# Patient Record
Sex: Female | Born: 1975 | Race: Black or African American | Hispanic: No | Marital: Single | Smoking: Former smoker
Health system: Southern US, Community
[De-identification: ages and names within clinical notes are randomized; demographics above are authoritative.]

## PROBLEM LIST (undated history)

## (undated) DIAGNOSIS — I10 Essential (primary) hypertension: Secondary | ICD-10-CM

## (undated) DIAGNOSIS — C189 Malignant neoplasm of colon, unspecified: Secondary | ICD-10-CM

## (undated) DIAGNOSIS — N200 Calculus of kidney: Secondary | ICD-10-CM

## (undated) DIAGNOSIS — C801 Malignant (primary) neoplasm, unspecified: Secondary | ICD-10-CM

## (undated) HISTORY — PX: URETERAL STENT PLACEMENT: SHX822

---

## 2017-10-06 DIAGNOSIS — I10 Essential (primary) hypertension: Secondary | ICD-10-CM

## 2017-10-06 HISTORY — DX: Essential (primary) hypertension: I10

## 2018-01-07 ENCOUNTER — Encounter (HOSPITAL_COMMUNITY): Payer: Self-pay | Admitting: Emergency Medicine

## 2018-01-07 ENCOUNTER — Other Ambulatory Visit: Payer: Self-pay

## 2018-01-07 ENCOUNTER — Emergency Department (HOSPITAL_COMMUNITY): Payer: Medicaid Other

## 2018-01-07 ENCOUNTER — Inpatient Hospital Stay (HOSPITAL_COMMUNITY)
Admission: EM | Admit: 2018-01-07 | Discharge: 2018-01-10 | DRG: 872 | Disposition: A | Discharge status: Home / Self Care | Payer: Medicaid Other | Attending: Internal Medicine | Admitting: Internal Medicine

## 2018-01-07 ENCOUNTER — Inpatient Hospital Stay (HOSPITAL_COMMUNITY): Payer: Medicaid Other

## 2018-01-07 DIAGNOSIS — K5900 Constipation, unspecified: Secondary | ICD-10-CM | POA: Diagnosis present

## 2018-01-07 DIAGNOSIS — R739 Hyperglycemia, unspecified: Secondary | ICD-10-CM | POA: Diagnosis present

## 2018-01-07 DIAGNOSIS — Z833 Family history of diabetes mellitus: Secondary | ICD-10-CM | POA: Diagnosis not present

## 2018-01-07 DIAGNOSIS — Z1619 Resistance to other specified beta lactam antibiotics: Secondary | ICD-10-CM | POA: Diagnosis present

## 2018-01-07 DIAGNOSIS — I10 Essential (primary) hypertension: Secondary | ICD-10-CM | POA: Diagnosis present

## 2018-01-07 DIAGNOSIS — E876 Hypokalemia: Secondary | ICD-10-CM | POA: Diagnosis present

## 2018-01-07 DIAGNOSIS — N200 Calculus of kidney: Secondary | ICD-10-CM | POA: Diagnosis present

## 2018-01-07 DIAGNOSIS — N390 Urinary tract infection, site not specified: Secondary | ICD-10-CM

## 2018-01-07 DIAGNOSIS — N10 Acute tubulo-interstitial nephritis: Secondary | ICD-10-CM | POA: Diagnosis present

## 2018-01-07 DIAGNOSIS — B964 Proteus (mirabilis) (morganii) as the cause of diseases classified elsewhere: Secondary | ICD-10-CM | POA: Diagnosis present

## 2018-01-07 DIAGNOSIS — E869 Volume depletion, unspecified: Secondary | ICD-10-CM | POA: Diagnosis present

## 2018-01-07 DIAGNOSIS — R9431 Abnormal electrocardiogram [ECG] [EKG]: Secondary | ICD-10-CM

## 2018-01-07 DIAGNOSIS — F17210 Nicotine dependence, cigarettes, uncomplicated: Secondary | ICD-10-CM | POA: Diagnosis present

## 2018-01-07 DIAGNOSIS — A419 Sepsis, unspecified organism: Principal | ICD-10-CM | POA: Diagnosis present

## 2018-01-07 DIAGNOSIS — N12 Tubulo-interstitial nephritis, not specified as acute or chronic: Secondary | ICD-10-CM

## 2018-01-07 DIAGNOSIS — R000 Tachycardia, unspecified: Secondary | ICD-10-CM | POA: Diagnosis present

## 2018-01-07 DIAGNOSIS — R1084 Generalized abdominal pain: Secondary | ICD-10-CM | POA: Diagnosis present

## 2018-01-07 DIAGNOSIS — Z79899 Other long term (current) drug therapy: Secondary | ICD-10-CM

## 2018-01-07 DIAGNOSIS — Z720 Tobacco use: Secondary | ICD-10-CM | POA: Diagnosis present

## 2018-01-07 HISTORY — DX: Essential (primary) hypertension: I10

## 2018-01-07 HISTORY — DX: Abnormal electrocardiogram (ECG) (EKG): R94.31

## 2018-01-07 LAB — PROTIME-INR
INR: 1.2
Prothrombin Time: 15.1 seconds (ref 11.4–15.2)

## 2018-01-07 LAB — CBC WITH DIFFERENTIAL/PLATELET
Abs Immature Granulocytes: 0.17 10*3/uL — ABNORMAL HIGH (ref 0.00–0.07)
Basophils Absolute: 0 10*3/uL (ref 0.0–0.1)
Basophils Relative: 0 %
EOS ABS: 0.1 10*3/uL (ref 0.0–0.5)
Eosinophils Relative: 0 %
HCT: 41.2 % (ref 36.0–46.0)
Hemoglobin: 13.6 g/dL (ref 12.0–15.0)
Immature Granulocytes: 1 %
Lymphocytes Relative: 7 %
Lymphs Abs: 1.6 10*3/uL (ref 0.7–4.0)
MCH: 30.7 pg (ref 26.0–34.0)
MCHC: 33 g/dL (ref 30.0–36.0)
MCV: 93 fL (ref 80.0–100.0)
Monocytes Absolute: 1.3 10*3/uL — ABNORMAL HIGH (ref 0.1–1.0)
Monocytes Relative: 6 %
Neutro Abs: 19.1 10*3/uL — ABNORMAL HIGH (ref 1.7–7.7)
Neutrophils Relative %: 86 %
Platelets: 278 10*3/uL (ref 150–400)
RBC: 4.43 MIL/uL (ref 3.87–5.11)
RDW: 12.8 % (ref 11.5–15.5)
WBC: 22.2 10*3/uL — ABNORMAL HIGH (ref 4.0–10.5)
nRBC: 0 % (ref 0.0–0.2)

## 2018-01-07 LAB — URINALYSIS, ROUTINE W REFLEX MICROSCOPIC
Bilirubin Urine: NEGATIVE
GLUCOSE, UA: NEGATIVE mg/dL
Ketones, ur: 5 mg/dL — AB
NITRITE: NEGATIVE
Protein, ur: >=300 mg/dL — AB
RBC / HPF: >50 RBC/hpf — ABNORMAL HIGH (ref 0–5)
Specific Gravity, Urine: 1.02 (ref 1.005–1.030)
WBC, UA: >50 WBC/hpf — ABNORMAL HIGH (ref 0–5)
pH: 6 (ref 5.0–8.0)

## 2018-01-07 LAB — COMPREHENSIVE METABOLIC PANEL
ALK PHOS: 81 U/L (ref 38–126)
ALT: 14 U/L (ref 0–44)
AST: 17 U/L (ref 15–41)
Albumin: 4.2 g/dL (ref 3.5–5.0)
Anion gap: 14 (ref 5–15)
BUN: 11 mg/dL (ref 6–20)
CALCIUM: 9.5 mg/dL (ref 8.9–10.3)
CO2: 25 mmol/L (ref 22–32)
CREATININE: 0.72 mg/dL (ref 0.44–1.00)
Chloride: 97 mmol/L — ABNORMAL LOW (ref 98–111)
GFR calc Af Amer: >60 mL/min (ref >60)
GFR calc non Af Amer: >60 mL/min (ref >60)
Glucose, Bld: 146 mg/dL — ABNORMAL HIGH (ref 70–99)
Potassium: 2.9 mmol/L — ABNORMAL LOW (ref 3.5–5.1)
Sodium: 136 mmol/L (ref 135–145)
Total Bilirubin: 0.9 mg/dL (ref 0.3–1.2)
Total Protein: 8.1 g/dL (ref 6.5–8.1)

## 2018-01-07 LAB — I-STAT BETA HCG BLOOD, ED (MC, WL, AP ONLY): I-stat hCG, quantitative: <5 m[IU]/mL (ref <5)

## 2018-01-07 LAB — MAGNESIUM: Magnesium: 2 mg/dL (ref 1.7–2.4)

## 2018-01-07 LAB — I-STAT CG4 LACTIC ACID, ED: LACTIC ACID, VENOUS: 0.93 mmol/L (ref 0.5–1.9)

## 2018-01-07 LAB — APTT: aPTT: 32 seconds (ref 24–36)

## 2018-01-07 LAB — MRSA PCR SCREENING: MRSA by PCR: NEGATIVE

## 2018-01-07 LAB — ECHOCARDIOGRAM COMPLETE

## 2018-01-07 LAB — LIPASE, BLOOD: Lipase: 34 U/L (ref 11–51)

## 2018-01-07 LAB — PHOSPHORUS: Phosphorus: 3.3 mg/dL (ref 2.5–4.6)

## 2018-01-07 LAB — POC OCCULT BLOOD, ED: Fecal Occult Bld: NEGATIVE

## 2018-01-07 MED ORDER — ACETAMINOPHEN 650 MG RE SUPP
650.0000 mg | Freq: Four times a day (QID) | RECTAL | Status: DC | PRN
  Administered 2018-01-08: 650 mg via RECTAL
  Filled 2018-01-07: qty 1

## 2018-01-07 MED ORDER — KETOROLAC TROMETHAMINE 30 MG/ML IJ SOLN
30.0000 mg | Freq: Once | INTRAMUSCULAR | Status: AC
  Administered 2018-01-07: 30 mg via INTRAVENOUS
  Filled 2018-01-07: qty 1

## 2018-01-07 MED ORDER — IBUPROFEN 200 MG PO TABS
600.0000 mg | ORAL_TABLET | Freq: Once | ORAL | Status: AC
  Administered 2018-01-07: 600 mg via ORAL
  Filled 2018-01-07: qty 3

## 2018-01-07 MED ORDER — HYDRALAZINE HCL 20 MG/ML IJ SOLN
10.0000 mg | INTRAMUSCULAR | Status: DC | PRN
  Administered 2018-01-07 (×2): 10 mg via INTRAVENOUS
  Filled 2018-01-07 (×2): qty 1

## 2018-01-07 MED ORDER — MORPHINE SULFATE (PF) 4 MG/ML IV SOLN
4.0000 mg | Freq: Once | INTRAVENOUS | Status: AC
  Administered 2018-01-07: 4 mg via INTRAVENOUS
  Filled 2018-01-07: qty 1

## 2018-01-07 MED ORDER — POTASSIUM CHLORIDE IN NACL 40-0.9 MEQ/L-% IV SOLN
INTRAVENOUS | Status: AC
  Administered 2018-01-07: 125 mL/h via INTRAVENOUS
  Filled 2018-01-07 (×2): qty 1000

## 2018-01-07 MED ORDER — HEPARIN SODIUM (PORCINE) 5000 UNIT/ML IJ SOLN
5000.0000 [IU] | Freq: Three times a day (TID) | INTRAMUSCULAR | Status: DC
  Administered 2018-01-07 – 2018-01-10 (×8): 5000 [IU] via SUBCUTANEOUS
  Filled 2018-01-07 (×8): qty 1

## 2018-01-07 MED ORDER — SODIUM CHLORIDE 0.9 % IV BOLUS
500.0000 mL | Freq: Once | INTRAVENOUS | Status: AC
  Administered 2018-01-07: 500 mL via INTRAVENOUS

## 2018-01-07 MED ORDER — SODIUM CHLORIDE 0.9 % IV BOLUS
2000.0000 mL | Freq: Once | INTRAVENOUS | Status: AC
  Administered 2018-01-07: 2000 mL via INTRAVENOUS

## 2018-01-07 MED ORDER — SODIUM CHLORIDE 0.9 % IV SOLN
1.0000 g | Freq: Once | INTRAVENOUS | Status: AC
  Administered 2018-01-07: 1 g via INTRAVENOUS
  Filled 2018-01-07: qty 10

## 2018-01-07 MED ORDER — SODIUM CHLORIDE (PF) 0.9 % IJ SOLN
INTRAMUSCULAR | Status: AC
  Filled 2018-01-07: qty 50

## 2018-01-07 MED ORDER — PROMETHAZINE HCL 25 MG/ML IJ SOLN
12.5000 mg | INTRAMUSCULAR | Status: DC | PRN
  Administered 2018-01-07: 25 mg via INTRAVENOUS
  Administered 2018-01-07 – 2018-01-10 (×4): 12.5 mg via INTRAVENOUS
  Filled 2018-01-07 (×6): qty 1

## 2018-01-07 MED ORDER — METOCLOPRAMIDE HCL 5 MG/ML IJ SOLN
5.0000 mg | Freq: Once | INTRAMUSCULAR | Status: AC
  Administered 2018-01-07: 5 mg via INTRAVENOUS
  Filled 2018-01-07: qty 2

## 2018-01-07 MED ORDER — HYDRALAZINE HCL 20 MG/ML IJ SOLN
10.0000 mg | Freq: Once | INTRAMUSCULAR | Status: AC
  Administered 2018-01-07: 10 mg via INTRAVENOUS
  Filled 2018-01-07: qty 1

## 2018-01-07 MED ORDER — SODIUM CHLORIDE 0.9 % IV SOLN
2.0000 g | INTRAVENOUS | Status: DC
  Administered 2018-01-08 – 2018-01-09 (×2): 2 g via INTRAVENOUS
  Filled 2018-01-07 (×2): qty 2

## 2018-01-07 MED ORDER — MORPHINE SULFATE (PF) 4 MG/ML IV SOLN
4.0000 mg | INTRAVENOUS | Status: DC | PRN
  Administered 2018-01-08: 1 mg via INTRAVENOUS
  Administered 2018-01-08: 4 mg via INTRAVENOUS
  Filled 2018-01-07 (×3): qty 1

## 2018-01-07 MED ORDER — IOPAMIDOL (ISOVUE-300) INJECTION 61%
INTRAVENOUS | Status: AC
  Filled 2018-01-07: qty 100

## 2018-01-07 MED ORDER — SODIUM CHLORIDE 0.9 % IV SOLN
1.0000 g | Freq: Once | INTRAVENOUS | Status: AC
  Administered 2018-01-07: 1 g via INTRAVENOUS
  Filled 2018-01-07: qty 1

## 2018-01-07 MED ORDER — SODIUM CHLORIDE 0.9 % IV BOLUS
1000.0000 mL | Freq: Once | INTRAVENOUS | Status: AC
  Administered 2018-01-07: 1000 mL via INTRAVENOUS

## 2018-01-07 MED ORDER — FAMOTIDINE IN NACL 20-0.9 MG/50ML-% IV SOLN
20.0000 mg | Freq: Once | INTRAVENOUS | Status: AC
  Administered 2018-01-07: 20 mg via INTRAVENOUS
  Filled 2018-01-07: qty 50

## 2018-01-07 MED ORDER — ONDANSETRON HCL 4 MG/2ML IJ SOLN
4.0000 mg | Freq: Once | INTRAMUSCULAR | Status: AC
  Administered 2018-01-07: 4 mg via INTRAVENOUS
  Filled 2018-01-07: qty 2

## 2018-01-07 MED ORDER — POTASSIUM CHLORIDE 10 MEQ/100ML IV SOLN
10.0000 meq | INTRAVENOUS | Status: AC
  Administered 2018-01-07 (×4): 10 meq via INTRAVENOUS
  Filled 2018-01-07 (×4): qty 100

## 2018-01-07 MED ORDER — ACETAMINOPHEN 325 MG PO TABS
650.0000 mg | ORAL_TABLET | Freq: Four times a day (QID) | ORAL | Status: DC | PRN
  Administered 2018-01-07 – 2018-01-09 (×5): 650 mg via ORAL
  Filled 2018-01-07 (×5): qty 2

## 2018-01-07 MED ORDER — METOPROLOL TARTRATE 5 MG/5ML IV SOLN
2.5000 mg | Freq: Once | INTRAVENOUS | Status: AC
  Administered 2018-01-07: 2.5 mg via INTRAVENOUS
  Filled 2018-01-07: qty 5

## 2018-01-07 MED ORDER — IOPAMIDOL (ISOVUE-300) INJECTION 61%
100.0000 mL | Freq: Once | INTRAVENOUS | Status: AC | PRN
  Administered 2018-01-07: 100 mL via INTRAVENOUS

## 2018-01-07 MED ORDER — PROMETHAZINE HCL 25 MG/ML IJ SOLN
12.5000 mg | Freq: Once | INTRAMUSCULAR | Status: AC
  Administered 2018-01-07: 12.5 mg via INTRAVENOUS
  Filled 2018-01-07: qty 1

## 2018-01-07 MED ORDER — MAGNESIUM SULFATE 2 GM/50ML IV SOLN
2.0000 g | Freq: Once | INTRAVENOUS | Status: AC
  Administered 2018-01-07: 2 g via INTRAVENOUS
  Filled 2018-01-07: qty 50

## 2018-01-07 MED ORDER — AMLODIPINE BESYLATE 10 MG PO TABS
10.0000 mg | ORAL_TABLET | Freq: Every day | ORAL | Status: DC
  Administered 2018-01-07 – 2018-01-10 (×4): 10 mg via ORAL
  Filled 2018-01-07 (×4): qty 1

## 2018-01-07 NOTE — ED Notes (Signed)
Patient ambulatory to restroom with minimal assist.

## 2018-01-07 NOTE — Progress Notes (Signed)
Pt spiked a fever of 102.4 with HR in the 140s and feeling very nauseas. Tylenol not due until 0030. On call mid-level was paged and put in orders for a 500cc NS bolus, 600mg  of ibuprofen, and 2.5mg  of metoprolol. All orders carried out and ice packs applied. Will continue to monitor.

## 2018-01-07 NOTE — H&P (Signed)
History and Physical    April [name] PPJ:[id number] DOB: 1975-07-10 DOA: 01/07/2018  PCP: Patient, No Pcp Per   Patient coming from: Home.  I have personally briefly reviewed patient's old medical records in [place]  Chief Complaint: Abdominal pain and vomiting.  HPI: April [name] is a 42 y.o. female with medical history significant of hypertension, tobacco use who is coming to the emergency department with complaints of abdominal and flank pain since Saturday, associated with constipation, which was followed by loose stools with bloody mucus contents.  She has been having dysuria and frequency.  She has also have fever and over 20 episodes of vomiting in the past 3 days.  Vomit content is just clear liquid.  She she is tired, fatigue and malaise.  She has not being able to sleep much and has not been able to eat in the past 3 days.  She has coronal headache.  She denies dyspnea, chest pain, but complains of palpitations, dizziness and diaphoresis.  No PND, orthopnea or recent pitting edema of the lower extremities.  She denies polyuria, polydipsia, polyphagia or blurred vision.  ED Course: Initial vital signs temperature 100.4 F,, later he was 103.2 F, initial pulse 136, respirations 20, blood pressure 171/127 mmHg and O2 sat 98% on room air.  The patient received at least 2000 mL NS bolus, morphine 4 mg IVP, ceftriaxone 1 g IVPB, Zofran 4 mg IVP x1, metoclopramide 5 mg IVP x1 and I added hydralazine 10 mg IVP x1 and Toradol 30 mg IVP x1.  Urinalysis showed cloudy urine, large hemoglobinuria, large proteinuria, large leukocyte esterase, more than 50 RBC and more than 50 WBC with many bacteria on microscopic examination.  Her CBC showed a White count was 22.2, hemoglobin 13.6 g/dL and platelets 278.  I-STAT hCG was negative.  Fecal occult blood was negative.  Lipase was normal.  CMP shows a potassium of 2.9 and chloride 97 mmol/L.  Glucose is 146 mg/dL, all other values are  within normal limits.  Imaging: CT abdomen pelvis showed large calculus in the right renal pelvis, likely intermittent obstructing with the patient erect.  There is bilateral nephrolithiasis.  There is no evidence of ureteral calculus.  Signs of fall: Pyelonephritis on right kidney.  There is no drainable abscess identified.  There is mild sigmoid colon diverticulosis and wall thickening without surrounding inflammation.  Please see images and full radiology report for for further detail.  Review of Systems: As per HPI otherwise 10 point review of systems negative.   Past Medical History:  Diagnosis Date  . Hypertension     Past Surgical History:  Procedure Laterality Date  . CESAREAN SECTION     x2  . URETERAL STENT PLACEMENT       reports that she has been smoking. She has been smoking about 0.50 packs per day. She has never used smokeless tobacco. She reports current alcohol use. She reports previous drug use.  No Known Allergies  Family History  Problem Relation Age of Onset  . Hypertension Mother   . Diabetes Mellitus II Father   . Stroke Father   . Ulcers Sister    Prior to Admission medications   Medication Sig Start Date End Date Taking? Authorizing Provider  acetaminophen (TYLENOL) 325 MG tablet Take 650 mg by mouth every 6 (six) hours as needed for mild pain or headache.   Yes [provider]  amLODipine (NORVASC) 10 MG tablet Take 10 mg by mouth daily.   Yes  [provider]  dimenhyDRINATE (DRAMAMINE) 50 MG tablet Take 50 mg by mouth every 8 (eight) hours as needed for nausea.   Yes [provider]  etonogestrel (NEXPLANON) 68 MG IMPL implant 1 each by Subdermal route once.   Yes [provider]  Simethicone (GAS RELIEF PO) Take 1 capsule by mouth as needed (gas relief).   Yes [provider]    Physical Exam: Vitals:   01/07/18 0939 01/07/18 1002 01/07/18 1030 01/07/18 1100  BP: (!) 191/120 (!) 175/107 (!) 177/114 (!)  174/118  Pulse: (!) 122 (!) 122 (!) 118 (!) 131  Resp: 20 (!) 25 (!) 26 (!) 28  Temp: (!) 103.2 F (39.6 C)     TempSrc: Oral     SpO2: 97% 98% 97% 95%    Constitutional: Febrile, looks acutely ill. Eyes: PERRL, lids and conjunctivae injected. ENMT: Mucous membranes are dry.  Posterior pharynx clear of any exudate or lesions. Neck: normal, supple, no masses, no thyromegaly Respiratory: clear to auscultation bilaterally, no wheezing, no crackles. Normal respiratory effort. No accessory muscle use.  Cardiovascular: Tachycardic at 120 bpm, no murmurs / rubs / gallops. No extremity edema. 2+ pedal pulses. No carotid bruits.  Abdomen: Soft, positive diffuse tenderness, positive RUQ tenderness, no guarding or rebound, no masses palpated. No hepatosplenomegaly. Bowel sounds positive.  Musculoskeletal: no clubbing / cyanosis.  Good ROM, no contractures. Normal muscle tone.  Skin: no rashes, lesions, ulcers on limited dermatological examination. Neurologic: CN 2-12 grossly intact. Sensation intact, DTR normal. Strength 5/5 in all 4.  Psychiatric: Normal judgment and insight. Alert and oriented x 3. Normal mood.   Labs on Admission: I have personally reviewed following labs and imaging studies  CBC: Recent Labs  Lab 01/07/18 0724  WBC 22.2*  NEUTROABS 19.1*  HGB 13.6  HCT 41.2  MCV 93.0  PLT 644   Basic Metabolic Panel: Recent Labs  Lab 01/07/18 0724  NA 136  K 2.9*  CL 97*  CO2 25  GLUCOSE 146*  BUN 11  CREATININE 0.72  CALCIUM 9.5  MG 2.0  PHOS 3.3   GFR: CrCl cannot be calculated (Unknown ideal weight.). Liver Function Tests: Recent Labs  Lab 01/07/18 0724  AST 17  ALT 14  ALKPHOS 81  BILITOT 0.9  PROT 8.1  ALBUMIN 4.2   Recent Labs  Lab 01/07/18 0724  LIPASE 34   No results for input(s): AMMONIA in the last 168 hours. Coagulation Profile: No results for input(s): INR, PROTIME in the last 168 hours. Cardiac Enzymes: No results for input(s): CKTOTAL,  CKMB, CKMBINDEX, TROPONINI in the last 168 hours. BNP (last 3 results) No results for input(s): PROBNP in the last 8760 hours. HbA1C: No results for input(s): HGBA1C in the last 72 hours. CBG: No results for input(s): GLUCAP in the last 168 hours. Lipid Profile: No results for input(s): CHOL, HDL, LDLCALC, TRIG, CHOLHDL, LDLDIRECT in the last 72 hours. Thyroid Function Tests: No results for input(s): TSH, T4TOTAL, FREET4, T3FREE, THYROIDAB in the last 72 hours. Anemia Panel: No results for input(s): VITAMINB12, FOLATE, FERRITIN, TIBC, IRON, RETICCTPCT in the last 72 hours. Urine analysis:    Component Value Date/Time   COLORURINE YELLOW 01/07/2018 0730   APPEARANCEUR CLOUDY (A) 01/07/2018 0730   LABSPEC 1.020 01/07/2018 0730   PHURINE 6.0 01/07/2018 0730   GLUCOSEU NEGATIVE 01/07/2018 0730   HGBUR LARGE (A) 01/07/2018 0730   BILIRUBINUR NEGATIVE 01/07/2018 0730   KETONESUR 5 (A) 01/07/2018 0730   PROTEINUR >=300 (A)  01/07/2018 0730   NITRITE NEGATIVE 01/07/2018 0730   LEUKOCYTESUR LARGE (A) 01/07/2018 0730    Radiological Exams on Admission: Ct Abdomen Pelvis W Contrast  Result Date: 01/07/2018 CLINICAL DATA:  Right flank pain and fever for 5 days. Nausea and vomiting. EXAM: CT ABDOMEN AND PELVIS WITH CONTRAST TECHNIQUE: Multidetector CT imaging of the abdomen and pelvis was performed using the standard protocol following bolus administration of intravenous contrast. CONTRAST:  130mL ISOVUE-300 IOPAMIDOL (ISOVUE-300) INJECTION 61% COMPARISON:  None. FINDINGS: Lower chest: Clear lung bases. No significant pleural or pericardial effusion. Hepatobiliary: The liver is normal in density without suspicious focal abnormality. No evidence of gallstones, gallbladder wall thickening or biliary dilatation. Pancreas: Unremarkable. No pancreatic ductal dilatation or surrounding inflammatory changes. Spleen: Normal in size without focal abnormality. Adrenals/Urinary Tract: Both adrenal glands  appear normal. There is bilateral nephrolithiasis. Largest calculus on the left is in the upper pole, measuring 11 mm on image 19/2. On the right, there are interpolar calculi measuring 10 mm on image 24/2 and 7 mm on image 31/2. There is a large calculus in the right renal pelvis, measuring up to 20 mm on coronal image 42/4. There is no significant hydronephrosis or delay in contrast excretion, although there is asymmetric perinephric soft tissue stranding on the right and heterogeneous low density in the lower pole of the right kidney, most likely reflecting focal pyelonephritis. There are underlying small renal cysts bilaterally. No evidence of ureteral or bladder calculus. The bladder appears normal. Stomach/Bowel: No enteric contrast was administered. The stomach is decompressed. The small bowel, appendix and proximal colon appear normal. There are mild diverticular changes and mild wall thickening in the sigmoid colon, but no surrounding inflammation. Vascular/Lymphatic: There are no enlarged abdominal or pelvic lymph nodes. Mild aortoiliac atherosclerosis. Reproductive: There are multiple prominent parametrial vessels with asymmetric dilatation of the left ovarian vein. The uterus and ovaries otherwise appear normal. No evidence of adnexal mass. Other: Small umbilical hernia containing only fat.  No ascites. Musculoskeletal: No acute osseous findings. There is a mild convex left scoliosis. 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 IMPRESSION: 1. Large calculus in the right renal pelvis, likely intermittently obstructive with the patient erect. Bilateral nephrolithiasis. No evidence of ureteral calculus. 2. Heterogeneous low density in the lower pole of the right kidney and adjacent perinephric soft tissue stranding, likely focal pyelonephritis. No drainable abscess identified at this time. 3. Mild sigmoid colon diverticulosis  and wall thickening without surrounding inflammation. 4. 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 Electronically Signed   By: Richardean Sale M.D.   On: 01/07/2018 09:54    EKG: Independently reviewed.  Vent. rate 120 BPM PR interval * ms QRS duration 99 ms QT/QTc 343/485 ms P-R-T axes 80 80 12 Sinus tachycardia LAE, consider biatrial enlargement Left ventricular hypertrophy  Assessment/Plan Principal Problem:   Sepsis secondary to UTI ([place]) Admit to stepdown/inpatient. Continue IV fluids. Antipyretic PRN. Antiemetics as needed. Analgesics as needed. Continue ceftriaxone 1 g IVPB every 24 hours. Follow-up blood culture and sensitivity. Follow-up urine culture and sensitivity. Urology to see for urolithiasis.  Active Problems:   Hypertension Resume amlodipine 10 mg p.o. daily. Hydralazine as needed. Monitor blood pressure.    Sinus tachycardia Secondary to fever and volume depletion. Continue IV fluids and antipyretics PRN. Continue cardio monitoring. Check echo.    Hypokalemia Replacing. Follow-up potassium level.    Abnormal EKG Check echocardiogram.    Hyperglycemia Check  hemoglobin A1c in a.m.    DVT prophylaxis: Lovenox SQ. Code Status: Full code. Family Communication: Disposition Plan: Admit for IV fluids, IV antibiotics urology consultation. Consults called: Urology (Dr. Nicolette Bang). Admission status: Inpatient/stepdown.   Reubin [place] MD [facility]  If 7PM-7AM, please contact night-coverage [email or web address] Password [facility]  01/07/2018, 11:24 AM   This document was prepared using Dragon voice recognition software and may contain some unintended transcription errors.

## 2018-01-07 NOTE — Progress Notes (Signed)
  Echocardiogram 2D Echocardiogram has been performed.  Darlina Sicilian M 01/07/2018, 2:02 PM

## 2018-01-07 NOTE — ED Notes (Addendum)
First set of cultures collected before antibiotic started from left AC. 5cc in blue and 5cc in red. No orders at this time. Md made aware.

## 2018-01-07 NOTE — ED Notes (Signed)
Report given to Katelyn, RN.

## 2018-01-07 NOTE — ED Notes (Signed)
Patient called out c/o of nausea that has not subsided with medication. Md made aware.

## 2018-01-07 NOTE — ED Notes (Signed)
ED TO INPATIENT HANDOFF REPORT  Name/Age/Gender April [name] 42 y.o. female  Code Status   Home/SNF/Other Home  Chief Complaint ABDOMINAL PAIN;EMESIS  Level of Care/Admitting Diagnosis ED Disposition    ED Disposition Condition [facility] Area: [place] [[id number]]  Level of Care: Stepdown [14]  Admit to SDU based on following criteria: Hemodynamic compromise or significant risk of instability:  Patient requiring short term acute titration and management of vasoactive drips, and invasive monitoring (i.e., CVP and Arterial line).  Diagnosis: Sepsis secondary to UTI [facility]) [[id number]]  Admitting Physician: Reubin [place] [[id number]]  Attending Physician: Reubin [place] [[id number]]  Estimated length of stay: past midnight tomorrow  Certification:: I certify this patient will need inpatient services for at least 2 midnights  PT Class (Do Not Modify): Inpatient [101]  PT Acc Code (Do Not Modify): Private [1]       Medical History Past Medical History:  Diagnosis Date  . Hypertension     Allergies No Known Allergies  IV Location/Drains/Wounds Patient Lines/Drains/Airways Status   Active Line/Drains/Airways    Name:   Placement date:   Placement time:   Site:   Days:   Peripheral IV 01/07/18 Left Antecubital   01/07/18    0730    Antecubital   less than 1          Labs/Imaging Results for orders placed or performed during the hospital encounter of 01/07/18 (from the past 48 hour(s))  Comprehensive metabolic panel     Status: Abnormal   Collection Time: 01/07/18  7:24 AM  Result Value Ref Range   Sodium 136 135 - 145 mmol/L   Potassium 2.9 (L) 3.5 - 5.1 mmol/L   Chloride 97 (L) 98 - 111 mmol/L   CO2 25 22 - 32 mmol/L   Glucose, Bld 146 (H) 70 - 99 mg/dL   BUN 11 6 - 20 mg/dL   Creatinine, Ser 0.72 0.44 - 1.00 mg/dL   Calcium 9.5 8.9 - 10.3 mg/dL   Total Protein 8.1 6.5 - 8.1 g/dL   Albumin 4.2 3.5 - 5.0 g/dL   AST 17  15 - 41 U/L   ALT 14 0 - 44 U/L   Alkaline Phosphatase 81 38 - 126 U/L   Total Bilirubin 0.9 0.3 - 1.2 mg/dL   GFR calc non Af Amer >60 >60 mL/min   GFR calc Af Amer >60 >60 mL/min   Anion gap 14 5 - 15    Comment: Performed at [facility], [place] [address], [place], [place] [address]  CBC with Differential/Platelet     Status: Abnormal   Collection Time: 01/07/18  7:24 AM  Result Value Ref Range   WBC 22.2 (H) 4.0 - 10.5 K/uL   RBC 4.43 3.87 - 5.11 MIL/uL   Hemoglobin 13.6 12.0 - 15.0 g/dL   HCT 41.2 36.0 - 46.0 %   MCV 93.0 80.0 - 100.0 fL   MCH 30.7 26.0 - 34.0 pg   MCHC 33.0 30.0 - 36.0 g/dL   RDW 12.8 11.5 - 15.5 %   Platelets 278 150 - 400 K/uL   nRBC 0.0 0.0 - 0.2 %   Neutrophils Relative % 86 %   Neutro Abs 19.1 (H) 1.7 - 7.7 K/uL   Lymphocytes Relative 7 %   Lymphs Abs 1.6 0.7 - 4.0 K/uL   Monocytes Relative 6 %   Monocytes Absolute 1.3 (H) 0.1 - 1.0 K/uL   Eosinophils Relative 0 %  Eosinophils Absolute 0.1 0.0 - 0.5 K/uL   Basophils Relative 0 %   Basophils Absolute 0.0 0.0 - 0.1 K/uL   Immature Granulocytes 1 %   Abs Immature Granulocytes 0.17 (H) 0.00 - 0.07 K/uL    Comment: Performed at [facility], [place] [address] [place], [place] [address]  Lipase, blood     Status: None   Collection Time: 01/07/18  7:24 AM  Result Value Ref Range   Lipase 34 11 - 51 U/L    Comment: Performed at [facility], [place] [address] [place], [place] [address]  Magnesium     Status: None   Collection Time: 01/07/18  7:24 AM  Result Value Ref Range   Magnesium 2.0 1.7 - 2.4 mg/dL    Comment: Performed at [facility], [place] [address] [place], [place] [address]  Phosphorus     Status: None   Collection Time: 01/07/18  7:24 AM  Result Value Ref Range   Phosphorus 3.3 2.5 - 4.6 mg/dL    Comment: Performed at [facility], [place] [address], [place], [place] [address]  Urinalysis, Routine w  reflex microscopic     Status: Abnormal   Collection Time: 01/07/18  7:30 AM  Result Value Ref Range   Color, Urine YELLOW YELLOW   APPearance CLOUDY (A) CLEAR   Specific Gravity, Urine 1.020 1.005 - 1.030   pH 6.0 5.0 - 8.0   Glucose, UA NEGATIVE NEGATIVE mg/dL   Hgb urine dipstick LARGE (A) NEGATIVE   Bilirubin Urine NEGATIVE NEGATIVE   Ketones, ur 5 (A) NEGATIVE mg/dL   Protein, ur >=300 (A) NEGATIVE mg/dL   Nitrite NEGATIVE NEGATIVE   Leukocytes, UA LARGE (A) NEGATIVE   RBC / HPF >50 (H) 0 - 5 RBC/hpf   WBC, UA >50 (H) 0 - 5 WBC/hpf   Bacteria, UA MANY (A) NONE SEEN   Squamous Epithelial / LPF 0-5 0 - 5   Mucus PRESENT     Comment: Performed at [facility], [place] [address], [place], [place] [address]  I-Stat Beta hCG blood, ED ([facility], [facility], [facility] only)     Status: None   Collection Time: 01/07/18  7:35 AM  Result Value Ref Range   I-stat hCG, quantitative <5.0 <5 mIU/mL   Comment 3            Comment:   GEST. AGE      CONC.  (mIU/mL)   <=1 WEEK        5 - 50     2 WEEKS       50 - 500     3 WEEKS       100 - 10,000     4 WEEKS     1,000 - 30,000        FEMALE AND NON-PREGNANT FEMALE:     LESS THAN 5 mIU/mL   POC occult blood, ED Provider will collect     Status: None   Collection Time: 01/07/18  7:36 AM  Result Value Ref Range   Fecal Occult Bld NEGATIVE NEGATIVE   Ct Abdomen Pelvis W Contrast  Result Date: 01/07/2018 CLINICAL DATA:  Right flank pain and fever for 5 days. Nausea and vomiting. EXAM: CT ABDOMEN AND PELVIS WITH CONTRAST TECHNIQUE: Multidetector CT imaging of the abdomen and pelvis was performed using the standard protocol following bolus administration of intravenous contrast. CONTRAST:  19mL ISOVUE-300 IOPAMIDOL (ISOVUE-300) INJECTION 61% COMPARISON:  None. FINDINGS:  Lower chest: Clear lung bases. No significant pleural or pericardial effusion. Hepatobiliary: The liver is normal in density without suspicious focal abnormality. No evidence of  gallstones, gallbladder wall thickening or biliary dilatation. Pancreas: Unremarkable. No pancreatic ductal dilatation or surrounding inflammatory changes. Spleen: Normal in size without focal abnormality. Adrenals/Urinary Tract: Both adrenal glands appear normal. There is bilateral nephrolithiasis. Largest calculus on the left is in the upper pole, measuring 11 mm on image 19/2. On the right, there are interpolar calculi measuring 10 mm on image 24/2 and 7 mm on image 31/2. There is a large calculus in the right renal pelvis, measuring up to 20 mm on coronal image 42/4. There is no significant hydronephrosis or delay in contrast excretion, although there is asymmetric perinephric soft tissue stranding on the right and heterogeneous low density in the lower pole of the right kidney, most likely reflecting focal pyelonephritis. There are underlying small renal cysts bilaterally. No evidence of ureteral or bladder calculus. The bladder appears normal. Stomach/Bowel: No enteric contrast was administered. The stomach is decompressed. The small bowel, appendix and proximal colon appear normal. There are mild diverticular changes and mild wall thickening in the sigmoid colon, but no surrounding inflammation. Vascular/Lymphatic: There are no enlarged abdominal or pelvic lymph nodes. Mild aortoiliac atherosclerosis. Reproductive: There are multiple prominent parametrial vessels with asymmetric dilatation of the left ovarian vein. The uterus and ovaries otherwise appear normal. No evidence of adnexal mass. Other: Small umbilical hernia containing only fat.  No ascites. Musculoskeletal: No acute osseous findings. There is a mild convex left scoliosis. 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 IMPRESSION: 1. Large calculus in the right renal pelvis, likely intermittently obstructive with the patient erect. Bilateral nephrolithiasis. No  evidence of ureteral calculus. 2. Heterogeneous low density in the lower pole of the right kidney and adjacent perinephric soft tissue stranding, likely focal pyelonephritis. No drainable abscess identified at this time. 3. Mild sigmoid colon diverticulosis and wall thickening without surrounding inflammation. 4. 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 Electronically Signed   By: Richardean Sale M.D.   On: 01/07/2018 09:54   EKG Interpretation  Date/Time:  Thursday January 07 2018 07:04:23 EST Ventricular Rate:  120 PR Interval:    QRS Duration: 99 QT Interval:  343 QTC Calculation: 485 R Axis:   80 Text Interpretation:  Sinus tachycardia LAE, consider biatrial enlargement Left ventricular hypertrophy No old tracing to compare Confirmed by Pen Mar, Inocente Salles [phone]) on 01/07/2018 7:34:33 AM   Pending Labs Unresulted Labs (From admission, onward)    Start     Ordered   01/07/18 0943  Urine Culture  ONCE - STAT,   STAT    Question:  Patient immune status  Answer:  Normal   01/07/18 0942          Vitals/Pain Today's Vitals   01/07/18 0939 01/07/18 1002 01/07/18 1030 01/07/18 1100  BP: (!) 191/120 (!) 175/107 (!) 177/114 (!) 174/118  Pulse: (!) 122 (!) 122 (!) 118 (!) 131  Resp: 20 (!) 25 (!) 26 (!) 28  Temp: (!) 103.2 F (39.6 C)     TempSrc: Oral     SpO2: 97% 98% 97% 95%  PainSc:        Isolation Precautions No active isolations  Medications Medications  iopamidol (ISOVUE-300) 61 % injection (has no administration in time range)  sodium chloride (PF) 0.9 % injection (has no administration in time range)  0.9 %  NaCl with KCl 40 mEq / L  infusion (has no administration in time range)  potassium chloride 10 mEq in 100 mL IVPB (has no administration in time range)  ketorolac (TORADOL) 30 MG/ML injection 30 mg (has no administration in time range)  promethazine (PHENERGAN) injection 12.5 mg (has no administration in time range)  morphine 4 MG/ML injection  4 mg (has no administration in time range)  famotidine (PEPCID) IVPB 20 mg premix (has no administration in time range)  hydrALAZINE (APRESOLINE) injection 10 mg (has no administration in time range)  sodium chloride 0.9 % bolus 2,000 mL (0 mLs Intravenous Stopped 01/07/18 1013)  morphine 4 MG/ML injection 4 mg (4 mg Intravenous Given 01/07/18 0735)  metoCLOPramide (REGLAN) injection 5 mg (5 mg Intravenous Given 01/07/18 0734)  metoCLOPramide (REGLAN) injection 5 mg (5 mg Intravenous Given 01/07/18 0829)  iopamidol (ISOVUE-300) 61 % injection 100 mL (100 mLs Intravenous Contrast Given 01/07/18 0917)  cefTRIAXone (ROCEPHIN) 1 g in sodium chloride 0.9 % 100 mL IVPB (0 g Intravenous Stopped 01/07/18 1120)  ondansetron (ZOFRAN) injection 4 mg (4 mg Intravenous Given 01/07/18 1020)    Mobility walks with person assist

## 2018-01-07 NOTE — ED Triage Notes (Addendum)
Pt reports abd pain onset 4 days ago with n/v starting 2 days ago pt reports last BM Friday past. Pt c/o freq urination

## 2018-01-07 NOTE — ED Provider Notes (Addendum)
[facility] DEPT Provider Note   CSN: [id number] Arrival date & time: 01/07/18  8546     History   Chief Complaint Chief Complaint  Patient presents with  . Abdominal Pain    HPI April [name] is a 42 y.o. female.  Complains of diffuse abdominal pain and bilateral flank pain onset 5 days ago.  Pain is constant made worse with changing positions not improved by anything she is treated herself with simethicone, Dramamine and "IBOGARD", without relief.  Associated symptoms include multiple liquid bowel movements which she describes as blood mixed with mucus and multiple episodes of vomiting her last bowel movement was 1 or 2 days ago.  She last vomited this morning.  Vomiting clear material.  She denies cough or shortness of breath.  No other associated symptoms.  She reports she has not eaten in the past 3 days  HPI  Past Medical History:  Diagnosis Date  . Hypertension     There are no active problems to display for this patient.   History reviewed. No pertinent surgical history.  Past surgical history cesarean section   OB History   No obstetric history on file.      Home Medications    Prior to Admission medications   Not on File    Family History History reviewed. No pertinent family history.  Social History Social History   Tobacco Use  . Smoking status: Current Every Day Smoker    Packs/day: 0.50  . Smokeless tobacco: Never Used  Substance Use Topics  . Alcohol use: Yes  . Drug use: Not Currently   Also marijuana use no other drug use  Allergies   Patient has no known allergies.   Review of Systems Review of Systems  Constitutional: Positive for appetite change.  HENT: Negative.   Respiratory: Negative.   Cardiovascular: Negative.   Gastrointestinal: Positive for abdominal pain, blood in stool, nausea and vomiting.  Genitourinary: Positive for flank pain.       Amenorrheic for 4 years  Skin: Negative.     Neurological: Negative.   Psychiatric/Behavioral: Negative.   All other systems reviewed and are negative.    Physical Exam Updated Vital Signs BP (!) 171/127 (BP Location: Left Arm)   Pulse (!) 136   Temp (!) 100.4 F (38 C)   Resp 20   LMP 01/07/2006   SpO2 98%   Physical Exam Vitals signs and nursing note reviewed. Exam conducted with a chaperone present.  Constitutional:      Appearance: She is well-developed. She is not toxic-appearing.  HENT:     Head: Normocephalic and atraumatic.     Comments: Mucous membranes dry Eyes:     Conjunctiva/sclera: Conjunctivae normal.     Pupils: Pupils are equal, round, and reactive to light.  Neck:     Musculoskeletal: Neck supple.     Thyroid: No thyromegaly.     Trachea: No tracheal deviation.  Cardiovascular:     Rate and Rhythm: Regular rhythm.     Heart sounds: No murmur.     Comments: Tachycardic Pulmonary:     Effort: Pulmonary effort is normal.     Breath sounds: Normal breath sounds.  Abdominal:     General: Bowel sounds are normal. There is no distension.     Palpations: Abdomen is soft.     Tenderness: There is abdominal tenderness. There is no guarding.     Comments: Diffuse tenderness  Genitourinary:    Comments: Bilateral flank tenderness  right side greater than left.  Rectum normal tone brown stool no gross blood Musculoskeletal: Normal range of motion.        General: No tenderness.  Skin:    General: Skin is warm and dry.     Findings: No rash.  Neurological:     Mental Status: She is alert.     Coordination: Coordination normal.  Psychiatric:        Mood and Affect: Mood normal.      ED Treatments / Results  Labs (all labs ordered are listed, but only abnormal results are displayed) Labs Reviewed  URINALYSIS, ROUTINE W REFLEX MICROSCOPIC  COMPREHENSIVE METABOLIC PANEL  CBC WITH DIFFERENTIAL/PLATELET  LIPASE, BLOOD  I-STAT BETA HCG BLOOD, ED ([facility], [facility], [facility] ONLY)  POC OCCULT BLOOD, ED     EKG EKG Interpretation  Date/Time:  Thursday January 07 2018 07:04:23 EST Ventricular Rate:  120 PR Interval:    QRS Duration: 99 QT Interval:  343 QTC Calculation: 485 R Axis:   80 Text Interpretation:  Sinus tachycardia LAE, consider biatrial enlargement Left ventricular hypertrophy No old tracing to compare Confirmed by Vaughn, Inocente Salles [phone]) on 01/07/2018 7:34:33 AM   Radiology No results found.  Procedures Procedures (including critical care time)  Medications Ordered in ED Medications  sodium chloride 0.9 % bolus 2,000 mL (has no administration in time range)  morphine 4 MG/ML injection 4 mg (has no administration in time range)  metoCLOPramide (REGLAN) injection 5 mg (has no administration in time range)   8:50 AM pain and nausea improved after treatment with intravenous opioids, antiemetics and IV normal saline bolus.  10:25 AM pain is controlled.  She is requesting additional medicine for nausea.  Intravenous Zofran ordered Results for orders placed or performed during the hospital encounter of 01/07/18  Urinalysis, Routine w reflex microscopic  Result Value Ref Range   Color, Urine YELLOW YELLOW   APPearance CLOUDY (A) CLEAR   Specific Gravity, Urine 1.020 1.005 - 1.030   pH 6.0 5.0 - 8.0   Glucose, UA NEGATIVE NEGATIVE mg/dL   Hgb urine dipstick LARGE (A) NEGATIVE   Bilirubin Urine NEGATIVE NEGATIVE   Ketones, ur 5 (A) NEGATIVE mg/dL   Protein, ur >=300 (A) NEGATIVE mg/dL   Nitrite NEGATIVE NEGATIVE   Leukocytes, UA LARGE (A) NEGATIVE   RBC / HPF >50 (H) 0 - 5 RBC/hpf   WBC, UA >50 (H) 0 - 5 WBC/hpf   Bacteria, UA MANY (A) NONE SEEN   Squamous Epithelial / LPF 0-5 0 - 5   Mucus PRESENT   Comprehensive metabolic panel  Result Value Ref Range   Sodium 136 135 - 145 mmol/L   Potassium 2.9 (L) 3.5 - 5.1 mmol/L   Chloride 97 (L) 98 - 111 mmol/L   CO2 25 22 - 32 mmol/L   Glucose, Bld 146 (H) 70 - 99 mg/dL   BUN 11 6 - 20 mg/dL   Creatinine, Ser 0.72  0.44 - 1.00 mg/dL   Calcium 9.5 8.9 - 10.3 mg/dL   Total Protein 8.1 6.5 - 8.1 g/dL   Albumin 4.2 3.5 - 5.0 g/dL   AST 17 15 - 41 U/L   ALT 14 0 - 44 U/L   Alkaline Phosphatase 81 38 - 126 U/L   Total Bilirubin 0.9 0.3 - 1.2 mg/dL   GFR calc non Af Amer >60 >60 mL/min   GFR calc Af Amer >60 >60 mL/min   Anion gap 14 5 - 15  CBC with  Differential/Platelet  Result Value Ref Range   WBC 22.2 (H) 4.0 - 10.5 K/uL   RBC 4.43 3.87 - 5.11 MIL/uL   Hemoglobin 13.6 12.0 - 15.0 g/dL   HCT 41.2 36.0 - 46.0 %   MCV 93.0 80.0 - 100.0 fL   MCH 30.7 26.0 - 34.0 pg   MCHC 33.0 30.0 - 36.0 g/dL   RDW 12.8 11.5 - 15.5 %   Platelets 278 150 - 400 K/uL   nRBC 0.0 0.0 - 0.2 %   Neutrophils Relative % 86 %   Neutro Abs 19.1 (H) 1.7 - 7.7 K/uL   Lymphocytes Relative 7 %   Lymphs Abs 1.6 0.7 - 4.0 K/uL   Monocytes Relative 6 %   Monocytes Absolute 1.3 (H) 0.1 - 1.0 K/uL   Eosinophils Relative 0 %   Eosinophils Absolute 0.1 0.0 - 0.5 K/uL   Basophils Relative 0 %   Basophils Absolute 0.0 0.0 - 0.1 K/uL   Immature Granulocytes 1 %   Abs Immature Granulocytes 0.17 (H) 0.00 - 0.07 K/uL  Lipase, blood  Result Value Ref Range   Lipase 34 11 - 51 U/L  Magnesium  Result Value Ref Range   Magnesium 2.0 1.7 - 2.4 mg/dL  Phosphorus  Result Value Ref Range   Phosphorus 3.3 2.5 - 4.6 mg/dL  I-Stat Beta hCG blood, ED ([facility], [facility], [facility] only)  Result Value Ref Range   I-stat hCG, quantitative <5.0 <5 mIU/mL   Comment 3          POC occult blood, ED Provider will collect  Result Value Ref Range   Fecal Occult Bld NEGATIVE NEGATIVE   Ct Abdomen Pelvis W Contrast  Result Date: 01/07/2018 CLINICAL DATA:  Right flank pain and fever for 5 days. Nausea and vomiting. EXAM: CT ABDOMEN AND PELVIS WITH CONTRAST TECHNIQUE: Multidetector CT imaging of the abdomen and pelvis was performed using the standard protocol following bolus administration of intravenous contrast. CONTRAST:  151mL ISOVUE-300 IOPAMIDOL (ISOVUE-300)  INJECTION 61% COMPARISON:  None. FINDINGS: Lower chest: Clear lung bases. No significant pleural or pericardial effusion. Hepatobiliary: The liver is normal in density without suspicious focal abnormality. No evidence of gallstones, gallbladder wall thickening or biliary dilatation. Pancreas: Unremarkable. No pancreatic ductal dilatation or surrounding inflammatory changes. Spleen: Normal in size without focal abnormality. Adrenals/Urinary Tract: Both adrenal glands appear normal. There is bilateral nephrolithiasis. Largest calculus on the left is in the upper pole, measuring 11 mm on image 19/2. On the right, there are interpolar calculi measuring 10 mm on image 24/2 and 7 mm on image 31/2. There is a large calculus in the right renal pelvis, measuring up to 20 mm on coronal image 42/4. There is no significant hydronephrosis or delay in contrast excretion, although there is asymmetric perinephric soft tissue stranding on the right and heterogeneous low density in the lower pole of the right kidney, most likely reflecting focal pyelonephritis. There are underlying small renal cysts bilaterally. No evidence of ureteral or bladder calculus. The bladder appears normal. Stomach/Bowel: No enteric contrast was administered. The stomach is decompressed. The small bowel, appendix and proximal colon appear normal. There are mild diverticular changes and mild wall thickening in the sigmoid colon, but no surrounding inflammation. Vascular/Lymphatic: There are no enlarged abdominal or pelvic lymph nodes. Mild aortoiliac atherosclerosis. Reproductive: There are multiple prominent parametrial vessels with asymmetric dilatation of the left ovarian vein. The uterus and ovaries otherwise appear normal. No evidence of adnexal mass. Other: Small umbilical hernia containing  only fat.  No ascites. Musculoskeletal: No acute osseous findings. There is a mild convex left scoliosis. 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 IMPRESSION: 1. Large calculus in the right renal pelvis, likely intermittently obstructive with the patient erect. Bilateral nephrolithiasis. No evidence of ureteral calculus. 2. Heterogeneous low density in the lower pole of the right kidney and adjacent perinephric soft tissue stranding, likely focal pyelonephritis. No drainable abscess identified at this time. 3. Mild sigmoid colon diverticulosis and wall thickening without surrounding inflammation. 4. 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 Electronically Signed   By: Richardean Sale M.D.   On: 01/07/2018 09:54   Initial Impression / Assessment and Plan / ED Course  I have reviewed the triage vital signs and the nursing notes.  Pertinent labs & imaging results that were available during my care of the patient were reviewed by me and considered in my medical decision making (see chart for details).    Lab work is consistent with urinary tract infection and microscopic hematuria, as well as hypokalemia. I consulted Dr. Alyson Ingles from urology service who will see patient in hospital.  I also consulted Dr. Olevia Bowens from hospitalist service who will arrange for admission.   Final Clinical Impressions(s) / ED Diagnoses  Diagnosis #1 acute pyelonephritis with sepsis #2 hypokalemia #3 kidney stone #4 elevated blood pressure Final diagnoses:  None  CRITICAL CARE Performed by: Orlie Dakin Total critical care time: 30 minutes Critical care time was exclusive of separately billable procedures and treating other patients. Critical care was necessary to treat or prevent imminent or life-threatening deterioration. Critical care was time spent personally by me on the following activities: development of treatment plan with patient and/or surrogate as well as nursing, discussions with consultants, evaluation of patient's response to treatment, examination of  patient, obtaining history from patient or surrogate, ordering and performing treatments and interventions, ordering and review of laboratory studies, ordering and review of radiographic studies, pulse oximetry and re-evaluation of patient's condition.  ED Discharge Orders    None       Orlie Dakin, MD 01/07/18 1114    Orlie Dakin, MD 01/07/18 1116

## 2018-01-07 NOTE — ED Notes (Signed)
EKG given to EDP,Glick,MD., for review. 

## 2018-01-07 NOTE — ED Notes (Signed)
Patient transported to CT 

## 2018-01-08 LAB — CBC WITH DIFFERENTIAL/PLATELET
Abs Immature Granulocytes: 0.07 10*3/uL (ref 0.00–0.07)
Basophils Absolute: 0 10*3/uL (ref 0.0–0.1)
Basophils Relative: 0 %
Eosinophils Absolute: 0 10*3/uL (ref 0.0–0.5)
Eosinophils Relative: 0 %
HCT: 39.5 % (ref 36.0–46.0)
Hemoglobin: 12.6 g/dL (ref 12.0–15.0)
Immature Granulocytes: 0 %
Lymphocytes Relative: 10 %
Lymphs Abs: 1.6 10*3/uL (ref 0.7–4.0)
MCH: 30.4 pg (ref 26.0–34.0)
MCHC: 31.9 g/dL (ref 30.0–36.0)
MCV: 95.2 fL (ref 80.0–100.0)
Monocytes Absolute: 1.2 10*3/uL — ABNORMAL HIGH (ref 0.1–1.0)
Monocytes Relative: 7 %
Neutro Abs: 13.3 10*3/uL — ABNORMAL HIGH (ref 1.7–7.7)
Neutrophils Relative %: 83 %
Platelets: 218 10*3/uL (ref 150–400)
RBC: 4.15 MIL/uL (ref 3.87–5.11)
RDW: 13.1 % (ref 11.5–15.5)
WBC: 16.1 10*3/uL — ABNORMAL HIGH (ref 4.0–10.5)
nRBC: 0 % (ref 0.0–0.2)

## 2018-01-08 LAB — COMPREHENSIVE METABOLIC PANEL
ALT: 13 U/L (ref 0–44)
AST: 16 U/L (ref 15–41)
Albumin: 3.2 g/dL — ABNORMAL LOW (ref 3.5–5.0)
Alkaline Phosphatase: 67 U/L (ref 38–126)
Anion gap: 12 (ref 5–15)
BUN: 9 mg/dL (ref 6–20)
CO2: 25 mmol/L (ref 22–32)
Calcium: 8.7 mg/dL — ABNORMAL LOW (ref 8.9–10.3)
Chloride: 102 mmol/L (ref 98–111)
Creatinine, Ser: 0.62 mg/dL (ref 0.44–1.00)
GFR calc non Af Amer: >60 mL/min (ref >60)
Glucose, Bld: 105 mg/dL — ABNORMAL HIGH (ref 70–99)
Potassium: 3.2 mmol/L — ABNORMAL LOW (ref 3.5–5.1)
SODIUM: 139 mmol/L (ref 135–145)
Total Bilirubin: 0.4 mg/dL (ref 0.3–1.2)
Total Protein: 7.2 g/dL (ref 6.5–8.1)

## 2018-01-08 LAB — HIV ANTIBODY (ROUTINE TESTING W REFLEX): HIV Screen 4th Generation wRfx: NONREACTIVE

## 2018-01-08 MED ORDER — KETOROLAC TROMETHAMINE 15 MG/ML IJ SOLN
15.0000 mg | Freq: Once | INTRAMUSCULAR | Status: AC
  Administered 2018-01-08: 15 mg via INTRAVENOUS
  Filled 2018-01-08: qty 1

## 2018-01-08 MED ORDER — POTASSIUM CHLORIDE CRYS ER 20 MEQ PO TBCR
40.0000 meq | EXTENDED_RELEASE_TABLET | Freq: Once | ORAL | Status: AC
  Administered 2018-01-08: 40 meq via ORAL
  Filled 2018-01-08: qty 2

## 2018-01-08 MED ORDER — HYDROCODONE-ACETAMINOPHEN 5-325 MG PO TABS
1.0000 | ORAL_TABLET | Freq: Four times a day (QID) | ORAL | Status: DC | PRN
  Administered 2018-01-08 – 2018-01-10 (×6): 1 via ORAL
  Filled 2018-01-08 (×6): qty 1

## 2018-01-08 MED ORDER — SODIUM CHLORIDE 0.9 % IV SOLN
INTRAVENOUS | Status: DC | PRN
  Administered 2018-01-08: 1000 mL via INTRAVENOUS

## 2018-01-08 NOTE — Progress Notes (Signed)
[facility] Progress Note  Patient: April [name] RCV:[id number]   PCP: Patient, No Pcp Per DOB: [birth year]   DOA: 01/07/2018   DOS: 01/08/2018   Date of Service: the patient was seen and examined on 01/08/2018  Brief hospital course: Pt. with PMH of renal stone; admitted on 01/07/2018, presented with complaint of abdominal pain, was found to have pyonephritis and renal stone. Currently further plan is continue IV antibiotics.  Subjective: Feeling better, pain is still better.  Nausea resolved.  No vomiting.  Passing gas.  Still febrile.  Assessment and Plan:  Sepsis secondary to UTI ([place]) Admit to stepdown/inpatient. Continue IV fluids. Antipyretic PRN. Antiemetics as needed. Analgesics as needed. Continue ceftriaxone 1 g IVPB every 24 hours. Follow-up blood culture and sensitivity. Follow-up urine culture and sensitivity. Urology to see for urolithiasis.  Active Problems:   Hypertension Resume amlodipine 10 mg p.o. daily. Hydralazine as needed. Monitor blood pressure.    Sinus tachycardia Secondary to fever and volume depletion. Continue IV fluids and antipyretics PRN.    Hypokalemia Replacing. Follow-up potassium level.    Abnormal EKG EKG shows possible left ventricular hypertrophy. Echocardiogram is already performed.  We will follow-up on the results.  Diet: regular diet DVT Prophylaxis: subcutaneous Heparin  Advance goals of care discussion: home  Family Communication: no family was present at bedside, at the time of interview.   Disposition:  Discharge to home.  Consultants: urology Procedures: none  Scheduled Meds: . amLODipine  10 mg Oral Daily  . heparin  5,000 Units Subcutaneous Q8H   Continuous Infusions: . sodium chloride 1,000 mL (01/08/18 0041)  . cefTRIAXone (ROCEPHIN)  IV Stopped (01/08/18 1028)   PRN Meds: sodium chloride, acetaminophen **OR** acetaminophen, hydrALAZINE, HYDROcodone-acetaminophen, morphine injection,  promethazine Antibiotics: Anti-infectives (From admission, onward)   Start     Dose/Rate Route Frequency Ordered Stop   01/08/18 1000  cefTRIAXone (ROCEPHIN) 2 g in sodium chloride 0.9 % 100 mL IVPB     2 g 200 mL/hr over 30 Minutes Intravenous Every 24 hours 01/07/18 1206     01/07/18 1300  cefTRIAXone (ROCEPHIN) 1 g in sodium chloride 0.9 % 100 mL IVPB     1 g 200 mL/hr over 30 Minutes Intravenous  Once 01/07/18 1206 01/07/18 1456   01/07/18 0945  cefTRIAXone (ROCEPHIN) 1 g in sodium chloride 0.9 % 100 mL IVPB     1 g 200 mL/hr over 30 Minutes Intravenous  Once 01/07/18 0942 01/07/18 1120       Objective: Physical Exam: Vitals:   01/08/18 1332 01/08/18 1600 01/08/18 1655 01/08/18 1700  BP: (!) 140/95 (!) 141/97  (!) 147/102  Pulse: (!) 117 (!) 124  (!) 123  Resp: 20 (!) 25  20  Temp:   (!) 100.7 F (38.2 C)   TempSrc:   Oral   SpO2: 98% 98%  94%  Weight:      Height:        Intake/Output Summary (Last 24 hours) at 01/08/2018 1750 Last data filed at 01/08/2018 1656 Gross per 24 hour  Intake 2667.14 ml  Output 2075 ml  Net 592.14 ml   Filed Weights   01/08/18 0700  Weight: 65.6 kg   General: Alert, Awake and Oriented to Time, Place and Person. Appear in mild distress, affect appropriate Eyes: PERRL, Conjunctiva normal ENT: Oral Mucosa clear moist. Neck: no JVD, no Abnormal Mass Or lumps Cardiovascular: S1 and S2 Present, no Murmur, Peripheral Pulses Present Respiratory: normal respiratory effort, Bilateral Air entry equal and  Decreased, no use of accessory muscle, Clear to Auscultation, no Crackles, no wheezes Abdomen: Bowel Sound present, Soft and no tenderness, no hernia Skin: no redness, no Rash, no induration Extremities: no Pedal edema, no calf tenderness Neurologic: Grossly no focal neuro deficit. Bilaterally Equal motor strength  Data Reviewed: CBC: Recent Labs  Lab 01/07/18 0724 01/08/18 0258  WBC 22.2* 16.1*  NEUTROABS 19.1* 13.3*  HGB 13.6 12.6    HCT 41.2 39.5  MCV 93.0 95.2  PLT 278 756   Basic Metabolic Panel: Recent Labs  Lab 01/07/18 0724 01/08/18 0258  NA 136 139  K 2.9* 3.2*  CL 97* 102  CO2 25 25  GLUCOSE 146* 105*  BUN 11 9  CREATININE 0.72 0.62  CALCIUM 9.5 8.7*  MG 2.0  --   PHOS 3.3  --     Liver Function Tests: Recent Labs  Lab 01/07/18 0724 01/08/18 0258  AST 17 16  ALT 14 13  ALKPHOS 81 67  BILITOT 0.9 0.4  PROT 8.1 7.2  ALBUMIN 4.2 3.2*   Recent Labs  Lab 01/07/18 0724  LIPASE 34   No results for input(s): AMMONIA in the last 168 hours. Coagulation Profile: Recent Labs  Lab 01/07/18 1254  INR 1.20   Cardiac Enzymes: No results for input(s): CKTOTAL, CKMB, CKMBINDEX, TROPONINI in the last 168 hours. BNP (last 3 results) No results for input(s): PROBNP in the last 8760 hours. CBG: No results for input(s): GLUCAP in the last 168 hours. Studies: No results found.   Time spent: 35 minutes  Author: Berle Mull, MD [facility] Pager: [phone] 01/08/2018 5:50 PM  Between 7PM-7AM, please contact night-coverage at [email or web address], password [facility]

## 2018-01-08 NOTE — Consult Note (Signed)
Urology Consult  Referring physician: Dr. Olevia Bowens Reason for referral: bilateral renal calculi, pyelonephritis  Chief Complaint: left flank pain  History of Present Illness: Ms April [name] is a 42yo with a hx of nephrolithiasis who presented to the ER yesterday with bilateral flank pain and fevers of 103 which have been present for 2 days. Her last stone event was in 2001.  She currently has sharp, intermittent, mild to moderate nonradiaitng left flank pain and occasional right flank pain. She has associated nausea but no vomiting. NO other associated signs/symptoms. No exacerbating/allevaiting events. UA is concerning for infection. Urine culture pending Ct showed bilateral nonobstructing renal calculi and right pyelonephritis.  Past Medical History:  Diagnosis Date  . Hypertension    Past Surgical History:  Procedure Laterality Date  . CESAREAN SECTION     x2  . URETERAL STENT PLACEMENT      Medications: I have reviewed the patient's current medications. Allergies: No Known Allergies  Family History  Problem Relation Age of Onset  . Hypertension Mother   . Diabetes Mellitus II Father   . Stroke Father   . Ulcers Sister    Social History:  reports that she has been smoking. She has been smoking about 0.50 packs per day. She has never used smokeless tobacco. She reports current alcohol use. She reports previous drug use.  Review of Systems  Constitutional: Positive for chills and fever.  Gastrointestinal: Positive for nausea.  Genitourinary: Positive for flank pain.  All other systems reviewed and are negative.   Physical Exam:  Vital signs in last 24 hours: Temp:  [98.6 F (37 C)-103.2 F (39.6 C)] 98.6 F (37 C) (12/27 0336) Pulse Rate:  [87-145] 92 (12/27 0600) Resp:  [16-38] 20 (12/27 0600) BP: (122-191)/(80-129) 136/96 (12/27 0600) SpO2:  [95 %-99 %] 98 % (12/27 0600) Weight:  [65.6 kg] 65.6 kg (12/27 0700) Physical Exam  Constitutional: She is oriented to person,  place, and time. She appears well-developed and well-nourished.  HENT:  Head: Normocephalic and atraumatic.  Eyes: Pupils are equal, round, and reactive to light. EOM are normal.  Neck: Normal range of motion. No thyromegaly present.  Cardiovascular: Normal rate and regular rhythm.  Respiratory: Effort normal. No respiratory distress.  GI: Soft. She exhibits no distension.  Musculoskeletal: Normal range of motion.        General: No edema.  Neurological: She is alert and oriented to person, place, and time.  Skin: Skin is warm and dry.  Psychiatric: She has a normal mood and affect. Her behavior is normal. Judgment and thought content normal.    Laboratory Data:  Results for orders placed or performed during the hospital encounter of 01/07/18 (from the past 72 hour(s))  Comprehensive metabolic panel     Status: Abnormal   Collection Time: 01/07/18  7:24 AM  Result Value Ref Range   Sodium 136 135 - 145 mmol/L   Potassium 2.9 (L) 3.5 - 5.1 mmol/L   Chloride 97 (L) 98 - 111 mmol/L   CO2 25 22 - 32 mmol/L   Glucose, Bld 146 (H) 70 - 99 mg/dL   BUN 11 6 - 20 mg/dL   Creatinine, Ser 0.72 0.44 - 1.00 mg/dL   Calcium 9.5 8.9 - 10.3 mg/dL   Total Protein 8.1 6.5 - 8.1 g/dL   Albumin 4.2 3.5 - 5.0 g/dL   AST 17 15 - 41 U/L   ALT 14 0 - 44 U/L   Alkaline Phosphatase 81 38 - 126 U/L  Total Bilirubin 0.9 0.3 - 1.2 mg/dL   GFR calc non Af Amer >60 >60 mL/min   GFR calc Af Amer >60 >60 mL/min   Anion gap 14 5 - 15    Comment: Performed at [facility], [place] [address] [place], [place] [address]  CBC with Differential/Platelet     Status: Abnormal   Collection Time: 01/07/18  7:24 AM  Result Value Ref Range   WBC 22.2 (H) 4.0 - 10.5 K/uL   RBC 4.43 3.87 - 5.11 MIL/uL   Hemoglobin 13.6 12.0 - 15.0 g/dL   HCT 41.2 36.0 - 46.0 %   MCV 93.0 80.0 - 100.0 fL   MCH 30.7 26.0 - 34.0 pg   MCHC 33.0 30.0 - 36.0 g/dL   RDW 12.8 11.5 - 15.5 %   Platelets 278 150 - 400 K/uL    nRBC 0.0 0.0 - 0.2 %   Neutrophils Relative % 86 %   Neutro Abs 19.1 (H) 1.7 - 7.7 K/uL   Lymphocytes Relative 7 %   Lymphs Abs 1.6 0.7 - 4.0 K/uL   Monocytes Relative 6 %   Monocytes Absolute 1.3 (H) 0.1 - 1.0 K/uL   Eosinophils Relative 0 %   Eosinophils Absolute 0.1 0.0 - 0.5 K/uL   Basophils Relative 0 %   Basophils Absolute 0.0 0.0 - 0.1 K/uL   Immature Granulocytes 1 %   Abs Immature Granulocytes 0.17 (H) 0.00 - 0.07 K/uL    Comment: Performed at [facility], [place] [address], [place], [place] [address]  Lipase, blood     Status: None   Collection Time: 01/07/18  7:24 AM  Result Value Ref Range   Lipase 34 11 - 51 U/L    Comment: Performed at [facility], [place] [address] [place], [place] [address]  Magnesium     Status: None   Collection Time: 01/07/18  7:24 AM  Result Value Ref Range   Magnesium 2.0 1.7 - 2.4 mg/dL    Comment: Performed at [facility], [place] [address] [place], [place] [address]  Phosphorus     Status: None   Collection Time: 01/07/18  7:24 AM  Result Value Ref Range   Phosphorus 3.3 2.5 - 4.6 mg/dL    Comment: Performed at [facility], [place] [address] [place], [place] [address]  Urinalysis, Routine w reflex microscopic     Status: Abnormal   Collection Time: 01/07/18  7:30 AM  Result Value Ref Range   Color, Urine YELLOW YELLOW   APPearance CLOUDY (A) CLEAR   Specific Gravity, Urine 1.020 1.005 - 1.030   pH 6.0 5.0 - 8.0   Glucose, UA NEGATIVE NEGATIVE mg/dL   Hgb urine dipstick LARGE (A) NEGATIVE   Bilirubin Urine NEGATIVE NEGATIVE   Ketones, ur 5 (A) NEGATIVE mg/dL   Protein, ur >=300 (A) NEGATIVE mg/dL   Nitrite NEGATIVE NEGATIVE   Leukocytes, UA LARGE (A) NEGATIVE   RBC / HPF >50 (H) 0 - 5 RBC/hpf   WBC, UA >50 (H) 0 - 5 WBC/hpf   Bacteria, UA MANY (A) NONE SEEN   Squamous Epithelial / LPF 0-5 0 - 5   Mucus PRESENT     Comment: Performed at [facility], [place] [address], [place], [place] [address]  I-Stat Beta hCG blood, ED [facility], [facility], AP only)     Status: None   Collection Time: 01/07/18  7:35 AM  Result Value Ref Range  I-stat hCG, quantitative <5.0 <5 mIU/mL   Comment 3            Comment:   GEST. AGE      CONC.  (mIU/mL)   <=1 WEEK        5 - 50     2 WEEKS       50 - 500     3 WEEKS       100 - 10,000     4 WEEKS     1,000 - 30,000        FEMALE AND NON-PREGNANT FEMALE:     LESS THAN 5 mIU/mL   POC occult blood, ED Provider will collect     Status: None   Collection Time: 01/07/18  7:36 AM  Result Value Ref Range   Fecal Occult Bld NEGATIVE NEGATIVE  I-Stat CG4 Lactic Acid, ED     Status: None   Collection Time: 01/07/18 11:43 AM  Result Value Ref Range   Lactic Acid, Venous 0.93 0.5 - 1.9 mmol/L  Protime-INR     Status: None   Collection Time: 01/07/18 12:54 PM  Result Value Ref Range   Prothrombin Time 15.1 11.4 - 15.2 seconds   INR 1.20     Comment: Performed at [facility], [place] [address], [place], [place] [address]  APTT     Status: None   Collection Time: 01/07/18 12:54 PM  Result Value Ref Range   aPTT 32 24 - 36 seconds    Comment: Performed at [facility], [place] [address] [place], [place] [address]  MRSA PCR Screening     Status: None   Collection Time: 01/07/18  1:40 PM  Result Value Ref Range   MRSA by PCR NEGATIVE NEGATIVE    Comment:        The GeneXpert MRSA Assay (FDA approved for NASAL specimens only), is one component of a comprehensive MRSA colonization surveillance program. It is not intended to diagnose MRSA infection nor to guide or monitor treatment for MRSA infections. Performed at [facility], [place] [address] [place], [place] [address]   CBC WITH DIFFERENTIAL     Status: Abnormal   Collection Time: 01/08/18  2:58 AM  Result Value Ref Range   WBC 16.1 (H) 4.0 - 10.5 K/uL   RBC 4.15 3.87 - 5.11 MIL/uL   Hemoglobin 12.6  12.0 - 15.0 g/dL   HCT 39.5 36.0 - 46.0 %   MCV 95.2 80.0 - 100.0 fL   MCH 30.4 26.0 - 34.0 pg   MCHC 31.9 30.0 - 36.0 g/dL   RDW 13.1 11.5 - 15.5 %   Platelets 218 150 - 400 K/uL   nRBC 0.0 0.0 - 0.2 %   Neutrophils Relative % 83 %   Neutro Abs 13.3 (H) 1.7 - 7.7 K/uL   Lymphocytes Relative 10 %   Lymphs Abs 1.6 0.7 - 4.0 K/uL   Monocytes Relative 7 %   Monocytes Absolute 1.2 (H) 0.1 - 1.0 K/uL   Eosinophils Relative 0 %   Eosinophils Absolute 0.0 0.0 - 0.5 K/uL   Basophils Relative 0 %   Basophils Absolute 0.0 0.0 - 0.1 K/uL   Immature Granulocytes 0 %   Abs Immature Granulocytes 0.07 0.00 - 0.07 K/uL    Comment: Performed at [facility], [place] [address] [place], [place] [address]  Comprehensive metabolic panel     Status: Abnormal   Collection Time: 01/08/18  2:58  AM  Result Value Ref Range   Sodium 139 135 - 145 mmol/L   Potassium 3.2 (L) 3.5 - 5.1 mmol/L   Chloride 102 98 - 111 mmol/L   CO2 25 22 - 32 mmol/L   Glucose, Bld 105 (H) 70 - 99 mg/dL   BUN 9 6 - 20 mg/dL   Creatinine, Ser 0.62 0.44 - 1.00 mg/dL   Calcium 8.7 (L) 8.9 - 10.3 mg/dL   Total Protein 7.2 6.5 - 8.1 g/dL   Albumin 3.2 (L) 3.5 - 5.0 g/dL   AST 16 15 - 41 U/L   ALT 13 0 - 44 U/L   Alkaline Phosphatase 67 38 - 126 U/L   Total Bilirubin 0.4 0.3 - 1.2 mg/dL   GFR calc non Af Amer >60 >60 mL/min   GFR calc Af Amer >60 >60 mL/min   Anion gap 12 5 - 15    Comment: Performed at [facility], [place] [address], [place], [place] [address]   Recent Results (from the past 240 hour(s))  MRSA PCR Screening     Status: None   Collection Time: 01/07/18  1:40 PM  Result Value Ref Range Status   MRSA by PCR NEGATIVE NEGATIVE Final    Comment:        The GeneXpert MRSA Assay (FDA approved for NASAL specimens only), is one component of a comprehensive MRSA colonization surveillance program. It is not intended to diagnose MRSA infection nor to guide or monitor treatment  for MRSA infections. Performed at [facility], [place] [address], [place], [place] [address]    Creatinine: Recent Labs    01/07/18 0724 01/08/18 0258  CREATININE 0.72 0.62   Baseline Creatinine: 0.6  Impression/Assessment:  42yo with bilateral nonobstructing renal calculi and pyelonephritis  Plan:  1. Pyelonephritis: Please continue broad spectrum antibiotics pending urine culture. She will need 14 days of culture specific antibiotics prior to any stone intervention 2. Bilateral nephrolithiasis: I dicussed the management of the calculi with the patient and given her large stone burden she would likely need a staged ureteroscopy. We will have her scheduled for her surgery as an outpatient  Nicolette Bang 01/08/2018, 8:01 AM

## 2018-01-09 DIAGNOSIS — D72829 Elevated white blood cell count, unspecified: Secondary | ICD-10-CM

## 2018-01-09 DIAGNOSIS — E876 Hypokalemia: Secondary | ICD-10-CM

## 2018-01-09 DIAGNOSIS — I10 Essential (primary) hypertension: Secondary | ICD-10-CM

## 2018-01-09 DIAGNOSIS — N10 Acute tubulo-interstitial nephritis: Secondary | ICD-10-CM

## 2018-01-09 LAB — CBC WITH DIFFERENTIAL/PLATELET
Abs Immature Granulocytes: 0.03 10*3/uL (ref 0.00–0.07)
BASOS PCT: 0 %
Basophils Absolute: 0 10*3/uL (ref 0.0–0.1)
Eosinophils Absolute: 0 10*3/uL (ref 0.0–0.5)
Eosinophils Relative: 0 %
HCT: 37.5 % (ref 36.0–46.0)
Hemoglobin: 12 g/dL (ref 12.0–15.0)
Immature Granulocytes: 0 %
Lymphocytes Relative: 23 %
Lymphs Abs: 2.5 10*3/uL (ref 0.7–4.0)
MCH: 30.2 pg (ref 26.0–34.0)
MCHC: 32 g/dL (ref 30.0–36.0)
MCV: 94.5 fL (ref 80.0–100.0)
MONO ABS: 1.2 10*3/uL — AB (ref 0.1–1.0)
MONOS PCT: 11 %
Neutro Abs: 7.3 10*3/uL (ref 1.7–7.7)
Neutrophils Relative %: 66 %
Platelets: 226 10*3/uL (ref 150–400)
RBC: 3.97 MIL/uL (ref 3.87–5.11)
RDW: 13 % (ref 11.5–15.5)
WBC: 11 10*3/uL — ABNORMAL HIGH (ref 4.0–10.5)
nRBC: 0 % (ref 0.0–0.2)

## 2018-01-09 LAB — URINE CULTURE
Culture: >=100000 — AB
SPECIAL REQUESTS: NORMAL

## 2018-01-09 MED ORDER — SODIUM CHLORIDE 0.9 % IV SOLN
3.0000 g | Freq: Four times a day (QID) | INTRAVENOUS | Status: DC
  Administered 2018-01-09 – 2018-01-10 (×4): 3 g via INTRAVENOUS
  Filled 2018-01-09 (×5): qty 3

## 2018-01-09 NOTE — Progress Notes (Signed)
Patient ID: April [name], female   DOB: January 15, 1975, 42 y.o.   MRN: [id number]  PROGRESS NOTE    Kailyn Vanderslice  AJG:[id number] DOB: [birth year] DOA: 01/07/2018 PCP: Patient, No Pcp Per   Brief Narrative:  42 year old female with history of renal stone was admitted on 01/07/2018 with abdominal pain and was found to have pyelonephritis and renal stone.  She was started on IV antibiotics.  Urology was consulted.   Assessment & Plan:   Principal Problem:   Sepsis secondary to UTI [facility]) Active Problems:   Hypertension   Sinus tachycardia   Hypokalemia   Abnormal EKG   Hyperglycemia   Tobacco use   Sepsis secondary to pyelonephritis -Still having fevers.  Antibiotic plan as below.  Hemodynamically improving.  Off IV fluids.  Acute pyelonephritis with bilateral nephrolithiasis -Urine culture is growing Proteus.  Sensitivities pending.  Urology consult appreciated: Recommend 14 days of antibiotics and outpatient follow-up in the office for further management of renal stones  -Continue Rocephin till we have the sensitivities back.  Leukocytosis -Improving.  Monitor  Hypokalemia -No labs available today.  Repeat a.m. labs  Hypertension -Blood pressure on the higher side.  Continue amlodipine.  Hydralazine as needed  DVT prophylaxis: Subcutaneous heparin Code Status: Full Family Communication: None at bedside Disposition Plan: Home tomorrow if fever free today  Consultants: Urology  Procedures: None  Antimicrobials: Rocephin from 01/07/2018 onwards   Subjective: Patient seen and examined at bedside.  She had fevers yesterday.  Complains of intermittent headache.  Currently not nauseous.  Complaining of intermittent lower abdominal pain but improving.  Objective: Vitals:   01/09/18 0430 01/09/18 0500 01/09/18 0823 01/09/18 0916  BP:  (!) 154/103  (!) 153/112  Pulse:      Resp:  (!) 22    Temp: 98 F (36.7 C)  99.4 F (37.4 C)   TempSrc: Axillary  Oral     SpO2:      Weight:      Height:        Intake/Output Summary (Last 24 hours) at 01/09/2018 3559 Last data filed at 01/09/2018 0400 Gross per 24 hour  Intake 720 ml  Output -  Net 720 ml   Filed Weights   01/08/18 0700  Weight: 65.6 kg    Examination:  General exam: Appears calm and comfortable  Respiratory system: Bilateral decreased breath sounds at bases Cardiovascular system: S1 & S2 heard, Rate controlled Gastrointestinal system: Abdomen is nondistended, soft and mildly tender in the left lower quadrant and suprapubic regions. Normal bowel sounds heard. Extremities: No cyanosis, clubbing, edema   Data Reviewed: I have personally reviewed following labs and imaging studies  CBC: Recent Labs  Lab 01/07/18 0724 01/08/18 0258 01/09/18 0311  WBC 22.2* 16.1* 11.0*  NEUTROABS 19.1* 13.3* 7.3  HGB 13.6 12.6 12.0  HCT 41.2 39.5 37.5  MCV 93.0 95.2 94.5  PLT 278 218 741   Basic Metabolic Panel: Recent Labs  Lab 01/07/18 0724 01/08/18 0258  NA 136 139  K 2.9* 3.2*  CL 97* 102  CO2 25 25  GLUCOSE 146* 105*  BUN 11 9  CREATININE 0.72 0.62  CALCIUM 9.5 8.7*  MG 2.0  --   PHOS 3.3  --    GFR: Estimated Creatinine Clearance: 77.5 mL/min (by C-G formula based on SCr of 0.62 mg/dL). Liver Function Tests: Recent Labs  Lab 01/07/18 0724 01/08/18 0258  AST 17 16  ALT 14 13  ALKPHOS 81 67  BILITOT 0.9 0.4  PROT  8.1 7.2  ALBUMIN 4.2 3.2*   Recent Labs  Lab 01/07/18 0724  LIPASE 34   No results for input(s): AMMONIA in the last 168 hours. Coagulation Profile: Recent Labs  Lab 01/07/18 1254  INR 1.20   Cardiac Enzymes: No results for input(s): CKTOTAL, CKMB, CKMBINDEX, TROPONINI in the last 168 hours. BNP (last 3 results) No results for input(s): PROBNP in the last 8760 hours. HbA1C: No results for input(s): HGBA1C in the last 72 hours. CBG: No results for input(s): GLUCAP in the last 168 hours. Lipid Profile: No results for input(s): CHOL, HDL,  LDLCALC, TRIG, CHOLHDL, LDLDIRECT in the last 72 hours. Thyroid Function Tests: No results for input(s): TSH, T4TOTAL, FREET4, T3FREE, THYROIDAB in the last 72 hours. Anemia Panel: No results for input(s): VITAMINB12, FOLATE, FERRITIN, TIBC, IRON, RETICCTPCT in the last 72 hours. Sepsis Labs: Recent Labs  Lab 01/07/18 1143  LATICACIDVEN 0.93    Recent Results (from the past 240 hour(s))  Urine Culture     Status: Abnormal (Preliminary result)   Collection Time: 01/07/18  7:30 AM  Result Value Ref Range Status   Specimen Description   Final    URINE, CLEAN CATCH Performed at [facility], [place] [address], [place], [place] [address]    Special Requests   Final    Normal Performed at [facility], [place] [address] [place], [place] [address]    Culture >=100,000 COLONIES/mL PROTEUS MIRABILIS (A)  Final   Report Status PENDING  Incomplete  MRSA PCR Screening     Status: None   Collection Time: 01/07/18  1:40 PM  Result Value Ref Range Status   MRSA by PCR NEGATIVE NEGATIVE Final    Comment:        The GeneXpert MRSA Assay (FDA approved for NASAL specimens only), is one component of a comprehensive MRSA colonization surveillance program. It is not intended to diagnose MRSA infection nor to guide or monitor treatment for MRSA infections. Performed at [facility], [place] [address], [place], [place] [address]          Radiology Studies: No results found.      Scheduled Meds: . amLODipine  10 mg Oral Daily  . heparin  5,000 Units Subcutaneous Q8H   Continuous Infusions: . sodium chloride 10 mL/hr at 01/08/18 2000  . cefTRIAXone (ROCEPHIN)  IV 2 g (01/09/18 0916)     LOS: 2 days        Aline August, MD [facility] Pager [phone]  If 7PM-7AM, please contact night-coverage [email or web address] Password [facility] 01/09/2018, 9:53 AM

## 2018-01-10 LAB — BASIC METABOLIC PANEL WITH GFR
Anion gap: 10 (ref 5–15)
BUN: 8 mg/dL (ref 6–20)
CO2: 29 mmol/L (ref 22–32)
Calcium: 8.5 mg/dL — ABNORMAL LOW (ref 8.9–10.3)
Chloride: 98 mmol/L (ref 98–111)
Creatinine, Ser: 0.49 mg/dL (ref 0.44–1.00)
GFR calc Af Amer: >60 mL/min
GFR calc non Af Amer: >60 mL/min
Glucose, Bld: 97 mg/dL (ref 70–99)
Potassium: 2.6 mmol/L — CL (ref 3.5–5.1)
Sodium: 137 mmol/L (ref 135–145)

## 2018-01-10 LAB — MAGNESIUM: Magnesium: 2.2 mg/dL (ref 1.7–2.4)

## 2018-01-10 LAB — CBC WITH DIFFERENTIAL/PLATELET
Abs Immature Granulocytes: 0.02 K/uL (ref 0.00–0.07)
Basophils Absolute: 0 K/uL (ref 0.0–0.1)
Basophils Relative: 0 %
Eosinophils Absolute: 0 K/uL (ref 0.0–0.5)
Eosinophils Relative: 1 %
HCT: 35.6 % — ABNORMAL LOW (ref 36.0–46.0)
Hemoglobin: 11.7 g/dL — ABNORMAL LOW (ref 12.0–15.0)
Immature Granulocytes: 0 %
Lymphocytes Relative: 24 %
Lymphs Abs: 1.9 K/uL (ref 0.7–4.0)
MCH: 31 pg (ref 26.0–34.0)
MCHC: 32.9 g/dL (ref 30.0–36.0)
MCV: 94.4 fL (ref 80.0–100.0)
Monocytes Absolute: 1 K/uL (ref 0.1–1.0)
Monocytes Relative: 14 %
Neutro Abs: 4.6 K/uL (ref 1.7–7.7)
Neutrophils Relative %: 61 %
Platelets: 258 K/uL (ref 150–400)
RBC: 3.77 MIL/uL — ABNORMAL LOW (ref 3.87–5.11)
RDW: 12.8 % (ref 11.5–15.5)
WBC: 7.6 K/uL (ref 4.0–10.5)
nRBC: 0 % (ref 0.0–0.2)

## 2018-01-10 MED ORDER — AMOXICILLIN-POT CLAVULANATE 875-125 MG PO TABS: 1.0000 | ORAL_TABLET | Freq: Two times a day (BID) | ORAL | 0 refills | Status: AC

## 2018-01-10 MED ORDER — POTASSIUM CHLORIDE CRYS ER 20 MEQ PO TBCR
40.0000 meq | EXTENDED_RELEASE_TABLET | ORAL | Status: AC
  Administered 2018-01-10 (×2): 40 meq via ORAL
  Filled 2018-01-10 (×2): qty 2

## 2018-01-10 MED ORDER — ONDANSETRON HCL 4 MG PO TABS: 4.0000 mg | ORAL_TABLET | Freq: Four times a day (QID) | ORAL | 0 refills | Status: AC | PRN

## 2018-01-10 MED ORDER — TRAMADOL HCL 50 MG PO TABS: 50.0000 mg | ORAL_TABLET | Freq: Four times a day (QID) | ORAL | 0 refills | Status: AC | PRN

## 2018-01-10 MED ORDER — FLUCONAZOLE 150 MG PO TABS: 150.0000 mg | ORAL_TABLET | Freq: Once | ORAL | 0 refills | Status: DC | PRN

## 2018-01-10 NOTE — Progress Notes (Signed)
Patient discharged to home with family, discharge instructions reviewed with patient who verbalized understanding. 

## 2018-01-10 NOTE — Discharge Summary (Signed)
Physician Discharge Summary  April [name] IWL:[id number] DOB: Jun 30, 1975 DOA: 01/07/2018  PCP: Patient, No Pcp Per  Admit date: 01/07/2018 Discharge date: 01/10/2018  Admitted From: Home Disposition:  Home  Recommendations for Outpatient Follow-up:  1. Follow up with PCP in 1week 2. Follow-up with urology as an outpatient 3. Follow-up in the ED if symptoms worsen or new appear   Home Health: No Equipment/Devices: None  Discharge Condition: Stable CODE STATUS: Full Diet recommendation: Heart Healthy   Brief/Interim Summary: 42 year old female with history of renal stone was admitted on 01/07/2018 with abdominal pain and was found to have pyelonephritis and renal stone.  She was started on IV antibiotics.  Urology was consulted.  She was initially started on Rocephin.  Urine culture grew Proteus resistant to Rocephin, antibiotics was switched to Unasyn.  She feels better and is not spiking temperature anymore.  Urology recommends 2 weeks antibiotic course and outpatient follow-up with urology.  Discharge home on oral Augmentin.  Discharge Diagnoses:  Principal Problem:   Sepsis secondary to UTI [facility]) Active Problems:   Hypertension   Sinus tachycardia   Hypokalemia   Abnormal EKG   Hyperglycemia   Tobacco use  Sepsis secondary to pyelonephritis -Sepsis has resolved.  Hemodynamically stable.  Antibiotic plan as below.  Treated with IV fluids  Acute pyelonephritis with bilateral nephrolithiasis -Urine culture is growing Proteus.  Patient was initially started on Rocephin.  Proteus was resistant to Rocephin.  She was switched to IV Unasyn.  Currently not spiking any temperatures. Urology consult appreciated: Recommend 14 days of antibiotics and outpatient follow-up in the office for further management of renal stones  -Discharge home on oral Augmentin to finish a 2-week course of total therapy.  Leukocytosis -Resolved  Hypokalemia -Replace prior to discharge.   Outpatient follow-up  Hypertension -Blood pressure on the higher side.  Continue amlodipine.  Outpatient follow-up.  Discharge Instructions  Discharge Instructions    Call MD for:  difficulty breathing, headache or visual disturbances   Complete by:  As directed    Call MD for:  extreme fatigue   Complete by:  As directed    Call MD for:  hives   Complete by:  As directed    Call MD for:  persistant dizziness or light-headedness   Complete by:  As directed    Call MD for:  persistant nausea and vomiting   Complete by:  As directed    Call MD for:  severe uncontrolled pain   Complete by:  As directed    Call MD for:  temperature >100.4   Complete by:  As directed    Diet - low sodium heart healthy   Complete by:  As directed    Increase activity slowly   Complete by:  As directed      Allergies as of 01/10/2018   No Known Allergies     Medication List    TAKE these medications   acetaminophen 325 MG tablet Commonly known as:  TYLENOL Take 650 mg by mouth every 6 (six) hours as needed for mild pain or headache.   amLODipine 10 MG tablet Commonly known as:  NORVASC Take 10 mg by mouth daily.   amoxicillin-clavulanate 875-125 MG tablet Commonly known as:  AUGMENTIN Take 1 tablet by mouth 2 (two) times daily for 12 days.   dimenhyDRINATE 50 MG tablet Commonly known as:  DRAMAMINE Take 50 mg by mouth every 8 (eight) hours as needed for nausea.   etonogestrel 68 MG Impl implant  Commonly known as:  NEXPLANON 1 each by Subdermal route once.   fluconazole 150 MG tablet Commonly known as:  DIFLUCAN Take 1 tablet (150 mg total) by mouth once as needed for up to 1 dose (for yeast infection).   GAS RELIEF PO Take 1 capsule by mouth as needed (gas relief).   ondansetron 4 MG tablet Commonly known as:  ZOFRAN Take 1 tablet (4 mg total) by mouth every 6 (six) hours as needed for nausea or vomiting.   traMADol 50 MG tablet Commonly known as:  ULTRAM Take 1 tablet  (50 mg total) by mouth every 6 (six) hours as needed for moderate pain.      Follow-up Information    PCP. Schedule an appointment as soon as possible for a visit in 1 week(s).        McKenzie, Candee Furbish, MD. Schedule an appointment as soon as possible for a visit in 1 week(s).   Specialty:  Urology Contact information: [place] [address] [phone]          No Known Allergies  Consultations:  Urology   Procedures/Studies: Ct Abdomen Pelvis W Contrast  Result Date: 01/07/2018 CLINICAL DATA:  Right flank pain and fever for 5 days. Nausea and vomiting. EXAM: CT ABDOMEN AND PELVIS WITH CONTRAST TECHNIQUE: Multidetector CT imaging of the abdomen and pelvis was performed using the standard protocol following bolus administration of intravenous contrast. CONTRAST:  122mL ISOVUE-300 IOPAMIDOL (ISOVUE-300) INJECTION 61% COMPARISON:  None. FINDINGS: Lower chest: Clear lung bases. No significant pleural or pericardial effusion. Hepatobiliary: The liver is normal in density without suspicious focal abnormality. No evidence of gallstones, gallbladder wall thickening or biliary dilatation. Pancreas: Unremarkable. No pancreatic ductal dilatation or surrounding inflammatory changes. Spleen: Normal in size without focal abnormality. Adrenals/Urinary Tract: Both adrenal glands appear normal. There is bilateral nephrolithiasis. Largest calculus on the left is in the upper pole, measuring 11 mm on image 19/2. On the right, there are interpolar calculi measuring 10 mm on image 24/2 and 7 mm on image 31/2. There is a large calculus in the right renal pelvis, measuring up to 20 mm on coronal image 42/4. There is no significant hydronephrosis or delay in contrast excretion, although there is asymmetric perinephric soft tissue stranding on the right and heterogeneous low density in the lower pole of the right kidney, most likely reflecting focal pyelonephritis. There are underlying small  renal cysts bilaterally. No evidence of ureteral or bladder calculus. The bladder appears normal. Stomach/Bowel: No enteric contrast was administered. The stomach is decompressed. The small bowel, appendix and proximal colon appear normal. There are mild diverticular changes and mild wall thickening in the sigmoid colon, but no surrounding inflammation. Vascular/Lymphatic: There are no enlarged abdominal or pelvic lymph nodes. Mild aortoiliac atherosclerosis. Reproductive: There are multiple prominent parametrial vessels with asymmetric dilatation of the left ovarian vein. The uterus and ovaries otherwise appear normal. No evidence of adnexal mass. Other: Small umbilical hernia containing only fat.  No ascites. Musculoskeletal: No acute osseous findings. There is a mild convex left scoliosis. 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 IMPRESSION: 1. Large calculus in the right renal pelvis, likely intermittently obstructive with the patient erect. Bilateral nephrolithiasis. No evidence of ureteral calculus. 2. Heterogeneous low density in the lower pole of the right kidney and adjacent perinephric soft tissue stranding, likely focal pyelonephritis. No drainable abscess identified at this time. 3. Mild sigmoid colon diverticulosis and  wall thickening without surrounding inflammation. 4. 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 Electronically Signed   By: Richardean Sale M.D.   On: 01/07/2018 09:54      Subjective: Patient seen and examined at bedside.  She feels better.  Still has some intermittent abdominal pain but her vomiting has improved.  She is tolerating diet.  Wants to go home.  No fevers.  Discharge Exam: Vitals:   01/09/18 2149 01/10/18 0409  BP: (!) 140/102 (!) 140/99  Pulse: 90 (!) 105  Resp: 12 12  Temp: 98.5 F (36.9 C) 99.7 F (37.6 C)  SpO2: 100% 100%   Vitals:   01/09/18 1152  01/09/18 1338 01/09/18 2149 01/10/18 0409  BP: (!) 145/104 (!) 135/100 (!) 140/102 (!) 140/99  Pulse: 90 99 90 (!) 105  Resp: 18 15 12 12   Temp: 98.6 F (37 C) 99.5 F (37.5 C) 98.5 F (36.9 C) 99.7 F (37.6 C)  TempSrc: Oral Oral Oral Oral  SpO2: 100% 100% 100% 100%  Weight:      Height:        General: Pt is alert, awake, not in acute distress Cardiovascular: rate controlled, S1/S2 + Respiratory: bilateral decreased breath sounds at bases Abdominal: Soft, slightly tender in the lower quadrant, ND, bowel sounds + Extremities: no edema, no cyanosis    The results of significant diagnostics from this hospitalization (including imaging, microbiology, ancillary and laboratory) are listed below for reference.     Microbiology: Recent Results (from the past 240 hour(s))  Urine Culture     Status: Abnormal   Collection Time: 01/07/18  7:30 AM  Result Value Ref Range Status   Specimen Description   Final    URINE, CLEAN CATCH Performed at [facility], [place] [address], [place], [place] [address]    Special Requests   Final    Normal Performed at [facility], [place] [address], [place], [place] [address]    Culture >=100,000 COLONIES/mL PROTEUS MIRABILIS (A)  Final   Report Status 01/09/2018 FINAL  Final   Organism ID, Bacteria PROTEUS MIRABILIS (A)  Final      Susceptibility   Proteus mirabilis - MIC*    AMPICILLIN RESISTANT Resistant     CEFAZOLIN RESISTANT Resistant     CEFTRIAXONE RESISTANT Resistant     CIPROFLOXACIN <=0.25 SENSITIVE Sensitive     GENTAMICIN <=1 SENSITIVE Sensitive     IMIPENEM 4 SENSITIVE Sensitive     NITROFURANTOIN 128 RESISTANT Resistant     TRIMETH/SULFA <=20 SENSITIVE Sensitive     AMPICILLIN/SULBACTAM <=2 SENSITIVE Sensitive     PIP/TAZO <=4 SENSITIVE Sensitive     * >=100,000 COLONIES/mL PROTEUS MIRABILIS  MRSA PCR Screening     Status: None   Collection Time: 01/07/18  1:40 PM  Result Value Ref Range  Status   MRSA by PCR NEGATIVE NEGATIVE Final    Comment:        The GeneXpert MRSA Assay (FDA approved for NASAL specimens only), is one component of a comprehensive MRSA colonization surveillance program. It is not intended to diagnose MRSA infection nor to guide or monitor treatment for MRSA infections. Performed at [facility], [place] [address], [place], [place] [address]      Labs: BNP (last 3 results) No results for input(s): BNP in the last 8760 hours. Basic Metabolic Panel: Recent Labs  Lab 01/07/18 0724 01/08/18 0258 01/10/18 0609  NA 136 139 137  K 2.9* 3.2* 2.6*  CL 97* 102 98  CO2 25 25 29   GLUCOSE 146* 105* 97  BUN 11 9 8   CREATININE 0.72 0.62 0.49  CALCIUM 9.5 8.7* 8.5*  MG 2.0  --  2.2  PHOS 3.3  --   --    Liver Function Tests: Recent Labs  Lab 01/07/18 0724 01/08/18 0258  AST 17 16  ALT 14 13  ALKPHOS 81 67  BILITOT 0.9 0.4  PROT 8.1 7.2  ALBUMIN 4.2 3.2*   Recent Labs  Lab 01/07/18 0724  LIPASE 34   No results for input(s): AMMONIA in the last 168 hours. CBC: Recent Labs  Lab 01/07/18 0724 01/08/18 0258 01/09/18 0311 01/10/18 0609  WBC 22.2* 16.1* 11.0* 7.6  NEUTROABS 19.1* 13.3* 7.3 4.6  HGB 13.6 12.6 12.0 11.7*  HCT 41.2 39.5 37.5 35.6*  MCV 93.0 95.2 94.5 94.4  PLT 278 218 226 258   Cardiac Enzymes: No results for input(s): CKTOTAL, CKMB, CKMBINDEX, TROPONINI in the last 168 hours. BNP: Invalid input(s): POCBNP CBG: No results for input(s): GLUCAP in the last 168 hours. D-Dimer No results for input(s): DDIMER in the last 72 hours. Hgb A1c No results for input(s): HGBA1C in the last 72 hours. Lipid Profile No results for input(s): CHOL, HDL, LDLCALC, TRIG, CHOLHDL, LDLDIRECT in the last 72 hours. Thyroid function studies No results for input(s): TSH, T4TOTAL, T3FREE, THYROIDAB in the last 72 hours.  Invalid input(s): FREET3 Anemia work up No results for input(s): VITAMINB12, FOLATE, FERRITIN,  TIBC, IRON, RETICCTPCT in the last 72 hours. Urinalysis    Component Value Date/Time   COLORURINE YELLOW 01/07/2018 0730   APPEARANCEUR CLOUDY (A) 01/07/2018 0730   LABSPEC 1.020 01/07/2018 0730   PHURINE 6.0 01/07/2018 0730   GLUCOSEU NEGATIVE 01/07/2018 0730   HGBUR LARGE (A) 01/07/2018 0730   BILIRUBINUR NEGATIVE 01/07/2018 0730   KETONESUR 5 (A) 01/07/2018 0730   PROTEINUR >=300 (A) 01/07/2018 0730   NITRITE NEGATIVE 01/07/2018 0730   LEUKOCYTESUR LARGE (A) 01/07/2018 0730   Sepsis Labs Invalid input(s): PROCALCITONIN,  WBC,  LACTICIDVEN Microbiology Recent Results (from the past 240 hour(s))  Urine Culture     Status: Abnormal   Collection Time: 01/07/18  7:30 AM  Result Value Ref Range Status   Specimen Description   Final    URINE, CLEAN CATCH Performed at [facility], [place] [address] [place], [place] [address]    Special Requests   Final    Normal Performed at [facility], [place] [address], [place], [place] [address]    Culture >=100,000 COLONIES/mL PROTEUS MIRABILIS (A)  Final   Report Status 01/09/2018 FINAL  Final   Organism ID, Bacteria PROTEUS MIRABILIS (A)  Final      Susceptibility   Proteus mirabilis - MIC*    AMPICILLIN RESISTANT Resistant     CEFAZOLIN RESISTANT Resistant     CEFTRIAXONE RESISTANT Resistant     CIPROFLOXACIN <=0.25 SENSITIVE Sensitive     GENTAMICIN <=1 SENSITIVE Sensitive     IMIPENEM 4 SENSITIVE Sensitive     NITROFURANTOIN 128 RESISTANT Resistant     TRIMETH/SULFA <=20 SENSITIVE Sensitive     AMPICILLIN/SULBACTAM <=2 SENSITIVE Sensitive     PIP/TAZO <=4 SENSITIVE Sensitive     * >=100,000 COLONIES/mL PROTEUS MIRABILIS  MRSA PCR Screening     Status: None   Collection Time: 01/07/18  1:40 PM  Result Value Ref Range Status   MRSA by PCR NEGATIVE NEGATIVE Final    Comment:  The GeneXpert MRSA Assay (FDA approved for NASAL specimens only), is one component of a comprehensive MRSA  colonization surveillance program. It is not intended to diagnose MRSA infection nor to guide or monitor treatment for MRSA infections. Performed at [facility], [place] [address], [place], [place] [address]      Time coordinating discharge: 35 minutes  SIGNED:   Aline August, MD  [facility] 01/10/2018, 10:46 AM Pager: [phone]  If 7PM-7AM, please contact night-coverage [email or web address] Password [email or web address]

## 2018-01-22 ENCOUNTER — Other Ambulatory Visit: Payer: Self-pay | Admitting: Urology

## 2018-02-04 ENCOUNTER — Other Ambulatory Visit: Payer: Self-pay

## 2018-02-04 ENCOUNTER — Encounter (HOSPITAL_BASED_OUTPATIENT_CLINIC_OR_DEPARTMENT_OTHER): Payer: Self-pay | Admitting: *Deleted

## 2018-02-04 NOTE — Progress Notes (Signed)
Spoke with patient via telephone for pre op interview. Patient to take Norvasc AM of surgery. Patient may have clear liquids until 0630 AM. Patient verbalized understanding of clear liquids and NO milk products. Arrival time 1030. Will need UPT AM of surgery.

## 2018-02-09 NOTE — Patient Instructions (Signed)
April [name]  03-21-1975     Your procedure is scheduled on:  02-15-2018   Report to [facility] Main  Entrance,  Report to admitting at  12:45 PM    Call this number if you have problems the morning of surgery [phone]       Remember: Do not eat food After Midnight.   May have clear liquids until midnight until 8:45 AM day of surgery.   Nothing by mouth after 8:45 AM day of surgery including water,candy,gum, mints.   BRUSH YOUR TEETH MORNING OF SURGERY AND RINSE YOUR MOUTH OUT        Take these medicines the morning of surgery with A SIP OF WATER:  Amlodipine,  Tramadol if needed,  Zofran if needed                                   You may not have any metal on your body including hair pins and                piercings  Do not wear jewelry, make-up, lotions, powders or perfumes, deodorant              Do not wear nail polish.  Do not shave  48 hours prior to surgery.                 Do not bring valuables to the hospital. [place].  Contacts, dentures or bridgework may not be worn into surgery.  Leave suitcase in the car. After surgery it may be brought to your room.     Patients discharged the day of surgery will not be allowed to drive home. IF YOU ARE HAVING SURGERY AND GOING HOME THE SAME DAY, YOU MUST HAVE AN ADULT TO DRIVE YOU HOME AND BE WITH YOU FOR 24 HOURS. YOU MAY GO HOME BY TAXI OR UBER OR ORTHERWISE, BUT AN ADULT MUST ACCOMPANY YOU HOME AND STAY WITH YOU FOR 24 HOURS.    Name and phone number of your driver:     _____________________________________________________________________    CLEAR LIQUID DIET   Foods Allowed                                                                     Foods Excluded  Coffee and tea, regular and decaf                             liquids that you cannot  Plain Jell-O in any flavor                                              see through such as: Fruit ices (not with fruit pulp)  milk, soups, orange juice  Iced Popsicles                                    All solid food Carbonated beverages, regular and diet                                    Cranberry, grape and apple juices Sports drinks like Gatorade Lightly seasoned clear broth or consume(fat free) Sugar, honey syrup  Sample Menu Breakfast                                Lunch                                     Supper Cranberry juice                    Beef broth                            Chicken broth Jell-O                                     Grape juice                           Apple juice Coffee or tea                        Jell-O                                      Popsicle                                                Coffee or tea                        Coffee or tea  _____________________________________________________________________            [facility] - Preparing for Surgery Before surgery, you can play an important role.  Because skin is not sterile, your skin needs to be as free of germs as possible.  You can reduce the number of germs on your skin by washing with CHG (chlorahexidine gluconate) soap before surgery.  CHG is an antiseptic cleaner which kills germs and bonds with the skin to continue killing germs even after washing. Please DO NOT use if you have an allergy to CHG or antibacterial soaps.  If your skin becomes reddened/irritated stop using the CHG and inform your nurse when you arrive at Short Stay. Do not shave (including legs and underarms) for at least 48 hours prior to the first CHG shower.  You may shave your face/neck. Please follow these instructions carefully:  1.  Shower with CHG Soap the night before surgery and the  morning  of Surgery.  2.  If you choose to wash your hair, wash your hair first as usual with your  normal  shampoo.  3.  After you shampoo, rinse your hair and body  thoroughly to remove the  shampoo.                            4.  Use CHG as you would any other liquid soap.  You can apply chg directly  to the skin and wash                       Gently with a scrungie or clean washcloth.  5.  Apply the CHG Soap to your body ONLY FROM THE NECK DOWN.   Do not use on face/ open                           Wound or open sores. Avoid contact with eyes, ears mouth and genitals (private parts).                       Wash face,  Genitals (private parts) with your normal soap.             6.  Wash thoroughly, paying special attention to the area where your surgery  will be performed.  7.  Thoroughly rinse your body with warm water from the neck down.  8.  DO NOT shower/wash with your normal soap after using and rinsing off  the CHG Soap.             9.  Pat yourself dry with a clean towel.            10.  Wear clean pajamas.            11.  Place clean sheets on your bed the night of your first shower and do not  sleep with pets. Day of Surgery : Do not apply any lotions/deodorants the morning of surgery.  Please wear clean clothes to the hospital/surgery center.  FAILURE TO FOLLOW THESE INSTRUCTIONS MAY RESULT IN THE CANCELLATION OF YOUR SURGERY PATIENT SIGNATURE_________________________________  NURSE SIGNATURE__________________________________  ________________________________________________________________________

## 2018-02-12 ENCOUNTER — Encounter (HOSPITAL_COMMUNITY): Payer: Self-pay

## 2018-02-12 ENCOUNTER — Encounter (HOSPITAL_COMMUNITY): Payer: Self-pay | Admitting: Anesthesiology

## 2018-02-12 ENCOUNTER — Encounter (HOSPITAL_COMMUNITY): Payer: Self-pay | Admitting: Physician Assistant

## 2018-02-12 ENCOUNTER — Inpatient Hospital Stay (HOSPITAL_COMMUNITY)
Admission: RE | Admit: 2018-02-12 | Discharge: 2018-02-12 | Disposition: A | Discharge status: Home / Self Care | Payer: Medicaid Other | Source: Ambulatory Visit

## 2018-02-12 HISTORY — DX: Calculus of kidney: N20.0

## 2018-02-12 NOTE — Progress Notes (Signed)
Pt was no show today for PAT apppointment.  Have been unable to reach pt by phone.  LVM for pt to arrive at [facility] @ 12:45 PM.  To be npo after mn with exception clear liquids diet (water, tea, black coffee with no cream/milk products, carbonated beverages, clear broth).  After 845: AM nothing by mouth including candy, gum, mints, only sip of water with amlodipine and if needed may take tramadol/ zofran.  Advised pt she is to have arranged responsible driver and someone be with her the next 24 hours after surgery due to anesthesia and pain medications.  Short Stay phone number given.

## 2018-02-12 NOTE — Progress Notes (Signed)
EKG dated 01-07-2018 in epic.  ECHO dated 01-07-2018 in epic.

## 2018-02-15 ENCOUNTER — Ambulatory Visit (HOSPITAL_COMMUNITY): Admission: RE | Admit: 2018-02-15 | Payer: Medicaid Other | Source: Home / Self Care | Admitting: Urology

## 2018-02-15 ENCOUNTER — Telehealth (HOSPITAL_COMMUNITY): Payer: Self-pay | Admitting: *Deleted

## 2018-02-15 SURGERY — CYSTOURETEROSCOPY, WITH RETROGRADE PYELOGRAM AND STENT INSERTION
Anesthesia: General | Laterality: Bilateral

## 2018-03-12 ENCOUNTER — Encounter (HOSPITAL_COMMUNITY)
Admission: RE | Admit: 2018-03-12 | Discharge: 2018-03-12 | Disposition: A | Discharge status: Home / Self Care | Payer: Medicaid Other | Source: Ambulatory Visit | Attending: Urology | Admitting: Urology

## 2018-03-15 ENCOUNTER — Other Ambulatory Visit: Payer: Self-pay

## 2018-03-15 ENCOUNTER — Emergency Department (HOSPITAL_COMMUNITY): Payer: Medicaid Other

## 2018-03-15 ENCOUNTER — Encounter (HOSPITAL_COMMUNITY): Payer: Self-pay | Admitting: Emergency Medicine

## 2018-03-15 ENCOUNTER — Encounter (HOSPITAL_COMMUNITY): Payer: Self-pay

## 2018-03-15 ENCOUNTER — Encounter (HOSPITAL_COMMUNITY)
Admission: RE | Admit: 2018-03-15 | Discharge: 2018-03-15 | Disposition: A | Discharge status: Home / Self Care | Payer: Medicaid Other | Source: Ambulatory Visit | Attending: Urology | Admitting: Urology

## 2018-03-15 ENCOUNTER — Emergency Department (HOSPITAL_COMMUNITY)
Admission: EM | Admit: 2018-03-15 | Discharge: 2018-03-15 | Disposition: A | Discharge status: Home / Self Care | Payer: Medicaid Other | Attending: Emergency Medicine | Admitting: Emergency Medicine

## 2018-03-15 DIAGNOSIS — F17210 Nicotine dependence, cigarettes, uncomplicated: Secondary | ICD-10-CM | POA: Insufficient documentation

## 2018-03-15 DIAGNOSIS — Z79899 Other long term (current) drug therapy: Secondary | ICD-10-CM | POA: Diagnosis not present

## 2018-03-15 DIAGNOSIS — N23 Unspecified renal colic: Secondary | ICD-10-CM

## 2018-03-15 DIAGNOSIS — N200 Calculus of kidney: Secondary | ICD-10-CM | POA: Insufficient documentation

## 2018-03-15 DIAGNOSIS — I10 Essential (primary) hypertension: Secondary | ICD-10-CM | POA: Insufficient documentation

## 2018-03-15 DIAGNOSIS — R1084 Generalized abdominal pain: Secondary | ICD-10-CM | POA: Diagnosis present

## 2018-03-15 LAB — COMPREHENSIVE METABOLIC PANEL
ALT: 25 U/L (ref 0–44)
AST: 22 U/L (ref 15–41)
Albumin: 4.9 g/dL (ref 3.5–5.0)
Alkaline Phosphatase: 101 U/L (ref 38–126)
Anion gap: 9 (ref 5–15)
BUN: 12 mg/dL (ref 6–20)
CO2: 26 mmol/L (ref 22–32)
Calcium: 9.7 mg/dL (ref 8.9–10.3)
Chloride: 102 mmol/L (ref 98–111)
Creatinine, Ser: 0.6 mg/dL (ref 0.44–1.00)
GFR calc Af Amer: >60 mL/min (ref >60)
GFR calc non Af Amer: >60 mL/min (ref >60)
Glucose, Bld: 117 mg/dL — ABNORMAL HIGH (ref 70–99)
POTASSIUM: 4 mmol/L (ref 3.5–5.1)
Sodium: 137 mmol/L (ref 135–145)
Total Bilirubin: 0.4 mg/dL (ref 0.3–1.2)
Total Protein: 9.2 g/dL — ABNORMAL HIGH (ref 6.5–8.1)

## 2018-03-15 LAB — CBC
HCT: 42.6 % (ref 36.0–46.0)
HEMOGLOBIN: 13.5 g/dL (ref 12.0–15.0)
MCH: 30.3 pg (ref 26.0–34.0)
MCHC: 31.7 g/dL (ref 30.0–36.0)
MCV: 95.5 fL (ref 80.0–100.0)
Platelets: 436 10*3/uL — ABNORMAL HIGH (ref 150–400)
RBC: 4.46 MIL/uL (ref 3.87–5.11)
RDW: 15 % (ref 11.5–15.5)
WBC: 9.6 10*3/uL (ref 4.0–10.5)
nRBC: 0 % (ref 0.0–0.2)

## 2018-03-15 LAB — I-STAT BETA HCG BLOOD, ED (MC, WL, AP ONLY)

## 2018-03-15 LAB — URINALYSIS, ROUTINE W REFLEX MICROSCOPIC
Bilirubin Urine: NEGATIVE
Glucose, UA: NEGATIVE mg/dL
Ketones, ur: 20 mg/dL — AB
Leukocytes,Ua: NEGATIVE
Nitrite: NEGATIVE
PROTEIN: NEGATIVE mg/dL
Specific Gravity, Urine: 1.011 (ref 1.005–1.030)
pH: 5 (ref 5.0–8.0)

## 2018-03-15 LAB — LIPASE, BLOOD: Lipase: 39 U/L (ref 11–51)

## 2018-03-15 MED ORDER — ONDANSETRON 4 MG PO TBDP
4.0000 mg | ORAL_TABLET | Freq: Once | ORAL | Status: AC | PRN
  Administered 2018-03-15: 4 mg via ORAL
  Filled 2018-03-15: qty 1

## 2018-03-15 MED ORDER — HYDROMORPHONE HCL 1 MG/ML IJ SOLN
1.0000 mg | Freq: Once | INTRAMUSCULAR | Status: AC
  Administered 2018-03-15: 1 mg via INTRAVENOUS
  Filled 2018-03-15: qty 1

## 2018-03-15 MED ORDER — SODIUM CHLORIDE 0.9 % IV BOLUS
1000.0000 mL | Freq: Once | INTRAVENOUS | Status: AC
  Administered 2018-03-15: 1000 mL via INTRAVENOUS

## 2018-03-15 MED ORDER — KETOROLAC TROMETHAMINE 30 MG/ML IJ SOLN
30.0000 mg | Freq: Once | INTRAMUSCULAR | Status: AC
  Administered 2018-03-15: 30 mg via INTRAVENOUS
  Filled 2018-03-15: qty 1

## 2018-03-15 MED ORDER — OXYCODONE-ACETAMINOPHEN 5-325 MG PO TABS
2.0000 | ORAL_TABLET | Freq: Once | ORAL | Status: AC
  Administered 2018-03-15: 2 via ORAL
  Filled 2018-03-15: qty 2

## 2018-03-15 MED ORDER — OXYCODONE-ACETAMINOPHEN 5-325 MG PO TABS: 1.0000 | ORAL_TABLET | Freq: Four times a day (QID) | ORAL | 0 refills | Status: DC | PRN

## 2018-03-15 MED ORDER — KETOROLAC TROMETHAMINE 10 MG PO TABS: 10.0000 mg | ORAL_TABLET | Freq: Four times a day (QID) | ORAL | 0 refills | Status: DC | PRN

## 2018-03-15 NOTE — ED Triage Notes (Signed)
Pt was on her way to pre op for surgery for kidney stones on Wed but was having severe abd and flank pains with n/v that couldn't make it.

## 2018-03-15 NOTE — ED Notes (Signed)
Pt encouraged to provide urine.  

## 2018-03-15 NOTE — Pre-Procedure Instructions (Signed)
Sent message to April [name] that she was a no show for her PAT again today.

## 2018-03-15 NOTE — Discharge Instructions (Signed)
Take toradol instead of motrin for pain   Take percocet for severe pain   Follow up with Dr. Alyson Ingles on Wednesday for your scheduled surgery. Don't eat or drink anything on Tuesday night   Return to ER if you have worse abdominal pain, flank pain, fever.

## 2018-03-15 NOTE — ED Provider Notes (Signed)
[facility] DEPT Provider Note   CSN: [id number] Arrival date & time: 03/15/18  1043    History   Chief Complaint Chief Complaint  Patient presents with  . Abdominal Pain  . Emesis    HPI April [name] is a 43 y.o. female history of bilateral renal calculi, recurrent pyelonephritis here presenting with persistent abdominal pain.  Patient was admitted to the hospital multiple times for recurrent pyelonephritis as well as multiple kidney stones for pain control and IV abx.  She was admitted recently for pyelonephritis about a month ago at [facility] in [place] and is still taking ciprofloxacin.  Patient states that her pain never went away and she has bilateral flank pain associated with some vomiting.  Patient denies any dysuria or obvious hematuria.  Patient is scheduled for cystoscopy with stents in 2 days and was on her way to preop testing and had worsening pain so came here for evaluation. Patient states that she has seen Dr. Alyson Ingles from urology.      The history is provided by the patient.    Past Medical History:  Diagnosis Date  . Hypertension   . Renal calculi    bilateral    Patient Active Problem List   Diagnosis Date Noted  . Sepsis secondary to UTI ([place]) 01/07/2018  . Hypertension 01/07/2018  . Sinus tachycardia 01/07/2018  . Hypokalemia 01/07/2018  . Abnormal EKG 01/07/2018  . Hyperglycemia 01/07/2018  . Tobacco use 01/07/2018    Past Surgical History:  Procedure Laterality Date  . CESAREAN SECTION     x2  . URETERAL STENT PLACEMENT       OB History   No obstetric history on file.      Home Medications    Prior to Admission medications   Medication Sig Start Date End Date Taking? Authorizing Provider  Acetaminophen 500 MG coapsule Take 1,000 mg by mouth every 6 (six) hours as needed for moderate pain or headache.     [provider]  amLODipine (NORVASC) 10 MG tablet Take 10 mg by mouth daily.     [provider]  ciprofloxacin (CIPRO) 500 MG tablet Take 500 mg by mouth 2 (two) times daily.    [provider]  etonogestrel (NEXPLANON) 68 MG IMPL implant 68 mg by Subdermal route once.     [provider]  fluconazole (DIFLUCAN) 150 MG tablet Take 1 tablet (150 mg total) by mouth once as needed for up to 1 dose (for yeast infection). Patient not taking: Reported on 03/09/2018 01/10/18   April August, MD  lisinopril (PRINIVIL,ZESTRIL) 20 MG tablet Take 20 mg by mouth daily.    [provider]  metoprolol tartrate (LOPRESSOR) 100 MG tablet Take 100 mg by mouth 2 (two) times daily.    [provider]  ondansetron (ZOFRAN) 4 MG tablet Take 1 tablet (4 mg total) by mouth every 6 (six) hours as needed for nausea or vomiting. Patient not taking: Reported on 03/09/2018 01/10/18 01/10/19  April August, MD  traMADol (ULTRAM) 50 MG tablet Take 1 tablet (50 mg total) by mouth every 6 (six) hours as needed for moderate pain. Patient not taking: Reported on 03/09/2018 01/10/18 01/10/19  April August, MD    Family History Family History  Problem Relation Age of Onset  . Hypertension Mother   . Diabetes Mellitus II Father   . Stroke Father   . Ulcers Sister     Social History Social History   Tobacco Use  .  Smoking status: Current Every Day Smoker    Packs/day: 0.50  . Smokeless tobacco: Never Used  Substance Use Topics  . Alcohol use: Yes  . Drug use: Not Currently     Allergies   Patient has no known allergies.   Review of Systems Review of Systems  Gastrointestinal: Positive for abdominal pain and vomiting.  All other systems reviewed and are negative.    Physical Exam Updated Vital Signs BP (!) 186/108   Pulse 84   Temp 97.9 F (36.6 C) (Oral)   Resp 15   SpO2 100%   Physical Exam Vitals signs and nursing note reviewed.  Constitutional:      Comments: Uncomfortable   HENT:     Head: Normocephalic.     Mouth/Throat:       Pharynx: Oropharynx is clear.  Eyes:     Extraocular Movements: Extraocular movements intact.  Cardiovascular:     Rate and Rhythm: Normal rate and regular rhythm.  Pulmonary:     Effort: Pulmonary effort is normal.     Breath sounds: Normal breath sounds.  Abdominal:     General: Abdomen is flat. Bowel sounds are normal.     Comments: Mild bilateral CVAT   Skin:    General: Skin is warm.     Capillary Refill: Capillary refill takes less than 2 seconds.  Neurological:     General: No focal deficit present.     Mental Status: She is oriented to person, place, and time.  Psychiatric:        Mood and Affect: Mood normal.        Behavior: Behavior normal.      ED Treatments / Results  Labs (all labs ordered are listed, but only abnormal results are displayed) Labs Reviewed  COMPREHENSIVE METABOLIC PANEL - Abnormal; Notable for the following components:      Result Value   Glucose, Bld 117 (*)    Total Protein 9.2 (*)    All other components within normal limits  CBC - Abnormal; Notable for the following components:   Platelets 436 (*)    All other components within normal limits  LIPASE, BLOOD  URINALYSIS, ROUTINE W REFLEX MICROSCOPIC  I-STAT BETA HCG BLOOD, ED ([facility], [facility], [facility] ONLY)    EKG None  Radiology No results found.  Procedures Procedures (including critical care time)  Medications Ordered in ED Medications  sodium chloride 0.9 % bolus 1,000 mL (has no administration in time range)  ketorolac (TORADOL) 30 MG/ML injection 30 mg (has no administration in time range)  HYDROmorphone (DILAUDID) injection 1 mg (has no administration in time range)  ondansetron (ZOFRAN-ODT) disintegrating tablet 4 mg (4 mg Oral Given 03/15/18 1054)     Initial Impression / Assessment and Plan / ED Course  I have reviewed the triage vital signs and the nursing notes.  Pertinent labs & imaging results that were available during my care of the patient were reviewed by me and  considered in my medical decision making (see chart for details).       [name] is a 43 y.o. female here with flank pain. Has bilateral renal stones with recurrent pyelonephritis. Will get labs, UA. Will give pain meds and repeat renal US. Will consult urology.   5:21 PM Cr stable. UA showed no obvious UTI and she is already on cipro. US showed bilateral stones with no hydro. Pain improved after pain meds. I talked to Dr. Gloriann [name] from urology. He reviewed patient's records. He recommend  pain control with pain meds, toradol, add on urine culture. Since patient has stent placement scheduled in 2 days, he felt that patient doesn't need emergent surgery. Stable for discharge.    Final Clinical Impressions(s) / ED Diagnoses   Final diagnoses:  None    ED Discharge Orders    None       Drenda Freeze, MD 03/15/18 1723

## 2018-03-15 NOTE — ED Notes (Signed)
Bed: JK82 Expected date:  Expected time:  Means of arrival:  Comments: HOLD

## 2018-03-16 ENCOUNTER — Other Ambulatory Visit: Payer: Self-pay | Admitting: Urology

## 2018-03-16 ENCOUNTER — Encounter (HOSPITAL_COMMUNITY): Payer: Self-pay | Admitting: Anesthesiology

## 2018-03-16 NOTE — Patient Instructions (Signed)
Attempted to call patient about time of arrival for surgery tomorrow, 03/17/2018. No answer. I spoke with son yesterday and he stated she was in the ED at [facility] with abdominal pain again. He told me he would call me today and let me know if she was going to proceed with surgery and according to ED notes, she was discharged and was planning on having her surgery. I left message that patient is to be here at 1100 in the morning and to be NPO after midnight except for her amlodipine and her metoprolol with a sip of water and to have someone to drive her home. I left our number for her to call for any questions.

## 2018-03-16 NOTE — Pre-Procedure Instructions (Signed)
Attempted to call patient about time of arrival for surgery tomorrow, 03/17/2018. No answer. I spoke with son yesterday and he stated she was in the ED at [facility] with abdominal pain again. He told me he would call me today and let me know if she was going to proceed with surgery and according to ED notes, she was discharged and was planning on having her surgery. I left message that patient is to be here at 1100 in the morning and to be NPO after midnight except for her amlodipine and her metoprolol with a sip of water and to have someone to drive her home. I left our number for her to call for any questions.

## 2018-03-16 NOTE — OR Nursing (Signed)
Called  office in regards to this patient having a surgery procedure at 1230,  Informed that we still need the consent order to proceed with surgery.

## 2018-03-17 ENCOUNTER — Ambulatory Visit (HOSPITAL_COMMUNITY): Admission: RE | Admit: 2018-03-17 | Payer: Medicaid Other | Source: Home / Self Care | Admitting: Urology

## 2018-03-17 ENCOUNTER — Encounter (HOSPITAL_COMMUNITY): Admission: RE | Payer: Self-pay | Source: Home / Self Care

## 2018-03-17 LAB — URINE CULTURE: Culture: NO GROWTH

## 2018-03-17 SURGERY — CYSTOURETEROSCOPY, WITH RETROGRADE PYELOGRAM AND STENT INSERTION
Anesthesia: General | Laterality: Bilateral

## 2018-03-17 MED ORDER — DIATRIZOATE MEGLUMINE 30 % UR SOLN
URETHRAL | Status: AC
  Filled 2018-03-17: qty 100

## 2018-03-17 MED ORDER — PROPOFOL 10 MG/ML IV BOLUS
INTRAVENOUS | Status: AC
  Filled 2018-03-17: qty 40

## 2018-03-17 NOTE — Progress Notes (Signed)
Pt called at 1115 and stated that she would be here in 5 minutes. Still no show at 1158. Dr Alyson Ingles notified. He stated if pt did not show by 1215 then the procedure was canceled.

## 2019-03-14 DIAGNOSIS — K5900 Constipation, unspecified: Secondary | ICD-10-CM | POA: Insufficient documentation

## 2019-03-14 DIAGNOSIS — K5909 Other constipation: Secondary | ICD-10-CM | POA: Insufficient documentation

## 2019-04-08 DIAGNOSIS — C19 Malignant neoplasm of rectosigmoid junction: Secondary | ICD-10-CM | POA: Insufficient documentation

## 2019-04-08 DIAGNOSIS — C20 Malignant neoplasm of rectum: Secondary | ICD-10-CM | POA: Insufficient documentation

## 2019-04-09 DIAGNOSIS — N9489 Other specified conditions associated with female genital organs and menstrual cycle: Secondary | ICD-10-CM | POA: Insufficient documentation

## 2019-06-07 DIAGNOSIS — Z79899 Other long term (current) drug therapy: Secondary | ICD-10-CM | POA: Insufficient documentation

## 2019-06-07 DIAGNOSIS — M25659 Stiffness of unspecified hip, not elsewhere classified: Secondary | ICD-10-CM | POA: Insufficient documentation

## 2019-07-14 ENCOUNTER — Other Ambulatory Visit: Payer: Self-pay

## 2019-07-14 ENCOUNTER — Encounter: Payer: Medicaid Other | Admitting: Student

## 2019-07-14 ENCOUNTER — Ambulatory Visit
Admission: RE | Admit: 2019-07-14 | Discharge: 2019-07-14 | Disposition: A | Discharge status: Home / Self Care | Payer: Self-pay | Source: Ambulatory Visit | Attending: Oncology | Admitting: Oncology

## 2019-07-14 DIAGNOSIS — C20 Malignant neoplasm of rectum: Secondary | ICD-10-CM

## 2019-07-14 NOTE — Progress Notes (Signed)
Spoke with patient and introduced myself, explained my role as nurse navigator and that we had received a referral from Dr. Fanny Skates at [facility] stating that she wished to receive her treatments here at [facility].  She agreed with this.  I informed her that I have her scheduled to see Dr. Benay Spice at 8:00 on 07/21/2019 and that she would need to arrive by 7:45 for registration.  She was given our location and I explained that [place] parking is available.  She was also given my direct phone number should she have any questions prior to her visit.  She verbalized an understanding and had no further questions at this time.

## 2019-07-14 NOTE — Assessment & Plan Note (Deleted)
Pt w/ hx of essential hypertension on lisinopril, amlodipine and metoprolol. Recent elevation of BP to systolic in 838F-840R and diastolic in upper 75O during chemotherapy infusion sessions. Advised by PCP to increase amlodipine from 5 mg to 10 mg on 6/23.

## 2019-07-14 NOTE — Progress Notes (Signed)
out

## 2019-07-14 NOTE — Progress Notes (Signed)
Spoke with [facility] in [place] and faxed over a request to have her images from CT scans in March and May pushed to [employer].

## 2019-07-20 ENCOUNTER — Encounter: Payer: Self-pay | Admitting: *Deleted

## 2019-07-21 ENCOUNTER — Telehealth: Payer: Self-pay | Admitting: Oncology

## 2019-07-21 ENCOUNTER — Other Ambulatory Visit: Payer: Self-pay

## 2019-07-21 ENCOUNTER — Telehealth: Payer: Self-pay | Admitting: *Deleted

## 2019-07-21 ENCOUNTER — Telehealth: Payer: Self-pay | Admitting: General Practice

## 2019-07-21 ENCOUNTER — Inpatient Hospital Stay: Payer: Medicaid Other | Attending: Oncology | Admitting: Oncology

## 2019-07-21 VITALS — BP 150/122 | HR 77 | Temp 97.5°F | Resp 18 | Ht 67.5 in | Wt 129.9 lb

## 2019-07-21 DIAGNOSIS — Z5111 Encounter for antineoplastic chemotherapy: Secondary | ICD-10-CM | POA: Insufficient documentation

## 2019-07-21 DIAGNOSIS — Z7189 Other specified counseling: Secondary | ICD-10-CM | POA: Insufficient documentation

## 2019-07-21 DIAGNOSIS — I10 Essential (primary) hypertension: Secondary | ICD-10-CM | POA: Diagnosis not present

## 2019-07-21 DIAGNOSIS — C187 Malignant neoplasm of sigmoid colon: Secondary | ICD-10-CM | POA: Insufficient documentation

## 2019-07-21 DIAGNOSIS — Z9079 Acquired absence of other genital organ(s): Secondary | ICD-10-CM

## 2019-07-21 DIAGNOSIS — Z90722 Acquired absence of ovaries, bilateral: Secondary | ICD-10-CM

## 2019-07-21 DIAGNOSIS — C786 Secondary malignant neoplasm of retroperitoneum and peritoneum: Secondary | ICD-10-CM

## 2019-07-21 DIAGNOSIS — C20 Malignant neoplasm of rectum: Secondary | ICD-10-CM

## 2019-07-21 DIAGNOSIS — N200 Calculus of kidney: Secondary | ICD-10-CM

## 2019-07-21 DIAGNOSIS — Z79899 Other long term (current) drug therapy: Secondary | ICD-10-CM

## 2019-07-21 DIAGNOSIS — C7962 Secondary malignant neoplasm of left ovary: Secondary | ICD-10-CM | POA: Diagnosis not present

## 2019-07-21 MED ORDER — HYDRALAZINE HCL 10 MG PO TABS: 10.0000 mg | ORAL_TABLET | Freq: Three times a day (TID) | ORAL | 1 refills | Status: DC

## 2019-07-21 NOTE — Telephone Encounter (Signed)
[facility] CSW Progress Notes  Request received to call patient and offer resources/support.  Called, no answer, left generic VM w my contact information as VM was not personally identified.   Edwyna Shell, LCSW Clinical Social Worker Phone:  [phone] Cell:  [phone]

## 2019-07-21 NOTE — Progress Notes (Signed)
START OFF PATHWAY REGIMEN - Colorectal   [id number]:FOLFIRI + Bevacizumab (Leucovorin IV D1 + Fluorouracil IV D1/CIV D1,2 + Irinotecan IV D1 + Bevacizumab IV D1) q14 Days:   A cycle is every 14 days:     Bevacizumab-xxxx      Irinotecan      Leucovorin      Fluorouracil      Fluorouracil   **Always confirm dose/schedule in your pharmacy ordering system**  Patient Characteristics: Distant Metastases, Nonsurgical Candidate, KRAS/NRAS Wild-Type (BRAF V600 Wild-Type/Unknown), Standard Cytotoxic Therapy, First Line Standard Cytotoxic Therapy, Left-sided Tumor, PS = 0,1 Tumor Location: Colon Therapeutic Status: Distant Metastases Microsatellite/Mismatch Repair Status: MSS/pMMR BRAF Mutation Status: Wild-Type (no mutation) KRAS/NRAS Mutation Status: Wild-Type (no mutation) Preferred Therapy Approach: Standard Cytotoxic Therapy Standard Cytotoxic Line of Therapy: First Line Standard Cytotoxic Therapy ECOG Performance Status: 0 Primary Tumor Location: Left-sided Tumor Intent of Therapy: Non-Curative / Palliative Intent, Discussed with Patient

## 2019-07-21 NOTE — Progress Notes (Signed)
April [name] New Patient Consult   Requesting MD: April [name]   April [name] 44 y.o.  10/12/1975    Reason for Consult: Colorectal cancer   HPI: April [name] reports developing constipation in December 2019.  She reports being diagnosed with kidney stones.  She was admitted in February 2020 with nausea/vomiting and fever.  A CT abdomen/pelvis revealed urinary tract stones bilaterally, but no obstructive change was seen in either kidney.  There was cortical thinning of the right kidney and possible loss of corticomedullary differentiation which could reflect pyelonephritis.  She presented to the emergency room with abdominal pain on 04/06/2018.  A noncontrast CT revealed no acute intra-abdominal process, bilateral nephrolithiasis with no evidence of obstruction.  Mild diverticulosis without diverticulitis.  In May 2020 she was again seen in the emergency room with abdominal pain and constipation.  A CT revealed mild wall thickening of the rectosigmoid colon and dilated parametrial/gonadal vessels.  She was referred for GI evaluation.  She presented to emergency room in July 2020 with abdominal pain and rectal bleeding.  A CT on 07/22/2018 revealed rectosigmoid colitis, bilateral nephrolithiasis and persistent dilation of the periuterine veins.  She was seen by gastroenterology in August 2020 and a colonoscopy was planned, but was postponed secondary to hypertension.  On 03/24/2019 she underwent a colonoscopy.  This revealed 3 carpet-like polyps in the rectum.  The polyps were removed.  A completely obstructing mass was found in the distal sigmoid colon.  The mass could not be traversed.  A biopsy revealed moderately differentiated adenocarcinoma with intact mismatch repair protein expression.  The rectal polyps returned as hyperplastic.  The CEA was elevated at 56.1 on 03/24/2019.  A repeat CT on 03/29/2019 revealed circumferential wall thickening of a long segment of  sigmoid colon to the level of the rectosigmoid junction.  Interval development of a complex mass at the left ovary.  She underwent a robotic assisted low anterior resection, mesenteric lymphadenectomy, and robotic bilateral salpingo-oophorectomy on 04/08/2019.  The operative findings included peritoneal implants on the right diaphragm and throughout the pelvis.  The pathology revealed metastatic adenocarcinoma of the peritoneum, serosa of the urinary bladder, serosa overlying the right ureterosacral area, pelvic peritoneum, left ovary, and right diaphragm.  The right ovary and omentum were negative for malignancy.  The rectosigmoid colon contained adenocarcinoma, grade 2, tumor arose in the distal sigmoid colon with extension to the proximal rectum.  Tumor involved the serosal surface.  Lymphovascular perineural invasion are present.  Tumor deposits are present.  Resection margins are negative.  7/22 lymph nodes were involved with metastatic carcinoma. A CT of the chest on 04/18/2019 was negative.  The CEA returned at 9.7 on 04/27/2019.  Restaging CTs on 05/19/2019 revealed no evidence of metastatic disease with findings suspicious for colitis of the transverse and ascending colon and a small amount of ascites in the cul-de-sac.  She began FOLFIRI chemotherapy 05/24/2019.  Bevacizumab was added on 06/07/2019.  She completed cycle 4 of FOLFIRI/bevacizumab on 07/05/2019.  She was evaluated by Dr. Fanny [name] on 07/11/2019.  The plan is to schedule restaging CTs and continue FOLFIRI/Avastin versus changing to FOLFOX/Panitumumab.  April [name] has relocated to [facility] and would like to continue oncology care closer to home.  She feels well at present.  She reports having nausea and vomiting following the first cycle of FOLFIRI, but not following subsequent cycles.   Past Medical History:  Diagnosis Date  . Hypertension   . Renal calculi    bilateral    .  G4, P3, 1 set of twins  Past Surgical History:    Procedure Laterality Date  . CESAREAN SECTION     x2  . URETERAL STENT PLACEMENT      .  Birth control implant  Medications: Reviewed  Allergies: No Known Allergies  Family history: No family history of cancer  Social History:   She lives with her twin daughters in [place].  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  She smokes cigarettes.  She does not use alcohol.  No transfusion history.  No risk factor for HIV or hepatitis.  ROS:   Positives include: Night sweats/hot flashes following the oophorectomy, occasional diarrhea, occasional mild discomfort in the right lower abdomen  A complete ROS was otherwise negative.  Physical Exam:  Blood pressure (!) 159/125, pulse 77, temperature (!) 97.5 F (36.4 C), temperature source Temporal, resp. rate 18, height 5' 7.5" (1.715 m), weight 129 lb 14.4 oz (58.9 kg), SpO2 100 %.  HEENT: No thrush or ulcers Lungs: Clear bilaterally Cardiac: Regular rate and rhythm Abdomen: No hepatosplenomegaly, no mass, nontender  Vascular: No leg edema Lymph nodes: No cervical, supraclavicular, axillary, or inguinal nodes Neurologic: Alert and oriented, the motor exam appears intact in the upper and lower extremities bilaterally Skin: Hyperpigmentation of the hands and nailbeds Musculoskeletal: No spine tenderness   LAB:  CBC  Lab Results  Component Value Date   WBC 9.6 03/15/2018   HGB 13.5 03/15/2018   HCT 42.6 03/15/2018   MCV 95.5 03/15/2018   PLT 436 (H) 03/15/2018   NEUTROABS 4.6 01/10/2018        CMP  Lab Results  Component Value Date   NA 137 03/15/2018   K 4.0 03/15/2018   CL 102 03/15/2018   CO2 26 03/15/2018   GLUCOSE 117 (H) 03/15/2018   BUN 12 03/15/2018   CREATININE 0.60 03/15/2018   CALCIUM 9.7 03/15/2018   PROT 9.2 (H) 03/15/2018   ALBUMIN 4.9 03/15/2018   AST 22 03/15/2018   ALT 25 03/15/2018   ALKPHOS 101 03/15/2018   BILITOT 0.4 03/15/2018   GFRNONAA >60 03/15/2018   GFRAA >60 03/15/2018       Imaging: As per HPI   Assessment/Plan:   1. Sigmoid colon cancer, stage IV (pT4a,pN2b,M1c)  Colonoscopy 03/24/2019-3 rectal polyps-hyperplastic polyps, distal colon biopsy-at least intramucosal adenocarcinoma, completely obstructing mass in the distal sigmoid colon, could not be traversed, intact mismatch repair protein expression  03/24/2019-CEA 56.1  03/29/2019 CT abdomen/pelvis-circumferential thickening involving the entire mid and distal sigmoid colon to the level of the rectosigmoid junction, solid/cystic mass in the left ovary, bilateral nephrolithiasis  Robotic assisted low anterior resection, mesenteric lymphadenectomy, bilateral salpingo-oophorectomy 04/08/2019  Pathology ([facility] review of outside pathology) metastatic adenocarcinoma involving the peritoneum overlying the round ligament, serosa of the urinary bladder, serosa of the right ureter a sacral area, pelvic peritoneum, and left ovary.  Omental biopsy with focal mucin pools with no carcinoma cells identified, right hemidiaphragm biopsy involved by metastatic adenocarcinoma, invasive adenocarcinoma the sigmoid colon, moderately differentiated, T4a, perineural and vascular invasion present, 7/22 lymph nodes, multiple tumor deposits, resection margins negative  Negative for PD-L1, low probability of MSI-high, HER-2 negative, negative for BRAF, NRAS and KRAS alterations  CTs 05/19/2019-no evidence of metastatic disease, findings suspicious for colitis of the transverse and ascending colon, small amount of ascites in the cul-de-sac, bilateral renal calculi  Cycle 1 FOLFIRI 05/24/2019, bevacizumab added with cycle 2  Cycle 4 FOLFIRI/bevacizumab 07/05/2019  2. Hypertension 3.  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 Kidney stones   Disposition:   Ms. Filla was diagnosed with metastatic colon cancer in March of this year.  She underwent a low anterior resection and bilateral salpingo-oophorectomy.  Metastatic disease was confirmed to involve  the left ovary and peritoneum.  A restaging CT following surgery revealed no measurable disease.  She has completed 4 cycles of FOLFIRI, 3 with bevacizumab.  Ms. Harkless appears asymptomatic from the metastatic colon cancer.  She understands no therapy will be curative.  We discussed treatment options.  The plan is to complete 2 more cycles of FOLFIRI prior to a restaging CT evaluation.  We will then decide on continuing the current treatment, maintenance therapy, or changing to a different systemic therapy regimen.  We will obtain results from molecular testing and the chemotherapy records from [place].  I will present her case at the GI tumor conference.  Ms. Klahr had severe hypertension in the office today.  She has a history of hypertension, likely complicated by bevacizumab.  Ms. Pick reports lip swelling with lisinopril.  I consulted with the nephrology service.  She continue the current antihypertensive regimen and we will add hydralazine.  I encouraged her to obtain a primary care physician for ongoing management of hypertension.  Bevacizumab will be held from the systemic therapy regimen if she has hypertension when she returns for treatment on 07/27/2019.  A chemotherapy plan was entered today.  Betsy Coder, MD  07/21/2019, 8:46 AM

## 2019-07-21 NOTE — Progress Notes (Signed)
Met with patient at her initial medical oncology appointment with Dr. Benay Spice today.  I had previously spoken with her on the phone but I explained my role as nurse navigator and she was given my card with my direct contact information.    I have requested [facility], Therapist, sports and Oncology flow sheets from [facility] in [place] via faxed request to [phone] be faxed to me for Dr. Gearldine Shown review.

## 2019-07-21 NOTE — Telephone Encounter (Signed)
Scheduled per 7/8 los. Unable to reach pt. Left voicemail with appt times and dates.

## 2019-07-21 NOTE — Telephone Encounter (Addendum)
Per Dr. Benay Spice: Begin hydralazine 10 mg tid. Needs to obtain a PCP asap. Needs BP re-check on Monday. Left VM that additional BP med for hydralazine 10 mg tid was sent to her pharmacy and she needs to pick this up asap and get started. Continue to work on getting a PCP in the area asap as well. Requested she come to office for RN to check her BP as well anytime Monday. Requested she return call to confirm she received message.

## 2019-07-22 ENCOUNTER — Encounter: Payer: Self-pay | Admitting: General Practice

## 2019-07-22 ENCOUNTER — Telehealth: Payer: Self-pay

## 2019-07-22 NOTE — Progress Notes (Addendum)
[facility] Initial Psychosocial Assessment Clinical Social Work  Clinical Social Work contacted by phone to assess psychosocial, emotional, mental health, and spiritual needs of the patient.   Barriers to care/review of distress screen:  - Transportation:  Do you anticipate any problems getting to appointments?  Do you have someone who can help run errands for you if you need it?  Needs help w transportation to/from appointments.   - Help at home:  What is your living situation (alone, family, other)?  If you are physically unable to care for yourself, who would you call on to help you?  Lives w 46 year old twin daughters, also takes care of 93 year old grandchild.   - Support system:  What does your support system look like?  Who would you call on if you needed some kind of practical help?  What if you needed someone to talk to for emotional support?  Has 46 year old twins, "I don't think they are fully aware of what is going on with me and it is hard to talk w them about it."  Has mother and sister in [place] who are supportive.  Childrens father is deceased.   - Finances:  Are you concerned about finances.  Considering returning to work?  If not, applying for disability?  Receives disability due to cancer diagnosis.    What is your understanding of where you are with your cancer? Its cause?  Your treatment plan and what happens next?  Diagnosed w Stage IV colon cancer in March 2021 in [place].  Had been unable to have bowel movement since 2019, brought to attention of MDs but diagnosis of cancer was not established until 2021.  Felt MDs did not listen to her concerns, wishes cancer could have been caught earlier but "it is in [place]."  Began chemotherapy in [place], moved to [place] in June 2021 in order to be closer to family, is establishing care at [facility].  Feels supported and listened to thus far at [facility].    What are your worries for the future as you begin treatment for cancer?   Worried about the impact on her children, "how are they going to take it."  Major concern is mortality and leaving her children without a parent.  Has 46 year old twins, their father is also deceased.   What are your hopes and priorities during your treatment? What is important to you? What are your goals for your care?  Managing care and support of children in addition to managing her own treatment.    CSW Summary:  Patient and family psychosocial functioning including strengths, limitations, and coping skills:  46 year old single mother, newly diagnosed w Stage IV colon cancer, will begin chemotherapy at [facility].  Understandably worried about impact of her illness on her daughters who have also lost their father.  Some support from family, but will need help w various psychosocial issues.  Receives SSI and Medicaid.  Moved to [facility] June 2021, does not know how her funds will cover current living expenses as she has not gotten bills yet.  Feels alone and somewhat overwhelmed, becomes tearful easily.  CSW and patient discussed common feeling and emotions when being diagnosed with cancer, and the importance of support during treatment. CSW informed patient of the support team and support services at [facility]. CSW provided contact information and encouraged patient to call with any questions or concerns.  Identifications of barriers to care:  Single mother, teenage twins.  Lack of transportation  Availability of community resources: referrals made to [employer] referral, [place], [place] for counseling to support her as she supports her children, Living w Cancer support group and [place] resources, [facility] Social Worker follow up needed: Yes, will schedule time to meet w her re parenting support and other needs  Edwyna Shell, [place] Worker Phone:  [phone] Cell:  [phone]

## 2019-07-22 NOTE — Telephone Encounter (Signed)
   April [name] DOB: 24-Jul-1975 MRN: [id number]   RIDER WAIVER AND RELEASE OF LIABILITY  For purposes of improving physical access to our facilities, [facility] is pleased to partner with third parties to provide [place] patients or other authorized individuals the option of convenient, on-demand ground transportation services (the [place]") through use of the technology service that enables users to request on-demand ground transportation from independent third-party providers.  By opting to use and accept these [employer], I, the undersigned, hereby agree on behalf of myself, and on behalf of any minor child using the [employer] for whom I am the parent or legal guardian, as follows:  1. Government social research officer provided to me are provided by independent third-party transportation providers who are not [employer] or employees and who are unaffiliated with [employer]. 2. [facility] is neither a transportation carrier nor a common or public carrier. 3. [facility] has no control over the quality or safety of the transportation that occurs as a result of the [employer]. 4. [facility] cannot guarantee that any third-party transportation provider will complete any arranged transportation service. 5. [facility] makes no representation, warranty, or guarantee regarding the reliability, timeliness, quality, safety, suitability, or availability of any of the Transport Services or that they will be error free. 6. I fully understand that traveling by vehicle involves risks and dangers of serious bodily injury, including permanent disability, paralysis, and death. I agree, on behalf of myself and on behalf of any minor child using the Transport Services for whom I am the parent or legal guardian, that the entire risk arising out of my use of the [employer] remains solely with me, to the maximum extent permitted under applicable law. 7. The [employer] are provided "as is" and "as available." [facility] disclaims all representations and warranties, express, implied or statutory, not expressly set out in these terms, including the implied warranties of merchantability and fitness for a particular purpose. 8. I hereby waive and release [facility], its agents, employees, officers, directors, representatives, insurers, attorneys, assigns, successors, subsidiaries, and affiliates from any and all past, present, or future claims, demands, liabilities, actions, causes of action, or suits of any kind directly or indirectly arising from acceptance and use of the [employer]. 9. I further waive and release [place] and its affiliates from all present and future liability and responsibility for any injury or death to persons or damages to property caused by or related to the use of the [employer]. 10. I have read this Waiver and Release of Liability, and I understand the terms used in it and their legal significance. This Waiver is freely and voluntarily given with the understanding that my right (as well as the right of any minor child for whom I am the parent or legal guardian using the [employer]) to legal recourse against  in connection with the [employer] is knowingly surrendered in return for use of these services.   I attest that I read the consent document to April [name], gave Ms. Teska the opportunity to ask questions and answered the questions asked (if any). I affirm that April [name] then provided consent for she's participation in this program.     Drucie Ip

## 2019-07-22 NOTE — Progress Notes (Signed)
[facility] CSW Progress Notes  Email from [employer], she has been enrolled in their service and can schedule rides as needed by calling [phone].  Edwyna Shell, LCSW Clinical Social Worker Phone:  [phone]

## 2019-07-24 ENCOUNTER — Other Ambulatory Visit: Payer: Self-pay | Admitting: Oncology

## 2019-07-26 ENCOUNTER — Encounter: Payer: Self-pay | Admitting: *Deleted

## 2019-07-26 NOTE — Progress Notes (Signed)
Pharmacist Chemotherapy Monitoring - Initial Assessment    Anticipated start date: 07/27/19 (Continuation of therapy at outside facility, last FOLFIRI/bevacizumab 07/05/19 per [facility])  Regimen:  . Are orders appropriate based on the patient's diagnosis, regimen, and cycle? Yes . Does the plan date match the patient's scheduled date? Yes . Is the sequencing of drugs appropriate? Yes . Are the premedications appropriate for the patient's regimen? Yes . Prior Authorization for treatment is: Approved o If applicable, is the correct biosimilar selected based on the patient's insurance? yes  Organ Function and Labs: Marland Kitchen Are dose adjustments needed based on the patient's renal function, hepatic function, or hematologic function? No . Are appropriate labs ordered prior to the start of patient's treatment? Yes . Other organ system assessment, if indicated: bevacizumab: baseline BP . The following baseline labs, if indicated, have been ordered: bevacizumab: urine protein  Dose Assessment: . Are the drug doses appropriate? Yes . Are the following correct: o Drug concentrations Yes o IV fluid compatible with drug Yes o Administration routes Yes o Timing of therapy Yes . If applicable, does the patient have documented access for treatment and/or plans for port-a-cath placement? yes . If applicable, have lifetime cumulative doses been properly documented and assessed? not applicable Lifetime Dose Tracking  No doses have been documented on this patient for the following tracked chemicals: Doxorubicin, Epirubicin, Idarubicin, Daunorubicin, Mitoxantrone, Bleomycin, Oxaliplatin, Carboplatin, Liposomal Doxorubicin  o   Toxicity Monitoring/Prevention: . The patient has the following take home antiemetics prescribed: Ondansetron and Prochlorperazine . The patient has the following take home medications prescribed: N/A . Medication allergies and previous infusion related reactions, if applicable,  have been reviewed and addressed. Yes . The patient's current medication list has been assessed for drug-drug interactions with their chemotherapy regimen. no significant drug-drug interactions were identified on review.  Order Review: . Are the treatment plan orders signed? Yes . Is the patient scheduled to see a provider prior to their treatment? Yes   Spoke w/ Dr. Benay Spice, patient required fosaprepitant (along with aloxi and dexamethasone) for history of [place] w/ FOLFIRI at outside facility. She also received atropine. She will receive both here with transfer of care.   I verify that I have reviewed each item in the above checklist and answered each question accordingly.  [facility] 07/26/2019 10:35 AM

## 2019-07-26 NOTE — Progress Notes (Signed)
[place] Work  Holiday representative contacted patient at home after expressed interest in GI support group.  CSW provided patient with information on GI support group, and at patients request added patient to the GI support group email list.  CSW and patient discussed additional resources and support services at [facility].  Patient also requested to be added to the monthly [facility] Support Calendar  mailing list.  Patient stated she was contacted by [employer], and plans to call them back today to discuss support for her twin daughters.  Patient expressed interest in wig resources.  CSW emailed patient wig resources list.  CSW provided contact information and encoaurged patient to call with needs or concerns.     Johnnye Lana, MSW, LCSW, OSW-C Clinical Social Worker [facility] [phone]

## 2019-07-27 ENCOUNTER — Other Ambulatory Visit: Payer: Self-pay

## 2019-07-27 ENCOUNTER — Telehealth: Payer: Self-pay | Admitting: General Practice

## 2019-07-27 ENCOUNTER — Other Ambulatory Visit: Payer: Self-pay | Admitting: Oncology

## 2019-07-27 ENCOUNTER — Inpatient Hospital Stay: Payer: Medicaid Other

## 2019-07-27 ENCOUNTER — Encounter: Payer: Self-pay | Admitting: Oncology

## 2019-07-27 VITALS — BP 152/118 | HR 78 | Temp 98.4°F | Resp 17

## 2019-07-27 DIAGNOSIS — C20 Malignant neoplasm of rectum: Secondary | ICD-10-CM

## 2019-07-27 DIAGNOSIS — Z95828 Presence of other vascular implants and grafts: Secondary | ICD-10-CM

## 2019-07-27 DIAGNOSIS — C19 Malignant neoplasm of rectosigmoid junction: Secondary | ICD-10-CM

## 2019-07-27 DIAGNOSIS — Z5111 Encounter for antineoplastic chemotherapy: Secondary | ICD-10-CM | POA: Diagnosis present

## 2019-07-27 DIAGNOSIS — C187 Malignant neoplasm of sigmoid colon: Secondary | ICD-10-CM | POA: Diagnosis present

## 2019-07-27 LAB — CBC WITH DIFFERENTIAL (CANCER CENTER ONLY)
Abs Immature Granulocytes: 0 10*3/uL (ref 0.00–0.07)
Basophils Absolute: 0.1 10*3/uL (ref 0.0–0.1)
Basophils Relative: 1 %
Eosinophils Absolute: 0.3 10*3/uL (ref 0.0–0.5)
Eosinophils Relative: 5 %
HCT: 38.4 % (ref 36.0–46.0)
Hemoglobin: 12.9 g/dL (ref 12.0–15.0)
Immature Granulocytes: 0 %
Lymphocytes Relative: 52 %
Lymphs Abs: 3 10*3/uL (ref 0.7–4.0)
MCH: 31.5 pg (ref 26.0–34.0)
MCHC: 33.6 g/dL (ref 30.0–36.0)
MCV: 93.7 fL (ref 80.0–100.0)
Monocytes Absolute: 0.6 10*3/uL (ref 0.1–1.0)
Monocytes Relative: 10 %
Neutro Abs: 1.9 10*3/uL (ref 1.7–7.7)
Neutrophils Relative %: 32 %
Platelet Count: 200 10*3/uL (ref 150–400)
RBC: 4.1 MIL/uL (ref 3.87–5.11)
RDW: 14.1 % (ref 11.5–15.5)
WBC Count: 5.8 10*3/uL (ref 4.0–10.5)
nRBC: 0 % (ref 0.0–0.2)

## 2019-07-27 LAB — CMP (CANCER CENTER ONLY)
ALT: 8 U/L (ref 0–44)
AST: 13 U/L — ABNORMAL LOW (ref 15–41)
Albumin: 4 g/dL (ref 3.5–5.0)
Alkaline Phosphatase: 123 U/L (ref 38–126)
Anion gap: 11 (ref 5–15)
BUN: 15 mg/dL (ref 6–20)
CO2: 28 mmol/L (ref 22–32)
Calcium: 9.8 mg/dL (ref 8.9–10.3)
Chloride: 102 mmol/L (ref 98–111)
Creatinine: 0.82 mg/dL (ref 0.44–1.00)
GFR, Est AFR Am: >60 mL/min (ref >60)
GFR, Estimated: >60 mL/min (ref >60)
Glucose, Bld: 88 mg/dL (ref 70–99)
Potassium: 3.4 mmol/L — ABNORMAL LOW (ref 3.5–5.1)
Sodium: 141 mmol/L (ref 135–145)
Total Bilirubin: <0.2 mg/dL — ABNORMAL LOW (ref 0.3–1.2)
Total Protein: 7.8 g/dL (ref 6.5–8.1)

## 2019-07-27 LAB — CEA (IN HOUSE-CHCC): CEA (CHCC-In House): 3.26 ng/mL (ref 0.00–5.00)

## 2019-07-27 MED ORDER — SODIUM CHLORIDE 0.9 % IV SOLN
150.0000 mg | Freq: Once | INTRAVENOUS | Status: AC
  Administered 2019-07-27: 150 mg via INTRAVENOUS
  Filled 2019-07-27: qty 150

## 2019-07-27 MED ORDER — SODIUM CHLORIDE 0.9 % IV SOLN
400.0000 mg/m2 | Freq: Once | INTRAVENOUS | Status: AC
  Administered 2019-07-27: 668 mg via INTRAVENOUS
  Filled 2019-07-27: qty 33.4

## 2019-07-27 MED ORDER — SODIUM CHLORIDE 0.9 % IV SOLN
280.0000 mg | Freq: Once | INTRAVENOUS | Status: AC
  Administered 2019-07-27: 280 mg via INTRAVENOUS
  Filled 2019-07-27: qty 14

## 2019-07-27 MED ORDER — ATROPINE SULFATE 1 MG/ML IJ SOLN
0.5000 mg | Freq: Once | INTRAMUSCULAR | Status: AC
  Administered 2019-07-27: 0.5 mg via INTRAVENOUS

## 2019-07-27 MED ORDER — FLUOROURACIL CHEMO INJECTION 2.5 GM/50ML
400.0000 mg/m2 | Freq: Once | INTRAVENOUS | Status: AC
  Administered 2019-07-27: 650 mg via INTRAVENOUS
  Filled 2019-07-27: qty 13

## 2019-07-27 MED ORDER — PALONOSETRON HCL INJECTION 0.25 MG/5ML
0.2500 mg | Freq: Once | INTRAVENOUS | Status: AC
  Administered 2019-07-27: 0.25 mg via INTRAVENOUS

## 2019-07-27 MED ORDER — PALONOSETRON HCL INJECTION 0.25 MG/5ML
INTRAVENOUS | Status: AC
  Filled 2019-07-27: qty 5

## 2019-07-27 MED ORDER — SODIUM CHLORIDE 0.9% FLUSH
10.0000 mL | INTRAVENOUS | Status: DC | PRN
  Administered 2019-07-27: 10 mL
  Filled 2019-07-27: qty 10

## 2019-07-27 MED ORDER — SODIUM CHLORIDE 0.9 % IV SOLN
Freq: Once | INTRAVENOUS | Status: AC
  Filled 2019-07-27: qty 250

## 2019-07-27 MED ORDER — SODIUM CHLORIDE 0.9 % IV SOLN
2400.0000 mg/m2 | INTRAVENOUS | Status: DC
  Administered 2019-07-27: 4000 mg via INTRAVENOUS
  Filled 2019-07-27: qty 80

## 2019-07-27 MED ORDER — SODIUM CHLORIDE 0.9 % IV SOLN
10.0000 mg | Freq: Once | INTRAVENOUS | Status: AC
  Administered 2019-07-27: 10 mg via INTRAVENOUS
  Filled 2019-07-27: qty 10

## 2019-07-27 NOTE — Patient Instructions (Signed)
[facility] Discharge Instructions for Patients Receiving Chemotherapy  Today you received the following chemotherapy agents: Irinotecan, leucovorin, 5FU  To help prevent nausea and vomiting after your treatment, we encourage you to take your nausea medication as directed.   If you develop nausea and vomiting that is not controlled by your nausea medication, call the clinic.   BELOW ARE SYMPTOMS THAT SHOULD BE REPORTED IMMEDIATELY:  *FEVER GREATER THAN 100.5 F  *CHILLS WITH OR WITHOUT FEVER  NAUSEA AND VOMITING THAT IS NOT CONTROLLED WITH YOUR NAUSEA MEDICATION  *UNUSUAL SHORTNESS OF BREATH  *UNUSUAL BRUISING OR BLEEDING  TENDERNESS IN MOUTH AND THROAT WITH OR WITHOUT PRESENCE OF ULCERS  *URINARY PROBLEMS  *BOWEL PROBLEMS  UNUSUAL RASH Items with * indicate a potential emergency and should be followed up as soon as possible.  Feel free to call the clinic should you have any questions or concerns. The clinic phone number is [phone].  Please show the CHEMO ALERT CARD at check-in to the Emergency Department and triage nurse.   

## 2019-07-27 NOTE — Progress Notes (Signed)
Due to elevated BP Dr. Benay Spice is going to remove the bevacizumab off the treatment plan. Patient has not picked up or started the Hydralazine that Dr. Benay Spice ordered on 7/8 and he is aware of that. Patient has also not established with a PCP at this point. Will reach out to Alphonzo Grieve per Dr. Benay Spice request for assist with finding a PCP. Patient is aware os the above information and to pick up the hydralazine today. Katie spoke with patient in the infusion room to assist with establishing a PCP.

## 2019-07-27 NOTE — Telephone Encounter (Signed)
Contacted patient to verify telephone visit for pre reg °

## 2019-07-27 NOTE — Progress Notes (Signed)
Met with patient at registration to introduce myself as Financial Resource Specialist and to offer available resources.  Discussed one-time $1000 [employer] grant and qualifications to assist with personal expenses while going through treatment.  Gave her my card if interested in applying and for any additional financial questions or concerns. 

## 2019-07-27 NOTE — Patient Instructions (Signed)

## 2019-07-28 ENCOUNTER — Encounter: Payer: Self-pay | Admitting: Oncology

## 2019-07-28 ENCOUNTER — Inpatient Hospital Stay: Payer: Medicaid Other | Admitting: General Practice

## 2019-07-28 DIAGNOSIS — C19 Malignant neoplasm of rectosigmoid junction: Secondary | ICD-10-CM

## 2019-07-28 NOTE — Progress Notes (Signed)
[facility] Progress Notes  Call to patient to check in.  Overall, she is doing well.  She has connected w [facility] and found information helpful as she supports her 46 yo twin daughters re her cancer diagnosis.  She is working on qualifying for the [employer].  She has no current financial concerns.  Her children and granddaughter are sources of encouragement for her. She has connected w CSW [place] re GI support group.   No further concerns at this time.    Edwyna Shell, LCSW Clinical Social Worker Phone:  [phone]

## 2019-07-28 NOTE — Progress Notes (Signed)
Returned call to patient from voicemail left regarding applying for grant.  Based on verbal income, patient does qualify. Advised what is needed. Patient states she can bring tomorrow. She will meet with Lenise in my absence. Discussed grant and how expenses are covered.  She has my card for any additional financial questions or concerns.

## 2019-07-29 ENCOUNTER — Encounter: Payer: Self-pay | Admitting: Oncology

## 2019-07-29 ENCOUNTER — Inpatient Hospital Stay: Payer: Medicaid Other

## 2019-07-29 ENCOUNTER — Other Ambulatory Visit: Payer: Self-pay

## 2019-07-29 DIAGNOSIS — C19 Malignant neoplasm of rectosigmoid junction: Secondary | ICD-10-CM

## 2019-07-29 DIAGNOSIS — Z5111 Encounter for antineoplastic chemotherapy: Secondary | ICD-10-CM | POA: Diagnosis not present

## 2019-07-29 MED ORDER — SODIUM CHLORIDE 0.9% FLUSH
10.0000 mL | INTRAVENOUS | Status: DC | PRN
  Administered 2019-07-29: 10 mL
  Filled 2019-07-29: qty 10

## 2019-07-29 MED ORDER — HEPARIN SOD (PORK) LOCK FLUSH 100 UNIT/ML IV SOLN
500.0000 [IU] | Freq: Once | INTRAVENOUS | Status: AC | PRN
  Administered 2019-07-29: 500 [IU]
  Filled 2019-07-29: qty 5

## 2019-07-29 NOTE — Progress Notes (Signed)
Pt is approved for the $1000 [employer] grant.  

## 2019-07-29 NOTE — Patient Instructions (Signed)

## 2019-08-02 ENCOUNTER — Ambulatory Visit: Payer: Medicaid Other | Admitting: Family

## 2019-08-07 ENCOUNTER — Other Ambulatory Visit: Payer: Self-pay | Admitting: Oncology

## 2019-08-10 ENCOUNTER — Inpatient Hospital Stay: Payer: Medicaid Other

## 2019-08-10 ENCOUNTER — Inpatient Hospital Stay (HOSPITAL_BASED_OUTPATIENT_CLINIC_OR_DEPARTMENT_OTHER): Payer: Medicaid Other | Admitting: Oncology

## 2019-08-10 ENCOUNTER — Other Ambulatory Visit: Payer: Self-pay | Admitting: *Deleted

## 2019-08-10 ENCOUNTER — Other Ambulatory Visit: Payer: Self-pay

## 2019-08-10 VITALS — BP 140/88 | HR 88 | Temp 98.1°F | Resp 17 | Ht 67.5 in | Wt 133.0 lb

## 2019-08-10 DIAGNOSIS — Z5111 Encounter for antineoplastic chemotherapy: Secondary | ICD-10-CM | POA: Diagnosis not present

## 2019-08-10 DIAGNOSIS — C19 Malignant neoplasm of rectosigmoid junction: Secondary | ICD-10-CM

## 2019-08-10 DIAGNOSIS — C20 Malignant neoplasm of rectum: Secondary | ICD-10-CM

## 2019-08-10 DIAGNOSIS — Z95828 Presence of other vascular implants and grafts: Secondary | ICD-10-CM

## 2019-08-10 LAB — CMP (CANCER CENTER ONLY)
ALT: 11 U/L (ref 0–44)
AST: 17 U/L (ref 15–41)
Albumin: 3.8 g/dL (ref 3.5–5.0)
Alkaline Phosphatase: 106 U/L (ref 38–126)
Anion gap: 8 (ref 5–15)
BUN: 19 mg/dL (ref 6–20)
CO2: 28 mmol/L (ref 22–32)
Calcium: 9.5 mg/dL (ref 8.9–10.3)
Chloride: 100 mmol/L (ref 98–111)
Creatinine: 0.77 mg/dL (ref 0.44–1.00)
GFR, Est AFR Am: >60 mL/min (ref >60)
GFR, Estimated: >60 mL/min (ref >60)
Glucose, Bld: 84 mg/dL (ref 70–99)
Potassium: 4 mmol/L (ref 3.5–5.1)
Sodium: 136 mmol/L (ref 135–145)
Total Bilirubin: <0.2 mg/dL — ABNORMAL LOW (ref 0.3–1.2)
Total Protein: 7 g/dL (ref 6.5–8.1)

## 2019-08-10 LAB — CBC WITH DIFFERENTIAL (CANCER CENTER ONLY)
Abs Immature Granulocytes: 0.01 10*3/uL (ref 0.00–0.07)
Basophils Absolute: 0 10*3/uL (ref 0.0–0.1)
Basophils Relative: 1 %
Eosinophils Absolute: 0.3 10*3/uL (ref 0.0–0.5)
Eosinophils Relative: 5 %
HCT: 35.2 % — ABNORMAL LOW (ref 36.0–46.0)
Hemoglobin: 12 g/dL (ref 12.0–15.0)
Immature Granulocytes: 0 %
Lymphocytes Relative: 42 %
Lymphs Abs: 2.8 10*3/uL (ref 0.7–4.0)
MCH: 32.7 pg (ref 26.0–34.0)
MCHC: 34.1 g/dL (ref 30.0–36.0)
MCV: 95.9 fL (ref 80.0–100.0)
Monocytes Absolute: 0.6 10*3/uL (ref 0.1–1.0)
Monocytes Relative: 8 %
Neutro Abs: 2.9 10*3/uL (ref 1.7–7.7)
Neutrophils Relative %: 44 %
Platelet Count: 224 10*3/uL (ref 150–400)
RBC: 3.67 MIL/uL — ABNORMAL LOW (ref 3.87–5.11)
RDW: 14.4 % (ref 11.5–15.5)
WBC Count: 6.6 10*3/uL (ref 4.0–10.5)
nRBC: 0 % (ref 0.0–0.2)

## 2019-08-10 LAB — TOTAL PROTEIN, URINE DIPSTICK: Protein, ur: NEGATIVE mg/dL

## 2019-08-10 MED ORDER — SODIUM CHLORIDE 0.9 % IV SOLN
2400.0000 mg/m2 | INTRAVENOUS | Status: DC
  Administered 2019-08-10: 4000 mg via INTRAVENOUS
  Filled 2019-08-10: qty 80

## 2019-08-10 MED ORDER — SODIUM CHLORIDE 0.9 % IV SOLN
180.0000 mg/m2 | Freq: Once | INTRAVENOUS | Status: AC
  Administered 2019-08-10: 300 mg via INTRAVENOUS
  Filled 2019-08-10: qty 15

## 2019-08-10 MED ORDER — SODIUM CHLORIDE 0.9 % IV SOLN
10.0000 mg | Freq: Once | INTRAVENOUS | Status: AC
  Administered 2019-08-10: 10 mg via INTRAVENOUS
  Filled 2019-08-10: qty 10

## 2019-08-10 MED ORDER — SODIUM CHLORIDE 0.9 % IV SOLN
400.0000 mg/m2 | Freq: Once | INTRAVENOUS | Status: AC
  Administered 2019-08-10: 668 mg via INTRAVENOUS
  Filled 2019-08-10: qty 33.4

## 2019-08-10 MED ORDER — SODIUM CHLORIDE 0.9 % IV SOLN
Freq: Once | INTRAVENOUS | Status: AC
  Filled 2019-08-10: qty 250

## 2019-08-10 MED ORDER — ATROPINE SULFATE 1 MG/ML IJ SOLN
INTRAMUSCULAR | Status: AC
  Filled 2019-08-10: qty 1

## 2019-08-10 MED ORDER — PALONOSETRON HCL INJECTION 0.25 MG/5ML
0.2500 mg | Freq: Once | INTRAVENOUS | Status: AC
  Administered 2019-08-10: 0.25 mg via INTRAVENOUS

## 2019-08-10 MED ORDER — FLUOROURACIL CHEMO INJECTION 2.5 GM/50ML
400.0000 mg/m2 | Freq: Once | INTRAVENOUS | Status: AC
  Administered 2019-08-10: 650 mg via INTRAVENOUS
  Filled 2019-08-10: qty 13

## 2019-08-10 MED ORDER — METOPROLOL TARTRATE 25 MG PO TABS: 25.0000 mg | ORAL_TABLET | Freq: Two times a day (BID) | ORAL | 2 refills | Status: DC

## 2019-08-10 MED ORDER — ATROPINE SULFATE 1 MG/ML IJ SOLN
0.5000 mg | Freq: Once | INTRAMUSCULAR | Status: AC
  Administered 2019-08-10: 0.5 mg via INTRAVENOUS

## 2019-08-10 MED ORDER — SODIUM CHLORIDE 0.9% FLUSH
10.0000 mL | INTRAVENOUS | Status: DC | PRN
  Administered 2019-08-10: 10 mL via INTRAVENOUS
  Filled 2019-08-10: qty 10

## 2019-08-10 MED ORDER — PALONOSETRON HCL INJECTION 0.25 MG/5ML
INTRAVENOUS | Status: AC
  Filled 2019-08-10: qty 5

## 2019-08-10 MED ORDER — SODIUM CHLORIDE 0.9 % IV SOLN
150.0000 mg | Freq: Once | INTRAVENOUS | Status: AC
  Administered 2019-08-10: 150 mg via INTRAVENOUS
  Filled 2019-08-10: qty 150

## 2019-08-10 NOTE — Patient Instructions (Signed)

## 2019-08-10 NOTE — Progress Notes (Signed)
[facility] OFFICE PROGRESS NOTE   Diagnosis: Colon cancer  INTERVAL HISTORY:   April [name] completed a cycle of FOLFIRI on 07/27/2019.  She reports nausea for a few days beginning on day 3.  Nausea improved with Compazine.  She had one episode of cramping abdominal pain on day 3.  She developed tenderness at the Port-A-Cath site beginning 1 week ago.  The skin is "dark ".  No fever.  No neck tenderness. April [name] has noted hyperpigmentation of the hands and fingernails. She reports her blood pressure has been improved since starting hydralazine.  She is out of metoprolol.  Objective:  Vital signs in last 24 hours:  Blood pressure (!) 160/116, pulse 82, temperature 98.1 F (36.7 C), temperature source Temporal, resp. rate 17, height 5' 7.5" (1.715 m), weight 133 lb (60.3 kg), SpO2 100 %.    HEENT: No thrush or ulcers Resp: Lungs clear bilaterally Cardio: Regular rate and rhythm GI: No hepatosplenomegaly, nontender, no mass Vascular: No leg edema  Skin: Mild hyperpigmentation of the hands and nailbeds  Portacath/PICC-tender at the upper aspect of the port and near the subcutaneous hub.  No apparent erythema.  No tenderness along the subcutaneous tract.  No swelling in the right neck.  The Port-A-Cath is currently accessed with a dressing in place making complete examination difficult.  Lab Results:  Lab Results  Component Value Date   WBC 6.6 08/10/2019   HGB 12.0 08/10/2019   HCT 35.2 (L) 08/10/2019   MCV 95.9 08/10/2019   PLT 224 08/10/2019   NEUTROABS 2.9 08/10/2019    CMP  Lab Results  Component Value Date   NA 136 08/10/2019   K 4.0 08/10/2019   CL 100 08/10/2019   CO2 28 08/10/2019   GLUCOSE 84 08/10/2019   BUN 19 08/10/2019   CREATININE 0.77 08/10/2019   CALCIUM 9.5 08/10/2019   PROT 7.0 08/10/2019   ALBUMIN 3.8 08/10/2019   AST 17 08/10/2019   ALT 11 08/10/2019   ALKPHOS 106 08/10/2019   BILITOT <0.2 (L) 08/10/2019   GFRNONAA  >60 08/10/2019   GFRAA >60 08/10/2019    Lab Results  Component Value Date   CEA1 3.26 07/27/2019    Medications: I have reviewed the patient's current medications.   Assessment/Plan: 1. Sigmoid colon cancer, stage IV (pT4a,pN2b,M1c)  Colonoscopy 03/24/2019-3 rectal polyps-hyperplastic polyps, distal colon biopsy-at least intramucosal adenocarcinoma, completely obstructing mass in the distal sigmoid colon, could not be traversed, intact mismatch repair protein expression  03/24/2019-CEA 56.1  03/29/2019 CT abdomen/pelvis-circumferential thickening involving the entire mid and distal sigmoid colon to the level of the rectosigmoid junction, solid/cystic mass in the left ovary, bilateral nephrolithiasis  Robotic assisted low anterior resection, mesenteric lymphadenectomy, bilateral salpingo-oophorectomy 04/08/2019  Pathology ([facility] review of outside pathology) metastatic adenocarcinoma involving the peritoneum overlying the round ligament, serosa of the urinary bladder, serosa of the right ureter a sacral area, pelvic peritoneum, and left ovary.  Omental biopsy with focal mucin pools with no carcinoma cells identified, right hemidiaphragm biopsy involved by metastatic adenocarcinoma, invasive adenocarcinoma the sigmoid colon, moderately differentiated, T4a, perineural and vascular invasion present, 7/22 lymph nodes, multiple tumor deposits, resection margins negative  Negative for PD-L1, low probability of MSI-high, HER-2 negative, negative for BRAF, NRAS and KRAS alterations  CTs 05/19/2019-no evidence of metastatic disease, findings suspicious for colitis of the transverse and ascending colon, small amount of ascites in the cul-de-sac, bilateral renal calculi  Cycle 1 FOLFIRI 05/24/2019, bevacizumab added with cycle 2  Cycle 4 FOLFIRI/bevacizumab 07/05/2019  Cycle 5 FOLFIRI 07/27/2019, bevacizumab held secondary to hypertension  2. 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 Kidney  stones    Disposition: April [name] has completed 5 cycles of FOLFIRI.  She appears to be tolerating the chemotherapy well.  She will complete cycle 6 today.  She will undergo a restaging CT evaluation after this cycle.  April [name] has persistent severe hypertension.  We will refill the prescription for metoprolol.  We will repeat her blood pressure while she is here today.  I recommend she follow-up with a primary provider for management of hypertension.  Bevacizumab will remain on hold.  She has developed tenderness at the Port-A-Cath.  There is no apparent infection or thrombosis on exam today.  I will reexamine the Port-A-Cath when she is here for the pump disconnect on 08/12/2019.  I encouraged her to obtain the COVID-19 vaccine.  Betsy Coder, MD  08/10/2019  9:55 AM

## 2019-08-10 NOTE — Progress Notes (Signed)
patient stated her port on lateral side was tinder, it did look a little bruised. Patient denies any fever, chills at this time Made MD nurse aware.

## 2019-08-10 NOTE — Patient Instructions (Signed)
[facility] Discharge Instructions for Patients Receiving Chemotherapy  Today you received the following chemotherapy agents: irinotecan/leucovorin/fluorouracil.  To help prevent nausea and vomiting after your treatment, we encourage you to take your nausea medication as directed.   If you develop nausea and vomiting that is not controlled by your nausea medication, call the clinic.   BELOW ARE SYMPTOMS THAT SHOULD BE REPORTED IMMEDIATELY:  *FEVER GREATER THAN 100.5 F  *CHILLS WITH OR WITHOUT FEVER  NAUSEA AND VOMITING THAT IS NOT CONTROLLED WITH YOUR NAUSEA MEDICATION  *UNUSUAL SHORTNESS OF BREATH  *UNUSUAL BRUISING OR BLEEDING  TENDERNESS IN MOUTH AND THROAT WITH OR WITHOUT PRESENCE OF ULCERS  *URINARY PROBLEMS  *BOWEL PROBLEMS  UNUSUAL RASH Items with * indicate a potential emergency and should be followed up as soon as possible.  Feel free to call the clinic should you have any questions or concerns. The clinic phone number is [phone].  Please show the CHEMO ALERT CARD at check-in to the Emergency Department and triage nurse.  

## 2019-08-12 ENCOUNTER — Telehealth: Payer: Self-pay | Admitting: Oncology

## 2019-08-12 ENCOUNTER — Inpatient Hospital Stay: Payer: Medicaid Other

## 2019-08-12 ENCOUNTER — Other Ambulatory Visit: Payer: Self-pay

## 2019-08-12 VITALS — BP 109/83 | HR 73 | Temp 99.1°F | Resp 18

## 2019-08-12 DIAGNOSIS — Z5111 Encounter for antineoplastic chemotherapy: Secondary | ICD-10-CM | POA: Diagnosis not present

## 2019-08-12 DIAGNOSIS — C19 Malignant neoplasm of rectosigmoid junction: Secondary | ICD-10-CM

## 2019-08-12 MED ORDER — HEPARIN SOD (PORK) LOCK FLUSH 100 UNIT/ML IV SOLN
500.0000 [IU] | Freq: Once | INTRAVENOUS | Status: AC | PRN
  Administered 2019-08-12: 500 [IU]
  Filled 2019-08-12: qty 5

## 2019-08-12 MED ORDER — SODIUM CHLORIDE 0.9% FLUSH
10.0000 mL | INTRAVENOUS | Status: DC | PRN
  Administered 2019-08-12: 10 mL
  Filled 2019-08-12: qty 10

## 2019-08-12 NOTE — Patient Instructions (Signed)

## 2019-08-12 NOTE — Telephone Encounter (Signed)
Scheduled appts per 7/28 los. Pt confirmed appt dates and times.

## 2019-08-28 ENCOUNTER — Other Ambulatory Visit: Payer: Self-pay | Admitting: Oncology

## 2019-08-29 ENCOUNTER — Inpatient Hospital Stay: Payer: Medicaid Other

## 2019-08-29 ENCOUNTER — Telehealth: Payer: Self-pay | Admitting: Oncology

## 2019-08-29 NOTE — Telephone Encounter (Signed)
R/s appt per 8/16 sch msg - left message for patient with appt date and time  Unable to reach pt .

## 2019-08-30 ENCOUNTER — Inpatient Hospital Stay: Payer: Medicaid Other

## 2019-08-30 ENCOUNTER — Inpatient Hospital Stay: Payer: Medicaid Other | Attending: Oncology

## 2019-08-30 ENCOUNTER — Encounter (HOSPITAL_COMMUNITY): Payer: Self-pay

## 2019-08-30 ENCOUNTER — Other Ambulatory Visit: Payer: Self-pay

## 2019-08-30 ENCOUNTER — Ambulatory Visit (HOSPITAL_COMMUNITY)
Admission: RE | Admit: 2019-08-30 | Discharge: 2019-08-30 | Disposition: A | Discharge status: Home / Self Care | Payer: Medicaid Other | Source: Ambulatory Visit | Attending: Oncology | Admitting: Oncology

## 2019-08-30 DIAGNOSIS — C187 Malignant neoplasm of sigmoid colon: Secondary | ICD-10-CM | POA: Diagnosis present

## 2019-08-30 DIAGNOSIS — T80219A Unspecified infection due to central venous catheter, initial encounter: Secondary | ICD-10-CM | POA: Insufficient documentation

## 2019-08-30 DIAGNOSIS — I10 Essential (primary) hypertension: Secondary | ICD-10-CM | POA: Insufficient documentation

## 2019-08-30 DIAGNOSIS — Y848 Other medical procedures as the cause of abnormal reaction of the patient, or of later complication, without mention of misadventure at the time of the procedure: Secondary | ICD-10-CM | POA: Insufficient documentation

## 2019-08-30 DIAGNOSIS — E876 Hypokalemia: Secondary | ICD-10-CM | POA: Insufficient documentation

## 2019-08-30 DIAGNOSIS — C19 Malignant neoplasm of rectosigmoid junction: Secondary | ICD-10-CM

## 2019-08-30 DIAGNOSIS — B999 Unspecified infectious disease: Secondary | ICD-10-CM | POA: Diagnosis not present

## 2019-08-30 DIAGNOSIS — R110 Nausea: Secondary | ICD-10-CM | POA: Insufficient documentation

## 2019-08-30 DIAGNOSIS — R911 Solitary pulmonary nodule: Secondary | ICD-10-CM | POA: Insufficient documentation

## 2019-08-30 DIAGNOSIS — N200 Calculus of kidney: Secondary | ICD-10-CM | POA: Diagnosis not present

## 2019-08-30 LAB — CBC WITH DIFFERENTIAL (CANCER CENTER ONLY)
Abs Immature Granulocytes: 0.02 10*3/uL (ref 0.00–0.07)
Basophils Absolute: 0.1 10*3/uL (ref 0.0–0.1)
Basophils Relative: 1 %
Eosinophils Absolute: 0.3 10*3/uL (ref 0.0–0.5)
Eosinophils Relative: 5 %
HCT: 34.5 % — ABNORMAL LOW (ref 36.0–46.0)
Hemoglobin: 11.4 g/dL — ABNORMAL LOW (ref 12.0–15.0)
Immature Granulocytes: 0 %
Lymphocytes Relative: 57 %
Lymphs Abs: 3.3 10*3/uL (ref 0.7–4.0)
MCH: 31.7 pg (ref 26.0–34.0)
MCHC: 33 g/dL (ref 30.0–36.0)
MCV: 95.8 fL (ref 80.0–100.0)
Monocytes Absolute: 0.5 10*3/uL (ref 0.1–1.0)
Monocytes Relative: 9 %
Neutro Abs: 1.6 10*3/uL — ABNORMAL LOW (ref 1.7–7.7)
Neutrophils Relative %: 28 %
Platelet Count: 192 10*3/uL (ref 150–400)
RBC: 3.6 MIL/uL — ABNORMAL LOW (ref 3.87–5.11)
RDW: 14.6 % (ref 11.5–15.5)
WBC Count: 5.8 10*3/uL (ref 4.0–10.5)
nRBC: 0 % (ref 0.0–0.2)

## 2019-08-30 LAB — CMP (CANCER CENTER ONLY)
ALT: 11 U/L (ref 0–44)
AST: 14 U/L — ABNORMAL LOW (ref 15–41)
Albumin: 3.6 g/dL (ref 3.5–5.0)
Alkaline Phosphatase: 92 U/L (ref 38–126)
Anion gap: 9 (ref 5–15)
BUN: 9 mg/dL (ref 6–20)
CO2: 27 mmol/L (ref 22–32)
Calcium: 10 mg/dL (ref 8.9–10.3)
Chloride: 104 mmol/L (ref 98–111)
Creatinine: 0.84 mg/dL (ref 0.44–1.00)
GFR, Est AFR Am: >60 mL/min (ref >60)
GFR, Estimated: >60 mL/min (ref >60)
Glucose, Bld: 104 mg/dL — ABNORMAL HIGH (ref 70–99)
Potassium: 3.3 mmol/L — ABNORMAL LOW (ref 3.5–5.1)
Sodium: 140 mmol/L (ref 135–145)
Total Bilirubin: 0.3 mg/dL (ref 0.3–1.2)
Total Protein: 6.9 g/dL (ref 6.5–8.1)

## 2019-08-30 MED ORDER — SODIUM CHLORIDE (PF) 0.9 % IJ SOLN
INTRAMUSCULAR | Status: AC
  Filled 2019-08-30: qty 50

## 2019-08-30 MED ORDER — IOHEXOL 300 MG/ML  SOLN
100.0000 mL | Freq: Once | INTRAMUSCULAR | Status: AC | PRN
  Administered 2019-08-30: 100 mL via INTRAVENOUS

## 2019-08-30 MED ORDER — HEPARIN SOD (PORK) LOCK FLUSH 100 UNIT/ML IV SOLN
INTRAVENOUS | Status: AC
  Filled 2019-08-30: qty 5

## 2019-08-30 MED FILL — Fosaprepitant Dimeglumine For IV Infusion 150 MG (Base Eq): INTRAVENOUS | Qty: 5 | Status: AC

## 2019-08-30 MED FILL — Dexamethasone Sodium Phosphate Inj 100 MG/10ML: INTRAMUSCULAR | Qty: 1 | Status: AC

## 2019-08-31 ENCOUNTER — Inpatient Hospital Stay (HOSPITAL_BASED_OUTPATIENT_CLINIC_OR_DEPARTMENT_OTHER): Payer: Medicaid Other

## 2019-08-31 ENCOUNTER — Inpatient Hospital Stay: Payer: Medicaid Other

## 2019-08-31 ENCOUNTER — Telehealth: Payer: Self-pay | Admitting: Oncology

## 2019-08-31 ENCOUNTER — Inpatient Hospital Stay (HOSPITAL_BASED_OUTPATIENT_CLINIC_OR_DEPARTMENT_OTHER): Payer: Medicaid Other | Admitting: Oncology

## 2019-08-31 ENCOUNTER — Other Ambulatory Visit: Payer: Self-pay | Admitting: *Deleted

## 2019-08-31 ENCOUNTER — Other Ambulatory Visit: Payer: Self-pay

## 2019-08-31 VITALS — BP 122/90 | HR 99 | Temp 97.8°F | Resp 20 | Wt 132.8 lb

## 2019-08-31 DIAGNOSIS — Z23 Encounter for immunization: Secondary | ICD-10-CM | POA: Diagnosis present

## 2019-08-31 DIAGNOSIS — C20 Malignant neoplasm of rectum: Secondary | ICD-10-CM

## 2019-08-31 DIAGNOSIS — C187 Malignant neoplasm of sigmoid colon: Secondary | ICD-10-CM | POA: Diagnosis not present

## 2019-08-31 MED ORDER — POTASSIUM CHLORIDE CRYS ER 10 MEQ PO TBCR: 10.0000 meq | EXTENDED_RELEASE_TABLET | Freq: Every day | ORAL | 1 refills | Status: DC

## 2019-08-31 MED ORDER — DEXAMETHASONE 4 MG PO TABS
4.0000 mg | ORAL_TABLET | Freq: Two times a day (BID) | ORAL | 1 refills | Status: DC
Start: 2019-08-31 — End: 2019-12-22

## 2019-08-31 NOTE — Progress Notes (Signed)
Patient observed for 15 minutes post vaccine without adverse effects. Escorted to scheduler to have 2nd vaccine appointment scheduled.

## 2019-08-31 NOTE — Progress Notes (Signed)
Patient reports she only takes her BP meds every other day, due to feeling fatigue with taking daily.

## 2019-08-31 NOTE — Telephone Encounter (Signed)
Scheduled appointments per 8/18 los. Patient is aware of all appointment dates and times.

## 2019-08-31 NOTE — Progress Notes (Signed)
[place] OFFICE PROGRESS NOTE   Diagnosis: Colon cancer  INTERVAL HISTORY:   Ms. Stump completed another cycle of FOLFIRI on 08/10/2019.  She reports nausea lasting 3 to 4 days beginning on the evening of day 1.  Compazine helps the nausea.  She reports her blood pressure has been better at home.  She is taking the antihypertensive medications.  Objective:  Vital signs in last 24 hours:  Blood pressure (!) 141/109, pulse 99, temperature 97.8 F (36.6 C), temperature source Tympanic, resp. rate 20, weight 132 lb 12.8 oz (60.2 kg), SpO2 100 %.    HEENT: No thrush or ulcers Lymphatics: No cervical, supraclavicular, axillary, or inguinal nodes Resp: Distant breath sounds, no respiratory distress Cardio: Regular rate and rhythm GI: No hepatosplenomegaly, nontender, no mass Vascular: No leg edema    Portacath/PICC-without erythema, mild bruising overlying the port  Lab Results:  Lab Results  Component Value Date   WBC 5.8 08/30/2019   HGB 11.4 (L) 08/30/2019   HCT 34.5 (L) 08/30/2019   MCV 95.8 08/30/2019   PLT 192 08/30/2019   NEUTROABS 1.6 (L) 08/30/2019    CMP  Lab Results  Component Value Date   NA 140 08/30/2019   K 3.3 (L) 08/30/2019   CL 104 08/30/2019   CO2 27 08/30/2019   GLUCOSE 104 (H) 08/30/2019   BUN 9 08/30/2019   CREATININE 0.84 08/30/2019   CALCIUM 10.0 08/30/2019   PROT 6.9 08/30/2019   ALBUMIN 3.6 08/30/2019   AST 14 (L) 08/30/2019   ALT 11 08/30/2019   ALKPHOS 92 08/30/2019   BILITOT 0.3 08/30/2019   GFRNONAA >60 08/30/2019   GFRAA >60 08/30/2019    Lab Results  Component Value Date   CEA1 3.26 07/27/2019    Imaging:  CT CHEST W CONTRAST  Result Date: 08/31/2019 CLINICAL DATA:  Colorectal carcinoma staging. EXAM: CT CHEST, ABDOMEN, AND PELVIS WITH CONTRAST TECHNIQUE: Multidetector CT imaging of the chest, abdomen and pelvis was performed following the standard protocol during bolus administration of intravenous  contrast. CONTRAST:  139m OMNIPAQUE IOHEXOL 300 MG/ML  SOLN COMPARISON:  CT chest 04/18/2019 and CT AP 03/29/2018 FINDINGS: CT CHEST FINDINGS Cardiovascular: The heart size appears within normal limits. No pericardial effusion. Mediastinum/Nodes: No enlarged mediastinal, hilar, or axillary lymph nodes. Thyroid gland, trachea, and esophagus demonstrate no significant findings. Lungs/Pleura: Mild paraseptal emphysema with biapical bulla. No pleural effusion, airspace consolidation, or atelectasis. No suspicious pulmonary nodule or mass identified. 2.2 mm nodule in the posterior right upper lobe is stable, image 43/6. Small sub solid nodule within the posterior right lower lobe measures 6 mm and appears new from previous exam Musculoskeletal: No chest wall mass or suspicious bone lesions identified. CT ABDOMEN PELVIS FINDINGS Hepatobiliary: There are several a day this is small low-attenuation foci within the liver are stable from previous exam. The largest is in the dome of left hepatic lobe measuring 5 mm. No suspicious liver lesions identified at this time. Gallbladder is unremarkable. No biliary dilatation. Pancreas: Unremarkable. No pancreatic ductal dilatation or surrounding inflammatory changes. Spleen: Normal in size without focal abnormality. Adrenals/Urinary Tract: Normal adrenal glands. Large stone within upper pole of the left kidney measures 9 mm, image 61/2. Three large stones are noted within the inferior pole collecting system of the right kidney. The largest extends into the renal pelvis with a maximum dimension of 2.2 cm, image 75/4. Simple appearing cyst within the medial cortex of the left kidney measures 1.3 cm. The urinary bladder is  unremarkable. Stomach/Bowel: Stomach the small bowel loops have a normal course and caliber without evidence for bowel obstruction. Postoperative changes from distal colonic resection and reanastomosis noted in the posterior pelvis. No specific findings identified to  suggest recurrent tumor along the resection margins. Vascular/Lymphatic: No aneurysm identified. No retroperitoneal or mesenteric adenopathy identified. No enlarged iliac or inguinal lymph nodes. In small perirectal lymph node is noted in the posterior right pelvis measuring 5 mm Reproductive: The uterus and adnexal structures are unremarkable. Other: No ascites or focal fluid collections identified. No signs of peritoneal disease. Musculoskeletal: There are bilateral pars defects identified at L5. 1 cm of L5 on S1 anterolisthesis noted, image 90/5. No acute or suspicious osseous findings. IMPRESSION: 1. Status post distal colonic resection and reanastomosis. No specific findings identified to suggest recurrent tumor along the resection margins. 2. No findings identified to suggest solid organ metastasis or nodal metastasis within the chest, abdomen or pelvis. 3. Bilateral nephrolithiasis. 4. Small sub solid nodule within the posterior right lower lobe measures 6 mm and appears new from previous exam. Favor postinflammatory etiology. Attention on follow-up imaging is advised. 5. 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 6. Emphysema. Emphysema (ICD10-J43.9). Electronically Signed   By: Kerby Moors M.D.   On: 08/31/2019 09:38   CT ABDOMEN PELVIS W CONTRAST  Result Date: 08/31/2019 CLINICAL DATA:  Colorectal carcinoma staging. EXAM: CT CHEST, ABDOMEN, AND PELVIS WITH CONTRAST TECHNIQUE: Multidetector CT imaging of the chest, abdomen and pelvis was performed following the standard protocol during bolus administration of intravenous contrast. CONTRAST:  122m OMNIPAQUE IOHEXOL 300 MG/ML  SOLN COMPARISON:  CT chest 04/18/2019 and CT AP 03/29/2018 FINDINGS: CT CHEST FINDINGS Cardiovascular: The heart size appears within normal limits. No pericardial effusion. Mediastinum/Nodes: No enlarged mediastinal, hilar, or axillary lymph nodes. Thyroid gland, trachea, and esophagus demonstrate no  significant findings. Lungs/Pleura: Mild paraseptal emphysema with biapical bulla. No pleural effusion, airspace consolidation, or atelectasis. No suspicious pulmonary nodule or mass identified. 2.2 mm nodule in the posterior right upper lobe is stable, image 43/6. Small sub solid nodule within the posterior right lower lobe measures 6 mm and appears new from previous exam Musculoskeletal: No chest wall mass or suspicious bone lesions identified. CT ABDOMEN PELVIS FINDINGS Hepatobiliary: There are several a day this is small low-attenuation foci within the liver are stable from previous exam. The largest is in the dome of left hepatic lobe measuring 5 mm. No suspicious liver lesions identified at this time. Gallbladder is unremarkable. No biliary dilatation. Pancreas: Unremarkable. No pancreatic ductal dilatation or surrounding inflammatory changes. Spleen: Normal in size without focal abnormality. Adrenals/Urinary Tract: Normal adrenal glands. Large stone within upper pole of the left kidney measures 9 mm, image 61/2. Three large stones are noted within the inferior pole collecting system of the right kidney. The largest extends into the renal pelvis with a maximum dimension of 2.2 cm, image 75/4. Simple appearing cyst within the medial cortex of the left kidney measures 1.3 cm. The urinary bladder is unremarkable. Stomach/Bowel: Stomach the small bowel loops have a normal course and caliber without evidence for bowel obstruction. Postoperative changes from distal colonic resection and reanastomosis noted in the posterior pelvis. No specific findings identified to suggest recurrent tumor along the resection margins. Vascular/Lymphatic: No aneurysm identified. No retroperitoneal or mesenteric adenopathy identified. No enlarged iliac or inguinal lymph nodes. In small perirectal lymph node is noted in the posterior right pelvis measuring 5 mm Reproductive: The uterus  and adnexal structures are unremarkable. Other: No  ascites or focal fluid collections identified. No signs of peritoneal disease. Musculoskeletal: There are bilateral pars defects identified at L5. 1 cm of L5 on S1 anterolisthesis noted, image 90/5. No acute or suspicious osseous findings. IMPRESSION: 1. Status post distal colonic resection and reanastomosis. No specific findings identified to suggest recurrent tumor along the resection margins. 2. No findings identified to suggest solid organ metastasis or nodal metastasis within the chest, abdomen or pelvis. 3. Bilateral nephrolithiasis. 4. Small sub solid nodule within the posterior right lower lobe measures 6 mm and appears new from previous exam. Favor postinflammatory etiology. Attention on follow-up imaging is advised. 5. 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 6. Emphysema. Emphysema (ICD10-J43.9). Electronically Signed   By: Kerby Moors M.D.   On: 08/31/2019 09:38    Medications: I have reviewed the patient's current medications.   Assessment/Plan: 1. Sigmoid colon cancer, stage IV (pT4a,pN2b,M1c)  Colonoscopy 03/24/2019-3 rectal polyps-hyperplastic polyps, distal colon biopsy-at least intramucosal adenocarcinoma, completely obstructing mass in the distal sigmoid colon, could not be traversed, intact mismatch repair protein expression  03/24/2019-CEA 56.1  03/29/2019 CT abdomen/pelvis-circumferential thickening involving the entire mid and distal sigmoid colon to the level of the rectosigmoid junction, solid/cystic mass in the left ovary, bilateral nephrolithiasis  Robotic assisted low anterior resection, mesenteric lymphadenectomy, bilateral salpingo-oophorectomy 04/08/2019  Pathology ([facility] review of outside pathology) metastatic adenocarcinoma involving the peritoneum overlying the round ligament, serosa of the urinary bladder, serosa of the right ureter a sacral area, pelvic peritoneum, and left ovary.  Omental biopsy with focal mucin pools with no carcinoma  cells identified, right hemidiaphragm biopsy involved by metastatic adenocarcinoma, invasive adenocarcinoma the sigmoid colon, moderately differentiated, T4a, perineural and vascular invasion present, 7/22 lymph nodes, multiple tumor deposits, resection margins negative  Negative for PD-L1, low probability of MSI-high, HER-2 negative, negative for BRAF, NRAS and KRAS alterations  CTs 05/19/2019-no evidence of metastatic disease, findings suspicious for colitis of the transverse and ascending colon, small amount of ascites in the cul-de-sac, bilateral renal calculi  Cycle 1 FOLFIRI 05/24/2019, bevacizumab added with cycle 2  Cycle 4 FOLFIRI/bevacizumab 07/05/2019  Cycle 5 FOLFIRI 07/27/2019, bevacizumab held secondary to hypertension  Cycle 6 FOLFIRI 08/10/1999, bevacizumab held secondary to hypertension  CTs 08/30/2019-no evidence of recurrent disease, new subsolid right lower lobe nodule felt to be inflammatory, emphysema  2. 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 Kidney stones     Disposition: April [name] appears stable.  She has completed 6 cycles of FOLFIRI.  Avastin has been on hold for the past 2 cycles of chemotherapy secondary to severe hypertension.  The blood pressure remains borderline elevated.  Avastin will remain on hold.  The potassium was low yesterday.  She will begin a potassium supplement.  The restaging CT showed no evidence of disease progression.  I discussed treatment options with Ms. Collar.  We discussed continuing FOLFIRI, a treatment break, and switching to a maintenance regimen.  I favor continuing FOLFIRI.  She is in agreement.  We will consider adding Avastin back to the treatment regimen if the blood pressure remains improved.  She agrees to the COVID-19 vaccine.  She will receive the first vaccine today.  She would like to schedule the next cycle of FOLFIRI for 09/07/2019.  She will return for an office visit and chemotherapy on 09/21/2019.  Home Decadron  prophylaxis will be added beginning with the current cycle of chemotherapy.  I reviewed the  CT images.    Betsy Coder, MD  08/31/2019  11:46 AM

## 2019-09-07 ENCOUNTER — Other Ambulatory Visit: Payer: Medicaid Other

## 2019-09-07 MED FILL — Fosaprepitant Dimeglumine For IV Infusion 150 MG (Base Eq): INTRAVENOUS | Qty: 5 | Status: AC

## 2019-09-07 MED FILL — Dexamethasone Sodium Phosphate Inj 100 MG/10ML: INTRAMUSCULAR | Qty: 1 | Status: AC

## 2019-09-08 ENCOUNTER — Telehealth: Payer: Self-pay | Admitting: Oncology

## 2019-09-08 ENCOUNTER — Inpatient Hospital Stay: Payer: Medicaid Other

## 2019-09-08 ENCOUNTER — Other Ambulatory Visit: Payer: Self-pay | Admitting: *Deleted

## 2019-09-08 MED ORDER — LIDOCAINE-PRILOCAINE 2.5-2.5 % EX CREA: TOPICAL_CREAM | CUTANEOUS | 2 refills | Status: DC

## 2019-09-08 NOTE — Progress Notes (Signed)
Patient called infusion to cancel her appointment for treatment today due to losing her EMLA cream. New script escribed at 0850. Patient notified she can go to pharmacy to pick up cream/apply and then make her appointments today. She still declines to come in and wishes to reschedule. Scheduling message sent.

## 2019-09-08 NOTE — Telephone Encounter (Signed)
Scheduled appt per 8/26 sch msg - pt is aware of new appt on 8/30

## 2019-09-09 ENCOUNTER — Ambulatory Visit: Payer: Medicaid Other | Admitting: Family

## 2019-09-09 ENCOUNTER — Encounter: Payer: Self-pay | Admitting: Family

## 2019-09-09 ENCOUNTER — Other Ambulatory Visit: Payer: Self-pay

## 2019-09-09 VITALS — BP 124/84 | HR 102 | Temp 98.9°F | Ht 67.0 in | Wt 136.0 lb

## 2019-09-09 DIAGNOSIS — C19 Malignant neoplasm of rectosigmoid junction: Secondary | ICD-10-CM | POA: Diagnosis not present

## 2019-09-09 DIAGNOSIS — I10 Essential (primary) hypertension: Secondary | ICD-10-CM | POA: Diagnosis not present

## 2019-09-09 MED ORDER — AMLODIPINE BESYLATE 10 MG PO TABS: 10.0000 mg | ORAL_TABLET | Freq: Every day | ORAL | 1 refills | Status: DC

## 2019-09-09 NOTE — Progress Notes (Signed)
April [name] is a 44 y.o. female with the following history as recorded in [employer]:  Patient Active Problem List   Diagnosis Date Noted  . Goals of care, counseling/discussion 07/21/2019  . Malignant neoplasm of rectosigmoid (colon) ([place]) 04/08/2019  . Sepsis secondary to UTI ([place]) 01/07/2018  . Hypertension 01/07/2018  . Sinus tachycardia 01/07/2018  . Hypokalemia 01/07/2018  . Abnormal EKG 01/07/2018  . Hyperglycemia 01/07/2018  . Tobacco use 01/07/2018    Current Outpatient Medications  Medication Sig Dispense Refill  . acetaminophen (TYLENOL) 325 MG tablet Take 650 mg by mouth every 6 (six) hours as needed for moderate pain or headache.    Marland Kitchen amLODipine (NORVASC) 10 MG tablet Take 1 tablet (10 mg total) by mouth daily. 90 tablet 1  . dexamethasone (DECADRON) 4 MG tablet Take 1 tablet (4 mg total) by mouth 2 (two) times daily with a meal. Start on day 2 of treatment and take for 3 days with each cycle. 12 tablet 1  . hydrALAZINE (APRESOLINE) 10 MG tablet Take 1 tablet (10 mg total) by mouth 3 (three) times daily. 90 tablet 1  . hydrochlorothiazide (HYDRODIURIL) 25 MG tablet Take 25 mg by mouth daily.    Marland Kitchen lidocaine-prilocaine (EMLA) cream Apply topically as directed. 30 g 2  . LINZESS 290 MCG CAPS capsule Take 290 mcg by mouth daily as needed.    . metoprolol tartrate (LOPRESSOR) 25 MG tablet Take 1 tablet (25 mg total) by mouth 2 (two) times daily. 60 tablet 2  . PARoxetine (PAXIL) 10 MG tablet Take 10 mg by mouth daily.    . potassium chloride (KLOR-CON) 10 MEQ tablet Take 1 tablet (10 mEq total) by mouth daily. 30 tablet 1  . famotidine (PEPCID) 40 MG tablet Take 40 mg by mouth daily as needed. (Patient not taking: Reported on 08/10/2019)    . ibuprofen (ADVIL,MOTRIN) 800 MG tablet Take 800 mg by mouth daily as needed for moderate pain. (Patient not taking: Reported on 08/31/2019)    . ondansetron (ZOFRAN) 4 MG tablet 1 tablet every 8 (eight) hours as needed (Patient not  taking: Reported on 08/10/2019)    . ondansetron (ZOFRAN-ODT) 4 MG disintegrating tablet Take 4 mg by mouth every 8 (eight) hours as needed. (Patient not taking: Reported on 08/10/2019)    . oxyCODONE-acetaminophen (PERCOCET) 5-325 MG tablet Take 1 tablet by mouth every 6 (six) hours as needed. (Patient not taking: Reported on 08/31/2019) 10 tablet 0  . prochlorperazine (COMPAZINE) 10 MG tablet Take 10 mg by mouth every 6 (six) hours as needed. (Patient not taking: Reported on 08/31/2019)     No current facility-administered medications for this visit.    Allergies: Lisinopril  Past Medical History:  Diagnosis Date  . Hypertension   . Renal calculi    bilateral    Past Surgical History:  Procedure Laterality Date  . CESAREAN SECTION     x2  . URETERAL STENT PLACEMENT      Family History  Problem Relation Age of Onset  . Hypertension Mother   . Diabetes Mellitus II Father   . Stroke Father   . Ulcers Sister     Social History   Tobacco Use  . Smoking status: Current Every Day Smoker    Packs/day: 0.50  . Smokeless tobacco: Never Used  . Tobacco comment: In the process of quitting/ has been able to cut back  Substance Use Topics  . Alcohol use: Yes    Subjective:  Patient presents today as  a new patient; has Stage 4 rectal cancer; unfortunately she has metastatic disease- chemo is not able to be curative; was having problems with her blood pressure due to side effects of one of her chemo drugs; as her regimen has changed, her blood pressure has normalized;   History of hypertension- currently on Amlodipine 10 mg qam , Hydralazine 10 mg bid, HCTZ 25 mg qam,  Metoprolol 25 qam;   Notes that mentally she feels like she is in a good place; her family is a good support system and loves being close to her young grandchildren;  Knows she needs to quit smoking and is actively trying to cut back;     Objective:  Vitals:   09/09/19 1047  BP: 124/84  Pulse: (!) 102  Temp: 98.9 F  (37.2 C)  SpO2: 99%  Weight: 136 lb (61.7 kg)  Height: 5\' 7"  (1.702 m)    General: Well developed, well nourished, in no acute distress  Skin : Warm and dry.  Head: Normocephalic and atraumatic  Lungs: Respirations unlabored; clear to auscultation bilaterally without wheeze, rales, rhonchi  CVS exam: normal rate and regular rhythm.  Neurologic: Alert and oriented; speech intact; face symmetrical; moves all extremities well; CNII-XII intact without focal deficit   Assessment:  1. Essential hypertension   2. Malignant neoplasm of rectosigmoid (colon) ([place])     Plan:  1. Stable; continue same medications; follow-up in 6 months, sooner if her blood pressure becomes uncontrolled with chemo changes; 2. Continue with oncology as already scheduled; stressed need to quit smoking and she agrees.   Return in about 6 months (around 03/11/2020).  No orders of the defined types were placed in this encounter.   Requested Prescriptions   Signed Prescriptions Disp Refills  . amLODipine (NORVASC) 10 MG tablet 90 tablet 1    Sig: Take 1 tablet (10 mg total) by mouth daily.

## 2019-09-10 NOTE — Progress Notes (Signed)
April [name] presented to [facility] on a Saturday concerning her PAC incision, which was "tender to the touch". Upon assessment, the incision line has separated, approximately a pea size, in the middle of the incision site. The site was cleaned by this RN and dressed with a clean dry gauze. She was sent home with gauze and tape and instructed to keep site clean and dry. She is scheduled for an MD visit on Monday 09/13/19. Pt. was well appearing without complain of fever.

## 2019-09-12 ENCOUNTER — Inpatient Hospital Stay: Payer: Medicaid Other

## 2019-09-12 ENCOUNTER — Other Ambulatory Visit: Payer: Self-pay

## 2019-09-12 ENCOUNTER — Other Ambulatory Visit: Payer: Self-pay | Admitting: Radiology

## 2019-09-12 ENCOUNTER — Telehealth: Payer: Self-pay

## 2019-09-12 ENCOUNTER — Inpatient Hospital Stay (HOSPITAL_BASED_OUTPATIENT_CLINIC_OR_DEPARTMENT_OTHER): Payer: Medicaid Other | Admitting: Oncology

## 2019-09-12 ENCOUNTER — Other Ambulatory Visit (HOSPITAL_COMMUNITY): Payer: Self-pay | Admitting: Oncology

## 2019-09-12 VITALS — BP 141/103 | HR 76 | Temp 98.7°F | Resp 17

## 2019-09-12 DIAGNOSIS — Z95828 Presence of other vascular implants and grafts: Secondary | ICD-10-CM

## 2019-09-12 DIAGNOSIS — C20 Malignant neoplasm of rectum: Secondary | ICD-10-CM

## 2019-09-12 DIAGNOSIS — C187 Malignant neoplasm of sigmoid colon: Secondary | ICD-10-CM | POA: Diagnosis not present

## 2019-09-12 LAB — CBC WITH DIFFERENTIAL (CANCER CENTER ONLY)
Abs Immature Granulocytes: 0.01 10*3/uL (ref 0.00–0.07)
Basophils Absolute: 0.1 10*3/uL (ref 0.0–0.1)
Basophils Relative: 1 %
Eosinophils Absolute: 0.8 10*3/uL — ABNORMAL HIGH (ref 0.0–0.5)
Eosinophils Relative: 12 %
HCT: 38.8 % (ref 36.0–46.0)
Hemoglobin: 13.1 g/dL (ref 12.0–15.0)
Immature Granulocytes: 0 %
Lymphocytes Relative: 40 %
Lymphs Abs: 2.7 10*3/uL (ref 0.7–4.0)
MCH: 32.2 pg (ref 26.0–34.0)
MCHC: 33.8 g/dL (ref 30.0–36.0)
MCV: 95.3 fL (ref 80.0–100.0)
Monocytes Absolute: 0.6 10*3/uL (ref 0.1–1.0)
Monocytes Relative: 9 %
Neutro Abs: 2.6 10*3/uL (ref 1.7–7.7)
Neutrophils Relative %: 38 %
Platelet Count: 251 10*3/uL (ref 150–400)
RBC: 4.07 MIL/uL (ref 3.87–5.11)
RDW: 14.3 % (ref 11.5–15.5)
WBC Count: 6.7 10*3/uL (ref 4.0–10.5)
nRBC: 0 % (ref 0.0–0.2)

## 2019-09-12 LAB — CMP (CANCER CENTER ONLY)
ALT: 8 U/L (ref 0–44)
AST: 13 U/L — ABNORMAL LOW (ref 15–41)
Albumin: 4 g/dL (ref 3.5–5.0)
Alkaline Phosphatase: 108 U/L (ref 38–126)
Anion gap: 10 (ref 5–15)
BUN: 10 mg/dL (ref 6–20)
CO2: 30 mmol/L (ref 22–32)
Calcium: 10.7 mg/dL — ABNORMAL HIGH (ref 8.9–10.3)
Chloride: 99 mmol/L (ref 98–111)
Creatinine: 0.77 mg/dL (ref 0.44–1.00)
GFR, Est AFR Am: >60 mL/min (ref >60)
GFR, Estimated: >60 mL/min (ref >60)
Glucose, Bld: 88 mg/dL (ref 70–99)
Potassium: 3.5 mmol/L (ref 3.5–5.1)
Sodium: 139 mmol/L (ref 135–145)
Total Bilirubin: 0.3 mg/dL (ref 0.3–1.2)
Total Protein: 7.6 g/dL (ref 6.5–8.1)

## 2019-09-12 LAB — MAGNESIUM: Magnesium: 1.8 mg/dL (ref 1.7–2.4)

## 2019-09-12 MED ORDER — AMOXICILLIN-POT CLAVULANATE 500-125 MG PO TABS: 1.0000 | ORAL_TABLET | Freq: Two times a day (BID) | ORAL | 0 refills | Status: DC

## 2019-09-12 MED ORDER — SODIUM CHLORIDE 0.9% FLUSH
10.0000 mL | INTRAVENOUS | Status: DC | PRN
  Administered 2019-09-12: 10 mL via INTRAVENOUS
  Filled 2019-09-12: qty 10

## 2019-09-12 NOTE — Progress Notes (Signed)
Pt does not want to use port for labs due to possible infection. Pt seen Saturday for hole at incision site. Patient sent back to lab to get blood drawn from arm as requested by patient

## 2019-09-12 NOTE — Progress Notes (Signed)
[place] OFFICE PROGRESS NOTE   Diagnosis: Colon cancer  INTERVAL HISTORY:   April [name] returns for scheduled FOLFIRI chemotherapy. She feels well. She has developed tenderness at the Port-A-Cath site. The port incision now has an opening with drainage. No fever.  Objective:  Vital signs in last 24 hours:  There were no vitals taken for this visit.     Portacath/PICC-mild tenderness surrounding the Port-A-Cath. No erythema. 3-4 mm opening at the Port-A-Cath incision with purulent drainage. No fluctuance.  Lab Results:  Lab Results  Component Value Date   WBC 6.7 09/12/2019   HGB 13.1 09/12/2019   HCT 38.8 09/12/2019   MCV 95.3 09/12/2019   PLT 251 09/12/2019   NEUTROABS 2.6 09/12/2019    CMP  Lab Results  Component Value Date   NA 139 09/12/2019   K 3.5 09/12/2019   CL 99 09/12/2019   CO2 30 09/12/2019   GLUCOSE 88 09/12/2019   BUN 10 09/12/2019   CREATININE 0.77 09/12/2019   CALCIUM 10.7 (H) 09/12/2019   PROT 7.6 09/12/2019   ALBUMIN 4.0 09/12/2019   AST 13 (L) 09/12/2019   ALT 8 09/12/2019   ALKPHOS 108 09/12/2019   BILITOT 0.3 09/12/2019   GFRNONAA >60 09/12/2019   GFRAA >60 09/12/2019    Lab Results  Component Value Date   CEA1 3.26 07/27/2019    Lab Results  Component Value Date   INR 1.20 01/07/2018    Imaging:  No results found.  Medications: I have reviewed the patient's current medications.   Assessment/Plan: 1. Sigmoid colon cancer, stage IV (pT4a,pN2b,M1c)  Colonoscopy 03/24/2019-3 rectal polyps-hyperplastic polyps, distal colon biopsy-at least intramucosal adenocarcinoma, completely obstructing mass in the distal sigmoid colon, could not be traversed, intact mismatch repair protein expression  03/24/2019-CEA 56.1  03/29/2019 CT abdomen/pelvis-circumferential thickening involving the entire mid and distal sigmoid colon to the level of the rectosigmoid junction, solid/cystic mass in the left ovary, bilateral  nephrolithiasis  Robotic assisted low anterior resection, mesenteric lymphadenectomy, bilateral salpingo-oophorectomy 04/08/2019  Pathology ([facility] review of outside pathology) metastatic adenocarcinoma involving the peritoneum overlying the round ligament, serosa of the urinary bladder, serosa of the right ureter a sacral area, pelvic peritoneum, and left ovary.  Omental biopsy with focal mucin pools with no carcinoma cells identified, right hemidiaphragm biopsy involved by metastatic adenocarcinoma, invasive adenocarcinoma the sigmoid colon, moderately differentiated, T4a, perineural and vascular invasion present, 7/22 lymph nodes, multiple tumor deposits, resection margins negative  Negative for PD-L1, low probability of MSI-high, HER-2 negative, negative for BRAF, NRAS and KRAS alterations  CTs 05/19/2019-no evidence of metastatic disease, findings suspicious for colitis of the transverse and ascending colon, small amount of ascites in the cul-de-sac, bilateral renal calculi  Cycle 1 FOLFIRI 05/24/2019, bevacizumab added with cycle 2  Cycle 4 FOLFIRI/bevacizumab 07/05/2019  Cycle 5 FOLFIRI 07/27/2019, bevacizumab held secondary to hypertension  Cycle 6 FOLFIRI 08/10/1999, bevacizumab held secondary to hypertension  CTs 08/30/2019-no evidence of recurrent disease, new subsolid right lower lobe nodule felt to be inflammatory, emphysema  2. 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 Kidney stones 5. Infected Port-A-Cath 09/12/2019-placed on Augmentin, referred for Port-A-Cath removal       Disposition: April [name] is here today for a scheduled cycle of FOLFIRI. The Port-A-Cath incision has opened with a purulent drainage. Chemotherapy will plate placed on hold. I contacted interventional radiology. They will remove the Port-A-Cath within the next 1-2 days. We cultured the Port-A-Cath drainage and she will begin Augmentin. She knows to call  for a fever or new symptoms.  April [name] will be referred  for Port-A-Cath removal and PICC placement. She will be scheduled for the next cycle of chemotherapy in 1-2 weeks.     Betsy Coder, MD  09/12/2019  3:43 PM

## 2019-09-12 NOTE — Progress Notes (Signed)
No treatment today per Dr. Sherrill.   

## 2019-09-12 NOTE — Telephone Encounter (Signed)
Spoke with pt about getting PICC placed for her next treatment on 09/26/19 since she will no longer have her Port-a-cath. Pt verbalizes understanding and agrees with plan of care.

## 2019-09-12 NOTE — Progress Notes (Signed)
Wound culture taken to lab from the swab from Right port a cath taken by MD Sherrill.

## 2019-09-13 ENCOUNTER — Telehealth: Payer: Self-pay | Admitting: Oncology

## 2019-09-13 NOTE — Telephone Encounter (Signed)
No new appointments scheduled per 8/30 los.

## 2019-09-14 ENCOUNTER — Encounter (HOSPITAL_COMMUNITY): Payer: Self-pay

## 2019-09-14 ENCOUNTER — Other Ambulatory Visit: Payer: Self-pay | Admitting: Oncology

## 2019-09-14 ENCOUNTER — Ambulatory Visit (HOSPITAL_COMMUNITY)
Admission: RE | Admit: 2019-09-14 | Discharge: 2019-09-14 | Disposition: A | Discharge status: Home / Self Care | Payer: Medicaid Other | Source: Ambulatory Visit | Attending: Oncology | Admitting: Oncology

## 2019-09-14 ENCOUNTER — Other Ambulatory Visit: Payer: Self-pay

## 2019-09-14 DIAGNOSIS — Z79899 Other long term (current) drug therapy: Secondary | ICD-10-CM | POA: Insufficient documentation

## 2019-09-14 DIAGNOSIS — C20 Malignant neoplasm of rectum: Secondary | ICD-10-CM

## 2019-09-14 DIAGNOSIS — I10 Essential (primary) hypertension: Secondary | ICD-10-CM | POA: Diagnosis not present

## 2019-09-14 DIAGNOSIS — F17210 Nicotine dependence, cigarettes, uncomplicated: Secondary | ICD-10-CM | POA: Diagnosis not present

## 2019-09-14 DIAGNOSIS — Z95828 Presence of other vascular implants and grafts: Secondary | ICD-10-CM

## 2019-09-14 HISTORY — PX: IR REMOVAL TUN ACCESS W/ PORT W/O FL MOD SED: IMG2290

## 2019-09-14 LAB — CBC WITH DIFFERENTIAL/PLATELET
Abs Immature Granulocytes: 0.01 10*3/uL (ref 0.00–0.07)
Basophils Absolute: 0.1 10*3/uL (ref 0.0–0.1)
Basophils Relative: 1 %
Eosinophils Absolute: 1.1 10*3/uL — ABNORMAL HIGH (ref 0.0–0.5)
Eosinophils Relative: 16 %
HCT: 41.4 % (ref 36.0–46.0)
Hemoglobin: 13.7 g/dL (ref 12.0–15.0)
Immature Granulocytes: 0 %
Lymphocytes Relative: 40 %
Lymphs Abs: 2.9 10*3/uL (ref 0.7–4.0)
MCH: 32.5 pg (ref 26.0–34.0)
MCHC: 33.1 g/dL (ref 30.0–36.0)
MCV: 98.1 fL (ref 80.0–100.0)
Monocytes Absolute: 0.5 10*3/uL (ref 0.1–1.0)
Monocytes Relative: 7 %
Neutro Abs: 2.6 10*3/uL (ref 1.7–7.7)
Neutrophils Relative %: 36 %
Platelets: 255 10*3/uL (ref 150–400)
RBC: 4.22 MIL/uL (ref 3.87–5.11)
RDW: 14.9 % (ref 11.5–15.5)
WBC: 7.2 10*3/uL (ref 4.0–10.5)
nRBC: 0 % (ref 0.0–0.2)

## 2019-09-14 LAB — PROTIME-INR
INR: 0.9 (ref 0.8–1.2)
Prothrombin Time: 11.6 seconds (ref 11.4–15.2)

## 2019-09-14 MED ORDER — MIDAZOLAM HCL 2 MG/2ML IJ SOLN
INTRAMUSCULAR | Status: AC
  Filled 2019-09-14: qty 2

## 2019-09-14 MED ORDER — LIDOCAINE-EPINEPHRINE 1 %-1:100000 IJ SOLN
INTRAMUSCULAR | Status: AC
  Filled 2019-09-14: qty 1

## 2019-09-14 MED ORDER — LIDOCAINE-EPINEPHRINE 1 %-1:100000 IJ SOLN
INTRAMUSCULAR | Status: AC | PRN
  Administered 2019-09-14: 5 mL via INTRADERMAL

## 2019-09-14 MED ORDER — FENTANYL CITRATE (PF) 100 MCG/2ML IJ SOLN
INTRAMUSCULAR | Status: AC | PRN
  Administered 2019-09-14 (×3): 50 ug via INTRAVENOUS

## 2019-09-14 MED ORDER — FENTANYL CITRATE (PF) 100 MCG/2ML IJ SOLN
INTRAMUSCULAR | Status: AC
  Filled 2019-09-14: qty 2

## 2019-09-14 MED ORDER — SODIUM CHLORIDE 0.9 % IV SOLN: INTRAVENOUS | Status: DC

## 2019-09-14 MED ORDER — CEFAZOLIN SODIUM-DEXTROSE 2-4 GM/100ML-% IV SOLN: 2.0000 g | INTRAVENOUS | Status: AC

## 2019-09-14 MED ORDER — HEPARIN SOD (PORK) LOCK FLUSH 100 UNIT/ML IV SOLN
INTRAVENOUS | Status: AC
  Filled 2019-09-14: qty 5

## 2019-09-14 MED ORDER — MIDAZOLAM HCL 2 MG/2ML IJ SOLN
INTRAMUSCULAR | Status: AC | PRN
  Administered 2019-09-14 (×4): 1 mg via INTRAVENOUS

## 2019-09-14 MED ORDER — CEFAZOLIN SODIUM-DEXTROSE 2-4 GM/100ML-% IV SOLN
INTRAVENOUS | Status: AC
  Administered 2019-09-14: 2 g via INTRAVENOUS
  Filled 2019-09-14: qty 100

## 2019-09-14 NOTE — Discharge Instructions (Signed)
Please call Interventional Radiology clinic [phone] with any questions or concerns.  Please call Dr. Gearldine Shown office tomorrow 09-15-19 to follow up with PICC line care.  Please follow up with Interventional Radiology on 09-16-19.  Please arrive no later than 10:00 for your scheduled appointment at 10:30.    Implanted Port Removal, Care After This sheet gives you information about how to care for yourself after your procedure. Your health care provider may also give you more specific instructions. If you have problems or questions, contact your health care provider. What can I expect after the procedure? After the procedure, it is common to have:  Soreness or pain near your incision.  Some swelling or bruising near your incision. Follow these instructions at home: Medicines  Take over-the-counter and prescription medicines only as told by your health care provider.  If you were prescribed an antibiotic medicine, take it as told by your health care provider. Do not stop taking the antibiotic even if you start to feel better. Bathing  Do not take baths, swim, or use a hot tub until your health care provider approves. Ask your health care provider if you can take showers. You may only be allowed to take sponge baths. Incision care   Follow instructions from your health care provider about how to take care of your incision. Make sure you: ? Wash your hands with soap and water before you change your bandage (dressing). If soap and water are not available, use hand sanitizer. ? Change your dressing as told by your health care provider. Do not change your dressing.  It will be changed on Friday at Interventional Radiology appointment. ? Keep your dressing dry. ? Leave stitches (sutures), skin glue, or adhesive strips in place. These skin closures may need to stay in place for 2 weeks or longer. If adhesive strip edges start to loosen and curl up, you may trim the loose edges. Do not remove  adhesive strips completely unless your health care provider tells you to do that.  Check your incision area every day for signs of infection. Check for: ? More redness, swelling, or pain. ? More fluid or blood. ? Warmth. ? Pus or a bad smell. Driving   Do not drive for 24 hours if you were given a medicine to help you relax (sedative) during your procedure.  If you did not receive a sedative, ask your health care provider when it is safe to drive. Activity  Return to your normal activities as told by your health care provider. Ask your health care provider what activities are safe for you.  Do not lift anything that is heavier than 10 lb (4.5 kg), or the limit that you are told, until your health care provider says that it is safe.  Do not do activities that involve lifting your arms over your head. General instructions  Do not use any products that contain nicotine or tobacco, such as cigarettes and e-cigarettes. These can delay healing. If you need help quitting, ask your health care provider.  Keep all follow-up visits as told by your health care provider. This is important. Contact a health care provider if:  You have more redness, swelling, or pain around your incision.  You have more fluid or blood coming from your incision.  Your incision feels warm to the touch.  You have pus or a bad smell coming from your incision.  You have pain that is not relieved by your pain medicine. Get help right away if you  have:  A fever or chills.  Chest pain.  Difficulty breathing. Summary  After the procedure, it is common to have pain, soreness, swelling, or bruising near your incision.  If you were prescribed an antibiotic medicine, take it as told by your health care provider. Do not stop taking the antibiotic even if you start to feel better.  Do not drive for 24 hours if you were given a sedative during your procedure.  Return to your normal activities as told by your  health care provider. Ask your health care provider what activities are safe for you. This information is not intended to replace advice given to you by your health care provider. Make sure you discuss any questions you have with your health care provider. Document Revised: 02/12/2017 Document Reviewed: 02/12/2017 Elsevier Patient Education  2020 [place] Insertion, Care After  This sheet gives you information about how to care for yourself after your procedure. Your health care provider may also give you more specific instructions. If you have problems or questions, contact your health care provider. What can I expect after the procedure? After your procedure, it is common to have mild discomfort at the insertion site. Follow these instructions at home: Activity  Rest as told by your health care provider. Ask your health care provider when you can return to your normal activities.  If your PICC is near or at the bend of your elbow, avoid activity with repeated motion at the elbow.  Do not lift anything that is heavier than 10 lb (4.5 kg), or the limit that you are told, until your health care provider says that it is safe.  Avoid using a crutch with the arm on the same side as your PICC. You may need to use a walker.  Avoid any activity that requires great effort as told by your health care provider. Bathing  Do not take baths, swim, or use a hot tub until your health care provider approves. Ask your health care provider if you can take showers. You may only be allowed to take sponge baths for bathing.  Keep the bandage (dressing) dry until your health care provider says it can be removed. General instructions  Do not drive for 24 hours if you were given a medicine to help you relax (sedative).  Take over-the-counter and prescription medicines only as told by your health care provider.  Keep all follow-up visits as told by your health care provider. This is  important. Contact a health care provider if:  You have a fever or chills.  You have more redness, swelling, or pain around the insertion site.  You have fluid or blood coming from the insertion site.  Your insertion site feels warm to the touch.  You have pus or a bad smell coming from the insertion site.  Your vein feels hard and raised like a "cord" under the skin around the insertion site. Get help right away if:  You have swelling in the arm in which the PICC was inserted.  You have numbness or tingling in your fingers, hand, or arm.  You have a red streak going up your arm from where the PICC was inserted.  Your PICC cannot be flushed, is hard to flush, or leaks around the insertion site when flushed.  You have pain in your arm, ear, face, or teeth.  Your PICC is accidentally pulled all the way out. If this happens, cover the insertion site with a bandage or gauze  dressing. Do not throw the PICC away. Your health care provider will need to check it.  Your PICC was tugged or pulled and has partially come out. Do not push the PICC back in.  You hear a "flushing" sound when the PICC is flushed.  You feel your heart racing or skipping beats.  There is a hole or tear in the PICC.  You have pain in your chest.  You have shortness of breath or difficulty breathing. Summary  After your procedure, it is common to have mild discomfort at the insertion site.  Do not lift anything that is heavier than 10 lb (4.5 kg), or the limit that you are told, until your health care provider says that it is safe.  Flush the PICC as told by your health care provider. Let your health care provider know right away if the PICC is hard to flush or does not flush. Do not use force to flush the PICC.  Check your insertion site every day for signs of infection. This information is not intended to replace advice given to you by your health care provider. Make sure you discuss any questions you  have with your health care provider. Document Revised: 12/12/2016 Document Reviewed: 02/25/2016 Elsevier Patient Education  [place].   Moderate Conscious Sedation, Adult, Care After These instructions provide you with information about caring for yourself after your procedure. Your health care provider may also give you more specific instructions. Your treatment has been planned according to current medical practices, but problems sometimes occur. Call your health care provider if you have any problems or questions after your procedure. What can I expect after the procedure? After your procedure, it is common:  To feel sleepy for several hours.  To feel clumsy and have poor balance for several hours.  To have poor judgment for several hours.  To vomit if you eat too soon. Follow these instructions at home: For at least 24 hours after the procedure:   Do not: ? Participate in activities where you could fall or become injured. ? Drive. ? Use heavy machinery. ? Drink alcohol. ? Take sleeping pills or medicines that cause drowsiness. ? Make important decisions or sign legal documents. ? Take care of children on your own.  Rest. Eating and drinking  Follow the diet recommended by your health care provider.  If you vomit: ? Drink water, juice, or soup when you can drink without vomiting. ? Make sure you have little or no nausea before eating solid foods. General instructions  Have a responsible adult stay with you until you are awake and alert.  Take over-the-counter and prescription medicines only as told by your health care provider.  If you smoke, do not smoke without supervision.  Keep all follow-up visits as told by your health care provider. This is important. Contact a health care provider if:  You keep feeling nauseous or you keep vomiting.  You feel light-headed.  You develop a rash.  You have a fever. Get help right away if:  You have trouble  breathing. This information is not intended to replace advice given to you by your health care provider. Make sure you discuss any questions you have with your health care provider. Document Revised: 12/12/2016 Document Reviewed: 04/21/2015 Elsevier Patient Education  2020 [employer].

## 2019-09-14 NOTE — Procedures (Signed)
Pre Procedural Dx: Infected port-a-catheter; Poor venous access Post Procedural Dx: Same  Successful removal of anterior chest wall port-a-cath - port reservoir site packed with Iodoform guaze. Successful image guided placement of right brachial vein approach PICC with tip terminating within the superior CA junction.  EBL: Minimal No immediate post procedural complications.   PICC is ready for immediate use.   Ronny Bacon, MD Pager #: [phone]

## 2019-09-14 NOTE — Progress Notes (Signed)
Dr. Pascal Lux notified blood pressure 130's/100's.  No orders given

## 2019-09-14 NOTE — Consult Note (Addendum)
Chief Complaint: Patient was seen in consultation today for port a cath removal and PICC placement  Referring Physician(s): Sherrill,Gary B  Supervising Physician: Sandi Mariscal  Patient Status: [facility] - Out-pt  History of Present Illness: April [name] is a 44 y.o. female smoker with PMH stage IV colon cancer diagnosed in March of this year, s/p surgery and ongoing chemotherapy. She had rt chest wall port a cath placed in [place] around April of this year and now presents with some wound breakdown and recent drainage from site. She is on Augmentin. She presents today for port removal and PICC placement.   Past Medical History:  Diagnosis Date  . Hypertension   . Renal calculi    bilateral    Past Surgical History:  Procedure Laterality Date  . CESAREAN SECTION     x2  . URETERAL STENT PLACEMENT      Allergies: Lisinopril  Medications: Prior to Admission medications   Medication Sig Start Date End Date Taking? Authorizing Provider  amLODipine (NORVASC) 10 MG tablet Take 1 tablet (10 mg total) by mouth daily. 09/09/19  Yes Marrian Salvage, FNP  amoxicillin-clavulanate (AUGMENTIN) 500-125 MG tablet Take 1 tablet (500 mg total) by mouth 2 (two) times daily. 09/12/19  Yes Ladell Pier, MD  hydrALAZINE (APRESOLINE) 10 MG tablet Take 1 tablet (10 mg total) by mouth 3 (three) times daily. 07/21/19  Yes Ladell Pier, MD  hydrochlorothiazide (HYDRODIURIL) 25 MG tablet Take 25 mg by mouth daily. 07/12/19  Yes [provider]  LINZESS 290 MCG CAPS capsule Take 290 mcg by mouth daily as needed. 09/07/19  Yes [provider]  metoprolol tartrate (LOPRESSOR) 25 MG tablet Take 1 tablet (25 mg total) by mouth 2 (two) times daily. 08/10/19  Yes Ladell Pier, MD  oxyCODONE-acetaminophen (PERCOCET) 5-325 MG tablet Take 1 tablet by mouth every 6 (six) hours as needed. 03/15/18  Yes Drenda Freeze, MD  potassium chloride (KLOR-CON) 10 MEQ tablet Take 1  tablet (10 mEq total) by mouth daily. 08/31/19  Yes Ladell Pier, MD  acetaminophen (TYLENOL) 325 MG tablet Take 650 mg by mouth every 6 (six) hours as needed for moderate pain or headache.    [provider]  dexamethasone (DECADRON) 4 MG tablet Take 1 tablet (4 mg total) by mouth 2 (two) times daily with a meal. Start on day 2 of treatment and take for 3 days with each cycle. 08/31/19   Ladell Pier, MD  famotidine (PEPCID) 40 MG tablet Take 40 mg by mouth daily as needed. Patient not taking: Reported on 08/10/2019 06/09/19   [provider]  ibuprofen (ADVIL,MOTRIN) 800 MG tablet Take 800 mg by mouth daily as needed for moderate pain. Patient not taking: Reported on 08/31/2019    [provider]  lidocaine-prilocaine (EMLA) cream Apply topically as directed. 09/08/19   Ladell Pier, MD  ondansetron (ZOFRAN) 4 MG tablet 1 tablet every 8 (eight) hours as needed Patient not taking: Reported on 08/10/2019 05/02/19   [provider]  ondansetron (ZOFRAN-ODT) 4 MG disintegrating tablet Take 4 mg by mouth every 8 (eight) hours as needed. Patient not taking: Reported on 08/10/2019 05/27/19   [provider]  PARoxetine (PAXIL) 10 MG tablet Take 10 mg by mouth daily. 05/02/19   [provider]  prochlorperazine (COMPAZINE) 10 MG tablet Take 10 mg by mouth every 6 (six) hours as needed. Patient not taking: Reported on 08/31/2019 07/06/19   [provider]  Family History  Problem Relation Age of Onset  . Hypertension Mother   . Diabetes Mellitus II Father   . Stroke Father   . Ulcers Sister     Social History   Socioeconomic History  . Marital status: Single    Spouse name: Not on file  . Number of children: Not on file  . Years of education: Not on file  . Highest education level: Not on file  Occupational History  . Not on file  Tobacco Use  . Smoking status: Current Every Day Smoker    Packs/day: 0.50  . Smokeless  tobacco: Never Used  . Tobacco comment: In the process of quitting/ has been able to cut back  Vaping Use  . Vaping Use: Never used  Substance and Sexual Activity  . Alcohol use: Yes  . Drug use: Not Currently  . Sexual activity: Not Currently  Other Topics Concern  . Not on file  Social History Narrative  . Not on file   Social Determinants of Health   Financial Resource Strain:   . Difficulty of Paying Living Expenses: Not on file  Food Insecurity: Food Insecurity Present  . Worried About Charity fundraiser in the Last Year: Sometimes true  . Ran Out of Food in the Last Year: Sometimes true  Transportation Needs: Unmet Transportation Needs  . Lack of Transportation (Medical): Yes  . Lack of Transportation (Non-Medical): Yes  Physical Activity: Sufficiently Active  . Days of Exercise per Week: 5 days  . Minutes of Exercise per Session: 30 min  Stress: Stress Concern Present  . Feeling of Stress : Very much  Social Connections: Moderately Isolated  . Frequency of Communication with Friends and Family: Three times a week  . Frequency of Social Gatherings with Friends and Family: Three times a week  . Attends Religious Services: More than 4 times per year  . Active Member of Clubs or Organizations: No  . Attends Archivist Meetings: Never  . Marital Status: Never married      Review of Systems denies fever,HA,CP,dyspnea, cough, back pain,N/V or bleeding  Vital Signs: BP (!) 144/112   Pulse 89   Temp 98.3 F (36.8 C) (Oral)   Resp 16   SpO2 100%   Physical Exam awake/alert; chest- CTA bilat; rt chest wall port site with non approximation of incision site but no overt drainage or worsening erythema; abd- soft,+BS, mild diffuse tenderness; no LE edema  Imaging: CT CHEST W CONTRAST  Result Date: 08/31/2019 CLINICAL DATA:  Colorectal carcinoma staging. EXAM: CT CHEST, ABDOMEN, AND PELVIS WITH CONTRAST TECHNIQUE: Multidetector CT imaging of the chest, abdomen  and pelvis was performed following the standard protocol during bolus administration of intravenous contrast. CONTRAST:  154mL OMNIPAQUE IOHEXOL 300 MG/ML  SOLN COMPARISON:  CT chest 04/18/2019 and CT AP 03/29/2018 FINDINGS: CT CHEST FINDINGS Cardiovascular: The heart size appears within normal limits. No pericardial effusion. Mediastinum/Nodes: No enlarged mediastinal, hilar, or axillary lymph nodes. Thyroid gland, trachea, and esophagus demonstrate no significant findings. Lungs/Pleura: Mild paraseptal emphysema with biapical bulla. No pleural effusion, airspace consolidation, or atelectasis. No suspicious pulmonary nodule or mass identified. 2.2 mm nodule in the posterior right upper lobe is stable, image 43/6. Small sub solid nodule within the posterior right lower lobe measures 6 mm and appears new from previous exam Musculoskeletal: No chest wall mass or suspicious bone lesions identified. CT ABDOMEN PELVIS FINDINGS Hepatobiliary: There are several a day this is small low-attenuation foci within the  liver are stable from previous exam. The largest is in the dome of left hepatic lobe measuring 5 mm. No suspicious liver lesions identified at this time. Gallbladder is unremarkable. No biliary dilatation. Pancreas: Unremarkable. No pancreatic ductal dilatation or surrounding inflammatory changes. Spleen: Normal in size without focal abnormality. Adrenals/Urinary Tract: Normal adrenal glands. Large stone within upper pole of the left kidney measures 9 mm, image 61/2. Three large stones are noted within the inferior pole collecting system of the right kidney. The largest extends into the renal pelvis with a maximum dimension of 2.2 cm, image 75/4. Simple appearing cyst within the medial cortex of the left kidney measures 1.3 cm. The urinary bladder is unremarkable. Stomach/Bowel: Stomach the small bowel loops have a normal course and caliber without evidence for bowel obstruction. Postoperative changes from distal  colonic resection and reanastomosis noted in the posterior pelvis. No specific findings identified to suggest recurrent tumor along the resection margins. Vascular/Lymphatic: No aneurysm identified. No retroperitoneal or mesenteric adenopathy identified. No enlarged iliac or inguinal lymph nodes. In small perirectal lymph node is noted in the posterior right pelvis measuring 5 mm Reproductive: The uterus and adnexal structures are unremarkable. Other: No ascites or focal fluid collections identified. No signs of peritoneal disease. Musculoskeletal: There are bilateral pars defects identified at L5. 1 cm of L5 on S1 anterolisthesis noted, image 90/5. No acute or suspicious osseous findings. IMPRESSION: 1. Status post distal colonic resection and reanastomosis. No specific findings identified to suggest recurrent tumor along the resection margins. 2. No findings identified to suggest solid organ metastasis or nodal metastasis within the chest, abdomen or pelvis. 3. Bilateral nephrolithiasis. 4. Small sub solid nodule within the posterior right lower lobe measures 6 mm and appears new from previous exam. Favor postinflammatory etiology. Attention on follow-up imaging is advised. 5. 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 6. Emphysema. Emphysema (ICD10-J43.9). Electronically Signed   By: Kerby Moors M.D.   On: 08/31/2019 09:38   CT ABDOMEN PELVIS W CONTRAST  Result Date: 08/31/2019 CLINICAL DATA:  Colorectal carcinoma staging. EXAM: CT CHEST, ABDOMEN, AND PELVIS WITH CONTRAST TECHNIQUE: Multidetector CT imaging of the chest, abdomen and pelvis was performed following the standard protocol during bolus administration of intravenous contrast. CONTRAST:  110mL OMNIPAQUE IOHEXOL 300 MG/ML  SOLN COMPARISON:  CT chest 04/18/2019 and CT AP 03/29/2018 FINDINGS: CT CHEST FINDINGS Cardiovascular: The heart size appears within normal limits. No pericardial effusion. Mediastinum/Nodes: No enlarged  mediastinal, hilar, or axillary lymph nodes. Thyroid gland, trachea, and esophagus demonstrate no significant findings. Lungs/Pleura: Mild paraseptal emphysema with biapical bulla. No pleural effusion, airspace consolidation, or atelectasis. No suspicious pulmonary nodule or mass identified. 2.2 mm nodule in the posterior right upper lobe is stable, image 43/6. Small sub solid nodule within the posterior right lower lobe measures 6 mm and appears new from previous exam Musculoskeletal: No chest wall mass or suspicious bone lesions identified. CT ABDOMEN PELVIS FINDINGS Hepatobiliary: There are several a day this is small low-attenuation foci within the liver are stable from previous exam. The largest is in the dome of left hepatic lobe measuring 5 mm. No suspicious liver lesions identified at this time. Gallbladder is unremarkable. No biliary dilatation. Pancreas: Unremarkable. No pancreatic ductal dilatation or surrounding inflammatory changes. Spleen: Normal in size without focal abnormality. Adrenals/Urinary Tract: Normal adrenal glands. Large stone within upper pole of the left kidney measures 9 mm, image 61/2. Three large stones are noted within the inferior pole  collecting system of the right kidney. The largest extends into the renal pelvis with a maximum dimension of 2.2 cm, image 75/4. Simple appearing cyst within the medial cortex of the left kidney measures 1.3 cm. The urinary bladder is unremarkable. Stomach/Bowel: Stomach the small bowel loops have a normal course and caliber without evidence for bowel obstruction. Postoperative changes from distal colonic resection and reanastomosis noted in the posterior pelvis. No specific findings identified to suggest recurrent tumor along the resection margins. Vascular/Lymphatic: No aneurysm identified. No retroperitoneal or mesenteric adenopathy identified. No enlarged iliac or inguinal lymph nodes. In small perirectal lymph node is noted in the posterior right  pelvis measuring 5 mm Reproductive: The uterus and adnexal structures are unremarkable. Other: No ascites or focal fluid collections identified. No signs of peritoneal disease. Musculoskeletal: There are bilateral pars defects identified at L5. 1 cm of L5 on S1 anterolisthesis noted, image 90/5. No acute or suspicious osseous findings. IMPRESSION: 1. Status post distal colonic resection and reanastomosis. No specific findings identified to suggest recurrent tumor along the resection margins. 2. No findings identified to suggest solid organ metastasis or nodal metastasis within the chest, abdomen or pelvis. 3. Bilateral nephrolithiasis. 4. Small sub solid nodule within the posterior right lower lobe measures 6 mm and appears new from previous exam. Favor postinflammatory etiology. Attention on follow-up imaging is advised. 5. 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 6. Emphysema. Emphysema (ICD10-J43.9). Electronically Signed   By: Kerby Moors M.D.   On: 08/31/2019 09:38    Labs:  CBC: Recent Labs    08/10/19 0842 08/30/19 1409 09/12/19 1239 09/14/19 1035  WBC 6.6 5.8 6.7 7.2  HGB 12.0 11.4* 13.1 13.7  HCT 35.2* 34.5* 38.8 41.4  PLT 224 192 251 255    COAGS: Recent Labs    09/14/19 1035  INR 0.9    BMP: Recent Labs    07/27/19 1056 08/10/19 0842 08/30/19 1409 09/12/19 1239  NA 141 136 140 139  K 3.4* 4.0 3.3* 3.5  CL 102 100 104 99  CO2 28 28 27 30   GLUCOSE 88 84 104* 88  BUN 15 19 9 10   CALCIUM 9.8 9.5 10.0 10.7*  CREATININE 0.82 0.77 0.84 0.77  GFRNONAA >60 >60 >60 >60  GFRAA >60 >60 >60 >60    LIVER FUNCTION TESTS: Recent Labs    07/27/19 1056 08/10/19 0842 08/30/19 1409 09/12/19 1239  BILITOT <0.2* <0.2* 0.3 0.3  AST 13* 17 14* 13*  ALT 8 11 11 8   ALKPHOS 123 106 92 108  PROT 7.8 7.0 6.9 7.6  ALBUMIN 4.0 3.8 3.6 4.0    TUMOR MARKERS: No results for input(s): AFPTM, CEA, CA199, CHROMGRNA in the last 8760 hours.  Assessment  and Plan:  44 y.o. female smoker with PMH stage IV colon cancer diagnosed in March of this year, s/p surgery and ongoing chemotherapy. She had rt chest wall port a cath placed in [place] around April of this year and now presents with some wound breakdown and recent drainage from site. She is on Augmentin.Culture from region is pending.  She presents today for port removal and PICC placement. Risks and benefits of image guided port-a-catheter removal/PICC placement was discussed with the patient including, but not limited to bleeding, infection,  or  injury to adjacent structures.   All of the patient's questions were answered, patient is agreeable to proceed. Consent signed and in chart.     Thank you for this  interesting consult.  I greatly enjoyed meeting April [name] and look forward to participating in their care.  A copy of this report was sent to the requesting provider on this date.  Electronically Signed: D. Rowe Robert, PA-C 09/14/2019, 11:36 AM   I spent a total of 20 minutes  in face to face in clinical consultation, greater than 50% of which was counseling/coordinating care for port a cath removal and PICC placement

## 2019-09-15 ENCOUNTER — Other Ambulatory Visit (HOSPITAL_COMMUNITY): Payer: Medicaid Other

## 2019-09-15 ENCOUNTER — Telehealth: Payer: Self-pay

## 2019-09-15 LAB — AEROBIC CULTURE W GRAM STAIN (SUPERFICIAL SPECIMEN): Gram Stain: NONE SEEN

## 2019-09-15 NOTE — Telephone Encounter (Signed)
Patient called about a dressing change appt on her port she had removed earlier in week. Informed her she had an appt with IR tomorrow to see a provider for follow up and dressing change. She was ok with that. No concerns, she did change overlying gauze due to blood on gauze but packing is in place.

## 2019-09-16 ENCOUNTER — Other Ambulatory Visit: Payer: Self-pay

## 2019-09-16 ENCOUNTER — Ambulatory Visit (HOSPITAL_COMMUNITY)
Admission: RE | Admit: 2019-09-16 | Discharge: 2019-09-16 | Disposition: A | Discharge status: Home / Self Care | Payer: Medicaid Other | Source: Ambulatory Visit | Attending: Oncology | Admitting: Oncology

## 2019-09-16 ENCOUNTER — Other Ambulatory Visit (HOSPITAL_COMMUNITY): Payer: Self-pay | Admitting: Radiology

## 2019-09-16 ENCOUNTER — Telehealth: Payer: Self-pay | Admitting: *Deleted

## 2019-09-16 DIAGNOSIS — C20 Malignant neoplasm of rectum: Secondary | ICD-10-CM

## 2019-09-16 HISTORY — PX: IR RADIOLOGIST EVAL & MGMT: IMG5224

## 2019-09-16 MED ORDER — SULFAMETHOXAZOLE-TRIMETHOPRIM 800-160 MG PO TABS: 1.0000 | ORAL_TABLET | Freq: Two times a day (BID) | ORAL | 0 refills | Status: DC

## 2019-09-16 NOTE — Telephone Encounter (Signed)
MD noted port site culture shows rare staph aureus. After consultation with pharmacy needs to d/c augmentin and start Bactrim DS #1 po bid. Patient notified and will start this evening. Has return visit w/IR on 09/20/19 for dressing change.

## 2019-09-16 NOTE — Progress Notes (Signed)
Patient ID: April [name], female   DOB: Jul 23, 1975, 44 y.o.   MRN: [id number] Patient underwent removal of iodoform gauze from right chest wall port pocket site today followed by placement of new wet-to-dry gauze dressing. No reported fevers/chills.  She will return to IR department on 9/7 for new dressing change.  Previous culture results from site revealed rare staph aureus.  Dr. Gearldine Shown office notified of culture results-patient on Augmentin.

## 2019-09-17 LAB — AEROBIC CULTURE W GRAM STAIN (SUPERFICIAL SPECIMEN): Gram Stain: NONE SEEN

## 2019-09-19 ENCOUNTER — Other Ambulatory Visit: Payer: Self-pay | Admitting: Oncology

## 2019-09-20 ENCOUNTER — Ambulatory Visit (HOSPITAL_COMMUNITY)
Admission: RE | Admit: 2019-09-20 | Discharge: 2019-09-20 | Disposition: A | Discharge status: Home / Self Care | Payer: Medicaid Other | Source: Ambulatory Visit | Attending: Radiology | Admitting: Radiology

## 2019-09-20 ENCOUNTER — Other Ambulatory Visit: Payer: Self-pay

## 2019-09-20 ENCOUNTER — Other Ambulatory Visit (HOSPITAL_COMMUNITY): Payer: Self-pay | Admitting: Radiology

## 2019-09-20 DIAGNOSIS — C20 Malignant neoplasm of rectum: Secondary | ICD-10-CM | POA: Diagnosis present

## 2019-09-20 HISTORY — PX: IR RADIOLOGIST EVAL & MGMT: IMG5224

## 2019-09-21 ENCOUNTER — Ambulatory Visit: Payer: Medicaid Other

## 2019-09-21 ENCOUNTER — Ambulatory Visit: Payer: Medicaid Other | Admitting: Oncology

## 2019-09-21 ENCOUNTER — Other Ambulatory Visit: Payer: Medicaid Other

## 2019-09-23 ENCOUNTER — Other Ambulatory Visit: Payer: Self-pay

## 2019-09-23 ENCOUNTER — Inpatient Hospital Stay: Payer: Medicaid Other | Attending: Oncology

## 2019-09-23 ENCOUNTER — Ambulatory Visit (HOSPITAL_COMMUNITY)
Admission: RE | Admit: 2019-09-23 | Discharge: 2019-09-23 | Disposition: A | Discharge status: Home / Self Care | Payer: Medicaid Other | Source: Ambulatory Visit | Attending: Radiology | Admitting: Radiology

## 2019-09-23 ENCOUNTER — Other Ambulatory Visit (HOSPITAL_COMMUNITY): Payer: Self-pay | Admitting: Radiology

## 2019-09-23 DIAGNOSIS — C187 Malignant neoplasm of sigmoid colon: Secondary | ICD-10-CM | POA: Insufficient documentation

## 2019-09-23 DIAGNOSIS — Z23 Encounter for immunization: Secondary | ICD-10-CM | POA: Insufficient documentation

## 2019-09-23 DIAGNOSIS — Z5111 Encounter for antineoplastic chemotherapy: Secondary | ICD-10-CM | POA: Insufficient documentation

## 2019-09-23 DIAGNOSIS — C20 Malignant neoplasm of rectum: Secondary | ICD-10-CM

## 2019-09-23 HISTORY — PX: IR RADIOLOGIST EVAL & MGMT: IMG5224

## 2019-09-23 NOTE — Progress Notes (Signed)
Patient ID: April [name], female   DOB: 1975/05/12, 44 y.o.   MRN: [id number] Patient presented to IR department today for right chest port pocket site dressing change.  IntraSite gel was utilized for previous dressing change on 9/7.  Patient reports no fevers, chills.  On exam port pocket is clean and dry.  No obvious purulent drainage , bleeding or erythema noted.  IntraSite gel reapplied to site and gauze dressing applied to area afterwards.  Patient will return on 9/13 for new dressing change.

## 2019-09-23 NOTE — Progress Notes (Signed)
.  covidobs

## 2019-09-23 NOTE — Progress Notes (Signed)
° °  Covid-19 Vaccination Clinic  Name:  April [name]    MRN: [id number] DOB: 08-08-1975  09/23/2019  [name] was observed post Covid-19 immunization for 15 minutes. without incident. She was provided with Vaccine Information Sheet and instruction to access the V-Safe system.   [name] was instructed to call 911 with any severe reactions post vaccine:  Difficulty breathing   Swelling of face and throat   A fast heartbeat   A bad rash all over body   Dizziness and weakness   Immunizations Administered    Name Date Dose VIS Date Route   Pfizer COVID-19 Vaccine 09/23/2019 12:21 PM 0.3 mL 03/09/2018 Intramuscular   Manufacturer: [place]   Lot: [id number]   [place]: [id number]

## 2019-09-25 ENCOUNTER — Other Ambulatory Visit: Payer: Self-pay | Admitting: Oncology

## 2019-09-26 ENCOUNTER — Inpatient Hospital Stay (HOSPITAL_BASED_OUTPATIENT_CLINIC_OR_DEPARTMENT_OTHER): Payer: Medicaid Other | Admitting: Nurse Practitioner

## 2019-09-26 ENCOUNTER — Inpatient Hospital Stay: Payer: Medicaid Other

## 2019-09-26 ENCOUNTER — Other Ambulatory Visit: Payer: Self-pay | Admitting: Oncology

## 2019-09-26 ENCOUNTER — Ambulatory Visit (HOSPITAL_COMMUNITY): Admission: RE | Admit: 2019-09-26 | Payer: Medicaid Other | Source: Ambulatory Visit

## 2019-09-26 ENCOUNTER — Encounter: Payer: Self-pay | Admitting: Nurse Practitioner

## 2019-09-26 ENCOUNTER — Other Ambulatory Visit: Payer: Self-pay

## 2019-09-26 ENCOUNTER — Telehealth: Payer: Self-pay | Admitting: Nurse Practitioner

## 2019-09-26 VITALS — BP 138/72

## 2019-09-26 VITALS — BP 138/72 | HR 74 | Temp 98.4°F | Resp 20 | Ht 67.0 in | Wt 140.6 lb

## 2019-09-26 DIAGNOSIS — C20 Malignant neoplasm of rectum: Secondary | ICD-10-CM

## 2019-09-26 DIAGNOSIS — C19 Malignant neoplasm of rectosigmoid junction: Secondary | ICD-10-CM

## 2019-09-26 DIAGNOSIS — C187 Malignant neoplasm of sigmoid colon: Secondary | ICD-10-CM | POA: Diagnosis present

## 2019-09-26 DIAGNOSIS — Z5111 Encounter for antineoplastic chemotherapy: Secondary | ICD-10-CM | POA: Diagnosis present

## 2019-09-26 DIAGNOSIS — Z23 Encounter for immunization: Secondary | ICD-10-CM | POA: Diagnosis not present

## 2019-09-26 DIAGNOSIS — Z95828 Presence of other vascular implants and grafts: Secondary | ICD-10-CM

## 2019-09-26 LAB — CMP (CANCER CENTER ONLY)
ALT: 11 U/L (ref 0–44)
AST: 16 U/L (ref 15–41)
Albumin: 3.8 g/dL (ref 3.5–5.0)
Alkaline Phosphatase: 114 U/L (ref 38–126)
Anion gap: 6 (ref 5–15)
BUN: 14 mg/dL (ref 6–20)
CO2: 26 mmol/L (ref 22–32)
Calcium: 9.2 mg/dL (ref 8.9–10.3)
Chloride: 107 mmol/L (ref 98–111)
Creatinine: 0.68 mg/dL (ref 0.44–1.00)
GFR, Est AFR Am: >60 mL/min (ref >60)
GFR, Estimated: >60 mL/min (ref >60)
Glucose, Bld: 83 mg/dL (ref 70–99)
Potassium: 4.2 mmol/L (ref 3.5–5.1)
Sodium: 139 mmol/L (ref 135–145)
Total Bilirubin: <0.2 mg/dL — ABNORMAL LOW (ref 0.3–1.2)
Total Protein: 7.3 g/dL (ref 6.5–8.1)

## 2019-09-26 LAB — CBC WITH DIFFERENTIAL (CANCER CENTER ONLY)
Abs Immature Granulocytes: 0.01 10*3/uL (ref 0.00–0.07)
Basophils Absolute: 0.1 10*3/uL (ref 0.0–0.1)
Basophils Relative: 1 %
Eosinophils Absolute: 2.2 10*3/uL — ABNORMAL HIGH (ref 0.0–0.5)
Eosinophils Relative: 30 %
HCT: 37.2 % (ref 36.0–46.0)
Hemoglobin: 12.3 g/dL (ref 12.0–15.0)
Immature Granulocytes: 0 %
Lymphocytes Relative: 37 %
Lymphs Abs: 2.7 10*3/uL (ref 0.7–4.0)
MCH: 32.1 pg (ref 26.0–34.0)
MCHC: 33.1 g/dL (ref 30.0–36.0)
MCV: 97.1 fL (ref 80.0–100.0)
Monocytes Absolute: 0.8 10*3/uL (ref 0.1–1.0)
Monocytes Relative: 11 %
Neutro Abs: 1.5 10*3/uL — ABNORMAL LOW (ref 1.7–7.7)
Neutrophils Relative %: 21 %
Platelet Count: 231 10*3/uL (ref 150–400)
RBC: 3.83 MIL/uL — ABNORMAL LOW (ref 3.87–5.11)
RDW: 13.6 % (ref 11.5–15.5)
WBC Count: 7.2 10*3/uL (ref 4.0–10.5)
nRBC: 0 % (ref 0.0–0.2)

## 2019-09-26 MED ORDER — SODIUM CHLORIDE 0.9 % IV SOLN
180.0000 mg/m2 | Freq: Once | INTRAVENOUS | Status: AC
  Administered 2019-09-26: 300 mg via INTRAVENOUS
  Filled 2019-09-26: qty 15

## 2019-09-26 MED ORDER — SODIUM CHLORIDE 0.9% FLUSH
10.0000 mL | INTRAVENOUS | Status: DC | PRN
  Administered 2019-09-26: 10 mL via INTRAVENOUS
  Filled 2019-09-26: qty 10

## 2019-09-26 MED ORDER — HYDROCHLOROTHIAZIDE 25 MG PO TABS: 25.0000 mg | ORAL_TABLET | Freq: Every day | ORAL | 0 refills | Status: DC

## 2019-09-26 MED ORDER — SODIUM CHLORIDE 0.9 % IV SOLN
150.0000 mg | Freq: Once | INTRAVENOUS | Status: AC
  Administered 2019-09-26: 150 mg via INTRAVENOUS
  Filled 2019-09-26: qty 150

## 2019-09-26 MED ORDER — FLUOROURACIL CHEMO INJECTION 2.5 GM/50ML
400.0000 mg/m2 | Freq: Once | INTRAVENOUS | Status: AC
  Administered 2019-09-26: 650 mg via INTRAVENOUS
  Filled 2019-09-26: qty 13

## 2019-09-26 MED ORDER — SODIUM CHLORIDE 0.9% FLUSH
10.0000 mL | INTRAVENOUS | Status: DC | PRN
  Filled 2019-09-26: qty 10

## 2019-09-26 MED ORDER — ATROPINE SULFATE 1 MG/ML IJ SOLN
INTRAMUSCULAR | Status: AC
  Filled 2019-09-26: qty 1

## 2019-09-26 MED ORDER — SODIUM CHLORIDE 0.9 % IV SOLN
Freq: Once | INTRAVENOUS | Status: AC
  Filled 2019-09-26: qty 250

## 2019-09-26 MED ORDER — ATROPINE SULFATE 1 MG/ML IJ SOLN
0.5000 mg | Freq: Once | INTRAMUSCULAR | Status: AC | PRN
  Administered 2019-09-26: 0.5 mg via INTRAVENOUS

## 2019-09-26 MED ORDER — HEPARIN SOD (PORK) LOCK FLUSH 100 UNIT/ML IV SOLN
250.0000 [IU] | Freq: Once | INTRAVENOUS | Status: DC | PRN
  Filled 2019-09-26: qty 5

## 2019-09-26 MED ORDER — SODIUM CHLORIDE 0.9 % IV SOLN
400.0000 mg/m2 | Freq: Once | INTRAVENOUS | Status: AC
  Administered 2019-09-26: 668 mg via INTRAVENOUS
  Filled 2019-09-26: qty 33.4

## 2019-09-26 MED ORDER — PALONOSETRON HCL INJECTION 0.25 MG/5ML
0.2500 mg | Freq: Once | INTRAVENOUS | Status: AC
  Administered 2019-09-26: 0.25 mg via INTRAVENOUS

## 2019-09-26 MED ORDER — SODIUM CHLORIDE 0.9 % IV SOLN
2400.0000 mg/m2 | INTRAVENOUS | Status: DC
  Administered 2019-09-26: 4000 mg via INTRAVENOUS
  Filled 2019-09-26: qty 80

## 2019-09-26 MED ORDER — SODIUM CHLORIDE 0.9 % IV SOLN
10.0000 mg | Freq: Once | INTRAVENOUS | Status: AC
  Administered 2019-09-26: 10 mg via INTRAVENOUS
  Filled 2019-09-26: qty 10

## 2019-09-26 MED ORDER — PALONOSETRON HCL INJECTION 0.25 MG/5ML
INTRAVENOUS | Status: AC
  Filled 2019-09-26: qty 5

## 2019-09-26 NOTE — Telephone Encounter (Signed)
Scheduled appointment per 9/13 los. Sent message to Infusion nurse Shelton Silvas to print out updated calendar for patient since patient was still in treatment at center.

## 2019-09-26 NOTE — Progress Notes (Addendum)
[facility] OFFICE PROGRESS NOTE   Diagnosis: Colon cancer  INTERVAL HISTORY:   April [name] returns as scheduled.  She last had chemotherapy 08/10/2019.  Chemotherapy was held 09/12/2019 due to Port-A-Cath infection.  The Port-A-Cath was removed on 09/14/2019 and a PICC line was placed.  Culture showed Staph aureus.  She completed a course of Septra.  She continues to have pain at the Port-A-Cath site.  No nausea or vomiting.  No recent diarrhea.  She does tend to have diarrhea for about 1 day after each chemotherapy.  No mouth sores.  Objective:  Vital signs in last 24 hours:  Blood pressure 138/72, pulse 74, temperature 98.4 F (36.9 C), temperature source Oral, resp. rate 20, height '5\' 7"'  (1.702 m), weight 140 lb 9.6 oz (63.8 kg), SpO2 100 %.    HEENT: White coating over tongue.  No buccal thrush. Resp: Lungs clear bilaterally. Cardio: Regular rate and rhythm. GI: Abdomen soft and nontender.  No hepatomegaly. Vascular: No leg edema. Previous Port-A-Cath site with an approximate 1/2 to 1 cm area of opening.  Brown drainage on gauze.  Right upper extremity PICC without erythema.  Lab Results:  Lab Results  Component Value Date   WBC 7.2 09/26/2019   HGB 12.3 09/26/2019   HCT 37.2 09/26/2019   MCV 97.1 09/26/2019   PLT 231 09/26/2019   NEUTROABS 1.5 (L) 09/26/2019    Imaging:  No results found.  Medications: I have reviewed the patient's current medications.  Assessment/Plan: 1. Sigmoid colon cancer, stage IV (pT4a,pN2b,M1c) ? Colonoscopy 03/24/2019-3 rectal polyps-hyperplastic polyps, distal colon biopsy-at least intramucosal adenocarcinoma, completely obstructing mass in the distal sigmoid colon, could not be traversed, intact mismatch repair protein expression ? 03/24/2019-CEA 56.1 ? 03/29/2019 CT abdomen/pelvis-circumferential thickening involving the entire mid and distal sigmoid colon to the level of the rectosigmoid junction, solid/cystic mass in the  left ovary, bilateral nephrolithiasis ? Robotic assisted low anterior resection, mesenteric lymphadenectomy, bilateral salpingo-oophorectomy 04/08/2019 ? Pathology ([facility] review of outside pathology) metastatic adenocarcinoma involving the peritoneum overlying the round ligament, serosa of the urinary bladder, serosa of the right ureter a sacral area, pelvic peritoneum, and left ovary.  Omental biopsy with focal mucin pools with no carcinoma cells identified, right hemidiaphragm biopsy involved by metastatic adenocarcinoma, invasive adenocarcinoma the sigmoid colon, moderately differentiated, T4a, perineural and vascular invasion present, 7/22 lymph nodes, multiple tumor deposits, resection margins negative ? Negative for PD-L1, low probability of MSI-high, HER-2 negative, negative for BRAF, NRAS and KRAS alterations ? CTs 05/19/2019-no evidence of metastatic disease, findings suspicious for colitis of the transverse and ascending colon, small amount of ascites in the cul-de-sac, bilateral renal calculi ? Cycle 1 FOLFIRI 05/24/2019, bevacizumab added with cycle 2 ? Cycle 4 FOLFIRI/bevacizumab 07/05/2019 ? Cycle 5 FOLFIRI 07/27/2019, bevacizumab held secondary to hypertension ? Cycle 6 FOLFIRI 08/10/1999, bevacizumab held secondary to hypertension ? CTs 08/30/2019-no evidence of recurrent disease, new subsolid right lower lobe nodule felt to be inflammatory, emphysema ? Cycle 7 FOLFIRI 09/26/2019, bevacizumab remains on hold secondary to hypertension  2. 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 Kidney stones 5. Infected Port-A-Cath 09/12/2019-placed on Augmentin, referred for Port-A-Cath removal; Port-A-Cath removed 09/14/2019; PICC line placed 09/14/2019; culture staph aureus.  Course of Septra completed.    Disposition: April [name] appears stable.  She has completed 6 cycles of FOLFIRI.  Plan to proceed with cycle 7 today as scheduled.  Subsequent treatments will be given on a 3-week rather than 2-week  schedule.  We reviewed the  CBC from today.  Counts adequate to proceed with treatment.  She has mild neutropenia.  She understands to contact the office with fever, chills, other signs of infection.  She will return for a repeat CBC in 1 week.  The previous Port-A-Cath site has not healed.  She will follow up with interventional radiology as scheduled later today.  She will return for lab, follow-up, FOLFIRI in 3 weeks.  She will contact the office in the interim as outlined above or with any other problems.  Patient seen with Dr. Benay Spice.    Ned Card ANP/GNP-BC   09/26/2019  11:33 AM This was a shared visit with Ned Card.  April [name] was interviewed and examined.  She underwent removal of an infected Port-A-Cath 09/14/2019.  The wound appears to be healing.  The plan is to resume FOLFIRI chemotherapy today.  She would like to keep the PICC in place.  The neutrophil count is mildly decreased today.  She will call for a fever.  We will check a CBC next week.  Julieanne Manson, MD

## 2019-09-26 NOTE — Progress Notes (Signed)
No documentation of last dressing change. PICC dressing was changed today and message sent to provider's nurse for flush appointment and dressing change.

## 2019-09-26 NOTE — Patient Instructions (Signed)
PICC Home Care Guide  A peripherally inserted central catheter (PICC) is a form of IV access that allows medicines and IV fluids to be quickly distributed throughout the body. The PICC is a long, thin, flexible tube (catheter) that is inserted into a vein in the upper arm. The catheter ends in a large vein in the chest (superior vena cava, or SVC). After the PICC is inserted, a chest X-ray may be done to make sure that it is in the correct place. A PICC may be placed for different reasons, such as:  To give medicines and liquid nutrition.  To give IV fluids and blood products.  If there is trouble placing a peripheral intravenous (PIV) catheter. If taken care of properly, a PICC can remain in place for several months. Having a PICC can also allow a person to go home from the hospital sooner. Medicine and PICC care can be managed at home by a family member, caregiver, or home health care team. What are the risks? Generally, having a PICC is safe. However, problems may occur, including:  A blood clot (thrombus) forming in or at the tip of the PICC.  A blood clot forming in a vein (deep vein thrombosis) or traveling to the lung (pulmonary embolism).  Inflammation of the vein (phlebitis) in which the PICC is placed.  Infection. Central line associated blood stream infection (CLABSI) is a serious infection that often requires hospitalization.  PICC movement (malposition). The PICC tip may move from its original position due to excessive physical activity, forceful coughing, sneezing, or vomiting.  A break or cut in the PICC. It is important not to use scissors near the PICC.  Nerve or tendon irritation or injury during PICC insertion. How to take care of your PICC Preventing problems  You and any caregivers should wash your hands often with soap. Wash hands: ? Before touching the PICC line or the infusion device. ? Before changing a bandage (dressing).  Flush the PICC as told by your  health care provider. Let your health care provider know right away if the PICC is hard to flush or does not flush. Do not use force to flush the PICC.  Do not use a syringe that is less than 10 mL to flush the PICC.  Avoid blood pressure checks on the arm in which the PICC is placed.  Never pull or tug on the PICC.  Do not take the PICC out yourself. Only a trained clinical professional should remove the PICC.  Use clean and sterile supplies only. Keep the supplies in a dry place. Do not reuse needles, syringes, or any other supplies. Doing that can lead to infection.  Keep pets and children away from your PICC line.  Check the PICC insertion site every day for signs of infection. Check for: ? Leakage. ? Redness, swelling, or pain. ? Fluid or blood. ? Warmth. ? Pus or a bad smell. PICC dressing care  Keep your PICC bandage (dressing) clean and dry to prevent infection.  Do not take baths, swim, or use a hot tub until your health care provider approves. Ask your health care provider if you can take showers. You may only be allowed to take sponge baths for bathing. When you are allowed to shower: ? Ask your health care provider to teach you how to wrap the PICC line. ? Cover the PICC line with clear plastic wrap and tape to keep it dry while showering.  Follow instructions from your health care provider   about how to take care of your insertion site and dressing. Make sure you: ? Wash your hands with soap and water before you change your bandage (dressing). If soap and water are not available, use hand sanitizer. ? Change your dressing as told by your health care provider. ? Leave stitches (sutures), skin glue, or adhesive strips in place. These skin closures may need to stay in place for 2 weeks or longer. If adhesive strip edges start to loosen and curl up, you may trim the loose edges. Do not remove adhesive strips completely unless your health care provider tells you to do  that.  Change your PICC dressing if it becomes loose or wet. General instructions   Carry your PICC identification card or wear a medical alert bracelet at all times.  Keep the tube clamped at all times, unless it is being used.  Carry a smooth-edge clamp with you at all times to place on the tube if it breaks.  Do not use scissors or sharp objects near the tube.  You may bend your arm and move it freely. If your PICC is near or at the bend of your elbow, avoid activity with repeated motion at the elbow.  Avoid lifting heavy objects as told by your health care provider.  Keep all follow-up visits as told by your health care provider. This is important. Disposal of supplies  Throw away any syringes in a disposal container that is meant for sharp items (sharps container). You can buy a sharps container from a pharmacy, or you can make one by using an empty hard plastic bottle with a cover.  Place any used dressings or infusion bags into a plastic bag. Throw that bag in the trash. Contact a health care provider if:  You have pain in your arm, ear, face, or teeth.  You have a fever or chills.  You have redness, swelling, or pain around the insertion site.  You have fluid or blood coming from the insertion site.  Your insertion site feels warm to the touch.  You have pus or a bad smell coming from the insertion site.  Your skin feels hard and raised around the insertion site. Get help right away if:  Your PICC is accidentally pulled all the way out. If this happens, cover the insertion site with a bandage or gauze dressing. Do not throw the PICC away. Your health care provider will need to check it.  Your PICC was tugged or pulled and has partially come out. Do not  push the PICC back in.  You cannot flush the PICC, it is hard to flush, or the PICC leaks around the insertion site when it is flushed.  You hear a "flushing" sound when the PICC is flushed.  You feel your  heart racing or skipping beats.  There is a hole or tear in the PICC.  You have swelling in the arm in which the PICC was inserted.  You have a red streak going up your arm from where the PICC was inserted. Summary  A peripherally inserted central catheter (PICC) is a long, thin, flexible tube (catheter) that is inserted into a vein in the upper arm.  The PICC is inserted using a sterile technique by a specially trained nurse or physician. Only a trained clinical professional should remove it.  Keep your PICC identification card with you at all times.  Avoid blood pressure checks on the arm in which the PICC is placed.  If cared for   properly, a PICC can remain in place for several months. Having a PICC can also allow a person to go home from the hospital sooner. This information is not intended to replace advice given to you by your health care provider. Make sure you discuss any questions you have with your health care provider. Document Revised: 12/12/2016 Document Reviewed: 02/02/2016 Elsevier Patient Education  2020 [employer]  

## 2019-09-26 NOTE — Patient Instructions (Signed)
[facility] Discharge Instructions for Patients Receiving Chemotherapy  Today you received the following chemotherapy agents: irinotecan/leucovorin/fluorouracil.  To help prevent nausea and vomiting after your treatment, we encourage you to take your nausea medication as directed.   If you develop nausea and vomiting that is not controlled by your nausea medication, call the clinic.   BELOW ARE SYMPTOMS THAT SHOULD BE REPORTED IMMEDIATELY:  *FEVER GREATER THAN 100.5 F  *CHILLS WITH OR WITHOUT FEVER  NAUSEA AND VOMITING THAT IS NOT CONTROLLED WITH YOUR NAUSEA MEDICATION  *UNUSUAL SHORTNESS OF BREATH  *UNUSUAL BRUISING OR BLEEDING  TENDERNESS IN MOUTH AND THROAT WITH OR WITHOUT PRESENCE OF ULCERS  *URINARY PROBLEMS  *BOWEL PROBLEMS  UNUSUAL RASH Items with * indicate a potential emergency and should be followed up as soon as possible.  Feel free to call the clinic should you have any questions or concerns. The clinic phone number is [phone].  Please show the CHEMO ALERT CARD at check-in to the Emergency Department and triage nurse.  

## 2019-09-28 ENCOUNTER — Other Ambulatory Visit: Payer: Self-pay

## 2019-09-28 ENCOUNTER — Inpatient Hospital Stay: Payer: Medicaid Other

## 2019-09-28 VITALS — BP 134/94 | HR 79 | Temp 98.5°F | Resp 20

## 2019-09-28 DIAGNOSIS — C19 Malignant neoplasm of rectosigmoid junction: Secondary | ICD-10-CM

## 2019-09-28 DIAGNOSIS — Z5111 Encounter for antineoplastic chemotherapy: Secondary | ICD-10-CM | POA: Diagnosis not present

## 2019-09-28 MED ORDER — SODIUM CHLORIDE 0.9% FLUSH
3.0000 mL | INTRAVENOUS | Status: DC | PRN
  Administered 2019-09-28: 3 mL
  Filled 2019-09-28: qty 10

## 2019-09-28 MED ORDER — HEPARIN SOD (PORK) LOCK FLUSH 100 UNIT/ML IV SOLN
250.0000 [IU] | Freq: Once | INTRAVENOUS | Status: AC | PRN
  Administered 2019-09-28: 250 [IU]
  Filled 2019-09-28: qty 5

## 2019-09-30 ENCOUNTER — Other Ambulatory Visit (HOSPITAL_COMMUNITY): Payer: Self-pay | Admitting: Radiology

## 2019-09-30 ENCOUNTER — Ambulatory Visit (HOSPITAL_COMMUNITY)
Admission: RE | Admit: 2019-09-30 | Discharge: 2019-09-30 | Disposition: A | Discharge status: Home / Self Care | Payer: Medicaid Other | Source: Ambulatory Visit | Attending: Radiology | Admitting: Radiology

## 2019-09-30 ENCOUNTER — Inpatient Hospital Stay: Payer: Medicaid Other

## 2019-09-30 ENCOUNTER — Other Ambulatory Visit: Payer: Self-pay

## 2019-09-30 DIAGNOSIS — Z95828 Presence of other vascular implants and grafts: Secondary | ICD-10-CM

## 2019-09-30 DIAGNOSIS — C20 Malignant neoplasm of rectum: Secondary | ICD-10-CM | POA: Diagnosis not present

## 2019-09-30 DIAGNOSIS — Z5111 Encounter for antineoplastic chemotherapy: Secondary | ICD-10-CM | POA: Diagnosis not present

## 2019-09-30 HISTORY — PX: IR RADIOLOGIST EVAL & MGMT: IMG5224

## 2019-09-30 MED ORDER — HEPARIN SOD (PORK) LOCK FLUSH 100 UNIT/ML IV SOLN
500.0000 [IU] | Freq: Once | INTRAVENOUS | Status: AC
  Administered 2019-09-30: 250 [IU] via INTRAVENOUS
  Filled 2019-09-30: qty 5

## 2019-09-30 MED ORDER — SODIUM CHLORIDE 0.9% FLUSH
10.0000 mL | INTRAVENOUS | Status: DC | PRN
  Administered 2019-09-30: 10 mL via INTRAVENOUS
  Filled 2019-09-30: qty 10

## 2019-09-30 NOTE — Patient Instructions (Signed)
PICC Home Care Guide  A peripherally inserted central catheter (PICC) is a form of IV access that allows medicines and IV fluids to be quickly distributed throughout the body. The PICC is a long, thin, flexible tube (catheter) that is inserted into a vein in the upper arm. The catheter ends in a large vein in the chest (superior vena cava, or SVC). After the PICC is inserted, a chest X-ray may be done to make sure that it is in the correct place. A PICC may be placed for different reasons, such as:  To give medicines and liquid nutrition.  To give IV fluids and blood products.  If there is trouble placing a peripheral intravenous (PIV) catheter. If taken care of properly, a PICC can remain in place for several months. Having a PICC can also allow a person to go home from the hospital sooner. Medicine and PICC care can be managed at home by a family member, caregiver, or home health care team. What are the risks? Generally, having a PICC is safe. However, problems may occur, including:  A blood clot (thrombus) forming in or at the tip of the PICC.  A blood clot forming in a vein (deep vein thrombosis) or traveling to the lung (pulmonary embolism).  Inflammation of the vein (phlebitis) in which the PICC is placed.  Infection. Central line associated blood stream infection (CLABSI) is a serious infection that often requires hospitalization.  PICC movement (malposition). The PICC tip may move from its original position due to excessive physical activity, forceful coughing, sneezing, or vomiting.  A break or cut in the PICC. It is important not to use scissors near the PICC.  Nerve or tendon irritation or injury during PICC insertion. How to take care of your PICC Preventing problems  You and any caregivers should wash your hands often with soap. Wash hands: ? Before touching the PICC line or the infusion device. ? Before changing a bandage (dressing).  Flush the PICC as told by your  health care provider. Let your health care provider know right away if the PICC is hard to flush or does not flush. Do not use force to flush the PICC.  Do not use a syringe that is less than 10 mL to flush the PICC.  Avoid blood pressure checks on the arm in which the PICC is placed.  Never pull or tug on the PICC.  Do not take the PICC out yourself. Only a trained clinical professional should remove the PICC.  Use clean and sterile supplies only. Keep the supplies in a dry place. Do not reuse needles, syringes, or any other supplies. Doing that can lead to infection.  Keep pets and children away from your PICC line.  Check the PICC insertion site every day for signs of infection. Check for: ? Leakage. ? Redness, swelling, or pain. ? Fluid or blood. ? Warmth. ? Pus or a bad smell. PICC dressing care  Keep your PICC bandage (dressing) clean and dry to prevent infection.  Do not take baths, swim, or use a hot tub until your health care provider approves. Ask your health care provider if you can take showers. You may only be allowed to take sponge baths for bathing. When you are allowed to shower: ? Ask your health care provider to teach you how to wrap the PICC line. ? Cover the PICC line with clear plastic wrap and tape to keep it dry while showering.  Follow instructions from your health care provider   about how to take care of your insertion site and dressing. Make sure you: ? Wash your hands with soap and water before you change your bandage (dressing). If soap and water are not available, use hand sanitizer. ? Change your dressing as told by your health care provider. ? Leave stitches (sutures), skin glue, or adhesive strips in place. These skin closures may need to stay in place for 2 weeks or longer. If adhesive strip edges start to loosen and curl up, you may trim the loose edges. Do not remove adhesive strips completely unless your health care provider tells you to do  that.  Change your PICC dressing if it becomes loose or wet. General instructions   Carry your PICC identification card or wear a medical alert bracelet at all times.  Keep the tube clamped at all times, unless it is being used.  Carry a smooth-edge clamp with you at all times to place on the tube if it breaks.  Do not use scissors or sharp objects near the tube.  You may bend your arm and move it freely. If your PICC is near or at the bend of your elbow, avoid activity with repeated motion at the elbow.  Avoid lifting heavy objects as told by your health care provider.  Keep all follow-up visits as told by your health care provider. This is important. Disposal of supplies  Throw away any syringes in a disposal container that is meant for sharp items (sharps container). You can buy a sharps container from a pharmacy, or you can make one by using an empty hard plastic bottle with a cover.  Place any used dressings or infusion bags into a plastic bag. Throw that bag in the trash. Contact a health care provider if:  You have pain in your arm, ear, face, or teeth.  You have a fever or chills.  You have redness, swelling, or pain around the insertion site.  You have fluid or blood coming from the insertion site.  Your insertion site feels warm to the touch.  You have pus or a bad smell coming from the insertion site.  Your skin feels hard and raised around the insertion site. Get help right away if:  Your PICC is accidentally pulled all the way out. If this happens, cover the insertion site with a bandage or gauze dressing. Do not throw the PICC away. Your health care provider will need to check it.  Your PICC was tugged or pulled and has partially come out. Do not  push the PICC back in.  You cannot flush the PICC, it is hard to flush, or the PICC leaks around the insertion site when it is flushed.  You hear a "flushing" sound when the PICC is flushed.  You feel your  heart racing or skipping beats.  There is a hole or tear in the PICC.  You have swelling in the arm in which the PICC was inserted.  You have a red streak going up your arm from where the PICC was inserted. Summary  A peripherally inserted central catheter (PICC) is a long, thin, flexible tube (catheter) that is inserted into a vein in the upper arm.  The PICC is inserted using a sterile technique by a specially trained nurse or physician. Only a trained clinical professional should remove it.  Keep your PICC identification card with you at all times.  Avoid blood pressure checks on the arm in which the PICC is placed.  If cared for   properly, a PICC can remain in place for several months. Having a PICC can also allow a person to go home from the hospital sooner. This information is not intended to replace advice given to you by your health care provider. Make sure you discuss any questions you have with your health care provider. Document Revised: 12/12/2016 Document Reviewed: 02/02/2016 Elsevier Patient Education  2020 [employer]  

## 2019-10-03 ENCOUNTER — Inpatient Hospital Stay: Payer: Medicaid Other

## 2019-10-03 ENCOUNTER — Other Ambulatory Visit: Payer: Self-pay

## 2019-10-03 DIAGNOSIS — C20 Malignant neoplasm of rectum: Secondary | ICD-10-CM

## 2019-10-03 DIAGNOSIS — C19 Malignant neoplasm of rectosigmoid junction: Secondary | ICD-10-CM

## 2019-10-03 DIAGNOSIS — Z5111 Encounter for antineoplastic chemotherapy: Secondary | ICD-10-CM | POA: Diagnosis not present

## 2019-10-03 LAB — CBC WITH DIFFERENTIAL (CANCER CENTER ONLY)
Abs Immature Granulocytes: 0.02 10*3/uL (ref 0.00–0.07)
Basophils Absolute: 0 10*3/uL (ref 0.0–0.1)
Basophils Relative: 0 %
Eosinophils Absolute: 0.6 10*3/uL — ABNORMAL HIGH (ref 0.0–0.5)
Eosinophils Relative: 7 %
HCT: 36.3 % (ref 36.0–46.0)
Hemoglobin: 12.1 g/dL (ref 12.0–15.0)
Immature Granulocytes: 0 %
Lymphocytes Relative: 61 %
Lymphs Abs: 5.6 10*3/uL — ABNORMAL HIGH (ref 0.7–4.0)
MCH: 31.1 pg (ref 26.0–34.0)
MCHC: 33.3 g/dL (ref 30.0–36.0)
MCV: 93.3 fL (ref 80.0–100.0)
Monocytes Absolute: 0.4 10*3/uL (ref 0.1–1.0)
Monocytes Relative: 4 %
Neutro Abs: 2.6 10*3/uL (ref 1.7–7.7)
Neutrophils Relative %: 28 %
Platelet Count: 210 10*3/uL (ref 150–400)
RBC: 3.89 MIL/uL (ref 3.87–5.11)
RDW: 13.2 % (ref 11.5–15.5)
WBC Count: 9.2 10*3/uL (ref 4.0–10.5)
nRBC: 0 % (ref 0.0–0.2)

## 2019-10-03 MED ORDER — SODIUM CHLORIDE 0.9% FLUSH
10.0000 mL | Freq: Once | INTRAVENOUS | Status: AC
  Administered 2019-10-03: 10 mL
  Filled 2019-10-03: qty 10

## 2019-10-07 ENCOUNTER — Telehealth: Payer: Self-pay | Admitting: *Deleted

## 2019-10-07 ENCOUNTER — Inpatient Hospital Stay: Payer: Medicaid Other

## 2019-10-07 ENCOUNTER — Ambulatory Visit (HOSPITAL_COMMUNITY): Payer: Medicaid Other

## 2019-10-07 ENCOUNTER — Other Ambulatory Visit: Payer: Self-pay

## 2019-10-07 DIAGNOSIS — Z5111 Encounter for antineoplastic chemotherapy: Secondary | ICD-10-CM | POA: Diagnosis not present

## 2019-10-07 DIAGNOSIS — C19 Malignant neoplasm of rectosigmoid junction: Secondary | ICD-10-CM

## 2019-10-07 MED ORDER — SODIUM CHLORIDE (PF) 0.9 % IJ SOLN: 10.0000 mL | INTRAMUSCULAR | 1 refills | Status: DC

## 2019-10-07 MED ORDER — SODIUM CHLORIDE 0.9% FLUSH
10.0000 mL | Freq: Once | INTRAVENOUS | Status: AC
  Administered 2019-10-07: 10 mL
  Filled 2019-10-07: qty 10

## 2019-10-07 MED ORDER — HEPARIN SOD (PORK) LOCK FLUSH 100 UNIT/ML IV SOLN
250.0000 [IU] | INTRAVENOUS | 1 refills | Status: DC
Start: 2019-10-07 — End: 2019-11-21

## 2019-10-07 NOTE — Patient Instructions (Signed)
Tunneled Central Venous Catheter Flushing Guide  It is important to flush your tunneled central venous catheter each time you use it, both before and after you use it. Flushing your catheter will help prevent it from clogging. What are the risks? Risks may include:  Infection.  Air getting into the catheter and bloodstream. Supplies needed:  A clean pair of gloves.  A disinfecting wipe. Use an alcohol wipe, chlorhexidine wipe, or iodine wipe as told by your health care provider.  A 10 mL syringe that has been prefilled with saline solution.  An empty 10 mL syringe, if a substance called heparin was injected into your catheter. How to flush your catheter When you flush your catheter, make sure you follow any specific instructions from your health care provider or the manufacturer. These are general guidelines. Flushing your catheter before use If there is heparin in your catheter: 1. Wash your hands with soap and water. 2. Put on gloves. 3. Scrub the injection cap for a minimum of 15 seconds with a disinfecting wipe. 4. Unclamp the catheter. 5. Attach the empty syringe to the injection cap. 6. Pull the syringe plunger back and withdraw 10 mL of blood. 7. Place the syringe into an appropriate waste container. 8. Scrub the injection cap for 15 seconds with a disinfecting wipe. 9. Attach the prefilled syringe to the injection cap. 10. Flush the catheter by pushing the plunger forward until all the liquid from the syringe is in the catheter. 11. Remove the syringe from the injection cap. 12. Clamp the catheter. If there is no heparin in your catheter: 1. Wash your hands with soap and water. 2. Put on gloves. 3. Scrub the injection cap for 15 seconds with a disinfecting wipe. 4. Unclamp the catheter. 5. Attach the prefilled syringe to the injection cap. 6. Flush the catheter by pushing the plunger forward until 5 mL of the liquid from the syringe is in the catheter. 7. Pull back on  the syringe until you see blood in the catheter. 8. If you have been asked to collect any blood, follow your health care provider's instructions. Otherwise, flush the catheter with the rest of the solution from the syringe. 9. Remove the syringe from the injection cap. 10. Clamp the catheter.  Flushing your catheter after use 1. Wash your hands with soap and water. 2. Put on gloves. 3. Scrub the injection cap for 15 seconds with a disinfecting wipe. 4. Unclamp the catheter. 5. Attach the prefilled syringe to the injection cap. 6. Flush the catheter by pushing the plunger forward until all of the liquid from the syringe is in the catheter. 7. Remove the syringe from the injection cap. 8. Clamp the catheter. Problems and solutions  If blood cannot be completely cleared from the injection cap, you may need to have the injection cap replaced.  If the catheter is difficult to flush, use the pulsing method. The pulsing method involves pushing only a few milliliters of solution into the catheter at a time and pausing between pushes.  If you do not see blood in the catheter when you pull back on the syringe, change your body position, such as by raising your arms above your head. Take a deep breath and cough. Then, pull back on the syringe. If you still do not see blood, flush the catheter with a small amount of solution. Then, change positions again and take a breath or cough. Pull back on the syringe again. If you still do not see   blood, finish flushing the catheter and contact your health care provider. Do not use your catheter until your health care provider says it is okay. General tips  Have someone help you flush your catheter, if possible.  Do not force fluid through your catheter.  Do not use a syringe that is larger or smaller than 10 mL. Using a smaller syringe can make the catheter burst.  Do not use your catheter without flushing it first if it has heparin in it. Contact a health  care provider if:  You cannot see any blood in the catheter when you flush it before using it.  Your catheter is difficult to flush. Get help right away if:  You cannot flush the catheter.  The catheter leaks when you flush it or when there is fluid in it.  There are cracks or breaks in the catheter. Summary  It is important to flush your tunneled central venous catheter each time you use it, both before and after you use it.  Scrub the injection cap for 15 seconds with a disinfecting wipe before and after you flush it.  When you flush your catheter, make sure you follow any specific instructions from your health care provider or the manufacturer.  Get help right away if you cannot flush the catheter. This information is not intended to replace advice given to you by your health care provider. Make sure you discuss any questions you have with your health care provider. Document Revised: 09/24/2018 Document Reviewed: 03/17/2018 Elsevier Patient Education  2020 Elsevier Inc.  

## 2019-10-07 NOTE — Telephone Encounter (Signed)
Needs appointments for PICC flush 3/week and asking if home health RN could do this? Informed her that home care agencies will not come just for PICC flush and agencies are not accepting non-urgent referrals at this time due to staffing. Inquired if she has someone at home that could be taught how to flush PICC and we can teach them here. Will send script for saline and heparin to her pharmacy to determine cost after insurance. High priority scheduling message sent.

## 2019-10-09 ENCOUNTER — Other Ambulatory Visit: Payer: Self-pay | Admitting: Oncology

## 2019-10-11 NOTE — Progress Notes (Signed)
Pharmacist Chemotherapy Monitoring - Follow Up Assessment    I verify that I have reviewed each item in the below checklist:  . Regimen for the patient is scheduled for the appropriate day and plan matches scheduled date. Marland Kitchen Appropriate non-routine labs are ordered dependent on drug ordered. . If applicable, additional medications reviewed and ordered per protocol based on lifetime cumulative doses and/or treatment regimen.   Plan for follow-up and/or issues identified: Yes . I-vent associated with next due treatment: Yes . MD and/or nursing notified: Yes   Kennith Center, Pharm.D., CPP 10/11/2019@5 :11 PM

## 2019-10-12 ENCOUNTER — Inpatient Hospital Stay: Payer: Medicaid Other

## 2019-10-12 ENCOUNTER — Other Ambulatory Visit: Payer: Self-pay

## 2019-10-12 VITALS — BP 138/106 | HR 92 | Temp 98.7°F | Resp 16

## 2019-10-12 DIAGNOSIS — C19 Malignant neoplasm of rectosigmoid junction: Secondary | ICD-10-CM

## 2019-10-12 DIAGNOSIS — Z5111 Encounter for antineoplastic chemotherapy: Secondary | ICD-10-CM | POA: Diagnosis not present

## 2019-10-12 MED ORDER — SODIUM CHLORIDE 0.9% FLUSH
10.0000 mL | Freq: Once | INTRAVENOUS | Status: AC
  Administered 2019-10-12: 10 mL
  Filled 2019-10-12: qty 10

## 2019-10-12 NOTE — Patient Instructions (Signed)
PICC Home Care Guide  A peripherally inserted central catheter (PICC) is a form of IV access that allows medicines and IV fluids to be quickly distributed throughout the body. The PICC is a long, thin, flexible tube (catheter) that is inserted into a vein in the upper arm. The catheter ends in a large vein in the chest (superior vena cava, or SVC). After the PICC is inserted, a chest X-ray may be done to make sure that it is in the correct place. A PICC may be placed for different reasons, such as:  To give medicines and liquid nutrition.  To give IV fluids and blood products.  If there is trouble placing a peripheral intravenous (PIV) catheter. If taken care of properly, a PICC can remain in place for several months. Having a PICC can also allow a person to go home from the hospital sooner. Medicine and PICC care can be managed at home by a family member, caregiver, or home health care team. What are the risks? Generally, having a PICC is safe. However, problems may occur, including:  A blood clot (thrombus) forming in or at the tip of the PICC.  A blood clot forming in a vein (deep vein thrombosis) or traveling to the lung (pulmonary embolism).  Inflammation of the vein (phlebitis) in which the PICC is placed.  Infection. Central line associated blood stream infection (CLABSI) is a serious infection that often requires hospitalization.  PICC movement (malposition). The PICC tip may move from its original position due to excessive physical activity, forceful coughing, sneezing, or vomiting.  A break or cut in the PICC. It is important not to use scissors near the PICC.  Nerve or tendon irritation or injury during PICC insertion. How to take care of your PICC Preventing problems  You and any caregivers should wash your hands often with soap. Wash hands: ? Before touching the PICC line or the infusion device. ? Before changing a bandage (dressing).  Flush the PICC as told by your  health care provider. Let your health care provider know right away if the PICC is hard to flush or does not flush. Do not use force to flush the PICC.  Do not use a syringe that is less than 10 mL to flush the PICC.  Avoid blood pressure checks on the arm in which the PICC is placed.  Never pull or tug on the PICC.  Do not take the PICC out yourself. Only a trained clinical professional should remove the PICC.  Use clean and sterile supplies only. Keep the supplies in a dry place. Do not reuse needles, syringes, or any other supplies. Doing that can lead to infection.  Keep pets and children away from your PICC line.  Check the PICC insertion site every day for signs of infection. Check for: ? Leakage. ? Redness, swelling, or pain. ? Fluid or blood. ? Warmth. ? Pus or a bad smell. PICC dressing care  Keep your PICC bandage (dressing) clean and dry to prevent infection.  Do not take baths, swim, or use a hot tub until your health care provider approves. Ask your health care provider if you can take showers. You may only be allowed to take sponge baths for bathing. When you are allowed to shower: ? Ask your health care provider to teach you how to wrap the PICC line. ? Cover the PICC line with clear plastic wrap and tape to keep it dry while showering.  Follow instructions from your health care provider   about how to take care of your insertion site and dressing. Make sure you: ? Wash your hands with soap and water before you change your bandage (dressing). If soap and water are not available, use hand sanitizer. ? Change your dressing as told by your health care provider. ? Leave stitches (sutures), skin glue, or adhesive strips in place. These skin closures may need to stay in place for 2 weeks or longer. If adhesive strip edges start to loosen and curl up, you may trim the loose edges. Do not remove adhesive strips completely unless your health care provider tells you to do  that.  Change your PICC dressing if it becomes loose or wet. General instructions   Carry your PICC identification card or wear a medical alert bracelet at all times.  Keep the tube clamped at all times, unless it is being used.  Carry a smooth-edge clamp with you at all times to place on the tube if it breaks.  Do not use scissors or sharp objects near the tube.  You may bend your arm and move it freely. If your PICC is near or at the bend of your elbow, avoid activity with repeated motion at the elbow.  Avoid lifting heavy objects as told by your health care provider.  Keep all follow-up visits as told by your health care provider. This is important. Disposal of supplies  Throw away any syringes in a disposal container that is meant for sharp items (sharps container). You can buy a sharps container from a pharmacy, or you can make one by using an empty hard plastic bottle with a cover.  Place any used dressings or infusion bags into a plastic bag. Throw that bag in the trash. Contact a health care provider if:  You have pain in your arm, ear, face, or teeth.  You have a fever or chills.  You have redness, swelling, or pain around the insertion site.  You have fluid or blood coming from the insertion site.  Your insertion site feels warm to the touch.  You have pus or a bad smell coming from the insertion site.  Your skin feels hard and raised around the insertion site. Get help right away if:  Your PICC is accidentally pulled all the way out. If this happens, cover the insertion site with a bandage or gauze dressing. Do not throw the PICC away. Your health care provider will need to check it.  Your PICC was tugged or pulled and has partially come out. Do not  push the PICC back in.  You cannot flush the PICC, it is hard to flush, or the PICC leaks around the insertion site when it is flushed.  You hear a "flushing" sound when the PICC is flushed.  You feel your  heart racing or skipping beats.  There is a hole or tear in the PICC.  You have swelling in the arm in which the PICC was inserted.  You have a red streak going up your arm from where the PICC was inserted. Summary  A peripherally inserted central catheter (PICC) is a long, thin, flexible tube (catheter) that is inserted into a vein in the upper arm.  The PICC is inserted using a sterile technique by a specially trained nurse or physician. Only a trained clinical professional should remove it.  Keep your PICC identification card with you at all times.  Avoid blood pressure checks on the arm in which the PICC is placed.  If cared for   properly, a PICC can remain in place for several months. Having a PICC can also allow a person to go home from the hospital sooner. This information is not intended to replace advice given to you by your health care provider. Make sure you discuss any questions you have with your health care provider. Document Revised: 12/12/2016 Document Reviewed: 02/02/2016 Elsevier Patient Education  2020 [employer]  

## 2019-10-14 ENCOUNTER — Inpatient Hospital Stay: Payer: Medicaid Other | Attending: Oncology

## 2019-10-14 DIAGNOSIS — T451X5A Adverse effect of antineoplastic and immunosuppressive drugs, initial encounter: Secondary | ICD-10-CM | POA: Insufficient documentation

## 2019-10-14 DIAGNOSIS — D701 Agranulocytosis secondary to cancer chemotherapy: Secondary | ICD-10-CM | POA: Insufficient documentation

## 2019-10-14 DIAGNOSIS — C786 Secondary malignant neoplasm of retroperitoneum and peritoneum: Secondary | ICD-10-CM | POA: Insufficient documentation

## 2019-10-14 DIAGNOSIS — Z5111 Encounter for antineoplastic chemotherapy: Secondary | ICD-10-CM | POA: Insufficient documentation

## 2019-10-14 DIAGNOSIS — C7962 Secondary malignant neoplasm of left ovary: Secondary | ICD-10-CM | POA: Insufficient documentation

## 2019-10-14 DIAGNOSIS — C7989 Secondary malignant neoplasm of other specified sites: Secondary | ICD-10-CM | POA: Insufficient documentation

## 2019-10-14 DIAGNOSIS — Z5189 Encounter for other specified aftercare: Secondary | ICD-10-CM | POA: Insufficient documentation

## 2019-10-14 DIAGNOSIS — C187 Malignant neoplasm of sigmoid colon: Secondary | ICD-10-CM | POA: Insufficient documentation

## 2019-10-16 ENCOUNTER — Other Ambulatory Visit: Payer: Self-pay | Admitting: Oncology

## 2019-10-17 ENCOUNTER — Inpatient Hospital Stay: Payer: Medicaid Other

## 2019-10-17 ENCOUNTER — Other Ambulatory Visit: Payer: Self-pay

## 2019-10-17 ENCOUNTER — Inpatient Hospital Stay (HOSPITAL_BASED_OUTPATIENT_CLINIC_OR_DEPARTMENT_OTHER): Payer: Medicaid Other | Admitting: Oncology

## 2019-10-17 VITALS — BP 145/103 | HR 81 | Temp 97.6°F | Resp 17 | Ht 67.0 in | Wt 148.8 lb

## 2019-10-17 DIAGNOSIS — D701 Agranulocytosis secondary to cancer chemotherapy: Secondary | ICD-10-CM | POA: Diagnosis not present

## 2019-10-17 DIAGNOSIS — C19 Malignant neoplasm of rectosigmoid junction: Secondary | ICD-10-CM | POA: Diagnosis not present

## 2019-10-17 DIAGNOSIS — Z5111 Encounter for antineoplastic chemotherapy: Secondary | ICD-10-CM | POA: Diagnosis present

## 2019-10-17 DIAGNOSIS — C7989 Secondary malignant neoplasm of other specified sites: Secondary | ICD-10-CM | POA: Diagnosis not present

## 2019-10-17 DIAGNOSIS — C20 Malignant neoplasm of rectum: Secondary | ICD-10-CM

## 2019-10-17 DIAGNOSIS — C7962 Secondary malignant neoplasm of left ovary: Secondary | ICD-10-CM | POA: Diagnosis not present

## 2019-10-17 DIAGNOSIS — C187 Malignant neoplasm of sigmoid colon: Secondary | ICD-10-CM | POA: Diagnosis present

## 2019-10-17 DIAGNOSIS — C786 Secondary malignant neoplasm of retroperitoneum and peritoneum: Secondary | ICD-10-CM | POA: Diagnosis not present

## 2019-10-17 DIAGNOSIS — T451X5A Adverse effect of antineoplastic and immunosuppressive drugs, initial encounter: Secondary | ICD-10-CM | POA: Diagnosis not present

## 2019-10-17 DIAGNOSIS — Z5189 Encounter for other specified aftercare: Secondary | ICD-10-CM | POA: Diagnosis not present

## 2019-10-17 LAB — CBC WITH DIFFERENTIAL (CANCER CENTER ONLY)
Abs Immature Granulocytes: 0 10*3/uL (ref 0.00–0.07)
Basophils Absolute: 0.1 10*3/uL (ref 0.0–0.1)
Basophils Relative: 1 %
Eosinophils Absolute: 0.6 10*3/uL — ABNORMAL HIGH (ref 0.0–0.5)
Eosinophils Relative: 11 %
HCT: 34.5 % — ABNORMAL LOW (ref 36.0–46.0)
Hemoglobin: 11.3 g/dL — ABNORMAL LOW (ref 12.0–15.0)
Immature Granulocytes: 0 %
Lymphocytes Relative: 52 %
Lymphs Abs: 3 10*3/uL (ref 0.7–4.0)
MCH: 31.4 pg (ref 26.0–34.0)
MCHC: 32.8 g/dL (ref 30.0–36.0)
MCV: 95.8 fL (ref 80.0–100.0)
Monocytes Absolute: 0.6 10*3/uL (ref 0.1–1.0)
Monocytes Relative: 11 %
Neutro Abs: 1.4 10*3/uL — ABNORMAL LOW (ref 1.7–7.7)
Neutrophils Relative %: 25 %
Platelet Count: 222 10*3/uL (ref 150–400)
RBC: 3.6 MIL/uL — ABNORMAL LOW (ref 3.87–5.11)
RDW: 14.6 % (ref 11.5–15.5)
WBC Count: 5.7 10*3/uL (ref 4.0–10.5)
nRBC: 0 % (ref 0.0–0.2)

## 2019-10-17 LAB — CMP (CANCER CENTER ONLY)
ALT: 28 U/L (ref 0–44)
AST: 24 U/L (ref 15–41)
Albumin: 3.5 g/dL (ref 3.5–5.0)
Alkaline Phosphatase: 96 U/L (ref 38–126)
Anion gap: 5 (ref 5–15)
BUN: 16 mg/dL (ref 6–20)
CO2: 27 mmol/L (ref 22–32)
Calcium: 9.1 mg/dL (ref 8.9–10.3)
Chloride: 108 mmol/L (ref 98–111)
Creatinine: 0.71 mg/dL (ref 0.44–1.00)
GFR, Est AFR Am: >60 mL/min (ref >60)
GFR, Estimated: >60 mL/min (ref >60)
Glucose, Bld: 81 mg/dL (ref 70–99)
Potassium: 3.9 mmol/L (ref 3.5–5.1)
Sodium: 140 mmol/L (ref 135–145)
Total Bilirubin: <0.2 mg/dL — ABNORMAL LOW (ref 0.3–1.2)
Total Protein: 6.8 g/dL (ref 6.5–8.1)

## 2019-10-17 LAB — CEA (IN HOUSE-CHCC): CEA (CHCC-In House): 2.24 ng/mL (ref 0.00–5.00)

## 2019-10-17 MED ORDER — SODIUM CHLORIDE 0.9% FLUSH
10.0000 mL | INTRAVENOUS | Status: DC | PRN
  Filled 2019-10-17: qty 10

## 2019-10-17 MED ORDER — PALONOSETRON HCL INJECTION 0.25 MG/5ML
0.2500 mg | Freq: Once | INTRAVENOUS | Status: AC
  Administered 2019-10-17: 0.25 mg via INTRAVENOUS

## 2019-10-17 MED ORDER — FLUOROURACIL CHEMO INJECTION 2.5 GM/50ML
400.0000 mg/m2 | Freq: Once | INTRAVENOUS | Status: AC
  Administered 2019-10-17: 650 mg via INTRAVENOUS
  Filled 2019-10-17: qty 13

## 2019-10-17 MED ORDER — PALONOSETRON HCL INJECTION 0.25 MG/5ML
INTRAVENOUS | Status: AC
  Filled 2019-10-17: qty 5

## 2019-10-17 MED ORDER — SODIUM CHLORIDE 0.9 % IV SOLN
Freq: Once | INTRAVENOUS | Status: AC
  Filled 2019-10-17: qty 250

## 2019-10-17 MED ORDER — SODIUM CHLORIDE 0.9 % IV SOLN
2400.0000 mg/m2 | INTRAVENOUS | Status: DC
  Administered 2019-10-17: 4000 mg via INTRAVENOUS
  Filled 2019-10-17: qty 80

## 2019-10-17 MED ORDER — SODIUM CHLORIDE 0.9 % IV SOLN
400.0000 mg/m2 | Freq: Once | INTRAVENOUS | Status: AC
  Administered 2019-10-17: 668 mg via INTRAVENOUS
  Filled 2019-10-17: qty 33.4

## 2019-10-17 MED ORDER — SODIUM CHLORIDE 0.9 % IV SOLN
10.0000 mg | Freq: Once | INTRAVENOUS | Status: AC
  Administered 2019-10-17: 10 mg via INTRAVENOUS
  Filled 2019-10-17: qty 10

## 2019-10-17 MED ORDER — HEPARIN SOD (PORK) LOCK FLUSH 100 UNIT/ML IV SOLN
500.0000 [IU] | Freq: Once | INTRAVENOUS | Status: DC | PRN
  Filled 2019-10-17: qty 5

## 2019-10-17 MED ORDER — ATROPINE SULFATE 1 MG/ML IJ SOLN
INTRAMUSCULAR | Status: AC
  Filled 2019-10-17: qty 1

## 2019-10-17 MED ORDER — SODIUM CHLORIDE 0.9 % IV SOLN
150.0000 mg | Freq: Once | INTRAVENOUS | Status: AC
  Administered 2019-10-17: 150 mg via INTRAVENOUS
  Filled 2019-10-17: qty 150

## 2019-10-17 MED ORDER — ATROPINE SULFATE 1 MG/ML IJ SOLN
0.5000 mg | Freq: Once | INTRAMUSCULAR | Status: AC | PRN
  Administered 2019-10-17: 0.5 mg via INTRAVENOUS

## 2019-10-17 MED ORDER — SODIUM CHLORIDE 0.9 % IV SOLN
180.0000 mg/m2 | Freq: Once | INTRAVENOUS | Status: AC
  Administered 2019-10-17: 300 mg via INTRAVENOUS
  Filled 2019-10-17: qty 15

## 2019-10-17 NOTE — Progress Notes (Signed)
[facility] OFFICE PROGRESS NOTE   Diagnosis: Colon cancer  INTERVAL HISTORY:   April [name] returns as scheduled. She completed another cycle of FOLFIRI on 09/26/2019. No nausea/vomiting following chemotherapy. The right chest Port-A-Cath site feels better. She has not taken blood pressure medication today.  Objective:  Vital signs in last 24 hours:  Blood pressure (!) 145/103, pulse 81, temperature 97.6 F (36.4 C), temperature source Tympanic, resp. rate 17, height '5\' 7"'  (1.702 m), weight 148 lb 12.8 oz (67.5 kg), SpO2 100 %.    HEENT: No thrush or ulcers Resp: Lungs clear bilaterally Cardio: Regular rate and rhythm GI: No hepatomegaly, no mass, nontender Vascular: No leg edema Skin: Right upper chest Port-A-Cath site has healed    Portacath/PICC-without erythema  Lab Results:  Lab Results  Component Value Date   WBC 5.7 10/17/2019   HGB 11.3 (L) 10/17/2019   HCT 34.5 (L) 10/17/2019   MCV 95.8 10/17/2019   PLT 222 10/17/2019   NEUTROABS 1.4 (L) 10/17/2019    CMP  Lab Results  Component Value Date   NA 139 09/26/2019   K 4.2 09/26/2019   CL 107 09/26/2019   CO2 26 09/26/2019   GLUCOSE 83 09/26/2019   BUN 14 09/26/2019   CREATININE 0.68 09/26/2019   CALCIUM 9.2 09/26/2019   PROT 7.3 09/26/2019   ALBUMIN 3.8 09/26/2019   AST 16 09/26/2019   ALT 11 09/26/2019   ALKPHOS 114 09/26/2019   BILITOT <0.2 (L) 09/26/2019   GFRNONAA >60 09/26/2019   GFRAA >60 09/26/2019    Lab Results  Component Value Date   CEA1 3.26 07/27/2019     Medications: I have reviewed the patient's current medications.   Assessment/Plan: 1. Sigmoid colon cancer, stage IV (pT4a,pN2b,M1c) ? Colonoscopy 03/24/2019-3 rectal polyps-hyperplastic polyps, distal colon biopsy-at least intramucosal adenocarcinoma, completely obstructing mass in the distal sigmoid colon, could not be traversed, intact mismatch repair protein expression ? 03/24/2019-CEA 56.1 ? 03/29/2019 CT  abdomen/pelvis-circumferential thickening involving the entire mid and distal sigmoid colon to the level of the rectosigmoid junction, solid/cystic mass in the left ovary, bilateral nephrolithiasis ? Robotic assisted low anterior resection, mesenteric lymphadenectomy, bilateral salpingo-oophorectomy 04/08/2019 ? Pathology ([facility] review of outside pathology) metastatic adenocarcinoma involving the peritoneum overlying the round ligament, serosa of the urinary bladder, serosa of the right ureter a sacral area, pelvic peritoneum, and left ovary.  Omental biopsy with focal mucin pools with no carcinoma cells identified, right hemidiaphragm biopsy involved by metastatic adenocarcinoma, invasive adenocarcinoma the sigmoid colon, moderately differentiated, T4a, perineural and vascular invasion present, 7/22 lymph nodes, multiple tumor deposits, resection margins negative ? Negative for PD-L1, low probability of MSI-high, HER-2 negative, negative for BRAF, NRAS and KRAS alterations ? CTs 05/19/2019-no evidence of metastatic disease, findings suspicious for colitis of the transverse and ascending colon, small amount of ascites in the cul-de-sac, bilateral renal calculi ? Cycle 1 FOLFIRI 05/24/2019, bevacizumab added with cycle 2 ? Cycle 4 FOLFIRI/bevacizumab 07/05/2019 ? Cycle 5 FOLFIRI 07/27/2019, bevacizumab held secondary to hypertension ? Cycle 6 FOLFIRI 08/10/1999, bevacizumab held secondary to hypertension ? CTs 08/30/2019-no evidence of recurrent disease, new subsolid right lower lobe nodule felt to be inflammatory, emphysema ? Cycle 7 FOLFIRI 09/26/2019, bevacizumab remains on hold secondary to hypertension ? Cycle 8 FOLFIRI 10/17/2019, bevacizumab held secondary to hypertension, Udenyca added for neutropenia  2. 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 Kidney stones 5. Infected Port-A-Cath 09/12/2019-placed on Augmentin, referred for Port-A-Cath removal; Port-A-Cath removed 09/14/2019; PICC line placed 09/14/2019; culture  staph aureus.  Course of Septra completed. 6. Neutropenia secondary to chemotherapy-Udenyca added with cycle 8 FOLFIRI     Disposition: Ms. Sicard appears stable. She has persistent hypertension. I recommended she follow-up with her primary provider for management of hypertension. Bevacizumab will remain on hold.  She is tolerating the FOLFIRI well. She has mild neutropenia today. We discussed the risk of proceeding with chemotherapy. She agrees to proceed with G-CSF support. We reviewed potential toxicities associated with G-CSF including the chance of a rash, splenic rupture, and bone pain.  Ms. Moffat will return for an office visit and chemotherapy in 3 weeks.  Betsy Coder, MD  10/17/2019  10:30 AM

## 2019-10-17 NOTE — Progress Notes (Signed)
Per Dr. Benay Spice: OK to tx today w/ANC 1.4--will receive Udenyca on day 3. Holding Avastin due to hypertension.

## 2019-10-17 NOTE — Patient Instructions (Signed)
[facility] Discharge Instructions for Patients Receiving Chemotherapy  Today you received the following chemotherapy agents: irinotecan, leucovorin, and fluorouracil.  To help prevent nausea and vomiting after your treatment, we encourage you to take your nausea medication as directed.   If you develop nausea and vomiting that is not controlled by your nausea medication, call the clinic.   BELOW ARE SYMPTOMS THAT SHOULD BE REPORTED IMMEDIATELY:  *FEVER GREATER THAN 100.5 F  *CHILLS WITH OR WITHOUT FEVER  NAUSEA AND VOMITING THAT IS NOT CONTROLLED WITH YOUR NAUSEA MEDICATION  *UNUSUAL SHORTNESS OF BREATH  *UNUSUAL BRUISING OR BLEEDING  TENDERNESS IN MOUTH AND THROAT WITH OR WITHOUT PRESENCE OF ULCERS  *URINARY PROBLEMS  *BOWEL PROBLEMS  UNUSUAL RASH Items with * indicate a potential emergency and should be followed up as soon as possible.  Feel free to call the clinic should you have any questions or concerns. The clinic phone number is [phone].  Please show the CHEMO ALERT CARD at check-in to the Emergency Department and triage nurse.   

## 2019-10-18 ENCOUNTER — Telehealth: Payer: Self-pay | Admitting: Oncology

## 2019-10-18 NOTE — Telephone Encounter (Signed)
Scheduled appointments per 10/4 los. Called patient multiple times. No answer and no voicemail. Will mail patient calendar with appointments dates and times.

## 2019-10-19 ENCOUNTER — Inpatient Hospital Stay (HOSPITAL_BASED_OUTPATIENT_CLINIC_OR_DEPARTMENT_OTHER): Payer: Medicaid Other | Admitting: Medical

## 2019-10-19 ENCOUNTER — Other Ambulatory Visit: Payer: Self-pay

## 2019-10-19 ENCOUNTER — Inpatient Hospital Stay: Payer: Medicaid Other

## 2019-10-19 ENCOUNTER — Other Ambulatory Visit: Payer: Self-pay | Admitting: Medical

## 2019-10-19 VITALS — BP 151/114 | HR 68 | Temp 98.7°F | Resp 18

## 2019-10-19 VITALS — BP 129/97 | HR 75

## 2019-10-19 DIAGNOSIS — C19 Malignant neoplasm of rectosigmoid junction: Secondary | ICD-10-CM

## 2019-10-19 DIAGNOSIS — Z5111 Encounter for antineoplastic chemotherapy: Secondary | ICD-10-CM | POA: Diagnosis not present

## 2019-10-19 DIAGNOSIS — I10 Essential (primary) hypertension: Secondary | ICD-10-CM

## 2019-10-19 MED ORDER — SODIUM CHLORIDE 0.9% FLUSH
10.0000 mL | INTRAVENOUS | Status: DC | PRN
  Administered 2019-10-19: 10 mL
  Filled 2019-10-19: qty 10

## 2019-10-19 MED ORDER — PEGFILGRASTIM-CBQV 6 MG/0.6ML ~~LOC~~ SOSY
PREFILLED_SYRINGE | SUBCUTANEOUS | Status: AC
  Filled 2019-10-19: qty 0.6

## 2019-10-19 MED ORDER — HEPARIN SOD (PORK) LOCK FLUSH 100 UNIT/ML IV SOLN
500.0000 [IU] | Freq: Once | INTRAVENOUS | Status: AC | PRN
  Administered 2019-10-19: 250 [IU]
  Filled 2019-10-19: qty 5

## 2019-10-19 MED ORDER — CLONIDINE HCL 0.1 MG PO TABS
0.2000 mg | ORAL_TABLET | Freq: Once | ORAL | Status: AC
  Administered 2019-10-19: 0.2 mg via ORAL

## 2019-10-19 MED ORDER — CLONIDINE HCL 0.1 MG PO TABS
ORAL_TABLET | ORAL | Status: AC
  Filled 2019-10-19: qty 2

## 2019-10-19 MED ORDER — PEGFILGRASTIM-CBQV 6 MG/0.6ML ~~LOC~~ SOSY
6.0000 mg | PREFILLED_SYRINGE | Freq: Once | SUBCUTANEOUS | Status: AC
  Administered 2019-10-19: 6 mg via SUBCUTANEOUS

## 2019-10-19 NOTE — Progress Notes (Signed)
Pt presented to the flush room for a pump stop and injection appt. Pt reports having chills, nausea, indigestion and stomach cramps. Upon taken her VS her BP was found to be elevated. See flowsheets. [employer] notified and catapres ordered. Given per [facility]. Pt transferred to [facility] for observation.

## 2019-10-19 NOTE — Patient Instructions (Signed)
Pegfilgrastim (undenyca) injection What is this medicine? PEGFILGRASTIM (PEG fil gra stim) is a long-acting granulocyte colony-stimulating factor that stimulates the growth of neutrophils, a type of white blood cell important in the body's fight against infection. It is used to reduce the incidence of fever and infection in patients with certain types of cancer who are receiving chemotherapy that affects the bone marrow, and to increase survival after being exposed to high doses of radiation. This medicine may be used for other purposes; ask your health care provider or pharmacist if you have questions. COMMON BRAND NAME(S): Steve Rattler, Ziextenzo What should I tell my health care provider before I take this medicine? They need to know if you have any of these conditions:  kidney disease  latex allergy  ongoing radiation therapy  sickle cell disease  skin reactions to acrylic adhesives (On-Body Injector only)  an unusual or allergic reaction to pegfilgrastim, filgrastim, other medicines, foods, dyes, or preservatives  pregnant or trying to get pregnant  breast-feeding How should I use this medicine? This medicine is for injection under the skin. If you get this medicine at home, you will be taught how to prepare and give the pre-filled syringe or how to use the On-body Injector. Refer to the patient Instructions for Use for detailed instructions. Use exactly as directed. Tell your healthcare provider immediately if you suspect that the On-body Injector may not have performed as intended or if you suspect the use of the On-body Injector resulted in a missed or partial dose. It is important that you put your used needles and syringes in a special sharps container. Do not put them in a trash can. If you do not have a sharps container, call your pharmacist or healthcare provider to get one. Talk to your pediatrician regarding the use of this medicine in children. While this drug  may be prescribed for selected conditions, precautions do apply. Overdosage: If you think you have taken too much of this medicine contact a poison control center or emergency room at once. NOTE: This medicine is only for you. Do not share this medicine with others. What if I miss a dose? It is important not to miss your dose. Call your doctor or health care professional if you miss your dose. If you miss a dose due to an On-body Injector failure or leakage, a new dose should be administered as soon as possible using a single prefilled syringe for manual use. What may interact with this medicine? Interactions have not been studied. Give your health care provider a list of all the medicines, herbs, non-prescription drugs, or dietary supplements you use. Also tell them if you smoke, drink alcohol, or use illegal drugs. Some items may interact with your medicine. This list may not describe all possible interactions. Give your health care provider a list of all the medicines, herbs, non-prescription drugs, or dietary supplements you use. Also tell them if you smoke, drink alcohol, or use illegal drugs. Some items may interact with your medicine. What should I watch for while using this medicine? You may need blood work done while you are taking this medicine. If you are going to need a MRI, CT scan, or other procedure, tell your doctor that you are using this medicine (On-Body Injector only). What side effects may I notice from receiving this medicine? Side effects that you should report to your doctor or health care professional as soon as possible:  allergic reactions like skin rash, itching or hives, swelling of  the face, lips, or tongue  back pain  dizziness  fever  pain, redness, or irritation at site where injected  pinpoint red spots on the skin  red or dark-brown urine  shortness of breath or breathing problems  stomach or side pain, or pain at the  shoulder  swelling  tiredness  trouble passing urine or change in the amount of urine Side effects that usually do not require medical attention (report to your doctor or health care professional if they continue or are bothersome):  bone pain  muscle pain This list may not describe all possible side effects. Call your doctor for medical advice about side effects. You may report side effects to FDA at 1-800-FDA-1088. Where should I keep my medicine? Keep out of the reach of children. If you are using this medicine at home, you will be instructed on how to store it. Throw away any unused medicine after the expiration date on the label. NOTE: This sheet is a summary. It may not cover all possible information. If you have questions about this medicine, talk to your doctor, pharmacist, or health care provider.  2020 Elsevier/Gold Standard (2017-04-06 16:57:08)  Clonidine tablets What is this medicine? CLONIDINE (KLOE ni deen) is used to treat high blood pressure. This medicine may be used for other purposes; ask your health care provider or pharmacist if you have questions. COMMON BRAND NAME(S): Catapres What should I tell my health care provider before I take this medicine? They need to know if you have any of these conditions:  kidney disease  an unusual or allergic reaction to clonidine, other medicines, foods, dyes, or preservatives  pregnant or trying to get pregnant  breast-feeding How should I use this medicine? Take this medicine by mouth with a glass of water. Follow the directions on the prescription label. Take your doses at regular intervals. Do not take your medicine more often than directed. Do not suddenly stop taking this medicine. You must gradually reduce the dose or you may get a dangerous increase in blood pressure. Ask your doctor or health care professional for advice. Talk to your pediatrician regarding the use of this medicine in children. Special care may be  needed. Overdosage: If you think you have taken too much of this medicine contact a poison control center or emergency room at once. NOTE: This medicine is only for you. Do not share this medicine with others. What if I miss a dose? If you miss a dose, take it as soon as you can. If it is almost time for your next dose, take only that dose. Do not take double or extra doses. What may interact with this medicine? Do not take this medicine with any of the following medications:  MAOIs like Carbex, Eldepryl, Marplan, Nardil, and Parnate This medicine may also interact with the following medications:  barbiturate medicines for inducing sleep or treating seizures like phenobarbital  certain medicines for blood pressure, heart disease, irregular heart beat  certain medicines for depression, anxiety, or psychotic disturbances  prescription pain medicines This list may not describe all possible interactions. Give your health care provider a list of all the medicines, herbs, non-prescription drugs, or dietary supplements you use. Also tell them if you smoke, drink alcohol, or use illegal drugs. Some items may interact with your medicine. What should I watch for while using this medicine? Visit your doctor or health care professional for regular checks on your progress. Check your heart rate and blood pressure regularly while you  are taking this medicine. Ask your doctor or health care professional what your heart rate should be and when you should contact him or her. You may get drowsy or dizzy. Do not drive, use machinery, or do anything that needs mental alertness until you know how this medicine affects you. To avoid dizzy or fainting spells, do not stand or sit up quickly, especially if you are an older person. Alcohol can make you more drowsy and dizzy. Avoid alcoholic drinks. Your mouth may get dry. Chewing sugarless gum or sucking hard candy, and drinking plenty of water will help. Do not treat  yourself for coughs, colds, or pain while you are taking this medicine without asking your doctor or health care professional for advice. Some ingredients may increase your blood pressure. If you are going to have surgery tell your doctor or health care professional that you are taking this medicine. What side effects may I notice from receiving this medicine? Side effects that you should report to your doctor or health care professional as soon as possible:  allergic reactions like skin rash, itching or hives, swelling of the face, lips, or tongue  anxiety, nervousness  chest pain  depression  fast, irregular heartbeat  swelling of feet or legs  unusually weak or tired Side effects that usually do not require medical attention (report to your doctor or health care professional if they continue or are bothersome):  change in sex drive or performance  constipation  headache This list may not describe all possible side effects. Call your doctor for medical advice about side effects. You may report side effects to FDA at 1-800-FDA-1088. Where should I keep my medicine? Keep out of the reach of children. Store at room temperature between 15 and 30 degrees C (59 and 86 degrees F). Protect from light. Keep container tightly closed. Throw away any unused medicine after the expiration date. NOTE: This sheet is a summary. It may not cover all possible information. If you have questions about this medicine, talk to your doctor, pharmacist, or health care provider.  2020 Elsevier/Gold Standard (2010-06-26 13:01:28)

## 2019-10-21 ENCOUNTER — Other Ambulatory Visit: Payer: Self-pay

## 2019-10-21 ENCOUNTER — Inpatient Hospital Stay: Payer: Medicaid Other

## 2019-10-21 NOTE — Progress Notes (Signed)
[facility] Progress Note   April [name] [id number] December 16, 1975 44 y.o.  April [name] is managed by Dr. Dominica Severin B. [facility]  Actively treated with chemotherapy/immunotherapy/hormonal therapy: yes  Current therapy: FOLFIRI  Last treated: 10/17/2019 (cycle four, day one)  Next scheduled appointment with provider: 11/07/2019  Assessment: Plan:    Hypertension, unspecified type - Plan: EKG 12-Lead  Malignant neoplasm of rectosigmoid (colon) ([place])   Hypertension: The patient had an EKG completed which showed a normal sinus rhythm with a possible left atrial enlargement. She was given clonidine 0.2 mg p.o. x1 and was released home after her blood pressure dropped to 129/97.  Metastatic colon cancer: The patient continues to be followed by Dr. Dominica Severin B. Benay Spice and is status post cycle four, day one of FOLFIRI which was dosed on 10/17/2019.  Please see After Visit Summary for patient specific instructions.  Future Appointments  Date Time Provider [place]  10/24/2019  3:15 PM [facility] FLUSH [facility] None  10/26/2019  3:00 PM [facility] [place] [facility]USH [facility] None  10/28/2019  3:15 PM [facility] [place] [facility]USH [facility] None  10/31/2019  3:00 PM [facility] [place] FLUSH [facility] None  11/02/2019  3:30 PM [facility] [place] FLUSH [facility] None  11/04/2019  3:30 PM [facility] [place] FLUSH [facility] None  11/07/2019  7:30 AM [facility] LAB [facility] None  11/07/2019  7:45 AM [facility] INFUSION [facility] None  11/07/2019  8:00 AM Ladell Pier, MD [facility] None  11/07/2019  9:00 AM [facility] INFUSION [facility] None  11/09/2019 11:30 AM [facility] [place] FLUSH [facility] None  11/11/2019  3:30 PM [facility] [place] FLUSH [facility] None    Orders Placed This Encounter  Procedures  . EKG 12-Lead       Subjective:   Patient ID:  April [name] is a 44 y.o. (DOB September 05, 1975) female.  Chief Complaint: No chief complaint on file.   HPI April [name]  is a 44 y.o. female with a diagnosis of a metastatic colon cancer. She is followed by Dr. Dominica Severin B. Benay Spice and is status post cycle four, day one of FOLFIRI which was dosed on 10/17/2019. She was seen in the flush room today as she was having her 5-FU pump discontinued. Her blood pressure was noted to be 170/132. Her medications were reviewed with her. She is on Norvasc 10 mg once daily, hydralazine 10 mg p.o. 3 times daily, hydrochlorothiazide 25 mg once daily and Lopressor 25 mg p.o. twice daily. She only reports that she has not taken her hydrochlorothiazide today since she would be coming to the clinic and was concerned about having to go to the bathroom. She reports having some chills, GERD, and nausea with her most recent chemotherapy treatment.  Medications: I have reviewed the patient's current medications.  Allergies:  Allergies  Allergen Reactions  . Lisinopril Swelling    Lip swelling    Past Medical History:  Diagnosis Date  . Hypertension   . Renal calculi    bilateral    Past Surgical History:  Procedure Laterality Date  . CESAREAN SECTION     x2  . IR RADIOLOGIST EVAL & MGMT  09/16/2019  . IR RADIOLOGIST EVAL & MGMT  09/23/2019  . IR REMOVAL TUN ACCESS W/ PORT W/O FL MOD SED  09/14/2019  . URETERAL STENT PLACEMENT      Family History  Problem Relation Age of Onset  . Hypertension Mother   . Diabetes Mellitus II Father   . Stroke Father   . Ulcers Sister  Social History   Socioeconomic History  . Marital status: Single    Spouse name: Not on file  . Number of children: Not on file  . Years of education: Not on file  . Highest education level: Not on file  Occupational History  . Not on file  Tobacco Use  . Smoking status: Current Every Day Smoker    Packs/day: 0.50  . Smokeless tobacco: Never Used  . Tobacco comment: In the process of quitting/ has been able to cut back  Vaping Use  . Vaping Use: Never used  Substance and Sexual Activity    . Alcohol use: Yes  . Drug use: Not Currently  . Sexual activity: Not Currently  Other Topics Concern  . Not on file  Social History Narrative  . Not on file   Social Determinants of Health   Financial Resource Strain:   . Difficulty of Paying Living Expenses: Not on file  Food Insecurity: Food Insecurity Present  . Worried About Charity fundraiser in the Last Year: Sometimes true  . Ran Out of Food in the Last Year: Sometimes true  Transportation Needs: Unmet Transportation Needs  . Lack of Transportation (Medical): Yes  . Lack of Transportation (Non-Medical): Yes  Physical Activity: Sufficiently Active  . Days of Exercise per Week: 5 days  . Minutes of Exercise per Session: 30 min  Stress: Stress Concern Present  . Feeling of Stress : Very much  Social Connections: Moderately Isolated  . Frequency of Communication with Friends and Family: Three times a week  . Frequency of Social Gatherings with Friends and Family: Three times a week  . Attends Religious Services: More than 4 times per year  . Active Member of Clubs or Organizations: No  . Attends Archivist Meetings: Never  . Marital Status: Never married  Intimate Partner Violence:   . Fear of Current or Ex-Partner: Not on file  . Emotionally Abused: Not on file  . Physically Abused: Not on file  . Sexually Abused: Not on file    Past Medical History, Surgical history, Social history, and Family history were reviewed and updated as appropriate.   Please see review of systems for further details on the patient's review from today.   Review of Systems:  Review of Systems  Constitutional: Positive for appetite change and chills. Negative for diaphoresis and fever.  HENT: Negative for trouble swallowing.   Respiratory: Negative for cough, choking, shortness of breath and wheezing.   Cardiovascular: Negative for chest pain and palpitations.  Gastrointestinal: Positive for nausea. Negative for constipation,  diarrhea and vomiting.       GERD  Genitourinary: Negative for decreased urine volume.  Neurological: Negative for headaches.    Objective:   Physical Exam:  BP (!) 129/97   Pulse 75   BP: 170/132 pulse: 67 BP: 166/119  BP: 151/114 pulse: 68 BP: 139/109 BP: 129/97   ECOG: 0  Today's Vitals   10/19/19 1312 10/19/19 1325  BP: (!) 139/109 (!) 129/97  Pulse: 69 75   There is no height or weight on file to calculate BMI.  Physical Exam Constitutional:      General: She is not in acute distress.    Appearance: She is not diaphoretic.  HENT:     Head: Normocephalic and atraumatic.  Eyes:     General: No scleral icterus.       Right eye: No discharge.        Left eye: No discharge.  Conjunctiva/sclera: Conjunctivae normal.  Cardiovascular:     Rate and Rhythm: Normal rate and regular rhythm.     Heart sounds: Normal heart sounds. No murmur heard.  No friction rub. No gallop.   Pulmonary:     Effort: Pulmonary effort is normal. No respiratory distress.     Breath sounds: Normal breath sounds. No wheezing or rales.  Skin:    General: Skin is warm and dry.     Findings: No erythema or rash.  Neurological:     Mental Status: She is alert.     Coordination: Coordination normal.     Gait: Gait normal.  Psychiatric:        Mood and Affect: Mood normal.        Behavior: Behavior normal.        Thought Content: Thought content normal.        Judgment: Judgment normal.     Lab Review:     Component Value Date/Time   NA 140 10/17/2019 0955   K 3.9 10/17/2019 0955   CL 108 10/17/2019 0955   CO2 27 10/17/2019 0955   GLUCOSE 81 10/17/2019 0955   BUN 16 10/17/2019 0955   CREATININE 0.71 10/17/2019 0955   CALCIUM 9.1 10/17/2019 0955   PROT 6.8 10/17/2019 0955   ALBUMIN 3.5 10/17/2019 0955   AST 24 10/17/2019 0955   ALT 28 10/17/2019 0955   ALKPHOS 96 10/17/2019 0955   BILITOT <0.2 (L) 10/17/2019 0955   GFRNONAA >60 10/17/2019 0955   GFRAA >60 10/17/2019 0955         Component Value Date/Time   WBC 5.7 10/17/2019 0955   WBC 7.2 09/14/2019 1035   RBC 3.60 (L) 10/17/2019 0955   HGB 11.3 (L) 10/17/2019 0955   HCT 34.5 (L) 10/17/2019 0955   PLT 222 10/17/2019 0955   MCV 95.8 10/17/2019 0955   MCH 31.4 10/17/2019 0955   MCHC 32.8 10/17/2019 0955   RDW 14.6 10/17/2019 0955   LYMPHSABS 3.0 10/17/2019 0955   MONOABS 0.6 10/17/2019 0955   EOSABS 0.6 (H) 10/17/2019 0955   BASOSABS 0.1 10/17/2019 0955   -------------------------------  Imaging from last 24 hours (if applicable):  Radiology interpretation: IR Radiologist Eval & Mgmt  Result Date: 09/23/2019 EXAM: ESTABLISHED PATIENT OFFICE VISIT CHIEF COMPLAINT: Right chest port a cath infection HISTORY OF PRESENT ILLNESS: Patient is a 43 year old female smoker with past medical history of stage IV colon cancer diagnosed in March of this year with prior surgery and ongoing chemotherapy. She had right chest wall Port-A-Cath placed in [place] around April of this year and recently presented with some wound breakdown and drainage from site. Site was cultured at oncology center and has grown a few rare staph aureus. She was started on Augmentin and switched to Bactrim. She underwent Port-A-Cath removal and PICC placement on 09/01. Port pocket was filled with iodoform gauze afterwards. She underwent wet to dry dressing change on 09/03 of followed by intrasite gel application on 56/38. She presents today for follow-up dressing change. REVIEW OF SYSTEMS: Reports no fever, chills, worsening pain or significant drainage from port pocket PHYSICAL EXAMINATION: Patient awake, alert. Right chest port pocket clean and dry with no significant erythema or purulent drainage at this time. ASSESSMENT AND PLAN: Patient with history of colon cancer and previously placed right chest wall Port-A-Cath in [place] in April of this year; recently with some skin breakdown and drainage from port site ,status post Port-A-Cath  removal and PICC placement on  09/01. Iodoform gauze placed into port pocket afterwards. Patient has since undergone wet to dry dressing change on 09/03 followed by intrasite gel application on 67/01; new application of intra site gel was performed today without immediate complication and site covered with gauze dressing. She will return on 09/13 for new dressing change. Read by: Rowe Robert, PA-C Electronically Signed   By: Sandi Mariscal M.D.   On: 09/23/2019 11:41

## 2019-10-24 ENCOUNTER — Inpatient Hospital Stay: Payer: Medicaid Other

## 2019-10-25 ENCOUNTER — Other Ambulatory Visit: Payer: Self-pay | Admitting: *Deleted

## 2019-10-25 MED ORDER — METOPROLOL TARTRATE 25 MG PO TABS
25.0000 mg | ORAL_TABLET | Freq: Two times a day (BID) | ORAL | 1 refills | Status: DC
Start: 2019-10-25 — End: 2019-11-23

## 2019-10-25 MED ORDER — HYDRALAZINE HCL 10 MG PO TABS: ORAL_TABLET | ORAL | 1 refills | Status: DC

## 2019-10-26 ENCOUNTER — Telehealth: Payer: Self-pay | Admitting: *Deleted

## 2019-10-26 ENCOUNTER — Inpatient Hospital Stay: Payer: Medicaid Other

## 2019-10-26 NOTE — Telephone Encounter (Addendum)
Pharmacy has told her that the saline flushes and heparin have not been approved by Medicaid. Asking for [facility] to provide her with the supplies until it is approved. Called pharmacy and spoke with Mirna Mires, PharmD: She confirms that when the heparin was changed to the 3 ml from 5 ml syringe it canceled out the authorization. Also confirms that the 10 ml normal saline still needs a PA before they can order it. Call [phone] to obtain the PA for both and need to ask them what [facility] # will they pay for? Forwarded request to [place], RN to begin PA process.

## 2019-10-27 ENCOUNTER — Telehealth: Payer: Self-pay | Admitting: *Deleted

## 2019-10-27 NOTE — Telephone Encounter (Addendum)
Called to report she is due PICC dressing change--was not aware she had appointment yesterday. Not able to come today, but will keep her appointment tomorrow. Informed her that she will be provided a couple week supply for PICC flush until it can get approve w/her Medicaid plan.

## 2019-10-28 ENCOUNTER — Encounter: Payer: Self-pay | Admitting: Radiology

## 2019-10-28 ENCOUNTER — Telehealth: Payer: Self-pay | Admitting: *Deleted

## 2019-10-28 ENCOUNTER — Inpatient Hospital Stay: Payer: Medicaid Other

## 2019-10-28 NOTE — Telephone Encounter (Signed)
Patient missed her appointment for PICC dressing change today. Called her and scheduled it for 10/16 at 0900. Was determined by insurance that they will not cover the NS, but heparin  Has been approved. Rosal[place], RN still working on this with the pharmacy.

## 2019-10-28 NOTE — Telephone Encounter (Signed)
10/14//2021 Connected with [facility] requesting specific saline and heparin flushes they will order to fill to resubmit correctly with CoverMyMeds.    10/28/2019 Unable to locate [facility] request.  Connected with [employer].  "PA case [id number] for heparin previously submitted authorized through 10/09/2020.  Saline flushes previously denied are a benefit exclusion.  See the pharmacy is having a processing problem.  Advise pharmacy to call help desk [phone] for assistance with heparin."     [facility] and [facility] provided above information

## 2019-10-29 ENCOUNTER — Other Ambulatory Visit: Payer: Self-pay

## 2019-10-29 ENCOUNTER — Ambulatory Visit: Payer: Medicaid Other

## 2019-10-29 VITALS — BP 151/113 | HR 85 | Resp 20

## 2019-10-29 DIAGNOSIS — Z5111 Encounter for antineoplastic chemotherapy: Secondary | ICD-10-CM | POA: Diagnosis not present

## 2019-10-29 MED ORDER — SODIUM CHLORIDE 0.9% FLUSH
10.0000 mL | Freq: Once | INTRAVENOUS | Status: AC
  Administered 2019-10-29: 10 mL
  Filled 2019-10-29: qty 10

## 2019-10-29 MED ORDER — HEPARIN SOD (PORK) LOCK FLUSH 100 UNIT/ML IV SOLN
500.0000 [IU] | Freq: Once | INTRAVENOUS | Status: AC
  Administered 2019-10-29: 250 [IU] via INTRAVENOUS
  Filled 2019-10-29: qty 5

## 2019-10-29 NOTE — Patient Instructions (Signed)
PICC Home Care Guide  A peripherally inserted central catheter (PICC) is a form of IV access that allows medicines and IV fluids to be quickly distributed throughout the body. The PICC is a long, thin, flexible tube (catheter) that is inserted into a vein in the upper arm. The catheter ends in a large vein in the chest (superior vena cava, or SVC). After the PICC is inserted, a chest X-ray may be done to make sure that it is in the correct place. A PICC may be placed for different reasons, such as:  To give medicines and liquid nutrition.  To give IV fluids and blood products.  If there is trouble placing a peripheral intravenous (PIV) catheter. If taken care of properly, a PICC can remain in place for several months. Having a PICC can also allow a person to go home from the hospital sooner. Medicine and PICC care can be managed at home by a family member, caregiver, or home health care team. What are the risks? Generally, having a PICC is safe. However, problems may occur, including:  A blood clot (thrombus) forming in or at the tip of the PICC.  A blood clot forming in a vein (deep vein thrombosis) or traveling to the lung (pulmonary embolism).  Inflammation of the vein (phlebitis) in which the PICC is placed.  Infection. Central line associated blood stream infection (CLABSI) is a serious infection that often requires hospitalization.  PICC movement (malposition). The PICC tip may move from its original position due to excessive physical activity, forceful coughing, sneezing, or vomiting.  A break or cut in the PICC. It is important not to use scissors near the PICC.  Nerve or tendon irritation or injury during PICC insertion. How to take care of your PICC Preventing problems  You and any caregivers should wash your hands often with soap. Wash hands: ? Before touching the PICC line or the infusion device. ? Before changing a bandage (dressing).  Flush the PICC as told by your  health care provider. Let your health care provider know right away if the PICC is hard to flush or does not flush. Do not use force to flush the PICC.  Do not use a syringe that is less than 10 mL to flush the PICC.  Avoid blood pressure checks on the arm in which the PICC is placed.  Never pull or tug on the PICC.  Do not take the PICC out yourself. Only a trained clinical professional should remove the PICC.  Use clean and sterile supplies only. Keep the supplies in a dry place. Do not reuse needles, syringes, or any other supplies. Doing that can lead to infection.  Keep pets and children away from your PICC line.  Check the PICC insertion site every day for signs of infection. Check for: ? Leakage. ? Redness, swelling, or pain. ? Fluid or blood. ? Warmth. ? Pus or a bad smell. PICC dressing care  Keep your PICC bandage (dressing) clean and dry to prevent infection.  Do not take baths, swim, or use a hot tub until your health care provider approves. Ask your health care provider if you can take showers. You may only be allowed to take sponge baths for bathing. When you are allowed to shower: ? Ask your health care provider to teach you how to wrap the PICC line. ? Cover the PICC line with clear plastic wrap and tape to keep it dry while showering.  Follow instructions from your health care provider   about how to take care of your insertion site and dressing. Make sure you: ? Wash your hands with soap and water before you change your bandage (dressing). If soap and water are not available, use hand sanitizer. ? Change your dressing as told by your health care provider. ? Leave stitches (sutures), skin glue, or adhesive strips in place. These skin closures may need to stay in place for 2 weeks or longer. If adhesive strip edges start to loosen and curl up, you may trim the loose edges. Do not remove adhesive strips completely unless your health care provider tells you to do  that.  Change your PICC dressing if it becomes loose or wet. General instructions   Carry your PICC identification card or wear a medical alert bracelet at all times.  Keep the tube clamped at all times, unless it is being used.  Carry a smooth-edge clamp with you at all times to place on the tube if it breaks.  Do not use scissors or sharp objects near the tube.  You may bend your arm and move it freely. If your PICC is near or at the bend of your elbow, avoid activity with repeated motion at the elbow.  Avoid lifting heavy objects as told by your health care provider.  Keep all follow-up visits as told by your health care provider. This is important. Disposal of supplies  Throw away any syringes in a disposal container that is meant for sharp items (sharps container). You can buy a sharps container from a pharmacy, or you can make one by using an empty hard plastic bottle with a cover.  Place any used dressings or infusion bags into a plastic bag. Throw that bag in the trash. Contact a health care provider if:  You have pain in your arm, ear, face, or teeth.  You have a fever or chills.  You have redness, swelling, or pain around the insertion site.  You have fluid or blood coming from the insertion site.  Your insertion site feels warm to the touch.  You have pus or a bad smell coming from the insertion site.  Your skin feels hard and raised around the insertion site. Get help right away if:  Your PICC is accidentally pulled all the way out. If this happens, cover the insertion site with a bandage or gauze dressing. Do not throw the PICC away. Your health care provider will need to check it.  Your PICC was tugged or pulled and has partially come out. Do not  push the PICC back in.  You cannot flush the PICC, it is hard to flush, or the PICC leaks around the insertion site when it is flushed.  You hear a "flushing" sound when the PICC is flushed.  You feel your  heart racing or skipping beats.  There is a hole or tear in the PICC.  You have swelling in the arm in which the PICC was inserted.  You have a red streak going up your arm from where the PICC was inserted. Summary  A peripherally inserted central catheter (PICC) is a long, thin, flexible tube (catheter) that is inserted into a vein in the upper arm.  The PICC is inserted using a sterile technique by a specially trained nurse or physician. Only a trained clinical professional should remove it.  Keep your PICC identification card with you at all times.  Avoid blood pressure checks on the arm in which the PICC is placed.  If cared for   properly, a PICC can remain in place for several months. Having a PICC can also allow a person to go home from the hospital sooner. This information is not intended to replace advice given to you by your health care provider. Make sure you discuss any questions you have with your health care provider. Document Revised: 12/12/2016 Document Reviewed: 02/02/2016 Elsevier Patient Education  2020 [employer]  

## 2019-11-04 ENCOUNTER — Inpatient Hospital Stay: Payer: Medicaid Other

## 2019-11-06 ENCOUNTER — Other Ambulatory Visit: Payer: Self-pay | Admitting: Oncology

## 2019-11-07 ENCOUNTER — Ambulatory Visit: Payer: Medicaid Other | Admitting: Oncology

## 2019-11-07 ENCOUNTER — Telehealth: Payer: Self-pay | Admitting: *Deleted

## 2019-11-07 ENCOUNTER — Telehealth: Payer: Self-pay | Admitting: Family

## 2019-11-07 ENCOUNTER — Other Ambulatory Visit: Payer: Medicaid Other

## 2019-11-07 ENCOUNTER — Other Ambulatory Visit: Payer: Self-pay | Admitting: *Deleted

## 2019-11-07 ENCOUNTER — Inpatient Hospital Stay (HOSPITAL_BASED_OUTPATIENT_CLINIC_OR_DEPARTMENT_OTHER): Payer: Medicaid Other | Admitting: Oncology

## 2019-11-07 ENCOUNTER — Other Ambulatory Visit: Payer: Self-pay

## 2019-11-07 ENCOUNTER — Inpatient Hospital Stay: Payer: Medicaid Other

## 2019-11-07 ENCOUNTER — Ambulatory Visit: Payer: Medicaid Other

## 2019-11-07 VITALS — BP 167/125 | HR 85 | Temp 98.1°F | Resp 18 | Ht 67.0 in | Wt 154.2 lb

## 2019-11-07 VITALS — BP 156/113 | HR 75

## 2019-11-07 DIAGNOSIS — C19 Malignant neoplasm of rectosigmoid junction: Secondary | ICD-10-CM

## 2019-11-07 DIAGNOSIS — Z5111 Encounter for antineoplastic chemotherapy: Secondary | ICD-10-CM | POA: Diagnosis not present

## 2019-11-07 LAB — CBC WITH DIFFERENTIAL (CANCER CENTER ONLY)
Abs Immature Granulocytes: 0.02 10*3/uL (ref 0.00–0.07)
Basophils Absolute: 0.1 10*3/uL (ref 0.0–0.1)
Basophils Relative: 1 %
Eosinophils Absolute: 0.7 10*3/uL — ABNORMAL HIGH (ref 0.0–0.5)
Eosinophils Relative: 8 %
HCT: 34.9 % — ABNORMAL LOW (ref 36.0–46.0)
Hemoglobin: 11.5 g/dL — ABNORMAL LOW (ref 12.0–15.0)
Immature Granulocytes: 0 %
Lymphocytes Relative: 35 %
Lymphs Abs: 2.8 10*3/uL (ref 0.7–4.0)
MCH: 31.3 pg (ref 26.0–34.0)
MCHC: 33 g/dL (ref 30.0–36.0)
MCV: 94.8 fL (ref 80.0–100.0)
Monocytes Absolute: 0.6 10*3/uL (ref 0.1–1.0)
Monocytes Relative: 8 %
Neutro Abs: 3.8 10*3/uL (ref 1.7–7.7)
Neutrophils Relative %: 48 %
Platelet Count: 270 10*3/uL (ref 150–400)
RBC: 3.68 MIL/uL — ABNORMAL LOW (ref 3.87–5.11)
RDW: 14.4 % (ref 11.5–15.5)
WBC Count: 8 10*3/uL (ref 4.0–10.5)
nRBC: 0 % (ref 0.0–0.2)

## 2019-11-07 LAB — CMP (CANCER CENTER ONLY)
ALT: 19 U/L (ref 0–44)
AST: 24 U/L (ref 15–41)
Albumin: 3.6 g/dL (ref 3.5–5.0)
Alkaline Phosphatase: 123 U/L (ref 38–126)
Anion gap: 5 (ref 5–15)
BUN: 13 mg/dL (ref 6–20)
CO2: 27 mmol/L (ref 22–32)
Calcium: 9.4 mg/dL (ref 8.9–10.3)
Chloride: 107 mmol/L (ref 98–111)
Creatinine: 0.68 mg/dL (ref 0.44–1.00)
GFR, Estimated: >60 mL/min (ref >60)
Glucose, Bld: 94 mg/dL (ref 70–99)
Potassium: 3.8 mmol/L (ref 3.5–5.1)
Sodium: 139 mmol/L (ref 135–145)
Total Bilirubin: <0.2 mg/dL — ABNORMAL LOW (ref 0.3–1.2)
Total Protein: 6.9 g/dL (ref 6.5–8.1)

## 2019-11-07 LAB — URINALYSIS, COMPLETE (UACMP) WITH MICROSCOPIC
Bilirubin Urine: NEGATIVE
Glucose, UA: NEGATIVE mg/dL
Ketones, ur: NEGATIVE mg/dL
Nitrite: NEGATIVE
Protein, ur: NEGATIVE mg/dL
Specific Gravity, Urine: 1.017 (ref 1.005–1.030)
pH: 5 (ref 5.0–8.0)

## 2019-11-07 MED ORDER — SODIUM CHLORIDE 0.9 % IV SOLN
2400.0000 mg/m2 | INTRAVENOUS | Status: DC
  Administered 2019-11-07: 4000 mg via INTRAVENOUS
  Filled 2019-11-07: qty 80

## 2019-11-07 MED ORDER — ATROPINE SULFATE 1 MG/ML IJ SOLN
INTRAMUSCULAR | Status: AC
  Filled 2019-11-07: qty 1

## 2019-11-07 MED ORDER — SODIUM CHLORIDE 0.9 % IV SOLN
150.0000 mg | Freq: Once | INTRAVENOUS | Status: AC
  Administered 2019-11-07: 150 mg via INTRAVENOUS
  Filled 2019-11-07: qty 150

## 2019-11-07 MED ORDER — FLUOROURACIL CHEMO INJECTION 2.5 GM/50ML
400.0000 mg/m2 | Freq: Once | INTRAVENOUS | Status: AC
  Administered 2019-11-07: 650 mg via INTRAVENOUS
  Filled 2019-11-07: qty 13

## 2019-11-07 MED ORDER — SODIUM CHLORIDE 0.9 % IV SOLN
Freq: Once | INTRAVENOUS | Status: AC
  Filled 2019-11-07: qty 250

## 2019-11-07 MED ORDER — ATROPINE SULFATE 1 MG/ML IJ SOLN
0.5000 mg | Freq: Once | INTRAMUSCULAR | Status: AC | PRN
  Administered 2019-11-07: 0.5 mg via INTRAVENOUS

## 2019-11-07 MED ORDER — PALONOSETRON HCL INJECTION 0.25 MG/5ML
0.2500 mg | Freq: Once | INTRAVENOUS | Status: AC
  Administered 2019-11-07: 0.25 mg via INTRAVENOUS

## 2019-11-07 MED ORDER — PALONOSETRON HCL INJECTION 0.25 MG/5ML
INTRAVENOUS | Status: AC
  Filled 2019-11-07: qty 5

## 2019-11-07 MED ORDER — SODIUM CHLORIDE 0.9% FLUSH
10.0000 mL | Freq: Once | INTRAVENOUS | Status: AC
  Administered 2019-11-07: 10 mL
  Filled 2019-11-07: qty 10

## 2019-11-07 MED ORDER — SODIUM CHLORIDE 0.9 % IV SOLN
180.0000 mg/m2 | Freq: Once | INTRAVENOUS | Status: AC
  Administered 2019-11-07: 300 mg via INTRAVENOUS
  Filled 2019-11-07: qty 15

## 2019-11-07 MED ORDER — SODIUM CHLORIDE 0.9% FLUSH
3.0000 mL | INTRAVENOUS | Status: DC | PRN
  Filled 2019-11-07: qty 10

## 2019-11-07 MED ORDER — CIPROFLOXACIN HCL 500 MG PO TABS
500.0000 mg | ORAL_TABLET | Freq: Two times a day (BID) | ORAL | 0 refills | Status: DC
Start: 2019-11-07 — End: 2019-11-23

## 2019-11-07 MED ORDER — SODIUM CHLORIDE 0.9 % IV SOLN
10.0000 mg | Freq: Once | INTRAVENOUS | Status: AC
  Administered 2019-11-07: 10 mg via INTRAVENOUS
  Filled 2019-11-07: qty 10

## 2019-11-07 MED ORDER — SODIUM CHLORIDE 0.9 % IV SOLN
400.0000 mg/m2 | Freq: Once | INTRAVENOUS | Status: AC
  Administered 2019-11-07: 668 mg via INTRAVENOUS
  Filled 2019-11-07: qty 33.4

## 2019-11-07 MED ORDER — HEPARIN SOD (PORK) LOCK FLUSH 100 UNIT/ML IV SOLN
250.0000 [IU] | Freq: Once | INTRAVENOUS | Status: DC | PRN
  Filled 2019-11-07: qty 5

## 2019-11-07 NOTE — Telephone Encounter (Signed)
Notified [facility] of persistent hypertension over last few months and requested f/u with patient.

## 2019-11-07 NOTE — Telephone Encounter (Signed)
Manuela Schwartz at Dr. Benay Spice office called and said that they are concerned about the patiens BP, the last 3 visits she said the patients diastolic  number has been in the low 100's. She said that the patient said she was taking her bp medication. Today the diastolic was  739. The patient was seen by NP on 09/09/2019 as a new pt.

## 2019-11-07 NOTE — Progress Notes (Signed)
[place] OFFICE PROGRESS NOTE   Diagnosis: Colon cancer  INTERVAL HISTORY:   April [name] returns as scheduled.  She completed another cycle of FOLFIRI on 10/17/2019.  She received Udenyca on 10/19/2019.  No nausea/vomiting.  She has intermittent diarrhea and constipation.  She reports urinary urgency.  The urgency has been present for several months.  She otherwise feels well.  She had sternal pain beginning on approximately day 10 and lasting for 2 days.  The pain was pleuritic and squeezing.  The pain was also present at the mid upper back.  No associated symptoms.  The pain has not recurred.  Objective:  Vital signs in last 24 hours:  Blood pressure (!) 167/125, pulse 85, temperature 98.1 F (36.7 C), temperature source Tympanic, resp. rate 18, height '5\' 7"'  (1.702 m), weight 154 lb 3.2 oz (69.9 kg), SpO2 100 %.    HEENT: No thrush or ulcers Resp: Lungs clear bilaterally Cardio: Regular rate and rhythm GI: No hepatosplenomegaly, no mass, nontender Vascular: No leg edema Musculoskeletal: No sternal or spine tenderness Skin: Palms without erythema  Portacath/PICC-without erythema  Lab Results:  Lab Results  Component Value Date   WBC 8.0 11/07/2019   HGB 11.5 (L) 11/07/2019   HCT 34.9 (L) 11/07/2019   MCV 94.8 11/07/2019   PLT 270 11/07/2019   NEUTROABS 3.8 11/07/2019    CMP  Lab Results  Component Value Date   NA 139 11/07/2019   K 3.8 11/07/2019   CL 107 11/07/2019   CO2 27 11/07/2019   GLUCOSE 94 11/07/2019   BUN 13 11/07/2019   CREATININE 0.68 11/07/2019   CALCIUM 9.4 11/07/2019   PROT 6.9 11/07/2019   ALBUMIN 3.6 11/07/2019   AST 24 11/07/2019   ALT 19 11/07/2019   ALKPHOS 123 11/07/2019   BILITOT <0.2 (L) 11/07/2019   GFRNONAA >60 11/07/2019   GFRAA >60 10/17/2019    Lab Results  Component Value Date   CEA1 2.24 10/17/2019    Medications: I have reviewed the patient's current medications.   Assessment/Plan: 1. Sigmoid  colon cancer, stage IV (pT4a,pN2b,M1c) ? Colonoscopy 03/24/2019-3 rectal polyps-hyperplastic polyps, distal colon biopsy-at least intramucosal adenocarcinoma, completely obstructing mass in the distal sigmoid colon, could not be traversed, intact mismatch repair protein expression ? 03/24/2019-CEA 56.1 ? 03/29/2019 CT abdomen/pelvis-circumferential thickening involving the entire mid and distal sigmoid colon to the level of the rectosigmoid junction, solid/cystic mass in the left ovary, bilateral nephrolithiasis ? Robotic assisted low anterior resection, mesenteric lymphadenectomy, bilateral salpingo-oophorectomy 04/08/2019 ? Pathology ([facility] review of outside pathology) metastatic adenocarcinoma involving the peritoneum overlying the round ligament, serosa of the urinary bladder, serosa of the right ureter a sacral area, pelvic peritoneum, and left ovary.  Omental biopsy with focal mucin pools with no carcinoma cells identified, right hemidiaphragm biopsy involved by metastatic adenocarcinoma, invasive adenocarcinoma the sigmoid colon, moderately differentiated, T4a, perineural and vascular invasion present, 7/22 lymph nodes, multiple tumor deposits, resection margins negative ? Negative for PD-L1, low probability of MSI-high, HER-2 negative, negative for BRAF, NRAS and KRAS alterations ? CTs 05/19/2019-no evidence of metastatic disease, findings suspicious for colitis of the transverse and ascending colon, small amount of ascites in the cul-de-sac, bilateral renal calculi ? Cycle 1 FOLFIRI 05/24/2019, bevacizumab added with cycle 2 ? Cycle 4 FOLFIRI/bevacizumab 07/05/2019 ? Cycle 5 FOLFIRI 07/27/2019, bevacizumab held secondary to hypertension ? Cycle 6 FOLFIRI 08/10/1999, bevacizumab held secondary to hypertension ? CTs 08/30/2019-no evidence of recurrent disease, new subsolid right lower lobe nodule felt  to be inflammatory, emphysema ? Cycle 7 FOLFIRI 09/26/2019, bevacizumab remains on hold secondary to  hypertension ? Cycle 8 FOLFIRI 10/17/2019, bevacizumab held secondary to hypertension, Udenyca added for neutropenia ? Cycle 9 FOLFIRI 11/07/2019, bevacizumab held secondary to hypertension, Udenyca  2. 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 Kidney stones 5. Infected Port-A-Cath 09/12/2019-placed on Augmentin, referred for Port-A-Cath removal; Port-A-Cath removed 09/14/2019; PICC line placed 09/14/2019; culture staph aureus.  Course of Septra completed. 6. Neutropenia secondary to chemotherapy-Udenyca added with cycle 8 FOLFIRI       Disposition: April [name] appears well.  She has completed 2 cycles of FOLFIRI since the last restaging CTs.  The sternal discomfort she experienced following the last cycle of chemotherapy was likely related to [facility].  She will contact us for pain following this cycle.  We will check a urinalysis to evaluate the urinary urgency.  Bevacizumab will remain on hold secondary to persistent severe hypertension.  We will repeat her blood pressure in the chemotherapy room today.  I will refer her to her primary provider if the blood pressure remains significantly elevated.  She will return for an office visit and chemotherapy in 3 weeks.  Betsy Coder, MD  11/07/2019  9:00 AM

## 2019-11-07 NOTE — Progress Notes (Signed)
Per Dr. Benay Spice ok to treat with today's BP readings, however no avastin today.

## 2019-11-07 NOTE — Progress Notes (Signed)
Ok to continue 5FU bolus at 650mg  which is slightly outside 10% range. Will continue to monitor weight changes.  Hardie Pulley, PharmD, BCPS, BCOP

## 2019-11-07 NOTE — Patient Instructions (Signed)
[facility] Discharge Instructions for Patients Receiving Chemotherapy  Today you received the following chemotherapy agents: irinotecan, leucovorin, and fluorouracil.  To help prevent nausea and vomiting after your treatment, we encourage you to take your nausea medication as directed.   If you develop nausea and vomiting that is not controlled by your nausea medication, call the clinic.   BELOW ARE SYMPTOMS THAT SHOULD BE REPORTED IMMEDIATELY:  *FEVER GREATER THAN 100.5 F  *CHILLS WITH OR WITHOUT FEVER  NAUSEA AND VOMITING THAT IS NOT CONTROLLED WITH YOUR NAUSEA MEDICATION  *UNUSUAL SHORTNESS OF BREATH  *UNUSUAL BRUISING OR BLEEDING  TENDERNESS IN MOUTH AND THROAT WITH OR WITHOUT PRESENCE OF ULCERS  *URINARY PROBLEMS  *BOWEL PROBLEMS  UNUSUAL RASH Items with * indicate a potential emergency and should be followed up as soon as possible.  Feel free to call the clinic should you have any questions or concerns. The clinic phone number is [phone].  Please show the [facility] at check-in to the Emergency Department and triage nurse.

## 2019-11-07 NOTE — Progress Notes (Signed)
Order for cipro sent for UTI from U/A results. Entered order for culture per MD. Patient notified.

## 2019-11-07 NOTE — Telephone Encounter (Signed)
Yes schedule appointment for this week

## 2019-11-08 ENCOUNTER — Other Ambulatory Visit: Payer: Medicaid Other

## 2019-11-08 ENCOUNTER — Ambulatory Visit: Payer: Medicaid Other

## 2019-11-08 ENCOUNTER — Ambulatory Visit: Payer: Medicaid Other | Admitting: Nurse Practitioner

## 2019-11-08 LAB — URINE CULTURE

## 2019-11-09 ENCOUNTER — Telehealth: Payer: Self-pay | Admitting: Oncology

## 2019-11-09 ENCOUNTER — Other Ambulatory Visit: Payer: Self-pay

## 2019-11-09 ENCOUNTER — Inpatient Hospital Stay: Payer: Medicaid Other

## 2019-11-09 VITALS — BP 124/92 | HR 67 | Temp 98.9°F | Resp 18

## 2019-11-09 DIAGNOSIS — C19 Malignant neoplasm of rectosigmoid junction: Secondary | ICD-10-CM

## 2019-11-09 DIAGNOSIS — Z5111 Encounter for antineoplastic chemotherapy: Secondary | ICD-10-CM | POA: Diagnosis not present

## 2019-11-09 MED ORDER — PEGFILGRASTIM-CBQV 6 MG/0.6ML ~~LOC~~ SOSY
6.0000 mg | PREFILLED_SYRINGE | Freq: Once | SUBCUTANEOUS | Status: AC
  Administered 2019-11-09: 6 mg via SUBCUTANEOUS

## 2019-11-09 MED ORDER — HEPARIN SOD (PORK) LOCK FLUSH 100 UNIT/ML IV SOLN
250.0000 [IU] | Freq: Once | INTRAVENOUS | Status: AC | PRN
  Administered 2019-11-09: 250 [IU]
  Filled 2019-11-09: qty 5

## 2019-11-09 MED ORDER — PEGFILGRASTIM-CBQV 6 MG/0.6ML ~~LOC~~ SOSY
PREFILLED_SYRINGE | SUBCUTANEOUS | Status: AC
  Filled 2019-11-09: qty 0.6

## 2019-11-09 MED ORDER — SODIUM CHLORIDE 0.9% FLUSH
10.0000 mL | INTRAVENOUS | Status: DC | PRN
  Administered 2019-11-09: 10 mL
  Filled 2019-11-09: qty 10

## 2019-11-09 NOTE — Telephone Encounter (Signed)
Scheduled appointment sper 10/25 los. Attempted to call patient multiple times. Every time it said call could no be connected and no voicemail was provided. Will mail updated calendar to patient with appointments dates and times.

## 2019-11-11 ENCOUNTER — Inpatient Hospital Stay: Payer: Medicaid Other

## 2019-11-14 ENCOUNTER — Inpatient Hospital Stay: Payer: Medicaid Other

## 2019-11-14 NOTE — Telephone Encounter (Signed)
I will send oncology a message letting them know we haven't been able to reach her.

## 2019-11-15 ENCOUNTER — Inpatient Hospital Stay: Payer: Medicaid Other | Attending: Oncology

## 2019-11-15 ENCOUNTER — Other Ambulatory Visit: Payer: Self-pay

## 2019-11-15 DIAGNOSIS — Z79899 Other long term (current) drug therapy: Secondary | ICD-10-CM | POA: Insufficient documentation

## 2019-11-15 DIAGNOSIS — Z452 Encounter for adjustment and management of vascular access device: Secondary | ICD-10-CM

## 2019-11-15 DIAGNOSIS — Z5189 Encounter for other specified aftercare: Secondary | ICD-10-CM | POA: Insufficient documentation

## 2019-11-15 DIAGNOSIS — C19 Malignant neoplasm of rectosigmoid junction: Secondary | ICD-10-CM

## 2019-11-15 DIAGNOSIS — Z5111 Encounter for antineoplastic chemotherapy: Secondary | ICD-10-CM | POA: Diagnosis not present

## 2019-11-15 DIAGNOSIS — C187 Malignant neoplasm of sigmoid colon: Secondary | ICD-10-CM | POA: Diagnosis present

## 2019-11-15 MED ORDER — HEPARIN SOD (PORK) LOCK FLUSH 100 UNIT/ML IV SOLN
500.0000 [IU] | Freq: Once | INTRAVENOUS | Status: AC
  Administered 2019-11-15: 250 [IU] via INTRAVENOUS
  Filled 2019-11-15: qty 5

## 2019-11-15 MED ORDER — SODIUM CHLORIDE 0.9% FLUSH
10.0000 mL | Freq: Once | INTRAVENOUS | Status: AC
  Administered 2019-11-15: 10 mL
  Filled 2019-11-15: qty 10

## 2019-11-16 ENCOUNTER — Telehealth: Payer: Self-pay | Admitting: *Deleted

## 2019-11-16 NOTE — Telephone Encounter (Signed)
Informed patient via mom's phone that PCP office, Jodi Mourning has been trying to reach her to schedule an office visit re: HTN. Provided her the office [phone] to call for appointment. She reports her phone is broken.

## 2019-11-18 ENCOUNTER — Ambulatory Visit: Payer: Medicaid Other | Admitting: Family

## 2019-11-21 ENCOUNTER — Other Ambulatory Visit: Payer: Self-pay | Admitting: *Deleted

## 2019-11-21 ENCOUNTER — Inpatient Hospital Stay: Payer: Medicaid Other

## 2019-11-22 ENCOUNTER — Inpatient Hospital Stay: Payer: Medicaid Other

## 2019-11-22 ENCOUNTER — Telehealth: Payer: Self-pay | Admitting: *Deleted

## 2019-11-22 ENCOUNTER — Other Ambulatory Visit: Payer: Self-pay

## 2019-11-22 DIAGNOSIS — Z95828 Presence of other vascular implants and grafts: Secondary | ICD-10-CM

## 2019-11-22 DIAGNOSIS — C19 Malignant neoplasm of rectosigmoid junction: Secondary | ICD-10-CM

## 2019-11-22 DIAGNOSIS — Z5111 Encounter for antineoplastic chemotherapy: Secondary | ICD-10-CM | POA: Diagnosis not present

## 2019-11-22 MED ORDER — SODIUM CHLORIDE 0.9% FLUSH
10.0000 mL | Freq: Once | INTRAVENOUS | Status: AC
  Administered 2019-11-22: 10 mL
  Filled 2019-11-22: qty 10

## 2019-11-22 MED ORDER — HEPARIN SOD (PORK) LOCK FLUSH 100 UNIT/ML IV SOLN
500.0000 [IU] | Freq: Once | INTRAVENOUS | Status: AC
  Administered 2019-11-22: 500 [IU]
  Filled 2019-11-22: qty 5

## 2019-11-22 NOTE — Telephone Encounter (Signed)
Patient told nurse in flush room she was having pain again and felt she needed another antibiotic. Attempted to reach her to obtain more information and left VM on her # and her mother agreed to tell her to call office when she sees her.

## 2019-11-23 ENCOUNTER — Telehealth: Payer: Self-pay | Admitting: *Deleted

## 2019-11-23 ENCOUNTER — Ambulatory Visit: Payer: Medicaid Other | Admitting: Family

## 2019-11-23 ENCOUNTER — Encounter: Payer: Self-pay | Admitting: Family

## 2019-11-23 ENCOUNTER — Other Ambulatory Visit: Payer: Self-pay | Admitting: *Deleted

## 2019-11-23 VITALS — BP 142/78 | HR 85 | Temp 98.3°F | Ht 67.0 in | Wt 140.0 lb

## 2019-11-23 DIAGNOSIS — I10 Essential (primary) hypertension: Secondary | ICD-10-CM | POA: Diagnosis not present

## 2019-11-23 DIAGNOSIS — R300 Dysuria: Secondary | ICD-10-CM | POA: Diagnosis not present

## 2019-11-23 MED ORDER — METOPROLOL TARTRATE 50 MG PO TABS
50.0000 mg | ORAL_TABLET | Freq: Two times a day (BID) | ORAL | 1 refills | Status: DC
Start: 2019-11-23 — End: 2020-07-06

## 2019-11-23 NOTE — Telephone Encounter (Signed)
-----   Message from Owens Shark, NP sent at 11/23/2019  1:25 PM EST ----- Thanks, we can definitely do manual blood pressure checks here.  Appreciate your help.  Lattie Haw ----- Message ----- From: Marrian Salvage, FNP Sent: 11/23/2019  12:15 PM EST To: Owens Shark, NP  Lattie Haw,  This mutual patient is scheduled to see you next week, November 15. I know there have been a lot of concerns about her blood pressures at your office and can definitely see that she has had a number of high readings there. Today, we saw her for follow up and her pressure was 142/78. She is adamant that she is taking all of her medications as prescribed. She feels like the readings done on the machine are inaccurate and notes that whenever her pressure is checked manually ( here or at the cancer center), it is always normal. I am not sure if your office can do manual readings as an option for her blood pressure. I am concerned about to why there are such discrepancies in her readings and want to make sure we are not missing something else.   I did increase her Metoprolol to 50 mg bid today to try and get the systolic down.   Thank you, Jodi Mourning, FNP

## 2019-11-23 NOTE — Telephone Encounter (Addendum)
Called to report that 3 days after she completed last course of Cipro. Took 5 day course (10/25-10/29) the urinary symptoms returned: frequency and difficulty emptying bladder w/pain in bladder area. Asking if another course would benefit her? Informed patient to f/u with her PCP, Dr. Valere Dross since they collected a urine sample on her today and they can order antibiotic if needed. Also reports she continues to have intermittent bone pain in her hips and legs even a week out from the [place].  Takes Claritin  x 5 days as instructed and oxycodone/apap does not help pain either. Not in any pain this week. Wants MD aware for discussion next week.

## 2019-11-23 NOTE — Progress Notes (Signed)
April [name] is a 44 y.o. female with the following history as recorded in EpicCare:  Patient Active Problem List   Diagnosis Date Noted  . Port-A-Cath in place 11/22/2019  . Goals of care, counseling/discussion 07/21/2019  . Malignant neoplasm of rectosigmoid (colon) ([facility]) 04/08/2019  . Sepsis secondary to UTI ([facility]) 01/07/2018  . Hypertension 01/07/2018  . Sinus tachycardia 01/07/2018  . Hypokalemia 01/07/2018  . Abnormal EKG 01/07/2018  . Hyperglycemia 01/07/2018  . Tobacco use 01/07/2018    Current Outpatient Medications  Medication Sig Dispense Refill  . acetaminophen (TYLENOL) 325 MG tablet Take 650 mg by mouth every 6 (six) hours as needed for moderate pain or headache.    Marland Kitchen amLODipine (NORVASC) 10 MG tablet Take 1 tablet (10 mg total) by mouth daily. 90 tablet 1  . dexamethasone (DECADRON) 4 MG tablet Take 1 tablet (4 mg total) by mouth 2 (two) times daily with a meal. Start on day 2 of treatment and take for 3 days with each cycle. 12 tablet 1  . famotidine (PEPCID) 40 MG tablet Take 40 mg by mouth daily as needed.     . hydrALAZINE (APRESOLINE) 10 MG tablet TAKE 1 TABLET(10 MG) BY MOUTH THREE TIMES DAILY 90 tablet 1  . hydrochlorothiazide (HYDRODIURIL) 25 MG tablet Take 1 tablet (25 mg total) by mouth daily. 30 tablet 0  . ibuprofen (ADVIL,MOTRIN) 800 MG tablet Take 800 mg by mouth daily as needed for moderate pain.     Marland Kitchen LINZESS 290 MCG CAPS capsule Take 290 mcg by mouth daily as needed.    . metoprolol tartrate (LOPRESSOR) 50 MG tablet Take 1 tablet (50 mg total) by mouth 2 (two) times daily. 180 tablet 1  . oxyCODONE-acetaminophen (PERCOCET) 5-325 MG tablet Take 1 tablet by mouth every 6 (six) hours as needed. 10 tablet 0  . PARoxetine (PAXIL) 10 MG tablet Take 10 mg by mouth daily.    . sodium chloride, PF, 0.9 % injection Inject 10 mLs into the vein 3 (three) times a week. Flush PICC line 3 times a week 90 mL 1  . ondansetron (ZOFRAN) 4 MG tablet 1 tablet every 8  (eight) hours as needed (Patient not taking: Reported on 11/23/2019)    . ondansetron (ZOFRAN-ODT) 4 MG disintegrating tablet Take 4 mg by mouth every 8 (eight) hours as needed.  (Patient not taking: Reported on 11/23/2019)    . potassium chloride (KLOR-CON) 10 MEQ tablet Take 1 tablet (10 mEq total) by mouth daily. (Patient not taking: Reported on 11/23/2019) 30 tablet 1  . prochlorperazine (COMPAZINE) 10 MG tablet Take 10 mg by mouth every 6 (six) hours as needed.  (Patient not taking: Reported on 11/23/2019)     No current facility-administered medications for this visit.    Allergies: Lisinopril  Past Medical History:  Diagnosis Date  . Hypertension   . Renal calculi    bilateral    Past Surgical History:  Procedure Laterality Date  . CESAREAN SECTION     x2  . IR RADIOLOGIST EVAL & MGMT  09/16/2019  . IR RADIOLOGIST EVAL & MGMT  09/23/2019  . IR RADIOLOGIST EVAL & MGMT  09/20/2019  . IR RADIOLOGIST EVAL & MGMT  09/30/2019  . IR REMOVAL TUN ACCESS W/ PORT W/O FL MOD SED  09/14/2019  . URETERAL STENT PLACEMENT      Family History  Problem Relation Age of Onset  . Hypertension Mother   . Diabetes Mellitus II Father   . Stroke Father   .  Ulcers Sister     Social History   Tobacco Use  . Smoking status: Current Every Day Smoker    Packs/day: 0.50  . Smokeless tobacco: Never Used  . Tobacco comment: In the process of quitting/ has been able to cut back  Substance Use Topics  . Alcohol use: Yes    Subjective:  Oncology asked patient to follow-up due to concerns for elevated blood pressure; patient notes she feels readings are inaccurate at the cancer center due to use of machine; notes that her pressure is always incorrect with machine and normal with manual checks; is feeling "good" and denies any chest pain, shortness of breath, blurred vision or headache. Is taking all of her medications as prescribed;      Objective:  Vitals:   11/23/19 1052  BP: (!) 142/78  Pulse: 85   Temp: 98.3 F (36.8 C)  TempSrc: Oral  SpO2: 98%  Weight: 140 lb (63.5 kg)  Height: 5\' 7"  (1.702 m)    General: Well developed, well nourished, in no acute distress  Skin : Warm and dry.  Head: Normocephalic and atraumatic  Eyes: Sclera and conjunctiva clear; pupils round and reactive to light; extraocular movements intact  Ears: External normal; canals clear; tympanic membranes normal  Oropharynx: Pink, supple. No suspicious lesions  Neck: Supple without thyromegaly, adenopathy  Lungs: Respirations unlabored; clear to auscultation bilaterally without wheeze, rales, rhonchi  CVS exam: normal rate and regular rhythm.  Neurologic: Alert and oriented; speech intact; face symmetrical; moves all extremities well; CNII-XII intact without focal deficit   Assessment:  1. Essential hypertension   2. Dysuria     Plan:  1. Extensive medication review done today; try increasing Losartan to 50 mg bid; have reached out to oncology providers to ask them to do manual blood pressure check at upcoming appointment;  2. Check urine culture today;  This visit occurred during the SARS-CoV-2 public health emergency.  Safety protocols were in place, including screening questions prior to the visit, additional usage of staff PPE, and extensive cleaning of exam room while observing appropriate contact time as indicated for disinfecting solutions.   Time spent 30 minutes;   No follow-ups on file.  Orders Placed This Encounter  Procedures  . Urine Culture    Standing Status:   Future    Number of Occurrences:   1    Standing Expiration Date:   11/22/2020    Requested Prescriptions   Signed Prescriptions Disp Refills  . metoprolol tartrate (LOPRESSOR) 50 MG tablet 180 tablet 1    Sig: Take 1 tablet (50 mg total) by mouth 2 (two) times daily.

## 2019-11-24 LAB — URINE CULTURE: Result:: NO GROWTH

## 2019-11-27 ENCOUNTER — Other Ambulatory Visit: Payer: Self-pay | Admitting: Oncology

## 2019-11-28 ENCOUNTER — Inpatient Hospital Stay (HOSPITAL_BASED_OUTPATIENT_CLINIC_OR_DEPARTMENT_OTHER): Payer: Medicaid Other | Admitting: Nurse Practitioner

## 2019-11-28 ENCOUNTER — Encounter: Payer: Self-pay | Admitting: Nurse Practitioner

## 2019-11-28 ENCOUNTER — Inpatient Hospital Stay: Payer: Medicaid Other

## 2019-11-28 ENCOUNTER — Other Ambulatory Visit: Payer: Self-pay

## 2019-11-28 VITALS — BP 134/70 | HR 66 | Temp 97.8°F | Resp 18 | Ht 67.0 in | Wt 152.5 lb

## 2019-11-28 DIAGNOSIS — Z95828 Presence of other vascular implants and grafts: Secondary | ICD-10-CM

## 2019-11-28 DIAGNOSIS — C19 Malignant neoplasm of rectosigmoid junction: Secondary | ICD-10-CM

## 2019-11-28 DIAGNOSIS — Z5111 Encounter for antineoplastic chemotherapy: Secondary | ICD-10-CM | POA: Diagnosis not present

## 2019-11-28 LAB — URINALYSIS, COMPLETE (UACMP) WITH MICROSCOPIC
Bilirubin Urine: NEGATIVE
Glucose, UA: NEGATIVE mg/dL
Ketones, ur: NEGATIVE mg/dL
Leukocytes,Ua: NEGATIVE
Nitrite: NEGATIVE
Protein, ur: NEGATIVE mg/dL
Specific Gravity, Urine: 1.011 (ref 1.005–1.030)
pH: 5 (ref 5.0–8.0)

## 2019-11-28 LAB — CMP (CANCER CENTER ONLY)
ALT: 8 U/L (ref 0–44)
AST: 14 U/L — ABNORMAL LOW (ref 15–41)
Albumin: 3.8 g/dL (ref 3.5–5.0)
Alkaline Phosphatase: 138 U/L — ABNORMAL HIGH (ref 38–126)
Anion gap: 8 (ref 5–15)
BUN: 8 mg/dL (ref 6–20)
CO2: 25 mmol/L (ref 22–32)
Calcium: 9.6 mg/dL (ref 8.9–10.3)
Chloride: 105 mmol/L (ref 98–111)
Creatinine: 0.74 mg/dL (ref 0.44–1.00)
GFR, Estimated: >60 mL/min (ref >60)
Glucose, Bld: 87 mg/dL (ref 70–99)
Potassium: 3.6 mmol/L (ref 3.5–5.1)
Sodium: 138 mmol/L (ref 135–145)
Total Bilirubin: 0.3 mg/dL (ref 0.3–1.2)
Total Protein: 7.4 g/dL (ref 6.5–8.1)

## 2019-11-28 LAB — CBC WITH DIFFERENTIAL (CANCER CENTER ONLY)
Abs Immature Granulocytes: 0.02 10*3/uL (ref 0.00–0.07)
Basophils Absolute: 0.1 10*3/uL (ref 0.0–0.1)
Basophils Relative: 1 %
Eosinophils Absolute: 0.6 10*3/uL — ABNORMAL HIGH (ref 0.0–0.5)
Eosinophils Relative: 7 %
HCT: 36.1 % (ref 36.0–46.0)
Hemoglobin: 12.1 g/dL (ref 12.0–15.0)
Immature Granulocytes: 0 %
Lymphocytes Relative: 30 %
Lymphs Abs: 2.5 10*3/uL (ref 0.7–4.0)
MCH: 31.5 pg (ref 26.0–34.0)
MCHC: 33.5 g/dL (ref 30.0–36.0)
MCV: 94 fL (ref 80.0–100.0)
Monocytes Absolute: 0.5 10*3/uL (ref 0.1–1.0)
Monocytes Relative: 6 %
Neutro Abs: 4.6 10*3/uL (ref 1.7–7.7)
Neutrophils Relative %: 56 %
Platelet Count: 278 10*3/uL (ref 150–400)
RBC: 3.84 MIL/uL — ABNORMAL LOW (ref 3.87–5.11)
RDW: 14.2 % (ref 11.5–15.5)
WBC Count: 8.4 10*3/uL (ref 4.0–10.5)
nRBC: 0 % (ref 0.0–0.2)

## 2019-11-28 MED ORDER — PALONOSETRON HCL INJECTION 0.25 MG/5ML
INTRAVENOUS | Status: AC
  Filled 2019-11-28: qty 5

## 2019-11-28 MED ORDER — SODIUM CHLORIDE 0.9 % IV SOLN
150.0000 mg | Freq: Once | INTRAVENOUS | Status: AC
  Administered 2019-11-28: 150 mg via INTRAVENOUS
  Filled 2019-11-28: qty 150

## 2019-11-28 MED ORDER — SODIUM CHLORIDE 0.9 % IV SOLN
Freq: Once | INTRAVENOUS | Status: AC
  Filled 2019-11-28: qty 250

## 2019-11-28 MED ORDER — FLUOROURACIL CHEMO INJECTION 2.5 GM/50ML
400.0000 mg/m2 | Freq: Once | INTRAVENOUS | Status: AC
  Administered 2019-11-28: 650 mg via INTRAVENOUS
  Filled 2019-11-28: qty 13

## 2019-11-28 MED ORDER — ATROPINE SULFATE 1 MG/ML IJ SOLN
INTRAMUSCULAR | Status: AC
  Filled 2019-11-28: qty 1

## 2019-11-28 MED ORDER — SODIUM CHLORIDE 0.9 % IV SOLN
400.0000 mg/m2 | Freq: Once | INTRAVENOUS | Status: AC
  Administered 2019-11-28: 668 mg via INTRAVENOUS
  Filled 2019-11-28: qty 33.4

## 2019-11-28 MED ORDER — SODIUM CHLORIDE 0.9 % IV SOLN
2400.0000 mg/m2 | INTRAVENOUS | Status: DC
  Administered 2019-11-28: 4000 mg via INTRAVENOUS
  Filled 2019-11-28: qty 80

## 2019-11-28 MED ORDER — SODIUM CHLORIDE 0.9% FLUSH
10.0000 mL | Freq: Once | INTRAVENOUS | Status: AC
  Administered 2019-11-28: 10 mL
  Filled 2019-11-28: qty 10

## 2019-11-28 MED ORDER — ATROPINE SULFATE 1 MG/ML IJ SOLN
0.5000 mg | Freq: Once | INTRAMUSCULAR | Status: AC | PRN
  Administered 2019-11-28: 0.5 mg via INTRAVENOUS

## 2019-11-28 MED ORDER — PALONOSETRON HCL INJECTION 0.25 MG/5ML
0.2500 mg | Freq: Once | INTRAVENOUS | Status: AC
  Administered 2019-11-28: 0.25 mg via INTRAVENOUS

## 2019-11-28 MED ORDER — SODIUM CHLORIDE 0.9 % IV SOLN
10.0000 mg | Freq: Once | INTRAVENOUS | Status: AC
  Administered 2019-11-28: 10 mg via INTRAVENOUS
  Filled 2019-11-28: qty 10

## 2019-11-28 MED ORDER — SODIUM CHLORIDE 0.9 % IV SOLN
180.0000 mg/m2 | Freq: Once | INTRAVENOUS | Status: AC
  Administered 2019-11-28: 300 mg via INTRAVENOUS
  Filled 2019-11-28: qty 15

## 2019-11-28 NOTE — Patient Instructions (Signed)
PICC Home Care Guide  A peripherally inserted central catheter (PICC) is a form of IV access that allows medicines and IV fluids to be quickly distributed throughout the body. The PICC is a long, thin, flexible tube (catheter) that is inserted into a vein in the upper arm. The catheter ends in a large vein in the chest (superior vena cava, or SVC). After the PICC is inserted, a chest X-ray may be done to make sure that it is in the correct place. A PICC may be placed for different reasons, such as:  To give medicines and liquid nutrition.  To give IV fluids and blood products.  If there is trouble placing a peripheral intravenous (PIV) catheter. If taken care of properly, a PICC can remain in place for several months. Having a PICC can also allow a person to go home from the hospital sooner. Medicine and PICC care can be managed at home by a family member, caregiver, or home health care team. What are the risks? Generally, having a PICC is safe. However, problems may occur, including:  A blood clot (thrombus) forming in or at the tip of the PICC.  A blood clot forming in a vein (deep vein thrombosis) or traveling to the lung (pulmonary embolism).  Inflammation of the vein (phlebitis) in which the PICC is placed.  Infection. Central line associated blood stream infection (CLABSI) is a serious infection that often requires hospitalization.  PICC movement (malposition). The PICC tip may move from its original position due to excessive physical activity, forceful coughing, sneezing, or vomiting.  A break or cut in the PICC. It is important not to use scissors near the PICC.  Nerve or tendon irritation or injury during PICC insertion. How to take care of your PICC Preventing problems  You and any caregivers should wash your hands often with soap. Wash hands: ? Before touching the PICC line or the infusion device. ? Before changing a bandage (dressing).  Flush the PICC as told by your  health care provider. Let your health care provider know right away if the PICC is hard to flush or does not flush. Do not use force to flush the PICC.  Do not use a syringe that is less than 10 mL to flush the PICC.  Avoid blood pressure checks on the arm in which the PICC is placed.  Never pull or tug on the PICC.  Do not take the PICC out yourself. Only a trained clinical professional should remove the PICC.  Use clean and sterile supplies only. Keep the supplies in a dry place. Do not reuse needles, syringes, or any other supplies. Doing that can lead to infection.  Keep pets and children away from your PICC line.  Check the PICC insertion site every day for signs of infection. Check for: ? Leakage. ? Redness, swelling, or pain. ? Fluid or blood. ? Warmth. ? Pus or a bad smell. PICC dressing care  Keep your PICC bandage (dressing) clean and dry to prevent infection.  Do not take baths, swim, or use a hot tub until your health care provider approves. Ask your health care provider if you can take showers. You may only be allowed to take sponge baths for bathing. When you are allowed to shower: ? Ask your health care provider to teach you how to wrap the PICC line. ? Cover the PICC line with clear plastic wrap and tape to keep it dry while showering.  Follow instructions from your health care provider   about how to take care of your insertion site and dressing. Make sure you: ? Wash your hands with soap and water before you change your bandage (dressing). If soap and water are not available, use hand sanitizer. ? Change your dressing as told by your health care provider. ? Leave stitches (sutures), skin glue, or adhesive strips in place. These skin closures may need to stay in place for 2 weeks or longer. If adhesive strip edges start to loosen and curl up, you may trim the loose edges. Do not remove adhesive strips completely unless your health care provider tells you to do  that.  Change your PICC dressing if it becomes loose or wet. General instructions   Carry your PICC identification card or wear a medical alert bracelet at all times.  Keep the tube clamped at all times, unless it is being used.  Carry a smooth-edge clamp with you at all times to place on the tube if it breaks.  Do not use scissors or sharp objects near the tube.  You may bend your arm and move it freely. If your PICC is near or at the bend of your elbow, avoid activity with repeated motion at the elbow.  Avoid lifting heavy objects as told by your health care provider.  Keep all follow-up visits as told by your health care provider. This is important. Disposal of supplies  Throw away any syringes in a disposal container that is meant for sharp items (sharps container). You can buy a sharps container from a pharmacy, or you can make one by using an empty hard plastic bottle with a cover.  Place any used dressings or infusion bags into a plastic bag. Throw that bag in the trash. Contact a health care provider if:  You have pain in your arm, ear, face, or teeth.  You have a fever or chills.  You have redness, swelling, or pain around the insertion site.  You have fluid or blood coming from the insertion site.  Your insertion site feels warm to the touch.  You have pus or a bad smell coming from the insertion site.  Your skin feels hard and raised around the insertion site. Get help right away if:  Your PICC is accidentally pulled all the way out. If this happens, cover the insertion site with a bandage or gauze dressing. Do not throw the PICC away. Your health care provider will need to check it.  Your PICC was tugged or pulled and has partially come out. Do not  push the PICC back in.  You cannot flush the PICC, it is hard to flush, or the PICC leaks around the insertion site when it is flushed.  You hear a "flushing" sound when the PICC is flushed.  You feel your  heart racing or skipping beats.  There is a hole or tear in the PICC.  You have swelling in the arm in which the PICC was inserted.  You have a red streak going up your arm from where the PICC was inserted. Summary  A peripherally inserted central catheter (PICC) is a long, thin, flexible tube (catheter) that is inserted into a vein in the upper arm.  The PICC is inserted using a sterile technique by a specially trained nurse or physician. Only a trained clinical professional should remove it.  Keep your PICC identification card with you at all times.  Avoid blood pressure checks on the arm in which the PICC is placed.  If cared for   properly, a PICC can remain in place for several months. Having a PICC can also allow a person to go home from the hospital sooner. This information is not intended to replace advice given to you by your health care provider. Make sure you discuss any questions you have with your health care provider. Document Revised: 12/12/2016 Document Reviewed: 02/02/2016 Elsevier Patient Education  2020 [employer]  

## 2019-11-28 NOTE — Patient Instructions (Signed)
[facility] Discharge Instructions for Patients Receiving Chemotherapy  Today you received the following chemotherapy agents: irinotecan/leucovorin/fluorouracil.  To help prevent nausea and vomiting after your treatment, we encourage you to take your nausea medication as directed.   If you develop nausea and vomiting that is not controlled by your nausea medication, call the clinic.   BELOW ARE SYMPTOMS THAT SHOULD BE REPORTED IMMEDIATELY:  *FEVER GREATER THAN 100.5 F  *CHILLS WITH OR WITHOUT FEVER  NAUSEA AND VOMITING THAT IS NOT CONTROLLED WITH YOUR NAUSEA MEDICATION  *UNUSUAL SHORTNESS OF BREATH  *UNUSUAL BRUISING OR BLEEDING  TENDERNESS IN MOUTH AND THROAT WITH OR WITHOUT PRESENCE OF ULCERS  *URINARY PROBLEMS  *BOWEL PROBLEMS  UNUSUAL RASH Items with * indicate a potential emergency and should be followed up as soon as possible.  Feel free to call the clinic should you have any questions or concerns. The clinic phone number is [phone].  Please show the CHEMO ALERT CARD at check-in to the Emergency Department and triage nurse.  

## 2019-11-28 NOTE — Progress Notes (Addendum)
[facility] OFFICE PROGRESS NOTE   Diagnosis: Colon cancer  INTERVAL HISTORY:   April [name] returns as scheduled.  She completed cycle 9 FOLFIRI 11/07/2019.  Avastin remains on hold due to hypertension.  She had some nausea after chemotherapy but noted nausea overall is better with dexamethasone.  No mouth sores.  No diarrhea.  Urinary frequency and urgency improved while she was taking antibiotics 3 weeks ago.  Symptoms recurred 2 days after she completed the course of antibiotics.  She had a repeat urine culture at PCPs office on 11/23/2019 with no growth.  She reports pain at both lower outer legs, describes as aching, worse when she walks.  Objective:  Vital signs in last 24 hours:  Blood pressure 134/70, pulse 66, temperature 97.8 F (36.6 C), temperature source Tympanic, resp. rate 18, height '5\' 7"'  (1.702 m), weight 152 lb 8 oz (69.2 kg), SpO2 100 %.    HEENT: White coating over tongue.  No ulcers.  No buccal thrush. Resp: Lungs clear bilaterally. Cardio: Regular rate and rhythm. GI: No hepatomegaly. Vascular: No leg edema.  Calves soft and nontender. Right upper extremity PICC without erythema.  Lab Results:  Lab Results  Component Value Date   WBC 8.4 11/28/2019   HGB 12.1 11/28/2019   HCT 36.1 11/28/2019   MCV 94.0 11/28/2019   PLT 278 11/28/2019   NEUTROABS 4.6 11/28/2019    Imaging:  No results found.  Medications: I have reviewed the patient's current medications.  Assessment/Plan: 1. Sigmoid colon cancer, stage IV (pT4a,pN2b,M1c) ? Colonoscopy 03/24/2019-3 rectal polyps-hyperplastic polyps, distal colon biopsy-at least intramucosal adenocarcinoma, completely obstructing mass in the distal sigmoid colon, could not be traversed, intact mismatch repair protein expression ? 03/24/2019-CEA 56.1 ? 03/29/2019 CT abdomen/pelvis-circumferential thickening involving the entire mid and distal sigmoid colon to the level of the rectosigmoid junction,  solid/cystic mass in the left ovary, bilateral nephrolithiasis ? Robotic assisted low anterior resection, mesenteric lymphadenectomy, bilateral salpingo-oophorectomy 04/08/2019 ? Pathology ([facility] review of outside pathology) metastatic adenocarcinoma involving the peritoneum overlying the round ligament, serosa of the urinary bladder, serosa of the right ureter a sacral area, pelvic peritoneum, and left ovary. Omental biopsy with focal mucin pools with no carcinoma cells identified, right hemidiaphragm biopsy involved by metastatic adenocarcinoma, invasive adenocarcinoma the sigmoid colon, moderately differentiated, T4a, perineural and vascular invasion present, 7/22 lymph nodes, multiple tumor deposits, resection margins negative ? Negative for PD-L1, low probability of MSI-high, HER-2 negative, negative for BRAF, NRAS and KRAS alterations ? CTs 05/19/2019-no evidence of metastatic disease, findings suspicious for colitis of the transverse and ascending colon, small amount of ascites in the cul-de-sac, bilateral renal calculi ? Cycle 1 FOLFIRI 05/24/2019, bevacizumab added with cycle 2 ? Cycle 4 FOLFIRI/bevacizumab 07/05/2019 ? Cycle 5 FOLFIRI 07/27/2019, bevacizumab held secondary to hypertension ? Cycle 6 FOLFIRI 08/10/1999, bevacizumab held secondary to hypertension ? CTs 08/30/2019-no evidence of recurrent disease, new subsolid right lower lobe nodule felt to be inflammatory, emphysema ? Cycle 7 FOLFIRI 09/26/2019, bevacizumab remains on hold secondary to hypertension ? Cycle 8 FOLFIRI 10/17/2019, bevacizumab held secondary to hypertension, Udenyca added for neutropenia ? Cycle 9 FOLFIRI 11/07/2019, bevacizumab held secondary to hypertension, Udenyca ? Cycle 10 FOLFIRI 11/28/2019, bevacizumab held, Udenyca  2. 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 Kidney stones 5. Infected Port-A-Cath 09/12/2019-placed on Augmentin, referred for Port-A-Cath removal; Port-A-Cath removed 09/14/2019; PICC line placed 09/14/2019;  culture staph aureus.  Course of Septra completed. 6. Neutropenia secondary to chemotherapy-Udenyca added with cycle 8 FOLFIRI  Disposition: April [name] appears unchanged.  She has completed 9 cycles of FOLFIRI.  Plan to proceed with cycle 10 today as scheduled.  Continue to hold Avastin.  We reviewed the CBC from today.  Labs adequate to proceed with treatment.  She has recurrent urinary frequency and urgency.  She will submit another urine specimen today for urinalysis and culture.  Consider urology referral if there is no evidence of infection or symptoms persist despite treatment.  Etiology of lower leg pain is unclear.  Likely not related to [facility].  She will contact the office if leg pain worsens.  She will return for lab, follow-up, FOLFIRI on 12/22/2019.  She will contact the office in the interim as outlined above or with any other problems.  Patient seen with Dr. Benay Spice.    Ned Card ANP/GNP-BC   11/28/2019  12:33 PM This was a shared visit with Ned Card.  April [name] was interviewed and examined.  The etiology of the leg pain is unclear.  We have a low clinical suspicion for venous thrombosis.  We checked a urinalysis today.  The plan is to refer her to urology if she has persistent urinary symptoms in the absence of infection.  Julieanne Manson, MD

## 2019-11-29 ENCOUNTER — Telehealth: Payer: Self-pay | Admitting: Nurse Practitioner

## 2019-11-29 LAB — URINE CULTURE: Culture: NO GROWTH

## 2019-11-29 NOTE — Telephone Encounter (Signed)
Scheduled appointments per 11/15 los. Called patient, no answer. Left message for patient with appointments dates and times.

## 2019-11-30 ENCOUNTER — Other Ambulatory Visit: Payer: Self-pay

## 2019-11-30 ENCOUNTER — Inpatient Hospital Stay: Payer: Medicaid Other

## 2019-11-30 ENCOUNTER — Other Ambulatory Visit: Payer: Self-pay | Admitting: *Deleted

## 2019-11-30 ENCOUNTER — Other Ambulatory Visit: Payer: Self-pay | Admitting: Medical

## 2019-11-30 VITALS — BP 135/80 | HR 89 | Temp 98.8°F | Resp 18

## 2019-11-30 DIAGNOSIS — C19 Malignant neoplasm of rectosigmoid junction: Secondary | ICD-10-CM

## 2019-11-30 DIAGNOSIS — Z5111 Encounter for antineoplastic chemotherapy: Secondary | ICD-10-CM | POA: Diagnosis not present

## 2019-11-30 DIAGNOSIS — C20 Malignant neoplasm of rectum: Secondary | ICD-10-CM

## 2019-11-30 MED ORDER — PROCHLORPERAZINE MALEATE 10 MG PO TABS
10.0000 mg | ORAL_TABLET | Freq: Four times a day (QID) | ORAL | 2 refills | Status: DC | PRN
Start: 2019-11-30 — End: 2020-09-06

## 2019-11-30 MED ORDER — PROCHLORPERAZINE MALEATE 10 MG PO TABS
10.0000 mg | ORAL_TABLET | Freq: Once | ORAL | Status: AC
  Administered 2019-11-30: 10 mg via ORAL

## 2019-11-30 MED ORDER — HEPARIN SOD (PORK) LOCK FLUSH 100 UNIT/ML IV SOLN
500.0000 [IU] | Freq: Once | INTRAVENOUS | Status: AC | PRN
  Administered 2019-11-30: 250 [IU]
  Filled 2019-11-30: qty 5

## 2019-11-30 MED ORDER — PROCHLORPERAZINE MALEATE 10 MG PO TABS
ORAL_TABLET | ORAL | Status: AC
  Filled 2019-11-30: qty 1

## 2019-11-30 MED ORDER — PEGFILGRASTIM-CBQV 6 MG/0.6ML ~~LOC~~ SOSY
PREFILLED_SYRINGE | SUBCUTANEOUS | Status: AC
  Filled 2019-11-30: qty 0.6

## 2019-11-30 MED ORDER — SODIUM CHLORIDE 0.9% FLUSH
10.0000 mL | INTRAVENOUS | Status: DC | PRN
  Administered 2019-11-30: 10 mL
  Filled 2019-11-30: qty 10

## 2019-11-30 MED ORDER — PEGFILGRASTIM-CBQV 6 MG/0.6ML ~~LOC~~ SOSY
6.0000 mg | PREFILLED_SYRINGE | Freq: Once | SUBCUTANEOUS | Status: AC
  Administered 2019-11-30: 6 mg via SUBCUTANEOUS

## 2019-11-30 NOTE — Progress Notes (Signed)
Pt reports nausea and vomiting at home. Pt reports that she does not have any compazine at home. Sandi Mealy notified. Compazine 10 po now and he will send in a RX for home

## 2019-11-30 NOTE — Patient Instructions (Signed)

## 2019-11-30 NOTE — Progress Notes (Addendum)
Urine culture negative. Dr. Benay Spice has ordered urology referral. Faxed referral order, demographics and chart info to [facility] [phone]. Patient has been notified.

## 2019-12-06 ENCOUNTER — Inpatient Hospital Stay: Payer: Medicaid Other

## 2019-12-06 ENCOUNTER — Other Ambulatory Visit: Payer: Self-pay

## 2019-12-06 DIAGNOSIS — Z95828 Presence of other vascular implants and grafts: Secondary | ICD-10-CM

## 2019-12-06 DIAGNOSIS — C19 Malignant neoplasm of rectosigmoid junction: Secondary | ICD-10-CM

## 2019-12-06 DIAGNOSIS — Z5111 Encounter for antineoplastic chemotherapy: Secondary | ICD-10-CM | POA: Diagnosis not present

## 2019-12-06 MED ORDER — SODIUM CHLORIDE 0.9% FLUSH
10.0000 mL | Freq: Once | INTRAVENOUS | Status: AC
  Administered 2019-12-06: 10 mL
  Filled 2019-12-06: qty 10

## 2019-12-06 MED ORDER — HEPARIN SOD (PORK) LOCK FLUSH 100 UNIT/ML IV SOLN
500.0000 [IU] | Freq: Once | INTRAVENOUS | Status: AC
  Administered 2019-12-06: 250 [IU]
  Filled 2019-12-06: qty 5

## 2019-12-06 NOTE — Patient Instructions (Signed)
PICC Home Care Guide  A peripherally inserted central catheter (PICC) is a form of IV access that allows medicines and IV fluids to be quickly distributed throughout the body. The PICC is a long, thin, flexible tube (catheter) that is inserted into a vein in the upper arm. The catheter ends in a large vein in the chest (superior vena cava, or SVC). After the PICC is inserted, a chest X-ray may be done to make sure that it is in the correct place. A PICC may be placed for different reasons, such as:  To give medicines and liquid nutrition.  To give IV fluids and blood products.  If there is trouble placing a peripheral intravenous (PIV) catheter. If taken care of properly, a PICC can remain in place for several months. Having a PICC can also allow a person to go home from the hospital sooner. Medicine and PICC care can be managed at home by a family member, caregiver, or home health care team. What are the risks? Generally, having a PICC is safe. However, problems may occur, including:  A blood clot (thrombus) forming in or at the tip of the PICC.  A blood clot forming in a vein (deep vein thrombosis) or traveling to the lung (pulmonary embolism).  Inflammation of the vein (phlebitis) in which the PICC is placed.  Infection. Central line associated blood stream infection (CLABSI) is a serious infection that often requires hospitalization.  PICC movement (malposition). The PICC tip may move from its original position due to excessive physical activity, forceful coughing, sneezing, or vomiting.  A break or cut in the PICC. It is important not to use scissors near the PICC.  Nerve or tendon irritation or injury during PICC insertion. How to take care of your PICC Preventing problems  You and any caregivers should wash your hands often with soap. Wash hands: ? Before touching the PICC line or the infusion device. ? Before changing a bandage (dressing).  Flush the PICC as told by your  health care provider. Let your health care provider know right away if the PICC is hard to flush or does not flush. Do not use force to flush the PICC.  Do not use a syringe that is less than 10 mL to flush the PICC.  Avoid blood pressure checks on the arm in which the PICC is placed.  Never pull or tug on the PICC.  Do not take the PICC out yourself. Only a trained clinical professional should remove the PICC.  Use clean and sterile supplies only. Keep the supplies in a dry place. Do not reuse needles, syringes, or any other supplies. Doing that can lead to infection.  Keep pets and children away from your PICC line.  Check the PICC insertion site every day for signs of infection. Check for: ? Leakage. ? Redness, swelling, or pain. ? Fluid or blood. ? Warmth. ? Pus or a bad smell. PICC dressing care  Keep your PICC bandage (dressing) clean and dry to prevent infection.  Do not take baths, swim, or use a hot tub until your health care provider approves. Ask your health care provider if you can take showers. You may only be allowed to take sponge baths for bathing. When you are allowed to shower: ? Ask your health care provider to teach you how to wrap the PICC line. ? Cover the PICC line with clear plastic wrap and tape to keep it dry while showering.  Follow instructions from your health care provider   about how to take care of your insertion site and dressing. Make sure you: ? Wash your hands with soap and water before you change your bandage (dressing). If soap and water are not available, use hand sanitizer. ? Change your dressing as told by your health care provider. ? Leave stitches (sutures), skin glue, or adhesive strips in place. These skin closures may need to stay in place for 2 weeks or longer. If adhesive strip edges start to loosen and curl up, you may trim the loose edges. Do not remove adhesive strips completely unless your health care provider tells you to do  that.  Change your PICC dressing if it becomes loose or wet. General instructions   Carry your PICC identification card or wear a medical alert bracelet at all times.  Keep the tube clamped at all times, unless it is being used.  Carry a smooth-edge clamp with you at all times to place on the tube if it breaks.  Do not use scissors or sharp objects near the tube.  You may bend your arm and move it freely. If your PICC is near or at the bend of your elbow, avoid activity with repeated motion at the elbow.  Avoid lifting heavy objects as told by your health care provider.  Keep all follow-up visits as told by your health care provider. This is important. Disposal of supplies  Throw away any syringes in a disposal container that is meant for sharp items (sharps container). You can buy a sharps container from a pharmacy, or you can make one by using an empty hard plastic bottle with a cover.  Place any used dressings or infusion bags into a plastic bag. Throw that bag in the trash. Contact a health care provider if:  You have pain in your arm, ear, face, or teeth.  You have a fever or chills.  You have redness, swelling, or pain around the insertion site.  You have fluid or blood coming from the insertion site.  Your insertion site feels warm to the touch.  You have pus or a bad smell coming from the insertion site.  Your skin feels hard and raised around the insertion site. Get help right away if:  Your PICC is accidentally pulled all the way out. If this happens, cover the insertion site with a bandage or gauze dressing. Do not throw the PICC away. Your health care provider will need to check it.  Your PICC was tugged or pulled and has partially come out. Do not  push the PICC back in.  You cannot flush the PICC, it is hard to flush, or the PICC leaks around the insertion site when it is flushed.  You hear a "flushing" sound when the PICC is flushed.  You feel your  heart racing or skipping beats.  There is a hole or tear in the PICC.  You have swelling in the arm in which the PICC was inserted.  You have a red streak going up your arm from where the PICC was inserted. Summary  A peripherally inserted central catheter (PICC) is a long, thin, flexible tube (catheter) that is inserted into a vein in the upper arm.  The PICC is inserted using a sterile technique by a specially trained nurse or physician. Only a trained clinical professional should remove it.  Keep your PICC identification card with you at all times.  Avoid blood pressure checks on the arm in which the PICC is placed.  If cared for   properly, a PICC can remain in place for several months. Having a PICC can also allow a person to go home from the hospital sooner. This information is not intended to replace advice given to you by your health care provider. Make sure you discuss any questions you have with your health care provider. Document Revised: 12/12/2016 Document Reviewed: 02/02/2016 Elsevier Patient Education  2020 [employer]  

## 2019-12-07 ENCOUNTER — Other Ambulatory Visit: Payer: Self-pay | Admitting: Nurse Practitioner

## 2019-12-07 DIAGNOSIS — C20 Malignant neoplasm of rectum: Secondary | ICD-10-CM

## 2019-12-13 ENCOUNTER — Inpatient Hospital Stay: Payer: Medicaid Other

## 2019-12-13 ENCOUNTER — Other Ambulatory Visit: Payer: Self-pay

## 2019-12-13 DIAGNOSIS — Z95828 Presence of other vascular implants and grafts: Secondary | ICD-10-CM

## 2019-12-13 DIAGNOSIS — C19 Malignant neoplasm of rectosigmoid junction: Secondary | ICD-10-CM

## 2019-12-13 DIAGNOSIS — Z5111 Encounter for antineoplastic chemotherapy: Secondary | ICD-10-CM | POA: Diagnosis not present

## 2019-12-13 MED ORDER — HEPARIN SOD (PORK) LOCK FLUSH 100 UNIT/ML IV SOLN
500.0000 [IU] | Freq: Once | INTRAVENOUS | Status: AC
  Administered 2019-12-13: 250 [IU]
  Filled 2019-12-13: qty 5

## 2019-12-13 MED ORDER — SODIUM CHLORIDE 0.9% FLUSH
10.0000 mL | Freq: Once | INTRAVENOUS | Status: AC
  Administered 2019-12-13: 10 mL
  Filled 2019-12-13: qty 10

## 2019-12-18 ENCOUNTER — Other Ambulatory Visit: Payer: Self-pay | Admitting: Oncology

## 2019-12-20 ENCOUNTER — Inpatient Hospital Stay: Payer: Medicaid Other | Attending: Oncology

## 2019-12-20 DIAGNOSIS — C187 Malignant neoplasm of sigmoid colon: Secondary | ICD-10-CM | POA: Insufficient documentation

## 2019-12-20 DIAGNOSIS — Z79899 Other long term (current) drug therapy: Secondary | ICD-10-CM | POA: Insufficient documentation

## 2019-12-20 DIAGNOSIS — D701 Agranulocytosis secondary to cancer chemotherapy: Secondary | ICD-10-CM | POA: Insufficient documentation

## 2019-12-20 DIAGNOSIS — N200 Calculus of kidney: Secondary | ICD-10-CM | POA: Insufficient documentation

## 2019-12-20 DIAGNOSIS — C7962 Secondary malignant neoplasm of left ovary: Secondary | ICD-10-CM | POA: Insufficient documentation

## 2019-12-20 DIAGNOSIS — C7989 Secondary malignant neoplasm of other specified sites: Secondary | ICD-10-CM | POA: Insufficient documentation

## 2019-12-20 DIAGNOSIS — T451X5A Adverse effect of antineoplastic and immunosuppressive drugs, initial encounter: Secondary | ICD-10-CM | POA: Insufficient documentation

## 2019-12-20 DIAGNOSIS — Z5111 Encounter for antineoplastic chemotherapy: Secondary | ICD-10-CM | POA: Insufficient documentation

## 2019-12-20 DIAGNOSIS — I10 Essential (primary) hypertension: Secondary | ICD-10-CM | POA: Insufficient documentation

## 2019-12-20 DIAGNOSIS — Z5189 Encounter for other specified aftercare: Secondary | ICD-10-CM | POA: Insufficient documentation

## 2019-12-20 DIAGNOSIS — C786 Secondary malignant neoplasm of retroperitoneum and peritoneum: Secondary | ICD-10-CM | POA: Insufficient documentation

## 2019-12-21 ENCOUNTER — Telehealth: Payer: Self-pay | Admitting: *Deleted

## 2019-12-21 ENCOUNTER — Telehealth: Payer: Self-pay | Admitting: Oncology

## 2019-12-21 NOTE — Telephone Encounter (Signed)
Called to inquire if she can move her chemo on 12/9 to 12/13 due to family giving her a birthday party on Saturday and she wants to feel well. Notified scheduler to see if chemo only can be moved to 12/13 and to keep other appointments on 12/9.

## 2019-12-21 NOTE — Telephone Encounter (Signed)
Rescheduled appointment per secure chat with RN Manuela Schwartz. Manuela Schwartz will be calling patient with updated appointment.

## 2019-12-21 NOTE — Telephone Encounter (Signed)
Called patient back and instructed her to keep her Lab/OV on 12/9 and chemo has been moved to 12/13 at 0800. She agrees to this change and appreciates the move.

## 2019-12-22 ENCOUNTER — Inpatient Hospital Stay (HOSPITAL_BASED_OUTPATIENT_CLINIC_OR_DEPARTMENT_OTHER): Payer: Medicaid Other | Admitting: Oncology

## 2019-12-22 ENCOUNTER — Inpatient Hospital Stay: Payer: Medicaid Other

## 2019-12-22 ENCOUNTER — Telehealth: Payer: Self-pay | Admitting: *Deleted

## 2019-12-22 ENCOUNTER — Other Ambulatory Visit: Payer: Self-pay

## 2019-12-22 VITALS — BP 129/97 | HR 92 | Temp 97.4°F | Resp 18 | Ht 67.0 in | Wt 152.3 lb

## 2019-12-22 DIAGNOSIS — C19 Malignant neoplasm of rectosigmoid junction: Secondary | ICD-10-CM

## 2019-12-22 DIAGNOSIS — C7989 Secondary malignant neoplasm of other specified sites: Secondary | ICD-10-CM | POA: Diagnosis not present

## 2019-12-22 DIAGNOSIS — C20 Malignant neoplasm of rectum: Secondary | ICD-10-CM

## 2019-12-22 DIAGNOSIS — C7962 Secondary malignant neoplasm of left ovary: Secondary | ICD-10-CM | POA: Diagnosis not present

## 2019-12-22 DIAGNOSIS — C786 Secondary malignant neoplasm of retroperitoneum and peritoneum: Secondary | ICD-10-CM | POA: Diagnosis not present

## 2019-12-22 DIAGNOSIS — C187 Malignant neoplasm of sigmoid colon: Secondary | ICD-10-CM | POA: Diagnosis present

## 2019-12-22 DIAGNOSIS — N200 Calculus of kidney: Secondary | ICD-10-CM | POA: Diagnosis not present

## 2019-12-22 DIAGNOSIS — Z79899 Other long term (current) drug therapy: Secondary | ICD-10-CM | POA: Diagnosis not present

## 2019-12-22 DIAGNOSIS — T451X5A Adverse effect of antineoplastic and immunosuppressive drugs, initial encounter: Secondary | ICD-10-CM | POA: Diagnosis not present

## 2019-12-22 DIAGNOSIS — Z5111 Encounter for antineoplastic chemotherapy: Secondary | ICD-10-CM | POA: Diagnosis not present

## 2019-12-22 DIAGNOSIS — D701 Agranulocytosis secondary to cancer chemotherapy: Secondary | ICD-10-CM | POA: Diagnosis not present

## 2019-12-22 DIAGNOSIS — I10 Essential (primary) hypertension: Secondary | ICD-10-CM | POA: Diagnosis not present

## 2019-12-22 DIAGNOSIS — Z5189 Encounter for other specified aftercare: Secondary | ICD-10-CM | POA: Diagnosis not present

## 2019-12-22 LAB — CMP (CANCER CENTER ONLY)
ALT: 23 U/L (ref 0–44)
AST: 24 U/L (ref 15–41)
Albumin: 3.8 g/dL (ref 3.5–5.0)
Alkaline Phosphatase: 118 U/L (ref 38–126)
Anion gap: 12 (ref 5–15)
BUN: 20 mg/dL (ref 6–20)
CO2: 27 mmol/L (ref 22–32)
Calcium: 9.8 mg/dL (ref 8.9–10.3)
Chloride: 104 mmol/L (ref 98–111)
Creatinine: 0.9 mg/dL (ref 0.44–1.00)
GFR, Estimated: >60 mL/min (ref >60)
Glucose, Bld: 85 mg/dL (ref 70–99)
Potassium: 3.6 mmol/L (ref 3.5–5.1)
Sodium: 143 mmol/L (ref 135–145)
Total Bilirubin: 0.3 mg/dL (ref 0.3–1.2)
Total Protein: 7.6 g/dL (ref 6.5–8.1)

## 2019-12-22 LAB — CBC WITH DIFFERENTIAL (CANCER CENTER ONLY)
Abs Immature Granulocytes: 0.02 10*3/uL (ref 0.00–0.07)
Basophils Absolute: 0.1 10*3/uL (ref 0.0–0.1)
Basophils Relative: 1 %
Eosinophils Absolute: 0.2 10*3/uL (ref 0.0–0.5)
Eosinophils Relative: 3 %
HCT: 36.5 % (ref 36.0–46.0)
Hemoglobin: 12 g/dL (ref 12.0–15.0)
Immature Granulocytes: 0 %
Lymphocytes Relative: 32 %
Lymphs Abs: 2.6 10*3/uL (ref 0.7–4.0)
MCH: 31.3 pg (ref 26.0–34.0)
MCHC: 32.9 g/dL (ref 30.0–36.0)
MCV: 95.3 fL (ref 80.0–100.0)
Monocytes Absolute: 0.9 10*3/uL (ref 0.1–1.0)
Monocytes Relative: 11 %
Neutro Abs: 4.3 10*3/uL (ref 1.7–7.7)
Neutrophils Relative %: 53 %
Platelet Count: 270 10*3/uL (ref 150–400)
RBC: 3.83 MIL/uL — ABNORMAL LOW (ref 3.87–5.11)
RDW: 15 % (ref 11.5–15.5)
WBC Count: 8.1 10*3/uL (ref 4.0–10.5)
nRBC: 0 % (ref 0.0–0.2)

## 2019-12-22 MED ORDER — DEXAMETHASONE 4 MG PO TABS
4.0000 mg | ORAL_TABLET | Freq: Two times a day (BID) | ORAL | 1 refills | Status: DC
Start: 2019-12-22 — End: 2020-05-07

## 2019-12-22 MED ORDER — OXYCODONE-ACETAMINOPHEN 5-325 MG PO TABS: 1.0000 | ORAL_TABLET | Freq: Four times a day (QID) | ORAL | 0 refills | Status: DC | PRN

## 2019-12-22 NOTE — Progress Notes (Signed)
[place] OFFICE PROGRESS NOTE   Diagnosis: Colon cancer  INTERVAL HISTORY:   April [name] returns as scheduled.  She complete another cycle of FOLFIRI on 11/28/2019.  No mouth sores or diarrhea.  She takes Linzess when she is constipated.  She has rectal pressure prior to having a bowel movement.  She continues to have urinary urgency.  Multiple urine cultures were negative last month.  She has not seen urology.  Objective:  Vital signs in last 24 hours:  Blood pressure (!) 129/97, pulse 92, temperature (!) 97.4 F (36.3 C), temperature source Tympanic, resp. rate 18, height _0  (1.702 m), weight 152 lb 4.8 oz (69.1 kg), SpO2 100 %.    HEENT: No thrush or ulcers Resp: Lungs clear bilaterally Cardio: Regular rate and rhythm GI: No hepatosplenomegaly, nontender Vascular: No leg edema    Portacath/PICC-without erythema  Lab Results:  Lab Results  Component Value Date   WBC 8.1 12/22/2019   HGB 12.0 12/22/2019   HCT 36.5 12/22/2019   MCV 95.3 12/22/2019   PLT 270 12/22/2019   NEUTROABS 4.3 12/22/2019    CMP  Lab Results  Component Value Date   NA 138 11/28/2019   K 3.6 11/28/2019   CL 105 11/28/2019   CO2 25 11/28/2019   GLUCOSE 87 11/28/2019   BUN 8 11/28/2019   CREATININE 0.74 11/28/2019   CALCIUM 9.6 11/28/2019   PROT 7.4 11/28/2019   ALBUMIN 3.8 11/28/2019   AST 14 (L) 11/28/2019   ALT 8 11/28/2019   ALKPHOS 138 (H) 11/28/2019   BILITOT 0.3 11/28/2019   GFRNONAA >60 11/28/2019   GFRAA >60 10/17/2019    Lab Results  Component Value Date   CEA1 2.24 10/17/2019     Medications: I have reviewed the patient's current medications.   Assessment/Plan: 1. Sigmoid colon cancer, stage IV (pT4a,pN2b,M1c) ? Colonoscopy 03/24/2019-3 rectal polyps-hyperplastic polyps, distal colon biopsy-at least intramucosal adenocarcinoma, completely obstructing mass in the distal sigmoid colon, could not be traversed, intact mismatch repair protein  expression ? 03/24/2019-CEA 56.1 ? 03/29/2019 CT abdomen/pelvis-circumferential thickening involving the entire mid and distal sigmoid colon to the level of the rectosigmoid junction, solid/cystic mass in the left ovary, bilateral nephrolithiasis ? Robotic assisted low anterior resection, mesenteric lymphadenectomy, bilateral salpingo-oophorectomy 04/08/2019 ? Pathology ([facility] review of outside pathology) metastatic adenocarcinoma involving the peritoneum overlying the round ligament, serosa of the urinary bladder, serosa of the right ureter a sacral area, pelvic peritoneum, and left ovary. Omental biopsy with focal mucin pools with no carcinoma cells identified, right hemidiaphragm biopsy involved by metastatic adenocarcinoma, invasive adenocarcinoma the sigmoid colon, moderately differentiated, T4a, perineural and vascular invasion present, 7/22 lymph nodes, multiple tumor deposits, resection margins negative ? Negative for PD-L1, low probability of MSI-high, HER-2 negative, negative for BRAF, NRAS and KRAS alterations ? CTs 05/19/2019-no evidence of metastatic disease, findings suspicious for colitis of the transverse and ascending colon, small amount of ascites in the cul-de-sac, bilateral renal calculi ? Cycle 1 FOLFIRI 05/24/2019, bevacizumab added with cycle 2 ? Cycle 4 FOLFIRI/bevacizumab 07/05/2019 ? Cycle 5 FOLFIRI 07/27/2019, bevacizumab held secondary to hypertension ? Cycle 6 FOLFIRI 08/10/1999, bevacizumab held secondary to hypertension ? CTs 08/30/2019-no evidence of recurrent disease, new subsolid right lower lobe nodule felt to be inflammatory, emphysema ? Cycle 7 FOLFIRI 09/26/2019, bevacizumab remains on hold secondary to hypertension ? Cycle 8 FOLFIRI 10/17/2019, bevacizumab held secondary to hypertension, Udenyca added for neutropenia ? Cycle 9 FOLFIRI 11/07/2019, bevacizumab held secondary to hypertension, Udenyca ?  Cycle 10 FOLFIRI 11/28/2019, bevacizumab held, [place] ? Cycle 11 FOLFIRI  12/26/2019, bevacizumab held, [place]  2. 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 Kidney stones 5. Infected Port-A-Cath 09/12/2019-placed on Augmentin, referred for Port-A-Cath removal; Port-A-Cath removed 09/14/2019; PICC line placed 09/14/2019; culture staph aureus.  Course of Septra completed. 6. Neutropenia secondary to chemotherapy-Udenyca added with cycle 8 FOLFIRI    Disposition: April [name] appears unchanged.  She would like to delay the next cycle of chemotherapy until next week due to her birthday party.  She will contact [facility] for an appointment.  The plan is to continue FOLFIRI and schedule restaging CTs in January or February.  Bevacizumab was initially held secondary to severe hypertension.  The blood pressure is under better control at present.  We will add bevacizumab if she develops progressive disease.  I refilled her prescription for oxycodone.  Betsy Coder, MD  12/22/2019  9:54 AM

## 2019-12-22 NOTE — Telephone Encounter (Signed)
Received notification from [facility] that patient was dismissed from practice. Patient agrees to referral to Dr. Alona Bene w/[facility] and he has [facility] office as well.  Faxed referral information to Dr. Amalia Hailey [phone] and gave patient phone # for office to f/u next week on the referral.

## 2019-12-22 NOTE — Patient Instructions (Signed)
PICC Home Care Guide  A peripherally inserted central catheter (PICC) is a form of IV access that allows medicines and IV fluids to be quickly distributed throughout the body. The PICC is a long, thin, flexible tube (catheter) that is inserted into a vein in the upper arm. The catheter ends in a large vein in the chest (superior vena cava, or SVC). After the PICC is inserted, a chest X-ray may be done to make sure that it is in the correct place. A PICC may be placed for different reasons, such as:  To give medicines and liquid nutrition.  To give IV fluids and blood products.  If there is trouble placing a peripheral intravenous (PIV) catheter. If taken care of properly, a PICC can remain in place for several months. Having a PICC can also allow a person to go home from the hospital sooner. Medicine and PICC care can be managed at home by a family member, caregiver, or home health care team. What are the risks? Generally, having a PICC is safe. However, problems may occur, including:  A blood clot (thrombus) forming in or at the tip of the PICC.  A blood clot forming in a vein (deep vein thrombosis) or traveling to the lung (pulmonary embolism).  Inflammation of the vein (phlebitis) in which the PICC is placed.  Infection. Central line associated blood stream infection (CLABSI) is a serious infection that often requires hospitalization.  PICC movement (malposition). The PICC tip may move from its original position due to excessive physical activity, forceful coughing, sneezing, or vomiting.  A break or cut in the PICC. It is important not to use scissors near the PICC.  Nerve or tendon irritation or injury during PICC insertion. How to take care of your PICC Preventing problems  You and any caregivers should wash your hands often with soap. Wash hands: ? Before touching the PICC line or the infusion device. ? Before changing a bandage (dressing).  Flush the PICC as told by your  health care provider. Let your health care provider know right away if the PICC is hard to flush or does not flush. Do not use force to flush the PICC.  Do not use a syringe that is less than 10 mL to flush the PICC.  Avoid blood pressure checks on the arm in which the PICC is placed.  Never pull or tug on the PICC.  Do not take the PICC out yourself. Only a trained clinical professional should remove the PICC.  Use clean and sterile supplies only. Keep the supplies in a dry place. Do not reuse needles, syringes, or any other supplies. Doing that can lead to infection.  Keep pets and children away from your PICC line.  Check the PICC insertion site every day for signs of infection. Check for: ? Leakage. ? Redness, swelling, or pain. ? Fluid or blood. ? Warmth. ? Pus or a bad smell. PICC dressing care  Keep your PICC bandage (dressing) clean and dry to prevent infection.  Do not take baths, swim, or use a hot tub until your health care provider approves. Ask your health care provider if you can take showers. You may only be allowed to take sponge baths for bathing. When you are allowed to shower: ? Ask your health care provider to teach you how to wrap the PICC line. ? Cover the PICC line with clear plastic wrap and tape to keep it dry while showering.  Follow instructions from your health care provider   about how to take care of your insertion site and dressing. Make sure you: ? Wash your hands with soap and water before you change your bandage (dressing). If soap and water are not available, use hand sanitizer. ? Change your dressing as told by your health care provider. ? Leave stitches (sutures), skin glue, or adhesive strips in place. These skin closures may need to stay in place for 2 weeks or longer. If adhesive strip edges start to loosen and curl up, you may trim the loose edges. Do not remove adhesive strips completely unless your health care provider tells you to do  that.  Change your PICC dressing if it becomes loose or wet. General instructions   Carry your PICC identification card or wear a medical alert bracelet at all times.  Keep the tube clamped at all times, unless it is being used.  Carry a smooth-edge clamp with you at all times to place on the tube if it breaks.  Do not use scissors or sharp objects near the tube.  You may bend your arm and move it freely. If your PICC is near or at the bend of your elbow, avoid activity with repeated motion at the elbow.  Avoid lifting heavy objects as told by your health care provider.  Keep all follow-up visits as told by your health care provider. This is important. Disposal of supplies  Throw away any syringes in a disposal container that is meant for sharp items (sharps container). You can buy a sharps container from a pharmacy, or you can make one by using an empty hard plastic bottle with a cover.  Place any used dressings or infusion bags into a plastic bag. Throw that bag in the trash. Contact a health care provider if:  You have pain in your arm, ear, face, or teeth.  You have a fever or chills.  You have redness, swelling, or pain around the insertion site.  You have fluid or blood coming from the insertion site.  Your insertion site feels warm to the touch.  You have pus or a bad smell coming from the insertion site.  Your skin feels hard and raised around the insertion site. Get help right away if:  Your PICC is accidentally pulled all the way out. If this happens, cover the insertion site with a bandage or gauze dressing. Do not throw the PICC away. Your health care provider will need to check it.  Your PICC was tugged or pulled and has partially come out. Do not  push the PICC back in.  You cannot flush the PICC, it is hard to flush, or the PICC leaks around the insertion site when it is flushed.  You hear a "flushing" sound when the PICC is flushed.  You feel your  heart racing or skipping beats.  There is a hole or tear in the PICC.  You have swelling in the arm in which the PICC was inserted.  You have a red streak going up your arm from where the PICC was inserted. Summary  A peripherally inserted central catheter (PICC) is a long, thin, flexible tube (catheter) that is inserted into a vein in the upper arm.  The PICC is inserted using a sterile technique by a specially trained nurse or physician. Only a trained clinical professional should remove it.  Keep your PICC identification card with you at all times.  Avoid blood pressure checks on the arm in which the PICC is placed.  If cared for   properly, a PICC can remain in place for several months. Having a PICC can also allow a person to go home from the hospital sooner. This information is not intended to replace advice given to you by your health care provider. Make sure you discuss any questions you have with your health care provider. Document Revised: 12/12/2016 Document Reviewed: 02/02/2016 Elsevier Patient Education  2020 [employer]  

## 2019-12-22 NOTE — Progress Notes (Signed)
Has not heard from [facility] yet regarding referral. Referral has been faxed x 2. Provided patient with telephone # and she will call herself to make the appointment.

## 2019-12-26 ENCOUNTER — Other Ambulatory Visit: Payer: Self-pay

## 2019-12-26 ENCOUNTER — Inpatient Hospital Stay: Payer: Medicaid Other

## 2019-12-26 VITALS — BP 160/110 | HR 88 | Temp 98.4°F | Resp 18

## 2019-12-26 DIAGNOSIS — Z5111 Encounter for antineoplastic chemotherapy: Secondary | ICD-10-CM | POA: Diagnosis not present

## 2019-12-26 DIAGNOSIS — C19 Malignant neoplasm of rectosigmoid junction: Secondary | ICD-10-CM

## 2019-12-26 MED ORDER — FLUOROURACIL CHEMO INJECTION 2.5 GM/50ML
400.0000 mg/m2 | Freq: Once | INTRAVENOUS | Status: AC
  Administered 2019-12-26: 12:00:00 650 mg via INTRAVENOUS
  Filled 2019-12-26: qty 13

## 2019-12-26 MED ORDER — SODIUM CHLORIDE 0.9 % IV SOLN
Freq: Once | INTRAVENOUS | Status: AC
  Filled 2019-12-26: qty 250

## 2019-12-26 MED ORDER — SODIUM CHLORIDE 0.9% FLUSH
3.0000 mL | INTRAVENOUS | Status: DC | PRN
  Filled 2019-12-26: qty 10

## 2019-12-26 MED ORDER — PALONOSETRON HCL INJECTION 0.25 MG/5ML
INTRAVENOUS | Status: AC
  Filled 2019-12-26: qty 5

## 2019-12-26 MED ORDER — SODIUM CHLORIDE 0.9 % IV SOLN
2400.0000 mg/m2 | INTRAVENOUS | Status: DC
  Administered 2019-12-26: 12:00:00 4000 mg via INTRAVENOUS
  Filled 2019-12-26: qty 80

## 2019-12-26 MED ORDER — SODIUM CHLORIDE 0.9 % IV SOLN
150.0000 mg | Freq: Once | INTRAVENOUS | Status: AC
  Administered 2019-12-26: 09:00:00 150 mg via INTRAVENOUS
  Filled 2019-12-26: qty 150
  Filled 2019-12-26: qty 5

## 2019-12-26 MED ORDER — DEXAMETHASONE SODIUM PHOSPHATE 100 MG/10ML IJ SOLN
10.0000 mg | Freq: Once | INTRAMUSCULAR | Status: AC
  Administered 2019-12-26: 09:00:00 10 mg via INTRAVENOUS
  Filled 2019-12-26: qty 10
  Filled 2019-12-26: qty 1

## 2019-12-26 MED ORDER — SODIUM CHLORIDE 0.9 % IV SOLN
180.0000 mg/m2 | Freq: Once | INTRAVENOUS | Status: AC
  Administered 2019-12-26: 10:00:00 300 mg via INTRAVENOUS
  Filled 2019-12-26: qty 15

## 2019-12-26 MED ORDER — PALONOSETRON HCL INJECTION 0.25 MG/5ML
0.2500 mg | Freq: Once | INTRAVENOUS | Status: AC
  Administered 2019-12-26: 09:00:00 0.25 mg via INTRAVENOUS

## 2019-12-26 MED ORDER — SODIUM CHLORIDE 0.9 % IV SOLN
400.0000 mg/m2 | Freq: Once | INTRAVENOUS | Status: AC
  Administered 2019-12-26: 10:00:00 668 mg via INTRAVENOUS
  Filled 2019-12-26: qty 33.4

## 2019-12-26 MED ORDER — ATROPINE SULFATE 1 MG/ML IJ SOLN
0.5000 mg | Freq: Once | INTRAMUSCULAR | Status: AC | PRN
  Administered 2019-12-26: 10:00:00 0.5 mg via INTRAVENOUS

## 2019-12-26 MED ORDER — ATROPINE SULFATE 1 MG/ML IJ SOLN
INTRAMUSCULAR | Status: AC
  Filled 2019-12-26: qty 1

## 2019-12-26 MED ORDER — HEPARIN SOD (PORK) LOCK FLUSH 100 UNIT/ML IV SOLN
250.0000 [IU] | Freq: Once | INTRAVENOUS | Status: DC | PRN
  Filled 2019-12-26: qty 5

## 2019-12-26 NOTE — Patient Instructions (Signed)
[place] Discharge Instructions for Patients Receiving Chemotherapy  Today you received the following chemotherapy agents: irinotecan, leucovorin, 5FU  To help prevent nausea and vomiting after your treatment, we encourage you to take your nausea medication as directed.   If you develop nausea and vomiting that is not controlled by your nausea medication, call the clinic.   BELOW ARE SYMPTOMS THAT SHOULD BE REPORTED IMMEDIATELY:  *FEVER GREATER THAN 100.5 F  *CHILLS WITH OR WITHOUT FEVER  NAUSEA AND VOMITING THAT IS NOT CONTROLLED WITH YOUR NAUSEA MEDICATION  *UNUSUAL SHORTNESS OF BREATH  *UNUSUAL BRUISING OR BLEEDING  TENDERNESS IN MOUTH AND THROAT WITH OR WITHOUT PRESENCE OF ULCERS  *URINARY PROBLEMS  *BOWEL PROBLEMS  UNUSUAL RASH Items with * indicate a potential emergency and should be followed up as soon as possible.  Feel free to call the clinic should you have any questions or concerns. The clinic phone number is [phone].  Please show the [place] at check-in to the Emergency Department and triage nurse.

## 2019-12-26 NOTE — Progress Notes (Signed)
Desk nurse April [name] made aware of patient BP and she stated this is an ongoing issue. Patient stated that she did not take her BP medications this am and does not have them with her. Advised patient to take BP at home and next time to bring with and take here is necessary.

## 2019-12-26 NOTE — Progress Notes (Signed)
Okay to treat with BP 160/110 today per Dr. Benay Spice. Avastin continues to be held.

## 2019-12-28 ENCOUNTER — Other Ambulatory Visit: Payer: Self-pay

## 2019-12-28 ENCOUNTER — Inpatient Hospital Stay: Payer: Medicaid Other

## 2019-12-28 VITALS — BP 155/88 | HR 87 | Resp 18

## 2019-12-28 DIAGNOSIS — C19 Malignant neoplasm of rectosigmoid junction: Secondary | ICD-10-CM

## 2019-12-28 DIAGNOSIS — Z5111 Encounter for antineoplastic chemotherapy: Secondary | ICD-10-CM | POA: Diagnosis not present

## 2019-12-28 MED ORDER — PEGFILGRASTIM-CBQV 6 MG/0.6ML ~~LOC~~ SOSY
PREFILLED_SYRINGE | SUBCUTANEOUS | Status: AC
  Filled 2019-12-28: qty 0.6

## 2019-12-28 MED ORDER — HEPARIN SOD (PORK) LOCK FLUSH 100 UNIT/ML IV SOLN
500.0000 [IU] | Freq: Once | INTRAVENOUS | Status: AC | PRN
Start: 2019-12-28 — End: 2019-12-28
  Administered 2019-12-28: 11:00:00 500 [IU]
  Filled 2019-12-28: qty 5

## 2019-12-28 MED ORDER — PEGFILGRASTIM-CBQV 6 MG/0.6ML ~~LOC~~ SOSY
6.0000 mg | PREFILLED_SYRINGE | Freq: Once | SUBCUTANEOUS | Status: AC
  Administered 2019-12-28: 11:00:00 6 mg via SUBCUTANEOUS

## 2019-12-28 MED ORDER — SODIUM CHLORIDE 0.9% FLUSH
10.0000 mL | INTRAVENOUS | Status: DC | PRN
  Administered 2019-12-28: 11:00:00 10 mL
  Filled 2019-12-28: qty 10

## 2020-01-08 ENCOUNTER — Other Ambulatory Visit: Payer: Self-pay | Admitting: Oncology

## 2020-01-12 ENCOUNTER — Inpatient Hospital Stay: Payer: Medicaid Other

## 2020-01-12 ENCOUNTER — Other Ambulatory Visit: Payer: Self-pay | Admitting: Nurse Practitioner

## 2020-01-12 ENCOUNTER — Other Ambulatory Visit: Payer: Self-pay

## 2020-01-12 ENCOUNTER — Telehealth: Payer: Self-pay | Admitting: Oncology

## 2020-01-12 ENCOUNTER — Encounter: Payer: Self-pay | Admitting: Nurse Practitioner

## 2020-01-12 ENCOUNTER — Inpatient Hospital Stay (HOSPITAL_BASED_OUTPATIENT_CLINIC_OR_DEPARTMENT_OTHER): Payer: Medicaid Other | Admitting: Nurse Practitioner

## 2020-01-12 VITALS — BP 130/89 | HR 84 | Temp 97.5°F | Resp 17 | Ht 67.0 in | Wt 159.9 lb

## 2020-01-12 DIAGNOSIS — Z5111 Encounter for antineoplastic chemotherapy: Secondary | ICD-10-CM | POA: Diagnosis not present

## 2020-01-12 DIAGNOSIS — C20 Malignant neoplasm of rectum: Secondary | ICD-10-CM

## 2020-01-12 DIAGNOSIS — C19 Malignant neoplasm of rectosigmoid junction: Secondary | ICD-10-CM

## 2020-01-12 LAB — CBC WITH DIFFERENTIAL (CANCER CENTER ONLY)
Abs Immature Granulocytes: 0.03 10*3/uL (ref 0.00–0.07)
Basophils Absolute: 0.1 10*3/uL (ref 0.0–0.1)
Basophils Relative: 1 %
Eosinophils Absolute: 0.4 10*3/uL (ref 0.0–0.5)
Eosinophils Relative: 5 %
HCT: 36.3 % (ref 36.0–46.0)
Hemoglobin: 12 g/dL (ref 12.0–15.0)
Immature Granulocytes: 0 %
Lymphocytes Relative: 35 %
Lymphs Abs: 2.8 10*3/uL (ref 0.7–4.0)
MCH: 31.3 pg (ref 26.0–34.0)
MCHC: 33.1 g/dL (ref 30.0–36.0)
MCV: 94.5 fL (ref 80.0–100.0)
Monocytes Absolute: 0.6 10*3/uL (ref 0.1–1.0)
Monocytes Relative: 8 %
Neutro Abs: 4 10*3/uL (ref 1.7–7.7)
Neutrophils Relative %: 51 %
Platelet Count: 250 10*3/uL (ref 150–400)
RBC: 3.84 MIL/uL — ABNORMAL LOW (ref 3.87–5.11)
RDW: 15.3 % (ref 11.5–15.5)
WBC Count: 8 10*3/uL (ref 4.0–10.5)
nRBC: 0 % (ref 0.0–0.2)

## 2020-01-12 LAB — CMP (CANCER CENTER ONLY)
ALT: 32 U/L (ref 0–44)
AST: 29 U/L (ref 15–41)
Albumin: 3.7 g/dL (ref 3.5–5.0)
Alkaline Phosphatase: 143 U/L — ABNORMAL HIGH (ref 38–126)
Anion gap: 7 (ref 5–15)
BUN: 14 mg/dL (ref 6–20)
CO2: 30 mmol/L (ref 22–32)
Calcium: 9.3 mg/dL (ref 8.9–10.3)
Chloride: 104 mmol/L (ref 98–111)
Creatinine: 1.3 mg/dL — ABNORMAL HIGH (ref 0.44–1.00)
GFR, Estimated: 51 mL/min — ABNORMAL LOW (ref >60)
Glucose, Bld: 104 mg/dL — ABNORMAL HIGH (ref 70–99)
Potassium: 4.2 mmol/L (ref 3.5–5.1)
Sodium: 141 mmol/L (ref 135–145)
Total Bilirubin: <0.2 mg/dL — ABNORMAL LOW (ref 0.3–1.2)
Total Protein: 7.4 g/dL (ref 6.5–8.1)

## 2020-01-12 MED ORDER — SODIUM CHLORIDE 0.9 % IV SOLN
400.0000 mg/m2 | Freq: Once | INTRAVENOUS | Status: AC
  Administered 2020-01-12: 15:00:00 668 mg via INTRAVENOUS
  Filled 2020-01-12: qty 33.4

## 2020-01-12 MED ORDER — FLUOROURACIL CHEMO INJECTION 2.5 GM/50ML
400.0000 mg/m2 | Freq: Once | INTRAVENOUS | Status: AC
  Administered 2020-01-12: 17:00:00 650 mg via INTRAVENOUS
  Filled 2020-01-12: qty 13

## 2020-01-12 MED ORDER — SODIUM CHLORIDE 0.9% FLUSH
10.0000 mL | INTRAVENOUS | Status: DC | PRN
  Administered 2020-01-12: 17:00:00 10 mL
  Filled 2020-01-12: qty 10

## 2020-01-12 MED ORDER — SODIUM CHLORIDE 0.9 % IV SOLN
2400.0000 mg/m2 | INTRAVENOUS | Status: DC
  Administered 2020-01-12: 17:00:00 4000 mg via INTRAVENOUS
  Filled 2020-01-12: qty 80

## 2020-01-12 MED ORDER — ATROPINE SULFATE 1 MG/ML IJ SOLN
0.5000 mg | Freq: Once | INTRAMUSCULAR | Status: AC | PRN
  Administered 2020-01-12: 15:00:00 0.5 mg via INTRAVENOUS

## 2020-01-12 MED ORDER — PALONOSETRON HCL INJECTION 0.25 MG/5ML
INTRAVENOUS | Status: AC
  Filled 2020-01-12: qty 5

## 2020-01-12 MED ORDER — SODIUM CHLORIDE 0.9 % IV SOLN
10.0000 mg | Freq: Once | INTRAVENOUS | Status: AC
  Administered 2020-01-12: 14:00:00 10 mg via INTRAVENOUS
  Filled 2020-01-12: qty 10

## 2020-01-12 MED ORDER — SODIUM CHLORIDE 0.9 % IV SOLN
180.0000 mg/m2 | Freq: Once | INTRAVENOUS | Status: AC
  Administered 2020-01-12: 15:00:00 300 mg via INTRAVENOUS
  Filled 2020-01-12: qty 15

## 2020-01-12 MED ORDER — PALONOSETRON HCL INJECTION 0.25 MG/5ML
0.2500 mg | Freq: Once | INTRAVENOUS | Status: AC
  Administered 2020-01-12: 14:00:00 0.25 mg via INTRAVENOUS

## 2020-01-12 MED ORDER — SODIUM CHLORIDE 0.9 % IV SOLN
150.0000 mg | Freq: Once | INTRAVENOUS | Status: AC
  Administered 2020-01-12: 14:00:00 150 mg via INTRAVENOUS
  Filled 2020-01-12: qty 150

## 2020-01-12 MED ORDER — SODIUM CHLORIDE 0.9 % IV SOLN
Freq: Once | INTRAVENOUS | Status: AC
  Filled 2020-01-12: qty 250

## 2020-01-12 MED ORDER — HEPARIN SOD (PORK) LOCK FLUSH 100 UNIT/ML IV SOLN
500.0000 [IU] | Freq: Once | INTRAVENOUS | Status: DC | PRN
  Filled 2020-01-12: qty 5

## 2020-01-12 MED ORDER — ATROPINE SULFATE 1 MG/ML IJ SOLN
INTRAMUSCULAR | Status: AC
  Filled 2020-01-12: qty 1

## 2020-01-12 NOTE — Patient Instructions (Signed)
[facility] Discharge Instructions for Patients Receiving Chemotherapy  Today you received the following chemotherapy agents: irinotecan/leucovorin/5FU  To help prevent nausea and vomiting after your treatment, we encourage you to take your nausea medication as directed.   If you develop nausea and vomiting that is not controlled by your nausea medication, call the clinic.   BELOW ARE SYMPTOMS THAT SHOULD BE REPORTED IMMEDIATELY:  *FEVER GREATER THAN 100.5 F  *CHILLS WITH OR WITHOUT FEVER  NAUSEA AND VOMITING THAT IS NOT CONTROLLED WITH YOUR NAUSEA MEDICATION  *UNUSUAL SHORTNESS OF BREATH  *UNUSUAL BRUISING OR BLEEDING  TENDERNESS IN MOUTH AND THROAT WITH OR WITHOUT PRESENCE OF ULCERS  *URINARY PROBLEMS  *BOWEL PROBLEMS  UNUSUAL RASH Items with * indicate a potential emergency and should be followed up as soon as possible.  Feel free to call the clinic should you have any questions or concerns. The clinic phone number is [phone].  Please show the CHEMO ALERT CARD at check-in to the Emergency Department and triage nurse.

## 2020-01-12 NOTE — Progress Notes (Signed)
Maintain 5FU push at 650 mg for today despite weight gain. All other doses within 10%.  Per Sherrian Divers, PharmD

## 2020-01-12 NOTE — Progress Notes (Signed)
[place] OFFICE PROGRESS NOTE   Diagnosis: Colon cancer  INTERVAL HISTORY:   April [name] returns as scheduled.  She completed cycle 11 FOLFIRI 12/26/2019.  She denies nausea/vomiting.  No mouth sores.  No diarrhea.  Some constipation.  She notices darkening of the skin on her hands.  She continues to have urinary frequency/urgency.  Objective:  Vital signs in last 24 hours:  Blood pressure 130/89, pulse 84, temperature (!) 97.5 F (36.4 C), temperature source Tympanic, resp. rate 17, height 5' 7" (1.702 m), weight 159 lb 14.4 oz (72.5 kg), SpO2 100 %.    HEENT: No thrush or ulcers. Resp: Lungs clear bilaterally. Cardio: Regular rate and rhythm. GI: Abdomen soft and nontender.  No hepatomegaly.  No mass. Vascular: No leg edema. Skin: Hands with hyperpigmentation. Right upper extremity PICC without erythema.   Lab Results:  Lab Results  Component Value Date   WBC 8.0 01/12/2020   HGB 12.0 01/12/2020   HCT 36.3 01/12/2020   MCV 94.5 01/12/2020   PLT 250 01/12/2020   NEUTROABS 4.0 01/12/2020    Imaging:  No results found.  Medications: I have reviewed the patient's current medications.  Assessment/Plan: 1. Sigmoid colon cancer, stage IV (pT4a,pN2b,M1c) ? Colonoscopy 03/24/2019-3 rectal polyps-hyperplastic polyps, distal colon biopsy-at least intramucosal adenocarcinoma, completely obstructing mass in the distal sigmoid colon, could not be traversed, intact mismatch repair protein expression ? 03/24/2019-CEA 56.1 ? 03/29/2019 CT abdomen/pelvis-circumferential thickening involving the entire mid and distal sigmoid colon to the level of the rectosigmoid junction, solid/cystic mass in the left ovary, bilateral nephrolithiasis ? Robotic assisted low anterior resection, mesenteric lymphadenectomy, bilateral salpingo-oophorectomy 04/08/2019 ? Pathology ([facility] review of outside pathology) metastatic adenocarcinoma involving the peritoneum overlying the round  ligament, serosa of the urinary bladder, serosa of the right ureter a sacral area, pelvic peritoneum, and left ovary. Omental biopsy with focal mucin pools with no carcinoma cells identified, right hemidiaphragm biopsy involved by metastatic adenocarcinoma, invasive adenocarcinoma the sigmoid colon, moderately differentiated, T4a, perineural and vascular invasion present, 7/22 lymph nodes, multiple tumor deposits, resection margins negative ? Negative for PD-L1, low probability of MSI-high, HER-2 negative, negative for BRAF, NRAS and KRAS alterations ? CTs 05/19/2019-no evidence of metastatic disease, findings suspicious for colitis of the transverse and ascending colon, small amount of ascites in the cul-de-sac, bilateral renal calculi ? Cycle 1 FOLFIRI 05/24/2019, bevacizumab added with cycle 2 ? Cycle 4 FOLFIRI/bevacizumab 07/05/2019 ? Cycle 5 FOLFIRI 07/27/2019, bevacizumab held secondary to hypertension ? Cycle 6 FOLFIRI 08/10/1999, bevacizumab held secondary to hypertension ? CTs 08/30/2019-no evidence of recurrent disease, new subsolid right lower lobe nodule felt to be inflammatory, emphysema ? Cycle 7 FOLFIRI 09/26/2019, bevacizumab remains on hold secondary to hypertension ? Cycle 8 FOLFIRI 10/17/2019, bevacizumab held secondary to hypertension, Udenyca added for neutropenia ? Cycle 9 FOLFIRI 11/07/2019, bevacizumab held secondary to hypertension, Udenyca ? Cycle 10 FOLFIRI 11/28/2019, bevacizumab held, [place] ? Cycle 11 FOLFIRI 12/26/2019, bevacizumab held, Uden[place]ca  2. 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 Kidney stones 5. Infected Port-A-Cath 09/12/2019-placed on Augmentin, referred for Port-A-Cath removal; Port-A-Cath removed 09/14/2019; PICC line placed 09/14/2019; culture staph aureus. Course of Septra completed. 6. Neutropenia secondary to chemotherapy-Udenyca added with cycle 8 FOLFIRI   Disposition: April [name] appears stable.  She has completed 10 cycles of FOLFIRI.  Plan to proceed with  cycle 11 today as scheduled.  Restaging CTs prior to her next visit.  We reviewed the CBC from today.  Counts adequate to proceed with treatment.  She will return for lab, follow-up, FOLFIRI in 3 weeks.  She will contact the office in the interim with any problems.    Ned Card ANP/GNP-BC   01/12/2020  12:51 PM

## 2020-01-12 NOTE — Telephone Encounter (Signed)
Left message with rescheduled lab/flush appointment per 12/30 schedule message. Gave option to call back to reschedule if needed.

## 2020-01-14 ENCOUNTER — Other Ambulatory Visit: Payer: Self-pay

## 2020-01-14 ENCOUNTER — Inpatient Hospital Stay: Payer: Medicaid Other | Attending: Oncology

## 2020-01-14 VITALS — BP 138/84 | HR 85 | Temp 97.7°F | Resp 19

## 2020-01-14 DIAGNOSIS — N200 Calculus of kidney: Secondary | ICD-10-CM | POA: Diagnosis not present

## 2020-01-14 DIAGNOSIS — R5383 Other fatigue: Secondary | ICD-10-CM | POA: Insufficient documentation

## 2020-01-14 DIAGNOSIS — Z9079 Acquired absence of other genital organ(s): Secondary | ICD-10-CM | POA: Diagnosis not present

## 2020-01-14 DIAGNOSIS — Z90722 Acquired absence of ovaries, bilateral: Secondary | ICD-10-CM | POA: Insufficient documentation

## 2020-01-14 DIAGNOSIS — C19 Malignant neoplasm of rectosigmoid junction: Secondary | ICD-10-CM

## 2020-01-14 DIAGNOSIS — C187 Malignant neoplasm of sigmoid colon: Secondary | ICD-10-CM | POA: Insufficient documentation

## 2020-01-14 DIAGNOSIS — Z5189 Encounter for other specified aftercare: Secondary | ICD-10-CM | POA: Insufficient documentation

## 2020-01-14 DIAGNOSIS — Z452 Encounter for adjustment and management of vascular access device: Secondary | ICD-10-CM | POA: Diagnosis not present

## 2020-01-14 DIAGNOSIS — I10 Essential (primary) hypertension: Secondary | ICD-10-CM | POA: Insufficient documentation

## 2020-01-14 MED ORDER — HEPARIN SOD (PORK) LOCK FLUSH 100 UNIT/ML IV SOLN
500.0000 [IU] | Freq: Once | INTRAVENOUS | Status: AC | PRN
  Administered 2020-01-14: 500 [IU]
  Filled 2020-01-14: qty 5

## 2020-01-14 MED ORDER — PEGFILGRASTIM-CBQV 6 MG/0.6ML ~~LOC~~ SOSY
6.0000 mg | PREFILLED_SYRINGE | Freq: Once | SUBCUTANEOUS | Status: AC
  Administered 2020-01-14: 6 mg via SUBCUTANEOUS

## 2020-01-14 MED ORDER — SODIUM CHLORIDE 0.9% FLUSH
10.0000 mL | INTRAVENOUS | Status: DC | PRN
  Administered 2020-01-14: 10 mL
  Filled 2020-01-14: qty 10

## 2020-01-14 NOTE — Patient Instructions (Signed)

## 2020-01-25 ENCOUNTER — Inpatient Hospital Stay: Payer: Medicaid Other

## 2020-01-25 ENCOUNTER — Other Ambulatory Visit: Payer: Self-pay | Admitting: Nurse Practitioner

## 2020-01-25 ENCOUNTER — Other Ambulatory Visit: Payer: Self-pay

## 2020-01-25 ENCOUNTER — Ambulatory Visit (HOSPITAL_COMMUNITY)
Admission: RE | Admit: 2020-01-25 | Discharge: 2020-01-25 | Disposition: A | Discharge status: Home / Self Care | Payer: Medicaid Other | Source: Ambulatory Visit | Attending: Nurse Practitioner | Admitting: Nurse Practitioner

## 2020-01-25 ENCOUNTER — Encounter: Payer: Self-pay | Admitting: Oncology

## 2020-01-25 DIAGNOSIS — C19 Malignant neoplasm of rectosigmoid junction: Secondary | ICD-10-CM | POA: Insufficient documentation

## 2020-01-25 DIAGNOSIS — Z95828 Presence of other vascular implants and grafts: Secondary | ICD-10-CM

## 2020-01-25 DIAGNOSIS — Z452 Encounter for adjustment and management of vascular access device: Secondary | ICD-10-CM | POA: Diagnosis not present

## 2020-01-25 LAB — CBC WITH DIFFERENTIAL (CANCER CENTER ONLY)
Abs Immature Granulocytes: 0.05 10*3/uL (ref 0.00–0.07)
Basophils Absolute: 0.1 10*3/uL (ref 0.0–0.1)
Basophils Relative: 1 %
Eosinophils Absolute: 0.3 10*3/uL (ref 0.0–0.5)
Eosinophils Relative: 3 %
HCT: 38.1 % (ref 36.0–46.0)
Hemoglobin: 12.1 g/dL (ref 12.0–15.0)
Immature Granulocytes: 1 %
Lymphocytes Relative: 34 %
Lymphs Abs: 3.1 10*3/uL (ref 0.7–4.0)
MCH: 30.6 pg (ref 26.0–34.0)
MCHC: 31.8 g/dL (ref 30.0–36.0)
MCV: 96.2 fL (ref 80.0–100.0)
Monocytes Absolute: 0.7 10*3/uL (ref 0.1–1.0)
Monocytes Relative: 8 %
Neutro Abs: 5 10*3/uL (ref 1.7–7.7)
Neutrophils Relative %: 53 %
Platelet Count: 330 10*3/uL (ref 150–400)
RBC: 3.96 MIL/uL (ref 3.87–5.11)
RDW: 14.5 % (ref 11.5–15.5)
WBC Count: 9.1 10*3/uL (ref 4.0–10.5)
nRBC: 0 % (ref 0.0–0.2)

## 2020-01-25 LAB — CMP (CANCER CENTER ONLY)
ALT: 9 U/L (ref 0–44)
AST: 14 U/L — ABNORMAL LOW (ref 15–41)
Albumin: 3.8 g/dL (ref 3.5–5.0)
Alkaline Phosphatase: 153 U/L — ABNORMAL HIGH (ref 38–126)
Anion gap: 7 (ref 5–15)
BUN: 7 mg/dL (ref 6–20)
CO2: 28 mmol/L (ref 22–32)
Calcium: 9.6 mg/dL (ref 8.9–10.3)
Chloride: 105 mmol/L (ref 98–111)
Creatinine: 0.74 mg/dL (ref 0.44–1.00)
GFR, Estimated: >60 mL/min (ref >60)
Glucose, Bld: 90 mg/dL (ref 70–99)
Potassium: 4.1 mmol/L (ref 3.5–5.1)
Sodium: 140 mmol/L (ref 135–145)
Total Bilirubin: 0.3 mg/dL (ref 0.3–1.2)
Total Protein: 7.1 g/dL (ref 6.5–8.1)

## 2020-01-25 LAB — CEA (IN HOUSE-CHCC): CEA (CHCC-In House): 3.23 ng/mL (ref 0.00–5.00)

## 2020-01-25 MED ORDER — ALTEPLASE 2 MG IJ SOLR
INTRAMUSCULAR | Status: AC
  Filled 2020-01-25: qty 2

## 2020-01-25 MED ORDER — SODIUM CHLORIDE 0.9% FLUSH
10.0000 mL | Freq: Once | INTRAVENOUS | Status: AC
  Administered 2020-01-25: 10 mL
  Filled 2020-01-25: qty 10

## 2020-01-25 MED ORDER — HEPARIN SOD (PORK) LOCK FLUSH 100 UNIT/ML IV SOLN
500.0000 [IU] | Freq: Once | INTRAVENOUS | Status: AC
  Administered 2020-01-25: 250 [IU]
  Filled 2020-01-25: qty 5

## 2020-01-25 MED ORDER — HEPARIN SOD (PORK) LOCK FLUSH 100 UNIT/ML IV SOLN
500.0000 [IU] | Freq: Once | INTRAVENOUS | Status: DC
  Filled 2020-01-25: qty 5

## 2020-01-25 MED ORDER — ALTEPLASE 2 MG IJ SOLR
2.0000 mg | Freq: Once | INTRAMUSCULAR | Status: AC | PRN
  Administered 2020-01-25: 2 mg
  Filled 2020-01-25: qty 2

## 2020-01-25 NOTE — Addendum Note (Signed)
Addended by: Teodoro Spray on: 01/25/2020 04:14 PM   Modules accepted: Orders

## 2020-01-25 NOTE — Progress Notes (Signed)
Patient inquired about assistance with food. Advised patient of food bank downstairs. Went down and got a bag for her from Lauren(SW).  Gave patient number to [place] and sent message to them regarding [employer]. She was appreciative.  She has my contact name and number for any additional financial questions or concerns.

## 2020-01-25 NOTE — Progress Notes (Signed)
Unable to get blood return from picc. cathflo administered by Denny Peon RN. Patient sent back to lab to get blood drawn from arm. Patient also instructed to come back to infusion after CT to get cathflo and blood return tested. Also let patient know that at CT appointment picc cannot be used.  Patient verbalized understanding.

## 2020-01-26 ENCOUNTER — Encounter: Payer: Self-pay | Admitting: General Practice

## 2020-01-26 NOTE — Progress Notes (Signed)
[place] CSW Progress Notes  Call to patient are request of Financial Advocate, patient reports she has had a decrease in her Food Stamps due to an increase in her SSDI and her son moving out of the home.  She is concerned about affording food for herself and her twin daughters.  She has used up her [employer].  She can be enrolled in [place] at her next [facility] visit.  CSW also referred her to food pantries via the [place].  She was also advised to all her managed Medicaid carrier as they may have a food voucher program as well as other benefits that would be of interest to her.    Edwyna Shell, LCSW Clinical Social Worker Phone:  [phone] Cell:  [phone]

## 2020-01-29 ENCOUNTER — Other Ambulatory Visit: Payer: Self-pay | Admitting: Oncology

## 2020-01-31 ENCOUNTER — Other Ambulatory Visit: Payer: Medicaid Other

## 2020-02-01 MED FILL — Dexamethasone Sodium Phosphate Inj 100 MG/10ML: INTRAMUSCULAR | Qty: 1 | Status: AC

## 2020-02-01 MED FILL — Fosaprepitant Dimeglumine For IV Infusion 150 MG (Base Eq): INTRAVENOUS | Qty: 5 | Status: AC

## 2020-02-02 ENCOUNTER — Other Ambulatory Visit: Payer: Self-pay

## 2020-02-02 ENCOUNTER — Inpatient Hospital Stay (HOSPITAL_BASED_OUTPATIENT_CLINIC_OR_DEPARTMENT_OTHER): Payer: Medicaid Other | Admitting: Oncology

## 2020-02-02 ENCOUNTER — Encounter: Payer: Self-pay | Admitting: General Practice

## 2020-02-02 ENCOUNTER — Other Ambulatory Visit: Payer: Medicaid Other

## 2020-02-02 ENCOUNTER — Inpatient Hospital Stay: Payer: Medicaid Other | Admitting: General Practice

## 2020-02-02 ENCOUNTER — Inpatient Hospital Stay: Payer: Medicaid Other

## 2020-02-02 VITALS — BP 135/99 | HR 83 | Temp 98.9°F | Resp 18 | Ht 67.0 in | Wt 157.2 lb

## 2020-02-02 DIAGNOSIS — C19 Malignant neoplasm of rectosigmoid junction: Secondary | ICD-10-CM | POA: Diagnosis not present

## 2020-02-02 DIAGNOSIS — Z452 Encounter for adjustment and management of vascular access device: Secondary | ICD-10-CM | POA: Diagnosis not present

## 2020-02-02 MED ORDER — CAPECITABINE 500 MG PO TABS: ORAL_TABLET | ORAL | 0 refills | Status: DC

## 2020-02-02 NOTE — Progress Notes (Signed)
[place] OFFICE PROGRESS NOTE   Diagnosis: Colon cancer  INTERVAL HISTORY:   April [name] returns as scheduled.  She completed another treatment with FOLFIRI on 01/12/2020.  She feels well at present.  She reports feeling tired following chemotherapy.  She would like to discontinue the FOLFIRI. She reports her blood sugar has been normal at home. Objective:  Vital signs in last 24 hours:  Blood pressure (!) 138/103, pulse 83, temperature 98.9 F (37.2 C), temperature source Tympanic, resp. rate 18, height '5\' 7"'  (1.702 m), weight 157 lb 3.2 oz (71.3 kg), SpO2 100 %.    HEENT: No thrush or ulcer, mild hyperpigmentation at the inner lips Lymphatic: No cervical, supraclavicular, axillary, or inguinal nodes Resp: Lungs clear bilaterally Cardio: Regular rate and rhythm GI: No hepatosplenomegaly, nontender, no mass Vascular: No leg edema  Skin: Mild hyperpigmentation of the hands  Portacath/PICC-without erythema  Lab Results:  Lab Results  Component Value Date   WBC 9.1 01/25/2020   HGB 12.1 01/25/2020   HCT 38.1 01/25/2020   MCV 96.2 01/25/2020   PLT 330 01/25/2020   NEUTROABS 5.0 01/25/2020    CMP  Lab Results  Component Value Date   NA 140 01/25/2020   K 4.1 01/25/2020   CL 105 01/25/2020   CO2 28 01/25/2020   GLUCOSE 90 01/25/2020   BUN 7 01/25/2020   CREATININE 0.74 01/25/2020   CALCIUM 9.6 01/25/2020   PROT 7.1 01/25/2020   ALBUMIN 3.8 01/25/2020   AST 14 (L) 01/25/2020   ALT 9 01/25/2020   ALKPHOS 153 (H) 01/25/2020   BILITOT 0.3 01/25/2020   GFRNONAA >60 01/25/2020   GFRAA >60 10/17/2019    Lab Results  Component Value Date   CEA1 3.23 01/25/2020     Medications: I have reviewed the patient's current medications.   Assessment/Plan: 1. Sigmoid colon cancer, stage IV (pT4a,pN2b,M1c) ? Colonoscopy 03/24/2019-3 rectal polyps-hyperplastic polyps, distal colon biopsy-at least intramucosal adenocarcinoma, completely obstructing  mass in the distal sigmoid colon, could not be traversed, intact mismatch repair protein expression ? 03/24/2019-CEA 56.1 ? 03/29/2019 CT abdomen/pelvis-circumferential thickening involving the entire mid and distal sigmoid colon to the level of the rectosigmoid junction, solid/cystic mass in the left ovary, bilateral nephrolithiasis ? Robotic assisted low anterior resection, mesenteric lymphadenectomy, bilateral salpingo-oophorectomy 04/08/2019 ? Pathology ([facility] review of outside pathology) metastatic adenocarcinoma involving the peritoneum overlying the round ligament, serosa of the urinary bladder, serosa of the right ureter a sacral area, pelvic peritoneum, and left ovary. Omental biopsy with focal mucin pools with no carcinoma cells identified, right hemidiaphragm biopsy involved by metastatic adenocarcinoma, invasive adenocarcinoma the sigmoid colon, moderately differentiated, T4a, perineural and vascular invasion present, 7/22 lymph nodes, multiple tumor deposits, resection margins negative ? Negative for PD-L1, low probability of MSI-high, HER-2 negative, negative for BRAF, NRAS and KRAS alterations ? CTs 05/19/2019-no evidence of metastatic disease, findings suspicious for colitis of the transverse and ascending colon, small amount of ascites in the cul-de-sac, bilateral renal calculi ? Cycle 1 FOLFIRI 05/24/2019, bevacizumab added with cycle 2 ? Cycle 4 FOLFIRI/bevacizumab 07/05/2019 ? Cycle 5 FOLFIRI 07/27/2019, bevacizumab held secondary to hypertension ? Cycle 6 FOLFIRI 08/10/1999, bevacizumab held secondary to hypertension ? CTs 08/30/2019-no evidence of recurrent disease, new subsolid right lower lobe nodule felt to be inflammatory, emphysema ? Cycle 7 FOLFIRI 09/26/2019, bevacizumab remains on hold secondary to hypertension ? Cycle 8 FOLFIRI 10/17/2019, bevacizumab held secondary to hypertension, Udenyca added for neutropenia ? Cycle 9 FOLFIRI 11/07/2019, bevacizumab held secondary  to  hypertension, Udenyca ? Cycle 10 FOLFIRI 11/28/2019, bevacizumab held, [place] ? Cycle 11 FOLFIRI 12/26/2019, bevacizumab held, [place] ? CTs 01/25/2020-no evidence of recurrent disease, resolution of right lower lobe nodule ? Maintenance Xeloda beginning 02/06/2020  2. 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 Kidney stones 5. Infected Port-A-Cath 09/12/2019-placed on Augmentin, referred for Port-A-Cath removal; Port-A-Cath removed 09/14/2019; PICC line placed 09/14/2019; culture staph aureus. Course of Septra completed. 6. Neutropenia secondary to chemotherapy-Udenyca added with cycle 8 FOLFIRI     Disposition: Ms. Moffa has a history of metastatic colon cancer.  She has been treated with FOLFIRI based chemotherapy since June 2021.  There is no clinical or radiologic evidence of disease progression.  We discussed treatment options including continuation of FOLFIRI, observation, or maintenance capecitabine.  She understands no therapy will be curative.  The goal of maintenance therapy is to prolong disease-free survival.  She prefers maintenance capecitabine.  We reviewed potential toxicities associated with capecitabine including the chance for nausea, mucositis, hematologic toxicity, rash, sun sensitivity, hyperpigmentation, diarrhea, and hand/foot syndrome.  She agrees to proceed.  The plan is to begin daily capecitabine on 02/06/2020.  We we will consider adding Avastin if her hypertension is consistently under better control.   She will return for an office visit in approximately 3 weeks.  We remove the PICC today. Betsy Coder, MD  02/02/2020  11:23 AM

## 2020-02-02 NOTE — Progress Notes (Signed)
[place] CSW Progress Notes  Per request from Red [employer], Estate manager/land agent, patient screened for [employer].  Per records, patient has already received maximum distribution from this fund and is not eligible for any more distributions.  She can receive one Box of [employer] and a food bag from [employer].    Edwyna Shell, LCSW Clinical Social Worker Phone:  [phone]

## 2020-02-02 NOTE — Patient Instructions (Signed)
PICC Removal, Adult  A peripherally inserted central catheter (PICC) is a form of IV access that allows medicines and IV fluids to be quickly distributed throughout the body. The PICC is a thin, flexible tube (catheter) that is inserted into a vein in your arm or leg. The PICC is guided through your vein until the tip sits in a large vein just outside your heart (superior vena cava or SVC). A PICC may be removed if it is no longer needed, if it causes infection, or if it is not working properly. This is usually a painless procedure. Only a trained health care provider should do this procedure. Do not try to remove a PICC line yourself. Tell a health care provider about:  Any allergies you have.  All medicines you are taking, including vitamins, herbs, eye drops, creams, and over-the-counter medicines.  Any problems you or family members have had with anesthetic medicines.  Any blood disorders you have.  Any surgeries you have had.  Any medical conditions you have.  Whether you are pregnant or may be pregnant. What are the risks? Generally, this is a safe procedure. However, problems may occur, including:  Bleeding.  Infection.  Vein blockage due to an air bubble (air embolism). What happens before the procedure?  A conversation with your health care team. You need an order from your health care provider to have the PICC removed.  Follow instructions from your health care provider about eating or drinking restrictions.  Ask your health care provider about: ? Changing or stopping your regular medicines. This is especially important if you are taking diabetes medicines or blood thinners. ? Taking over-the-counter medicines, vitamins, herbs, and supplements. ? Taking medicines such as aspirin and ibuprofen. These medicines can thin your blood. Do not take these medicines unless your health care provider tells you to take them.  Plan to have a responsible adult care for you for at  least 24 hours after you leave the hospital or clinic. This is important. What happens during the procedure?  To reduce your risk of infection: ? Your health care team will wash or sanitize their hands. ? Your skin will be washed with soap. ? Hair may be removed from the surgical area.  You will lie down flat. Your head may be positioned slightly lower than your heart. You may be instructed to keep as still as possible during the procedure.  The bandage (dressing) over the PICC will be removed.  The place where the PICC tube exits the body (exit site) will be cleaned with a germ-killing (antiseptic) solution.  A germ-free (sterile) gauze pad will be held gently over the exit site.  The health care provider will pull out the PICC tube slowly and steadily. You may be asked to hold your breath or exhale while this is done.  After the PICC tube is removed, the health care provider will gently press on the exit site for about five minutes.  Antibiotic or petroleum-based ointment will be applied to the exit site.  The exit site will be covered with an airtight (occlusive) sterile dressing, or another type of dressing.  The PICC will be inspected to make sure that the entire tube has been removed. Part of the PICC may be tested for bacteria. The procedure may vary among health care providers and hospitals. What happens after the procedure?  You may need to stay lying down.  You will be monitored to make sure that: ? There is no fluid draining from  the exit site. ? There are no signs of an air embolism. Summary  A PICC may be removed if it is no longer needed, if it causes infection, or if it is not working properly.  Only a trained health care provider should do this procedure. Do not try to remove a PICC line yourself.  Generally, this is a safe procedure. However, problems may occur, including bleeding, infection, or air embolism.  Plan to have someone with you for 24 hours after  the procedure.  After the procedure, you will be monitored to make sure that there is no fluid draining from the site and that there are no signs of an air embolism. This information is not intended to replace advice given to you by your health care provider. Make sure you discuss any questions you have with your health care provider. Document Revised: 05/12/2019 Document Reviewed: 05/12/2019 Elsevier Patient Education  [place] Removal, Adult, Care After This sheet gives you information about how to care for yourself after your procedure. Your health care provider may also give you more specific instructions. If you have problems or questions, contact your health care provider. What can I expect after the procedure? After your procedure, it is common to have:  Tenderness or soreness.  Redness, swelling, or a scab where the PICC was removed (exit site). Follow these instructions at home: For the first 24 hours after the procedure  Keep the bandage (dressing) on the exit site clean and dry. Do not remove the dressing until your health care provider tells you to do so.  Check your arm often for signs and symptoms of an infection. Check for: ? A red streak that spreads away from the dressing. ? Blood or fluid that you can see on the dressing. ? More redness or swelling.  Do not lift anything heavy or do activities that require great effort until your health care provider says it is okay. You should avoid: ? Lifting weights. ? Yard work. ? Any physical activity with repetitive arm movement.  Watch closely for any signs of an air bubble in the vein (air embolism). This is a rare but serious complication. If you have signs of air embolism, call 911 immediately and lie down on your left side to keep the air from moving into the lungs. Signs of an air embolism include: ? Difficulty breathing. ? Chest pain. ? Coughing or wheezing. ? Skin that is pale, blue, cold, or  clammy. ? Rapid pulse. ? Rapid breathing. ? Fainting. After 24 Hours have passed:  Remove your dressing as told by your health care provider. Make sure you wash your hands with soap and water before and after you change the dressing. If soap and water are not available, use hand sanitizer.  Return to your normal activities as told by your health care provider.  A small scab may develop over the exit site. Do not pick at the scab.  When bathing or showering, gently wash the exit site with soap and water. Pat it dry.  Watch for signs of infection, such as: ? Fever or chills. ? Swollen glands under the arm. ? More redness, swelling, or soreness in the arm. ? Blood, fluid, or pus coming from the exit site. ? Warmth or a bad smell at the exit site. ? A red streak spreading away from the exit site.   General instructions  Take over-the-counter and prescription medicines only as told by your health care provider. Do  not take any new medicines without checking with your health care provider first.  If you were prescribed an antibiotic medicine, apply or take it as told by your health care provider. Do not stop using the antibiotic even if your condition improves.  Keep all follow-up visits as told by your health care provider. This is important. Contact a health care provider if:  You have a fever or chills.  You have soreness, redness, or swelling on your exit site, and it gets worse.  You have swollen glands under your arm.  You have any of the following symptoms at your exit site: ? Blood, fluid, or pus. ? Unusual warmth. ? A bad smell. ? A red streak spreading away from the exit site. Get help right away if:  You have numbness or tingling in your fingers, hand, or arm.  Your arm looks blue and feels cold.  You have signs of an air embolism, such as: ? Difficulty breathing. ? Chest pain. ? Coughing or wheezing. ? Skin that is pale, blue, cold, or clammy. ? Rapid  pulse. ? Rapid breathing. ? Fainting. These symptoms may represent a serious problem that is an emergency. Do not wait to see if the symptoms will go away. Get medical help right away. Call your local emergency services (911 in the [place] Do not drive yourself to the hospital. Summary  After your procedure, it is common to have tenderness or soreness, redness, swelling, or a scab at the exit site.  Keep the dressing over the exit site clean and dry. Do not remove the dressing until your health care provider tells you to do so.  Do not lift anything heavy or do activities that require great effort until your health care provider says it is okay.  Watch closely for any signs of an air embolism. If you have signs of air embolism, call 911 immediately and lie down on your left side. This information is not intended to replace advice given to you by your health care provider. Make sure you discuss any questions you have with your health care provider. Document Revised: 05/11/2019 Document Reviewed: 05/11/2019 Elsevier Patient Education  2021 [employer].

## 2020-02-02 NOTE — Progress Notes (Signed)
Patient presented to infusion for PICC d/c. Discontinued per procedure with no difficulties. Patient tolerated procedure well. Occlusive vaseline dressing applied to site, and patient was observed for 30 mins. At time of discharge, site clean/dry/intact. Reviewed discharge instructions with patient and she verbalized understanding.

## 2020-02-03 ENCOUNTER — Other Ambulatory Visit: Payer: Self-pay | Admitting: Oncology

## 2020-02-03 ENCOUNTER — Telehealth: Payer: Self-pay | Admitting: Pharmacist

## 2020-02-03 ENCOUNTER — Telehealth: Payer: Self-pay

## 2020-02-03 DIAGNOSIS — C19 Malignant neoplasm of rectosigmoid junction: Secondary | ICD-10-CM

## 2020-02-03 MED ORDER — CAPECITABINE 500 MG PO TABS
ORAL_TABLET | ORAL | 0 refills | Status: DC
Start: 2020-02-06 — End: 2020-03-01

## 2020-02-03 MED FILL — CAPECITABINE 500 MG TABLET: 500 | 30 days supply | Qty: 120 | Fill #0

## 2020-02-03 NOTE — Telephone Encounter (Signed)
Oral Oncology Patient Advocate Encounter  After completing a benefits investigation, prior authorization for Xeloda is not required at this time through [facility].  Patient's copay is $3.     [place] Patient [place] Phone [phone] Fax [phone] 02/03/2020 8:26 AM

## 2020-02-03 NOTE — Telephone Encounter (Signed)
Oral Chemotherapy Pharmacist Encounter  I spoke with patient for overview of: Xeloda (capecitabine) for the maintenance treatment of metastatic colon cancer, planned duration until disease progression or unacceptable drug toxicity.  Counseled patient on administration, dosing, side effects, monitoring, drug-food interactions, safe handling, storage, and disposal.  Patient will take Xeloda 500mg  tablets, 2 tablets (1000mg ) by mouth in AM and 2 tabs (1000mg ) by mouth in PM, within 30 minutes of finishing meals. Patient will take continuously.   Xeloda start date: 02/06/20  Adverse effects include but are not limited to: fatigue, decreased blood counts, GI upset, diarrhea, mouth sores, and hand-foot syndrome.  Patient will obtain anti diarrheal and alert the office of 4 or more loose stools above baseline.  Reviewed with patient importance of keeping a medication schedule and plan for any missed doses. No barriers to medication adherence identified.  Medication reconciliation performed and medication/allergy list updated.  Copayment for 1st fill of Xeloda is $3. Patient will pick this up from the [facility] on 02/03/20.  Patient informed the pharmacy will reach out 5-7 days prior to needing next fill of Xeloda to coordinate continued medication acquisition to prevent break in therapy.  All questions answered.  April [name] voiced understanding and appreciation.   Medication education handout placed in mail for patient. Patient knows to call the office with questions or concerns. Oral Chemotherapy Clinic phone number provided to patient.   April [name], PharmD, BCPS Hematology/Oncology Clinical Pharmacist [facility] [phone] 02/03/2020 12:54 PM

## 2020-02-03 NOTE — Telephone Encounter (Signed)
Oral Oncology Pharmacist Encounter  Received new prescription for Xeloda (capecitabine) for the maintenance treatment of metastatic colon cancer, planned duration until disease progression or unacceptable drug toxicity.  Prescription dose and frequency assessed for appropriateness. Appropriate for therapy initiation.   CMP and CBC w/ Diff from 01/25/20 assessed, noted ALK phos slightly elevated at 153 U/L, all other labs WNL. OK for treatment initiation.  Current medication list in Epic reviewed, no relevant/significant DDIs with Xeloda identified.  Evaluated chart and no patient barriers to medication adherence noted.   Prescription has been e-scribed to the [facility] for benefits analysis and approval.  Oral Oncology Clinic will continue to follow for insurance authorization, copayment issues, initial counseling and start date.  Leron Croak, PharmD, BCPS Hematology/Oncology Clinical Pharmacist [facility] [phone] 02/03/2020 9:45 AM

## 2020-02-07 ENCOUNTER — Telehealth: Payer: Self-pay | Admitting: *Deleted

## 2020-02-07 ENCOUNTER — Other Ambulatory Visit: Payer: Self-pay | Admitting: Family

## 2020-02-07 DIAGNOSIS — C20 Malignant neoplasm of rectum: Secondary | ICD-10-CM

## 2020-02-07 NOTE — Telephone Encounter (Signed)
Patient left VM requesting refill on HCTZ. Last filled on 11/07/19. Noted she is following her PCP for hypertension now.Called pharmacy and requested this refill request to be forwarded to her PCP office Jodi Mourning, FNP). Pharmacist agreed.

## 2020-02-08 ENCOUNTER — Telehealth: Payer: Self-pay

## 2020-02-08 NOTE — Telephone Encounter (Signed)
Error

## 2020-02-08 NOTE — Telephone Encounter (Signed)
Follow up with pt to see if she had been contacted by Dr. Herbie Baltimore Evans/urology. Pt stated she had but  needed to call them back to schedule an appointment.

## 2020-02-29 ENCOUNTER — Telehealth: Payer: Self-pay | Admitting: *Deleted

## 2020-02-29 NOTE — Telephone Encounter (Signed)
Patient left VM asking when her next appointment is scheduled? Per LOS from 1/20, it should be tomorrow. Alerted scheduler.

## 2020-03-01 ENCOUNTER — Other Ambulatory Visit: Payer: Self-pay

## 2020-03-01 ENCOUNTER — Telehealth: Payer: Self-pay

## 2020-03-01 ENCOUNTER — Inpatient Hospital Stay (HOSPITAL_BASED_OUTPATIENT_CLINIC_OR_DEPARTMENT_OTHER): Payer: Medicaid Other | Admitting: Oncology

## 2020-03-01 ENCOUNTER — Other Ambulatory Visit: Payer: Self-pay | Admitting: *Deleted

## 2020-03-01 ENCOUNTER — Inpatient Hospital Stay: Payer: Medicaid Other | Attending: Oncology

## 2020-03-01 VITALS — BP 134/90 | HR 106 | Temp 98.7°F | Resp 18 | Ht 67.0 in | Wt 158.9 lb

## 2020-03-01 DIAGNOSIS — C786 Secondary malignant neoplasm of retroperitoneum and peritoneum: Secondary | ICD-10-CM | POA: Diagnosis not present

## 2020-03-01 DIAGNOSIS — Z79899 Other long term (current) drug therapy: Secondary | ICD-10-CM | POA: Diagnosis not present

## 2020-03-01 DIAGNOSIS — L859 Epidermal thickening, unspecified: Secondary | ICD-10-CM | POA: Insufficient documentation

## 2020-03-01 DIAGNOSIS — I10 Essential (primary) hypertension: Secondary | ICD-10-CM | POA: Insufficient documentation

## 2020-03-01 DIAGNOSIS — C19 Malignant neoplasm of rectosigmoid junction: Secondary | ICD-10-CM

## 2020-03-01 DIAGNOSIS — C187 Malignant neoplasm of sigmoid colon: Secondary | ICD-10-CM | POA: Diagnosis not present

## 2020-03-01 LAB — CMP (CANCER CENTER ONLY)
ALT: 11 U/L (ref 0–44)
AST: 18 U/L (ref 15–41)
Albumin: 4.1 g/dL (ref 3.5–5.0)
Alkaline Phosphatase: 121 U/L (ref 38–126)
Anion gap: 10 (ref 5–15)
BUN: 10 mg/dL (ref 6–20)
CO2: 24 mmol/L (ref 22–32)
Calcium: 9.6 mg/dL (ref 8.9–10.3)
Chloride: 107 mmol/L (ref 98–111)
Creatinine: 0.8 mg/dL (ref 0.44–1.00)
GFR, Estimated: >60 mL/min (ref >60)
Glucose, Bld: 119 mg/dL — ABNORMAL HIGH (ref 70–99)
Potassium: 3.1 mmol/L — ABNORMAL LOW (ref 3.5–5.1)
Sodium: 141 mmol/L (ref 135–145)
Total Bilirubin: 0.3 mg/dL (ref 0.3–1.2)
Total Protein: 7.8 g/dL (ref 6.5–8.1)

## 2020-03-01 LAB — CBC WITH DIFFERENTIAL (CANCER CENTER ONLY)
Abs Immature Granulocytes: 0 10*3/uL (ref 0.00–0.07)
Basophils Absolute: 0.1 10*3/uL (ref 0.0–0.1)
Basophils Relative: 1 %
Eosinophils Absolute: 1.3 10*3/uL — ABNORMAL HIGH (ref 0.0–0.5)
Eosinophils Relative: 22 %
HCT: 42.6 % (ref 36.0–46.0)
Hemoglobin: 14.4 g/dL (ref 12.0–15.0)
Immature Granulocytes: 0 %
Lymphocytes Relative: 34 %
Lymphs Abs: 2 10*3/uL (ref 0.7–4.0)
MCH: 31.7 pg (ref 26.0–34.0)
MCHC: 33.8 g/dL (ref 30.0–36.0)
MCV: 93.8 fL (ref 80.0–100.0)
Monocytes Absolute: 0.4 10*3/uL (ref 0.1–1.0)
Monocytes Relative: 7 %
Neutro Abs: 2.1 10*3/uL (ref 1.7–7.7)
Neutrophils Relative %: 36 %
Platelet Count: 232 10*3/uL (ref 150–400)
RBC: 4.54 MIL/uL (ref 3.87–5.11)
RDW: 13.7 % (ref 11.5–15.5)
WBC Count: 5.9 10*3/uL (ref 4.0–10.5)
nRBC: 0 % (ref 0.0–0.2)

## 2020-03-01 LAB — CEA (IN HOUSE-CHCC): CEA (CHCC-In House): 2.99 ng/mL (ref 0.00–5.00)

## 2020-03-01 MED ORDER — CAPECITABINE 500 MG PO TABS: ORAL_TABLET | ORAL | 0 refills | Status: DC

## 2020-03-01 MED ORDER — POTASSIUM CHLORIDE CRYS ER 10 MEQ PO TBCR: 10.0000 meq | EXTENDED_RELEASE_TABLET | Freq: Every day | ORAL | 3 refills | Status: DC

## 2020-03-01 NOTE — Telephone Encounter (Signed)
Spoke with pt mother made her aware of results and new medication orders

## 2020-03-01 NOTE — Progress Notes (Signed)
[place] OFFICE PROGRESS NOTE   Diagnosis: Colon cancer  INTERVAL HISTORY:   Ms. Carranza returns as scheduled.  She continues Xeloda daily.  No mouth sores or diarrhea.  She has dryness at the soles.  No pain.  No other complaint.  Objective:  Vital signs in last 24 hours:  Blood pressure 134/90, pulse (!) 106, temperature 98.7 F (37.1 C), temperature source Tympanic, resp. rate 18, height _0  (1.702 m), weight 158 lb 14.4 oz (72.1 kg), SpO2 100 %.    HEENT: No thrush or ulcers Resp: Lungs clear bilaterally Cardio: Regular rate and rhythm GI: No hepatosplenomegaly Vascular: No leg edema  Skin: Palms without erythema, dryness and callus formation at the heels    Lab Results:  Lab Results  Component Value Date   WBC 5.9 03/01/2020   HGB 14.4 03/01/2020   HCT 42.6 03/01/2020   MCV 93.8 03/01/2020   PLT 232 03/01/2020   NEUTROABS 2.1 03/01/2020    CMP  Lab Results  Component Value Date   NA 140 01/25/2020   K 4.1 01/25/2020   CL 105 01/25/2020   CO2 28 01/25/2020   GLUCOSE 90 01/25/2020   BUN 7 01/25/2020   CREATININE 0.74 01/25/2020   CALCIUM 9.6 01/25/2020   PROT 7.1 01/25/2020   ALBUMIN 3.8 01/25/2020   AST 14 (L) 01/25/2020   ALT 9 01/25/2020   ALKPHOS 153 (H) 01/25/2020   BILITOT 0.3 01/25/2020   GFRNONAA >60 01/25/2020   GFRAA >60 10/17/2019    Lab Results  Component Value Date   CEA1 3.23 01/25/2020     Medications: I have reviewed the patient's current medications.   Assessment/Plan: 1. Sigmoid colon cancer, stage IV (pT4a,pN2b,M1c) ? Colonoscopy 03/24/2019-3 rectal polyps-hyperplastic polyps, distal colon biopsy-at least intramucosal adenocarcinoma, completely obstructing mass in the distal sigmoid colon, could not be traversed, intact mismatch repair protein expression ? 03/24/2019-CEA 56.1 ? 03/29/2019 CT abdomen/pelvis-circumferential thickening involving the entire mid and distal sigmoid colon to the level of the  rectosigmoid junction, solid/cystic mass in the left ovary, bilateral nephrolithiasis ? Robotic assisted low anterior resection, mesenteric lymphadenectomy, bilateral salpingo-oophorectomy 04/08/2019 ? Pathology ([facility] review of outside pathology) metastatic adenocarcinoma involving the peritoneum overlying the round ligament, serosa of the urinary bladder, serosa of the right ureter a sacral area, pelvic peritoneum, and left ovary. Omental biopsy with focal mucin pools with no carcinoma cells identified, right hemidiaphragm biopsy involved by metastatic adenocarcinoma, invasive adenocarcinoma the sigmoid colon, moderately differentiated, T4a, perineural and vascular invasion present, 7/22 lymph nodes, multiple tumor deposits, resection margins negative ? Negative for PD-L1, low probability of MSI-high, HER-2 negative, negative for BRAF, NRAS and KRAS alterations ? CTs 05/19/2019-no evidence of metastatic disease, findings suspicious for colitis of the transverse and ascending colon, small amount of ascites in the cul-de-sac, bilateral renal calculi ? Cycle 1 FOLFIRI 05/24/2019, bevacizumab added with cycle 2 ? Cycle 4 FOLFIRI/bevacizumab 07/05/2019 ? Cycle 5 FOLFIRI 07/27/2019, bevacizumab held secondary to hypertension ? Cycle 6 FOLFIRI 08/10/1999, bevacizumab held secondary to hypertension ? CTs 08/30/2019-no evidence of recurrent disease, new subsolid right lower lobe nodule felt to be inflammatory, emphysema ? Cycle 7 FOLFIRI 09/26/2019, bevacizumab remains on hold secondary to hypertension ? Cycle 8 FOLFIRI 10/17/2019, bevacizumab held secondary to hypertension, Udenyca added for neutropenia ? Cycle 9 FOLFIRI 11/07/2019, bevacizumab held secondary to hypertension, Udenyca ? Cycle 10 FOLFIRI 11/28/2019, bevacizumab held, [place] ? Cycle 11 FOLFIRI 12/26/2019, bevacizumab held, [place] ? CTs 01/25/2020-no evidence of recurrent disease, resolution  of right lower lobe nodule ? Maintenance Xeloda beginning  02/06/2020  2. 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 Kidney stones 5. Infected Port-A-Cath 09/12/2019-placed on Augmentin, referred for Port-A-Cath removal; Port-A-Cath removed 09/14/2019; PICC line placed 09/14/2019; culture staph aureus. Course of Septra completed. 6. Neutropenia secondary to chemotherapy-Udenyca added with cycle 8 FOLFIRI    Disposition: Ms. Char appears well.  She is tolerating the Xeloda without significant toxicity.  She has mild skin thickening and dryness at the soles.  She will call for pain.  She will continue daily Xeloda.  She will return for an office and lab visit in 1 month.  Her blood pressure is under better control at present.  She continues antihypertensive medications.  Betsy Coder, MD  03/01/2020  11:16 AM

## 2020-03-01 NOTE — Progress Notes (Signed)
Called patient's mother and requested she let Tarah know that her K+ is low and she needs to resume it. New script sent to [facility].

## 2020-03-01 NOTE — Telephone Encounter (Signed)
-----   Message from Ladell Pier, MD sent at 03/01/2020  1:24 PM EST ----- Please call patient, potassium is low, likely secondary to HCTZ, increase potassium to 20 mEq daily if she has been taking 10 mEq

## 2020-03-02 ENCOUNTER — Telehealth: Payer: Self-pay | Admitting: Oncology

## 2020-03-02 NOTE — Telephone Encounter (Signed)
Scheduled appointments per 2/17 los. Attempted to call patient multiple times, no answer and no voicemail available. Mailed updated calendar to patient.

## 2020-03-14 MED FILL — CAPECITABINE 500 MG TABLET: 500 | 30 days supply | Qty: 120 | Fill #0

## 2020-03-30 ENCOUNTER — Encounter: Payer: Self-pay | Admitting: Nurse Practitioner

## 2020-03-30 ENCOUNTER — Inpatient Hospital Stay (HOSPITAL_BASED_OUTPATIENT_CLINIC_OR_DEPARTMENT_OTHER): Payer: Medicaid Other | Admitting: Nurse Practitioner

## 2020-03-30 ENCOUNTER — Telehealth: Payer: Self-pay | Admitting: Nurse Practitioner

## 2020-03-30 ENCOUNTER — Inpatient Hospital Stay: Payer: Medicaid Other | Attending: Oncology

## 2020-03-30 ENCOUNTER — Other Ambulatory Visit: Payer: Self-pay

## 2020-03-30 VITALS — BP 140/80 | HR 101 | Temp 97.8°F | Resp 18 | Ht 67.0 in | Wt 163.4 lb

## 2020-03-30 DIAGNOSIS — Z79899 Other long term (current) drug therapy: Secondary | ICD-10-CM | POA: Insufficient documentation

## 2020-03-30 DIAGNOSIS — C19 Malignant neoplasm of rectosigmoid junction: Secondary | ICD-10-CM | POA: Diagnosis not present

## 2020-03-30 DIAGNOSIS — C187 Malignant neoplasm of sigmoid colon: Secondary | ICD-10-CM | POA: Diagnosis present

## 2020-03-30 LAB — CBC WITH DIFFERENTIAL (CANCER CENTER ONLY)
Abs Immature Granulocytes: 0 10*3/uL (ref 0.00–0.07)
Basophils Absolute: 0.1 10*3/uL (ref 0.0–0.1)
Basophils Relative: 1 %
Eosinophils Absolute: 2.2 10*3/uL — ABNORMAL HIGH (ref 0.0–0.5)
Eosinophils Relative: 26 %
HCT: 42.5 % (ref 36.0–46.0)
Hemoglobin: 14.7 g/dL (ref 12.0–15.0)
Immature Granulocytes: 0 %
Lymphocytes Relative: 37 %
Lymphs Abs: 3.2 10*3/uL (ref 0.7–4.0)
MCH: 32 pg (ref 26.0–34.0)
MCHC: 34.6 g/dL (ref 30.0–36.0)
MCV: 92.4 fL (ref 80.0–100.0)
Monocytes Absolute: 0.5 10*3/uL (ref 0.1–1.0)
Monocytes Relative: 6 %
Neutro Abs: 2.5 10*3/uL (ref 1.7–7.7)
Neutrophils Relative %: 30 %
Platelet Count: 267 10*3/uL (ref 150–400)
RBC: 4.6 MIL/uL (ref 3.87–5.11)
RDW: 14 % (ref 11.5–15.5)
WBC Count: 8.5 10*3/uL (ref 4.0–10.5)
nRBC: 0 % (ref 0.0–0.2)

## 2020-03-30 LAB — CMP (CANCER CENTER ONLY)
ALT: 10 U/L (ref 0–44)
AST: 15 U/L (ref 15–41)
Albumin: 4.4 g/dL (ref 3.5–5.0)
Alkaline Phosphatase: 129 U/L — ABNORMAL HIGH (ref 38–126)
Anion gap: 11 (ref 5–15)
BUN: 17 mg/dL (ref 6–20)
CO2: 24 mmol/L (ref 22–32)
Calcium: 10.1 mg/dL (ref 8.9–10.3)
Chloride: 103 mmol/L (ref 98–111)
Creatinine: 0.91 mg/dL (ref 0.44–1.00)
GFR, Estimated: >60 mL/min (ref >60)
Glucose, Bld: 153 mg/dL — ABNORMAL HIGH (ref 70–99)
Potassium: 3.6 mmol/L (ref 3.5–5.1)
Sodium: 138 mmol/L (ref 135–145)
Total Bilirubin: 0.4 mg/dL (ref 0.3–1.2)
Total Protein: 8.1 g/dL (ref 6.5–8.1)

## 2020-03-30 NOTE — Progress Notes (Signed)
[facility] OFFICE PROGRESS NOTE   Diagnosis: Colon cancer  INTERVAL HISTORY:   April [name] returns as scheduled.  She continues daily Xeloda.  She denies nausea/vomiting.  No mouth sores.  No diarrhea.  Some constipation.  No hand or foot pain or redness.  No abdominal pain.  She has a good appetite.  Objective:  Vital signs in last 24 hours:  Blood pressure 140/80, pulse (!) 101, temperature 97.8 F (36.6 C), temperature source Tympanic, resp. rate 18, height _0  (1.702 m), weight 163 lb 6.4 oz (74.1 kg), SpO2 100 %.    HEENT: No thrush or ulcers. Resp: Lungs clear bilaterally. Cardio: Regular rate and rhythm. GI: Abdomen soft and nontender.  No hepatomegaly.  No mass. Vascular: No leg edema.  Skin: Palms and soles are dry appearing.  No erythema.   Lab Results:  Lab Results  Component Value Date   WBC 5.9 03/01/2020   HGB 14.4 03/01/2020   HCT 42.6 03/01/2020   MCV 93.8 03/01/2020   PLT 232 03/01/2020   NEUTROABS 2.1 03/01/2020    Imaging:  No results found.  Medications: I have reviewed the patient's current medications.  Assessment/Plan: 1. Sigmoid colon cancer, stage IV (pT4a,pN2b,M1c) ? Colonoscopy 03/24/2019-3 rectal polyps-hyperplastic polyps, distal colon biopsy-at least intramucosal adenocarcinoma, completely obstructing mass in the distal sigmoid colon, could not be traversed, intact mismatch repair protein expression ? 03/24/2019-CEA 56.1 ? 03/29/2019 CT abdomen/pelvis-circumferential thickening involving the entire mid and distal sigmoid colon to the level of the rectosigmoid junction, solid/cystic mass in the left ovary, bilateral nephrolithiasis ? Robotic assisted low anterior resection, mesenteric lymphadenectomy, bilateral salpingo-oophorectomy 04/08/2019 ? Pathology ([facility] review of outside pathology) metastatic adenocarcinoma involving the peritoneum overlying the round ligament, serosa of the urinary bladder, serosa of the right  ureter a sacral area, pelvic peritoneum, and left ovary. Omental biopsy with focal mucin pools with no carcinoma cells identified, right hemidiaphragm biopsy involved by metastatic adenocarcinoma, invasive adenocarcinoma the sigmoid colon, moderately differentiated, T4a, perineural and vascular invasion present, 7/22 lymph nodes, multiple tumor deposits, resection margins negative ? Negative for PD-L1, low probability of MSI-high, HER-2 negative, negative for BRAF, NRAS and KRAS alterations ? CTs 05/19/2019-no evidence of metastatic disease, findings suspicious for colitis of the transverse and ascending colon, small amount of ascites in the cul-de-sac, bilateral renal calculi ? Cycle 1 FOLFIRI 05/24/2019, bevacizumab added with cycle 2 ? Cycle 4 FOLFIRI/bevacizumab 07/05/2019 ? Cycle 5 FOLFIRI 07/27/2019, bevacizumab held secondary to hypertension ? Cycle 6 FOLFIRI 08/10/1999, bevacizumab held secondary to hypertension ? CTs 08/30/2019-no evidence of recurrent disease, new subsolid right lower lobe nodule felt to be inflammatory, emphysema ? Cycle 7 FOLFIRI 09/26/2019, bevacizumab remains on hold secondary to hypertension ? Cycle 8 FOLFIRI 10/17/2019, bevacizumab held secondary to hypertension, Udenyca added for neutropenia ? Cycle 9 FOLFIRI 11/07/2019, bevacizumab held secondary to hypertension, Udenyca ? Cycle 10 FOLFIRI 11/28/2019, bevacizumab held, [place] ? Cycle 11 FOLFIRI 12/26/2019, bevacizumab held, [place] ? CTs 01/25/2020-no evidence of recurrent disease, resolution of right lower lobe nodule ? Maintenance Xeloda beginning 02/06/2020  2. 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 Kidney stones 5. Infected Port-A-Cath 09/12/2019-placed on Augmentin, referred for Port-A-Cath removal; Port-A-Cath removed 09/14/2019; PICC line placed 09/14/2019; culture staph aureus. Course of Septra completed. 6. Neutropenia secondary to chemotherapy-Udenyca added with cycle 8 FOLFIRI    Disposition: April [name]  appears stable.  There is no clinical evidence of disease progression.  She will continue maintenance Xeloda.  Plan for restaging CTs in approximately  1 month.  CBC and chemistry panel pending.  She will return for lab and follow-up in 4 weeks, CTs a few days prior.  She will contact the office in the interim with any problems.    Ned Card ANP/GNP-BC   03/30/2020  1:25 PM

## 2020-03-30 NOTE — Telephone Encounter (Signed)
Scheduled appt per 3/18 los - pt is aware of appt date and time   

## 2020-04-06 ENCOUNTER — Other Ambulatory Visit: Payer: Self-pay | Admitting: Nurse Practitioner

## 2020-04-06 ENCOUNTER — Telehealth: Payer: Self-pay | Admitting: *Deleted

## 2020-04-06 DIAGNOSIS — C19 Malignant neoplasm of rectosigmoid junction: Secondary | ICD-10-CM

## 2020-04-06 MED ORDER — OXYCODONE-ACETAMINOPHEN 5-325 MG PO TABS: 1.0000 | ORAL_TABLET | Freq: Four times a day (QID) | ORAL | 0 refills | Status: DC | PRN

## 2020-04-06 NOTE — Telephone Encounter (Signed)
April [name] is requesting a refill of oxycodone- acetaminophen 5-325 to [facility].

## 2020-04-10 ENCOUNTER — Telehealth: Payer: Self-pay | Admitting: Oncology

## 2020-04-10 NOTE — Telephone Encounter (Signed)
Rescheduled appt per [facility] - pt is aware of new appt time on 4/15

## 2020-04-11 ENCOUNTER — Other Ambulatory Visit: Payer: Self-pay | Admitting: Oncology

## 2020-04-11 DIAGNOSIS — C19 Malignant neoplasm of rectosigmoid junction: Secondary | ICD-10-CM

## 2020-04-15 ENCOUNTER — Other Ambulatory Visit (HOSPITAL_COMMUNITY): Payer: Self-pay

## 2020-04-15 MED FILL — Capecitabine Tab 500 MG: ORAL | 30 days supply | Qty: 120 | Fill #0 | Status: AC

## 2020-04-16 ENCOUNTER — Other Ambulatory Visit (HOSPITAL_COMMUNITY): Payer: Self-pay

## 2020-04-17 ENCOUNTER — Other Ambulatory Visit (HOSPITAL_COMMUNITY): Payer: Self-pay

## 2020-04-18 ENCOUNTER — Telehealth: Payer: Self-pay | Admitting: *Deleted

## 2020-04-18 NOTE — Telephone Encounter (Signed)
Able to reach her: N/V has resolved and bowels are starting to move now. Has occasional chills and does not know if she has a fever (does not own a thermometer). Also has pain in right hip that radiates into leg, but it has improved some over past 2 days. Denies any swelling or redness in leg. Also she has inquired about status of her CT scan appointment? She will pick up her oral contrast from [facility] (closer to home). Managed care notified of need for PA. Scheduled CT for 4/13 at 3:45/4:00 pm at [facility] and NPO 4 hours prior and oral contrast at 2 pm and 3 pm. She understands and agrees.

## 2020-04-18 NOTE — Telephone Encounter (Signed)
Patient had called [facility] line last night with N/V and inability to have bowel movement. Was instructed to go to ER and she did not. Have attempted to reach her at all three numbers listed as well has her mother's number without success.

## 2020-04-25 ENCOUNTER — Other Ambulatory Visit: Payer: Self-pay

## 2020-04-25 ENCOUNTER — Ambulatory Visit (HOSPITAL_COMMUNITY)
Admission: RE | Admit: 2020-04-25 | Discharge: 2020-04-25 | Disposition: A | Discharge status: Home / Self Care | Payer: Medicaid Other | Source: Ambulatory Visit | Attending: Nurse Practitioner | Admitting: Nurse Practitioner

## 2020-04-25 DIAGNOSIS — C19 Malignant neoplasm of rectosigmoid junction: Secondary | ICD-10-CM | POA: Insufficient documentation

## 2020-04-26 ENCOUNTER — Telehealth: Payer: Self-pay | Admitting: *Deleted

## 2020-04-26 MED ORDER — IBUPROFEN 800 MG PO TABS: 800.0000 mg | ORAL_TABLET | Freq: Every day | ORAL | 0 refills | Status: DC | PRN

## 2020-04-26 NOTE — Telephone Encounter (Signed)
Per Dr. Benay Spice: Agrees to Motrin 800 mg daily prn, but prefers not to order oxycodone at this time. Will discuss at her appointment tomorrow. Patient notified.

## 2020-04-26 NOTE — Addendum Note (Signed)
Addended by: Tania Ade on: 04/26/2020 02:35 PM   Modules accepted: Orders

## 2020-04-26 NOTE — Telephone Encounter (Signed)
Patient had called [employer] asking about free bus passes. Called patient and she confirms she has no difficulty getting to her cancer center appointments--already uses transportation services. Was just asking to see if we offer these for free. She reports she continues to have days when her right leg aches from groin down. Is out of her Motrin and Oxycodone. Already uses heating pad prn. Denies any swelling, discoloration or fever. Asking if Dr. Benay Spice is willing to refill the Oxycodone, saying "it helps more than Motrin".

## 2020-04-27 ENCOUNTER — Ambulatory Visit: Payer: Medicaid Other | Admitting: Oncology

## 2020-04-27 ENCOUNTER — Inpatient Hospital Stay: Payer: Medicaid Other | Admitting: Oncology

## 2020-04-27 ENCOUNTER — Other Ambulatory Visit: Payer: Medicaid Other

## 2020-04-27 ENCOUNTER — Inpatient Hospital Stay: Payer: Medicaid Other

## 2020-05-07 ENCOUNTER — Inpatient Hospital Stay (HOSPITAL_BASED_OUTPATIENT_CLINIC_OR_DEPARTMENT_OTHER): Payer: Medicaid Other | Admitting: Oncology

## 2020-05-07 ENCOUNTER — Inpatient Hospital Stay: Payer: Medicaid Other | Attending: Oncology

## 2020-05-07 ENCOUNTER — Other Ambulatory Visit: Payer: Self-pay

## 2020-05-07 VITALS — BP 140/80 | HR 97 | Temp 97.8°F | Resp 20 | Ht 67.0 in | Wt 165.0 lb

## 2020-05-07 DIAGNOSIS — N200 Calculus of kidney: Secondary | ICD-10-CM | POA: Insufficient documentation

## 2020-05-07 DIAGNOSIS — Z9079 Acquired absence of other genital organ(s): Secondary | ICD-10-CM | POA: Diagnosis not present

## 2020-05-07 DIAGNOSIS — C19 Malignant neoplasm of rectosigmoid junction: Secondary | ICD-10-CM

## 2020-05-07 DIAGNOSIS — I10 Essential (primary) hypertension: Secondary | ICD-10-CM | POA: Diagnosis not present

## 2020-05-07 DIAGNOSIS — M25552 Pain in left hip: Secondary | ICD-10-CM | POA: Diagnosis not present

## 2020-05-07 DIAGNOSIS — C7962 Secondary malignant neoplasm of left ovary: Secondary | ICD-10-CM | POA: Insufficient documentation

## 2020-05-07 DIAGNOSIS — Z79899 Other long term (current) drug therapy: Secondary | ICD-10-CM | POA: Diagnosis not present

## 2020-05-07 DIAGNOSIS — Z90722 Acquired absence of ovaries, bilateral: Secondary | ICD-10-CM | POA: Diagnosis not present

## 2020-05-07 DIAGNOSIS — C786 Secondary malignant neoplasm of retroperitoneum and peritoneum: Secondary | ICD-10-CM | POA: Diagnosis not present

## 2020-05-07 DIAGNOSIS — K5900 Constipation, unspecified: Secondary | ICD-10-CM | POA: Diagnosis not present

## 2020-05-07 DIAGNOSIS — C187 Malignant neoplasm of sigmoid colon: Secondary | ICD-10-CM | POA: Diagnosis present

## 2020-05-07 LAB — CBC WITH DIFFERENTIAL (CANCER CENTER ONLY)
Abs Immature Granulocytes: 0.02 10*3/uL (ref 0.00–0.07)
Basophils Absolute: 0.1 10*3/uL (ref 0.0–0.1)
Basophils Relative: 1 %
Eosinophils Absolute: 1.7 10*3/uL — ABNORMAL HIGH (ref 0.0–0.5)
Eosinophils Relative: 22 %
HCT: 40 % (ref 36.0–46.0)
Hemoglobin: 13.3 g/dL (ref 12.0–15.0)
Immature Granulocytes: 0 %
Lymphocytes Relative: 33 %
Lymphs Abs: 2.5 10*3/uL (ref 0.7–4.0)
MCH: 32 pg (ref 26.0–34.0)
MCHC: 33.3 g/dL (ref 30.0–36.0)
MCV: 96.4 fL (ref 80.0–100.0)
Monocytes Absolute: 0.6 10*3/uL (ref 0.1–1.0)
Monocytes Relative: 8 %
Neutro Abs: 2.8 10*3/uL (ref 1.7–7.7)
Neutrophils Relative %: 36 %
Platelet Count: 261 10*3/uL (ref 150–400)
RBC: 4.15 MIL/uL (ref 3.87–5.11)
RDW: 14.1 % (ref 11.5–15.5)
WBC Count: 7.6 10*3/uL (ref 4.0–10.5)
nRBC: 0 % (ref 0.0–0.2)

## 2020-05-07 LAB — CMP (CANCER CENTER ONLY)
ALT: 6 U/L (ref 0–44)
AST: 12 U/L — ABNORMAL LOW (ref 15–41)
Albumin: 4.2 g/dL (ref 3.5–5.0)
Alkaline Phosphatase: 91 U/L (ref 38–126)
Anion gap: 7 (ref 5–15)
BUN: 15 mg/dL (ref 6–20)
CO2: 30 mmol/L (ref 22–32)
Calcium: 9.6 mg/dL (ref 8.9–10.3)
Chloride: 104 mmol/L (ref 98–111)
Creatinine: 0.63 mg/dL (ref 0.44–1.00)
GFR, Estimated: >60 mL/min (ref >60)
Glucose, Bld: 99 mg/dL (ref 70–99)
Potassium: 3.9 mmol/L (ref 3.5–5.1)
Sodium: 141 mmol/L (ref 135–145)
Total Bilirubin: 0.3 mg/dL (ref 0.3–1.2)
Total Protein: 7.1 g/dL (ref 6.5–8.1)

## 2020-05-07 NOTE — Progress Notes (Signed)
[place] [facility] OFFICE PROGRESS NOTE   Diagnosis: Colon cancer  INTERVAL HISTORY:   April [name] returns as scheduled.  She continues Xeloda.  No mouth sores, diarrhea, or hand/foot pain.  She feels well. She developed pain in the right greater than left groin approximately 2 weeks ago.  This was worse with weightbearing.  She also has intermittent pain at the right trapezius region.  The pain has improved.  She takes Tylenol and ibuprofen for pain.  No leg swelling.  No trauma.  She continues to have intermittent constipation.  Objective:  Vital signs in last 24 hours:  Blood pressure 140/80, pulse 97, temperature 97.8 F (36.6 C), temperature source Tympanic, resp. rate 20, height $RemoveBe'5\' 7"'soIycPuhu$  (1.702 m), weight 165 lb (74.8 kg), SpO2 100 %.    HEENT: No buccal thrush or ulcers, mild white coat over the tongue Resp: Lungs clear bilaterally Cardio: Regular rate and rhythm GI: No hepatosplenomegaly, no mass, no tenderness at the groin. Vascular: No leg edema Musculoskeletal: No pain with motion at the right or left hip. Skin: Skin thickening and dryness at the soles, palms without erythema  Portacath/PICC-without erythema  Lab Results:  Lab Results  Component Value Date   WBC 7.6 05/07/2020   HGB 13.3 05/07/2020   HCT 40.0 05/07/2020   MCV 96.4 05/07/2020   PLT 261 05/07/2020   NEUTROABS 2.8 05/07/2020    CMP  Lab Results  Component Value Date   NA 141 05/07/2020   K 3.9 05/07/2020   CL 104 05/07/2020   CO2 30 05/07/2020   GLUCOSE 99 05/07/2020   BUN 15 05/07/2020   CREATININE 0.63 05/07/2020   CALCIUM 9.6 05/07/2020   PROT 7.1 05/07/2020   ALBUMIN 4.2 05/07/2020   AST 12 (L) 05/07/2020   ALT 6 05/07/2020   ALKPHOS 91 05/07/2020   BILITOT 0.3 05/07/2020   GFRNONAA >60 05/07/2020   GFRAA >60 10/17/2019    Lab Results  Component Value Date   CEA1 2.99 03/01/2020   Medications: I have reviewed the patient's current  medications.   Assessment/Plan: 1. Sigmoid colon cancer, stage IV (pT4a,pN2b,M1c) ? Colonoscopy 03/24/2019-3 rectal polyps-hyperplastic polyps, distal colon biopsy-at least intramucosal adenocarcinoma, completely obstructing mass in the distal sigmoid colon, could not be traversed, intact mismatch repair protein expression ? 03/24/2019-CEA 56.1 ? 03/29/2019 CT abdomen/pelvis-circumferential thickening involving the entire mid and distal sigmoid colon to the level of the rectosigmoid junction, solid/cystic mass in the left ovary, bilateral nephrolithiasis ? Robotic assisted low anterior resection, mesenteric lymphadenectomy, bilateral salpingo-oophorectomy 04/08/2019 ? Pathology ([facility] review of outside pathology) metastatic adenocarcinoma involving the peritoneum overlying the round ligament, serosa of the urinary bladder, serosa of the right ureter a sacral area, pelvic peritoneum, and left ovary. Omental biopsy with focal mucin pools with no carcinoma cells identified, right hemidiaphragm biopsy involved by metastatic adenocarcinoma, invasive adenocarcinoma the sigmoid colon, moderately differentiated, T4a, perineural and vascular invasion present, 7/22 lymph nodes, multiple tumor deposits, resection margins negative ? Negative for PD-L1, low probability of MSI-high, HER-2 negative, negative for BRAF, NRAS and KRAS alterations ? CTs 05/19/2019-no evidence of metastatic disease, findings suspicious for colitis of the transverse and ascending colon, small amount of ascites in the cul-de-sac, bilateral renal calculi ? Cycle 1 FOLFIRI 05/24/2019, bevacizumab added with cycle 2 ? Cycle 4 FOLFIRI/bevacizumab 07/05/2019 ? Cycle 5 FOLFIRI 07/27/2019, bevacizumab held secondary to hypertension ? Cycle 6 FOLFIRI 08/10/1999, bevacizumab held secondary to hypertension ? CTs 08/30/2019-no evidence of recurrent disease, new subsolid right lower  lobe nodule felt to be inflammatory, emphysema ? Cycle 7 FOLFIRI 09/26/2019,  bevacizumab remains on hold secondary to hypertension ? Cycle 8 FOLFIRI 10/17/2019, bevacizumab held secondary to hypertension, Udenyca added for neutropenia ? Cycle 9 FOLFIRI 11/07/2019, bevacizumab held secondary to hypertension, Udenyca ? Cycle 10 FOLFIRI 11/28/2019, bevacizumab held, [place] ? Cycle 11 FOLFIRI 12/26/2019, bevacizumab held, [place] ? CTs 01/25/2020-no evidence of recurrent disease, resolution of right lower lobe nodule ? Maintenance Xeloda beginning 02/06/2020 ? CTs 04/25/2020- no evidence of metastatic disease, multiple bilateral renal calculi without hydronephrosis  2. 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 Kidney stones 5. Infected Port-A-Cath 09/12/2019-placed on Augmentin, referred for Port-A-Cath removal; Port-A-Cath removed 09/14/2019; PICC line placed 09/14/2019; culture staph aureus. Course of Septra completed. 6. Neutropenia secondary to chemotherapy-Udenyca added with cycle 8 FOLFIRI   Disposition: [name] is in clinical remission from colon cancer.  She continues maintenance Xeloda.  She is tolerating Xeloda well.  The restaging CTs on 04/25/2020 revealed no evidence of progressive colon cancer.  She will return for an office and lab visit in 4 weeks. The etiology of the right greater than left "hip "pain is unclear.  This is most likely related to a benign musculoskeletal condition.  She will call for recurrent severe pain.  We will check an erythrocyte sedimentation rate today.  She will continue Tylenol and ibuprofen as needed.  I recommend she follow-up with her primary provider if the pain persists.  I reviewed the 04/25/2020 CT images and there is no apparent acute abnormality at the right hip.    Betsy Coder, MD  05/07/2020  11:43 AM

## 2020-05-11 ENCOUNTER — Other Ambulatory Visit (HOSPITAL_COMMUNITY): Payer: Self-pay

## 2020-05-14 ENCOUNTER — Other Ambulatory Visit (HOSPITAL_COMMUNITY): Payer: Self-pay

## 2020-05-16 ENCOUNTER — Other Ambulatory Visit (HOSPITAL_COMMUNITY): Payer: Self-pay

## 2020-05-23 ENCOUNTER — Other Ambulatory Visit: Payer: Self-pay | Admitting: Oncology

## 2020-05-23 ENCOUNTER — Other Ambulatory Visit (HOSPITAL_COMMUNITY): Payer: Self-pay

## 2020-05-23 ENCOUNTER — Encounter: Payer: Self-pay | Admitting: Medical

## 2020-05-23 DIAGNOSIS — C19 Malignant neoplasm of rectosigmoid junction: Secondary | ICD-10-CM

## 2020-05-23 MED ORDER — CAPECITABINE 500 MG PO TABS
ORAL_TABLET | ORAL | 0 refills | Status: DC
  Filled 2020-05-23: qty 120, 30d supply, fill #0

## 2020-05-25 ENCOUNTER — Telehealth: Payer: Self-pay | Admitting: *Deleted

## 2020-05-25 NOTE — Telephone Encounter (Signed)
Per Dr.Sherrill, called pt to make aware of providers recommendation. Pt did not answer and was not able to leave message on pt phone. Dr.Sherrill wants to refer pt to orthopedics.

## 2020-05-25 NOTE — Telephone Encounter (Signed)
Patient is hurting in her breastbone to her back and left shoulder and is tired of hurting. I will relay this message to Dr. Benay Spice.

## 2020-05-28 DIAGNOSIS — R3915 Urgency of urination: Secondary | ICD-10-CM | POA: Insufficient documentation

## 2020-06-04 ENCOUNTER — Other Ambulatory Visit: Payer: Self-pay

## 2020-06-04 ENCOUNTER — Inpatient Hospital Stay: Payer: Medicaid Other

## 2020-06-04 ENCOUNTER — Inpatient Hospital Stay: Payer: Medicaid Other | Attending: Oncology | Admitting: Nurse Practitioner

## 2020-06-04 ENCOUNTER — Encounter: Payer: Self-pay | Admitting: Nurse Practitioner

## 2020-06-04 VITALS — BP 140/80 | HR 85 | Temp 98.5°F | Resp 18 | Ht 67.0 in | Wt 157.8 lb

## 2020-06-04 DIAGNOSIS — M25511 Pain in right shoulder: Secondary | ICD-10-CM | POA: Diagnosis not present

## 2020-06-04 DIAGNOSIS — I10 Essential (primary) hypertension: Secondary | ICD-10-CM | POA: Diagnosis not present

## 2020-06-04 DIAGNOSIS — N200 Calculus of kidney: Secondary | ICD-10-CM | POA: Diagnosis not present

## 2020-06-04 DIAGNOSIS — C187 Malignant neoplasm of sigmoid colon: Secondary | ICD-10-CM | POA: Diagnosis not present

## 2020-06-04 DIAGNOSIS — C19 Malignant neoplasm of rectosigmoid junction: Secondary | ICD-10-CM

## 2020-06-04 DIAGNOSIS — Z79899 Other long term (current) drug therapy: Secondary | ICD-10-CM | POA: Diagnosis not present

## 2020-06-04 LAB — SEDIMENTATION RATE: Sed Rate: 25 mm/hr — ABNORMAL HIGH (ref 0–22)

## 2020-06-04 LAB — CBC WITH DIFFERENTIAL (CANCER CENTER ONLY)
Abs Immature Granulocytes: 0.01 10*3/uL (ref 0.00–0.07)
Basophils Absolute: 0.1 10*3/uL (ref 0.0–0.1)
Basophils Relative: 1 %
Eosinophils Absolute: 1.4 10*3/uL — ABNORMAL HIGH (ref 0.0–0.5)
Eosinophils Relative: 19 %
HCT: 46.6 % — ABNORMAL HIGH (ref 36.0–46.0)
Hemoglobin: 15.6 g/dL — ABNORMAL HIGH (ref 12.0–15.0)
Immature Granulocytes: 0 %
Lymphocytes Relative: 45 %
Lymphs Abs: 3.3 10*3/uL (ref 0.7–4.0)
MCH: 31.3 pg (ref 26.0–34.0)
MCHC: 33.5 g/dL (ref 30.0–36.0)
MCV: 93.6 fL (ref 80.0–100.0)
Monocytes Absolute: 0.5 10*3/uL (ref 0.1–1.0)
Monocytes Relative: 7 %
Neutro Abs: 2.1 10*3/uL (ref 1.7–7.7)
Neutrophils Relative %: 28 %
Platelet Count: 307 10*3/uL (ref 150–400)
RBC: 4.98 MIL/uL (ref 3.87–5.11)
RDW: 13.6 % (ref 11.5–15.5)
WBC Count: 7.3 10*3/uL (ref 4.0–10.5)
nRBC: 0 % (ref 0.0–0.2)

## 2020-06-04 LAB — CMP (CANCER CENTER ONLY)
ALT: 6 U/L (ref 0–44)
AST: 13 U/L — ABNORMAL LOW (ref 15–41)
Albumin: 4.8 g/dL (ref 3.5–5.0)
Alkaline Phosphatase: 108 U/L (ref 38–126)
Anion gap: 9 (ref 5–15)
BUN: 21 mg/dL — ABNORMAL HIGH (ref 6–20)
CO2: 29 mmol/L (ref 22–32)
Calcium: 10 mg/dL (ref 8.9–10.3)
Chloride: 99 mmol/L (ref 98–111)
Creatinine: 0.92 mg/dL (ref 0.44–1.00)
GFR, Estimated: >60 mL/min (ref >60)
Glucose, Bld: 106 mg/dL — ABNORMAL HIGH (ref 70–99)
Potassium: 4 mmol/L (ref 3.5–5.1)
Sodium: 137 mmol/L (ref 135–145)
Total Bilirubin: 0.4 mg/dL (ref 0.3–1.2)
Total Protein: 8.5 g/dL — ABNORMAL HIGH (ref 6.5–8.1)

## 2020-06-04 LAB — CEA (IN HOUSE-CHCC): CEA (CHCC-In House): 7.04 ng/mL — ABNORMAL HIGH (ref 0.00–5.00)

## 2020-06-04 LAB — CEA (ACCESS): CEA (CHCC): 4.8 ng/mL (ref 0.00–5.00)

## 2020-06-04 NOTE — Progress Notes (Signed)
[place] OFFICE PROGRESS NOTE   Diagnosis: Colon cancer  INTERVAL HISTORY:   Ms. Boys returns as scheduled.  She continues Xeloda.  She denies nausea/vomiting.  No mouth sores.  No diarrhea.  No hand or foot pain or redness.  She recently began oxybutynin and has noticed less urinary urgency.  She reports recent acute onset right shoulder pain.  Prior to that she has also had episodes of bilateral wrist and knee pain as well as pain in the right groin.  The areas of pain improve with pain medication.  Objective:  Vital signs in last 24 hours:  Blood pressure 140/80, pulse 85, temperature 98.5 F (36.9 C), temperature source Oral, resp. rate 18, height 5' 7" (1.702 m), weight 157 lb 12.8 oz (71.6 kg), SpO2 98 %.    HEENT: White coating over tongue.  No buccal thrush. Resp: Lungs clear bilaterally. Cardio: Regular rate and rhythm. GI: Abdomen soft and nontender.  No hepatomegaly. Vascular: No leg edema.  Skin: Palms without erythema. Musculoskeletal: Good range of motion at the right shoulder.  Right upper arm/shoulder nontender.  Bilateral wrist without erythema/inflammation, nontender.   Lab Results:  Lab Results  Component Value Date   WBC 7.3 06/04/2020   HGB 15.6 (H) 06/04/2020   HCT 46.6 (H) 06/04/2020   MCV 93.6 06/04/2020   PLT 307 06/04/2020   NEUTROABS 2.1 06/04/2020    Imaging:  No results found.  Medications: I have reviewed the patient's current medications.  Assessment/Plan: 1. Sigmoid colon cancer, stage IV (pT4a,pN2b,M1c) ? Colonoscopy 03/24/2019-3 rectal polyps-hyperplastic polyps, distal colon biopsy-at least intramucosal adenocarcinoma, completely obstructing mass in the distal sigmoid colon, could not be traversed, intact mismatch repair protein expression ? 03/24/2019-CEA 56.1 ? 03/29/2019 CT abdomen/pelvis-circumferential thickening involving the entire mid and distal sigmoid colon to the level of the rectosigmoid junction,  solid/cystic mass in the left ovary, bilateral nephrolithiasis ? Robotic assisted low anterior resection, mesenteric lymphadenectomy, bilateral salpingo-oophorectomy 04/08/2019 ? Pathology ([facility] review of outside pathology) metastatic adenocarcinoma involving the peritoneum overlying the round ligament, serosa of the urinary bladder, serosa of the right ureter a sacral area, pelvic peritoneum, and left ovary. Omental biopsy with focal mucin pools with no carcinoma cells identified, right hemidiaphragm biopsy involved by metastatic adenocarcinoma, invasive adenocarcinoma the sigmoid colon, moderately differentiated, T4a, perineural and vascular invasion present, 7/22 lymph nodes, multiple tumor deposits, resection margins negative ? Negative for PD-L1, low probability of MSI-high, HER-2 negative, negative for BRAF, NRAS and KRAS alterations ? CTs 05/19/2019-no evidence of metastatic disease, findings suspicious for colitis of the transverse and ascending colon, small amount of ascites in the cul-de-sac, bilateral renal calculi ? Cycle 1 FOLFIRI 05/24/2019, bevacizumab added with cycle 2 ? Cycle 4 FOLFIRI/bevacizumab 07/05/2019 ? Cycle 5 FOLFIRI 07/27/2019, bevacizumab held secondary to hypertension ? Cycle 6 FOLFIRI 08/10/1999, bevacizumab held secondary to hypertension ? CTs 08/30/2019-no evidence of recurrent disease, new subsolid right lower lobe nodule felt to be inflammatory, emphysema ? Cycle 7 FOLFIRI 09/26/2019, bevacizumab remains on hold secondary to hypertension ? Cycle 8 FOLFIRI 10/17/2019, bevacizumab held secondary to hypertension, Udenyca added for neutropenia ? Cycle 9 FOLFIRI 11/07/2019, bevacizumab held secondary to hypertension, Udenyca ? Cycle 10 FOLFIRI 11/28/2019, bevacizumab held, [place] ? Cycle 11 FOLFIRI 12/26/2019, bevacizumab held, [place] ? CTs 01/25/2020-no evidence of recurrent disease, resolution of right lower lobe nodule ? Maintenance Xeloda beginning 02/06/2020 ? CTs  04/25/2020- no evidence of metastatic disease, multiple bilateral renal calculi without hydronephrosis ? Maintenance Xeloda continued  2.  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 Kidney stones 5. Infected Port-A-Cath 09/12/2019-placed on Augmentin, referred for Port-A-Cath removal; Port-A-Cath removed 09/14/2019; PICC line placed 09/14/2019; culture staph aureus. Course of Septra completed. 6. Neutropenia secondary to chemotherapy-Udenyca added with cycle 8 FOLFIRI    Disposition: Ms. Mcgeachy appears stable.  She is on maintenance Xeloda and tolerating well.  There is no clinical evidence of disease progression.  She will continue the same.  We reviewed the CBC from today.  Labs adequate to continue with Xeloda.  She is having intermittent pain involving multiple joints.  Unlikely this is related to colon cancer.  She will follow-up with her PCP.  She will return for lab and follow-up in 4 weeks.  We are available to see her sooner if needed.  Plan reviewed with Dr. Benay Spice.    Ned Card ANP/GNP-BC   06/04/2020  11:29 AM

## 2020-06-15 ENCOUNTER — Other Ambulatory Visit (HOSPITAL_COMMUNITY): Payer: Self-pay

## 2020-06-18 ENCOUNTER — Other Ambulatory Visit (HOSPITAL_COMMUNITY): Payer: Self-pay

## 2020-07-02 ENCOUNTER — Inpatient Hospital Stay: Payer: Medicaid Other | Attending: Oncology | Admitting: Oncology

## 2020-07-02 ENCOUNTER — Telehealth: Payer: Self-pay

## 2020-07-02 ENCOUNTER — Other Ambulatory Visit: Payer: Self-pay | Admitting: Oncology

## 2020-07-02 ENCOUNTER — Inpatient Hospital Stay: Payer: Medicaid Other

## 2020-07-02 ENCOUNTER — Other Ambulatory Visit: Payer: Self-pay

## 2020-07-02 ENCOUNTER — Other Ambulatory Visit (HOSPITAL_COMMUNITY): Payer: Self-pay

## 2020-07-02 VITALS — BP 134/80 | HR 73 | Temp 97.8°F | Resp 18 | Ht 67.0 in | Wt 163.0 lb

## 2020-07-02 DIAGNOSIS — M25511 Pain in right shoulder: Secondary | ICD-10-CM | POA: Diagnosis not present

## 2020-07-02 DIAGNOSIS — C19 Malignant neoplasm of rectosigmoid junction: Secondary | ICD-10-CM

## 2020-07-02 DIAGNOSIS — Z79899 Other long term (current) drug therapy: Secondary | ICD-10-CM | POA: Insufficient documentation

## 2020-07-02 DIAGNOSIS — N200 Calculus of kidney: Secondary | ICD-10-CM | POA: Insufficient documentation

## 2020-07-02 DIAGNOSIS — C187 Malignant neoplasm of sigmoid colon: Secondary | ICD-10-CM | POA: Diagnosis present

## 2020-07-02 DIAGNOSIS — R109 Unspecified abdominal pain: Secondary | ICD-10-CM | POA: Insufficient documentation

## 2020-07-02 DIAGNOSIS — D649 Anemia, unspecified: Secondary | ICD-10-CM | POA: Insufficient documentation

## 2020-07-02 DIAGNOSIS — I10 Essential (primary) hypertension: Secondary | ICD-10-CM | POA: Diagnosis not present

## 2020-07-02 LAB — CMP (CANCER CENTER ONLY)
ALT: 6 U/L (ref 0–44)
AST: 15 U/L (ref 15–41)
Albumin: 3.9 g/dL (ref 3.5–5.0)
Alkaline Phosphatase: 83 U/L (ref 38–126)
Anion gap: 10 (ref 5–15)
BUN: 14 mg/dL (ref 6–20)
CO2: 27 mmol/L (ref 22–32)
Calcium: 9.1 mg/dL (ref 8.9–10.3)
Chloride: 104 mmol/L (ref 98–111)
Creatinine: 0.74 mg/dL (ref 0.44–1.00)
GFR, Estimated: >60 mL/min (ref >60)
Glucose, Bld: 119 mg/dL — ABNORMAL HIGH (ref 70–99)
Potassium: 3.1 mmol/L — ABNORMAL LOW (ref 3.5–5.1)
Sodium: 141 mmol/L (ref 135–145)
Total Bilirubin: 0.3 mg/dL (ref 0.3–1.2)
Total Protein: 6.4 g/dL — ABNORMAL LOW (ref 6.5–8.1)

## 2020-07-02 LAB — CBC WITH DIFFERENTIAL (CANCER CENTER ONLY)
Abs Immature Granulocytes: 0.01 10*3/uL (ref 0.00–0.07)
Basophils Absolute: 0.1 10*3/uL (ref 0.0–0.1)
Basophils Relative: 1 %
Eosinophils Absolute: 1.5 10*3/uL — ABNORMAL HIGH (ref 0.0–0.5)
Eosinophils Relative: 22 %
HCT: 34.5 % — ABNORMAL LOW (ref 36.0–46.0)
Hemoglobin: 11.6 g/dL — ABNORMAL LOW (ref 12.0–15.0)
Immature Granulocytes: 0 %
Lymphocytes Relative: 43 %
Lymphs Abs: 2.9 10*3/uL (ref 0.7–4.0)
MCH: 32.2 pg (ref 26.0–34.0)
MCHC: 33.6 g/dL (ref 30.0–36.0)
MCV: 95.8 fL (ref 80.0–100.0)
Monocytes Absolute: 0.5 10*3/uL (ref 0.1–1.0)
Monocytes Relative: 8 %
Neutro Abs: 1.7 10*3/uL (ref 1.7–7.7)
Neutrophils Relative %: 26 %
Platelet Count: 260 10*3/uL (ref 150–400)
RBC: 3.6 MIL/uL — ABNORMAL LOW (ref 3.87–5.11)
RDW: 13.3 % (ref 11.5–15.5)
WBC Count: 6.7 10*3/uL (ref 4.0–10.5)
nRBC: 0 % (ref 0.0–0.2)

## 2020-07-02 LAB — CEA (ACCESS): CEA (CHCC): 5.92 ng/mL — ABNORMAL HIGH (ref 0.00–5.00)

## 2020-07-02 MED ORDER — CAPECITABINE 500 MG PO TABS
ORAL_TABLET | ORAL | 0 refills | Status: DC
  Filled 2020-07-02: qty 120, 30d supply, fill #0

## 2020-07-02 NOTE — Telephone Encounter (Signed)
-----   Message from Ladell Pier, MD sent at 07/02/2020  1:25 PM EDT ----- Please call patient, potassium is low, resume potassium chloride daily, will repeat chemistry panel when she returns next month

## 2020-07-02 NOTE — Telephone Encounter (Signed)
Called made pt aware of most recent potassium and recommendation to restart potassium pt states she understands encouraged to call for any questions concerns or changes

## 2020-07-02 NOTE — Progress Notes (Signed)
[place] OFFICE PROGRESS NOTE   Diagnosis: Colon cancer  INTERVAL HISTORY:   April [name] returns as scheduled.  She continues Xeloda.  No mouth sores, hand/foot pain, or diarrhea.  She has intermittent "cramping "in the low abdomen.  She has urinary urgency.  Ditropan has helped.  She is scheduled for a cystoscopy tomorrow.  No bleeding.  She no longer has pain in the right hip.  She has "soreness "at the right shoulder.  Objective:  Vital signs in last 24 hours:  Blood pressure 134/80, pulse 73, temperature 97.8 F (36.6 C), temperature source Oral, resp. rate 18, height $RemoveBe'5\' 7"'EjIanfVec$  (1.702 m), weight 163 lb (73.9 kg), SpO2 100 %.    HEENT: No thrush or ulcers Resp: Lungs clear bilaterally Cardio: Regular rate and rhythm GI: No hepatosplenomegaly, no mass, nontender Vascular: No leg edema  Skin: Palms and soles without erythema.  Mild hyperpigmentation of the hands and feet.  Musculoskeletal: No pain with motion at the right shoulder or hips   Lab Results:  Lab Results  Component Value Date   WBC 6.7 07/02/2020   HGB 11.6 (L) 07/02/2020   HCT 34.5 (L) 07/02/2020   MCV 95.8 07/02/2020   PLT 260 07/02/2020   NEUTROABS 1.7 07/02/2020    CMP  Lab Results  Component Value Date   NA 137 06/04/2020   K 4.0 06/04/2020   CL 99 06/04/2020   CO2 29 06/04/2020   GLUCOSE 106 (H) 06/04/2020   BUN 21 (H) 06/04/2020   CREATININE 0.92 06/04/2020   CALCIUM 10.0 06/04/2020   PROT 8.5 (H) 06/04/2020   ALBUMIN 4.8 06/04/2020   AST 13 (L) 06/04/2020   ALT 6 06/04/2020   ALKPHOS 108 06/04/2020   BILITOT 0.4 06/04/2020   GFRNONAA >60 06/04/2020   GFRAA >60 10/17/2019    Lab Results  Component Value Date   CEA1 7.04 (H) 06/04/2020     Medications: I have reviewed the patient's current medications.   Assessment/Plan: Sigmoid colon cancer, stage IV (pT4a,pN2b,M1c) Colonoscopy 03/24/2019-3 rectal polyps-hyperplastic polyps, distal colon biopsy-at least  intramucosal adenocarcinoma, completely obstructing mass in the distal sigmoid colon, could not be traversed, intact mismatch repair protein expression 03/24/2019-CEA 56.1 03/29/2019 CT abdomen/pelvis-circumferential thickening involving the entire mid and distal sigmoid colon to the level of the rectosigmoid junction, solid/cystic mass in the left ovary, bilateral nephrolithiasis Robotic assisted low anterior resection, mesenteric lymphadenectomy, bilateral salpingo-oophorectomy 04/08/2019 Pathology ([facility] review of outside pathology) metastatic adenocarcinoma involving the peritoneum overlying the round ligament, serosa of the urinary bladder, serosa of the right ureter a sacral area, pelvic peritoneum, and left ovary.  Omental biopsy with focal mucin pools with no carcinoma cells identified, right hemidiaphragm biopsy involved by metastatic adenocarcinoma, invasive adenocarcinoma the sigmoid colon, moderately differentiated, T4a, perineural and vascular invasion present, 7/22 lymph nodes, multiple tumor deposits, resection margins negative Negative for PD-L1, low probability of MSI-high, HER-2 negative, negative for BRAF, NRAS and KRAS alterations CTs 05/19/2019-no evidence of metastatic disease, findings suspicious for colitis of the transverse and ascending colon, small amount of ascites in the cul-de-sac, bilateral renal calculi Cycle 1 FOLFIRI 05/24/2019, bevacizumab added with cycle 2 Cycle 4 FOLFIRI/bevacizumab 07/05/2019 Cycle 5 FOLFIRI 07/27/2019, bevacizumab held secondary to hypertension Cycle 6 FOLFIRI 08/10/1999, bevacizumab held secondary to hypertension CTs 08/30/2019-no evidence of recurrent disease, new subsolid right lower lobe nodule felt to be inflammatory, emphysema Cycle 7 FOLFIRI 09/26/2019, bevacizumab remains on hold secondary to hypertension Cycle 8 FOLFIRI 10/17/2019, bevacizumab held secondary to hypertension,  Udenyca added for neutropenia Cycle 9 FOLFIRI 11/07/2019, bevacizumab held  secondary to hypertension, Udenyca Cycle 10 FOLFIRI 11/28/2019, bevacizumab held, Udenyca Cycle 11 FOLFIRI 12/26/2019, bevacizumab held, Udenyca CTs 01/25/2020-no evidence of recurrent disease, resolution of right lower lobe nodule Maintenance Xeloda beginning 02/06/2020 CTs 04/25/2020- no evidence of metastatic disease, multiple bilateral renal calculi without hydronephrosis Maintenance Xeloda continued   Hypertension G4 P3, twins Kidney stones Infected Port-A-Cath 09/12/2019-placed on Augmentin, referred for Port-A-Cath removal; Port-A-Cath removed 09/14/2019; PICC line placed 09/14/2019; culture staph aureus.  Course of Septra completed. Neutropenia secondary to chemotherapy-Udenyca added with cycle 8 FOLFIRI      Disposition: April [name] appears stable.  She is tolerating Xeloda well.  I recommend she follow-up with her primary provider to evaluate the right shoulder pain.  I think it is unlikely the pain is related to her history of colon cancer or Xeloda chemotherapy.  She is scheduled for cystoscopy tomorrow to evaluate urinary symptoms.  The hemoglobin is lower today, but she denies bleeding.  The anemia is likely secondary to chemotherapy.  April [name] will return for an office and lab visit in 1 month.  Betsy Coder, MD  07/02/2020  10:47 AM

## 2020-07-03 ENCOUNTER — Telehealth: Payer: Self-pay

## 2020-07-03 NOTE — Telephone Encounter (Signed)
Pt says she was unaware of the office transfer.She says her Oncologist advised that she see her PCP about her pain that she has been having. She is requesting a MyChart visit. Is this okay with her sxs? She has labs through Oncology routinely.   CB number: [phone]

## 2020-07-04 LAB — CEA (IN HOUSE-CHCC): CEA (CHCC-In House): 8.66 ng/mL — ABNORMAL HIGH (ref 0.00–5.00)

## 2020-07-04 NOTE — Progress Notes (Signed)
I, Peterson Lombard, LAT, ATC acting as a scribe for Lynne Leader, MD.  Subjective:    CC: Right shoulder pain  HPI: Pt is a 45 y/o female c/o R shoulder and neck pain x 2 weeks w/ no known MOI.   Pt locates pain to her neck, upper T-spine and R shoulder and upper arm.  Additionally she has chronic low back pain and some groin pain.  Medical history is significant for stage IV colon cancer diagnosed March 2021 treated with resection and chemotherapy.  Currently on maintenance therapy in remission.  Neck pain: yes Radiates: yes from the neck into the R shoulder, scap and upper arm UE Numbness/tingling: No UE Weakness: No Aggravates: cervical rotation; R shoulder AROM above 90 deg Treatments tried: heat; IBU;   Pertinent review of Systems: No fevers or chills  Relevant historical information: Colon cancer as noted above.  Additionally thoracolumbar scoliosis and bilateral pars defects at L5-S1   Objective:    Vitals:   07/05/20 1233  BP: 102/72  Pulse: 74  SpO2: 97%   General: Well Developed, well nourished, and in no acute distress.   MSK: C-spine normal-appearing Nontender midline. Tender palpation right cervical paraspinal musculature and trapezius and rhomboid. Decreased cervical motion. Upper extremity strength is intact.  Right shoulder normal-appearing normal shoulder motion.  Negative impingement testing.  L-spine scoliosis pattern. Nontender midline. Mild tender palpation paraspinal musculature. Decreased lumbar motion.  Lab and Radiology Results  Diagnostic Limited MSK Ultrasound of: Right shoulder Biceps tendon intact normal-appearing Subscapularis tendon normal. Supraspinatus tendon normal. No significant subacromial bursitis. Infraspinatus tendon normal. AC joint normal-appearing Impression: Normal-appearing rotator cuff right shoulder  CT scan images of right shoulder, and spine visible on CT scan chest abdomen and pelvis dated April 25, 2020  personally and independently interpreted today. No significant right shoulder degenerative changes. No severe lumbar degenerative changes. 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 Mild scoliosis.  Thoracolumbar spine   Impression and Recommendations:    Assessment and Plan: 45 y.o. female with right cervical trapezius and periscapular neck pain.  This has been ongoing for approximately 2 weeks.  Pain thought to be due to myofascial spasm and dysfunction.  She should do quite well with physical therapy.  Recommend heating pad and tizanidine.  Fundamentally she is lost quite a bit of muscle mass with her cancer treatment and for sure activity is picking up I think her scapular and cervical stabilizing muscles just are not strong enough to meet her demands.  PT should help quite a bit.  Additionally she has chronic low back pain and some hip girdle pain that is also related to fundamental core weakness and the spondylolisthesis seen on CT scan secondary to pars defects..  Again physical therapy should be quite helpful for this issue.  Recheck in 6 weeks.  CC PCP and Dr. Benay Spice oncologist  PDMP not reviewed this encounter. Orders Placed This Encounter  Procedures   [place] LIMITED JOINT SPACE STRUCTURES UP RIGHT(NO LINKED CHARGES)    Order Specific Question:   Reason for Exam (SYMPTOM  OR DIAGNOSIS REQUIRED)    Answer:   R shoulder pain    Order Specific Question:   Preferred imaging location?    Answer:   [facility]   Ambulatory referral to Physical Therapy    Referral Priority:   Routine    Referral Type:   Physical Medicine    Referral Reason:   Specialty Services Required    Requested Specialty:  Physical Therapy    Number of Visits Requested:   1   Meds ordered this encounter  Medications   tiZANidine (ZANAFLEX) 4 MG tablet    Sig: Take 0.5-1 tablets (2-4 mg total) by mouth every 8 (eight) hours as needed for muscle spasms.    Dispense:  30  tablet    Refill:  1    Discussed warning signs or symptoms. Please see discharge instructions. Patient expresses understanding.   The above documentation has been reviewed and is accurate and complete Lynne Leader, M.D.

## 2020-07-04 NOTE — Telephone Encounter (Signed)
Spoke with patient.  She did try and call [facility] but they would not be able to get her in until like August.  She has not got a call from [facility] yet.  Advised she can try and call [facility] to see if another provider can see her there.

## 2020-07-05 ENCOUNTER — Other Ambulatory Visit: Payer: Self-pay

## 2020-07-05 ENCOUNTER — Encounter: Payer: Self-pay | Admitting: Family Medicine

## 2020-07-05 ENCOUNTER — Ambulatory Visit: Payer: Self-pay

## 2020-07-05 ENCOUNTER — Ambulatory Visit (INDEPENDENT_AMBULATORY_CARE_PROVIDER_SITE_OTHER): Payer: Medicaid Other | Admitting: Family Medicine

## 2020-07-05 VITALS — BP 102/72 | HR 74 | Ht 67.0 in | Wt 163.2 lb

## 2020-07-05 DIAGNOSIS — M4306 Spondylolysis, lumbar region: Secondary | ICD-10-CM | POA: Diagnosis not present

## 2020-07-05 DIAGNOSIS — M542 Cervicalgia: Secondary | ICD-10-CM | POA: Diagnosis not present

## 2020-07-05 DIAGNOSIS — M25511 Pain in right shoulder: Secondary | ICD-10-CM | POA: Diagnosis not present

## 2020-07-05 MED ORDER — TIZANIDINE HCL 4 MG PO TABS: 2.0000 mg | ORAL_TABLET | Freq: Three times a day (TID) | ORAL | 1 refills | Status: DC | PRN

## 2020-07-05 NOTE — Patient Instructions (Signed)
Thank you for coming in today.   I've referred you to Physical Therapy.  Let us know if you don't hear from them in one week.    

## 2020-07-06 ENCOUNTER — Other Ambulatory Visit: Payer: Self-pay | Admitting: Family

## 2020-07-11 ENCOUNTER — Other Ambulatory Visit (HOSPITAL_COMMUNITY): Payer: Self-pay

## 2020-07-24 ENCOUNTER — Other Ambulatory Visit (HOSPITAL_COMMUNITY): Payer: Self-pay

## 2020-07-24 ENCOUNTER — Other Ambulatory Visit: Payer: Self-pay | Admitting: Family Medicine

## 2020-07-24 NOTE — Telephone Encounter (Signed)
Rx refill request approved per Dr. Corey's orders. 

## 2020-07-26 ENCOUNTER — Other Ambulatory Visit (HOSPITAL_COMMUNITY): Payer: Self-pay

## 2020-07-30 ENCOUNTER — Telehealth: Payer: Self-pay

## 2020-07-30 NOTE — Telephone Encounter (Signed)
Pt had called service 07/28/2020 at 2114.  C/O moderate pain 5/10 in back, side and abdomen. Increased in last 4 hours. She is constipated as well. Wanted to know if it was ok to take Ibuprofen 8800 mg.  Last Bm 1 week ago. Occassionally  takes Linzess.  Did take hydrocodone 5/325.    Called pt this am at 1210- she feels better and will follow up with Dr Benay Spice at [facility] 7/20 appt

## 2020-08-01 ENCOUNTER — Inpatient Hospital Stay (HOSPITAL_BASED_OUTPATIENT_CLINIC_OR_DEPARTMENT_OTHER): Payer: Medicaid Other | Admitting: Nurse Practitioner

## 2020-08-01 ENCOUNTER — Other Ambulatory Visit: Payer: Self-pay

## 2020-08-01 ENCOUNTER — Encounter: Payer: Self-pay | Admitting: Nurse Practitioner

## 2020-08-01 ENCOUNTER — Inpatient Hospital Stay: Payer: Medicaid Other | Attending: Oncology

## 2020-08-01 VITALS — BP 119/84 | HR 86 | Temp 98.0°F | Resp 18 | Wt 161.2 lb

## 2020-08-01 DIAGNOSIS — M7918 Myalgia, other site: Secondary | ICD-10-CM | POA: Insufficient documentation

## 2020-08-01 DIAGNOSIS — N200 Calculus of kidney: Secondary | ICD-10-CM | POA: Diagnosis not present

## 2020-08-01 DIAGNOSIS — I10 Essential (primary) hypertension: Secondary | ICD-10-CM | POA: Diagnosis not present

## 2020-08-01 DIAGNOSIS — C19 Malignant neoplasm of rectosigmoid junction: Secondary | ICD-10-CM

## 2020-08-01 DIAGNOSIS — M79621 Pain in right upper arm: Secondary | ICD-10-CM | POA: Insufficient documentation

## 2020-08-01 DIAGNOSIS — C187 Malignant neoplasm of sigmoid colon: Secondary | ICD-10-CM | POA: Insufficient documentation

## 2020-08-01 DIAGNOSIS — Z79899 Other long term (current) drug therapy: Secondary | ICD-10-CM | POA: Insufficient documentation

## 2020-08-01 LAB — CMP (CANCER CENTER ONLY)
ALT: 7 U/L (ref 0–44)
AST: 15 U/L (ref 15–41)
Albumin: 4.4 g/dL (ref 3.5–5.0)
Alkaline Phosphatase: 112 U/L (ref 38–126)
Anion gap: 8 (ref 5–15)
BUN: 16 mg/dL (ref 6–20)
CO2: 30 mmol/L (ref 22–32)
Calcium: 9.3 mg/dL (ref 8.9–10.3)
Chloride: 101 mmol/L (ref 98–111)
Creatinine: 0.84 mg/dL (ref 0.44–1.00)
GFR, Estimated: >60 mL/min (ref >60)
Glucose, Bld: 117 mg/dL — ABNORMAL HIGH (ref 70–99)
Potassium: 3.6 mmol/L (ref 3.5–5.1)
Sodium: 139 mmol/L (ref 135–145)
Total Bilirubin: 0.4 mg/dL (ref 0.3–1.2)
Total Protein: 7.6 g/dL (ref 6.5–8.1)

## 2020-08-01 LAB — CBC WITH DIFFERENTIAL (CANCER CENTER ONLY)
Abs Immature Granulocytes: 0.01 10*3/uL (ref 0.00–0.07)
Basophils Absolute: 0.1 10*3/uL (ref 0.0–0.1)
Basophils Relative: 1 %
Eosinophils Absolute: 1.3 10*3/uL — ABNORMAL HIGH (ref 0.0–0.5)
Eosinophils Relative: 20 %
HCT: 41 % (ref 36.0–46.0)
Hemoglobin: 13.7 g/dL (ref 12.0–15.0)
Immature Granulocytes: 0 %
Lymphocytes Relative: 40 %
Lymphs Abs: 2.6 10*3/uL (ref 0.7–4.0)
MCH: 32.3 pg (ref 26.0–34.0)
MCHC: 33.4 g/dL (ref 30.0–36.0)
MCV: 96.7 fL (ref 80.0–100.0)
Monocytes Absolute: 0.5 10*3/uL (ref 0.1–1.0)
Monocytes Relative: 7 %
Neutro Abs: 2.1 10*3/uL (ref 1.7–7.7)
Neutrophils Relative %: 32 %
Platelet Count: 250 10*3/uL (ref 150–400)
RBC: 4.24 MIL/uL (ref 3.87–5.11)
RDW: 13.5 % (ref 11.5–15.5)
WBC Count: 6.5 10*3/uL (ref 4.0–10.5)
nRBC: 0 % (ref 0.0–0.2)

## 2020-08-01 LAB — SEDIMENTATION RATE: Sed Rate: 20 mm/hr (ref 0–22)

## 2020-08-01 LAB — CEA (ACCESS): CEA (CHCC): 9.91 ng/mL — ABNORMAL HIGH (ref 0.00–5.00)

## 2020-08-01 NOTE — Progress Notes (Signed)
April [name] OFFICE PROGRESS NOTE   Diagnosis: Colon cancer  INTERVAL HISTORY:   April [name] returns as scheduled.  She continues Xeloda.  She denies nausea/vomiting.  No mouth sores.  No diarrhea.  No hand or foot pain or redness.  Main complaint continues to be various sites of musculoskeletal pain.  Most recently pain has been located at the right upper arm.  She has tried Zanaflex with no improvement.  The pain feels to her at times to be muscle, bone or joint related.  Sometimes hand joints are swollen.  She denies fever.  No rash.  She has a family member with rheumatoid arthritis.  Objective:  Vital signs in last 24 hours:  Blood pressure 119/84, pulse 86, temperature 98 F (36.7 C), temperature source Temporal, resp. rate 18, weight 161 lb 3.2 oz (73.1 kg), SpO2 100 %.    HEENT: No thrush or ulcers. Resp: Lungs clear bilaterally. Cardio: Regular rate and rhythm. GI: Abdomen is soft.  Tender at the right upper abdomen.  No hepatomegaly.  No mass. Vascular: No leg edema. Musculoskeletal: Hand/finger joints do not appear edematous or erythematous.  Right upper arm without mass. Neuro: Alert and oriented. Skin: No rash.  Palms without erythema.   Lab Results:  Lab Results  Component Value Date   WBC 6.5 08/01/2020   HGB 13.7 08/01/2020   HCT 41.0 08/01/2020   MCV 96.7 08/01/2020   PLT 250 08/01/2020   NEUTROABS 2.1 08/01/2020    Imaging:  No results found.  Medications: I have reviewed the patient's current medications.  Assessment/Plan: Sigmoid colon cancer, stage IV (pT4a,pN2b,M1c) Colonoscopy 03/24/2019-3 rectal polyps-hyperplastic polyps, distal colon biopsy-at least intramucosal adenocarcinoma, completely obstructing mass in the distal sigmoid colon, could not be traversed, intact mismatch repair protein expression 03/24/2019-CEA 56.1 03/29/2019 CT abdomen/pelvis-circumferential thickening involving the entire mid and distal sigmoid colon to  the level of the rectosigmoid junction, solid/cystic mass in the left ovary, bilateral nephrolithiasis Robotic assisted low anterior resection, mesenteric lymphadenectomy, bilateral salpingo-oophorectomy 04/08/2019 Pathology ([facility] review of outside pathology) metastatic adenocarcinoma involving the peritoneum overlying the round ligament, serosa of the urinary bladder, serosa of the right ureter a sacral area, pelvic peritoneum, and left ovary.  Omental biopsy with focal mucin pools with no carcinoma cells identified, right hemidiaphragm biopsy involved by metastatic adenocarcinoma, invasive adenocarcinoma the sigmoid colon, moderately differentiated, T4a, perineural and vascular invasion present, 7/22 lymph nodes, multiple tumor deposits, resection margins negative Negative for PD-L1, low probability of MSI-high, HER-2 negative, negative for BRAF, NRAS and KRAS alterations CTs 05/19/2019-no evidence of metastatic disease, findings suspicious for colitis of the transverse and ascending colon, small amount of ascites in the cul-de-sac, bilateral renal calculi Cycle 1 FOLFIRI 05/24/2019, bevacizumab added with cycle 2 Cycle 4 FOLFIRI/bevacizumab 07/05/2019 Cycle 5 FOLFIRI 07/27/2019, bevacizumab held secondary to hypertension Cycle 6 FOLFIRI 08/10/1999, bevacizumab held secondary to hypertension CTs 08/30/2019-no evidence of recurrent disease, new subsolid right lower lobe nodule felt to be inflammatory, emphysema Cycle 7 FOLFIRI 09/26/2019, bevacizumab remains on hold secondary to hypertension Cycle 8 FOLFIRI 10/17/2019, bevacizumab held secondary to hypertension, Udenyca added for neutropenia Cycle 9 FOLFIRI 11/07/2019, bevacizumab held secondary to hypertension, Udenyca Cycle 10 FOLFIRI 11/28/2019, bevacizumab held, Udenyca Cycle 11 FOLFIRI 12/26/2019, bevacizumab held, Udenyca CTs 01/25/2020-no evidence of recurrent disease, resolution of right lower lobe nodule Maintenance Xeloda beginning 02/06/2020 CTs  04/25/2020- no evidence of metastatic disease, multiple bilateral renal calculi without hydronephrosis Maintenance Xeloda continued   Hypertension G4 P3, twins Kidney stones  Infected Port-A-Cath 09/12/2019-placed on Augmentin, referred for Port-A-Cath removal; Port-A-Cath removed 09/14/2019; PICC line placed 09/14/2019; culture staph aureus.  Course of Septra completed. Neutropenia secondary to chemotherapy-Udenyca added with cycle 8 FOLFIRI    Disposition: April [name] appears unchanged.  She continues maintenance Xeloda.  She seems to be tolerating well.  We will follow-up on the CEA from today.  She continues to have pain in various locations.  She will return to the lab today for rheumatoid factor, ANA, sed rate.  If negative the plan is to obtain a bone scan.  She will return for follow-up in 3 weeks.  We are available to see her sooner if needed.  Plan reviewed with Dr. Benay Spice.  Ned Card ANP/GNP-BC   08/01/2020  10:30 AM

## 2020-08-02 ENCOUNTER — Other Ambulatory Visit: Payer: Self-pay | Admitting: *Deleted

## 2020-08-02 DIAGNOSIS — C19 Malignant neoplasm of rectosigmoid junction: Secondary | ICD-10-CM

## 2020-08-05 LAB — RHEUMATOID FACTOR: Rheumatoid fact SerPl-aCnc: <10 IU/mL (ref <14.0)

## 2020-08-08 ENCOUNTER — Other Ambulatory Visit: Payer: Self-pay | Admitting: Urology

## 2020-08-08 DIAGNOSIS — N200 Calculus of kidney: Secondary | ICD-10-CM

## 2020-08-08 DIAGNOSIS — M545 Low back pain, unspecified: Secondary | ICD-10-CM

## 2020-08-08 DIAGNOSIS — R3129 Other microscopic hematuria: Secondary | ICD-10-CM

## 2020-08-09 LAB — ANTINUCLEAR ANTIBODIES, IFA: ANA Ab, IFA: NEGATIVE

## 2020-08-10 ENCOUNTER — Ambulatory Visit
Admission: RE | Admit: 2020-08-10 | Discharge: 2020-08-10 | Disposition: A | Discharge status: Home / Self Care | Payer: Medicaid Other | Source: Ambulatory Visit | Attending: Urology | Admitting: Urology

## 2020-08-10 ENCOUNTER — Other Ambulatory Visit: Payer: Self-pay

## 2020-08-10 DIAGNOSIS — M545 Low back pain, unspecified: Secondary | ICD-10-CM

## 2020-08-10 DIAGNOSIS — N200 Calculus of kidney: Secondary | ICD-10-CM

## 2020-08-10 DIAGNOSIS — R3129 Other microscopic hematuria: Secondary | ICD-10-CM

## 2020-08-13 ENCOUNTER — Other Ambulatory Visit (HOSPITAL_COMMUNITY): Payer: Self-pay

## 2020-08-13 ENCOUNTER — Other Ambulatory Visit: Payer: Self-pay | Admitting: Oncology

## 2020-08-13 DIAGNOSIS — C19 Malignant neoplasm of rectosigmoid junction: Secondary | ICD-10-CM

## 2020-08-13 MED ORDER — CAPECITABINE 500 MG PO TABS
ORAL_TABLET | ORAL | 0 refills | Status: DC
  Filled 2020-08-13: qty 120, 30d supply, fill #0

## 2020-08-15 NOTE — Progress Notes (Deleted)
   I, Wendy Poet, LAT, ATC, am serving as scribe for Dr. Lynne Leader.  April [name] is a 45 y.o. female who presents to [place] at [facility] today for f/u of R shoulder, neck and low back pain.  She was last seen by Dr. Georgina Snell on 07/05/20 and was referred to PT at [facility] but has not completed any visits.  She was also prescribed Tizanidine.  Since her last visit, pt reports  Diagnostic testing: CT abdomen/pelvis- 08/10/20  Pertinent review of systems: ***  Relevant historical information: ***   Exam:  There were no vitals taken for this visit. General: Well Developed, well nourished, and in no acute distress.   MSK: ***    Lab and Radiology Results No results found for this or any previous visit (from the past 72 hour(s)). No results found.     Assessment and Plan: 45 y.o. female with ***   PDMP not reviewed this encounter. No orders of the defined types were placed in this encounter.  No orders of the defined types were placed in this encounter.    Discussed warning signs or symptoms. Please see discharge instructions. Patient expresses understanding.   ***

## 2020-08-16 ENCOUNTER — Other Ambulatory Visit: Payer: Self-pay | Admitting: Nurse Practitioner

## 2020-08-16 ENCOUNTER — Telehealth: Payer: Self-pay | Admitting: *Deleted

## 2020-08-16 ENCOUNTER — Ambulatory Visit: Payer: Medicaid Other | Admitting: Family Medicine

## 2020-08-16 ENCOUNTER — Other Ambulatory Visit: Payer: Self-pay | Admitting: *Deleted

## 2020-08-16 DIAGNOSIS — C19 Malignant neoplasm of rectosigmoid junction: Secondary | ICD-10-CM

## 2020-08-16 NOTE — Telephone Encounter (Signed)
-----   Message from Owens Shark, NP sent at 08/16/2020  8:04 AM EDT ----- Please let her know the rheumatologic lab evaluation returned negative.  Dr. Benay Spice recommends proceeding with a bone scan.  I will place the order today.

## 2020-08-16 NOTE — Telephone Encounter (Signed)
Patient notified that rheumatologic labs were negative and bone scan is recommended. Scheduled at [facility] fo r8/10/22 at 1145/1200, then return at 3 pm for scan. She agrees to plan. Notified managed care to complete PA

## 2020-08-20 NOTE — Progress Notes (Deleted)
   I, Wendy Poet, LAT, ATC, am serving as scribe for Dr. Lynne Leader.  April [name] is a 45 y.o. female who presents to [place] at [facility] today for f/u of R shoulder, neck and LBP.  She was last seen by Dr. Georgina Snell on 07/05/20 and was prescribed Tizanidine.  She was also referred to PT at [facility] of which she's completed no visits.  Medical history is significant for stage IV colon cancer diagnosed March 2021 treated with resection and chemotherapy.  Currently on maintenance therapy in remission.  Since her last visit, pt reports   Pertinent review of systems: ***  Relevant historical information: ***   Exam:  There were no vitals taken for this visit. General: Well Developed, well nourished, and in no acute distress.   MSK: ***    Lab and Radiology Results No results found for this or any previous visit (from the past 72 hour(s)). No results found.     Assessment and Plan: 45 y.o. female with ***   PDMP not reviewed this encounter. No orders of the defined types were placed in this encounter.  No orders of the defined types were placed in this encounter.    Discussed warning signs or symptoms. Please see discharge instructions. Patient expresses understanding.   ***

## 2020-08-21 ENCOUNTER — Other Ambulatory Visit: Payer: Self-pay | Admitting: Family Medicine

## 2020-08-21 ENCOUNTER — Ambulatory Visit: Payer: Medicaid Other | Admitting: Family Medicine

## 2020-08-22 ENCOUNTER — Encounter (HOSPITAL_COMMUNITY): Payer: Medicaid Other

## 2020-08-22 ENCOUNTER — Ambulatory Visit (HOSPITAL_COMMUNITY): Admission: RE | Admit: 2020-08-22 | Payer: Medicaid Other | Source: Ambulatory Visit

## 2020-08-23 ENCOUNTER — Other Ambulatory Visit: Payer: Self-pay

## 2020-08-23 ENCOUNTER — Inpatient Hospital Stay: Payer: Medicaid Other | Attending: Oncology | Admitting: Oncology

## 2020-08-23 ENCOUNTER — Other Ambulatory Visit: Payer: Self-pay | Admitting: Nurse Practitioner

## 2020-08-23 ENCOUNTER — Inpatient Hospital Stay: Payer: Medicaid Other

## 2020-08-23 VITALS — BP 150/90 | HR 88 | Temp 97.8°F | Resp 18 | Ht 67.0 in | Wt 168.4 lb

## 2020-08-23 DIAGNOSIS — R32 Unspecified urinary incontinence: Secondary | ICD-10-CM | POA: Diagnosis not present

## 2020-08-23 DIAGNOSIS — K669 Disorder of peritoneum, unspecified: Secondary | ICD-10-CM | POA: Diagnosis not present

## 2020-08-23 DIAGNOSIS — C187 Malignant neoplasm of sigmoid colon: Secondary | ICD-10-CM | POA: Insufficient documentation

## 2020-08-23 DIAGNOSIS — R109 Unspecified abdominal pain: Secondary | ICD-10-CM | POA: Diagnosis not present

## 2020-08-23 DIAGNOSIS — M25511 Pain in right shoulder: Secondary | ICD-10-CM | POA: Insufficient documentation

## 2020-08-23 DIAGNOSIS — N200 Calculus of kidney: Secondary | ICD-10-CM | POA: Diagnosis not present

## 2020-08-23 DIAGNOSIS — I10 Essential (primary) hypertension: Secondary | ICD-10-CM | POA: Diagnosis not present

## 2020-08-23 DIAGNOSIS — C19 Malignant neoplasm of rectosigmoid junction: Secondary | ICD-10-CM | POA: Diagnosis not present

## 2020-08-23 DIAGNOSIS — K5900 Constipation, unspecified: Secondary | ICD-10-CM | POA: Diagnosis not present

## 2020-08-23 LAB — CBC WITH DIFFERENTIAL (CANCER CENTER ONLY)
Abs Immature Granulocytes: 0.01 10*3/uL (ref 0.00–0.07)
Basophils Absolute: 0.1 10*3/uL (ref 0.0–0.1)
Basophils Relative: 1 %
Eosinophils Absolute: 1.3 10*3/uL — ABNORMAL HIGH (ref 0.0–0.5)
Eosinophils Relative: 20 %
HCT: 39.7 % (ref 36.0–46.0)
Hemoglobin: 13.3 g/dL (ref 12.0–15.0)
Immature Granulocytes: 0 %
Lymphocytes Relative: 44 %
Lymphs Abs: 2.8 10*3/uL (ref 0.7–4.0)
MCH: 32.4 pg (ref 26.0–34.0)
MCHC: 33.5 g/dL (ref 30.0–36.0)
MCV: 96.6 fL (ref 80.0–100.0)
Monocytes Absolute: 0.4 10*3/uL (ref 0.1–1.0)
Monocytes Relative: 6 %
Neutro Abs: 1.9 10*3/uL (ref 1.7–7.7)
Neutrophils Relative %: 29 %
Platelet Count: 287 10*3/uL (ref 150–400)
RBC: 4.11 MIL/uL (ref 3.87–5.11)
RDW: 13.8 % (ref 11.5–15.5)
WBC Count: 6.4 10*3/uL (ref 4.0–10.5)
nRBC: 0 % (ref 0.0–0.2)

## 2020-08-23 LAB — CMP (CANCER CENTER ONLY)
ALT: 7 U/L (ref 0–44)
AST: 15 U/L (ref 15–41)
Albumin: 4.3 g/dL (ref 3.5–5.0)
Alkaline Phosphatase: 91 U/L (ref 38–126)
Anion gap: 9 (ref 5–15)
BUN: 13 mg/dL (ref 6–20)
CO2: 28 mmol/L (ref 22–32)
Calcium: 9.7 mg/dL (ref 8.9–10.3)
Chloride: 105 mmol/L (ref 98–111)
Creatinine: 0.63 mg/dL (ref 0.44–1.00)
GFR, Estimated: >60 mL/min (ref >60)
Glucose, Bld: 87 mg/dL (ref 70–99)
Potassium: 3.9 mmol/L (ref 3.5–5.1)
Sodium: 142 mmol/L (ref 135–145)
Total Bilirubin: 0.4 mg/dL (ref 0.3–1.2)
Total Protein: 7.4 g/dL (ref 6.5–8.1)

## 2020-08-23 LAB — CEA (ACCESS): CEA (CHCC): 9.15 ng/mL — ABNORMAL HIGH (ref 0.00–5.00)

## 2020-08-23 MED ORDER — HYDROMORPHONE HCL 2 MG PO TABS: 2.0000 mg | ORAL_TABLET | Freq: Two times a day (BID) | ORAL | 0 refills | Status: DC | PRN

## 2020-08-23 MED ORDER — SORBITOL 70 % PO SOLN: 15.0000 mL | Freq: Two times a day (BID) | ORAL | 0 refills | Status: DC

## 2020-08-23 NOTE — Progress Notes (Signed)
[place] OFFICE PROGRESS NOTE   Diagnosis: Colon cancer  INTERVAL HISTORY:   [name] returns as scheduled.  She continue Xeloda.  No mouth sores, nausea, or diarrhea.  She reports constipation that is not relieved with MiraLAX.  She has rectal urgency.  She saw Dr. Amalia Hailey for evaluation of urinary incontinence and incomplete emptying.  She was placed on Ditropan.  This has helped. A CT of the abdomen and pelvis on 08/10/2020 revealed staghorn renal calculi on the right and left.  No hydronephrosis.  No evidence of metastatic disease.  She has not seen Dr. Amalia Hailey for a follow-up visit.  The pain in the right leg and hip area has resolved.  She has pain in the right upper arm.  No arm swelling.  No neck pain.  Objective:  Vital signs in last 24 hours:  Blood pressure (!) 150/90, pulse 88, temperature 97.8 F (36.6 C), temperature source Oral, resp. rate 18, height '5\' 7"'  (1.702 m), weight 168 lb 6.4 oz (76.4 kg), SpO2 100 %.    HEENT: No thrush or ulcers Resp: Lungs clear bilaterally Cardio: Regular rate and rhythm GI: No mass, no hepatosplenomegaly, mild tenderness in the mid and right upper abdomen Vascular: No leg edema  Skin: Mild hyperpigmentation of the palms and soles, dryness at the soles   Lab Results:  Lab Results  Component Value Date   WBC 6.5 08/01/2020   HGB 13.7 08/01/2020   HCT 41.0 08/01/2020   MCV 96.7 08/01/2020   PLT 250 08/01/2020   NEUTROABS 2.1 08/01/2020    CMP  Lab Results  Component Value Date   NA 139 08/01/2020   K 3.6 08/01/2020   CL 101 08/01/2020   CO2 30 08/01/2020   GLUCOSE 117 (H) 08/01/2020   BUN 16 08/01/2020   CREATININE 0.84 08/01/2020   CALCIUM 9.3 08/01/2020   PROT 7.6 08/01/2020   ALBUMIN 4.4 08/01/2020   AST 15 08/01/2020   ALT 7 08/01/2020   ALKPHOS 112 08/01/2020   BILITOT 0.4 08/01/2020   GFRNONAA >60 08/01/2020   GFRAA >60 10/17/2019    Lab Results  Component Value Date   CEA1 8.66 (H)  07/02/2020   CEA 9.91 (H) 08/01/2020    Medications: I have reviewed the patient's current medications.   Assessment/Plan: Sigmoid colon cancer, stage IV (pT4a,pN2b,M1c) Colonoscopy 03/24/2019-3 rectal polyps-hyperplastic polyps, distal colon biopsy-at least intramucosal adenocarcinoma, completely obstructing mass in the distal sigmoid colon, could not be traversed, intact mismatch repair protein expression 03/24/2019-CEA 56.1 03/29/2019 CT abdomen/pelvis-circumferential thickening involving the entire mid and distal sigmoid colon to the level of the rectosigmoid junction, solid/cystic mass in the left ovary, bilateral nephrolithiasis Robotic assisted low anterior resection, mesenteric lymphadenectomy, bilateral salpingo-oophorectomy 04/08/2019 Pathology ([facility] review of outside pathology) metastatic adenocarcinoma involving the peritoneum overlying the round ligament, serosa of the urinary bladder, serosa of the right ureter a sacral area, pelvic peritoneum, and left ovary.  Omental biopsy with focal mucin pools with no carcinoma cells identified, right hemidiaphragm biopsy involved by metastatic adenocarcinoma, invasive adenocarcinoma the sigmoid colon, moderately differentiated, T4a, perineural and vascular invasion present, 7/22 lymph nodes, multiple tumor deposits, resection margins negative Negative for PD-L1, low probability of MSI-high, HER-2 negative, negative for BRAF, NRAS and KRAS alterations CTs 05/19/2019-no evidence of metastatic disease, findings suspicious for colitis of the transverse and ascending colon, small amount of ascites in the cul-de-sac, bilateral renal calculi Cycle 1 FOLFIRI 05/24/2019, bevacizumab added with cycle 2 Cycle 4 FOLFIRI/bevacizumab 07/05/2019 Cycle  5 FOLFIRI 07/27/2019, bevacizumab held secondary to hypertension Cycle 6 FOLFIRI 08/10/1999, bevacizumab held secondary to hypertension CTs 08/30/2019-no evidence of recurrent disease, new subsolid right lower lobe nodule  felt to be inflammatory, emphysema Cycle 7 FOLFIRI 09/26/2019, bevacizumab remains on hold secondary to hypertension Cycle 8 FOLFIRI 10/17/2019, bevacizumab held secondary to hypertension, Udenyca added for neutropenia Cycle 9 FOLFIRI 11/07/2019, bevacizumab held secondary to hypertension, Udenyca Cycle 10 FOLFIRI 11/28/2019, bevacizumab held, Udenyca Cycle 11 FOLFIRI 12/26/2019, bevacizumab held, Udenyca CTs 01/25/2020-no evidence of recurrent disease, resolution of right lower lobe nodule Maintenance Xeloda beginning 02/06/2020 CTs 04/25/2020- no evidence of metastatic disease, multiple bilateral renal calculi without hydronephrosis Maintenance Xeloda continued CT abdomen/pelvis without contrast 08/10/2020-bilateral staghorn renal calculi, no evidence of metastatic disease   Hypertension G4 P3, twins Kidney stones-bilateral staghorn renal calculi on CT 08/10/2020 Infected Port-A-Cath 09/12/2019-placed on Augmentin, referred for Port-A-Cath removal; Port-A-Cath removed 09/14/2019; PICC line placed 09/14/2019; culture staph aureus.  Course of Septra completed. Neutropenia secondary to chemotherapy-Udenyca added with cycle 8 FOLFIRI      Disposition: April [name] has a history of metastatic colon cancer.  She is currently being treated with maintenance Xeloda.  She is tolerating Xeloda well.  She will continue Xeloda at the current dose.  She will return to the lab for a CBC and chemistry panel today.  She has pain in the right shoulder of unclear etiology.  I suspect this is benign musculoskeletal pain.  She has multiple other complaints including abdominal pain, constipation, and urinary incontinence.  It is possible her pain is in part related to the renal calculi.  We will refer her back to Dr. Amalia Hailey.  She will begin a trial of sorbitol for constipation.  I gave her a prescription for Dilaudid to use as needed for pain.  She reports this has worked in the past.  I reviewed the CT images and there  may be small omental nodules consistent with carcinomatosis.  We will ask for additional review of the images by radiology.  She will return for an office visit in 2 weeks.  Betsy Coder, MD  08/23/2020  10:59 AM

## 2020-08-27 ENCOUNTER — Ambulatory Visit: Payer: Medicaid Other | Attending: Family Medicine

## 2020-08-28 NOTE — Progress Notes (Deleted)
   I, Wendy Poet, LAT, ATC, am serving as scribe for Dr. Lynne Leader.  April [name] is a 45 y.o. female who presents to [place] at [facility] today for f/u of R shoulder, neck and low back pain.  She was last seen by Dr. Georgina Snell on 07/05/20 and was referred to PT at the [facility] location of which she has not completed any visits.  She was also prescribed Tizanidine.  Since her last visit, pt reports    Pertinent review of systems: ***  Relevant historical information: ***   Exam:  There were no vitals taken for this visit. General: Well Developed, well nourished, and in no acute distress.   MSK: ***    Lab and Radiology Results No results found for this or any previous visit (from the past 72 hour(s)). No results found.     Assessment and Plan: 45 y.o. female with ***   PDMP not reviewed this encounter. No orders of the defined types were placed in this encounter.  No orders of the defined types were placed in this encounter.    Discussed warning signs or symptoms. Please see discharge instructions. Patient expresses understanding.   ***

## 2020-08-29 ENCOUNTER — Ambulatory Visit: Payer: Medicaid Other | Admitting: Family Medicine

## 2020-08-30 ENCOUNTER — Telehealth: Payer: Self-pay

## 2020-08-30 NOTE — Telephone Encounter (Signed)
TC from Pt stating the sorbitol that Dr Benay Spice prescribed is not working. Pt stated she is having stomach pain. Offered some home remedies like prune juice. Pt stated she was prescribed Linzess before by her GI doctor. Per Lattie Haw T NP we don't normally prescribe that medication and to give the GI doctor a call to see if he could refill the prescription. Pt verbalized understanding.

## 2020-09-03 ENCOUNTER — Other Ambulatory Visit (HOSPITAL_COMMUNITY): Payer: Self-pay

## 2020-09-06 ENCOUNTER — Other Ambulatory Visit (HOSPITAL_COMMUNITY): Payer: Self-pay

## 2020-09-06 ENCOUNTER — Other Ambulatory Visit: Payer: Self-pay

## 2020-09-06 ENCOUNTER — Inpatient Hospital Stay: Payer: Medicaid Other | Admitting: Nurse Practitioner

## 2020-09-06 ENCOUNTER — Inpatient Hospital Stay: Payer: Medicaid Other

## 2020-09-06 ENCOUNTER — Telehealth: Payer: Self-pay | Admitting: *Deleted

## 2020-09-06 ENCOUNTER — Encounter: Payer: Self-pay | Admitting: Nurse Practitioner

## 2020-09-06 VITALS — BP 140/80 | HR 81 | Temp 97.8°F | Resp 18 | Ht 67.0 in | Wt 160.8 lb

## 2020-09-06 DIAGNOSIS — C19 Malignant neoplasm of rectosigmoid junction: Secondary | ICD-10-CM

## 2020-09-06 DIAGNOSIS — C187 Malignant neoplasm of sigmoid colon: Secondary | ICD-10-CM | POA: Diagnosis not present

## 2020-09-06 LAB — CBC WITH DIFFERENTIAL (CANCER CENTER ONLY)
Abs Immature Granulocytes: 0.01 10*3/uL (ref 0.00–0.07)
Basophils Absolute: 0.1 10*3/uL (ref 0.0–0.1)
Basophils Relative: 1 %
Eosinophils Absolute: 0.8 10*3/uL — ABNORMAL HIGH (ref 0.0–0.5)
Eosinophils Relative: 12 %
HCT: 40.1 % (ref 36.0–46.0)
Hemoglobin: 13.4 g/dL (ref 12.0–15.0)
Immature Granulocytes: 0 %
Lymphocytes Relative: 44 %
Lymphs Abs: 2.9 10*3/uL (ref 0.7–4.0)
MCH: 31.8 pg (ref 26.0–34.0)
MCHC: 33.4 g/dL (ref 30.0–36.0)
MCV: 95 fL (ref 80.0–100.0)
Monocytes Absolute: 0.5 10*3/uL (ref 0.1–1.0)
Monocytes Relative: 7 %
Neutro Abs: 2.4 10*3/uL (ref 1.7–7.7)
Neutrophils Relative %: 36 %
Platelet Count: 264 10*3/uL (ref 150–400)
RBC: 4.22 MIL/uL (ref 3.87–5.11)
RDW: 13.7 % (ref 11.5–15.5)
WBC Count: 6.6 10*3/uL (ref 4.0–10.5)
nRBC: 0 % (ref 0.0–0.2)

## 2020-09-06 LAB — CMP (CANCER CENTER ONLY)
ALT: <6 U/L (ref 0–44)
AST: 13 U/L — ABNORMAL LOW (ref 15–41)
Albumin: 4.2 g/dL (ref 3.5–5.0)
Alkaline Phosphatase: 96 U/L (ref 38–126)
Anion gap: 9 (ref 5–15)
BUN: 13 mg/dL (ref 6–20)
CO2: 31 mmol/L (ref 22–32)
Calcium: 9.6 mg/dL (ref 8.9–10.3)
Chloride: 102 mmol/L (ref 98–111)
Creatinine: 0.78 mg/dL (ref 0.44–1.00)
GFR, Estimated: >60 mL/min (ref >60)
Glucose, Bld: 99 mg/dL (ref 70–99)
Potassium: 3.4 mmol/L — ABNORMAL LOW (ref 3.5–5.1)
Sodium: 142 mmol/L (ref 135–145)
Total Bilirubin: 0.4 mg/dL (ref 0.3–1.2)
Total Protein: 7.5 g/dL (ref 6.5–8.1)

## 2020-09-06 LAB — CEA (ACCESS): CEA (CHCC): 10.28 ng/mL — ABNORMAL HIGH (ref 0.00–5.00)

## 2020-09-06 MED ORDER — DOXYCYCLINE HYCLATE 100 MG PO TABS: 100.0000 mg | ORAL_TABLET | Freq: Two times a day (BID) | ORAL | 2 refills | Status: DC

## 2020-09-06 NOTE — Telephone Encounter (Signed)
I called Dr Phoebe Perch nurse line and left a detailed messaged @ [phone] on 09/06/20 regarding patient having right flank and CT that was ordered by Dr Amalia Hailey showed kidney stones per Ned Card NP. Dr. Benay Spice felt like patient should f/u with Dr. Amalia Hailey regarding the kidney stones. I asked that the office set up and appt for patient and/or call her and assess her right flank pain.

## 2020-09-06 NOTE — Progress Notes (Signed)
[place] OFFICE PROGRESS NOTE   Diagnosis: Colon cancer  INTERVAL HISTORY:   April [name] returns as scheduled.  She continues Xeloda.  About a week ago she felt "blocked up".  She took sorbitol twice a day with no relief.  3 to 4 days ago she took a Fleet suppository and since then has been having multiple soft to liquidy bowel movements every day.  Yesterday she became "hot and sweaty", developed nausea and vomiting and had a bowel movement.  She feels better this morning.  She continues to have right-sided flank pain.  No blood with urination.  She does feel she is not emptying completely.  Objective:  Vital signs in last 24 hours:  Blood pressure 140/80, pulse 81, temperature 97.8 F (36.6 C), temperature source Oral, resp. rate 18, height '5\' 7"'  (1.702 m), weight 160 lb 12.8 oz (72.9 kg), SpO2 97 %.   Resp: Lungs clear bilaterally. Cardio: Regular rate and rhythm. GI: Abdomen is soft, tender various locations.  No hepatomegaly. Vascular: No leg edema.   Lab Results:  Lab Results  Component Value Date   WBC 6.6 09/06/2020   HGB 13.4 09/06/2020   HCT 40.1 09/06/2020   MCV 95.0 09/06/2020   PLT 264 09/06/2020   NEUTROABS 2.4 09/06/2020    Imaging:  No results found.  Medications: I have reviewed the patient's current medications.  Assessment/Plan: Sigmoid colon cancer, stage IV (pT4a,pN2b,M1c) Colonoscopy 03/24/2019-3 rectal polyps-hyperplastic polyps, distal colon biopsy-at least intramucosal adenocarcinoma, completely obstructing mass in the distal sigmoid colon, could not be traversed, intact mismatch repair protein expression 03/24/2019-CEA 56.1 03/29/2019 CT abdomen/pelvis-circumferential thickening involving the entire mid and distal sigmoid colon to the level of the rectosigmoid junction, solid/cystic mass in the left ovary, bilateral nephrolithiasis Robotic assisted low anterior resection, mesenteric lymphadenectomy, bilateral  salpingo-oophorectomy 04/08/2019 Pathology ([facility] review of outside pathology) metastatic adenocarcinoma involving the peritoneum overlying the round ligament, serosa of the urinary bladder, serosa of the right ureter a sacral area, pelvic peritoneum, and left ovary.  Omental biopsy with focal mucin pools with no carcinoma cells identified, right hemidiaphragm biopsy involved by metastatic adenocarcinoma, invasive adenocarcinoma the sigmoid colon, moderately differentiated, T4a, perineural and vascular invasion present, 7/22 lymph nodes, multiple tumor deposits, resection margins negative Negative for PD-L1, low probability of MSI-high, HER-2 negative, negative for BRAF, NRAS and KRAS alterations CTs 05/19/2019-no evidence of metastatic disease, findings suspicious for colitis of the transverse and ascending colon, small amount of ascites in the cul-de-sac, bilateral renal calculi Cycle 1 FOLFIRI 05/24/2019, bevacizumab added with cycle 2 Cycle 4 FOLFIRI/bevacizumab 07/05/2019 Cycle 5 FOLFIRI 07/27/2019, bevacizumab held secondary to hypertension Cycle 6 FOLFIRI 08/10/1999, bevacizumab held secondary to hypertension CTs 08/30/2019-no evidence of recurrent disease, new subsolid right lower lobe nodule felt to be inflammatory, emphysema Cycle 7 FOLFIRI 09/26/2019, bevacizumab remains on hold secondary to hypertension Cycle 8 FOLFIRI 10/17/2019, bevacizumab held secondary to hypertension, Udenyca added for neutropenia Cycle 9 FOLFIRI 11/07/2019, bevacizumab held secondary to hypertension, Udenyca Cycle 10 FOLFIRI 11/28/2019, bevacizumab held, Udenyca Cycle 11 FOLFIRI 12/26/2019, bevacizumab held, Udenyca CTs 01/25/2020-no evidence of recurrent disease, resolution of right lower lobe nodule Maintenance Xeloda beginning 02/06/2020 CTs 04/25/2020- no evidence of metastatic disease, multiple bilateral renal calculi without hydronephrosis Maintenance Xeloda continued CT abdomen/pelvis without contrast  08/10/2020-bilateral staghorn renal calculi, no evidence of metastatic disease; addendum 08/23/2020-small but increasing omental nodules.   Hypertension G4 P3, twins Kidney stones-bilateral staghorn renal calculi on CT 08/10/2020 Infected Port-A-Cath 09/12/2019-placed on Augmentin, referred for Port-A-Cath  removal; Port-A-Cath removed 09/14/2019; PICC line placed 09/14/2019; culture staph aureus.  Course of Septra completed. Neutropenia secondary to chemotherapy-Udenyca added with cycle 8 FOLFIRI      Disposition: April [name] appears unchanged.  Dr. Benay Spice reviewed updated CT report from 08/10/2020 with her at today's visit.  There are increasing omental nodules.  She will discontinue Xeloda.  Dr. Benay Spice recommends FOLFIRI plus Panitumumab.  She has been on FOLFIRI in the past.  We discussed potential toxicities associated with Panitumumab including rash, diarrhea, paronychia, hypomagnesia and hypokalemia.  She understands the rationale for taking doxycycline, prescription sent to her pharmacy.  She agrees with this plan.  We are referring her for PICC placement next week and anticipate cycle 1 FOLFIRI/Panitumumab in approximately 2 weeks.  We will contact Dr. Amalia Hailey office regarding the kidney stones.  She will return for lab, follow-up, cycle 1 FOLFIRI/Panitumumab in approximately 2 weeks.  We are available to see her sooner if needed.  Patient seen with Dr. Benay Spice.    Ned Card ANP/GNP-BC   09/06/2020  11:02 AM  This was a shared visit with Ned Card.  April [name] has metastatic colon cancer.  The CEA is elevated.  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  We discussed treatment options.  Xeloda will be discontinued.  She would like to continue treatment of the colon cancer.  She did not fail FOLFIRI.  The plan is to begin systemic therapy with FOLFIRI/panitumumab.  We reviewed potential toxicities associated with panitumumab. We will be sure she is  seeing Dr. Amalia Hailey to address the renal stones.   A chemotherapy plan was entered today.  I was present for greater than 50% of today's visit.  I performed medical decision making.  Julieanne Manson, MD

## 2020-09-07 ENCOUNTER — Encounter: Payer: Self-pay | Admitting: Oncology

## 2020-09-07 NOTE — Telephone Encounter (Signed)
Paige w/Dr. Amalia Hailey called to request copy of CT addendum of 08/07/20 be faxed to [phone]. Report faxed and email to [facility] radiology requesting images be pushed over to their system.

## 2020-09-07 NOTE — Progress Notes (Signed)
DISCONTINUE OFF PATHWAY REGIMEN - Colorectal   [id number]:FOLFIRI + Bevacizumab (Leucovorin IV D1 + Fluorouracil IV D1/CIV D1,2 + Irinotecan IV D1 + Bevacizumab IV D1) q14 Days:   A cycle is every 14 days:     Bevacizumab-xxxx      Irinotecan      Leucovorin      Fluorouracil      Fluorouracil   **Always confirm dose/schedule in your pharmacy ordering system**  REASON: Other Reason PRIOR TREATMENT: Off Pathway: FOLFIRI + Bevacizumab (Leucovorin IV D1 + Fluorouracil IV D1/CIV D1,2 + Irinotecan IV D1 + Bevacizumab IV D1) q14 Days TREATMENT RESPONSE: Stable Disease (SD)  START OFF PATHWAY REGIMEN - Colorectal   [id number]:FOLFIRI + Panitumumab (Leucovorin IV D1 + Fluorouracil IV D1/CIV D1,2 + Irinotecan IV D1 + Panitumumab IV D1) q14 Days:   A cycle is every 14 days:     Panitumumab      Irinotecan      Leucovorin      Fluorouracil      Fluorouracil   **Always confirm dose/schedule in your pharmacy ordering system**  Patient Characteristics: Distant Metastases, Nonsurgical Candidate, KRAS/NRAS Wild-Type (BRAF V600 Wild-Type/Unknown), Standard Cytotoxic Therapy, First Line Standard Cytotoxic Therapy, Left-sided Tumor, PS = 0,1 Tumor Location: Colon Therapeutic Status: Distant Metastases Microsatellite/Mismatch Repair Status: MSS/pMMR BRAF Mutation Status: Wild-Type (no mutation) KRAS/NRAS Mutation Status: Wild-Type (no mutation) Preferred Therapy Approach: Standard Cytotoxic Therapy Standard Cytotoxic Line of Therapy: First Line Standard Cytotoxic Therapy ECOG Performance Status: 1 Primary Tumor Location: Left-sided Tumor Intent of Therapy: Non-Curative / Palliative Intent, Discussed with Patient

## 2020-09-11 ENCOUNTER — Other Ambulatory Visit: Payer: Self-pay

## 2020-09-11 ENCOUNTER — Ambulatory Visit (HOSPITAL_COMMUNITY)
Admission: RE | Admit: 2020-09-11 | Discharge: 2020-09-11 | Disposition: A | Discharge status: Home / Self Care | Payer: Medicaid Other | Source: Ambulatory Visit | Attending: Nurse Practitioner | Admitting: Nurse Practitioner

## 2020-09-11 ENCOUNTER — Other Ambulatory Visit: Payer: Self-pay | Admitting: Nurse Practitioner

## 2020-09-11 DIAGNOSIS — C19 Malignant neoplasm of rectosigmoid junction: Secondary | ICD-10-CM

## 2020-09-11 MED ORDER — LIDOCAINE HCL 1 % IJ SOLN
INTRAMUSCULAR | Status: AC | PRN
  Administered 2020-09-11: 5 mL

## 2020-09-11 MED ORDER — LIDOCAINE HCL 1 % IJ SOLN
INTRAMUSCULAR | Status: AC
  Administered 2020-09-11: 5 mL
  Filled 2020-09-11: qty 20

## 2020-09-11 MED ORDER — HEPARIN SOD (PORK) LOCK FLUSH 100 UNIT/ML IV SOLN
INTRAVENOUS | Status: AC | PRN
  Administered 2020-09-11: 500 [IU]

## 2020-09-11 MED ORDER — HEPARIN SOD (PORK) LOCK FLUSH 100 UNIT/ML IV SOLN
INTRAVENOUS | Status: AC
  Filled 2020-09-11: qty 5

## 2020-09-11 NOTE — Procedures (Signed)
PROCEDURE SUMMARY:  Successful placement of double lumen PICC line to right brachial vein. Length 32 cm Tip at lower SVC/RA PICC capped No complications Ready for use  EBL < 5 mL   Maryori Weide H Shatoya Roets PA-C 09/11/2020, 2:49 PM

## 2020-09-14 ENCOUNTER — Inpatient Hospital Stay: Payer: Medicaid Other | Attending: Oncology

## 2020-09-14 ENCOUNTER — Other Ambulatory Visit: Payer: Self-pay

## 2020-09-14 VITALS — BP 131/101 | HR 97 | Temp 98.6°F | Resp 18

## 2020-09-14 DIAGNOSIS — C19 Malignant neoplasm of rectosigmoid junction: Secondary | ICD-10-CM

## 2020-09-14 DIAGNOSIS — C187 Malignant neoplasm of sigmoid colon: Secondary | ICD-10-CM | POA: Insufficient documentation

## 2020-09-14 DIAGNOSIS — Z95828 Presence of other vascular implants and grafts: Secondary | ICD-10-CM

## 2020-09-14 DIAGNOSIS — Z5189 Encounter for other specified aftercare: Secondary | ICD-10-CM | POA: Insufficient documentation

## 2020-09-14 DIAGNOSIS — Z5111 Encounter for antineoplastic chemotherapy: Secondary | ICD-10-CM | POA: Insufficient documentation

## 2020-09-14 DIAGNOSIS — Z5112 Encounter for antineoplastic immunotherapy: Secondary | ICD-10-CM | POA: Insufficient documentation

## 2020-09-14 DIAGNOSIS — Z79899 Other long term (current) drug therapy: Secondary | ICD-10-CM | POA: Insufficient documentation

## 2020-09-14 MED ORDER — HEPARIN SOD (PORK) LOCK FLUSH 100 UNIT/ML IV SOLN
500.0000 [IU] | Freq: Once | INTRAVENOUS | Status: AC
  Administered 2020-09-14: 500 [IU]

## 2020-09-14 MED ORDER — SODIUM CHLORIDE 0.9% FLUSH
10.0000 mL | Freq: Once | INTRAVENOUS | Status: AC
  Administered 2020-09-14: 10 mL

## 2020-09-17 ENCOUNTER — Other Ambulatory Visit: Payer: Self-pay | Admitting: Oncology

## 2020-09-18 ENCOUNTER — Other Ambulatory Visit: Payer: Self-pay | Admitting: Nurse Practitioner

## 2020-09-18 ENCOUNTER — Other Ambulatory Visit: Payer: Self-pay | Admitting: Oncology

## 2020-09-18 ENCOUNTER — Telehealth: Payer: Self-pay

## 2020-09-18 NOTE — Telephone Encounter (Signed)
TC from Pt stating she had a nephrostomy tube placed at [facility] and the nephrostomy tube is not draining. Informed Pt that she would have to call [facility] and schedule to see them about the tube.Pt confused about coming in for her treatment. Pt verbalized that she has no transportation to go to [place]. Informed Pt she should come in for her treatment and Let Dr. Benay Spice see her and see what he recommends .Pt verbalized understanding stated she will come in to see Dr Benay Spice tomorrow.

## 2020-09-19 ENCOUNTER — Inpatient Hospital Stay: Payer: Medicaid Other

## 2020-09-19 ENCOUNTER — Inpatient Hospital Stay: Payer: Medicaid Other | Admitting: Oncology

## 2020-09-19 ENCOUNTER — Inpatient Hospital Stay: Payer: Medicaid Other | Admitting: Nutrition

## 2020-09-19 ENCOUNTER — Other Ambulatory Visit: Payer: Self-pay

## 2020-09-19 VITALS — BP 134/80 | HR 80 | Temp 98.2°F | Resp 18 | Ht 67.0 in | Wt 160.8 lb

## 2020-09-19 DIAGNOSIS — Z5111 Encounter for antineoplastic chemotherapy: Secondary | ICD-10-CM | POA: Diagnosis not present

## 2020-09-19 DIAGNOSIS — C19 Malignant neoplasm of rectosigmoid junction: Secondary | ICD-10-CM

## 2020-09-19 DIAGNOSIS — N9489 Other specified conditions associated with female genital organs and menstrual cycle: Secondary | ICD-10-CM | POA: Insufficient documentation

## 2020-09-19 LAB — CBC WITH DIFFERENTIAL (CANCER CENTER ONLY)
Abs Immature Granulocytes: 0.01 10*3/uL (ref 0.00–0.07)
Basophils Absolute: 0.1 10*3/uL (ref 0.0–0.1)
Basophils Relative: 1 %
Eosinophils Absolute: 1.1 10*3/uL — ABNORMAL HIGH (ref 0.0–0.5)
Eosinophils Relative: 16 %
HCT: 36.4 % (ref 36.0–46.0)
Hemoglobin: 12.3 g/dL (ref 12.0–15.0)
Immature Granulocytes: 0 %
Lymphocytes Relative: 41 %
Lymphs Abs: 2.7 10*3/uL (ref 0.7–4.0)
MCH: 32.2 pg (ref 26.0–34.0)
MCHC: 33.8 g/dL (ref 30.0–36.0)
MCV: 95.3 fL (ref 80.0–100.0)
Monocytes Absolute: 0.6 10*3/uL (ref 0.1–1.0)
Monocytes Relative: 9 %
Neutro Abs: 2.2 10*3/uL (ref 1.7–7.7)
Neutrophils Relative %: 33 %
Platelet Count: 199 10*3/uL (ref 150–400)
RBC: 3.82 MIL/uL — ABNORMAL LOW (ref 3.87–5.11)
RDW: 13.1 % (ref 11.5–15.5)
WBC Count: 6.8 10*3/uL (ref 4.0–10.5)
nRBC: 0 % (ref 0.0–0.2)

## 2020-09-19 LAB — CMP (CANCER CENTER ONLY)
ALT: 7 U/L (ref 0–44)
AST: 16 U/L (ref 15–41)
Albumin: 4.2 g/dL (ref 3.5–5.0)
Alkaline Phosphatase: 89 U/L (ref 38–126)
Anion gap: 8 (ref 5–15)
BUN: 14 mg/dL (ref 6–20)
CO2: 29 mmol/L (ref 22–32)
Calcium: 9.3 mg/dL (ref 8.9–10.3)
Chloride: 102 mmol/L (ref 98–111)
Creatinine: 0.67 mg/dL (ref 0.44–1.00)
GFR, Estimated: >60 mL/min (ref >60)
Glucose, Bld: 111 mg/dL — ABNORMAL HIGH (ref 70–99)
Potassium: 3.5 mmol/L (ref 3.5–5.1)
Sodium: 139 mmol/L (ref 135–145)
Total Bilirubin: 0.2 mg/dL — ABNORMAL LOW (ref 0.3–1.2)
Total Protein: 7.1 g/dL (ref 6.5–8.1)

## 2020-09-19 LAB — MAGNESIUM: Magnesium: 1.9 mg/dL (ref 1.7–2.4)

## 2020-09-19 LAB — CEA (ACCESS): CEA (CHCC): 10.17 ng/mL — ABNORMAL HIGH (ref 0.00–5.00)

## 2020-09-19 MED ORDER — SODIUM CHLORIDE 0.9 % IV SOLN
2400.0000 mg/m2 | INTRAVENOUS | Status: DC
  Administered 2020-09-19: 4450 mg via INTRAVENOUS
  Filled 2020-09-19: qty 89

## 2020-09-19 MED ORDER — SODIUM CHLORIDE 0.9 % IV SOLN
6.0000 mg/kg | Freq: Once | INTRAVENOUS | Status: AC
  Administered 2020-09-19: 400 mg via INTRAVENOUS
  Filled 2020-09-19: qty 20

## 2020-09-19 MED ORDER — PALONOSETRON HCL INJECTION 0.25 MG/5ML
0.2500 mg | Freq: Once | INTRAVENOUS | Status: AC
  Administered 2020-09-19: 0.25 mg via INTRAVENOUS
  Filled 2020-09-19: qty 5

## 2020-09-19 MED ORDER — ATROPINE SULFATE 1 MG/ML IJ SOLN
0.5000 mg | Freq: Once | INTRAMUSCULAR | Status: AC
  Administered 2020-09-19: 0.5 mg via INTRAVENOUS
  Filled 2020-09-19: qty 1

## 2020-09-19 MED ORDER — SODIUM CHLORIDE 0.9 % IV SOLN: Freq: Once | INTRAVENOUS | Status: AC

## 2020-09-19 MED ORDER — SODIUM CHLORIDE 0.9 % IV SOLN
180.0000 mg/m2 | Freq: Once | INTRAVENOUS | Status: AC
  Administered 2020-09-19: 340 mg via INTRAVENOUS
  Filled 2020-09-19: qty 15

## 2020-09-19 MED ORDER — FLUOROURACIL CHEMO INJECTION 2.5 GM/50ML
400.0000 mg/m2 | Freq: Once | INTRAVENOUS | Status: AC
Start: 2020-09-19 — End: 2020-09-19
  Administered 2020-09-19: 750 mg via INTRAVENOUS
  Filled 2020-09-19: qty 15

## 2020-09-19 MED ORDER — SODIUM CHLORIDE 0.9 % IV SOLN
10.0000 mg | Freq: Once | INTRAVENOUS | Status: AC
  Administered 2020-09-19: 10 mg via INTRAVENOUS
  Filled 2020-09-19: qty 1

## 2020-09-19 MED ORDER — SODIUM CHLORIDE 0.9 % IV SOLN
400.0000 mg/m2 | Freq: Once | INTRAVENOUS | Status: AC
  Administered 2020-09-19: 744 mg via INTRAVENOUS
  Filled 2020-09-19: qty 37.2

## 2020-09-19 MED ORDER — SODIUM CHLORIDE 0.9 % IV SOLN
150.0000 mg | Freq: Once | INTRAVENOUS | Status: AC
  Administered 2020-09-19: 150 mg via INTRAVENOUS
  Filled 2020-09-19: qty 5

## 2020-09-19 NOTE — Patient Instructions (Addendum)

## 2020-09-19 NOTE — Progress Notes (Signed)
April [name]   Diagnosis: Colon cancer  INTERVAL HISTORY:   April [name] returns as scheduled.  She continues to have constipation, but she is having bowel movements.  She has intermittent abdominal pain.  She underwent placement of a right nephrostomy tube at [facility] on 09/12/2020.  She is emptying the nephrostomy bag approximately twice daily.  She empties the bag when the volume is up to approximately 25 cc.  She is scheduled for follow-up at [facility] urology tomorrow.  Objective:  Vital signs in last 24 hours:  Blood pressure 134/80, pulse 80, temperature 98.2 F (36.8 C), temperature source Oral, resp. rate 18, height 5' 7" (1.702 m), weight 160 lb 12.8 oz (72.9 kg), SpO2 100 %.      Resp: Lungs clear bilaterally Cardio: Regular rate and rhythm GI: No hepatosplenomegaly, no mass Vascular: No leg edema  Skin: Right flank nephrostomy tube with clear urine  Portacath/PICC-without erythema  Lab Results:  Lab Results  Component Value Date   WBC 6.8 09/19/2020   HGB 12.3 09/19/2020   HCT 36.4 09/19/2020   MCV 95.3 09/19/2020   PLT 199 09/19/2020   NEUTROABS 2.2 09/19/2020    CMP  Lab Results  Component Value Date   NA 139 09/19/2020   K 3.5 09/19/2020   CL 102 09/19/2020   CO2 29 09/19/2020   GLUCOSE 111 (H) 09/19/2020   BUN 14 09/19/2020   CREATININE 0.67 09/19/2020   CALCIUM 9.3 09/19/2020   PROT 7.1 09/19/2020   ALBUMIN 4.2 09/19/2020   AST 16 09/19/2020   ALT 7 09/19/2020   ALKPHOS 89 09/19/2020   BILITOT 0.2 (L) 09/19/2020   GFRNONAA >60 09/19/2020   GFRAA >60 10/17/2019    Lab Results  Component Value Date   CEA1 8.66 (H) 07/02/2020   CEA 10.28 (H) 09/06/2020     Medications: I have reviewed the patient's current medications.   Assessment/Plan: Sigmoid colon cancer, stage IV (pT4a,pN2b,M1c) Colonoscopy 03/24/2019-3 rectal polyps-hyperplastic polyps, distal colon biopsy-at least intramucosal  adenocarcinoma, completely obstructing mass in the distal sigmoid colon, could not be traversed, intact mismatch repair protein expression 03/24/2019-CEA 56.1 03/29/2019 CT abdomen/pelvis-circumferential thickening involving the entire mid and distal sigmoid colon to the level of the rectosigmoid junction, solid/cystic mass in the left ovary, bilateral nephrolithiasis Robotic assisted low anterior resection, mesenteric lymphadenectomy, bilateral salpingo-oophorectomy 04/08/2019 Pathology ([facility] review of outside pathology) metastatic adenocarcinoma involving the peritoneum overlying the round ligament, serosa of the urinary bladder, serosa of the right ureter a sacral area, pelvic peritoneum, and left ovary.  Omental biopsy with focal mucin pools with no carcinoma cells identified, right hemidiaphragm biopsy involved by metastatic adenocarcinoma, invasive adenocarcinoma the sigmoid colon, moderately differentiated, T4a, perineural and vascular invasion present, 7/22 lymph nodes, multiple tumor deposits, resection margins negative Negative for PD-L1, low probability of MSI-high, HER-2 negative, negative for BRAF, NRAS and KRAS alterations CTs 05/19/2019-no evidence of metastatic disease, findings suspicious for colitis of the transverse and ascending colon, small amount of ascites in the cul-de-sac, bilateral renal calculi Cycle 1 FOLFIRI 05/24/2019, bevacizumab added with cycle 2 Cycle 4 FOLFIRI/bevacizumab 07/05/2019 Cycle 5 FOLFIRI 07/27/2019, bevacizumab held secondary to hypertension Cycle 6 FOLFIRI 08/10/1999, bevacizumab held secondary to hypertension CTs 08/30/2019-no evidence of recurrent disease, new subsolid right lower lobe nodule felt to be inflammatory, emphysema Cycle 7 FOLFIRI 09/26/2019, bevacizumab remains on hold secondary to hypertension Cycle 8 FOLFIRI 10/17/2019, bevacizumab held secondary to hypertension, Udenyca added for neutropenia Cycle 9 FOLFIRI 11/07/2019,  bevacizumab held secondary to  hypertension, Udenyca Cycle 10 FOLFIRI 11/28/2019, bevacizumab held, Udenyca Cycle 11 FOLFIRI 12/26/2019, bevacizumab held, Udenyca CTs 01/25/2020-no evidence of recurrent disease, resolution of right lower lobe nodule Maintenance Xeloda beginning 02/06/2020 CTs 04/25/2020- no evidence of metastatic disease, multiple bilateral renal calculi without hydronephrosis Maintenance Xeloda continued CT abdomen/pelvis without contrast 08/10/2020-bilateral staghorn renal calculi, no evidence of metastatic disease; addendum 08/23/2020-small but increasing omental nodules. Cycle 1 FOLFIRI/panitumumab 09/19/2020   Hypertension G4 P3, twins Kidney stones-bilateral staghorn renal calculi on CT 08/10/2020 Infected Port-A-Cath 09/12/2019-placed on Augmentin, referred for Port-A-Cath removal; Port-A-Cath removed 09/14/2019; PICC line placed 09/14/2019; culture staph aureus.  Course of Septra completed. Neutropenia secondary to chemotherapy-Udenyca added with cycle 8 FOLFIRI Right nephrostomy tube 09/12/2020        Disposition: April [name] appears stable.  She has progressive metastatic colon cancer.  She will begin treatment with FOLFIRI/panitumumab today.  We again reviewed potential toxicities associated with this regimen.  We discussed the potential for diarrhea and a skin rash.  She agrees to proceed.  She will begin doxycycline prophylaxis. She underwent placement of a nephrostomy tube on 09/12/2020.  She will follow-up at [facility] urology tomorrow for management of the nephrostomy tube and to discuss treatment of the renal stones.  The creatinine is normal today.  April [name] will return for an office visit and chemotherapy in 2 weeks.  Gary Sherrill, MD  09/19/2020  10:05 AM    

## 2020-09-19 NOTE — Progress Notes (Signed)
Patient presents for treatment. RN assessment completed along with the following:  Labs reviewed - YES Vitals reviewed including weight - YES Oncology Treatment Attestation completed - YES Consent completed and reflects current therapy/intent - YES MD/APP progress note reviewed - YES Treatment plan/orders reviewed - YES Last treatment date reviewed and appropriate      amount of time has elapsed between treatments. YES  Patient to proceed with treatment.

## 2020-09-19 NOTE — Progress Notes (Signed)
Patient identified to be at risk for malnutrition on the MST secondary to wt loss and poor appetite.  45 year old female diagnosed with Colorectal cancer and followed by Dr. Benay Spice.  PMH includes HTN and Renal calculi.  Medications include Xeloda, linzess, and Zofran.  Labs include Glucose 111.  Height: 5'7". Weight:160.8 pounds. UBW: 163.4 pounds. BMI: 25.18.  Patient typically has constipation. She reports some intermittent abdominal pain. S/P nephrostomy tube at [facility] August 31. Patient has a poor appetite with chemotherapy. Reports dairy hurts her stomach. She drinks Boost and ensure plus at least once daily. Reports she changed her diet to eliminate red meat and increased fruits and vegetables.  Nutrition Diagnosis: Unintended wt loss related to colorectal cancer as evidenced by 2.6 pound wt loss from usual body weight which is not significant.  Intervention: Educated to consume small, frequent meals with adequate calories and protein. Continue oral nutrition supplements as needed. Provided coupons. Consider lactaid dairy products. Provided fact sheets, recipes, and aicr.org website.  Monitoring, Evaluation, Goals:  Tolerate adequate calories and protein for weight maintenance.  No follow up scheduled. Patient to contact RD with questions.

## 2020-09-20 ENCOUNTER — Encounter: Payer: Self-pay | Admitting: Oncology

## 2020-09-20 NOTE — Telephone Encounter (Signed)
24 Hour Callback 24 hour call back after first time vecabix. Pt states she has had a little nausea but has taken nausea medication and feeling a little weak. Pt states she will call with questions or problems

## 2020-09-21 ENCOUNTER — Other Ambulatory Visit: Payer: Self-pay | Admitting: Nurse Practitioner

## 2020-09-21 ENCOUNTER — Inpatient Hospital Stay: Payer: Medicaid Other

## 2020-09-21 ENCOUNTER — Other Ambulatory Visit: Payer: Self-pay

## 2020-09-21 VITALS — BP 130/100 | HR 67 | Temp 98.5°F | Resp 18

## 2020-09-21 DIAGNOSIS — C19 Malignant neoplasm of rectosigmoid junction: Secondary | ICD-10-CM

## 2020-09-21 DIAGNOSIS — R110 Nausea: Secondary | ICD-10-CM

## 2020-09-21 DIAGNOSIS — Z5111 Encounter for antineoplastic chemotherapy: Secondary | ICD-10-CM | POA: Diagnosis not present

## 2020-09-21 MED ORDER — PEGFILGRASTIM-CBQV 6 MG/0.6ML ~~LOC~~ SOSY
6.0000 mg | PREFILLED_SYRINGE | Freq: Once | SUBCUTANEOUS | Status: AC
  Administered 2020-09-21: 6 mg via SUBCUTANEOUS

## 2020-09-21 MED ORDER — PROCHLORPERAZINE EDISYLATE 10 MG/2ML IJ SOLN
10.0000 mg | Freq: Once | INTRAMUSCULAR | Status: AC
  Administered 2020-09-21: 10 mg via INTRAVENOUS
  Filled 2020-09-21: qty 2

## 2020-09-21 MED ORDER — SODIUM CHLORIDE 0.9 % IV SOLN: Freq: Once | INTRAVENOUS | Status: AC

## 2020-09-21 MED ORDER — SODIUM CHLORIDE 0.9% FLUSH
10.0000 mL | INTRAVENOUS | Status: DC | PRN
Start: 2020-09-21 — End: 2020-09-21
  Administered 2020-09-21: 10 mL

## 2020-09-21 MED ORDER — SODIUM CHLORIDE 0.9 % IV SOLN
10.0000 mg | Freq: Once | INTRAVENOUS | Status: AC
  Administered 2020-09-21: 10 mg via INTRAVENOUS
  Filled 2020-09-21: qty 1

## 2020-09-21 MED ORDER — HEPARIN SOD (PORK) LOCK FLUSH 100 UNIT/ML IV SOLN
500.0000 [IU] | Freq: Once | INTRAVENOUS | Status: AC | PRN
  Administered 2020-09-21: 500 [IU]

## 2020-09-21 MED ORDER — PROCHLORPERAZINE MALEATE 10 MG PO TABS: 10.0000 mg | ORAL_TABLET | Freq: Four times a day (QID) | ORAL | 2 refills | Status: DC | PRN

## 2020-09-21 NOTE — Patient Instructions (Signed)

## 2020-09-21 NOTE — Progress Notes (Signed)
Patient arrived to infusion room for pump d/c.  Patient with complaints of nausea x 2 days, unable to eat. Patient stated she was taking pills for nausea but was unclear on what medication she was taking.  Patient had son send picture of bottle of medication she was taking at home for nausea. The picture was of doxycycline.  Educated patient on what doxycycline is and educated on what to take for nausea relief at home.  Patient verbalized understanding.   Dr. Benay Spice and Ned Card, NP notified.  Verbal orders received for fluids, IV compazine, and IV decadron (see MAR). Ned Card, NP to order compazine PO for patient to take at home. Per Ned Card, NP patient ok to be discharged when fluids are complete.  VSS upon leaving unit. Patient instructed to call office if nausea worsened or if she had any other questions.  Patient verbalized understanding.

## 2020-09-28 ENCOUNTER — Inpatient Hospital Stay: Payer: Medicaid Other

## 2020-09-28 ENCOUNTER — Other Ambulatory Visit: Payer: Self-pay

## 2020-09-28 DIAGNOSIS — C19 Malignant neoplasm of rectosigmoid junction: Secondary | ICD-10-CM

## 2020-09-28 DIAGNOSIS — Z95828 Presence of other vascular implants and grafts: Secondary | ICD-10-CM

## 2020-09-28 DIAGNOSIS — Z5111 Encounter for antineoplastic chemotherapy: Secondary | ICD-10-CM | POA: Diagnosis not present

## 2020-09-28 MED ORDER — HEPARIN SOD (PORK) LOCK FLUSH 100 UNIT/ML IV SOLN
500.0000 [IU] | Freq: Once | INTRAVENOUS | Status: AC
  Administered 2020-09-28: 250 [IU]

## 2020-09-28 MED ORDER — SODIUM CHLORIDE 0.9% FLUSH
10.0000 mL | Freq: Once | INTRAVENOUS | Status: AC
  Administered 2020-09-28: 10 mL

## 2020-09-28 NOTE — Patient Instructions (Signed)
PICC Home Care Guide A peripherally inserted central catheter (PICC) is a form of IV access that allows medicines and IV fluids to be quickly distributed throughout the body. The PICC is a long, thin, flexible tube (catheter) that is inserted into a vein in the upper arm. The catheter ends in a large vein in the chest (superior vena cava, or SVC). After the PICC is inserted, a chest X-ray may be done to make sure that it is in the correct place. A PICC may be placed for different reasons, such as: To give medicines and liquid nutrition. To give IV fluids and blood products. If there is trouble placing a peripheral intravenous (PIV) catheter. If taken care of properly, a PICC can remain in place for several months. Having a PICC can also allow a person to go home from the hospital sooner. Medicine and PICC care can be managed at home by a family member, caregiver, or home health care team. What are the risks? Generally, having a PICC is safe. However, problems may occur, including: A blood clot (thrombus) forming in or at the tip of the PICC. A blood clot forming in a vein (deep vein thrombosis) or traveling to the lung (pulmonary embolism). Inflammation of the vein (phlebitis) in which the PICC is placed. Infection. Central line associated blood stream infection (CLABSI) is a serious infection that often requires hospitalization. PICC movement (malposition). The PICC tip may move from its original position due to excessive physical activity, forceful coughing, sneezing, or vomiting. A break or cut in the PICC. It is important not to use scissors near the PICC. Nerve or tendon irritation or injury during PICC insertion. How to take care of your PICC Preventing problems You and any caregivers should wash your hands often with soap. Wash hands: Before touching the PICC line or the infusion device. Before changing a bandage (dressing). Flush the PICC as told by your health care provider. Let your  health care provider know right away if the PICC is hard to flush or does not flush. Do not use force to flush the PICC. Do not use a syringe that is less than 10 mL to flush the PICC. Avoid blood pressure checks on the arm in which the PICC is placed. Never pull or tug on the PICC. Do not take the PICC out yourself. Only a trained clinical professional should remove the PICC. Use clean and sterile supplies only. Keep the supplies in a dry place. Do not reuse needles, syringes, or any other supplies. Doing that can lead to infection. Keep pets and children away from your PICC line. Check the PICC insertion site every day for signs of infection. Check for: Leakage. Redness, swelling, or pain. Fluid or blood. Warmth. Pus or a bad smell. PICC dressing care Keep your PICC bandage (dressing) clean and dry to prevent infection. Do not take baths, swim, or use a hot tub until your health care provider approves. Ask your health care provider if you can take showers. You may only be allowed to take sponge baths for bathing. When you are allowed to shower: Ask your health care provider to teach you how to wrap the PICC line. Cover the PICC line with clear plastic wrap and tape to keep it dry while showering. Follow instructions from your health care provider about how to take care of your insertion site and dressing. Make sure you: Wash your hands with soap and water before you change your bandage (dressing). If soap and water are  not available, use hand sanitizer. Change your dressing as told by your health care provider. Leave stitches (sutures), skin glue, or adhesive strips in place. These skin closures may need to stay in place for 2 weeks or longer. If adhesive strip edges start to loosen and curl up, you may trim the loose edges. Do not remove adhesive strips completely unless your health care provider tells you to do that. Change your PICC dressing if it becomes loose or wet. General  instructions  Carry your PICC identification card or wear a medical alert bracelet at all times. Keep the tube clamped at all times, unless it is being used. Carry a smooth-edge clamp with you at all times to place on the tube if it breaks. Do not use scissors or sharp objects near the tube. You may bend your arm and move it freely. If your PICC is near or at the bend of your elbow, avoid activity with repeated motion at the elbow. Avoid lifting heavy objects as told by your health care provider. Keep all follow-up visits as told by your health care provider. This is important. Disposal of supplies Throw away any syringes in a disposal container that is meant for sharp items (sharps container). You can buy a sharps container from a pharmacy, or you can make one by using an empty hard plastic bottle with a cover. Place any used dressings or infusion bags into a plastic bag. Throw that bag in the trash. Contact a health care provider if: You have pain in your arm, ear, face, or teeth. You have a fever or chills. You have redness, swelling, or pain around the insertion site. You have fluid or blood coming from the insertion site. Your insertion site feels warm to the touch. You have pus or a bad smell coming from the insertion site. Your skin feels hard and raised around the insertion site. Get help right away if: Your PICC is accidentally pulled all the way out. If this happens, cover the insertion site with a bandage or gauze dressing. Do not throw the PICC away. Your health care provider will need to check it. Your PICC was tugged or pulled and has partially come out. Do not  push the PICC back in. You cannot flush the PICC, it is hard to flush, or the PICC leaks around the insertion site when it is flushed. You hear a "flushing" sound when the PICC is flushed. You feel your heart racing or skipping beats. There is a hole or tear in the PICC. You have swelling in the arm in which the PICC  was inserted. You have a red streak going up your arm from where the PICC was inserted. Summary A peripherally inserted central catheter (PICC) is a long, thin, flexible tube (catheter) that is inserted into a vein in the upper arm. The PICC is inserted using a sterile technique by a specially trained nurse or physician. Only a trained clinical professional should remove it. Keep your PICC identification card with you at all times. Avoid blood pressure checks on the arm in which the PICC is placed. If cared for properly, a PICC can remain in place for several months. Having a PICC can also allow a person to go home from the hospital sooner. This information is not intended to replace advice given to you by your health care provider. Make sure you discuss any questions you have with your health care provider. Document Revised: 05/07/2019 Document Reviewed: 05/11/2019 Elsevier Patient Education  2022  [employer].

## 2020-09-30 ENCOUNTER — Other Ambulatory Visit: Payer: Self-pay | Admitting: Oncology

## 2020-10-01 ENCOUNTER — Other Ambulatory Visit: Payer: Self-pay | Admitting: Oncology

## 2020-10-02 ENCOUNTER — Other Ambulatory Visit (HOSPITAL_COMMUNITY): Payer: Self-pay

## 2020-10-02 MED ORDER — IBUPROFEN 800 MG PO TABS
800.0000 mg | ORAL_TABLET | Freq: Every day | ORAL | 0 refills | Status: DC | PRN
  Filled 2020-10-02: qty 30, 30d supply, fill #0

## 2020-10-03 ENCOUNTER — Other Ambulatory Visit (HOSPITAL_BASED_OUTPATIENT_CLINIC_OR_DEPARTMENT_OTHER): Payer: Self-pay

## 2020-10-03 ENCOUNTER — Telehealth: Payer: Self-pay

## 2020-10-03 ENCOUNTER — Encounter: Payer: Self-pay | Admitting: Nurse Practitioner

## 2020-10-03 ENCOUNTER — Other Ambulatory Visit: Payer: Self-pay

## 2020-10-03 ENCOUNTER — Inpatient Hospital Stay: Payer: Medicaid Other

## 2020-10-03 ENCOUNTER — Inpatient Hospital Stay (HOSPITAL_BASED_OUTPATIENT_CLINIC_OR_DEPARTMENT_OTHER): Payer: Medicaid Other | Admitting: Nurse Practitioner

## 2020-10-03 VITALS — BP 150/80 | HR 87 | Temp 98.1°F | Resp 18 | Ht 67.0 in | Wt 157.2 lb

## 2020-10-03 VITALS — BP 132/80 | Resp 18

## 2020-10-03 DIAGNOSIS — C19 Malignant neoplasm of rectosigmoid junction: Secondary | ICD-10-CM

## 2020-10-03 DIAGNOSIS — Z5111 Encounter for antineoplastic chemotherapy: Secondary | ICD-10-CM | POA: Diagnosis not present

## 2020-10-03 LAB — CMP (CANCER CENTER ONLY)
ALT: 8 U/L (ref 0–44)
AST: 12 U/L — ABNORMAL LOW (ref 15–41)
Albumin: 4 g/dL (ref 3.5–5.0)
Alkaline Phosphatase: 132 U/L — ABNORMAL HIGH (ref 38–126)
Anion gap: 7 (ref 5–15)
BUN: 9 mg/dL (ref 6–20)
CO2: 32 mmol/L (ref 22–32)
Calcium: 9.7 mg/dL (ref 8.9–10.3)
Chloride: 101 mmol/L (ref 98–111)
Creatinine: 0.59 mg/dL (ref 0.44–1.00)
GFR, Estimated: >60 mL/min (ref >60)
Glucose, Bld: 95 mg/dL (ref 70–99)
Potassium: 3.2 mmol/L — ABNORMAL LOW (ref 3.5–5.1)
Sodium: 140 mmol/L (ref 135–145)
Total Bilirubin: 0.2 mg/dL — ABNORMAL LOW (ref 0.3–1.2)
Total Protein: 6.7 g/dL (ref 6.5–8.1)

## 2020-10-03 LAB — CBC WITH DIFFERENTIAL (CANCER CENTER ONLY)
Abs Immature Granulocytes: 0.12 10*3/uL — ABNORMAL HIGH (ref 0.00–0.07)
Basophils Absolute: 0 10*3/uL (ref 0.0–0.1)
Basophils Relative: 0 %
Eosinophils Absolute: 0.3 10*3/uL (ref 0.0–0.5)
Eosinophils Relative: 4 %
HCT: 36.7 % (ref 36.0–46.0)
Hemoglobin: 12.2 g/dL (ref 12.0–15.0)
Immature Granulocytes: 1 %
Lymphocytes Relative: 26 %
Lymphs Abs: 2.5 10*3/uL (ref 0.7–4.0)
MCH: 31.4 pg (ref 26.0–34.0)
MCHC: 33.2 g/dL (ref 30.0–36.0)
MCV: 94.6 fL (ref 80.0–100.0)
Monocytes Absolute: 0.7 10*3/uL (ref 0.1–1.0)
Monocytes Relative: 7 %
Neutro Abs: 6.2 10*3/uL (ref 1.7–7.7)
Neutrophils Relative %: 62 %
Platelet Count: 266 10*3/uL (ref 150–400)
RBC: 3.88 MIL/uL (ref 3.87–5.11)
RDW: 12.6 % (ref 11.5–15.5)
WBC Count: 9.8 10*3/uL (ref 4.0–10.5)
nRBC: 0 % (ref 0.0–0.2)

## 2020-10-03 LAB — MAGNESIUM: Magnesium: 1.7 mg/dL (ref 1.7–2.4)

## 2020-10-03 MED ORDER — ONDANSETRON HCL 4 MG PO TABS: 4.0000 mg | ORAL_TABLET | Freq: Three times a day (TID) | ORAL | 2 refills | Status: DC | PRN

## 2020-10-03 MED ORDER — SODIUM CHLORIDE 0.9 % IV SOLN
180.0000 mg/m2 | Freq: Once | INTRAVENOUS | Status: AC
  Administered 2020-10-03: 340 mg via INTRAVENOUS
  Filled 2020-10-03: qty 5

## 2020-10-03 MED ORDER — DEXAMETHASONE 4 MG PO TABS: 4.0000 mg | ORAL_TABLET | Freq: Two times a day (BID) | ORAL | 1 refills | Status: DC

## 2020-10-03 MED ORDER — SODIUM CHLORIDE 0.9 % IV SOLN: Freq: Once | INTRAVENOUS | Status: AC

## 2020-10-03 MED ORDER — DIPHENOXYLATE-ATROPINE 2.5-0.025 MG PO TABS: 1.0000 | ORAL_TABLET | Freq: Four times a day (QID) | ORAL | 0 refills | Status: DC | PRN

## 2020-10-03 MED ORDER — FLUOROURACIL CHEMO INJECTION 2.5 GM/50ML
400.0000 mg/m2 | Freq: Once | INTRAVENOUS | Status: AC
  Administered 2020-10-03: 750 mg via INTRAVENOUS
  Filled 2020-10-03: qty 15

## 2020-10-03 MED ORDER — ATROPINE SULFATE 1 MG/ML IJ SOLN
0.5000 mg | Freq: Once | INTRAMUSCULAR | Status: AC
  Administered 2020-10-03: 0.5 mg via INTRAVENOUS
  Filled 2020-10-03: qty 1

## 2020-10-03 MED ORDER — SODIUM CHLORIDE 0.9 % IV SOLN
10.0000 mg | Freq: Once | INTRAVENOUS | Status: AC
  Administered 2020-10-03: 10 mg via INTRAVENOUS
  Filled 2020-10-03: qty 1

## 2020-10-03 MED ORDER — SODIUM CHLORIDE 0.9 % IV SOLN
400.0000 mg/m2 | Freq: Once | INTRAVENOUS | Status: AC
  Administered 2020-10-03: 744 mg via INTRAVENOUS
  Filled 2020-10-03: qty 37.2

## 2020-10-03 MED ORDER — SODIUM CHLORIDE 0.9 % IV SOLN
150.0000 mg | Freq: Once | INTRAVENOUS | Status: AC
  Administered 2020-10-03: 150 mg via INTRAVENOUS
  Filled 2020-10-03: qty 5

## 2020-10-03 MED ORDER — SODIUM CHLORIDE 0.9 % IV SOLN
2400.0000 mg/m2 | INTRAVENOUS | Status: DC
  Administered 2020-10-03: 4450 mg via INTRAVENOUS
  Filled 2020-10-03: qty 89

## 2020-10-03 MED ORDER — PALONOSETRON HCL INJECTION 0.25 MG/5ML
0.2500 mg | Freq: Once | INTRAVENOUS | Status: AC
  Administered 2020-10-03: 0.25 mg via INTRAVENOUS
  Filled 2020-10-03: qty 5

## 2020-10-03 MED ORDER — SODIUM CHLORIDE 0.9% FLUSH: 10.0000 mL | INTRAVENOUS | Status: DC | PRN

## 2020-10-03 MED ORDER — ATROPINE SULFATE 1 MG/ML IJ SOLN
0.5000 mg | Freq: Once | INTRAMUSCULAR | Status: AC
  Administered 2020-10-03: 0.5 mg via INTRAVENOUS

## 2020-10-03 MED ORDER — SODIUM CHLORIDE 0.9 % IV SOLN
6.0000 mg/kg | Freq: Once | INTRAVENOUS | Status: AC
  Administered 2020-10-03: 400 mg via INTRAVENOUS
  Filled 2020-10-03: qty 20

## 2020-10-03 NOTE — Patient Instructions (Addendum)
The chemotherapy medication bag should finish at 46 hours, 96 hours, or 7 days. For example, if your pump is scheduled for 46 hours and it was put on at 4:00 p.m., it should finish at 2:00 p.m. the day it is scheduled to come off regardless of your appointment time.     Estimated time to finish at 10:30am on Friday September 10/05/20. If the display on your pump reads "Low Volume" and it is beeping, take the batteries out of the pump and come to the cancer center for it to be taken off.   If the pump alarms go off prior to the pump reading "Low Volume" then call [phone] and someone can assist you.  If the plunger comes out and the chemotherapy medication is leaking out, please use your home chemo spill kit to clean up the spill. Do NOT use paper towels or other household products.  If you have problems or questions regarding your pump, please call either [phone] (24 hours a day) or the cancer center Monday-Friday 8:00 a.m.- 4:30 p.m. at the clinic number and we will assist you. If you are unable to get assistance, then go to the nearest Emergency Department and ask the staff to contact the IV team for assistance.  [facility]  Discharge Instructions: Thank you for choosing [facility] to provide your oncology and hematology care.   If you have a lab appointment with the [facility], please go directly to the [facility] and check in at the registration area.   Wear comfortable clothing and clothing appropriate for easy access to any Portacath or PICC line.   We strive to give you quality time with your provider. You may need to reschedule your appointment if you arrive late (15 or more minutes).  Arriving late affects you and other patients whose appointments are after yours.  Also, if you miss three or more appointments without notifying the office, you may be dismissed from the clinic at the provider's discretion.      For prescription  refill requests, have your pharmacy contact our office and allow 72 hours for refills to be completed.    Today you received the following chemotherapy and/or immunotherapy agents panitumumab, irinotecan, leucovorin, fluorouracil    To help prevent nausea and vomiting after your treatment, we encourage you to take your nausea medication as directed.  BELOW ARE SYMPTOMS THAT SHOULD BE REPORTED IMMEDIATELY: *FEVER GREATER THAN 100.4 F (38 C) OR HIGHER *CHILLS OR SWEATING *NAUSEA AND VOMITING THAT IS NOT CONTROLLED WITH YOUR NAUSEA MEDICATION *UNUSUAL SHORTNESS OF BREATH *UNUSUAL BRUISING OR BLEEDING *URINARY PROBLEMS (pain or burning when urinating, or frequent urination) *BOWEL PROBLEMS (unusual diarrhea, constipation, pain near the anus) TENDERNESS IN MOUTH AND THROAT WITH OR WITHOUT PRESENCE OF ULCERS (sore throat, sores in mouth, or a toothache) UNUSUAL RASH, SWELLING OR PAIN  UNUSUAL VAGINAL DISCHARGE OR ITCHING   Items with * indicate a potential emergency and should be followed up as soon as possible or go to the Emergency Department if any problems should occur.  Please show the CHEMOTHERAPY ALERT CARD or IMMUNOTHERAPY ALERT CARD at check-in to the Emergency Department and triage nurse.  Should you have questions after your visit or need to cancel or reschedule your appointment, please contact [facility]  Dept: [phone]  and follow the prompts.  Office hours are 8:00 a.m. to 4:30 p.m. Monday - Friday. Please note that voicemails left after 4:00 p.m.  may not be returned until the following business day.  We are closed weekends and major holidays. You have access to a nurse at all times for urgent questions. Please call the main number to the clinic Dept: [phone] and follow the prompts.   For any non-urgent questions, you may also contact your provider using MyChart. We now offer e-Visits for anyone 35 and older to request care online for non-urgent  symptoms. For details visit [email or web address].   Also download the MyChart app! Go to the app store, search "MyChart", open the app, select [facility], and log in with your MyChart username and password.  Due to Covid, a mask is required upon entering the hospital/clinic. If you do not have a mask, one will be given to you upon arrival. For doctor visits, patients may have 1 support person aged 10 or older with them. For treatment visits, patients cannot have anyone with them due to current Covid guidelines and our immunocompromised population.   Panitumumab Solution for Injection What is this medication? PANITUMUMAB (pan i TOOM ue mab) is a monoclonal antibody. It is used to treat colorectal cancer. This medicine may be used for other purposes; ask your health care provider or pharmacist if you have questions. COMMON BRAND NAME(S): Vectibix What should I tell my care team before I take this medication? They need to know if you have any of these conditions: eye disease, vision problems low levels of calcium, magnesium, or potassium in the blood lung or breathing disease, like asthma skin conditions or sensitivity an unusual or allergic reaction to panitumumab, other medicines, foods, dyes, or preservatives pregnant or trying to get pregnant breast-feeding How should I use this medication? This drug is given as an infusion into a vein. It is administered in a hospital or clinic by a specially trained health care professional. Talk to your pediatrician regarding the use of this medicine in children. Special care may be needed. Overdosage: If you think you have taken too much of this medicine contact a poison control center or emergency room at once. NOTE: This medicine is only for you. Do not share this medicine with others. What if I miss a dose? It is important not to miss your dose. Call your doctor or health care professional if you are unable to keep an appointment. What may  interact with this medication? Do not take this medicine with any of the following medications: bevacizumab This list may not describe all possible interactions. Give your health care provider a list of all the medicines, herbs, non-prescription drugs, or dietary supplements you use. Also tell them if you smoke, drink alcohol, or use illegal drugs. Some items may interact with your medicine. What should I watch for while using this medication? Visit your doctor for checks on your progress. This drug may make you feel generally unwell. This is not uncommon, as chemotherapy can affect healthy cells as well as cancer cells. Report any side effects. Continue your course of treatment even though you feel ill unless your doctor tells you to stop. This medicine can make you more sensitive to the sun. Keep out of the sun while receiving this medicine and for 2 months after the last dose. If you cannot avoid being in the sun, wear protective clothing and use sunscreen. Do not use sun lamps or tanning beds/booths. In some cases, you may be given additional medicines to help with side effects. Follow all directions for their use. Call your doctor or health care professional for  advice if you get a fever, chills or sore throat, or other symptoms of a cold or flu. Do not treat yourself. This drug decreases your body's ability to fight infections. Try to avoid being around people who are sick. Avoid taking products that contain aspirin, acetaminophen, ibuprofen, naproxen, or ketoprofen unless instructed by your doctor. These medicines may hide a fever. Do not become pregnant while taking this medicine and for 2 months after the last dose. Women should inform their doctor if they wish to become pregnant or think they might be pregnant. There is a potential for serious side effects to an unborn child. Talk to your health care professional or pharmacist for more information. Do not breast-feed an infant while taking this  medicine or for 2 months after the last dose. What side effects may I notice from receiving this medication? Side effects that you should report to your doctor or health care professional as soon as possible: allergic reactions like skin rash, itching or hives, swelling of the face, lips, or tongue breathing problems changes in vision eye pain fast, irregular heartbeat fever, chills mouth sores red spots on the skin redness, blistering, peeling or loosening of the skin, including inside the mouth signs and symptoms of kidney injury like trouble passing urine or change in the amount of urine signs and symptoms of low blood pressure like dizziness; feeling faint or lightheaded, falls; unusually weak or tired signs of low calcium like fast heartbeat, muscle cramps or muscle pain; pain, tingling, numbness in the hands or feet; seizures signs and symptoms of low magnesium like muscle cramps, pain, or weakness; tremors; seizures; or fast, irregular heartbeat signs and symptoms of low potassium like muscle cramps or muscle pain; chest pain; dizziness; feeling faint or lightheaded, falls; palpitations; breathing problems; or fast, irregular heartbeat swelling of the ankles, feet, hands Side effects that usually do not require medical attention (report to your doctor or health care professional if they continue or are bothersome): changes in skin like acne, cracks, skin dryness diarrhea eyelash growth headache mouth sores nail changes nausea, vomiting This list may not describe all possible side effects. Call your doctor for medical advice about side effects. You may report side effects to FDA at 1-800-FDA-1088. Where should I keep my medication? This drug is given in a hospital or clinic and will not be stored at home. NOTE: This sheet is a summary. It may not cover all possible information. If you have questions about this medicine, talk to your doctor, pharmacist, or health care provider.   2022 Elsevier/Gold Standard (2015-07-20 16:45:04)  Irinotecan injection What is this medication? IRINOTECAN (ir in oh TEE kan ) is a chemotherapy drug. It is used to treat colon and rectal cancer. This medicine may be used for other purposes; ask your health care provider or pharmacist if you have questions. COMMON BRAND NAME(S): Camptosar What should I tell my care team before I take this medication? They need to know if you have any of these conditions: dehydration diarrhea infection (especially a virus infection such as chickenpox, cold sores, or herpes) liver disease low blood counts, like low white cell, platelet, or red cell counts low levels of calcium, magnesium, or potassium in the blood recent or ongoing radiation therapy an unusual or allergic reaction to irinotecan, other medicines, foods, dyes, or preservatives pregnant or trying to get pregnant breast-feeding How should I use this medication? This drug is given as an infusion into a vein. It is administered in a hospital  or clinic by a specially trained health care professional. Talk to your pediatrician regarding the use of this medicine in children. Special care may be needed. Overdosage: If you think you have taken too much of this medicine contact a poison control center or emergency room at once. NOTE: This medicine is only for you. Do not share this medicine with others. What if I miss a dose? It is important not to miss your dose. Call your doctor or health care professional if you are unable to keep an appointment. What may interact with this medication? Do not take this medicine with any of the following medications: cobicistat itraconazole This medicine may interact with the following medications: antiviral medicines for HIV or AIDS certain antibiotics like rifampin or rifabutin certain medicines for fungal infections like ketoconazole, posaconazole, and voriconazole certain medicines for seizures like  carbamazepine, phenobarbital, phenotoin clarithromycin gemfibrozil nefazodone St. John's Wort This list may not describe all possible interactions. Give your health care provider a list of all the medicines, herbs, non-prescription drugs, or dietary supplements you use. Also tell them if you smoke, drink alcohol, or use illegal drugs. Some items may interact with your medicine. What should I watch for while using this medication? Your condition will be monitored carefully while you are receiving this medicine. You will need important blood work done while you are taking this medicine. This drug may make you feel generally unwell. This is not uncommon, as chemotherapy can affect healthy cells as well as cancer cells. Report any side effects. Continue your course of treatment even though you feel ill unless your doctor tells you to stop. In some cases, you may be given additional medicines to help with side effects. Follow all directions for their use. You may get drowsy or dizzy. Do not drive, use machinery, or do anything that needs mental alertness until you know how this medicine affects you. Do not stand or sit up quickly, especially if you are an older patient. This reduces the risk of dizzy or fainting spells. Call your health care professional for advice if you get a fever, chills, or sore throat, or other symptoms of a cold or flu. Do not treat yourself. This medicine decreases your body's ability to fight infections. Try to avoid being around people who are sick. Avoid taking products that contain aspirin, acetaminophen, ibuprofen, naproxen, or ketoprofen unless instructed by your doctor. These medicines may hide a fever. This medicine may increase your risk to bruise or bleed. Call your doctor or health care professional if you notice any unusual bleeding. Be careful brushing and flossing your teeth or using a toothpick because you may get an infection or bleed more easily. If you have any  dental work done, tell your dentist you are receiving this medicine. Do not become pregnant while taking this medicine or for 6 months after stopping it. Women should inform their health care professional if they wish to become pregnant or think they might be pregnant. Men should not father a child while taking this medicine and for 3 months after stopping it. There is potential for serious side effects to an unborn child. Talk to your health care professional for more information. Do not breast-feed an infant while taking this medicine or for 7 days after stopping it. This medicine has caused ovarian failure in some women. This medicine may make it more difficult to get pregnant. Talk to your health care professional if you are concerned about your fertility. This medicine has caused decreased  sperm counts in some men. This may make it more difficult to father a child. Talk to your health care professional if you are concerned about your fertility. What side effects may I notice from receiving this medication? Side effects that you should report to your doctor or health care professional as soon as possible: allergic reactions like skin rash, itching or hives, swelling of the face, lips, or tongue chest pain diarrhea flushing, runny nose, sweating during infusion low blood counts - this medicine may decrease the number of white blood cells, red blood cells and platelets. You may be at increased risk for infections and bleeding. nausea, vomiting pain, swelling, warmth in the leg signs of decreased platelets or bleeding - bruising, pinpoint red spots on the skin, black, tarry stools, blood in the urine signs of infection - fever or chills, cough, sore throat, pain or difficulty passing urine signs of decreased red blood cells - unusually weak or tired, fainting spells, lightheadedness Side effects that usually do not require medical attention (report to your doctor or health care professional if they  continue or are bothersome): constipation hair loss headache loss of appetite mouth sores stomach pain This list may not describe all possible side effects. Call your doctor for medical advice about side effects. You may report side effects to FDA at 1-800-FDA-1088. Where should I keep my medication? This drug is given in a hospital or clinic and will not be stored at home. NOTE: This sheet is a summary. It may not cover all possible information. If you have questions about this medicine, talk to your doctor, pharmacist, or health care provider.  2022 Elsevier/Gold Standard (2018-11-30 17:46:13)  Leucovorin injection What is this medication? LEUCOVORIN (loo koe VOR in) is used to prevent or treat the harmful effects of some medicines. This medicine is used to treat anemia caused by a low amount of folic acid in the body. It is also used with 5-fluorouracil (5-FU) to treat colon cancer. This medicine may be used for other purposes; ask your health care provider or pharmacist if you have questions. What should I tell my care team before I take this medication? They need to know if you have any of these conditions: anemia from low levels of vitamin B-12 in the blood an unusual or allergic reaction to leucovorin, folic acid, other medicines, foods, dyes, or preservatives pregnant or trying to get pregnant breast-feeding How should I use this medication? This medicine is for injection into a muscle or into a vein. It is given by a health care professional in a hospital or clinic setting. Talk to your pediatrician regarding the use of this medicine in children. Special care may be needed. Overdosage: If you think you have taken too much of this medicine contact a poison control center or emergency room at once. NOTE: This medicine is only for you. Do not share this medicine with others. What if I miss a dose? This does not apply. What may interact with this  medication? capecitabine fluorouracil phenobarbital phenytoin primidone trimethoprim-sulfamethoxazole This list may not describe all possible interactions. Give your health care provider a list of all the medicines, herbs, non-prescription drugs, or dietary supplements you use. Also tell them if you smoke, drink alcohol, or use illegal drugs. Some items may interact with your medicine. What should I watch for while using this medication? Your condition will be monitored carefully while you are receiving this medicine. This medicine may increase the side effects of 5-fluorouracil, 5-FU. Tell your doctor  or health care professional if you have diarrhea or mouth sores that do not get better or that get worse. What side effects may I notice from receiving this medication? Side effects that you should report to your doctor or health care professional as soon as possible: allergic reactions like skin rash, itching or hives, swelling of the face, lips, or tongue breathing problems fever, infection mouth sores unusual bleeding or bruising unusually weak or tired Side effects that usually do not require medical attention (report to your doctor or health care professional if they continue or are bothersome): constipation or diarrhea loss of appetite nausea, vomiting This list may not describe all possible side effects. Call your doctor for medical advice about side effects. You may report side effects to FDA at 1-800-FDA-1088. Where should I keep my medication? This drug is given in a hospital or clinic and will not be stored at home. NOTE: This sheet is a summary. It may not cover all possible information. If you have questions about this medicine, talk to your doctor, pharmacist, or health care provider.  2022 Elsevier/Gold Standard (2007-07-06 16:50:29)  Fluorouracil, 5-FU injection What is this medication? FLUOROURACIL, 5-FU (flure oh YOOR a sil) is a chemotherapy drug. It slows the growth  of cancer cells. This medicine is used to treat many types of cancer like breast cancer, colon or rectal cancer, pancreatic cancer, and stomach cancer. This medicine may be used for other purposes; ask your health care provider or pharmacist if you have questions. COMMON BRAND NAME(S): Adrucil What should I tell my care team before I take this medication? They need to know if you have any of these conditions: blood disorders dihydropyrimidine dehydrogenase (DPD) deficiency infection (especially a virus infection such as chickenpox, cold sores, or herpes) kidney disease liver disease malnourished, poor nutrition recent or ongoing radiation therapy an unusual or allergic reaction to fluorouracil, other chemotherapy, other medicines, foods, dyes, or preservatives pregnant or trying to get pregnant breast-feeding How should I use this medication? This drug is given as an infusion or injection into a vein. It is administered in a hospital or clinic by a specially trained health care professional. Talk to your pediatrician regarding the use of this medicine in children. Special care may be needed. Overdosage: If you think you have taken too much of this medicine contact a poison control center or emergency room at once. NOTE: This medicine is only for you. Do not share this medicine with others. What if I miss a dose? It is important not to miss your dose. Call your doctor or health care professional if you are unable to keep an appointment. What may interact with this medication? Do not take this medicine with any of the following medications: live virus vaccines This medicine may also interact with the following medications: medicines that treat or prevent blood clots like warfarin, enoxaparin, and dalteparin This list may not describe all possible interactions. Give your health care provider a list of all the medicines, herbs, non-prescription drugs, or dietary supplements you use. Also tell  them if you smoke, drink alcohol, or use illegal drugs. Some items may interact with your medicine. What should I watch for while using this medication? Visit your doctor for checks on your progress. This drug may make you feel generally unwell. This is not uncommon, as chemotherapy can affect healthy cells as well as cancer cells. Report any side effects. Continue your course of treatment even though you feel ill unless your doctor tells you  to stop. In some cases, you may be given additional medicines to help with side effects. Follow all directions for their use. Call your doctor or health care professional for advice if you get a fever, chills or sore throat, or other symptoms of a cold or flu. Do not treat yourself. This drug decreases your body's ability to fight infections. Try to avoid being around people who are sick. This medicine may increase your risk to bruise or bleed. Call your doctor or health care professional if you notice any unusual bleeding. Be careful brushing and flossing your teeth or using a toothpick because you may get an infection or bleed more easily. If you have any dental work done, tell your dentist you are receiving this medicine. Avoid taking products that contain aspirin, acetaminophen, ibuprofen, naproxen, or ketoprofen unless instructed by your doctor. These medicines may hide a fever. Do not become pregnant while taking this medicine. Women should inform their doctor if they wish to become pregnant or think they might be pregnant. There is a potential for serious side effects to an unborn child. Talk to your health care professional or pharmacist for more information. Do not breast-feed an infant while taking this medicine. Men should inform their doctor if they wish to father a child. This medicine may lower sperm counts. Do not treat diarrhea with over the counter products. Contact your doctor if you have diarrhea that lasts more than 2 days or if it is severe and  watery. This medicine can make you more sensitive to the sun. Keep out of the sun. If you cannot avoid being in the sun, wear protective clothing and use sunscreen. Do not use sun lamps or tanning beds/booths. What side effects may I notice from receiving this medication? Side effects that you should report to your doctor or health care professional as soon as possible: allergic reactions like skin rash, itching or hives, swelling of the face, lips, or tongue low blood counts - this medicine may decrease the number of white blood cells, red blood cells and platelets. You may be at increased risk for infections and bleeding. signs of infection - fever or chills, cough, sore throat, pain or difficulty passing urine signs of decreased platelets or bleeding - bruising, pinpoint red spots on the skin, black, tarry stools, blood in the urine signs of decreased red blood cells - unusually weak or tired, fainting spells, lightheadedness breathing problems changes in vision chest pain mouth sores nausea and vomiting pain, swelling, redness at site where injected pain, tingling, numbness in the hands or feet redness, swelling, or sores on hands or feet stomach pain unusual bleeding Side effects that usually do not require medical attention (report to your doctor or health care professional if they continue or are bothersome): changes in finger or toe nails diarrhea dry or itchy skin hair loss headache loss of appetite sensitivity of eyes to the light stomach upset unusually teary eyes This list may not describe all possible side effects. Call your doctor for medical advice about side effects. You may report side effects to FDA at 1-800-FDA-1088. Where should I keep my medication? This drug is given in a hospital or clinic and will not be stored at home. NOTE: This sheet is a summary. It may not cover all possible information. If you have questions about this medicine, talk to your doctor,  pharmacist, or health care provider.  2022 Elsevier/Gold Standard (2018-11-30 15:00:03)

## 2020-10-03 NOTE — Progress Notes (Signed)
[facility] OFFICE PROGRESS NOTE   Diagnosis:  Colon cancer  INTERVAL HISTORY:   [name] returns as scheduled. She completed cycle 1 FOLFIRI/panitumumab 09/19/2020.  She developed nausea on day 2.  The nausea lasted until 3 to 4 days ago.  She vomited a few times.  She did not have nausea medicine.  She plans on picking up Compazine today.  She developed diarrhea on day 2.  She did not have an antidiarrheal to take.  The diarrhea is better now.  No mouth sores.  No rash.  Objective:  Vital signs in last 24 hours:  Blood pressure (!) 150/80, pulse 87, temperature 98.1 F (36.7 C), temperature source Oral, resp. rate 18, height 5' 7" (1.702 m), weight 157 lb 3.2 oz (71.3 kg), SpO2 100 %.    HEENT: No thrush or ulcers. Resp: Lungs clear bilaterally. Cardio: Regular rate and rhythm. GI: Abdomen soft and nontender.  No hepatomegaly. Vascular: No leg edema. Neuro: Alert and oriented. Skin: A few acne type lesions at the upper back.  Right flank nephrostomy tube. Right upper extremity PICC without erythema.  Lab Results:  Lab Results  Component Value Date   WBC 9.8 10/03/2020   HGB 12.2 10/03/2020   HCT 36.7 10/03/2020   MCV 94.6 10/03/2020   PLT 266 10/03/2020   NEUTROABS 6.2 10/03/2020    Imaging:  No results found.  Medications: I have reviewed the patient's current medications.  Assessment/Plan: Sigmoid colon cancer, stage IV (pT4a,pN2b,M1c) Colonoscopy 03/24/2019-3 rectal polyps-hyperplastic polyps, distal colon biopsy-at least intramucosal adenocarcinoma, completely obstructing mass in the distal sigmoid colon, could not be traversed, intact mismatch repair protein expression 03/24/2019-CEA 56.1 03/29/2019 CT abdomen/pelvis-circumferential thickening involving the entire mid and distal sigmoid colon to the level of the rectosigmoid junction, solid/cystic mass in the left ovary, bilateral nephrolithiasis Robotic assisted low anterior resection,  mesenteric lymphadenectomy, bilateral salpingo-oophorectomy 04/08/2019 Pathology ([facility] review of outside pathology) metastatic adenocarcinoma involving the peritoneum overlying the round ligament, serosa of the urinary bladder, serosa of the right ureter a sacral area, pelvic peritoneum, and left ovary.  Omental biopsy with focal mucin pools with no carcinoma cells identified, right hemidiaphragm biopsy involved by metastatic adenocarcinoma, invasive adenocarcinoma the sigmoid colon, moderately differentiated, T4a, perineural and vascular invasion present, 7/22 lymph nodes, multiple tumor deposits, resection margins negative Negative for PD-L1, low probability of MSI-high, HER-2 negative, negative for BRAF, NRAS and KRAS alterations CTs 05/19/2019-no evidence of metastatic disease, findings suspicious for colitis of the transverse and ascending colon, small amount of ascites in the cul-de-sac, bilateral renal calculi Cycle 1 FOLFIRI 05/24/2019, bevacizumab added with cycle 2 Cycle 4 FOLFIRI/bevacizumab 07/05/2019 Cycle 5 FOLFIRI 07/27/2019, bevacizumab held secondary to hypertension Cycle 6 FOLFIRI 08/10/1999, bevacizumab held secondary to hypertension CTs 08/30/2019-no evidence of recurrent disease, new subsolid right lower lobe nodule felt to be inflammatory, emphysema Cycle 7 FOLFIRI 09/26/2019, bevacizumab remains on hold secondary to hypertension Cycle 8 FOLFIRI 10/17/2019, bevacizumab held secondary to hypertension, Udenyca added for neutropenia Cycle 9 FOLFIRI 11/07/2019, bevacizumab held secondary to hypertension, Udenyca Cycle 10 FOLFIRI 11/28/2019, bevacizumab held, Udenyca Cycle 11 FOLFIRI 12/26/2019, bevacizumab held, Udenyca CTs 01/25/2020-no evidence of recurrent disease, resolution of right lower lobe nodule Maintenance Xeloda beginning 02/06/2020 CTs 04/25/2020- no evidence of metastatic disease, multiple bilateral renal calculi without hydronephrosis Maintenance Xeloda continued CT  abdomen/pelvis without contrast 08/10/2020-bilateral staghorn renal calculi, no evidence of metastatic disease; addendum 08/23/2020-small but increasing omental nodules. Cycle 1 FOLFIRI/panitumumab 09/19/2020 Cycle 2 FOLFIRI/Panitumumab 10/03/2020   Hypertension  G4 P3, twins Kidney stones-bilateral staghorn renal calculi on CT 08/10/2020 Infected Port-A-Cath 09/12/2019-placed on Augmentin, referred for Port-A-Cath removal; Port-A-Cath removed 09/14/2019; PICC line placed 09/14/2019; culture staph aureus.  Course of Septra completed. Neutropenia secondary to chemotherapy-Udenyca added with cycle 8 FOLFIRI Right nephrostomy tube 09/12/2020        Disposition: [name] appears stable.  She has completed 1 cycle of FOLFIRI/panitumumab.  Post chemotherapy course complicated by delayed nausea and diarrhea.  She will begin prophylactic dexamethasone on day 2, 4 mg twice daily for 3 days.  She will try Lomotil for diarrhea.  Both prescriptions sent to her pharmacy.  Zofran and Compazine as needed for nausea.  She can begin Zofran 72 hours after chemotherapy if needed.  We reviewed the CBC from today.  Counts adequate to proceed with treatment.  She will follow-up with Dr. Amalia Hailey regarding the nephrostomy tube.  She will return for lab, follow-up, cycle 3 FOLFIRI/Panitumumab in 2 weeks.    Ned Card ANP/GNP-BC   10/03/2020  8:35 AM

## 2020-10-03 NOTE — Telephone Encounter (Signed)
Called per Provider L. Thomas left message stating pt stitches came out office will return call to pt also gave this office number if anymore information needed

## 2020-10-03 NOTE — Progress Notes (Signed)
Patient presents for treatment. RN assessment completed along with the following:  Treatment Conditions (labs/vitals/weight) reviewed - Yes, and within treatment parameters.   Oncology Treatment Attestation completed for current therapy- Yes, on date 09/07/20 Informed consent completed and reflects current therapy/intent - Yes, on date 08/19/20             Provider progress note reviewed - Patient not seen by provider today. Most recent note dated 09/20/20 reviewed. Treatment/Antibody/Supportive plan reviewed - Yes, and there are no adjustments needed for today's treatment. S&H and other orders reviewed - Yes, and there are no additional orders identified. Previous treatment date reviewed - Yes, and the appropriate amount of time has elapsed between treatments. Clinic Hand Off Received from - Ned Card, [place] Patient states she has the "feeling of pins and needles and itchiness" on skin of her back.  Ned Card, PA made aware.  Ok to proceed with treatment.   Patient to proceed with treatment.    Hypersensitivity Reaction note  Date of event: 10/03/20 Time of event: 1135 Generic name of drug involved: irinotecan/leucovorin Name of provider notified of the hypersensitivity reaction: Ned Card, NP Was agent that likely caused hypersensitivity reaction added to Allergies List within EMR? 10/03/20 Chain of events including reaction signs/symptoms, treatment administered, and outcome (e.g., drug resumed; drug discontinued; sent to Emergency Department; etc.)  Patient reported stomach cramping towards end of inrinotecan/leucovorin infusion.  Chemo infusion was placed on hold and NS started.  Ned Card, NP made aware.  Verbal order received to give an additional dose of atropine (see MAR). Within 10 minutes symptoms resolved.  Chemo infusion restarted and was completed without any additional issues.  VSS upon leaving infusion room.   Teodoro Spray, RN 10/03/2020 12:56 PM

## 2020-10-03 NOTE — Patient Instructions (Addendum)
PICC Home Care Guide A peripherally inserted central catheter (PICC) is a form of IV access that allows medicines and IV fluids to be quickly distributed throughout the body. The PICC is a long, thin, flexible tube (catheter) that is inserted into a vein in the upper arm. The catheter ends in a large vein in the chest (superior vena cava, or SVC). After the PICC is inserted, a chest X-ray may be done to make sure that it is in the correct place. A PICC may be placed for different reasons, such as: To give medicines and liquid nutrition. To give IV fluids and blood products. If there is trouble placing a peripheral intravenous (PIV) catheter. If taken care of properly, a PICC can remain in place for several months. Having a PICC can also allow a person to go home from the hospital sooner. Medicine and PICC care can be managed at home by a family member, caregiver, or home health care team. What are the risks? Generally, having a PICC is safe. However, problems may occur, including: A blood clot (thrombus) forming in or at the tip of the PICC. A blood clot forming in a vein (deep vein thrombosis) or traveling to the lung (pulmonary embolism). Inflammation of the vein (phlebitis) in which the PICC is placed. Infection. Central line associated blood stream infection (CLABSI) is a serious infection that often requires hospitalization. PICC movement (malposition). The PICC tip may move from its original position due to excessive physical activity, forceful coughing, sneezing, or vomiting. A break or cut in the PICC. It is important not to use scissors near the PICC. Nerve or tendon irritation or injury during PICC insertion. How to take care of your PICC Preventing problems You and any caregivers should wash your hands often with soap. Wash hands: Before touching the PICC line or the infusion device. Before changing a bandage (dressing). Flush the PICC as told by your health care provider. Let your  health care provider know right away if the PICC is hard to flush or does not flush. Do not use force to flush the PICC. Do not use a syringe that is less than 10 mL to flush the PICC. Avoid blood pressure checks on the arm in which the PICC is placed. Never pull or tug on the PICC. Do not take the PICC out yourself. Only a trained clinical professional should remove the PICC. Use clean and sterile supplies only. Keep the supplies in a dry place. Do not reuse needles, syringes, or any other supplies. Doing that can lead to infection. Keep pets and children away from your PICC line. Check the PICC insertion site every day for signs of infection. Check for: Leakage. Redness, swelling, or pain. Fluid or blood. Warmth. Pus or a bad smell. PICC dressing care Keep your PICC bandage (dressing) clean and dry to prevent infection. Do not take baths, swim, or use a hot tub until your health care provider approves. Ask your health care provider if you can take showers. You may only be allowed to take sponge baths for bathing. When you are allowed to shower: Ask your health care provider to teach you how to wrap the PICC line. Cover the PICC line with clear plastic wrap and tape to keep it dry while showering. Follow instructions from your health care provider about how to take care of your insertion site and dressing. Make sure you: Wash your hands with soap and water before you change your bandage (dressing). If soap and water are  not available, use hand sanitizer. Change your dressing as told by your health care provider. Leave stitches (sutures), skin glue, or adhesive strips in place. These skin closures may need to stay in place for 2 weeks or longer. If adhesive strip edges start to loosen and curl up, you may trim the loose edges. Do not remove adhesive strips completely unless your health care provider tells you to do that. Change your PICC dressing if it becomes loose or wet. General  instructions  Carry your PICC identification card or wear a medical alert bracelet at all times. Keep the tube clamped at all times, unless it is being used. Carry a smooth-edge clamp with you at all times to place on the tube if it breaks. Do not use scissors or sharp objects near the tube. You may bend your arm and move it freely. If your PICC is near or at the bend of your elbow, avoid activity with repeated motion at the elbow. Avoid lifting heavy objects as told by your health care provider. Keep all follow-up visits as told by your health care provider. This is important. Disposal of supplies Throw away any syringes in a disposal container that is meant for sharp items (sharps container). You can buy a sharps container from a pharmacy, or you can make one by using an empty hard plastic bottle with a cover. Place any used dressings or infusion bags into a plastic bag. Throw that bag in the trash. Contact a health care provider if: You have pain in your arm, ear, face, or teeth. You have a fever or chills. You have redness, swelling, or pain around the insertion site. You have fluid or blood coming from the insertion site. Your insertion site feels warm to the touch. You have pus or a bad smell coming from the insertion site. Your skin feels hard and raised around the insertion site. Get help right away if: Your PICC is accidentally pulled all the way out. If this happens, cover the insertion site with a bandage or gauze dressing. Do not throw the PICC away. Your health care provider will need to check it. Your PICC was tugged or pulled and has partially come out. Do not  push the PICC back in. You cannot flush the PICC, it is hard to flush, or the PICC leaks around the insertion site when it is flushed. You hear a "flushing" sound when the PICC is flushed. You feel your heart racing or skipping beats. There is a hole or tear in the PICC. You have swelling in the arm in which the PICC  was inserted. You have a red streak going up your arm from where the PICC was inserted. Summary A peripherally inserted central catheter (PICC) is a long, thin, flexible tube (catheter) that is inserted into a vein in the upper arm. The PICC is inserted using a sterile technique by a specially trained nurse or physician. Only a trained clinical professional should remove it. Keep your PICC identification card with you at all times. Avoid blood pressure checks on the arm in which the PICC is placed. If cared for properly, a PICC can remain in place for several months. Having a PICC can also allow a person to go home from the hospital sooner. This information is not intended to replace advice given to you by your health care provider. Make sure you discuss any questions you have with your health care provider. Document Revised: 05/07/2019 Document Reviewed: 05/11/2019 Elsevier Patient Education  2022  [employer].

## 2020-10-05 ENCOUNTER — Inpatient Hospital Stay: Payer: Medicaid Other

## 2020-10-05 ENCOUNTER — Other Ambulatory Visit: Payer: Self-pay

## 2020-10-05 VITALS — BP 150/90 | HR 72 | Resp 18

## 2020-10-05 DIAGNOSIS — Z5111 Encounter for antineoplastic chemotherapy: Secondary | ICD-10-CM | POA: Diagnosis not present

## 2020-10-05 DIAGNOSIS — C19 Malignant neoplasm of rectosigmoid junction: Secondary | ICD-10-CM

## 2020-10-05 MED ORDER — HEPARIN SOD (PORK) LOCK FLUSH 100 UNIT/ML IV SOLN
250.0000 [IU] | Freq: Once | INTRAVENOUS | Status: AC | PRN
  Administered 2020-10-05: 250 [IU]

## 2020-10-05 MED ORDER — PEGFILGRASTIM-CBQV 6 MG/0.6ML ~~LOC~~ SOSY
6.0000 mg | PREFILLED_SYRINGE | Freq: Once | SUBCUTANEOUS | Status: AC
  Administered 2020-10-05: 6 mg via SUBCUTANEOUS

## 2020-10-05 MED ORDER — SODIUM CHLORIDE 0.9% FLUSH
10.0000 mL | INTRAVENOUS | Status: DC | PRN
  Administered 2020-10-05: 10 mL

## 2020-10-05 NOTE — Patient Instructions (Signed)
Pegfilgrastim injection What is this medication? PEGFILGRASTIM (PEG fil gra stim) is a long-acting granulocyte colony-stimulating factor that stimulates the growth of neutrophils, a type of white blood cell important in the body's fight against infection. It is used to reduce the incidence of fever and infection in patients with certain types of cancer who are receiving chemotherapy that affects the bone marrow, and to increase survival after being exposed to high doses of radiation. This medicine may be used for other purposes; ask your health care provider or pharmacist if you have questions. COMMON BRAND NAME(S): Rexene Edison, Ziextenzo What should I tell my care team before I take this medication? They need to know if you have any of these conditions: kidney disease latex allergy ongoing radiation therapy sickle cell disease skin reactions to acrylic adhesives (On-Body Injector only) an unusual or allergic reaction to pegfilgrastim, filgrastim, other medicines, foods, dyes, or preservatives pregnant or trying to get pregnant breast-feeding How should I use this medication? This medicine is for injection under the skin. If you get this medicine at home, you will be taught how to prepare and give the pre-filled syringe or how to use the On-body Injector. Refer to the patient Instructions for Use for detailed instructions. Use exactly as directed. Tell your healthcare provider immediately if you suspect that the On-body Injector may not have performed as intended or if you suspect the use of the On-body Injector resulted in a missed or partial dose. It is important that you put your used needles and syringes in a special sharps container. Do not put them in a trash can. If you do not have a sharps container, call your pharmacist or healthcare provider to get one. Talk to your pediatrician regarding the use of this medicine in children. While this drug may be prescribed for  selected conditions, precautions do apply. Overdosage: If you think you have taken too much of this medicine contact a poison control center or emergency room at once. NOTE: This medicine is only for you. Do not share this medicine with others. What if I miss a dose? It is important not to miss your dose. Call your doctor or health care professional if you miss your dose. If you miss a dose due to an On-body Injector failure or leakage, a new dose should be administered as soon as possible using a single prefilled syringe for manual use. What may interact with this medication? Interactions have not been studied. This list may not describe all possible interactions. Give your health care provider a list of all the medicines, herbs, non-prescription drugs, or dietary supplements you use. Also tell them if you smoke, drink alcohol, or use illegal drugs. Some items may interact with your medicine. What should I watch for while using this medication? Your condition will be monitored carefully while you are receiving this medicine. You may need blood work done while you are taking this medicine. Talk to your health care provider about your risk of cancer. You may be more at risk for certain types of cancer if you take this medicine. If you are going to need a MRI, CT scan, or other procedure, tell your doctor that you are using this medicine (On-Body Injector only). What side effects may I notice from receiving this medication? Side effects that you should report to your doctor or health care professional as soon as possible: allergic reactions (skin rash, itching or hives, swelling of the face, lips, or tongue) back pain dizziness fever pain,  redness, or irritation at site where injected pinpoint red spots on the skin red or dark-brown urine shortness of breath or breathing problems stomach or side pain, or pain at the shoulder swelling tiredness trouble passing urine or change in the amount of  urine unusual bruising or bleeding Side effects that usually do not require medical attention (report to your doctor or health care professional if they continue or are bothersome): bone pain muscle pain This list may not describe all possible side effects. Call your doctor for medical advice about side effects. You may report side effects to FDA at 1-800-FDA-1088. Where should I keep my medication? Keep out of the reach of children. If you are using this medicine at home, you will be instructed on how to store it. Throw away any unused medicine after the expiration date on the label. NOTE: This sheet is a summary. It may not cover all possible information. If you have questions about this medicine, talk to your doctor, pharmacist, or health care provider.  2022 Elsevier/Gold Standard (2020-01-27 11:54:14) PICC Home Care Guide A peripherally inserted central catheter (PICC) is a form of IV access that allows medicines and IV fluids to be quickly distributed throughout the body. The PICC is a long, thin, flexible tube (catheter) that is inserted into a vein in the upper arm. The catheter ends in a large vein in the chest (superior vena cava, or SVC). After the PICC is inserted, a chest X-ray may be done to make sure that it is in the correct place. A PICC may be placed for different reasons, such as: To give medicines and liquid nutrition. To give IV fluids and blood products. If there is trouble placing a peripheral intravenous (PIV) catheter. If taken care of properly, a PICC can remain in place for several months. Having a PICC can also allow a person to go home from the hospital sooner. Medicine and PICC care can be managed at home by a family member, caregiver, or home health care team. What are the risks? Generally, having a PICC is safe. However, problems may occur, including: A blood clot (thrombus) forming in or at the tip of the PICC. A blood clot forming in a vein (deep vein  thrombosis) or traveling to the lung (pulmonary embolism). Inflammation of the vein (phlebitis) in which the PICC is placed. Infection. Central line associated blood stream infection (CLABSI) is a serious infection that often requires hospitalization. PICC movement (malposition). The PICC tip may move from its original position due to excessive physical activity, forceful coughing, sneezing, or vomiting. A break or cut in the PICC. It is important not to use scissors near the PICC. Nerve or tendon irritation or injury during PICC insertion. How to take care of your PICC Preventing problems You and any caregivers should wash your hands often with soap. Wash hands: Before touching the PICC line or the infusion device. Before changing a bandage (dressing). Flush the PICC as told by your health care provider. Let your health care provider know right away if the PICC is hard to flush or does not flush. Do not use force to flush the PICC. Do not use a syringe that is less than 10 mL to flush the PICC. Avoid blood pressure checks on the arm in which the PICC is placed. Never pull or tug on the PICC. Do not take the PICC out yourself. Only a trained clinical professional should remove the PICC. Use clean and sterile supplies only. Keep the supplies in  a dry place. Do not reuse needles, syringes, or any other supplies. Doing that can lead to infection. Keep pets and children away from your PICC line. Check the PICC insertion site every day for signs of infection. Check for: Leakage. Redness, swelling, or pain. Fluid or blood. Warmth. Pus or a bad smell. PICC dressing care Keep your PICC bandage (dressing) clean and dry to prevent infection. Do not take baths, swim, or use a hot tub until your health care provider approves. Ask your health care provider if you can take showers. You may only be allowed to take sponge baths for bathing. When you are allowed to shower: Ask your health care provider to  teach you how to wrap the PICC line. Cover the PICC line with clear plastic wrap and tape to keep it dry while showering. Follow instructions from your health care provider about how to take care of your insertion site and dressing. Make sure you: Wash your hands with soap and water before you change your bandage (dressing). If soap and water are not available, use hand sanitizer. Change your dressing as told by your health care provider. Leave stitches (sutures), skin glue, or adhesive strips in place. These skin closures may need to stay in place for 2 weeks or longer. If adhesive strip edges start to loosen and curl up, you may trim the loose edges. Do not remove adhesive strips completely unless your health care provider tells you to do that. Change your PICC dressing if it becomes loose or wet. General instructions  Carry your PICC identification card or wear a medical alert bracelet at all times. Keep the tube clamped at all times, unless it is being used. Carry a smooth-edge clamp with you at all times to place on the tube if it breaks. Do not use scissors or sharp objects near the tube. You may bend your arm and move it freely. If your PICC is near or at the bend of your elbow, avoid activity with repeated motion at the elbow. Avoid lifting heavy objects as told by your health care provider. Keep all follow-up visits as told by your health care provider. This is important. Disposal of supplies Throw away any syringes in a disposal container that is meant for sharp items (sharps container). You can buy a sharps container from a pharmacy, or you can make one by using an empty hard plastic bottle with a cover. Place any used dressings or infusion bags into a plastic bag. Throw that bag in the trash. Contact a health care provider if: You have pain in your arm, ear, face, or teeth. You have a fever or chills. You have redness, swelling, or pain around the insertion site. You have fluid or  blood coming from the insertion site. Your insertion site feels warm to the touch. You have pus or a bad smell coming from the insertion site. Your skin feels hard and raised around the insertion site. Get help right away if: Your PICC is accidentally pulled all the way out. If this happens, cover the insertion site with a bandage or gauze dressing. Do not throw the PICC away. Your health care provider will need to check it. Your PICC was tugged or pulled and has partially come out. Do not  push the PICC back in. You cannot flush the PICC, it is hard to flush, or the PICC leaks around the insertion site when it is flushed. You hear a "flushing" sound when the PICC is flushed. You feel  your heart racing or skipping beats. There is a hole or tear in the PICC. You have swelling in the arm in which the PICC was inserted. You have a red streak going up your arm from where the PICC was inserted. Summary A peripherally inserted central catheter (PICC) is a long, thin, flexible tube (catheter) that is inserted into a vein in the upper arm. The PICC is inserted using a sterile technique by a specially trained nurse or physician. Only a trained clinical professional should remove it. Keep your PICC identification card with you at all times. Avoid blood pressure checks on the arm in which the PICC is placed. If cared for properly, a PICC can remain in place for several months. Having a PICC can also allow a person to go home from the hospital sooner. This information is not intended to replace advice given to you by your health care provider. Make sure you discuss any questions you have with your health care provider. Document Revised: 05/07/2019 Document Reviewed: 05/11/2019 Elsevier Patient Education  2022 [employer].

## 2020-10-14 ENCOUNTER — Other Ambulatory Visit: Payer: Self-pay | Admitting: Oncology

## 2020-10-15 DIAGNOSIS — C189 Malignant neoplasm of colon, unspecified: Secondary | ICD-10-CM | POA: Insufficient documentation

## 2020-10-15 DIAGNOSIS — C19 Malignant neoplasm of rectosigmoid junction: Secondary | ICD-10-CM | POA: Insufficient documentation

## 2020-10-16 ENCOUNTER — Other Ambulatory Visit: Payer: Self-pay | Admitting: Family

## 2020-10-16 ENCOUNTER — Telehealth: Payer: Self-pay | Admitting: Family

## 2020-10-16 NOTE — Telephone Encounter (Signed)
Pt stated that she no longer takes the BP medication and she has another provider already. She will not be following April [name] to [facility].

## 2020-10-16 NOTE — Telephone Encounter (Signed)
Please call to see what her plans are for continued primary care management. She will need OV in November; this is last refill I can give her.

## 2020-10-17 ENCOUNTER — Encounter: Payer: Self-pay | Admitting: Nurse Practitioner

## 2020-10-17 ENCOUNTER — Other Ambulatory Visit: Payer: Self-pay

## 2020-10-17 ENCOUNTER — Inpatient Hospital Stay (HOSPITAL_BASED_OUTPATIENT_CLINIC_OR_DEPARTMENT_OTHER): Payer: Medicaid Other | Admitting: Nurse Practitioner

## 2020-10-17 ENCOUNTER — Inpatient Hospital Stay: Payer: Medicaid Other

## 2020-10-17 ENCOUNTER — Inpatient Hospital Stay: Payer: Medicaid Other | Attending: Oncology

## 2020-10-17 VITALS — BP 150/90 | HR 98 | Temp 97.8°F | Resp 18 | Ht 67.0 in | Wt 158.2 lb

## 2020-10-17 DIAGNOSIS — C187 Malignant neoplasm of sigmoid colon: Secondary | ICD-10-CM | POA: Diagnosis present

## 2020-10-17 DIAGNOSIS — C19 Malignant neoplasm of rectosigmoid junction: Secondary | ICD-10-CM

## 2020-10-17 DIAGNOSIS — Z5112 Encounter for antineoplastic immunotherapy: Secondary | ICD-10-CM | POA: Insufficient documentation

## 2020-10-17 DIAGNOSIS — Z5111 Encounter for antineoplastic chemotherapy: Secondary | ICD-10-CM | POA: Insufficient documentation

## 2020-10-17 DIAGNOSIS — Z5189 Encounter for other specified aftercare: Secondary | ICD-10-CM | POA: Insufficient documentation

## 2020-10-17 DIAGNOSIS — Z79899 Other long term (current) drug therapy: Secondary | ICD-10-CM | POA: Insufficient documentation

## 2020-10-17 LAB — CEA (ACCESS): CEA (CHCC): 2.96 ng/mL (ref 0.00–5.00)

## 2020-10-17 LAB — CBC WITH DIFFERENTIAL (CANCER CENTER ONLY)
Abs Immature Granulocytes: 0.47 10*3/uL — ABNORMAL HIGH (ref 0.00–0.07)
Basophils Absolute: 0.1 10*3/uL (ref 0.0–0.1)
Basophils Relative: 1 %
Eosinophils Absolute: 0.3 10*3/uL (ref 0.0–0.5)
Eosinophils Relative: 2 %
HCT: 34.6 % — ABNORMAL LOW (ref 36.0–46.0)
Hemoglobin: 11.4 g/dL — ABNORMAL LOW (ref 12.0–15.0)
Immature Granulocytes: 3 %
Lymphocytes Relative: 22 %
Lymphs Abs: 3.5 10*3/uL (ref 0.7–4.0)
MCH: 31.2 pg (ref 26.0–34.0)
MCHC: 32.9 g/dL (ref 30.0–36.0)
MCV: 94.8 fL (ref 80.0–100.0)
Monocytes Absolute: 0.9 10*3/uL (ref 0.1–1.0)
Monocytes Relative: 6 %
Neutro Abs: 10.8 10*3/uL — ABNORMAL HIGH (ref 1.7–7.7)
Neutrophils Relative %: 66 %
Platelet Count: 323 10*3/uL (ref 150–400)
RBC: 3.65 MIL/uL — ABNORMAL LOW (ref 3.87–5.11)
RDW: 13.8 % (ref 11.5–15.5)
WBC Count: 16 10*3/uL — ABNORMAL HIGH (ref 4.0–10.5)
nRBC: 0 % (ref 0.0–0.2)

## 2020-10-17 LAB — CMP (CANCER CENTER ONLY)
ALT: 9 U/L (ref 0–44)
AST: 12 U/L — ABNORMAL LOW (ref 15–41)
Albumin: 3.9 g/dL (ref 3.5–5.0)
Alkaline Phosphatase: 137 U/L — ABNORMAL HIGH (ref 38–126)
Anion gap: 6 (ref 5–15)
BUN: 8 mg/dL (ref 6–20)
CO2: 29 mmol/L (ref 22–32)
Calcium: 9.5 mg/dL (ref 8.9–10.3)
Chloride: 103 mmol/L (ref 98–111)
Creatinine: 0.55 mg/dL (ref 0.44–1.00)
GFR, Estimated: >60 mL/min (ref >60)
Glucose, Bld: 105 mg/dL — ABNORMAL HIGH (ref 70–99)
Potassium: 3.8 mmol/L (ref 3.5–5.1)
Sodium: 138 mmol/L (ref 135–145)
Total Bilirubin: 0.2 mg/dL — ABNORMAL LOW (ref 0.3–1.2)
Total Protein: 6.7 g/dL (ref 6.5–8.1)

## 2020-10-17 LAB — MAGNESIUM: Magnesium: 2 mg/dL (ref 1.7–2.4)

## 2020-10-17 MED ORDER — ATROPINE SULFATE 1 MG/ML IJ SOLN
0.5000 mg | Freq: Once | INTRAMUSCULAR | Status: AC
  Administered 2020-10-17: 0.5 mg via INTRAVENOUS
  Filled 2020-10-17: qty 1

## 2020-10-17 MED ORDER — SODIUM CHLORIDE 0.9 % IV SOLN
10.0000 mg | Freq: Once | INTRAVENOUS | Status: AC
  Administered 2020-10-17: 10 mg via INTRAVENOUS
  Filled 2020-10-17: qty 1

## 2020-10-17 MED ORDER — SODIUM CHLORIDE 0.9 % IV SOLN
6.0000 mg/kg | Freq: Once | INTRAVENOUS | Status: AC
  Administered 2020-10-17: 400 mg via INTRAVENOUS
  Filled 2020-10-17: qty 20

## 2020-10-17 MED ORDER — SODIUM CHLORIDE 0.9 % IV SOLN: Freq: Once | INTRAVENOUS | Status: AC

## 2020-10-17 MED ORDER — SODIUM CHLORIDE 0.9 % IV SOLN
2400.0000 mg/m2 | INTRAVENOUS | Status: DC
  Administered 2020-10-17: 4450 mg via INTRAVENOUS
  Filled 2020-10-17: qty 89

## 2020-10-17 MED ORDER — SODIUM CHLORIDE 0.9 % IV SOLN
150.0000 mg | Freq: Once | INTRAVENOUS | Status: AC
  Administered 2020-10-17: 150 mg via INTRAVENOUS
  Filled 2020-10-17: qty 5

## 2020-10-17 MED ORDER — PALONOSETRON HCL INJECTION 0.25 MG/5ML
0.2500 mg | Freq: Once | INTRAVENOUS | Status: AC
Start: 2020-10-17 — End: 2020-10-17
  Administered 2020-10-17: 0.25 mg via INTRAVENOUS
  Filled 2020-10-17: qty 5

## 2020-10-17 MED ORDER — SODIUM CHLORIDE 0.9% FLUSH: 10.0000 mL | INTRAVENOUS | Status: DC | PRN

## 2020-10-17 MED ORDER — SODIUM CHLORIDE 0.9 % IV SOLN
180.0000 mg/m2 | Freq: Once | INTRAVENOUS | Status: AC
  Administered 2020-10-17: 340 mg via INTRAVENOUS
  Filled 2020-10-17: qty 5

## 2020-10-17 MED ORDER — SODIUM CHLORIDE 0.9 % IV SOLN
400.0000 mg/m2 | Freq: Once | INTRAVENOUS | Status: AC
  Administered 2020-10-17: 744 mg via INTRAVENOUS
  Filled 2020-10-17: qty 37.2

## 2020-10-17 MED ORDER — FLUOROURACIL CHEMO INJECTION 2.5 GM/50ML
400.0000 mg/m2 | Freq: Once | INTRAVENOUS | Status: AC
  Administered 2020-10-17: 750 mg via INTRAVENOUS
  Filled 2020-10-17: qty 15

## 2020-10-17 NOTE — Patient Instructions (Addendum)

## 2020-10-17 NOTE — Progress Notes (Signed)
[facility] OFFICE PROGRESS NOTE   Diagnosis: Colon cancer  INTERVAL HISTORY:   April [name] returns as scheduled.  She completed cycle 2 FOLFIRI/Panitumumab 10/03/2020.  She reports tolerating cycle 2 much better than cycle 1.  No significant nausea/vomiting.  No mouth sores.  No diarrhea.  She has noted a mild rash on her face.  Objective:  Vital signs in last 24 hours:  Blood pressure (!) 150/90, pulse 98, temperature 97.8 F (36.6 C), temperature source Oral, resp. rate 18, height _0  (1.702 m), weight 158 lb 3.2 oz (71.8 kg), SpO2 97 %.    HEENT: No thrush or ulcers. Resp: Lungs clear bilaterally. Cardio: Regular rate and rhythm. GI: Abdomen soft and nontender.  No hepatomegaly. Vascular: No leg edema. Skin: Palms without erythema.  Mild acne type rash mainly on the nose. Right upper extremity PICC without erythema.   Lab Results:  Lab Results  Component Value Date   WBC 16.0 (H) 10/17/2020   HGB 11.4 (L) 10/17/2020   HCT 34.6 (L) 10/17/2020   MCV 94.8 10/17/2020   PLT 323 10/17/2020   NEUTROABS 10.8 (H) 10/17/2020    Imaging:  No results found.  Medications: I have reviewed the patient's current medications.  Assessment/Plan: Sigmoid colon cancer, stage IV (pT4a,pN2b,M1c) Colonoscopy 03/24/2019-3 rectal polyps-hyperplastic polyps, distal colon biopsy-at least intramucosal adenocarcinoma, completely obstructing mass in the distal sigmoid colon, could not be traversed, intact mismatch repair protein expression 03/24/2019-CEA 56.1 03/29/2019 CT abdomen/pelvis-circumferential thickening involving the entire mid and distal sigmoid colon to the level of the rectosigmoid junction, solid/cystic mass in the left ovary, bilateral nephrolithiasis Robotic assisted low anterior resection, mesenteric lymphadenectomy, bilateral salpingo-oophorectomy 04/08/2019 Pathology ([facility] review of outside pathology) metastatic adenocarcinoma involving the peritoneum  overlying the round ligament, serosa of the urinary bladder, serosa of the right ureter a sacral area, pelvic peritoneum, and left ovary.  Omental biopsy with focal mucin pools with no carcinoma cells identified, right hemidiaphragm biopsy involved by metastatic adenocarcinoma, invasive adenocarcinoma the sigmoid colon, moderately differentiated, T4a, perineural and vascular invasion present, 7/22 lymph nodes, multiple tumor deposits, resection margins negative Negative for PD-L1, low probability of MSI-high, HER-2 negative, negative for BRAF, NRAS and KRAS alterations CTs 05/19/2019-no evidence of metastatic disease, findings suspicious for colitis of the transverse and ascending colon, small amount of ascites in the cul-de-sac, bilateral renal calculi Cycle 1 FOLFIRI 05/24/2019, bevacizumab added with cycle 2 Cycle 4 FOLFIRI/bevacizumab 07/05/2019 Cycle 5 FOLFIRI 07/27/2019, bevacizumab held secondary to hypertension Cycle 6 FOLFIRI 08/10/1999, bevacizumab held secondary to hypertension CTs 08/30/2019-no evidence of recurrent disease, new subsolid right lower lobe nodule felt to be inflammatory, emphysema Cycle 7 FOLFIRI 09/26/2019, bevacizumab remains on hold secondary to hypertension Cycle 8 FOLFIRI 10/17/2019, bevacizumab held secondary to hypertension, Udenyca added for neutropenia Cycle 9 FOLFIRI 11/07/2019, bevacizumab held secondary to hypertension, Udenyca Cycle 10 FOLFIRI 11/28/2019, bevacizumab held, Udenyca Cycle 11 FOLFIRI 12/26/2019, bevacizumab held, Udenyca CTs 01/25/2020-no evidence of recurrent disease, resolution of right lower lobe nodule Maintenance Xeloda beginning 02/06/2020 CTs 04/25/2020- no evidence of metastatic disease, multiple bilateral renal calculi without hydronephrosis Maintenance Xeloda continued CT abdomen/pelvis without contrast 08/10/2020-bilateral staghorn renal calculi, no evidence of metastatic disease; addendum 08/23/2020-small but increasing omental nodules. Cycle 1  FOLFIRI/panitumumab 09/19/2020 Cycle 2 FOLFIRI/Panitumumab 10/03/2020 Cycle 3 FOLFIRI/Panitumumab 10/17/2020   Hypertension G4 P3, twins Kidney stones-bilateral staghorn renal calculi on CT 08/10/2020 Infected Port-A-Cath 09/12/2019-placed on Augmentin, referred for Port-A-Cath removal; Port-A-Cath removed 09/14/2019; PICC line placed 09/14/2019; culture staph aureus.  Course  of Septra completed. Neutropenia secondary to chemotherapy-Udenyca added with cycle 8 FOLFIRI Right nephrostomy tube 09/12/2020; stent placed 10/09/2020        Disposition: April [name] appears stable.  She has completed 2 cycles of FOLFIRI/Panitumumab.  She noted improved tolerance with cycle 2.  Plan to proceed with cycle 3 today as scheduled.  CBC and chemistry panel reviewed.  Labs adequate to proceed as above.  She will return for lab, follow-up, cycle 4 FOLFIRI/Panitumumab in 2 weeks.  She will contact the office in the interim with any problems.    Ned Card ANP/GNP-BC   10/17/2020  11:28 AM

## 2020-10-17 NOTE — Progress Notes (Signed)
Patient presents for treatment. RN assessment completed along with the following:  Labs/vitals reviewed - Yes, and within treatment parameters.   Weight within 10% of previous measurement - Yes Oncology Treatment Attestation completed for current therapy- Yes, on date 09/07/20 Informed consent completed and reflects current therapy/intent - Yes, on date 08/19/20             Provider progress note reviewed - Today's provider note is not yet available. I reviewed the most recent oncology provider progress note in chart dated 10/03/20. Treatment/Antibody/Supportive plan reviewed - Yes, and there are no adjustments needed for today's treatment. S&H and other orders reviewed - Yes, and there are no additional orders identified. Previous treatment date reviewed - Yes, and the appropriate amount of time has elapsed between treatments. Clinic Hand Off Received from - Ned Card, NP  Patient to proceed with treatment.

## 2020-10-19 ENCOUNTER — Inpatient Hospital Stay: Payer: Medicaid Other

## 2020-10-19 ENCOUNTER — Other Ambulatory Visit: Payer: Self-pay

## 2020-10-19 VITALS — BP 128/100 | HR 81 | Temp 98.6°F | Resp 18

## 2020-10-19 DIAGNOSIS — Z5111 Encounter for antineoplastic chemotherapy: Secondary | ICD-10-CM | POA: Diagnosis not present

## 2020-10-19 DIAGNOSIS — C19 Malignant neoplasm of rectosigmoid junction: Secondary | ICD-10-CM

## 2020-10-19 MED ORDER — SODIUM CHLORIDE 0.9% FLUSH
10.0000 mL | INTRAVENOUS | Status: DC | PRN
  Administered 2020-10-19: 10 mL

## 2020-10-19 MED ORDER — HEPARIN SOD (PORK) LOCK FLUSH 100 UNIT/ML IV SOLN
250.0000 [IU] | Freq: Once | INTRAVENOUS | Status: AC | PRN
  Administered 2020-10-19: 250 [IU]

## 2020-10-19 MED ORDER — PEGFILGRASTIM-CBQV 6 MG/0.6ML ~~LOC~~ SOSY
6.0000 mg | PREFILLED_SYRINGE | Freq: Once | SUBCUTANEOUS | Status: AC
  Administered 2020-10-19: 6 mg via SUBCUTANEOUS

## 2020-10-19 NOTE — Patient Instructions (Addendum)
PICC Home Care Guide A peripherally inserted central catheter (PICC) is a form of IV access that allows medicines and IV fluids to be quickly distributed throughout the body. The PICC is a long, thin, flexible tube (catheter) that is inserted into a vein in the upper arm. The catheter ends in a large vein in the chest (superior vena cava, or SVC). After the PICC is inserted, a chest X-ray may be done to make sure that it is in the correct place. A PICC may be placed for different reasons, such as: To give medicines and liquid nutrition. To give IV fluids and blood products. If there is trouble placing a peripheral intravenous (PIV) catheter. If taken care of properly, a PICC can remain in place for several months. Having a PICC can also allow a person to go home from the hospital sooner. Medicine and PICC care can be managed at home by a family member, caregiver, or home health care team. What are the risks? Generally, having a PICC is safe. However, problems may occur, including: A blood clot (thrombus) forming in or at the tip of the PICC. A blood clot forming in a vein (deep vein thrombosis) or traveling to the lung (pulmonary embolism). Inflammation of the vein (phlebitis) in which the PICC is placed. Infection. Central line associated blood stream infection (CLABSI) is a serious infection that often requires hospitalization. PICC movement (malposition). The PICC tip may move from its original position due to excessive physical activity, forceful coughing, sneezing, or vomiting. A break or cut in the PICC. It is important not to use scissors near the PICC. Nerve or tendon irritation or injury during PICC insertion. How to take care of your PICC Preventing problems You and any caregivers should wash your hands often with soap. Wash hands: Before touching the PICC line or the infusion device. Before changing a bandage (dressing). Flush the PICC as told by your health care provider. Let your  health care provider know right away if the PICC is hard to flush or does not flush. Do not use force to flush the PICC. Do not use a syringe that is less than 10 mL to flush the PICC. Avoid blood pressure checks on the arm in which the PICC is placed. Never pull or tug on the PICC. Do not take the PICC out yourself. Only a trained clinical professional should remove the PICC. Use clean and sterile supplies only. Keep the supplies in a dry place. Do not reuse needles, syringes, or any other supplies. Doing that can lead to infection. Keep pets and children away from your PICC line. Check the PICC insertion site every day for signs of infection. Check for: Leakage. Redness, swelling, or pain. Fluid or blood. Warmth. Pus or a bad smell. PICC dressing care Keep your PICC bandage (dressing) clean and dry to prevent infection. Do not take baths, swim, or use a hot tub until your health care provider approves. Ask your health care provider if you can take showers. You may only be allowed to take sponge baths for bathing. When you are allowed to shower: Ask your health care provider to teach you how to wrap the PICC line. Cover the PICC line with clear plastic wrap and tape to keep it dry while showering. Follow instructions from your health care provider about how to take care of your insertion site and dressing. Make sure you: Wash your hands with soap and water before you change your bandage (dressing). If soap and water are  not available, use hand sanitizer. Change your dressing as told by your health care provider. Leave stitches (sutures), skin glue, or adhesive strips in place. These skin closures may need to stay in place for 2 weeks or longer. If adhesive strip edges start to loosen and curl up, you may trim the loose edges. Do not remove adhesive strips completely unless your health care provider tells you to do that. Change your PICC dressing if it becomes loose or wet. General  instructions  Carry your PICC identification card or wear a medical alert bracelet at all times. Keep the tube clamped at all times, unless it is being used. Carry a smooth-edge clamp with you at all times to place on the tube if it breaks. Do not use scissors or sharp objects near the tube. You may bend your arm and move it freely. If your PICC is near or at the bend of your elbow, avoid activity with repeated motion at the elbow. Avoid lifting heavy objects as told by your health care provider. Keep all follow-up visits as told by your health care provider. This is important. Disposal of supplies Throw away any syringes in a disposal container that is meant for sharp items (sharps container). You can buy a sharps container from a pharmacy, or you can make one by using an empty hard plastic bottle with a cover. Place any used dressings or infusion bags into a plastic bag. Throw that bag in the trash. Contact a health care provider if: You have pain in your arm, ear, face, or teeth. You have a fever or chills. You have redness, swelling, or pain around the insertion site. You have fluid or blood coming from the insertion site. Your insertion site feels warm to the touch. You have pus or a bad smell coming from the insertion site. Your skin feels hard and raised around the insertion site. Get help right away if: Your PICC is accidentally pulled all the way out. If this happens, cover the insertion site with a bandage or gauze dressing. Do not throw the PICC away. Your health care provider will need to check it. Your PICC was tugged or pulled and has partially come out. Do not  push the PICC back in. You cannot flush the PICC, it is hard to flush, or the PICC leaks around the insertion site when it is flushed. You hear a "flushing" sound when the PICC is flushed. You feel your heart racing or skipping beats. There is a hole or tear in the PICC. You have swelling in the arm in which the PICC  was inserted. You have a red streak going up your arm from where the PICC was inserted. Summary A peripherally inserted central catheter (PICC) is a long, thin, flexible tube (catheter) that is inserted into a vein in the upper arm. The PICC is inserted using a sterile technique by a specially trained nurse or physician. Only a trained clinical professional should remove it. Keep your PICC identification card with you at all times. Avoid blood pressure checks on the arm in which the PICC is placed. If cared for properly, a PICC can remain in place for several months. Having a PICC can also allow a person to go home from the hospital sooner. This information is not intended to replace advice given to you by your health care provider. Make sure you discuss any questions you have with your health care provider. Document Revised: 05/07/2019 Document Reviewed: 05/11/2019 Elsevier Patient Education  2022  [place]. Pegfilgrastim injection What is this medication? PEGFILGRASTIM (PEG fil gra stim) is a long-acting granulocyte colony-stimulating factor that stimulates the growth of neutrophils, a type of white blood cell important in the body's fight against infection. It is used to reduce the incidence of fever and infection in patients with certain types of cancer who are receiving chemotherapy that affects the bone marrow, and to increase survival after being exposed to high doses of radiation. This medicine may be used for other purposes; ask your health care provider or pharmacist if you have questions. COMMON BRAND NAME(S): Rexene Edison, Ziextenzo What should I tell my care team before I take this medication? They need to know if you have any of these conditions: kidney disease latex allergy ongoing radiation therapy sickle cell disease skin reactions to acrylic adhesives (On-Body Injector only) an unusual or allergic reaction to pegfilgrastim, filgrastim, other  medicines, foods, dyes, or preservatives pregnant or trying to get pregnant breast-feeding How should I use this medication? This medicine is for injection under the skin. If you get this medicine at home, you will be taught how to prepare and give the pre-filled syringe or how to use the On-body Injector. Refer to the patient Instructions for Use for detailed instructions. Use exactly as directed. Tell your healthcare provider immediately if you suspect that the On-body Injector may not have performed as intended or if you suspect the use of the On-body Injector resulted in a missed or partial dose. It is important that you put your used needles and syringes in a special sharps container. Do not put them in a trash can. If you do not have a sharps container, call your pharmacist or healthcare provider to get one. Talk to your pediatrician regarding the use of this medicine in children. While this drug may be prescribed for selected conditions, precautions do apply. Overdosage: If you think you have taken too much of this medicine contact a poison control center or emergency room at once. NOTE: This medicine is only for you. Do not share this medicine with others. What if I miss a dose? It is important not to miss your dose. Call your doctor or health care professional if you miss your dose. If you miss a dose due to an On-body Injector failure or leakage, a new dose should be administered as soon as possible using a single prefilled syringe for manual use. What may interact with this medication? Interactions have not been studied. This list may not describe all possible interactions. Give your health care provider a list of all the medicines, herbs, non-prescription drugs, or dietary supplements you use. Also tell them if you smoke, drink alcohol, or use illegal drugs. Some items may interact with your medicine. What should I watch for while using this medication? Your condition will be monitored  carefully while you are receiving this medicine. You may need blood work done while you are taking this medicine. Talk to your health care provider about your risk of cancer. You may be more at risk for certain types of cancer if you take this medicine. If you are going to need a MRI, CT scan, or other procedure, tell your doctor that you are using this medicine (On-Body Injector only). What side effects may I notice from receiving this medication? Side effects that you should report to your doctor or health care professional as soon as possible: allergic reactions (skin rash, itching or hives, swelling of the face, lips, or tongue) back pain  dizziness fever pain, redness, or irritation at site where injected pinpoint red spots on the skin red or dark-brown urine shortness of breath or breathing problems stomach or side pain, or pain at the shoulder swelling tiredness trouble passing urine or change in the amount of urine unusual bruising or bleeding Side effects that usually do not require medical attention (report to your doctor or health care professional if they continue or are bothersome): bone pain muscle pain This list may not describe all possible side effects. Call your doctor for medical advice about side effects. You may report side effects to FDA at 1-800-FDA-1088. Where should I keep my medication? Keep out of the reach of children. If you are using this medicine at home, you will be instructed on how to store it. Throw away any unused medicine after the expiration date on the label. NOTE: This sheet is a summary. It may not cover all possible information. If you have questions about this medicine, talk to your doctor, pharmacist, or health care provider.  2022 Elsevier/[employer] Standard (2020-01-27 11:54:14)

## 2020-10-22 ENCOUNTER — Emergency Department (HOSPITAL_COMMUNITY): Payer: Medicaid Other

## 2020-10-22 ENCOUNTER — Inpatient Hospital Stay (HOSPITAL_COMMUNITY): Payer: Medicaid Other | Admitting: Certified Registered"

## 2020-10-22 ENCOUNTER — Inpatient Hospital Stay (HOSPITAL_COMMUNITY)
Admission: EM | Admit: 2020-10-22 | Discharge: 2020-10-25 | DRG: 659 | Disposition: A | Discharge status: Home / Self Care | Payer: Medicaid Other | Attending: Internal Medicine | Admitting: Internal Medicine

## 2020-10-22 ENCOUNTER — Encounter (HOSPITAL_COMMUNITY)
Admission: EM | Disposition: A | Discharge status: Home / Self Care | Payer: Self-pay | Source: Home / Self Care | Attending: Internal Medicine

## 2020-10-22 ENCOUNTER — Other Ambulatory Visit: Payer: Self-pay

## 2020-10-22 ENCOUNTER — Encounter (HOSPITAL_COMMUNITY): Payer: Self-pay

## 2020-10-22 DIAGNOSIS — F17210 Nicotine dependence, cigarettes, uncomplicated: Secondary | ICD-10-CM | POA: Diagnosis present

## 2020-10-22 DIAGNOSIS — N136 Pyonephrosis: Secondary | ICD-10-CM | POA: Diagnosis present

## 2020-10-22 DIAGNOSIS — Z888 Allergy status to other drugs, medicaments and biological substances status: Secondary | ICD-10-CM | POA: Diagnosis not present

## 2020-10-22 DIAGNOSIS — T83122A Displacement of urinary stent, initial encounter: Secondary | ICD-10-CM | POA: Diagnosis present

## 2020-10-22 DIAGNOSIS — Y732 Prosthetic and other implants, materials and accessory gastroenterology and urology devices associated with adverse incidents: Secondary | ICD-10-CM | POA: Diagnosis present

## 2020-10-22 DIAGNOSIS — Z87442 Personal history of urinary calculi: Secondary | ICD-10-CM

## 2020-10-22 DIAGNOSIS — Z8249 Family history of ischemic heart disease and other diseases of the circulatory system: Secondary | ICD-10-CM

## 2020-10-22 DIAGNOSIS — R310 Gross hematuria: Secondary | ICD-10-CM | POA: Diagnosis present

## 2020-10-22 DIAGNOSIS — R319 Hematuria, unspecified: Secondary | ICD-10-CM

## 2020-10-22 DIAGNOSIS — N1330 Unspecified hydronephrosis: Secondary | ICD-10-CM | POA: Diagnosis present

## 2020-10-22 DIAGNOSIS — D72829 Elevated white blood cell count, unspecified: Secondary | ICD-10-CM

## 2020-10-22 DIAGNOSIS — Z85038 Personal history of other malignant neoplasm of large intestine: Secondary | ICD-10-CM

## 2020-10-22 DIAGNOSIS — N200 Calculus of kidney: Secondary | ICD-10-CM | POA: Diagnosis not present

## 2020-10-22 DIAGNOSIS — C19 Malignant neoplasm of rectosigmoid junction: Secondary | ICD-10-CM | POA: Diagnosis present

## 2020-10-22 DIAGNOSIS — A419 Sepsis, unspecified organism: Secondary | ICD-10-CM | POA: Diagnosis present

## 2020-10-22 DIAGNOSIS — D709 Neutropenia, unspecified: Secondary | ICD-10-CM | POA: Diagnosis present

## 2020-10-22 DIAGNOSIS — E876 Hypokalemia: Secondary | ICD-10-CM | POA: Diagnosis present

## 2020-10-22 DIAGNOSIS — R6510 Systemic inflammatory response syndrome (SIRS) of non-infectious origin without acute organ dysfunction: Secondary | ICD-10-CM | POA: Diagnosis not present

## 2020-10-22 DIAGNOSIS — R109 Unspecified abdominal pain: Secondary | ICD-10-CM

## 2020-10-22 DIAGNOSIS — E872 Acidosis, unspecified: Secondary | ICD-10-CM | POA: Diagnosis present

## 2020-10-22 DIAGNOSIS — Z9221 Personal history of antineoplastic chemotherapy: Secondary | ICD-10-CM

## 2020-10-22 DIAGNOSIS — Z79899 Other long term (current) drug therapy: Secondary | ICD-10-CM

## 2020-10-22 DIAGNOSIS — R9431 Abnormal electrocardiogram [ECG] [EKG]: Secondary | ICD-10-CM | POA: Diagnosis not present

## 2020-10-22 DIAGNOSIS — Z20822 Contact with and (suspected) exposure to covid-19: Secondary | ICD-10-CM | POA: Diagnosis present

## 2020-10-22 DIAGNOSIS — I10 Essential (primary) hypertension: Secondary | ICD-10-CM | POA: Diagnosis present

## 2020-10-22 DIAGNOSIS — C20 Malignant neoplasm of rectum: Secondary | ICD-10-CM | POA: Diagnosis present

## 2020-10-22 HISTORY — PX: CYSTOSCOPY WITH RETROGRADE PYELOGRAM, URETEROSCOPY AND STENT PLACEMENT: SHX5789

## 2020-10-22 LAB — PROTIME-INR
INR: 1 (ref 0.8–1.2)
Prothrombin Time: 13.6 seconds (ref 11.4–15.2)

## 2020-10-22 LAB — URINALYSIS, ROUTINE W REFLEX MICROSCOPIC

## 2020-10-22 LAB — COMPREHENSIVE METABOLIC PANEL
ALT: 17 U/L (ref 0–44)
AST: 20 U/L (ref 15–41)
Albumin: 4.7 g/dL (ref 3.5–5.0)
Alkaline Phosphatase: 257 U/L — ABNORMAL HIGH (ref 38–126)
Anion gap: 12 (ref 5–15)
BUN: 20 mg/dL (ref 6–20)
CO2: 25 mmol/L (ref 22–32)
Calcium: 9.6 mg/dL (ref 8.9–10.3)
Chloride: 97 mmol/L — ABNORMAL LOW (ref 98–111)
Creatinine, Ser: 0.92 mg/dL (ref 0.44–1.00)
GFR, Estimated: >60 mL/min (ref >60)
Glucose, Bld: 186 mg/dL — ABNORMAL HIGH (ref 70–99)
Potassium: 3.2 mmol/L — ABNORMAL LOW (ref 3.5–5.1)
Sodium: 134 mmol/L — ABNORMAL LOW (ref 135–145)
Total Bilirubin: 0.7 mg/dL (ref 0.3–1.2)
Total Protein: 8.3 g/dL — ABNORMAL HIGH (ref 6.5–8.1)

## 2020-10-22 LAB — CBC WITH DIFFERENTIAL/PLATELET
Abs Immature Granulocytes: 0 10*3/uL (ref 0.00–0.07)
Basophils Absolute: 0.8 10*3/uL — ABNORMAL HIGH (ref 0.0–0.1)
Basophils Relative: 1 %
Eosinophils Absolute: 0 10*3/uL (ref 0.0–0.5)
Eosinophils Relative: 0 %
HCT: 43.5 % (ref 36.0–46.0)
Hemoglobin: 14.6 g/dL (ref 12.0–15.0)
Lymphocytes Relative: 12 %
Lymphs Abs: 9.1 10*3/uL — ABNORMAL HIGH (ref 0.7–4.0)
MCH: 32 pg (ref 26.0–34.0)
MCHC: 33.6 g/dL (ref 30.0–36.0)
MCV: 95.4 fL (ref 80.0–100.0)
Monocytes Absolute: 0 10*3/uL — ABNORMAL LOW (ref 0.1–1.0)
Monocytes Relative: 0 %
Neutro Abs: 65.7 10*3/uL — ABNORMAL HIGH (ref 1.7–7.7)
Neutrophils Relative %: 87 %
Platelets: 360 10*3/uL (ref 150–400)
RBC: 4.56 MIL/uL (ref 3.87–5.11)
RDW: 13.4 % (ref 11.5–15.5)
WBC: 75.5 10*3/uL (ref 4.0–10.5)
nRBC: 0 % (ref 0.0–0.2)

## 2020-10-22 LAB — RESP PANEL BY RT-PCR (FLU A&B, COVID) ARPGX2
Influenza A by PCR: NEGATIVE
Influenza B by PCR: NEGATIVE
SARS Coronavirus 2 by RT PCR: NEGATIVE

## 2020-10-22 LAB — LACTIC ACID, PLASMA: Lactic Acid, Venous: 3.2 mmol/L (ref 0.5–1.9)

## 2020-10-22 LAB — I-STAT BETA HCG BLOOD, ED (MC, WL, AP ONLY): I-stat hCG, quantitative: 7.5 m[IU]/mL — ABNORMAL HIGH (ref <5)

## 2020-10-22 LAB — HCG, QUANTITATIVE, PREGNANCY: hCG, Beta Chain, Quant, S: 2 m[IU]/mL (ref <5)

## 2020-10-22 LAB — URINALYSIS, MICROSCOPIC (REFLEX)
RBC / HPF: >50 RBC/hpf (ref 0–5)
Squamous Epithelial / HPF: NONE SEEN (ref 0–5)

## 2020-10-22 LAB — APTT: aPTT: 28 seconds (ref 24–36)

## 2020-10-22 LAB — MAGNESIUM: Magnesium: 2.1 mg/dL (ref 1.7–2.4)

## 2020-10-22 SURGERY — CYSTOURETEROSCOPY, WITH RETROGRADE PYELOGRAM AND STENT INSERTION
Anesthesia: General | Site: Ureter | Laterality: Right

## 2020-10-22 MED ORDER — MORPHINE SULFATE (PF) 4 MG/ML IV SOLN
4.0000 mg | Freq: Once | INTRAVENOUS | Status: AC
Start: 2020-10-22 — End: 2020-10-23
  Administered 2020-10-23: 2 mg via INTRAVENOUS

## 2020-10-22 MED ORDER — LACTATED RINGERS IV SOLN: INTRAVENOUS | Status: DC

## 2020-10-22 MED ORDER — SODIUM CHLORIDE 0.9 % IV SOLN
2.0000 g | Freq: Once | INTRAVENOUS | Status: AC
  Administered 2020-10-22: 2 g via INTRAVENOUS
  Filled 2020-10-22: qty 2

## 2020-10-22 MED ORDER — FENTANYL CITRATE (PF) 100 MCG/2ML IJ SOLN
INTRAMUSCULAR | Status: AC
  Filled 2020-10-22: qty 2

## 2020-10-22 MED ORDER — PROPOFOL 10 MG/ML IV BOLUS
INTRAVENOUS | Status: AC
  Filled 2020-10-22: qty 20

## 2020-10-22 MED ORDER — ROCURONIUM BROMIDE 10 MG/ML (PF) SYRINGE
PREFILLED_SYRINGE | INTRAVENOUS | Status: AC
  Filled 2020-10-22: qty 10

## 2020-10-22 MED ORDER — ONDANSETRON 4 MG PO TBDP: 4.0000 mg | ORAL_TABLET | Freq: Once | ORAL | Status: DC

## 2020-10-22 MED ORDER — MORPHINE SULFATE (PF) 4 MG/ML IV SOLN
4.0000 mg | Freq: Once | INTRAVENOUS | Status: AC
  Administered 2020-10-22: 4 mg via INTRAVENOUS
  Filled 2020-10-22: qty 1

## 2020-10-22 MED ORDER — METRONIDAZOLE 500 MG/100ML IV SOLN
500.0000 mg | Freq: Once | INTRAVENOUS | Status: AC
  Administered 2020-10-22: 500 mg via INTRAVENOUS
  Filled 2020-10-22: qty 100

## 2020-10-22 MED ORDER — VANCOMYCIN HCL 1500 MG/300ML IV SOLN
1500.0000 mg | Freq: Once | INTRAVENOUS | Status: AC
  Administered 2020-10-22: 1500 mg via INTRAVENOUS
  Filled 2020-10-22: qty 300

## 2020-10-22 MED ORDER — LACTATED RINGERS IV BOLUS
1500.0000 mL | Freq: Once | INTRAVENOUS | Status: AC
  Administered 2020-10-22: 1500 mL via INTRAVENOUS

## 2020-10-22 MED ORDER — ONDANSETRON HCL 4 MG/2ML IJ SOLN
4.0000 mg | Freq: Once | INTRAMUSCULAR | Status: AC
  Administered 2020-10-22: 4 mg via INTRAVENOUS
  Filled 2020-10-22: qty 2

## 2020-10-22 MED ORDER — ACETAMINOPHEN 10 MG/ML IV SOLN
INTRAVENOUS | Status: AC
  Filled 2020-10-22: qty 100

## 2020-10-22 MED ORDER — DEXAMETHASONE SODIUM PHOSPHATE 10 MG/ML IJ SOLN
INTRAMUSCULAR | Status: AC
  Filled 2020-10-22: qty 1

## 2020-10-22 MED ORDER — ONDANSETRON HCL 4 MG/2ML IJ SOLN
INTRAMUSCULAR | Status: AC
  Filled 2020-10-22: qty 2

## 2020-10-22 MED ORDER — VANCOMYCIN HCL IN DEXTROSE 1-5 GM/200ML-% IV SOLN: 1000.0000 mg | Freq: Once | INTRAVENOUS | Status: DC

## 2020-10-22 MED ORDER — MIDAZOLAM HCL 2 MG/2ML IJ SOLN
INTRAMUSCULAR | Status: AC
  Filled 2020-10-22: qty 2

## 2020-10-22 MED ORDER — SUCCINYLCHOLINE CHLORIDE 200 MG/10ML IV SOSY
PREFILLED_SYRINGE | INTRAVENOUS | Status: AC
  Filled 2020-10-22: qty 10

## 2020-10-22 MED ORDER — LACTATED RINGERS IV BOLUS
1000.0000 mL | Freq: Once | INTRAVENOUS | Status: AC
  Administered 2020-10-22: 1000 mL via INTRAVENOUS

## 2020-10-22 SURGICAL SUPPLY — 17 items
BAG URO CATCHER STRL LF (MISCELLANEOUS) ×2 IMPLANT
CATH FOLEY 3WAY 30CC 22FR (CATHETERS) ×2 IMPLANT
CATH URET 5FR 28IN OPEN ENDED (CATHETERS) ×2 IMPLANT
CLOTH BEACON ORANGE TIMEOUT ST (SAFETY) ×2 IMPLANT
GLOVE SURG ENC MOIS LTX SZ6.5 (GLOVE) ×2 IMPLANT
GLOVE SURG ENC MOIS LTX SZ7 (GLOVE) ×2 IMPLANT
GLOVE SURG LTX SZ7 (GLOVE) ×2 IMPLANT
GOWN STRL REUS W/TWL LRG LVL3 (GOWN DISPOSABLE) ×4 IMPLANT
GUIDEWIRE STR DUAL SENSOR (WIRE) ×2 IMPLANT
KIT TURNOVER KIT A (KITS) ×2 IMPLANT
MANIFOLD NEPTUNE II (INSTRUMENTS) ×2 IMPLANT
PACK CYSTO (CUSTOM PROCEDURE TRAY) ×2 IMPLANT
PLUG CATH AND CAP STER (CATHETERS) ×2 IMPLANT
STENT URET 6FRX26 CONTOUR (STENTS) ×2 IMPLANT
SYR 30ML LL (SYRINGE) ×2 IMPLANT
TUBING CONNECTING 10 (TUBING) ×2 IMPLANT
TUBING UROLOGY SET (TUBING) ×2 IMPLANT

## 2020-10-22 NOTE — ED Triage Notes (Signed)
Pt BIB EMS from home. Pt reports sudden onset pain to right lower flank. Pt c/o dizziness and hot flashes. Pt states she had surgery for kidney stones 9/30. EMS reports incision looks clean and intact. 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 Pt c/o hematuria. Pt has colon cancer, last chemo tx Friday 10/7. 130 HR CBG 135 BP 150/100 Capnography 30-40

## 2020-10-22 NOTE — ED Provider Notes (Signed)
[facility] DEPT Provider Note   CSN: [id number] Arrival date & time: 10/22/20  1913     History Chief Complaint  Patient presents with   Weakness   Flank Pain   Hematuria    April [name] is a 45 y.o. female with past medical history significant for colon cancer currently being treated for chemotherapy who reports her last chemo treatment was on the seventh, with history also significant for bilateral staghorn renal calculi who recently had percutaneous nephrostomy tube placed on the right side, and a pigtail catheter left in the ureter who presents with 3 days of worsening right flank pain, nausea, vomiting, stomach upset without fever.  Percutaneous nephrostomy tube was removed prior to discharge, but pigtail catheter was left in place in the right ureter.  Patient reports that she noticed bright red blood without clots in her urine this morning.  Patient reports that she took 2 hydrocodone earlier this morning with minimal relief of pain.  Patient is followed by her Amalia Hailey from urology, as well as Dr. Malachy Mood for her chemotherapy.  Patient is receiving Folfiri/Bevacizumab.  Pain is 10 out of 10 at this time, patient is doubled over in pain during my examination.   Weakness Associated symptoms: abdominal pain, nausea and vomiting   Flank Pain Associated symptoms include abdominal pain.  Hematuria Associated symptoms include abdominal pain.      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   Port-A-Cath in place 11/22/2019   Goals of care, counseling/discussion 07/21/2019   Malignant neoplasm of rectosigmoid (colon) ([place]) 04/08/2019   Sepsis secondary to UTI ([place]) 01/07/2018   Hypertension 01/07/2018   Sinus tachycardia 01/07/2018   Hypokalemia 01/07/2018    Abnormal EKG 01/07/2018   Hyperglycemia 01/07/2018   Tobacco use 01/07/2018    Past Surgical History:  Procedure Laterality Date   CESAREAN SECTION     x2   IR RADIOLOGIST EVAL & MGMT  09/16/2019   IR RADIOLOGIST EVAL & MGMT  09/23/2019   IR RADIOLOGIST EVAL & MGMT  09/20/2019   IR RADIOLOGIST EVAL & MGMT  09/30/2019   IR REMOVAL TUN ACCESS W/ PORT W/O FL MOD SED  09/14/2019   URETERAL STENT PLACEMENT       OB History   No obstetric history on file.     Family History  Problem Relation Age of Onset   Hypertension Mother    Diabetes Mellitus II Father    Stroke Father    Ulcers Sister     Social History   Tobacco Use   Smoking status: Every Day    Packs/day: 0.50    Types: Cigarettes   Smokeless tobacco: Never   Tobacco comments:    In the process of quitting/ has been able to cut back  Vaping Use   Vaping Use: Never used  Substance Use Topics   Alcohol use: Yes   Drug use: Not Currently    Home Medications Prior to Admission medications   Medication Sig Start Date End Date Taking? Authorizing Provider  acetaminophen (TYLENOL) 325 MG tablet Take 650 mg by mouth every 6 (six) hours as needed for moderate pain or headache.    [provider]  amLODipine (NORVASC) 10 MG tablet Take 1 tablet (10 mg total) by mouth  daily. 09/09/19   Marrian Salvage, FNP  dexamethasone (DECADRON) 4 MG tablet Take 1 tablet (4 mg total) by mouth 2 (two) times daily with a meal. Start on day 2 of treatment and take for 3 days with each cycle. 10/03/20   Owens Shark, NP  diphenoxylate-atropine (LOMOTIL) 2.5-0.025 MG tablet Take 1 tablet by mouth 4 (four) times daily as needed for diarrhea or loose stools. 10/03/20   Owens Shark, NP  doxycycline (VIBRA-TABS) 100 MG tablet Take 1 tablet (100 mg total) by mouth 2 (two) times daily. Begin day before first treatment 09/06/20   Owens Shark, NP  hydrALAZINE (APRESOLINE) 10 MG tablet TAKE 1 TABLET(10 MG) BY MOUTH THREE TIMES DAILY  10/25/19   Ladell Pier, MD  hydrochlorothiazide (HYDRODIURIL) 25 MG tablet TAKE 1 TABLET BY MOUTH EVERY DAY 02/07/20   Marrian Salvage, FNP  HYDROmorphone (DILAUDID) 2 MG tablet Take 1 tablet (2 mg total) by mouth every 12 (twelve) hours as needed for severe pain. 08/23/20   Owens Shark, NP  ibuprofen (ADVIL) 800 MG tablet Take 1 tablet (800 mg total) by mouth daily as needed for moderate pain. 10/02/20   Ladell Pier, MD  metoprolol tartrate (LOPRESSOR) 50 MG tablet TAKE 1 TABLET(50 MG) BY MOUTH TWICE DAILY 10/16/20   Marrian Salvage, FNP  ondansetron (ZOFRAN) 4 MG tablet Take 1 tablet (4 mg total) by mouth every 8 (eight) hours as needed for nausea or vomiting. Do not take for 3 days after chemo 10/03/20   Owens Shark, NP  oxybutynin (DITROPAN-XL) 10 MG 24 hr tablet Take 10 mg by mouth daily. 06/26/20   [provider]  potassium chloride (KLOR-CON) 10 MEQ tablet Take 1 tablet (10 mEq total) by mouth daily. 03/01/20   Ladell Pier, MD  prochlorperazine (COMPAZINE) 10 MG tablet Take 1 tablet (10 mg total) by mouth every 6 (six) hours as needed for nausea or vomiting. 09/21/20   Owens Shark, NP  sorbitol 70 % solution Take 15 mLs by mouth 2 (two) times daily. Patient not taking: No sig reported 08/23/20   Ladell Pier, MD  tiZANidine (ZANAFLEX) 4 MG tablet TAKE 1/2 TO 1 TABLET(2 TO 4 MG) BY MOUTH EVERY 8 HOURS AS NEEDED FOR MUSCLE SPASMS 08/21/20   Gregor Hams, MD    Allergies    Lisinopril, Irinotecan, and Leucovorin calcium  Review of Systems   Review of Systems  Gastrointestinal:  Positive for abdominal pain, nausea and vomiting.  Genitourinary:  Positive for flank pain and hematuria.  Neurological:  Positive for weakness.  All other systems reviewed and are negative.  Physical Exam Updated Vital Signs BP (!) 138/109   Pulse (!) 137   Temp 98.8 F (37.1 C) (Oral)   Resp (!) 21   Ht 5\' 7"  (1.702 m)   Wt 71.8 kg   LMP 01/07/2006   SpO2 100%    BMI 24.78 kg/m   Physical Exam Vitals and nursing note reviewed.  Constitutional:      General: She is in acute distress.     Appearance: Normal appearance. She is ill-appearing.     Comments: Ill-appearing patient doubled over in pain during my assessment  HENT:     Head: Normocephalic and atraumatic.  Eyes:     General:        Right eye: No discharge.        Left eye: No discharge.  Cardiovascular:  Rate and Rhythm: Regular rhythm. Tachycardia present.     Heart sounds: No murmur heard.   No friction rub. No gallop.  Pulmonary:     Effort: Pulmonary effort is normal.     Comments: Tachypnea, no respiratory distress Abdominal:     Comments: Extreme tenderness to palpation on the right side of the abdomen, especially in the right flank and right back.  Some tenderness to palpation of the left flank as well.  No rebound, rigidity, guarding noted.  Incision site for percutaneous nephrostomy on the right back is clean and well-healed.  Musculoskeletal:        General: No swelling.     Right lower leg: No edema.     Left lower leg: No edema.  Skin:    General: Skin is dry.     Capillary Refill: Capillary refill takes less than 2 seconds.     Coloration: Skin is pale.     Comments: PICC line in place in her right arm, no evidence of infiltration or infection  Neurological:     Mental Status: She is alert and oriented to person, place, and time.  Psychiatric:        Mood and Affect: Mood normal.        Behavior: Behavior normal.    ED Results / Procedures / Treatments   Labs (all labs ordered are listed, but only abnormal results are displayed) Labs Reviewed  LACTIC ACID, PLASMA - Abnormal; Notable for the following components:      Result Value   Lactic Acid, Venous 3.2 (*)    All other components within normal limits  COMPREHENSIVE METABOLIC PANEL - Abnormal; Notable for the following components:   Sodium 134 (*)    Potassium 3.2 (*)    Chloride 97 (*)    Glucose,  Bld 186 (*)    Total Protein 8.3 (*)    Alkaline Phosphatase 257 (*)    All other components within normal limits  CBC WITH DIFFERENTIAL/PLATELET - Abnormal; Notable for the following components:   WBC 75.5 (*)    Neutro Abs 65.7 (*)    Lymphs Abs 9.1 (*)    Monocytes Absolute 0.0 (*)    Basophils Absolute 0.8 (*)    All other components within normal limits  I-STAT BETA HCG BLOOD, ED ([facility], [facility], [facility] ONLY) - Abnormal; Notable for the following components:   I-stat hCG, quantitative 7.5 (*)    All other components within normal limits  RESP PANEL BY RT-PCR (FLU A&B, COVID) ARPGX2  CULTURE, BLOOD (SINGLE)  URINE CULTURE  PROTIME-INR  APTT  HCG, QUANTITATIVE, PREGNANCY  MAGNESIUM  LACTIC ACID, PLASMA  URINALYSIS, ROUTINE W REFLEX MICROSCOPIC    EKG EKG Interpretation  Date/Time:  Monday October 22 2020 21:22:32 EDT Ventricular Rate:  143 PR Interval:  111 QRS Duration: 110 QT Interval:  391 QTC Calculation: 604 R Axis:   86 Text Interpretation: Sinus tachycardia LAE, consider biatrial enlargement Left ventricular hypertrophy Abnrm T, consider ischemia, anterolateral lds Prolonged QT interval Confirmed by Godfrey Pick [phone]) on 10/22/2020 11:08:25 PM  Radiology DG Chest 2 View  Result Date: 10/22/2020 CLINICAL DATA:  Right ureteral stent. Sudden onset of increased flank pain today. EXAM: CHEST - 2 VIEW COMPARISON:  CT 04/25/2020 FINDINGS: Right PICC line with tip over the low SVC region. Normal heart size and pulmonary vascularity. No focal airspace disease or consolidation in the lungs. No blunting of costophrenic angles. No pneumothorax. Mediastinal contours appear intact. Mild thoracic scoliosis convex  towards the right. IMPRESSION: No active cardiopulmonary disease. Electronically Signed   By: Lucienne Capers M.D.   On: 10/22/2020 20:45   CT Renal Stone Study  Result Date: 10/22/2020 CLINICAL DATA:  Flank pain. Kidney stones suspected. Right flank pain, nausea, and vomiting.  EXAM: CT ABDOMEN AND PELVIS WITHOUT CONTRAST TECHNIQUE: Multidetector CT imaging of the abdomen and pelvis was performed following the standard protocol without IV contrast. COMPARISON:  08/10/2020 FINDINGS: Lower chest: Lung bases are clear. Hepatobiliary: No focal liver lesions. Gallbladder is contracted with diffusely increased density. This could represent multiple tiny stones, sludge, or milk of calcium. No bile duct dilatation. No inflammatory changes. Pancreas: Unremarkable. No pancreatic ductal dilatation or surrounding inflammatory changes. Spleen: Normal in size without focal abnormality. Adrenals/Urinary Tract: No adrenal gland nodules. Several stones are demonstrated in both kidneys. Largest is on the left, measuring 10 mm diameter. There is right-sided hydronephrosis with increased density in the urine in the renal pelvis and ureter. This suggests probable hemorrhage. No residual ureteral stone is demonstrated. There is a catheter coiled in the bladder likely representing a previous ureteral stent which has migrated. No bladder wall thickening. No bladder stones identified. Stomach/Bowel: Stomach, small bowel, and colon are not abnormally distended. No wall thickening or inflammatory changes are seen. 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 Scattered stool throughout the colon. Appendix is normal. Vascular/Lymphatic: No significant vascular findings are present. No enlarged abdominal or pelvic lymph nodes. Reproductive: Uterus and bilateral adnexa are unremarkable. Other: No free air or free fluid in the abdomen. Abdominal wall musculature appears intact. 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 IMPRESSION: 1. Multiple bilateral nonobstructing intrarenal stones. 2. Dilated right renal pelvis, calices, and ureter with wall thickening and increased intraluminal density likely representing hemorrhage. 3. A double pigtail catheter is coiled in the  bladder likely representing a previous ureteral catheter that has migrated. Electronically Signed   By: Lucienne Capers M.D.   On: 10/22/2020 20:51    Procedures .Critical Care Performed by: Anselmo Pickler, PA-C Authorized by: Anselmo Pickler, PA-C   Critical care provider statement:    Critical care time (minutes):  38   Critical care was necessary to treat or prevent imminent or life-threatening deterioration of the following conditions:  Sepsis   Critical care was time spent personally by me on the following activities:  Ordering and performing treatments and interventions, ordering and review of laboratory studies, development of treatment plan with patient or surrogate, discussions with consultants, evaluation of patient's response to treatment, re-evaluation of patient's condition, review of old charts, examination of patient and obtaining history from patient or surrogate   Care discussed with: admitting provider     Medications Ordered in ED Medications  lactated ringers infusion ( Intravenous New Bag/Given 10/22/20 2238)  metroNIDAZOLE (FLAGYL) IVPB 500 mg (500 mg Intravenous New Bag/Given 10/22/20 2238)  vancomycin (VANCOREADY) IVPB 1500 mg/300 mL (has no administration in time range)  morphine 4 MG/ML injection 4 mg (has no administration in time range)  lactated ringers bolus 1,500 mL (has no administration in time range)  ondansetron (ZOFRAN) injection 4 mg (4 mg Intravenous Given 10/22/20 2105)  morphine 4 MG/ML injection 4 mg (4 mg Intravenous Given 10/22/20 2125)  lactated ringers bolus 1,000 mL ( Intravenous Restarted 10/22/20 2238)  ceFEPIme (MAXIPIME) 2 g in sodium chloride 0.9 % 100 mL IVPB (0 g Intravenous Stopped 10/22/20 2236)    ED Course  I have reviewed the triage  vital signs and the nursing notes.  Pertinent labs & imaging results that were available during my care of the patient were reviewed by me and considered in my medical decision making  (see chart for details).    MDM Rules/Calculators/A&P                         I discussed this case with my attending physician who cosigned this note including patient's presenting symptoms, physical exam, and planned diagnostics and interventions. Attending physician stated agreement with plan or made changes to plan which were implemented.   Attending physician assessed patient at bedside.  Ill-appearing patient doubled up in pain, extreme tenderness to palpation of the right abdomen, and suprapubic region.  Normotensive to hypertensive, extremely tachycardic, and tachypneic.  Concern for sepsis, nephrolithiasis, complications from colon cancer, complications from recent chemotherapy on 10/7.  CT renal stone study shows multiple bilateral nonobstructing intrarenal stones.  Patient with a dilated right renal pelvis, calyces and ureter with wall thickening, increased density representing hemorrhage.  Double-pigtail catheter is coiled in the bladder, representing migration from percutaneous nephrostomy at the end of September.  Initial lab work significant for elevated alkaline phosphatase, otherwise mostly unremarkable metabolic panel, including normal creatinine.  Consulted with Dr. Claudia Desanctis with Urology who suggests: if admitted, consult urology first thing in the morning and NPO.  Initial lactic acid of 3.1, initial WBC of 75. Patient is on pegfilgrastim for neutropenia. Unclear to what extent white count is 2/2 septic presentation versus  LR bolus and infusion initiated at this time. Code sepsis initiated at this time.  Reconsulted with urology in context of leukocytosis, and lactic acidosis.  Urology will take patient to the OR tonight or early tomorrow morning.  Patient is n.p.o. at this time.  Spoke to the hospitalist for admission for sepsis, hospitalist agrees to admission at this time.  Still pending consult from oncology in regards to patient's significant leukocytosis. Final Clinical  Impression(s) / ED Diagnoses Final diagnoses:  Flank pain  Leukocytosis, unspecified type    Rx / DC Orders ED Discharge Orders     None        Dorien Chihuahua 10/22/20 2323    Godfrey Pick, MD 10/23/20 1349

## 2020-10-22 NOTE — Progress Notes (Signed)
A consult was received from an ED physician for Vancomycin and Cefepime per pharmacy dosing.  The patient's profile has been reviewed for ht/wt/allergies/indication/available labs.    A one time order has been placed for Vancomycin 1500mg  IV and Cefepime 2gm IV.    Further antibiotics/pharmacy consults should be ordered by admitting physician if indicated.                       Thank you, Everette Rank, PharmD 10/22/2020  10:03 PM

## 2020-10-22 NOTE — Sepsis Progress Note (Signed)
Elink following Code Sepsis. 

## 2020-10-22 NOTE — Sepsis Progress Note (Signed)
Notified bedside nurse of need to draw repeat lactic acid. 

## 2020-10-22 NOTE — Consult Note (Signed)
I have been asked to see the patient by Dr. Godfrey Pick, for evaluation and management of right hydronephrosis with sepsis.  History of present illness: 45 year old woman with a history of colon cancer currently undergoing chemotherapy who recently underwent a right PCNL with [facility] urology at the end of September.  Her nephrostomy tube was removed at time of discharge 10/12/20 however she was left with right indwelling ureteral stent.  She presented to the ED earlier today with gross hematuria, nausea, vomiting and pain.  CT of the abdomen pelvis showed right hydronephrosis with likely blood products in collecting system and ureter.  Previously placed right ureteral stent had migrated and is located in bladder.  Patient's WBC returned at 75 with a lactate of 3.2.   Review of systems: A 12 point comprehensive review of systems was obtained and is negative unless otherwise stated in the history of present illness.  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   Current Outpatient Medications on File Prior to Encounter  Medication Sig Dispense Refill   acetaminophen (TYLENOL) 325 MG tablet Take 650 mg by mouth every 6 (six) hours as needed for moderate pain or headache.     amLODipine (NORVASC) 10 MG tablet Take 1 tablet (10 mg total) by mouth daily. 90 tablet 1   dexamethasone (DECADRON) 4 MG tablet Take 1 tablet (4 mg total) by mouth 2 (two) times daily with a meal. Start on day 2 of treatment and take for 3 days with each  cycle. 12 tablet 1   diphenoxylate-atropine (LOMOTIL) 2.5-0.025 MG tablet Take 1 tablet by mouth 4 (four) times daily as needed for diarrhea or loose stools. 30 tablet 0   doxycycline (VIBRA-TABS) 100 MG tablet Take 1 tablet (100 mg total) by mouth 2 (two) times daily. Begin day before first treatment 60 tablet 2   hydrALAZINE (APRESOLINE) 10 MG tablet TAKE 1 TABLET(10 MG) BY MOUTH THREE TIMES DAILY 90 tablet 1   hydrochlorothiazide (HYDRODIURIL) 25 MG tablet TAKE 1 TABLET BY MOUTH EVERY DAY 30 tablet 3   HYDROmorphone (DILAUDID) 2 MG tablet Take 1 tablet (2 mg total) by mouth every 12 (twelve) hours as needed for severe pain. 30 tablet 0   ibuprofen (ADVIL) 800 MG tablet Take 1 tablet (800 mg total) by mouth daily as needed for moderate pain. 30 tablet 0   metoprolol tartrate (LOPRESSOR) 50 MG tablet TAKE 1 TABLET(50 MG) BY MOUTH TWICE DAILY 180 tablet 0   ondansetron (ZOFRAN) 4 MG tablet Take 1 tablet (4 mg total) by mouth every 8 (eight) hours as needed for nausea or vomiting. Do not take for 3 days after chemo 20 tablet 2   oxybutynin (DITROPAN-XL) 10 MG 24 hr tablet Take 10 mg by mouth daily.     potassium chloride (KLOR-CON) 10 MEQ tablet Take 1 tablet (10 mEq total) by mouth daily. 30 tablet 3   prochlorperazine (COMPAZINE) 10 MG tablet Take 1 tablet (10 mg total) by mouth every 6 (six) hours as needed for nausea or vomiting. 40 tablet  2   sorbitol 70 % solution Take 15 mLs by mouth 2 (two) times daily. (Patient not taking: No sig reported) 473 mL 0   tiZANidine (ZANAFLEX) 4 MG tablet TAKE 1/2 TO 1 TABLET(2 TO 4 MG) BY MOUTH EVERY 8 HOURS AS NEEDED FOR MUSCLE SPASMS 30 tablet 1    Past Medical History:  Diagnosis Date   Hypertension    Renal calculi    bilateral    Past Surgical History:  Procedure Laterality Date   CESAREAN SECTION     x2   IR RADIOLOGIST EVAL & MGMT  09/16/2019   IR RADIOLOGIST EVAL & MGMT  09/23/2019   IR RADIOLOGIST EVAL & MGMT  09/20/2019   IR RADIOLOGIST EVAL &  MGMT  09/30/2019   IR REMOVAL TUN ACCESS W/ PORT W/O FL MOD SED  09/14/2019   URETERAL STENT PLACEMENT      Social History   Tobacco Use   Smoking status: Every Day    Packs/day: 0.50    Types: Cigarettes   Smokeless tobacco: Never   Tobacco comments:    In the process of quitting/ has been able to cut back  Vaping Use   Vaping Use: Never used  Substance Use Topics   Alcohol use: Yes   Drug use: Not Currently    Family History  Problem Relation Age of Onset   Hypertension Mother    Diabetes Mellitus II Father    Stroke Father    Ulcers Sister     PE: Vitals:   10/22/20 2115 10/22/20 2130 10/22/20 2200 10/22/20 2230  BP: (!) 148/129 (!) 148/132 (!) 143/127 (!) 138/109  Pulse: (!) 154 (!) 142 (!) 135 (!) 137  Resp:  14 (!) 23 (!) 21  Temp:      TempSrc:      SpO2: 99% 100% 97% 100%  Weight:      Height:       Patient appears to be in no acute distress  patient is alert and oriented x3 Atraumatic normocephalic head No increased work of breathing, no audible wheezes/rhonchi Regular sinus rhythm/rate Abdomen is soft, nontender, nondistended Lower extremities are symmetric without appreciable edema Grossly neurologically intact No identifiable skin lesions  Recent Labs    10/22/20 2022  WBC 75.5*  HGB 14.6  HCT 43.5   Recent Labs    10/22/20 2022  NA 134*  K 3.2*  CL 97*  CO2 25  GLUCOSE 186*  BUN 20  CREATININE 0.92  CALCIUM 9.6   Recent Labs    10/22/20 2022  INR 1.0   No results for input(s): LABURIN in the last 72 hours. Results for orders placed or performed in visit on 11/28/19  Urine Culture     Status: None   Collection Time: 11/28/19  1:25 PM   Specimen: Urine, Clean Catch  Result Value Ref Range Status   Specimen Description   Final    URINE, CLEAN CATCH Performed at [facility], [address] [place], [place] [address]    Special Requests   Final    NONE Performed at [facility], [place] [address], [place], [place] [address]    Culture   Final    NO GROWTH Performed at [facility], [place] [address], [place], [place] [address]    Report Status 11/29/2019 FINAL  Final    Imaging: CT Abd/Pelvis IMPRESSION: 1. Multiple bilateral nonobstructing intrarenal stones. 2. Dilated right renal pelvis, calices, and  ureter with wall thickening and increased intraluminal density likely representing hemorrhage. 3. A double pigtail catheter is coiled in the bladder likely representing a previous ureteral catheter that has migrated.     Electronically Signed   By: Lucienne Capers M.D.   On: 10/22/2020 20:51  Imp/Recommendations: Right hydronephrosis with sepsis: -Patient meets clinical criteria for sepsis -Given CT scan findings and clinical lab data, will remove stent that is coiled in bladder and attempt to replace stent on right side; discussed with patient this may not be successful and that she may ultimately need a repeat nephrostomy tube depending how she responds to fluids and antibiotics -Treat empirically with antibiotics and resuscitate -Patient to be admitted for medicine for treatment of sepsis -Ultimately patient will need outpatient follow up with her Urologist at [facility]   Thank you for involving me in this patient's care.  Please page with any further questions or concerns. Blima Jaimes D Mayline Dragon

## 2020-10-22 NOTE — ED Provider Notes (Signed)
Emergency Medicine Provider Triage Evaluation Note  April [name] , a 45 y.o. female  was evaluated in triage.  Pt complains of abdominal pain, right flank pain, nausea, and vomiting.  Patient reports that her pain started suddenly this afternoon.  Patient has had multiple episodes of nausea and vomiting since then.  Describes emesis as yellow.  Patient reports that she had recent surgery for kidney stones and has a urethral stent in place.  Patient endorses hematuria.  Patient has history of colon cancer with last chemotherapy treatment 10/7.  Review of Systems  Positive: Abdominal pain, nausea, vomiting, hematuria, chills Negative: Fevers, blood in stool, melena  Physical Exam  BP (!) 158/119 (BP Location: Left Arm)   Pulse (!) 129   Temp 98.8 F (37.1 C) (Oral)   Resp (!) 32   Ht 5\' 7"  (1.702 m)   Wt 71.8 kg   SpO2 99%   BMI 24.78 kg/m  Gen:   Awake, no distress   Resp:  Normal effort  MSK:   Moves extremities without difficulty  Other:  Abdomen soft, nondistended, tenderness to right upper and lower quadrant.  Medical Decision Making  Medically screening exam initiated at 8:24 PM.  Appropriate orders placed.  Cordella Register was informed that the remainder of the evaluation will be completed by another provider, this initial triage assessment does not replace that evaluation, and the importance of remaining in the ED until their evaluation is complete.  Patient is little to only back to next available room.  Concern for possible sepsis.   Loni Beckwith, PA-C 10/22/20 2026    Valarie Merino, MD 10/22/20 2329

## 2020-10-22 NOTE — Anesthesia Preprocedure Evaluation (Addendum)
Anesthesia Evaluation  Patient identified by MRN, date of birth, ID band Patient awake    Reviewed: Allergy & Precautions, NPO status , Patient's Chart, lab work & pertinent test results, reviewed documented beta blocker date and time   History of Anesthesia Complications Negative for: history of anesthetic complications  Airway Mallampati: II  TM Distance: >3 FB Neck ROM: Full    Dental no notable dental hx.    Pulmonary Current Smoker,    Pulmonary exam normal        Cardiovascular hypertension, Pt. on medications and Pt. on home beta blockers Normal cardiovascular exam     Neuro/Psych negative neurological ROS  negative psych ROS   GI/Hepatic Neg liver ROS, Colon ca on chemotherapy   Endo/Other  negative endocrine ROS  Renal/GU Sepsis from urinary source  negative genitourinary   Musculoskeletal negative musculoskeletal ROS (+)   Abdominal   Peds  Hematology negative hematology ROS (+)   Anesthesia Other Findings Day of surgery medications reviewed with patient.  Reproductive/Obstetrics negative OB ROS                            Anesthesia Physical Anesthesia Plan  ASA: 3 and emergent  Anesthesia Plan: General   Post-op Pain Management:    Induction: Intravenous  PONV Risk Score and Plan: 4 or greater and Treatment may vary due to age or medical condition, Ondansetron, Dexamethasone and Midazolam  Airway Management Planned: Oral ETT and Video Laryngoscope Planned  Additional Equipment: None  Intra-op Plan:   Post-operative Plan: Extubation in OR  Informed Consent: I have reviewed the patients History and Physical, chart, labs and discussed the procedure including the risks, benefits and alternatives for the proposed anesthesia with the patient or authorized representative who has indicated his/her understanding and acceptance.     Dental advisory given  Plan  Discussed with: CRNA  Anesthesia Plan Comments:         Anesthesia Quick Evaluation

## 2020-10-23 ENCOUNTER — Inpatient Hospital Stay (HOSPITAL_COMMUNITY): Payer: Medicaid Other

## 2020-10-23 ENCOUNTER — Encounter (HOSPITAL_COMMUNITY): Payer: Self-pay | Admitting: Family Medicine

## 2020-10-23 DIAGNOSIS — R6510 Systemic inflammatory response syndrome (SIRS) of non-infectious origin without acute organ dysfunction: Secondary | ICD-10-CM | POA: Diagnosis present

## 2020-10-23 DIAGNOSIS — T83122A Displacement of urinary stent, initial encounter: Secondary | ICD-10-CM | POA: Diagnosis not present

## 2020-10-23 DIAGNOSIS — N200 Calculus of kidney: Secondary | ICD-10-CM

## 2020-10-23 DIAGNOSIS — A419 Sepsis, unspecified organism: Secondary | ICD-10-CM | POA: Diagnosis present

## 2020-10-23 DIAGNOSIS — N1330 Unspecified hydronephrosis: Secondary | ICD-10-CM | POA: Diagnosis present

## 2020-10-23 LAB — HIV ANTIBODY (ROUTINE TESTING W REFLEX): HIV Screen 4th Generation wRfx: NONREACTIVE

## 2020-10-23 LAB — BASIC METABOLIC PANEL
Anion gap: 8 (ref 5–15)
BUN: 16 mg/dL (ref 6–20)
CO2: 27 mmol/L (ref 22–32)
Calcium: 8.7 mg/dL — ABNORMAL LOW (ref 8.9–10.3)
Chloride: 98 mmol/L (ref 98–111)
Creatinine, Ser: 0.56 mg/dL (ref 0.44–1.00)
GFR, Estimated: >60 mL/min (ref >60)
Glucose, Bld: 134 mg/dL — ABNORMAL HIGH (ref 70–99)
Potassium: 3.5 mmol/L (ref 3.5–5.1)
Sodium: 133 mmol/L — ABNORMAL LOW (ref 135–145)

## 2020-10-23 LAB — HEPATIC FUNCTION PANEL
ALT: 16 U/L (ref 0–44)
AST: 16 U/L (ref 15–41)
Albumin: 3.9 g/dL (ref 3.5–5.0)
Alkaline Phosphatase: 183 U/L — ABNORMAL HIGH (ref 38–126)
Bilirubin, Direct: 0.1 mg/dL (ref 0.0–0.2)
Indirect Bilirubin: 0.7 mg/dL (ref 0.3–0.9)
Total Bilirubin: 0.8 mg/dL (ref 0.3–1.2)
Total Protein: 6.8 g/dL (ref 6.5–8.1)

## 2020-10-23 LAB — CBC
HCT: 35.4 % — ABNORMAL LOW (ref 36.0–46.0)
Hemoglobin: 11.9 g/dL — ABNORMAL LOW (ref 12.0–15.0)
MCH: 32.4 pg (ref 26.0–34.0)
MCHC: 33.6 g/dL (ref 30.0–36.0)
MCV: 96.5 fL (ref 80.0–100.0)
Platelets: 293 10*3/uL (ref 150–400)
RBC: 3.67 MIL/uL — ABNORMAL LOW (ref 3.87–5.11)
RDW: 13.5 % (ref 11.5–15.5)
WBC: 58.2 10*3/uL (ref 4.0–10.5)
nRBC: 0 % (ref 0.0–0.2)

## 2020-10-23 LAB — LACTIC ACID, PLASMA: Lactic Acid, Venous: 1.1 mmol/L (ref 0.5–1.9)

## 2020-10-23 LAB — MRSA NEXT GEN BY PCR, NASAL: MRSA by PCR Next Gen: NOT DETECTED

## 2020-10-23 MED ORDER — ACETAMINOPHEN 650 MG RE SUPP
650.0000 mg | Freq: Four times a day (QID) | RECTAL | Status: DC | PRN
  Filled 2020-10-23: qty 1

## 2020-10-23 MED ORDER — HYDRALAZINE HCL 25 MG PO TABS: 25.0000 mg | ORAL_TABLET | Freq: Four times a day (QID) | ORAL | Status: DC | PRN

## 2020-10-23 MED ORDER — SUCCINYLCHOLINE CHLORIDE 200 MG/10ML IV SOSY
PREFILLED_SYRINGE | INTRAVENOUS | Status: DC | PRN
  Administered 2020-10-22: 120 mg via INTRAVENOUS

## 2020-10-23 MED ORDER — ONDANSETRON HCL 4 MG PO TABS
4.0000 mg | ORAL_TABLET | Freq: Four times a day (QID) | ORAL | Status: DC | PRN
  Filled 2020-10-23: qty 1

## 2020-10-23 MED ORDER — IBUPROFEN 800 MG PO TABS
800.0000 mg | ORAL_TABLET | Freq: Once | ORAL | Status: AC
  Administered 2020-10-23: 800 mg via ORAL
  Filled 2020-10-23: qty 1

## 2020-10-23 MED ORDER — POTASSIUM CHLORIDE 10 MEQ/100ML IV SOLN
10.0000 meq | INTRAVENOUS | Status: AC
Start: 2020-10-23 — End: 2020-10-23
  Administered 2020-10-23 (×2): 10 meq via INTRAVENOUS
  Filled 2020-10-23: qty 100

## 2020-10-23 MED ORDER — HYDRALAZINE HCL 25 MG PO TABS: 25.0000 mg | ORAL_TABLET | ORAL | Status: DC | PRN

## 2020-10-23 MED ORDER — OXYBUTYNIN CHLORIDE 5 MG PO TABS
5.0000 mg | ORAL_TABLET | Freq: Three times a day (TID) | ORAL | Status: DC
  Administered 2020-10-23 – 2020-10-25 (×6): 5 mg via ORAL
  Filled 2020-10-23 (×6): qty 1

## 2020-10-23 MED ORDER — ACETAMINOPHEN 650 MG RE SUPP: 650.0000 mg | RECTAL | Status: DC | PRN

## 2020-10-23 MED ORDER — MIDAZOLAM HCL 5 MG/5ML IJ SOLN
INTRAMUSCULAR | Status: DC | PRN
  Administered 2020-10-22: 2 mg via INTRAVENOUS

## 2020-10-23 MED ORDER — SODIUM CHLORIDE 0.9 % IV SOLN
2.0000 g | Freq: Three times a day (TID) | INTRAVENOUS | Status: DC
  Administered 2020-10-23 – 2020-10-25 (×7): 2 g via INTRAVENOUS
  Filled 2020-10-23 (×7): qty 2

## 2020-10-23 MED ORDER — CHLORHEXIDINE GLUCONATE CLOTH 2 % EX PADS
6.0000 | MEDICATED_PAD | Freq: Every day | CUTANEOUS | Status: DC
  Administered 2020-10-24 (×2): 6 via TOPICAL

## 2020-10-23 MED ORDER — AMLODIPINE BESYLATE 10 MG PO TABS
10.0000 mg | ORAL_TABLET | Freq: Every day | ORAL | Status: DC
  Administered 2020-10-23 – 2020-10-25 (×3): 10 mg via ORAL
  Filled 2020-10-23 (×3): qty 1

## 2020-10-23 MED ORDER — OXYCODONE HCL 5 MG/5ML PO SOLN: 5.0000 mg | Freq: Once | ORAL | Status: DC | PRN

## 2020-10-23 MED ORDER — ACETAMINOPHEN 10 MG/ML IV SOLN
INTRAVENOUS | Status: DC | PRN
  Administered 2020-10-23: 1000 mg via INTRAVENOUS

## 2020-10-23 MED ORDER — PROPOFOL 10 MG/ML IV BOLUS
INTRAVENOUS | Status: DC | PRN
  Administered 2020-10-22: 120 mg via INTRAVENOUS

## 2020-10-23 MED ORDER — FENTANYL CITRATE (PF) 100 MCG/2ML IJ SOLN
INTRAMUSCULAR | Status: DC | PRN
  Administered 2020-10-22: 50 ug via INTRAVENOUS

## 2020-10-23 MED ORDER — HYDRALAZINE HCL 10 MG PO TABS
10.0000 mg | ORAL_TABLET | Freq: Three times a day (TID) | ORAL | Status: DC
  Administered 2020-10-23 – 2020-10-25 (×6): 10 mg via ORAL
  Filled 2020-10-23 (×6): qty 1

## 2020-10-23 MED ORDER — LACTATED RINGERS IV SOLN
INTRAVENOUS | Status: DC
  Administered 2020-10-23 (×2): 1000 mL via INTRAVENOUS

## 2020-10-23 MED ORDER — ONDANSETRON HCL 4 MG/2ML IJ SOLN: 4.0000 mg | Freq: Four times a day (QID) | INTRAMUSCULAR | Status: DC | PRN

## 2020-10-23 MED ORDER — SODIUM CHLORIDE 0.9 % IR SOLN
Status: DC | PRN
  Administered 2020-10-22: 1000 mL

## 2020-10-23 MED ORDER — DEXAMETHASONE SODIUM PHOSPHATE 10 MG/ML IJ SOLN
INTRAMUSCULAR | Status: DC | PRN
  Administered 2020-10-23: 5 mg via INTRAVENOUS

## 2020-10-23 MED ORDER — SORBITOL 70 % PO SOLN: 15.0000 mL | Freq: Two times a day (BID) | ORAL | Status: DC | PRN

## 2020-10-23 MED ORDER — IOHEXOL 300 MG/ML  SOLN
INTRAMUSCULAR | Status: DC | PRN
  Administered 2020-10-22: 5 mL via URETHRAL

## 2020-10-23 MED ORDER — PROMETHAZINE HCL 25 MG/ML IJ SOLN
6.2500 mg | INTRAMUSCULAR | Status: DC | PRN
Start: 2020-10-23 — End: 2020-10-23

## 2020-10-23 MED ORDER — OXYCODONE HCL 5 MG PO TABS: 5.0000 mg | ORAL_TABLET | Freq: Once | ORAL | Status: DC | PRN

## 2020-10-23 MED ORDER — CHLORHEXIDINE GLUCONATE CLOTH 2 % EX PADS: 6.0000 | MEDICATED_PAD | Freq: Every day | CUTANEOUS | Status: DC

## 2020-10-23 MED ORDER — LABETALOL HCL 5 MG/ML IV SOLN
10.0000 mg | INTRAVENOUS | Status: DC | PRN
  Administered 2020-10-23: 10 mg via INTRAVENOUS
  Filled 2020-10-23: qty 4

## 2020-10-23 MED ORDER — ACETAMINOPHEN 325 MG PO TABS
650.0000 mg | ORAL_TABLET | ORAL | Status: DC | PRN
  Administered 2020-10-23: 650 mg via ORAL
  Filled 2020-10-23: qty 2

## 2020-10-23 MED ORDER — ACETAMINOPHEN 325 MG PO TABS: 650.0000 mg | ORAL_TABLET | Freq: Four times a day (QID) | ORAL | Status: DC | PRN

## 2020-10-23 MED ORDER — MORPHINE SULFATE (PF) 4 MG/ML IV SOLN
2.0000 mg | INTRAVENOUS | Status: DC | PRN
Start: 2020-10-23 — End: 2020-10-25
  Administered 2020-10-23 (×2): 4 mg via INTRAVENOUS
  Administered 2020-10-24: 2 mg via INTRAVENOUS
  Filled 2020-10-23 (×4): qty 1

## 2020-10-23 MED ORDER — STERILE WATER FOR IRRIGATION IR SOLN
Status: DC | PRN
  Administered 2020-10-23: 1000 mL

## 2020-10-23 MED ORDER — POTASSIUM CHLORIDE 10 MEQ/100ML IV SOLN: 10.0000 meq | INTRAVENOUS | Status: DC

## 2020-10-23 MED ORDER — DOCUSATE SODIUM 100 MG PO CAPS: 100.0000 mg | ORAL_CAPSULE | Freq: Two times a day (BID) | ORAL | Status: DC | PRN

## 2020-10-23 MED ORDER — FENTANYL CITRATE PF 50 MCG/ML IJ SOSY: 25.0000 ug | PREFILLED_SYRINGE | INTRAMUSCULAR | Status: DC | PRN

## 2020-10-23 MED ORDER — LIDOCAINE 2% (20 MG/ML) 5 ML SYRINGE
INTRAMUSCULAR | Status: DC | PRN
  Administered 2020-10-22: 80 mg via INTRAVENOUS

## 2020-10-23 MED ORDER — METOPROLOL TARTRATE 25 MG PO TABS
25.0000 mg | ORAL_TABLET | Freq: Two times a day (BID) | ORAL | Status: DC
  Administered 2020-10-23 – 2020-10-25 (×5): 25 mg via ORAL
  Filled 2020-10-23 (×5): qty 1

## 2020-10-23 NOTE — Anesthesia Procedure Notes (Signed)
Procedure Name: Intubation Date/Time: 10/23/2020 12:00 AM Performed by: Cleda Daub, CRNA Pre-anesthesia Checklist: Patient identified, Emergency Drugs available, Suction available and Patient being monitored Patient Re-evaluated:Patient Re-evaluated prior to induction Oxygen Delivery Method: Circle system utilized Preoxygenation: Pre-oxygenation with 100% oxygen Induction Type: IV induction, Cricoid Pressure applied and Rapid sequence Laryngoscope Size: Glidescope and 4 Grade View: Grade I Tube type: Oral Number of attempts: 1 Airway Equipment and Method: Stylet and Oral airway Placement Confirmation: ETT inserted through vocal cords under direct vision, positive ETCO2 and breath sounds checked- equal and bilateral Secured at: 22 cm Tube secured with: Tape Dental Injury: Teeth and Oropharynx as per pre-operative assessment  Comments: Videoscope intubation for N&V

## 2020-10-23 NOTE — TOC Initial Note (Signed)
Transition of Care [facility]) - Initial/Assessment Note    Patient Details  Name: April [name] MRN: [id number] Date of Birth: 02/20/1975  [facility] CM/SW Contact:    [name], RN Phone Number: 10/23/2020, 8:08 AM  Clinical Narrative:                 Preoperative diagnosis:  Right hydronephrosis Malpositioned right ureteral stent Sepsis secondary to urinary tract infection    Postoperative diagnosis:   Right hydronephrosis Malpositioned right ureteral stent Sepsis secondary to urinary tract infection    Procedure:   Cystoscopy right ureteral stent placement right retrograde pyelography with interpretation  Removal of existing malpositioned right ureteral stent   Surgeon: Jacalyn Lefevre, MD   Anesthesia: General   Complications: None   Intraoperative findings:  Normal anterior urethra  Right retrograde pyelography with hydronephrosis to bladder and difficulty passing contrast to collecting system Malpositioned right ureteral stent seen coiled in bladder 6Fr x 26 cm right JJ stent no tether Bladder mucosa difficult to visualize secondary to hematuria  TOC PLAN OF CARE: Following for progression of care and toc needs. From home and lives alone does have family support near by.  Plan is to return to home.  Expected Discharge Plan: Home/Self Care Barriers to Discharge: Continued Medical Work up   Patient Goals and CMS Choice Patient states their goals for this hospitalization and ongoing recovery are:: to go home CMS Medicare.gov Compare Post Acute Care list provided to:: Patient    Expected Discharge Plan and Services Expected Discharge Plan: Home/Self Care   Discharge Planning Services: CM Consult   Living arrangements for the past 2 months: Single Family Home                                      Prior Living Arrangements/Services Living arrangements for the past 2 months: Single Family Home Lives with:: Self Patient  language and need for interpreter reviewed:: Yes Do you feel safe going back to the place where you live?: Yes      Need for Family Participation in Patient Care: No (Comment) Care giver support system in place?: No (comment)   Criminal Activity/Legal Involvement Pertinent to Current Situation/Hospitalization: No - Comment as needed  Activities of Daily Living      Permission Sought/Granted                  Emotional Assessment Appearance:: Appears stated age     Orientation: : Oriented to Self, Oriented to Place, Oriented to  Time, Oriented to Situation Alcohol / Substance Use: Not Applicable Psych Involvement: No (comment)  Admission diagnosis:  Flank pain [R10.9] Ureteral stent displacement (HCC) [T83.122A] Leukocytosis, unspecified type [D72.829] Patient Active Problem List   Diagnosis Date Noted   Renal calculi    SIRS (systemic inflammatory response syndrome) ([place])    Hydronephrosis of right kidney    Ureteral stent displacement ([place]) 10/22/2020   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   Port-A-Cath in place 11/22/2019   Goals of care, counseling/discussion 07/21/2019   Malignant neoplasm of rectosigmoid (colon) ([place]) 04/08/2019   Sepsis secondary to UTI ([place]) 01/07/2018   Hypertension 01/07/2018   Sinus tachycardia 01/07/2018   Hypokalemia 01/07/2018   Prolonged QT interval 01/07/2018   Hyperglycemia 01/07/2018   Tobacco use 01/07/2018   PCP:  Ruben Im, PA-C Pharmacy:   [facility] [place] -  [place] [address] Phone: [phone] Fax: [phone]  [place] [address] Phone: [phone] Fax: [phone]  [id number] - [place], [place] - [address] [place] [address] Phone: [phone] Fax: [phone]     Social  Determinants of Health (SDOH) Interventions    Readmission Risk Interventions No flowsheet data found.

## 2020-10-23 NOTE — Progress Notes (Signed)
Progress Note    Kameela Leipold   JKD:[id number]  DOB: [birth year]  DOA: 10/22/2020     1 Date of Service: 10/23/2020   Ms.Catlyn Shipton is a 45 y.o. 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 Patient reports that she has a right ureteral stent and had a right percutaneous nephrostomy tube that was removed on 10/12/2020.  She did underwent chemotherapy on 10/17/2020 and was treated with pegfilgrastrim on 10/19/2020.  She has been experiencing fatigue, nausea, and some vomiting which she attributed to the chemotherapy but then developed acute onset of severe right flank pain and gross hematuria the afternoon of admission.    Upon arrival to the ED, patient is found to be afebrile, saturating mid to upper 90s on room air, tachycardic to 150s, and with stable blood pressure. CT stone study notable for multiple bilateral nonobstructing intrarenal stones and concerning for dilated right renal pelvis, calyces, and ureter with wall thickening likely representing hemorrhage.  Also noted on the CT is a double-pigtail catheter coiled within the bladder.   Cultures were collected in the ER and patient was started on vancomycin, cefepime, Flagyl.  Urology was consulted and patient underwent cystoscopy with malpositioned stent removal and replaced with right ureteral stent.   Subjective:  Resting in bed comfortably this morning.  Still having hematuria draining from Foley, no clots noted.  Flank pain has improved.  Hospital Problems * Hydronephrosis of right kidney - considered due to displaced R ureteral stent; now s/p stent removal and replacement   SIRS (systemic inflammatory response syndrome) (HCC) - Afebrile, tachycardia, leukocytosis; UA unable to be interpreted - on empiric abx for now; watch cultures   Ureteral stent displacement ([place]) - Recent right ureteral stent noted to be  malpositioned on imaging on admission - Underwent cystoscopy with removal of malpositioned right ureteral stent and new stent placed -Continue antibiotics and follow-up cultures -Continue Foley, currently draining gross hematuria but no clots  Malignant neoplasm of rectosigmoid (colon) ([place]) - undergoing active chemo outpatient; follows with Dr. Benay Spice   Renal calculi - see ureteral stent displacement   Prolonged QT interval - Continue telemetry - replacing electrolytes PRN  Hypokalemia - Replete and recheck as needed  Hypertension - Resume amlodipine, hydralazine, Lopressor     Objective Vital signs were reviewed and unremarkable.  Vitals:   10/23/20 0900 10/23/20 0942 10/23/20 1000 10/23/20 1100  BP: (!) 154/104 (!) 156/112 (!) 145/105 (!) 126/99  Pulse:    95  Resp: 14 17 16 17   Temp:      TempSrc:      SpO2: 99%   100%  Weight:      Height:       71.8 kg  Exam General appearance: alert, cooperative, and no distress Head: Normocephalic, without obvious abnormality, atraumatic Eyes:  EOMI Lungs: clear to auscultation bilaterally Heart: regular rate and rhythm and S1, S2 normal Abdomen: normal findings: bowel sounds normal and soft, non-tender GU: foley in place with hematuria in foley bag, no clots Extremities:  no edema Skin: mobility and turgor normal Neurologic: Grossly normal   Labs / Other Information My review of labs, imaging, notes and other tests is significant for Hgb 11.9 g/dL, WBC 58.2   Time spent: Greater than 50% of the 35 minute visit was spent in counseling/coordination of care for the patient as laid out in the A&P.  Dwyane Dee, MD [facility]  10/23/2020, 12:07 PM

## 2020-10-23 NOTE — Anesthesia Postprocedure Evaluation (Signed)
Anesthesia Post Note  Patient: April [name]  Procedure(s) Performed: CYSTOSCOPY WITH RETROGRADE PYELOGRAM, URETEROSCOPY AND STENT PLACEMENT (Right: Ureter)     Patient location during evaluation: PACU Anesthesia Type: General Level of consciousness: awake and alert and oriented Pain management: pain level controlled Vital Signs Assessment: post-procedure vital signs reviewed and stable Respiratory status: spontaneous breathing, nonlabored ventilation and respiratory function stable Cardiovascular status: blood pressure returned to baseline Postop Assessment: no apparent nausea or vomiting Anesthetic complications: no   No notable events documented.        Marthenia Rolling

## 2020-10-23 NOTE — Progress Notes (Signed)
Urology Inpatient Progress Report    Intv/Subj: No acute issues overnight.  Principal Problem:   Hydronephrosis of right kidney Active Problems:   Hypertension   Hypokalemia   Prolonged QT interval   Malignant neoplasm of rectosigmoid (colon) (HCC)   Ureteral stent displacement (HCC)   Renal calculi   SIRS (systemic inflammatory response syndrome) (HCC)  Current Facility-Administered Medications  Medication Dose Route Frequency Provider Last Rate Last Admin   acetaminophen (TYLENOL) tablet 650 mg  650 mg Oral Q4H PRN Dwyane Dee, MD   650 mg at 10/23/20 1306   Or   acetaminophen (TYLENOL) suppository 650 mg  650 mg Rectal Q4H PRN Dwyane Dee, MD       amLODipine (NORVASC) tablet 10 mg  10 mg Oral Daily Dwyane Dee, MD   10 mg at 10/23/20 1302   ceFEPIme (MAXIPIME) 2 g in sodium chloride 0.9 % 100 mL IVPB  2 g Intravenous Q8H Poindexter, Leann T, RPH   Stopped at 10/24/20 0549   Chlorhexidine Gluconate Cloth 2 % PADS 6 each  6 each Topical QHS Dwyane Dee, MD       docusate sodium (COLACE) capsule 100 mg  100 mg Oral BID PRN Dwyane Dee, MD       hydrALAZINE (APRESOLINE) tablet 10 mg  10 mg Oral TID Dwyane Dee, MD   10 mg at 10/23/20 2050   hydrALAZINE (APRESOLINE) tablet 25 mg  25 mg Oral Q4H PRN Dwyane Dee, MD       ibuprofen (ADVIL) tablet 400 mg  400 mg Oral Q6H PRN Dwyane Dee, MD       labetalol (NORMODYNE) injection 10 mg  10 mg Intravenous Q4H PRN Dwyane Dee, MD   10 mg at 10/23/20 0958   metoprolol tartrate (LOPRESSOR) tablet 25 mg  25 mg Oral BID Dwyane Dee, MD   25 mg at 10/23/20 2050   morphine 4 MG/ML injection 2-4 mg  2-4 mg Intravenous Q4H PRN Opyd, Ilene Qua, MD   4 mg at 10/23/20 1759   oxybutynin (DITROPAN) tablet 5 mg  5 mg Oral TID Dwyane Dee, MD   5 mg at 10/23/20 2050   sorbitol 70 % solution 15 mL  15 mL Oral BID PRN Dwyane Dee, MD         Objective: Vital: Vitals:   10/24/20 0600 10/24/20 0700 10/24/20 0800  10/24/20 0900  BP: 99/71 (!) 129/91 111/72 124/86  Pulse:   79   Resp: 18 18 18 17   Temp:   98.3 F (36.8 C)   TempSrc:   Oral   SpO2:   99% 100%  Weight:      Height:       I/Os: I/O last 3 completed shifts: In: 5394.4 [P.O.:50; I.V.:3406.6; Other:1000; IV Piggyback:937.9] Out: 2694 [WNIOE:7035; Blood:2]  Physical Exam:  General: Patient is in no apparent distress Lungs: Normal respiratory effort, chest expands symmetrically. GI: he abdomen is soft and nontender  Foley: 22 French Foley catheter draining light tea colored urine with small clots Ext: lower extremities symmetric  Lab Results: Recent Labs    10/22/20 2022 10/23/20 0345 10/24/20 0654  WBC 75.5* 58.2* 18.8*  HGB 14.6 11.9* 9.7*  HCT 43.5 35.4* 29.1*   Recent Labs    10/22/20 2022 10/23/20 0345 10/24/20 0654  NA 134* 133* 137  K 3.2* 3.5 3.2*  CL 97* 98 103  CO2 25 27 30   GLUCOSE 186* 134* 94  BUN 20 16 12   CREATININE 0.92 0.56 0.49  CALCIUM 9.6 8.7* 8.1*   Recent Labs    10/22/20 2022  INR 1.0   No results for input(s): LABURIN in the last 72 hours. Results for orders placed or performed during the hospital encounter of 10/22/20  Blood culture (routine single)     Status: None (Preliminary result)   Collection Time: 10/22/20  8:22 PM   Specimen: BLOOD  Result Value Ref Range Status   Specimen Description   Final    BLOOD PICC LINE Performed at [facility], [place] [address], [place], [place] [address]    Special Requests   Final    BOTTLES DRAWN AEROBIC AND ANAEROBIC Blood Culture adequate volume Performed at [facility] [address], [place], [place] [address]    Culture   Final    NO GROWTH 1 DAY Performed at [facility], [place] [address] [place], [place] [address]    Report Status PENDING  Incomplete  Resp Panel by RT-PCR (Flu A&B, Covid) Nasopharyngeal Swab     Status: None   Collection Time: 10/22/20  9:25 PM   Specimen:  Nasopharyngeal Swab; Nasopharyngeal(NP) swabs in vial transport medium  Result Value Ref Range Status   SARS Coronavirus 2 by RT PCR NEGATIVE NEGATIVE Final    Comment: (NOTE) SARS-CoV-2 target nucleic acids are NOT DETECTED.  The SARS-CoV-2 RNA is generally detectable in upper respiratory specimens during the acute phase of infection. The lowest concentration of SARS-CoV-2 viral copies this assay can detect is 138 copies/mL. A negative result does not preclude SARS-Cov-2 infection and should not be used as the sole basis for treatment or other patient management decisions. A negative result may occur with  improper specimen collection/handling, submission of specimen other than nasopharyngeal swab, presence of viral mutation(s) within the areas targeted by this assay, and inadequate number of viral copies(<138 copies/mL). A negative result must be combined with clinical observations, patient history, and epidemiological information. The expected result is Negative.  Fact Sheet for Patients:  EntrepreneurPulse.com.au  Fact Sheet for Healthcare Providers:  IncredibleEmployment.be  This test is no t yet approved or cleared by the [place] FDA and  has been authorized for detection and/or diagnosis of SARS-CoV-2 by FDA under an Emergency Use Authorization (EUA). This EUA will remain  in effect (meaning this test can be used) for the duration of the COVID-19 declaration under Section 564(b)(1) of the Act, 21 U.S.C.section 360bbb-3(b)(1), unless the authorization is terminated  or revoked sooner.       Influenza A by PCR NEGATIVE NEGATIVE Final   Influenza B by PCR NEGATIVE NEGATIVE Final    Comment: (NOTE) The Xpert Xpress SARS-CoV-2/FLU/RSV plus assay is intended as an aid in the diagnosis of influenza from Nasopharyngeal swab specimens and should not be used as a sole basis for treatment. Nasal washings and aspirates are unacceptable for  Xpert Xpress SARS-CoV-2/FLU/RSV testing.  Fact Sheet for Patients: EntrepreneurPulse.com.au  Fact Sheet for Healthcare Providers: IncredibleEmployment.be  This test is not yet approved or cleared by the [place] FDA and has been authorized for detection and/or diagnosis of SARS-CoV-2 by FDA under an Emergency Use Authorization (EUA). This EUA will remain in effect (meaning this test can be used) for the duration of the COVID-19 declaration under Section 564(b)(1) of the Act, 21 U.S.C. section 360bbb-3(b)(1), unless the authorization is terminated or revoked.  Performed at [facility], [place] [address], [place], [place] [address]   Urine Culture     Status:  None (Preliminary result)   Collection Time: 10/22/20 11:00 PM   Specimen: In/Out Cath Urine  Result Value Ref Range Status   Specimen Description   Final    IN/OUT CATH URINE Performed at [place] [address], [place], [place] [address]    Special Requests   Final    NONE Performed at [facility], [place] [address] [place], [place] [address]    Culture   Final    CULTURE REINCUBATED FOR BETTER GROWTH Performed at [facility], [place] [address] [place], [place] [address]    Report Status PENDING  Incomplete  MRSA Next Gen by PCR, Nasal     Status: None   Collection Time: 10/23/20  1:34 AM   Specimen: Nasal Mucosa; Nasal Swab  Result Value Ref Range Status   MRSA by PCR Next Gen NOT DETECTED NOT DETECTED Final    Comment: (NOTE) The GeneXpert MRSA Assay (FDA approved for NASAL specimens only), is one component of a comprehensive MRSA colonization surveillance program. It is not intended to diagnose MRSA infection nor to guide or monitor treatment for MRSA infections. Test performance is not FDA approved in patients less than 43 years old. Performed at [facility], [place] [address], [place],  [address]     Studies/Results: DG Chest 2 View  Result Date: 10/22/2020 CLINICAL DATA:  Right ureteral stent. Sudden onset of increased flank pain today. EXAM: CHEST - 2 VIEW COMPARISON:  CT 04/25/2020 FINDINGS: Right PICC line with tip over the low SVC region. Normal heart size and pulmonary vascularity. No focal airspace disease or consolidation in the lungs. No blunting of costophrenic angles. No pneumothorax. Mediastinal contours appear intact. Mild thoracic scoliosis convex towards the right. IMPRESSION: No active cardiopulmonary disease. Electronically Signed   By: Lucienne Capers M.D.   On: 10/22/2020 20:45   DG C-Arm 1-60 Min-No Report  Result Date: 10/23/2020 Fluoroscopy was utilized by the requesting physician.  No radiographic interpretation.   CT Renal Stone Study  Result Date: 10/22/2020 CLINICAL DATA:  Flank pain. Kidney stones suspected. Right flank pain, nausea, and vomiting. EXAM: CT ABDOMEN AND PELVIS WITHOUT CONTRAST TECHNIQUE: Multidetector CT imaging of the abdomen and pelvis was performed following the standard protocol without IV contrast. COMPARISON:  08/10/2020 FINDINGS: Lower chest: Lung bases are clear. Hepatobiliary: No focal liver lesions. Gallbladder is contracted with diffusely increased density. This could represent multiple tiny stones, sludge, or milk of calcium. No bile duct dilatation. No inflammatory changes. Pancreas: Unremarkable. No pancreatic ductal dilatation or surrounding inflammatory changes. Spleen: Normal in size without focal abnormality. Adrenals/Urinary Tract: No adrenal gland nodules. Several stones are demonstrated in both kidneys. Largest is on the left, measuring 10 mm diameter. There is right-sided hydronephrosis with increased density in the urine in the renal pelvis and ureter. This suggests probable hemorrhage. No residual ureteral stone is demonstrated. There is a catheter coiled in the bladder likely representing a  previous ureteral stent which has migrated. No bladder wall thickening. No bladder stones identified. Stomach/Bowel: Stomach, small bowel, and colon are not abnormally distended. No wall thickening or inflammatory changes are seen. 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 Scattered stool throughout the colon. Appendix is normal. Vascular/Lymphatic: No significant vascular findings are present. No enlarged abdominal or pelvic lymph nodes. Reproductive: Uterus and bilateral adnexa are unremarkable. Other: No free air or free fluid in the abdomen. Abdominal wall musculature appears intact. 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 IMPRESSION: 1. Multiple bilateral nonobstructing intrarenal stones. 2. Dilated right renal pelvis, calices, and ureter with wall thickening and increased intraluminal density likely representing hemorrhage. 3. A double pigtail catheter is coiled in the bladder likely representing a previous ureteral catheter that has migrated. Electronically Signed   By: Lucienne Capers M.D.   On: 10/22/2020 20:51    Assessment/Plan: Right hydronephrosis with blot clot: -DC Foley catheter today -Patient has follow-up with [facility] urology scheduled for next week that she plans on keeping -Emphasized that she has indwelling right ureteral stent that can not stay there indefinitely -Markers of infection and sepsis have significantly improved  Please contact urology with further questions   Jacalyn Lefevre, MD Urology 10/24/2020, 9:48 AM

## 2020-10-23 NOTE — Assessment & Plan Note (Signed)
-  Replete and recheck as needed 

## 2020-10-23 NOTE — Progress Notes (Signed)
Pharmacy Antibiotic Note  April [name] is a 45 y.o. female admitted on 10/22/2020 with UTI with sepsis.  Pharmacy has been consulted for Cefepime dosing.  Plan: Cefepime 2gm IV q8h  Height: 5\' 7"  (170.2 cm) Weight: 71.8 kg (158 lb 3.2 oz) IBW/kg (Calculated) : 61.6  Temp (24hrs), Avg:98.8 F (37.1 C), Min:98.8 F (37.1 C), Max:98.8 F (37.1 C)  Recent Labs  Lab 10/17/20 1027 10/22/20 2022  WBC 16.0* 75.5*  CREATININE 0.55 0.92  LATICACIDVEN  --  3.2*    Estimated Creatinine Clearance: 74.3 mL/min (by C-G formula based on SCr of 0.92 mg/dL).    Allergies  Allergen Reactions   Lisinopril Swelling    Lip swelling   Irinotecan Other (See Comments)    Stomach cramping Patient given 0.5 mL atropine IV and was able to continue infusion.  See Progress note on 10/03/20.    Leucovorin Calcium Other (See Comments)    Stomach cramping Patient given 0.5 mL atropine IV and was able to continue infusion. See Progress note on 10/03/20.     Antimicrobials this admission: 10/10 Vanc x 1 10/10 Flagyl x 1 10/10 Cefepime >>  Dose adjustments this admission:    Microbiology results: 10/10 BCx:   10/10 UCx:      Thank you for allowing pharmacy to be a part of this patient's care.  Everette Rank, PharmD 10/23/2020 12:19 AM

## 2020-10-23 NOTE — Op Note (Signed)
Preoperative diagnosis:  Right hydronephrosis Malpositioned right ureteral stent Sepsis secondary to urinary tract infection   Postoperative diagnosis:   Right hydronephrosis Malpositioned right ureteral stent Sepsis secondary to urinary tract infection   Procedure:  Cystoscopy right ureteral stent placement right retrograde pyelography with interpretation  Removal of existing malpositioned right ureteral stent  Surgeon: Jacalyn Lefevre, MD  Anesthesia: General  Complications: None  Intraoperative findings:  Normal anterior urethra  Right retrograde pyelography with hydronephrosis to bladder and difficulty passing contrast to collecting system Malpositioned right ureteral stent seen coiled in bladder 6Fr x 26 cm right JJ stent no tether Bladder mucosa difficult to visualize secondary to hematuria  EBL: Minimal  Specimens: None  Indication: April [name] is a 45 y.o. patient with recent right percutaneous nephrolithotomy by Dr. Amalia Hailey at [facility] urology who presented to the ED with gross hematuria, nausea, vomiting and clinical signs of sepsis.  CT scan showed right ureteral stent had migrated into the bladder and patient had evidence of right hydronephrosis with blood product in renal pelvis and ureter.  After reviewing the management options for treatment, he elected to proceed with the above surgical procedure(s). We have discussed the potential benefits and risks of the procedure, side effects of the proposed treatment, the likelihood of the patient achieving the goals of the procedure, and any potential problems that might occur during the procedure or recuperation. Informed consent has been obtained.  Description of procedure:  The patient was taken to the operating room and general anesthesia was induced.  The patient was placed in the dorsal lithotomy position, prepped and draped in the usual sterile fashion, and preoperative antibiotics were administered. A  preoperative time-out was performed.   Cystourethroscopy was performed.  The patient's urethra was examined and was normal.  Visualization of the bladder was poor secondary to gross hematuria and clot.  The existing ureteral stent was seen coiled in the bladder and removed with the graspers.  Attention then turned to the right ureteral orifice and a ureteral catheter was used to intubate the ureteral orifice.  Omnipaque contrast was injected through the ureteral catheter and a retrograde pyelogram was performed with findings as dictated above.  A 0.38 sensor guidewire was then advanced up the right ureter into the renal pelvis under fluoroscopic guidance.  The wire was then backloaded through the cystoscope and a ureteral stent was advance over the wire using Seldinger technique.  The stent was positioned appropriately under fluoroscopic and cystoscopic guidance.  The wire was then removed with an adequate stent curl noted in the renal pelvis as well as in the bladder.  The bladder was then emptied and the procedure ended.  The patient appeared to tolerate the procedure well and without complications.  The patient was able to be awakened and transferred to the recovery unit in satisfactory condition.   Plan: The patient will be admitted to stepdown unit under the hospitalist service.  She will be treated for sepsis with fluid resuscitation and antibiotics.  We will continue to follow.  22Fr 3-way foley placed with inflow port capped in the event she needs continuous bladder irrigation.  Will keep foley to gravity drainage for now.    Jacalyn Lefevre, M.D.

## 2020-10-23 NOTE — Hospital Course (Signed)
[name] is a 45 y.o. female with PMH Stage IV colon cancer on chemotherapy, HTN, nephrolithiasis (followed by urology at [facility]) who presented with flank pain and gross hematuria. Patient reports that she has a right ureteral stent and had a right percutaneous nephrostomy tube that was removed on 10/12/2020.  She did underwent chemotherapy on 10/17/2020 and was treated with pegfilgrastrim on 10/19/2020.  She has been experiencing fatigue, nausea, and some vomiting which she attributed to the chemotherapy but then developed acute onset of severe right flank pain and gross hematuria the afternoon of admission.    Upon arrival to the ED, patient is found to be afebrile, saturating mid to upper 90s on room air, tachycardic to 150s, and with stable blood pressure. CT stone study notable for multiple bilateral nonobstructing intrarenal stones and concerning for dilated right renal pelvis, calyces, and ureter with wall thickening likely representing hemorrhage.  Also noted on the CT is a double-pigtail catheter coiled within the bladder.   Cultures were collected in the ER and patient was started on vancomycin, cefepime, Flagyl.  Urology was consulted and patient underwent cystoscopy with malpositioned stent removal and replaced with right ureteral stent.

## 2020-10-23 NOTE — Assessment & Plan Note (Signed)
-   considered due to displaced R ureteral stent; now s/p stent removal and replacement

## 2020-10-23 NOTE — Transfer of Care (Signed)
Immediate Anesthesia Transfer of Care Note  Patient: April [name]  Procedure(s) Performed: CYSTOSCOPY WITH RETROGRADE PYELOGRAM, URETEROSCOPY AND STENT PLACEMENT (Right: Ureter)  Patient Location: PACU  Anesthesia Type:General  Level of Consciousness: awake, alert , oriented and patient cooperative  Airway & Oxygen Therapy: Patient Spontanous Breathing and Patient connected to face mask oxygen  Post-op Assessment: Report given to RN and Post -op Vital signs reviewed and stable  Post vital signs: Reviewed and stable  Last Vitals:  Vitals Value Taken Time  BP 156/123 10/23/20 0030  Temp    Pulse 113 10/23/20 0032  Resp 20 10/23/20 0032  SpO2 100 % 10/23/20 0032  Vitals shown include unvalidated device data.  Last Pain:  Vitals:   10/22/20 2226  TempSrc:   PainSc: 6          Complications: No notable events documented.

## 2020-10-23 NOTE — Progress Notes (Signed)
Notified bedside nurse of need to draw repeat lactic acid. Patient back from OR

## 2020-10-23 NOTE — Assessment & Plan Note (Addendum)
-   Recent right ureteral stent noted to be malpositioned on imaging on admission - Underwent cystoscopy with removal of malpositioned right ureteral stent and new stent placed -Urine culture growing Corynebacterium, 10,000 colonies.  Discharging on Bactrim to complete course -Hematuria improved and patient states that urine was clear yellow after Foley removed -She has follow-up with [facility] within a week

## 2020-10-23 NOTE — H&P (Signed)
Follow History and Physical    April [name] EQA:[id number] DOB: 19-May-1975 DOA: 10/22/2020  PCP: Ruben Im, PA-C   Patient coming from: Home   Chief Complaint: Right flank pain, bloody urine   HPI: April [name] is a very pleasant 45 y.o. 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  Patient reports that she has a right ureteral stent and had a right percutaneous nephrostomy tube that was removed on 10/12/2020.  She did underwent chemotherapy on 10/17/2020 and was treated with pegfilgrastrim on 10/19/2020.  She has been experiencing fatigue, nausea, and some vomiting which she attributed to the chemotherapy but then developed acute onset of severe right flank pain and gross hematuria this afternoon.  Symptoms persisted and she sought evaluation in the emergency department.  She denies cough, shortness of breath, or chest pain.  Site of right percutaneous nephrostomy is said to be healing well.  ED Course: Upon arrival to the ED, patient is found to be afebrile, saturating mid to upper 90s on room air, tachycardic to 150s, and with stable blood pressure.  EKG features sinus tachycardia with LVH, anterolateral T wave abnormality, and QTc 604 ms.  Chest x-ray negative for acute cardiopulmonary disease.  CT stone study notable for multiple bilateral nonobstructing intrarenal stones and concerning for dilated right renal pelvis, calyces, and ureter with wall thickening likely representing hemorrhage.  Also noted on the CT is a double-pigtail catheter coiled within the bladder.  Blood and urine cultures were collected in the emergency department and the patient was treated with a liter of LR, morphine, vancomycin, cefepime, and Flagyl.  Urology was consulted by the ED physician and hospitalists asked  to admit.  Review of Systems:  All other systems reviewed and apart from HPI, are negative.  Past Medical History:  Diagnosis Date   Hypertension    Renal calculi    bilateral    Past Surgical History:  Procedure Laterality Date   CESAREAN SECTION     x2   IR RADIOLOGIST EVAL & MGMT  09/16/2019   IR RADIOLOGIST EVAL & MGMT  09/23/2019   IR RADIOLOGIST EVAL & MGMT  09/20/2019   IR RADIOLOGIST EVAL & MGMT  09/30/2019   IR REMOVAL TUN ACCESS W/ PORT W/O FL MOD SED  09/14/2019   URETERAL STENT PLACEMENT      Social History:   reports that she has been smoking cigarettes. She has been smoking an average of .5 packs per day. She has never used smokeless tobacco. She reports current alcohol use. She reports that she does not currently use drugs.  Allergies  Allergen Reactions   Lisinopril Swelling    Lip swelling   Irinotecan Other (See Comments)    Stomach cramping Patient given 0.5 mL atropine IV and was able to continue infusion.  See Progress note on 10/03/20.    Leucovorin Calcium Other (See Comments)    Stomach cramping Patient given 0.5 mL atropine IV and was able to continue infusion. See Progress note on 10/03/20.     Family History  Problem Relation Age of Onset   Hypertension Mother    Diabetes Mellitus II Father    Stroke Father    Ulcers Sister      Prior to Admission medications   Medication Sig Start Date End Date Taking? Authorizing Provider  acetaminophen (TYLENOL) 325 MG  tablet Take 650 mg by mouth every 6 (six) hours as needed for moderate pain or headache.    [provider]  amLODipine (NORVASC) 10 MG tablet Take 1 tablet (10 mg total) by mouth daily. 09/09/19   Marrian Salvage, FNP  dexamethasone (DECADRON) 4 MG tablet Take 1 tablet (4 mg total) by mouth 2 (two) times daily with a meal. Start on day 2 of treatment and take for 3 days with each cycle. 10/03/20   Owens Shark, NP  diphenoxylate-atropine (LOMOTIL) 2.5-0.025 MG tablet Take 1  tablet by mouth 4 (four) times daily as needed for diarrhea or loose stools. 10/03/20   Owens Shark, NP  doxycycline (VIBRA-TABS) 100 MG tablet Take 1 tablet (100 mg total) by mouth 2 (two) times daily. Begin day before first treatment 09/06/20   Owens Shark, NP  hydrALAZINE (APRESOLINE) 10 MG tablet TAKE 1 TABLET(10 MG) BY MOUTH THREE TIMES DAILY 10/25/19   Ladell Pier, MD  hydrochlorothiazide (HYDRODIURIL) 25 MG tablet TAKE 1 TABLET BY MOUTH EVERY DAY 02/07/20   Marrian Salvage, FNP  HYDROmorphone (DILAUDID) 2 MG tablet Take 1 tablet (2 mg total) by mouth every 12 (twelve) hours as needed for severe pain. 08/23/20   Owens Shark, NP  ibuprofen (ADVIL) 800 MG tablet Take 1 tablet (800 mg total) by mouth daily as needed for moderate pain. 10/02/20   Ladell Pier, MD  metoprolol tartrate (LOPRESSOR) 50 MG tablet TAKE 1 TABLET(50 MG) BY MOUTH TWICE DAILY 10/16/20   Marrian Salvage, FNP  ondansetron (ZOFRAN) 4 MG tablet Take 1 tablet (4 mg total) by mouth every 8 (eight) hours as needed for nausea or vomiting. Do not take for 3 days after chemo 10/03/20   Owens Shark, NP  oxybutynin (DITROPAN-XL) 10 MG 24 hr tablet Take 10 mg by mouth daily. 06/26/20   [provider]  potassium chloride (KLOR-CON) 10 MEQ tablet Take 1 tablet (10 mEq total) by mouth daily. 03/01/20   Ladell Pier, MD  prochlorperazine (COMPAZINE) 10 MG tablet Take 1 tablet (10 mg total) by mouth every 6 (six) hours as needed for nausea or vomiting. 09/21/20   Owens Shark, NP  sorbitol 70 % solution Take 15 mLs by mouth 2 (two) times daily. Patient not taking: No sig reported 08/23/20   Ladell Pier, MD  tiZANidine (ZANAFLEX) 4 MG tablet TAKE 1/2 TO 1 TABLET(2 TO 4 MG) BY MOUTH EVERY 8 HOURS AS NEEDED FOR MUSCLE SPASMS 08/21/20   Gregor Hams, MD    Physical Exam: Vitals:   10/22/20 2115 10/22/20 2130 10/22/20 2200 10/22/20 2230  BP: (!) 148/129 (!) 148/132 (!) 143/127 (!) 138/109  Pulse: (!)  154 (!) 142 (!) 135 (!) 137  Resp:  14 (!) 23 (!) 21  Temp:      TempSrc:      SpO2: 99% 100% 97% 100%  Weight:      Height:        Constitutional: NAD, calm  Eyes: PERTLA, lids and conjunctivae normal ENMT: Mucous membranes are moist. Posterior pharynx clear of any exudate or lesions.   Neck: supple, no masses  Respiratory:  no wheezing, no crackles. No accessory muscle use.  Cardiovascular: Rate ~120 and regular. No extremity edema.   Abdomen: No distension, soft, suprapubic tenderness, no rebound pain or guarding. Bowel sounds active.  Musculoskeletal: no clubbing / cyanosis. No joint deformity upper and lower extremities.   Skin: no significant  rashes, lesions, ulcers. Warm, dry, well-perfused. Neurologic: CN 2-12 grossly intact. Moving all extremities. Alert and oriented.  Psychiatric: Very pleasant. Cooperative.    Labs and Imaging on Admission: I have personally reviewed following labs and imaging studies  CBC: Recent Labs  Lab 10/17/20 1027 10/22/20 2022  WBC 16.0* 75.5*  NEUTROABS 10.8* 65.7*  HGB 11.4* 14.6  HCT 34.6* 43.5  MCV 94.8 95.4  PLT 323 765   Basic Metabolic Panel: Recent Labs  Lab 10/17/20 1027 10/22/20 2022 10/22/20 2104  NA 138 134*  --   K 3.8 3.2*  --   CL 103 97*  --   CO2 29 25  --   GLUCOSE 105* 186*  --   BUN 8 20  --   CREATININE 0.55 0.92  --   CALCIUM 9.5 9.6  --   MG 2.0  --  2.1   GFR: Estimated Creatinine Clearance: 74.3 mL/min (by C-G formula based on SCr of 0.92 mg/dL). Liver Function Tests: Recent Labs  Lab 10/17/20 1027 10/22/20 2022  AST 12* 20  ALT 9 17  ALKPHOS 137* 257*  BILITOT 0.2* 0.7  PROT 6.7 8.3*  ALBUMIN 3.9 4.7   No results for input(s): LIPASE, AMYLASE in the last 168 hours. No results for input(s): AMMONIA in the last 168 hours. Coagulation Profile: Recent Labs  Lab 10/22/20 2022  INR 1.0   Cardiac Enzymes: No results for input(s): CKTOTAL, CKMB, CKMBINDEX, TROPONINI in the last 168  hours. BNP (last 3 results) No results for input(s): PROBNP in the last 8760 hours. HbA1C: No results for input(s): HGBA1C in the last 72 hours. CBG: No results for input(s): GLUCAP in the last 168 hours. Lipid Profile: No results for input(s): CHOL, HDL, LDLCALC, TRIG, CHOLHDL, LDLDIRECT in the last 72 hours. Thyroid Function Tests: No results for input(s): TSH, T4TOTAL, FREET4, T3FREE, THYROIDAB in the last 72 hours. Anemia Panel: No results for input(s): VITAMINB12, FOLATE, FERRITIN, TIBC, IRON, RETICCTPCT in the last 72 hours. Urine analysis:    Component Value Date/Time   COLORURINE RED (A) 10/22/2020 2300   APPEARANCEUR TURBID (A) 10/22/2020 2300   LABSPEC  10/22/2020 2300    TEST NOT REPORTED DUE TO COLOR INTERFERENCE OF URINE PIGMENT   PHURINE  10/22/2020 2300    TEST NOT REPORTED DUE TO COLOR INTERFERENCE OF URINE PIGMENT   GLUCOSEU (A) 10/22/2020 2300    TEST NOT REPORTED DUE TO COLOR INTERFERENCE OF URINE PIGMENT   HGBUR (A) 10/22/2020 2300    TEST NOT REPORTED DUE TO COLOR INTERFERENCE OF URINE PIGMENT   BILIRUBINUR (A) 10/22/2020 2300    TEST NOT REPORTED DUE TO COLOR INTERFERENCE OF URINE PIGMENT   KETONESUR (A) 10/22/2020 2300    TEST NOT REPORTED DUE TO COLOR INTERFERENCE OF URINE PIGMENT   PROTEINUR (A) 10/22/2020 2300    TEST NOT REPORTED DUE TO COLOR INTERFERENCE OF URINE PIGMENT   NITRITE (A) 10/22/2020 2300    TEST NOT REPORTED DUE TO COLOR INTERFERENCE OF URINE PIGMENT   LEUKOCYTESUR (A) 10/22/2020 2300    TEST NOT REPORTED DUE TO COLOR INTERFERENCE OF URINE PIGMENT   Sepsis Labs: @LABRCNTIP (procalcitonin:4,lacticidven:4) ) Recent Results (from the past 240 hour(s))  Resp Panel by RT-PCR (Flu A&B, Covid) Nasopharyngeal Swab     Status: None   Collection Time: 10/22/20  9:25 PM   Specimen: Nasopharyngeal Swab; Nasopharyngeal(NP) swabs in vial transport medium  Result Value Ref Range Status   SARS Coronavirus 2 by RT PCR NEGATIVE NEGATIVE Final  Comment: (NOTE) SARS-CoV-2 target nucleic acids are NOT DETECTED.  The SARS-CoV-2 RNA is generally detectable in upper respiratory specimens during the acute phase of infection. The lowest concentration of SARS-CoV-2 viral copies this assay can detect is 138 copies/mL. A negative result does not preclude SARS-Cov-2 infection and should not be used as the sole basis for treatment or other patient management decisions. A negative result may occur with  improper specimen collection/handling, submission of specimen other than nasopharyngeal swab, presence of viral mutation(s) within the areas targeted by this assay, and inadequate number of viral copies(<138 copies/mL). A negative result must be combined with clinical observations, patient history, and epidemiological information. The expected result is Negative.  Fact Sheet for Patients:  EntrepreneurPulse.com.au  Fact Sheet for Healthcare Providers:  IncredibleEmployment.be  This test is no t yet approved or cleared by the [place] FDA and  has been authorized for detection and/or diagnosis of SARS-CoV-2 by FDA under an Emergency Use Authorization (EUA). This EUA will remain  in effect (meaning this test can be used) for the duration of the COVID-19 declaration under Section 564(b)(1) of the Act, 21 U.S.C.section 360bbb-3(b)(1), unless the authorization is terminated  or revoked sooner.       Influenza A by PCR NEGATIVE NEGATIVE Final   Influenza B by PCR NEGATIVE NEGATIVE Final    Comment: (NOTE) The Xpert Xpress SARS-CoV-2/FLU/RSV plus assay is intended as an aid in the diagnosis of influenza from Nasopharyngeal swab specimens and should not be used as a sole basis for treatment. Nasal washings and aspirates are unacceptable for Xpert Xpress SARS-CoV-2/FLU/RSV testing.  Fact Sheet for Patients: EntrepreneurPulse.com.au  Fact Sheet for Healthcare  Providers: IncredibleEmployment.be  This test is not yet approved or cleared by the [place] FDA and has been authorized for detection and/or diagnosis of SARS-CoV-2 by FDA under an Emergency Use Authorization (EUA). This EUA will remain in effect (meaning this test can be used) for the duration of the COVID-19 declaration under Section 564(b)(1) of the Act, 21 U.S.C. section 360bbb-3(b)(1), unless the authorization is terminated or revoked.  Performed at [facility], [place] [address], [place], [place] [address]      Radiological Exams on Admission: DG Chest 2 View  Result Date: 10/22/2020 CLINICAL DATA:  Right ureteral stent. Sudden onset of increased flank pain today. EXAM: CHEST - 2 VIEW COMPARISON:  CT 04/25/2020 FINDINGS: Right PICC line with tip over the low SVC region. Normal heart size and pulmonary vascularity. No focal airspace disease or consolidation in the lungs. No blunting of costophrenic angles. No pneumothorax. Mediastinal contours appear intact. Mild thoracic scoliosis convex towards the right. IMPRESSION: No active cardiopulmonary disease. Electronically Signed   By: Lucienne Capers M.D.   On: 10/22/2020 20:45   CT Renal Stone Study  Result Date: 10/22/2020 CLINICAL DATA:  Flank pain. Kidney stones suspected. Right flank pain, nausea, and vomiting. EXAM: CT ABDOMEN AND PELVIS WITHOUT CONTRAST TECHNIQUE: Multidetector CT imaging of the abdomen and pelvis was performed following the standard protocol without IV contrast. COMPARISON:  08/10/2020 FINDINGS: Lower chest: Lung bases are clear. Hepatobiliary: No focal liver lesions. Gallbladder is contracted with diffusely increased density. This could represent multiple tiny stones, sludge, or milk of calcium. No bile duct dilatation. No inflammatory changes. Pancreas: Unremarkable. No pancreatic ductal dilatation or surrounding inflammatory changes. Spleen: Normal in size without focal  abnormality. Adrenals/Urinary Tract: No adrenal gland nodules. Several stones are demonstrated in both kidneys. Largest is on the left, measuring 10 mm  diameter. There is right-sided hydronephrosis with increased density in the urine in the renal pelvis and ureter. This suggests probable hemorrhage. No residual ureteral stone is demonstrated. There is a catheter coiled in the bladder likely representing a previous ureteral stent which has migrated. No bladder wall thickening. No bladder stones identified. Stomach/Bowel: Stomach, small bowel, and colon are not abnormally distended. No wall thickening or inflammatory changes are seen. 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 Scattered stool throughout the colon. Appendix is normal. Vascular/Lymphatic: No significant vascular findings are present. No enlarged abdominal or pelvic lymph nodes. Reproductive: Uterus and bilateral adnexa are unremarkable. Other: No free air or free fluid in the abdomen. Abdominal wall musculature appears intact. 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 IMPRESSION: 1. Multiple bilateral nonobstructing intrarenal stones. 2. Dilated right renal pelvis, calices, and ureter with wall thickening and increased intraluminal density likely representing hemorrhage. 3. A double pigtail catheter is coiled in the bladder likely representing a previous ureteral catheter that has migrated. Electronically Signed   By: Lucienne Capers M.D.   On: 10/22/2020 20:51    EKG: Independently reviewed. Sinus tachycardia, rate 143, anterolateral T-wave abnormality, QTc 604 ms.   Assessment/Plan   1. Right hydronephrosis; migrated Rt ureteral stent; ?urosepsis  - Pt followed by urology at [facility] for nephrolithiasis had Rt percutaneous nephrostomy removed 9/30 and right ureteral stent placed p/w acute right flank pain and gross hematuria  - She was afebrile in ED with HR up to 150, stable BP, WBC 75.5k  (received pegfilgastrim 10/7), and lactate 3.2  - CT concerning for right hydroureteronephrosis, likely blood products in collecting system and ureter, and ureteral stent coiled in bladder  - Urology consulting and much appreciated, taking pt to OR now  - Blood and urine cultures collected in ED and broad-spectrum antibiotics started  - Complete 30 cc/kg fluid bolus, continue broad-spectrum antibiotics, trend lactate, follow cultures and clinical course    2. Colon cancer  - Stage IV colon cancer undergoing treatment with FOLFIRI/Panitumumab  - Last chemo was 10/5, given pegfilgrastrim 10/7  - Continue oncology follow-up on discharge    3. Hypertension  - Scheduled antihypertensives held initially given concern for early sepsis    4. Hypokalemia  - Replace, monitor   5. Prolonged QT interval  - QTc 604 ms in ED  - Replace potassium, minimize QT-prolonging medications, continue cardiac monitoring, repeat EKG in am   6. Renal calculi  - Continue follow-up with urology at [facility] after discharge    DVT prophylaxis: SCDs  Code Status: Full  Level of Care: Level of care: Stepdown Family Communication: None present  Disposition Plan:  Patient is from: Home  Anticipated d/c is to: Home  Anticipated d/c date is: 10/25/20 Patient currently: Pending urology consultation, improvement in sepsis parameters  Consults called: urology  Admission status: Inpatient     Vianne Bulls, MD [facility]  10/23/2020, 12:11 AM

## 2020-10-23 NOTE — Assessment & Plan Note (Signed)
-   Continue telemetry - replacing electrolytes PRN

## 2020-10-23 NOTE — Assessment & Plan Note (Signed)
-   see ureteral stent displacement

## 2020-10-23 NOTE — Assessment & Plan Note (Signed)
-   undergoing active chemo outpatient; follows with Dr. Benay Spice

## 2020-10-23 NOTE — Assessment & Plan Note (Signed)
-   Resume amlodipine, hydralazine, Lopressor

## 2020-10-23 NOTE — Plan of Care (Signed)

## 2020-10-23 NOTE — Assessment & Plan Note (Addendum)
-    tachycardia, leukocytosis; UA unable to be interpreted but presumed urinary source due to obstructed urinary tract and displaced ureteral stent -CT shows ureteral stent displacement.  Discharged with Bactrim to complete course. 10k colonies of diphtheroids (Corynebacterium species)

## 2020-10-24 DIAGNOSIS — N1330 Unspecified hydronephrosis: Secondary | ICD-10-CM | POA: Diagnosis not present

## 2020-10-24 DIAGNOSIS — T83122A Displacement of urinary stent, initial encounter: Secondary | ICD-10-CM | POA: Diagnosis not present

## 2020-10-24 DIAGNOSIS — R319 Hematuria, unspecified: Secondary | ICD-10-CM | POA: Diagnosis not present

## 2020-10-24 LAB — URINE CULTURE: Culture: 10000 — AB

## 2020-10-24 LAB — CBC
HCT: 29.1 % — ABNORMAL LOW (ref 36.0–46.0)
Hemoglobin: 9.7 g/dL — ABNORMAL LOW (ref 12.0–15.0)
MCH: 32.2 pg (ref 26.0–34.0)
MCHC: 33.3 g/dL (ref 30.0–36.0)
MCV: 96.7 fL (ref 80.0–100.0)
Platelets: 221 10*3/uL (ref 150–400)
RBC: 3.01 MIL/uL — ABNORMAL LOW (ref 3.87–5.11)
RDW: 13.5 % (ref 11.5–15.5)
WBC: 18.8 10*3/uL — ABNORMAL HIGH (ref 4.0–10.5)
nRBC: 0 % (ref 0.0–0.2)

## 2020-10-24 LAB — BASIC METABOLIC PANEL
Anion gap: 4 — ABNORMAL LOW (ref 5–15)
BUN: 12 mg/dL (ref 6–20)
CO2: 30 mmol/L (ref 22–32)
Calcium: 8.1 mg/dL — ABNORMAL LOW (ref 8.9–10.3)
Chloride: 103 mmol/L (ref 98–111)
Creatinine, Ser: 0.49 mg/dL (ref 0.44–1.00)
GFR, Estimated: >60 mL/min (ref >60)
Glucose, Bld: 94 mg/dL (ref 70–99)
Potassium: 3.2 mmol/L — ABNORMAL LOW (ref 3.5–5.1)
Sodium: 137 mmol/L (ref 135–145)

## 2020-10-24 LAB — MAGNESIUM: Magnesium: 1.8 mg/dL (ref 1.7–2.4)

## 2020-10-24 MED ORDER — POTASSIUM CHLORIDE CRYS ER 20 MEQ PO TBCR
40.0000 meq | EXTENDED_RELEASE_TABLET | Freq: Once | ORAL | Status: AC
  Administered 2020-10-24: 40 meq via ORAL
  Filled 2020-10-24: qty 2

## 2020-10-24 MED ORDER — IBUPROFEN 200 MG PO TABS: 400.0000 mg | ORAL_TABLET | Freq: Four times a day (QID) | ORAL | Status: DC | PRN

## 2020-10-24 NOTE — Plan of Care (Signed)
  Problem: Clinical Measurements: Goal: Will remain free from infection Outcome: Progressing Goal: Diagnostic test results will improve Outcome: Progressing Goal: Respiratory complications will improve Outcome: Progressing Goal: Cardiovascular complication will be avoided Outcome: Progressing   Problem: Activity: Goal: Risk for activity intolerance will decrease Outcome: Progressing   

## 2020-10-24 NOTE — Progress Notes (Signed)
Report given to Iva Lento, RN over phone.

## 2020-10-24 NOTE — Progress Notes (Signed)
Progress Note    April [name]   PIR:[id number]  DOB: 08/04/1975  DOA: 10/22/2020     2 Date of Service: 10/24/2020   [name] is a 45 y.o. female with PMH Stage IV colon cancer on chemotherapy, HTN, nephrolithiasis (followed by urology at [facility]) who presented with flank pain and gross hematuria. Patient reports that she has a right ureteral stent and had a right percutaneous nephrostomy tube that was removed on 10/12/2020.  She did underwent chemotherapy on 10/17/2020 and was treated with pegfilgrastrim on 10/19/2020.  She has been experiencing fatigue, nausea, and some vomiting which she attributed to the chemotherapy but then developed acute onset of severe right flank pain and gross hematuria the afternoon of admission.    Upon arrival to the ED, patient is found to be afebrile, saturating mid to upper 90s on room air, tachycardic to 150s, and with stable blood pressure. CT stone study notable for multiple bilateral nonobstructing intrarenal stones and concerning for dilated right renal pelvis, calyces, and ureter with wall thickening likely representing hemorrhage.  Also noted on the CT is a double-pigtail catheter coiled within the bladder.   Cultures were collected in the ER and patient was started on vancomycin, cefepime, Flagyl.  Urology was consulted and patient underwent cystoscopy with malpositioned stent removal and replaced with right ureteral stent.    Subjective:  No events overnight.  States that ibuprofen helped her pain yesterday.  Tolerating diet well.  Hospital Problems * Hydronephrosis of right kidney - considered due to displaced R ureteral stent; now s/p stent removal and replacement   Hematuria - Hemoglobin downtrending, suspect should be stabilizing soon - Continue trending 1 more night and repeat CBC in a.m. - Foley catheter being removed today  SIRS (systemic inflammatory response syndrome) (HCC) - Afebrile, tachycardia, leukocytosis; UA unable  to be interpreted - on empiric abx for now; watch cultures   Ureteral stent displacement ([place]) - Recent right ureteral stent noted to be malpositioned on imaging on admission - Underwent cystoscopy with removal of malpositioned right ureteral stent and new stent placed -Continue antibiotics and follow-up cultures -Hematuria improving today.  Okay for Foley removal per urology  Malignant neoplasm of rectosigmoid (colon) ([place]) - undergoing active chemo outpatient; follows with Dr. Benay Spice   Renal calculi - see ureteral stent displacement   Prolonged QT interval - Continue telemetry - replacing electrolytes PRN  Hypokalemia - Replete and recheck as needed  Hypertension - Resume amlodipine, hydralazine, Lopressor    Objective Vital signs were reviewed and unremarkable.  Vitals:   10/24/20 0900 10/24/20 1000 10/24/20 1100 10/24/20 1200  BP: 124/86 (!) 138/100 (!) 113/91 (!) 123/94  Pulse:    83  Resp: 17 15 18  (!) 22  Temp:    98.6 F (37 C)  TempSrc:    Oral  SpO2: 100%   100%  Weight:      Height:       71.8 kg  Exam Physical Exam Constitutional:      General: She is not in acute distress.    Appearance: Normal appearance.  HENT:     Head: Normocephalic and atraumatic.     Mouth/Throat:     Mouth: Mucous membranes are moist.  Eyes:     Extraocular Movements: Extraocular movements intact.  Cardiovascular:     Rate and Rhythm: Normal rate and regular rhythm.  Pulmonary:     Effort: Pulmonary effort is normal.     Breath sounds: Normal breath sounds. No wheezing.  Abdominal:     General: Bowel sounds are normal.     Palpations: Abdomen is soft.     Tenderness: There is no abdominal tenderness.  Genitourinary:    Comments: Improved hematuria noted in foley bag, less blood tinged Musculoskeletal:        General: Normal range of motion.     Cervical back: Normal range of motion and neck supple.  Skin:    General: Skin is warm and dry.  Neurological:      General: No focal deficit present.     Mental Status: She is alert.  Psychiatric:        Mood and Affect: Mood normal.     Labs / Other Information My review of labs, imaging, notes and other tests is significant for Hgb 9.7 g/dL    Time spent: Greater than 50% of the 35 minute visit was spent in counseling/coordination of care for the patient as laid out in the A&P.  Dwyane Dee, MD [facility] 10/24/2020, 12:53 PM

## 2020-10-24 NOTE — Progress Notes (Signed)
Patient moved to room 1429 by wheelchair with Dawson Bills, [place]. Patient left ICU alert with no distress noted.

## 2020-10-24 NOTE — Assessment & Plan Note (Addendum)
-   Hemoglobin improved to 10 g/dL at time of discharge and expect it to continue to recover - Hematuria resolved prior to discharge, patient endorses voiding clear yellow urine

## 2020-10-25 DIAGNOSIS — A419 Sepsis, unspecified organism: Secondary | ICD-10-CM

## 2020-10-25 DIAGNOSIS — T83122A Displacement of urinary stent, initial encounter: Secondary | ICD-10-CM | POA: Diagnosis not present

## 2020-10-25 DIAGNOSIS — N1330 Unspecified hydronephrosis: Secondary | ICD-10-CM | POA: Diagnosis not present

## 2020-10-25 DIAGNOSIS — R319 Hematuria, unspecified: Secondary | ICD-10-CM | POA: Diagnosis not present

## 2020-10-25 LAB — BASIC METABOLIC PANEL
Anion gap: 6 (ref 5–15)
BUN: 9 mg/dL (ref 6–20)
CO2: 26 mmol/L (ref 22–32)
Calcium: 8.7 mg/dL — ABNORMAL LOW (ref 8.9–10.3)
Chloride: 109 mmol/L (ref 98–111)
Creatinine, Ser: 0.49 mg/dL (ref 0.44–1.00)
GFR, Estimated: >60 mL/min (ref >60)
Glucose, Bld: 88 mg/dL (ref 70–99)
Potassium: 3.4 mmol/L — ABNORMAL LOW (ref 3.5–5.1)
Sodium: 141 mmol/L (ref 135–145)

## 2020-10-25 LAB — CBC
HCT: 29.6 % — ABNORMAL LOW (ref 36.0–46.0)
Hemoglobin: 10 g/dL — ABNORMAL LOW (ref 12.0–15.0)
MCH: 32.9 pg (ref 26.0–34.0)
MCHC: 33.8 g/dL (ref 30.0–36.0)
MCV: 97.4 fL (ref 80.0–100.0)
Platelets: 252 10*3/uL (ref 150–400)
RBC: 3.04 MIL/uL — ABNORMAL LOW (ref 3.87–5.11)
RDW: 13.5 % (ref 11.5–15.5)
WBC: 23.1 10*3/uL — ABNORMAL HIGH (ref 4.0–10.5)
nRBC: 0 % (ref 0.0–0.2)

## 2020-10-25 LAB — MAGNESIUM: Magnesium: 1.8 mg/dL (ref 1.7–2.4)

## 2020-10-25 MED ORDER — HEPARIN SOD (PORK) LOCK FLUSH 100 UNIT/ML IV SOLN
250.0000 [IU] | INTRAVENOUS | Status: AC | PRN
  Administered 2020-10-25: 250 [IU]
  Filled 2020-10-25: qty 2.5

## 2020-10-25 MED ORDER — SULFAMETHOXAZOLE-TRIMETHOPRIM 800-160 MG PO TABS: 1.0000 | ORAL_TABLET | Freq: Two times a day (BID) | ORAL | 0 refills | Status: AC

## 2020-10-25 MED ORDER — SULFAMETHOXAZOLE-TRIMETHOPRIM 800-160 MG PO TABS
1.0000 | ORAL_TABLET | Freq: Two times a day (BID) | ORAL | Status: DC
  Administered 2020-10-25: 1 via ORAL
  Filled 2020-10-25: qty 1

## 2020-10-25 MED ORDER — POTASSIUM CHLORIDE CRYS ER 20 MEQ PO TBCR
40.0000 meq | EXTENDED_RELEASE_TABLET | Freq: Once | ORAL | Status: AC
  Administered 2020-10-25: 40 meq via ORAL
  Filled 2020-10-25: qty 2

## 2020-10-25 MED ORDER — SODIUM CHLORIDE 0.9% FLUSH: 10.0000 mL | Freq: Two times a day (BID) | INTRAVENOUS | Status: DC

## 2020-10-25 MED ORDER — OXYCODONE HCL 5 MG PO TABS
5.0000 mg | ORAL_TABLET | ORAL | 0 refills | Status: AC | PRN
Start: 2020-10-25 — End: 2020-10-30

## 2020-10-25 MED ORDER — SODIUM CHLORIDE 0.9% FLUSH: 10.0000 mL | INTRAVENOUS | Status: DC | PRN

## 2020-10-25 NOTE — Progress Notes (Signed)
Pt discharged to home, IVT here to cap PICC for home. Instructions reviewed with pt. D/C meds reviewed, acknowledged understanding. SRP, RN

## 2020-10-25 NOTE — Hospital Course (Signed)
[name] is a 45 y.o. 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 Patient reports that she has a right ureteral stent and had a right percutaneous nephrostomy tube that was removed on 10/12/2020.  She did underwent chemotherapy on 10/17/2020 and was treated with pegfilgrastrim on 10/19/2020.  She has been experiencing fatigue, nausea, and some vomiting which she attributed to the chemotherapy but then developed acute onset of severe right flank pain and gross hematuria the afternoon of admission.    Upon arrival to the ED, patient is found to be afebrile, saturating mid to upper 90s on room air, tachycardic to 150s, and with stable blood pressure. CT stone study notable for multiple bilateral nonobstructing intrarenal stones and concerning for dilated right renal pelvis, calyces, and ureter with wall thickening likely representing hemorrhage.  Also noted on the CT is a double-pigtail catheter coiled within the bladder.   Cultures were collected in the ER and patient was started on vancomycin, cefepime, Flagyl.  Urology was consulted and patient underwent cystoscopy with malpositioned stent removal and replaced with right ureteral stent.   See below for further A&P

## 2020-10-25 NOTE — TOC Transition Note (Signed)
Transition of Care [facility]) - CM/SW Discharge Note   Patient Details  Name: April [name] MRN: [id number] Date of Birth: 02/06/1975  Transition of Care [facility]) CM/SW Contact:  [name], RN Phone Number: 10/25/2020, 9:30 AM   Clinical Narrative:    Patient discharged to return to home with self care.  Orders checked for toc orders none found.   Final next level of care: Home/Self Care Barriers to Discharge: Barriers Resolved   Patient Goals and CMS Choice Patient states their goals for this hospitalization and ongoing recovery are:: to go home CMS Medicare.gov Compare Post Acute Care list provided to:: Patient    Discharge Placement                       Discharge Plan and Services   Discharge Planning Services: CM Consult                                 Social Determinants of Health (SDOH) Interventions     Readmission Risk Interventions No flowsheet data found.

## 2020-10-25 NOTE — Discharge Summary (Signed)
Physician Discharge Summary   Patient name: April [name]  Admit date:     10/22/2020  Discharge date: 10/25/2020  Discharge Physician: Dwyane Dee   PCP: Ruben Im, PA-C   Recommendations at discharge:  Follow up with [facility] urology  Discharge Diagnoses Principal Problem:   Hydronephrosis of right kidney Active Problems:   Malignant neoplasm of rectosigmoid (colon) (HCC)   Hypertension   Hypokalemia   Prolonged QT interval   Renal calculi   Resolved Diagnoses Resolved Problems:   Ureteral stent displacement ([place])   Sepsis ([place])   Hematuria   Hospital Course   April [name] is a 45 y.o. 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 Patient reports that she has a right ureteral stent and had a right percutaneous nephrostomy tube that was removed on 10/12/2020.  She did underwent chemotherapy on 10/17/2020 and was treated with pegfilgrastrim on 10/19/2020.  She has been experiencing fatigue, nausea, and some vomiting which she attributed to the chemotherapy but then developed acute onset of severe right flank pain and gross hematuria the afternoon of admission.    Upon arrival to the ED, patient is found to be afebrile, saturating mid to upper 90s on room air, tachycardic to 150s, and with stable blood pressure. CT stone study notable for multiple bilateral nonobstructing intrarenal stones and concerning for dilated right renal pelvis, calyces, and ureter with wall thickening likely representing hemorrhage.  Also noted on the CT is a double-pigtail catheter coiled within the bladder.   Cultures were collected in the ER and patient was started on vancomycin, cefepime, Flagyl.  Urology was consulted and patient underwent cystoscopy with malpositioned stent removal and replaced with right ureteral stent.   See below for further A&P   * Hydronephrosis of  right kidney - considered due to displaced R ureteral stent; now s/p stent removal and replacement   Hematuria-resolved as of 10/25/2020 - Hemoglobin improved to 10 g/dL at time of discharge and expect it to continue to recover - Hematuria resolved prior to discharge, patient endorses voiding clear yellow urine  Sepsis (HCC)-resolved as of 10/25/2020 -  tachycardia, leukocytosis; UA unable to be interpreted but presumed urinary source due to obstructed urinary tract and displaced ureteral stent -CT shows ureteral stent displacement.  Discharged with Bactrim to complete course. 10k colonies of diphtheroids (Corynebacterium species)  Ureteral stent displacement (HCC)-resolved as of 10/25/2020 - Recent right ureteral stent noted to be malpositioned on imaging on admission - Underwent cystoscopy with removal of malpositioned right ureteral stent and new stent placed -Urine culture growing Corynebacterium, 10,000 colonies.  Discharging on Bactrim to complete course -Hematuria improved and patient states that urine was clear yellow after Foley removed -She has follow-up with [facility] within a week  Malignant neoplasm of rectosigmoid (colon) ([place]) - undergoing active chemo outpatient; follows with Dr. Benay Spice   Renal calculi - see ureteral stent displacement   Prolonged QT interval - Continue telemetry - replacing electrolytes PRN  Hypokalemia - Replete and recheck as needed  Hypertension - Resume amlodipine, hydralazine, Lopressor      Procedures performed: Cystoscopy with right ureteral stent removal due to malposition and replacement with new right ureteral stent, 10/23/2020  Condition at discharge: stable  Exam Physical Exam Constitutional:      General: She is not in acute distress.    Appearance: Normal appearance. She is not ill-appearing.  HENT:  Head: Normocephalic and atraumatic.     Mouth/Throat:     Mouth: Mucous membranes are moist.  Eyes:      Extraocular Movements: Extraocular movements intact.  Cardiovascular:     Rate and Rhythm: Normal rate and regular rhythm.     Heart sounds: Normal heart sounds.  Pulmonary:     Effort: Pulmonary effort is normal.     Breath sounds: Normal breath sounds.  Abdominal:     General: Bowel sounds are normal. There is no distension.     Palpations: Abdomen is soft.     Tenderness: There is no abdominal tenderness.  Musculoskeletal:        General: Normal range of motion.     Cervical back: Normal range of motion and neck supple.  Skin:    General: Skin is warm and dry.  Neurological:     General: No focal deficit present.     Mental Status: She is alert.  Psychiatric:        Mood and Affect: Mood normal.        Behavior: Behavior normal.     Disposition: Home  Discharge time: greater than 30 minutes.   Allergies as of 10/25/2020       Reactions   Lisinopril Swelling   Lip swelling   Irinotecan Other (See Comments)   Stomach cramping Patient given 0.5 mL atropine IV and was able to continue infusion.  See Progress note on 10/03/20.    Leucovorin Calcium Other (See Comments)   Stomach cramping Patient given 0.5 mL atropine IV and was able to continue infusion. See Progress note on 10/03/20.         Medication List     STOP taking these medications    doxycycline 100 MG tablet Commonly known as: VIBRA-TABS   HYDROcodone-acetaminophen 5-325 MG tablet Commonly known as: NORCO/VICODIN   HYDROmorphone 2 MG tablet Commonly known as: Dilaudid   potassium chloride 10 MEQ tablet Commonly known as: KLOR-CON   sorbitol 70 % solution   tiZANidine 4 MG tablet Commonly known as: ZANAFLEX       TAKE these medications    amLODipine 10 MG tablet Commonly known as: NORVASC Take 1 tablet (10 mg total) by mouth daily.   dexamethasone 4 MG tablet Commonly known as: DECADRON Take 1 tablet (4 mg total) by mouth 2 (two) times daily with a meal. Start on day 2 of treatment  and take for 3 days with each cycle. What changed:  when to take this additional instructions   diphenoxylate-atropine 2.5-0.025 MG tablet Commonly known as: LOMOTIL Take 1 tablet by mouth 4 (four) times daily as needed for diarrhea or loose stools.   hydrALAZINE 10 MG tablet Commonly known as: APRESOLINE TAKE 1 TABLET(10 MG) BY MOUTH THREE TIMES DAILY What changed:  how much to take how to take this when to take this additional instructions   hydrochlorothiazide 25 MG tablet Commonly known as: HYDRODIURIL TAKE 1 TABLET BY MOUTH EVERY DAY   ibuprofen 200 MG tablet Commonly known as: ADVIL Take 400 mg by mouth every 6 (six) hours as needed for headache, fever or mild pain. What changed: Another medication with the same name was removed. Continue taking this medication, and follow the directions you see here.   lactose free nutrition Liqd Take 237 mLs by mouth daily.   metoprolol tartrate 25 MG tablet Commonly known as: LOPRESSOR Take 25 mg by mouth 2 (two) times daily.   ondansetron 4 MG tablet Commonly known  as: ZOFRAN Take 1 tablet (4 mg total) by mouth every 8 (eight) hours as needed for nausea or vomiting. Do not take for 3 days after chemo   oxyCODONE 5 MG immediate release tablet Commonly known as: Roxicodone Take 1 tablet (5 mg total) by mouth every 4 (four) hours as needed for up to 5 days.   prochlorperazine 10 MG tablet Commonly known as: COMPAZINE Take 1 tablet (10 mg total) by mouth every 6 (six) hours as needed for nausea or vomiting.   sulfamethoxazole-trimethoprim 800-160 MG tablet Commonly known as: BACTRIM DS Take 1 tablet by mouth 2 (two) times daily for 7 days. What changed: additional instructions        DG Chest 2 View  Result Date: 10/22/2020 CLINICAL DATA:  Right ureteral stent. Sudden onset of increased flank pain today. EXAM: CHEST - 2 VIEW COMPARISON:  CT 04/25/2020 FINDINGS: Right PICC line with tip over the low SVC region. Normal  heart size and pulmonary vascularity. No focal airspace disease or consolidation in the lungs. No blunting of costophrenic angles. No pneumothorax. Mediastinal contours appear intact. Mild thoracic scoliosis convex towards the right. IMPRESSION: No active cardiopulmonary disease. Electronically Signed   By: Lucienne Capers M.D.   On: 10/22/2020 20:45   DG C-Arm 1-60 Min-No Report  Result Date: 10/23/2020 Fluoroscopy was utilized by the requesting physician.  No radiographic interpretation.   CT Renal Stone Study  Result Date: 10/22/2020 CLINICAL DATA:  Flank pain. Kidney stones suspected. Right flank pain, nausea, and vomiting. EXAM: CT ABDOMEN AND PELVIS WITHOUT CONTRAST TECHNIQUE: Multidetector CT imaging of the abdomen and pelvis was performed following the standard protocol without IV contrast. COMPARISON:  08/10/2020 FINDINGS: Lower chest: Lung bases are clear. Hepatobiliary: No focal liver lesions. Gallbladder is contracted with diffusely increased density. This could represent multiple tiny stones, sludge, or milk of calcium. No bile duct dilatation. No inflammatory changes. Pancreas: Unremarkable. No pancreatic ductal dilatation or surrounding inflammatory changes. Spleen: Normal in size without focal abnormality. Adrenals/Urinary Tract: No adrenal gland nodules. Several stones are demonstrated in both kidneys. Largest is on the left, measuring 10 mm diameter. There is right-sided hydronephrosis with increased density in the urine in the renal pelvis and ureter. This suggests probable hemorrhage. No residual ureteral stone is demonstrated. There is a catheter coiled in the bladder likely representing a previous ureteral stent which has migrated. No bladder wall thickening. No bladder stones identified. Stomach/Bowel: Stomach, small bowel, and colon are not abnormally distended. No wall thickening or inflammatory changes are seen. 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 Scattered  stool throughout the colon. Appendix is normal. Vascular/Lymphatic: No significant vascular findings are present. No enlarged abdominal or pelvic lymph nodes. Reproductive: Uterus and bilateral adnexa are unremarkable. Other: No free air or free fluid in the abdomen. Abdominal wall musculature appears intact. 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 IMPRESSION: 1. Multiple bilateral nonobstructing intrarenal stones. 2. Dilated right renal pelvis, calices, and ureter with wall thickening and increased intraluminal density likely representing hemorrhage. 3. A double pigtail catheter is coiled in the bladder likely representing a previous ureteral catheter that has migrated. Electronically Signed   By: Lucienne Capers M.D.   On: 10/22/2020 20:51   Results for orders placed or performed during the hospital encounter of 10/22/20  Blood culture (routine single)     Status: None (Preliminary result)   Collection Time: 10/22/20  8:22 PM   Specimen: BLOOD  Result Value Ref Range  Status   Specimen Description   Final    BLOOD PICC LINE Performed at [facility], [place] [address] [place], [place] [address]    Special Requests   Final    BOTTLES DRAWN AEROBIC AND ANAEROBIC Blood Culture adequate volume Performed at [facility] [address] [place], [place] [address]    Culture   Final    NO GROWTH 2 DAYS Performed at [facility] [address], [place], [place] [address]    Report Status PENDING  Incomplete  Resp Panel by RT-PCR (Flu A&B, Covid) Nasopharyngeal Swab     Status: None   Collection Time: 10/22/20  9:25 PM   Specimen: Nasopharyngeal Swab; Nasopharyngeal(NP) swabs in vial transport medium  Result Value Ref Range Status   SARS Coronavirus 2 by RT PCR NEGATIVE NEGATIVE Final    Comment: (NOTE) SARS-CoV-2 target nucleic acids are NOT DETECTED.  The SARS-CoV-2 RNA is generally detectable in upper  respiratory specimens during the acute phase of infection. The lowest concentration of SARS-CoV-2 viral copies this assay can detect is 138 copies/mL. A negative result does not preclude SARS-Cov-2 infection and should not be used as the sole basis for treatment or other patient management decisions. A negative result may occur with  improper specimen collection/handling, submission of specimen other than nasopharyngeal swab, presence of viral mutation(s) within the areas targeted by this assay, and inadequate number of viral copies(<138 copies/mL). A negative result must be combined with clinical observations, patient history, and epidemiological information. The expected result is Negative.  Fact Sheet for Patients:  EntrepreneurPulse.com.au  Fact Sheet for Healthcare Providers:  IncredibleEmployment.be  This test is no t yet approved or cleared by the [place] FDA and  has been authorized for detection and/or diagnosis of SARS-CoV-2 by FDA under an Emergency Use Authorization (EUA). This EUA will remain  in effect (meaning this test can be used) for the duration of the COVID-19 declaration under Section 564(b)(1) of the Act, 21 U.S.C.section 360bbb-3(b)(1), unless the authorization is terminated  or revoked sooner.       Influenza A by PCR NEGATIVE NEGATIVE Final   Influenza B by PCR NEGATIVE NEGATIVE Final    Comment: (NOTE) The Xpert Xpress SARS-CoV-2/FLU/RSV plus assay is intended as an aid in the diagnosis of influenza from Nasopharyngeal swab specimens and should not be used as a sole basis for treatment. Nasal washings and aspirates are unacceptable for Xpert Xpress SARS-CoV-2/FLU/RSV testing.  Fact Sheet for Patients: EntrepreneurPulse.com.au  Fact Sheet for Healthcare Providers: IncredibleEmployment.be  This test is not yet approved or cleared by the [place] FDA and has been  authorized for detection and/or diagnosis of SARS-CoV-2 by FDA under an Emergency Use Authorization (EUA). This EUA will remain in effect (meaning this test can be used) for the duration of the COVID-19 declaration under Section 564(b)(1) of the Act, 21 U.S.C. section 360bbb-3(b)(1), unless the authorization is terminated or revoked.  Performed at [facility], [place] [address], [place], [place] [address]   Urine Culture     Status: Abnormal   Collection Time: 10/22/20 11:00 PM   Specimen: In/Out Cath Urine  Result Value Ref Range Status   Specimen Description   Final    IN/OUT CATH URINE Performed at [facility] [address] [place],  [address]    Special Requests   Final    NONE Performed at [facility], [place] [address] [place], [place]  [address]    Culture (A)  Final    10,000 COLONIES/mL DIPHTHEROIDS(CORYNEBACTERIUM SPECIES) Standardized susceptibility testing for this organism is not available. Performed at [facility], [place] [address], [place], [place] [address]    Report Status 10/24/2020 FINAL  Final  MRSA Next Gen by PCR, Nasal     Status: None   Collection Time: 10/23/20  1:34 AM   Specimen: Nasal Mucosa; Nasal Swab  Result Value Ref Range Status   MRSA by PCR Next Gen NOT DETECTED NOT DETECTED Final    Comment: (NOTE) The GeneXpert MRSA Assay (FDA approved for NASAL specimens only), is one component of a comprehensive MRSA colonization surveillance program. It is not intended to diagnose MRSA infection nor to guide or monitor treatment for MRSA infections. Test performance is not FDA approved in patients less than 1 years old. Performed at [facility], [place] [address], [place], [place] [address]     Signed:  Dwyane Dee MD.  [facility] 10/25/2020, 3:03 PM

## 2020-10-25 NOTE — Plan of Care (Signed)

## 2020-10-28 ENCOUNTER — Other Ambulatory Visit: Payer: Self-pay | Admitting: Oncology

## 2020-10-28 LAB — CULTURE, BLOOD (SINGLE)
Culture: NO GROWTH
Special Requests: ADEQUATE

## 2020-10-31 ENCOUNTER — Inpatient Hospital Stay: Payer: Medicaid Other

## 2020-10-31 ENCOUNTER — Other Ambulatory Visit: Payer: Self-pay

## 2020-10-31 ENCOUNTER — Telehealth: Payer: Self-pay

## 2020-10-31 ENCOUNTER — Inpatient Hospital Stay (HOSPITAL_BASED_OUTPATIENT_CLINIC_OR_DEPARTMENT_OTHER): Payer: Medicaid Other | Admitting: Oncology

## 2020-10-31 VITALS — HR 97

## 2020-10-31 VITALS — BP 126/100 | HR 102 | Temp 97.8°F | Resp 18 | Ht 67.0 in | Wt 151.2 lb

## 2020-10-31 DIAGNOSIS — Z79899 Other long term (current) drug therapy: Secondary | ICD-10-CM | POA: Diagnosis not present

## 2020-10-31 DIAGNOSIS — Z5189 Encounter for other specified aftercare: Secondary | ICD-10-CM | POA: Diagnosis not present

## 2020-10-31 DIAGNOSIS — C19 Malignant neoplasm of rectosigmoid junction: Secondary | ICD-10-CM

## 2020-10-31 DIAGNOSIS — C187 Malignant neoplasm of sigmoid colon: Secondary | ICD-10-CM | POA: Diagnosis present

## 2020-10-31 DIAGNOSIS — Z5111 Encounter for antineoplastic chemotherapy: Secondary | ICD-10-CM | POA: Diagnosis not present

## 2020-10-31 DIAGNOSIS — Z5112 Encounter for antineoplastic immunotherapy: Secondary | ICD-10-CM | POA: Diagnosis present

## 2020-10-31 LAB — CBC WITH DIFFERENTIAL (CANCER CENTER ONLY)
Abs Immature Granulocytes: 0.14 10*3/uL — ABNORMAL HIGH (ref 0.00–0.07)
Basophils Absolute: 0.1 10*3/uL (ref 0.0–0.1)
Basophils Relative: 1 %
Eosinophils Absolute: 0.3 10*3/uL (ref 0.0–0.5)
Eosinophils Relative: 3 %
HCT: 36.7 % (ref 36.0–46.0)
Hemoglobin: 12.4 g/dL (ref 12.0–15.0)
Immature Granulocytes: 1 %
Lymphocytes Relative: 33 %
Lymphs Abs: 3.5 10*3/uL (ref 0.7–4.0)
MCH: 31.4 pg (ref 26.0–34.0)
MCHC: 33.8 g/dL (ref 30.0–36.0)
MCV: 92.9 fL (ref 80.0–100.0)
Monocytes Absolute: 0.9 10*3/uL (ref 0.1–1.0)
Monocytes Relative: 8 %
Neutro Abs: 5.9 10*3/uL (ref 1.7–7.7)
Neutrophils Relative %: 54 %
Platelet Count: 345 10*3/uL (ref 150–400)
RBC: 3.95 MIL/uL (ref 3.87–5.11)
RDW: 14 % (ref 11.5–15.5)
WBC Count: 10.8 10*3/uL — ABNORMAL HIGH (ref 4.0–10.5)
nRBC: 0 % (ref 0.0–0.2)

## 2020-10-31 LAB — CMP (CANCER CENTER ONLY)
ALT: 12 U/L (ref 0–44)
AST: 14 U/L — ABNORMAL LOW (ref 15–41)
Albumin: 4.3 g/dL (ref 3.5–5.0)
Alkaline Phosphatase: 183 U/L — ABNORMAL HIGH (ref 38–126)
Anion gap: 9 (ref 5–15)
BUN: 8 mg/dL (ref 6–20)
CO2: 29 mmol/L (ref 22–32)
Calcium: 9.9 mg/dL (ref 8.9–10.3)
Chloride: 98 mmol/L (ref 98–111)
Creatinine: 0.53 mg/dL (ref 0.44–1.00)
GFR, Estimated: >60 mL/min (ref >60)
Glucose, Bld: 100 mg/dL — ABNORMAL HIGH (ref 70–99)
Potassium: 3.3 mmol/L — ABNORMAL LOW (ref 3.5–5.1)
Sodium: 136 mmol/L (ref 135–145)
Total Bilirubin: 0.2 mg/dL — ABNORMAL LOW (ref 0.3–1.2)
Total Protein: 7.2 g/dL (ref 6.5–8.1)

## 2020-10-31 LAB — CEA (ACCESS): CEA (CHCC): 2.53 ng/mL (ref 0.00–5.00)

## 2020-10-31 LAB — MAGNESIUM: Magnesium: 1.8 mg/dL (ref 1.7–2.4)

## 2020-10-31 MED ORDER — PALONOSETRON HCL INJECTION 0.25 MG/5ML
0.2500 mg | Freq: Once | INTRAVENOUS | Status: AC
  Administered 2020-10-31: 0.25 mg via INTRAVENOUS
  Filled 2020-10-31: qty 5

## 2020-10-31 MED ORDER — ATROPINE SULFATE 1 MG/ML IV SOLN
0.5000 mg | Freq: Once | INTRAVENOUS | Status: AC
  Administered 2020-10-31: 0.5 mg via INTRAVENOUS
  Filled 2020-10-31: qty 1

## 2020-10-31 MED ORDER — SODIUM CHLORIDE 0.9 % IV SOLN
10.0000 mg | Freq: Once | INTRAVENOUS | Status: AC
  Administered 2020-10-31: 10 mg via INTRAVENOUS
  Filled 2020-10-31: qty 1

## 2020-10-31 MED ORDER — SODIUM CHLORIDE 0.9 % IV SOLN
6.0000 mg/kg | Freq: Once | INTRAVENOUS | Status: AC
  Administered 2020-10-31: 400 mg via INTRAVENOUS
  Filled 2020-10-31: qty 20

## 2020-10-31 MED ORDER — FLUOROURACIL CHEMO INJECTION 2.5 GM/50ML
400.0000 mg/m2 | Freq: Once | INTRAVENOUS | Status: AC
  Administered 2020-10-31: 700 mg via INTRAVENOUS
  Filled 2020-10-31: qty 14

## 2020-10-31 MED ORDER — SODIUM CHLORIDE 0.9 % IV SOLN
400.0000 mg/m2 | Freq: Once | INTRAVENOUS | Status: AC
  Administered 2020-10-31: 720 mg via INTRAVENOUS
  Filled 2020-10-31: qty 36

## 2020-10-31 MED ORDER — POTASSIUM CHLORIDE CRYS ER 20 MEQ PO TBCR: 20.0000 meq | EXTENDED_RELEASE_TABLET | Freq: Every day | ORAL | 0 refills | Status: DC

## 2020-10-31 MED ORDER — SODIUM CHLORIDE 0.9 % IV SOLN
150.0000 mg | Freq: Once | INTRAVENOUS | Status: AC
  Administered 2020-10-31: 150 mg via INTRAVENOUS
  Filled 2020-10-31: qty 5

## 2020-10-31 MED ORDER — SODIUM CHLORIDE 0.9 % IV SOLN: Freq: Once | INTRAVENOUS | Status: AC

## 2020-10-31 MED ORDER — SODIUM CHLORIDE 0.9 % IV SOLN
180.0000 mg/m2 | Freq: Once | INTRAVENOUS | Status: AC
  Administered 2020-10-31: 320 mg via INTRAVENOUS
  Filled 2020-10-31: qty 15

## 2020-10-31 MED ORDER — SODIUM CHLORIDE 0.9 % IV SOLN
2400.0000 mg/m2 | INTRAVENOUS | Status: DC
  Administered 2020-10-31: 4300 mg via INTRAVENOUS
  Filled 2020-10-31: qty 86

## 2020-10-31 NOTE — Patient Instructions (Signed)
PICC Home Care Guide A peripherally inserted central catheter (PICC) is a form of IV access that allows medicines and IV fluids to be quickly distributed throughout the body. The PICC is a long, thin, flexible tube (catheter) that is inserted into a vein in the upper arm. The catheter ends in a large vein in the chest (superior vena cava, or SVC). After the PICC is inserted, a chest X-ray may be done to make sure that it is in the correct place. A PICC may be placed for different reasons, such as: To give medicines and liquid nutrition. To give IV fluids and blood products. If there is trouble placing a peripheral intravenous (PIV) catheter. If taken care of properly, a PICC can remain in place for several months. Having a PICC can also allow a person to go home from the hospital sooner. Medicine and PICC care can be managed at home by a family member, caregiver, or home health care team. What are the risks? Generally, having a PICC is safe. However, problems may occur, including: A blood clot (thrombus) forming in or at the tip of the PICC. A blood clot forming in a vein (deep vein thrombosis) or traveling to the lung (pulmonary embolism). Inflammation of the vein (phlebitis) in which the PICC is placed. Infection. Central line associated blood stream infection (CLABSI) is a serious infection that often requires hospitalization. PICC movement (malposition). The PICC tip may move from its original position due to excessive physical activity, forceful coughing, sneezing, or vomiting. A break or cut in the PICC. It is important not to use scissors near the PICC. Nerve or tendon irritation or injury during PICC insertion. How to take care of your PICC Preventing problems You and any caregivers should wash your hands often with soap. Wash hands: Before touching the PICC line or the infusion device. Before changing a bandage (dressing). Flush the PICC as told by your health care provider. Let your  health care provider know right away if the PICC is hard to flush or does not flush. Do not use force to flush the PICC. Do not use a syringe that is less than 10 mL to flush the PICC. Avoid blood pressure checks on the arm in which the PICC is placed. Never pull or tug on the PICC. Do not take the PICC out yourself. Only a trained clinical professional should remove the PICC. Use clean and sterile supplies only. Keep the supplies in a dry place. Do not reuse needles, syringes, or any other supplies. Doing that can lead to infection. Keep pets and children away from your PICC line. Check the PICC insertion site every day for signs of infection. Check for: Leakage. Redness, swelling, or pain. Fluid or blood. Warmth. Pus or a bad smell. PICC dressing care Keep your PICC bandage (dressing) clean and dry to prevent infection. Do not take baths, swim, or use a hot tub until your health care provider approves. Ask your health care provider if you can take showers. You may only be allowed to take sponge baths for bathing. When you are allowed to shower: Ask your health care provider to teach you how to wrap the PICC line. Cover the PICC line with clear plastic wrap and tape to keep it dry while showering. Follow instructions from your health care provider about how to take care of your insertion site and dressing. Make sure you: Wash your hands with soap and water before you change your bandage (dressing). If soap and water are  not available, use hand sanitizer. Change your dressing as told by your health care provider. Leave stitches (sutures), skin glue, or adhesive strips in place. These skin closures may need to stay in place for 2 weeks or longer. If adhesive strip edges start to loosen and curl up, you may trim the loose edges. Do not remove adhesive strips completely unless your health care provider tells you to do that. Change your PICC dressing if it becomes loose or wet. General  instructions  Carry your PICC identification card or wear a medical alert bracelet at all times. Keep the tube clamped at all times, unless it is being used. Carry a smooth-edge clamp with you at all times to place on the tube if it breaks. Do not use scissors or sharp objects near the tube. You may bend your arm and move it freely. If your PICC is near or at the bend of your elbow, avoid activity with repeated motion at the elbow. Avoid lifting heavy objects as told by your health care provider. Keep all follow-up visits as told by your health care provider. This is important. Disposal of supplies Throw away any syringes in a disposal container that is meant for sharp items (sharps container). You can buy a sharps container from a pharmacy, or you can make one by using an empty hard plastic bottle with a cover. Place any used dressings or infusion bags into a plastic bag. Throw that bag in the trash. Contact a health care provider if: You have pain in your arm, ear, face, or teeth. You have a fever or chills. You have redness, swelling, or pain around the insertion site. You have fluid or blood coming from the insertion site. Your insertion site feels warm to the touch. You have pus or a bad smell coming from the insertion site. Your skin feels hard and raised around the insertion site. Get help right away if: Your PICC is accidentally pulled all the way out. If this happens, cover the insertion site with a bandage or gauze dressing. Do not throw the PICC away. Your health care provider will need to check it. Your PICC was tugged or pulled and has partially come out. Do not  push the PICC back in. You cannot flush the PICC, it is hard to flush, or the PICC leaks around the insertion site when it is flushed. You hear a "flushing" sound when the PICC is flushed. You feel your heart racing or skipping beats. There is a hole or tear in the PICC. You have swelling in the arm in which the PICC  was inserted. You have a red streak going up your arm from where the PICC was inserted. Summary A peripherally inserted central catheter (PICC) is a long, thin, flexible tube (catheter) that is inserted into a vein in the upper arm. The PICC is inserted using a sterile technique by a specially trained nurse or physician. Only a trained clinical professional should remove it. Keep your PICC identification card with you at all times. Avoid blood pressure checks on the arm in which the PICC is placed. If cared for properly, a PICC can remain in place for several months. Having a PICC can also allow a person to go home from the hospital sooner. This information is not intended to replace advice given to you by your health care provider. Make sure you discuss any questions you have with your health care provider. Document Revised: 05/07/2019 Document Reviewed: 05/11/2019 Elsevier Patient Education  2022  [employer].

## 2020-10-31 NOTE — Progress Notes (Signed)
Patient presents for treatment. RN assessment completed along with the following:  Labs/vitals reviewed - Yes, and within treatment parameters.   Weight within 10% of previous measurement - Yes Oncology Treatment Attestation completed for current therapy- Yes, on date 09/07/20 Informed consent completed and reflects current therapy/intent - Yes, on date 08/19/20             Provider progress note reviewed - Today's provider note is not yet available. I reviewed the most recent oncology provider progress note in chart dated 10/17/20. Treatment/Antibody/Supportive plan reviewed - Yes, and there are no adjustments needed for today's treatment. S&H and other orders reviewed - Yes, and there are no additional orders identified. Previous treatment date reviewed - Yes, and the appropriate amount of time has elapsed between treatments.   Patient to proceed with treatment.

## 2020-10-31 NOTE — Patient Instructions (Signed)

## 2020-10-31 NOTE — Progress Notes (Signed)
[place] OFFICE PROGRESS NOTE   Diagnosis: Colon cancer  INTERVAL HISTORY:   April [name] completed on cycle of FOLFIRI/panitumumab on 10/17/2020.  She had nausea for several days following chemotherapy despite Decadron prophylaxis.  She reports sores on the tongue.  The rash over the face has progressed.  She has intermittent diarrhea that has not changed since beginning chemotherapy. She underwent removal of the ureter stent on 10/29/2020.  She was admitted on 10/25/2020 with flank pain and gross hematuria.  She underwent removal of a malpositioned right ureter stent and placement of a new stent.  She was discharged home on Bactrim, but has not started the Bactrim.  No recurrent hematuria or urinary symptoms.   Objective:  Vital signs in last 24 hours:  Blood pressure (!) 126/100, pulse (!) 102, temperature 97.8 F (36.6 C), temperature source Oral, resp. rate 18, height $RemoveBe'5\' 7"'[id number]$  (1.702 m), weight 151 lb 3.2 oz (68.6 kg), last menstrual period 01/07/2006, SpO2 100 %.    HEENT: 2 mm ulcer at the left side of the tongue, no thrush Resp: Lungs clear bilaterally Cardio: Regular rate and rhythm GI: No hepatosplenomegaly, mild tenderness in the low abdomen, no mass Vascular: No leg edema  Skin: Acne type rash over the face, palms without erythema  Portacath/PICC-without erythema  Lab Results:  Lab Results  Component Value Date   WBC 10.8 (H) 10/31/2020   HGB 12.4 10/31/2020   HCT 36.7 10/31/2020   MCV 92.9 10/31/2020   PLT 345 10/31/2020   NEUTROABS 5.9 10/31/2020    CMP  Lab Results  Component Value Date   NA 141 10/25/2020   K 3.4 (L) 10/25/2020   CL 109 10/25/2020   CO2 26 10/25/2020   GLUCOSE 88 10/25/2020   BUN 9 10/25/2020   CREATININE 0.49 10/25/2020   CALCIUM 8.7 (L) 10/25/2020   PROT 6.8 10/23/2020   ALBUMIN 3.9 10/23/2020   AST 16 10/23/2020   ALT 16 10/23/2020   ALKPHOS 183 (H) 10/23/2020   BILITOT 0.8 10/23/2020   GFRNONAA >60  10/25/2020   GFRAA >60 10/17/2019    Lab Results  Component Value Date   CEA1 8.66 (H) 07/02/2020   CEA 2.96 10/17/2020    Medications: I have reviewed the patient's current medications.   Assessment/Plan: Sigmoid colon cancer, stage IV (pT4a,pN2b,M1c) Colonoscopy 03/24/2019-3 rectal polyps-hyperplastic polyps, distal colon biopsy-at least intramucosal adenocarcinoma, completely obstructing mass in the distal sigmoid colon, could not be traversed, intact mismatch repair protein expression 03/24/2019-CEA 56.1 03/29/2019 CT abdomen/pelvis-circumferential thickening involving the entire mid and distal sigmoid colon to the level of the rectosigmoid junction, solid/cystic mass in the left ovary, bilateral nephrolithiasis Robotic assisted low anterior resection, mesenteric lymphadenectomy, bilateral salpingo-oophorectomy 04/08/2019 Pathology ([facility] review of outside pathology) metastatic adenocarcinoma involving the peritoneum overlying the round ligament, serosa of the urinary bladder, serosa of the right ureter a sacral area, pelvic peritoneum, and left ovary.  Omental biopsy with focal mucin pools with no carcinoma cells identified, right hemidiaphragm biopsy involved by metastatic adenocarcinoma, invasive adenocarcinoma the sigmoid colon, moderately differentiated, T4a, perineural and vascular invasion present, 7/22 lymph nodes, multiple tumor deposits, resection margins negative Negative for PD-L1, low probability of MSI-high, HER-2 negative, negative for BRAF, NRAS and KRAS alterations CTs 05/19/2019-no evidence of metastatic disease, findings suspicious for colitis of the transverse and ascending colon, small amount of ascites in the cul-de-sac, bilateral renal calculi Cycle 1 FOLFIRI 05/24/2019, bevacizumab added with cycle 2 Cycle 4 FOLFIRI/bevacizumab 07/05/2019 Cycle 5 FOLFIRI 07/27/2019,  bevacizumab held secondary to hypertension Cycle 6 FOLFIRI 08/10/1999, bevacizumab held secondary to  hypertension CTs 08/30/2019-no evidence of recurrent disease, new subsolid right lower lobe nodule felt to be inflammatory, emphysema Cycle 7 FOLFIRI 09/26/2019, bevacizumab remains on hold secondary to hypertension Cycle 8 FOLFIRI 10/17/2019, bevacizumab held secondary to hypertension, Udenyca added for neutropenia Cycle 9 FOLFIRI 11/07/2019, bevacizumab held secondary to hypertension, Udenyca Cycle 10 FOLFIRI 11/28/2019, bevacizumab held, Udenyca Cycle 11 FOLFIRI 12/26/2019, bevacizumab held, Udenyca CTs 01/25/2020-no evidence of recurrent disease, resolution of right lower lobe nodule Maintenance Xeloda beginning 02/06/2020 CTs 04/25/2020- no evidence of metastatic disease, multiple bilateral renal calculi without hydronephrosis Maintenance Xeloda continued CT abdomen/pelvis without contrast 08/10/2020-bilateral staghorn renal calculi, no evidence of metastatic disease; addendum 08/23/2020-small but increasing omental nodules. Cycle 1 FOLFIRI/panitumumab 09/19/2020 Cycle 2 FOLFIRI/Panitumumab 10/03/2020 Cycle 3 FOLFIRI/Panitumumab 10/17/2020 Cycle 4 FOLFIRI/panitumumab 10/31/2020   Hypertension G4 P3, twins Kidney stones-bilateral staghorn renal calculi on CT 08/10/2020 Infected Port-A-Cath 09/12/2019-placed on Augmentin, referred for Port-A-Cath removal; Port-A-Cath removed 09/14/2019; PICC line placed 09/14/2019; culture staph aureus.  Course of Septra completed. Neutropenia secondary to chemotherapy-Udenyca added with cycle 8 FOLFIRI Right nephrostomy tube 09/12/2020; stent placed 10/09/2020, percutaneous nephrostolithotomy treatment of right-sided kidney stones 10/09/2020, malpositioned right ureter stent replacement 10/23/2020, right ureter stent removed 10/29/2020       Disposition: April [name] has completed 3 cycles of FOLFIRI/panitumumab.  She has tolerated the chemotherapy well, though she has delayed nausea.  She will continue Decadron prophylaxis.  We added lorazepam for nausea today.  She  will use hydrocortisone cream as needed for the face rash.  She continues doxycycline.  The CEA was normal when she was here 2 weeks ago.  She will complete cycle 4 FOLFIRI/panitumumab today.  She will use Magic mouthwash and Orajel for the mouth ulcers.  She will be scheduled for a restaging CT after cycle 5 FOLFIRI/panitumumab.  Betsy Coder, MD  10/31/2020  9:06 AM

## 2020-10-31 NOTE — Telephone Encounter (Signed)
Patient seen by Dr. Sherrill today ? ?Vitals are within treatment parameters. ? ?Labs reviewed by Dr. Sherrill and are within treatment parameters. ? ?Per physician team, patient is ready for treatment and there are NO modifications to the treatment plan.  ?

## 2020-11-02 ENCOUNTER — Other Ambulatory Visit: Payer: Self-pay

## 2020-11-02 ENCOUNTER — Inpatient Hospital Stay: Payer: Medicaid Other

## 2020-11-02 DIAGNOSIS — Z5111 Encounter for antineoplastic chemotherapy: Secondary | ICD-10-CM | POA: Diagnosis not present

## 2020-11-02 DIAGNOSIS — C19 Malignant neoplasm of rectosigmoid junction: Secondary | ICD-10-CM

## 2020-11-02 MED ORDER — HEPARIN SOD (PORK) LOCK FLUSH 100 UNIT/ML IV SOLN
500.0000 [IU] | Freq: Once | INTRAVENOUS | Status: AC | PRN
Start: 2020-11-02 — End: 2020-11-02
  Administered 2020-11-02: 500 [IU]

## 2020-11-02 MED ORDER — PEGFILGRASTIM-CBQV 6 MG/0.6ML ~~LOC~~ SOSY
6.0000 mg | PREFILLED_SYRINGE | Freq: Once | SUBCUTANEOUS | Status: AC
  Administered 2020-11-02: 6 mg via SUBCUTANEOUS

## 2020-11-02 MED ORDER — SODIUM CHLORIDE 0.9% FLUSH
10.0000 mL | INTRAVENOUS | Status: DC | PRN
  Administered 2020-11-02: 10 mL

## 2020-11-02 NOTE — Patient Instructions (Signed)
[facility]  Discharge Instructions: Thank you for choosing [facility] to provide your oncology and hematology care.   If you have a lab appointment with the Cancer Center, please go directly to the Cancer Center and check in at the registration area.   Wear comfortable clothing and clothing appropriate for easy access to any Portacath or PICC line.   We strive to give you quality time with your provider. You may need to reschedule your appointment if you arrive late (15 or more minutes).  Arriving late affects you and other patients whose appointments are after yours.  Also, if you miss three or more appointments without notifying the office, you may be dismissed from the clinic at the provider's discretion.      For prescription refill requests, have your pharmacy contact our office and allow 72 hours for refills to be completed.    Today you received the following chemotherapy and/or immunotherapy agents UDENCYA      To help prevent nausea and vomiting after your treatment, we encourage you to take your nausea medication as directed.  BELOW ARE SYMPTOMS THAT SHOULD BE REPORTED IMMEDIATELY: *FEVER GREATER THAN 100.4 F (38 C) OR HIGHER *CHILLS OR SWEATING *NAUSEA AND VOMITING THAT IS NOT CONTROLLED WITH YOUR NAUSEA MEDICATION *UNUSUAL SHORTNESS OF BREATH *UNUSUAL BRUISING OR BLEEDING *URINARY PROBLEMS (pain or burning when urinating, or frequent urination) *BOWEL PROBLEMS (unusual diarrhea, constipation, pain near the anus) TENDERNESS IN MOUTH AND THROAT WITH OR WITHOUT PRESENCE OF ULCERS (sore throat, sores in mouth, or a toothache) UNUSUAL RASH, SWELLING OR PAIN  UNUSUAL VAGINAL DISCHARGE OR ITCHING   Items with * indicate a potential emergency and should be followed up as soon as possible or go to the Emergency Department if any problems should occur.  Please show the CHEMOTHERAPY ALERT CARD or IMMUNOTHERAPY ALERT CARD at check-in to the  Emergency Department and triage nurse.  Should you have questions after your visit or need to cancel or reschedule your appointment, please contact [facility]  Dept: [phone]  and follow the prompts.  Office hours are 8:00 a.m. to 4:30 p.m. Monday - Friday. Please note that voicemails left after 4:00 p.m. may not be returned until the following business day.  We are closed weekends and major holidays. You have access to a nurse at all times for urgent questions. Please call the main number to the clinic Dept: [phone] and follow the prompts.   For any non-urgent questions, you may also contact your provider using MyChart. We now offer e-Visits for anyone 18 and older to request care online for non-urgent symptoms. For details visit [email or web address].   Also download the MyChart app! Go to the app store, search "MyChart", open the app, select [facility], and log in with your MyChart username and password.  Due to Covid, a mask is required upon entering the hospital/clinic. If you do not have a mask, one will be given to you upon arrival. For doctor visits, patients may have 1 support person aged 18 or older with them. For treatment visits, patients cannot have anyone with them due to current Covid guidelines and our immunocompromised population.   Pegfilgrastim injection What is this medication? PEGFILGRASTIM (PEG fil gra stim) is a long-acting granulocyte colony-stimulating factor that stimulates the growth of neutrophils, a type of white blood cell important in the body's fight against infection. It is used to reduce the incidence of fever and infection in patients   with certain types of cancer who are receiving chemotherapy that affects the bone marrow, and to increase survival after being exposed to high doses of radiation. This medicine may be used for other purposes; ask your health care provider or pharmacist if you have questions. COMMON BRAND NAME(S):  Fulphila, Neulasta, Nyvepria, UDENYCA, Ziextenzo What should I tell my care team before I take this medication? They need to know if you have any of these conditions: kidney disease latex allergy ongoing radiation therapy sickle cell disease skin reactions to acrylic adhesives (On-Body Injector only) an unusual or allergic reaction to pegfilgrastim, filgrastim, other medicines, foods, dyes, or preservatives pregnant or trying to get pregnant breast-feeding How should I use this medication? This medicine is for injection under the skin. If you get this medicine at home, you will be taught how to prepare and give the pre-filled syringe or how to use the On-body Injector. Refer to the patient Instructions for Use for detailed instructions. Use exactly as directed. Tell your healthcare provider immediately if you suspect that the On-body Injector may not have performed as intended or if you suspect the use of the On-body Injector resulted in a missed or partial dose. It is important that you put your used needles and syringes in a special sharps container. Do not put them in a trash can. If you do not have a sharps container, call your pharmacist or healthcare provider to get one. Talk to your pediatrician regarding the use of this medicine in children. While this drug may be prescribed for selected conditions, precautions do apply. Overdosage: If you think you have taken too much of this medicine contact a poison control center or emergency room at once. NOTE: This medicine is only for you. Do not share this medicine with others. What if I miss a dose? It is important not to miss your dose. Call your doctor or health care professional if you miss your dose. If you miss a dose due to an On-body Injector failure or leakage, a new dose should be administered as soon as possible using a single prefilled syringe for manual use. What may interact with this medication? Interactions have not been  studied. This list may not describe all possible interactions. Give your health care provider a list of all the medicines, herbs, non-prescription drugs, or dietary supplements you use. Also tell them if you smoke, drink alcohol, or use illegal drugs. Some items may interact with your medicine. What should I watch for while using this medication? Your condition will be monitored carefully while you are receiving this medicine. You may need blood work done while you are taking this medicine. Talk to your health care provider about your risk of cancer. You may be more at risk for certain types of cancer if you take this medicine. If you are going to need a MRI, CT scan, or other procedure, tell your doctor that you are using this medicine (On-Body Injector only). What side effects may I notice from receiving this medication? Side effects that you should report to your doctor or health care professional as soon as possible: allergic reactions (skin rash, itching or hives, swelling of the face, lips, or tongue) back pain dizziness fever pain, redness, or irritation at site where injected pinpoint red spots on the skin red or dark-brown urine shortness of breath or breathing problems stomach or side pain, or pain at the shoulder swelling tiredness trouble passing urine or change in the amount of urine unusual bruising or bleeding   Side effects that usually do not require medical attention (report to your doctor or health care professional if they continue or are bothersome): bone pain muscle pain This list may not describe all possible side effects. Call your doctor for medical advice about side effects. You may report side effects to FDA at 1-800-FDA-1088. Where should I keep my medication? Keep out of the reach of children. If you are using this medicine at home, you will be instructed on how to store it. Throw away any unused medicine after the expiration date on the label. NOTE: This sheet  is a summary. It may not cover all possible information. If you have questions about this medicine, talk to your doctor, pharmacist, or health care provider.  2022 Elsevier/Gold Standard (2020-01-27 11:54:14)   

## 2020-11-11 ENCOUNTER — Other Ambulatory Visit: Payer: Self-pay | Admitting: Oncology

## 2020-11-14 ENCOUNTER — Inpatient Hospital Stay: Payer: Medicaid Other | Attending: Oncology

## 2020-11-14 ENCOUNTER — Encounter: Payer: Self-pay | Admitting: Nurse Practitioner

## 2020-11-14 ENCOUNTER — Inpatient Hospital Stay: Payer: Medicaid Other

## 2020-11-14 ENCOUNTER — Other Ambulatory Visit: Payer: Self-pay

## 2020-11-14 ENCOUNTER — Inpatient Hospital Stay (HOSPITAL_BASED_OUTPATIENT_CLINIC_OR_DEPARTMENT_OTHER): Payer: Medicaid Other | Admitting: Nurse Practitioner

## 2020-11-14 VITALS — BP 105/83 | HR 96 | Temp 97.2°F | Resp 18 | Ht 67.0 in | Wt 162.0 lb

## 2020-11-14 DIAGNOSIS — C19 Malignant neoplasm of rectosigmoid junction: Secondary | ICD-10-CM

## 2020-11-14 DIAGNOSIS — Z79899 Other long term (current) drug therapy: Secondary | ICD-10-CM | POA: Insufficient documentation

## 2020-11-14 DIAGNOSIS — C187 Malignant neoplasm of sigmoid colon: Secondary | ICD-10-CM | POA: Insufficient documentation

## 2020-11-14 DIAGNOSIS — Z5111 Encounter for antineoplastic chemotherapy: Secondary | ICD-10-CM | POA: Insufficient documentation

## 2020-11-14 DIAGNOSIS — Z5112 Encounter for antineoplastic immunotherapy: Secondary | ICD-10-CM | POA: Insufficient documentation

## 2020-11-14 LAB — CMP (CANCER CENTER ONLY)
ALT: 18 U/L (ref 0–44)
AST: 19 U/L (ref 15–41)
Albumin: 4 g/dL (ref 3.5–5.0)
Alkaline Phosphatase: 171 U/L — ABNORMAL HIGH (ref 38–126)
Anion gap: 10 (ref 5–15)
BUN: 9 mg/dL (ref 6–20)
CO2: 32 mmol/L (ref 22–32)
Calcium: 9.3 mg/dL (ref 8.9–10.3)
Chloride: 95 mmol/L — ABNORMAL LOW (ref 98–111)
Creatinine: 0.69 mg/dL (ref 0.44–1.00)
GFR, Estimated: >60 mL/min (ref >60)
Glucose, Bld: 134 mg/dL — ABNORMAL HIGH (ref 70–99)
Potassium: 3.2 mmol/L — ABNORMAL LOW (ref 3.5–5.1)
Sodium: 137 mmol/L (ref 135–145)
Total Bilirubin: 0.2 mg/dL — ABNORMAL LOW (ref 0.3–1.2)
Total Protein: 7 g/dL (ref 6.5–8.1)

## 2020-11-14 LAB — CBC WITH DIFFERENTIAL (CANCER CENTER ONLY)
Abs Immature Granulocytes: 0.08 10*3/uL — ABNORMAL HIGH (ref 0.00–0.07)
Basophils Absolute: 0.1 10*3/uL (ref 0.0–0.1)
Basophils Relative: 1 %
Eosinophils Absolute: 0.4 10*3/uL (ref 0.0–0.5)
Eosinophils Relative: 4 %
HCT: 36.2 % (ref 36.0–46.0)
Hemoglobin: 11.8 g/dL — ABNORMAL LOW (ref 12.0–15.0)
Immature Granulocytes: 1 %
Lymphocytes Relative: 26 %
Lymphs Abs: 2.4 10*3/uL (ref 0.7–4.0)
MCH: 31.1 pg (ref 26.0–34.0)
MCHC: 32.6 g/dL (ref 30.0–36.0)
MCV: 95.5 fL (ref 80.0–100.0)
Monocytes Absolute: 0.8 10*3/uL (ref 0.1–1.0)
Monocytes Relative: 8 %
Neutro Abs: 5.4 10*3/uL (ref 1.7–7.7)
Neutrophils Relative %: 60 %
Platelet Count: 292 10*3/uL (ref 150–400)
RBC: 3.79 MIL/uL — ABNORMAL LOW (ref 3.87–5.11)
RDW: 15.6 % — ABNORMAL HIGH (ref 11.5–15.5)
WBC Count: 9.1 10*3/uL (ref 4.0–10.5)
nRBC: 0 % (ref 0.0–0.2)

## 2020-11-14 LAB — MAGNESIUM: Magnesium: 1.8 mg/dL (ref 1.7–2.4)

## 2020-11-14 LAB — CEA (ACCESS): CEA (CHCC): 2.44 ng/mL (ref 0.00–5.00)

## 2020-11-14 MED ORDER — ATROPINE SULFATE 1 MG/ML IV SOLN
0.5000 mg | Freq: Once | INTRAVENOUS | Status: AC
  Administered 2020-11-14: 0.5 mg via INTRAVENOUS
  Filled 2020-11-14: qty 1

## 2020-11-14 MED ORDER — SODIUM CHLORIDE 0.9 % IV SOLN
1800.0000 mg/m2 | INTRAVENOUS | Status: DC
  Administered 2020-11-14: 3250 mg via INTRAVENOUS
  Filled 2020-11-14: qty 65

## 2020-11-14 MED ORDER — SODIUM CHLORIDE 0.9 % IV SOLN
10.0000 mg | Freq: Once | INTRAVENOUS | Status: AC
  Administered 2020-11-14: 10 mg via INTRAVENOUS
  Filled 2020-11-14: qty 1

## 2020-11-14 MED ORDER — SODIUM CHLORIDE 0.9 % IV SOLN
150.0000 mg | Freq: Once | INTRAVENOUS | Status: AC
  Administered 2020-11-14: 150 mg via INTRAVENOUS
  Filled 2020-11-14: qty 5

## 2020-11-14 MED ORDER — SODIUM CHLORIDE 0.9 % IV SOLN
180.0000 mg/m2 | Freq: Once | INTRAVENOUS | Status: AC
  Administered 2020-11-14: 320 mg via INTRAVENOUS
  Filled 2020-11-14: qty 16

## 2020-11-14 MED ORDER — SODIUM CHLORIDE 0.9 % IV SOLN
6.0000 mg/kg | Freq: Once | INTRAVENOUS | Status: AC
  Administered 2020-11-14: 400 mg via INTRAVENOUS
  Filled 2020-11-14: qty 20

## 2020-11-14 MED ORDER — FLUOROURACIL CHEMO INJECTION 500 MG/10ML
200.0000 mg/m2 | Freq: Once | INTRAVENOUS | Status: AC
  Administered 2020-11-14: 350 mg via INTRAVENOUS
  Filled 2020-11-14: qty 7

## 2020-11-14 MED ORDER — SODIUM CHLORIDE 0.9 % IV SOLN
200.0000 mg/m2 | Freq: Once | INTRAVENOUS | Status: AC
  Administered 2020-11-14: 360 mg via INTRAVENOUS
  Filled 2020-11-14: qty 18

## 2020-11-14 MED ORDER — PALONOSETRON HCL INJECTION 0.25 MG/5ML
0.2500 mg | Freq: Once | INTRAVENOUS | Status: AC
  Administered 2020-11-14: 0.25 mg via INTRAVENOUS
  Filled 2020-11-14: qty 5

## 2020-11-14 MED ORDER — SODIUM CHLORIDE 0.9 % IV SOLN: Freq: Once | INTRAVENOUS | Status: AC

## 2020-11-14 NOTE — Patient Instructions (Addendum)
PICC Home Care Guide A peripherally inserted central catheter (PICC) is a form of IV access that allows medicines and IV fluids to be quickly distributed throughout the body. The PICC is a long, thin, flexible tube (catheter) that is inserted into a vein in the upper arm. The catheter ends in a large vein in the chest (superior vena cava, or SVC). After the PICC is inserted, a chest X-ray may be done to make sure that it is in the correct place. A PICC may be placed for different reasons, such as: To give medicines and liquid nutrition. To give IV fluids and blood products. If there is trouble placing a peripheral intravenous (PIV) catheter. If taken care of properly, a PICC can remain in place for several months. Having a PICC can also allow a person to go home from the hospital sooner. Medicine and PICC care can be managed at home by a family member, caregiver, or home health care team. What are the risks? Generally, having a PICC is safe. However, problems may occur, including: A blood clot (thrombus) forming in or at the tip of the PICC. A blood clot forming in a vein (deep vein thrombosis) or traveling to the lung (pulmonary embolism). Inflammation of the vein (phlebitis) in which the PICC is placed. Infection. Central line associated blood stream infection (CLABSI) is a serious infection that often requires hospitalization. PICC movement (malposition). The PICC tip may move from its original position due to excessive physical activity, forceful coughing, sneezing, or vomiting. A break or cut in the PICC. It is important not to use scissors near the PICC. Nerve or tendon irritation or injury during PICC insertion. How to take care of your PICC Preventing problems You and any caregivers should wash your hands often with soap. Wash hands: Before touching the PICC line or the infusion device. Before changing a bandage (dressing). Flush the PICC as told by your health care provider. Let your  health care provider know right away if the PICC is hard to flush or does not flush. Do not use force to flush the PICC. Do not use a syringe that is less than 10 mL to flush the PICC. Avoid blood pressure checks on the arm in which the PICC is placed. Never pull or tug on the PICC. Do not take the PICC out yourself. Only a trained clinical professional should remove the PICC. Use clean and sterile supplies only. Keep the supplies in a dry place. Do not reuse needles, syringes, or any other supplies. Doing that can lead to infection. Keep pets and children away from your PICC line. Check the PICC insertion site every day for signs of infection. Check for: Leakage. Redness, swelling, or pain. Fluid or blood. Warmth. Pus or a bad smell. PICC dressing care Keep your PICC bandage (dressing) clean and dry to prevent infection. Do not take baths, swim, or use a hot tub until your health care provider approves. Ask your health care provider if you can take showers. You may only be allowed to take sponge baths for bathing. When you are allowed to shower: Ask your health care provider to teach you how to wrap the PICC line. Cover the PICC line with clear plastic wrap and tape to keep it dry while showering. Follow instructions from your health care provider about how to take care of your insertion site and dressing. Make sure you: Wash your hands with soap and water before you change your bandage (dressing). If soap and water are  not available, use hand sanitizer. Change your dressing as told by your health care provider. Leave stitches (sutures), skin glue, or adhesive strips in place. These skin closures may need to stay in place for 2 weeks or longer. If adhesive strip edges start to loosen and curl up, you may trim the loose edges. Do not remove adhesive strips completely unless your health care provider tells you to do that. Change your PICC dressing if it becomes loose or wet. General  instructions  Carry your PICC identification card or wear a medical alert bracelet at all times. Keep the tube clamped at all times, unless it is being used. Carry a smooth-edge clamp with you at all times to place on the tube if it breaks. Do not use scissors or sharp objects near the tube. You may bend your arm and move it freely. If your PICC is near or at the bend of your elbow, avoid activity with repeated motion at the elbow. Avoid lifting heavy objects as told by your health care provider. Keep all follow-up visits as told by your health care provider. This is important. Disposal of supplies Throw away any syringes in a disposal container that is meant for sharp items (sharps container). You can buy a sharps container from a pharmacy, or you can make one by using an empty hard plastic bottle with a cover. Place any used dressings or infusion bags into a plastic bag. Throw that bag in the trash. Contact a health care provider if: You have pain in your arm, ear, face, or teeth. You have a fever or chills. You have redness, swelling, or pain around the insertion site. You have fluid or blood coming from the insertion site. Your insertion site feels warm to the touch. You have pus or a bad smell coming from the insertion site. Your skin feels hard and raised around the insertion site. Get help right away if: Your PICC is accidentally pulled all the way out. If this happens, cover the insertion site with a bandage or gauze dressing. Do not throw the PICC away. Your health care provider will need to check it. Your PICC was tugged or pulled and has partially come out. Do not  push the PICC back in. You cannot flush the PICC, it is hard to flush, or the PICC leaks around the insertion site when it is flushed. You hear a "flushing" sound when the PICC is flushed. You feel your heart racing or skipping beats. There is a hole or tear in the PICC. You have swelling in the arm in which the PICC  was inserted. You have a red streak going up your arm from where the PICC was inserted. Summary A peripherally inserted central catheter (PICC) is a long, thin, flexible tube (catheter) that is inserted into a vein in the upper arm. The PICC is inserted using a sterile technique by a specially trained nurse or physician. Only a trained clinical professional should remove it. Keep your PICC identification card with you at all times. Avoid blood pressure checks on the arm in which the PICC is placed. If cared for properly, a PICC can remain in place for several months. Having a PICC can also allow a person to go home from the hospital sooner. This information is not intended to replace advice given to you by your health care provider. Make sure you discuss any questions you have with your health care provider. Document Revised: 05/07/2019 Document Reviewed: 05/11/2019 Elsevier Patient Education  2022  [employer].

## 2020-11-14 NOTE — Progress Notes (Signed)
Patient presents for treatment. RN assessment completed along with the following:  Labs/vitals reviewed - Yes, and within treatment parameters.   Weight within 10% of previous measurement - Yes Oncology Treatment Attestation completed for current therapy- Yes, on date 09/07/20 Informed consent completed and reflects current therapy/intent - Yes, on date 08/19/20             Provider progress note reviewed - Yes, today's provider note was reviewed. Treatment/Antibody/Supportive plan reviewed -  Yes, and per Ned Card, NP, dose adjustments are being made for 5 FU. Ellen Henri has also been d/c'd on patient's pump disconnect day.  S&H and other orders reviewed - Yes, and there are no additional orders identified. Previous treatment date reviewed - Yes, and the appropriate amount of time has elapsed between treatments. Clinic Hand Off Received from - Ned Card, NP  Patient to proceed with treatment.

## 2020-11-14 NOTE — Patient Instructions (Addendum)

## 2020-11-14 NOTE — Progress Notes (Signed)
[place] OFFICE PROGRESS NOTE   Diagnosis: Colon cancer  INTERVAL HISTORY:   [name] returns as scheduled.  She completed cycle 4 FOLFIRI/Panitumumab 10/31/2020.  She has periodic nausea, occasional vomiting.  She tends to develop mouth sores a few days after each treatment with associated mouth discomfort, sore throat.  She has intermittent diarrhea.  Skin rash is stable, mainly on the face.  She continues doxycycline.  She is utilizing hydrocortisone cream.  Objective:  Vital signs in last 24 hours:  Blood pressure 105/83, pulse 96, temperature (!) 97.2 F (36.2 C), temperature source Oral, resp. rate 18, height '5\' 7"'  (1.702 m), weight 162 lb (73.5 kg), last menstrual period 01/07/2006, SpO2 100 %.    HEENT: No thrush or ulcers. Resp: Lungs clear bilaterally. Cardio: Regular rate and rhythm. GI: Abdomen soft and nontender.  No hepatosplenomegaly. Vascular: No leg edema. Skin: Acne type rash forehead, nose, chin. Right upper extremity PICC without erythema.  Lab Results:  Lab Results  Component Value Date   WBC 9.1 11/14/2020   HGB 11.8 (L) 11/14/2020   HCT 36.2 11/14/2020   MCV 95.5 11/14/2020   PLT 292 11/14/2020   NEUTROABS 5.4 11/14/2020    Imaging:  No results found.  Medications: I have reviewed the patient's current medications.  Assessment/Plan: Sigmoid colon cancer, stage IV (pT4a,pN2b,M1c) Colonoscopy 03/24/2019-3 rectal polyps-hyperplastic polyps, distal colon biopsy-at least intramucosal adenocarcinoma, completely obstructing mass in the distal sigmoid colon, could not be traversed, intact mismatch repair protein expression 03/24/2019-CEA 56.1 03/29/2019 CT abdomen/pelvis-circumferential thickening involving the entire mid and distal sigmoid colon to the level of the rectosigmoid junction, solid/cystic mass in the left ovary, bilateral nephrolithiasis Robotic assisted low anterior resection, mesenteric lymphadenectomy, bilateral  salpingo-oophorectomy 04/08/2019 Pathology ([facility] review of outside pathology) metastatic adenocarcinoma involving the peritoneum overlying the round ligament, serosa of the urinary bladder, serosa of the right ureter a sacral area, pelvic peritoneum, and left ovary.  Omental biopsy with focal mucin pools with no carcinoma cells identified, right hemidiaphragm biopsy involved by metastatic adenocarcinoma, invasive adenocarcinoma the sigmoid colon, moderately differentiated, T4a, perineural and vascular invasion present, 7/22 lymph nodes, multiple tumor deposits, resection margins negative Negative for PD-L1, low probability of MSI-high, HER-2 negative, negative for BRAF, NRAS and KRAS alterations CTs 05/19/2019-no evidence of metastatic disease, findings suspicious for colitis of the transverse and ascending colon, small amount of ascites in the cul-de-sac, bilateral renal calculi Cycle 1 FOLFIRI 05/24/2019, bevacizumab added with cycle 2 Cycle 4 FOLFIRI/bevacizumab 07/05/2019 Cycle 5 FOLFIRI 07/27/2019, bevacizumab held secondary to hypertension Cycle 6 FOLFIRI 08/10/1999, bevacizumab held secondary to hypertension CTs 08/30/2019-no evidence of recurrent disease, new subsolid right lower lobe nodule felt to be inflammatory, emphysema Cycle 7 FOLFIRI 09/26/2019, bevacizumab remains on hold secondary to hypertension Cycle 8 FOLFIRI 10/17/2019, bevacizumab held secondary to hypertension, Udenyca added for neutropenia Cycle 9 FOLFIRI 11/07/2019, bevacizumab held secondary to hypertension, Udenyca Cycle 10 FOLFIRI 11/28/2019, bevacizumab held, Udenyca Cycle 11 FOLFIRI 12/26/2019, bevacizumab held, Udenyca CTs 01/25/2020-no evidence of recurrent disease, resolution of right lower lobe nodule Maintenance Xeloda beginning 02/06/2020 CTs 04/25/2020- no evidence of metastatic disease, multiple bilateral renal calculi without hydronephrosis Maintenance Xeloda continued CT abdomen/pelvis without contrast  08/10/2020-bilateral staghorn renal calculi, no evidence of metastatic disease; addendum 08/23/2020-small but increasing omental nodules. Cycle 1 FOLFIRI/panitumumab 09/19/2020 Cycle 2 FOLFIRI/Panitumumab 10/03/2020 Cycle 3 FOLFIRI/Panitumumab 10/17/2020 Cycle 4 FOLFIRI/panitumumab 10/31/2020 Cycle 5 FOLFIRI/Panitumumab 11/14/2020   Hypertension G4 P3, twins Kidney stones-bilateral staghorn renal calculi on CT 08/10/2020 Infected Port-A-Cath 09/12/2019-placed  on Augmentin, referred for Port-A-Cath removal; Port-A-Cath removed 09/14/2019; PICC line placed 09/14/2019; culture staph aureus.  Course of Septra completed. Neutropenia secondary to chemotherapy-Udenyca added with cycle 8 FOLFIRI Right nephrostomy tube 09/12/2020; stent placed 10/09/2020, percutaneous nephrostolithotomy treatment of right-sided kidney stones 10/09/2020, malpositioned right ureter stent replacement 10/23/2020, right ureter stent removed 10/29/2020    Disposition: [name] appears stable.  She has completed 4 cycles of FOLFIRI/Panitumumab.  Plan to proceed with cycle 5 today as scheduled.  5-FU bolus and pump will be dose reduced due to mucositis.  Holding [place] with this cycle due to report of left upper quadrant pain after the last injection.  Plan for CT scans prior to next office visit.  CBC and chemistry panel reviewed.  Labs adequate to proceed with treatment.  She has persistent hypokalemia.  She will increase the potassium supplement to twice daily for 3 days.  She will return for follow-up in 2 weeks, CT scans a few days prior.  She will contact the office in the interim with any problems.  We specifically discussed fever, chills, other signs of infection.  Plan reviewed with Dr. Benay Spice.    Ned Card ANP/GNP-BC   11/14/2020  9:30 AM

## 2020-11-16 ENCOUNTER — Other Ambulatory Visit: Payer: Self-pay

## 2020-11-16 ENCOUNTER — Inpatient Hospital Stay: Payer: Medicaid Other

## 2020-11-16 VITALS — BP 133/91 | HR 79 | Temp 98.3°F | Resp 18

## 2020-11-16 DIAGNOSIS — C19 Malignant neoplasm of rectosigmoid junction: Secondary | ICD-10-CM

## 2020-11-16 DIAGNOSIS — Z5111 Encounter for antineoplastic chemotherapy: Secondary | ICD-10-CM | POA: Diagnosis not present

## 2020-11-16 MED ORDER — HEPARIN SOD (PORK) LOCK FLUSH 100 UNIT/ML IV SOLN
250.0000 [IU] | Freq: Once | INTRAVENOUS | Status: AC | PRN
  Administered 2020-11-16: 250 [IU]

## 2020-11-16 MED ORDER — SODIUM CHLORIDE 0.9% FLUSH
10.0000 mL | INTRAVENOUS | Status: DC | PRN
  Administered 2020-11-16: 10 mL

## 2020-11-16 NOTE — Patient Instructions (Signed)
PICC Home Care Guide A peripherally inserted central catheter (PICC) is a form of IV access that allows medicines and IV fluids to be quickly put into the blood and spread throughout the body. The PICC is a long, thin, flexible tube (catheter) that is put into a vein in a person's arm or leg. The catheter ends in a large vein just outside the heart called the superior vena cava (SVC). After the PICC is put in, a chest X-ray may be done to make sure that it is in the right place. A PICC may be placed for different reasons, such as: To give medicines and liquid nutrition. To give IV fluids and blood products. To take blood samples often. If there is trouble placing a peripheral intravenous (PIV) catheter. If cared for properly, a PICC can remain in place for many months. Having a PICC can allow you to go home from the hospital sooner and continue treatment at home. Medicines and PICC care can be managed at home by a family member, caregiver, or home health care team. What are the risks? Generally, having a PICC is safe. However, problems may occur, including: A blood clot (thrombus) forming in or at the end of the PICC. A blood clot forming in a vein (deep vein thrombosis) or traveling to the lung (pulmonary embolism). Inflammation of the vein (phlebitis) in which the PICC is placed. Infection at the insertion site or in the blood. Blood infections from central lines, like PICCs, can be serious and often require a hospital stay. PICC malposition, or PICC movement or poor placement. A break or cut in the PICC. Do not use scissors near the PICC. Nerve or tendon irritation or injury during PICC insertion. How to care for your PICC Please follow the specific guidelines provided by your health care provider. Preventing infection You and any caregivers should wash your hands often with soap and water for at least 20 seconds. Wash hands: Before touching the PICC or the infusion device. Before changing a  bandage (dressing). Do not change the dressing unless you have been taught to do so and have shown you are able to change it safely. Flush the PICC as told. Tell your health care provider right away if the PICC is hard to flush or does not flush. Do not use force to flush the PICC. Use clean and germ-free (sterile) supplies only. Keep the supplies in a dry place. Do not reuse needles, syringes, or any other supplies. Reusing supplies can lead to infection. Keep the PICC dressing dry and secure it with tape if the edges stop sticking to your skin. Check your PICC insertion site every day for signs of infection. Check for: Redness, swelling, or pain. Fluid or blood. Warmth. Pus or a bad smell. Preventing other problems Do not use a syringe that is less than 10 mL to flush the PICC. Do not have your blood pressure checked on the arm in which the PICC is placed. Do not ever pull or tug on the PICC. Keep it secured to your arm with tape or a stretch wrap when not in use. Do not take the PICC out yourself. Only a trained health care provider should remove the PICC. Keep pets and children away from your PICC. How to care for your PICC dressing Keep your PICC dressing clean and dry to prevent infection. Do not take baths, swim, or use a hot tub until your health care provider approves. Ask your health care provider if you can take   showers. You may only be allowed to take sponge baths. When you are allowed to shower: Ask your health care provider to teach you how to wrap the PICC. Cover the PICC with clear plastic wrap and tape to keep it dry while showering. Follow instructions from your health care provider about how to take care of your insertion site and dressing. Make sure you: Wash your hands with soap and water for at least 20 seconds before and after you change your dressing. If soap and water are not available, use hand sanitizer. Change your dressing only if taught to do so by your health care  provider. Your PICC dressing needs to be changed if it becomes loose or wet. Leave stitches (sutures), skin glue, or adhesive strips in place. These skin closures may need to stay in place for 2 weeks or longer. If adhesive strip edges start to loosen and curl up, you may trim the loose edges. Do not remove adhesive strips completely unless your health care provider tells you to do that. Follow these instructions at home: Disposal of supplies Throw away any syringes in a disposal container that is meant for sharp items (sharps container). You can buy a sharps container from a pharmacy, or you can make one by using an empty, hard plastic bottle with a lid. Place any used dressings or infusion bags into a plastic bag. Throw that bag in the trash. General instructions  Always carry your PICC identification card or wear a medical alert bracelet. Keep the tube clamped at all times, unless it is being used. Always carry a smooth-edge clamp with you to clamp the PICC if it breaks. Do not use scissors or sharp objects near the tube. You may bend your arm and move it freely. If your PICC is near or at the bend of your elbow, avoid activity with repeated motion at the elbow. Avoid lifting heavy objects as told by your health care provider. Keep all follow-up visits. This is important. You will need to have your PICC dressing changed at least once a week. Contact a health care provider if: You have pain in your arm, ear, face, or teeth. You have a fever or chills. You have redness, swelling, or pain around the insertion site. You have fluid or blood coming from the insertion site. Your insertion site feels warm to the touch. You have pus or a bad smell coming from the insertion site. Your skin feels hard and raised around the insertion site. Your PICC dressing has gotten wet or is coming off and you have not been taught how to change it. Get help right away if: You have problems with your PICC, such as  your PICC: Was tugged or pulled and has partially come out. Do not  push the PICC back in. Cannot be flushed, is hard to flush, or leaks around the insertion site when it is flushed. Makes a flushing sound when it is flushed. Appears to have a hole or tear. Is accidentally pulled all the way out. If this happens, cover the insertion site with a gauze dressing. Do not throw the PICC away. Your health care provider will need to check it to be sure the entire catheter came out. You feel your heart racing or skipping beats, or you have chest pain. You have shortness of breath or trouble breathing. You have swelling, redness, warmth, or pain in the arm in which the PICC is placed. You have a red streak going up your arm that   starts under the PICC dressing. These symptoms may be an emergency. Get help right away. Call 911. Do not wait to see if the symptoms will go away. Do not drive yourself to the hospital. Summary A peripherally inserted central catheter (PICC) is a long, thin, flexible tube (catheter) that is put into a vein in the arm or leg. If cared for properly, a PICC can remain in place for many months. Having a PICC can allow you to go home from the hospital sooner and continue treatment at home. The PICC is inserted using a germ-free (sterile) technique by a specially trained health care provider. Only a trained health care provider should remove it. Do not have your blood pressure checked on the arm in which your PICC is placed. Always keep your PICC identification card with you. This information is not intended to replace advice given to you by your health care provider. Make sure you discuss any questions you have with your health care provider. Document Revised: 07/18/2020 Document Reviewed: 07/18/2020 Elsevier Patient Education  2022 [employer]  

## 2020-11-21 ENCOUNTER — Other Ambulatory Visit: Payer: Self-pay

## 2020-11-21 ENCOUNTER — Inpatient Hospital Stay: Payer: Medicaid Other

## 2020-11-21 VITALS — BP 124/93 | HR 91 | Temp 97.5°F | Resp 18

## 2020-11-21 DIAGNOSIS — Z5111 Encounter for antineoplastic chemotherapy: Secondary | ICD-10-CM | POA: Diagnosis not present

## 2020-11-21 DIAGNOSIS — Z95828 Presence of other vascular implants and grafts: Secondary | ICD-10-CM

## 2020-11-21 DIAGNOSIS — C19 Malignant neoplasm of rectosigmoid junction: Secondary | ICD-10-CM

## 2020-11-21 MED ORDER — SODIUM CHLORIDE 0.9% FLUSH
10.0000 mL | Freq: Once | INTRAVENOUS | Status: AC
  Administered 2020-11-21: 10 mL

## 2020-11-21 MED ORDER — HEPARIN SOD (PORK) LOCK FLUSH 100 UNIT/ML IV SOLN
500.0000 [IU] | Freq: Once | INTRAVENOUS | Status: AC
  Administered 2020-11-21: 500 [IU]

## 2020-11-21 NOTE — Patient Instructions (Signed)
PICC Home Care Guide A peripherally inserted central catheter (PICC) is a form of IV access that allows medicines and IV fluids to be quickly put into the blood and spread throughout the body. The PICC is a long, thin, flexible tube (catheter) that is put into a vein in a person's arm or leg. The catheter ends in a large vein just outside the heart called the superior vena cava (SVC). After the PICC is put in, a chest X-ray may be done to make sure that it is in the right place. A PICC may be placed for different reasons, such as: To give medicines and liquid nutrition. To give IV fluids and blood products. To take blood samples often. If there is trouble placing a peripheral intravenous (PIV) catheter. If cared for properly, a PICC can remain in place for many months. Having a PICC can allow you to go home from the hospital sooner and continue treatment at home. Medicines and PICC care can be managed at home by a family member, caregiver, or home health care team. What are the risks? Generally, having a PICC is safe. However, problems may occur, including: A blood clot (thrombus) forming in or at the end of the PICC. A blood clot forming in a vein (deep vein thrombosis) or traveling to the lung (pulmonary embolism). Inflammation of the vein (phlebitis) in which the PICC is placed. Infection at the insertion site or in the blood. Blood infections from central lines, like PICCs, can be serious and often require a hospital stay. PICC malposition, or PICC movement or poor placement. A break or cut in the PICC. Do not use scissors near the PICC. Nerve or tendon irritation or injury during PICC insertion. How to care for your PICC Please follow the specific guidelines provided by your health care provider. Preventing infection You and any caregivers should wash your hands often with soap and water for at least 20 seconds. Wash hands: Before touching the PICC or the infusion device. Before changing a  bandage (dressing). Do not change the dressing unless you have been taught to do so and have shown you are able to change it safely. Flush the PICC as told. Tell your health care provider right away if the PICC is hard to flush or does not flush. Do not use force to flush the PICC. Use clean and germ-free (sterile) supplies only. Keep the supplies in a dry place. Do not reuse needles, syringes, or any other supplies. Reusing supplies can lead to infection. Keep the PICC dressing dry and secure it with tape if the edges stop sticking to your skin. Check your PICC insertion site every day for signs of infection. Check for: Redness, swelling, or pain. Fluid or blood. Warmth. Pus or a bad smell. Preventing other problems Do not use a syringe that is less than 10 mL to flush the PICC. Do not have your blood pressure checked on the arm in which the PICC is placed. Do not ever pull or tug on the PICC. Keep it secured to your arm with tape or a stretch wrap when not in use. Do not take the PICC out yourself. Only a trained health care provider should remove the PICC. Keep pets and children away from your PICC. How to care for your PICC dressing Keep your PICC dressing clean and dry to prevent infection. Do not take baths, swim, or use a hot tub until your health care provider approves. Ask your health care provider if you can take   showers. You may only be allowed to take sponge baths. When you are allowed to shower: Ask your health care provider to teach you how to wrap the PICC. Cover the PICC with clear plastic wrap and tape to keep it dry while showering. Follow instructions from your health care provider about how to take care of your insertion site and dressing. Make sure you: Wash your hands with soap and water for at least 20 seconds before and after you change your dressing. If soap and water are not available, use hand sanitizer. Change your dressing only if taught to do so by your health care  provider. Your PICC dressing needs to be changed if it becomes loose or wet. Leave stitches (sutures), skin glue, or adhesive strips in place. These skin closures may need to stay in place for 2 weeks or longer. If adhesive strip edges start to loosen and curl up, you may trim the loose edges. Do not remove adhesive strips completely unless your health care provider tells you to do that. Follow these instructions at home: Disposal of supplies Throw away any syringes in a disposal container that is meant for sharp items (sharps container). You can buy a sharps container from a pharmacy, or you can make one by using an empty, hard plastic bottle with a lid. Place any used dressings or infusion bags into a plastic bag. Throw that bag in the trash. General instructions  Always carry your PICC identification card or wear a medical alert bracelet. Keep the tube clamped at all times, unless it is being used. Always carry a smooth-edge clamp with you to clamp the PICC if it breaks. Do not use scissors or sharp objects near the tube. You may bend your arm and move it freely. If your PICC is near or at the bend of your elbow, avoid activity with repeated motion at the elbow. Avoid lifting heavy objects as told by your health care provider. Keep all follow-up visits. This is important. You will need to have your PICC dressing changed at least once a week. Contact a health care provider if: You have pain in your arm, ear, face, or teeth. You have a fever or chills. You have redness, swelling, or pain around the insertion site. You have fluid or blood coming from the insertion site. Your insertion site feels warm to the touch. You have pus or a bad smell coming from the insertion site. Your skin feels hard and raised around the insertion site. Your PICC dressing has gotten wet or is coming off and you have not been taught how to change it. Get help right away if: You have problems with your PICC, such as  your PICC: Was tugged or pulled and has partially come out. Do not  push the PICC back in. Cannot be flushed, is hard to flush, or leaks around the insertion site when it is flushed. Makes a flushing sound when it is flushed. Appears to have a hole or tear. Is accidentally pulled all the way out. If this happens, cover the insertion site with a gauze dressing. Do not throw the PICC away. Your health care provider will need to check it to be sure the entire catheter came out. You feel your heart racing or skipping beats, or you have chest pain. You have shortness of breath or trouble breathing. You have swelling, redness, warmth, or pain in the arm in which the PICC is placed. You have a red streak going up your arm that   starts under the PICC dressing. These symptoms may be an emergency. Get help right away. Call 911. Do not wait to see if the symptoms will go away. Do not drive yourself to the hospital. Summary A peripherally inserted central catheter (PICC) is a long, thin, flexible tube (catheter) that is put into a vein in the arm or leg. If cared for properly, a PICC can remain in place for many months. Having a PICC can allow you to go home from the hospital sooner and continue treatment at home. The PICC is inserted using a germ-free (sterile) technique by a specially trained health care provider. Only a trained health care provider should remove it. Do not have your blood pressure checked on the arm in which your PICC is placed. Always keep your PICC identification card with you. This information is not intended to replace advice given to you by your health care provider. Make sure you discuss any questions you have with your health care provider. Document Revised: 07/18/2020 Document Reviewed: 07/18/2020 Elsevier Patient Education  2022 [employer]  

## 2020-11-25 ENCOUNTER — Other Ambulatory Visit: Payer: Self-pay | Admitting: Oncology

## 2020-11-26 ENCOUNTER — Encounter (HOSPITAL_BASED_OUTPATIENT_CLINIC_OR_DEPARTMENT_OTHER): Payer: Self-pay

## 2020-11-26 ENCOUNTER — Ambulatory Visit (HOSPITAL_BASED_OUTPATIENT_CLINIC_OR_DEPARTMENT_OTHER)
Admission: RE | Admit: 2020-11-26 | Discharge: 2020-11-26 | Disposition: A | Discharge status: Home / Self Care | Payer: Medicaid Other | Source: Ambulatory Visit | Attending: Nurse Practitioner | Admitting: Nurse Practitioner

## 2020-11-26 ENCOUNTER — Other Ambulatory Visit: Payer: Self-pay

## 2020-11-26 DIAGNOSIS — C19 Malignant neoplasm of rectosigmoid junction: Secondary | ICD-10-CM | POA: Diagnosis not present

## 2020-11-26 MED ORDER — HEPARIN SOD (PORK) LOCK FLUSH 100 UNIT/ML IV SOLN
500.0000 [IU] | Freq: Once | INTRAVENOUS | Status: AC
  Administered 2020-11-26: 250 [IU] via INTRAVENOUS

## 2020-11-26 MED ORDER — IOHEXOL 300 MG/ML  SOLN
85.0000 mL | Freq: Once | INTRAMUSCULAR | Status: AC | PRN
  Administered 2020-11-26: 85 mL via INTRAVENOUS

## 2020-11-28 ENCOUNTER — Inpatient Hospital Stay (HOSPITAL_BASED_OUTPATIENT_CLINIC_OR_DEPARTMENT_OTHER): Payer: Medicaid Other | Admitting: Nurse Practitioner

## 2020-11-28 ENCOUNTER — Inpatient Hospital Stay: Payer: Medicaid Other

## 2020-11-28 ENCOUNTER — Other Ambulatory Visit: Payer: Self-pay | Admitting: Oncology

## 2020-11-28 ENCOUNTER — Encounter: Payer: Self-pay | Admitting: Nurse Practitioner

## 2020-11-28 ENCOUNTER — Other Ambulatory Visit: Payer: Self-pay

## 2020-11-28 VITALS — BP 140/94 | HR 81 | Temp 97.8°F | Resp 18 | Ht 67.0 in | Wt 160.8 lb

## 2020-11-28 DIAGNOSIS — C19 Malignant neoplasm of rectosigmoid junction: Secondary | ICD-10-CM

## 2020-11-28 DIAGNOSIS — Z5111 Encounter for antineoplastic chemotherapy: Secondary | ICD-10-CM | POA: Diagnosis not present

## 2020-11-28 LAB — CBC WITH DIFFERENTIAL (CANCER CENTER ONLY)
Abs Immature Granulocytes: 0.01 10*3/uL (ref 0.00–0.07)
Basophils Absolute: 0 10*3/uL (ref 0.0–0.1)
Basophils Relative: 1 %
Eosinophils Absolute: 0.3 10*3/uL (ref 0.0–0.5)
Eosinophils Relative: 5 %
HCT: 35.4 % — ABNORMAL LOW (ref 36.0–46.0)
Hemoglobin: 11.6 g/dL — ABNORMAL LOW (ref 12.0–15.0)
Immature Granulocytes: 0 %
Lymphocytes Relative: 40 %
Lymphs Abs: 2.2 10*3/uL (ref 0.7–4.0)
MCH: 31.4 pg (ref 26.0–34.0)
MCHC: 32.8 g/dL (ref 30.0–36.0)
MCV: 95.9 fL (ref 80.0–100.0)
Monocytes Absolute: 0.8 10*3/uL (ref 0.1–1.0)
Monocytes Relative: 14 %
Neutro Abs: 2.2 10*3/uL (ref 1.7–7.7)
Neutrophils Relative %: 40 %
Platelet Count: 304 10*3/uL (ref 150–400)
RBC: 3.69 MIL/uL — ABNORMAL LOW (ref 3.87–5.11)
RDW: 14.7 % (ref 11.5–15.5)
WBC Count: 5.4 10*3/uL (ref 4.0–10.5)
nRBC: 0 % (ref 0.0–0.2)

## 2020-11-28 LAB — CMP (CANCER CENTER ONLY)
ALT: 34 U/L (ref 0–44)
AST: 26 U/L (ref 15–41)
Albumin: 3.9 g/dL (ref 3.5–5.0)
Alkaline Phosphatase: 127 U/L — ABNORMAL HIGH (ref 38–126)
Anion gap: 7 (ref 5–15)
BUN: 6 mg/dL (ref 6–20)
CO2: 29 mmol/L (ref 22–32)
Calcium: 9.3 mg/dL (ref 8.9–10.3)
Chloride: 104 mmol/L (ref 98–111)
Creatinine: 0.55 mg/dL (ref 0.44–1.00)
GFR, Estimated: >60 mL/min (ref >60)
Glucose, Bld: 95 mg/dL (ref 70–99)
Potassium: 3.3 mmol/L — ABNORMAL LOW (ref 3.5–5.1)
Sodium: 140 mmol/L (ref 135–145)
Total Bilirubin: 0.2 mg/dL — ABNORMAL LOW (ref 0.3–1.2)
Total Protein: 7 g/dL (ref 6.5–8.1)

## 2020-11-28 LAB — MAGNESIUM: Magnesium: 1.8 mg/dL (ref 1.7–2.4)

## 2020-11-28 MED ORDER — ATROPINE SULFATE 1 MG/ML IV SOLN
0.5000 mg | Freq: Once | INTRAVENOUS | Status: AC
  Administered 2020-11-28: 0.5 mg via INTRAVENOUS
  Filled 2020-11-28: qty 1

## 2020-11-28 MED ORDER — SODIUM CHLORIDE 0.9 % IV SOLN
150.0000 mg | Freq: Once | INTRAVENOUS | Status: AC
  Administered 2020-11-28: 150 mg via INTRAVENOUS
  Filled 2020-11-28: qty 5

## 2020-11-28 MED ORDER — SODIUM CHLORIDE 0.9 % IV SOLN
1800.0000 mg/m2 | INTRAVENOUS | Status: DC
  Administered 2020-11-28: 3350 mg via INTRAVENOUS
  Filled 2020-11-28: qty 67

## 2020-11-28 MED ORDER — PALONOSETRON HCL INJECTION 0.25 MG/5ML
0.2500 mg | Freq: Once | INTRAVENOUS | Status: AC
  Administered 2020-11-28: 0.25 mg via INTRAVENOUS
  Filled 2020-11-28: qty 5

## 2020-11-28 MED ORDER — SODIUM CHLORIDE 0.9 % IV SOLN
200.0000 mg/m2 | Freq: Once | INTRAVENOUS | Status: AC
  Administered 2020-11-28: 372 mg via INTRAVENOUS
  Filled 2020-11-28: qty 18.6

## 2020-11-28 MED ORDER — SODIUM CHLORIDE 0.9 % IV SOLN
125.0000 mg/m2 | Freq: Once | INTRAVENOUS | Status: AC
  Administered 2020-11-28: 240 mg via INTRAVENOUS
  Filled 2020-11-28: qty 12

## 2020-11-28 MED ORDER — FLUOROURACIL CHEMO INJECTION 500 MG/10ML
200.0000 mg/m2 | Freq: Once | INTRAVENOUS | Status: AC
  Administered 2020-11-28: 350 mg via INTRAVENOUS
  Filled 2020-11-28: qty 7

## 2020-11-28 MED ORDER — SODIUM CHLORIDE 0.9 % IV SOLN
6.0000 mg/kg | Freq: Once | INTRAVENOUS | Status: AC
  Administered 2020-11-28: 440 mg via INTRAVENOUS
  Filled 2020-11-28: qty 20

## 2020-11-28 MED ORDER — SODIUM CHLORIDE 0.9 % IV SOLN: Freq: Once | INTRAVENOUS | Status: AC

## 2020-11-28 MED ORDER — DEXAMETHASONE 4 MG PO TABS: 4.0000 mg | ORAL_TABLET | Freq: Two times a day (BID) | ORAL | 1 refills | Status: DC

## 2020-11-28 MED ORDER — SODIUM CHLORIDE 0.9 % IV SOLN
10.0000 mg | Freq: Once | INTRAVENOUS | Status: AC
  Administered 2020-11-28: 10 mg via INTRAVENOUS
  Filled 2020-11-28: qty 1

## 2020-11-28 NOTE — Patient Instructions (Addendum)

## 2020-11-28 NOTE — Patient Instructions (Signed)
PICC Home Care Guide A peripherally inserted central catheter (PICC) is a form of IV access that allows medicines and IV fluids to be quickly put into the blood and spread throughout the body. The PICC is a long, thin, flexible tube (catheter) that is put into a vein in a person's arm or leg. The catheter ends in a large vein just outside the heart called the superior vena cava (SVC). After the PICC is put in, a chest X-ray may be done to make sure that it is in the right place. A PICC may be placed for different reasons, such as: To give medicines and liquid nutrition. To give IV fluids and blood products. To take blood samples often. If there is trouble placing a peripheral intravenous (PIV) catheter. If cared for properly, a PICC can remain in place for many months. Having a PICC can allow you to go home from the hospital sooner and continue treatment at home. Medicines and PICC care can be managed at home by a family member, caregiver, or home health care team. What are the risks? Generally, having a PICC is safe. However, problems may occur, including: A blood clot (thrombus) forming in or at the end of the PICC. A blood clot forming in a vein (deep vein thrombosis) or traveling to the lung (pulmonary embolism). Inflammation of the vein (phlebitis) in which the PICC is placed. Infection at the insertion site or in the blood. Blood infections from central lines, like PICCs, can be serious and often require a hospital stay. PICC malposition, or PICC movement or poor placement. A break or cut in the PICC. Do not use scissors near the PICC. Nerve or tendon irritation or injury during PICC insertion. How to care for your PICC Please follow the specific guidelines provided by your health care provider. Preventing infection You and any caregivers should wash your hands often with soap and water for at least 20 seconds. Wash hands: Before touching the PICC or the infusion device. Before changing a  bandage (dressing). Do not change the dressing unless you have been taught to do so and have shown you are able to change it safely. Flush the PICC as told. Tell your health care provider right away if the PICC is hard to flush or does not flush. Do not use force to flush the PICC. Use clean and germ-free (sterile) supplies only. Keep the supplies in a dry place. Do not reuse needles, syringes, or any other supplies. Reusing supplies can lead to infection. Keep the PICC dressing dry and secure it with tape if the edges stop sticking to your skin. Check your PICC insertion site every day for signs of infection. Check for: Redness, swelling, or pain. Fluid or blood. Warmth. Pus or a bad smell. Preventing other problems Do not use a syringe that is less than 10 mL to flush the PICC. Do not have your blood pressure checked on the arm in which the PICC is placed. Do not ever pull or tug on the PICC. Keep it secured to your arm with tape or a stretch wrap when not in use. Do not take the PICC out yourself. Only a trained health care provider should remove the PICC. Keep pets and children away from your PICC. How to care for your PICC dressing Keep your PICC dressing clean and dry to prevent infection. Do not take baths, swim, or use a hot tub until your health care provider approves. Ask your health care provider if you can take   showers. You may only be allowed to take sponge baths. When you are allowed to shower: Ask your health care provider to teach you how to wrap the PICC. Cover the PICC with clear plastic wrap and tape to keep it dry while showering. Follow instructions from your health care provider about how to take care of your insertion site and dressing. Make sure you: Wash your hands with soap and water for at least 20 seconds before and after you change your dressing. If soap and water are not available, use hand sanitizer. Change your dressing only if taught to do so by your health care  provider. Your PICC dressing needs to be changed if it becomes loose or wet. Leave stitches (sutures), skin glue, or adhesive strips in place. These skin closures may need to stay in place for 2 weeks or longer. If adhesive strip edges start to loosen and curl up, you may trim the loose edges. Do not remove adhesive strips completely unless your health care provider tells you to do that. Follow these instructions at home: Disposal of supplies Throw away any syringes in a disposal container that is meant for sharp items (sharps container). You can buy a sharps container from a pharmacy, or you can make one by using an empty, hard plastic bottle with a lid. Place any used dressings or infusion bags into a plastic bag. Throw that bag in the trash. General instructions  Always carry your PICC identification card or wear a medical alert bracelet. Keep the tube clamped at all times, unless it is being used. Always carry a smooth-edge clamp with you to clamp the PICC if it breaks. Do not use scissors or sharp objects near the tube. You may bend your arm and move it freely. If your PICC is near or at the bend of your elbow, avoid activity with repeated motion at the elbow. Avoid lifting heavy objects as told by your health care provider. Keep all follow-up visits. This is important. You will need to have your PICC dressing changed at least once a week. Contact a health care provider if: You have pain in your arm, ear, face, or teeth. You have a fever or chills. You have redness, swelling, or pain around the insertion site. You have fluid or blood coming from the insertion site. Your insertion site feels warm to the touch. You have pus or a bad smell coming from the insertion site. Your skin feels hard and raised around the insertion site. Your PICC dressing has gotten wet or is coming off and you have not been taught how to change it. Get help right away if: You have problems with your PICC, such as  your PICC: Was tugged or pulled and has partially come out. Do not  push the PICC back in. Cannot be flushed, is hard to flush, or leaks around the insertion site when it is flushed. Makes a flushing sound when it is flushed. Appears to have a hole or tear. Is accidentally pulled all the way out. If this happens, cover the insertion site with a gauze dressing. Do not throw the PICC away. Your health care provider will need to check it to be sure the entire catheter came out. You feel your heart racing or skipping beats, or you have chest pain. You have shortness of breath or trouble breathing. You have swelling, redness, warmth, or pain in the arm in which the PICC is placed. You have a red streak going up your arm that   starts under the PICC dressing. These symptoms may be an emergency. Get help right away. Call 911. Do not wait to see if the symptoms will go away. Do not drive yourself to the hospital. Summary A peripherally inserted central catheter (PICC) is a long, thin, flexible tube (catheter) that is put into a vein in the arm or leg. If cared for properly, a PICC can remain in place for many months. Having a PICC can allow you to go home from the hospital sooner and continue treatment at home. The PICC is inserted using a germ-free (sterile) technique by a specially trained health care provider. Only a trained health care provider should remove it. Do not have your blood pressure checked on the arm in which your PICC is placed. Always keep your PICC identification card with you. This information is not intended to replace advice given to you by your health care provider. Make sure you discuss any questions you have with your health care provider. Document Revised: 07/18/2020 Document Reviewed: 07/18/2020 Elsevier Patient Education  2022 [employer]  

## 2020-11-28 NOTE — Progress Notes (Signed)
Patient presents for treatment. RN assessment completed along with the following:  Labs/vitals reviewed - Yes, and within treatment parameters.   Weight within 10% of previous measurement - Yes Oncology Treatment Attestation completed for current therapy- Yes, on date 09/07/20 Informed consent completed and reflects current therapy/intent - Yes, on date 08/19/20             Provider progress note reviewed - Yes.  Treatment/Antibody/Supportive plan reviewed - Yes, and Per Merceda Elks, Irinotecan to be dose reduced.  S&H and other orders reviewed - Yes, and there are no additional orders identified. Previous treatment date reviewed - Yes, and the appropriate amount of time has elapsed between treatments. Clinic Hand Off Received from - Merceda Elks, RN  Ok to start premeds with pending irinotecan dose reduction and pending Mg lab. Per Roselind Messier, Pharmacist, ok to give irinotecan/leucovorin first, vectibix second.  Patient to proceed with treatment.

## 2020-11-28 NOTE — Progress Notes (Signed)
Patient seen by Ned Card NP today  Vitals are within treatment parameters.  Labs reviewed by Ned Card NP and are within treatment parameters.  Per physician team, patient is ready for treatment. Please note that modifications are being made to the treatment plan including MD will dose reduce irinotecan today.

## 2020-11-28 NOTE — Progress Notes (Signed)
[facility] OFFICE PROGRESS NOTE   Diagnosis: Colon cancer  INTERVAL HISTORY:   April [name] returns as scheduled.  She completed cycle 5 FOLFIRI/Panitumumab 11/14/2020.  Skin rash is stable.  She continues doxycycline.  Diarrhea was controlled with Lomotil.  Some nausea, relieved with home medication.  She notes a poor energy level and intermittent sweating.  Objective:  Vital signs in last 24 hours:  Blood pressure (!) 140/94, pulse 81, temperature 97.8 F (36.6 C), temperature source Oral, resp. rate 18, height _0  (1.702 m), weight 160 lb 12.8 oz (72.9 kg), last menstrual period 01/07/2006, SpO2 100 %.    HEENT: No thrush or ulcers. Resp: Lungs clear bilaterally. Cardio: Regular rate and rhythm. GI: Abdomen soft and nontender.  No hepatosplenomegaly. Vascular: No leg edema. Skin: Acne type rash scattered over the face. Right upper extremity PICC without erythema.  Lab Results:  Lab Results  Component Value Date   WBC 5.4 11/28/2020   HGB 11.6 (L) 11/28/2020   HCT 35.4 (L) 11/28/2020   MCV 95.9 11/28/2020   PLT 304 11/28/2020   NEUTROABS 2.2 11/28/2020    Imaging:  No results found.  Medications: I have reviewed the patient's current medications.  Assessment/Plan: Sigmoid colon cancer, stage IV (pT4a,pN2b,M1c) Colonoscopy 03/24/2019-3 rectal polyps-hyperplastic polyps, distal colon biopsy-at least intramucosal adenocarcinoma, completely obstructing mass in the distal sigmoid colon, could not be traversed, intact mismatch repair protein expression 03/24/2019-CEA 56.1 03/29/2019 CT abdomen/pelvis-circumferential thickening involving the entire mid and distal sigmoid colon to the level of the rectosigmoid junction, solid/cystic mass in the left ovary, bilateral nephrolithiasis Robotic assisted low anterior resection, mesenteric lymphadenectomy, bilateral salpingo-oophorectomy 04/08/2019 Pathology ([facility] review of outside pathology) metastatic  adenocarcinoma involving the peritoneum overlying the round ligament, serosa of the urinary bladder, serosa of the right ureter a sacral area, pelvic peritoneum, and left ovary.  Omental biopsy with focal mucin pools with no carcinoma cells identified, right hemidiaphragm biopsy involved by metastatic adenocarcinoma, invasive adenocarcinoma the sigmoid colon, moderately differentiated, T4a, perineural and vascular invasion present, 7/22 lymph nodes, multiple tumor deposits, resection margins negative Negative for PD-L1, low probability of MSI-high, HER-2 negative, negative for BRAF, NRAS and KRAS alterations CTs 05/19/2019-no evidence of metastatic disease, findings suspicious for colitis of the transverse and ascending colon, small amount of ascites in the cul-de-sac, bilateral renal calculi Cycle 1 FOLFIRI 05/24/2019, bevacizumab added with cycle 2 Cycle 4 FOLFIRI/bevacizumab 07/05/2019 Cycle 5 FOLFIRI 07/27/2019, bevacizumab held secondary to hypertension Cycle 6 FOLFIRI 08/10/1999, bevacizumab held secondary to hypertension CTs 08/30/2019-no evidence of recurrent disease, new subsolid right lower lobe nodule felt to be inflammatory, emphysema Cycle 7 FOLFIRI 09/26/2019, bevacizumab remains on hold secondary to hypertension Cycle 8 FOLFIRI 10/17/2019, bevacizumab held secondary to hypertension, Udenyca added for neutropenia Cycle 9 FOLFIRI 11/07/2019, bevacizumab held secondary to hypertension, Udenyca Cycle 10 FOLFIRI 11/28/2019, bevacizumab held, Udenyca Cycle 11 FOLFIRI 12/26/2019, bevacizumab held, Udenyca CTs 01/25/2020-no evidence of recurrent disease, resolution of right lower lobe nodule Maintenance Xeloda beginning 02/06/2020 CTs 04/25/2020- no evidence of metastatic disease, multiple bilateral renal calculi without hydronephrosis Maintenance Xeloda continued CT abdomen/pelvis without contrast 08/10/2020-bilateral staghorn renal calculi, no evidence of metastatic disease; addendum 08/23/2020-small but  increasing omental nodules. Cycle 1 FOLFIRI/panitumumab 09/19/2020 Cycle 2 FOLFIRI/Panitumumab 10/03/2020 Cycle 3 FOLFIRI/Panitumumab 10/17/2020 Cycle 4 FOLFIRI/panitumumab 10/31/2020 Cycle 5 FOLFIRI/Panitumumab 11/14/2020 CTs 11/26/2020-stable omental metastases compared to 10/22/2020, mildly decreased from 08/10/2020.  Left lower quadrant tiny paracolic gutter implant slightly decreased from CT 08/10/2020.  No new or progressive metastatic disease  in the abdomen or pelvis. Cycle 6 FOLFIRI/Panitumumab 11/28/2020, irinotecan dose reduced, treatment schedule adjusted to every 3 weeks going forward   Hypertension G4 P3, twins Kidney stones-bilateral staghorn renal calculi on CT 08/10/2020 Infected Port-A-Cath 09/12/2019-placed on Augmentin, referred for Port-A-Cath removal; Port-A-Cath removed 09/14/2019; PICC line placed 09/14/2019; culture staph aureus.  Course of Septra completed. Neutropenia secondary to chemotherapy-Udenyca added with cycle 8 FOLFIRI Right nephrostomy tube 09/12/2020; stent placed 10/09/2020, percutaneous nephrostolithotomy treatment of right-sided kidney stones 10/09/2020, malpositioned right ureter stent replacement 10/23/2020, right ureter stent removed 10/29/2020  Disposition: April [name] appears stable.  She has completed 5 cycles of FOLFIRI/Panitumumab.  Recent restaging CTs show stable to decreased omental metastases, no new or progressive metastatic disease.  Dr. Benay Spice reviewed the results with her at today's visit and recommends continuation of the current treatment with modifications.  Plan to proceed with cycle 6 FOLFIRI/Panitumumab today as scheduled.  The irinotecan will be dose reduced and going forward treatment will be given on a 3-week schedule.  She agrees with this plan.  CBC and chemistry panel reviewed.  Labs are adequate to proceed as above.  She has mild hypokalemia.  She will try to take the potassium supplement daily.  She will return for lab, follow-up,  treatment on 12/24/2020 (date per her request).  She will contact the office in the interim with any problems.  Patient seen with Dr. Benay Spice.    Ned Card ANP/GNP-BC   11/28/2020  11:02 AM  This was a shared visit with Ned Card.  April [name] has completed 5 cycles of FOLFIRI/panitumumab.  The restaging CTs reveal stable to improved disease.  I recommend continuing FOLFIRI/panitumumab.  She reports prolonged malaise following chemotherapy.  We decided to change to a 3-week interval.  The irinotecan will be dose reduced secondary to cholinergic symptoms and diarrhea.  I was present for greater than 50% of today's visit.  I performed medical decision making.  Julieanne Manson, MD

## 2020-11-29 ENCOUNTER — Other Ambulatory Visit: Payer: Self-pay | Admitting: Nurse Practitioner

## 2020-11-30 ENCOUNTER — Inpatient Hospital Stay: Payer: Medicaid Other

## 2020-11-30 ENCOUNTER — Other Ambulatory Visit: Payer: Self-pay

## 2020-11-30 VITALS — BP 107/84 | HR 79 | Temp 98.7°F | Resp 18

## 2020-11-30 DIAGNOSIS — C19 Malignant neoplasm of rectosigmoid junction: Secondary | ICD-10-CM

## 2020-11-30 DIAGNOSIS — Z5111 Encounter for antineoplastic chemotherapy: Secondary | ICD-10-CM | POA: Diagnosis not present

## 2020-11-30 MED ORDER — HEPARIN SOD (PORK) LOCK FLUSH 100 UNIT/ML IV SOLN
500.0000 [IU] | Freq: Once | INTRAVENOUS | Status: AC | PRN
  Administered 2020-11-30: 250 [IU]

## 2020-11-30 MED ORDER — SODIUM CHLORIDE 0.9% FLUSH
10.0000 mL | INTRAVENOUS | Status: DC | PRN
  Administered 2020-11-30: 10 mL

## 2020-11-30 NOTE — Patient Instructions (Signed)
PICC Insertion, Care After The following information offers guidance on how to care for yourself after your procedure. Your health care provider may also give you more specific instructions. If you have problems or questions, contact your health care provider. What can I expect after the procedure? After the procedure, it is common to have mild discomfort and bruising at your insertion site. Follow these instructions at home: Bathing  Do not take baths, swim, or use a hot tub until your health care provider approves. Ask your health care provider if you may take showers. You may only be allowed to take sponge baths. Keep the bandage (dressing) dry. If it gets wet, it needs to be changed right away to help prevent infection. Activity  Rest as told by your health care provider. Ask your health care provider when you can return to your normal activities. If your PICC is near or at the bend of your elbow, avoid activity with repeated motion at the elbow. Do not lift anything that is heavier than 10 lb (4.5 kg), or the limit that you are told, until your health care provider says that it is safe. If you use a crutch, do not use it with the arm on the same side as your PICC. You may need to use a walker instead. Avoid any activity that requires great effort as told by your health care provider. General instructions If you were given a sedative during the procedure, it can affect you for several hours. Do not drive or operate machinery until your health care provider says that it is safe. Take over-the-counter and prescription medicines only as told by your health care provider. Flush the PICC as told by your health care provider. Let your health care provider know right away if the PICC is hard to flush or does not flush. Do not use force to flush the PICC. Do not use any products that contain nicotine or tobacco. These products include cigarettes, chewing tobacco, and vaping devices, such as  e-cigarettes. These can delay healing after surgery. If you need help quitting, ask your health care provider. Check your insertion site every day for signs of infection. Check for: More redness, swelling, or pain. Fluid or blood. Warmth. Pus or a bad smell. Keep all follow-up visits. This is important. You will need to have your PICC dressing changed at least once a week. Contact a health care provider if: You have pain in your arm, ear, face, or teeth. You have a fever or chills. You have more redness, swelling, or pain around your insertion site. You have fluid or blood coming from your insertion site. Your insertion site feels warm to the touch. You have pus or a bad smell coming from your insertion site. Your vein feels hard and raised like a cord under the skin around your insertion site. Your PICC dressing has gotten wet. Your PICC dressing is coming off. Tape it on until it can be changed. Get help right away if: You have problems with the PICC. These include if your PICC: Is accidentally pulled all the way out. If this happens, cover the insertion site with a gauze dressing. Do not throw the PICC away. Your health care provider will need to check it to be sure the entire catheter came out. Cannot be flushed, is hard to flush, or leaks around the insertion site when flushed. Was tugged or pulled and has partially come out. Do not push the PICC back in. Makes a flushing sound when  flushed. Appears to have a hole or tear. You feel your heart racing or skipping beats, or you have chest pain. You have swelling, redness, warmth, or pain in the arm or leg where the PICC is placed. You have a red streak going up your arm that starts under your PICC dressing. You have numbness or tingling in your fingers, hand, or arm. These symptoms may be an emergency. Get help right away. Call 911. Do not wait to see if the symptoms will go away. Do not drive yourself to the hospital. Summary After  your procedure, it is common to have mild discomfort and bruising at your insertion site. Do not lift anything that is heavier than 10 lb (4.5 kg), or the limit that you are told, until your health care provider says that it is safe. Flush the PICC as told by your health care provider. Tell your health care provider right away if the PICC is hard to flush or does not flush. Do not use force to flush the PICC. Check your insertion site every day for signs of infection. This information is not intended to replace advice given to you by your health care provider. Make sure you discuss any questions you have with your health care provider. Document Revised: 07/18/2020 Document Reviewed: 07/18/2020 Elsevier Patient Education  [employer].

## 2020-12-05 ENCOUNTER — Inpatient Hospital Stay: Payer: Medicaid Other

## 2020-12-05 ENCOUNTER — Telehealth: Payer: Self-pay

## 2020-12-05 NOTE — Telephone Encounter (Signed)
Called patient inquiring about missed picc line dressing change appt today.  Patient stated she has family in town and would like to wait until 12/12/20 to have dressing changed.  Reminded patient of importance of weekly picc line dressing changes.  Patient instructed to call office if dressing becomes loose or if she notices any changes in appearance.  Patient verbalized understanding.

## 2020-12-12 ENCOUNTER — Inpatient Hospital Stay: Payer: Medicaid Other

## 2020-12-12 ENCOUNTER — Other Ambulatory Visit: Payer: Self-pay

## 2020-12-12 VITALS — BP 108/64 | HR 92 | Temp 98.5°F | Resp 18

## 2020-12-12 DIAGNOSIS — Z5111 Encounter for antineoplastic chemotherapy: Secondary | ICD-10-CM | POA: Diagnosis not present

## 2020-12-12 DIAGNOSIS — C19 Malignant neoplasm of rectosigmoid junction: Secondary | ICD-10-CM

## 2020-12-12 DIAGNOSIS — Z95828 Presence of other vascular implants and grafts: Secondary | ICD-10-CM

## 2020-12-12 MED ORDER — HEPARIN SOD (PORK) LOCK FLUSH 100 UNIT/ML IV SOLN
500.0000 [IU] | Freq: Once | INTRAVENOUS | Status: AC
  Administered 2020-12-12: 500 [IU]

## 2020-12-12 MED ORDER — SODIUM CHLORIDE 0.9% FLUSH
10.0000 mL | Freq: Once | INTRAVENOUS | Status: AC
  Administered 2020-12-12: 10 mL

## 2020-12-12 NOTE — Patient Instructions (Signed)
PICC Home Care Guide A peripherally inserted central catheter (PICC) is a form of IV access that allows medicines and IV fluids to be quickly put into the blood and spread throughout the body. The PICC is a long, thin, flexible tube (catheter) that is put into a vein in a person's arm or leg. The catheter ends in a large vein just outside the heart called the superior vena cava (SVC). After the PICC is put in, a chest X-ray may be done to make sure that it is in the right place. A PICC may be placed for different reasons, such as: To give medicines and liquid nutrition. To give IV fluids and blood products. To take blood samples often. If there is trouble placing a peripheral intravenous (PIV) catheter. If cared for properly, a PICC can remain in place for many months. Having a PICC can allow you to go home from the hospital sooner and continue treatment at home. Medicines and PICC care can be managed at home by a family member, caregiver, or home health care team. What are the risks? Generally, having a PICC is safe. However, problems may occur, including: A blood clot (thrombus) forming in or at the end of the PICC. A blood clot forming in a vein (deep vein thrombosis) or traveling to the lung (pulmonary embolism). Inflammation of the vein (phlebitis) in which the PICC is placed. Infection at the insertion site or in the blood. Blood infections from central lines, like PICCs, can be serious and often require a hospital stay. PICC malposition, or PICC movement or poor placement. A break or cut in the PICC. Do not use scissors near the PICC. Nerve or tendon irritation or injury during PICC insertion. How to care for your PICC Please follow the specific guidelines provided by your health care provider. Preventing infection You and any caregivers should wash your hands often with soap and water for at least 20 seconds. Wash hands: Before touching the PICC or the infusion device. Before changing a  bandage (dressing). Do not change the dressing unless you have been taught to do so and have shown you are able to change it safely. Flush the PICC as told. Tell your health care provider right away if the PICC is hard to flush or does not flush. Do not use force to flush the PICC. Use clean and germ-free (sterile) supplies only. Keep the supplies in a dry place. Do not reuse needles, syringes, or any other supplies. Reusing supplies can lead to infection. Keep the PICC dressing dry and secure it with tape if the edges stop sticking to your skin. Check your PICC insertion site every day for signs of infection. Check for: Redness, swelling, or pain. Fluid or blood. Warmth. Pus or a bad smell. Preventing other problems Do not use a syringe that is less than 10 mL to flush the PICC. Do not have your blood pressure checked on the arm in which the PICC is placed. Do not ever pull or tug on the PICC. Keep it secured to your arm with tape or a stretch wrap when not in use. Do not take the PICC out yourself. Only a trained health care provider should remove the PICC. Keep pets and children away from your PICC. How to care for your PICC dressing Keep your PICC dressing clean and dry to prevent infection. Do not take baths, swim, or use a hot tub until your health care provider approves. Ask your health care provider if you can take   showers. You may only be allowed to take sponge baths. When you are allowed to shower: Ask your health care provider to teach you how to wrap the PICC. Cover the PICC with clear plastic wrap and tape to keep it dry while showering. Follow instructions from your health care provider about how to take care of your insertion site and dressing. Make sure you: Wash your hands with soap and water for at least 20 seconds before and after you change your dressing. If soap and water are not available, use hand sanitizer. Change your dressing only if taught to do so by your health care  provider. Your PICC dressing needs to be changed if it becomes loose or wet. Leave stitches (sutures), skin glue, or adhesive strips in place. These skin closures may need to stay in place for 2 weeks or longer. If adhesive strip edges start to loosen and curl up, you may trim the loose edges. Do not remove adhesive strips completely unless your health care provider tells you to do that. Follow these instructions at home: Disposal of supplies Throw away any syringes in a disposal container that is meant for sharp items (sharps container). You can buy a sharps container from a pharmacy, or you can make one by using an empty, hard plastic bottle with a lid. Place any used dressings or infusion bags into a plastic bag. Throw that bag in the trash. General instructions  Always carry your PICC identification card or wear a medical alert bracelet. Keep the tube clamped at all times, unless it is being used. Always carry a smooth-edge clamp with you to clamp the PICC if it breaks. Do not use scissors or sharp objects near the tube. You may bend your arm and move it freely. If your PICC is near or at the bend of your elbow, avoid activity with repeated motion at the elbow. Avoid lifting heavy objects as told by your health care provider. Keep all follow-up visits. This is important. You will need to have your PICC dressing changed at least once a week. Contact a health care provider if: You have pain in your arm, ear, face, or teeth. You have a fever or chills. You have redness, swelling, or pain around the insertion site. You have fluid or blood coming from the insertion site. Your insertion site feels warm to the touch. You have pus or a bad smell coming from the insertion site. Your skin feels hard and raised around the insertion site. Your PICC dressing has gotten wet or is coming off and you have not been taught how to change it. Get help right away if: You have problems with your PICC, such as  your PICC: Was tugged or pulled and has partially come out. Do not  push the PICC back in. Cannot be flushed, is hard to flush, or leaks around the insertion site when it is flushed. Makes a flushing sound when it is flushed. Appears to have a hole or tear. Is accidentally pulled all the way out. If this happens, cover the insertion site with a gauze dressing. Do not throw the PICC away. Your health care provider will need to check it to be sure the entire catheter came out. You feel your heart racing or skipping beats, or you have chest pain. You have shortness of breath or trouble breathing. You have swelling, redness, warmth, or pain in the arm in which the PICC is placed. You have a red streak going up your arm that   starts under the PICC dressing. These symptoms may be an emergency. Get help right away. Call 911. Do not wait to see if the symptoms will go away. Do not drive yourself to the hospital. Summary A peripherally inserted central catheter (PICC) is a long, thin, flexible tube (catheter) that is put into a vein in the arm or leg. If cared for properly, a PICC can remain in place for many months. Having a PICC can allow you to go home from the hospital sooner and continue treatment at home. The PICC is inserted using a germ-free (sterile) technique by a specially trained health care provider. Only a trained health care provider should remove it. Do not have your blood pressure checked on the arm in which your PICC is placed. Always keep your PICC identification card with you. This information is not intended to replace advice given to you by your health care provider. Make sure you discuss any questions you have with your health care provider. Document Revised: 07/18/2020 Document Reviewed: 07/18/2020 Elsevier Patient Education  2022 [employer]  

## 2020-12-19 ENCOUNTER — Inpatient Hospital Stay: Payer: Medicaid Other | Attending: Oncology

## 2020-12-19 ENCOUNTER — Other Ambulatory Visit: Payer: Self-pay

## 2020-12-19 VITALS — BP 142/98 | HR 99 | Temp 98.8°F | Resp 18

## 2020-12-19 DIAGNOSIS — Z5111 Encounter for antineoplastic chemotherapy: Secondary | ICD-10-CM | POA: Insufficient documentation

## 2020-12-19 DIAGNOSIS — Z5112 Encounter for antineoplastic immunotherapy: Secondary | ICD-10-CM | POA: Diagnosis present

## 2020-12-19 DIAGNOSIS — C187 Malignant neoplasm of sigmoid colon: Secondary | ICD-10-CM | POA: Insufficient documentation

## 2020-12-19 DIAGNOSIS — Z79899 Other long term (current) drug therapy: Secondary | ICD-10-CM | POA: Diagnosis not present

## 2020-12-19 DIAGNOSIS — Z95828 Presence of other vascular implants and grafts: Secondary | ICD-10-CM

## 2020-12-19 DIAGNOSIS — C19 Malignant neoplasm of rectosigmoid junction: Secondary | ICD-10-CM

## 2020-12-19 MED ORDER — HEPARIN SOD (PORK) LOCK FLUSH 100 UNIT/ML IV SOLN
500.0000 [IU] | Freq: Once | INTRAVENOUS | Status: AC
  Administered 2020-12-19: 500 [IU]

## 2020-12-19 MED ORDER — SODIUM CHLORIDE 0.9% FLUSH
10.0000 mL | Freq: Once | INTRAVENOUS | Status: AC
  Administered 2020-12-19: 10 mL

## 2020-12-19 NOTE — Patient Instructions (Signed)
PICC Home Care Guide A peripherally inserted central catheter (PICC) is a form of IV access that allows medicines and IV fluids to be quickly put into the blood and spread throughout the body. The PICC is a long, thin, flexible tube (catheter) that is put into a vein in a person's arm or leg. The catheter ends in a large vein just outside the heart called the superior vena cava (SVC). After the PICC is put in, a chest X-ray may be done to make sure that it is in the right place. A PICC may be placed for different reasons, such as: To give medicines and liquid nutrition. To give IV fluids and blood products. To take blood samples often. If there is trouble placing a peripheral intravenous (PIV) catheter. If cared for properly, a PICC can remain in place for many months. Having a PICC can allow you to go home from the hospital sooner and continue treatment at home. Medicines and PICC care can be managed at home by a family member, caregiver, or home health care team. What are the risks? Generally, having a PICC is safe. However, problems may occur, including: A blood clot (thrombus) forming in or at the end of the PICC. A blood clot forming in a vein (deep vein thrombosis) or traveling to the lung (pulmonary embolism). Inflammation of the vein (phlebitis) in which the PICC is placed. Infection at the insertion site or in the blood. Blood infections from central lines, like PICCs, can be serious and often require a hospital stay. PICC malposition, or PICC movement or poor placement. A break or cut in the PICC. Do not use scissors near the PICC. Nerve or tendon irritation or injury during PICC insertion. How to care for your PICC Please follow the specific guidelines provided by your health care provider. Preventing infection You and any caregivers should wash your hands often with soap and water for at least 20 seconds. Wash hands: Before touching the PICC or the infusion device. Before changing a  bandage (dressing). Do not change the dressing unless you have been taught to do so and have shown you are able to change it safely. Flush the PICC as told. Tell your health care provider right away if the PICC is hard to flush or does not flush. Do not use force to flush the PICC. Use clean and germ-free (sterile) supplies only. Keep the supplies in a dry place. Do not reuse needles, syringes, or any other supplies. Reusing supplies can lead to infection. Keep the PICC dressing dry and secure it with tape if the edges stop sticking to your skin. Check your PICC insertion site every day for signs of infection. Check for: Redness, swelling, or pain. Fluid or blood. Warmth. Pus or a bad smell. Preventing other problems Do not use a syringe that is less than 10 mL to flush the PICC. Do not have your blood pressure checked on the arm in which the PICC is placed. Do not ever pull or tug on the PICC. Keep it secured to your arm with tape or a stretch wrap when not in use. Do not take the PICC out yourself. Only a trained health care provider should remove the PICC. Keep pets and children away from your PICC. How to care for your PICC dressing Keep your PICC dressing clean and dry to prevent infection. Do not take baths, swim, or use a hot tub until your health care provider approves. Ask your health care provider if you can take   showers. You may only be allowed to take sponge baths. When you are allowed to shower: Ask your health care provider to teach you how to wrap the PICC. Cover the PICC with clear plastic wrap and tape to keep it dry while showering. Follow instructions from your health care provider about how to take care of your insertion site and dressing. Make sure you: Wash your hands with soap and water for at least 20 seconds before and after you change your dressing. If soap and water are not available, use hand sanitizer. Change your dressing only if taught to do so by your health care  provider. Your PICC dressing needs to be changed if it becomes loose or wet. Leave stitches (sutures), skin glue, or adhesive strips in place. These skin closures may need to stay in place for 2 weeks or longer. If adhesive strip edges start to loosen and curl up, you may trim the loose edges. Do not remove adhesive strips completely unless your health care provider tells you to do that. Follow these instructions at home: Disposal of supplies Throw away any syringes in a disposal container that is meant for sharp items (sharps container). You can buy a sharps container from a pharmacy, or you can make one by using an empty, hard plastic bottle with a lid. Place any used dressings or infusion bags into a plastic bag. Throw that bag in the trash. General instructions  Always carry your PICC identification card or wear a medical alert bracelet. Keep the tube clamped at all times, unless it is being used. Always carry a smooth-edge clamp with you to clamp the PICC if it breaks. Do not use scissors or sharp objects near the tube. You may bend your arm and move it freely. If your PICC is near or at the bend of your elbow, avoid activity with repeated motion at the elbow. Avoid lifting heavy objects as told by your health care provider. Keep all follow-up visits. This is important. You will need to have your PICC dressing changed at least once a week. Contact a health care provider if: You have pain in your arm, ear, face, or teeth. You have a fever or chills. You have redness, swelling, or pain around the insertion site. You have fluid or blood coming from the insertion site. Your insertion site feels warm to the touch. You have pus or a bad smell coming from the insertion site. Your skin feels hard and raised around the insertion site. Your PICC dressing has gotten wet or is coming off and you have not been taught how to change it. Get help right away if: You have problems with your PICC, such as  your PICC: Was tugged or pulled and has partially come out. Do not  push the PICC back in. Cannot be flushed, is hard to flush, or leaks around the insertion site when it is flushed. Makes a flushing sound when it is flushed. Appears to have a hole or tear. Is accidentally pulled all the way out. If this happens, cover the insertion site with a gauze dressing. Do not throw the PICC away. Your health care provider will need to check it to be sure the entire catheter came out. You feel your heart racing or skipping beats, or you have chest pain. You have shortness of breath or trouble breathing. You have swelling, redness, warmth, or pain in the arm in which the PICC is placed. You have a red streak going up your arm that   starts under the PICC dressing. These symptoms may be an emergency. Get help right away. Call 911. Do not wait to see if the symptoms will go away. Do not drive yourself to the hospital. Summary A peripherally inserted central catheter (PICC) is a long, thin, flexible tube (catheter) that is put into a vein in the arm or leg. If cared for properly, a PICC can remain in place for many months. Having a PICC can allow you to go home from the hospital sooner and continue treatment at home. The PICC is inserted using a germ-free (sterile) technique by a specially trained health care provider. Only a trained health care provider should remove it. Do not have your blood pressure checked on the arm in which your PICC is placed. Always keep your PICC identification card with you. This information is not intended to replace advice given to you by your health care provider. Make sure you discuss any questions you have with your health care provider. Document Revised: 07/18/2020 Document Reviewed: 07/18/2020 Elsevier Patient Education  2022 [employer]  

## 2020-12-22 ENCOUNTER — Other Ambulatory Visit: Payer: Self-pay | Admitting: Oncology

## 2020-12-24 ENCOUNTER — Inpatient Hospital Stay: Payer: Medicaid Other

## 2020-12-24 ENCOUNTER — Inpatient Hospital Stay (HOSPITAL_BASED_OUTPATIENT_CLINIC_OR_DEPARTMENT_OTHER): Payer: Medicaid Other | Admitting: Oncology

## 2020-12-24 ENCOUNTER — Other Ambulatory Visit: Payer: Self-pay

## 2020-12-24 ENCOUNTER — Encounter: Payer: Self-pay | Admitting: Oncology

## 2020-12-24 VITALS — BP 145/90 | HR 90 | Temp 98.1°F | Resp 20 | Ht 67.0 in | Wt 170.0 lb

## 2020-12-24 DIAGNOSIS — C19 Malignant neoplasm of rectosigmoid junction: Secondary | ICD-10-CM | POA: Diagnosis not present

## 2020-12-24 DIAGNOSIS — Z5111 Encounter for antineoplastic chemotherapy: Secondary | ICD-10-CM | POA: Diagnosis not present

## 2020-12-24 DIAGNOSIS — Z452 Encounter for adjustment and management of vascular access device: Secondary | ICD-10-CM

## 2020-12-24 LAB — CMP (CANCER CENTER ONLY)
ALT: 6 U/L (ref 0–44)
AST: 13 U/L — ABNORMAL LOW (ref 15–41)
Albumin: 4 g/dL (ref 3.5–5.0)
Alkaline Phosphatase: 128 U/L — ABNORMAL HIGH (ref 38–126)
Anion gap: 7 (ref 5–15)
BUN: 8 mg/dL (ref 6–20)
CO2: 30 mmol/L (ref 22–32)
Calcium: 9.2 mg/dL (ref 8.9–10.3)
Chloride: 104 mmol/L (ref 98–111)
Creatinine: 0.62 mg/dL (ref 0.44–1.00)
GFR, Estimated: >60 mL/min
Glucose, Bld: 97 mg/dL (ref 70–99)
Potassium: 3.6 mmol/L (ref 3.5–5.1)
Sodium: 141 mmol/L (ref 135–145)
Total Bilirubin: 0.2 mg/dL — ABNORMAL LOW (ref 0.3–1.2)
Total Protein: 6.8 g/dL (ref 6.5–8.1)

## 2020-12-24 LAB — CBC WITH DIFFERENTIAL (CANCER CENTER ONLY)
Abs Immature Granulocytes: 0.02 10*3/uL (ref 0.00–0.07)
Basophils Absolute: 0 10*3/uL (ref 0.0–0.1)
Basophils Relative: 1 %
Eosinophils Absolute: 0.2 10*3/uL (ref 0.0–0.5)
Eosinophils Relative: 3 %
HCT: 38.5 % (ref 36.0–46.0)
Hemoglobin: 12.3 g/dL (ref 12.0–15.0)
Immature Granulocytes: 0 %
Lymphocytes Relative: 28 %
Lymphs Abs: 2.3 10*3/uL (ref 0.7–4.0)
MCH: 30.9 pg (ref 26.0–34.0)
MCHC: 31.9 g/dL (ref 30.0–36.0)
MCV: 96.7 fL (ref 80.0–100.0)
Monocytes Absolute: 0.5 10*3/uL (ref 0.1–1.0)
Monocytes Relative: 5 %
Neutro Abs: 5.3 10*3/uL (ref 1.7–7.7)
Neutrophils Relative %: 63 %
Platelet Count: 249 10*3/uL (ref 150–400)
RBC: 3.98 MIL/uL (ref 3.87–5.11)
RDW: 14.9 % (ref 11.5–15.5)
WBC Count: 8.3 10*3/uL (ref 4.0–10.5)
nRBC: 0 % (ref 0.0–0.2)

## 2020-12-24 LAB — MAGNESIUM: Magnesium: 1.7 mg/dL (ref 1.7–2.4)

## 2020-12-24 LAB — CEA (ACCESS): CEA (CHCC): 1.99 ng/mL (ref 0.00–5.00)

## 2020-12-24 MED ORDER — SODIUM CHLORIDE 0.9 % IV SOLN
1800.0000 mg/m2 | INTRAVENOUS | Status: DC
  Administered 2020-12-24: 3350 mg via INTRAVENOUS
  Filled 2020-12-24: qty 67

## 2020-12-24 MED ORDER — PALONOSETRON HCL INJECTION 0.25 MG/5ML
0.2500 mg | Freq: Once | INTRAVENOUS | Status: AC
  Administered 2020-12-24: 0.25 mg via INTRAVENOUS
  Filled 2020-12-24: qty 5

## 2020-12-24 MED ORDER — MAGNESIUM SULFATE 2 GM/50ML IV SOLN: 2.0000 g | Freq: Once | INTRAVENOUS | Status: DC

## 2020-12-24 MED ORDER — FLUOROURACIL CHEMO INJECTION 500 MG/10ML
200.0000 mg/m2 | Freq: Once | INTRAVENOUS | Status: AC
  Administered 2020-12-24: 350 mg via INTRAVENOUS
  Filled 2020-12-24: qty 7

## 2020-12-24 MED ORDER — ALTEPLASE 2 MG IJ SOLR
2.0000 mg | Freq: Once | INTRAMUSCULAR | Status: AC
  Administered 2020-12-24: 2 mg
  Filled 2020-12-24: qty 2

## 2020-12-24 MED ORDER — MAGNESIUM SULFATE 2 GM/50ML IV SOLN
2.0000 g | Freq: Once | INTRAVENOUS | Status: AC
  Administered 2020-12-24: 2 g via INTRAVENOUS
  Filled 2020-12-24: qty 50

## 2020-12-24 MED ORDER — SODIUM CHLORIDE 0.9 % IV SOLN
10.0000 mg | Freq: Once | INTRAVENOUS | Status: AC
  Administered 2020-12-24: 10 mg via INTRAVENOUS
  Filled 2020-12-24: qty 1

## 2020-12-24 MED ORDER — SODIUM CHLORIDE 0.9 % IV SOLN
150.0000 mg | Freq: Once | INTRAVENOUS | Status: AC
  Administered 2020-12-24: 150 mg via INTRAVENOUS
  Filled 2020-12-24: qty 5

## 2020-12-24 MED ORDER — SODIUM CHLORIDE 0.9 % IV SOLN
125.0000 mg/m2 | Freq: Once | INTRAVENOUS | Status: AC
  Administered 2020-12-24: 240 mg via INTRAVENOUS
  Filled 2020-12-24: qty 10

## 2020-12-24 MED ORDER — SODIUM CHLORIDE 0.9 % IV SOLN: Freq: Once | INTRAVENOUS | Status: AC

## 2020-12-24 MED ORDER — SODIUM CHLORIDE 0.9 % IV SOLN
6.0000 mg/kg | Freq: Once | INTRAVENOUS | Status: AC
  Administered 2020-12-24: 440 mg via INTRAVENOUS
  Filled 2020-12-24: qty 20

## 2020-12-24 MED ORDER — SODIUM CHLORIDE 0.9 % IV SOLN
200.0000 mg/m2 | Freq: Once | INTRAVENOUS | Status: AC
  Administered 2020-12-24: 372 mg via INTRAVENOUS
  Filled 2020-12-24: qty 18.6

## 2020-12-24 MED ORDER — ATROPINE SULFATE 1 MG/ML IV SOLN
0.5000 mg | Freq: Once | INTRAVENOUS | Status: AC
  Administered 2020-12-24: 0.5 mg via INTRAVENOUS
  Filled 2020-12-24: qty 1

## 2020-12-24 NOTE — Progress Notes (Signed)
Patient presents for treatment. RN assessment completed along with the following:  Labs/vitals reviewed - Yes, and Per Dr. Benay Spice, ok to treat with Mg 1.7.     Weight within 10% of previous measurement - Yes Oncology Treatment Attestation completed for current therapy- Yes, on date 09/07/20 Informed consent completed and reflects current therapy/intent - Yes, on date 08/19/20             Provider progress note reviewed - Yes, today's provider note was reviewed. Treatment/Antibody/Supportive plan reviewed - Yes, and there are no adjustments needed for today's treatment. S&H and other orders reviewed - Yes, and Orders entered for IV Mg replacement.  Previous treatment date reviewed - Yes, and the appropriate amount of time has elapsed between treatments.  Patient to proceed with treatment.

## 2020-12-24 NOTE — Progress Notes (Signed)
[place] OFFICE PROGRESS NOTE   Diagnosis: Colon cancer  INTERVAL HISTORY:   April [name] completed on cycle of FOLFIRI/panitumumab on 11/28/2020.  She did not have significant nausea following the cycle of chemotherapy.  She had diarrhea followed by constipation.  She felt well until early this morning when she had an episode of abdominal pain and nausea/vomiting.  She does not have nausea at present.  Her abdomen is "sore ".  She last had a bowel movement 2 to 3 days ago.  Objective:  Vital signs in last 24 hours:  Blood pressure (!) 145/90, pulse 90, temperature 98.1 F (36.7 C), temperature source Oral, resp. rate 20, height _0  (1.702 m), weight 170 lb (77.1 kg), last menstrual period 01/07/2006, SpO2 100 %.    HEENT: No thrush or ulcers Resp: Lungs clear bilaterally Cardio: Regular rate and rhythm GI: No hepatosplenomegaly, tender in the left lower abdomen right upper abdomen, no mass, soft Vascular: No leg edema  Skin: Mild acne type rash over the face, hyperpigmentation of the hands  Portacath/PICC-without erythema  Lab Results:  Lab Results  Component Value Date   WBC 8.3 12/24/2020   HGB 12.3 12/24/2020   HCT 38.5 12/24/2020   MCV 96.7 12/24/2020   PLT 249 12/24/2020   NEUTROABS 5.3 12/24/2020    CMP  Lab Results  Component Value Date   NA 140 11/28/2020   K 3.3 (L) 11/28/2020   CL 104 11/28/2020   CO2 29 11/28/2020   GLUCOSE 95 11/28/2020   BUN 6 11/28/2020   CREATININE 0.55 11/28/2020   CALCIUM 9.3 11/28/2020   PROT 7.0 11/28/2020   ALBUMIN 3.9 11/28/2020   AST 26 11/28/2020   ALT 34 11/28/2020   ALKPHOS 127 (H) 11/28/2020   BILITOT 0.2 (L) 11/28/2020   GFRNONAA >60 11/28/2020   GFRAA >60 10/17/2019    Lab Results  Component Value Date   CEA1 8.66 (H) 07/02/2020   CEA 2.44 11/14/2020    Medications: I have reviewed the patient's current medications.   Assessment/Plan: Sigmoid colon cancer, stage IV  (pT4a,pN2b,M1c) Colonoscopy 03/24/2019-3 rectal polyps-hyperplastic polyps, distal colon biopsy-at least intramucosal adenocarcinoma, completely obstructing mass in the distal sigmoid colon, could not be traversed, intact mismatch repair protein expression 03/24/2019-CEA 56.1 03/29/2019 CT abdomen/pelvis-circumferential thickening involving the entire mid and distal sigmoid colon to the level of the rectosigmoid junction, solid/cystic mass in the left ovary, bilateral nephrolithiasis Robotic assisted low anterior resection, mesenteric lymphadenectomy, bilateral salpingo-oophorectomy 04/08/2019 Pathology ([facility] review of outside pathology) metastatic adenocarcinoma involving the peritoneum overlying the round ligament, serosa of the urinary bladder, serosa of the right ureter a sacral area, pelvic peritoneum, and left ovary.  Omental biopsy with focal mucin pools with no carcinoma cells identified, right hemidiaphragm biopsy involved by metastatic adenocarcinoma, invasive adenocarcinoma the sigmoid colon, moderately differentiated, T4a, perineural and vascular invasion present, 7/22 lymph nodes, multiple tumor deposits, resection margins negative Negative for PD-L1, low probability of MSI-high, HER-2 negative, negative for BRAF, NRAS and KRAS alterations CTs 05/19/2019-no evidence of metastatic disease, findings suspicious for colitis of the transverse and ascending colon, small amount of ascites in the cul-de-sac, bilateral renal calculi Cycle 1 FOLFIRI 05/24/2019, bevacizumab added with cycle 2 Cycle 4 FOLFIRI/bevacizumab 07/05/2019 Cycle 5 FOLFIRI 07/27/2019, bevacizumab held secondary to hypertension Cycle 6 FOLFIRI 08/10/1999, bevacizumab held secondary to hypertension CTs 08/30/2019-no evidence of recurrent disease, new subsolid right lower lobe nodule felt to be inflammatory, emphysema Cycle 7 FOLFIRI 09/26/2019, bevacizumab remains on hold secondary  to hypertension Cycle 8 FOLFIRI 10/17/2019, bevacizumab held  secondary to hypertension, Udenyca added for neutropenia Cycle 9 FOLFIRI 11/07/2019, bevacizumab held secondary to hypertension, Udenyca Cycle 10 FOLFIRI 11/28/2019, bevacizumab held, Udenyca Cycle 11 FOLFIRI 12/26/2019, bevacizumab held, Udenyca CTs 01/25/2020-no evidence of recurrent disease, resolution of right lower lobe nodule Maintenance Xeloda beginning 02/06/2020 CTs 04/25/2020- no evidence of metastatic disease, multiple bilateral renal calculi without hydronephrosis Maintenance Xeloda continued CT abdomen/pelvis without contrast 08/10/2020-bilateral staghorn renal calculi, no evidence of metastatic disease; addendum 08/23/2020-small but increasing omental nodules. Cycle 1 FOLFIRI/panitumumab 09/19/2020 Cycle 2 FOLFIRI/Panitumumab 10/03/2020 Cycle 3 FOLFIRI/Panitumumab 10/17/2020 Cycle 4 FOLFIRI/panitumumab 10/31/2020 Cycle 5 FOLFIRI/Panitumumab 11/14/2020 CTs 11/26/2020-stable omental metastases compared to 10/22/2020, mildly decreased from 08/10/2020.  Left lower quadrant tiny paracolic gutter implant slightly decreased from CT 08/10/2020.  No new or progressive metastatic disease in the abdomen or pelvis. Cycle 6 FOLFIRI/Panitumumab 11/28/2020, irinotecan dose reduced, treatment schedule adjusted to every 3 weeks going forward Cycle 7 FOLFIRI/panitumumab 12/24/2020   Hypertension G4 P3, twins Kidney stones-bilateral staghorn renal calculi on CT 08/10/2020 Infected Port-A-Cath 09/12/2019-placed on Augmentin, referred for Port-A-Cath removal; Port-A-Cath removed 09/14/2019; PICC line placed 09/14/2019; culture staph aureus.  Course of Septra completed. Neutropenia secondary to chemotherapy-Udenyca added with cycle 8 FOLFIRI Right nephrostomy tube 09/12/2020; stent placed 10/09/2020, percutaneous nephrostolithotomy treatment of right-sided kidney stones 10/09/2020, malpositioned right ureter stent replacement 10/23/2020, right ureter stent removed 10/29/2020    Disposition: April [name] tolerated  the last cycle of chemotherapy better.  She will complete another cycle of FOLFIRI/panitumumab today.  Irinotecan will remain at the reduced dose.  She will call for persistent nausea/vomiting or abdominal pain.  April [name] will return for an office visit and chemotherapy in 3 weeks.  Betsy Coder, MD  12/24/2020  8:44 AM

## 2020-12-24 NOTE — Patient Instructions (Signed)

## 2020-12-24 NOTE — Patient Instructions (Addendum)
The chemotherapy medication bag should finish at 46 hours, 96 hours, or 7 days. For example, if your pump is scheduled for 46 hours and it was put on at 4:00 p.m., it should finish at 2:00 p.m. the day it is scheduled to come off regardless of your appointment time.     Estimated time to finish at  11:45 am on Wednesday December 26, 2020. If the display on your pump reads "Low Volume" and it is beeping, take the batteries out of the pump and come to the cancer center for it to be taken off.   If the pump alarms go off prior to the pump reading "Low Volume" then call [phone] and someone can assist you.  If the plunger comes out and the chemotherapy medication is leaking out, please use your home chemo spill kit to clean up the spill. Do NOT use paper towels or other household products.  If you have problems or questions regarding your pump, please call either [phone] (24 hours a day) or the cancer center Monday-Friday 8:00 a.m.- 4:30 p.m. at the clinic number and we will assist you. If you are unable to get assistance, then go to the nearest Emergency Department and ask the staff to contact the IV team for assistance.  [facility]  Discharge Instructions: Thank you for choosing [facility] to provide your oncology and hematology care.   If you have a lab appointment with the [facility], please go directly to the [facility] and check in at the registration area.   Wear comfortable clothing and clothing appropriate for easy access to any Portacath or PICC line.   We strive to give you quality time with your provider. You may need to reschedule your appointment if you arrive late (15 or more minutes).  Arriving late affects you and other patients whose appointments are after yours.  Also, if you miss three or more appointments without notifying the office, you may be dismissed from the clinic at the provider's discretion.      For  prescription refill requests, have your pharmacy contact our office and allow 72 hours for refills to be completed.    Today you received the following chemotherapy and/or immunotherapy agents panitumumab, irinotecan, leucovorin, fluorouracil    To help prevent nausea and vomiting after your treatment, we encourage you to take your nausea medication as directed.  BELOW ARE SYMPTOMS THAT SHOULD BE REPORTED IMMEDIATELY: *FEVER GREATER THAN 100.4 F (38 C) OR HIGHER *CHILLS OR SWEATING *NAUSEA AND VOMITING THAT IS NOT CONTROLLED WITH YOUR NAUSEA MEDICATION *UNUSUAL SHORTNESS OF BREATH *UNUSUAL BRUISING OR BLEEDING *URINARY PROBLEMS (pain or burning when urinating, or frequent urination) *BOWEL PROBLEMS (unusual diarrhea, constipation, pain near the anus) TENDERNESS IN MOUTH AND THROAT WITH OR WITHOUT PRESENCE OF ULCERS (sore throat, sores in mouth, or a toothache) UNUSUAL RASH, SWELLING OR PAIN  UNUSUAL VAGINAL DISCHARGE OR ITCHING   Items with * indicate a potential emergency and should be followed up as soon as possible or go to the Emergency Department if any problems should occur.  Please show the CHEMOTHERAPY ALERT CARD or IMMUNOTHERAPY ALERT CARD at check-in to the Emergency Department and triage nurse.  Should you have questions after your visit or need to cancel or reschedule your appointment, please contact [facility]  Dept: [phone]  and follow the prompts.  Office hours are 8:00 a.m. to 4:30 p.m. Monday - Friday. Please note that voicemails left  after 4:00 p.m. may not be returned until the following business day.  We are closed weekends and major holidays. You have access to a nurse at all times for urgent questions. Please call the main number to the clinic Dept: [phone] and follow the prompts.   For any non-urgent questions, you may also contact your provider using MyChart. We now offer e-Visits for anyone 64 and older to request care online  for non-urgent symptoms. For details visit [email or web address].   Also download the MyChart app! Go to the app store, search "MyChart", open the app, select [facility], and log in with your MyChart username and password.  Due to Covid, a mask is required upon entering the hospital/clinic. If you do not have a mask, one will be given to you upon arrival. For doctor visits, patients may have 1 support person aged 60 or older with them. For treatment visits, patients cannot have anyone with them due to current Covid guidelines and our immunocompromised population.   Panitumumab Solution for Injection What is this medication? PANITUMUMAB (pan i TOOM ue mab) is a monoclonal antibody. It is used to treat colorectal cancer. This medicine may be used for other purposes; ask your health care provider or pharmacist if you have questions. COMMON BRAND NAME(S): Vectibix What should I tell my care team before I take this medication? They need to know if you have any of these conditions: eye disease, vision problems low levels of calcium, magnesium, or potassium in the blood lung or breathing disease, like asthma skin conditions or sensitivity an unusual or allergic reaction to panitumumab, other medicines, foods, dyes, or preservatives pregnant or trying to get pregnant breast-feeding How should I use this medication? This drug is given as an infusion into a vein. It is administered in a hospital or clinic by a specially trained health care professional. Talk to your pediatrician regarding the use of this medicine in children. Special care may be needed. Overdosage: If you think you have taken too much of this medicine contact a poison control center or emergency room at once. NOTE: This medicine is only for you. Do not share this medicine with others. What if I miss a dose? It is important not to miss your dose. Call your doctor or health care professional if you are unable to keep an  appointment. What may interact with this medication? Do not take this medicine with any of the following medications: bevacizumab This list may not describe all possible interactions. Give your health care provider a list of all the medicines, herbs, non-prescription drugs, or dietary supplements you use. Also tell them if you smoke, drink alcohol, or use illegal drugs. Some items may interact with your medicine. What should I watch for while using this medication? Visit your doctor for checks on your progress. This drug may make you feel generally unwell. This is not uncommon, as chemotherapy can affect healthy cells as well as cancer cells. Report any side effects. Continue your course of treatment even though you feel ill unless your doctor tells you to stop. This medicine can make you more sensitive to the sun. Keep out of the sun while receiving this medicine and for 2 months after the last dose. If you cannot avoid being in the sun, wear protective clothing and use sunscreen. Do not use sun lamps or tanning beds/booths. In some cases, you may be given additional medicines to help with side effects. Follow all directions for their use. Call your doctor or health  care professional for advice if you get a fever, chills or sore throat, or other symptoms of a cold or flu. Do not treat yourself. This drug decreases your body's ability to fight infections. Try to avoid being around people who are sick. Avoid taking products that contain aspirin, acetaminophen, ibuprofen, naproxen, or ketoprofen unless instructed by your doctor. These medicines may hide a fever. Do not become pregnant while taking this medicine and for 2 months after the last dose. Women should inform their doctor if they wish to become pregnant or think they might be pregnant. There is a potential for serious side effects to an unborn child. Talk to your health care professional or pharmacist for more information. Do not breast-feed an  infant while taking this medicine or for 2 months after the last dose. What side effects may I notice from receiving this medication? Side effects that you should report to your doctor or health care professional as soon as possible: allergic reactions like skin rash, itching or hives, swelling of the face, lips, or tongue breathing problems changes in vision eye pain fast, irregular heartbeat fever, chills mouth sores red spots on the skin redness, blistering, peeling or loosening of the skin, including inside the mouth signs and symptoms of kidney injury like trouble passing urine or change in the amount of urine signs and symptoms of low blood pressure like dizziness; feeling faint or lightheaded, falls; unusually weak or tired signs of low calcium like fast heartbeat, muscle cramps or muscle pain; pain, tingling, numbness in the hands or feet; seizures signs and symptoms of low magnesium like muscle cramps, pain, or weakness; tremors; seizures; or fast, irregular heartbeat signs and symptoms of low potassium like muscle cramps or muscle pain; chest pain; dizziness; feeling faint or lightheaded, falls; palpitations; breathing problems; or fast, irregular heartbeat swelling of the ankles, feet, hands Side effects that usually do not require medical attention (report to your doctor or health care professional if they continue or are bothersome): changes in skin like acne, cracks, skin dryness diarrhea eyelash growth headache mouth sores nail changes nausea, vomiting This list may not describe all possible side effects. Call your doctor for medical advice about side effects. You may report side effects to FDA at 1-800-FDA-1088. Where should I keep my medication? This drug is given in a hospital or clinic and will not be stored at home. NOTE: This sheet is a summary. It may not cover all possible information. If you have questions about this medicine, talk to your doctor, pharmacist, or  health care provider.  2022 Elsevier/Gold Standard (2015-07-20 16:45:04)  Irinotecan injection What is this medication? IRINOTECAN (ir in oh TEE kan ) is a chemotherapy drug. It is used to treat colon and rectal cancer. This medicine may be used for other purposes; ask your health care provider or pharmacist if you have questions. COMMON BRAND NAME(S): Camptosar What should I tell my care team before I take this medication? They need to know if you have any of these conditions: dehydration diarrhea infection (especially a virus infection such as chickenpox, cold sores, or herpes) liver disease low blood counts, like low white cell, platelet, or red cell counts low levels of calcium, magnesium, or potassium in the blood recent or ongoing radiation therapy an unusual or allergic reaction to irinotecan, other medicines, foods, dyes, or preservatives pregnant or trying to get pregnant breast-feeding How should I use this medication? This drug is given as an infusion into a vein. It is administered  in a hospital or clinic by a specially trained health care professional. Talk to your pediatrician regarding the use of this medicine in children. Special care may be needed. Overdosage: If you think you have taken too much of this medicine contact a poison control center or emergency room at once. NOTE: This medicine is only for you. Do not share this medicine with others. What if I miss a dose? It is important not to miss your dose. Call your doctor or health care professional if you are unable to keep an appointment. What may interact with this medication? Do not take this medicine with any of the following medications: cobicistat itraconazole This medicine may interact with the following medications: antiviral medicines for HIV or AIDS certain antibiotics like rifampin or rifabutin certain medicines for fungal infections like ketoconazole, posaconazole, and voriconazole certain medicines  for seizures like carbamazepine, phenobarbital, phenotoin clarithromycin gemfibrozil nefazodone St. John's Wort This list may not describe all possible interactions. Give your health care provider a list of all the medicines, herbs, non-prescription drugs, or dietary supplements you use. Also tell them if you smoke, drink alcohol, or use illegal drugs. Some items may interact with your medicine. What should I watch for while using this medication? Your condition will be monitored carefully while you are receiving this medicine. You will need important blood work done while you are taking this medicine. This drug may make you feel generally unwell. This is not uncommon, as chemotherapy can affect healthy cells as well as cancer cells. Report any side effects. Continue your course of treatment even though you feel ill unless your doctor tells you to stop. In some cases, you may be given additional medicines to help with side effects. Follow all directions for their use. You may get drowsy or dizzy. Do not drive, use machinery, or do anything that needs mental alertness until you know how this medicine affects you. Do not stand or sit up quickly, especially if you are an older patient. This reduces the risk of dizzy or fainting spells. Call your health care professional for advice if you get a fever, chills, or sore throat, or other symptoms of a cold or flu. Do not treat yourself. This medicine decreases your body's ability to fight infections. Try to avoid being around people who are sick. Avoid taking products that contain aspirin, acetaminophen, ibuprofen, naproxen, or ketoprofen unless instructed by your doctor. These medicines may hide a fever. This medicine may increase your risk to bruise or bleed. Call your doctor or health care professional if you notice any unusual bleeding. Be careful brushing and flossing your teeth or using a toothpick because you may get an infection or bleed more easily.  If you have any dental work done, tell your dentist you are receiving this medicine. Do not become pregnant while taking this medicine or for 6 months after stopping it. Women should inform their health care professional if they wish to become pregnant or think they might be pregnant. Men should not father a child while taking this medicine and for 3 months after stopping it. There is potential for serious side effects to an unborn child. Talk to your health care professional for more information. Do not breast-feed an infant while taking this medicine or for 7 days after stopping it. This medicine has caused ovarian failure in some women. This medicine may make it more difficult to get pregnant. Talk to your health care professional if you are concerned about your fertility. This medicine  has caused decreased sperm counts in some men. This may make it more difficult to father a child. Talk to your health care professional if you are concerned about your fertility. What side effects may I notice from receiving this medication? Side effects that you should report to your doctor or health care professional as soon as possible: allergic reactions like skin rash, itching or hives, swelling of the face, lips, or tongue chest pain diarrhea flushing, runny nose, sweating during infusion low blood counts - this medicine may decrease the number of white blood cells, red blood cells and platelets. You may be at increased risk for infections and bleeding. nausea, vomiting pain, swelling, warmth in the leg signs of decreased platelets or bleeding - bruising, pinpoint red spots on the skin, black, tarry stools, blood in the urine signs of infection - fever or chills, cough, sore throat, pain or difficulty passing urine signs of decreased red blood cells - unusually weak or tired, fainting spells, lightheadedness Side effects that usually do not require medical attention (report to your doctor or health care  professional if they continue or are bothersome): constipation hair loss headache loss of appetite mouth sores stomach pain This list may not describe all possible side effects. Call your doctor for medical advice about side effects. You may report side effects to FDA at 1-800-FDA-1088. Where should I keep my medication? This drug is given in a hospital or clinic and will not be stored at home. NOTE: This sheet is a summary. It may not cover all possible information. If you have questions about this medicine, talk to your doctor, pharmacist, or health care provider.  2022 Elsevier/Gold Standard (2018-11-30 17:46:13)  Leucovorin injection What is this medication? LEUCOVORIN (loo koe VOR in) is used to prevent or treat the harmful effects of some medicines. This medicine is used to treat anemia caused by a low amount of folic acid in the body. It is also used with 5-fluorouracil (5-FU) to treat colon cancer. This medicine may be used for other purposes; ask your health care provider or pharmacist if you have questions. What should I tell my care team before I take this medication? They need to know if you have any of these conditions: anemia from low levels of vitamin B-12 in the blood an unusual or allergic reaction to leucovorin, folic acid, other medicines, foods, dyes, or preservatives pregnant or trying to get pregnant breast-feeding How should I use this medication? This medicine is for injection into a muscle or into a vein. It is given by a health care professional in a hospital or clinic setting. Talk to your pediatrician regarding the use of this medicine in children. Special care may be needed. Overdosage: If you think you have taken too much of this medicine contact a poison control center or emergency room at once. NOTE: This medicine is only for you. Do not share this medicine with others. What if I miss a dose? This does not apply. What may interact with this  medication? capecitabine fluorouracil phenobarbital phenytoin primidone trimethoprim-sulfamethoxazole This list may not describe all possible interactions. Give your health care provider a list of all the medicines, herbs, non-prescription drugs, or dietary supplements you use. Also tell them if you smoke, drink alcohol, or use illegal drugs. Some items may interact with your medicine. What should I watch for while using this medication? Your condition will be monitored carefully while you are receiving this medicine. This medicine may increase the side effects of 5-fluorouracil, 5-FU.  Tell your doctor or health care professional if you have diarrhea or mouth sores that do not get better or that get worse. What side effects may I notice from receiving this medication? Side effects that you should report to your doctor or health care professional as soon as possible: allergic reactions like skin rash, itching or hives, swelling of the face, lips, or tongue breathing problems fever, infection mouth sores unusual bleeding or bruising unusually weak or tired Side effects that usually do not require medical attention (report to your doctor or health care professional if they continue or are bothersome): constipation or diarrhea loss of appetite nausea, vomiting This list may not describe all possible side effects. Call your doctor for medical advice about side effects. You may report side effects to FDA at 1-800-FDA-1088. Where should I keep my medication? This drug is given in a hospital or clinic and will not be stored at home. NOTE: This sheet is a summary. It may not cover all possible information. If you have questions about this medicine, talk to your doctor, pharmacist, or health care provider.  2022 Elsevier/Gold Standard (2007-07-06 16:50:29)  Fluorouracil, 5-FU injection What is this medication? FLUOROURACIL, 5-FU (flure oh YOOR a sil) is a chemotherapy drug. It slows the growth  of cancer cells. This medicine is used to treat many types of cancer like breast cancer, colon or rectal cancer, pancreatic cancer, and stomach cancer. This medicine may be used for other purposes; ask your health care provider or pharmacist if you have questions. COMMON BRAND NAME(S): Adrucil What should I tell my care team before I take this medication? They need to know if you have any of these conditions: blood disorders dihydropyrimidine dehydrogenase (DPD) deficiency infection (especially a virus infection such as chickenpox, cold sores, or herpes) kidney disease liver disease malnourished, poor nutrition recent or ongoing radiation therapy an unusual or allergic reaction to fluorouracil, other chemotherapy, other medicines, foods, dyes, or preservatives pregnant or trying to get pregnant breast-feeding How should I use this medication? This drug is given as an infusion or injection into a vein. It is administered in a hospital or clinic by a specially trained health care professional. Talk to your pediatrician regarding the use of this medicine in children. Special care may be needed. Overdosage: If you think you have taken too much of this medicine contact a poison control center or emergency room at once. NOTE: This medicine is only for you. Do not share this medicine with others. What if I miss a dose? It is important not to miss your dose. Call your doctor or health care professional if you are unable to keep an appointment. What may interact with this medication? Do not take this medicine with any of the following medications: live virus vaccines This medicine may also interact with the following medications: medicines that treat or prevent blood clots like warfarin, enoxaparin, and dalteparin This list may not describe all possible interactions. Give your health care provider a list of all the medicines, herbs, non-prescription drugs, or dietary supplements you use. Also tell  them if you smoke, drink alcohol, or use illegal drugs. Some items may interact with your medicine. What should I watch for while using this medication? Visit your doctor for checks on your progress. This drug may make you feel generally unwell. This is not uncommon, as chemotherapy can affect healthy cells as well as cancer cells. Report any side effects. Continue your course of treatment even though you feel ill unless your  doctor tells you to stop. In some cases, you may be given additional medicines to help with side effects. Follow all directions for their use. Call your doctor or health care professional for advice if you get a fever, chills or sore throat, or other symptoms of a cold or flu. Do not treat yourself. This drug decreases your body's ability to fight infections. Try to avoid being around people who are sick. This medicine may increase your risk to bruise or bleed. Call your doctor or health care professional if you notice any unusual bleeding. Be careful brushing and flossing your teeth or using a toothpick because you may get an infection or bleed more easily. If you have any dental work done, tell your dentist you are receiving this medicine. Avoid taking products that contain aspirin, acetaminophen, ibuprofen, naproxen, or ketoprofen unless instructed by your doctor. These medicines may hide a fever. Do not become pregnant while taking this medicine. Women should inform their doctor if they wish to become pregnant or think they might be pregnant. There is a potential for serious side effects to an unborn child. Talk to your health care professional or pharmacist for more information. Do not breast-feed an infant while taking this medicine. Men should inform their doctor if they wish to father a child. This medicine may lower sperm counts. Do not treat diarrhea with over the counter products. Contact your doctor if you have diarrhea that lasts more than 2 days or if it is severe and  watery. This medicine can make you more sensitive to the sun. Keep out of the sun. If you cannot avoid being in the sun, wear protective clothing and use sunscreen. Do not use sun lamps or tanning beds/booths. What side effects may I notice from receiving this medication? Side effects that you should report to your doctor or health care professional as soon as possible: allergic reactions like skin rash, itching or hives, swelling of the face, lips, or tongue low blood counts - this medicine may decrease the number of white blood cells, red blood cells and platelets. You may be at increased risk for infections and bleeding. signs of infection - fever or chills, cough, sore throat, pain or difficulty passing urine signs of decreased platelets or bleeding - bruising, pinpoint red spots on the skin, black, tarry stools, blood in the urine signs of decreased red blood cells - unusually weak or tired, fainting spells, lightheadedness breathing problems changes in vision chest pain mouth sores nausea and vomiting pain, swelling, redness at site where injected pain, tingling, numbness in the hands or feet redness, swelling, or sores on hands or feet stomach pain unusual bleeding Side effects that usually do not require medical attention (report to your doctor or health care professional if they continue or are bothersome): changes in finger or toe nails diarrhea dry or itchy skin hair loss headache loss of appetite sensitivity of eyes to the light stomach upset unusually teary eyes This list may not describe all possible side effects. Call your doctor for medical advice about side effects. You may report side effects to FDA at 1-800-FDA-1088. Where should I keep my medication? This drug is given in a hospital or clinic and will not be stored at home. NOTE: This sheet is a summary. It may not cover all possible information. If you have questions about this medicine, talk to your doctor,  pharmacist, or health care provider.  2022 Elsevier/Gold Standard (2018-11-30 15:00:03)  Magnesium Sulfate Injection What is this medication?  MAGNESIUM SULFATE (mag NEE zee um SUL fate) prevents and treats low levels of magnesium in your body. It may also be used to prevent and treat seizures during pregnancy in people with high blood pressure disorders, such as preeclampsia or eclampsia. Magnesium plays an important role in maintaining the health of your muscles and nervous system. This medicine may be used for other purposes; ask your health care provider or pharmacist if you have questions. What should I tell my care team before I take this medication? They need to know if you have any of these conditions: Heart disease History of irregular heart beat Kidney disease An unusual or allergic reaction to magnesium sulfate, medications, foods, dyes, or preservatives Pregnant or trying to get pregnant Breast-feeding How should I use this medication? This medication is for infusion into a vein. It is given in a hospital or clinic setting. Talk to your care team about the use of this medication in children. While this medication may be prescribed for selected conditions, precautions do apply. Overdosage: If you think you have taken too much of this medicine contact a poison control center or emergency room at once. NOTE: This medicine is only for you. Do not share this medicine with others. What if I miss a dose? This does not apply. What may interact with this medication? Certain medications for anxiety or sleep Certain medications for seizures like phenobarbital Digoxin Medications that relax muscles for surgery Narcotic medications for pain This list may not describe all possible interactions. Give your health care provider a list of all the medicines, herbs, non-prescription drugs, or dietary supplements you use. Also tell them if you smoke, drink alcohol, or use illegal drugs. Some items  may interact with your medicine. What should I watch for while using this medication? Your condition will be monitored carefully while you are receiving this medication. You may need blood work done while you are receiving this medication. What side effects may I notice from receiving this medication? Side effects that you should report to your care team as soon as possible: Allergic reactions--skin rash, itching, hives, swelling of the face, lips, tongue, or throat High magnesium level--confusion, drowsiness, facial flushing, redness, sweating, muscle weakness, fast or irregular heartbeat, trouble breathing Low blood pressure--dizziness, feeling faint or lightheaded, blurry vision Side effects that usually do not require medical attention (report to your care team if they continue or are bothersome): Headache Nausea This list may not describe all possible side effects. Call your doctor for medical advice about side effects. You may report side effects to FDA at 1-800-FDA-1088. Where should I keep my medication? This medication is given in a hospital or clinic and will not be stored at home. NOTE: This sheet is a summary. It may not cover all possible information. If you have questions about this medicine, talk to your doctor, pharmacist, or health care provider.  2022 Elsevier/Gold Standard (2020-03-15 00:00:00)

## 2020-12-26 ENCOUNTER — Other Ambulatory Visit: Payer: Self-pay

## 2020-12-26 ENCOUNTER — Inpatient Hospital Stay: Payer: Medicaid Other

## 2020-12-26 VITALS — BP 154/103 | HR 75 | Temp 98.5°F | Resp 18

## 2020-12-26 DIAGNOSIS — C19 Malignant neoplasm of rectosigmoid junction: Secondary | ICD-10-CM

## 2020-12-26 DIAGNOSIS — Z95828 Presence of other vascular implants and grafts: Secondary | ICD-10-CM

## 2020-12-26 DIAGNOSIS — Z5111 Encounter for antineoplastic chemotherapy: Secondary | ICD-10-CM | POA: Diagnosis not present

## 2020-12-26 MED ORDER — HEPARIN SOD (PORK) LOCK FLUSH 100 UNIT/ML IV SOLN: 500.0000 [IU] | Freq: Once | INTRAVENOUS | Status: DC | PRN

## 2020-12-26 MED ORDER — SODIUM CHLORIDE 0.9% FLUSH
10.0000 mL | Freq: Once | INTRAVENOUS | Status: AC
  Administered 2020-12-26: 12:00:00 10 mL

## 2020-12-26 MED ORDER — HEPARIN SOD (PORK) LOCK FLUSH 100 UNIT/ML IV SOLN
500.0000 [IU] | Freq: Once | INTRAVENOUS | Status: AC
  Administered 2020-12-26: 12:00:00 500 [IU]

## 2020-12-26 MED ORDER — SODIUM CHLORIDE 0.9% FLUSH: 10.0000 mL | INTRAVENOUS | Status: DC | PRN

## 2020-12-26 NOTE — Patient Instructions (Signed)

## 2021-01-02 ENCOUNTER — Other Ambulatory Visit: Payer: Self-pay

## 2021-01-02 ENCOUNTER — Inpatient Hospital Stay: Payer: Medicaid Other

## 2021-01-02 DIAGNOSIS — Z5111 Encounter for antineoplastic chemotherapy: Secondary | ICD-10-CM | POA: Diagnosis not present

## 2021-01-02 DIAGNOSIS — Z95828 Presence of other vascular implants and grafts: Secondary | ICD-10-CM

## 2021-01-02 DIAGNOSIS — C19 Malignant neoplasm of rectosigmoid junction: Secondary | ICD-10-CM

## 2021-01-02 MED ORDER — SODIUM CHLORIDE 0.9% FLUSH
10.0000 mL | Freq: Once | INTRAVENOUS | Status: AC
  Administered 2021-01-02: 15:00:00 10 mL

## 2021-01-02 MED ORDER — HEPARIN SOD (PORK) LOCK FLUSH 100 UNIT/ML IV SOLN
500.0000 [IU] | Freq: Once | INTRAVENOUS | Status: AC
  Administered 2021-01-02: 15:00:00 500 [IU]

## 2021-01-02 NOTE — Patient Instructions (Signed)
PICC Home Care Guide A peripherally inserted central catheter (PICC) is a form of IV access that allows medicines and IV fluids to be quickly put into the blood and spread throughout the body. The PICC is a long, thin, flexible tube (catheter) that is put into a vein in a person's arm or leg. The catheter ends in a large vein just outside the heart called the superior vena cava (SVC). After the PICC is put in, a chest X-ray may be done to make sure that it is in the right place. A PICC may be placed for different reasons, such as: To give medicines and liquid nutrition. To give IV fluids and blood products. To take blood samples often. If there is trouble placing a peripheral intravenous (PIV) catheter. If cared for properly, a PICC can remain in place for many months. Having a PICC can allow you to go home from the hospital sooner and continue treatment at home. Medicines and PICC care can be managed at home by a family member, caregiver, or home health care team. What are the risks? Generally, having a PICC is safe. However, problems may occur, including: A blood clot (thrombus) forming in or at the end of the PICC. A blood clot forming in a vein (deep vein thrombosis) or traveling to the lung (pulmonary embolism). Inflammation of the vein (phlebitis) in which the PICC is placed. Infection at the insertion site or in the blood. Blood infections from central lines, like PICCs, can be serious and often require a hospital stay. PICC malposition, or PICC movement or poor placement. A break or cut in the PICC. Do not use scissors near the PICC. Nerve or tendon irritation or injury during PICC insertion. How to care for your PICC Please follow the specific guidelines provided by your health care provider. Preventing infection You and any caregivers should wash your hands often with soap and water for at least 20 seconds. Wash hands: Before touching the PICC or the infusion device. Before changing a  bandage (dressing). Do not change the dressing unless you have been taught to do so and have shown you are able to change it safely. Flush the PICC as told. Tell your health care provider right away if the PICC is hard to flush or does not flush. Do not use force to flush the PICC. Use clean and germ-free (sterile) supplies only. Keep the supplies in a dry place. Do not reuse needles, syringes, or any other supplies. Reusing supplies can lead to infection. Keep the PICC dressing dry and secure it with tape if the edges stop sticking to your skin. Check your PICC insertion site every day for signs of infection. Check for: Redness, swelling, or pain. Fluid or blood. Warmth. Pus or a bad smell. Preventing other problems Do not use a syringe that is less than 10 mL to flush the PICC. Do not have your blood pressure checked on the arm in which the PICC is placed. Do not ever pull or tug on the PICC. Keep it secured to your arm with tape or a stretch wrap when not in use. Do not take the PICC out yourself. Only a trained health care provider should remove the PICC. Keep pets and children away from your PICC. How to care for your PICC dressing Keep your PICC dressing clean and dry to prevent infection. Do not take baths, swim, or use a hot tub until your health care provider approves. Ask your health care provider if you can take   showers. You may only be allowed to take sponge baths. When you are allowed to shower: Ask your health care provider to teach you how to wrap the PICC. Cover the PICC with clear plastic wrap and tape to keep it dry while showering. Follow instructions from your health care provider about how to take care of your insertion site and dressing. Make sure you: Wash your hands with soap and water for at least 20 seconds before and after you change your dressing. If soap and water are not available, use hand sanitizer. Change your dressing only if taught to do so by your health care  provider. Your PICC dressing needs to be changed if it becomes loose or wet. Leave stitches (sutures), skin glue, or adhesive strips in place. These skin closures may need to stay in place for 2 weeks or longer. If adhesive strip edges start to loosen and curl up, you may trim the loose edges. Do not remove adhesive strips completely unless your health care provider tells you to do that. Follow these instructions at home: Disposal of supplies Throw away any syringes in a disposal container that is meant for sharp items (sharps container). You can buy a sharps container from a pharmacy, or you can make one by using an empty, hard plastic bottle with a lid. Place any used dressings or infusion bags into a plastic bag. Throw that bag in the trash. General instructions  Always carry your PICC identification card or wear a medical alert bracelet. Keep the tube clamped at all times, unless it is being used. Always carry a smooth-edge clamp with you to clamp the PICC if it breaks. Do not use scissors or sharp objects near the tube. You may bend your arm and move it freely. If your PICC is near or at the bend of your elbow, avoid activity with repeated motion at the elbow. Avoid lifting heavy objects as told by your health care provider. Keep all follow-up visits. This is important. You will need to have your PICC dressing changed at least once a week. Contact a health care provider if: You have pain in your arm, ear, face, or teeth. You have a fever or chills. You have redness, swelling, or pain around the insertion site. You have fluid or blood coming from the insertion site. Your insertion site feels warm to the touch. You have pus or a bad smell coming from the insertion site. Your skin feels hard and raised around the insertion site. Your PICC dressing has gotten wet or is coming off and you have not been taught how to change it. Get help right away if: You have problems with your PICC, such as  your PICC: Was tugged or pulled and has partially come out. Do not  push the PICC back in. Cannot be flushed, is hard to flush, or leaks around the insertion site when it is flushed. Makes a flushing sound when it is flushed. Appears to have a hole or tear. Is accidentally pulled all the way out. If this happens, cover the insertion site with a gauze dressing. Do not throw the PICC away. Your health care provider will need to check it to be sure the entire catheter came out. You feel your heart racing or skipping beats, or you have chest pain. You have shortness of breath or trouble breathing. You have swelling, redness, warmth, or pain in the arm in which the PICC is placed. You have a red streak going up your arm that   starts under the PICC dressing. These symptoms may be an emergency. Get help right away. Call 911. Do not wait to see if the symptoms will go away. Do not drive yourself to the hospital. Summary A peripherally inserted central catheter (PICC) is a long, thin, flexible tube (catheter) that is put into a vein in the arm or leg. If cared for properly, a PICC can remain in place for many months. Having a PICC can allow you to go home from the hospital sooner and continue treatment at home. The PICC is inserted using a germ-free (sterile) technique by a specially trained health care provider. Only a trained health care provider should remove it. Do not have your blood pressure checked on the arm in which your PICC is placed. Always keep your PICC identification card with you. This information is not intended to replace advice given to you by your health care provider. Make sure you discuss any questions you have with your health care provider. Document Revised: 07/18/2020 Document Reviewed: 07/18/2020 Elsevier Patient Education  2022 [employer]  

## 2021-01-06 ENCOUNTER — Other Ambulatory Visit: Payer: Self-pay | Admitting: Oncology

## 2021-01-08 ENCOUNTER — Encounter: Payer: Self-pay | Admitting: Oncology

## 2021-01-08 ENCOUNTER — Ambulatory Visit (HOSPITAL_BASED_OUTPATIENT_CLINIC_OR_DEPARTMENT_OTHER): Payer: Medicaid Other | Admitting: Nurse Practitioner

## 2021-01-08 ENCOUNTER — Other Ambulatory Visit: Payer: Self-pay | Admitting: *Deleted

## 2021-01-08 MED ORDER — METOPROLOL TARTRATE 25 MG PO TABS: 25.0000 mg | ORAL_TABLET | Freq: Two times a day (BID) | ORAL | 0 refills | Status: DC

## 2021-01-08 NOTE — Patient Instructions (Incomplete)
Thank you for choosing [place] at [facility] for your Primary Care needs. I am excited for the opportunity to partner with you to meet your health care goals. It was a pleasure meeting you today!  Recommendations from today's visit: ***  Information on diet, exercise, and health maintenance recommendations are listed below. This is information to help you be sure you are on track for optimal health and monitoring.   Please look over this and let us know if you have any questions or if you have completed any of the health maintenance outside of [place] so that we can be sure your records are up to date.  ___________________________________________________________ About Me: I am an Adult-Geriatric Nurse Practitioner with a background in caring for patients for more than 20 years with a strong intensive care background. I provide primary care and sports medicine services to patients age 69 and older within this office. My education had a strong focus on caring for the older adult population, which I am passionate about. I am also the director of the APP Fellowship with [facility].   My desire is to provide you with the best service through preventive medicine and supportive care. I consider you a part of the medical team and value your input. I work diligently to ensure that you are heard and your needs are met in a safe and effective manner. I want you to feel comfortable with me as your provider and want you to know that your health concerns are important to me.  For your information, our office hours are: Monday, Tuesday, and Thursday 8:00 AM - 5:00 PM Wednesday and Friday 8:00 AM - 12:00 PM.   In my time away from the office I am teaching new APP's within the system and am unavailable, but my partner, Dr. Burnard Bunting is in the office for emergent needs.   If you have questions or concerns, please call our office at [phone] or send [place] a MyChart message and we will  respond as quickly as possible.  ____________________________________________________________ MyChart:  For all urgent or time sensitive needs we ask that you please call the office to avoid delays. Our number is [phone]. MyChart is not constantly monitored and due to the large volume of messages a day, replies may take up to 72 business hours.  MyChart Policy: MyChart allows for you to see your visit notes, after visit summary, provider recommendations, lab and tests results, make an appointment, request refills, and contact your provider or the office for non-urgent questions or concerns. Providers are seeing patients during normal business hours and do not have built in time to review MyChart messages.  We ask that you allow a minimum of 3 business days for responses to [employer]. For this reason, please do not send urgent requests through [place]. Please call the office at [phone]. New and ongoing conditions may require a visit. We have virtual and in person visit available for your convenience.  Complex MyChart concerns may require a visit. Your provider may request you schedule a virtual or in person visit to ensure we are providing the best care possible. MyChart messages sent after 11:00 AM on Friday will not be received by the provider until Monday morning.    Lab and Test Results: You will receive your lab and test results on MyChart as soon as they are completed and results have been sent by the lab or testing facility. Due to this service, you will receive your results  BEFORE your provider.  I review lab and tests results each morning prior to seeing patients. Some results require collaboration with other providers to ensure you are receiving the most appropriate care. For this reason, we ask that you please allow a minimum of 3-5 business days from the time the ALL results have been received for your provider to receive and review lab and test results and contact you  about these.  Most lab and test result comments from the provider will be sent through [place]. Your provider may recommend changes to the plan of care, follow-up visits, repeat testing, ask questions, or request an office visit to discuss these results. You may reply directly to this message or call the office at [phone] to provide information for the provider or set up an appointment. In some instances, you will be called with test results and recommendations. Please let us know if this is preferred and we will make note of this in your chart to provide this for you.    If you have not heard a response to your lab or test results in 5 business days from all results returning to [place], please call the office to let us know. We ask that you please avoid calling prior to this time unless there is an emergent concern. Due to high call volumes, this can delay the resulting process.  After Hours: For all non-emergency after hours needs, please call the office at [phone] and select the option to reach the on-call provider service. On-call services are shared between multiple [place] offices and therefore it will not be possible to speak directly with your provider. On-call providers may provide medical advice and recommendations, but are unable to provide refills for maintenance medications.  For all emergency or urgent medical needs after normal business hours, we recommend that you seek care at the closest Urgent Care or Emergency Department to ensure appropriate treatment in a timely manner.  [facility] [place] has a 24 hour emergency room located on the ground floor for your convenience.   Urgent Concerns During the Business Day Providers are seeing patients from 8AM to [place] with a busy schedule and are most often not able to respond to non-urgent calls until the end of the day or the next business day. If you should have URGENT concerns during the day, please call and speak  to the nurse or schedule a same day appointment so that we can address your concern without delay.   Thank you, again, for choosing me as your health care partner. I appreciate your trust and look forward to learning more about you.   April Keeler, DNP, AGNP-c ___________________________________________________________  Health Maintenance Recommendations Screening Testing Mammogram Every 1 -2 years based on history and risk factors Starting at age 110 Pap Smear Ages 21-39 every 3 years Ages 55-65 every 5 years with HPV testing More frequent testing may be required based on results and history Colon Cancer Screening Every 1-10 years based on test performed, risk factors, and history Starting at age 6 Bone Density Screening Every 2-10 years based on history Starting at age 38 for women Recommendations for men differ based on medication usage, history, and risk factors AAA Screening One time ultrasound Men 80-19 years old who have every smoked Lung Cancer Screening Low Dose Lung CT every 12 months Age 57-80 years with a 30 pack-year smoking history who still smoke or who have quit within the last 15 years  Screening Labs Routine  Labs: Complete  Blood Count (CBC), Complete Metabolic Panel (CMP), Cholesterol (Lipid Panel) Every 6-12 months based on history and medications May be recommended more frequently based on current conditions or previous results Hemoglobin A1c Lab Every 3-12 months based on history and previous results Starting at age 66 or earlier with diagnosis of diabetes, high cholesterol, BMI >26, and/or risk factors Frequent monitoring for patients with diabetes to ensure blood sugar control Thyroid Panel (TSH w/ T3 & T4) Every 6 months based on history, symptoms, and risk factors May be repeated more often if on medication HIV One time testing for all patients 58 and older May be repeated more frequently for patients with increased risk factors or  exposure Hepatitis C One time testing for all patients 98 and older May be repeated more frequently for patients with increased risk factors or exposure Gonorrhea, Chlamydia Every 12 months for all sexually active persons 13-24 years Additional monitoring may be recommended for those who are considered high risk or who have symptoms PSA Men 28-73 years old with risk factors Additional screening may be recommended from age 2-69 based on risk factors, symptoms, and history  Vaccine Recommendations Tetanus Booster All adults every 10 years Flu Vaccine All patients 6 months and older every year COVID Vaccine All patients 12 years and older Initial dosing with booster May recommend additional booster based on age and health history HPV Vaccine 2 doses all patients age 92-26 Dosing may be considered for patients over 26 Shingles Vaccine (Shingrix) 2 doses all adults 74 years and older Pneumonia (Pneumovax 23) All adults 48 years and older May recommend earlier dosing based on health history Pneumonia (Prevnar 57) All adults 78 years and older Dosed 1 year after Pneumovax 23  Additional Screening, Testing, and Vaccinations may be recommended on an individualized basis based on family history, health history, risk factors, and/or exposure.  __________________________________________________________  Diet Recommendations for All Patients  I recommend that all patients maintain a diet low in saturated fats, carbohydrates, and cholesterol. While this can be challenging at first, it is not impossible and small changes can make big differences.  Things to try: Decreasing the amount of soda, sweet tea, and/or juice to one or less per day and replace with water While water is always the first choice, if you do not like water you may consider adding a water additive without sugar to improve the taste other sugar free drinks Replace potatoes with a brightly colored vegetable at dinner Use  healthy oils, such as canola oil or olive oil, instead of butter or hard margarine Limit your bread intake to two pieces or less a day Replace regular pasta with low carb pasta options Bake, broil, or grill foods instead of frying Monitor portion sizes  Eat smaller, more frequent meals throughout the day instead of large meals  An important thing to remember is, if you love foods that are not great for your health, you don't have to give them up completely. Instead, allow these foods to be a reward when you have done well. Allowing yourself to still have special treats every once in a while is a nice way to tell yourself thank you for working hard to keep yourself healthy.   Also remember that every day is a new day. If you have a bad day and "fall off the wagon", you can still climb right back up and keep moving along on your journey!  We have resources available to help you!  Some websites that may be helpful  include: www.http://carter.biz/  Www.VeryWellFit.com _____________________________________________________________  Activity Recommendations for All Patients  I recommend that all adults get at least 20 minutes of moderate physical activity that elevates your heart rate at least 5 days out of the week.  Some examples include: Walking or jogging at a pace that allows you to carry on a conversation Cycling (stationary bike or outdoors) Water aerobics Yoga Weight lifting Dancing If physical limitations prevent you from putting stress on your joints, exercise in a pool or seated in a chair are excellent options.  Do determine your MAXIMUM heart rate for activity: YOUR AGE - 220 = MAX HeartRate   Remember! Do not push yourself too hard.  Start slowly and build up your pace, speed, weight, time in exercise, etc.  Allow your body to rest between exercise and get good sleep. You will need more water than normal when you are exerting yourself. Do not wait until you are thirsty to drink.  Drink with a purpose of getting in at least 8, 8 ounce glasses of water a day plus more depending on how much you exercise and sweat.    If you begin to develop dizziness, chest pain, abdominal pain, jaw pain, shortness of breath, headache, vision changes, lightheadedness, or other concerning symptoms, stop the activity and allow your body to rest. If your symptoms are severe, seek emergency evaluation immediately. If your symptoms are concerning, but not severe, please let us know so that we can recommend further evaluation.

## 2021-01-08 NOTE — Telephone Encounter (Signed)
Received refill request for metoprolol 25 mg. OK to refill x 1 month w/future refills her new PCP office, Dr. Jacolyn Reedy, NP. Has new patient appointment on 02/12/2021.

## 2021-01-08 NOTE — Progress Notes (Deleted)
Orma Render, DNP, AGNP-c Primary Care & Sports Medicine [address]   [place], [place] [address] [phone]  New patient visit   Patient: April [name]   DOB: 11-14-1975   45 y.o. Female  MRN: [id number] Visit Date: 01/08/2021  Patient Care Team: Ruben Im, PA-C as PCP - General (Physician Assistant) Ladell Pier, MD as Consulting Physician (Oncology)  Today's healthcare provider: Orma Render, NP   No chief complaint on file.  Subjective    HPI  April [name] is a 45 y.o. female who presents today as a new patient to establish care.    She has a medical history positive for HTN, prolonged QT interval, Hyperglycemia, Renal calculus, Hydronephrosis of right kidney, and metastatic colon cancer.   [name] is currently under the care of Dr. Benay Spice with oncology. She *** currently undergoing treatments.  She currently has a PICC line for IV access.   She currently lives with ***. She *** employed with ***. She *** nicotine product usage of *** with *** a day.  She *** alcohol usage with *** drinks a ***. She *** currently sexually active with *** partners.  She *** feeling safe in her home environment.   Her last pap was *** with ***   Past Medical History:  Diagnosis Date   Hypertension    Renal calculi    bilateral   Past Surgical History:  Procedure Laterality Date   CESAREAN SECTION     x2   CYSTOSCOPY WITH RETROGRADE PYELOGRAM, URETEROSCOPY AND STENT PLACEMENT Right 10/22/2020   Procedure: CYSTOSCOPY WITH RETROGRADE PYELOGRAM, URETEROSCOPY AND STENT PLACEMENT;  Surgeon: Robley Fries, MD;  Location: [facility];  Service: Urology;  Laterality: Right;   IR RADIOLOGIST EVAL & MGMT  09/16/2019   IR RADIOLOGIST EVAL & MGMT  09/23/2019   IR RADIOLOGIST EVAL & MGMT  09/20/2019   IR RADIOLOGIST EVAL & MGMT  09/30/2019   IR REMOVAL TUN ACCESS W/ PORT W/O FL MOD SED  09/14/2019   URETERAL STENT PLACEMENT     Family  Status  Relation Name Status   Mother  (Not Specified)   Father  (Not Specified)   Sister  (Not Specified)   Family History  Problem Relation Age of Onset   Hypertension Mother    Diabetes Mellitus II Father    Stroke Father    Ulcers Sister    Social History   Socioeconomic History   Marital status: Single    Spouse name: Not on file   Number of children: Not on file   Years of education: Not on file   Highest education level: Not on file  Occupational History   Not on file  Tobacco Use   Smoking status: Every Day    Packs/day: 0.50    Types: Cigarettes   Smokeless tobacco: Never   Tobacco comments:    In the process of quitting/ has been able to cut back  Vaping Use   Vaping Use: Never used  Substance and Sexual Activity   Alcohol use: Yes   Drug use: Not Currently   Sexual activity: Not Currently  Other Topics Concern   Not on file  Social History Narrative   Not on file   Social Determinants of Health   Financial Resource Strain: Medium Risk   Difficulty of Paying Living Expenses: Somewhat hard  Food Insecurity: Food Insecurity Present   Worried About Running Out of Food in the Last Year: Often true  Ran Out of Food in the Last Year: Often true  Transportation Needs: Not on file  Physical Activity: Not on file  Stress: Not on file  Social Connections: Not on file   Outpatient Medications Prior to Visit  Medication Sig   amLODipine (NORVASC) 10 MG tablet Take 1 tablet (10 mg total) by mouth daily.   dexamethasone (DECADRON) 4 MG tablet Take 1 tablet (4 mg total) by mouth 2 (two) times daily with a meal. Start on day 2 of treatment and take for 3 days with each cycle.   diphenoxylate-atropine (LOMOTIL) 2.5-0.025 MG tablet Take 1 tablet by mouth 4 (four) times daily as needed for diarrhea or loose stools.   doxycycline (VIBRAMYCIN) 100 MG capsule Take 100 mg by mouth 2 (two) times daily.   hydrALAZINE (APRESOLINE) 10 MG tablet TAKE 1 TABLET(10 MG) BY MOUTH  THREE TIMES DAILY (Patient taking differently: Take 10 mg by mouth 3 (three) times daily.)   hydrochlorothiazide (HYDRODIURIL) 25 MG tablet TAKE 1 TABLET BY MOUTH EVERY DAY (Patient taking differently: Take 25 mg by mouth daily.)   hydrocortisone 2.5 % cream Apply topically daily.   ibuprofen (ADVIL) 200 MG tablet Take 400 mg by mouth every 6 (six) hours as needed for headache, fever or mild pain. (Patient not taking: Reported on 11/28/2020)   lactose free nutrition (BOOST) LIQD Take 237 mLs by mouth daily.   LORazepam (ATIVAN) 0.5 MG tablet Take 0.5 mg by mouth every 6 (six) hours as needed (for nausea).   metoprolol tartrate (LOPRESSOR) 25 MG tablet Take 25 mg by mouth 2 (two) times daily.   ondansetron (ZOFRAN) 4 MG tablet Take 1 tablet (4 mg total) by mouth every 8 (eight) hours as needed for nausea or vomiting. Do not take for 3 days after chemo   potassium chloride SA (KLOR-CON) 20 MEQ tablet TAKE 1 TABLET BY MOUTH EVERY DAY   prochlorperazine (COMPAZINE) 10 MG tablet Take 1 tablet (10 mg total) by mouth every 6 (six) hours as needed for nausea or vomiting.   No facility-administered medications prior to visit.   Allergies  Allergen Reactions   Lisinopril Swelling    Lip swelling   Irinotecan Other (See Comments)    Stomach cramping Patient given 0.5 mL atropine IV and was able to continue infusion.  See Progress note on 10/03/20.    Leucovorin Calcium Other (See Comments)    Stomach cramping Patient given 0.5 mL atropine IV and was able to continue infusion. See Progress note on 10/03/20.     Immunization History  Administered Date(s) Administered   PFIZER(Purple Top)SARS-COV-2 Vaccination 08/31/2019, 09/23/2019   PPD Test 05/24/2018    Health Maintenance  Topic Date Due   Pneumococcal Vaccine 29-44 Years old (1 - PCV) Never done   Hepatitis C Screening  Never done   TETANUS/TDAP  Never done   PAP SMEAR-Modifier  Never done   COLONOSCOPY (Pts 45-60yrs Insurance coverage will  need to be confirmed)  Never done   COVID-19 Vaccine (3 - Pfizer risk series) 10/21/2019   INFLUENZA VACCINE  10/14/2021 (Originally 08/13/2020)   HIV Screening  Completed   HPV VACCINES  Aged Out    Patient Care Team: Gilles Chiquito, Shyrl Numbers as PCP - General (Physician Assistant) Ladell Pier, MD as Consulting Physician (Oncology)  Review of Systems All review of systems negative except what is listed in the HPI   Objective    LMP 01/07/2006  Physical Exam  Depression Screen PHQ 2/9 Scores 09/09/2019  PHQ - 2 Score 0   No results found for any visits on 01/08/21.  Assessment & Plan      Problem List Items Addressed This Visit   None Visit Diagnoses     Encounter for medical examination to establish care    -  Primary        No follow-ups on file.      Lillionna Nabi, Coralee Pesa, NP, DNP, AGNP-C Primary Care & Sports Medicine at [place]

## 2021-01-08 NOTE — Telephone Encounter (Signed)
Sees Jacolyn Reedy, NP with IM as new patient on 02/12/21. Will discuss w/MD to agree to refill x 1, then PCP follow.

## 2021-01-09 ENCOUNTER — Other Ambulatory Visit: Payer: Self-pay

## 2021-01-09 ENCOUNTER — Ambulatory Visit (HOSPITAL_BASED_OUTPATIENT_CLINIC_OR_DEPARTMENT_OTHER)
Admission: RE | Admit: 2021-01-09 | Discharge: 2021-01-09 | Disposition: A | Discharge status: Home / Self Care | Payer: Medicaid Other | Source: Ambulatory Visit | Attending: Oncology | Admitting: Oncology

## 2021-01-09 ENCOUNTER — Inpatient Hospital Stay: Payer: Medicaid Other

## 2021-01-09 VITALS — BP 137/108 | HR 85 | Temp 98.9°F | Resp 20

## 2021-01-09 DIAGNOSIS — Z452 Encounter for adjustment and management of vascular access device: Secondary | ICD-10-CM | POA: Insufficient documentation

## 2021-01-09 DIAGNOSIS — C19 Malignant neoplasm of rectosigmoid junction: Secondary | ICD-10-CM | POA: Diagnosis present

## 2021-01-14 ENCOUNTER — Other Ambulatory Visit: Payer: Self-pay | Admitting: Oncology

## 2021-01-16 ENCOUNTER — Other Ambulatory Visit: Payer: Self-pay | Admitting: Nurse Practitioner

## 2021-01-16 ENCOUNTER — Inpatient Hospital Stay: Payer: Medicaid Other

## 2021-01-16 ENCOUNTER — Other Ambulatory Visit: Payer: Self-pay

## 2021-01-16 ENCOUNTER — Encounter (HOSPITAL_BASED_OUTPATIENT_CLINIC_OR_DEPARTMENT_OTHER): Payer: Self-pay | Admitting: Emergency Medicine

## 2021-01-16 ENCOUNTER — Inpatient Hospital Stay: Payer: Medicaid Other | Attending: Oncology

## 2021-01-16 ENCOUNTER — Encounter: Payer: Self-pay | Admitting: Nurse Practitioner

## 2021-01-16 ENCOUNTER — Inpatient Hospital Stay (HOSPITAL_BASED_OUTPATIENT_CLINIC_OR_DEPARTMENT_OTHER): Payer: Medicaid Other | Admitting: Nurse Practitioner

## 2021-01-16 ENCOUNTER — Emergency Department (HOSPITAL_BASED_OUTPATIENT_CLINIC_OR_DEPARTMENT_OTHER): Payer: Medicaid Other

## 2021-01-16 ENCOUNTER — Emergency Department (HOSPITAL_BASED_OUTPATIENT_CLINIC_OR_DEPARTMENT_OTHER)
Admission: EM | Admit: 2021-01-16 | Discharge: 2021-01-16 | Disposition: A | Discharge status: Home / Self Care | Payer: Medicaid Other | Attending: Emergency Medicine | Admitting: Emergency Medicine

## 2021-01-16 VITALS — BP 145/80 | HR 86 | Temp 97.8°F | Resp 18 | Ht 67.0 in | Wt 173.0 lb

## 2021-01-16 VITALS — BP 168/122 | HR 72 | Resp 18

## 2021-01-16 DIAGNOSIS — C786 Secondary malignant neoplasm of retroperitoneum and peritoneum: Secondary | ICD-10-CM | POA: Insufficient documentation

## 2021-01-16 DIAGNOSIS — Z5111 Encounter for antineoplastic chemotherapy: Secondary | ICD-10-CM | POA: Insufficient documentation

## 2021-01-16 DIAGNOSIS — C19 Malignant neoplasm of rectosigmoid junction: Secondary | ICD-10-CM

## 2021-01-16 DIAGNOSIS — G43809 Other migraine, not intractable, without status migrainosus: Secondary | ICD-10-CM | POA: Diagnosis not present

## 2021-01-16 DIAGNOSIS — I10 Essential (primary) hypertension: Secondary | ICD-10-CM

## 2021-01-16 DIAGNOSIS — Z85038 Personal history of other malignant neoplasm of large intestine: Secondary | ICD-10-CM | POA: Diagnosis not present

## 2021-01-16 DIAGNOSIS — C7962 Secondary malignant neoplasm of left ovary: Secondary | ICD-10-CM | POA: Insufficient documentation

## 2021-01-16 DIAGNOSIS — Z5112 Encounter for antineoplastic immunotherapy: Secondary | ICD-10-CM | POA: Insufficient documentation

## 2021-01-16 DIAGNOSIS — Z79899 Other long term (current) drug therapy: Secondary | ICD-10-CM | POA: Diagnosis not present

## 2021-01-16 DIAGNOSIS — Z452 Encounter for adjustment and management of vascular access device: Secondary | ICD-10-CM | POA: Insufficient documentation

## 2021-01-16 DIAGNOSIS — C187 Malignant neoplasm of sigmoid colon: Secondary | ICD-10-CM | POA: Diagnosis present

## 2021-01-16 DIAGNOSIS — R030 Elevated blood-pressure reading, without diagnosis of hypertension: Secondary | ICD-10-CM | POA: Diagnosis present

## 2021-01-16 HISTORY — DX: Malignant (primary) neoplasm, unspecified: C80.1

## 2021-01-16 HISTORY — DX: Malignant neoplasm of colon, unspecified: C18.9

## 2021-01-16 LAB — CBC WITH DIFFERENTIAL (CANCER CENTER ONLY)
Abs Immature Granulocytes: 0.01 10*3/uL (ref 0.00–0.07)
Basophils Absolute: 0 10*3/uL (ref 0.0–0.1)
Basophils Relative: 1 %
Eosinophils Absolute: 0.3 10*3/uL (ref 0.0–0.5)
Eosinophils Relative: 6 %
HCT: 39.8 % (ref 36.0–46.0)
Hemoglobin: 12.8 g/dL (ref 12.0–15.0)
Immature Granulocytes: 0 %
Lymphocytes Relative: 40 %
Lymphs Abs: 2.2 10*3/uL (ref 0.7–4.0)
MCH: 31 pg (ref 26.0–34.0)
MCHC: 32.2 g/dL (ref 30.0–36.0)
MCV: 96.4 fL (ref 80.0–100.0)
Monocytes Absolute: 0.4 10*3/uL (ref 0.1–1.0)
Monocytes Relative: 8 %
Neutro Abs: 2.6 10*3/uL (ref 1.7–7.7)
Neutrophils Relative %: 45 %
Platelet Count: 286 10*3/uL (ref 150–400)
RBC: 4.13 MIL/uL (ref 3.87–5.11)
RDW: 15.6 % — ABNORMAL HIGH (ref 11.5–15.5)
WBC Count: 5.6 10*3/uL (ref 4.0–10.5)
nRBC: 0 % (ref 0.0–0.2)

## 2021-01-16 LAB — CMP (CANCER CENTER ONLY)
ALT: 11 U/L (ref 0–44)
AST: 18 U/L (ref 15–41)
Albumin: 4 g/dL (ref 3.5–5.0)
Alkaline Phosphatase: 134 U/L — ABNORMAL HIGH (ref 38–126)
Anion gap: 8 (ref 5–15)
BUN: <5 mg/dL — ABNORMAL LOW (ref 6–20)
CO2: 28 mmol/L (ref 22–32)
Calcium: 9.2 mg/dL (ref 8.9–10.3)
Chloride: 105 mmol/L (ref 98–111)
Creatinine: 0.6 mg/dL (ref 0.44–1.00)
GFR, Estimated: >60 mL/min (ref >60)
Glucose, Bld: 84 mg/dL (ref 70–99)
Potassium: 3.4 mmol/L — ABNORMAL LOW (ref 3.5–5.1)
Sodium: 141 mmol/L (ref 135–145)
Total Bilirubin: 0.2 mg/dL — ABNORMAL LOW (ref 0.3–1.2)
Total Protein: 7 g/dL (ref 6.5–8.1)

## 2021-01-16 LAB — MAGNESIUM: Magnesium: 1.7 mg/dL (ref 1.7–2.4)

## 2021-01-16 MED ORDER — HYDROCODONE-ACETAMINOPHEN 5-325 MG PO TABS: 1.0000 | ORAL_TABLET | Freq: Four times a day (QID) | ORAL | 0 refills | Status: DC | PRN

## 2021-01-16 MED ORDER — PALONOSETRON HCL INJECTION 0.25 MG/5ML
0.2500 mg | Freq: Once | INTRAVENOUS | Status: AC
  Administered 2021-01-16: 0.25 mg via INTRAVENOUS
  Filled 2021-01-16: qty 5

## 2021-01-16 MED ORDER — SODIUM CHLORIDE 0.9 % IV SOLN
200.0000 mg/m2 | Freq: Once | INTRAVENOUS | Status: DC
  Filled 2021-01-16: qty 18.6

## 2021-01-16 MED ORDER — METOPROLOL TARTRATE 25 MG PO TABS
25.0000 mg | ORAL_TABLET | Freq: Once | ORAL | Status: AC
  Administered 2021-01-16: 25 mg via ORAL
  Filled 2021-01-16: qty 1

## 2021-01-16 MED ORDER — HYDROCHLOROTHIAZIDE 25 MG PO TABS
25.0000 mg | ORAL_TABLET | Freq: Once | ORAL | Status: AC
  Administered 2021-01-16: 25 mg via ORAL
  Filled 2021-01-16: qty 1

## 2021-01-16 MED ORDER — FLUOROURACIL CHEMO INJECTION 500 MG/10ML
200.0000 mg/m2 | Freq: Once | INTRAVENOUS | Status: DC
  Filled 2021-01-16: qty 7

## 2021-01-16 MED ORDER — ATROPINE SULFATE 1 MG/ML IV SOLN: 0.5000 mg | Freq: Once | INTRAVENOUS | Status: DC

## 2021-01-16 MED ORDER — SODIUM CHLORIDE 0.9 % IV SOLN
10.0000 mg | Freq: Once | INTRAVENOUS | Status: AC
  Administered 2021-01-16: 10 mg via INTRAVENOUS
  Filled 2021-01-16: qty 1

## 2021-01-16 MED ORDER — MORPHINE SULFATE (PF) 2 MG/ML IV SOLN
2.0000 mg | Freq: Once | INTRAVENOUS | Status: AC
  Administered 2021-01-16: 2 mg via INTRAVENOUS
  Filled 2021-01-16: qty 1

## 2021-01-16 MED ORDER — AMLODIPINE BESYLATE 5 MG PO TABS
10.0000 mg | ORAL_TABLET | Freq: Once | ORAL | Status: AC
Start: 2021-01-16 — End: 2021-01-16
  Administered 2021-01-16: 10 mg via ORAL
  Filled 2021-01-16: qty 2

## 2021-01-16 MED ORDER — CLONIDINE HCL 0.1 MG PO TABS
0.1000 mg | ORAL_TABLET | Freq: Once | ORAL | Status: AC
  Administered 2021-01-16: 0.1 mg via ORAL
  Filled 2021-01-16: qty 1

## 2021-01-16 MED ORDER — SODIUM CHLORIDE 0.9 % IV SOLN
150.0000 mg | Freq: Once | INTRAVENOUS | Status: AC
  Administered 2021-01-16: 150 mg via INTRAVENOUS
  Filled 2021-01-16: qty 5

## 2021-01-16 MED ORDER — SODIUM CHLORIDE 0.9 % IV SOLN
125.0000 mg/m2 | Freq: Once | INTRAVENOUS | Status: DC
  Filled 2021-01-16: qty 12

## 2021-01-16 MED ORDER — SODIUM CHLORIDE 0.9 % IV BOLUS
1000.0000 mL | Freq: Once | INTRAVENOUS | Status: AC
  Administered 2021-01-16: 1000 mL via INTRAVENOUS

## 2021-01-16 MED ORDER — SODIUM CHLORIDE 0.9 % IV SOLN
6.0000 mg/kg | Freq: Once | INTRAVENOUS | Status: DC
  Filled 2021-01-16: qty 22

## 2021-01-16 MED ORDER — HEPARIN SOD (PORK) LOCK FLUSH 100 UNIT/ML IV SOLN: 500.0000 [IU] | Freq: Once | INTRAVENOUS | Status: DC | PRN

## 2021-01-16 MED ORDER — DEXAMETHASONE 4 MG PO TABS: 4.0000 mg | ORAL_TABLET | Freq: Two times a day (BID) | ORAL | 1 refills | Status: DC

## 2021-01-16 MED ORDER — ALTEPLASE 2 MG IJ SOLR
2.0000 mg | Freq: Once | INTRAMUSCULAR | Status: AC
  Administered 2021-01-16: 2 mg
  Filled 2021-01-16: qty 2

## 2021-01-16 MED ORDER — SODIUM CHLORIDE 0.9 % IV SOLN
1800.0000 mg/m2 | INTRAVENOUS | Status: DC
  Filled 2021-01-16: qty 67

## 2021-01-16 MED ORDER — SODIUM CHLORIDE 0.9 % IV SOLN: Freq: Once | INTRAVENOUS | Status: AC

## 2021-01-16 MED ORDER — KETOROLAC TROMETHAMINE 15 MG/ML IJ SOLN
15.0000 mg | Freq: Once | INTRAMUSCULAR | Status: AC
  Administered 2021-01-16: 15 mg via INTRAVENOUS
  Filled 2021-01-16: qty 1

## 2021-01-16 MED ORDER — SODIUM CHLORIDE 0.9% FLUSH
10.0000 mL | INTRAVENOUS | Status: DC | PRN
  Administered 2021-01-16: 10 mL

## 2021-01-16 MED ORDER — PROCHLORPERAZINE EDISYLATE 10 MG/2ML IJ SOLN
10.0000 mg | Freq: Once | INTRAMUSCULAR | Status: AC
  Administered 2021-01-16: 10 mg via INTRAVENOUS
  Filled 2021-01-16: qty 2

## 2021-01-16 MED ORDER — HYDRALAZINE HCL 10 MG PO TABS
10.0000 mg | ORAL_TABLET | Freq: Once | ORAL | Status: AC
  Administered 2021-01-16: 10 mg via ORAL
  Filled 2021-01-16: qty 1

## 2021-01-16 NOTE — ED Triage Notes (Signed)
Pt reports was sent by cancer center , had chemo scheduled today , unable to receive it due to high BP reading . Pt denies any symptom

## 2021-01-16 NOTE — Addendum Note (Signed)
Addended by: Owens Shark on: 01/16/2021 01:22 PM   Modules accepted: Level of Service

## 2021-01-16 NOTE — Progress Notes (Addendum)
OFFICE PROGRESS NOTE   Diagnosis:  Colon cancer  INTERVAL HISTORY:   April [name] returns as scheduled.  She completed another cycle of FOLFIRI/Panitumumab 12/24/2020.  In general she is feeling better.  She continues to have intermittent nausea/vomiting.  Some upper abdominal discomfort.  No mouth sores.  Bowels alternate constipation and diarrhea.  She takes an antidiarrheal if needed.  Rash is better.  Objective:  Vital signs in last 24 hours:  Blood pressure (!) 145/80, pulse 86, temperature 97.8 F (36.6 C), temperature source Oral, resp. rate 18, height _0  (1.702 m), weight 173 lb (78.5 kg), last menstrual period 01/07/2006, SpO2 100 %.    HEENT: No thrush or ulcers. Resp: Lungs clear bilaterally. Cardio: Regular rate and rhythm. GI: Abdomen is soft, tender at the upper abdomen.  No hepatomegaly.  No mass. Vascular: No leg edema. Skin: Mild acne type rash over the face. Right upper extremity PICC without erythema.   Lab Results:  Lab Results  Component Value Date   WBC 5.6 01/16/2021   HGB 12.8 01/16/2021   HCT 39.8 01/16/2021   MCV 96.4 01/16/2021   PLT 286 01/16/2021   NEUTROABS 2.6 01/16/2021    Imaging:  No results found.  Medications: I have reviewed the patient's current medications.  Assessment/Plan: Sigmoid colon cancer, stage IV (pT4a,pN2b,M1c) Colonoscopy 03/24/2019-3 rectal polyps-hyperplastic polyps, distal colon biopsy-at least intramucosal adenocarcinoma, completely obstructing mass in the distal sigmoid colon, could not be traversed, intact mismatch repair protein expression 03/24/2019-CEA 56.1 03/29/2019 CT abdomen/pelvis-circumferential thickening involving the entire mid and distal sigmoid colon to the level of the rectosigmoid junction, solid/cystic mass in the left ovary, bilateral nephrolithiasis Robotic assisted low anterior resection, mesenteric lymphadenectomy, bilateral salpingo-oophorectomy  04/08/2019 Pathology ([facility] review of outside pathology) metastatic adenocarcinoma involving the peritoneum overlying the round ligament, serosa of the urinary bladder, serosa of the right ureter a sacral area, pelvic peritoneum, and left ovary.  Omental biopsy with focal mucin pools with no carcinoma cells identified, right hemidiaphragm biopsy involved by metastatic adenocarcinoma, invasive adenocarcinoma the sigmoid colon, moderately differentiated, T4a, perineural and vascular invasion present, 7/22 lymph nodes, multiple tumor deposits, resection margins negative Negative for PD-L1, low probability of MSI-high, HER-2 negative, negative for BRAF, NRAS and KRAS alterations CTs 05/19/2019-no evidence of metastatic disease, findings suspicious for colitis of the transverse and ascending colon, small amount of ascites in the cul-de-sac, bilateral renal calculi Cycle 1 FOLFIRI 05/24/2019, bevacizumab added with cycle 2 Cycle 4 FOLFIRI/bevacizumab 07/05/2019 Cycle 5 FOLFIRI 07/27/2019, bevacizumab held secondary to hypertension Cycle 6 FOLFIRI 08/10/1999, bevacizumab held secondary to hypertension CTs 08/30/2019-no evidence of recurrent disease, new subsolid right lower lobe nodule felt to be inflammatory, emphysema Cycle 7 FOLFIRI 09/26/2019, bevacizumab remains on hold secondary to hypertension Cycle 8 FOLFIRI 10/17/2019, bevacizumab held secondary to hypertension, Udenyca added for neutropenia Cycle 9 FOLFIRI 11/07/2019, bevacizumab held secondary to hypertension, Udenyca Cycle 10 FOLFIRI 11/28/2019, bevacizumab held, Udenyca Cycle 11 FOLFIRI 12/26/2019, bevacizumab held, Udenyca CTs 01/25/2020-no evidence of recurrent disease, resolution of right lower lobe nodule Maintenance Xeloda beginning 02/06/2020 CTs 04/25/2020- no evidence of metastatic disease, multiple bilateral renal calculi without hydronephrosis Maintenance Xeloda continued CT abdomen/pelvis without contrast 08/10/2020-bilateral staghorn renal  calculi, no evidence of metastatic disease; addendum 08/23/2020-small but increasing omental nodules. Cycle 1 FOLFIRI/panitumumab 09/19/2020 Cycle 2 FOLFIRI/Panitumumab 10/03/2020 Cycle 3 FOLFIRI/Panitumumab 10/17/2020 Cycle 4 FOLFIRI/panitumumab 10/31/2020 Cycle 5 FOLFIRI/Panitumumab 11/14/2020 CTs 11/26/2020-stable omental metastases compared to 10/22/2020, mildly decreased from 08/10/2020.  Left lower quadrant tiny paracolic  gutter implant slightly decreased from CT 08/10/2020.  No new or progressive metastatic disease in the abdomen or pelvis. Cycle 6 FOLFIRI/Panitumumab 11/28/2020, irinotecan dose reduced, treatment schedule adjusted to every 3 weeks going forward Cycle 7 FOLFIRI/panitumumab 12/24/2020 Cycle 8 FOLFIRI/Panitumumab 01/16/2021--treatment held due to hypertension in the infusion area.   Hypertension G4 P3, twins Kidney stones-bilateral staghorn renal calculi on CT 08/10/2020 Infected Port-A-Cath 09/12/2019-placed on Augmentin, referred for Port-A-Cath removal; Port-A-Cath removed 09/14/2019; PICC line placed 09/14/2019; culture staph aureus.  Course of Septra completed. Neutropenia secondary to chemotherapy-Udenyca added with cycle 8 FOLFIRI Right nephrostomy tube 09/12/2020; stent placed 10/09/2020, percutaneous nephrostolithotomy treatment of right-sided kidney stones 10/09/2020, malpositioned right ureter stent replacement 10/23/2020, right ureter stent removed 10/29/2020      Disposition: April [name] appears stable.  She is on active treatment with FOLFIRI/Panitumumab every 3 weeks.  She continues to note improved tolerance of treatment since adjustments were made with cycle 6.  Plan to proceed with cycle 8 today as scheduled.  CBC from today reviewed.  Counts adequate to proceed with treatment.  For intermittent upper abdominal pain a prescription was sent to her pharmacy for hydrocodone 1 tablet every 6 hours as needed.  She will return for lab, follow-up, treatment in 3 weeks.  We  are available to see her sooner if needed.   Ned Card ANP/GNP-BC   01/16/2021  8:47 AM   Addendum 1:18 PM-April [name] complained of a "pinch" at the left chest when she arrived to the infusion area for treatment.  Manual blood pressure markedly elevated on multiple occasions.  She complained of a headache.  No shortness of breath.  No nausea or vomiting.  She reports being on 4 blood pressure medications.  She did not take any this morning prior to coming to the office.  She was given clonidine 0.1 mg at 1108.  She continued to have significant hypertension.  We elected to hold today's treatment and refer her to the emergency department. Plan discussed with Dr. Benay Spice.

## 2021-01-16 NOTE — Progress Notes (Signed)
Patient seen by Lisa Thomas NP today  Vitals are within treatment parameters.  Labs reviewed by Lisa Thomas NP and are within treatment parameters.  Per physician team, patient is ready for treatment and there are NO modifications to the treatment plan.     

## 2021-01-16 NOTE — Patient Instructions (Signed)
[facility]  Discharge Instructions: Thank you for choosing [facility] to provide your oncology and hematology care.   If you have a lab appointment with the [facility], please go directly to the [facility] and check in at the registration area.   Wear comfortable clothing and clothing appropriate for easy access to any Portacath or PICC line.   We strive to give you quality time with your provider. You may need to reschedule your appointment if you arrive late (15 or more minutes).  Arriving late affects you and other patients whose appointments are after yours.  Also, if you miss three or more appointments without notifying the office, you may be dismissed from the clinic at the providers discretion.      For prescription refill requests, have your pharmacy contact our office and allow 72 hours for refills to be completed.      To help prevent nausea and vomiting after your treatment, we encourage you to take your nausea medication as directed.  BELOW ARE SYMPTOMS THAT SHOULD BE REPORTED IMMEDIATELY: *FEVER GREATER THAN 100.4 F (38 C) OR HIGHER *CHILLS OR SWEATING *NAUSEA AND VOMITING THAT IS NOT CONTROLLED WITH YOUR NAUSEA MEDICATION *UNUSUAL SHORTNESS OF BREATH *UNUSUAL BRUISING OR BLEEDING *URINARY PROBLEMS (pain or burning when urinating, or frequent urination) *BOWEL PROBLEMS (unusual diarrhea, constipation, pain near the anus) TENDERNESS IN MOUTH AND THROAT WITH OR WITHOUT PRESENCE OF ULCERS (sore throat, sores in mouth, or a toothache) UNUSUAL RASH, SWELLING OR PAIN  UNUSUAL VAGINAL DISCHARGE OR ITCHING   Items with * indicate a potential emergency and should be followed up as soon as possible or go to the Emergency Department if any problems should occur.  Please show the CHEMOTHERAPY ALERT CARD or IMMUNOTHERAPY ALERT CARD at check-in to the Emergency Department and triage nurse.  Should you have questions after your visit or  need to cancel or reschedule your appointment, please contact [facility]  Dept: [phone]  and follow the prompts.  Office hours are 8:00 a.m. to 4:30 p.m. Monday - Friday. Please note that voicemails left after 4:00 p.m. may not be returned until the following business day.  We are closed weekends and major holidays. You have access to a nurse at all times for urgent questions. Please call the main number to the clinic Dept: [phone] and follow the prompts.   For any non-urgent questions, you may also contact your provider using MyChart. We now offer e-Visits for anyone 65 and older to request care online for non-urgent symptoms. For details visit [email or web address].   Also download the MyChart app! Go to the app store, search "MyChart", open the app, select [facility], and log in with your MyChart username and password.  Due to Covid, a mask is required upon entering the hospital/clinic. If you do not have a mask, one will be given to you upon arrival. For doctor visits, patients may have 1 support person aged 52 or older with them. For treatment visits, patients cannot have anyone with them due to current Covid guidelines and our immunocompromised population.

## 2021-01-16 NOTE — Discharge Instructions (Addendum)
Continue take your blood pressure medications.  Please follow-up with your oncologist for further evaluation, and to reschedule your chemotherapy.  It was a pleasure taking care of you I hope that you feel better soon.

## 2021-01-16 NOTE — Progress Notes (Signed)
Patient presents for treatment. RN assessment completed along with the following:  Labs/vitals reviewed - Yes, and within treatment parameters.   Weight within 10% of previous measurement - Yes Oncology Treatment Attestation completed for current therapy- Yes, on date 09/07/2020. Informed consent completed and reflects current therapy/intent - Yes, on date 08/19/2020.             Provider progress note reviewed - Yes, today's provider note was reviewed. Treatment/Antibody/Supportive plan reviewed - Yes, and there are no adjustments needed for today's treatment. S&H and other orders reviewed - Yes, and there are no additional orders identified. Previous treatment date reviewed - Yes, and the appropriate amount of time has elapsed between treatments. Clinic Hand Off Received from - [place], [place].   Patient to proceed with treatment.    1020-patient complains of "pinch" in left upper chest and rated 4 on pain scale. Described as intermittent.  Denied SOB, no visual disturbances, and denied radiating pain   See flowsheet for vital signs. Patient did not take her morning blood pressure medications.   Ned Card, NP notified with verbal orders received.  See MAR.    1236-Vital signs reviewed with the PA.  See flowsheet.  No complaints voiced by the patient.  No s/s of distress noted.  Patient to go to the ER verbal order Ned Card, NP.  Report called to the triage nurse.  1242-patient taken to the ER with no complaints or s/s of distress noted.

## 2021-01-16 NOTE — ED Provider Notes (Signed)
[facility] EMERGENCY DEPT Provider Note   CSN: [id number] Arrival date & time: 01/16/21  1244     History  Chief Complaint  Patient presents with   Hypertension    April [name] is a 46 y.o. female with a past medical history significant for metastatic stage IV colon cancer who presents from her chemotherapy center for complaint of hypertension.  Patient reports that she also has a headache, and had related pinching feeling in the chest and so they sent her for further evaluation of her high blood pressure.  Before leaving the cancer center they administered clonidine 0.1 mg for her high blood pressure.  Patient is taking 4 different agents for her high blood pressure, and did not take any of them prior to arriving at the cancer center this morning.  Patient reports that she has a migraine type headache that is worse on the right than the left.  She denies any blurry vision, weakness of her limbs, confusion, or unilateral numbness.  Patient does report that she has some stomach pain, but this is ongoing with her known cancer diagnosis, as well as omental metastases.  Patient denies any active chest pain, shortness of breath at this time.   Hypertension Associated symptoms include abdominal pain and headaches. Pertinent negatives include no chest pain and no shortness of breath.      Home Medications Prior to Admission medications   Medication Sig Start Date End Date Taking? Authorizing Provider  amLODipine (NORVASC) 10 MG tablet Take 1 tablet (10 mg total) by mouth daily. 09/09/19   Marrian Salvage, FNP  dexamethasone (DECADRON) 4 MG tablet Take 1 tablet (4 mg total) by mouth 2 (two) times daily with a meal. Start on day 2 of treatment and take for 3 days with each cycle. 01/16/21   Owens Shark, NP  diphenoxylate-atropine (LOMOTIL) 2.5-0.025 MG tablet Take 1 tablet by mouth 4 (four) times daily as needed for diarrhea or loose stools. 10/03/20   Owens Shark, NP   doxycycline (VIBRAMYCIN) 100 MG capsule Take 100 mg by mouth 2 (two) times daily.    [provider]  hydrALAZINE (APRESOLINE) 10 MG tablet TAKE 1 TABLET(10 MG) BY MOUTH THREE TIMES DAILY Patient taking differently: Take 10 mg by mouth 3 (three) times daily. 10/25/19   Ladell Pier, MD  hydrochlorothiazide (HYDRODIURIL) 25 MG tablet TAKE 1 TABLET BY MOUTH EVERY DAY Patient taking differently: Take 25 mg by mouth daily. 02/07/20   Marrian Salvage, FNP  HYDROcodone-acetaminophen (NORCO/VICODIN) 5-325 MG tablet Take 1 tablet by mouth every 6 (six) hours as needed. 01/16/21   Owens Shark, NP  hydrocortisone 2.5 % cream Apply topically daily. 11/02/20   [provider]  lactose free nutrition (BOOST) LIQD Take 237 mLs by mouth daily.    [provider]  LORazepam (ATIVAN) 0.5 MG tablet Take 0.5 mg by mouth every 6 (six) hours as needed (for nausea).    [provider]  metoprolol tartrate (LOPRESSOR) 25 MG tablet Take 1 tablet (25 mg total) by mouth 2 (two) times daily. 01/08/21   Ladell Pier, MD  ondansetron (ZOFRAN) 4 MG tablet Take 1 tablet (4 mg total) by mouth every 8 (eight) hours as needed for nausea or vomiting. Do not take for 3 days after chemo 10/03/20   Owens Shark, NP  potassium chloride SA (KLOR-CON) 20 MEQ tablet TAKE 1 TABLET BY MOUTH EVERY DAY 11/28/20   Ladell Pier, MD  prochlorperazine (COMPAZINE)  10 MG tablet Take 1 tablet (10 mg total) by mouth every 6 (six) hours as needed for nausea or vomiting. 09/21/20   Owens Shark, NP      Allergies    Lisinopril, Irinotecan, and Leucovorin calcium    Review of Systems   Review of Systems  Respiratory:  Negative for shortness of breath.   Cardiovascular:  Negative for chest pain and palpitations.  Gastrointestinal:  Positive for abdominal pain.  Neurological:  Positive for headaches.  All other systems reviewed and are negative.  Physical Exam Updated Vital Signs BP (!)  180/121 (BP Location: Left Arm)    Pulse 65    Temp 98.7 F (37.1 C) (Oral)    Resp 19    LMP 01/07/2006    SpO2 99%  Physical Exam Vitals and nursing note reviewed.  Constitutional:      General: She is not in acute distress.    Appearance: Normal appearance.  HENT:     Head: Normocephalic and atraumatic.  Eyes:     General:        Right eye: No discharge.        Left eye: No discharge.     Extraocular Movements: Extraocular movements intact.     Pupils: Pupils are equal, round, and reactive to light.  Cardiovascular:     Rate and Rhythm: Normal rate and regular rhythm.     Heart sounds: No murmur heard.   No friction rub. No gallop.  Pulmonary:     Effort: Pulmonary effort is normal.     Breath sounds: Normal breath sounds.  Abdominal:     General: Bowel sounds are normal.     Palpations: Abdomen is soft.     Comments: Patient has some vague nonfocal abdominal tenderness.  She has no rebound, rigidity, guarding throughout.  She has no evidence of distention.  Skin:    General: Skin is warm and dry.     Capillary Refill: Capillary refill takes less than 2 seconds.  Neurological:     Mental Status: She is alert and oriented to person, place, and time.     Comments: Cranial nerves II through XII grossly intact, intact strength 5 out of 5 bilateral upper and lower extremities.  Intact finger-nose.  Romberg negative, gait normal.  Alert and oriented x3.  Psychiatric:        Mood and Affect: Mood normal.        Behavior: Behavior normal.    ED Results / Procedures / Treatments   Labs (all labs ordered are listed, but only abnormal results are displayed) Labs Reviewed - No data to display  EKG None  Radiology CT Head Wo Contrast  Result Date: 01/16/2021 CLINICAL DATA:  Headache, new or worsening, neuro deficit (Age 47-49y). : "Pt reports was sent by cancer center , had chemo scheduled today , unable to receive it due to high BP reading . Pt denies any symptom" EXAM: CT HEAD  WITHOUT CONTRAST TECHNIQUE: Contiguous axial images were obtained from the base of the skull through the vertex without intravenous contrast. COMPARISON:  None. FINDINGS: Brain: No evidence of large-territorial acute infarction. No parenchymal hemorrhage. No mass lesion. No extra-axial collection. No mass effect or midline shift. No hydrocephalus. Basilar cisterns are patent. Vascular: No hyperdense vessel. Skull: No acute fracture or focal lesion. Sinuses/Orbits: Paranasal sinuses and mastoid air cells are clear. The orbits are unremarkable. Other: None. IMPRESSION: No acute intracranial abnormality. Electronically Signed   By: Clelia Croft.D.  On: 01/16/2021 18:39    Procedures Procedures    Medications Ordered in ED Medications  amLODipine (NORVASC) tablet 10 mg (10 mg Oral Given 01/16/21 1816)  hydrALAZINE (APRESOLINE) tablet 10 mg (10 mg Oral Given 01/16/21 1816)  hydrochlorothiazide (HYDRODIURIL) tablet 25 mg (25 mg Oral Given 01/16/21 1816)  metoprolol tartrate (LOPRESSOR) tablet 25 mg (25 mg Oral Given 01/16/21 1816)  sodium chloride 0.9 % bolus 1,000 mL (0 mLs Intravenous Stopped 01/16/21 2118)  ketorolac (TORADOL) 15 MG/ML injection 15 mg (15 mg Intravenous Given 01/16/21 1928)  morphine 2 MG/ML injection 2 mg (2 mg Intravenous Given 01/16/21 1928)  prochlorperazine (COMPAZINE) injection 10 mg (10 mg Intravenous Given 01/16/21 1928)    ED Course/ Medical Decision Making/ A&P                           Medical Decision Making  I discussed this case with my attending physician who cosigned this note including patient's presenting symptoms, physical exam, and planned diagnostics and interventions. Attending physician stated agreement with plan or made changes to plan which were implemented.   This is a baseline ill patient with known stage IV metastatic colon cancer who presents for evaluation of her hypertension, headache from her cancer center today.  On my exam she does appear to have a  migraine headache and is in some discomfort, as well as having persistently elevated blood pressure on multiple readings on arrival.  Patient does not have any chest pain, shortness of breath, vision changes, or focal neurologic deficits.  My differential diagnosis for this problem includes uncontrolled hypertension secondary to not take her medications, new intracranial abnormality, or metastasis, as well as we want to rule out possibility of endorgan damage.  In the context that she has no chest pain, no shortness of breath throughout her stay minimal clinical concern for ACS, I do not believe that she needs work-up for this condition.    I have reviewed the lab work that was obtained at her cancer center appointment this morning which is noncontributory to her condition today, this includes CBC, CMP.  She has a mild hypokalemia, persistently elevated alkaline phosphatase.  Her CBC is unremarkable, including normal hemoglobin, normal white cell count.  She is 3 weeks from her last chemotherapy appointment.  Additionally her magnesium is normal today.  We obtained a CT head after reviewing this lab work which does not show any new intracranial abnormality or metastasis.  In this context we will treat her for a migraine as she has no other focal neurologic deficits as well as administer her home hypertension medications.  She still has somewhat elevated blood pressure after administration of her high antihypertensive medications, however her headache is resolved, she continues to have no chest pain, no shortness of breath.  Patient feels better and request discharge at this time.  I believe that she is stable for discharge at night recommend that she follow-up with her oncologist at her earliest convenience. Final Clinical Impression(s) / ED Diagnoses Final diagnoses:  Hypertension, unspecified type  Other migraine without status migrainosus, not intractable    Rx / DC Orders ED Discharge Orders      None         Dorien Chihuahua 01/16/21 2158    Truddie Hidden, MD 01/16/21 2300

## 2021-01-18 ENCOUNTER — Inpatient Hospital Stay: Payer: Medicaid Other

## 2021-01-20 ENCOUNTER — Other Ambulatory Visit: Payer: Self-pay | Admitting: Oncology

## 2021-01-21 ENCOUNTER — Inpatient Hospital Stay: Payer: Medicaid Other

## 2021-01-21 ENCOUNTER — Other Ambulatory Visit: Payer: Self-pay

## 2021-01-21 VITALS — BP 144/98 | HR 87 | Temp 98.1°F | Resp 18 | Wt 166.4 lb

## 2021-01-21 DIAGNOSIS — C19 Malignant neoplasm of rectosigmoid junction: Secondary | ICD-10-CM

## 2021-01-21 DIAGNOSIS — Z5111 Encounter for antineoplastic chemotherapy: Secondary | ICD-10-CM | POA: Diagnosis not present

## 2021-01-21 MED ORDER — FLUOROURACIL CHEMO INJECTION 500 MG/10ML
200.0000 mg/m2 | Freq: Once | INTRAVENOUS | Status: AC
  Administered 2021-01-21: 350 mg via INTRAVENOUS
  Filled 2021-01-21: qty 7

## 2021-01-21 MED ORDER — SODIUM CHLORIDE 0.9 % IV SOLN
10.0000 mg | Freq: Once | INTRAVENOUS | Status: AC
  Administered 2021-01-21: 10 mg via INTRAVENOUS
  Filled 2021-01-21: qty 1

## 2021-01-21 MED ORDER — SODIUM CHLORIDE 0.9 % IV SOLN
1800.0000 mg/m2 | INTRAVENOUS | Status: DC
  Administered 2021-01-21: 3350 mg via INTRAVENOUS
  Filled 2021-01-21: qty 67

## 2021-01-21 MED ORDER — SODIUM CHLORIDE 0.9 % IV SOLN
150.0000 mg | Freq: Once | INTRAVENOUS | Status: AC
  Administered 2021-01-21: 150 mg via INTRAVENOUS
  Filled 2021-01-21: qty 5

## 2021-01-21 MED ORDER — SODIUM CHLORIDE 0.9 % IV SOLN
6.0000 mg/kg | Freq: Once | INTRAVENOUS | Status: AC
  Administered 2021-01-21: 440 mg via INTRAVENOUS
  Filled 2021-01-21: qty 20

## 2021-01-21 MED ORDER — SODIUM CHLORIDE 0.9 % IV SOLN: Freq: Once | INTRAVENOUS | Status: AC

## 2021-01-21 MED ORDER — ATROPINE SULFATE 1 MG/ML IV SOLN
0.5000 mg | Freq: Once | INTRAVENOUS | Status: AC
  Administered 2021-01-21: 0.5 mg via INTRAVENOUS
  Filled 2021-01-21: qty 1

## 2021-01-21 MED ORDER — SODIUM CHLORIDE 0.9 % IV SOLN
200.0000 mg/m2 | Freq: Once | INTRAVENOUS | Status: AC
  Administered 2021-01-21: 372 mg via INTRAVENOUS
  Filled 2021-01-21: qty 18.6

## 2021-01-21 MED ORDER — SODIUM CHLORIDE 0.9 % IV SOLN
125.0000 mg/m2 | Freq: Once | INTRAVENOUS | Status: AC
  Administered 2021-01-21: 240 mg via INTRAVENOUS
  Filled 2021-01-21: qty 2

## 2021-01-21 MED ORDER — PALONOSETRON HCL INJECTION 0.25 MG/5ML
0.2500 mg | Freq: Once | INTRAVENOUS | Status: AC
  Administered 2021-01-21: 0.25 mg via INTRAVENOUS
  Filled 2021-01-21: qty 5

## 2021-01-21 NOTE — Patient Instructions (Addendum)
The chemotherapy medication bag should finish at 46 hours, 96 hours, or 7 days. For example, if your pump is scheduled for 46 hours and it was put on at 4:00 p.m., it should finish at 2:00 p.m. the day it is scheduled to come off regardless of your appointment time.     Estimated time to finish at 10:30am on Wednesday January 11th, 2023. If the display on your pump reads "Low Volume" and it is beeping, take the batteries out of the pump and come to the cancer center for it to be taken off.   If the pump alarms go off prior to the pump reading "Low Volume" then call [phone] and someone can assist you.  If the plunger comes out and the chemotherapy medication is leaking out, please use your home chemo spill kit to clean up the spill. Do NOT use paper towels or other household products.  If you have problems or questions regarding your pump, please call either [phone] (24 hours a day) or the cancer center Monday-Friday 8:00 a.m.- 4:30 p.m. at the clinic number and we will assist you. If you are unable to get assistance, then go to the nearest Emergency Department and ask the staff to contact the IV team for assistance.  [facility]  Discharge Instructions: Thank you for choosing [facility] to provide your oncology and hematology care.   If you have a lab appointment with the [facility], please go directly to the [facility] and check in at the registration area.   Wear comfortable clothing and clothing appropriate for easy access to any Portacath or PICC line.   We strive to give you quality time with your provider. You may need to reschedule your appointment if you arrive late (15 or more minutes).  Arriving late affects you and other patients whose appointments are after yours.  Also, if you miss three or more appointments without notifying the office, you may be dismissed from the clinic at the providers discretion.      For  prescription refill requests, have your pharmacy contact our office and allow 72 hours for refills to be completed.    Today you received the following chemotherapy and/or immunotherapy agents panitumumab, irinotecan, leucovorin, fluorouracil    To help prevent nausea and vomiting after your treatment, we encourage you to take your nausea medication as directed.  BELOW ARE SYMPTOMS THAT SHOULD BE REPORTED IMMEDIATELY: *FEVER GREATER THAN 100.4 F (38 C) OR HIGHER *CHILLS OR SWEATING *NAUSEA AND VOMITING THAT IS NOT CONTROLLED WITH YOUR NAUSEA MEDICATION *UNUSUAL SHORTNESS OF BREATH *UNUSUAL BRUISING OR BLEEDING *URINARY PROBLEMS (pain or burning when urinating, or frequent urination) *BOWEL PROBLEMS (unusual diarrhea, constipation, pain near the anus) TENDERNESS IN MOUTH AND THROAT WITH OR WITHOUT PRESENCE OF ULCERS (sore throat, sores in mouth, or a toothache) UNUSUAL RASH, SWELLING OR PAIN  UNUSUAL VAGINAL DISCHARGE OR ITCHING   Items with * indicate a potential emergency and should be followed up as soon as possible or go to the Emergency Department if any problems should occur.  Please show the CHEMOTHERAPY ALERT CARD or IMMUNOTHERAPY ALERT CARD at check-in to the Emergency Department and triage nurse.  Should you have questions after your visit or need to cancel or reschedule your appointment, please contact [facility]  Dept: [phone]  and follow the prompts.  Office hours are 8:00 a.m. to 4:30 p.m. Monday - Friday. Please note that voicemails left after 4:00  p.m. may not be returned until the following business day.  We are closed weekends and major holidays. You have access to a nurse at all times for urgent questions. Please call the main number to the clinic Dept: [phone] and follow the prompts.   For any non-urgent questions, you may also contact your provider using MyChart. We now offer e-Visits for anyone 74 and older to request care online  for non-urgent symptoms. For details visit [email or web address].   Also download the MyChart app! Go to the app store, search "MyChart", open the app, select [facility], and log in with your MyChart username and password.  Due to Covid, a mask is required upon entering the hospital/clinic. If you do not have a mask, one will be given to you upon arrival. For doctor visits, patients may have 1 support person aged 78 or older with them. For treatment visits, patients cannot have anyone with them due to current Covid guidelines and our immunocompromised population.   Panitumumab Solution for Injection What is this medication? PANITUMUMAB (pan i TOOM ue mab) is a monoclonal antibody. It is used to treat colorectal cancer. This medicine may be used for other purposes; ask your health care provider or pharmacist if you have questions. COMMON BRAND NAME(S): Vectibix What should I tell my care team before I take this medication? They need to know if you have any of these conditions: eye disease, vision problems low levels of calcium, magnesium, or potassium in the blood lung or breathing disease, like asthma skin conditions or sensitivity an unusual or allergic reaction to panitumumab, other medicines, foods, dyes, or preservatives pregnant or trying to get pregnant breast-feeding How should I use this medication? This drug is given as an infusion into a vein. It is administered in a hospital or clinic by a specially trained health care professional. Talk to your pediatrician regarding the use of this medicine in children. Special care may be needed. Overdosage: If you think you have taken too much of this medicine contact a poison control center or emergency room at once. NOTE: This medicine is only for you. Do not share this medicine with others. What if I miss a dose? It is important not to miss your dose. Call your doctor or health care professional if you are unable to keep an  appointment. What may interact with this medication? Do not take this medicine with any of the following medications: bevacizumab This list may not describe all possible interactions. Give your health care provider a list of all the medicines, herbs, non-prescription drugs, or dietary supplements you use. Also tell them if you smoke, drink alcohol, or use illegal drugs. Some items may interact with your medicine. What should I watch for while using this medication? Visit your doctor for checks on your progress. This drug may make you feel generally unwell. This is not uncommon, as chemotherapy can affect healthy cells as well as cancer cells. Report any side effects. Continue your course of treatment even though you feel ill unless your doctor tells you to stop. This medicine can make you more sensitive to the sun. Keep out of the sun while receiving this medicine and for 2 months after the last dose. If you cannot avoid being in the sun, wear protective clothing and use sunscreen. Do not use sun lamps or tanning beds/booths. In some cases, you may be given additional medicines to help with side effects. Follow all directions for their use. Call your doctor or health care professional  for advice if you get a fever, chills or sore throat, or other symptoms of a cold or flu. Do not treat yourself. This drug decreases your body's ability to fight infections. Try to avoid being around people who are sick. Avoid taking products that contain aspirin, acetaminophen, ibuprofen, naproxen, or ketoprofen unless instructed by your doctor. These medicines may hide a fever. Do not become pregnant while taking this medicine and for 2 months after the last dose. Women should inform their doctor if they wish to become pregnant or think they might be pregnant. There is a potential for serious side effects to an unborn child. Talk to your health care professional or pharmacist for more information. Do not breast-feed an  infant while taking this medicine or for 2 months after the last dose. What side effects may I notice from receiving this medication? Side effects that you should report to your doctor or health care professional as soon as possible: allergic reactions like skin rash, itching or hives, swelling of the face, lips, or tongue breathing problems changes in vision eye pain fast, irregular heartbeat fever, chills mouth sores red spots on the skin redness, blistering, peeling or loosening of the skin, including inside the mouth signs and symptoms of kidney injury like trouble passing urine or change in the amount of urine signs and symptoms of low blood pressure like dizziness; feeling faint or lightheaded, falls; unusually weak or tired signs of low calcium like fast heartbeat, muscle cramps or muscle pain; pain, tingling, numbness in the hands or feet; seizures signs and symptoms of low magnesium like muscle cramps, pain, or weakness; tremors; seizures; or fast, irregular heartbeat signs and symptoms of low potassium like muscle cramps or muscle pain; chest pain; dizziness; feeling faint or lightheaded, falls; palpitations; breathing problems; or fast, irregular heartbeat swelling of the ankles, feet, hands Side effects that usually do not require medical attention (report to your doctor or health care professional if they continue or are bothersome): changes in skin like acne, cracks, skin dryness diarrhea eyelash growth headache mouth sores nail changes nausea, vomiting This list may not describe all possible side effects. Call your doctor for medical advice about side effects. You may report side effects to FDA at 1-800-FDA-1088. Where should I keep my medication? This drug is given in a hospital or clinic and will not be stored at home. NOTE: This sheet is a summary. It may not cover all possible information. If you have questions about this medicine, talk to your doctor, pharmacist, or  health care provider.  2022 Elsevier/Gold Standard (2015-07-20 16:45:04)  Irinotecan injection What is this medication? IRINOTECAN (ir in oh TEE kan ) is a chemotherapy drug. It is used to treat colon and rectal cancer. This medicine may be used for other purposes; ask your health care provider or pharmacist if you have questions. COMMON BRAND NAME(S): Camptosar What should I tell my care team before I take this medication? They need to know if you have any of these conditions: dehydration diarrhea infection (especially a virus infection such as chickenpox, cold sores, or herpes) liver disease low blood counts, like low white cell, platelet, or red cell counts low levels of calcium, magnesium, or potassium in the blood recent or ongoing radiation therapy an unusual or allergic reaction to irinotecan, other medicines, foods, dyes, or preservatives pregnant or trying to get pregnant breast-feeding How should I use this medication? This drug is given as an infusion into a vein. It is administered in a  hospital or clinic by a specially trained health care professional. Talk to your pediatrician regarding the use of this medicine in children. Special care may be needed. Overdosage: If you think you have taken too much of this medicine contact a poison control center or emergency room at once. NOTE: This medicine is only for you. Do not share this medicine with others. What if I miss a dose? It is important not to miss your dose. Call your doctor or health care professional if you are unable to keep an appointment. What may interact with this medication? Do not take this medicine with any of the following medications: cobicistat itraconazole This medicine may interact with the following medications: antiviral medicines for HIV or AIDS certain antibiotics like rifampin or rifabutin certain medicines for fungal infections like ketoconazole, posaconazole, and voriconazole certain medicines  for seizures like carbamazepine, phenobarbital, phenotoin clarithromycin gemfibrozil nefazodone St. John's Wort This list may not describe all possible interactions. Give your health care provider a list of all the medicines, herbs, non-prescription drugs, or dietary supplements you use. Also tell them if you smoke, drink alcohol, or use illegal drugs. Some items may interact with your medicine. What should I watch for while using this medication? Your condition will be monitored carefully while you are receiving this medicine. You will need important blood work done while you are taking this medicine. This drug may make you feel generally unwell. This is not uncommon, as chemotherapy can affect healthy cells as well as cancer cells. Report any side effects. Continue your course of treatment even though you feel ill unless your doctor tells you to stop. In some cases, you may be given additional medicines to help with side effects. Follow all directions for their use. You may get drowsy or dizzy. Do not drive, use machinery, or do anything that needs mental alertness until you know how this medicine affects you. Do not stand or sit up quickly, especially if you are an older patient. This reduces the risk of dizzy or fainting spells. Call your health care professional for advice if you get a fever, chills, or sore throat, or other symptoms of a cold or flu. Do not treat yourself. This medicine decreases your body's ability to fight infections. Try to avoid being around people who are sick. Avoid taking products that contain aspirin, acetaminophen, ibuprofen, naproxen, or ketoprofen unless instructed by your doctor. These medicines may hide a fever. This medicine may increase your risk to bruise or bleed. Call your doctor or health care professional if you notice any unusual bleeding. Be careful brushing and flossing your teeth or using a toothpick because you may get an infection or bleed more easily.  If you have any dental work done, tell your dentist you are receiving this medicine. Do not become pregnant while taking this medicine or for 6 months after stopping it. Women should inform their health care professional if they wish to become pregnant or think they might be pregnant. Men should not father a child while taking this medicine and for 3 months after stopping it. There is potential for serious side effects to an unborn child. Talk to your health care professional for more information. Do not breast-feed an infant while taking this medicine or for 7 days after stopping it. This medicine has caused ovarian failure in some women. This medicine may make it more difficult to get pregnant. Talk to your health care professional if you are concerned about your fertility. This medicine has caused  decreased sperm counts in some men. This may make it more difficult to father a child. Talk to your health care professional if you are concerned about your fertility. What side effects may I notice from receiving this medication? Side effects that you should report to your doctor or health care professional as soon as possible: allergic reactions like skin rash, itching or hives, swelling of the face, lips, or tongue chest pain diarrhea flushing, runny nose, sweating during infusion low blood counts - this medicine may decrease the number of white blood cells, red blood cells and platelets. You may be at increased risk for infections and bleeding. nausea, vomiting pain, swelling, warmth in the leg signs of decreased platelets or bleeding - bruising, pinpoint red spots on the skin, black, tarry stools, blood in the urine signs of infection - fever or chills, cough, sore throat, pain or difficulty passing urine signs of decreased red blood cells - unusually weak or tired, fainting spells, lightheadedness Side effects that usually do not require medical attention (report to your doctor or health care  professional if they continue or are bothersome): constipation hair loss headache loss of appetite mouth sores stomach pain This list may not describe all possible side effects. Call your doctor for medical advice about side effects. You may report side effects to FDA at 1-800-FDA-1088. Where should I keep my medication? This drug is given in a hospital or clinic and will not be stored at home. NOTE: This sheet is a summary. It may not cover all possible information. If you have questions about this medicine, talk to your doctor, pharmacist, or health care provider.  2022 Elsevier/Gold Standard (2018-11-30 17:46:13)  Leucovorin injection What is this medication? LEUCOVORIN (loo koe VOR in) is used to prevent or treat the harmful effects of some medicines. This medicine is used to treat anemia caused by a low amount of folic acid in the body. It is also used with 5-fluorouracil (5-FU) to treat colon cancer. This medicine may be used for other purposes; ask your health care provider or pharmacist if you have questions. What should I tell my care team before I take this medication? They need to know if you have any of these conditions: anemia from low levels of vitamin B-12 in the blood an unusual or allergic reaction to leucovorin, folic acid, other medicines, foods, dyes, or preservatives pregnant or trying to get pregnant breast-feeding How should I use this medication? This medicine is for injection into a muscle or into a vein. It is given by a health care professional in a hospital or clinic setting. Talk to your pediatrician regarding the use of this medicine in children. Special care may be needed. Overdosage: If you think you have taken too much of this medicine contact a poison control center or emergency room at once. NOTE: This medicine is only for you. Do not share this medicine with others. What if I miss a dose? This does not apply. What may interact with this  medication? capecitabine fluorouracil phenobarbital phenytoin primidone trimethoprim-sulfamethoxazole This list may not describe all possible interactions. Give your health care provider a list of all the medicines, herbs, non-prescription drugs, or dietary supplements you use. Also tell them if you smoke, drink alcohol, or use illegal drugs. Some items may interact with your medicine. What should I watch for while using this medication? Your condition will be monitored carefully while you are receiving this medicine. This medicine may increase the side effects of 5-fluorouracil, 5-FU. Tell your  doctor or health care professional if you have diarrhea or mouth sores that do not get better or that get worse. What side effects may I notice from receiving this medication? Side effects that you should report to your doctor or health care professional as soon as possible: allergic reactions like skin rash, itching or hives, swelling of the face, lips, or tongue breathing problems fever, infection mouth sores unusual bleeding or bruising unusually weak or tired Side effects that usually do not require medical attention (report to your doctor or health care professional if they continue or are bothersome): constipation or diarrhea loss of appetite nausea, vomiting This list may not describe all possible side effects. Call your doctor for medical advice about side effects. You may report side effects to FDA at 1-800-FDA-1088. Where should I keep my medication? This drug is given in a hospital or clinic and will not be stored at home. NOTE: This sheet is a summary. It may not cover all possible information. If you have questions about this medicine, talk to your doctor, pharmacist, or health care provider.  2022 Elsevier/Gold Standard (2007-07-06 16:50:29)  Fluorouracil, 5-FU injection What is this medication? FLUOROURACIL, 5-FU (flure oh YOOR a sil) is a chemotherapy drug. It slows the growth  of cancer cells. This medicine is used to treat many types of cancer like breast cancer, colon or rectal cancer, pancreatic cancer, and stomach cancer. This medicine may be used for other purposes; ask your health care provider or pharmacist if you have questions. COMMON BRAND NAME(S): Adrucil What should I tell my care team before I take this medication? They need to know if you have any of these conditions: blood disorders dihydropyrimidine dehydrogenase (DPD) deficiency infection (especially a virus infection such as chickenpox, cold sores, or herpes) kidney disease liver disease malnourished, poor nutrition recent or ongoing radiation therapy an unusual or allergic reaction to fluorouracil, other chemotherapy, other medicines, foods, dyes, or preservatives pregnant or trying to get pregnant breast-feeding How should I use this medication? This drug is given as an infusion or injection into a vein. It is administered in a hospital or clinic by a specially trained health care professional. Talk to your pediatrician regarding the use of this medicine in children. Special care may be needed. Overdosage: If you think you have taken too much of this medicine contact a poison control center or emergency room at once. NOTE: This medicine is only for you. Do not share this medicine with others. What if I miss a dose? It is important not to miss your dose. Call your doctor or health care professional if you are unable to keep an appointment. What may interact with this medication? Do not take this medicine with any of the following medications: live virus vaccines This medicine may also interact with the following medications: medicines that treat or prevent blood clots like warfarin, enoxaparin, and dalteparin This list may not describe all possible interactions. Give your health care provider a list of all the medicines, herbs, non-prescription drugs, or dietary supplements you use. Also tell  them if you smoke, drink alcohol, or use illegal drugs. Some items may interact with your medicine. What should I watch for while using this medication? Visit your doctor for checks on your progress. This drug may make you feel generally unwell. This is not uncommon, as chemotherapy can affect healthy cells as well as cancer cells. Report any side effects. Continue your course of treatment even though you feel ill unless your doctor tells  you to stop. In some cases, you may be given additional medicines to help with side effects. Follow all directions for their use. Call your doctor or health care professional for advice if you get a fever, chills or sore throat, or other symptoms of a cold or flu. Do not treat yourself. This drug decreases your body's ability to fight infections. Try to avoid being around people who are sick. This medicine may increase your risk to bruise or bleed. Call your doctor or health care professional if you notice any unusual bleeding. Be careful brushing and flossing your teeth or using a toothpick because you may get an infection or bleed more easily. If you have any dental work done, tell your dentist you are receiving this medicine. Avoid taking products that contain aspirin, acetaminophen, ibuprofen, naproxen, or ketoprofen unless instructed by your doctor. These medicines may hide a fever. Do not become pregnant while taking this medicine. Women should inform their doctor if they wish to become pregnant or think they might be pregnant. There is a potential for serious side effects to an unborn child. Talk to your health care professional or pharmacist for more information. Do not breast-feed an infant while taking this medicine. Men should inform their doctor if they wish to father a child. This medicine may lower sperm counts. Do not treat diarrhea with over the counter products. Contact your doctor if you have diarrhea that lasts more than 2 days or if it is severe and  watery. This medicine can make you more sensitive to the sun. Keep out of the sun. If you cannot avoid being in the sun, wear protective clothing and use sunscreen. Do not use sun lamps or tanning beds/booths. What side effects may I notice from receiving this medication? Side effects that you should report to your doctor or health care professional as soon as possible: allergic reactions like skin rash, itching or hives, swelling of the face, lips, or tongue low blood counts - this medicine may decrease the number of white blood cells, red blood cells and platelets. You may be at increased risk for infections and bleeding. signs of infection - fever or chills, cough, sore throat, pain or difficulty passing urine signs of decreased platelets or bleeding - bruising, pinpoint red spots on the skin, black, tarry stools, blood in the urine signs of decreased red blood cells - unusually weak or tired, fainting spells, lightheadedness breathing problems changes in vision chest pain mouth sores nausea and vomiting pain, swelling, redness at site where injected pain, tingling, numbness in the hands or feet redness, swelling, or sores on hands or feet stomach pain unusual bleeding Side effects that usually do not require medical attention (report to your doctor or health care professional if they continue or are bothersome): changes in finger or toe nails diarrhea dry or itchy skin hair loss headache loss of appetite sensitivity of eyes to the light stomach upset unusually teary eyes This list may not describe all possible side effects. Call your doctor for medical advice about side effects. You may report side effects to FDA at 1-800-FDA-1088. Where should I keep my medication? This drug is given in a hospital or clinic and will not be stored at home. NOTE: This sheet is a summary. It may not cover all possible information. If you have questions about this medicine, talk to your doctor,  pharmacist, or health care provider.  2022 Elsevier/Gold Standard (2018-11-30 15:00:03)  Magnesium Sulfate Injection What is this medication? MAGNESIUM SULFATE (  mag NEE zee um SUL fate) prevents and treats low levels of magnesium in your body. It may also be used to prevent and treat seizures during pregnancy in people with high blood pressure disorders, such as preeclampsia or eclampsia. Magnesium plays an important role in maintaining the health of your muscles and nervous system. This medicine may be used for other purposes; ask your health care provider or pharmacist if you have questions. What should I tell my care team before I take this medication? They need to know if you have any of these conditions: Heart disease History of irregular heart beat Kidney disease An unusual or allergic reaction to magnesium sulfate, medications, foods, dyes, or preservatives Pregnant or trying to get pregnant Breast-feeding How should I use this medication? This medication is for infusion into a vein. It is given in a hospital or clinic setting. Talk to your care team about the use of this medication in children. While this medication may be prescribed for selected conditions, precautions do apply. Overdosage: If you think you have taken too much of this medicine contact a poison control center or emergency room at once. NOTE: This medicine is only for you. Do not share this medicine with others. What if I miss a dose? This does not apply. What may interact with this medication? Certain medications for anxiety or sleep Certain medications for seizures like phenobarbital Digoxin Medications that relax muscles for surgery Narcotic medications for pain This list may not describe all possible interactions. Give your health care provider a list of all the medicines, herbs, non-prescription drugs, or dietary supplements you use. Also tell them if you smoke, drink alcohol, or use illegal drugs. Some items  may interact with your medicine. What should I watch for while using this medication? Your condition will be monitored carefully while you are receiving this medication. You may need blood work done while you are receiving this medication. What side effects may I notice from receiving this medication? Side effects that you should report to your care team as soon as possible: Allergic reactions--skin rash, itching, hives, swelling of the face, lips, tongue, or throat High magnesium level--confusion, drowsiness, facial flushing, redness, sweating, muscle weakness, fast or irregular heartbeat, trouble breathing Low blood pressure--dizziness, feeling faint or lightheaded, blurry vision Side effects that usually do not require medical attention (report to your care team if they continue or are bothersome): Headache Nausea This list may not describe all possible side effects. Call your doctor for medical advice about side effects. You may report side effects to FDA at 1-800-FDA-1088. Where should I keep my medication? This medication is given in a hospital or clinic and will not be stored at home. NOTE: This sheet is a summary. It may not cover all possible information. If you have questions about this medicine, talk to your doctor, pharmacist, or health care provider.  2022 Elsevier/Gold Standard (2020-03-15 00:00:00)

## 2021-01-21 NOTE — Progress Notes (Signed)
Patient presents for treatment. RN assessment completed along with the following:  Labs/vitals reviewed - Yes, and Per Dr. Benay Spice, ok to treat with labs from 01/16/21 and with BP 138/100.    Weight within 10% of previous measurement - Yes Oncology Treatment Attestation completed for current therapy- Yes, on date 09/07/20 Informed consent completed and reflects current therapy/intent - Yes, on date 08/19/20             Provider progress note reviewed - Patient not seen by provider today. Most recent note dated 01/16/21 reviewed. Treatment/Antibody/Supportive plan reviewed - Yes, and there are no adjustments needed for today's treatment. S&H and other orders reviewed - Yes, and there are no additional orders identified. Previous treatment date reviewed - Yes, and the appropriate amount of time has elapsed between treatments.  Patient to proceed with treatment.

## 2021-01-23 ENCOUNTER — Inpatient Hospital Stay: Payer: Medicaid Other

## 2021-01-23 ENCOUNTER — Other Ambulatory Visit: Payer: Self-pay

## 2021-01-23 VITALS — BP 152/98 | HR 65 | Temp 98.1°F | Resp 18

## 2021-01-23 DIAGNOSIS — C19 Malignant neoplasm of rectosigmoid junction: Secondary | ICD-10-CM

## 2021-01-23 DIAGNOSIS — Z5111 Encounter for antineoplastic chemotherapy: Secondary | ICD-10-CM | POA: Diagnosis not present

## 2021-01-23 MED ORDER — SODIUM CHLORIDE 0.9% FLUSH
10.0000 mL | INTRAVENOUS | Status: DC | PRN
  Administered 2021-01-23: 10 mL

## 2021-01-23 MED ORDER — HEPARIN SOD (PORK) LOCK FLUSH 100 UNIT/ML IV SOLN
250.0000 [IU] | Freq: Once | INTRAVENOUS | Status: AC | PRN
  Administered 2021-01-23: 250 [IU]

## 2021-01-23 NOTE — Patient Instructions (Signed)
PICC Home Care Guide A peripherally inserted central catheter (PICC) is a form of IV access that allows medicines and IV fluids to be quickly put into the blood and spread throughout the body. The PICC is a long, thin, flexible tube (catheter) that is put into a vein in a person's arm or leg. The catheter ends in a large vein just outside the heart called the superior vena cava (SVC). After the PICC is put in, a chest X-ray may be done to make sure that it is in the right place. A PICC may be placed for different reasons, such as: To give medicines and liquid nutrition. To give IV fluids and blood products. To take blood samples often. If there is trouble placing a peripheral intravenous (PIV) catheter. If cared for properly, a PICC can remain in place for many months. Having a PICC can allow you to go home from the hospital sooner and continue treatment at home. Medicines and PICC care can be managed at home by a family member, caregiver, or home health care team. What are the risks? Generally, having a PICC is safe. However, problems may occur, including: A blood clot (thrombus) forming in or at the end of the PICC. A blood clot forming in a vein (deep vein thrombosis) or traveling to the lung (pulmonary embolism). Inflammation of the vein (phlebitis) in which the PICC is placed. Infection at the insertion site or in the blood. Blood infections from central lines, like PICCs, can be serious and often require a hospital stay. PICC malposition, or PICC movement or poor placement. A break or cut in the PICC. Do not use scissors near the PICC. Nerve or tendon irritation or injury during PICC insertion. How to care for your PICC Please follow the specific guidelines provided by your health care provider. Preventing infection You and any caregivers should wash your hands often with soap and water for at least 20 seconds. Wash hands: Before touching the PICC or the infusion device. Before changing a  bandage (dressing). Do not change the dressing unless you have been taught to do so and have shown you are able to change it safely. Flush the PICC as told. Tell your health care provider right away if the PICC is hard to flush or does not flush. Do not use force to flush the PICC. Use clean and germ-free (sterile) supplies only. Keep the supplies in a dry place. Do not reuse needles, syringes, or any other supplies. Reusing supplies can lead to infection. Keep the PICC dressing dry and secure it with tape if the edges stop sticking to your skin. Check your PICC insertion site every day for signs of infection. Check for: Redness, swelling, or pain. Fluid or blood. Warmth. Pus or a bad smell. Preventing other problems Do not use a syringe that is less than 10 mL to flush the PICC. Do not have your blood pressure checked on the arm in which the PICC is placed. Do not ever pull or tug on the PICC. Keep it secured to your arm with tape or a stretch wrap when not in use. Do not take the PICC out yourself. Only a trained health care provider should remove the PICC. Keep pets and children away from your PICC. How to care for your PICC dressing Keep your PICC dressing clean and dry to prevent infection. Do not take baths, swim, or use a hot tub until your health care provider approves. Ask your health care provider if you can take   showers. You may only be allowed to take sponge baths. When you are allowed to shower: Ask your health care provider to teach you how to wrap the PICC. Cover the PICC with clear plastic wrap and tape to keep it dry while showering. Follow instructions from your health care provider about how to take care of your insertion site and dressing. Make sure you: Wash your hands with soap and water for at least 20 seconds before and after you change your dressing. If soap and water are not available, use hand sanitizer. Change your dressing only if taught to do so by your health care  provider. Your PICC dressing needs to be changed if it becomes loose or wet. Leave stitches (sutures), skin glue, or adhesive strips in place. These skin closures may need to stay in place for 2 weeks or longer. If adhesive strip edges start to loosen and curl up, you may trim the loose edges. Do not remove adhesive strips completely unless your health care provider tells you to do that. Follow these instructions at home: Disposal of supplies Throw away any syringes in a disposal container that is meant for sharp items (sharps container). You can buy a sharps container from a pharmacy, or you can make one by using an empty, hard plastic bottle with a lid. Place any used dressings or infusion bags into a plastic bag. Throw that bag in the trash. General instructions  Always carry your PICC identification card or wear a medical alert bracelet. Keep the tube clamped at all times, unless it is being used. Always carry a smooth-edge clamp with you to clamp the PICC if it breaks. Do not use scissors or sharp objects near the tube. You may bend your arm and move it freely. If your PICC is near or at the bend of your elbow, avoid activity with repeated motion at the elbow. Avoid lifting heavy objects as told by your health care provider. Keep all follow-up visits. This is important. You will need to have your PICC dressing changed at least once a week. Contact a health care provider if: You have pain in your arm, ear, face, or teeth. You have a fever or chills. You have redness, swelling, or pain around the insertion site. You have fluid or blood coming from the insertion site. Your insertion site feels warm to the touch. You have pus or a bad smell coming from the insertion site. Your skin feels hard and raised around the insertion site. Your PICC dressing has gotten wet or is coming off and you have not been taught how to change it. Get help right away if: You have problems with your PICC, such as  your PICC: Was tugged or pulled and has partially come out. Do not  push the PICC back in. Cannot be flushed, is hard to flush, or leaks around the insertion site when it is flushed. Makes a flushing sound when it is flushed. Appears to have a hole or tear. Is accidentally pulled all the way out. If this happens, cover the insertion site with a gauze dressing. Do not throw the PICC away. Your health care provider will need to check it to be sure the entire catheter came out. You feel your heart racing or skipping beats, or you have chest pain. You have shortness of breath or trouble breathing. You have swelling, redness, warmth, or pain in the arm in which the PICC is placed. You have a red streak going up your arm that   starts under the PICC dressing. These symptoms may be an emergency. Get help right away. Call 911. Do not wait to see if the symptoms will go away. Do not drive yourself to the hospital. Summary A peripherally inserted central catheter (PICC) is a long, thin, flexible tube (catheter) that is put into a vein in the arm or leg. If cared for properly, a PICC can remain in place for many months. Having a PICC can allow you to go home from the hospital sooner and continue treatment at home. The PICC is inserted using a germ-free (sterile) technique by a specially trained health care provider. Only a trained health care provider should remove it. Do not have your blood pressure checked on the arm in which your PICC is placed. Always keep your PICC identification card with you. This information is not intended to replace advice given to you by your health care provider. Make sure you discuss any questions you have with your health care provider. Document Revised: 07/18/2020 Document Reviewed: 07/18/2020 Elsevier Patient Education  2022 [employer]  

## 2021-01-30 ENCOUNTER — Other Ambulatory Visit: Payer: Self-pay

## 2021-01-30 ENCOUNTER — Inpatient Hospital Stay: Payer: Medicaid Other

## 2021-01-30 VITALS — BP 148/100 | HR 81 | Temp 97.4°F | Resp 18

## 2021-01-30 DIAGNOSIS — C19 Malignant neoplasm of rectosigmoid junction: Secondary | ICD-10-CM

## 2021-01-30 DIAGNOSIS — Z95828 Presence of other vascular implants and grafts: Secondary | ICD-10-CM

## 2021-01-30 DIAGNOSIS — Z5111 Encounter for antineoplastic chemotherapy: Secondary | ICD-10-CM | POA: Diagnosis not present

## 2021-01-30 MED ORDER — HEPARIN SOD (PORK) LOCK FLUSH 100 UNIT/ML IV SOLN
500.0000 [IU] | Freq: Once | INTRAVENOUS | Status: AC
  Administered 2021-01-30: 500 [IU]

## 2021-01-30 MED ORDER — SODIUM CHLORIDE 0.9% FLUSH
10.0000 mL | Freq: Once | INTRAVENOUS | Status: AC
  Administered 2021-01-30: 10 mL

## 2021-01-30 NOTE — Patient Instructions (Signed)
PICC Home Care Guide A peripherally inserted central catheter (PICC) is a form of IV access that allows medicines and IV fluids to be quickly put into the blood and spread throughout the body. The PICC is a long, thin, flexible tube (catheter) that is put into a vein in a person's arm or leg. The catheter ends in a large vein just outside the heart called the superior vena cava (SVC). After the PICC is put in, a chest X-ray may be done to make sure that it is in the right place. A PICC may be placed for different reasons, such as: To give medicines and liquid nutrition. To give IV fluids and blood products. To take blood samples often. If there is trouble placing a peripheral intravenous (PIV) catheter. If cared for properly, a PICC can remain in place for many months. Having a PICC can allow you to go home from the hospital sooner and continue treatment at home. Medicines and PICC care can be managed at home by a family member, caregiver, or home health care team. What are the risks? Generally, having a PICC is safe. However, problems may occur, including: A blood clot (thrombus) forming in or at the end of the PICC. A blood clot forming in a vein (deep vein thrombosis) or traveling to the lung (pulmonary embolism). Inflammation of the vein (phlebitis) in which the PICC is placed. Infection at the insertion site or in the blood. Blood infections from central lines, like PICCs, can be serious and often require a hospital stay. PICC malposition, or PICC movement or poor placement. A break or cut in the PICC. Do not use scissors near the PICC. Nerve or tendon irritation or injury during PICC insertion. How to care for your PICC Please follow the specific guidelines provided by your health care provider. Preventing infection You and any caregivers should wash your hands often with soap and water for at least 20 seconds. Wash hands: Before touching the PICC or the infusion device. Before changing a  bandage (dressing). Do not change the dressing unless you have been taught to do so and have shown you are able to change it safely. Flush the PICC as told. Tell your health care provider right away if the PICC is hard to flush or does not flush. Do not use force to flush the PICC. Use clean and germ-free (sterile) supplies only. Keep the supplies in a dry place. Do not reuse needles, syringes, or any other supplies. Reusing supplies can lead to infection. Keep the PICC dressing dry and secure it with tape if the edges stop sticking to your skin. Check your PICC insertion site every day for signs of infection. Check for: Redness, swelling, or pain. Fluid or blood. Warmth. Pus or a bad smell. Preventing other problems Do not use a syringe that is less than 10 mL to flush the PICC. Do not have your blood pressure checked on the arm in which the PICC is placed. Do not ever pull or tug on the PICC. Keep it secured to your arm with tape or a stretch wrap when not in use. Do not take the PICC out yourself. Only a trained health care provider should remove the PICC. Keep pets and children away from your PICC. How to care for your PICC dressing Keep your PICC dressing clean and dry to prevent infection. Do not take baths, swim, or use a hot tub until your health care provider approves. Ask your health care provider if you can take   showers. You may only be allowed to take sponge baths. When you are allowed to shower: Ask your health care provider to teach you how to wrap the PICC. Cover the PICC with clear plastic wrap and tape to keep it dry while showering. Follow instructions from your health care provider about how to take care of your insertion site and dressing. Make sure you: Wash your hands with soap and water for at least 20 seconds before and after you change your dressing. If soap and water are not available, use hand sanitizer. Change your dressing only if taught to do so by your health care  provider. Your PICC dressing needs to be changed if it becomes loose or wet. Leave stitches (sutures), skin glue, or adhesive strips in place. These skin closures may need to stay in place for 2 weeks or longer. If adhesive strip edges start to loosen and curl up, you may trim the loose edges. Do not remove adhesive strips completely unless your health care provider tells you to do that. Follow these instructions at home: Disposal of supplies Throw away any syringes in a disposal container that is meant for sharp items (sharps container). You can buy a sharps container from a pharmacy, or you can make one by using an empty, hard plastic bottle with a lid. Place any used dressings or infusion bags into a plastic bag. Throw that bag in the trash. General instructions  Always carry your PICC identification card or wear a medical alert bracelet. Keep the tube clamped at all times, unless it is being used. Always carry a smooth-edge clamp with you to clamp the PICC if it breaks. Do not use scissors or sharp objects near the tube. You may bend your arm and move it freely. If your PICC is near or at the bend of your elbow, avoid activity with repeated motion at the elbow. Avoid lifting heavy objects as told by your health care provider. Keep all follow-up visits. This is important. You will need to have your PICC dressing changed at least once a week. Contact a health care provider if: You have pain in your arm, ear, face, or teeth. You have a fever or chills. You have redness, swelling, or pain around the insertion site. You have fluid or blood coming from the insertion site. Your insertion site feels warm to the touch. You have pus or a bad smell coming from the insertion site. Your skin feels hard and raised around the insertion site. Your PICC dressing has gotten wet or is coming off and you have not been taught how to change it. Get help right away if: You have problems with your PICC, such as  your PICC: Was tugged or pulled and has partially come out. Do not  push the PICC back in. Cannot be flushed, is hard to flush, or leaks around the insertion site when it is flushed. Makes a flushing sound when it is flushed. Appears to have a hole or tear. Is accidentally pulled all the way out. If this happens, cover the insertion site with a gauze dressing. Do not throw the PICC away. Your health care provider will need to check it to be sure the entire catheter came out. You feel your heart racing or skipping beats, or you have chest pain. You have shortness of breath or trouble breathing. You have swelling, redness, warmth, or pain in the arm in which the PICC is placed. You have a red streak going up your arm that   starts under the PICC dressing. These symptoms may be an emergency. Get help right away. Call 911. Do not wait to see if the symptoms will go away. Do not drive yourself to the hospital. Summary A peripherally inserted central catheter (PICC) is a long, thin, flexible tube (catheter) that is put into a vein in the arm or leg. If cared for properly, a PICC can remain in place for many months. Having a PICC can allow you to go home from the hospital sooner and continue treatment at home. The PICC is inserted using a germ-free (sterile) technique by a specially trained health care provider. Only a trained health care provider should remove it. Do not have your blood pressure checked on the arm in which your PICC is placed. Always keep your PICC identification card with you. This information is not intended to replace advice given to you by your health care provider. Make sure you discuss any questions you have with your health care provider. Document Revised: 07/18/2020 Document Reviewed: 07/18/2020 Elsevier Patient Education  2022 [employer]  

## 2021-02-06 ENCOUNTER — Other Ambulatory Visit: Payer: Self-pay

## 2021-02-06 ENCOUNTER — Inpatient Hospital Stay: Payer: Medicaid Other

## 2021-02-06 ENCOUNTER — Inpatient Hospital Stay: Payer: Medicaid Other | Admitting: Oncology

## 2021-02-06 VITALS — BP 124/92 | HR 80 | Temp 98.7°F | Resp 18

## 2021-02-06 DIAGNOSIS — Z5111 Encounter for antineoplastic chemotherapy: Secondary | ICD-10-CM | POA: Diagnosis not present

## 2021-02-06 DIAGNOSIS — C19 Malignant neoplasm of rectosigmoid junction: Secondary | ICD-10-CM

## 2021-02-06 DIAGNOSIS — Z95828 Presence of other vascular implants and grafts: Secondary | ICD-10-CM

## 2021-02-06 MED ORDER — SODIUM CHLORIDE 0.9% FLUSH
10.0000 mL | Freq: Once | INTRAVENOUS | Status: AC
  Administered 2021-02-06: 14:00:00 10 mL

## 2021-02-06 MED ORDER — HEPARIN SOD (PORK) LOCK FLUSH 100 UNIT/ML IV SOLN
500.0000 [IU] | Freq: Once | INTRAVENOUS | Status: AC
  Administered 2021-02-06: 14:00:00 500 [IU]

## 2021-02-06 NOTE — Patient Instructions (Signed)
PICC Home Care Guide A peripherally inserted central catheter (PICC) is a form of IV access that allows medicines and IV fluids to be quickly put into the blood and spread throughout the body. The PICC is a long, thin, flexible tube (catheter) that is put into a vein in a person's arm or leg. The catheter ends in a large vein just outside the heart called the superior vena cava (SVC). After the PICC is put in, a chest X-ray may be done to make sure that it is in the right place. A PICC may be placed for different reasons, such as: To give medicines and liquid nutrition. To give IV fluids and blood products. To take blood samples often. If there is trouble placing a peripheral intravenous (PIV) catheter. If cared for properly, a PICC can remain in place for many months. Having a PICC can allow you to go home from the hospital sooner and continue treatment at home. Medicines and PICC care can be managed at home by a family member, caregiver, or home health care team. What are the risks? Generally, having a PICC is safe. However, problems may occur, including: A blood clot (thrombus) forming in or at the end of the PICC. A blood clot forming in a vein (deep vein thrombosis) or traveling to the lung (pulmonary embolism). Inflammation of the vein (phlebitis) in which the PICC is placed. Infection at the insertion site or in the blood. Blood infections from central lines, like PICCs, can be serious and often require a hospital stay. PICC malposition, or PICC movement or poor placement. A break or cut in the PICC. Do not use scissors near the PICC. Nerve or tendon irritation or injury during PICC insertion. How to care for your PICC Please follow the specific guidelines provided by your health care provider. Preventing infection You and any caregivers should wash your hands often with soap and water for at least 20 seconds. Wash hands: Before touching the PICC or the infusion device. Before changing a  bandage (dressing). Do not change the dressing unless you have been taught to do so and have shown you are able to change it safely. Flush the PICC as told. Tell your health care provider right away if the PICC is hard to flush or does not flush. Do not use force to flush the PICC. Use clean and germ-free (sterile) supplies only. Keep the supplies in a dry place. Do not reuse needles, syringes, or any other supplies. Reusing supplies can lead to infection. Keep the PICC dressing dry and secure it with tape if the edges stop sticking to your skin. Check your PICC insertion site every day for signs of infection. Check for: Redness, swelling, or pain. Fluid or blood. Warmth. Pus or a bad smell. Preventing other problems Do not use a syringe that is less than 10 mL to flush the PICC. Do not have your blood pressure checked on the arm in which the PICC is placed. Do not ever pull or tug on the PICC. Keep it secured to your arm with tape or a stretch wrap when not in use. Do not take the PICC out yourself. Only a trained health care provider should remove the PICC. Keep pets and children away from your PICC. How to care for your PICC dressing Keep your PICC dressing clean and dry to prevent infection. Do not take baths, swim, or use a hot tub until your health care provider approves. Ask your health care provider if you can take   showers. You may only be allowed to take sponge baths. When you are allowed to shower: Ask your health care provider to teach you how to wrap the PICC. Cover the PICC with clear plastic wrap and tape to keep it dry while showering. Follow instructions from your health care provider about how to take care of your insertion site and dressing. Make sure you: Wash your hands with soap and water for at least 20 seconds before and after you change your dressing. If soap and water are not available, use hand sanitizer. Change your dressing only if taught to do so by your health care  provider. Your PICC dressing needs to be changed if it becomes loose or wet. Leave stitches (sutures), skin glue, or adhesive strips in place. These skin closures may need to stay in place for 2 weeks or longer. If adhesive strip edges start to loosen and curl up, you may trim the loose edges. Do not remove adhesive strips completely unless your health care provider tells you to do that. Follow these instructions at home: Disposal of supplies Throw away any syringes in a disposal container that is meant for sharp items (sharps container). You can buy a sharps container from a pharmacy, or you can make one by using an empty, hard plastic bottle with a lid. Place any used dressings or infusion bags into a plastic bag. Throw that bag in the trash. General instructions  Always carry your PICC identification card or wear a medical alert bracelet. Keep the tube clamped at all times, unless it is being used. Always carry a smooth-edge clamp with you to clamp the PICC if it breaks. Do not use scissors or sharp objects near the tube. You may bend your arm and move it freely. If your PICC is near or at the bend of your elbow, avoid activity with repeated motion at the elbow. Avoid lifting heavy objects as told by your health care provider. Keep all follow-up visits. This is important. You will need to have your PICC dressing changed at least once a week. Contact a health care provider if: You have pain in your arm, ear, face, or teeth. You have a fever or chills. You have redness, swelling, or pain around the insertion site. You have fluid or blood coming from the insertion site. Your insertion site feels warm to the touch. You have pus or a bad smell coming from the insertion site. Your skin feels hard and raised around the insertion site. Your PICC dressing has gotten wet or is coming off and you have not been taught how to change it. Get help right away if: You have problems with your PICC, such as  your PICC: Was tugged or pulled and has partially come out. Do not  push the PICC back in. Cannot be flushed, is hard to flush, or leaks around the insertion site when it is flushed. Makes a flushing sound when it is flushed. Appears to have a hole or tear. Is accidentally pulled all the way out. If this happens, cover the insertion site with a gauze dressing. Do not throw the PICC away. Your health care provider will need to check it to be sure the entire catheter came out. You feel your heart racing or skipping beats, or you have chest pain. You have shortness of breath or trouble breathing. You have swelling, redness, warmth, or pain in the arm in which the PICC is placed. You have a red streak going up your arm that   starts under the PICC dressing. These symptoms may be an emergency. Get help right away. Call 911. Do not wait to see if the symptoms will go away. Do not drive yourself to the hospital. Summary A peripherally inserted central catheter (PICC) is a long, thin, flexible tube (catheter) that is put into a vein in the arm or leg. If cared for properly, a PICC can remain in place for many months. Having a PICC can allow you to go home from the hospital sooner and continue treatment at home. The PICC is inserted using a germ-free (sterile) technique by a specially trained health care provider. Only a trained health care provider should remove it. Do not have your blood pressure checked on the arm in which your PICC is placed. Always keep your PICC identification card with you. This information is not intended to replace advice given to you by your health care provider. Make sure you discuss any questions you have with your health care provider. Document Revised: 07/18/2020 Document Reviewed: 07/18/2020 Elsevier Patient Education  2022 [employer]  

## 2021-02-08 ENCOUNTER — Inpatient Hospital Stay: Payer: Medicaid Other

## 2021-02-10 ENCOUNTER — Other Ambulatory Visit: Payer: Self-pay | Admitting: Oncology

## 2021-02-11 ENCOUNTER — Inpatient Hospital Stay: Payer: Medicaid Other

## 2021-02-11 ENCOUNTER — Inpatient Hospital Stay (HOSPITAL_BASED_OUTPATIENT_CLINIC_OR_DEPARTMENT_OTHER): Payer: Medicaid Other | Admitting: Oncology

## 2021-02-11 ENCOUNTER — Other Ambulatory Visit: Payer: Self-pay

## 2021-02-11 VITALS — BP 150/90 | HR 93 | Temp 97.8°F | Resp 18 | Ht 67.0 in | Wt 173.6 lb

## 2021-02-11 VITALS — BP 148/91 | HR 91 | Temp 97.9°F | Resp 17

## 2021-02-11 DIAGNOSIS — C19 Malignant neoplasm of rectosigmoid junction: Secondary | ICD-10-CM | POA: Diagnosis not present

## 2021-02-11 DIAGNOSIS — Z5111 Encounter for antineoplastic chemotherapy: Secondary | ICD-10-CM | POA: Diagnosis not present

## 2021-02-11 LAB — CBC WITH DIFFERENTIAL (CANCER CENTER ONLY)
Abs Immature Granulocytes: 0.01 10*3/uL (ref 0.00–0.07)
Basophils Absolute: 0 10*3/uL (ref 0.0–0.1)
Basophils Relative: 1 %
Eosinophils Absolute: 0.4 10*3/uL (ref 0.0–0.5)
Eosinophils Relative: 7 %
HCT: 38.9 % (ref 36.0–46.0)
Hemoglobin: 12.7 g/dL (ref 12.0–15.0)
Immature Granulocytes: 0 %
Lymphocytes Relative: 44 %
Lymphs Abs: 2.4 10*3/uL (ref 0.7–4.0)
MCH: 30.3 pg (ref 26.0–34.0)
MCHC: 32.6 g/dL (ref 30.0–36.0)
MCV: 92.8 fL (ref 80.0–100.0)
Monocytes Absolute: 0.5 10*3/uL (ref 0.1–1.0)
Monocytes Relative: 8 %
Neutro Abs: 2.2 10*3/uL (ref 1.7–7.7)
Neutrophils Relative %: 40 %
Platelet Count: 235 10*3/uL (ref 150–400)
RBC: 4.19 MIL/uL (ref 3.87–5.11)
RDW: 14.6 % (ref 11.5–15.5)
WBC Count: 5.5 10*3/uL (ref 4.0–10.5)
nRBC: 0 % (ref 0.0–0.2)

## 2021-02-11 LAB — CMP (CANCER CENTER ONLY)
ALT: 6 U/L (ref 0–44)
AST: 12 U/L — ABNORMAL LOW (ref 15–41)
Albumin: 4 g/dL (ref 3.5–5.0)
Alkaline Phosphatase: 129 U/L — ABNORMAL HIGH (ref 38–126)
Anion gap: 7 (ref 5–15)
BUN: 16 mg/dL (ref 6–20)
CO2: 31 mmol/L (ref 22–32)
Calcium: 9.3 mg/dL (ref 8.9–10.3)
Chloride: 101 mmol/L (ref 98–111)
Creatinine: 0.71 mg/dL (ref 0.44–1.00)
GFR, Estimated: >60 mL/min (ref >60)
Glucose, Bld: 128 mg/dL — ABNORMAL HIGH (ref 70–99)
Potassium: 3.1 mmol/L — ABNORMAL LOW (ref 3.5–5.1)
Sodium: 139 mmol/L (ref 135–145)
Total Bilirubin: 0.2 mg/dL — ABNORMAL LOW (ref 0.3–1.2)
Total Protein: 7 g/dL (ref 6.5–8.1)

## 2021-02-11 LAB — MAGNESIUM: Magnesium: 1.8 mg/dL (ref 1.7–2.4)

## 2021-02-11 LAB — CEA (ACCESS): CEA (CHCC): 1.79 ng/mL (ref 0.00–5.00)

## 2021-02-11 MED ORDER — ATROPINE SULFATE 1 MG/ML IV SOLN
0.5000 mg | Freq: Once | INTRAVENOUS | Status: AC
  Administered 2021-02-11: 0.5 mg via INTRAVENOUS
  Filled 2021-02-11: qty 1

## 2021-02-11 MED ORDER — SODIUM CHLORIDE 0.9 % IV SOLN: Freq: Once | INTRAVENOUS | Status: DC

## 2021-02-11 MED ORDER — SODIUM CHLORIDE 0.9 % IV SOLN: Freq: Once | INTRAVENOUS | Status: AC

## 2021-02-11 MED ORDER — HEPARIN SOD (PORK) LOCK FLUSH 100 UNIT/ML IV SOLN: 500.0000 [IU] | Freq: Once | INTRAVENOUS | Status: DC | PRN

## 2021-02-11 MED ORDER — SODIUM CHLORIDE 0.9 % IV SOLN
1800.0000 mg/m2 | INTRAVENOUS | Status: DC
  Administered 2021-02-11: 3350 mg via INTRAVENOUS
  Filled 2021-02-11: qty 67

## 2021-02-11 MED ORDER — SODIUM CHLORIDE 0.9 % IV SOLN
150.0000 mg | Freq: Once | INTRAVENOUS | Status: AC
  Administered 2021-02-11: 150 mg via INTRAVENOUS
  Filled 2021-02-11: qty 5

## 2021-02-11 MED ORDER — FLUOROURACIL CHEMO INJECTION 500 MG/10ML
200.0000 mg/m2 | Freq: Once | INTRAVENOUS | Status: AC
  Administered 2021-02-11: 350 mg via INTRAVENOUS
  Filled 2021-02-11: qty 7

## 2021-02-11 MED ORDER — SODIUM CHLORIDE 0.9 % IV SOLN
125.0000 mg/m2 | Freq: Once | INTRAVENOUS | Status: AC
  Administered 2021-02-11: 240 mg via INTRAVENOUS
  Filled 2021-02-11: qty 10

## 2021-02-11 MED ORDER — SODIUM CHLORIDE 0.9 % IV SOLN
10.0000 mg | Freq: Once | INTRAVENOUS | Status: AC
  Administered 2021-02-11: 10 mg via INTRAVENOUS
  Filled 2021-02-11: qty 1

## 2021-02-11 MED ORDER — SODIUM CHLORIDE 0.9% FLUSH
10.0000 mL | INTRAVENOUS | Status: DC | PRN
  Administered 2021-02-11: 10 mL

## 2021-02-11 MED ORDER — PALONOSETRON HCL INJECTION 0.25 MG/5ML
0.2500 mg | Freq: Once | INTRAVENOUS | Status: AC
  Administered 2021-02-11: 0.25 mg via INTRAVENOUS
  Filled 2021-02-11: qty 5

## 2021-02-11 MED ORDER — SODIUM CHLORIDE 0.9 % IV SOLN
200.0000 mg/m2 | Freq: Once | INTRAVENOUS | Status: AC
  Administered 2021-02-11: 372 mg via INTRAVENOUS
  Filled 2021-02-11: qty 18.6

## 2021-02-11 MED ORDER — SODIUM CHLORIDE 0.9 % IV SOLN
6.0000 mg/kg | Freq: Once | INTRAVENOUS | Status: AC
  Administered 2021-02-11: 440 mg via INTRAVENOUS
  Filled 2021-02-11: qty 5

## 2021-02-11 NOTE — Progress Notes (Signed)
[facility] OFFICE PROGRESS NOTE   Diagnosis: Colon cancer  INTERVAL HISTORY:   April [name] completed another cycle of FOLFIRI/panitumumab on 01/21/2021.  No nausea/vomiting or diarrhea.  Stable rash over the face.  She reports mouth soreness.  Objective:  Vital signs in last 24 hours:  Blood pressure (!) 150/90, pulse 93, temperature 97.8 F (36.6 C), temperature source Oral, resp. rate 18, height '5\' 7"'  (1.702 m), weight 173 lb 9.6 oz (78.7 kg), last menstrual period 01/07/2006, SpO2 99 %.    HEENT: No thrush or ulcers Resp: Lungs clear bilaterally Cardio: Regular rate and rhythm GI: Nontender, no hepatosplenomegaly Vascular: No leg edema  Skin: Mild acne type rash over the face, mild hyperpigmentation at the hands  Portacath/PICC-without erythema  Lab Results:  Lab Results  Component Value Date   WBC 5.5 02/11/2021   HGB 12.7 02/11/2021   HCT 38.9 02/11/2021   MCV 92.8 02/11/2021   PLT 235 02/11/2021   NEUTROABS 2.2 02/11/2021    CMP  Lab Results  Component Value Date   NA 141 01/16/2021   K 3.4 (L) 01/16/2021   CL 105 01/16/2021   CO2 28 01/16/2021   GLUCOSE 84 01/16/2021   BUN <5 (L) 01/16/2021   CREATININE 0.60 01/16/2021   CALCIUM 9.2 01/16/2021   PROT 7.0 01/16/2021   ALBUMIN 4.0 01/16/2021   AST 18 01/16/2021   ALT 11 01/16/2021   ALKPHOS 134 (H) 01/16/2021   BILITOT 0.2 (L) 01/16/2021   GFRNONAA >60 01/16/2021   GFRAA >60 10/17/2019    Lab Results  Component Value Date   CEA1 8.66 (H) 07/02/2020   CEA 1.99 12/24/2020     Medications: I have reviewed the patient's current medications.   Assessment/Plan: Sigmoid colon cancer, stage IV (pT4a,pN2b,M1c) Colonoscopy 03/24/2019-3 rectal polyps-hyperplastic polyps, distal colon biopsy-at least intramucosal adenocarcinoma, completely obstructing mass in the distal sigmoid colon, could not be traversed, intact mismatch repair protein expression 03/24/2019-CEA 56.1 03/29/2019 CT  abdomen/pelvis-circumferential thickening involving the entire mid and distal sigmoid colon to the level of the rectosigmoid junction, solid/cystic mass in the left ovary, bilateral nephrolithiasis Robotic assisted low anterior resection, mesenteric lymphadenectomy, bilateral salpingo-oophorectomy 04/08/2019 Pathology ([facility] review of outside pathology) metastatic adenocarcinoma involving the peritoneum overlying the round ligament, serosa of the urinary bladder, serosa of the right ureter a sacral area, pelvic peritoneum, and left ovary.  Omental biopsy with focal mucin pools with no carcinoma cells identified, right hemidiaphragm biopsy involved by metastatic adenocarcinoma, invasive adenocarcinoma the sigmoid colon, moderately differentiated, T4a, perineural and vascular invasion present, 7/22 lymph nodes, multiple tumor deposits, resection margins negative Negative for PD-L1, low probability of MSI-high, HER-2 negative, negative for BRAF, NRAS and KRAS alterations CTs 05/19/2019-no evidence of metastatic disease, findings suspicious for colitis of the transverse and ascending colon, small amount of ascites in the cul-de-sac, bilateral renal calculi Cycle 1 FOLFIRI 05/24/2019, bevacizumab added with cycle 2 Cycle 4 FOLFIRI/bevacizumab 07/05/2019 Cycle 5 FOLFIRI 07/27/2019, bevacizumab held secondary to hypertension Cycle 6 FOLFIRI 08/10/1999, bevacizumab held secondary to hypertension CTs 08/30/2019-no evidence of recurrent disease, new subsolid right lower lobe nodule felt to be inflammatory, emphysema Cycle 7 FOLFIRI 09/26/2019, bevacizumab remains on hold secondary to hypertension Cycle 8 FOLFIRI 10/17/2019, bevacizumab held secondary to hypertension, Udenyca added for neutropenia Cycle 9 FOLFIRI 11/07/2019, bevacizumab held secondary to hypertension, Udenyca Cycle 10 FOLFIRI 11/28/2019, bevacizumab held, Udenyca Cycle 11 FOLFIRI 12/26/2019, bevacizumab held, Udenyca CTs 01/25/2020-no evidence of recurrent  disease, resolution of right lower lobe nodule Maintenance  Xeloda beginning 02/06/2020 CTs 04/25/2020- no evidence of metastatic disease, multiple bilateral renal calculi without hydronephrosis Maintenance Xeloda continued CT abdomen/pelvis without contrast 08/10/2020-bilateral staghorn renal calculi, no evidence of metastatic disease; addendum 08/23/2020-small but increasing omental nodules. Cycle 1 FOLFIRI/panitumumab 09/19/2020 Cycle 2 FOLFIRI/Panitumumab 10/03/2020 Cycle 3 FOLFIRI/Panitumumab 10/17/2020 Cycle 4 FOLFIRI/panitumumab 10/31/2020 Cycle 5 FOLFIRI/Panitumumab 11/14/2020 CTs 11/26/2020-stable omental metastases compared to 10/22/2020, mildly decreased from 08/10/2020.  Left lower quadrant tiny paracolic gutter implant slightly decreased from CT 08/10/2020.  No new or progressive metastatic disease in the abdomen or pelvis. Cycle 6 FOLFIRI/Panitumumab 11/28/2020, irinotecan dose reduced, treatment schedule adjusted to every 3 weeks going forward Cycle 7 FOLFIRI/panitumumab 12/24/2020 Cycle 8 FOLFIRI/Panitumumab 01/16/2021--treatment held due to hypertension in the infusion area. Cycle 8 FOLFIRI/panitumumab 01/21/2021 Cycle 9 FOLFIRI/panitumumab 02/11/2021   Hypertension G4 P3, twins Kidney stones-bilateral staghorn renal calculi on CT 08/10/2020 Infected Port-A-Cath 09/12/2019-placed on Augmentin, referred for Port-A-Cath removal; Port-A-Cath removed 09/14/2019; PICC line placed 09/14/2019; culture staph aureus.  Course of Septra completed. Neutropenia secondary to chemotherapy-Udenyca added with cycle 8 FOLFIRI Right nephrostomy tube 09/12/2020; stent placed 10/09/2020, percutaneous nephrostolithotomy treatment of right-sided kidney stones 10/09/2020, malpositioned right ureter stent replacement 10/23/2020, right ureter stent removed 10/29/2020       Disposition: April [name] appears stable.  She is tolerating the FOLFIRI/panitumumab well.  She will complete another cycle today.  We will call in  Magic mouthwash for the mouth soreness.  She will return for an office visit and chemotherapy in 3 weeks.  Betsy Coder, MD  02/11/2021  8:15 AM

## 2021-02-11 NOTE — Progress Notes (Signed)
Patient seen by Dr. Benay Spice today  Vitals are within treatment parameters.  Labs reviewed by Dr. Benay Spice and are not all within treatment parameters. Potassium  3.1 Pt to increase oral intake to 20 meq BID  Per physician team, patient is ready for treatment and there are NO modifications to the treatment plan.

## 2021-02-11 NOTE — Progress Notes (Signed)
Patient presents for treatment. RN assessment completed along with the following:   Labs/vitals reviewed - Yes, and Per Dr. Benay Spice, ok to treat with labs from ( date) and BP of ( )  Weight within 10% of previous measurement - Yes Oncology Treatment Attestation completed for current therapy- Yes, on date 09/07/20 Informed consent completed and reflects current therapy/intent - Yes, on date 08/19/20             Provider progress note reviewed - Patient not seen by provider today. Most recent note dated 01/16/21 reviewed. Treatment/Antibody/Supportive plan reviewed - Yes, and there are no adjustments needed for today's treatment. S&H and other orders reviewed - Yes, and there are no additional orders identified. Previous treatment date reviewed - Yes, and the appropriate amount of time has elapsed between treatments.

## 2021-02-11 NOTE — Patient Instructions (Signed)
[facility]  Discharge Instructions: Thank you for choosing [facility] to provide your oncology and hematology care.   If you have a lab appointment with the [facility], please go directly to the [facility] and check in at the registration area.   Wear comfortable clothing and clothing appropriate for easy access to any Portacath or PICC line.   We strive to give you quality time with your provider. You may need to reschedule your appointment if you arrive late (15 or more minutes).  Arriving late affects you and other patients whose appointments are after yours.  Also, if you miss three or more appointments without notifying the office, you may be dismissed from the clinic at the providers discretion.      For prescription refill requests, have your pharmacy contact our office and allow 72 hours for refills to be completed.    Today you received the following chemotherapy and/or immunotherapy agents Vectibix, Fofiri      To help prevent nausea and vomiting after your treatment, we encourage you to take your nausea medication as directed.  BELOW ARE SYMPTOMS THAT SHOULD BE REPORTED IMMEDIATELY: *FEVER GREATER THAN 100.4 F (38 C) OR HIGHER *CHILLS OR SWEATING *NAUSEA AND VOMITING THAT IS NOT CONTROLLED WITH YOUR NAUSEA MEDICATION *UNUSUAL SHORTNESS OF BREATH *UNUSUAL BRUISING OR BLEEDING *URINARY PROBLEMS (pain or burning when urinating, or frequent urination) *BOWEL PROBLEMS (unusual diarrhea, constipation, pain near the anus) TENDERNESS IN MOUTH AND THROAT WITH OR WITHOUT PRESENCE OF ULCERS (sore throat, sores in mouth, or a toothache) UNUSUAL RASH, SWELLING OR PAIN  UNUSUAL VAGINAL DISCHARGE OR ITCHING   Items with * indicate a potential emergency and should be followed up as soon as possible or go to the Emergency Department if any problems should occur.  Please show the CHEMOTHERAPY ALERT CARD or IMMUNOTHERAPY ALERT CARD at check-in  to the Emergency Department and triage nurse.  Should you have questions after your visit or need to cancel or reschedule your appointment, please contact [facility]  Dept: [phone]  and follow the prompts.  Office hours are 8:00 a.m. to 4:30 p.m. Monday - Friday. Please note that voicemails left after 4:00 p.m. may not be returned until the following business day.  We are closed weekends and major holidays. You have access to a nurse at all times for urgent questions. Please call the main number to the clinic Dept: [phone] and follow the prompts.   For any non-urgent questions, you may also contact your provider using MyChart. We now offer e-Visits for anyone 3 and older to request care online for non-urgent symptoms. For details visit [email or web address].   Also download the MyChart app! Go to the app store, search "MyChart", open the app, select , and log in with your MyChart username and password.  Due to Covid, a mask is required upon entering the hospital/clinic. If you do not have a mask, one will be given to you upon arrival. For doctor visits, patients may have 1 support person aged 1 or older with them. For treatment visits, patients cannot have anyone with them due to current Covid guidelines and our immunocompromised population.

## 2021-02-12 ENCOUNTER — Other Ambulatory Visit (HOSPITAL_BASED_OUTPATIENT_CLINIC_OR_DEPARTMENT_OTHER): Payer: Self-pay

## 2021-02-12 ENCOUNTER — Encounter (HOSPITAL_BASED_OUTPATIENT_CLINIC_OR_DEPARTMENT_OTHER): Payer: Self-pay | Admitting: Nurse Practitioner

## 2021-02-12 ENCOUNTER — Other Ambulatory Visit: Payer: Self-pay

## 2021-02-12 ENCOUNTER — Ambulatory Visit (HOSPITAL_BASED_OUTPATIENT_CLINIC_OR_DEPARTMENT_OTHER): Payer: Medicaid Other | Admitting: Nurse Practitioner

## 2021-02-12 VITALS — BP 117/72 | HR 88 | Ht 65.0 in | Wt 163.0 lb

## 2021-02-12 DIAGNOSIS — C19 Malignant neoplasm of rectosigmoid junction: Secondary | ICD-10-CM

## 2021-02-12 DIAGNOSIS — R739 Hyperglycemia, unspecified: Secondary | ICD-10-CM | POA: Diagnosis not present

## 2021-02-12 DIAGNOSIS — C786 Secondary malignant neoplasm of retroperitoneum and peritoneum: Secondary | ICD-10-CM

## 2021-02-12 DIAGNOSIS — R000 Tachycardia, unspecified: Secondary | ICD-10-CM

## 2021-02-12 DIAGNOSIS — I10 Essential (primary) hypertension: Secondary | ICD-10-CM | POA: Diagnosis not present

## 2021-02-12 DIAGNOSIS — Z0000 Encounter for general adult medical examination without abnormal findings: Secondary | ICD-10-CM | POA: Diagnosis not present

## 2021-02-12 DIAGNOSIS — Z720 Tobacco use: Secondary | ICD-10-CM

## 2021-02-12 DIAGNOSIS — E876 Hypokalemia: Secondary | ICD-10-CM

## 2021-02-12 DIAGNOSIS — C20 Malignant neoplasm of rectum: Secondary | ICD-10-CM

## 2021-02-12 MED ORDER — MAGIC MOUTHWASH: ORAL | 0 refills | Status: DC

## 2021-02-12 MED ORDER — METOPROLOL TARTRATE 25 MG PO TABS: 25.0000 mg | ORAL_TABLET | Freq: Two times a day (BID) | ORAL | 3 refills | Status: DC

## 2021-02-12 MED ORDER — HYDRALAZINE HCL 10 MG PO TABS: 10.0000 mg | ORAL_TABLET | Freq: Three times a day (TID) | ORAL | 3 refills | Status: DC

## 2021-02-12 MED ORDER — AMLODIPINE BESYLATE 10 MG PO TABS: 10.0000 mg | ORAL_TABLET | Freq: Every day | ORAL | 3 refills | Status: DC

## 2021-02-12 MED ORDER — NYSTATIN 100000 UNIT/ML MT SUSP
OROMUCOSAL | 1 refills | Status: DC
  Filled 2021-02-12: qty 240, 8d supply, fill #0

## 2021-02-12 MED ORDER — HYDROCHLOROTHIAZIDE 25 MG PO TABS: 25.0000 mg | ORAL_TABLET | Freq: Every day | ORAL | 3 refills | Status: DC

## 2021-02-12 NOTE — Assessment & Plan Note (Signed)
Blood pressures well controlled today at 1617/72. She reports blood pressures are well controlled at home however we have noted that there are elevations in her blood pressure readings when she is at the doctor's office to get chemotherapy. Discussed with patient that this could likely be related to whitecoat hypertension present due to anxiety. Recommend continued monitoring of blood pressure at home at least a couple times a week and notify the office if home readings show more than 150/90. She is not having any alarm symptoms present today. Labs reviewed today.  She is currently on potassium replacement. We will plan to follow-up in approximately 3 months or sooner if needed.

## 2021-02-12 NOTE — Assessment & Plan Note (Signed)
Current smoker.  Has cut down to approximately 1 pack/week. She is working to completely quit smoking however this has been a difficult task for her. Encourage patient to continue with efforts to quit smoking in the setting of metastatic cancer and increased risks related.

## 2021-02-12 NOTE — Assessment & Plan Note (Signed)
Hypokalemia noted on most recent labs by Dr. Rondel Oh with oncology.  No alarm symptoms present today. She has been instructed to increase her potassium to 20 mEq twice a day. We discussed ways to help take this medication and ensure full dosage is obtained. Dr. Rondel Oh is plan to recheck labs in 3 weeks.  We will follow and monitor with this.

## 2021-02-12 NOTE — Assessment & Plan Note (Signed)
History of hyperglycemia on previous labs. She does receive regular labs with Dr. Rondel Oh for oncology treatment. No alarm symptoms present today. Will continue to monitor and plan for A1c once she has completed her chemotherapy treatment or if elevated counts with fasting labs are found on examination.

## 2021-02-12 NOTE — Assessment & Plan Note (Signed)
History of sinus tachycardia currently well controlled on metoprolol daily. She is not having any current symptoms and heart rate and rhythm regular on evaluation today. Will continue metoprolol at this time. Discussed with patient to monitor for new symptoms and report immediately if they present.

## 2021-02-12 NOTE — Progress Notes (Signed)
Orma Render, DNP, AGNP-c [facility] [address]   [place], [place] [address] [phone]  New patient visit   Patient: April [name]   DOB: Oct 03, 1975   46 y.o. Female  MRN: [id number] Visit Date: 02/12/2021  Patient Care Team: Kodah Maret, Coralee Pesa, NP as PCP - General (Nurse Practitioner) Ladell Pier, MD as Consulting Physician (Oncology)  Today's healthcare provider: Orma Render, NP   Chief Complaint  Patient presents with   New Patient (Initial Visit)    Patient presents today to establish care. Her biggest concern is that she keeps her BP down. She currently has colon cancer stage 4 and see's Dr Benay Spice.    Subjective    HPI HPI     New Patient (Initial Visit)    Additional comments: Patient presents today to establish care. Her biggest concern is that she keeps her BP down. She currently has colon cancer stage 4 and see's Dr Benay Spice.       Last edited by Pennelope Bracken, CMA on 02/12/2021  2:00 PM.      April [name] is a 46 y.o. female who presents today as a new patient to establish care.    She is currently under treatment for colon cancer and is followed by Dr. Benay Spice with oncology. Current receiving chemotherapy continuous infusion.  She is on her third round of a planned 6 rounds of treatment at this time. Recent labs showed hypokalemia and Dr. Benay Spice increased PO K to 18mEq BID. She is due for follow-up and labs in 3 weeks. She has a port-a-cath in place.  She reports she was initially diagnosed in [place] 2021 with surgery and subsequent chemotherapy.  She did experience remission for approximately 7 to 8 months however was then found to have additional cancer sites present and restarted chemotherapy recently.  She tells me she is tolerating the treatments well after alterations to the dosages.  She denies any significant nausea or vomiting at this time.  She has had some thinning of her hair but  not complete loss at this time.  She does endorse fatigue.  He is having labs drawn with Dr. Loma Newton office.  She also has a history of HTN. Taking HCTZ, hydralazine, amlodipine, and metoprolol. Review of recent BP from cancer center show BP not always well controlled.  She endorses blood pressures within the normal limits when at home however they do spike whenever she goes into the office.  She is not having any symptoms of shortness of breath, chest pain, dizziness, headache.  She does endorse vision changes after starting recent chemotherapy treatment however she is unclear if this is related to the chemotherapy or something else.   Past Medical History:  Diagnosis Date   Benign essential hypertension 10/06/2017   Last Assessment & Plan:  Formatting of this note might be different from the original. Patient's blood pressure noted to be elevated at today's visit 188/98.  Patient will be provided with antihypertensives in the treatment center if needed in order to meet her Avastin parameters.  Patient recommended to follow up with her primary care physician regarding medication management.  Patient is not cur   Cancer ([place])    Colon cancer ([place])    Hypertension    Renal calculi    bilateral   Past Surgical History:  Procedure Laterality Date   CESAREAN SECTION     x2   CYSTOSCOPY WITH RETROGRADE PYELOGRAM, URETEROSCOPY AND STENT PLACEMENT  Right 10/22/2020   Procedure: CYSTOSCOPY WITH RETROGRADE PYELOGRAM, URETEROSCOPY AND STENT PLACEMENT;  Surgeon: Robley Fries, MD;  Location: [facility];  Service: Urology;  Laterality: Right;   IR RADIOLOGIST EVAL & MGMT  09/16/2019   IR RADIOLOGIST EVAL & MGMT  09/23/2019   IR RADIOLOGIST EVAL & MGMT  09/20/2019   IR RADIOLOGIST EVAL & MGMT  09/30/2019   IR REMOVAL TUN ACCESS W/ PORT W/O FL MOD SED  09/14/2019   URETERAL STENT PLACEMENT     Family Status  Relation Name Status   Mother  (Not Specified)   Father  (Not Specified)   Sister  (Not  Specified)   Family History  Problem Relation Age of Onset   Hypertension Mother    Diabetes Mellitus II Father    Stroke Father    Ulcers Sister    Social History   Socioeconomic History   Marital status: Single    Spouse name: Not on file   Number of children: Not on file   Years of education: Not on file   Highest education level: Not on file  Occupational History   Not on file  Tobacco Use   Smoking status: Every Day    Packs/day: 0.50    Types: Cigarettes   Smokeless tobacco: Never   Tobacco comments:    In the process of quitting/ has been able to cut back  Vaping Use   Vaping Use: Never used  Substance and Sexual Activity   Alcohol use: Yes   Drug use: Not Currently   Sexual activity: Not Currently  Other Topics Concern   Not on file  Social History Narrative   Not on file   Social Determinants of Health   Financial Resource Strain: Not on file  Food Insecurity: Not on file  Transportation Needs: Not on file  Physical Activity: Not on file  Stress: Not on file  Social Connections: Not on file   Outpatient Medications Prior to Visit  Medication Sig   dexamethasone (DECADRON) 4 MG tablet Take 1 tablet (4 mg total) by mouth 2 (two) times daily with a meal. Start on day 2 of treatment and take for 3 days with each cycle.   diphenoxylate-atropine (LOMOTIL) 2.5-0.025 MG tablet Take 1 tablet by mouth 4 (four) times daily as needed for diarrhea or loose stools. (Patient not taking: Reported on 02/11/2021)   doxycycline (VIBRAMYCIN) 100 MG capsule Take 100 mg by mouth 2 (two) times daily.   Oxybutynin Chloride (DITROPAN XL PO) Take by mouth.   potassium chloride SA (KLOR-CON) 20 MEQ tablet TAKE 1 TABLET BY MOUTH EVERY DAY   [DISCONTINUED] amLODipine (NORVASC) 10 MG tablet Take 1 tablet (10 mg total) by mouth daily.   [DISCONTINUED] hydrALAZINE (APRESOLINE) 10 MG tablet TAKE 1 TABLET(10 MG) BY MOUTH THREE TIMES DAILY (Patient taking differently: Take 10 mg by mouth 3  (three) times daily.)   [DISCONTINUED] hydrochlorothiazide (HYDRODIURIL) 25 MG tablet TAKE 1 TABLET BY MOUTH EVERY DAY (Patient taking differently: Take 25 mg by mouth daily.)   [DISCONTINUED] HYDROcodone-acetaminophen (NORCO/VICODIN) 5-325 MG tablet Take 1 tablet by mouth every 6 (six) hours as needed. (Patient not taking: Reported on 02/11/2021)   [DISCONTINUED] hydrocortisone 2.5 % cream Apply topically daily. (Patient not taking: Reported on 02/11/2021)   [DISCONTINUED] lactose free nutrition (BOOST) LIQD Take 237 mLs by mouth daily. (Patient not taking: Reported on 02/11/2021)   [DISCONTINUED] LORazepam (ATIVAN) 0.5 MG tablet Take 0.5 mg by mouth every 6 (six) hours as needed (  for nausea). (Patient not taking: Reported on 02/11/2021)   [DISCONTINUED] metoprolol tartrate (LOPRESSOR) 25 MG tablet Take 1 tablet (25 mg total) by mouth 2 (two) times daily.   [DISCONTINUED] ondansetron (ZOFRAN) 4 MG tablet Take 1 tablet (4 mg total) by mouth every 8 (eight) hours as needed for nausea or vomiting. Do not take for 3 days after chemo (Patient not taking: Reported on 02/11/2021)   [DISCONTINUED] prochlorperazine (COMPAZINE) 10 MG tablet Take 1 tablet (10 mg total) by mouth every 6 (six) hours as needed for nausea or vomiting. (Patient not taking: Reported on 02/11/2021)   No facility-administered medications prior to visit.   Allergies  Allergen Reactions   Lisinopril Swelling    Lip swelling   Irinotecan Other (See Comments)    Stomach cramping Patient given 0.5 mL atropine IV and was able to continue infusion.  See Progress note on 10/03/20.    Leucovorin Calcium Other (See Comments)    Stomach cramping Patient given 0.5 mL atropine IV and was able to continue infusion. See Progress note on 10/03/20.     Immunization History  Administered Date(s) Administered   PFIZER(Purple Top)SARS-COV-2 Vaccination 08/31/2019, 09/23/2019   PPD Test 05/24/2018    Health Maintenance  Topic Date Due   Hepatitis  C Screening  Never done   TETANUS/TDAP  Never done   PAP SMEAR-Modifier  Never done   COLONOSCOPY (Pts 45-11yrs Insurance coverage will need to be confirmed)  Never done   COVID-19 Vaccine (3 - Pfizer risk series) 10/21/2019   INFLUENZA VACCINE  10/14/2021 (Originally 08/13/2020)   HIV Screening  Completed   HPV VACCINES  Aged Out    Patient Care Team: Toussaint Golson, Coralee Pesa, NP as PCP - General (Nurse Practitioner) Ladell Pier, MD as Consulting Physician (Oncology)  Review of Systems All review of systems negative except what is listed in the HPI   Objective    BP 117/72    Pulse 88    Ht 5\' 5"  (1.651 m)    Wt 163 lb (73.9 kg)    LMP 01/07/2006    SpO2 95%    BMI 27.12 kg/m  Physical Exam Vitals and nursing note reviewed.  Constitutional:      General: She is not in acute distress.    Appearance: Normal appearance.  HENT:     Head: Normocephalic.  Eyes:     Extraocular Movements: Extraocular movements intact.     Conjunctiva/sclera: Conjunctivae normal.     Pupils: Pupils are equal, round, and reactive to light.     Comments: No signs of lateral or horizontal nystagmus present  Neck:     Vascular: No carotid bruit.  Cardiovascular:     Rate and Rhythm: Normal rate and regular rhythm.     Pulses: Normal pulses.     Heart sounds: Normal heart sounds. No murmur heard. Pulmonary:     Effort: Pulmonary effort is normal.     Breath sounds: Normal breath sounds. No wheezing.  Abdominal:     General: Bowel sounds are normal. There is no distension.     Palpations: Abdomen is soft. There is no mass.     Tenderness: There is no abdominal tenderness. There is no right CVA tenderness, left CVA tenderness or guarding.  Musculoskeletal:        General: Normal range of motion.     Cervical back: Normal range of motion.     Right lower leg: No edema.     Left lower leg: No  edema.  Skin:    General: Skin is warm and dry.     Capillary Refill: Capillary refill takes less than 2 seconds.   Neurological:     General: No focal deficit present.     Mental Status: She is alert and oriented to person, place, and time.     Motor: Weakness present.  Psychiatric:        Mood and Affect: Mood normal.        Behavior: Behavior normal.        Thought Content: Thought content normal.        Judgment: Judgment normal.    Depression Screen PHQ 2/9 Scores 02/12/2021 02/12/2021 09/09/2019  PHQ - 2 Score 1 1 0  PHQ- 9 Score 5 - -   No results found for any visits on 02/12/21.  Assessment & Plan      Problem List Items Addressed This Visit     Hypertension    Blood pressures well controlled today at 1617/72. She reports blood pressures are well controlled at home however we have noted that there are elevations in her blood pressure readings when she is at the doctor's office to get chemotherapy. Discussed with patient that this could likely be related to whitecoat hypertension present due to anxiety. Recommend continued monitoring of blood pressure at home at least a couple times a week and notify the office if home readings show more than 150/90. She is not having any alarm symptoms present today. Labs reviewed today.  She is currently on potassium replacement. We will plan to follow-up in approximately 3 months or sooner if needed.      Relevant Medications   amLODipine (NORVASC) 10 MG tablet   hydrALAZINE (APRESOLINE) 10 MG tablet   hydrochlorothiazide (HYDRODIURIL) 25 MG tablet   metoprolol tartrate (LOPRESSOR) 25 MG tablet   Sinus tachycardia    History of sinus tachycardia currently well controlled on metoprolol daily. She is not having any current symptoms and heart rate and rhythm regular on evaluation today. Will continue metoprolol at this time. Discussed with patient to monitor for new symptoms and report immediately if they present.      Hypokalemia    Hypokalemia noted on most recent labs by Dr. Rondel Oh with oncology.  No alarm symptoms present today. She has  been instructed to increase her potassium to 20 mEq twice a day. We discussed ways to help take this medication and ensure full dosage is obtained. Dr. Rondel Oh is plan to recheck labs in 3 weeks.  We will follow and monitor with this.      Hyperglycemia    History of hyperglycemia on previous labs. She does receive regular labs with Dr. Rondel Oh for oncology treatment. No alarm symptoms present today. Will continue to monitor and plan for A1c once she has completed her chemotherapy treatment or if elevated counts with fasting labs are found on examination.      Tobacco use    Current smoker.  Has cut down to approximately 1 pack/week. She is working to completely quit smoking however this has been a difficult task for her. Encourage patient to continue with efforts to quit smoking in the setting of metastatic cancer and increased risks related.       Rectal cancer [facility])    Rectal cancer with initial diagnosis in 2021 and repeat recurrence in 2022. She is currently taking chemotherapy on continuous infusion base with repeat visits every 3 weeks. She reports she is tolerating this well.  Appears  stable today with no concerning findings on exam. Reviewed labs from oncology today and discussed potassium replacement with patient. We will plan to continue to follow along and provide supportive care as needed with oncology.       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      Taeko Schaffer, Coralee Pesa, NP, DNP, AGNP-C [facility]

## 2021-02-12 NOTE — Assessment & Plan Note (Signed)
Rectal cancer with initial diagnosis in 2021 and repeat recurrence in 2022. She is currently taking chemotherapy on continuous infusion base with repeat visits every 3 weeks. She reports she is tolerating this well.  Appears stable today with no concerning findings on exam. Reviewed labs from oncology today and discussed potassium replacement with patient. We will plan to continue to follow along and provide supportive care as needed with oncology.

## 2021-02-12 NOTE — Patient Instructions (Signed)
Thank you for choosing [place] at [facility] for your Primary Care needs. I am excited for the opportunity to partner with you to meet your health care goals. It was a pleasure meeting you today!  Recommendations from today's visit: I sent in the refills for your BP medication. Keep a close eye on this and let me know if it is running high at home.   Information on diet, exercise, and health maintenance recommendations are listed below. This is information to help you be sure you are on track for optimal health and monitoring.   Please look over this and let us know if you have any questions or if you have completed any of the health maintenance outside of [place] so that we can be sure your records are up to date.  ___________________________________________________________ About Me: I am an Adult-Geriatric Nurse Practitioner with a background in caring for patients for more than 20 years with a strong intensive care background. I provide primary care and sports medicine services to patients age 72 and older within this office. My education had a strong focus on caring for the older adult population, which I am passionate about. I am also the director of the APP Fellowship with [facility].   My desire is to provide you with the best service through preventive medicine and supportive care. I consider you a part of the medical team and value your input. I work diligently to ensure that you are heard and your needs are met in a safe and effective manner. I want you to feel comfortable with me as your provider and want you to know that your health concerns are important to me.  For your information, our office hours are: Monday, Tuesday, and Thursday 8:00 AM - 5:00 PM Wednesday and Friday 8:00 AM - 12:00 PM.   In my time away from the office I am teaching new APP's within the system and am unavailable, but my partner, Dr. Burnard Bunting is in the office for emergent needs.   If  you have questions or concerns, please call our office at [phone] or send [place] a MyChart message and we will respond as quickly as possible.  ____________________________________________________________ MyChart:  For all urgent or time sensitive needs we ask that you please call the office to avoid delays. Our number is [phone]. MyChart is not constantly monitored and due to the large volume of messages a day, replies may take up to 72 business hours.  MyChart Policy: MyChart allows for you to see your visit notes, after visit summary, provider recommendations, lab and tests results, make an appointment, request refills, and contact your provider or the office for non-urgent questions or concerns. Providers are seeing patients during normal business hours and do not have built in time to review MyChart messages.  We ask that you allow a minimum of 3 business days for responses to [employer]. For this reason, please do not send urgent requests through [place]. Please call the office at [phone]. New and ongoing conditions may require a visit. We have virtual and in person visit available for your convenience.  Complex MyChart concerns may require a visit. Your provider may request you schedule a virtual or in person visit to ensure we are providing the best care possible. MyChart messages sent after 11:00 AM on Friday will not be received by the provider until Monday morning.    Lab and Test Results: You will receive your lab and test results on MyChart  as soon as they are completed and results have been sent by the lab or testing facility. Due to this service, you will receive your results BEFORE your provider.  I review lab and tests results each morning prior to seeing patients. Some results require collaboration with other providers to ensure you are receiving the most appropriate care. For this reason, we ask that you please allow a minimum of 3-5 business days from the time  the ALL results have been received for your provider to receive and review lab and test results and contact you about these.  Most lab and test result comments from the provider will be sent through [place]. Your provider may recommend changes to the plan of care, follow-up visits, repeat testing, ask questions, or request an office visit to discuss these results. You may reply directly to this message or call the office at [phone] to provide information for the provider or set up an appointment. In some instances, you will be called with test results and recommendations. Please let us know if this is preferred and we will make note of this in your chart to provide this for you.    If you have not heard a response to your lab or test results in 5 business days from all results returning to [place], please call the office to let us know. We ask that you please avoid calling prior to this time unless there is an emergent concern. Due to high call volumes, this can delay the resulting process.  After Hours: For all non-emergency after hours needs, please call the office at [phone] and select the option to reach the on-call provider service. On-call services are shared between multiple [place] offices and therefore it will not be possible to speak directly with your provider. On-call providers may provide medical advice and recommendations, but are unable to provide refills for maintenance medications.  For all emergency or urgent medical needs after normal business hours, we recommend that you seek care at the closest Urgent Care or Emergency Department to ensure appropriate treatment in a timely manner.  [facility] has a 24 hour emergency room located on the ground floor for your convenience.   Urgent Concerns During the Business Day Providers are seeing patients from 8AM to [place] with a busy schedule and are most often not able to respond to non-urgent calls until the  end of the day or the next business day. If you should have URGENT concerns during the day, please call and speak to the nurse or schedule a same day appointment so that we can address your concern without delay.   Thank you, again, for choosing me as your health care partner. I appreciate your trust and look forward to learning more about you.   Worthy Keeler, DNP, AGNP-c ___________________________________________________________  Health Maintenance Recommendations Screening Testing Mammogram Every 1 -2 years based on history and risk factors Starting at age 75 Pap Smear Ages 21-39 every 3 years Ages 72-65 every 5 years with HPV testing More frequent testing may be required based on results and history Colon Cancer Screening Every 1-10 years based on test performed, risk factors, and history Starting at age 68 Bone Density Screening Every 2-10 years based on history Starting at age 75 for women Recommendations for men differ based on medication usage, history, and risk factors AAA Screening One time ultrasound Men 2-43 years old who have every smoked Lung Cancer Screening Low Dose Lung CT every 12 months Age 39-80  years with a 30 pack-year smoking history who still smoke or who have quit within the last 15 years  Screening Labs Routine  Labs: Complete Blood Count (CBC), Complete Metabolic Panel (CMP), Cholesterol (Lipid Panel) Every 6-12 months based on history and medications May be recommended more frequently based on current conditions or previous results Hemoglobin A1c Lab Every 3-12 months based on history and previous results Starting at age 42 or earlier with diagnosis of diabetes, high cholesterol, BMI >26, and/or risk factors Frequent monitoring for patients with diabetes to ensure blood sugar control Thyroid Panel (TSH w/ T3 & T4) Every 6 months based on history, symptoms, and risk factors May be repeated more often if on medication HIV One time testing for  all patients 49 and older May be repeated more frequently for patients with increased risk factors or exposure Hepatitis C One time testing for all patients 46 and older May be repeated more frequently for patients with increased risk factors or exposure Gonorrhea, Chlamydia Every 12 months for all sexually active persons 13-24 years Additional monitoring may be recommended for those who are considered high risk or who have symptoms PSA Men 27-2 years old with risk factors Additional screening may be recommended from age 30-69 based on risk factors, symptoms, and history  Vaccine Recommendations Tetanus Booster All adults every 10 years Flu Vaccine All patients 6 months and older every year COVID Vaccine All patients 12 years and older Initial dosing with booster May recommend additional booster based on age and health history HPV Vaccine 2 doses all patients age 53-26 Dosing may be considered for patients over 26 Shingles Vaccine (Shingrix) 2 doses all adults 73 years and older Pneumonia (Pneumovax 23) All adults 27 years and older May recommend earlier dosing based on health history Pneumonia (Prevnar 42) All adults 78 years and older Dosed 1 year after Pneumovax 23  Additional Screening, Testing, and Vaccinations may be recommended on an individualized basis based on family history, health history, risk factors, and/or exposure.  __________________________________________________________  Diet Recommendations for All Patients  I recommend that all patients maintain a diet low in saturated fats, carbohydrates, and cholesterol. While this can be challenging at first, it is not impossible and small changes can make big differences.  Things to try: Decreasing the amount of soda, sweet tea, and/or juice to one or less per day and replace with water While water is always the first choice, if you do not like water you may consider adding a water additive without sugar to improve  the taste other sugar free drinks Replace potatoes with a brightly colored vegetable at dinner Use healthy oils, such as canola oil or olive oil, instead of butter or hard margarine Limit your bread intake to two pieces or less a day Replace regular pasta with low carb pasta options Bake, broil, or grill foods instead of frying Monitor portion sizes  Eat smaller, more frequent meals throughout the day instead of large meals  An important thing to remember is, if you love foods that are not great for your health, you don't have to give them up completely. Instead, allow these foods to be a reward when you have done well. Allowing yourself to still have special treats every once in a while is a nice way to tell yourself thank you for working hard to keep yourself healthy.   Also remember that every day is a new day. If you have a bad day and "fall off the wagon", you can still  climb right back up and keep moving along on your journey!  We have resources available to help you!  Some websites that may be helpful include: www.http://carter.biz/  Www.VeryWellFit.com _____________________________________________________________  Activity Recommendations for All Patients  I recommend that all adults get at least 20 minutes of moderate physical activity that elevates your heart rate at least 5 days out of the week.  Some examples include: Walking or jogging at a pace that allows you to carry on a conversation Cycling (stationary bike or outdoors) Water aerobics Yoga Weight lifting Dancing If physical limitations prevent you from putting stress on your joints, exercise in a pool or seated in a chair are excellent options.  Do determine your MAXIMUM heart rate for activity: YOUR AGE - 220 = MAX HeartRate   Remember! Do not push yourself too hard.  Start slowly and build up your pace, speed, weight, time in exercise, etc.  Allow your body to rest between exercise and get good sleep. You will need  more water than normal when you are exerting yourself. Do not wait until you are thirsty to drink. Drink with a purpose of getting in at least 8, 8 ounce glasses of water a day plus more depending on how much you exercise and sweat.    If you begin to develop dizziness, chest pain, abdominal pain, jaw pain, shortness of breath, headache, vision changes, lightheadedness, or other concerning symptoms, stop the activity and allow your body to rest. If your symptoms are severe, seek emergency evaluation immediately. If your symptoms are concerning, but not severe, please let us know so that we can recommend further evaluation.

## 2021-02-13 ENCOUNTER — Inpatient Hospital Stay: Payer: Medicaid Other | Attending: Oncology

## 2021-02-13 ENCOUNTER — Other Ambulatory Visit: Payer: Self-pay

## 2021-02-13 VITALS — BP 132/92 | HR 73 | Temp 98.1°F | Resp 18

## 2021-02-13 DIAGNOSIS — C187 Malignant neoplasm of sigmoid colon: Secondary | ICD-10-CM | POA: Diagnosis present

## 2021-02-13 DIAGNOSIS — Z5112 Encounter for antineoplastic immunotherapy: Secondary | ICD-10-CM | POA: Diagnosis present

## 2021-02-13 DIAGNOSIS — Z452 Encounter for adjustment and management of vascular access device: Secondary | ICD-10-CM | POA: Diagnosis not present

## 2021-02-13 DIAGNOSIS — C20 Malignant neoplasm of rectum: Secondary | ICD-10-CM

## 2021-02-13 DIAGNOSIS — Z5111 Encounter for antineoplastic chemotherapy: Secondary | ICD-10-CM | POA: Insufficient documentation

## 2021-02-13 DIAGNOSIS — Z79899 Other long term (current) drug therapy: Secondary | ICD-10-CM | POA: Diagnosis not present

## 2021-02-13 MED ORDER — SODIUM CHLORIDE 0.9% FLUSH
10.0000 mL | INTRAVENOUS | Status: DC | PRN
  Administered 2021-02-13: 10 mL

## 2021-02-13 MED ORDER — HEPARIN SOD (PORK) LOCK FLUSH 100 UNIT/ML IV SOLN
250.0000 [IU] | Freq: Once | INTRAVENOUS | Status: AC | PRN
  Administered 2021-02-13: 250 [IU]

## 2021-02-13 NOTE — Patient Instructions (Signed)
PICC Home Care Guide A peripherally inserted central catheter (PICC) is a form of IV access that allows medicines and IV fluids to be quickly put into the blood and spread throughout the body. The PICC is a long, thin, flexible tube (catheter) that is put into a vein in a person's arm or leg. The catheter ends in a large vein just outside the heart called the superior vena cava (SVC). After the PICC is put in, a chest X-ray may be done to make sure that it is in the right place. A PICC may be placed for different reasons, such as: To give medicines and liquid nutrition. To give IV fluids and blood products. To take blood samples often. If there is trouble placing a peripheral intravenous (PIV) catheter. If cared for properly, a PICC can remain in place for many months. Having a PICC can allow you to go home from the hospital sooner and continue treatment at home. Medicines and PICC care can be managed at home by a family member, caregiver, or home health care team. What are the risks? Generally, having a PICC is safe. However, problems may occur, including: A blood clot (thrombus) forming in or at the end of the PICC. A blood clot forming in a vein (deep vein thrombosis) or traveling to the lung (pulmonary embolism). Inflammation of the vein (phlebitis) in which the PICC is placed. Infection at the insertion site or in the blood. Blood infections from central lines, like PICCs, can be serious and often require a hospital stay. PICC malposition, or PICC movement or poor placement. A break or cut in the PICC. Do not use scissors near the PICC. Nerve or tendon irritation or injury during PICC insertion. How to care for your PICC Please follow the specific guidelines provided by your health care provider. Preventing infection You and any caregivers should wash your hands often with soap and water for at least 20 seconds. Wash hands: Before touching the PICC or the infusion device. Before changing a  bandage (dressing). Do not change the dressing unless you have been taught to do so and have shown you are able to change it safely. Flush the PICC as told. Tell your health care provider right away if the PICC is hard to flush or does not flush. Do not use force to flush the PICC. Use clean and germ-free (sterile) supplies only. Keep the supplies in a dry place. Do not reuse needles, syringes, or any other supplies. Reusing supplies can lead to infection. Keep the PICC dressing dry and secure it with tape if the edges stop sticking to your skin. Check your PICC insertion site every day for signs of infection. Check for: Redness, swelling, or pain. Fluid or blood. Warmth. Pus or a bad smell. Preventing other problems Do not use a syringe that is less than 10 mL to flush the PICC. Do not have your blood pressure checked on the arm in which the PICC is placed. Do not ever pull or tug on the PICC. Keep it secured to your arm with tape or a stretch wrap when not in use. Do not take the PICC out yourself. Only a trained health care provider should remove the PICC. Keep pets and children away from your PICC. How to care for your PICC dressing Keep your PICC dressing clean and dry to prevent infection. Do not take baths, swim, or use a hot tub until your health care provider approves. Ask your health care provider if you can take   showers. You may only be allowed to take sponge baths. When you are allowed to shower: Ask your health care provider to teach you how to wrap the PICC. Cover the PICC with clear plastic wrap and tape to keep it dry while showering. Follow instructions from your health care provider about how to take care of your insertion site and dressing. Make sure you: Wash your hands with soap and water for at least 20 seconds before and after you change your dressing. If soap and water are not available, use hand sanitizer. Change your dressing only if taught to do so by your health care  provider. Your PICC dressing needs to be changed if it becomes loose or wet. Leave stitches (sutures), skin glue, or adhesive strips in place. These skin closures may need to stay in place for 2 weeks or longer. If adhesive strip edges start to loosen and curl up, you may trim the loose edges. Do not remove adhesive strips completely unless your health care provider tells you to do that. Follow these instructions at home: Disposal of supplies Throw away any syringes in a disposal container that is meant for sharp items (sharps container). You can buy a sharps container from a pharmacy, or you can make one by using an empty, hard plastic bottle with a lid. Place any used dressings or infusion bags into a plastic bag. Throw that bag in the trash. General instructions  Always carry your PICC identification card or wear a medical alert bracelet. Keep the tube clamped at all times, unless it is being used. Always carry a smooth-edge clamp with you to clamp the PICC if it breaks. Do not use scissors or sharp objects near the tube. You may bend your arm and move it freely. If your PICC is near or at the bend of your elbow, avoid activity with repeated motion at the elbow. Avoid lifting heavy objects as told by your health care provider. Keep all follow-up visits. This is important. You will need to have your PICC dressing changed at least once a week. Contact a health care provider if: You have pain in your arm, ear, face, or teeth. You have a fever or chills. You have redness, swelling, or pain around the insertion site. You have fluid or blood coming from the insertion site. Your insertion site feels warm to the touch. You have pus or a bad smell coming from the insertion site. Your skin feels hard and raised around the insertion site. Your PICC dressing has gotten wet or is coming off and you have not been taught how to change it. Get help right away if: You have problems with your PICC, such as  your PICC: Was tugged or pulled and has partially come out. Do not  push the PICC back in. Cannot be flushed, is hard to flush, or leaks around the insertion site when it is flushed. Makes a flushing sound when it is flushed. Appears to have a hole or tear. Is accidentally pulled all the way out. If this happens, cover the insertion site with a gauze dressing. Do not throw the PICC away. Your health care provider will need to check it to be sure the entire catheter came out. You feel your heart racing or skipping beats, or you have chest pain. You have shortness of breath or trouble breathing. You have swelling, redness, warmth, or pain in the arm in which the PICC is placed. You have a red streak going up your arm that   starts under the PICC dressing. These symptoms may be an emergency. Get help right away. Call 911. Do not wait to see if the symptoms will go away. Do not drive yourself to the hospital. Summary A peripherally inserted central catheter (PICC) is a long, thin, flexible tube (catheter) that is put into a vein in the arm or leg. If cared for properly, a PICC can remain in place for many months. Having a PICC can allow you to go home from the hospital sooner and continue treatment at home. The PICC is inserted using a germ-free (sterile) technique by a specially trained health care provider. Only a trained health care provider should remove it. Do not have your blood pressure checked on the arm in which your PICC is placed. Always keep your PICC identification card with you. This information is not intended to replace advice given to you by your health care provider. Make sure you discuss any questions you have with your health care provider. Document Revised: 07/18/2020 Document Reviewed: 07/18/2020 Elsevier Patient Education  2022 [employer]  

## 2021-02-20 ENCOUNTER — Inpatient Hospital Stay: Payer: Medicaid Other

## 2021-02-20 ENCOUNTER — Other Ambulatory Visit (HOSPITAL_BASED_OUTPATIENT_CLINIC_OR_DEPARTMENT_OTHER): Payer: Self-pay

## 2021-02-20 ENCOUNTER — Other Ambulatory Visit: Payer: Self-pay

## 2021-02-20 VITALS — BP 122/108 | HR 110 | Temp 99.4°F | Resp 18

## 2021-02-20 DIAGNOSIS — Z95828 Presence of other vascular implants and grafts: Secondary | ICD-10-CM

## 2021-02-20 DIAGNOSIS — Z5111 Encounter for antineoplastic chemotherapy: Secondary | ICD-10-CM | POA: Diagnosis not present

## 2021-02-20 DIAGNOSIS — C20 Malignant neoplasm of rectum: Secondary | ICD-10-CM

## 2021-02-20 MED ORDER — SODIUM CHLORIDE 0.9% FLUSH
10.0000 mL | Freq: Once | INTRAVENOUS | Status: AC
  Administered 2021-02-20: 10 mL

## 2021-02-20 MED ORDER — CARESTART COVID-19 HOME TEST VI KIT
PACK | 0 refills | Status: DC
  Filled 2021-02-20: qty 2, 2d supply, fill #0

## 2021-02-20 MED ORDER — HEPARIN SOD (PORK) LOCK FLUSH 100 UNIT/ML IV SOLN
500.0000 [IU] | Freq: Once | INTRAVENOUS | Status: AC
  Administered 2021-02-20: 500 [IU]

## 2021-02-20 NOTE — Patient Instructions (Signed)
PICC Home Care Guide A peripherally inserted central catheter (PICC) is a form of IV access that allows medicines and IV fluids to be quickly put into the blood and spread throughout the body. The PICC is a long, thin, flexible tube (catheter) that is put into a vein in a person's arm or leg. The catheter ends in a large vein just outside the heart called the superior vena cava (SVC). After the PICC is put in, a chest X-ray may be done to make sure that it is in the right place. A PICC may be placed for different reasons, such as: To give medicines and liquid nutrition. To give IV fluids and blood products. To take blood samples often. If there is trouble placing a peripheral intravenous (PIV) catheter. If cared for properly, a PICC can remain in place for many months. Having a PICC can allow you to go home from the hospital sooner and continue treatment at home. Medicines and PICC care can be managed at home by a family member, caregiver, or home health care team. What are the risks? Generally, having a PICC is safe. However, problems may occur, including: A blood clot (thrombus) forming in or at the end of the PICC. A blood clot forming in a vein (deep vein thrombosis) or traveling to the lung (pulmonary embolism). Inflammation of the vein (phlebitis) in which the PICC is placed. Infection at the insertion site or in the blood. Blood infections from central lines, like PICCs, can be serious and often require a hospital stay. PICC malposition, or PICC movement or poor placement. A break or cut in the PICC. Do not use scissors near the PICC. Nerve or tendon irritation or injury during PICC insertion. How to care for your PICC Please follow the specific guidelines provided by your health care provider. Preventing infection You and any caregivers should wash your hands often with soap and water for at least 20 seconds. Wash hands: Before touching the PICC or the infusion device. Before changing a  bandage (dressing). Do not change the dressing unless you have been taught to do so and have shown you are able to change it safely. Flush the PICC as told. Tell your health care provider right away if the PICC is hard to flush or does not flush. Do not use force to flush the PICC. Use clean and germ-free (sterile) supplies only. Keep the supplies in a dry place. Do not reuse needles, syringes, or any other supplies. Reusing supplies can lead to infection. Keep the PICC dressing dry and secure it with tape if the edges stop sticking to your skin. Check your PICC insertion site every day for signs of infection. Check for: Redness, swelling, or pain. Fluid or blood. Warmth. Pus or a bad smell. Preventing other problems Do not use a syringe that is less than 10 mL to flush the PICC. Do not have your blood pressure checked on the arm in which the PICC is placed. Do not ever pull or tug on the PICC. Keep it secured to your arm with tape or a stretch wrap when not in use. Do not take the PICC out yourself. Only a trained health care provider should remove the PICC. Keep pets and children away from your PICC. How to care for your PICC dressing Keep your PICC dressing clean and dry to prevent infection. Do not take baths, swim, or use a hot tub until your health care provider approves. Ask your health care provider if you can take   showers. You may only be allowed to take sponge baths. When you are allowed to shower: Ask your health care provider to teach you how to wrap the PICC. Cover the PICC with clear plastic wrap and tape to keep it dry while showering. Follow instructions from your health care provider about how to take care of your insertion site and dressing. Make sure you: Wash your hands with soap and water for at least 20 seconds before and after you change your dressing. If soap and water are not available, use hand sanitizer. Change your dressing only if taught to do so by your health care  provider. Your PICC dressing needs to be changed if it becomes loose or wet. Leave stitches (sutures), skin glue, or adhesive strips in place. These skin closures may need to stay in place for 2 weeks or longer. If adhesive strip edges start to loosen and curl up, you may trim the loose edges. Do not remove adhesive strips completely unless your health care provider tells you to do that. Follow these instructions at home: Disposal of supplies Throw away any syringes in a disposal container that is meant for sharp items (sharps container). You can buy a sharps container from a pharmacy, or you can make one by using an empty, hard plastic bottle with a lid. Place any used dressings or infusion bags into a plastic bag. Throw that bag in the trash. General instructions  Always carry your PICC identification card or wear a medical alert bracelet. Keep the tube clamped at all times, unless it is being used. Always carry a smooth-edge clamp with you to clamp the PICC if it breaks. Do not use scissors or sharp objects near the tube. You may bend your arm and move it freely. If your PICC is near or at the bend of your elbow, avoid activity with repeated motion at the elbow. Avoid lifting heavy objects as told by your health care provider. Keep all follow-up visits. This is important. You will need to have your PICC dressing changed at least once a week. Contact a health care provider if: You have pain in your arm, ear, face, or teeth. You have a fever or chills. You have redness, swelling, or pain around the insertion site. You have fluid or blood coming from the insertion site. Your insertion site feels warm to the touch. You have pus or a bad smell coming from the insertion site. Your skin feels hard and raised around the insertion site. Your PICC dressing has gotten wet or is coming off and you have not been taught how to change it. Get help right away if: You have problems with your PICC, such as  your PICC: Was tugged or pulled and has partially come out. Do not  push the PICC back in. Cannot be flushed, is hard to flush, or leaks around the insertion site when it is flushed. Makes a flushing sound when it is flushed. Appears to have a hole or tear. Is accidentally pulled all the way out. If this happens, cover the insertion site with a gauze dressing. Do not throw the PICC away. Your health care provider will need to check it to be sure the entire catheter came out. You feel your heart racing or skipping beats, or you have chest pain. You have shortness of breath or trouble breathing. You have swelling, redness, warmth, or pain in the arm in which the PICC is placed. You have a red streak going up your arm that   starts under the PICC dressing. These symptoms may be an emergency. Get help right away. Call 911. Do not wait to see if the symptoms will go away. Do not drive yourself to the hospital. Summary A peripherally inserted central catheter (PICC) is a long, thin, flexible tube (catheter) that is put into a vein in the arm or leg. If cared for properly, a PICC can remain in place for many months. Having a PICC can allow you to go home from the hospital sooner and continue treatment at home. The PICC is inserted using a germ-free (sterile) technique by a specially trained health care provider. Only a trained health care provider should remove it. Do not have your blood pressure checked on the arm in which your PICC is placed. Always keep your PICC identification card with you. This information is not intended to replace advice given to you by your health care provider. Make sure you discuss any questions you have with your health care provider. Document Revised: 07/18/2020 Document Reviewed: 07/18/2020 Elsevier Patient Education  2022 [employer]  

## 2021-02-20 NOTE — Progress Notes (Signed)
During nursing assessment, patient stated she has been having "hot flashes" that last for long periods of time.  She stated she has had hot flashes before but they do not usually last very long.  Patient stated they started yesterday (02/20/21).  Patient stated she has had some congestion, runny nose, and also a sore throat.  Patient stated she took her temp which was 99.3 F prior to coming to appointment today.  Temp was taken in infusion and was 99.4 F.  Patient denied being in contact with anyone that has tested positive for covid. Informed patient that her "hot flashes" were most likely the cause of a fever/chills.  Instructed patient to get tested for covid (instructions given on where to get test), and to contact office if the test results were positive and/or if her symptoms become worse (fever greater than 100.5, difficulty breathing). Informed patient she can take OTC tylenol or ibuprofen for her fever. Patient verbalized understanding.  Dr. Gearldine Shown LPN made aware of patient assessment and agreed to relay information to Dr. Benay Spice.  No new orders were received during patient's visit.

## 2021-02-27 ENCOUNTER — Other Ambulatory Visit: Payer: Self-pay

## 2021-02-27 ENCOUNTER — Inpatient Hospital Stay: Payer: Medicaid Other

## 2021-02-27 DIAGNOSIS — C20 Malignant neoplasm of rectum: Secondary | ICD-10-CM

## 2021-02-27 DIAGNOSIS — Z5111 Encounter for antineoplastic chemotherapy: Secondary | ICD-10-CM | POA: Diagnosis not present

## 2021-02-27 DIAGNOSIS — Z95828 Presence of other vascular implants and grafts: Secondary | ICD-10-CM

## 2021-02-27 MED ORDER — HEPARIN SOD (PORK) LOCK FLUSH 100 UNIT/ML IV SOLN
500.0000 [IU] | Freq: Once | INTRAVENOUS | Status: AC
  Administered 2021-02-27: 250 [IU]

## 2021-02-27 MED ORDER — SODIUM CHLORIDE 0.9% FLUSH
10.0000 mL | Freq: Once | INTRAVENOUS | Status: AC
  Administered 2021-02-27: 10 mL

## 2021-02-27 NOTE — Patient Instructions (Signed)
PICC Insertion, Care After The following information offers guidance on how to care for yourself after your procedure. Your health care provider may also give you more specific instructions. If you have problems or questions, contact your health care provider. What can I expect after the procedure? After the procedure, it is common to have mild discomfort and bruising at your insertion site. Follow these instructions at home: Bathing  Do not take baths, swim, or use a hot tub until your health care provider approves. Ask your health care provider if you may take showers. You may only be allowed to take sponge baths. Keep the bandage (dressing) dry. If it gets wet, it needs to be changed right away to help prevent infection. Activity  Rest as told by your health care provider. Ask your health care provider when you can return to your normal activities. If your PICC is near or at the bend of your elbow, avoid activity with repeated motion at the elbow. Do not lift anything that is heavier than 10 lb (4.5 kg), or the limit that you are told, until your health care provider says that it is safe. If you use a crutch, do not use it with the arm on the same side as your PICC. You may need to use a walker instead. Avoid any activity that requires great effort as told by your health care provider. General instructions If you were given a sedative during the procedure, it can affect you for several hours. Do not drive or operate machinery until your health care provider says that it is safe. Take over-the-counter and prescription medicines only as told by your health care provider. Flush the PICC as told by your health care provider. Let your health care provider know right away if the PICC is hard to flush or does not flush. Do not use force to flush the PICC. Do not use any products that contain nicotine or tobacco. These products include cigarettes, chewing tobacco, and vaping devices, such as  e-cigarettes. These can delay healing after surgery. If you need help quitting, ask your health care provider. Check your insertion site every day for signs of infection. Check for: More redness, swelling, or pain. Fluid or blood. Warmth. Pus or a bad smell. Keep all follow-up visits. This is important. You will need to have your PICC dressing changed at least once a week. Contact a health care provider if: You have pain in your arm, ear, face, or teeth. You have a fever or chills. You have more redness, swelling, or pain around your insertion site. You have fluid or blood coming from your insertion site. Your insertion site feels warm to the touch. You have pus or a bad smell coming from your insertion site. Your vein feels hard and raised like a cord under the skin around your insertion site. Your PICC dressing has gotten wet. Your PICC dressing is coming off. Tape it on until it can be changed. Get help right away if: You have problems with the PICC. These include if your PICC: Is accidentally pulled all the way out. If this happens, cover the insertion site with a gauze dressing. Do not throw the PICC away. Your health care provider will need to check it to be sure the entire catheter came out. Cannot be flushed, is hard to flush, or leaks around the insertion site when flushed. Was tugged or pulled and has partially come out. Do not push the PICC back in. Makes a flushing sound when  flushed. Appears to have a hole or tear. You feel your heart racing or skipping beats, or you have chest pain. You have swelling, redness, warmth, or pain in the arm or leg where the PICC is placed. You have a red streak going up your arm that starts under your PICC dressing. You have numbness or tingling in your fingers, hand, or arm. These symptoms may be an emergency. Get help right away. Call 911. Do not wait to see if the symptoms will go away. Do not drive yourself to the hospital. Summary After  your procedure, it is common to have mild discomfort and bruising at your insertion site. Do not lift anything that is heavier than 10 lb (4.5 kg), or the limit that you are told, until your health care provider says that it is safe. Flush the PICC as told by your health care provider. Tell your health care provider right away if the PICC is hard to flush or does not flush. Do not use force to flush the PICC. Check your insertion site every day for signs of infection. This information is not intended to replace advice given to you by your health care provider. Make sure you discuss any questions you have with your health care provider. Document Revised: 07/18/2020 Document Reviewed: 07/18/2020 Elsevier Patient Education  [place].

## 2021-03-03 ENCOUNTER — Other Ambulatory Visit: Payer: Self-pay | Admitting: Oncology

## 2021-03-04 ENCOUNTER — Inpatient Hospital Stay: Payer: Medicaid Other

## 2021-03-04 ENCOUNTER — Other Ambulatory Visit (HOSPITAL_BASED_OUTPATIENT_CLINIC_OR_DEPARTMENT_OTHER): Payer: Self-pay

## 2021-03-04 ENCOUNTER — Inpatient Hospital Stay (HOSPITAL_BASED_OUTPATIENT_CLINIC_OR_DEPARTMENT_OTHER): Payer: Medicaid Other | Admitting: Nurse Practitioner

## 2021-03-04 ENCOUNTER — Encounter: Payer: Self-pay | Admitting: Nurse Practitioner

## 2021-03-04 ENCOUNTER — Other Ambulatory Visit: Payer: Self-pay

## 2021-03-04 VITALS — BP 134/90 | HR 95 | Temp 97.8°F | Resp 18 | Ht 65.0 in | Wt 165.0 lb

## 2021-03-04 DIAGNOSIS — Z5111 Encounter for antineoplastic chemotherapy: Secondary | ICD-10-CM | POA: Diagnosis not present

## 2021-03-04 DIAGNOSIS — C19 Malignant neoplasm of rectosigmoid junction: Secondary | ICD-10-CM

## 2021-03-04 DIAGNOSIS — C20 Malignant neoplasm of rectum: Secondary | ICD-10-CM

## 2021-03-04 LAB — CBC WITH DIFFERENTIAL (CANCER CENTER ONLY)
Abs Immature Granulocytes: 0.01 10*3/uL (ref 0.00–0.07)
Basophils Absolute: 0.1 10*3/uL (ref 0.0–0.1)
Basophils Relative: 1 %
Eosinophils Absolute: 0.2 10*3/uL (ref 0.0–0.5)
Eosinophils Relative: 4 %
HCT: 43.1 % (ref 36.0–46.0)
Hemoglobin: 14.5 g/dL (ref 12.0–15.0)
Immature Granulocytes: 0 %
Lymphocytes Relative: 45 %
Lymphs Abs: 2.6 10*3/uL (ref 0.7–4.0)
MCH: 30.5 pg (ref 26.0–34.0)
MCHC: 33.6 g/dL (ref 30.0–36.0)
MCV: 90.7 fL (ref 80.0–100.0)
Monocytes Absolute: 0.4 10*3/uL (ref 0.1–1.0)
Monocytes Relative: 7 %
Neutro Abs: 2.5 10*3/uL (ref 1.7–7.7)
Neutrophils Relative %: 43 %
Platelet Count: 328 10*3/uL (ref 150–400)
RBC: 4.75 MIL/uL (ref 3.87–5.11)
RDW: 14.6 % (ref 11.5–15.5)
WBC Count: 5.8 10*3/uL (ref 4.0–10.5)
nRBC: 0 % (ref 0.0–0.2)

## 2021-03-04 LAB — CMP (CANCER CENTER ONLY)
ALT: 8 U/L (ref 0–44)
AST: 14 U/L — ABNORMAL LOW (ref 15–41)
Albumin: 4.5 g/dL (ref 3.5–5.0)
Alkaline Phosphatase: 138 U/L — ABNORMAL HIGH (ref 38–126)
Anion gap: 9 (ref 5–15)
BUN: 9 mg/dL (ref 6–20)
CO2: 31 mmol/L (ref 22–32)
Calcium: 10.3 mg/dL (ref 8.9–10.3)
Chloride: 99 mmol/L (ref 98–111)
Creatinine: 0.82 mg/dL (ref 0.44–1.00)
GFR, Estimated: >60 mL/min (ref >60)
Glucose, Bld: 132 mg/dL — ABNORMAL HIGH (ref 70–99)
Potassium: 3 mmol/L — ABNORMAL LOW (ref 3.5–5.1)
Sodium: 139 mmol/L (ref 135–145)
Total Bilirubin: 0.3 mg/dL (ref 0.3–1.2)
Total Protein: 7.7 g/dL (ref 6.5–8.1)

## 2021-03-04 LAB — CEA (ACCESS): CEA (CHCC): 2.01 ng/mL (ref 0.00–5.00)

## 2021-03-04 LAB — MAGNESIUM: Magnesium: 1.8 mg/dL (ref 1.7–2.4)

## 2021-03-04 MED ORDER — SODIUM CHLORIDE 0.9 % IV SOLN
1800.0000 mg/m2 | INTRAVENOUS | Status: DC
  Administered 2021-03-04: 3350 mg via INTRAVENOUS
  Filled 2021-03-04: qty 67

## 2021-03-04 MED ORDER — HYDROCODONE-ACETAMINOPHEN 5-325 MG PO TABS
1.0000 | ORAL_TABLET | Freq: Four times a day (QID) | ORAL | 0 refills | Status: DC | PRN
  Filled 2021-03-04: qty 30, 8d supply, fill #0

## 2021-03-04 MED ORDER — SODIUM CHLORIDE 0.9 % IV SOLN
6.0000 mg/kg | Freq: Once | INTRAVENOUS | Status: AC
  Administered 2021-03-04: 440 mg via INTRAVENOUS
  Filled 2021-03-04: qty 20

## 2021-03-04 MED ORDER — ALUM & MAG HYDROXIDE-SIMETH 200-200-20 MG/5ML PO SUSP
Freq: Two times a day (BID) | ORAL | 0 refills | Status: DC
Start: 2021-03-04 — End: 2021-05-27
  Filled 2021-03-04: qty 240, 4d supply, fill #0

## 2021-03-04 MED ORDER — PALONOSETRON HCL INJECTION 0.25 MG/5ML
0.2500 mg | Freq: Once | INTRAVENOUS | Status: AC
  Administered 2021-03-04: 0.25 mg via INTRAVENOUS
  Filled 2021-03-04: qty 5

## 2021-03-04 MED ORDER — SODIUM CHLORIDE 0.9 % IV SOLN
10.0000 mg | Freq: Once | INTRAVENOUS | Status: AC
  Administered 2021-03-04: 10 mg via INTRAVENOUS
  Filled 2021-03-04: qty 1

## 2021-03-04 MED ORDER — SODIUM CHLORIDE 0.9 % IV SOLN: Freq: Once | INTRAVENOUS | Status: AC

## 2021-03-04 MED ORDER — ATROPINE SULFATE 1 MG/ML IV SOLN
0.5000 mg | Freq: Once | INTRAVENOUS | Status: AC
  Administered 2021-03-04: 0.5 mg via INTRAVENOUS
  Filled 2021-03-04: qty 1

## 2021-03-04 MED ORDER — SODIUM CHLORIDE 0.9 % IV SOLN
150.0000 mg | Freq: Once | INTRAVENOUS | Status: AC
  Administered 2021-03-04: 150 mg via INTRAVENOUS
  Filled 2021-03-04: qty 5

## 2021-03-04 MED ORDER — SODIUM CHLORIDE 0.9 % IV SOLN
125.0000 mg/m2 | Freq: Once | INTRAVENOUS | Status: AC
  Administered 2021-03-04: 240 mg via INTRAVENOUS
  Filled 2021-03-04: qty 10

## 2021-03-04 MED ORDER — SODIUM CHLORIDE 0.9 % IV SOLN
200.0000 mg/m2 | Freq: Once | INTRAVENOUS | Status: AC
  Administered 2021-03-04: 372 mg via INTRAVENOUS
  Filled 2021-03-04: qty 18.6

## 2021-03-04 MED ORDER — FLUOROURACIL CHEMO INJECTION 500 MG/10ML
200.0000 mg/m2 | Freq: Once | INTRAVENOUS | Status: AC
  Administered 2021-03-04: 350 mg via INTRAVENOUS
  Filled 2021-03-04: qty 7

## 2021-03-04 NOTE — Progress Notes (Signed)
[facility] OFFICE PROGRESS NOTE   Diagnosis:  Colon cancer  INTERVAL HISTORY:   [name] returns as scheduled.  She completed another cycle of FOLFIRI/Panitumumab 02/11/2021.  No significant nausea/vomiting.  No mouth sores.  Periodic loose stools.  Rash is stable to improved.  She continues to note skin irritation and pruritus around the PICC site.  Occasional stinging sensation.  No pain.  No fever.  Objective:  Vital signs in last 24 hours:  Blood pressure 134/90, pulse 95, temperature 97.8 F (36.6 C), temperature source Oral, resp. rate 18, height 5' 5" (1.651 m), weight 165 lb (74.8 kg), last menstrual period 01/07/2006, SpO2 100 %.    HEENT: White coating of her tongue. Resp: Lungs clear bilaterally. Cardio: Regular rate and rhythm. GI: Abdomen soft and nontender.  No hepatomegaly. Vascular: No leg edema. Skin: Mild acne type skin rash on the face. Right upper extremity PICC site with surrounding skin irritation.  Lab Results:  Lab Results  Component Value Date   WBC 5.8 03/04/2021   HGB 14.5 03/04/2021   HCT 43.1 03/04/2021   MCV 90.7 03/04/2021   PLT 328 03/04/2021   NEUTROABS 2.5 03/04/2021    Imaging:  No results found.  Medications: I have reviewed the patient's current medications.  Assessment/Plan: Sigmoid colon cancer, stage IV (pT4a,pN2b,M1c) Colonoscopy 03/24/2019-3 rectal polyps-hyperplastic polyps, distal colon biopsy-at least intramucosal adenocarcinoma, completely obstructing mass in the distal sigmoid colon, could not be traversed, intact mismatch repair protein expression 03/24/2019-CEA 56.1 03/29/2019 CT abdomen/pelvis-circumferential thickening involving the entire mid and distal sigmoid colon to the level of the rectosigmoid junction, solid/cystic mass in the left ovary, bilateral nephrolithiasis Robotic assisted low anterior resection, mesenteric lymphadenectomy, bilateral salpingo-oophorectomy 04/08/2019 Pathology ([facility]  review of outside pathology) metastatic adenocarcinoma involving the peritoneum overlying the round ligament, serosa of the urinary bladder, serosa of the right ureter a sacral area, pelvic peritoneum, and left ovary.  Omental biopsy with focal mucin pools with no carcinoma cells identified, right hemidiaphragm biopsy involved by metastatic adenocarcinoma, invasive adenocarcinoma the sigmoid colon, moderately differentiated, T4a, perineural and vascular invasion present, 7/22 lymph nodes, multiple tumor deposits, resection margins negative Negative for PD-L1, low probability of MSI-high, HER-2 negative, negative for BRAF, NRAS and KRAS alterations CTs 05/19/2019-no evidence of metastatic disease, findings suspicious for colitis of the transverse and ascending colon, small amount of ascites in the cul-de-sac, bilateral renal calculi Cycle 1 FOLFIRI 05/24/2019, bevacizumab added with cycle 2 Cycle 4 FOLFIRI/bevacizumab 07/05/2019 Cycle 5 FOLFIRI 07/27/2019, bevacizumab held secondary to hypertension Cycle 6 FOLFIRI 08/10/1999, bevacizumab held secondary to hypertension CTs 08/30/2019-no evidence of recurrent disease, new subsolid right lower lobe nodule felt to be inflammatory, emphysema Cycle 7 FOLFIRI 09/26/2019, bevacizumab remains on hold secondary to hypertension Cycle 8 FOLFIRI 10/17/2019, bevacizumab held secondary to hypertension, Udenyca added for neutropenia Cycle 9 FOLFIRI 11/07/2019, bevacizumab held secondary to hypertension, Udenyca Cycle 10 FOLFIRI 11/28/2019, bevacizumab held, Udenyca Cycle 11 FOLFIRI 12/26/2019, bevacizumab held, Udenyca CTs 01/25/2020-no evidence of recurrent disease, resolution of right lower lobe nodule Maintenance Xeloda beginning 02/06/2020 CTs 04/25/2020- no evidence of metastatic disease, multiple bilateral renal calculi without hydronephrosis Maintenance Xeloda continued CT abdomen/pelvis without contrast 08/10/2020-bilateral staghorn renal calculi, no evidence of  metastatic disease; addendum 08/23/2020-small but increasing omental nodules. Cycle 1 FOLFIRI/panitumumab 09/19/2020 Cycle 2 FOLFIRI/Panitumumab 10/03/2020 Cycle 3 FOLFIRI/Panitumumab 10/17/2020 Cycle 4 FOLFIRI/panitumumab 10/31/2020 Cycle 5 FOLFIRI/Panitumumab 11/14/2020 CTs 11/26/2020-stable omental metastases compared to 10/22/2020, mildly decreased from 08/10/2020.  Left lower quadrant tiny paracolic gutter implant  slightly decreased from CT 08/10/2020.  No new or progressive metastatic disease in the abdomen or pelvis. Cycle 6 FOLFIRI/Panitumumab 11/28/2020, irinotecan dose reduced, treatment schedule adjusted to every 3 weeks going forward Cycle 7 FOLFIRI/panitumumab 12/24/2020 Cycle 8 FOLFIRI/Panitumumab 01/16/2021--treatment held due to hypertension in the infusion area. Cycle 8 FOLFIRI/panitumumab 01/21/2021 Cycle 9 FOLFIRI/panitumumab 02/11/2021 Cycle 10 FOLFIRI/Panitumumab 03/04/2021   Hypertension G4 P3, twins Kidney stones-bilateral staghorn renal calculi on CT 08/10/2020 Infected Port-A-Cath 09/12/2019-placed on Augmentin, referred for Port-A-Cath removal; Port-A-Cath removed 09/14/2019; PICC line placed 09/14/2019; culture staph aureus.  Course of Septra completed. Neutropenia secondary to chemotherapy-Udenyca added with cycle 8 FOLFIRI Right nephrostomy tube 09/12/2020; stent placed 10/09/2020, percutaneous nephrostolithotomy treatment of right-sided kidney stones 10/09/2020, malpositioned right ureter stent replacement 10/23/2020, right ureter stent removed 10/29/2020    Disposition: April [name] appears stable.  She continues on active treatment with FOLFIRI/Panitumumab.  Plan to proceed with cycle 10 today as scheduled.  Restaging CT scans after cycle 11.  CBC and chemistry panel reviewed.  Labs adequate to proceed with treatment.  She has persistent hypokalemia.  She will increase K-Dur to 20 mEq twice daily for the next 3 days, then resume 20 mEq daily.  She will return for lab, follow-up,  cycle 11 FOLFIRI/Panitumumab in 3 weeks.  We are available to see her sooner if needed.    Ned Card ANP/GNP-BC   03/04/2021  10:12 AM

## 2021-03-04 NOTE — Patient Instructions (Signed)
PICC Home Care Guide A peripherally inserted central catheter (PICC) is a form of IV access that allows medicines and IV fluids to be quickly put into the blood and spread throughout the body. The PICC is a long, thin, flexible tube (catheter) that is put into a vein in a person's arm or leg. The catheter ends in a large vein just outside the heart called the superior vena cava (SVC). After the PICC is put in, a chest X-ray may be done to make sure that it is in the right place. A PICC may be placed for different reasons, such as: To give medicines and liquid nutrition. To give IV fluids and blood products. To take blood samples often. If there is trouble placing a peripheral intravenous (PIV) catheter. If cared for properly, a PICC can remain in place for many months. Having a PICC can allow you to go home from the hospital sooner and continue treatment at home. Medicines and PICC care can be managed at home by a family member, caregiver, or home health care team. What are the risks? Generally, having a PICC is safe. However, problems may occur, including: A blood clot (thrombus) forming in or at the end of the PICC. A blood clot forming in a vein (deep vein thrombosis) or traveling to the lung (pulmonary embolism). Inflammation of the vein (phlebitis) in which the PICC is placed. Infection at the insertion site or in the blood. Blood infections from central lines, like PICCs, can be serious and often require a hospital stay. PICC malposition, or PICC movement or poor placement. A break or cut in the PICC. Do not use scissors near the PICC. Nerve or tendon irritation or injury during PICC insertion. How to care for your PICC Please follow the specific guidelines provided by your health care provider. Preventing infection You and any caregivers should wash your hands often with soap and water for at least 20 seconds. Wash hands: Before touching the PICC or the infusion device. Before changing a  bandage (dressing). Do not change the dressing unless you have been taught to do so and have shown you are able to change it safely. Flush the PICC as told. Tell your health care provider right away if the PICC is hard to flush or does not flush. Do not use force to flush the PICC. Use clean and germ-free (sterile) supplies only. Keep the supplies in a dry place. Do not reuse needles, syringes, or any other supplies. Reusing supplies can lead to infection. Keep the PICC dressing dry and secure it with tape if the edges stop sticking to your skin. Check your PICC insertion site every day for signs of infection. Check for: Redness, swelling, or pain. Fluid or blood. Warmth. Pus or a bad smell. Preventing other problems Do not use a syringe that is less than 10 mL to flush the PICC. Do not have your blood pressure checked on the arm in which the PICC is placed. Do not ever pull or tug on the PICC. Keep it secured to your arm with tape or a stretch wrap when not in use. Do not take the PICC out yourself. Only a trained health care provider should remove the PICC. Keep pets and children away from your PICC. How to care for your PICC dressing Keep your PICC dressing clean and dry to prevent infection. Do not take baths, swim, or use a hot tub until your health care provider approves. Ask your health care provider if you can take   showers. You may only be allowed to take sponge baths. When you are allowed to shower: Ask your health care provider to teach you how to wrap the PICC. Cover the PICC with clear plastic wrap and tape to keep it dry while showering. Follow instructions from your health care provider about how to take care of your insertion site and dressing. Make sure you: Wash your hands with soap and water for at least 20 seconds before and after you change your dressing. If soap and water are not available, use hand sanitizer. Change your dressing only if taught to do so by your health care  provider. Your PICC dressing needs to be changed if it becomes loose or wet. Leave stitches (sutures), skin glue, or adhesive strips in place. These skin closures may need to stay in place for 2 weeks or longer. If adhesive strip edges start to loosen and curl up, you may trim the loose edges. Do not remove adhesive strips completely unless your health care provider tells you to do that. Follow these instructions at home: Disposal of supplies Throw away any syringes in a disposal container that is meant for sharp items (sharps container). You can buy a sharps container from a pharmacy, or you can make one by using an empty, hard plastic bottle with a lid. Place any used dressings or infusion bags into a plastic bag. Throw that bag in the trash. General instructions  Always carry your PICC identification card or wear a medical alert bracelet. Keep the tube clamped at all times, unless it is being used. Always carry a smooth-edge clamp with you to clamp the PICC if it breaks. Do not use scissors or sharp objects near the tube. You may bend your arm and move it freely. If your PICC is near or at the bend of your elbow, avoid activity with repeated motion at the elbow. Avoid lifting heavy objects as told by your health care provider. Keep all follow-up visits. This is important. You will need to have your PICC dressing changed at least once a week. Contact a health care provider if: You have pain in your arm, ear, face, or teeth. You have a fever or chills. You have redness, swelling, or pain around the insertion site. You have fluid or blood coming from the insertion site. Your insertion site feels warm to the touch. You have pus or a bad smell coming from the insertion site. Your skin feels hard and raised around the insertion site. Your PICC dressing has gotten wet or is coming off and you have not been taught how to change it. Get help right away if: You have problems with your PICC, such as  your PICC: Was tugged or pulled and has partially come out. Do not  push the PICC back in. Cannot be flushed, is hard to flush, or leaks around the insertion site when it is flushed. Makes a flushing sound when it is flushed. Appears to have a hole or tear. Is accidentally pulled all the way out. If this happens, cover the insertion site with a gauze dressing. Do not throw the PICC away. Your health care provider will need to check it to be sure the entire catheter came out. You feel your heart racing or skipping beats, or you have chest pain. You have shortness of breath or trouble breathing. You have swelling, redness, warmth, or pain in the arm in which the PICC is placed. You have a red streak going up your arm that   starts under the PICC dressing. These symptoms may be an emergency. Get help right away. Call 911. Do not wait to see if the symptoms will go away. Do not drive yourself to the hospital. Summary A peripherally inserted central catheter (PICC) is a long, thin, flexible tube (catheter) that is put into a vein in the arm or leg. If cared for properly, a PICC can remain in place for many months. Having a PICC can allow you to go home from the hospital sooner and continue treatment at home. The PICC is inserted using a germ-free (sterile) technique by a specially trained health care provider. Only a trained health care provider should remove it. Do not have your blood pressure checked on the arm in which your PICC is placed. Always keep your PICC identification card with you. This information is not intended to replace advice given to you by your health care provider. Make sure you discuss any questions you have with your health care provider. Document Revised: 07/18/2020 Document Reviewed: 07/18/2020 Elsevier Patient Education  2022 [employer]  

## 2021-03-04 NOTE — Patient Instructions (Signed)
[facility]  Discharge Instructions: Thank you for choosing [facility] to provide your oncology and hematology care.   If you have a lab appointment with the [facility], please go directly to the [facility] and check in at the registration area.   Wear comfortable clothing and clothing appropriate for easy access to any Portacath or PICC line.   We strive to give you quality time with your provider. You may need to reschedule your appointment if you arrive late (15 or more minutes).  Arriving late affects you and other patients whose appointments are after yours.  Also, if you miss three or more appointments without notifying the office, you may be dismissed from the clinic at the providers discretion.      For prescription refill requests, have your pharmacy contact our office and allow 72 hours for refills to be completed.    Today you received the following chemotherapy and/or immunotherapy agents Vectibix, Fofiri      To help prevent nausea and vomiting after your treatment, we encourage you to take your nausea medication as directed.  BELOW ARE SYMPTOMS THAT SHOULD BE REPORTED IMMEDIATELY: *FEVER GREATER THAN 100.4 F (38 C) OR HIGHER *CHILLS OR SWEATING *NAUSEA AND VOMITING THAT IS NOT CONTROLLED WITH YOUR NAUSEA MEDICATION *UNUSUAL SHORTNESS OF BREATH *UNUSUAL BRUISING OR BLEEDING *URINARY PROBLEMS (pain or burning when urinating, or frequent urination) *BOWEL PROBLEMS (unusual diarrhea, constipation, pain near the anus) TENDERNESS IN MOUTH AND THROAT WITH OR WITHOUT PRESENCE OF ULCERS (sore throat, sores in mouth, or a toothache) UNUSUAL RASH, SWELLING OR PAIN  UNUSUAL VAGINAL DISCHARGE OR ITCHING   Items with * indicate a potential emergency and should be followed up as soon as possible or go to the Emergency Department if any problems should occur.  Please show the CHEMOTHERAPY ALERT CARD or IMMUNOTHERAPY ALERT CARD at check-in  to the Emergency Department and triage nurse.  Should you have questions after your visit or need to cancel or reschedule your appointment, please contact [facility]  Dept: [phone]  and follow the prompts.  Office hours are 8:00 a.m. to 4:30 p.m. Monday - Friday. Please note that voicemails left after 4:00 p.m. may not be returned until the following business day.  We are closed weekends and major holidays. You have access to a nurse at all times for urgent questions. Please call the main number to the clinic Dept: [phone] and follow the prompts.   For any non-urgent questions, you may also contact your provider using MyChart. We now offer e-Visits for anyone 51 and older to request care online for non-urgent symptoms. For details visit [email or web address].   Also download the MyChart app! Go to the app store, search "MyChart", open the app, select [facility], and log in with your MyChart username and password.  Due to Covid, a mask is required upon entering the hospital/clinic. If you do not have a mask, one will be given to you upon arrival. For doctor visits, patients may have 1 support person aged 82 or older with them. For treatment visits, patients cannot have anyone with them due to current Covid guidelines and our immunocompromised population.

## 2021-03-04 NOTE — Progress Notes (Signed)
Patient presents for treatment. RN assessment completed along with the following:  Labs/vitals reviewed - Yes, and Per Ned Card, NP ok to treat with K 3.0.     Weight within 10% of previous measurement - Yes Informed consent completed and reflects current /therapy/intent - Yes, on date 08/19/20             Provider progress note reviewed -  Yes, today's provider note was reviewed. Treatment/Antibody/Supportive plan reviewed - Yes, and there are no adjustments needed for today's treatment. S&H and other orders reviewed - Yes, and there are no additional orders identified. Previous treatment date reviewed - Yes, and the appropriate amount of time has elapsed between treatments. Clinic Hand Off Received from -Lupita Raider, RN  Patient to proceed with treatment.    Patient's dressing changed during visit today.  Ned Card, NP assessed skin site after dressing as removed. Top of dressing and biopatch was wet, but it did not appear to be any weeping from the skin.  Skin was darker in color around PICC insertion site but skin was intact with no evidence of drainage.  Patient stated she covers picc site with bag and secures it with tape during showers, but allows the bag to become saturate.  Instructed patient to try to keep arm with picc as dry as possible during shower and to not fully saturate it with water, even if the bag is covering the arm.  Patient verbalized understanding and agreed to keep arm dry during showers.

## 2021-03-04 NOTE — Progress Notes (Signed)
Patient seen by Ned Card NP today  Vitals are within treatment parameters.  Labs reviewed by Ned Card NP and are within treatment parameters. K+ 3.0. Patient to increase home supply of K+ to twice a day x 3 days and then return to normal dosing.   Per physician team, patient is ready for treatment and there are NO modifications to the treatment plan.

## 2021-03-06 ENCOUNTER — Inpatient Hospital Stay: Payer: Medicaid Other

## 2021-03-06 ENCOUNTER — Other Ambulatory Visit: Payer: Self-pay

## 2021-03-06 VITALS — BP 124/92 | HR 82 | Resp 18

## 2021-03-06 DIAGNOSIS — Z5111 Encounter for antineoplastic chemotherapy: Secondary | ICD-10-CM | POA: Diagnosis not present

## 2021-03-06 DIAGNOSIS — C20 Malignant neoplasm of rectum: Secondary | ICD-10-CM

## 2021-03-06 MED ORDER — SODIUM CHLORIDE 0.9% FLUSH
10.0000 mL | INTRAVENOUS | Status: DC | PRN
  Administered 2021-03-06: 10 mL

## 2021-03-06 MED ORDER — HEPARIN SOD (PORK) LOCK FLUSH 100 UNIT/ML IV SOLN
250.0000 [IU] | Freq: Once | INTRAVENOUS | Status: AC | PRN
  Administered 2021-03-06: 250 [IU]

## 2021-03-06 NOTE — Progress Notes (Signed)
PICC line dressing assessed during visit today.  Dressing was clean, dry, and intact with no evidence of moisture.  Patient verbalized that she was making sure to not get the dressing wet when showering.  Patient also stated that she has noticed the dressing/picc site was not as itchy as it has been in the past.   Patient also asked for clarification of when to take her doxycycline.  Per MD orders, she needs to take 100mg  two times daily PO.  Patient stated she was originally only taking once daily the day before chemo and the day of chemo. Patient verbalized understanding with the MD order and will start taking it two times daily every day as prescribed.   Patient instructed to call office if she has any additional questions or concerns.

## 2021-03-06 NOTE — Patient Instructions (Signed)
PICC Home Care Guide A peripherally inserted central catheter (PICC) is a form of IV access that allows medicines and IV fluids to be quickly put into the blood and spread throughout the body. The PICC is a long, thin, flexible tube (catheter) that is put into a vein in a person's arm or leg. The catheter ends in a large vein just outside the heart called the superior vena cava (SVC). After the PICC is put in, a chest X-ray may be done to make sure that it is in the right place. A PICC may be placed for different reasons, such as: To give medicines and liquid nutrition. To give IV fluids and blood products. To take blood samples often. If there is trouble placing a peripheral intravenous (PIV) catheter. If cared for properly, a PICC can remain in place for many months. Having a PICC can allow you to go home from the hospital sooner and continue treatment at home. Medicines and PICC care can be managed at home by a family member, caregiver, or home health care team. What are the risks? Generally, having a PICC is safe. However, problems may occur, including: A blood clot (thrombus) forming in or at the end of the PICC. A blood clot forming in a vein (deep vein thrombosis) or traveling to the lung (pulmonary embolism). Inflammation of the vein (phlebitis) in which the PICC is placed. Infection at the insertion site or in the blood. Blood infections from central lines, like PICCs, can be serious and often require a hospital stay. PICC malposition, or PICC movement or poor placement. A break or cut in the PICC. Do not use scissors near the PICC. Nerve or tendon irritation or injury during PICC insertion. How to care for your PICC Please follow the specific guidelines provided by your health care provider. Preventing infection You and any caregivers should wash your hands often with soap and water for at least 20 seconds. Wash hands: Before touching the PICC or the infusion device. Before changing a  bandage (dressing). Do not change the dressing unless you have been taught to do so and have shown you are able to change it safely. Flush the PICC as told. Tell your health care provider right away if the PICC is hard to flush or does not flush. Do not use force to flush the PICC. Use clean and germ-free (sterile) supplies only. Keep the supplies in a dry place. Do not reuse needles, syringes, or any other supplies. Reusing supplies can lead to infection. Keep the PICC dressing dry and secure it with tape if the edges stop sticking to your skin. Check your PICC insertion site every day for signs of infection. Check for: Redness, swelling, or pain. Fluid or blood. Warmth. Pus or a bad smell. Preventing other problems Do not use a syringe that is less than 10 mL to flush the PICC. Do not have your blood pressure checked on the arm in which the PICC is placed. Do not ever pull or tug on the PICC. Keep it secured to your arm with tape or a stretch wrap when not in use. Do not take the PICC out yourself. Only a trained health care provider should remove the PICC. Keep pets and children away from your PICC. How to care for your PICC dressing Keep your PICC dressing clean and dry to prevent infection. Do not take baths, swim, or use a hot tub until your health care provider approves. Ask your health care provider if you can take   showers. You may only be allowed to take sponge baths. When you are allowed to shower: Ask your health care provider to teach you how to wrap the PICC. Cover the PICC with clear plastic wrap and tape to keep it dry while showering. Follow instructions from your health care provider about how to take care of your insertion site and dressing. Make sure you: Wash your hands with soap and water for at least 20 seconds before and after you change your dressing. If soap and water are not available, use hand sanitizer. Change your dressing only if taught to do so by your health care  provider. Your PICC dressing needs to be changed if it becomes loose or wet. Leave stitches (sutures), skin glue, or adhesive strips in place. These skin closures may need to stay in place for 2 weeks or longer. If adhesive strip edges start to loosen and curl up, you may trim the loose edges. Do not remove adhesive strips completely unless your health care provider tells you to do that. Follow these instructions at home: Disposal of supplies Throw away any syringes in a disposal container that is meant for sharp items (sharps container). You can buy a sharps container from a pharmacy, or you can make one by using an empty, hard plastic bottle with a lid. Place any used dressings or infusion bags into a plastic bag. Throw that bag in the trash. General instructions  Always carry your PICC identification card or wear a medical alert bracelet. Keep the tube clamped at all times, unless it is being used. Always carry a smooth-edge clamp with you to clamp the PICC if it breaks. Do not use scissors or sharp objects near the tube. You may bend your arm and move it freely. If your PICC is near or at the bend of your elbow, avoid activity with repeated motion at the elbow. Avoid lifting heavy objects as told by your health care provider. Keep all follow-up visits. This is important. You will need to have your PICC dressing changed at least once a week. Contact a health care provider if: You have pain in your arm, ear, face, or teeth. You have a fever or chills. You have redness, swelling, or pain around the insertion site. You have fluid or blood coming from the insertion site. Your insertion site feels warm to the touch. You have pus or a bad smell coming from the insertion site. Your skin feels hard and raised around the insertion site. Your PICC dressing has gotten wet or is coming off and you have not been taught how to change it. Get help right away if: You have problems with your PICC, such as  your PICC: Was tugged or pulled and has partially come out. Do not  push the PICC back in. Cannot be flushed, is hard to flush, or leaks around the insertion site when it is flushed. Makes a flushing sound when it is flushed. Appears to have a hole or tear. Is accidentally pulled all the way out. If this happens, cover the insertion site with a gauze dressing. Do not throw the PICC away. Your health care provider will need to check it to be sure the entire catheter came out. You feel your heart racing or skipping beats, or you have chest pain. You have shortness of breath or trouble breathing. You have swelling, redness, warmth, or pain in the arm in which the PICC is placed. You have a red streak going up your arm that   starts under the PICC dressing. These symptoms may be an emergency. Get help right away. Call 911. Do not wait to see if the symptoms will go away. Do not drive yourself to the hospital. Summary A peripherally inserted central catheter (PICC) is a long, thin, flexible tube (catheter) that is put into a vein in the arm or leg. If cared for properly, a PICC can remain in place for many months. Having a PICC can allow you to go home from the hospital sooner and continue treatment at home. The PICC is inserted using a germ-free (sterile) technique by a specially trained health care provider. Only a trained health care provider should remove it. Do not have your blood pressure checked on the arm in which your PICC is placed. Always keep your PICC identification card with you. This information is not intended to replace advice given to you by your health care provider. Make sure you discuss any questions you have with your health care provider. Document Revised: 07/18/2020 Document Reviewed: 07/18/2020 Elsevier Patient Education  2022 [employer]  

## 2021-03-11 ENCOUNTER — Inpatient Hospital Stay: Payer: Medicaid Other

## 2021-03-11 ENCOUNTER — Other Ambulatory Visit: Payer: Self-pay

## 2021-03-11 VITALS — BP 138/95 | HR 108 | Temp 99.2°F | Resp 18

## 2021-03-11 DIAGNOSIS — Z5111 Encounter for antineoplastic chemotherapy: Secondary | ICD-10-CM | POA: Diagnosis not present

## 2021-03-11 DIAGNOSIS — Z95828 Presence of other vascular implants and grafts: Secondary | ICD-10-CM

## 2021-03-11 DIAGNOSIS — C20 Malignant neoplasm of rectum: Secondary | ICD-10-CM

## 2021-03-11 MED ORDER — SODIUM CHLORIDE 0.9% FLUSH
10.0000 mL | Freq: Once | INTRAVENOUS | Status: AC
  Administered 2021-03-11: 10 mL

## 2021-03-11 MED ORDER — HEPARIN SOD (PORK) LOCK FLUSH 100 UNIT/ML IV SOLN
500.0000 [IU] | Freq: Once | INTRAVENOUS | Status: AC
  Administered 2021-03-11: 500 [IU]

## 2021-03-18 ENCOUNTER — Inpatient Hospital Stay: Payer: Medicaid Other | Attending: Oncology

## 2021-03-18 ENCOUNTER — Other Ambulatory Visit: Payer: Self-pay

## 2021-03-18 VITALS — BP 138/98 | HR 92 | Temp 98.8°F | Resp 18

## 2021-03-18 DIAGNOSIS — C187 Malignant neoplasm of sigmoid colon: Secondary | ICD-10-CM | POA: Insufficient documentation

## 2021-03-18 DIAGNOSIS — Z95828 Presence of other vascular implants and grafts: Secondary | ICD-10-CM

## 2021-03-18 DIAGNOSIS — Z79899 Other long term (current) drug therapy: Secondary | ICD-10-CM | POA: Diagnosis not present

## 2021-03-18 DIAGNOSIS — Z5111 Encounter for antineoplastic chemotherapy: Secondary | ICD-10-CM | POA: Insufficient documentation

## 2021-03-18 DIAGNOSIS — Z5112 Encounter for antineoplastic immunotherapy: Secondary | ICD-10-CM | POA: Insufficient documentation

## 2021-03-18 DIAGNOSIS — C20 Malignant neoplasm of rectum: Secondary | ICD-10-CM

## 2021-03-18 MED ORDER — SODIUM CHLORIDE 0.9% FLUSH
10.0000 mL | Freq: Once | INTRAVENOUS | Status: AC
  Administered 2021-03-18: 10 mL

## 2021-03-18 MED ORDER — HEPARIN SOD (PORK) LOCK FLUSH 100 UNIT/ML IV SOLN
500.0000 [IU] | Freq: Once | INTRAVENOUS | Status: AC
  Administered 2021-03-18: 500 [IU]

## 2021-03-18 NOTE — Patient Instructions (Signed)
PICC Insertion, Care After ?The following information offers guidance on how to care for yourself after your procedure. Your health care provider may also give you more specific instructions. If you have problems or questions, contact your health care provider. ?What can I expect after the procedure? ?After the procedure, it is common to have mild discomfort and bruising at your insertion site. ?Follow these instructions at home: ?Bathing ? ?Do not take baths, swim, or use a hot tub until your health care provider approves. Ask your health care provider if you may take showers. You may only be allowed to take sponge baths. ?Keep the bandage (dressing) dry. If it gets wet, it needs to be changed right away to help prevent infection. ?Activity ? ?Rest as told by your health care provider. Ask your health care provider when you can return to your normal activities. ?If your PICC is near or at the bend of your elbow, avoid activity with repeated motion at the elbow. ?Do not lift anything that is heavier than 10 lb (4.5 kg), or the limit that you are told, until your health care provider says that it is safe. ?If you use a crutch, do not use it with the arm on the same side as your PICC. You may need to use a walker instead. ?Avoid any activity that requires great effort as told by your health care provider. ?General instructions ?If you were given a sedative during the procedure, it can affect you for several hours. Do not drive or operate machinery until your health care provider says that it is safe. ?Take over-the-counter and prescription medicines only as told by your health care provider. ?Flush the PICC as told by your health care provider. Let your health care provider know right away if the PICC is hard to flush or does not flush. Do not use force to flush the PICC. ?Do not use any products that contain nicotine or tobacco. These products include cigarettes, chewing tobacco, and vaping devices, such as  e-cigarettes. These can delay healing after surgery. If you need help quitting, ask your health care provider. ?Check your insertion site every day for signs of infection. Check for: ?More redness, swelling, or pain. ?Fluid or blood. ?Warmth. ?Pus or a bad smell. ?Keep all follow-up visits. This is important. You will need to have your PICC dressing changed at least once a week. ?Contact a health care provider if: ?You have pain in your arm, ear, face, or teeth. ?You have a fever or chills. ?You have more redness, swelling, or pain around your insertion site. ?You have fluid or blood coming from your insertion site. ?Your insertion site feels warm to the touch. ?You have pus or a bad smell coming from your insertion site. ?Your vein feels hard and raised like a cord under the skin around your insertion site. ?Your PICC dressing has gotten wet. ?Your PICC dressing is coming off. Tape it on until it can be changed. ?Get help right away if: ?You have problems with the PICC. These include if your PICC: ?Is accidentally pulled all the way out. If this happens, cover the insertion site with a gauze dressing. Do not throw the PICC away. Your health care provider will need to check it to be sure the entire catheter came out. ?Cannot be flushed, is hard to flush, or leaks around the insertion site when flushed. ?Was tugged or pulled and has partially come out. Do not push the PICC back in. ?Makes a flushing sound when  flushed. ?Appears to have a hole or tear. ?You feel your heart racing or skipping beats, or you have chest pain. ?You have swelling, redness, warmth, or pain in the arm or leg where the PICC is placed. ?You have a red streak going up your arm that starts under your PICC dressing. ?You have numbness or tingling in your fingers, hand, or arm. ?These symptoms may be an emergency. Get help right away. Call 911. ?Do not wait to see if the symptoms will go away. ?Do not drive yourself to the hospital. ?Summary ?After  your procedure, it is common to have mild discomfort and bruising at your insertion site. ?Do not lift anything that is heavier than 10 lb (4.5 kg), or the limit that you are told, until your health care provider says that it is safe. ?Flush the PICC as told by your health care provider. Tell your health care provider right away if the PICC is hard to flush or does not flush. Do not use force to flush the PICC. ?Check your insertion site every day for signs of infection. ?This information is not intended to replace advice given to you by your health care provider. Make sure you discuss any questions you have with your health care provider. ?Document Revised: 07/18/2020 Document Reviewed: 07/18/2020 ?Elsevier Patient Education ? [place]. ? ?

## 2021-03-24 ENCOUNTER — Other Ambulatory Visit: Payer: Self-pay | Admitting: Oncology

## 2021-03-25 ENCOUNTER — Inpatient Hospital Stay (HOSPITAL_BASED_OUTPATIENT_CLINIC_OR_DEPARTMENT_OTHER): Payer: Medicaid Other | Admitting: Nurse Practitioner

## 2021-03-25 ENCOUNTER — Other Ambulatory Visit: Payer: Self-pay

## 2021-03-25 ENCOUNTER — Inpatient Hospital Stay: Payer: Medicaid Other

## 2021-03-25 ENCOUNTER — Encounter: Payer: Self-pay | Admitting: Nurse Practitioner

## 2021-03-25 ENCOUNTER — Encounter: Payer: Self-pay | Admitting: *Deleted

## 2021-03-25 VITALS — BP 140/90 | HR 92 | Temp 97.9°F | Resp 18 | Ht 65.0 in | Wt 169.6 lb

## 2021-03-25 VITALS — BP 137/90 | HR 74 | Temp 98.4°F | Resp 18

## 2021-03-25 DIAGNOSIS — C19 Malignant neoplasm of rectosigmoid junction: Secondary | ICD-10-CM | POA: Diagnosis not present

## 2021-03-25 DIAGNOSIS — C20 Malignant neoplasm of rectum: Secondary | ICD-10-CM

## 2021-03-25 DIAGNOSIS — Z5111 Encounter for antineoplastic chemotherapy: Secondary | ICD-10-CM | POA: Diagnosis not present

## 2021-03-25 LAB — CMP (CANCER CENTER ONLY)
ALT: 7 U/L (ref 0–44)
AST: 13 U/L — ABNORMAL LOW (ref 15–41)
Albumin: 4.3 g/dL (ref 3.5–5.0)
Alkaline Phosphatase: 131 U/L — ABNORMAL HIGH (ref 38–126)
Anion gap: 9 (ref 5–15)
BUN: 8 mg/dL (ref 6–20)
CO2: 27 mmol/L (ref 22–32)
Calcium: 9.8 mg/dL (ref 8.9–10.3)
Chloride: 105 mmol/L (ref 98–111)
Creatinine: 0.55 mg/dL (ref 0.44–1.00)
GFR, Estimated: >60 mL/min (ref >60)
Glucose, Bld: 79 mg/dL (ref 70–99)
Potassium: 3.1 mmol/L — ABNORMAL LOW (ref 3.5–5.1)
Sodium: 141 mmol/L (ref 135–145)
Total Bilirubin: 0.4 mg/dL (ref 0.3–1.2)
Total Protein: 7.1 g/dL (ref 6.5–8.1)

## 2021-03-25 LAB — CBC WITH DIFFERENTIAL (CANCER CENTER ONLY)
Abs Immature Granulocytes: 0.01 10*3/uL (ref 0.00–0.07)
Basophils Absolute: 0 10*3/uL (ref 0.0–0.1)
Basophils Relative: 1 %
Eosinophils Absolute: 0.3 10*3/uL (ref 0.0–0.5)
Eosinophils Relative: 5 %
HCT: 38.7 % (ref 36.0–46.0)
Hemoglobin: 12.8 g/dL (ref 12.0–15.0)
Immature Granulocytes: 0 %
Lymphocytes Relative: 41 %
Lymphs Abs: 2.7 10*3/uL (ref 0.7–4.0)
MCH: 30.1 pg (ref 26.0–34.0)
MCHC: 33.1 g/dL (ref 30.0–36.0)
MCV: 91.1 fL (ref 80.0–100.0)
Monocytes Absolute: 0.5 10*3/uL (ref 0.1–1.0)
Monocytes Relative: 8 %
Neutro Abs: 3 10*3/uL (ref 1.7–7.7)
Neutrophils Relative %: 45 %
Platelet Count: 272 10*3/uL (ref 150–400)
RBC: 4.25 MIL/uL (ref 3.87–5.11)
RDW: 15.4 % (ref 11.5–15.5)
WBC Count: 6.6 10*3/uL (ref 4.0–10.5)
nRBC: 0 % (ref 0.0–0.2)

## 2021-03-25 LAB — MAGNESIUM: Magnesium: 1.8 mg/dL (ref 1.7–2.4)

## 2021-03-25 LAB — CEA (ACCESS): CEA (CHCC): 1.85 ng/mL (ref 0.00–5.00)

## 2021-03-25 MED ORDER — SODIUM CHLORIDE 0.9 % IV SOLN
1800.0000 mg/m2 | INTRAVENOUS | Status: DC
  Administered 2021-03-25: 3350 mg via INTRAVENOUS
  Filled 2021-03-25: qty 67

## 2021-03-25 MED ORDER — SODIUM CHLORIDE 0.9 % IV SOLN
200.0000 mg/m2 | Freq: Once | INTRAVENOUS | Status: AC
  Administered 2021-03-25: 372 mg via INTRAVENOUS
  Filled 2021-03-25: qty 18.6

## 2021-03-25 MED ORDER — SODIUM CHLORIDE 0.9 % IV SOLN
125.0000 mg/m2 | Freq: Once | INTRAVENOUS | Status: AC
  Administered 2021-03-25: 240 mg via INTRAVENOUS
  Filled 2021-03-25: qty 10

## 2021-03-25 MED ORDER — SODIUM CHLORIDE 0.9 % IV SOLN
150.0000 mg | Freq: Once | INTRAVENOUS | Status: AC
  Administered 2021-03-25: 150 mg via INTRAVENOUS
  Filled 2021-03-25: qty 5

## 2021-03-25 MED ORDER — SODIUM CHLORIDE 0.9 % IV SOLN
10.0000 mg | Freq: Once | INTRAVENOUS | Status: AC
  Administered 2021-03-25: 10 mg via INTRAVENOUS
  Filled 2021-03-25: qty 1

## 2021-03-25 MED ORDER — SODIUM CHLORIDE 0.9 % IV SOLN: Freq: Once | INTRAVENOUS | Status: AC

## 2021-03-25 MED ORDER — FLUOROURACIL CHEMO INJECTION 500 MG/10ML
200.0000 mg/m2 | Freq: Once | INTRAVENOUS | Status: AC
  Administered 2021-03-25: 350 mg via INTRAVENOUS
  Filled 2021-03-25: qty 7

## 2021-03-25 MED ORDER — POTASSIUM CHLORIDE CRYS ER 20 MEQ PO TBCR: 20.0000 meq | EXTENDED_RELEASE_TABLET | Freq: Three times a day (TID) | ORAL | 0 refills | Status: DC

## 2021-03-25 MED ORDER — ATROPINE SULFATE 1 MG/ML IV SOLN
0.5000 mg | Freq: Once | INTRAVENOUS | Status: AC
  Administered 2021-03-25: 0.5 mg via INTRAVENOUS
  Filled 2021-03-25: qty 1

## 2021-03-25 MED ORDER — PALONOSETRON HCL INJECTION 0.25 MG/5ML
0.2500 mg | Freq: Once | INTRAVENOUS | Status: AC
  Administered 2021-03-25: 0.25 mg via INTRAVENOUS
  Filled 2021-03-25: qty 5

## 2021-03-25 MED ORDER — SODIUM CHLORIDE 0.9 % IV SOLN
6.0000 mg/kg | Freq: Once | INTRAVENOUS | Status: AC
  Administered 2021-03-25: 440 mg via INTRAVENOUS
  Filled 2021-03-25: qty 20

## 2021-03-25 NOTE — Progress Notes (Signed)
Patient seen by Ned Card NP today ? ?Vitals are within treatment parameters. ? ?Labs reviewed by Ned Card NP and are not all within treatment parameters. KCL low at 3.1--OK to treat. Have increased oral KCL to tid. ? ?Per physician team, patient is ready for treatment and there are NO modifications to the treatment plan.  ?

## 2021-03-25 NOTE — Progress Notes (Signed)
Patient's picc line assess during blood draw.  Dressing intact, but appears to have been reinforced.  Drainage is apparent underneath the dressing.  Biopatch appears to be saturated with fluid.  Ned Card, NP notified to assess PICC site.  ?

## 2021-03-25 NOTE — Progress Notes (Signed)
Patient presents for treatment. RN assessment completed along with the following: ? ?Labs/vitals reviewed - yes, and ok to treat with K 3.1. Pt to increase PO K.  ?Weight within 10% of previous measurement - Yes ?Informed consent completed and reflects current therapy/intent - Yes, on date 08/19/20             ?Provider progress note reviewed - Today's provider note is not yet available. I reviewed the most recent oncology provider progress note in chart dated 03/04/21. ?Treatment/Antibody/Supportive plan reviewed - Yes, and there are no adjustments needed for today's treatment. ?S&H and other orders reviewed - Yes, and there are no additional orders identified. ?Previous treatment date reviewed - Yes, and the appropriate amount of time has elapsed between treatments. ?Clinic Hand Off Received from - Merceda Elks, RN ? ?Patient to proceed with treatment.   ?

## 2021-03-25 NOTE — Progress Notes (Signed)
?[place] ?OFFICE PROGRESS NOTE ? ? ?Diagnosis: Colon cancer ? ?INTERVAL HISTORY:  ? ?April [name] returns as scheduled.  She completed cycle 10 FOLFIRI/Panitumumab 03/04/2021.  She reports she is tolerating treatment well.  No nausea or vomiting.  No mouth sores.  No diarrhea.  She notes intermittent tinnitus over the past 3 weeks.  No hearing loss. ? ?Objective: ? ?Vital signs in last 24 hours: ? ?Blood pressure 140/90, pulse 92, temperature 97.9 ?F (36.6 ?C), temperature source Oral, resp. rate 18, height _0  (1.651 m), weight 169 lb 9.6 oz (76.9 kg), last menstrual period 01/07/2006, SpO2 98 %. ?  ? ?HEENT: No thrush or ulcers. ?Resp: Lungs clear bilaterally. ?Cardio: Regular rate and rhythm. ?GI: Abdomen soft and nontender.  No hepatosplenomegaly. ?Vascular: No leg edema. ?Skin: No significant rash. ?PICC site is without erythema.  Skin underneath the dressing appears hyperpigmented. ? ?Lab Results: ? ?Lab Results  ?Component Value Date  ? WBC 6.6 03/25/2021  ? HGB 12.8 03/25/2021  ? HCT 38.7 03/25/2021  ? MCV 91.1 03/25/2021  ? PLT 272 03/25/2021  ? NEUTROABS 3.0 03/25/2021  ? ? ?Imaging: ? ?No results found. ? ?Medications: I have reviewed the patient's current medications. ? ?Assessment/Plan: ?Sigmoid colon cancer, stage IV (pT4a,pN2b,M1c) ?Colonoscopy 03/24/2019-3 rectal polyps-hyperplastic polyps, distal colon biopsy-at least intramucosal adenocarcinoma, completely obstructing mass in the distal sigmoid colon, could not be traversed, intact mismatch repair protein expression ?03/24/2019-CEA 56.1 ?03/29/2019 CT abdomen/pelvis-circumferential thickening involving the entire mid and distal sigmoid colon to the level of the rectosigmoid junction, solid/cystic mass in the left ovary, bilateral nephrolithiasis ?Robotic assisted low anterior resection, mesenteric lymphadenectomy, bilateral salpingo-oophorectomy 04/08/2019 ?Pathology ([facility] review of outside pathology) metastatic adenocarcinoma  involving the peritoneum overlying the round ligament, serosa of the urinary bladder, serosa of the right ureter a sacral area, pelvic peritoneum, and left ovary.  Omental biopsy with focal mucin pools with no carcinoma cells identified, right hemidiaphragm biopsy involved by metastatic adenocarcinoma, invasive adenocarcinoma the sigmoid colon, moderately differentiated, T4a, perineural and vascular invasion present, 7/22 lymph nodes, multiple tumor deposits, resection margins negative ?Negative for PD-L1, low probability of MSI-high, HER-2 negative, negative for BRAF, NRAS and KRAS alterations ?CTs 05/19/2019-no evidence of metastatic disease, findings suspicious for colitis of the transverse and ascending colon, small amount of ascites in the cul-de-sac, bilateral renal calculi ?Cycle 1 FOLFIRI 05/24/2019, bevacizumab added with cycle 2 ?Cycle 4 FOLFIRI/bevacizumab 07/05/2019 ?Cycle 5 FOLFIRI 07/27/2019, bevacizumab held secondary to hypertension ?Cycle 6 FOLFIRI 08/10/1999, bevacizumab held secondary to hypertension ?CTs 08/30/2019-no evidence of recurrent disease, new subsolid right lower lobe nodule felt to be inflammatory, emphysema ?Cycle 7 FOLFIRI 09/26/2019, bevacizumab remains on hold secondary to hypertension ?Cycle 8 FOLFIRI 10/17/2019, bevacizumab held secondary to hypertension, Udenyca added for neutropenia ?Cycle 9 FOLFIRI 11/07/2019, bevacizumab held secondary to hypertension, Udenyca ?Cycle 10 FOLFIRI 11/28/2019, bevacizumab held, [place] ?Cycle 11 FOLFIRI 12/26/2019, bevacizumab held, [place] ?CTs 01/25/2020-no evidence of recurrent disease, resolution of right lower lobe nodule ?Maintenance Xeloda beginning 02/06/2020 ?CTs 04/25/2020- no evidence of metastatic disease, multiple bilateral renal calculi without hydronephrosis ?Maintenance Xeloda continued ?CT abdomen/pelvis without contrast 08/10/2020-bilateral staghorn renal calculi, no evidence of metastatic disease; addendum 08/23/2020-small but increasing  omental nodules. ?Cycle 1 FOLFIRI/panitumumab 09/19/2020 ?Cycle 2 FOLFIRI/Panitumumab 10/03/2020 ?Cycle 3 FOLFIRI/Panitumumab 10/17/2020 ?Cycle 4 FOLFIRI/panitumumab 10/31/2020 ?Cycle 5 FOLFIRI/Panitumumab 11/14/2020 ?CTs 11/26/2020-stable omental metastases compared to 10/22/2020, mildly decreased from 08/10/2020.  Left lower quadrant tiny paracolic gutter implant slightly decreased from CT 08/10/2020.  No new or progressive metastatic  disease in the abdomen or pelvis. ?Cycle 6 FOLFIRI/Panitumumab 11/28/2020, irinotecan dose reduced, treatment schedule adjusted to every 3 weeks going forward ?Cycle 7 FOLFIRI/panitumumab 12/24/2020 ?Cycle 8 FOLFIRI/Panitumumab 01/16/2021--treatment held due to hypertension in the infusion area. ?Cycle 8 FOLFIRI/panitumumab 01/21/2021 ?Cycle 9 FOLFIRI/panitumumab 02/11/2021 ?Cycle 10 FOLFIRI/Panitumumab 03/04/2021 ?Cycle 11 FOLFIRI/Panitumumab 03/25/2021 ?  ?Hypertension ?G4 P3, twins ?Kidney stones-bilateral staghorn renal calculi on CT 08/10/2020 ?Infected Port-A-Cath 09/12/2019-placed on Augmentin, referred for Port-A-Cath removal; Port-A-Cath removed 09/14/2019; PICC line placed 09/14/2019; culture staph aureus.  Course of Septra completed. ?Neutropenia secondary to chemotherapy-Udenyca added with cycle 8 FOLFIRI ?Right nephrostomy tube 09/12/2020; stent placed 10/09/2020, percutaneous nephrostolithotomy treatment of right-sided kidney stones 10/09/2020, malpositioned right ureter stent replacement 10/23/2020, right ureter stent removed 10/29/2020 ?  ? ?Disposition: April [name] appears stable.  She has completed 10 cycles of FOLFIRI/Panitumumab.  She is tolerating treatment well.  Plan to proceed with cycle 11 today as scheduled.  Restaging CTs prior to next office visit. ? ?CBC and chemistry panel reviewed.  Labs adequate to proceed with treatment.  She has persistent hypokalemia.  K. Dur increased to 20 mEq 3 times daily. ? ?Etiology of intermittent tinnitus unclear.  I spoke with the [place] pharmacist.  Not clearly related with any drugs on the current treatment regimen. ? ?She will return for follow-up in 3 weeks, CTs a few days prior. ? ? ? ?Ned Card ANP/GNP-BC  ? ?03/25/2021  ?9:15 AM ? ? ? ? ? ? ? ?

## 2021-03-25 NOTE — Patient Instructions (Signed)
[place]  Discharge Instructions: ?Thank you for choosing [facility] to provide your oncology and hematology care.  ? ?If you have a lab appointment with the [facility], please go directly to the [facility] and check in at the registration area. ?  ?Wear comfortable clothing and clothing appropriate for easy access to any Portacath or PICC line.  ? ?We strive to give you quality time with your provider. You may need to reschedule your appointment if you arrive late (15 or more minutes).  Arriving late affects you and other patients whose appointments are after yours.  Also, if you miss three or more appointments without notifying the office, you may be dismissed from the clinic at the provider?s discretion.    ?  ?For prescription refill requests, have your pharmacy contact our office and allow 72 hours for refills to be completed.   ? ?Today you received the following chemotherapy and/or immunotherapy agents Vectibix, Fofiri    ?  ?To help prevent nausea and vomiting after your treatment, we encourage you to take your nausea medication as directed. ? ?BELOW ARE SYMPTOMS THAT SHOULD BE REPORTED IMMEDIATELY: ?*FEVER GREATER THAN 100.4 F (38 ?C) OR HIGHER ?*CHILLS OR SWEATING ?*NAUSEA AND VOMITING THAT IS NOT CONTROLLED WITH YOUR NAUSEA MEDICATION ?*UNUSUAL SHORTNESS OF BREATH ?*UNUSUAL BRUISING OR BLEEDING ?*URINARY PROBLEMS (pain or burning when urinating, or frequent urination) ?*BOWEL PROBLEMS (unusual diarrhea, constipation, pain near the anus) ?TENDERNESS IN MOUTH AND THROAT WITH OR WITHOUT PRESENCE OF ULCERS (sore throat, sores in mouth, or a toothache) ?UNUSUAL RASH, SWELLING OR PAIN  ?UNUSUAL VAGINAL DISCHARGE OR ITCHING  ? ?Items with * indicate a potential emergency and should be followed up as soon as possible or go to the Emergency Department if any problems should occur. ? ?Please show the CHEMOTHERAPY ALERT CARD or IMMUNOTHERAPY ALERT CARD at check-in  to the Emergency Department and triage nurse. ? ?Should you have questions after your visit or need to cancel or reschedule your appointment, please contact [facility]  Dept: [phone]  and follow the prompts.  Office hours are 8:00 a.m. to 4:30 p.m. Monday - Friday. Please note that voicemails left after 4:00 p.m. may not be returned until the following business day.  We are closed weekends and major holidays. You have access to a nurse at all times for urgent questions. Please call the main number to the clinic Dept: [phone] and follow the prompts. ? ? ?For any non-urgent questions, you may also contact your provider using MyChart. We now offer e-Visits for anyone 14 and older to request care online for non-urgent symptoms. For details visit [email or web address]. ?  ?Also download the MyChart app! Go to the app store, search "MyChart", open the app, select [facility], and log in with your MyChart username and password. ? ?Due to Covid, a mask is required upon entering the hospital/clinic. If you do not have a mask, one will be given to you upon arrival. For doctor visits, patients may have 1 support person aged 65 or older with them. For treatment visits, patients cannot have anyone with them due to current Covid guidelines and our immunocompromised population.  ? ?

## 2021-03-25 NOTE — Patient Instructions (Signed)
PICC Home Care Guide A peripherally inserted central catheter (PICC) is a form of IV access that allows medicines and IV fluids to be quickly put into the blood and spread throughout the body. The PICC is a long, thin, flexible tube (catheter) that is put into a vein in a person's arm or leg. The catheter ends in a large vein just outside the heart called the superior vena cava (SVC). After the PICC is put in, a chest X-ray may be done to make sure that it is in the right place. A PICC may be placed for different reasons, such as: To give medicines and liquid nutrition. To give IV fluids and blood products. To take blood samples often. If there is trouble placing a peripheral intravenous (PIV) catheter. If cared for properly, a PICC can remain in place for many months. Having a PICC can allow you to go home from the hospital sooner and continue treatment at home. Medicines and PICC care can be managed at home by a family member, caregiver, or home health care team. What are the risks? Generally, having a PICC is safe. However, problems may occur, including: A blood clot (thrombus) forming in or at the end of the PICC. A blood clot forming in a vein (deep vein thrombosis) or traveling to the lung (pulmonary embolism). Inflammation of the vein (phlebitis) in which the PICC is placed. Infection at the insertion site or in the blood. Blood infections from central lines, like PICCs, can be serious and often require a hospital stay. PICC malposition, or PICC movement or poor placement. A break or cut in the PICC. Do not use scissors near the PICC. Nerve or tendon irritation or injury during PICC insertion. How to care for your PICC Please follow the specific guidelines provided by your health care provider. Preventing infection You and any caregivers should wash your hands often with soap and water for at least 20 seconds. Wash hands: Before touching the PICC or the infusion device. Before changing a  bandage (dressing). Do not change the dressing unless you have been taught to do so and have shown you are able to change it safely. Flush the PICC as told. Tell your health care provider right away if the PICC is hard to flush or does not flush. Do not use force to flush the PICC. Use clean and germ-free (sterile) supplies only. Keep the supplies in a dry place. Do not reuse needles, syringes, or any other supplies. Reusing supplies can lead to infection. Keep the PICC dressing dry and secure it with tape if the edges stop sticking to your skin. Check your PICC insertion site every day for signs of infection. Check for: Redness, swelling, or pain. Fluid or blood. Warmth. Pus or a bad smell. Preventing other problems Do not use a syringe that is less than 10 mL to flush the PICC. Do not have your blood pressure checked on the arm in which the PICC is placed. Do not ever pull or tug on the PICC. Keep it secured to your arm with tape or a stretch wrap when not in use. Do not take the PICC out yourself. Only a trained health care provider should remove the PICC. Keep pets and children away from your PICC. How to care for your PICC dressing Keep your PICC dressing clean and dry to prevent infection. Do not take baths, swim, or use a hot tub until your health care provider approves. Ask your health care provider if you can take   showers. You may only be allowed to take sponge baths. When you are allowed to shower: Ask your health care provider to teach you how to wrap the PICC. Cover the PICC with clear plastic wrap and tape to keep it dry while showering. Follow instructions from your health care provider about how to take care of your insertion site and dressing. Make sure you: Wash your hands with soap and water for at least 20 seconds before and after you change your dressing. If soap and water are not available, use hand sanitizer. Change your dressing only if taught to do so by your health care  provider. Your PICC dressing needs to be changed if it becomes loose or wet. Leave stitches (sutures), skin glue, or adhesive strips in place. These skin closures may need to stay in place for 2 weeks or longer. If adhesive strip edges start to loosen and curl up, you may trim the loose edges. Do not remove adhesive strips completely unless your health care provider tells you to do that. Follow these instructions at home: Disposal of supplies Throw away any syringes in a disposal container that is meant for sharp items (sharps container). You can buy a sharps container from a pharmacy, or you can make one by using an empty, hard plastic bottle with a lid. Place any used dressings or infusion bags into a plastic bag. Throw that bag in the trash. General instructions  Always carry your PICC identification card or wear a medical alert bracelet. Keep the tube clamped at all times, unless it is being used. Always carry a smooth-edge clamp with you to clamp the PICC if it breaks. Do not use scissors or sharp objects near the tube. You may bend your arm and move it freely. If your PICC is near or at the bend of your elbow, avoid activity with repeated motion at the elbow. Avoid lifting heavy objects as told by your health care provider. Keep all follow-up visits. This is important. You will need to have your PICC dressing changed at least once a week. Contact a health care provider if: You have pain in your arm, ear, face, or teeth. You have a fever or chills. You have redness, swelling, or pain around the insertion site. You have fluid or blood coming from the insertion site. Your insertion site feels warm to the touch. You have pus or a bad smell coming from the insertion site. Your skin feels hard and raised around the insertion site. Your PICC dressing has gotten wet or is coming off and you have not been taught how to change it. Get help right away if: You have problems with your PICC, such as  your PICC: Was tugged or pulled and has partially come out. Do not  push the PICC back in. Cannot be flushed, is hard to flush, or leaks around the insertion site when it is flushed. Makes a flushing sound when it is flushed. Appears to have a hole or tear. Is accidentally pulled all the way out. If this happens, cover the insertion site with a gauze dressing. Do not throw the PICC away. Your health care provider will need to check it to be sure the entire catheter came out. You feel your heart racing or skipping beats, or you have chest pain. You have shortness of breath or trouble breathing. You have swelling, redness, warmth, or pain in the arm in which the PICC is placed. You have a red streak going up your arm that   starts under the PICC dressing. These symptoms may be an emergency. Get help right away. Call 911. Do not wait to see if the symptoms will go away. Do not drive yourself to the hospital. Summary A peripherally inserted central catheter (PICC) is a long, thin, flexible tube (catheter) that is put into a vein in the arm or leg. If cared for properly, a PICC can remain in place for many months. Having a PICC can allow you to go home from the hospital sooner and continue treatment at home. The PICC is inserted using a germ-free (sterile) technique by a specially trained health care provider. Only a trained health care provider should remove it. Do not have your blood pressure checked on the arm in which your PICC is placed. Always keep your PICC identification card with you. This information is not intended to replace advice given to you by your health care provider. Make sure you discuss any questions you have with your health care provider. Document Revised: 07/18/2020 Document Reviewed: 07/18/2020 Elsevier Patient Education  2022 [employer]  

## 2021-03-27 ENCOUNTER — Other Ambulatory Visit: Payer: Self-pay

## 2021-03-27 ENCOUNTER — Inpatient Hospital Stay: Payer: Medicaid Other

## 2021-03-27 VITALS — BP 122/84 | HR 78 | Temp 98.2°F | Resp 18

## 2021-03-27 DIAGNOSIS — C20 Malignant neoplasm of rectum: Secondary | ICD-10-CM

## 2021-03-27 DIAGNOSIS — Z5111 Encounter for antineoplastic chemotherapy: Secondary | ICD-10-CM | POA: Diagnosis not present

## 2021-03-27 MED ORDER — HEPARIN SOD (PORK) LOCK FLUSH 100 UNIT/ML IV SOLN
250.0000 [IU] | Freq: Once | INTRAVENOUS | Status: AC | PRN
  Administered 2021-03-27: 250 [IU]

## 2021-03-27 MED ORDER — SODIUM CHLORIDE 0.9% FLUSH
10.0000 mL | INTRAVENOUS | Status: DC | PRN
  Administered 2021-03-27: 10 mL

## 2021-04-01 ENCOUNTER — Other Ambulatory Visit: Payer: Self-pay

## 2021-04-01 ENCOUNTER — Other Ambulatory Visit: Payer: Self-pay | Admitting: Nurse Practitioner

## 2021-04-01 ENCOUNTER — Inpatient Hospital Stay: Payer: Medicaid Other

## 2021-04-01 VITALS — BP 122/98 | HR 101 | Temp 99.2°F | Resp 18

## 2021-04-01 DIAGNOSIS — Z95828 Presence of other vascular implants and grafts: Secondary | ICD-10-CM

## 2021-04-01 DIAGNOSIS — C19 Malignant neoplasm of rectosigmoid junction: Secondary | ICD-10-CM

## 2021-04-01 DIAGNOSIS — C20 Malignant neoplasm of rectum: Secondary | ICD-10-CM

## 2021-04-01 DIAGNOSIS — Z5111 Encounter for antineoplastic chemotherapy: Secondary | ICD-10-CM | POA: Diagnosis not present

## 2021-04-01 MED ORDER — SODIUM CHLORIDE 0.9% FLUSH
10.0000 mL | Freq: Once | INTRAVENOUS | Status: AC
  Administered 2021-04-01: 10 mL

## 2021-04-01 MED ORDER — HEPARIN SOD (PORK) LOCK FLUSH 100 UNIT/ML IV SOLN
500.0000 [IU] | Freq: Once | INTRAVENOUS | Status: AC
  Administered 2021-04-01: 500 [IU]

## 2021-04-01 NOTE — Patient Instructions (Signed)
PICC Home Care Guide A peripherally inserted central catheter (PICC) is a form of IV access that allows medicines and IV fluids to be quickly put into the blood and spread throughout the body. The PICC is a long, thin, flexible tube (catheter) that is put into a vein in a person's arm or leg. The catheter ends in a large vein just outside the heart called the superior vena cava (SVC). After the PICC is put in, a chest X-ray may be done to make sure that it is in the right place. A PICC may be placed for different reasons, such as: To give medicines and liquid nutrition. To give IV fluids and blood products. To take blood samples often. If there is trouble placing a peripheral intravenous (PIV) catheter. If cared for properly, a PICC can remain in place for many months. Having a PICC can allow you to go home from the hospital sooner and continue treatment at home. Medicines and PICC care can be managed at home by a family member, caregiver, or home health care team. What are the risks? Generally, having a PICC is safe. However, problems may occur, including: A blood clot (thrombus) forming in or at the end of the PICC. A blood clot forming in a vein (deep vein thrombosis) or traveling to the lung (pulmonary embolism). Inflammation of the vein (phlebitis) in which the PICC is placed. Infection at the insertion site or in the blood. Blood infections from central lines, like PICCs, can be serious and often require a hospital stay. PICC malposition, or PICC movement or poor placement. A break or cut in the PICC. Do not use scissors near the PICC. Nerve or tendon irritation or injury during PICC insertion. How to care for your PICC Please follow the specific guidelines provided by your health care provider. Preventing infection You and any caregivers should wash your hands often with soap and water for at least 20 seconds. Wash hands: Before touching the PICC or the infusion device. Before changing a  bandage (dressing). Do not change the dressing unless you have been taught to do so and have shown you are able to change it safely. Flush the PICC as told. Tell your health care provider right away if the PICC is hard to flush or does not flush. Do not use force to flush the PICC. Use clean and germ-free (sterile) supplies only. Keep the supplies in a dry place. Do not reuse needles, syringes, or any other supplies. Reusing supplies can lead to infection. Keep the PICC dressing dry and secure it with tape if the edges stop sticking to your skin. Check your PICC insertion site every day for signs of infection. Check for: Redness, swelling, or pain. Fluid or blood. Warmth. Pus or a bad smell. Preventing other problems Do not use a syringe that is less than 10 mL to flush the PICC. Do not have your blood pressure checked on the arm in which the PICC is placed. Do not ever pull or tug on the PICC. Keep it secured to your arm with tape or a stretch wrap when not in use. Do not take the PICC out yourself. Only a trained health care provider should remove the PICC. Keep pets and children away from your PICC. How to care for your PICC dressing Keep your PICC dressing clean and dry to prevent infection. Do not take baths, swim, or use a hot tub until your health care provider approves. Ask your health care provider if you can take   showers. You may only be allowed to take sponge baths. When you are allowed to shower: Ask your health care provider to teach you how to wrap the PICC. Cover the PICC with clear plastic wrap and tape to keep it dry while showering. Follow instructions from your health care provider about how to take care of your insertion site and dressing. Make sure you: Wash your hands with soap and water for at least 20 seconds before and after you change your dressing. If soap and water are not available, use hand sanitizer. Change your dressing only if taught to do so by your health care  provider. Your PICC dressing needs to be changed if it becomes loose or wet. Leave stitches (sutures), skin glue, or adhesive strips in place. These skin closures may need to stay in place for 2 weeks or longer. If adhesive strip edges start to loosen and curl up, you may trim the loose edges. Do not remove adhesive strips completely unless your health care provider tells you to do that. Follow these instructions at home: Disposal of supplies Throw away any syringes in a disposal container that is meant for sharp items (sharps container). You can buy a sharps container from a pharmacy, or you can make one by using an empty, hard plastic bottle with a lid. Place any used dressings or infusion bags into a plastic bag. Throw that bag in the trash. General instructions  Always carry your PICC identification card or wear a medical alert bracelet. Keep the tube clamped at all times, unless it is being used. Always carry a smooth-edge clamp with you to clamp the PICC if it breaks. Do not use scissors or sharp objects near the tube. You may bend your arm and move it freely. If your PICC is near or at the bend of your elbow, avoid activity with repeated motion at the elbow. Avoid lifting heavy objects as told by your health care provider. Keep all follow-up visits. This is important. You will need to have your PICC dressing changed at least once a week. Contact a health care provider if: You have pain in your arm, ear, face, or teeth. You have a fever or chills. You have redness, swelling, or pain around the insertion site. You have fluid or blood coming from the insertion site. Your insertion site feels warm to the touch. You have pus or a bad smell coming from the insertion site. Your skin feels hard and raised around the insertion site. Your PICC dressing has gotten wet or is coming off and you have not been taught how to change it. Get help right away if: You have problems with your PICC, such as  your PICC: Was tugged or pulled and has partially come out. Do not  push the PICC back in. Cannot be flushed, is hard to flush, or leaks around the insertion site when it is flushed. Makes a flushing sound when it is flushed. Appears to have a hole or tear. Is accidentally pulled all the way out. If this happens, cover the insertion site with a gauze dressing. Do not throw the PICC away. Your health care provider will need to check it to be sure the entire catheter came out. You feel your heart racing or skipping beats, or you have chest pain. You have shortness of breath or trouble breathing. You have swelling, redness, warmth, or pain in the arm in which the PICC is placed. You have a red streak going up your arm that   starts under the PICC dressing. These symptoms may be an emergency. Get help right away. Call 911. Do not wait to see if the symptoms will go away. Do not drive yourself to the hospital. Summary A peripherally inserted central catheter (PICC) is a long, thin, flexible tube (catheter) that is put into a vein in the arm or leg. If cared for properly, a PICC can remain in place for many months. Having a PICC can allow you to go home from the hospital sooner and continue treatment at home. The PICC is inserted using a germ-free (sterile) technique by a specially trained health care provider. Only a trained health care provider should remove it. Do not have your blood pressure checked on the arm in which your PICC is placed. Always keep your PICC identification card with you. This information is not intended to replace advice given to you by your health care provider. Make sure you discuss any questions you have with your health care provider. Document Revised: 07/18/2020 Document Reviewed: 07/18/2020 Elsevier Patient Education  2022 [employer]  

## 2021-04-08 ENCOUNTER — Inpatient Hospital Stay: Payer: Medicaid Other

## 2021-04-08 ENCOUNTER — Ambulatory Visit (HOSPITAL_BASED_OUTPATIENT_CLINIC_OR_DEPARTMENT_OTHER): Payer: Medicaid Other

## 2021-04-09 ENCOUNTER — Inpatient Hospital Stay: Payer: Medicaid Other

## 2021-04-09 ENCOUNTER — Other Ambulatory Visit: Payer: Self-pay

## 2021-04-09 VITALS — BP 144/98 | HR 116 | Temp 98.8°F | Resp 18

## 2021-04-09 DIAGNOSIS — Z95828 Presence of other vascular implants and grafts: Secondary | ICD-10-CM

## 2021-04-09 DIAGNOSIS — C20 Malignant neoplasm of rectum: Secondary | ICD-10-CM

## 2021-04-09 DIAGNOSIS — Z5111 Encounter for antineoplastic chemotherapy: Secondary | ICD-10-CM | POA: Diagnosis not present

## 2021-04-09 MED ORDER — SODIUM CHLORIDE 0.9% FLUSH
10.0000 mL | Freq: Once | INTRAVENOUS | Status: AC
  Administered 2021-04-09: 10 mL

## 2021-04-09 MED ORDER — HEPARIN SOD (PORK) LOCK FLUSH 100 UNIT/ML IV SOLN
500.0000 [IU] | Freq: Once | INTRAVENOUS | Status: AC
  Administered 2021-04-09: 500 [IU]

## 2021-04-10 ENCOUNTER — Encounter (HOSPITAL_BASED_OUTPATIENT_CLINIC_OR_DEPARTMENT_OTHER): Payer: Self-pay

## 2021-04-10 ENCOUNTER — Ambulatory Visit (HOSPITAL_BASED_OUTPATIENT_CLINIC_OR_DEPARTMENT_OTHER)
Admission: RE | Admit: 2021-04-10 | Discharge: 2021-04-10 | Disposition: A | Discharge status: Home / Self Care | Payer: Medicaid Other | Source: Ambulatory Visit | Attending: Nurse Practitioner | Admitting: Nurse Practitioner

## 2021-04-10 DIAGNOSIS — C19 Malignant neoplasm of rectosigmoid junction: Secondary | ICD-10-CM | POA: Insufficient documentation

## 2021-04-10 MED ORDER — IOHEXOL 300 MG/ML  SOLN
100.0000 mL | Freq: Once | INTRAMUSCULAR | Status: AC | PRN
Start: 2021-04-10 — End: 2021-04-10
  Administered 2021-04-10: 85 mL via INTRAVENOUS

## 2021-04-13 ENCOUNTER — Other Ambulatory Visit: Payer: Self-pay | Admitting: Oncology

## 2021-04-15 ENCOUNTER — Other Ambulatory Visit: Payer: Self-pay

## 2021-04-15 ENCOUNTER — Other Ambulatory Visit: Payer: Self-pay | Admitting: *Deleted

## 2021-04-15 ENCOUNTER — Encounter: Payer: Self-pay | Admitting: *Deleted

## 2021-04-15 ENCOUNTER — Telehealth: Payer: Self-pay

## 2021-04-15 ENCOUNTER — Inpatient Hospital Stay (HOSPITAL_BASED_OUTPATIENT_CLINIC_OR_DEPARTMENT_OTHER): Payer: Medicaid Other | Admitting: Oncology

## 2021-04-15 ENCOUNTER — Inpatient Hospital Stay: Payer: Medicaid Other | Attending: Oncology

## 2021-04-15 ENCOUNTER — Inpatient Hospital Stay: Payer: Medicaid Other

## 2021-04-15 ENCOUNTER — Encounter: Payer: Self-pay | Admitting: Licensed Clinical Social Worker

## 2021-04-15 VITALS — BP 145/80 | HR 84 | Temp 97.8°F | Resp 18 | Ht 65.0 in | Wt 165.0 lb

## 2021-04-15 DIAGNOSIS — C7989 Secondary malignant neoplasm of other specified sites: Secondary | ICD-10-CM | POA: Diagnosis not present

## 2021-04-15 DIAGNOSIS — C7962 Secondary malignant neoplasm of left ovary: Secondary | ICD-10-CM | POA: Insufficient documentation

## 2021-04-15 DIAGNOSIS — C786 Secondary malignant neoplasm of retroperitoneum and peritoneum: Secondary | ICD-10-CM | POA: Insufficient documentation

## 2021-04-15 DIAGNOSIS — C20 Malignant neoplasm of rectum: Secondary | ICD-10-CM

## 2021-04-15 DIAGNOSIS — Z5112 Encounter for antineoplastic immunotherapy: Secondary | ICD-10-CM | POA: Insufficient documentation

## 2021-04-15 DIAGNOSIS — E876 Hypokalemia: Secondary | ICD-10-CM

## 2021-04-15 DIAGNOSIS — Z5111 Encounter for antineoplastic chemotherapy: Secondary | ICD-10-CM | POA: Insufficient documentation

## 2021-04-15 DIAGNOSIS — Z79899 Other long term (current) drug therapy: Secondary | ICD-10-CM | POA: Diagnosis not present

## 2021-04-15 DIAGNOSIS — C19 Malignant neoplasm of rectosigmoid junction: Secondary | ICD-10-CM

## 2021-04-15 DIAGNOSIS — C187 Malignant neoplasm of sigmoid colon: Secondary | ICD-10-CM | POA: Insufficient documentation

## 2021-04-15 LAB — CBC WITH DIFFERENTIAL (CANCER CENTER ONLY)
Abs Immature Granulocytes: 0 10*3/uL (ref 0.00–0.07)
Basophils Absolute: 0 10*3/uL (ref 0.0–0.1)
Basophils Relative: 1 %
Eosinophils Absolute: 0.2 10*3/uL (ref 0.0–0.5)
Eosinophils Relative: 3 %
HCT: 40.4 % (ref 36.0–46.0)
Hemoglobin: 13.7 g/dL (ref 12.0–15.0)
Immature Granulocytes: 0 %
Lymphocytes Relative: 38 %
Lymphs Abs: 2.1 10*3/uL (ref 0.7–4.0)
MCH: 31.3 pg (ref 26.0–34.0)
MCHC: 33.9 g/dL (ref 30.0–36.0)
MCV: 92.2 fL (ref 80.0–100.0)
Monocytes Absolute: 0.4 10*3/uL (ref 0.1–1.0)
Monocytes Relative: 8 %
Neutro Abs: 2.7 10*3/uL (ref 1.7–7.7)
Neutrophils Relative %: 50 %
Platelet Count: 292 10*3/uL (ref 150–400)
RBC: 4.38 MIL/uL (ref 3.87–5.11)
RDW: 15.8 % — ABNORMAL HIGH (ref 11.5–15.5)
WBC Count: 5.4 10*3/uL (ref 4.0–10.5)
nRBC: 0 % (ref 0.0–0.2)

## 2021-04-15 LAB — CMP (CANCER CENTER ONLY)
ALT: 11 U/L (ref 0–44)
AST: 14 U/L — ABNORMAL LOW (ref 15–41)
Albumin: 4.6 g/dL (ref 3.5–5.0)
Alkaline Phosphatase: 143 U/L — ABNORMAL HIGH (ref 38–126)
Anion gap: 12 (ref 5–15)
BUN: 6 mg/dL (ref 6–20)
CO2: 27 mmol/L (ref 22–32)
Calcium: 10.1 mg/dL (ref 8.9–10.3)
Chloride: 102 mmol/L (ref 98–111)
Creatinine: 0.68 mg/dL (ref 0.44–1.00)
GFR, Estimated: >60 mL/min (ref >60)
Glucose, Bld: 147 mg/dL — ABNORMAL HIGH (ref 70–99)
Potassium: 2.9 mmol/L — ABNORMAL LOW (ref 3.5–5.1)
Sodium: 141 mmol/L (ref 135–145)
Total Bilirubin: 0.4 mg/dL (ref 0.3–1.2)
Total Protein: 7.8 g/dL (ref 6.5–8.1)

## 2021-04-15 LAB — MAGNESIUM: Magnesium: 1.8 mg/dL (ref 1.7–2.4)

## 2021-04-15 LAB — CEA (ACCESS): CEA (CHCC): 2.09 ng/mL (ref 0.00–5.00)

## 2021-04-15 MED ORDER — DIPHENHYDRAMINE HCL 12.5 MG/5ML PO LIQD: ORAL | 5 refills | Status: DC

## 2021-04-15 MED ORDER — ATROPINE SULFATE 1 MG/ML IV SOLN
0.5000 mg | Freq: Once | INTRAVENOUS | Status: AC
  Administered 2021-04-15: 0.5 mg via INTRAVENOUS
  Filled 2021-04-15: qty 1

## 2021-04-15 MED ORDER — PALONOSETRON HCL INJECTION 0.25 MG/5ML
0.2500 mg | Freq: Once | INTRAVENOUS | Status: AC
  Administered 2021-04-15: 0.25 mg via INTRAVENOUS
  Filled 2021-04-15: qty 5

## 2021-04-15 MED ORDER — SODIUM CHLORIDE 0.9 % IV SOLN
10.0000 mg | Freq: Once | INTRAVENOUS | Status: AC
  Administered 2021-04-15: 10 mg via INTRAVENOUS
  Filled 2021-04-15: qty 1

## 2021-04-15 MED ORDER — SODIUM CHLORIDE 0.9 % IV SOLN: Freq: Once | INTRAVENOUS | Status: AC

## 2021-04-15 MED ORDER — SODIUM CHLORIDE 0.9 % IV SOLN
6.0000 mg/kg | Freq: Once | INTRAVENOUS | Status: AC
  Administered 2021-04-15: 440 mg via INTRAVENOUS
  Filled 2021-04-15: qty 20

## 2021-04-15 MED ORDER — POTASSIUM CHLORIDE 10 MEQ/100ML IV SOLN
10.0000 meq | INTRAVENOUS | Status: AC
  Administered 2021-04-15 (×2): 10 meq via INTRAVENOUS
  Filled 2021-04-15: qty 100

## 2021-04-15 MED ORDER — FLUOROURACIL CHEMO INJECTION 500 MG/10ML
200.0000 mg/m2 | Freq: Once | INTRAVENOUS | Status: AC
  Administered 2021-04-15: 350 mg via INTRAVENOUS
  Filled 2021-04-15: qty 7

## 2021-04-15 MED ORDER — SODIUM CHLORIDE 0.9 % IV SOLN
150.0000 mg | Freq: Once | INTRAVENOUS | Status: AC
  Administered 2021-04-15: 150 mg via INTRAVENOUS
  Filled 2021-04-15: qty 5

## 2021-04-15 MED ORDER — SODIUM CHLORIDE 0.9 % IV SOLN
1800.0000 mg/m2 | INTRAVENOUS | Status: DC
  Administered 2021-04-15: 3350 mg via INTRAVENOUS
  Filled 2021-04-15: qty 67

## 2021-04-15 MED ORDER — SODIUM CHLORIDE 0.9 % IV SOLN
125.0000 mg/m2 | Freq: Once | INTRAVENOUS | Status: AC
  Administered 2021-04-15: 240 mg via INTRAVENOUS
  Filled 2021-04-15: qty 10

## 2021-04-15 MED ORDER — POTASSIUM CHLORIDE ER 10 MEQ PO CPCR: 10.0000 meq | ORAL_CAPSULE | Freq: Two times a day (BID) | ORAL | 1 refills | Status: DC

## 2021-04-15 MED ORDER — SODIUM CHLORIDE 0.9 % IV SOLN
200.0000 mg/m2 | Freq: Once | INTRAVENOUS | Status: AC
  Administered 2021-04-15: 372 mg via INTRAVENOUS
  Filled 2021-04-15: qty 18.6

## 2021-04-15 NOTE — Progress Notes (Signed)
[facility] ?Clinical Social Work ? ?Clinical Social Work was referred by nurse for assessment of psychosocial needs.  Clinical Social Worker contacted patient by phone  to offer support and assess for needs.  CSW left voicemail with contact information and request for return call. ? ?First Attempt ? ?April Kassel, LCSW  ?Clinical Social Worker ?[facility] ?      ? ?

## 2021-04-15 NOTE — Progress Notes (Signed)
?[facility] ?OFFICE PROGRESS NOTE ? ? ?Diagnosis: Colon cancer ? ?INTERVAL HISTORY:  ? ?April [name] complete another cycle of FOLFIRI/panitumumab on 03/25/2021.  No nausea/vomiting.  She has loose stool, but no frank diarrhea.  She has been taking potassium every other day.  She developed skin desquamation at the hands.  She has persistent rash over the face. ? ?Objective: ? ?Vital signs in last 24 hours: ? ?Blood pressure (!) 145/80, pulse 84, temperature 97.8 ?F (36.6 ?C), temperature source Oral, resp. rate 18, height $RemoveBe'5\' 5"'ebPKoPFGj$  (1.651 m), weight 165 lb (74.8 kg), last menstrual period 01/07/2006, SpO2 98 %. ?  ? ?HEENT: No thrush or ulcers ?Resp: Lungs clear bilaterally ?Cardio: Regular rate and rhythm ?GI: No hepatosplenomegaly, no mass, nontender ?Vascular: No leg edema  ?Skin: Mild acne type rash over the face, palms without erythema ? ?Portacath/PICC-without erythema ? ?Lab Results: ? ?Lab Results  ?Component Value Date  ? WBC 5.4 04/15/2021  ? HGB 13.7 04/15/2021  ? HCT 40.4 04/15/2021  ? MCV 92.2 04/15/2021  ? PLT 292 04/15/2021  ? NEUTROABS 2.7 04/15/2021  ? ? ?CMP  ?Lab Results  ?Component Value Date  ? NA 141 03/25/2021  ? K 3.1 (L) 03/25/2021  ? CL 105 03/25/2021  ? CO2 27 03/25/2021  ? GLUCOSE 79 03/25/2021  ? BUN 8 03/25/2021  ? CREATININE 0.55 03/25/2021  ? CALCIUM 9.8 03/25/2021  ? PROT 7.1 03/25/2021  ? ALBUMIN 4.3 03/25/2021  ? AST 13 (L) 03/25/2021  ? ALT 7 03/25/2021  ? ALKPHOS 131 (H) 03/25/2021  ? BILITOT 0.4 03/25/2021  ? GFRNONAA >60 03/25/2021  ? GFRAA >60 10/17/2019  ? ? ?Lab Results  ?Component Value Date  ? CEA1 8.66 (H) 07/02/2020  ? CEA 1.85 03/25/2021  ? ? ? ?Medications: I have reviewed the patient's current medications. ? ? ?Assessment/Plan: ?Sigmoid colon cancer, stage IV (pT4a,pN2b,M1c) ?Colonoscopy 03/24/2019-3 rectal polyps-hyperplastic polyps, distal colon biopsy-at least intramucosal adenocarcinoma, completely obstructing mass in the distal sigmoid colon, could  not be traversed, intact mismatch repair protein expression ?03/24/2019-CEA 56.1 ?03/29/2019 CT abdomen/pelvis-circumferential thickening involving the entire mid and distal sigmoid colon to the level of the rectosigmoid junction, solid/cystic mass in the left ovary, bilateral nephrolithiasis ?Robotic assisted low anterior resection, mesenteric lymphadenectomy, bilateral salpingo-oophorectomy 04/08/2019 ?Pathology ([facility] review of outside pathology) metastatic adenocarcinoma involving the peritoneum overlying the round ligament, serosa of the urinary bladder, serosa of the right ureter a sacral area, pelvic peritoneum, and left ovary.  Omental biopsy with focal mucin pools with no carcinoma cells identified, right hemidiaphragm biopsy involved by metastatic adenocarcinoma, invasive adenocarcinoma the sigmoid colon, moderately differentiated, T4a, perineural and vascular invasion present, 7/22 lymph nodes, multiple tumor deposits, resection margins negative ?Negative for PD-L1, low probability of MSI-high, HER-2 negative, negative for BRAF, NRAS and KRAS alterations ?CTs 05/19/2019-no evidence of metastatic disease, findings suspicious for colitis of the transverse and ascending colon, small amount of ascites in the cul-de-sac, bilateral renal calculi ?Cycle 1 FOLFIRI 05/24/2019, bevacizumab added with cycle 2 ?Cycle 4 FOLFIRI/bevacizumab 07/05/2019 ?Cycle 5 FOLFIRI 07/27/2019, bevacizumab held secondary to hypertension ?Cycle 6 FOLFIRI 08/10/1999, bevacizumab held secondary to hypertension ?CTs 08/30/2019-no evidence of recurrent disease, new subsolid right lower lobe nodule felt to be inflammatory, emphysema ?Cycle 7 FOLFIRI 09/26/2019, bevacizumab remains on hold secondary to hypertension ?Cycle 8 FOLFIRI 10/17/2019, bevacizumab held secondary to hypertension, Udenyca added for neutropenia ?Cycle 9 FOLFIRI 11/07/2019, bevacizumab held secondary to hypertension, Udenyca ?Cycle 10 FOLFIRI 11/28/2019, bevacizumab held,  [place] ?Cycle 11  FOLFIRI 12/26/2019, bevacizumab held, [place] ?CTs 01/25/2020-no evidence of recurrent disease, resolution of right lower lobe nodule ?Maintenance Xeloda beginning 02/06/2020 ?CTs 04/25/2020- no evidence of metastatic disease, multiple bilateral renal calculi without hydronephrosis ?Maintenance Xeloda continued ?CT abdomen/pelvis without contrast 08/10/2020-bilateral staghorn renal calculi, no evidence of metastatic disease; addendum 08/23/2020-small but increasing omental nodules. ?Cycle 1 FOLFIRI/panitumumab 09/19/2020 ?Cycle 2 FOLFIRI/Panitumumab 10/03/2020 ?Cycle 3 FOLFIRI/Panitumumab 10/17/2020 ?Cycle 4 FOLFIRI/panitumumab 10/31/2020 ?Cycle 5 FOLFIRI/Panitumumab 11/14/2020 ?CTs 11/26/2020-stable omental metastases compared to 10/22/2020, mildly decreased from 08/10/2020.  Left lower quadrant tiny paracolic gutter implant slightly decreased from CT 08/10/2020.  No new or progressive metastatic disease in the abdomen or pelvis. ?Cycle 6 FOLFIRI/Panitumumab 11/28/2020, irinotecan dose reduced, treatment schedule adjusted to every 3 weeks going forward ?Cycle 7 FOLFIRI/panitumumab 12/24/2020 ?Cycle 8 FOLFIRI/Panitumumab 01/16/2021--treatment held due to hypertension in the infusion area. ?Cycle 8 FOLFIRI/panitumumab 01/21/2021 ?Cycle 9 FOLFIRI/panitumumab 02/11/2021 ?Cycle 10 FOLFIRI/Panitumumab 03/04/2021 ?Cycle 11 FOLFIRI/Panitumumab 03/25/2021 ?CT abdomen/pelvis 04/10/2021-stable right omental and left posterior paracolic gutter nodules ?Cycle 12 FOLFIRI/panitumumab 04/15/2021 ?  ?Hypertension ?G4 P3, twins ?Kidney stones-bilateral staghorn renal calculi on CT 08/10/2020 ?Infected Port-A-Cath 09/12/2019-placed on Augmentin, referred for Port-A-Cath removal; Port-A-Cath removed 09/14/2019; PICC line placed 09/14/2019; culture staph aureus.  Course of Septra completed. ?Neutropenia secondary to chemotherapy-Udenyca added with cycle 8 FOLFIRI ?Right nephrostomy tube 09/12/2020; stent placed 10/09/2020, percutaneous  nephrostolithotomy treatment of right-sided kidney stones 10/09/2020, malpositioned right ureter stent replacement 10/23/2020, right ureter stent removed 10/29/2020 ?  ? ? ?Disposition: ?April [name] appears stable.  She has been maintained on systemic therapy with FOLFIRI/panitumumab since September 2022.  She is tolerating treatment well.  The restaging CT reveals no evidence of disease progression.  The CEA remains normal.  The plan is to continue FOLFIRI/panitumumab on a 3-week schedule.  She will return for an office visit and chemotherapy in 3 weeks. ?I reviewed the CT images with her.  She will continue a moisturizer for the hand dryness. ?The potassium supplement will be changed to 20 mEq of Micro-K twice daily.  She will receive IV potassium today or on day 3. ? ? ? ?Betsy Coder, MD ? ?04/15/2021  ?9:32 AM ? ? ?

## 2021-04-15 NOTE — Progress Notes (Signed)
Patient presents for treatment. RN assessment completed along with the following: ? ?Labs/vitals reviewed - Yes, and Per Dr. Benay Spice, ok to proceed with K 2.9. See Merceda Elks, RN note.     ?Weight within 10% of previous measurement - Yes ?Informed consent completed and reflects current therapy/intent - Yes, on date 08/19/20             ?Provider progress note reviewed - Today's provider note is not yet available. I reviewed the most recent oncology provider progress note in chart dated 03/25/21. ?Treatment/Antibody/Supportive plan reviewed - Yes, and there are no adjustments needed for today's treatment. ?S&H and other orders reviewed - Yes, and patient to receive 20 MEq K along with treatment today.  ?Previous treatment date reviewed - Yes, and the appropriate amount of time has elapsed between treatments. ?Clinic Hand Off Received from - Merceda Elks, RN ? ?Patient to proceed with treatment.   ?

## 2021-04-15 NOTE — Progress Notes (Signed)
Patient seen by Dr. Benay Spice today ? ?Vitals are within treatment parameters. ? ?Labs reviewed by Dr. Benay Spice and are not all within treatment parameters. K+ =2.9 ? ?Per physician team, patient is ready for treatment. Please note that modifications are being made to the treatment plan including Will receive 20 meq KCL IV  today. Has also adjusted her home KCL dose. ?

## 2021-04-15 NOTE — Patient Instructions (Signed)
PICC Home Care Guide A peripherally inserted central catheter (PICC) is a form of IV access that allows medicines and IV fluids to be quickly put into the blood and spread throughout the body. The PICC is a long, thin, flexible tube (catheter) that is put into a vein in a person's arm or leg. The catheter ends in a large vein just outside the heart called the superior vena cava (SVC). After the PICC is put in, a chest X-ray may be done to make sure that it is in the right place. A PICC may be placed for different reasons, such as: To give medicines and liquid nutrition. To give IV fluids and blood products. To take blood samples often. If there is trouble placing a peripheral intravenous (PIV) catheter. If cared for properly, a PICC can remain in place for many months. Having a PICC can allow you to go home from the hospital sooner and continue treatment at home. Medicines and PICC care can be managed at home by a family member, caregiver, or home health care team. What are the risks? Generally, having a PICC is safe. However, problems may occur, including: A blood clot (thrombus) forming in or at the end of the PICC. A blood clot forming in a vein (deep vein thrombosis) or traveling to the lung (pulmonary embolism). Inflammation of the vein (phlebitis) in which the PICC is placed. Infection at the insertion site or in the blood. Blood infections from central lines, like PICCs, can be serious and often require a hospital stay. PICC malposition, or PICC movement or poor placement. A break or cut in the PICC. Do not use scissors near the PICC. Nerve or tendon irritation or injury during PICC insertion. How to care for your PICC Please follow the specific guidelines provided by your health care provider. Preventing infection You and any caregivers should wash your hands often with soap and water for at least 20 seconds. Wash hands: Before touching the PICC or the infusion device. Before changing a  bandage (dressing). Do not change the dressing unless you have been taught to do so and have shown you are able to change it safely. Flush the PICC as told. Tell your health care provider right away if the PICC is hard to flush or does not flush. Do not use force to flush the PICC. Use clean and germ-free (sterile) supplies only. Keep the supplies in a dry place. Do not reuse needles, syringes, or any other supplies. Reusing supplies can lead to infection. Keep the PICC dressing dry and secure it with tape if the edges stop sticking to your skin. Check your PICC insertion site every day for signs of infection. Check for: Redness, swelling, or pain. Fluid or blood. Warmth. Pus or a bad smell. Preventing other problems Do not use a syringe that is less than 10 mL to flush the PICC. Do not have your blood pressure checked on the arm in which the PICC is placed. Do not ever pull or tug on the PICC. Keep it secured to your arm with tape or a stretch wrap when not in use. Do not take the PICC out yourself. Only a trained health care provider should remove the PICC. Keep pets and children away from your PICC. How to care for your PICC dressing Keep your PICC dressing clean and dry to prevent infection. Do not take baths, swim, or use a hot tub until your health care provider approves. Ask your health care provider if you can take   showers. You may only be allowed to take sponge baths. When you are allowed to shower: Ask your health care provider to teach you how to wrap the PICC. Cover the PICC with clear plastic wrap and tape to keep it dry while showering. Follow instructions from your health care provider about how to take care of your insertion site and dressing. Make sure you: Wash your hands with soap and water for at least 20 seconds before and after you change your dressing. If soap and water are not available, use hand sanitizer. Change your dressing only if taught to do so by your health care  provider. Your PICC dressing needs to be changed if it becomes loose or wet. Leave stitches (sutures), skin glue, or adhesive strips in place. These skin closures may need to stay in place for 2 weeks or longer. If adhesive strip edges start to loosen and curl up, you may trim the loose edges. Do not remove adhesive strips completely unless your health care provider tells you to do that. Follow these instructions at home: Disposal of supplies Throw away any syringes in a disposal container that is meant for sharp items (sharps container). You can buy a sharps container from a pharmacy, or you can make one by using an empty, hard plastic bottle with a lid. Place any used dressings or infusion bags into a plastic bag. Throw that bag in the trash. General instructions  Always carry your PICC identification card or wear a medical alert bracelet. Keep the tube clamped at all times, unless it is being used. Always carry a smooth-edge clamp with you to clamp the PICC if it breaks. Do not use scissors or sharp objects near the tube. You may bend your arm and move it freely. If your PICC is near or at the bend of your elbow, avoid activity with repeated motion at the elbow. Avoid lifting heavy objects as told by your health care provider. Keep all follow-up visits. This is important. You will need to have your PICC dressing changed at least once a week. Contact a health care provider if: You have pain in your arm, ear, face, or teeth. You have a fever or chills. You have redness, swelling, or pain around the insertion site. You have fluid or blood coming from the insertion site. Your insertion site feels warm to the touch. You have pus or a bad smell coming from the insertion site. Your skin feels hard and raised around the insertion site. Your PICC dressing has gotten wet or is coming off and you have not been taught how to change it. Get help right away if: You have problems with your PICC, such as  your PICC: Was tugged or pulled and has partially come out. Do not  push the PICC back in. Cannot be flushed, is hard to flush, or leaks around the insertion site when it is flushed. Makes a flushing sound when it is flushed. Appears to have a hole or tear. Is accidentally pulled all the way out. If this happens, cover the insertion site with a gauze dressing. Do not throw the PICC away. Your health care provider will need to check it to be sure the entire catheter came out. You feel your heart racing or skipping beats, or you have chest pain. You have shortness of breath or trouble breathing. You have swelling, redness, warmth, or pain in the arm in which the PICC is placed. You have a red streak going up your arm that   starts under the PICC dressing. These symptoms may be an emergency. Get help right away. Call 911. Do not wait to see if the symptoms will go away. Do not drive yourself to the hospital. Summary A peripherally inserted central catheter (PICC) is a long, thin, flexible tube (catheter) that is put into a vein in the arm or leg. If cared for properly, a PICC can remain in place for many months. Having a PICC can allow you to go home from the hospital sooner and continue treatment at home. The PICC is inserted using a germ-free (sterile) technique by a specially trained health care provider. Only a trained health care provider should remove it. Do not have your blood pressure checked on the arm in which your PICC is placed. Always keep your PICC identification card with you. This information is not intended to replace advice given to you by your health care provider. Make sure you discuss any questions you have with your health care provider. Document Revised: 07/18/2020 Document Reviewed: 07/18/2020 Elsevier Patient Education  2022 [employer]  

## 2021-04-15 NOTE — Addendum Note (Signed)
Addended by: Lenox Ponds E on: 04/15/2021 10:20 AM ? ? Modules accepted: Orders ? ?

## 2021-04-15 NOTE — Patient Instructions (Signed)
[facility]  Discharge Instructions: ?Thank you for choosing [facility] to provide your oncology and hematology care.  ? ?If you have a lab appointment with the [facility], please go directly to the [facility] and check in at the registration area. ?  ?Wear comfortable clothing and clothing appropriate for easy access to any Portacath or PICC line.  ? ?We strive to give you quality time with your provider. You may need to reschedule your appointment if you arrive late (15 or more minutes).  Arriving late affects you and other patients whose appointments are after yours.  Also, if you miss three or more appointments without notifying the office, you may be dismissed from the clinic at the provider?s discretion.    ?  ?For prescription refill requests, have your pharmacy contact our office and allow 72 hours for refills to be completed.   ? ?Today you received the following chemotherapy and/or immunotherapy agents Vectibix, Fofiri    ?  ?To help prevent nausea and vomiting after your treatment, we encourage you to take your nausea medication as directed. ? ?BELOW ARE SYMPTOMS THAT SHOULD BE REPORTED IMMEDIATELY: ?*FEVER GREATER THAN 100.4 F (38 ?C) OR HIGHER ?*CHILLS OR SWEATING ?*NAUSEA AND VOMITING THAT IS NOT CONTROLLED WITH YOUR NAUSEA MEDICATION ?*UNUSUAL SHORTNESS OF BREATH ?*UNUSUAL BRUISING OR BLEEDING ?*URINARY PROBLEMS (pain or burning when urinating, or frequent urination) ?*BOWEL PROBLEMS (unusual diarrhea, constipation, pain near the anus) ?TENDERNESS IN MOUTH AND THROAT WITH OR WITHOUT PRESENCE OF ULCERS (sore throat, sores in mouth, or a toothache) ?UNUSUAL RASH, SWELLING OR PAIN  ?UNUSUAL VAGINAL DISCHARGE OR ITCHING  ? ?Items with * indicate a potential emergency and should be followed up as soon as possible or go to the Emergency Department if any problems should occur. ? ?Please show the CHEMOTHERAPY ALERT CARD or IMMUNOTHERAPY ALERT CARD at check-in  to the Emergency Department and triage nurse. ? ?Should you have questions after your visit or need to cancel or reschedule your appointment, please contact [facility]  Dept: [phone]  and follow the prompts.  Office hours are 8:00 a.m. to 4:30 p.m. Monday - Friday. Please note that voicemails left after 4:00 p.m. may not be returned until the following business day.  We are closed weekends and major holidays. You have access to a nurse at all times for urgent questions. Please call the main number to the clinic Dept: [phone] and follow the prompts. ? ? ?For any non-urgent questions, you may also contact your provider using MyChart. We now offer e-Visits for anyone 48 and older to request care online for non-urgent symptoms. For details visit [email or web address]. ?  ?Also download the MyChart app! Go to the app store, search "MyChart", open the app, select [facility], and log in with your MyChart username and password. ? ?Due to Covid, a mask is required upon entering the hospital/clinic. If you do not have a mask, one will be given to you upon arrival. For doctor visits, patients may have 1 support person aged 48 or older with them. For treatment visits, patients cannot have anyone with them due to current Covid guidelines and our immunocompromised population.  ? ?

## 2021-04-15 NOTE — Telephone Encounter (Signed)
Message sent to [facility]. Pt received a note stating she would have to move from her current residence in less then 30 days.Teresita responded and stated she would reach out to the Pt. ?

## 2021-04-16 ENCOUNTER — Encounter: Payer: Self-pay | Admitting: Licensed Clinical Social Worker

## 2021-04-16 NOTE — Progress Notes (Unsigned)
[facility]  ?Initial Assessment ? ? ?April [name] is a 46 y.o. year old female contacted by phone. Clinical Social Work was referred by nurse navigator for assessment of psychosocial needs.  ? ?SDOH (Social Determinants of Health) assessments performed: Yes ?SDOH Interventions   ? ?Flowsheet Row Most Recent Value  ?SDOH Interventions   ?Food Insecurity Interventions [id number] Referral, Intervention Not Indicated  ?Financial Strain Interventions Intervention Not Indicated, [id number] Referral, Financial Counselor  ?Housing Interventions Intervention Not Indicated, [id number] Referral  ?Intimate Partner Violence Interventions Intervention Not Indicated  ?Physical Activity Interventions Intervention Not Indicated  ?Stress Interventions Intervention Not Indicated, Provide Counseling  ?Social Connections Interventions Intervention Not Indicated  ?Transportation Interventions Intervention Not Indicated  ?Depression Interventions/Treatment  Counseling  ? ?  ?  ?Distress Screen completed: Yes ? ?  07/21/2019  ?  8:37 AM  ?ONCBCN DISTRESS SCREENING  ?Screening Type Initial Screening  ?Distress experienced in past week (1-10) 3  ?Emotional problem type Adjusting to appearance changes  ?Information Concerns Type Lack of info about diagnosis;Lack of info about complementary therapy choices  ?Physical Problem type Nausea/vomiting;Constipation/diarrhea  ?Physician notified of physical symptoms Yes  ?Referral to clinical psychology No  ?Referral to clinical social work Yes  ?Referral to dietition No  ?Referral to financial advocate No  ?Referral to palliative care No  ? ? ? ? ?Family/Social Information:  ?Housing Arrangement: patient lives with adult son, and minor daughters . ?Family members/support persons in your life? Family and Medical Staff ?Transportation concerns: no  ?Employment: Disabled. Income source: Social Security Disability ?Financial concerns: Yes, current concerns ?Type of concern: Rent/ mortgage  and finding new house to live in and afford first and last month's rent. ?Food access concerns: no ?Religious or spiritual practice: N/A ?Services Currently in place:  Disability and Medicaid ? ?Coping/ Adjustment to diagnosis: ?Patient understands treatment plan and what happens next? yes ?Concerns about diagnosis and/or treatment: How I will pay for the services I need ?Patient reported stressors: Housing and Anxiety ?Hopes and priorities: To find a new home by the end of the month ?Patient enjoys time with family/ friends ?Current coping skills/ strengths: Average or above average intelligence , Capable of independent living , Communication skills , Motivation for treatment/growth , and Supportive family/friends  ? ? ? SUMMARY: ?Current SDOH Barriers:  ?Financial constraints related to fixed income and Housing barriers ? ?Clinical Social Work Clinical Goal(s):  ?patient will follow up with [employer]* as directed by SW ? ?Interventions: ?Discussed common feeling and emotions when being diagnosed with cancer, and the importance of support during treatment ?Informed patient of the support team roles and support services at [facility] ?Provided CSW contact information and encouraged patient to call with any questions or concerns ?Referred patient to [facility], [facility], [employer] and Provided patient with information about CSW role in patient care and other available resources ? ? ?Follow Up Plan: Patient will contact CSW with any support or resource needs and CSW will follow-up with patient by phone  ?Patient verbalizes understanding of plan: Yes ? ?Patient has received eviction notice from landlord.  Patient must evict premises by April 30th, 2023.  Patient was aware landlord was selling home.  Patient needs assistance with finding new rental property.  CSW referred patient to [facility], [phone] and [employer]. ? ?Rashad Obeid, LCSW ?

## 2021-04-17 ENCOUNTER — Inpatient Hospital Stay: Payer: Medicaid Other

## 2021-04-17 VITALS — BP 138/98 | HR 80 | Temp 97.7°F | Resp 18

## 2021-04-17 DIAGNOSIS — Z5111 Encounter for antineoplastic chemotherapy: Secondary | ICD-10-CM | POA: Diagnosis not present

## 2021-04-17 DIAGNOSIS — C20 Malignant neoplasm of rectum: Secondary | ICD-10-CM

## 2021-04-17 MED ORDER — HEPARIN SOD (PORK) LOCK FLUSH 100 UNIT/ML IV SOLN
500.0000 [IU] | Freq: Once | INTRAVENOUS | Status: AC | PRN
  Administered 2021-04-17: 500 [IU]

## 2021-04-17 MED ORDER — SODIUM CHLORIDE 0.9% FLUSH
10.0000 mL | INTRAVENOUS | Status: DC | PRN
  Administered 2021-04-17: 10 mL

## 2021-04-22 ENCOUNTER — Inpatient Hospital Stay: Payer: Medicaid Other

## 2021-04-22 ENCOUNTER — Other Ambulatory Visit: Payer: Self-pay | Admitting: *Deleted

## 2021-04-22 VITALS — BP 144/100 | HR 94 | Resp 18

## 2021-04-22 DIAGNOSIS — Z5111 Encounter for antineoplastic chemotherapy: Secondary | ICD-10-CM | POA: Diagnosis not present

## 2021-04-22 DIAGNOSIS — C20 Malignant neoplasm of rectum: Secondary | ICD-10-CM

## 2021-04-22 DIAGNOSIS — C19 Malignant neoplasm of rectosigmoid junction: Secondary | ICD-10-CM

## 2021-04-22 DIAGNOSIS — Z95828 Presence of other vascular implants and grafts: Secondary | ICD-10-CM

## 2021-04-22 MED ORDER — SODIUM CHLORIDE 0.9% FLUSH
10.0000 mL | Freq: Once | INTRAVENOUS | Status: AC
  Administered 2021-04-22: 10 mL

## 2021-04-22 MED ORDER — DOXYCYCLINE HYCLATE 100 MG PO CAPS: 100.0000 mg | ORAL_CAPSULE | Freq: Two times a day (BID) | ORAL | 0 refills | Status: DC

## 2021-04-22 MED ORDER — DEXAMETHASONE 4 MG PO TABS: 4.0000 mg | ORAL_TABLET | Freq: Two times a day (BID) | ORAL | 0 refills | Status: DC

## 2021-04-22 MED ORDER — POTASSIUM CHLORIDE ER 10 MEQ PO CPCR: 10.0000 meq | ORAL_CAPSULE | Freq: Two times a day (BID) | ORAL | 0 refills | Status: DC

## 2021-04-22 MED ORDER — HEPARIN SOD (PORK) LOCK FLUSH 100 UNIT/ML IV SOLN
500.0000 [IU] | Freq: Once | INTRAVENOUS | Status: AC
  Administered 2021-04-22: 250 [IU]

## 2021-04-22 NOTE — Progress Notes (Signed)
Confirmed w/patient that she does wish to change her pharmacy for routine medications to [facility] in [place], [place]. Updated scripts for dexamethasone, doxycycline and potassium chloride to this pharmacy. Made them aware that PCP, Dr. Jacolyn Reedy. ?

## 2021-04-29 ENCOUNTER — Inpatient Hospital Stay: Payer: Medicaid Other

## 2021-04-29 VITALS — BP 128/96 | HR 83 | Temp 98.5°F | Resp 18

## 2021-04-29 DIAGNOSIS — C20 Malignant neoplasm of rectum: Secondary | ICD-10-CM

## 2021-04-29 DIAGNOSIS — Z5111 Encounter for antineoplastic chemotherapy: Secondary | ICD-10-CM | POA: Diagnosis not present

## 2021-04-29 DIAGNOSIS — Z95828 Presence of other vascular implants and grafts: Secondary | ICD-10-CM

## 2021-04-29 MED ORDER — SODIUM CHLORIDE 0.9% FLUSH
10.0000 mL | Freq: Once | INTRAVENOUS | Status: AC
  Administered 2021-04-29: 10 mL

## 2021-04-29 MED ORDER — HEPARIN SOD (PORK) LOCK FLUSH 100 UNIT/ML IV SOLN
500.0000 [IU] | Freq: Once | INTRAVENOUS | Status: AC
  Administered 2021-04-29: 500 [IU]

## 2021-04-29 NOTE — Progress Notes (Signed)
Patient arrived for her picc dressing change.  Patient had taped 2 gauze pads over her dressing. Underneath the gauze pads, the original picc dressing had a tear where the biopatch sits.  Patient stated she didn't know how the tear in the dressing happened, but she did state that she had been itching the area where the hold occurred.  Patient reminded to not scratch the dressing, as the dressing must stay intact to keep the site from becoming infected.  Patient verbalized understanding. According to RN that changed her dressing on 04/22/21, this same incident also occurred. Dr. Benay Spice made aware, no new orders at this time.  ?

## 2021-04-29 NOTE — Patient Instructions (Signed)
PICC Home Care Guide A peripherally inserted central catheter (PICC) is a form of IV access that allows medicines and IV fluids to be quickly put into the blood and spread throughout the body. The PICC is a long, thin, flexible tube (catheter) that is put into a vein in a person's arm or leg. The catheter ends in a large vein just outside the heart called the superior vena cava (SVC). After the PICC is put in, a chest X-ray may be done to make sure that it is in the right place. A PICC may be placed for different reasons, such as: To give medicines and liquid nutrition. To give IV fluids and blood products. To take blood samples often. If there is trouble placing a peripheral intravenous (PIV) catheter. If cared for properly, a PICC can remain in place for many months. Having a PICC can allow you to go home from the hospital sooner and continue treatment at home. Medicines and PICC care can be managed at home by a family member, caregiver, or home health care team. What are the risks? Generally, having a PICC is safe. However, problems may occur, including: A blood clot (thrombus) forming in or at the end of the PICC. A blood clot forming in a vein (deep vein thrombosis) or traveling to the lung (pulmonary embolism). Inflammation of the vein (phlebitis) in which the PICC is placed. Infection at the insertion site or in the blood. Blood infections from central lines, like PICCs, can be serious and often require a hospital stay. PICC malposition, or PICC movement or poor placement. A break or cut in the PICC. Do not use scissors near the PICC. Nerve or tendon irritation or injury during PICC insertion. How to care for your PICC Please follow the specific guidelines provided by your health care provider. Preventing infection You and any caregivers should wash your hands often with soap and water for at least 20 seconds. Wash hands: Before touching the PICC or the infusion device. Before changing a  bandage (dressing). Do not change the dressing unless you have been taught to do so and have shown you are able to change it safely. Flush the PICC as told. Tell your health care provider right away if the PICC is hard to flush or does not flush. Do not use force to flush the PICC. Use clean and germ-free (sterile) supplies only. Keep the supplies in a dry place. Do not reuse needles, syringes, or any other supplies. Reusing supplies can lead to infection. Keep the PICC dressing dry and secure it with tape if the edges stop sticking to your skin. Check your PICC insertion site every day for signs of infection. Check for: Redness, swelling, or pain. Fluid or blood. Warmth. Pus or a bad smell. Preventing other problems Do not use a syringe that is less than 10 mL to flush the PICC. Do not have your blood pressure checked on the arm in which the PICC is placed. Do not ever pull or tug on the PICC. Keep it secured to your arm with tape or a stretch wrap when not in use. Do not take the PICC out yourself. Only a trained health care provider should remove the PICC. Keep pets and children away from your PICC. How to care for your PICC dressing Keep your PICC dressing clean and dry to prevent infection. Do not take baths, swim, or use a hot tub until your health care provider approves. Ask your health care provider if you can take   showers. You may only be allowed to take sponge baths. When you are allowed to shower: Ask your health care provider to teach you how to wrap the PICC. Cover the PICC with clear plastic wrap and tape to keep it dry while showering. Follow instructions from your health care provider about how to take care of your insertion site and dressing. Make sure you: Wash your hands with soap and water for at least 20 seconds before and after you change your dressing. If soap and water are not available, use hand sanitizer. Change your dressing only if taught to do so by your health care  provider. Your PICC dressing needs to be changed if it becomes loose or wet. Leave stitches (sutures), skin glue, or adhesive strips in place. These skin closures may need to stay in place for 2 weeks or longer. If adhesive strip edges start to loosen and curl up, you may trim the loose edges. Do not remove adhesive strips completely unless your health care provider tells you to do that. Follow these instructions at home: Disposal of supplies Throw away any syringes in a disposal container that is meant for sharp items (sharps container). You can buy a sharps container from a pharmacy, or you can make one by using an empty, hard plastic bottle with a lid. Place any used dressings or infusion bags into a plastic bag. Throw that bag in the trash. General instructions  Always carry your PICC identification card or wear a medical alert bracelet. Keep the tube clamped at all times, unless it is being used. Always carry a smooth-edge clamp with you to clamp the PICC if it breaks. Do not use scissors or sharp objects near the tube. You may bend your arm and move it freely. If your PICC is near or at the bend of your elbow, avoid activity with repeated motion at the elbow. Avoid lifting heavy objects as told by your health care provider. Keep all follow-up visits. This is important. You will need to have your PICC dressing changed at least once a week. Contact a health care provider if: You have pain in your arm, ear, face, or teeth. You have a fever or chills. You have redness, swelling, or pain around the insertion site. You have fluid or blood coming from the insertion site. Your insertion site feels warm to the touch. You have pus or a bad smell coming from the insertion site. Your skin feels hard and raised around the insertion site. Your PICC dressing has gotten wet or is coming off and you have not been taught how to change it. Get help right away if: You have problems with your PICC, such as  your PICC: Was tugged or pulled and has partially come out. Do not  push the PICC back in. Cannot be flushed, is hard to flush, or leaks around the insertion site when it is flushed. Makes a flushing sound when it is flushed. Appears to have a hole or tear. Is accidentally pulled all the way out. If this happens, cover the insertion site with a gauze dressing. Do not throw the PICC away. Your health care provider will need to check it to be sure the entire catheter came out. You feel your heart racing or skipping beats, or you have chest pain. You have shortness of breath or trouble breathing. You have swelling, redness, warmth, or pain in the arm in which the PICC is placed. You have a red streak going up your arm that   starts under the PICC dressing. These symptoms may be an emergency. Get help right away. Call 911. Do not wait to see if the symptoms will go away. Do not drive yourself to the hospital. Summary A peripherally inserted central catheter (PICC) is a long, thin, flexible tube (catheter) that is put into a vein in the arm or leg. If cared for properly, a PICC can remain in place for many months. Having a PICC can allow you to go home from the hospital sooner and continue treatment at home. The PICC is inserted using a germ-free (sterile) technique by a specially trained health care provider. Only a trained health care provider should remove it. Do not have your blood pressure checked on the arm in which your PICC is placed. Always keep your PICC identification card with you. This information is not intended to replace advice given to you by your health care provider. Make sure you discuss any questions you have with your health care provider. Document Revised: 07/18/2020 Document Reviewed: 07/18/2020 Elsevier Patient Education  2023 Elsevier Inc.  

## 2021-05-04 ENCOUNTER — Other Ambulatory Visit: Payer: Self-pay | Admitting: Oncology

## 2021-05-06 ENCOUNTER — Inpatient Hospital Stay: Payer: Medicaid Other

## 2021-05-06 ENCOUNTER — Encounter: Payer: Self-pay | Admitting: *Deleted

## 2021-05-06 ENCOUNTER — Other Ambulatory Visit (HOSPITAL_BASED_OUTPATIENT_CLINIC_OR_DEPARTMENT_OTHER): Payer: Self-pay

## 2021-05-06 ENCOUNTER — Inpatient Hospital Stay (HOSPITAL_BASED_OUTPATIENT_CLINIC_OR_DEPARTMENT_OTHER): Payer: Medicaid Other | Admitting: Oncology

## 2021-05-06 VITALS — BP 150/84 | HR 71 | Temp 98.1°F | Resp 18 | Ht 65.0 in | Wt 168.6 lb

## 2021-05-06 DIAGNOSIS — C19 Malignant neoplasm of rectosigmoid junction: Secondary | ICD-10-CM | POA: Diagnosis not present

## 2021-05-06 DIAGNOSIS — C20 Malignant neoplasm of rectum: Secondary | ICD-10-CM | POA: Diagnosis not present

## 2021-05-06 DIAGNOSIS — Z5111 Encounter for antineoplastic chemotherapy: Secondary | ICD-10-CM | POA: Diagnosis not present

## 2021-05-06 LAB — CBC WITH DIFFERENTIAL (CANCER CENTER ONLY)
Abs Immature Granulocytes: 0 10*3/uL (ref 0.00–0.07)
Basophils Absolute: 0 10*3/uL (ref 0.0–0.1)
Basophils Relative: 1 %
Eosinophils Absolute: 0.2 10*3/uL (ref 0.0–0.5)
Eosinophils Relative: 4 %
HCT: 43.1 % (ref 36.0–46.0)
Hemoglobin: 14.1 g/dL (ref 12.0–15.0)
Immature Granulocytes: 0 %
Lymphocytes Relative: 51 %
Lymphs Abs: 2.7 10*3/uL (ref 0.7–4.0)
MCH: 30.8 pg (ref 26.0–34.0)
MCHC: 32.7 g/dL (ref 30.0–36.0)
MCV: 94.1 fL (ref 80.0–100.0)
Monocytes Absolute: 0.5 10*3/uL (ref 0.1–1.0)
Monocytes Relative: 10 %
Neutro Abs: 1.8 10*3/uL (ref 1.7–7.7)
Neutrophils Relative %: 34 %
Platelet Count: 264 10*3/uL (ref 150–400)
RBC: 4.58 MIL/uL (ref 3.87–5.11)
RDW: 15.9 % — ABNORMAL HIGH (ref 11.5–15.5)
WBC Count: 5.3 10*3/uL (ref 4.0–10.5)
nRBC: 0 % (ref 0.0–0.2)

## 2021-05-06 LAB — CMP (CANCER CENTER ONLY)
ALT: 12 U/L (ref 0–44)
AST: 15 U/L (ref 15–41)
Albumin: 4.5 g/dL (ref 3.5–5.0)
Alkaline Phosphatase: 140 U/L — ABNORMAL HIGH (ref 38–126)
Anion gap: 10 (ref 5–15)
BUN: 9 mg/dL (ref 6–20)
CO2: 27 mmol/L (ref 22–32)
Calcium: 10.4 mg/dL — ABNORMAL HIGH (ref 8.9–10.3)
Chloride: 101 mmol/L (ref 98–111)
Creatinine: 0.61 mg/dL (ref 0.44–1.00)
GFR, Estimated: >60 mL/min (ref >60)
Glucose, Bld: 97 mg/dL (ref 70–99)
Potassium: 3.5 mmol/L (ref 3.5–5.1)
Sodium: 138 mmol/L (ref 135–145)
Total Bilirubin: 0.3 mg/dL (ref 0.3–1.2)
Total Protein: 7.6 g/dL (ref 6.5–8.1)

## 2021-05-06 LAB — CEA (ACCESS): CEA (CHCC): 2.23 ng/mL (ref 0.00–5.00)

## 2021-05-06 LAB — MAGNESIUM: Magnesium: 1.9 mg/dL (ref 1.7–2.4)

## 2021-05-06 MED ORDER — SODIUM CHLORIDE 0.9 % IV SOLN
125.0000 mg/m2 | Freq: Once | INTRAVENOUS | Status: AC
  Administered 2021-05-06: 240 mg via INTRAVENOUS
  Filled 2021-05-06: qty 10

## 2021-05-06 MED ORDER — ATROPINE SULFATE 1 MG/ML IV SOLN
0.5000 mg | Freq: Once | INTRAVENOUS | Status: AC
  Administered 2021-05-06: 0.5 mg via INTRAVENOUS
  Filled 2021-05-06: qty 1

## 2021-05-06 MED ORDER — HYDROCODONE-ACETAMINOPHEN 5-325 MG PO TABS
1.0000 | ORAL_TABLET | Freq: Four times a day (QID) | ORAL | 0 refills | Status: DC | PRN
  Filled 2021-05-06: qty 30, 8d supply, fill #0

## 2021-05-06 MED ORDER — FLUOROURACIL CHEMO INJECTION 500 MG/10ML
200.0000 mg/m2 | Freq: Once | INTRAVENOUS | Status: AC
  Administered 2021-05-06: 350 mg via INTRAVENOUS
  Filled 2021-05-06: qty 7

## 2021-05-06 MED ORDER — SODIUM CHLORIDE 0.9 % IV SOLN
150.0000 mg | Freq: Once | INTRAVENOUS | Status: AC
  Administered 2021-05-06: 150 mg via INTRAVENOUS
  Filled 2021-05-06: qty 150

## 2021-05-06 MED ORDER — SODIUM CHLORIDE 0.9 % IV SOLN
200.0000 mg/m2 | Freq: Once | INTRAVENOUS | Status: AC
  Administered 2021-05-06: 372 mg via INTRAVENOUS
  Filled 2021-05-06: qty 18.6

## 2021-05-06 MED ORDER — SODIUM CHLORIDE 0.9 % IV SOLN: Freq: Once | INTRAVENOUS | Status: AC

## 2021-05-06 MED ORDER — PALONOSETRON HCL INJECTION 0.25 MG/5ML
0.2500 mg | Freq: Once | INTRAVENOUS | Status: AC
  Administered 2021-05-06: 0.25 mg via INTRAVENOUS
  Filled 2021-05-06: qty 5

## 2021-05-06 MED ORDER — SODIUM CHLORIDE 0.9 % IV SOLN
10.0000 mg | Freq: Once | INTRAVENOUS | Status: AC
  Administered 2021-05-06: 10 mg via INTRAVENOUS
  Filled 2021-05-06: qty 10

## 2021-05-06 MED ORDER — SODIUM CHLORIDE 0.9 % IV SOLN
6.0000 mg/kg | Freq: Once | INTRAVENOUS | Status: AC
  Administered 2021-05-06: 440 mg via INTRAVENOUS
  Filled 2021-05-06: qty 20

## 2021-05-06 MED ORDER — SODIUM CHLORIDE 0.9 % IV SOLN
1800.0000 mg/m2 | INTRAVENOUS | Status: DC
  Administered 2021-05-06: 3350 mg via INTRAVENOUS
  Filled 2021-05-06: qty 67

## 2021-05-06 NOTE — Patient Instructions (Signed)
PICC Home Care Guide A peripherally inserted central catheter (PICC) is a form of IV access that allows medicines and IV fluids to be quickly put into the blood and spread throughout the body. The PICC is a long, thin, flexible tube (catheter) that is put into a vein in a person's arm or leg. The catheter ends in a large vein just outside the heart called the superior vena cava (SVC). After the PICC is put in, a chest X-ray may be done to make sure that it is in the right place. A PICC may be placed for different reasons, such as: To give medicines and liquid nutrition. To give IV fluids and blood products. To take blood samples often. If there is trouble placing a peripheral intravenous (PIV) catheter. If cared for properly, a PICC can remain in place for many months. Having a PICC can allow you to go home from the hospital sooner and continue treatment at home. Medicines and PICC care can be managed at home by a family member, caregiver, or home health care team. What are the risks? Generally, having a PICC is safe. However, problems may occur, including: A blood clot (thrombus) forming in or at the end of the PICC. A blood clot forming in a vein (deep vein thrombosis) or traveling to the lung (pulmonary embolism). Inflammation of the vein (phlebitis) in which the PICC is placed. Infection at the insertion site or in the blood. Blood infections from central lines, like PICCs, can be serious and often require a hospital stay. PICC malposition, or PICC movement or poor placement. A break or cut in the PICC. Do not use scissors near the PICC. Nerve or tendon irritation or injury during PICC insertion. How to care for your PICC Please follow the specific guidelines provided by your health care provider. Preventing infection You and any caregivers should wash your hands often with soap and water for at least 20 seconds. Wash hands: Before touching the PICC or the infusion device. Before changing a  bandage (dressing). Do not change the dressing unless you have been taught to do so and have shown you are able to change it safely. Flush the PICC as told. Tell your health care provider right away if the PICC is hard to flush or does not flush. Do not use force to flush the PICC. Use clean and germ-free (sterile) supplies only. Keep the supplies in a dry place. Do not reuse needles, syringes, or any other supplies. Reusing supplies can lead to infection. Keep the PICC dressing dry and secure it with tape if the edges stop sticking to your skin. Check your PICC insertion site every day for signs of infection. Check for: Redness, swelling, or pain. Fluid or blood. Warmth. Pus or a bad smell. Preventing other problems Do not use a syringe that is less than 10 mL to flush the PICC. Do not have your blood pressure checked on the arm in which the PICC is placed. Do not ever pull or tug on the PICC. Keep it secured to your arm with tape or a stretch wrap when not in use. Do not take the PICC out yourself. Only a trained health care provider should remove the PICC. Keep pets and children away from your PICC. How to care for your PICC dressing Keep your PICC dressing clean and dry to prevent infection. Do not take baths, swim, or use a hot tub until your health care provider approves. Ask your health care provider if you can take   showers. You may only be allowed to take sponge baths. When you are allowed to shower: Ask your health care provider to teach you how to wrap the PICC. Cover the PICC with clear plastic wrap and tape to keep it dry while showering. Follow instructions from your health care provider about how to take care of your insertion site and dressing. Make sure you: Wash your hands with soap and water for at least 20 seconds before and after you change your dressing. If soap and water are not available, use hand sanitizer. Change your dressing only if taught to do so by your health care  provider. Your PICC dressing needs to be changed if it becomes loose or wet. Leave stitches (sutures), skin glue, or adhesive strips in place. These skin closures may need to stay in place for 2 weeks or longer. If adhesive strip edges start to loosen and curl up, you may trim the loose edges. Do not remove adhesive strips completely unless your health care provider tells you to do that. Follow these instructions at home: Disposal of supplies Throw away any syringes in a disposal container that is meant for sharp items (sharps container). You can buy a sharps container from a pharmacy, or you can make one by using an empty, hard plastic bottle with a lid. Place any used dressings or infusion bags into a plastic bag. Throw that bag in the trash. General instructions  Always carry your PICC identification card or wear a medical alert bracelet. Keep the tube clamped at all times, unless it is being used. Always carry a smooth-edge clamp with you to clamp the PICC if it breaks. Do not use scissors or sharp objects near the tube. You may bend your arm and move it freely. If your PICC is near or at the bend of your elbow, avoid activity with repeated motion at the elbow. Avoid lifting heavy objects as told by your health care provider. Keep all follow-up visits. This is important. You will need to have your PICC dressing changed at least once a week. Contact a health care provider if: You have pain in your arm, ear, face, or teeth. You have a fever or chills. You have redness, swelling, or pain around the insertion site. You have fluid or blood coming from the insertion site. Your insertion site feels warm to the touch. You have pus or a bad smell coming from the insertion site. Your skin feels hard and raised around the insertion site. Your PICC dressing has gotten wet or is coming off and you have not been taught how to change it. Get help right away if: You have problems with your PICC, such as  your PICC: Was tugged or pulled and has partially come out. Do not  push the PICC back in. Cannot be flushed, is hard to flush, or leaks around the insertion site when it is flushed. Makes a flushing sound when it is flushed. Appears to have a hole or tear. Is accidentally pulled all the way out. If this happens, cover the insertion site with a gauze dressing. Do not throw the PICC away. Your health care provider will need to check it to be sure the entire catheter came out. You feel your heart racing or skipping beats, or you have chest pain. You have shortness of breath or trouble breathing. You have swelling, redness, warmth, or pain in the arm in which the PICC is placed. You have a red streak going up your arm that   starts under the PICC dressing. These symptoms may be an emergency. Get help right away. Call 911. Do not wait to see if the symptoms will go away. Do not drive yourself to the hospital. Summary A peripherally inserted central catheter (PICC) is a long, thin, flexible tube (catheter) that is put into a vein in the arm or leg. If cared for properly, a PICC can remain in place for many months. Having a PICC can allow you to go home from the hospital sooner and continue treatment at home. The PICC is inserted using a germ-free (sterile) technique by a specially trained health care provider. Only a trained health care provider should remove it. Do not have your blood pressure checked on the arm in which your PICC is placed. Always keep your PICC identification card with you. This information is not intended to replace advice given to you by your health care provider. Make sure you discuss any questions you have with your health care provider. Document Revised: 07/18/2020 Document Reviewed: 07/18/2020 Elsevier Patient Education  2023 Elsevier Inc.  

## 2021-05-06 NOTE — Patient Instructions (Signed)
[facility]  Discharge Instructions: ?Thank you for choosing [facility] to provide your oncology and hematology care.  ? ?If you have a lab appointment with the [facility], please go directly to the [facility] and check in at the registration area. ?  ?Wear comfortable clothing and clothing appropriate for easy access to any Portacath or PICC line.  ? ?We strive to give you quality time with your provider. You may need to reschedule your appointment if you arrive late (15 or more minutes).  Arriving late affects you and other patients whose appointments are after yours.  Also, if you miss three or more appointments without notifying the office, you may be dismissed from the clinic at the provider?s discretion.    ?  ?For prescription refill requests, have your pharmacy contact our office and allow 72 hours for refills to be completed.   ? ?Today you received the following chemotherapy and/or immunotherapy agents Vectibix, Fofiri    ?  ?To help prevent nausea and vomiting after your treatment, we encourage you to take your nausea medication as directed. ? ?BELOW ARE SYMPTOMS THAT SHOULD BE REPORTED IMMEDIATELY: ?*FEVER GREATER THAN 100.4 F (38 ?C) OR HIGHER ?*CHILLS OR SWEATING ?*NAUSEA AND VOMITING THAT IS NOT CONTROLLED WITH YOUR NAUSEA MEDICATION ?*UNUSUAL SHORTNESS OF BREATH ?*UNUSUAL BRUISING OR BLEEDING ?*URINARY PROBLEMS (pain or burning when urinating, or frequent urination) ?*BOWEL PROBLEMS (unusual diarrhea, constipation, pain near the anus) ?TENDERNESS IN MOUTH AND THROAT WITH OR WITHOUT PRESENCE OF ULCERS (sore throat, sores in mouth, or a toothache) ?UNUSUAL RASH, SWELLING OR PAIN  ?UNUSUAL VAGINAL DISCHARGE OR ITCHING  ? ?Items with * indicate a potential emergency and should be followed up as soon as possible or go to the Emergency Department if any problems should occur. ? ?Please show the CHEMOTHERAPY ALERT CARD or IMMUNOTHERAPY ALERT CARD at check-in  to the Emergency Department and triage nurse. ? ?Should you have questions after your visit or need to cancel or reschedule your appointment, please contact [facility]  Dept: [phone]  and follow the prompts.  Office hours are 8:00 a.m. to 4:30 p.m. Monday - Friday. Please note that voicemails left after 4:00 p.m. may not be returned until the following business day.  We are closed weekends and major holidays. You have access to a nurse at all times for urgent questions. Please call the main number to the clinic Dept: [phone] and follow the prompts. ? ? ?For any non-urgent questions, you may also contact your provider using MyChart. We now offer e-Visits for anyone 26 and older to request care online for non-urgent symptoms. For details visit [email or web address]. ?  ?Also download the MyChart app! Go to the app store, search "MyChart", open the app, select [facility], and log in with your MyChart username and password. ? ?Due to Covid, a mask is required upon entering the hospital/clinic. If you do not have a mask, one will be given to you upon arrival. For doctor visits, patients may have 1 support person aged 48 or older with them. For treatment visits, patients cannot have anyone with them due to current Covid guidelines and our immunocompromised population.  ? ?

## 2021-05-06 NOTE — Progress Notes (Signed)
Patient presents for treatment. RN assessment completed along with the following: ? ?Labs/vitals reviewed - Yes, and within treatment parameters.   ?Weight within 10% of previous measurement - Yes ?Informed consent completed and reflects current therapy/intent - Yes, on date 08/19/20             ?Provider progress note reviewed - Yes, today's provider note was reviewed. ?Treatment/Antibody/Supportive plan reviewed - Yes, and there are no adjustments needed for today's treatment. ?S&H and other orders reviewed - Yes, and there are no additional orders identified. ?Previous treatment date reviewed - Yes, and the appropriate amount of time has elapsed between treatments. ?Clinic Hand Off Received from - Merceda Elks, RN ? ?Patient to proceed with treatment.   ?

## 2021-05-06 NOTE — Progress Notes (Signed)
?[facility] ?OFFICE PROGRESS NOTE ? ? ?Diagnosis: Colon cancer ? ?INTERVAL HISTORY:  ? ?April [name] returns as scheduled.  Complete another cycle of FOLFIRI/panitumumab on 04/15/2021.  April [name] reports nausea for approximately 3 days following chemotherapy.  April [name] had dyspnea while on the 5-FU pump.  No mouth sores or diarrhea.  April [name] has a persistent skin rash.  April [name] noted increased growth of eyelashes. ? ?Objective: ? ?Vital signs in last 24 hours: ? ?Blood pressure (!) 150/84, pulse 71, temperature 98.1 ?F (36.7 ?C), temperature source Oral, resp. rate 18, height _0  (1.651 m), weight 168 lb 9.6 oz (76.5 kg), last menstrual period 01/07/2006, SpO2 100 %. ?  ? ?HEENT: No thrush or ulcers ?Resp: Lungs clear bilaterally ?Cardio: Regular rate and rhythm ?GI: No hepatosplenomegaly, nontender ?Vascular: No leg edema  ?Skin: Mild acne type rash over the face, mild hyperpigmentation of the hands ? ?Portacath/PICC-without erythema ? ?Lab Results: ? ?Lab Results  ?Component Value Date  ? WBC 5.3 05/06/2021  ? HGB 14.1 05/06/2021  ? HCT 43.1 05/06/2021  ? MCV 94.1 05/06/2021  ? PLT 264 05/06/2021  ? NEUTROABS 1.8 05/06/2021  ? ? ?CMP  ?Lab Results  ?Component Value Date  ? NA 138 05/06/2021  ? K 3.5 05/06/2021  ? CL 101 05/06/2021  ? CO2 27 05/06/2021  ? GLUCOSE 97 05/06/2021  ? BUN 9 05/06/2021  ? CREATININE 0.61 05/06/2021  ? CALCIUM 10.4 (H) 05/06/2021  ? PROT 7.6 05/06/2021  ? ALBUMIN 4.5 05/06/2021  ? AST 15 05/06/2021  ? ALT 12 05/06/2021  ? ALKPHOS 140 (H) 05/06/2021  ? BILITOT 0.3 05/06/2021  ? GFRNONAA >60 05/06/2021  ? GFRAA >60 10/17/2019  ? ? ?Lab Results  ?Component Value Date  ? CEA1 8.66 (H) 07/02/2020  ? CEA 2.23 05/06/2021  ? ? ?Medications: I have reviewed the patient's current medications. ? ? ?Assessment/Plan: ?Sigmoid colon cancer, stage IV (pT4a,pN2b,M1c) ?Colonoscopy 03/24/2019-3 rectal polyps-hyperplastic polyps, distal colon biopsy-at least intramucosal adenocarcinoma, completely obstructing  mass in the distal sigmoid colon, could not be traversed, intact mismatch repair protein expression ?03/24/2019-CEA 56.1 ?03/29/2019 CT abdomen/pelvis-circumferential thickening involving the entire mid and distal sigmoid colon to the level of the rectosigmoid junction, solid/cystic mass in the left ovary, bilateral nephrolithiasis ?Robotic assisted low anterior resection, mesenteric lymphadenectomy, bilateral salpingo-oophorectomy 04/08/2019 ?Pathology ([facility] review of outside pathology) metastatic adenocarcinoma involving the peritoneum overlying the round ligament, serosa of the urinary bladder, serosa of the right ureter a sacral area, pelvic peritoneum, and left ovary.  Omental biopsy with focal mucin pools with no carcinoma cells identified, right hemidiaphragm biopsy involved by metastatic adenocarcinoma, invasive adenocarcinoma the sigmoid colon, moderately differentiated, T4a, perineural and vascular invasion present, 7/22 lymph nodes, multiple tumor deposits, resection margins negative ?Negative for PD-L1, low probability of MSI-high, HER-2 negative, negative for BRAF, NRAS and KRAS alterations ?CTs 05/19/2019-no evidence of metastatic disease, findings suspicious for colitis of the transverse and ascending colon, small amount of ascites in the cul-de-sac, bilateral renal calculi ?Cycle 1 FOLFIRI 05/24/2019, bevacizumab added with cycle 2 ?Cycle 4 FOLFIRI/bevacizumab 07/05/2019 ?Cycle 5 FOLFIRI 07/27/2019, bevacizumab held secondary to hypertension ?Cycle 6 FOLFIRI 08/10/1999, bevacizumab held secondary to hypertension ?CTs 08/30/2019-no evidence of recurrent disease, new subsolid right lower lobe nodule felt to be inflammatory, emphysema ?Cycle 7 FOLFIRI 09/26/2019, bevacizumab remains on hold secondary to hypertension ?Cycle 8 FOLFIRI 10/17/2019, bevacizumab held secondary to hypertension, Udenyca added for neutropenia ?Cycle 9 FOLFIRI 11/07/2019, bevacizumab held secondary to hypertension, Udenyca ?Cycle 10 FOLFIRI  11/28/2019, bevacizumab held, [place] ?Cycle 11 FOLFIRI 12/26/2019, bevacizumab held, [place] ?CTs 01/25/2020-no evidence of recurrent disease, resolution of right lower lobe nodule ?Maintenance Xeloda beginning 02/06/2020 ?CTs 04/25/2020- no evidence of metastatic disease, multiple bilateral renal calculi without hydronephrosis ?Maintenance Xeloda continued ?CT abdomen/pelvis without contrast 08/10/2020-bilateral staghorn renal calculi, no evidence of metastatic disease; addendum 08/23/2020-small but increasing omental nodules. ?Cycle 1 FOLFIRI/panitumumab 09/19/2020 ?Cycle 2 FOLFIRI/Panitumumab 10/03/2020 ?Cycle 3 FOLFIRI/Panitumumab 10/17/2020 ?Cycle 4 FOLFIRI/panitumumab 10/31/2020 ?Cycle 5 FOLFIRI/Panitumumab 11/14/2020 ?CTs 11/26/2020-stable omental metastases compared to 10/22/2020, mildly decreased from 08/10/2020.  Left lower quadrant tiny paracolic gutter implant slightly decreased from CT 08/10/2020.  No new or progressive metastatic disease in the abdomen or pelvis. ?Cycle 6 FOLFIRI/Panitumumab 11/28/2020, irinotecan dose reduced, treatment schedule adjusted to every 3 weeks going forward ?Cycle 7 FOLFIRI/panitumumab 12/24/2020 ?Cycle 8 FOLFIRI/Panitumumab 01/16/2021--treatment held due to hypertension in the infusion area. ?Cycle 8 FOLFIRI/panitumumab 01/21/2021 ?Cycle 9 FOLFIRI/panitumumab 02/11/2021 ?Cycle 10 FOLFIRI/Panitumumab 03/04/2021 ?Cycle 11 FOLFIRI/Panitumumab 03/25/2021 ?CT abdomen/pelvis 04/10/2021-stable right omental and left posterior paracolic gutter nodules ?Cycle 12 FOLFIRI/panitumumab 04/15/2021 ?Cycle 13 FOLFIRI/panitumumab 05/06/2021 ?  ?Hypertension ?G4 P3, twins ?Kidney stones-bilateral staghorn renal calculi on CT 08/10/2020 ?Infected Port-A-Cath 09/12/2019-placed on Augmentin, referred for Port-A-Cath removal; Port-A-Cath removed 09/14/2019; PICC line placed 09/14/2019; culture staph aureus.  Course of Septra completed. ?Neutropenia secondary to chemotherapy-Udenyca added with cycle 8 FOLFIRI ?Right  nephrostomy tube 09/12/2020; stent placed 10/09/2020, percutaneous nephrostolithotomy treatment of right-sided kidney stones 10/09/2020, malpositioned right ureter stent replacement 10/23/2020, right ureter stent removed 10/29/2020 ?  ? ? ? ?Disposition: ?April [name] appears stable.  April [name] will complete another cycle of FOLFIRI/panitumumab today.  April [name] will return for an office visit and chemotherapy in 3 weeks.  April [name] will take home Decadron prophylaxis for delayed nausea.  April [name] continues doxycycline. ? ?Betsy Coder, MD ? ?05/06/2021  ?9:51 AM ? ? ?

## 2021-05-06 NOTE — Progress Notes (Signed)
Patient seen by Dr. Sherrill today ? ?Vitals are within treatment parameters. ? ?Labs reviewed by Dr. Sherrill and are within treatment parameters. ? ?Per physician team, patient is ready for treatment and there are NO modifications to the treatment plan.  ?

## 2021-05-08 ENCOUNTER — Inpatient Hospital Stay: Payer: Medicaid Other

## 2021-05-08 VITALS — BP 126/98 | HR 80 | Temp 98.7°F | Resp 18

## 2021-05-08 DIAGNOSIS — Z95828 Presence of other vascular implants and grafts: Secondary | ICD-10-CM

## 2021-05-08 DIAGNOSIS — C20 Malignant neoplasm of rectum: Secondary | ICD-10-CM

## 2021-05-08 DIAGNOSIS — Z5111 Encounter for antineoplastic chemotherapy: Secondary | ICD-10-CM | POA: Diagnosis not present

## 2021-05-08 MED ORDER — HEPARIN SOD (PORK) LOCK FLUSH 100 UNIT/ML IV SOLN
500.0000 [IU] | Freq: Once | INTRAVENOUS | Status: AC
  Administered 2021-05-08: 500 [IU]

## 2021-05-08 MED ORDER — SODIUM CHLORIDE 0.9% FLUSH
10.0000 mL | Freq: Once | INTRAVENOUS | Status: AC
  Administered 2021-05-08: 10 mL

## 2021-05-09 ENCOUNTER — Other Ambulatory Visit (HOSPITAL_BASED_OUTPATIENT_CLINIC_OR_DEPARTMENT_OTHER): Payer: Self-pay

## 2021-05-13 ENCOUNTER — Ambulatory Visit (HOSPITAL_BASED_OUTPATIENT_CLINIC_OR_DEPARTMENT_OTHER): Payer: Medicaid Other | Admitting: Nurse Practitioner

## 2021-05-13 ENCOUNTER — Inpatient Hospital Stay: Payer: Medicaid Other

## 2021-05-13 NOTE — Progress Notes (Deleted)
   Established Patient Office Visit  Subjective   Patient ID: April [name], female    DOB: Dec 09, 1975  Age: 46 y.o. MRN: [id number]  No chief complaint on file.   HPI HYPERTENSION Hypertension status: {Blank single:19197::"controlled","uncontrolled","better","worse","exacerbated","stable"}  Satisfied with current treatment? {Blank single:19197::"yes","no"} Duration of hypertension: {Blank single:19197::"chronic","months","years"} BP monitoring frequency:  {Blank single:19197::"not checking","rarely","daily","weekly","monthly","a few times a day","a few times a week","a few times a month"} BP range:  BP medication side effects:  {Blank single:19197::"yes","no"} Medication compliance: {Blank single:19197::"excellent compliance","good compliance","fair compliance","poor compliance"} Aspirin: {Blank single:19197::"yes","no"} Recurrent headaches: {Blank single:19197::"yes","no"} Visual changes: {Blank single:19197::"yes","no"} Palpitations: {Blank single:19197::"yes","no"} Dyspnea: {Blank single:19197::"yes","no"} Chest pain: {Blank single:19197::"yes","no"} Lower extremity edema: {Blank single:19197::"yes","no"} Dizzy/lightheaded: {Blank single:19197::"yes","no"}     ROS    Objective:     LMP 01/07/2006  {Vitals History (Optional):23777}  Physical Exam   No results found for any visits on 05/13/21.  {Labs (Optional):23779}  The ASCVD Risk score (Arnett DK, et al., 2019) failed to calculate for the following reasons:   Cannot find a previous HDL lab   Cannot find a previous total cholesterol lab    Assessment & Plan:   Problem List Items Addressed This Visit     Hypertension - Primary   Hypokalemia   Hyperglycemia    No follow-ups on file.    Orma Render, NP

## 2021-05-14 ENCOUNTER — Other Ambulatory Visit (HOSPITAL_BASED_OUTPATIENT_CLINIC_OR_DEPARTMENT_OTHER): Payer: Self-pay

## 2021-05-14 ENCOUNTER — Inpatient Hospital Stay: Payer: Medicaid Other | Attending: Oncology

## 2021-05-14 VITALS — BP 131/98 | HR 98 | Temp 98.2°F | Resp 19

## 2021-05-14 DIAGNOSIS — Z79899 Other long term (current) drug therapy: Secondary | ICD-10-CM | POA: Diagnosis not present

## 2021-05-14 DIAGNOSIS — C786 Secondary malignant neoplasm of retroperitoneum and peritoneum: Secondary | ICD-10-CM | POA: Diagnosis not present

## 2021-05-14 DIAGNOSIS — C20 Malignant neoplasm of rectum: Secondary | ICD-10-CM

## 2021-05-14 DIAGNOSIS — C187 Malignant neoplasm of sigmoid colon: Secondary | ICD-10-CM | POA: Diagnosis present

## 2021-05-14 DIAGNOSIS — C7962 Secondary malignant neoplasm of left ovary: Secondary | ICD-10-CM | POA: Insufficient documentation

## 2021-05-14 DIAGNOSIS — Z5111 Encounter for antineoplastic chemotherapy: Secondary | ICD-10-CM | POA: Insufficient documentation

## 2021-05-14 DIAGNOSIS — Z95828 Presence of other vascular implants and grafts: Secondary | ICD-10-CM

## 2021-05-14 DIAGNOSIS — Z452 Encounter for adjustment and management of vascular access device: Secondary | ICD-10-CM | POA: Insufficient documentation

## 2021-05-14 DIAGNOSIS — C7989 Secondary malignant neoplasm of other specified sites: Secondary | ICD-10-CM | POA: Diagnosis not present

## 2021-05-14 MED ORDER — SODIUM CHLORIDE 0.9% FLUSH
10.0000 mL | Freq: Once | INTRAVENOUS | Status: AC
  Administered 2021-05-14: 10 mL

## 2021-05-14 MED ORDER — HEPARIN SOD (PORK) LOCK FLUSH 100 UNIT/ML IV SOLN
500.0000 [IU] | Freq: Once | INTRAVENOUS | Status: AC
  Administered 2021-05-14: 500 [IU]

## 2021-05-16 ENCOUNTER — Other Ambulatory Visit (HOSPITAL_BASED_OUTPATIENT_CLINIC_OR_DEPARTMENT_OTHER): Payer: Self-pay

## 2021-05-17 ENCOUNTER — Other Ambulatory Visit (HOSPITAL_BASED_OUTPATIENT_CLINIC_OR_DEPARTMENT_OTHER): Payer: Self-pay

## 2021-05-20 ENCOUNTER — Inpatient Hospital Stay: Payer: Medicaid Other

## 2021-05-20 ENCOUNTER — Other Ambulatory Visit (HOSPITAL_BASED_OUTPATIENT_CLINIC_OR_DEPARTMENT_OTHER): Payer: Self-pay

## 2021-05-20 ENCOUNTER — Encounter (HOSPITAL_BASED_OUTPATIENT_CLINIC_OR_DEPARTMENT_OTHER): Payer: Self-pay | Admitting: Pharmacist

## 2021-05-20 VITALS — BP 160/96 | HR 82 | Resp 20

## 2021-05-20 DIAGNOSIS — Z5111 Encounter for antineoplastic chemotherapy: Secondary | ICD-10-CM | POA: Diagnosis not present

## 2021-05-20 DIAGNOSIS — Z95828 Presence of other vascular implants and grafts: Secondary | ICD-10-CM

## 2021-05-20 DIAGNOSIS — C20 Malignant neoplasm of rectum: Secondary | ICD-10-CM

## 2021-05-20 MED ORDER — SODIUM CHLORIDE 0.9% FLUSH
10.0000 mL | Freq: Once | INTRAVENOUS | Status: AC
  Administered 2021-05-20: 10 mL

## 2021-05-20 MED ORDER — HEPARIN SOD (PORK) LOCK FLUSH 100 UNIT/ML IV SOLN
500.0000 [IU] | Freq: Once | INTRAVENOUS | Status: AC
  Administered 2021-05-20: 500 [IU]

## 2021-05-20 NOTE — Patient Instructions (Signed)
PICC Home Care Guide A peripherally inserted central catheter (PICC) is a form of IV access that allows medicines and IV fluids to be quickly put into the blood and spread throughout the body. The PICC is a long, thin, flexible tube (catheter) that is put into a vein in a person's arm or leg. The catheter ends in a large vein just outside the heart called the superior vena cava (SVC). After the PICC is put in, a chest X-ray may be done to make sure that it is in the right place. A PICC may be placed for different reasons, such as: To give medicines and liquid nutrition. To give IV fluids and blood products. To take blood samples often. If there is trouble placing a peripheral intravenous (PIV) catheter. If cared for properly, a PICC can remain in place for many months. Having a PICC can allow you to go home from the hospital sooner and continue treatment at home. Medicines and PICC care can be managed at home by a family member, caregiver, or home health care team. What are the risks? Generally, having a PICC is safe. However, problems may occur, including: A blood clot (thrombus) forming in or at the end of the PICC. A blood clot forming in a vein (deep vein thrombosis) or traveling to the lung (pulmonary embolism). Inflammation of the vein (phlebitis) in which the PICC is placed. Infection at the insertion site or in the blood. Blood infections from central lines, like PICCs, can be serious and often require a hospital stay. PICC malposition, or PICC movement or poor placement. A break or cut in the PICC. Do not use scissors near the PICC. Nerve or tendon irritation or injury during PICC insertion. How to care for your PICC Please follow the specific guidelines provided by your health care provider. Preventing infection You and any caregivers should wash your hands often with soap and water for at least 20 seconds. Wash hands: Before touching the PICC or the infusion device. Before changing a  bandage (dressing). Do not change the dressing unless you have been taught to do so and have shown you are able to change it safely. Flush the PICC as told. Tell your health care provider right away if the PICC is hard to flush or does not flush. Do not use force to flush the PICC. Use clean and germ-free (sterile) supplies only. Keep the supplies in a dry place. Do not reuse needles, syringes, or any other supplies. Reusing supplies can lead to infection. Keep the PICC dressing dry and secure it with tape if the edges stop sticking to your skin. Check your PICC insertion site every day for signs of infection. Check for: Redness, swelling, or pain. Fluid or blood. Warmth. Pus or a bad smell. Preventing other problems Do not use a syringe that is less than 10 mL to flush the PICC. Do not have your blood pressure checked on the arm in which the PICC is placed. Do not ever pull or tug on the PICC. Keep it secured to your arm with tape or a stretch wrap when not in use. Do not take the PICC out yourself. Only a trained health care provider should remove the PICC. Keep pets and children away from your PICC. How to care for your PICC dressing Keep your PICC dressing clean and dry to prevent infection. Do not take baths, swim, or use a hot tub until your health care provider approves. Ask your health care provider if you can take   showers. You may only be allowed to take sponge baths. When you are allowed to shower: Ask your health care provider to teach you how to wrap the PICC. Cover the PICC with clear plastic wrap and tape to keep it dry while showering. Follow instructions from your health care provider about how to take care of your insertion site and dressing. Make sure you: Wash your hands with soap and water for at least 20 seconds before and after you change your dressing. If soap and water are not available, use hand sanitizer. Change your dressing only if taught to do so by your health care  provider. Your PICC dressing needs to be changed if it becomes loose or wet. Leave stitches (sutures), skin glue, or adhesive strips in place. These skin closures may need to stay in place for 2 weeks or longer. If adhesive strip edges start to loosen and curl up, you may trim the loose edges. Do not remove adhesive strips completely unless your health care provider tells you to do that. Follow these instructions at home: Disposal of supplies Throw away any syringes in a disposal container that is meant for sharp items (sharps container). You can buy a sharps container from a pharmacy, or you can make one by using an empty, hard plastic bottle with a lid. Place any used dressings or infusion bags into a plastic bag. Throw that bag in the trash. General instructions  Always carry your PICC identification card or wear a medical alert bracelet. Keep the tube clamped at all times, unless it is being used. Always carry a smooth-edge clamp with you to clamp the PICC if it breaks. Do not use scissors or sharp objects near the tube. You may bend your arm and move it freely. If your PICC is near or at the bend of your elbow, avoid activity with repeated motion at the elbow. Avoid lifting heavy objects as told by your health care provider. Keep all follow-up visits. This is important. You will need to have your PICC dressing changed at least once a week. Contact a health care provider if: You have pain in your arm, ear, face, or teeth. You have a fever or chills. You have redness, swelling, or pain around the insertion site. You have fluid or blood coming from the insertion site. Your insertion site feels warm to the touch. You have pus or a bad smell coming from the insertion site. Your skin feels hard and raised around the insertion site. Your PICC dressing has gotten wet or is coming off and you have not been taught how to change it. Get help right away if: You have problems with your PICC, such as  your PICC: Was tugged or pulled and has partially come out. Do not  push the PICC back in. Cannot be flushed, is hard to flush, or leaks around the insertion site when it is flushed. Makes a flushing sound when it is flushed. Appears to have a hole or tear. Is accidentally pulled all the way out. If this happens, cover the insertion site with a gauze dressing. Do not throw the PICC away. Your health care provider will need to check it to be sure the entire catheter came out. You feel your heart racing or skipping beats, or you have chest pain. You have shortness of breath or trouble breathing. You have swelling, redness, warmth, or pain in the arm in which the PICC is placed. You have a red streak going up your arm that   starts under the PICC dressing. These symptoms may be an emergency. Get help right away. Call 911. Do not wait to see if the symptoms will go away. Do not drive yourself to the hospital. Summary A peripherally inserted central catheter (PICC) is a long, thin, flexible tube (catheter) that is put into a vein in the arm or leg. If cared for properly, a PICC can remain in place for many months. Having a PICC can allow you to go home from the hospital sooner and continue treatment at home. The PICC is inserted using a germ-free (sterile) technique by a specially trained health care provider. Only a trained health care provider should remove it. Do not have your blood pressure checked on the arm in which your PICC is placed. Always keep your PICC identification card with you. This information is not intended to replace advice given to you by your health care provider. Make sure you discuss any questions you have with your health care provider. Document Revised: 07/18/2020 Document Reviewed: 07/18/2020 Elsevier Patient Education  2023 Elsevier Inc.  

## 2021-05-26 ENCOUNTER — Other Ambulatory Visit: Payer: Self-pay | Admitting: Oncology

## 2021-05-27 ENCOUNTER — Encounter: Payer: Self-pay | Admitting: Nurse Practitioner

## 2021-05-27 ENCOUNTER — Inpatient Hospital Stay: Payer: Medicaid Other

## 2021-05-27 ENCOUNTER — Inpatient Hospital Stay (HOSPITAL_BASED_OUTPATIENT_CLINIC_OR_DEPARTMENT_OTHER): Payer: Medicaid Other | Admitting: Nurse Practitioner

## 2021-05-27 ENCOUNTER — Other Ambulatory Visit (HOSPITAL_BASED_OUTPATIENT_CLINIC_OR_DEPARTMENT_OTHER): Payer: Self-pay

## 2021-05-27 VITALS — BP 130/90 | HR 87 | Temp 98.2°F | Resp 18 | Ht 65.0 in | Wt 166.6 lb

## 2021-05-27 DIAGNOSIS — C20 Malignant neoplasm of rectum: Secondary | ICD-10-CM

## 2021-05-27 DIAGNOSIS — Z5111 Encounter for antineoplastic chemotherapy: Secondary | ICD-10-CM | POA: Diagnosis not present

## 2021-05-27 DIAGNOSIS — C19 Malignant neoplasm of rectosigmoid junction: Secondary | ICD-10-CM | POA: Diagnosis not present

## 2021-05-27 DIAGNOSIS — Z452 Encounter for adjustment and management of vascular access device: Secondary | ICD-10-CM

## 2021-05-27 LAB — CMP (CANCER CENTER ONLY)
ALT: 8 U/L (ref 0–44)
AST: 12 U/L — ABNORMAL LOW (ref 15–41)
Albumin: 4.6 g/dL (ref 3.5–5.0)
Alkaline Phosphatase: 145 U/L — ABNORMAL HIGH (ref 38–126)
Anion gap: 10 (ref 5–15)
BUN: 14 mg/dL (ref 6–20)
CO2: 29 mmol/L (ref 22–32)
Calcium: 10 mg/dL (ref 8.9–10.3)
Chloride: 100 mmol/L (ref 98–111)
Creatinine: 0.72 mg/dL (ref 0.44–1.00)
GFR, Estimated: >60 mL/min (ref >60)
Glucose, Bld: 133 mg/dL — ABNORMAL HIGH (ref 70–99)
Potassium: 3.7 mmol/L (ref 3.5–5.1)
Sodium: 139 mmol/L (ref 135–145)
Total Bilirubin: 0.2 mg/dL — ABNORMAL LOW (ref 0.3–1.2)
Total Protein: 8 g/dL (ref 6.5–8.1)

## 2021-05-27 LAB — CBC WITH DIFFERENTIAL (CANCER CENTER ONLY)
Abs Immature Granulocytes: 0.01 10*3/uL (ref 0.00–0.07)
Basophils Absolute: 0 10*3/uL (ref 0.0–0.1)
Basophils Relative: 0 %
Eosinophils Absolute: 0.1 10*3/uL (ref 0.0–0.5)
Eosinophils Relative: 1 %
HCT: 40.5 % (ref 36.0–46.0)
Hemoglobin: 13.5 g/dL (ref 12.0–15.0)
Immature Granulocytes: 0 %
Lymphocytes Relative: 35 %
Lymphs Abs: 2.8 10*3/uL (ref 0.7–4.0)
MCH: 31.7 pg (ref 26.0–34.0)
MCHC: 33.3 g/dL (ref 30.0–36.0)
MCV: 95.1 fL (ref 80.0–100.0)
Monocytes Absolute: 0.7 10*3/uL (ref 0.1–1.0)
Monocytes Relative: 8 %
Neutro Abs: 4.4 10*3/uL (ref 1.7–7.7)
Neutrophils Relative %: 56 %
Platelet Count: 239 10*3/uL (ref 150–400)
RBC: 4.26 MIL/uL (ref 3.87–5.11)
RDW: 14.6 % (ref 11.5–15.5)
WBC Count: 7.9 10*3/uL (ref 4.0–10.5)
nRBC: 0 % (ref 0.0–0.2)

## 2021-05-27 LAB — CEA (ACCESS): CEA (CHCC): 2.13 ng/mL (ref 0.00–5.00)

## 2021-05-27 LAB — MAGNESIUM: Magnesium: 1.9 mg/dL (ref 1.7–2.4)

## 2021-05-27 MED ORDER — SODIUM CHLORIDE 0.9 % IV SOLN
10.0000 mg | Freq: Once | INTRAVENOUS | Status: AC
  Administered 2021-05-27: 10 mg via INTRAVENOUS
  Filled 2021-05-27: qty 1

## 2021-05-27 MED ORDER — DOXYCYCLINE HYCLATE 100 MG PO CAPS
100.0000 mg | ORAL_CAPSULE | Freq: Two times a day (BID) | ORAL | 2 refills | Status: DC
  Filled 2021-05-27: qty 60, 30d supply, fill #0

## 2021-05-27 MED ORDER — DEXAMETHASONE 4 MG PO TABS
4.0000 mg | ORAL_TABLET | Freq: Two times a day (BID) | ORAL | 0 refills | Status: DC
  Filled 2021-05-27: qty 12, 6d supply, fill #0

## 2021-05-27 MED ORDER — SODIUM CHLORIDE 0.9 % IV SOLN
125.0000 mg/m2 | Freq: Once | INTRAVENOUS | Status: AC
  Administered 2021-05-27: 240 mg via INTRAVENOUS
  Filled 2021-05-27: qty 10

## 2021-05-27 MED ORDER — SODIUM CHLORIDE 0.9 % IV SOLN
150.0000 mg | Freq: Once | INTRAVENOUS | Status: AC
  Administered 2021-05-27: 150 mg via INTRAVENOUS
  Filled 2021-05-27: qty 5

## 2021-05-27 MED ORDER — ATROPINE SULFATE 1 MG/ML IV SOLN
0.5000 mg | Freq: Once | INTRAVENOUS | Status: AC
  Administered 2021-05-27: 0.5 mg via INTRAVENOUS
  Filled 2021-05-27: qty 1

## 2021-05-27 MED ORDER — PALONOSETRON HCL INJECTION 0.25 MG/5ML
0.2500 mg | Freq: Once | INTRAVENOUS | Status: AC
  Administered 2021-05-27: 0.25 mg via INTRAVENOUS
  Filled 2021-05-27: qty 5

## 2021-05-27 MED ORDER — SODIUM CHLORIDE 0.9 % IV SOLN: Freq: Once | INTRAVENOUS | Status: AC

## 2021-05-27 MED ORDER — ALTEPLASE 2 MG IJ SOLR
2.0000 mg | Freq: Once | INTRAMUSCULAR | Status: AC
  Administered 2021-05-27: 2 mg
  Filled 2021-05-27: qty 2

## 2021-05-27 MED ORDER — FLUOROURACIL CHEMO INJECTION 500 MG/10ML
200.0000 mg/m2 | Freq: Once | INTRAVENOUS | Status: AC
  Administered 2021-05-27: 350 mg via INTRAVENOUS
  Filled 2021-05-27: qty 7

## 2021-05-27 MED ORDER — SODIUM CHLORIDE 0.9 % IV SOLN
1800.0000 mg/m2 | INTRAVENOUS | Status: DC
  Administered 2021-05-27: 3350 mg via INTRAVENOUS
  Filled 2021-05-27: qty 67

## 2021-05-27 MED ORDER — SODIUM CHLORIDE 0.9 % IV SOLN
6.0000 mg/kg | Freq: Once | INTRAVENOUS | Status: AC
  Administered 2021-05-27: 440 mg via INTRAVENOUS
  Filled 2021-05-27: qty 20

## 2021-05-27 MED ORDER — SODIUM CHLORIDE 0.9 % IV SOLN
200.0000 mg/m2 | Freq: Once | INTRAVENOUS | Status: AC
  Administered 2021-05-27: 372 mg via INTRAVENOUS
  Filled 2021-05-27: qty 18.6

## 2021-05-27 NOTE — Patient Instructions (Signed)
PICC Home Care Guide A peripherally inserted central catheter (PICC) is a form of IV access that allows medicines and IV fluids to be quickly put into the blood and spread throughout the body. The PICC is a long, thin, flexible tube (catheter) that is put into a vein in a person's arm or leg. The catheter ends in a large vein just outside the heart called the superior vena cava (SVC). After the PICC is put in, a chest X-ray may be done to make sure that it is in the right place. A PICC may be placed for different reasons, such as: To give medicines and liquid nutrition. To give IV fluids and blood products. To take blood samples often. If there is trouble placing a peripheral intravenous (PIV) catheter. If cared for properly, a PICC can remain in place for many months. Having a PICC can allow you to go home from the hospital sooner and continue treatment at home. Medicines and PICC care can be managed at home by a family member, caregiver, or home health care team. What are the risks? Generally, having a PICC is safe. However, problems may occur, including: A blood clot (thrombus) forming in or at the end of the PICC. A blood clot forming in a vein (deep vein thrombosis) or traveling to the lung (pulmonary embolism). Inflammation of the vein (phlebitis) in which the PICC is placed. Infection at the insertion site or in the blood. Blood infections from central lines, like PICCs, can be serious and often require a hospital stay. PICC malposition, or PICC movement or poor placement. A break or cut in the PICC. Do not use scissors near the PICC. Nerve or tendon irritation or injury during PICC insertion. How to care for your PICC Please follow the specific guidelines provided by your health care provider. Preventing infection You and any caregivers should wash your hands often with soap and water for at least 20 seconds. Wash hands: Before touching the PICC or the infusion device. Before changing a  bandage (dressing). Do not change the dressing unless you have been taught to do so and have shown you are able to change it safely. Flush the PICC as told. Tell your health care provider right away if the PICC is hard to flush or does not flush. Do not use force to flush the PICC. Use clean and germ-free (sterile) supplies only. Keep the supplies in a dry place. Do not reuse needles, syringes, or any other supplies. Reusing supplies can lead to infection. Keep the PICC dressing dry and secure it with tape if the edges stop sticking to your skin. Check your PICC insertion site every day for signs of infection. Check for: Redness, swelling, or pain. Fluid or blood. Warmth. Pus or a bad smell. Preventing other problems Do not use a syringe that is less than 10 mL to flush the PICC. Do not have your blood pressure checked on the arm in which the PICC is placed. Do not ever pull or tug on the PICC. Keep it secured to your arm with tape or a stretch wrap when not in use. Do not take the PICC out yourself. Only a trained health care provider should remove the PICC. Keep pets and children away from your PICC. How to care for your PICC dressing Keep your PICC dressing clean and dry to prevent infection. Do not take baths, swim, or use a hot tub until your health care provider approves. Ask your health care provider if you can take   showers. You may only be allowed to take sponge baths. When you are allowed to shower: Ask your health care provider to teach you how to wrap the PICC. Cover the PICC with clear plastic wrap and tape to keep it dry while showering. Follow instructions from your health care provider about how to take care of your insertion site and dressing. Make sure you: Wash your hands with soap and water for at least 20 seconds before and after you change your dressing. If soap and water are not available, use hand sanitizer. Change your dressing only if taught to do so by your health care  provider. Your PICC dressing needs to be changed if it becomes loose or wet. Leave stitches (sutures), skin glue, or adhesive strips in place. These skin closures may need to stay in place for 2 weeks or longer. If adhesive strip edges start to loosen and curl up, you may trim the loose edges. Do not remove adhesive strips completely unless your health care provider tells you to do that. Follow these instructions at home: Disposal of supplies Throw away any syringes in a disposal container that is meant for sharp items (sharps container). You can buy a sharps container from a pharmacy, or you can make one by using an empty, hard plastic bottle with a lid. Place any used dressings or infusion bags into a plastic bag. Throw that bag in the trash. General instructions  Always carry your PICC identification card or wear a medical alert bracelet. Keep the tube clamped at all times, unless it is being used. Always carry a smooth-edge clamp with you to clamp the PICC if it breaks. Do not use scissors or sharp objects near the tube. You may bend your arm and move it freely. If your PICC is near or at the bend of your elbow, avoid activity with repeated motion at the elbow. Avoid lifting heavy objects as told by your health care provider. Keep all follow-up visits. This is important. You will need to have your PICC dressing changed at least once a week. Contact a health care provider if: You have pain in your arm, ear, face, or teeth. You have a fever or chills. You have redness, swelling, or pain around the insertion site. You have fluid or blood coming from the insertion site. Your insertion site feels warm to the touch. You have pus or a bad smell coming from the insertion site. Your skin feels hard and raised around the insertion site. Your PICC dressing has gotten wet or is coming off and you have not been taught how to change it. Get help right away if: You have problems with your PICC, such as  your PICC: Was tugged or pulled and has partially come out. Do not  push the PICC back in. Cannot be flushed, is hard to flush, or leaks around the insertion site when it is flushed. Makes a flushing sound when it is flushed. Appears to have a hole or tear. Is accidentally pulled all the way out. If this happens, cover the insertion site with a gauze dressing. Do not throw the PICC away. Your health care provider will need to check it to be sure the entire catheter came out. You feel your heart racing or skipping beats, or you have chest pain. You have shortness of breath or trouble breathing. You have swelling, redness, warmth, or pain in the arm in which the PICC is placed. You have a red streak going up your arm that   starts under the PICC dressing. These symptoms may be an emergency. Get help right away. Call 911. Do not wait to see if the symptoms will go away. Do not drive yourself to the hospital. Summary A peripherally inserted central catheter (PICC) is a long, thin, flexible tube (catheter) that is put into a vein in the arm or leg. If cared for properly, a PICC can remain in place for many months. Having a PICC can allow you to go home from the hospital sooner and continue treatment at home. The PICC is inserted using a germ-free (sterile) technique by a specially trained health care provider. Only a trained health care provider should remove it. Do not have your blood pressure checked on the arm in which your PICC is placed. Always keep your PICC identification card with you. This information is not intended to replace advice given to you by your health care provider. Make sure you discuss any questions you have with your health care provider. Document Revised: 07/18/2020 Document Reviewed: 07/18/2020 Elsevier Patient Education  2023 Elsevier Inc.  

## 2021-05-27 NOTE — Progress Notes (Signed)
Patient presents for treatment. RN assessment completed along with the following: ? ?Labs/vitals reviewed - Yes, and within treatment parameters.   ?Weight within 10% of previous measurement - Yes ?Informed consent completed and reflects current therapy/intent - Yes, on date 08/19/20             ?Provider progress note reviewed - Yes, today's provider note was reviewed. ?Treatment/Antibody/Supportive plan reviewed - Yes, and there are no adjustments needed for today's treatment. ?S&H and other orders reviewed - Yes, and there are no additional orders identified. ?Previous treatment date reviewed - Yes, and the appropriate amount of time has elapsed between treatments. ?Clinic Hand Off Received from - Ned Card, NP ? ?Patient to proceed with treatment.   ?

## 2021-05-27 NOTE — Progress Notes (Signed)
?[facility] ?OFFICE PROGRESS NOTE ? ? ?Diagnosis: Colon cancer ? ?INTERVAL HISTORY:  ? ?April [name] returns as scheduled.  She completed another cycle of FOLFIRI/Panitumumab 05/06/2021.  She denies significant nausea.  Single mouth sore on the left side of the tongue.  No significant diarrhea.  Stable rash on the face. ? ?Objective: ? ?Vital signs in last 24 hours: ? ?Blood pressure 130/90, pulse 87, temperature 98.2 ?F (36.8 ?C), temperature source Oral, resp. rate 18, height 5' 5" (1.651 m), weight 166 lb 9.6 oz (75.6 kg), last menstrual period 01/07/2006, SpO2 100 %. ?  ? ?HEENT: No thrush.  Healing ulceration left lateral tongue. ?Resp: Lungs clear bilaterally. ?Cardio: Regular rate and rhythm. ?GI: Abdomen soft.  Mild tenderness left mid abdomen.  No hepatosplenomegaly.  No mass. ?Vascular: No leg edema. ?Skin: Mild acne type rash on the face. ?PICC site without erythema. ? ?Lab Results: ? ?Lab Results  ?Component Value Date  ? WBC 7.9 05/27/2021  ? HGB 13.5 05/27/2021  ? HCT 40.5 05/27/2021  ? MCV 95.1 05/27/2021  ? PLT 239 05/27/2021  ? NEUTROABS 4.4 05/27/2021  ? ? ?Imaging: ? ?No results found. ? ?Medications: I have reviewed the patient's current medications. ? ?Assessment/Plan: ?Sigmoid colon cancer, stage IV (pT4a,pN2b,M1c) ?Colonoscopy 03/24/2019-3 rectal polyps-hyperplastic polyps, distal colon biopsy-at least intramucosal adenocarcinoma, completely obstructing mass in the distal sigmoid colon, could not be traversed, intact mismatch repair protein expression ?03/24/2019-CEA 56.1 ?03/29/2019 CT abdomen/pelvis-circumferential thickening involving the entire mid and distal sigmoid colon to the level of the rectosigmoid junction, solid/cystic mass in the left ovary, bilateral nephrolithiasis ?Robotic assisted low anterior resection, mesenteric lymphadenectomy, bilateral salpingo-oophorectomy 04/08/2019 ?Pathology ([facility] review of outside pathology) metastatic adenocarcinoma involving the  peritoneum overlying the round ligament, serosa of the urinary bladder, serosa of the right ureter a sacral area, pelvic peritoneum, and left ovary.  Omental biopsy with focal mucin pools with no carcinoma cells identified, right hemidiaphragm biopsy involved by metastatic adenocarcinoma, invasive adenocarcinoma the sigmoid colon, moderately differentiated, T4a, perineural and vascular invasion present, 7/22 lymph nodes, multiple tumor deposits, resection margins negative ?Negative for PD-L1, low probability of MSI-high, HER-2 negative, negative for BRAF, NRAS and KRAS alterations ?CTs 05/19/2019-no evidence of metastatic disease, findings suspicious for colitis of the transverse and ascending colon, small amount of ascites in the cul-de-sac, bilateral renal calculi ?Cycle 1 FOLFIRI 05/24/2019, bevacizumab added with cycle 2 ?Cycle 4 FOLFIRI/bevacizumab 07/05/2019 ?Cycle 5 FOLFIRI 07/27/2019, bevacizumab held secondary to hypertension ?Cycle 6 FOLFIRI 08/10/1999, bevacizumab held secondary to hypertension ?CTs 08/30/2019-no evidence of recurrent disease, new subsolid right lower lobe nodule felt to be inflammatory, emphysema ?Cycle 7 FOLFIRI 09/26/2019, bevacizumab remains on hold secondary to hypertension ?Cycle 8 FOLFIRI 10/17/2019, bevacizumab held secondary to hypertension, Udenyca added for neutropenia ?Cycle 9 FOLFIRI 11/07/2019, bevacizumab held secondary to hypertension, Udenyca ?Cycle 10 FOLFIRI 11/28/2019, bevacizumab held, [place] ?Cycle 11 FOLFIRI 12/26/2019, bevacizumab held, [place] ?CTs 01/25/2020-no evidence of recurrent disease, resolution of right lower lobe nodule ?Maintenance Xeloda beginning 02/06/2020 ?CTs 04/25/2020- no evidence of metastatic disease, multiple bilateral renal calculi without hydronephrosis ?Maintenance Xeloda continued ?CT abdomen/pelvis without contrast 08/10/2020-bilateral staghorn renal calculi, no evidence of metastatic disease; addendum 08/23/2020-small but increasing omental  nodules. ?Cycle 1 FOLFIRI/panitumumab 09/19/2020 ?Cycle 2 FOLFIRI/Panitumumab 10/03/2020 ?Cycle 3 FOLFIRI/Panitumumab 10/17/2020 ?Cycle 4 FOLFIRI/panitumumab 10/31/2020 ?Cycle 5 FOLFIRI/Panitumumab 11/14/2020 ?CTs 11/26/2020-stable omental metastases compared to 10/22/2020, mildly decreased from 08/10/2020.  Left lower quadrant tiny paracolic gutter implant slightly decreased from CT 08/10/2020.  No new or progressive metastatic  disease in the abdomen or pelvis. ?Cycle 6 FOLFIRI/Panitumumab 11/28/2020, irinotecan dose reduced, treatment schedule adjusted to every 3 weeks going forward ?Cycle 7 FOLFIRI/panitumumab 12/24/2020 ?Cycle 8 FOLFIRI/Panitumumab 01/16/2021--treatment held due to hypertension in the infusion area. ?Cycle 8 FOLFIRI/panitumumab 01/21/2021 ?Cycle 9 FOLFIRI/panitumumab 02/11/2021 ?Cycle 10 FOLFIRI/Panitumumab 03/04/2021 ?Cycle 11 FOLFIRI/Panitumumab 03/25/2021 ?CT abdomen/pelvis 04/10/2021-stable right omental and left posterior paracolic gutter nodules ?Cycle 12 FOLFIRI/panitumumab 04/15/2021 ?Cycle 13 FOLFIRI/panitumumab 05/06/2021 ?Cycle 14 FOLFIRI/Panitumumab 05/27/2021 ?  ?Hypertension ?G4 P3, twins ?Kidney stones-bilateral staghorn renal calculi on CT 08/10/2020 ?Infected Port-A-Cath 09/12/2019-placed on Augmentin, referred for Port-A-Cath removal; Port-A-Cath removed 09/14/2019; PICC line placed 09/14/2019; culture staph aureus.  Course of Septra completed. ?Neutropenia secondary to chemotherapy-Udenyca added with cycle 8 FOLFIRI ?Right nephrostomy tube 09/12/2020; stent placed 10/09/2020, percutaneous nephrostolithotomy treatment of right-sided kidney stones 10/09/2020, malpositioned right ureter stent replacement 10/23/2020, right ureter stent removed 10/29/2020 ?  ? ?Disposition: April [name] appears stable.  She has completed 13 cycles of FOLFIRI/Panitumumab.  There is no clinical evidence of disease progression.  Plan to proceed with cycle 14 today as scheduled. ? ?CBC from today reviewed.  Counts adequate to  proceed as above. ? ?She will return for an office visit and chemotherapy in 3 weeks. ? ? ? ?Ned Card ANP/GNP-BC  ? ?05/27/2021  ?8:47 AM ? ? ? ? ? ? ? ?

## 2021-05-27 NOTE — Patient Instructions (Signed)
[facility]  Discharge Instructions: ?Thank you for choosing [facility] to provide your oncology and hematology care.  ? ?If you have a lab appointment with the [facility], please go directly to the [facility] and check in at the registration area. ?  ?Wear comfortable clothing and clothing appropriate for easy access to any Portacath or PICC line.  ? ?We strive to give you quality time with your provider. You may need to reschedule your appointment if you arrive late (15 or more minutes).  Arriving late affects you and other patients whose appointments are after yours.  Also, if you miss three or more appointments without notifying the office, you may be dismissed from the clinic at the provider?s discretion.    ?  ?For prescription refill requests, have your pharmacy contact our office and allow 72 hours for refills to be completed.   ? ?Today you received the following chemotherapy and/or immunotherapy agents Vectibix, Fofiri    ?  ?To help prevent nausea and vomiting after your treatment, we encourage you to take your nausea medication as directed. ? ?BELOW ARE SYMPTOMS THAT SHOULD BE REPORTED IMMEDIATELY: ?*FEVER GREATER THAN 100.4 F (38 ?C) OR HIGHER ?*CHILLS OR SWEATING ?*NAUSEA AND VOMITING THAT IS NOT CONTROLLED WITH YOUR NAUSEA MEDICATION ?*UNUSUAL SHORTNESS OF BREATH ?*UNUSUAL BRUISING OR BLEEDING ?*URINARY PROBLEMS (pain or burning when urinating, or frequent urination) ?*BOWEL PROBLEMS (unusual diarrhea, constipation, pain near the anus) ?TENDERNESS IN MOUTH AND THROAT WITH OR WITHOUT PRESENCE OF ULCERS (sore throat, sores in mouth, or a toothache) ?UNUSUAL RASH, SWELLING OR PAIN  ?UNUSUAL VAGINAL DISCHARGE OR ITCHING  ? ?Items with * indicate a potential emergency and should be followed up as soon as possible or go to the Emergency Department if any problems should occur. ? ?Please show the CHEMOTHERAPY ALERT CARD or IMMUNOTHERAPY ALERT CARD at check-in  to the Emergency Department and triage nurse. ? ?Should you have questions after your visit or need to cancel or reschedule your appointment, please contact [facility]  Dept: [phone]  and follow the prompts.  Office hours are 8:00 a.m. to 4:30 p.m. Monday - Friday. Please note that voicemails left after 4:00 p.m. may not be returned until the following business day.  We are closed weekends and major holidays. You have access to a nurse at all times for urgent questions. Please call the main number to the clinic Dept: [phone] and follow the prompts. ? ? ?For any non-urgent questions, you may also contact your provider using MyChart. We now offer e-Visits for anyone 67 and older to request care online for non-urgent symptoms. For details visit [email or web address]. ?  ?Also download the MyChart app! Go to the app store, search "MyChart", open the app, select [facility], and log in with your MyChart username and password. ? ?Due to Covid, a mask is required upon entering the hospital/clinic. If you do not have a mask, one will be given to you upon arrival. For doctor visits, patients may have 1 support person aged 70 or older with them. For treatment visits, patients cannot have anyone with them due to current Covid guidelines and our immunocompromised population.  ? ?

## 2021-05-27 NOTE — Progress Notes (Signed)
Patient seen by Lisa Thomas NP today  Vitals are within treatment parameters.  Labs reviewed by Lisa Thomas NP and are within treatment parameters.  Per physician team, patient is ready for treatment and there are NO modifications to the treatment plan.     

## 2021-05-29 ENCOUNTER — Inpatient Hospital Stay: Payer: Medicaid Other

## 2021-05-29 VITALS — BP 117/93 | HR 86 | Temp 98.0°F | Resp 18

## 2021-05-29 DIAGNOSIS — C20 Malignant neoplasm of rectum: Secondary | ICD-10-CM

## 2021-05-29 DIAGNOSIS — Z5111 Encounter for antineoplastic chemotherapy: Secondary | ICD-10-CM | POA: Diagnosis not present

## 2021-05-29 MED ORDER — SODIUM CHLORIDE 0.9% FLUSH
10.0000 mL | INTRAVENOUS | Status: DC | PRN
  Administered 2021-05-29: 10 mL

## 2021-05-29 MED ORDER — HEPARIN SOD (PORK) LOCK FLUSH 100 UNIT/ML IV SOLN
250.0000 [IU] | Freq: Once | INTRAVENOUS | Status: AC | PRN
  Administered 2021-05-29: 250 [IU]

## 2021-05-29 NOTE — Patient Instructions (Signed)
PICC Home Care Guide A peripherally inserted central catheter (PICC) is a form of IV access that allows medicines and IV fluids to be quickly put into the blood and spread throughout the body. The PICC is a long, thin, flexible tube (catheter) that is put into a vein in a person's arm or leg. The catheter ends in a large vein just outside the heart called the superior vena cava (SVC). After the PICC is put in, a chest X-ray may be done to make sure that it is in the right place. A PICC may be placed for different reasons, such as: To give medicines and liquid nutrition. To give IV fluids and blood products. To take blood samples often. If there is trouble placing a peripheral intravenous (PIV) catheter. If cared for properly, a PICC can remain in place for many months. Having a PICC can allow you to go home from the hospital sooner and continue treatment at home. Medicines and PICC care can be managed at home by a family member, caregiver, or home health care team. What are the risks? Generally, having a PICC is safe. However, problems may occur, including: A blood clot (thrombus) forming in or at the end of the PICC. A blood clot forming in a vein (deep vein thrombosis) or traveling to the lung (pulmonary embolism). Inflammation of the vein (phlebitis) in which the PICC is placed. Infection at the insertion site or in the blood. Blood infections from central lines, like PICCs, can be serious and often require a hospital stay. PICC malposition, or PICC movement or poor placement. A break or cut in the PICC. Do not use scissors near the PICC. Nerve or tendon irritation or injury during PICC insertion. How to care for your PICC Please follow the specific guidelines provided by your health care provider. Preventing infection You and any caregivers should wash your hands often with soap and water for at least 20 seconds. Wash hands: Before touching the PICC or the infusion device. Before changing a  bandage (dressing). Do not change the dressing unless you have been taught to do so and have shown you are able to change it safely. Flush the PICC as told. Tell your health care provider right away if the PICC is hard to flush or does not flush. Do not use force to flush the PICC. Use clean and germ-free (sterile) supplies only. Keep the supplies in a dry place. Do not reuse needles, syringes, or any other supplies. Reusing supplies can lead to infection. Keep the PICC dressing dry and secure it with tape if the edges stop sticking to your skin. Check your PICC insertion site every day for signs of infection. Check for: Redness, swelling, or pain. Fluid or blood. Warmth. Pus or a bad smell. Preventing other problems Do not use a syringe that is less than 10 mL to flush the PICC. Do not have your blood pressure checked on the arm in which the PICC is placed. Do not ever pull or tug on the PICC. Keep it secured to your arm with tape or a stretch wrap when not in use. Do not take the PICC out yourself. Only a trained health care provider should remove the PICC. Keep pets and children away from your PICC. How to care for your PICC dressing Keep your PICC dressing clean and dry to prevent infection. Do not take baths, swim, or use a hot tub until your health care provider approves. Ask your health care provider if you can take   showers. You may only be allowed to take sponge baths. When you are allowed to shower: Ask your health care provider to teach you how to wrap the PICC. Cover the PICC with clear plastic wrap and tape to keep it dry while showering. Follow instructions from your health care provider about how to take care of your insertion site and dressing. Make sure you: Wash your hands with soap and water for at least 20 seconds before and after you change your dressing. If soap and water are not available, use hand sanitizer. Change your dressing only if taught to do so by your health care  provider. Your PICC dressing needs to be changed if it becomes loose or wet. Leave stitches (sutures), skin glue, or adhesive strips in place. These skin closures may need to stay in place for 2 weeks or longer. If adhesive strip edges start to loosen and curl up, you may trim the loose edges. Do not remove adhesive strips completely unless your health care provider tells you to do that. Follow these instructions at home: Disposal of supplies Throw away any syringes in a disposal container that is meant for sharp items (sharps container). You can buy a sharps container from a pharmacy, or you can make one by using an empty, hard plastic bottle with a lid. Place any used dressings or infusion bags into a plastic bag. Throw that bag in the trash. General instructions  Always carry your PICC identification card or wear a medical alert bracelet. Keep the tube clamped at all times, unless it is being used. Always carry a smooth-edge clamp with you to clamp the PICC if it breaks. Do not use scissors or sharp objects near the tube. You may bend your arm and move it freely. If your PICC is near or at the bend of your elbow, avoid activity with repeated motion at the elbow. Avoid lifting heavy objects as told by your health care provider. Keep all follow-up visits. This is important. You will need to have your PICC dressing changed at least once a week. Contact a health care provider if: You have pain in your arm, ear, face, or teeth. You have a fever or chills. You have redness, swelling, or pain around the insertion site. You have fluid or blood coming from the insertion site. Your insertion site feels warm to the touch. You have pus or a bad smell coming from the insertion site. Your skin feels hard and raised around the insertion site. Your PICC dressing has gotten wet or is coming off and you have not been taught how to change it. Get help right away if: You have problems with your PICC, such as  your PICC: Was tugged or pulled and has partially come out. Do not  push the PICC back in. Cannot be flushed, is hard to flush, or leaks around the insertion site when it is flushed. Makes a flushing sound when it is flushed. Appears to have a hole or tear. Is accidentally pulled all the way out. If this happens, cover the insertion site with a gauze dressing. Do not throw the PICC away. Your health care provider will need to check it to be sure the entire catheter came out. You feel your heart racing or skipping beats, or you have chest pain. You have shortness of breath or trouble breathing. You have swelling, redness, warmth, or pain in the arm in which the PICC is placed. You have a red streak going up your arm that   starts under the PICC dressing. These symptoms may be an emergency. Get help right away. Call 911. Do not wait to see if the symptoms will go away. Do not drive yourself to the hospital. Summary A peripherally inserted central catheter (PICC) is a long, thin, flexible tube (catheter) that is put into a vein in the arm or leg. If cared for properly, a PICC can remain in place for many months. Having a PICC can allow you to go home from the hospital sooner and continue treatment at home. The PICC is inserted using a germ-free (sterile) technique by a specially trained health care provider. Only a trained health care provider should remove it. Do not have your blood pressure checked on the arm in which your PICC is placed. Always keep your PICC identification card with you. This information is not intended to replace advice given to you by your health care provider. Make sure you discuss any questions you have with your health care provider. Document Revised: 07/18/2020 Document Reviewed: 07/18/2020 Elsevier Patient Education  2023 Elsevier Inc.  

## 2021-06-03 ENCOUNTER — Inpatient Hospital Stay: Payer: Medicaid Other

## 2021-06-03 VITALS — BP 152/99 | HR 87 | Temp 98.4°F | Resp 18

## 2021-06-03 DIAGNOSIS — C20 Malignant neoplasm of rectum: Secondary | ICD-10-CM

## 2021-06-03 DIAGNOSIS — Z95828 Presence of other vascular implants and grafts: Secondary | ICD-10-CM

## 2021-06-03 DIAGNOSIS — Z5111 Encounter for antineoplastic chemotherapy: Secondary | ICD-10-CM | POA: Diagnosis not present

## 2021-06-03 MED ORDER — SODIUM CHLORIDE 0.9% FLUSH
10.0000 mL | Freq: Once | INTRAVENOUS | Status: AC
  Administered 2021-06-03: 10 mL

## 2021-06-03 MED ORDER — HEPARIN SOD (PORK) LOCK FLUSH 100 UNIT/ML IV SOLN
500.0000 [IU] | Freq: Once | INTRAVENOUS | Status: AC
  Administered 2021-06-03: 500 [IU]

## 2021-06-03 NOTE — Progress Notes (Signed)
Patient stated the PICC site has been itching after she has taken showers.  Asked patient what she was covering picc line with when shower.  Patient stated she has been using a cotton stocking with medipore tape over the top.  Instructed patient she needs to cover it with some sort of plastic bag/saran wrap as what she is currently using allows water to seep into the bandage.  Patient verbalized understanding and stated she will use saran wrap next time she showers.

## 2021-06-03 NOTE — Patient Instructions (Signed)
PICC Home Care Guide A peripherally inserted central catheter (PICC) is a form of IV access that allows medicines and IV fluids to be quickly put into the blood and spread throughout the body. The PICC is a long, thin, flexible tube (catheter) that is put into a vein in a person's arm or leg. The catheter ends in a large vein just outside the heart called the superior vena cava (SVC). After the PICC is put in, a chest X-ray may be done to make sure that it is in the right place. A PICC may be placed for different reasons, such as: To give medicines and liquid nutrition. To give IV fluids and blood products. To take blood samples often. If there is trouble placing a peripheral intravenous (PIV) catheter. If cared for properly, a PICC can remain in place for many months. Having a PICC can allow you to go home from the hospital sooner and continue treatment at home. Medicines and PICC care can be managed at home by a family member, caregiver, or home health care team. What are the risks? Generally, having a PICC is safe. However, problems may occur, including: A blood clot (thrombus) forming in or at the end of the PICC. A blood clot forming in a vein (deep vein thrombosis) or traveling to the lung (pulmonary embolism). Inflammation of the vein (phlebitis) in which the PICC is placed. Infection at the insertion site or in the blood. Blood infections from central lines, like PICCs, can be serious and often require a hospital stay. PICC malposition, or PICC movement or poor placement. A break or cut in the PICC. Do not use scissors near the PICC. Nerve or tendon irritation or injury during PICC insertion. How to care for your PICC Please follow the specific guidelines provided by your health care provider. Preventing infection You and any caregivers should wash your hands often with soap and water for at least 20 seconds. Wash hands: Before touching the PICC or the infusion device. Before changing a  bandage (dressing). Do not change the dressing unless you have been taught to do so and have shown you are able to change it safely. Flush the PICC as told. Tell your health care provider right away if the PICC is hard to flush or does not flush. Do not use force to flush the PICC. Use clean and germ-free (sterile) supplies only. Keep the supplies in a dry place. Do not reuse needles, syringes, or any other supplies. Reusing supplies can lead to infection. Keep the PICC dressing dry and secure it with tape if the edges stop sticking to your skin. Check your PICC insertion site every day for signs of infection. Check for: Redness, swelling, or pain. Fluid or blood. Warmth. Pus or a bad smell. Preventing other problems Do not use a syringe that is less than 10 mL to flush the PICC. Do not have your blood pressure checked on the arm in which the PICC is placed. Do not ever pull or tug on the PICC. Keep it secured to your arm with tape or a stretch wrap when not in use. Do not take the PICC out yourself. Only a trained health care provider should remove the PICC. Keep pets and children away from your PICC. How to care for your PICC dressing Keep your PICC dressing clean and dry to prevent infection. Do not take baths, swim, or use a hot tub until your health care provider approves. Ask your health care provider if you can take   showers. You may only be allowed to take sponge baths. When you are allowed to shower: Ask your health care provider to teach you how to wrap the PICC. Cover the PICC with clear plastic wrap and tape to keep it dry while showering. Follow instructions from your health care provider about how to take care of your insertion site and dressing. Make sure you: Wash your hands with soap and water for at least 20 seconds before and after you change your dressing. If soap and water are not available, use hand sanitizer. Change your dressing only if taught to do so by your health care  provider. Your PICC dressing needs to be changed if it becomes loose or wet. Leave stitches (sutures), skin glue, or adhesive strips in place. These skin closures may need to stay in place for 2 weeks or longer. If adhesive strip edges start to loosen and curl up, you may trim the loose edges. Do not remove adhesive strips completely unless your health care provider tells you to do that. Follow these instructions at home: Disposal of supplies Throw away any syringes in a disposal container that is meant for sharp items (sharps container). You can buy a sharps container from a pharmacy, or you can make one by using an empty, hard plastic bottle with a lid. Place any used dressings or infusion bags into a plastic bag. Throw that bag in the trash. General instructions  Always carry your PICC identification card or wear a medical alert bracelet. Keep the tube clamped at all times, unless it is being used. Always carry a smooth-edge clamp with you to clamp the PICC if it breaks. Do not use scissors or sharp objects near the tube. You may bend your arm and move it freely. If your PICC is near or at the bend of your elbow, avoid activity with repeated motion at the elbow. Avoid lifting heavy objects as told by your health care provider. Keep all follow-up visits. This is important. You will need to have your PICC dressing changed at least once a week. Contact a health care provider if: You have pain in your arm, ear, face, or teeth. You have a fever or chills. You have redness, swelling, or pain around the insertion site. You have fluid or blood coming from the insertion site. Your insertion site feels warm to the touch. You have pus or a bad smell coming from the insertion site. Your skin feels hard and raised around the insertion site. Your PICC dressing has gotten wet or is coming off and you have not been taught how to change it. Get help right away if: You have problems with your PICC, such as  your PICC: Was tugged or pulled and has partially come out. Do not  push the PICC back in. Cannot be flushed, is hard to flush, or leaks around the insertion site when it is flushed. Makes a flushing sound when it is flushed. Appears to have a hole or tear. Is accidentally pulled all the way out. If this happens, cover the insertion site with a gauze dressing. Do not throw the PICC away. Your health care provider will need to check it to be sure the entire catheter came out. You feel your heart racing or skipping beats, or you have chest pain. You have shortness of breath or trouble breathing. You have swelling, redness, warmth, or pain in the arm in which the PICC is placed. You have a red streak going up your arm that   starts under the PICC dressing. These symptoms may be an emergency. Get help right away. Call 911. Do not wait to see if the symptoms will go away. Do not drive yourself to the hospital. Summary A peripherally inserted central catheter (PICC) is a long, thin, flexible tube (catheter) that is put into a vein in the arm or leg. If cared for properly, a PICC can remain in place for many months. Having a PICC can allow you to go home from the hospital sooner and continue treatment at home. The PICC is inserted using a germ-free (sterile) technique by a specially trained health care provider. Only a trained health care provider should remove it. Do not have your blood pressure checked on the arm in which your PICC is placed. Always keep your PICC identification card with you. This information is not intended to replace advice given to you by your health care provider. Make sure you discuss any questions you have with your health care provider. Document Revised: 07/18/2020 Document Reviewed: 07/18/2020 Elsevier Patient Education  2023 Elsevier Inc.  

## 2021-06-04 ENCOUNTER — Encounter (HOSPITAL_BASED_OUTPATIENT_CLINIC_OR_DEPARTMENT_OTHER): Payer: Self-pay | Admitting: Nurse Practitioner

## 2021-06-05 ENCOUNTER — Encounter: Payer: Self-pay | Admitting: Oncology

## 2021-06-05 NOTE — Progress Notes (Signed)
Received call from patient in Baxter Flattery G's absence from [place] regarding concerns with housing.  Advised I would reach out to social workers for follow up. She verbalized understanding and provided alternate number as well.  Staff message sent to social workers and Event organiser.

## 2021-06-06 ENCOUNTER — Encounter: Payer: Self-pay | Admitting: Licensed Clinical Social Worker

## 2021-06-06 NOTE — Progress Notes (Signed)
[facility] Work  Clinical Social Work was referred by Arts development officer for assessment of psychosocial needs.  Clinical Social Worker attempted to contact patient by phone  to offer support and assess for needs.  Patient spoke with Arts development officer about hosing needs.  CSW left voicemail for patient with contact information and request for return call.      Adelene Amas, LCSW  Clinical Social Worker [facility]        Patient is participating in a Managed Medicaid Plan:  Yes

## 2021-06-11 ENCOUNTER — Inpatient Hospital Stay: Payer: Medicaid Other

## 2021-06-11 VITALS — BP 140/89 | HR 87 | Temp 98.6°F | Resp 18

## 2021-06-11 DIAGNOSIS — Z95828 Presence of other vascular implants and grafts: Secondary | ICD-10-CM

## 2021-06-11 DIAGNOSIS — C20 Malignant neoplasm of rectum: Secondary | ICD-10-CM

## 2021-06-11 DIAGNOSIS — Z5111 Encounter for antineoplastic chemotherapy: Secondary | ICD-10-CM | POA: Diagnosis not present

## 2021-06-11 MED ORDER — SODIUM CHLORIDE 0.9% FLUSH
10.0000 mL | Freq: Once | INTRAVENOUS | Status: AC
  Administered 2021-06-11: 10 mL

## 2021-06-11 MED ORDER — HEPARIN SOD (PORK) LOCK FLUSH 100 UNIT/ML IV SOLN
500.0000 [IU] | Freq: Once | INTRAVENOUS | Status: AC
  Administered 2021-06-11: 500 [IU]

## 2021-06-11 MED ORDER — SODIUM CHLORIDE 0.9% FLUSH
10.0000 mL | Freq: Once | INTRAVENOUS | Status: AC
  Administered 2021-06-11: 10 mL via INTRAVENOUS

## 2021-06-11 NOTE — Patient Instructions (Signed)
PICC Home Care Guide A peripherally inserted central catheter (PICC) is a form of IV access that allows medicines and IV fluids to be quickly put into the blood and spread throughout the body. The PICC is a long, thin, flexible tube (catheter) that is put into a vein in a person's arm or leg. The catheter ends in a large vein just outside the heart called the superior vena cava (SVC). After the PICC is put in, a chest X-ray may be done to make sure that it is in the right place. A PICC may be placed for different reasons, such as: To give medicines and liquid nutrition. To give IV fluids and blood products. To take blood samples often. If there is trouble placing a peripheral intravenous (PIV) catheter. If cared for properly, a PICC can remain in place for many months. Having a PICC can allow you to go home from the hospital sooner and continue treatment at home. Medicines and PICC care can be managed at home by a family member, caregiver, or home health care team. What are the risks? Generally, having a PICC is safe. However, problems may occur, including: A blood clot (thrombus) forming in or at the end of the PICC. A blood clot forming in a vein (deep vein thrombosis) or traveling to the lung (pulmonary embolism). Inflammation of the vein (phlebitis) in which the PICC is placed. Infection at the insertion site or in the blood. Blood infections from central lines, like PICCs, can be serious and often require a hospital stay. PICC malposition, or PICC movement or poor placement. A break or cut in the PICC. Do not use scissors near the PICC. Nerve or tendon irritation or injury during PICC insertion. How to care for your PICC Please follow the specific guidelines provided by your health care provider. Preventing infection You and any caregivers should wash your hands often with soap and water for at least 20 seconds. Wash hands: Before touching the PICC or the infusion device. Before changing a  bandage (dressing). Do not change the dressing unless you have been taught to do so and have shown you are able to change it safely. Flush the PICC as told. Tell your health care provider right away if the PICC is hard to flush or does not flush. Do not use force to flush the PICC. Use clean and germ-free (sterile) supplies only. Keep the supplies in a dry place. Do not reuse needles, syringes, or any other supplies. Reusing supplies can lead to infection. Keep the PICC dressing dry and secure it with tape if the edges stop sticking to your skin. Check your PICC insertion site every day for signs of infection. Check for: Redness, swelling, or pain. Fluid or blood. Warmth. Pus or a bad smell. Preventing other problems Do not use a syringe that is less than 10 mL to flush the PICC. Do not have your blood pressure checked on the arm in which the PICC is placed. Do not ever pull or tug on the PICC. Keep it secured to your arm with tape or a stretch wrap when not in use. Do not take the PICC out yourself. Only a trained health care provider should remove the PICC. Keep pets and children away from your PICC. How to care for your PICC dressing Keep your PICC dressing clean and dry to prevent infection. Do not take baths, swim, or use a hot tub until your health care provider approves. Ask your health care provider if you can take   showers. You may only be allowed to take sponge baths. When you are allowed to shower: Ask your health care provider to teach you how to wrap the PICC. Cover the PICC with clear plastic wrap and tape to keep it dry while showering. Follow instructions from your health care provider about how to take care of your insertion site and dressing. Make sure you: Wash your hands with soap and water for at least 20 seconds before and after you change your dressing. If soap and water are not available, use hand sanitizer. Change your dressing only if taught to do so by your health care  provider. Your PICC dressing needs to be changed if it becomes loose or wet. Leave stitches (sutures), skin glue, or adhesive strips in place. These skin closures may need to stay in place for 2 weeks or longer. If adhesive strip edges start to loosen and curl up, you may trim the loose edges. Do not remove adhesive strips completely unless your health care provider tells you to do that. Follow these instructions at home: Disposal of supplies Throw away any syringes in a disposal container that is meant for sharp items (sharps container). You can buy a sharps container from a pharmacy, or you can make one by using an empty, hard plastic bottle with a lid. Place any used dressings or infusion bags into a plastic bag. Throw that bag in the trash. General instructions  Always carry your PICC identification card or wear a medical alert bracelet. Keep the tube clamped at all times, unless it is being used. Always carry a smooth-edge clamp with you to clamp the PICC if it breaks. Do not use scissors or sharp objects near the tube. You may bend your arm and move it freely. If your PICC is near or at the bend of your elbow, avoid activity with repeated motion at the elbow. Avoid lifting heavy objects as told by your health care provider. Keep all follow-up visits. This is important. You will need to have your PICC dressing changed at least once a week. Contact a health care provider if: You have pain in your arm, ear, face, or teeth. You have a fever or chills. You have redness, swelling, or pain around the insertion site. You have fluid or blood coming from the insertion site. Your insertion site feels warm to the touch. You have pus or a bad smell coming from the insertion site. Your skin feels hard and raised around the insertion site. Your PICC dressing has gotten wet or is coming off and you have not been taught how to change it. Get help right away if: You have problems with your PICC, such as  your PICC: Was tugged or pulled and has partially come out. Do not  push the PICC back in. Cannot be flushed, is hard to flush, or leaks around the insertion site when it is flushed. Makes a flushing sound when it is flushed. Appears to have a hole or tear. Is accidentally pulled all the way out. If this happens, cover the insertion site with a gauze dressing. Do not throw the PICC away. Your health care provider will need to check it to be sure the entire catheter came out. You feel your heart racing or skipping beats, or you have chest pain. You have shortness of breath or trouble breathing. You have swelling, redness, warmth, or pain in the arm in which the PICC is placed. You have a red streak going up your arm that   starts under the PICC dressing. These symptoms may be an emergency. Get help right away. Call 911. Do not wait to see if the symptoms will go away. Do not drive yourself to the hospital. Summary A peripherally inserted central catheter (PICC) is a long, thin, flexible tube (catheter) that is put into a vein in the arm or leg. If cared for properly, a PICC can remain in place for many months. Having a PICC can allow you to go home from the hospital sooner and continue treatment at home. The PICC is inserted using a germ-free (sterile) technique by a specially trained health care provider. Only a trained health care provider should remove it. Do not have your blood pressure checked on the arm in which your PICC is placed. Always keep your PICC identification card with you. This information is not intended to replace advice given to you by your health care provider. Make sure you discuss any questions you have with your health care provider. Document Revised: 07/18/2020 Document Reviewed: 07/18/2020 Elsevier Patient Education  2023 Elsevier Inc.  

## 2021-06-16 ENCOUNTER — Other Ambulatory Visit: Payer: Self-pay | Admitting: Oncology

## 2021-06-17 ENCOUNTER — Inpatient Hospital Stay: Payer: Medicare Other

## 2021-06-17 ENCOUNTER — Encounter: Payer: Self-pay | Admitting: Oncology

## 2021-06-17 ENCOUNTER — Encounter: Payer: Self-pay | Admitting: Nurse Practitioner

## 2021-06-17 ENCOUNTER — Encounter: Payer: Self-pay | Admitting: *Deleted

## 2021-06-17 ENCOUNTER — Inpatient Hospital Stay: Payer: Medicare Other | Attending: Oncology

## 2021-06-17 ENCOUNTER — Inpatient Hospital Stay (HOSPITAL_BASED_OUTPATIENT_CLINIC_OR_DEPARTMENT_OTHER): Payer: Medicare Other | Admitting: Nurse Practitioner

## 2021-06-17 ENCOUNTER — Other Ambulatory Visit (HOSPITAL_BASED_OUTPATIENT_CLINIC_OR_DEPARTMENT_OTHER): Payer: Self-pay

## 2021-06-17 VITALS — BP 140/80 | HR 84 | Temp 98.2°F | Resp 18 | Ht 65.0 in | Wt 171.6 lb

## 2021-06-17 DIAGNOSIS — C187 Malignant neoplasm of sigmoid colon: Secondary | ICD-10-CM | POA: Diagnosis not present

## 2021-06-17 DIAGNOSIS — C19 Malignant neoplasm of rectosigmoid junction: Secondary | ICD-10-CM

## 2021-06-17 DIAGNOSIS — Z79899 Other long term (current) drug therapy: Secondary | ICD-10-CM | POA: Diagnosis not present

## 2021-06-17 DIAGNOSIS — C7989 Secondary malignant neoplasm of other specified sites: Secondary | ICD-10-CM | POA: Insufficient documentation

## 2021-06-17 DIAGNOSIS — C20 Malignant neoplasm of rectum: Secondary | ICD-10-CM

## 2021-06-17 DIAGNOSIS — R109 Unspecified abdominal pain: Secondary | ICD-10-CM | POA: Diagnosis not present

## 2021-06-17 DIAGNOSIS — C7962 Secondary malignant neoplasm of left ovary: Secondary | ICD-10-CM | POA: Insufficient documentation

## 2021-06-17 DIAGNOSIS — R21 Rash and other nonspecific skin eruption: Secondary | ICD-10-CM | POA: Diagnosis not present

## 2021-06-17 DIAGNOSIS — C786 Secondary malignant neoplasm of retroperitoneum and peritoneum: Secondary | ICD-10-CM | POA: Insufficient documentation

## 2021-06-17 DIAGNOSIS — I10 Essential (primary) hypertension: Secondary | ICD-10-CM | POA: Diagnosis not present

## 2021-06-17 DIAGNOSIS — Z95828 Presence of other vascular implants and grafts: Secondary | ICD-10-CM

## 2021-06-17 DIAGNOSIS — Z5111 Encounter for antineoplastic chemotherapy: Secondary | ICD-10-CM | POA: Diagnosis not present

## 2021-06-17 LAB — CMP (CANCER CENTER ONLY)
ALT: 9 U/L (ref 0–44)
AST: 14 U/L — ABNORMAL LOW (ref 15–41)
Albumin: 4.2 g/dL (ref 3.5–5.0)
Alkaline Phosphatase: 126 U/L (ref 38–126)
Anion gap: 11 (ref 5–15)
BUN: 5 mg/dL — ABNORMAL LOW (ref 6–20)
CO2: 27 mmol/L (ref 22–32)
Calcium: 9.8 mg/dL (ref 8.9–10.3)
Chloride: 102 mmol/L (ref 98–111)
Creatinine: 0.56 mg/dL (ref 0.44–1.00)
GFR, Estimated: >60 mL/min (ref >60)
Glucose, Bld: 91 mg/dL (ref 70–99)
Potassium: 3.2 mmol/L — ABNORMAL LOW (ref 3.5–5.1)
Sodium: 140 mmol/L (ref 135–145)
Total Bilirubin: 0.3 mg/dL (ref 0.3–1.2)
Total Protein: 7.5 g/dL (ref 6.5–8.1)

## 2021-06-17 LAB — CBC WITH DIFFERENTIAL (CANCER CENTER ONLY)
Abs Immature Granulocytes: 0.01 10*3/uL (ref 0.00–0.07)
Basophils Absolute: 0 10*3/uL (ref 0.0–0.1)
Basophils Relative: 1 %
Eosinophils Absolute: 0.2 10*3/uL (ref 0.0–0.5)
Eosinophils Relative: 3 %
HCT: 39.9 % (ref 36.0–46.0)
Hemoglobin: 13.4 g/dL (ref 12.0–15.0)
Immature Granulocytes: 0 %
Lymphocytes Relative: 41 %
Lymphs Abs: 2.2 10*3/uL (ref 0.7–4.0)
MCH: 32 pg (ref 26.0–34.0)
MCHC: 33.6 g/dL (ref 30.0–36.0)
MCV: 95.2 fL (ref 80.0–100.0)
Monocytes Absolute: 0.3 10*3/uL (ref 0.1–1.0)
Monocytes Relative: 6 %
Neutro Abs: 2.7 10*3/uL (ref 1.7–7.7)
Neutrophils Relative %: 49 %
Platelet Count: 282 10*3/uL (ref 150–400)
RBC: 4.19 MIL/uL (ref 3.87–5.11)
RDW: 14 % (ref 11.5–15.5)
WBC Count: 5.5 10*3/uL (ref 4.0–10.5)
nRBC: 0 % (ref 0.0–0.2)

## 2021-06-17 LAB — MAGNESIUM: Magnesium: 1.7 mg/dL (ref 1.7–2.4)

## 2021-06-17 LAB — CEA (ACCESS): CEA (CHCC): 2.06 ng/mL (ref 0.00–5.00)

## 2021-06-17 MED ORDER — SODIUM CHLORIDE 0.9% FLUSH: 3.0000 mL | INTRAVENOUS | Status: DC | PRN

## 2021-06-17 MED ORDER — SODIUM CHLORIDE 0.9 % IV SOLN
1800.0000 mg/m2 | INTRAVENOUS | Status: DC
  Administered 2021-06-17: 3350 mg via INTRAVENOUS
  Filled 2021-06-17: qty 67

## 2021-06-17 MED ORDER — SODIUM CHLORIDE 0.9 % IV SOLN: Freq: Once | INTRAVENOUS | Status: AC

## 2021-06-17 MED ORDER — SODIUM CHLORIDE 0.9% FLUSH: 10.0000 mL | INTRAVENOUS | Status: DC | PRN

## 2021-06-17 MED ORDER — PALONOSETRON HCL INJECTION 0.25 MG/5ML
0.2500 mg | Freq: Once | INTRAVENOUS | Status: AC
  Administered 2021-06-17: 0.25 mg via INTRAVENOUS

## 2021-06-17 MED ORDER — ATROPINE SULFATE 1 MG/ML IV SOLN
0.5000 mg | Freq: Once | INTRAVENOUS | Status: AC
  Administered 2021-06-17: 0.5 mg via INTRAVENOUS

## 2021-06-17 MED ORDER — SODIUM CHLORIDE 0.9% FLUSH: 10.0000 mL | Freq: Once | INTRAVENOUS | Status: DC

## 2021-06-17 MED ORDER — HEPARIN SOD (PORK) LOCK FLUSH 100 UNIT/ML IV SOLN: 500.0000 [IU] | Freq: Once | INTRAVENOUS | Status: DC | PRN

## 2021-06-17 MED ORDER — ACETAMINOPHEN 325 MG PO TABS
ORAL_TABLET | ORAL | Status: AC
  Filled 2021-06-17: qty 2

## 2021-06-17 MED ORDER — PALONOSETRON HCL INJECTION 0.25 MG/5ML
INTRAVENOUS | Status: AC
  Filled 2021-06-17: qty 5

## 2021-06-17 MED ORDER — ATROPINE SULFATE 1 MG/ML IV SOLN
INTRAVENOUS | Status: AC
  Filled 2021-06-17: qty 1

## 2021-06-17 MED ORDER — DIPHENHYDRAMINE HCL 25 MG PO CAPS
ORAL_CAPSULE | ORAL | Status: AC
  Filled 2021-06-17: qty 1

## 2021-06-17 MED ORDER — ACETAMINOPHEN 325 MG PO TABS
650.0000 mg | ORAL_TABLET | Freq: Once | ORAL | Status: AC
  Administered 2021-06-17: 650 mg via ORAL

## 2021-06-17 MED ORDER — SODIUM CHLORIDE 0.9 % IV SOLN
200.0000 mg/m2 | Freq: Once | INTRAVENOUS | Status: AC
  Administered 2021-06-17: 372 mg via INTRAVENOUS
  Filled 2021-06-17: qty 18.6

## 2021-06-17 MED ORDER — SODIUM CHLORIDE 0.9 % IV SOLN
150.0000 mg | Freq: Once | INTRAVENOUS | Status: AC
  Administered 2021-06-17: 150 mg via INTRAVENOUS
  Filled 2021-06-17: qty 5

## 2021-06-17 MED ORDER — FLUOROURACIL CHEMO INJECTION 500 MG/10ML
200.0000 mg/m2 | Freq: Once | INTRAVENOUS | Status: AC
  Administered 2021-06-17: 350 mg via INTRAVENOUS
  Filled 2021-06-17: qty 7

## 2021-06-17 MED ORDER — PROCHLORPERAZINE MALEATE 10 MG PO TABS
10.0000 mg | ORAL_TABLET | Freq: Four times a day (QID) | ORAL | 2 refills | Status: DC | PRN
  Filled 2021-06-17: qty 40, 10d supply, fill #0

## 2021-06-17 MED ORDER — HEPARIN SOD (PORK) LOCK FLUSH 100 UNIT/ML IV SOLN: 250.0000 [IU] | Freq: Once | INTRAVENOUS | Status: DC | PRN

## 2021-06-17 MED ORDER — ALTEPLASE 2 MG IJ SOLR: 2.0000 mg | Freq: Once | INTRAMUSCULAR | Status: DC | PRN

## 2021-06-17 MED ORDER — SODIUM CHLORIDE 0.9 % IV SOLN
125.0000 mg/m2 | Freq: Once | INTRAVENOUS | Status: AC
  Administered 2021-06-17: 240 mg via INTRAVENOUS
  Filled 2021-06-17: qty 10

## 2021-06-17 MED ORDER — SODIUM CHLORIDE 0.9 % IV SOLN
10.0000 mg | Freq: Once | INTRAVENOUS | Status: AC
  Administered 2021-06-17: 10 mg via INTRAVENOUS
  Filled 2021-06-17: qty 1

## 2021-06-17 MED ORDER — DIPHENHYDRAMINE HCL 25 MG PO CAPS
25.0000 mg | ORAL_CAPSULE | Freq: Once | ORAL | Status: AC
  Administered 2021-06-17: 25 mg via ORAL

## 2021-06-17 MED ORDER — SODIUM CHLORIDE 0.9 % IV SOLN
6.0000 mg/kg | Freq: Once | INTRAVENOUS | Status: AC
  Administered 2021-06-17: 440 mg via INTRAVENOUS
  Filled 2021-06-17: qty 20

## 2021-06-17 NOTE — Patient Instructions (Signed)
[facility]  Discharge Instructions: Thank you for choosing [facility] to provide your oncology and hematology care.   If you have a lab appointment with the [facility], please go directly to the [facility] and check in at the registration area.   Wear comfortable clothing and clothing appropriate for easy access to any Portacath or PICC line.   We strive to give you quality time with your provider. You may need to reschedule your appointment if you arrive late (15 or more minutes).  Arriving late affects you and other patients whose appointments are after yours.  Also, if you miss three or more appointments without notifying the office, you may be dismissed from the clinic at the provider's discretion.      For prescription refill requests, have your pharmacy contact our office and allow 72 hours for refills to be completed.    Today you received the following chemotherapy and/or immunotherapy agents : Vectibix, Irinotecan, leucovorin, 5FU      To help prevent nausea and vomiting after your treatment, we encourage you to take your nausea medication as directed.  BELOW ARE SYMPTOMS THAT SHOULD BE REPORTED IMMEDIATELY: *FEVER GREATER THAN 100.4 F (38 C) OR HIGHER *CHILLS OR SWEATING *NAUSEA AND VOMITING THAT IS NOT CONTROLLED WITH YOUR NAUSEA MEDICATION *UNUSUAL SHORTNESS OF BREATH *UNUSUAL BRUISING OR BLEEDING *URINARY PROBLEMS (pain or burning when urinating, or frequent urination) *BOWEL PROBLEMS (unusual diarrhea, constipation, pain near the anus) TENDERNESS IN MOUTH AND THROAT WITH OR WITHOUT PRESENCE OF ULCERS (sore throat, sores in mouth, or a toothache) UNUSUAL RASH, SWELLING OR PAIN  UNUSUAL VAGINAL DISCHARGE OR ITCHING   Items with * indicate a potential emergency and should be followed up as soon as possible or go to the Emergency Department if any problems should occur.  Please show the CHEMOTHERAPY ALERT CARD or IMMUNOTHERAPY  ALERT CARD at check-in to the Emergency Department and triage nurse.  Should you have questions after your visit or need to cancel or reschedule your appointment, please contact [facility]  Dept: [phone]  and follow the prompts.  Office hours are 8:00 a.m. to 4:30 p.m. Monday - Friday. Please note that voicemails left after 4:00 p.m. may not be returned until the following business day.  We are closed weekends and major holidays. You have access to a nurse at all times for urgent questions. Please call the main number to the clinic Dept: [phone] and follow the prompts.   For any non-urgent questions, you may also contact your provider using MyChart. We now offer e-Visits for anyone 48 and older to request care online for non-urgent symptoms. For details visit [email or web address].   Also download the MyChart app! Go to the app store, search "MyChart", open the app, select [facility], and log in with your MyChart username and password.  Due to Covid, a mask is required upon entering the hospital/clinic. If you do not have a mask, one will be given to you upon arrival. For doctor visits, patients may have 1 support person aged 48 or older with them. For treatment visits, patients cannot have anyone with them due to current Covid guidelines and our immunocompromised population.

## 2021-06-17 NOTE — Progress Notes (Signed)
[facility] OFFICE PROGRESS NOTE   Diagnosis: Colon cancer  INTERVAL HISTORY:   [name] returns as scheduled.  She completed another cycle of FOLFIRI/Panitumumab 05/27/2021.  She tends to develop mild nausea on day 3, continues intermittently for 1 or 2 days.  She takes Compazine as needed.  No mouth sores.  No diarrhea.  Skin rash is stable.  She has a mild headache at present.  Objective:  Vital signs in last 24 hours:  Blood pressure 140/80, pulse 84, temperature 98.2 F (36.8 C), temperature source Oral, resp. rate 18, height '5\' 5"'  (1.651 m), weight 171 lb 9.6 oz (77.8 kg), last menstrual period 01/07/2006, SpO2 99 %.    HEENT: No thrush or ulcers. Resp: Lungs clear bilaterally. Cardio: Regular rate and rhythm. GI: No hepatosplenomegaly.  No mass. Vascular: No leg edema. PICC site without erythema.   Lab Results:  Lab Results  Component Value Date   WBC 5.5 06/17/2021   HGB 13.4 06/17/2021   HCT 39.9 06/17/2021   MCV 95.2 06/17/2021   PLT 282 06/17/2021   NEUTROABS 2.7 06/17/2021    Imaging:  No results found.  Medications: I have reviewed the patient's current medications.  Assessment/Plan: Sigmoid colon cancer, stage IV (pT4a,pN2b,M1c) Colonoscopy 03/24/2019-3 rectal polyps-hyperplastic polyps, distal colon biopsy-at least intramucosal adenocarcinoma, completely obstructing mass in the distal sigmoid colon, could not be traversed, intact mismatch repair protein expression 03/24/2019-CEA 56.1 03/29/2019 CT abdomen/pelvis-circumferential thickening involving the entire mid and distal sigmoid colon to the level of the rectosigmoid junction, solid/cystic mass in the left ovary, bilateral nephrolithiasis Robotic assisted low anterior resection, mesenteric lymphadenectomy, bilateral salpingo-oophorectomy 04/08/2019 Pathology ([facility] review of outside pathology) metastatic adenocarcinoma involving the peritoneum overlying the round ligament, serosa of  the urinary bladder, serosa of the right ureter a sacral area, pelvic peritoneum, and left ovary.  Omental biopsy with focal mucin pools with no carcinoma cells identified, right hemidiaphragm biopsy involved by metastatic adenocarcinoma, invasive adenocarcinoma the sigmoid colon, moderately differentiated, T4a, perineural and vascular invasion present, 7/22 lymph nodes, multiple tumor deposits, resection margins negative Negative for PD-L1, low probability of MSI-high, HER-2 negative, negative for BRAF, NRAS and KRAS alterations CTs 05/19/2019-no evidence of metastatic disease, findings suspicious for colitis of the transverse and ascending colon, small amount of ascites in the cul-de-sac, bilateral renal calculi Cycle 1 FOLFIRI 05/24/2019, bevacizumab added with cycle 2 Cycle 4 FOLFIRI/bevacizumab 07/05/2019 Cycle 5 FOLFIRI 07/27/2019, bevacizumab held secondary to hypertension Cycle 6 FOLFIRI 08/10/1999, bevacizumab held secondary to hypertension CTs 08/30/2019-no evidence of recurrent disease, new subsolid right lower lobe nodule felt to be inflammatory, emphysema Cycle 7 FOLFIRI 09/26/2019, bevacizumab remains on hold secondary to hypertension Cycle 8 FOLFIRI 10/17/2019, bevacizumab held secondary to hypertension, Udenyca added for neutropenia Cycle 9 FOLFIRI 11/07/2019, bevacizumab held secondary to hypertension, Udenyca Cycle 10 FOLFIRI 11/28/2019, bevacizumab held, Udenyca Cycle 11 FOLFIRI 12/26/2019, bevacizumab held, Udenyca CTs 01/25/2020-no evidence of recurrent disease, resolution of right lower lobe nodule Maintenance Xeloda beginning 02/06/2020 CTs 04/25/2020- no evidence of metastatic disease, multiple bilateral renal calculi without hydronephrosis Maintenance Xeloda continued CT abdomen/pelvis without contrast 08/10/2020-bilateral staghorn renal calculi, no evidence of metastatic disease; addendum 08/23/2020-small but increasing omental nodules. Cycle 1 FOLFIRI/panitumumab 09/19/2020 Cycle 2  FOLFIRI/Panitumumab 10/03/2020 Cycle 3 FOLFIRI/Panitumumab 10/17/2020 Cycle 4 FOLFIRI/panitumumab 10/31/2020 Cycle 5 FOLFIRI/Panitumumab 11/14/2020 CTs 11/26/2020-stable omental metastases compared to 10/22/2020, mildly decreased from 08/10/2020.  Left lower quadrant tiny paracolic gutter implant slightly decreased from CT 08/10/2020.  No new or progressive metastatic disease in the  abdomen or pelvis. Cycle 6 FOLFIRI/Panitumumab 11/28/2020, irinotecan dose reduced, treatment schedule adjusted to every 3 weeks going forward Cycle 7 FOLFIRI/panitumumab 12/24/2020 Cycle 8 FOLFIRI/Panitumumab 01/16/2021--treatment held due to hypertension in the infusion area. Cycle 8 FOLFIRI/panitumumab 01/21/2021 Cycle 9 FOLFIRI/panitumumab 02/11/2021 Cycle 10 FOLFIRI/Panitumumab 03/04/2021 Cycle 11 FOLFIRI/Panitumumab 03/25/2021 CT abdomen/pelvis 04/10/2021-stable right omental and left posterior paracolic gutter nodules Cycle 12 FOLFIRI/panitumumab 04/15/2021 Cycle 13 FOLFIRI/panitumumab 05/06/2021 Cycle 14 FOLFIRI/Panitumumab 05/27/2021 Cycle 15 FOLFIRI/Panitumumab 06/17/2021   Hypertension G4 P3, twins Kidney stones-bilateral staghorn renal calculi on CT 08/10/2020 Infected Port-A-Cath 09/12/2019-placed on Augmentin, referred for Port-A-Cath removal; Port-A-Cath removed 09/14/2019; PICC line placed 09/14/2019; culture staph aureus.  Course of Septra completed. Neutropenia secondary to chemotherapy-Udenyca added with cycle 8 FOLFIRI Right nephrostomy tube 09/12/2020; stent placed 10/09/2020, percutaneous nephrostolithotomy treatment of right-sided kidney stones 10/09/2020, malpositioned right ureter stent replacement 10/23/2020, right ureter stent removed 10/29/2020    Disposition: [name] appears stable.  She has completed 14 cycles of FOLFIRI/Panitumumab.  Plan to proceed with cycle 15 today as scheduled.  Restaging CTs prior to next office visit.  CBC and chemistry panel reviewed.  Labs adequate to proceed as above.  She  has mild hypokalemia.  She will resume oral potassium.  She will return for lab, follow-up, FOLFIRI/Panitumumab in 3 weeks.  We are available to see her sooner if needed.    Ned Card ANP/GNP-BC   06/17/2021  10:39 AM

## 2021-06-17 NOTE — Progress Notes (Signed)
Patient seen by Ned Card NP today  Vitals are within treatment parameters.  Labs reviewed by Ned Card NP and are within treatment parameters. She will resume taking her oral K+  Per physician team, patient is ready for treatment and there are NO modifications to the treatment plan.

## 2021-06-19 ENCOUNTER — Inpatient Hospital Stay: Payer: Medicare Other

## 2021-06-19 VITALS — BP 113/86 | HR 88 | Temp 98.6°F | Resp 18

## 2021-06-19 DIAGNOSIS — Z79899 Other long term (current) drug therapy: Secondary | ICD-10-CM | POA: Diagnosis not present

## 2021-06-19 DIAGNOSIS — R109 Unspecified abdominal pain: Secondary | ICD-10-CM | POA: Diagnosis not present

## 2021-06-19 DIAGNOSIS — Z5111 Encounter for antineoplastic chemotherapy: Secondary | ICD-10-CM | POA: Diagnosis not present

## 2021-06-19 DIAGNOSIS — I10 Essential (primary) hypertension: Secondary | ICD-10-CM | POA: Diagnosis not present

## 2021-06-19 DIAGNOSIS — C187 Malignant neoplasm of sigmoid colon: Secondary | ICD-10-CM | POA: Diagnosis not present

## 2021-06-19 DIAGNOSIS — C786 Secondary malignant neoplasm of retroperitoneum and peritoneum: Secondary | ICD-10-CM | POA: Diagnosis not present

## 2021-06-19 DIAGNOSIS — C7989 Secondary malignant neoplasm of other specified sites: Secondary | ICD-10-CM | POA: Diagnosis not present

## 2021-06-19 DIAGNOSIS — C20 Malignant neoplasm of rectum: Secondary | ICD-10-CM

## 2021-06-19 DIAGNOSIS — R21 Rash and other nonspecific skin eruption: Secondary | ICD-10-CM | POA: Diagnosis not present

## 2021-06-19 MED ORDER — SODIUM CHLORIDE 0.9% FLUSH
10.0000 mL | INTRAVENOUS | Status: DC | PRN
  Administered 2021-06-19: 10 mL

## 2021-06-19 MED ORDER — HEPARIN SOD (PORK) LOCK FLUSH 100 UNIT/ML IV SOLN
500.0000 [IU] | Freq: Once | INTRAVENOUS | Status: AC | PRN
  Administered 2021-06-19: 500 [IU]

## 2021-06-19 NOTE — Patient Instructions (Signed)
PICC Home Care Guide A peripherally inserted central catheter (PICC) is a form of IV access that allows medicines and IV fluids to be quickly put into the blood and spread throughout the body. The PICC is a long, thin, flexible tube (catheter) that is put into a vein in a person's arm or leg. The catheter ends in a large vein just outside the heart called the superior vena cava (SVC). After the PICC is put in, a chest X-ray may be done to make sure that it is in the right place. A PICC may be placed for different reasons, such as: To give medicines and liquid nutrition. To give IV fluids and blood products. To take blood samples often. If there is trouble placing a peripheral intravenous (PIV) catheter. If cared for properly, a PICC can remain in place for many months. Having a PICC can allow you to go home from the hospital sooner and continue treatment at home. Medicines and PICC care can be managed at home by a family member, caregiver, or home health care team. What are the risks? Generally, having a PICC is safe. However, problems may occur, including: A blood clot (thrombus) forming in or at the end of the PICC. A blood clot forming in a vein (deep vein thrombosis) or traveling to the lung (pulmonary embolism). Inflammation of the vein (phlebitis) in which the PICC is placed. Infection at the insertion site or in the blood. Blood infections from central lines, like PICCs, can be serious and often require a hospital stay. PICC malposition, or PICC movement or poor placement. A break or cut in the PICC. Do not use scissors near the PICC. Nerve or tendon irritation or injury during PICC insertion. How to care for your PICC Please follow the specific guidelines provided by your health care provider. Preventing infection You and any caregivers should wash your hands often with soap and water for at least 20 seconds. Wash hands: Before touching the PICC or the infusion device. Before changing a  bandage (dressing). Do not change the dressing unless you have been taught to do so and have shown you are able to change it safely. Flush the PICC as told. Tell your health care provider right away if the PICC is hard to flush or does not flush. Do not use force to flush the PICC. Use clean and germ-free (sterile) supplies only. Keep the supplies in a dry place. Do not reuse needles, syringes, or any other supplies. Reusing supplies can lead to infection. Keep the PICC dressing dry and secure it with tape if the edges stop sticking to your skin. Check your PICC insertion site every day for signs of infection. Check for: Redness, swelling, or pain. Fluid or blood. Warmth. Pus or a bad smell. Preventing other problems Do not use a syringe that is less than 10 mL to flush the PICC. Do not have your blood pressure checked on the arm in which the PICC is placed. Do not ever pull or tug on the PICC. Keep it secured to your arm with tape or a stretch wrap when not in use. Do not take the PICC out yourself. Only a trained health care provider should remove the PICC. Keep pets and children away from your PICC. How to care for your PICC dressing Keep your PICC dressing clean and dry to prevent infection. Do not take baths, swim, or use a hot tub until your health care provider approves. Ask your health care provider if you can take   showers. You may only be allowed to take sponge baths. When you are allowed to shower: Ask your health care provider to teach you how to wrap the PICC. Cover the PICC with clear plastic wrap and tape to keep it dry while showering. Follow instructions from your health care provider about how to take care of your insertion site and dressing. Make sure you: Wash your hands with soap and water for at least 20 seconds before and after you change your dressing. If soap and water are not available, use hand sanitizer. Change your dressing only if taught to do so by your health care  provider. Your PICC dressing needs to be changed if it becomes loose or wet. Leave stitches (sutures), skin glue, or adhesive strips in place. These skin closures may need to stay in place for 2 weeks or longer. If adhesive strip edges start to loosen and curl up, you may trim the loose edges. Do not remove adhesive strips completely unless your health care provider tells you to do that. Follow these instructions at home: Disposal of supplies Throw away any syringes in a disposal container that is meant for sharp items (sharps container). You can buy a sharps container from a pharmacy, or you can make one by using an empty, hard plastic bottle with a lid. Place any used dressings or infusion bags into a plastic bag. Throw that bag in the trash. General instructions  Always carry your PICC identification card or wear a medical alert bracelet. Keep the tube clamped at all times, unless it is being used. Always carry a smooth-edge clamp with you to clamp the PICC if it breaks. Do not use scissors or sharp objects near the tube. You may bend your arm and move it freely. If your PICC is near or at the bend of your elbow, avoid activity with repeated motion at the elbow. Avoid lifting heavy objects as told by your health care provider. Keep all follow-up visits. This is important. You will need to have your PICC dressing changed at least once a week. Contact a health care provider if: You have pain in your arm, ear, face, or teeth. You have a fever or chills. You have redness, swelling, or pain around the insertion site. You have fluid or blood coming from the insertion site. Your insertion site feels warm to the touch. You have pus or a bad smell coming from the insertion site. Your skin feels hard and raised around the insertion site. Your PICC dressing has gotten wet or is coming off and you have not been taught how to change it. Get help right away if: You have problems with your PICC, such as  your PICC: Was tugged or pulled and has partially come out. Do not  push the PICC back in. Cannot be flushed, is hard to flush, or leaks around the insertion site when it is flushed. Makes a flushing sound when it is flushed. Appears to have a hole or tear. Is accidentally pulled all the way out. If this happens, cover the insertion site with a gauze dressing. Do not throw the PICC away. Your health care provider will need to check it to be sure the entire catheter came out. You feel your heart racing or skipping beats, or you have chest pain. You have shortness of breath or trouble breathing. You have swelling, redness, warmth, or pain in the arm in which the PICC is placed. You have a red streak going up your arm that   starts under the PICC dressing. These symptoms may be an emergency. Get help right away. Call 911. Do not wait to see if the symptoms will go away. Do not drive yourself to the hospital. Summary A peripherally inserted central catheter (PICC) is a long, thin, flexible tube (catheter) that is put into a vein in the arm or leg. If cared for properly, a PICC can remain in place for many months. Having a PICC can allow you to go home from the hospital sooner and continue treatment at home. The PICC is inserted using a germ-free (sterile) technique by a specially trained health care provider. Only a trained health care provider should remove it. Do not have your blood pressure checked on the arm in which your PICC is placed. Always keep your PICC identification card with you. This information is not intended to replace advice given to you by your health care provider. Make sure you discuss any questions you have with your health care provider. Document Revised: 07/18/2020 Document Reviewed: 07/18/2020 Elsevier Patient Education  2023 Elsevier Inc.  

## 2021-06-24 ENCOUNTER — Other Ambulatory Visit (HOSPITAL_BASED_OUTPATIENT_CLINIC_OR_DEPARTMENT_OTHER): Payer: Self-pay

## 2021-06-24 ENCOUNTER — Inpatient Hospital Stay: Payer: Medicare Other

## 2021-06-24 VITALS — BP 168/99 | HR 94 | Temp 97.5°F | Resp 18

## 2021-06-24 DIAGNOSIS — C20 Malignant neoplasm of rectum: Secondary | ICD-10-CM

## 2021-06-24 DIAGNOSIS — C7989 Secondary malignant neoplasm of other specified sites: Secondary | ICD-10-CM | POA: Diagnosis not present

## 2021-06-24 DIAGNOSIS — R21 Rash and other nonspecific skin eruption: Secondary | ICD-10-CM | POA: Diagnosis not present

## 2021-06-24 DIAGNOSIS — Z5111 Encounter for antineoplastic chemotherapy: Secondary | ICD-10-CM | POA: Diagnosis not present

## 2021-06-24 DIAGNOSIS — Z79899 Other long term (current) drug therapy: Secondary | ICD-10-CM | POA: Diagnosis not present

## 2021-06-24 DIAGNOSIS — I10 Essential (primary) hypertension: Secondary | ICD-10-CM | POA: Diagnosis not present

## 2021-06-24 DIAGNOSIS — C187 Malignant neoplasm of sigmoid colon: Secondary | ICD-10-CM | POA: Diagnosis not present

## 2021-06-24 DIAGNOSIS — R109 Unspecified abdominal pain: Secondary | ICD-10-CM | POA: Diagnosis not present

## 2021-06-24 DIAGNOSIS — C786 Secondary malignant neoplasm of retroperitoneum and peritoneum: Secondary | ICD-10-CM | POA: Diagnosis not present

## 2021-06-24 DIAGNOSIS — Z95828 Presence of other vascular implants and grafts: Secondary | ICD-10-CM

## 2021-06-24 MED ORDER — HEPARIN SOD (PORK) LOCK FLUSH 100 UNIT/ML IV SOLN
500.0000 [IU] | Freq: Once | INTRAVENOUS | Status: AC
  Administered 2021-06-24: 500 [IU]

## 2021-06-24 MED ORDER — SODIUM CHLORIDE 0.9% FLUSH
10.0000 mL | Freq: Once | INTRAVENOUS | Status: AC
  Administered 2021-06-24: 10 mL

## 2021-06-24 NOTE — Patient Instructions (Signed)
PICC Home Care Guide A peripherally inserted central catheter (PICC) is a form of IV access that allows medicines and IV fluids to be quickly put into the blood and spread throughout the body. The PICC is a long, thin, flexible tube (catheter) that is put into a vein in a person's arm or leg. The catheter ends in a large vein just outside the heart called the superior vena cava (SVC). After the PICC is put in, a chest X-ray may be done to make sure that it is in the right place. A PICC may be placed for different reasons, such as: To give medicines and liquid nutrition. To give IV fluids and blood products. To take blood samples often. If there is trouble placing a peripheral intravenous (PIV) catheter. If cared for properly, a PICC can remain in place for many months. Having a PICC can allow you to go home from the hospital sooner and continue treatment at home. Medicines and PICC care can be managed at home by a family member, caregiver, or home health care team. What are the risks? Generally, having a PICC is safe. However, problems may occur, including: A blood clot (thrombus) forming in or at the end of the PICC. A blood clot forming in a vein (deep vein thrombosis) or traveling to the lung (pulmonary embolism). Inflammation of the vein (phlebitis) in which the PICC is placed. Infection at the insertion site or in the blood. Blood infections from central lines, like PICCs, can be serious and often require a hospital stay. PICC malposition, or PICC movement or poor placement. A break or cut in the PICC. Do not use scissors near the PICC. Nerve or tendon irritation or injury during PICC insertion. How to care for your PICC Please follow the specific guidelines provided by your health care provider. Preventing infection You and any caregivers should wash your hands often with soap and water for at least 20 seconds. Wash hands: Before touching the PICC or the infusion device. Before changing a  bandage (dressing). Do not change the dressing unless you have been taught to do so and have shown you are able to change it safely. Flush the PICC as told. Tell your health care provider right away if the PICC is hard to flush or does not flush. Do not use force to flush the PICC. Use clean and germ-free (sterile) supplies only. Keep the supplies in a dry place. Do not reuse needles, syringes, or any other supplies. Reusing supplies can lead to infection. Keep the PICC dressing dry and secure it with tape if the edges stop sticking to your skin. Check your PICC insertion site every day for signs of infection. Check for: Redness, swelling, or pain. Fluid or blood. Warmth. Pus or a bad smell. Preventing other problems Do not use a syringe that is less than 10 mL to flush the PICC. Do not have your blood pressure checked on the arm in which the PICC is placed. Do not ever pull or tug on the PICC. Keep it secured to your arm with tape or a stretch wrap when not in use. Do not take the PICC out yourself. Only a trained health care provider should remove the PICC. Keep pets and children away from your PICC. How to care for your PICC dressing Keep your PICC dressing clean and dry to prevent infection. Do not take baths, swim, or use a hot tub until your health care provider approves. Ask your health care provider if you can take   showers. You may only be allowed to take sponge baths. When you are allowed to shower: Ask your health care provider to teach you how to wrap the PICC. Cover the PICC with clear plastic wrap and tape to keep it dry while showering. Follow instructions from your health care provider about how to take care of your insertion site and dressing. Make sure you: Wash your hands with soap and water for at least 20 seconds before and after you change your dressing. If soap and water are not available, use hand sanitizer. Change your dressing only if taught to do so by your health care  provider. Your PICC dressing needs to be changed if it becomes loose or wet. Leave stitches (sutures), skin glue, or adhesive strips in place. These skin closures may need to stay in place for 2 weeks or longer. If adhesive strip edges start to loosen and curl up, you may trim the loose edges. Do not remove adhesive strips completely unless your health care provider tells you to do that. Follow these instructions at home: Disposal of supplies Throw away any syringes in a disposal container that is meant for sharp items (sharps container). You can buy a sharps container from a pharmacy, or you can make one by using an empty, hard plastic bottle with a lid. Place any used dressings or infusion bags into a plastic bag. Throw that bag in the trash. General instructions  Always carry your PICC identification card or wear a medical alert bracelet. Keep the tube clamped at all times, unless it is being used. Always carry a smooth-edge clamp with you to clamp the PICC if it breaks. Do not use scissors or sharp objects near the tube. You may bend your arm and move it freely. If your PICC is near or at the bend of your elbow, avoid activity with repeated motion at the elbow. Avoid lifting heavy objects as told by your health care provider. Keep all follow-up visits. This is important. You will need to have your PICC dressing changed at least once a week. Contact a health care provider if: You have pain in your arm, ear, face, or teeth. You have a fever or chills. You have redness, swelling, or pain around the insertion site. You have fluid or blood coming from the insertion site. Your insertion site feels warm to the touch. You have pus or a bad smell coming from the insertion site. Your skin feels hard and raised around the insertion site. Your PICC dressing has gotten wet or is coming off and you have not been taught how to change it. Get help right away if: You have problems with your PICC, such as  your PICC: Was tugged or pulled and has partially come out. Do not  push the PICC back in. Cannot be flushed, is hard to flush, or leaks around the insertion site when it is flushed. Makes a flushing sound when it is flushed. Appears to have a hole or tear. Is accidentally pulled all the way out. If this happens, cover the insertion site with a gauze dressing. Do not throw the PICC away. Your health care provider will need to check it to be sure the entire catheter came out. You feel your heart racing or skipping beats, or you have chest pain. You have shortness of breath or trouble breathing. You have swelling, redness, warmth, or pain in the arm in which the PICC is placed. You have a red streak going up your arm that   starts under the PICC dressing. These symptoms may be an emergency. Get help right away. Call 911. Do not wait to see if the symptoms will go away. Do not drive yourself to the hospital. Summary A peripherally inserted central catheter (PICC) is a long, thin, flexible tube (catheter) that is put into a vein in the arm or leg. If cared for properly, a PICC can remain in place for many months. Having a PICC can allow you to go home from the hospital sooner and continue treatment at home. The PICC is inserted using a germ-free (sterile) technique by a specially trained health care provider. Only a trained health care provider should remove it. Do not have your blood pressure checked on the arm in which your PICC is placed. Always keep your PICC identification card with you. This information is not intended to replace advice given to you by your health care provider. Make sure you discuss any questions you have with your health care provider. Document Revised: 07/18/2020 Document Reviewed: 07/18/2020 Elsevier Patient Education  2023 Elsevier Inc.  

## 2021-06-28 ENCOUNTER — Encounter: Payer: Self-pay | Admitting: Oncology

## 2021-07-01 ENCOUNTER — Inpatient Hospital Stay: Payer: Medicare Other

## 2021-07-01 ENCOUNTER — Telehealth: Payer: Self-pay

## 2021-07-01 ENCOUNTER — Ambulatory Visit (HOSPITAL_BASED_OUTPATIENT_CLINIC_OR_DEPARTMENT_OTHER): Payer: Medicare Other

## 2021-07-01 VITALS — BP 162/88 | HR 83 | Temp 98.2°F | Resp 18

## 2021-07-01 DIAGNOSIS — Z95828 Presence of other vascular implants and grafts: Secondary | ICD-10-CM

## 2021-07-01 DIAGNOSIS — C20 Malignant neoplasm of rectum: Secondary | ICD-10-CM

## 2021-07-01 DIAGNOSIS — Z5111 Encounter for antineoplastic chemotherapy: Secondary | ICD-10-CM | POA: Diagnosis not present

## 2021-07-01 DIAGNOSIS — Z452 Encounter for adjustment and management of vascular access device: Secondary | ICD-10-CM

## 2021-07-01 DIAGNOSIS — R21 Rash and other nonspecific skin eruption: Secondary | ICD-10-CM | POA: Diagnosis not present

## 2021-07-01 DIAGNOSIS — I10 Essential (primary) hypertension: Secondary | ICD-10-CM | POA: Diagnosis not present

## 2021-07-01 DIAGNOSIS — C187 Malignant neoplasm of sigmoid colon: Secondary | ICD-10-CM | POA: Diagnosis not present

## 2021-07-01 DIAGNOSIS — C786 Secondary malignant neoplasm of retroperitoneum and peritoneum: Secondary | ICD-10-CM | POA: Diagnosis not present

## 2021-07-01 DIAGNOSIS — R109 Unspecified abdominal pain: Secondary | ICD-10-CM | POA: Diagnosis not present

## 2021-07-01 DIAGNOSIS — C7989 Secondary malignant neoplasm of other specified sites: Secondary | ICD-10-CM | POA: Diagnosis not present

## 2021-07-01 DIAGNOSIS — Z79899 Other long term (current) drug therapy: Secondary | ICD-10-CM | POA: Diagnosis not present

## 2021-07-01 MED ORDER — SODIUM CHLORIDE 0.9% FLUSH
10.0000 mL | Freq: Once | INTRAVENOUS | Status: AC
  Administered 2021-07-01: 10 mL

## 2021-07-01 MED ORDER — HEPARIN SOD (PORK) LOCK FLUSH 100 UNIT/ML IV SOLN
500.0000 [IU] | Freq: Once | INTRAVENOUS | Status: AC
  Administered 2021-07-01: 500 [IU]

## 2021-07-01 NOTE — Telephone Encounter (Signed)
Called to inform patient of orders placed for Chest X-ray to verify picc line tip placement.  Informed patient she can have the x-ray completed the same day of her CT abdomen on 07/05/21. Patient verbalized understanding.

## 2021-07-01 NOTE — Patient Instructions (Signed)
PICC Home Care Guide A peripherally inserted central catheter (PICC) is a form of IV access that allows medicines and IV fluids to be quickly put into the blood and spread throughout the body. The PICC is a long, thin, flexible tube (catheter) that is put into a vein in a person's arm or leg. The catheter ends in a large vein just outside the heart called the superior vena cava (SVC). After the PICC is put in, a chest X-ray may be done to make sure that it is in the right place. A PICC may be placed for different reasons, such as: To give medicines and liquid nutrition. To give IV fluids and blood products. To take blood samples often. If there is trouble placing a peripheral intravenous (PIV) catheter. If cared for properly, a PICC can remain in place for many months. Having a PICC can allow you to go home from the hospital sooner and continue treatment at home. Medicines and PICC care can be managed at home by a family member, caregiver, or home health care team. What are the risks? Generally, having a PICC is safe. However, problems may occur, including: A blood clot (thrombus) forming in or at the end of the PICC. A blood clot forming in a vein (deep vein thrombosis) or traveling to the lung (pulmonary embolism). Inflammation of the vein (phlebitis) in which the PICC is placed. Infection at the insertion site or in the blood. Blood infections from central lines, like PICCs, can be serious and often require a hospital stay. PICC malposition, or PICC movement or poor placement. A break or cut in the PICC. Do not use scissors near the PICC. Nerve or tendon irritation or injury during PICC insertion. How to care for your PICC Please follow the specific guidelines provided by your health care provider. Preventing infection You and any caregivers should wash your hands often with soap and water for at least 20 seconds. Wash hands: Before touching the PICC or the infusion device. Before changing a  bandage (dressing). Do not change the dressing unless you have been taught to do so and have shown you are able to change it safely. Flush the PICC as told. Tell your health care provider right away if the PICC is hard to flush or does not flush. Do not use force to flush the PICC. Use clean and germ-free (sterile) supplies only. Keep the supplies in a dry place. Do not reuse needles, syringes, or any other supplies. Reusing supplies can lead to infection. Keep the PICC dressing dry and secure it with tape if the edges stop sticking to your skin. Check your PICC insertion site every day for signs of infection. Check for: Redness, swelling, or pain. Fluid or blood. Warmth. Pus or a bad smell. Preventing other problems Do not use a syringe that is less than 10 mL to flush the PICC. Do not have your blood pressure checked on the arm in which the PICC is placed. Do not ever pull or tug on the PICC. Keep it secured to your arm with tape or a stretch wrap when not in use. Do not take the PICC out yourself. Only a trained health care provider should remove the PICC. Keep pets and children away from your PICC. How to care for your PICC dressing Keep your PICC dressing clean and dry to prevent infection. Do not take baths, swim, or use a hot tub until your health care provider approves. Ask your health care provider if you can take   showers. You may only be allowed to take sponge baths. When you are allowed to shower: Ask your health care provider to teach you how to wrap the PICC. Cover the PICC with clear plastic wrap and tape to keep it dry while showering. Follow instructions from your health care provider about how to take care of your insertion site and dressing. Make sure you: Wash your hands with soap and water for at least 20 seconds before and after you change your dressing. If soap and water are not available, use hand sanitizer. Change your dressing only if taught to do so by your health care  provider. Your PICC dressing needs to be changed if it becomes loose or wet. Leave stitches (sutures), skin glue, or adhesive strips in place. These skin closures may need to stay in place for 2 weeks or longer. If adhesive strip edges start to loosen and curl up, you may trim the loose edges. Do not remove adhesive strips completely unless your health care provider tells you to do that. Follow these instructions at home: Disposal of supplies Throw away any syringes in a disposal container that is meant for sharp items (sharps container). You can buy a sharps container from a pharmacy, or you can make one by using an empty, hard plastic bottle with a lid. Place any used dressings or infusion bags into a plastic bag. Throw that bag in the trash. General instructions  Always carry your PICC identification card or wear a medical alert bracelet. Keep the tube clamped at all times, unless it is being used. Always carry a smooth-edge clamp with you to clamp the PICC if it breaks. Do not use scissors or sharp objects near the tube. You may bend your arm and move it freely. If your PICC is near or at the bend of your elbow, avoid activity with repeated motion at the elbow. Avoid lifting heavy objects as told by your health care provider. Keep all follow-up visits. This is important. You will need to have your PICC dressing changed at least once a week. Contact a health care provider if: You have pain in your arm, ear, face, or teeth. You have a fever or chills. You have redness, swelling, or pain around the insertion site. You have fluid or blood coming from the insertion site. Your insertion site feels warm to the touch. You have pus or a bad smell coming from the insertion site. Your skin feels hard and raised around the insertion site. Your PICC dressing has gotten wet or is coming off and you have not been taught how to change it. Get help right away if: You have problems with your PICC, such as  your PICC: Was tugged or pulled and has partially come out. Do not  push the PICC back in. Cannot be flushed, is hard to flush, or leaks around the insertion site when it is flushed. Makes a flushing sound when it is flushed. Appears to have a hole or tear. Is accidentally pulled all the way out. If this happens, cover the insertion site with a gauze dressing. Do not throw the PICC away. Your health care provider will need to check it to be sure the entire catheter came out. You feel your heart racing or skipping beats, or you have chest pain. You have shortness of breath or trouble breathing. You have swelling, redness, warmth, or pain in the arm in which the PICC is placed. You have a red streak going up your arm that   starts under the PICC dressing. These symptoms may be an emergency. Get help right away. Call 911. Do not wait to see if the symptoms will go away. Do not drive yourself to the hospital. Summary A peripherally inserted central catheter (PICC) is a long, thin, flexible tube (catheter) that is put into a vein in the arm or leg. If cared for properly, a PICC can remain in place for many months. Having a PICC can allow you to go home from the hospital sooner and continue treatment at home. The PICC is inserted using a germ-free (sterile) technique by a specially trained health care provider. Only a trained health care provider should remove it. Do not have your blood pressure checked on the arm in which your PICC is placed. Always keep your PICC identification card with you. This information is not intended to replace advice given to you by your health care provider. Make sure you discuss any questions you have with your health care provider. Document Revised: 07/18/2020 Document Reviewed: 07/18/2020 Elsevier Patient Education  2023 Elsevier Inc.  

## 2021-07-01 NOTE — Progress Notes (Signed)
Patient had PICC dressing change today with flush.  Dressing noted to be taped on the top and the edges loose. Biopatch had dark drainage and wet, dressing around bio patch was loose.  Patient states that she doesn't know why the bio patch looks like that.  Excess catheter noted to be 7 cm last documented was  06/11/21 at 5 cm's.  Blood return from Lumen #2 only. Dr. Gearldine Shown nurse Santiago Glad notified for possible chest ct with Friday's ct of abdomen.

## 2021-07-05 ENCOUNTER — Encounter (HOSPITAL_COMMUNITY): Payer: Self-pay

## 2021-07-05 ENCOUNTER — Ambulatory Visit (HOSPITAL_COMMUNITY)
Admission: RE | Admit: 2021-07-05 | Discharge: 2021-07-05 | Disposition: A | Discharge status: Home / Self Care | Payer: Medicare Other | Source: Ambulatory Visit | Attending: Oncology | Admitting: Oncology

## 2021-07-05 ENCOUNTER — Ambulatory Visit (HOSPITAL_COMMUNITY)
Admission: RE | Admit: 2021-07-05 | Discharge: 2021-07-05 | Disposition: A | Discharge status: Home / Self Care | Payer: Medicare Other | Source: Ambulatory Visit | Attending: Nurse Practitioner | Admitting: Nurse Practitioner

## 2021-07-05 DIAGNOSIS — C20 Malignant neoplasm of rectum: Secondary | ICD-10-CM | POA: Diagnosis not present

## 2021-07-05 DIAGNOSIS — Z452 Encounter for adjustment and management of vascular access device: Secondary | ICD-10-CM | POA: Diagnosis not present

## 2021-07-05 DIAGNOSIS — I700 Atherosclerosis of aorta: Secondary | ICD-10-CM | POA: Diagnosis not present

## 2021-07-05 DIAGNOSIS — R1030 Lower abdominal pain, unspecified: Secondary | ICD-10-CM | POA: Diagnosis not present

## 2021-07-05 MED ORDER — SODIUM CHLORIDE (PF) 0.9 % IJ SOLN
INTRAMUSCULAR | Status: AC
  Filled 2021-07-05: qty 50

## 2021-07-05 MED ORDER — IOHEXOL 300 MG/ML  SOLN
100.0000 mL | Freq: Once | INTRAMUSCULAR | Status: AC | PRN
  Administered 2021-07-05: 100 mL via INTRAVENOUS

## 2021-07-06 ENCOUNTER — Other Ambulatory Visit (HOSPITAL_BASED_OUTPATIENT_CLINIC_OR_DEPARTMENT_OTHER): Payer: Medicare Other

## 2021-07-07 ENCOUNTER — Other Ambulatory Visit: Payer: Self-pay | Admitting: Oncology

## 2021-07-08 ENCOUNTER — Inpatient Hospital Stay: Payer: Medicare Other

## 2021-07-08 ENCOUNTER — Other Ambulatory Visit: Payer: Self-pay | Admitting: *Deleted

## 2021-07-08 ENCOUNTER — Other Ambulatory Visit: Payer: Self-pay | Admitting: Oncology

## 2021-07-08 ENCOUNTER — Inpatient Hospital Stay (HOSPITAL_BASED_OUTPATIENT_CLINIC_OR_DEPARTMENT_OTHER): Payer: Medicare Other | Admitting: Oncology

## 2021-07-08 VITALS — BP 150/90 | HR 100 | Temp 98.2°F | Resp 18 | Ht 65.0 in | Wt 172.8 lb

## 2021-07-08 DIAGNOSIS — Z79899 Other long term (current) drug therapy: Secondary | ICD-10-CM | POA: Diagnosis not present

## 2021-07-08 DIAGNOSIS — C20 Malignant neoplasm of rectum: Secondary | ICD-10-CM

## 2021-07-08 DIAGNOSIS — R109 Unspecified abdominal pain: Secondary | ICD-10-CM | POA: Diagnosis not present

## 2021-07-08 DIAGNOSIS — C19 Malignant neoplasm of rectosigmoid junction: Secondary | ICD-10-CM

## 2021-07-08 DIAGNOSIS — I10 Essential (primary) hypertension: Secondary | ICD-10-CM | POA: Diagnosis not present

## 2021-07-08 DIAGNOSIS — C786 Secondary malignant neoplasm of retroperitoneum and peritoneum: Secondary | ICD-10-CM | POA: Diagnosis not present

## 2021-07-08 DIAGNOSIS — C7989 Secondary malignant neoplasm of other specified sites: Secondary | ICD-10-CM | POA: Diagnosis not present

## 2021-07-08 DIAGNOSIS — Z5111 Encounter for antineoplastic chemotherapy: Secondary | ICD-10-CM | POA: Diagnosis not present

## 2021-07-08 DIAGNOSIS — R21 Rash and other nonspecific skin eruption: Secondary | ICD-10-CM | POA: Diagnosis not present

## 2021-07-08 DIAGNOSIS — C187 Malignant neoplasm of sigmoid colon: Secondary | ICD-10-CM | POA: Diagnosis not present

## 2021-07-08 LAB — MAGNESIUM: Magnesium: 1.7 mg/dL (ref 1.7–2.4)

## 2021-07-08 LAB — CBC WITH DIFFERENTIAL (CANCER CENTER ONLY)
Abs Immature Granulocytes: 0.01 10*3/uL (ref 0.00–0.07)
Basophils Absolute: 0.1 10*3/uL (ref 0.0–0.1)
Basophils Relative: 1 %
Eosinophils Absolute: 0.3 10*3/uL (ref 0.0–0.5)
Eosinophils Relative: 6 %
HCT: 43.3 % (ref 36.0–46.0)
Hemoglobin: 14.2 g/dL (ref 12.0–15.0)
Immature Granulocytes: 0 %
Lymphocytes Relative: 53 %
Lymphs Abs: 2.9 10*3/uL (ref 0.7–4.0)
MCH: 31.1 pg (ref 26.0–34.0)
MCHC: 32.8 g/dL (ref 30.0–36.0)
MCV: 95 fL (ref 80.0–100.0)
Monocytes Absolute: 0.6 10*3/uL (ref 0.1–1.0)
Monocytes Relative: 11 %
Neutro Abs: 1.6 10*3/uL — ABNORMAL LOW (ref 1.7–7.7)
Neutrophils Relative %: 29 %
Platelet Count: 260 10*3/uL (ref 150–400)
RBC: 4.56 MIL/uL (ref 3.87–5.11)
RDW: 13.5 % (ref 11.5–15.5)
WBC Count: 5.6 10*3/uL (ref 4.0–10.5)
nRBC: 0 % (ref 0.0–0.2)

## 2021-07-08 LAB — CMP (CANCER CENTER ONLY)
ALT: 10 U/L (ref 0–44)
AST: 14 U/L — ABNORMAL LOW (ref 15–41)
Albumin: 4.5 g/dL (ref 3.5–5.0)
Alkaline Phosphatase: 167 U/L — ABNORMAL HIGH (ref 38–126)
Anion gap: 12 (ref 5–15)
BUN: 8 mg/dL (ref 6–20)
CO2: 27 mmol/L (ref 22–32)
Calcium: 10 mg/dL (ref 8.9–10.3)
Chloride: 102 mmol/L (ref 98–111)
Creatinine: 0.77 mg/dL (ref 0.44–1.00)
GFR, Estimated: >60 mL/min (ref >60)
Glucose, Bld: 130 mg/dL — ABNORMAL HIGH (ref 70–99)
Potassium: 3.2 mmol/L — ABNORMAL LOW (ref 3.5–5.1)
Sodium: 141 mmol/L (ref 135–145)
Total Bilirubin: 0.2 mg/dL — ABNORMAL LOW (ref 0.3–1.2)
Total Protein: 7.5 g/dL (ref 6.5–8.1)

## 2021-07-08 LAB — CEA (ACCESS): CEA (CHCC): 2.27 ng/mL (ref 0.00–5.00)

## 2021-07-08 MED ORDER — HYDROCODONE-ACETAMINOPHEN 5-325 MG PO TABS: 1.0000 | ORAL_TABLET | Freq: Four times a day (QID) | ORAL | 0 refills | Status: DC | PRN

## 2021-07-10 ENCOUNTER — Inpatient Hospital Stay: Payer: Medicare Other

## 2021-07-18 ENCOUNTER — Encounter: Payer: Self-pay | Admitting: Oncology

## 2021-07-18 ENCOUNTER — Ambulatory Visit (INDEPENDENT_AMBULATORY_CARE_PROVIDER_SITE_OTHER): Payer: Medicare Other | Admitting: Nurse Practitioner

## 2021-07-18 ENCOUNTER — Encounter (HOSPITAL_BASED_OUTPATIENT_CLINIC_OR_DEPARTMENT_OTHER): Payer: Self-pay | Admitting: Nurse Practitioner

## 2021-07-18 ENCOUNTER — Other Ambulatory Visit (HOSPITAL_BASED_OUTPATIENT_CLINIC_OR_DEPARTMENT_OTHER): Payer: Self-pay

## 2021-07-18 VITALS — BP 122/82 | HR 71 | Ht 64.0 in | Wt 175.4 lb

## 2021-07-18 DIAGNOSIS — K5909 Other constipation: Secondary | ICD-10-CM

## 2021-07-18 DIAGNOSIS — C20 Malignant neoplasm of rectum: Secondary | ICD-10-CM | POA: Diagnosis not present

## 2021-07-18 DIAGNOSIS — I10 Essential (primary) hypertension: Secondary | ICD-10-CM

## 2021-07-18 MED ORDER — TRULANCE 3 MG PO TABS
ORAL_TABLET | ORAL | 3 refills | Status: DC
  Filled 2021-07-18: qty 30, 30d supply, fill #0

## 2021-07-18 MED ORDER — METOPROLOL TARTRATE 25 MG PO TABS
25.0000 mg | ORAL_TABLET | Freq: Two times a day (BID) | ORAL | 3 refills | Status: DC
  Filled 2021-07-18: qty 60, 30d supply, fill #0
  Filled 2021-10-28: qty 60, 30d supply, fill #1

## 2021-07-18 MED ORDER — HYDRALAZINE HCL 10 MG PO TABS
10.0000 mg | ORAL_TABLET | Freq: Three times a day (TID) | ORAL | 3 refills | Status: DC
  Filled 2021-07-18: qty 90, 30d supply, fill #0
  Filled 2021-10-28: qty 17, 6d supply, fill #1
  Filled 2021-10-28: qty 73, 24d supply, fill #1

## 2021-07-18 MED ORDER — HYDROCHLOROTHIAZIDE 25 MG PO TABS
25.0000 mg | ORAL_TABLET | Freq: Every day | ORAL | 3 refills | Status: DC
  Filled 2021-07-18: qty 30, 30d supply, fill #0
  Filled 2021-10-28: qty 30, 30d supply, fill #1

## 2021-07-18 MED ORDER — AMLODIPINE BESYLATE 10 MG PO TABS
10.0000 mg | ORAL_TABLET | Freq: Every day | ORAL | 6 refills | Status: DC
  Filled 2021-07-18: qty 30, 30d supply, fill #0
  Filled 2021-10-28: qty 30, 30d supply, fill #1

## 2021-07-18 NOTE — Assessment & Plan Note (Signed)
Chronic.  No abnormal findings on cardiovascular examination.  Blood pressures are within the target range.  No new symptoms or signs of complications. Recommend continuation of current medication. Recommend regular physical activity with 150 minutes of cardiovascular exercise per week, low-sodium diet, smoking fixation, and weight management for optimal blood pressure control. Recommend at home monitoring of blood pressure and notify if readings are above 130/80.  Goal blood pressure less than 120/80. Review of recent labs from oncology today. Plan to follow-up in 6 months or sooner if needed.

## 2021-07-18 NOTE — Patient Instructions (Addendum)
It was a pleasure seeing you today. I hope your time spent with [place] was pleasant and helpful. Please let us know if there is anything we can do to improve the service you receive.   I want to talk to Dr. Benay Spice to see what his thoughts are about the symptoms you are having with the constipation. It seems to me that follow-up with GI may be helpful, but he would know better than me at this point.   I have sent in the Trulance for you- you can take this every other day or even farther apart for constipation.     Important Office Information Lab Results If labs were ordered, please note that you will see results through [place] as soon as they come available from [place].  It takes up to 5 business days for the results to be routed to me and for me to review them once all of the lab results have come through from [facility]. I will make recommendations based on your results and send these through [place] or someone from the office will call you to discuss. If your labs are abnormal, we may contact you to schedule a visit to discuss the results and make recommendations.  If you have not heard from [place] within 5 business days or you have waited longer than a week and your lab results have not come through on [place], please feel free to call the office or send a message through [place] to follow-up on these labs.   Referrals If referrals were placed today, the office where the referral was sent will contact you either by phone or through [place] to set up scheduling. Please note that it can take up to a week for the referral office to contact you. If you do not hear from them in a week, please contact the referral office directly to inquire about scheduling.   Condition Treated If your condition worsens or you begin to have new symptoms, please schedule a follow-up appointment for further evaluation. If you are not sure if an appointment is needed, you may call the office to leave a message for the nurse  and someone will contact you with recommendations.  If you have an urgent or life threatening emergency, please do not call the office, but seek emergency evaluation by calling 911 or going to the nearest emergency room for evaluation.   MyChart and Phone Calls Please do not use MyChart for urgent messages. It may take up to 3 business days for MyChart messages to be read by staff and if they are unable to handle the request, an additional 3 business days for them to be routed to me and for my response.  Messages sent to the provider through [place] do not come directly to the provider, please allow time for these messages to be routed and for me to respond.  We get a large volume of MyChart messages daily and these are responded to in the order received.   For urgent messages, please call the office at [phone] and speak with the front office staff or leave a message on the line of my assistant for guidance.  We are seeing patients from the hours of 8:00 am through 5:00 pm and calls directly to the nurse may not be answered immediately due to seeing patients, but your call will be returned as soon as possible.  Phone  messages received after 4:00 PM Monday through Thursday may not be returned until the following business day. Phone  messages received after 11:00 AM on Friday may not be returned until Monday.   After Hours We share on call hours with providers from other offices. If you have an urgent need after hours that cannot wait until the next business day, please contact the on call provider by calling the office number. A nurse will speak with you and contact the provider if needed for recommendations.  If you have an urgent or life threatening emergency after hours, please do not call the on call provider, but seek emergency evaluation by calling 911 or going to the nearest emergency room for evaluation.   Paperwork All paperwork requires a minimum of 5 days to complete and return to you  or the designated personnel. Please keep this in mind when bringing in forms or sending requests for paperwork completion to the office.

## 2021-07-18 NOTE — Assessment & Plan Note (Signed)
Currently followed closely with oncology with Dr. Benay Spice.  Currently on a break from chemotherapy.  Recent imaging reviewed with patient today. She is having concerns with constipation, unclear if these are related to previous surgery, cancer, treatments, or idiopathic. Plan to contact Dr. Malachy Mood to further discuss this concern and determine recommendations from the oncology standpoint.   Her abdomen is quite distended on exam today and she is experiencing tenderness in the lower quadrants, otherwise appears stable. Recent labs reviewed today with patient.  We will plan to follow-up once I had a chance to speak with oncology. Continue to follow along and provide supportive care as needed with oncology.

## 2021-07-18 NOTE — Progress Notes (Signed)
April Keeler, DNP, AGNP-c [address] [place], [place] [id number] [phone] Office [phone] Fax  ESTABLISHED PATIENT- Chronic Health and/or Follow-Up Visit  Blood pressure 122/82, pulse 71, height '5\' 4"'$  (1.626 m), weight 175 lb 6.4 oz (79.6 kg), last menstrual period 01/07/2006, SpO2 94 %.  Follow-up (Patient presents today for follow up HTN. Patient is concerned with constipation.)   HPI  April [name]  is a 46 y.o. year old female presenting today for evaluation and management of the following: HTN Blood pressure doing well No chest pain, shortness of breath, swelling in the ankles, vision changes Occasional dizziness, primarily when she has been out sitting in the sun or straining for a BM.  Tolerating medication well Recent labs with oncology Constipation Chronic since having her surgery for cancer She tells me that she must use a stool softener or laxative to go- recently she has been using Trulance immediately after chemotherapy because this is when it is most difficult to go.  She was prescribed Linzess in the past, but the Trulance was given to her by her sister.  She tells me that her abdomen is tender and full feeling and she often feels like her intestines are moving and pushing, but the stool is "plugged" inside somewhere.   She reports when she does take the Trulance she will feel a sharp pain in the lower left quadrant and a feeling of "building  When she takes the laxative (Trulance) usually has a bowel movement in an "explosion" followed by liquid She feels like her body is always trying to push to have a bowel movement She had her last treatment on 06/19 She has concerns that there may be scar tissue or some kind of sticture in the sigmoid colon that is blocking the stool She reports a sensation of blockage She reports a sharp pain in the left lower quadrant that feels like a balloon piling up  when she takes the laxative then everything rushes through That is usually when she gets dizzy trying to strain  ROS All ROS negative with exception of what is listed in HPI  PHYSICAL EXAM Physical Exam Vitals and nursing note reviewed.  Constitutional:      Appearance: Normal appearance.  HENT:     Head: Normocephalic.  Eyes:     Extraocular Movements: Extraocular movements intact.  Neck:     Vascular: No carotid bruit.  Cardiovascular:     Rate and Rhythm: Normal rate and regular rhythm.     Pulses: Normal pulses.     Heart sounds: Normal heart sounds.  Pulmonary:     Effort: Pulmonary effort is normal.     Breath sounds: Normal breath sounds.  Abdominal:     General: There is distension.     Tenderness: There is abdominal tenderness. There is no right CVA tenderness, left CVA tenderness, guarding or rebound.  Musculoskeletal:     Right lower leg: No edema.     Left lower leg: No edema.  Skin:    General: Skin is warm and dry.     Capillary Refill: Capillary refill takes less than 2 seconds.  Neurological:     General: No focal deficit present.     Mental Status: She is alert and oriented to person, place, and time.  Psychiatric:        Mood and Affect: Mood normal.        Behavior: Behavior normal.  Thought Content: Thought content normal.        Judgment: Judgment normal.     ASSESSMENT & PLAN Problem List Items Addressed This Visit     Hypertension - Primary    Chronic.  No abnormal findings on cardiovascular examination.  Blood pressures are within the target range.  No new symptoms or signs of complications. Recommend continuation of current medication. Recommend regular physical activity with 150 minutes of cardiovascular exercise per week, low-sodium diet, smoking fixation, and weight management for optimal blood pressure control. Recommend at home monitoring of blood pressure and notify if readings are above 130/80.  Goal blood pressure less than  120/80. Review of recent labs from oncology today. Plan to follow-up in 6 months or sooner if needed.       Relevant Medications   metoprolol tartrate (LOPRESSOR) 25 MG tablet   hydrochlorothiazide (HYDRODIURIL) 25 MG tablet   hydrALAZINE (APRESOLINE) 10 MG tablet   amLODipine (NORVASC) 10 MG tablet   Rectal cancer (HCC)    Currently followed closely with oncology with Dr. Benay Spice.  Currently on a break from chemotherapy.  Recent imaging reviewed with patient today. She is having concerns with constipation, unclear if these are related to previous surgery, cancer, treatments, or idiopathic. Plan to contact Dr. Malachy Mood to further discuss this concern and determine recommendations from the oncology standpoint.   Her abdomen is quite distended on exam today and she is experiencing tenderness in the lower quadrants, otherwise appears stable. Recent labs reviewed today with patient.  We will plan to follow-up once I had a chance to speak with oncology. Continue to follow along and provide supportive care as needed with oncology.      Relevant Medications   Plecanatide (TRULANCE) 3 MG TABS   hydrochlorothiazide (HYDRODIURIL) 25 MG tablet   Other constipation    Chronic constipation.  At this time it is unclear if this is related to her chemotherapy, her cancer, surgical interventions for the cancer, or idiopathic in origin.  She is experiencing some concerning symptoms with described sensations of blockage and increased urgency.  She has been taking Trulance given to her by a family member as needed to help facilitate a bowel movement after chemotherapy treatments.   We will send prescription for this medication for her to take to avoid use of other medications. Instructions to avoid taking the medication on a daily basis if it is causing diarrhea and to ensure she is staying well-hydrated while on the medication. We will plan to discuss with Dr. Malachy Mood her symptoms and concerns for guidance on  recommendations for further follow-up.  We will plan to reach back out to patient once we have had the opportunity to talk. Imaging and labs from oncology reviewed today. We will plan to follow-up in 6 months or sooner if needed.      Relevant Medications   Plecanatide (TRULANCE) 3 MG TABS     FOLLOW-UP Return in about 6 months (around 01/18/2022) for Chronic Care.   April Keeler, DNP, AGNP-c 07/18/2021  8:47 AM

## 2021-07-18 NOTE — Assessment & Plan Note (Signed)
Chronic constipation.  At this time it is unclear if this is related to her chemotherapy, her cancer, surgical interventions for the cancer, or idiopathic in origin.  She is experiencing some concerning symptoms with described sensations of blockage and increased urgency.  She has been taking Trulance given to her by a family member as needed to help facilitate a bowel movement after chemotherapy treatments.   We will send prescription for this medication for her to take to avoid use of other medications. Instructions to avoid taking the medication on a daily basis if it is causing diarrhea and to ensure she is staying well-hydrated while on the medication. We will plan to discuss with Dr. Malachy Mood her symptoms and concerns for guidance on recommendations for further follow-up.  We will plan to reach back out to patient once we have had the opportunity to talk. Imaging and labs from oncology reviewed today. We will plan to follow-up in 6 months or sooner if needed.

## 2021-07-19 ENCOUNTER — Telehealth (HOSPITAL_BASED_OUTPATIENT_CLINIC_OR_DEPARTMENT_OTHER): Payer: Self-pay | Admitting: Nurse Practitioner

## 2021-07-19 ENCOUNTER — Telehealth: Payer: Self-pay

## 2021-07-19 ENCOUNTER — Other Ambulatory Visit: Payer: Self-pay | Admitting: Nurse Practitioner

## 2021-07-19 DIAGNOSIS — C19 Malignant neoplasm of rectosigmoid junction: Secondary | ICD-10-CM

## 2021-07-19 MED ORDER — HYDROCODONE-ACETAMINOPHEN 5-325 MG PO TABS: 1.0000 | ORAL_TABLET | Freq: Four times a day (QID) | ORAL | 0 refills | Status: DC | PRN

## 2021-07-19 NOTE — Telephone Encounter (Signed)
April [name] called in and request [place] to send her Norco/Vicodin to [facility] on [address]. I placed the requested on the Np's desk. I call  [facility] on [address] to close out the prescription that was send on 07/11/21. I left a message on [name] vm let her know the new prescription will be fill by the end of the day.

## 2021-07-19 NOTE — Telephone Encounter (Signed)
Fax received from [facility] program with Virgie Dad, APP from 06/26/2021 assessment. Information abstracted into chart and files to be scanned.

## 2021-07-29 DIAGNOSIS — N3281 Overactive bladder: Secondary | ICD-10-CM | POA: Diagnosis not present

## 2021-07-29 DIAGNOSIS — R301 Vesical tenesmus: Secondary | ICD-10-CM | POA: Diagnosis not present

## 2021-07-29 DIAGNOSIS — N200 Calculus of kidney: Secondary | ICD-10-CM | POA: Diagnosis not present

## 2021-07-29 DIAGNOSIS — R1012 Left upper quadrant pain: Secondary | ICD-10-CM | POA: Diagnosis not present

## 2021-07-29 DIAGNOSIS — R1031 Right lower quadrant pain: Secondary | ICD-10-CM | POA: Diagnosis not present

## 2021-08-01 DIAGNOSIS — N200 Calculus of kidney: Secondary | ICD-10-CM

## 2021-08-05 ENCOUNTER — Other Ambulatory Visit: Payer: Self-pay

## 2021-08-06 DIAGNOSIS — N200 Calculus of kidney: Secondary | ICD-10-CM | POA: Diagnosis not present

## 2021-08-15 ENCOUNTER — Telehealth: Payer: Self-pay

## 2021-08-15 NOTE — Telephone Encounter (Signed)
April [name] called in and requested a refill of her norco/vicodin, placed on the np's desk

## 2021-08-16 ENCOUNTER — Other Ambulatory Visit: Payer: Self-pay | Admitting: Nurse Practitioner

## 2021-08-16 DIAGNOSIS — C19 Malignant neoplasm of rectosigmoid junction: Secondary | ICD-10-CM

## 2021-08-16 MED ORDER — HYDROCODONE-ACETAMINOPHEN 5-325 MG PO TABS: 1.0000 | ORAL_TABLET | Freq: Four times a day (QID) | ORAL | 0 refills | Status: DC | PRN

## 2021-08-19 ENCOUNTER — Encounter: Payer: Self-pay | Admitting: Nurse Practitioner

## 2021-08-19 ENCOUNTER — Inpatient Hospital Stay (HOSPITAL_BASED_OUTPATIENT_CLINIC_OR_DEPARTMENT_OTHER): Payer: Medicare Other | Admitting: Nurse Practitioner

## 2021-08-19 ENCOUNTER — Inpatient Hospital Stay: Payer: Medicare Other | Attending: Oncology

## 2021-08-19 VITALS — BP 150/80 | HR 82 | Temp 98.1°F | Resp 18 | Ht 64.0 in | Wt 176.4 lb

## 2021-08-19 DIAGNOSIS — I10 Essential (primary) hypertension: Secondary | ICD-10-CM | POA: Diagnosis not present

## 2021-08-19 DIAGNOSIS — C20 Malignant neoplasm of rectum: Secondary | ICD-10-CM

## 2021-08-19 DIAGNOSIS — C187 Malignant neoplasm of sigmoid colon: Secondary | ICD-10-CM | POA: Diagnosis not present

## 2021-08-19 DIAGNOSIS — C19 Malignant neoplasm of rectosigmoid junction: Secondary | ICD-10-CM | POA: Diagnosis not present

## 2021-08-19 DIAGNOSIS — N200 Calculus of kidney: Secondary | ICD-10-CM | POA: Insufficient documentation

## 2021-08-19 DIAGNOSIS — R109 Unspecified abdominal pain: Secondary | ICD-10-CM | POA: Diagnosis not present

## 2021-08-19 LAB — CBC WITH DIFFERENTIAL (CANCER CENTER ONLY)
Abs Immature Granulocytes: 0.01 10*3/uL (ref 0.00–0.07)
Basophils Absolute: 0.1 10*3/uL (ref 0.0–0.1)
Basophils Relative: 1 %
Eosinophils Absolute: 1.1 10*3/uL — ABNORMAL HIGH (ref 0.0–0.5)
Eosinophils Relative: 17 %
HCT: 39.9 % (ref 36.0–46.0)
Hemoglobin: 13.3 g/dL (ref 12.0–15.0)
Immature Granulocytes: 0 %
Lymphocytes Relative: 42 %
Lymphs Abs: 2.6 10*3/uL (ref 0.7–4.0)
MCH: 31.4 pg (ref 26.0–34.0)
MCHC: 33.3 g/dL (ref 30.0–36.0)
MCV: 94.1 fL (ref 80.0–100.0)
Monocytes Absolute: 0.4 10*3/uL (ref 0.1–1.0)
Monocytes Relative: 7 %
Neutro Abs: 2.1 10*3/uL (ref 1.7–7.7)
Neutrophils Relative %: 33 %
Platelet Count: 315 10*3/uL (ref 150–400)
RBC: 4.24 MIL/uL (ref 3.87–5.11)
RDW: 14 % (ref 11.5–15.5)
WBC Count: 6.3 10*3/uL (ref 4.0–10.5)
nRBC: 0 % (ref 0.0–0.2)

## 2021-08-19 LAB — CEA (ACCESS): CEA (CHCC): 3.4 ng/mL (ref 0.00–5.00)

## 2021-08-19 LAB — MAGNESIUM: Magnesium: 1.8 mg/dL (ref 1.7–2.4)

## 2021-08-19 NOTE — Progress Notes (Signed)
[facility] [place] OFFICE PROGRESS NOTE   Diagnosis: Colon cancer  INTERVAL HISTORY:   Ms. Perritt returns as scheduled.  She completed a cycle of FOLFIRI/Panitumumab 06/17/2021.  Restaging CTs 07/05/2021 overall stable.  She requested a treatment break.  She reports she is undergoing evaluation for recurrent kidney stones.  She notes a "nagging ache" at the upper rectum.  She is having regular bowel movements without the aid of a laxative, no bleeding.  She continues to have intermittent low anterior abdominal pain, feels like cramps.  Appetite is "okay".  No nausea or vomiting.  Objective:  Vital signs in last 24 hours:  Blood pressure (!) 150/80, pulse 82, temperature 98.1 F (36.7 C), temperature source Oral, resp. rate 18, height '5\' 4"'  (1.626 m), weight 176 lb 6.4 oz (80 kg), last menstrual period 01/07/2006, SpO2 100 %.    HEENT: No thrush or ulcers. Lymphatics: No palpable cervical, supraclavicular, axillary or inguinal lymph nodes. Resp: Lungs clear bilaterally. Cardio: Regular rate and rhythm. GI: Abdomen is soft, mild tenderness right abdomen.  No hepatomegaly.  No mass. Vascular: No leg edema.   Lab Results:  Lab Results  Component Value Date   WBC 6.3 08/19/2021   HGB 13.3 08/19/2021   HCT 39.9 08/19/2021   MCV 94.1 08/19/2021   PLT 315 08/19/2021   NEUTROABS 2.1 08/19/2021    Imaging:  No results found.  Medications: I have reviewed the patient's current medications.  Assessment/Plan: Sigmoid colon cancer, stage IV (pT4a,pN2b,M1c) Colonoscopy 03/24/2019-3 rectal polyps-hyperplastic polyps, distal colon biopsy-at least intramucosal adenocarcinoma, completely obstructing mass in the distal sigmoid colon, could not be traversed, intact mismatch repair protein expression 03/24/2019-CEA 56.1 03/29/2019 CT abdomen/pelvis-circumferential thickening involving the entire mid and distal sigmoid colon to the level of the rectosigmoid junction, solid/cystic  mass in the left ovary, bilateral nephrolithiasis Robotic assisted low anterior resection, mesenteric lymphadenectomy, bilateral salpingo-oophorectomy 04/08/2019 Pathology ([facility] review of outside pathology) metastatic adenocarcinoma involving the peritoneum overlying the round ligament, serosa of the urinary bladder, serosa of the right ureter a sacral area, pelvic peritoneum, and left ovary.  Omental biopsy with focal mucin pools with no carcinoma cells identified, right hemidiaphragm biopsy involved by metastatic adenocarcinoma, invasive adenocarcinoma the sigmoid colon, moderately differentiated, T4a, perineural and vascular invasion present, 7/22 lymph nodes, multiple tumor deposits, resection margins negative Negative for PD-L1, low probability of MSI-high, HER-2 negative, negative for BRAF, NRAS and KRAS alterations CTs 05/19/2019-no evidence of metastatic disease, findings suspicious for colitis of the transverse and ascending colon, small amount of ascites in the cul-de-sac, bilateral renal calculi Cycle 1 FOLFIRI 05/24/2019, bevacizumab added with cycle 2 Cycle 4 FOLFIRI/bevacizumab 07/05/2019 Cycle 5 FOLFIRI 07/27/2019, bevacizumab held secondary to hypertension Cycle 6 FOLFIRI 08/10/1999, bevacizumab held secondary to hypertension CTs 08/30/2019-no evidence of recurrent disease, new subsolid right lower lobe nodule felt to be inflammatory, emphysema Cycle 7 FOLFIRI 09/26/2019, bevacizumab remains on hold secondary to hypertension Cycle 8 FOLFIRI 10/17/2019, bevacizumab held secondary to hypertension, Udenyca added for neutropenia Cycle 9 FOLFIRI 11/07/2019, bevacizumab held secondary to hypertension, Udenyca Cycle 10 FOLFIRI 11/28/2019, bevacizumab held, Udenyca Cycle 11 FOLFIRI 12/26/2019, bevacizumab held, Udenyca CTs 01/25/2020-no evidence of recurrent disease, resolution of right lower lobe nodule Maintenance Xeloda beginning 02/06/2020 CTs 04/25/2020- no evidence of metastatic disease, multiple  bilateral renal calculi without hydronephrosis Maintenance Xeloda continued CT abdomen/pelvis without contrast 08/10/2020-bilateral staghorn renal calculi, no evidence of metastatic disease; addendum 08/23/2020-small but increasing omental nodules. Cycle 1 FOLFIRI/panitumumab 09/19/2020 Cycle 2 FOLFIRI/Panitumumab 10/03/2020 Cycle 3 FOLFIRI/Panitumumab  10/17/2020 Cycle 4 FOLFIRI/panitumumab 10/31/2020 Cycle 5 FOLFIRI/Panitumumab 11/14/2020 CTs 11/26/2020-stable omental metastases compared to 10/22/2020, mildly decreased from 08/10/2020.  Left lower quadrant tiny paracolic gutter implant slightly decreased from CT 08/10/2020.  No new or progressive metastatic disease in the abdomen or pelvis. Cycle 6 FOLFIRI/Panitumumab 11/28/2020, irinotecan dose reduced, treatment schedule adjusted to every 3 weeks going forward Cycle 7 FOLFIRI/panitumumab 12/24/2020 Cycle 8 FOLFIRI/Panitumumab 01/16/2021--treatment held due to hypertension in the infusion area. Cycle 8 FOLFIRI/panitumumab 01/21/2021 Cycle 9 FOLFIRI/panitumumab 02/11/2021 Cycle 10 FOLFIRI/Panitumumab 03/04/2021 Cycle 11 FOLFIRI/Panitumumab 03/25/2021 CT abdomen/pelvis 04/10/2021-stable right omental and left posterior paracolic gutter nodules Cycle 12 FOLFIRI/panitumumab 04/15/2021 Cycle 13 FOLFIRI/panitumumab 05/06/2021 Cycle 14 FOLFIRI/Panitumumab 05/27/2021 Cycle 15 FOLFIRI/Panitumumab 06/17/2021 CT abdomen/pelvis 07/05/2021-slight enlargement of a dominant right omental implant, no new implants Patient requested a treatment break   Hypertension G4 P3, twins Kidney stones-bilateral staghorn renal calculi on CT 08/10/2020 Infected Port-A-Cath 09/12/2019-placed on Augmentin, referred for Port-A-Cath removal; Port-A-Cath removed 09/14/2019; PICC line placed 09/14/2019; culture staph aureus.  Course of Septra completed. Neutropenia secondary to chemotherapy-Udenyca added with cycle 8 FOLFIRI Right nephrostomy tube 09/12/2020; stent placed 10/09/2020, percutaneous  nephrostolithotomy treatment of right-sided kidney stones 10/09/2020, malpositioned right ureter stent replacement 10/23/2020, right ureter stent removed 10/29/2020      Disposition: Ms. Mulkern has metastatic colon cancer, currently on a treatment break.  Most recent CTs were consistent with overall stable disease.  We will follow-up on the CEA from today.  She will contact the office with worsening pain at the abdomen or rectum.  Overall plan is for restaging CTs in approximately 6 weeks.  She reports undergoing evaluation for recurrent kidney stones.  She will contact our office once a plan for management has been established.  She will return for follow-up in 3 weeks.  We are available to see her sooner if needed.    Ned Card ANP/GNP-BC   08/19/2021  10:43 AM

## 2021-08-23 ENCOUNTER — Telehealth: Payer: Self-pay

## 2021-08-23 NOTE — Telephone Encounter (Signed)
Patient called in to inform us her surgery date at [facility] on the 09-30-21 for her percutaneous nephrolithotomy.

## 2021-08-28 ENCOUNTER — Other Ambulatory Visit (HOSPITAL_BASED_OUTPATIENT_CLINIC_OR_DEPARTMENT_OTHER): Payer: Self-pay

## 2021-09-09 ENCOUNTER — Inpatient Hospital Stay (HOSPITAL_BASED_OUTPATIENT_CLINIC_OR_DEPARTMENT_OTHER): Payer: Medicare Other | Admitting: Nurse Practitioner

## 2021-09-09 ENCOUNTER — Encounter: Payer: Self-pay | Admitting: Nurse Practitioner

## 2021-09-09 VITALS — BP 154/86 | HR 93 | Temp 97.6°F | Resp 17 | Wt 170.6 lb

## 2021-09-09 DIAGNOSIS — I10 Essential (primary) hypertension: Secondary | ICD-10-CM | POA: Diagnosis not present

## 2021-09-09 DIAGNOSIS — C19 Malignant neoplasm of rectosigmoid junction: Secondary | ICD-10-CM

## 2021-09-09 DIAGNOSIS — R109 Unspecified abdominal pain: Secondary | ICD-10-CM | POA: Diagnosis not present

## 2021-09-09 DIAGNOSIS — C187 Malignant neoplasm of sigmoid colon: Secondary | ICD-10-CM | POA: Diagnosis not present

## 2021-09-09 DIAGNOSIS — N200 Calculus of kidney: Secondary | ICD-10-CM | POA: Diagnosis not present

## 2021-09-09 MED ORDER — HYDROCODONE-ACETAMINOPHEN 5-325 MG PO TABS: 1.0000 | ORAL_TABLET | Freq: Four times a day (QID) | ORAL | 0 refills | Status: DC | PRN

## 2021-09-09 NOTE — Progress Notes (Signed)
[place] OFFICE PROGRESS NOTE   Diagnosis: Colon cancer  INTERVAL HISTORY:   April [name] returns as scheduled.  She is currently on a treatment break.  She notes improvement in bowel movements but continues to have discomfort at what she describes as the upper rectum.  No bleeding.  No nausea or vomiting.  Objective:  Vital signs in last 24 hours:  Blood pressure (!) 154/86, pulse 93, temperature 97.6 F (36.4 C), temperature source Tympanic, resp. rate 17, weight 170 lb 9.6 oz (77.4 kg), last menstrual period 01/07/2006, SpO2 100 %.    HEENT: No thrush or ulcers. Resp: Lungs clear bilaterally. Cardio: Regular rate and rhythm. GI: Abdomen soft with mild generalized tenderness.  No hepatosplenomegaly.  No mass. Vascular: No leg edema.  Lab Results:  Lab Results  Component Value Date   WBC 6.3 08/19/2021   HGB 13.3 08/19/2021   HCT 39.9 08/19/2021   MCV 94.1 08/19/2021   PLT 315 08/19/2021   NEUTROABS 2.1 08/19/2021    Imaging:  No results found.  Medications: I have reviewed the patient's current medications.  Assessment/Plan: Sigmoid colon cancer, stage IV (pT4a,pN2b,M1c) Colonoscopy 03/24/2019-3 rectal polyps-hyperplastic polyps, distal colon biopsy-at least intramucosal adenocarcinoma, completely obstructing mass in the distal sigmoid colon, could not be traversed, intact mismatch repair protein expression 03/24/2019-CEA 56.1 03/29/2019 CT abdomen/pelvis-circumferential thickening involving the entire mid and distal sigmoid colon to the level of the rectosigmoid junction, solid/cystic mass in the left ovary, bilateral nephrolithiasis Robotic assisted low anterior resection, mesenteric lymphadenectomy, bilateral salpingo-oophorectomy 04/08/2019 Pathology ([facility] review of outside pathology) metastatic adenocarcinoma involving the peritoneum overlying the round ligament, serosa of the urinary bladder, serosa of the right ureter a sacral area, pelvic  peritoneum, and left ovary.  Omental biopsy with focal mucin pools with no carcinoma cells identified, right hemidiaphragm biopsy involved by metastatic adenocarcinoma, invasive adenocarcinoma the sigmoid colon, moderately differentiated, T4a, perineural and vascular invasion present, 7/22 lymph nodes, multiple tumor deposits, resection margins negative Negative for PD-L1, low probability of MSI-high, HER-2 negative, negative for BRAF, NRAS and KRAS alterations CTs 05/19/2019-no evidence of metastatic disease, findings suspicious for colitis of the transverse and ascending colon, small amount of ascites in the cul-de-sac, bilateral renal calculi Cycle 1 FOLFIRI 05/24/2019, bevacizumab added with cycle 2 Cycle 4 FOLFIRI/bevacizumab 07/05/2019 Cycle 5 FOLFIRI 07/27/2019, bevacizumab held secondary to hypertension Cycle 6 FOLFIRI 08/10/1999, bevacizumab held secondary to hypertension CTs 08/30/2019-no evidence of recurrent disease, new subsolid right lower lobe nodule felt to be inflammatory, emphysema Cycle 7 FOLFIRI 09/26/2019, bevacizumab remains on hold secondary to hypertension Cycle 8 FOLFIRI 10/17/2019, bevacizumab held secondary to hypertension, Udenyca added for neutropenia Cycle 9 FOLFIRI 11/07/2019, bevacizumab held secondary to hypertension, Udenyca Cycle 10 FOLFIRI 11/28/2019, bevacizumab held, Udenyca Cycle 11 FOLFIRI 12/26/2019, bevacizumab held, Udenyca CTs 01/25/2020-no evidence of recurrent disease, resolution of right lower lobe nodule Maintenance Xeloda beginning 02/06/2020 CTs 04/25/2020- no evidence of metastatic disease, multiple bilateral renal calculi without hydronephrosis Maintenance Xeloda continued CT abdomen/pelvis without contrast 08/10/2020-bilateral staghorn renal calculi, no evidence of metastatic disease; addendum 08/23/2020-small but increasing omental nodules. Cycle 1 FOLFIRI/panitumumab 09/19/2020 Cycle 2 FOLFIRI/Panitumumab 10/03/2020 Cycle 3 FOLFIRI/Panitumumab  10/17/2020 Cycle 4 FOLFIRI/panitumumab 10/31/2020 Cycle 5 FOLFIRI/Panitumumab 11/14/2020 CTs 11/26/2020-stable omental metastases compared to 10/22/2020, mildly decreased from 08/10/2020.  Left lower quadrant tiny paracolic gutter implant slightly decreased from CT 08/10/2020.  No new or progressive metastatic disease in the abdomen or pelvis. Cycle 6 FOLFIRI/Panitumumab 11/28/2020, irinotecan dose reduced, treatment schedule adjusted to every 3 weeks  going forward Cycle 7 FOLFIRI/panitumumab 12/24/2020 Cycle 8 FOLFIRI/Panitumumab 01/16/2021--treatment held due to hypertension in the infusion area. Cycle 8 FOLFIRI/panitumumab 01/21/2021 Cycle 9 FOLFIRI/panitumumab 02/11/2021 Cycle 10 FOLFIRI/Panitumumab 03/04/2021 Cycle 11 FOLFIRI/Panitumumab 03/25/2021 CT abdomen/pelvis 04/10/2021-stable right omental and left posterior paracolic gutter nodules Cycle 12 FOLFIRI/panitumumab 04/15/2021 Cycle 13 FOLFIRI/panitumumab 05/06/2021 Cycle 14 FOLFIRI/Panitumumab 05/27/2021 Cycle 15 FOLFIRI/Panitumumab 06/17/2021 CT abdomen/pelvis 07/05/2021-slight enlargement of a dominant right omental implant, no new implants Patient requested a treatment break   Hypertension G4 P3, twins Kidney stones-bilateral staghorn renal calculi on CT 08/10/2020 Infected Port-A-Cath 09/12/2019-placed on Augmentin, referred for Port-A-Cath removal; Port-A-Cath removed 09/14/2019; PICC line placed 09/14/2019; culture staph aureus.  Course of Septra completed. Neutropenia secondary to chemotherapy-Udenyca added with cycle 8 FOLFIRI Right nephrostomy tube 09/12/2020; stent placed 10/09/2020, percutaneous nephrostolithotomy treatment of right-sided kidney stones 10/09/2020, malpositioned right ureter stent replacement 10/23/2020, right ureter stent removed 10/29/2020      Disposition: April [name] appears unchanged.  She is on a treatment break at her request.  Plan for restaging CTs in the next few weeks.  We will see her in follow-up on 10/07/2021.  We  are available to see her sooner if needed.  Refill for hydrocodone sent to her pharmacy.    Ned Card ANP/GNP-BC   09/09/2021  10:55 AM

## 2021-09-16 ENCOUNTER — Other Ambulatory Visit: Payer: Self-pay | Admitting: Oncology

## 2021-09-16 NOTE — Progress Notes (Signed)
OFF PATHWAY REGIMEN - Colorectal  No Change  Continue With Treatment as Ordered.  Original Decision Date/Time: 09/07/2020 07:26   [id number]:FOLFIRI + Panitumumab (Leucovorin IV D1 + Fluorouracil IV D1/CIV D1,2 + Irinotecan IV D1 + Panitumumab IV D1) q14 Days:   A cycle is every 14 days:     Panitumumab      Irinotecan      Leucovorin      Fluorouracil      Fluorouracil   **Always confirm dose/schedule in your pharmacy ordering system**  Patient Characteristics: Distant Metastases, Nonsurgical Candidate, KRAS/NRAS Wild-Type (BRAF V600 Wild-Type/Unknown), Standard Cytotoxic Therapy, First Line Standard Cytotoxic Therapy, Left-sided Tumor, PS = 0,1 Tumor Location: Colon Therapeutic Status: Distant Metastases Microsatellite/Mismatch Repair Status: MSS/pMMR BRAF Mutation Status: Wild-Type (no mutation) KRAS/NRAS Mutation Status: Wild-Type (no mutation) Preferred Therapy Approach: Standard Cytotoxic Therapy Standard Cytotoxic Line of Therapy: First Line Standard Cytotoxic Therapy ECOG Performance Status: 1 Primary Tumor Location: Left-sided Tumor Intent of Therapy: Non-Curative / Palliative Intent, Discussed with Patient

## 2021-09-19 ENCOUNTER — Telehealth: Payer: Self-pay | Admitting: *Deleted

## 2021-09-19 NOTE — Telephone Encounter (Signed)
Requesting to be referred to April [name] and office needs to send referral in.  Confirmed w/NP that would be best to get CT results before going to GI and patient agrees to this plan. CT scan was moved to 9/22 at 5 pm with 4:30 pm arrival. Lab here now at 3 pm. She will be NPO after 1 pm and drink contrast at 3 pm and 4 pm (already has it). She will call if not out of the hospital in time to do scan. Having surgery at [facility] on 9/18.

## 2021-09-30 DIAGNOSIS — K219 Gastro-esophageal reflux disease without esophagitis: Secondary | ICD-10-CM | POA: Diagnosis not present

## 2021-09-30 DIAGNOSIS — Z9889 Other specified postprocedural states: Secondary | ICD-10-CM | POA: Diagnosis not present

## 2021-09-30 DIAGNOSIS — F17210 Nicotine dependence, cigarettes, uncomplicated: Secondary | ICD-10-CM | POA: Diagnosis not present

## 2021-09-30 DIAGNOSIS — N135 Crossing vessel and stricture of ureter without hydronephrosis: Secondary | ICD-10-CM | POA: Diagnosis not present

## 2021-09-30 DIAGNOSIS — N200 Calculus of kidney: Secondary | ICD-10-CM | POA: Diagnosis not present

## 2021-09-30 DIAGNOSIS — N3281 Overactive bladder: Secondary | ICD-10-CM | POA: Diagnosis not present

## 2021-09-30 DIAGNOSIS — I10 Essential (primary) hypertension: Secondary | ICD-10-CM | POA: Diagnosis not present

## 2021-09-30 DIAGNOSIS — Z87442 Personal history of urinary calculi: Secondary | ICD-10-CM | POA: Diagnosis not present

## 2021-09-30 DIAGNOSIS — Z85038 Personal history of other malignant neoplasm of large intestine: Secondary | ICD-10-CM | POA: Diagnosis not present

## 2021-10-01 ENCOUNTER — Other Ambulatory Visit (HOSPITAL_BASED_OUTPATIENT_CLINIC_OR_DEPARTMENT_OTHER): Payer: Medicare Other

## 2021-10-01 DIAGNOSIS — Z87442 Personal history of urinary calculi: Secondary | ICD-10-CM | POA: Diagnosis not present

## 2021-10-01 DIAGNOSIS — K219 Gastro-esophageal reflux disease without esophagitis: Secondary | ICD-10-CM | POA: Diagnosis not present

## 2021-10-01 DIAGNOSIS — N200 Calculus of kidney: Secondary | ICD-10-CM | POA: Diagnosis not present

## 2021-10-01 DIAGNOSIS — I10 Essential (primary) hypertension: Secondary | ICD-10-CM | POA: Diagnosis not present

## 2021-10-01 DIAGNOSIS — N3281 Overactive bladder: Secondary | ICD-10-CM | POA: Diagnosis not present

## 2021-10-01 DIAGNOSIS — Z9889 Other specified postprocedural states: Secondary | ICD-10-CM | POA: Diagnosis not present

## 2021-10-01 DIAGNOSIS — Z85038 Personal history of other malignant neoplasm of large intestine: Secondary | ICD-10-CM | POA: Diagnosis not present

## 2021-10-01 DIAGNOSIS — F17210 Nicotine dependence, cigarettes, uncomplicated: Secondary | ICD-10-CM | POA: Diagnosis not present

## 2021-10-01 DIAGNOSIS — N135 Crossing vessel and stricture of ureter without hydronephrosis: Secondary | ICD-10-CM | POA: Diagnosis not present

## 2021-10-02 DIAGNOSIS — K219 Gastro-esophageal reflux disease without esophagitis: Secondary | ICD-10-CM | POA: Diagnosis not present

## 2021-10-02 DIAGNOSIS — Z9889 Other specified postprocedural states: Secondary | ICD-10-CM | POA: Diagnosis not present

## 2021-10-02 DIAGNOSIS — F17210 Nicotine dependence, cigarettes, uncomplicated: Secondary | ICD-10-CM | POA: Diagnosis not present

## 2021-10-02 DIAGNOSIS — N135 Crossing vessel and stricture of ureter without hydronephrosis: Secondary | ICD-10-CM | POA: Diagnosis not present

## 2021-10-02 DIAGNOSIS — N3281 Overactive bladder: Secondary | ICD-10-CM | POA: Diagnosis not present

## 2021-10-02 DIAGNOSIS — I10 Essential (primary) hypertension: Secondary | ICD-10-CM | POA: Diagnosis not present

## 2021-10-02 DIAGNOSIS — Z85038 Personal history of other malignant neoplasm of large intestine: Secondary | ICD-10-CM | POA: Diagnosis not present

## 2021-10-02 DIAGNOSIS — Z87442 Personal history of urinary calculi: Secondary | ICD-10-CM | POA: Diagnosis not present

## 2021-10-04 ENCOUNTER — Inpatient Hospital Stay: Payer: Medicare Other | Attending: Oncology

## 2021-10-04 ENCOUNTER — Telehealth: Payer: Self-pay | Admitting: *Deleted

## 2021-10-04 ENCOUNTER — Ambulatory Visit (HOSPITAL_BASED_OUTPATIENT_CLINIC_OR_DEPARTMENT_OTHER)
Admission: RE | Admit: 2021-10-04 | Discharge: 2021-10-04 | Disposition: A | Discharge status: Home / Self Care | Payer: Medicare Other | Source: Ambulatory Visit | Attending: Nurse Practitioner | Admitting: Nurse Practitioner

## 2021-10-04 DIAGNOSIS — C7989 Secondary malignant neoplasm of other specified sites: Secondary | ICD-10-CM | POA: Insufficient documentation

## 2021-10-04 DIAGNOSIS — C786 Secondary malignant neoplasm of retroperitoneum and peritoneum: Secondary | ICD-10-CM | POA: Insufficient documentation

## 2021-10-04 DIAGNOSIS — K5900 Constipation, unspecified: Secondary | ICD-10-CM | POA: Insufficient documentation

## 2021-10-04 DIAGNOSIS — C19 Malignant neoplasm of rectosigmoid junction: Secondary | ICD-10-CM

## 2021-10-04 DIAGNOSIS — I10 Essential (primary) hypertension: Secondary | ICD-10-CM | POA: Insufficient documentation

## 2021-10-04 DIAGNOSIS — R9431 Abnormal electrocardiogram [ECG] [EKG]: Secondary | ICD-10-CM | POA: Insufficient documentation

## 2021-10-04 DIAGNOSIS — Z87442 Personal history of urinary calculi: Secondary | ICD-10-CM | POA: Insufficient documentation

## 2021-10-04 DIAGNOSIS — E876 Hypokalemia: Secondary | ICD-10-CM

## 2021-10-04 DIAGNOSIS — C7962 Secondary malignant neoplasm of left ovary: Secondary | ICD-10-CM | POA: Insufficient documentation

## 2021-10-04 DIAGNOSIS — C189 Malignant neoplasm of colon, unspecified: Secondary | ICD-10-CM | POA: Diagnosis not present

## 2021-10-04 DIAGNOSIS — N200 Calculus of kidney: Secondary | ICD-10-CM | POA: Diagnosis not present

## 2021-10-04 DIAGNOSIS — C187 Malignant neoplasm of sigmoid colon: Secondary | ICD-10-CM | POA: Insufficient documentation

## 2021-10-04 LAB — CBC WITH DIFFERENTIAL (CANCER CENTER ONLY)
Abs Immature Granulocytes: 0.04 10*3/uL (ref 0.00–0.07)
Basophils Absolute: 0.1 10*3/uL (ref 0.0–0.1)
Basophils Relative: 1 %
Eosinophils Absolute: 0.1 10*3/uL (ref 0.0–0.5)
Eosinophils Relative: 1 %
HCT: 44.1 % (ref 36.0–46.0)
Hemoglobin: 15 g/dL (ref 12.0–15.0)
Immature Granulocytes: 0 %
Lymphocytes Relative: 31 %
Lymphs Abs: 3.2 10*3/uL (ref 0.7–4.0)
MCH: 30.7 pg (ref 26.0–34.0)
MCHC: 34 g/dL (ref 30.0–36.0)
MCV: 90.2 fL (ref 80.0–100.0)
Monocytes Absolute: 0.9 10*3/uL (ref 0.1–1.0)
Monocytes Relative: 9 %
Neutro Abs: 6.1 10*3/uL (ref 1.7–7.7)
Neutrophils Relative %: 58 %
Platelet Count: 299 10*3/uL (ref 150–400)
RBC: 4.89 MIL/uL (ref 3.87–5.11)
RDW: 13.2 % (ref 11.5–15.5)
WBC Count: 10.3 10*3/uL (ref 4.0–10.5)
nRBC: 0 % (ref 0.0–0.2)

## 2021-10-04 LAB — CMP (CANCER CENTER ONLY)
ALT: 8 U/L (ref 0–44)
AST: 15 U/L (ref 15–41)
Albumin: 4.7 g/dL (ref 3.5–5.0)
Alkaline Phosphatase: 110 U/L (ref 38–126)
Anion gap: 14 (ref 5–15)
BUN: 30 mg/dL — ABNORMAL HIGH (ref 6–20)
CO2: 33 mmol/L — ABNORMAL HIGH (ref 22–32)
Calcium: 11 mg/dL — ABNORMAL HIGH (ref 8.9–10.3)
Chloride: 89 mmol/L — ABNORMAL LOW (ref 98–111)
Creatinine: 1.18 mg/dL — ABNORMAL HIGH (ref 0.44–1.00)
GFR, Estimated: 57 mL/min — ABNORMAL LOW (ref >60)
Glucose, Bld: 232 mg/dL — ABNORMAL HIGH (ref 70–99)
Potassium: 3.1 mmol/L — ABNORMAL LOW (ref 3.5–5.1)
Sodium: 136 mmol/L (ref 135–145)
Total Bilirubin: 0.4 mg/dL (ref 0.3–1.2)
Total Protein: 8.5 g/dL — ABNORMAL HIGH (ref 6.5–8.1)

## 2021-10-04 LAB — CEA (ACCESS): CEA (CHCC): 5.5 ng/mL — ABNORMAL HIGH (ref 0.00–5.00)

## 2021-10-04 MED ORDER — IOHEXOL 300 MG/ML  SOLN
100.0000 mL | Freq: Once | INTRAMUSCULAR | Status: AC | PRN
  Administered 2021-10-04: 100 mL via INTRAVENOUS

## 2021-10-04 MED ORDER — POTASSIUM CHLORIDE CRYS ER 20 MEQ PO TBCR: 20.0000 meq | EXTENDED_RELEASE_TABLET | Freq: Every day | ORAL | 1 refills | Status: DC

## 2021-10-04 NOTE — Telephone Encounter (Signed)
Notified of low K+ and Dr. Benay Spice wants her to start KCL 20 meq daily. Confirmed pharmacy. She will come at 1030 on Monday for labs prior to [place].

## 2021-10-07 ENCOUNTER — Inpatient Hospital Stay: Payer: Medicare Other

## 2021-10-07 ENCOUNTER — Other Ambulatory Visit: Payer: Self-pay

## 2021-10-07 ENCOUNTER — Other Ambulatory Visit (HOSPITAL_COMMUNITY): Payer: Self-pay

## 2021-10-07 ENCOUNTER — Telehealth: Payer: Self-pay | Admitting: Pharmacist

## 2021-10-07 ENCOUNTER — Inpatient Hospital Stay (HOSPITAL_BASED_OUTPATIENT_CLINIC_OR_DEPARTMENT_OTHER): Payer: Medicare Other | Admitting: Oncology

## 2021-10-07 ENCOUNTER — Telehealth: Payer: Self-pay | Admitting: Pharmacy Technician

## 2021-10-07 VITALS — BP 140/90 | HR 104 | Temp 98.1°F | Resp 18 | Ht 64.0 in | Wt 161.6 lb

## 2021-10-07 DIAGNOSIS — C786 Secondary malignant neoplasm of retroperitoneum and peritoneum: Secondary | ICD-10-CM | POA: Diagnosis not present

## 2021-10-07 DIAGNOSIS — C187 Malignant neoplasm of sigmoid colon: Secondary | ICD-10-CM | POA: Diagnosis not present

## 2021-10-07 DIAGNOSIS — C20 Malignant neoplasm of rectum: Secondary | ICD-10-CM

## 2021-10-07 DIAGNOSIS — E876 Hypokalemia: Secondary | ICD-10-CM

## 2021-10-07 DIAGNOSIS — R9431 Abnormal electrocardiogram [ECG] [EKG]: Secondary | ICD-10-CM | POA: Diagnosis not present

## 2021-10-07 DIAGNOSIS — I10 Essential (primary) hypertension: Secondary | ICD-10-CM | POA: Diagnosis not present

## 2021-10-07 DIAGNOSIS — C7962 Secondary malignant neoplasm of left ovary: Secondary | ICD-10-CM | POA: Diagnosis not present

## 2021-10-07 DIAGNOSIS — K5900 Constipation, unspecified: Secondary | ICD-10-CM | POA: Diagnosis not present

## 2021-10-07 DIAGNOSIS — Z87442 Personal history of urinary calculi: Secondary | ICD-10-CM | POA: Diagnosis not present

## 2021-10-07 DIAGNOSIS — C7989 Secondary malignant neoplasm of other specified sites: Secondary | ICD-10-CM | POA: Diagnosis not present

## 2021-10-07 LAB — CMP (CANCER CENTER ONLY)
ALT: 9 U/L (ref 0–44)
AST: 14 U/L — ABNORMAL LOW (ref 15–41)
Albumin: 4.3 g/dL (ref 3.5–5.0)
Alkaline Phosphatase: 114 U/L (ref 38–126)
Anion gap: 12 (ref 5–15)
BUN: 16 mg/dL (ref 6–20)
CO2: 29 mmol/L (ref 22–32)
Calcium: 9.6 mg/dL (ref 8.9–10.3)
Chloride: 95 mmol/L — ABNORMAL LOW (ref 98–111)
Creatinine: 0.68 mg/dL (ref 0.44–1.00)
GFR, Estimated: >60 mL/min (ref >60)
Glucose, Bld: 112 mg/dL — ABNORMAL HIGH (ref 70–99)
Potassium: 3.6 mmol/L (ref 3.5–5.1)
Sodium: 136 mmol/L (ref 135–145)
Total Bilirubin: 0.3 mg/dL (ref 0.3–1.2)
Total Protein: 6.8 g/dL (ref 6.5–8.1)

## 2021-10-07 MED ORDER — LONSURF 20-8.19 MG PO TABS
ORAL_TABLET | ORAL | 0 refills | Status: DC
  Filled 2021-10-07: qty 50, 28d supply, fill #0

## 2021-10-07 MED ORDER — HYDROCODONE-ACETAMINOPHEN 10-325 MG PO TABS: 1.0000 | ORAL_TABLET | Freq: Four times a day (QID) | ORAL | 0 refills | Status: DC | PRN

## 2021-10-07 MED ORDER — LONSURF 20-8.19 MG PO TABS
ORAL_TABLET | ORAL | 0 refills | Status: DC
  Filled 2021-10-07: qty 60, fill #0

## 2021-10-07 NOTE — Telephone Encounter (Signed)
Oral Chemotherapy Pharmacist Encounter  Patient will pick up April [name] from [facility] on 10/09/21. She knows she will get started on 10/14/21.   Patient Education I spoke with patient for overview of new oral chemotherapy medication: Lonsurf (trifluridine/tipiracil)  for the treatment of metastatic colorectal cancer in conjunction with bevacizumab, planned duration until disease progression or unacceptable drug toxicity  Pt is doing well. Counseled patient on administration, dosing, side effects, monitoring, drug-food interactions, safe handling, storage, and disposal. Patient will take Take 3 tablets (60 mg trifluridine) in AM and 2 tablets (40 mg trifluridine) in PM. Take within 1 hr after AM & PM meals on days 1-5, 8-12. Repeat every 28 days.  Side effects include but not limited to: fatigue, diarrhea, nausea, decreased wbc.    Reviewed with patient importance of keeping a medication schedule and plan for any missed doses.  After discussion with patient no patient barriers to medication adherence identified.   April [name] voiced understanding and appreciation. All questions answered. Medication handout and calendar provided.  Provided patient with [facility] phone number. Patient knows to call the office with questions or concerns. Oral [facility] will continue to follow.  Darl Pikes, PharmD, BCPS, BCOP, CPP Hematology/Oncology Clinical Pharmacist Practitioner [place]/DB/AP [facility] [phone]  10/07/2021 1:45 PM

## 2021-10-07 NOTE — Progress Notes (Signed)
DISCONTINUE OFF PATHWAY REGIMEN - Colorectal   [id number]:FOLFIRI + Panitumumab (Leucovorin IV D1 + Fluorouracil IV D1/CIV D1,2 + Irinotecan IV D1 + Panitumumab IV D1) q14 Days:   A cycle is every 14 days:     Panitumumab      Irinotecan      Leucovorin      Fluorouracil      Fluorouracil   **Always confirm dose/schedule in your pharmacy ordering system**  REASON: Disease Progression PRIOR TREATMENT: Off Pathway: FOLFIRI + Panitumumab (Leucovorin IV D1 + Fluorouracil IV D1/CIV D1,2 + Irinotecan IV D1 + Panitumumab IV D1) q14 Days TREATMENT RESPONSE: Stable Disease (SD)  START ON PATHWAY REGIMEN - Colorectal     A cycle is every 28 days:     Trifluridine and tipiracil      Bevacizumab-xxxx   **Always confirm dose/schedule in your pharmacy ordering system**  Patient Characteristics: Distant Metastases, Nonsurgical Candidate, KRAS/NRAS Wild-Type (BRAF V600 Wild-Type/Unknown), Standard Cytotoxic Therapy, Third Line Standard Cytotoxic Therapy, Prior Anti-EGFR Therapy Tumor Location: Colon Therapeutic Status: Distant Metastases Microsatellite/Mismatch Repair Status: MSS/pMMR BRAF Mutation Status: Wild-Type (no mutation) KRAS/NRAS Mutation Status: Wild-Type (no mutation) Preferred Therapy Approach: Standard Cytotoxic Therapy Standard Cytotoxic Line of Therapy: Third Building services engineer Cytotoxic Therapy Intent of Therapy: Non-Curative / Palliative Intent, Discussed with Patient

## 2021-10-07 NOTE — Progress Notes (Signed)
[place] OFFICE PROGRESS NOTE   Diagnosis: Colon cancer  INTERVAL HISTORY:   April [name] returns as scheduled.  She reports increased abdominal discomfort.  The pain is not relieved with hydrocodone.  She has constipation and increased "gas ".  MiraLAX upsets her stomach.  She takes plecanatide tied intermittently for relief of constipation.  She underwent percutaneous nephrostolithotomy by Dr. Amalia Hailey on 10/01/2021.  The nephroscopy did not reveal an intrarenal stone burden.  A left ureter stent was left in place.   Objective:  Vital signs in last 24 hours:  Blood pressure (!) 140/90, pulse (!) 104, temperature 98.1 F (36.7 C), temperature source Oral, resp. rate 18, height _0  (1.626 m), weight 161 lb 9.6 oz (73.3 kg), last menstrual period 01/07/2006.    Resp: Lungs clear bilaterally Cardio: Regular rate and rhythm GI: No hepatosplenomegaly, no mass, nontender Vascular: No leg edema   Lab Results:  Lab Results  Component Value Date   WBC 10.3 10/04/2021   HGB 15.0 10/04/2021   HCT 44.1 10/04/2021   MCV 90.2 10/04/2021   PLT 299 10/04/2021   NEUTROABS 6.1 10/04/2021    CMP  Lab Results  Component Value Date   NA 136 10/07/2021   K 3.6 10/07/2021   CL 95 (L) 10/07/2021   CO2 29 10/07/2021   GLUCOSE 112 (H) 10/07/2021   BUN 16 10/07/2021   CREATININE 0.68 10/07/2021   CALCIUM 9.6 10/07/2021   PROT 6.8 10/07/2021   ALBUMIN 4.3 10/07/2021   AST 14 (L) 10/07/2021   ALT 9 10/07/2021   ALKPHOS 114 10/07/2021   BILITOT 0.3 10/07/2021   GFRNONAA >60 10/07/2021   GFRAA >60 10/17/2019    Lab Results  Component Value Date   CEA1 8.66 (H) 07/02/2020   CEA 5.50 (H) 10/04/2021      Imaging:  CT Abdomen Pelvis W Contrast  Result Date: 10/06/2021 CLINICAL DATA:  Follow-up metastatic colon carcinoma. Assess treatment response. * Tracking Code: BO * EXAM: CT ABDOMEN AND PELVIS WITH CONTRAST TECHNIQUE: Multidetector CT imaging of the abdomen  and pelvis was performed using the standard protocol following bolus administration of intravenous contrast. RADIATION DOSE REDUCTION: This exam was performed according to the departmental dose-optimization program which includes automated exposure control, adjustment of the mA and/or kV according to patient size and/or use of iterative reconstruction technique. CONTRAST:  168m OMNIPAQUE IOHEXOL 300 MG/ML  SOLN COMPARISON:  07/05/2021 FINDINGS: Lower Chest: No acute findings. Hepatobiliary: No hepatic masses identified. Focal fatty infiltration again seen adjacent to the falciform ligament. Gallbladder is unremarkable. No evidence of biliary ductal dilatation. Pancreas:  No mass or inflammatory changes. Spleen: Within normal limits in size and appearance. Adrenals/Urinary Tract: Bilateral renal calculi are again seen. There has been placement of a left ureteral stent in appropriate position. A 4 mm calculus is now seen adjacent to the stent in the distal left ureter, however there is no evidence of hydronephrosis. Urothelial thickening and enhancement is seen involving the renal pelvis and a wedge-shaped area of decreased parenchymal enhancement is seen in the lateral midpole of the left kidney which may represent pyelonephritis or infarct. Stomach/Bowel: Mild diffuse rectal wall thickening shows no significant change. No evidence of bowel obstruction, inflammatory process, or abscess. Vascular/Lymphatic: No pathologically enlarged lymph nodes. No acute vascular findings. Reproductive:  No mass or other significant abnormality. Other: Multiple omental soft tissue nodules in the right abdomen have increased in size since previous study, largest measuring 1.5 x 1.3 cm on  image 43/2, compared to 7 x 7 mm previously. No evidence of ascites. Musculoskeletal: No suspicious bone lesions identified. 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 IMPRESSION: Mild increase in  size of multiple omental soft tissue nodules in the right abdomen, consistent with progressive omental carcinomatosis. 4 mm calculus now seen in distal left ureter adjacent to the ureteral stent, without hydronephrosis. Bilateral nephrolithiasis. Wedge-shaped area of decreased parenchymal enhancement in lateral midpole of left kidney, which may represent pyelonephritis or infarct. Electronically Signed   By: Marlaine Hind M.D.   On: 10/06/2021 19:08    Medications: I have reviewed the patient's current medications.   Assessment/Plan: Sigmoid colon cancer, stage IV (pT4a,pN2b,M1c) Colonoscopy 03/24/2019-3 rectal polyps-hyperplastic polyps, distal colon biopsy-at least intramucosal adenocarcinoma, completely obstructing mass in the distal sigmoid colon, could not be traversed, intact mismatch repair protein expression 03/24/2019-CEA 56.1 03/29/2019 CT abdomen/pelvis-circumferential thickening involving the entire mid and distal sigmoid colon to the level of the rectosigmoid junction, solid/cystic mass in the left ovary, bilateral nephrolithiasis Robotic assisted low anterior resection, mesenteric lymphadenectomy, bilateral salpingo-oophorectomy 04/08/2019 Pathology ([facility] review of outside pathology) metastatic adenocarcinoma involving the peritoneum overlying the round ligament, serosa of the urinary bladder, serosa of the right ureter a sacral area, pelvic peritoneum, and left ovary.  Omental biopsy with focal mucin pools with no carcinoma cells identified, right hemidiaphragm biopsy involved by metastatic adenocarcinoma, invasive adenocarcinoma the sigmoid colon, moderately differentiated, T4a, perineural and vascular invasion present, 7/22 lymph nodes, multiple tumor deposits, resection margins negative Negative for PD-L1, low probability of MSI-high, HER-2 negative, negative for BRAF, NRAS and KRAS alterations CTs 05/19/2019-no evidence of metastatic disease, findings suspicious for colitis of the transverse  and ascending colon, small amount of ascites in the cul-de-sac, bilateral renal calculi Cycle 1 FOLFIRI 05/24/2019, bevacizumab added with cycle 2 Cycle 4 FOLFIRI/bevacizumab 07/05/2019 Cycle 5 FOLFIRI 07/27/2019, bevacizumab held secondary to hypertension Cycle 6 FOLFIRI 08/10/1999, bevacizumab held secondary to hypertension CTs 08/30/2019-no evidence of recurrent disease, new subsolid right lower lobe nodule felt to be inflammatory, emphysema Cycle 7 FOLFIRI 09/26/2019, bevacizumab remains on hold secondary to hypertension Cycle 8 FOLFIRI 10/17/2019, bevacizumab held secondary to hypertension, Udenyca added for neutropenia Cycle 9 FOLFIRI 11/07/2019, bevacizumab held secondary to hypertension, Udenyca Cycle 10 FOLFIRI 11/28/2019, bevacizumab held, Udenyca Cycle 11 FOLFIRI 12/26/2019, bevacizumab held, Udenyca CTs 01/25/2020-no evidence of recurrent disease, resolution of right lower lobe nodule Maintenance Xeloda beginning 02/06/2020 CTs 04/25/2020- no evidence of metastatic disease, multiple bilateral renal calculi without hydronephrosis Maintenance Xeloda continued CT abdomen/pelvis without contrast 08/10/2020-bilateral staghorn renal calculi, no evidence of metastatic disease; addendum 08/23/2020-small but increasing omental nodules. Cycle 1 FOLFIRI/panitumumab 09/19/2020 Cycle 2 FOLFIRI/Panitumumab 10/03/2020 Cycle 3 FOLFIRI/Panitumumab 10/17/2020 Cycle 4 FOLFIRI/panitumumab 10/31/2020 Cycle 5 FOLFIRI/Panitumumab 11/14/2020 CTs 11/26/2020-stable omental metastases compared to 10/22/2020, mildly decreased from 08/10/2020.  Left lower quadrant tiny paracolic gutter implant slightly decreased from CT 08/10/2020.  No new or progressive metastatic disease in the abdomen or pelvis. Cycle 6 FOLFIRI/Panitumumab 11/28/2020, irinotecan dose reduced, treatment schedule adjusted to every 3 weeks going forward Cycle 7 FOLFIRI/panitumumab 12/24/2020 Cycle 8 FOLFIRI/Panitumumab 01/16/2021--treatment held due to  hypertension in the infusion area. Cycle 8 FOLFIRI/panitumumab 01/21/2021 Cycle 9 FOLFIRI/panitumumab 02/11/2021 Cycle 10 FOLFIRI/Panitumumab 03/04/2021 Cycle 11 FOLFIRI/Panitumumab 03/25/2021 CT abdomen/pelvis 04/10/2021-stable right omental and left posterior paracolic gutter nodules Cycle 12 FOLFIRI/panitumumab 04/15/2021 Cycle 13 FOLFIRI/panitumumab 05/06/2021 Cycle 14 FOLFIRI/Panitumumab 05/27/2021 Cycle 15 FOLFIRI/Panitumumab 06/17/2021 CT abdomen/pelvis 07/05/2021-slight enlargement of a dominant right omental implant, no new  implants Patient requested a treatment break CT abdomen/pelvis 10/04/2021-mild increase in size of multiple omental soft tissue nodules, 4 mm calculus in the distal left ureter adjacent to the ureteral stent   Hypertension G4 P3, twins Kidney stones-bilateral staghorn renal calculi on CT 08/10/2020 Infected Port-A-Cath 09/12/2019-placed on Augmentin, referred for Port-A-Cath removal; Port-A-Cath removed 09/14/2019; PICC line placed 09/14/2019; culture staph aureus.  Course of Septra completed. Neutropenia secondary to chemotherapy-Udenyca added with cycle 8 FOLFIRI Right nephrostomy tube 09/12/2020; stent placed 10/09/2020, percutaneous nephrostolithotomy treatment of right-sided kidney stones 10/09/2020, malpositioned right ureter stent replacement 10/23/2020, right ureter stent removed 10/29/2020 Percutaneous left nephrostolithotomy 10/01/2021-no renal stone identified, left ureter stent left in place        Disposition: April [name] has metastatic colon cancer.  She has been maintained off of systemic therapy since June.  The restaging CT reveals enlargement of peritoneal nodules.  I reviewed the CT findings and images with her.  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 We discussed treatment options.  There was slight enlargement of an omental nodule on CT 07/05/2021.  This occurred while on treatment with FOLFIRI/panitumumab.  Treatment options include oxaliplatin  based therapy and Lonsurf/Avastin.  I recommend a trial of Lonsurf/Avastin.  We reviewed potential toxicities associated with Lonsurf including the chance of nausea, diarrhea, and hematologic toxicity.  She has received bevacizumab in the past.  She understands the risk of hypertension and delayed wound healing.  Avastin will be placed on hold until she is 4 weeks out from the recent left renal procedure.  She will try a 10 mg dose of hydrocodone as needed for pain.  She will begin a bowel regimen.  April [name] will begin Lonsurf on 10/14/2021.  She will return for an office visit with a plan to begin bevacizumab on 10/28/2021.  She has mild prolongation of the QT interval.  I discussed the case with the [facility] center pharmacy and there is no contraindication to proceeding with Lonsurf.  Betsy Coder, MD  10/07/2021  1:26 PM

## 2021-10-07 NOTE — Telephone Encounter (Signed)
Oral Oncology Pharmacist Encounter  Received new prescription for Lonsurf (trifluridine/tipiracil)  for the treatment of metastatic colorectal cancer in conjunction with bevacizumab, planned duration until disease progression or unacceptable drug toxicity.  CMP from 10/07/21 and CBC from 10/04/21 assessed, no relevant lab abnormalities. Prescription dose and frequency assessed.   Current medication list in Epic reviewed, no DDIs with Lonsurf identified.  Evaluated chart and no patient barriers to medication adherence identified.   Prescription has been e-scribed to the [facility] for benefits analysis and approval.  Oral Oncology Clinic will continue to follow for insurance authorization, copayment issues, initial counseling and start date.   Darl Pikes, PharmD, BCPS, BCOP, CPP Hematology/Oncology Clinical Pharmacist Practitioner [place]/DB/AP [facility] [phone]  10/07/2021 12:15 PM

## 2021-10-07 NOTE — Patient Instructions (Signed)
For constipation:  Senna-S: take one by mouth twice daily  Dulcolax tablet one by mouth daily as needed    Constipation, Adult Constipation is when a person has trouble pooping (having a bowel movement). When you have this condition, you may poop fewer than 3 times a week. Your poop (stool) may also be dry, hard, or bigger than normal. Follow these instructions at home: Eating and drinking  Eat foods that have a lot of fiber, such as: Fresh fruits and vegetables. Whole grains. Beans. Eat less of foods that are low in fiber and high in fat and sugar, such as: [place] fries. Hamburgers. Cookies. Candy. Soda. Drink enough fluid to keep your pee (urine) pale yellow. General instructions Exercise regularly or as told by your doctor. Try to do 150 minutes of exercise each week. Go to the restroom when you feel like you need to poop. Do not hold it in. Take over-the-counter and prescription medicines only as told by your doctor. These include any fiber supplements. When you poop: Do deep breathing while relaxing your lower belly (abdomen). Relax your pelvic floor. The pelvic floor is a group of muscles that support the rectum, bladder, and intestines (as well as the uterus in women). Watch your condition for any changes. Tell your doctor if you notice any. Keep all follow-up visits as told by your doctor. This is important. Contact a doctor if: You have pain that gets worse. You have a fever. You have not pooped for 4 days. You vomit. You are not hungry. You lose weight. You are bleeding from the opening of the butt (anus). You have thin, pencil-like poop. Get help right away if: You have a fever, and your symptoms suddenly get worse. You leak poop or have blood in your poop. Your belly feels hard or bigger than normal (bloated). You have very bad belly pain. You feel dizzy or you faint. Summary Constipation is when a person poops fewer than 3 times a week, has trouble pooping,  or has poop that is dry, hard, or bigger than normal. Eat foods that have a lot of fiber. Drink enough fluid to keep your pee (urine) pale yellow. Take over-the-counter and prescription medicines only as told by your doctor. These include any fiber supplements. This information is not intended to replace advice given to you by your health care provider. Make sure you discuss any questions you have with your health care provider. Document Revised: 11/17/2018 Document Reviewed: 11/17/2018 Elsevier Patient Education  [place].

## 2021-10-07 NOTE — Telephone Encounter (Signed)
Oral Oncology Patient Advocate Encounter  After completing a benefits investigation, prior authorization for Lonsurf is not required at this time through [employer].  Patient's copay is $0.     Lady Deutscher, CPhT-Adv Oncology Pharmacy Patient [place] Direct Number: [phone]  Fax: [phone]

## 2021-10-08 ENCOUNTER — Other Ambulatory Visit (HOSPITAL_COMMUNITY): Payer: Self-pay

## 2021-10-08 ENCOUNTER — Telehealth: Payer: Self-pay

## 2021-10-08 NOTE — Telephone Encounter (Signed)
Patient called in and states she received her lonsurf and the direction is different from what Dr Benay Spice discuss with her. I called the patient 4x no answer and I can't leave a voicemail.

## 2021-10-09 ENCOUNTER — Other Ambulatory Visit (HOSPITAL_BASED_OUTPATIENT_CLINIC_OR_DEPARTMENT_OTHER): Payer: Medicare Other

## 2021-10-10 ENCOUNTER — Telehealth: Payer: Self-pay

## 2021-10-10 NOTE — Telephone Encounter (Signed)
I called Dr Amalia Hailey 's office no answer. I left a message on the triage line. I stated Dr Benay Spice would  like for Dr. Amalia Hailey to look at April [name] last CT scan before he take her stent out.

## 2021-10-14 ENCOUNTER — Other Ambulatory Visit (HOSPITAL_COMMUNITY): Payer: Self-pay

## 2021-10-14 DIAGNOSIS — N200 Calculus of kidney: Secondary | ICD-10-CM | POA: Diagnosis not present

## 2021-10-17 ENCOUNTER — Other Ambulatory Visit (HOSPITAL_COMMUNITY): Payer: Self-pay

## 2021-10-28 ENCOUNTER — Telehealth: Payer: Self-pay

## 2021-10-28 ENCOUNTER — Encounter: Payer: Self-pay | Admitting: Nurse Practitioner

## 2021-10-28 ENCOUNTER — Other Ambulatory Visit (HOSPITAL_BASED_OUTPATIENT_CLINIC_OR_DEPARTMENT_OTHER): Payer: Self-pay

## 2021-10-28 ENCOUNTER — Inpatient Hospital Stay (HOSPITAL_BASED_OUTPATIENT_CLINIC_OR_DEPARTMENT_OTHER): Payer: Medicare Other | Admitting: Nurse Practitioner

## 2021-10-28 ENCOUNTER — Inpatient Hospital Stay: Payer: Medicare Other | Attending: Oncology

## 2021-10-28 ENCOUNTER — Inpatient Hospital Stay: Payer: Medicare Other

## 2021-10-28 VITALS — BP 135/80 | HR 93 | Temp 98.2°F | Resp 18 | Ht 64.0 in | Wt 163.2 lb

## 2021-10-28 DIAGNOSIS — Z5112 Encounter for antineoplastic immunotherapy: Secondary | ICD-10-CM | POA: Diagnosis not present

## 2021-10-28 DIAGNOSIS — C19 Malignant neoplasm of rectosigmoid junction: Secondary | ICD-10-CM | POA: Diagnosis not present

## 2021-10-28 DIAGNOSIS — C187 Malignant neoplasm of sigmoid colon: Secondary | ICD-10-CM | POA: Diagnosis not present

## 2021-10-28 DIAGNOSIS — C786 Secondary malignant neoplasm of retroperitoneum and peritoneum: Secondary | ICD-10-CM | POA: Diagnosis not present

## 2021-10-28 DIAGNOSIS — C20 Malignant neoplasm of rectum: Secondary | ICD-10-CM

## 2021-10-28 DIAGNOSIS — Z79899 Other long term (current) drug therapy: Secondary | ICD-10-CM | POA: Insufficient documentation

## 2021-10-28 LAB — URINALYSIS, COMPLETE (UACMP) WITH MICROSCOPIC
Bilirubin Urine: NEGATIVE
Glucose, UA: NEGATIVE mg/dL
Ketones, ur: NEGATIVE mg/dL
Leukocytes,Ua: NEGATIVE
Nitrite: NEGATIVE
Specific Gravity, Urine: 1.019 (ref 1.005–1.030)
pH: 5.5 (ref 5.0–8.0)

## 2021-10-28 LAB — CMP (CANCER CENTER ONLY)
ALT: <5 U/L (ref 0–44)
AST: 15 U/L (ref 15–41)
Albumin: 4.6 g/dL (ref 3.5–5.0)
Alkaline Phosphatase: 113 U/L (ref 38–126)
Anion gap: 12 (ref 5–15)
BUN: 11 mg/dL (ref 6–20)
CO2: 25 mmol/L (ref 22–32)
Calcium: 9.9 mg/dL (ref 8.9–10.3)
Chloride: 100 mmol/L (ref 98–111)
Creatinine: 0.72 mg/dL (ref 0.44–1.00)
GFR, Estimated: >60 mL/min (ref >60)
Glucose, Bld: 97 mg/dL (ref 70–99)
Potassium: 3.3 mmol/L — ABNORMAL LOW (ref 3.5–5.1)
Sodium: 137 mmol/L (ref 135–145)
Total Bilirubin: 0.4 mg/dL (ref 0.3–1.2)
Total Protein: 7.9 g/dL (ref 6.5–8.1)

## 2021-10-28 LAB — CBC WITH DIFFERENTIAL (CANCER CENTER ONLY)
Abs Immature Granulocytes: 0.02 10*3/uL (ref 0.00–0.07)
Basophils Absolute: 0.1 10*3/uL (ref 0.0–0.1)
Basophils Relative: 1 %
Eosinophils Absolute: 0.4 10*3/uL (ref 0.0–0.5)
Eosinophils Relative: 6 %
HCT: 39.1 % (ref 36.0–46.0)
Hemoglobin: 13.1 g/dL (ref 12.0–15.0)
Immature Granulocytes: 0 %
Lymphocytes Relative: 50 %
Lymphs Abs: 3 10*3/uL (ref 0.7–4.0)
MCH: 30.3 pg (ref 26.0–34.0)
MCHC: 33.5 g/dL (ref 30.0–36.0)
MCV: 90.3 fL (ref 80.0–100.0)
Monocytes Absolute: 0.3 10*3/uL (ref 0.1–1.0)
Monocytes Relative: 4 %
Neutro Abs: 2.4 10*3/uL (ref 1.7–7.7)
Neutrophils Relative %: 39 %
Platelet Count: 290 10*3/uL (ref 150–400)
RBC: 4.33 MIL/uL (ref 3.87–5.11)
RDW: 13.7 % (ref 11.5–15.5)
WBC Count: 6.1 10*3/uL (ref 4.0–10.5)
nRBC: 0.3 % — ABNORMAL HIGH (ref 0.0–0.2)

## 2021-10-28 LAB — TOTAL PROTEIN, URINE DIPSTICK: Protein, ur: NEGATIVE mg/dL

## 2021-10-28 MED ORDER — PROCHLORPERAZINE MALEATE 10 MG PO TABS: 10.0000 mg | ORAL_TABLET | Freq: Four times a day (QID) | ORAL | 2 refills | Status: DC | PRN

## 2021-10-28 MED ORDER — SODIUM CHLORIDE 0.9 % IV SOLN
5.0000 mg/kg | Freq: Once | INTRAVENOUS | Status: AC
  Administered 2021-10-28: 400 mg via INTRAVENOUS
  Filled 2021-10-28: qty 16

## 2021-10-28 MED ORDER — SODIUM CHLORIDE 0.9 % IV SOLN: Freq: Once | INTRAVENOUS | Status: AC

## 2021-10-28 NOTE — Progress Notes (Signed)
Patient seen by Lisa Thomas NP today  Vitals are within treatment parameters.  Labs reviewed by Lisa Thomas NP and are within treatment parameters.  Per physician team, patient is ready for treatment and there are NO modifications to the treatment plan.     

## 2021-10-28 NOTE — Progress Notes (Signed)
[facility] OFFICE PROGRESS NOTE   Diagnosis: Colon cancer  INTERVAL HISTORY:   April [name] returns as scheduled.  She completed cycle 1 Lonsurf beginning 10/14/2021.  She reports daily nausea/vomiting/diarrhea since beginning [place].  She tried Phenergan with no improvement.  Late last night/early this morning she developed low abdominal pain.  She feels like she has a "bad UTI".  Objective:  Vital signs in last 24 hours:  Blood pressure 135/80, pulse 93, temperature 98.2 F (36.8 C), temperature source Oral, resp. rate 18, height $RemoveBe'5\' 4"'CUEKgoqkp$  (1.626 m), weight 163 lb 3.2 oz (74 kg), last menstrual period 01/07/2006, SpO2 98 %.    HEENT: No thrush or ulcers. Resp: Lungs clear bilaterally. Cardio: Regular rate and rhythm. GI: Abdomen soft and nontender.  No hepatosplenomegaly. Vascular: No leg edema. Neuro: Alert and oriented. Skin: No rash.   Lab Results:  Lab Results  Component Value Date   WBC 6.1 10/28/2021   HGB 13.1 10/28/2021   HCT 39.1 10/28/2021   MCV 90.3 10/28/2021   PLT 290 10/28/2021   NEUTROABS 2.4 10/28/2021    Imaging:  No results found.  Medications: I have reviewed the patient's current medications.  Assessment/Plan: Sigmoid colon cancer, stage IV (pT4a,pN2b,M1c) Colonoscopy 03/24/2019-3 rectal polyps-hyperplastic polyps, distal colon biopsy-at least intramucosal adenocarcinoma, completely obstructing mass in the distal sigmoid colon, could not be traversed, intact mismatch repair protein expression 03/24/2019-CEA 56.1 03/29/2019 CT abdomen/pelvis-circumferential thickening involving the entire mid and distal sigmoid colon to the level of the rectosigmoid junction, solid/cystic mass in the left ovary, bilateral nephrolithiasis Robotic assisted low anterior resection, mesenteric lymphadenectomy, bilateral salpingo-oophorectomy 04/08/2019 Pathology ([facility] review of outside pathology) metastatic adenocarcinoma involving the peritoneum overlying  the round ligament, serosa of the urinary bladder, serosa of the right ureter a sacral area, pelvic peritoneum, and left ovary.  Omental biopsy with focal mucin pools with no carcinoma cells identified, right hemidiaphragm biopsy involved by metastatic adenocarcinoma, invasive adenocarcinoma the sigmoid colon, moderately differentiated, T4a, perineural and vascular invasion present, 7/22 lymph nodes, multiple tumor deposits, resection margins negative Negative for PD-L1, low probability of MSI-high, HER-2 negative, negative for BRAF, NRAS and KRAS alterations CTs 05/19/2019-no evidence of metastatic disease, findings suspicious for colitis of the transverse and ascending colon, small amount of ascites in the cul-de-sac, bilateral renal calculi Cycle 1 FOLFIRI 05/24/2019, bevacizumab added with cycle 2 Cycle 4 FOLFIRI/bevacizumab 07/05/2019 Cycle 5 FOLFIRI 07/27/2019, bevacizumab held secondary to hypertension Cycle 6 FOLFIRI 08/10/1999, bevacizumab held secondary to hypertension CTs 08/30/2019-no evidence of recurrent disease, new subsolid right lower lobe nodule felt to be inflammatory, emphysema Cycle 7 FOLFIRI 09/26/2019, bevacizumab remains on hold secondary to hypertension Cycle 8 FOLFIRI 10/17/2019, bevacizumab held secondary to hypertension, Udenyca added for neutropenia Cycle 9 FOLFIRI 11/07/2019, bevacizumab held secondary to hypertension, Udenyca Cycle 10 FOLFIRI 11/28/2019, bevacizumab held, Udenyca Cycle 11 FOLFIRI 12/26/2019, bevacizumab held, Udenyca CTs 01/25/2020-no evidence of recurrent disease, resolution of right lower lobe nodule Maintenance Xeloda beginning 02/06/2020 CTs 04/25/2020- no evidence of metastatic disease, multiple bilateral renal calculi without hydronephrosis Maintenance Xeloda continued CT abdomen/pelvis without contrast 08/10/2020-bilateral staghorn renal calculi, no evidence of metastatic disease; addendum 08/23/2020-small but increasing omental nodules. Cycle 1  FOLFIRI/panitumumab 09/19/2020 Cycle 2 FOLFIRI/Panitumumab 10/03/2020 Cycle 3 FOLFIRI/Panitumumab 10/17/2020 Cycle 4 FOLFIRI/panitumumab 10/31/2020 Cycle 5 FOLFIRI/Panitumumab 11/14/2020 CTs 11/26/2020-stable omental metastases compared to 10/22/2020, mildly decreased from 08/10/2020.  Left lower quadrant tiny paracolic gutter implant slightly decreased from CT 08/10/2020.  No new or progressive metastatic disease in the abdomen or pelvis.  Cycle 6 FOLFIRI/Panitumumab 11/28/2020, irinotecan dose reduced, treatment schedule adjusted to every 3 weeks going forward Cycle 7 FOLFIRI/panitumumab 12/24/2020 Cycle 8 FOLFIRI/Panitumumab 01/16/2021--treatment held due to hypertension in the infusion area. Cycle 8 FOLFIRI/panitumumab 01/21/2021 Cycle 9 FOLFIRI/panitumumab 02/11/2021 Cycle 10 FOLFIRI/Panitumumab 03/04/2021 Cycle 11 FOLFIRI/Panitumumab 03/25/2021 CT abdomen/pelvis 04/10/2021-stable right omental and left posterior paracolic gutter nodules Cycle 12 FOLFIRI/panitumumab 04/15/2021 Cycle 13 FOLFIRI/panitumumab 05/06/2021 Cycle 14 FOLFIRI/Panitumumab 05/27/2021 Cycle 15 FOLFIRI/Panitumumab 06/17/2021 CT abdomen/pelvis 07/05/2021-slight enlargement of a dominant right omental implant, no new implants Patient requested a treatment break CT abdomen/pelvis 10/04/2021-mild increase in size of multiple omental soft tissue nodules, 4 mm calculus in the distal left ureter adjacent to the ureteral stent Cycle 1 Lonsurf 10/14/2021 10/28/2021 Avastin every 2 weeks   Hypertension G4 P3, twins Kidney stones-bilateral staghorn renal calculi on CT 08/10/2020 Infected Port-A-Cath 09/12/2019-placed on Augmentin, referred for Port-A-Cath removal; Port-A-Cath removed 09/14/2019; PICC line placed 09/14/2019; culture staph aureus.  Course of Septra completed. Neutropenia secondary to chemotherapy-Udenyca added with cycle 8 FOLFIRI Right nephrostomy tube 09/12/2020; stent placed 10/09/2020, percutaneous nephrostolithotomy treatment of  right-sided kidney stones 10/09/2020, malpositioned right ureter stent replacement 10/23/2020, right ureter stent removed 10/29/2020 Percutaneous left nephrostolithotomy 10/01/2021-no renal stone identified, left ureter stent left in place Cystoscopy 10/14/2021-stent removed      Disposition: April [name] appears stable.  She has completed 1 cycle of Lonsurf.  She has significant nausea/vomiting, manageable diarrhea.  She is scheduled to begin treatment today with Avastin on a 2-week schedule.  She received Avastin several years ago.  We received potential side effects including bleeding, delayed wound healing, wound dehiscence, bowel perforation, hypertension, proteinuria, increased risk of blood clots and strokes, posterior reversible encephalopathy syndrome.  She agrees to proceed.  CBC and chemistry panel reviewed.  Labs adequate to proceed as above.  She has mild hypokalemia.  She has not been taking potassium consistently.  She will try to take as prescribed.  We discussed treatment options beyond Lonsurf/Avastin.  We specifically discussed FOLFOX.  We reviewed potential side effects associated with oxaliplatin.  She would like to continue Lonsurf/Avastin for now.  Urinalysis showed increased red cells likely due to the recent stent removal.  No increase in white cells.  She will return for lab, follow-up, Avastin in 2 weeks.  We are available to see her sooner if needed.  Patient seen with Dr. Benay Spice.    Ned Card ANP/GNP-BC   10/28/2021  11:37 AM  This was a shared visit with Ned Card.  April [name] appears unchanged.  Cycle 1 Lonsurf was complicated by nausea.  She will use Zofran prophylaxis with the next cycle of Lonsurf.  She will begin Avastin today.  We discussed additional treatment options including FOLFOX chemotherapy we discussed potential toxicities associated with oxaliplatin and the need for central IV access.  She would like to continue the Lonsurf/Avastin for  now.  I was present for greater than 50% of today's visit.  I performed medical decision making.  April Manson, MD

## 2021-10-28 NOTE — Patient Instructions (Signed)
[facility]  Discharge Instructions: Thank you for choosing [facility] to provide your oncology and hematology care.   If you have a lab appointment with the Cancer Center, please go directly to the Cancer Center and check in at the registration area.   Wear comfortable clothing and clothing appropriate for easy access to any Portacath or PICC line.   We strive to give you quality time with your provider. You may need to reschedule your appointment if you arrive late (15 or more minutes).  Arriving late affects you and other patients whose appointments are after yours.  Also, if you miss three or more appointments without notifying the office, you may be dismissed from the clinic at the provider's discretion.      For prescription refill requests, have your pharmacy contact our office and allow 72 hours for refills to be completed.    Today you received the following chemotherapy and/or immunotherapy agents Bevacizumab      To help prevent nausea and vomiting after your treatment, we encourage you to take your nausea medication as directed.  BELOW ARE SYMPTOMS THAT SHOULD BE REPORTED IMMEDIATELY: *FEVER GREATER THAN 100.4 F (38 C) OR HIGHER *CHILLS OR SWEATING *NAUSEA AND VOMITING THAT IS NOT CONTROLLED WITH YOUR NAUSEA MEDICATION *UNUSUAL SHORTNESS OF BREATH *UNUSUAL BRUISING OR BLEEDING *URINARY PROBLEMS (pain or burning when urinating, or frequent urination) *BOWEL PROBLEMS (unusual diarrhea, constipation, pain near the anus) TENDERNESS IN MOUTH AND THROAT WITH OR WITHOUT PRESENCE OF ULCERS (sore throat, sores in mouth, or a toothache) UNUSUAL RASH, SWELLING OR PAIN  UNUSUAL VAGINAL DISCHARGE OR ITCHING   Items with * indicate a potential emergency and should be followed up as soon as possible or go to the Emergency Department if any problems should occur.  Please show the CHEMOTHERAPY ALERT CARD or IMMUNOTHERAPY ALERT CARD at check-in to  the Emergency Department and triage nurse.  Should you have questions after your visit or need to cancel or reschedule your appointment, please contact [facility]  Dept: [phone]  and follow the prompts.  Office hours are 8:00 a.m. to 4:30 p.m. Monday - Friday. Please note that voicemails left after 4:00 p.m. may not be returned until the following business day.  We are closed weekends and major holidays. You have access to a nurse at all times for urgent questions. Please call the main number to the clinic Dept: [phone] and follow the prompts.   For any non-urgent questions, you may also contact your provider using MyChart. We now offer e-Visits for anyone 18 and older to request care online for non-urgent symptoms. For details visit [email or web address].   Also download the MyChart app! Go to the app store, search "MyChart", open the app, select [facility], and log in with your MyChart username and password.  Masks are optional in the cancer centers. If you would like for your care team to wear a mask while they are taking care of you, please let them know. You may have one support person who is at least 48 years old accompany you for your appointments.  Bevacizumab Injection What is this medication? BEVACIZUMAB (be va SIZ yoo mab) treats some types of cancer. It works by blocking a protein that causes cancer cells to grow and multiply. This helps to slow or stop the spread of cancer cells. It is a monoclonal antibody. This medicine may be used for other purposes; ask your health care provider or   pharmacist if you have questions. COMMON BRAND NAME(S): Alymsys, Avastin, MVASI, Zirabev What should I tell my care team before I take this medication? They need to know if you have any of these conditions: Blood clots Coughing up blood Having or recent surgery Heart failure High blood pressure History of a connection between 2 or more body parts that do not  usually connect (fistula) History of a tear in your stomach or intestines Protein in your urine An unusual or allergic reaction to bevacizumab, other medications, foods, dyes, or preservatives Pregnant or trying to get pregnant Breast-feeding How should I use this medication? This medication is injected into a vein. It is given by your care team in a hospital or clinic setting. Talk to your care team the use of this medication in children. Special care may be needed. Overdosage: If you think you have taken too much of this medicine contact a poison control center or emergency room at once. NOTE: This medicine is only for you. Do not share this medicine with others. What if I miss a dose? Keep appointments for follow-up doses. It is important not to miss your dose. Call your care team if you are unable to keep an appointment. What may interact with this medication? Interactions are not expected. This list may not describe all possible interactions. Give your health care provider a list of all the medicines, herbs, non-prescription drugs, or dietary supplements you use. Also tell them if you smoke, drink alcohol, or use illegal drugs. Some items may interact with your medicine. What should I watch for while using this medication? Your condition will be monitored carefully while you are receiving this medication. You may need blood work while taking this medication. This medication may make you feel generally unwell. This is not uncommon as chemotherapy can affect healthy cells as well as cancer cells. Report any side effects. Continue your course of treatment even though you feel ill unless your care team tells you to stop. This medication may increase your risk to bruise or bleed. Call your care team if you notice any unusual bleeding. Before having surgery, talk to your care team to make sure it is ok. This medication can increase the risk of poor healing of your surgical site or wound. You will  need to stop this medication for 28 days before surgery. After surgery, wait at least 28 days before restarting this medication. Make sure the surgical site or wound is healed enough before restarting this medication. Talk to your care team if questions. Talk to your care team if you may be pregnant. Serious birth defects can occur if you take this medication during pregnancy and for 6 months after the last dose. Contraception is recommended while taking this medication and for 6 months after the last dose. Your care team can help you find the option that works for you. Do not breastfeed while taking this medication and for 6 months after the last dose. This medication can cause infertility. Talk to your care team if you are concerned about your fertility. What side effects may I notice from receiving this medication? Side effects that you should report to your care team as soon as possible: Allergic reactions--skin rash, itching, hives, swelling of the face, lips, tongue, or throat Bleeding--bloody or black, tar-like stools, vomiting blood or brown material that looks like coffee grounds, red or dark brown urine, small red or purple spots on skin, unusual bruising or bleeding Blood clot--pain, swelling, or warmth in the leg,   shortness of breath, chest pain Heart attack--pain or tightness in the chest, shoulders, arms, or jaw, nausea, shortness of breath, cold or clammy skin, feeling faint or lightheaded Heart failure--shortness of breath, swelling of the ankles, feet, or hands, sudden weight gain, unusual weakness or fatigue Increase in blood pressure Infection--fever, chills, cough, sore throat, wounds that don't heal, pain or trouble when passing urine, general feeling of discomfort or being unwell Infusion reactions--chest pain, shortness of breath or trouble breathing, feeling faint or lightheaded Kidney injury--decrease in the amount of urine, swelling of the ankles, hands, or feet Stomach pain  that is severe, does not go away, or gets worse Stroke--sudden numbness or weakness of the face, arm, or leg, trouble speaking, confusion, trouble walking, loss of balance or coordination, dizziness, severe headache, change in vision Sudden and severe headache, confusion, change in vision, seizures, which may be signs of posterior reversible encephalopathy syndrome (PRES) Side effects that usually do not require medical attention (report to your care team if they continue or are bothersome): Back pain Change in taste Diarrhea Dry skin Increased tears Nosebleed This list may not describe all possible side effects. Call your doctor for medical advice about side effects. You may report side effects to FDA at 1-800-FDA-1088. Where should I keep my medication? This medication is given in a hospital or clinic. It will not be stored at home. NOTE: This sheet is a summary. It may not cover all possible information. If you have questions about this medicine, talk to your doctor, pharmacist, or health care provider.  2023 Elsevier/Gold Standard (2021-05-14 00:00:00)  

## 2021-10-28 NOTE — Telephone Encounter (Addendum)
Called lab and spoke with [place] B. Percell Miller to add a urine culture and ua. She stated she can add the urine culture/ua and to release both order. Orders are release.

## 2021-10-29 ENCOUNTER — Other Ambulatory Visit: Payer: Self-pay | Admitting: Oncology

## 2021-10-29 ENCOUNTER — Other Ambulatory Visit (HOSPITAL_COMMUNITY): Payer: Self-pay

## 2021-10-29 DIAGNOSIS — C20 Malignant neoplasm of rectum: Secondary | ICD-10-CM

## 2021-10-29 LAB — URINE CULTURE: Culture: NO GROWTH

## 2021-10-29 MED ORDER — LONSURF 20-8.19 MG PO TABS
ORAL_TABLET | ORAL | 0 refills | Status: DC
  Filled 2021-10-29: qty 50, 28d supply, fill #0

## 2021-10-30 ENCOUNTER — Other Ambulatory Visit: Payer: Self-pay | Admitting: Oncology

## 2021-10-30 ENCOUNTER — Telehealth: Payer: Self-pay

## 2021-10-30 NOTE — Telephone Encounter (Signed)
-----   Message from April Pier, MD sent at 10/29/2021  4:53 PM EDT ----- Please call patient urine culture is negative

## 2021-10-30 NOTE — Telephone Encounter (Signed)
Patient gave verbal understanding had no further questions or concerns. 

## 2021-11-05 ENCOUNTER — Other Ambulatory Visit (HOSPITAL_COMMUNITY): Payer: Self-pay

## 2021-11-07 ENCOUNTER — Other Ambulatory Visit (HOSPITAL_COMMUNITY): Payer: Self-pay

## 2021-11-11 ENCOUNTER — Inpatient Hospital Stay: Payer: Medicare Other

## 2021-11-11 ENCOUNTER — Inpatient Hospital Stay (HOSPITAL_BASED_OUTPATIENT_CLINIC_OR_DEPARTMENT_OTHER): Payer: Medicare Other | Admitting: Oncology

## 2021-11-11 VITALS — BP 132/82

## 2021-11-11 VITALS — BP 145/90 | HR 100 | Temp 98.1°F | Resp 18 | Ht 64.0 in | Wt 164.2 lb

## 2021-11-11 DIAGNOSIS — C19 Malignant neoplasm of rectosigmoid junction: Secondary | ICD-10-CM | POA: Diagnosis not present

## 2021-11-11 DIAGNOSIS — C20 Malignant neoplasm of rectum: Secondary | ICD-10-CM

## 2021-11-11 DIAGNOSIS — Z79899 Other long term (current) drug therapy: Secondary | ICD-10-CM | POA: Diagnosis not present

## 2021-11-11 DIAGNOSIS — C786 Secondary malignant neoplasm of retroperitoneum and peritoneum: Secondary | ICD-10-CM

## 2021-11-11 DIAGNOSIS — Z5112 Encounter for antineoplastic immunotherapy: Secondary | ICD-10-CM | POA: Diagnosis not present

## 2021-11-11 DIAGNOSIS — C187 Malignant neoplasm of sigmoid colon: Secondary | ICD-10-CM | POA: Diagnosis not present

## 2021-11-11 LAB — CBC WITH DIFFERENTIAL (CANCER CENTER ONLY)
Abs Immature Granulocytes: 0.01 10*3/uL (ref 0.00–0.07)
Basophils Absolute: 0 10*3/uL (ref 0.0–0.1)
Basophils Relative: 1 %
Eosinophils Absolute: 0.3 10*3/uL (ref 0.0–0.5)
Eosinophils Relative: 5 %
HCT: 37.9 % (ref 36.0–46.0)
Hemoglobin: 12.9 g/dL (ref 12.0–15.0)
Immature Granulocytes: 0 %
Lymphocytes Relative: 52 %
Lymphs Abs: 2.6 10*3/uL (ref 0.7–4.0)
MCH: 31.1 pg (ref 26.0–34.0)
MCHC: 34 g/dL (ref 30.0–36.0)
MCV: 91.3 fL (ref 80.0–100.0)
Monocytes Absolute: 0.5 10*3/uL (ref 0.1–1.0)
Monocytes Relative: 9 %
Neutro Abs: 1.7 10*3/uL (ref 1.7–7.7)
Neutrophils Relative %: 33 %
Platelet Count: 305 10*3/uL (ref 150–400)
RBC: 4.15 MIL/uL (ref 3.87–5.11)
RDW: 14.8 % (ref 11.5–15.5)
WBC Count: 5.1 10*3/uL (ref 4.0–10.5)
nRBC: 0 % (ref 0.0–0.2)

## 2021-11-11 MED ORDER — SODIUM CHLORIDE 0.9 % IV SOLN: Freq: Once | INTRAVENOUS | Status: AC

## 2021-11-11 MED ORDER — SODIUM CHLORIDE 0.9 % IV SOLN
5.0000 mg/kg | Freq: Once | INTRAVENOUS | Status: AC
  Administered 2021-11-11: 400 mg via INTRAVENOUS
  Filled 2021-11-11: qty 16

## 2021-11-11 NOTE — Progress Notes (Signed)
[facility] OFFICE PROGRESS NOTE   Diagnosis: Colon cancer  INTERVAL HISTORY:   April [name] returns as scheduled.  She reports improvement in abdominal pain.  She is not using hydrocodone.  No bleeding or symptom of thrombosis.  No new complaint.  Objective:  Vital signs in last 24 hours:  Blood pressure (!) 145/90, pulse 100, temperature 98.1 F (36.7 C), temperature source Oral, resp. rate 18, height _0  (1.626 m), weight 164 lb 3.2 oz (74.5 kg), last menstrual period 01/07/2006, SpO2 100 %.    HEENT: No thrush or ulcers Resp: Lungs clear bilaterally Cardio: Regular rate and rhythm GI: No mass, no hepatosplenomegaly, nontender Vascular: No leg edema   Lab Results:  Lab Results  Component Value Date   WBC 5.1 11/11/2021   HGB 12.9 11/11/2021   HCT 37.9 11/11/2021   MCV 91.3 11/11/2021   PLT 305 11/11/2021   NEUTROABS 1.7 11/11/2021    CMP  Lab Results  Component Value Date   NA 137 10/28/2021   K 3.3 (L) 10/28/2021   CL 100 10/28/2021   CO2 25 10/28/2021   GLUCOSE 97 10/28/2021   BUN 11 10/28/2021   CREATININE 0.72 10/28/2021   CALCIUM 9.9 10/28/2021   PROT 7.9 10/28/2021   ALBUMIN 4.6 10/28/2021   AST 15 10/28/2021   ALT <5 10/28/2021   ALKPHOS 113 10/28/2021   BILITOT 0.4 10/28/2021   GFRNONAA >60 10/28/2021   GFRAA >60 10/17/2019    Lab Results  Component Value Date   CEA1 8.66 (H) 07/02/2020   CEA 5.50 (H) 10/04/2021   Medications: I have reviewed the patient's current medications.   Assessment/Plan:  Sigmoid colon cancer, stage IV (pT4a,pN2b,M1c) Colonoscopy 03/24/2019-3 rectal polyps-hyperplastic polyps, distal colon biopsy-at least intramucosal adenocarcinoma, completely obstructing mass in the distal sigmoid colon, could not be traversed, intact mismatch repair protein expression 03/24/2019-CEA 56.1 03/29/2019 CT abdomen/pelvis-circumferential thickening involving the entire mid and distal sigmoid colon to the level of  the rectosigmoid junction, solid/cystic mass in the left ovary, bilateral nephrolithiasis Robotic assisted low anterior resection, mesenteric lymphadenectomy, bilateral salpingo-oophorectomy 04/08/2019 Pathology ([facility] review of outside pathology) metastatic adenocarcinoma involving the peritoneum overlying the round ligament, serosa of the urinary bladder, serosa of the right ureter a sacral area, pelvic peritoneum, and left ovary.  Omental biopsy with focal mucin pools with no carcinoma cells identified, right hemidiaphragm biopsy involved by metastatic adenocarcinoma, invasive adenocarcinoma the sigmoid colon, moderately differentiated, T4a, perineural and vascular invasion present, 7/22 lymph nodes, multiple tumor deposits, resection margins negative Negative for PD-L1, low probability of MSI-high, HER-2 negative, negative for BRAF, NRAS and KRAS alterations CTs 05/19/2019-no evidence of metastatic disease, findings suspicious for colitis of the transverse and ascending colon, small amount of ascites in the cul-de-sac, bilateral renal calculi Cycle 1 FOLFIRI 05/24/2019, bevacizumab added with cycle 2 Cycle 4 FOLFIRI/bevacizumab 07/05/2019 Cycle 5 FOLFIRI 07/27/2019, bevacizumab held secondary to hypertension Cycle 6 FOLFIRI 08/10/1999, bevacizumab held secondary to hypertension CTs 08/30/2019-no evidence of recurrent disease, new subsolid right lower lobe nodule felt to be inflammatory, emphysema Cycle 7 FOLFIRI 09/26/2019, bevacizumab remains on hold secondary to hypertension Cycle 8 FOLFIRI 10/17/2019, bevacizumab held secondary to hypertension, Udenyca added for neutropenia Cycle 9 FOLFIRI 11/07/2019, bevacizumab held secondary to hypertension, Udenyca Cycle 10 FOLFIRI 11/28/2019, bevacizumab held, Udenyca Cycle 11 FOLFIRI 12/26/2019, bevacizumab held, Udenyca CTs 01/25/2020-no evidence of recurrent disease, resolution of right lower lobe nodule Maintenance Xeloda beginning 02/06/2020 CTs 04/25/2020- no  evidence of metastatic disease, multiple bilateral renal  calculi without hydronephrosis Maintenance Xeloda continued CT abdomen/pelvis without contrast 08/10/2020-bilateral staghorn renal calculi, no evidence of metastatic disease; addendum 08/23/2020-small but increasing omental nodules. Cycle 1 FOLFIRI/panitumumab 09/19/2020 Cycle 2 FOLFIRI/Panitumumab 10/03/2020 Cycle 3 FOLFIRI/Panitumumab 10/17/2020 Cycle 4 FOLFIRI/panitumumab 10/31/2020 Cycle 5 FOLFIRI/Panitumumab 11/14/2020 CTs 11/26/2020-stable omental metastases compared to 10/22/2020, mildly decreased from 08/10/2020.  Left lower quadrant tiny paracolic gutter implant slightly decreased from CT 08/10/2020.  No new or progressive metastatic disease in the abdomen or pelvis. Cycle 6 FOLFIRI/Panitumumab 11/28/2020, irinotecan dose reduced, treatment schedule adjusted to every 3 weeks going forward Cycle 7 FOLFIRI/panitumumab 12/24/2020 Cycle 8 FOLFIRI/Panitumumab 01/16/2021--treatment held due to hypertension in the infusion area. Cycle 8 FOLFIRI/panitumumab 01/21/2021 Cycle 9 FOLFIRI/panitumumab 02/11/2021 Cycle 10 FOLFIRI/Panitumumab 03/04/2021 Cycle 11 FOLFIRI/Panitumumab 03/25/2021 CT abdomen/pelvis 04/10/2021-stable right omental and left posterior paracolic gutter nodules Cycle 12 FOLFIRI/panitumumab 04/15/2021 Cycle 13 FOLFIRI/panitumumab 05/06/2021 Cycle 14 FOLFIRI/Panitumumab 05/27/2021 Cycle 15 FOLFIRI/Panitumumab 06/17/2021 CT abdomen/pelvis 07/05/2021-slight enlargement of a dominant right omental implant, no new implants Patient requested a treatment break CT abdomen/pelvis 10/04/2021-mild increase in size of multiple omental soft tissue nodules, 4 mm calculus in the distal left ureter adjacent to the ureteral stent Cycle 1 Lonsurf 10/14/2021 10/28/2021 Avastin every 2 weeks Cycle 2 Lonsurf 11/11/2021   Hypertension G4 P3, twins Kidney stones-bilateral staghorn renal calculi on CT 08/10/2020 Infected Port-A-Cath 09/12/2019-placed on Augmentin,  referred for Port-A-Cath removal; Port-A-Cath removed 09/14/2019; PICC line placed 09/14/2019; culture staph aureus.  Course of Septra completed. Neutropenia secondary to chemotherapy-Udenyca added with cycle 8 FOLFIRI Right nephrostomy tube 09/12/2020; stent placed 10/09/2020, percutaneous nephrostolithotomy treatment of right-sided kidney stones 10/09/2020, malpositioned right ureter stent replacement 10/23/2020, right ureter stent removed 10/29/2020 Percutaneous left nephrostolithotomy 10/01/2021-no renal stone identified, left ureter stent left in place Cystoscopy 10/14/2021-stent removed    Disposition: Ms. Bosso appears stable.  She will begin cycle 2 Lonsurf today.  She continues every 2-week Avastin therapy.  She will return for an office visit and Avastin in 2 weeks.  We will plan for a restaging CT after 3-4 cycles of Lonsurf/Avastin.  Betsy Coder, MD  11/11/2021  10:19 AM

## 2021-11-11 NOTE — Progress Notes (Signed)
[facility] CSW Progress Note  Clinical Education officer, museum contacted patient by phone to address financial concerns per the request of Nurse Navigator.  Patient reported that she has secured an apartment, but needs assistance with utilities.  Explored resources available.  CSW to meet with patient tomorrow at the [facility] location to further assess.  CSW to also contact Otilio Carpen to determine if patient received the [employer] previously.  Patient stated she would appreciate a bag of food.  Patient has been contacting local non-profits to seek assistance also.  She expressed no other needs at this times.    Rodman Pickle April Gipe, LCSW    Patient is participating in a Managed Medicaid Plan:  Yes

## 2021-11-11 NOTE — Progress Notes (Signed)
Patient seen by Dr. Sherrill today ? ?Vitals are within treatment parameters. ? ?Labs reviewed by Dr. Sherrill and are within treatment parameters. ? ?Per physician team, patient is ready for treatment and there are NO modifications to the treatment plan.  ?

## 2021-11-11 NOTE — Patient Instructions (Signed)
[facility]  Discharge Instructions: Thank you for choosing [facility] to provide your oncology and hematology care.   If you have a lab appointment with the [facility], please go directly to the [facility] and check in at the registration area.   Wear comfortable clothing and clothing appropriate for easy access to any Portacath or PICC line.   We strive to give you quality time with your provider. You may need to reschedule your appointment if you arrive late (15 or more minutes).  Arriving late affects you and other patients whose appointments are after yours.  Also, if you miss three or more appointments without notifying the office, you may be dismissed from the clinic at the provider's discretion.      For prescription refill requests, have your pharmacy contact our office and allow 72 hours for refills to be completed.    Today you received the following chemotherapy and/or immunotherapy agents Bevacizumab      To help prevent nausea and vomiting after your treatment, we encourage you to take your nausea medication as directed.  BELOW ARE SYMPTOMS THAT SHOULD BE REPORTED IMMEDIATELY: *FEVER GREATER THAN 100.4 F (38 C) OR HIGHER *CHILLS OR SWEATING *NAUSEA AND VOMITING THAT IS NOT CONTROLLED WITH YOUR NAUSEA MEDICATION *UNUSUAL SHORTNESS OF BREATH *UNUSUAL BRUISING OR BLEEDING *URINARY PROBLEMS (pain or burning when urinating, or frequent urination) *BOWEL PROBLEMS (unusual diarrhea, constipation, pain near the anus) TENDERNESS IN MOUTH AND THROAT WITH OR WITHOUT PRESENCE OF ULCERS (sore throat, sores in mouth, or a toothache) UNUSUAL RASH, SWELLING OR PAIN  UNUSUAL VAGINAL DISCHARGE OR ITCHING   Items with * indicate a potential emergency and should be followed up as soon as possible or go to the Emergency Department if any problems should occur.  Please show the CHEMOTHERAPY ALERT CARD or IMMUNOTHERAPY ALERT CARD at check-in to  the Emergency Department and triage nurse.  Should you have questions after your visit or need to cancel or reschedule your appointment, please contact [facility]  Dept: [phone]  and follow the prompts.  Office hours are 8:00 a.m. to 4:30 p.m. Monday - Friday. Please note that voicemails left after 4:00 p.m. may not be returned until the following business day.  We are closed weekends and major holidays. You have access to a nurse at all times for urgent questions. Please call the main number to the clinic Dept: [phone] and follow the prompts.   For any non-urgent questions, you may also contact your provider using MyChart. We now offer e-Visits for anyone 78 and older to request care online for non-urgent symptoms. For details visit [email or web address].   Also download the MyChart app! Go to the app store, search "MyChart", open the app, select [facility], and log in with your MyChart username and password.  Masks are optional in the cancer centers. If you would like for your care team to wear a mask while they are taking care of you, please let them know. You may have one support person who is at least 48 years old accompany you for your appointments.  Bevacizumab Injection What is this medication? BEVACIZUMAB (be va SIZ yoo mab) treats some types of cancer. It works by blocking a protein that causes cancer cells to grow and multiply. This helps to slow or stop the spread of cancer cells. It is a monoclonal antibody. This medicine may be used for other purposes; ask your health care provider or  pharmacist if you have questions. COMMON BRAND NAME(S): Alymsys, Avastin, MVASI, Noah Charon What should I tell my care team before I take this medication? They need to know if you have any of these conditions: Blood clots Coughing up blood Having or recent surgery Heart failure High blood pressure History of a connection between 2 or more body parts that do not  usually connect (fistula) History of a tear in your stomach or intestines Protein in your urine An unusual or allergic reaction to bevacizumab, other medications, foods, dyes, or preservatives Pregnant or trying to get pregnant Breast-feeding How should I use this medication? This medication is injected into a vein. It is given by your care team in a hospital or clinic setting. Talk to your care team the use of this medication in children. Special care may be needed. Overdosage: If you think you have taken too much of this medicine contact a poison control center or emergency room at once. NOTE: This medicine is only for you. Do not share this medicine with others. What if I miss a dose? Keep appointments for follow-up doses. It is important not to miss your dose. Call your care team if you are unable to keep an appointment. What may interact with this medication? Interactions are not expected. This list may not describe all possible interactions. Give your health care provider a list of all the medicines, herbs, non-prescription drugs, or dietary supplements you use. Also tell them if you smoke, drink alcohol, or use illegal drugs. Some items may interact with your medicine. What should I watch for while using this medication? Your condition will be monitored carefully while you are receiving this medication. You may need blood work while taking this medication. This medication may make you feel generally unwell. This is not uncommon as chemotherapy can affect healthy cells as well as cancer cells. Report any side effects. Continue your course of treatment even though you feel ill unless your care team tells you to stop. This medication may increase your risk to bruise or bleed. Call your care team if you notice any unusual bleeding. Before having surgery, talk to your care team to make sure it is ok. This medication can increase the risk of poor healing of your surgical site or wound. You will  need to stop this medication for 28 days before surgery. After surgery, wait at least 28 days before restarting this medication. Make sure the surgical site or wound is healed enough before restarting this medication. Talk to your care team if questions. Talk to your care team if you may be pregnant. Serious birth defects can occur if you take this medication during pregnancy and for 6 months after the last dose. Contraception is recommended while taking this medication and for 6 months after the last dose. Your care team can help you find the option that works for you. Do not breastfeed while taking this medication and for 6 months after the last dose. This medication can cause infertility. Talk to your care team if you are concerned about your fertility. What side effects may I notice from receiving this medication? Side effects that you should report to your care team as soon as possible: Allergic reactions--skin rash, itching, hives, swelling of the face, lips, tongue, or throat Bleeding--bloody or black, tar-like stools, vomiting blood or brown material that looks like coffee grounds, red or dark brown urine, small red or purple spots on skin, unusual bruising or bleeding Blood clot--pain, swelling, or warmth in the leg,  shortness of breath, chest pain Heart attack--pain or tightness in the chest, shoulders, arms, or jaw, nausea, shortness of breath, cold or clammy skin, feeling faint or lightheaded Heart failure--shortness of breath, swelling of the ankles, feet, or hands, sudden weight gain, unusual weakness or fatigue Increase in blood pressure Infection--fever, chills, cough, sore throat, wounds that don't heal, pain or trouble when passing urine, general feeling of discomfort or being unwell Infusion reactions--chest pain, shortness of breath or trouble breathing, feeling faint or lightheaded Kidney injury--decrease in the amount of urine, swelling of the ankles, hands, or feet Stomach pain  that is severe, does not go away, or gets worse Stroke--sudden numbness or weakness of the face, arm, or leg, trouble speaking, confusion, trouble walking, loss of balance or coordination, dizziness, severe headache, change in vision Sudden and severe headache, confusion, change in vision, seizures, which may be signs of posterior reversible encephalopathy syndrome (PRES) Side effects that usually do not require medical attention (report to your care team if they continue or are bothersome): Back pain Change in taste Diarrhea Dry skin Increased tears Nosebleed This list may not describe all possible side effects. Call your doctor for medical advice about side effects. You may report side effects to FDA at 1-800-FDA-1088. Where should I keep my medication? This medication is given in a hospital or clinic. It will not be stored at home. NOTE: This sheet is a summary. It may not cover all possible information. If you have questions about this medicine, talk to your doctor, pharmacist, or health care provider.  2023 Elsevier/Gold Standard (2021-05-14 00:00:00)

## 2021-11-12 ENCOUNTER — Inpatient Hospital Stay: Payer: Medicare Other | Admitting: Licensed Clinical Social Worker

## 2021-11-12 NOTE — Progress Notes (Signed)
[facility] CSW Progress Note  Holiday representative met with patient to provide resources.  Patient stated she is having difficulty paying for food.  CSW provided patient with four (4) [employer] gift cards.  Contacted Otilio Carpen, who stated patient has $19 remaining on her [employer].  Provided patient with a bag of food.  Also discussed financial assistance from the [place] and provided literature for her to contact.  She expressed no other needs at this time.    Rodman Pickle Shahid Flori, LCSW    Patient is participating in a Managed Medicaid Plan:  Yes

## 2021-11-13 ENCOUNTER — Other Ambulatory Visit (HOSPITAL_COMMUNITY): Payer: Self-pay

## 2021-11-20 ENCOUNTER — Other Ambulatory Visit (HOSPITAL_COMMUNITY): Payer: Self-pay

## 2021-11-25 ENCOUNTER — Inpatient Hospital Stay: Payer: Medicare Other | Attending: Oncology

## 2021-11-25 ENCOUNTER — Other Ambulatory Visit: Payer: Self-pay | Admitting: *Deleted

## 2021-11-25 ENCOUNTER — Other Ambulatory Visit (HOSPITAL_COMMUNITY): Payer: Self-pay

## 2021-11-25 ENCOUNTER — Inpatient Hospital Stay: Payer: Medicare Other

## 2021-11-25 ENCOUNTER — Inpatient Hospital Stay (HOSPITAL_BASED_OUTPATIENT_CLINIC_OR_DEPARTMENT_OTHER): Payer: Medicare Other | Admitting: Oncology

## 2021-11-25 ENCOUNTER — Encounter: Payer: Self-pay | Admitting: *Deleted

## 2021-11-25 VITALS — BP 134/80 | HR 90 | Temp 98.1°F | Resp 18 | Ht 64.0 in | Wt 160.2 lb

## 2021-11-25 VITALS — BP 142/88

## 2021-11-25 DIAGNOSIS — C7951 Secondary malignant neoplasm of bone: Secondary | ICD-10-CM | POA: Insufficient documentation

## 2021-11-25 DIAGNOSIS — C187 Malignant neoplasm of sigmoid colon: Secondary | ICD-10-CM | POA: Diagnosis not present

## 2021-11-25 DIAGNOSIS — C20 Malignant neoplasm of rectum: Secondary | ICD-10-CM

## 2021-11-25 DIAGNOSIS — C786 Secondary malignant neoplasm of retroperitoneum and peritoneum: Secondary | ICD-10-CM | POA: Insufficient documentation

## 2021-11-25 DIAGNOSIS — Z79899 Other long term (current) drug therapy: Secondary | ICD-10-CM | POA: Diagnosis not present

## 2021-11-25 DIAGNOSIS — Z5112 Encounter for antineoplastic immunotherapy: Secondary | ICD-10-CM | POA: Insufficient documentation

## 2021-11-25 DIAGNOSIS — C7962 Secondary malignant neoplasm of left ovary: Secondary | ICD-10-CM | POA: Insufficient documentation

## 2021-11-25 LAB — CBC WITH DIFFERENTIAL (CANCER CENTER ONLY)
Abs Immature Granulocytes: 0.01 10*3/uL (ref 0.00–0.07)
Basophils Absolute: 0 10*3/uL (ref 0.0–0.1)
Basophils Relative: 1 %
Eosinophils Absolute: 0.3 10*3/uL (ref 0.0–0.5)
Eosinophils Relative: 7 %
HCT: 38 % (ref 36.0–46.0)
Hemoglobin: 13.2 g/dL (ref 12.0–15.0)
Immature Granulocytes: 0 %
Lymphocytes Relative: 51 %
Lymphs Abs: 2.5 10*3/uL (ref 0.7–4.0)
MCH: 31.7 pg (ref 26.0–34.0)
MCHC: 34.7 g/dL (ref 30.0–36.0)
MCV: 91.1 fL (ref 80.0–100.0)
Monocytes Absolute: 0.2 10*3/uL (ref 0.1–1.0)
Monocytes Relative: 4 %
Neutro Abs: 1.8 10*3/uL (ref 1.7–7.7)
Neutrophils Relative %: 37 %
Platelet Count: 270 10*3/uL (ref 150–400)
RBC: 4.17 MIL/uL (ref 3.87–5.11)
RDW: 14.6 % (ref 11.5–15.5)
WBC Count: 4.8 10*3/uL (ref 4.0–10.5)
nRBC: 0 % (ref 0.0–0.2)

## 2021-11-25 LAB — TOTAL PROTEIN, URINE DIPSTICK: Protein, ur: NEGATIVE mg/dL

## 2021-11-25 MED ORDER — SODIUM CHLORIDE 0.9 % IV SOLN: Freq: Once | INTRAVENOUS | Status: AC

## 2021-11-25 MED ORDER — LONSURF 20-8.19 MG PO TABS
ORAL_TABLET | ORAL | 0 refills | Status: DC
  Filled 2021-11-25: qty 50, fill #0
  Filled 2021-12-02: qty 50, 28d supply, fill #0

## 2021-11-25 MED ORDER — SODIUM CHLORIDE 0.9 % IV SOLN
5.0000 mg/kg | Freq: Once | INTRAVENOUS | Status: AC
  Administered 2021-11-25: 400 mg via INTRAVENOUS
  Filled 2021-11-25: qty 16

## 2021-11-25 NOTE — Progress Notes (Signed)
[facility] OFFICE PROGRESS NOTE   Diagnosis: Colon cancer  INTERVAL HISTORY:   April [name] complete another cycle of Lonsurf beginning 11/11/2021.  No nausea/vomiting or mouth sores.  She reports a few diarrhea stools per day for the past several days.  No bleeding or symptom of thrombosis.  She has stiffness and discomfort with motion at the left shoulder and upper arm.  She has intermittent sharp pain at the bilateral upper thigh.  Objective:  Vital signs in last 24 hours:  Blood pressure 134/80, pulse 90, temperature 98.1 F (36.7 C), temperature source Oral, resp. rate 18, height _0  (1.626 m), weight 160 lb 3.2 oz (72.7 kg), last menstrual period 01/07/2006, SpO2 100 %.    HEENT: No thrush or ulcers Resp: Lungs clear bilaterally Cardio: Regular rate and rhythm GI: No hepatosplenomegaly, nontender Vascular: No leg edema, left lower leg is slightly larger than the right side Musculoskeletal: Pain with abduction of the left shoulder.  No mass at the left upper arm.  Portacath/PICC-without erythema  Lab Results:  Lab Results  Component Value Date   WBC 4.8 11/25/2021   HGB 13.2 11/25/2021   HCT 38.0 11/25/2021   MCV 91.1 11/25/2021   PLT 270 11/25/2021   NEUTROABS 1.8 11/25/2021    CMP  Lab Results  Component Value Date   NA 137 10/28/2021   K 3.3 (L) 10/28/2021   CL 100 10/28/2021   CO2 25 10/28/2021   GLUCOSE 97 10/28/2021   BUN 11 10/28/2021   CREATININE 0.72 10/28/2021   CALCIUM 9.9 10/28/2021   PROT 7.9 10/28/2021   ALBUMIN 4.6 10/28/2021   AST 15 10/28/2021   ALT <5 10/28/2021   ALKPHOS 113 10/28/2021   BILITOT 0.4 10/28/2021   GFRNONAA >60 10/28/2021   GFRAA >60 10/17/2019    Lab Results  Component Value Date   CEA1 8.66 (H) 07/02/2020   CEA 5.50 (H) 10/04/2021     Medications: I have reviewed the patient's current medications.   Assessment/Plan: Sigmoid colon cancer, stage IV (pT4a,pN2b,M1c) Colonoscopy 03/24/2019-3  rectal polyps-hyperplastic polyps, distal colon biopsy-at least intramucosal adenocarcinoma, completely obstructing mass in the distal sigmoid colon, could not be traversed, intact mismatch repair protein expression 03/24/2019-CEA 56.1 03/29/2019 CT abdomen/pelvis-circumferential thickening involving the entire mid and distal sigmoid colon to the level of the rectosigmoid junction, solid/cystic mass in the left ovary, bilateral nephrolithiasis Robotic assisted low anterior resection, mesenteric lymphadenectomy, bilateral salpingo-oophorectomy 04/08/2019 Pathology ([facility] review of outside pathology) metastatic adenocarcinoma involving the peritoneum overlying the round ligament, serosa of the urinary bladder, serosa of the right ureter a sacral area, pelvic peritoneum, and left ovary.  Omental biopsy with focal mucin pools with no carcinoma cells identified, right hemidiaphragm biopsy involved by metastatic adenocarcinoma, invasive adenocarcinoma the sigmoid colon, moderately differentiated, T4a, perineural and vascular invasion present, 7/22 lymph nodes, multiple tumor deposits, resection margins negative Negative for PD-L1, low probability of MSI-high, HER-2 negative, negative for BRAF, NRAS and KRAS alterations CTs 05/19/2019-no evidence of metastatic disease, findings suspicious for colitis of the transverse and ascending colon, small amount of ascites in the cul-de-sac, bilateral renal calculi Cycle 1 FOLFIRI 05/24/2019, bevacizumab added with cycle 2 Cycle 4 FOLFIRI/bevacizumab 07/05/2019 Cycle 5 FOLFIRI 07/27/2019, bevacizumab held secondary to hypertension Cycle 6 FOLFIRI 08/10/1999, bevacizumab held secondary to hypertension CTs 08/30/2019-no evidence of recurrent disease, new subsolid right lower lobe nodule felt to be inflammatory, emphysema Cycle 7 FOLFIRI 09/26/2019, bevacizumab remains on hold secondary to hypertension Cycle 8 FOLFIRI 10/17/2019, bevacizumab  held secondary to hypertension, Udenyca added  for neutropenia Cycle 9 FOLFIRI 11/07/2019, bevacizumab held secondary to hypertension, Udenyca Cycle 10 FOLFIRI 11/28/2019, bevacizumab held, Udenyca Cycle 11 FOLFIRI 12/26/2019, bevacizumab held, Udenyca CTs 01/25/2020-no evidence of recurrent disease, resolution of right lower lobe nodule Maintenance Xeloda beginning 02/06/2020 CTs 04/25/2020- no evidence of metastatic disease, multiple bilateral renal calculi without hydronephrosis Maintenance Xeloda continued CT abdomen/pelvis without contrast 08/10/2020-bilateral staghorn renal calculi, no evidence of metastatic disease; addendum 08/23/2020-small but increasing omental nodules. Cycle 1 FOLFIRI/panitumumab 09/19/2020 Cycle 2 FOLFIRI/Panitumumab 10/03/2020 Cycle 3 FOLFIRI/Panitumumab 10/17/2020 Cycle 4 FOLFIRI/panitumumab 10/31/2020 Cycle 5 FOLFIRI/Panitumumab 11/14/2020 CTs 11/26/2020-stable omental metastases compared to 10/22/2020, mildly decreased from 08/10/2020.  Left lower quadrant tiny paracolic gutter implant slightly decreased from CT 08/10/2020.  No new or progressive metastatic disease in the abdomen or pelvis. Cycle 6 FOLFIRI/Panitumumab 11/28/2020, irinotecan dose reduced, treatment schedule adjusted to every 3 weeks going forward Cycle 7 FOLFIRI/panitumumab 12/24/2020 Cycle 8 FOLFIRI/Panitumumab 01/16/2021--treatment held due to hypertension in the infusion area. Cycle 8 FOLFIRI/panitumumab 01/21/2021 Cycle 9 FOLFIRI/panitumumab 02/11/2021 Cycle 10 FOLFIRI/Panitumumab 03/04/2021 Cycle 11 FOLFIRI/Panitumumab 03/25/2021 CT abdomen/pelvis 04/10/2021-stable right omental and left posterior paracolic gutter nodules Cycle 12 FOLFIRI/panitumumab 04/15/2021 Cycle 13 FOLFIRI/panitumumab 05/06/2021 Cycle 14 FOLFIRI/Panitumumab 05/27/2021 Cycle 15 FOLFIRI/Panitumumab 06/17/2021 CT abdomen/pelvis 07/05/2021-slight enlargement of a dominant right omental implant, no new implants Patient requested a treatment break CT abdomen/pelvis 10/04/2021-mild increase in  size of multiple omental soft tissue nodules, 4 mm calculus in the distal left ureter adjacent to the ureteral stent Cycle 1 Lonsurf 10/14/2021 10/28/2021 Avastin every 2 weeks Cycle 2 Lonsurf 11/11/2021   Hypertension G4 P3, twins Kidney stones-bilateral staghorn renal calculi on CT 08/10/2020 Infected Port-A-Cath 09/12/2019-placed on Augmentin, referred for Port-A-Cath removal; Port-A-Cath removed 09/14/2019; PICC line placed 09/14/2019; culture staph aureus.  Course of Septra completed. Neutropenia secondary to chemotherapy-Udenyca added with cycle 8 FOLFIRI Right nephrostomy tube 09/12/2020; stent placed 10/09/2020, percutaneous nephrostolithotomy treatment of right-sided kidney stones 10/09/2020, malpositioned right ureter stent replacement 10/23/2020, right ureter stent removed 10/29/2020 Percutaneous left nephrostolithotomy 10/01/2021-no renal stone identified, left ureter stent left in place Cystoscopy 10/14/2021-stent removed      Disposition: Ms. Buen[place]entura appears stable.  She continues every 2-week bevacizumab.  She will return for an office visit prior to cycle 3 Lonsurf on 12/09/2021.  She will call for increased pain at the left shoulder.  Betsy Coder, MD  11/25/2021  9:53 AM

## 2021-11-25 NOTE — Progress Notes (Signed)
Patient seen by Dr. Benay Spice today  Vitals are within treatment parameters.  Labs reviewed by Dr. Benay Spice and are within treatment parameters. Per Dr. Benay Spice: Does not need CMP this treatment.  Per physician team, patient is ready for treatment and there are NO modifications to the treatment plan.

## 2021-11-25 NOTE — Patient Instructions (Signed)
[facility]  Discharge Instructions: Thank you for choosing [facility] to provide your oncology and hematology care.   If you have a lab appointment with the [facility], please go directly to the [facility] and check in at the registration area.   Wear comfortable clothing and clothing appropriate for easy access to any Portacath or PICC line.   We strive to give you quality time with your provider. You may need to reschedule your appointment if you arrive late (15 or more minutes).  Arriving late affects you and other patients whose appointments are after yours.  Also, if you miss three or more appointments without notifying the office, you may be dismissed from the clinic at the provider's discretion.      For prescription refill requests, have your pharmacy contact our office and allow 72 hours for refills to be completed.    Today you received the following chemotherapy and/or immunotherapy agents Bevacizumab      To help prevent nausea and vomiting after your treatment, we encourage you to take your nausea medication as directed.  BELOW ARE SYMPTOMS THAT SHOULD BE REPORTED IMMEDIATELY: *FEVER GREATER THAN 100.4 F (38 C) OR HIGHER *CHILLS OR SWEATING *NAUSEA AND VOMITING THAT IS NOT CONTROLLED WITH YOUR NAUSEA MEDICATION *UNUSUAL SHORTNESS OF BREATH *UNUSUAL BRUISING OR BLEEDING *URINARY PROBLEMS (pain or burning when urinating, or frequent urination) *BOWEL PROBLEMS (unusual diarrhea, constipation, pain near the anus) TENDERNESS IN MOUTH AND THROAT WITH OR WITHOUT PRESENCE OF ULCERS (sore throat, sores in mouth, or a toothache) UNUSUAL RASH, SWELLING OR PAIN  UNUSUAL VAGINAL DISCHARGE OR ITCHING   Items with * indicate a potential emergency and should be followed up as soon as possible or go to the Emergency Department if any problems should occur.  Please show the CHEMOTHERAPY ALERT CARD or IMMUNOTHERAPY ALERT CARD at check-in to  the Emergency Department and triage nurse.  Should you have questions after your visit or need to cancel or reschedule your appointment, please contact [facility]  Dept: [phone]  and follow the prompts.  Office hours are 8:00 a.m. to 4:30 p.m. Monday - Friday. Please note that voicemails left after 4:00 p.m. may not be returned until the following business day.  We are closed weekends and major holidays. You have access to a nurse at all times for urgent questions. Please call the main number to the clinic Dept: [phone] and follow the prompts.   For any non-urgent questions, you may also contact your provider using MyChart. We now offer e-Visits for anyone 4 and older to request care online for non-urgent symptoms. For details visit [email or web address].   Also download the MyChart app! Go to the app store, search "MyChart", open the app, select [facility], and log in with your MyChart username and password.  Masks are optional in the cancer centers. If you would like for your care team to wear a mask while they are taking care of you, please let them know. You may have one support person who is at least 48 years old accompany you for your appointments.  Bevacizumab Injection What is this medication? BEVACIZUMAB (be va SIZ yoo mab) treats some types of cancer. It works by blocking a protein that causes cancer cells to grow and multiply. This helps to slow or stop the spread of cancer cells. It is a monoclonal antibody. This medicine may be used for other purposes; ask your health care provider or  pharmacist if you have questions. COMMON BRAND NAME(S): Alymsys, Avastin, MVASI, Noah Charon What should I tell my care team before I take this medication? They need to know if you have any of these conditions: Blood clots Coughing up blood Having or recent surgery Heart failure High blood pressure History of a connection between 2 or more body parts that do not  usually connect (fistula) History of a tear in your stomach or intestines Protein in your urine An unusual or allergic reaction to bevacizumab, other medications, foods, dyes, or preservatives Pregnant or trying to get pregnant Breast-feeding How should I use this medication? This medication is injected into a vein. It is given by your care team in a hospital or clinic setting. Talk to your care team the use of this medication in children. Special care may be needed. Overdosage: If you think you have taken too much of this medicine contact a poison control center or emergency room at once. NOTE: This medicine is only for you. Do not share this medicine with others. What if I miss a dose? Keep appointments for follow-up doses. It is important not to miss your dose. Call your care team if you are unable to keep an appointment. What may interact with this medication? Interactions are not expected. This list may not describe all possible interactions. Give your health care provider a list of all the medicines, herbs, non-prescription drugs, or dietary supplements you use. Also tell them if you smoke, drink alcohol, or use illegal drugs. Some items may interact with your medicine. What should I watch for while using this medication? Your condition will be monitored carefully while you are receiving this medication. You may need blood work while taking this medication. This medication may make you feel generally unwell. This is not uncommon as chemotherapy can affect healthy cells as well as cancer cells. Report any side effects. Continue your course of treatment even though you feel ill unless your care team tells you to stop. This medication may increase your risk to bruise or bleed. Call your care team if you notice any unusual bleeding. Before having surgery, talk to your care team to make sure it is ok. This medication can increase the risk of poor healing of your surgical site or wound. You will  need to stop this medication for 28 days before surgery. After surgery, wait at least 28 days before restarting this medication. Make sure the surgical site or wound is healed enough before restarting this medication. Talk to your care team if questions. Talk to your care team if you may be pregnant. Serious birth defects can occur if you take this medication during pregnancy and for 6 months after the last dose. Contraception is recommended while taking this medication and for 6 months after the last dose. Your care team can help you find the option that works for you. Do not breastfeed while taking this medication and for 6 months after the last dose. This medication can cause infertility. Talk to your care team if you are concerned about your fertility. What side effects may I notice from receiving this medication? Side effects that you should report to your care team as soon as possible: Allergic reactions--skin rash, itching, hives, swelling of the face, lips, tongue, or throat Bleeding--bloody or black, tar-like stools, vomiting blood or brown material that looks like coffee grounds, red or dark brown urine, small red or purple spots on skin, unusual bruising or bleeding Blood clot--pain, swelling, or warmth in the leg,  shortness of breath, chest pain Heart attack--pain or tightness in the chest, shoulders, arms, or jaw, nausea, shortness of breath, cold or clammy skin, feeling faint or lightheaded Heart failure--shortness of breath, swelling of the ankles, feet, or hands, sudden weight gain, unusual weakness or fatigue Increase in blood pressure Infection--fever, chills, cough, sore throat, wounds that don't heal, pain or trouble when passing urine, general feeling of discomfort or being unwell Infusion reactions--chest pain, shortness of breath or trouble breathing, feeling faint or lightheaded Kidney injury--decrease in the amount of urine, swelling of the ankles, hands, or feet Stomach pain  that is severe, does not go away, or gets worse Stroke--sudden numbness or weakness of the face, arm, or leg, trouble speaking, confusion, trouble walking, loss of balance or coordination, dizziness, severe headache, change in vision Sudden and severe headache, confusion, change in vision, seizures, which may be signs of posterior reversible encephalopathy syndrome (PRES) Side effects that usually do not require medical attention (report to your care team if they continue or are bothersome): Back pain Change in taste Diarrhea Dry skin Increased tears Nosebleed This list may not describe all possible side effects. Call your doctor for medical advice about side effects. You may report side effects to FDA at 1-800-FDA-1088. Where should I keep my medication? This medication is given in a hospital or clinic. It will not be stored at home. NOTE: This sheet is a summary. It may not cover all possible information. If you have questions about this medicine, talk to your doctor, pharmacist, or health care provider.  2023 Elsevier/Gold Standard (2021-05-14 00:00:00)

## 2021-11-27 ENCOUNTER — Other Ambulatory Visit (HOSPITAL_COMMUNITY): Payer: Self-pay

## 2021-11-29 ENCOUNTER — Other Ambulatory Visit (HOSPITAL_COMMUNITY): Payer: Self-pay

## 2021-12-02 ENCOUNTER — Other Ambulatory Visit (HOSPITAL_COMMUNITY): Payer: Self-pay

## 2021-12-05 ENCOUNTER — Encounter: Payer: Self-pay | Admitting: Oncology

## 2021-12-09 ENCOUNTER — Inpatient Hospital Stay: Payer: Medicare Other | Admitting: Nurse Practitioner

## 2021-12-09 ENCOUNTER — Inpatient Hospital Stay: Payer: Medicare Other

## 2021-12-09 ENCOUNTER — Other Ambulatory Visit (HOSPITAL_COMMUNITY): Payer: Self-pay

## 2021-12-10 ENCOUNTER — Inpatient Hospital Stay: Payer: Medicare Other

## 2021-12-10 ENCOUNTER — Inpatient Hospital Stay (HOSPITAL_BASED_OUTPATIENT_CLINIC_OR_DEPARTMENT_OTHER): Payer: Medicare Other | Admitting: Nurse Practitioner

## 2021-12-10 ENCOUNTER — Encounter: Payer: Self-pay | Admitting: Nurse Practitioner

## 2021-12-10 VITALS — BP 180/98

## 2021-12-10 VITALS — BP 150/84 | HR 86 | Temp 98.1°F | Resp 18 | Ht 64.0 in | Wt 163.0 lb

## 2021-12-10 DIAGNOSIS — C786 Secondary malignant neoplasm of retroperitoneum and peritoneum: Secondary | ICD-10-CM

## 2021-12-10 DIAGNOSIS — C7951 Secondary malignant neoplasm of bone: Secondary | ICD-10-CM | POA: Diagnosis not present

## 2021-12-10 DIAGNOSIS — C20 Malignant neoplasm of rectum: Secondary | ICD-10-CM | POA: Diagnosis not present

## 2021-12-10 DIAGNOSIS — C187 Malignant neoplasm of sigmoid colon: Secondary | ICD-10-CM | POA: Diagnosis not present

## 2021-12-10 DIAGNOSIS — Z5112 Encounter for antineoplastic immunotherapy: Secondary | ICD-10-CM | POA: Diagnosis not present

## 2021-12-10 DIAGNOSIS — Z79899 Other long term (current) drug therapy: Secondary | ICD-10-CM | POA: Diagnosis not present

## 2021-12-10 DIAGNOSIS — C19 Malignant neoplasm of rectosigmoid junction: Secondary | ICD-10-CM

## 2021-12-10 LAB — CBC WITH DIFFERENTIAL (CANCER CENTER ONLY)
Abs Immature Granulocytes: 0.01 10*3/uL (ref 0.00–0.07)
Basophils Absolute: 0.1 10*3/uL (ref 0.0–0.1)
Basophils Relative: 1 %
Eosinophils Absolute: 0.2 10*3/uL (ref 0.0–0.5)
Eosinophils Relative: 4 %
HCT: 35.9 % — ABNORMAL LOW (ref 36.0–46.0)
Hemoglobin: 12.4 g/dL (ref 12.0–15.0)
Immature Granulocytes: 0 %
Lymphocytes Relative: 43 %
Lymphs Abs: 2.5 10*3/uL (ref 0.7–4.0)
MCH: 31.7 pg (ref 26.0–34.0)
MCHC: 34.5 g/dL (ref 30.0–36.0)
MCV: 91.8 fL (ref 80.0–100.0)
Monocytes Absolute: 0.4 10*3/uL (ref 0.1–1.0)
Monocytes Relative: 6 %
Neutro Abs: 2.6 10*3/uL (ref 1.7–7.7)
Neutrophils Relative %: 46 %
Platelet Count: 343 10*3/uL (ref 150–400)
RBC: 3.91 MIL/uL (ref 3.87–5.11)
RDW: 17.2 % — ABNORMAL HIGH (ref 11.5–15.5)
WBC Count: 5.7 10*3/uL (ref 4.0–10.5)
nRBC: 0 % (ref 0.0–0.2)

## 2021-12-10 LAB — CMP (CANCER CENTER ONLY)
ALT: 11 U/L (ref 0–44)
AST: 18 U/L (ref 15–41)
Albumin: 4.6 g/dL (ref 3.5–5.0)
Alkaline Phosphatase: 100 U/L (ref 38–126)
Anion gap: 13 (ref 5–15)
BUN: 11 mg/dL (ref 6–20)
CO2: 26 mmol/L (ref 22–32)
Calcium: 9.6 mg/dL (ref 8.9–10.3)
Chloride: 100 mmol/L (ref 98–111)
Creatinine: 0.72 mg/dL (ref 0.44–1.00)
GFR, Estimated: >60 mL/min (ref >60)
Glucose, Bld: 85 mg/dL (ref 70–99)
Potassium: 3 mmol/L — ABNORMAL LOW (ref 3.5–5.1)
Sodium: 139 mmol/L (ref 135–145)
Total Bilirubin: 0.4 mg/dL (ref 0.3–1.2)
Total Protein: 7.8 g/dL (ref 6.5–8.1)

## 2021-12-10 LAB — CEA (ACCESS): CEA (CHCC): 11.19 ng/mL — ABNORMAL HIGH (ref 0.00–5.00)

## 2021-12-10 MED ORDER — SODIUM CHLORIDE 0.9 % IV SOLN
5.0000 mg/kg | Freq: Once | INTRAVENOUS | Status: AC
  Administered 2021-12-10: 400 mg via INTRAVENOUS
  Filled 2021-12-10: qty 16

## 2021-12-10 MED ORDER — PROCHLORPERAZINE MALEATE 10 MG PO TABS
10.0000 mg | ORAL_TABLET | Freq: Once | ORAL | Status: AC
  Administered 2021-12-10: 10 mg via ORAL
  Filled 2021-12-10: qty 1

## 2021-12-10 MED ORDER — SODIUM CHLORIDE 0.9 % IV SOLN: Freq: Once | INTRAVENOUS | Status: AC

## 2021-12-10 NOTE — Progress Notes (Signed)
[facility] OFFICE PROGRESS NOTE   Diagnosis: Colon cancer  INTERVAL HISTORY:   Ms. Resler returns as scheduled.  She began cycle 3 Lonsurf 12/09/2021.  She continues Avastin every 2 weeks.  No significant issues with nausea/vomiting.  She takes Compazine as needed with good relief.  No mouth sores.  No diarrhea.  Recent constipation.  No hand or foot pain or redness.  No bleeding.  Objective:  Vital signs in last 24 hours:  Blood pressure (!) 150/84, pulse 86, temperature 98.1 F (36.7 C), temperature source Oral, resp. rate 18, height _0  (1.626 m), weight 163 lb (73.9 kg), last menstrual period 01/07/2006, SpO2 100 %.    HEENT: No thrush or ulcers. Resp: Lungs clear bilaterally. Cardio: Regular rate and rhythm. GI: Abdomen soft and nontender.  No hepatosplenomegaly. Vascular: No leg edema.  Calves soft and nontender. Skin: Palms without erythema. Port-A-Cath without erythema.   Lab Results:  Lab Results  Component Value Date   WBC 5.7 12/10/2021   HGB 12.4 12/10/2021   HCT 35.9 (L) 12/10/2021   MCV 91.8 12/10/2021   PLT 343 12/10/2021   NEUTROABS 2.6 12/10/2021    Imaging:  No results found.  Medications: I have reviewed the patient's current medications.  Assessment/Plan: Sigmoid colon cancer, stage IV (pT4a,pN2b,M1c) Colonoscopy 03/24/2019-3 rectal polyps-hyperplastic polyps, distal colon biopsy-at least intramucosal adenocarcinoma, completely obstructing mass in the distal sigmoid colon, could not be traversed, intact mismatch repair protein expression 03/24/2019-CEA 56.1 03/29/2019 CT abdomen/pelvis-circumferential thickening involving the entire mid and distal sigmoid colon to the level of the rectosigmoid junction, solid/cystic mass in the left ovary, bilateral nephrolithiasis Robotic assisted low anterior resection, mesenteric lymphadenectomy, bilateral salpingo-oophorectomy 04/08/2019 Pathology ([facility] review of outside pathology)  metastatic adenocarcinoma involving the peritoneum overlying the round ligament, serosa of the urinary bladder, serosa of the right ureter a sacral area, pelvic peritoneum, and left ovary.  Omental biopsy with focal mucin pools with no carcinoma cells identified, right hemidiaphragm biopsy involved by metastatic adenocarcinoma, invasive adenocarcinoma the sigmoid colon, moderately differentiated, T4a, perineural and vascular invasion present, 7/22 lymph nodes, multiple tumor deposits, resection margins negative Negative for PD-L1, low probability of MSI-high, HER-2 negative, negative for BRAF, NRAS and KRAS alterations CTs 05/19/2019-no evidence of metastatic disease, findings suspicious for colitis of the transverse and ascending colon, small amount of ascites in the cul-de-sac, bilateral renal calculi Cycle 1 FOLFIRI 05/24/2019, bevacizumab added with cycle 2 Cycle 4 FOLFIRI/bevacizumab 07/05/2019 Cycle 5 FOLFIRI 07/27/2019, bevacizumab held secondary to hypertension Cycle 6 FOLFIRI 08/10/1999, bevacizumab held secondary to hypertension CTs 08/30/2019-no evidence of recurrent disease, new subsolid right lower lobe nodule felt to be inflammatory, emphysema Cycle 7 FOLFIRI 09/26/2019, bevacizumab remains on hold secondary to hypertension Cycle 8 FOLFIRI 10/17/2019, bevacizumab held secondary to hypertension, Udenyca added for neutropenia Cycle 9 FOLFIRI 11/07/2019, bevacizumab held secondary to hypertension, Udenyca Cycle 10 FOLFIRI 11/28/2019, bevacizumab held, Udenyca Cycle 11 FOLFIRI 12/26/2019, bevacizumab held, Udenyca CTs 01/25/2020-no evidence of recurrent disease, resolution of right lower lobe nodule Maintenance Xeloda beginning 02/06/2020 CTs 04/25/2020- no evidence of metastatic disease, multiple bilateral renal calculi without hydronephrosis Maintenance Xeloda continued CT abdomen/pelvis without contrast 08/10/2020-bilateral staghorn renal calculi, no evidence of metastatic disease; addendum  08/23/2020-small but increasing omental nodules. Cycle 1 FOLFIRI/panitumumab 09/19/2020 Cycle 2 FOLFIRI/Panitumumab 10/03/2020 Cycle 3 FOLFIRI/Panitumumab 10/17/2020 Cycle 4 FOLFIRI/panitumumab 10/31/2020 Cycle 5 FOLFIRI/Panitumumab 11/14/2020 CTs 11/26/2020-stable omental metastases compared to 10/22/2020, mildly decreased from 08/10/2020.  Left lower quadrant tiny paracolic gutter implant slightly decreased from CT 08/10/2020.  No new or progressive metastatic disease in the abdomen or pelvis. Cycle 6 FOLFIRI/Panitumumab 11/28/2020, irinotecan dose reduced, treatment schedule adjusted to every 3 weeks going forward Cycle 7 FOLFIRI/panitumumab 12/24/2020 Cycle 8 FOLFIRI/Panitumumab 01/16/2021--treatment held due to hypertension in the infusion area. Cycle 8 FOLFIRI/panitumumab 01/21/2021 Cycle 9 FOLFIRI/panitumumab 02/11/2021 Cycle 10 FOLFIRI/Panitumumab 03/04/2021 Cycle 11 FOLFIRI/Panitumumab 03/25/2021 CT abdomen/pelvis 04/10/2021-stable right omental and left posterior paracolic gutter nodules Cycle 12 FOLFIRI/panitumumab 04/15/2021 Cycle 13 FOLFIRI/panitumumab 05/06/2021 Cycle 14 FOLFIRI/Panitumumab 05/27/2021 Cycle 15 FOLFIRI/Panitumumab 06/17/2021 CT abdomen/pelvis 07/05/2021-slight enlargement of a dominant right omental implant, no new implants Patient requested a treatment break CT abdomen/pelvis 10/04/2021-mild increase in size of multiple omental soft tissue nodules, 4 mm calculus in the distal left ureter adjacent to the ureteral stent Cycle 1 Lonsurf 10/14/2021 10/28/2021 Avastin every 2 weeks Cycle 2 Lonsurf 11/11/2021 Cycle 3 Lonsurf 12/09/2021   Hypertension G4 P3, twins Kidney stones-bilateral staghorn renal calculi on CT 08/10/2020 Infected Port-A-Cath 09/12/2019-placed on Augmentin, referred for Port-A-Cath removal; Port-A-Cath removed 09/14/2019; PICC line placed 09/14/2019; culture staph aureus.  Course of Septra completed. Neutropenia secondary to chemotherapy-Udenyca added with cycle 8  FOLFIRI Right nephrostomy tube 09/12/2020; stent placed 10/09/2020, percutaneous nephrostolithotomy treatment of right-sided kidney stones 10/09/2020, malpositioned right ureter stent replacement 10/23/2020, right ureter stent removed 10/29/2020 Percutaneous left nephrostolithotomy 10/01/2021-no renal stone identified, left ureter stent left in place Cystoscopy 10/14/2021-stent removed    Disposition: Ms. Horiuchi appears stable.  She began cycle 3 Lonsurf yesterday.  She continues every 2-week Avastin.  CBC and chemistry panel reviewed.  Labs adequate to proceed as above.  She has persistent hypokalemia.  She is not taking potassium as prescribed.  She will take 20 meq  twice daily for the next 3 days and then resume 20 meq daily.  She will return for follow-up and Avastin in 2 weeks.  She will contact the office in the interim with any problems.    Ned Card ANP/GNP-BC   12/10/2021  2:42 PM

## 2021-12-10 NOTE — Patient Instructions (Addendum)
[facility]  Discharge Instructions: Thank you for choosing [facility] to provide your oncology and hematology care.   If you have a lab appointment with the [facility], please go directly to the [facility] and check in at the registration area.   Wear comfortable clothing and clothing appropriate for easy access to any Portacath or PICC line.   We strive to give you quality time with your provider. You may need to reschedule your appointment if you arrive late (15 or more minutes).  Arriving late affects you and other patients whose appointments are after yours.  Also, if you miss three or more appointments without notifying the office, you may be dismissed from the clinic at the provider's discretion.      For prescription refill requests, have your pharmacy contact our office and allow 72 hours for refills to be completed.    Today you received the following chemotherapy and/or immunotherapy agents Bevacizumab-bvzr      To help prevent nausea and vomiting after your treatment, we encourage you to take your nausea medication as directed.  BELOW ARE SYMPTOMS THAT SHOULD BE REPORTED IMMEDIATELY: *FEVER GREATER THAN 100.4 F (38 C) OR HIGHER *CHILLS OR SWEATING *NAUSEA AND VOMITING THAT IS NOT CONTROLLED WITH YOUR NAUSEA MEDICATION *UNUSUAL SHORTNESS OF BREATH *UNUSUAL BRUISING OR BLEEDING *URINARY PROBLEMS (pain or burning when urinating, or frequent urination) *BOWEL PROBLEMS (unusual diarrhea, constipation, pain near the anus) TENDERNESS IN MOUTH AND THROAT WITH OR WITHOUT PRESENCE OF ULCERS (sore throat, sores in mouth, or a toothache) UNUSUAL RASH, SWELLING OR PAIN  UNUSUAL VAGINAL DISCHARGE OR ITCHING   Items with * indicate a potential emergency and should be followed up as soon as possible or go to the Emergency Department if any problems should occur.  Please show the CHEMOTHERAPY ALERT CARD or IMMUNOTHERAPY ALERT CARD at check-in  to the Emergency Department and triage nurse.  Should you have questions after your visit or need to cancel or reschedule your appointment, please contact [facility]  Dept: [phone]  and follow the prompts.  Office hours are 8:00 a.m. to 4:30 p.m. Monday - Friday. Please note that voicemails left after 4:00 p.m. may not be returned until the following business day.  We are closed weekends and major holidays. You have access to a nurse at all times for urgent questions. Please call the main number to the clinic Dept: [phone] and follow the prompts.   For any non-urgent questions, you may also contact your provider using MyChart. We now offer e-Visits for anyone 26 and older to request care online for non-urgent symptoms. For details visit [email or web address].   Also download the MyChart app! Go to the app store, search "MyChart", open the app, select [facility], and log in with your MyChart username and password.  Masks are optional in the cancer centers. If you would like for your care team to wear a mask while they are taking care of you, please let them know. You may have one support person who is at least 48 years old accompany you for your appointments.

## 2021-12-10 NOTE — Progress Notes (Signed)
Patient presents for treatment. RN assessment completed along with the following:  Labs/vitals reviewed - Yes, and within treatment parameters.   Weight within 10% of previous measurement - Yes Informed consent completed and reflects current therapy/intent - Yes, on date 10/28/21             Provider progress note reviewed - Today's provider note is not yet available. I reviewed the most recent oncology provider progress note in chart dated 11/25/21. Treatment/Antibody/Supportive plan reviewed - Yes, and there are no adjustments needed for today's treatment. S&H and other orders reviewed - Yes, and there are no additional orders identified. Previous treatment date reviewed - Yes, and the appropriate amount of time has elapsed between treatments. Clinic Hand Off Received from - Ned Card, NP  Patient to proceed with treatment.   After Bevacizumab infusion BP elevated (see flowsheets). Pt denies symptoms of HTN. Pt states that she will take her next BP medication this evening and that she monitors her BP at home. Pt verbalizes understanding to check her BP at home this evening to make sure it returns to her normal range. Pt verbalizes understanding to follow-up with PCP/cardiologist if HTN persists.

## 2021-12-20 ENCOUNTER — Other Ambulatory Visit (HOSPITAL_COMMUNITY): Payer: Self-pay

## 2021-12-22 ENCOUNTER — Other Ambulatory Visit: Payer: Self-pay | Admitting: Oncology

## 2021-12-23 ENCOUNTER — Other Ambulatory Visit: Payer: Self-pay | Admitting: *Deleted

## 2021-12-23 ENCOUNTER — Inpatient Hospital Stay: Payer: Medicare Other

## 2021-12-23 ENCOUNTER — Other Ambulatory Visit (HOSPITAL_COMMUNITY): Payer: Self-pay

## 2021-12-23 ENCOUNTER — Encounter: Payer: Self-pay | Admitting: *Deleted

## 2021-12-23 ENCOUNTER — Inpatient Hospital Stay: Payer: Medicare Other | Attending: Oncology | Admitting: Oncology

## 2021-12-23 ENCOUNTER — Inpatient Hospital Stay: Payer: Medicare Other | Attending: Oncology

## 2021-12-23 VITALS — BP 160/97

## 2021-12-23 VITALS — BP 145/90 | HR 86 | Temp 98.2°F | Resp 18 | Ht 64.0 in | Wt 166.6 lb

## 2021-12-23 DIAGNOSIS — C187 Malignant neoplasm of sigmoid colon: Secondary | ICD-10-CM | POA: Insufficient documentation

## 2021-12-23 DIAGNOSIS — Z9221 Personal history of antineoplastic chemotherapy: Secondary | ICD-10-CM | POA: Diagnosis not present

## 2021-12-23 DIAGNOSIS — C786 Secondary malignant neoplasm of retroperitoneum and peritoneum: Secondary | ICD-10-CM | POA: Diagnosis not present

## 2021-12-23 DIAGNOSIS — C20 Malignant neoplasm of rectum: Secondary | ICD-10-CM | POA: Diagnosis not present

## 2021-12-23 DIAGNOSIS — C7952 Secondary malignant neoplasm of bone marrow: Secondary | ICD-10-CM | POA: Insufficient documentation

## 2021-12-23 DIAGNOSIS — I10 Essential (primary) hypertension: Secondary | ICD-10-CM

## 2021-12-23 DIAGNOSIS — E876 Hypokalemia: Secondary | ICD-10-CM | POA: Insufficient documentation

## 2021-12-23 DIAGNOSIS — Z79899 Other long term (current) drug therapy: Secondary | ICD-10-CM | POA: Insufficient documentation

## 2021-12-23 DIAGNOSIS — C7951 Secondary malignant neoplasm of bone: Secondary | ICD-10-CM | POA: Diagnosis not present

## 2021-12-23 DIAGNOSIS — Z5112 Encounter for antineoplastic immunotherapy: Secondary | ICD-10-CM | POA: Insufficient documentation

## 2021-12-23 LAB — CBC WITH DIFFERENTIAL (CANCER CENTER ONLY)
Abs Immature Granulocytes: 0.01 10*3/uL (ref 0.00–0.07)
Basophils Absolute: 0.1 10*3/uL (ref 0.0–0.1)
Basophils Relative: 1 %
Eosinophils Absolute: 1.2 10*3/uL — ABNORMAL HIGH (ref 0.0–0.5)
Eosinophils Relative: 17 %
HCT: 38.9 % (ref 36.0–46.0)
Hemoglobin: 13.1 g/dL (ref 12.0–15.0)
Immature Granulocytes: 0 %
Lymphocytes Relative: 39 %
Lymphs Abs: 2.8 10*3/uL (ref 0.7–4.0)
MCH: 32.1 pg (ref 26.0–34.0)
MCHC: 33.7 g/dL (ref 30.0–36.0)
MCV: 95.3 fL (ref 80.0–100.0)
Monocytes Absolute: 0.5 10*3/uL (ref 0.1–1.0)
Monocytes Relative: 6 %
Neutro Abs: 2.7 10*3/uL (ref 1.7–7.7)
Neutrophils Relative %: 37 %
Platelet Count: 267 10*3/uL (ref 150–400)
RBC: 4.08 MIL/uL (ref 3.87–5.11)
RDW: 18.6 % — ABNORMAL HIGH (ref 11.5–15.5)
WBC Count: 7.3 10*3/uL (ref 4.0–10.5)
nRBC: 0 % (ref 0.0–0.2)

## 2021-12-23 LAB — CMP (CANCER CENTER ONLY)
ALT: 7 U/L (ref 0–44)
AST: 15 U/L (ref 15–41)
Albumin: 4.5 g/dL (ref 3.5–5.0)
Alkaline Phosphatase: 103 U/L (ref 38–126)
Anion gap: 10 (ref 5–15)
BUN: 9 mg/dL (ref 6–20)
CO2: 26 mmol/L (ref 22–32)
Calcium: 9.5 mg/dL (ref 8.9–10.3)
Chloride: 104 mmol/L (ref 98–111)
Creatinine: 0.64 mg/dL (ref 0.44–1.00)
GFR, Estimated: >60 mL/min (ref >60)
Glucose, Bld: 85 mg/dL (ref 70–99)
Potassium: 3.8 mmol/L (ref 3.5–5.1)
Sodium: 140 mmol/L (ref 135–145)
Total Bilirubin: 0.4 mg/dL (ref 0.3–1.2)
Total Protein: 7.5 g/dL (ref 6.5–8.1)

## 2021-12-23 LAB — TOTAL PROTEIN, URINE DIPSTICK: Protein, ur: NEGATIVE mg/dL

## 2021-12-23 LAB — CEA (ACCESS): CEA (CHCC): 11.68 ng/mL — ABNORMAL HIGH (ref 0.00–5.00)

## 2021-12-23 MED ORDER — SODIUM CHLORIDE 0.9 % IV SOLN
5.0000 mg/kg | Freq: Once | INTRAVENOUS | Status: AC
  Administered 2021-12-23: 400 mg via INTRAVENOUS
  Filled 2021-12-23: qty 16

## 2021-12-23 MED ORDER — HYDRALAZINE HCL 10 MG PO TABS: 10.0000 mg | ORAL_TABLET | Freq: Three times a day (TID) | ORAL | 3 refills | Status: DC

## 2021-12-23 MED ORDER — HYDROCODONE-ACETAMINOPHEN 10-325 MG PO TABS: 1.0000 | ORAL_TABLET | Freq: Four times a day (QID) | ORAL | 0 refills | Status: DC | PRN

## 2021-12-23 MED ORDER — LONSURF 20-8.19 MG PO TABS
ORAL_TABLET | ORAL | 0 refills | Status: DC
  Filled 2021-12-23: qty 50, fill #0
  Filled 2021-12-30: qty 50, 28d supply, fill #0

## 2021-12-23 MED ORDER — SODIUM CHLORIDE 0.9 % IV SOLN: Freq: Once | INTRAVENOUS | Status: AC

## 2021-12-23 NOTE — Progress Notes (Signed)
Patient presents for treatment. RN assessment completed along with the following:  Labs/vitals reviewed - Yes, and within treatment parameters.   Weight within 10% of previous measurement - Yes Informed consent completed and reflects current therapy/intent - Yes, on date 10/28/2021             Provider progress note reviewed - Yes, today's provider note was reviewed. Treatment/Antibody/Supportive plan reviewed - Yes, and there are no adjustments needed for today's treatment. S&H and other orders reviewed - Yes, and there are no additional orders identified. Previous treatment date reviewed - Yes, and the appropriate amount of time has elapsed between treatments. Clinic Hand Off Received from - Cristy Friedlander, RN  Patient to proceed with treatment.

## 2021-12-23 NOTE — Progress Notes (Signed)
Patient seen by Dr. Benay Spice today  Vitals are within treatment parameters.No intervention for BP 145/90  Labs reviewed by Dr. Benay Spice and are within treatment parameters.  Per physician team, patient is ready for treatment and there are NO modifications to the treatment plan.

## 2021-12-23 NOTE — Patient Instructions (Signed)
[facility]  Discharge Instructions: Thank you for choosing [facility] to provide your oncology and hematology care.   If you have a lab appointment with the [facility], please go directly to the [facility] and check in at the registration area.   Wear comfortable clothing and clothing appropriate for easy access to any Portacath or PICC line.   We strive to give you quality time with your provider. You may need to reschedule your appointment if you arrive late (15 or more minutes).  Arriving late affects you and other patients whose appointments are after yours.  Also, if you miss three or more appointments without notifying the office, you may be dismissed from the clinic at the provider's discretion.      For prescription refill requests, have your pharmacy contact our office and allow 72 hours for refills to be completed.    Today you received the following chemotherapy and/or immunotherapy agents Avastin      To help prevent nausea and vomiting after your treatment, we encourage you to take your nausea medication as directed.  BELOW ARE SYMPTOMS THAT SHOULD BE REPORTED IMMEDIATELY: *FEVER GREATER THAN 100.4 F (38 C) OR HIGHER *CHILLS OR SWEATING *NAUSEA AND VOMITING THAT IS NOT CONTROLLED WITH YOUR NAUSEA MEDICATION *UNUSUAL SHORTNESS OF BREATH *UNUSUAL BRUISING OR BLEEDING *URINARY PROBLEMS (pain or burning when urinating, or frequent urination) *BOWEL PROBLEMS (unusual diarrhea, constipation, pain near the anus) TENDERNESS IN MOUTH AND THROAT WITH OR WITHOUT PRESENCE OF ULCERS (sore throat, sores in mouth, or a toothache) UNUSUAL RASH, SWELLING OR PAIN  UNUSUAL VAGINAL DISCHARGE OR ITCHING   Items with * indicate a potential emergency and should be followed up as soon as possible or go to the Emergency Department if any problems should occur.  Please show the CHEMOTHERAPY ALERT CARD or IMMUNOTHERAPY ALERT CARD at check-in to the  Emergency Department and triage nurse.  Should you have questions after your visit or need to cancel or reschedule your appointment, please contact [facility]  Dept: [phone]  and follow the prompts.  Office hours are 8:00 a.m. to 4:30 p.m. Monday - Friday. Please note that voicemails left after 4:00 p.m. may not be returned until the following business day.  We are closed weekends and major holidays. You have access to a nurse at all times for urgent questions. Please call the main number to the clinic Dept: [phone] and follow the prompts.   For any non-urgent questions, you may also contact your provider using MyChart. We now offer e-Visits for anyone 73 and older to request care online for non-urgent symptoms. For details visit [email or web address].   Also download the MyChart app! Go to the app store, search "MyChart", open the app, select [facility], and log in with your MyChart username and password.  Masks are optional in the cancer centers. If you would like for your care team to wear a mask while they are taking care of you, please let them know. You may have one support person who is at least 48 years old accompany you for your appointments.

## 2021-12-23 NOTE — Progress Notes (Signed)
[facility] OFFICE PROGRESS NOTE   Diagnosis: Colon cancer  INTERVAL HISTORY:   [name] returns as scheduled.  She began cycle 2 Lonsurf 12/09/2021.  No nausea or diarrhea.  She continues to have pain in the upper thigh/groin bilaterally with weightbearing.  She takes hydrocodone once daily.  No difficulty with bowel or bladder function.  No bleeding or symptom of thrombosis.  She fell last week while lifting a chair.  Objective:  Vital signs in last 24 hours:  Blood pressure (!) 145/90, pulse 86, temperature 98.2 F (36.8 C), temperature source Oral, resp. rate 18, height 5' 4" (1.626 m), weight 166 lb 9.6 oz (75.6 kg), last menstrual period 01/07/2006, SpO2 100 %.    HEENT: No thrush or ulcers Resp: Lungs clear bilaterally Cardio: Regular rate and rhythm GI: No hepatosplenomegaly, nontender, no mass Vascular: No leg edema Musculoskeletal: No pain with motion at the hips, no tenderness at the trochanter bilaterally  Portacath/PICC-without erythema  Lab Results:  Lab Results  Component Value Date   WBC 7.3 12/23/2021   HGB 13.1 12/23/2021   HCT 38.9 12/23/2021   MCV 95.3 12/23/2021   PLT 267 12/23/2021   NEUTROABS 2.7 12/23/2021    CMP  Lab Results  Component Value Date   NA 139 12/10/2021   K 3.0 (L) 12/10/2021   CL 100 12/10/2021   CO2 26 12/10/2021   GLUCOSE 85 12/10/2021   BUN 11 12/10/2021   CREATININE 0.72 12/10/2021   CALCIUM 9.6 12/10/2021   PROT 7.8 12/10/2021   ALBUMIN 4.6 12/10/2021   AST 18 12/10/2021   ALT 11 12/10/2021   ALKPHOS 100 12/10/2021   BILITOT 0.4 12/10/2021   GFRNONAA >60 12/10/2021   GFRAA >60 10/17/2019    Lab Results  Component Value Date   CEA1 8.66 (H) 07/02/2020   CEA 11.19 (H) 12/10/2021    Medications: I have reviewed the patient's current medications.   Assessment/Plan:  Sigmoid colon cancer, stage IV (pT4a,pN2b,M1c) Colonoscopy 03/24/2019-3 rectal polyps-hyperplastic polyps, distal colon  biopsy-at least intramucosal adenocarcinoma, completely obstructing mass in the distal sigmoid colon, could not be traversed, intact mismatch repair protein expression 03/24/2019-CEA 56.1 03/29/2019 CT abdomen/pelvis-circumferential thickening involving the entire mid and distal sigmoid colon to the level of the rectosigmoid junction, solid/cystic mass in the left ovary, bilateral nephrolithiasis Robotic assisted low anterior resection, mesenteric lymphadenectomy, bilateral salpingo-oophorectomy 04/08/2019 Pathology ([facility] review of outside pathology) metastatic adenocarcinoma involving the peritoneum overlying the round ligament, serosa of the urinary bladder, serosa of the right ureter a sacral area, pelvic peritoneum, and left ovary.  Omental biopsy with focal mucin pools with no carcinoma cells identified, right hemidiaphragm biopsy involved by metastatic adenocarcinoma, invasive adenocarcinoma the sigmoid colon, moderately differentiated, T4a, perineural and vascular invasion present, 7/22 lymph nodes, multiple tumor deposits, resection margins negative Negative for PD-L1, low probability of MSI-high, HER-2 negative, negative for BRAF, NRAS and KRAS alterations CTs 05/19/2019-no evidence of metastatic disease, findings suspicious for colitis of the transverse and ascending colon, small amount of ascites in the cul-de-sac, bilateral renal calculi Cycle 1 FOLFIRI 05/24/2019, bevacizumab added with cycle 2 Cycle 4 FOLFIRI/bevacizumab 07/05/2019 Cycle 5 FOLFIRI 07/27/2019, bevacizumab held secondary to hypertension Cycle 6 FOLFIRI 08/10/1999, bevacizumab held secondary to hypertension CTs 08/30/2019-no evidence of recurrent disease, new subsolid right lower lobe nodule felt to be inflammatory, emphysema Cycle 7 FOLFIRI 09/26/2019, bevacizumab remains on hold secondary to hypertension Cycle 8 FOLFIRI 10/17/2019, bevacizumab held secondary to hypertension, Udenyca added for neutropenia Cycle 9 FOLFIRI   11/07/2019,  bevacizumab held secondary to hypertension, Udenyca Cycle 10 FOLFIRI 11/28/2019, bevacizumab held, Udenyca Cycle 11 FOLFIRI 12/26/2019, bevacizumab held, Udenyca CTs 01/25/2020-no evidence of recurrent disease, resolution of right lower lobe nodule Maintenance Xeloda beginning 02/06/2020 CTs 04/25/2020- no evidence of metastatic disease, multiple bilateral renal calculi without hydronephrosis Maintenance Xeloda continued CT abdomen/pelvis without contrast 08/10/2020-bilateral staghorn renal calculi, no evidence of metastatic disease; addendum 08/23/2020-small but increasing omental nodules. Cycle 1 FOLFIRI/panitumumab 09/19/2020 Cycle 2 FOLFIRI/Panitumumab 10/03/2020 Cycle 3 FOLFIRI/Panitumumab 10/17/2020 Cycle 4 FOLFIRI/panitumumab 10/31/2020 Cycle 5 FOLFIRI/Panitumumab 11/14/2020 CTs 11/26/2020-stable omental metastases compared to 10/22/2020, mildly decreased from 08/10/2020.  Left lower quadrant tiny paracolic gutter implant slightly decreased from CT 08/10/2020.  No new or progressive metastatic disease in the abdomen or pelvis. Cycle 6 FOLFIRI/Panitumumab 11/28/2020, irinotecan dose reduced, treatment schedule adjusted to every 3 weeks going forward Cycle 7 FOLFIRI/panitumumab 12/24/2020 Cycle 8 FOLFIRI/Panitumumab 01/16/2021--treatment held due to hypertension in the infusion area. Cycle 8 FOLFIRI/panitumumab 01/21/2021 Cycle 9 FOLFIRI/panitumumab 02/11/2021 Cycle 10 FOLFIRI/Panitumumab 03/04/2021 Cycle 11 FOLFIRI/Panitumumab 03/25/2021 CT abdomen/pelvis 04/10/2021-stable right omental and left posterior paracolic gutter nodules Cycle 12 FOLFIRI/panitumumab 04/15/2021 Cycle 13 FOLFIRI/panitumumab 05/06/2021 Cycle 14 FOLFIRI/Panitumumab 05/27/2021 Cycle 15 FOLFIRI/Panitumumab 06/17/2021 CT abdomen/pelvis 07/05/2021-slight enlargement of a dominant right omental implant, no new implants Patient requested a treatment break CT abdomen/pelvis 10/04/2021-mild increase in size of multiple omental soft tissue  nodules, 4 mm calculus in the distal left ureter adjacent to the ureteral stent Cycle 1 Lonsurf 10/14/2021 10/28/2021 Avastin every 2 weeks Cycle 2 Lonsurf 11/11/2021 Cycle 3 Lonsurf 12/09/2021 Cycle 4 Lonsurf 01/06/2022   Hypertension G4 P3, twins Kidney stones-bilateral staghorn renal calculi on CT 08/10/2020 Infected Port-A-Cath 09/12/2019-placed on Augmentin, referred for Port-A-Cath removal; Port-A-Cath removed 09/14/2019; PICC line placed 09/14/2019; culture staph aureus.  Course of Septra completed. Neutropenia secondary to chemotherapy-Udenyca added with cycle 8 FOLFIRI Right nephrostomy tube 09/12/2020; stent placed 10/09/2020, percutaneous nephrostolithotomy treatment of right-sided kidney stones 10/09/2020, malpositioned right ureter stent replacement 10/23/2020, right ureter stent removed 10/29/2020 Percutaneous left nephrostolithotomy 10/01/2021-no renal stone identified, left ureter stent left in place Cystoscopy 10/14/2021-stent removed     Disposition: [name] appears stable.  She continues every 2-week bevacizumab.  She completed another cycle of Lonsurf beginning 12/09/2021.  She will complete cycle 4 Lonsurf beginning 01/06/2022. She will return for an office visit and Avastin on 01/08/2022.  We will check a CEA when she is here on 01/08/2022.  The plan is to schedule restaging CTs after cycle 4 Lonsurf.  Betsy Coder, MD  12/23/2021  9:40 AM

## 2021-12-25 ENCOUNTER — Inpatient Hospital Stay: Payer: Medicare Other | Admitting: Licensed Clinical Social Worker

## 2021-12-25 NOTE — Progress Notes (Signed)
[facility] CSW Progress Note  Clinical Education officer, museum contacted patient by phone to assess psychosocial needs.  Patient wanted to verify that CSW had her updated phone number, which was confirmed.  She stated she is enjoying her new home.  She said she needed a stove.  CSW referred her to [facility] to help furnish her home.  She expressed no other needs at this time.    Rodman Pickle Dravon Nott, LCSW    Patient is participating in a Managed Medicaid Plan:  Yes

## 2021-12-26 ENCOUNTER — Other Ambulatory Visit: Payer: Self-pay

## 2021-12-26 ENCOUNTER — Other Ambulatory Visit (HOSPITAL_COMMUNITY): Payer: Self-pay

## 2021-12-30 ENCOUNTER — Other Ambulatory Visit: Payer: Self-pay

## 2021-12-31 ENCOUNTER — Other Ambulatory Visit: Payer: Self-pay | Admitting: Nurse Practitioner

## 2021-12-31 ENCOUNTER — Telehealth: Payer: Self-pay

## 2021-12-31 ENCOUNTER — Encounter: Payer: Self-pay | Admitting: Oncology

## 2021-12-31 ENCOUNTER — Other Ambulatory Visit (HOSPITAL_COMMUNITY): Payer: Self-pay

## 2021-12-31 DIAGNOSIS — C20 Malignant neoplasm of rectum: Secondary | ICD-10-CM

## 2021-12-31 DIAGNOSIS — C786 Secondary malignant neoplasm of retroperitoneum and peritoneum: Secondary | ICD-10-CM

## 2021-12-31 MED ORDER — HYDROCODONE-ACETAMINOPHEN 10-325 MG PO TABS: 1.0000 | ORAL_TABLET | Freq: Four times a day (QID) | ORAL | 0 refills | Status: DC | PRN

## 2021-12-31 NOTE — Telephone Encounter (Signed)
Miss Carranco called and request a refill of her Norco, placed the request on  lisa's desk.

## 2022-01-01 ENCOUNTER — Other Ambulatory Visit (HOSPITAL_COMMUNITY): Payer: Self-pay

## 2022-01-08 ENCOUNTER — Encounter: Payer: Self-pay | Admitting: Nurse Practitioner

## 2022-01-08 ENCOUNTER — Telehealth: Payer: Self-pay

## 2022-01-08 ENCOUNTER — Inpatient Hospital Stay (HOSPITAL_BASED_OUTPATIENT_CLINIC_OR_DEPARTMENT_OTHER): Payer: Medicare Other | Admitting: Nurse Practitioner

## 2022-01-08 ENCOUNTER — Inpatient Hospital Stay: Payer: Medicare Other

## 2022-01-08 VITALS — BP 142/80 | HR 83 | Temp 98.1°F | Resp 18 | Ht 64.0 in | Wt 158.0 lb

## 2022-01-08 VITALS — BP 142/90

## 2022-01-08 DIAGNOSIS — C20 Malignant neoplasm of rectum: Secondary | ICD-10-CM

## 2022-01-08 DIAGNOSIS — Z5112 Encounter for antineoplastic immunotherapy: Secondary | ICD-10-CM | POA: Diagnosis not present

## 2022-01-08 DIAGNOSIS — Z79899 Other long term (current) drug therapy: Secondary | ICD-10-CM | POA: Diagnosis not present

## 2022-01-08 DIAGNOSIS — C786 Secondary malignant neoplasm of retroperitoneum and peritoneum: Secondary | ICD-10-CM

## 2022-01-08 DIAGNOSIS — C187 Malignant neoplasm of sigmoid colon: Secondary | ICD-10-CM | POA: Diagnosis not present

## 2022-01-08 DIAGNOSIS — C7952 Secondary malignant neoplasm of bone marrow: Secondary | ICD-10-CM | POA: Diagnosis not present

## 2022-01-08 DIAGNOSIS — C7951 Secondary malignant neoplasm of bone: Secondary | ICD-10-CM | POA: Diagnosis not present

## 2022-01-08 LAB — CMP (CANCER CENTER ONLY)
ALT: 6 U/L (ref 0–44)
AST: 14 U/L — ABNORMAL LOW (ref 15–41)
Albumin: 4.7 g/dL (ref 3.5–5.0)
Alkaline Phosphatase: 84 U/L (ref 38–126)
Anion gap: 12 (ref 5–15)
BUN: 9 mg/dL (ref 6–20)
CO2: 31 mmol/L (ref 22–32)
Calcium: 10.4 mg/dL — ABNORMAL HIGH (ref 8.9–10.3)
Chloride: 94 mmol/L — ABNORMAL LOW (ref 98–111)
Creatinine: 0.77 mg/dL (ref 0.44–1.00)
GFR, Estimated: >60 mL/min (ref >60)
Glucose, Bld: 135 mg/dL — ABNORMAL HIGH (ref 70–99)
Potassium: 2.7 mmol/L — CL (ref 3.5–5.1)
Sodium: 137 mmol/L (ref 135–145)
Total Bilirubin: 0.3 mg/dL (ref 0.3–1.2)
Total Protein: 8 g/dL (ref 6.5–8.1)

## 2022-01-08 LAB — CBC WITH DIFFERENTIAL (CANCER CENTER ONLY)
Abs Immature Granulocytes: 0.01 10*3/uL (ref 0.00–0.07)
Basophils Absolute: 0.1 10*3/uL (ref 0.0–0.1)
Basophils Relative: 1 %
Eosinophils Absolute: 1 10*3/uL — ABNORMAL HIGH (ref 0.0–0.5)
Eosinophils Relative: 16 %
HCT: 38.7 % (ref 36.0–46.0)
Hemoglobin: 13.5 g/dL (ref 12.0–15.0)
Immature Granulocytes: 0 %
Lymphocytes Relative: 36 %
Lymphs Abs: 2.1 10*3/uL (ref 0.7–4.0)
MCH: 32.7 pg (ref 26.0–34.0)
MCHC: 34.9 g/dL (ref 30.0–36.0)
MCV: 93.7 fL (ref 80.0–100.0)
Monocytes Absolute: 0.5 10*3/uL (ref 0.1–1.0)
Monocytes Relative: 9 %
Neutro Abs: 2.3 10*3/uL (ref 1.7–7.7)
Neutrophils Relative %: 38 %
Platelet Count: 278 10*3/uL (ref 150–400)
RBC: 4.13 MIL/uL (ref 3.87–5.11)
RDW: 14.9 % (ref 11.5–15.5)
WBC Count: 5.9 10*3/uL (ref 4.0–10.5)
nRBC: 0 % (ref 0.0–0.2)

## 2022-01-08 LAB — TOTAL PROTEIN, URINE DIPSTICK: Protein, ur: 30 mg/dL — AB

## 2022-01-08 LAB — MAGNESIUM: Magnesium: 1.9 mg/dL (ref 1.7–2.4)

## 2022-01-08 LAB — CEA (ACCESS): CEA (CHCC): 10.49 ng/mL — ABNORMAL HIGH (ref 0.00–5.00)

## 2022-01-08 MED ORDER — SODIUM CHLORIDE 0.9 % IV SOLN
5.0000 mg/kg | Freq: Once | INTRAVENOUS | Status: AC
  Administered 2022-01-08: 400 mg via INTRAVENOUS
  Filled 2022-01-08: qty 16

## 2022-01-08 MED ORDER — POTASSIUM CHLORIDE 20 MEQ/15ML (10%) PO SOLN: 20.0000 meq | Freq: Two times a day (BID) | ORAL | 0 refills | Status: DC

## 2022-01-08 MED ORDER — SODIUM CHLORIDE 0.9 % IV SOLN: Freq: Once | INTRAVENOUS | Status: AC

## 2022-01-08 NOTE — Telephone Encounter (Signed)
CRITICAL VALUE STICKER  CRITICAL VALUE: potassium 2.7  RECEIVER (on-site recipient of call): Jerene Canny  DATE & TIME NOTIFIED: 01/08/22 10:19  MESSENGER (representative from lab):Sunni G  MD NOTIFIED:  Elby Showers. Thomas TIME OF NOTIFICATION: 01/08/22 10:20 RESPONSE:  Patient is here for tx, the issue will be address.

## 2022-01-08 NOTE — Progress Notes (Signed)
Ok to keep 400 mg dose for today per MD

## 2022-01-08 NOTE — Telephone Encounter (Signed)
CRITICAL VALUE STICKER  CRITICAL VALUE: K 2.7  RECEIVER (on-site recipient of call):Chris  DATE & TIME NOTIFIED: 01/08/22 1020   MESSENGER (representative from lab):  MD NOTIFIED:  Ned Card NP  TIME OF NOTIFICATION: 2072  RESPONSE:  oral K prescribed

## 2022-01-08 NOTE — Patient Instructions (Signed)
[facility]  Discharge Instructions: Thank you for choosing [facility] to provide your oncology and hematology care.   If you have a lab appointment with the [facility], please go directly to the [facility] and check in at the registration area.   Wear comfortable clothing and clothing appropriate for easy access to any Portacath or PICC line.   We strive to give you quality time with your provider. You may need to reschedule your appointment if you arrive late (15 or more minutes).  Arriving late affects you and other patients whose appointments are after yours.  Also, if you miss three or more appointments without notifying the office, you may be dismissed from the clinic at the provider's discretion.      For prescription refill requests, have your pharmacy contact our office and allow 72 hours for refills to be completed.    Today you received the following chemotherapy and/or immunotherapy agents Avastin      To help prevent nausea and vomiting after your treatment, we encourage you to take your nausea medication as directed.  BELOW ARE SYMPTOMS THAT SHOULD BE REPORTED IMMEDIATELY: *FEVER GREATER THAN 100.4 F (38 C) OR HIGHER *CHILLS OR SWEATING *NAUSEA AND VOMITING THAT IS NOT CONTROLLED WITH YOUR NAUSEA MEDICATION *UNUSUAL SHORTNESS OF BREATH *UNUSUAL BRUISING OR BLEEDING *URINARY PROBLEMS (pain or burning when urinating, or frequent urination) *BOWEL PROBLEMS (unusual diarrhea, constipation, pain near the anus) TENDERNESS IN MOUTH AND THROAT WITH OR WITHOUT PRESENCE OF ULCERS (sore throat, sores in mouth, or a toothache) UNUSUAL RASH, SWELLING OR PAIN  UNUSUAL VAGINAL DISCHARGE OR ITCHING   Items with * indicate a potential emergency and should be followed up as soon as possible or go to the Emergency Department if any problems should occur.  Please show the CHEMOTHERAPY ALERT CARD or IMMUNOTHERAPY ALERT CARD at check-in to the  Emergency Department and triage nurse.  Should you have questions after your visit or need to cancel or reschedule your appointment, please contact [facility]  Dept: [phone]  and follow the prompts.  Office hours are 8:00 a.m. to 4:30 p.m. Monday - Friday. Please note that voicemails left after 4:00 p.m. may not be returned until the following business day.  We are closed weekends and major holidays. You have access to a nurse at all times for urgent questions. Please call the main number to the clinic Dept: [phone] and follow the prompts.   For any non-urgent questions, you may also contact your provider using MyChart. We now offer e-Visits for anyone 58 and older to request care online for non-urgent symptoms. For details visit [email or web address].   Also download the MyChart app! Go to the app store, search "MyChart", open the app, select [facility], and log in with your MyChart username and password.  Masks are optional in the cancer centers. If you would like for your care team to wear a mask while they are taking care of you, please let them know. You may have one support person who is at least 48 years old accompany you for your appointments.

## 2022-01-08 NOTE — Progress Notes (Signed)
Patient seen by Ned Card NP today  Vitals are within treatment parameters.  Labs reviewed by Ned Card NP and are not all within treatment parameters. K 2.7 oral potassium ordered  Lab is adding Mg to her draw from today  Per physician team, patient is ready for treatment and there are NO modifications to the treatment plan.

## 2022-01-08 NOTE — Progress Notes (Addendum)
[facility] OFFICE PROGRESS NOTE   Diagnosis: Colon cancer  INTERVAL HISTORY:   April [name] returns as scheduled.  She began cycle 4 Lonsurf 01/06/2022.  She continues every 2-week Avastin.  Overall feels well.  No nausea or vomiting.  No mouth sores.  Periodic loose stools.  No bleeding.  No rash.  She has a good appetite.  She has not been taking potassium as prescribed.  Objective:  Vital signs in last 24 hours:  Blood pressure (!) 142/80, pulse 83, temperature 98.1 F (36.7 C), temperature source Oral, resp. rate 18, height _0  (1.626 m), weight 158 lb (71.7 kg), last menstrual period 01/07/2006, SpO2 100 %.    HEENT: No thrush or ulcers. Resp: Lungs clear bilaterally. Cardio: Regular rate and rhythm. GI: Abdomen soft and nontender.  No hepatosplenomegaly. Vascular: No leg edema. Neuro: Alert and oriented. Skin: No rash.   Lab Results:  Lab Results  Component Value Date   WBC 5.9 01/08/2022   HGB 13.5 01/08/2022   HCT 38.7 01/08/2022   MCV 93.7 01/08/2022   PLT 278 01/08/2022   NEUTROABS 2.3 01/08/2022    Imaging:  No results found.  Medications: I have reviewed the patient's current medications.  Assessment/Plan: Sigmoid colon cancer, stage IV (pT4a,pN2b,M1c) Colonoscopy 03/24/2019-3 rectal polyps-hyperplastic polyps, distal colon biopsy-at least intramucosal adenocarcinoma, completely obstructing mass in the distal sigmoid colon, could not be traversed, intact mismatch repair protein expression 03/24/2019-CEA 56.1 03/29/2019 CT abdomen/pelvis-circumferential thickening involving the entire mid and distal sigmoid colon to the level of the rectosigmoid junction, solid/cystic mass in the left ovary, bilateral nephrolithiasis Robotic assisted low anterior resection, mesenteric lymphadenectomy, bilateral salpingo-oophorectomy 04/08/2019 Pathology ([facility] review of outside pathology) metastatic adenocarcinoma involving the peritoneum overlying the  round ligament, serosa of the urinary bladder, serosa of the right ureter a sacral area, pelvic peritoneum, and left ovary.  Omental biopsy with focal mucin pools with no carcinoma cells identified, right hemidiaphragm biopsy involved by metastatic adenocarcinoma, invasive adenocarcinoma the sigmoid colon, moderately differentiated, T4a, perineural and vascular invasion present, 7/22 lymph nodes, multiple tumor deposits, resection margins negative Negative for PD-L1, low probability of MSI-high, HER-2 negative, negative for BRAF, NRAS and KRAS alterations CTs 05/19/2019-no evidence of metastatic disease, findings suspicious for colitis of the transverse and ascending colon, small amount of ascites in the cul-de-sac, bilateral renal calculi Cycle 1 FOLFIRI 05/24/2019, bevacizumab added with cycle 2 Cycle 4 FOLFIRI/bevacizumab 07/05/2019 Cycle 5 FOLFIRI 07/27/2019, bevacizumab held secondary to hypertension Cycle 6 FOLFIRI 08/10/1999, bevacizumab held secondary to hypertension CTs 08/30/2019-no evidence of recurrent disease, new subsolid right lower lobe nodule felt to be inflammatory, emphysema Cycle 7 FOLFIRI 09/26/2019, bevacizumab remains on hold secondary to hypertension Cycle 8 FOLFIRI 10/17/2019, bevacizumab held secondary to hypertension, Udenyca added for neutropenia Cycle 9 FOLFIRI 11/07/2019, bevacizumab held secondary to hypertension, Udenyca Cycle 10 FOLFIRI 11/28/2019, bevacizumab held, Udenyca Cycle 11 FOLFIRI 12/26/2019, bevacizumab held, Udenyca CTs 01/25/2020-no evidence of recurrent disease, resolution of right lower lobe nodule Maintenance Xeloda beginning 02/06/2020 CTs 04/25/2020- no evidence of metastatic disease, multiple bilateral renal calculi without hydronephrosis Maintenance Xeloda continued CT abdomen/pelvis without contrast 08/10/2020-bilateral staghorn renal calculi, no evidence of metastatic disease; addendum 08/23/2020-small but increasing omental nodules. Cycle 1  FOLFIRI/panitumumab 09/19/2020 Cycle 2 FOLFIRI/Panitumumab 10/03/2020 Cycle 3 FOLFIRI/Panitumumab 10/17/2020 Cycle 4 FOLFIRI/panitumumab 10/31/2020 Cycle 5 FOLFIRI/Panitumumab 11/14/2020 CTs 11/26/2020-stable omental metastases compared to 10/22/2020, mildly decreased from 08/10/2020.  Left lower quadrant tiny paracolic gutter implant slightly decreased from CT 08/10/2020.  No new or progressive  metastatic disease in the abdomen or pelvis. Cycle 6 FOLFIRI/Panitumumab 11/28/2020, irinotecan dose reduced, treatment schedule adjusted to every 3 weeks going forward Cycle 7 FOLFIRI/panitumumab 12/24/2020 Cycle 8 FOLFIRI/Panitumumab 01/16/2021--treatment held due to hypertension in the infusion area. Cycle 8 FOLFIRI/panitumumab 01/21/2021 Cycle 9 FOLFIRI/panitumumab 02/11/2021 Cycle 10 FOLFIRI/Panitumumab 03/04/2021 Cycle 11 FOLFIRI/Panitumumab 03/25/2021 CT abdomen/pelvis 04/10/2021-stable right omental and left posterior paracolic gutter nodules Cycle 12 FOLFIRI/panitumumab 04/15/2021 Cycle 13 FOLFIRI/panitumumab 05/06/2021 Cycle 14 FOLFIRI/Panitumumab 05/27/2021 Cycle 15 FOLFIRI/Panitumumab 06/17/2021 CT abdomen/pelvis 07/05/2021-slight enlargement of a dominant right omental implant, no new implants Patient requested a treatment break CT abdomen/pelvis 10/04/2021-mild increase in size of multiple omental soft tissue nodules, 4 mm calculus in the distal left ureter adjacent to the ureteral stent Cycle 1 Lonsurf 10/14/2021 10/28/2021 Avastin every 2 weeks Cycle 2 Lonsurf 11/11/2021 Cycle 3 Lonsurf 12/09/2021 Cycle 4 Lonsurf 01/06/2022   Hypertension G4 P3, twins Kidney stones-bilateral staghorn renal calculi on CT 08/10/2020 Infected Port-A-Cath 09/12/2019-placed on Augmentin, referred for Port-A-Cath removal; Port-A-Cath removed 09/14/2019; PICC line placed 09/14/2019; culture staph aureus.  Course of Septra completed. Neutropenia secondary to chemotherapy-Udenyca added with cycle 8 FOLFIRI Right nephrostomy tube  09/12/2020; stent placed 10/09/2020, percutaneous nephrostolithotomy treatment of right-sided kidney stones 10/09/2020, malpositioned right ureter stent replacement 10/23/2020, right ureter stent removed 10/29/2020 Percutaneous left nephrostolithotomy 10/01/2021-no renal stone identified, left ureter stent left in place Cystoscopy 10/14/2021-stent removed    Disposition: April [name] appears stable.  She began cycle 4 Lonsurf on 01/06/2022.  Plan to continue the same.  She receives Avastin every 2 weeks, scheduled today.  Plan for restaging CTs in about 3 weeks.  CBC and chemistry panel reviewed.  Labs adequate to proceed as above.  She has hypokalemia.  She has not been taking potassium as prescribed, citing the size of the pills.  She would like to try a liquid formation.  New prescription sent to her pharmacy.  We are checking a magnesium level.  She will return for a follow-up basic metabolic panel on 02/22/9240.  CEA is stable.  She will return for lab, follow-up, Avastin in 2 weeks.  She will contact the office in the interim with any problems.    Ned Card ANP/GNP-BC   01/08/2022  10:40 AM

## 2022-01-09 ENCOUNTER — Other Ambulatory Visit: Payer: Self-pay

## 2022-01-11 ENCOUNTER — Other Ambulatory Visit: Payer: Self-pay

## 2022-01-13 ENCOUNTER — Encounter: Payer: Self-pay | Admitting: Oncology

## 2022-01-14 ENCOUNTER — Inpatient Hospital Stay: Payer: 59 | Attending: Oncology

## 2022-01-14 DIAGNOSIS — C187 Malignant neoplasm of sigmoid colon: Secondary | ICD-10-CM | POA: Insufficient documentation

## 2022-01-14 DIAGNOSIS — Z5112 Encounter for antineoplastic immunotherapy: Secondary | ICD-10-CM | POA: Insufficient documentation

## 2022-01-14 DIAGNOSIS — Z79899 Other long term (current) drug therapy: Secondary | ICD-10-CM | POA: Insufficient documentation

## 2022-01-14 DIAGNOSIS — I10 Essential (primary) hypertension: Secondary | ICD-10-CM | POA: Insufficient documentation

## 2022-01-14 DIAGNOSIS — E876 Hypokalemia: Secondary | ICD-10-CM | POA: Insufficient documentation

## 2022-01-17 ENCOUNTER — Other Ambulatory Visit (HOSPITAL_COMMUNITY): Payer: Self-pay

## 2022-01-20 ENCOUNTER — Ambulatory Visit (HOSPITAL_BASED_OUTPATIENT_CLINIC_OR_DEPARTMENT_OTHER): Payer: Medicare Other | Admitting: Nurse Practitioner

## 2022-01-20 ENCOUNTER — Inpatient Hospital Stay: Payer: 59

## 2022-01-20 ENCOUNTER — Encounter: Payer: Self-pay | Admitting: Oncology

## 2022-01-20 ENCOUNTER — Inpatient Hospital Stay (HOSPITAL_BASED_OUTPATIENT_CLINIC_OR_DEPARTMENT_OTHER): Payer: 59 | Admitting: Nurse Practitioner

## 2022-01-20 ENCOUNTER — Encounter: Payer: Self-pay | Admitting: Nurse Practitioner

## 2022-01-20 VITALS — BP 138/80 | HR 98 | Temp 98.1°F | Resp 18 | Ht 64.0 in | Wt 154.8 lb

## 2022-01-20 DIAGNOSIS — R809 Proteinuria, unspecified: Secondary | ICD-10-CM

## 2022-01-20 DIAGNOSIS — Z5112 Encounter for antineoplastic immunotherapy: Secondary | ICD-10-CM | POA: Diagnosis not present

## 2022-01-20 DIAGNOSIS — C20 Malignant neoplasm of rectum: Secondary | ICD-10-CM | POA: Diagnosis not present

## 2022-01-20 DIAGNOSIS — C786 Secondary malignant neoplasm of retroperitoneum and peritoneum: Secondary | ICD-10-CM

## 2022-01-20 DIAGNOSIS — Z79899 Other long term (current) drug therapy: Secondary | ICD-10-CM | POA: Diagnosis not present

## 2022-01-20 DIAGNOSIS — E876 Hypokalemia: Secondary | ICD-10-CM | POA: Diagnosis not present

## 2022-01-20 DIAGNOSIS — C187 Malignant neoplasm of sigmoid colon: Secondary | ICD-10-CM | POA: Diagnosis not present

## 2022-01-20 DIAGNOSIS — I10 Essential (primary) hypertension: Secondary | ICD-10-CM | POA: Diagnosis not present

## 2022-01-20 LAB — URINALYSIS, COMPLETE (UACMP) WITH MICROSCOPIC
Bilirubin Urine: NEGATIVE
Glucose, UA: NEGATIVE mg/dL
Ketones, ur: 40 mg/dL — AB
Leukocytes,Ua: NEGATIVE
Nitrite: NEGATIVE
Protein, ur: 100 mg/dL — AB
Specific Gravity, Urine: 1.029 (ref 1.005–1.030)
pH: 5.5 (ref 5.0–8.0)

## 2022-01-20 LAB — CBC WITH DIFFERENTIAL (CANCER CENTER ONLY)
Abs Immature Granulocytes: 0.01 10*3/uL (ref 0.00–0.07)
Basophils Absolute: 0.1 10*3/uL (ref 0.0–0.1)
Basophils Relative: 1 %
Eosinophils Absolute: 0.5 10*3/uL (ref 0.0–0.5)
Eosinophils Relative: 7 %
HCT: 45.8 % (ref 36.0–46.0)
Hemoglobin: 15.6 g/dL — ABNORMAL HIGH (ref 12.0–15.0)
Immature Granulocytes: 0 %
Lymphocytes Relative: 41 %
Lymphs Abs: 3 10*3/uL (ref 0.7–4.0)
MCH: 33 pg (ref 26.0–34.0)
MCHC: 34.1 g/dL (ref 30.0–36.0)
MCV: 96.8 fL (ref 80.0–100.0)
Monocytes Absolute: 0.5 10*3/uL (ref 0.1–1.0)
Monocytes Relative: 7 %
Neutro Abs: 3.3 10*3/uL (ref 1.7–7.7)
Neutrophils Relative %: 44 %
Platelet Count: 288 10*3/uL (ref 150–400)
RBC: 4.73 MIL/uL (ref 3.87–5.11)
RDW: 15.3 % (ref 11.5–15.5)
WBC Count: 7.4 10*3/uL (ref 4.0–10.5)
nRBC: 0 % (ref 0.0–0.2)

## 2022-01-20 LAB — CMP (CANCER CENTER ONLY)
ALT: 9 U/L (ref 0–44)
AST: 15 U/L (ref 15–41)
Albumin: 5 g/dL (ref 3.5–5.0)
Alkaline Phosphatase: 103 U/L (ref 38–126)
Anion gap: 16 — ABNORMAL HIGH (ref 5–15)
BUN: 17 mg/dL (ref 6–20)
CO2: 25 mmol/L (ref 22–32)
Calcium: 10.3 mg/dL (ref 8.9–10.3)
Chloride: 98 mmol/L (ref 98–111)
Creatinine: 0.68 mg/dL (ref 0.44–1.00)
GFR, Estimated: >60 mL/min (ref >60)
Glucose, Bld: 85 mg/dL (ref 70–99)
Potassium: 3.2 mmol/L — ABNORMAL LOW (ref 3.5–5.1)
Sodium: 139 mmol/L (ref 135–145)
Total Bilirubin: 0.6 mg/dL (ref 0.3–1.2)
Total Protein: 8.7 g/dL — ABNORMAL HIGH (ref 6.5–8.1)

## 2022-01-20 LAB — CEA (ACCESS): CEA (CHCC): 11.58 ng/mL — ABNORMAL HIGH (ref 0.00–5.00)

## 2022-01-20 LAB — TOTAL PROTEIN, URINE DIPSTICK: Protein, ur: 100 mg/dL — AB

## 2022-01-20 NOTE — Progress Notes (Signed)
April [name] OFFICE PROGRESS NOTE   Diagnosis: Colon cancer  INTERVAL HISTORY:   April [name] returns as scheduled.  She completed cycle 4 Lonsurf beginning 01/06/2022.  She denies nausea/vomiting.  She had a loose stool this morning.  She is concerned she has C. difficile colitis due to the appearance of the stool.  For the past 24 hours or so she has felt "hot and cold".  She has not checked her temperature.  No shaking chills.  No cough or shortness of breath.  No urinary frequency, urgency, dysuria.  No back pain.  Objective:  Vital signs in last 24 hours:  Blood pressure 138/80, pulse 98, temperature 98.1 F (36.7 C), temperature source Oral, resp. rate 18, height '5\' 4"'$  (1.626 m), weight 154 lb 12.8 oz (70.2 kg), last menstrual period 01/07/2006, SpO2 100 %.   Well-appearing, no acute distress. HEENT: No thrush or ulcers. Resp: Lungs clear bilaterally. Cardio: Regular rate and rhythm. GI: Abdomen soft.  Discomfort over the low abdomen.  No hepatosplenomegaly. Vascular: No leg edema. Neuro: Alert and oriented. Skin: Diaphoretic at the beginning of the examination, skin now warm and dry.   Lab Results:  Lab Results  Component Value Date   WBC 7.4 01/20/2022   HGB 15.6 (H) 01/20/2022   HCT 45.8 01/20/2022   MCV 96.8 01/20/2022   PLT 288 01/20/2022   NEUTROABS 3.3 01/20/2022    Imaging:  No results found.  Medications: I have reviewed the patient's current medications.  Assessment/Plan: Sigmoid colon cancer, stage IV (pT4a,pN2b,M1c) Colonoscopy 03/24/2019-3 rectal polyps-hyperplastic polyps, distal colon biopsy-at least intramucosal adenocarcinoma, completely obstructing mass in the distal sigmoid colon, could not be traversed, intact mismatch repair protein expression 03/24/2019-CEA 56.1 03/29/2019 CT abdomen/pelvis-circumferential thickening involving the entire mid and distal sigmoid colon to the level of the rectosigmoid junction, solid/cystic mass  in the left ovary, bilateral nephrolithiasis Robotic assisted low anterior resection, mesenteric lymphadenectomy, bilateral salpingo-oophorectomy 04/08/2019 Pathology ([facility] review of outside pathology) metastatic adenocarcinoma involving the peritoneum overlying the round ligament, serosa of the urinary bladder, serosa of the right ureter a sacral area, pelvic peritoneum, and left ovary.  Omental biopsy with focal mucin pools with no carcinoma cells identified, right hemidiaphragm biopsy involved by metastatic adenocarcinoma, invasive adenocarcinoma the sigmoid colon, moderately differentiated, T4a, perineural and vascular invasion present, 7/22 lymph nodes, multiple tumor deposits, resection margins negative Negative for PD-L1, low probability of MSI-high, HER-2 negative, negative for BRAF, NRAS and KRAS alterations CTs 05/19/2019-no evidence of metastatic disease, findings suspicious for colitis of the transverse and ascending colon, small amount of ascites in the cul-de-sac, bilateral renal calculi Cycle 1 FOLFIRI 05/24/2019, bevacizumab added with cycle 2 Cycle 4 FOLFIRI/bevacizumab 07/05/2019 Cycle 5 FOLFIRI 07/27/2019, bevacizumab held secondary to hypertension Cycle 6 FOLFIRI 08/10/1999, bevacizumab held secondary to hypertension CTs 08/30/2019-no evidence of recurrent disease, new subsolid right lower lobe nodule felt to be inflammatory, emphysema Cycle 7 FOLFIRI 09/26/2019, bevacizumab remains on hold secondary to hypertension Cycle 8 FOLFIRI 10/17/2019, bevacizumab held secondary to hypertension, Udenyca added for neutropenia Cycle 9 FOLFIRI 11/07/2019, bevacizumab held secondary to hypertension, Udenyca Cycle 10 FOLFIRI 11/28/2019, bevacizumab held, Udenyca Cycle 11 FOLFIRI 12/26/2019, bevacizumab held, Udenyca CTs 01/25/2020-no evidence of recurrent disease, resolution of right lower lobe nodule Maintenance Xeloda beginning 02/06/2020 CTs 04/25/2020- no evidence of metastatic disease, multiple  bilateral renal calculi without hydronephrosis Maintenance Xeloda continued CT abdomen/pelvis without contrast 08/10/2020-bilateral staghorn renal calculi, no evidence of metastatic disease; addendum 08/23/2020-small but increasing omental nodules. Cycle 1 FOLFIRI/panitumumab  09/19/2020 Cycle 2 FOLFIRI/Panitumumab 10/03/2020 Cycle 3 FOLFIRI/Panitumumab 10/17/2020 Cycle 4 FOLFIRI/panitumumab 10/31/2020 Cycle 5 FOLFIRI/Panitumumab 11/14/2020 CTs 11/26/2020-stable omental metastases compared to 10/22/2020, mildly decreased from 08/10/2020.  Left lower quadrant tiny paracolic gutter implant slightly decreased from CT 08/10/2020.  No new or progressive metastatic disease in the abdomen or pelvis. Cycle 6 FOLFIRI/Panitumumab 11/28/2020, irinotecan dose reduced, treatment schedule adjusted to every 3 weeks going forward Cycle 7 FOLFIRI/panitumumab 12/24/2020 Cycle 8 FOLFIRI/Panitumumab 01/16/2021--treatment held due to hypertension in the infusion area. Cycle 8 FOLFIRI/panitumumab 01/21/2021 Cycle 9 FOLFIRI/panitumumab 02/11/2021 Cycle 10 FOLFIRI/Panitumumab 03/04/2021 Cycle 11 FOLFIRI/Panitumumab 03/25/2021 CT abdomen/pelvis 04/10/2021-stable right omental and left posterior paracolic gutter nodules Cycle 12 FOLFIRI/panitumumab 04/15/2021 Cycle 13 FOLFIRI/panitumumab 05/06/2021 Cycle 14 FOLFIRI/Panitumumab 05/27/2021 Cycle 15 FOLFIRI/Panitumumab 06/17/2021 CT abdomen/pelvis 07/05/2021-slight enlargement of a dominant right omental implant, no new implants Patient requested a treatment break CT abdomen/pelvis 10/04/2021-mild increase in size of multiple omental soft tissue nodules, 4 mm calculus in the distal left ureter adjacent to the ureteral stent Cycle 1 Lonsurf 10/14/2021 10/28/2021 Avastin every 2 weeks Cycle 2 Lonsurf 11/11/2021 Cycle 3 Lonsurf 12/09/2021 Cycle 4 Lonsurf 01/06/2022 Avastin held 01/20/2022 due to proteinuria, 24-hour urine pending   Hypertension G4 P3, twins Kidney stones-bilateral staghorn  renal calculi on CT 08/10/2020 Infected Port-A-Cath 09/12/2019-placed on Augmentin, referred for Port-A-Cath removal; Port-A-Cath removed 09/14/2019; PICC line placed 09/14/2019; culture staph aureus.  Course of Septra completed. Neutropenia secondary to chemotherapy-Udenyca added with cycle 8 FOLFIRI Right nephrostomy tube 09/12/2020; stent placed 10/09/2020, percutaneous nephrostolithotomy treatment of right-sided kidney stones 10/09/2020, malpositioned right ureter stent replacement 10/23/2020, right ureter stent removed 10/29/2020 Percutaneous left nephrostolithotomy 10/01/2021-no renal stone identified, left ureter stent left in place Cystoscopy 10/14/2021-stent removed  Disposition: Ms. Seedorf appears stable.  She has completed 4 cycles of Lonsurf.  She is on Avastin every 2 weeks.  She presents today for routine follow-up, reports approximate 24-hour history of intermittent hot and cold episodes with no associated fever.  No localizing symptoms to suggest an infection.  She appears well.  CBC reviewed, white count/ANC in normal range.  Chemistry panel reviewed, mild hypokalemia, otherwise looks good.  Urine dipstick with 2+ protein, confirmed on urinalysis.  Bacteria present, no increase in white blood cells.  Avastin being held.  She will complete a 24-hour urine.  Urine culture is pending.  She will check her temperature if she has further episodes of feeling hot/cold.  She understands to contact the office with temperature greater than equal to 100.5, shaking chills, other concerning symptoms.  She is scheduled for restaging CT scans 01/30/2022.  Next office visit 02/03/2022.  We are available to see her sooner if needed.  She will contact the office tomorrow morning with an update on her condition.    Ned Card ANP/GNP-BC   01/20/2022  11:05 AM

## 2022-01-21 LAB — URINE CULTURE

## 2022-01-22 ENCOUNTER — Other Ambulatory Visit: Payer: Self-pay

## 2022-01-23 ENCOUNTER — Other Ambulatory Visit (HOSPITAL_COMMUNITY): Payer: Self-pay

## 2022-01-27 ENCOUNTER — Other Ambulatory Visit (HOSPITAL_BASED_OUTPATIENT_CLINIC_OR_DEPARTMENT_OTHER): Payer: Self-pay

## 2022-01-27 ENCOUNTER — Other Ambulatory Visit (HOSPITAL_COMMUNITY): Payer: Self-pay

## 2022-01-27 DIAGNOSIS — E876 Hypokalemia: Secondary | ICD-10-CM | POA: Diagnosis not present

## 2022-01-27 DIAGNOSIS — C20 Malignant neoplasm of rectum: Secondary | ICD-10-CM

## 2022-01-27 DIAGNOSIS — Z79899 Other long term (current) drug therapy: Secondary | ICD-10-CM | POA: Diagnosis not present

## 2022-01-27 DIAGNOSIS — C187 Malignant neoplasm of sigmoid colon: Secondary | ICD-10-CM | POA: Diagnosis not present

## 2022-01-27 DIAGNOSIS — I10 Essential (primary) hypertension: Secondary | ICD-10-CM | POA: Diagnosis not present

## 2022-01-27 DIAGNOSIS — Z5112 Encounter for antineoplastic immunotherapy: Secondary | ICD-10-CM | POA: Diagnosis not present

## 2022-01-27 DIAGNOSIS — R809 Proteinuria, unspecified: Secondary | ICD-10-CM

## 2022-01-27 DIAGNOSIS — C786 Secondary malignant neoplasm of retroperitoneum and peritoneum: Secondary | ICD-10-CM

## 2022-01-27 LAB — PROTEIN, URINE, 24 HOUR
Collection Interval-UPROT: 24 hours
Protein, 24H Urine: 43 mg/d — ABNORMAL LOW (ref 50–100)
Protein, Urine: 17 mg/dL
Urine Total Volume-UPROT: 250 mL

## 2022-01-29 ENCOUNTER — Other Ambulatory Visit (HOSPITAL_COMMUNITY): Payer: Self-pay

## 2022-01-30 ENCOUNTER — Ambulatory Visit (HOSPITAL_BASED_OUTPATIENT_CLINIC_OR_DEPARTMENT_OTHER)
Admission: RE | Admit: 2022-01-30 | Discharge: 2022-01-30 | Disposition: A | Discharge status: Home / Self Care | Payer: 59 | Source: Ambulatory Visit | Attending: Nurse Practitioner | Admitting: Nurse Practitioner

## 2022-01-30 ENCOUNTER — Other Ambulatory Visit: Payer: Self-pay | Admitting: Nurse Practitioner

## 2022-01-30 ENCOUNTER — Encounter (HOSPITAL_BASED_OUTPATIENT_CLINIC_OR_DEPARTMENT_OTHER): Payer: Self-pay

## 2022-01-30 DIAGNOSIS — C786 Secondary malignant neoplasm of retroperitoneum and peritoneum: Secondary | ICD-10-CM

## 2022-01-30 DIAGNOSIS — N281 Cyst of kidney, acquired: Secondary | ICD-10-CM | POA: Diagnosis not present

## 2022-01-30 DIAGNOSIS — C20 Malignant neoplasm of rectum: Secondary | ICD-10-CM

## 2022-01-30 DIAGNOSIS — C189 Malignant neoplasm of colon, unspecified: Secondary | ICD-10-CM | POA: Diagnosis not present

## 2022-01-30 DIAGNOSIS — N200 Calculus of kidney: Secondary | ICD-10-CM | POA: Diagnosis not present

## 2022-01-30 MED ORDER — IOHEXOL 300 MG/ML  SOLN: 100.0000 mL | Freq: Once | INTRAMUSCULAR | Status: DC | PRN

## 2022-01-31 ENCOUNTER — Other Ambulatory Visit: Payer: Self-pay

## 2022-02-03 ENCOUNTER — Other Ambulatory Visit (HOSPITAL_COMMUNITY): Payer: Self-pay

## 2022-02-03 ENCOUNTER — Inpatient Hospital Stay (HOSPITAL_BASED_OUTPATIENT_CLINIC_OR_DEPARTMENT_OTHER): Payer: 59 | Admitting: Oncology

## 2022-02-03 ENCOUNTER — Inpatient Hospital Stay: Payer: 59

## 2022-02-03 ENCOUNTER — Other Ambulatory Visit: Payer: Self-pay | Admitting: *Deleted

## 2022-02-03 ENCOUNTER — Telehealth: Payer: Self-pay | Admitting: *Deleted

## 2022-02-03 ENCOUNTER — Other Ambulatory Visit (HOSPITAL_BASED_OUTPATIENT_CLINIC_OR_DEPARTMENT_OTHER): Payer: Self-pay

## 2022-02-03 ENCOUNTER — Other Ambulatory Visit: Payer: Self-pay | Admitting: Nurse Practitioner

## 2022-02-03 VITALS — BP 150/80 | HR 79 | Temp 98.1°F | Resp 18 | Ht 64.0 in | Wt 158.0 lb

## 2022-02-03 VITALS — BP 156/98

## 2022-02-03 DIAGNOSIS — C786 Secondary malignant neoplasm of retroperitoneum and peritoneum: Secondary | ICD-10-CM

## 2022-02-03 DIAGNOSIS — I10 Essential (primary) hypertension: Secondary | ICD-10-CM

## 2022-02-03 DIAGNOSIS — C20 Malignant neoplasm of rectum: Secondary | ICD-10-CM

## 2022-02-03 DIAGNOSIS — Z79899 Other long term (current) drug therapy: Secondary | ICD-10-CM | POA: Diagnosis not present

## 2022-02-03 DIAGNOSIS — C187 Malignant neoplasm of sigmoid colon: Secondary | ICD-10-CM | POA: Diagnosis not present

## 2022-02-03 DIAGNOSIS — Z5112 Encounter for antineoplastic immunotherapy: Secondary | ICD-10-CM | POA: Diagnosis not present

## 2022-02-03 DIAGNOSIS — E876 Hypokalemia: Secondary | ICD-10-CM | POA: Diagnosis not present

## 2022-02-03 LAB — CMP (CANCER CENTER ONLY)
ALT: 8 U/L (ref 0–44)
AST: 14 U/L — ABNORMAL LOW (ref 15–41)
Albumin: 4.3 g/dL (ref 3.5–5.0)
Alkaline Phosphatase: 87 U/L (ref 38–126)
Anion gap: 12 (ref 5–15)
BUN: 9 mg/dL (ref 6–20)
CO2: 26 mmol/L (ref 22–32)
Calcium: 9.8 mg/dL (ref 8.9–10.3)
Chloride: 102 mmol/L (ref 98–111)
Creatinine: 0.59 mg/dL (ref 0.44–1.00)
GFR, Estimated: >60 mL/min (ref >60)
Glucose, Bld: 101 mg/dL — ABNORMAL HIGH (ref 70–99)
Potassium: 3 mmol/L — ABNORMAL LOW (ref 3.5–5.1)
Sodium: 140 mmol/L (ref 135–145)
Total Bilirubin: 0.3 mg/dL (ref 0.3–1.2)
Total Protein: 7.7 g/dL (ref 6.5–8.1)

## 2022-02-03 LAB — CBC WITH DIFFERENTIAL (CANCER CENTER ONLY)
Abs Immature Granulocytes: 0.01 10*3/uL (ref 0.00–0.07)
Basophils Absolute: 0 10*3/uL (ref 0.0–0.1)
Basophils Relative: 1 %
Eosinophils Absolute: 0.7 10*3/uL — ABNORMAL HIGH (ref 0.0–0.5)
Eosinophils Relative: 11 %
HCT: 38.8 % (ref 36.0–46.0)
Hemoglobin: 13.3 g/dL (ref 12.0–15.0)
Immature Granulocytes: 0 %
Lymphocytes Relative: 38 %
Lymphs Abs: 2.3 10*3/uL (ref 0.7–4.0)
MCH: 32.7 pg (ref 26.0–34.0)
MCHC: 34.3 g/dL (ref 30.0–36.0)
MCV: 95.3 fL (ref 80.0–100.0)
Monocytes Absolute: 0.5 10*3/uL (ref 0.1–1.0)
Monocytes Relative: 8 %
Neutro Abs: 2.6 10*3/uL (ref 1.7–7.7)
Neutrophils Relative %: 42 %
Platelet Count: 301 10*3/uL (ref 150–400)
RBC: 4.07 MIL/uL (ref 3.87–5.11)
RDW: 13.6 % (ref 11.5–15.5)
WBC Count: 6.2 10*3/uL (ref 4.0–10.5)
nRBC: 0 % (ref 0.0–0.2)

## 2022-02-03 LAB — CEA (ACCESS): CEA (CHCC): 9.63 ng/mL — ABNORMAL HIGH (ref 0.00–5.00)

## 2022-02-03 LAB — TOTAL PROTEIN, URINE DIPSTICK: Protein, ur: 100 mg/dL — AB

## 2022-02-03 MED ORDER — LONSURF 20-8.19 MG PO TABS
ORAL_TABLET | ORAL | 0 refills | Status: DC
  Filled 2022-02-03: qty 50, 28d supply, fill #0

## 2022-02-03 MED ORDER — SODIUM CHLORIDE 0.9 % IV SOLN
5.0000 mg/kg | Freq: Once | INTRAVENOUS | Status: AC
  Administered 2022-02-03: 350 mg via INTRAVENOUS
  Filled 2022-02-03: qty 14

## 2022-02-03 MED ORDER — HYDROCODONE-ACETAMINOPHEN 10-325 MG PO TABS
1.0000 | ORAL_TABLET | Freq: Four times a day (QID) | ORAL | 0 refills | Status: DC | PRN
  Filled 2022-02-03: qty 30, 8d supply, fill #0

## 2022-02-03 MED ORDER — SODIUM CHLORIDE 0.9 % IV SOLN: Freq: Once | INTRAVENOUS | Status: AC

## 2022-02-03 MED ORDER — HYDROCODONE-ACETAMINOPHEN 10-325 MG PO TABS: 1.0000 | ORAL_TABLET | Freq: Four times a day (QID) | ORAL | 0 refills | Status: DC | PRN

## 2022-02-03 MED ORDER — HYDROCHLOROTHIAZIDE 25 MG PO TABS
25.0000 mg | ORAL_TABLET | Freq: Every day | ORAL | 3 refills | Status: DC
  Filled 2022-02-03: qty 30, 30d supply, fill #0

## 2022-02-03 NOTE — Progress Notes (Signed)
Discussed with patient that  per Dr. Benay Spice: she needs to take 40 meq of her liquid when she gets home and start taking it consistently every day.  Also added to her After Visit Summary

## 2022-02-03 NOTE — Patient Instructions (Signed)
Per Dr. Sherrill: she needs to take 40 meq of her liquid when she gets home and start taking it consistently every day.    [facility]   Discharge Instructions: Thank you for choosing [facility] to provide your oncology and hematology care.   If you have a lab appointment with the Cancer Center, please go directly to the Cancer Center and check in at the registration area.   Wear comfortable clothing and clothing appropriate for easy access to any Portacath or PICC line.   We strive to give you quality time with your provider. You may need to reschedule your appointment if you arrive late (15 or more minutes).  Arriving late affects you and other patients whose appointments are after yours.  Also, if you miss three or more appointments without notifying the office, you may be dismissed from the clinic at the provider's discretion.      For prescription refill requests, have your pharmacy contact our office and allow 72 hours for refills to be completed.    Today you received the following chemotherapy and/or immunotherapy agents Avastin.      To help prevent nausea and vomiting after your treatment, we encourage you to take your nausea medication as directed.  BELOW ARE SYMPTOMS THAT SHOULD BE REPORTED IMMEDIATELY: *FEVER GREATER THAN 100.4 F (38 C) OR HIGHER *CHILLS OR SWEATING *NAUSEA AND VOMITING THAT IS NOT CONTROLLED WITH YOUR NAUSEA MEDICATION *UNUSUAL SHORTNESS OF BREATH *UNUSUAL BRUISING OR BLEEDING *URINARY PROBLEMS (pain or burning when urinating, or frequent urination) *BOWEL PROBLEMS (unusual diarrhea, constipation, pain near the anus) TENDERNESS IN MOUTH AND THROAT WITH OR WITHOUT PRESENCE OF ULCERS (sore throat, sores in mouth, or a toothache) UNUSUAL RASH, SWELLING OR PAIN  UNUSUAL VAGINAL DISCHARGE OR ITCHING   Items with * indicate a potential emergency and should be followed up as soon as possible or go to the  Emergency Department if any problems should occur.  Please show the CHEMOTHERAPY ALERT CARD or IMMUNOTHERAPY ALERT CARD at check-in to the Emergency Department and triage nurse.  Should you have questions after your visit or need to cancel or reschedule your appointment, please contact [facility]  Dept: [phone]  and follow the prompts.  Office hours are 8:00 a.m. to 4:30 p.m. Monday - Friday. Please note that voicemails left after 4:00 p.m. may not be returned until the following business day.  We are closed weekends and major holidays. You have access to a nurse at all times for urgent questions. Please call the main number to the clinic Dept: [phone] and follow the prompts.   For any non-urgent questions, you may also contact your provider using MyChart. We now offer e-Visits for anyone 18 and older to request care online for non-urgent symptoms. For details visit [email or web address].   Also download the MyChart app! Go to the app store, search "MyChart", open the app, select [facility], and log in with your MyChart username and password.  Bevacizumab Injection What is this medication? BEVACIZUMAB (be va SIZ yoo mab) treats some types of cancer. It works by blocking a protein that causes cancer cells to grow and multiply. This helps to slow or stop the spread of cancer cells. It is a monoclonal antibody. This medicine may be used for other purposes; ask your health care provider or pharmacist if you have questions. COMMON BRAND NAME(S): Alymsys, Avastin, MVASI, Zirabev What should I tell my care team before   I take this medication? They need to know if you have any of these conditions: Blood clots Coughing up blood Having or recent surgery Heart failure High blood pressure History of a connection between 2 or more body parts that do not usually connect (fistula) History of a tear in your stomach or intestines Protein in your urine An  unusual or allergic reaction to bevacizumab, other medications, foods, dyes, or preservatives Pregnant or trying to get pregnant Breast-feeding How should I use this medication? This medication is injected into a vein. It is given by your care team in a hospital or clinic setting. Talk to your care team the use of this medication in children. Special care may be needed. Overdosage: If you think you have taken too much of this medicine contact a poison control center or emergency room at once. NOTE: This medicine is only for you. Do not share this medicine with others. What if I miss a dose? Keep appointments for follow-up doses. It is important not to miss your dose. Call your care team if you are unable to keep an appointment. What may interact with this medication? Interactions are not expected. This list may not describe all possible interactions. Give your health care provider a list of all the medicines, herbs, non-prescription drugs, or dietary supplements you use. Also tell them if you smoke, drink alcohol, or use illegal drugs. Some items may interact with your medicine. What should I watch for while using this medication? Your condition will be monitored carefully while you are receiving this medication. You may need blood work while taking this medication. This medication may make you feel generally unwell. This is not uncommon as chemotherapy can affect healthy cells as well as cancer cells. Report any side effects. Continue your course of treatment even though you feel ill unless your care team tells you to stop. This medication may increase your risk to bruise or bleed. Call your care team if you notice any unusual bleeding. Before having surgery, talk to your care team to make sure it is ok. This medication can increase the risk of poor healing of your surgical site or wound. You will need to stop this medication for 28 days before surgery. After surgery, wait at least 28 days before  restarting this medication. Make sure the surgical site or wound is healed enough before restarting this medication. Talk to your care team if questions. Talk to your care team if you may be pregnant. Serious birth defects can occur if you take this medication during pregnancy and for 6 months after the last dose. Contraception is recommended while taking this medication and for 6 months after the last dose. Your care team can help you find the option that works for you. Do not breastfeed while taking this medication and for 6 months after the last dose. This medication can cause infertility. Talk to your care team if you are concerned about your fertility. What side effects may I notice from receiving this medication? Side effects that you should report to your care team as soon as possible: Allergic reactions--skin rash, itching, hives, swelling of the face, lips, tongue, or throat Bleeding--bloody or black, tar-like stools, vomiting blood or brown material that looks like coffee grounds, red or dark brown urine, small red or purple spots on skin, unusual bruising or bleeding Blood clot--pain, swelling, or warmth in the leg, shortness of breath, chest pain Heart attack--pain or tightness in the chest, shoulders, arms, or jaw, nausea, shortness of breath,   cold or clammy skin, feeling faint or lightheaded Heart failure--shortness of breath, swelling of the ankles, feet, or hands, sudden weight gain, unusual weakness or fatigue Increase in blood pressure Infection--fever, chills, cough, sore throat, wounds that don't heal, pain or trouble when passing urine, general feeling of discomfort or being unwell Infusion reactions--chest pain, shortness of breath or trouble breathing, feeling faint or lightheaded Kidney injury--decrease in the amount of urine, swelling of the ankles, hands, or feet Stomach pain that is severe, does not go away, or gets worse Stroke--sudden numbness or weakness of the face,  arm, or leg, trouble speaking, confusion, trouble walking, loss of balance or coordination, dizziness, severe headache, change in vision Sudden and severe headache, confusion, change in vision, seizures, which may be signs of posterior reversible encephalopathy syndrome (PRES) Side effects that usually do not require medical attention (report to your care team if they continue or are bothersome): Back pain Change in taste Diarrhea Dry skin Increased tears Nosebleed This list may not describe all possible side effects. Call your doctor for medical advice about side effects. You may report side effects to FDA at 1-800-FDA-1088. Where should I keep my medication? This medication is given in a hospital or clinic. It will not be stored at home. NOTE: This sheet is a summary. It may not cover all possible information. If you have questions about this medicine, talk to your doctor, pharmacist, or health care provider.  2023 Elsevier/Gold Standard (2021-05-03 00:00:00)  Hypokalemia Hypokalemia means that the amount of potassium in the blood is lower than normal. Potassium is a mineral (electrolyte) that helps regulate the amount of fluid in the body. It also stimulates muscle tightening (contraction) and helps nerves work properly. Normally, most of the body's potassium is inside cells, and only a very small amount is in the blood. Because the amount in the blood is so small, minor changes to potassium levels in the blood can be life-threatening. What are the causes? This condition may be caused by: Antibiotic medicine. Diarrhea or vomiting. Taking too much of a medicine that helps you have a bowel movement (laxative) can cause diarrhea and lead to hypokalemia. Chronic kidney disease (CKD). Medicines that help the body get rid of excess fluid (diuretics). Eating disorders, such as anorexia or bulimia. Low magnesium levels in the body. Sweating a lot. What are the signs or symptoms? Symptoms of  this condition include: Weakness. Constipation. Fatigue. Muscle cramps. Mental confusion. Skipped heartbeats or irregular heartbeat (palpitations). Tingling or numbness. How is this diagnosed? This condition is diagnosed with a blood test. How is this treated? This condition may be treated by: Taking potassium supplements. Adjusting the medicines that you take. Eating more foods that contain a lot of potassium. If your potassium level is very low, you may need to get potassium through an IV and be monitored in the hospital. Follow these instructions at home: Eating and drinking  Eat a healthy diet. A healthy diet includes fresh fruits and vegetables, whole grains, healthy fats, and lean proteins. If told, eat more foods that contain a lot of potassium. These include: Nuts, such as peanuts and pistachios. Seeds, such as sunflower seeds and pumpkin seeds. Peas, lentils, and lima beans. Whole grain and bran cereals and breads. Fresh fruits and vegetables, such as apricots, avocado, bananas, cantaloupe, kiwi, oranges, tomatoes, asparagus, and potatoes. Juices, such as orange, tomato, and prune. Lean meats, including fish. Milk and milk products, such as yogurt. General instructions Take over-the-counter and prescription medicines only as   told by your health care provider. This includes vitamins, natural food products, and supplements. Keep all follow-up visits. This is important. Contact a health care provider if: You have weakness that gets worse. You feel your heart pounding or racing. You vomit. You have diarrhea. You have diabetes and you have trouble keeping your blood sugar in your target range. Get help right away if: You have chest pain. You have shortness of breath. You have vomiting or diarrhea that lasts for more than 2 days. You faint. These symptoms may be an emergency. Get help right away. Call 911. Do not wait to see if the symptoms will go away. Do not drive  yourself to the hospital. Summary Hypokalemia means that the amount of potassium in the blood is lower than normal. This condition is diagnosed with a blood test. Hypokalemia may be treated by taking potassium supplements, adjusting the medicines that you take, or eating more foods that are high in potassium. If your potassium level is very low, you may need to get potassium through an IV and be monitored in the hospital. This information is not intended to replace advice given to you by your health care provider. Make sure you discuss any questions you have with your health care provider. Document Revised: 09/13/2020 Document Reviewed: 09/13/2020 Elsevier Patient Education  2023 Elsevier Inc. 

## 2022-02-03 NOTE — Telephone Encounter (Signed)
Ms. Devargas did not have her photo ID today and was not able to pick up hydrocodone script and is requesting it be sen to [facility] on [address]. Forwarded to provider. Patient made aware it will be done at end of day. [facility] to cancel the refill sent today.

## 2022-02-03 NOTE — Progress Notes (Signed)
[facility] OFFICE PROGRESS NOTE   Diagnosis: Colon cancer  INTERVAL HISTORY:   [name] returns as scheduled.  Bevacizumab was held 2 weeks ago due to proteinuria.  She reports discomfort in the arms.  She has mild discomfort in the right upper abdomen.  Objective:  Vital signs in last 24 hours:  Blood pressure (!) 150/80, pulse 79, temperature 98.1 F (36.7 C), temperature source Oral, resp. rate 18, height '5\' 4"'$  (1.626 m), weight 158 lb (71.7 kg), last menstrual period 01/07/2006, SpO2 100 %.    HEENT: No thrush or ulcers Resp: Lungs clear bilaterally Cardio: Regular rate and rhythm GI: Mild diffuse tenderness, no mass, no hepatosplenomegaly Vascular: No leg edema    Lab Results:  Lab Results  Component Value Date   WBC 6.2 02/03/2022   HGB 13.3 02/03/2022   HCT 38.8 02/03/2022   MCV 95.3 02/03/2022   PLT 301 02/03/2022   NEUTROABS 2.6 02/03/2022    CMP  Lab Results  Component Value Date   NA 140 02/03/2022   K 3.0 (L) 02/03/2022   CL 102 02/03/2022   CO2 26 02/03/2022   GLUCOSE 101 (H) 02/03/2022   BUN 9 02/03/2022   CREATININE 0.59 02/03/2022   CALCIUM 9.8 02/03/2022   PROT 7.7 02/03/2022   ALBUMIN 4.3 02/03/2022   AST 14 (L) 02/03/2022   ALT 8 02/03/2022   ALKPHOS 87 02/03/2022   BILITOT 0.3 02/03/2022   GFRNONAA >60 02/03/2022   GFRAA >60 10/17/2019    Lab Results  Component Value Date   CEA1 8.66 (H) 07/02/2020   CEA 9.63 (H) 02/03/2022    Imaging:  CT ABDOMEN PELVIS WO CONTRAST  Result Date: 01/31/2022 CLINICAL DATA:  Colon cancer. Assess treatment response. * Tracking Code: BO * EXAM: CT ABDOMEN AND PELVIS WITHOUT CONTRAST TECHNIQUE: Multidetector CT imaging of the abdomen and pelvis was performed following the standard protocol without IV contrast. RADIATION DOSE REDUCTION: This exam was performed according to the departmental dose-optimization program which includes automated exposure control, adjustment of the mA  and/or kV according to patient size and/or use of iterative reconstruction technique. COMPARISON:  Abdominopelvic CT 10/04/2021 and 07/05/2021. FINDINGS: Lower chest: Clear lung bases. No significant pleural or pericardial effusion. Hepatobiliary: No focal hepatic abnormalities are identified on noncontrast imaging. There is increased density throughout the gallbladder lumen without discrete gallstones. No gallbladder wall thickening, surrounding inflammation or biliary ductal dilatation. No evidence of gallstones, gallbladder wall thickening or biliary dilatation. Pancreas: Unremarkable. No pancreatic ductal dilatation or surrounding inflammatory changes. Spleen: Normal in size without focal abnormality. Adrenals/Urinary Tract: Both adrenal glands appear normal. Interval removal of a double-J left ureteral stent. Nonobstructing bilateral renal calculi are unchanged. There is no evidence of hydronephrosis, perinephric soft tissue stranding or ureteral calculus. Grossly stable cyst in the interpolar region of the left kidney for which no specific follow-up is recommended. The bladder appears unremarkable for its degree of distention. Stomach/Bowel: Enteric contrast was administered and has passed into the distal colon. The stomach appears unremarkable for its degree of distention. There is no small bowel distension, wall thickening or surrounding inflammation. Chronic rectal wall thickening appears unchanged. Focal soft tissue thickening along the superior aspect of the mid transverse colon is noted on image 27/2, not clearly seen previously and possibly related to an adjacent peritoneal implant. No evidence of bowel obstruction or perforation. Vascular/Lymphatic: There are no enlarged abdominal or pelvic lymph nodes. Mild aortoiliac atherosclerosis without evidence of aneurysm. Reproductive: The uterus and  ovaries appear unremarkable. No adnexal mass. Other: As above, possible new peritoneal implant along the  superior aspect of the transverse colon. Multiple other omental/peritoneal implants are again noted which are grossly unchanged from the most recent study allowing for changes in position of the colon and omentum. Largest nodules measure 2.6 x 1.1 cm on image 23/2, 1.4 x 1.1 cm on image 26/2 and 1.6 x 1.3 cm on image 32/2. A small amount of pelvic ascites appears unchanged. Musculoskeletal: No acute or significant osseous findings. Unchanged bilateral L5 pars defects with associated grade 2 anterolisthesis and moderate chronic foraminal narrowing bilaterally at L5-S1. There is a convex left lumbar scoliosis. IMPRESSION: 1. Possible new peritoneal implant along the superior aspect of the mid transverse colon. Multiple other omental/peritoneal implants are grossly unchanged from the most recent study allowing for changes in position of the colon and omentum. 2. No evidence of bowel obstruction or perforation. 3. Interval removal of a double-J left ureteral stent. No evidence of hydronephrosis or ureteral calculus. Nonobstructing bilateral renal calculi are unchanged. 4. Stable chronic rectal wall thickening. 5. 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 6.  Aortic Atherosclerosis (ICD10-I70.0). Electronically Signed   By: Richardean Sale M.D.   On: 01/31/2022 10:50    Medications: I have reviewed the patient's current medications.   Assessment/Plan: Sigmoid colon cancer, stage IV (pT4a,pN2b,M1c) Colonoscopy 03/24/2019-3 rectal polyps-hyperplastic polyps, distal colon biopsy-at least intramucosal adenocarcinoma, completely obstructing mass in the distal sigmoid colon, could not be traversed, intact mismatch repair protein expression 03/24/2019-CEA 56.1 03/29/2019 CT abdomen/pelvis-circumferential thickening involving the entire mid and distal sigmoid colon to the level of the rectosigmoid junction, solid/cystic mass in  the left ovary, bilateral nephrolithiasis Robotic assisted low anterior resection, mesenteric lymphadenectomy, bilateral salpingo-oophorectomy 04/08/2019 Pathology ([facility] review of outside pathology) metastatic adenocarcinoma involving the peritoneum overlying the round ligament, serosa of the urinary bladder, serosa of the right ureter a sacral area, pelvic peritoneum, and left ovary.  Omental biopsy with focal mucin pools with no carcinoma cells identified, right hemidiaphragm biopsy involved by metastatic adenocarcinoma, invasive adenocarcinoma the sigmoid colon, moderately differentiated, T4a, perineural and vascular invasion present, 7/22 lymph nodes, multiple tumor deposits, resection margins negative Negative for PD-L1, low probability of MSI-high, HER-2 negative, negative for BRAF, NRAS and KRAS alterations CTs 05/19/2019-no evidence of metastatic disease, findings suspicious for colitis of the transverse and ascending colon, small amount of ascites in the cul-de-sac, bilateral renal calculi Cycle 1 FOLFIRI 05/24/2019, bevacizumab added with cycle 2 Cycle 4 FOLFIRI/bevacizumab 07/05/2019 Cycle 5 FOLFIRI 07/27/2019, bevacizumab held secondary to hypertension Cycle 6 FOLFIRI 08/10/1999, bevacizumab held secondary to hypertension CTs 08/30/2019-no evidence of recurrent disease, new subsolid right lower lobe nodule felt to be inflammatory, emphysema Cycle 7 FOLFIRI 09/26/2019, bevacizumab remains on hold secondary to hypertension Cycle 8 FOLFIRI 10/17/2019, bevacizumab held secondary to hypertension, Udenyca added for neutropenia Cycle 9 FOLFIRI 11/07/2019, bevacizumab held secondary to hypertension, Udenyca Cycle 10 FOLFIRI 11/28/2019, bevacizumab held, Udenyca Cycle 11 FOLFIRI 12/26/2019, bevacizumab held, Udenyca CTs 01/25/2020-no evidence of recurrent disease, resolution of right lower lobe nodule Maintenance Xeloda beginning 02/06/2020 CTs 04/25/2020- no evidence of metastatic disease, multiple bilateral  renal calculi without hydronephrosis Maintenance Xeloda continued CT abdomen/pelvis without contrast 08/10/2020-bilateral staghorn renal calculi, no evidence of metastatic disease; addendum 08/23/2020-small but increasing omental nodules. Cycle 1 FOLFIRI/panitumumab 09/19/2020 Cycle 2 FOLFIRI/Panitumumab 10/03/2020 Cycle 3 FOLFIRI/Panitumumab 10/17/2020 Cycle 4 FOLFIRI/panitumumab 10/31/2020 Cycle 5 FOLFIRI/Panitumumab 11/14/2020 CTs 11/26/2020-stable omental metastases  compared to 10/22/2020, mildly decreased from 08/10/2020.  Left lower quadrant tiny paracolic gutter implant slightly decreased from CT 08/10/2020.  No new or progressive metastatic disease in the abdomen or pelvis. Cycle 6 FOLFIRI/Panitumumab 11/28/2020, irinotecan dose reduced, treatment schedule adjusted to every 3 weeks going forward Cycle 7 FOLFIRI/panitumumab 12/24/2020 Cycle 8 FOLFIRI/Panitumumab 01/16/2021--treatment held due to hypertension in the infusion area. Cycle 8 FOLFIRI/panitumumab 01/21/2021 Cycle 9 FOLFIRI/panitumumab 02/11/2021 Cycle 10 FOLFIRI/Panitumumab 03/04/2021 Cycle 11 FOLFIRI/Panitumumab 03/25/2021 CT abdomen/pelvis 04/10/2021-stable right omental and left posterior paracolic gutter nodules Cycle 12 FOLFIRI/panitumumab 04/15/2021 Cycle 13 FOLFIRI/panitumumab 05/06/2021 Cycle 14 FOLFIRI/Panitumumab 05/27/2021 Cycle 15 FOLFIRI/Panitumumab 06/17/2021 CT abdomen/pelvis 07/05/2021-slight enlargement of a dominant right omental implant, no new implants Patient requested a treatment break CT abdomen/pelvis 10/04/2021-mild increase in size of multiple omental soft tissue nodules, 4 mm calculus in the distal left ureter adjacent to the ureteral stent Cycle 1 Lonsurf 10/14/2021 10/28/2021 Avastin every 2 weeks Cycle 2 Lonsurf 11/11/2021 Cycle 3 Lonsurf 12/09/2021 Cycle 4 Lonsurf 01/06/2022 Avastin held 01/20/2022 due to proteinuria, 24-hour urine 43 mg CT/pelvis 01/30/2022-possible new peritoneal implant at the transverse colon, no  significant change in other omental/peritoneal implants, stable chronic rectal wall thickening, status post removal of double-J left ureteral stent with no evidence of hydronephrosis or ureteral calculus, nonobstructing bilateral renal calculi Cycle 5 Lonsurf   Hypertension G4 P3, twins Kidney stones-bilateral staghorn renal calculi on CT 08/10/2020 Infected Port-A-Cath 09/12/2019-placed on Augmentin, referred for Port-A-Cath removal; Port-A-Cath removed 09/14/2019; PICC line placed 09/14/2019; culture staph aureus.  Course of Septra completed. Neutropenia secondary to chemotherapy-Udenyca added with cycle 8 FOLFIRI Right nephrostomy tube 09/12/2020; stent placed 10/09/2020, percutaneous nephrostolithotomy treatment of right-sided kidney stones 10/09/2020, malpositioned right ureter stent replacement 10/23/2020, right ureter stent removed 10/29/2020 Percutaneous left nephrostolithotomy 10/01/2021-no renal stone identified, left ureter stent left in place Cystoscopy 10/14/2021-stent removed    Disposition: Ms. Bickle appears unchanged.  The restaging CTs reveal no clear evidence of disease progression.  The CEA is lower.  She is tolerating the Lonsurf/bevacizumab well.  The 24-hour urine did not reveal significant proteinuria.  Every 2-week bevacizumab will be resumed today.  She will begin another cycle of Lonsurf within the next few days.  I reviewed the CT findings and images with her.  I refilled her prescription for hydrocodone.  Betsy Coder, MD  02/03/2022  9:56 AM

## 2022-02-03 NOTE — Progress Notes (Signed)
Dose of Bevacizumab will be adjusted using today's wt (71.7 kg)=> 350 mg.  OK per Dr. Benay Spice.  Kennith Center, Pharm.D., CPP 02/03/2022'@11'$ :16 AM

## 2022-02-03 NOTE — Progress Notes (Signed)
Patient seen by Dr. Benay Spice today  Vitals are within treatment parameters.   Labs reviewed by Dr. Benay Spice and are NOT within treatment parameters--Urine protein 100 today.  Patient recently completed a 24-hr urine on 01/27/22 which showed normal levels.    Per Dr. Benay Spice, patient is ok to proceed with treatment and there are NO modifications to the treatment plan.

## 2022-02-04 ENCOUNTER — Other Ambulatory Visit: Payer: Self-pay

## 2022-02-04 ENCOUNTER — Other Ambulatory Visit (HOSPITAL_COMMUNITY): Payer: Self-pay

## 2022-02-05 ENCOUNTER — Other Ambulatory Visit (HOSPITAL_COMMUNITY): Payer: Self-pay

## 2022-02-10 ENCOUNTER — Other Ambulatory Visit (HOSPITAL_COMMUNITY): Payer: Self-pay

## 2022-02-10 ENCOUNTER — Other Ambulatory Visit: Payer: Self-pay

## 2022-02-12 ENCOUNTER — Ambulatory Visit: Payer: 59 | Admitting: Dietician

## 2022-02-12 ENCOUNTER — Other Ambulatory Visit: Payer: Self-pay

## 2022-02-12 DIAGNOSIS — C20 Malignant neoplasm of rectum: Secondary | ICD-10-CM

## 2022-02-12 NOTE — Progress Notes (Signed)
Nutrition Assessment   Reason for Assessment: MST screen for weight loss.    ASSESSMENT: Patient is a 47 year old female with stage IV colon cancer being treated with Lonsurf/bevacizumab by Dr. Benay Spice.  She has PMHx that includes HTN, sepsis, UTI and kidney stones.  Called patient at home/cell phone.  She reports recent weight loss is r/t nausea and she is feeling a little better.  She has trouble digesting red meats, some lactose intolerance hasn't tried any dairy for a while, also has pain with eggs.  She uses lots of fish and chicken as her main protein source but much of her diet is vegetables and fruits.  She admits she isn't taking all meds as prescribed and states it is due to nausea. Bowels only moving once a week.  She doesn't want more meds to help with constipation. Fluid intake 2-4 bottles per day, sometimes OJ at breakfast, avoids caffeinated hot liquids as advised by MD for kidney stones.  Medications: KCl, compazine  Labs: 02/03/22  K+ 3.0  Anthropometrics: Weight loss 8# (4.8%) x 2 weeks last month.  Rebound and fluctuations since.  Height: 64" Weight: 158# UBW: decreasing  BMI: 27.12  INTERVENTION:   Educated on importance of adequate calorie and protein energy intake  with nutrient dense foods when possible to maintain weight/strength.  Reviewed strategies for nausea control and potential use of ginger products so she can maintain meds as prescribed..  Encouraged high potassium foods and taking liquid as prescribed.   Emailed Nutrition Tip sheet  for  High Potassium foods, constipation, and plant based protein options with contact information provided   MONITORING, EVALUATION, GOAL: weight, PO intake, Nutrition Impact Symptoms, labs Goal is weight maintenance  Next Visit: PRN at patient or provider request  April Manson, RDN, LDN Registered Dietitian, [place] (Usual office hours: Tuesday-Thursday) Cell: [phone]

## 2022-02-16 ENCOUNTER — Other Ambulatory Visit: Payer: Self-pay | Admitting: Oncology

## 2022-02-17 ENCOUNTER — Inpatient Hospital Stay (HOSPITAL_BASED_OUTPATIENT_CLINIC_OR_DEPARTMENT_OTHER): Payer: 59 | Admitting: Nurse Practitioner

## 2022-02-17 ENCOUNTER — Inpatient Hospital Stay: Payer: 59 | Attending: Oncology

## 2022-02-17 ENCOUNTER — Encounter: Payer: Self-pay | Admitting: Nurse Practitioner

## 2022-02-17 ENCOUNTER — Other Ambulatory Visit: Payer: Self-pay | Admitting: Nurse Practitioner

## 2022-02-17 ENCOUNTER — Inpatient Hospital Stay: Payer: 59

## 2022-02-17 VITALS — BP 145/86 | HR 95 | Resp 20

## 2022-02-17 VITALS — BP 140/82 | HR 90 | Temp 98.2°F | Resp 18 | Ht 64.0 in | Wt 165.0 lb

## 2022-02-17 DIAGNOSIS — C20 Malignant neoplasm of rectum: Secondary | ICD-10-CM

## 2022-02-17 DIAGNOSIS — Z79899 Other long term (current) drug therapy: Secondary | ICD-10-CM | POA: Insufficient documentation

## 2022-02-17 DIAGNOSIS — C7989 Secondary malignant neoplasm of other specified sites: Secondary | ICD-10-CM | POA: Insufficient documentation

## 2022-02-17 DIAGNOSIS — Z5112 Encounter for antineoplastic immunotherapy: Secondary | ICD-10-CM | POA: Diagnosis not present

## 2022-02-17 DIAGNOSIS — C786 Secondary malignant neoplasm of retroperitoneum and peritoneum: Secondary | ICD-10-CM

## 2022-02-17 DIAGNOSIS — C187 Malignant neoplasm of sigmoid colon: Secondary | ICD-10-CM | POA: Insufficient documentation

## 2022-02-17 DIAGNOSIS — C7962 Secondary malignant neoplasm of left ovary: Secondary | ICD-10-CM | POA: Insufficient documentation

## 2022-02-17 LAB — CBC WITH DIFFERENTIAL (CANCER CENTER ONLY)
Abs Immature Granulocytes: 0.01 10*3/uL (ref 0.00–0.07)
Basophils Absolute: 0.1 10*3/uL (ref 0.0–0.1)
Basophils Relative: 1 %
Eosinophils Absolute: 0.9 10*3/uL — ABNORMAL HIGH (ref 0.0–0.5)
Eosinophils Relative: 15 %
HCT: 38.7 % (ref 36.0–46.0)
Hemoglobin: 13.2 g/dL (ref 12.0–15.0)
Immature Granulocytes: 0 %
Lymphocytes Relative: 44 %
Lymphs Abs: 2.8 10*3/uL (ref 0.7–4.0)
MCH: 32.8 pg (ref 26.0–34.0)
MCHC: 34.1 g/dL (ref 30.0–36.0)
MCV: 96.3 fL (ref 80.0–100.0)
Monocytes Absolute: 0.5 10*3/uL (ref 0.1–1.0)
Monocytes Relative: 7 %
Neutro Abs: 2.1 10*3/uL (ref 1.7–7.7)
Neutrophils Relative %: 33 %
Platelet Count: 275 10*3/uL (ref 150–400)
RBC: 4.02 MIL/uL (ref 3.87–5.11)
RDW: 13.8 % (ref 11.5–15.5)
WBC Count: 6.4 10*3/uL (ref 4.0–10.5)
nRBC: 0 % (ref 0.0–0.2)

## 2022-02-17 LAB — CMP (CANCER CENTER ONLY)
ALT: 16 U/L (ref 0–44)
AST: 20 U/L (ref 15–41)
Albumin: 4.1 g/dL (ref 3.5–5.0)
Alkaline Phosphatase: 94 U/L (ref 38–126)
Anion gap: 9 (ref 5–15)
BUN: 13 mg/dL (ref 6–20)
CO2: 25 mmol/L (ref 22–32)
Calcium: 9.3 mg/dL (ref 8.9–10.3)
Chloride: 106 mmol/L (ref 98–111)
Creatinine: 0.79 mg/dL (ref 0.44–1.00)
GFR, Estimated: >60 mL/min (ref >60)
Glucose, Bld: 113 mg/dL — ABNORMAL HIGH (ref 70–99)
Potassium: 3.6 mmol/L (ref 3.5–5.1)
Sodium: 140 mmol/L (ref 135–145)
Total Bilirubin: 0.2 mg/dL — ABNORMAL LOW (ref 0.3–1.2)
Total Protein: 7.2 g/dL (ref 6.5–8.1)

## 2022-02-17 LAB — CEA (ACCESS): CEA (CHCC): 10.58 ng/mL — ABNORMAL HIGH (ref 0.00–5.00)

## 2022-02-17 LAB — TOTAL PROTEIN, URINE DIPSTICK: Protein, ur: NEGATIVE mg/dL

## 2022-02-17 MED ORDER — SODIUM CHLORIDE 0.9 % IV SOLN
5.0000 mg/kg | Freq: Once | INTRAVENOUS | Status: AC
  Administered 2022-02-17: 350 mg via INTRAVENOUS
  Filled 2022-02-17: qty 14

## 2022-02-17 MED ORDER — SODIUM CHLORIDE 0.9 % IV SOLN: Freq: Once | INTRAVENOUS | Status: AC

## 2022-02-17 NOTE — Progress Notes (Signed)
[place] OFFICE PROGRESS NOTE   Diagnosis: Colon cancer   INTERVAL HISTORY:   April [name] returns as scheduled.  She began cycle 5 Lonsurf 02/05/2022 or 02/06/2022.  She completed another treatment with Avastin 02/03/2022.  She had a few episodes of nausea/vomiting.  She thinks she may have had "food poisoning".  No mouth sores.  No diarrhea.  No hand or foot pain or redness.  No bleeding.  Objective:  Vital signs in last 24 hours:  Blood pressure (!) 140/82, pulse 90, temperature 98.2 F (36.8 C), temperature source Oral, resp. rate 18, height '5\' 4"'$  (1.626 m), weight 165 lb (74.8 kg), last menstrual period 01/07/2006, SpO2 100 %.    HEENT: No thrush or ulcers. Resp: Lungs clear bilaterally. Cardio: Regular rate and rhythm. GI: Abdomen soft and nontender.  No hepatosplenomegaly.  No mass. Vascular: No leg edema. Neuro: Alert and oriented. Skin: Palms without erythema.   Lab Results:  Lab Results  Component Value Date   WBC 6.4 02/17/2022   HGB 13.2 02/17/2022   HCT 38.7 02/17/2022   MCV 96.3 02/17/2022   PLT 275 02/17/2022   NEUTROABS 2.1 02/17/2022    Imaging:  No results found.  Medications: I have reviewed the patient's current medications.  Assessment/Plan: Sigmoid colon cancer, stage IV (pT4a,pN2b,M1c) Colonoscopy 03/24/2019-3 rectal polyps-hyperplastic polyps, distal colon biopsy-at least intramucosal adenocarcinoma, completely obstructing mass in the distal sigmoid colon, could not be traversed, intact mismatch repair protein expression 03/24/2019-CEA 56.1 03/29/2019 CT abdomen/pelvis-circumferential thickening involving the entire mid and distal sigmoid colon to the level of the rectosigmoid junction, solid/cystic mass in the left ovary, bilateral nephrolithiasis Robotic assisted low anterior resection, mesenteric lymphadenectomy, bilateral salpingo-oophorectomy 04/08/2019 Pathology ([facility] review of outside pathology) metastatic adenocarcinoma  involving the peritoneum overlying the round ligament, serosa of the urinary bladder, serosa of the right ureter a sacral area, pelvic peritoneum, and left ovary.  Omental biopsy with focal mucin pools with no carcinoma cells identified, right hemidiaphragm biopsy involved by metastatic adenocarcinoma, invasive adenocarcinoma the sigmoid colon, moderately differentiated, T4a, perineural and vascular invasion present, 7/22 lymph nodes, multiple tumor deposits, resection margins negative Negative for PD-L1, low probability of MSI-high, HER-2 negative, negative for BRAF, NRAS and KRAS alterations CTs 05/19/2019-no evidence of metastatic disease, findings suspicious for colitis of the transverse and ascending colon, small amount of ascites in the cul-de-sac, bilateral renal calculi Cycle 1 FOLFIRI 05/24/2019, bevacizumab added with cycle 2 Cycle 4 FOLFIRI/bevacizumab 07/05/2019 Cycle 5 FOLFIRI 07/27/2019, bevacizumab held secondary to hypertension Cycle 6 FOLFIRI 08/10/1999, bevacizumab held secondary to hypertension CTs 08/30/2019-no evidence of recurrent disease, new subsolid right lower lobe nodule felt to be inflammatory, emphysema Cycle 7 FOLFIRI 09/26/2019, bevacizumab remains on hold secondary to hypertension Cycle 8 FOLFIRI 10/17/2019, bevacizumab held secondary to hypertension, Udenyca added for neutropenia Cycle 9 FOLFIRI 11/07/2019, bevacizumab held secondary to hypertension, Udenyca Cycle 10 FOLFIRI 11/28/2019, bevacizumab held, Udenyca Cycle 11 FOLFIRI 12/26/2019, bevacizumab held, Udenyca CTs 01/25/2020-no evidence of recurrent disease, resolution of right lower lobe nodule Maintenance Xeloda beginning 02/06/2020 CTs 04/25/2020- no evidence of metastatic disease, multiple bilateral renal calculi without hydronephrosis Maintenance Xeloda continued CT abdomen/pelvis without contrast 08/10/2020-bilateral staghorn renal calculi, no evidence of metastatic disease; addendum 08/23/2020-small but increasing  omental nodules. Cycle 1 FOLFIRI/panitumumab 09/19/2020 Cycle 2 FOLFIRI/Panitumumab 10/03/2020 Cycle 3 FOLFIRI/Panitumumab 10/17/2020 Cycle 4 FOLFIRI/panitumumab 10/31/2020 Cycle 5 FOLFIRI/Panitumumab 11/14/2020 CTs 11/26/2020-stable omental metastases compared to 10/22/2020, mildly decreased from 08/10/2020.  Left lower quadrant tiny paracolic gutter implant slightly decreased from CT  08/10/2020.  No new or progressive metastatic disease in the abdomen or pelvis. Cycle 6 FOLFIRI/Panitumumab 11/28/2020, irinotecan dose reduced, treatment schedule adjusted to every 3 weeks going forward Cycle 7 FOLFIRI/panitumumab 12/24/2020 Cycle 8 FOLFIRI/Panitumumab 01/16/2021--treatment held due to hypertension in the infusion area. Cycle 8 FOLFIRI/panitumumab 01/21/2021 Cycle 9 FOLFIRI/panitumumab 02/11/2021 Cycle 10 FOLFIRI/Panitumumab 03/04/2021 Cycle 11 FOLFIRI/Panitumumab 03/25/2021 CT abdomen/pelvis 04/10/2021-stable right omental and left posterior paracolic gutter nodules Cycle 12 FOLFIRI/panitumumab 04/15/2021 Cycle 13 FOLFIRI/panitumumab 05/06/2021 Cycle 14 FOLFIRI/Panitumumab 05/27/2021 Cycle 15 FOLFIRI/Panitumumab 06/17/2021 CT abdomen/pelvis 07/05/2021-slight enlargement of a dominant right omental implant, no new implants Patient requested a treatment break CT abdomen/pelvis 10/04/2021-mild increase in size of multiple omental soft tissue nodules, 4 mm calculus in the distal left ureter adjacent to the ureteral stent Cycle 1 Lonsurf 10/14/2021 10/28/2021 Avastin every 2 weeks Cycle 2 Lonsurf 11/11/2021 Cycle 3 Lonsurf 12/09/2021 Cycle 4 Lonsurf 01/06/2022 Avastin held 01/20/2022 due to proteinuria, 24-hour urine 43 mg CT/pelvis 01/30/2022-possible new peritoneal implant at the transverse colon, no significant change in other omental/peritoneal implants, stable chronic rectal wall thickening, status post removal of double-J left ureteral stent with no evidence of hydronephrosis or ureteral calculus, nonobstructing  bilateral renal calculi Cycle 5 Lonsurf 02/05/2022 or 02/06/2022 Avastin every 2 weeks   Hypertension G4 P3, twins Kidney stones-bilateral staghorn renal calculi on CT 08/10/2020 Infected Port-A-Cath 09/12/2019-placed on Augmentin, referred for Port-A-Cath removal; Port-A-Cath removed 09/14/2019; PICC line placed 09/14/2019; culture staph aureus.  Course of Septra completed. Neutropenia secondary to chemotherapy-Udenyca added with cycle 8 FOLFIRI Right nephrostomy tube 09/12/2020; stent placed 10/09/2020, percutaneous nephrostolithotomy treatment of right-sided kidney stones 10/09/2020, malpositioned right ureter stent replacement 10/23/2020, right ureter stent removed 10/29/2020 Percutaneous left nephrostolithotomy 10/01/2021-no renal stone identified, left ureter stent left in place Cystoscopy 10/14/2021-stent removed    Disposition: Ms. Patton appears stable.  She has completed 5 cycles of Lonsurf.  She continues Avastin every 2 weeks.  There is no clinical evidence of disease progression.  CEA is stable.  Plan to proceed with Avastin today as scheduled.  CBC and chemistry panel reviewed.  Labs adequate to proceed as above.  She will return for lab, follow-up, Avastin in 2 weeks.    Ned Card ANP/GNP-BC   02/17/2022  9:51 AM

## 2022-02-17 NOTE — Progress Notes (Signed)
Patient seen by Lisa Thomas NP today  Vitals are within treatment parameters.  Labs reviewed by Lisa Thomas NP and are within treatment parameters.  Per physician team, patient is ready for treatment and there are NO modifications to the treatment plan.     

## 2022-02-17 NOTE — Patient Instructions (Signed)
Per Dr. Sherrill: she needs to take 40 meq of her liquid when she gets home and start taking it consistently every day.    [facility]   Discharge Instructions: Thank you for choosing [facility] to provide your oncology and hematology care.   If you have a lab appointment with the Cancer Center, please go directly to the Cancer Center and check in at the registration area.   Wear comfortable clothing and clothing appropriate for easy access to any Portacath or PICC line.   We strive to give you quality time with your provider. You may need to reschedule your appointment if you arrive late (15 or more minutes).  Arriving late affects you and other patients whose appointments are after yours.  Also, if you miss three or more appointments without notifying the office, you may be dismissed from the clinic at the provider's discretion.      For prescription refill requests, have your pharmacy contact our office and allow 72 hours for refills to be completed.    Today you received the following chemotherapy and/or immunotherapy agents Avastin.      To help prevent nausea and vomiting after your treatment, we encourage you to take your nausea medication as directed.  BELOW ARE SYMPTOMS THAT SHOULD BE REPORTED IMMEDIATELY: *FEVER GREATER THAN 100.4 F (38 C) OR HIGHER *CHILLS OR SWEATING *NAUSEA AND VOMITING THAT IS NOT CONTROLLED WITH YOUR NAUSEA MEDICATION *UNUSUAL SHORTNESS OF BREATH *UNUSUAL BRUISING OR BLEEDING *URINARY PROBLEMS (pain or burning when urinating, or frequent urination) *BOWEL PROBLEMS (unusual diarrhea, constipation, pain near the anus) TENDERNESS IN MOUTH AND THROAT WITH OR WITHOUT PRESENCE OF ULCERS (sore throat, sores in mouth, or a toothache) UNUSUAL RASH, SWELLING OR PAIN  UNUSUAL VAGINAL DISCHARGE OR ITCHING   Items with * indicate a potential emergency and should be followed up as soon as possible or go to the  Emergency Department if any problems should occur.  Please show the CHEMOTHERAPY ALERT CARD or IMMUNOTHERAPY ALERT CARD at check-in to the Emergency Department and triage nurse.  Should you have questions after your visit or need to cancel or reschedule your appointment, please contact [facility]  Dept: [phone]  and follow the prompts.  Office hours are 8:00 a.m. to 4:30 p.m. Monday - Friday. Please note that voicemails left after 4:00 p.m. may not be returned until the following business day.  We are closed weekends and major holidays. You have access to a nurse at all times for urgent questions. Please call the main number to the clinic Dept: [phone] and follow the prompts.   For any non-urgent questions, you may also contact your provider using MyChart. We now offer e-Visits for anyone 18 and older to request care online for non-urgent symptoms. For details visit [email or web address].   Also download the MyChart app! Go to the app store, search "MyChart", open the app, select , and log in with your MyChart username and password.  Bevacizumab Injection What is this medication? BEVACIZUMAB (be va SIZ yoo mab) treats some types of cancer. It works by blocking a protein that causes cancer cells to grow and multiply. This helps to slow or stop the spread of cancer cells. It is a monoclonal antibody. This medicine may be used for other purposes; ask your health care provider or pharmacist if you have questions. COMMON BRAND NAME(S): Alymsys, Avastin, MVASI, Zirabev What should I tell my care team before   I take this medication? They need to know if you have any of these conditions: Blood clots Coughing up blood Having or recent surgery Heart failure High blood pressure History of a connection between 2 or more body parts that do not usually connect (fistula) History of a tear in your stomach or intestines Protein in your urine An  unusual or allergic reaction to bevacizumab, other medications, foods, dyes, or preservatives Pregnant or trying to get pregnant Breast-feeding How should I use this medication? This medication is injected into a vein. It is given by your care team in a hospital or clinic setting. Talk to your care team the use of this medication in children. Special care may be needed. Overdosage: If you think you have taken too much of this medicine contact a poison control center or emergency room at once. NOTE: This medicine is only for you. Do not share this medicine with others. What if I miss a dose? Keep appointments for follow-up doses. It is important not to miss your dose. Call your care team if you are unable to keep an appointment. What may interact with this medication? Interactions are not expected. This list may not describe all possible interactions. Give your health care provider a list of all the medicines, herbs, non-prescription drugs, or dietary supplements you use. Also tell them if you smoke, drink alcohol, or use illegal drugs. Some items may interact with your medicine. What should I watch for while using this medication? Your condition will be monitored carefully while you are receiving this medication. You may need blood work while taking this medication. This medication may make you feel generally unwell. This is not uncommon as chemotherapy can affect healthy cells as well as cancer cells. Report any side effects. Continue your course of treatment even though you feel ill unless your care team tells you to stop. This medication may increase your risk to bruise or bleed. Call your care team if you notice any unusual bleeding. Before having surgery, talk to your care team to make sure it is ok. This medication can increase the risk of poor healing of your surgical site or wound. You will need to stop this medication for 28 days before surgery. After surgery, wait at least 28 days before  restarting this medication. Make sure the surgical site or wound is healed enough before restarting this medication. Talk to your care team if questions. Talk to your care team if you may be pregnant. Serious birth defects can occur if you take this medication during pregnancy and for 6 months after the last dose. Contraception is recommended while taking this medication and for 6 months after the last dose. Your care team can help you find the option that works for you. Do not breastfeed while taking this medication and for 6 months after the last dose. This medication can cause infertility. Talk to your care team if you are concerned about your fertility. What side effects may I notice from receiving this medication? Side effects that you should report to your care team as soon as possible: Allergic reactions--skin rash, itching, hives, swelling of the face, lips, tongue, or throat Bleeding--bloody or black, tar-like stools, vomiting blood or brown material that looks like coffee grounds, red or dark brown urine, small red or purple spots on skin, unusual bruising or bleeding Blood clot--pain, swelling, or warmth in the leg, shortness of breath, chest pain Heart attack--pain or tightness in the chest, shoulders, arms, or jaw, nausea, shortness of breath,   cold or clammy skin, feeling faint or lightheaded Heart failure--shortness of breath, swelling of the ankles, feet, or hands, sudden weight gain, unusual weakness or fatigue Increase in blood pressure Infection--fever, chills, cough, sore throat, wounds that don't heal, pain or trouble when passing urine, general feeling of discomfort or being unwell Infusion reactions--chest pain, shortness of breath or trouble breathing, feeling faint or lightheaded Kidney injury--decrease in the amount of urine, swelling of the ankles, hands, or feet Stomach pain that is severe, does not go away, or gets worse Stroke--sudden numbness or weakness of the face,  arm, or leg, trouble speaking, confusion, trouble walking, loss of balance or coordination, dizziness, severe headache, change in vision Sudden and severe headache, confusion, change in vision, seizures, which may be signs of posterior reversible encephalopathy syndrome (PRES) Side effects that usually do not require medical attention (report to your care team if they continue or are bothersome): Back pain Change in taste Diarrhea Dry skin Increased tears Nosebleed This list may not describe all possible side effects. Call your doctor for medical advice about side effects. You may report side effects to FDA at 1-800-FDA-1088. Where should I keep my medication? This medication is given in a hospital or clinic. It will not be stored at home. NOTE: This sheet is a summary. It may not cover all possible information. If you have questions about this medicine, talk to your doctor, pharmacist, or health care provider.  2023 Elsevier/Gold Standard (2021-05-03 00:00:00)  Hypokalemia Hypokalemia means that the amount of potassium in the blood is lower than normal. Potassium is a mineral (electrolyte) that helps regulate the amount of fluid in the body. It also stimulates muscle tightening (contraction) and helps nerves work properly. Normally, most of the body's potassium is inside cells, and only a very small amount is in the blood. Because the amount in the blood is so small, minor changes to potassium levels in the blood can be life-threatening. What are the causes? This condition may be caused by: Antibiotic medicine. Diarrhea or vomiting. Taking too much of a medicine that helps you have a bowel movement (laxative) can cause diarrhea and lead to hypokalemia. Chronic kidney disease (CKD). Medicines that help the body get rid of excess fluid (diuretics). Eating disorders, such as anorexia or bulimia. Low magnesium levels in the body. Sweating a lot. What are the signs or symptoms? Symptoms of  this condition include: Weakness. Constipation. Fatigue. Muscle cramps. Mental confusion. Skipped heartbeats or irregular heartbeat (palpitations). Tingling or numbness. How is this diagnosed? This condition is diagnosed with a blood test. How is this treated? This condition may be treated by: Taking potassium supplements. Adjusting the medicines that you take. Eating more foods that contain a lot of potassium. If your potassium level is very low, you may need to get potassium through an IV and be monitored in the hospital. Follow these instructions at home: Eating and drinking  Eat a healthy diet. A healthy diet includes fresh fruits and vegetables, whole grains, healthy fats, and lean proteins. If told, eat more foods that contain a lot of potassium. These include: Nuts, such as peanuts and pistachios. Seeds, such as sunflower seeds and pumpkin seeds. Peas, lentils, and lima beans. Whole grain and bran cereals and breads. Fresh fruits and vegetables, such as apricots, avocado, bananas, cantaloupe, kiwi, oranges, tomatoes, asparagus, and potatoes. Juices, such as orange, tomato, and prune. Lean meats, including fish. Milk and milk products, such as yogurt. General instructions Take over-the-counter and prescription medicines only as   told by your health care provider. This includes vitamins, natural food products, and supplements. Keep all follow-up visits. This is important. Contact a health care provider if: You have weakness that gets worse. You feel your heart pounding or racing. You vomit. You have diarrhea. You have diabetes and you have trouble keeping your blood sugar in your target range. Get help right away if: You have chest pain. You have shortness of breath. You have vomiting or diarrhea that lasts for more than 2 days. You faint. These symptoms may be an emergency. Get help right away. Call 911. Do not wait to see if the symptoms will go away. Do not drive  yourself to the hospital. Summary Hypokalemia means that the amount of potassium in the blood is lower than normal. This condition is diagnosed with a blood test. Hypokalemia may be treated by taking potassium supplements, adjusting the medicines that you take, or eating more foods that are high in potassium. If your potassium level is very low, you may need to get potassium through an IV and be monitored in the hospital. This information is not intended to replace advice given to you by your health care provider. Make sure you discuss any questions you have with your health care provider. Document Revised: 09/13/2020 Document Reviewed: 09/13/2020 Elsevier Patient Education  2023 [employer] 

## 2022-02-18 ENCOUNTER — Other Ambulatory Visit: Payer: Self-pay

## 2022-02-18 ENCOUNTER — Ambulatory Visit: Payer: 59 | Admitting: Nurse Practitioner

## 2022-02-23 ENCOUNTER — Other Ambulatory Visit: Payer: Self-pay

## 2022-02-25 ENCOUNTER — Other Ambulatory Visit (HOSPITAL_COMMUNITY): Payer: Self-pay

## 2022-02-25 ENCOUNTER — Other Ambulatory Visit: Payer: Self-pay | Admitting: Oncology

## 2022-02-25 DIAGNOSIS — C20 Malignant neoplasm of rectum: Secondary | ICD-10-CM

## 2022-02-25 MED ORDER — LONSURF 20-8.19 MG PO TABS
ORAL_TABLET | ORAL | 0 refills | Status: DC
  Filled 2022-02-25: qty 50, 28d supply, fill #0

## 2022-02-26 ENCOUNTER — Other Ambulatory Visit: Payer: Self-pay

## 2022-02-27 ENCOUNTER — Ambulatory Visit: Payer: 59 | Admitting: Nurse Practitioner

## 2022-02-27 NOTE — Progress Notes (Signed)
No show for appt. 

## 2022-02-28 ENCOUNTER — Other Ambulatory Visit: Payer: 59

## 2022-02-28 ENCOUNTER — Ambulatory Visit: Payer: 59

## 2022-02-28 ENCOUNTER — Ambulatory Visit: Payer: 59 | Admitting: Oncology

## 2022-03-02 ENCOUNTER — Other Ambulatory Visit: Payer: Self-pay

## 2022-03-02 ENCOUNTER — Other Ambulatory Visit: Payer: Self-pay | Admitting: Oncology

## 2022-03-03 ENCOUNTER — Inpatient Hospital Stay: Payer: 59

## 2022-03-03 ENCOUNTER — Encounter: Payer: Self-pay | Admitting: Nurse Practitioner

## 2022-03-03 ENCOUNTER — Other Ambulatory Visit (HOSPITAL_COMMUNITY): Payer: Self-pay

## 2022-03-03 ENCOUNTER — Inpatient Hospital Stay (HOSPITAL_BASED_OUTPATIENT_CLINIC_OR_DEPARTMENT_OTHER): Payer: 59 | Admitting: Nurse Practitioner

## 2022-03-03 VITALS — BP 144/90 | HR 79

## 2022-03-03 DIAGNOSIS — C786 Secondary malignant neoplasm of retroperitoneum and peritoneum: Secondary | ICD-10-CM

## 2022-03-03 DIAGNOSIS — C20 Malignant neoplasm of rectum: Secondary | ICD-10-CM

## 2022-03-03 DIAGNOSIS — Z79899 Other long term (current) drug therapy: Secondary | ICD-10-CM | POA: Diagnosis not present

## 2022-03-03 DIAGNOSIS — C7989 Secondary malignant neoplasm of other specified sites: Secondary | ICD-10-CM | POA: Diagnosis not present

## 2022-03-03 DIAGNOSIS — C187 Malignant neoplasm of sigmoid colon: Secondary | ICD-10-CM | POA: Diagnosis not present

## 2022-03-03 DIAGNOSIS — Z5112 Encounter for antineoplastic immunotherapy: Secondary | ICD-10-CM | POA: Diagnosis not present

## 2022-03-03 LAB — CBC WITH DIFFERENTIAL (CANCER CENTER ONLY)
Abs Immature Granulocytes: 0.01 10*3/uL (ref 0.00–0.07)
Basophils Absolute: 0.1 10*3/uL (ref 0.0–0.1)
Basophils Relative: 1 %
Eosinophils Absolute: 1.4 10*3/uL — ABNORMAL HIGH (ref 0.0–0.5)
Eosinophils Relative: 20 %
HCT: 42.4 % (ref 36.0–46.0)
Hemoglobin: 14.2 g/dL (ref 12.0–15.0)
Immature Granulocytes: 0 %
Lymphocytes Relative: 43 %
Lymphs Abs: 3 10*3/uL (ref 0.7–4.0)
MCH: 32.1 pg (ref 26.0–34.0)
MCHC: 33.5 g/dL (ref 30.0–36.0)
MCV: 95.7 fL (ref 80.0–100.0)
Monocytes Absolute: 0.5 10*3/uL (ref 0.1–1.0)
Monocytes Relative: 7 %
Neutro Abs: 2 10*3/uL (ref 1.7–7.7)
Neutrophils Relative %: 29 %
Platelet Count: 279 10*3/uL (ref 150–400)
RBC: 4.43 MIL/uL (ref 3.87–5.11)
RDW: 13.2 % (ref 11.5–15.5)
WBC Count: 6.9 10*3/uL (ref 4.0–10.5)
nRBC: 0 % (ref 0.0–0.2)

## 2022-03-03 LAB — CMP (CANCER CENTER ONLY)
ALT: 26 U/L (ref 0–44)
AST: 24 U/L (ref 15–41)
Albumin: 4.6 g/dL (ref 3.5–5.0)
Alkaline Phosphatase: 114 U/L (ref 38–126)
Anion gap: 11 (ref 5–15)
BUN: 13 mg/dL (ref 6–20)
CO2: 28 mmol/L (ref 22–32)
Calcium: 9.7 mg/dL (ref 8.9–10.3)
Chloride: 104 mmol/L (ref 98–111)
Creatinine: 0.67 mg/dL (ref 0.44–1.00)
GFR, Estimated: >60 mL/min (ref >60)
Glucose, Bld: 78 mg/dL (ref 70–99)
Potassium: 3.3 mmol/L — ABNORMAL LOW (ref 3.5–5.1)
Sodium: 143 mmol/L (ref 135–145)
Total Bilirubin: 0.3 mg/dL (ref 0.3–1.2)
Total Protein: 7.5 g/dL (ref 6.5–8.1)

## 2022-03-03 LAB — TOTAL PROTEIN, URINE DIPSTICK

## 2022-03-03 LAB — CEA (ACCESS): CEA (CHCC): 10.95 ng/mL — ABNORMAL HIGH (ref 0.00–5.00)

## 2022-03-03 MED ORDER — SODIUM CHLORIDE 0.9 % IV SOLN: Freq: Once | INTRAVENOUS | Status: AC

## 2022-03-03 MED ORDER — SODIUM CHLORIDE 0.9 % IV SOLN
5.0000 mg/kg | Freq: Once | INTRAVENOUS | Status: AC
  Administered 2022-03-03: 350 mg via INTRAVENOUS
  Filled 2022-03-03: qty 14

## 2022-03-03 NOTE — Patient Instructions (Signed)
Per Dr. Benay Spice: she needs to take 40 meq of her liquid when she gets home and start taking it consistently every day.    [place]   Discharge Instructions: Thank you for choosing [facility] to provide your oncology and hematology care.   If you have a lab appointment with the [facility], please go directly to the [facility] and check in at the registration area.   Wear comfortable clothing and clothing appropriate for easy access to any Portacath or PICC line.   We strive to give you quality time with your provider. You may need to reschedule your appointment if you arrive late (15 or more minutes).  Arriving late affects you and other patients whose appointments are after yours.  Also, if you miss three or more appointments without notifying the office, you may be dismissed from the clinic at the provider's discretion.      For prescription refill requests, have your pharmacy contact our office and allow 72 hours for refills to be completed.    Today you received the following chemotherapy and/or immunotherapy agents Avastin.      To help prevent nausea and vomiting after your treatment, we encourage you to take your nausea medication as directed.  BELOW ARE SYMPTOMS THAT SHOULD BE REPORTED IMMEDIATELY: *FEVER GREATER THAN 100.4 F (38 C) OR HIGHER *CHILLS OR SWEATING *NAUSEA AND VOMITING THAT IS NOT CONTROLLED WITH YOUR NAUSEA MEDICATION *UNUSUAL SHORTNESS OF BREATH *UNUSUAL BRUISING OR BLEEDING *URINARY PROBLEMS (pain or burning when urinating, or frequent urination) *BOWEL PROBLEMS (unusual diarrhea, constipation, pain near the anus) TENDERNESS IN MOUTH AND THROAT WITH OR WITHOUT PRESENCE OF ULCERS (sore throat, sores in mouth, or a toothache) UNUSUAL RASH, SWELLING OR PAIN  UNUSUAL VAGINAL DISCHARGE OR ITCHING   Items with * indicate a potential emergency and should be followed up as soon as possible or go to the  Emergency Department if any problems should occur.  Please show the CHEMOTHERAPY ALERT CARD or IMMUNOTHERAPY ALERT CARD at check-in to the Emergency Department and triage nurse.  Should you have questions after your visit or need to cancel or reschedule your appointment, please contact [facility]  Dept: [phone]  and follow the prompts.  Office hours are 8:00 a.m. to 4:30 p.m. Monday - Friday. Please note that voicemails left after 4:00 p.m. may not be returned until the following business day.  We are closed weekends and major holidays. You have access to a nurse at all times for urgent questions. Please call the main number to the clinic Dept: [phone] and follow the prompts.   For any non-urgent questions, you may also contact your provider using MyChart. We now offer e-Visits for anyone 30 and older to request care online for non-urgent symptoms. For details visit [email or web address].   Also download the MyChart app! Go to the app store, search "MyChart", open the app, select [facility], and log in with your MyChart username and password.  Bevacizumab Injection What is this medication? BEVACIZUMAB (be va SIZ yoo mab) treats some types of cancer. It works by blocking a protein that causes cancer cells to grow and multiply. This helps to slow or stop the spread of cancer cells. It is a monoclonal antibody. This medicine may be used for other purposes; ask your health care provider or pharmacist if you have questions. COMMON BRAND NAME(S): Alymsys, Avastin, MVASI, Zirabev What should I tell my care team before  I take this medication? They need to know if you have any of these conditions: Blood clots Coughing up blood Having or recent surgery Heart failure High blood pressure History of a connection between 2 or more body parts that do not usually connect (fistula) History of a tear in your stomach or intestines Protein in your urine An  unusual or allergic reaction to bevacizumab, other medications, foods, dyes, or preservatives Pregnant or trying to get pregnant Breast-feeding How should I use this medication? This medication is injected into a vein. It is given by your care team in a hospital or clinic setting. Talk to your care team the use of this medication in children. Special care may be needed. Overdosage: If you think you have taken too much of this medicine contact a poison control center or emergency room at once. NOTE: This medicine is only for you. Do not share this medicine with others. What if I miss a dose? Keep appointments for follow-up doses. It is important not to miss your dose. Call your care team if you are unable to keep an appointment. What may interact with this medication? Interactions are not expected. This list may not describe all possible interactions. Give your health care provider a list of all the medicines, herbs, non-prescription drugs, or dietary supplements you use. Also tell them if you smoke, drink alcohol, or use illegal drugs. Some items may interact with your medicine. What should I watch for while using this medication? Your condition will be monitored carefully while you are receiving this medication. You may need blood work while taking this medication. This medication may make you feel generally unwell. This is not uncommon as chemotherapy can affect healthy cells as well as cancer cells. Report any side effects. Continue your course of treatment even though you feel ill unless your care team tells you to stop. This medication may increase your risk to bruise or bleed. Call your care team if you notice any unusual bleeding. Before having surgery, talk to your care team to make sure it is ok. This medication can increase the risk of poor healing of your surgical site or wound. You will need to stop this medication for 28 days before surgery. After surgery, wait at least 28 days before  restarting this medication. Make sure the surgical site or wound is healed enough before restarting this medication. Talk to your care team if questions. Talk to your care team if you may be pregnant. Serious birth defects can occur if you take this medication during pregnancy and for 6 months after the last dose. Contraception is recommended while taking this medication and for 6 months after the last dose. Your care team can help you find the option that works for you. Do not breastfeed while taking this medication and for 6 months after the last dose. This medication can cause infertility. Talk to your care team if you are concerned about your fertility. What side effects may I notice from receiving this medication? Side effects that you should report to your care team as soon as possible: Allergic reactions--skin rash, itching, hives, swelling of the face, lips, tongue, or throat Bleeding--bloody or black, tar-like stools, vomiting blood or brown material that looks like coffee grounds, red or dark brown urine, small red or purple spots on skin, unusual bruising or bleeding Blood clot--pain, swelling, or warmth in the leg, shortness of breath, chest pain Heart attack--pain or tightness in the chest, shoulders, arms, or jaw, nausea, shortness of breath,  cold or clammy skin, feeling faint or lightheaded Heart failure--shortness of breath, swelling of the ankles, feet, or hands, sudden weight gain, unusual weakness or fatigue Increase in blood pressure Infection--fever, chills, cough, sore throat, wounds that don't heal, pain or trouble when passing urine, general feeling of discomfort or being unwell Infusion reactions--chest pain, shortness of breath or trouble breathing, feeling faint or lightheaded Kidney injury--decrease in the amount of urine, swelling of the ankles, hands, or feet Stomach pain that is severe, does not go away, or gets worse Stroke--sudden numbness or weakness of the face,  arm, or leg, trouble speaking, confusion, trouble walking, loss of balance or coordination, dizziness, severe headache, change in vision Sudden and severe headache, confusion, change in vision, seizures, which may be signs of posterior reversible encephalopathy syndrome (PRES) Side effects that usually do not require medical attention (report to your care team if they continue or are bothersome): Back pain Change in taste Diarrhea Dry skin Increased tears Nosebleed This list may not describe all possible side effects. Call your doctor for medical advice about side effects. You may report side effects to FDA at 1-800-FDA-1088. Where should I keep my medication? This medication is given in a hospital or clinic. It will not be stored at home. NOTE: This sheet is a summary. It may not cover all possible information. If you have questions about this medicine, talk to your doctor, pharmacist, or health care provider.  2023 Elsevier/Gold Standard (2021-05-03 00:00:00)  Hypokalemia Hypokalemia means that the amount of potassium in the blood is lower than normal. Potassium is a mineral (electrolyte) that helps regulate the amount of fluid in the body. It also stimulates muscle tightening (contraction) and helps nerves work properly. Normally, most of the body's potassium is inside cells, and only a very small amount is in the blood. Because the amount in the blood is so small, minor changes to potassium levels in the blood can be life-threatening. What are the causes? This condition may be caused by: Antibiotic medicine. Diarrhea or vomiting. Taking too much of a medicine that helps you have a bowel movement (laxative) can cause diarrhea and lead to hypokalemia. Chronic kidney disease (CKD). Medicines that help the body get rid of excess fluid (diuretics). Eating disorders, such as anorexia or bulimia. Low magnesium levels in the body. Sweating a lot. What are the signs or symptoms? Symptoms of  this condition include: Weakness. Constipation. Fatigue. Muscle cramps. Mental confusion. Skipped heartbeats or irregular heartbeat (palpitations). Tingling or numbness. How is this diagnosed? This condition is diagnosed with a blood test. How is this treated? This condition may be treated by: Taking potassium supplements. Adjusting the medicines that you take. Eating more foods that contain a lot of potassium. If your potassium level is very low, you may need to get potassium through an IV and be monitored in the hospital. Follow these instructions at home: Eating and drinking  Eat a healthy diet. A healthy diet includes fresh fruits and vegetables, whole grains, healthy fats, and lean proteins. If told, eat more foods that contain a lot of potassium. These include: Nuts, such as peanuts and pistachios. Seeds, such as sunflower seeds and pumpkin seeds. Peas, lentils, and lima beans. Whole grain and bran cereals and breads. Fresh fruits and vegetables, such as apricots, avocado, bananas, cantaloupe, kiwi, oranges, tomatoes, asparagus, and potatoes. Juices, such as orange, tomato, and prune. Lean meats, including fish. Milk and milk products, such as yogurt. General instructions Take over-the-counter and prescription medicines only as  told by your health care provider. This includes vitamins, natural food products, and supplements. Keep all follow-up visits. This is important. Contact a health care provider if: You have weakness that gets worse. You feel your heart pounding or racing. You vomit. You have diarrhea. You have diabetes and you have trouble keeping your blood sugar in your target range. Get help right away if: You have chest pain. You have shortness of breath. You have vomiting or diarrhea that lasts for more than 2 days. You faint. These symptoms may be an emergency. Get help right away. Call 911. Do not wait to see if the symptoms will go away. Do not drive  yourself to the hospital. Summary Hypokalemia means that the amount of potassium in the blood is lower than normal. This condition is diagnosed with a blood test. Hypokalemia may be treated by taking potassium supplements, adjusting the medicines that you take, or eating more foods that are high in potassium. If your potassium level is very low, you may need to get potassium through an IV and be monitored in the hospital. This information is not intended to replace advice given to you by your health care provider. Make sure you discuss any questions you have with your health care provider. Document Revised: 09/13/2020 Document Reviewed: 09/13/2020 Elsevier Patient Education  [place].

## 2022-03-03 NOTE — Progress Notes (Signed)
[facility] OFFICE PROGRESS NOTE   Diagnosis: Colon cancer  INTERVAL HISTORY:   April [name] returns as scheduled.  She completed cycle 5 Lonsurf beginning 02/05/2022 or 02/06/2022.  She continues Avastin every 2 weeks.  She feels well.  No nausea or vomiting.  No mouth sores.  No diarrhea.  She denies bleeding.  No rash.  No shortness of breath or chest pain.  She has an occasional sharp pain in the region of the rectum.  Objective:  Vital signs in last 24 hours:  Blood pressure (!) 145/82, pulse 80, temperature 98.1 F (36.7 C), temperature source Oral, resp. rate 18, height 5' 4"$  (1.626 m), weight 160 lb (72.6 kg), last menstrual period 01/07/2006, SpO2 100 %.    HEENT: No thrush or ulcers. Resp: Lungs clear bilaterally. Cardio: Regular rate and rhythm. GI: Abdomen soft and nontender.  No hepatosplenomegaly. Vascular: No leg edema. Neuro: Alert and oriented. Skin: No rash.    Lab Results:  Lab Results  Component Value Date   WBC 6.9 03/03/2022   HGB 14.2 03/03/2022   HCT 42.4 03/03/2022   MCV 95.7 03/03/2022   PLT 279 03/03/2022   NEUTROABS 2.0 03/03/2022    Imaging:  No results found.  Medications: I have reviewed the patient's current medications.  Assessment/Plan: Sigmoid colon cancer, stage IV (pT4a,pN2b,M1c) Colonoscopy 03/24/2019-3 rectal polyps-hyperplastic polyps, distal colon biopsy-at least intramucosal adenocarcinoma, completely obstructing mass in the distal sigmoid colon, could not be traversed, intact mismatch repair protein expression 03/24/2019-CEA 56.1 03/29/2019 CT abdomen/pelvis-circumferential thickening involving the entire mid and distal sigmoid colon to the level of the rectosigmoid junction, solid/cystic mass in the left ovary, bilateral nephrolithiasis Robotic assisted low anterior resection, mesenteric lymphadenectomy, bilateral salpingo-oophorectomy 04/08/2019 Pathology ([facility] review of outside pathology) metastatic  adenocarcinoma involving the peritoneum overlying the round ligament, serosa of the urinary bladder, serosa of the right ureter a sacral area, pelvic peritoneum, and left ovary.  Omental biopsy with focal mucin pools with no carcinoma cells identified, right hemidiaphragm biopsy involved by metastatic adenocarcinoma, invasive adenocarcinoma the sigmoid colon, moderately differentiated, T4a, perineural and vascular invasion present, 7/22 lymph nodes, multiple tumor deposits, resection margins negative Negative for PD-L1, low probability of MSI-high, HER-2 negative, negative for BRAF, NRAS and KRAS alterations CTs 05/19/2019-no evidence of metastatic disease, findings suspicious for colitis of the transverse and ascending colon, small amount of ascites in the cul-de-sac, bilateral renal calculi Cycle 1 FOLFIRI 05/24/2019, bevacizumab added with cycle 2 Cycle 4 FOLFIRI/bevacizumab 07/05/2019 Cycle 5 FOLFIRI 07/27/2019, bevacizumab held secondary to hypertension Cycle 6 FOLFIRI 08/10/1999, bevacizumab held secondary to hypertension CTs 08/30/2019-no evidence of recurrent disease, new subsolid right lower lobe nodule felt to be inflammatory, emphysema Cycle 7 FOLFIRI 09/26/2019, bevacizumab remains on hold secondary to hypertension Cycle 8 FOLFIRI 10/17/2019, bevacizumab held secondary to hypertension, Udenyca added for neutropenia Cycle 9 FOLFIRI 11/07/2019, bevacizumab held secondary to hypertension, Udenyca Cycle 10 FOLFIRI 11/28/2019, bevacizumab held, Udenyca Cycle 11 FOLFIRI 12/26/2019, bevacizumab held, Udenyca CTs 01/25/2020-no evidence of recurrent disease, resolution of right lower lobe nodule Maintenance Xeloda beginning 02/06/2020 CTs 04/25/2020- no evidence of metastatic disease, multiple bilateral renal calculi without hydronephrosis Maintenance Xeloda continued CT abdomen/pelvis without contrast 08/10/2020-bilateral staghorn renal calculi, no evidence of metastatic disease; addendum 08/23/2020-small but  increasing omental nodules. Cycle 1 FOLFIRI/panitumumab 09/19/2020 Cycle 2 FOLFIRI/Panitumumab 10/03/2020 Cycle 3 FOLFIRI/Panitumumab 10/17/2020 Cycle 4 FOLFIRI/panitumumab 10/31/2020 Cycle 5 FOLFIRI/Panitumumab 11/14/2020 CTs 11/26/2020-stable omental metastases compared to 10/22/2020, mildly decreased from 08/10/2020.  Left lower quadrant tiny paracolic gutter  implant slightly decreased from CT 08/10/2020.  No new or progressive metastatic disease in the abdomen or pelvis. Cycle 6 FOLFIRI/Panitumumab 11/28/2020, irinotecan dose reduced, treatment schedule adjusted to every 3 weeks going forward Cycle 7 FOLFIRI/panitumumab 12/24/2020 Cycle 8 FOLFIRI/Panitumumab 01/16/2021--treatment held due to hypertension in the infusion area. Cycle 8 FOLFIRI/panitumumab 01/21/2021 Cycle 9 FOLFIRI/panitumumab 02/11/2021 Cycle 10 FOLFIRI/Panitumumab 03/04/2021 Cycle 11 FOLFIRI/Panitumumab 03/25/2021 CT abdomen/pelvis 04/10/2021-stable right omental and left posterior paracolic gutter nodules Cycle 12 FOLFIRI/panitumumab 04/15/2021 Cycle 13 FOLFIRI/panitumumab 05/06/2021 Cycle 14 FOLFIRI/Panitumumab 05/27/2021 Cycle 15 FOLFIRI/Panitumumab 06/17/2021 CT abdomen/pelvis 07/05/2021-slight enlargement of a dominant right omental implant, no new implants Patient requested a treatment break CT abdomen/pelvis 10/04/2021-mild increase in size of multiple omental soft tissue nodules, 4 mm calculus in the distal left ureter adjacent to the ureteral stent Cycle 1 Lonsurf 10/14/2021 10/28/2021 Avastin every 2 weeks Cycle 2 Lonsurf 11/11/2021 Cycle 3 Lonsurf 12/09/2021 Cycle 4 Lonsurf 01/06/2022 Avastin held 01/20/2022 due to proteinuria, 24-hour urine 43 mg CT/pelvis 01/30/2022-possible new peritoneal implant at the transverse colon, no significant change in other omental/peritoneal implants, stable chronic rectal wall thickening, status post removal of double-J left ureteral stent with no evidence of hydronephrosis or ureteral calculus,  nonobstructing bilateral renal calculi Cycle 5 Lonsurf 02/05/2022 or 02/06/2022 Avastin every 2 weeks Cycle 6 Lonsurf 03/03/2022   Hypertension G4 P3, twins Kidney stones-bilateral staghorn renal calculi on CT 08/10/2020 Infected Port-A-Cath 09/12/2019-placed on Augmentin, referred for Port-A-Cath removal; Port-A-Cath removed 09/14/2019; PICC line placed 09/14/2019; culture staph aureus.  Course of Septra completed. Neutropenia secondary to chemotherapy-Udenyca added with cycle 8 FOLFIRI Right nephrostomy tube 09/12/2020; stent placed 10/09/2020, percutaneous nephrostolithotomy treatment of right-sided kidney stones 10/09/2020, malpositioned right ureter stent replacement 10/23/2020, right ureter stent removed 10/29/2020 Percutaneous left nephrostolithotomy 10/01/2021-no renal stone identified, left ureter stent left in place Cystoscopy 10/14/2021-stent removed    Disposition: Ms. Avilez appears stable.  She has completed 5 cycles of Lonsurf, every 2-week Avastin.  There is no clinical evidence of disease progression.  CEA is stable.  She will begin cycle 6 Lonsurf today as scheduled.  Continue Avastin every 2 weeks, infusion today.  CBC and chemistry panel reviewed.  Labs adequate to proceed as above.  She will return for lab, follow-up, Avastin in 2 weeks.  She will contact the office in the interim with any problems.    Ned Card ANP/GNP-BC   03/03/2022  10:12 AM

## 2022-03-12 ENCOUNTER — Other Ambulatory Visit: Payer: Self-pay | Admitting: Oncology

## 2022-03-12 ENCOUNTER — Encounter: Payer: Self-pay | Admitting: Nurse Practitioner

## 2022-03-12 DIAGNOSIS — C20 Malignant neoplasm of rectum: Secondary | ICD-10-CM

## 2022-03-12 DIAGNOSIS — C786 Secondary malignant neoplasm of retroperitoneum and peritoneum: Secondary | ICD-10-CM

## 2022-03-14 ENCOUNTER — Ambulatory Visit: Payer: 59

## 2022-03-14 ENCOUNTER — Other Ambulatory Visit: Payer: 59

## 2022-03-14 ENCOUNTER — Ambulatory Visit: Payer: 59 | Admitting: Nurse Practitioner

## 2022-03-17 ENCOUNTER — Inpatient Hospital Stay: Payer: 59 | Admitting: Nurse Practitioner

## 2022-03-17 ENCOUNTER — Inpatient Hospital Stay: Payer: 59

## 2022-03-17 ENCOUNTER — Telehealth: Payer: Self-pay

## 2022-03-17 NOTE — Telephone Encounter (Addendum)
Patient called to report she is not coming in for treatment. Patient is having bowel movement issues, late one was on 03/11/22 then shortly after that she started vomiting and being nausea. She take Trulance and had a small bm on the 2/27.  She is not eating or drinking. She is very weak. Pain in the upper abdomen. Denies any fever, no Covid test. I advise the patient to come in to be evaluate by the provider. She states she is too weak to come  She just wants to be reschedule at the end of the week. I explain to the patient could have a bowel obstruction and needs to be evaluate. Per Lattie Haw she needs to go to the emergency room. Patient gave verbal understanding and states she will go.

## 2022-03-19 ENCOUNTER — Other Ambulatory Visit (HOSPITAL_COMMUNITY): Payer: Self-pay

## 2022-03-19 ENCOUNTER — Other Ambulatory Visit: Payer: Self-pay | Admitting: Oncology

## 2022-03-19 DIAGNOSIS — C20 Malignant neoplasm of rectum: Secondary | ICD-10-CM

## 2022-03-20 ENCOUNTER — Other Ambulatory Visit (HOSPITAL_COMMUNITY): Payer: Self-pay

## 2022-03-20 ENCOUNTER — Encounter: Payer: Self-pay | Admitting: Oncology

## 2022-03-20 ENCOUNTER — Other Ambulatory Visit: Payer: Self-pay | Admitting: Oncology

## 2022-03-20 DIAGNOSIS — C20 Malignant neoplasm of rectum: Secondary | ICD-10-CM

## 2022-03-20 NOTE — Telephone Encounter (Signed)
Due to begin 03/31/22--being seen on 3/08. Will refill after MD visit.

## 2022-03-21 ENCOUNTER — Encounter (HOSPITAL_COMMUNITY): Payer: Self-pay

## 2022-03-21 ENCOUNTER — Encounter (HOSPITAL_COMMUNITY): Payer: Self-pay | Admitting: Family Medicine

## 2022-03-21 ENCOUNTER — Encounter: Payer: Self-pay | Admitting: Nurse Practitioner

## 2022-03-21 ENCOUNTER — Inpatient Hospital Stay: Payer: 59

## 2022-03-21 ENCOUNTER — Other Ambulatory Visit: Payer: Self-pay

## 2022-03-21 ENCOUNTER — Inpatient Hospital Stay (HOSPITAL_COMMUNITY)
Admission: RE | Admit: 2022-03-21 | Discharge: 2022-03-27 | DRG: 444 | Disposition: A | Discharge status: Home / Self Care | Payer: 59 | Source: Ambulatory Visit | Attending: Family Medicine | Admitting: Family Medicine

## 2022-03-21 ENCOUNTER — Ambulatory Visit (HOSPITAL_BASED_OUTPATIENT_CLINIC_OR_DEPARTMENT_OTHER)
Admission: RE | Admit: 2022-03-21 | Discharge: 2022-03-21 | Disposition: A | Discharge status: Home / Self Care | Payer: 59 | Source: Ambulatory Visit | Attending: Nurse Practitioner | Admitting: Nurse Practitioner

## 2022-03-21 ENCOUNTER — Inpatient Hospital Stay: Payer: 59 | Attending: Oncology

## 2022-03-21 ENCOUNTER — Inpatient Hospital Stay (HOSPITAL_BASED_OUTPATIENT_CLINIC_OR_DEPARTMENT_OTHER): Payer: 59 | Admitting: Nurse Practitioner

## 2022-03-21 VITALS — BP 134/88 | HR 100 | Temp 98.1°F | Resp 18 | Ht 64.0 in | Wt 154.0 lb

## 2022-03-21 DIAGNOSIS — C787 Secondary malignant neoplasm of liver and intrahepatic bile duct: Secondary | ICD-10-CM | POA: Diagnosis not present

## 2022-03-21 DIAGNOSIS — R7989 Other specified abnormal findings of blood chemistry: Secondary | ICD-10-CM

## 2022-03-21 DIAGNOSIS — Z87442 Personal history of urinary calculi: Secondary | ICD-10-CM | POA: Insufficient documentation

## 2022-03-21 DIAGNOSIS — Z79899 Other long term (current) drug therapy: Secondary | ICD-10-CM

## 2022-03-21 DIAGNOSIS — C189 Malignant neoplasm of colon, unspecified: Secondary | ICD-10-CM

## 2022-03-21 DIAGNOSIS — K5900 Constipation, unspecified: Secondary | ICD-10-CM | POA: Insufficient documentation

## 2022-03-21 DIAGNOSIS — R1010 Upper abdominal pain, unspecified: Secondary | ICD-10-CM | POA: Insufficient documentation

## 2022-03-21 DIAGNOSIS — Z6826 Body mass index (BMI) 26.0-26.9, adult: Secondary | ICD-10-CM

## 2022-03-21 DIAGNOSIS — K831 Obstruction of bile duct: Principal | ICD-10-CM

## 2022-03-21 DIAGNOSIS — K838 Other specified diseases of biliary tract: Secondary | ICD-10-CM | POA: Diagnosis not present

## 2022-03-21 DIAGNOSIS — I10 Essential (primary) hypertension: Secondary | ICD-10-CM | POA: Insufficient documentation

## 2022-03-21 DIAGNOSIS — Z833 Family history of diabetes mellitus: Secondary | ICD-10-CM

## 2022-03-21 DIAGNOSIS — C801 Malignant (primary) neoplasm, unspecified: Secondary | ICD-10-CM | POA: Diagnosis not present

## 2022-03-21 DIAGNOSIS — C19 Malignant neoplasm of rectosigmoid junction: Secondary | ICD-10-CM | POA: Diagnosis present

## 2022-03-21 DIAGNOSIS — C20 Malignant neoplasm of rectum: Secondary | ICD-10-CM

## 2022-03-21 DIAGNOSIS — Z9079 Acquired absence of other genital organ(s): Secondary | ICD-10-CM | POA: Insufficient documentation

## 2022-03-21 DIAGNOSIS — F17210 Nicotine dependence, cigarettes, uncomplicated: Secondary | ICD-10-CM | POA: Diagnosis present

## 2022-03-21 DIAGNOSIS — E871 Hypo-osmolality and hyponatremia: Secondary | ICD-10-CM | POA: Diagnosis present

## 2022-03-21 DIAGNOSIS — E876 Hypokalemia: Secondary | ICD-10-CM | POA: Diagnosis present

## 2022-03-21 DIAGNOSIS — G8929 Other chronic pain: Secondary | ICD-10-CM | POA: Diagnosis not present

## 2022-03-21 DIAGNOSIS — Z5982 Transportation insecurity: Secondary | ICD-10-CM | POA: Diagnosis not present

## 2022-03-21 DIAGNOSIS — C786 Secondary malignant neoplasm of retroperitoneum and peritoneum: Secondary | ICD-10-CM

## 2022-03-21 DIAGNOSIS — Z5941 Food insecurity: Secondary | ICD-10-CM | POA: Diagnosis not present

## 2022-03-21 DIAGNOSIS — K209 Esophagitis, unspecified without bleeding: Secondary | ICD-10-CM | POA: Diagnosis present

## 2022-03-21 DIAGNOSIS — Z8249 Family history of ischemic heart disease and other diseases of the circulatory system: Secondary | ICD-10-CM

## 2022-03-21 DIAGNOSIS — K5909 Other constipation: Secondary | ICD-10-CM | POA: Diagnosis not present

## 2022-03-21 DIAGNOSIS — C187 Malignant neoplasm of sigmoid colon: Secondary | ICD-10-CM | POA: Insufficient documentation

## 2022-03-21 DIAGNOSIS — K449 Diaphragmatic hernia without obstruction or gangrene: Secondary | ICD-10-CM | POA: Diagnosis not present

## 2022-03-21 DIAGNOSIS — R935 Abnormal findings on diagnostic imaging of other abdominal regions, including retroperitoneum: Secondary | ICD-10-CM | POA: Diagnosis not present

## 2022-03-21 DIAGNOSIS — Z85038 Personal history of other malignant neoplasm of large intestine: Secondary | ICD-10-CM | POA: Diagnosis not present

## 2022-03-21 DIAGNOSIS — R1013 Epigastric pain: Secondary | ICD-10-CM | POA: Diagnosis not present

## 2022-03-21 DIAGNOSIS — E43 Unspecified severe protein-calorie malnutrition: Secondary | ICD-10-CM | POA: Diagnosis not present

## 2022-03-21 DIAGNOSIS — C785 Secondary malignant neoplasm of large intestine and rectum: Secondary | ICD-10-CM | POA: Diagnosis not present

## 2022-03-21 DIAGNOSIS — R109 Unspecified abdominal pain: Secondary | ICD-10-CM | POA: Diagnosis not present

## 2022-03-21 DIAGNOSIS — Z888 Allergy status to other drugs, medicaments and biological substances status: Secondary | ICD-10-CM | POA: Diagnosis not present

## 2022-03-21 DIAGNOSIS — Z823 Family history of stroke: Secondary | ICD-10-CM

## 2022-03-21 DIAGNOSIS — K3189 Other diseases of stomach and duodenum: Secondary | ICD-10-CM | POA: Diagnosis not present

## 2022-03-21 DIAGNOSIS — N200 Calculus of kidney: Secondary | ICD-10-CM

## 2022-03-21 DIAGNOSIS — N289 Disorder of kidney and ureter, unspecified: Secondary | ICD-10-CM | POA: Diagnosis not present

## 2022-03-21 DIAGNOSIS — Z90722 Acquired absence of ovaries, bilateral: Secondary | ICD-10-CM | POA: Insufficient documentation

## 2022-03-21 DIAGNOSIS — R079 Chest pain, unspecified: Secondary | ICD-10-CM | POA: Diagnosis not present

## 2022-03-21 LAB — CMP (CANCER CENTER ONLY)
ALT: 441 U/L (ref 0–44)
AST: 296 U/L (ref 15–41)
Albumin: 4.1 g/dL (ref 3.5–5.0)
Alkaline Phosphatase: 613 U/L — ABNORMAL HIGH (ref 38–126)
Anion gap: 11 (ref 5–15)
BUN: 11 mg/dL (ref 6–20)
CO2: 30 mmol/L (ref 22–32)
Calcium: 10.1 mg/dL (ref 8.9–10.3)
Chloride: 95 mmol/L — ABNORMAL LOW (ref 98–111)
Creatinine: 0.7 mg/dL (ref 0.44–1.00)
GFR, Estimated: >60 mL/min (ref >60)
Glucose, Bld: 107 mg/dL — ABNORMAL HIGH (ref 70–99)
Potassium: 3 mmol/L — ABNORMAL LOW (ref 3.5–5.1)
Sodium: 136 mmol/L (ref 135–145)
Total Bilirubin: 6.1 mg/dL (ref 0.3–1.2)
Total Protein: 8.1 g/dL (ref 6.5–8.1)

## 2022-03-21 LAB — CBC WITH DIFFERENTIAL (CANCER CENTER ONLY)
Abs Immature Granulocytes: 0.01 10*3/uL (ref 0.00–0.07)
Basophils Absolute: 0 10*3/uL (ref 0.0–0.1)
Basophils Relative: 1 %
Eosinophils Absolute: 0.5 10*3/uL (ref 0.0–0.5)
Eosinophils Relative: 8 %
HCT: 41.2 % (ref 36.0–46.0)
Hemoglobin: 14.3 g/dL (ref 12.0–15.0)
Immature Granulocytes: 0 %
Lymphocytes Relative: 31 %
Lymphs Abs: 1.8 10*3/uL (ref 0.7–4.0)
MCH: 31.8 pg (ref 26.0–34.0)
MCHC: 34.7 g/dL (ref 30.0–36.0)
MCV: 91.8 fL (ref 80.0–100.0)
Monocytes Absolute: 0.5 10*3/uL (ref 0.1–1.0)
Monocytes Relative: 8 %
Neutro Abs: 3.2 10*3/uL (ref 1.7–7.7)
Neutrophils Relative %: 52 %
Platelet Count: 226 10*3/uL (ref 150–400)
RBC: 4.49 MIL/uL (ref 3.87–5.11)
RDW: 13.9 % (ref 11.5–15.5)
WBC Count: 6 10*3/uL (ref 4.0–10.5)
nRBC: 0 % (ref 0.0–0.2)

## 2022-03-21 LAB — TOTAL PROTEIN, URINE DIPSTICK: Protein, ur: 100 mg/dL — AB

## 2022-03-21 LAB — CEA (ACCESS): CEA (CHCC): 19.65 ng/mL — ABNORMAL HIGH (ref 0.00–5.00)

## 2022-03-21 LAB — HIV ANTIBODY (ROUTINE TESTING W REFLEX): HIV Screen 4th Generation wRfx: NONREACTIVE

## 2022-03-21 MED ORDER — SODIUM CHLORIDE 0.9 % IV SOLN: INTRAVENOUS | Status: AC

## 2022-03-21 MED ORDER — ONDANSETRON HCL 4 MG/2ML IJ SOLN
4.0000 mg | Freq: Four times a day (QID) | INTRAMUSCULAR | Status: DC | PRN
  Administered 2022-03-21 – 2022-03-24 (×6): 4 mg via INTRAVENOUS
  Filled 2022-03-21 (×6): qty 2

## 2022-03-21 MED ORDER — HYDROCODONE-ACETAMINOPHEN 10-325 MG PO TABS
1.0000 | ORAL_TABLET | Freq: Four times a day (QID) | ORAL | Status: DC | PRN
  Administered 2022-03-21 – 2022-03-27 (×8): 1 via ORAL
  Filled 2022-03-21 (×8): qty 1

## 2022-03-21 MED ORDER — MORPHINE SULFATE (PF) 2 MG/ML IV SOLN
1.0000 mg | INTRAVENOUS | Status: DC | PRN
  Administered 2022-03-21 – 2022-03-22 (×2): 1 mg via INTRAVENOUS
  Filled 2022-03-21 (×3): qty 1

## 2022-03-21 MED ORDER — METOPROLOL TARTRATE 25 MG PO TABS
25.0000 mg | ORAL_TABLET | Freq: Two times a day (BID) | ORAL | Status: DC
  Administered 2022-03-21 – 2022-03-27 (×10): 25 mg via ORAL
  Filled 2022-03-21 (×12): qty 1

## 2022-03-21 MED ORDER — HYDRALAZINE HCL 10 MG PO TABS
10.0000 mg | ORAL_TABLET | Freq: Three times a day (TID) | ORAL | Status: DC
  Administered 2022-03-21 – 2022-03-26 (×12): 10 mg via ORAL
  Filled 2022-03-21 (×13): qty 1

## 2022-03-21 MED ORDER — HYDROCHLOROTHIAZIDE 25 MG PO TABS
25.0000 mg | ORAL_TABLET | Freq: Every day | ORAL | Status: DC
  Administered 2022-03-22 – 2022-03-25 (×3): 25 mg via ORAL
  Filled 2022-03-21 (×4): qty 1

## 2022-03-21 MED ORDER — POTASSIUM CHLORIDE 10 MEQ/100ML IV SOLN
10.0000 meq | INTRAVENOUS | Status: AC
  Administered 2022-03-21 (×2): 10 meq via INTRAVENOUS
  Filled 2022-03-21 (×2): qty 100

## 2022-03-21 MED ORDER — AMLODIPINE BESYLATE 10 MG PO TABS
10.0000 mg | ORAL_TABLET | Freq: Every day | ORAL | Status: DC
  Administered 2022-03-22 – 2022-03-27 (×5): 10 mg via ORAL
  Filled 2022-03-21 (×5): qty 1

## 2022-03-21 MED ORDER — ENOXAPARIN SODIUM 40 MG/0.4ML IJ SOSY
40.0000 mg | PREFILLED_SYRINGE | INTRAMUSCULAR | Status: AC
  Administered 2022-03-21: 40 mg via SUBCUTANEOUS
  Filled 2022-03-21 (×2): qty 0.4

## 2022-03-21 NOTE — Progress Notes (Signed)
Patient seen by Ned Card NP today  Vitals are within treatment parameters.  Labs reviewed by Ned Card NP and are not all within treatment parameters. Bilirubin 6.1 , AST 296, and ALT 441.  Per physician team, patient will not be receiving treatment today.

## 2022-03-21 NOTE — Assessment & Plan Note (Signed)
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 Benay Spice who will evaluate her on Monday but can consult oncology PRN over the weekend

## 2022-03-21 NOTE — H&P (Signed)
History and Physical    Patient: April [name] [id number] DOB: 1975/08/18 DOA: 03/21/2022 DOS: the patient was seen and examined on 03/21/2022 PCP: Orma Render, NP  Patient coming from:  [facility]  Chief Complaint: No chief complaint on file.  HPI: April [name] is a 47 y.o. 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 Benay Spice), hypertension, recurrent bouts of nephrolithiasis with multiple urologic interventions at [facility] urology who presents as a direct admission from [facility] for abnormal LFTs and hyperbilirubinemia.   She presented today to oncology for routine follow up but had new symptoms of N/V, constipation, abdominal pain. Notably jaundiced with epigastric tenderness. .  Labs obtained revealing moderately elevated AST of 296 and ALT of 441 with total bilirubin of 6.1, all of which was normal 03/03/2022.  Dr. Benay Spice with oncology is concerned for hepatobiliary obstruction and is requesting hospitalization for intravenous fluids and workup to include CT imaging and potential GI consultation based on results.   Patient has issue with chronic constipation due to her cancer but has been worse this week. Has not been able to push out stool but notes some when she wipes. Has been having constant epigastric/upper abdominal pain worse with food. Pain is relieved when she lays in certain positions. Has been trying to drink more fluid but urine output has decreased and turning dark. No dysuria. Started to have daily nausea and vomiting since yesterday. Not able to keep food or liquids down.   On arrival to [facility], she is afebrile with active vomiting. BP elevated to 180/137.   Review of Systems: As mentioned in the history of present illness. All other systems reviewed and are negative. Past Medical  History:  Diagnosis Date   Benign essential hypertension 10/06/2017   Last Assessment & Plan:  Formatting of this note might be different from the original. Patient's blood pressure noted to be elevated at today's visit 188/98.  Patient will be provided with antihypertensives in the treatment center if needed in order to meet her Avastin parameters.  Patient recommended to follow up with her primary care physician regarding medication management.  Patient is not cur   Cancer ([place])    Colon cancer ([place])    Hypertension    Renal calculi    bilateral   Past Surgical History:  Procedure Laterality Date   CESAREAN SECTION     x2   CYSTOSCOPY WITH RETROGRADE PYELOGRAM, URETEROSCOPY AND STENT PLACEMENT Right 10/22/2020   Procedure: CYSTOSCOPY WITH RETROGRADE PYELOGRAM, URETEROSCOPY AND STENT PLACEMENT;  Surgeon: Robley Fries, MD;  Location: [facility];  Service: Urology;  Laterality: Right;   IR RADIOLOGIST EVAL & MGMT  09/16/2019   IR RADIOLOGIST EVAL & MGMT  09/23/2019   IR RADIOLOGIST EVAL & MGMT  09/20/2019   IR RADIOLOGIST EVAL & MGMT  09/30/2019   IR REMOVAL TUN ACCESS W/ PORT W/O FL MOD SED  09/14/2019   URETERAL STENT PLACEMENT     Social History:  reports that she has been smoking cigarettes. She has been smoking an average of .5 packs per day. She has never used smokeless tobacco. She reports current alcohol use. She reports that she does not currently use drugs.  Allergies  Allergen Reactions   Lisinopril Swelling    Lip swelling   Irinotecan Other (See Comments)    Stomach cramping Patient given 0.5 mL atropine  IV and was able to continue infusion.  See Progress note on 10/03/20.    Leucovorin Calcium Other (See Comments)    Stomach cramping Patient given 0.5 mL atropine IV and was able to continue infusion. See Progress note on 10/03/20.     Family History  Problem Relation Age of Onset   Hypertension Mother    Diabetes Mellitus II Father    Stroke Father    Ulcers Sister      Prior to Admission medications   Medication Sig Start Date End Date Taking? Authorizing Provider  amLODipine (NORVASC) 10 MG tablet Take 1 tablet (10 mg total) by mouth daily. 07/18/21  Yes Early, Coralee Pesa, NP  hydrALAZINE (APRESOLINE) 10 MG tablet Take 1 tablet (10 mg total) by mouth 3 (three) times daily. 12/23/21  Yes Ladell Pier, MD  hydrochlorothiazide (HYDRODIURIL) 25 MG tablet Take 1 tablet (25 mg total) by mouth daily. 02/03/22  Yes Ladell Pier, MD  HYDROcodone-acetaminophen (NORCO) 10-325 MG tablet Take 1 tablet by mouth every 6 (six) hours as needed. 02/03/22  Yes Owens Shark, NP  metoprolol tartrate (LOPRESSOR) 25 MG tablet Take 1 tablet (25 mg total) by mouth 2 (two) times daily. 07/18/21  Yes Early, Coralee Pesa, NP  Plecanatide (TRULANCE) 3 MG TABS Take by mouth. 09/30/21  Yes [provider]  potassium chloride 20 MEQ/15ML (10%) SOLN Take 15 mLs (20 mEq total) by mouth 2 (two) times daily. Take 15 ml twice a day for 4 days, then daily 01/08/22  Yes Owens Shark, NP  prochlorperazine (COMPAZINE) 10 MG tablet Take 1 tablet (10 mg total) by mouth every 6 (six) hours as needed for nausea or vomiting. 10/28/21  Yes Owens Shark, NP  trifluridine-tipiracil (LONSURF) 20-8.19 MG tablet Take 3 tablets (60 mg trifluridine) in AM and 2 tablets (40 mg trifluridine) in PM. Take within 1 hr after AM & PM meals on days 1-5, 8-12. Repeat every 28 days. Start cycle on 03/03/22 02/25/22  Yes Ladell Pier, MD    Physical Exam: Vitals:   03/21/22 1808 03/21/22 1900  BP: (!) 180/137   Pulse: 93   Resp: 16   Temp: 99.1 F (37.3 C)   TempSrc: Oral   SpO2: 100%   Height:  '5\' 4"'$  (1.626 m)   Constitutional: NAD, calm, middle-age female sitting upright in bed.  Had active nausea with vomiting shortly after drinking Ensure. Eyes: lids and conjunctivae normal ENMT: Mucous membranes are moist.  Neck: normal, supple Respiratory: clear to auscultation bilaterally, no wheezing, no  crackles. Normal respiratory effort. No accessory muscle use.  Cardiovascular: Regular rate and rhythm, no murmurs / rubs / gallops. No extremity edema.  Abdomen: Soft, nondistended, mild diffuse tenderness across upper abdomen.   Musculoskeletal: no clubbing / cyanosis. No joint deformity upper and lower extremities. Good ROM, no contractures. Normal muscle tone.  Skin: no rashes, lesions, ulcers. No induration Neurologic: CN 2-12 grossly intact.  Strength 5/5 in all 4.  Psychiatric: Normal judgment and insight. Alert and oriented x 3. Normal mood. Data Reviewed:  See HPI  Assessment and Plan: * Hyperbilirubinemia Abnormal LFTs  -moderately elevated AST of 296 and ALT of 441 with total bilirubin of 6.1, all of which was normal 03/03/2022. -concerning for hepatobiliary obstruction potentially from malignancy with her hx of colorectal cancer -will obtain CT abd/pelvis as she also has bilateral nephrolithiasis and a complicated urological hx with hx of hydronephrosis -may need GI consultation depending on imaging results -keep  on maintenance IV fluids overnight -PRN antiemetics and pain medication -Full liquid diet and can advance as tolerated  Nephrolithiasis -abdominal X-ray showed bilateral nephrolithiasis and she is reporting less urine output. Creatinine is normal on presentation. 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 Benay Spice who will evaluate her on Monday but can consult oncology PRN over the weekend  Hypokalemia -will administer IV 50mq x2  Hypertension -elevated.  -continue amlodipine, hydralazine, metoprolol and HCTZ      Advance Care Planning:   Code Status: Full Code   Consults: oncology direct admission. Dr SBenay Spiceto follow on Monday but can consult  PRN over the weekend  Family Communication: none at bedside  Severity of Illness: The appropriate patient status for this patient is INPATIENT. Inpatient status is judged to be reasonable and necessary in order to provide the required intensity of service to ensure the patient's safety. The patient's presenting symptoms, physical exam findings, and initial radiographic and laboratory data in the context of their chronic comorbidities is felt to place them at high risk for further clinical deterioration. Furthermore, it is not anticipated that the patient will be medically stable for discharge from the hospital within 2 midnights of admission.   * I certify that at the point of admission it is my clinical judgment that the patient will require inpatient hospital care spanning beyond 2 midnights from the point of admission due to high intensity of service, high risk for further deterioration and high frequency of surveillance required.*  Author: COrene Desanctis DO 03/21/2022 8:27 PM  For on call review [email or web address]

## 2022-03-21 NOTE — Progress Notes (Signed)
CRITICAL VALUE STICKER  CRITICAL VALUE: total bilirubin 6.1, AST 296, and ALT 441  RECEIVER (on-site recipient of call): Rudi Heap   DATE & TIME NOTIFIED: 1400 03/21/22  MESSENGER (representative from lab):D.Leory Plowman  MD NOTIFIED: Owens Shark  TIME OF NOTIFICATION: [id number] 03/21/22  RESPONSE: Patient is currently in the room with the provider

## 2022-03-21 NOTE — Assessment & Plan Note (Signed)
-  abdominal X-ray showed bilateral nephrolithiasis and she is reporting less urine output. Creatinine is normal on presentation. -check UA and pending CT abd/pelvis to evaluate for any hydronephrosis

## 2022-03-21 NOTE — Assessment & Plan Note (Addendum)
Abnormal LFTs  -moderately elevated AST of 296 and ALT of 441 with total bilirubin of 6.1, all of which was normal 03/03/2022. -concerning for hepatobiliary obstruction potentially from malignancy with her hx of colorectal cancer -will obtain CT abd/pelvis as she also has bilateral nephrolithiasis and a complicated urological hx with hx of hydronephrosis -may need GI consultation depending on imaging results -keep on maintenance IV fluids overnight -PRN antiemetics and pain medication -Full liquid diet and can advance as tolerated

## 2022-03-21 NOTE — Progress Notes (Signed)
Patient arrived to room via wheelchair.   BP (!) 180/137 (BP Location: Right Arm)   Pulse 93   Temp 99.1 F (37.3 C) (Oral)   Resp 16   LMP 01/07/2006   SpO2 100%   Resting comfortably st this time. [facility] paged.  Haydee Salter, RN

## 2022-03-21 NOTE — Assessment & Plan Note (Signed)
-  will administer IV 70meq x2

## 2022-03-21 NOTE — Progress Notes (Signed)
[facility] OFFICE PROGRESS NOTE   Diagnosis: Colon cancer  INTERVAL HISTORY:   April [name] returns for follow-up.  She completed cycle 6 Lonsurf beginning 03/03/2022.  She continues Avastin every 2 weeks, most recent 03/03/2022.  She reports developing abdominal pain and constipation about a week ago.  She took Trulance x 1 with no significant results.  She subsequently developed nausea/vomiting.  She noted her urine was dark.  She intermittently feels cold and then sweats.  No documented fever.  The nausea and vomiting have resolved.  She has not had a bowel movement in now over 1 week.  She notes aching discomfort at the upper abdomen and rectum.  When she wipes the rectal area she notes stool on the toilet tissue.  Objective:  Vital signs in last 24 hours:  Blood pressure 134/88, pulse 100, temperature 98.1 F (36.7 C), temperature source Oral, resp. rate 18, height '5\' 4"'$  (1.626 m), weight 154 lb (69.9 kg), last menstrual period 01/07/2006, SpO2 100 %.    HEENT: White coating over tongue.  No buccal thrush.  Mucous membranes are moist.  Sclera anicteric. Resp: Lungs clear bilaterally. Cardio: Regular rate and rhythm. GI: Abdomen is soft, tender across the upper abdomen.  Bowel sounds present.  No hepatomegaly. Vascular: No leg edema. Neuro: Alert and oriented.   Lab Results:  Lab Results  Component Value Date   WBC 6.0 03/21/2022   HGB 14.3 03/21/2022   HCT 41.2 03/21/2022   MCV 91.8 03/21/2022   PLT 226 03/21/2022   NEUTROABS 3.2 03/21/2022    Imaging:  No results found.  Medications: I have reviewed the patient's current medications.  Assessment/Plan: Sigmoid colon cancer, stage IV (pT4a,pN2b,M1c) Colonoscopy 03/24/2019-3 rectal polyps-hyperplastic polyps, distal colon biopsy-at least intramucosal adenocarcinoma, completely obstructing mass in the distal sigmoid colon, could not be traversed, intact mismatch repair protein  expression 03/24/2019-CEA 56.1 03/29/2019 CT abdomen/pelvis-circumferential thickening involving the entire mid and distal sigmoid colon to the level of the rectosigmoid junction, solid/cystic mass in the left ovary, bilateral nephrolithiasis Robotic assisted low anterior resection, mesenteric lymphadenectomy, bilateral salpingo-oophorectomy 04/08/2019 Pathology ([facility] review of outside pathology) metastatic adenocarcinoma involving the peritoneum overlying the round ligament, serosa of the urinary bladder, serosa of the right ureter a sacral area, pelvic peritoneum, and left ovary.  Omental biopsy with focal mucin pools with no carcinoma cells identified, right hemidiaphragm biopsy involved by metastatic adenocarcinoma, invasive adenocarcinoma the sigmoid colon, moderately differentiated, T4a, perineural and vascular invasion present, 7/22 lymph nodes, multiple tumor deposits, resection margins negative Negative for PD-L1, low probability of MSI-high, HER-2 negative, negative for BRAF, NRAS and KRAS alterations CTs 05/19/2019-no evidence of metastatic disease, findings suspicious for colitis of the transverse and ascending colon, small amount of ascites in the cul-de-sac, bilateral renal calculi Cycle 1 FOLFIRI 05/24/2019, bevacizumab added with cycle 2 Cycle 4 FOLFIRI/bevacizumab 07/05/2019 Cycle 5 FOLFIRI 07/27/2019, bevacizumab held secondary to hypertension Cycle 6 FOLFIRI 08/10/1999, bevacizumab held secondary to hypertension CTs 08/30/2019-no evidence of recurrent disease, new subsolid right lower lobe nodule felt to be inflammatory, emphysema Cycle 7 FOLFIRI 09/26/2019, bevacizumab remains on hold secondary to hypertension Cycle 8 FOLFIRI 10/17/2019, bevacizumab held secondary to hypertension, Udenyca added for neutropenia Cycle 9 FOLFIRI 11/07/2019, bevacizumab held secondary to hypertension, Udenyca Cycle 10 FOLFIRI 11/28/2019, bevacizumab held, Udenyca Cycle 11 FOLFIRI 12/26/2019, bevacizumab held,  Udenyca CTs 01/25/2020-no evidence of recurrent disease, resolution of right lower lobe nodule Maintenance Xeloda beginning 02/06/2020 CTs 04/25/2020- no evidence of metastatic disease, multiple bilateral renal  calculi without hydronephrosis Maintenance Xeloda continued CT abdomen/pelvis without contrast 08/10/2020-bilateral staghorn renal calculi, no evidence of metastatic disease; addendum 08/23/2020-small but increasing omental nodules. Cycle 1 FOLFIRI/panitumumab 09/19/2020 Cycle 2 FOLFIRI/Panitumumab 10/03/2020 Cycle 3 FOLFIRI/Panitumumab 10/17/2020 Cycle 4 FOLFIRI/panitumumab 10/31/2020 Cycle 5 FOLFIRI/Panitumumab 11/14/2020 CTs 11/26/2020-stable omental metastases compared to 10/22/2020, mildly decreased from 08/10/2020.  Left lower quadrant tiny paracolic gutter implant slightly decreased from CT 08/10/2020.  No new or progressive metastatic disease in the abdomen or pelvis. Cycle 6 FOLFIRI/Panitumumab 11/28/2020, irinotecan dose reduced, treatment schedule adjusted to every 3 weeks going forward Cycle 7 FOLFIRI/panitumumab 12/24/2020 Cycle 8 FOLFIRI/Panitumumab 01/16/2021--treatment held due to hypertension in the infusion area. Cycle 8 FOLFIRI/panitumumab 01/21/2021 Cycle 9 FOLFIRI/panitumumab 02/11/2021 Cycle 10 FOLFIRI/Panitumumab 03/04/2021 Cycle 11 FOLFIRI/Panitumumab 03/25/2021 CT abdomen/pelvis 04/10/2021-stable right omental and left posterior paracolic gutter nodules Cycle 12 FOLFIRI/panitumumab 04/15/2021 Cycle 13 FOLFIRI/panitumumab 05/06/2021 Cycle 14 FOLFIRI/Panitumumab 05/27/2021 Cycle 15 FOLFIRI/Panitumumab 06/17/2021 CT abdomen/pelvis 07/05/2021-slight enlargement of a dominant right omental implant, no new implants Patient requested a treatment break CT abdomen/pelvis 10/04/2021-mild increase in size of multiple omental soft tissue nodules, 4 mm calculus in the distal left ureter adjacent to the ureteral stent Cycle 1 Lonsurf 10/14/2021 10/28/2021 Avastin every 2 weeks Cycle 2 Lonsurf  11/11/2021 Cycle 3 Lonsurf 12/09/2021 Cycle 4 Lonsurf 01/06/2022 Avastin held 01/20/2022 due to proteinuria, 24-hour urine 43 mg CT/pelvis 01/30/2022-possible new peritoneal implant at the transverse colon, no significant change in other omental/peritoneal implants, stable chronic rectal wall thickening, status post removal of double-J left ureteral stent with no evidence of hydronephrosis or ureteral calculus, nonobstructing bilateral renal calculi Cycle 5 Lonsurf 02/05/2022 or 02/06/2022 Avastin every 2 weeks Cycle 6 Lonsurf 03/03/2022   Hypertension G4 P3, twins Kidney stones-bilateral staghorn renal calculi on CT 08/10/2020 Infected Port-A-Cath 09/12/2019-placed on Augmentin, referred for Port-A-Cath removal; Port-A-Cath removed 09/14/2019; PICC line placed 09/14/2019; culture staph aureus.  Course of Septra completed. Neutropenia secondary to chemotherapy-Udenyca added with cycle 8 FOLFIRI Right nephrostomy tube 09/12/2020; stent placed 10/09/2020, percutaneous nephrostolithotomy treatment of right-sided kidney stones 10/09/2020, malpositioned right ureter stent replacement 10/23/2020, right ureter stent removed 10/29/2020 Percutaneous left nephrostolithotomy 10/01/2021-no renal stone identified, left ureter stent left in place Cystoscopy 10/14/2021-stent removed  Disposition: April [name] has metastatic colorectal cancer currently on active treatment with Lonsurf/Avastin.  She presents today for follow-up prior to Avastin.  She has been feeling unwell for about the past week with nausea/vomiting, constipation, abdominal pain.  LFTs are markedly abnormal.  She understands the recommendation is for hospitalization.  She is in agreement.  We are making arrangements for admission.  Patient seen with Dr. Benay Spice.  Ned Card ANP/GNP-BC   03/21/2022  1:35 PM This was a shared visit with Ned Card.  Ms. Lynne has a history of metastatic colon cancer with carcinomatosis, currently being treated with  Lonsurf/bevacizumab.  She presents today with upper abdominal pain, nausea, and laboratory evidence of biliary obstruction.  I recommend hospital admission for urgent imaging to evaluate the biliary obstruction and GI consultation is indicated.  I will check on her 03/24/2022.  Please call oncology over the weekend as needed.  I was present for greater than 50% of today's visit.  I performed medical decision making.  Julieanne Manson, MD

## 2022-03-21 NOTE — Assessment & Plan Note (Signed)
-  elevated.  -continue amlodipine, hydralazine, metoprolol and HCTZ

## 2022-03-22 ENCOUNTER — Inpatient Hospital Stay (HOSPITAL_COMMUNITY): Payer: 59

## 2022-03-22 DIAGNOSIS — C19 Malignant neoplasm of rectosigmoid junction: Secondary | ICD-10-CM | POA: Diagnosis not present

## 2022-03-22 DIAGNOSIS — R7989 Other specified abnormal findings of blood chemistry: Secondary | ICD-10-CM | POA: Diagnosis not present

## 2022-03-22 LAB — URINALYSIS, ROUTINE W REFLEX MICROSCOPIC
Glucose, UA: NEGATIVE mg/dL
Ketones, ur: 20 mg/dL — AB
Leukocytes,Ua: NEGATIVE
Nitrite: NEGATIVE
Protein, ur: 30 mg/dL — AB
Specific Gravity, Urine: 1.015 (ref 1.005–1.030)
pH: 6 (ref 5.0–8.0)

## 2022-03-22 LAB — CBC
HCT: 38.7 % (ref 36.0–46.0)
Hemoglobin: 13.7 g/dL (ref 12.0–15.0)
MCH: 32.3 pg (ref 26.0–34.0)
MCHC: 35.4 g/dL (ref 30.0–36.0)
MCV: 91.3 fL (ref 80.0–100.0)
Platelets: 206 10*3/uL (ref 150–400)
RBC: 4.24 MIL/uL (ref 3.87–5.11)
RDW: 14 % (ref 11.5–15.5)
WBC: 6.5 10*3/uL (ref 4.0–10.5)
nRBC: 0 % (ref 0.0–0.2)

## 2022-03-22 LAB — COMPREHENSIVE METABOLIC PANEL
ALT: 364 U/L — ABNORMAL HIGH (ref 0–44)
AST: 267 U/L — ABNORMAL HIGH (ref 15–41)
Albumin: 3.2 g/dL — ABNORMAL LOW (ref 3.5–5.0)
Alkaline Phosphatase: 555 U/L — ABNORMAL HIGH (ref 38–126)
Anion gap: 14 (ref 5–15)
BUN: 7 mg/dL (ref 6–20)
CO2: 24 mmol/L (ref 22–32)
Calcium: 9.1 mg/dL (ref 8.9–10.3)
Chloride: 98 mmol/L (ref 98–111)
Creatinine, Ser: 0.59 mg/dL (ref 0.44–1.00)
GFR, Estimated: >60 mL/min (ref >60)
Glucose, Bld: 90 mg/dL (ref 70–99)
Potassium: 3.1 mmol/L — ABNORMAL LOW (ref 3.5–5.1)
Sodium: 136 mmol/L (ref 135–145)
Total Bilirubin: 7.2 mg/dL — ABNORMAL HIGH (ref 0.3–1.2)
Total Protein: 6.5 g/dL (ref 6.5–8.1)

## 2022-03-22 LAB — HEPATITIS PANEL, ACUTE
HCV Ab: NONREACTIVE
Hep A IgM: NONREACTIVE
Hep B C IgM: NONREACTIVE
Hepatitis B Surface Ag: NONREACTIVE

## 2022-03-22 MED ORDER — POLYETHYLENE GLYCOL 3350 17 G PO PACK
17.0000 g | PACK | Freq: Two times a day (BID) | ORAL | Status: DC
  Administered 2022-03-24 – 2022-03-26 (×3): 17 g via ORAL
  Filled 2022-03-22 (×8): qty 1

## 2022-03-22 MED ORDER — GADOBUTROL 1 MMOL/ML IV SOLN
7.0000 mL | Freq: Once | INTRAVENOUS | Status: AC | PRN
  Administered 2022-03-22: 7 mL via INTRAVENOUS

## 2022-03-22 MED ORDER — MORPHINE SULFATE (PF) 2 MG/ML IV SOLN
1.0000 mg | INTRAVENOUS | Status: DC | PRN
  Administered 2022-03-22 – 2022-03-25 (×14): 2 mg via INTRAVENOUS
  Filled 2022-03-22 (×15): qty 1

## 2022-03-22 MED ORDER — HYDRALAZINE HCL 20 MG/ML IJ SOLN
10.0000 mg | Freq: Four times a day (QID) | INTRAMUSCULAR | Status: AC | PRN
  Administered 2022-03-22 (×2): 10 mg via INTRAVENOUS
  Filled 2022-03-22 (×2): qty 1

## 2022-03-22 MED ORDER — MORPHINE SULFATE (PF) 2 MG/ML IV SOLN
1.0000 mg | INTRAVENOUS | Status: DC | PRN
  Administered 2022-03-22: 1 mg via INTRAVENOUS

## 2022-03-22 MED ORDER — POTASSIUM CHLORIDE CRYS ER 20 MEQ PO TBCR: 40.0000 meq | EXTENDED_RELEASE_TABLET | Freq: Once | ORAL | Status: DC

## 2022-03-22 MED ORDER — BISACODYL 10 MG RE SUPP
10.0000 mg | Freq: Every day | RECTAL | Status: DC
  Administered 2022-03-25: 10 mg via RECTAL
  Filled 2022-03-22 (×4): qty 1

## 2022-03-22 MED ORDER — POTASSIUM CHLORIDE 20 MEQ PO PACK
40.0000 meq | PACK | Freq: Once | ORAL | Status: AC
  Administered 2022-03-22: 40 meq via ORAL
  Filled 2022-03-22: qty 2

## 2022-03-22 MED ORDER — IBUPROFEN 400 MG PO TABS
400.0000 mg | ORAL_TABLET | ORAL | Status: DC | PRN
  Administered 2022-03-22 – 2022-03-27 (×4): 400 mg via ORAL
  Filled 2022-03-22 (×4): qty 1

## 2022-03-22 MED ORDER — HYDRALAZINE HCL 20 MG/ML IJ SOLN
10.0000 mg | INTRAMUSCULAR | Status: DC | PRN
  Administered 2022-03-22: 10 mg via INTRAVENOUS
  Filled 2022-03-22 (×2): qty 1

## 2022-03-22 NOTE — Consult Note (Signed)
Consultation Note   Referring Provider:  [facility] PCP: Orma Render, NP Primary Gastroenterologist: Dr. Latricia Heft - [place]        Reason for consultation: Metastatic colon cancer / elevated liver chemistries   Hospital Day: 2  ASSESSMENT  47 yo female with Stage IV sigmoid colon cancer, currently being treated with Lonsurf / Avastin.    Recent nausea / vomiting / constipation / abdominal pain and markedly abnormal liver tests in mixed a pattern Liver chemistries were normal mid February.  Lonsurf can cause hepatocellular injury but apparently not a cholestatic picture.  Biliary obstruction possibly secondary to metastatic cancer. N/V better. Upper abdominal pain is controlled with pain meds.Still constipated   PLAN - Await acute hepatitis panel - Await CT scan to evaluate for biliary obstruction /space occupying liver lesion -Miralax BID, Dulcolax supp daily.   HPI:  Patient is a 47 y.o. year old female with a past medical history of  kidney stones, and stage IV sigmoid colon cancer. Cancer diagnosed in March 2021.    See PMH for any additional medical problems. She is followed by Dr. Benay Spice. She was seen by Oncology yesterday. She has completed cycle 6 Lonsurf and on Avastin Q 2 weeks.   Kjirsten has been have abdominal pain, constipation and N/V for about a week. She noticed urine was dark. Nausea / vomiting has resolved. She has a new elevation in liver tests.  CT scan has been ordered.     Latest Reference Range & Units 03/22/22 03:50  Alkaline Phosphatase 38 - 126 U/L 555 (H)  Albumin 3.5 - 5.0 g/dL 3.2 (L)  AST 15 - 41 U/L 267 (H)  ALT 0 - 44 U/L 364 (H)  Total Protein 6.5 - 8.1 g/dL 6.5  Total Bilirubin 0.3 - 1.2 mg/dL 7.2 (H)           Recent Imaging and Labs: DG Abd 2 Views  Result Date: 03/21/2022 CLINICAL DATA:  History of colorectal cancer.  Constipation. EXAM: ABDOMEN - 2 VIEW  COMPARISON:  None Available. FINDINGS: Lung bases are clear. Mild S shaped curvature of the thoracolumbar spine. Nonobstructive bowel gas pattern. No pneumatosis. No portal venous gas. No supine evidence of pneumoperitoneum. There are degenerative changes in the lower lumbar spine. Assessment of the sacrum is limited due to overlying bowel gas. Right pelvic phleboliths. Large colonic stool burden. Redemonstrated bilateral nephrolithiasis. There is an additional calcific density projecting along the left psoas muscle which could represent a ureteral calculus. IMPRESSION: 1. Nonobstructive bowel gas pattern. Large colonic stool burden. 2. Redemonstrated bilateral nephrolithiasis. There is an additional calcific density projecting along the left psoas muscle which could represent a ureteral calculus. Recommend further evaluation with bilateral renal ultrasound there is clinical concern for hydronephrosis. Electronically Signed   By: Marin Roberts M.D.   On: 03/21/2022 13:59      01/30/22 non contrast CT AP IMPRESSION: 1. Possible new peritoneal implant along the superior aspect of the mid transverse colon. Multiple other omental/peritoneal implants are grossly unchanged from the most recent study allowing for changes in position of the colon and omentum. 2. No evidence of bowel obstruction or perforation. 3. Interval removal of a double-J left ureteral stent.  No evidence of hydronephrosis or ureteral calculus. Nonobstructing bilateral renal calculi are unchanged. 4. Stable chronic rectal wall thickening. 5. 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 6.  Aortic Atherosclerosis (ICD10-I70.0)   Labs:  Recent Labs    03/21/22 1310 03/22/22 0350  WBC 6.0 6.5  HGB 14.3 13.7  HCT 41.2 38.7  PLT 226 206   Recent Labs    03/21/22 1310 03/22/22 0350  NA 136 136  K 3.0* 3.1*  CL 95* 98  CO2 30 24   GLUCOSE 107* 90  BUN 11 7  CREATININE 0.70 0.59  CALCIUM 10.1 9.1   Recent Labs    03/22/22 0350  PROT 6.5  ALBUMIN 3.2*  AST 267*  ALT 364*  ALKPHOS 555*  BILITOT 7.2*   No results for input(s): "HEPBSAG", "HCVAB", "HEPAIGM", "HEPBIGM" in the last 72 hours. No results for input(s): "LABPROT", "INR" in the last 72 hours.  Past Medical History:  Diagnosis Date   Benign essential hypertension 10/06/2017   Last Assessment & Plan:  Formatting of this note might be different from the original. Patient's blood pressure noted to be elevated at today's visit 188/98.  Patient will be provided with antihypertensives in the treatment center if needed in order to meet her Avastin parameters.  Patient recommended to follow up with her primary care physician regarding medication management.  Patient is not cur   Cancer ([place])    Colon cancer ([place])    Hypertension    Renal calculi    bilateral    Past Surgical History:  Procedure Laterality Date   CESAREAN SECTION     x2   CYSTOSCOPY WITH RETROGRADE PYELOGRAM, URETEROSCOPY AND STENT PLACEMENT Right 10/22/2020   Procedure: CYSTOSCOPY WITH RETROGRADE PYELOGRAM, URETEROSCOPY AND STENT PLACEMENT;  Surgeon: Robley Fries, MD;  Location: [facility];  Service: Urology;  Laterality: Right;   IR RADIOLOGIST EVAL & MGMT  09/16/2019   IR RADIOLOGIST EVAL & MGMT  09/23/2019   IR RADIOLOGIST EVAL & MGMT  09/20/2019   IR RADIOLOGIST EVAL & MGMT  09/30/2019   IR REMOVAL TUN ACCESS W/ PORT W/O FL MOD SED  09/14/2019   URETERAL STENT PLACEMENT      Family History  Problem Relation Age of Onset   Hypertension Mother    Diabetes Mellitus II Father    Stroke Father    Ulcers Sister     Prior to Admission medications   Medication Sig Start Date End Date Taking? Authorizing Provider  amLODipine (NORVASC) 10 MG tablet Take 1 tablet (10 mg total) by mouth daily. 07/18/21  Yes Early, Coralee Pesa, NP  hydrALAZINE (APRESOLINE) 10 MG tablet Take 1 tablet (10 mg total)  by mouth 3 (three) times daily. 12/23/21  Yes Ladell Pier, MD  hydrochlorothiazide (HYDRODIURIL) 25 MG tablet Take 1 tablet (25 mg total) by mouth daily. 02/03/22  Yes Ladell Pier, MD  HYDROcodone-acetaminophen (NORCO) 10-325 MG tablet Take 1 tablet by mouth every 6 (six) hours as needed. Patient taking differently: Take 1 tablet by mouth every 6 (six) hours as needed for moderate pain. 02/03/22  Yes Owens Shark, NP  metoprolol tartrate (LOPRESSOR) 25 MG tablet Take 1 tablet (25 mg total) by mouth 2 (two) times daily. 07/18/21  Yes Early, Coralee Pesa, NP  Plecanatide (TRULANCE) 3 MG TABS Take by mouth. 09/30/21  Yes [provider]  potassium chloride 20 MEQ/15ML (10%) SOLN Take 15 mLs (  20 mEq total) by mouth 2 (two) times daily. Take 15 ml twice a day for 4 days, then daily 01/08/22  Yes Owens Shark, NP  prochlorperazine (COMPAZINE) 10 MG tablet Take 1 tablet (10 mg total) by mouth every 6 (six) hours as needed for nausea or vomiting. 10/28/21  Yes Owens Shark, NP  trifluridine-tipiracil (LONSURF) 20-8.19 MG tablet Take 3 tablets (60 mg trifluridine) in AM and 2 tablets (40 mg trifluridine) in PM. Take within 1 hr after AM & PM meals on days 1-5, 8-12. Repeat every 28 days. Start cycle on 03/03/22 Patient taking differently: Take 1 tablet by mouth See admin instructions. Take 3 tablets (60 mg trifluridine) in AM and 2 tablets (40 mg trifluridine) in PM. Take within 1 hr after AM & PM meals on days 1-5, 8-12. Repeat every 28 days. Start cycle on 03/03/22 02/25/22  Yes Ladell Pier, MD    Current Facility-Administered Medications  Medication Dose Route Frequency Provider Last Rate Last Admin   0.9 %  sodium chloride infusion   Intravenous Continuous Tu, Ching T, DO 75 mL/hr at 03/22/22 0600 New Bag at 03/22/22 0600   amLODipine (NORVASC) tablet 10 mg  10 mg Oral Daily Tu, Ching T, DO   10 mg at 03/22/22 0811   enoxaparin (LOVENOX) injection 40 mg  40 mg Subcutaneous Q24H Tu, Ching  T, DO   40 mg at 03/21/22 1954   hydrALAZINE (APRESOLINE) tablet 10 mg  10 mg Oral TID Tu, Ching T, DO   10 mg at 03/22/22 [id number]   hydrochlorothiazide (HYDRODIURIL) tablet 25 mg  25 mg Oral Daily Tu, Ching T, DO   25 mg at 03/22/22 [id number]   HYDROcodone-acetaminophen (NORCO) 10-325 MG per tablet 1 tablet  1 tablet Oral Q6H PRN Tu, Royal [place]ied[place] T, DO   1 tablet at 03/22/22 0541   ibuprofen (ADVIL) tablet 400 mg  400 mg Oral Q4H PRN Donne Hazel, MD   400 mg at 03/22/22 [id number]   metoprolol tartrate (LOPRESSOR) tablet 25 mg  25 mg Oral BID Tu, Ching T, DO   25 mg at 03/22/22 [id number]   morphine (PF) 2 MG/ML injection 1-2 mg  1-2 mg Intravenous Q2H PRN Donne Hazel, MD       ondansetron [facility]) injection 4 mg  4 mg Intravenous Q6H PRN Tu, Ching T, DO   4 mg at 03/22/22 [id number]    Allergies as of 03/21/2022 - Review Complete 03/21/2022  Allergen Reaction Noted   Lisinopril Swelling 07/21/2019   Irinotecan Other (See Comments) 10/03/2020   Leucovorin calcium Other (See Comments) 10/03/2020    Social History   Socioeconomic History   Marital status: Single    Spouse name: Not on file   Number of children: Not on file   Years of education: Not on file   Highest education level: Not on file  Occupational History   Not on file  Tobacco Use   Smoking status: Every Day    Packs/day: 0.50    Types: Cigarettes   Smokeless tobacco: Never   Tobacco comments:    In the process of quitting/ has been able to cut back  Vaping Use   Vaping Use: Never used  Substance and Sexual Activity   Alcohol use: Yes   Drug use: Not Currently   Sexual activity: Not Currently  Other Topics Concern   Not on file  Social History Narrative   Not on file   Social Determinants of Health  Financial Resource Strain: High Risk (04/16/2021)   Overall Financial Resource Strain (CARDIA)    Difficulty of Paying Living Expenses: Hard  Food Insecurity: Food Insecurity Present (03/21/2022)   Hunger Vital Sign    Worried About  Running Out of Food in the Last Year: Sometimes true    Ran Out of Food in the Last Year: Sometimes true  Transportation Needs: Unmet Transportation Needs (03/21/2022)   PRAPARE - Hydrologist (Medical): Yes    Lack of Transportation (Non-Medical): No  Physical Activity: Inactive (04/16/2021)   Exercise Vital Sign    Days of Exercise per Week: 0 days    Minutes of Exercise per Session: 0 min  Stress: Stress Concern Present (04/16/2021)   [place]    Feeling of Stress : Rather much  Social Connections: Socially Isolated (04/16/2021)   Social Connection and Isolation Panel [NHANES]    Frequency of Communication with Friends and Family: Once a week    Frequency of Social Gatherings with Friends and Family: Once a week    Attends Religious Services: Never    Marine scientist or Organizations: No    Attends Archivist Meetings: Never    Marital Status: Divorced  Human resources officer Violence: Not At Risk (03/21/2022)   Humiliation, Afraid, Rape, and Kick questionnaire    Fear of Current or Ex-Partner: No    Emotionally Abused: No    Physically Abused: No    Sexually Abused: No    Review of Systems: All systems reviewed and negative except where noted in HPI.  Physical Exam: Vital signs in last 24 hours: Temp:  [97.5 F (36.4 C)-99.1 F (37.3 C)] 98.2 F (36.8 C) (03/09 0753) Pulse Rate:  [80-100] 95 (03/09 0753) Resp:  [16-18] 18 (03/09 0753) BP: (134-180)/(88-137) 138/103 (03/09 0753) SpO2:  [96 %-100 %] 97 % (03/09 0753) Weight:  [69.9 kg] 69.9 kg (03/08 1323) Last BM Date :  (more than 1 week)  General:  Alert female in NAD Psych:  Pleasant, cooperative. Normal mood and affect Eyes: Pupils equal Ears:  Normal auditory acuity Nose: No deformity, discharge or lesions Neck:  Supple, no masses felt Lungs:  Clear to auscultation.  Heart:  Regular rate, regular rhythm.   Abdomen:  Soft, nondistended, nontender, active bowel sounds, no masses felt Rectal :  Deferred Msk: Symmetrical without gross deformities.  Neurologic:  Alert, oriented, grossly normal neurologically Extremities : No edema Skin:  Without significant lesions.    Intake/Output from previous day: 03/08 0701 - 03/09 0700 In: 737.4 [I.V.:737.4] Out: -  Intake/Output this shift:  No intake/output data recorded.    Principal Problem:   Hyperbilirubinemia Active Problems:   Hypertension   Hypokalemia   Colorectal cancer, stage IV (HCC)   Abnormal LFTs   Nephrolithiasis    Tye Savoy, NP-C @  03/22/2022, 9:22 AM

## 2022-03-22 NOTE — Progress Notes (Signed)
  Progress Note   Patient: April [name] WHQ:[id number] DOB: 1975-12-27 DOA: 03/21/2022     1 DOS: the patient was seen and examined on 03/22/2022   Brief hospital course: 47 y.o. 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 Benay Spice), hypertension, recurrent bouts of nephrolithiasis with multiple urologic interventions at [facility] urology who presents as a direct admission from [facility] health cancer center for abnormal LFTs and hyperbilirubinemia. GI consulted  Assessment and Plan: * Hyperbilirubinemia Abnormal LFTs  -Presented with elevated alk phos to 600's with AST/ALT elevation to the 300-400's, all of which was normal 03/03/2022. -concerning for hepatobiliary obstruction potentially from malignancy with her hx of colorectal cancer -GI consulted, recs for MRCP, which has been performed, awaiting results   Nephrolithiasis -abdominal X-ray showed bilateral nephrolithiasis and she is reporting less urine output. Creatinine is normal on presentation. 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 currently (on Avastin and Lonsurf, most recent doses 03/03/2022) -Oncology consulted   Hypokalemia -K of 3.1, will replace -Recheck bmet in AM   Hypertension -continue amlodipine, hydralazine, metoprolol and HCTZ -will add PRN hydralazine   Subjective: Nauseated this AM, improved with antiemetic  Physical Exam: Vitals:   03/22/22 0014 03/22/22 0521 03/22/22 0753 03/22/22 1552  BP: (!) 172/121 (!) 159/114 (!) 138/103 (!) 166/113  Pulse: 80 91 95 81  Resp: 16 16 18 17   Temp: (!) 97.5 F (36.4 C) 98.5 F (36.9 C) 98.2 F (36.8 C) 98 F (36.7 C)  TempSrc: Oral Oral Oral   SpO2: 97% 96% 97% 100%  Height:       General exam: Awake, laying in bed, in nad Respiratory system:  Normal respiratory effort, no wheezing Cardiovascular system: regular rate, s1, s2 Gastrointestinal system: Soft, nondistended, positive BS Central nervous system: CN2-12 grossly intact, strength intact Extremities: Perfused, no clubbing Skin: Normal skin turgor, no notable skin lesions seen, jaundice Psychiatry: Mood normal // no visual hallucinations   Data Reviewed:  Labs reviewed: Na 136, K 3.1, Cr 0.59, Hgb 13.7, WBC 6.5  Family Communication: Pt in room, family not at bedside  Disposition: Status is: Inpatient Remains inpatient appropriate because: Severity of illness  Planned Discharge Destination: Home    Author: Marylu Lund, MD 03/22/2022 4:11 PM  For on call review [email or web address].

## 2022-03-22 NOTE — Hospital Course (Signed)
April [name] is a 47 y.o. 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 Benay Spice), hypertension, recurrent bouts of nephrolithiasis with multiple urologic interventions at [facility] urology who presents as a direct admission from [facility] health cancer center for abnormal LFTs and hyperbilirubinemia. GI consulted. Patient found to have multiple malignant strictures on ERCP and underwent dilation and pancreatic duct stenting.

## 2022-03-22 NOTE — Plan of Care (Signed)
  Problem: Clinical Measurements: Goal: Diagnostic test results will improve Outcome: Not Progressing   Problem: Elimination: Goal: Will not experience complications related to bowel motility Outcome: Not Progressing   Problem: Pain Managment: Goal: General experience of comfort will improve Outcome: Not Progressing   

## 2022-03-22 NOTE — Progress Notes (Signed)
Followed with CT regarding CT abdomen, staff said they'll call once they can accommodate patient, updated patient.

## 2022-03-22 NOTE — Plan of Care (Signed)

## 2022-03-23 DIAGNOSIS — R7989 Other specified abnormal findings of blood chemistry: Secondary | ICD-10-CM | POA: Diagnosis not present

## 2022-03-23 DIAGNOSIS — K831 Obstruction of bile duct: Principal | ICD-10-CM

## 2022-03-23 DIAGNOSIS — C19 Malignant neoplasm of rectosigmoid junction: Secondary | ICD-10-CM | POA: Diagnosis not present

## 2022-03-23 LAB — COMPREHENSIVE METABOLIC PANEL
ALT: 367 U/L — ABNORMAL HIGH (ref 0–44)
AST: 282 U/L — ABNORMAL HIGH (ref 15–41)
Albumin: 3.3 g/dL — ABNORMAL LOW (ref 3.5–5.0)
Alkaline Phosphatase: 755 U/L — ABNORMAL HIGH (ref 38–126)
Anion gap: 12 (ref 5–15)
BUN: 6 mg/dL (ref 6–20)
CO2: 25 mmol/L (ref 22–32)
Calcium: 9.7 mg/dL (ref 8.9–10.3)
Chloride: 98 mmol/L (ref 98–111)
Creatinine, Ser: 0.59 mg/dL (ref 0.44–1.00)
GFR, Estimated: >60 mL/min (ref >60)
Glucose, Bld: 86 mg/dL (ref 70–99)
Potassium: 3.5 mmol/L (ref 3.5–5.1)
Sodium: 135 mmol/L (ref 135–145)
Total Bilirubin: 9.1 mg/dL — ABNORMAL HIGH (ref 0.3–1.2)
Total Protein: 7.2 g/dL (ref 6.5–8.1)

## 2022-03-23 LAB — MAGNESIUM: Magnesium: 1.9 mg/dL (ref 1.7–2.4)

## 2022-03-23 NOTE — H&P (View-Only) (Signed)
Daily Progress Note  DOA: 03/21/2022 Hospital Day: 3  Chief Complaint: abdominal pain, abnormal liver tests, metastatic colon cancer  ASSESSMENT 47 yo female with the following:   Stage IV sigmoid colon cancer, currently being treated with Lonsurf / Avastin.     Abdominal pain , jaundice and obstructing liver mass / progressive peritoneal carcinomatosis on MRCP.     No evidence for cholangitis. Worsening cholestasis today. MRCP reviewed by Dr. Henrene Pastor and Dr. Havery Moros. There is a complex high grade biliary stricture it seems.    PLAN  -CMV / EBV pending -NPO after MN for possible ERCP with Dr. Rush Landmark tomorrow. The risks and benefits of ERCP with possible biopsies / stenting were explained to the patient and she agrees to proceed.    -Will stop Lovenox after this evenings dose   Subjective / New events last 24 hours: Crying when I entered the room. Just told about mass in liver.   Imaging:  MRI ABDOMEN WITHOUT AND WITH CONTRAST (INCLUDING MRCP)   TECHNIQUE: Multiplanar multisequence MR imaging of the abdomen was performed both before and after the administration of intravenous contrast. Heavily T2-weighted images of the biliary and pancreatic ducts were obtained, and three-dimensional MRCP images were rendered by post processing.   CONTRAST:  21m GADAVIST GADOBUTROL 1 MMOL/ML IV SOLN   COMPARISON:  No prior abdominal MRI. CT the abdomen and pelvis 01/30/2022.   FINDINGS: Lower chest: Unremarkable.   Hepatobiliary: MRCP images demonstrate severe intrahepatic biliary ductal dilatation. Distal common hepatic bile duct is normal in caliber measuring 5 mm. Common bile duct measures 4 mm in the porta hepatis. Coronal MRCP image 95 of series 8 demonstrates a gap of approximately 1.1 cm between the most proximal patent portion of the common hepatic duct and the remaining patent and dilated intrahepatic biliary tree. On postcontrast images this area is remarkable for  a mass-like region of heterogeneous hypoenhancement on early arterial phase imaging which becomes a more mass-like confluent area of enhancement on post gadolinium delayed images (axial image 56 of series 1104), estimated to measure approximately 2.5 x 1.6 cm. This area demonstrates diffusion restriction. No filling defects in the more distal aspect of the common bile duct are noted to suggest choledocholithiasis. Gallbladder is only moderately distended but contains material that is intermediate to high signal intensity on T1 weighted images and slightly low signal intensity on T2 weighted images, likely reflecting biliary sludge. No pericholecystic fluid or surrounding inflammatory changes.   Pancreas: No pancreatic mass. No pancreatic ductal dilatation. No pancreatic or peripancreatic fluid collections or inflammatory changes.   Spleen:  Unremarkable.   Adrenals/Urinary Tract: 1.6 cm T1 hypointense, T2 hyperintense, nonenhancing lesion in the medial aspect of the interpolar region of the left kidney, compatible with a small simple cyst (no imaging follow-up recommended). Signal voids are present within the collecting systems of both kidneys (upper pole of the left kidney and interpolar region of the right kidney), corresponding to large calculi noted on prior CT examination. No aggressive appearing renal lesions are noted. No hydroureteronephrosis in the visualized portions of the abdomen. Bilateral adrenal glands are normal in appearance.   Stomach/Bowel: Visualized portions are unremarkable.   Vascular/Lymphatic: No aneurysm identified in the visualized abdominal vasculature. No definite lymphadenopathy noted in the abdomen.   Other: Numerous enhancing nodules are noted in the peritoneal cavity, indicative of widespread peritoneal carcinomatosis, largest of which is located inferior to the right lobe of the liver (axial image 82 of series 1102) measuring  2.6 x 1.5 cm  (previously 2.4 x 0.9 cm on CT examination 01/30/2022 on axial image 23 of series 2 of that study). Trace volume of perihepatic ascites.   Musculoskeletal: No aggressive appearing osseous lesions are noted in the visualized portions of the skeleton.   IMPRESSION: 1. Centrally located mass in the liver causing obstruction of the confluence of the intrahepatic bile ducts and the proximal common hepatic duct, resulting in severe intrahepatic biliary ductal dilatation. This mass demonstrates progressively increasing enhancement on delayed images. These imaging findings are most compatible with an aggressive neoplasm such as a cholangiocarcinoma, and are not favored to represent a metastatic lesion. 2. Progressive peritoneal carcinomatosis, as above. 3. Additional incidental findings, as above.   These results were discussed by telephone at the time of interpretation on 03/23/2022 at 7:45 AM with provider Dr. Havery Moros, who verbally acknowledged these results.     Electronically Signed   By: Vinnie Langton M.D.   On: 03/23/2022 07:55  IMPRESSION: 1. Possible new peritoneal implant along the superior aspect of the mid transverse colon. Multiple other omental/peritoneal implants are grossly unchanged from the most recent study allowing for changes in position of the colon and omentum. 2. No evidence of bowel obstruction or perforation. 3. Interval removal of a double-J left ureteral stent. No evidence of hydronephrosis or ureteral calculus. Nonobstructing bilateral renal calculi are unchanged. 4. Stable chronic rectal wall thickening. 5. 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 6.  Aortic Atherosclerosis (ICD10-I70.0)   Lab Results: Recent Labs    03/21/22 1310 03/22/22 0350  WBC 6.0 6.5  HGB 14.3 13.7  HCT 41.2 38.7  PLT 226 206   BMET Recent Labs    03/21/22 1310  03/22/22 0350 03/23/22 0428  NA 136 136 135  K 3.0* 3.1* 3.5  CL 95* 98 98  CO2 '30 24 25  '$ GLUCOSE 107* 90 86  BUN '11 7 6  '$ CREATININE 0.70 0.59 0.59  CALCIUM 10.1 9.1 9.7   LFT Recent Labs    03/23/22 0428  PROT 7.2  ALBUMIN 3.3*  AST 282*  ALT 367*  ALKPHOS 755*  BILITOT 9.1*   PT/INR No results for input(s): "LABPROT", "INR" in the last 72 hours.   Scheduled inpatient medications:   amLODipine  10 mg Oral Daily   bisacodyl  10 mg Rectal Daily   enoxaparin (LOVENOX) injection  40 mg Subcutaneous Q24H   hydrALAZINE  10 mg Oral TID   hydrochlorothiazide  25 mg Oral Daily   metoprolol tartrate  25 mg Oral BID   polyethylene glycol  17 g Oral BID   Continuous inpatient infusions:  PRN inpatient medications: hydrALAZINE, HYDROcodone-acetaminophen, ibuprofen, morphine injection, ondansetron (ZOFRAN) IV  Vital signs in last 24 hours: Temp:  [97.7 F (36.5 C)-98.4 F (36.9 C)] 97.7 F (36.5 C) (03/10 0758) Pulse Rate:  [77-81] 77 (03/10 0758) Resp:  [17-18] 18 (03/10 0758) BP: (135-166)/(99-113) 135/112 (03/10 0758) SpO2:  [100 %] 100 % (03/10 0758) Last BM Date :  (more than 1 week) No intake or output data in the 24 hours ending 03/23/22 1120  Intake/Output from previous day: 03/09 0701 - 03/10 0700 In: 240 [P.O.:240] Out: -  Intake/Output this shift: No intake/output data recorded.   Physical Exam:  General: Alert female in NAD Heart:  Regular rate and rhythm.  Pulmonary: Normal respiratory effort Abdomen: Soft, nondistended, nontender. Normal bowel sounds. Extremities: No lower extremity edema  Neurologic: Alert and oriented Psych: Sad but pleasant. Insight appears normal.    Principal Problem:   Hyperbilirubinemia Active Problems:   Hypertension   Hypokalemia   Colorectal cancer, stage IV (HCC)   Abnormal LFTs   Nephrolithiasis     LOS: 2 days   Tye Savoy ,NP 03/23/2022, 11:20 AM

## 2022-03-23 NOTE — Progress Notes (Signed)
  Progress Note   Patient: April [name] JHE:[id number] DOB: [birth year] DOA: 03/21/2022     2 DOS: the patient was seen and examined on 03/23/2022   Brief hospital course: 47 y.o. 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 Benay Spice), hypertension, recurrent bouts of nephrolithiasis with multiple urologic interventions at [facility] urology who presents as a direct admission from [facility] for abnormal LFTs and hyperbilirubinemia. GI consulted  Assessment and Plan: * Hyperbilirubinemia Abnormal LFTs  -Presented with elevated alk phos to 600's with AST/ALT elevation to the 300-400's, all of which was normal 03/03/2022. -MRCP reviewed. Concerns for centrally located mass in the liver causing obstruction of the confluence of intrahepatic ducts. Findings suggestive of cholangiocarcinoma -GI following. Query if obstruction is amenable to ERCP with stenting or if she requires perc drain per IR. 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 Otherwise feeling hungry  Physical Exam: Vitals:   03/22/22 0753 03/22/22 1552 03/23/22 0430 03/23/22 0758  BP: (!) 138/103 (!) 166/113 (!) 137/99 (!) 135/112  Pulse: 95 81 78 77  Resp: 18 17 17 18   Temp: 98.2 F (36.8  C) 98 F (36.7 C) 98.4 F (36.9 C) 97.7 F (36.5 C)  TempSrc: Oral  Oral Oral  SpO2: 97% 100% 100% 100%  Height:       General exam: Conversant, in no acute distress Respiratory system: normal chest rise, clear, no audible wheezing Cardiovascular system: regular rhythm, s1-s2 Gastrointestinal system: Nondistended, nontender, pos BS Central nervous system: No seizures, no tremors Extremities: No cyanosis, no joint deformities Skin: No rashes, no pallor Psychiatry: Affect normal // no auditory hallucinations   Data Reviewed:  Labs reviewed: Na 135, K 3.5, Cr 0.59, Alk phos 755, AST 282, ALT 367, Total bili 9.1  Family Communication: Pt in room, family not at bedside  Disposition: Status is: Inpatient Remains inpatient appropriate because: Severity of illness  Planned Discharge Destination: Home    Author: Marylu Lund, MD 03/23/2022 4:00 PM  For on call review [email or web address].

## 2022-03-23 NOTE — Progress Notes (Signed)
Daily Progress Note  DOA: 03/21/2022 Hospital Day: 3  Chief Complaint: abdominal pain, abnormal liver tests, metastatic colon cancer  ASSESSMENT 47 yo female with the following:   Stage IV sigmoid colon cancer, currently being treated with Lonsurf / Avastin.     Abdominal pain , jaundice and obstructing liver mass / progressive peritoneal carcinomatosis on MRCP.     No evidence for cholangitis. Worsening cholestasis today. MRCP reviewed by Dr. Henrene Pastor and Dr. Havery Moros. There is a complex high grade biliary stricture it seems.    PLAN  -CMV / EBV pending -NPO after MN for possible ERCP with Dr. Rush Landmark tomorrow. The risks and benefits of ERCP with possible biopsies / stenting were explained to the patient and she agrees to proceed.    -Will stop Lovenox after this evenings dose   Subjective / New events last 24 hours: Crying when I entered the room. Just told about mass in liver.   Imaging:  MRI ABDOMEN WITHOUT AND WITH CONTRAST (INCLUDING MRCP)   TECHNIQUE: Multiplanar multisequence MR imaging of the abdomen was performed both before and after the administration of intravenous contrast. Heavily T2-weighted images of the biliary and pancreatic ducts were obtained, and three-dimensional MRCP images were rendered by post processing.   CONTRAST:  76m GADAVIST GADOBUTROL 1 MMOL/ML IV SOLN   COMPARISON:  No prior abdominal MRI. CT the abdomen and pelvis 01/30/2022.   FINDINGS: Lower chest: Unremarkable.   Hepatobiliary: MRCP images demonstrate severe intrahepatic biliary ductal dilatation. Distal common hepatic bile duct is normal in caliber measuring 5 mm. Common bile duct measures 4 mm in the porta hepatis. Coronal MRCP image 95 of series 8 demonstrates a gap of approximately 1.1 cm between the most proximal patent portion of the common hepatic duct and the remaining patent and dilated intrahepatic biliary tree. On postcontrast images this area is remarkable for  a mass-like region of heterogeneous hypoenhancement on early arterial phase imaging which becomes a more mass-like confluent area of enhancement on post gadolinium delayed images (axial image 56 of series 1104), estimated to measure approximately 2.5 x 1.6 cm. This area demonstrates diffusion restriction. No filling defects in the more distal aspect of the common bile duct are noted to suggest choledocholithiasis. Gallbladder is only moderately distended but contains material that is intermediate to high signal intensity on T1 weighted images and slightly low signal intensity on T2 weighted images, likely reflecting biliary sludge. No pericholecystic fluid or surrounding inflammatory changes.   Pancreas: No pancreatic mass. No pancreatic ductal dilatation. No pancreatic or peripancreatic fluid collections or inflammatory changes.   Spleen:  Unremarkable.   Adrenals/Urinary Tract: 1.6 cm T1 hypointense, T2 hyperintense, nonenhancing lesion in the medial aspect of the interpolar region of the left kidney, compatible with a small simple cyst (no imaging follow-up recommended). Signal voids are present within the collecting systems of both kidneys (upper pole of the left kidney and interpolar region of the right kidney), corresponding to large calculi noted on prior CT examination. No aggressive appearing renal lesions are noted. No hydroureteronephrosis in the visualized portions of the abdomen. Bilateral adrenal glands are normal in appearance.   Stomach/Bowel: Visualized portions are unremarkable.   Vascular/Lymphatic: No aneurysm identified in the visualized abdominal vasculature. No definite lymphadenopathy noted in the abdomen.   Other: Numerous enhancing nodules are noted in the peritoneal cavity, indicative of widespread peritoneal carcinomatosis, largest of which is located inferior to the right lobe of the liver (axial image 82 of series 1102) measuring  2.6 x 1.5 cm  (previously 2.4 x 0.9 cm on CT examination 01/30/2022 on axial image 23 of series 2 of that study). Trace volume of perihepatic ascites.   Musculoskeletal: No aggressive appearing osseous lesions are noted in the visualized portions of the skeleton.   IMPRESSION: 1. Centrally located mass in the liver causing obstruction of the confluence of the intrahepatic bile ducts and the proximal common hepatic duct, resulting in severe intrahepatic biliary ductal dilatation. This mass demonstrates progressively increasing enhancement on delayed images. These imaging findings are most compatible with an aggressive neoplasm such as a cholangiocarcinoma, and are not favored to represent a metastatic lesion. 2. Progressive peritoneal carcinomatosis, as above. 3. Additional incidental findings, as above.   These results were discussed by telephone at the time of interpretation on 03/23/2022 at 7:45 AM with provider Dr. Havery Moros, who verbally acknowledged these results.     Electronically Signed   By: Vinnie Langton M.D.   On: 03/23/2022 07:55  IMPRESSION: 1. Possible new peritoneal implant along the superior aspect of the mid transverse colon. Multiple other omental/peritoneal implants are grossly unchanged from the most recent study allowing for changes in position of the colon and omentum. 2. No evidence of bowel obstruction or perforation. 3. Interval removal of a double-J left ureteral stent. No evidence of hydronephrosis or ureteral calculus. Nonobstructing bilateral renal calculi are unchanged. 4. Stable chronic rectal wall thickening. 5. 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 6.  Aortic Atherosclerosis (ICD10-I70.0)   Lab Results: Recent Labs    03/21/22 1310 03/22/22 0350  WBC 6.0 6.5  HGB 14.3 13.7  HCT 41.2 38.7  PLT 226 206   BMET Recent Labs    03/21/22 1310  03/22/22 0350 03/23/22 0428  NA 136 136 135  K 3.0* 3.1* 3.5  CL 95* 98 98  CO2 '30 24 25  '$ GLUCOSE 107* 90 86  BUN '11 7 6  '$ CREATININE 0.70 0.59 0.59  CALCIUM 10.1 9.1 9.7   LFT Recent Labs    03/23/22 0428  PROT 7.2  ALBUMIN 3.3*  AST 282*  ALT 367*  ALKPHOS 755*  BILITOT 9.1*   PT/INR No results for input(s): "LABPROT", "INR" in the last 72 hours.   Scheduled inpatient medications:   amLODipine  10 mg Oral Daily   bisacodyl  10 mg Rectal Daily   enoxaparin (LOVENOX) injection  40 mg Subcutaneous Q24H   hydrALAZINE  10 mg Oral TID   hydrochlorothiazide  25 mg Oral Daily   metoprolol tartrate  25 mg Oral BID   polyethylene glycol  17 g Oral BID   Continuous inpatient infusions:  PRN inpatient medications: hydrALAZINE, HYDROcodone-acetaminophen, ibuprofen, morphine injection, ondansetron (ZOFRAN) IV  Vital signs in last 24 hours: Temp:  [97.7 F (36.5 C)-98.4 F (36.9 C)] 97.7 F (36.5 C) (03/10 0758) Pulse Rate:  [77-81] 77 (03/10 0758) Resp:  [17-18] 18 (03/10 0758) BP: (135-166)/(99-113) 135/112 (03/10 0758) SpO2:  [100 %] 100 % (03/10 0758) Last BM Date :  (more than 1 week) No intake or output data in the 24 hours ending 03/23/22 1120  Intake/Output from previous day: 03/09 0701 - 03/10 0700 In: 240 [P.O.:240] Out: -  Intake/Output this shift: No intake/output data recorded.   Physical Exam:  General: Alert female in NAD Heart:  Regular rate and rhythm.  Pulmonary: Normal respiratory effort Abdomen: Soft, nondistended, nontender. Normal bowel sounds. Extremities: No lower extremity edema  Neurologic: Alert and oriented Psych: Sad but pleasant. Insight appears normal.    Principal Problem:   Hyperbilirubinemia Active Problems:   Hypertension   Hypokalemia   Colorectal cancer, stage IV (HCC)   Abnormal LFTs   Nephrolithiasis     LOS: 2 days   Tye Savoy ,NP 03/23/2022, 11:20 AM

## 2022-03-24 ENCOUNTER — Encounter: Payer: Self-pay | Admitting: Oncology

## 2022-03-24 ENCOUNTER — Inpatient Hospital Stay (HOSPITAL_COMMUNITY): Payer: 59

## 2022-03-24 ENCOUNTER — Other Ambulatory Visit (HOSPITAL_COMMUNITY): Payer: Self-pay

## 2022-03-24 ENCOUNTER — Inpatient Hospital Stay (HOSPITAL_COMMUNITY): Payer: 59 | Admitting: Anesthesiology

## 2022-03-24 ENCOUNTER — Encounter (HOSPITAL_COMMUNITY)
Admission: RE | Disposition: A | Discharge status: Home / Self Care | Payer: Self-pay | Source: Ambulatory Visit | Attending: Internal Medicine

## 2022-03-24 ENCOUNTER — Encounter (HOSPITAL_COMMUNITY): Payer: Self-pay | Admitting: Family Medicine

## 2022-03-24 DIAGNOSIS — K831 Obstruction of bile duct: Secondary | ICD-10-CM

## 2022-03-24 DIAGNOSIS — K449 Diaphragmatic hernia without obstruction or gangrene: Secondary | ICD-10-CM

## 2022-03-24 DIAGNOSIS — K209 Esophagitis, unspecified without bleeding: Secondary | ICD-10-CM

## 2022-03-24 DIAGNOSIS — C189 Malignant neoplasm of colon, unspecified: Secondary | ICD-10-CM

## 2022-03-24 DIAGNOSIS — K3189 Other diseases of stomach and duodenum: Secondary | ICD-10-CM

## 2022-03-24 HISTORY — PX: ERCP: SHX5425

## 2022-03-24 HISTORY — PX: BILIARY BRUSHING: SHX6843

## 2022-03-24 HISTORY — PX: PANCREATIC STENT PLACEMENT: SHX5539

## 2022-03-24 HISTORY — PX: REMOVAL OF STONES: SHX5545

## 2022-03-24 HISTORY — PX: SPHINCTEROTOMY: SHX5544

## 2022-03-24 HISTORY — PX: BILIARY STENT PLACEMENT: SHX5538

## 2022-03-24 HISTORY — PX: BILIARY DILATION: SHX6850

## 2022-03-24 HISTORY — PX: BIOPSY: SHX5522

## 2022-03-24 LAB — COMPREHENSIVE METABOLIC PANEL
ALT: 388 U/L — ABNORMAL HIGH (ref 0–44)
AST: 403 U/L — ABNORMAL HIGH (ref 15–41)
Albumin: 3.3 g/dL — ABNORMAL LOW (ref 3.5–5.0)
Alkaline Phosphatase: 809 U/L — ABNORMAL HIGH (ref 38–126)
Anion gap: 11 (ref 5–15)
BUN: 10 mg/dL (ref 6–20)
CO2: 29 mmol/L (ref 22–32)
Calcium: 9.6 mg/dL (ref 8.9–10.3)
Chloride: 93 mmol/L — ABNORMAL LOW (ref 98–111)
Creatinine, Ser: 0.73 mg/dL (ref 0.44–1.00)
GFR, Estimated: >60 mL/min (ref >60)
Glucose, Bld: 99 mg/dL (ref 70–99)
Potassium: 3.1 mmol/L — ABNORMAL LOW (ref 3.5–5.1)
Sodium: 133 mmol/L — ABNORMAL LOW (ref 135–145)
Total Bilirubin: 11 mg/dL — ABNORMAL HIGH (ref 0.3–1.2)
Total Protein: 7.1 g/dL (ref 6.5–8.1)

## 2022-03-24 LAB — EBV AB TO VIRAL CAPSID AG PNL, IGG+IGM
EBV VCA IgG: >600 U/mL — ABNORMAL HIGH (ref 0.0–17.9)
EBV VCA IgM: <36 U/mL (ref 0.0–35.9)

## 2022-03-24 SURGERY — ERCP, WITH INTERVENTION IF INDICATED
Anesthesia: General

## 2022-03-24 MED ORDER — FENTANYL CITRATE (PF) 100 MCG/2ML IJ SOLN
25.0000 ug | Freq: Once | INTRAMUSCULAR | Status: AC
  Administered 2022-03-24: 25 ug via INTRAVENOUS

## 2022-03-24 MED ORDER — FENTANYL CITRATE (PF) 250 MCG/5ML IJ SOLN
INTRAMUSCULAR | Status: DC | PRN
  Administered 2022-03-24 (×2): 50 ug via INTRAVENOUS

## 2022-03-24 MED ORDER — LACTATED RINGERS IV SOLN: INTRAVENOUS | Status: DC

## 2022-03-24 MED ORDER — POTASSIUM CHLORIDE CRYS ER 20 MEQ PO TBCR: 40.0000 meq | EXTENDED_RELEASE_TABLET | ORAL | Status: DC

## 2022-03-24 MED ORDER — DICLOFENAC SUPPOSITORY 100 MG
RECTAL | Status: DC | PRN
  Administered 2022-03-24: 100 mg via RECTAL

## 2022-03-24 MED ORDER — PHENYLEPHRINE 80 MCG/ML (10ML) SYRINGE FOR IV PUSH (FOR BLOOD PRESSURE SUPPORT)
PREFILLED_SYRINGE | INTRAVENOUS | Status: DC | PRN
  Administered 2022-03-24 (×2): 80 ug via INTRAVENOUS

## 2022-03-24 MED ORDER — LACTATED RINGERS IV SOLN: INTRAVENOUS | Status: AC

## 2022-03-24 MED ORDER — SUGAMMADEX SODIUM 200 MG/2ML IV SOLN
INTRAVENOUS | Status: DC | PRN
  Administered 2022-03-24: 200 mg via INTRAVENOUS

## 2022-03-24 MED ORDER — FENTANYL CITRATE (PF) 100 MCG/2ML IJ SOLN
INTRAMUSCULAR | Status: AC
  Filled 2022-03-24: qty 2

## 2022-03-24 MED ORDER — PANTOPRAZOLE SODIUM 40 MG PO TBEC
40.0000 mg | DELAYED_RELEASE_TABLET | Freq: Two times a day (BID) | ORAL | Status: DC
  Administered 2022-03-24 – 2022-03-27 (×7): 40 mg via ORAL
  Filled 2022-03-24 (×8): qty 1

## 2022-03-24 MED ORDER — POTASSIUM CHLORIDE 10 MEQ/100ML IV SOLN
10.0000 meq | INTRAVENOUS | Status: AC
  Administered 2022-03-24 (×2): 10 meq via INTRAVENOUS
  Filled 2022-03-24 (×3): qty 100

## 2022-03-24 MED ORDER — PHENYLEPHRINE HCL-NACL 20-0.9 MG/250ML-% IV SOLN
INTRAVENOUS | Status: DC | PRN
  Administered 2022-03-24: 20 ug/min via INTRAVENOUS

## 2022-03-24 MED ORDER — GLUCAGON HCL RDNA (DIAGNOSTIC) 1 MG IJ SOLR
INTRAMUSCULAR | Status: AC
  Filled 2022-03-24: qty 2

## 2022-03-24 MED ORDER — LACTATED RINGERS IV SOLN: INTRAVENOUS | Status: DC | PRN

## 2022-03-24 MED ORDER — PROPOFOL 10 MG/ML IV BOLUS
INTRAVENOUS | Status: DC | PRN
  Administered 2022-03-24: 100 mg via INTRAVENOUS
  Administered 2022-03-24 (×2): 50 mg via INTRAVENOUS

## 2022-03-24 MED ORDER — AMOXICILLIN-POT CLAVULANATE 875-125 MG PO TABS
1.0000 | ORAL_TABLET | Freq: Two times a day (BID) | ORAL | Status: DC
  Administered 2022-03-24 – 2022-03-27 (×6): 1 via ORAL
  Filled 2022-03-24 (×7): qty 1

## 2022-03-24 MED ORDER — DEXAMETHASONE SODIUM PHOSPHATE 10 MG/ML IJ SOLN
INTRAMUSCULAR | Status: DC | PRN
  Administered 2022-03-24: 10 mg via INTRAVENOUS

## 2022-03-24 MED ORDER — GLUCAGON HCL RDNA (DIAGNOSTIC) 1 MG IJ SOLR
INTRAMUSCULAR | Status: AC
  Filled 2022-03-24: qty 1

## 2022-03-24 MED ORDER — LIDOCAINE 2% (20 MG/ML) 5 ML SYRINGE
INTRAMUSCULAR | Status: DC | PRN
  Administered 2022-03-24: 60 mg via INTRAVENOUS

## 2022-03-24 MED ORDER — INDOMETHACIN 50 MG RE SUPP
RECTAL | Status: AC
  Filled 2022-03-24: qty 2

## 2022-03-24 MED ORDER — ONDANSETRON HCL 4 MG/2ML IJ SOLN
INTRAMUSCULAR | Status: DC | PRN
  Administered 2022-03-24: 4 mg via INTRAVENOUS

## 2022-03-24 MED ORDER — SODIUM CHLORIDE 0.9 % IV SOLN
INTRAVENOUS | Status: DC | PRN
  Administered 2022-03-24: 40 mL

## 2022-03-24 MED ORDER — SODIUM CHLORIDE 0.9 % IV SOLN
1.5000 g | INTRAVENOUS | Status: DC
  Filled 2022-03-24: qty 4

## 2022-03-24 MED ORDER — ROCURONIUM BROMIDE 10 MG/ML (PF) SYRINGE
PREFILLED_SYRINGE | INTRAVENOUS | Status: DC | PRN
  Administered 2022-03-24 (×2): 20 mg via INTRAVENOUS
  Administered 2022-03-24: 10 mg via INTRAVENOUS
  Administered 2022-03-24: 40 mg via INTRAVENOUS

## 2022-03-24 MED ORDER — SUCCINYLCHOLINE CHLORIDE 200 MG/10ML IV SOSY
PREFILLED_SYRINGE | INTRAVENOUS | Status: DC | PRN
  Administered 2022-03-24: 120 mg via INTRAVENOUS

## 2022-03-24 MED ORDER — GLUCAGON HCL RDNA (DIAGNOSTIC) 1 MG IJ SOLR
INTRAMUSCULAR | Status: DC | PRN
  Administered 2022-03-24 (×5): .25 mg via INTRAVENOUS

## 2022-03-24 MED ORDER — DICLOFENAC SUPPOSITORY 100 MG
RECTAL | Status: AC
  Filled 2022-03-24: qty 1

## 2022-03-24 NOTE — Plan of Care (Signed)

## 2022-03-24 NOTE — Transfer of Care (Signed)
Immediate Anesthesia Transfer of Care Note  Patient: April [name]  Procedure(s) Performed: ENDOSCOPIC RETROGRADE CHOLANGIOPANCREATOGRAPHY (ERCP) SPHINCTEROTOMY PANCREATIC STENT PLACEMENT BILIARY BRUSHING REMOVAL OF STONES BILIARY DILATION BILIARY STENT PLACEMENT BIOPSY  Patient Location: PACU and Endoscopy Unit  Anesthesia Type:General  Level of Consciousness: drowsy and patient cooperative  Airway & Oxygen Therapy: Patient Spontanous Breathing  Post-op Assessment: Report given to RN and Post -op Vital signs reviewed and stable  Post vital signs: Reviewed and stable  Last Vitals:  Vitals Value Taken Time  BP 138/113 03/24/22 1616  Temp    Pulse 115 03/24/22 1617  Resp 17 03/24/22 1617  SpO2 94 % 03/24/22 1617  Vitals shown include unvalidated device data.  Last Pain:  Vitals:   03/24/22 1616  TempSrc:   PainSc: 6       Patients Stated Pain Goal: 1 ([id number])  Complications: No notable events documented.

## 2022-03-24 NOTE — Progress Notes (Signed)
IP PROGRESS NOTE  Subjective:   April [name] is well-known to me with a history of metastatic colon cancer.  She was admitted 03/21/2022 with abdominal pain, nausea, and new onset jaundice.  The admission chemistry panel was consistent with biliary obstruction.  She continues to have nausea and upper abdominal pain.  Objective: Vital signs in last 24 hours: Blood pressure (!) 147/112, pulse 89, temperature 98.6 F (37 C), temperature source Oral, resp. rate 16, height '5\' 4"'$  (1.626 m), last menstrual period 01/07/2006, SpO2 97 %.  Intake/Output from previous day: No intake/output data recorded.  Physical Exam:  HEENT: No thrush Lungs: Clear bilaterally Cardiac: Regular rate and rhythm Abdomen: Tender in the mid and right upper abdomen, no mass, no hepatosplenomegaly Extremities: No leg edema  Portacath/PICC-without erythema  Lab Results: Recent Labs    03/21/22 1310 03/22/22 0350  WBC 6.0 6.5  HGB 14.3 13.7  HCT 41.2 38.7  PLT 226 206    BMET Recent Labs    03/23/22 0428 03/24/22 0251  NA 135 133*  K 3.5 3.1*  CL 98 93*  CO2 25 29  GLUCOSE 86 99  BUN 6 10  CREATININE 0.59 0.73  CALCIUM 9.7 9.6    Lab Results  Component Value Date   CEA1 8.66 (H) 07/02/2020   CEA 19.65 (H) 03/21/2022    Studies/Results: MR ABDOMEN MRCP W WO CONTAST  Result Date: 03/23/2022 CLINICAL DATA:  47 year old female with history of metastatic colon cancer. New elevation of liver function tests. Jaundice. EXAM: MRI ABDOMEN WITHOUT AND WITH CONTRAST (INCLUDING MRCP) TECHNIQUE: Multiplanar multisequence MR imaging of the abdomen was performed both before and after the administration of intravenous contrast. Heavily T2-weighted images of the biliary and pancreatic ducts were obtained, and three-dimensional MRCP images were rendered by post processing. CONTRAST:  30m GADAVIST GADOBUTROL 1 MMOL/ML IV SOLN COMPARISON:  No prior abdominal MRI. CT the abdomen and pelvis 01/30/2022. FINDINGS:  Lower chest: Unremarkable. Hepatobiliary: MRCP images demonstrate severe intrahepatic biliary ductal dilatation. Distal common hepatic bile duct is normal in caliber measuring 5 mm. Common bile duct measures 4 mm in the porta hepatis. Coronal MRCP image 95 of series 8 demonstrates a gap of approximately 1.1 cm between the most proximal patent portion of the common hepatic duct and the remaining patent and dilated intrahepatic biliary tree. On postcontrast images this area is remarkable for a mass-like region of heterogeneous hypoenhancement on early arterial phase imaging which becomes a more mass-like confluent area of enhancement on post gadolinium delayed images (axial image 56 of series 1104), estimated to measure approximately 2.5 x 1.6 cm. This area demonstrates diffusion restriction. No filling defects in the more distal aspect of the common bile duct are noted to suggest choledocholithiasis. Gallbladder is only moderately distended but contains material that is intermediate to high signal intensity on T1 weighted images and slightly low signal intensity on T2 weighted images, likely reflecting biliary sludge. No pericholecystic fluid or surrounding inflammatory changes. Pancreas: No pancreatic mass. No pancreatic ductal dilatation. No pancreatic or peripancreatic fluid collections or inflammatory changes. Spleen:  Unremarkable. Adrenals/Urinary Tract: 1.6 cm T1 hypointense, T2 hyperintense, nonenhancing lesion in the medial aspect of the interpolar region of the left kidney, compatible with a small simple cyst (no imaging follow-up recommended). Signal voids are present within the collecting systems of both kidneys (upper pole of the left kidney and interpolar region of the right kidney), corresponding to large calculi noted on prior CT examination. No aggressive appearing renal lesions are noted.  No hydroureteronephrosis in the visualized portions of the abdomen. Bilateral adrenal glands are normal in  appearance. Stomach/Bowel: Visualized portions are unremarkable. Vascular/Lymphatic: No aneurysm identified in the visualized abdominal vasculature. No definite lymphadenopathy noted in the abdomen. Other: Numerous enhancing nodules are noted in the peritoneal cavity, indicative of widespread peritoneal carcinomatosis, largest of which is located inferior to the right lobe of the liver (axial image 82 of series 1102) measuring 2.6 x 1.5 cm (previously 2.4 x 0.9 cm on CT examination 01/30/2022 on axial image 23 of series 2 of that study). Trace volume of perihepatic ascites. Musculoskeletal: No aggressive appearing osseous lesions are noted in the visualized portions of the skeleton. IMPRESSION: 1. Centrally located mass in the liver causing obstruction of the confluence of the intrahepatic bile ducts and the proximal common hepatic duct, resulting in severe intrahepatic biliary ductal dilatation. This mass demonstrates progressively increasing enhancement on delayed images. These imaging findings are most compatible with an aggressive neoplasm such as a cholangiocarcinoma, and are not favored to represent a metastatic lesion. 2. Progressive peritoneal carcinomatosis, as above. 3. Additional incidental findings, as above. These results were discussed by telephone at the time of interpretation on 03/23/2022 at 7:45 AM with provider Dr. Havery Moros, who verbally acknowledged these results. Electronically Signed   By: Vinnie Langton M.D.   On: 03/23/2022 07:55    Medications: I have reviewed the patient's current medications.  Assessment/Plan:  Sigmoid colon cancer, stage IV (pT4a,pN2b,M1c) Colonoscopy 03/24/2019-3 rectal polyps-hyperplastic polyps, distal colon biopsy-at least intramucosal adenocarcinoma, completely obstructing mass in the distal sigmoid colon, could not be traversed, intact mismatch repair protein expression 03/24/2019-CEA 56.1 03/29/2019 CT abdomen/pelvis-circumferential thickening involving  the entire mid and distal sigmoid colon to the level of the rectosigmoid junction, solid/cystic mass in the left ovary, bilateral nephrolithiasis Robotic assisted low anterior resection, mesenteric lymphadenectomy, bilateral salpingo-oophorectomy 04/08/2019 Pathology ([facility] review of outside pathology) metastatic adenocarcinoma involving the peritoneum overlying the round ligament, serosa of the urinary bladder, serosa of the right ureter a sacral area, pelvic peritoneum, and left ovary.  Omental biopsy with focal mucin pools with no carcinoma cells identified, right hemidiaphragm biopsy involved by metastatic adenocarcinoma, invasive adenocarcinoma the sigmoid colon, moderately differentiated, T4a, perineural and vascular invasion present, 7/22 lymph nodes, multiple tumor deposits, resection margins negative Negative for PD-L1, low probability of MSI-high, HER-2 negative, negative for BRAF, NRAS and KRAS alterations CTs 05/19/2019-no evidence of metastatic disease, findings suspicious for colitis of the transverse and ascending colon, small amount of ascites in the cul-de-sac, bilateral renal calculi Cycle 1 FOLFIRI 05/24/2019, bevacizumab added with cycle 2 Cycle 4 FOLFIRI/bevacizumab 07/05/2019 Cycle 5 FOLFIRI 07/27/2019, bevacizumab held secondary to hypertension Cycle 6 FOLFIRI 08/10/1999, bevacizumab held secondary to hypertension CTs 08/30/2019-no evidence of recurrent disease, new subsolid right lower lobe nodule felt to be inflammatory, emphysema Cycle 7 FOLFIRI 09/26/2019, bevacizumab remains on hold secondary to hypertension Cycle 8 FOLFIRI 10/17/2019, bevacizumab held secondary to hypertension, Udenyca added for neutropenia Cycle 9 FOLFIRI 11/07/2019, bevacizumab held secondary to hypertension, Udenyca Cycle 10 FOLFIRI 11/28/2019, bevacizumab held, Udenyca Cycle 11 FOLFIRI 12/26/2019, bevacizumab held, Udenyca CTs 01/25/2020-no evidence of recurrent disease, resolution of right lower lobe  nodule Maintenance Xeloda beginning 02/06/2020 CTs 04/25/2020- no evidence of metastatic disease, multiple bilateral renal calculi without hydronephrosis Maintenance Xeloda continued CT abdomen/pelvis without contrast 08/10/2020-bilateral staghorn renal calculi, no evidence of metastatic disease; addendum 08/23/2020-small but increasing omental nodules. Cycle 1 FOLFIRI/panitumumab 09/19/2020 Cycle 2 FOLFIRI/Panitumumab 10/03/2020 Cycle 3 FOLFIRI/Panitumumab 10/17/2020 Cycle 4 FOLFIRI/panitumumab 10/31/2020 Cycle 5  FOLFIRI/Panitumumab 11/14/2020 CTs 11/26/2020-stable omental metastases compared to 10/22/2020, mildly decreased from 08/10/2020.  Left lower quadrant tiny paracolic gutter implant slightly decreased from CT 08/10/2020.  No new or progressive metastatic disease in the abdomen or pelvis. Cycle 6 FOLFIRI/Panitumumab 11/28/2020, irinotecan dose reduced, treatment schedule adjusted to every 3 weeks going forward Cycle 7 FOLFIRI/panitumumab 12/24/2020 Cycle 8 FOLFIRI/Panitumumab 01/16/2021--treatment held due to hypertension in the infusion area. Cycle 8 FOLFIRI/panitumumab 01/21/2021 Cycle 9 FOLFIRI/panitumumab 02/11/2021 Cycle 10 FOLFIRI/Panitumumab 03/04/2021 Cycle 11 FOLFIRI/Panitumumab 03/25/2021 CT abdomen/pelvis 04/10/2021-stable right omental and left posterior paracolic gutter nodules Cycle 12 FOLFIRI/panitumumab 04/15/2021 Cycle 13 FOLFIRI/panitumumab 05/06/2021 Cycle 14 FOLFIRI/Panitumumab 05/27/2021 Cycle 15 FOLFIRI/Panitumumab 06/17/2021 CT abdomen/pelvis 07/05/2021-slight enlargement of a dominant right omental implant, no new implants Patient requested a treatment break CT abdomen/pelvis 10/04/2021-mild increase in size of multiple omental soft tissue nodules, 4 mm calculus in the distal left ureter adjacent to the ureteral stent Cycle 1 Lonsurf 10/14/2021 10/28/2021 Avastin every 2 weeks Cycle 2 Lonsurf 11/11/2021 Cycle 3 Lonsurf 12/09/2021 Cycle 4 Lonsurf 01/06/2022 Avastin held 01/20/2022 due  to proteinuria, 24-hour urine 43 mg CT/pelvis 01/30/2022-possible new peritoneal implant at the transverse colon, no significant change in other omental/peritoneal implants, stable chronic rectal wall thickening, status post removal of double-J left ureteral stent with no evidence of hydronephrosis or ureteral calculus, nonobstructing bilateral renal calculi Cycle 5 Lonsurf 02/05/2022 or 02/06/2022 Avastin every 2 weeks Cycle 6 Lonsurf 03/03/2022   Hypertension G4 P3, twins Kidney stones-bilateral staghorn renal calculi on CT 08/10/2020 Infected Port-A-Cath 09/12/2019-placed on Augmentin, referred for Port-A-Cath removal; Port-A-Cath removed 09/14/2019; PICC line placed 09/14/2019; culture staph aureus.  Course of Septra completed. Neutropenia secondary to chemotherapy-Udenyca added with cycle 8 FOLFIRI Right nephrostomy tube 09/12/2020; stent placed 10/09/2020, percutaneous nephrostolithotomy treatment of right-sided kidney stones 10/09/2020, malpositioned right ureter stent replacement 10/23/2020, right ureter stent removed 10/29/2020 Percutaneous left nephrostolithotomy 10/01/2021-no renal stone identified, left ureter stent left in place Cystoscopy 10/14/2021-stent removed 8.  Admission 03/21/2022 with new onset jaundice, abdominal pain, nausea MRI abdomen 03/22/2022-central liver mass with obstruction at the confluence of the intrahepatic ducts and proximal common hepatic duct with severe Intermatic biliary ductal dilatation, progressive peritoneal carcinomatosis  Ms. Maceachern has metastatic colon cancer, currently being treated with salvage systemic therapy consisting of Lonsurf and Avastin.  She last received Avastin 03/03/2022.  She is now admitted with obstructive jaundice.  I suspect her symptoms are related to biliary obstruction.  I reviewed the MRI findings with her.  I reviewed the MRI images.  I suspect the liver mass is related to metastatic colon cancer, but cholangiocarcinoma is possible.  We  discussed options for relieving biliary obstruction including an ERCP/stent and percutaneous decompression.  Her clinical presentation likely represents progression of colon cancer.  We will consider switching to a different systemic therapy regimen.  She will be referred for restaging CTs after treatment of the biliary obstruction.  Recommendations:   Management of the intrahepatic bili obstruction per gastroenterology Medical therapy for hypertension Bevacizumab and Lonsurf will be placed on hold, will consider salvage therapy with fruquintinib as an outpatient I will continue following her in the hospital and outpatient follow-up will be scheduled at the Cancer center   LOS: 3 days   Betsy Coder, MD   03/24/2022, 1:29 PM

## 2022-03-24 NOTE — Progress Notes (Signed)
   03/24/22 1124  Spiritual Encounters  Type of Visit Initial  Care provided to: Patient  Referral source Nurse (RN/NT/LPN)  Reason for visit Routine spiritual support  OnCall Visit No  Spiritual Framework  Presenting Themes Meaning/purpose/sources of inspiration;Goals in life/care;Values and beliefs;Significant life change;Impactful experiences and emotions;Courage hope and growth  Values/beliefs God's providential care  Strengths strong faith  Patient Stress Factors Health changes;Major life changes;Other (Comment) (finding care for her teenage daughters)  Family Stress Factors None identified  Goals  Self/Personal Goals Find care for her daughters in the event of her death.  Interventions  Spiritual Care Interventions Made Established relationship of care and support;Compassionate presence;Reflective listening;Normalization of emotions;Decision-making support/facilitation;Explored values/beliefs/practices/strengths;Encouragement  Intervention Outcomes  Outcomes Connection to spiritual care;Awareness around self/spiritual resourses;Connection to values and goals of care;Autonomy/agency;Awareness of health;Awareness of support;Reduced anxiety  Spiritual Care Plan  Spiritual Care Issues Still Outstanding Chaplain will continue to follow  Follow up plan  Chaplain will follow up 03/25/2022 to bring requested devotional material   Chaplain spoke with patient about her main concerns resulting from her health changes. Patient is "not afraid to die." She states she "knows where I am going." Patient is concerned about who will care for her teenage daughters once she is gone. Patient requested a book. Chaplain will follow up and bring some devotional material 03/25/2022.  Note prepared by Abbott Pao, chaplain resident [phone]

## 2022-03-24 NOTE — Progress Notes (Signed)
  Progress Note   Patient: April [name] MHD:[id number] DOB: 24-Jan-1975 DOA: 03/21/2022     3 DOS: the patient was seen and examined on 03/24/2022   Brief hospital course: 47 y.o. 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 Benay Spice), hypertension, recurrent bouts of nephrolithiasis with multiple urologic interventions at [facility] urology who presents as a direct admission from [facility] for abnormal LFTs and hyperbilirubinemia. GI consulted  Assessment and Plan: * Hyperbilirubinemia Abnormal LFTs  -Presented with elevated alk phos to 600's with AST/ALT elevation to the 300-400's, all of which was normal 03/03/2022. -MRCP reviewed. Concerns for centrally located mass in the liver causing obstruction of the confluence of intrahepatic ducts. Findings suggestive of cholangiocarcinoma -GI following. Pt now s/p ERCP s/p biliary sphincterotomy. Findings of malignant appearing stricture which was dilated with plastic stent placed on 3/11 -Appreciate recs by Dr. Benay Spice. 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 Pt very eager to have procedure done and is is in better spirits today  Physical Exam: Vitals:   03/24/22 1650 03/24/22 1700 03/24/22 1710 03/24/22 1720  BP:  (!) 140/112 (!) 131/94 (!) 132/98 (!) 122/90  Pulse: 100 (!) 105 89 90  Resp: 19 16 15 12   Temp:      TempSrc:      SpO2: 95% 93% 93% 94%  Height:       General exam: Conversant, in no acute distress Respiratory system: normal chest rise, clear, no audible wheezing Cardiovascular system: regular rhythm, s1-s2 Gastrointestinal system: Nondistended, nontender, pos BS Central nervous system: No seizures, no tremors Extremities: No cyanosis, no joint deformities Skin: No rashes, no pallor Psychiatry: Affect normal // no auditory hallucinations   Data Reviewed:  Labs reviewed: Na 133, K 3.1, Cr 0.73, Alk phos 809, AST 403, ALT 388, TB 11.0  Family Communication: Pt in room, family not at bedside  Disposition: Status is: Inpatient Remains inpatient appropriate because: Severity of illness  Planned Discharge Destination: Home    Author: Marylu Lund, MD 03/24/2022 5:34 PM  For on call review [email or web address].

## 2022-03-24 NOTE — Progress Notes (Signed)
Pt presented to endoscopy today for ERCP with MD Mansouraty. Post-operatively, pt reports 6/10 abdominal pain. Pt reported 5/10 abdominal pain pre-operatively. MD Mansouraty notified. VO for 25 mcg of fentanyl IV push received. MDA Turk notified and okay with 25 mcg fentanyl x1. Medication given as ordered.  MD Mansouraty at bedside at 1638. Pt's pain remains 6/10. Pt describes pain as "different" from pre-operative pain. VO for 25 mcg fentanyl IV push, Chest X-Ray, and KUB received. MDA Turk notified and okay with additional 25 mcg   Pt pain as of 1648 3/10. X-ray at bedside at 1650, MD Mansouraty at bedside. MD Mansouraty to place order for LR at 125 mL/hr. LR started as ordered.  As of 1725, X-rays resulted . MD Mansouraty notified of X-ray results. Per MD Mansouraty, okay to transfer pt to inpt unit. Pt transferred to inpt unit @ 1730.  Debarah Crape, RN 03/24/22 5:30 PM

## 2022-03-24 NOTE — Anesthesia Postprocedure Evaluation (Signed)
Anesthesia Post Note  Patient: Cordella Register  Procedure(s) Performed: ENDOSCOPIC RETROGRADE CHOLANGIOPANCREATOGRAPHY (ERCP) SPHINCTEROTOMY PANCREATIC STENT PLACEMENT BILIARY BRUSHING REMOVAL OF STONES BILIARY DILATION BILIARY STENT PLACEMENT BIOPSY     Patient location during evaluation: Endoscopy Anesthesia Type: General Level of consciousness: awake and alert Pain management: pain level controlled Vital Signs Assessment: post-procedure vital signs reviewed and stable Respiratory status: spontaneous breathing, nonlabored ventilation, respiratory function stable and patient connected to nasal cannula oxygen Cardiovascular status: blood pressure returned to baseline and stable Postop Assessment: no apparent nausea or vomiting Anesthetic complications: no   No notable events documented.  Last Vitals:  Vitals:   03/24/22 1720 03/24/22 1808  BP: (!) 122/90 (!) 149/106  Pulse: 90   Resp: 12   Temp:  36.5 C  SpO2: 94% 97%    Last Pain:  Vitals:   03/24/22 1808  TempSrc: Oral  PainSc: Noank

## 2022-03-24 NOTE — Anesthesia Procedure Notes (Signed)
Procedure Name: Intubation Date/Time: 03/24/2022 2:35 PM  Performed by: Lavell Luster, CRNAPre-anesthesia Checklist: Patient identified, Emergency Drugs available, Suction available and Patient being monitored Patient Re-evaluated:Patient Re-evaluated prior to induction Oxygen Delivery Method: Circle system utilized Preoxygenation: Pre-oxygenation with 100% oxygen Induction Type: IV induction Ventilation: Mask ventilation without difficulty Laryngoscope Size: Mac and 4 Tube size: 7.0 mm Number of attempts: 2 Airway Equipment and Method: Stylet Secured at: 22 cm Tube secured with: Tape Dental Injury: Teeth and Oropharynx as per pre-operative assessment

## 2022-03-24 NOTE — Progress Notes (Signed)
Patient evaluated in our postprocedural recovery area. Patient states she is having 5-6 out of 10 pain which is slightly elevated compared to what she was preprocedure but she says it feels different than her typical pain. She has been administered 25 mcg of fentanyl and had some improvement in her pain down to a level 3-4 out of 10. She is going to undergo chest x-ray and KUB to rule out perforation. She should be treated as possible postprocedural pain versus pancreatitis and I am writing her for IV fluids to be continued over the course of the next 12 hours.  She can be on clear liquid diet and advance her diet as able depending on the findings at the time of the chest x-ray and KUB.    If she has negative imaging but has progressive worsening of pain she may need a CAT scan overnight.   Justice Britain, MD [facility] Office # [phone]

## 2022-03-24 NOTE — Care Management Important Message (Signed)
Important Message  Patient Details  Name: April [name] MRN: [id number] Date of Birth: 10/07/1975   Medicare Important Message Given:  Yes     Hannah Beat 03/24/2022, 12:03 PM

## 2022-03-24 NOTE — Anesthesia Preprocedure Evaluation (Addendum)
Anesthesia Evaluation  Patient identified by MRN, date of birth, ID band Patient awake    Reviewed: Allergy & Precautions, NPO status , Patient's Chart, lab work & pertinent test results  Airway Mallampati: I  TM Distance: >3 FB Neck ROM: Full    Dental  (+) Chipped, Dental Advisory Given,    Pulmonary Current Smoker and Patient abstained from smoking.   Pulmonary exam normal breath sounds clear to auscultation       Cardiovascular hypertension, Pt. on medications Normal cardiovascular exam Rhythm:Regular Rate:Normal     Neuro/Psych negative neurological ROS  negative psych ROS   GI/Hepatic negative GI ROS, Neg liver ROS,,,  Endo/Other  negative endocrine ROS    Renal/GU negative Renal ROS  negative genitourinary   Musculoskeletal negative musculoskeletal ROS (+)    Abdominal   Peds  Hematology negative hematology ROS (+)   Anesthesia Other Findings Stage IV colon CA with biliary obstruction  Reproductive/Obstetrics                             Anesthesia Physical Anesthesia Plan  ASA: 2  Anesthesia Plan: General   Post-op Pain Management: Minimal or no pain anticipated   Induction: Intravenous  PONV Risk Score and Plan: 2 and Midazolam, Dexamethasone and Ondansetron  Airway Management Planned: Oral ETT  Additional Equipment:   Intra-op Plan:   Post-operative Plan: Extubation in OR  Informed Consent: I have reviewed the patients History and Physical, chart, labs and discussed the procedure including the risks, benefits and alternatives for the proposed anesthesia with the patient or authorized representative who has indicated his/her understanding and acceptance.     Dental advisory given  Plan Discussed with: CRNA  Anesthesia Plan Comments:        Anesthesia Quick Evaluation

## 2022-03-24 NOTE — Interval H&P Note (Signed)
History and Physical Interval Note:  03/24/2022 1:58 PM  April [name]  has presented today for surgery, with the diagnosis of biliary obstruction, metatatic colon cancer.  The various methods of treatment have been discussed with the patient and family. After consideration of risks, benefits and other options for treatment, the patient has consented to  Procedure(s): ENDOSCOPIC RETROGRADE CHOLANGIOPANCREATOGRAPHY (ERCP) (N/A) as a surgical intervention.  The patient's history has been reviewed, patient examined, no change in status, stable for surgery.  I have reviewed the patient's chart and labs.  Questions were answered to the patient's satisfaction.    The risks of an ERCP were discussed at length, including but not limited to the risk of perforation, bleeding, abdominal pain, post-ERCP pancreatitis (while usually mild can be severe and even life threatening).   This is a more complex hilar lesion that his jailed off both the left and right main hepatic ducts based on MRI imaging.  Her distal bile duct is quite normal sized so I think that there could be some difficulty with placement of 2 separate stents but if we are able to access the biliary tree hopefully we can drain 1 or both segments of the liver and see if this makes a difference.  If it does not then interventional radiology may be required for additional evaluation/management.  Time will tell.      [employer]

## 2022-03-24 NOTE — Op Note (Addendum)
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 Providers:                Justice Britain, MD, Amorina Doerr Earing, RN,                            William Dalton, Technician Referring MD:             Izola Price. Benay Spice, inpatient medical service, Yetta Flock, MD Medicines:                General Anesthesia, Unasyn 1.5 g IV, Diclofenac 100                            mg rectal, Glucagon [id number] mg IV Complications:            No immediate complications. Estimated Blood Loss:     Estimated blood loss was minimal. Procedure:                Pre-Anesthesia Assessment:                           - Prior to the procedure, a History and Physical                            was performed, and patient medications and                            allergies were reviewed. The patient's tolerance of                            previous anesthesia was also reviewed. The risks                            and benefits of the procedure and the sedation                            options and risks were discussed with the patient.  All questions were answered, and informed consent                            was obtained. Prior Anticoagulants: The patient has                            taken Lovenox (enoxaparin), last dose was 2 days                            prior to procedure. ASA Grade Assessment: III - A                            patient with severe systemic disease. After                             reviewing the risks and benefits, the patient was                            deemed in satisfactory condition to undergo the                            procedure.                           After obtaining informed consent, the scope was                            passed under direct vision. Throughout the                            procedure, the patient's blood pressure, pulse, and                            oxygen saturations were monitored continuously. The                            [employer] single use                            duodenoscope was introduced through the mouth, and                            used to inject contrast into and used to inject                            contrast into the bile duct and ventral pancreatic                            duct. The ERCP was technically difficult and                            complex due to difficulty passing guidewires  through biliary ductal stenosis. Successful                            completion of the procedure was aided by performing                            the maneuvers documented (below) in this report.                            The patient tolerated the procedure. Scope In: Scope Out: Findings:      The scout film was normal.      The upper GI tract was traversed under direct vision without detailed       examination. LA Grade C (one or more mucosal breaks continuous between       tops of 2 or more mucosal folds, less than 75% circumference)       esophagitis with no bleeding was found at the gastroesophageal junction.       A 2 cm hiatal hernia was present. Patchy mildly erythematous mucosa       without bleeding was found in the entire examined stomach - biopsied for       H. pylori evaluation. No gross lesions were noted in the duodenal bulb,       in the first portion of the duodenum and in the second portion of the       duodenum. The major  papilla was normal.      On first attempt at biliary cannulation while using a wire-guided       approach this led to placement of the wire within the pancreatic duct.       Decision was made to pursue a double-wire approach. The 0.035 inch short       soft Jagwire wire was left within the pancreatic duct.      A second short 0.035 inch Soft Jagwire was passed into the biliary tree       into what was presumed to be the right hepatic duct system.      One 4 Fr by 5 cm temporary plastic pancreatic stent with a single       external pigtail was placed into the ventral pancreatic duct. The stent       was in good position.      After significant maneuvering of the sphincterotome, another short 0.035       inch Soft Jagwire was passed into the presumed left hepatic biliary       tree. The Hydratome sphincterotome was passed over the guidewire and the       bile duct was then deeply cannulated. Contrast was injected. I       personally interpreted the bile duct images. Ductal flow of contrast was       adequate. Image quality was adequate. Contrast extended to the hepatic       ducts. Opacification of the entire biliary tree except for the cystic       duct and gallbladder was successful. The maximum diameter of the common       bile duct was 5 mm. The left and right hepatic ducts separately (Bismuth       II) contained a single severe stenosis 15 mm in length. The left and       right hepatic ducts and all intrahepatic  branches were moderately       dilated, secondary to aforementioned stricture. A 6 mm biliary       sphincterotomy was made with a monofilament Hydratome sphincterotome       using ERBE electrocautery. There was no post-sphincterotomy bleeding. An       occlusion cholangiogram was performed that showed the significant       stenosis. Cells for cytology were obtained by brushing in the LHD/CHD       stricture and the RHD/CHD stricture. The LHD/CHD and RHD/CHD strictures        were successfully dilated with a HurriCaine 4 mm balloon dilator. After       careful measuring, it was felt that long stenting was required due to       her significantly long bile duct. One 8.5 Fr by 15 cm plastic biliary       stent with a single external flap and a single internal flap was placed       into the LHD. The stent was in good position. One 7 Fr by 15 cm plastic       biliary stent with a single external flap and a single internal flap was       placed into the RHD. The stent was in good position.      A pancreatogram was not performed.      The duodenoscope was withdrawn from the patient. Impression:               - LA Grade C esophagitis with no bleeding.                           - 2 cm hiatal hernia.                           - Erythematous mucosa in the stomach. Biopsied for                            HP evaluation.                           - No gross lesions in the duodenal bulb, in the                            first portion of the duodenum and in the second                            portion of the duodenum.                           - The major papilla appeared normal.                           - A biliary sphincterotomy was performed.                           - Severe biliary stricturing was found in the                            hepatic duct system affecting both the LHD/CHD and  the RHD/CHD and bifurcation. This stricturing was                            malignant appearing. The stricturing was dilated to                            4 mm to allow for tool passage.                           - The left and right hepatic ducts and all                            intrahepatic branches were moderately dilated,                            secondary to stricturing.                           - Cells for cytology obtained from the LHD/CHD and                            RHD/CHD stricture.                           - One temporary plastic  pancreatic stent was placed                            into the ventral pancreatic duct to decrease PEP. Recommendation:           - The patient will be observed post-procedure,                            until all discharge criteria are met.                           - Return patient to hospital ward for ongoing care.                           - Advance diet as tolerated.                           - Observe patient's clinical course.                           - Initiate pantoprazole 40 mg once twice daily for                            2 months then once daily.                           - Check liver enzymes (AST, ALT, alkaline                            phosphatase, bilirubin) in the morning.                           -  Patient will need a KUB 2-view in 10-14 days to                            ensure pancreatic stent has migrated successfully.                            If still present at that time will need to be                            scheduled for EGD with stent pull.                           - Repeat ERCP in 3-4 months to exchange stents to                            permanent stents, if patient does have adequate                            biliary drainage noted.                           - If her LFTs do not improve, will need to consider                            Perctuaneous biliary drainage approach.                           - Antibiotics for 5-days due to hilar stricturing                            to decrease post interventional infectious risk.                           - Watch for pancreatitis, bleeding, perforation,                            and cholangitis.                           - The findings and recommendations were discussed                            with the patient.                           - The findings and recommendations were discussed                            with the referring physician. Procedure Code(s):        --- Professional ---                            [phone], Endoscopic retrograde  cholangiopancreatography (ERCP); with placement of                            endoscopic stent into biliary or pancreatic duct,                            including pre- and post-dilation and guide wire                            passage, when performed, including sphincterotomy,                            when performed, each stent                           [id number], 10, Endoscopic retrograde                            cholangiopancreatography (ERCP); with placement of                            endoscopic stent into biliary or pancreatic duct,                            including pre- and post-dilation and guide wire                            passage, when performed, including sphincterotomy,                            when performed, each stent                           [id number], 63, Endoscopic retrograde                            cholangiopancreatography (ERCP); with placement of                            endoscopic stent into biliary or pancreatic duct,                            including pre- and post-dilation and guide wire                            passage, when performed, including sphincterotomy,                            when performed, each stent                           [id number], 59, Endoscopic retrograde                            cholangiopancreatography (ERCP); with  trans-endoscopic balloon dilation of                            biliary/pancreatic duct(s) or of ampulla                            (sphincteroplasty), including sphincterotomy, when                            performed, each duct                           43277, 73, Endoscopic retrograde                            cholangiopancreatography (ERCP); with                            trans-endoscopic balloon dilation of                            biliary/pancreatic duct(s) or of ampulla                             (sphincteroplasty), including sphincterotomy, when                            performed, each duct                           [id number], 26, Endoscopic catheterization of the                            biliary ductal system, radiological supervision and                            interpretation Diagnosis Code(s):        --- Professional ---                           K20.90, Esophagitis, unspecified without bleeding                           K44.9, Diaphragmatic hernia without obstruction or                            gangrene                           K31.89, Other diseases of stomach and duodenum                           K83.1, Obstruction of bile duct                           R17, Unspecified jaundice                           R93.2, Abnormal findings  on diagnostic imaging of                            liver and biliary tract CPT copyright 2022 [employer]. All rights reserved. The codes documented in this report are preliminary and upon coder review may  be revised to meet current compliance requirements. Justice Britain, MD 03/24/2022 4:31:19 PM Number of Addenda: 0

## 2022-03-24 NOTE — Progress Notes (Signed)
Progress Note   Subjective  Hospital day #4 Chief Complaint: Abdominal pain, abnormal LFTs metastatic colon cancer  Today, patient is sitting in a chair outside of her bed well as her bed is being made.  She tells me that occasionally she will have abdominal pain but this is nothing new.  She is aware of plans for tentative ERCP and that we are not sure on timing yet.  She does have some questions about the procedure but in general is ready to do what ever we think is best for her.   Objective   Vital signs in last 24 hours: Temp:  [98.4 F (36.9 C)-98.7 F (37.1 C)] 98.4 F (36.9 C) (03/11 0843) Pulse Rate:  [87-110] 93 (03/11 0843) Resp:  [16-20] 18 (03/11 0843) BP: (138-158)/(106-128) 149/109 (03/11 0843) SpO2:  [99 %-100 %] 99 % (03/11 0843) Last BM Date :  (more than 1 week) General:    Pleasant AA female in NAD Heart:  Regular rate and rhythm; no murmurs Lungs: Respirations even and unlabored, lungs CTA bilaterally Abdomen:  Soft, nontender and nondistended. Normal bowel sounds. Psych:  Cooperative. Normal mood and affect.  Lab Results: Recent Labs    03/21/22 1310 03/22/22 0350  WBC 6.0 6.5  HGB 14.3 13.7  HCT 41.2 38.7  PLT 226 206   BMET Recent Labs    03/22/22 0350 03/23/22 0428 03/24/22 0251  NA 136 135 133*  K 3.1* 3.5 3.1*  CL 98 98 93*  CO2 '24 25 29  '$ GLUCOSE 90 86 99  BUN '7 6 10  '$ CREATININE 0.59 0.59 0.73  CALCIUM 9.1 9.7 9.6      Latest Ref Rng & Units 03/24/2022    2:51 AM 03/23/2022    4:28 AM 03/22/2022    3:50 AM  Hepatic Function  Total Protein 6.5 - 8.1 g/dL 7.1  7.2  6.5   Albumin 3.5 - 5.0 g/dL 3.3  3.3  3.2   AST 15 - 41 U/L 403  282  267   ALT 0 - 44 U/L 388  367  364   Alk Phosphatase 38 - 126 U/L 809  755  555   Total Bilirubin 0.3 - 1.2 mg/dL 11.0  9.1  7.2      Studies/Results: MR ABDOMEN MRCP W WO CONTAST  Result Date: 03/23/2022 CLINICAL DATA:  47 year old female with history of metastatic colon cancer. New elevation  of liver function tests. Jaundice. EXAM: MRI ABDOMEN WITHOUT AND WITH CONTRAST (INCLUDING MRCP) TECHNIQUE: Multiplanar multisequence MR imaging of the abdomen was performed both before and after the administration of intravenous contrast. Heavily T2-weighted images of the biliary and pancreatic ducts were obtained, and three-dimensional MRCP images were rendered by post processing. CONTRAST:  4m GADAVIST GADOBUTROL 1 MMOL/ML IV SOLN COMPARISON:  No prior abdominal MRI. CT the abdomen and pelvis 01/30/2022. FINDINGS: Lower chest: Unremarkable. Hepatobiliary: MRCP images demonstrate severe intrahepatic biliary ductal dilatation. Distal common hepatic bile duct is normal in caliber measuring 5 mm. Common bile duct measures 4 mm in the porta hepatis. Coronal MRCP image 95 of series 8 demonstrates a gap of approximately 1.1 cm between the most proximal patent portion of the common hepatic duct and the remaining patent and dilated intrahepatic biliary tree. On postcontrast images this area is remarkable for a mass-like region of heterogeneous hypoenhancement on early arterial phase imaging which becomes a more mass-like confluent area of enhancement on post gadolinium delayed images (axial image 56 of series 1104), estimated  to measure approximately 2.5 x 1.6 cm. This area demonstrates diffusion restriction. No filling defects in the more distal aspect of the common bile duct are noted to suggest choledocholithiasis. Gallbladder is only moderately distended but contains material that is intermediate to high signal intensity on T1 weighted images and slightly low signal intensity on T2 weighted images, likely reflecting biliary sludge. No pericholecystic fluid or surrounding inflammatory changes. Pancreas: No pancreatic mass. No pancreatic ductal dilatation. No pancreatic or peripancreatic fluid collections or inflammatory changes. Spleen:  Unremarkable. Adrenals/Urinary Tract: 1.6 cm T1 hypointense, T2 hyperintense,  nonenhancing lesion in the medial aspect of the interpolar region of the left kidney, compatible with a small simple cyst (no imaging follow-up recommended). Signal voids are present within the collecting systems of both kidneys (upper pole of the left kidney and interpolar region of the right kidney), corresponding to large calculi noted on prior CT examination. No aggressive appearing renal lesions are noted. No hydroureteronephrosis in the visualized portions of the abdomen. Bilateral adrenal glands are normal in appearance. Stomach/Bowel: Visualized portions are unremarkable. Vascular/Lymphatic: No aneurysm identified in the visualized abdominal vasculature. No definite lymphadenopathy noted in the abdomen. Other: Numerous enhancing nodules are noted in the peritoneal cavity, indicative of widespread peritoneal carcinomatosis, largest of which is located inferior to the right lobe of the liver (axial image 82 of series 1102) measuring 2.6 x 1.5 cm (previously 2.4 x 0.9 cm on CT examination 01/30/2022 on axial image 23 of series 2 of that study). Trace volume of perihepatic ascites. Musculoskeletal: No aggressive appearing osseous lesions are noted in the visualized portions of the skeleton. IMPRESSION: 1. Centrally located mass in the liver causing obstruction of the confluence of the intrahepatic bile ducts and the proximal common hepatic duct, resulting in severe intrahepatic biliary ductal dilatation. This mass demonstrates progressively increasing enhancement on delayed images. These imaging findings are most compatible with an aggressive neoplasm such as a cholangiocarcinoma, and are not favored to represent a metastatic lesion. 2. Progressive peritoneal carcinomatosis, as above. 3. Additional incidental findings, as above. These results were discussed by telephone at the time of interpretation on 03/23/2022 at 7:45 AM with provider Dr. Havery Moros, who verbally acknowledged these results. Electronically  Signed   By: Vinnie Langton M.D.   On: 03/23/2022 07:55     Assessment / Plan:    Assessment: 1.  Stage IV sigmoid colon cancer: Currently being treated with Lonsurf/Avastin 2.  Abdominal pain/jaundice/obstructing liver mass: Progressive peritoneal carcinomatosis on MRCP, no evidence of cholangitis, but worsening cholestasis with LFTs continuing to rise  Plan: 1.  CMV/EBV pending 2.  Again plan for possible ERCP this afternoon with Dr. Rush Landmark.  This will be pending anesthesia availability after 3:00.  If we are not able to do procedure today we will schedule for tomorrow.  If we cannot do procedure today we will add the patient to be back on a diet.  N.p.o. at midnight. 3.  Currently Lovenox is on hold 4.  Further recommendations to follow procedure 5.  Did discuss with patient that procedure is very technically difficult.  Goal would be to place 2 stents.  Pending what it looks like when Dr. Rush Landmark gets in there she may also end up with IR intervention.  She verbalized understanding and I answered her questions.  Thank you for your kind consultation, we will continue to follow.    LOS: 3 days   Levin Erp  03/24/2022, 10:46 AM

## 2022-03-25 ENCOUNTER — Telehealth: Payer: Self-pay

## 2022-03-25 DIAGNOSIS — R1013 Epigastric pain: Secondary | ICD-10-CM

## 2022-03-25 DIAGNOSIS — G8929 Other chronic pain: Secondary | ICD-10-CM

## 2022-03-25 DIAGNOSIS — K831 Obstruction of bile duct: Secondary | ICD-10-CM

## 2022-03-25 DIAGNOSIS — K5900 Constipation, unspecified: Secondary | ICD-10-CM

## 2022-03-25 LAB — COMPREHENSIVE METABOLIC PANEL
ALT: 327 U/L — ABNORMAL HIGH (ref 0–44)
AST: 230 U/L — ABNORMAL HIGH (ref 15–41)
Albumin: 3.2 g/dL — ABNORMAL LOW (ref 3.5–5.0)
Alkaline Phosphatase: 880 U/L — ABNORMAL HIGH (ref 38–126)
Anion gap: 11 (ref 5–15)
BUN: 13 mg/dL (ref 6–20)
CO2: 28 mmol/L (ref 22–32)
Calcium: 9.7 mg/dL (ref 8.9–10.3)
Chloride: 94 mmol/L — ABNORMAL LOW (ref 98–111)
Creatinine, Ser: 0.85 mg/dL (ref 0.44–1.00)
GFR, Estimated: >60 mL/min (ref >60)
Glucose, Bld: 112 mg/dL — ABNORMAL HIGH (ref 70–99)
Potassium: 4.5 mmol/L (ref 3.5–5.1)
Sodium: 133 mmol/L — ABNORMAL LOW (ref 135–145)
Total Bilirubin: 4.7 mg/dL — ABNORMAL HIGH (ref 0.3–1.2)
Total Protein: 7.2 g/dL (ref 6.5–8.1)

## 2022-03-25 LAB — CBC
HCT: 38.2 % (ref 36.0–46.0)
Hemoglobin: 13.4 g/dL (ref 12.0–15.0)
MCH: 31.8 pg (ref 26.0–34.0)
MCHC: 35.1 g/dL (ref 30.0–36.0)
MCV: 90.7 fL (ref 80.0–100.0)
Platelets: 252 10*3/uL (ref 150–400)
RBC: 4.21 MIL/uL (ref 3.87–5.11)
RDW: 14.9 % (ref 11.5–15.5)
WBC: 10.4 10*3/uL (ref 4.0–10.5)
nRBC: 0 % (ref 0.0–0.2)

## 2022-03-25 LAB — CMV DNA, QUANTITATIVE, PCR
CMV DNA Quant: NEGATIVE IU/mL
Log10 CMV Qn DNA Pl: UNDETERMINED log10 IU/mL

## 2022-03-25 LAB — LIPASE, BLOOD: Lipase: 35 U/L (ref 11–51)

## 2022-03-25 LAB — MAGNESIUM: Magnesium: 2 mg/dL (ref 1.7–2.4)

## 2022-03-25 LAB — C-REACTIVE PROTEIN: CRP: <0.5 mg/dL (ref <1.0)

## 2022-03-25 LAB — AMYLASE: Amylase: 58 U/L (ref 28–100)

## 2022-03-25 MED ORDER — HYDROMORPHONE HCL 1 MG/ML IJ SOLN: 0.5000 mg | INTRAMUSCULAR | Status: DC | PRN

## 2022-03-25 MED ORDER — HYDROMORPHONE HCL 1 MG/ML IJ SOLN
0.5000 mg | INTRAMUSCULAR | Status: DC | PRN
  Administered 2022-03-25 – 2022-03-27 (×9): 1 mg via INTRAVENOUS
  Filled 2022-03-25 (×10): qty 1

## 2022-03-25 MED ORDER — BISACODYL 5 MG PO TBEC
10.0000 mg | DELAYED_RELEASE_TABLET | Freq: Two times a day (BID) | ORAL | Status: DC
  Administered 2022-03-25 – 2022-03-26 (×2): 10 mg via ORAL
  Filled 2022-03-25 (×3): qty 2

## 2022-03-25 MED ORDER — ALUM & MAG HYDROXIDE-SIMETH 200-200-20 MG/5ML PO SUSP
30.0000 mL | ORAL | Status: DC | PRN
  Administered 2022-03-25: 30 mL via ORAL
  Filled 2022-03-25: qty 30

## 2022-03-25 NOTE — Progress Notes (Signed)
Pt has had a suppository, warm soap enema, and 2 bisacodyl and still unable to pass a stool. Communicated this with MD: "She is still constipated and unable to pass any stool. She asked if a family member could bring in one of her Trulance from home; she says it always works." MD said, "yes, please." MD said would put in a nursing communication that pt can take Trulance home medication.

## 2022-03-25 NOTE — Progress Notes (Signed)
Progress Note   Subjective  Chief Complaint: Abdominal pain, abnormal liver tests and metastatic colon cancer  Patient doing okay this morning, but still having some abdominal pain.  When I see her she is actually on the toilet with the nurse trying to pass a stool.  Says it has been a week and a half since she is actually had a bowel movement.  Per nursing staff she has passed a small amount but she tells me that she is very uncomfortable.  Tells me she is taking Trulance outpatient before which has worked well for her.  Currently here on MiraLAX twice a day and Dulcolax suppositories once daily.   Objective   Vital signs in last 24 hours: Temp:  [97.7 F (36.5 C)-98.6 F (37 C)] 98.1 F (36.7 C) (03/12 0738) Pulse Rate:  [81-116] 81 (03/12 0738) Resp:  [12-25] 16 (03/12 0738) BP: (122-163)/(90-121) 163/121 (03/12 0738) SpO2:  [92 %-99 %] 98 % (03/12 0738) Last BM Date :  (more than 1 week) General:    AA female in mild distress Heart:  Regular rate and rhythm; no murmurs Lungs: Respirations even and unlabored, lungs CTA bilaterally Abdomen: Did not exam as currently on the commode Psych:  Cooperative. Normal mood and affect.  Intake/Output from previous day: 03/11 0701 - 03/12 0700 In: 700 [I.V.:700] Out: -   Lab Results: Recent Labs    03/25/22 0118  WBC 10.4  HGB 13.4  HCT 38.2  PLT 252   BMET Recent Labs    03/23/22 0428 03/24/22 0251 03/25/22 0118  NA 135 133* 133*  K 3.5 3.1* 4.5  CL 98 93* 94*  CO2 '25 29 28  '$ GLUCOSE 86 99 112*  BUN '6 10 13  '$ CREATININE 0.59 0.73 0.85  CALCIUM 9.7 9.6 9.7      Latest Ref Rng & Units 03/25/2022    1:18 AM 03/24/2022    2:51 AM 03/23/2022    4:28 AM  Hepatic Function  Total Protein 6.5 - 8.1 g/dL 7.2  7.1  7.2   Albumin 3.5 - 5.0 g/dL 3.2  3.3  3.3   AST 15 - 41 U/L 230  403  282   ALT 0 - 44 U/L 327  388  367   Alk Phosphatase 38 - 126 U/L 880  809  755   Total Bilirubin 0.3 - 1.2 mg/dL 4.7  11.0  9.1        Studies/Results: DG Abd 2 Views  Result Date: 03/24/2022 CLINICAL DATA:  Abdominal pain EXAM: ABDOMEN - 2 VIEW COMPARISON:  Abdominal series 03/21/2022. CT abdomen and pelvis 01/30/2022. FINDINGS: Two new biliary stents are in place. There is no free intraperitoneal air. Bilateral renal calculi appear unchanged. Bowel-gas pattern is nonobstructive. There is moderate gaseous distention of the stomach. Lung bases are clear. No fractures are seen. IMPRESSION: 1. Two new biliary stents are in place. 2. Bilateral renal calculi appear unchanged. 3. Moderate gaseous distention of the stomach. No bowel obstruction. Electronically Signed   By: Ronney Asters M.D.   On: 03/24/2022 17:17   DG CHEST PORT 1 VIEW  Result Date: 03/24/2022 CLINICAL DATA:  Abdominal pain EXAM: PORTABLE CHEST 1 VIEW COMPARISON:  Portable exam 1649 hours compared to 07/05/2021 FINDINGS: Normal heart size, mediastinal contours, and pulmonary vascularity. Lungs clear. No pulmonary infiltrate, pleural effusion, pneumothorax, or acute osseous findings. IMPRESSION: No acute abnormalities. Electronically Signed   By: Lavonia Dana M.D.   On: 03/24/2022 17:12  DG ERCP  Result Date: 03/24/2022 CLINICAL DATA:  ERCP. 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 EXAM: ERCP TECHNIQUE: Multiple spot images obtained with the fluoroscopic device and submitted for interpretation post-procedure. FLUOROSCOPY TIME: FLUOROSCOPY TIME 8 minutes, 28 seconds (71.2 mGy) COMPARISON:  MRCP-03/22/2022 FINDINGS: 19 spot intraoperative fluoroscopic images of the right upper abdominal quadrant during ERCP are provided for review. Initial image demonstrates an ERCP probe overlying the right upper abdominal quadrant. Subsequent images demonstrate selective cannulation of the pancreatic duct, ultimately with pancreatic stent placement. Subsequent images demonstrate selective cannulation and opacification of the common bile duct. There is abrupt  occlusion of the central aspect of the common hepatic duct at the level of the biliary hilum with faint opacification of the intrahepatic biliary tree which appears moderately dilated. Completion images demonstrate placement of parallel positioned CBD stents, one of which appears positioned within the central aspect of the right intrahepatic biliary tree while the other appears positioned within the central aspect of the left intrahepatic biliary tree. IMPRESSION: ERCP with pancreatic stent and parallel placed CBD stents traversing abrupt occlusion of the central aspect of the common hepatic duct. These images were submitted for radiologic interpretation only. Please see the procedural report for the amount of contrast and the fluoroscopy time utilized. Electronically Signed   By: Sandi Mariscal M.D.   On: 03/24/2022 16:21       Assessment / Plan:   Assessment: 1.  Stage IV sigmoid colon cancer 2.  Abdominal pain/jaundice and obstructing liver mass with progressive peritoneal carcinomatosis: Status post ERCP 03/24/2022 with LA grade C esophagitis, erythematous mucosa in the stomach, biliary sphincterotomy performed, severe biliary stricture was found in the [place] duct system which was malignant appearing, temporary plastic pancreatic stent was placed in the ventral pancreatic duct, LFTs trending down today 3. Constipation: no bowel movement in 1.5 weeks, straining now, Mirlax BID and Dulcolax supp daily not enough, mentioned using Trulance outpatient  Plan: 1.  Continue to check LFTs while in the hospital 2.  Continue Pantoprazole 40 mg p.o. twice daily for 2 months, then once daily 3.  Repeat ERCP in 3 to 4 months for exchange of stent/permanent stents 4.  Antibiotics for total of 5 days due to hilar stricturing to decrease post interventional infectious risk 5.  KUB 2 view in 10 to 14 days to ensure pancreatic stent has migrated successfully, if still present at that time will need to be scheduled for  EGD with stent pull 6.  Discussed pain with hospitalist team, they are going to give her some Dilaudid as an option and are going to start Relistor for constipation 7.  Also ordered Dulcolax stool softeners '10mg'$  po twice daily for the next few days, could consider SMOG enema if unsuccessful  Thank for your kind consultation, we will continue to follow.   LOS: 4 days   Levin Erp  03/25/2022, 2:45 PM

## 2022-03-25 NOTE — Progress Notes (Signed)
  Progress Note   Patient: April [name] IRC:[id number] DOB: [birth year] DOA: 03/21/2022     4 DOS: the patient was seen and examined on 03/25/2022   Brief hospital course: 47 y.o. 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 Benay Spice), hypertension, recurrent bouts of nephrolithiasis with multiple urologic interventions at [facility]rology who presents as a direct admission from [facility] health cancer center for abnormal LFTs and hyperbilirubinemia. GI consulted  Assessment and Plan: * Hyperbilirubinemia Abnormal LFTs  -Presented with elevated alk phos to 600's with AST/ALT elevation to the 300-400's, all of which was normal 03/03/2022. -MRCP reviewed. Concerns for centrally located mass in the liver causing obstruction of the confluence of intrahepatic ducts. Findings suggestive of cholangiocarcinoma -GI was consulted and pt is now s/p ERCP s/p biliary sphincterotomy with findings of malignant appearing stricture which was dilated with plastic stent placed on 3/11 -Appreciate recs by Dr. Benay Spice. 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 currently (on Avastin and Lonsurf, most recent doses 03/03/2022) -Now with concerns of malignant biliary stricture per above -Per Oncology, rec for salvage fruquintinib as outpt   Hypokalemia -corrected -Recheck bmet in AM   Hypertension -continue amlodipine, hydralazine, metoprolol and HCTZ -added PRN hydralazine   Subjective: Eager to advance diet this AM  Physical Exam: Vitals:   03/24/22 2103 03/25/22 0535 03/25/22 0738 03/25/22 1641  BP: (!) 153/112 (!) 152/101 (!)  163/121 (!) 138/108  Pulse: 92 83 81 95  Resp:   16 17  Temp: 98.6 F (37 C) 98.2 F (36.8 C) 98.1 F (36.7 C) 97.9 F (36.6 C)  TempSrc: Oral Oral Oral Oral  SpO2: 98% 99% 98% 100%  Height:       General exam: Awake, laying in bed, in nad Respiratory system: Normal respiratory effort, no wheezing Cardiovascular system: regular rate, s1, s2 Gastrointestinal system: Soft, nondistended, positive BS Central nervous system: CN2-12 grossly intact, strength intact Extremities: Perfused, no clubbing Skin: Normal skin turgor, no notable skin lesions seen Psychiatry: Mood normal // no visual hallucinations   Data Reviewed:  Labs reviewed: Na 133, K 4.5, Cr 0.85, AST 230, ALT 327, WBC 10.4, Hgb 13.4, Plts 252  Family Communication: Pt in room, family not at bedside  Disposition: Status is: Inpatient Remains inpatient appropriate because: Severity of illness  Planned Discharge Destination: Home    Author: Marylu Lund, MD 03/25/2022 6:09 PM  For on call review [email or web address].

## 2022-03-25 NOTE — Telephone Encounter (Signed)
-----   Message from Irving Copas., MD sent at 03/24/2022  5:15 PM EDT ----- Regarding: Follow-up pancreatic stent April [name], This patient needs a KUB and LFTs in 2 weeks. ERCP for stent exchange in 3 to 4 months pending she does well. Thanks. GM

## 2022-03-25 NOTE — Telephone Encounter (Signed)
Order for KUB and LFT entered  Recall entered for ERCP

## 2022-03-26 ENCOUNTER — Telehealth: Payer: Self-pay | Admitting: *Deleted

## 2022-03-26 ENCOUNTER — Encounter: Payer: Self-pay | Admitting: Gastroenterology

## 2022-03-26 ENCOUNTER — Other Ambulatory Visit: Payer: Self-pay | Admitting: *Deleted

## 2022-03-26 DIAGNOSIS — C787 Secondary malignant neoplasm of liver and intrahepatic bile duct: Secondary | ICD-10-CM

## 2022-03-26 DIAGNOSIS — C189 Malignant neoplasm of colon, unspecified: Secondary | ICD-10-CM

## 2022-03-26 DIAGNOSIS — K5909 Other constipation: Secondary | ICD-10-CM

## 2022-03-26 DIAGNOSIS — C786 Secondary malignant neoplasm of retroperitoneum and peritoneum: Secondary | ICD-10-CM

## 2022-03-26 DIAGNOSIS — E43 Unspecified severe protein-calorie malnutrition: Secondary | ICD-10-CM | POA: Insufficient documentation

## 2022-03-26 LAB — COMPREHENSIVE METABOLIC PANEL
ALT: 230 U/L — ABNORMAL HIGH (ref 0–44)
AST: 96 U/L — ABNORMAL HIGH (ref 15–41)
Albumin: 3.1 g/dL — ABNORMAL LOW (ref 3.5–5.0)
Alkaline Phosphatase: 708 U/L — ABNORMAL HIGH (ref 38–126)
Anion gap: 12 (ref 5–15)
BUN: 15 mg/dL (ref 6–20)
CO2: 29 mmol/L (ref 22–32)
Calcium: 9.2 mg/dL (ref 8.9–10.3)
Chloride: 87 mmol/L — ABNORMAL LOW (ref 98–111)
Creatinine, Ser: 0.64 mg/dL (ref 0.44–1.00)
GFR, Estimated: >60 mL/min (ref >60)
Glucose, Bld: 139 mg/dL — ABNORMAL HIGH (ref 70–99)
Potassium: 3 mmol/L — ABNORMAL LOW (ref 3.5–5.1)
Sodium: 128 mmol/L — ABNORMAL LOW (ref 135–145)
Total Bilirubin: 3 mg/dL — ABNORMAL HIGH (ref 0.3–1.2)
Total Protein: 7.1 g/dL (ref 6.5–8.1)

## 2022-03-26 LAB — BASIC METABOLIC PANEL
Anion gap: 11 (ref 5–15)
BUN: 16 mg/dL (ref 6–20)
CO2: 29 mmol/L (ref 22–32)
Calcium: 9.9 mg/dL (ref 8.9–10.3)
Chloride: 92 mmol/L — ABNORMAL LOW (ref 98–111)
Creatinine, Ser: 0.72 mg/dL (ref 0.44–1.00)
GFR, Estimated: >60 mL/min (ref >60)
Glucose, Bld: 117 mg/dL — ABNORMAL HIGH (ref 70–99)
Potassium: 3.4 mmol/L — ABNORMAL LOW (ref 3.5–5.1)
Sodium: 132 mmol/L — ABNORMAL LOW (ref 135–145)

## 2022-03-26 LAB — SURGICAL PATHOLOGY

## 2022-03-26 LAB — CBC
HCT: 37 % (ref 36.0–46.0)
Hemoglobin: 12.8 g/dL (ref 12.0–15.0)
MCH: 31.6 pg (ref 26.0–34.0)
MCHC: 34.6 g/dL (ref 30.0–36.0)
MCV: 91.4 fL (ref 80.0–100.0)
Platelets: 268 10*3/uL (ref 150–400)
RBC: 4.05 MIL/uL (ref 3.87–5.11)
RDW: 15 % (ref 11.5–15.5)
WBC: 10.2 10*3/uL (ref 4.0–10.5)
nRBC: 0 % (ref 0.0–0.2)

## 2022-03-26 LAB — CANCER ANTIGEN 19-9: CA 19-9: 213 U/mL — ABNORMAL HIGH (ref 0–35)

## 2022-03-26 LAB — CEA: CEA: <0.6 ng/mL (ref 0.0–4.7)

## 2022-03-26 MED ORDER — POTASSIUM CHLORIDE CRYS ER 20 MEQ PO TBCR
40.0000 meq | EXTENDED_RELEASE_TABLET | Freq: Once | ORAL | Status: AC
  Administered 2022-03-26: 40 meq via ORAL
  Filled 2022-03-26: qty 2

## 2022-03-26 MED ORDER — ADULT MULTIVITAMIN W/MINERALS CH
1.0000 | ORAL_TABLET | Freq: Every day | ORAL | Status: DC
  Administered 2022-03-27: 1 via ORAL
  Filled 2022-03-26: qty 1

## 2022-03-26 MED ORDER — HYDRALAZINE HCL 25 MG PO TABS
25.0000 mg | ORAL_TABLET | Freq: Three times a day (TID) | ORAL | Status: DC
  Administered 2022-03-26 – 2022-03-27 (×4): 25 mg via ORAL
  Filled 2022-03-26 (×5): qty 1

## 2022-03-26 MED ORDER — ENSURE ENLIVE PO LIQD
237.0000 mL | Freq: Three times a day (TID) | ORAL | Status: DC
  Administered 2022-03-27 (×2): 237 mL via ORAL

## 2022-03-26 MED ORDER — SODIUM CHLORIDE 0.9 % IV BOLUS
500.0000 mL | Freq: Once | INTRAVENOUS | Status: AC
  Administered 2022-03-26: 500 mL via INTRAVENOUS

## 2022-03-26 MED ORDER — GLYCERIN (LAXATIVE) 2 G RE SUPP
1.0000 | Freq: Once | RECTAL | Status: DC
  Filled 2022-03-26: qty 1

## 2022-03-26 MED ORDER — SIMETHICONE 80 MG PO CHEW
80.0000 mg | CHEWABLE_TABLET | Freq: Four times a day (QID) | ORAL | Status: DC | PRN
  Administered 2022-03-26 – 2022-03-27 (×2): 80 mg via ORAL
  Filled 2022-03-26 (×2): qty 1

## 2022-03-26 NOTE — Telephone Encounter (Signed)
Thinks she is being d/c'd today or tomorrow. Asking for Dr. Benay Spice to send in her script for hydrocodone 10/325 to [facility] on [address] Hospital MD said he can only order her a 3 day supply.

## 2022-03-26 NOTE — Progress Notes (Signed)
PROGRESS NOTE    April [name]  [id number] DOB: [birth year] DOA: 03/21/2022 PCP: Orma Render, NP   Brief Narrative: April [name] is a 47 y.o. 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 Benay Spice), hypertension, recurrent bouts of nephrolithiasis with multiple urologic interventions at [facility] who presents as a direct admission from [facility] health cancer center for abnormal LFTs and hyperbilirubinemia. GI consulted. Patient found to have multiple malignant strictures on ERCP and underwent dilation and pancreatic duct stenting.   Assessment and Plan:  Metastatic biliary stricture Intrahepatic biliary obstruction Secondary to previously known metastatic colorectal cancer. [facility] consulted and performed ERCP on 3/11 with stricture dilation and plastic pancreatic stent placement. -GI recommendations: continue to observe inpatient with possible need for repeat intervention  Metastatic colorectal cancer Carcinomatosis Patient follows with Dr. Benay Spice as an outpatient. Therapy currently on hold.  Hypokalemia Possibly secondary to poor oral intake, complicated by hydrochlorothiazide use. -IV fluids -Discontinue hydrochlorothiazide   Hyponatremia Possibly secondary to poor oral intake, complicated by hydrochlorothiazide use. -IV fluids -Discontinue hydrochlorothiazide   LA grade C esophagitis -Continue Protonix  Primary hypertension -Continue amlodipine and metoprolol -Discontinue hydrochlorothiazide -Increase to hydralazine 25 mg TID   DVT prophylaxis: SCDs Code Status:   Code Status: Full Code Family Communication: None at bedside Disposition Plan: Discharge home likely in 1-2 days pending continue GI recommendations   Consultants:  [facility]  Procedures:  3/11: ERCP  Antimicrobials: Augmentin     Subjective: Patient reports feeling better. Some abdominal pressure with desire to have a bowel movement, however she has not had a bowel movement yet.  Objective: BP (!) 137/100 (BP Location: Left Arm)   Pulse (!) 106   Temp 97.6 F (36.4 C) (Oral)   Resp 18   Ht '5\' 4"'$  (1.626 m)   LMP 01/07/2006   SpO2 98%   BMI 26.43 kg/m   Examination:  General exam: Appears calm and comfortable Respiratory system: Clear to auscultation. Respiratory effort normal. Cardiovascular system: S1 & S2 heard, RRR. No murmurs, rubs, gallops or clicks. Gastrointestinal system: Abdomen is nondistended, soft and with epigastric tenderness. Normal bowel sounds heard. Central nervous system: Alert and oriented. No focal neurological deficits. Musculoskeletal: No edema. No calf tenderness Skin: No cyanosis. No rashes Psychiatry: Judgement and insight appear normal. Mood & affect appropriate.    Data Reviewed: I have personally reviewed following labs and imaging studies  CBC Lab Results  Component Value Date   WBC 10.2 03/26/2022   RBC 4.05 03/26/2022   HGB 12.8 03/26/2022   HCT 37.0 03/26/2022   MCV 91.4 03/26/2022   MCH 31.6 03/26/2022   PLT 268 03/26/2022   MCHC 34.6 03/26/2022   RDW 15.0 03/26/2022   LYMPHSABS 1.8 03/21/2022   MONOABS 0.5 03/21/2022   EOSABS 0.5 03/21/2022   BASOSABS 0.0 [id number]     Last metabolic panel Lab Results  Component Value Date   NA 132 (L) 03/26/2022   K 3.4 (L) 03/26/2022   CL 92 (L) 03/26/2022   CO2 29 03/26/2022   BUN 16 03/26/2022   CREATININE 0.72 03/26/2022   GLUCOSE 117 (H) 03/26/2022   GFRNONAA >60 03/26/2022   GFRAA >60 10/17/2019   CALCIUM 9.9 03/26/2022   PHOS 3.3 01/07/2018   PROT 7.1 03/26/2022   ALBUMIN 3.1 (L) 03/26/2022   BILITOT 3.0 (H) 03/26/2022   ALKPHOS 708 (H) 03/26/2022   AST  96 (H) 03/26/2022   ALT 230 (H) 03/26/2022   ANIONGAP 11 03/26/2022    GFR: Estimated Creatinine Clearance: 82.5 mL/min (by C-G formula  based on SCr of 0.72 mg/dL).  No results found for this or any previous visit (from the past 240 hour(s)).    Radiology Studies: DG Abd 2 Views  Result Date: 03/24/2022 CLINICAL DATA:  Abdominal pain EXAM: ABDOMEN - 2 VIEW COMPARISON:  Abdominal series 03/21/2022. CT abdomen and pelvis 01/30/2022. FINDINGS: Two new biliary stents are in place. There is no free intraperitoneal air. Bilateral renal calculi appear unchanged. Bowel-gas pattern is nonobstructive. There is moderate gaseous distention of the stomach. Lung bases are clear. No fractures are seen. IMPRESSION: 1. Two new biliary stents are in place. 2. Bilateral renal calculi appear unchanged. 3. Moderate gaseous distention of the stomach. No bowel obstruction. Electronically Signed   By: Ronney Asters M.D.   On: 03/24/2022 17:17   DG CHEST PORT 1 VIEW  Result Date: 03/24/2022 CLINICAL DATA:  Abdominal pain EXAM: PORTABLE CHEST 1 VIEW COMPARISON:  Portable exam 1649 hours compared to 07/05/2021 FINDINGS: Normal heart size, mediastinal contours, and pulmonary vascularity. Lungs clear. No pulmonary infiltrate, pleural effusion, pneumothorax, or acute osseous findings. IMPRESSION: No acute abnormalities. Electronically Signed   By: Lavonia Dana M.D.   On: 03/24/2022 17:12      LOS: 5 days    Cordelia Poche, MD [facility] 03/26/2022, 4:41 PM   If 7PM-7AM, please contact night-coverage [email or web address]

## 2022-03-26 NOTE — Progress Notes (Signed)
Pt had two BM after taking home (approved by MD) med, Trulance.

## 2022-03-26 NOTE — Progress Notes (Signed)
[facility] Progress Note  CC:  Abdominal pain, abnormal liver tests and metastatic colon cancer   Subjective: Still having a lot of abdominal pain that she thinks is related to gas.  Is passing flatus.  No bowel movement in over a week and a half, even after several regimens yesterday.  She says that she really has not eaten anything in 2 weeks.  Someone brought her Trulance from home and she said she took a dose about an hour ago.  Objective:  Vital signs in last 24 hours: Temp:  [97.6 F (36.4 C)-98.1 F (36.7 C)] 97.6 F (36.4 C) (03/13 0746) Pulse Rate:  [95-106] 106 (03/13 0746) Resp:  [16-18] 18 (03/13 0746) BP: (127-138)/(93-108) 137/100 (03/13 0746) SpO2:  [98 %-100 %] 98 % (03/13 0746) Last BM Date :  (more than 1 week) General:  Alert, Well-developed, in NAD Heart:  Slightly tachy but regular rhythm; no murmurs Pulm:  CTAB.  No W/R/R. Abdomen:  Soft, somewhat distended.  BS present and hyperactive.  Diffuse TTP. Extremities:  Without edema. Neurologic:  Alert and oriented x 4;  grossly normal neurologically. Psych:  Alert and cooperative. Normal mood and affect.  Intake/Output from previous day: 03/12 0701 - 03/13 0700 In: 240 [P.O.:240] Out: -   Lab Results: Recent Labs    03/25/22 0118 03/26/22 0038  WBC 10.4 10.2  HGB 13.4 12.8  HCT 38.2 37.0  PLT 252 268   BMET Recent Labs    03/25/22 0118 03/26/22 0038 03/26/22 1428  NA 133* 128* 132*  K 4.5 3.0* 3.4*  CL 94* 87* 92*  CO2 '28 29 29  '$ GLUCOSE 112* 139* 117*  BUN '13 15 16  '$ CREATININE 0.85 0.64 0.72  CALCIUM 9.7 9.2 9.9   LFT Recent Labs    03/26/22 0038  PROT 7.1  ALBUMIN 3.1*  AST 96*  ALT 230*  ALKPHOS 708*  BILITOT 3.0*   DG Abd 2 Views  Result Date: 03/24/2022 CLINICAL DATA:  Abdominal pain EXAM: ABDOMEN - 2 VIEW COMPARISON:  Abdominal series 03/21/2022. CT abdomen and pelvis 01/30/2022. FINDINGS: Two new biliary stents are in place. There is no free  intraperitoneal air. Bilateral renal calculi appear unchanged. Bowel-gas pattern is nonobstructive. There is moderate gaseous distention of the stomach. Lung bases are clear. No fractures are seen. IMPRESSION: 1. Two new biliary stents are in place. 2. Bilateral renal calculi appear unchanged. 3. Moderate gaseous distention of the stomach. No bowel obstruction. Electronically Signed   By: Ronney Asters M.D.   On: 03/24/2022 17:17   DG CHEST PORT 1 VIEW  Result Date: 03/24/2022 CLINICAL DATA:  Abdominal pain EXAM: PORTABLE CHEST 1 VIEW COMPARISON:  Portable exam 1649 hours compared to 07/05/2021 FINDINGS: Normal heart size, mediastinal contours, and pulmonary vascularity. Lungs clear. No pulmonary infiltrate, pleural effusion, pneumothorax, or acute osseous findings. IMPRESSION: No acute abnormalities. Electronically Signed   By: Lavonia Dana M.D.   On: 03/24/2022 17:12   DG ERCP  Result Date: 03/24/2022 CLINICAL DATA:  ERCP. 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 EXAM: ERCP TECHNIQUE: Multiple spot images obtained with the fluoroscopic device and submitted for interpretation post-procedure. FLUOROSCOPY TIME: FLUOROSCOPY TIME 8 minutes, 28 seconds (71.2 mGy) COMPARISON:  MRCP-03/22/2022 FINDINGS: 19 spot intraoperative fluoroscopic images of the right upper abdominal quadrant during ERCP are provided for review. Initial image demonstrates an ERCP probe overlying the right upper abdominal quadrant. Subsequent images demonstrate selective cannulation of the pancreatic duct, ultimately with  pancreatic stent placement. Subsequent images demonstrate selective cannulation and opacification of the common bile duct. There is abrupt occlusion of the central aspect of the common hepatic duct at the level of the biliary hilum with faint opacification of the intrahepatic biliary tree which appears moderately dilated. Completion images demonstrate placement of parallel positioned CBD stents, one  of which appears positioned within the central aspect of the right intrahepatic biliary tree while the other appears positioned within the central aspect of the left intrahepatic biliary tree. IMPRESSION: ERCP with pancreatic stent and parallel placed CBD stents traversing abrupt occlusion of the central aspect of the common hepatic duct. These images were submitted for radiologic interpretation only. Please see the procedural report for the amount of contrast and the fluoroscopy time utilized. Electronically Signed   By: Sandi Mariscal M.D.   On: 03/24/2022 16:21    Assessment / Plan: 1.  Stage IV sigmoid colon cancer 2.  Abdominal pain/jaundice and obstructing liver mass with progressive peritoneal carcinomatosis: Status post ERCP 03/24/2022 with LA grade C esophagitis, erythematous mucosa in the stomach, biliary sphincterotomy performed, severe biliary stricture was found in the [place] duct system which was malignant appearing, temporary plastic pancreatic stent was placed in the ventral pancreatic duct, LFTs continue to trend down. 3. Constipation/generalized abdominal distention and pain: No bowel movement in 1.5 weeks.  No results with several regimens here.  Someone brought her Trulance from home and she took a dose about an hour ago.  -Continue Pantoprazole 40 mg p.o. twice daily for 2 months, then once daily. -Continue to trend LFTs while inpatient. -Repeat ERCP in 3 to 4 months for exchange of stent/permanent stents.  That is being arranged by our office. -Antibiotics for total of 5 days due to hilar stricturing to decrease post interventional infectious risk. -KUB 2 view in 10 to 14 days to ensure pancreatic stent has migrated successfully, if still present at that time will need to be scheduled for EGD with stent pull.  This is being arranged by our office as well. -Brought a trulance from home.  Will see if she gets results. -Ordered gas-x chewables. -Should be up moving around, turning side  to side. -If no improvement tomorrow then we will repeat abdominal x-ray and consider smog enema.     LOS: 5 days   Laban Emperor. Ossie Beltran  03/26/2022, 3:49 PM

## 2022-03-26 NOTE — Progress Notes (Addendum)
Initial Nutrition Assessment  DOCUMENTATION CODES:   Severe malnutrition in context of acute illness/injury  INTERVENTION:  Encouraged intake of small, frequent meals to help improve tolerance.   Provide Ensure Enlive po TID, each supplement provides 350 kcal and 20 grams of protein.  Provide multivitamin with minerals po daily.  Monitor magnesium, potassium, and phosphorus daily for at least 3 days, MD to replete as needed, as pt is at risk for refeeding syndrome.  Pt requested education on ways to increase intake of calories and protein after discharge as difficult to obtain Ensure supplements due to cost. Provided "High Calorie, High Protein Nutrition Therapy" handout to patient. Discussed first advancing diet as tolerated per MD in setting of abdominal pain. Suspect this may also improve once pt able to have a bowel movement. Encouraged intake of small, frequent meals. Reviewed strategies to increase intake of calories and protein at meals and snacks once tolerated. Discussed alternatives to commercial oral nutrition supplements pt can try in home setting. Discussed Medical illustrator or homemade oral nutrition supplement with milk, 1/3 cup nonfat dry milk powder, and flavoring per pt preference.  Recommend obtaining weight measurement as it has not yet been measured this admission.  NUTRITION DIAGNOSIS:   Severe Malnutrition related to acute illness (nausea, emesis, abdominal pain, severe biliary stricture) as evidenced by energy intake < or equal to 50% for > or equal to 5 days, moderate muscle depletion.  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 Pt found to have centrally located mass in the liver causing obstruction of the confluence of intrahepatic ducts. Pt underwent ERCP with biliary sphincterotomy on 3/11 where malignant appearing stricture was dilated with plastic stent placement.  3/9: s/p MRCP with concerns for centrally located mass in the liver causing obstruction of the confluence of intrahepatic ducts. Findings suggestive of cholangiocarcinoma. 3/11: s/p ERCP with LA grace C esophagitis, erythematous mucosa in stomach, biliary sphincterotomy performed, severe biliary stricture found in [place] duct system which was malignant appearing, temporary plastic pancreatic stent placed in ventral pancreatic duct; diet advanced to full liquids 3/12: diet advanced to soft  Reviewed GI and attending MD notes. LFTs trending down. Plan is for repeat ERCP in 3-4 months for exchange of stents/permanent stents. Plan is for aggressive bowel regimen in setting of constipation.  Met with pt at bedside. She reports her appetite has been decreased since approximately 1 week PTA. She was experiencing nausea and emesis and also abdominal pain. She reports for one week PTA she could not tolerate intake of solids or liquids and would have emesis after intake. She reports nausea and emesis have now resolved. She continues to have some abdominal pain. She reports she has had constipation and has not had a BM since 03/12/22. That BM on 03/12/22 was not typical and was watery and dark green. She reports diet advancement to soft has gone well. She is eating <50% of her meals as she works to slowly increase intake. Pt reports at baseline she typically eats 2 meals per day + snacks. For  breakfast she may have oatmeal or egg sandwich or bacon sandwich with coffee. She skips lunch. For dinner she may have  protein with sides but reports her portions are smaller than her son's (unable to get exact description of portions). She may snack on fruit. She drinks mostly water but also has some tea at home. She enjoys oral nutrition supplements such as Ensure, but has difficulty obtaining them at home due to cost. She reports her MD has provided coupons, but even with coupons cost is prohibitive. Pt would like to drink Ensure here as oral nutrition supplement. Also provided information on tips for increasing intake of calories and protein (as tolerated) and non-commercial options for oral nutrition supplements after discharge.  Pt reports her UBW was 160 lbs (72.7 kg) and she has had wt loss. Per review of chart weights fluctuate between 70-75 kg. Pt was 72.6 kg on 03/03/22. She was 69.9 kg on 03/21/22. That is a weight loss of 2.7 kg or 3.7% weight over approximately 2 weeks. No weight has been obtained since admission so unable to trend. Recommend obtaining weight.  Medications reviewed and include: Dulcolax 10 mg BID PO, Dulcolax 10 mg daily RE (refused this AM per chart), Lopressor, pantoprazole, Miralax 17 grams BID PO, Norco PRN, Dilaudid PRN  Labs reviewed: Sodium 128, Potassium 3, Chloride 87. AST 96 (trending down), ALT 230 (trending down), Total Bilirubin 3 (trending down).  UOP: 8 occurrences unmeasured UOP previous 24 hrs  I/O: +1917.4 mL since admission (may not be accurate as UOP measured as occurrences rather than exact output)  NUTRITION - FOCUSED PHYSICAL EXAM:  Flowsheet Row Most Recent Value  Orbital Region No depletion  Upper Arm Region Mild depletion  Thoracic and Lumbar Region No depletion  Buccal Region No depletion  Temple Region No depletion  Clavicle Bone Region Moderate depletion  Clavicle and Acromion Bone Region Mild depletion  Scapular Bone Region No depletion  Dorsal Hand Moderate depletion  Patellar Region Moderate depletion  Anterior Thigh Region Moderate depletion   Posterior Calf Region Severe depletion  Edema (RD Assessment) None  Hair Reviewed  Eyes Reviewed  Mouth Reviewed  Skin Reviewed  Nails Reviewed       Diet Order:   Diet Order             DIET SOFT Room service appropriate? Yes; Fluid consistency: Thin  Diet effective now                  EDUCATION NEEDS:   Education needs have been addressed  Skin:  Skin Assessment: Reviewed RN Assessment  Last BM:  PTA (pt reports to RD last BM was on 03/12/22)  Height:   Ht Readings from Last 1 Encounters:  03/21/22 '5\' 4"'$  (1.626 m)   Weight:   Wt Readings from Last 1 Encounters:  03/21/22 69.9 kg   Ideal Body Weight:  54.5 kg  BMI:  Body mass index is 26.43 kg/m.  Estimated Nutritional Needs:   Kcal:  1800-2000  Protein:  90-100 grams  Fluid:  1.8-2 L/day  Loanne Drilling, MS, RD, LDN, CNSC Pager number available on Amion

## 2022-03-26 NOTE — Progress Notes (Signed)
   03/26/22 1100  Mobility  Activity Ambulated independently in hallway  Level of Assistance Independent  Assistive Device None  Distance Ambulated (ft) 800 ft  Activity Response Tolerated well  Mobility Referral Yes  $Mobility charge 1 Mobility   Mobility Specialist Progress Note  Pt was in bed and agreeable. X1 seated break d/t having " a cramp" in her shoulder. Returned to bed w/ all needs met and call bell in reach   [place] Specialist  Please contact via Solicitor or Rehab office at [phone]

## 2022-03-27 LAB — CYTOLOGY - NON PAP

## 2022-03-27 LAB — HEPATIC FUNCTION PANEL
ALT: 172 U/L — ABNORMAL HIGH (ref 0–44)
AST: 78 U/L — ABNORMAL HIGH (ref 15–41)
Albumin: 3.2 g/dL — ABNORMAL LOW (ref 3.5–5.0)
Alkaline Phosphatase: 506 U/L — ABNORMAL HIGH (ref 38–126)
Bilirubin, Direct: 1.1 mg/dL — ABNORMAL HIGH (ref 0.0–0.2)
Indirect Bilirubin: 1.2 mg/dL — ABNORMAL HIGH (ref 0.3–0.9)
Total Bilirubin: 2.3 mg/dL — ABNORMAL HIGH (ref 0.3–1.2)
Total Protein: 6.8 g/dL (ref 6.5–8.1)

## 2022-03-27 LAB — POTASSIUM: Potassium: 3.8 mmol/L (ref 3.5–5.1)

## 2022-03-27 LAB — MAGNESIUM: Magnesium: 2 mg/dL (ref 1.7–2.4)

## 2022-03-27 LAB — PHOSPHORUS: Phosphorus: 3.6 mg/dL (ref 2.5–4.6)

## 2022-03-27 MED ORDER — HYDRALAZINE HCL 50 MG PO TABS: 50.0000 mg | ORAL_TABLET | Freq: Three times a day (TID) | ORAL | 2 refills | Status: DC

## 2022-03-27 MED ORDER — ADULT MULTIVITAMIN W/MINERALS CH: 1.0000 | ORAL_TABLET | Freq: Every day | ORAL | Status: DC

## 2022-03-27 MED ORDER — PANTOPRAZOLE SODIUM 40 MG PO TBEC: DELAYED_RELEASE_TABLET | ORAL | 0 refills | Status: DC

## 2022-03-27 MED ORDER — AMOXICILLIN-POT CLAVULANATE 875-125 MG PO TABS: 1.0000 | ORAL_TABLET | Freq: Two times a day (BID) | ORAL | 0 refills | Status: AC

## 2022-03-27 MED ORDER — ENSURE ENLIVE PO LIQD: 237.0000 mL | Freq: Three times a day (TID) | ORAL | 0 refills | Status: DC

## 2022-03-27 NOTE — Discharge Summary (Signed)
Physician Discharge Summary   Patient: April [name] MRN: [id number] DOB: [birth year]  Admit date:     03/21/2022  Discharge date: 03/27/22  Discharge Physician: Cordelia Poche, MD   PCP: Orma Render, NP   Recommendations at discharge:  PCP follow-up GI follow-up in one week for repeat abdominal x-ray Continue antibiotics (complete a 5 day course) Protonix BID x2 months followed by daily dosing  Discharge Diagnoses: Principal Problem:   Hyperbilirubinemia Active Problems:   Hypertension   Hypokalemia   Colorectal cancer, stage IV (HCC)   Abnormal LFTs   Nephrolithiasis   Biliary obstruction   Metastatic colon cancer to liver ([place])   Obstructive jaundice   Biliary stricture   Protein-calorie malnutrition, severe   Chronic constipation  Resolved Problems:   * No resolved hospital problems. *  Hospital Course: April [name] is a 47 y.o. 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 Benay Spice), hypertension, recurrent bouts of nephrolithiasis with multiple urologic interventions at [facility] urology who presents as a direct admission from [facility] health cancer center for abnormal LFTs and hyperbilirubinemia. GI consulted. Patient found to have multiple malignant strictures on ERCP and underwent dilation and pancreatic duct stenting. GI recommendation for outpatient follow-up.  Assessment and Plan:  Metastatic biliary stricture Intrahepatic biliary obstruction Secondary to previously known metastatic colorectal cancer. [facility] consulted and performed ERCP on 3/11 with stricture dilation and plastic pancreatic stent placement. -GI recommendations: continue to observe inpatient with possible need for repeat intervention   Metastatic colorectal cancer Carcinomatosis Patient follows with Dr. Benay Spice as an outpatient. Therapy currently on hold.    Hypokalemia Possibly secondary to poor oral intake, complicated by hydrochlorothiazide use. Improved with supplementation and discontinuing hydrochlorothiazide. Discontinue hydrochlorothiazide on discharge.   Hyponatremia Possibly secondary to poor oral intake, complicated by hydrochlorothiazide use. Improved with IV fluids and discontinuing hydrochlorothiazide. Discontinue hydrochlorothiazide on discharge.    LA grade C esophagitis Noted on ERCP. Protonix 40 mg BID x2 months followed by daily   Primary hypertension Continue amlodipine and metoprolol. Increase to hydralazine 50 mg TID. Discontinue hydrochlorothiazide on discharge.  Constipation Improved with patients home Trulance   Consultants: [facility] Procedures performed:  3/11: ERCP   Disposition: Home Diet recommendation: Regular diet   DISCHARGE MEDICATION: Allergies as of 03/27/2022       Reactions   Lisinopril Swelling, Other (See Comments)   Lip swelling and Angioedema   Irinotecan Other (See Comments)   Stomach cramping Patient given 0.5 mL atropine IV and was able to continue infusion.  See Progress note on 10/03/20.    Leucovorin Calcium Other (See Comments)   Stomach cramping Patient given 0.5 mL atropine IV and was able to continue infusion. See Progress note on 10/03/20.         Medication List     STOP taking these medications    hydrochlorothiazide 25 MG tablet Commonly known as: HYDRODIURIL       TAKE these medications    amLODipine 10 MG tablet Commonly known as: NORVASC Take 1 tablet (10 mg total) by mouth daily.   amoxicillin-clavulanate 875-125 MG tablet Commonly known as: AUGMENTIN Take 1 tablet by mouth 2 (two) times daily for 4 doses.   feeding supplement Liqd Take 237 mLs by mouth 3 (three) times daily between meals.   hydrALAZINE 50 MG tablet Commonly known as: APRESOLINE Take 1 tablet (50 mg total) by mouth 3 (three)  times daily. What changed:  medication  strength how much to take   HYDROcodone-acetaminophen 10-325 MG tablet Commonly known as: Norco Take 1 tablet by mouth every 6 (six) hours as needed. What changed: reasons to take this   Lonsurf 20-8.19 MG tablet Generic drug: trifluridine-tipiracil Take 3 tablets (60 mg trifluridine) in AM and 2 tablets (40 mg trifluridine) in PM. Take within 1 hr after AM & PM meals on days 1-5, 8-12. Repeat every 28 days. Start cycle on 03/03/22 What changed:  how much to take how to take this when to take this additional instructions   metoprolol tartrate 25 MG tablet Commonly known as: LOPRESSOR Take 1 tablet (25 mg total) by mouth 2 (two) times daily.   multivitamin with minerals Tabs tablet Take 1 tablet by mouth daily. Start taking on: March 28, 2022   pantoprazole 40 MG tablet Commonly known as: PROTONIX Take 1 tablet (40 mg total) by mouth 2 (two) times daily before a meal for 60 days, THEN 1 tablet (40 mg total) daily. Start taking on: March 27, 2022   potassium chloride 20 MEQ/15ML (10%) Soln Take 15 mLs (20 mEq total) by mouth 2 (two) times daily. Take 15 ml twice a day for 4 days, then daily What changed:  when to take this additional instructions   prochlorperazine 10 MG tablet Commonly known as: COMPAZINE Take 1 tablet (10 mg total) by mouth every 6 (six) hours as needed for nausea or vomiting.   promethazine 25 MG tablet Commonly known as: PHENERGAN Take 25 mg by mouth every 6 (six) hours as needed for nausea or vomiting.   Trulance 3 MG Tabs Generic drug: Plecanatide Take 3 mg by mouth daily as needed (for constipation).   TYLENOL 500 MG tablet Generic drug: acetaminophen Take 1,000 mg by mouth 2 (two) times daily.        Follow-up Information     Early, Coralee Pesa, NP. Schedule an appointment as soon as possible for a visit in 1 week(s).   Specialty: Nurse Practitioner Why: For hospital follow-up Contact information: [address]  [place] [address] [facility] Follow up in 1 week(s).   Specialty: Gastroenterology Why: For hospital follow-up. Abdominal x-ray. Contact information: [address] [place] [phone]               Discharge Exam: Danley Danker Weights   03/27/22 0500  Weight: 62.2 kg   General exam: Appears calm and comfortable Respiratory system: Clear to auscultation. Respiratory effort normal. Cardiovascular system: S1 & S2 heard, RRR. Gastrointestinal system: Abdomen is nondistended, soft and tender in epigastric area. Normal bowel sounds heard. Central nervous system: Alert and oriented. No focal neurological deficits. Musculoskeletal: No edema. No calf tenderness Skin: No cyanosis. No rashes Psychiatry: Judgement and insight appear normal. Mood & affect appropriate.   Condition at discharge: stable  The results of significant diagnostics from this hospitalization (including imaging, microbiology, ancillary and laboratory) are listed below for reference.   Imaging Studies: DG Abd 2 Views  Result Date: 03/24/2022 CLINICAL DATA:  Abdominal pain EXAM: ABDOMEN - 2 VIEW COMPARISON:  Abdominal series 03/21/2022. CT abdomen and pelvis 01/30/2022. FINDINGS: Two new biliary stents are in place. There is no free intraperitoneal air. Bilateral renal calculi appear unchanged. Bowel-gas pattern is nonobstructive. There is moderate gaseous distention of the stomach. Lung bases are clear. No fractures are seen. IMPRESSION: 1. Two new  biliary stents are in place. 2. Bilateral renal calculi appear unchanged. 3. Moderate gaseous distention of the stomach. No bowel obstruction. Electronically Signed   By: Ronney Asters M.D.   On: 03/24/2022 17:17   DG CHEST PORT 1 VIEW  Result Date: 03/24/2022 CLINICAL DATA:  Abdominal pain EXAM: PORTABLE CHEST 1 VIEW COMPARISON:  Portable exam 1649 hours compared to 07/05/2021  FINDINGS: Normal heart size, mediastinal contours, and pulmonary vascularity. Lungs clear. No pulmonary infiltrate, pleural effusion, pneumothorax, or acute osseous findings. IMPRESSION: No acute abnormalities. Electronically Signed   By: Lavonia Dana M.D.   On: 03/24/2022 17:12   DG ERCP  Result Date: 03/24/2022 CLINICAL DATA:  ERCP. 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 EXAM: ERCP TECHNIQUE: Multiple spot images obtained with the fluoroscopic device and submitted for interpretation post-procedure. FLUOROSCOPY TIME: FLUOROSCOPY TIME 8 minutes, 28 seconds (71.2 mGy) COMPARISON:  MRCP-03/22/2022 FINDINGS: 19 spot intraoperative fluoroscopic images of the right upper abdominal quadrant during ERCP are provided for review. Initial image demonstrates an ERCP probe overlying the right upper abdominal quadrant. Subsequent images demonstrate selective cannulation of the pancreatic duct, ultimately with pancreatic stent placement. Subsequent images demonstrate selective cannulation and opacification of the common bile duct. There is abrupt occlusion of the central aspect of the common hepatic duct at the level of the biliary hilum with faint opacification of the intrahepatic biliary tree which appears moderately dilated. Completion images demonstrate placement of parallel positioned CBD stents, one of which appears positioned within the central aspect of the right intrahepatic biliary tree while the other appears positioned within the central aspect of the left intrahepatic biliary tree. IMPRESSION: ERCP with pancreatic stent and parallel placed CBD stents traversing abrupt occlusion of the central aspect of the common hepatic duct. These images were submitted for radiologic interpretation only. Please see the procedural report for the amount of contrast and the fluoroscopy time utilized. Electronically Signed   By: Sandi Mariscal M.D.   On: 03/24/2022 16:21   MR ABDOMEN MRCP W WO  CONTAST  Result Date: 03/23/2022 CLINICAL DATA:  47 year old female with history of metastatic colon cancer. New elevation of liver function tests. Jaundice. EXAM: MRI ABDOMEN WITHOUT AND WITH CONTRAST (INCLUDING MRCP) TECHNIQUE: Multiplanar multisequence MR imaging of the abdomen was performed both before and after the administration of intravenous contrast. Heavily T2-weighted images of the biliary and pancreatic ducts were obtained, and three-dimensional MRCP images were rendered by post processing. CONTRAST:  97m GADAVIST GADOBUTROL 1 MMOL/ML IV SOLN COMPARISON:  No prior abdominal MRI. CT the abdomen and pelvis 01/30/2022. FINDINGS: Lower chest: Unremarkable. Hepatobiliary: MRCP images demonstrate severe intrahepatic biliary ductal dilatation. Distal common hepatic bile duct is normal in caliber measuring 5 mm. Common bile duct measures 4 mm in the porta hepatis. Coronal MRCP image 95 of series 8 demonstrates a gap of approximately 1.1 cm between the most proximal patent portion of the common hepatic duct and the remaining patent and dilated intrahepatic biliary tree. On postcontrast images this area is remarkable for a mass-like region of heterogeneous hypoenhancement on early arterial phase imaging which becomes a more mass-like confluent area of enhancement on post gadolinium delayed images (axial image 56 of series 1104), estimated to measure approximately 2.5 x 1.6 cm. This area demonstrates diffusion restriction. No filling defects in the more distal aspect of the common bile duct are noted to suggest choledocholithiasis. Gallbladder is only moderately distended but contains material that is intermediate to high signal intensity on T1 weighted images and  slightly low signal intensity on T2 weighted images, likely reflecting biliary sludge. No pericholecystic fluid or surrounding inflammatory changes. Pancreas: No pancreatic mass. No pancreatic ductal dilatation. No pancreatic or peripancreatic fluid  collections or inflammatory changes. Spleen:  Unremarkable. Adrenals/Urinary Tract: 1.6 cm T1 hypointense, T2 hyperintense, nonenhancing lesion in the medial aspect of the interpolar region of the left kidney, compatible with a small simple cyst (no imaging follow-up recommended). Signal voids are present within the collecting systems of both kidneys (upper pole of the left kidney and interpolar region of the right kidney), corresponding to large calculi noted on prior CT examination. No aggressive appearing renal lesions are noted. No hydroureteronephrosis in the visualized portions of the abdomen. Bilateral adrenal glands are normal in appearance. Stomach/Bowel: Visualized portions are unremarkable. Vascular/Lymphatic: No aneurysm identified in the visualized abdominal vasculature. No definite lymphadenopathy noted in the abdomen. Other: Numerous enhancing nodules are noted in the peritoneal cavity, indicative of widespread peritoneal carcinomatosis, largest of which is located inferior to the right lobe of the liver (axial image 82 of series 1102) measuring 2.6 x 1.5 cm (previously 2.4 x 0.9 cm on CT examination 01/30/2022 on axial image 23 of series 2 of that study). Trace volume of perihepatic ascites. Musculoskeletal: No aggressive appearing osseous lesions are noted in the visualized portions of the skeleton. IMPRESSION: 1. Centrally located mass in the liver causing obstruction of the confluence of the intrahepatic bile ducts and the proximal common hepatic duct, resulting in severe intrahepatic biliary ductal dilatation. This mass demonstrates progressively increasing enhancement on delayed images. These imaging findings are most compatible with an aggressive neoplasm such as a cholangiocarcinoma, and are not favored to represent a metastatic lesion. 2. Progressive peritoneal carcinomatosis, as above. 3. Additional incidental findings, as above. These results were discussed by telephone at the time of  interpretation on 03/23/2022 at 7:45 AM with provider Dr. Havery Moros, who verbally acknowledged these results. Electronically Signed   By: Vinnie Langton M.D.   On: 03/23/2022 07:55   DG Abd 2 Views  Result Date: 03/21/2022 CLINICAL DATA:  History of colorectal cancer.  Constipation. EXAM: ABDOMEN - 2 VIEW COMPARISON:  None Available. FINDINGS: Lung bases are clear. Mild S shaped curvature of the thoracolumbar spine. Nonobstructive bowel gas pattern. No pneumatosis. No portal venous gas. No supine evidence of pneumoperitoneum. There are degenerative changes in the lower lumbar spine. Assessment of the sacrum is limited due to overlying bowel gas. Right pelvic phleboliths. Large colonic stool burden. Redemonstrated bilateral nephrolithiasis. There is an additional calcific density projecting along the left psoas muscle which could represent a ureteral calculus. IMPRESSION: 1. Nonobstructive bowel gas pattern. Large colonic stool burden. 2. Redemonstrated bilateral nephrolithiasis. There is an additional calcific density projecting along the left psoas muscle which could represent a ureteral calculus. Recommend further evaluation with bilateral renal ultrasound there is clinical concern for hydronephrosis. Electronically Signed   By: Marin Roberts M.D.   On: 03/21/2022 13:59    Microbiology: Results for orders placed or performed in visit on 01/20/22  Urine Culture     Status: Abnormal   Collection Time: 01/20/22  9:20 AM   Specimen: Urine, Random  Result Value Ref Range Status   Specimen Description   Final    URINE, RANDOM Performed at [facility], [address] [place], [place] [address]    Special Requests   Final    NONE Performed at [facility], [address] [place], [place] [address]    Culture MULTIPLE  SPECIES PRESENT, SUGGEST RECOLLECTION (A)  Final   Report Status 01/21/2022 FINAL  Final    Labs: CBC: Recent Labs  Lab 03/21/22 1310  03/22/22 0350 03/25/22 0118 03/26/22 0038  WBC 6.0 6.5 10.4 10.2  NEUTROABS 3.2  --   --   --   HGB 14.3 13.7 13.4 12.8  HCT 41.2 38.7 38.2 37.0  MCV 91.8 91.3 90.7 91.4  PLT 226 206 252 [id number]   Basic Metabolic Panel: Recent Labs  Lab 03/23/22 0428 03/24/22 0251 03/25/22 0118 03/26/22 0038 03/26/22 1428 03/27/22 0014 03/27/22 1212  NA 135 133* 133* 128* 132*  --   --   K 3.5 3.1* 4.5 3.0* 3.4*  --  3.8  CL 98 93* 94* 87* 92*  --   --   CO2 '25 29 28 29 29  '$ --   --   GLUCOSE 86 99 112* 139* 117*  --   --   BUN '6 10 13 15 16  '$ --   --   CREATININE 0.59 0.73 0.85 0.64 0.72  --   --   CALCIUM 9.7 9.6 9.7 9.2 9.9  --   --   MG 1.9  --  2.0  --   --  2.0  --   PHOS  --   --   --   --   --  3.6  --    Liver Function Tests: Recent Labs  Lab 03/22/22 0350 03/23/22 0428 03/24/22 0251 03/25/22 0118 03/26/22 0038  AST 267* 282* 403* 230* 96*  ALT 364* 367* 388* 327* 230*  ALKPHOS 555* 755* 809* 880* 708*  BILITOT 7.2* 9.1* 11.0* 4.7* 3.0*  PROT 6.5 7.2 7.1 7.2 7.1  ALBUMIN 3.2* 3.3* 3.3* 3.2* 3.1*    Discharge time spent: 35 minutes.  Signed: Cordelia Poche, MD [facility] 03/27/2022

## 2022-03-27 NOTE — Progress Notes (Signed)
Pt was given night medication to take home from pyxis, due to not having transportation to pharmacy.

## 2022-03-27 NOTE — Progress Notes (Signed)
TOC CSW was consulted for transportation for pt.  Pt needed a ride home.  [address] [place], [place] [address]  [place] contacted [employer].  Franciso Dierks Tarpley-Carter, MSW, LCSW-A Pronouns:  She/Her/Hers [facility] HealthTransitions of Care Clinical Social Worker Direct Number:  [phone] [email or web address]

## 2022-03-27 NOTE — Discharge Instructions (Signed)
April [name],  You were in the hospital with abdominal pain from a blockage of ducts within your abdomen relating to your biliary system (liver, gallbladder, pancreas). The gastroenterologist placed a stent to help with your symptoms. Please take your new medication as prescribed and follow-up with your PCP and the gastroenterologist's office.

## 2022-03-27 NOTE — Progress Notes (Signed)
[facility] Progress Note  CC:  Abdominal pain, abnormal liver tests and metastatic colon cancer     Subjective:  Feels a little better.  Has some pain in her upper abdomen.  Had two BMs after Trulance yesterday.  Feels comfortable going home and feels like she can manage there.  Has been eating and up and moving around without issues.  Objective:  Vital signs in last 24 hours: Temp:  [97.8 F (36.6 C)-98.4 F (36.9 C)] 98.3 F (36.8 C) (03/14 0816) Pulse Rate:  [94-110] 108 (03/14 0816) Resp:  [16-19] 18 (03/14 0816) BP: (111-137)/(91-109) 131/109 (03/14 0816) SpO2:  [98 %-100 %] 99 % (03/14 0816) Weight:  [62.2 kg] 62.2 kg (03/14 0500) Last BM Date : 03/27/22 General:  Alert, Well-developed, in NAD Heart:  Slightly tachy but regular rhythm; no murmurs Pulm:  CTAB.  No W/R/R. Abdomen:  Soft, maybe slightly distended.  BS present.  Some upper abdominal TTP. Extremities:  Without edema. Neurologic:  Alert and oriented x 4;  grossly normal neurologically. Psych:  Alert and cooperative. Normal mood and affect.  Intake/Output from previous day: 03/13 0701 - 03/14 0700 In: 240 [P.O.:240] Out: -  Intake/Output this shift: Total I/O In: 480 [P.O.:480] Out: -   Lab Results: Recent Labs    03/25/22 0118 03/26/22 0038  WBC 10.4 10.2  HGB 13.4 12.8  HCT 38.2 37.0  PLT 252 268   BMET Recent Labs    03/25/22 0118 03/26/22 0038 03/26/22 1428 03/27/22 1212  NA 133* 128* 132*  --   K 4.5 3.0* 3.4* 3.8  CL 94* 87* 92*  --   CO2 '28 29 29  '$ --   GLUCOSE 112* 139* 117*  --   BUN '13 15 16  '$ --   CREATININE 0.85 0.64 0.72  --   CALCIUM 9.7 9.2 9.9  --    LFT Recent Labs    03/26/22 0038  PROT 7.1  ALBUMIN 3.1*  AST 96*  ALT 230*  ALKPHOS 708*  BILITOT 3.0*   Assessment / Plan: 1.  Stage IV sigmoid colon cancer 2.  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 3. Constipation/generalized abdominal distention and pain: No bowel movement in 1.5 weeks.  No results with several regimens here.  Someone brought her Trulance from home and she took a dose about an hour ago.  She took that on 3/13 and had two BMs.   -Continue Pantoprazole 40 mg p.o. twice daily for 2 months, then once daily. -Continue to trend LFTs while inpatient.  GI or oncology will follow them as outpatient. -Repeat ERCP in 3 to 4 months for exchange of stent/permanent stents.  That is being arranged by our office. -Antibiotics for total of 5 days due to hilar stricturing to decrease post interventional infectious risk. -KUB 2 view in 10 to 14 days to ensure pancreatic stent has migrated successfully, if still present at that time will need to be scheduled for EGD with stent pull.  This is being arranged by our office as well.  I have advised the patient to come to [facility] building between 3/21-3/24 for this. -Continue Trulance at home.   LOS: 6  days   Laban Emperor. Elyas Villamor  03/27/2022, 3:26 PM

## 2022-03-28 ENCOUNTER — Encounter (HOSPITAL_COMMUNITY): Payer: Self-pay | Admitting: Gastroenterology

## 2022-03-28 ENCOUNTER — Telehealth: Payer: Self-pay

## 2022-03-28 ENCOUNTER — Other Ambulatory Visit: Payer: Self-pay

## 2022-03-28 ENCOUNTER — Telehealth: Payer: Self-pay | Admitting: *Deleted

## 2022-03-28 ENCOUNTER — Other Ambulatory Visit: Payer: Self-pay | Admitting: Nurse Practitioner

## 2022-03-28 DIAGNOSIS — C786 Secondary malignant neoplasm of retroperitoneum and peritoneum: Secondary | ICD-10-CM

## 2022-03-28 DIAGNOSIS — C20 Malignant neoplasm of rectum: Secondary | ICD-10-CM

## 2022-03-28 MED ORDER — HYDROCODONE-ACETAMINOPHEN 10-325 MG PO TABS
1.0000 | ORAL_TABLET | Freq: Four times a day (QID) | ORAL | 0 refills | Status: DC | PRN
  Filled 2022-03-28: qty 30, 8d supply, fill #0

## 2022-03-28 NOTE — Telephone Encounter (Signed)
Yasira called to report she was d/c home from hospital without pain medication. Informed her that NP will refill it now for her.

## 2022-03-28 NOTE — Patient Outreach (Signed)
  Care Coordination   Follow Up Visit Note   03/28/2022 Name: April [name] MRN: [id number] DOB: [birth year]  Incoming call from patient. She voices that she went to pharmacy to pick up her new meds and was told no meds were there for her. RN CM contacted pharmacy meds sent to per d/c summary. Spoke with Benita and confirmed that meds were there and ready for pickup. Advised patient of this and she will obtain meds. She will call RN CM back if she has any further issues. She voices that oncology office is faxing script for her pain meds and she will be able to get them today.       Care Coordination Interventions:  Yes, provided   Interventions Today    Flowsheet Row Most Recent Value  General Interventions   General Interventions Discussed/Reviewed Communication with  Communication with --  [RN CM contacted [facility] pharmacy-[phone]]        Follow up plan: No further intervention required.   Encounter Outcome:  Pt. Visit Completed   [facility] Care Management Care Management Community Coordinator Direct Phone: [phone] Toll Free: [phone] Fax: [phone]

## 2022-03-28 NOTE — Transitions of Care (Post Inpatient/ED Visit) (Signed)
   03/28/2022  Name: April [name] MRN: [id number] DOB: 03-25-1975  Today's TOC FU Call Status: Today's TOC FU Call Status:: Successful TOC FU Call Competed TOC FU Call Complete Date: 03/28/22  Transition Care Management Follow-up Telephone Call Date of Discharge: 03/27/22 Discharge [facility]) Type of Discharge: Inpatient Admission Primary Inpatient Discharge Diagnosis:: "hyperbilirubinemia" How have you been since you were released from the hospital?:  (She states she woke up in pain. She was not sent home with a script for her pain meds and was out prior to admission. She has already contacted provider office to request refill on pain meds ASAP. In the meantime she has been taking OTC Tylenol.) Any questions or concerns?: Yes Patient Questions/Concerns:: Patient out of pain meds Patient Questions/Concerns Addressed: Other: (she has already called MD office this morning to address the issue)  Items Reviewed: Medications obtained and verified?: No (Patient did not feel up to med review. States she will go pick up new meds later today.) Any new allergies since your discharge?: No Dietary orders reviewed?: Yes Type of Diet Ordered:: regular  Home Care and Equipment/Supplies: [place] Ordered?: NA Any new equipment or medical supplies ordered?: NA  Functional Questionnaire: Do you need assistance with bathing/showering or dressing?: No Do you need assistance with meal preparation?: No Do you need assistance with eating?: No Do you have difficulty maintaining continence: No Do you need assistance with getting out of bed/getting out of a chair/moving?: No Do you have difficulty managing or taking your medications?: No  Folllow up appointments reviewed: PCP Follow-up appointment confirmed?: No (Patient did not feel up to making appt while on call-will call at later time on her own to arange appt) [facility] Follow-up appointment confirmed?:  Yes Date of Specialist follow-up appointment?: 03/31/22 Follow-Up Specialty Provider:: Dr. Benay Spice Do you need transportation to your follow-up appointment?:  (did not get to assess-pt wanted to end call due to not feeling well)   TOC Interventions Today    Flowsheet Row Most Recent Value  TOC Interventions   TOC Interventions Discussed/Reviewed TOC Interventions Discussed      Interventions Today    Flowsheet Row Most Recent Value  Education Interventions   Education Provided Provided Education  [pain mgmt]  Provided Verbal Education On Nutrition, Medication, When to see the doctor  Pharmacy Interventions   Pharmacy Dicussed/Reviewed Pharmacy Topics Discussed, Medications and their functions      [facility] Care Management Care Management Community Coordinator Direct Phone: [phone] Toll Free: [phone] Fax: [phone]

## 2022-03-29 ENCOUNTER — Other Ambulatory Visit (HOSPITAL_COMMUNITY): Payer: Self-pay

## 2022-03-31 ENCOUNTER — Inpatient Hospital Stay: Payer: 59

## 2022-03-31 ENCOUNTER — Other Ambulatory Visit: Payer: Self-pay | Admitting: *Deleted

## 2022-03-31 ENCOUNTER — Ambulatory Visit (HOSPITAL_BASED_OUTPATIENT_CLINIC_OR_DEPARTMENT_OTHER)
Admission: RE | Admit: 2022-03-31 | Discharge: 2022-03-31 | Disposition: A | Discharge status: Home / Self Care | Payer: 59 | Source: Ambulatory Visit | Attending: Oncology | Admitting: Oncology

## 2022-03-31 ENCOUNTER — Other Ambulatory Visit (HOSPITAL_COMMUNITY): Payer: Self-pay

## 2022-03-31 ENCOUNTER — Telehealth: Payer: Self-pay | Admitting: *Deleted

## 2022-03-31 ENCOUNTER — Inpatient Hospital Stay (HOSPITAL_BASED_OUTPATIENT_CLINIC_OR_DEPARTMENT_OTHER): Payer: 59 | Admitting: Oncology

## 2022-03-31 VITALS — BP 138/82 | HR 100 | Temp 98.1°F | Resp 18 | Ht 64.0 in | Wt 153.6 lb

## 2022-03-31 DIAGNOSIS — K5900 Constipation, unspecified: Secondary | ICD-10-CM | POA: Diagnosis not present

## 2022-03-31 DIAGNOSIS — Z9079 Acquired absence of other genital organ(s): Secondary | ICD-10-CM | POA: Diagnosis not present

## 2022-03-31 DIAGNOSIS — C786 Secondary malignant neoplasm of retroperitoneum and peritoneum: Secondary | ICD-10-CM | POA: Diagnosis not present

## 2022-03-31 DIAGNOSIS — Z90722 Acquired absence of ovaries, bilateral: Secondary | ICD-10-CM | POA: Diagnosis not present

## 2022-03-31 DIAGNOSIS — I10 Essential (primary) hypertension: Secondary | ICD-10-CM | POA: Diagnosis not present

## 2022-03-31 DIAGNOSIS — C19 Malignant neoplasm of rectosigmoid junction: Secondary | ICD-10-CM

## 2022-03-31 DIAGNOSIS — N200 Calculus of kidney: Secondary | ICD-10-CM | POA: Diagnosis not present

## 2022-03-31 DIAGNOSIS — C20 Malignant neoplasm of rectum: Secondary | ICD-10-CM

## 2022-03-31 DIAGNOSIS — R1010 Upper abdominal pain, unspecified: Secondary | ICD-10-CM | POA: Diagnosis not present

## 2022-03-31 DIAGNOSIS — C187 Malignant neoplasm of sigmoid colon: Secondary | ICD-10-CM | POA: Diagnosis not present

## 2022-03-31 DIAGNOSIS — Z87442 Personal history of urinary calculi: Secondary | ICD-10-CM | POA: Diagnosis not present

## 2022-03-31 DIAGNOSIS — Z79899 Other long term (current) drug therapy: Secondary | ICD-10-CM | POA: Diagnosis not present

## 2022-03-31 LAB — CBC WITH DIFFERENTIAL (CANCER CENTER ONLY)
Abs Immature Granulocytes: 0.04 10*3/uL (ref 0.00–0.07)
Basophils Absolute: 0.1 10*3/uL (ref 0.0–0.1)
Basophils Relative: 1 %
Eosinophils Absolute: 0.6 10*3/uL — ABNORMAL HIGH (ref 0.0–0.5)
Eosinophils Relative: 7 %
HCT: 34.3 % — ABNORMAL LOW (ref 36.0–46.0)
Hemoglobin: 11.6 g/dL — ABNORMAL LOW (ref 12.0–15.0)
Immature Granulocytes: 0 %
Lymphocytes Relative: 35 %
Lymphs Abs: 3.1 10*3/uL (ref 0.7–4.0)
MCH: 32 pg (ref 26.0–34.0)
MCHC: 33.8 g/dL (ref 30.0–36.0)
MCV: 94.5 fL (ref 80.0–100.0)
Monocytes Absolute: 0.7 10*3/uL (ref 0.1–1.0)
Monocytes Relative: 8 %
Neutro Abs: 4.5 10*3/uL (ref 1.7–7.7)
Neutrophils Relative %: 49 %
Platelet Count: 469 10*3/uL — ABNORMAL HIGH (ref 150–400)
RBC: 3.63 MIL/uL — ABNORMAL LOW (ref 3.87–5.11)
RDW: 14 % (ref 11.5–15.5)
WBC Count: 9.1 10*3/uL (ref 4.0–10.5)
nRBC: 0 % (ref 0.0–0.2)

## 2022-03-31 LAB — CMP (CANCER CENTER ONLY)
ALT: 68 U/L — ABNORMAL HIGH (ref 0–44)
AST: 32 U/L (ref 15–41)
Albumin: 3.8 g/dL (ref 3.5–5.0)
Alkaline Phosphatase: 359 U/L — ABNORMAL HIGH (ref 38–126)
Anion gap: 10 (ref 5–15)
BUN: 14 mg/dL (ref 6–20)
CO2: 25 mmol/L (ref 22–32)
Calcium: 9.8 mg/dL (ref 8.9–10.3)
Chloride: 100 mmol/L (ref 98–111)
Creatinine: 0.56 mg/dL (ref 0.44–1.00)
GFR, Estimated: >60 mL/min (ref >60)
Glucose, Bld: 140 mg/dL — ABNORMAL HIGH (ref 70–99)
Potassium: 3.7 mmol/L (ref 3.5–5.1)
Sodium: 135 mmol/L (ref 135–145)
Total Bilirubin: 1.5 mg/dL — ABNORMAL HIGH (ref 0.3–1.2)
Total Protein: 7.5 g/dL (ref 6.5–8.1)

## 2022-03-31 LAB — TOTAL PROTEIN, URINE DIPSTICK: Protein, ur: NEGATIVE mg/dL

## 2022-03-31 LAB — LIPASE, BLOOD: Lipase: 52 U/L — ABNORMAL HIGH (ref 11–51)

## 2022-03-31 MED ORDER — OXYCODONE HCL 5 MG PO TABS
5.0000 mg | ORAL_TABLET | Freq: Once | ORAL | Status: AC
  Administered 2022-03-31: 5 mg via ORAL
  Filled 2022-03-31: qty 1

## 2022-03-31 MED ORDER — ACETAMINOPHEN 325 MG PO TABS
325.0000 mg | ORAL_TABLET | Freq: Once | ORAL | Status: AC
  Administered 2022-03-31: 325 mg via ORAL
  Filled 2022-03-31: qty 1

## 2022-03-31 NOTE — Progress Notes (Signed)
[facility] OFFICE PROGRESS NOTE   Diagnosis: Colon cancer  INTERVAL HISTORY:   April [name] was discharged in the hospital 03/27/2022.  Jaundice has improved.  She has developed increased mid upper abdominal pain.  She ran out of her pain medication.  Objective:  Vital signs in last 24 hours:  Blood pressure 138/82, pulse 100, temperature 98.1 F (36.7 C), temperature source Oral, resp. rate 18, height 5\' 4"  (1.626 m), weight 153 lb 9.6 oz (69.7 kg), last menstrual period 01/07/2006, SpO2 100 %.    HEENT: Sclera anicteric Resp: Lungs clear bilaterally Cardio: Regular rate and rhythm GI: Soft, tender in the mid upper abdomen, no hepatomegaly, no mass Vascular: No leg edema   Lab Results:  Lab Results  Component Value Date   WBC 9.1 03/31/2022   HGB 11.6 (L) 03/31/2022   HCT 34.3 (L) 03/31/2022   MCV 94.5 03/31/2022   PLT 469 (H) 03/31/2022   NEUTROABS 4.5 03/31/2022    CMP  Lab Results  Component Value Date   NA 132 (L) 03/26/2022   K 3.8 03/27/2022   CL 92 (L) 03/26/2022   CO2 29 03/26/2022   GLUCOSE 117 (H) 03/26/2022   BUN 16 03/26/2022   CREATININE 0.72 03/26/2022   CALCIUM 9.9 03/26/2022   PROT 6.8 03/27/2022   ALBUMIN 3.2 (L) 03/27/2022   AST 78 (H) 03/27/2022   ALT 172 (H) 03/27/2022   ALKPHOS 506 (H) 03/27/2022   BILITOT 2.3 (H) 03/27/2022   GFRNONAA >60 03/26/2022   GFRAA >60 10/17/2019    Lab Results  Component Value Date   CEA1 <0.6 03/23/2022   CEA 19.65 (H) 03/21/2022   CAN199 213 (H) 03/23/2022    Lab Results  Component Value Date   INR 1.0 10/22/2020   LABPROT 13.6 10/22/2020    Imaging:  No results found.  Medications: I have reviewed the patient's current medications.   Assessment/Plan: Sigmoid colon cancer, stage IV (pT4a,pN2b,M1c) Colonoscopy 03/24/2019-3 rectal polyps-hyperplastic polyps, distal colon biopsy-at least intramucosal adenocarcinoma, completely obstructing mass in the distal sigmoid colon,  could not be traversed, intact mismatch repair protein expression 03/24/2019-CEA 56.1 03/29/2019 CT abdomen/pelvis-circumferential thickening involving the entire mid and distal sigmoid colon to the level of the rectosigmoid junction, solid/cystic mass in the left ovary, bilateral nephrolithiasis Robotic assisted low anterior resection, mesenteric lymphadenectomy, bilateral salpingo-oophorectomy 04/08/2019 Pathology ([facility] review of outside pathology) metastatic adenocarcinoma involving the peritoneum overlying the round ligament, serosa of the urinary bladder, serosa of the right ureter a sacral area, pelvic peritoneum, and left ovary.  Omental biopsy with focal mucin pools with no carcinoma cells identified, right hemidiaphragm biopsy involved by metastatic adenocarcinoma, invasive adenocarcinoma the sigmoid colon, moderately differentiated, T4a, perineural and vascular invasion present, 7/22 lymph nodes, multiple tumor deposits, resection margins negative Negative for PD-L1, low probability of MSI-high, HER-2 negative, negative for BRAF, NRAS and KRAS alterations CTs 05/19/2019-no evidence of metastatic disease, findings suspicious for colitis of the transverse and ascending colon, small amount of ascites in the cul-de-sac, bilateral renal calculi Cycle 1 FOLFIRI 05/24/2019, bevacizumab added with cycle 2 Cycle 4 FOLFIRI/bevacizumab 07/05/2019 Cycle 5 FOLFIRI 07/27/2019, bevacizumab held secondary to hypertension Cycle 6 FOLFIRI 08/10/1999, bevacizumab held secondary to hypertension CTs 08/30/2019-no evidence of recurrent disease, new subsolid right lower lobe nodule felt to be inflammatory, emphysema Cycle 7 FOLFIRI 09/26/2019, bevacizumab remains on hold secondary to hypertension Cycle 8 FOLFIRI 10/17/2019, bevacizumab held secondary to hypertension, Udenyca added for neutropenia Cycle 9 FOLFIRI 11/07/2019, bevacizumab held secondary to  hypertension, Udenyca Cycle 10 FOLFIRI 11/28/2019, bevacizumab held,  Udenyca Cycle 11 FOLFIRI 12/26/2019, bevacizumab held, Udenyca CTs 01/25/2020-no evidence of recurrent disease, resolution of right lower lobe nodule Maintenance Xeloda beginning 02/06/2020 CTs 04/25/2020- no evidence of metastatic disease, multiple bilateral renal calculi without hydronephrosis Maintenance Xeloda continued CT abdomen/pelvis without contrast 08/10/2020-bilateral staghorn renal calculi, no evidence of metastatic disease; addendum 08/23/2020-small but increasing omental nodules. Cycle 1 FOLFIRI/panitumumab 09/19/2020 Cycle 2 FOLFIRI/Panitumumab 10/03/2020 Cycle 3 FOLFIRI/Panitumumab 10/17/2020 Cycle 4 FOLFIRI/panitumumab 10/31/2020 Cycle 5 FOLFIRI/Panitumumab 11/14/2020 CTs 11/26/2020-stable omental metastases compared to 10/22/2020, mildly decreased from 08/10/2020.  Left lower quadrant tiny paracolic gutter implant slightly decreased from CT 08/10/2020.  No new or progressive metastatic disease in the abdomen or pelvis. Cycle 6 FOLFIRI/Panitumumab 11/28/2020, irinotecan dose reduced, treatment schedule adjusted to every 3 weeks going forward Cycle 7 FOLFIRI/panitumumab 12/24/2020 Cycle 8 FOLFIRI/Panitumumab 01/16/2021--treatment held due to hypertension in the infusion area. Cycle 8 FOLFIRI/panitumumab 01/21/2021 Cycle 9 FOLFIRI/panitumumab 02/11/2021 Cycle 10 FOLFIRI/Panitumumab 03/04/2021 Cycle 11 FOLFIRI/Panitumumab 03/25/2021 CT abdomen/pelvis 04/10/2021-stable right omental and left posterior paracolic gutter nodules Cycle 12 FOLFIRI/panitumumab 04/15/2021 Cycle 13 FOLFIRI/panitumumab 05/06/2021 Cycle 14 FOLFIRI/Panitumumab 05/27/2021 Cycle 15 FOLFIRI/Panitumumab 06/17/2021 CT abdomen/pelvis 07/05/2021-slight enlargement of a dominant right omental implant, no new implants Patient requested a treatment break CT abdomen/pelvis 10/04/2021-mild increase in size of multiple omental soft tissue nodules, 4 mm calculus in the distal left ureter adjacent to the ureteral stent Cycle 1 Lonsurf  10/14/2021 10/28/2021 Avastin every 2 weeks Cycle 2 Lonsurf 11/11/2021 Cycle 3 Lonsurf 12/09/2021 Cycle 4 Lonsurf 01/06/2022 Avastin held 01/20/2022 due to proteinuria, 24-hour urine 43 mg CT/pelvis 01/30/2022-possible new peritoneal implant at the transverse colon, no significant change in other omental/peritoneal implants, stable chronic rectal wall thickening, status post removal of double-J left ureteral stent with no evidence of hydronephrosis or ureteral calculus, nonobstructing bilateral renal calculi Cycle 5 Lonsurf 02/05/2022 or 02/06/2022 Avastin every 2 weeks Cycle 6 Lonsurf 03/03/2022   Hypertension G4 P3, twins Kidney stones-bilateral staghorn renal calculi on CT 08/10/2020 Infected Port-A-Cath 09/12/2019-placed on Augmentin, referred for Port-A-Cath removal; Port-A-Cath removed 09/14/2019; PICC line placed 09/14/2019; culture staph aureus.  Course of Septra completed. Neutropenia secondary to chemotherapy-Udenyca added with cycle 8 FOLFIRI Right nephrostomy tube 09/12/2020; stent placed 10/09/2020, percutaneous nephrostolithotomy treatment of right-sided kidney stones 10/09/2020, malpositioned right ureter stent replacement 10/23/2020, right ureter stent removed 10/29/2020 Percutaneous left nephrostolithotomy 10/01/2021-no renal stone identified, left ureter stent left in place Cystoscopy 10/14/2021-stent removed 8.  Admission 03/21/2022 with new onset jaundice, abdominal pain, nausea MRI abdomen 03/22/2022-central liver mass with obstruction at the confluence of the intrahepatic ducts and proximal common hepatic duct with severe Intermatic biliary ductal dilatation, progressive peritoneal carcinomatosis ERCP 03/24/2022-severe biliary stricture in the hepatic duct affecting the left and right hepatic duct, common hepatic duct, and bifurcation, malignant appearing, temporary plastic pancreatic stent, left and right hepatic duct stents were placed     Disposition: [name] has metastatic colon  cancer.  She was admitted last week with obstructive jaundice, likely secondary to a colon cancer metastasis obstructing the intrahepatic bile ducts.  The bilirubin is lower following placement of the hepatic stents.  She presents today with increased upper abdominal pain.  She may have post ERCP pancreatitis.  We will check a lipase level.  I will contact gastroenterology to discuss evaluation and management of possible post ERCP pancreatitis.  Ms. Allegra has clinical progression of metastatic colon cancer.  She will be referred for restaging CTs and return for an office visit next week.  Lonsurf/Avastin will be placed on hold.  She was last treated with FOLFIRI/panitumumab in June 2023.  We will consider resuming treatment with FOLFIRI/panitumumab versus FOLFOX based therapy.  Betsy Coder, MD  03/31/2022  10:30 AM

## 2022-03-31 NOTE — Telephone Encounter (Signed)
Error

## 2022-03-31 NOTE — Patient Instructions (Signed)
Provided printed note to stay on clear liquid diet for now. Go to ER if uncontrolled pain or fever to be admitted for pancreatitis. She understands and agrees.

## 2022-03-31 NOTE — Progress Notes (Signed)
Upper mid abdominal pain rated 9/10. Oxycodone/apap given at 1051. @ 1128: Reports pain is 4-5/10. Feels better. Lipase 52 (MD aware). Will reach out to GI to discuss while she is in the office. @ 12:20-Dr. Benay Spice spoke w/Dr. Carlean Purl, who said she is OK to go home. Needs to be on clear liquids and go for abdominal xray today. Go to ER for fever or uncontrolled pain. She tells RN that her pain is now rated 3/10.

## 2022-04-01 ENCOUNTER — Telehealth: Payer: Self-pay

## 2022-04-01 ENCOUNTER — Other Ambulatory Visit: Payer: Self-pay

## 2022-04-01 NOTE — Telephone Encounter (Signed)
Spoke with pt regarding her pain. She reports that she was able to get the x-ray done yesterday and that her pain is doing better. [facility] pharmacy had mailed her her pain rx and she got it last night. She reports that it is helping with pain control.

## 2022-04-01 NOTE — Progress Notes (Signed)
Patient outreached by Georga Kaufmann, PharmD Candidate on 04/01/22 to discuss hypertension    Patient has an automated home blood pressure machine. Patient reports she has not been monitoring BP at home due to concern of inaccurate readings. She will take her BP monitor to her next appointment to assess accuracy. Most recent BP 138/82 mmHg at visit on 03/31/22.    Medication review was performed. They are taking medications as prescribed.   The following barriers to adherence were noted:  - They do not have cost concerns.  - They do not have transportation concerns.  - They do not need assistance obtaining refills.  - They do not occasionally forget to take some of their prescribed medications.  - They do not feel like one/some of their medications make them feel poorly.  - They do not have questions or concerns about their medications.  - They do not have follow up scheduled with their primary care provider/cardiologist.   The following interventions were completed:  - Medications were reviewed  - Patient was educated on goal blood pressures and long term health implications of elevated blood pressure  - Patient was educated on proper technique to check home blood pressure and reminded to bring home machine and readings to next provider appointment  - Patient was educated on medications, including indication and administration  - Patient was educated on how to access home blood pressure machine  - Patient was counseled on lifestyle modifications to improve blood pressure, including DASH diet and physical activity   The patient will call to schedule a follow-up with her PCP  PCP: Jacolyn Reedy, NP    Georga Kaufmann, PharmD Candidate   Maryan Puls, PharmD PGY-1 [facility] Pharmacy Resident

## 2022-04-02 ENCOUNTER — Telehealth: Payer: Self-pay

## 2022-04-02 ENCOUNTER — Other Ambulatory Visit: Payer: Self-pay | Admitting: Hematology and Oncology

## 2022-04-02 NOTE — Patient Outreach (Signed)
  Care Coordination   Follow Up Visit Note   04/02/2022 Name: April [name] MRN: [id number] DOB: Jun 10, 1975  Outreach call to patient to follow up on previous TOC call. Patient states she continues to have abd pain-taking pin meds. She used to only have to take pain meds about one a day. However, now she is having to take them about every 6 hrs around the clock to stay on top of pain. She did discuss this with oncologist. He is going to follow up with patient's GI MD per pt report for possible further testing/workup. She goes back for oncology appt on 04/07/22. Patient plans to discuss pain mgmt and other options with provider. She states MD encouraged her to try clear liquid diet as eating solid foods aggravates pain. She has been trying to do that the past few days. Reviewed and discussed food options for clear liquid diet. Discussed follow up ad being assigned to RN CM for ongoing support and patient agreeable.        Care Coordination Interventions:  Yes, provided    Interventions Today    Flowsheet Row Most Recent Value  General Interventions   General Interventions Discussed/Reviewed General Interventions Discussed, Referral to Nurse  Education Interventions   Education Provided Provided Education  [pain mgmt]  Provided Verbal Education On Nutrition, Medication, When to see the doctor  Nutrition Interventions   Nutrition Discussed/Reviewed Nutrition Discussed  [clear liquid diet]  Pharmacy Interventions   Pharmacy Dicussed/Reviewed Pharmacy Topics Discussed, Medications and their functions      TOC Interventions Today    Flowsheet Row Most Recent Value  TOC Interventions   TOC Interventions Discussed/Reviewed TOC Interventions Reviewed       Follow up plan: Referral made to RN CM -appt scheduled    Encounter Outcome:  Pt. Visit Completed   [facility] Care Management Care Management Community Coordinator Direct Phone:  [phone] Toll Free: [phone] Fax: [phone]

## 2022-04-03 ENCOUNTER — Telehealth: Payer: Self-pay | Admitting: Dietician

## 2022-04-03 ENCOUNTER — Telehealth: Payer: Self-pay | Admitting: *Deleted

## 2022-04-03 NOTE — Telephone Encounter (Signed)
Reach out to patient to follow up on PO intake since discharge.  She reports still having pain (tried to use pain scale but she didn't know how to answer).  Still having pain worse when she eats.  She can't remember having any bowel movements since being discharged from the hospital.  She has only been using Miralax once a day, and she has not taken anything yet today.  She reports PO intake yesterday: grits ate (most of that) and some collard greens Water 32-40 oz.  She has some Gatorade in house and broth.  She isn't sure she feels well enough to get out and shop for herself.  She had an x-ray Monday and is scheduled for a CT scan tomorrow.  Reviewed clear liquids.  Encouraged increasing high calorie clear liquids to 60-80 oz instead of plain water.  Encouraged Miralax BID and to call into cancer center if no BM today.  Will have RD follow up after CT scan.  April Manson, RDN, LDN Registered Dietitian, [place] Part Time Remote (Usual office hours: Tuesday-Thursday) Mobile: [phone] Remote Office: [phone]

## 2022-04-03 NOTE — Telephone Encounter (Signed)
Called patient after dietician called to report patient still having pain and bowel issues. She has not had BM since hospital d/c. Taking Miralax daily and did eat some grits and colllards. Taking her Hydrocodone every 6 hours. She is passing gas and denies any nausea. Abdomen does feel tight.  Per Dr. Benay Spice: Increase MiraLax to bid, stay on clear liquids only and continue pain med. Keep CT appointment tomorrow and he will see her afterwards. Instructed her also to go to ER if she starts having fever or N/V. Dietician will send a case of Ensure clear to office tomorrow to give her.

## 2022-04-04 ENCOUNTER — Other Ambulatory Visit (HOSPITAL_BASED_OUTPATIENT_CLINIC_OR_DEPARTMENT_OTHER): Payer: Self-pay

## 2022-04-04 ENCOUNTER — Ambulatory Visit (HOSPITAL_BASED_OUTPATIENT_CLINIC_OR_DEPARTMENT_OTHER)
Admission: RE | Admit: 2022-04-04 | Discharge: 2022-04-04 | Disposition: A | Discharge status: Home / Self Care | Payer: 59 | Source: Ambulatory Visit | Attending: Oncology | Admitting: Oncology

## 2022-04-04 ENCOUNTER — Inpatient Hospital Stay (HOSPITAL_BASED_OUTPATIENT_CLINIC_OR_DEPARTMENT_OTHER): Payer: 59 | Admitting: Oncology

## 2022-04-04 ENCOUNTER — Other Ambulatory Visit: Payer: Self-pay | Admitting: *Deleted

## 2022-04-04 ENCOUNTER — Inpatient Hospital Stay: Payer: 59

## 2022-04-04 VITALS — BP 148/80 | HR 81 | Temp 98.1°F | Resp 18 | Ht 64.0 in | Wt 159.8 lb

## 2022-04-04 DIAGNOSIS — Z79899 Other long term (current) drug therapy: Secondary | ICD-10-CM | POA: Diagnosis not present

## 2022-04-04 DIAGNOSIS — C189 Malignant neoplasm of colon, unspecified: Secondary | ICD-10-CM | POA: Diagnosis not present

## 2022-04-04 DIAGNOSIS — C19 Malignant neoplasm of rectosigmoid junction: Secondary | ICD-10-CM

## 2022-04-04 DIAGNOSIS — R1010 Upper abdominal pain, unspecified: Secondary | ICD-10-CM | POA: Diagnosis not present

## 2022-04-04 DIAGNOSIS — K5900 Constipation, unspecified: Secondary | ICD-10-CM | POA: Diagnosis not present

## 2022-04-04 DIAGNOSIS — I10 Essential (primary) hypertension: Secondary | ICD-10-CM | POA: Diagnosis not present

## 2022-04-04 DIAGNOSIS — Z87442 Personal history of urinary calculi: Secondary | ICD-10-CM | POA: Diagnosis not present

## 2022-04-04 DIAGNOSIS — C786 Secondary malignant neoplasm of retroperitoneum and peritoneum: Secondary | ICD-10-CM | POA: Diagnosis not present

## 2022-04-04 DIAGNOSIS — C187 Malignant neoplasm of sigmoid colon: Secondary | ICD-10-CM | POA: Diagnosis not present

## 2022-04-04 MED ORDER — OXYCODONE-ACETAMINOPHEN 5-325 MG PO TABS
1.0000 | ORAL_TABLET | ORAL | 0 refills | Status: DC | PRN
  Filled 2022-04-04: qty 30, 5d supply, fill #0

## 2022-04-04 MED ORDER — ACETAMINOPHEN 325 MG PO TABS
325.0000 mg | ORAL_TABLET | Freq: Once | ORAL | Status: AC
  Administered 2022-04-04: 325 mg via ORAL
  Filled 2022-04-04: qty 1

## 2022-04-04 MED ORDER — LACTULOSE 10 GM/15ML PO SOLN
10.0000 g | Freq: Two times a day (BID) | ORAL | 1 refills | Status: DC
  Filled 2022-04-04: qty 236, 8d supply, fill #0

## 2022-04-04 MED ORDER — IOHEXOL 300 MG/ML  SOLN
100.0000 mL | Freq: Once | INTRAMUSCULAR | Status: AC | PRN
  Administered 2022-04-04: 80 mL via INTRAVENOUS

## 2022-04-04 MED ORDER — OXYCODONE HCL 5 MG PO TABS
5.0000 mg | ORAL_TABLET | Freq: Once | ORAL | Status: AC
  Administered 2022-04-04: 5 mg via ORAL
  Filled 2022-04-04: qty 1

## 2022-04-04 NOTE — Progress Notes (Signed)
April [name] OFFICE PROGRESS NOTE   Diagnosis: Colon cancer  INTERVAL HISTORY:   April [name] returns as scheduled.  She continues to have upper abdominal and mid back pain.  The pain is not adequately relieved with hydrocodone.  She last had a bowel movement on 03/31/2022.  Objective:  Vital signs in last 24 hours:  Blood pressure (!) 148/80, pulse 81, temperature 98.1 F (36.7 C), temperature source Oral, resp. rate 18, height 5\' 4"  (1.626 m), weight 159 lb 12.8 oz (72.5 kg), last menstrual period 01/07/2006, SpO2 100 %.    Resp: Lungs clear bilaterally Cardio: Regular rate and rhythm GI: Soft, mild tenderness in the mid and right upper abdomen, no mass, no hepatomegaly Vascular: No leg edema   Lab Results:  Lab Results  Component Value Date   WBC 9.1 03/31/2022   HGB 11.6 (L) 03/31/2022   HCT 34.3 (L) 03/31/2022   MCV 94.5 03/31/2022   PLT 469 (H) 03/31/2022   NEUTROABS 4.5 03/31/2022    CMP  Lab Results  Component Value Date   NA 135 03/31/2022   K 3.7 03/31/2022   CL 100 03/31/2022   CO2 25 03/31/2022   GLUCOSE 140 (H) 03/31/2022   BUN 14 03/31/2022   CREATININE 0.56 03/31/2022   CALCIUM 9.8 03/31/2022   PROT 7.5 03/31/2022   ALBUMIN 3.8 03/31/2022   AST 32 03/31/2022   ALT 68 (H) 03/31/2022   ALKPHOS 359 (H) 03/31/2022   BILITOT 1.5 (H) 03/31/2022   GFRNONAA >60 03/31/2022   GFRAA >60 10/17/2019    Lab Results  Component Value Date   CEA1 <0.6 03/23/2022   CEA 19.65 (H) 03/21/2022   CAN199 213 (H) 03/23/2022    Imaging:  CT CHEST ABDOMEN PELVIS W CONTRAST  Result Date: 04/04/2022 CLINICAL DATA:  Restaging metastatic colon cancer. * Tracking Code: BO * EXAM: CT CHEST, ABDOMEN, AND PELVIS WITH CONTRAST TECHNIQUE: Multidetector CT imaging of the chest, abdomen and pelvis was performed following the standard protocol during bolus administration of intravenous contrast. RADIATION DOSE REDUCTION: This exam was performed according to  the departmental dose-optimization program which includes automated exposure control, adjustment of the mA and/or kV according to patient size and/or use of iterative reconstruction technique. CONTRAST:  64mL OMNIPAQUE IOHEXOL 300 MG/ML  SOLN COMPARISON:  MRI abdomen 03/22/2022. CT 01/30/2022 and older. Chest CT 08/30/2019. FINDINGS: CT CHEST FINDINGS Cardiovascular: Heart is nonenlarged. No pericardial effusion. Minimal atherosclerotic changes along the thoracic aorta. Diameter of the ascending aorta at the level of the right pulmonary artery measures 3.7 cm. Mediastinum/Nodes: No specific abnormal lymph node enlargement identified in the axillary region, hilum or mediastinum. Only exception is some prominent anterior right cardiophrenic lymph nodes on series 2, image 47. Normal caliber thoracic esophagus. There is some triangular-shaped soft tissue in the anterior superior mediastinum, likely some residual thymus tissue. This was seen on the prior examination from 2021. Bovine type aortic arch, normal variant. Lungs/Pleura: There is some linear opacity at the bases likely scar or atelectasis. No pleural effusion. Bilateral apical subpleural blebs identified, right-greater-than-left. No discrete lung nodule identified. Musculoskeletal: No chest wall mass or suspicious bone lesions identified. CT ABDOMEN PELVIS FINDINGS Hepatobiliary: There has been placement of 2 adjacent endoscopic biliary stents. The level of intrahepatic biliary duct dilatation has significantly improved. There is some air. Once again there is some ill-defined tissue identified in the hilum of the liver with narrowing of the anterior branch of the right portal vein. Tissue is difficult to  measure and compare to the prior but appears subjectively relatively similar. There is some underlying fatty liver infiltration. No separate true space-occupying liver lesion. Gallbladder is nondilated. Pancreas: Minimal ectasia of the pancreatic duct towards  the head. No pancreatic mass. No abnormal enhancement. Spleen: Normal in size without focal abnormality. Adrenals/Urinary Tract: Adrenal glands are preserved. Upper pole left-sided renal stone and lower pole right-sided stone. Both kidneys show some mild atrophy. No enhancing renal mass or collecting system dilatation. Preserved contours of the urinary bladder. Stomach/Bowel: Moderate diffuse colonic stool. The stomach and small bowel are nondilated. There is a pigtail stent noted along the proximal third portion of the duodenal. Please correlate for any known history. Vascular/Lymphatic: Normal caliber aorta and IVC with some scattered vascular calcifications. No separate abnormal lymph node enlargement identified in the abdomen and pelvis. Reproductive: Uterus is present. Small uterine fibroid. No separate adnexal mass. Other: Multiple peritoneal implants are again identified. These include anterior right upper quadrant, left lateral hemipelvis. There is some trace ascites in the pelvis. Example nodule measured on the prior in January 2024 which measured 14 x 11 mm, today on series 2 image 71 measures 18 x 11 mm. Focus just superomedial to this which previously measured 2.5 x 1.1 cm, today measures 2.3 by 1.4 cm on series 2, image 67. There is overall increase in size and number of lesions. Musculoskeletal: Curvature of the lumbar spine. Scattered degenerative changes. 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 IMPRESSION: Progression of peritoneal implants.  Slight pelvic ascites. Central mass lesion liver again identified with narrowing of the anterior branch of the right portal vein. There has been placement of indwelling paired biliary stents with improving of the biliary ductal dilatation. There are some peripheral areas of residual biliary ductal dilatation. No separate intrahepatic liver lesion at this time. Stable prominent anterior right cardiophrenic lymph nodes. There is separate  pigtail stent or catheter noted along the lumen of the proximal duodenal. Please correlate for clinical history. Bilateral nonobstructing renal stones. Moderate colonic stool. Electronically Signed   By: Jill Side M.D.   On: 04/04/2022 14:13    Medications: I have reviewed the patient's current medications.   Assessment/Plan: Sigmoid colon cancer, stage IV (pT4a,pN2b,M1c) Colonoscopy 03/24/2019-3 rectal polyps-hyperplastic polyps, distal colon biopsy-at least intramucosal adenocarcinoma, completely obstructing mass in the distal sigmoid colon, could not be traversed, intact mismatch repair protein expression 03/24/2019-CEA 56.1 03/29/2019 CT abdomen/pelvis-circumferential thickening involving the entire mid and distal sigmoid colon to the level of the rectosigmoid junction, solid/cystic mass in the left ovary, bilateral nephrolithiasis Robotic assisted low anterior resection, mesenteric lymphadenectomy, bilateral salpingo-oophorectomy 04/08/2019 Pathology ([facility] review of outside pathology) metastatic adenocarcinoma involving the peritoneum overlying the round ligament, serosa of the urinary bladder, serosa of the right ureter a sacral area, pelvic peritoneum, and left ovary.  Omental biopsy with focal mucin pools with no carcinoma cells identified, right hemidiaphragm biopsy involved by metastatic adenocarcinoma, invasive adenocarcinoma the sigmoid colon, moderately differentiated, T4a, perineural and vascular invasion present, 7/22 lymph nodes, multiple tumor deposits, resection margins negative Negative for PD-L1, low probability of MSI-high, HER-2 negative, negative for BRAF, NRAS and KRAS alterations CTs 05/19/2019-no evidence of metastatic disease, findings suspicious for colitis of the transverse and ascending colon, small amount of ascites in the cul-de-sac, bilateral renal calculi Cycle 1 FOLFIRI 05/24/2019, bevacizumab added with cycle 2 Cycle 4 FOLFIRI/bevacizumab 07/05/2019 Cycle 5 FOLFIRI  07/27/2019, bevacizumab held secondary to hypertension Cycle 6 FOLFIRI 08/10/1999, bevacizumab held secondary to  hypertension CTs 08/30/2019-no evidence of recurrent disease, new subsolid right lower lobe nodule felt to be inflammatory, emphysema Cycle 7 FOLFIRI 09/26/2019, bevacizumab remains on hold secondary to hypertension Cycle 8 FOLFIRI 10/17/2019, bevacizumab held secondary to hypertension, Udenyca added for neutropenia Cycle 9 FOLFIRI 11/07/2019, bevacizumab held secondary to hypertension, Udenyca Cycle 10 FOLFIRI 11/28/2019, bevacizumab held, Udenyca Cycle 11 FOLFIRI 12/26/2019, bevacizumab held, Udenyca CTs 01/25/2020-no evidence of recurrent disease, resolution of right lower lobe nodule Maintenance Xeloda beginning 02/06/2020 CTs 04/25/2020- no evidence of metastatic disease, multiple bilateral renal calculi without hydronephrosis Maintenance Xeloda continued CT abdomen/pelvis without contrast 08/10/2020-bilateral staghorn renal calculi, no evidence of metastatic disease; addendum 08/23/2020-small but increasing omental nodules. Cycle 1 FOLFIRI/panitumumab 09/19/2020 Cycle 2 FOLFIRI/Panitumumab 10/03/2020 Cycle 3 FOLFIRI/Panitumumab 10/17/2020 Cycle 4 FOLFIRI/panitumumab 10/31/2020 Cycle 5 FOLFIRI/Panitumumab 11/14/2020 CTs 11/26/2020-stable omental metastases compared to 10/22/2020, mildly decreased from 08/10/2020.  Left lower quadrant tiny paracolic gutter implant slightly decreased from CT 08/10/2020.  No new or progressive metastatic disease in the abdomen or pelvis. Cycle 6 FOLFIRI/Panitumumab 11/28/2020, irinotecan dose reduced, treatment schedule adjusted to every 3 weeks going forward Cycle 7 FOLFIRI/panitumumab 12/24/2020 Cycle 8 FOLFIRI/Panitumumab 01/16/2021--treatment held due to hypertension in the infusion area. Cycle 8 FOLFIRI/panitumumab 01/21/2021 Cycle 9 FOLFIRI/panitumumab 02/11/2021 Cycle 10 FOLFIRI/Panitumumab 03/04/2021 Cycle 11 FOLFIRI/Panitumumab 03/25/2021 CT abdomen/pelvis  04/10/2021-stable right omental and left posterior paracolic gutter nodules Cycle 12 FOLFIRI/panitumumab 04/15/2021 Cycle 13 FOLFIRI/panitumumab 05/06/2021 Cycle 14 FOLFIRI/Panitumumab 05/27/2021 Cycle 15 FOLFIRI/Panitumumab 06/17/2021 CT abdomen/pelvis 07/05/2021-slight enlargement of a dominant right omental implant, no new implants Patient requested a treatment break CT abdomen/pelvis 10/04/2021-mild increase in size of multiple omental soft tissue nodules, 4 mm calculus in the distal left ureter adjacent to the ureteral stent Cycle 1 Lonsurf 10/14/2021 10/28/2021 Avastin every 2 weeks Cycle 2 Lonsurf 11/11/2021 Cycle 3 Lonsurf 12/09/2021 Cycle 4 Lonsurf 01/06/2022 Avastin held 01/20/2022 due to proteinuria, 24-hour urine 43 mg CT/pelvis 01/30/2022-possible new peritoneal implant at the transverse colon, no significant change in other omental/peritoneal implants, stable chronic rectal wall thickening, status post removal of double-J left ureteral stent with no evidence of hydronephrosis or ureteral calculus, nonobstructing bilateral renal calculi Cycle 5 Lonsurf 02/05/2022 or 02/06/2022 Avastin every 2 weeks Cycle 6 Lonsurf 03/03/2022 CTs 04/05/2022-increase in size and number of peritoneal implants compared to January 2024 central liver mass narrowing the anterior branch of the right portal vein, decreased biliary duct dilation, biliary stents in place, stable right cardiophrenic lymph nodes, separate pigtail stent in the lumen of the proximal duodenum   Hypertension G4 P3, twins Kidney stones-bilateral staghorn renal calculi on CT 08/10/2020 Infected Port-A-Cath 09/12/2019-placed on Augmentin, referred for Port-A-Cath removal; Port-A-Cath removed 09/14/2019; PICC line placed 09/14/2019; culture staph aureus.  Course of Septra completed. Neutropenia secondary to chemotherapy-Udenyca added with cycle 8 FOLFIRI Right nephrostomy tube 09/12/2020; stent placed 10/09/2020, percutaneous nephrostolithotomy treatment of  right-sided kidney stones 10/09/2020, malpositioned right ureter stent replacement 10/23/2020, right ureter stent removed 10/29/2020 Percutaneous left nephrostolithotomy 10/01/2021-no renal stone identified, left ureter stent left in place Cystoscopy 10/14/2021-stent removed 8.  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  There is clinical and radiologic evidence of disease progression.  She was admitted 04/02/2022 with biliary obstruction.  The jaundice has resolved after placement of bile duct stents.  She has increased upper abdominal pain.  The pain may be related to post ERCP pancreatitis, progressive carcinomatosis, or the pancreas duct stent. Constipation may be contributing.  I will contact gastroenterology to discuss management of the duodenal stent.  She will begin lactulose for constipation.  She will continue oxycodone as needed for pain.  April [name] will return for an office visit as scheduled on 04/07/2022.  We prescribed oxycodone/APAP for pain.  Betsy Coder, MD  04/04/2022  3:47 PM

## 2022-04-04 NOTE — Progress Notes (Signed)
Reports her abdominal pain is rate 7/10. Gave oxycodone 5 mg/acetaminophen 325 dose. @ 1600 Reports pain is now down to 3/10. She is going to pharmacy downstairs to pick up her percocet script. Also provided script for Lactulose. Reports she just had a large pasty black stool. Will f/u on Monday as scheduled w/lab.

## 2022-04-07 ENCOUNTER — Other Ambulatory Visit (HOSPITAL_COMMUNITY): Payer: 59

## 2022-04-07 ENCOUNTER — Encounter: Payer: Self-pay | Admitting: Oncology

## 2022-04-07 ENCOUNTER — Other Ambulatory Visit (HOSPITAL_BASED_OUTPATIENT_CLINIC_OR_DEPARTMENT_OTHER): Payer: Self-pay

## 2022-04-07 ENCOUNTER — Inpatient Hospital Stay: Payer: 59

## 2022-04-07 ENCOUNTER — Ambulatory Visit (HOSPITAL_BASED_OUTPATIENT_CLINIC_OR_DEPARTMENT_OTHER)
Admission: RE | Admit: 2022-04-07 | Discharge: 2022-04-07 | Disposition: A | Discharge status: Home / Self Care | Payer: 59 | Source: Ambulatory Visit | Attending: Gastroenterology | Admitting: Gastroenterology

## 2022-04-07 ENCOUNTER — Inpatient Hospital Stay (HOSPITAL_BASED_OUTPATIENT_CLINIC_OR_DEPARTMENT_OTHER): Payer: 59 | Admitting: Oncology

## 2022-04-07 ENCOUNTER — Other Ambulatory Visit: Payer: Self-pay

## 2022-04-07 VITALS — BP 145/90 | HR 90 | Temp 98.1°F | Resp 18 | Ht 64.0 in | Wt 151.6 lb

## 2022-04-07 DIAGNOSIS — C786 Secondary malignant neoplasm of retroperitoneum and peritoneum: Secondary | ICD-10-CM | POA: Diagnosis not present

## 2022-04-07 DIAGNOSIS — Z87442 Personal history of urinary calculi: Secondary | ICD-10-CM | POA: Diagnosis not present

## 2022-04-07 DIAGNOSIS — I10 Essential (primary) hypertension: Secondary | ICD-10-CM

## 2022-04-07 DIAGNOSIS — C19 Malignant neoplasm of rectosigmoid junction: Secondary | ICD-10-CM | POA: Diagnosis not present

## 2022-04-07 DIAGNOSIS — N200 Calculus of kidney: Secondary | ICD-10-CM | POA: Diagnosis not present

## 2022-04-07 DIAGNOSIS — K831 Obstruction of bile duct: Secondary | ICD-10-CM

## 2022-04-07 DIAGNOSIS — Z79899 Other long term (current) drug therapy: Secondary | ICD-10-CM | POA: Diagnosis not present

## 2022-04-07 DIAGNOSIS — K5900 Constipation, unspecified: Secondary | ICD-10-CM | POA: Diagnosis not present

## 2022-04-07 DIAGNOSIS — C187 Malignant neoplasm of sigmoid colon: Secondary | ICD-10-CM | POA: Diagnosis not present

## 2022-04-07 DIAGNOSIS — R1010 Upper abdominal pain, unspecified: Secondary | ICD-10-CM | POA: Diagnosis not present

## 2022-04-07 LAB — CBC WITH DIFFERENTIAL (CANCER CENTER ONLY)
Abs Immature Granulocytes: 0.01 10*3/uL (ref 0.00–0.07)
Basophils Absolute: 0.1 10*3/uL (ref 0.0–0.1)
Basophils Relative: 1 %
Eosinophils Absolute: 0.6 10*3/uL — ABNORMAL HIGH (ref 0.0–0.5)
Eosinophils Relative: 8 %
HCT: 35 % — ABNORMAL LOW (ref 36.0–46.0)
Hemoglobin: 11.8 g/dL — ABNORMAL LOW (ref 12.0–15.0)
Immature Granulocytes: 0 %
Lymphocytes Relative: 50 %
Lymphs Abs: 3.7 10*3/uL (ref 0.7–4.0)
MCH: 31.6 pg (ref 26.0–34.0)
MCHC: 33.7 g/dL (ref 30.0–36.0)
MCV: 93.8 fL (ref 80.0–100.0)
Monocytes Absolute: 0.6 10*3/uL (ref 0.1–1.0)
Monocytes Relative: 8 %
Neutro Abs: 2.4 10*3/uL (ref 1.7–7.7)
Neutrophils Relative %: 33 %
Platelet Count: 440 10*3/uL — ABNORMAL HIGH (ref 150–400)
RBC: 3.73 MIL/uL — ABNORMAL LOW (ref 3.87–5.11)
RDW: 13.8 % (ref 11.5–15.5)
WBC Count: 7.3 10*3/uL (ref 4.0–10.5)
nRBC: 0 % (ref 0.0–0.2)

## 2022-04-07 MED ORDER — METOPROLOL TARTRATE 25 MG PO TABS
25.0000 mg | ORAL_TABLET | Freq: Two times a day (BID) | ORAL | 3 refills | Status: DC
  Filled 2022-04-07: qty 60, 30d supply, fill #0

## 2022-04-07 NOTE — Progress Notes (Signed)
[place] OFFICE PROGRESS NOTE   Diagnosis: Colon cancer  INTERVAL HISTORY:   April [name] returns as scheduled.  She reports improvement in abdominal pain since having a bowel movement on 04/04/2022.  She continues hydrocodone and oxycodone as needed for pain.  Objective:  Vital signs in last 24 hours:  Blood pressure (!) 145/90, pulse 90, temperature 98.1 F (36.7 C), temperature source Oral, resp. rate 18, height 5\' 4"  (1.626 m), weight 151 lb 9.6 oz (68.8 kg), last menstrual period 01/07/2006, SpO2 100 %.   Resp: Lungs clear bilaterally, no respiratory distress Cardio: The rate and rhythm GI: Soft, no mass, no hepatosplenomegaly, nontender Vascular: No leg edema   Lab Results:  Lab Results  Component Value Date   WBC 7.3 04/07/2022   HGB 11.8 (L) 04/07/2022   HCT 35.0 (L) 04/07/2022   MCV 93.8 04/07/2022   PLT 440 (H) 04/07/2022   NEUTROABS 2.4 04/07/2022    CMP  Lab Results  Component Value Date   NA 135 03/31/2022   K 3.7 03/31/2022   CL 100 03/31/2022   CO2 25 03/31/2022   GLUCOSE 140 (H) 03/31/2022   BUN 14 03/31/2022   CREATININE 0.56 03/31/2022   CALCIUM 9.8 03/31/2022   PROT 7.5 03/31/2022   ALBUMIN 3.8 03/31/2022   AST 32 03/31/2022   ALT 68 (H) 03/31/2022   ALKPHOS 359 (H) 03/31/2022   BILITOT 1.5 (H) 03/31/2022   GFRNONAA >60 03/31/2022   GFRAA >60 10/17/2019    Lab Results  Component Value Date   CEA1 <0.6 03/23/2022   CEA 19.65 (H) 03/21/2022   CAN199 213 (H) 03/23/2022    Lab Results  Component Value Date   INR 1.0 10/22/2020   LABPROT 13.6 10/22/2020    Imaging:  DG Abd 2 Views  Result Date: 04/07/2022 CLINICAL DATA:  Evaluate biliary stent EXAM: ABDOMEN - 2 VIEW COMPARISON:  March 31, 2022 FINDINGS: Two common bile duct stents are in stable position. The previously placed pancreatic stent continues to migrate inferiorly. Previous CT imaging demonstrated the pancreatic stent within duodenum. Renal stones are  again noted. No other significant interval changes. IMPRESSION: 1. The previously placed pancreatic stent continues to migrate inferiorly. Previous CT imaging demonstrated that it was within the duodenum. 2. The common bile duct stents are in stable position. 3. Renal stones again noted. Electronically Signed   By: Dorise Bullion III M.D.   On: 04/07/2022 09:45   CT CHEST ABDOMEN PELVIS W CONTRAST  Result Date: 04/04/2022 CLINICAL DATA:  Restaging metastatic colon cancer. * Tracking Code: BO * EXAM: CT CHEST, ABDOMEN, AND PELVIS WITH CONTRAST TECHNIQUE: Multidetector CT imaging of the chest, abdomen and pelvis was performed following the standard protocol during bolus administration of intravenous contrast. RADIATION DOSE REDUCTION: This exam was performed according to the departmental dose-optimization program which includes automated exposure control, adjustment of the mA and/or kV according to patient size and/or use of iterative reconstruction technique. CONTRAST:  23mL OMNIPAQUE IOHEXOL 300 MG/ML  SOLN COMPARISON:  MRI abdomen 03/22/2022. CT 01/30/2022 and older. Chest CT 08/30/2019. FINDINGS: CT CHEST FINDINGS Cardiovascular: Heart is nonenlarged. No pericardial effusion. Minimal atherosclerotic changes along the thoracic aorta. Diameter of the ascending aorta at the level of the right pulmonary artery measures 3.7 cm. Mediastinum/Nodes: No specific abnormal lymph node enlargement identified in the axillary region, hilum or mediastinum. Only exception is some prominent anterior right cardiophrenic lymph nodes on series 2, image 47. Normal caliber thoracic esophagus. There is some  triangular-shaped soft tissue in the anterior superior mediastinum, likely some residual thymus tissue. This was seen on the prior examination from 2021. Bovine type aortic arch, normal variant. Lungs/Pleura: There is some linear opacity at the bases likely scar or atelectasis. No pleural effusion. Bilateral apical subpleural  blebs identified, right-greater-than-left. No discrete lung nodule identified. Musculoskeletal: No chest wall mass or suspicious bone lesions identified. CT ABDOMEN PELVIS FINDINGS Hepatobiliary: There has been placement of 2 adjacent endoscopic biliary stents. The level of intrahepatic biliary duct dilatation has significantly improved. There is some air. Once again there is some ill-defined tissue identified in the hilum of the liver with narrowing of the anterior branch of the right portal vein. Tissue is difficult to measure and compare to the prior but appears subjectively relatively similar. There is some underlying fatty liver infiltration. No separate true space-occupying liver lesion. Gallbladder is nondilated. Pancreas: Minimal ectasia of the pancreatic duct towards the head. No pancreatic mass. No abnormal enhancement. Spleen: Normal in size without focal abnormality. Adrenals/Urinary Tract: Adrenal glands are preserved. Upper pole left-sided renal stone and lower pole right-sided stone. Both kidneys show some mild atrophy. No enhancing renal mass or collecting system dilatation. Preserved contours of the urinary bladder. Stomach/Bowel: Moderate diffuse colonic stool. The stomach and small bowel are nondilated. There is a pigtail stent noted along the proximal third portion of the duodenal. Please correlate for any known history. Vascular/Lymphatic: Normal caliber aorta and IVC with some scattered vascular calcifications. No separate abnormal lymph node enlargement identified in the abdomen and pelvis. Reproductive: Uterus is present. Small uterine fibroid. No separate adnexal mass. Other: Multiple peritoneal implants are again identified. These include anterior right upper quadrant, left lateral hemipelvis. There is some trace ascites in the pelvis. Example nodule measured on the prior in January 2024 which measured 14 x 11 mm, today on series 2 image 71 measures 18 x 11 mm. Focus just superomedial to  this which previously measured 2.5 x 1.1 cm, today measures 2.3 by 1.4 cm on series 2, image 67. There is overall increase in size and number of lesions. Musculoskeletal: Curvature of the lumbar spine. Scattered degenerative changes. 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 IMPRESSION: Progression of peritoneal implants.  Slight pelvic ascites. Central mass lesion liver again identified with narrowing of the anterior branch of the right portal vein. There has been placement of indwelling paired biliary stents with improving of the biliary ductal dilatation. There are some peripheral areas of residual biliary ductal dilatation. No separate intrahepatic liver lesion at this time. Stable prominent anterior right cardiophrenic lymph nodes. There is separate pigtail stent or catheter noted along the lumen of the proximal duodenal. Please correlate for clinical history. Bilateral nonobstructing renal stones. Moderate colonic stool. Electronically Signed   By: Jill Side M.D.   On: 04/04/2022 14:13    Medications: I have reviewed the patient's current medications.   Assessment/Plan: Sigmoid colon cancer, stage IV (pT4a,pN2b,M1c) Colonoscopy 03/24/2019-3 rectal polyps-hyperplastic polyps, distal colon biopsy-at least intramucosal adenocarcinoma, completely obstructing mass in the distal sigmoid colon, could not be traversed, intact mismatch repair protein expression 03/24/2019-CEA 56.1 03/29/2019 CT abdomen/pelvis-circumferential thickening involving the entire mid and distal sigmoid colon to the level of the rectosigmoid junction, solid/cystic mass in the left ovary, bilateral nephrolithiasis Robotic assisted low anterior resection, mesenteric lymphadenectomy, bilateral salpingo-oophorectomy 04/08/2019 Pathology ([facility] review of outside pathology) metastatic adenocarcinoma involving the peritoneum overlying the round ligament, serosa of the urinary bladder, serosa of the right  ureter a sacral  area, pelvic peritoneum, and left ovary.  Omental biopsy with focal mucin pools with no carcinoma cells identified, right hemidiaphragm biopsy involved by metastatic adenocarcinoma, invasive adenocarcinoma the sigmoid colon, moderately differentiated, T4a, perineural and vascular invasion present, 7/22 lymph nodes, multiple tumor deposits, resection margins negative Negative for PD-L1, low probability of MSI-high, HER-2 negative, negative for BRAF, NRAS and KRAS alterations CTs 05/19/2019-no evidence of metastatic disease, findings suspicious for colitis of the transverse and ascending colon, small amount of ascites in the cul-de-sac, bilateral renal calculi Cycle 1 FOLFIRI 05/24/2019, bevacizumab added with cycle 2 Cycle 4 FOLFIRI/bevacizumab 07/05/2019 Cycle 5 FOLFIRI 07/27/2019, bevacizumab held secondary to hypertension Cycle 6 FOLFIRI 08/10/1999, bevacizumab held secondary to hypertension CTs 08/30/2019-no evidence of recurrent disease, new subsolid right lower lobe nodule felt to be inflammatory, emphysema Cycle 7 FOLFIRI 09/26/2019, bevacizumab remains on hold secondary to hypertension Cycle 8 FOLFIRI 10/17/2019, bevacizumab held secondary to hypertension, Udenyca added for neutropenia Cycle 9 FOLFIRI 11/07/2019, bevacizumab held secondary to hypertension, Udenyca Cycle 10 FOLFIRI 11/28/2019, bevacizumab held, Udenyca Cycle 11 FOLFIRI 12/26/2019, bevacizumab held, Udenyca CTs 01/25/2020-no evidence of recurrent disease, resolution of right lower lobe nodule Maintenance Xeloda beginning 02/06/2020 CTs 04/25/2020- no evidence of metastatic disease, multiple bilateral renal calculi without hydronephrosis Maintenance Xeloda continued CT abdomen/pelvis without contrast 08/10/2020-bilateral staghorn renal calculi, no evidence of metastatic disease; addendum 08/23/2020-small but increasing omental nodules. Cycle 1 FOLFIRI/panitumumab 09/19/2020 Cycle 2 FOLFIRI/Panitumumab 10/03/2020 Cycle 3 FOLFIRI/Panitumumab  10/17/2020 Cycle 4 FOLFIRI/panitumumab 10/31/2020 Cycle 5 FOLFIRI/Panitumumab 11/14/2020 CTs 11/26/2020-stable omental metastases compared to 10/22/2020, mildly decreased from 08/10/2020.  Left lower quadrant tiny paracolic gutter implant slightly decreased from CT 08/10/2020.  No new or progressive metastatic disease in the abdomen or pelvis. Cycle 6 FOLFIRI/Panitumumab 11/28/2020, irinotecan dose reduced, treatment schedule adjusted to every 3 weeks going forward Cycle 7 FOLFIRI/panitumumab 12/24/2020 Cycle 8 FOLFIRI/Panitumumab 01/16/2021--treatment held due to hypertension in the infusion area. Cycle 8 FOLFIRI/panitumumab 01/21/2021 Cycle 9 FOLFIRI/panitumumab 02/11/2021 Cycle 10 FOLFIRI/Panitumumab 03/04/2021 Cycle 11 FOLFIRI/Panitumumab 03/25/2021 CT abdomen/pelvis 04/10/2021-stable right omental and left posterior paracolic gutter nodules Cycle 12 FOLFIRI/panitumumab 04/15/2021 Cycle 13 FOLFIRI/panitumumab 05/06/2021 Cycle 14 FOLFIRI/Panitumumab 05/27/2021 Cycle 15 FOLFIRI/Panitumumab 06/17/2021 CT abdomen/pelvis 07/05/2021-slight enlargement of a dominant right omental implant, no new implants Patient requested a treatment break CT abdomen/pelvis 10/04/2021-mild increase in size of multiple omental soft tissue nodules, 4 mm calculus in the distal left ureter adjacent to the ureteral stent Cycle 1 Lonsurf 10/14/2021 10/28/2021 Avastin every 2 weeks Cycle 2 Lonsurf 11/11/2021 Cycle 3 Lonsurf 12/09/2021 Cycle 4 Lonsurf 01/06/2022 Avastin held 01/20/2022 due to proteinuria, 24-hour urine 43 mg CT/pelvis 01/30/2022-possible new peritoneal implant at the transverse colon, no significant change in other omental/peritoneal implants, stable chronic rectal wall thickening, status post removal of double-J left ureteral stent with no evidence of hydronephrosis or ureteral calculus, nonobstructing bilateral renal calculi Cycle 5 Lonsurf 02/05/2022 or 02/06/2022 Avastin every 2 weeks Cycle 6 Lonsurf 03/03/2022 CTs  04/05/2022-increase in size and number of peritoneal implants compared to January 2024 central liver mass narrowing the anterior branch of the right portal vein, decreased biliary duct dilation, biliary stents in place, stable right cardiophrenic lymph nodes, separate pigtail stent in the lumen of the proximal duodenum   Hypertension G4 P3, twins Kidney stones-bilateral staghorn renal calculi on CT 08/10/2020 Infected Port-A-Cath 09/12/2019-placed on Augmentin, referred for Port-A-Cath removal; Port-A-Cath removed 09/14/2019; PICC line placed 09/14/2019; culture staph aureus.  Course of Septra completed. Neutropenia secondary to chemotherapy-Udenyca added with cycle 8 FOLFIRI Right nephrostomy tube  09/12/2020; stent placed 10/09/2020, percutaneous nephrostolithotomy treatment of right-sided kidney stones 10/09/2020, malpositioned right ureter stent replacement 10/23/2020, right ureter stent removed 10/29/2020 Percutaneous left nephrostolithotomy 10/01/2021-no renal stone identified, left ureter stent left in place Cystoscopy 10/14/2021-stent removed 8.  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  There is clinical and radiologic evidence of disease progression.  We discussed treatment options.  She has not received oxaliplatin.  I recommend FOLFOX.  We reviewed potential toxicities associated with the FOLFOX regimen including the chance of nausea/vomiting, mucositis,  diarrhea, hematologic toxicity, infection, and bleeding.  We discussed the allergic reaction and various types of neuropathy associated with oxaliplatin.  She agrees to proceed.  April [name] will undergo PICC placement for administration of chemotherapy.  She will be referred for a plain abdominal x-ray today to follow-up on the pancreas stent position.  The plan is to begin 1 FOLFOX on 04/14/2022.  She will be scheduled for an office visit and chemotherapy on 04/28/2022.  Betsy Coder, MD  04/07/2022  2:02 PM

## 2022-04-07 NOTE — Progress Notes (Signed)
DISCONTINUE ON PATHWAY REGIMEN - Colorectal     A cycle is every 28 days:     Trifluridine and tipiracil      Bevacizumab-xxxx   **Always confirm dose/schedule in your pharmacy ordering system**  REASON: Disease Progression PRIOR TREATMENT: [id number]: Trifluridine/Tipiracil 35 mg/m2 BID D1-5, 8-12 + Bevacizumab 5 mg/kg D1, 15 q28 Days TREATMENT RESPONSE: Progressive Disease (PD)  START ON PATHWAY REGIMEN - Colorectal     A cycle is every 14 days:     Oxaliplatin      Leucovorin      Fluorouracil      Fluorouracil   **Always confirm dose/schedule in your pharmacy ordering system**  Patient Characteristics: Distant Metastases, Nonsurgical Candidate, KRAS/NRAS Wild-Type (BRAF V600 Wild-Type/Unknown), Standard Cytotoxic Therapy, Third Line Standard Cytotoxic Therapy, Prior Anti-EGFR Therapy Tumor Location: Colon Therapeutic Status: Distant Metastases Microsatellite/Mismatch Repair Status: MSS/pMMR BRAF Mutation Status: Wild-Type (no mutation) KRAS/NRAS Mutation Status: Wild-Type (no mutation) Preferred Therapy Approach: Standard Cytotoxic Therapy Standard Cytotoxic Line of Therapy: Third Building services engineer Cytotoxic Therapy Intent of Therapy: Non-Curative / Palliative Intent, Discussed with Patient

## 2022-04-07 NOTE — Progress Notes (Signed)
Pharmacist Chemotherapy Monitoring - Initial Assessment    Anticipated start date: 04/14/22   The following has been reviewed per standard work regarding the patient's treatment regimen: The patient's diagnosis, treatment plan and drug doses, and organ/hematologic function Lab orders and baseline tests specific to treatment regimen  The treatment plan start date, drug sequencing, and pre-medications Prior authorization status  Patient's documented medication list, including drug-drug interaction screen and prescriptions for anti-emetics and supportive care specific to the treatment regimen The drug concentrations, fluid compatibility, administration routes, and timing of the medications to be used The patient's access for treatment and lifetime cumulative dose history, if applicable  The patient's medication allergies and previous infusion related reactions, if applicable   Changes made to treatment plan:  N/A  Follow up needed:  Pending authorization for treatment    Kennith Center, Pharm.D., CPP 04/07/2022@10 :39 AM

## 2022-04-08 ENCOUNTER — Encounter: Payer: Self-pay | Admitting: *Deleted

## 2022-04-09 ENCOUNTER — Ambulatory Visit: Payer: Self-pay

## 2022-04-09 NOTE — Patient Instructions (Signed)
Visit Information  Thank you for taking time to visit with me today. Please don't hesitate to contact me if I can be of assistance to you.   Following are the goals we discussed today:   Goals Addressed             This Visit's Progress    To continue cancer treatment without complications       Care Coordination Interventions: Assessed patient understanding of cancer diagnosis and recommended treatment plan Reviewed upcoming provider appointments and treatment appointments Assessed available transportation to appointments and treatments. Has consistent/reliable transportation: Yes Assessed support system. Has consistent/reliable family or other support: Yes Nutrition assessment performed        To get BP cuff calibrated       Care Coordination Interventions: Evaluation of current treatment plan related to hypertension self management and patient's adherence to plan as established by provider Reviewed medications with patient and discussed importance of compliance Advised patient to discuss checking accuracy of BP cuff  with provider Educated patient about her UHC OTC benefit in order to purchase a new BP cuff if needed, patient verbalizes understanding and confirms having a [facility] catalog            Our next appointment is by telephone on 05/08/22 at 1:00 PM  Please call the care guide team at [phone] if you need to cancel or reschedule your appointment.   If you are experiencing a Mental Health or [place] or need someone to talk to, please call 1-800-273-TALK (toll free, 24 hour hotline) go to [facility] [address], [place] [phone])  Patient verbalizes understanding of instructions and care plan provided today and agrees to view in [place]. Active MyChart status and patient understanding of how to access instructions and care plan via MyChart confirmed with patient.     Barb Merino, RN, BSN, CCM Care  Management Coordinator [facility] Care Management Direct Phone: [phone]

## 2022-04-09 NOTE — Patient Outreach (Signed)
  Care Coordination   Initial Visit Note   04/09/2022 Name: April [name] MRN: [id number] DOB: 01/05/1976  April [name] is a 47 y.o. year old female who sees Early, Coralee Pesa, NP for primary care. I spoke with  April [name] by phone today.  What matters to the patients health and wellness today?  Patient would like to continue her Cancer treatment without complications.     Goals Addressed             This Visit's Progress    To continue cancer treatment without complications       Care Coordination Interventions: Assessed patient understanding of cancer diagnosis and recommended treatment plan Reviewed upcoming provider appointments and treatment appointments Assessed available transportation to appointments and treatments. Has consistent/reliable transportation: Yes Assessed support system. Has consistent/reliable family or other support: Yes Nutrition assessment performed        To get BP cuff calibrated       Care Coordination Interventions: Evaluation of current treatment plan related to hypertension self management and patient's adherence to plan as established by provider Reviewed medications with patient and discussed importance of compliance Advised patient to discuss checking accuracy of BP cuff  with provider Educated patient about her UHC OTC benefit in order to purchase a new BP cuff if needed, patient verbalizes understanding and confirms having a [employer] catalog        Interventions Today    Flowsheet Row Most Recent Value  Chronic Disease   Chronic disease during today's visit Other, Hypertension (HTN)  [Cancer]  General Interventions   General Interventions Discussed/Reviewed General Interventions Discussed, General Interventions Reviewed, Doctor Visits  Doctor Visits Discussed/Reviewed Doctor Visits Discussed, Doctor Visits Reviewed, PCP, Specialist  Education Interventions   Provided Verbal Education On Nutrition, When to see the doctor   Nutrition Interventions   Nutrition Discussed/Reviewed Nutrition Discussed, Nutrition Reviewed, Portion sizes          SDOH assessments and interventions completed:  Yes  SDOH Interventions Today    Flowsheet Row Most Recent Value  SDOH Interventions   Transportation Interventions Intervention Not Indicated        Care Coordination Interventions:  Yes, provided   Follow up plan: Follow up call scheduled for 05/08/22 @1 :00 PM    Encounter Outcome:  Pt. Visit Completed

## 2022-04-11 ENCOUNTER — Other Ambulatory Visit: Payer: Self-pay | Admitting: Oncology

## 2022-04-11 ENCOUNTER — Ambulatory Visit (HOSPITAL_COMMUNITY)
Admission: RE | Admit: 2022-04-11 | Discharge: 2022-04-11 | Disposition: A | Discharge status: Home / Self Care | Payer: 59 | Source: Ambulatory Visit | Attending: Oncology | Admitting: Oncology

## 2022-04-11 DIAGNOSIS — C189 Malignant neoplasm of colon, unspecified: Secondary | ICD-10-CM | POA: Diagnosis not present

## 2022-04-11 DIAGNOSIS — C19 Malignant neoplasm of rectosigmoid junction: Secondary | ICD-10-CM | POA: Insufficient documentation

## 2022-04-11 DIAGNOSIS — I10 Essential (primary) hypertension: Secondary | ICD-10-CM

## 2022-04-11 DIAGNOSIS — Z452 Encounter for adjustment and management of vascular access device: Secondary | ICD-10-CM | POA: Diagnosis not present

## 2022-04-11 DIAGNOSIS — I82B22 Chronic embolism and thrombosis of left subclavian vein: Secondary | ICD-10-CM | POA: Diagnosis not present

## 2022-04-11 HISTORY — PX: IR VENO/EXT/UNI RIGHT: IMG676

## 2022-04-11 MED ORDER — HEPARIN SOD (PORK) LOCK FLUSH 100 UNIT/ML IV SOLN
INTRAVENOUS | Status: AC
  Administered 2022-04-11: 500 [IU]
  Filled 2022-04-11: qty 5

## 2022-04-11 MED ORDER — LIDOCAINE HCL 1 % IJ SOLN
INTRAMUSCULAR | Status: AC
  Administered 2022-04-11 (×2): 2 mL via INTRADERMAL
  Filled 2022-04-11: qty 20

## 2022-04-11 MED ORDER — IOHEXOL 300 MG/ML  SOLN
50.0000 mL | Freq: Once | INTRAMUSCULAR | Status: AC | PRN
  Administered 2022-04-11: 5 mL via INTRAVENOUS

## 2022-04-11 NOTE — Procedures (Signed)
Interventional Radiology Procedure:   Indications: Stage IV colorectal cancer  Procedure: 1) Right upper extremity venogram 2) Left arm PICC line placement  Findings: Occluded right subclavian vein.  Left arm basilic dual lumen PICC placed, 39 cm, tip at SVC/RA junction.  Complications: None     EBL: Minimal  Plan: PICC line is ready for use.   Right arm can no longer be used for PICC lines.    Ladaja Yusupov R. Anselm Pancoast, MD  Pager: [phone]

## 2022-04-13 ENCOUNTER — Other Ambulatory Visit: Payer: Self-pay | Admitting: Oncology

## 2022-04-14 ENCOUNTER — Inpatient Hospital Stay: Payer: 59

## 2022-04-14 ENCOUNTER — Inpatient Hospital Stay: Payer: 59 | Attending: Oncology

## 2022-04-14 VITALS — BP 150/90 | HR 82 | Temp 97.7°F | Resp 18 | Ht 64.0 in | Wt 153.2 lb

## 2022-04-14 DIAGNOSIS — Z5111 Encounter for antineoplastic chemotherapy: Secondary | ICD-10-CM | POA: Insufficient documentation

## 2022-04-14 DIAGNOSIS — C20 Malignant neoplasm of rectum: Secondary | ICD-10-CM

## 2022-04-14 DIAGNOSIS — Z452 Encounter for adjustment and management of vascular access device: Secondary | ICD-10-CM | POA: Insufficient documentation

## 2022-04-14 DIAGNOSIS — C189 Malignant neoplasm of colon, unspecified: Secondary | ICD-10-CM | POA: Diagnosis not present

## 2022-04-14 DIAGNOSIS — C786 Secondary malignant neoplasm of retroperitoneum and peritoneum: Secondary | ICD-10-CM | POA: Diagnosis not present

## 2022-04-14 DIAGNOSIS — C19 Malignant neoplasm of rectosigmoid junction: Secondary | ICD-10-CM | POA: Diagnosis not present

## 2022-04-14 DIAGNOSIS — E876 Hypokalemia: Secondary | ICD-10-CM | POA: Diagnosis not present

## 2022-04-14 DIAGNOSIS — Z8249 Family history of ischemic heart disease and other diseases of the circulatory system: Secondary | ICD-10-CM | POA: Diagnosis not present

## 2022-04-14 DIAGNOSIS — I10 Essential (primary) hypertension: Secondary | ICD-10-CM | POA: Diagnosis not present

## 2022-04-14 DIAGNOSIS — Z833 Family history of diabetes mellitus: Secondary | ICD-10-CM | POA: Diagnosis not present

## 2022-04-14 DIAGNOSIS — D6481 Anemia due to antineoplastic chemotherapy: Secondary | ICD-10-CM | POA: Diagnosis not present

## 2022-04-14 DIAGNOSIS — R0789 Other chest pain: Secondary | ICD-10-CM | POA: Diagnosis not present

## 2022-04-14 DIAGNOSIS — R112 Nausea with vomiting, unspecified: Secondary | ICD-10-CM | POA: Diagnosis not present

## 2022-04-14 DIAGNOSIS — R110 Nausea: Secondary | ICD-10-CM | POA: Diagnosis not present

## 2022-04-14 DIAGNOSIS — Z5986 Financial insecurity: Secondary | ICD-10-CM | POA: Diagnosis not present

## 2022-04-14 DIAGNOSIS — Z87442 Personal history of urinary calculi: Secondary | ICD-10-CM | POA: Diagnosis not present

## 2022-04-14 DIAGNOSIS — I160 Hypertensive urgency: Secondary | ICD-10-CM | POA: Diagnosis not present

## 2022-04-14 DIAGNOSIS — R748 Abnormal levels of other serum enzymes: Secondary | ICD-10-CM | POA: Diagnosis not present

## 2022-04-14 DIAGNOSIS — C187 Malignant neoplasm of sigmoid colon: Secondary | ICD-10-CM | POA: Insufficient documentation

## 2022-04-14 DIAGNOSIS — D630 Anemia in neoplastic disease: Secondary | ICD-10-CM | POA: Diagnosis not present

## 2022-04-14 DIAGNOSIS — Z5982 Transportation insecurity: Secondary | ICD-10-CM | POA: Diagnosis not present

## 2022-04-14 DIAGNOSIS — Z79899 Other long term (current) drug therapy: Secondary | ICD-10-CM | POA: Diagnosis not present

## 2022-04-14 DIAGNOSIS — Z888 Allergy status to other drugs, medicaments and biological substances status: Secondary | ICD-10-CM | POA: Diagnosis not present

## 2022-04-14 DIAGNOSIS — D72829 Elevated white blood cell count, unspecified: Secondary | ICD-10-CM | POA: Diagnosis not present

## 2022-04-14 DIAGNOSIS — T451X5A Adverse effect of antineoplastic and immunosuppressive drugs, initial encounter: Secondary | ICD-10-CM | POA: Diagnosis not present

## 2022-04-14 DIAGNOSIS — K831 Obstruction of bile duct: Secondary | ICD-10-CM | POA: Diagnosis not present

## 2022-04-14 DIAGNOSIS — N200 Calculus of kidney: Secondary | ICD-10-CM | POA: Diagnosis not present

## 2022-04-14 DIAGNOSIS — R000 Tachycardia, unspecified: Secondary | ICD-10-CM | POA: Diagnosis not present

## 2022-04-14 DIAGNOSIS — K2100 Gastro-esophageal reflux disease with esophagitis, without bleeding: Secondary | ICD-10-CM | POA: Diagnosis not present

## 2022-04-14 DIAGNOSIS — R7989 Other specified abnormal findings of blood chemistry: Secondary | ICD-10-CM | POA: Diagnosis not present

## 2022-04-14 DIAGNOSIS — Z87891 Personal history of nicotine dependence: Secondary | ICD-10-CM | POA: Diagnosis not present

## 2022-04-14 DIAGNOSIS — Z823 Family history of stroke: Secondary | ICD-10-CM | POA: Diagnosis not present

## 2022-04-14 DIAGNOSIS — Z5941 Food insecurity: Secondary | ICD-10-CM | POA: Diagnosis not present

## 2022-04-14 LAB — CMP (CANCER CENTER ONLY)
ALT: 16 U/L (ref 0–44)
AST: 22 U/L (ref 15–41)
Albumin: 4.1 g/dL (ref 3.5–5.0)
Alkaline Phosphatase: 195 U/L — ABNORMAL HIGH (ref 38–126)
Anion gap: 8 (ref 5–15)
BUN: 16 mg/dL (ref 6–20)
CO2: 27 mmol/L (ref 22–32)
Calcium: 9.3 mg/dL (ref 8.9–10.3)
Chloride: 106 mmol/L (ref 98–111)
Creatinine: 0.63 mg/dL (ref 0.44–1.00)
GFR, Estimated: >60 mL/min (ref >60)
Glucose, Bld: 95 mg/dL (ref 70–99)
Potassium: 3.3 mmol/L — ABNORMAL LOW (ref 3.5–5.1)
Sodium: 141 mmol/L (ref 135–145)
Total Bilirubin: 0.8 mg/dL (ref 0.3–1.2)
Total Protein: 7 g/dL (ref 6.5–8.1)

## 2022-04-14 LAB — CBC WITH DIFFERENTIAL (CANCER CENTER ONLY)
Abs Immature Granulocytes: 0.01 10*3/uL (ref 0.00–0.07)
Basophils Absolute: 0.1 10*3/uL (ref 0.0–0.1)
Basophils Relative: 1 %
Eosinophils Absolute: 0.4 10*3/uL (ref 0.0–0.5)
Eosinophils Relative: 6 %
HCT: 34.6 % — ABNORMAL LOW (ref 36.0–46.0)
Hemoglobin: 11.4 g/dL — ABNORMAL LOW (ref 12.0–15.0)
Immature Granulocytes: 0 %
Lymphocytes Relative: 34 %
Lymphs Abs: 2.4 10*3/uL (ref 0.7–4.0)
MCH: 31.3 pg (ref 26.0–34.0)
MCHC: 32.9 g/dL (ref 30.0–36.0)
MCV: 95.1 fL (ref 80.0–100.0)
Monocytes Absolute: 0.5 10*3/uL (ref 0.1–1.0)
Monocytes Relative: 7 %
Neutro Abs: 3.7 10*3/uL (ref 1.7–7.7)
Neutrophils Relative %: 52 %
Platelet Count: 265 10*3/uL (ref 150–400)
RBC: 3.64 MIL/uL — ABNORMAL LOW (ref 3.87–5.11)
RDW: 13.9 % (ref 11.5–15.5)
WBC Count: 7 10*3/uL (ref 4.0–10.5)
nRBC: 0 % (ref 0.0–0.2)

## 2022-04-14 LAB — CEA (ACCESS): CEA (CHCC): 14.87 ng/mL — ABNORMAL HIGH (ref 0.00–5.00)

## 2022-04-14 MED ORDER — DEXTROSE 5 % IV SOLN: Freq: Once | INTRAVENOUS | Status: AC

## 2022-04-14 MED ORDER — ACETAMINOPHEN 325 MG PO TABS
650.0000 mg | ORAL_TABLET | Freq: Once | ORAL | Status: AC
  Administered 2022-04-14: 650 mg via ORAL
  Filled 2022-04-14: qty 2

## 2022-04-14 MED ORDER — OXALIPLATIN CHEMO INJECTION 100 MG/20ML
85.0000 mg/m2 | Freq: Once | INTRAVENOUS | Status: AC
  Administered 2022-04-14: 150 mg via INTRAVENOUS
  Filled 2022-04-14: qty 20

## 2022-04-14 MED ORDER — SODIUM CHLORIDE 0.9 % IV SOLN
1800.0000 mg/m2 | INTRAVENOUS | Status: DC
  Administered 2022-04-14: 3500 mg via INTRAVENOUS
  Filled 2022-04-14: qty 70

## 2022-04-14 MED ORDER — FLUOROURACIL CHEMO INJECTION 2.5 GM/50ML
400.0000 mg/m2 | Freq: Once | INTRAVENOUS | Status: AC
  Administered 2022-04-14: 700 mg via INTRAVENOUS
  Filled 2022-04-14: qty 14

## 2022-04-14 MED ORDER — SODIUM CHLORIDE 0.9 % IV SOLN
10.0000 mg | Freq: Once | INTRAVENOUS | Status: AC
  Administered 2022-04-14: 10 mg via INTRAVENOUS
  Filled 2022-04-14: qty 10

## 2022-04-14 MED ORDER — LEUCOVORIN CALCIUM INJECTION 350 MG
400.0000 mg/m2 | Freq: Once | INTRAVENOUS | Status: AC
  Administered 2022-04-14: 704 mg via INTRAVENOUS
  Filled 2022-04-14: qty 35.2

## 2022-04-14 MED ORDER — PALONOSETRON HCL INJECTION 0.25 MG/5ML
0.2500 mg | Freq: Once | INTRAVENOUS | Status: AC
  Administered 2022-04-14: 0.25 mg via INTRAVENOUS
  Filled 2022-04-14: qty 5

## 2022-04-14 NOTE — Patient Instructions (Signed)
[facility]  Discharge Instructions: Thank you for choosing [facility] to provide your oncology and hematology care.   If you have a lab appointment with the [facility], please go directly to the [facility] and check in at the registration area.   Wear comfortable clothing and clothing appropriate for easy access to any Portacath or PICC line.   We strive to give you quality time with your provider. You may need to reschedule your appointment if you arrive late (15 or more minutes).  Arriving late affects you and other patients whose appointments are after yours.  Also, if you miss three or more appointments without notifying the office, you may be dismissed from the clinic at the provider's discretion.      For prescription refill requests, have your pharmacy contact our office and allow 72 hours for refills to be completed.    Today you received the following chemotherapy and/or immunotherapy agents Oxaliplatin, Leucovorin and Adrcuil      To help prevent nausea and vomiting after your treatment, we encourage you to take your nausea medication as directed.  BELOW ARE SYMPTOMS THAT SHOULD BE REPORTED IMMEDIATELY: *FEVER GREATER THAN 100.4 F (38 C) OR HIGHER *CHILLS OR SWEATING *NAUSEA AND VOMITING THAT IS NOT CONTROLLED WITH YOUR NAUSEA MEDICATION *UNUSUAL SHORTNESS OF BREATH *UNUSUAL BRUISING OR BLEEDING *URINARY PROBLEMS (pain or burning when urinating, or frequent urination) *BOWEL PROBLEMS (unusual diarrhea, constipation, pain near the anus) TENDERNESS IN MOUTH AND THROAT WITH OR WITHOUT PRESENCE OF ULCERS (sore throat, sores in mouth, or a toothache) UNUSUAL RASH, SWELLING OR PAIN  UNUSUAL VAGINAL DISCHARGE OR ITCHING   Items with * indicate a potential emergency and should be followed up as soon as possible or go to the Emergency Department if any problems should occur.  Please show the CHEMOTHERAPY ALERT CARD or  IMMUNOTHERAPY ALERT CARD at check-in to the Emergency Department and triage nurse.  Should you have questions after your visit or need to cancel or reschedule your appointment, please contact [facility]  Dept: [phone]  and follow the prompts.  Office hours are 8:00 a.m. to 4:30 p.m. Monday - Friday. Please note that voicemails left after 4:00 p.m. may not be returned until the following business day.  We are closed weekends and major holidays. You have access to a nurse at all times for urgent questions. Please call the main number to the clinic Dept: [phone] and follow the prompts.   For any non-urgent questions, you may also contact your provider using MyChart. We now offer e-Visits for anyone 30 and older to request care online for non-urgent symptoms. For details visit [email or web address].   Also download the MyChart app! Go to the app store, search "MyChart", open the app, select [facility], and log in with your MyChart username and password.

## 2022-04-14 NOTE — Progress Notes (Signed)
Pt reports no BM since 04/04/22. Per Cristy Friedlander, RN pt instructed to take her Lactulose BID until good BM then take once daily. Pt verbalizes understanding and agrees with plan of care. Pt verbalizes that she has this medication at home already.

## 2022-04-14 NOTE — Patient Instructions (Signed)
Central Line, Adult A central line is a long, thin tube (catheter) that can be used to collect blood for testing or to give medicine through a vein. The tip of the central line ends in a large vein just above the heart (vena cava). A central line may be placed because: You need to get medicines or fluids through an IV for a long period of time. You need nutrition but cannot eat or absorb nutrients. The veins in your hands or arms are difficult to use for IV access. You need a blood transfusion. You need chemotherapy or dialysis. Types of central lines There are four main types of central lines: Peripherally inserted central catheter (PICC) line. This type is used for access of one week or longer. It can be used to draw blood and give fluids or medicines. A PICC looks like an IV tube, but it goes up the arm to the heart. It is usually inserted in the upper arm and taped in place on the arm. Tunneled central line. This type is used for long-term therapy and dialysis. It is placed in a large vein in the neck, chest, or groin. It is inserted through a small incision made over the vein, and then it is advanced to the heart. It is tunneled under the skin and brought out through a second incision. Non-tunneled central line. This type is used for short-term access, usually for a maximum of 7 days. It is often used in the emergency department. It is inserted in the neck, chest, or groin. Implanted port. This type is used for long-term therapy. It can stay in place longer than other types of central lines. It is normally inserted in the upper chest, but it can also be placed in the upper arm or the abdomen. It is inserted and removed with surgery, and it is accessed using a needle. The type of central line that you receive depends on how long you will need it, your medical condition, and the condition of your veins. Tell a health care provider about: Any allergies you have. All medicines you are taking,  including vitamins, herbs, eye drops, creams, and over-the-counter medicines. Any problems you or family members have had with anesthetic medicines. Any blood disorders you have. Any surgeries you have had. Any medical conditions you have. Whether you are pregnant or may be pregnant. What are the risks? Generally, placement and use of a central line is safe. However, problems may occur, including: Infection. A blood clot that blocks the central line or forms in the vein and travels to the heart. Bleeding from the place where the central line was inserted. Developing a hole or crack within the central line. If this happens, the central line will need to be replaced. Central line failure. The catheter moving or coming out of place. What happens before the procedure? Medicines Ask your health care provider about: Changing or stopping your regular medicines. This is especially important if you are taking diabetes medicines or blood thinners. Taking medicines such as aspirin and ibuprofen. These medicines can thin your blood. Do not take these medicines unless your health care provider tells you to take them. Taking over-the-counter medicines, vitamins, herbs, and supplements. General instructions Follow instructions from your health care provider about eating or drinking restrictions. Ask your health care provider: How your procedure site will be marked. What steps will be taken to help prevent infection. These steps may include: Removing hair at the procedure site. Washing skin with a germ-killing soap.   Plan to have a responsible adult take you home from the hospital or clinic. If you will be going home right after the procedure, plan to have a responsible adult care for you for the time you are told. This is important. What happens during the procedure? The procedure will vary depending on the type of central line being placed. In general: An IV will be inserted into one of your  veins. You will be given one or more of the following: A medicine to help you relax (sedative). A medicine to numb the area (local anesthetic). Your skin will be cleaned with a germ-killing (antiseptic) solution, and you may be covered with sterile drapes. Your blood pressure, heart rate, breathing rate, and blood oxygen level will be monitored during the procedure. The central line catheter will be inserted into the vein and advanced to the correct spot. The health care provider may use X-ray equipment to help guide the catheter to the right place. A bandage (dressing) will be placed over the insertion area. The procedure may vary among health care providers and hospitals. What can I expect after the procedure? Your blood pressure, heart rate, breathing rate, and blood oxygen level will be monitored until you leave the hospital or clinic. Antiseptic caps may be placed on the ends of the central line tubing. If you were given a sedative during the procedure, it can affect you for several hours. Do not drive or operate machinery until your health care provider says that it is safe. Follow these instructions at home: Flushing and cleaning the central line  Follow instructions from your health care provider about flushing and cleaning the central line and the area around it. Only use sterile supplies to flush the central line. Use supplies from your health care provider, a pharmacy, or another source that is recommended by your health care provider. Before you flush the central line or clean the central line or the area around it: Wash your hands with soap and water for at least 20 seconds. If soap and water are not available, use alcohol-based hand sanitizer. Clean the central line hub with rubbing alcohol. Unless directed otherwise by the manufacturer's instructions, scrub using a twisting motion and rub for 10 to 15 seconds or for 30 twists. Be sure you scrub the top of the hub, not just the  sides. Never reuse alcohol pads. Let the hub dry before use. Prevent it from touching anything while drying. Caring for the incision or central line site Check your incision or central line site every day for signs of infection. Check for: Redness, swelling, or pain. Fluid or blood. Warmth. Pus or a bad smell. Keep the insertion site of your central line clean and dry at all times. Change your dressing only as told by your health care provider. Keep your dressing dry. If it gets wet, have it changed as soon as possible. General instructions Follow instructions from your health care provider for the type of device that you have. Keep the tube clamped, unless it is being used. If the central line accidentally gets pulled on, make sure: The dressing is okay. There is no bleeding. The line has not been pulled out. Do not use scissors or sharp objects near the tube. Do not take baths, swim, or use a hot tub until your health care provider approves. Ask your health care provider if you may take showers. You may only be allowed to take sponge baths. Ask your health care provider what activities are   safe for you. You may be restricted from lifting or making repetitive arm movements on the side of your central line. Take over-the-counter and prescription medicines only as told by your health care provider. Keep all follow-up visits. This is important. Storage and disposal of supplies Keep your supplies in a clean, dry location. Throw away any used syringes in a disposal container that is meant for sharp items (sharps container). You can buy a sharps container from a pharmacy, or you can make one by using an empty hard plastic bottle with a cover. Place any used dressings or infusion bags into a plastic bag. Throw that bag in the trash. Contact a health care provider if: You have redness, swelling, or pain around your insertion site. You have fluid or blood coming from your insertion site. Your  insertion site feels warm to the touch. You have pus or a bad smell coming from your insertion site. Get help right away if: You have: A fever or chills. Shortness of breath. Chest pain or a racing heartbeat. Swelling in your neck, face, chest, or arm on the side of your central line. You feel dizzy or you faint. Your incision or central line site has red streaks spreading away from the area. Your incision or central line site is bleeding and does not stop. Your central line is difficult to flush or will not flush. You do not get a blood return from the central line. Your central line gets loose or damaged or comes out. Your catheter leaks when flushed or when fluids are infused into it. Summary A central line is a long, thin tube (catheter) that can be used to give medicine through a vein. Follow specific instructions from your health care provider for the type of device that you have. Keep the insertion site of your central line clean and dry at all times. Keep the tube clamped, unless it is being used. This information is not intended to replace advice given to you by your health care provider. Make sure you discuss any questions you have with your health care provider. Document Revised: 09/01/2019 Document Reviewed: 09/01/2019 Elsevier Patient Education  2023 Elsevier Inc.  

## 2022-04-15 ENCOUNTER — Other Ambulatory Visit: Payer: Self-pay

## 2022-04-15 ENCOUNTER — Telehealth: Payer: Self-pay

## 2022-04-15 NOTE — Telephone Encounter (Signed)
FIRST TIME CHEMO CALL BACK  Telephone call to patient post her first time Oxaliplatin infusion. Patient reports having fatigue and nausea. She knows to take her nausea medication. Patient denies any other concerns at this time. She knows to call the clinic if anything changes. She will be back tomorrow for her pump stop appointment.

## 2022-04-16 ENCOUNTER — Inpatient Hospital Stay (HOSPITAL_COMMUNITY)
Admission: EM | Admit: 2022-04-16 | Discharge: 2022-04-18 | DRG: 392 | Disposition: A | Discharge status: Home / Self Care | Payer: 59 | Attending: Internal Medicine | Admitting: Internal Medicine

## 2022-04-16 ENCOUNTER — Inpatient Hospital Stay: Payer: 59

## 2022-04-16 ENCOUNTER — Other Ambulatory Visit: Payer: Self-pay

## 2022-04-16 ENCOUNTER — Encounter (HOSPITAL_COMMUNITY): Payer: Self-pay

## 2022-04-16 ENCOUNTER — Emergency Department (HOSPITAL_COMMUNITY): Payer: 59

## 2022-04-16 DIAGNOSIS — Z87442 Personal history of urinary calculi: Secondary | ICD-10-CM

## 2022-04-16 DIAGNOSIS — Z888 Allergy status to other drugs, medicaments and biological substances status: Secondary | ICD-10-CM

## 2022-04-16 DIAGNOSIS — C786 Secondary malignant neoplasm of retroperitoneum and peritoneum: Secondary | ICD-10-CM | POA: Diagnosis not present

## 2022-04-16 DIAGNOSIS — N200 Calculus of kidney: Secondary | ICD-10-CM | POA: Diagnosis not present

## 2022-04-16 DIAGNOSIS — Z5982 Transportation insecurity: Secondary | ICD-10-CM

## 2022-04-16 DIAGNOSIS — C20 Malignant neoplasm of rectum: Secondary | ICD-10-CM

## 2022-04-16 DIAGNOSIS — D72829 Elevated white blood cell count, unspecified: Secondary | ICD-10-CM | POA: Diagnosis present

## 2022-04-16 DIAGNOSIS — E876 Hypokalemia: Secondary | ICD-10-CM | POA: Diagnosis present

## 2022-04-16 DIAGNOSIS — C19 Malignant neoplasm of rectosigmoid junction: Secondary | ICD-10-CM | POA: Diagnosis present

## 2022-04-16 DIAGNOSIS — R7989 Other specified abnormal findings of blood chemistry: Secondary | ICD-10-CM | POA: Diagnosis present

## 2022-04-16 DIAGNOSIS — I10 Essential (primary) hypertension: Secondary | ICD-10-CM | POA: Diagnosis present

## 2022-04-16 DIAGNOSIS — R112 Nausea with vomiting, unspecified: Principal | ICD-10-CM | POA: Diagnosis present

## 2022-04-16 DIAGNOSIS — K2100 Gastro-esophageal reflux disease with esophagitis, without bleeding: Secondary | ICD-10-CM | POA: Diagnosis present

## 2022-04-16 DIAGNOSIS — T451X5A Adverse effect of antineoplastic and immunosuppressive drugs, initial encounter: Secondary | ICD-10-CM | POA: Diagnosis present

## 2022-04-16 DIAGNOSIS — R110 Nausea: Principal | ICD-10-CM

## 2022-04-16 DIAGNOSIS — D630 Anemia in neoplastic disease: Secondary | ICD-10-CM | POA: Diagnosis present

## 2022-04-16 DIAGNOSIS — Z5986 Financial insecurity: Secondary | ICD-10-CM

## 2022-04-16 DIAGNOSIS — Z823 Family history of stroke: Secondary | ICD-10-CM

## 2022-04-16 DIAGNOSIS — Z833 Family history of diabetes mellitus: Secondary | ICD-10-CM

## 2022-04-16 DIAGNOSIS — Z5941 Food insecurity: Secondary | ICD-10-CM

## 2022-04-16 DIAGNOSIS — I160 Hypertensive urgency: Secondary | ICD-10-CM | POA: Diagnosis present

## 2022-04-16 DIAGNOSIS — R748 Abnormal levels of other serum enzymes: Secondary | ICD-10-CM | POA: Diagnosis present

## 2022-04-16 DIAGNOSIS — X58XXXA Exposure to other specified factors, initial encounter: Secondary | ICD-10-CM | POA: Diagnosis present

## 2022-04-16 DIAGNOSIS — Z8249 Family history of ischemic heart disease and other diseases of the circulatory system: Secondary | ICD-10-CM

## 2022-04-16 DIAGNOSIS — D6481 Anemia due to antineoplastic chemotherapy: Secondary | ICD-10-CM | POA: Diagnosis present

## 2022-04-16 DIAGNOSIS — Z87891 Personal history of nicotine dependence: Secondary | ICD-10-CM

## 2022-04-16 DIAGNOSIS — Z79899 Other long term (current) drug therapy: Secondary | ICD-10-CM

## 2022-04-16 DIAGNOSIS — R0789 Other chest pain: Secondary | ICD-10-CM | POA: Diagnosis present

## 2022-04-16 LAB — CBC WITH DIFFERENTIAL/PLATELET
Abs Immature Granulocytes: 0.04 10*3/uL (ref 0.00–0.07)
Basophils Absolute: 0 10*3/uL (ref 0.0–0.1)
Basophils Relative: 0 %
Eosinophils Absolute: 0 10*3/uL (ref 0.0–0.5)
Eosinophils Relative: 0 %
HCT: 41.1 % (ref 36.0–46.0)
Hemoglobin: 13.6 g/dL (ref 12.0–15.0)
Immature Granulocytes: 0 %
Lymphocytes Relative: 8 %
Lymphs Abs: 0.9 10*3/uL (ref 0.7–4.0)
MCH: 31 pg (ref 26.0–34.0)
MCHC: 33.1 g/dL (ref 30.0–36.0)
MCV: 93.6 fL (ref 80.0–100.0)
Monocytes Absolute: 0.5 10*3/uL (ref 0.1–1.0)
Monocytes Relative: 4 %
Neutro Abs: 9.7 10*3/uL — ABNORMAL HIGH (ref 1.7–7.7)
Neutrophils Relative %: 88 %
Platelets: 278 10*3/uL (ref 150–400)
RBC: 4.39 MIL/uL (ref 3.87–5.11)
RDW: 13.7 % (ref 11.5–15.5)
WBC: 11.1 10*3/uL — ABNORMAL HIGH (ref 4.0–10.5)
nRBC: 0 % (ref 0.0–0.2)

## 2022-04-16 LAB — URINALYSIS, W/ REFLEX TO CULTURE (INFECTION SUSPECTED)
Bilirubin Urine: NEGATIVE
Glucose, UA: NEGATIVE mg/dL
Ketones, ur: NEGATIVE mg/dL
Nitrite: NEGATIVE
Protein, ur: 30 mg/dL — AB
Specific Gravity, Urine: 1.039 — ABNORMAL HIGH (ref 1.005–1.030)
pH: 5 (ref 5.0–8.0)

## 2022-04-16 LAB — TROPONIN I (HIGH SENSITIVITY)
Troponin I (High Sensitivity): 5 ng/L (ref <18)
Troponin I (High Sensitivity): 7 ng/L (ref <18)

## 2022-04-16 LAB — COMPREHENSIVE METABOLIC PANEL
ALT: 29 U/L (ref 0–44)
AST: 37 U/L (ref 15–41)
Albumin: 4.7 g/dL (ref 3.5–5.0)
Alkaline Phosphatase: 246 U/L — ABNORMAL HIGH (ref 38–126)
Anion gap: 14 (ref 5–15)
BUN: 19 mg/dL (ref 6–20)
CO2: 27 mmol/L (ref 22–32)
Calcium: 9.8 mg/dL (ref 8.9–10.3)
Chloride: 97 mmol/L — ABNORMAL LOW (ref 98–111)
Creatinine, Ser: 0.75 mg/dL (ref 0.44–1.00)
GFR, Estimated: >60 mL/min (ref >60)
Glucose, Bld: 119 mg/dL — ABNORMAL HIGH (ref 70–99)
Potassium: 2.9 mmol/L — ABNORMAL LOW (ref 3.5–5.1)
Sodium: 138 mmol/L (ref 135–145)
Total Bilirubin: 1.3 mg/dL — ABNORMAL HIGH (ref 0.3–1.2)
Total Protein: 8.7 g/dL — ABNORMAL HIGH (ref 6.5–8.1)

## 2022-04-16 LAB — LIPASE, BLOOD: Lipase: 33 U/L (ref 11–51)

## 2022-04-16 MED ORDER — POTASSIUM CHLORIDE IN NACL 40-0.9 MEQ/L-% IV SOLN
INTRAVENOUS | Status: DC
  Filled 2022-04-16 (×5): qty 1000

## 2022-04-16 MED ORDER — LABETALOL HCL 5 MG/ML IV SOLN
20.0000 mg | Freq: Once | INTRAVENOUS | Status: AC
  Administered 2022-04-16: 20 mg via INTRAVENOUS
  Filled 2022-04-16: qty 4

## 2022-04-16 MED ORDER — ENOXAPARIN SODIUM 40 MG/0.4ML IJ SOSY: 40.0000 mg | PREFILLED_SYRINGE | INTRAMUSCULAR | Status: DC

## 2022-04-16 MED ORDER — POLYETHYLENE GLYCOL 3350 17 G PO PACK: 17.0000 g | PACK | Freq: Every day | ORAL | Status: DC | PRN

## 2022-04-16 MED ORDER — POTASSIUM CHLORIDE 10 MEQ/100ML IV SOLN
10.0000 meq | Freq: Once | INTRAVENOUS | Status: AC
  Administered 2022-04-16: 10 meq via INTRAVENOUS
  Filled 2022-04-16: qty 100

## 2022-04-16 MED ORDER — METOCLOPRAMIDE HCL 5 MG/ML IJ SOLN
10.0000 mg | Freq: Once | INTRAMUSCULAR | Status: AC
  Administered 2022-04-16: 10 mg via INTRAVENOUS
  Filled 2022-04-16: qty 2

## 2022-04-16 MED ORDER — SODIUM CHLORIDE 0.9 % IV BOLUS
1000.0000 mL | Freq: Once | INTRAVENOUS | Status: AC
  Administered 2022-04-16: 1000 mL via INTRAVENOUS

## 2022-04-16 MED ORDER — BISACODYL 10 MG RE SUPP: 10.0000 mg | Freq: Every day | RECTAL | Status: DC | PRN

## 2022-04-16 MED ORDER — ACETAMINOPHEN 650 MG RE SUPP: 650.0000 mg | Freq: Four times a day (QID) | RECTAL | Status: DC | PRN

## 2022-04-16 MED ORDER — ONDANSETRON HCL 4 MG/2ML IJ SOLN
4.0000 mg | Freq: Once | INTRAMUSCULAR | Status: AC
  Administered 2022-04-16: 4 mg via INTRAVENOUS
  Filled 2022-04-16: qty 2

## 2022-04-16 MED ORDER — OXYCODONE HCL 5 MG PO TABS: 5.0000 mg | ORAL_TABLET | ORAL | Status: DC | PRN

## 2022-04-16 MED ORDER — SODIUM CHLORIDE 0.9 % IV SOLN: Freq: Once | INTRAVENOUS | Status: AC

## 2022-04-16 MED ORDER — HYDRALAZINE HCL 50 MG PO TABS
50.0000 mg | ORAL_TABLET | Freq: Three times a day (TID) | ORAL | Status: DC
  Administered 2022-04-16 – 2022-04-17 (×5): 50 mg via ORAL
  Filled 2022-04-16 (×2): qty 1
  Filled 2022-04-16: qty 2
  Filled 2022-04-16 (×2): qty 1

## 2022-04-16 MED ORDER — POTASSIUM CHLORIDE CRYS ER 20 MEQ PO TBCR
40.0000 meq | EXTENDED_RELEASE_TABLET | Freq: Once | ORAL | Status: AC
  Administered 2022-04-16: 40 meq via ORAL
  Filled 2022-04-16: qty 2

## 2022-04-16 MED ORDER — METOPROLOL TARTRATE 25 MG PO TABS
25.0000 mg | ORAL_TABLET | Freq: Two times a day (BID) | ORAL | Status: DC
  Administered 2022-04-16 – 2022-04-17 (×3): 25 mg via ORAL
  Filled 2022-04-16 (×3): qty 1

## 2022-04-16 MED ORDER — METOCLOPRAMIDE HCL 5 MG/ML IJ SOLN
10.0000 mg | Freq: Four times a day (QID) | INTRAMUSCULAR | Status: DC | PRN
  Administered 2022-04-17 – 2022-04-18 (×2): 10 mg via INTRAVENOUS
  Filled 2022-04-16 (×2): qty 2

## 2022-04-16 MED ORDER — IOHEXOL 300 MG/ML  SOLN
100.0000 mL | Freq: Once | INTRAMUSCULAR | Status: AC | PRN
  Administered 2022-04-16: 100 mL via INTRAVENOUS

## 2022-04-16 MED ORDER — ONDANSETRON HCL 4 MG/2ML IJ SOLN
4.0000 mg | Freq: Four times a day (QID) | INTRAMUSCULAR | Status: DC
  Administered 2022-04-17 – 2022-04-18 (×6): 4 mg via INTRAVENOUS
  Filled 2022-04-16 (×7): qty 2

## 2022-04-16 MED ORDER — MORPHINE SULFATE (PF) 4 MG/ML IV SOLN
4.0000 mg | Freq: Once | INTRAVENOUS | Status: AC
  Administered 2022-04-16: 4 mg via INTRAVENOUS
  Filled 2022-04-16: qty 1

## 2022-04-16 MED ORDER — PANTOPRAZOLE SODIUM 40 MG IV SOLR
40.0000 mg | Freq: Two times a day (BID) | INTRAVENOUS | Status: DC
  Administered 2022-04-16 – 2022-04-17 (×3): 40 mg via INTRAVENOUS
  Filled 2022-04-16 (×4): qty 10

## 2022-04-16 MED ORDER — AMLODIPINE BESYLATE 5 MG PO TABS
10.0000 mg | ORAL_TABLET | Freq: Every day | ORAL | Status: DC
  Administered 2022-04-16 – 2022-04-18 (×3): 10 mg via ORAL
  Filled 2022-04-16 (×3): qty 2

## 2022-04-16 MED ORDER — ONDANSETRON HCL 4 MG PO TABS: 4.0000 mg | ORAL_TABLET | Freq: Four times a day (QID) | ORAL | Status: DC | PRN

## 2022-04-16 MED ORDER — MORPHINE SULFATE (PF) 2 MG/ML IV SOLN
2.0000 mg | INTRAVENOUS | Status: DC | PRN
  Administered 2022-04-16 – 2022-04-18 (×6): 2 mg via INTRAVENOUS
  Filled 2022-04-16 (×6): qty 1

## 2022-04-16 MED ORDER — ACETAMINOPHEN 325 MG PO TABS: 650.0000 mg | ORAL_TABLET | Freq: Four times a day (QID) | ORAL | Status: DC | PRN

## 2022-04-16 MED ORDER — PROCHLORPERAZINE MALEATE 10 MG PO TABS
10.0000 mg | ORAL_TABLET | Freq: Once | ORAL | Status: AC
  Administered 2022-04-16: 10 mg via ORAL
  Filled 2022-04-16: qty 1

## 2022-04-16 MED ORDER — SENNOSIDES-DOCUSATE SODIUM 8.6-50 MG PO TABS
1.0000 | ORAL_TABLET | Freq: Two times a day (BID) | ORAL | Status: DC
  Administered 2022-04-16 – 2022-04-18 (×4): 1 via ORAL
  Filled 2022-04-16 (×4): qty 1

## 2022-04-16 MED ORDER — ONDANSETRON HCL 4 MG/2ML IJ SOLN: 4.0000 mg | Freq: Four times a day (QID) | INTRAMUSCULAR | Status: DC | PRN

## 2022-04-16 MED ORDER — ALUM & MAG HYDROXIDE-SIMETH 200-200-20 MG/5ML PO SUSP
30.0000 mL | Freq: Four times a day (QID) | ORAL | Status: DC | PRN
  Administered 2022-04-16 – 2022-04-17 (×2): 30 mL via ORAL
  Filled 2022-04-16 (×2): qty 30

## 2022-04-16 NOTE — ED Provider Notes (Signed)
[facility] EMERGENCY DEPARTMENT AT [facility] Provider Note   CSN: [id number] Arrival date & time: 04/16/22  1030     History {Add pertinent medical, surgical, social history, OB history to HPI:1} Chief Complaint  Patient presents with   Nausea   Emesis    April [name] is a 47 y.o. female.  47 year old female with prior medical history as detailed below presents for evaluation.  Patient with known history of stage IV colon cancer.  Patient reports nausea and vomiting associated mid abdominal discomfort since yesterday.  Patient reports recent stenting of her biliary tract on March 11 with Dr. Rush Landmark ([facility]).  She denies associated fever.  The history is provided by the patient and medical records.       Home Medications Prior to Admission medications   Medication Sig Start Date End Date Taking? Authorizing Provider  amLODipine (NORVASC) 10 MG tablet Take 1 tablet (10 mg total) by mouth daily. 07/18/21   Orma Render, NP  hydrALAZINE (APRESOLINE) 50 MG tablet Take 1 tablet (50 mg total) by mouth 3 (three) times daily. 03/27/22 06/25/22  Mariel Aloe, MD  lactulose (CHRONULAC) 10 GM/15ML solution Take 15 mLs (10 g total) by mouth 2 (two) times daily. Take twice daily until good bowel movement, then decrease to 15 ml once daily 04/04/22   Ladell Pier, MD  metoprolol tartrate (LOPRESSOR) 25 MG tablet Take 1 tablet (25 mg total) by mouth 2 (two) times daily. 04/07/22   Ladell Pier, MD  Multiple Vitamin (MULTIVITAMIN WITH MINERALS) TABS tablet Take 1 tablet by mouth daily. Patient not taking: Reported on 04/04/2022 03/28/22   Mariel Aloe, MD  oxyCODONE-acetaminophen (PERCOCET/ROXICET) 5-325 MG tablet Take 1 tablet by mouth every 4 (four) hours as needed for severe pain. 04/04/22   Ladell Pier, MD  pantoprazole (PROTONIX) 40 MG tablet Take 1 tablet (40 mg total) by mouth 2 (two) times daily before a meal for 60 days, THEN 1 tablet (40 mg total)  daily. 03/27/22 06/25/22  Mariel Aloe, MD  Plecanatide (TRULANCE) 3 MG TABS Take 3 mg by mouth daily as needed (for constipation). Patient not taking: Reported on 03/31/2022 09/30/21   [provider]  Polyethylene Glycol 3350 (MIRALAX PO) Take 17 g by mouth 2 (two) times daily.    [provider]  potassium chloride 20 MEQ/15ML (10%) SOLN Take 15 mLs (20 mEq total) by mouth 2 (two) times daily. Take 15 ml twice a day for 4 days, then daily Patient taking differently: Take 20 mEq by mouth 3 (three) times a week. 01/08/22   Owens Shark, NP  prochlorperazine (COMPAZINE) 10 MG tablet Take 1 tablet (10 mg total) by mouth every 6 (six) hours as needed for nausea or vomiting. 10/28/21   Owens Shark, NP  promethazine (PHENERGAN) 25 MG tablet Take 25 mg by mouth every 6 (six) hours as needed for nausea or vomiting. Patient not taking: Reported on 03/31/2022    [provider]      Allergies    Lisinopril, Irinotecan, and Leucovorin calcium    Review of Systems   Review of Systems  All other systems reviewed and are negative.   Physical Exam Updated Vital Signs BP (!) 177/142 (BP Location: Right Arm)   Pulse (!) 106   Temp 97.7 F (36.5 C) (Oral)   Resp 16   Ht 5\' 4"  (1.626 m)   Wt 69.4 kg   LMP 01/07/2006   SpO2  98%   BMI 26.26 kg/m  Physical Exam Vitals and nursing note reviewed.  Constitutional:      General: She is not in acute distress.    Appearance: She is well-developed.     Comments: Appears uncomfortable, complains of nausea  HENT:     Head: Normocephalic and atraumatic.  Eyes:     Conjunctiva/sclera: Conjunctivae normal.     Pupils: Pupils are equal, round, and reactive to light.  Cardiovascular:     Rate and Rhythm: Normal rate and regular rhythm.     Heart sounds: Normal heart sounds.  Pulmonary:     Effort: Pulmonary effort is normal. No respiratory distress.     Breath sounds: Normal breath sounds.  Abdominal:     General: There  is no distension.     Palpations: Abdomen is soft.     Tenderness: There is abdominal tenderness.     Comments: Mild tenderness in epigastrium  Musculoskeletal:        General: No deformity. Normal range of motion.     Cervical back: Normal range of motion and neck supple.  Skin:    General: Skin is warm and dry.  Neurological:     General: No focal deficit present.     Mental Status: She is alert and oriented to person, place, and time.     ED Results / Procedures / Treatments   Labs (all labs ordered are listed, but only abnormal results are displayed) Labs Reviewed  COMPREHENSIVE METABOLIC PANEL - Abnormal; Notable for the following components:      Result Value   Potassium 2.9 (*)    Chloride 97 (*)    Glucose, Bld 119 (*)    Total Protein 8.7 (*)    Alkaline Phosphatase 246 (*)    Total Bilirubin 1.3 (*)    All other components within normal limits  CBC WITH DIFFERENTIAL/PLATELET - Abnormal; Notable for the following components:   WBC 11.1 (*)    Neutro Abs 9.7 (*)    All other components within normal limits  LIPASE, BLOOD  URINALYSIS, W/ REFLEX TO CULTURE (INFECTION SUSPECTED)  TROPONIN I (HIGH SENSITIVITY)  TROPONIN I (HIGH SENSITIVITY)    EKG EKG Interpretation  Date/Time:  Wednesday April 16 2022 10:50:50 EDT Ventricular Rate:  102 PR Interval:  158 QRS Duration: 104 QT Interval:  378 QTC Calculation: 493 R Axis:   96 Text Interpretation: Sinus tachycardia Biatrial enlargement Borderline right axis deviation Left ventricular hypertrophy Borderline prolonged QT interval Confirmed by Dene Gentry [phone]) on 04/16/2022 10:57:20 AM  Radiology No results found.  Procedures Procedures  {Document cardiac monitor, telemetry assessment procedure when appropriate:1}  Medications Ordered in ED Medications  potassium chloride 10 mEq in 100 mL IVPB (has no administration in time range)  sodium chloride 0.9 % bolus 1,000 mL (0 mLs Intravenous Stopped 04/16/22  1306)  metoCLOPramide (REGLAN) injection 10 mg (10 mg Intravenous Given 04/16/22 1148)  prochlorperazine (COMPAZINE) tablet 10 mg (10 mg Oral Given 04/16/22 1314)    ED Course/ Medical Decision Making/ A&P   {   Click here for ABCD2, HEART and other calculatorsREFRESH Note before signing :1}                          Medical Decision Making Amount and/or Complexity of Data Reviewed Labs: ordered. Radiology: ordered.  Risk Prescription drug management.    Medical Screen Complete  This patient presented to the ED with complaint of nausea,  vomiting.  This complaint involves an extensive number of treatment options. The initial differential diagnosis includes, but is not limited to, metabolic abnormality, dehydration, AKI, biliary obstruction, etc.  This presentation is: Acute, Chronic, Self-Limited, Previously Undiagnosed, Uncertain Prognosis, Complicated, Systemic Symptoms, and Threat to Life/Bodily Function  Patient with known history of metastatic colon cancer and carcinomatosis presents with complaint of nausea and vomiting.  This is associated with epigastric abdominal discomfort.  Patient is moderately improved with antiemetics and IV fluids.  Patient's labs are remarkable for mildly increased alk phos at 246 and total bili at 1.3.  Patient's alkaline phosphatase and total bili appear to be upward trending after recent decline post stenting procedure.  Given patient's recent biliary stenting on March 11, case discussed with Dr. Lorenso Courier covering [facility].  Dr. Lorenso Courier request CT imaging to further evaluate possibility of recurrent biliary obstruction.   Oncoming EDP aware of pending CT / Disposition.    Additional history obtained:  External records from outside sources obtained and reviewed including prior ED visits and prior Inpatient records.    Lab Tests:  I ordered and personally interpreted labs.  The pertinent results include: CBC, CMP, lipase, troponin,  UA   Imaging Studies ordered:  I ordered imaging studies including CT abdomen pelvis    Cardiac Monitoring:  The patient was maintained on a cardiac monitor.  I personally viewed and interpreted the cardiac monitor which showed an underlying rhythm of: Sinus tach   Medicines ordered:  I ordered medication including Compazine, IV fluids, Reglan, Zofran, morphine for nausea, dehydration, pain Reevaluation of the patient after these medicines showed that the patient: improved    Problem List / ED Course:  Nausea, vomiting, elevated total bili, elevated alk phos   Reevaluation:  After the interventions noted above, I reevaluated the patient and found that they have: improved  Disposition:  After consideration of the diagnostic results and the patients response to treatment, I feel that the patent would benefit from completion of ED evaluation.    {Document critical care time when appropriate:1} {Document review of labs and clinical decision tools ie heart score, Chads2Vasc2 etc:1}  {Document your independent review of radiology images, and any outside records:1} {Document your discussion with family members, caretakers, and with consultants:1} {Document social determinants of health affecting pt's care:1} {Document your decision making why or why not admission, treatments were needed:1} Final Clinical Impression(s) / ED Diagnoses Final diagnoses:  Nausea  Nausea and vomiting, unspecified vomiting type    Rx / DC Orders ED Discharge Orders     None

## 2022-04-16 NOTE — ED Provider Notes (Signed)
  Physical Exam  BP (!) 160/119 (BP Location: Right Arm)   Pulse 84   Temp 98.8 F (37.1 C) (Oral)   Resp 18   Ht 5\' 4"  (1.626 m)   Wt 69.4 kg   LMP 01/07/2006   SpO2 100%   BMI 26.26 kg/m   Physical Exam  Procedures  Procedures  ED Course / MDM   Clinical Course as of 04/16/22 1905  Wed Apr 16, 2022  1458 Assumed care from Dr Francia Greaves. 47 yo F with hx of metastatic colon cancer with biliary stents and started having diffuse abdominal pain with elevated alp and bilirubin since last labs. Cf biliary tract obstruction. Dr Lorenso Courier was consulted and recommended CT which has been performed. Feels that pt may need to stay in the hospital.  [RP]  1700 CT without any acute findings aside from the fact that the patient's biliary stent may have migrated.  Patient still having intractable nausea and vomiting and with her hypokalemia concerned that she could go home and get worse.  Dr Starla Link from medicine will evaluate for admission.  Recommending blood pressure medication to be given so she was given 20 mg of labetalol. [RP]  1725 Dr Henrene Pastor from [facility] has reviewed the CT. Does not feel her symptoms are from the stent or that it requires intervention. Will send a team member to see her tomorrow.  [RP]    Clinical Course User Index [RP] Fransico Meadow, MD   Medical Decision Making Amount and/or Complexity of Data Reviewed Labs: ordered. Radiology: ordered.  Risk Prescription drug management. Decision regarding hospitalization.     Fransico Meadow, MD 04/16/22 Drema Halon

## 2022-04-16 NOTE — ED Triage Notes (Signed)
Pt BIB [facility] with c/o N/V, abdominal pain. Hx Stage 4 colon cancer. Started on different chemo drug. Had treatment yesterday and starting vomiting and severe stomach pain afterwards.   BP 144/98 HR 140 RR 18 97% RA  CBG 151

## 2022-04-16 NOTE — H&P (Signed)
History and Physical    April [name] [id number] DOB: 1975-09-09 DOA: 04/16/2022  PCP: Orma Render, NP   Patient coming from: Home  I have personally briefly reviewed patient's old medical records in [place]  Chief Complaint: Vomiting  HPI: April [name] is a 47 y.o. 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  Patient received FOLFOX on 04/14/2022 as per oncology at cancer center.  Following this, she has been having intermittent progressively worsening nausea and vomiting with very poor oral intake.  Denies any fever, cough, shortness of breath, diarrhea or dysuria.  No loss of consciousness or seizures.  ED Course: WBC of 11.1, potassium 2.9, alkaline phosphatase 246, bili total of 1.3 with normal AST and ALT.  CT of abdomen and pelvis with contrast showed no acute significant change from CT 04/04/2022; indwelling biliary catheters with mild biliary duct dilatation in the left hepatic lobe; migrated catheter and third portion duodenum, no change from prior.  Patient was given IV fluids, antiemetics and potassium replacement.  Patient was persistently nauseous.  EDP is trying to reach to gastroenterology on-call. Hospitalist service was called to evaluate the patient.  Review of Systems: As per HPI otherwise all other systems were reviewed and are negative.   Past Medical History:  Diagnosis Date   Benign essential hypertension 10/06/2017   Last Assessment & Plan:   Formatting of this note might be different from the original. Patient's blood pressure noted to be elevated at today's visit 188/98.  Patient will be provided with antihypertensives in the treatment center if needed in order to meet her Avastin parameters.  Patient recommended to follow up with her primary care physician regarding medication management.  Patient is not cur   Cancer    Colon cancer    Hypertension    Renal calculi    bilateral    Past Surgical History:  Procedure Laterality Date   BILIARY BRUSHING  03/24/2022   Procedure: BILIARY BRUSHING;  Surgeon: Rush Landmark Telford Nab., MD;  Location: [place];  Service: Gastroenterology;;   BILIARY DILATION  03/24/2022   Procedure: BILIARY DILATION;  Surgeon: Irving Copas., MD;  Location: [place];  Service: Gastroenterology;;   BILIARY STENT PLACEMENT  03/24/2022   Procedure: BILIARY STENT PLACEMENT;  Surgeon: Irving Copas., MD;  Location: [place];  Service: Gastroenterology;;   BIOPSY  03/24/2022   Procedure: BIOPSY;  Surgeon: Irving Copas., MD;  Location: [place];  Service: Gastroenterology;;   CESAREAN SECTION     x2   CYSTOSCOPY WITH RETROGRADE PYELOGRAM, URETEROSCOPY AND STENT PLACEMENT Right 10/22/2020   Procedure: CYSTOSCOPY WITH RETROGRADE PYELOGRAM, URETEROSCOPY AND STENT PLACEMENT;  Surgeon: Robley Fries, MD;  Location: [facility];  Service: Urology;  Laterality: Right;   ERCP N/A 03/24/2022   Procedure: ENDOSCOPIC RETROGRADE CHOLANGIOPANCREATOGRAPHY (ERCP);  Surgeon: Irving Copas., MD;  Location: [place];  Service: Gastroenterology;  Laterality: N/A;   IR RADIOLOGIST EVAL & MGMT  09/16/2019   IR RADIOLOGIST EVAL & MGMT  09/23/2019   IR RADIOLOGIST EVAL & MGMT  09/20/2019   IR RADIOLOGIST EVAL & MGMT  09/30/2019   IR REMOVAL TUN ACCESS W/  PORT W/O FL MOD SED  09/14/2019   IR VENO/EXT/UNI RIGHT  04/11/2022   PANCREATIC STENT PLACEMENT  03/24/2022   Procedure: PANCREATIC  STENT PLACEMENT;  Surgeon: Irving Copas., MD;  Location: [place];  Service: Gastroenterology;;   REMOVAL OF STONES  03/24/2022   Procedure: REMOVAL OF STONES;  Surgeon: Irving Copas., MD;  Location: [place];  Service: Gastroenterology;;   Joan Mayans  03/24/2022   Procedure: Joan Mayans;  Surgeon: Mansouraty, Telford Nab., MD;  Location: [place];  Service: Gastroenterology;;   URETERAL STENT PLACEMENT       reports that she has been smoking cigarettes. She has been smoking an average of .5 packs per day. She has never used smokeless tobacco. She reports current alcohol use. She reports that she does not currently use drugs.  Allergies  Allergen Reactions   Lisinopril Swelling and Other (See Comments)    Lip swelling and Angioedema   Irinotecan Other (See Comments)    Stomach cramping Patient given 0.5 mL atropine IV and was able to continue infusion.  See Progress note on 10/03/20.    Leucovorin Calcium Other (See Comments)    Stomach cramping Patient given 0.5 mL atropine IV and was able to continue infusion. See Progress note on 10/03/20.     Family History  Problem Relation Age of Onset   Hypertension Mother    Diabetes Mellitus II Father    Stroke Father    Ulcers Sister     Prior to Admission medications   Medication Sig Start Date End Date Taking? Authorizing Provider  amLODipine (NORVASC) 10 MG tablet Take 1 tablet (10 mg total) by mouth daily. 07/18/21   Orma Render, NP  hydrALAZINE (APRESOLINE) 50 MG tablet Take 1 tablet (50 mg total) by mouth 3 (three) times daily. 03/27/22 06/25/22  Mariel Aloe, MD  lactulose (CHRONULAC) 10 GM/15ML solution Take 15 mLs (10 g total) by mouth 2 (two) times daily. Take twice daily until good bowel movement, then decrease to 15 ml once daily 04/04/22   Ladell Pier, MD  metoprolol tartrate (LOPRESSOR) 25 MG tablet Take 1 tablet (25 mg total) by mouth 2 (two) times daily. 04/07/22   Ladell Pier, MD   Multiple Vitamin (MULTIVITAMIN WITH MINERALS) TABS tablet Take 1 tablet by mouth daily. Patient not taking: Reported on 04/04/2022 03/28/22   Mariel Aloe, MD  oxyCODONE-acetaminophen (PERCOCET/ROXICET) 5-325 MG tablet Take 1 tablet by mouth every 4 (four) hours as needed for severe pain. 04/04/22   Ladell Pier, MD  pantoprazole (PROTONIX) 40 MG tablet Take 1 tablet (40 mg total) by mouth 2 (two) times daily before a meal for 60 days, THEN 1 tablet (40 mg total) daily. 03/27/22 06/25/22  Mariel Aloe, MD  Plecanatide (TRULANCE) 3 MG TABS Take 3 mg by mouth daily as needed (for constipation). Patient not taking: Reported on 03/31/2022 09/30/21   [provider]  Polyethylene Glycol 3350 (MIRALAX PO) Take 17 g by mouth 2 (two) times daily.    [provider]  potassium chloride 20 MEQ/15ML (10%) SOLN Take 15 mLs (20 mEq total) by mouth 2 (two) times daily. Take 15 ml twice a day for 4 days, then daily Patient taking differently: Take 20 mEq by mouth 3 (three) times a week. 01/08/22   Owens Shark, NP  prochlorperazine (COMPAZINE) 10 MG tablet Take 1 tablet (10 mg total) by mouth every 6 (six) hours as needed for nausea or vomiting. 10/28/21  Owens Shark, NP  promethazine (PHENERGAN) 25 MG tablet Take 25 mg by mouth every 6 (six) hours as needed for nausea or vomiting. Patient not taking: Reported on 03/31/2022    [provider]    Physical Exam: Vitals:   04/16/22 1317 04/16/22 1400 04/16/22 1411 04/16/22 1700  BP:  (!) 171/135  (!) 190/129  Pulse:  (!) 106  86  Resp:  13  17  Temp:   98.3 F (36.8 C)   TempSrc:   Oral   SpO2:  98%  98%  Weight: 69.4 kg     Height: 5\' 4"  (1.626 m)       Constitutional: NAD, calm, comfortable.  Looks chronically ill and deconditioned.  Currently on room air. Vitals:   04/16/22 1317 04/16/22 1400 04/16/22 1411 04/16/22 1700  BP:  (!) 171/135  (!) 190/129  Pulse:  (!) 106  86  Resp:  13  17  Temp:   98.3 F (36.8  C)   TempSrc:   Oral   SpO2:  98%  98%  Weight: 69.4 kg     Height: 5\' 4"  (1.626 m)      Eyes: PERRL, lids and conjunctivae normal ENMT: Mucous membranes are dry.  Posterior pharynx clear of any exudate or lesions. Neck: normal, supple, no masses, no thyromegaly Respiratory: bilateral decreased breath sounds at bases, no wheezing, no crackles. Normal respiratory effort. No accessory muscle use.  Cardiovascular: S1 S2 positive, tachycardic. No extremity edema. 2+ pedal pulses.  Abdomen: Mild diffuse tenderness present, no masses palpated. No hepatosplenomegaly. Bowel sounds positive.  Musculoskeletal: no clubbing / cyanosis. No joint deformity upper and lower extremities.  Skin: no rashes, lesions, ulcers. No induration Neurologic: CN 2-12 grossly intact. Moving extremities. No focal neurologic deficits.  Psychiatric: Normal judgment and insight. Alert and oriented x 3. Normal mood.    Labs on Admission: I have personally reviewed following labs and imaging studies  CBC: Recent Labs  Lab 04/14/22 0845 04/16/22 1152  WBC 7.0 11.1*  NEUTROABS 3.7 9.7*  HGB 11.4* 13.6  HCT 34.6* 41.1  MCV 95.1 93.6  PLT 265 0000000   Basic Metabolic Panel: Recent Labs  Lab 04/14/22 0845 04/16/22 1152  NA 141 138  K 3.3* 2.9*  CL 106 97*  CO2 27 27  GLUCOSE 95 119*  BUN 16 19  CREATININE 0.63 0.75  CALCIUM 9.3 9.8   GFR: Estimated Creatinine Clearance: 82.3 mL/min (by C-G formula based on SCr of 0.75 mg/dL). Liver Function Tests: Recent Labs  Lab 04/14/22 0845 04/16/22 1152  AST 22 37  ALT 16 29  ALKPHOS 195* 246*  BILITOT 0.8 1.3*  PROT 7.0 8.7*  ALBUMIN 4.1 4.7   Recent Labs  Lab 04/16/22 1152  LIPASE 33   No results for input(s): "AMMONIA" in the last 168 hours. Coagulation Profile: No results for input(s): "INR", "PROTIME" in the last 168 hours. Cardiac Enzymes: No results for input(s): "CKTOTAL", "CKMB", "CKMBINDEX", "TROPONINI" in the last 168 hours. BNP (last 3  results) No results for input(s): "PROBNP" in the last 8760 hours. HbA1C: No results for input(s): "HGBA1C" in the last 72 hours. CBG: No results for input(s): "GLUCAP" in the last 168 hours. Lipid Profile: No results for input(s): "CHOL", "HDL", "LDLCALC", "TRIG", "CHOLHDL", "LDLDIRECT" in the last 72 hours. Thyroid Function Tests: No results for input(s): "TSH", "T4TOTAL", "FREET4", "T3FREE", "THYROIDAB" in the last 72 hours. Anemia Panel: No results for input(s): "VITAMINB12", "FOLATE", "FERRITIN", "TIBC", "IRON", "RETICCTPCT" in the last  72 hours. Urine analysis:    Component Value Date/Time   COLORURINE AMBER (A) 03/22/2022 0241   APPEARANCEUR CLEAR 03/22/2022 0241   LABSPEC 1.015 03/22/2022 0241   PHURINE 6.0 03/22/2022 0241   GLUCOSEU NEGATIVE 03/22/2022 0241   HGBUR SMALL (A) 03/22/2022 0241   BILIRUBINUR MODERATE (A) 03/22/2022 0241   KETONESUR 20 (A) 03/22/2022 0241   PROTEINUR NEGATIVE 03/31/2022 0945   NITRITE NEGATIVE 03/22/2022 0241   LEUKOCYTESUR NEGATIVE 03/22/2022 0241    Radiological Exams on Admission: CT ABDOMEN PELVIS W CONTRAST  Result Date: 04/16/2022 CLINICAL DATA:  Metastatic colorectal carcinoma. Biliary and pancreatic stenting. Stage IV colorectal carcinoma. * Tracking Code: BO * EXAM: CT ABDOMEN AND PELVIS WITH CONTRAST TECHNIQUE: Multidetector CT imaging of the abdomen and pelvis was performed using the standard protocol following bolus administration of intravenous contrast. RADIATION DOSE REDUCTION: This exam was performed according to the departmental dose-optimization program which includes automated exposure control, adjustment of the mA and/or kV according to patient size and/or use of iterative reconstruction technique. CONTRAST:  15mL OMNIPAQUE IOHEXOL 300 MG/ML  SOLN COMPARISON:  04/04/2022 FINDINGS: Lower chest: Lung bases are clear. Hepatobiliary: Indwelling biliary catheters extend from the LEFT and RIGHT extrahepatic ducts through the common  bile duct to the duodenum. Mild biliary duct dilatation in the LEFT hepatic lobe. Mild periportal edema is also present. Small low-density lesion in the RIGHT hepatic lobe (image 60/2) measures 6 mm and not changed. No enhancing lesion is evident. Pancreas: No pancreatic duct dilatation. No pancreatic lesion identified. Spleen: Normal spleen Adrenals/urinary tract: Adrenal glands normal. Large round nonobstructing calculus in the upper LEFT kidney measures 10 mm. Similar calculus in the mid RIGHT kidney. Ureters and bladder normal. Stomach/Bowel: Stomach normal. There is a stent within the third portion duodenum. This likely represent migrated pancreatic duct stent or common bile duct stent. No evidence of bowel obstruction. Small bowel normal. Appendix not identified. Colon rectosigmoid colon normal. Vascular/Lymphatic: Abdominal aorta is normal caliber. No periportal or retroperitoneal adenopathy. No pelvic adenopathy. Reproductive: Uterus and adnexa unremarkable. Other: Peritoneal nodular metastasis again noted. Nodes in the RIGHT ventral peritoneal space/lesser omentum are most prominent. For example 19 mm by 13 mm implant beneath the RIGHT rectus muscle ( image 50/2) compares to 20 mm x 14 mm for no interval change. Similar RIGHT upper quadrant nodule measures 13 mm image 44/2 compared 18 mm. Nodularity along the LEFT pericolic gutter again noted. Example cluster of nodules measures 13 mm image 49/2 compared to 15 mm. Mild nodularity along the RIGHT pericolic gutter to lesser degree. Small amount free fluid in the LEFT pelvis is unchanged. Musculoskeletal: No aggressive osseous lesion. 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 No significant change from CT 04/04/2022. 2. Indwelling biliary catheters with mild biliary duct dilatation in the LEFT hepatic lobe. 3. Stable peritoneal nodular metastasis. 4. Migrated catheter in the third portion duodenum, no change from  prior. Electronically Signed   By: Suzy Bouchard M.D.   On: 04/16/2022 15:22     Assessment/Plan  Intractable nausea and vomiting -Unclear if this is related to chemotherapy versus hypertensive urgency or biliary in origin -Patient continues to remain nauseous despite multiple doses of antiemetics in the ED.  Will place the patient in observation.  Clear liquid diet.  IV fluids. -Will put her on round-the-clock Zofran for now and as needed Reglan -CT imaging as above -Use Protonix IV twice a day.  Hypertensive urgency -Blood pressure extremely  elevated.  IV labetalol being given by ED provider.  Resume home oral regimen.  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  Had FOLFOX on 04/14/2022 as per oncology.  Will follow-up oncology recommendations  History of malignant biliary strictures Elevated LFTs -Status post ERCP and stricture dilatation with pancreatic duct stenting during recent hospitalization. -Slightly elevated alkaline phosphatase and total bilirubin.  CT imaging as above. -ED provider is trying to reach out to [place].  Follow recommendations. -Repeat a.m. LFTs  Hypokalemia -Replace.  Repeat a.m. labs.  Leukocytosis -Mild.  Repeat a.m. labs   DVT prophylaxis: Lovenox Code Status: Full Family Communication: None at bedside Disposition Plan: Home in 1 to 2 days once clinically improved Consults called: Sent secure chat message to oncology/Dr. Benay Spice; ED provider is trying to [place] on-call. Admission status: Observation/telemetry  Severity of Illness: The appropriate patient status for this patient is OBSERVATION. Observation status is judged to be reasonable and necessary in order to provide the required intensity of service to ensure the patient's safety. The patient's presenting symptoms, physical exam findings, and initial radiographic and laboratory data in the context of their medical condition is  felt to place them at decreased risk for further clinical deterioration. Furthermore, it is anticipated that the patient will be medically stable for discharge from the hospital within 2 midnights of admission.     Aline August MD [facility]  04/16/2022, 5:09 PM

## 2022-04-17 DIAGNOSIS — Z79899 Other long term (current) drug therapy: Secondary | ICD-10-CM | POA: Diagnosis not present

## 2022-04-17 DIAGNOSIS — R0789 Other chest pain: Secondary | ICD-10-CM | POA: Diagnosis present

## 2022-04-17 DIAGNOSIS — C19 Malignant neoplasm of rectosigmoid junction: Secondary | ICD-10-CM | POA: Diagnosis present

## 2022-04-17 DIAGNOSIS — Z823 Family history of stroke: Secondary | ICD-10-CM | POA: Diagnosis not present

## 2022-04-17 DIAGNOSIS — C189 Malignant neoplasm of colon, unspecified: Secondary | ICD-10-CM | POA: Diagnosis not present

## 2022-04-17 DIAGNOSIS — D630 Anemia in neoplastic disease: Secondary | ICD-10-CM | POA: Diagnosis present

## 2022-04-17 DIAGNOSIS — Z5982 Transportation insecurity: Secondary | ICD-10-CM | POA: Diagnosis not present

## 2022-04-17 DIAGNOSIS — Z87442 Personal history of urinary calculi: Secondary | ICD-10-CM | POA: Diagnosis not present

## 2022-04-17 DIAGNOSIS — R112 Nausea with vomiting, unspecified: Secondary | ICD-10-CM | POA: Diagnosis not present

## 2022-04-17 DIAGNOSIS — X58XXXA Exposure to other specified factors, initial encounter: Secondary | ICD-10-CM | POA: Diagnosis present

## 2022-04-17 DIAGNOSIS — D6481 Anemia due to antineoplastic chemotherapy: Secondary | ICD-10-CM | POA: Diagnosis present

## 2022-04-17 DIAGNOSIS — Z8249 Family history of ischemic heart disease and other diseases of the circulatory system: Secondary | ICD-10-CM | POA: Diagnosis not present

## 2022-04-17 DIAGNOSIS — R748 Abnormal levels of other serum enzymes: Secondary | ICD-10-CM | POA: Diagnosis present

## 2022-04-17 DIAGNOSIS — I10 Essential (primary) hypertension: Secondary | ICD-10-CM | POA: Diagnosis present

## 2022-04-17 DIAGNOSIS — C786 Secondary malignant neoplasm of retroperitoneum and peritoneum: Secondary | ICD-10-CM | POA: Diagnosis not present

## 2022-04-17 DIAGNOSIS — I160 Hypertensive urgency: Secondary | ICD-10-CM | POA: Diagnosis present

## 2022-04-17 DIAGNOSIS — Z5986 Financial insecurity: Secondary | ICD-10-CM | POA: Diagnosis not present

## 2022-04-17 DIAGNOSIS — T451X5A Adverse effect of antineoplastic and immunosuppressive drugs, initial encounter: Secondary | ICD-10-CM | POA: Diagnosis present

## 2022-04-17 DIAGNOSIS — K831 Obstruction of bile duct: Secondary | ICD-10-CM | POA: Diagnosis not present

## 2022-04-17 DIAGNOSIS — K2100 Gastro-esophageal reflux disease with esophagitis, without bleeding: Secondary | ICD-10-CM

## 2022-04-17 DIAGNOSIS — E876 Hypokalemia: Secondary | ICD-10-CM | POA: Diagnosis present

## 2022-04-17 DIAGNOSIS — Z833 Family history of diabetes mellitus: Secondary | ICD-10-CM | POA: Diagnosis not present

## 2022-04-17 DIAGNOSIS — Z5941 Food insecurity: Secondary | ICD-10-CM | POA: Diagnosis not present

## 2022-04-17 DIAGNOSIS — Z888 Allergy status to other drugs, medicaments and biological substances status: Secondary | ICD-10-CM | POA: Diagnosis not present

## 2022-04-17 DIAGNOSIS — R110 Nausea: Secondary | ICD-10-CM | POA: Diagnosis present

## 2022-04-17 DIAGNOSIS — R7989 Other specified abnormal findings of blood chemistry: Secondary | ICD-10-CM | POA: Diagnosis present

## 2022-04-17 DIAGNOSIS — Z87891 Personal history of nicotine dependence: Secondary | ICD-10-CM | POA: Diagnosis not present

## 2022-04-17 DIAGNOSIS — D72829 Elevated white blood cell count, unspecified: Secondary | ICD-10-CM | POA: Diagnosis present

## 2022-04-17 LAB — CBC
HCT: 33.9 % — ABNORMAL LOW (ref 36.0–46.0)
Hemoglobin: 11.2 g/dL — ABNORMAL LOW (ref 12.0–15.0)
MCH: 31.3 pg (ref 26.0–34.0)
MCHC: 33 g/dL (ref 30.0–36.0)
MCV: 94.7 fL (ref 80.0–100.0)
Platelets: 193 10*3/uL (ref 150–400)
RBC: 3.58 MIL/uL — ABNORMAL LOW (ref 3.87–5.11)
RDW: 13.6 % (ref 11.5–15.5)
WBC: 6.9 10*3/uL (ref 4.0–10.5)
nRBC: 0 % (ref 0.0–0.2)

## 2022-04-17 LAB — COMPREHENSIVE METABOLIC PANEL
ALT: 28 U/L (ref 0–44)
AST: 38 U/L (ref 15–41)
Albumin: 3.5 g/dL (ref 3.5–5.0)
Alkaline Phosphatase: 174 U/L — ABNORMAL HIGH (ref 38–126)
Anion gap: 7 (ref 5–15)
BUN: 7 mg/dL (ref 6–20)
CO2: 27 mmol/L (ref 22–32)
Calcium: 8.7 mg/dL — ABNORMAL LOW (ref 8.9–10.3)
Chloride: 102 mmol/L (ref 98–111)
Creatinine, Ser: 0.57 mg/dL (ref 0.44–1.00)
GFR, Estimated: >60 mL/min (ref >60)
Glucose, Bld: 95 mg/dL (ref 70–99)
Potassium: 3.7 mmol/L (ref 3.5–5.1)
Sodium: 136 mmol/L (ref 135–145)
Total Bilirubin: 1.1 mg/dL (ref 0.3–1.2)
Total Protein: 6.5 g/dL (ref 6.5–8.1)

## 2022-04-17 LAB — MAGNESIUM: Magnesium: 2.1 mg/dL (ref 1.7–2.4)

## 2022-04-17 MED ORDER — HYDROCODONE-ACETAMINOPHEN 10-325 MG PO TABS: 1.0000 | ORAL_TABLET | Freq: Four times a day (QID) | ORAL | Status: DC | PRN

## 2022-04-17 MED ORDER — LABETALOL HCL 5 MG/ML IV SOLN
10.0000 mg | INTRAVENOUS | Status: DC | PRN
  Administered 2022-04-17 – 2022-04-18 (×2): 10 mg via INTRAVENOUS
  Filled 2022-04-17 (×4): qty 4

## 2022-04-17 MED ORDER — ENOXAPARIN SODIUM 40 MG/0.4ML IJ SOSY
40.0000 mg | PREFILLED_SYRINGE | Freq: Every day | INTRAMUSCULAR | Status: DC
  Filled 2022-04-17 (×2): qty 0.4

## 2022-04-17 MED ORDER — HYDROCODONE-ACETAMINOPHEN 5-325 MG PO TABS
1.0000 | ORAL_TABLET | ORAL | Status: DC | PRN
  Administered 2022-04-17 – 2022-04-18 (×3): 2 via ORAL
  Filled 2022-04-17 (×3): qty 2

## 2022-04-17 MED ORDER — CHLORHEXIDINE GLUCONATE CLOTH 2 % EX PADS
6.0000 | MEDICATED_PAD | Freq: Every day | CUTANEOUS | Status: DC
  Administered 2022-04-17 – 2022-04-18 (×2): 6 via TOPICAL

## 2022-04-17 NOTE — Progress Notes (Addendum)
IP PROGRESS NOTE  Subjective:   April [name] is well-known to me with a history of metastatic colon cancer.  She was treated with cycle 1 of salvage FOLFOX chemotherapy 04/14/2022.  She reports developing nausea/vomiting on the evening of 04/14/2022.  She developed abdominal pain, diaphoresis, and chest discomfort yesterday.  She presented to the emergency room for further evaluation. She reports nausea and pain have improved today. Objective: Vital signs in last 24 hours: Blood pressure (!) 150/112, pulse 84, temperature 98.2 F (36.8 C), temperature source Oral, resp. rate 18, height 5\' 4"  (1.626 m), weight 153 lb (69.4 kg), last menstrual period 01/07/2006, SpO2 100 %.  Intake/Output from previous day: 04/03 0701 - 04/04 0700 In: 1147 [I.V.:1147] Out: 1000 [Urine:1000]  Physical Exam:  HEENT: No thrush, mild white coat over the tongue Lungs: Clear bilaterally, no respiratory distress Cardiac: Regular rate and rhythm Abdomen: Soft, no mass, no hepatosplenomegaly Extremities: No leg edema   Portacath/PICC-without erythema  Lab Results: Recent Labs    04/16/22 1152 04/17/22 0500  WBC 11.1* 6.9  HGB 13.6 11.2*  HCT 41.1 33.9*  PLT 278 193    BMET Recent Labs    04/16/22 1152 04/17/22 0500  NA 138 136  K 2.9* 3.7  CL 97* 102  CO2 27 27  GLUCOSE 119* 95  BUN 19 7  CREATININE 0.75 0.57  CALCIUM 9.8 8.7*    Lab Results  Component Value Date   CEA1 <0.6 03/23/2022   CEA 14.87 (H) 04/14/2022   CAN199 213 (H) 03/23/2022    Studies/Results: CT ABDOMEN PELVIS W CONTRAST  Result Date: 04/16/2022 CLINICAL DATA:  Metastatic colorectal carcinoma. Biliary and pancreatic stenting. Stage IV colorectal carcinoma. * Tracking Code: BO * EXAM: CT ABDOMEN AND PELVIS WITH CONTRAST TECHNIQUE: Multidetector CT imaging of the abdomen and pelvis was performed using the standard protocol following bolus administration of intravenous contrast. RADIATION DOSE REDUCTION: This exam was  performed according to the departmental dose-optimization program which includes automated exposure control, adjustment of the mA and/or kV according to patient size and/or use of iterative reconstruction technique. CONTRAST:  131mL OMNIPAQUE IOHEXOL 300 MG/ML  SOLN COMPARISON:  04/04/2022 FINDINGS: Lower chest: Lung bases are clear. Hepatobiliary: Indwelling biliary catheters extend from the LEFT and RIGHT extrahepatic ducts through the common bile duct to the duodenum. Mild biliary duct dilatation in the LEFT hepatic lobe. Mild periportal edema is also present. Small low-density lesion in the RIGHT hepatic lobe (image 60/2) measures 6 mm and not changed. No enhancing lesion is evident. Pancreas: No pancreatic duct dilatation. No pancreatic lesion identified. Spleen: Normal spleen Adrenals/urinary tract: Adrenal glands normal. Large round nonobstructing calculus in the upper LEFT kidney measures 10 mm. Similar calculus in the mid RIGHT kidney. Ureters and bladder normal. Stomach/Bowel: Stomach normal. There is a stent within the third portion duodenum. This likely represent migrated pancreatic duct stent or common bile duct stent. No evidence of bowel obstruction. Small bowel normal. Appendix not identified. Colon rectosigmoid colon normal. Vascular/Lymphatic: Abdominal aorta is normal caliber. No periportal or retroperitoneal adenopathy. No pelvic adenopathy. Reproductive: Uterus and adnexa unremarkable. Other: Peritoneal nodular metastasis again noted. Nodes in the RIGHT ventral peritoneal space/lesser omentum are most prominent. For example 19 mm by 13 mm implant beneath the RIGHT rectus muscle ( image 50/2) compares to 20 mm x 14 mm for no interval change. Similar RIGHT upper quadrant nodule measures 13 mm image 44/2 compared 18 mm. Nodularity along the LEFT pericolic gutter again noted. Example cluster of  nodules measures 13 mm image 49/2 compared to 15 mm. Mild nodularity along the RIGHT pericolic gutter to  lesser degree. Small amount free fluid in the LEFT pelvis is unchanged. Musculoskeletal: No aggressive osseous lesion. 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 No significant change from CT 04/04/2022. 2. Indwelling biliary catheters with mild biliary duct dilatation in the LEFT hepatic lobe. 3. Stable peritoneal nodular metastasis. 4. Migrated catheter in the third portion duodenum, no change from prior. Electronically Signed   By: Suzy Bouchard M.D.   On: 04/16/2022 15:22    Medications: I have reviewed the patient's current medications.  Assessment/Plan: Sigmoid colon cancer, stage IV (pT4a,pN2b,M1c) Colonoscopy 03/24/2019-3 rectal polyps-hyperplastic polyps, distal colon biopsy-at least intramucosal adenocarcinoma, completely obstructing mass in the distal sigmoid colon, could not be traversed, intact mismatch repair protein expression 03/24/2019-CEA 56.1 03/29/2019 CT abdomen/pelvis-circumferential thickening involving the entire mid and distal sigmoid colon to the level of the rectosigmoid junction, solid/cystic mass in the left ovary, bilateral nephrolithiasis Robotic assisted low anterior resection, mesenteric lymphadenectomy, bilateral salpingo-oophorectomy 04/08/2019 Pathology ([facility] review of outside pathology) metastatic adenocarcinoma involving the peritoneum overlying the round ligament, serosa of the urinary bladder, serosa of the right ureter a sacral area, pelvic peritoneum, and left ovary.  Omental biopsy with focal mucin pools with no carcinoma cells identified, right hemidiaphragm biopsy involved by metastatic adenocarcinoma, invasive adenocarcinoma the sigmoid colon, moderately differentiated, T4a, perineural and vascular invasion present, 7/22 lymph nodes, multiple tumor deposits, resection margins negative Negative for PD-L1, low probability of MSI-high, HER-2 negative, negative for BRAF, NRAS and KRAS alterations CTs 05/19/2019-no evidence  of metastatic disease, findings suspicious for colitis of the transverse and ascending colon, small amount of ascites in the cul-de-sac, bilateral renal calculi Cycle 1 FOLFIRI 05/24/2019, bevacizumab added with cycle 2 Cycle 4 FOLFIRI/bevacizumab 07/05/2019 Cycle 5 FOLFIRI 07/27/2019, bevacizumab held secondary to hypertension Cycle 6 FOLFIRI 08/10/1999, bevacizumab held secondary to hypertension CTs 08/30/2019-no evidence of recurrent disease, new subsolid right lower lobe nodule felt to be inflammatory, emphysema Cycle 7 FOLFIRI 09/26/2019, bevacizumab remains on hold secondary to hypertension Cycle 8 FOLFIRI 10/17/2019, bevacizumab held secondary to hypertension, Udenyca added for neutropenia Cycle 9 FOLFIRI 11/07/2019, bevacizumab held secondary to hypertension, Udenyca Cycle 10 FOLFIRI 11/28/2019, bevacizumab held, Udenyca Cycle 11 FOLFIRI 12/26/2019, bevacizumab held, Udenyca CTs 01/25/2020-no evidence of recurrent disease, resolution of right lower lobe nodule Maintenance Xeloda beginning 02/06/2020 CTs 04/25/2020- no evidence of metastatic disease, multiple bilateral renal calculi without hydronephrosis Maintenance Xeloda continued CT abdomen/pelvis without contrast 08/10/2020-bilateral staghorn renal calculi, no evidence of metastatic disease; addendum 08/23/2020-small but increasing omental nodules. Cycle 1 FOLFIRI/panitumumab 09/19/2020 Cycle 2 FOLFIRI/Panitumumab 10/03/2020 Cycle 3 FOLFIRI/Panitumumab 10/17/2020 Cycle 4 FOLFIRI/panitumumab 10/31/2020 Cycle 5 FOLFIRI/Panitumumab 11/14/2020 CTs 11/26/2020-stable omental metastases compared to 10/22/2020, mildly decreased from 08/10/2020.  Left lower quadrant tiny paracolic gutter implant slightly decreased from CT 08/10/2020.  No new or progressive metastatic disease in the abdomen or pelvis. Cycle 6 FOLFIRI/Panitumumab 11/28/2020, irinotecan dose reduced, treatment schedule adjusted to every 3 weeks going forward Cycle 7 FOLFIRI/panitumumab  12/24/2020 Cycle 8 FOLFIRI/Panitumumab 01/16/2021--treatment held due to hypertension in the infusion area. Cycle 8 FOLFIRI/panitumumab 01/21/2021 Cycle 9 FOLFIRI/panitumumab 02/11/2021 Cycle 10 FOLFIRI/Panitumumab 03/04/2021 Cycle 11 FOLFIRI/Panitumumab 03/25/2021 CT abdomen/pelvis 04/10/2021-stable right omental and left posterior paracolic gutter nodules Cycle 12 FOLFIRI/panitumumab 04/15/2021 Cycle 13 FOLFIRI/panitumumab 05/06/2021 Cycle 14 FOLFIRI/Panitumumab 05/27/2021 Cycle 15 FOLFIRI/Panitumumab 06/17/2021 CT abdomen/pelvis 07/05/2021-slight enlargement of a dominant right omental implant, no new implants Patient requested a  treatment break CT abdomen/pelvis 10/04/2021-mild increase in size of multiple omental soft tissue nodules, 4 mm calculus in the distal left ureter adjacent to the ureteral stent Cycle 1 Lonsurf 10/14/2021 10/28/2021 Avastin every 2 weeks Cycle 2 Lonsurf 11/11/2021 Cycle 3 Lonsurf 12/09/2021 Cycle 4 Lonsurf 01/06/2022 Avastin held 01/20/2022 due to proteinuria, 24-hour urine 43 mg CT/pelvis 01/30/2022-possible new peritoneal implant at the transverse colon, no significant change in other omental/peritoneal implants, stable chronic rectal wall thickening, status post removal of double-J left ureteral stent with no evidence of hydronephrosis or ureteral calculus, nonobstructing bilateral renal calculi Cycle 5 Lonsurf 02/05/2022 or 02/06/2022 Avastin every 2 weeks Cycle 6 Lonsurf 03/03/2022 CTs 04/05/2022-increase in size and number of peritoneal implants compared to January 2024 central liver mass narrowing the anterior branch of the right portal vein, decreased biliary duct dilation, biliary stents in place, stable right cardiophrenic lymph nodes, separate pigtail stent in the lumen of the proximal duodenum Cycle 1 FOLFOX 04/14/2022 CT abdomen/pelvis 04/16/2022-stent within the third portion of the duodenum, stable peritoneal nodules,, indwelling biliary stents with mild left biliary duct  dilation,   Hypertension G4 P3, twins Kidney stones-bilateral staghorn renal calculi on CT 08/10/2020 Infected Port-A-Cath 09/12/2019-placed on Augmentin, referred for Port-A-Cath removal; Port-A-Cath removed 09/14/2019; PICC line placed 09/14/2019; culture staph aureus.  Course of Septra completed. Neutropenia secondary to chemotherapy-Udenyca added with cycle 8 FOLFIRI Right nephrostomy tube 09/12/2020; stent placed 10/09/2020, percutaneous nephrostolithotomy treatment of right-sided kidney stones 10/09/2020, malpositioned right ureter stent replacement 10/23/2020, right ureter stent removed 10/29/2020 Percutaneous left nephrostolithotomy 10/01/2021-no renal stone identified, left ureter stent left in place Cystoscopy 10/14/2021-stent removed 8.  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  Admission 04/16/2022 with nausea/vomiting and abdominal pain     April [name] is now at day 4 following cycle 1 FOLFOX chemotherapy given for treatment of progressive colon cancer.  She is admitted with nausea and vomiting I suspect her symptoms are related to toxicity from chemotherapy.  Nausea is improved today.  She has chronic hypertension treated with multiple antihypertensive agents as an outpatient.  Recommendations: Advance diet as tolerated Continue oxycodone as needed for pain Add Decadron if she develops recurrent nausea, given for delayed chemotherapy-induced nausea Discharge to home when she is taking p.o. 5.    Follow-up as scheduled with Cancer center, please call oncology as needed  LOS: 0 days   Betsy Coder, MD   04/17/2022, 1:33 PM

## 2022-04-17 NOTE — Consult Note (Addendum)
Consultation  Referring Provider: [facility] Primary Care Physician:  Orma Render, NP Primary Gastroenterologist:  Dr. Latricia Heft - [facility]; Dr. Rush Landmark - [facility]  Reason for Consultation:   Nausea / Vomiting  HPI: April [name] is a 47 y.o. female with history of sigmoid adenocarcinoma stage IV, initially diagnosed 2021.  She underwent robotic assisted low anterior resection and mesenteric lymphadenectomy and bilateral salpingo-oophorectomy March 2021.  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  She developed signs of increase in size and number of peritoneal implants by most recent CT on 04/05/2022, and chemotherapy regimen has just been changed to FOLFOX with initial dosing on 04/14/2022. She had been seen by Dr. Rush Landmark on 03/22/2022 with elevated LFTs and T. bili of 6.1. MRI/MRCP showed a centrally located mass in the liver causing obstruction of the confluence of the intrahepatic bile ducts and the proximal common hepatic duct with severe intrahepatic biliary ductal dilation.  Imaging findings most compatible with an aggressive neoplasm such as cholangiocarcinoma and or not favored to represent a metastatic lesion, also noted to have progressive peritoneal carcinomatosis. She underwent ERCP on 03/24/2022 with finding of LA Grade C esophagitis, there was severe biliary stricturing in the hepatic duct affecting the left hepatic duct/common hepatic duct and right hepatic duct/common hepatic duct and the bifurcation.  Stricture was malignant appearing, this was dilated, cytology was obtained, a temporary pancreatic duct stent was placed and then an 8.5 x 15 cm plastic stent was placed into the left hepatic duct, and 1 7 French by 15 cm plastic stent was placed into the right hepatic duct.  Plan was for repeat ERCP in 3 to 4 months Cytology did not show any malignant cells..  Patient had decline in LFTs appropriately postprocedure. She was  admitted yesterday with intractable nausea and vomiting, and because of her previous biliary history GI was consulted. CT of the abdomen pelvis yesterday showed the stents to be in position, with mild biliary dilation in the left hepatic lobe, there is a 6 mm lesion in the right hepatic lobe, there is a stent in the third portion of the duodenum, and previously noted peritoneal nodular metastases. Labs show potassium of 2.9/BUN 19/creatinine 0.75 T. bili 1.3/alk phos 246/AST 37/ALT 29 WBC 11.1/hemoglobin 13.6 Lipase 33 Today WBC 6.9/hemoglobin 11.2 T. bili 1.1/alk phos 174.  Patient is feeling better, on IV antiemetics she has not had any further vomiting but remains nauseated and is afraid to eat.,  She has been afebrile, no complaints of new or different abdominal pain over her baseline.  She had been on Compazine at home post chemo on a as needed basis but says it was not working.   Past Medical History:  Diagnosis Date   Benign essential hypertension 10/06/2017   Last Assessment & Plan:  Formatting of this note might be different from the original. Patient's blood pressure noted to be elevated at today's visit 188/98.  Patient will be provided with antihypertensives in the treatment center if needed in order to meet her Avastin parameters.  Patient recommended to follow up with her primary care physician regarding medication management.  Patient is not cur   Cancer    Colon cancer    Hypertension    Renal calculi    bilateral    Past Surgical History:  Procedure Laterality Date   BILIARY BRUSHING  03/24/2022   Procedure: BILIARY BRUSHING;  Surgeon: Rush Landmark Telford Nab., MD;  [facility];  Service: Gastroenterology;;  BILIARY DILATION  03/24/2022   Procedure: BILIARY DILATION;  Surgeon: Mansouraty, Telford Nab., MD;  Location: [place];  Service: Gastroenterology;;   BILIARY STENT PLACEMENT  03/24/2022   Procedure: BILIARY STENT PLACEMENT;  Surgeon: Irving Copas., MD;  Location: [place];  Service: Gastroenterology;;   BIOPSY  03/24/2022   Procedure: BIOPSY;  Surgeon: Irving Copas., MD;  Location: [place];  Service: Gastroenterology;;   CESAREAN SECTION     x2   CYSTOSCOPY WITH RETROGRADE PYELOGRAM, URETEROSCOPY AND STENT PLACEMENT Right 10/22/2020   Procedure: CYSTOSCOPY WITH RETROGRADE PYELOGRAM, URETEROSCOPY AND STENT PLACEMENT;  Surgeon: Robley Fries, MD;  Location: [facility];  Service: Urology;  Laterality: Right;   ERCP N/A 03/24/2022   Procedure: ENDOSCOPIC RETROGRADE CHOLANGIOPANCREATOGRAPHY (ERCP);  Surgeon: Irving Copas., MD;  Location: [place];  Service: Gastroenterology;  Laterality: N/A;   IR RADIOLOGIST EVAL & MGMT  09/16/2019   IR RADIOLOGIST EVAL & MGMT  09/23/2019   IR RADIOLOGIST EVAL & MGMT  09/20/2019   IR RADIOLOGIST EVAL & MGMT  09/30/2019   IR REMOVAL TUN ACCESS W/ PORT W/O FL MOD SED  09/14/2019   IR VENO/EXT/UNI RIGHT  04/11/2022   PANCREATIC STENT PLACEMENT  03/24/2022   Procedure: PANCREATIC STENT PLACEMENT;  Surgeon: Irving Copas., MD;  Location: [place];  Service: Gastroenterology;;   REMOVAL OF STONES  03/24/2022   Procedure: REMOVAL OF STONES;  Surgeon: Irving Copas., MD;  Location: [place];  Service: Gastroenterology;;   Joan Mayans  03/24/2022   Procedure: Joan Mayans;  Surgeon: Irving Copas., MD;  Location: [place];  Service: Gastroenterology;;   URETERAL STENT PLACEMENT      Prior to Admission medications   Medication Sig Start Date End Date Taking? Authorizing Provider  amLODipine (NORVASC) 10 MG tablet Take 1 tablet (10 mg total) by mouth daily. 07/18/21  Yes Early, Coralee Pesa, NP  hydrALAZINE (APRESOLINE) 50 MG tablet Take 1 tablet (50 mg total) by mouth 3 (three) times daily. 03/27/22 06/25/22 Yes Mariel Aloe, MD  HYDROcodone-acetaminophen (NORCO) 10-325 MG tablet Take 1 tablet by mouth every 6 (six) hours as needed (for pain).   Yes  [provider]  metoprolol tartrate (LOPRESSOR) 25 MG tablet Take 1 tablet (25 mg total) by mouth 2 (two) times daily. 04/07/22  Yes Ladell Pier, MD  pantoprazole (PROTONIX) 40 MG tablet Take 1 tablet (40 mg total) by mouth 2 (two) times daily before a meal for 60 days, THEN 1 tablet (40 mg total) daily. Patient taking differently: Starting on 03/27/2022, take 40 mg by mouth two times a day before meals for 60 days. On Day #61, decrease to 40 mg once a day before a meal. 03/27/22 06/25/22 Yes Nettey, Evalee Jefferson, MD  Plecanatide (TRULANCE) 3 MG TABS Take 3 mg by mouth daily as needed (for constipation). 09/30/21  Yes [provider]  Polyethylene Glycol 3350 (MIRALAX PO) Take 17 g by mouth See admin instructions. Mix 17 grams of powder into the suggested amount of water or liquid and drink one to two times a day as needed for constipation   Yes [provider]  potassium chloride 20 MEQ/15ML (10%) SOLN Take 15 mLs (20 mEq total) by mouth 2 (two) times daily. Take 15 ml twice a day for 4 days, then daily Patient taking differently: Take 20 mEq by mouth daily. 01/08/22  Yes Owens Shark, NP  prochlorperazine (COMPAZINE) 10 MG tablet Take 1 tablet (10 mg total) by mouth  every 6 (six) hours as needed for nausea or vomiting. 10/28/21  Yes Owens Shark, NP  TYLENOL 500 MG tablet Take 500-1,000 mg by mouth every 6 (six) hours as needed for mild pain or headache.   Yes [provider]  lactulose (CHRONULAC) 10 GM/15ML solution Take 15 mLs (10 g total) by mouth 2 (two) times daily. Take twice daily until good bowel movement, then decrease to 15 ml once daily Patient not taking: Reported on 04/16/2022 04/04/22   Ladell Pier, MD  Multiple Vitamin (MULTIVITAMIN WITH MINERALS) TABS tablet Take 1 tablet by mouth daily. Patient not taking: Reported on 04/16/2022 03/28/22   Mariel Aloe, MD  oxyCODONE-acetaminophen (PERCOCET/ROXICET) 5-325 MG tablet Take 1 tablet by mouth every  4 (four) hours as needed for severe pain. Patient not taking: Reported on 04/16/2022 04/04/22   Ladell Pier, MD  promethazine (PHENERGAN) 25 MG tablet Take 25 mg by mouth every 6 (six) hours as needed for nausea or vomiting. Patient not taking: Reported on 04/16/2022    [provider]    Current Facility-Administered Medications  Medication Dose Route Frequency Provider Last Rate Last Admin   0.9 % NaCl with KCl 40 mEq / L  infusion   Intravenous Continuous Starla Link, Kshitiz, MD 75 mL/hr at 04/17/22 0841 75 mL/hr at 04/17/22 0841   acetaminophen (TYLENOL) tablet 650 mg  650 mg Oral Q6H PRN Aline August, MD       Or   acetaminophen (TYLENOL) suppository 650 mg  650 mg Rectal Q6H PRN Aline August, MD       alum & mag hydroxide-simeth (MAALOX/MYLANTA) 200-200-20 MG/5ML suspension 30 mL  30 mL Oral Q6H PRN Starla Link, Kshitiz, MD   30 mL at 04/16/22 2134   amLODipine (NORVASC) tablet 10 mg  10 mg Oral Daily Starla Link, Kshitiz, MD   10 mg at 04/17/22 0837   bisacodyl (DULCOLAX) suppository 10 mg  10 mg Rectal Daily PRN Aline August, MD       Chlorhexidine Gluconate Cloth 2 % PADS 6 each  6 each Topical Daily Alekh, Kshitiz, MD       hydrALAZINE (APRESOLINE) tablet 50 mg  50 mg Oral TID Aline August, MD   50 mg at 04/17/22 0836   metoCLOPramide (REGLAN) injection 10 mg  10 mg Intravenous Q6H PRN Aline August, MD       metoprolol tartrate (LOPRESSOR) tablet 25 mg  25 mg Oral BID Starla Link, Kshitiz, MD   25 mg at 04/17/22 0837   morphine (PF) 2 MG/ML injection 2 mg  2 mg Intravenous Q2H PRN Aline August, MD   2 mg at 04/17/22 0849   ondansetron (ZOFRAN) injection 4 mg  4 mg Intravenous Q6H Alekh, Kshitiz, MD   4 mg at 04/17/22 0447   oxyCODONE (Oxy IR/ROXICODONE) immediate release tablet 5 mg  5 mg Oral Q4H PRN Starla Link, Kshitiz, MD       pantoprazole (PROTONIX) injection 40 mg  40 mg Intravenous Q12H Alekh, Kshitiz, MD   40 mg at 04/17/22 0837   polyethylene glycol (MIRALAX / GLYCOLAX) packet 17 g   17 g Oral Daily PRN Aline August, MD       senna-docusate (Senokot-S) tablet 1 tablet  1 tablet Oral BID Aline August, MD   1 tablet at 04/17/22 0836    Allergies as of 04/16/2022 - Review Complete 04/16/2022  Allergen Reaction Noted   Lisinopril Swelling and Other (See Comments) 07/21/2019   Irinotecan Other (See Comments) 10/03/2020  Leucovorin calcium Other (See Comments) 10/03/2020    Family History  Problem Relation Age of Onset   Hypertension Mother    Diabetes Mellitus II Father    Stroke Father    Ulcers Sister     Social History   Socioeconomic History   Marital status: Single    Spouse name: Not on file   Number of children: Not on file   Years of education: Not on file   Highest education level: Not on file  Occupational History   Not on file  Tobacco Use   Smoking status: Former    Types: Cigarettes   Smokeless tobacco: Never   Tobacco comments:    Quit 1 month ago   Vaping Use   Vaping Use: Never used  Substance and Sexual Activity   Alcohol use: Not Currently   Drug use: Not Currently   Sexual activity: Not Currently  Other Topics Concern   Not on file  Social History Narrative   Not on file   Social Determinants of Health   Financial Resource Strain: High Risk (04/16/2021)   Overall Financial Resource Strain (CARDIA)    Difficulty of Paying Living Expenses: Hard  Food Insecurity: Food Insecurity Present (04/16/2022)   Hunger Vital Sign    Worried About [place] in the Last Year: Sometimes true    Ran Out of Food in the Last Year: Sometimes true  Transportation Needs: No Transportation Needs (04/16/2022)   PRAPARE - Hydrologist (Medical): No    Lack of Transportation (Non-Medical): No  Recent Concern: Transportation Needs - Unmet Transportation Needs (03/21/2022)   PRAPARE - Hydrologist (Medical): Yes    Lack of Transportation (Non-Medical): No  Physical Activity: Inactive  (04/16/2021)   Exercise Vital Sign    Days of Exercise per Week: 0 days    Minutes of Exercise per Session: 0 min  Stress: Stress Concern Present (04/16/2021)   [place]    Feeling of Stress : Rather much  Social Connections: Socially Isolated (04/16/2021)   Social Connection and Isolation Panel [NHANES]    Frequency of Communication with Friends and Family: Once a week    Frequency of Social Gatherings with Friends and Family: Once a week    Attends Religious Services: Never    Marine scientist or Organizations: No    Attends Archivist Meetings: Never    Marital Status: Divorced  Human resources officer Violence: Not At Risk (04/16/2022)   Humiliation, Afraid, Rape, and Kick questionnaire    Fear of Current or Ex-Partner: No    Emotionally Abused: No    Physically Abused: No    Sexually Abused: No    Review of Systems: Pertinent positive and negative review of systems were noted in the above HPI section.  All other review of systems was otherwise negative.   Physical Exam: Vital signs in last 24 hours: Temp:  [97.7 F (36.5 C)-99.2 F (37.3 C)] 98.2 F (36.8 C) (04/04 0636) Pulse Rate:  [84-106] 84 (04/04 0636) Resp:  [13-18] 18 (04/04 0636) BP: (150-190)/(103-142) 150/112 (04/04 0636) SpO2:  [98 %-100 %] 100 % (04/04 0636) Weight:  [69.4 kg] 69.4 kg (04/03 1317) Last BM Date : 04/16/22 General:   Alert,  Well-developed, African-American female, pleasant and cooperative in NAD Head:  Normocephalic and atraumatic. Eyes:  Sclera clear, no icterus.   Conjunctiva pink. Ears:  Normal  auditory acuity. Nose:  No deformity, discharge,  or lesions. Mouth:  No deformity or lesions.   Neck:  Supple; no masses or thyromegaly. Lungs:  Clear throughout to auscultation.   No wheezes, crackles, or rhonchi.  Heart:  Regular rate and rhythm; no murmurs, clicks, rubs,  or gallops. Abdomen:  Soft, mild rather diffuse  tenderness, nonfocal BS active,nonpalp mass or hsm.   Rectal: Not done Msk:  Symmetrical without gross deformities. . Pulses:  Normal pulses noted. Extremities:  Without clubbing or edema. Neurologic:  Alert and  oriented x4;  grossly normal neurologically. Skin:  Intact without significant lesions or rashes.. Psych:  Alert and cooperative. Normal mood and affect.  Intake/Output from previous day: 04/03 0701 - 04/04 0700 In: 1147 [I.V.:1147] Out: 1000 [Urine:1000] Intake/Output this shift: No intake/output data recorded.  Lab Results: Recent Labs    04/16/22 1152 04/17/22 0500  WBC 11.1* 6.9  HGB 13.6 11.2*  HCT 41.1 33.9*  PLT 278 193   BMET Recent Labs    04/16/22 1152 04/17/22 0500  NA 138 136  K 2.9* 3.7  CL 97* 102  CO2 27 27  GLUCOSE 119* 95  BUN 19 7  CREATININE 0.75 0.57  CALCIUM 9.8 8.7*   LFT Recent Labs    04/17/22 0500  PROT 6.5  ALBUMIN 3.5  AST 38  ALT 28  ALKPHOS 174*  BILITOT 1.1    IMPRESSION:  #80 47 year old African-American female with stage IV adenocarcinoma of the colon, initial diagnosis [id number], now complicated by peritoneal carcinomatosis, and recent biliary obstruction requiring biliary stents into the right hepatic and left hepatic duct on 03/24/2022.  (Cytology without evidence of malignant cells however severe stricturing clinically felt to malignant)  Patient started a new chemotherapy regimen with FOLFOX on 04/14/2022 and developed intractable nausea and vomiting within 24 hours of chemo requiring admission yesterday.  She feels better today with hydration and IV antiemetics but still nauseated.  She is not having any abdominal pain different than her baseline rather diffuse discomfort, afebrile LFTs much improved over 03/24/2022 with just residual alk phos elevation.  CT of the abdomen pelvis yesterday showed the right and left biliary stents to be in good position, there was a stent noted in the third portion of the duodenum which  is the temporary pancreatic duct stent that had been placed at the time of procedure on 03/24/2022 and was expected to fall out.  She does not have any evidence of biliary obstruction or stent malfunction to account for nausea and vomiting which is very likely secondary to new chemo regimen  #2 hypokalemia secondary to nausea and vomiting improved   PLAN: No indication for GI intervention at this time Start clear liquid diet, with supplements and advance as tolerated Cover with a daily PPI if she has not been on one as an outpatient. Patient asking about when she will need to have the current stents replaced-this should occur in June or July.  I will message Dr. Rush Landmark, patient aware that she will be contacted for outpatient ERCP to be scheduled during that timeframe.  She is aware that if she has any issues in the interim with recurrence of jaundice, acute change in abdominal pain fever etc. then stents may require exchange at an earlier date.  GI will be available if needed.   Amy Esterwood PA-C 04/17/2022, 8:53 AM   Attending Physician Note   I have taken a history, reviewed the chart and examined the patient. I performed a  substantive portion of this encounter, including complete performance of at least one of the key components, in conjunction with the APP. I agree with the APP's note, impression and recommendations with my edits. My additional impressions and recommendations are as follows.   *Nausea, vomiting due to chemotherapy. Clear liquids, advance as tolerated. Antiemetics now and with chemotherapy. No further GI evaluation needed at this time.   *GERD with LA Grade C esophagitis. Pantoprazole 40 mg IV bid now and then 40 mg po qd long term as outpatient. Follow antireflux measures. Elevated HOB > 30 degrees at all times. No further GI evaluation needed at this time.   *Metastatic colon cancer with peritoneal carcinomatosis and recent hilar biliary obstruction with right and  left intrahepatic biliary stents placed on 03/24/2022. LFTs improved and stents in good position on CT AP yesterday. PD stent in 3rd duodenum which should pass on its own. No further GI evaluation needed at this time.   GI signing off, available if needed.   Lucio Edward, MD [facility] See AMION,  GI, for our on call provider

## 2022-04-17 NOTE — Progress Notes (Signed)
PROGRESS NOTE    Adylee Lovern  [id number] DOB: 06-20-1975 DOA: 04/16/2022 PCP: Orma Render, NP   Brief Narrative:  47 y.o. 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  On presentation, labs showed WBC of 11.1, potassium 2.9, alkaline phosphatase 246, bili total of 1.3 with normal AST and ALT. CT of abdomen and pelvis with contrast showed no acute significant change from CT 04/04/2022; indwelling biliary catheters with mild biliary duct dilatation in the left hepatic lobe; migrated catheter and third portion duodenum, no change from prior. Patient was given IV fluids, antiemetics and potassium replacement.  GI was consulted.  Assessment & Plan:   Intractable nausea and vomiting -Unclear if this is related to chemotherapy versus hypertensive urgency or biliary in origin -Continue IV fluids.  Still nauseous.  Wants to advance diet to soft. -Continue round-the-clock Zofran for now and as needed Reglan -CT imaging as above -Use Protonix IV twice a day. -Awaiting GI evaluation.   Hypertensive urgency -Blood pressure still extremely elevated.  Use IV labetalol as needed.  Continue home oral regimen.   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  Had FOLFOX on 04/14/2022 as per oncology.  Will follow-up oncology recommendations   History of malignant biliary strictures Elevated  LFTs -Status post ERCP and stricture dilatation with pancreatic duct stenting during recent hospitalization. -Still slightly elevated alkaline phosphatase; total bilirubin has normalized today.  CT imaging as above. -Await GI recommendations. -Repeat a.m. LFTs   Hypokalemia -Improved.   Leukocytosis -Resolved  Anemia of chronic disease -From cancer and chemotherapy.  Hemoglobin stable   DVT prophylaxis: Lovenox Code Status: Full Family Communication: None at bedside Disposition Plan: Status is: Observation The patient will require care spanning > 2 midnights and should be moved to inpatient because: Of severity of illness.  Still nauseous   Consultants: GI/oncology  Procedures: None  Antimicrobials: None   Subjective: Patient seen and examined at bedside.  Still complains of intermittent nausea.  No vomiting since admission.  Denies worsening abdominal pain.  No fever reported. Objective: Vitals:   04/16/22 2133 04/16/22 2234 04/17/22 0205 04/17/22 0636  BP: (!) 157/116 (!) 151/103 (!) 154/114 (!) 150/112  Pulse: 97 96 84 84  Resp: 16 18 18 18   Temp: 98.7 F (37.1 C) 99.2 F (37.3 C) 98.3 F (36.8 C) 98.2 F (36.8 C)  TempSrc: Oral Oral Oral Oral  SpO2: 98% 100% 100% 100%  Weight:      Height:        Intake/Output Summary (Last 24 hours) at 04/17/2022 1015 Last data filed at 04/17/2022 1001 Gross per 24 hour  Intake 1146.95 ml  Output 1000 ml  Net 146.95 ml   Filed Weights   04/16/22 1317  Weight: 69.4 kg    Examination:  General exam: Appears calm and comfortable.  On room air.  Looks chronically ill and deconditioned. Respiratory system: Bilateral decreased breath sounds at bases, no wheezing Cardiovascular system: S1 & S2 heard, Rate controlled Gastrointestinal system: Abdomen is nondistended, soft and mildly tender.  Normal bowel sounds heard. Extremities: No cyanosis, clubbing, edema  Central nervous system: Alert and oriented.  Slow to respond. No  focal neurological deficits. Moving extremities Skin: No rashes, lesions or ulcers Psychiatry: Mostly flat affect.  Not agitated.   Data Reviewed: I have personally reviewed following labs and imaging studies  CBC: Recent Labs  Lab 04/14/22 0845 04/16/22 1152 04/17/22 0500  WBC 7.0 11.1* 6.9  NEUTROABS 3.7 9.7*  --   HGB 11.4* 13.6 11.2*  HCT 34.6* 41.1 33.9*  MCV 95.1 93.6 94.7  PLT 265 278 0000000   Basic Metabolic Panel: Recent Labs  Lab 04/14/22 0845 04/16/22 1152 04/17/22 0500  NA 141 138 136  K 3.3* 2.9* 3.7  CL 106 97* 102  CO2 27 27 27   GLUCOSE 95 119* 95  BUN 16 19 7   CREATININE 0.63 0.75 0.57  CALCIUM 9.3 9.8 8.7*  MG  --   --  2.1   GFR: Estimated Creatinine Clearance: 82.3 mL/min (by C-G formula based on SCr of 0.57 mg/dL). Liver Function Tests: Recent Labs  Lab 04/14/22 0845 04/16/22 1152 04/17/22 0500  AST 22 37 38  ALT 16 29 28   ALKPHOS 195* 246* 174*  BILITOT 0.8 1.3* 1.1  PROT 7.0 8.7* 6.5  ALBUMIN 4.1 4.7 3.5   Recent Labs  Lab 04/16/22 1152  LIPASE 33   No results for input(s): "AMMONIA" in the last 168 hours. Coagulation Profile: No results for input(s): "INR", "PROTIME" in the last 168 hours. Cardiac Enzymes: No results for input(s): "CKTOTAL", "CKMB", "CKMBINDEX", "TROPONINI" in the last 168 hours. BNP (last 3 results) No results for input(s): "PROBNP" in the last 8760 hours. HbA1C: No results for input(s): "HGBA1C" in the last 72 hours. CBG: No results for input(s): "GLUCAP" in the last 168 hours. Lipid Profile: No results for input(s): "CHOL", "HDL", "LDLCALC", "TRIG", "CHOLHDL", "LDLDIRECT" in the last 72 hours. Thyroid Function Tests: No results for input(s): "TSH", "T4TOTAL", "FREET4", "T3FREE", "THYROIDAB" in the last 72 hours. Anemia Panel: No results for input(s): "VITAMINB12", "FOLATE", "FERRITIN", "TIBC", "IRON", "RETICCTPCT" in the last 72 hours. Sepsis Labs: No results for input(s): "PROCALCITON", "LATICACIDVEN"  in the last 168 hours.  No results found for this or any previous visit (from the past 240 hour(s)).       Radiology Studies: CT ABDOMEN PELVIS W CONTRAST  Result Date: 04/16/2022 CLINICAL DATA:  Metastatic colorectal carcinoma. Biliary and pancreatic stenting. Stage IV colorectal carcinoma. * Tracking Code: BO * EXAM: CT ABDOMEN AND PELVIS WITH CONTRAST TECHNIQUE: Multidetector CT imaging of the abdomen and pelvis was performed using the standard protocol following bolus administration of intravenous contrast. RADIATION DOSE REDUCTION: This exam was performed according to the departmental dose-optimization program which includes automated exposure control, adjustment of the mA and/or kV according to patient size and/or use of iterative reconstruction technique. CONTRAST:  150mL OMNIPAQUE IOHEXOL 300 MG/ML  SOLN COMPARISON:  04/04/2022 FINDINGS: Lower chest: Lung bases are clear. Hepatobiliary: Indwelling biliary catheters extend from the LEFT and RIGHT extrahepatic ducts through the common bile duct to the duodenum. Mild biliary duct dilatation in the LEFT hepatic lobe. Mild periportal edema is also present. Small low-density lesion in the RIGHT hepatic lobe (image 60/2) measures 6 mm and not changed. No enhancing lesion is evident. Pancreas: No pancreatic duct dilatation. No pancreatic lesion identified. Spleen: Normal spleen Adrenals/urinary tract: Adrenal glands normal. Large round nonobstructing calculus in the upper LEFT kidney measures 10 mm. Similar calculus in the mid RIGHT kidney. Ureters and bladder normal. Stomach/Bowel: Stomach normal. There is a stent within the  third portion duodenum. This likely represent migrated pancreatic duct stent or common bile duct stent. No evidence of bowel obstruction. Small bowel normal. Appendix not identified. Colon rectosigmoid colon normal. Vascular/Lymphatic: Abdominal aorta is normal caliber. No periportal or retroperitoneal adenopathy. No pelvic  adenopathy. Reproductive: Uterus and adnexa unremarkable. Other: Peritoneal nodular metastasis again noted. Nodes in the RIGHT ventral peritoneal space/lesser omentum are most prominent. For example 19 mm by 13 mm implant beneath the RIGHT rectus muscle ( image 50/2) compares to 20 mm x 14 mm for no interval change. Similar RIGHT upper quadrant nodule measures 13 mm image 44/2 compared 18 mm. Nodularity along the LEFT pericolic gutter again noted. Example cluster of nodules measures 13 mm image 49/2 compared to 15 mm. Mild nodularity along the RIGHT pericolic gutter to lesser degree. Small amount free fluid in the LEFT pelvis is unchanged. Musculoskeletal: No aggressive osseous lesion. 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 No significant change from CT 04/04/2022. 2. Indwelling biliary catheters with mild biliary duct dilatation in the LEFT hepatic lobe. 3. Stable peritoneal nodular metastasis. 4. Migrated catheter in the third portion duodenum, no change from prior. Electronically Signed   By: Suzy Bouchard M.D.   On: 04/16/2022 15:22        Scheduled Meds:  amLODipine  10 mg Oral Daily   Chlorhexidine Gluconate Cloth  6 each Topical Daily   hydrALAZINE  50 mg Oral TID   metoprolol tartrate  25 mg Oral BID   ondansetron (ZOFRAN) IV  4 mg Intravenous Q6H   pantoprazole (PROTONIX) IV  40 mg Intravenous Q12H   senna-docusate  1 tablet Oral BID   Continuous Infusions:  0.9 % NaCl with KCl 40 mEq / L 75 mL/hr (04/17/22 0841)          Aline August, MD [facility] 04/17/2022, 10:15 AM

## 2022-04-18 DIAGNOSIS — R112 Nausea with vomiting, unspecified: Secondary | ICD-10-CM | POA: Diagnosis not present

## 2022-04-18 LAB — MAGNESIUM: Magnesium: 2 mg/dL (ref 1.7–2.4)

## 2022-04-18 LAB — COMPREHENSIVE METABOLIC PANEL
ALT: 24 U/L (ref 0–44)
AST: 27 U/L (ref 15–41)
Albumin: 3.6 g/dL (ref 3.5–5.0)
Alkaline Phosphatase: 163 U/L — ABNORMAL HIGH (ref 38–126)
Anion gap: 6 (ref 5–15)
BUN: 7 mg/dL (ref 6–20)
CO2: 27 mmol/L (ref 22–32)
Calcium: 8.7 mg/dL — ABNORMAL LOW (ref 8.9–10.3)
Chloride: 102 mmol/L (ref 98–111)
Creatinine, Ser: 0.59 mg/dL (ref 0.44–1.00)
GFR, Estimated: >60 mL/min (ref >60)
Glucose, Bld: 94 mg/dL (ref 70–99)
Potassium: 3.9 mmol/L (ref 3.5–5.1)
Sodium: 135 mmol/L (ref 135–145)
Total Bilirubin: 1.3 mg/dL — ABNORMAL HIGH (ref 0.3–1.2)
Total Protein: 6.7 g/dL (ref 6.5–8.1)

## 2022-04-18 MED ORDER — PANTOPRAZOLE SODIUM 40 MG PO TBEC: DELAYED_RELEASE_TABLET | ORAL | Status: DC

## 2022-04-18 MED ORDER — SENNOSIDES-DOCUSATE SODIUM 8.6-50 MG PO TABS: 1.0000 | ORAL_TABLET | Freq: Two times a day (BID) | ORAL | 0 refills | Status: DC

## 2022-04-18 MED ORDER — HEPARIN SOD (PORK) LOCK FLUSH 100 UNIT/ML IV SOLN
500.0000 [IU] | Freq: Once | INTRAVENOUS | Status: AC
  Administered 2022-04-18: 500 [IU] via INTRAVENOUS
  Filled 2022-04-18: qty 5

## 2022-04-18 MED ORDER — ONDANSETRON HCL 4 MG PO TABS: 4.0000 mg | ORAL_TABLET | Freq: Three times a day (TID) | ORAL | 0 refills | Status: DC | PRN

## 2022-04-18 MED ORDER — POTASSIUM CHLORIDE 20 MEQ/15ML (10%) PO SOLN
20.0000 meq | Freq: Every day | ORAL | Status: DC
Start: 2022-04-18 — End: 2022-04-18

## 2022-04-18 MED ORDER — HYDROCODONE-ACETAMINOPHEN 5-325 MG PO TABS: 1.0000 | ORAL_TABLET | Freq: Four times a day (QID) | ORAL | 0 refills | Status: DC | PRN

## 2022-04-18 MED ORDER — METOPROLOL TARTRATE 50 MG PO TABS
50.0000 mg | ORAL_TABLET | Freq: Two times a day (BID) | ORAL | Status: DC
  Administered 2022-04-18: 50 mg via ORAL
  Filled 2022-04-18: qty 1

## 2022-04-18 MED ORDER — METOPROLOL TARTRATE 25 MG PO TABS
50.0000 mg | ORAL_TABLET | Freq: Two times a day (BID) | ORAL | 0 refills | Status: DC
Start: 2022-04-18 — End: 2022-04-25

## 2022-04-18 MED ORDER — ALUM & MAG HYDROXIDE-SIMETH 200-200-20 MG/5ML PO SUSP: 30.0000 mL | Freq: Four times a day (QID) | ORAL | 1 refills | Status: DC | PRN

## 2022-04-18 MED ORDER — HYDRALAZINE HCL 50 MG PO TABS
75.0000 mg | ORAL_TABLET | Freq: Three times a day (TID) | ORAL | Status: DC
  Administered 2022-04-18: 75 mg via ORAL
  Filled 2022-04-18: qty 1

## 2022-04-18 NOTE — Progress Notes (Signed)
IP PROGRESS NOTE  Subjective:   April [name] reports the nausea has resolved.  Her abdominal pain is at baseline.  No diarrhea.  She is tolerating liquids. Objective: Vital signs in last 24 hours: Blood pressure (!) 148/115, pulse 97, temperature 98.9 F (37.2 C), temperature source Oral, resp. rate 16, height 5\' 4"  (1.626 m), weight 153 lb (69.4 kg), last menstrual period 01/07/2006, SpO2 100 %.  Intake/Output from previous day: 04/04 0701 - 04/05 0700 In: 1930.4 [P.O.:120; I.V.:1810.4] Out: -   Physical Exam:  HEENT: No thrush Abdomen: Soft, no mass, no hepatosplenomegaly, mild tenderness in the right upper abdomen Extremities: No leg edema   Portacath/PICC-without erythema  Lab Results: Recent Labs    04/16/22 1152 04/17/22 0500  WBC 11.1* 6.9  HGB 13.6 11.2*  HCT 41.1 33.9*  PLT 278 193    BMET Recent Labs    04/17/22 0500 04/18/22 0500  NA 136 135  K 3.7 3.9  CL 102 102  CO2 27 27  GLUCOSE 95 94  BUN 7 7  CREATININE 0.57 0.59  CALCIUM 8.7* 8.7*    Lab Results  Component Value Date   CEA1 <0.6 03/23/2022   CEA 14.87 (H) 04/14/2022   CAN199 213 (H) 03/23/2022    Studies/Results: CT ABDOMEN PELVIS W CONTRAST  Result Date: 04/16/2022 CLINICAL DATA:  Metastatic colorectal carcinoma. Biliary and pancreatic stenting. Stage IV colorectal carcinoma. * Tracking Code: BO * EXAM: CT ABDOMEN AND PELVIS WITH CONTRAST TECHNIQUE: Multidetector CT imaging of the abdomen and pelvis was performed using the standard protocol following bolus administration of intravenous contrast. RADIATION DOSE REDUCTION: This exam was performed according to the departmental dose-optimization program which includes automated exposure control, adjustment of the mA and/or kV according to patient size and/or use of iterative reconstruction technique. CONTRAST:  OMNIPAQUE IOHEXOL 300 MG/ML  SOLN COMPARISON:  04/04/2022 FINDINGS: Lower chest: Lung bases are clear. Hepatobiliary:  Indwelling biliary catheters extend from the LEFT and RIGHT extrahepatic ducts through the common bile duct to the duodenum. Mild biliary duct dilatation in the LEFT hepatic lobe. Mild periportal edema is also present. Small low-density lesion in the RIGHT hepatic lobe (image 60/2) measures 6 mm and not changed. No enhancing lesion is evident. Pancreas: No pancreatic duct dilatation. No pancreatic lesion identified. Spleen: Normal spleen Adrenals/urinary tract: Adrenal glands normal. Large round nonobstructing calculus in the upper LEFT kidney measures 10 mm. Similar calculus in the mid RIGHT kidney. Ureters and bladder normal. Stomach/Bowel: Stomach normal. There is a stent within the third portion duodenum. This likely represent migrated pancreatic duct stent or common bile duct stent. No evidence of bowel obstruction. Small bowel normal. Appendix not identified. Colon rectosigmoid colon normal. Vascular/Lymphatic: Abdominal aorta is normal caliber. No periportal or retroperitoneal adenopathy. No pelvic adenopathy. Reproductive: Uterus and adnexa unremarkable. Other: Peritoneal nodular metastasis again noted. Nodes in the RIGHT ventral peritoneal space/lesser omentum are most prominent. For example 19 mm by 13 mm implant beneath the RIGHT rectus muscle ( image 50/2) compares to 20 mm x 14 mm for no interval change. Similar RIGHT upper quadrant nodule measures 13 mm image 44/2 compared 18 mm. Nodularity along the LEFT pericolic gutter again noted. Example cluster of nodules measures 13 mm image 49/2 compared to 15 mm. Mild nodularity along the RIGHT pericolic gutter to lesser degree. Small amount free fluid in the LEFT pelvis is unchanged. Musculoskeletal: No aggressive osseous lesion. 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  No significant change from CT 04/04/2022. 2. Indwelling biliary catheters with mild biliary duct dilatation in the LEFT hepatic lobe. 3. Stable  peritoneal nodular metastasis. 4. Migrated catheter in the third portion duodenum, no change from prior. Electronically Signed   By: Genevive BiStewart  Edmunds M.D.   On: 04/16/2022 15:22    Medications: I have reviewed the patient's current medications.  Assessment/Plan: Sigmoid colon cancer, stage IV (pT4a,pN2b,M1c) Colonoscopy 03/24/2019-3 rectal polyps-hyperplastic polyps, distal colon biopsy-at least intramucosal adenocarcinoma, completely obstructing mass in the distal sigmoid colon, could not be traversed, intact mismatch repair protein expression 03/24/2019-CEA 56.1 03/29/2019 CT abdomen/pelvis-circumferential thickening involving the entire mid and distal sigmoid colon to the level of the rectosigmoid junction, solid/cystic mass in the left ovary, bilateral nephrolithiasis Robotic assisted low anterior resection, mesenteric lymphadenectomy, bilateral salpingo-oophorectomy 04/08/2019 Pathology ([facility] review of outside pathology) metastatic adenocarcinoma involving the peritoneum overlying the round ligament, serosa of the urinary bladder, serosa of the right ureter a sacral area, pelvic peritoneum, and left ovary.  Omental biopsy with focal mucin pools with no carcinoma cells identified, right hemidiaphragm biopsy involved by metastatic adenocarcinoma, invasive adenocarcinoma the sigmoid colon, moderately differentiated, T4a, perineural and vascular invasion present, 7/22 lymph nodes, multiple tumor deposits, resection margins negative Negative for PD-L1, low probability of MSI-high, HER-2 negative, negative for BRAF, NRAS and KRAS alterations CTs 05/19/2019-no evidence of metastatic disease, findings suspicious for colitis of the transverse and ascending colon, small amount of ascites in the cul-de-sac, bilateral renal calculi Cycle 1 FOLFIRI 05/24/2019, bevacizumab added with cycle 2 Cycle 4 FOLFIRI/bevacizumab 07/05/2019 Cycle 5 FOLFIRI 07/27/2019, bevacizumab held secondary to hypertension Cycle 6 FOLFIRI  08/10/1999, bevacizumab held secondary to hypertension CTs 08/30/2019-no evidence of recurrent disease, new subsolid right lower lobe nodule felt to be inflammatory, emphysema Cycle 7 FOLFIRI 09/26/2019, bevacizumab remains on hold secondary to hypertension Cycle 8 FOLFIRI 10/17/2019, bevacizumab held secondary to hypertension, Udenyca added for neutropenia Cycle 9 FOLFIRI 11/07/2019, bevacizumab held secondary to hypertension, Udenyca Cycle 10 FOLFIRI 11/28/2019, bevacizumab held, Udenyca Cycle 11 FOLFIRI 12/26/2019, bevacizumab held, Udenyca CTs 01/25/2020-no evidence of recurrent disease, resolution of right lower lobe nodule Maintenance Xeloda beginning 02/06/2020 CTs 04/25/2020- no evidence of metastatic disease, multiple bilateral renal calculi without hydronephrosis Maintenance Xeloda continued CT abdomen/pelvis without contrast 08/10/2020-bilateral staghorn renal calculi, no evidence of metastatic disease; addendum 08/23/2020-small but increasing omental nodules. Cycle 1 FOLFIRI/panitumumab 09/19/2020 Cycle 2 FOLFIRI/Panitumumab 10/03/2020 Cycle 3 FOLFIRI/Panitumumab 10/17/2020 Cycle 4 FOLFIRI/panitumumab 10/31/2020 Cycle 5 FOLFIRI/Panitumumab 11/14/2020 CTs 11/26/2020-stable omental metastases compared to 10/22/2020, mildly decreased from 08/10/2020.  Left lower quadrant tiny paracolic gutter implant slightly decreased from CT 08/10/2020.  No new or progressive metastatic disease in the abdomen or pelvis. Cycle 6 FOLFIRI/Panitumumab 11/28/2020, irinotecan dose reduced, treatment schedule adjusted to every 3 weeks going forward Cycle 7 FOLFIRI/panitumumab 12/24/2020 Cycle 8 FOLFIRI/Panitumumab 01/16/2021--treatment held due to hypertension in the infusion area. Cycle 8 FOLFIRI/panitumumab 01/21/2021 Cycle 9 FOLFIRI/panitumumab 02/11/2021 Cycle 10 FOLFIRI/Panitumumab 03/04/2021 Cycle 11 FOLFIRI/Panitumumab 03/25/2021 CT abdomen/pelvis 04/10/2021-stable right omental and left posterior paracolic gutter  nodules Cycle 12 FOLFIRI/panitumumab 04/15/2021 Cycle 13 FOLFIRI/panitumumab 05/06/2021 Cycle 14 FOLFIRI/Panitumumab 05/27/2021 Cycle 15 FOLFIRI/Panitumumab 06/17/2021 CT abdomen/pelvis 07/05/2021-slight enlargement of a dominant right omental implant, no new implants Patient requested a treatment break CT abdomen/pelvis 10/04/2021-mild increase in size of multiple omental soft tissue nodules, 4 mm calculus in the distal left ureter adjacent to the ureteral stent Cycle 1 Lonsurf 10/14/2021 10/28/2021 Avastin every 2 weeks Cycle 2 Lonsurf 11/11/2021 Cycle 3 Lonsurf 12/09/2021 Cycle 4 Lonsurf 01/06/2022 Avastin held  01/20/2022 due to proteinuria, 24-hour urine 43 mg CT/pelvis 01/30/2022-possible new peritoneal implant at the transverse colon, no significant change in other omental/peritoneal implants, stable chronic rectal wall thickening, status post removal of double-J left ureteral stent with no evidence of hydronephrosis or ureteral calculus, nonobstructing bilateral renal calculi Cycle 5 Lonsurf 02/05/2022 or 02/06/2022 Avastin every 2 weeks Cycle 6 Lonsurf 03/03/2022 CTs 04/05/2022-increase in size and number of peritoneal implants compared to January 2024 central liver mass narrowing the anterior branch of the right portal vein, decreased biliary duct dilation, biliary stents in place, stable right cardiophrenic lymph nodes, separate pigtail stent in the lumen of the proximal duodenum Cycle 1 FOLFOX 04/14/2022 CT abdomen/pelvis 04/16/2022-stent within the third portion of the duodenum, stable peritoneal nodules,, indwelling biliary stents with mild left biliary duct dilation,   Hypertension G4 P3, twins Kidney stones-bilateral staghorn renal calculi on CT 08/10/2020 Infected Port-A-Cath 09/12/2019-placed on Augmentin, referred for Port-A-Cath removal; Port-A-Cath removed 09/14/2019; PICC line placed 09/14/2019; culture staph aureus.  Course of Septra completed. Neutropenia secondary to chemotherapy-Udenyca  added with cycle 8 FOLFIRI Right nephrostomy tube 09/12/2020; stent placed 10/09/2020, percutaneous nephrostolithotomy treatment of right-sided kidney stones 10/09/2020, malpositioned right ureter stent replacement 10/23/2020, right ureter stent removed 10/29/2020 Percutaneous left nephrostolithotomy 10/01/2021-no renal stone identified, left ureter stent left in place Cystoscopy 10/14/2021-stent removed 8.  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  Admission 04/16/2022 with nausea/vomiting and abdominal pain     April [name] is now at day 5 following cycle 1 FOLFOX chemotherapy given for treatment of progressive colon cancer.  She was admitted with nausea and vomiting.  Her symptoms were likely related to acute toxicity from oxaliplatin.  Nausea has resolved.  She feels metoclopramide help with the nausea.  She has chronic hypertension treated with multiple antihypertensive agents as an outpatient.  Recommendations: Advance diet as tolerated Hydrocodone as needed for pain Discharge to home if tolerating a diet 5.   Follow-up as scheduled with Cancer center, please call oncology as needed  LOS: 1 day   Thornton Papas, MD   04/18/2022, 8:21 AM

## 2022-04-18 NOTE — Discharge Summary (Signed)
Physician Discharge Summary  April [name] ZOX:[id number] DOB: [birth year] DOA: 04/16/2022  PCP: Tollie Eth, NP  Admit date: 04/16/2022 Discharge date: 04/18/2022  Admitted From: Home Disposition: Home  Recommendations for Outpatient Follow-up:  Follow up with PCP in 1 week with repeat CBC/CMP Outpatient follow-up with oncology and GI Follow up in ED if symptoms worsen or new appear   Home Health: No Equipment/Devices: None  Discharge Condition: Stable CODE STATUS: Full Diet recommendation: Heart healthy/soft diet  Brief/Interim Summary: 47 y.o. 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  On presentation, labs showed WBC of 11.1, potassium 2.9, alkaline phosphatase 246, bili total of 1.3 with normal AST and ALT. CT of abdomen and pelvis with contrast showed no acute significant change from CT 04/04/2022; indwelling biliary catheters with mild biliary duct dilatation in the left hepatic lobe; migrated catheter and third portion duodenum, no change from prior. Patient was given IV fluids, antiemetics and potassium replacement.  GI was consulted.  During the hospitalization, her condition has gradually improved.  She is tolerating advanced diet.  GI recommended conservative management and outpatient follow-up with GI.  Oncology recommended outpatient follow-up.  She feels better enough to go home today.  Discharge patient home today with  outpatient follow-up with PCP, oncology and GI.  Discharge Diagnoses:   Intractable nausea and vomiting -Possibly chemotherapy induced -Treated with IV fluids and antiemetics -CT imaging as above - During the hospitalization, her condition has gradually improved.  She is tolerating advanced diet.  GI recommended conservative management and outpatient follow-up with GI.  -She feels better enough to go home today.  Discharge patient home today with outpatient follow-up with PCP, oncology and GI.   Hypertensive urgency -Blood pressure still extremely elevated.  Continue amlodipine and hydralazine on discharge.  Increase metoprolol to 50 mg twice a day.  Outpatient follow-up with PCP.    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  Had FOLFOX on 04/14/2022 as per oncology.  Outpatient follow-up with oncology.  History of malignant biliary strictures Elevated LFTs -Status post ERCP and stricture dilatation with pancreatic duct stenting during recent hospitalization. -LFTs improving; total bili only slightly elevated.   CT imaging as above. -GI recommended conservative management and outpatient follow-up with GI.  Outpatient follow-up of LFTs.   Hypokalemia -Improved.   Leukocytosis -Resolved   Anemia of chronic disease -From cancer and chemotherapy.  Hemoglobin stable  Discharge Instructions  Discharge Instructions     Diet - low sodium heart healthy   Complete by: As directed    Increase activity slowly   Complete by: As directed       Allergies as of 04/18/2022       Reactions   Lisinopril Swelling, Other (See Comments)   Lip swelling and Angioedema   Irinotecan Other (See Comments)   Stomach cramping Patient given 0.5 mL atropine IV and was able to continue infusion.  See Progress note on 10/03/20.    Leucovorin Calcium Other (See Comments)   Stomach cramping Patient given 0.5 mL atropine IV and was able to continue  infusion. See Progress  note on 10/03/20.         Medication List     STOP taking these medications    HYDROcodone-acetaminophen 10-325 MG tablet Commonly known as: NORCO Replaced by: HYDROcodone-acetaminophen 5-325 MG tablet   oxyCODONE-acetaminophen 5-325 MG tablet Commonly known as: PERCOCET/ROXICET   promethazine 25 MG tablet Commonly known as: PHENERGAN       TAKE these medications    alum & mag hydroxide-simeth 200-200-20 MG/5ML suspension Commonly known as: MAALOX/MYLANTA Take 30 mLs by mouth every 6 (six) hours as needed for indigestion or heartburn.   amLODipine 10 MG tablet Commonly known as: NORVASC Take 1 tablet (10 mg total) by mouth daily.   hydrALAZINE 50 MG tablet Commonly known as: APRESOLINE Take 1 tablet (50 mg total) by mouth 3 (three) times daily.   HYDROcodone-acetaminophen 5-325 MG tablet Commonly known as: NORCO/VICODIN Take 1 tablet by mouth every 6 (six) hours as needed for moderate pain. Replaces: HYDROcodone-acetaminophen 10-325 MG tablet   lactulose 10 GM/15ML solution Commonly known as: CHRONULAC Take 15 mLs (10 g total) by mouth 2 (two) times daily. Take twice daily until good bowel movement, then decrease to 15 ml once daily   metoprolol tartrate 25 MG tablet Commonly known as: LOPRESSOR Take 2 tablets (50 mg total) by mouth 2 (two) times daily. What changed: how much to take   MIRALAX PO Take 17 g by mouth See admin instructions. Mix 17 grams of powder into the suggested amount of water or liquid and drink one to two times a day as needed for constipation   multivitamin with minerals Tabs tablet Take 1 tablet by mouth daily.   ondansetron 4 MG tablet Commonly known as: Zofran Take 1 tablet (4 mg total) by mouth every 8 (eight) hours as needed for nausea or vomiting.   pantoprazole 40 MG tablet Commonly known as: PROTONIX Starting on 03/27/2022, take 40 mg by mouth two times a day before meals for 60 days. On Day #61,  decrease to 40 mg once a day before a meal.   potassium chloride 20 MEQ/15ML (10%) Soln Take 15 mLs (20 mEq total) by mouth daily.   prochlorperazine 10 MG tablet Commonly known as: COMPAZINE Take 1 tablet (10 mg total) by mouth every 6 (six) hours as needed for nausea or vomiting.   senna-docusate 8.6-50 MG tablet Commonly known as: Senokot-S Take 1 tablet by mouth 2 (two) times daily.   Trulance 3 MG Tabs Generic drug: Plecanatide Take 3 mg by mouth daily as needed (for constipation).   TYLENOL 500 MG tablet Generic drug: acetaminophen Take 500-1,000 mg by mouth every 6 (six) hours as needed for mild pain or headache.        Follow-up Information     Early, Sung Amabile, NP. Schedule an appointment as soon as possible for a visit in 1 week(s).   Specialty: Nurse Practitioner Contact information: [address] [place] [address] [phone]                Allergies  Allergen Reactions   Lisinopril Swelling and Other (See Comments)    Lip swelling and Angioedema   Irinotecan Other (See Comments)    Stomach cramping Patient given 0.5 mL atropine IV and was able to continue infusion.  See Progress note on 10/03/20.    Leucovorin Calcium Other (See Comments)    Stomach cramping Patient given 0.5 mL atropine IV and was able to continue infusion. See Progress note on 10/03/20.  Consultations: GI/oncology   Procedures/Studies: CT ABDOMEN PELVIS W CONTRAST  Result Date: 04/16/2022 CLINICAL DATA:  Metastatic colorectal carcinoma. Biliary and pancreatic stenting. Stage IV colorectal carcinoma. * Tracking Code: BO * EXAM: CT ABDOMEN AND PELVIS WITH CONTRAST TECHNIQUE: Multidetector CT imaging of the abdomen and pelvis was performed using the standard protocol following bolus administration of intravenous contrast. RADIATION DOSE REDUCTION: This exam was performed according to the departmental dose-optimization program which includes automated exposure  control, adjustment of the mA and/or kV according to patient size and/or use of iterative reconstruction technique. CONTRAST:  OMNIPAQUE IOHEXOL 300 MG/ML  SOLN COMPARISON:  04/04/2022 FINDINGS: Lower chest: Lung bases are clear. Hepatobiliary: Indwelling biliary catheters extend from the LEFT and RIGHT extrahepatic ducts through the common bile duct to the duodenum. Mild biliary duct dilatation in the LEFT hepatic lobe. Mild periportal edema is also present. Small low-density lesion in the RIGHT hepatic lobe (image 60/2) measures 6 mm and not changed. No enhancing lesion is evident. Pancreas: No pancreatic duct dilatation. No pancreatic lesion identified. Spleen: Normal spleen Adrenals/urinary tract: Adrenal glands normal. Large round nonobstructing calculus in the upper LEFT kidney measures 10 mm. Similar calculus in the mid RIGHT kidney. Ureters and bladder normal. Stomach/Bowel: Stomach normal. There is a stent within the third portion duodenum. This likely represent migrated pancreatic duct stent or common bile duct stent. No evidence of bowel obstruction. Small bowel normal. Appendix not identified. Colon rectosigmoid colon normal. Vascular/Lymphatic: Abdominal aorta is normal caliber. No periportal or retroperitoneal adenopathy. No pelvic adenopathy. Reproductive: Uterus and adnexa unremarkable. Other: Peritoneal nodular metastasis again noted. Nodes in the RIGHT ventral peritoneal space/lesser omentum are most prominent. For example 19 mm by 13 mm implant beneath the RIGHT rectus muscle ( image 50/2) compares to 20 mm x 14 mm for no interval change. Similar RIGHT upper quadrant nodule measures 13 mm image 44/2 compared 18 mm. Nodularity along the LEFT pericolic gutter again noted. Example cluster of nodules measures 13 mm image 49/2 compared to 15 mm. Mild nodularity along the RIGHT pericolic gutter to lesser degree. Small amount free fluid in the LEFT pelvis is unchanged. Musculoskeletal: No  aggressive osseous lesion. 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 No significant change from CT 04/04/2022. 2. Indwelling biliary catheters with mild biliary duct dilatation in the LEFT hepatic lobe. 3. Stable peritoneal nodular metastasis. 4. Migrated catheter in the third portion duodenum, no change from prior. Electronically Signed   By: Genevive Bi M.D.   On: 04/16/2022 15:22   IR PICC PLACEMENT LEFT >5 YRS INC IMG GUIDE  Result Date: 04/11/2022 INDICATION: 47 year old with metastatic colon cancer. PICC line needed for chemotherapy. EXAM: 1. Placement of peripherally inserted central venous catheter using ultrasound and fluoroscopic guidance 2. Right upper extremity venogram MEDICATIONS: 1% lidocaine for local anesthetic ANESTHESIA/SEDATION: None FLUOROSCOPY: Radiation Exposure Index (as provided by the fluoroscopic device): 6 mGy Kerma CONTRAST:  5 mL Omnipaque 300 COMPLICATIONS: None immediate. PROCEDURE: The patient was advised of the possible risks and complications and agreed to undergo the procedure. The patient was then brought to the angiographic suite for the procedure. The right arm was prepped with chlorhexidine, draped in the usual sterile fashion using maximum barrier technique (cap and mask, sterile gown, sterile gloves, large sterile sheet, hand hygiene and cutaneous antisepsis) and infiltrated locally with 1% Lidocaine. Ultrasound demonstrated patency of the right brachial vein, and this was documented with an image. Under real-time ultrasound guidance,  this vein was accessed with a 21 gauge micropuncture needle and image documentation was performed. A 0.018 wire was introduced into the vein. The wire would not advance centrally. Therefore, venogram was performed. 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 The right upper extremity was not amenable for PICC line placement. The  catheter was removed from the right upper arm. Dressing was placed at the puncture site. Attention was directed to the left arm. Ultrasound confirmed a patent left basilic vein. Ultrasound image was saved for documentation. Left arm was prepped and draped in sterile fashion. Maximal barrier sterile technique was utilized including caps, mask, sterile gowns, sterile gloves, sterile drape, hand hygiene and skin antiseptic. Skin was anesthetized using 1% lidocaine. 21 gauge needle was directed into the left basilic vein using ultrasound guidance. A 0.018 wire easily advanced centrally from the left arm. A peel-away sheath was placed. A dual lumen power injectable PICC line was cut to 39 cm. The catheter easily advanced through the peel-away sheath and into the central veins. Catheter tip was positioned at the superior cavoatrial junction with fluoroscopy. Both lumens aspirated and flushed well. Both lumens were flushed with heparinized saline. The left arm PICC line was secured in place. Catheter length: 39 cm IMPRESSION: 1. Successful placement of a left arm power injectable dual-lumen PICC line. PICC line tip is at the superior cavoatrial junction and ready to be used. 2. Right arm PICC line could not be placed due to occlusion of the right subclavian vein. Right arm is not amendable to future PICC line placement. Electronically Signed   By: Richarda Overlie M.D.   On: 04/11/2022 14:58   IR Veno/Ext/Uni Right  Result Date: 04/11/2022 INDICATION: 47 year old with metastatic colon cancer. PICC line needed for chemotherapy. EXAM: 1. Placement of peripherally inserted central venous catheter using ultrasound and fluoroscopic guidance 2. Right upper extremity venogram MEDICATIONS: 1% lidocaine for local anesthetic ANESTHESIA/SEDATION: None FLUOROSCOPY: Radiation Exposure Index (as provided by the fluoroscopic device): 6 mGy Kerma CONTRAST:  5 mL Omnipaque 300 COMPLICATIONS: None immediate. PROCEDURE: The patient was advised  of the possible risks and complications and agreed to undergo the procedure. The patient was then brought to the angiographic suite for the procedure. The right arm was prepped with chlorhexidine, draped in the usual sterile fashion using maximum barrier technique (cap and mask, sterile gown, sterile gloves, large sterile sheet, hand hygiene and cutaneous antisepsis) and infiltrated locally with 1% Lidocaine. Ultrasound demonstrated patency of the right brachial vein, and this was documented with an image. Under real-time ultrasound guidance, this vein was accessed with a 21 gauge micropuncture needle and image documentation was performed. A 0.018 wire was introduced into the vein. The wire would not advance centrally. Therefore, venogram was performed. 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 The right upper extremity was not amenable for PICC line placement. The catheter was removed from the right upper arm. Dressing was placed at the puncture site. Attention was directed to the left arm. Ultrasound confirmed a patent left basilic vein. Ultrasound image was saved for documentation. Left arm was prepped and draped in sterile fashion. Maximal barrier sterile technique was utilized including caps, mask, sterile gowns, sterile gloves, sterile drape, hand hygiene and skin antiseptic. Skin was anesthetized using 1% lidocaine. 21 gauge needle was directed into the left basilic vein using ultrasound guidance. A 0.018 wire easily advanced centrally from the left arm. A peel-away sheath was placed. A  dual lumen power injectable PICC line was cut to 39 cm. The catheter easily advanced through the peel-away sheath and into the central veins. Catheter tip was positioned at the superior cavoatrial junction with fluoroscopy. Both lumens aspirated and flushed well. Both lumens were flushed with heparinized saline. The left arm PICC line was secured in place. Catheter  length: 39 cm IMPRESSION: 1. Successful placement of a left arm power injectable dual-lumen PICC line. PICC line tip is at the superior cavoatrial junction and ready to be used. 2. Right arm PICC line could not be placed due to occlusion of the right subclavian vein. Right arm is not amendable to future PICC line placement. Electronically Signed   By: Richarda Overlie M.D.   On: 04/11/2022 14:58   DG Abd 2 Views  Result Date: 04/07/2022 CLINICAL DATA:  Evaluate biliary stent EXAM: ABDOMEN - 2 VIEW COMPARISON:  March 31, 2022 FINDINGS: Two common bile duct stents are in stable position. The previously placed pancreatic stent continues to migrate inferiorly. Previous CT imaging demonstrated the pancreatic stent within duodenum. Renal stones are again noted. No other significant interval changes. IMPRESSION: 1. The previously placed pancreatic stent continues to migrate inferiorly. Previous CT imaging demonstrated that it was within the duodenum. 2. The common bile duct stents are in stable position. 3. Renal stones again noted. Electronically Signed   By: Gerome Sam III M.D.   On: 04/07/2022 09:45   CT CHEST ABDOMEN PELVIS W CONTRAST  Result Date: 04/04/2022 CLINICAL DATA:  Restaging metastatic colon cancer. * Tracking Code: BO * EXAM: CT CHEST, ABDOMEN, AND PELVIS WITH CONTRAST TECHNIQUE: Multidetector CT imaging of the chest, abdomen and pelvis was performed following the standard protocol during bolus administration of intravenous contrast. RADIATION DOSE REDUCTION: This exam was performed according to the departmental dose-optimization program which includes automated exposure control, adjustment of the mA and/or kV according to patient size and/or use of iterative reconstruction technique. CONTRAST:  53mL OMNIPAQUE IOHEXOL 300 MG/ML  SOLN COMPARISON:  MRI abdomen 03/22/2022. CT 01/30/2022 and older. Chest CT 08/30/2019. FINDINGS: CT CHEST FINDINGS Cardiovascular: Heart is nonenlarged. No pericardial  effusion. Minimal atherosclerotic changes along the thoracic aorta. Diameter of the ascending aorta at the level of the right pulmonary artery measures 3.7 cm. Mediastinum/Nodes: No specific abnormal lymph node enlargement identified in the axillary region, hilum or mediastinum. Only exception is some prominent anterior right cardiophrenic lymph nodes on series 2, image 47. Normal caliber thoracic esophagus. There is some triangular-shaped soft tissue in the anterior superior mediastinum, likely some residual thymus tissue. This was seen on the prior examination from 2021. Bovine type aortic arch, normal variant. Lungs/Pleura: There is some linear opacity at the bases likely scar or atelectasis. No pleural effusion. Bilateral apical subpleural blebs identified, right-greater-than-left. No discrete lung nodule identified. Musculoskeletal: No chest wall mass or suspicious bone lesions identified. CT ABDOMEN PELVIS FINDINGS Hepatobiliary: There has been placement of 2 adjacent endoscopic biliary stents. The level of intrahepatic biliary duct dilatation has significantly improved. There is some air. Once again there is some ill-defined tissue identified in the hilum of the liver with narrowing of the anterior branch of the right portal vein. Tissue is difficult to measure and compare to the prior but appears subjectively relatively similar. There is some underlying fatty liver infiltration. No separate true space-occupying liver lesion. Gallbladder is nondilated. Pancreas: Minimal ectasia of the pancreatic duct towards the head. No pancreatic mass. No abnormal enhancement. Spleen: Normal in size without focal abnormality. Adrenals/Urinary  Tract: Adrenal glands are preserved. Upper pole left-sided renal stone and lower pole right-sided stone. Both kidneys show some mild atrophy. No enhancing renal mass or collecting system dilatation. Preserved contours of the urinary bladder. Stomach/Bowel: Moderate diffuse colonic  stool. The stomach and small bowel are nondilated. There is a pigtail stent noted along the proximal third portion of the duodenal. Please correlate for any known history. Vascular/Lymphatic: Normal caliber aorta and IVC with some scattered vascular calcifications. No separate abnormal lymph node enlargement identified in the abdomen and pelvis. Reproductive: Uterus is present. Small uterine fibroid. No separate adnexal mass. Other: Multiple peritoneal implants are again identified. These include anterior right upper quadrant, left lateral hemipelvis. There is some trace ascites in the pelvis. Example nodule measured on the prior in January 2024 which measured 14 x 11 mm, today on series 2 image 71 measures 18 x 11 mm. Focus just superomedial to this which previously measured 2.5 x 1.1 cm, today measures 2.3 by 1.4 cm on series 2, image 67. There is overall increase in size and number of lesions. Musculoskeletal: Curvature of the lumbar spine. Scattered degenerative changes. 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 IMPRESSION: Progression of peritoneal implants.  Slight pelvic ascites. Central mass lesion liver again identified with narrowing of the anterior branch of the right portal vein. There has been placement of indwelling paired biliary stents with improving of the biliary ductal dilatation. There are some peripheral areas of residual biliary ductal dilatation. No separate intrahepatic liver lesion at this time. Stable prominent anterior right cardiophrenic lymph nodes. There is separate pigtail stent or catheter noted along the lumen of the proximal duodenal. Please correlate for clinical history. Bilateral nonobstructing renal stones. Moderate colonic stool. Electronically Signed   By: Karen KaysAshok  Gupta M.D.   On: 04/04/2022 14:13   DG Abd 2 Views  Result Date: 03/31/2022 CLINICAL DATA:  Evaluate pancreatic stent. EXAM: ABDOMEN - 2 VIEW COMPARISON:  ERCP March 24, 2022.  KUB March 24, 2022. FINDINGS: Renal stones again noted. Again noted are 2 common bile duct stents. The stents appear to remain in good position. The pancreatic duct stent is again identified. The proximal aspect of the pancreatic duct stent is at the L2-3 level today versus the L1 level previously. The stent appears to have migrated several cm in the interval but appears likely to be at least partially located within the pancreatic duct. IMPRESSION: 1. The pancreatic duct stent appears to have migrated inferiorly several cm in the interval but appears likely to be at least partially located within the pancreatic duct. 2. The common bile duct stents appear to remain in good position. 3. Renal stones again noted. Electronically Signed   By: Gerome Samavid  Williams III M.D.   On: 03/31/2022 17:21   DG Abd 2 Views  Result Date: 03/24/2022 CLINICAL DATA:  Abdominal pain EXAM: ABDOMEN - 2 VIEW COMPARISON:  Abdominal series 03/21/2022. CT abdomen and pelvis 01/30/2022. FINDINGS: Two new biliary stents are in place. There is no free intraperitoneal air. Bilateral renal calculi appear unchanged. Bowel-gas pattern is nonobstructive. There is moderate gaseous distention of the stomach. Lung bases are clear. No fractures are seen. IMPRESSION: 1. Two new biliary stents are in place. 2. Bilateral renal calculi appear unchanged. 3. Moderate gaseous distention of the stomach. No bowel obstruction. Electronically Signed   By: Darliss CheneyAmy  Guttmann M.D.   On: 03/24/2022 17:17   DG CHEST PORT 1 VIEW  Result Date: 03/24/2022 CLINICAL DATA:  Abdominal pain EXAM: PORTABLE CHEST 1 VIEW COMPARISON:  Portable exam 1649 hours compared to 07/05/2021 FINDINGS: Normal heart size, mediastinal contours, and pulmonary vascularity. Lungs clear. No pulmonary infiltrate, pleural effusion, pneumothorax, or acute osseous findings. IMPRESSION: No acute abnormalities. Electronically Signed   By: Ulyses Southward M.D.   On: 03/24/2022 17:12   DG ERCP  Result Date:  03/24/2022 CLINICAL DATA:  ERCP. 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 EXAM: ERCP TECHNIQUE: Multiple spot images obtained with the fluoroscopic device and submitted for interpretation post-procedure. FLUOROSCOPY TIME: FLUOROSCOPY TIME 8 minutes, 28 seconds (71.2 mGy) COMPARISON:  MRCP-03/22/2022 FINDINGS: 19 spot intraoperative fluoroscopic images of the right upper abdominal quadrant during ERCP are provided for review. Initial image demonstrates an ERCP probe overlying the right upper abdominal quadrant. Subsequent images demonstrate selective cannulation of the pancreatic duct, ultimately with pancreatic stent placement. Subsequent images demonstrate selective cannulation and opacification of the common bile duct. There is abrupt occlusion of the central aspect of the common hepatic duct at the level of the biliary hilum with faint opacification of the intrahepatic biliary tree which appears moderately dilated. Completion images demonstrate placement of parallel positioned CBD stents, one of which appears positioned within the central aspect of the right intrahepatic biliary tree while the other appears positioned within the central aspect of the left intrahepatic biliary tree. IMPRESSION: ERCP with pancreatic stent and parallel placed CBD stents traversing abrupt occlusion of the central aspect of the common hepatic duct. These images were submitted for radiologic interpretation only. Please see the procedural report for the amount of contrast and the fluoroscopy time utilized. Electronically Signed   By: Simonne Come M.D.   On: 03/24/2022 16:21   MR ABDOMEN MRCP W WO CONTAST  Result Date: 03/23/2022 CLINICAL DATA:  47 year old female with history of metastatic colon cancer. New elevation of liver function tests. Jaundice. EXAM: MRI ABDOMEN WITHOUT AND WITH CONTRAST (INCLUDING MRCP) TECHNIQUE: Multiplanar multisequence MR imaging of the abdomen was performed both before and after  the administration of intravenous contrast. Heavily T2-weighted images of the biliary and pancreatic ducts were obtained, and three-dimensional MRCP images were rendered by post processing. CONTRAST:  64mL GADAVIST GADOBUTROL 1 MMOL/ML IV SOLN COMPARISON:  No prior abdominal MRI. CT the abdomen and pelvis 01/30/2022. FINDINGS: Lower chest: Unremarkable. Hepatobiliary: MRCP images demonstrate severe intrahepatic biliary ductal dilatation. Distal common hepatic bile duct is normal in caliber measuring 5 mm. Common bile duct measures 4 mm in the porta hepatis. Coronal MRCP image 95 of series 8 demonstrates a gap of approximately 1.1 cm between the most proximal patent portion of the common hepatic duct and the remaining patent and dilated intrahepatic biliary tree. On postcontrast images this area is remarkable for a mass-like region of heterogeneous hypoenhancement on early arterial phase imaging which becomes a more mass-like confluent area of enhancement on post gadolinium delayed images (axial image 56 of series 1104), estimated to measure approximately 2.5 x 1.6 cm. This area demonstrates diffusion restriction. No filling defects in the more distal aspect of the common bile duct are noted to suggest choledocholithiasis. Gallbladder is only moderately distended but contains material that is intermediate to high signal intensity on T1 weighted images and slightly low signal intensity on T2 weighted images, likely reflecting biliary sludge. No pericholecystic fluid or surrounding inflammatory changes. Pancreas: No pancreatic mass. No pancreatic ductal dilatation. No pancreatic or peripancreatic fluid collections or inflammatory changes. Spleen:  Unremarkable. Adrenals/Urinary Tract: 1.6 cm T1 hypointense, T2 hyperintense, nonenhancing lesion  in the medial aspect of the interpolar region of the left kidney, compatible with a small simple cyst (no imaging follow-up recommended). Signal voids are present within the  collecting systems of both kidneys (upper pole of the left kidney and interpolar region of the right kidney), corresponding to large calculi noted on prior CT examination. No aggressive appearing renal lesions are noted. No hydroureteronephrosis in the visualized portions of the abdomen. Bilateral adrenal glands are normal in appearance. Stomach/Bowel: Visualized portions are unremarkable. Vascular/Lymphatic: No aneurysm identified in the visualized abdominal vasculature. No definite lymphadenopathy noted in the abdomen. Other: Numerous enhancing nodules are noted in the peritoneal cavity, indicative of widespread peritoneal carcinomatosis, largest of which is located inferior to the right lobe of the liver (axial image 82 of series 1102) measuring 2.6 x 1.5 cm (previously 2.4 x 0.9 cm on CT examination 01/30/2022 on axial image 23 of series 2 of that study). Trace volume of perihepatic ascites. Musculoskeletal: No aggressive appearing osseous lesions are noted in the visualized portions of the skeleton. IMPRESSION: 1. Centrally located mass in the liver causing obstruction of the confluence of the intrahepatic bile ducts and the proximal common hepatic duct, resulting in severe intrahepatic biliary ductal dilatation. This mass demonstrates progressively increasing enhancement on delayed images. These imaging findings are most compatible with an aggressive neoplasm such as a cholangiocarcinoma, and are not favored to represent a metastatic lesion. 2. Progressive peritoneal carcinomatosis, as above. 3. Additional incidental findings, as above. These results were discussed by telephone at the time of interpretation on 03/23/2022 at 7:45 AM with provider Dr. Adela Lank, who verbally acknowledged these results. Electronically Signed   By: Trudie Reed M.D.   On: 03/23/2022 07:55   DG Abd 2 Views  Result Date: 03/21/2022 CLINICAL DATA:  History of colorectal cancer.  Constipation. EXAM: ABDOMEN - 2 VIEW COMPARISON:   None Available. FINDINGS: Lung bases are clear. Mild S shaped curvature of the thoracolumbar spine. Nonobstructive bowel gas pattern. No pneumatosis. No portal venous gas. No supine evidence of pneumoperitoneum. There are degenerative changes in the lower lumbar spine. Assessment of the sacrum is limited due to overlying bowel gas. Right pelvic phleboliths. Large colonic stool burden. Redemonstrated bilateral nephrolithiasis. There is an additional calcific density projecting along the left psoas muscle which could represent a ureteral calculus. IMPRESSION: 1. Nonobstructive bowel gas pattern. Large colonic stool burden. 2. Redemonstrated bilateral nephrolithiasis. There is an additional calcific density projecting along the left psoas muscle which could represent a ureteral calculus. Recommend further evaluation with bilateral renal ultrasound there is clinical concern for hydronephrosis. Electronically Signed   By: Lorenza Cambridge M.D.   On: 03/21/2022 13:59      Subjective: Patient seen and examined at bedside.  Feels much better and wants to go home today.  Tolerating soft diet.  No fever, chest pain or shortness of breath reported.  Discharge Exam: Vitals:   04/17/22 2140 04/18/22 0506  BP: (!) 157/116 (!) 148/115  Pulse: 98 97  Resp: 16 16  Temp: 99.4 F (37.4 C) 98.9 F (37.2 C)  SpO2: 100% 100%    General: Pt is alert, awake, not in acute distress.  On room air.  Looks chronically ill and deconditioned. Cardiovascular: rate controlled, S1/S2 + Respiratory: bilateral decreased breath sounds at bases Abdominal: Soft, NT, ND, bowel sounds + Extremities: no edema, no cyanosis    The results of significant diagnostics from this hospitalization (including imaging, microbiology, ancillary and laboratory) are listed below for reference.  Microbiology: No results found for this or any previous visit (from the past 240 hour(s)).   Labs: BNP (last 3 results) No results for input(s):  "BNP" in the last 8760 hours. Basic Metabolic Panel: Recent Labs  Lab 04/14/22 0845 04/16/22 1152 04/17/22 0500 04/18/22 0500  NA 141 138 136 135  K 3.3* 2.9* 3.7 3.9  CL 106 97* 102 102  CO2 27 27 27 27   GLUCOSE 95 119* 95 94  BUN 16 19 7 7   CREATININE 0.63 0.75 0.57 0.59  CALCIUM 9.3 9.8 8.7* 8.7*  MG  --   --  2.1 2.0   Liver Function Tests: Recent Labs  Lab 04/14/22 0845 04/16/22 1152 04/17/22 0500 04/18/22 0500  AST 22 37 38 27  ALT 16 29 28 24   ALKPHOS 195* 246* 174* 163*  BILITOT 0.8 1.3* 1.1 1.3*  PROT 7.0 8.7* 6.5 6.7  ALBUMIN 4.1 4.7 3.5 3.6   Recent Labs  Lab 04/16/22 1152  LIPASE 33   No results for input(s): "AMMONIA" in the last 168 hours. CBC: Recent Labs  Lab 04/14/22 0845 04/16/22 1152 04/17/22 0500  WBC 7.0 11.1* 6.9  NEUTROABS 3.7 9.7*  --   HGB 11.4* 13.6 11.2*  HCT 34.6* 41.1 33.9*  MCV 95.1 93.6 94.7  PLT 265 278 193   Cardiac Enzymes: No results for input(s): "CKTOTAL", "CKMB", "CKMBINDEX", "TROPONINI" in the last 168 hours. BNP: Invalid input(s): "POCBNP" CBG: No results for input(s): "GLUCAP" in the last 168 hours. D-Dimer No results for input(s): "DDIMER" in the last 72 hours. Hgb A1c No results for input(s): "HGBA1C" in the last 72 hours. Lipid Profile No results for input(s): "CHOL", "HDL", "LDLCALC", "TRIG", "CHOLHDL", "LDLDIRECT" in the last 72 hours. Thyroid function studies No results for input(s): "TSH", "T4TOTAL", "T3FREE", "THYROIDAB" in the last 72 hours.  Invalid input(s): "FREET3" Anemia work up No results for input(s): "VITAMINB12", "FOLATE", "FERRITIN", "TIBC", "IRON", "RETICCTPCT" in the last 72 hours. Urinalysis    Component Value Date/Time   COLORURINE YELLOW 04/16/2022 1152   APPEARANCEUR CLEAR 04/16/2022 1152   LABSPEC 1.039 (H) 04/16/2022 1152   PHURINE 5.0 04/16/2022 1152   GLUCOSEU NEGATIVE 04/16/2022 1152   HGBUR MODERATE (A) 04/16/2022 1152   BILIRUBINUR NEGATIVE 04/16/2022 1152    KETONESUR NEGATIVE 04/16/2022 1152   PROTEINUR 30 (A) 04/16/2022 1152   NITRITE NEGATIVE 04/16/2022 1152   LEUKOCYTESUR SMALL (A) 04/16/2022 1152   Sepsis Labs Recent Labs  Lab 04/14/22 0845 04/16/22 1152 04/17/22 0500  WBC 7.0 11.1* 6.9   Microbiology No results found for this or any previous visit (from the past 240 hour(s)).   Time coordinating discharge: 35 minutes  SIGNED:   Glade Lloyd, MD  [facility] 04/18/2022, 9:11 AM

## 2022-04-21 ENCOUNTER — Telehealth: Payer: Self-pay

## 2022-04-21 ENCOUNTER — Other Ambulatory Visit: Payer: Self-pay

## 2022-04-21 NOTE — Transitions of Care (Post Inpatient/ED Visit) (Signed)
   04/21/2022  Name: April [name] MRN: [id number] DOB: 08-27-1975  Today's TOC FU Call Status: Today's TOC FU Call Status:: Successful TOC FU Call Competed TOC FU Call Complete Date: 04/21/22  Transition Care Management Follow-up Telephone Call Date of Discharge: 04/18/22 Discharge Facility: [facility]) Type of Discharge: Inpatient Admission Primary Inpatient Discharge Diagnosis:: "nausea,vomiting" How have you been since you were released from the hospital?: Better (Brief call as she is resting-states she has been feeling better-Pain better controlled. She has had no further GI issues. Appetite is fair-picking up some.) Any questions or concerns?: No  Items Reviewed: Did you receive and understand the discharge instructions provided?: Yes Medications obtained and verified?: No (patient did not feel up to med review-states she is aware of med changes and how to take her meds-plans to talk to oncologist as pain regimen was changed, also will discuss with PCP about Lopressor being changed) Any new allergies since your discharge?: No Dietary orders reviewed?: Yes Type of Diet Ordered:: low salt/heart healthy Do you have support at home?: Yes People in Home: parent(s) Name of Support/Comfort Primary Source: mom-Parthenia  Home Care and Equipment/Supplies: Were Home Health Services Ordered?: NA Any new equipment or medical supplies ordered?: NA  Functional Questionnaire: Do you need assistance with bathing/showering or dressing?: No Do you need assistance with meal preparation?: No Do you have difficulty maintaining continence: No Do you need assistance with getting out of bed/getting out of a chair/moving?: No Do you have difficulty managing or taking your medications?: No  Follow up appointments reviewed: PCP Follow-up appointment confirmed?: Yes Date of PCP follow-up appointment?: 04/25/22 Follow-up Provider: Llana Aliment Specialist [facility] Follow-up appointment  confirmed?: Yes Date of Specialist follow-up appointment?: 04/28/22 Follow-Up Specialty Provider:: Virl Diamond, RD appt-04/23/22 Do you need transportation to your follow-up appointment?: No (pt states she will call to arrange ride) Do you understand care options if your condition(s) worsen?: Yes-patient verbalized understanding  SDOH Interventions Today    Flowsheet Row Most Recent Value  SDOH Interventions   Transportation Interventions Other (Comment)  [pt states she uses transportation services-has number to call and arrange ride at least 3 business days before appts]      TOC Interventions Today    Flowsheet Row Most Recent Value  TOC Interventions   TOC Interventions Discussed/Reviewed TOC Interventions Discussed, Arranged PCP follow up within 7 days/Care Guide scheduled      Interventions Today    Flowsheet Row Most Recent Value  Chronic Disease   Chronic disease during today's visit Hypertension (HTN), Other  [cancer]  General Interventions   General Interventions Discussed/Reviewed General Interventions Discussed, Doctor Visits  Doctor Visits Discussed/Reviewed Doctor Visits Discussed, PCP, Specialist  PCP/Specialist Visits Compliance with follow-up visit  Education Interventions   Education Provided Provided Education  Provided Verbal Education On Nutrition, When to see the doctor, Medication, Other  [pain mgmt, sx mgmt]  Nutrition Interventions   Nutrition Discussed/Reviewed Nutrition Discussed, Adding fruits and vegetables, Increaing proteins, Decreasing salt, Fluid intake  Pharmacy Interventions   Pharmacy Dicussed/Reviewed Pharmacy Topics Discussed, Medications and their functions       Alessandra Grout [facility] Care Management Care Management Community Coordinator Direct Phone: [phone] Toll Free: [phone] Fax: [phone]

## 2022-04-22 ENCOUNTER — Telehealth: Payer: Self-pay | Admitting: *Deleted

## 2022-04-22 ENCOUNTER — Other Ambulatory Visit: Payer: Self-pay | Admitting: Nurse Practitioner

## 2022-04-22 DIAGNOSIS — C20 Malignant neoplasm of rectum: Secondary | ICD-10-CM

## 2022-04-22 DIAGNOSIS — C786 Secondary malignant neoplasm of retroperitoneum and peritoneum: Secondary | ICD-10-CM

## 2022-04-22 MED ORDER — HYDROCODONE-ACETAMINOPHEN 10-325 MG PO TABS
1.0000 | ORAL_TABLET | Freq: Four times a day (QID) | ORAL | 0 refills | Status: DC | PRN
Start: 2022-04-22 — End: 2022-05-07

## 2022-04-22 NOTE — Telephone Encounter (Signed)
Called to request refill on her hydrocodone-apap. Hospital d/c her on 5/325 every 6 hours prn (#20 on 4/5), but she is requesting to return to the 10/325 that our office had ordered in the past.  [facility] [address]

## 2022-04-23 ENCOUNTER — Inpatient Hospital Stay: Payer: 59 | Admitting: Nutrition

## 2022-04-23 ENCOUNTER — Inpatient Hospital Stay: Payer: 59

## 2022-04-23 VITALS — BP 144/100 | HR 88 | Temp 97.7°F | Resp 18

## 2022-04-23 DIAGNOSIS — Z79899 Other long term (current) drug therapy: Secondary | ICD-10-CM | POA: Diagnosis not present

## 2022-04-23 DIAGNOSIS — Z95828 Presence of other vascular implants and grafts: Secondary | ICD-10-CM

## 2022-04-23 DIAGNOSIS — C786 Secondary malignant neoplasm of retroperitoneum and peritoneum: Secondary | ICD-10-CM | POA: Diagnosis not present

## 2022-04-23 DIAGNOSIS — Z5111 Encounter for antineoplastic chemotherapy: Secondary | ICD-10-CM | POA: Diagnosis not present

## 2022-04-23 DIAGNOSIS — C20 Malignant neoplasm of rectum: Secondary | ICD-10-CM

## 2022-04-23 DIAGNOSIS — Z452 Encounter for adjustment and management of vascular access device: Secondary | ICD-10-CM | POA: Diagnosis not present

## 2022-04-23 DIAGNOSIS — C187 Malignant neoplasm of sigmoid colon: Secondary | ICD-10-CM | POA: Diagnosis not present

## 2022-04-23 MED ORDER — HEPARIN SOD (PORK) LOCK FLUSH 100 UNIT/ML IV SOLN
500.0000 [IU] | Freq: Once | INTRAVENOUS | Status: AC
  Administered 2022-04-23: 250 [IU] via INTRAVENOUS

## 2022-04-23 MED ORDER — HEPARIN SOD (PORK) LOCK FLUSH 100 UNIT/ML IV SOLN
500.0000 [IU] | Freq: Once | INTRAVENOUS | Status: AC
  Administered 2022-04-23: 250 [IU]

## 2022-04-23 MED ORDER — SODIUM CHLORIDE 0.9% FLUSH
10.0000 mL | Freq: Once | INTRAVENOUS | Status: AC
  Administered 2022-04-23: 10 mL

## 2022-04-23 MED ORDER — SODIUM CHLORIDE 0.9% FLUSH
10.0000 mL | Freq: Once | INTRAVENOUS | Status: AC
  Administered 2022-04-23: 10 mL via INTRAVENOUS

## 2022-04-23 NOTE — Progress Notes (Signed)
Nutrition follow up completed with patient in infusion room.   Patient is s/p cycle 1 FOLFOX on April 1 given for treatment of progressive Colon Cancer. She was admitted on April 3 with abdominal pain, nausea and vomiting, likely from toxicity of Oxaliplatin. Diagnosed with Severe Malnutrition in acute illness.  Wt of 153 # documented on April 3 decreased from 158 # in January 2024.  Labs reviewed.  Patient is fearful of taking the next treatment. States she thought she was dying after the first one. Her biggest concern is developing severe nausea and vomiting. Complained of abdominal pain. Complains of excess gas. She reports diarrhea also occurred. She has purchased some Gatorade just in case she needs it and states she is going to take her antiemetics as soon as she can. She feels well today.  Nutrition diagnosis: Food and Nutrition Related Knowledge Deficit related to cancer and associated treatments as evidenced by patient questions surrounding nausea and vomiting.  Intervention: Educated on importance of bland foods and increased liquids after treatment. Encouraged medication as prescribed for nausea and vomiting. Reviewed foods to choose and foods to minimize. Educated on foods for diarrhea. Discussed using prescribed laxatives if constipation develops. Increase fluids as tolerated.  Provided nutrition fact sheets, recipes, and 1 complimentary case of Ensure Plus HP. Questions answered.  Monitoring, Evaluation, Goals: Tolerate oral diet to minimize nutrition impact symptoms.  Next Visit: To be scheduled as needed.

## 2022-04-23 NOTE — Patient Instructions (Signed)
PICC Home Care Guide A peripherally inserted central catheter (PICC) is a form of IV access that allows medicines and IV fluids to be quickly put into the blood and spread throughout the body. The PICC is a long, thin, flexible tube (catheter) that is put into a vein in a person's arm or leg. The catheter ends in a large vein just outside the heart called the superior vena cava (SVC). After the PICC is put in, a chest X-ray may be done to make sure that it is in the right place. A PICC may be placed for different reasons, such as: To give medicines and liquid nutrition. To give IV fluids and blood products. To take blood samples often. If there is trouble placing a peripheral intravenous (PIV) catheter. If cared for properly, a PICC can remain in place for many months. Having a PICC can allow you to go home from the hospital sooner and continue treatment at home. Medicines and PICC care can be managed at home by a family member, caregiver, or home health care team. What are the risks? Generally, having a PICC is safe. However, problems may occur, including: A blood clot (thrombus) forming in or at the end of the PICC. A blood clot forming in a vein (deep vein thrombosis) or traveling to the lung (pulmonary embolism). Inflammation of the vein (phlebitis) in which the PICC is placed. Infection at the insertion site or in the blood. Blood infections from central lines, like PICCs, can be serious and often require a hospital stay. PICC malposition, or PICC movement or poor placement. A break or cut in the PICC. Do not use scissors near the PICC. Nerve or tendon irritation or injury during PICC insertion. How to care for your PICC Please follow the specific guidelines provided by your health care provider. Preventing infection You and any caregivers should wash your hands often with soap and water for at least 20 seconds. Wash hands: Before touching the PICC or the infusion device. Before changing a  bandage (dressing). Do not change the dressing unless you have been taught to do so and have shown you are able to change it safely. Flush the PICC as told. Tell your health care provider right away if the PICC is hard to flush or does not flush. Do not use force to flush the PICC. Use clean and germ-free (sterile) supplies only. Keep the supplies in a dry place. Do not reuse needles, syringes, or any other supplies. Reusing supplies can lead to infection. Keep the PICC dressing dry and secure it with tape if the edges stop sticking to your skin. Check your PICC insertion site every day for signs of infection. Check for: Redness, swelling, or pain. Fluid or blood. Warmth. Pus or a bad smell. Preventing other problems Do not use a syringe that is less than 10 mL to flush the PICC. Do not have your blood pressure checked on the arm in which the PICC is placed. Do not ever pull or tug on the PICC. Keep it secured to your arm with tape or a stretch wrap when not in use. Do not take the PICC out yourself. Only a trained health care provider should remove the PICC. Keep pets and children away from your PICC. How to care for your PICC dressing Keep your PICC dressing clean and dry to prevent infection. Do not take baths, swim, or use a hot tub until your health care provider approves. Ask your health care provider if you can take   showers. You may only be allowed to take sponge baths. When you are allowed to shower: Ask your health care provider to teach you how to wrap the PICC. Cover the PICC with clear plastic wrap and tape to keep it dry while showering. Follow instructions from your health care provider about how to take care of your insertion site and dressing. Make sure you: Wash your hands with soap and water for at least 20 seconds before and after you change your dressing. If soap and water are not available, use hand sanitizer. Change your dressing only if taught to do so by your health care  provider. Your PICC dressing needs to be changed if it becomes loose or wet. Leave stitches (sutures), skin glue, or adhesive strips in place. These skin closures may need to stay in place for 2 weeks or longer. If adhesive strip edges start to loosen and curl up, you may trim the loose edges. Do not remove adhesive strips completely unless your health care provider tells you to do that. Follow these instructions at home: Disposal of supplies Throw away any syringes in a disposal container that is meant for sharp items (sharps container). You can buy a sharps container from a pharmacy, or you can make one by using an empty, hard plastic bottle with a lid. Place any used dressings or infusion bags into a plastic bag. Throw that bag in the trash. General instructions  Always carry your PICC identification card or wear a medical alert bracelet. Keep the tube clamped at all times, unless it is being used. Always carry a smooth-edge clamp with you to clamp the PICC if it breaks. Do not use scissors or sharp objects near the tube. You may bend your arm and move it freely. If your PICC is near or at the bend of your elbow, avoid activity with repeated motion at the elbow. Avoid lifting heavy objects as told by your health care provider. Keep all follow-up visits. This is important. You will need to have your PICC dressing changed at least once a week. Contact a health care provider if: You have pain in your arm, ear, face, or teeth. You have a fever or chills. You have redness, swelling, or pain around the insertion site. You have fluid or blood coming from the insertion site. Your insertion site feels warm to the touch. You have pus or a bad smell coming from the insertion site. Your skin feels hard and raised around the insertion site. Your PICC dressing has gotten wet or is coming off and you have not been taught how to change it. Get help right away if: You have problems with your PICC, such as  your PICC: Was tugged or pulled and has partially come out. Do not  push the PICC back in. Cannot be flushed, is hard to flush, or leaks around the insertion site when it is flushed. Makes a flushing sound when it is flushed. Appears to have a hole or tear. Is accidentally pulled all the way out. If this happens, cover the insertion site with a gauze dressing. Do not throw the PICC away. Your health care provider will need to check it to be sure the entire catheter came out. You feel your heart racing or skipping beats, or you have chest pain. You have shortness of breath or trouble breathing. You have swelling, redness, warmth, or pain in the arm in which the PICC is placed. You have a red streak going up your arm that   starts under the PICC dressing. These symptoms may be an emergency. Get help right away. Call 911. Do not wait to see if the symptoms will go away. Do not drive yourself to the hospital. Summary A peripherally inserted central catheter (PICC) is a long, thin, flexible tube (catheter) that is put into a vein in the arm or leg. If cared for properly, a PICC can remain in place for many months. Having a PICC can allow you to go home from the hospital sooner and continue treatment at home. The PICC is inserted using a germ-free (sterile) technique by a specially trained health care provider. Only a trained health care provider should remove it. Do not have your blood pressure checked on the arm in which your PICC is placed. Always keep your PICC identification card with you. This information is not intended to replace advice given to you by your health care provider. Make sure you discuss any questions you have with your health care provider. Document Revised: 07/18/2020 Document Reviewed: 07/18/2020 Elsevier Patient Education  2023 Elsevier Inc.  

## 2022-04-25 ENCOUNTER — Encounter: Payer: Self-pay | Admitting: Nurse Practitioner

## 2022-04-25 ENCOUNTER — Ambulatory Visit (INDEPENDENT_AMBULATORY_CARE_PROVIDER_SITE_OTHER): Payer: 59 | Admitting: Nurse Practitioner

## 2022-04-25 VITALS — BP 134/82 | HR 108 | Wt 147.8 lb

## 2022-04-25 DIAGNOSIS — I10 Essential (primary) hypertension: Secondary | ICD-10-CM

## 2022-04-25 DIAGNOSIS — C19 Malignant neoplasm of rectosigmoid junction: Secondary | ICD-10-CM

## 2022-04-25 DIAGNOSIS — E876 Hypokalemia: Secondary | ICD-10-CM | POA: Diagnosis not present

## 2022-04-25 DIAGNOSIS — R9431 Abnormal electrocardiogram [ECG] [EKG]: Secondary | ICD-10-CM | POA: Diagnosis not present

## 2022-04-25 DIAGNOSIS — Z87891 Personal history of nicotine dependence: Secondary | ICD-10-CM | POA: Diagnosis not present

## 2022-04-25 MED ORDER — AMLODIPINE BESYLATE-VALSARTAN 10-320 MG PO TABS
1.0000 | ORAL_TABLET | Freq: Every day | ORAL | 2 refills | Status: DC
Start: 2022-04-25 — End: 2022-06-30

## 2022-04-25 MED ORDER — METOPROLOL SUCCINATE ER 50 MG PO TB24
50.0000 mg | ORAL_TABLET | Freq: Every day | ORAL | 3 refills | Status: DC
Start: 2022-04-25 — End: 2022-06-02

## 2022-04-25 NOTE — Patient Instructions (Addendum)
I have sent in the Amlodipine- Valsartan combination pill for you to try.  Stop the amlodipine and hydralazine.   I have changed your metoprolol to the long acting formula and your pill strength to the 50mg  so you will only need to take this once a day.   Watch your blood pressure. If it is averaging less than 130/85 then we will keep going with this dose. If it goes higher than that (on average), then I will need to make some adjustments, just keep me posted.   I will send my note to Dr. Truett Perna and let him know about the request for chemo.

## 2022-04-25 NOTE — Progress Notes (Signed)
Tollie Eth, DNP, AGNP-c [facility] [address] [place], [place] [address] [phone]  Subjective:   April [name] is a 47 y.o. female presents to day for evaluation of:  Mildreth presents today with concerns following her most recent chemotherapy treatment that led to hospitalization for dehydration. She experienced a severe reaction characterized by significant nausea and vomiting, which resulted in weakness and difficulty in replenishing herself. The patient expresses apprehension regarding her upcoming chemotherapy session and has a preference for inpatient care to better manage the side effects, as opposed to handling them at home.  Additionally, the patient has concerns regarding recent changes to her blood pressure medication regimen. During a recent hospitalization, her hydrochlorothiazide was discontinued and replaced with hydralazine, which she reports disliking due to the increased dosing frequency of three times daily. She continues to take metoprolol and amlodipine for blood pressure management.  The patient notes a history of hypokalemia, attributed to the vomiting and diarrhea experienced, but reports that her potassium levels have since normalized. She also reports that her kidney function has been stable and unaffected.  Post-chemotherapy, the patient has observed a decrease in pain, leading to a reduced need for hydrocodone. She remains cautiously optimistic about the potential effectiveness of the chemotherapy, though she acknowledges that it is premature to draw conclusions about the treatment's success.  The patient has recently ceased smoking and reports a noticeable improvement in respiratory function, describing an enhanced sensation of breathing in more fresh air.  PMH, Medications, and Allergies reviewed and updated in chart as appropriate.   ROS negative except for what is listed in HPI. Objective:  BP 134/82   Pulse (!) 108   Wt 147  lb 12.8 oz (67 kg)   LMP 01/07/2006   BMI 25.37 kg/m  Physical Exam Vitals and nursing note reviewed.  Constitutional:      General: She is not in acute distress.    Appearance: Normal appearance.  HENT:     Head: Normocephalic.  Eyes:     Pupils: Pupils are equal, round, and reactive to light.  Neck:     Vascular: No carotid bruit.  Cardiovascular:     Rate and Rhythm: Normal rate and regular rhythm.     Pulses: Normal pulses.     Heart sounds: Normal heart sounds.  Pulmonary:     Effort: Pulmonary effort is normal.     Breath sounds: Normal breath sounds.  Abdominal:     General: Bowel sounds are normal. There is no distension.     Palpations: Abdomen is soft.     Tenderness: There is no abdominal tenderness. There is no guarding.  Musculoskeletal:     Cervical back: Normal range of motion.     Right lower leg: No edema.     Left lower leg: No edema.  Lymphadenopathy:     Cervical: No cervical adenopathy.  Skin:    General: Skin is warm and dry.     Capillary Refill: Capillary refill takes less than 2 seconds.  Neurological:     General: No focal deficit present.     Mental Status: She is alert and oriented to person, place, and time.     Motor: Weakness present.  Psychiatric:        Mood and Affect: Mood normal.           Assessment & Plan:   Problem List Items Addressed This Visit     Hypertension - Primary    Following a recent hospitalization,  the patient's blood pressure medication regimen was modified, discontinuing hydrochlorothiazide and introducing hydralazine. The patient is dissatisfied with the new regimen and is finding it challenging to manage multiple medications. Plan: - Discontinue hydralazine and reinstate hydrochlorothiazide. - Transition from amlodipine and hydralazine to a combination therapy of amlodipine-valsartan. - Replace metoprolol Tartrate with metoprolol Succinate for once-daily administration. - Instruct the patient to monitor  blood pressure at home and adjust medications as necessary to achieve a target blood pressure of 130/85 mmHg or lower.      Relevant Medications   amLODipine-valsartan (EXFORGE) 10-320 MG tablet   metoprolol succinate (TOPROL-XL) 50 MG 24 hr tablet   Hypokalemia    The patient experienced a decline in potassium levels during the last hospital stay, likely attributable to vomiting and diarrhea. Plan: - Periodically check electrolyte levels, with particular attention during chemotherapy sessions, and address any detected imbalances.       Colorectal cancer, stage IV    The patient has reported experiencing severe nausea, vomiting, and weakness following the initial round of chemotherapy resulting in inpatient hospitalization for several days. She has significant concerns with the next chemotherapy as she is unable to take care of herself at home if she gets this ill again. There is a preference for inpatient care for enhanced monitoring and support due to concerns about subsequent treatments.  The patient has noted a reduction in pain subsequent to the first chemotherapy session, resulting in a decreased need for hydrocodone for pain management. Plan: - Engage in a discussion with Dr. Truett Perna to address the patient's concerns and consider the option of inpatient care during chemotherapy. - Closely observe the patient's reaction to the upcoming round of chemotherapy and make necessary adjustments to the treatment plan. - At this time symptoms have resolved and she appears to be doing well.       Former smoker    The patient has successfully ceased smoking following a recent hospitalization and has noted an improvement in respiratory function. Plan: - Support the patient's efforts to maintain smoking cessation and offer assistance as necessary.      Prolonged QT interval   Relevant Medications   metoprolol succinate (TOPROL-XL) 50 MG 24 hr tablet      Tollie Eth, DNP,  AGNP-c 04/30/2022  8:00 PM    History, Medications, Surgery, SDOH, and Family History reviewed and updated as appropriate.

## 2022-04-27 ENCOUNTER — Other Ambulatory Visit: Payer: Self-pay | Admitting: Oncology

## 2022-04-28 ENCOUNTER — Telehealth: Payer: Self-pay | Admitting: *Deleted

## 2022-04-28 ENCOUNTER — Inpatient Hospital Stay: Payer: 59

## 2022-04-28 ENCOUNTER — Other Ambulatory Visit (HOSPITAL_BASED_OUTPATIENT_CLINIC_OR_DEPARTMENT_OTHER): Payer: Self-pay

## 2022-04-28 ENCOUNTER — Inpatient Hospital Stay (HOSPITAL_BASED_OUTPATIENT_CLINIC_OR_DEPARTMENT_OTHER): Payer: 59 | Admitting: Nurse Practitioner

## 2022-04-28 ENCOUNTER — Encounter: Payer: Self-pay | Admitting: Nurse Practitioner

## 2022-04-28 VITALS — BP 158/98 | HR 79

## 2022-04-28 VITALS — BP 142/90 | HR 100 | Temp 98.1°F | Resp 18 | Ht 64.0 in | Wt 148.6 lb

## 2022-04-28 DIAGNOSIS — Z79899 Other long term (current) drug therapy: Secondary | ICD-10-CM | POA: Diagnosis not present

## 2022-04-28 DIAGNOSIS — C19 Malignant neoplasm of rectosigmoid junction: Secondary | ICD-10-CM | POA: Diagnosis not present

## 2022-04-28 DIAGNOSIS — C20 Malignant neoplasm of rectum: Secondary | ICD-10-CM | POA: Diagnosis not present

## 2022-04-28 DIAGNOSIS — Z452 Encounter for adjustment and management of vascular access device: Secondary | ICD-10-CM | POA: Diagnosis not present

## 2022-04-28 DIAGNOSIS — C187 Malignant neoplasm of sigmoid colon: Secondary | ICD-10-CM | POA: Diagnosis not present

## 2022-04-28 DIAGNOSIS — Z5111 Encounter for antineoplastic chemotherapy: Secondary | ICD-10-CM | POA: Diagnosis not present

## 2022-04-28 DIAGNOSIS — C786 Secondary malignant neoplasm of retroperitoneum and peritoneum: Secondary | ICD-10-CM | POA: Diagnosis not present

## 2022-04-28 LAB — CBC WITH DIFFERENTIAL (CANCER CENTER ONLY)
Abs Immature Granulocytes: 0.01 10*3/uL (ref 0.00–0.07)
Basophils Absolute: 0 10*3/uL (ref 0.0–0.1)
Basophils Relative: 1 %
Eosinophils Absolute: 0.3 10*3/uL (ref 0.0–0.5)
Eosinophils Relative: 5 %
HCT: 34.9 % — ABNORMAL LOW (ref 36.0–46.0)
Hemoglobin: 11.7 g/dL — ABNORMAL LOW (ref 12.0–15.0)
Immature Granulocytes: 0 %
Lymphocytes Relative: 40 %
Lymphs Abs: 2.5 10*3/uL (ref 0.7–4.0)
MCH: 31 pg (ref 26.0–34.0)
MCHC: 33.5 g/dL (ref 30.0–36.0)
MCV: 92.6 fL (ref 80.0–100.0)
Monocytes Absolute: 0.5 10*3/uL (ref 0.1–1.0)
Monocytes Relative: 7 %
Neutro Abs: 2.9 10*3/uL (ref 1.7–7.7)
Neutrophils Relative %: 47 %
Platelet Count: 354 10*3/uL (ref 150–400)
RBC: 3.77 MIL/uL — ABNORMAL LOW (ref 3.87–5.11)
RDW: 13.3 % (ref 11.5–15.5)
WBC Count: 6.3 10*3/uL (ref 4.0–10.5)
nRBC: 0 % (ref 0.0–0.2)

## 2022-04-28 LAB — CMP (CANCER CENTER ONLY)
ALT: 13 U/L (ref 0–44)
AST: 19 U/L (ref 15–41)
Albumin: 4.3 g/dL (ref 3.5–5.0)
Alkaline Phosphatase: 153 U/L — ABNORMAL HIGH (ref 38–126)
Anion gap: 10 (ref 5–15)
BUN: 6 mg/dL (ref 6–20)
CO2: 29 mmol/L (ref 22–32)
Calcium: 9.8 mg/dL (ref 8.9–10.3)
Chloride: 101 mmol/L (ref 98–111)
Creatinine: 0.53 mg/dL (ref 0.44–1.00)
GFR, Estimated: >60 mL/min (ref >60)
Glucose, Bld: 90 mg/dL (ref 70–99)
Potassium: 3.2 mmol/L — ABNORMAL LOW (ref 3.5–5.1)
Sodium: 140 mmol/L (ref 135–145)
Total Bilirubin: 0.6 mg/dL (ref 0.3–1.2)
Total Protein: 7.1 g/dL (ref 6.5–8.1)

## 2022-04-28 LAB — CEA (ACCESS): CEA (CHCC): 13.69 ng/mL — ABNORMAL HIGH (ref 0.00–5.00)

## 2022-04-28 MED ORDER — LEUCOVORIN CALCIUM INJECTION 350 MG
400.0000 mg/m2 | Freq: Once | INTRAVENOUS | Status: AC
  Administered 2022-04-28: 704 mg via INTRAVENOUS
  Filled 2022-04-28: qty 35.2

## 2022-04-28 MED ORDER — DEXTROSE 5 % IV SOLN: Freq: Once | INTRAVENOUS | Status: AC

## 2022-04-28 MED ORDER — DEXAMETHASONE 4 MG PO TABS
ORAL_TABLET | ORAL | 2 refills | Status: DC
Start: 2022-04-28 — End: 2022-08-04

## 2022-04-28 MED ORDER — SODIUM CHLORIDE 0.9 % IV SOLN
150.0000 mg | Freq: Once | INTRAVENOUS | Status: AC
  Administered 2022-04-28: 150 mg via INTRAVENOUS
  Filled 2022-04-28: qty 150

## 2022-04-28 MED ORDER — DEXAMETHASONE 4 MG PO TABS
ORAL_TABLET | ORAL | 2 refills | Status: DC
Start: 2022-04-28 — End: 2022-04-28
  Filled 2022-04-28: qty 12, 6d supply, fill #0

## 2022-04-28 MED ORDER — FLUOROURACIL CHEMO INJECTION 2.5 GM/50ML
400.0000 mg/m2 | Freq: Once | INTRAVENOUS | Status: AC
  Administered 2022-04-28: 700 mg via INTRAVENOUS
  Filled 2022-04-28: qty 14

## 2022-04-28 MED ORDER — SODIUM CHLORIDE 0.9 % IV SOLN
1800.0000 mg/m2 | INTRAVENOUS | Status: DC
  Administered 2022-04-28: 3500 mg via INTRAVENOUS
  Filled 2022-04-28: qty 70

## 2022-04-28 MED ORDER — SODIUM CHLORIDE 0.9 % IV SOLN
10.0000 mg | Freq: Once | INTRAVENOUS | Status: AC
  Administered 2022-04-28: 10 mg via INTRAVENOUS
  Filled 2022-04-28: qty 10

## 2022-04-28 MED ORDER — PROCHLORPERAZINE MALEATE 10 MG PO TABS
10.0000 mg | ORAL_TABLET | Freq: Four times a day (QID) | ORAL | 2 refills | Status: DC | PRN
Start: 2022-04-28 — End: 2022-12-24

## 2022-04-28 MED ORDER — OXALIPLATIN CHEMO INJECTION 100 MG/20ML
85.0000 mg/m2 | Freq: Once | INTRAVENOUS | Status: AC
  Administered 2022-04-28: 150 mg via INTRAVENOUS
  Filled 2022-04-28: qty 20

## 2022-04-28 MED ORDER — PROCHLORPERAZINE MALEATE 10 MG PO TABS
10.0000 mg | ORAL_TABLET | Freq: Four times a day (QID) | ORAL | 2 refills | Status: DC | PRN
Start: 2022-04-28 — End: 2022-04-28
  Filled 2022-04-28: qty 40, 10d supply, fill #0

## 2022-04-28 MED ORDER — PALONOSETRON HCL INJECTION 0.25 MG/5ML
0.2500 mg | Freq: Once | INTRAVENOUS | Status: AC
  Administered 2022-04-28: 0.25 mg via INTRAVENOUS
  Filled 2022-04-28: qty 5

## 2022-04-28 NOTE — Patient Instructions (Signed)
PICC Home Care Guide A peripherally inserted central catheter (PICC) is a form of IV access that allows medicines and IV fluids to be quickly put into the blood and spread throughout the body. The PICC is a long, thin, flexible tube (catheter) that is put into a vein in a person's arm or leg. The catheter ends in a large vein just outside the heart called the superior vena cava (SVC). After the PICC is put in, a chest X-ray may be done to make sure that it is in the right place. A PICC may be placed for different reasons, such as: To give medicines and liquid nutrition. To give IV fluids and blood products. To take blood samples often. If there is trouble placing a peripheral intravenous (PIV) catheter. If cared for properly, a PICC can remain in place for many months. Having a PICC can allow you to go home from the hospital sooner and continue treatment at home. Medicines and PICC care can be managed at home by a family member, caregiver, or home health care team. What are the risks? Generally, having a PICC is safe. However, problems may occur, including: A blood clot (thrombus) forming in or at the end of the PICC. A blood clot forming in a vein (deep vein thrombosis) or traveling to the lung (pulmonary embolism). Inflammation of the vein (phlebitis) in which the PICC is placed. Infection at the insertion site or in the blood. Blood infections from central lines, like PICCs, can be serious and often require a hospital stay. PICC malposition, or PICC movement or poor placement. A break or cut in the PICC. Do not use scissors near the PICC. Nerve or tendon irritation or injury during PICC insertion. How to care for your PICC Please follow the specific guidelines provided by your health care provider. Preventing infection You and any caregivers should wash your hands often with soap and water for at least 20 seconds. Wash hands: Before touching the PICC or the infusion device. Before changing a  bandage (dressing). Do not change the dressing unless you have been taught to do so and have shown you are able to change it safely. Flush the PICC as told. Tell your health care provider right away if the PICC is hard to flush or does not flush. Do not use force to flush the PICC. Use clean and germ-free (sterile) supplies only. Keep the supplies in a dry place. Do not reuse needles, syringes, or any other supplies. Reusing supplies can lead to infection. Keep the PICC dressing dry and secure it with tape if the edges stop sticking to your skin. Check your PICC insertion site every day for signs of infection. Check for: Redness, swelling, or pain. Fluid or blood. Warmth. Pus or a bad smell. Preventing other problems Do not use a syringe that is less than 10 mL to flush the PICC. Do not have your blood pressure checked on the arm in which the PICC is placed. Do not ever pull or tug on the PICC. Keep it secured to your arm with tape or a stretch wrap when not in use. Do not take the PICC out yourself. Only a trained health care provider should remove the PICC. Keep pets and children away from your PICC. How to care for your PICC dressing Keep your PICC dressing clean and dry to prevent infection. Do not take baths, swim, or use a hot tub until your health care provider approves. Ask your health care provider if you can take   showers. You may only be allowed to take sponge baths. When you are allowed to shower: Ask your health care provider to teach you how to wrap the PICC. Cover the PICC with clear plastic wrap and tape to keep it dry while showering. Follow instructions from your health care provider about how to take care of your insertion site and dressing. Make sure you: Wash your hands with soap and water for at least 20 seconds before and after you change your dressing. If soap and water are not available, use hand sanitizer. Change your dressing only if taught to do so by your health care  provider. Your PICC dressing needs to be changed if it becomes loose or wet. Leave stitches (sutures), skin glue, or adhesive strips in place. These skin closures may need to stay in place for 2 weeks or longer. If adhesive strip edges start to loosen and curl up, you may trim the loose edges. Do not remove adhesive strips completely unless your health care provider tells you to do that. Follow these instructions at home: Disposal of supplies Throw away any syringes in a disposal container that is meant for sharp items (sharps container). You can buy a sharps container from a pharmacy, or you can make one by using an empty, hard plastic bottle with a lid. Place any used dressings or infusion bags into a plastic bag. Throw that bag in the trash. General instructions  Always carry your PICC identification card or wear a medical alert bracelet. Keep the tube clamped at all times, unless it is being used. Always carry a smooth-edge clamp with you to clamp the PICC if it breaks. Do not use scissors or sharp objects near the tube. You may bend your arm and move it freely. If your PICC is near or at the bend of your elbow, avoid activity with repeated motion at the elbow. Avoid lifting heavy objects as told by your health care provider. Keep all follow-up visits. This is important. You will need to have your PICC dressing changed at least once a week. Contact a health care provider if: You have pain in your arm, ear, face, or teeth. You have a fever or chills. You have redness, swelling, or pain around the insertion site. You have fluid or blood coming from the insertion site. Your insertion site feels warm to the touch. You have pus or a bad smell coming from the insertion site. Your skin feels hard and raised around the insertion site. Your PICC dressing has gotten wet or is coming off and you have not been taught how to change it. Get help right away if: You have problems with your PICC, such as  your PICC: Was tugged or pulled and has partially come out. Do not  push the PICC back in. Cannot be flushed, is hard to flush, or leaks around the insertion site when it is flushed. Makes a flushing sound when it is flushed. Appears to have a hole or tear. Is accidentally pulled all the way out. If this happens, cover the insertion site with a gauze dressing. Do not throw the PICC away. Your health care provider will need to check it to be sure the entire catheter came out. You feel your heart racing or skipping beats, or you have chest pain. You have shortness of breath or trouble breathing. You have swelling, redness, warmth, or pain in the arm in which the PICC is placed. You have a red streak going up your arm that   starts under the PICC dressing. These symptoms may be an emergency. Get help right away. Call 911. Do not wait to see if the symptoms will go away. Do not drive yourself to the hospital. Summary A peripherally inserted central catheter (PICC) is a long, thin, flexible tube (catheter) that is put into a vein in the arm or leg. If cared for properly, a PICC can remain in place for many months. Having a PICC can allow you to go home from the hospital sooner and continue treatment at home. The PICC is inserted using a germ-free (sterile) technique by a specially trained health care provider. Only a trained health care provider should remove it. Do not have your blood pressure checked on the arm in which your PICC is placed. Always keep your PICC identification card with you. This information is not intended to replace advice given to you by your health care provider. Make sure you discuss any questions you have with your health care provider. Document Revised: 07/18/2020 Document Reviewed: 07/18/2020 Elsevier Patient Education  2023 Elsevier Inc.  

## 2022-04-28 NOTE — Telephone Encounter (Signed)
Left VM requesting the dexamethasone and prochlorperazine sent to downstairs pharmacy be sent to [facility] on [address]. Was not able to pick up meds today due to transportation.

## 2022-04-28 NOTE — Patient Instructions (Addendum)
The chemotherapy medication bag should finish at 46 hours, 96 hours, or 7 days. For example, if your pump is scheduled for 46 hours and it was put on at 4:00 p.m., it should finish at 2:00 p.m. the day it is scheduled to come off regardless of your appointment time.     Estimated time to finish at 11:30am on Wednesday 04/30/22.   If the display on your pump reads "Low Volume" and it is beeping, take the batteries out of the pump and come to the cancer center for it to be taken off.   If the pump alarms go off prior to the pump reading "Low Volume" then call [phone] and someone can assist you.  If the plunger comes out and the chemotherapy medication is leaking out, please use your home chemo spill kit to clean up the spill. Do NOT use paper towels or other household products.  If you have problems or questions regarding your pump, please call either [phone] (24 hours a day) or the cancer center Monday-Friday 8:00 a.m.- 4:30 p.m. at the clinic number and we will assist you. If you are unable to get assistance, then go to the nearest Emergency Department and ask the staff to contact the IV team for assistance.   [facility]  Discharge Instructions: Thank you for choosing [facility] to provide your oncology and hematology care.   If you have a lab appointment with the Cancer Center, please go directly to the Cancer Center and check in at the registration area.   Wear comfortable clothing and clothing appropriate for easy access to any Portacath or PICC line.   We strive to give you quality time with your provider. You may need to reschedule your appointment if you arrive late (15 or more minutes).  Arriving late affects you and other patients whose appointments are after yours.  Also, if you miss three or more appointments without notifying the office, you may be dismissed from the clinic at the provider's discretion.      For  prescription refill requests, have your pharmacy contact our office and allow 72 hours for refills to be completed.    Today you received the following chemotherapy and/or immunotherapy agents Oxaliplatin, Leucovorin and Adrcuil      To help prevent nausea and vomiting after your treatment, we encourage you to take your nausea medication as directed.  BELOW ARE SYMPTOMS THAT SHOULD BE REPORTED IMMEDIATELY: *FEVER GREATER THAN 100.4 F (38 C) OR HIGHER *CHILLS OR SWEATING *NAUSEA AND VOMITING THAT IS NOT CONTROLLED WITH YOUR NAUSEA MEDICATION *UNUSUAL SHORTNESS OF BREATH *UNUSUAL BRUISING OR BLEEDING *URINARY PROBLEMS (pain or burning when urinating, or frequent urination) *BOWEL PROBLEMS (unusual diarrhea, constipation, pain near the anus) TENDERNESS IN MOUTH AND THROAT WITH OR WITHOUT PRESENCE OF ULCERS (sore throat, sores in mouth, or a toothache) UNUSUAL RASH, SWELLING OR PAIN  UNUSUAL VAGINAL DISCHARGE OR ITCHING   Items with * indicate a potential emergency and should be followed up as soon as possible or go to the Emergency Department if any problems should occur.  Please show the CHEMOTHERAPY ALERT CARD or IMMUNOTHERAPY ALERT CARD at check-in to the Emergency Department and triage nurse.  Should you have questions after your visit or need to cancel or reschedule your appointment, please contact [facility]  Dept: [phone]  and follow the prompts.  Office hours are 8:00 a.m. to 4:30 p.m. Monday - Friday. Please note  that voicemails left after 4:00 p.m. may not be returned until the following business day.  We are closed weekends and major holidays. You have access to a nurse at all times for urgent questions. Please call the main number to the clinic Dept: [phone] and follow the prompts.   For any non-urgent questions, you may also contact your provider using MyChart. We now offer e-Visits for anyone 30 and older to request care online for  non-urgent symptoms. For details visit [email or web address].   Also download the MyChart app! Go to the app store, search "MyChart", open the app, select [facility], and log in with your MyChart username and password.

## 2022-04-28 NOTE — Progress Notes (Signed)
Patient seen by Lonna Cobb NP today  Vitals are within treatment parameters.  Labs reviewed by Lonna Cobb NP and are not all within treatment parameters. K+ is 3.2. Misty Stanley is aware of the value of the K+.  Per physician team, patient is ready for treatment and there are NO modifications to the treatment plan.

## 2022-04-28 NOTE — Progress Notes (Signed)
[facility] OFFICE PROGRESS NOTE   Diagnosis: Colon cancer  INTERVAL HISTORY:   April [name] returns as scheduled.  She completed cycle 1 FOLFOX 04/14/2022.  She was hospitalized with acute onset nausea/vomiting 04/16/2022, felt to be secondary to chemotherapy.  She was discharged home 04/18/2022.  She had an episode of vomiting last night.  She denies mouth sores.  No diarrhea.  Cold sensitivity lasted 3 to 4 days.  She reports persistent upper abdominal pain since the ERCP/stent placement.  She is taking hydrocodone more frequently than in the past.  Objective:  Vital signs in last 24 hours:  Blood pressure (!) 142/90, pulse 100, temperature 98.1 F (36.7 C), temperature source Oral, resp. rate 18, height 5\' 4"  (1.626 m), weight 148 lb 9.6 oz (67.4 kg), last menstrual period 01/07/2006, SpO2 100 %.    HEENT: No thrush or ulcers. Resp: Lungs clear bilaterally. Cardio: Regular rate and rhythm. GI: Abdomen is soft.  Marked tenderness at the upper mid abdomen.  No hepatomegaly.  No mass. Vascular: No leg edema. Port-A-Cath without erythema.  Lab Results:  Lab Results  Component Value Date   WBC 6.3 04/28/2022   HGB 11.7 (L) 04/28/2022   HCT 34.9 (L) 04/28/2022   MCV 92.6 04/28/2022   PLT 354 04/28/2022   NEUTROABS 2.9 04/28/2022    Imaging:  No results found.  Medications: I have reviewed the patient's current medications.  Assessment/Plan: Sigmoid colon cancer, stage IV (pT4a,pN2b,M1c) Colonoscopy 03/24/2019-3 rectal polyps-hyperplastic polyps, distal colon biopsy-at least intramucosal adenocarcinoma, completely obstructing mass in the distal sigmoid colon, could not be traversed, intact mismatch repair protein expression 03/24/2019-CEA 56.1 03/29/2019 CT abdomen/pelvis-circumferential thickening involving the entire mid and distal sigmoid colon to the level of the rectosigmoid junction, solid/cystic mass in the left ovary, bilateral nephrolithiasis Robotic  assisted low anterior resection, mesenteric lymphadenectomy, bilateral salpingo-oophorectomy 04/08/2019 Pathology ([facility] review of outside pathology) metastatic adenocarcinoma involving the peritoneum overlying the round ligament, serosa of the urinary bladder, serosa of the right ureter a sacral area, pelvic peritoneum, and left ovary.  Omental biopsy with focal mucin pools with no carcinoma cells identified, right hemidiaphragm biopsy involved by metastatic adenocarcinoma, invasive adenocarcinoma the sigmoid colon, moderately differentiated, T4a, perineural and vascular invasion present, 7/22 lymph nodes, multiple tumor deposits, resection margins negative Negative for PD-L1, low probability of MSI-high, HER-2 negative, negative for BRAF, NRAS and KRAS alterations CTs 05/19/2019-no evidence of metastatic disease, findings suspicious for colitis of the transverse and ascending colon, small amount of ascites in the cul-de-sac, bilateral renal calculi Cycle 1 FOLFIRI 05/24/2019, bevacizumab added with cycle 2 Cycle 4 FOLFIRI/bevacizumab 07/05/2019 Cycle 5 FOLFIRI 07/27/2019, bevacizumab held secondary to hypertension Cycle 6 FOLFIRI 08/10/1999, bevacizumab held secondary to hypertension CTs 08/30/2019-no evidence of recurrent disease, new subsolid right lower lobe nodule felt to be inflammatory, emphysema Cycle 7 FOLFIRI 09/26/2019, bevacizumab remains on hold secondary to hypertension Cycle 8 FOLFIRI 10/17/2019, bevacizumab held secondary to hypertension, Udenyca added for neutropenia Cycle 9 FOLFIRI 11/07/2019, bevacizumab held secondary to hypertension, Udenyca Cycle 10 FOLFIRI 11/28/2019, bevacizumab held, Udenyca Cycle 11 FOLFIRI 12/26/2019, bevacizumab held, Udenyca CTs 01/25/2020-no evidence of recurrent disease, resolution of right lower lobe nodule Maintenance Xeloda beginning 02/06/2020 CTs 04/25/2020- no evidence of metastatic disease, multiple bilateral renal calculi without hydronephrosis Maintenance  Xeloda continued CT abdomen/pelvis without contrast 08/10/2020-bilateral staghorn renal calculi, no evidence of metastatic disease; addendum 08/23/2020-small but increasing omental nodules. Cycle 1 FOLFIRI/panitumumab 09/19/2020 Cycle 2 FOLFIRI/Panitumumab 10/03/2020 Cycle 3 FOLFIRI/Panitumumab 10/17/2020 Cycle 4 FOLFIRI/panitumumab 10/31/2020  Cycle 5 FOLFIRI/Panitumumab 11/14/2020 CTs 11/26/2020-stable omental metastases compared to 10/22/2020, mildly decreased from 08/10/2020.  Left lower quadrant tiny paracolic gutter implant slightly decreased from CT 08/10/2020.  No new or progressive metastatic disease in the abdomen or pelvis. Cycle 6 FOLFIRI/Panitumumab 11/28/2020, irinotecan dose reduced, treatment schedule adjusted to every 3 weeks going forward Cycle 7 FOLFIRI/panitumumab 12/24/2020 Cycle 8 FOLFIRI/Panitumumab 01/16/2021--treatment held due to hypertension in the infusion area. Cycle 8 FOLFIRI/panitumumab 01/21/2021 Cycle 9 FOLFIRI/panitumumab 02/11/2021 Cycle 10 FOLFIRI/Panitumumab 03/04/2021 Cycle 11 FOLFIRI/Panitumumab 03/25/2021 CT abdomen/pelvis 04/10/2021-stable right omental and left posterior paracolic gutter nodules Cycle 12 FOLFIRI/panitumumab 04/15/2021 Cycle 13 FOLFIRI/panitumumab 05/06/2021 Cycle 14 FOLFIRI/Panitumumab 05/27/2021 Cycle 15 FOLFIRI/Panitumumab 06/17/2021 CT abdomen/pelvis 07/05/2021-slight enlargement of a dominant right omental implant, no new implants Patient requested a treatment break CT abdomen/pelvis 10/04/2021-mild increase in size of multiple omental soft tissue nodules, 4 mm calculus in the distal left ureter adjacent to the ureteral stent Cycle 1 Lonsurf 10/14/2021 10/28/2021 Avastin every 2 weeks Cycle 2 Lonsurf 11/11/2021 Cycle 3 Lonsurf 12/09/2021 Cycle 4 Lonsurf 01/06/2022 Avastin held 01/20/2022 due to proteinuria, 24-hour urine 43 mg CT/pelvis 01/30/2022-possible new peritoneal implant at the transverse colon, no significant change in other omental/peritoneal  implants, stable chronic rectal wall thickening, status post removal of double-J left ureteral stent with no evidence of hydronephrosis or ureteral calculus, nonobstructing bilateral renal calculi Cycle 5 Lonsurf 02/05/2022 or 02/06/2022 Avastin every 2 weeks Cycle 6 Lonsurf 03/03/2022 CTs 04/05/2022-increase in size and number of peritoneal implants compared to January 2024 central liver mass narrowing the anterior branch of the right portal vein, decreased biliary duct dilation, biliary stents in place, stable right cardiophrenic lymph nodes, separate pigtail stent in the lumen of the proximal duodenum Cycle 1 FOLFOX 04/14/2022 CT abdomen/pelvis 04/16/2022-stent within the third portion of the duodenum, stable peritoneal nodules, indwelling biliary stents with mild left biliary duct dilation Cycle 2 FOLFOX 04/28/2022, Emend and prophylactic dexamethasone added   Hypertension G4 P3, twins Kidney stones-bilateral staghorn renal calculi on CT 08/10/2020 Infected Port-A-Cath 09/12/2019-placed on Augmentin, referred for Port-A-Cath removal; Port-A-Cath removed 09/14/2019; PICC line placed 09/14/2019; culture staph aureus.  Course of Septra completed. Neutropenia secondary to chemotherapy-Udenyca added with cycle 8 FOLFIRI Right nephrostomy tube 09/12/2020; stent placed 10/09/2020, percutaneous nephrostolithotomy treatment of right-sided kidney stones 10/09/2020, malpositioned right ureter stent replacement 10/23/2020, right ureter stent removed 10/29/2020 Percutaneous left nephrostolithotomy 10/01/2021-no renal stone identified, left ureter stent left in place Cystoscopy 10/14/2021-stent removed 8.  Admission 03/21/2022 with new onset jaundice, abdominal pain, nausea MRI abdomen 03/22/2022-central liver mass with obstruction at the confluence of the intrahepatic ducts and proximal common hepatic duct with severe Intermatic biliary ductal dilatation, progressive peritoneal carcinomatosis ERCP 03/24/2022-severe biliary  stricture in the hepatic duct affecting the left and right hepatic duct, common hepatic duct, and bifurcation, malignant appearing, temporary plastic pancreatic stent, left and right hepatic duct stents were placed 9.  Admission 04/16/2022 with nausea/vomiting and abdominal pain, felt to be acute toxicity related to chemotherapy, discharged home 04/18/2022    Disposition: Ms. Delker appears stable.  She has completed 1 cycle of FOLFOX.  She developed acute onset of nausea/vomiting after the chemotherapy.  She required hospitalization for several days.  We will add Emend to the premedication regimen.  She will begin prophylactic dexamethasone 4 mg twice daily for 3 days beginning 04/29/2022.  She has Compazine and Zofran at home for as needed use.  CBC and chemistry panel reviewed.  Labs adequate to proceed as above.  She has mild hypokalemia.  She  is unsure of her home potassium dose.  She will contact us with the tablet strength and we will make an adjustment.  She has persistent upper abdominal pain.  She relates this pain to the ERCP, stent placement.  We are making a referral to GI for follow-up.  She will return for lab, follow-up, cycle 3 FOLFOX in 2 weeks.  We are available to see her sooner if needed.  Lonna Cobb ANP/GNP-BC   04/28/2022  8:55 AM

## 2022-04-29 ENCOUNTER — Other Ambulatory Visit: Payer: Self-pay

## 2022-04-29 ENCOUNTER — Telehealth: Payer: Self-pay

## 2022-04-29 NOTE — Telephone Encounter (Signed)
I spoke with Miss April [name] to inquire about her well-being. She informed me that she is feeling okay with no pain, just experiencing slight nausea. She has taken her prescribed medication for nausea and has nothing else to report. Miss April [name] also mentioned having a bowel movement late last night. I advised her to contact us if she has any concerns or complaints.

## 2022-04-30 ENCOUNTER — Inpatient Hospital Stay: Payer: 59

## 2022-04-30 VITALS — BP 169/98 | HR 76 | Temp 98.2°F | Resp 18

## 2022-04-30 DIAGNOSIS — C19 Malignant neoplasm of rectosigmoid junction: Secondary | ICD-10-CM

## 2022-04-30 DIAGNOSIS — Z5111 Encounter for antineoplastic chemotherapy: Secondary | ICD-10-CM | POA: Diagnosis not present

## 2022-04-30 DIAGNOSIS — C20 Malignant neoplasm of rectum: Secondary | ICD-10-CM

## 2022-04-30 DIAGNOSIS — Z79899 Other long term (current) drug therapy: Secondary | ICD-10-CM | POA: Diagnosis not present

## 2022-04-30 DIAGNOSIS — Z95828 Presence of other vascular implants and grafts: Secondary | ICD-10-CM

## 2022-04-30 DIAGNOSIS — C786 Secondary malignant neoplasm of retroperitoneum and peritoneum: Secondary | ICD-10-CM | POA: Diagnosis not present

## 2022-04-30 DIAGNOSIS — C187 Malignant neoplasm of sigmoid colon: Secondary | ICD-10-CM | POA: Diagnosis not present

## 2022-04-30 DIAGNOSIS — Z87891 Personal history of nicotine dependence: Secondary | ICD-10-CM | POA: Insufficient documentation

## 2022-04-30 DIAGNOSIS — Z452 Encounter for adjustment and management of vascular access device: Secondary | ICD-10-CM | POA: Diagnosis not present

## 2022-04-30 MED ORDER — SODIUM CHLORIDE 0.9% FLUSH
10.0000 mL | Freq: Once | INTRAVENOUS | Status: AC
  Administered 2022-04-30: 10 mL

## 2022-04-30 MED ORDER — HEPARIN SOD (PORK) LOCK FLUSH 100 UNIT/ML IV SOLN
500.0000 [IU] | Freq: Once | INTRAVENOUS | Status: AC
  Administered 2022-04-30: 250 [IU]

## 2022-04-30 MED ORDER — HEPARIN SOD (PORK) LOCK FLUSH 100 UNIT/ML IV SOLN
250.0000 [IU] | Freq: Once | INTRAVENOUS | Status: AC | PRN
  Administered 2022-04-30: 250 [IU]

## 2022-04-30 MED ORDER — SODIUM CHLORIDE 0.9% FLUSH
10.0000 mL | INTRAVENOUS | Status: DC | PRN
  Administered 2022-04-30: 10 mL

## 2022-04-30 NOTE — Assessment & Plan Note (Signed)
The patient has successfully ceased smoking following a recent hospitalization and has noted an improvement in respiratory function. Plan: - Support the patient's efforts to maintain smoking cessation and offer assistance as necessary.

## 2022-04-30 NOTE — Assessment & Plan Note (Signed)
Following a recent hospitalization, the patient's blood pressure medication regimen was modified, discontinuing hydrochlorothiazide and introducing hydralazine. The patient is dissatisfied with the new regimen and is finding it challenging to manage multiple medications. Plan: - Discontinue hydralazine and reinstate hydrochlorothiazide. - Transition from amlodipine and hydralazine to a combination therapy of amlodipine-valsartan. - Replace metoprolol Tartrate with metoprolol Succinate for once-daily administration. - Instruct the patient to monitor blood pressure at home and adjust medications as necessary to achieve a target blood pressure of 130/85 mmHg or lower.

## 2022-04-30 NOTE — Assessment & Plan Note (Signed)
The patient experienced a decline in potassium levels during the last hospital stay, likely attributable to vomiting and diarrhea. Plan: - Periodically check electrolyte levels, with particular attention during chemotherapy sessions, and address any detected imbalances.

## 2022-04-30 NOTE — Assessment & Plan Note (Addendum)
The patient has reported experiencing severe nausea, vomiting, and weakness following the initial round of chemotherapy resulting in inpatient hospitalization for several days. She has significant concerns with the next chemotherapy as she is unable to take care of herself at home if she gets this ill again. There is a preference for inpatient care for enhanced monitoring and support due to concerns about subsequent treatments.  The patient has noted a reduction in pain subsequent to the first chemotherapy session, resulting in a decreased need for hydrocodone for pain management. Plan: - Engage in a discussion with Dr. Truett Perna to address the patient's concerns and consider the option of inpatient care during chemotherapy. - Closely observe the patient's reaction to the upcoming round of chemotherapy and make necessary adjustments to the treatment plan. - At this time symptoms have resolved and she appears to be doing well.

## 2022-05-07 ENCOUNTER — Other Ambulatory Visit (HOSPITAL_BASED_OUTPATIENT_CLINIC_OR_DEPARTMENT_OTHER): Payer: Self-pay

## 2022-05-07 ENCOUNTER — Inpatient Hospital Stay: Payer: 59

## 2022-05-07 ENCOUNTER — Other Ambulatory Visit: Payer: Self-pay | Admitting: Nurse Practitioner

## 2022-05-07 ENCOUNTER — Telehealth: Payer: Self-pay | Admitting: Nurse Practitioner

## 2022-05-07 ENCOUNTER — Telehealth: Payer: Self-pay

## 2022-05-07 ENCOUNTER — Other Ambulatory Visit: Payer: Self-pay

## 2022-05-07 VITALS — BP 142/98 | HR 96 | Temp 98.5°F | Resp 20

## 2022-05-07 DIAGNOSIS — Z452 Encounter for adjustment and management of vascular access device: Secondary | ICD-10-CM

## 2022-05-07 DIAGNOSIS — C187 Malignant neoplasm of sigmoid colon: Secondary | ICD-10-CM | POA: Diagnosis not present

## 2022-05-07 DIAGNOSIS — C20 Malignant neoplasm of rectum: Secondary | ICD-10-CM

## 2022-05-07 DIAGNOSIS — C786 Secondary malignant neoplasm of retroperitoneum and peritoneum: Secondary | ICD-10-CM | POA: Diagnosis not present

## 2022-05-07 DIAGNOSIS — Z95828 Presence of other vascular implants and grafts: Secondary | ICD-10-CM

## 2022-05-07 DIAGNOSIS — Z5111 Encounter for antineoplastic chemotherapy: Secondary | ICD-10-CM | POA: Diagnosis not present

## 2022-05-07 DIAGNOSIS — Z79899 Other long term (current) drug therapy: Secondary | ICD-10-CM | POA: Diagnosis not present

## 2022-05-07 LAB — CBC WITH DIFFERENTIAL (CANCER CENTER ONLY)
Abs Immature Granulocytes: 0.01 10*3/uL (ref 0.00–0.07)
Basophils Absolute: 0 10*3/uL (ref 0.0–0.1)
Basophils Relative: 0 %
Eosinophils Absolute: 0.1 10*3/uL (ref 0.0–0.5)
Eosinophils Relative: 2 %
HCT: 35.7 % — ABNORMAL LOW (ref 36.0–46.0)
Hemoglobin: 12 g/dL (ref 12.0–15.0)
Immature Granulocytes: 0 %
Lymphocytes Relative: 37 %
Lymphs Abs: 2.5 10*3/uL (ref 0.7–4.0)
MCH: 31.1 pg (ref 26.0–34.0)
MCHC: 33.6 g/dL (ref 30.0–36.0)
MCV: 92.5 fL (ref 80.0–100.0)
Monocytes Absolute: 1.1 10*3/uL — ABNORMAL HIGH (ref 0.1–1.0)
Monocytes Relative: 16 %
Neutro Abs: 3.1 10*3/uL (ref 1.7–7.7)
Neutrophils Relative %: 45 %
Platelet Count: 251 10*3/uL (ref 150–400)
RBC: 3.86 MIL/uL — ABNORMAL LOW (ref 3.87–5.11)
RDW: 13.7 % (ref 11.5–15.5)
WBC Count: 6.8 10*3/uL (ref 4.0–10.5)
nRBC: 0 % (ref 0.0–0.2)

## 2022-05-07 MED ORDER — SODIUM CHLORIDE 0.9% FLUSH
10.0000 mL | Freq: Once | INTRAVENOUS | Status: AC
  Administered 2022-05-07: 10 mL

## 2022-05-07 MED ORDER — HYDROCODONE-ACETAMINOPHEN 10-325 MG PO TABS
1.0000 | ORAL_TABLET | Freq: Four times a day (QID) | ORAL | 0 refills | Status: DC | PRN
Start: 2022-05-07 — End: 2022-06-18
  Filled 2022-05-07: qty 45, 12d supply, fill #0

## 2022-05-07 MED ORDER — HEPARIN SOD (PORK) LOCK FLUSH 100 UNIT/ML IV SOLN
250.0000 [IU] | Freq: Once | INTRAVENOUS | Status: AC
  Administered 2022-05-07: 250 [IU] via INTRAVENOUS

## 2022-05-07 MED ORDER — HEPARIN SOD (PORK) LOCK FLUSH 100 UNIT/ML IV SOLN
500.0000 [IU] | Freq: Once | INTRAVENOUS | Status: AC
  Administered 2022-05-07: 250 [IU]

## 2022-05-07 MED ORDER — SODIUM CHLORIDE 0.9% FLUSH
10.0000 mL | Freq: Once | INTRAVENOUS | Status: AC
  Administered 2022-05-07: 10 mL via INTRAVENOUS

## 2022-05-07 NOTE — Patient Instructions (Signed)
PICC Home Care Guide A peripherally inserted central catheter (PICC) is a form of IV access that allows medicines and IV fluids to be quickly put into the blood and spread throughout the body. The PICC is a long, thin, flexible tube (catheter) that is put into a vein in a person's arm or leg. The catheter ends in a large vein just outside the heart called the superior vena cava (SVC). After the PICC is put in, a chest X-ray may be done to make sure that it is in the right place. A PICC may be placed for different reasons, such as: To give medicines and liquid nutrition. To give IV fluids and blood products. To take blood samples often. If there is trouble placing a peripheral intravenous (PIV) catheter. If cared for properly, a PICC can remain in place for many months. Having a PICC can allow you to go home from the hospital sooner and continue treatment at home. Medicines and PICC care can be managed at home by a family member, caregiver, or home health care team. What are the risks? Generally, having a PICC is safe. However, problems may occur, including: A blood clot (thrombus) forming in or at the end of the PICC. A blood clot forming in a vein (deep vein thrombosis) or traveling to the lung (pulmonary embolism). Inflammation of the vein (phlebitis) in which the PICC is placed. Infection at the insertion site or in the blood. Blood infections from central lines, like PICCs, can be serious and often require a hospital stay. PICC malposition, or PICC movement or poor placement. A break or cut in the PICC. Do not use scissors near the PICC. Nerve or tendon irritation or injury during PICC insertion. How to care for your PICC Please follow the specific guidelines provided by your health care provider. Preventing infection You and any caregivers should wash your hands often with soap and water for at least 20 seconds. Wash hands: Before touching the PICC or the infusion device. Before changing a  bandage (dressing). Do not change the dressing unless you have been taught to do so and have shown you are able to change it safely. Flush the PICC as told. Tell your health care provider right away if the PICC is hard to flush or does not flush. Do not use force to flush the PICC. Use clean and germ-free (sterile) supplies only. Keep the supplies in a dry place. Do not reuse needles, syringes, or any other supplies. Reusing supplies can lead to infection. Keep the PICC dressing dry and secure it with tape if the edges stop sticking to your skin. Check your PICC insertion site every day for signs of infection. Check for: Redness, swelling, or pain. Fluid or blood. Warmth. Pus or a bad smell. Preventing other problems Do not use a syringe that is less than 10 mL to flush the PICC. Do not have your blood pressure checked on the arm in which the PICC is placed. Do not ever pull or tug on the PICC. Keep it secured to your arm with tape or a stretch wrap when not in use. Do not take the PICC out yourself. Only a trained health care provider should remove the PICC. Keep pets and children away from your PICC. How to care for your PICC dressing Keep your PICC dressing clean and dry to prevent infection. Do not take baths, swim, or use a hot tub until your health care provider approves. Ask your health care provider if you can take   showers. You may only be allowed to take sponge baths. When you are allowed to shower: Ask your health care provider to teach you how to wrap the PICC. Cover the PICC with clear plastic wrap and tape to keep it dry while showering. Follow instructions from your health care provider about how to take care of your insertion site and dressing. Make sure you: Wash your hands with soap and water for at least 20 seconds before and after you change your dressing. If soap and water are not available, use hand sanitizer. Change your dressing only if taught to do so by your health care  provider. Your PICC dressing needs to be changed if it becomes loose or wet. Leave stitches (sutures), skin glue, or adhesive strips in place. These skin closures may need to stay in place for 2 weeks or longer. If adhesive strip edges start to loosen and curl up, you may trim the loose edges. Do not remove adhesive strips completely unless your health care provider tells you to do that. Follow these instructions at home: Disposal of supplies Throw away any syringes in a disposal container that is meant for sharp items (sharps container). You can buy a sharps container from a pharmacy, or you can make one by using an empty, hard plastic bottle with a lid. Place any used dressings or infusion bags into a plastic bag. Throw that bag in the trash. General instructions  Always carry your PICC identification card or wear a medical alert bracelet. Keep the tube clamped at all times, unless it is being used. Always carry a smooth-edge clamp with you to clamp the PICC if it breaks. Do not use scissors or sharp objects near the tube. You may bend your arm and move it freely. If your PICC is near or at the bend of your elbow, avoid activity with repeated motion at the elbow. Avoid lifting heavy objects as told by your health care provider. Keep all follow-up visits. This is important. You will need to have your PICC dressing changed at least once a week. Contact a health care provider if: You have pain in your arm, ear, face, or teeth. You have a fever or chills. You have redness, swelling, or pain around the insertion site. You have fluid or blood coming from the insertion site. Your insertion site feels warm to the touch. You have pus or a bad smell coming from the insertion site. Your skin feels hard and raised around the insertion site. Your PICC dressing has gotten wet or is coming off and you have not been taught how to change it. Get help right away if: You have problems with your PICC, such as  your PICC: Was tugged or pulled and has partially come out. Do not  push the PICC back in. Cannot be flushed, is hard to flush, or leaks around the insertion site when it is flushed. Makes a flushing sound when it is flushed. Appears to have a hole or tear. Is accidentally pulled all the way out. If this happens, cover the insertion site with a gauze dressing. Do not throw the PICC away. Your health care provider will need to check it to be sure the entire catheter came out. You feel your heart racing or skipping beats, or you have chest pain. You have shortness of breath or trouble breathing. You have swelling, redness, warmth, or pain in the arm in which the PICC is placed. You have a red streak going up your arm that   starts under the PICC dressing. These symptoms may be an emergency. Get help right away. Call 911. Do not wait to see if the symptoms will go away. Do not drive yourself to the hospital. Summary A peripherally inserted central catheter (PICC) is a long, thin, flexible tube (catheter) that is put into a vein in the arm or leg. If cared for properly, a PICC can remain in place for many months. Having a PICC can allow you to go home from the hospital sooner and continue treatment at home. The PICC is inserted using a germ-free (sterile) technique by a specially trained health care provider. Only a trained health care provider should remove it. Do not have your blood pressure checked on the arm in which your PICC is placed. Always keep your PICC identification card with you. This information is not intended to replace advice given to you by your health care provider. Make sure you discuss any questions you have with your health care provider. Document Revised: 07/18/2020 Document Reviewed: 07/18/2020 Elsevier Patient Education  2023 Elsevier Inc.  

## 2022-05-07 NOTE — Telephone Encounter (Signed)
Contacted April [name] to schedule their annual wellness visit. Welcome to Medicare visit Due by 06/14/22.  [name] AWV direct phone # [phone]   WTM before  06/14/22 per [place]

## 2022-05-07 NOTE — Progress Notes (Signed)
April [name] is here today for PICC line care.  She completed cycle 2 FOLFOX 04/28/2022.  She denies nausea/vomiting.  No fever.  She had a bowel movement this morning, describes as "sludge".  Prior to that bowel movement last previous bowel movement occurred on 04/28/2022.  She does not take laxatives consistently.  Hydrocodone is effective for the pain.  Vital signs are stable.  She is well-appearing.  Abdomen is soft.  Tender over the right abdomen.  No hepatomegaly.  Bowel sounds active.  CBC with hemoglobin 12.0, white count 6.8, ANC 3.1, platelet count 251,000.  We discussed the possibility that constipation is causing the abdominal pain.  She will begin lactulose 15 mL twice daily until a bowel movement then take daily, MiraLAX daily.  She agrees with this plan.  She will contact the office tomorrow morning with an update.  She understands to seek emergency evaluation if the pain becomes worse, she develops fever or has bleeding.

## 2022-05-07 NOTE — Progress Notes (Signed)
Patient was seen in infusion room for PICC line dressing change.  Patient had called into clinic earlier to state she was having possible fever-like symptoms.  Upon assessment in infusion room, patient had complaints of abdominal pain rated 10/10 that "starts on the right side and then moves to my whole stomach".  Patient stated she has had abdominal pain before but "it's worse", especially for the last 4 days.  Patient stated hydrocodone does relieve some of the pain but does not take it away.  Patient stated she is concerned the pain is related to her chemo she received last week. Patient denied nausea/vomiting and stated her last bowel movement was this morning.  Patient stated she is able to sip on apple juice and water, but her appetite has been decreased for the last 4 days.   Lonna Cobb, NP made aware and was able to see patient in infusion room (see Lonna Cobb, NP progress note).

## 2022-05-07 NOTE — Telephone Encounter (Signed)
April [name] has informed us that she is feeling unwell and may be experiencing a fever. She has requested to have her blood count checked to ensure it is stable. In accordance with Dr. Kalman Drape instructions, I will schedule a lab appointment for a complete blood count (CBC) and arrange for the infusion nurse to monitor her vital signs. I will communicate with the infusion nurse to obtain the necessary information and provide updates as needed.

## 2022-05-08 ENCOUNTER — Ambulatory Visit: Payer: Self-pay

## 2022-05-08 ENCOUNTER — Telehealth: Payer: Self-pay

## 2022-05-08 DIAGNOSIS — K831 Obstruction of bile duct: Secondary | ICD-10-CM

## 2022-05-08 DIAGNOSIS — T85528A Displacement of other gastrointestinal prosthetic devices, implants and grafts, initial encounter: Secondary | ICD-10-CM

## 2022-05-08 NOTE — Telephone Encounter (Signed)
KUB has been entered  Appt made to see Colleen on 06/13/22 at 130 pm   Left message on machine to call back

## 2022-05-08 NOTE — Telephone Encounter (Signed)
Mansouraty, Netty Starring., MD  Loretha Stapler, RN Get repeat KUB to ensure PD stent has fallen out. Can see any of the APPs or myself. Thanks. GM       Previous Messages    ----- Message ----- From: Loretha Stapler, RN Sent: 05/07/2022   1:55 PM EDT To: Lemar Lofty., MD Subject: FW: Referral                                  Dr Meridee Score please see referral and advise ----- Message ----- From: Serina Cowper Sent: 05/07/2022   1:35 PM EDT To: Loretha Stapler, RN Subject: Referral                                      Please review. Thanks     Referral Referral # [id number] Referral Information  Referral # Creation Date Referral Status Status Update   [id number] 04/28/2022 Pending Review 04/28/2022: Status History     Status Reason

## 2022-05-08 NOTE — Patient Outreach (Signed)
  Care Coordination   05/08/2022 Name: April [name] MRN: [id number] DOB: 1975/05/10   Care Coordination Outreach Attempts:  An unsuccessful telephone outreach was attempted for a scheduled appointment today.  Follow Up Plan:  Additional outreach attempts will be made to offer the patient care coordination information and services.   Encounter Outcome:  No Answer   Care Coordination Interventions:  No, not indicated    Delsa Sale, RN, BSN, CCM Care Management Coordinator [facility] Care Management Direct Phone: [phone]

## 2022-05-09 ENCOUNTER — Other Ambulatory Visit: Payer: Self-pay

## 2022-05-09 NOTE — Telephone Encounter (Signed)
The pt has been advised of the appt information and also will come in and have KUB next week. Order entered

## 2022-05-11 ENCOUNTER — Other Ambulatory Visit: Payer: Self-pay | Admitting: Oncology

## 2022-05-11 DIAGNOSIS — C19 Malignant neoplasm of rectosigmoid junction: Secondary | ICD-10-CM

## 2022-05-12 ENCOUNTER — Other Ambulatory Visit (HOSPITAL_BASED_OUTPATIENT_CLINIC_OR_DEPARTMENT_OTHER): Payer: Self-pay

## 2022-05-12 ENCOUNTER — Inpatient Hospital Stay: Payer: 59

## 2022-05-12 ENCOUNTER — Inpatient Hospital Stay (HOSPITAL_BASED_OUTPATIENT_CLINIC_OR_DEPARTMENT_OTHER): Payer: 59 | Admitting: Nurse Practitioner

## 2022-05-12 ENCOUNTER — Encounter: Payer: Self-pay | Admitting: Nurse Practitioner

## 2022-05-12 VITALS — BP 130/86 | HR 65

## 2022-05-12 VITALS — BP 142/80 | HR 89 | Temp 98.1°F | Resp 18 | Ht 64.0 in | Wt 145.0 lb

## 2022-05-12 DIAGNOSIS — C19 Malignant neoplasm of rectosigmoid junction: Secondary | ICD-10-CM | POA: Diagnosis not present

## 2022-05-12 DIAGNOSIS — C786 Secondary malignant neoplasm of retroperitoneum and peritoneum: Secondary | ICD-10-CM | POA: Diagnosis not present

## 2022-05-12 DIAGNOSIS — Z452 Encounter for adjustment and management of vascular access device: Secondary | ICD-10-CM | POA: Diagnosis not present

## 2022-05-12 DIAGNOSIS — Z5111 Encounter for antineoplastic chemotherapy: Secondary | ICD-10-CM | POA: Diagnosis not present

## 2022-05-12 DIAGNOSIS — Z79899 Other long term (current) drug therapy: Secondary | ICD-10-CM | POA: Diagnosis not present

## 2022-05-12 DIAGNOSIS — C187 Malignant neoplasm of sigmoid colon: Secondary | ICD-10-CM | POA: Diagnosis not present

## 2022-05-12 DIAGNOSIS — C20 Malignant neoplasm of rectum: Secondary | ICD-10-CM | POA: Diagnosis not present

## 2022-05-12 LAB — CBC WITH DIFFERENTIAL (CANCER CENTER ONLY)
Abs Immature Granulocytes: 0 10*3/uL (ref 0.00–0.07)
Basophils Absolute: 0 10*3/uL (ref 0.0–0.1)
Basophils Relative: 1 %
Eosinophils Absolute: 0.2 10*3/uL (ref 0.0–0.5)
Eosinophils Relative: 3 %
HCT: 35.6 % — ABNORMAL LOW (ref 36.0–46.0)
Hemoglobin: 11.8 g/dL — ABNORMAL LOW (ref 12.0–15.0)
Immature Granulocytes: 0 %
Lymphocytes Relative: 37 %
Lymphs Abs: 1.8 10*3/uL (ref 0.7–4.0)
MCH: 30.8 pg (ref 26.0–34.0)
MCHC: 33.1 g/dL (ref 30.0–36.0)
MCV: 93 fL (ref 80.0–100.0)
Monocytes Absolute: 0.6 10*3/uL (ref 0.1–1.0)
Monocytes Relative: 14 %
Neutro Abs: 2.1 10*3/uL (ref 1.7–7.7)
Neutrophils Relative %: 45 %
Platelet Count: 235 10*3/uL (ref 150–400)
RBC: 3.83 MIL/uL — ABNORMAL LOW (ref 3.87–5.11)
RDW: 14 % (ref 11.5–15.5)
WBC Count: 4.7 10*3/uL (ref 4.0–10.5)
nRBC: 0 % (ref 0.0–0.2)

## 2022-05-12 LAB — CMP (CANCER CENTER ONLY)
ALT: 107 U/L — ABNORMAL HIGH (ref 0–44)
AST: 78 U/L — ABNORMAL HIGH (ref 15–41)
Albumin: 3.8 g/dL (ref 3.5–5.0)
Alkaline Phosphatase: 408 U/L — ABNORMAL HIGH (ref 38–126)
Anion gap: 9 (ref 5–15)
BUN: 5 mg/dL — ABNORMAL LOW (ref 6–20)
CO2: 32 mmol/L (ref 22–32)
Calcium: 9.4 mg/dL (ref 8.9–10.3)
Chloride: 99 mmol/L (ref 98–111)
Creatinine: 0.5 mg/dL (ref 0.44–1.00)
GFR, Estimated: >60 mL/min (ref >60)
Glucose, Bld: 94 mg/dL (ref 70–99)
Potassium: 3.2 mmol/L — ABNORMAL LOW (ref 3.5–5.1)
Sodium: 140 mmol/L (ref 135–145)
Total Bilirubin: 0.7 mg/dL (ref 0.3–1.2)
Total Protein: 7.1 g/dL (ref 6.5–8.1)

## 2022-05-12 MED ORDER — DEXTROSE 5 % IV SOLN: Freq: Once | INTRAVENOUS | Status: AC

## 2022-05-12 MED ORDER — SODIUM CHLORIDE 0.9 % IV SOLN
1800.0000 mg/m2 | INTRAVENOUS | Status: DC
  Administered 2022-05-12: 3500 mg via INTRAVENOUS
  Filled 2022-05-12: qty 70

## 2022-05-12 MED ORDER — SODIUM CHLORIDE 0.9 % IV SOLN
150.0000 mg | Freq: Once | INTRAVENOUS | Status: AC
  Administered 2022-05-12: 150 mg via INTRAVENOUS
  Filled 2022-05-12: qty 150

## 2022-05-12 MED ORDER — LORAZEPAM 0.5 MG PO TABS
0.5000 mg | ORAL_TABLET | Freq: Three times a day (TID) | ORAL | 0 refills | Status: DC | PRN
Start: 2022-05-12 — End: 2022-07-01
  Filled 2022-05-12: qty 30, 10d supply, fill #0

## 2022-05-12 MED ORDER — FLUOROURACIL CHEMO INJECTION 2.5 GM/50ML
400.0000 mg/m2 | Freq: Once | INTRAVENOUS | Status: AC
  Administered 2022-05-12: 700 mg via INTRAVENOUS
  Filled 2022-05-12: qty 14

## 2022-05-12 MED ORDER — ALTEPLASE 2 MG IJ SOLR
2.0000 mg | Freq: Once | INTRAMUSCULAR | Status: AC
  Administered 2022-05-12: 2 mg
  Filled 2022-05-12: qty 2

## 2022-05-12 MED ORDER — LEUCOVORIN CALCIUM INJECTION 350 MG
400.0000 mg/m2 | Freq: Once | INTRAVENOUS | Status: AC
  Administered 2022-05-12: 704 mg via INTRAVENOUS
  Filled 2022-05-12: qty 35.2

## 2022-05-12 MED ORDER — SODIUM CHLORIDE 0.9 % IV SOLN
10.0000 mg | Freq: Once | INTRAVENOUS | Status: AC
  Administered 2022-05-12: 10 mg via INTRAVENOUS
  Filled 2022-05-12: qty 10

## 2022-05-12 MED ORDER — PALONOSETRON HCL INJECTION 0.25 MG/5ML
0.2500 mg | Freq: Once | INTRAVENOUS | Status: AC
  Administered 2022-05-12: 0.25 mg via INTRAVENOUS
  Filled 2022-05-12: qty 5

## 2022-05-12 MED ORDER — OXALIPLATIN CHEMO INJECTION 100 MG/20ML
85.0000 mg/m2 | Freq: Once | INTRAVENOUS | Status: AC
  Administered 2022-05-12: 150 mg via INTRAVENOUS
  Filled 2022-05-12: qty 30

## 2022-05-12 NOTE — Progress Notes (Signed)
[facility] OFFICE PROGRESS NOTE   Diagnosis: Colon cancer  INTERVAL HISTORY:   April [name] returns as scheduled.  She completed cycle 2 FOLFOX 04/28/2022.  She again developed nausea the evening of day 1.  The nausea persisted for 10 days.  No vomiting.  Bowels have been moving regularly for the past few days.  No mouth sores.  Cold sensitivity lasted 4 to 5 days.  She has persistent intermittent pain at the right abdomen.  Objective:  Vital signs in last 24 hours:  Blood pressure (!) 142/80, pulse 89, temperature 98.1 F (36.7 C), temperature source Oral, resp. rate 18, height 5\' 4"  (1.626 m), weight 145 lb (65.8 kg), last menstrual period 01/07/2006, SpO2 100 %.    HEENT: No thrush or ulcers. Resp: Lungs clear bilaterally. Cardio: Regular rate and rhythm. GI: Abdomen is soft.  No hepatomegaly.  Tender over the right abdomen. Vascular: No leg edema.  PICC line without erythema.  Lab Results:  Lab Results  Component Value Date   WBC 4.7 05/12/2022   HGB 11.8 (L) 05/12/2022   HCT 35.6 (L) 05/12/2022   MCV 93.0 05/12/2022   PLT 235 05/12/2022   NEUTROABS 2.1 05/12/2022    Imaging:  No results found.  Medications: I have reviewed the patient's current medications.  Assessment/Plan: Sigmoid colon cancer, stage IV (pT4a,pN2b,M1c) Colonoscopy 03/24/2019-3 rectal polyps-hyperplastic polyps, distal colon biopsy-at least intramucosal adenocarcinoma, completely obstructing mass in the distal sigmoid colon, could not be traversed, intact mismatch repair protein expression 03/24/2019-CEA 56.1 03/29/2019 CT abdomen/pelvis-circumferential thickening involving the entire mid and distal sigmoid colon to the level of the rectosigmoid junction, solid/cystic mass in the left ovary, bilateral nephrolithiasis Robotic assisted low anterior resection, mesenteric lymphadenectomy, bilateral salpingo-oophorectomy 04/08/2019 Pathology ([facility] review of outside pathology)  metastatic adenocarcinoma involving the peritoneum overlying the round ligament, serosa of the urinary bladder, serosa of the right ureter a sacral area, pelvic peritoneum, and left ovary.  Omental biopsy with focal mucin pools with no carcinoma cells identified, right hemidiaphragm biopsy involved by metastatic adenocarcinoma, invasive adenocarcinoma the sigmoid colon, moderately differentiated, T4a, perineural and vascular invasion present, 7/22 lymph nodes, multiple tumor deposits, resection margins negative Negative for PD-L1, low probability of MSI-high, HER-2 negative, negative for BRAF, NRAS and KRAS alterations CTs 05/19/2019-no evidence of metastatic disease, findings suspicious for colitis of the transverse and ascending colon, small amount of ascites in the cul-de-sac, bilateral renal calculi Cycle 1 FOLFIRI 05/24/2019, bevacizumab added with cycle 2 Cycle 4 FOLFIRI/bevacizumab 07/05/2019 Cycle 5 FOLFIRI 07/27/2019, bevacizumab held secondary to hypertension Cycle 6 FOLFIRI 08/10/1999, bevacizumab held secondary to hypertension CTs 08/30/2019-no evidence of recurrent disease, new subsolid right lower lobe nodule felt to be inflammatory, emphysema Cycle 7 FOLFIRI 09/26/2019, bevacizumab remains on hold secondary to hypertension Cycle 8 FOLFIRI 10/17/2019, bevacizumab held secondary to hypertension, Udenyca added for neutropenia Cycle 9 FOLFIRI 11/07/2019, bevacizumab held secondary to hypertension, Udenyca Cycle 10 FOLFIRI 11/28/2019, bevacizumab held, Udenyca Cycle 11 FOLFIRI 12/26/2019, bevacizumab held, Udenyca CTs 01/25/2020-no evidence of recurrent disease, resolution of right lower lobe nodule Maintenance Xeloda beginning 02/06/2020 CTs 04/25/2020- no evidence of metastatic disease, multiple bilateral renal calculi without hydronephrosis Maintenance Xeloda continued CT abdomen/pelvis without contrast 08/10/2020-bilateral staghorn renal calculi, no evidence of metastatic disease; addendum  08/23/2020-small but increasing omental nodules. Cycle 1 FOLFIRI/panitumumab 09/19/2020 Cycle 2 FOLFIRI/Panitumumab 10/03/2020 Cycle 3 FOLFIRI/Panitumumab 10/17/2020 Cycle 4 FOLFIRI/panitumumab 10/31/2020 Cycle 5 FOLFIRI/Panitumumab 11/14/2020 CTs 11/26/2020-stable omental metastases compared to 10/22/2020, mildly decreased from 08/10/2020.  Left lower quadrant tiny  paracolic gutter implant slightly decreased from CT 08/10/2020.  No new or progressive metastatic disease in the abdomen or pelvis. Cycle 6 FOLFIRI/Panitumumab 11/28/2020, irinotecan dose reduced, treatment schedule adjusted to every 3 weeks going forward Cycle 7 FOLFIRI/panitumumab 12/24/2020 Cycle 8 FOLFIRI/Panitumumab 01/16/2021--treatment held due to hypertension in the infusion area. Cycle 8 FOLFIRI/panitumumab 01/21/2021 Cycle 9 FOLFIRI/panitumumab 02/11/2021 Cycle 10 FOLFIRI/Panitumumab 03/04/2021 Cycle 11 FOLFIRI/Panitumumab 03/25/2021 CT abdomen/pelvis 04/10/2021-stable right omental and left posterior paracolic gutter nodules Cycle 12 FOLFIRI/panitumumab 04/15/2021 Cycle 13 FOLFIRI/panitumumab 05/06/2021 Cycle 14 FOLFIRI/Panitumumab 05/27/2021 Cycle 15 FOLFIRI/Panitumumab 06/17/2021 CT abdomen/pelvis 07/05/2021-slight enlargement of a dominant right omental implant, no new implants Patient requested a treatment break CT abdomen/pelvis 10/04/2021-mild increase in size of multiple omental soft tissue nodules, 4 mm calculus in the distal left ureter adjacent to the ureteral stent Cycle 1 Lonsurf 10/14/2021 10/28/2021 Avastin every 2 weeks Cycle 2 Lonsurf 11/11/2021 Cycle 3 Lonsurf 12/09/2021 Cycle 4 Lonsurf 01/06/2022 Avastin held 01/20/2022 due to proteinuria, 24-hour urine 43 mg CT/pelvis 01/30/2022-possible new peritoneal implant at the transverse colon, no significant change in other omental/peritoneal implants, stable chronic rectal wall thickening, status post removal of double-J left ureteral stent with no evidence of hydronephrosis or  ureteral calculus, nonobstructing bilateral renal calculi Cycle 5 Lonsurf 02/05/2022 or 02/06/2022 Avastin every 2 weeks Cycle 6 Lonsurf 03/03/2022 CTs 04/05/2022-increase in size and number of peritoneal implants compared to January 2024 central liver mass narrowing the anterior branch of the right portal vein, decreased biliary duct dilation, biliary stents in place, stable right cardiophrenic lymph nodes, separate pigtail stent in the lumen of the proximal duodenum Cycle 1 FOLFOX 04/14/2022 CT abdomen/pelvis 04/16/2022-stent within the third portion of the duodenum, stable peritoneal nodules, indwelling biliary stents with mild left biliary duct dilation Cycle 2 FOLFOX 04/28/2022, Emend and prophylactic dexamethasone added Cycle 3 FOLFOX 05/12/2022, Aloxi, Emend, prophylactic dexamethasone, Compazine and lorazepam as needed   Hypertension G4 P3, twins Kidney stones-bilateral staghorn renal calculi on CT 08/10/2020 Infected Port-A-Cath 09/12/2019-placed on Augmentin, referred for Port-A-Cath removal; Port-A-Cath removed 09/14/2019; PICC line placed 09/14/2019; culture staph aureus.  Course of Septra completed. Neutropenia secondary to chemotherapy-Udenyca added with cycle 8 FOLFIRI Right nephrostomy tube 09/12/2020; stent placed 10/09/2020, percutaneous nephrostolithotomy treatment of right-sided kidney stones 10/09/2020, malpositioned right ureter stent replacement 10/23/2020, right ureter stent removed 10/29/2020 Percutaneous left nephrostolithotomy 10/01/2021-no renal stone identified, left ureter stent left in place Cystoscopy 10/14/2021-stent removed 8.  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  Admission 04/16/2022 with nausea/vomiting and abdominal pain, felt to be acute toxicity related to chemotherapy, discharged home 04/18/2022  Disposition: April [name] appears stable.  She has completed 2 cycles of FOLFOX.  Postchemotherapy course has been complicated by delayed nausea.  She is receiving Aloxi, Emend, Decadron as premedications.  She takes dexamethasone for 3 days beginning day 2.  She takes Compazine as needed.  We discussed Zyprexa, lorazepam.  She would like to try lorazepam with this cycle.  We discussed various potential etiologies of the abdominal pain including constipation, cancer, stent.  Per GI she is scheduled to have a KUB this week and has also been scheduled follow-up.  She will continue laxatives.  Hydrocodone is effective for the pain, she will continue as needed.  CBC and chemistry panel reviewed.  Labs adequate to proceed as above.  Transaminases are elevated.  This may be related to oxaliplatin.  We will continue to monitor.  She will return for follow-up in 2 weeks.  She will contact the office in the interim with poorly controlled nausea.  Patient seen with Dr. Truett Perna.    Lonna Cobb ANP/GNP-BC   05/12/2022  11:04 AM   This was a shared visit with Lonna Cobb.  April [name] was interviewed and examined.  The prolonged nausea following chemotherapy is most likely secondary to chemotherapy as opposed to carcinomatosis.  We adjusted the antiemetic regimen today.  The abdominal pain may be related to carcinomatosis.  I doubt the pain is due to the pancreas duct stent.  She is scheduled to follow-up with GI later this week.  The plan is to schedule a restaging abdomen/pelvis CT after 5 cycles of FOLFOX.  I was present for greater than 50% of today's visit.  I performed medical stage  making.  Mancel Bale, MD

## 2022-05-12 NOTE — Progress Notes (Signed)
Patient seen by Lonna Cobb, NP and Dr. Thornton Papas today  Vitals are within treatment parameters.   Labs reviewed by Lonna Cobb, NP and are NOT within treatment parameters, ALT 107. Per Dr. Truett Perna, ok to proceed with treatment today.  Per physician team, patient is ready for treatment and there are NO modifications to the treatment plan.

## 2022-05-12 NOTE — Patient Instructions (Addendum)
[facility]   The chemotherapy medication bag should finish at 46 hours, 96 hours, or 7 days. For example, if your pump is scheduled for 46 hours and it was put on at 4:00 p.m., it should finish at 2:00 p.m. the day it is scheduled to come off regardless of your appointment time.     Estimated time to finish at 1:45 Wednesday, May 14, 2022.   If the display on your pump reads "Low Volume" and it is beeping, take the batteries out of the pump and come to the cancer center for it to be taken off.   If the pump alarms go off prior to the pump reading "Low Volume" then call [phone] and someone can assist you.  If the plunger comes out and the chemotherapy medication is leaking out, please use your home chemo spill kit to clean up the spill. Do NOT use paper towels or other household products.  If you have problems or questions regarding your pump, please call either [phone] (24 hours a day) or the cancer center Monday-Friday 8:00 a.m.- 4:30 p.m. at the clinic number and we will assist you. If you are unable to get assistance, then go to the nearest Emergency Department and ask the staff to contact the IV team for assistance.  Discharge Instructions: Thank you for choosing [facility] to provide your oncology and hematology care.   If you have a lab appointment with the Cancer Center, please go directly to the Cancer Center and check in at the registration area.   Wear comfortable clothing and clothing appropriate for easy access to any Portacath or PICC line.   We strive to give you quality time with your provider. You may need to reschedule your appointment if you arrive late (15 or more minutes).  Arriving late affects you and other patients whose appointments are after yours.  Also, if you miss three or more appointments without notifying the office, you may be dismissed from the clinic at the provider's discretion.      For  prescription refill requests, have your pharmacy contact our office and allow 72 hours for refills to be completed.    Today you received the following chemotherapy and/or immunotherapy agents Oxaliplatin, Leucovorin, Fluorouracil.      To help prevent nausea and vomiting after your treatment, we encourage you to take your nausea medication as directed.  BELOW ARE SYMPTOMS THAT SHOULD BE REPORTED IMMEDIATELY: *FEVER GREATER THAN 100.4 F (38 C) OR HIGHER *CHILLS OR SWEATING *NAUSEA AND VOMITING THAT IS NOT CONTROLLED WITH YOUR NAUSEA MEDICATION *UNUSUAL SHORTNESS OF BREATH *UNUSUAL BRUISING OR BLEEDING *URINARY PROBLEMS (pain or burning when urinating, or frequent urination) *BOWEL PROBLEMS (unusual diarrhea, constipation, pain near the anus) TENDERNESS IN MOUTH AND THROAT WITH OR WITHOUT PRESENCE OF ULCERS (sore throat, sores in mouth, or a toothache) UNUSUAL RASH, SWELLING OR PAIN  UNUSUAL VAGINAL DISCHARGE OR ITCHING   Items with * indicate a potential emergency and should be followed up as soon as possible or go to the Emergency Department if any problems should occur.  Please show the CHEMOTHERAPY ALERT CARD or IMMUNOTHERAPY ALERT CARD at check-in to the Emergency Department and triage nurse.  Should you have questions after your visit or need to cancel or reschedule your appointment, please contact [facility]  Dept: [phone]  and follow the prompts.  Office hours are 8:00 a.m. to 4:30 p.m. Monday - Friday. Please note  that voicemails left after 4:00 p.m. may not be returned until the following business day.  We are closed weekends and major holidays. You have access to a nurse at all times for urgent questions. Please call the main number to the clinic Dept: [phone] and follow the prompts.   For any non-urgent questions, you may also contact your provider using MyChart. We now offer e-Visits for anyone 43 and older to request care online  for non-urgent symptoms. For details visit [email or web address].   Also download the MyChart app! Go to the app store, search "MyChart", open the app, select [place], and log in with your MyChart username and password.  Oxaliplatin Injection What is this medication? OXALIPLATIN (ox AL i PLA tin) treats colorectal cancer. It works by slowing down the growth of cancer cells. This medicine may be used for other purposes; ask your health care provider or pharmacist if you have questions. COMMON BRAND NAME(S): Eloxatin What should I tell my care team before I take this medication? They need to know if you have any of these conditions: Heart disease History of irregular heartbeat or rhythm Liver disease Low blood cell levels (white cells, red cells, and platelets) Lung or breathing disease, such as asthma Take medications that treat or prevent blood clots Tingling of the fingers, toes, or other nerve disorder An unusual or allergic reaction to oxaliplatin, other medications, foods, dyes, or preservatives If you or your partner are pregnant or trying to get pregnant Breast-feeding How should I use this medication? This medication is injected into a vein. It is given by your care team in a hospital or clinic setting. Talk to your care team about the use of this medication in children. Special care may be needed. Overdosage: If you think you have taken too much of this medicine contact a poison control center or emergency room at once. NOTE: This medicine is only for you. Do not share this medicine with others. What if I miss a dose? Keep appointments for follow-up doses. It is important not to miss a dose. Call your care team if you are unable to keep an appointment. What may interact with this medication? Do not take this medication with any of the following: Cisapride Dronedarone Pimozide Thioridazine This medication may also interact with the following: Aspirin and aspirin-like  medications Certain medications that treat or prevent blood clots, such as warfarin, apixaban, dabigatran, and rivaroxaban Cisplatin Cyclosporine Diuretics Medications for infection, such as acyclovir, adefovir, amphotericin B, bacitracin, cidofovir, foscarnet, ganciclovir, gentamicin, pentamidine, vancomycin NSAIDs, medications for pain and inflammation, such as ibuprofen or naproxen Other medications that cause heart rhythm changes Pamidronate Zoledronic acid This list may not describe all possible interactions. Give your health care provider a list of all the medicines, herbs, non-prescription drugs, or dietary supplements you use. Also tell them if you smoke, drink alcohol, or use illegal drugs. Some items may interact with your medicine. What should I watch for while using this medication? Your condition will be monitored carefully while you are receiving this medication. You may need blood work while taking this medication. This medication may make you feel generally unwell. This is not uncommon as chemotherapy can affect healthy cells as well as cancer cells. Report any side effects. Continue your course of treatment even though you feel ill unless your care team tells you to stop. This medication may increase your risk of getting an infection. Call your care team for advice if you get a fever, chills, sore throat, or  other symptoms of a cold or flu. Do not treat yourself. Try to avoid being around people who are sick. Avoid taking medications that contain aspirin, acetaminophen, ibuprofen, naproxen, or ketoprofen unless instructed by your care team. These medications may hide a fever. Be careful brushing or flossing your teeth or using a toothpick because you may get an infection or bleed more easily. If you have any dental work done, tell your dentist you are receiving this medication. This medication can make you more sensitive to cold. Do not drink cold drinks or use ice. Cover exposed  skin before coming in contact with cold temperatures or cold objects. When out in cold weather wear warm clothing and cover your mouth and nose to warm the air that goes into your lungs. Tell your care team if you get sensitive to the cold. Talk to your care team if you or your partner are pregnant or think either of you might be pregnant. This medication can cause serious birth defects if taken during pregnancy and for 9 months after the last dose. A negative pregnancy test is required before starting this medication. A reliable form of contraception is recommended while taking this medication and for 9 months after the last dose. Talk to your care team about effective forms of contraception. Do not father a child while taking this medication and for 6 months after the last dose. Use a condom while having sex during this time period. Do not breastfeed while taking this medication and for 3 months after the last dose. This medication may cause infertility. Talk to your care team if you are concerned about your fertility. What side effects may I notice from receiving this medication? Side effects that you should report to your care team as soon as possible: Allergic reactions--skin rash, itching, hives, swelling of the face, lips, tongue, or throat Bleeding--bloody or black, tar-like stools, vomiting blood or brown material that looks like coffee grounds, red or dark brown urine, small red or purple spots on skin, unusual bruising or bleeding Dry cough, shortness of breath or trouble breathing Heart rhythm changes--fast or irregular heartbeat, dizziness, feeling faint or lightheaded, chest pain, trouble breathing Infection--fever, chills, cough, sore throat, wounds that don't heal, pain or trouble when passing urine, general feeling of discomfort or being unwell Liver injury--right upper belly pain, loss of appetite, nausea, light-colored stool, dark yellow or brown urine, yellowing skin or eyes, unusual  weakness or fatigue Low red blood cell level--unusual weakness or fatigue, dizziness, headache, trouble breathing Muscle injury--unusual weakness or fatigue, muscle pain, dark yellow or brown urine, decrease in amount of urine Pain, tingling, or numbness in the hands or feet Sudden and severe headache, confusion, change in vision, seizures, which may be signs of posterior reversible encephalopathy syndrome (PRES) Unusual bruising or bleeding Side effects that usually do not require medical attention (report to your care team if they continue or are bothersome): Diarrhea Nausea Pain, redness, or swelling with sores inside the mouth or throat Unusual weakness or fatigue Vomiting This list may not describe all possible side effects. Call your doctor for medical advice about side effects. You may report side effects to FDA at 1-800-FDA-1088. Where should I keep my medication? This medication is given in a hospital or clinic. It will not be stored at home. NOTE: This sheet is a summary. It may not cover all possible information. If you have questions about this medicine, talk to your doctor, pharmacist, or health care provider.  2023 Elsevier/Gold Standard (  2007-02-20 00:00:00)  

## 2022-05-13 ENCOUNTER — Telehealth: Payer: Self-pay

## 2022-05-13 ENCOUNTER — Telehealth: Payer: Self-pay | Admitting: *Deleted

## 2022-05-13 NOTE — Telephone Encounter (Signed)
The patient has informed me that she is not currently adhering to the prescribed regimen for potassium intake. I have counseled the patient on the importance of following the prescribed treatment plan and encouraged her to resume taking potassium as directed in order to address her low potassium levels effectively.

## 2022-05-13 NOTE — Telephone Encounter (Signed)
-----   Message from Rana Snare, NP sent at 05/12/2022  4:47 PM EDT ----- Please call her-is she taking potassium?  What dose/schedule?

## 2022-05-13 NOTE — Progress Notes (Signed)
  Care Coordination Note  05/13/2022 Name: April [name] MRN: [id number] DOB: 06-18-1975  [name] is a 47 y.o. year old female who is a primary care patient of Early, Sung Amabile, NP and is actively engaged with the care management team. I reached out to [name] by phone today to assist with re-scheduling a follow up visit with the RN Case Manager  Follow up plan: Patient declines further follow up and engagement by the care management team. Appropriate care team members and provider have been notified via electronic communication.  Patient will follow up with PCP and oncologist if she has any concerns.   [facility]  Care Coordination Care Guide  Direct Dial: [phone]

## 2022-05-14 ENCOUNTER — Inpatient Hospital Stay: Payer: 59 | Attending: Oncology

## 2022-05-14 VITALS — BP 156/86 | HR 65 | Temp 98.2°F | Resp 18

## 2022-05-14 DIAGNOSIS — Z79899 Other long term (current) drug therapy: Secondary | ICD-10-CM | POA: Diagnosis not present

## 2022-05-14 DIAGNOSIS — Z452 Encounter for adjustment and management of vascular access device: Secondary | ICD-10-CM

## 2022-05-14 DIAGNOSIS — C786 Secondary malignant neoplasm of retroperitoneum and peritoneum: Secondary | ICD-10-CM | POA: Diagnosis not present

## 2022-05-14 DIAGNOSIS — C20 Malignant neoplasm of rectum: Secondary | ICD-10-CM | POA: Diagnosis not present

## 2022-05-14 DIAGNOSIS — C7951 Secondary malignant neoplasm of bone: Secondary | ICD-10-CM | POA: Diagnosis not present

## 2022-05-14 DIAGNOSIS — C19 Malignant neoplasm of rectosigmoid junction: Secondary | ICD-10-CM

## 2022-05-14 DIAGNOSIS — Z5111 Encounter for antineoplastic chemotherapy: Secondary | ICD-10-CM | POA: Insufficient documentation

## 2022-05-14 DIAGNOSIS — C187 Malignant neoplasm of sigmoid colon: Secondary | ICD-10-CM | POA: Insufficient documentation

## 2022-05-14 MED ORDER — SODIUM CHLORIDE 0.9% FLUSH
10.0000 mL | Freq: Once | INTRAVENOUS | Status: AC
  Administered 2022-05-14: 10 mL via INTRAVENOUS

## 2022-05-14 MED ORDER — HEPARIN SOD (PORK) LOCK FLUSH 100 UNIT/ML IV SOLN
250.0000 [IU] | Freq: Once | INTRAVENOUS | Status: AC | PRN
  Administered 2022-05-14: 250 [IU]

## 2022-05-14 MED ORDER — HEPARIN SOD (PORK) LOCK FLUSH 100 UNIT/ML IV SOLN
250.0000 [IU] | Freq: Once | INTRAVENOUS | Status: AC
  Administered 2022-05-14: 250 [IU] via INTRAVENOUS

## 2022-05-14 MED ORDER — SODIUM CHLORIDE 0.9% FLUSH
10.0000 mL | INTRAVENOUS | Status: DC | PRN
  Administered 2022-05-14: 10 mL

## 2022-05-14 NOTE — Patient Instructions (Signed)
PICC Home Care Guide A peripherally inserted central catheter (PICC) is a form of IV access that allows medicines and IV fluids to be quickly put into the blood and spread throughout the body. The PICC is a long, thin, flexible tube (catheter) that is put into a vein in a person's arm or leg. The catheter ends in a large vein just outside the heart called the superior vena cava (SVC). After the PICC is put in, a chest X-ray may be done to make sure that it is in the right place. A PICC may be placed for different reasons, such as: To give medicines and liquid nutrition. To give IV fluids and blood products. To take blood samples often. If there is trouble placing a peripheral intravenous (PIV) catheter. If cared for properly, a PICC can remain in place for many months. Having a PICC can allow you to go home from the hospital sooner and continue treatment at home. Medicines and PICC care can be managed at home by a family member, caregiver, or home health care team. What are the risks? Generally, having a PICC is safe. However, problems may occur, including: A blood clot (thrombus) forming in or at the end of the PICC. A blood clot forming in a vein (deep vein thrombosis) or traveling to the lung (pulmonary embolism). Inflammation of the vein (phlebitis) in which the PICC is placed. Infection at the insertion site or in the blood. Blood infections from central lines, like PICCs, can be serious and often require a hospital stay. PICC malposition, or PICC movement or poor placement. A break or cut in the PICC. Do not use scissors near the PICC. Nerve or tendon irritation or injury during PICC insertion. How to care for your PICC Please follow the specific guidelines provided by your health care provider. Preventing infection You and any caregivers should wash your hands often with soap and water for at least 20 seconds. Wash hands: Before touching the PICC or the infusion device. Before changing a  bandage (dressing). Do not change the dressing unless you have been taught to do so and have shown you are able to change it safely. Flush the PICC as told. Tell your health care provider right away if the PICC is hard to flush or does not flush. Do not use force to flush the PICC. Use clean and germ-free (sterile) supplies only. Keep the supplies in a dry place. Do not reuse needles, syringes, or any other supplies. Reusing supplies can lead to infection. Keep the PICC dressing dry and secure it with tape if the edges stop sticking to your skin. Check your PICC insertion site every day for signs of infection. Check for: Redness, swelling, or pain. Fluid or blood. Warmth. Pus or a bad smell. Preventing other problems Do not use a syringe that is less than 10 mL to flush the PICC. Do not have your blood pressure checked on the arm in which the PICC is placed. Do not ever pull or tug on the PICC. Keep it secured to your arm with tape or a stretch wrap when not in use. Do not take the PICC out yourself. Only a trained health care provider should remove the PICC. Keep pets and children away from your PICC. How to care for your PICC dressing Keep your PICC dressing clean and dry to prevent infection. Do not take baths, swim, or use a hot tub until your health care provider approves. Ask your health care provider if you can take   showers. You may only be allowed to take sponge baths. When you are allowed to shower: Ask your health care provider to teach you how to wrap the PICC. Cover the PICC with clear plastic wrap and tape to keep it dry while showering. Follow instructions from your health care provider about how to take care of your insertion site and dressing. Make sure you: Wash your hands with soap and water for at least 20 seconds before and after you change your dressing. If soap and water are not available, use hand sanitizer. Change your dressing only if taught to do so by your health care  provider. Your PICC dressing needs to be changed if it becomes loose or wet. Leave stitches (sutures), skin glue, or adhesive strips in place. These skin closures may need to stay in place for 2 weeks or longer. If adhesive strip edges start to loosen and curl up, you may trim the loose edges. Do not remove adhesive strips completely unless your health care provider tells you to do that. Follow these instructions at home: Disposal of supplies Throw away any syringes in a disposal container that is meant for sharp items (sharps container). You can buy a sharps container from a pharmacy, or you can make one by using an empty, hard plastic bottle with a lid. Place any used dressings or infusion bags into a plastic bag. Throw that bag in the trash. General instructions  Always carry your PICC identification card or wear a medical alert bracelet. Keep the tube clamped at all times, unless it is being used. Always carry a smooth-edge clamp with you to clamp the PICC if it breaks. Do not use scissors or sharp objects near the tube. You may bend your arm and move it freely. If your PICC is near or at the bend of your elbow, avoid activity with repeated motion at the elbow. Avoid lifting heavy objects as told by your health care provider. Keep all follow-up visits. This is important. You will need to have your PICC dressing changed at least once a week. Contact a health care provider if: You have pain in your arm, ear, face, or teeth. You have a fever or chills. You have redness, swelling, or pain around the insertion site. You have fluid or blood coming from the insertion site. Your insertion site feels warm to the touch. You have pus or a bad smell coming from the insertion site. Your skin feels hard and raised around the insertion site. Your PICC dressing has gotten wet or is coming off and you have not been taught how to change it. Get help right away if: You have problems with your PICC, such as  your PICC: Was tugged or pulled and has partially come out. Do not  push the PICC back in. Cannot be flushed, is hard to flush, or leaks around the insertion site when it is flushed. Makes a flushing sound when it is flushed. Appears to have a hole or tear. Is accidentally pulled all the way out. If this happens, cover the insertion site with a gauze dressing. Do not throw the PICC away. Your health care provider will need to check it to be sure the entire catheter came out. You feel your heart racing or skipping beats, or you have chest pain. You have shortness of breath or trouble breathing. You have swelling, redness, warmth, or pain in the arm in which the PICC is placed. You have a red streak going up your arm that   starts under the PICC dressing. These symptoms may be an emergency. Get help right away. Call 911. Do not wait to see if the symptoms will go away. Do not drive yourself to the hospital. Summary A peripherally inserted central catheter (PICC) is a long, thin, flexible tube (catheter) that is put into a vein in the arm or leg. If cared for properly, a PICC can remain in place for many months. Having a PICC can allow you to go home from the hospital sooner and continue treatment at home. The PICC is inserted using a germ-free (sterile) technique by a specially trained health care provider. Only a trained health care provider should remove it. Do not have your blood pressure checked on the arm in which your PICC is placed. Always keep your PICC identification card with you. This information is not intended to replace advice given to you by your health care provider. Make sure you discuss any questions you have with your health care provider. Document Revised: 07/18/2020 Document Reviewed: 07/18/2020 Elsevier Patient Education  2023 Elsevier Inc.  

## 2022-05-19 ENCOUNTER — Other Ambulatory Visit: Payer: Self-pay | Admitting: Oncology

## 2022-05-19 DIAGNOSIS — C19 Malignant neoplasm of rectosigmoid junction: Secondary | ICD-10-CM

## 2022-05-21 ENCOUNTER — Inpatient Hospital Stay: Payer: 59

## 2022-05-21 VITALS — BP 158/88 | HR 89 | Temp 98.2°F | Resp 18

## 2022-05-21 DIAGNOSIS — Z5111 Encounter for antineoplastic chemotherapy: Secondary | ICD-10-CM | POA: Diagnosis not present

## 2022-05-21 DIAGNOSIS — C786 Secondary malignant neoplasm of retroperitoneum and peritoneum: Secondary | ICD-10-CM | POA: Diagnosis not present

## 2022-05-21 DIAGNOSIS — C187 Malignant neoplasm of sigmoid colon: Secondary | ICD-10-CM | POA: Diagnosis not present

## 2022-05-21 DIAGNOSIS — Z452 Encounter for adjustment and management of vascular access device: Secondary | ICD-10-CM

## 2022-05-21 DIAGNOSIS — C7951 Secondary malignant neoplasm of bone: Secondary | ICD-10-CM | POA: Diagnosis not present

## 2022-05-21 DIAGNOSIS — C20 Malignant neoplasm of rectum: Secondary | ICD-10-CM

## 2022-05-21 DIAGNOSIS — Z95828 Presence of other vascular implants and grafts: Secondary | ICD-10-CM

## 2022-05-21 DIAGNOSIS — Z79899 Other long term (current) drug therapy: Secondary | ICD-10-CM | POA: Diagnosis not present

## 2022-05-21 MED ORDER — HEPARIN SOD (PORK) LOCK FLUSH 100 UNIT/ML IV SOLN
250.0000 [IU] | Freq: Once | INTRAVENOUS | Status: AC
  Administered 2022-05-21: 250 [IU] via INTRAVENOUS

## 2022-05-21 MED ORDER — HEPARIN SOD (PORK) LOCK FLUSH 100 UNIT/ML IV SOLN
500.0000 [IU] | Freq: Once | INTRAVENOUS | Status: AC
  Administered 2022-05-21: 250 [IU]

## 2022-05-21 MED ORDER — SODIUM CHLORIDE 0.9% FLUSH
10.0000 mL | Freq: Once | INTRAVENOUS | Status: AC
  Administered 2022-05-21: 10 mL via INTRAVENOUS

## 2022-05-21 MED ORDER — SODIUM CHLORIDE 0.9% FLUSH
10.0000 mL | Freq: Once | INTRAVENOUS | Status: AC
  Administered 2022-05-21: 10 mL

## 2022-05-21 NOTE — Patient Instructions (Signed)
PICC Home Care Guide A peripherally inserted central catheter (PICC) is a form of IV access that allows medicines and IV fluids to be quickly put into the blood and spread throughout the body. The PICC is a long, thin, flexible tube (catheter) that is put into a vein in a person's arm or leg. The catheter ends in a large vein just outside the heart called the superior vena cava (SVC). After the PICC is put in, a chest X-ray may be done to make sure that it is in the right place. A PICC may be placed for different reasons, such as: To give medicines and liquid nutrition. To give IV fluids and blood products. To take blood samples often. If there is trouble placing a peripheral intravenous (PIV) catheter. If cared for properly, a PICC can remain in place for many months. Having a PICC can allow you to go home from the hospital sooner and continue treatment at home. Medicines and PICC care can be managed at home by a family member, caregiver, or home health care team. What are the risks? Generally, having a PICC is safe. However, problems may occur, including: A blood clot (thrombus) forming in or at the end of the PICC. A blood clot forming in a vein (deep vein thrombosis) or traveling to the lung (pulmonary embolism). Inflammation of the vein (phlebitis) in which the PICC is placed. Infection at the insertion site or in the blood. Blood infections from central lines, like PICCs, can be serious and often require a hospital stay. PICC malposition, or PICC movement or poor placement. A break or cut in the PICC. Do not use scissors near the PICC. Nerve or tendon irritation or injury during PICC insertion. How to care for your PICC Please follow the specific guidelines provided by your health care provider. Preventing infection You and any caregivers should wash your hands often with soap and water for at least 20 seconds. Wash hands: Before touching the PICC or the infusion device. Before changing a  bandage (dressing). Do not change the dressing unless you have been taught to do so and have shown you are able to change it safely. Flush the PICC as told. Tell your health care provider right away if the PICC is hard to flush or does not flush. Do not use force to flush the PICC. Use clean and germ-free (sterile) supplies only. Keep the supplies in a dry place. Do not reuse needles, syringes, or any other supplies. Reusing supplies can lead to infection. Keep the PICC dressing dry and secure it with tape if the edges stop sticking to your skin. Check your PICC insertion site every day for signs of infection. Check for: Redness, swelling, or pain. Fluid or blood. Warmth. Pus or a bad smell. Preventing other problems Do not use a syringe that is less than 10 mL to flush the PICC. Do not have your blood pressure checked on the arm in which the PICC is placed. Do not ever pull or tug on the PICC. Keep it secured to your arm with tape or a stretch wrap when not in use. Do not take the PICC out yourself. Only a trained health care provider should remove the PICC. Keep pets and children away from your PICC. How to care for your PICC dressing Keep your PICC dressing clean and dry to prevent infection. Do not take baths, swim, or use a hot tub until your health care provider approves. Ask your health care provider if you can take   showers. You may only be allowed to take sponge baths. When you are allowed to shower: Ask your health care provider to teach you how to wrap the PICC. Cover the PICC with clear plastic wrap and tape to keep it dry while showering. Follow instructions from your health care provider about how to take care of your insertion site and dressing. Make sure you: Wash your hands with soap and water for at least 20 seconds before and after you change your dressing. If soap and water are not available, use hand sanitizer. Change your dressing only if taught to do so by your health care  provider. Your PICC dressing needs to be changed if it becomes loose or wet. Leave stitches (sutures), skin glue, or adhesive strips in place. These skin closures may need to stay in place for 2 weeks or longer. If adhesive strip edges start to loosen and curl up, you may trim the loose edges. Do not remove adhesive strips completely unless your health care provider tells you to do that. Follow these instructions at home: Disposal of supplies Throw away any syringes in a disposal container that is meant for sharp items (sharps container). You can buy a sharps container from a pharmacy, or you can make one by using an empty, hard plastic bottle with a lid. Place any used dressings or infusion bags into a plastic bag. Throw that bag in the trash. General instructions  Always carry your PICC identification card or wear a medical alert bracelet. Keep the tube clamped at all times, unless it is being used. Always carry a smooth-edge clamp with you to clamp the PICC if it breaks. Do not use scissors or sharp objects near the tube. You may bend your arm and move it freely. If your PICC is near or at the bend of your elbow, avoid activity with repeated motion at the elbow. Avoid lifting heavy objects as told by your health care provider. Keep all follow-up visits. This is important. You will need to have your PICC dressing changed at least once a week. Contact a health care provider if: You have pain in your arm, ear, face, or teeth. You have a fever or chills. You have redness, swelling, or pain around the insertion site. You have fluid or blood coming from the insertion site. Your insertion site feels warm to the touch. You have pus or a bad smell coming from the insertion site. Your skin feels hard and raised around the insertion site. Your PICC dressing has gotten wet or is coming off and you have not been taught how to change it. Get help right away if: You have problems with your PICC, such as  your PICC: Was tugged or pulled and has partially come out. Do not  push the PICC back in. Cannot be flushed, is hard to flush, or leaks around the insertion site when it is flushed. Makes a flushing sound when it is flushed. Appears to have a hole or tear. Is accidentally pulled all the way out. If this happens, cover the insertion site with a gauze dressing. Do not throw the PICC away. Your health care provider will need to check it to be sure the entire catheter came out. You feel your heart racing or skipping beats, or you have chest pain. You have shortness of breath or trouble breathing. You have swelling, redness, warmth, or pain in the arm in which the PICC is placed. You have a red streak going up your arm that   starts under the PICC dressing. These symptoms may be an emergency. Get help right away. Call 911. Do not wait to see if the symptoms will go away. Do not drive yourself to the hospital. Summary A peripherally inserted central catheter (PICC) is a long, thin, flexible tube (catheter) that is put into a vein in the arm or leg. If cared for properly, a PICC can remain in place for many months. Having a PICC can allow you to go home from the hospital sooner and continue treatment at home. The PICC is inserted using a germ-free (sterile) technique by a specially trained health care provider. Only a trained health care provider should remove it. Do not have your blood pressure checked on the arm in which your PICC is placed. Always keep your PICC identification card with you. This information is not intended to replace advice given to you by your health care provider. Make sure you discuss any questions you have with your health care provider. Document Revised: 07/18/2020 Document Reviewed: 07/18/2020 Elsevier Patient Education  2023 Elsevier Inc.  

## 2022-05-26 ENCOUNTER — Encounter: Payer: Self-pay | Admitting: Oncology

## 2022-05-26 ENCOUNTER — Encounter (HOSPITAL_BASED_OUTPATIENT_CLINIC_OR_DEPARTMENT_OTHER): Payer: Self-pay | Admitting: Emergency Medicine

## 2022-05-26 ENCOUNTER — Other Ambulatory Visit: Payer: Self-pay

## 2022-05-26 ENCOUNTER — Encounter: Payer: Self-pay | Admitting: *Deleted

## 2022-05-26 ENCOUNTER — Telehealth: Payer: Self-pay | Admitting: Nurse Practitioner

## 2022-05-26 ENCOUNTER — Inpatient Hospital Stay: Payer: 59

## 2022-05-26 ENCOUNTER — Emergency Department (HOSPITAL_BASED_OUTPATIENT_CLINIC_OR_DEPARTMENT_OTHER): Payer: 59 | Admitting: Radiology

## 2022-05-26 ENCOUNTER — Inpatient Hospital Stay (HOSPITAL_BASED_OUTPATIENT_CLINIC_OR_DEPARTMENT_OTHER): Payer: 59 | Admitting: Nurse Practitioner

## 2022-05-26 ENCOUNTER — Emergency Department (HOSPITAL_BASED_OUTPATIENT_CLINIC_OR_DEPARTMENT_OTHER): Payer: 59

## 2022-05-26 ENCOUNTER — Emergency Department (HOSPITAL_BASED_OUTPATIENT_CLINIC_OR_DEPARTMENT_OTHER)
Admission: EM | Admit: 2022-05-26 | Discharge: 2022-05-26 | Disposition: A | Discharge status: Home / Self Care | Payer: 59 | Attending: Emergency Medicine | Admitting: Emergency Medicine

## 2022-05-26 ENCOUNTER — Other Ambulatory Visit (HOSPITAL_BASED_OUTPATIENT_CLINIC_OR_DEPARTMENT_OTHER): Payer: Self-pay

## 2022-05-26 ENCOUNTER — Encounter: Payer: Self-pay | Admitting: Nurse Practitioner

## 2022-05-26 VITALS — BP 190/134 | HR 82 | Temp 98.2°F | Resp 20 | Ht 64.0 in | Wt 151.0 lb

## 2022-05-26 DIAGNOSIS — Z79899 Other long term (current) drug therapy: Secondary | ICD-10-CM | POA: Diagnosis not present

## 2022-05-26 DIAGNOSIS — C19 Malignant neoplasm of rectosigmoid junction: Secondary | ICD-10-CM

## 2022-05-26 DIAGNOSIS — Z85038 Personal history of other malignant neoplasm of large intestine: Secondary | ICD-10-CM | POA: Diagnosis not present

## 2022-05-26 DIAGNOSIS — I10 Essential (primary) hypertension: Secondary | ICD-10-CM | POA: Diagnosis not present

## 2022-05-26 DIAGNOSIS — R519 Headache, unspecified: Secondary | ICD-10-CM | POA: Diagnosis not present

## 2022-05-26 DIAGNOSIS — Z452 Encounter for adjustment and management of vascular access device: Secondary | ICD-10-CM

## 2022-05-26 DIAGNOSIS — R079 Chest pain, unspecified: Secondary | ICD-10-CM | POA: Diagnosis not present

## 2022-05-26 DIAGNOSIS — G4489 Other headache syndrome: Secondary | ICD-10-CM

## 2022-05-26 LAB — CMP (CANCER CENTER ONLY)
ALT: 14 U/L (ref 0–44)
AST: 16 U/L (ref 15–41)
Albumin: 3.8 g/dL (ref 3.5–5.0)
Alkaline Phosphatase: 203 U/L — ABNORMAL HIGH (ref 38–126)
Anion gap: 9 (ref 5–15)
BUN: 9 mg/dL (ref 6–20)
CO2: 26 mmol/L (ref 22–32)
Calcium: 9 mg/dL (ref 8.9–10.3)
Chloride: 102 mmol/L (ref 98–111)
Creatinine: 0.5 mg/dL (ref 0.44–1.00)
GFR, Estimated: >60 mL/min (ref >60)
Glucose, Bld: 90 mg/dL (ref 70–99)
Potassium: 3.3 mmol/L — ABNORMAL LOW (ref 3.5–5.1)
Sodium: 137 mmol/L (ref 135–145)
Total Bilirubin: 0.3 mg/dL (ref 0.3–1.2)
Total Protein: 7 g/dL (ref 6.5–8.1)

## 2022-05-26 LAB — CBC WITH DIFFERENTIAL (CANCER CENTER ONLY)
Abs Immature Granulocytes: 0 10*3/uL (ref 0.00–0.07)
Basophils Absolute: 0 10*3/uL (ref 0.0–0.1)
Basophils Relative: 1 %
Eosinophils Absolute: 0.2 10*3/uL (ref 0.0–0.5)
Eosinophils Relative: 4 %
HCT: 35.5 % — ABNORMAL LOW (ref 36.0–46.0)
Hemoglobin: 11.5 g/dL — ABNORMAL LOW (ref 12.0–15.0)
Immature Granulocytes: 0 %
Lymphocytes Relative: 51 %
Lymphs Abs: 2.6 10*3/uL (ref 0.7–4.0)
MCH: 30.3 pg (ref 26.0–34.0)
MCHC: 32.4 g/dL (ref 30.0–36.0)
MCV: 93.4 fL (ref 80.0–100.0)
Monocytes Absolute: 0.5 10*3/uL (ref 0.1–1.0)
Monocytes Relative: 11 %
Neutro Abs: 1.7 10*3/uL (ref 1.7–7.7)
Neutrophils Relative %: 33 %
Platelet Count: 183 10*3/uL (ref 150–400)
RBC: 3.8 MIL/uL — ABNORMAL LOW (ref 3.87–5.11)
RDW: 14.5 % (ref 11.5–15.5)
WBC Count: 5.1 10*3/uL (ref 4.0–10.5)
nRBC: 0 % (ref 0.0–0.2)

## 2022-05-26 LAB — BASIC METABOLIC PANEL
Anion gap: 8 (ref 5–15)
BUN: 9 mg/dL (ref 6–20)
CO2: 28 mmol/L (ref 22–32)
Calcium: 9.1 mg/dL (ref 8.9–10.3)
Chloride: 103 mmol/L (ref 98–111)
Creatinine, Ser: 0.49 mg/dL (ref 0.44–1.00)
GFR, Estimated: >60 mL/min (ref >60)
Glucose, Bld: 80 mg/dL (ref 70–99)
Potassium: 3.4 mmol/L — ABNORMAL LOW (ref 3.5–5.1)
Sodium: 139 mmol/L (ref 135–145)

## 2022-05-26 LAB — DIFFERENTIAL
Abs Immature Granulocytes: 0.01 10*3/uL (ref 0.00–0.07)
Basophils Absolute: 0 10*3/uL (ref 0.0–0.1)
Basophils Relative: 1 %
Eosinophils Absolute: 0.2 10*3/uL (ref 0.0–0.5)
Eosinophils Relative: 3 %
Immature Granulocytes: 0 %
Lymphocytes Relative: 53 %
Lymphs Abs: 2.4 10*3/uL (ref 0.7–4.0)
Monocytes Absolute: 0.5 10*3/uL (ref 0.1–1.0)
Monocytes Relative: 10 %
Neutro Abs: 1.5 10*3/uL — ABNORMAL LOW (ref 1.7–7.7)
Neutrophils Relative %: 33 %

## 2022-05-26 LAB — CBC
HCT: 36.2 % (ref 36.0–46.0)
Hemoglobin: 11.7 g/dL — ABNORMAL LOW (ref 12.0–15.0)
MCH: 30.4 pg (ref 26.0–34.0)
MCHC: 32.3 g/dL (ref 30.0–36.0)
MCV: 94 fL (ref 80.0–100.0)
Platelets: 188 10*3/uL (ref 150–400)
RBC: 3.85 MIL/uL — ABNORMAL LOW (ref 3.87–5.11)
RDW: 14.6 % (ref 11.5–15.5)
WBC: 4.6 10*3/uL (ref 4.0–10.5)
nRBC: 0 % (ref 0.0–0.2)

## 2022-05-26 LAB — CEA (ACCESS): CEA (CHCC): 7.54 ng/mL — ABNORMAL HIGH (ref 0.00–5.00)

## 2022-05-26 LAB — TROPONIN I (HIGH SENSITIVITY)
Troponin I (High Sensitivity): 8 ng/L (ref <18)
Troponin I (High Sensitivity): 8 ng/L (ref <18)

## 2022-05-26 MED ORDER — HEPARIN SOD (PORK) LOCK FLUSH 100 UNIT/ML IV SOLN
500.0000 [IU] | Freq: Once | INTRAVENOUS | Status: AC
  Administered 2022-05-26: 500 [IU] via INTRAVENOUS

## 2022-05-26 MED ORDER — KETOROLAC TROMETHAMINE 15 MG/ML IJ SOLN
15.0000 mg | Freq: Once | INTRAMUSCULAR | Status: AC
  Administered 2022-05-26: 15 mg via INTRAVENOUS
  Filled 2022-05-26: qty 1

## 2022-05-26 MED ORDER — PROCHLORPERAZINE EDISYLATE 10 MG/2ML IJ SOLN
10.0000 mg | Freq: Once | INTRAMUSCULAR | Status: AC
  Administered 2022-05-26: 10 mg via INTRAVENOUS
  Filled 2022-05-26: qty 2

## 2022-05-26 MED ORDER — LABETALOL HCL 5 MG/ML IV SOLN
10.0000 mg | Freq: Once | INTRAVENOUS | Status: AC
  Administered 2022-05-26: 10 mg via INTRAVENOUS
  Filled 2022-05-26: qty 4

## 2022-05-26 MED ORDER — HEPARIN SOD (PORK) LOCK FLUSH 100 UNIT/ML IV SOLN
500.0000 [IU] | Freq: Once | INTRAVENOUS | Status: AC
  Administered 2022-05-26: 500 [IU]
  Filled 2022-05-26: qty 5

## 2022-05-26 MED ORDER — ACETAMINOPHEN 325 MG PO TABS
650.0000 mg | ORAL_TABLET | Freq: Once | ORAL | Status: AC
  Administered 2022-05-26: 650 mg via ORAL
  Filled 2022-05-26: qty 2

## 2022-05-26 NOTE — ED Provider Notes (Signed)
April EMERGENCY DEPARTMENT AT [facility] Provider Note   CSN: [id number] Arrival date & time: 05/26/22  1015     History  Chief Complaint  Patient presents with   Hypertension    April [name] is a 47 y.o. female.  The history is provided by the patient.  Patient with history of hypertension, colon cancer receiving active treatment presents for headache and elevated blood pressure Patient arrived at her oncologist office this morning for chemotherapy and she reported headache and they checked her blood pressure and it was elevated.  She was directed to the emergency department for further evaluation.  Patient reports her symptoms began about 3 days ago.  She reports she will have mild headaches and is felt lightheaded and dizzy at times.  No trauma.  No syncope.  No focal weakness.  She reports mild blurred vision, but no diplopia or visual loss.  Over the past days she has had palpitations and now having indigestion.  No significant shortness of breath at this time.  She reports medication compliance for her blood pressure. Denies known history of CVA/CAD    Past Medical History:  Diagnosis Date   Benign essential hypertension 10/06/2017   Last Assessment & Plan:  Formatting of this note might be different from the original. Patient's blood pressure noted to be elevated at today's visit 188/98.  Patient will be provided with antihypertensives in the treatment center if needed in order to meet her Avastin parameters.  Patient recommended to follow up with her primary care physician regarding medication management.  Patient is not cur   Cancer [facility])    Colon cancer (HCC)    Hypertension    Renal calculi    bilateral    Home Medications Prior to Admission medications   Medication Sig Start Date End Date Taking? Authorizing Provider  alum & mag hydroxide-simeth (MAALOX/MYLANTA) 200-200-20 MG/5ML suspension Take 30 mLs by mouth every 6 (six) hours as needed for  indigestion or heartburn. 04/18/22   Glade Lloyd, MD  amLODipine-valsartan (EXFORGE) 10-320 MG tablet Take 1 tablet by mouth daily. 04/25/22   Tollie Eth, NP  dexamethasone (DECADRON) 4 MG tablet Take 1 tablet twice a day for 3 days beginning day 2 04/28/22   Rana Snare, NP  HYDROcodone-acetaminophen [facility]) 10-325 MG tablet Take 1 tablet by mouth every 6 (six) hours as needed. 05/07/22   Rana Snare, NP  lactulose (CHRONULAC) 10 GM/15ML solution Take 15 mLs (10 g total) by mouth 2 (two) times daily. Take twice daily until good bowel movement, then decrease to 15 ml once daily Patient not taking: Reported on 04/16/2022 04/04/22   Ladene Artist, MD  LORazepam (ATIVAN) 0.5 MG tablet Take 1 tablet (0.5 mg total) by mouth every 8 (eight) hours as needed (for nausea). Do not drive while taking, do not take with hydrocodone Patient not taking: Reported on 05/26/2022 05/12/22   Rana Snare, NP  metoprolol succinate (TOPROL-XL) 50 MG 24 hr tablet Take 1 tablet (50 mg total) by mouth daily. Take with or immediately following a meal. 04/25/22   Early, Sung Amabile, NP  Multiple Vitamin (MULTIVITAMIN WITH MINERALS) TABS tablet Take 1 tablet by mouth daily. Patient not taking: Reported on 04/16/2022 03/28/22   Narda Bonds, MD  ondansetron (ZOFRAN) 4 MG tablet Take 1 tablet (4 mg total) by mouth every 8 (eight) hours as needed for nausea or vomiting. Patient not taking: Reported on 05/26/2022 04/18/22   Glade Lloyd, MD  pantoprazole (PROTONIX) 40 MG tablet Starting on 03/27/2022, take 40 mg by mouth two times a day before meals for 60 days. On Day #61, decrease to 40 mg once a day before a meal. 04/18/22   Hanley Ben, Kshitiz, MD  Plecanatide (TRULANCE) 3 MG TABS Take 3 mg by mouth daily as needed (for constipation). Patient not taking: Reported on 04/25/2022 09/30/21   [provider]  Polyethylene Glycol 3350 (MIRALAX PO) Take 17 g by mouth See admin instructions. Mix 17 grams of powder into the suggested  amount of water or liquid and drink one to two times a day as needed for constipation    [provider]  prochlorperazine (COMPAZINE) 10 MG tablet Take 1 tablet (10 mg total) by mouth every 6 (six) hours as needed for nausea or vomiting. 04/28/22   Rana Snare, NP  senna-docusate (SENOKOT-S) 8.6-50 MG tablet Take 1 tablet by mouth 2 (two) times daily. Patient not taking: Reported on 04/25/2022 04/18/22   Glade Lloyd, MD      Allergies    Lisinopril, Irinotecan, and Leucovorin calcium    Review of Systems   Review of Systems  Constitutional:  Negative for fever.  Neurological:  Positive for headaches. Negative for syncope and speech difficulty.    Physical Exam Updated Vital Signs BP (!) 149/117   Pulse 84   Temp 97.6 F (36.4 C) (Oral)   Resp 17   Ht 1.626 m (5\' 4" )   Wt 68.5 kg   LMP 01/07/2006   SpO2 99%   BMI 25.92 kg/m  Physical Exam CONSTITUTIONAL: Chronically ill-appearing, no acute distress HEAD: Normocephalic/atraumatic EYES: EOMI/PERRL, no nystagmus, no ptosis ENMT: Mucous membranes moist NECK: supple no meningeal signs, no bruits SPINE/BACK:entire spine nontender CV: S1/S2 noted, no murmurs/rubs/gallops noted LUNGS: Lungs are clear to auscultation bilaterally, no apparent distress ABDOMEN: soft, nontender, no rebound or guarding GU:no cva tenderness NEURO:Awake/alert, face symmetric, no arm or leg drift is noted Equal 5/5 strength with shoulder abduction, elbow flex/extension, wrist flex/extension in upper extremities and equal hand grips bilaterally Equal 5/5 strength with hip flexion,knee flex/extension, foot dorsi/plantar flexion Cranial nerves 3/4/5/6/07/21/08/11/12 tested and intact Gait normal without ataxia No past pointing Sensation to light touch intact in all extremities EXTREMITIES: pulses normal, full ROM SKIN: warm, color normal PSYCH: no abnormalities of mood noted, alert and oriented to situation  ED Results / Procedures /  Treatments   Labs (all labs ordered are listed, but only abnormal results are displayed) Labs Reviewed  BASIC METABOLIC PANEL - Abnormal; Notable for the following components:      Result Value   Potassium 3.4 (*)    All other components within normal limits  CBC - Abnormal; Notable for the following components:   RBC 3.85 (*)    Hemoglobin 11.7 (*)    All other components within normal limits  DIFFERENTIAL - Abnormal; Notable for the following components:   Neutro Abs 1.5 (*)    All other components within normal limits  TROPONIN I (HIGH SENSITIVITY)  TROPONIN I (HIGH SENSITIVITY)    EKG EKG Interpretation  Date/Time:  Monday May 26 2022 10:28:13 EDT Ventricular Rate:  75 PR Interval:  143 QRS Duration: 90 QT Interval:  408 QTC Calculation: 456 R Axis:   42 Text Interpretation: Sinus rhythm Left atrial enlargement Probable left ventricular hypertrophy Confirmed by Zadie Rhine ([id number]) on 05/26/2022 10:34:41 AM  Radiology CT HEAD WO CONTRAST  Result Date: 05/26/2022 CLINICAL DATA:  Headaches EXAM: CT HEAD WITHOUT CONTRAST  TECHNIQUE: Contiguous axial images were obtained from the base of the skull through the vertex without intravenous contrast. RADIATION DOSE REDUCTION: This exam was performed according to the departmental dose-optimization program which includes automated exposure control, adjustment of the mA and/or kV according to patient size and/or use of iterative reconstruction technique. COMPARISON:  01/16/2021 FINDINGS: Brain: No acute intracranial findings are seen. There are no signs of bleeding within the cranium. Right lateral ventricle is more prominent than the left lateral ventricle. This finding has not changed. There is no focal edema or mass effect. Vascular: Unremarkable. Skull: No acute findings are seen. Sinuses/Orbits: Unremarkable. Other: No significant interval changes are noted. IMPRESSION: No acute intracranial findings are seen in noncontrast CT brain.  No significant interval changes are noted since 01/16/2021. Electronically Signed   By: Ernie Avena M.D.   On: 05/26/2022 11:39   DG Chest 2 View  Result Date: 05/26/2022 CLINICAL DATA:  Pain.  History of metastatic colon cancer EXAM: CHEST - 2 VIEW COMPARISON:  X-ray 03/24/2022 FINDINGS: No consolidation, pneumothorax or effusion. No edema. Normal cardiopericardial silhouette. Overlapping cardiac leads. Left-sided PICC with tip at the central SVC above the right atrium. Curvature of the spine IMPRESSION: Left-sided PICC No acute cardiopulmonary disease Electronically Signed   By: Karen Kays M.D.   On: 05/26/2022 11:32    Procedures Procedures    Medications Ordered in ED Medications  prochlorperazine (COMPAZINE) injection 10 mg (10 mg Intravenous Given 05/26/22 1124)  acetaminophen (TYLENOL) tablet 650 mg (650 mg Oral Given 05/26/22 1122)  ketorolac (TORADOL) 15 MG/ML injection 15 mg (15 mg Intravenous Given 05/26/22 1237)  labetalol (NORMODYNE) injection 10 mg (10 mg Intravenous Given 05/26/22 1239)    ED Course/ Medical Decision Making/ A&P Clinical Course as of 05/26/22 1432  Mon May 26, 2022  1112 Patient sent from oncology clinic due to elevated blood pressure and headache.  Patient appears photophobic but is in no acute distress, no signs of CVA at this time.  She also mentions fluttering, palpitations and indigestion.  Imaging and labs have been ordered [DW]  1431 Patient improved.  She has been resting comfortably.  Workup overall unremarkable. She has been ambulatory.  Blood pressure is improving.  No signs of acute neurologic emergency.  She is afebrile, no meningeal signs suggest meningitis. Suspect most of her symptoms were caused by uncontrolled blood pressure, but no signs of hypertensive emergency.  She will need to call oncology for her next treatment [DW]    Clinical Course User Index [DW] Zadie Rhine, MD                             Medical Decision  Making Amount and/or Complexity of Data Reviewed Labs: ordered. Radiology: ordered.  Risk OTC drugs. Prescription drug management.   This patient presents to the ED for concern of headache & chest pain, this involves an extensive number of treatment options, and is a complaint that carries with it a high risk of complications and morbidity.  The differential diagnosis includes but is not limited to  subarachnoid hemorrhage, intracranial hemorrhage, meningitis, encephalitis, CVST, temporal arteritis, idiopathic intracranial hypertension, migraine acute coronary syndrome, aortic dissection, pulmonary embolism, pericarditis, pneumothorax, pneumonia, myocarditis, pleurisy, esophageal rupture  Comorbidities that complicate the patient evaluation: Patient's presentation is complicated by their history of hypertension and colon cancer   Additional history obtained: Records reviewed  oncology notes reviewed  Lab Tests: I Ordered, and personally interpreted labs.  The pertinent results include: Labs overall unremarkable  Imaging Studies ordered: I ordered imaging studies including CT scan head and X-ray chest   I independently visualized and interpreted imaging which showed no acute findings I agree with the radiologist interpretation  Cardiac Monitoring: The patient was maintained on a cardiac monitor.  I personally viewed and interpreted the cardiac monitor which showed an underlying rhythm of:  sinus rhythm  Medicines ordered and prescription drug management: I ordered medication including compazine  for headache  Reevaluation of the patient after these medicines showed that the patient    improved   Reevaluation: After the interventions noted above, I reevaluated the patient and found that they have :improved  Complexity of problems addressed: Patient's presentation is most consistent with  acute presentation with potential threat to life or bodily function  Disposition: After  consideration of the diagnostic results and the patient's response to treatment,  I feel that the patent would benefit from discharge   .           Final Clinical Impression(s) / ED Diagnoses Final diagnoses:  Primary hypertension  Other headache syndrome    Rx / DC Orders ED Discharge Orders     None         Zadie Rhine, MD 05/26/22 1433

## 2022-05-26 NOTE — Progress Notes (Signed)
Spoke w/Adam w/Sara Early's office regarding ER visit for HTN today. Was given IV dose of labetalol and d/c with BP 149/117. Per oncology, needs to get her BP under control for continued chemotherapy. He will reach out to her for f/u there.

## 2022-05-26 NOTE — ED Triage Notes (Signed)
Pt ambulatory from cancer center upstairs. RN called and reports pt is coming to ED d/t symptomatic HTN when arriving to cancer center for chemo tx. Pt caox4 c/o headache, chest tightness, and visual changes since last night. Pt reports dizziness x2 days.

## 2022-05-26 NOTE — Telephone Encounter (Signed)
I called back to the cancer center and talked to Darl Pikes [facility] [place] I advised that she needed to go to the hospital with her BP being that high. Darl Pikes is going to relay the message to the pt.

## 2022-05-26 NOTE — Discharge Instructions (Addendum)

## 2022-05-26 NOTE — ED Notes (Signed)
Walks unassisted steady on feet. Reports HA much better. Remains hypertensive.

## 2022-05-26 NOTE — Telephone Encounter (Signed)
April [name] at [facility] t# [phone] & pt is there & BP is 190/134 & they have checked it multiple times,  they want to know if she should be sent to ER or come here?  Info given to [facility]

## 2022-05-26 NOTE — Progress Notes (Signed)
[facility] OFFICE PROGRESS NOTE   Diagnosis:  Colon cancer  INTERVAL HISTORY:   April [name] returns as scheduled.  She completed cycle 3 FOLFOX 05/12/2022.  Nausea significantly improved.  No vomiting.  Abdominal pain is much better.  She takes pain medication infrequently.  Minimal intermittent mild cold sensitivity.  No numbness or tingling in the absence of cold exposure.  Good appetite.  She is not taking potassium consistently.  She woke up with a headache this morning.  She intermittently notes a "fluttery" sensation in her chest.  She reports taking both blood pressure medications (amlodipine/valsartan and Toprol-XL) this morning.  Objective:  Vital signs in last 24 hours:  Blood pressure (!) 182/98, pulse 82, temperature 98.2 F (36.8 C), temperature source Oral, resp. rate 20, height 5\' 4"  (1.626 m), weight 151 lb (68.5 kg), last menstrual period 01/07/2006, SpO2 100 %.    HEENT: No thrush or ulcers. Resp: Lungs clear bilaterally. Cardio: Regular rate and rhythm. GI: Abdomen soft and nontender.  No hepatosplenomegaly. Vascular: No leg edema. Neuro: Vibratory sense intact over the fingertips per tuning fork exam.  Alert and oriented.  Follows commands.  Gait normal. Skin: Palms without erythema. Left upper extremity PICC without erythema.  Lab Results:  Lab Results  Component Value Date   WBC 5.1 05/26/2022   HGB 11.5 (L) 05/26/2022   HCT 35.5 (L) 05/26/2022   MCV 93.4 05/26/2022   PLT 183 05/26/2022   NEUTROABS 1.7 05/26/2022    Imaging:  No results found.  Medications: I have reviewed the patient's current medications.  Assessment/Plan: Sigmoid colon cancer, stage IV (pT4a,pN2b,M1c) Colonoscopy 03/24/2019-3 rectal polyps-hyperplastic polyps, distal colon biopsy-at least intramucosal adenocarcinoma, completely obstructing mass in the distal sigmoid colon, could not be traversed, intact mismatch repair protein expression 03/24/2019-CEA  56.1 03/29/2019 CT abdomen/pelvis-circumferential thickening involving the entire mid and distal sigmoid colon to the level of the rectosigmoid junction, solid/cystic mass in the left ovary, bilateral nephrolithiasis Robotic assisted low anterior resection, mesenteric lymphadenectomy, bilateral salpingo-oophorectomy 04/08/2019 Pathology ([facility] review of outside pathology) metastatic adenocarcinoma involving the peritoneum overlying the round ligament, serosa of the urinary bladder, serosa of the right ureter a sacral area, pelvic peritoneum, and left ovary.  Omental biopsy with focal mucin pools with no carcinoma cells identified, right hemidiaphragm biopsy involved by metastatic adenocarcinoma, invasive adenocarcinoma the sigmoid colon, moderately differentiated, T4a, perineural and vascular invasion present, 7/22 lymph nodes, multiple tumor deposits, resection margins negative Negative for PD-L1, low probability of MSI-high, HER-2 negative, negative for BRAF, NRAS and KRAS alterations CTs 05/19/2019-no evidence of metastatic disease, findings suspicious for colitis of the transverse and ascending colon, small amount of ascites in the cul-de-sac, bilateral renal calculi Cycle 1 FOLFIRI 05/24/2019, bevacizumab added with cycle 2 Cycle 4 FOLFIRI/bevacizumab 07/05/2019 Cycle 5 FOLFIRI 07/27/2019, bevacizumab held secondary to hypertension Cycle 6 FOLFIRI 08/10/1999, bevacizumab held secondary to hypertension CTs 08/30/2019-no evidence of recurrent disease, new subsolid right lower lobe nodule felt to be inflammatory, emphysema Cycle 7 FOLFIRI 09/26/2019, bevacizumab remains on hold secondary to hypertension Cycle 8 FOLFIRI 10/17/2019, bevacizumab held secondary to hypertension, Udenyca added for neutropenia Cycle 9 FOLFIRI 11/07/2019, bevacizumab held secondary to hypertension, Udenyca Cycle 10 FOLFIRI 11/28/2019, bevacizumab held, Udenyca Cycle 11 FOLFIRI 12/26/2019, bevacizumab held, Udenyca CTs 01/25/2020-no  evidence of recurrent disease, resolution of right lower lobe nodule Maintenance Xeloda beginning 02/06/2020 CTs 04/25/2020- no evidence of metastatic disease, multiple bilateral renal calculi without hydronephrosis Maintenance Xeloda continued CT abdomen/pelvis without contrast 08/10/2020-bilateral staghorn renal calculi,  no evidence of metastatic disease; addendum 08/23/2020-small but increasing omental nodules. Cycle 1 FOLFIRI/panitumumab 09/19/2020 Cycle 2 FOLFIRI/Panitumumab 10/03/2020 Cycle 3 FOLFIRI/Panitumumab 10/17/2020 Cycle 4 FOLFIRI/panitumumab 10/31/2020 Cycle 5 FOLFIRI/Panitumumab 11/14/2020 CTs 11/26/2020-stable omental metastases compared to 10/22/2020, mildly decreased from 08/10/2020.  Left lower quadrant tiny paracolic gutter implant slightly decreased from CT 08/10/2020.  No new or progressive metastatic disease in the abdomen or pelvis. Cycle 6 FOLFIRI/Panitumumab 11/28/2020, irinotecan dose reduced, treatment schedule adjusted to every 3 weeks going forward Cycle 7 FOLFIRI/panitumumab 12/24/2020 Cycle 8 FOLFIRI/Panitumumab 01/16/2021--treatment held due to hypertension in the infusion area. Cycle 8 FOLFIRI/panitumumab 01/21/2021 Cycle 9 FOLFIRI/panitumumab 02/11/2021 Cycle 10 FOLFIRI/Panitumumab 03/04/2021 Cycle 11 FOLFIRI/Panitumumab 03/25/2021 CT abdomen/pelvis 04/10/2021-stable right omental and left posterior paracolic gutter nodules Cycle 12 FOLFIRI/panitumumab 04/15/2021 Cycle 13 FOLFIRI/panitumumab 05/06/2021 Cycle 14 FOLFIRI/Panitumumab 05/27/2021 Cycle 15 FOLFIRI/Panitumumab 06/17/2021 CT abdomen/pelvis 07/05/2021-slight enlargement of a dominant right omental implant, no new implants Patient requested a treatment break CT abdomen/pelvis 10/04/2021-mild increase in size of multiple omental soft tissue nodules, 4 mm calculus in the distal left ureter adjacent to the ureteral stent Cycle 1 Lonsurf 10/14/2021 10/28/2021 Avastin every 2 weeks Cycle 2 Lonsurf 11/11/2021 Cycle 3 Lonsurf  12/09/2021 Cycle 4 Lonsurf 01/06/2022 Avastin held 01/20/2022 due to proteinuria, 24-hour urine 43 mg CT/pelvis 01/30/2022-possible new peritoneal implant at the transverse colon, no significant change in other omental/peritoneal implants, stable chronic rectal wall thickening, status post removal of double-J left ureteral stent with no evidence of hydronephrosis or ureteral calculus, nonobstructing bilateral renal calculi Cycle 5 Lonsurf 02/05/2022 or 02/06/2022 Avastin every 2 weeks Cycle 6 Lonsurf 03/03/2022 CTs 04/05/2022-increase in size and number of peritoneal implants compared to January 2024 central liver mass narrowing the anterior branch of the right portal vein, decreased biliary duct dilation, biliary stents in place, stable right cardiophrenic lymph nodes, separate pigtail stent in the lumen of the proximal duodenum Cycle 1 FOLFOX 04/14/2022 CT abdomen/pelvis 04/16/2022-stent within the third portion of the duodenum, stable peritoneal nodules, indwelling biliary stents with mild left biliary duct dilation Cycle 2 FOLFOX 04/28/2022, Emend and prophylactic dexamethasone added Cycle 3 FOLFOX 05/12/2022, Aloxi, Emend, prophylactic dexamethasone, Compazine and lorazepam as needed   Hypertension G4 P3, twins Kidney stones-bilateral staghorn renal calculi on CT 08/10/2020 Infected Port-A-Cath 09/12/2019-placed on Augmentin, referred for Port-A-Cath removal; Port-A-Cath removed 09/14/2019; PICC line placed 09/14/2019; culture staph aureus.  Course of Septra completed. Neutropenia secondary to chemotherapy-Udenyca added with cycle 8 FOLFIRI Right nephrostomy tube 09/12/2020; stent placed 10/09/2020, percutaneous nephrostolithotomy treatment of right-sided kidney stones 10/09/2020, malpositioned right ureter stent replacement 10/23/2020, right ureter stent removed 10/29/2020 Percutaneous left nephrostolithotomy 10/01/2021-no renal stone identified, left ureter stent left in place Cystoscopy 10/14/2021-stent  removed 8.  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  Admission 04/16/2022 with nausea/vomiting and abdominal pain, felt to be acute toxicity related to chemotherapy, discharged home 04/18/2022  Disposition: Ms. Hirschi has metastatic colon cancer, currently on active treatment with FOLFOX.  She presents to the office today prior to proceeding with cycle 4.  Blood pressure is markedly elevated despite taking home antihypertensive regimen consistently.  She complains of a headache and a "fluttery" sensation  in her chest.  We contacted her PCP.  Recommendation is to refer to the emergency department.  We are contacting the emergency department at [facility].  We will reschedule today's chemotherapy pending evaluation in the emergency room.    Lonna Cobb ANP/GNP-BC   05/26/2022  9:46 AM

## 2022-05-26 NOTE — Patient Instructions (Signed)
PICC Home Care Guide A peripherally inserted central catheter (PICC) is a form of IV access that allows medicines and IV fluids to be quickly put into the blood and spread throughout the body. The PICC is a long, thin, flexible tube (catheter) that is put into a vein in a person's arm or leg. The catheter ends in a large vein just outside the heart called the superior vena cava (SVC). After the PICC is put in, a chest X-ray may be done to make sure that it is in the right place. A PICC may be placed for different reasons, such as: To give medicines and liquid nutrition. To give IV fluids and blood products. To take blood samples often. If there is trouble placing a peripheral intravenous (PIV) catheter. If cared for properly, a PICC can remain in place for many months. Having a PICC can allow you to go home from the hospital sooner and continue treatment at home. Medicines and PICC care can be managed at home by a family member, caregiver, or home health care team. What are the risks? Generally, having a PICC is safe. However, problems may occur, including: A blood clot (thrombus) forming in or at the end of the PICC. A blood clot forming in a vein (deep vein thrombosis) or traveling to the lung (pulmonary embolism). Inflammation of the vein (phlebitis) in which the PICC is placed. Infection at the insertion site or in the blood. Blood infections from central lines, like PICCs, can be serious and often require a hospital stay. PICC malposition, or PICC movement or poor placement. A break or cut in the PICC. Do not use scissors near the PICC. Nerve or tendon irritation or injury during PICC insertion. How to care for your PICC Please follow the specific guidelines provided by your health care provider. Preventing infection You and any caregivers should wash your hands often with soap and water for at least 20 seconds. Wash hands: Before touching the PICC or the infusion device. Before changing a  bandage (dressing). Do not change the dressing unless you have been taught to do so and have shown you are able to change it safely. Flush the PICC as told. Tell your health care provider right away if the PICC is hard to flush or does not flush. Do not use force to flush the PICC. Use clean and germ-free (sterile) supplies only. Keep the supplies in a dry place. Do not reuse needles, syringes, or any other supplies. Reusing supplies can lead to infection. Keep the PICC dressing dry and secure it with tape if the edges stop sticking to your skin. Check your PICC insertion site every day for signs of infection. Check for: Redness, swelling, or pain. Fluid or blood. Warmth. Pus or a bad smell. Preventing other problems Do not use a syringe that is less than 10 mL to flush the PICC. Do not have your blood pressure checked on the arm in which the PICC is placed. Do not ever pull or tug on the PICC. Keep it secured to your arm with tape or a stretch wrap when not in use. Do not take the PICC out yourself. Only a trained health care provider should remove the PICC. Keep pets and children away from your PICC. How to care for your PICC dressing Keep your PICC dressing clean and dry to prevent infection. Do not take baths, swim, or use a hot tub until your health care provider approves. Ask your health care provider if you can take   showers. You may only be allowed to take sponge baths. When you are allowed to shower: Ask your health care provider to teach you how to wrap the PICC. Cover the PICC with clear plastic wrap and tape to keep it dry while showering. Follow instructions from your health care provider about how to take care of your insertion site and dressing. Make sure you: Wash your hands with soap and water for at least 20 seconds before and after you change your dressing. If soap and water are not available, use hand sanitizer. Change your dressing only if taught to do so by your health care  provider. Your PICC dressing needs to be changed if it becomes loose or wet. Leave stitches (sutures), skin glue, or adhesive strips in place. These skin closures may need to stay in place for 2 weeks or longer. If adhesive strip edges start to loosen and curl up, you may trim the loose edges. Do not remove adhesive strips completely unless your health care provider tells you to do that. Follow these instructions at home: Disposal of supplies Throw away any syringes in a disposal container that is meant for sharp items (sharps container). You can buy a sharps container from a pharmacy, or you can make one by using an empty, hard plastic bottle with a lid. Place any used dressings or infusion bags into a plastic bag. Throw that bag in the trash. General instructions  Always carry your PICC identification card or wear a medical alert bracelet. Keep the tube clamped at all times, unless it is being used. Always carry a smooth-edge clamp with you to clamp the PICC if it breaks. Do not use scissors or sharp objects near the tube. You may bend your arm and move it freely. If your PICC is near or at the bend of your elbow, avoid activity with repeated motion at the elbow. Avoid lifting heavy objects as told by your health care provider. Keep all follow-up visits. This is important. You will need to have your PICC dressing changed at least once a week. Contact a health care provider if: You have pain in your arm, ear, face, or teeth. You have a fever or chills. You have redness, swelling, or pain around the insertion site. You have fluid or blood coming from the insertion site. Your insertion site feels warm to the touch. You have pus or a bad smell coming from the insertion site. Your skin feels hard and raised around the insertion site. Your PICC dressing has gotten wet or is coming off and you have not been taught how to change it. Get help right away if: You have problems with your PICC, such as  your PICC: Was tugged or pulled and has partially come out. Do not  push the PICC back in. Cannot be flushed, is hard to flush, or leaks around the insertion site when it is flushed. Makes a flushing sound when it is flushed. Appears to have a hole or tear. Is accidentally pulled all the way out. If this happens, cover the insertion site with a gauze dressing. Do not throw the PICC away. Your health care provider will need to check it to be sure the entire catheter came out. You feel your heart racing or skipping beats, or you have chest pain. You have shortness of breath or trouble breathing. You have swelling, redness, warmth, or pain in the arm in which the PICC is placed. You have a red streak going up your arm that   starts under the PICC dressing. These symptoms may be an emergency. Get help right away. Call 911. Do not wait to see if the symptoms will go away. Do not drive yourself to the hospital. Summary A peripherally inserted central catheter (PICC) is a long, thin, flexible tube (catheter) that is put into a vein in the arm or leg. If cared for properly, a PICC can remain in place for many months. Having a PICC can allow you to go home from the hospital sooner and continue treatment at home. The PICC is inserted using a germ-free (sterile) technique by a specially trained health care provider. Only a trained health care provider should remove it. Do not have your blood pressure checked on the arm in which your PICC is placed. Always keep your PICC identification card with you. This information is not intended to replace advice given to you by your health care provider. Make sure you discuss any questions you have with your health care provider. Document Revised: 07/18/2020 Document Reviewed: 07/18/2020 Elsevier Patient Education  2023 Elsevier Inc.  

## 2022-05-26 NOTE — ED Notes (Signed)
Pt calls transport. Pt verbalized understanding of d/c instructions, meds, and followup care. Denies questions. VSS, no distress noted. Steady gait to exit with all belongings.

## 2022-05-27 ENCOUNTER — Other Ambulatory Visit: Payer: Self-pay

## 2022-05-28 ENCOUNTER — Inpatient Hospital Stay: Payer: 59

## 2022-05-28 ENCOUNTER — Inpatient Hospital Stay: Payer: 59 | Admitting: Nutrition

## 2022-05-28 VITALS — BP 172/108 | HR 83 | Temp 98.1°F | Resp 18

## 2022-05-28 DIAGNOSIS — Z79899 Other long term (current) drug therapy: Secondary | ICD-10-CM | POA: Diagnosis not present

## 2022-05-28 DIAGNOSIS — C786 Secondary malignant neoplasm of retroperitoneum and peritoneum: Secondary | ICD-10-CM | POA: Diagnosis not present

## 2022-05-28 DIAGNOSIS — Z95828 Presence of other vascular implants and grafts: Secondary | ICD-10-CM

## 2022-05-28 DIAGNOSIS — C187 Malignant neoplasm of sigmoid colon: Secondary | ICD-10-CM | POA: Diagnosis not present

## 2022-05-28 DIAGNOSIS — Z5111 Encounter for antineoplastic chemotherapy: Secondary | ICD-10-CM | POA: Diagnosis not present

## 2022-05-28 DIAGNOSIS — C7951 Secondary malignant neoplasm of bone: Secondary | ICD-10-CM | POA: Diagnosis not present

## 2022-05-28 MED ORDER — SODIUM CHLORIDE 0.9% FLUSH
10.0000 mL | Freq: Once | INTRAVENOUS | Status: AC
  Administered 2022-05-28: 10 mL via INTRAVENOUS

## 2022-05-28 MED ORDER — HEPARIN SOD (PORK) LOCK FLUSH 100 UNIT/ML IV SOLN
250.0000 [IU] | Freq: Once | INTRAVENOUS | Status: AC
  Administered 2022-05-28: 250 [IU] via INTRAVENOUS

## 2022-05-28 NOTE — Progress Notes (Signed)
Patient in infusion room for treatment today.  BP checked and rechecked (see flow sheet). Patient denied headache/vision changes, dizziness, heart palpitations. Lonna Cobb, NP made aware of patient's BP.  Per Lonna Cobb, NP hold treatment for today and patient will need to follow-up with PCP before next visit.  Patient is scheduled to see PCP on 06/02/22.  Scheduling message sent and patient will be notified once she has been rescheduled.  Patient advised to go to ED if she starts to have headaches and/or vision changes. Patient verbalized understanding.

## 2022-05-28 NOTE — Progress Notes (Signed)
Patient's treatment was cancelled. I called patient on mobile number for follow up. Patient did not answer. Left message and number for return call as needed.

## 2022-05-28 NOTE — Patient Instructions (Signed)
PICC Home Care Guide A peripherally inserted central catheter (PICC) is a form of IV access that allows medicines and IV fluids to be quickly put into the blood and spread throughout the body. The PICC is a long, thin, flexible tube (catheter) that is put into a vein in a person's arm or leg. The catheter ends in a large vein just outside the heart called the superior vena cava (SVC). After the PICC is put in, a chest X-ray may be done to make sure that it is in the right place. A PICC may be placed for different reasons, such as: To give medicines and liquid nutrition. To give IV fluids and blood products. To take blood samples often. If there is trouble placing a peripheral intravenous (PIV) catheter. If cared for properly, a PICC can remain in place for many months. Having a PICC can allow you to go home from the hospital sooner and continue treatment at home. Medicines and PICC care can be managed at home by a family member, caregiver, or home health care team. What are the risks? Generally, having a PICC is safe. However, problems may occur, including: A blood clot (thrombus) forming in or at the end of the PICC. A blood clot forming in a vein (deep vein thrombosis) or traveling to the lung (pulmonary embolism). Inflammation of the vein (phlebitis) in which the PICC is placed. Infection at the insertion site or in the blood. Blood infections from central lines, like PICCs, can be serious and often require a hospital stay. PICC malposition, or PICC movement or poor placement. A break or cut in the PICC. Do not use scissors near the PICC. Nerve or tendon irritation or injury during PICC insertion. How to care for your PICC Please follow the specific guidelines provided by your health care provider. Preventing infection You and any caregivers should wash your hands often with soap and water for at least 20 seconds. Wash hands: Before touching the PICC or the infusion device. Before changing a  bandage (dressing). Do not change the dressing unless you have been taught to do so and have shown you are able to change it safely. Flush the PICC as told. Tell your health care provider right away if the PICC is hard to flush or does not flush. Do not use force to flush the PICC. Use clean and germ-free (sterile) supplies only. Keep the supplies in a dry place. Do not reuse needles, syringes, or any other supplies. Reusing supplies can lead to infection. Keep the PICC dressing dry and secure it with tape if the edges stop sticking to your skin. Check your PICC insertion site every day for signs of infection. Check for: Redness, swelling, or pain. Fluid or blood. Warmth. Pus or a bad smell. Preventing other problems Do not use a syringe that is less than 10 mL to flush the PICC. Do not have your blood pressure checked on the arm in which the PICC is placed. Do not ever pull or tug on the PICC. Keep it secured to your arm with tape or a stretch wrap when not in use. Do not take the PICC out yourself. Only a trained health care provider should remove the PICC. Keep pets and children away from your PICC. How to care for your PICC dressing Keep your PICC dressing clean and dry to prevent infection. Do not take baths, swim, or use a hot tub until your health care provider approves. Ask your health care provider if you can take   showers. You may only be allowed to take sponge baths. When you are allowed to shower: Ask your health care provider to teach you how to wrap the PICC. Cover the PICC with clear plastic wrap and tape to keep it dry while showering. Follow instructions from your health care provider about how to take care of your insertion site and dressing. Make sure you: Wash your hands with soap and water for at least 20 seconds before and after you change your dressing. If soap and water are not available, use hand sanitizer. Change your dressing only if taught to do so by your health care  provider. Your PICC dressing needs to be changed if it becomes loose or wet. Leave stitches (sutures), skin glue, or adhesive strips in place. These skin closures may need to stay in place for 2 weeks or longer. If adhesive strip edges start to loosen and curl up, you may trim the loose edges. Do not remove adhesive strips completely unless your health care provider tells you to do that. Follow these instructions at home: Disposal of supplies Throw away any syringes in a disposal container that is meant for sharp items (sharps container). You can buy a sharps container from a pharmacy, or you can make one by using an empty, hard plastic bottle with a lid. Place any used dressings or infusion bags into a plastic bag. Throw that bag in the trash. General instructions  Always carry your PICC identification card or wear a medical alert bracelet. Keep the tube clamped at all times, unless it is being used. Always carry a smooth-edge clamp with you to clamp the PICC if it breaks. Do not use scissors or sharp objects near the tube. You may bend your arm and move it freely. If your PICC is near or at the bend of your elbow, avoid activity with repeated motion at the elbow. Avoid lifting heavy objects as told by your health care provider. Keep all follow-up visits. This is important. You will need to have your PICC dressing changed at least once a week. Contact a health care provider if: You have pain in your arm, ear, face, or teeth. You have a fever or chills. You have redness, swelling, or pain around the insertion site. You have fluid or blood coming from the insertion site. Your insertion site feels warm to the touch. You have pus or a bad smell coming from the insertion site. Your skin feels hard and raised around the insertion site. Your PICC dressing has gotten wet or is coming off and you have not been taught how to change it. Get help right away if: You have problems with your PICC, such as  your PICC: Was tugged or pulled and has partially come out. Do not  push the PICC back in. Cannot be flushed, is hard to flush, or leaks around the insertion site when it is flushed. Makes a flushing sound when it is flushed. Appears to have a hole or tear. Is accidentally pulled all the way out. If this happens, cover the insertion site with a gauze dressing. Do not throw the PICC away. Your health care provider will need to check it to be sure the entire catheter came out. You feel your heart racing or skipping beats, or you have chest pain. You have shortness of breath or trouble breathing. You have swelling, redness, warmth, or pain in the arm in which the PICC is placed. You have a red streak going up your arm that   starts under the PICC dressing. These symptoms may be an emergency. Get help right away. Call 911. Do not wait to see if the symptoms will go away. Do not drive yourself to the hospital. Summary A peripherally inserted central catheter (PICC) is a long, thin, flexible tube (catheter) that is put into a vein in the arm or leg. If cared for properly, a PICC can remain in place for many months. Having a PICC can allow you to go home from the hospital sooner and continue treatment at home. The PICC is inserted using a germ-free (sterile) technique by a specially trained health care provider. Only a trained health care provider should remove it. Do not have your blood pressure checked on the arm in which your PICC is placed. Always keep your PICC identification card with you. This information is not intended to replace advice given to you by your health care provider. Make sure you discuss any questions you have with your health care provider. Document Revised: 07/18/2020 Document Reviewed: 07/18/2020 Elsevier Patient Education  2023 Elsevier Inc.  

## 2022-05-30 ENCOUNTER — Inpatient Hospital Stay: Payer: 59

## 2022-05-30 ENCOUNTER — Other Ambulatory Visit: Payer: Self-pay

## 2022-06-02 ENCOUNTER — Ambulatory Visit (INDEPENDENT_AMBULATORY_CARE_PROVIDER_SITE_OTHER): Payer: 59 | Admitting: Nurse Practitioner

## 2022-06-02 ENCOUNTER — Encounter: Payer: Self-pay | Admitting: Nurse Practitioner

## 2022-06-02 ENCOUNTER — Other Ambulatory Visit: Payer: Self-pay

## 2022-06-02 VITALS — BP 142/92 | HR 84 | Wt 148.6 lb

## 2022-06-02 DIAGNOSIS — I10 Essential (primary) hypertension: Secondary | ICD-10-CM

## 2022-06-02 DIAGNOSIS — C19 Malignant neoplasm of rectosigmoid junction: Secondary | ICD-10-CM

## 2022-06-02 MED ORDER — CHLORTHALIDONE 25 MG PO TABS
25.0000 mg | ORAL_TABLET | Freq: Every day | ORAL | 3 refills | Status: DC
Start: 2022-06-02 — End: 2022-11-24

## 2022-06-02 NOTE — Progress Notes (Signed)
Tollie Eth, DNP, AGNP-c [facility] [address] [place], [place] [address] [phone]  Subjective:   April [name] is a 47 y.o. female presents to day for evaluation of: Hospital Follow-up for blood pressure Virl Diamond presents today with concerns regarding her high blood pressure, which has been a barrier to receiving treatment at the cancer center in the recent past.  She reports alarmingly high blood pressure readings with 1 instance recording at 182/138 while at the cancer center last week.  Despite these high readings, she notes that she did not experience any severe symptoms like dizziness or blurry vision.  She did have a slight headache.  Due to the elevation in her blood pressure readings while at the cancer center she was sent to the emergency room at [facility] for evaluation and management.  Her blood pressures did eventually come down but she was unable to have her chemotherapy.  She is currently managing her condition with metoprolol 50 mg twice a day, amlodipine 10 mg daily, and hydralazine as needed for blood pressure spikes.  This regimen was a previously effective regimen for her that was discontinued due to pill burden.  Since she has restarted this she does feel that her readings have improved.   She expresses significant stress due to her inability to receive her chemotherapy treatment because of the high blood pressure.  She feels that this alone may be contributing to the elevated blood pressure readings.  She confirms that she has enough supply of her previous medications at home and she has restarted her old regimen.  Since that time her blood pressures have been well-controlled.   On 05/26/2022 the patient presented to the ED after recommendation by oncology for elevated BP. Her readings were 188/98 in the office.  Troponin's were negative and she was found to be mildly anemic, but stable. There was a slight decrease in her potassium, but  otherwise labs were normal.      PMH, Medications, and Allergies reviewed and updated in chart as appropriate.   ROS negative except for what is listed in HPI. Objective:  BP (!) 142/92   Pulse 84   Wt 148 lb 9.6 oz (67.4 kg)   LMP 01/07/2006   BMI 25.51 kg/m  Physical Exam Vitals and nursing note reviewed.  Constitutional:      General: She is not in acute distress.    Appearance: Normal appearance.  HENT:     Head: Normocephalic.  Eyes:     Conjunctiva/sclera: Conjunctivae normal.  Neck:     Vascular: No carotid bruit.  Cardiovascular:     Rate and Rhythm: Normal rate and regular rhythm.     Pulses: Normal pulses.     Heart sounds: Normal heart sounds. No murmur heard. Pulmonary:     Effort: Pulmonary effort is normal.     Breath sounds: Normal breath sounds.  Musculoskeletal:     Right lower leg: No edema.     Left lower leg: No edema.  Skin:    General: Skin is warm and dry.     Capillary Refill: Capillary refill takes less than 2 seconds.  Neurological:     General: No focal deficit present.     Mental Status: She is alert and oriented to person, place, and time.     Motor: No weakness.  Psychiatric:        Mood and Affect: Mood normal.           Assessment & Plan:  Problem List Items Addressed This Visit     Hypertension - Primary    Recent significant elevations in her blood pressure indicates poor control with the change in regimen to assist in reduction of pill burden.  She has gone back to amlodipine 10 mg, metoprolol 50 mg twice a day, and hydralazine.  I recommend that she continue with this regimen and also include chlorthalidone 50 mg daily to see if we can get improved control.  We discussed that if her blood pressures drop too low she can half this dose. Plan: Monitor blood pressures at home with a goal of less than 140/85. Continue amlodipine 10 mg, metoprolol 50 mg twice a day, chlorthalidone 25 to 50 mg daily, and hydralazine as needed if  blood pressures are greater than 140/85. Discontinue valsartan. Stress management techniques discussed and encouraged. Report immediately if blood pressure readings are remaining elevated despite treatment changes.      Relevant Medications   chlorthalidone (HYGROTON) 25 MG tablet      Tollie Eth, DNP, AGNP-c 06/24/2022  3:37 PM    History, Medications, Surgery, SDOH, and Family History reviewed and updated as appropriate.

## 2022-06-02 NOTE — Patient Instructions (Signed)
Amlodipine 10mg  daily Metoprolol 50mg  twice a day Hydralazine - as needed for high blood pressure up to three times a day   NEW Chlorthalidone 50mg  once a day  Keep taking your blood pressure - goal 140/85 or less

## 2022-06-03 ENCOUNTER — Inpatient Hospital Stay: Payer: 59

## 2022-06-03 ENCOUNTER — Inpatient Hospital Stay (HOSPITAL_BASED_OUTPATIENT_CLINIC_OR_DEPARTMENT_OTHER): Payer: 59 | Admitting: Nurse Practitioner

## 2022-06-03 ENCOUNTER — Encounter: Payer: Self-pay | Admitting: Nurse Practitioner

## 2022-06-03 VITALS — BP 140/92 | HR 87 | Temp 98.1°F | Resp 18 | Ht 64.0 in | Wt 149.8 lb

## 2022-06-03 DIAGNOSIS — C786 Secondary malignant neoplasm of retroperitoneum and peritoneum: Secondary | ICD-10-CM | POA: Diagnosis not present

## 2022-06-03 DIAGNOSIS — C19 Malignant neoplasm of rectosigmoid junction: Secondary | ICD-10-CM

## 2022-06-03 DIAGNOSIS — Z5111 Encounter for antineoplastic chemotherapy: Secondary | ICD-10-CM | POA: Diagnosis not present

## 2022-06-03 DIAGNOSIS — C20 Malignant neoplasm of rectum: Secondary | ICD-10-CM | POA: Diagnosis not present

## 2022-06-03 DIAGNOSIS — Z79899 Other long term (current) drug therapy: Secondary | ICD-10-CM | POA: Diagnosis not present

## 2022-06-03 DIAGNOSIS — C187 Malignant neoplasm of sigmoid colon: Secondary | ICD-10-CM | POA: Diagnosis not present

## 2022-06-03 DIAGNOSIS — C7951 Secondary malignant neoplasm of bone: Secondary | ICD-10-CM | POA: Diagnosis not present

## 2022-06-03 LAB — CMP (CANCER CENTER ONLY)
ALT: 12 U/L (ref 0–44)
AST: 19 U/L (ref 15–41)
Albumin: 4.1 g/dL (ref 3.5–5.0)
Alkaline Phosphatase: 128 U/L — ABNORMAL HIGH (ref 38–126)
Anion gap: 9 (ref 5–15)
BUN: 11 mg/dL (ref 6–20)
CO2: 29 mmol/L (ref 22–32)
Calcium: 9.6 mg/dL (ref 8.9–10.3)
Chloride: 102 mmol/L (ref 98–111)
Creatinine: 0.62 mg/dL (ref 0.44–1.00)
GFR, Estimated: >60 mL/min (ref >60)
Glucose, Bld: 126 mg/dL — ABNORMAL HIGH (ref 70–99)
Potassium: 3.1 mmol/L — ABNORMAL LOW (ref 3.5–5.1)
Sodium: 140 mmol/L (ref 135–145)
Total Bilirubin: 0.2 mg/dL — ABNORMAL LOW (ref 0.3–1.2)
Total Protein: 7.1 g/dL (ref 6.5–8.1)

## 2022-06-03 LAB — CBC WITH DIFFERENTIAL (CANCER CENTER ONLY)
Abs Immature Granulocytes: 0.01 10*3/uL (ref 0.00–0.07)
Basophils Absolute: 0.1 10*3/uL (ref 0.0–0.1)
Basophils Relative: 1 %
Eosinophils Absolute: 0.2 10*3/uL (ref 0.0–0.5)
Eosinophils Relative: 5 %
HCT: 36.5 % (ref 36.0–46.0)
Hemoglobin: 12.1 g/dL (ref 12.0–15.0)
Immature Granulocytes: 0 %
Lymphocytes Relative: 50 %
Lymphs Abs: 2.7 10*3/uL (ref 0.7–4.0)
MCH: 30.8 pg (ref 26.0–34.0)
MCHC: 33.2 g/dL (ref 30.0–36.0)
MCV: 92.9 fL (ref 80.0–100.0)
Monocytes Absolute: 0.7 10*3/uL (ref 0.1–1.0)
Monocytes Relative: 12 %
Neutro Abs: 1.7 10*3/uL (ref 1.7–7.7)
Neutrophils Relative %: 32 %
Platelet Count: 234 10*3/uL (ref 150–400)
RBC: 3.93 MIL/uL (ref 3.87–5.11)
RDW: 15 % (ref 11.5–15.5)
WBC Count: 5.3 10*3/uL (ref 4.0–10.5)
nRBC: 0 % (ref 0.0–0.2)

## 2022-06-03 MED ORDER — SODIUM CHLORIDE 0.9 % IV SOLN
1800.0000 mg/m2 | INTRAVENOUS | Status: DC
  Administered 2022-06-03: 3500 mg via INTRAVENOUS
  Filled 2022-06-03: qty 70

## 2022-06-03 MED ORDER — PALONOSETRON HCL INJECTION 0.25 MG/5ML
0.2500 mg | Freq: Once | INTRAVENOUS | Status: AC
  Administered 2022-06-03: 0.25 mg via INTRAVENOUS
  Filled 2022-06-03: qty 5

## 2022-06-03 MED ORDER — LEUCOVORIN CALCIUM INJECTION 350 MG
400.0000 mg/m2 | Freq: Once | INTRAVENOUS | Status: AC
  Administered 2022-06-03: 704 mg via INTRAVENOUS
  Filled 2022-06-03: qty 35.2

## 2022-06-03 MED ORDER — SODIUM CHLORIDE 0.9 % IV SOLN
150.0000 mg | Freq: Once | INTRAVENOUS | Status: AC
  Administered 2022-06-03: 150 mg via INTRAVENOUS
  Filled 2022-06-03: qty 150

## 2022-06-03 MED ORDER — SODIUM CHLORIDE 0.9 % IV SOLN
10.0000 mg | Freq: Once | INTRAVENOUS | Status: AC
  Administered 2022-06-03: 10 mg via INTRAVENOUS
  Filled 2022-06-03: qty 10

## 2022-06-03 MED ORDER — OXALIPLATIN CHEMO INJECTION 100 MG/20ML
85.0000 mg/m2 | Freq: Once | INTRAVENOUS | Status: AC
  Administered 2022-06-03: 150 mg via INTRAVENOUS
  Filled 2022-06-03: qty 10

## 2022-06-03 MED ORDER — DEXTROSE 5 % IV SOLN: Freq: Once | INTRAVENOUS | Status: AC

## 2022-06-03 MED ORDER — FLUOROURACIL CHEMO INJECTION 2.5 GM/50ML
400.0000 mg/m2 | Freq: Once | INTRAVENOUS | Status: AC
  Administered 2022-06-03: 700 mg via INTRAVENOUS
  Filled 2022-06-03: qty 14

## 2022-06-03 NOTE — Progress Notes (Signed)
Patient seen by Lonna Cobb NP today  Vitals are not all within treatment parameters. B/P 141/92   No intervention   Labs reviewed by Lonna Cobb NP and are within treatment parameters. K+ 3.1  Per physician team, patient is ready for treatment and there are NO modifications to the treatment plan.

## 2022-06-03 NOTE — Patient Instructions (Signed)
[facility]   The chemotherapy medication bag should finish at 46 hours, 96 hours, or 7 days. For example, if your pump is scheduled for 46 hours and it was put on at 4:00 p.m., it should finish at 2:00 p.m. the day it is scheduled to come off regardless of your appointment time.     Estimated time to finish at 11am Thursday May 23rd, 2024.   If the display on your pump reads "Low Volume" and it is beeping, take the batteries out of the pump and come to the cancer center for it to be taken off.   If the pump alarms go off prior to the pump reading "Low Volume" then call [phone] and someone can assist you.  If the plunger comes out and the chemotherapy medication is leaking out, please use your home chemo spill kit to clean up the spill. Do NOT use paper towels or other household products.  If you have problems or questions regarding your pump, please call either [phone] (24 hours a day) or the cancer center Monday-Friday 8:00 a.m.- 4:30 p.m. at the clinic number and we will assist you. If you are unable to get assistance, then go to the nearest Emergency Department and ask the staff to contact the IV team for assistance.  Discharge Instructions: Thank you for choosing [facility] to provide your oncology and hematology care.   If you have a lab appointment with the Cancer Center, please go directly to the Cancer Center and check in at the registration area.   Wear comfortable clothing and clothing appropriate for easy access to any Portacath or PICC line.   We strive to give you quality time with your provider. You may need to reschedule your appointment if you arrive late (15 or more minutes).  Arriving late affects you and other patients whose appointments are after yours.  Also, if you miss three or more appointments without notifying the office, you may be dismissed from the clinic at the provider's discretion.      For  prescription refill requests, have your pharmacy contact our office and allow 72 hours for refills to be completed.    Today you received the following chemotherapy and/or immunotherapy agents Oxaliplatin, Leucovorin, Fluorouracil.      To help prevent nausea and vomiting after your treatment, we encourage you to take your nausea medication as directed.  BELOW ARE SYMPTOMS THAT SHOULD BE REPORTED IMMEDIATELY: *FEVER GREATER THAN 100.4 F (38 C) OR HIGHER *CHILLS OR SWEATING *NAUSEA AND VOMITING THAT IS NOT CONTROLLED WITH YOUR NAUSEA MEDICATION *UNUSUAL SHORTNESS OF BREATH *UNUSUAL BRUISING OR BLEEDING *URINARY PROBLEMS (pain or burning when urinating, or frequent urination) *BOWEL PROBLEMS (unusual diarrhea, constipation, pain near the anus) TENDERNESS IN MOUTH AND THROAT WITH OR WITHOUT PRESENCE OF ULCERS (sore throat, sores in mouth, or a toothache) UNUSUAL RASH, SWELLING OR PAIN  UNUSUAL VAGINAL DISCHARGE OR ITCHING   Items with * indicate a potential emergency and should be followed up as soon as possible or go to the Emergency Department if any problems should occur.  Please show the CHEMOTHERAPY ALERT CARD or IMMUNOTHERAPY ALERT CARD at check-in to the Emergency Department and triage nurse.  Should you have questions after your visit or need to cancel or reschedule your appointment, please contact [facility]  Dept: [phone]  and follow the prompts.  Office hours are 8:00 a.m. to 4:30 p.m. Monday - Friday. Please note  that voicemails left after 4:00 p.m. may not be returned until the following business day.  We are closed weekends and major holidays. You have access to a nurse at all times for urgent questions. Please call the main number to the clinic Dept: [phone] and follow the prompts.   For any non-urgent questions, you may also contact your provider using MyChart. We now offer e-Visits for anyone 52 and older to request care online  for non-urgent symptoms. For details visit [email or web address].   Also download the MyChart app! Go to the app store, search "MyChart", open the app, select [facility], and log in with your MyChart username and password.  Oxaliplatin Injection What is this medication? OXALIPLATIN (ox AL i PLA tin) treats colorectal cancer. It works by slowing down the growth of cancer cells. This medicine may be used for other purposes; ask your health care provider or pharmacist if you have questions. COMMON BRAND NAME(S): Eloxatin What should I tell my care team before I take this medication? They need to know if you have any of these conditions: Heart disease History of irregular heartbeat or rhythm Liver disease Low blood cell levels (white cells, red cells, and platelets) Lung or breathing disease, such as asthma Take medications that treat or prevent blood clots Tingling of the fingers, toes, or other nerve disorder An unusual or allergic reaction to oxaliplatin, other medications, foods, dyes, or preservatives If you or your partner are pregnant or trying to get pregnant Breast-feeding How should I use this medication? This medication is injected into a vein. It is given by your care team in a hospital or clinic setting. Talk to your care team about the use of this medication in children. Special care may be needed. Overdosage: If you think you have taken too much of this medicine contact a poison control center or emergency room at once. NOTE: This medicine is only for you. Do not share this medicine with others. What if I miss a dose? Keep appointments for follow-up doses. It is important not to miss a dose. Call your care team if you are unable to keep an appointment. What may interact with this medication? Do not take this medication with any of the following: Cisapride Dronedarone Pimozide Thioridazine This medication may also interact with the following: Aspirin and aspirin-like  medications Certain medications that treat or prevent blood clots, such as warfarin, apixaban, dabigatran, and rivaroxaban Cisplatin Cyclosporine Diuretics Medications for infection, such as acyclovir, adefovir, amphotericin B, bacitracin, cidofovir, foscarnet, ganciclovir, gentamicin, pentamidine, vancomycin NSAIDs, medications for pain and inflammation, such as ibuprofen or naproxen Other medications that cause heart rhythm changes Pamidronate Zoledronic acid This list may not describe all possible interactions. Give your health care provider a list of all the medicines, herbs, non-prescription drugs, or dietary supplements you use. Also tell them if you smoke, drink alcohol, or use illegal drugs. Some items may interact with your medicine. What should I watch for while using this medication? Your condition will be monitored carefully while you are receiving this medication. You may need blood work while taking this medication. This medication may make you feel generally unwell. This is not uncommon as chemotherapy can affect healthy cells as well as cancer cells. Report any side effects. Continue your course of treatment even though you feel ill unless your care team tells you to stop. This medication may increase your risk of getting an infection. Call your care team for advice if you get a fever, chills, sore throat, or  other symptoms of a cold or flu. Do not treat yourself. Try to avoid being around people who are sick. Avoid taking medications that contain aspirin, acetaminophen, ibuprofen, naproxen, or ketoprofen unless instructed by your care team. These medications may hide a fever. Be careful brushing or flossing your teeth or using a toothpick because you may get an infection or bleed more easily. If you have any dental work done, tell your dentist you are receiving this medication. This medication can make you more sensitive to cold. Do not drink cold drinks or use ice. Cover exposed  skin before coming in contact with cold temperatures or cold objects. When out in cold weather wear warm clothing and cover your mouth and nose to warm the air that goes into your lungs. Tell your care team if you get sensitive to the cold. Talk to your care team if you or your partner are pregnant or think either of you might be pregnant. This medication can cause serious birth defects if taken during pregnancy and for 9 months after the last dose. A negative pregnancy test is required before starting this medication. A reliable form of contraception is recommended while taking this medication and for 9 months after the last dose. Talk to your care team about effective forms of contraception. Do not father a child while taking this medication and for 6 months after the last dose. Use a condom while having sex during this time period. Do not breastfeed while taking this medication and for 3 months after the last dose. This medication may cause infertility. Talk to your care team if you are concerned about your fertility. What side effects may I notice from receiving this medication? Side effects that you should report to your care team as soon as possible: Allergic reactions--skin rash, itching, hives, swelling of the face, lips, tongue, or throat Bleeding--bloody or black, tar-like stools, vomiting blood or brown material that looks like coffee grounds, red or dark brown urine, small red or purple spots on skin, unusual bruising or bleeding Dry cough, shortness of breath or trouble breathing Heart rhythm changes--fast or irregular heartbeat, dizziness, feeling faint or lightheaded, chest pain, trouble breathing Infection--fever, chills, cough, sore throat, wounds that don't heal, pain or trouble when passing urine, general feeling of discomfort or being unwell Liver injury--right upper belly pain, loss of appetite, nausea, light-colored stool, dark yellow or brown urine, yellowing skin or eyes, unusual  weakness or fatigue Low red blood cell level--unusual weakness or fatigue, dizziness, headache, trouble breathing Muscle injury--unusual weakness or fatigue, muscle pain, dark yellow or brown urine, decrease in amount of urine Pain, tingling, or numbness in the hands or feet Sudden and severe headache, confusion, change in vision, seizures, which may be signs of posterior reversible encephalopathy syndrome (PRES) Unusual bruising or bleeding Side effects that usually do not require medical attention (report to your care team if they continue or are bothersome): Diarrhea Nausea Pain, redness, or swelling with sores inside the mouth or throat Unusual weakness or fatigue Vomiting This list may not describe all possible side effects. Call your doctor for medical advice about side effects. You may report side effects to FDA at 1-800-FDA-1088. Where should I keep my medication? This medication is given in a hospital or clinic. It will not be stored at home. NOTE: This sheet is a summary. It may not cover all possible information. If you have questions about this medicine, talk to your doctor, pharmacist, or health care provider.  2023 Elsevier/Gold Standard (  2007-02-20 00:00:00) 

## 2022-06-03 NOTE — Patient Instructions (Signed)

## 2022-06-03 NOTE — Progress Notes (Signed)
[facility] OFFICE PROGRESS NOTE   Diagnosis: Colon cancer  INTERVAL HISTORY:   April [name] returns for follow-up.  She completed cycle 3 FOLFOX 05/12/2022.  Cycle 4 was held 05/26/2022 due to symptomatic hypertension.  She was referred to the emergency department.  Blood pressure improved with treatment.  She was discharged home.  She is seen today prior to proceeding with cycle 4 FOLFOX.  Headaches and chest discomfort have resolved.  No nausea or vomiting.  No diarrhea.  She is no longer having abdominal pain.  Cold sensitivity lasted 3 to 4 days.  No persistent neuropathy symptoms.  Objective:  Vital signs in last 24 hours:  Blood pressure (!) 140/92, pulse 87, temperature 98.1 F (36.7 C), resp. rate 18, height 5\' 4"  (1.626 m), weight 149 lb 12.8 oz (67.9 kg), last menstrual period 01/07/2006, SpO2 100 %.    HEENT: White coating over tongue.  No buccal thrush. Resp: Lungs clear bilaterally. Cardio: Regular rate and rhythm. GI: Abdomen soft and nontender.  No hepatomegaly. Vascular: No leg edema. Neuro: Alert and oriented. Skin: Palms with mild hyperpigmentation. Left upper extremity PICC without erythema.  Lab Results:  Lab Results  Component Value Date   WBC 5.3 06/03/2022   HGB 12.1 06/03/2022   HCT 36.5 06/03/2022   MCV 92.9 06/03/2022   PLT 234 06/03/2022   NEUTROABS 1.7 06/03/2022    Imaging:  No results found.  Medications: I have reviewed the patient's current medications.  Assessment/Plan: Sigmoid colon cancer, stage IV (pT4a,pN2b,M1c) Colonoscopy 03/24/2019-3 rectal polyps-hyperplastic polyps, distal colon biopsy-at least intramucosal adenocarcinoma, completely obstructing mass in the distal sigmoid colon, could not be traversed, intact mismatch repair protein expression 03/24/2019-CEA 56.1 03/29/2019 CT abdomen/pelvis-circumferential thickening involving the entire mid and distal sigmoid colon to the level of the rectosigmoid junction,  solid/cystic mass in the left ovary, bilateral nephrolithiasis Robotic assisted low anterior resection, mesenteric lymphadenectomy, bilateral salpingo-oophorectomy 04/08/2019 Pathology ([facility] review of outside pathology) metastatic adenocarcinoma involving the peritoneum overlying the round ligament, serosa of the urinary bladder, serosa of the right ureter a sacral area, pelvic peritoneum, and left ovary.  Omental biopsy with focal mucin pools with no carcinoma cells identified, right hemidiaphragm biopsy involved by metastatic adenocarcinoma, invasive adenocarcinoma the sigmoid colon, moderately differentiated, T4a, perineural and vascular invasion present, 7/22 lymph nodes, multiple tumor deposits, resection margins negative Negative for PD-L1, low probability of MSI-high, HER-2 negative, negative for BRAF, NRAS and KRAS alterations CTs 05/19/2019-no evidence of metastatic disease, findings suspicious for colitis of the transverse and ascending colon, small amount of ascites in the cul-de-sac, bilateral renal calculi Cycle 1 FOLFIRI 05/24/2019, bevacizumab added with cycle 2 Cycle 4 FOLFIRI/bevacizumab 07/05/2019 Cycle 5 FOLFIRI 07/27/2019, bevacizumab held secondary to hypertension Cycle 6 FOLFIRI 08/10/1999, bevacizumab held secondary to hypertension CTs 08/30/2019-no evidence of recurrent disease, new subsolid right lower lobe nodule felt to be inflammatory, emphysema Cycle 7 FOLFIRI 09/26/2019, bevacizumab remains on hold secondary to hypertension Cycle 8 FOLFIRI 10/17/2019, bevacizumab held secondary to hypertension, Udenyca added for neutropenia Cycle 9 FOLFIRI 11/07/2019, bevacizumab held secondary to hypertension, Udenyca Cycle 10 FOLFIRI 11/28/2019, bevacizumab held, Udenyca Cycle 11 FOLFIRI 12/26/2019, bevacizumab held, Udenyca CTs 01/25/2020-no evidence of recurrent disease, resolution of right lower lobe nodule Maintenance Xeloda beginning 02/06/2020 CTs 04/25/2020- no evidence of metastatic  disease, multiple bilateral renal calculi without hydronephrosis Maintenance Xeloda continued CT abdomen/pelvis without contrast 08/10/2020-bilateral staghorn renal calculi, no evidence of metastatic disease; addendum 08/23/2020-small but increasing omental nodules. Cycle 1 FOLFIRI/panitumumab 09/19/2020 Cycle 2  FOLFIRI/Panitumumab 10/03/2020 Cycle 3 FOLFIRI/Panitumumab 10/17/2020 Cycle 4 FOLFIRI/panitumumab 10/31/2020 Cycle 5 FOLFIRI/Panitumumab 11/14/2020 CTs 11/26/2020-stable omental metastases compared to 10/22/2020, mildly decreased from 08/10/2020.  Left lower quadrant tiny paracolic gutter implant slightly decreased from CT 08/10/2020.  No new or progressive metastatic disease in the abdomen or pelvis. Cycle 6 FOLFIRI/Panitumumab 11/28/2020, irinotecan dose reduced, treatment schedule adjusted to every 3 weeks going forward Cycle 7 FOLFIRI/panitumumab 12/24/2020 Cycle 8 FOLFIRI/Panitumumab 01/16/2021--treatment held due to hypertension in the infusion area. Cycle 8 FOLFIRI/panitumumab 01/21/2021 Cycle 9 FOLFIRI/panitumumab 02/11/2021 Cycle 10 FOLFIRI/Panitumumab 03/04/2021 Cycle 11 FOLFIRI/Panitumumab 03/25/2021 CT abdomen/pelvis 04/10/2021-stable right omental and left posterior paracolic gutter nodules Cycle 12 FOLFIRI/panitumumab 04/15/2021 Cycle 13 FOLFIRI/panitumumab 05/06/2021 Cycle 14 FOLFIRI/Panitumumab 05/27/2021 Cycle 15 FOLFIRI/Panitumumab 06/17/2021 CT abdomen/pelvis 07/05/2021-slight enlargement of a dominant right omental implant, no new implants Patient requested a treatment break CT abdomen/pelvis 10/04/2021-mild increase in size of multiple omental soft tissue nodules, 4 mm calculus in the distal left ureter adjacent to the ureteral stent Cycle 1 Lonsurf 10/14/2021 10/28/2021 Avastin every 2 weeks Cycle 2 Lonsurf 11/11/2021 Cycle 3 Lonsurf 12/09/2021 Cycle 4 Lonsurf 01/06/2022 Avastin held 01/20/2022 due to proteinuria, 24-hour urine 43 mg CT/pelvis 01/30/2022-possible new peritoneal implant  at the transverse colon, no significant change in other omental/peritoneal implants, stable chronic rectal wall thickening, status post removal of double-J left ureteral stent with no evidence of hydronephrosis or ureteral calculus, nonobstructing bilateral renal calculi Cycle 5 Lonsurf 02/05/2022 or 02/06/2022 Avastin every 2 weeks Cycle 6 Lonsurf 03/03/2022 CTs 04/05/2022-increase in size and number of peritoneal implants compared to January 2024 central liver mass narrowing the anterior branch of the right portal vein, decreased biliary duct dilation, biliary stents in place, stable right cardiophrenic lymph nodes, separate pigtail stent in the lumen of the proximal duodenum Cycle 1 FOLFOX 04/14/2022 CT abdomen/pelvis 04/16/2022-stent within the third portion of the duodenum, stable peritoneal nodules, indwelling biliary stents with mild left biliary duct dilation Cycle 2 FOLFOX 04/28/2022, Emend and prophylactic dexamethasone added Cycle 3 FOLFOX 05/12/2022, Aloxi, Emend, prophylactic dexamethasone, Compazine and lorazepam as needed Chemotherapy held 05/25/2020 due to severe hypertension, evaluated in the emergency department Cycle 4 FOLFOX 06/03/2022   Hypertension G4 P3, twins Kidney stones-bilateral staghorn renal calculi on CT 08/10/2020 Infected Port-A-Cath 09/12/2019-placed on Augmentin, referred for Port-A-Cath removal; Port-A-Cath removed 09/14/2019; PICC line placed 09/14/2019; culture staph aureus.  Course of Septra completed. Neutropenia secondary to chemotherapy-Udenyca added with cycle 8 FOLFIRI Right nephrostomy tube 09/12/2020; stent placed 10/09/2020, percutaneous nephrostolithotomy treatment of right-sided kidney stones 10/09/2020, malpositioned right ureter stent replacement 10/23/2020, right ureter stent removed 10/29/2020 Percutaneous left nephrostolithotomy 10/01/2021-no renal stone identified, left ureter stent left in place Cystoscopy 10/14/2021-stent removed 8.  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  Admission 04/16/2022 with nausea/vomiting and abdominal pain, felt to be acute toxicity related to chemotherapy, discharged home 04/18/2022    Disposition: April [name] appears stable.  She has completed 3 cycles of FOLFOX.  Cycle 4 was held last week due to severe hypertension.  She was evaluated in the emergency department and has followed up with her PCP.  Blood pressure is better today.  Plan  to proceed with cycle 4 FOLFOX today as scheduled.  Restaging CTs after cycle 5.  CBC and chemistry panel reviewed.  Labs adequate to proceed as above.  She has persistent hypokalemia.  She is not taking potassium as previously prescribed.  She understands our recommendation to take potassium.  She will return for lab, follow-up, cycle 5 FOLFOX in 2 weeks.  We are available to see her sooner if needed.    April [name] ANP/GNP-BC   06/03/2022  8:45 AM

## 2022-06-04 ENCOUNTER — Inpatient Hospital Stay: Payer: 59

## 2022-06-05 ENCOUNTER — Inpatient Hospital Stay: Payer: 59

## 2022-06-05 VITALS — BP 126/76 | HR 86 | Temp 97.9°F | Resp 18

## 2022-06-05 DIAGNOSIS — C786 Secondary malignant neoplasm of retroperitoneum and peritoneum: Secondary | ICD-10-CM | POA: Diagnosis not present

## 2022-06-05 DIAGNOSIS — C187 Malignant neoplasm of sigmoid colon: Secondary | ICD-10-CM | POA: Diagnosis not present

## 2022-06-05 DIAGNOSIS — C7951 Secondary malignant neoplasm of bone: Secondary | ICD-10-CM | POA: Diagnosis not present

## 2022-06-05 DIAGNOSIS — Z452 Encounter for adjustment and management of vascular access device: Secondary | ICD-10-CM

## 2022-06-05 DIAGNOSIS — C19 Malignant neoplasm of rectosigmoid junction: Secondary | ICD-10-CM

## 2022-06-05 DIAGNOSIS — Z5111 Encounter for antineoplastic chemotherapy: Secondary | ICD-10-CM | POA: Diagnosis not present

## 2022-06-05 DIAGNOSIS — Z79899 Other long term (current) drug therapy: Secondary | ICD-10-CM | POA: Diagnosis not present

## 2022-06-05 MED ORDER — SODIUM CHLORIDE 0.9% FLUSH
10.0000 mL | Freq: Once | INTRAVENOUS | Status: AC
  Administered 2022-06-05: 10 mL via INTRAVENOUS

## 2022-06-05 MED ORDER — SODIUM CHLORIDE 0.9% FLUSH
10.0000 mL | INTRAVENOUS | Status: DC | PRN
  Administered 2022-06-05: 10 mL

## 2022-06-05 MED ORDER — HEPARIN SOD (PORK) LOCK FLUSH 100 UNIT/ML IV SOLN
250.0000 [IU] | Freq: Once | INTRAVENOUS | Status: AC
  Administered 2022-06-05: 250 [IU] via INTRAVENOUS

## 2022-06-05 MED ORDER — HEPARIN SOD (PORK) LOCK FLUSH 100 UNIT/ML IV SOLN
250.0000 [IU] | Freq: Once | INTRAVENOUS | Status: AC | PRN
  Administered 2022-06-05: 250 [IU]

## 2022-06-05 NOTE — Patient Instructions (Signed)
PICC Home Care Guide A peripherally inserted central catheter (PICC) is a form of IV access that allows medicines and IV fluids to be quickly put into the blood and spread throughout the body. The PICC is a long, thin, flexible tube (catheter) that is put into a vein in a person's arm or leg. The catheter ends in a large vein just outside the heart called the superior vena cava (SVC). After the PICC is put in, a chest X-ray may be done to make sure that it is in the right place. A PICC may be placed for different reasons, such as: To give medicines and liquid nutrition. To give IV fluids and blood products. To take blood samples often. If there is trouble placing a peripheral intravenous (PIV) catheter. If cared for properly, a PICC can remain in place for many months. Having a PICC can allow you to go home from the hospital sooner and continue treatment at home. Medicines and PICC care can be managed at home by a family member, caregiver, or home health care team. What are the risks? Generally, having a PICC is safe. However, problems may occur, including: A blood clot (thrombus) forming in or at the end of the PICC. A blood clot forming in a vein (deep vein thrombosis) or traveling to the lung (pulmonary embolism). Inflammation of the vein (phlebitis) in which the PICC is placed. Infection at the insertion site or in the blood. Blood infections from central lines, like PICCs, can be serious and often require a hospital stay. PICC malposition, or PICC movement or poor placement. A break or cut in the PICC. Do not use scissors near the PICC. Nerve or tendon irritation or injury during PICC insertion. How to care for your PICC Please follow the specific guidelines provided by your health care provider. Preventing infection You and any caregivers should wash your hands often with soap and water for at least 20 seconds. Wash hands: Before touching the PICC or the infusion device. Before changing a  bandage (dressing). Do not change the dressing unless you have been taught to do so and have shown you are able to change it safely. Flush the PICC as told. Tell your health care provider right away if the PICC is hard to flush or does not flush. Do not use force to flush the PICC. Use clean and germ-free (sterile) supplies only. Keep the supplies in a dry place. Do not reuse needles, syringes, or any other supplies. Reusing supplies can lead to infection. Keep the PICC dressing dry and secure it with tape if the edges stop sticking to your skin. Check your PICC insertion site every day for signs of infection. Check for: Redness, swelling, or pain. Fluid or blood. Warmth. Pus or a bad smell. Preventing other problems Do not use a syringe that is less than 10 mL to flush the PICC. Do not have your blood pressure checked on the arm in which the PICC is placed. Do not ever pull or tug on the PICC. Keep it secured to your arm with tape or a stretch wrap when not in use. Do not take the PICC out yourself. Only a trained health care provider should remove the PICC. Keep pets and children away from your PICC. How to care for your PICC dressing Keep your PICC dressing clean and dry to prevent infection. Do not take baths, swim, or use a hot tub until your health care provider approves. Ask your health care provider if you can take   showers. You may only be allowed to take sponge baths. When you are allowed to shower: Ask your health care provider to teach you how to wrap the PICC. Cover the PICC with clear plastic wrap and tape to keep it dry while showering. Follow instructions from your health care provider about how to take care of your insertion site and dressing. Make sure you: Wash your hands with soap and water for at least 20 seconds before and after you change your dressing. If soap and water are not available, use hand sanitizer. Change your dressing only if taught to do so by your health care  provider. Your PICC dressing needs to be changed if it becomes loose or wet. Leave stitches (sutures), skin glue, or adhesive strips in place. These skin closures may need to stay in place for 2 weeks or longer. If adhesive strip edges start to loosen and curl up, you may trim the loose edges. Do not remove adhesive strips completely unless your health care provider tells you to do that. Follow these instructions at home: Disposal of supplies Throw away any syringes in a disposal container that is meant for sharp items (sharps container). You can buy a sharps container from a pharmacy, or you can make one by using an empty, hard plastic bottle with a lid. Place any used dressings or infusion bags into a plastic bag. Throw that bag in the trash. General instructions  Always carry your PICC identification card or wear a medical alert bracelet. Keep the tube clamped at all times, unless it is being used. Always carry a smooth-edge clamp with you to clamp the PICC if it breaks. Do not use scissors or sharp objects near the tube. You may bend your arm and move it freely. If your PICC is near or at the bend of your elbow, avoid activity with repeated motion at the elbow. Avoid lifting heavy objects as told by your health care provider. Keep all follow-up visits. This is important. You will need to have your PICC dressing changed at least once a week. Contact a health care provider if: You have pain in your arm, ear, face, or teeth. You have a fever or chills. You have redness, swelling, or pain around the insertion site. You have fluid or blood coming from the insertion site. Your insertion site feels warm to the touch. You have pus or a bad smell coming from the insertion site. Your skin feels hard and raised around the insertion site. Your PICC dressing has gotten wet or is coming off and you have not been taught how to change it. Get help right away if: You have problems with your PICC, such as  your PICC: Was tugged or pulled and has partially come out. Do not  push the PICC back in. Cannot be flushed, is hard to flush, or leaks around the insertion site when it is flushed. Makes a flushing sound when it is flushed. Appears to have a hole or tear. Is accidentally pulled all the way out. If this happens, cover the insertion site with a gauze dressing. Do not throw the PICC away. Your health care provider will need to check it to be sure the entire catheter came out. You feel your heart racing or skipping beats, or you have chest pain. You have shortness of breath or trouble breathing. You have swelling, redness, warmth, or pain in the arm in which the PICC is placed. You have a red streak going up your arm that   starts under the PICC dressing. These symptoms may be an emergency. Get help right away. Call 911. Do not wait to see if the symptoms will go away. Do not drive yourself to the hospital. Summary A peripherally inserted central catheter (PICC) is a long, thin, flexible tube (catheter) that is put into a vein in the arm or leg. If cared for properly, a PICC can remain in place for many months. Having a PICC can allow you to go home from the hospital sooner and continue treatment at home. The PICC is inserted using a germ-free (sterile) technique by a specially trained health care provider. Only a trained health care provider should remove it. Do not have your blood pressure checked on the arm in which your PICC is placed. Always keep your PICC identification card with you. This information is not intended to replace advice given to you by your health care provider. Make sure you discuss any questions you have with your health care provider. Document Revised: 07/18/2020 Document Reviewed: 07/18/2020 Elsevier Patient Education  2023 Elsevier Inc.  

## 2022-06-09 ENCOUNTER — Other Ambulatory Visit: Payer: Self-pay | Admitting: Oncology

## 2022-06-09 DIAGNOSIS — C19 Malignant neoplasm of rectosigmoid junction: Secondary | ICD-10-CM

## 2022-06-10 ENCOUNTER — Inpatient Hospital Stay: Payer: 59

## 2022-06-10 ENCOUNTER — Inpatient Hospital Stay: Payer: 59 | Admitting: Oncology

## 2022-06-12 ENCOUNTER — Inpatient Hospital Stay: Payer: 59

## 2022-06-12 VITALS — BP 131/99 | HR 97 | Temp 97.5°F | Ht 64.0 in | Wt 148.1 lb

## 2022-06-12 DIAGNOSIS — C7951 Secondary malignant neoplasm of bone: Secondary | ICD-10-CM | POA: Diagnosis not present

## 2022-06-12 DIAGNOSIS — C786 Secondary malignant neoplasm of retroperitoneum and peritoneum: Secondary | ICD-10-CM | POA: Diagnosis not present

## 2022-06-12 DIAGNOSIS — Z79899 Other long term (current) drug therapy: Secondary | ICD-10-CM | POA: Diagnosis not present

## 2022-06-12 DIAGNOSIS — Z5111 Encounter for antineoplastic chemotherapy: Secondary | ICD-10-CM | POA: Diagnosis not present

## 2022-06-12 DIAGNOSIS — C20 Malignant neoplasm of rectum: Secondary | ICD-10-CM

## 2022-06-12 DIAGNOSIS — Z95828 Presence of other vascular implants and grafts: Secondary | ICD-10-CM

## 2022-06-12 DIAGNOSIS — C187 Malignant neoplasm of sigmoid colon: Secondary | ICD-10-CM | POA: Diagnosis not present

## 2022-06-12 MED ORDER — HEPARIN SOD (PORK) LOCK FLUSH 100 UNIT/ML IV SOLN
500.0000 [IU] | Freq: Once | INTRAVENOUS | Status: AC
  Administered 2022-06-12: 500 [IU]

## 2022-06-12 MED ORDER — SODIUM CHLORIDE 0.9% FLUSH
10.0000 mL | Freq: Once | INTRAVENOUS | Status: AC
  Administered 2022-06-12: 10 mL

## 2022-06-12 NOTE — Patient Instructions (Signed)
PICC Home Care Guide A peripherally inserted central catheter (PICC) is a form of IV access that allows medicines and IV fluids to be quickly put into the blood and spread throughout the body. The PICC is a long, thin, flexible tube (catheter) that is put into a vein in a person's arm or leg. The catheter ends in a large vein just outside the heart called the superior vena cava (SVC). After the PICC is put in, a chest X-ray may be done to make sure that it is in the right place. A PICC may be placed for different reasons, such as: To give medicines and liquid nutrition. To give IV fluids and blood products. To take blood samples often. If there is trouble placing a peripheral intravenous (PIV) catheter. If cared for properly, a PICC can remain in place for many months. Having a PICC can allow you to go home from the hospital sooner and continue treatment at home. Medicines and PICC care can be managed at home by a family member, caregiver, or home health care team. What are the risks? Generally, having a PICC is safe. However, problems may occur, including: A blood clot (thrombus) forming in or at the end of the PICC. A blood clot forming in a vein (deep vein thrombosis) or traveling to the lung (pulmonary embolism). Inflammation of the vein (phlebitis) in which the PICC is placed. Infection at the insertion site or in the blood. Blood infections from central lines, like PICCs, can be serious and often require a hospital stay. PICC malposition, or PICC movement or poor placement. A break or cut in the PICC. Do not use scissors near the PICC. Nerve or tendon irritation or injury during PICC insertion. How to care for your PICC Please follow the specific guidelines provided by your health care provider. Preventing infection You and any caregivers should wash your hands often with soap and water for at least 20 seconds. Wash hands: Before touching the PICC or the infusion device. Before changing a  bandage (dressing). Do not change the dressing unless you have been taught to do so and have shown you are able to change it safely. Flush the PICC as told. Tell your health care provider right away if the PICC is hard to flush or does not flush. Do not use force to flush the PICC. Use clean and germ-free (sterile) supplies only. Keep the supplies in a dry place. Do not reuse needles, syringes, or any other supplies. Reusing supplies can lead to infection. Keep the PICC dressing dry and secure it with tape if the edges stop sticking to your skin. Check your PICC insertion site every day for signs of infection. Check for: Redness, swelling, or pain. Fluid or blood. Warmth. Pus or a bad smell. Preventing other problems Do not use a syringe that is less than 10 mL to flush the PICC. Do not have your blood pressure checked on the arm in which the PICC is placed. Do not ever pull or tug on the PICC. Keep it secured to your arm with tape or a stretch wrap when not in use. Do not take the PICC out yourself. Only a trained health care provider should remove the PICC. Keep pets and children away from your PICC. How to care for your PICC dressing Keep your PICC dressing clean and dry to prevent infection. Do not take baths, swim, or use a hot tub until your health care provider approves. Ask your health care provider if you can take  showers. You may only be allowed to take sponge baths. When you are allowed to shower: Ask your health care provider to teach you how to wrap the PICC. Cover the PICC with clear plastic wrap and tape to keep it dry while showering. Follow instructions from your health care provider about how to take care of your insertion site and dressing. Make sure you: Wash your hands with soap and water for at least 20 seconds before and after you change your dressing. If soap and water are not available, use hand sanitizer. Change your dressing only if taught to do so by your health care  provider. Your PICC dressing needs to be changed if it becomes loose or wet. Leave stitches (sutures), skin glue, or adhesive strips in place. These skin closures may need to stay in place for 2 weeks or longer. If adhesive strip edges start to loosen and curl up, you may trim the loose edges. Do not remove adhesive strips completely unless your health care provider tells you to do that. Follow these instructions at home: Disposal of supplies Throw away any syringes in a disposal container that is meant for sharp items (sharps container). You can buy a sharps container from a pharmacy, or you can make one by using an empty, hard plastic bottle with a lid. Place any used dressings or infusion bags into a plastic bag. Throw that bag in the trash. General instructions  Always carry your PICC identification card or wear a medical alert bracelet. Keep the tube clamped at all times, unless it is being used. Always carry a smooth-edge clamp with you to clamp the PICC if it breaks. Do not use scissors or sharp objects near the tube. You may bend your arm and move it freely. If your PICC is near or at the bend of your elbow, avoid activity with repeated motion at the elbow. Avoid lifting heavy objects as told by your health care provider. Keep all follow-up visits. This is important. You will need to have your PICC dressing changed at least once a week. Contact a health care provider if: You have pain in your arm, ear, face, or teeth. You have a fever or chills. You have redness, swelling, or pain around the insertion site. You have fluid or blood coming from the insertion site. Your insertion site feels warm to the touch. You have pus or a bad smell coming from the insertion site. Your skin feels hard and raised around the insertion site. Your PICC dressing has gotten wet or is coming off and you have not been taught how to change it. Get help right away if: You have problems with your PICC, such as  your PICC: Was tugged or pulled and has partially come out. Do not  push the PICC back in. Cannot be flushed, is hard to flush, or leaks around the insertion site when it is flushed. Makes a flushing sound when it is flushed. Appears to have a hole or tear. Is accidentally pulled all the way out. If this happens, cover the insertion site with a gauze dressing. Do not throw the PICC away. Your health care provider will need to check it to be sure the entire catheter came out. You feel your heart racing or skipping beats, or you have chest pain. You have shortness of breath or trouble breathing. You have swelling, redness, warmth, or pain in the arm in which the PICC is placed. You have a red streak going up your arm that  starts under the PICC dressing. These symptoms may be an emergency. Get help right away. Call 911. Do not wait to see if the symptoms will go away. Do not drive yourself to the hospital. Summary A peripherally inserted central catheter (PICC) is a long, thin, flexible tube (catheter) that is put into a vein in the arm or leg. If cared for properly, a PICC can remain in place for many months. Having a PICC can allow you to go home from the hospital sooner and continue treatment at home. The PICC is inserted using a germ-free (sterile) technique by a specially trained health care provider. Only a trained health care provider should remove it. Do not have your blood pressure checked on the arm in which your PICC is placed. Always keep your PICC identification card with you. This information is not intended to replace advice given to you by your health care provider. Make sure you discuss any questions you have with your health care provider. Document Revised: 07/18/2020 Document Reviewed: 07/18/2020 Elsevier Patient Education  2024 [employer].

## 2022-06-13 ENCOUNTER — Ambulatory Visit: Payer: 59 | Admitting: Nurse Practitioner

## 2022-06-14 DIAGNOSIS — C20 Malignant neoplasm of rectum: Secondary | ICD-10-CM | POA: Diagnosis not present

## 2022-06-16 ENCOUNTER — Inpatient Hospital Stay: Payer: 59

## 2022-06-16 ENCOUNTER — Inpatient Hospital Stay (HOSPITAL_BASED_OUTPATIENT_CLINIC_OR_DEPARTMENT_OTHER): Payer: 59 | Admitting: Nurse Practitioner

## 2022-06-16 ENCOUNTER — Inpatient Hospital Stay: Payer: 59 | Attending: Oncology

## 2022-06-16 ENCOUNTER — Encounter: Payer: Self-pay | Admitting: Nurse Practitioner

## 2022-06-16 VITALS — BP 146/92

## 2022-06-16 VITALS — BP 142/90 | HR 91 | Temp 98.1°F | Resp 18 | Ht 64.0 in | Wt 149.4 lb

## 2022-06-16 DIAGNOSIS — Z5111 Encounter for antineoplastic chemotherapy: Secondary | ICD-10-CM | POA: Diagnosis not present

## 2022-06-16 DIAGNOSIS — C187 Malignant neoplasm of sigmoid colon: Secondary | ICD-10-CM | POA: Diagnosis not present

## 2022-06-16 DIAGNOSIS — C19 Malignant neoplasm of rectosigmoid junction: Secondary | ICD-10-CM | POA: Diagnosis not present

## 2022-06-16 DIAGNOSIS — C20 Malignant neoplasm of rectum: Secondary | ICD-10-CM | POA: Diagnosis not present

## 2022-06-16 DIAGNOSIS — Z79899 Other long term (current) drug therapy: Secondary | ICD-10-CM | POA: Diagnosis not present

## 2022-06-16 DIAGNOSIS — C786 Secondary malignant neoplasm of retroperitoneum and peritoneum: Secondary | ICD-10-CM

## 2022-06-16 DIAGNOSIS — Z452 Encounter for adjustment and management of vascular access device: Secondary | ICD-10-CM

## 2022-06-16 DIAGNOSIS — Z5189 Encounter for other specified aftercare: Secondary | ICD-10-CM | POA: Insufficient documentation

## 2022-06-16 DIAGNOSIS — R519 Headache, unspecified: Secondary | ICD-10-CM

## 2022-06-16 LAB — CMP (CANCER CENTER ONLY)
ALT: 8 U/L (ref 0–44)
AST: 16 U/L (ref 15–41)
Albumin: 4 g/dL (ref 3.5–5.0)
Alkaline Phosphatase: 105 U/L (ref 38–126)
Anion gap: 8 (ref 5–15)
BUN: 5 mg/dL — ABNORMAL LOW (ref 6–20)
CO2: 30 mmol/L (ref 22–32)
Calcium: 9.4 mg/dL (ref 8.9–10.3)
Chloride: 101 mmol/L (ref 98–111)
Creatinine: 0.55 mg/dL (ref 0.44–1.00)
GFR, Estimated: >60 mL/min (ref >60)
Glucose, Bld: 92 mg/dL (ref 70–99)
Potassium: 3.4 mmol/L — ABNORMAL LOW (ref 3.5–5.1)
Sodium: 139 mmol/L (ref 135–145)
Total Bilirubin: 0.2 mg/dL — ABNORMAL LOW (ref 0.3–1.2)
Total Protein: 7.3 g/dL (ref 6.5–8.1)

## 2022-06-16 LAB — CBC WITH DIFFERENTIAL (CANCER CENTER ONLY)
Abs Immature Granulocytes: 0.01 10*3/uL (ref 0.00–0.07)
Basophils Absolute: 0 10*3/uL (ref 0.0–0.1)
Basophils Relative: 1 %
Eosinophils Absolute: 0.4 10*3/uL (ref 0.0–0.5)
Eosinophils Relative: 8 %
HCT: 36.5 % (ref 36.0–46.0)
Hemoglobin: 12.3 g/dL (ref 12.0–15.0)
Immature Granulocytes: 0 %
Lymphocytes Relative: 53 %
Lymphs Abs: 2.6 10*3/uL (ref 0.7–4.0)
MCH: 30.6 pg (ref 26.0–34.0)
MCHC: 33.7 g/dL (ref 30.0–36.0)
MCV: 90.8 fL (ref 80.0–100.0)
Monocytes Absolute: 0.5 10*3/uL (ref 0.1–1.0)
Monocytes Relative: 9 %
Neutro Abs: 1.4 10*3/uL — ABNORMAL LOW (ref 1.7–7.7)
Neutrophils Relative %: 29 %
Platelet Count: 224 10*3/uL (ref 150–400)
RBC: 4.02 MIL/uL (ref 3.87–5.11)
RDW: 14.5 % (ref 11.5–15.5)
WBC Count: 4.9 10*3/uL (ref 4.0–10.5)
nRBC: 0 % (ref 0.0–0.2)

## 2022-06-16 LAB — CEA (ACCESS): CEA (CHCC): 4.15 ng/mL (ref 0.00–5.00)

## 2022-06-16 MED ORDER — SODIUM CHLORIDE 0.9 % IV SOLN
150.0000 mg | Freq: Once | INTRAVENOUS | Status: AC
  Administered 2022-06-16: 150 mg via INTRAVENOUS
  Filled 2022-06-16: qty 150

## 2022-06-16 MED ORDER — SODIUM CHLORIDE 0.9 % IV SOLN
1800.0000 mg/m2 | INTRAVENOUS | Status: DC
  Administered 2022-06-16: 3500 mg via INTRAVENOUS
  Filled 2022-06-16: qty 70

## 2022-06-16 MED ORDER — SODIUM CHLORIDE 0.9% FLUSH
10.0000 mL | INTRAVENOUS | Status: DC | PRN
  Administered 2022-06-16: 10 mL

## 2022-06-16 MED ORDER — OXALIPLATIN CHEMO INJECTION 100 MG/20ML
65.0000 mg/m2 | Freq: Once | INTRAVENOUS | Status: AC
  Administered 2022-06-16: 115 mg via INTRAVENOUS
  Filled 2022-06-16: qty 20

## 2022-06-16 MED ORDER — PALONOSETRON HCL INJECTION 0.25 MG/5ML
0.2500 mg | Freq: Once | INTRAVENOUS | Status: AC
  Administered 2022-06-16: 0.25 mg via INTRAVENOUS
  Filled 2022-06-16: qty 5

## 2022-06-16 MED ORDER — ALTEPLASE 2 MG IJ SOLR
2.0000 mg | Freq: Once | INTRAMUSCULAR | Status: AC
  Administered 2022-06-16: 2 mg
  Filled 2022-06-16: qty 2

## 2022-06-16 MED ORDER — SODIUM CHLORIDE 0.9 % IV SOLN
10.0000 mg | Freq: Once | INTRAVENOUS | Status: AC
  Administered 2022-06-16: 10 mg via INTRAVENOUS
  Filled 2022-06-16: qty 10

## 2022-06-16 MED ORDER — LEUCOVORIN CALCIUM INJECTION 350 MG
400.0000 mg/m2 | Freq: Once | INTRAVENOUS | Status: AC
  Administered 2022-06-16: 704 mg via INTRAVENOUS
  Filled 2022-06-16: qty 35.2

## 2022-06-16 MED ORDER — ACETAMINOPHEN 325 MG PO TABS
325.0000 mg | ORAL_TABLET | Freq: Once | ORAL | Status: AC
  Administered 2022-06-16: 325 mg via ORAL
  Filled 2022-06-16: qty 1

## 2022-06-16 MED ORDER — DEXTROSE 5 % IV SOLN: Freq: Once | INTRAVENOUS | Status: AC

## 2022-06-16 NOTE — Patient Instructions (Addendum)
[facility]   The chemotherapy medication bag should finish at 46 hours, 96 hours, or 7 days. For example, if your pump is scheduled for 46 hours and it was put on at 4:00 p.m., it should finish at 2:00 p.m. the day it is scheduled to come off regardless of your appointment time.     Estimated time to finish at 1pm, Wednesday June 18, 2022.   If the display on your pump reads "Low Volume" and it is beeping, take the batteries out of the pump and come to the cancer center for it to be taken off.   If the pump alarms go off prior to the pump reading "Low Volume" then call [phone] and someone can assist you.  If the plunger comes out and the chemotherapy medication is leaking out, please use your home chemo spill kit to clean up the spill. Do NOT use paper towels or other household products.  If you have problems or questions regarding your pump, please call either [phone] (24 hours a day) or the cancer center Monday-Friday 8:00 a.m.- 4:30 p.m. at the clinic number and we will assist you. If you are unable to get assistance, then go to the nearest Emergency Department and ask the staff to contact the IV team for assistance.  Discharge Instructions: Thank you for choosing [facility] to provide your oncology and hematology care.   If you have a lab appointment with the Cancer Center, please go directly to the Cancer Center and check in at the registration area.   Wear comfortable clothing and clothing appropriate for easy access to any Portacath or PICC line.   We strive to give you quality time with your provider. You may need to reschedule your appointment if you arrive late (15 or more minutes).  Arriving late affects you and other patients whose appointments are after yours.  Also, if you miss three or more appointments without notifying the office, you may be dismissed from the clinic at the provider's discretion.      For  prescription refill requests, have your pharmacy contact our office and allow 72 hours for refills to be completed.    Today you received the following chemotherapy and/or immunotherapy agents Oxaliplatin, Leucovorin, Fluorouracil.      To help prevent nausea and vomiting after your treatment, we encourage you to take your nausea medication as directed.  BELOW ARE SYMPTOMS THAT SHOULD BE REPORTED IMMEDIATELY: *FEVER GREATER THAN 100.4 F (38 C) OR HIGHER *CHILLS OR SWEATING *NAUSEA AND VOMITING THAT IS NOT CONTROLLED WITH YOUR NAUSEA MEDICATION *UNUSUAL SHORTNESS OF BREATH *UNUSUAL BRUISING OR BLEEDING *URINARY PROBLEMS (pain or burning when urinating, or frequent urination) *BOWEL PROBLEMS (unusual diarrhea, constipation, pain near the anus) TENDERNESS IN MOUTH AND THROAT WITH OR WITHOUT PRESENCE OF ULCERS (sore throat, sores in mouth, or a toothache) UNUSUAL RASH, SWELLING OR PAIN  UNUSUAL VAGINAL DISCHARGE OR ITCHING   Items with * indicate a potential emergency and should be followed up as soon as possible or go to the Emergency Department if any problems should occur.  Please show the CHEMOTHERAPY ALERT CARD or IMMUNOTHERAPY ALERT CARD at check-in to the Emergency Department and triage nurse.  Should you have questions after your visit or need to cancel or reschedule your appointment, please contact [facility]  Dept: [phone]  and follow the prompts.  Office hours are 8:00 a.m. to 4:30 p.m. Monday - Friday. Please note  that voicemails left after 4:00 p.m. may not be returned until the following business day.  We are closed weekends and major holidays. You have access to a nurse at all times for urgent questions. Please call the main number to the clinic Dept: [phone] and follow the prompts.   For any non-urgent questions, you may also contact your provider using MyChart. We now offer e-Visits for anyone 1 and older to request care online  for non-urgent symptoms. For details visit [email or web address].   Also download the MyChart app! Go to the app store, search "MyChart", open the app, select [place], and log in with your MyChart username and password.  Oxaliplatin Injection What is this medication? OXALIPLATIN (ox AL i PLA tin) treats colorectal cancer. It works by slowing down the growth of cancer cells. This medicine may be used for other purposes; ask your health care provider or pharmacist if you have questions. COMMON BRAND NAME(S): Eloxatin What should I tell my care team before I take this medication? They need to know if you have any of these conditions: Heart disease History of irregular heartbeat or rhythm Liver disease Low blood cell levels (white cells, red cells, and platelets) Lung or breathing disease, such as asthma Take medications that treat or prevent blood clots Tingling of the fingers, toes, or other nerve disorder An unusual or allergic reaction to oxaliplatin, other medications, foods, dyes, or preservatives If you or your partner are pregnant or trying to get pregnant Breast-feeding How should I use this medication? This medication is injected into a vein. It is given by your care team in a hospital or clinic setting. Talk to your care team about the use of this medication in children. Special care may be needed. Overdosage: If you think you have taken too much of this medicine contact a poison control center or emergency room at once. NOTE: This medicine is only for you. Do not share this medicine with others. What if I miss a dose? Keep appointments for follow-up doses. It is important not to miss a dose. Call your care team if you are unable to keep an appointment. What may interact with this medication? Do not take this medication with any of the following: Cisapride Dronedarone Pimozide Thioridazine This medication may also interact with the following: Aspirin and aspirin-like  medications Certain medications that treat or prevent blood clots, such as warfarin, apixaban, dabigatran, and rivaroxaban Cisplatin Cyclosporine Diuretics Medications for infection, such as acyclovir, adefovir, amphotericin B, bacitracin, cidofovir, foscarnet, ganciclovir, gentamicin, pentamidine, vancomycin NSAIDs, medications for pain and inflammation, such as ibuprofen or naproxen Other medications that cause heart rhythm changes Pamidronate Zoledronic acid This list may not describe all possible interactions. Give your health care provider a list of all the medicines, herbs, non-prescription drugs, or dietary supplements you use. Also tell them if you smoke, drink alcohol, or use illegal drugs. Some items may interact with your medicine. What should I watch for while using this medication? Your condition will be monitored carefully while you are receiving this medication. You may need blood work while taking this medication. This medication may make you feel generally unwell. This is not uncommon as chemotherapy can affect healthy cells as well as cancer cells. Report any side effects. Continue your course of treatment even though you feel ill unless your care team tells you to stop. This medication may increase your risk of getting an infection. Call your care team for advice if you get a fever, chills, sore throat, or  other symptoms of a cold or flu. Do not treat yourself. Try to avoid being around people who are sick. Avoid taking medications that contain aspirin, acetaminophen, ibuprofen, naproxen, or ketoprofen unless instructed by your care team. These medications may hide a fever. Be careful brushing or flossing your teeth or using a toothpick because you may get an infection or bleed more easily. If you have any dental work done, tell your dentist you are receiving this medication. This medication can make you more sensitive to cold. Do not drink cold drinks or use ice. Cover exposed  skin before coming in contact with cold temperatures or cold objects. When out in cold weather wear warm clothing and cover your mouth and nose to warm the air that goes into your lungs. Tell your care team if you get sensitive to the cold. Talk to your care team if you or your partner are pregnant or think either of you might be pregnant. This medication can cause serious birth defects if taken during pregnancy and for 9 months after the last dose. A negative pregnancy test is required before starting this medication. A reliable form of contraception is recommended while taking this medication and for 9 months after the last dose. Talk to your care team about effective forms of contraception. Do not father a child while taking this medication and for 6 months after the last dose. Use a condom while having sex during this time period. Do not breastfeed while taking this medication and for 3 months after the last dose. This medication may cause infertility. Talk to your care team if you are concerned about your fertility. What side effects may I notice from receiving this medication? Side effects that you should report to your care team as soon as possible: Allergic reactions--skin rash, itching, hives, swelling of the face, lips, tongue, or throat Bleeding--bloody or black, tar-like stools, vomiting blood or brown material that looks like coffee grounds, red or dark brown urine, small red or purple spots on skin, unusual bruising or bleeding Dry cough, shortness of breath or trouble breathing Heart rhythm changes--fast or irregular heartbeat, dizziness, feeling faint or lightheaded, chest pain, trouble breathing Infection--fever, chills, cough, sore throat, wounds that don't heal, pain or trouble when passing urine, general feeling of discomfort or being unwell Liver injury--right upper belly pain, loss of appetite, nausea, light-colored stool, dark yellow or brown urine, yellowing skin or eyes, unusual  weakness or fatigue Low red blood cell level--unusual weakness or fatigue, dizziness, headache, trouble breathing Muscle injury--unusual weakness or fatigue, muscle pain, dark yellow or brown urine, decrease in amount of urine Pain, tingling, or numbness in the hands or feet Sudden and severe headache, confusion, change in vision, seizures, which may be signs of posterior reversible encephalopathy syndrome (PRES) Unusual bruising or bleeding Side effects that usually do not require medical attention (report to your care team if they continue or are bothersome): Diarrhea Nausea Pain, redness, or swelling with sores inside the mouth or throat Unusual weakness or fatigue Vomiting This list may not describe all possible side effects. Call your doctor for medical advice about side effects. You may report side effects to FDA at 1-800-FDA-1088. Where should I keep my medication? This medication is given in a hospital or clinic. It will not be stored at home. NOTE: This sheet is a summary. It may not cover all possible information. If you have questions about this medicine, talk to your doctor, pharmacist, or health care provider.  2023 Elsevier/Gold Standard (  2007-02-20 00:00:00) 

## 2022-06-16 NOTE — Progress Notes (Signed)
Patient seen by Lonna Cobb NP today  Vitals are within treatment parameters.  Labs reviewed by Lonna Cobb NP and are not all within treatment parameters. ANC 1.4  Per physician team, patient is ready for treatment. Please note that modifications are being made to the treatment plan including dose reduction.

## 2022-06-16 NOTE — Patient Instructions (Signed)
PICC Home Care Guide A peripherally inserted central catheter (PICC) is a form of IV access that allows medicines and IV fluids to be quickly put into the blood and spread throughout the body. The PICC is a long, thin, flexible tube (catheter) that is put into a vein in a person's arm or leg. The catheter ends in a large vein just outside the heart called the superior vena cava (SVC). After the PICC is put in, a chest X-ray may be done to make sure that it is in the right place. A PICC may be placed for different reasons, such as: To give medicines and liquid nutrition. To give IV fluids and blood products. To take blood samples often. If there is trouble placing a peripheral intravenous (PIV) catheter. If cared for properly, a PICC can remain in place for many months. Having a PICC can allow you to go home from the hospital sooner and continue treatment at home. Medicines and PICC care can be managed at home by a family member, caregiver, or home health care team. What are the risks? Generally, having a PICC is safe. However, problems may occur, including: A blood clot (thrombus) forming in or at the end of the PICC. A blood clot forming in a vein (deep vein thrombosis) or traveling to the lung (pulmonary embolism). Inflammation of the vein (phlebitis) in which the PICC is placed. Infection at the insertion site or in the blood. Blood infections from central lines, like PICCs, can be serious and often require a hospital stay. PICC malposition, or PICC movement or poor placement. A break or cut in the PICC. Do not use scissors near the PICC. Nerve or tendon irritation or injury during PICC insertion. How to care for your PICC Please follow the specific guidelines provided by your health care provider. Preventing infection You and any caregivers should wash your hands often with soap and water for at least 20 seconds. Wash hands: Before touching the PICC or the infusion device. Before changing a  bandage (dressing). Do not change the dressing unless you have been taught to do so and have shown you are able to change it safely. Flush the PICC as told. Tell your health care provider right away if the PICC is hard to flush or does not flush. Do not use force to flush the PICC. Use clean and germ-free (sterile) supplies only. Keep the supplies in a dry place. Do not reuse needles, syringes, or any other supplies. Reusing supplies can lead to infection. Keep the PICC dressing dry and secure it with tape if the edges stop sticking to your skin. Check your PICC insertion site every day for signs of infection. Check for: Redness, swelling, or pain. Fluid or blood. Warmth. Pus or a bad smell. Preventing other problems Do not use a syringe that is less than 10 mL to flush the PICC. Do not have your blood pressure checked on the arm in which the PICC is placed. Do not ever pull or tug on the PICC. Keep it secured to your arm with tape or a stretch wrap when not in use. Do not take the PICC out yourself. Only a trained health care provider should remove the PICC. Keep pets and children away from your PICC. How to care for your PICC dressing Keep your PICC dressing clean and dry to prevent infection. Do not take baths, swim, or use a hot tub until your health care provider approves. Ask your health care provider if you can take   showers. You may only be allowed to take sponge baths. When you are allowed to shower: Ask your health care provider to teach you how to wrap the PICC. Cover the PICC with clear plastic wrap and tape to keep it dry while showering. Follow instructions from your health care provider about how to take care of your insertion site and dressing. Make sure you: Wash your hands with soap and water for at least 20 seconds before and after you change your dressing. If soap and water are not available, use hand sanitizer. Change your dressing only if taught to do so by your health care  provider. Your PICC dressing needs to be changed if it becomes loose or wet. Leave stitches (sutures), skin glue, or adhesive strips in place. These skin closures may need to stay in place for 2 weeks or longer. If adhesive strip edges start to loosen and curl up, you may trim the loose edges. Do not remove adhesive strips completely unless your health care provider tells you to do that. Follow these instructions at home: Disposal of supplies Throw away any syringes in a disposal container that is meant for sharp items (sharps container). You can buy a sharps container from a pharmacy, or you can make one by using an empty, hard plastic bottle with a lid. Place any used dressings or infusion bags into a plastic bag. Throw that bag in the trash. General instructions  Always carry your PICC identification card or wear a medical alert bracelet. Keep the tube clamped at all times, unless it is being used. Always carry a smooth-edge clamp with you to clamp the PICC if it breaks. Do not use scissors or sharp objects near the tube. You may bend your arm and move it freely. If your PICC is near or at the bend of your elbow, avoid activity with repeated motion at the elbow. Avoid lifting heavy objects as told by your health care provider. Keep all follow-up visits. This is important. You will need to have your PICC dressing changed at least once a week. Contact a health care provider if: You have pain in your arm, ear, face, or teeth. You have a fever or chills. You have redness, swelling, or pain around the insertion site. You have fluid or blood coming from the insertion site. Your insertion site feels warm to the touch. You have pus or a bad smell coming from the insertion site. Your skin feels hard and raised around the insertion site. Your PICC dressing has gotten wet or is coming off and you have not been taught how to change it. Get help right away if: You have problems with your PICC, such as  your PICC: Was tugged or pulled and has partially come out. Do not  push the PICC back in. Cannot be flushed, is hard to flush, or leaks around the insertion site when it is flushed. Makes a flushing sound when it is flushed. Appears to have a hole or tear. Is accidentally pulled all the way out. If this happens, cover the insertion site with a gauze dressing. Do not throw the PICC away. Your health care provider will need to check it to be sure the entire catheter came out. You feel your heart racing or skipping beats, or you have chest pain. You have shortness of breath or trouble breathing. You have swelling, redness, warmth, or pain in the arm in which the PICC is placed. You have a red streak going up your arm that   starts under the PICC dressing. These symptoms may be an emergency. Get help right away. Call 911. Do not wait to see if the symptoms will go away. Do not drive yourself to the hospital. Summary A peripherally inserted central catheter (PICC) is a long, thin, flexible tube (catheter) that is put into a vein in the arm or leg. If cared for properly, a PICC can remain in place for many months. Having a PICC can allow you to go home from the hospital sooner and continue treatment at home. The PICC is inserted using a germ-free (sterile) technique by a specially trained health care provider. Only a trained health care provider should remove it. Do not have your blood pressure checked on the arm in which your PICC is placed. Always keep your PICC identification card with you. This information is not intended to replace advice given to you by your health care provider. Make sure you discuss any questions you have with your health care provider. Document Revised: 07/18/2020 Document Reviewed: 07/18/2020 Elsevier Patient Education  2024 [employer]  

## 2022-06-16 NOTE — Progress Notes (Signed)
[facility] OFFICE PROGRESS NOTE   Diagnosis: Colon cancer  INTERVAL HISTORY:   Ms. Piercefield returns as scheduled.  She completed cycle 4 FOLFOX 06/03/2022.  She had nausea for 2 to 3 days after treatment.  No vomiting.  She feels the nausea is due to indigestion.  No mouth sores.  No diarrhea.  Cold sensitivity lasted 3 to 4 days.  No persistent neuropathy symptoms.  Objective:  Vital signs in last 24 hours:  Blood pressure (!) 142/90, pulse 91, temperature 98.1 F (36.7 C), temperature source Oral, resp. rate 18, height 5\' 4"  (1.626 m), weight 149 lb 6.4 oz (67.8 kg), last menstrual period 01/07/2006, SpO2 100 %.    HEENT: White coating over tongue.  No buccal thrush. Resp: Lungs clear bilaterally. Cardio: Regular rate and rhythm. GI: Abdomen is soft, mild generalized tenderness.  No hepatosplenomegaly. Vascular: No leg edema. Neuro: Vibratory sense intact over the fingertips per tuning fork exam. Skin: Palms without erythema. Left upper extremity PICC without erythema.   Lab Results:  Lab Results  Component Value Date   WBC 4.9 06/16/2022   HGB 12.3 06/16/2022   HCT 36.5 06/16/2022   MCV 90.8 06/16/2022   PLT 224 06/16/2022   NEUTROABS 1.4 (L) 06/16/2022    Imaging:  No results found.  Medications: I have reviewed the patient's current medications.  Assessment/Plan: Sigmoid colon cancer, stage IV (pT4a,pN2b,M1c) Colonoscopy 03/24/2019-3 rectal polyps-hyperplastic polyps, distal colon biopsy-at least intramucosal adenocarcinoma, completely obstructing mass in the distal sigmoid colon, could not be traversed, intact mismatch repair protein expression 03/24/2019-CEA 56.1 03/29/2019 CT abdomen/pelvis-circumferential thickening involving the entire mid and distal sigmoid colon to the level of the rectosigmoid junction, solid/cystic mass in the left ovary, bilateral nephrolithiasis Robotic assisted low anterior resection, mesenteric lymphadenectomy,  bilateral salpingo-oophorectomy 04/08/2019 Pathology ([facility] review of outside pathology) metastatic adenocarcinoma involving the peritoneum overlying the round ligament, serosa of the urinary bladder, serosa of the right ureter a sacral area, pelvic peritoneum, and left ovary.  Omental biopsy with focal mucin pools with no carcinoma cells identified, right hemidiaphragm biopsy involved by metastatic adenocarcinoma, invasive adenocarcinoma the sigmoid colon, moderately differentiated, T4a, perineural and vascular invasion present, 7/22 lymph nodes, multiple tumor deposits, resection margins negative Negative for PD-L1, low probability of MSI-high, HER-2 negative, negative for BRAF, NRAS and KRAS alterations CTs 05/19/2019-no evidence of metastatic disease, findings suspicious for colitis of the transverse and ascending colon, small amount of ascites in the cul-de-sac, bilateral renal calculi Cycle 1 FOLFIRI 05/24/2019, bevacizumab added with cycle 2 Cycle 4 FOLFIRI/bevacizumab 07/05/2019 Cycle 5 FOLFIRI 07/27/2019, bevacizumab held secondary to hypertension Cycle 6 FOLFIRI 08/10/1999, bevacizumab held secondary to hypertension CTs 08/30/2019-no evidence of recurrent disease, new subsolid right lower lobe nodule felt to be inflammatory, emphysema Cycle 7 FOLFIRI 09/26/2019, bevacizumab remains on hold secondary to hypertension Cycle 8 FOLFIRI 10/17/2019, bevacizumab held secondary to hypertension, Udenyca added for neutropenia Cycle 9 FOLFIRI 11/07/2019, bevacizumab held secondary to hypertension, Udenyca Cycle 10 FOLFIRI 11/28/2019, bevacizumab held, Udenyca Cycle 11 FOLFIRI 12/26/2019, bevacizumab held, Udenyca CTs 01/25/2020-no evidence of recurrent disease, resolution of right lower lobe nodule Maintenance Xeloda beginning 02/06/2020 CTs 04/25/2020- no evidence of metastatic disease, multiple bilateral renal calculi without hydronephrosis Maintenance Xeloda continued CT abdomen/pelvis without contrast  08/10/2020-bilateral staghorn renal calculi, no evidence of metastatic disease; addendum 08/23/2020-small but increasing omental nodules. Cycle 1 FOLFIRI/panitumumab 09/19/2020 Cycle 2 FOLFIRI/Panitumumab 10/03/2020 Cycle 3 FOLFIRI/Panitumumab 10/17/2020 Cycle 4 FOLFIRI/panitumumab 10/31/2020 Cycle 5 FOLFIRI/Panitumumab 11/14/2020 CTs 11/26/2020-stable omental metastases compared to 10/22/2020,  mildly decreased from 08/10/2020.  Left lower quadrant tiny paracolic gutter implant slightly decreased from CT 08/10/2020.  No new or progressive metastatic disease in the abdomen or pelvis. Cycle 6 FOLFIRI/Panitumumab 11/28/2020, irinotecan dose reduced, treatment schedule adjusted to every 3 weeks going forward Cycle 7 FOLFIRI/panitumumab 12/24/2020 Cycle 8 FOLFIRI/Panitumumab 01/16/2021--treatment held due to hypertension in the infusion area. Cycle 8 FOLFIRI/panitumumab 01/21/2021 Cycle 9 FOLFIRI/panitumumab 02/11/2021 Cycle 10 FOLFIRI/Panitumumab 03/04/2021 Cycle 11 FOLFIRI/Panitumumab 03/25/2021 CT abdomen/pelvis 04/10/2021-stable right omental and left posterior paracolic gutter nodules Cycle 12 FOLFIRI/panitumumab 04/15/2021 Cycle 13 FOLFIRI/panitumumab 05/06/2021 Cycle 14 FOLFIRI/Panitumumab 05/27/2021 Cycle 15 FOLFIRI/Panitumumab 06/17/2021 CT abdomen/pelvis 07/05/2021-slight enlargement of a dominant right omental implant, no new implants Patient requested a treatment break CT abdomen/pelvis 10/04/2021-mild increase in size of multiple omental soft tissue nodules, 4 mm calculus in the distal left ureter adjacent to the ureteral stent Cycle 1 Lonsurf 10/14/2021 10/28/2021 Avastin every 2 weeks Cycle 2 Lonsurf 11/11/2021 Cycle 3 Lonsurf 12/09/2021 Cycle 4 Lonsurf 01/06/2022 Avastin held 01/20/2022 due to proteinuria, 24-hour urine 43 mg CT/pelvis 01/30/2022-possible new peritoneal implant at the transverse colon, no significant change in other omental/peritoneal implants, stable chronic rectal wall thickening, status  post removal of double-J left ureteral stent with no evidence of hydronephrosis or ureteral calculus, nonobstructing bilateral renal calculi Cycle 5 Lonsurf 02/05/2022 or 02/06/2022 Avastin every 2 weeks Cycle 6 Lonsurf 03/03/2022 CTs 04/05/2022-increase in size and number of peritoneal implants compared to January 2024 central liver mass narrowing the anterior branch of the right portal vein, decreased biliary duct dilation, biliary stents in place, stable right cardiophrenic lymph nodes, separate pigtail stent in the lumen of the proximal duodenum Cycle 1 FOLFOX 04/14/2022 CT abdomen/pelvis 04/16/2022-stent within the third portion of the duodenum, stable peritoneal nodules, indwelling biliary stents with mild left biliary duct dilation Cycle 2 FOLFOX 04/28/2022, Emend and prophylactic dexamethasone added Cycle 3 FOLFOX 05/12/2022, Aloxi, Emend, prophylactic dexamethasone, Compazine and lorazepam as needed Chemotherapy held 05/25/2020 due to severe hypertension, evaluated in the emergency department Cycle 4 FOLFOX 06/03/2022 Cycle 5 FOLFOX 06/16/2022, oxaliplatin dose reduced and 5-FU bolus eliminated due to neutropenia   Hypertension G4 P3, twins Kidney stones-bilateral staghorn renal calculi on CT 08/10/2020 Infected Port-A-Cath 09/12/2019-placed on Augmentin, referred for Port-A-Cath removal; Port-A-Cath removed 09/14/2019; PICC line placed 09/14/2019; culture staph aureus.  Course of Septra completed. Neutropenia secondary to chemotherapy-Udenyca added with cycle 8 FOLFIRI Right nephrostomy tube 09/12/2020; stent placed 10/09/2020, percutaneous nephrostolithotomy treatment of right-sided kidney stones 10/09/2020, malpositioned right ureter stent replacement 10/23/2020, right ureter stent removed 10/29/2020 Percutaneous left nephrostolithotomy 10/01/2021-no renal stone identified, left ureter stent left in place Cystoscopy 10/14/2021-stent removed 8.  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  Admission 04/16/2022 with nausea/vomiting and abdominal pain, felt to be acute toxicity related to chemotherapy, discharged home 04/18/2022  Disposition: Ms. Burdette appears stable.  She has completed 4 cycles of FOLFOX.  She has mild neutropenia on lab today.  We discussed white cell growth factor support but are unable to confirm insurance approval at this time.  We reviewed options to include holding treatment today and rescheduling for 1 week versus proceeding with  treatment today and dose reducing the chemotherapy.  She would like to go ahead with treatment today.  She understands the infection risk associated with neutropenia and agrees to proceed.  Plan to proceed with cycle 5 FOLFOX today as scheduled, oxaliplatin dose reduced and 5-FU bolus eliminated.  CT scans prior to next office visit.  She will return for follow-up in 2 weeks.  She will contact the office in the interim with any problems.  We specifically discussed fever, chills, other signs of infection.    Lonna Cobb ANP/GNP-BC   06/16/2022  10:43 AM

## 2022-06-18 ENCOUNTER — Other Ambulatory Visit (HOSPITAL_BASED_OUTPATIENT_CLINIC_OR_DEPARTMENT_OTHER): Payer: Self-pay

## 2022-06-18 ENCOUNTER — Inpatient Hospital Stay: Payer: 59

## 2022-06-18 ENCOUNTER — Other Ambulatory Visit: Payer: Self-pay | Admitting: Nurse Practitioner

## 2022-06-18 VITALS — BP 120/90 | HR 94 | Temp 97.3°F

## 2022-06-18 DIAGNOSIS — C20 Malignant neoplasm of rectum: Secondary | ICD-10-CM

## 2022-06-18 DIAGNOSIS — Z5111 Encounter for antineoplastic chemotherapy: Secondary | ICD-10-CM | POA: Diagnosis not present

## 2022-06-18 DIAGNOSIS — C786 Secondary malignant neoplasm of retroperitoneum and peritoneum: Secondary | ICD-10-CM | POA: Diagnosis not present

## 2022-06-18 DIAGNOSIS — C187 Malignant neoplasm of sigmoid colon: Secondary | ICD-10-CM | POA: Diagnosis not present

## 2022-06-18 DIAGNOSIS — C19 Malignant neoplasm of rectosigmoid junction: Secondary | ICD-10-CM

## 2022-06-18 DIAGNOSIS — Z79899 Other long term (current) drug therapy: Secondary | ICD-10-CM | POA: Diagnosis not present

## 2022-06-18 DIAGNOSIS — Z5189 Encounter for other specified aftercare: Secondary | ICD-10-CM | POA: Diagnosis not present

## 2022-06-18 MED ORDER — PEGFILGRASTIM-CBQV 6 MG/0.6ML ~~LOC~~ SOSY
6.0000 mg | PREFILLED_SYRINGE | Freq: Once | SUBCUTANEOUS | Status: AC
  Administered 2022-06-18: 6 mg via SUBCUTANEOUS
  Filled 2022-06-18: qty 0.6

## 2022-06-18 MED ORDER — SODIUM CHLORIDE 0.9% FLUSH
10.0000 mL | INTRAVENOUS | Status: DC | PRN
  Administered 2022-06-18: 10 mL

## 2022-06-18 MED ORDER — HYDROCODONE-ACETAMINOPHEN 10-325 MG PO TABS
1.0000 | ORAL_TABLET | Freq: Four times a day (QID) | ORAL | 0 refills | Status: DC | PRN
Start: 2022-06-18 — End: 2022-07-21
  Filled 2022-06-18: qty 45, 12d supply, fill #0

## 2022-06-18 MED ORDER — HEPARIN SOD (PORK) LOCK FLUSH 100 UNIT/ML IV SOLN
500.0000 [IU] | Freq: Once | INTRAVENOUS | Status: AC | PRN
  Administered 2022-06-18: 500 [IU]

## 2022-06-18 NOTE — Patient Instructions (Signed)

## 2022-06-20 ENCOUNTER — Ambulatory Visit: Payer: 59 | Admitting: Gastroenterology

## 2022-06-21 ENCOUNTER — Other Ambulatory Visit: Payer: Self-pay

## 2022-06-23 ENCOUNTER — Ambulatory Visit (INDEPENDENT_AMBULATORY_CARE_PROVIDER_SITE_OTHER)
Admission: RE | Admit: 2022-06-23 | Discharge: 2022-06-23 | Disposition: A | Discharge status: Home / Self Care | Payer: 59 | Source: Ambulatory Visit | Attending: Gastroenterology | Admitting: Gastroenterology

## 2022-06-23 DIAGNOSIS — K831 Obstruction of bile duct: Secondary | ICD-10-CM | POA: Diagnosis not present

## 2022-06-23 DIAGNOSIS — Z4659 Encounter for fitting and adjustment of other gastrointestinal appliance and device: Secondary | ICD-10-CM | POA: Diagnosis not present

## 2022-06-23 DIAGNOSIS — T85528A Displacement of other gastrointestinal prosthetic devices, implants and grafts, initial encounter: Secondary | ICD-10-CM

## 2022-06-24 ENCOUNTER — Encounter: Payer: Self-pay | Admitting: Gastroenterology

## 2022-06-24 ENCOUNTER — Ambulatory Visit (INDEPENDENT_AMBULATORY_CARE_PROVIDER_SITE_OTHER): Payer: 59 | Admitting: Gastroenterology

## 2022-06-24 VITALS — BP 110/76 | HR 119 | Ht 64.0 in | Wt 142.0 lb

## 2022-06-24 DIAGNOSIS — Z85038 Personal history of other malignant neoplasm of large intestine: Secondary | ICD-10-CM | POA: Diagnosis not present

## 2022-06-24 DIAGNOSIS — K5909 Other constipation: Secondary | ICD-10-CM | POA: Diagnosis not present

## 2022-06-24 DIAGNOSIS — R194 Change in bowel habit: Secondary | ICD-10-CM

## 2022-06-24 DIAGNOSIS — Z9889 Other specified postprocedural states: Secondary | ICD-10-CM | POA: Diagnosis not present

## 2022-06-24 MED ORDER — NA SULFATE-K SULFATE-MG SULF 17.5-3.13-1.6 GM/177ML PO SOLN: 1.0000 | ORAL | 0 refills | Status: DC

## 2022-06-24 NOTE — Patient Instructions (Signed)
You have been scheduled for a colonoscopy. Please follow written instructions given to you at your visit today.  Please pick up your prep supplies at the pharmacy within the next 1-3 days. If you use inhalers (even only as needed), please bring them with you on the day of your procedure.  We have sent the following medications to your pharmacy for you to pick up at your convenience: Suprep   _______________________________________________________  If your blood pressure at your visit was 140/90 or greater, please contact your primary care physician to follow up on this.  _______________________________________________________  If you are age 43 or older, your body mass index should be between 23-30. Your Body mass index is 24.37 kg/m. If this is out of the aforementioned range listed, please consider follow up with your Primary Care Provider.  If you are age 12 or younger, your body mass index should be between 19-25. Your Body mass index is 24.37 kg/m. If this is out of the aformentioned range listed, please consider follow up with your Primary Care Provider.   ________________________________________________________  The [place] providers would like to encourage you to use [facility] to communicate with providers for non-urgent requests or questions.  Due to long hold times on the telephone, sending your provider a message by [facility] may be a faster and more efficient way to get a response.  Please allow 48 business hours for a response.  Please remember that this is for non-urgent requests.  _______________________________________________________  Bonita Quin will be due for a recall ERCP  in Oct 2024. We will send you a reminder in the mail when it gets closer to that time.  Thank you for choosing me and [facility].  Dr. Meridee Score

## 2022-06-24 NOTE — Assessment & Plan Note (Signed)
Recent significant elevations in her blood pressure indicates poor control with the change in regimen to assist in reduction of pill burden.  She has gone back to amlodipine 10 mg, metoprolol 50 mg twice a day, and hydralazine.  I recommend that she continue with this regimen and also include chlorthalidone 50 mg daily to see if we can get improved control.  We discussed that if her blood pressures drop too low she can half this dose. Plan: Monitor blood pressures at home with a goal of less than 140/85. Continue amlodipine 10 mg, metoprolol 50 mg twice a day, chlorthalidone 25 to 50 mg daily, and hydralazine as needed if blood pressures are greater than 140/85. Discontinue valsartan. Stress management techniques discussed and encouraged. Report immediately if blood pressure readings are remaining elevated despite treatment changes.

## 2022-06-24 NOTE — Progress Notes (Signed)
GASTROENTEROLOGY OUTPATIENT CLINIC VISIT   Primary Care Provider Early, Sung Amabile, NP [address] [place] [address] [phone]  Patient Profile: April [name] is a 47 y.o. female with a pmh significant for hypertension, metastatic colon cancer (complicated by biliary obstruction status post ERCP with stenting).  The patient presents to the [facility] for an evaluation and management of problem(s) noted below:  Problem List 1. History of colon cancer   2. History of biliary stent insertion   3. Change in bowel habits   4. Other constipation     History of Present Illness This is a patient that I met in the hospital in March for issues of biliary obstruction from metastatic colon cancer.  Eventually we performed an ERCP with placement of bilateral stents as result of hilar disease.  LFTs subsequently improved.  She had abdominal pain that was present prior to ERCP but persisted for weeks.  This is improved that she has had further chemotherapy performed.  She has upcoming imaging with oncology later this week.  She is no longer having abdominal pain which she is happy about.  She does deal with constipation at times.  She has noted significant changes in her bowel habits since her initiation of chemotherapy.  She has not had a colonoscopy in years and wonders if her bowel habit changes are result of persisting disease in her colon, since she never had resection.  She denies any fevers or chills.  She is not noticing any blood in her stools.  The patient denies any issues with jaundice, scleral icterus, generalized pruritus, darkened/amber urine, clay-colored stools, LE edema, hematemesis, coffee-ground emesis, abdominal distention, confusion.  GI Review of Systems Positive as above Negative for dysphagia, odynophagia, nausea, vomiting  Review of Systems General: Denies fevers/chills/weight loss unintentionally Cardiovascular: Denies chest  pain Pulmonary: Denies shortness of breath/cough Gastroenterological: See HPI Genitourinary: Denies darkened urine Hematological: Denies easy bruising/bleeding Endocrine: Denies temperature intolerance Dermatological: Denies jaundice Psychological: Mood is stable   Medications Current Outpatient Medications  Medication Sig Dispense Refill   alum & mag hydroxide-simeth (MAALOX/MYLANTA) 200-200-20 MG/5ML suspension Take 30 mLs by mouth every 6 (six) hours as needed for indigestion or heartburn. 355 mL 1   amLODipine (NORVASC) 10 MG tablet Take 10 mg by mouth daily.     chlorthalidone (HYGROTON) 25 MG tablet Take 1 tablet (25 mg total) by mouth daily. 90 tablet 3   dexamethasone (DECADRON) 4 MG tablet Take 1 tablet twice a day for 3 days beginning day 2 12 tablet 2   HYDROcodone-acetaminophen (NORCO) 10-325 MG tablet Take 1 tablet by mouth every 6 (six) hours as needed. 45 tablet 0   LORazepam (ATIVAN) 0.5 MG tablet Take 1 tablet (0.5 mg total) by mouth every 8 (eight) hours as needed (for nausea). Do not drive while taking, do not take with hydrocodone 30 tablet 0   Multiple Vitamin (MULTIVITAMIN WITH MINERALS) TABS tablet Take 1 tablet by mouth daily.     Na Sulfate-K Sulfate-Mg Sulf (SUPREP BOWEL PREP KIT) 17.5-3.13-1.6 GM/177ML SOLN Take 1 kit by mouth as directed. For colonoscopy prep 354 mL 0   ondansetron (ZOFRAN) 4 MG tablet Take 1 tablet (4 mg total) by mouth every 8 (eight) hours as needed for nausea or vomiting. 30 tablet 0   pantoprazole (PROTONIX) 40 MG tablet Starting on 03/27/2022, take 40 mg by mouth two times a day before meals for 60 days. On Day #61, decrease to 40 mg once a day before a  meal.     amLODipine-valsartan (EXFORGE) 10-320 MG tablet Take 1 tablet by mouth daily. (Patient not taking: Reported on 06/24/2022) 30 tablet 2   lactulose (CHRONULAC) 10 GM/15ML solution Take 15 mLs (10 g total) by mouth 2 (two) times daily. Take twice daily until good bowel movement, then  decrease to 15 ml once daily (Patient not taking: Reported on 06/24/2022) 236 mL 1   Plecanatide (TRULANCE) 3 MG TABS Take 3 mg by mouth daily as needed (for constipation). (Patient not taking: Reported on 06/24/2022)     Polyethylene Glycol 3350 (MIRALAX PO) Take 17 g by mouth See admin instructions. Mix 17 grams of powder into the suggested amount of water or liquid and drink one to two times a day as needed for constipation (Patient not taking: Reported on 06/24/2022)     prochlorperazine (COMPAZINE) 10 MG tablet Take 1 tablet (10 mg total) by mouth every 6 (six) hours as needed for nausea or vomiting. (Patient not taking: Reported on 06/24/2022) 40 tablet 2   senna-docusate (SENOKOT-S) 8.6-50 MG tablet Take 1 tablet by mouth 2 (two) times daily. (Patient not taking: Reported on 06/24/2022) 30 tablet 0   No current facility-administered medications for this visit.    Allergies Allergies  Allergen Reactions   Lisinopril Swelling and Other (See Comments)    Lip swelling and Angioedema   Irinotecan Other (See Comments)    Stomach cramping Patient given 0.5 mL atropine IV and was able to continue infusion.  See Progress note on 10/03/20.    Leucovorin Calcium Other (See Comments)    Stomach cramping Patient given 0.5 mL atropine IV and was able to continue infusion. See Progress note on 10/03/20.     Histories Past Medical History:  Diagnosis Date   Benign essential hypertension 10/06/2017   Last Assessment & Plan:  Formatting of this note might be different from the original. Patient's blood pressure noted to be elevated at today's visit 188/98.  Patient will be provided with antihypertensives in the treatment center if needed in order to meet her Avastin parameters.  Patient recommended to follow up with her primary care physician regarding medication management.  Patient is not cur   [facility])    Colon cancer (HCC)    Hypertension    Renal calculi    bilateral   Past Surgical History:   Procedure Laterality Date   BILIARY BRUSHING  03/24/2022   Procedure: BILIARY BRUSHING;  Surgeon: Meridee Score Netty Starring., MD;  Location: [facility] ENDOSCOPY;  Service: Gastroenterology;;   BILIARY DILATION  03/24/2022   Procedure: BILIARY DILATION;  Surgeon: Lemar Lofty., MD;  Location: [facility] ENDOSCOPY;  Service: Gastroenterology;;   BILIARY STENT PLACEMENT  03/24/2022   Procedure: BILIARY STENT PLACEMENT;  Surgeon: Lemar Lofty., MD;  Location: [facility] ENDOSCOPY;  Service: Gastroenterology;;   BIOPSY  03/24/2022   Procedure: BIOPSY;  Surgeon: Lemar Lofty., MD;  Location: [facility] ENDOSCOPY;  Service: Gastroenterology;;   CESAREAN SECTION     x2   CYSTOSCOPY WITH RETROGRADE PYELOGRAM, URETEROSCOPY AND STENT PLACEMENT Right 10/22/2020   Procedure: CYSTOSCOPY WITH RETROGRADE PYELOGRAM, URETEROSCOPY AND STENT PLACEMENT;  Surgeon: Noel Christmas, MD;  Location: [facility] ORS;  Service: Urology;  Laterality: Right;   ERCP N/A 03/24/2022   Procedure: ENDOSCOPIC RETROGRADE CHOLANGIOPANCREATOGRAPHY (ERCP);  Surgeon: Lemar Lofty., MD;  Location: [facility] ENDOSCOPY;  Service: Gastroenterology;  Laterality: N/A;   IR RADIOLOGIST EVAL & MGMT  09/16/2019   IR RADIOLOGIST EVAL & MGMT  09/23/2019  IR RADIOLOGIST EVAL & MGMT  09/20/2019   IR RADIOLOGIST EVAL & MGMT  09/30/2019   IR REMOVAL TUN ACCESS W/ PORT W/O FL MOD SED  09/14/2019   IR VENO/EXT/UNI RIGHT  04/11/2022   PANCREATIC STENT PLACEMENT  03/24/2022   Procedure: PANCREATIC STENT PLACEMENT;  Surgeon: Lemar Lofty., MD;  Location: [facility] ENDOSCOPY;  Service: Gastroenterology;;   REMOVAL OF STONES  03/24/2022   Procedure: REMOVAL OF STONES;  Surgeon: Lemar Lofty., MD;  Location: [facility] ENDOSCOPY;  Service: Gastroenterology;;   Dennison Mascot  03/24/2022   Procedure: Dennison Mascot;  Surgeon: Mansouraty, Netty Starring., MD;  Location: [facility] ENDOSCOPY;  Service: Gastroenterology;;   URETERAL STENT PLACEMENT     Social History    Socioeconomic History   Marital status: Single    Spouse name: Not on file   Number of children: Not on file   Years of education: Not on file   Highest education level: Not on file  Occupational History   Not on file  Tobacco Use   Smoking status: Former    Types: Cigarettes   Smokeless tobacco: Never   Tobacco comments:    Quit 1 month ago   Vaping Use   Vaping Use: Never used  Substance and Sexual Activity   Alcohol use: Not Currently   Drug use: Not Currently   Sexual activity: Not Currently  Other Topics Concern   Not on file  Social History Narrative   Not on file   Social Determinants of Health   Financial Resource Strain: High Risk (04/16/2021)   Overall Financial Resource Strain (CARDIA)    Difficulty of Paying Living Expenses: Hard  Food Insecurity: Food Insecurity Present (04/16/2022)   Hunger Vital Sign    Worried About Running Out of Food in the Last Year: Sometimes true    Ran Out of Food in the Last Year: Sometimes true  Transportation Needs: Unmet Transportation Needs (04/21/2022)   PRAPARE - Administrator, Civil Service (Medical): Yes    Lack of Transportation (Non-Medical): No  Physical Activity: Inactive (04/16/2021)   Exercise Vital Sign    Days of Exercise per Week: 0 days    Minutes of Exercise per Session: 0 min  Stress: Stress Concern Present (04/16/2021)   [employer] of Occupational Health - Occupational Stress Questionnaire    Feeling of Stress : Rather much  Social Connections: Socially Isolated (04/16/2021)   Social Connection and Isolation Panel [NHANES]    Frequency of Communication with Friends and Family: Once a week    Frequency of Social Gatherings with Friends and Family: Once a week    Attends Religious Services: Never    Database administrator or Organizations: No    Attends Banker Meetings: Never    Marital Status: Divorced  Catering manager Violence: Not At Risk (04/16/2022)   Humiliation, Afraid,  Rape, and Kick questionnaire    Fear of Current or Ex-Partner: No    Emotionally Abused: No    Physically Abused: No    Sexually Abused: No   Family History  Problem Relation Age of Onset   Hypertension Mother    Diabetes Mellitus II Father    Stroke Father    Ulcers Sister    Colon cancer Neg Hx    Esophageal cancer Neg Hx    Inflammatory bowel disease Neg Hx    Liver disease Neg Hx    Pancreatic cancer Neg Hx    Rectal cancer Neg Hx  Stomach cancer Neg Hx    I have reviewed her medical, social, and family history in detail and updated the electronic medical record as necessary.    PHYSICAL EXAMINATION  BP 110/76   Pulse (!) 119   Ht 5\' 4"  (1.626 m)   Wt 142 lb (64.4 kg)   LMP 01/07/2006   BMI 24.37 kg/m  Wt Readings from Last 3 Encounters:  06/24/22 142 lb (64.4 kg)  06/16/22 149 lb 6.4 oz (67.8 kg)  06/12/22 148 lb 1.6 oz (67.2 kg)  GEN: NAD, appears stated age, doesn't appear chronically ill PSYCH: Cooperative, without pressured speech EYE: Conjunctivae pink, sclerae anicteric ENT: MMM CV: Nontachycardic RESP: No audible wheezing GI: NABS, soft, NT/ND, without rebound or guarding MSK/EXT: No significant lower extremity edema SKIN: No jaundice NEURO:  Alert & Oriented x 3, no focal deficits   REVIEW OF DATA  I reviewed the following data at the time of this encounter:  GI Procedures and Studies  3/11 ERCP - LA Grade C esophagitis with no bleeding. - 2 cm hiatal hernia. - Erythematous mucosa in the stomach. Biopsied for HP evaluation. - No gross lesions in the duodenal bulb, in the first portion of the duodenum and in the second portion of the duodenum. - The major papilla appeared normal. - A biliary sphincterotomy was performed. 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 This stricturing was malignant appearing. The stricturing was dilated to 4 mm to allow for tool passage. -  The left and right hepatic ducts and all intrahepatic branches were moderately dilated, secondary to stricturing. - Cells for cytology obtained from the LHD/CHD and RHD/CHD stricture. - One temporary plastic pancreatic stent was placed into the ventral pancreatic duct to decrease PEP  Laboratory Studies  Reviewed those in [facility]  Imaging Studies  April 2024 CT abdomen pelvis IMPRESSION: 1. No significant change from CT 04/04/2022. 2. Indwelling biliary catheters with mild biliary duct dilatation in the LEFT hepatic lobe. 3. Stable peritoneal nodular metastasis. 4. Migrated catheter in the third portion duodenum, no change from prior.   ASSESSMENT  Ms. Thomassie is a 47 y.o. female with a pmh significant for hypertension, metastatic colon cancer (complicated by biliary obstruction status post ERCP with stenting).  The patient is seen today for evaluation and management of:  1. History of colon cancer   2. History of biliary stent insertion   3. Change in bowel habits   4. Other constipation    Patient is hemodynamically stable.  Liver tests look like they have been doing well with bilateral stenting.  Some of this may also be improvement as a result of her being on chemotherapy as well.  She will need biliary stent exchange later this year.  Is not clear if I would place metal stents, because if she may be having some effect from her chemotherapy.  May consider just replacing plastic stents before placing metal stents, time will tell.  The risks of an ERCP were discussed at length, including but not limited to the risk of perforation, bleeding, abdominal pain, post-ERCP pancreatitis (while usually mild can be severe and even life threatening).  Interestingly, the patient's longer standing changes in bowel habits of constipation and concern me that she remains with active disease in her colon.  This is not impossible since she never underwent resection.  Will plan for colonoscopy in the coming  weeks.  She has updated imaging in  the coming week, if something else develops or changes this plan then we can cancel colonoscopy.  The risks and benefits of endoscopic evaluation were discussed with the patient; these include but are not limited to the risk of perforation, infection, bleeding, missed lesions, lack of diagnosis, severe illness requiring hospitalization, as well as anesthesia and sedation related illnesses.  The patient and/or family is agreeable to proceed.  All patient questions were answered to the best of my ability, and the patient agrees to the aforementioned plan of action with follow-up as indicated.   PLAN  Continue current Trulance dosing Low residue diet recommended Proceed with scheduling colonoscopy in the coming weeks ERCP for stent exchange in September/October of this year (unclear if we will do SEMS versus replacing plastic stents bilaterally) follow-up Follow-up CT scan that is already scheduled for later this week   Orders Placed This Encounter  Procedures   Ambulatory referral to Gastroenterology    New Prescriptions   NA SULFATE-K SULFATE-MG SULF (SUPREP BOWEL PREP KIT) 17.5-3.13-1.6 GM/177ML SOLN    Take 1 kit by mouth as directed. For colonoscopy prep   Modified Medications   No medications on file    Planned Follow Up No follow-ups on file.   Total Time in Face-to-Face and in Coordination of Care for patient including independent/personal interpretation/review of prior testing, medical history, examination, medication adjustment, communicating results with the patient directly, and documentation within the EHR is 30 minutes.   Corliss Parish, MD [facility] Office # [phone]

## 2022-06-25 ENCOUNTER — Inpatient Hospital Stay: Payer: 59

## 2022-06-25 ENCOUNTER — Ambulatory Visit (HOSPITAL_BASED_OUTPATIENT_CLINIC_OR_DEPARTMENT_OTHER)
Admission: RE | Admit: 2022-06-25 | Discharge: 2022-06-25 | Disposition: A | Discharge status: Home / Self Care | Payer: 59 | Source: Ambulatory Visit | Attending: Nurse Practitioner | Admitting: Nurse Practitioner

## 2022-06-25 VITALS — BP 118/68 | HR 110 | Temp 98.1°F | Resp 18

## 2022-06-25 DIAGNOSIS — C19 Malignant neoplasm of rectosigmoid junction: Secondary | ICD-10-CM | POA: Diagnosis not present

## 2022-06-25 DIAGNOSIS — Z95828 Presence of other vascular implants and grafts: Secondary | ICD-10-CM

## 2022-06-25 DIAGNOSIS — C189 Malignant neoplasm of colon, unspecified: Secondary | ICD-10-CM | POA: Diagnosis not present

## 2022-06-25 DIAGNOSIS — C20 Malignant neoplasm of rectum: Secondary | ICD-10-CM

## 2022-06-25 MED ORDER — SODIUM CHLORIDE 0.9% FLUSH
10.0000 mL | Freq: Once | INTRAVENOUS | Status: AC
  Administered 2022-06-25: 10 mL

## 2022-06-25 MED ORDER — HEPARIN SOD (PORK) LOCK FLUSH 100 UNIT/ML IV SOLN
500.0000 [IU] | Freq: Once | INTRAVENOUS | Status: AC
  Administered 2022-06-25: 250 [IU]

## 2022-06-25 MED ORDER — HEPARIN SOD (PORK) LOCK FLUSH 100 UNIT/ML IV SOLN
500.0000 [IU] | Freq: Once | INTRAVENOUS | Status: AC
  Administered 2022-06-25: 250 [IU] via INTRAVENOUS

## 2022-06-25 MED ORDER — IOHEXOL 300 MG/ML  SOLN
100.0000 mL | Freq: Once | INTRAMUSCULAR | Status: AC | PRN
  Administered 2022-06-25: 80 mL via INTRAVENOUS

## 2022-06-25 NOTE — Patient Instructions (Signed)
PICC Home Care Guide A peripherally inserted central catheter (PICC) is a form of IV access that allows medicines and IV fluids to be quickly put into the blood and spread throughout the body. The PICC is a long, thin, flexible tube (catheter) that is put into a vein in a person's arm or leg. The catheter ends in a large vein just outside the heart called the superior vena cava (SVC). After the PICC is put in, a chest X-ray may be done to make sure that it is in the right place. A PICC may be placed for different reasons, such as: To give medicines and liquid nutrition. To give IV fluids and blood products. To take blood samples often. If there is trouble placing a peripheral intravenous (PIV) catheter. If cared for properly, a PICC can remain in place for many months. Having a PICC can allow you to go home from the hospital sooner and continue treatment at home. Medicines and PICC care can be managed at home by a family member, caregiver, or home health care team. What are the risks? Generally, having a PICC is safe. However, problems may occur, including: A blood clot (thrombus) forming in or at the end of the PICC. A blood clot forming in a vein (deep vein thrombosis) or traveling to the lung (pulmonary embolism). Inflammation of the vein (phlebitis) in which the PICC is placed. Infection at the insertion site or in the blood. Blood infections from central lines, like PICCs, can be serious and often require a hospital stay. PICC malposition, or PICC movement or poor placement. A break or cut in the PICC. Do not use scissors near the PICC. Nerve or tendon irritation or injury during PICC insertion. How to care for your PICC Please follow the specific guidelines provided by your health care provider. Preventing infection You and any caregivers should wash your hands often with soap and water for at least 20 seconds. Wash hands: Before touching the PICC or the infusion device. Before changing a  bandage (dressing). Do not change the dressing unless you have been taught to do so and have shown you are able to change it safely. Flush the PICC as told. Tell your health care provider right away if the PICC is hard to flush or does not flush. Do not use force to flush the PICC. Use clean and germ-free (sterile) supplies only. Keep the supplies in a dry place. Do not reuse needles, syringes, or any other supplies. Reusing supplies can lead to infection. Keep the PICC dressing dry and secure it with tape if the edges stop sticking to your skin. Check your PICC insertion site every day for signs of infection. Check for: Redness, swelling, or pain. Fluid or blood. Warmth. Pus or a bad smell. Preventing other problems Do not use a syringe that is less than 10 mL to flush the PICC. Do not have your blood pressure checked on the arm in which the PICC is placed. Do not ever pull or tug on the PICC. Keep it secured to your arm with tape or a stretch wrap when not in use. Do not take the PICC out yourself. Only a trained health care provider should remove the PICC. Keep pets and children away from your PICC. How to care for your PICC dressing Keep your PICC dressing clean and dry to prevent infection. Do not take baths, swim, or use a hot tub until your health care provider approves. Ask your health care provider if you can take   showers. You may only be allowed to take sponge baths. When you are allowed to shower: Ask your health care provider to teach you how to wrap the PICC. Cover the PICC with clear plastic wrap and tape to keep it dry while showering. Follow instructions from your health care provider about how to take care of your insertion site and dressing. Make sure you: Wash your hands with soap and water for at least 20 seconds before and after you change your dressing. If soap and water are not available, use hand sanitizer. Change your dressing only if taught to do so by your health care  provider. Your PICC dressing needs to be changed if it becomes loose or wet. Leave stitches (sutures), skin glue, or adhesive strips in place. These skin closures may need to stay in place for 2 weeks or longer. If adhesive strip edges start to loosen and curl up, you may trim the loose edges. Do not remove adhesive strips completely unless your health care provider tells you to do that. Follow these instructions at home: Disposal of supplies Throw away any syringes in a disposal container that is meant for sharp items (sharps container). You can buy a sharps container from a pharmacy, or you can make one by using an empty, hard plastic bottle with a lid. Place any used dressings or infusion bags into a plastic bag. Throw that bag in the trash. General instructions  Always carry your PICC identification card or wear a medical alert bracelet. Keep the tube clamped at all times, unless it is being used. Always carry a smooth-edge clamp with you to clamp the PICC if it breaks. Do not use scissors or sharp objects near the tube. You may bend your arm and move it freely. If your PICC is near or at the bend of your elbow, avoid activity with repeated motion at the elbow. Avoid lifting heavy objects as told by your health care provider. Keep all follow-up visits. This is important. You will need to have your PICC dressing changed at least once a week. Contact a health care provider if: You have pain in your arm, ear, face, or teeth. You have a fever or chills. You have redness, swelling, or pain around the insertion site. You have fluid or blood coming from the insertion site. Your insertion site feels warm to the touch. You have pus or a bad smell coming from the insertion site. Your skin feels hard and raised around the insertion site. Your PICC dressing has gotten wet or is coming off and you have not been taught how to change it. Get help right away if: You have problems with your PICC, such as  your PICC: Was tugged or pulled and has partially come out. Do not  push the PICC back in. Cannot be flushed, is hard to flush, or leaks around the insertion site when it is flushed. Makes a flushing sound when it is flushed. Appears to have a hole or tear. Is accidentally pulled all the way out. If this happens, cover the insertion site with a gauze dressing. Do not throw the PICC away. Your health care provider will need to check it to be sure the entire catheter came out. You feel your heart racing or skipping beats, or you have chest pain. You have shortness of breath or trouble breathing. You have swelling, redness, warmth, or pain in the arm in which the PICC is placed. You have a red streak going up your arm that   starts under the PICC dressing. These symptoms may be an emergency. Get help right away. Call 911. Do not wait to see if the symptoms will go away. Do not drive yourself to the hospital. Summary A peripherally inserted central catheter (PICC) is a long, thin, flexible tube (catheter) that is put into a vein in the arm or leg. If cared for properly, a PICC can remain in place for many months. Having a PICC can allow you to go home from the hospital sooner and continue treatment at home. The PICC is inserted using a germ-free (sterile) technique by a specially trained health care provider. Only a trained health care provider should remove it. Do not have your blood pressure checked on the arm in which your PICC is placed. Always keep your PICC identification card with you. This information is not intended to replace advice given to you by your health care provider. Make sure you discuss any questions you have with your health care provider. Document Revised: 07/18/2020 Document Reviewed: 07/18/2020 Elsevier Patient Education  2024 [employer]  

## 2022-06-26 ENCOUNTER — Telehealth: Payer: Self-pay

## 2022-06-26 NOTE — Telephone Encounter (Signed)
Pt. Called stating her BP has been much lower than usual 110/70 and 118/76. I told her those were good readings. She stated she has been having head aches 1-2 hours after taking both of her BP meds. She takes amlodipine and chlorthalidone recently changed by [facility]. She said it has also made her get SOB, with some heart fluttering and cough.

## 2022-06-28 ENCOUNTER — Encounter: Payer: Self-pay | Admitting: Gastroenterology

## 2022-06-28 DIAGNOSIS — Z9889 Other specified postprocedural states: Secondary | ICD-10-CM | POA: Insufficient documentation

## 2022-06-28 DIAGNOSIS — R194 Change in bowel habit: Secondary | ICD-10-CM | POA: Insufficient documentation

## 2022-06-28 DIAGNOSIS — Z85038 Personal history of other malignant neoplasm of large intestine: Secondary | ICD-10-CM

## 2022-06-28 HISTORY — DX: Personal history of other malignant neoplasm of large intestine: Z85.038

## 2022-06-28 HISTORY — DX: Change in bowel habit: R19.4

## 2022-06-29 ENCOUNTER — Other Ambulatory Visit: Payer: Self-pay | Admitting: Oncology

## 2022-06-29 DIAGNOSIS — C19 Malignant neoplasm of rectosigmoid junction: Secondary | ICD-10-CM

## 2022-06-30 ENCOUNTER — Inpatient Hospital Stay (HOSPITAL_BASED_OUTPATIENT_CLINIC_OR_DEPARTMENT_OTHER): Payer: 59 | Admitting: Oncology

## 2022-06-30 ENCOUNTER — Inpatient Hospital Stay: Payer: 59

## 2022-06-30 ENCOUNTER — Encounter: Payer: Self-pay | Admitting: *Deleted

## 2022-06-30 ENCOUNTER — Encounter: Payer: Self-pay | Admitting: Oncology

## 2022-06-30 ENCOUNTER — Other Ambulatory Visit: Payer: Self-pay | Admitting: *Deleted

## 2022-06-30 VITALS — BP 122/65

## 2022-06-30 VITALS — BP 134/70 | HR 95 | Temp 98.2°F | Resp 18 | Ht 64.0 in | Wt 146.2 lb

## 2022-06-30 DIAGNOSIS — C19 Malignant neoplasm of rectosigmoid junction: Secondary | ICD-10-CM

## 2022-06-30 DIAGNOSIS — C187 Malignant neoplasm of sigmoid colon: Secondary | ICD-10-CM | POA: Diagnosis not present

## 2022-06-30 DIAGNOSIS — C786 Secondary malignant neoplasm of retroperitoneum and peritoneum: Secondary | ICD-10-CM | POA: Diagnosis not present

## 2022-06-30 DIAGNOSIS — Z5111 Encounter for antineoplastic chemotherapy: Secondary | ICD-10-CM | POA: Diagnosis not present

## 2022-06-30 DIAGNOSIS — Z79899 Other long term (current) drug therapy: Secondary | ICD-10-CM | POA: Diagnosis not present

## 2022-06-30 DIAGNOSIS — Z5189 Encounter for other specified aftercare: Secondary | ICD-10-CM | POA: Diagnosis not present

## 2022-06-30 DIAGNOSIS — C20 Malignant neoplasm of rectum: Secondary | ICD-10-CM | POA: Diagnosis not present

## 2022-06-30 LAB — CMP (CANCER CENTER ONLY)
ALT: 7 U/L (ref 0–44)
AST: 12 U/L — ABNORMAL LOW (ref 15–41)
Albumin: 3.9 g/dL (ref 3.5–5.0)
Alkaline Phosphatase: 136 U/L — ABNORMAL HIGH (ref 38–126)
Anion gap: 8 (ref 5–15)
BUN: 7 mg/dL (ref 6–20)
CO2: 33 mmol/L — ABNORMAL HIGH (ref 22–32)
Calcium: 9.5 mg/dL (ref 8.9–10.3)
Chloride: 97 mmol/L — ABNORMAL LOW (ref 98–111)
Creatinine: 0.51 mg/dL (ref 0.44–1.00)
GFR, Estimated: >60 mL/min (ref >60)
Glucose, Bld: 92 mg/dL (ref 70–99)
Potassium: 2.8 mmol/L — ABNORMAL LOW (ref 3.5–5.1)
Sodium: 138 mmol/L (ref 135–145)
Total Bilirubin: 0.2 mg/dL — ABNORMAL LOW (ref 0.3–1.2)
Total Protein: 7.8 g/dL (ref 6.5–8.1)

## 2022-06-30 LAB — CBC WITH DIFFERENTIAL (CANCER CENTER ONLY)
Abs Immature Granulocytes: 0.18 10*3/uL — ABNORMAL HIGH (ref 0.00–0.07)
Basophils Absolute: 0.1 10*3/uL (ref 0.0–0.1)
Basophils Relative: 1 %
Eosinophils Absolute: 0.1 10*3/uL (ref 0.0–0.5)
Eosinophils Relative: 1 %
HCT: 34.2 % — ABNORMAL LOW (ref 36.0–46.0)
Hemoglobin: 11.3 g/dL — ABNORMAL LOW (ref 12.0–15.0)
Immature Granulocytes: 1 %
Lymphocytes Relative: 19 %
Lymphs Abs: 2.8 10*3/uL (ref 0.7–4.0)
MCH: 30.1 pg (ref 26.0–34.0)
MCHC: 33 g/dL (ref 30.0–36.0)
MCV: 91.2 fL (ref 80.0–100.0)
Monocytes Absolute: 1.2 10*3/uL — ABNORMAL HIGH (ref 0.1–1.0)
Monocytes Relative: 8 %
Neutro Abs: 10.6 10*3/uL — ABNORMAL HIGH (ref 1.7–7.7)
Neutrophils Relative %: 70 %
Platelet Count: 172 10*3/uL (ref 150–400)
RBC: 3.75 MIL/uL — ABNORMAL LOW (ref 3.87–5.11)
RDW: 15.5 % (ref 11.5–15.5)
WBC Count: 15 10*3/uL — ABNORMAL HIGH (ref 4.0–10.5)
nRBC: 0 % (ref 0.0–0.2)

## 2022-06-30 LAB — MAGNESIUM: Magnesium: 2 mg/dL (ref 1.7–2.4)

## 2022-06-30 LAB — CEA (ACCESS): CEA (CHCC): 2.99 ng/mL (ref 0.00–5.00)

## 2022-06-30 MED ORDER — DEXTROSE 5 % IV SOLN: Freq: Once | INTRAVENOUS | Status: AC

## 2022-06-30 MED ORDER — POTASSIUM CHLORIDE ER 10 MEQ PO CPCR: 10.0000 meq | ORAL_CAPSULE | Freq: Two times a day (BID) | ORAL | 2 refills | Status: DC

## 2022-06-30 MED ORDER — SODIUM CHLORIDE 0.9 % IV SOLN
10.0000 mg | Freq: Once | INTRAVENOUS | Status: AC
  Administered 2022-06-30: 10 mg via INTRAVENOUS
  Filled 2022-06-30: qty 10

## 2022-06-30 MED ORDER — SODIUM CHLORIDE 0.9 % IV SOLN
150.0000 mg | Freq: Once | INTRAVENOUS | Status: AC
  Administered 2022-06-30: 150 mg via INTRAVENOUS
  Filled 2022-06-30: qty 150

## 2022-06-30 MED ORDER — LEUCOVORIN CALCIUM INJECTION 350 MG
400.0000 mg/m2 | Freq: Once | INTRAVENOUS | Status: AC
  Administered 2022-06-30: 704 mg via INTRAVENOUS
  Filled 2022-06-30: qty 35.2

## 2022-06-30 MED ORDER — SODIUM CHLORIDE 0.9 % IV SOLN
3100.0000 mg | INTRAVENOUS | Status: DC
  Administered 2022-06-30: 3100 mg via INTRAVENOUS
  Filled 2022-06-30: qty 62

## 2022-06-30 MED ORDER — OXALIPLATIN CHEMO INJECTION 100 MG/20ML
65.0000 mg/m2 | Freq: Once | INTRAVENOUS | Status: AC
  Administered 2022-06-30: 115 mg via INTRAVENOUS
  Filled 2022-06-30: qty 20

## 2022-06-30 MED ORDER — PALONOSETRON HCL INJECTION 0.25 MG/5ML
0.2500 mg | Freq: Once | INTRAVENOUS | Status: AC
  Administered 2022-06-30: 0.25 mg via INTRAVENOUS
  Filled 2022-06-30: qty 5

## 2022-06-30 MED ORDER — FLUOROURACIL CHEMO INJECTION 2.5 GM/50ML
400.0000 mg/m2 | Freq: Once | INTRAVENOUS | Status: AC
  Administered 2022-06-30: 700 mg via INTRAVENOUS
  Filled 2022-06-30: qty 14

## 2022-06-30 NOTE — Patient Instructions (Addendum)
[facility]   The chemotherapy medication bag should finish at 46 hours, 96 hours, or 7 days. For example, if your pump is scheduled for 46 hours and it was put on at 4:00 p.m., it should finish at 2:00 p.m. the day it is scheduled to come off regardless of your appointment time.     Estimated time to finish at 11:45 Wednesday, July 02, 2022.   If the display on your pump reads "Low Volume" and it is beeping, take the batteries out of the pump and come to the cancer center for it to be taken off.   If the pump alarms go off prior to the pump reading "Low Volume" then call [phone] and someone can assist you.  If the plunger comes out and the chemotherapy medication is leaking out, please use your home chemo spill kit to clean up the spill. Do NOT use paper towels or other household products.  If you have problems or questions regarding your pump, please call either [phone] (24 hours a day) or the cancer center Monday-Friday 8:00 a.m.- 4:30 p.m. at the clinic number and we will assist you. If you are unable to get assistance, then go to the nearest Emergency Department and ask the staff to contact the IV team for assistance.  Discharge Instructions: Thank you for choosing [facility] to provide your oncology and hematology care.   If you have a lab appointment with the Cancer Center, please go directly to the Cancer Center and check in at the registration area.   Wear comfortable clothing and clothing appropriate for easy access to any Portacath or PICC line.   We strive to give you quality time with your provider. You may need to reschedule your appointment if you arrive late (15 or more minutes).  Arriving late affects you and other patients whose appointments are after yours.  Also, if you miss three or more appointments without notifying the office, you may be dismissed from the clinic at the provider's discretion.      For  prescription refill requests, have your pharmacy contact our office and allow 72 hours for refills to be completed.    Today you received the following chemotherapy and/or immunotherapy agents Oxaliplatin, Leucovorin, Fluorouracil.      To help prevent nausea and vomiting after your treatment, we encourage you to take your nausea medication as directed.  BELOW ARE SYMPTOMS THAT SHOULD BE REPORTED IMMEDIATELY: *FEVER GREATER THAN 100.4 F (38 C) OR HIGHER *CHILLS OR SWEATING *NAUSEA AND VOMITING THAT IS NOT CONTROLLED WITH YOUR NAUSEA MEDICATION *UNUSUAL SHORTNESS OF BREATH *UNUSUAL BRUISING OR BLEEDING *URINARY PROBLEMS (pain or burning when urinating, or frequent urination) *BOWEL PROBLEMS (unusual diarrhea, constipation, pain near the anus) TENDERNESS IN MOUTH AND THROAT WITH OR WITHOUT PRESENCE OF ULCERS (sore throat, sores in mouth, or a toothache) UNUSUAL RASH, SWELLING OR PAIN  UNUSUAL VAGINAL DISCHARGE OR ITCHING   Items with * indicate a potential emergency and should be followed up as soon as possible or go to the Emergency Department if any problems should occur.  Please show the CHEMOTHERAPY ALERT CARD or IMMUNOTHERAPY ALERT CARD at check-in to the Emergency Department and triage nurse.  Should you have questions after your visit or need to cancel or reschedule your appointment, please contact [facility]  Dept: [phone]  and follow the prompts.  Office hours are 8:00 a.m. to 4:30 p.m. Monday - Friday. Please note  that voicemails left after 4:00 p.m. may not be returned until the following business day.  We are closed weekends and major holidays. You have access to a nurse at all times for urgent questions. Please call the main number to the clinic Dept: [phone] and follow the prompts.   For any non-urgent questions, you may also contact your provider using MyChart. We now offer e-Visits for anyone 1 and older to request care online  for non-urgent symptoms. For details visit [email or web address].   Also download the MyChart app! Go to the app store, search "MyChart", open the app, select [place], and log in with your MyChart username and password.  Oxaliplatin Injection What is this medication? OXALIPLATIN (ox AL i PLA tin) treats colorectal cancer. It works by slowing down the growth of cancer cells. This medicine may be used for other purposes; ask your health care provider or pharmacist if you have questions. COMMON BRAND NAME(S): Eloxatin What should I tell my care team before I take this medication? They need to know if you have any of these conditions: Heart disease History of irregular heartbeat or rhythm Liver disease Low blood cell levels (white cells, red cells, and platelets) Lung or breathing disease, such as asthma Take medications that treat or prevent blood clots Tingling of the fingers, toes, or other nerve disorder An unusual or allergic reaction to oxaliplatin, other medications, foods, dyes, or preservatives If you or your partner are pregnant or trying to get pregnant Breast-feeding How should I use this medication? This medication is injected into a vein. It is given by your care team in a hospital or clinic setting. Talk to your care team about the use of this medication in children. Special care may be needed. Overdosage: If you think you have taken too much of this medicine contact a poison control center or emergency room at once. NOTE: This medicine is only for you. Do not share this medicine with others. What if I miss a dose? Keep appointments for follow-up doses. It is important not to miss a dose. Call your care team if you are unable to keep an appointment. What may interact with this medication? Do not take this medication with any of the following: Cisapride Dronedarone Pimozide Thioridazine This medication may also interact with the following: Aspirin and aspirin-like  medications Certain medications that treat or prevent blood clots, such as warfarin, apixaban, dabigatran, and rivaroxaban Cisplatin Cyclosporine Diuretics Medications for infection, such as acyclovir, adefovir, amphotericin B, bacitracin, cidofovir, foscarnet, ganciclovir, gentamicin, pentamidine, vancomycin NSAIDs, medications for pain and inflammation, such as ibuprofen or naproxen Other medications that cause heart rhythm changes Pamidronate Zoledronic acid This list may not describe all possible interactions. Give your health care provider a list of all the medicines, herbs, non-prescription drugs, or dietary supplements you use. Also tell them if you smoke, drink alcohol, or use illegal drugs. Some items may interact with your medicine. What should I watch for while using this medication? Your condition will be monitored carefully while you are receiving this medication. You may need blood work while taking this medication. This medication may make you feel generally unwell. This is not uncommon as chemotherapy can affect healthy cells as well as cancer cells. Report any side effects. Continue your course of treatment even though you feel ill unless your care team tells you to stop. This medication may increase your risk of getting an infection. Call your care team for advice if you get a fever, chills, sore throat, or  other symptoms of a cold or flu. Do not treat yourself. Try to avoid being around people who are sick. Avoid taking medications that contain aspirin, acetaminophen, ibuprofen, naproxen, or ketoprofen unless instructed by your care team. These medications may hide a fever. Be careful brushing or flossing your teeth or using a toothpick because you may get an infection or bleed more easily. If you have any dental work done, tell your dentist you are receiving this medication. This medication can make you more sensitive to cold. Do not drink cold drinks or use ice. Cover exposed  skin before coming in contact with cold temperatures or cold objects. When out in cold weather wear warm clothing and cover your mouth and nose to warm the air that goes into your lungs. Tell your care team if you get sensitive to the cold. Talk to your care team if you or your partner are pregnant or think either of you might be pregnant. This medication can cause serious birth defects if taken during pregnancy and for 9 months after the last dose. A negative pregnancy test is required before starting this medication. A reliable form of contraception is recommended while taking this medication and for 9 months after the last dose. Talk to your care team about effective forms of contraception. Do not father a child while taking this medication and for 6 months after the last dose. Use a condom while having sex during this time period. Do not breastfeed while taking this medication and for 3 months after the last dose. This medication may cause infertility. Talk to your care team if you are concerned about your fertility. What side effects may I notice from receiving this medication? Side effects that you should report to your care team as soon as possible: Allergic reactions--skin rash, itching, hives, swelling of the face, lips, tongue, or throat Bleeding--bloody or black, tar-like stools, vomiting blood or brown material that looks like coffee grounds, red or dark brown urine, small red or purple spots on skin, unusual bruising or bleeding Dry cough, shortness of breath or trouble breathing Heart rhythm changes--fast or irregular heartbeat, dizziness, feeling faint or lightheaded, chest pain, trouble breathing Infection--fever, chills, cough, sore throat, wounds that don't heal, pain or trouble when passing urine, general feeling of discomfort or being unwell Liver injury--right upper belly pain, loss of appetite, nausea, light-colored stool, dark yellow or brown urine, yellowing skin or eyes, unusual  weakness or fatigue Low red blood cell level--unusual weakness or fatigue, dizziness, headache, trouble breathing Muscle injury--unusual weakness or fatigue, muscle pain, dark yellow or brown urine, decrease in amount of urine Pain, tingling, or numbness in the hands or feet Sudden and severe headache, confusion, change in vision, seizures, which may be signs of posterior reversible encephalopathy syndrome (PRES) Unusual bruising or bleeding Side effects that usually do not require medical attention (report to your care team if they continue or are bothersome): Diarrhea Nausea Pain, redness, or swelling with sores inside the mouth or throat Unusual weakness or fatigue Vomiting This list may not describe all possible side effects. Call your doctor for medical advice about side effects. You may report side effects to FDA at 1-800-FDA-1088. Where should I keep my medication? This medication is given in a hospital or clinic. It will not be stored at home. NOTE: This sheet is a summary. It may not cover all possible information. If you have questions about this medicine, talk to your doctor, pharmacist, or health care provider.  2024 Elsevier/Gold Standard (  2022-03-09 00:00:00) Leucovorin Injection What is this medication? LEUCOVORIN (loo koe VOR in) prevents side effects from certain medications, such as methotrexate. It works by increasing folate levels. This helps protect healthy cells in your body. It may also be used to treat anemia caused by low levels of folate. It can also be used with fluorouracil, a type of chemotherapy, to treat colorectal cancer. It works by increasing the effects of fluorouracil in the body. This medicine may be used for other purposes; ask your health care provider or pharmacist if you have questions. What should I tell my care team before I take this medication? They need to know if you have any of these conditions: Anemia from low levels of vitamin B12 in the  blood An unusual or allergic reaction to leucovorin, folic acid, other medications, foods, dyes, or preservatives Pregnant or trying to get pregnant Breastfeeding How should I use this medication? This medication is injected into a vein or a muscle. It is given by your care team in a hospital or clinic setting. Talk to your care team about the use of this medication in children. Special care may be needed. Overdosage: If you think you have taken too much of this medicine contact a poison control center or emergency room at once. NOTE: This medicine is only for you. Do not share this medicine with others. What if I miss a dose? Keep appointments for follow-up doses. It is important not to miss your dose. Call your care team if you are unable to keep an appointment. What may interact with this medication? Capecitabine Fluorouracil Phenobarbital Phenytoin Primidone Trimethoprim;sulfamethoxazole This list may not describe all possible interactions. Give your health care provider a list of all the medicines, herbs, non-prescription drugs, or dietary supplements you use. Also tell them if you smoke, drink alcohol, or use illegal drugs. Some items may interact with your medicine. What should I watch for while using this medication? Your condition will be monitored carefully while you are receiving this medication. This medication may increase the side effects of 5-fluorouracil. Tell your care team if you have diarrhea or mouth sores that do not get better or that get worse. What side effects may I notice from receiving this medication? Side effects that you should report to your care team as soon as possible: Allergic reactions--skin rash, itching, hives, swelling of the face, lips, tongue, or throat This list may not describe all possible side effects. Call your doctor for medical advice about side effects. You may report side effects to FDA at 1-800-FDA-1088. Where should I keep my  medication? This medication is given in a hospital or clinic. It will not be stored at home. NOTE: This sheet is a summary. It may not cover all possible information. If you have questions about this medicine, talk to your doctor, pharmacist, or health care provider.  2024 Elsevier/Gold Standard (2021-06-04 00:00:00) Fluorouracil Injection What is this medication? FLUOROURACIL (flure oh YOOR a sil) treats some types of cancer. It works by slowing down the growth of cancer cells. This medicine may be used for other purposes; ask your health care provider or pharmacist if you have questions. COMMON BRAND NAME(S): Adrucil What should I tell my care team before I take this medication? They need to know if you have any of these conditions: Blood disorders Dihydropyrimidine dehydrogenase (DPD) deficiency Infection, such as chickenpox, cold sores, herpes Kidney disease Liver disease Poor nutrition Recent or ongoing radiation therapy An unusual or allergic reaction to fluorouracil, other  medications, foods, dyes, or preservatives If you or your partner are pregnant or trying to get pregnant Breast-feeding How should I use this medication? This medication is injected into a vein. It is administered by your care team in a hospital or clinic setting. Talk to your care team about the use of this medication in children. Special care may be needed. Overdosage: If you think you have taken too much of this medicine contact a poison control center or emergency room at once. NOTE: This medicine is only for you. Do not share this medicine with others. What if I miss a dose? Keep appointments for follow-up doses. It is important not to miss your dose. Call your care team if you are unable to keep an appointment. What may interact with this medication? Do not take this medication with any of the following: Live virus vaccines This medication may also interact with the following: Medications that treat or  prevent blood clots, such as warfarin, enoxaparin, dalteparin This list may not describe all possible interactions. Give your health care provider a list of all the medicines, herbs, non-prescription drugs, or dietary supplements you use. Also tell them if you smoke, drink alcohol, or use illegal drugs. Some items may interact with your medicine. What should I watch for while using this medication? Your condition will be monitored carefully while you are receiving this medication. This medication may make you feel generally unwell. This is not uncommon as chemotherapy can affect healthy cells as well as cancer cells. Report any side effects. Continue your course of treatment even though you feel ill unless your care team tells you to stop. In some cases, you may be given additional medications to help with side effects. Follow all directions for their use. This medication may increase your risk of getting an infection. Call your care team for advice if you get a fever, chills, sore throat, or other symptoms of a cold or flu. Do not treat yourself. Try to avoid being around people who are sick. This medication may increase your risk to bruise or bleed. Call your care team if you notice any unusual bleeding. Be careful brushing or flossing your teeth or using a toothpick because you may get an infection or bleed more easily. If you have any dental work done, tell your dentist you are receiving this medication. Avoid taking medications that contain aspirin, acetaminophen, ibuprofen, naproxen, or ketoprofen unless instructed by your care team. These medications may hide a fever. Do not treat diarrhea with over the counter products. Contact your care team if you have diarrhea that lasts more than 2 days or if it is severe and watery. This medication can make you more sensitive to the sun. Keep out of the sun. If you cannot avoid being in the sun, wear protective clothing and sunscreen. Do not use sun lamps,  tanning beds, or tanning booths. Talk to your care team if you or your partner wish to become pregnant or think you might be pregnant. This medication can cause serious birth defects if taken during pregnancy and for 3 months after the last dose. A reliable form of contraception is recommended while taking this medication and for 3 months after the last dose. Talk to your care team about effective forms of contraception. Do not father a child while taking this medication and for 3 months after the last dose. Use a condom while having sex during this time period. Do not breastfeed while taking this medication. This medication may cause  infertility. Talk to your care team if you are concerned about your fertility. What side effects may I notice from receiving this medication? Side effects that you should report to your care team as soon as possible: Allergic reactions--skin rash, itching, hives, swelling of the face, lips, tongue, or throat Heart attack--pain or tightness in the chest, shoulders, arms, or jaw, nausea, shortness of breath, cold or clammy skin, feeling faint or lightheaded Heart failure--shortness of breath, swelling of the ankles, feet, or hands, sudden weight gain, unusual weakness or fatigue Heart rhythm changes--fast or irregular heartbeat, dizziness, feeling faint or lightheaded, chest pain, trouble breathing High ammonia level--unusual weakness or fatigue, confusion, loss of appetite, nausea, vomiting, seizures Infection--fever, chills, cough, sore throat, wounds that don't heal, pain or trouble when passing urine, general feeling of discomfort or being unwell Low red blood cell level--unusual weakness or fatigue, dizziness, headache, trouble breathing Pain, tingling, or numbness in the hands or feet, muscle weakness, change in vision, confusion or trouble speaking, loss of balance or coordination, trouble walking, seizures Redness, swelling, and blistering of the skin over hands and  feet Severe or prolonged diarrhea Unusual bruising or bleeding Side effects that usually do not require medical attention (report to your care team if they continue or are bothersome): Dry skin Headache Increased tears Nausea Pain, redness, or swelling with sores inside the mouth or throat Sensitivity to light Vomiting This list may not describe all possible side effects. Call your doctor for medical advice about side effects. You may report side effects to FDA at 1-800-FDA-1088. Where should I keep my medication? This medication is given in a hospital or clinic. It will not be stored at home. NOTE: This sheet is a summary. It may not cover all possible information. If you have questions about this medicine, talk to your doctor, pharmacist, or health care provider.  2024 Elsevier/Gold Standard (2021-05-07 00:00:00)

## 2022-06-30 NOTE — Progress Notes (Signed)
[facility] OFFICE PROGRESS NOTE   Diagnosis: Colon cancer  INTERVAL HISTORY:   April [name] completed another cycle of FOLFOX on 06/16/2022.  Mild nausea following chemotherapy.  Cold sensitivity lasted for several days.  No neuropathy symptoms at present.  No consistent diarrhea.  Objective:  Vital signs in last 24 hours:  Blood pressure 134/70, pulse 95, temperature 98.2 F (36.8 C), temperature source Oral, resp. rate 18, height 5\' 4"  (1.626 m), weight 146 lb 3.2 oz (66.3 kg), last menstrual period 01/07/2006, SpO2 100 %.    HEENT: No thrush or ulcers Resp: Lungs clear bilaterally Cardio: Regular rate and rhythm GI: No hepatosplenomegaly, tender in the right upper abdomen, no mass Vascular: No leg edema Neuro: The vibratory sense is intact at the fingertips bilaterally    Portacath/PICC-without erythema  Lab Results:  Lab Results  Component Value Date   WBC 15.0 (H) 06/30/2022   HGB 11.3 (L) 06/30/2022   HCT 34.2 (L) 06/30/2022   MCV 91.2 06/30/2022   PLT 172 06/30/2022   NEUTROABS 10.6 (H) 06/30/2022    CMP  Lab Results  Component Value Date   NA 138 06/30/2022   K 2.8 (L) 06/30/2022   CL 97 (L) 06/30/2022   CO2 33 (H) 06/30/2022   GLUCOSE 92 06/30/2022   BUN 7 06/30/2022   CREATININE 0.51 06/30/2022   CALCIUM 9.5 06/30/2022   PROT 7.8 06/30/2022   ALBUMIN 3.9 06/30/2022   AST 12 (L) 06/30/2022   ALT 7 06/30/2022   ALKPHOS 136 (H) 06/30/2022   BILITOT 0.2 (L) 06/30/2022   GFRNONAA >60 06/30/2022   GFRAA >60 10/17/2019    Lab Results  Component Value Date   CEA1 <0.6 03/23/2022   CEA 4.15 06/16/2022   ZOX096 213 (H) 03/23/2022     Medications: I have reviewed the patient's current medications.   Assessment/Plan: Sigmoid colon cancer, stage IV (pT4a,pN2b,M1c) Colonoscopy 03/24/2019-3 rectal polyps-hyperplastic polyps, distal colon biopsy-at least intramucosal adenocarcinoma, completely obstructing mass in the distal sigmoid  colon, could not be traversed, intact mismatch repair protein expression 03/24/2019-CEA 56.1 03/29/2019 CT abdomen/pelvis-circumferential thickening involving the entire mid and distal sigmoid colon to the level of the rectosigmoid junction, solid/cystic mass in the left ovary, bilateral nephrolithiasis Robotic assisted low anterior resection, mesenteric lymphadenectomy, bilateral salpingo-oophorectomy 04/08/2019 Pathology ([facility] review of outside pathology) metastatic adenocarcinoma involving the peritoneum overlying the round ligament, serosa of the urinary bladder, serosa of the right ureter a sacral area, pelvic peritoneum, and left ovary.  Omental biopsy with focal mucin pools with no carcinoma cells identified, right hemidiaphragm biopsy involved by metastatic adenocarcinoma, invasive adenocarcinoma the sigmoid colon, moderately differentiated, T4a, perineural and vascular invasion present, 7/22 lymph nodes, multiple tumor deposits, resection margins negative Negative for PD-L1, low probability of MSI-high, HER-2 negative, negative for BRAF, NRAS and KRAS alterations CTs 05/19/2019-no evidence of metastatic disease, findings suspicious for colitis of the transverse and ascending colon, small amount of ascites in the cul-de-sac, bilateral renal calculi Cycle 1 FOLFIRI 05/24/2019, bevacizumab added with cycle 2 Cycle 4 FOLFIRI/bevacizumab 07/05/2019 Cycle 5 FOLFIRI 07/27/2019, bevacizumab held secondary to hypertension Cycle 6 FOLFIRI 08/10/1999, bevacizumab held secondary to hypertension CTs 08/30/2019-no evidence of recurrent disease, new subsolid right lower lobe nodule felt to be inflammatory, emphysema Cycle 7 FOLFIRI 09/26/2019, bevacizumab remains on hold secondary to hypertension Cycle 8 FOLFIRI 10/17/2019, bevacizumab held secondary to hypertension, Udenyca added for neutropenia Cycle 9 FOLFIRI 11/07/2019, bevacizumab held secondary to hypertension, Udenyca Cycle 10 FOLFIRI 11/28/2019, bevacizumab  held,  Udenyca Cycle 11 FOLFIRI 12/26/2019, bevacizumab held, Udenyca CTs 01/25/2020-no evidence of recurrent disease, resolution of right lower lobe nodule Maintenance Xeloda beginning 02/06/2020 CTs 04/25/2020- no evidence of metastatic disease, multiple bilateral renal calculi without hydronephrosis Maintenance Xeloda continued CT abdomen/pelvis without contrast 08/10/2020-bilateral staghorn renal calculi, no evidence of metastatic disease; addendum 08/23/2020-small but increasing omental nodules. Cycle 1 FOLFIRI/panitumumab 09/19/2020 Cycle 2 FOLFIRI/Panitumumab 10/03/2020 Cycle 3 FOLFIRI/Panitumumab 10/17/2020 Cycle 4 FOLFIRI/panitumumab 10/31/2020 Cycle 5 FOLFIRI/Panitumumab 11/14/2020 CTs 11/26/2020-stable omental metastases compared to 10/22/2020, mildly decreased from 08/10/2020.  Left lower quadrant tiny paracolic gutter implant slightly decreased from CT 08/10/2020.  No new or progressive metastatic disease in the abdomen or pelvis. Cycle 6 FOLFIRI/Panitumumab 11/28/2020, irinotecan dose reduced, treatment schedule adjusted to every 3 weeks going forward Cycle 7 FOLFIRI/panitumumab 12/24/2020 Cycle 8 FOLFIRI/Panitumumab 01/16/2021--treatment held due to hypertension in the infusion area. Cycle 8 FOLFIRI/panitumumab 01/21/2021 Cycle 9 FOLFIRI/panitumumab 02/11/2021 Cycle 10 FOLFIRI/Panitumumab 03/04/2021 Cycle 11 FOLFIRI/Panitumumab 03/25/2021 CT abdomen/pelvis 04/10/2021-stable right omental and left posterior paracolic gutter nodules Cycle 12 FOLFIRI/panitumumab 04/15/2021 Cycle 13 FOLFIRI/panitumumab 05/06/2021 Cycle 14 FOLFIRI/Panitumumab 05/27/2021 Cycle 15 FOLFIRI/Panitumumab 06/17/2021 CT abdomen/pelvis 07/05/2021-slight enlargement of a dominant right omental implant, no new implants Patient requested a treatment break CT abdomen/pelvis 10/04/2021-mild increase in size of multiple omental soft tissue nodules, 4 mm calculus in the distal left ureter adjacent to the ureteral stent Cycle 1 Lonsurf  10/14/2021 10/28/2021 Avastin every 2 weeks Cycle 2 Lonsurf 11/11/2021 Cycle 3 Lonsurf 12/09/2021 Cycle 4 Lonsurf 01/06/2022 Avastin held 01/20/2022 due to proteinuria, 24-hour urine 43 mg CT/pelvis 01/30/2022-possible new peritoneal implant at the transverse colon, no significant change in other omental/peritoneal implants, stable chronic rectal wall thickening, status post removal of double-J left ureteral stent with no evidence of hydronephrosis or ureteral calculus, nonobstructing bilateral renal calculi Cycle 5 Lonsurf 02/05/2022 or 02/06/2022 Avastin every 2 weeks Cycle 6 Lonsurf 03/03/2022 CTs 04/05/2022-increase in size and number of peritoneal implants compared to January 2024 central liver mass narrowing the anterior branch of the right portal vein, decreased biliary duct dilation, biliary stents in place, stable right cardiophrenic lymph nodes, separate pigtail stent in the lumen of the proximal duodenum Cycle 1 FOLFOX 04/14/2022 CT abdomen/pelvis 04/16/2022-stent within the third portion of the duodenum, stable peritoneal nodules, indwelling biliary stents with mild left biliary duct dilation Cycle 2 FOLFOX 04/28/2022, Emend and prophylactic dexamethasone added Cycle 3 FOLFOX 05/12/2022, Aloxi, Emend, prophylactic dexamethasone, Compazine and lorazepam as needed Chemotherapy held 05/25/2020 due to severe hypertension, evaluated in the emergency department Cycle 4 FOLFOX 06/03/2022 Cycle 5 FOLFOX 06/16/2022, oxaliplatin dose reduced and 5-FU bolus eliminated due to neutropenia CT 06/25/2022-unchanged soft tissue at the hepatic hilum, unchanged, bile duct stent, decreased size of peritoneal and omental nodules and diminished volume of likely fluid in the lower left pelvis Cycle 6 FOLFOX 06/30/2022   Hypertension G4 P3, twins Kidney stones-bilateral staghorn renal calculi on CT 08/10/2020 Infected Port-A-Cath 09/12/2019-placed on Augmentin, referred for Port-A-Cath removal; Port-A-Cath removed 09/14/2019;  PICC line placed 09/14/2019; culture staph aureus.  Course of Septra completed. Neutropenia secondary to chemotherapy-Udenyca added with cycle 8 FOLFIRI Right nephrostomy tube 09/12/2020; stent placed 10/09/2020, percutaneous nephrostolithotomy treatment of right-sided kidney stones 10/09/2020, malpositioned right ureter stent replacement 10/23/2020, right ureter stent removed 10/29/2020 Percutaneous left nephrostolithotomy 10/01/2021-no renal stone identified, left ureter stent left in place Cystoscopy 10/14/2021-stent removed 8.  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  Admission 04/16/2022 with nausea/vomiting and abdominal pain, felt to be acute toxicity related to chemotherapy, discharged home 04/18/2022    Disposition: April [name] appears stable.  She has completed 5 cycles of FOLFOX.  She has tolerated the chemotherapy well.  The CEA is lower and the restaging CT is consistent with a response to FOLFOX.  I reviewed the CT findings and images with April [name].  The plan is to proceed with FOLFOX chemotherapy.  She will complete another cycle today.  The potassium is low.  We will check a magnesium level and provide potassium/magnesium supplements as indicated.  Thornton Papas, MD  06/30/2022  8:59 AM

## 2022-06-30 NOTE — Progress Notes (Signed)
Patient seen by Dr. Truett Perna today  Vitals are within treatment parameters.  Labs reviewed by Dr. Truett Perna and are not all within treatment parameters. K+ 2.8-OK to proceed. Will start oral KCl at home. Possible IV based on Mg+ (could potentially administer on Wednesday)  Per physician team, patient is ready for treatment and there are NO modifications to the treatment plan. Will follow up on Mg+.

## 2022-07-01 ENCOUNTER — Ambulatory Visit (INDEPENDENT_AMBULATORY_CARE_PROVIDER_SITE_OTHER): Payer: 59 | Admitting: Nurse Practitioner

## 2022-07-01 ENCOUNTER — Encounter: Payer: Self-pay | Admitting: Nurse Practitioner

## 2022-07-01 VITALS — BP 126/95 | HR 85 | Ht 64.0 in | Wt 148.8 lb

## 2022-07-01 DIAGNOSIS — I10 Essential (primary) hypertension: Secondary | ICD-10-CM | POA: Diagnosis not present

## 2022-07-01 DIAGNOSIS — C20 Malignant neoplasm of rectum: Secondary | ICD-10-CM | POA: Diagnosis not present

## 2022-07-01 DIAGNOSIS — R519 Headache, unspecified: Secondary | ICD-10-CM

## 2022-07-01 MED ORDER — AMLODIPINE BESYLATE 5 MG PO TABS: 5.0000 mg | ORAL_TABLET | Freq: Every day | ORAL | 3 refills | Status: DC

## 2022-07-01 NOTE — Progress Notes (Signed)
April Clamp, DNP, AGNP-c [facility]  [address] [place], [place] [address] [phone]  ESTABLISHED PATIENT- Chronic Health and/or Follow-Up Visit  Blood pressure (!) 126/95, pulse 85, height 5\' 4"  (1.626 m), weight 148 lb 12.8 oz (67.5 kg), last menstrual period 01/07/2006, SpO2 97%.    April [name] is a 47 y.o. year old female presenting today for evaluation and management of chronic conditions.   Headaches and Blood Pressure Kiylee reports that she has been experiencing headaches for the past week, with blood pressure readings around 110/70. She also mentions a slight headache today, which she attributes to not taking her blood pressure medication. She has been taking her blood pressure at home and provided a log of her readings. The patient reports that her headaches have not improved since stopping her blood pressure medication, chlorthalidone. Her at home readings appear stable.   Chemotherapy Side Effects   The patient is currently undergoing chemotherapy and reports feeling okay overall, but experiences indigestion, off-balance sensations, and profuse sweating as side effects.  Other Symptoms Additionally, she reports feeling tired and experiencing chills on the inside a few days ago, which she believes may be due to her body fighting off an infection. She has been allowing herself to rest and sleep when feeling this way.    All ROS negative with exception of what is listed above.   PHYSICAL EXAM Physical Exam Constitutional:      Appearance: Normal appearance.  HENT:     Head: Normocephalic.  Eyes:     General: No scleral icterus.    Conjunctiva/sclera: Conjunctivae normal.  Neck:     Vascular: No carotid bruit.  Cardiovascular:     Rate and Rhythm: Normal rate and regular rhythm.     Pulses: Normal pulses.     Heart sounds: Normal heart sounds.  Pulmonary:     Effort: Pulmonary effort is normal.     Breath sounds: Normal breath  sounds.  Abdominal:     Palpations: Abdomen is soft.  Musculoskeletal:     Cervical back: Normal range of motion.     Right lower leg: No edema.     Left lower leg: No edema.  Skin:    General: Skin is warm and dry.     Capillary Refill: Capillary refill takes less than 2 seconds.  Neurological:     General: No focal deficit present.     Mental Status: She is alert and oriented to person, place, and time.     Cranial Nerves: No cranial nerve deficit.     Motor: No weakness.     Coordination: Coordination normal.  Psychiatric:        Mood and Affect: Mood normal.     PLAN Problem List Items Addressed This Visit     Hypertension - Primary    Patient reports headaches when blood pressure was around 110/70. She has not been taking amlodipine and has been off chlorthalidone due to concerns of low blood pressure. Currently experiencing a mild headache. Her blood pressure is slightly elevated today at 126/95. I am not sure there is a direct correlation with her BP and the headaches at this time given the persistent headache when BP his low and high.  Plan: - Restart amlodipine at 5 mg daily for blood pressure management. - Continue to hold chlorthalidone unless blood pressure runs high (>140/90), then you may take half of a chlorthalidone tablet as needed. - Monitor blood pressure at home and report any significant changes or  persistent headaches.      Rectal cancer [facility])    Patient is currently undergoing chemotherapy (cycle 16 of 18) and reports indigestion, off-balance feeling, and excessive sweating with yellow staining on sheets. Plan: - Encourage patient to restart Pantoprazole (Protonix) for indigestion management. - Advise patient to rest and sleep when feeling fatigued or unwell to support immune system. - Monitor side effects and report any new or worsening symptoms.      Acute nonintractable headache    Patient has been using Tylenol for headache relief and reports it as  effective. It is not clear if the headaches are directly correlating with blood pressure as they are present when blood pressures are high and low.  Plan:   - Continue using Tylenol as needed for headache relief. - Monitor effectiveness and report any changes in headache frequency or severity.       Return in about 6 months (around 12/31/2022).  Time: 35 minutes, >50% spent counseling, care coordination, chart review, and documentation.   April Clamp, DNP, AGNP-c

## 2022-07-01 NOTE — Patient Instructions (Signed)
If your blood pressures are doing good continue on the 5mg  of amlodipine. You can take 1/2 chlorthalidone as needed on days your BP is running high.

## 2022-07-02 ENCOUNTER — Inpatient Hospital Stay: Payer: 59

## 2022-07-02 VITALS — BP 124/88 | HR 83 | Temp 98.2°F | Resp 18

## 2022-07-02 DIAGNOSIS — C19 Malignant neoplasm of rectosigmoid junction: Secondary | ICD-10-CM

## 2022-07-02 DIAGNOSIS — C187 Malignant neoplasm of sigmoid colon: Secondary | ICD-10-CM | POA: Diagnosis not present

## 2022-07-02 DIAGNOSIS — C786 Secondary malignant neoplasm of retroperitoneum and peritoneum: Secondary | ICD-10-CM | POA: Diagnosis not present

## 2022-07-02 DIAGNOSIS — Z5111 Encounter for antineoplastic chemotherapy: Secondary | ICD-10-CM | POA: Diagnosis not present

## 2022-07-02 DIAGNOSIS — Z79899 Other long term (current) drug therapy: Secondary | ICD-10-CM | POA: Diagnosis not present

## 2022-07-02 DIAGNOSIS — Z5189 Encounter for other specified aftercare: Secondary | ICD-10-CM | POA: Diagnosis not present

## 2022-07-02 MED ORDER — PEGFILGRASTIM-CBQV 6 MG/0.6ML ~~LOC~~ SOSY
6.0000 mg | PREFILLED_SYRINGE | Freq: Once | SUBCUTANEOUS | Status: AC
  Administered 2022-07-02: 6 mg via SUBCUTANEOUS
  Filled 2022-07-02: qty 0.6

## 2022-07-02 MED ORDER — HEPARIN SOD (PORK) LOCK FLUSH 100 UNIT/ML IV SOLN
500.0000 [IU] | Freq: Once | INTRAVENOUS | Status: AC | PRN
  Administered 2022-07-02: 500 [IU]

## 2022-07-02 MED ORDER — SODIUM CHLORIDE 0.9% FLUSH
10.0000 mL | INTRAVENOUS | Status: DC | PRN
  Administered 2022-07-02: 10 mL

## 2022-07-02 NOTE — Patient Instructions (Signed)

## 2022-07-03 ENCOUNTER — Other Ambulatory Visit: Payer: Self-pay

## 2022-07-08 ENCOUNTER — Telehealth: Payer: Self-pay | Admitting: Oncology

## 2022-07-09 ENCOUNTER — Inpatient Hospital Stay: Payer: 59

## 2022-07-09 VITALS — BP 138/96 | HR 85 | Temp 98.1°F | Resp 18

## 2022-07-09 DIAGNOSIS — Z5111 Encounter for antineoplastic chemotherapy: Secondary | ICD-10-CM | POA: Diagnosis not present

## 2022-07-09 DIAGNOSIS — Z79899 Other long term (current) drug therapy: Secondary | ICD-10-CM | POA: Diagnosis not present

## 2022-07-09 DIAGNOSIS — C187 Malignant neoplasm of sigmoid colon: Secondary | ICD-10-CM | POA: Diagnosis not present

## 2022-07-09 DIAGNOSIS — C786 Secondary malignant neoplasm of retroperitoneum and peritoneum: Secondary | ICD-10-CM | POA: Diagnosis not present

## 2022-07-09 DIAGNOSIS — Z452 Encounter for adjustment and management of vascular access device: Secondary | ICD-10-CM

## 2022-07-09 DIAGNOSIS — Z5189 Encounter for other specified aftercare: Secondary | ICD-10-CM | POA: Diagnosis not present

## 2022-07-09 MED ORDER — HEPARIN SOD (PORK) LOCK FLUSH 100 UNIT/ML IV SOLN
250.0000 [IU] | Freq: Once | INTRAVENOUS | Status: AC
  Administered 2022-07-09: 250 [IU] via INTRAVENOUS

## 2022-07-09 MED ORDER — SODIUM CHLORIDE 0.9% FLUSH
10.0000 mL | Freq: Once | INTRAVENOUS | Status: AC
  Administered 2022-07-09: 10 mL via INTRAVENOUS

## 2022-07-09 NOTE — Patient Instructions (Signed)
PICC Home Care Guide A peripherally inserted central catheter (PICC) is a form of IV access that allows medicines and IV fluids to be quickly put into the blood and spread throughout the body. The PICC is a long, thin, flexible tube (catheter) that is put into a vein in a person's arm or leg. The catheter ends in a large vein just outside the heart called the superior vena cava (SVC). After the PICC is put in, a chest X-ray may be done to make sure that it is in the right place. A PICC may be placed for different reasons, such as: To give medicines and liquid nutrition. To give IV fluids and blood products. To take blood samples often. If there is trouble placing a peripheral intravenous (PIV) catheter. If cared for properly, a PICC can remain in place for many months. Having a PICC can allow you to go home from the hospital sooner and continue treatment at home. Medicines and PICC care can be managed at home by a family member, caregiver, or home health care team. What are the risks? Generally, having a PICC is safe. However, problems may occur, including: A blood clot (thrombus) forming in or at the end of the PICC. A blood clot forming in a vein (deep vein thrombosis) or traveling to the lung (pulmonary embolism). Inflammation of the vein (phlebitis) in which the PICC is placed. Infection at the insertion site or in the blood. Blood infections from central lines, like PICCs, can be serious and often require a hospital stay. PICC malposition, or PICC movement or poor placement. A break or cut in the PICC. Do not use scissors near the PICC. Nerve or tendon irritation or injury during PICC insertion. How to care for your PICC Please follow the specific guidelines provided by your health care provider. Preventing infection You and any caregivers should wash your hands often with soap and water for at least 20 seconds. Wash hands: Before touching the PICC or the infusion device. Before changing a  bandage (dressing). Do not change the dressing unless you have been taught to do so and have shown you are able to change it safely. Flush the PICC as told. Tell your health care provider right away if the PICC is hard to flush or does not flush. Do not use force to flush the PICC. Use clean and germ-free (sterile) supplies only. Keep the supplies in a dry place. Do not reuse needles, syringes, or any other supplies. Reusing supplies can lead to infection. Keep the PICC dressing dry and secure it with tape if the edges stop sticking to your skin. Check your PICC insertion site every day for signs of infection. Check for: Redness, swelling, or pain. Fluid or blood. Warmth. Pus or a bad smell. Preventing other problems Do not use a syringe that is less than 10 mL to flush the PICC. Do not have your blood pressure checked on the arm in which the PICC is placed. Do not ever pull or tug on the PICC. Keep it secured to your arm with tape or a stretch wrap when not in use. Do not take the PICC out yourself. Only a trained health care provider should remove the PICC. Keep pets and children away from your PICC. How to care for your PICC dressing Keep your PICC dressing clean and dry to prevent infection. Do not take baths, swim, or use a hot tub until your health care provider approves. Ask your health care provider if you can take   showers. You may only be allowed to take sponge baths. When you are allowed to shower: Ask your health care provider to teach you how to wrap the PICC. Cover the PICC with clear plastic wrap and tape to keep it dry while showering. Follow instructions from your health care provider about how to take care of your insertion site and dressing. Make sure you: Wash your hands with soap and water for at least 20 seconds before and after you change your dressing. If soap and water are not available, use hand sanitizer. Change your dressing only if taught to do so by your health care  provider. Your PICC dressing needs to be changed if it becomes loose or wet. Leave stitches (sutures), skin glue, or adhesive strips in place. These skin closures may need to stay in place for 2 weeks or longer. If adhesive strip edges start to loosen and curl up, you may trim the loose edges. Do not remove adhesive strips completely unless your health care provider tells you to do that. Follow these instructions at home: Disposal of supplies Throw away any syringes in a disposal container that is meant for sharp items (sharps container). You can buy a sharps container from a pharmacy, or you can make one by using an empty, hard plastic bottle with a lid. Place any used dressings or infusion bags into a plastic bag. Throw that bag in the trash. General instructions  Always carry your PICC identification card or wear a medical alert bracelet. Keep the tube clamped at all times, unless it is being used. Always carry a smooth-edge clamp with you to clamp the PICC if it breaks. Do not use scissors or sharp objects near the tube. You may bend your arm and move it freely. If your PICC is near or at the bend of your elbow, avoid activity with repeated motion at the elbow. Avoid lifting heavy objects as told by your health care provider. Keep all follow-up visits. This is important. You will need to have your PICC dressing changed at least once a week. Contact a health care provider if: You have pain in your arm, ear, face, or teeth. You have a fever or chills. You have redness, swelling, or pain around the insertion site. You have fluid or blood coming from the insertion site. Your insertion site feels warm to the touch. You have pus or a bad smell coming from the insertion site. Your skin feels hard and raised around the insertion site. Your PICC dressing has gotten wet or is coming off and you have not been taught how to change it. Get help right away if: You have problems with your PICC, such as  your PICC: Was tugged or pulled and has partially come out. Do not  push the PICC back in. Cannot be flushed, is hard to flush, or leaks around the insertion site when it is flushed. Makes a flushing sound when it is flushed. Appears to have a hole or tear. Is accidentally pulled all the way out. If this happens, cover the insertion site with a gauze dressing. Do not throw the PICC away. Your health care provider will need to check it to be sure the entire catheter came out. You feel your heart racing or skipping beats, or you have chest pain. You have shortness of breath or trouble breathing. You have swelling, redness, warmth, or pain in the arm in which the PICC is placed. You have a red streak going up your arm that   starts under the PICC dressing. These symptoms may be an emergency. Get help right away. Call 911. Do not wait to see if the symptoms will go away. Do not drive yourself to the hospital. Summary A peripherally inserted central catheter (PICC) is a long, thin, flexible tube (catheter) that is put into a vein in the arm or leg. If cared for properly, a PICC can remain in place for many months. Having a PICC can allow you to go home from the hospital sooner and continue treatment at home. The PICC is inserted using a germ-free (sterile) technique by a specially trained health care provider. Only a trained health care provider should remove it. Do not have your blood pressure checked on the arm in which your PICC is placed. Always keep your PICC identification card with you. This information is not intended to replace advice given to you by your health care provider. Make sure you discuss any questions you have with your health care provider. Document Revised: 07/18/2020 Document Reviewed: 07/18/2020 Elsevier Patient Education  2024 [employer]  

## 2022-07-12 NOTE — Telephone Encounter (Signed)
done

## 2022-07-16 ENCOUNTER — Inpatient Hospital Stay: Payer: 59 | Attending: Oncology

## 2022-07-16 ENCOUNTER — Encounter: Payer: Self-pay | Admitting: *Deleted

## 2022-07-16 ENCOUNTER — Inpatient Hospital Stay: Payer: 59 | Admitting: Nutrition

## 2022-07-16 VITALS — BP 144/90 | HR 96 | Temp 98.1°F | Resp 18

## 2022-07-16 DIAGNOSIS — C786 Secondary malignant neoplasm of retroperitoneum and peritoneum: Secondary | ICD-10-CM | POA: Diagnosis present

## 2022-07-16 DIAGNOSIS — Z79899 Other long term (current) drug therapy: Secondary | ICD-10-CM | POA: Insufficient documentation

## 2022-07-16 DIAGNOSIS — C187 Malignant neoplasm of sigmoid colon: Secondary | ICD-10-CM | POA: Diagnosis present

## 2022-07-16 DIAGNOSIS — Z5111 Encounter for antineoplastic chemotherapy: Secondary | ICD-10-CM | POA: Diagnosis present

## 2022-07-16 DIAGNOSIS — Z452 Encounter for adjustment and management of vascular access device: Secondary | ICD-10-CM

## 2022-07-16 MED ORDER — SODIUM CHLORIDE 0.9% FLUSH
10.0000 mL | Freq: Once | INTRAVENOUS | Status: AC
  Administered 2022-07-16: 10 mL via INTRAVENOUS

## 2022-07-16 MED ORDER — HEPARIN SOD (PORK) LOCK FLUSH 100 UNIT/ML IV SOLN
250.0000 [IU] | Freq: Once | INTRAVENOUS | Status: AC
  Administered 2022-07-16: 250 [IU] via INTRAVENOUS

## 2022-07-16 NOTE — Patient Instructions (Signed)
PICC Home Care Guide A peripherally inserted central catheter (PICC) is a form of IV access that allows medicines and IV fluids to be quickly put into the blood and spread throughout the body. The PICC is a long, thin, flexible tube (catheter) that is put into a vein in a person's arm or leg. The catheter ends in a large vein just outside the heart called the superior vena cava (SVC). After the PICC is put in, a chest X-ray may be done to make sure that it is in the right place. A PICC may be placed for different reasons, such as: To give medicines and liquid nutrition. To give IV fluids and blood products. To take blood samples often. If there is trouble placing a peripheral intravenous (PIV) catheter. If cared for properly, a PICC can remain in place for many months. Having a PICC can allow you to go home from the hospital sooner and continue treatment at home. Medicines and PICC care can be managed at home by a family member, caregiver, or home health care team. What are the risks? Generally, having a PICC is safe. However, problems may occur, including: A blood clot (thrombus) forming in or at the end of the PICC. A blood clot forming in a vein (deep vein thrombosis) or traveling to the lung (pulmonary embolism). Inflammation of the vein (phlebitis) in which the PICC is placed. Infection at the insertion site or in the blood. Blood infections from central lines, like PICCs, can be serious and often require a hospital stay. PICC malposition, or PICC movement or poor placement. A break or cut in the PICC. Do not use scissors near the PICC. Nerve or tendon irritation or injury during PICC insertion. How to care for your PICC Please follow the specific guidelines provided by your health care provider. Preventing infection You and any caregivers should wash your hands often with soap and water for at least 20 seconds. Wash hands: Before touching the PICC or the infusion device. Before changing a  bandage (dressing). Do not change the dressing unless you have been taught to do so and have shown you are able to change it safely. Flush the PICC as told. Tell your health care provider right away if the PICC is hard to flush or does not flush. Do not use force to flush the PICC. Use clean and germ-free (sterile) supplies only. Keep the supplies in a dry place. Do not reuse needles, syringes, or any other supplies. Reusing supplies can lead to infection. Keep the PICC dressing dry and secure it with tape if the edges stop sticking to your skin. Check your PICC insertion site every day for signs of infection. Check for: Redness, swelling, or pain. Fluid or blood. Warmth. Pus or a bad smell. Preventing other problems Do not use a syringe that is less than 10 mL to flush the PICC. Do not have your blood pressure checked on the arm in which the PICC is placed. Do not ever pull or tug on the PICC. Keep it secured to your arm with tape or a stretch wrap when not in use. Do not take the PICC out yourself. Only a trained health care provider should remove the PICC. Keep pets and children away from your PICC. How to care for your PICC dressing Keep your PICC dressing clean and dry to prevent infection. Do not take baths, swim, or use a hot tub until your health care provider approves. Ask your health care provider if you can take   showers. You may only be allowed to take sponge baths. When you are allowed to shower: Ask your health care provider to teach you how to wrap the PICC. Cover the PICC with clear plastic wrap and tape to keep it dry while showering. Follow instructions from your health care provider about how to take care of your insertion site and dressing. Make sure you: Wash your hands with soap and water for at least 20 seconds before and after you change your dressing. If soap and water are not available, use hand sanitizer. Change your dressing only if taught to do so by your health care  provider. Your PICC dressing needs to be changed if it becomes loose or wet. Leave stitches (sutures), skin glue, or adhesive strips in place. These skin closures may need to stay in place for 2 weeks or longer. If adhesive strip edges start to loosen and curl up, you may trim the loose edges. Do not remove adhesive strips completely unless your health care provider tells you to do that. Follow these instructions at home: Disposal of supplies Throw away any syringes in a disposal container that is meant for sharp items (sharps container). You can buy a sharps container from a pharmacy, or you can make one by using an empty, hard plastic bottle with a lid. Place any used dressings or infusion bags into a plastic bag. Throw that bag in the trash. General instructions  Always carry your PICC identification card or wear a medical alert bracelet. Keep the tube clamped at all times, unless it is being used. Always carry a smooth-edge clamp with you to clamp the PICC if it breaks. Do not use scissors or sharp objects near the tube. You may bend your arm and move it freely. If your PICC is near or at the bend of your elbow, avoid activity with repeated motion at the elbow. Avoid lifting heavy objects as told by your health care provider. Keep all follow-up visits. This is important. You will need to have your PICC dressing changed at least once a week. Contact a health care provider if: You have pain in your arm, ear, face, or teeth. You have a fever or chills. You have redness, swelling, or pain around the insertion site. You have fluid or blood coming from the insertion site. Your insertion site feels warm to the touch. You have pus or a bad smell coming from the insertion site. Your skin feels hard and raised around the insertion site. Your PICC dressing has gotten wet or is coming off and you have not been taught how to change it. Get help right away if: You have problems with your PICC, such as  your PICC: Was tugged or pulled and has partially come out. Do not  push the PICC back in. Cannot be flushed, is hard to flush, or leaks around the insertion site when it is flushed. Makes a flushing sound when it is flushed. Appears to have a hole or tear. Is accidentally pulled all the way out. If this happens, cover the insertion site with a gauze dressing. Do not throw the PICC away. Your health care provider will need to check it to be sure the entire catheter came out. You feel your heart racing or skipping beats, or you have chest pain. You have shortness of breath or trouble breathing. You have swelling, redness, warmth, or pain in the arm in which the PICC is placed. You have a red streak going up your arm that   starts under the PICC dressing. These symptoms may be an emergency. Get help right away. Call 911. Do not wait to see if the symptoms will go away. Do not drive yourself to the hospital. Summary A peripherally inserted central catheter (PICC) is a long, thin, flexible tube (catheter) that is put into a vein in the arm or leg. If cared for properly, a PICC can remain in place for many months. Having a PICC can allow you to go home from the hospital sooner and continue treatment at home. The PICC is inserted using a germ-free (sterile) technique by a specially trained health care provider. Only a trained health care provider should remove it. Do not have your blood pressure checked on the arm in which your PICC is placed. Always keep your PICC identification card with you. This information is not intended to replace advice given to you by your health care provider. Make sure you discuss any questions you have with your health care provider. Document Revised: 07/18/2020 Document Reviewed: 07/18/2020 Elsevier Patient Education  2024 [employer]  

## 2022-07-16 NOTE — Progress Notes (Signed)
Message from infusion nurse: Patient is here for picc line dressing change.  She met with Lesle Reek today and "Says her stools have been pasty and dark green and yellow like the color of saffron. Not consistently but yesterday and on and off before."  Patient denies bright red or coffee ground stools.  Patient has not other complaints.  Per Dr. Truett Perna: This can just be a normal stool variation. Not concerned.

## 2022-07-16 NOTE — Progress Notes (Signed)
Nutrition follow up completed with patient being treated for progressive colon cancer.  Weight decreased to 147.8 pounds today, from 153 pounds April 3. This is 3% loss over 3 months.  Reports nausea is intermittent but worse after chemotherapy. She describes early satiety. She has occasional pain with oral intake. States she is happy she is having a bowel movement however, they are "sometimes" dark green in color or the color of "saffron". This is not happening consistently but intermittently. She does not think it is blood. She drinks Ensure when she can afford to purchase them. She has been trying to cook and eat more vegetables.  Nutrition Diagnosis: Food and Nutrition Related Knowledge Deficit continues.  Intervention: Educated on importance of taking anti emetics when having nausea.  Recommended bland, low fat foods with nausea and provided nutrition fact sheet on nausea and vomiting. Encouraged small frequent meals/snacks throughout the day. Recommended 1-2 cartons of Ensure Plus or Equivalent. Provided coupons. Patient provided with one complimentary case of ensure due to one or more of the following reasons:  malnutrition, weight loss/maintenance, poor appetite AND food insecurity or financial hardship.    Provided recipes at patient request.  Monitoring, Evaluation, Goals: Tolerate oral intake to minimize weight loss.  Next Visit: Follow as needed.

## 2022-07-18 ENCOUNTER — Encounter: Payer: Self-pay | Admitting: Oncology

## 2022-07-20 ENCOUNTER — Other Ambulatory Visit: Payer: Self-pay | Admitting: Oncology

## 2022-07-21 ENCOUNTER — Inpatient Hospital Stay: Payer: 59

## 2022-07-21 ENCOUNTER — Encounter: Payer: Self-pay | Admitting: *Deleted

## 2022-07-21 ENCOUNTER — Other Ambulatory Visit (HOSPITAL_BASED_OUTPATIENT_CLINIC_OR_DEPARTMENT_OTHER): Payer: Self-pay

## 2022-07-21 ENCOUNTER — Inpatient Hospital Stay (HOSPITAL_BASED_OUTPATIENT_CLINIC_OR_DEPARTMENT_OTHER): Payer: 59 | Admitting: Oncology

## 2022-07-21 VITALS — BP 142/80 | HR 87 | Temp 98.2°F | Resp 20 | Ht 64.0 in | Wt 152.0 lb

## 2022-07-21 DIAGNOSIS — C20 Malignant neoplasm of rectum: Secondary | ICD-10-CM | POA: Diagnosis not present

## 2022-07-21 DIAGNOSIS — C19 Malignant neoplasm of rectosigmoid junction: Secondary | ICD-10-CM

## 2022-07-21 DIAGNOSIS — C786 Secondary malignant neoplasm of retroperitoneum and peritoneum: Secondary | ICD-10-CM | POA: Diagnosis not present

## 2022-07-21 DIAGNOSIS — Z5111 Encounter for antineoplastic chemotherapy: Secondary | ICD-10-CM | POA: Diagnosis not present

## 2022-07-21 DIAGNOSIS — R519 Headache, unspecified: Secondary | ICD-10-CM

## 2022-07-21 LAB — CBC WITH DIFFERENTIAL (CANCER CENTER ONLY)
Abs Immature Granulocytes: 0.02 10*3/uL (ref 0.00–0.07)
Basophils Absolute: 0.1 10*3/uL (ref 0.0–0.1)
Basophils Relative: 1 %
Eosinophils Absolute: 0.3 10*3/uL (ref 0.0–0.5)
Eosinophils Relative: 4 %
HCT: 35.2 % — ABNORMAL LOW (ref 36.0–46.0)
Hemoglobin: 11.5 g/dL — ABNORMAL LOW (ref 12.0–15.0)
Immature Granulocytes: 0 %
Lymphocytes Relative: 38 %
Lymphs Abs: 2.4 10*3/uL (ref 0.7–4.0)
MCH: 31 pg (ref 26.0–34.0)
MCHC: 32.7 g/dL (ref 30.0–36.0)
MCV: 94.9 fL (ref 80.0–100.0)
Monocytes Absolute: 0.9 10*3/uL (ref 0.1–1.0)
Monocytes Relative: 14 %
Neutro Abs: 2.7 10*3/uL (ref 1.7–7.7)
Neutrophils Relative %: 43 %
Platelet Count: 214 10*3/uL (ref 150–400)
RBC: 3.71 MIL/uL — ABNORMAL LOW (ref 3.87–5.11)
RDW: 18.2 % — ABNORMAL HIGH (ref 11.5–15.5)
WBC Count: 6.3 10*3/uL (ref 4.0–10.5)
nRBC: 0 % (ref 0.0–0.2)

## 2022-07-21 LAB — CMP (CANCER CENTER ONLY)
ALT: 10 U/L (ref 0–44)
AST: 14 U/L — ABNORMAL LOW (ref 15–41)
Albumin: 4.1 g/dL (ref 3.5–5.0)
Alkaline Phosphatase: 131 U/L — ABNORMAL HIGH (ref 38–126)
Anion gap: 5 (ref 5–15)
BUN: 11 mg/dL (ref 6–20)
CO2: 28 mmol/L (ref 22–32)
Calcium: 9.2 mg/dL (ref 8.9–10.3)
Chloride: 107 mmol/L (ref 98–111)
Creatinine: 0.52 mg/dL (ref 0.44–1.00)
GFR, Estimated: >60 mL/min (ref >60)
Glucose, Bld: 93 mg/dL (ref 70–99)
Potassium: 3.9 mmol/L (ref 3.5–5.1)
Sodium: 140 mmol/L (ref 135–145)
Total Bilirubin: 0.2 mg/dL — ABNORMAL LOW (ref 0.3–1.2)
Total Protein: 7.3 g/dL (ref 6.5–8.1)

## 2022-07-21 MED ORDER — FLUOROURACIL CHEMO INJECTION 2.5 GM/50ML
400.0000 mg/m2 | Freq: Once | INTRAVENOUS | Status: AC
  Administered 2022-07-21: 700 mg via INTRAVENOUS
  Filled 2022-07-21: qty 14

## 2022-07-21 MED ORDER — DEXTROSE 5 % IV SOLN: Freq: Once | INTRAVENOUS | Status: AC

## 2022-07-21 MED ORDER — HYDROCODONE-ACETAMINOPHEN 10-325 MG PO TABS
1.0000 | ORAL_TABLET | Freq: Four times a day (QID) | ORAL | 0 refills | Status: DC | PRN
Start: 2022-07-21 — End: 2022-08-18
  Filled 2022-07-21: qty 45, 12d supply, fill #0

## 2022-07-21 MED ORDER — SODIUM CHLORIDE 0.9 % IV SOLN
3100.0000 mg | INTRAVENOUS | Status: DC
  Administered 2022-07-21: 3100 mg via INTRAVENOUS
  Filled 2022-07-21: qty 62

## 2022-07-21 MED ORDER — SODIUM CHLORIDE 0.9 % IV SOLN
10.0000 mg | Freq: Once | INTRAVENOUS | Status: AC
  Administered 2022-07-21: 10 mg via INTRAVENOUS
  Filled 2022-07-21: qty 10

## 2022-07-21 MED ORDER — ACETAMINOPHEN 325 MG PO TABS
650.0000 mg | ORAL_TABLET | Freq: Once | ORAL | Status: AC
  Administered 2022-07-21: 650 mg via ORAL
  Filled 2022-07-21: qty 2

## 2022-07-21 MED ORDER — OXALIPLATIN CHEMO INJECTION 100 MG/20ML
65.0000 mg/m2 | Freq: Once | INTRAVENOUS | Status: AC
  Administered 2022-07-21: 115 mg via INTRAVENOUS
  Filled 2022-07-21: qty 20

## 2022-07-21 MED ORDER — POTASSIUM CHLORIDE ER 10 MEQ PO CPCR
10.0000 meq | ORAL_CAPSULE | Freq: Two times a day (BID) | ORAL | 2 refills | Status: DC
  Filled 2022-07-21: qty 60, 30d supply, fill #0

## 2022-07-21 MED ORDER — LEUCOVORIN CALCIUM INJECTION 350 MG
400.0000 mg/m2 | Freq: Once | INTRAVENOUS | Status: AC
  Administered 2022-07-21: 704 mg via INTRAVENOUS
  Filled 2022-07-21: qty 35.2

## 2022-07-21 MED ORDER — SODIUM CHLORIDE 0.9 % IV SOLN
150.0000 mg | Freq: Once | INTRAVENOUS | Status: AC
  Administered 2022-07-21: 150 mg via INTRAVENOUS
  Filled 2022-07-21: qty 150

## 2022-07-21 MED ORDER — PALONOSETRON HCL INJECTION 0.25 MG/5ML
0.2500 mg | Freq: Once | INTRAVENOUS | Status: AC
  Administered 2022-07-21: 0.25 mg via INTRAVENOUS
  Filled 2022-07-21: qty 5

## 2022-07-21 NOTE — Progress Notes (Signed)
[facility] OFFICE PROGRESS NOTE   Diagnosis: Colon cancer  INTERVAL HISTORY:   April [name] returns as scheduled.  She completed another cycle of FOLFOX on 06/30/2022.  She had nausea following chemotherapy, but no vomiting.  No mouth sores or diarrhea.  She reports prolonged cold sensitivity following the last cycle of chemotherapy.  No neuropathy symptoms at present.  She has intermittent "cramping "abdominal pain.  Objective:  Vital signs in last 24 hours:  Blood pressure (!) 142/80, pulse 87, temperature 98.2 F (36.8 C), temperature source Oral, resp. rate 20, height 5\' 4"  (1.626 m), weight 152 lb (68.9 kg), last menstrual period 01/07/2006, SpO2 100 %.    HEENT: White coat over the tongue, no buccal thrush or ulcers Resp: Lungs clear bilaterally Cardio: Regular rate and rhythm GI: No hepatosplenomegaly, nontender, no apparent ascites Vascular: No leg edema Neuro: Very mild loss of vibratory sense at the fingertips bilaterally    Portacath/PICC-without erythema  Lab Results:  Lab Results  Component Value Date   WBC 6.3 07/21/2022   HGB 11.5 (L) 07/21/2022   HCT 35.2 (L) 07/21/2022   MCV 94.9 07/21/2022   PLT 214 07/21/2022   NEUTROABS 2.7 07/21/2022    CMP  Lab Results  Component Value Date   NA 140 07/21/2022   K 3.9 07/21/2022   CL 107 07/21/2022   CO2 28 07/21/2022   GLUCOSE 93 07/21/2022   BUN 11 07/21/2022   CREATININE 0.52 07/21/2022   CALCIUM 9.2 07/21/2022   PROT 7.3 07/21/2022   ALBUMIN 4.1 07/21/2022   AST 14 (L) 07/21/2022   ALT 10 07/21/2022   ALKPHOS 131 (H) 07/21/2022   BILITOT 0.2 (L) 07/21/2022   GFRNONAA >60 07/21/2022   GFRAA >60 10/17/2019    Lab Results  Component Value Date   CEA1 <0.6 03/23/2022   CEA 2.99 06/30/2022   [id number] 213 (H) 03/23/2022    Medications: I have reviewed the patient's current medications.   Assessment/Plan: Sigmoid colon cancer, stage IV (pT4a,pN2b,M1c) Colonoscopy 03/24/2019-3  rectal polyps-hyperplastic polyps, distal colon biopsy-at least intramucosal adenocarcinoma, completely obstructing mass in the distal sigmoid colon, could not be traversed, intact mismatch repair protein expression 03/24/2019-CEA 56.1 03/29/2019 CT abdomen/pelvis-circumferential thickening involving the entire mid and distal sigmoid colon to the level of the rectosigmoid junction, solid/cystic mass in the left ovary, bilateral nephrolithiasis Robotic assisted low anterior resection, mesenteric lymphadenectomy, bilateral salpingo-oophorectomy 04/08/2019 Pathology ([facility] review of outside pathology) metastatic adenocarcinoma involving the peritoneum overlying the round ligament, serosa of the urinary bladder, serosa of the right ureter a sacral area, pelvic peritoneum, and left ovary.  Omental biopsy with focal mucin pools with no carcinoma cells identified, right hemidiaphragm biopsy involved by metastatic adenocarcinoma, invasive adenocarcinoma the sigmoid colon, moderately differentiated, T4a, perineural and vascular invasion present, 7/22 lymph nodes, multiple tumor deposits, resection margins negative Negative for PD-L1, low probability of MSI-high, HER-2 negative, negative for BRAF, NRAS and KRAS alterations CTs 05/19/2019-no evidence of metastatic disease, findings suspicious for colitis of the transverse and ascending colon, small amount of ascites in the cul-de-sac, bilateral renal calculi Cycle 1 FOLFIRI 05/24/2019, bevacizumab added with cycle 2 Cycle 4 FOLFIRI/bevacizumab 07/05/2019 Cycle 5 FOLFIRI 07/27/2019, bevacizumab held secondary to hypertension Cycle 6 FOLFIRI 08/10/1999, bevacizumab held secondary to hypertension CTs 08/30/2019-no evidence of recurrent disease, new subsolid right lower lobe nodule felt to be inflammatory, emphysema Cycle 7 FOLFIRI 09/26/2019, bevacizumab remains on hold secondary to hypertension Cycle 8 FOLFIRI 10/17/2019, bevacizumab held secondary to hypertension, Udenyca added  for neutropenia Cycle 9 FOLFIRI 11/07/2019, bevacizumab held secondary to hypertension, Udenyca Cycle 10 FOLFIRI 11/28/2019, bevacizumab held, Udenyca Cycle 11 FOLFIRI 12/26/2019, bevacizumab held, Udenyca CTs 01/25/2020-no evidence of recurrent disease, resolution of right lower lobe nodule Maintenance Xeloda beginning 02/06/2020 CTs 04/25/2020- no evidence of metastatic disease, multiple bilateral renal calculi without hydronephrosis Maintenance Xeloda continued CT abdomen/pelvis without contrast 08/10/2020-bilateral staghorn renal calculi, no evidence of metastatic disease; addendum 08/23/2020-small but increasing omental nodules. Cycle 1 FOLFIRI/panitumumab 09/19/2020 Cycle 2 FOLFIRI/Panitumumab 10/03/2020 Cycle 3 FOLFIRI/Panitumumab 10/17/2020 Cycle 4 FOLFIRI/panitumumab 10/31/2020 Cycle 5 FOLFIRI/Panitumumab 11/14/2020 CTs 11/26/2020-stable omental metastases compared to 10/22/2020, mildly decreased from 08/10/2020.  Left lower quadrant tiny paracolic gutter implant slightly decreased from CT 08/10/2020.  No new or progressive metastatic disease in the abdomen or pelvis. Cycle 6 FOLFIRI/Panitumumab 11/28/2020, irinotecan dose reduced, treatment schedule adjusted to every 3 weeks going forward Cycle 7 FOLFIRI/panitumumab 12/24/2020 Cycle 8 FOLFIRI/Panitumumab 01/16/2021--treatment held due to hypertension in the infusion area. Cycle 8 FOLFIRI/panitumumab 01/21/2021 Cycle 9 FOLFIRI/panitumumab 02/11/2021 Cycle 10 FOLFIRI/Panitumumab 03/04/2021 Cycle 11 FOLFIRI/Panitumumab 03/25/2021 CT abdomen/pelvis 04/10/2021-stable right omental and left posterior paracolic gutter nodules Cycle 12 FOLFIRI/panitumumab 04/15/2021 Cycle 13 FOLFIRI/panitumumab 05/06/2021 Cycle 14 FOLFIRI/Panitumumab 05/27/2021 Cycle 15 FOLFIRI/Panitumumab 06/17/2021 CT abdomen/pelvis 07/05/2021-slight enlargement of a dominant right omental implant, no new implants Patient requested a treatment break CT abdomen/pelvis 10/04/2021-mild increase in  size of multiple omental soft tissue nodules, 4 mm calculus in the distal left ureter adjacent to the ureteral stent Cycle 1 Lonsurf 10/14/2021 10/28/2021 Avastin every 2 weeks Cycle 2 Lonsurf 11/11/2021 Cycle 3 Lonsurf 12/09/2021 Cycle 4 Lonsurf 01/06/2022 Avastin held 01/20/2022 due to proteinuria, 24-hour urine 43 mg CT/pelvis 01/30/2022-possible new peritoneal implant at the transverse colon, no significant change in other omental/peritoneal implants, stable chronic rectal wall thickening, status post removal of double-J left ureteral stent with no evidence of hydronephrosis or ureteral calculus, nonobstructing bilateral renal calculi Cycle 5 Lonsurf 02/05/2022 or 02/06/2022 Avastin every 2 weeks Cycle 6 Lonsurf 03/03/2022 CTs 04/05/2022-increase in size and number of peritoneal implants compared to January 2024 central liver mass narrowing the anterior branch of the right portal vein, decreased biliary duct dilation, biliary stents in place, stable right cardiophrenic lymph nodes, separate pigtail stent in the lumen of the proximal duodenum Cycle 1 FOLFOX 04/14/2022 CT abdomen/pelvis 04/16/2022-stent within the third portion of the duodenum, stable peritoneal nodules, indwelling biliary stents with mild left biliary duct dilation Cycle 2 FOLFOX 04/28/2022, Emend and prophylactic dexamethasone added Cycle 3 FOLFOX 05/12/2022, Aloxi, Emend, prophylactic dexamethasone, Compazine and lorazepam as needed Chemotherapy held 05/25/2020 due to severe hypertension, evaluated in the emergency department Cycle 4 FOLFOX 06/03/2022 Cycle 5 FOLFOX 06/16/2022, oxaliplatin dose reduced and 5-FU bolus eliminated due to neutropenia CT 06/25/2022-unchanged soft tissue at the hepatic hilum, unchanged, bile duct stent, decreased size of peritoneal and omental nodules and diminished volume of likely fluid in the lower left pelvis Cycle 6 FOLFOX 06/30/2022 Cycle 7 FOLFOX 07/21/2022   Hypertension G4 P3, twins Kidney  stones-bilateral staghorn renal calculi on CT 08/10/2020 Infected Port-A-Cath 09/12/2019-placed on Augmentin, referred for Port-A-Cath removal; Port-A-Cath removed 09/14/2019; PICC line placed 09/14/2019; culture staph aureus.  Course of Septra completed. Neutropenia secondary to chemotherapy-Udenyca added with cycle 8 FOLFIRI Right nephrostomy tube 09/12/2020; stent placed 10/09/2020, percutaneous nephrostolithotomy treatment of right-sided kidney stones 10/09/2020, malpositioned right ureter stent replacement 10/23/2020, right ureter stent removed 10/29/2020 Percutaneous left nephrostolithotomy 10/01/2021-no renal stone identified, left ureter stent left in place Cystoscopy 10/14/2021-stent removed 8.  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  Admission 04/16/2022 with nausea/vomiting and abdominal pain, felt to be acute toxicity related to chemotherapy, discharged home 04/18/2022      Disposition: Ms. Makin appears stable.  She continues to tolerate the chemotherapy well.  She will complete another cycle of FOLFOX today.  She has minimal symptoms of oxaliplatin neuropathy at present.  We will monitor for progression of neuropathy symptoms.  She will return for an office visit and chemotherapy in 2 weeks.  We will plan for a restaging CT evaluation after cycle 9 FOLFOX.  Thornton Papas, MD  07/21/2022  9:57 AM

## 2022-07-21 NOTE — Progress Notes (Signed)
Patient seen by Dr. Truett Perna today  Vitals are within treatment parameters.No intervention for BP 142/80  Labs reviewed by Dr. Truett Perna and are within treatment parameters.  Per physician team, patient is ready for treatment and there are NO modifications to the treatment plan.

## 2022-07-21 NOTE — Patient Instructions (Addendum)
[facility]   The chemotherapy medication bag should finish at 46 hours, 96 hours, or 7 days. For example, if your pump is scheduled for 46 hours and it was put on at 4:00 p.m., it should finish at 2:00 p.m. the day it is scheduled to come off regardless of your appointment time.     Estimated time to finish at 11:30 Wednesday July 10th , 2024.   If the display on your pump reads "Low Volume" and it is beeping, take the batteries out of the pump and come to the cancer center for it to be taken off.   If the pump alarms go off prior to the pump reading "Low Volume" then call [phone] and someone can assist you.  If the plunger comes out and the chemotherapy medication is leaking out, please use your home chemo spill kit to clean up the spill. Do NOT use paper towels or other household products.  If you have problems or questions regarding your pump, please call either [phone] (24 hours a day) or the cancer center Monday-Friday 8:00 a.m.- 4:30 p.m. at the clinic number and we will assist you. If you are unable to get assistance, then go to the nearest Emergency Department and ask the staff to contact the IV team for assistance.  Discharge Instructions: Thank you for choosing [facility] to provide your oncology and hematology care.   If you have a lab appointment with the Cancer Center, please go directly to the Cancer Center and check in at the registration area.   Wear comfortable clothing and clothing appropriate for easy access to any Portacath or PICC line.   We strive to give you quality time with your provider. You may need to reschedule your appointment if you arrive late (15 or more minutes).  Arriving late affects you and other patients whose appointments are after yours.  Also, if you miss three or more appointments without notifying the office, you may be dismissed from the clinic at the provider's discretion.      For  prescription refill requests, have your pharmacy contact our office and allow 72 hours for refills to be completed.    Today you received the following chemotherapy and/or immunotherapy agents Oxaliplatin, Leucovorin, Fluorouracil.      To help prevent nausea and vomiting after your treatment, we encourage you to take your nausea medication as directed.  BELOW ARE SYMPTOMS THAT SHOULD BE REPORTED IMMEDIATELY: *FEVER GREATER THAN 100.4 F (38 C) OR HIGHER *CHILLS OR SWEATING *NAUSEA AND VOMITING THAT IS NOT CONTROLLED WITH YOUR NAUSEA MEDICATION *UNUSUAL SHORTNESS OF BREATH *UNUSUAL BRUISING OR BLEEDING *URINARY PROBLEMS (pain or burning when urinating, or frequent urination) *BOWEL PROBLEMS (unusual diarrhea, constipation, pain near the anus) TENDERNESS IN MOUTH AND THROAT WITH OR WITHOUT PRESENCE OF ULCERS (sore throat, sores in mouth, or a toothache) UNUSUAL RASH, SWELLING OR PAIN  UNUSUAL VAGINAL DISCHARGE OR ITCHING   Items with * indicate a potential emergency and should be followed up as soon as possible or go to the Emergency Department if any problems should occur.  Please show the CHEMOTHERAPY ALERT CARD or IMMUNOTHERAPY ALERT CARD at check-in to the Emergency Department and triage nurse.  Should you have questions after your visit or need to cancel or reschedule your appointment, please contact [facility]  Dept: [phone]  and follow the prompts.  Office hours are 8:00 a.m. to 4:30 p.m. Monday - Friday. Please  note that voicemails left after 4:00 p.m. may not be returned until the following business day.  We are closed weekends and major holidays. You have access to a nurse at all times for urgent questions. Please call the main number to the clinic Dept: [phone] and follow the prompts.   For any non-urgent questions, you may also contact your provider using MyChart. We now offer e-Visits for anyone 4 and older to request care online  for non-urgent symptoms. For details visit [email or web address].   Also download the MyChart app! Go to the app store, search "MyChart", open the app, select [facility], and log in with your MyChart username and password.  Oxaliplatin Injection What is this medication? OXALIPLATIN (ox AL i PLA tin) treats colorectal cancer. It works by slowing down the growth of cancer cells. This medicine may be used for other purposes; ask your health care provider or pharmacist if you have questions. COMMON BRAND NAME(S): Eloxatin What should I tell my care team before I take this medication? They need to know if you have any of these conditions: Heart disease History of irregular heartbeat or rhythm Liver disease Low blood cell levels (white cells, red cells, and platelets) Lung or breathing disease, such as asthma Take medications that treat or prevent blood clots Tingling of the fingers, toes, or other nerve disorder An unusual or allergic reaction to oxaliplatin, other medications, foods, dyes, or preservatives If you or your partner are pregnant or trying to get pregnant Breast-feeding How should I use this medication? This medication is injected into a vein. It is given by your care team in a hospital or clinic setting. Talk to your care team about the use of this medication in children. Special care may be needed. Overdosage: If you think you have taken too much of this medicine contact a poison control center or emergency room at once. NOTE: This medicine is only for you. Do not share this medicine with others. What if I miss a dose? Keep appointments for follow-up doses. It is important not to miss a dose. Call your care team if you are unable to keep an appointment. What may interact with this medication? Do not take this medication with any of the following: Cisapride Dronedarone Pimozide Thioridazine This medication may also interact with the following: Aspirin and aspirin-like  medications Certain medications that treat or prevent blood clots, such as warfarin, apixaban, dabigatran, and rivaroxaban Cisplatin Cyclosporine Diuretics Medications for infection, such as acyclovir, adefovir, amphotericin B, bacitracin, cidofovir, foscarnet, ganciclovir, gentamicin, pentamidine, vancomycin NSAIDs, medications for pain and inflammation, such as ibuprofen or naproxen Other medications that cause heart rhythm changes Pamidronate Zoledronic acid This list may not describe all possible interactions. Give your health care provider a list of all the medicines, herbs, non-prescription drugs, or dietary supplements you use. Also tell them if you smoke, drink alcohol, or use illegal drugs. Some items may interact with your medicine. What should I watch for while using this medication? Your condition will be monitored carefully while you are receiving this medication. You may need blood work while taking this medication. This medication may make you feel generally unwell. This is not uncommon as chemotherapy can affect healthy cells as well as cancer cells. Report any side effects. Continue your course of treatment even though you feel ill unless your care team tells you to stop. This medication may increase your risk of getting an infection. Call your care team for advice if you get a fever, chills, sore throat,  or other symptoms of a cold or flu. Do not treat yourself. Try to avoid being around people who are sick. Avoid taking medications that contain aspirin, acetaminophen, ibuprofen, naproxen, or ketoprofen unless instructed by your care team. These medications may hide a fever. Be careful brushing or flossing your teeth or using a toothpick because you may get an infection or bleed more easily. If you have any dental work done, tell your dentist you are receiving this medication. This medication can make you more sensitive to cold. Do not drink cold drinks or use ice. Cover exposed  skin before coming in contact with cold temperatures or cold objects. When out in cold weather wear warm clothing and cover your mouth and nose to warm the air that goes into your lungs. Tell your care team if you get sensitive to the cold. Talk to your care team if you or your partner are pregnant or think either of you might be pregnant. This medication can cause serious birth defects if taken during pregnancy and for 9 months after the last dose. A negative pregnancy test is required before starting this medication. A reliable form of contraception is recommended while taking this medication and for 9 months after the last dose. Talk to your care team about effective forms of contraception. Do not father a child while taking this medication and for 6 months after the last dose. Use a condom while having sex during this time period. Do not breastfeed while taking this medication and for 3 months after the last dose. This medication may cause infertility. Talk to your care team if you are concerned about your fertility. What side effects may I notice from receiving this medication? Side effects that you should report to your care team as soon as possible: Allergic reactions--skin rash, itching, hives, swelling of the face, lips, tongue, or throat Bleeding--bloody or black, tar-like stools, vomiting blood or brown material that looks like coffee grounds, red or dark brown urine, small red or purple spots on skin, unusual bruising or bleeding Dry cough, shortness of breath or trouble breathing Heart rhythm changes--fast or irregular heartbeat, dizziness, feeling faint or lightheaded, chest pain, trouble breathing Infection--fever, chills, cough, sore throat, wounds that don't heal, pain or trouble when passing urine, general feeling of discomfort or being unwell Liver injury--right upper belly pain, loss of appetite, nausea, light-colored stool, dark yellow or brown urine, yellowing skin or eyes, unusual  weakness or fatigue Low red blood cell level--unusual weakness or fatigue, dizziness, headache, trouble breathing Muscle injury--unusual weakness or fatigue, muscle pain, dark yellow or brown urine, decrease in amount of urine Pain, tingling, or numbness in the hands or feet Sudden and severe headache, confusion, change in vision, seizures, which may be signs of posterior reversible encephalopathy syndrome (PRES) Unusual bruising or bleeding Side effects that usually do not require medical attention (report to your care team if they continue or are bothersome): Diarrhea Nausea Pain, redness, or swelling with sores inside the mouth or throat Unusual weakness or fatigue Vomiting This list may not describe all possible side effects. Call your doctor for medical advice about side effects. You may report side effects to FDA at 1-800-FDA-1088. Where should I keep my medication? This medication is given in a hospital or clinic. It will not be stored at home. NOTE: This sheet is a summary. It may not cover all possible information. If you have questions about this medicine, talk to your doctor, pharmacist, or health care provider.  2024 Elsevier/Gold  Standard (2022-03-09 00:00:00) Leucovorin Injection What is this medication? LEUCOVORIN (loo koe VOR in) prevents side effects from certain medications, such as methotrexate. It works by increasing folate levels. This helps protect healthy cells in your body. It may also be used to treat anemia caused by low levels of folate. It can also be used with fluorouracil, a type of chemotherapy, to treat colorectal cancer. It works by increasing the effects of fluorouracil in the body. This medicine may be used for other purposes; ask your health care provider or pharmacist if you have questions. What should I tell my care team before I take this medication? They need to know if you have any of these conditions: Anemia from low levels of vitamin B12 in the  blood An unusual or allergic reaction to leucovorin, folic acid, other medications, foods, dyes, or preservatives Pregnant or trying to get pregnant Breastfeeding How should I use this medication? This medication is injected into a vein or a muscle. It is given by your care team in a hospital or clinic setting. Talk to your care team about the use of this medication in children. Special care may be needed. Overdosage: If you think you have taken too much of this medicine contact a poison control center or emergency room at once. NOTE: This medicine is only for you. Do not share this medicine with others. What if I miss a dose? Keep appointments for follow-up doses. It is important not to miss your dose. Call your care team if you are unable to keep an appointment. What may interact with this medication? Capecitabine Fluorouracil Phenobarbital Phenytoin Primidone Trimethoprim;sulfamethoxazole This list may not describe all possible interactions. Give your health care provider a list of all the medicines, herbs, non-prescription drugs, or dietary supplements you use. Also tell them if you smoke, drink alcohol, or use illegal drugs. Some items may interact with your medicine. What should I watch for while using this medication? Your condition will be monitored carefully while you are receiving this medication. This medication may increase the side effects of 5-fluorouracil. Tell your care team if you have diarrhea or mouth sores that do not get better or that get worse. What side effects may I notice from receiving this medication? Side effects that you should report to your care team as soon as possible: Allergic reactions--skin rash, itching, hives, swelling of the face, lips, tongue, or throat This list may not describe all possible side effects. Call your doctor for medical advice about side effects. You may report side effects to FDA at 1-800-FDA-1088. Where should I keep my  medication? This medication is given in a hospital or clinic. It will not be stored at home. NOTE: This sheet is a summary. It may not cover all possible information. If you have questions about this medicine, talk to your doctor, pharmacist, or health care provider.  2024 Elsevier/Gold Standard (2021-06-04 00:00:00) Fluorouracil Injection What is this medication? FLUOROURACIL (flure oh YOOR a sil) treats some types of cancer. It works by slowing down the growth of cancer cells. This medicine may be used for other purposes; ask your health care provider or pharmacist if you have questions. COMMON BRAND NAME(S): Adrucil What should I tell my care team before I take this medication? They need to know if you have any of these conditions: Blood disorders Dihydropyrimidine dehydrogenase (DPD) deficiency Infection, such as chickenpox, cold sores, herpes Kidney disease Liver disease Poor nutrition Recent or ongoing radiation therapy An unusual or allergic reaction to fluorouracil,  other medications, foods, dyes, or preservatives If you or your partner are pregnant or trying to get pregnant Breast-feeding How should I use this medication? This medication is injected into a vein. It is administered by your care team in a hospital or clinic setting. Talk to your care team about the use of this medication in children. Special care may be needed. Overdosage: If you think you have taken too much of this medicine contact a poison control center or emergency room at once. NOTE: This medicine is only for you. Do not share this medicine with others. What if I miss a dose? Keep appointments for follow-up doses. It is important not to miss your dose. Call your care team if you are unable to keep an appointment. What may interact with this medication? Do not take this medication with any of the following: Live virus vaccines This medication may also interact with the following: Medications that treat or  prevent blood clots, such as warfarin, enoxaparin, dalteparin This list may not describe all possible interactions. Give your health care provider a list of all the medicines, herbs, non-prescription drugs, or dietary supplements you use. Also tell them if you smoke, drink alcohol, or use illegal drugs. Some items may interact with your medicine. What should I watch for while using this medication? Your condition will be monitored carefully while you are receiving this medication. This medication may make you feel generally unwell. This is not uncommon as chemotherapy can affect healthy cells as well as cancer cells. Report any side effects. Continue your course of treatment even though you feel ill unless your care team tells you to stop. In some cases, you may be given additional medications to help with side effects. Follow all directions for their use. This medication may increase your risk of getting an infection. Call your care team for advice if you get a fever, chills, sore throat, or other symptoms of a cold or flu. Do not treat yourself. Try to avoid being around people who are sick. This medication may increase your risk to bruise or bleed. Call your care team if you notice any unusual bleeding. Be careful brushing or flossing your teeth or using a toothpick because you may get an infection or bleed more easily. If you have any dental work done, tell your dentist you are receiving this medication. Avoid taking medications that contain aspirin, acetaminophen, ibuprofen, naproxen, or ketoprofen unless instructed by your care team. These medications may hide a fever. Do not treat diarrhea with over the counter products. Contact your care team if you have diarrhea that lasts more than 2 days or if it is severe and watery. This medication can make you more sensitive to the sun. Keep out of the sun. If you cannot avoid being in the sun, wear protective clothing and sunscreen. Do not use sun lamps,  tanning beds, or tanning booths. Talk to your care team if you or your partner wish to become pregnant or think you might be pregnant. This medication can cause serious birth defects if taken during pregnancy and for 3 months after the last dose. A reliable form of contraception is recommended while taking this medication and for 3 months after the last dose. Talk to your care team about effective forms of contraception. Do not father a child while taking this medication and for 3 months after the last dose. Use a condom while having sex during this time period. Do not breastfeed while taking this medication. This medication may  cause infertility. Talk to your care team if you are concerned about your fertility. What side effects may I notice from receiving this medication? Side effects that you should report to your care team as soon as possible: Allergic reactions--skin rash, itching, hives, swelling of the face, lips, tongue, or throat Heart attack--pain or tightness in the chest, shoulders, arms, or jaw, nausea, shortness of breath, cold or clammy skin, feeling faint or lightheaded Heart failure--shortness of breath, swelling of the ankles, feet, or hands, sudden weight gain, unusual weakness or fatigue Heart rhythm changes--fast or irregular heartbeat, dizziness, feeling faint or lightheaded, chest pain, trouble breathing High ammonia level--unusual weakness or fatigue, confusion, loss of appetite, nausea, vomiting, seizures Infection--fever, chills, cough, sore throat, wounds that don't heal, pain or trouble when passing urine, general feeling of discomfort or being unwell Low red blood cell level--unusual weakness or fatigue, dizziness, headache, trouble breathing Pain, tingling, or numbness in the hands or feet, muscle weakness, change in vision, confusion or trouble speaking, loss of balance or coordination, trouble walking, seizures Redness, swelling, and blistering of the skin over hands and  feet Severe or prolonged diarrhea Unusual bruising or bleeding Side effects that usually do not require medical attention (report to your care team if they continue or are bothersome): Dry skin Headache Increased tears Nausea Pain, redness, or swelling with sores inside the mouth or throat Sensitivity to light Vomiting This list may not describe all possible side effects. Call your doctor for medical advice about side effects. You may report side effects to FDA at 1-800-FDA-1088. Where should I keep my medication? This medication is given in a hospital or clinic. It will not be stored at home. NOTE: This sheet is a summary. It may not cover all possible information. If you have questions about this medicine, talk to your doctor, pharmacist, or health care provider.  2024 Elsevier/Gold Standard (2021-05-07 00:00:00)

## 2022-07-21 NOTE — Addendum Note (Signed)
Addended by: Wandalee Ferdinand on: 07/21/2022 12:53 PM   Modules accepted: Orders

## 2022-07-23 ENCOUNTER — Inpatient Hospital Stay: Payer: 59

## 2022-07-23 VITALS — BP 132/84 | HR 92 | Temp 97.7°F | Resp 20

## 2022-07-23 DIAGNOSIS — C19 Malignant neoplasm of rectosigmoid junction: Secondary | ICD-10-CM

## 2022-07-23 DIAGNOSIS — R112 Nausea with vomiting, unspecified: Secondary | ICD-10-CM | POA: Diagnosis not present

## 2022-07-23 MED ORDER — PEGFILGRASTIM-CBQV 6 MG/0.6ML ~~LOC~~ SOSY
6.0000 mg | PREFILLED_SYRINGE | Freq: Once | SUBCUTANEOUS | Status: AC
  Administered 2022-07-23: 6 mg via SUBCUTANEOUS
  Filled 2022-07-23: qty 0.6

## 2022-07-23 MED ORDER — SODIUM CHLORIDE 0.9% FLUSH
10.0000 mL | INTRAVENOUS | Status: DC | PRN
  Administered 2022-07-23: 10 mL

## 2022-07-23 MED ORDER — HEPARIN SOD (PORK) LOCK FLUSH 100 UNIT/ML IV SOLN
500.0000 [IU] | Freq: Once | INTRAVENOUS | Status: AC | PRN
  Administered 2022-07-23: 500 [IU]

## 2022-07-24 ENCOUNTER — Inpatient Hospital Stay (HOSPITAL_COMMUNITY)
Admission: EM | Admit: 2022-07-24 | Discharge: 2022-07-26 | DRG: 391 | Disposition: A | Discharge status: Home / Self Care | Payer: 59 | Attending: Internal Medicine | Admitting: Internal Medicine

## 2022-07-24 ENCOUNTER — Emergency Department (HOSPITAL_COMMUNITY): Payer: 59

## 2022-07-24 ENCOUNTER — Encounter (HOSPITAL_COMMUNITY): Payer: Self-pay

## 2022-07-24 ENCOUNTER — Telehealth: Payer: Self-pay

## 2022-07-24 ENCOUNTER — Other Ambulatory Visit: Payer: Self-pay

## 2022-07-24 DIAGNOSIS — R112 Nausea with vomiting, unspecified: Principal | ICD-10-CM

## 2022-07-24 DIAGNOSIS — D72829 Elevated white blood cell count, unspecified: Secondary | ICD-10-CM

## 2022-07-24 DIAGNOSIS — Z87442 Personal history of urinary calculi: Secondary | ICD-10-CM

## 2022-07-24 DIAGNOSIS — C189 Malignant neoplasm of colon, unspecified: Secondary | ICD-10-CM

## 2022-07-24 DIAGNOSIS — Z87891 Personal history of nicotine dependence: Secondary | ICD-10-CM

## 2022-07-24 DIAGNOSIS — G43909 Migraine, unspecified, not intractable, without status migrainosus: Secondary | ICD-10-CM | POA: Diagnosis present

## 2022-07-24 DIAGNOSIS — R9431 Abnormal electrocardiogram [ECG] [EKG]: Secondary | ICD-10-CM | POA: Diagnosis present

## 2022-07-24 DIAGNOSIS — E876 Hypokalemia: Secondary | ICD-10-CM | POA: Diagnosis not present

## 2022-07-24 DIAGNOSIS — C19 Malignant neoplasm of rectosigmoid junction: Secondary | ICD-10-CM | POA: Diagnosis present

## 2022-07-24 DIAGNOSIS — I10 Essential (primary) hypertension: Secondary | ICD-10-CM | POA: Diagnosis present

## 2022-07-24 DIAGNOSIS — Z6826 Body mass index (BMI) 26.0-26.9, adult: Secondary | ICD-10-CM

## 2022-07-24 DIAGNOSIS — Z833 Family history of diabetes mellitus: Secondary | ICD-10-CM

## 2022-07-24 DIAGNOSIS — Z8249 Family history of ischemic heart disease and other diseases of the circulatory system: Secondary | ICD-10-CM

## 2022-07-24 DIAGNOSIS — T451X5A Adverse effect of antineoplastic and immunosuppressive drugs, initial encounter: Secondary | ICD-10-CM | POA: Diagnosis present

## 2022-07-24 DIAGNOSIS — Z79899 Other long term (current) drug therapy: Secondary | ICD-10-CM

## 2022-07-24 DIAGNOSIS — Z823 Family history of stroke: Secondary | ICD-10-CM

## 2022-07-24 DIAGNOSIS — E43 Unspecified severe protein-calorie malnutrition: Secondary | ICD-10-CM | POA: Diagnosis present

## 2022-07-24 DIAGNOSIS — C786 Secondary malignant neoplasm of retroperitoneum and peritoneum: Secondary | ICD-10-CM | POA: Diagnosis present

## 2022-07-24 DIAGNOSIS — Z888 Allergy status to other drugs, medicaments and biological substances status: Secondary | ICD-10-CM

## 2022-07-24 LAB — URINALYSIS, ROUTINE W REFLEX MICROSCOPIC
Bilirubin Urine: NEGATIVE
Glucose, UA: NEGATIVE mg/dL
Hgb urine dipstick: NEGATIVE
Ketones, ur: 20 mg/dL — AB
Leukocytes,Ua: NEGATIVE
Nitrite: NEGATIVE
Protein, ur: 100 mg/dL — AB
Specific Gravity, Urine: 1.026 (ref 1.005–1.030)
pH: 5 (ref 5.0–8.0)

## 2022-07-24 LAB — CBC WITH DIFFERENTIAL/PLATELET
Abs Immature Granulocytes: 4.63 10*3/uL — ABNORMAL HIGH (ref 0.00–0.07)
Basophils Absolute: 0.1 10*3/uL (ref 0.0–0.1)
Basophils Relative: 0 %
Eosinophils Absolute: 0.1 10*3/uL (ref 0.0–0.5)
Eosinophils Relative: 0 %
HCT: 36.9 % (ref 36.0–46.0)
Hemoglobin: 12.1 g/dL (ref 12.0–15.0)
Immature Granulocytes: 6 %
Lymphocytes Relative: 2 %
Lymphs Abs: 1.8 10*3/uL (ref 0.7–4.0)
MCH: 30.9 pg (ref 26.0–34.0)
MCHC: 32.8 g/dL (ref 30.0–36.0)
MCV: 94.4 fL (ref 80.0–100.0)
Monocytes Absolute: 1.3 10*3/uL — ABNORMAL HIGH (ref 0.1–1.0)
Monocytes Relative: 2 %
Neutro Abs: 71.4 10*3/uL — ABNORMAL HIGH (ref 1.7–7.7)
Neutrophils Relative %: 90 %
Platelets: 222 10*3/uL (ref 150–400)
RBC: 3.91 MIL/uL (ref 3.87–5.11)
RDW: 17.8 % — ABNORMAL HIGH (ref 11.5–15.5)
WBC: 79.3 10*3/uL (ref 4.0–10.5)
nRBC: 0 % (ref 0.0–0.2)

## 2022-07-24 LAB — COMPREHENSIVE METABOLIC PANEL
ALT: 20 U/L (ref 0–44)
AST: 24 U/L (ref 15–41)
Albumin: 4 g/dL (ref 3.5–5.0)
Alkaline Phosphatase: 145 U/L — ABNORMAL HIGH (ref 38–126)
Anion gap: 9 (ref 5–15)
BUN: 12 mg/dL (ref 6–20)
CO2: 25 mmol/L (ref 22–32)
Calcium: 8.7 mg/dL — ABNORMAL LOW (ref 8.9–10.3)
Chloride: 99 mmol/L (ref 98–111)
Creatinine, Ser: 0.48 mg/dL (ref 0.44–1.00)
GFR, Estimated: >60 mL/min (ref >60)
Glucose, Bld: 91 mg/dL (ref 70–99)
Potassium: 2.8 mmol/L — ABNORMAL LOW (ref 3.5–5.1)
Sodium: 133 mmol/L — ABNORMAL LOW (ref 135–145)
Total Bilirubin: 0.6 mg/dL (ref 0.3–1.2)
Total Protein: 8.2 g/dL — ABNORMAL HIGH (ref 6.5–8.1)

## 2022-07-24 LAB — LIPASE, BLOOD: Lipase: 186 U/L — ABNORMAL HIGH (ref 11–51)

## 2022-07-24 LAB — MAGNESIUM: Magnesium: 2.1 mg/dL (ref 1.7–2.4)

## 2022-07-24 LAB — LACTIC ACID, PLASMA: Lactic Acid, Venous: 1.3 mmol/L (ref 0.5–1.9)

## 2022-07-24 MED ORDER — IOHEXOL 300 MG/ML  SOLN
100.0000 mL | Freq: Once | INTRAMUSCULAR | Status: AC | PRN
  Administered 2022-07-24: 100 mL via INTRAVENOUS

## 2022-07-24 MED ORDER — LACTULOSE 10 GM/15ML PO SOLN
20.0000 g | Freq: Two times a day (BID) | ORAL | Status: DC
  Administered 2022-07-25: 20 g via ORAL
  Filled 2022-07-24 (×3): qty 30

## 2022-07-24 MED ORDER — SORBITOL 70 % SOLN
200.0000 mL | TOPICAL_OIL | Freq: Once | ORAL | Status: AC
  Administered 2022-07-25: 200 mL via RECTAL
  Filled 2022-07-24: qty 60

## 2022-07-24 MED ORDER — SENNOSIDES-DOCUSATE SODIUM 8.6-50 MG PO TABS: 1.0000 | ORAL_TABLET | Freq: Every evening | ORAL | Status: DC | PRN

## 2022-07-24 MED ORDER — HYDROMORPHONE HCL 1 MG/ML IJ SOLN
1.0000 mg | Freq: Once | INTRAMUSCULAR | Status: AC
  Administered 2022-07-24: 1 mg via INTRAVENOUS
  Filled 2022-07-24: qty 1

## 2022-07-24 MED ORDER — POTASSIUM CHLORIDE 10 MEQ/100ML IV SOLN
10.0000 meq | INTRAVENOUS | Status: AC
  Administered 2022-07-24: 10 meq via INTRAVENOUS
  Filled 2022-07-24: qty 100

## 2022-07-24 MED ORDER — ONDANSETRON HCL 4 MG/2ML IJ SOLN
4.0000 mg | Freq: Once | INTRAMUSCULAR | Status: AC
  Administered 2022-07-24: 4 mg via INTRAVENOUS
  Filled 2022-07-24: qty 2

## 2022-07-24 MED ORDER — ACETAMINOPHEN 500 MG PO TABS
1000.0000 mg | ORAL_TABLET | Freq: Once | ORAL | Status: AC
  Administered 2022-07-24: 1000 mg via ORAL
  Filled 2022-07-24: qty 2

## 2022-07-24 MED ORDER — SODIUM CHLORIDE 0.9 % IV BOLUS
1000.0000 mL | Freq: Once | INTRAVENOUS | Status: AC
  Administered 2022-07-24: 1000 mL via INTRAVENOUS

## 2022-07-24 MED ORDER — MELATONIN 5 MG PO TABS
5.0000 mg | ORAL_TABLET | Freq: Every day | ORAL | Status: DC
  Administered 2022-07-25 (×2): 5 mg via ORAL
  Filled 2022-07-24 (×2): qty 1

## 2022-07-24 MED ORDER — AMLODIPINE BESYLATE 5 MG PO TABS
5.0000 mg | ORAL_TABLET | Freq: Every day | ORAL | Status: DC
  Administered 2022-07-25: 5 mg via ORAL
  Filled 2022-07-24: qty 1

## 2022-07-24 MED ORDER — SENNOSIDES-DOCUSATE SODIUM 8.6-50 MG PO TABS
1.0000 | ORAL_TABLET | Freq: Two times a day (BID) | ORAL | Status: DC
  Administered 2022-07-25 (×2): 1 via ORAL
  Filled 2022-07-24 (×4): qty 1

## 2022-07-24 MED ORDER — ACETAMINOPHEN 650 MG RE SUPP: 650.0000 mg | Freq: Four times a day (QID) | RECTAL | Status: DC | PRN

## 2022-07-24 MED ORDER — HEPARIN SODIUM (PORCINE) 5000 UNIT/ML IJ SOLN
5000.0000 [IU] | Freq: Three times a day (TID) | INTRAMUSCULAR | Status: DC
  Administered 2022-07-25 (×2): 5000 [IU] via SUBCUTANEOUS
  Filled 2022-07-24 (×3): qty 1

## 2022-07-24 MED ORDER — DIPHENHYDRAMINE HCL 50 MG/ML IJ SOLN
25.0000 mg | Freq: Once | INTRAMUSCULAR | Status: AC
  Administered 2022-07-24: 25 mg via INTRAVENOUS
  Filled 2022-07-24: qty 1

## 2022-07-24 MED ORDER — ACETAMINOPHEN 325 MG PO TABS
650.0000 mg | ORAL_TABLET | Freq: Four times a day (QID) | ORAL | Status: DC | PRN
  Administered 2022-07-25 – 2022-07-26 (×3): 650 mg via ORAL
  Filled 2022-07-24 (×4): qty 2

## 2022-07-24 MED ORDER — PANTOPRAZOLE SODIUM 40 MG PO TBEC
40.0000 mg | DELAYED_RELEASE_TABLET | Freq: Every day | ORAL | Status: DC
  Administered 2022-07-25 – 2022-07-26 (×2): 40 mg via ORAL
  Filled 2022-07-24 (×2): qty 1

## 2022-07-24 MED ORDER — PROCHLORPERAZINE EDISYLATE 10 MG/2ML IJ SOLN: 5.0000 mg | INTRAMUSCULAR | Status: DC | PRN

## 2022-07-24 MED ORDER — SODIUM CHLORIDE 0.9 % IV SOLN: INTRAVENOUS | Status: DC

## 2022-07-24 MED ORDER — LACTULOSE 10 GM/15ML PO SOLN: 20.0000 g | Freq: Two times a day (BID) | ORAL | Status: DC

## 2022-07-24 MED ORDER — POTASSIUM CHLORIDE IN NACL 20-0.9 MEQ/L-% IV SOLN
INTRAVENOUS | Status: DC
  Filled 2022-07-24 (×4): qty 1000

## 2022-07-24 NOTE — H&P (Signed)
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 She's had malignant strictures undergoing ERCP with dilation and pancreatic duct stenting twice. She additionally has HTN w/ recurrent bouts of nephrolithiasis resulting in multiple urologic interventions, migraines, prolonged QT, and constipation.  During chemotherapy 3 days ago, patient started feeling sick.  Per patient she was not sick as a dog.  She had intractable nausea and vomiting and was unable to get control of it.  She is unable to keep her medications down.  She denies diarrhea.  She felt weak, lightheaded, dizzy.  She had no energy.  She endorses that specific abdominal discomfort.  She felt hot, cold and occasionally diaphoretic.  She developed a migraine headache.  She came to the ER.  In the ER her WBCs was 79.3, potassium 2.8, lipase 186.  BP 176/131, pulse 92, Afebrile.  She was given a bolus of normal saline, IV Zofran, IV Benadryl.  She was still unable to keep fluids or medications down.  No significant abnormalities noted on CT abdomen pelvis.  Observation requested for intractable nausea, vomiting and rehydration.  Review of Systems:  Per HPI  Past Medical History: Past Medical History:  Diagnosis Date   Benign essential hypertension 10/06/2017   Last Assessment & Plan:  Formatting of this note might be different from the original. Patient's blood pressure noted to be elevated at today's visit 188/98.  Patient will be provided with antihypertensives in the treatment center if needed in order to meet her Avastin parameters.  Patient recommended to follow up with her primary care physician regarding medication management.  Patient is not cur   Cancer [facility])    Colon cancer (HCC)    Hypertension    Renal calculi    bilateral   Past Surgical History:  Procedure Laterality  Date   BILIARY BRUSHING  03/24/2022   Procedure: BILIARY BRUSHING;  Surgeon: Meridee Score Netty Starring., MD;  Location: [facility] ENDOSCOPY;  Service: Gastroenterology;;   BILIARY DILATION  03/24/2022   Procedure: BILIARY DILATION;  Surgeon: Lemar Lofty., MD;  Location: [facility] ENDOSCOPY;  Service: Gastroenterology;;   BILIARY STENT PLACEMENT  03/24/2022   Procedure: BILIARY STENT PLACEMENT;  Surgeon: Lemar Lofty., MD;  Location: [facility] ENDOSCOPY;  Service: Gastroenterology;;   BIOPSY  03/24/2022   Procedure: BIOPSY;  Surgeon: Lemar Lofty., MD;  Location: [facility] ENDOSCOPY;  Service: Gastroenterology;;   CESAREAN SECTION     x2   CYSTOSCOPY WITH RETROGRADE PYELOGRAM, URETEROSCOPY AND STENT PLACEMENT Right 10/22/2020   Procedure: CYSTOSCOPY WITH RETROGRADE PYELOGRAM, URETEROSCOPY AND STENT PLACEMENT;  Surgeon: Noel Christmas, MD;  Location: [facility] ORS;  Service: Urology;  Laterality: Right;   ERCP N/A 03/24/2022   Procedure: ENDOSCOPIC RETROGRADE CHOLANGIOPANCREATOGRAPHY (ERCP);  Surgeon: Lemar Lofty., MD;  Location: [facility] ENDOSCOPY;  Service: Gastroenterology;  Laterality: N/A;   IR RADIOLOGIST EVAL & MGMT  09/16/2019   IR RADIOLOGIST EVAL & MGMT  09/23/2019   IR RADIOLOGIST EVAL & MGMT  09/20/2019   IR RADIOLOGIST EVAL & MGMT  09/30/2019   IR REMOVAL TUN ACCESS W/ PORT W/O FL MOD SED  09/14/2019   IR VENO/EXT/UNI RIGHT  04/11/2022   PANCREATIC STENT PLACEMENT  03/24/2022   Procedure: PANCREATIC STENT PLACEMENT;  Surgeon: Lemar Lofty., MD;  Location: [facility] ENDOSCOPY;  Service: Gastroenterology;;   REMOVAL OF STONES  03/24/2022  Procedure: REMOVAL OF STONES;  Surgeon: Meridee Score Netty Starring., MD;  Location: [facility] ENDOSCOPY;  Service: Gastroenterology;;   Dennison Mascot  03/24/2022   Procedure: Dennison Mascot;  Surgeon: Mansouraty, Netty Starring., MD;  Location: [facility] ENDOSCOPY;  Service: Gastroenterology;;   URETERAL STENT PLACEMENT      Medications: Prior to Admission medications    Medication Sig Start Date End Date Taking? Authorizing Provider  acetaminophen (TYLENOL) 500 MG tablet Take 1,000 mg by mouth every 6 (six) hours as needed. Patient not taking: Reported on 07/21/2022    [provider]  alum & mag hydroxide-simeth (MAALOX/MYLANTA) 200-200-20 MG/5ML suspension Take 30 mLs by mouth every 6 (six) hours as needed for indigestion or heartburn. Patient not taking: Reported on 06/30/2022 04/18/22   Glade Lloyd, MD  amLODipine (NORVASC) 5 MG tablet Take 1 tablet (5 mg total) by mouth daily. 07/01/22   Tollie Eth, NP  chlorthalidone (HYGROTON) 25 MG tablet Take 1 tablet (25 mg total) by mouth daily. 06/02/22   Tollie Eth, NP  dexamethasone (DECADRON) 4 MG tablet Take 1 tablet twice a day for 3 days beginning day 2 04/28/22   Rana Snare, NP  HYDROcodone-acetaminophen [facility]) 10-325 MG tablet Take 1 tablet by mouth every 6 (six) hours as needed. 07/21/22   Ladene Artist, MD  lactulose (CHRONULAC) 10 GM/15ML solution Take 15 mLs (10 g total) by mouth 2 (two) times daily. Take twice daily until good bowel movement, then decrease to 15 ml once daily Patient not taking: Reported on 06/24/2022 04/04/22   Ladene Artist, MD  Na Sulfate-K Sulfate-Mg Sulf (SUPREP BOWEL PREP KIT) 17.5-3.13-1.6 GM/177ML SOLN Take 1 kit by mouth as directed. For colonoscopy prep Patient not taking: Reported on 06/30/2022 06/24/22   Mansouraty, Netty Starring., MD  ondansetron (ZOFRAN) 4 MG tablet Take 1 tablet (4 mg total) by mouth every 8 (eight) hours as needed for nausea or vomiting. Patient not taking: Reported on 07/01/2022 04/18/22   Glade Lloyd, MD  pantoprazole (PROTONIX) 40 MG tablet Starting on 03/27/2022, take 40 mg by mouth two times a day before meals for 60 days. On Day #61, decrease to 40 mg once a day before a meal. 04/18/22   Glade Lloyd, MD  Polyethylene Glycol 3350 (MIRALAX PO) Take 17 g by mouth See admin instructions. Mix 17 grams of powder into the suggested amount of  water or liquid and drink one to two times a day as needed for constipation Patient not taking: Reported on 06/24/2022    [provider]  potassium chloride (MICRO-K) 10 MEQ CR capsule Take 1 capsule (10 mEq total) by mouth 2 (two) times daily. 07/21/22   Ladene Artist, MD  prochlorperazine (COMPAZINE) 10 MG tablet Take 1 tablet (10 mg total) by mouth every 6 (six) hours as needed for nausea or vomiting. 04/28/22   Rana Snare, NP  senna-docusate (SENOKOT-S) 8.6-50 MG tablet Take 1 tablet by mouth 2 (two) times daily. Patient not taking: Reported on 06/24/2022 04/18/22   Glade Lloyd, MD    Allergies:   Allergies  Allergen Reactions   Lisinopril Swelling and Other (See Comments)    Lip swelling and Angioedema   Irinotecan Other (See Comments)    Stomach cramping Patient given 0.5 mL atropine IV and was able to continue infusion.  See Progress note on 10/03/20.    Leucovorin Calcium Other (See Comments)    Stomach cramping Patient given 0.5 mL atropine IV and was able to continue infusion. See Progress  note on 10/03/20.     Social History:  reports that she has quit smoking. Her smoking use included cigarettes. She has never used smokeless tobacco. She reports that she does not currently use alcohol. She reports that she does not currently use drugs.  Family History: Family History  Problem Relation Age of Onset   Hypertension Mother    Diabetes Mellitus II Father    Stroke Father    Ulcers Sister    Colon cancer Neg Hx    Esophageal cancer Neg Hx    Inflammatory bowel disease Neg Hx    Liver disease Neg Hx    Pancreatic cancer Neg Hx    Rectal cancer Neg Hx    Stomach cancer Neg Hx     Physical Exam: Vitals:   07/24/22 1945 07/24/22 2012 07/24/22 2015 07/24/22 2215  BP: (!) 157/118   (!) 138/94  Pulse:  98 97 90  Resp: 15 15 18 17   Temp:      TempSrc:      SpO2:  97% 97% 96%  Weight:      Height:        General:  Alert and oriented times three, well  developed and nourished, no acute distress Eyes: PERRLA, pink conjunctiva, no scleral icterus ENT: Moist oral mucosa, neck supple, no thyromegaly Lungs: clear to ascultation, no use of accessory muscles Cardiovascular: regular rate and rhythm, no regurgitation, no gallops, no murmurs. No carotid bruits, no JVD Abdomen: soft, positive BS, non-tender, non-distended, no organomegaly, not an acute abdomen GU: not examined Neuro: CN II - XII grossly intact, sensation intact Musculoskeletal: strength 5/5 all extremities, no clubbing, cyanosis or edema Skin: no rash, no subcutaneous crepitation, no decubitus Psych: appropriate patient   Labs on Admission:  Recent Labs    07/24/22 1855  NA 133*  K 2.8*  CL 99  CO2 25  GLUCOSE 91  BUN 12  CREATININE 0.48  CALCIUM 8.7*   Recent Labs    07/24/22 1855  AST 24  ALT 20  ALKPHOS 145*  BILITOT 0.6  PROT 8.2*  ALBUMIN 4.0   Recent Labs    07/24/22 1855  LIPASE 186*   Recent Labs    07/24/22 1855  WBC 79.3*  NEUTROABS 71.4*  HGB 12.1  HCT 36.9  MCV 94.4  PLT 222    Radiological Exams on Admission: CT ABDOMEN PELVIS W CONTRAST  Result Date: 07/24/2022 CLINICAL DATA:  Abdominal pain.  Metastatic colon cancer. EXAM: CT ABDOMEN AND PELVIS WITH CONTRAST TECHNIQUE: Multidetector CT imaging of the abdomen and pelvis was performed using the standard protocol following bolus administration of intravenous contrast. RADIATION DOSE REDUCTION: This exam was performed according to the departmental dose-optimization program which includes automated exposure control, adjustment of the mA and/or kV according to patient size and/or use of iterative reconstruction technique. CONTRAST:  OMNIPAQUE IOHEXOL 300 MG/ML  SOLN COMPARISON:  CT dated 06/25/2022. FINDINGS: Lower chest: Minimal bibasilar atelectasis. The visualized lung bases are otherwise clear. No intra-abdominal free air. Hepatobiliary: The liver is unremarkable. Relatively similar or  minimally improved biliary dilatation. Similar positioning of 2 common bile duct stent. There is infiltration of the fat in the periportal fat in the hepatic hilum. There is a small pneumobilia. High attenuating content within the gallbladder upon likely vicarious excretion of contrast. Pancreas: The pancreas is unremarkable. Spleen: Normal in size without focal abnormality. Adrenals/Urinary Tract: The adrenal glands are unremarkable. Nonobstructing bilateral renal calculi measure 1 cm in the upper pole  of the left kidney. There is a 1.5 cm left renal interpolar cyst. There is no hydronephrosis on either side. The visualized ureters and urinary bladder appear unremarkable. Stomach/Bowel: There is loss of fat plane between the sigmoid colon and the uterus (60/3 and coronal 108/9) which may represent adhesions. Underlying colonic mass is not excluded. Cannot be evaluated on this CT. There is no bowel obstruction or active inflammation. There is moderate stool throughout the colon. The appendix is normal. Vascular/Lymphatic: Mild aortoiliac atherosclerotic disease. The IVC is unremarkable. No portal venous gas. There is no adenopathy. Reproductive: The uterus is poorly visualized but grossly unremarkable. No adnexal masses. Other: Scattered omental nodularity similar to prior CT. There is a small loculated fluid in the left posterior pelvis similar to prior CT. Musculoskeletal: Bilateral L5 pars defects with grade 1 anterolisthesis. No acute osseous pathology. IMPRESSION: 1. No acute intra-abdominal or pelvic pathology. No bowel obstruction. Normal appendix. 2. Similar or minimally improved biliary dilatation with similar positioning of 2 common bile duct stents. 3. Infiltration of the periportal fat at the hepatic hilum similar to prior CT. 4. Nonobstructing bilateral renal calculi. No hydronephrosis. 5. No significant interval change in omental nodularity. 6. Loss of fat plane between the sigmoid colon and uterus,  likely adhesion. Underlying mass is not excluded. 7.  Aortic Atherosclerosis (ICD10-I70.0). Electronically Signed   By: Elgie Collard M.D.   On: 07/24/2022 21:15   DG Chest Portable 1 View  Result Date: 07/24/2022 CLINICAL DATA:  Abdominal pain, colon cancer. EXAM: PORTABLE CHEST 1 VIEW COMPARISON:  Chest x-ray 05/26/2022 FINDINGS: Left upper extremity PICC terminates in the SVC, unchanged. Heart is borderline enlarged. Mediastinal silhouette is stable. Both lungs are clear. The visualized skeletal structures are unremarkable. IMPRESSION: No active disease. Electronically Signed   By: Darliss Cheney M.D.   On: 07/24/2022 20:50    Assessment/Plan Present on Admission:  Intractable nausea and vomiting -Zofran as needed, IV fluid hydration -Clear liquid diet, advance as tolerated -Monitor  Hypokalemia -Repleting and IV fluids.  BMP in a.m. -Mag level added  Significant leukocytosis -EDP contacted oncologist Dr. Grayling Congress.  Patient received G-CSF injection after chemotherapy.  This robust response is not unexpected.  No evidence of infection.  Blood cultures collected.  No antibiotics started.   Colorectal cancer, stage IV (HCC) -Per oncology   Prolonged QT interval -Correcting hypokalemia.  Magnesium level normal.  Repeat EKG once potassium level normalized  Oddie Bottger 07/24/2022, 10:35 PM

## 2022-07-24 NOTE — Telephone Encounter (Signed)
The patient contacted our office at 1600 to report symptoms of vomiting, headache, fatigue, and sweating. I recommended that the patient seek medical attention at the [facility] Emergency Department to be evaluated by a healthcare provider. The patient confirmed that they would contact emergency medical services and proceed to the emergency department.

## 2022-07-24 NOTE — ED Notes (Signed)
ED TO INPATIENT HANDOFF REPORT  ED Nurse Name and Phone #: Claiborne Rigg Name/Age/Gender April [name] 47 y.o. female Room/Bed: WA18/WA18  Code Status   Code Status: Prior  Home/SNF/Other Home Patient oriented to: self, place, time, and situation Is this baseline? Yes   Triage Complete: Triage complete  Chief Complaint Intractable nausea and vomiting [R11.2]  Triage Note Per EMS, pt has hx of stage 4 colon cancer. Completed treatment yesterday, last night she became diaphoretic, experienced abd pain, headache, and N/V. Given 4mg  zofran, and 500 ml NS by EMS.    Allergies Allergies  Allergen Reactions   Lisinopril Swelling and Other (See Comments)    Lip swelling and Angioedema   Irinotecan Other (See Comments)    Stomach cramping Patient given 0.5 mL atropine IV and was able to continue infusion.  See Progress note on 10/03/20.    Leucovorin Calcium Other (See Comments)    Stomach cramping Patient given 0.5 mL atropine IV and was able to continue infusion. See Progress note on 10/03/20.     Level of Care/Admitting Diagnosis ED Disposition     ED Disposition  Admit   Condition  --   Comment  Hospital Area: [facility] [[id number]]  Level of Care: Med-Surg [16]  May place patient in observation at [facility] or [facility] if equivalent level of care is available:: Yes  Covid Evaluation: Asymptomatic - no recent exposure (last 10 days) testing not required  Diagnosis: Intractable nausea and vomiting [720114]  Admitting Physician: Gery Pray [[id number]]  Attending Physician: Alvester Chou          B Medical/Surgery History Past Medical History:  Diagnosis Date   Benign essential hypertension 10/06/2017   Last Assessment & Plan:  Formatting of this note might be different from the original. Patient's blood pressure noted to be elevated at today's visit 188/98.  Patient will be provided with antihypertensives in the treatment  center if needed in order to meet her Avastin parameters.  Patient recommended to follow up with her primary care physician regarding medication management.  Patient is not cur   Cancer [facility])    Colon cancer (HCC)    Hypertension    Renal calculi    bilateral   Past Surgical History:  Procedure Laterality Date   BILIARY BRUSHING  03/24/2022   Procedure: BILIARY BRUSHING;  Surgeon: Meridee Score Netty Starring., MD;  Location: [facility] ENDOSCOPY;  Service: Gastroenterology;;   BILIARY DILATION  03/24/2022   Procedure: BILIARY DILATION;  Surgeon: Lemar Lofty., MD;  Location: [facility] ENDOSCOPY;  Service: Gastroenterology;;   BILIARY STENT PLACEMENT  03/24/2022   Procedure: BILIARY STENT PLACEMENT;  Surgeon: Lemar Lofty., MD;  Location: [facility] ENDOSCOPY;  Service: Gastroenterology;;   BIOPSY  03/24/2022   Procedure: BIOPSY;  Surgeon: Lemar Lofty., MD;  Location: [facility] ENDOSCOPY;  Service: Gastroenterology;;   CESAREAN SECTION     x2   CYSTOSCOPY WITH RETROGRADE PYELOGRAM, URETEROSCOPY AND STENT PLACEMENT Right 10/22/2020   Procedure: CYSTOSCOPY WITH RETROGRADE PYELOGRAM, URETEROSCOPY AND STENT PLACEMENT;  Surgeon: Noel Christmas, MD;  Location: [facility] ORS;  Service: Urology;  Laterality: Right;   ERCP N/A 03/24/2022   Procedure: ENDOSCOPIC RETROGRADE CHOLANGIOPANCREATOGRAPHY (ERCP);  Surgeon: Lemar Lofty., MD;  Location: [facility] ENDOSCOPY;  Service: Gastroenterology;  Laterality: N/A;   IR RADIOLOGIST EVAL & MGMT  09/16/2019   IR RADIOLOGIST EVAL & MGMT  09/23/2019   IR RADIOLOGIST EVAL & MGMT  09/20/2019   IR RADIOLOGIST EVAL &  MGMT  09/30/2019   IR REMOVAL TUN ACCESS W/ PORT W/O FL MOD SED  09/14/2019   IR VENO/EXT/UNI RIGHT  04/11/2022   PANCREATIC STENT PLACEMENT  03/24/2022   Procedure: PANCREATIC STENT PLACEMENT;  Surgeon: Lemar Lofty., MD;  Location: [facility] ENDOSCOPY;  Service: Gastroenterology;;   REMOVAL OF STONES  03/24/2022   Procedure: REMOVAL OF STONES;  Surgeon:  Lemar Lofty., MD;  Location: [facility] ENDOSCOPY;  Service: Gastroenterology;;   Dennison Mascot  03/24/2022   Procedure: SPHINCTEROTOMY;  Surgeon: Lemar Lofty., MD;  Location: [facility] ENDOSCOPY;  Service: Gastroenterology;;   URETERAL STENT PLACEMENT       A IV Location/Drains/Wounds Patient Lines/Drains/Airways Status     Active Line/Drains/Airways     Name Placement date Placement time Site Days   Peripheral IV 07/24/22 22 G 1" Posterior;Right Hand 07/24/22  1836  Hand  less than 1   Peripheral IV 07/24/22 20 G 1" Left Antecubital 07/24/22  2040  Antecubital  less than 1   PICC Double Lumen 04/11/22 Left Basilic 39 cm 04/11/22  1235  -- 104   Ureteral Drain/Stent Right ureter 6 Fr. 10/23/20  0011  Right ureter  639   GI Stent 03/24/22  1504  --  122   GI Stent 03/24/22  1548  --  122   GI Stent 03/24/22  1551  --  122            Intake/Output Last 24 hours No intake or output data in the 24 hours ending 07/24/22 2248  Labs/Imaging Results for orders placed or performed during the hospital encounter of 07/24/22 (from the past 48 hour(s))  Comprehensive metabolic panel     Status: Abnormal   Collection Time: 07/24/22  6:55 PM  Result Value Ref Range   Sodium 133 (L) 135 - 145 mmol/L   Potassium 2.8 (L) 3.5 - 5.1 mmol/L   Chloride 99 98 - 111 mmol/L   CO2 25 22 - 32 mmol/L   Glucose, Bld 91 70 - 99 mg/dL    Comment: Glucose reference range applies only to samples taken after fasting for at least 8 hours.   BUN 12 6 - 20 mg/dL   Creatinine, Ser 1.61 0.44 - 1.00 mg/dL   Calcium 8.7 (L) 8.9 - 10.3 mg/dL   Total Protein 8.2 (H) 6.5 - 8.1 g/dL   Albumin 4.0 3.5 - 5.0 g/dL   AST 24 15 - 41 U/L   ALT 20 0 - 44 U/L   Alkaline Phosphatase 145 (H) 38 - 126 U/L   Total Bilirubin 0.6 0.3 - 1.2 mg/dL   GFR, Estimated >09 >60 mL/min    Comment: (NOTE) Calculated using the CKD-EPI Creatinine Equation (2021)    Anion gap 9 5 - 15    Comment: Performed at [facility], [address], [place], [place] [address]  Lipase, blood     Status: Abnormal   Collection Time: 07/24/22  6:55 PM  Result Value Ref Range   Lipase 186 (H) 11 - 51 U/L    Comment: Performed at [facility], [address], [place], [place] [address]  CBC with Diff     Status: Abnormal   Collection Time: 07/24/22  6:55 PM  Result Value Ref Range   WBC 79.3 (HH) 4.0 - 10.5 K/uL    Comment: REPEATED TO VERIFY THIS CRITICAL RESULT HAS VERIFIED AND BEEN CALLED TO Nathanial Millman, RN BY Lucendia Herrlich ON 07 11 2024 AT 1952,  AND HAS BEEN READ BACK.     RBC 3.91 3.87 - 5.11 MIL/uL   Hemoglobin 12.1 12.0 - 15.0 g/dL   HCT 16.1 09.6 - 04.5 %   MCV 94.4 80.0 - 100.0 fL   MCH 30.9 26.0 - 34.0 pg   MCHC 32.8 30.0 - 36.0 g/dL   RDW 40.9 (H) 81.1 - 91.4 %   Platelets 222 150 - 400 K/uL   nRBC 0.0 0.0 - 0.2 %   Neutrophils Relative % 90 %   Neutro Abs 71.4 (H) 1.7 - 7.7 K/uL   Lymphocytes Relative 2 %   Lymphs Abs 1.8 0.7 - 4.0 K/uL   Monocytes Relative 2 %   Monocytes Absolute 1.3 (H) 0.1 - 1.0 K/uL   Eosinophils Relative 0 %   Eosinophils Absolute 0.1 0.0 - 0.5 K/uL   Basophils Relative 0 %   Basophils Absolute 0.1 0.0 - 0.1 K/uL   WBC Morphology DOHLE BODIES     Comment: VACUOLATED NEUTROPHILS   Immature Granulocytes 6 %   Abs Immature Granulocytes 4.63 (H) 0.00 - 0.07 K/uL   Dohle Bodies PRESENT     Comment: Performed at [facility], [address], [place], [place] [address]  Urinalysis, Routine w reflex microscopic -Urine, Clean Catch     Status: Abnormal   Collection Time: 07/24/22  8:00 PM  Result Value Ref Range   Color, Urine YELLOW YELLOW   APPearance CLEAR CLEAR   Specific Gravity, Urine 1.026 1.005 - 1.030   pH 5.0 5.0 - 8.0   Glucose, UA NEGATIVE NEGATIVE mg/dL   Hgb urine dipstick NEGATIVE NEGATIVE   Bilirubin Urine NEGATIVE NEGATIVE   Ketones, ur 20 (A) NEGATIVE mg/dL   Protein, ur 621 (A) NEGATIVE mg/dL    Nitrite NEGATIVE NEGATIVE   Leukocytes,Ua NEGATIVE NEGATIVE   RBC / HPF 0-5 0 - 5 RBC/hpf   WBC, UA 0-5 0 - 5 WBC/hpf   Bacteria, UA RARE (A) NONE SEEN   Squamous Epithelial / HPF 0-5 0 - 5 /HPF   Mucus PRESENT    Hyaline Casts, UA PRESENT     Comment: Performed at [facility], [address] [place], [place] [address]  Lactic acid, plasma     Status: None   Collection Time: 07/24/22  8:40 PM  Result Value Ref Range   Lactic Acid, Venous 1.3 0.5 - 1.9 mmol/L    Comment: Performed at [facility], [address] [place], [place] [address]   CT ABDOMEN PELVIS W CONTRAST  Result Date: 07/24/2022 CLINICAL DATA:  Abdominal pain.  Metastatic colon cancer. EXAM: CT ABDOMEN AND PELVIS WITH CONTRAST TECHNIQUE: Multidetector CT imaging of the abdomen and pelvis was performed using the standard protocol following bolus administration of intravenous contrast. RADIATION DOSE REDUCTION: This exam was performed according to the departmental dose-optimization program which includes automated exposure control, adjustment of the mA and/or kV according to patient size and/or use of iterative reconstruction technique. CONTRAST:  OMNIPAQUE IOHEXOL 300 MG/ML  SOLN COMPARISON:  CT dated 06/25/2022. FINDINGS: Lower chest: Minimal bibasilar atelectasis. The visualized lung bases are otherwise clear. No intra-abdominal free air. Hepatobiliary: The liver is unremarkable. Relatively similar or minimally improved biliary dilatation. Similar positioning of 2 common bile duct stent. There is infiltration of the fat in the periportal fat in the hepatic hilum. There is a small pneumobilia. High attenuating content within the gallbladder upon likely vicarious excretion of contrast. Pancreas: The pancreas is unremarkable. Spleen:  Normal in size without focal abnormality. Adrenals/Urinary Tract: The adrenal glands are unremarkable. Nonobstructing bilateral renal calculi measure 1 cm in  the upper pole of the left kidney. There is a 1.5 cm left renal interpolar cyst. There is no hydronephrosis on either side. The visualized ureters and urinary bladder appear unremarkable. Stomach/Bowel: There is loss of fat plane between the sigmoid colon and the uterus (60/3 and coronal 108/9) which may represent adhesions. Underlying colonic mass is not excluded. Cannot be evaluated on this CT. There is no bowel obstruction or active inflammation. There is moderate stool throughout the colon. The appendix is normal. Vascular/Lymphatic: Mild aortoiliac atherosclerotic disease. The IVC is unremarkable. No portal venous gas. There is no adenopathy. Reproductive: The uterus is poorly visualized but grossly unremarkable. No adnexal masses. Other: Scattered omental nodularity similar to prior CT. There is a small loculated fluid in the left posterior pelvis similar to prior CT. Musculoskeletal: Bilateral L5 pars defects with grade 1 anterolisthesis. No acute osseous pathology. IMPRESSION: 1. No acute intra-abdominal or pelvic pathology. No bowel obstruction. Normal appendix. 2. Similar or minimally improved biliary dilatation with similar positioning of 2 common bile duct stents. 3. Infiltration of the periportal fat at the hepatic hilum similar to prior CT. 4. Nonobstructing bilateral renal calculi. No hydronephrosis. 5. No significant interval change in omental nodularity. 6. Loss of fat plane between the sigmoid colon and uterus, likely adhesion. Underlying mass is not excluded. 7.  Aortic Atherosclerosis (ICD10-I70.0). Electronically Signed   By: Elgie Collard M.D.   On: 07/24/2022 21:15   DG Chest Portable 1 View  Result Date: 07/24/2022 CLINICAL DATA:  Abdominal pain, colon cancer. EXAM: PORTABLE CHEST 1 VIEW COMPARISON:  Chest x-ray 05/26/2022 FINDINGS: Left upper extremity PICC terminates in the SVC, unchanged. Heart is borderline enlarged. Mediastinal silhouette is stable. Both lungs are clear. The  visualized skeletal structures are unremarkable. IMPRESSION: No active disease. Electronically Signed   By: Darliss Cheney M.D.   On: 07/24/2022 20:50    Pending Labs Unresulted Labs (From admission, onward)     Start     Ordered   07/24/22 2227  Magnesium  Once,   STAT        07/24/22 2226   07/24/22 2022  Blood culture (routine x 2)  BLOOD CULTURE X 2,   R (with STAT occurrences)      07/24/22 2021            Vitals/Pain Today's Vitals   07/24/22 1945 07/24/22 2012 07/24/22 2015 07/24/22 2215  BP: (!) 157/118   (!) 138/94  Pulse:  98 97 90  Resp: 15 15 18 17   Temp:      TempSrc:      SpO2:  97% 97% 96%  Weight:      Height:      PainSc:        Isolation Precautions No active isolations  Medications Medications  sodium chloride 0.9 % bolus 1,000 mL (1,000 mLs Intravenous New Bag/Given 07/24/22 1840)    And  0.9 %  sodium chloride infusion ( Intravenous New Bag/Given 07/24/22 1840)  potassium chloride 10 mEq in 100 mL IVPB (10 mEq Intravenous New Bag/Given 07/24/22 2217)  HYDROmorphone (DILAUDID) injection 1 mg (has no administration in time range)  HYDROmorphone (DILAUDID) injection 1 mg (1 mg Intravenous Given 07/24/22 1841)  ondansetron (ZOFRAN) injection 4 mg (4 mg Intravenous Given 07/24/22 1841)  diphenhydrAMINE (BENADRYL) injection 25 mg (25 mg Intravenous Given 07/24/22 1847)  acetaminophen (TYLENOL) tablet  1,000 mg (1,000 mg Oral Given 07/24/22 2022)  iohexol (OMNIPAQUE) 300 MG/ML solution 100 mL (100 mLs Intravenous Contrast Given 07/24/22 2049)    Mobility walks     Focused Assessments GI- c/o nausea and abdominal pain. Hx of stage 4 colon cancer and actively getting chemo. Also has been complaining of headache. Per dayshift RN pt had reaction to dilaudid and was itching. Pt received benadryl and symptoms have improved.    R Recommendations: See Admitting Provider Note  Report given to:   Additional Notes: n/a

## 2022-07-24 NOTE — ED Triage Notes (Signed)
Per EMS, pt has hx of stage 4 colon cancer. Completed treatment yesterday, last night she became diaphoretic, experienced abd pain, headache, and N/V. Given 4mg  zofran, and 500 ml NS by EMS.

## 2022-07-24 NOTE — ED Notes (Signed)
Patient transported to CT 

## 2022-07-24 NOTE — ED Provider Notes (Signed)
[place] EMERGENCY DEPARTMENT AT [facility] Provider Note   CSN: [id number] Arrival date & time: 07/24/22  1739     History  Chief Complaint  Patient presents with   Nausea   Headache   Abdominal Pain    April [name] is a 47 y.o. female.   Headache Associated symptoms: abdominal pain   Abdominal Pain    Patient has a history of hypertension, kidney stones, colorectal cancer undergoing chemotherapy treatment.  Patient had a treatment yesterday of UDENYNCA.  Patient states she started feeling poorly after the treatment.  She began having trouble with nausea and vomiting.  She has had abdominal pain.  She also started experiencing headache.  She has intermittently felt chilled and then hot.  She has not measured any fever but has not taken her temperature.  She is not having trouble urinating.  She has not had a bowel movement since the treatment.  Patient was feeling worse today so she called the oncology center and they instructed her to come to the ED for treatment.  Home Medications Prior to Admission medications   Medication Sig Start Date End Date Taking? Authorizing Provider  acetaminophen (TYLENOL) 500 MG tablet Take 1,000 mg by mouth every 6 (six) hours as needed. Patient not taking: Reported on 07/21/2022    [provider]  alum & mag hydroxide-simeth (MAALOX/MYLANTA) 200-200-20 MG/5ML suspension Take 30 mLs by mouth every 6 (six) hours as needed for indigestion or heartburn. Patient not taking: Reported on 06/30/2022 04/18/22   Glade Lloyd, MD  amLODipine (NORVASC) 5 MG tablet Take 1 tablet (5 mg total) by mouth daily. 07/01/22   Tollie Eth, NP  chlorthalidone (HYGROTON) 25 MG tablet Take 1 tablet (25 mg total) by mouth daily. 06/02/22   Tollie Eth, NP  dexamethasone (DECADRON) 4 MG tablet Take 1 tablet twice a day for 3 days beginning day 2 04/28/22   Rana Snare, NP  HYDROcodone-acetaminophen [facility]) 10-325 MG tablet Take 1 tablet by  mouth every 6 (six) hours as needed. 07/21/22   Ladene Artist, MD  lactulose (CHRONULAC) 10 GM/15ML solution Take 15 mLs (10 g total) by mouth 2 (two) times daily. Take twice daily until good bowel movement, then decrease to 15 ml once daily Patient not taking: Reported on 06/24/2022 04/04/22   Ladene Artist, MD  Na Sulfate-K Sulfate-Mg Sulf (SUPREP BOWEL PREP KIT) 17.5-3.13-1.6 GM/177ML SOLN Take 1 kit by mouth as directed. For colonoscopy prep Patient not taking: Reported on 06/30/2022 06/24/22   Mansouraty, Netty Starring., MD  ondansetron (ZOFRAN) 4 MG tablet Take 1 tablet (4 mg total) by mouth every 8 (eight) hours as needed for nausea or vomiting. Patient not taking: Reported on 07/01/2022 04/18/22   Glade Lloyd, MD  pantoprazole (PROTONIX) 40 MG tablet Starting on 03/27/2022, take 40 mg by mouth two times a day before meals for 60 days. On Day #61, decrease to 40 mg once a day before a meal. 04/18/22   Glade Lloyd, MD  Polyethylene Glycol 3350 (MIRALAX PO) Take 17 g by mouth See admin instructions. Mix 17 grams of powder into the suggested amount of water or liquid and drink one to two times a day as needed for constipation Patient not taking: Reported on 06/24/2022    [provider]  potassium chloride (MICRO-K) 10 MEQ CR capsule Take 1 capsule (10 mEq total) by mouth 2 (two) times daily. 07/21/22   Ladene Artist, MD  prochlorperazine (COMPAZINE) 10 MG  tablet Take 1 tablet (10 mg total) by mouth every 6 (six) hours as needed for nausea or vomiting. 04/28/22   Rana Snare, NP  senna-docusate (SENOKOT-S) 8.6-50 MG tablet Take 1 tablet by mouth 2 (two) times daily. Patient not taking: Reported on 06/24/2022 04/18/22   Glade Lloyd, MD      Allergies    Lisinopril, Irinotecan, and Leucovorin calcium    Review of Systems   Review of Systems  Gastrointestinal:  Positive for abdominal pain.  Neurological:  Positive for headaches.    Physical Exam Updated Vital Signs BP (!) 138/94    Pulse 90   Temp 99.2 F (37.3 C) (Oral)   Resp 17   Ht 1.626 m (5\' 4" )   Wt 68.9 kg   LMP 01/07/2006   SpO2 96%   BMI 26.09 kg/m  Physical Exam Vitals and nursing note reviewed.  Constitutional:      Appearance: She is well-developed. She is ill-appearing.  HENT:     Head: Normocephalic and atraumatic.     Right Ear: External ear normal.     Left Ear: External ear normal.  Eyes:     General: No scleral icterus.       Right eye: No discharge.        Left eye: No discharge.     Conjunctiva/sclera: Conjunctivae normal.  Neck:     Trachea: No tracheal deviation.  Cardiovascular:     Rate and Rhythm: Normal rate and regular rhythm.  Pulmonary:     Effort: Pulmonary effort is normal. No respiratory distress.     Breath sounds: Normal breath sounds. No stridor. No wheezing or rales.  Abdominal:     General: Bowel sounds are normal. There is no distension.     Palpations: Abdomen is soft.     Tenderness: There is abdominal tenderness. There is no guarding or rebound.     Comments: Generalized tenderness, no guarding or rebound  Musculoskeletal:        General: No tenderness or deformity.     Cervical back: Neck supple. No rigidity.  Skin:    General: Skin is warm and dry.     Findings: No rash.  Neurological:     General: No focal deficit present.     Mental Status: She is alert.     Cranial Nerves: No cranial nerve deficit, dysarthria or facial asymmetry.     Sensory: No sensory deficit.     Motor: No abnormal muscle tone or seizure activity.     Coordination: Coordination normal.  Psychiatric:        Mood and Affect: Mood normal.     ED Results / Procedures / Treatments   Labs (all labs ordered are listed, but only abnormal results are displayed) Labs Reviewed  COMPREHENSIVE METABOLIC PANEL - Abnormal; Notable for the following components:      Result Value   Sodium 133 (*)    Potassium 2.8 (*)    Calcium 8.7 (*)    Total Protein 8.2 (*)    Alkaline  Phosphatase 145 (*)    All other components within normal limits  LIPASE, BLOOD - Abnormal; Notable for the following components:   Lipase 186 (*)    All other components within normal limits  CBC WITH DIFFERENTIAL/PLATELET - Abnormal; Notable for the following components:   WBC 79.3 (*)    RDW 17.8 (*)    Neutro Abs 71.4 (*)    Monocytes Absolute 1.3 (*)    Abs  Immature Granulocytes 4.63 (*)    All other components within normal limits  URINALYSIS, ROUTINE W REFLEX MICROSCOPIC - Abnormal; Notable for the following components:   Ketones, ur 20 (*)    Protein, ur 100 (*)    Bacteria, UA RARE (*)    All other components within normal limits  CULTURE, BLOOD (ROUTINE X 2)  CULTURE, BLOOD (ROUTINE X 2)  LACTIC ACID, PLASMA  LACTIC ACID, PLASMA  MAGNESIUM    EKG EKG Interpretation Date/Time:  Thursday July 24 2022 18:58:46 EDT Ventricular Rate:  88 PR Interval:  141 QRS Duration:  94 QT Interval:  414 QTC Calculation: 501 R Axis:   48  Text Interpretation: Sinus rhythm Left atrial enlargement Borderline prolonged QT interval Confirmed by Linwood Dibbles [phone]) on 07/24/2022 7:21:02 PM  Radiology CT ABDOMEN PELVIS W CONTRAST  Result Date: 07/24/2022 CLINICAL DATA:  Abdominal pain.  Metastatic colon cancer. EXAM: CT ABDOMEN AND PELVIS WITH CONTRAST TECHNIQUE: Multidetector CT imaging of the abdomen and pelvis was performed using the standard protocol following bolus administration of intravenous contrast. RADIATION DOSE REDUCTION: This exam was performed according to the departmental dose-optimization program which includes automated exposure control, adjustment of the mA and/or kV according to patient size and/or use of iterative reconstruction technique. CONTRAST:  OMNIPAQUE IOHEXOL 300 MG/ML  SOLN COMPARISON:  CT dated 06/25/2022. FINDINGS: Lower chest: Minimal bibasilar atelectasis. The visualized lung bases are otherwise clear. No intra-abdominal free air. Hepatobiliary: The  liver is unremarkable. Relatively similar or minimally improved biliary dilatation. Similar positioning of 2 common bile duct stent. There is infiltration of the fat in the periportal fat in the hepatic hilum. There is a small pneumobilia. High attenuating content within the gallbladder upon likely vicarious excretion of contrast. Pancreas: The pancreas is unremarkable. Spleen: Normal in size without focal abnormality. Adrenals/Urinary Tract: The adrenal glands are unremarkable. Nonobstructing bilateral renal calculi measure 1 cm in the upper pole of the left kidney. There is a 1.5 cm left renal interpolar cyst. There is no hydronephrosis on either side. The visualized ureters and urinary bladder appear unremarkable. Stomach/Bowel: There is loss of fat plane between the sigmoid colon and the uterus (60/3 and coronal 108/9) which may represent adhesions. Underlying colonic mass is not excluded. Cannot be evaluated on this CT. There is no bowel obstruction or active inflammation. There is moderate stool throughout the colon. The appendix is normal. Vascular/Lymphatic: Mild aortoiliac atherosclerotic disease. The IVC is unremarkable. No portal venous gas. There is no adenopathy. Reproductive: The uterus is poorly visualized but grossly unremarkable. No adnexal masses. Other: Scattered omental nodularity similar to prior CT. There is a small loculated fluid in the left posterior pelvis similar to prior CT. Musculoskeletal: Bilateral L5 pars defects with grade 1 anterolisthesis. No acute osseous pathology. IMPRESSION: 1. No acute intra-abdominal or pelvic pathology. No bowel obstruction. Normal appendix. 2. Similar or minimally improved biliary dilatation with similar positioning of 2 common bile duct stents. 3. Infiltration of the periportal fat at the hepatic hilum similar to prior CT. 4. Nonobstructing bilateral renal calculi. No hydronephrosis. 5. No significant interval change in omental nodularity. 6. Loss of fat  plane between the sigmoid colon and uterus, likely adhesion. Underlying mass is not excluded. 7.  Aortic Atherosclerosis (ICD10-I70.0). Electronically Signed   By: Elgie Collard M.D.   On: 07/24/2022 21:15   DG Chest Portable 1 View  Result Date: 07/24/2022 CLINICAL DATA:  Abdominal pain, colon cancer. EXAM: PORTABLE CHEST 1 VIEW COMPARISON:  Chest x-ray  05/26/2022 FINDINGS: Left upper extremity PICC terminates in the SVC, unchanged. Heart is borderline enlarged. Mediastinal silhouette is stable. Both lungs are clear. The visualized skeletal structures are unremarkable. IMPRESSION: No active disease. Electronically Signed   By: Darliss Cheney M.D.   On: 07/24/2022 20:50    Procedures Procedures    Medications Ordered in ED Medications  sodium chloride 0.9 % bolus 1,000 mL (1,000 mLs Intravenous New Bag/Given 07/24/22 1840)    And  0.9 %  sodium chloride infusion ( Intravenous New Bag/Given 07/24/22 1840)  potassium chloride 10 mEq in 100 mL IVPB (10 mEq Intravenous New Bag/Given 07/24/22 2217)  HYDROmorphone (DILAUDID) injection 1 mg (1 mg Intravenous Given 07/24/22 1841)  ondansetron (ZOFRAN) injection 4 mg (4 mg Intravenous Given 07/24/22 1841)  diphenhydrAMINE (BENADRYL) injection 25 mg (25 mg Intravenous Given 07/24/22 1847)  acetaminophen (TYLENOL) tablet 1,000 mg (1,000 mg Oral Given 07/24/22 2022)  iohexol (OMNIPAQUE) 300 MG/ML solution 100 mL (100 mLs Intravenous Contrast Given 07/24/22 2049)    ED Course/ Medical Decision Making/ A&P Clinical Course as of 07/24/22 2230  Thu Jul 24, 2022  1845 Patient having some itching after the Dilaudid.  Will give a dose of Benadryl. [JK]  2004 Metabolic panel shows hypokalemia.  Lipase elevated. [JK]  2005 CBC with Diff(!!) White blood cell count elevated at 79.3.  Significantly higher than previous [JK]  2154 Urinalysis not suggestive of infection. [JK]  2154 Abdominal pelvic CT without acute findings [JK]    Clinical Course User Index [JK]  Linwood Dibbles, MD                             Medical Decision Making Problems Addressed: Leukocytosis, unspecified type: acute illness or injury that poses a threat to life or bodily functions Malignant neoplasm of colon, unspecified part of colon [facility]): chronic illness or injury with exacerbation, progression, or side effects of treatment Nausea and vomiting, unspecified vomiting type: acute illness or injury that poses a threat to life or bodily functions  Amount and/or Complexity of Data Reviewed Labs: ordered. Decision-making details documented in ED Course. Radiology: ordered and independent interpretation performed.  Risk OTC drugs. Prescription drug management. Decision regarding hospitalization.   Patient presented to the ED for evaluation of nausea vomiting and abdominal pain headaches weakness after recent chemotherapy treatment.  No evidence of anemia on evaluation.  Patient does have a component of dehydration and hyponatremia and hypokalemia.  Patient noted significant elevated white blood cell count.  She was given Pegfilgratim recently.  Discussed with oncology Dr. Truett Perna.  This degree of leukocytosis is not unusual after this type of treatment.  CT scan was performed to evaluate for any acute infectious etiology.  CT scan without acute process.  I suspect patient's symptoms are related to her chemotherapy treatment.  Patient however is still having malaise nausea and was feeling weak.  Will consult the medical service to see about admission overnight for symptom control and management.        Final Clinical Impression(s) / ED Diagnoses Final diagnoses:  Nausea and vomiting, unspecified vomiting type  Leukocytosis, unspecified type  Malignant neoplasm of colon, unspecified part of colon [facility])    Rx / DC Orders ED Discharge Orders     None         Linwood Dibbles, MD 07/24/22 2230

## 2022-07-25 DIAGNOSIS — C786 Secondary malignant neoplasm of retroperitoneum and peritoneum: Secondary | ICD-10-CM | POA: Diagnosis present

## 2022-07-25 DIAGNOSIS — Z833 Family history of diabetes mellitus: Secondary | ICD-10-CM | POA: Diagnosis not present

## 2022-07-25 DIAGNOSIS — R9431 Abnormal electrocardiogram [ECG] [EKG]: Secondary | ICD-10-CM | POA: Diagnosis present

## 2022-07-25 DIAGNOSIS — T451X5A Adverse effect of antineoplastic and immunosuppressive drugs, initial encounter: Secondary | ICD-10-CM | POA: Diagnosis present

## 2022-07-25 DIAGNOSIS — E876 Hypokalemia: Secondary | ICD-10-CM | POA: Diagnosis present

## 2022-07-25 DIAGNOSIS — Z823 Family history of stroke: Secondary | ICD-10-CM | POA: Diagnosis not present

## 2022-07-25 DIAGNOSIS — Z8249 Family history of ischemic heart disease and other diseases of the circulatory system: Secondary | ICD-10-CM | POA: Diagnosis not present

## 2022-07-25 DIAGNOSIS — Z6826 Body mass index (BMI) 26.0-26.9, adult: Secondary | ICD-10-CM | POA: Diagnosis not present

## 2022-07-25 DIAGNOSIS — Z79899 Other long term (current) drug therapy: Secondary | ICD-10-CM | POA: Diagnosis not present

## 2022-07-25 DIAGNOSIS — C19 Malignant neoplasm of rectosigmoid junction: Secondary | ICD-10-CM | POA: Diagnosis present

## 2022-07-25 DIAGNOSIS — C189 Malignant neoplasm of colon, unspecified: Secondary | ICD-10-CM | POA: Diagnosis not present

## 2022-07-25 DIAGNOSIS — E43 Unspecified severe protein-calorie malnutrition: Secondary | ICD-10-CM | POA: Diagnosis present

## 2022-07-25 DIAGNOSIS — Z888 Allergy status to other drugs, medicaments and biological substances status: Secondary | ICD-10-CM | POA: Diagnosis not present

## 2022-07-25 DIAGNOSIS — Z87891 Personal history of nicotine dependence: Secondary | ICD-10-CM | POA: Diagnosis not present

## 2022-07-25 DIAGNOSIS — Z87442 Personal history of urinary calculi: Secondary | ICD-10-CM | POA: Diagnosis not present

## 2022-07-25 DIAGNOSIS — G43909 Migraine, unspecified, not intractable, without status migrainosus: Secondary | ICD-10-CM | POA: Diagnosis present

## 2022-07-25 DIAGNOSIS — D72829 Elevated white blood cell count, unspecified: Secondary | ICD-10-CM | POA: Diagnosis present

## 2022-07-25 DIAGNOSIS — I10 Essential (primary) hypertension: Secondary | ICD-10-CM | POA: Diagnosis present

## 2022-07-25 DIAGNOSIS — R112 Nausea with vomiting, unspecified: Secondary | ICD-10-CM | POA: Diagnosis present

## 2022-07-25 LAB — COMPREHENSIVE METABOLIC PANEL
ALT: 19 U/L (ref 0–44)
AST: 22 U/L (ref 15–41)
Albumin: 3.6 g/dL (ref 3.5–5.0)
Alkaline Phosphatase: 143 U/L — ABNORMAL HIGH (ref 38–126)
Anion gap: 9 (ref 5–15)
BUN: 8 mg/dL (ref 6–20)
CO2: 23 mmol/L (ref 22–32)
Calcium: 8.7 mg/dL — ABNORMAL LOW (ref 8.9–10.3)
Chloride: 102 mmol/L (ref 98–111)
Creatinine, Ser: 0.46 mg/dL (ref 0.44–1.00)
GFR, Estimated: >60 mL/min (ref >60)
Glucose, Bld: 94 mg/dL (ref 70–99)
Potassium: 3 mmol/L — ABNORMAL LOW (ref 3.5–5.1)
Sodium: 134 mmol/L — ABNORMAL LOW (ref 135–145)
Total Bilirubin: 0.6 mg/dL (ref 0.3–1.2)
Total Protein: 7 g/dL (ref 6.5–8.1)

## 2022-07-25 LAB — CBC WITH DIFFERENTIAL/PLATELET
Abs Immature Granulocytes: 0 10*3/uL (ref 0.00–0.07)
Basophils Absolute: 0 10*3/uL (ref 0.0–0.1)
Basophils Relative: 0 %
Eosinophils Absolute: 0 10*3/uL (ref 0.0–0.5)
Eosinophils Relative: 0 %
HCT: 35.6 % — ABNORMAL LOW (ref 36.0–46.0)
Hemoglobin: 11.6 g/dL — ABNORMAL LOW (ref 12.0–15.0)
Lymphocytes Relative: 2 %
Lymphs Abs: 1.6 10*3/uL (ref 0.7–4.0)
MCH: 31.2 pg (ref 26.0–34.0)
MCHC: 32.6 g/dL (ref 30.0–36.0)
MCV: 95.7 fL (ref 80.0–100.0)
Monocytes Absolute: 0.8 10*3/uL (ref 0.1–1.0)
Monocytes Relative: 1 %
Neutro Abs: 77.6 10*3/uL — ABNORMAL HIGH (ref 1.7–7.7)
Neutrophils Relative %: 97 %
Platelets: 218 10*3/uL (ref 150–400)
RBC: 3.72 MIL/uL — ABNORMAL LOW (ref 3.87–5.11)
RDW: 17.8 % — ABNORMAL HIGH (ref 11.5–15.5)
WBC: 80 10*3/uL (ref 4.0–10.5)
nRBC: 0 % (ref 0.0–0.2)

## 2022-07-25 MED ORDER — KETOROLAC TROMETHAMINE 15 MG/ML IJ SOLN
15.0000 mg | Freq: Three times a day (TID) | INTRAMUSCULAR | Status: DC | PRN
  Administered 2022-07-25: 15 mg via INTRAVENOUS
  Filled 2022-07-25: qty 1

## 2022-07-25 MED ORDER — BUTALBITAL-APAP-CAFFEINE 50-325-40 MG PO TABS
1.0000 | ORAL_TABLET | ORAL | Status: DC | PRN
  Administered 2022-07-25: 1 via ORAL
  Filled 2022-07-25: qty 1

## 2022-07-25 MED ORDER — HYDROMORPHONE HCL 1 MG/ML IJ SOLN
0.5000 mg | Freq: Once | INTRAMUSCULAR | Status: AC
  Administered 2022-07-25: 0.5 mg via INTRAVENOUS
  Filled 2022-07-25: qty 0.5

## 2022-07-25 MED ORDER — HYDRALAZINE HCL 20 MG/ML IJ SOLN: 10.0000 mg | Freq: Four times a day (QID) | INTRAMUSCULAR | Status: AC | PRN

## 2022-07-25 MED ORDER — BUTALBITAL-APAP-CAFFEINE 50-325-40 MG PO TABS
1.0000 | ORAL_TABLET | Freq: Once | ORAL | Status: AC
  Administered 2022-07-25: 1 via ORAL
  Filled 2022-07-25: qty 1

## 2022-07-25 MED ORDER — HYDROCODONE-ACETAMINOPHEN 5-325 MG PO TABS
1.0000 | ORAL_TABLET | Freq: Four times a day (QID) | ORAL | Status: AC | PRN
  Administered 2022-07-25: 1 via ORAL
  Filled 2022-07-25: qty 1

## 2022-07-25 MED ORDER — HYDRALAZINE HCL 20 MG/ML IJ SOLN
5.0000 mg | INTRAMUSCULAR | Status: DC | PRN
  Administered 2022-07-25: 5 mg via INTRAVENOUS
  Filled 2022-07-25: qty 1

## 2022-07-25 MED ORDER — HYDRALAZINE HCL 20 MG/ML IJ SOLN
INTRAMUSCULAR | Status: AC
  Filled 2022-07-25: qty 1

## 2022-07-25 MED ORDER — HYDRALAZINE HCL 20 MG/ML IJ SOLN
10.0000 mg | INTRAMUSCULAR | Status: AC | PRN
  Administered 2022-07-25 (×2): 10 mg via INTRAVENOUS
  Filled 2022-07-25: qty 1

## 2022-07-25 MED ORDER — POTASSIUM CHLORIDE 20 MEQ PO PACK
40.0000 meq | PACK | Freq: Two times a day (BID) | ORAL | Status: AC
  Administered 2022-07-25 (×2): 40 meq via ORAL
  Filled 2022-07-25 (×2): qty 2

## 2022-07-25 MED ORDER — HYDRALAZINE HCL 20 MG/ML IJ SOLN
5.0000 mg | Freq: Once | INTRAMUSCULAR | Status: AC
  Administered 2022-07-25: 5 mg via INTRAVENOUS

## 2022-07-25 NOTE — Progress Notes (Signed)
Upon arrival to department, patients BP assessed at 171/119, no c/o pain, nausea, vomiting or anxiety.  Pt states she feels "off", as in unsteady when moving or standing.  Liana Crocker NP made aware, orders received and implemented, will continue to monitor.

## 2022-07-25 NOTE — Plan of Care (Signed)

## 2022-07-25 NOTE — Plan of Care (Signed)
  Problem: Education: Goal: Knowledge of General Education information will improve Description: Including pain rating scale, medication(s)/side effects and non-pharmacologic comfort measures Outcome: Progressing   Problem: Nutrition: Goal: Adequate nutrition will be maintained Outcome: Progressing   Problem: Elimination: Goal: Will not experience complications related to bowel motility Outcome: Progressing Note: Pt has had multiple BMs today after enema was administered.    Problem: Pain Managment: Goal: General experience of comfort will improve Outcome: Progressing

## 2022-07-25 NOTE — Progress Notes (Signed)
Pt c/o 10/10 headache not relieved by tylenol.  Liana Crocker NP made aware and orders received.  Pt refuses smog enema this am until HA relief, will continue to monitor.

## 2022-07-25 NOTE — Progress Notes (Signed)
[facility] PROGRESS NOTE    Progress Note  April [name]  ZOX:[id number] DOB: May 29, 1975 DOA: 07/24/2022 PCP: Tollie Eth, NP     Brief Narrative:   April [name] is an 47 y.o. 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 She tolerated clear liquid diet she would like to advance to soft diet.   Significant leukocytosis: Previous physician spoke with the oncologist and discussed the case due to a white blood cell count of 80,000, the oncology relates she just received G-CSF injection after chemotherapy there is probably a robust response no evidence of infection. Blood cultures were obtained She has remained afebrile.  Hypokalemia: Likely due to vomiting slowly trending up. Continue potassium supplementation recheck in the morning.  Stage IV colorectal cancer apnea carcinomatosis: Follow-up with oncology as an outpatient.  Prolonged QTc: Likely due to hypokalemia repeating, mag was 2.1.   DVT prophylaxis: lovenox Family Communication:none Status is: Observation The patient remains OBS appropriate and will d/c before 2 midnights.    Code Status:     Code Status Orders  (From admission, onward)           Start     Ordered   07/24/22 2306  Full code  Continuous       Question:  By:  Answer:  Consent: discussion documented in EHR   07/24/22 2306           Code Status History     Date Active Date Inactive Code Status Order ID Comments User Context    04/16/2022 1708 04/18/2022 1606 Full Code [id number]  Glade Lloyd, MD ED   03/21/2022 1907 03/28/2022 0016 Full Code [id number]  Anselm Jungling, DO Inpatient   10/23/2020 0010 10/25/2020 1524 Full Code [id number]  Briscoe Deutscher, MD Inpatient   01/07/2018 1206 01/10/2018 1520 Full Code [id number]  Bobette Mo, MD Inpatient         IV Access:   Peripheral IV   Procedures and diagnostic studies:   CT ABDOMEN PELVIS W CONTRAST  Result Date: 07/24/2022 CLINICAL DATA:  Abdominal pain.  Metastatic colon cancer. EXAM: CT ABDOMEN AND PELVIS WITH CONTRAST TECHNIQUE: Multidetector CT imaging of the abdomen and pelvis was performed using the standard protocol following bolus administration of intravenous contrast. RADIATION DOSE REDUCTION: This exam was performed according to the departmental dose-optimization program which includes automated exposure control, adjustment of the mA and/or kV according to patient size and/or use of iterative reconstruction technique. CONTRAST:  OMNIPAQUE IOHEXOL 300 MG/ML  SOLN COMPARISON:  CT dated 06/25/2022. FINDINGS: Lower chest: Minimal bibasilar atelectasis. The visualized lung bases are otherwise clear. No intra-abdominal free air. Hepatobiliary: The liver is unremarkable. Relatively similar or minimally improved biliary dilatation. Similar positioning of 2 common bile duct stent. There is infiltration of the fat in the periportal fat in the hepatic hilum. There is a small pneumobilia. High attenuating content within the gallbladder upon likely vicarious excretion of contrast. Pancreas: The pancreas is unremarkable. Spleen: Normal in size without focal abnormality. Adrenals/Urinary Tract: The adrenal glands are unremarkable. Nonobstructing bilateral renal calculi measure 1 cm  in the upper pole of the left kidney. There is a 1.5 cm left renal interpolar cyst. There is no hydronephrosis on either side. The visualized ureters and urinary bladder appear unremarkable.  Stomach/Bowel: There is loss of fat plane between the sigmoid colon and the uterus (60/3 and coronal 108/9) which may represent adhesions. Underlying colonic mass is not excluded. Cannot be evaluated on this CT. There is no bowel obstruction or active inflammation. There is moderate stool throughout the colon. The appendix is normal. Vascular/Lymphatic: Mild aortoiliac atherosclerotic disease. The IVC is unremarkable. No portal venous gas. There is no adenopathy. Reproductive: The uterus is poorly visualized but grossly unremarkable. No adnexal masses. Other: Scattered omental nodularity similar to prior CT. There is a small loculated fluid in the left posterior pelvis similar to prior CT. Musculoskeletal: Bilateral L5 pars defects with grade 1 anterolisthesis. No acute osseous pathology. IMPRESSION: 1. No acute intra-abdominal or pelvic pathology. No bowel obstruction. Normal appendix. 2. Similar or minimally improved biliary dilatation with similar positioning of 2 common bile duct stents. 3. Infiltration of the periportal fat at the hepatic hilum similar to prior CT. 4. Nonobstructing bilateral renal calculi. No hydronephrosis. 5. No significant interval change in omental nodularity. 6. Loss of fat plane between the sigmoid colon and uterus, likely adhesion. Underlying mass is not excluded. 7.  Aortic Atherosclerosis (ICD10-I70.0). Electronically Signed   By: Elgie Collard M.D.   On: 07/24/2022 21:15   DG Chest Portable 1 View  Result Date: 07/24/2022 CLINICAL DATA:  Abdominal pain, colon cancer. EXAM: PORTABLE CHEST 1 VIEW COMPARISON:  Chest x-ray 05/26/2022 FINDINGS: Left upper extremity PICC terminates in the SVC, unchanged. Heart is borderline enlarged. Mediastinal silhouette is stable. Both lungs are clear. The visualized skeletal structures are unremarkable. IMPRESSION: No active disease. Electronically Signed   By: Darliss Cheney M.D.   On: 07/24/2022 20:50     Medical Consultants:    None.   Subjective:    April [name] feels better.  No further vomiting tolerating her diet  Objective:    Vitals:   07/25/22 0118 07/25/22 0214 07/25/22 0647 07/25/22 0714  BP: (!) 158/113 124/87 (!) 146/106 (!) 166/119  Pulse: 98 98 96 87  Resp:   18 18  Temp:  98.4 F (36.9 C) 99 F (37.2 C) 98 F (36.7 C)  TempSrc:   Oral Oral  SpO2:   99% 100%  Weight:      Height:       SpO2: 100 %  No intake or output data in the 24 hours ending 07/25/22 0804 Filed Weights   07/24/22 1750  Weight: 68.9 kg    Exam: General exam: In no acute distress. Respiratory system: Good air movement and clear to auscultation. Cardiovascular system: S1 & S2 heard, RRR. No JVD. Gastrointestinal system: Abdomen is nondistended, soft and nontender.  Extremities: No pedal edema. Skin: No rashes, lesions or ulcers Psychiatry: Judgement and insight appear normal. Mood & affect appropriate.    Data Reviewed:    Labs: Basic Metabolic Panel: Recent Labs  Lab 07/21/22 0844 07/24/22 1855 07/25/22 0530  NA 140 133* 134*  K 3.9 2.8* 3.0*  CL 107 99 102  CO2 28 25 23   GLUCOSE 93 91 94  BUN 11 12 8   CREATININE 0.52 0.48 0.46  CALCIUM 9.2 8.7* 8.7*  MG  --  2.1  --    GFR Estimated Creatinine Clearance: 82 mL/min (by C-G formula based on SCr of 0.46 mg/dL). Liver Function Tests: Recent  Labs  Lab 07/21/22 0844 07/24/22 1855 07/25/22 0530  AST 14* 24 22  ALT 10 20 19   ALKPHOS 131* 145* 143*  BILITOT 0.2* 0.6 0.6  PROT 7.3 8.2* 7.0  ALBUMIN 4.1 4.0 3.6   Recent Labs  Lab 07/24/22 1855  LIPASE 186*   No results for input(s): "AMMONIA" in the last 168 hours. Coagulation profile No results for input(s): "INR", "PROTIME" in the last 168 hours. COVID-19 Labs  No results for input(s): "DDIMER", "FERRITIN", "LDH", "CRP" in the last 72 hours.  Lab Results  Component Value Date   SARSCOV2NAA NEGATIVE 10/22/2020    CBC: Recent Labs  Lab 07/21/22 0844  07/24/22 1855 07/25/22 0530  WBC 6.3 79.3* 80.0*  NEUTROABS 2.7 71.4* 77.6*  HGB 11.5* 12.1 11.6*  HCT 35.2* 36.9 35.6*  MCV 94.9 94.4 95.7  PLT 214 222 218   Cardiac Enzymes: No results for input(s): "CKTOTAL", "CKMB", "CKMBINDEX", "TROPONINI" in the last 168 hours. BNP (last 3 results) No results for input(s): "PROBNP" in the last 8760 hours. CBG: No results for input(s): "GLUCAP" in the last 168 hours. D-Dimer: No results for input(s): "DDIMER" in the last 72 hours. Hgb A1c: No results for input(s): "HGBA1C" in the last 72 hours. Lipid Profile: No results for input(s): "CHOL", "HDL", "LDLCALC", "TRIG", "CHOLHDL", "LDLDIRECT" in the last 72 hours. Thyroid function studies: No results for input(s): "TSH", "T4TOTAL", "T3FREE", "THYROIDAB" in the last 72 hours.  Invalid input(s): "FREET3" Anemia work up: No results for input(s): "VITAMINB12", "FOLATE", "FERRITIN", "TIBC", "IRON", "RETICCTPCT" in the last 72 hours. Sepsis Labs: Recent Labs  Lab 07/21/22 0844 07/24/22 1855 07/24/22 2040 07/25/22 0530  WBC 6.3 79.3*  --  80.0*  LATICACIDVEN  --   --  1.3  --    Microbiology No results found for this or any previous visit (from the past 240 hour(s)).   Medications:    amLODipine  5 mg Oral Daily   heparin  5,000 Units Subcutaneous Q8H   lactulose  20 g Oral BID   melatonin  5 mg Oral QHS   pantoprazole  40 mg Oral Daily   senna-docusate  1 tablet Oral BID   sorbitol, milk of mag, mineral oil, glycerin (SMOG) enema  200 mL Rectal Once   Continuous Infusions:  sodium chloride 125 mL/hr at 07/24/22 1840   0.9 % NaCl with KCl 20 mEq / L 125 mL/hr at 07/25/22 0027      LOS: 0 days   Marinda Elk  [facility]  07/25/2022, 8:04 AM

## 2022-07-26 DIAGNOSIS — D72829 Elevated white blood cell count, unspecified: Secondary | ICD-10-CM | POA: Diagnosis not present

## 2022-07-26 DIAGNOSIS — R112 Nausea with vomiting, unspecified: Secondary | ICD-10-CM | POA: Diagnosis not present

## 2022-07-26 DIAGNOSIS — C189 Malignant neoplasm of colon, unspecified: Secondary | ICD-10-CM | POA: Diagnosis not present

## 2022-07-26 LAB — BASIC METABOLIC PANEL
Anion gap: 7 (ref 5–15)
BUN: 6 mg/dL (ref 6–20)
CO2: 25 mmol/L (ref 22–32)
Calcium: 8.7 mg/dL — ABNORMAL LOW (ref 8.9–10.3)
Chloride: 103 mmol/L (ref 98–111)
Creatinine, Ser: 0.55 mg/dL (ref 0.44–1.00)
GFR, Estimated: >60 mL/min (ref >60)
Glucose, Bld: 80 mg/dL (ref 70–99)
Potassium: 3.4 mmol/L — ABNORMAL LOW (ref 3.5–5.1)
Sodium: 135 mmol/L (ref 135–145)

## 2022-07-26 MED ORDER — AMLODIPINE BESYLATE-VALSARTAN 10-320 MG PO TABS: 1.0000 | ORAL_TABLET | Freq: Every day | ORAL | Status: DC

## 2022-07-26 MED ORDER — IRBESARTAN 300 MG PO TABS
300.0000 mg | ORAL_TABLET | Freq: Every day | ORAL | Status: DC
  Filled 2022-07-26: qty 1

## 2022-07-26 MED ORDER — AMLODIPINE BESYLATE 5 MG PO TABS
5.0000 mg | ORAL_TABLET | Freq: Every day | ORAL | Status: DC
  Administered 2022-07-26: 5 mg via ORAL
  Filled 2022-07-26: qty 1

## 2022-07-26 MED ORDER — POTASSIUM CHLORIDE 20 MEQ PO PACK
40.0000 meq | PACK | Freq: Two times a day (BID) | ORAL | Status: DC
  Administered 2022-07-26: 40 meq via ORAL
  Filled 2022-07-26: qty 2

## 2022-07-26 MED ORDER — HEPARIN SOD (PORK) LOCK FLUSH 100 UNIT/ML IV SOLN
500.0000 [IU] | Freq: Once | INTRAVENOUS | Status: AC
  Administered 2022-07-26: 500 [IU] via INTRAVENOUS
  Filled 2022-07-26: qty 5

## 2022-07-26 MED ORDER — METOPROLOL SUCCINATE ER 50 MG PO TB24
50.0000 mg | ORAL_TABLET | Freq: Every day | ORAL | Status: DC
  Administered 2022-07-26: 50 mg via ORAL
  Filled 2022-07-26: qty 1

## 2022-07-26 MED ORDER — CHLORTHALIDONE 25 MG PO TABS
25.0000 mg | ORAL_TABLET | Freq: Every day | ORAL | Status: DC
  Administered 2022-07-26: 25 mg via ORAL
  Filled 2022-07-26: qty 1

## 2022-07-26 NOTE — Discharge Summary (Signed)
Physician Discharge Summary  April [name] WUJ:[id number] DOB: 1975-07-05 DOA: 07/24/2022  PCP: Tollie Eth, NP  Admit date: 07/24/2022 Discharge date: 07/26/2022  Admitted From: Home Disposition:  Home Recommendations for Outpatient Follow-up:  Follow up with PCP in 1-2 weeks Please obtain BMP/CBC in one week   Home Health:No Equipment/Devices:None  Discharge Condition:Stable CODE STATUS:Full Diet recommendation: Heart Healthy  Brief/Interim Summary: 47 y.o. 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 on chemotherapy, malignant stricture status post ERCP twice, with multiple episodes of nephrolithiasis resulting in multiple urologic intervention that had chemotherapy about 3 days prior to admission started feeling sick and then started having intractable nausea and vomiting felt weak and lightheaded   Discharge Diagnoses:  Principal Problem:   Intractable nausea and vomiting Active Problems:   Hypokalemia   Prolonged QT interval   Colorectal cancer, stage IV (HCC)   Protein-calorie malnutrition, severe   Nausea and vomiting  Chemotherapy induced intractable nausea and vomiting: Question due to chemotherapy she was started on IV fluids Zofran, after this she was able to tolerate a clear liquid diet which was advanced which she tolerated.  Hypokalemia: It was repleted orally now resolved.  Significant leukocytosis: Likely due to robust response to G- CSF injection after chemotherapy. The she remained afebrile blood cultures remain negative till date.  Stage IV colorectal cancer with peritoneal carcinomatosis: Follow-up with oncology as an outpatient.  Prolonged QTc: Likely due to hypokalemia now improved  Discharge Instructions  Discharge Instructions     Diet - low sodium heart healthy   Complete by: As directed    Increase activity slowly   Complete by: As directed       Allergies as of 07/26/2022        Reactions   Lisinopril Swelling, Other (See Comments)   Lip swelling and Angioedema   Irinotecan Other (See Comments)   Stomach cramping Patient given 0.5 mL atropine IV and was able to continue infusion.  See Progress note on 10/03/20.    Leucovorin Calcium Other (See Comments)   Stomach cramping Patient given 0.5 mL atropine IV and was able to continue infusion. See Progress note on 10/03/20.         Medication List     STOP taking these medications    amLODipine 5 MG tablet Commonly known as: NORVASC       TAKE these medications    amLODipine-valsartan 10-320 MG tablet Commonly known as: EXFORGE Take 1 tablet by mouth daily.   chlorthalidone 25 MG tablet Commonly known as: HYGROTON Take 1 tablet (25 mg total) by mouth daily. What changed:  when to take this reasons to take this   dexamethasone 4 MG tablet Commonly known as: DECADRON Take 1 tablet twice a day for 3 days beginning day 2   HYDROcodone-acetaminophen 10-325 MG tablet Commonly known as: Norco Take 1 tablet by mouth every 6 (six) hours as needed. What changed:  when to take this reasons to take this   metoprolol succinate 50 MG 24 hr tablet Commonly known as: TOPROL-XL Take 50 mg by mouth daily. Take with or immediately following a meal.   ondansetron 4 MG tablet Commonly known as: Zofran Take 1 tablet (4 mg total) by mouth every 8 (eight) hours as needed for nausea or vomiting. What changed: when to take this   pantoprazole 40 MG tablet Commonly known as: PROTONIX Starting on 03/27/2022, take 40 mg by mouth two times a day before meals for 60  days. On Day #61, decrease to 40 mg once a day before a meal. What changed:  how much to take how to take this when to take this reasons to take this additional instructions   potassium chloride 10 MEQ CR capsule Commonly known as: MICRO-K Take 1 capsule (10 mEq total) by mouth 2 (two) times daily. What changed: when to take this    prochlorperazine 10 MG tablet Commonly known as: COMPAZINE Take 1 tablet (10 mg total) by mouth every 6 (six) hours as needed for nausea or vomiting. What changed: when to take this   senna-docusate 8.6-50 MG tablet Commonly known as: Senokot-S Take 1 tablet by mouth 2 (two) times daily.        Allergies  Allergen Reactions   Lisinopril Swelling and Other (See Comments)    Lip swelling and Angioedema   Irinotecan Other (See Comments)    Stomach cramping Patient given 0.5 mL atropine IV and was able to continue infusion.  See Progress note on 10/03/20.    Leucovorin Calcium Other (See Comments)    Stomach cramping Patient given 0.5 mL atropine IV and was able to continue infusion. See Progress note on 10/03/20.     Consultations: None   Procedures/Studies: CT ABDOMEN PELVIS W CONTRAST  Result Date: 07/24/2022 CLINICAL DATA:  Abdominal pain.  Metastatic colon cancer. EXAM: CT ABDOMEN AND PELVIS WITH CONTRAST TECHNIQUE: Multidetector CT imaging of the abdomen and pelvis was performed using the standard protocol following bolus administration of intravenous contrast. RADIATION DOSE REDUCTION: This exam was performed according to the departmental dose-optimization program which includes automated exposure control, adjustment of the mA and/or kV according to patient size and/or use of iterative reconstruction technique. CONTRAST:  OMNIPAQUE IOHEXOL 300 MG/ML  SOLN COMPARISON:  CT dated 06/25/2022. FINDINGS: Lower chest: Minimal bibasilar atelectasis. The visualized lung bases are otherwise clear. No intra-abdominal free air. Hepatobiliary: The liver is unremarkable. Relatively similar or minimally improved biliary dilatation. Similar positioning of 2 common bile duct stent. There is infiltration of the fat in the periportal fat in the hepatic hilum. There is a small pneumobilia. High attenuating content within the gallbladder upon likely vicarious excretion of contrast. Pancreas: The  pancreas is unremarkable. Spleen: Normal in size without focal abnormality. Adrenals/Urinary Tract: The adrenal glands are unremarkable. Nonobstructing bilateral renal calculi measure 1 cm in the upper pole of the left kidney. There is a 1.5 cm left renal interpolar cyst. There is no hydronephrosis on either side. The visualized ureters and urinary bladder appear unremarkable. Stomach/Bowel: There is loss of fat plane between the sigmoid colon and the uterus (60/3 and coronal 108/9) which may represent adhesions. Underlying colonic mass is not excluded. Cannot be evaluated on this CT. There is no bowel obstruction or active inflammation. There is moderate stool throughout the colon. The appendix is normal. Vascular/Lymphatic: Mild aortoiliac atherosclerotic disease. The IVC is unremarkable. No portal venous gas. There is no adenopathy. Reproductive: The uterus is poorly visualized but grossly unremarkable. No adnexal masses. Other: Scattered omental nodularity similar to prior CT. There is a small loculated fluid in the left posterior pelvis similar to prior CT. Musculoskeletal: Bilateral L5 pars defects with grade 1 anterolisthesis. No acute osseous pathology. IMPRESSION: 1. No acute intra-abdominal or pelvic pathology. No bowel obstruction. Normal appendix. 2. Similar or minimally improved biliary dilatation with similar positioning of 2 common bile duct stents. 3. Infiltration of the periportal fat at the hepatic hilum similar to prior CT. 4. Nonobstructing bilateral renal calculi.  No hydronephrosis. 5. No significant interval change in omental nodularity. 6. Loss of fat plane between the sigmoid colon and uterus, likely adhesion. Underlying mass is not excluded. 7.  Aortic Atherosclerosis (ICD10-I70.0). Electronically Signed   By: Elgie Collard M.D.   On: 07/24/2022 21:15   DG Chest Portable 1 View  Result Date: 07/24/2022 CLINICAL DATA:  Abdominal pain, colon cancer. EXAM: PORTABLE CHEST 1 VIEW  COMPARISON:  Chest x-ray 05/26/2022 FINDINGS: Left upper extremity PICC terminates in the SVC, unchanged. Heart is borderline enlarged. Mediastinal silhouette is stable. Both lungs are clear. The visualized skeletal structures are unremarkable. IMPRESSION: No active disease. Electronically Signed   By: Darliss Cheney M.D.   On: 07/24/2022 20:50   (Echo, Carotid, EGD, Colonoscopy, ERCP)    Subjective: No complaints  Discharge Exam: Vitals:   07/25/22 2130 07/26/22 0457  BP: (!) 155/106 (!) 155/110  Pulse: (!) 103 91  Resp: 18   Temp: 98.9 F (37.2 C) 98.4 F (36.9 C)  SpO2: 100% 100%   Vitals:   07/25/22 1223 07/25/22 1500 07/25/22 2130 07/26/22 0457  BP: (!) 180/135 (!) 152/111 (!) 155/106 (!) 155/110  Pulse: (!) 102  (!) 103 91  Resp: 18  18   Temp: 98.4 F (36.9 C)  98.9 F (37.2 C) 98.4 F (36.9 C)  TempSrc: Oral  Oral Oral  SpO2: 100%  100% 100%  Weight:      Height:        General: Pt is alert, awake, not in acute distress Cardiovascular: RRR, S1/S2 +, no rubs, no gallops Respiratory: CTA bilaterally, no wheezing, no rhonchi Abdominal: Soft, NT, ND, bowel sounds + Extremities: no edema, no cyanosis    The results of significant diagnostics from this hospitalization (including imaging, microbiology, ancillary and laboratory) are listed below for reference.     Microbiology: Recent Results (from the past 240 hour(s))  Blood culture (routine x 2)     Status: None (Preliminary result)   Collection Time: 07/24/22  8:40 PM   Specimen: BLOOD  Result Value Ref Range Status   Specimen Description   Final    BLOOD LEFT ANTECUBITAL Performed at [facility], [address], [place], [place] [address]    Special Requests   Final    BOTTLES DRAWN AEROBIC AND ANAEROBIC Blood Culture adequate volume Performed at [facility], [address], [place], [place] [address]    Culture   Final    NO GROWTH 2 DAYS Performed at [facility], [address] [place], [place] [address]    Report Status PENDING  Incomplete  Blood culture (routine x 2)     Status: None (Preliminary result)   Collection Time: 07/24/22  8:45 PM   Specimen: BLOOD  Result Value Ref Range Status   Specimen Description   Final    BLOOD BLOOD RIGHT ARM Performed at [facility], [address] [place], [place] [address]    Special Requests   Final    BOTTLES DRAWN AEROBIC AND ANAEROBIC Blood Culture adequate volume Performed at [facility], [address] [place], [place] [address]    Culture   Final    NO GROWTH 2 DAYS Performed at [facility], [address] [place], [place] [address]    Report Status PENDING  Incomplete     Labs: BNP (last 3 results) No results for input(s): "BNP" in the last 8760 hours. Basic Metabolic Panel: Recent  Labs  Lab 07/21/22 0844 07/24/22 1855 07/25/22 0530 07/26/22 0604  NA 140 133* 134* 135  K 3.9 2.8* 3.0* 3.4*  CL 107 99 102 103  CO2 28 25 23 25   GLUCOSE 93 91 94 80  BUN 11 12 8 6   CREATININE 0.52 0.48 0.46 0.55  CALCIUM 9.2 8.7* 8.7* 8.7*  MG  --  2.1  --   --    Liver Function Tests: Recent Labs  Lab 07/21/22 0844 07/24/22 1855 07/25/22 0530  AST 14* 24 22  ALT 10 20 19   ALKPHOS 131* 145* 143*  BILITOT 0.2* 0.6 0.6  PROT 7.3 8.2* 7.0  ALBUMIN 4.1 4.0 3.6   Recent Labs  Lab 07/24/22 1855  LIPASE 186*   No results for input(s): "AMMONIA" in the last 168 hours. CBC: Recent Labs  Lab 07/21/22 0844 07/24/22 1855 07/25/22 0530  WBC 6.3 79.3* 80.0*  NEUTROABS 2.7 71.4* 77.6*  HGB 11.5* 12.1 11.6*  HCT 35.2* 36.9 35.6*  MCV 94.9 94.4 95.7  PLT 214 222 218   Cardiac Enzymes: No results for input(s): "CKTOTAL", "CKMB", "CKMBINDEX", "TROPONINI" in the last 168 hours. BNP: Invalid input(s): "POCBNP" CBG: No results for input(s): "GLUCAP" in the last 168 hours. D-Dimer No results for input(s): "DDIMER" in the last 72  hours. Hgb A1c No results for input(s): "HGBA1C" in the last 72 hours. Lipid Profile No results for input(s): "CHOL", "HDL", "LDLCALC", "TRIG", "CHOLHDL", "LDLDIRECT" in the last 72 hours. Thyroid function studies No results for input(s): "TSH", "T4TOTAL", "T3FREE", "THYROIDAB" in the last 72 hours.  Invalid input(s): "FREET3" Anemia work up No results for input(s): "VITAMINB12", "FOLATE", "FERRITIN", "TIBC", "IRON", "RETICCTPCT" in the last 72 hours. Urinalysis    Component Value Date/Time   COLORURINE YELLOW 07/24/2022 2000   APPEARANCEUR CLEAR 07/24/2022 2000   LABSPEC 1.026 07/24/2022 2000   PHURINE 5.0 07/24/2022 2000   GLUCOSEU NEGATIVE 07/24/2022 2000   HGBUR NEGATIVE 07/24/2022 2000   BILIRUBINUR NEGATIVE 07/24/2022 2000   KETONESUR 20 (A) 07/24/2022 2000   PROTEINUR 100 (A) 07/24/2022 2000   NITRITE NEGATIVE 07/24/2022 2000   LEUKOCYTESUR NEGATIVE 07/24/2022 2000   Sepsis Labs Recent Labs  Lab 07/21/22 0844 07/24/22 1855 07/25/22 0530  WBC 6.3 79.3* 80.0*   Microbiology Recent Results (from the past 240 hour(s))  Blood culture (routine x 2)     Status: None (Preliminary result)   Collection Time: 07/24/22  8:40 PM   Specimen: BLOOD  Result Value Ref Range Status   Specimen Description   Final    BLOOD LEFT ANTECUBITAL Performed at [facility], [address] [place], [place] [address]    Special Requests   Final    BOTTLES DRAWN AEROBIC AND ANAEROBIC Blood Culture adequate volume Performed at [facility], [address], [place], [place] [address]    Culture   Final    NO GROWTH 2 DAYS Performed at [facility], [address] [place], [place] [address]    Report Status PENDING  Incomplete  Blood culture (routine x 2)     Status: None (Preliminary result)   Collection Time: 07/24/22  8:45 PM   Specimen: BLOOD  Result Value Ref Range Status   Specimen Description   Final    BLOOD BLOOD RIGHT  ARM Performed at [facility], [address], [place], [place] [address]    Special Requests   Final    BOTTLES DRAWN AEROBIC AND ANAEROBIC Blood Culture adequate  volume Performed at [facility], [address] [place], [place] [address]    Culture   Final    NO GROWTH 2 DAYS Performed at [facility], [address], [place], [place] [address]    Report Status PENDING  Incomplete    SIGNED:   Marinda Elk, MD  [facility] 07/26/2022, 9:18 AM Pager   If 7PM-7AM, please contact night-coverage [email or web address] Password [email or web address]

## 2022-07-28 ENCOUNTER — Telehealth: Payer: Self-pay

## 2022-07-28 LAB — CULTURE, BLOOD (ROUTINE X 2): Special Requests: ADEQUATE

## 2022-07-28 NOTE — Transitions of Care (Post Inpatient/ED Visit) (Signed)
07/28/2022  Name: April [name] MRN: [id number] DOB: 1975-07-04  Today's TOC FU Call Status: Today's TOC FU Call Status:: Successful TOC FU Call Competed TOC FU Call Complete Date: 07/28/22  Transition Care Management Follow-up Telephone Call Date of Discharge: 07/26/22 Discharge Facility: [facility]) Type of Discharge: Inpatient Admission Primary Inpatient Discharge Diagnosis:: "N & V,unspeciifed type" How have you been since you were released from the hospital?: Better (Brief call with pt-states she was resting and does not feel up to answering a lot of questions. She reports she is having no pain and no acute issues since returning home.) Any questions or concerns?: No  Items Reviewed: Did you receive and understand the discharge instructions provided?: Yes Medications obtained,verified, and reconciled?: No Reason for Partial Mediation Review: pt did not feel up to med review-discussed med changes with pt-pt states she is going to discuss meds with PCP-as hospital was giving her a lot of meds differently than she normally takes them Any new allergies since your discharge?: No Dietary orders reviewed?: Yes Type of Diet Ordered:: low sodium/heart healthy Do you have support at home?: Yes People in Home: parent(s) Name of Support/Comfort Primary Source: mom  Medications Reviewed Today: Medications Reviewed Today     Reviewed by Rulon Sera, CPhT (Pharmacy Technician) on 07/25/22 at 1111  Med List Status: Complete   Medication Order Taking? Sig Documenting Provider Last Dose Status Informant  acetaminophen (TYLENOL) 500 MG tablet [id number] Yes Take 1,000 mg by mouth as needed for moderate pain. [provider] Past Week Active Self, Pharmacy Records           Med Note (CRUTHIS, CHLOE C   Fri Jul 25, 2022 10:52 AM)    amLODipine (NORVASC) 5 MG tablet [id number] Yes Take 1 tablet (5 mg total) by mouth daily. Tollie Eth, NP 07/24/2022 Active Self, Pharmacy  Records           Med Note (CRUTHIS, CHLOE C   Fri Jul 25, 2022 11:01 AM) Pt is adamant she still has this medication at home and is taking it daily. Pt stated she has an overstock of this medication. Pt reported she threw up a while after she took this medication on 07/24/22   amLODipine-valsartan (EXFORGE) 10-320 MG tablet [id number] No Take 1 tablet by mouth daily.  Patient not taking: Reported on 07/25/2022   [provider] Not Taking Active Self, Pharmacy Records  chlorthalidone (HYGROTON) 25 MG tablet [id number] Yes Take 1 tablet (25 mg total) by mouth daily.  Patient taking differently: Take 25 mg by mouth daily as needed (high BP).   Tollie Eth, NP Past Week Active Self, Pharmacy Records           Med Note Levonne Hubert Jul 21, 2022  9:13 AM)    dexamethasone (DECADRON) 4 MG tablet [id number] Yes Take 1 tablet twice a day for 3 days beginning day 2 Rana Snare, NP 2 weeks Active Self, Pharmacy Records           Med Note (CRUTHIS, Marcy Siren   Fri Jul 25, 2022 11:11 AM) Pt is adamant she still has this medication at home and is taking it when she has her "treatments"   HYDROcodone-acetaminophen (NORCO) 10-325 MG tablet [id number] Yes Take 1 tablet by mouth every 6 (six) hours as needed.  Patient taking differently: Take 1 tablet by mouth as needed for moderate pain or severe pain.   Ladene Artist, MD Past  Week Active Self, Pharmacy Records  lactulose (CHRONULAC) 10 GM/15ML solution [id number] No Take 15 mLs (10 g total) by mouth 2 (two) times daily. Take twice daily until good bowel movement, then decrease to 15 ml once daily  Patient not taking: Reported on 06/24/2022   Ladene Artist, MD Not Taking Active Self, Pharmacy Records           Med Note (CRUTHIS, CHLOE C   Fri Jul 25, 2022 10:52 AM)    metoprolol succinate (TOPROL-XL) 50 MG 24 hr tablet [id number] No Take 50 mg by mouth daily. Take with or immediately following a meal.  Patient not taking: Reported on  07/25/2022   [provider] Not Taking Active Self, Pharmacy Records  Na Sulfate-K Sulfate-Mg Sulf (SUPREP BOWEL PREP KIT) 17.5-3.13-1.6 GM/177ML SOLN [id number] No Take 1 kit by mouth as directed. For colonoscopy prep  Patient not taking: Reported on 06/30/2022   Mansouraty, Netty Starring., MD Not Taking Active Self, Pharmacy Records  ondansetron [facility]) 4 MG tablet [id number] Yes Take 1 tablet (4 mg total) by mouth every 8 (eight) hours as needed for nausea or vomiting.  Patient taking differently: Take 4 mg by mouth as needed for nausea or vomiting.   Glade Lloyd, MD unk Active Self, Pharmacy Records           Med Note (CRUTHIS, CHLOE C   Fri Jul 25, 2022 10:52 AM)    pantoprazole (PROTONIX) 40 MG tablet [id number] Yes Starting on 03/27/2022, take 40 mg by mouth two times a day before meals for 60 days. On Day #61, decrease to 40 mg once a day before a meal.  Patient taking differently: Take 40 mg by mouth daily as needed (heartburn).   Glade Lloyd, MD Past Month Active Self, Pharmacy Records           Med Note (CRUTHIS, CHLOE C   Fri Jul 25, 2022 10:51 AM)    potassium chloride (MICRO-K) 10 MEQ CR capsule [id number] Yes Take 1 capsule (10 mEq total) by mouth 2 (two) times daily.  Patient taking differently: Take 10 mEq by mouth daily.   Ladene Artist, MD Past Week Active Self, Pharmacy Records  prochlorperazine (COMPAZINE) 10 MG tablet [id number] Yes Take 1 tablet (10 mg total) by mouth every 6 (six) hours as needed for nausea or vomiting.  Patient taking differently: Take 10 mg by mouth as needed for nausea or vomiting.   Rana Snare, NP unk Active Self, Pharmacy Records           Med Note (CRUTHIS, CHLOE C   Fri Jul 25, 2022 11:11 AM) Pt is unsure of last dose.   senna-docusate (SENOKOT-S) 8.6-50 MG tablet [id number] No Take 1 tablet by mouth 2 (two) times daily.  Patient not taking: Reported on 07/25/2022   Glade Lloyd, MD Not Taking Active Self, Pharmacy Records            Med Note (CRUTHIS, CHLOE C   Fri Jul 25, 2022 10:51 AM)    trifluridine-tipiracil (LONSURF) 20-8.19 MG tablet [id number] No Take 35 mg/m2 trifluridine by mouth 2 (two) times daily after a meal. Take within 1 hr after AM & PM meals on days 1-5, 8-12. Repeat every 28 days.  Patient not taking: Reported on 07/25/2022   [provider] Not Taking Active Self, Pharmacy Records            Home Care and Equipment/Supplies: Were Home Health Services Ordered?: NA Any new equipment or  medical supplies ordered?: NA  Functional Questionnaire: Do you need assistance with bathing/showering or dressing?: No Do you need assistance with meal preparation?: No Do you need assistance with eating?: No Do you have difficulty maintaining continence: No Do you need assistance with getting out of bed/getting out of a chair/moving?: No Do you have difficulty managing or taking your medications?: No  Follow up appointments reviewed: PCP Follow-up appointment confirmed?: No (Pt declined care guide assistance with making PCP follow up appt during this call-states she is able to call office on her own and will do so later) MD Provider Line Number:[phone] Given: No Specialist Hospital Follow-up appointment confirmed?: Yes Date of Specialist follow-up appointment?: 08/04/22 Follow-Up Specialty Provider:: Lonna Cobb Do you need transportation to your follow-up appointment?: No Do you understand care options if your condition(s) worsen?: Yes-patient verbalized understanding   TOC Interventions Today    Flowsheet Row Most Recent Value  TOC Interventions   TOC Interventions Discussed/Reviewed TOC Interventions Discussed      Interventions Today    Flowsheet Row Most Recent Value  General Interventions   General Interventions Discussed/Reviewed General Interventions Discussed, Doctor Visits  Doctor Visits Discussed/Reviewed Doctor Visits Discussed, Specialist, PCP  PCP/Specialist Visits  Compliance with follow-up visit  Education Interventions   Education Provided Provided Education  Provided Verbal Education On Nutrition, When to see the doctor, Medication, Other  [pain/sx mgmt]  Nutrition Interventions   Nutrition Discussed/Reviewed Nutrition Discussed  Pharmacy Interventions   Pharmacy Dicussed/Reviewed Pharmacy Topics Discussed, Medications and their functions        Alessandra Grout [facility] Care Management Care Management Community Coordinator Direct Phone: [phone] Toll Free: [phone] Fax: [phone]

## 2022-07-29 LAB — CULTURE, BLOOD (ROUTINE X 2)
Culture: NO GROWTH
Culture: NO GROWTH
Special Requests: ADEQUATE

## 2022-07-30 ENCOUNTER — Inpatient Hospital Stay: Payer: 59

## 2022-07-30 VITALS — BP 148/98 | HR 106 | Temp 98.1°F | Resp 18

## 2022-07-30 DIAGNOSIS — Z5111 Encounter for antineoplastic chemotherapy: Secondary | ICD-10-CM | POA: Diagnosis present

## 2022-07-30 DIAGNOSIS — C786 Secondary malignant neoplasm of retroperitoneum and peritoneum: Secondary | ICD-10-CM | POA: Diagnosis present

## 2022-07-30 DIAGNOSIS — C187 Malignant neoplasm of sigmoid colon: Secondary | ICD-10-CM | POA: Diagnosis present

## 2022-07-30 DIAGNOSIS — Z79899 Other long term (current) drug therapy: Secondary | ICD-10-CM | POA: Diagnosis not present

## 2022-07-30 DIAGNOSIS — Z452 Encounter for adjustment and management of vascular access device: Secondary | ICD-10-CM

## 2022-07-30 MED ORDER — SODIUM CHLORIDE 0.9% FLUSH
10.0000 mL | Freq: Once | INTRAVENOUS | Status: AC
  Administered 2022-07-30: 10 mL via INTRAVENOUS

## 2022-07-30 MED ORDER — HEPARIN SOD (PORK) LOCK FLUSH 100 UNIT/ML IV SOLN
250.0000 [IU] | Freq: Once | INTRAVENOUS | Status: AC
  Administered 2022-07-30: 250 [IU] via INTRAVENOUS

## 2022-08-02 ENCOUNTER — Other Ambulatory Visit: Payer: Self-pay | Admitting: Oncology

## 2022-08-04 ENCOUNTER — Inpatient Hospital Stay: Payer: 59

## 2022-08-04 ENCOUNTER — Other Ambulatory Visit (HOSPITAL_BASED_OUTPATIENT_CLINIC_OR_DEPARTMENT_OTHER): Payer: Self-pay

## 2022-08-04 ENCOUNTER — Encounter: Payer: Self-pay | Admitting: Nurse Practitioner

## 2022-08-04 ENCOUNTER — Inpatient Hospital Stay (HOSPITAL_BASED_OUTPATIENT_CLINIC_OR_DEPARTMENT_OTHER): Payer: 59 | Admitting: Nurse Practitioner

## 2022-08-04 ENCOUNTER — Encounter: Payer: Self-pay | Admitting: *Deleted

## 2022-08-04 VITALS — BP 135/90 | HR 76 | Temp 98.3°F | Resp 18

## 2022-08-04 VITALS — BP 142/90 | HR 99 | Temp 98.2°F | Resp 18 | Ht 64.0 in | Wt 151.3 lb

## 2022-08-04 DIAGNOSIS — C19 Malignant neoplasm of rectosigmoid junction: Secondary | ICD-10-CM | POA: Diagnosis not present

## 2022-08-04 DIAGNOSIS — Z5111 Encounter for antineoplastic chemotherapy: Secondary | ICD-10-CM | POA: Diagnosis not present

## 2022-08-04 LAB — CMP (CANCER CENTER ONLY)
ALT: 7 U/L (ref 0–44)
AST: 13 U/L — ABNORMAL LOW (ref 15–41)
Albumin: 3.9 g/dL (ref 3.5–5.0)
Alkaline Phosphatase: 143 U/L — ABNORMAL HIGH (ref 38–126)
Anion gap: 7 (ref 5–15)
BUN: 8 mg/dL (ref 6–20)
CO2: 30 mmol/L (ref 22–32)
Calcium: 9.2 mg/dL (ref 8.9–10.3)
Chloride: 104 mmol/L (ref 98–111)
Creatinine: 0.47 mg/dL (ref 0.44–1.00)
GFR, Estimated: >60 mL/min (ref >60)
Glucose, Bld: 84 mg/dL (ref 70–99)
Potassium: 3.3 mmol/L — ABNORMAL LOW (ref 3.5–5.1)
Sodium: 141 mmol/L (ref 135–145)
Total Bilirubin: 0.2 mg/dL — ABNORMAL LOW (ref 0.3–1.2)
Total Protein: 7.8 g/dL (ref 6.5–8.1)

## 2022-08-04 LAB — CBC WITH DIFFERENTIAL (CANCER CENTER ONLY)
Abs Immature Granulocytes: 0.09 10*3/uL — ABNORMAL HIGH (ref 0.00–0.07)
Basophils Absolute: 0.1 10*3/uL (ref 0.0–0.1)
Basophils Relative: 1 %
Eosinophils Absolute: 0.6 10*3/uL — ABNORMAL HIGH (ref 0.0–0.5)
Eosinophils Relative: 6 %
HCT: 33.5 % — ABNORMAL LOW (ref 36.0–46.0)
Hemoglobin: 11.1 g/dL — ABNORMAL LOW (ref 12.0–15.0)
Immature Granulocytes: 1 %
Lymphocytes Relative: 22 %
Lymphs Abs: 2.4 10*3/uL (ref 0.7–4.0)
MCH: 31.4 pg (ref 26.0–34.0)
MCHC: 33.1 g/dL (ref 30.0–36.0)
MCV: 94.9 fL (ref 80.0–100.0)
Monocytes Absolute: 0.7 10*3/uL (ref 0.1–1.0)
Monocytes Relative: 7 %
Neutro Abs: 6.7 10*3/uL (ref 1.7–7.7)
Neutrophils Relative %: 63 %
Platelet Count: 248 10*3/uL (ref 150–400)
RBC: 3.53 MIL/uL — ABNORMAL LOW (ref 3.87–5.11)
RDW: 17.3 % — ABNORMAL HIGH (ref 11.5–15.5)
WBC Count: 10.6 10*3/uL — ABNORMAL HIGH (ref 4.0–10.5)
nRBC: 0 % (ref 0.0–0.2)

## 2022-08-04 LAB — CEA (ACCESS): CEA (CHCC): 2.25 ng/mL (ref 0.00–5.00)

## 2022-08-04 MED ORDER — DEXAMETHASONE 4 MG PO TABS
ORAL_TABLET | ORAL | 2 refills | Status: DC
Start: 2022-08-04 — End: 2022-12-18
  Filled 2022-08-04: qty 12, 3d supply, fill #0

## 2022-08-04 MED ORDER — DEXTROSE 5 % IV SOLN: Freq: Once | INTRAVENOUS | Status: DC

## 2022-08-04 MED ORDER — HEPARIN SOD (PORK) LOCK FLUSH 100 UNIT/ML IV SOLN: 500.0000 [IU] | Freq: Once | INTRAVENOUS | Status: DC | PRN

## 2022-08-04 MED ORDER — SODIUM CHLORIDE 0.9 % IV SOLN
3100.0000 mg | INTRAVENOUS | Status: DC
  Administered 2022-08-04: 3100 mg via INTRAVENOUS
  Filled 2022-08-04: qty 62

## 2022-08-04 MED ORDER — SODIUM CHLORIDE 0.9 % IV SOLN: Freq: Once | INTRAVENOUS | Status: DC | PRN

## 2022-08-04 MED ORDER — OXALIPLATIN CHEMO INJECTION 100 MG/20ML
65.0000 mg/m2 | Freq: Once | INTRAVENOUS | Status: AC
  Administered 2022-08-04: 115 mg via INTRAVENOUS
  Filled 2022-08-04: qty 20

## 2022-08-04 MED ORDER — FAMOTIDINE IN NACL 20-0.9 MG/50ML-% IV SOLN
20.0000 mg | Freq: Once | INTRAVENOUS | Status: AC | PRN
  Administered 2022-08-04: 20 mg via INTRAVENOUS

## 2022-08-04 MED ORDER — DIPHENHYDRAMINE HCL 50 MG/ML IJ SOLN
50.0000 mg | Freq: Once | INTRAMUSCULAR | Status: AC | PRN
  Administered 2022-08-04: 25 mg via INTRAVENOUS

## 2022-08-04 MED ORDER — LEUCOVORIN CALCIUM INJECTION 350 MG
400.0000 mg/m2 | Freq: Once | INTRAVENOUS | Status: AC
  Administered 2022-08-04: 704 mg via INTRAVENOUS
  Filled 2022-08-04: qty 35.2

## 2022-08-04 MED ORDER — DEXTROSE 5 % IV SOLN: Freq: Once | INTRAVENOUS | Status: AC

## 2022-08-04 MED ORDER — SODIUM CHLORIDE 0.9 % IV SOLN
10.0000 mg | Freq: Once | INTRAVENOUS | Status: AC
  Administered 2022-08-04: 10 mg via INTRAVENOUS
  Filled 2022-08-04: qty 10

## 2022-08-04 MED ORDER — PALONOSETRON HCL INJECTION 0.25 MG/5ML
0.2500 mg | Freq: Once | INTRAVENOUS | Status: AC
  Administered 2022-08-04: 0.25 mg via INTRAVENOUS
  Filled 2022-08-04: qty 5

## 2022-08-04 MED ORDER — SODIUM CHLORIDE 0.9 % IV SOLN
150.0000 mg | Freq: Once | INTRAVENOUS | Status: AC
  Administered 2022-08-04: 150 mg via INTRAVENOUS
  Filled 2022-08-04: qty 150

## 2022-08-04 MED ORDER — FLUOROURACIL CHEMO INJECTION 2.5 GM/50ML
400.0000 mg/m2 | Freq: Once | INTRAVENOUS | Status: AC
  Administered 2022-08-04: 700 mg via INTRAVENOUS
  Filled 2022-08-04: qty 14

## 2022-08-04 MED ORDER — SODIUM CHLORIDE 0.9% FLUSH: 10.0000 mL | INTRAVENOUS | Status: DC | PRN

## 2022-08-04 NOTE — Progress Notes (Addendum)
[facility] OFFICE PROGRESS NOTE   Diagnosis: Colon cancer  INTERVAL HISTORY:   April [name] returns as scheduled.  She completed cycle 7 FOLFOX 07/21/2022.  She was hospitalized 07/24/2022 through 07/26/2022 with intractable nausea/vomiting.  She received IV fluids and Zofran and was able to tolerate a diet prior to discharge.  No nausea or vomiting at present.  No mouth sores.  No diarrhea.  Cold sensitivity lasted about a week.  No persistent neuropathy symptoms.  No signs of allergic reaction following chemotherapy.  Objective:  Vital signs in last 24 hours:  Blood pressure (!) 143/89, pulse 99, temperature 98.2 F (36.8 C), temperature source Oral, resp. rate 18, height 5\' 4"  (1.626 m), weight 151 lb 4.8 oz (68.6 kg), last menstrual period 01/07/2006, SpO2 100%.    HEENT: No thrush or ulcers. Resp: Lungs clear bilaterally. Cardio: Regular rate and rhythm. GI: No hepatosplenomegaly.  No mass. Vascular: No leg edema. Neuro: Vibratory sense intact over the fingertips per tuning fork exam. Skin: Palms without erythema. PICC without erythema.  Lab Results:  Lab Results  Component Value Date   WBC 80.0 (HH) 07/25/2022   HGB 11.6 (L) 07/25/2022   HCT 35.6 (L) 07/25/2022   MCV 95.7 07/25/2022   PLT 218 07/25/2022   NEUTROABS 77.6 (H) 07/25/2022    Imaging:  No results found.  Medications: I have reviewed the patient's current medications.  Assessment/Plan: Sigmoid colon cancer, stage IV (pT4a,pN2b,M1c) Colonoscopy 03/24/2019-3 rectal polyps-hyperplastic polyps, distal colon biopsy-at least intramucosal adenocarcinoma, completely obstructing mass in the distal sigmoid colon, could not be traversed, intact mismatch repair protein expression 03/24/2019-CEA 56.1 03/29/2019 CT abdomen/pelvis-circumferential thickening involving the entire mid and distal sigmoid colon to the level of the rectosigmoid junction, solid/cystic mass in the left ovary, bilateral  nephrolithiasis Robotic assisted low anterior resection, mesenteric lymphadenectomy, bilateral salpingo-oophorectomy 04/08/2019 Pathology ([facility] review of outside pathology) metastatic adenocarcinoma involving the peritoneum overlying the round ligament, serosa of the urinary bladder, serosa of the right ureter a sacral area, pelvic peritoneum, and left ovary.  Omental biopsy with focal mucin pools with no carcinoma cells identified, right hemidiaphragm biopsy involved by metastatic adenocarcinoma, invasive adenocarcinoma the sigmoid colon, moderately differentiated, T4a, perineural and vascular invasion present, 7/22 lymph nodes, multiple tumor deposits, resection margins negative Negative for PD-L1, low probability of MSI-high, HER-2 negative, negative for BRAF, NRAS and KRAS alterations CTs 05/19/2019-no evidence of metastatic disease, findings suspicious for colitis of the transverse and ascending colon, small amount of ascites in the cul-de-sac, bilateral renal calculi Cycle 1 FOLFIRI 05/24/2019, bevacizumab added with cycle 2 Cycle 4 FOLFIRI/bevacizumab 07/05/2019 Cycle 5 FOLFIRI 07/27/2019, bevacizumab held secondary to hypertension Cycle 6 FOLFIRI 08/10/1999, bevacizumab held secondary to hypertension CTs 08/30/2019-no evidence of recurrent disease, new subsolid right lower lobe nodule felt to be inflammatory, emphysema Cycle 7 FOLFIRI 09/26/2019, bevacizumab remains on hold secondary to hypertension Cycle 8 FOLFIRI 10/17/2019, bevacizumab held secondary to hypertension, Udenyca added for neutropenia Cycle 9 FOLFIRI 11/07/2019, bevacizumab held secondary to hypertension, Udenyca Cycle 10 FOLFIRI 11/28/2019, bevacizumab held, Udenyca Cycle 11 FOLFIRI 12/26/2019, bevacizumab held, Udenyca CTs 01/25/2020-no evidence of recurrent disease, resolution of right lower lobe nodule Maintenance Xeloda beginning 02/06/2020 CTs 04/25/2020- no evidence of metastatic disease, multiple bilateral renal calculi without  hydronephrosis Maintenance Xeloda continued CT abdomen/pelvis without contrast 08/10/2020-bilateral staghorn renal calculi, no evidence of metastatic disease; addendum 08/23/2020-small but increasing omental nodules. Cycle 1 FOLFIRI/panitumumab 09/19/2020 Cycle 2 FOLFIRI/Panitumumab 10/03/2020 Cycle 3 FOLFIRI/Panitumumab 10/17/2020 Cycle 4 FOLFIRI/panitumumab 10/31/2020 Cycle 5 FOLFIRI/Panitumumab  11/14/2020 CTs 11/26/2020-stable omental metastases compared to 10/22/2020, mildly decreased from 08/10/2020.  Left lower quadrant tiny paracolic gutter implant slightly decreased from CT 08/10/2020.  No new or progressive metastatic disease in the abdomen or pelvis. Cycle 6 FOLFIRI/Panitumumab 11/28/2020, irinotecan dose reduced, treatment schedule adjusted to every 3 weeks going forward Cycle 7 FOLFIRI/panitumumab 12/24/2020 Cycle 8 FOLFIRI/Panitumumab 01/16/2021--treatment held due to hypertension in the infusion area. Cycle 8 FOLFIRI/panitumumab 01/21/2021 Cycle 9 FOLFIRI/panitumumab 02/11/2021 Cycle 10 FOLFIRI/Panitumumab 03/04/2021 Cycle 11 FOLFIRI/Panitumumab 03/25/2021 CT abdomen/pelvis 04/10/2021-stable right omental and left posterior paracolic gutter nodules Cycle 12 FOLFIRI/panitumumab 04/15/2021 Cycle 13 FOLFIRI/panitumumab 05/06/2021 Cycle 14 FOLFIRI/Panitumumab 05/27/2021 Cycle 15 FOLFIRI/Panitumumab 06/17/2021 CT abdomen/pelvis 07/05/2021-slight enlargement of a dominant right omental implant, no new implants Patient requested a treatment break CT abdomen/pelvis 10/04/2021-mild increase in size of multiple omental soft tissue nodules, 4 mm calculus in the distal left ureter adjacent to the ureteral stent Cycle 1 Lonsurf 10/14/2021 10/28/2021 Avastin every 2 weeks Cycle 2 Lonsurf 11/11/2021 Cycle 3 Lonsurf 12/09/2021 Cycle 4 Lonsurf 01/06/2022 Avastin held 01/20/2022 due to proteinuria, 24-hour urine 43 mg CT/pelvis 01/30/2022-possible new peritoneal implant at the transverse colon, no significant change in  other omental/peritoneal implants, stable chronic rectal wall thickening, status post removal of double-J left ureteral stent with no evidence of hydronephrosis or ureteral calculus, nonobstructing bilateral renal calculi Cycle 5 Lonsurf 02/05/2022 or 02/06/2022 Avastin every 2 weeks Cycle 6 Lonsurf 03/03/2022 CTs 04/05/2022-increase in size and number of peritoneal implants compared to January 2024 central liver mass narrowing the anterior branch of the right portal vein, decreased biliary duct dilation, biliary stents in place, stable right cardiophrenic lymph nodes, separate pigtail stent in the lumen of the proximal duodenum Cycle 1 FOLFOX 04/14/2022 CT abdomen/pelvis 04/16/2022-stent within the third portion of the duodenum, stable peritoneal nodules, indwelling biliary stents with mild left biliary duct dilation Cycle 2 FOLFOX 04/28/2022, Emend and prophylactic dexamethasone added Cycle 3 FOLFOX 05/12/2022, Aloxi, Emend, prophylactic dexamethasone, Compazine and lorazepam as needed Chemotherapy held 05/25/2020 due to severe hypertension, evaluated in the emergency department Cycle 4 FOLFOX 06/03/2022 Cycle 5 FOLFOX 06/16/2022, oxaliplatin dose reduced and 5-FU bolus eliminated due to neutropenia CT 06/25/2022-unchanged soft tissue at the hepatic hilum, unchanged, bile duct stent, decreased size of peritoneal and omental nodules and diminished volume of likely fluid in the lower left pelvis Cycle 6 FOLFOX 06/30/2022 Cycle 7 FOLFOX 07/21/2022 Cycle 8 FOLFOX 08/04/2022   Hypertension G4 P3, twins Kidney stones-bilateral staghorn renal calculi on CT 08/10/2020 Infected Port-A-Cath 09/12/2019-placed on Augmentin, referred for Port-A-Cath removal; Port-A-Cath removed 09/14/2019; PICC line placed 09/14/2019; culture staph aureus.  Course of Septra completed. Neutropenia secondary to chemotherapy-Udenyca added with cycle 8 FOLFIRI Right nephrostomy tube 09/12/2020; stent placed 10/09/2020, percutaneous nephrostolithotomy  treatment of right-sided kidney stones 10/09/2020, malpositioned right ureter stent replacement 10/23/2020, right ureter stent removed 10/29/2020 Percutaneous left nephrostolithotomy 10/01/2021-no renal stone identified, left ureter stent left in place Cystoscopy 10/14/2021-stent removed 8.  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  Admission 04/16/2022 with nausea/vomiting and abdominal pain, felt to be acute toxicity related to chemotherapy, discharged home 04/18/2022    Disposition: April [name] appears  stable.  She has completed 7 cycles of FOLFOX.  She required admission following cycle 7 for nausea/vomiting.  Symptoms quickly improved.  Plan to proceed with cycle 8 today as scheduled.  We will make arrangements for additional IV fluids on day of pump discontinuation.  CBC reviewed.  Counts adequate to proceed with treatment.  Chemistry panel is pending.  She will return for follow-up in the next cycle of chemotherapy in 2 weeks.  She will contact the office in the interim with any problems.      Lonna Cobb ANP/GNP-BC   08/04/2022  9:24 AM   Addendum 1443-near end of Oxaliplatin infusion developed pruritus on both palms. No other complaints. Vitals stable. No rash. Benadryl and pepcid given. Symptoms resolved. Oxaliplatin not resumed. Remainder of regimen completed.

## 2022-08-04 NOTE — Patient Instructions (Signed)
[facility]   The chemotherapy medication bag should finish at 46 hours, 96 hours, or 7 days. For example, if your pump is scheduled for 46 hours and it was put on at 4:00 p.m., it should finish at 2:00 p.m. the day it is scheduled to come off regardless of your appointment time.     Estimated time to finish at 12:00pm Wednesday July 24th , 2024.   If the display on your pump reads "Low Volume" and it is beeping, take the batteries out of the pump and come to the cancer center for it to be taken off.   If the pump alarms go off prior to the pump reading "Low Volume" then call [phone] and someone can assist you.  If the plunger comes out and the chemotherapy medication is leaking out, please use your home chemo spill kit to clean up the spill. Do NOT use paper towels or other household products.  If you have problems or questions regarding your pump, please call either [phone] (24 hours a day) or the cancer center Monday-Friday 8:00 a.m.- 4:30 p.m. at the clinic number and we will assist you. If you are unable to get assistance, then go to the nearest Emergency Department and ask the staff to contact the IV team for assistance.  Discharge Instructions: Thank you for choosing [facility] to provide your oncology and hematology care.   If you have a lab appointment with the Cancer Center, please go directly to the Cancer Center and check in at the registration area.   Wear comfortable clothing and clothing appropriate for easy access to any Portacath or PICC line.   We strive to give you quality time with your provider. You may need to reschedule your appointment if you arrive late (15 or more minutes).  Arriving late affects you and other patients whose appointments are after yours.  Also, if you miss three or more appointments without notifying the office, you may be dismissed from the clinic at the provider's discretion.      For  prescription refill requests, have your pharmacy contact our office and allow 72 hours for refills to be completed.    Today you received the following chemotherapy and/or immunotherapy agents Oxaliplatin, Leucovorin, Fluorouracil.      To help prevent nausea and vomiting after your treatment, we encourage you to take your nausea medication as directed.  BELOW ARE SYMPTOMS THAT SHOULD BE REPORTED IMMEDIATELY: *FEVER GREATER THAN 100.4 F (38 C) OR HIGHER *CHILLS OR SWEATING *NAUSEA AND VOMITING THAT IS NOT CONTROLLED WITH YOUR NAUSEA MEDICATION *UNUSUAL SHORTNESS OF BREATH *UNUSUAL BRUISING OR BLEEDING *URINARY PROBLEMS (pain or burning when urinating, or frequent urination) *BOWEL PROBLEMS (unusual diarrhea, constipation, pain near the anus) TENDERNESS IN MOUTH AND THROAT WITH OR WITHOUT PRESENCE OF ULCERS (sore throat, sores in mouth, or a toothache) UNUSUAL RASH, SWELLING OR PAIN  UNUSUAL VAGINAL DISCHARGE OR ITCHING   Items with * indicate a potential emergency and should be followed up as soon as possible or go to the Emergency Department if any problems should occur.  Please show the CHEMOTHERAPY ALERT CARD or IMMUNOTHERAPY ALERT CARD at check-in to the Emergency Department and triage nurse.  Should you have questions after your visit or need to cancel or reschedule your appointment, please contact [facility]  Dept: [phone]  and follow the prompts.  Office hours are 8:00 a.m. to 4:30 p.m. Monday - Friday. Please  note that voicemails left after 4:00 p.m. may not be returned until the following business day.  We are closed weekends and major holidays. You have access to a nurse at all times for urgent questions. Please call the main number to the clinic Dept: [phone] and follow the prompts.   For any non-urgent questions, you may also contact your provider using MyChart. We now offer e-Visits for anyone 54 and older to request care online  for non-urgent symptoms. For details visit [email or web address].   Also download the MyChart app! Go to the app store, search "MyChart", open the app, select [facility], and log in with your MyChart username and password.  Oxaliplatin Injection What is this medication? OXALIPLATIN (ox AL i PLA tin) treats colorectal cancer. It works by slowing down the growth of cancer cells. This medicine may be used for other purposes; ask your health care provider or pharmacist if you have questions. COMMON BRAND NAME(S): Eloxatin What should I tell my care team before I take this medication? They need to know if you have any of these conditions: Heart disease History of irregular heartbeat or rhythm Liver disease Low blood cell levels (white cells, red cells, and platelets) Lung or breathing disease, such as asthma Take medications that treat or prevent blood clots Tingling of the fingers, toes, or other nerve disorder An unusual or allergic reaction to oxaliplatin, other medications, foods, dyes, or preservatives If you or your partner are pregnant or trying to get pregnant Breast-feeding How should I use this medication? This medication is injected into a vein. It is given by your care team in a hospital or clinic setting. Talk to your care team about the use of this medication in children. Special care may be needed. Overdosage: If you think you have taken too much of this medicine contact a poison control center or emergency room at once. NOTE: This medicine is only for you. Do not share this medicine with others. What if I miss a dose? Keep appointments for follow-up doses. It is important not to miss a dose. Call your care team if you are unable to keep an appointment. What may interact with this medication? Do not take this medication with any of the following: Cisapride Dronedarone Pimozide Thioridazine This medication may also interact with the following: Aspirin and aspirin-like  medications Certain medications that treat or prevent blood clots, such as warfarin, apixaban, dabigatran, and rivaroxaban Cisplatin Cyclosporine Diuretics Medications for infection, such as acyclovir, adefovir, amphotericin B, bacitracin, cidofovir, foscarnet, ganciclovir, gentamicin, pentamidine, vancomycin NSAIDs, medications for pain and inflammation, such as ibuprofen or naproxen Other medications that cause heart rhythm changes Pamidronate Zoledronic acid This list may not describe all possible interactions. Give your health care provider a list of all the medicines, herbs, non-prescription drugs, or dietary supplements you use. Also tell them if you smoke, drink alcohol, or use illegal drugs. Some items may interact with your medicine. What should I watch for while using this medication? Your condition will be monitored carefully while you are receiving this medication. You may need blood work while taking this medication. This medication may make you feel generally unwell. This is not uncommon as chemotherapy can affect healthy cells as well as cancer cells. Report any side effects. Continue your course of treatment even though you feel ill unless your care team tells you to stop. This medication may increase your risk of getting an infection. Call your care team for advice if you get a fever, chills, sore throat,  or other symptoms of a cold or flu. Do not treat yourself. Try to avoid being around people who are sick. Avoid taking medications that contain aspirin, acetaminophen, ibuprofen, naproxen, or ketoprofen unless instructed by your care team. These medications may hide a fever. Be careful brushing or flossing your teeth or using a toothpick because you may get an infection or bleed more easily. If you have any dental work done, tell your dentist you are receiving this medication. This medication can make you more sensitive to cold. Do not drink cold drinks or use ice. Cover exposed  skin before coming in contact with cold temperatures or cold objects. When out in cold weather wear warm clothing and cover your mouth and nose to warm the air that goes into your lungs. Tell your care team if you get sensitive to the cold. Talk to your care team if you or your partner are pregnant or think either of you might be pregnant. This medication can cause serious birth defects if taken during pregnancy and for 9 months after the last dose. A negative pregnancy test is required before starting this medication. A reliable form of contraception is recommended while taking this medication and for 9 months after the last dose. Talk to your care team about effective forms of contraception. Do not father a child while taking this medication and for 6 months after the last dose. Use a condom while having sex during this time period. Do not breastfeed while taking this medication and for 3 months after the last dose. This medication may cause infertility. Talk to your care team if you are concerned about your fertility. What side effects may I notice from receiving this medication? Side effects that you should report to your care team as soon as possible: Allergic reactions--skin rash, itching, hives, swelling of the face, lips, tongue, or throat Bleeding--bloody or black, tar-like stools, vomiting blood or brown material that looks like coffee grounds, red or dark brown urine, small red or purple spots on skin, unusual bruising or bleeding Dry cough, shortness of breath or trouble breathing Heart rhythm changes--fast or irregular heartbeat, dizziness, feeling faint or lightheaded, chest pain, trouble breathing Infection--fever, chills, cough, sore throat, wounds that don't heal, pain or trouble when passing urine, general feeling of discomfort or being unwell Liver injury--right upper belly pain, loss of appetite, nausea, light-colored stool, dark yellow or brown urine, yellowing skin or eyes, unusual  weakness or fatigue Low red blood cell level--unusual weakness or fatigue, dizziness, headache, trouble breathing Muscle injury--unusual weakness or fatigue, muscle pain, dark yellow or brown urine, decrease in amount of urine Pain, tingling, or numbness in the hands or feet Sudden and severe headache, confusion, change in vision, seizures, which may be signs of posterior reversible encephalopathy syndrome (PRES) Unusual bruising or bleeding Side effects that usually do not require medical attention (report to your care team if they continue or are bothersome): Diarrhea Nausea Pain, redness, or swelling with sores inside the mouth or throat Unusual weakness or fatigue Vomiting This list may not describe all possible side effects. Call your doctor for medical advice about side effects. You may report side effects to FDA at 1-800-FDA-1088. Where should I keep my medication? This medication is given in a hospital or clinic. It will not be stored at home. NOTE: This sheet is a summary. It may not cover all possible information. If you have questions about this medicine, talk to your doctor, pharmacist, or health care provider.  2024 Elsevier/Gold  Standard (2022-03-09 00:00:00) Leucovorin Injection What is this medication? LEUCOVORIN (loo koe VOR in) prevents side effects from certain medications, such as methotrexate. It works by increasing folate levels. This helps protect healthy cells in your body. It may also be used to treat anemia caused by low levels of folate. It can also be used with fluorouracil, a type of chemotherapy, to treat colorectal cancer. It works by increasing the effects of fluorouracil in the body. This medicine may be used for other purposes; ask your health care provider or pharmacist if you have questions. What should I tell my care team before I take this medication? They need to know if you have any of these conditions: Anemia from low levels of vitamin B12 in the  blood An unusual or allergic reaction to leucovorin, folic acid, other medications, foods, dyes, or preservatives Pregnant or trying to get pregnant Breastfeeding How should I use this medication? This medication is injected into a vein or a muscle. It is given by your care team in a hospital or clinic setting. Talk to your care team about the use of this medication in children. Special care may be needed. Overdosage: If you think you have taken too much of this medicine contact a poison control center or emergency room at once. NOTE: This medicine is only for you. Do not share this medicine with others. What if I miss a dose? Keep appointments for follow-up doses. It is important not to miss your dose. Call your care team if you are unable to keep an appointment. What may interact with this medication? Capecitabine Fluorouracil Phenobarbital Phenytoin Primidone Trimethoprim;sulfamethoxazole This list may not describe all possible interactions. Give your health care provider a list of all the medicines, herbs, non-prescription drugs, or dietary supplements you use. Also tell them if you smoke, drink alcohol, or use illegal drugs. Some items may interact with your medicine. What should I watch for while using this medication? Your condition will be monitored carefully while you are receiving this medication. This medication may increase the side effects of 5-fluorouracil. Tell your care team if you have diarrhea or mouth sores that do not get better or that get worse. What side effects may I notice from receiving this medication? Side effects that you should report to your care team as soon as possible: Allergic reactions--skin rash, itching, hives, swelling of the face, lips, tongue, or throat This list may not describe all possible side effects. Call your doctor for medical advice about side effects. You may report side effects to FDA at 1-800-FDA-1088. Where should I keep my  medication? This medication is given in a hospital or clinic. It will not be stored at home. NOTE: This sheet is a summary. It may not cover all possible information. If you have questions about this medicine, talk to your doctor, pharmacist, or health care provider.  2024 Elsevier/Gold Standard (2021-06-04 00:00:00) Fluorouracil Injection What is this medication? FLUOROURACIL (flure oh YOOR a sil) treats some types of cancer. It works by slowing down the growth of cancer cells. This medicine may be used for other purposes; ask your health care provider or pharmacist if you have questions. COMMON BRAND NAME(S): Adrucil What should I tell my care team before I take this medication? They need to know if you have any of these conditions: Blood disorders Dihydropyrimidine dehydrogenase (DPD) deficiency Infection, such as chickenpox, cold sores, herpes Kidney disease Liver disease Poor nutrition Recent or ongoing radiation therapy An unusual or allergic reaction to fluorouracil,  other medications, foods, dyes, or preservatives If you or your partner are pregnant or trying to get pregnant Breast-feeding How should I use this medication? This medication is injected into a vein. It is administered by your care team in a hospital or clinic setting. Talk to your care team about the use of this medication in children. Special care may be needed. Overdosage: If you think you have taken too much of this medicine contact a poison control center or emergency room at once. NOTE: This medicine is only for you. Do not share this medicine with others. What if I miss a dose? Keep appointments for follow-up doses. It is important not to miss your dose. Call your care team if you are unable to keep an appointment. What may interact with this medication? Do not take this medication with any of the following: Live virus vaccines This medication may also interact with the following: Medications that treat or  prevent blood clots, such as warfarin, enoxaparin, dalteparin This list may not describe all possible interactions. Give your health care provider a list of all the medicines, herbs, non-prescription drugs, or dietary supplements you use. Also tell them if you smoke, drink alcohol, or use illegal drugs. Some items may interact with your medicine. What should I watch for while using this medication? Your condition will be monitored carefully while you are receiving this medication. This medication may make you feel generally unwell. This is not uncommon as chemotherapy can affect healthy cells as well as cancer cells. Report any side effects. Continue your course of treatment even though you feel ill unless your care team tells you to stop. In some cases, you may be given additional medications to help with side effects. Follow all directions for their use. This medication may increase your risk of getting an infection. Call your care team for advice if you get a fever, chills, sore throat, or other symptoms of a cold or flu. Do not treat yourself. Try to avoid being around people who are sick. This medication may increase your risk to bruise or bleed. Call your care team if you notice any unusual bleeding. Be careful brushing or flossing your teeth or using a toothpick because you may get an infection or bleed more easily. If you have any dental work done, tell your dentist you are receiving this medication. Avoid taking medications that contain aspirin, acetaminophen, ibuprofen, naproxen, or ketoprofen unless instructed by your care team. These medications may hide a fever. Do not treat diarrhea with over the counter products. Contact your care team if you have diarrhea that lasts more than 2 days or if it is severe and watery. This medication can make you more sensitive to the sun. Keep out of the sun. If you cannot avoid being in the sun, wear protective clothing and sunscreen. Do not use sun lamps,  tanning beds, or tanning booths. Talk to your care team if you or your partner wish to become pregnant or think you might be pregnant. This medication can cause serious birth defects if taken during pregnancy and for 3 months after the last dose. A reliable form of contraception is recommended while taking this medication and for 3 months after the last dose. Talk to your care team about effective forms of contraception. Do not father a child while taking this medication and for 3 months after the last dose. Use a condom while having sex during this time period. Do not breastfeed while taking this medication. This medication may  cause infertility. Talk to your care team if you are concerned about your fertility. What side effects may I notice from receiving this medication? Side effects that you should report to your care team as soon as possible: Allergic reactions--skin rash, itching, hives, swelling of the face, lips, tongue, or throat Heart attack--pain or tightness in the chest, shoulders, arms, or jaw, nausea, shortness of breath, cold or clammy skin, feeling faint or lightheaded Heart failure--shortness of breath, swelling of the ankles, feet, or hands, sudden weight gain, unusual weakness or fatigue Heart rhythm changes--fast or irregular heartbeat, dizziness, feeling faint or lightheaded, chest pain, trouble breathing High ammonia level--unusual weakness or fatigue, confusion, loss of appetite, nausea, vomiting, seizures Infection--fever, chills, cough, sore throat, wounds that don't heal, pain or trouble when passing urine, general feeling of discomfort or being unwell Low red blood cell level--unusual weakness or fatigue, dizziness, headache, trouble breathing Pain, tingling, or numbness in the hands or feet, muscle weakness, change in vision, confusion or trouble speaking, loss of balance or coordination, trouble walking, seizures Redness, swelling, and blistering of the skin over hands and  feet Severe or prolonged diarrhea Unusual bruising or bleeding Side effects that usually do not require medical attention (report to your care team if they continue or are bothersome): Dry skin Headache Increased tears Nausea Pain, redness, or swelling with sores inside the mouth or throat Sensitivity to light Vomiting This list may not describe all possible side effects. Call your doctor for medical advice about side effects. You may report side effects to FDA at 1-800-FDA-1088. Where should I keep my medication? This medication is given in a hospital or clinic. It will not be stored at home. NOTE: This sheet is a summary. It may not cover all possible information. If you have questions about this medicine, talk to your doctor, pharmacist, or health care provider.  2024 Elsevier/Gold Standard (2021-05-07 00:00:00)

## 2022-08-04 NOTE — Progress Notes (Signed)
Hypersensitivity Reaction note  Date of event: 08/04/22 Time of event: 1413 Generic name of drug involved: oxaliplatin and leucovorin.   Name of provider notified of the hypersensitivity reaction: Lonna Cobb, NP. Was agent that likely caused hypersensitivity reaction added to Allergies List within EMR? yes Chain of events including reaction signs/symptoms, treatment administered, and outcome (e.g., drug resumed; drug discontinued; sent to Emergency Department; etc.)   Patient started to c/o itchy palms, oxaliplatin and leucovorin were stopped, VS checked, fluids started (see MAR), patient with no other complaints or symptoms. Lonna Cobb, NP made aware. Verbal order received for additional premeds 25mg  of benedryl given and 20mg  of pepcid given per Lonna Cobb. Oxaliplatin and leucovorin will not be restarted as she was at the end of infusion.  Patient monitored for 15 minutes after onset of pepcid. Patient reported symptoms had subsided. Remainder of treatment administered, VS stable when leaving infusion room.   Geraldo Docker, RN 08/04/2022 2:28 PM

## 2022-08-04 NOTE — Patient Instructions (Signed)
[employer], Adult A central line is a long, thin tube (catheter) that can be used to collect blood for testing or to give medicine through a vein. The tip of the central line ends in a large vein just above the heart (vena cava). A central line may be placed because: You need to get medicines or fluids through an IV for a long period of time. You need nutrition but cannot eat or absorb nutrients. The veins in your hands or arms are difficult to use for IV access. You need a blood transfusion. You need chemotherapy or dialysis. Types of central lines There are four main types of central lines: Peripherally inserted central catheter (PICC) line. This type is used for access of one week or longer. It can be used to draw blood and give fluids or medicines. A PICC looks like an IV tube, but it goes up the arm to the heart. It is usually inserted in the upper arm and taped in place on the arm. Tunneled central line. This type is used for long-term therapy and dialysis. It is placed in a large vein in the neck, chest, or groin. It is inserted through a small incision made over the vein, and then it is advanced to the heart. It is tunneled under the skin and brought out through a second incision. Non-tunneled central line. This type is used for short-term access, usually for a maximum of 7 days. It is often used in the emergency department. It is inserted in the neck, chest, or groin. Implanted port. This type is used for long-term therapy. It can stay in place longer than other types of central lines. It is normally inserted in the upper chest, but it can also be placed in the upper arm or the abdomen. It is inserted and removed with surgery, and it is accessed using a needle. The type of central line that you receive depends on how long you will need it, your medical condition, and the condition of your veins. Tell a health care provider about: Any allergies you have. All medicines you are taking,  including vitamins, herbs, eye drops, creams, and over-the-counter medicines. Any problems you or family members have had with anesthetic medicines. Any blood disorders you have. Any surgeries you have had. Any medical conditions you have. Whether you are pregnant or may be pregnant. What are the risks? Generally, placement and use of a central line is safe. However, problems may occur, including: Infection. A blood clot that blocks the central line or forms in the vein and travels to the heart. Bleeding from the place where the central line was inserted. Developing a hole or crack within the central line. If this happens, the central line will need to be replaced. Central line failure. The catheter moving or coming out of place. What happens before the procedure? Medicines Ask your health care provider about: Changing or stopping your regular medicines. This is especially important if you are taking diabetes medicines or blood thinners. Taking medicines such as aspirin and ibuprofen. These medicines can thin your blood. Do not take these medicines unless your health care provider tells you to take them. Taking over-the-counter medicines, vitamins, herbs, and supplements. General instructions Follow instructions from your health care provider about eating or drinking restrictions. Ask your health care provider: How your procedure site will be marked. What steps will be taken to help prevent infection. These steps may include: Removing hair at the procedure site. Washing skin with a germ-killing soap.  Plan to have a responsible adult take you home from the hospital or clinic. If you will be going home right after the procedure, plan to have a responsible adult care for you for the time you are told. This is important. What happens during the procedure? The procedure will vary depending on the type of central line being placed. In general: An IV will be inserted into one of your  veins. You will be given one or more of the following: A medicine to help you relax (sedative). A medicine to numb the area (local anesthetic). Your skin will be cleaned with a germ-killing (antiseptic) solution, and you may be covered with sterile drapes. Your blood pressure, heart rate, breathing rate, and blood oxygen level will be monitored during the procedure. The central line catheter will be inserted into the vein and advanced to the correct spot. The health care provider may use X-ray equipment to help guide the catheter to the right place. A bandage (dressing) will be placed over the insertion area. The procedure may vary among health care providers and hospitals. What can I expect after the procedure? Your blood pressure, heart rate, breathing rate, and blood oxygen level will be monitored until you leave the hospital or clinic. Antiseptic caps may be placed on the ends of the central line tubing. If you were given a sedative during the procedure, it can affect you for several hours. Do not drive or operate machinery until your health care provider says that it is safe. Follow these instructions at home: Flushing and cleaning the central line  Follow instructions from your health care provider about flushing and cleaning the central line and the area around it. Only use sterile supplies to flush the central line. Use supplies from your health care provider, a pharmacy, or another source that is recommended by your health care provider. Before you flush the central line or clean the central line or the area around it: Wash your hands with soap and water for at least 20 seconds. If soap and water are not available, use alcohol-based hand sanitizer. Clean the central line hub with rubbing alcohol. Unless directed otherwise by the manufacturer's instructions, scrub using a twisting motion and rub for 10 to 15 seconds or for 30 twists. Be sure you scrub the top of the hub, not just the  sides. Never reuse alcohol pads. Let the hub dry before use. Prevent it from touching anything while drying. Caring for the incision or central line site Check your incision or central line site every day for signs of infection. Check for: Redness, swelling, or pain. Fluid or blood. Warmth. Pus or a bad smell. Keep the insertion site of your central line clean and dry at all times. Change your dressing only as told by your health care provider. Keep your dressing dry. If it gets wet, have it changed as soon as possible. General instructions Follow instructions from your health care provider for the type of device that you have. Keep the tube clamped, unless it is being used. If the central line accidentally gets pulled on, make sure: The dressing is okay. There is no bleeding. The line has not been pulled out. Do not use scissors or sharp objects near the tube. Do not take baths, swim, or use a hot tub until your health care provider approves. Ask your health care provider if you may take showers. You may only be allowed to take sponge baths. Ask your health care provider what activities are  safe for you. You may be restricted from lifting or making repetitive arm movements on the side of your central line. Take over-the-counter and prescription medicines only as told by your health care provider. Keep all follow-up visits. This is important. Storage and disposal of supplies Keep your supplies in a clean, dry location. Throw away any used syringes in a disposal container that is meant for sharp items (sharps container). You can buy a sharps container from a pharmacy, or you can make one by using an empty hard plastic bottle with a cover. Place any used dressings or infusion bags into a plastic bag. Throw that bag in the trash. Contact a health care provider if: You have redness, swelling, or pain around your insertion site. You have fluid or blood coming from your insertion site. Your  insertion site feels warm to the touch. You have pus or a bad smell coming from your insertion site. Get help right away if: You have: A fever or chills. Shortness of breath. Chest pain or a racing heartbeat. Swelling in your neck, face, chest, or arm on the side of your central line. You feel dizzy or you faint. Your incision or central line site has red streaks spreading away from the area. Your incision or central line site is bleeding and does not stop. Your central line is difficult to flush or will not flush. You do not get a blood return from the central line. Your central line gets loose or damaged or comes out. Your catheter leaks when flushed or when fluids are infused into it. Summary A central line is a long, thin tube (catheter) that can be used to give medicine through a vein. Follow specific instructions from your health care provider for the type of device that you have. Keep the insertion site of your central line clean and dry at all times. Keep the tube clamped, unless it is being used. This information is not intended to replace advice given to you by your health care provider. Make sure you discuss any questions you have with your health care provider. Document Revised: 08/31/2019 Document Reviewed: 09/01/2019 Elsevier Patient Education  2024 [employer].

## 2022-08-04 NOTE — Progress Notes (Signed)
Patient seen by Lonna Cobb NP today  Vitals are within treatment parameters.No intervention needed for BP 142/90 (recheck)  Labs reviewed by Lonna Cobb NP and are not all within treatment parameters. K+ 3.3 --OK to proceed  Per physician team, patient is ready for treatment and there are NO modifications to the treatment plan.

## 2022-08-05 ENCOUNTER — Encounter: Payer: Self-pay | Admitting: Gastroenterology

## 2022-08-05 ENCOUNTER — Other Ambulatory Visit: Payer: Self-pay

## 2022-08-05 DIAGNOSIS — C19 Malignant neoplasm of rectosigmoid junction: Secondary | ICD-10-CM

## 2022-08-06 ENCOUNTER — Inpatient Hospital Stay: Payer: 59

## 2022-08-06 VITALS — BP 152/98 | HR 89 | Temp 98.2°F | Resp 18

## 2022-08-06 DIAGNOSIS — Z5111 Encounter for antineoplastic chemotherapy: Secondary | ICD-10-CM | POA: Diagnosis not present

## 2022-08-06 DIAGNOSIS — Z452 Encounter for adjustment and management of vascular access device: Secondary | ICD-10-CM

## 2022-08-06 DIAGNOSIS — R110 Nausea: Secondary | ICD-10-CM

## 2022-08-06 DIAGNOSIS — C19 Malignant neoplasm of rectosigmoid junction: Secondary | ICD-10-CM

## 2022-08-06 MED ORDER — HEPARIN SOD (PORK) LOCK FLUSH 100 UNIT/ML IV SOLN
500.0000 [IU] | Freq: Once | INTRAVENOUS | Status: AC | PRN
  Administered 2022-08-06: 250 [IU]

## 2022-08-06 MED ORDER — SODIUM CHLORIDE 0.9 % IV SOLN: Freq: Once | INTRAVENOUS | Status: AC

## 2022-08-06 MED ORDER — SODIUM CHLORIDE 0.9 % IV SOLN
10.0000 mg | Freq: Once | INTRAVENOUS | Status: AC
  Administered 2022-08-06: 10 mg via INTRAVENOUS
  Filled 2022-08-06: qty 10

## 2022-08-06 MED ORDER — SODIUM CHLORIDE 0.9% FLUSH
10.0000 mL | INTRAVENOUS | Status: DC | PRN
  Administered 2022-08-06: 10 mL

## 2022-08-06 MED ORDER — PEGFILGRASTIM-CBQV 6 MG/0.6ML ~~LOC~~ SOSY
6.0000 mg | PREFILLED_SYRINGE | Freq: Once | SUBCUTANEOUS | Status: AC
  Administered 2022-08-06: 6 mg via SUBCUTANEOUS
  Filled 2022-08-06: qty 0.6

## 2022-08-06 MED ORDER — SODIUM CHLORIDE 0.9% FLUSH
10.0000 mL | Freq: Once | INTRAVENOUS | Status: AC
  Administered 2022-08-06: 10 mL via INTRAVENOUS

## 2022-08-06 MED ORDER — HEPARIN SOD (PORK) LOCK FLUSH 100 UNIT/ML IV SOLN
250.0000 [IU] | Freq: Once | INTRAVENOUS | Status: AC
  Administered 2022-08-06: 250 [IU] via INTRAVENOUS

## 2022-08-06 NOTE — Progress Notes (Signed)
Patient in for pump stop/IVF.  Reports nausea without vomiting today.  Patient stated she has not been able to eat anything due to the fear that she would "throw-up".  Patient stated she has not been able to take her home decadron or nausea meds due to her nausea.  Lonna Cobb, NP made aware. Verbal order received to give IV decadron (see MAR) and for patient to delay start of home decadron to 08/07/22.  Patient made aware of Lonna Cobb, NP's response and verbalized understanding.  Patient agreed to wait to start her home decadron until 08/07/22.

## 2022-08-06 NOTE — Patient Instructions (Signed)

## 2022-08-11 DIAGNOSIS — R519 Headache, unspecified: Secondary | ICD-10-CM | POA: Insufficient documentation

## 2022-08-11 NOTE — Assessment & Plan Note (Signed)
Patient has been using Tylenol for headache relief and reports it as effective. It is not clear if the headaches are directly correlating with blood pressure as they are present when blood pressures are high and low.  Plan:   - Continue using Tylenol as needed for headache relief. - Monitor effectiveness and report any changes in headache frequency or severity.

## 2022-08-11 NOTE — Assessment & Plan Note (Signed)
Patient reports headaches when blood pressure was around 110/70. She has not been taking amlodipine and has been off chlorthalidone due to concerns of low blood pressure. Currently experiencing a mild headache. Her blood pressure is slightly elevated today at 126/95. I am not sure there is a direct correlation with her BP and the headaches at this time given the persistent headache when BP his low and high.  Plan: - Restart amlodipine at 5 mg daily for blood pressure management. - Continue to hold chlorthalidone unless blood pressure runs high (>140/90), then you may take half of a chlorthalidone tablet as needed. - Monitor blood pressure at home and report any significant changes or persistent headaches.

## 2022-08-11 NOTE — Assessment & Plan Note (Signed)
Patient is currently undergoing chemotherapy (cycle 16 of 18) and reports indigestion, off-balance feeling, and excessive sweating with yellow staining on sheets. Plan: - Encourage patient to restart Pantoprazole (Protonix) for indigestion management. - Advise patient to rest and sleep when feeling fatigued or unwell to support immune system. - Monitor side effects and report any new or worsening symptoms.

## 2022-08-12 ENCOUNTER — Encounter: Payer: Self-pay | Admitting: Oncology

## 2022-08-12 ENCOUNTER — Telehealth: Payer: Self-pay | Admitting: Gastroenterology

## 2022-08-12 ENCOUNTER — Encounter: Payer: 59 | Admitting: Gastroenterology

## 2022-08-12 NOTE — Telephone Encounter (Signed)
1 month recall placed.

## 2022-08-12 NOTE — Telephone Encounter (Signed)
Good Afternoon Dr. Meridee Score,  I called this patient at 1:15 pm today she stated she did not feel well had Chemo therapy. She stated she tried calling yesterday and did not get anyone to answer.  She will call back to reschedule once she is feeling better.

## 2022-08-12 NOTE — Telephone Encounter (Signed)
I am sorry to hear that she is unwell.  She should be able to use MyChart in the future if there are issues.  We will not charge her.  Put in a recall for 1 month from now so that if she has not called to reschedule it will come up.  Thanks. GM

## 2022-08-13 ENCOUNTER — Inpatient Hospital Stay: Payer: 59

## 2022-08-13 VITALS — BP 144/72 | HR 99 | Temp 98.1°F | Resp 18

## 2022-08-13 DIAGNOSIS — Z452 Encounter for adjustment and management of vascular access device: Secondary | ICD-10-CM

## 2022-08-13 DIAGNOSIS — Z5111 Encounter for antineoplastic chemotherapy: Secondary | ICD-10-CM | POA: Diagnosis not present

## 2022-08-13 MED ORDER — HEPARIN SOD (PORK) LOCK FLUSH 100 UNIT/ML IV SOLN
250.0000 [IU] | Freq: Once | INTRAVENOUS | Status: AC
  Administered 2022-08-13: 250 [IU] via INTRAVENOUS

## 2022-08-13 MED ORDER — SODIUM CHLORIDE 0.9% FLUSH
10.0000 mL | Freq: Once | INTRAVENOUS | Status: AC
  Administered 2022-08-13: 10 mL via INTRAVENOUS

## 2022-08-13 NOTE — Patient Instructions (Signed)
PICC Home Care Guide A peripherally inserted central catheter (PICC) is a form of IV access that allows medicines and IV fluids to be quickly put into the blood and spread throughout the body. The PICC is a long, thin, flexible tube (catheter) that is put into a vein in a person's arm or leg. The catheter ends in a large vein just outside the heart called the superior vena cava (SVC). After the PICC is put in, a chest X-ray may be done to make sure that it is in the right place. A PICC may be placed for different reasons, such as: To give medicines and liquid nutrition. To give IV fluids and blood products. To take blood samples often. If there is trouble placing a peripheral intravenous (PIV) catheter. If cared for properly, a PICC can remain in place for many months. Having a PICC can allow you to go home from the hospital sooner and continue treatment at home. Medicines and PICC care can be managed at home by a family member, caregiver, or home health care team. What are the risks? Generally, having a PICC is safe. However, problems may occur, including: A blood clot (thrombus) forming in or at the end of the PICC. A blood clot forming in a vein (deep vein thrombosis) or traveling to the lung (pulmonary embolism). Inflammation of the vein (phlebitis) in which the PICC is placed. Infection at the insertion site or in the blood. Blood infections from central lines, like PICCs, can be serious and often require a hospital stay. PICC malposition, or PICC movement or poor placement. A break or cut in the PICC. Do not use scissors near the PICC. Nerve or tendon irritation or injury during PICC insertion. How to care for your PICC Please follow the specific guidelines provided by your health care provider. Preventing infection You and any caregivers should wash your hands often with soap and water for at least 20 seconds. Wash hands: Before touching the PICC or the infusion device. Before changing a  bandage (dressing). Do not change the dressing unless you have been taught to do so and have shown you are able to change it safely. Flush the PICC as told. Tell your health care provider right away if the PICC is hard to flush or does not flush. Do not use force to flush the PICC. Use clean and germ-free (sterile) supplies only. Keep the supplies in a dry place. Do not reuse needles, syringes, or any other supplies. Reusing supplies can lead to infection. Keep the PICC dressing dry and secure it with tape if the edges stop sticking to your skin. Check your PICC insertion site every day for signs of infection. Check for: Redness, swelling, or pain. Fluid or blood. Warmth. Pus or a bad smell. Preventing other problems Do not use a syringe that is less than 10 mL to flush the PICC. Do not have your blood pressure checked on the arm in which the PICC is placed. Do not ever pull or tug on the PICC. Keep it secured to your arm with tape or a stretch wrap when not in use. Do not take the PICC out yourself. Only a trained health care provider should remove the PICC. Keep pets and children away from your PICC. How to care for your PICC dressing Keep your PICC dressing clean and dry to prevent infection. Do not take baths, swim, or use a hot tub until your health care provider approves. Ask your health care provider if you can take  showers. You may only be allowed to take sponge baths. When you are allowed to shower: Ask your health care provider to teach you how to wrap the PICC. Cover the PICC with clear plastic wrap and tape to keep it dry while showering. Follow instructions from your health care provider about how to take care of your insertion site and dressing. Make sure you: Wash your hands with soap and water for at least 20 seconds before and after you change your dressing. If soap and water are not available, use hand sanitizer. Change your dressing only if taught to do so by your health care  provider. Your PICC dressing needs to be changed if it becomes loose or wet. Leave stitches (sutures), skin glue, or adhesive strips in place. These skin closures may need to stay in place for 2 weeks or longer. If adhesive strip edges start to loosen and curl up, you may trim the loose edges. Do not remove adhesive strips completely unless your health care provider tells you to do that. Follow these instructions at home: Disposal of supplies Throw away any syringes in a disposal container that is meant for sharp items (sharps container). You can buy a sharps container from a pharmacy, or you can make one by using an empty, hard plastic bottle with a lid. Place any used dressings or infusion bags into a plastic bag. Throw that bag in the trash. General instructions  Always carry your PICC identification card or wear a medical alert bracelet. Keep the tube clamped at all times, unless it is being used. Always carry a smooth-edge clamp with you to clamp the PICC if it breaks. Do not use scissors or sharp objects near the tube. You may bend your arm and move it freely. If your PICC is near or at the bend of your elbow, avoid activity with repeated motion at the elbow. Avoid lifting heavy objects as told by your health care provider. Keep all follow-up visits. This is important. You will need to have your PICC dressing changed at least once a week. Contact a health care provider if: You have pain in your arm, ear, face, or teeth. You have a fever or chills. You have redness, swelling, or pain around the insertion site. You have fluid or blood coming from the insertion site. Your insertion site feels warm to the touch. You have pus or a bad smell coming from the insertion site. Your skin feels hard and raised around the insertion site. Your PICC dressing has gotten wet or is coming off and you have not been taught how to change it. Get help right away if: You have problems with your PICC, such as  your PICC: Was tugged or pulled and has partially come out. Do not  push the PICC back in. Cannot be flushed, is hard to flush, or leaks around the insertion site when it is flushed. Makes a flushing sound when it is flushed. Appears to have a hole or tear. Is accidentally pulled all the way out. If this happens, cover the insertion site with a gauze dressing. Do not throw the PICC away. Your health care provider will need to check it to be sure the entire catheter came out. You feel your heart racing or skipping beats, or you have chest pain. You have shortness of breath or trouble breathing. You have swelling, redness, warmth, or pain in the arm in which the PICC is placed. You have a red streak going up your arm that  starts under the PICC dressing. These symptoms may be an emergency. Get help right away. Call 911. Do not wait to see if the symptoms will go away. Do not drive yourself to the hospital. Summary A peripherally inserted central catheter (PICC) is a long, thin, flexible tube (catheter) that is put into a vein in the arm or leg. If cared for properly, a PICC can remain in place for many months. Having a PICC can allow you to go home from the hospital sooner and continue treatment at home. The PICC is inserted using a germ-free (sterile) technique by a specially trained health care provider. Only a trained health care provider should remove it. Do not have your blood pressure checked on the arm in which your PICC is placed. Always keep your PICC identification card with you. This information is not intended to replace advice given to you by your health care provider. Make sure you discuss any questions you have with your health care provider. Document Revised: 07/18/2020 Document Reviewed: 07/18/2020 Elsevier Patient Education  2024 [employer].

## 2022-08-14 ENCOUNTER — Encounter: Payer: Self-pay | Admitting: Oncology

## 2022-08-17 ENCOUNTER — Other Ambulatory Visit: Payer: Self-pay | Admitting: Oncology

## 2022-08-17 DIAGNOSIS — C19 Malignant neoplasm of rectosigmoid junction: Secondary | ICD-10-CM

## 2022-08-18 ENCOUNTER — Encounter: Payer: Self-pay | Admitting: Oncology

## 2022-08-18 ENCOUNTER — Other Ambulatory Visit (HOSPITAL_BASED_OUTPATIENT_CLINIC_OR_DEPARTMENT_OTHER): Payer: Self-pay

## 2022-08-18 ENCOUNTER — Inpatient Hospital Stay: Payer: 59

## 2022-08-18 ENCOUNTER — Other Ambulatory Visit: Payer: Self-pay

## 2022-08-18 ENCOUNTER — Inpatient Hospital Stay: Payer: 59 | Attending: Oncology

## 2022-08-18 ENCOUNTER — Encounter: Payer: Self-pay | Admitting: Nurse Practitioner

## 2022-08-18 ENCOUNTER — Inpatient Hospital Stay (HOSPITAL_BASED_OUTPATIENT_CLINIC_OR_DEPARTMENT_OTHER): Payer: 59 | Admitting: Nurse Practitioner

## 2022-08-18 VITALS — BP 138/85

## 2022-08-18 VITALS — BP 139/90 | HR 100 | Temp 98.1°F | Resp 18 | Ht 64.0 in | Wt 149.3 lb

## 2022-08-18 DIAGNOSIS — K6289 Other specified diseases of anus and rectum: Secondary | ICD-10-CM | POA: Diagnosis not present

## 2022-08-18 DIAGNOSIS — C786 Secondary malignant neoplasm of retroperitoneum and peritoneum: Secondary | ICD-10-CM | POA: Insufficient documentation

## 2022-08-18 DIAGNOSIS — Z5111 Encounter for antineoplastic chemotherapy: Secondary | ICD-10-CM | POA: Diagnosis present

## 2022-08-18 DIAGNOSIS — R112 Nausea with vomiting, unspecified: Secondary | ICD-10-CM | POA: Insufficient documentation

## 2022-08-18 DIAGNOSIS — C19 Malignant neoplasm of rectosigmoid junction: Secondary | ICD-10-CM

## 2022-08-18 DIAGNOSIS — C187 Malignant neoplasm of sigmoid colon: Secondary | ICD-10-CM | POA: Insufficient documentation

## 2022-08-18 DIAGNOSIS — I10 Essential (primary) hypertension: Secondary | ICD-10-CM | POA: Diagnosis not present

## 2022-08-18 DIAGNOSIS — Z79899 Other long term (current) drug therapy: Secondary | ICD-10-CM | POA: Diagnosis not present

## 2022-08-18 DIAGNOSIS — C20 Malignant neoplasm of rectum: Secondary | ICD-10-CM

## 2022-08-18 DIAGNOSIS — Z5189 Encounter for other specified aftercare: Secondary | ICD-10-CM | POA: Diagnosis not present

## 2022-08-18 LAB — CBC WITH DIFFERENTIAL (CANCER CENTER ONLY)
Abs Immature Granulocytes: 0.11 10*3/uL — ABNORMAL HIGH (ref 0.00–0.07)
Basophils Absolute: 0.1 10*3/uL (ref 0.0–0.1)
Basophils Relative: 1 %
Eosinophils Absolute: 0.5 10*3/uL (ref 0.0–0.5)
Eosinophils Relative: 5 %
HCT: 37.8 % (ref 36.0–46.0)
Hemoglobin: 12.5 g/dL (ref 12.0–15.0)
Immature Granulocytes: 1 %
Lymphocytes Relative: 24 %
Lymphs Abs: 2.5 10*3/uL (ref 0.7–4.0)
MCH: 31.2 pg (ref 26.0–34.0)
MCHC: 33.1 g/dL (ref 30.0–36.0)
MCV: 94.3 fL (ref 80.0–100.0)
Monocytes Absolute: 0.8 10*3/uL (ref 0.1–1.0)
Monocytes Relative: 8 %
Neutro Abs: 6.4 10*3/uL (ref 1.7–7.7)
Neutrophils Relative %: 61 %
Platelet Count: 201 10*3/uL (ref 150–400)
RBC: 4.01 MIL/uL (ref 3.87–5.11)
RDW: 17.4 % — ABNORMAL HIGH (ref 11.5–15.5)
WBC Count: 10.4 10*3/uL (ref 4.0–10.5)
nRBC: 0 % (ref 0.0–0.2)

## 2022-08-18 LAB — CMP (CANCER CENTER ONLY)
ALT: 9 U/L (ref 0–44)
AST: 16 U/L (ref 15–41)
Albumin: 4.2 g/dL (ref 3.5–5.0)
Alkaline Phosphatase: 179 U/L — ABNORMAL HIGH (ref 38–126)
Anion gap: 10 (ref 5–15)
BUN: 8 mg/dL (ref 6–20)
CO2: 26 mmol/L (ref 22–32)
Calcium: 9.3 mg/dL (ref 8.9–10.3)
Chloride: 103 mmol/L (ref 98–111)
Creatinine: 0.5 mg/dL (ref 0.44–1.00)
GFR, Estimated: >60 mL/min (ref >60)
Glucose, Bld: 96 mg/dL (ref 70–99)
Potassium: 3.4 mmol/L — ABNORMAL LOW (ref 3.5–5.1)
Sodium: 139 mmol/L (ref 135–145)
Total Bilirubin: 0.2 mg/dL — ABNORMAL LOW (ref 0.3–1.2)
Total Protein: 7.9 g/dL (ref 6.5–8.1)

## 2022-08-18 LAB — CEA (ACCESS): CEA (CHCC): 2.66 ng/mL (ref 0.00–5.00)

## 2022-08-18 MED ORDER — SODIUM CHLORIDE 0.9 % IV SOLN
10.0000 mg | Freq: Once | INTRAVENOUS | Status: AC
  Administered 2022-08-18: 10 mg via INTRAVENOUS
  Filled 2022-08-18: qty 10

## 2022-08-18 MED ORDER — FAMOTIDINE IN NACL 20-0.9 MG/50ML-% IV SOLN
20.0000 mg | Freq: Once | INTRAVENOUS | Status: AC
  Administered 2022-08-18: 20 mg via INTRAVENOUS
  Filled 2022-08-18: qty 50

## 2022-08-18 MED ORDER — DEXTROSE 5 % IV SOLN: Freq: Once | INTRAVENOUS | Status: AC

## 2022-08-18 MED ORDER — SODIUM CHLORIDE 0.9 % IV SOLN
150.0000 mg | Freq: Once | INTRAVENOUS | Status: AC
  Administered 2022-08-18: 150 mg via INTRAVENOUS
  Filled 2022-08-18: qty 150

## 2022-08-18 MED ORDER — PALONOSETRON HCL INJECTION 0.25 MG/5ML
0.2500 mg | Freq: Once | INTRAVENOUS | Status: AC
  Administered 2022-08-18: 0.25 mg via INTRAVENOUS
  Filled 2022-08-18: qty 5

## 2022-08-18 MED ORDER — DIPHENHYDRAMINE HCL 50 MG/ML IJ SOLN
50.0000 mg | Freq: Once | INTRAMUSCULAR | Status: AC
  Administered 2022-08-18: 50 mg via INTRAVENOUS
  Filled 2022-08-18: qty 1

## 2022-08-18 MED ORDER — OXALIPLATIN CHEMO INJECTION 100 MG/20ML
65.0000 mg/m2 | Freq: Once | INTRAVENOUS | Status: AC
  Administered 2022-08-18: 115 mg via INTRAVENOUS
  Filled 2022-08-18: qty 20

## 2022-08-18 MED ORDER — SODIUM CHLORIDE 0.9 % IV SOLN
3100.0000 mg | INTRAVENOUS | Status: DC
  Administered 2022-08-18: 3100 mg via INTRAVENOUS
  Filled 2022-08-18: qty 62

## 2022-08-18 MED ORDER — HYDROCODONE-ACETAMINOPHEN 10-325 MG PO TABS
1.0000 | ORAL_TABLET | Freq: Four times a day (QID) | ORAL | 0 refills | Status: DC | PRN
Start: 2022-08-18 — End: 2022-09-08
  Filled 2022-08-18: qty 45, 12d supply, fill #0

## 2022-08-18 MED ORDER — ALTEPLASE 2 MG IJ SOLR
2.0000 mg | Freq: Once | INTRAMUSCULAR | Status: AC
  Administered 2022-08-18: 2 mg
  Filled 2022-08-18: qty 2

## 2022-08-18 MED ORDER — FLUOROURACIL CHEMO INJECTION 2.5 GM/50ML
400.0000 mg/m2 | Freq: Once | INTRAVENOUS | Status: AC
  Administered 2022-08-18: 700 mg via INTRAVENOUS
  Filled 2022-08-18: qty 14

## 2022-08-18 MED ORDER — LEUCOVORIN CALCIUM INJECTION 350 MG
400.0000 mg/m2 | Freq: Once | INTRAVENOUS | Status: AC
  Administered 2022-08-18: 704 mg via INTRAVENOUS
  Filled 2022-08-18: qty 35.2

## 2022-08-18 NOTE — Progress Notes (Signed)
[facility] OFFICE PROGRESS NOTE   Diagnosis: Colon cancer  INTERVAL HISTORY:   April [name] returns as scheduled.  She completed cycle 8 FOLFOX 08/04/2022.  Toward the end of the oxaliplatin infusion she developed pruritus on both palms.  She received Pepcid and Benadryl with resolution of the pruritus.  The infusion was not resumed.  When she was here for the pump discontinuation she had significant nausea/vomiting.  She received a dose of IV Decadron and IV fluids with significant improvement.  She had no further nausea after day 3.  No mouth sores.  No diarrhea.  Cold sensitivity lasted about 7 days.  No numbness or tingling in the hands or feet today.  Last night she had an episode of back pain.  She took a dose of pain medication and the pain resolved.  Objective:  Vital signs in last 24 hours:  Blood pressure (!) 139/90, pulse 100, temperature 98.1 F (36.7 C), temperature source Oral, resp. rate 18, height 5\' 4"  (1.626 m), weight 149 lb 4.8 oz (67.7 kg), last menstrual period 01/07/2006, SpO2 100%.    HEENT: White coating over tongue.  No buccal thrush.  No ulcers. Resp: Lungs clear bilaterally. Cardio: Regular rate and rhythm. GI: No hepatosplenomegaly. Vascular: No leg edema. Neuro: Vibratory sense intact through the fingertips for tuning fork exam. Skin: Palms with mild hyperpigmentation, no erythema. Left upper extremity PICC without erythema.  Lab Results:  Lab Results  Component Value Date   WBC 10.4 08/18/2022   HGB 12.5 08/18/2022   HCT 37.8 08/18/2022   MCV 94.3 08/18/2022   PLT 201 08/18/2022   NEUTROABS 6.4 08/18/2022    Imaging:  No results found.  Medications: I have reviewed the patient's current medications.  Assessment/Plan: Sigmoid colon cancer, stage IV (pT4a,pN2b,M1c) Colonoscopy 03/24/2019-3 rectal polyps-hyperplastic polyps, distal colon biopsy-at least intramucosal adenocarcinoma, completely obstructing mass in the distal  sigmoid colon, could not be traversed, intact mismatch repair protein expression 03/24/2019-CEA 56.1 03/29/2019 CT abdomen/pelvis-circumferential thickening involving the entire mid and distal sigmoid colon to the level of the rectosigmoid junction, solid/cystic mass in the left ovary, bilateral nephrolithiasis Robotic assisted low anterior resection, mesenteric lymphadenectomy, bilateral salpingo-oophorectomy 04/08/2019 Pathology ([facility] review of outside pathology) metastatic adenocarcinoma involving the peritoneum overlying the round ligament, serosa of the urinary bladder, serosa of the right ureter a sacral area, pelvic peritoneum, and left ovary.  Omental biopsy with focal mucin pools with no carcinoma cells identified, right hemidiaphragm biopsy involved by metastatic adenocarcinoma, invasive adenocarcinoma the sigmoid colon, moderately differentiated, T4a, perineural and vascular invasion present, 7/22 lymph nodes, multiple tumor deposits, resection margins negative Negative for PD-L1, low probability of MSI-high, HER-2 negative, negative for BRAF, NRAS and KRAS alterations CTs 05/19/2019-no evidence of metastatic disease, findings suspicious for colitis of the transverse and ascending colon, small amount of ascites in the cul-de-sac, bilateral renal calculi Cycle 1 FOLFIRI 05/24/2019, bevacizumab added with cycle 2 Cycle 4 FOLFIRI/bevacizumab 07/05/2019 Cycle 5 FOLFIRI 07/27/2019, bevacizumab held secondary to hypertension Cycle 6 FOLFIRI 08/10/1999, bevacizumab held secondary to hypertension CTs 08/30/2019-no evidence of recurrent disease, new subsolid right lower lobe nodule felt to be inflammatory, emphysema Cycle 7 FOLFIRI 09/26/2019, bevacizumab remains on hold secondary to hypertension Cycle 8 FOLFIRI 10/17/2019, bevacizumab held secondary to hypertension, Udenyca added for neutropenia Cycle 9 FOLFIRI 11/07/2019, bevacizumab held secondary to hypertension, Udenyca Cycle 10 FOLFIRI 11/28/2019,  bevacizumab held, Udenyca Cycle 11 FOLFIRI 12/26/2019, bevacizumab held, Udenyca CTs 01/25/2020-no evidence of recurrent disease, resolution of right lower lobe  nodule Maintenance Xeloda beginning 02/06/2020 CTs 04/25/2020- no evidence of metastatic disease, multiple bilateral renal calculi without hydronephrosis Maintenance Xeloda continued CT abdomen/pelvis without contrast 08/10/2020-bilateral staghorn renal calculi, no evidence of metastatic disease; addendum 08/23/2020-small but increasing omental nodules. Cycle 1 FOLFIRI/panitumumab 09/19/2020 Cycle 2 FOLFIRI/Panitumumab 10/03/2020 Cycle 3 FOLFIRI/Panitumumab 10/17/2020 Cycle 4 FOLFIRI/panitumumab 10/31/2020 Cycle 5 FOLFIRI/Panitumumab 11/14/2020 CTs 11/26/2020-stable omental metastases compared to 10/22/2020, mildly decreased from 08/10/2020.  Left lower quadrant tiny paracolic gutter implant slightly decreased from CT 08/10/2020.  No new or progressive metastatic disease in the abdomen or pelvis. Cycle 6 FOLFIRI/Panitumumab 11/28/2020, irinotecan dose reduced, treatment schedule adjusted to every 3 weeks going forward Cycle 7 FOLFIRI/panitumumab 12/24/2020 Cycle 8 FOLFIRI/Panitumumab 01/16/2021--treatment held due to hypertension in the infusion area. Cycle 8 FOLFIRI/panitumumab 01/21/2021 Cycle 9 FOLFIRI/panitumumab 02/11/2021 Cycle 10 FOLFIRI/Panitumumab 03/04/2021 Cycle 11 FOLFIRI/Panitumumab 03/25/2021 CT abdomen/pelvis 04/10/2021-stable right omental and left posterior paracolic gutter nodules Cycle 12 FOLFIRI/panitumumab 04/15/2021 Cycle 13 FOLFIRI/panitumumab 05/06/2021 Cycle 14 FOLFIRI/Panitumumab 05/27/2021 Cycle 15 FOLFIRI/Panitumumab 06/17/2021 CT abdomen/pelvis 07/05/2021-slight enlargement of a dominant right omental implant, no new implants Patient requested a treatment break CT abdomen/pelvis 10/04/2021-mild increase in size of multiple omental soft tissue nodules, 4 mm calculus in the distal left ureter adjacent to the ureteral stent Cycle 1  Lonsurf 10/14/2021 10/28/2021 Avastin every 2 weeks Cycle 2 Lonsurf 11/11/2021 Cycle 3 Lonsurf 12/09/2021 Cycle 4 Lonsurf 01/06/2022 Avastin held 01/20/2022 due to proteinuria, 24-hour urine 43 mg CT/pelvis 01/30/2022-possible new peritoneal implant at the transverse colon, no significant change in other omental/peritoneal implants, stable chronic rectal wall thickening, status post removal of double-J left ureteral stent with no evidence of hydronephrosis or ureteral calculus, nonobstructing bilateral renal calculi Cycle 5 Lonsurf 02/05/2022 or 02/06/2022 Avastin every 2 weeks Cycle 6 Lonsurf 03/03/2022 CTs 04/05/2022-increase in size and number of peritoneal implants compared to January 2024 central liver mass narrowing the anterior branch of the right portal vein, decreased biliary duct dilation, biliary stents in place, stable right cardiophrenic lymph nodes, separate pigtail stent in the lumen of the proximal duodenum Cycle 1 FOLFOX 04/14/2022 CT abdomen/pelvis 04/16/2022-stent within the third portion of the duodenum, stable peritoneal nodules, indwelling biliary stents with mild left biliary duct dilation Cycle 2 FOLFOX 04/28/2022, Emend and prophylactic dexamethasone added Cycle 3 FOLFOX 05/12/2022, Aloxi, Emend, prophylactic dexamethasone, Compazine and lorazepam as needed Chemotherapy held 05/25/2020 due to severe hypertension, evaluated in the emergency department Cycle 4 FOLFOX 06/03/2022 Cycle 5 FOLFOX 06/16/2022, oxaliplatin dose reduced and 5-FU bolus eliminated due to neutropenia CT 06/25/2022-unchanged soft tissue at the hepatic hilum, unchanged, bile duct stent, decreased size of peritoneal and omental nodules and diminished volume of likely fluid in the lower left pelvis Cycle 6 FOLFOX 06/30/2022 Cycle 7 FOLFOX 07/21/2022 CT 07/24/2022 (in the emergency department with nausea, headache, abdominal pain)-no acute pathology.  No significant interval change in omental nodularity. Cycle 8 FOLFOX  08/04/2022, pruritus on the palms at the end of the oxaliplatin infusion, Pepcid and Benadryl given, symptoms resolved, infusion not resumed Cycle 9 FOLFOX 08/18/2022-Pepcid and Benadryl added to premedications, Oxaliplatin diluted in a larger volume and infusion time increased   Hypertension G4 P3, twins Kidney stones-bilateral staghorn renal calculi on CT 08/10/2020 Infected Port-A-Cath 09/12/2019-placed on Augmentin, referred for Port-A-Cath removal; Port-A-Cath removed 09/14/2019; PICC line placed 09/14/2019; culture staph aureus.  Course of Septra completed. Neutropenia secondary to chemotherapy-Udenyca added with cycle 8 FOLFIRI Right nephrostomy tube 09/12/2020; stent placed 10/09/2020, percutaneous nephrostolithotomy treatment of right-sided kidney stones 10/09/2020, malpositioned right ureter stent replacement 10/23/2020, right ureter  stent removed 10/29/2020 Percutaneous left nephrostolithotomy 10/01/2021-no renal stone identified, left ureter stent left in place Cystoscopy 10/14/2021-stent removed 8.  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  Admission 04/16/2022 with nausea/vomiting and abdominal pain, felt to be acute toxicity related to chemotherapy, discharged home 04/18/2022  Disposition: April [name] appears stable.  She has completed 8 cycles of FOLFOX.  She had pruritus on both palms at the end of the oxaliplatin infusion.  No other symptoms.  The pruritus resolved with Benadryl and Pepcid.  The infusion was  not resumed.  With today's treatment she will receive Benadryl and Pepcid as premedications, oxaliplatin will be diluted in a larger volume and the infusion time will be extended.  She understands there remains risk of an allergic reaction despite these changes and agrees to proceed.  Plan to proceed with cycle 9 FOLFOX today as scheduled.  She will begin prophylactic dexamethasone tomorrow morning.  She will receive a liter of IV fluids on day of pump discontinuation.  CBC reviewed.  Counts adequate to proceed with treatment.  Chemistry panel is pending.  She will return for treatment and follow-up in 3 weeks rather than 2 per her request.  We are available to see her sooner if needed.   Lonna Cobb ANP/GNP-BC   08/18/2022  9:56 AM

## 2022-08-18 NOTE — Patient Instructions (Addendum)
[facility] The chemotherapy medication bag should finish at 46 hours, 96 hours, or 7 days. For example, if your pump is scheduled for 46 hours and it was put on at 4:00 p.m., it should finish at 2:00 p.m. the day it is scheduled to come off regardless of your appointment time.     Estimated time to finish at 12:00 Wednesday, August 20, 2022.   If the display on your pump reads "Low Volume" and it is beeping, take the batteries out of the pump and come to the cancer center for it to be taken off.   If the pump alarms go off prior to the pump reading "Low Volume" then call [phone] and someone can assist you.  If the plunger comes out and the chemotherapy medication is leaking out, please use your home chemo spill kit to clean up the spill. Do NOT use paper towels or other household products.  If you have problems or questions regarding your pump, please call either [phone] (24 hours a day) or the cancer center Monday-Friday 8:00 a.m.- 4:30 p.m. at the clinic number and we will assist you. If you are unable to get assistance, then go to the nearest Emergency Department and ask the staff to contact the IV team for assistance.    Discharge Instructions: Thank you for choosing [facility] to provide your oncology and hematology care.   If you have a lab appointment with the Cancer Center, please go directly to the Cancer Center and check in at the registration area.   Wear comfortable clothing and clothing appropriate for easy access to any Portacath or PICC line.   We strive to give you quality time with your provider. You may need to reschedule your appointment if you arrive late (15 or more minutes).  Arriving late affects you and other patients whose appointments are after yours.  Also, if you miss three or more appointments without notifying the office, you may be dismissed from the clinic at the provider's discretion.      For  prescription refill requests, have your pharmacy contact our office and allow 72 hours for refills to be completed.    Today you received the following chemotherapy and/or immunotherapy agents Oxaliplatin, Leucovorin, Fluorouracil.      To help prevent nausea and vomiting after your treatment, we encourage you to take your nausea medication as directed.  BELOW ARE SYMPTOMS THAT SHOULD BE REPORTED IMMEDIATELY: *FEVER GREATER THAN 100.4 F (38 C) OR HIGHER *CHILLS OR SWEATING *NAUSEA AND VOMITING THAT IS NOT CONTROLLED WITH YOUR NAUSEA MEDICATION *UNUSUAL SHORTNESS OF BREATH *UNUSUAL BRUISING OR BLEEDING *URINARY PROBLEMS (pain or burning when urinating, or frequent urination) *BOWEL PROBLEMS (unusual diarrhea, constipation, pain near the anus) TENDERNESS IN MOUTH AND THROAT WITH OR WITHOUT PRESENCE OF ULCERS (sore throat, sores in mouth, or a toothache) UNUSUAL RASH, SWELLING OR PAIN  UNUSUAL VAGINAL DISCHARGE OR ITCHING   Items with * indicate a potential emergency and should be followed up as soon as possible or go to the Emergency Department if any problems should occur.  Please show the CHEMOTHERAPY ALERT CARD or IMMUNOTHERAPY ALERT CARD at check-in to the Emergency Department and triage nurse.  Should you have questions after your visit or need to cancel or reschedule your appointment, please contact [facility]  Dept: [phone]  and follow the prompts.  Office hours are 8:00 a.m. to 4:30 p.m. Monday - Friday. Please note  that voicemails left after 4:00 p.m. may not be returned until the following business day.  We are closed weekends and major holidays. You have access to a nurse at all times for urgent questions. Please call the main number to the clinic Dept: [phone] and follow the prompts.   For any non-urgent questions, you may also contact your provider using MyChart. We now offer e-Visits for anyone 65 and older to request care online  for non-urgent symptoms. For details visit [email or web address].   Also download the MyChart app! Go to the app store, search "MyChart", open the app, select [place], and log in with your MyChart username and password.  Oxaliplatin Injection What is this medication? OXALIPLATIN (ox AL i PLA tin) treats colorectal cancer. It works by slowing down the growth of cancer cells. This medicine may be used for other purposes; ask your health care provider or pharmacist if you have questions. COMMON BRAND NAME(S): Eloxatin What should I tell my care team before I take this medication? They need to know if you have any of these conditions: Heart disease History of irregular heartbeat or rhythm Liver disease Low blood cell levels (white cells, red cells, and platelets) Lung or breathing disease, such as asthma Take medications that treat or prevent blood clots Tingling of the fingers, toes, or other nerve disorder An unusual or allergic reaction to oxaliplatin, other medications, foods, dyes, or preservatives If you or your partner are pregnant or trying to get pregnant Breast-feeding How should I use this medication? This medication is injected into a vein. It is given by your care team in a hospital or clinic setting. Talk to your care team about the use of this medication in children. Special care may be needed. Overdosage: If you think you have taken too much of this medicine contact a poison control center or emergency room at once. NOTE: This medicine is only for you. Do not share this medicine with others. What if I miss a dose? Keep appointments for follow-up doses. It is important not to miss a dose. Call your care team if you are unable to keep an appointment. What may interact with this medication? Do not take this medication with any of the following: Cisapride Dronedarone Pimozide Thioridazine This medication may also interact with the following: Aspirin and aspirin-like  medications Certain medications that treat or prevent blood clots, such as warfarin, apixaban, dabigatran, and rivaroxaban Cisplatin Cyclosporine Diuretics Medications for infection, such as acyclovir, adefovir, amphotericin B, bacitracin, cidofovir, foscarnet, ganciclovir, gentamicin, pentamidine, vancomycin NSAIDs, medications for pain and inflammation, such as ibuprofen or naproxen Other medications that cause heart rhythm changes Pamidronate Zoledronic acid This list may not describe all possible interactions. Give your health care provider a list of all the medicines, herbs, non-prescription drugs, or dietary supplements you use. Also tell them if you smoke, drink alcohol, or use illegal drugs. Some items may interact with your medicine. What should I watch for while using this medication? Your condition will be monitored carefully while you are receiving this medication. You may need blood work while taking this medication. This medication may make you feel generally unwell. This is not uncommon as chemotherapy can affect healthy cells as well as cancer cells. Report any side effects. Continue your course of treatment even though you feel ill unless your care team tells you to stop. This medication may increase your risk of getting an infection. Call your care team for advice if you get a fever, chills, sore throat, or  other symptoms of a cold or flu. Do not treat yourself. Try to avoid being around people who are sick. Avoid taking medications that contain aspirin, acetaminophen, ibuprofen, naproxen, or ketoprofen unless instructed by your care team. These medications may hide a fever. Be careful brushing or flossing your teeth or using a toothpick because you may get an infection or bleed more easily. If you have any dental work done, tell your dentist you are receiving this medication. This medication can make you more sensitive to cold. Do not drink cold drinks or use ice. Cover exposed  skin before coming in contact with cold temperatures or cold objects. When out in cold weather wear warm clothing and cover your mouth and nose to warm the air that goes into your lungs. Tell your care team if you get sensitive to the cold. Talk to your care team if you or your partner are pregnant or think either of you might be pregnant. This medication can cause serious birth defects if taken during pregnancy and for 9 months after the last dose. A negative pregnancy test is required before starting this medication. A reliable form of contraception is recommended while taking this medication and for 9 months after the last dose. Talk to your care team about effective forms of contraception. Do not father a child while taking this medication and for 6 months after the last dose. Use a condom while having sex during this time period. Do not breastfeed while taking this medication and for 3 months after the last dose. This medication may cause infertility. Talk to your care team if you are concerned about your fertility. What side effects may I notice from receiving this medication? Side effects that you should report to your care team as soon as possible: Allergic reactions--skin rash, itching, hives, swelling of the face, lips, tongue, or throat Bleeding--bloody or black, tar-like stools, vomiting blood or brown material that looks like coffee grounds, red or dark brown urine, small red or purple spots on skin, unusual bruising or bleeding Dry cough, shortness of breath or trouble breathing Heart rhythm changes--fast or irregular heartbeat, dizziness, feeling faint or lightheaded, chest pain, trouble breathing Infection--fever, chills, cough, sore throat, wounds that don't heal, pain or trouble when passing urine, general feeling of discomfort or being unwell Liver injury--right upper belly pain, loss of appetite, nausea, light-colored stool, dark yellow or brown urine, yellowing skin or eyes, unusual  weakness or fatigue Low red blood cell level--unusual weakness or fatigue, dizziness, headache, trouble breathing Muscle injury--unusual weakness or fatigue, muscle pain, dark yellow or brown urine, decrease in amount of urine Pain, tingling, or numbness in the hands or feet Sudden and severe headache, confusion, change in vision, seizures, which may be signs of posterior reversible encephalopathy syndrome (PRES) Unusual bruising or bleeding Side effects that usually do not require medical attention (report to your care team if they continue or are bothersome): Diarrhea Nausea Pain, redness, or swelling with sores inside the mouth or throat Unusual weakness or fatigue Vomiting This list may not describe all possible side effects. Call your doctor for medical advice about side effects. You may report side effects to FDA at 1-800-FDA-1088. Where should I keep my medication? This medication is given in a hospital or clinic. It will not be stored at home. NOTE: This sheet is a summary. It may not cover all possible information. If you have questions about this medicine, talk to your doctor, pharmacist, or health care provider.  2024 Elsevier/Gold Standard (  2021-10-15 00:00:00)  Leucovorin Injection What is this medication? LEUCOVORIN (loo koe VOR in) prevents side effects from certain medications, such as methotrexate. It works by increasing folate levels. This helps protect healthy cells in your body. It may also be used to treat anemia caused by low levels of folate. It can also be used with fluorouracil, a type of chemotherapy, to treat colorectal cancer. It works by increasing the effects of fluorouracil in the body. This medicine may be used for other purposes; ask your health care provider or pharmacist if you have questions. What should I tell my care team before I take this medication? They need to know if you have any of these conditions: Anemia from low levels of vitamin B12 in the  blood An unusual or allergic reaction to leucovorin, folic acid, other medications, foods, dyes, or preservatives Pregnant or trying to get pregnant Breastfeeding How should I use this medication? This medication is injected into a vein or a muscle. It is given by your care team in a hospital or clinic setting. Talk to your care team about the use of this medication in children. Special care may be needed. Overdosage: If you think you have taken too much of this medicine contact a poison control center or emergency room at once. NOTE: This medicine is only for you. Do not share this medicine with others. What if I miss a dose? Keep appointments for follow-up doses. It is important not to miss your dose. Call your care team if you are unable to keep an appointment. What may interact with this medication? Capecitabine Fluorouracil Phenobarbital Phenytoin Primidone Trimethoprim;sulfamethoxazole This list may not describe all possible interactions. Give your health care provider a list of all the medicines, herbs, non-prescription drugs, or dietary supplements you use. Also tell them if you smoke, drink alcohol, or use illegal drugs. Some items may interact with your medicine. What should I watch for while using this medication? Your condition will be monitored carefully while you are receiving this medication. This medication may increase the side effects of 5-fluorouracil. Tell your care team if you have diarrhea or mouth sores that do not get better or that get worse. What side effects may I notice from receiving this medication? Side effects that you should report to your care team as soon as possible: Allergic reactions--skin rash, itching, hives, swelling of the face, lips, tongue, or throat This list may not describe all possible side effects. Call your doctor for medical advice about side effects. You may report side effects to FDA at 1-800-FDA-1088. Where should I keep my  medication? This medication is given in a hospital or clinic. It will not be stored at home. NOTE: This sheet is a summary. It may not cover all possible information. If you have questions about this medicine, talk to your doctor, pharmacist, or health care provider.  2024 Elsevier/Gold Standard (2021-06-04 00:00:00)  Fluorouracil Injection What is this medication? FLUOROURACIL (flure oh YOOR a sil) treats some types of cancer. It works by slowing down the growth of cancer cells. This medicine may be used for other purposes; ask your health care provider or pharmacist if you have questions. COMMON BRAND NAME(S): Adrucil What should I tell my care team before I take this medication? They need to know if you have any of these conditions: Blood disorders Dihydropyrimidine dehydrogenase (DPD) deficiency Infection, such as chickenpox, cold sores, herpes Kidney disease Liver disease Poor nutrition Recent or ongoing radiation therapy An unusual or allergic reaction to  fluorouracil, other medications, foods, dyes, or preservatives If you or your partner are pregnant or trying to get pregnant Breast-feeding How should I use this medication? This medication is injected into a vein. It is administered by your care team in a hospital or clinic setting. Talk to your care team about the use of this medication in children. Special care may be needed. Overdosage: If you think you have taken too much of this medicine contact a poison control center or emergency room at once. NOTE: This medicine is only for you. Do not share this medicine with others. What if I miss a dose? Keep appointments for follow-up doses. It is important not to miss your dose. Call your care team if you are unable to keep an appointment. What may interact with this medication? Do not take this medication with any of the following: Live virus vaccines This medication may also interact with the following: Medications that treat  or prevent blood clots, such as warfarin, enoxaparin, dalteparin This list may not describe all possible interactions. Give your health care provider a list of all the medicines, herbs, non-prescription drugs, or dietary supplements you use. Also tell them if you smoke, drink alcohol, or use illegal drugs. Some items may interact with your medicine. What should I watch for while using this medication? Your condition will be monitored carefully while you are receiving this medication. This medication may make you feel generally unwell. This is not uncommon as chemotherapy can affect healthy cells as well as cancer cells. Report any side effects. Continue your course of treatment even though you feel ill unless your care team tells you to stop. In some cases, you may be given additional medications to help with side effects. Follow all directions for their use. This medication may increase your risk of getting an infection. Call your care team for advice if you get a fever, chills, sore throat, or other symptoms of a cold or flu. Do not treat yourself. Try to avoid being around people who are sick. This medication may increase your risk to bruise or bleed. Call your care team if you notice any unusual bleeding. Be careful brushing or flossing your teeth or using a toothpick because you may get an infection or bleed more easily. If you have any dental work done, tell your dentist you are receiving this medication. Avoid taking medications that contain aspirin, acetaminophen, ibuprofen, naproxen, or ketoprofen unless instructed by your care team. These medications may hide a fever. Do not treat diarrhea with over the counter products. Contact your care team if you have diarrhea that lasts more than 2 days or if it is severe and watery. This medication can make you more sensitive to the sun. Keep out of the sun. If you cannot avoid being in the sun, wear protective clothing and sunscreen. Do not use sun lamps,  tanning beds, or tanning booths. Talk to your care team if you or your partner wish to become pregnant or think you might be pregnant. This medication can cause serious birth defects if taken during pregnancy and for 3 months after the last dose. A reliable form of contraception is recommended while taking this medication and for 3 months after the last dose. Talk to your care team about effective forms of contraception. Do not father a child while taking this medication and for 3 months after the last dose. Use a condom while having sex during this time period. Do not breastfeed while taking this medication. This medication  may cause infertility. Talk to your care team if you are concerned about your fertility. What side effects may I notice from receiving this medication? Side effects that you should report to your care team as soon as possible: Allergic reactions--skin rash, itching, hives, swelling of the face, lips, tongue, or throat Heart attack--pain or tightness in the chest, shoulders, arms, or jaw, nausea, shortness of breath, cold or clammy skin, feeling faint or lightheaded Heart failure--shortness of breath, swelling of the ankles, feet, or hands, sudden weight gain, unusual weakness or fatigue Heart rhythm changes--fast or irregular heartbeat, dizziness, feeling faint or lightheaded, chest pain, trouble breathing High ammonia level--unusual weakness or fatigue, confusion, loss of appetite, nausea, vomiting, seizures Infection--fever, chills, cough, sore throat, wounds that don't heal, pain or trouble when passing urine, general feeling of discomfort or being unwell Low red blood cell level--unusual weakness or fatigue, dizziness, headache, trouble breathing Pain, tingling, or numbness in the hands or feet, muscle weakness, change in vision, confusion or trouble speaking, loss of balance or coordination, trouble walking, seizures Redness, swelling, and blistering of the skin over hands and  feet Severe or prolonged diarrhea Unusual bruising or bleeding Side effects that usually do not require medical attention (report to your care team if they continue or are bothersome): Dry skin Headache Increased tears Nausea Pain, redness, or swelling with sores inside the mouth or throat Sensitivity to light Vomiting This list may not describe all possible side effects. Call your doctor for medical advice about side effects. You may report side effects to FDA at 1-800-FDA-1088. Where should I keep my medication? This medication is given in a hospital or clinic. It will not be stored at home. NOTE: This sheet is a summary. It may not cover all possible information. If you have questions about this medicine, talk to your doctor, pharmacist, or health care provider.  2024 Elsevier/Gold Standard (2021-05-07 00:00:00)

## 2022-08-18 NOTE — Addendum Note (Signed)
Addended by: Lonna Cobb K on: 08/18/2022 10:20 AM   Modules accepted: Orders

## 2022-08-18 NOTE — Progress Notes (Signed)
Patient seen by Lonna Cobb NP today  Vitals are within treatment parameters.  Labs reviewed by Lonna Cobb NP CBC diff reviewed and within treatment parameters, CMP pending.  Per physician team, patient is ready for treatment. Please note that modifications are being made to the treatment plan including pre-meds added today: Benadryl and pepcid. Oxaliplatin will be diluted and be given over 4 hours per Lonna Cobb, NP.

## 2022-08-19 ENCOUNTER — Other Ambulatory Visit: Payer: Self-pay

## 2022-08-19 DIAGNOSIS — C19 Malignant neoplasm of rectosigmoid junction: Secondary | ICD-10-CM

## 2022-08-20 ENCOUNTER — Inpatient Hospital Stay: Payer: 59

## 2022-08-20 VITALS — BP 172/98 | HR 83 | Temp 98.1°F | Resp 18

## 2022-08-20 DIAGNOSIS — Z452 Encounter for adjustment and management of vascular access device: Secondary | ICD-10-CM

## 2022-08-20 DIAGNOSIS — C19 Malignant neoplasm of rectosigmoid junction: Secondary | ICD-10-CM

## 2022-08-20 DIAGNOSIS — Z5111 Encounter for antineoplastic chemotherapy: Secondary | ICD-10-CM | POA: Diagnosis not present

## 2022-08-20 MED ORDER — SODIUM CHLORIDE 0.9% FLUSH
10.0000 mL | INTRAVENOUS | Status: DC | PRN
  Administered 2022-08-20: 10 mL

## 2022-08-20 MED ORDER — HEPARIN SOD (PORK) LOCK FLUSH 100 UNIT/ML IV SOLN
250.0000 [IU] | Freq: Once | INTRAVENOUS | Status: AC | PRN
  Administered 2022-08-20: 250 [IU]

## 2022-08-20 MED ORDER — HEPARIN SOD (PORK) LOCK FLUSH 100 UNIT/ML IV SOLN
250.0000 [IU] | Freq: Once | INTRAVENOUS | Status: AC
  Administered 2022-08-20: 250 [IU] via INTRAVENOUS

## 2022-08-20 MED ORDER — SODIUM CHLORIDE 0.9 % IV SOLN: Freq: Once | INTRAVENOUS | Status: AC

## 2022-08-20 MED ORDER — PEGFILGRASTIM-CBQV 6 MG/0.6ML ~~LOC~~ SOSY
6.0000 mg | PREFILLED_SYRINGE | Freq: Once | SUBCUTANEOUS | Status: AC
  Administered 2022-08-20: 6 mg via SUBCUTANEOUS
  Filled 2022-08-20: qty 0.6

## 2022-08-20 MED ORDER — SODIUM CHLORIDE 0.9% FLUSH
10.0000 mL | Freq: Once | INTRAVENOUS | Status: AC
  Administered 2022-08-20: 10 mL via INTRAVENOUS

## 2022-08-20 NOTE — Patient Instructions (Signed)

## 2022-08-27 ENCOUNTER — Telehealth: Payer: Self-pay

## 2022-08-27 ENCOUNTER — Inpatient Hospital Stay: Payer: 59

## 2022-08-27 ENCOUNTER — Encounter: Payer: Self-pay | Admitting: *Deleted

## 2022-08-27 VITALS — BP 150/120 | HR 89 | Temp 97.5°F | Wt 150.3 lb

## 2022-08-27 DIAGNOSIS — C20 Malignant neoplasm of rectum: Secondary | ICD-10-CM

## 2022-08-27 DIAGNOSIS — Z5111 Encounter for antineoplastic chemotherapy: Secondary | ICD-10-CM | POA: Diagnosis not present

## 2022-08-27 DIAGNOSIS — Z95828 Presence of other vascular implants and grafts: Secondary | ICD-10-CM

## 2022-08-27 MED ORDER — HEPARIN SOD (PORK) LOCK FLUSH 100 UNIT/ML IV SOLN
500.0000 [IU] | Freq: Once | INTRAVENOUS | Status: AC
  Administered 2022-08-27: 500 [IU]

## 2022-08-27 MED ORDER — SODIUM CHLORIDE 0.9% FLUSH
10.0000 mL | Freq: Once | INTRAVENOUS | Status: AC
  Administered 2022-08-27: 10 mL

## 2022-08-27 NOTE — Progress Notes (Signed)
Late note: Lonna Cobb, NP notified that patient blood pressure is elevated 150/120 manual. Pt reports not taking her blood pressure medication for at least 2 days.

## 2022-08-27 NOTE — Telephone Encounter (Signed)
We received a fax from the [facility] stating the pts. BP today was 150/120 and they reported she is not taking her BP meds. I will send fax back to you in your folder.

## 2022-08-27 NOTE — Progress Notes (Signed)
BP today 150/120 and patient tells infusion nurse that she has not taken her BP meds in a few days. She is not symptomatic. Faxed reading to PCP (Dr. Huntley Dec Early) and followed up with call. Nurse will relay this to Dr. Arbie Cookey and they will call Tine today. April [name] notified and informed nurse she will not agree to go to emergency room. Encouraged her to take her BP med as soon as she gets home.

## 2022-09-03 ENCOUNTER — Inpatient Hospital Stay: Payer: 59

## 2022-09-03 VITALS — BP 147/98 | HR 100 | Temp 98.1°F | Resp 18

## 2022-09-03 DIAGNOSIS — Z5111 Encounter for antineoplastic chemotherapy: Secondary | ICD-10-CM | POA: Diagnosis not present

## 2022-09-03 DIAGNOSIS — Z452 Encounter for adjustment and management of vascular access device: Secondary | ICD-10-CM

## 2022-09-03 MED ORDER — HEPARIN SOD (PORK) LOCK FLUSH 100 UNIT/ML IV SOLN
250.0000 [IU] | Freq: Once | INTRAVENOUS | Status: AC
  Administered 2022-09-03: 250 [IU] via INTRAVENOUS

## 2022-09-03 MED ORDER — SODIUM CHLORIDE 0.9% FLUSH
10.0000 mL | Freq: Once | INTRAVENOUS | Status: AC
  Administered 2022-09-03: 10 mL via INTRAVENOUS

## 2022-09-07 ENCOUNTER — Other Ambulatory Visit: Payer: Self-pay | Admitting: Oncology

## 2022-09-07 DIAGNOSIS — C19 Malignant neoplasm of rectosigmoid junction: Secondary | ICD-10-CM

## 2022-09-08 ENCOUNTER — Inpatient Hospital Stay: Payer: 59

## 2022-09-08 ENCOUNTER — Other Ambulatory Visit: Payer: Self-pay | Admitting: *Deleted

## 2022-09-08 ENCOUNTER — Inpatient Hospital Stay: Payer: 59 | Admitting: Oncology

## 2022-09-08 ENCOUNTER — Other Ambulatory Visit (HOSPITAL_BASED_OUTPATIENT_CLINIC_OR_DEPARTMENT_OTHER): Payer: Self-pay

## 2022-09-08 ENCOUNTER — Telehealth: Payer: Self-pay | Admitting: *Deleted

## 2022-09-08 VITALS — BP 142/89 | HR 100 | Temp 98.1°F | Resp 18 | Ht 64.0 in | Wt 146.0 lb

## 2022-09-08 DIAGNOSIS — C20 Malignant neoplasm of rectum: Secondary | ICD-10-CM

## 2022-09-08 DIAGNOSIS — C19 Malignant neoplasm of rectosigmoid junction: Secondary | ICD-10-CM

## 2022-09-08 DIAGNOSIS — C786 Secondary malignant neoplasm of retroperitoneum and peritoneum: Secondary | ICD-10-CM

## 2022-09-08 DIAGNOSIS — Z5111 Encounter for antineoplastic chemotherapy: Secondary | ICD-10-CM | POA: Diagnosis not present

## 2022-09-08 LAB — CBC WITH DIFFERENTIAL (CANCER CENTER ONLY)
Abs Immature Granulocytes: 0.03 10*3/uL (ref 0.00–0.07)
Basophils Absolute: 0.1 10*3/uL (ref 0.0–0.1)
Basophils Relative: 1 %
Eosinophils Absolute: 0.5 10*3/uL (ref 0.0–0.5)
Eosinophils Relative: 7 %
HCT: 41 % (ref 36.0–46.0)
Hemoglobin: 13.7 g/dL (ref 12.0–15.0)
Immature Granulocytes: 0 %
Lymphocytes Relative: 31 %
Lymphs Abs: 2.3 10*3/uL (ref 0.7–4.0)
MCH: 31.6 pg (ref 26.0–34.0)
MCHC: 33.4 g/dL (ref 30.0–36.0)
MCV: 94.7 fL (ref 80.0–100.0)
Monocytes Absolute: 0.8 10*3/uL (ref 0.1–1.0)
Monocytes Relative: 11 %
Neutro Abs: 3.7 10*3/uL (ref 1.7–7.7)
Neutrophils Relative %: 50 %
Platelet Count: 253 10*3/uL (ref 150–400)
RBC: 4.33 MIL/uL (ref 3.87–5.11)
RDW: 15.7 % — ABNORMAL HIGH (ref 11.5–15.5)
WBC Count: 7.3 10*3/uL (ref 4.0–10.5)
nRBC: 0 % (ref 0.0–0.2)

## 2022-09-08 LAB — CMP (CANCER CENTER ONLY)
ALT: 12 U/L (ref 0–44)
AST: 20 U/L (ref 15–41)
Albumin: 4.4 g/dL (ref 3.5–5.0)
Alkaline Phosphatase: 153 U/L — ABNORMAL HIGH (ref 38–126)
Anion gap: 10 (ref 5–15)
BUN: 6 mg/dL (ref 6–20)
CO2: 29 mmol/L (ref 22–32)
Calcium: 9.5 mg/dL (ref 8.9–10.3)
Chloride: 100 mmol/L (ref 98–111)
Creatinine: 0.52 mg/dL (ref 0.44–1.00)
GFR, Estimated: >60 mL/min (ref >60)
Glucose, Bld: 90 mg/dL (ref 70–99)
Potassium: 3.1 mmol/L — ABNORMAL LOW (ref 3.5–5.1)
Sodium: 139 mmol/L (ref 135–145)
Total Bilirubin: 0.4 mg/dL (ref 0.3–1.2)
Total Protein: 8.1 g/dL (ref 6.5–8.1)

## 2022-09-08 LAB — CEA (ACCESS): CEA (CHCC): 2.66 ng/mL (ref 0.00–5.00)

## 2022-09-08 LAB — MAGNESIUM: Magnesium: 1.9 mg/dL (ref 1.7–2.4)

## 2022-09-08 MED ORDER — HYDROCODONE-ACETAMINOPHEN 10-325 MG PO TABS
1.0000 | ORAL_TABLET | Freq: Four times a day (QID) | ORAL | 0 refills | Status: DC | PRN
Start: 2022-09-08 — End: 2022-10-20
  Filled 2022-09-08: qty 60, 8d supply, fill #0

## 2022-09-08 MED ORDER — HEPARIN SOD (PORK) LOCK FLUSH 100 UNIT/ML IV SOLN
500.0000 [IU] | Freq: Once | INTRAVENOUS | Status: AC
  Administered 2022-09-08: 250 [IU] via INTRAVENOUS

## 2022-09-08 MED ORDER — SODIUM CHLORIDE 0.9% FLUSH
10.0000 mL | Freq: Once | INTRAVENOUS | Status: AC
  Administered 2022-09-08: 10 mL via INTRAVENOUS

## 2022-09-08 MED ORDER — POTASSIUM CHLORIDE ER 10 MEQ PO CPCR: 20.0000 meq | ORAL_CAPSULE | Freq: Two times a day (BID) | ORAL | 2 refills | Status: DC

## 2022-09-08 NOTE — Telephone Encounter (Signed)
Informed patient that K+ today is 3.1 and Dr. Truett Perna wants he to increase to 20 meq bid. New script sent in for her today. Also ordered a Mg+ level be added on today.

## 2022-09-08 NOTE — Progress Notes (Signed)
[facility] OFFICE PROGRESS NOTE   Diagnosis: Colon cancer  INTERVAL HISTORY:   April [name] completed on cycle of FOLFOX on 08/18/2022.  She reports no rash or pruritus with this cycle of chemotherapy.  She had nausea following chemotherapy.  This is improved with Decadron.  No neuropathy symptoms.  She reports rectal pain for the past few days.  She last had a bowel movement approximately 1 week ago.  She had a daily bowel movement during the week following chemotherapy.  Hydrocodone does not relieve the pain.  Objective:  Vital signs in last 24 hours:  Blood pressure (!) 142/89, pulse 100, temperature 98.1 F (36.7 C), temperature source Oral, resp. rate 18, height 5\' 4"  (1.626 m), weight 146 lb (66.2 kg), last menstrual period 01/07/2006, SpO2 100%.    HEENT: No thrush or ulcers Resp: Lungs clear bilaterally Cardio: Regular rate and rhythm GI: No hepatosplenomegaly, no mass, soft, nontender Vascular: No leg edema Neuro: Very mild loss of vibratory sense at the fingertips bilaterally   Portacath/PICC-without erythema  Lab Results:  Lab Results  Component Value Date   WBC 7.3 09/08/2022   HGB 13.7 09/08/2022   HCT 41.0 09/08/2022   MCV 94.7 09/08/2022   PLT 253 09/08/2022   NEUTROABS 3.7 09/08/2022    CMP  Lab Results  Component Value Date   NA 139 08/18/2022   K 3.4 (L) 08/18/2022   CL 103 08/18/2022   CO2 26 08/18/2022   GLUCOSE 96 08/18/2022   BUN 8 08/18/2022   CREATININE 0.50 08/18/2022   CALCIUM 9.3 08/18/2022   PROT 7.9 08/18/2022   ALBUMIN 4.2 08/18/2022   AST 16 08/18/2022   ALT 9 08/18/2022   ALKPHOS 179 (H) 08/18/2022   BILITOT 0.2 (L) 08/18/2022   GFRNONAA >60 08/18/2022   GFRAA >60 10/17/2019    Lab Results  Component Value Date   CEA1 <0.6 03/23/2022   CEA 2.66 08/18/2022   CAN199 213 (H) 03/23/2022    Lab Results  Component Value Date   INR 1.0 10/22/2020   LABPROT 13.6 10/22/2020    Imaging:  No results  found.  Medications: I have reviewed the patient's current medications.   Assessment/Plan:  Sigmoid colon cancer, stage IV (pT4a,pN2b,M1c) Colonoscopy 03/24/2019-3 rectal polyps-hyperplastic polyps, distal colon biopsy-at least intramucosal adenocarcinoma, completely obstructing mass in the distal sigmoid colon, could not be traversed, intact mismatch repair protein expression 03/24/2019-CEA 56.1 03/29/2019 CT abdomen/pelvis-circumferential thickening involving the entire mid and distal sigmoid colon to the level of the rectosigmoid junction, solid/cystic mass in the left ovary, bilateral nephrolithiasis Robotic assisted low anterior resection, mesenteric lymphadenectomy, bilateral salpingo-oophorectomy 04/08/2019 Pathology ([facility] review of outside pathology) metastatic adenocarcinoma involving the peritoneum overlying the round ligament, serosa of the urinary bladder, serosa of the right ureter a sacral area, pelvic peritoneum, and left ovary.  Omental biopsy with focal mucin pools with no carcinoma cells identified, right hemidiaphragm biopsy involved by metastatic adenocarcinoma, invasive adenocarcinoma the sigmoid colon, moderately differentiated, T4a, perineural and vascular invasion present, 7/22 lymph nodes, multiple tumor deposits, resection margins negative Negative for PD-L1, low probability of MSI-high, HER-2 negative, negative for BRAF, NRAS and KRAS alterations CTs 05/19/2019-no evidence of metastatic disease, findings suspicious for colitis of the transverse and ascending colon, small amount of ascites in the cul-de-sac, bilateral renal calculi Cycle 1 FOLFIRI 05/24/2019, bevacizumab added with cycle 2 Cycle 4 FOLFIRI/bevacizumab 07/05/2019 Cycle 5 FOLFIRI 07/27/2019, bevacizumab held secondary to hypertension Cycle 6 FOLFIRI 08/10/1999, bevacizumab held secondary to hypertension  CTs 08/30/2019-no evidence of recurrent disease, new subsolid right lower lobe nodule felt to be inflammatory,  emphysema Cycle 7 FOLFIRI 09/26/2019, bevacizumab remains on hold secondary to hypertension Cycle 8 FOLFIRI 10/17/2019, bevacizumab held secondary to hypertension, Udenyca added for neutropenia Cycle 9 FOLFIRI 11/07/2019, bevacizumab held secondary to hypertension, Udenyca Cycle 10 FOLFIRI 11/28/2019, bevacizumab held, Udenyca Cycle 11 FOLFIRI 12/26/2019, bevacizumab held, Udenyca CTs 01/25/2020-no evidence of recurrent disease, resolution of right lower lobe nodule Maintenance Xeloda beginning 02/06/2020 CTs 04/25/2020- no evidence of metastatic disease, multiple bilateral renal calculi without hydronephrosis Maintenance Xeloda continued CT abdomen/pelvis without contrast 08/10/2020-bilateral staghorn renal calculi, no evidence of metastatic disease; addendum 08/23/2020-small but increasing omental nodules. Cycle 1 FOLFIRI/panitumumab 09/19/2020 Cycle 2 FOLFIRI/Panitumumab 10/03/2020 Cycle 3 FOLFIRI/Panitumumab 10/17/2020 Cycle 4 FOLFIRI/panitumumab 10/31/2020 Cycle 5 FOLFIRI/Panitumumab 11/14/2020 CTs 11/26/2020-stable omental metastases compared to 10/22/2020, mildly decreased from 08/10/2020.  Left lower quadrant tiny paracolic gutter implant slightly decreased from CT 08/10/2020.  No new or progressive metastatic disease in the abdomen or pelvis. Cycle 6 FOLFIRI/Panitumumab 11/28/2020, irinotecan dose reduced, treatment schedule adjusted to every 3 weeks going forward Cycle 7 FOLFIRI/panitumumab 12/24/2020 Cycle 8 FOLFIRI/Panitumumab 01/16/2021--treatment held due to hypertension in the infusion area. Cycle 8 FOLFIRI/panitumumab 01/21/2021 Cycle 9 FOLFIRI/panitumumab 02/11/2021 Cycle 10 FOLFIRI/Panitumumab 03/04/2021 Cycle 11 FOLFIRI/Panitumumab 03/25/2021 CT abdomen/pelvis 04/10/2021-stable right omental and left posterior paracolic gutter nodules Cycle 12 FOLFIRI/panitumumab 04/15/2021 Cycle 13 FOLFIRI/panitumumab 05/06/2021 Cycle 14 FOLFIRI/Panitumumab 05/27/2021 Cycle 15 FOLFIRI/Panitumumab 06/17/2021 CT  abdomen/pelvis 07/05/2021-slight enlargement of a dominant right omental implant, no new implants Patient requested a treatment break CT abdomen/pelvis 10/04/2021-mild increase in size of multiple omental soft tissue nodules, 4 mm calculus in the distal left ureter adjacent to the ureteral stent Cycle 1 Lonsurf 10/14/2021 10/28/2021 Avastin every 2 weeks Cycle 2 Lonsurf 11/11/2021 Cycle 3 Lonsurf 12/09/2021 Cycle 4 Lonsurf 01/06/2022 Avastin held 01/20/2022 due to proteinuria, 24-hour urine 43 mg CT/pelvis 01/30/2022-possible new peritoneal implant at the transverse colon, no significant change in other omental/peritoneal implants, stable chronic rectal wall thickening, status post removal of double-J left ureteral stent with no evidence of hydronephrosis or ureteral calculus, nonobstructing bilateral renal calculi Cycle 5 Lonsurf 02/05/2022 or 02/06/2022 Avastin every 2 weeks Cycle 6 Lonsurf 03/03/2022 CTs 04/05/2022-increase in size and number of peritoneal implants compared to January 2024 central liver mass narrowing the anterior branch of the right portal vein, decreased biliary duct dilation, biliary stents in place, stable right cardiophrenic lymph nodes, separate pigtail stent in the lumen of the proximal duodenum Cycle 1 FOLFOX 04/14/2022 CT abdomen/pelvis 04/16/2022-stent within the third portion of the duodenum, stable peritoneal nodules, indwelling biliary stents with mild left biliary duct dilation Cycle 2 FOLFOX 04/28/2022, Emend and prophylactic dexamethasone added Cycle 3 FOLFOX 05/12/2022, Aloxi, Emend, prophylactic dexamethasone, Compazine and lorazepam as needed Chemotherapy held 05/25/2020 due to severe hypertension, evaluated in the emergency department Cycle 4 FOLFOX 06/03/2022 Cycle 5 FOLFOX 06/16/2022, oxaliplatin dose reduced and 5-FU bolus eliminated due to neutropenia CT 06/25/2022-unchanged soft tissue at the hepatic hilum, unchanged, bile duct stent, decreased size of peritoneal and  omental nodules and diminished volume of likely fluid in the lower left pelvis Cycle 6 FOLFOX 06/30/2022 Cycle 7 FOLFOX 07/21/2022 CT 07/24/2022 (in the emergency department with nausea, headache, abdominal pain)-no acute pathology.  No significant interval change in omental nodularity. Cycle 8 FOLFOX 08/04/2022, pruritus on the palms at the end of the oxaliplatin infusion, Pepcid and Benadryl given, symptoms resolved, infusion not resumed Cycle 9 FOLFOX 08/18/2022-Pepcid and Benadryl added to premedications, Oxaliplatin diluted in  a larger volume and infusion time increased Cycle 10 FOLFOX 09/08/2022   Hypertension G4 P3, twins Kidney stones-bilateral staghorn renal calculi on CT 08/10/2020 Infected Port-A-Cath 09/12/2019-placed on Augmentin, referred for Port-A-Cath removal; Port-A-Cath removed 09/14/2019; PICC line placed 09/14/2019; culture staph aureus.  Course of Septra completed. Neutropenia secondary to chemotherapy-Udenyca added with cycle 8 FOLFIRI Right nephrostomy tube 09/12/2020; stent placed 10/09/2020, percutaneous nephrostolithotomy treatment of right-sided kidney stones 10/09/2020, malpositioned right ureter stent replacement 10/23/2020, right ureter stent removed 10/29/2020 Percutaneous left nephrostolithotomy 10/01/2021-no renal stone identified, left ureter stent left in place Cystoscopy 10/14/2021-stent removed 8.  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  Admission 04/16/2022 with nausea/vomiting and abdominal  pain, felt to be acute toxicity related to chemotherapy, discharged home 04/18/2022   Disposition: April [name] appears unchanged.  The etiology of the rectal discomfort is unclear.  The pain could be related to constipation, hemorrhoids, or tumor.  She declined a rectal exam today.  She will try MiraLAX and a stool softener.  She will also try a hemorrhoid suppository.  She will call if the pain has not improved over the next few days.  She will complete another cycle of FOLFOX tomorrow.  She will use hydrocodone, 10-20 mg, every 6 hours as needed for pain.  She will return for an office visit and chemotherapy in 2 weeks.  We will plan for a restaging CT evaluation in September.  Thornton Papas, MD  09/08/2022  9:49 AM

## 2022-09-09 ENCOUNTER — Inpatient Hospital Stay: Payer: 59 | Admitting: Licensed Clinical Social Worker

## 2022-09-09 ENCOUNTER — Inpatient Hospital Stay: Payer: 59

## 2022-09-09 ENCOUNTER — Other Ambulatory Visit: Payer: Self-pay

## 2022-09-09 VITALS — BP 160/96 | HR 88 | Temp 98.1°F | Resp 100

## 2022-09-09 DIAGNOSIS — C19 Malignant neoplasm of rectosigmoid junction: Secondary | ICD-10-CM

## 2022-09-09 DIAGNOSIS — Z5111 Encounter for antineoplastic chemotherapy: Secondary | ICD-10-CM | POA: Diagnosis not present

## 2022-09-09 MED ORDER — OXALIPLATIN CHEMO INJECTION 100 MG/20ML
65.0000 mg/m2 | Freq: Once | INTRAVENOUS | Status: AC
  Administered 2022-09-09: 115 mg via INTRAVENOUS
  Filled 2022-09-09: qty 20

## 2022-09-09 MED ORDER — DIPHENHYDRAMINE HCL 50 MG/ML IJ SOLN
50.0000 mg | Freq: Once | INTRAMUSCULAR | Status: AC
  Administered 2022-09-09: 50 mg via INTRAVENOUS
  Filled 2022-09-09: qty 1

## 2022-09-09 MED ORDER — DEXTROSE 5 % IV SOLN: Freq: Once | INTRAVENOUS | Status: AC

## 2022-09-09 MED ORDER — SODIUM CHLORIDE 0.9 % IV SOLN
3100.0000 mg | INTRAVENOUS | Status: DC
  Administered 2022-09-09: 3100 mg via INTRAVENOUS
  Filled 2022-09-09: qty 62

## 2022-09-09 MED ORDER — FLUOROURACIL CHEMO INJECTION 2.5 GM/50ML
400.0000 mg/m2 | Freq: Once | INTRAVENOUS | Status: AC
  Administered 2022-09-09: 700 mg via INTRAVENOUS
  Filled 2022-09-09: qty 14

## 2022-09-09 MED ORDER — FAMOTIDINE IN NACL 20-0.9 MG/50ML-% IV SOLN
20.0000 mg | Freq: Once | INTRAVENOUS | Status: AC
  Administered 2022-09-09: 20 mg via INTRAVENOUS
  Filled 2022-09-09: qty 50

## 2022-09-09 MED ORDER — SODIUM CHLORIDE 0.9 % IV SOLN
10.0000 mg | Freq: Once | INTRAVENOUS | Status: AC
  Administered 2022-09-09: 10 mg via INTRAVENOUS
  Filled 2022-09-09: qty 10

## 2022-09-09 MED ORDER — LEUCOVORIN CALCIUM INJECTION 350 MG
400.0000 mg/m2 | Freq: Once | INTRAVENOUS | Status: AC
  Administered 2022-09-09: 704 mg via INTRAVENOUS
  Filled 2022-09-09: qty 35.2

## 2022-09-09 MED ORDER — PALONOSETRON HCL INJECTION 0.25 MG/5ML
0.2500 mg | Freq: Once | INTRAVENOUS | Status: AC
  Administered 2022-09-09: 0.25 mg via INTRAVENOUS
  Filled 2022-09-09: qty 5

## 2022-09-09 MED ORDER — SODIUM CHLORIDE 0.9 % IV SOLN
150.0000 mg | Freq: Once | INTRAVENOUS | Status: AC
  Administered 2022-09-09: 150 mg via INTRAVENOUS
  Filled 2022-09-09: qty 150

## 2022-09-09 NOTE — Patient Instructions (Addendum)
[place] [facility] The chemotherapy medication bag should finish at 46 hours, 96 hours, or 7 days. For example, if your pump is scheduled for 46 hours and it was put on at 4:00 p.m., it should finish at 2:00 p.m. the day it is scheduled to come off regardless of your appointment time.     Estimated time to finish at 12:30pm Thursday September 11, 2022.   If the display on your pump reads "Low Volume" and it is beeping, take the batteries out of the pump and come to the cancer center for it to be taken off.   If the pump alarms go off prior to the pump reading "Low Volume" then call [phone] and someone can assist you.  If the plunger comes out and the chemotherapy medication is leaking out, please use your home chemo spill kit to clean up the spill. Do NOT use paper towels or other household products.  If you have problems or questions regarding your pump, please call either [phone] (24 hours a day) or the cancer center Monday-Friday 8:00 a.m.- 4:30 p.m. at the clinic number and we will assist you. If you are unable to get assistance, then go to the nearest Emergency Department and ask the staff to contact the IV team for assistance.    Discharge Instructions: Thank you for choosing [facility] to provide your oncology and hematology care.   If you have a lab appointment with the Cancer Center, please go directly to the Cancer Center and check in at the registration area.   Wear comfortable clothing and clothing appropriate for easy access to any Portacath or PICC line.   We strive to give you quality time with your provider. You may need to reschedule your appointment if you arrive late (15 or more minutes).  Arriving late affects you and other patients whose appointments are after yours.  Also, if you miss three or more appointments without notifying the office, you may be dismissed from the clinic at the provider's discretion.      For  prescription refill requests, have your pharmacy contact our office and allow 72 hours for refills to be completed.    Today you received the following chemotherapy and/or immunotherapy agents Oxaliplatin, Leucovorin, Fluorouracil.      To help prevent nausea and vomiting after your treatment, we encourage you to take your nausea medication as directed.  BELOW ARE SYMPTOMS THAT SHOULD BE REPORTED IMMEDIATELY: *FEVER GREATER THAN 100.4 F (38 C) OR HIGHER *CHILLS OR SWEATING *NAUSEA AND VOMITING THAT IS NOT CONTROLLED WITH YOUR NAUSEA MEDICATION *UNUSUAL SHORTNESS OF BREATH *UNUSUAL BRUISING OR BLEEDING *URINARY PROBLEMS (pain or burning when urinating, or frequent urination) *BOWEL PROBLEMS (unusual diarrhea, constipation, pain near the anus) TENDERNESS IN MOUTH AND THROAT WITH OR WITHOUT PRESENCE OF ULCERS (sore throat, sores in mouth, or a toothache) UNUSUAL RASH, SWELLING OR PAIN  UNUSUAL VAGINAL DISCHARGE OR ITCHING   Items with * indicate a potential emergency and should be followed up as soon as possible or go to the Emergency Department if any problems should occur.  Please show the CHEMOTHERAPY ALERT CARD or IMMUNOTHERAPY ALERT CARD at check-in to the Emergency Department and triage nurse.  Should you have questions after your visit or need to cancel or reschedule your appointment, please contact [place] [facility]  Dept: [phone]  and follow the prompts.  Office hours are 8:00 a.m. to 4:30 p.m. Monday - Friday. Please note  that voicemails left after 4:00 p.m. may not be returned until the following business day.  We are closed weekends and major holidays. You have access to a nurse at all times for urgent questions. Please call the main number to the clinic Dept: [phone] and follow the prompts.   For any non-urgent questions, you may also contact your provider using MyChart. We now offer e-Visits for anyone 64 and older to request care online  for non-urgent symptoms. For details visit [email or web address].   Also download the MyChart app! Go to the app store, search "MyChart", open the app, select , and log in with your MyChart username and password.  Oxaliplatin Injection What is this medication? OXALIPLATIN (ox AL i PLA tin) treats colorectal cancer. It works by slowing down the growth of cancer cells. This medicine may be used for other purposes; ask your health care provider or pharmacist if you have questions. COMMON BRAND NAME(S): Eloxatin What should I tell my care team before I take this medication? They need to know if you have any of these conditions: Heart disease History of irregular heartbeat or rhythm Liver disease Low blood cell levels (white cells, red cells, and platelets) Lung or breathing disease, such as asthma Take medications that treat or prevent blood clots Tingling of the fingers, toes, or other nerve disorder An unusual or allergic reaction to oxaliplatin, other medications, foods, dyes, or preservatives If you or your partner are pregnant or trying to get pregnant Breast-feeding How should I use this medication? This medication is injected into a vein. It is given by your care team in a hospital or clinic setting. Talk to your care team about the use of this medication in children. Special care may be needed. Overdosage: If you think you have taken too much of this medicine contact a poison control center or emergency room at once. NOTE: This medicine is only for you. Do not share this medicine with others. What if I miss a dose? Keep appointments for follow-up doses. It is important not to miss a dose. Call your care team if you are unable to keep an appointment. What may interact with this medication? Do not take this medication with any of the following: Cisapride Dronedarone Pimozide Thioridazine This medication may also interact with the following: Aspirin and aspirin-like  medications Certain medications that treat or prevent blood clots, such as warfarin, apixaban, dabigatran, and rivaroxaban Cisplatin Cyclosporine Diuretics Medications for infection, such as acyclovir, adefovir, amphotericin B, bacitracin, cidofovir, foscarnet, ganciclovir, gentamicin, pentamidine, vancomycin NSAIDs, medications for pain and inflammation, such as ibuprofen or naproxen Other medications that cause heart rhythm changes Pamidronate Zoledronic acid This list may not describe all possible interactions. Give your health care provider a list of all the medicines, herbs, non-prescription drugs, or dietary supplements you use. Also tell them if you smoke, drink alcohol, or use illegal drugs. Some items may interact with your medicine. What should I watch for while using this medication? Your condition will be monitored carefully while you are receiving this medication. You may need blood work while taking this medication. This medication may make you feel generally unwell. This is not uncommon as chemotherapy can affect healthy cells as well as cancer cells. Report any side effects. Continue your course of treatment even though you feel ill unless your care team tells you to stop. This medication may increase your risk of getting an infection. Call your care team for advice if you get a fever, chills, sore throat, or  other symptoms of a cold or flu. Do not treat yourself. Try to avoid being around people who are sick. Avoid taking medications that contain aspirin, acetaminophen, ibuprofen, naproxen, or ketoprofen unless instructed by your care team. These medications may hide a fever. Be careful brushing or flossing your teeth or using a toothpick because you may get an infection or bleed more easily. If you have any dental work done, tell your dentist you are receiving this medication. This medication can make you more sensitive to cold. Do not drink cold drinks or use ice. Cover exposed  skin before coming in contact with cold temperatures or cold objects. When out in cold weather wear warm clothing and cover your mouth and nose to warm the air that goes into your lungs. Tell your care team if you get sensitive to the cold. Talk to your care team if you or your partner are pregnant or think either of you might be pregnant. This medication can cause serious birth defects if taken during pregnancy and for 9 months after the last dose. A negative pregnancy test is required before starting this medication. A reliable form of contraception is recommended while taking this medication and for 9 months after the last dose. Talk to your care team about effective forms of contraception. Do not father a child while taking this medication and for 6 months after the last dose. Use a condom while having sex during this time period. Do not breastfeed while taking this medication and for 3 months after the last dose. This medication may cause infertility. Talk to your care team if you are concerned about your fertility. What side effects may I notice from receiving this medication? Side effects that you should report to your care team as soon as possible: Allergic reactions--skin rash, itching, hives, swelling of the face, lips, tongue, or throat Bleeding--bloody or black, tar-like stools, vomiting blood or brown material that looks like coffee grounds, red or dark brown urine, small red or purple spots on skin, unusual bruising or bleeding Dry cough, shortness of breath or trouble breathing Heart rhythm changes--fast or irregular heartbeat, dizziness, feeling faint or lightheaded, chest pain, trouble breathing Infection--fever, chills, cough, sore throat, wounds that don't heal, pain or trouble when passing urine, general feeling of discomfort or being unwell Liver injury--right upper belly pain, loss of appetite, nausea, light-colored stool, dark yellow or brown urine, yellowing skin or eyes, unusual  weakness or fatigue Low red blood cell level--unusual weakness or fatigue, dizziness, headache, trouble breathing Muscle injury--unusual weakness or fatigue, muscle pain, dark yellow or brown urine, decrease in amount of urine Pain, tingling, or numbness in the hands or feet Sudden and severe headache, confusion, change in vision, seizures, which may be signs of posterior reversible encephalopathy syndrome (PRES) Unusual bruising or bleeding Side effects that usually do not require medical attention (report to your care team if they continue or are bothersome): Diarrhea Nausea Pain, redness, or swelling with sores inside the mouth or throat Unusual weakness or fatigue Vomiting This list may not describe all possible side effects. Call your doctor for medical advice about side effects. You may report side effects to FDA at 1-800-FDA-1088. Where should I keep my medication? This medication is given in a hospital or clinic. It will not be stored at home. NOTE: This sheet is a summary. It may not cover all possible information. If you have questions about this medicine, talk to your doctor, pharmacist, or health care provider.  2024 Elsevier/Gold Standard (  2021-10-15 00:00:00)  Leucovorin Injection What is this medication? LEUCOVORIN (loo koe VOR in) prevents side effects from certain medications, such as methotrexate. It works by increasing folate levels. This helps protect healthy cells in your body. It may also be used to treat anemia caused by low levels of folate. It can also be used with fluorouracil, a type of chemotherapy, to treat colorectal cancer. It works by increasing the effects of fluorouracil in the body. This medicine may be used for other purposes; ask your health care provider or pharmacist if you have questions. What should I tell my care team before I take this medication? They need to know if you have any of these conditions: Anemia from low levels of vitamin B12 in the  blood An unusual or allergic reaction to leucovorin, folic acid, other medications, foods, dyes, or preservatives Pregnant or trying to get pregnant Breastfeeding How should I use this medication? This medication is injected into a vein or a muscle. It is given by your care team in a hospital or clinic setting. Talk to your care team about the use of this medication in children. Special care may be needed. Overdosage: If you think you have taken too much of this medicine contact a poison control center or emergency room at once. NOTE: This medicine is only for you. Do not share this medicine with others. What if I miss a dose? Keep appointments for follow-up doses. It is important not to miss your dose. Call your care team if you are unable to keep an appointment. What may interact with this medication? Capecitabine Fluorouracil Phenobarbital Phenytoin Primidone Trimethoprim;sulfamethoxazole This list may not describe all possible interactions. Give your health care provider a list of all the medicines, herbs, non-prescription drugs, or dietary supplements you use. Also tell them if you smoke, drink alcohol, or use illegal drugs. Some items may interact with your medicine. What should I watch for while using this medication? Your condition will be monitored carefully while you are receiving this medication. This medication may increase the side effects of 5-fluorouracil. Tell your care team if you have diarrhea or mouth sores that do not get better or that get worse. What side effects may I notice from receiving this medication? Side effects that you should report to your care team as soon as possible: Allergic reactions--skin rash, itching, hives, swelling of the face, lips, tongue, or throat This list may not describe all possible side effects. Call your doctor for medical advice about side effects. You may report side effects to FDA at 1-800-FDA-1088. Where should I keep my  medication? This medication is given in a hospital or clinic. It will not be stored at home. NOTE: This sheet is a summary. It may not cover all possible information. If you have questions about this medicine, talk to your doctor, pharmacist, or health care provider.  2024 Elsevier/Gold Standard (2021-06-04 00:00:00)  Fluorouracil Injection What is this medication? FLUOROURACIL (flure oh YOOR a sil) treats some types of cancer. It works by slowing down the growth of cancer cells. This medicine may be used for other purposes; ask your health care provider or pharmacist if you have questions. COMMON BRAND NAME(S): Adrucil What should I tell my care team before I take this medication? They need to know if you have any of these conditions: Blood disorders Dihydropyrimidine dehydrogenase (DPD) deficiency Infection, such as chickenpox, cold sores, herpes Kidney disease Liver disease Poor nutrition Recent or ongoing radiation therapy An unusual or allergic reaction to  fluorouracil, other medications, foods, dyes, or preservatives If you or your partner are pregnant or trying to get pregnant Breast-feeding How should I use this medication? This medication is injected into a vein. It is administered by your care team in a hospital or clinic setting. Talk to your care team about the use of this medication in children. Special care may be needed. Overdosage: If you think you have taken too much of this medicine contact a poison control center or emergency room at once. NOTE: This medicine is only for you. Do not share this medicine with others. What if I miss a dose? Keep appointments for follow-up doses. It is important not to miss your dose. Call your care team if you are unable to keep an appointment. What may interact with this medication? Do not take this medication with any of the following: Live virus vaccines This medication may also interact with the following: Medications that treat  or prevent blood clots, such as warfarin, enoxaparin, dalteparin This list may not describe all possible interactions. Give your health care provider a list of all the medicines, herbs, non-prescription drugs, or dietary supplements you use. Also tell them if you smoke, drink alcohol, or use illegal drugs. Some items may interact with your medicine. What should I watch for while using this medication? Your condition will be monitored carefully while you are receiving this medication. This medication may make you feel generally unwell. This is not uncommon as chemotherapy can affect healthy cells as well as cancer cells. Report any side effects. Continue your course of treatment even though you feel ill unless your care team tells you to stop. In some cases, you may be given additional medications to help with side effects. Follow all directions for their use. This medication may increase your risk of getting an infection. Call your care team for advice if you get a fever, chills, sore throat, or other symptoms of a cold or flu. Do not treat yourself. Try to avoid being around people who are sick. This medication may increase your risk to bruise or bleed. Call your care team if you notice any unusual bleeding. Be careful brushing or flossing your teeth or using a toothpick because you may get an infection or bleed more easily. If you have any dental work done, tell your dentist you are receiving this medication. Avoid taking medications that contain aspirin, acetaminophen, ibuprofen, naproxen, or ketoprofen unless instructed by your care team. These medications may hide a fever. Do not treat diarrhea with over the counter products. Contact your care team if you have diarrhea that lasts more than 2 days or if it is severe and watery. This medication can make you more sensitive to the sun. Keep out of the sun. If you cannot avoid being in the sun, wear protective clothing and sunscreen. Do not use sun lamps,  tanning beds, or tanning booths. Talk to your care team if you or your partner wish to become pregnant or think you might be pregnant. This medication can cause serious birth defects if taken during pregnancy and for 3 months after the last dose. A reliable form of contraception is recommended while taking this medication and for 3 months after the last dose. Talk to your care team about effective forms of contraception. Do not father a child while taking this medication and for 3 months after the last dose. Use a condom while having sex during this time period. Do not breastfeed while taking this medication. This medication  may cause infertility. Talk to your care team if you are concerned about your fertility. What side effects may I notice from receiving this medication? Side effects that you should report to your care team as soon as possible: Allergic reactions--skin rash, itching, hives, swelling of the face, lips, tongue, or throat Heart attack--pain or tightness in the chest, shoulders, arms, or jaw, nausea, shortness of breath, cold or clammy skin, feeling faint or lightheaded Heart failure--shortness of breath, swelling of the ankles, feet, or hands, sudden weight gain, unusual weakness or fatigue Heart rhythm changes--fast or irregular heartbeat, dizziness, feeling faint or lightheaded, chest pain, trouble breathing High ammonia level--unusual weakness or fatigue, confusion, loss of appetite, nausea, vomiting, seizures Infection--fever, chills, cough, sore throat, wounds that don't heal, pain or trouble when passing urine, general feeling of discomfort or being unwell Low red blood cell level--unusual weakness or fatigue, dizziness, headache, trouble breathing Pain, tingling, or numbness in the hands or feet, muscle weakness, change in vision, confusion or trouble speaking, loss of balance or coordination, trouble walking, seizures Redness, swelling, and blistering of the skin over hands and  feet Severe or prolonged diarrhea Unusual bruising or bleeding Side effects that usually do not require medical attention (report to your care team if they continue or are bothersome): Dry skin Headache Increased tears Nausea Pain, redness, or swelling with sores inside the mouth or throat Sensitivity to light Vomiting This list may not describe all possible side effects. Call your doctor for medical advice about side effects. You may report side effects to FDA at 1-800-FDA-1088. Where should I keep my medication? This medication is given in a hospital or clinic. It will not be stored at home. NOTE: This sheet is a summary. It may not cover all possible information. If you have questions about this medicine, talk to your doctor, pharmacist, or health care provider.  2024 Elsevier/Gold Standard (2021-05-07 00:00:00)

## 2022-09-09 NOTE — Progress Notes (Signed)
[facility] CSW Progress Note  Visual merchandiser met with patient to assess needs and to provide support.  April [name] reported that her food stamps have decreased $60.  Provided her with food from the food pantry and the list of food banks in the area.  Also discussed GI grants and provided her the list.  She is familiar with the grants and plans on applying for them.  Patient expressed no other issues.    April Baseman, LCSW Clinical Social Worker [facility]

## 2022-09-10 ENCOUNTER — Ambulatory Visit: Payer: 59 | Admitting: Gastroenterology

## 2022-09-10 ENCOUNTER — Inpatient Hospital Stay: Payer: 59

## 2022-09-11 ENCOUNTER — Inpatient Hospital Stay: Payer: 59

## 2022-09-11 VITALS — BP 158/98 | HR 93 | Temp 98.2°F | Resp 18

## 2022-09-11 DIAGNOSIS — Z5111 Encounter for antineoplastic chemotherapy: Secondary | ICD-10-CM | POA: Diagnosis not present

## 2022-09-11 DIAGNOSIS — Z452 Encounter for adjustment and management of vascular access device: Secondary | ICD-10-CM

## 2022-09-11 DIAGNOSIS — C19 Malignant neoplasm of rectosigmoid junction: Secondary | ICD-10-CM

## 2022-09-11 MED ORDER — PEGFILGRASTIM-CBQV 6 MG/0.6ML ~~LOC~~ SOSY
6.0000 mg | PREFILLED_SYRINGE | Freq: Once | SUBCUTANEOUS | Status: AC
  Administered 2022-09-11: 6 mg via SUBCUTANEOUS
  Filled 2022-09-11: qty 0.6

## 2022-09-11 MED ORDER — HEPARIN SOD (PORK) LOCK FLUSH 100 UNIT/ML IV SOLN
250.0000 [IU] | INTRAVENOUS | Status: DC | PRN
  Administered 2022-09-11: 250 [IU] via INTRAVENOUS

## 2022-09-11 MED ORDER — SODIUM CHLORIDE 0.9 % IV SOLN: Freq: Once | INTRAVENOUS | Status: AC

## 2022-09-11 MED ORDER — SODIUM CHLORIDE 0.9% FLUSH
10.0000 mL | INTRAVENOUS | Status: DC | PRN
  Administered 2022-09-11: 10 mL

## 2022-09-11 MED ORDER — HEPARIN SOD (PORK) LOCK FLUSH 100 UNIT/ML IV SOLN
500.0000 [IU] | Freq: Once | INTRAVENOUS | Status: AC | PRN
  Administered 2022-09-11: 250 [IU]

## 2022-09-11 NOTE — Patient Instructions (Signed)

## 2022-09-18 ENCOUNTER — Inpatient Hospital Stay: Payer: 59 | Attending: Oncology

## 2022-09-18 VITALS — BP 162/98 | HR 93 | Temp 98.2°F | Resp 18

## 2022-09-18 DIAGNOSIS — Z452 Encounter for adjustment and management of vascular access device: Secondary | ICD-10-CM | POA: Diagnosis not present

## 2022-09-18 DIAGNOSIS — C7989 Secondary malignant neoplasm of other specified sites: Secondary | ICD-10-CM | POA: Diagnosis not present

## 2022-09-18 DIAGNOSIS — C786 Secondary malignant neoplasm of retroperitoneum and peritoneum: Secondary | ICD-10-CM | POA: Diagnosis present

## 2022-09-18 DIAGNOSIS — C7962 Secondary malignant neoplasm of left ovary: Secondary | ICD-10-CM | POA: Insufficient documentation

## 2022-09-18 DIAGNOSIS — Z5111 Encounter for antineoplastic chemotherapy: Secondary | ICD-10-CM | POA: Insufficient documentation

## 2022-09-18 DIAGNOSIS — C7951 Secondary malignant neoplasm of bone: Secondary | ICD-10-CM | POA: Diagnosis not present

## 2022-09-18 DIAGNOSIS — C187 Malignant neoplasm of sigmoid colon: Secondary | ICD-10-CM | POA: Insufficient documentation

## 2022-09-18 DIAGNOSIS — Z79899 Other long term (current) drug therapy: Secondary | ICD-10-CM | POA: Diagnosis not present

## 2022-09-18 MED ORDER — HEPARIN SOD (PORK) LOCK FLUSH 100 UNIT/ML IV SOLN
250.0000 [IU] | Freq: Once | INTRAVENOUS | Status: AC
  Administered 2022-09-18: 250 [IU] via INTRAVENOUS

## 2022-09-18 MED ORDER — SODIUM CHLORIDE 0.9% FLUSH
10.0000 mL | Freq: Once | INTRAVENOUS | Status: AC
  Administered 2022-09-18: 10 mL via INTRAVENOUS

## 2022-09-18 MED ORDER — ALTEPLASE 2 MG IJ SOLR
2.0000 mg | Freq: Once | INTRAMUSCULAR | Status: AC
  Administered 2022-09-18: 2 mg
  Filled 2022-09-18: qty 2

## 2022-09-18 NOTE — Patient Instructions (Signed)
PICC Home Care Guide A peripherally inserted central catheter (PICC) is a form of IV access that allows medicines and IV fluids to be quickly put into the blood and spread throughout the body. The PICC is a long, thin, flexible tube (catheter) that is put into a vein in a person's arm or leg. The catheter ends in a large vein just outside the heart called the superior vena cava (SVC). After the PICC is put in, a chest X-ray may be done to make sure that it is in the right place. A PICC may be placed for different reasons, such as: To give medicines and liquid nutrition. To give IV fluids and blood products. To take blood samples often. If there is trouble placing a peripheral intravenous (PIV) catheter. If cared for properly, a PICC can remain in place for many months. Having a PICC can allow you to go home from the hospital sooner and continue treatment at home. Medicines and PICC care can be managed at home by a family member, caregiver, or home health care team. What are the risks? Generally, having a PICC is safe. However, problems may occur, including: A blood clot (thrombus) forming in or at the end of the PICC. A blood clot forming in a vein (deep vein thrombosis) or traveling to the lung (pulmonary embolism). Inflammation of the vein (phlebitis) in which the PICC is placed. Infection at the insertion site or in the blood. Blood infections from central lines, like PICCs, can be serious and often require a hospital stay. PICC malposition, or PICC movement or poor placement. A break or cut in the PICC. Do not use scissors near the PICC. Nerve or tendon irritation or injury during PICC insertion. How to care for your PICC Please follow the specific guidelines provided by your health care provider. Preventing infection You and any caregivers should wash your hands often with soap and water for at least 20 seconds. Wash hands: Before touching the PICC or the infusion device. Before changing a  bandage (dressing). Do not change the dressing unless you have been taught to do so and have shown you are able to change it safely. Flush the PICC as told. Tell your health care provider right away if the PICC is hard to flush or does not flush. Do not use force to flush the PICC. Use clean and germ-free (sterile) supplies only. Keep the supplies in a dry place. Do not reuse needles, syringes, or any other supplies. Reusing supplies can lead to infection. Keep the PICC dressing dry and secure it with tape if the edges stop sticking to your skin. Check your PICC insertion site every day for signs of infection. Check for: Redness, swelling, or pain. Fluid or blood. Warmth. Pus or a bad smell. Preventing other problems Do not use a syringe that is less than 10 mL to flush the PICC. Do not have your blood pressure checked on the arm in which the PICC is placed. Do not ever pull or tug on the PICC. Keep it secured to your arm with tape or a stretch wrap when not in use. Do not take the PICC out yourself. Only a trained health care provider should remove the PICC. Keep pets and children away from your PICC. How to care for your PICC dressing Keep your PICC dressing clean and dry to prevent infection. Do not take baths, swim, or use a hot tub until your health care provider approves. Ask your health care provider if you can take  showers. You may only be allowed to take sponge baths. When you are allowed to shower: Ask your health care provider to teach you how to wrap the PICC. Cover the PICC with clear plastic wrap and tape to keep it dry while showering. Follow instructions from your health care provider about how to take care of your insertion site and dressing. Make sure you: Wash your hands with soap and water for at least 20 seconds before and after you change your dressing. If soap and water are not available, use hand sanitizer. Change your dressing only if taught to do so by your health care  provider. Your PICC dressing needs to be changed if it becomes loose or wet. Leave stitches (sutures), skin glue, or adhesive strips in place. These skin closures may need to stay in place for 2 weeks or longer. If adhesive strip edges start to loosen and curl up, you may trim the loose edges. Do not remove adhesive strips completely unless your health care provider tells you to do that. Follow these instructions at home: Disposal of supplies Throw away any syringes in a disposal container that is meant for sharp items (sharps container). You can buy a sharps container from a pharmacy, or you can make one by using an empty, hard plastic bottle with a lid. Place any used dressings or infusion bags into a plastic bag. Throw that bag in the trash. General instructions  Always carry your PICC identification card or wear a medical alert bracelet. Keep the tube clamped at all times, unless it is being used. Always carry a smooth-edge clamp with you to clamp the PICC if it breaks. Do not use scissors or sharp objects near the tube. You may bend your arm and move it freely. If your PICC is near or at the bend of your elbow, avoid activity with repeated motion at the elbow. Avoid lifting heavy objects as told by your health care provider. Keep all follow-up visits. This is important. You will need to have your PICC dressing changed at least once a week. Contact a health care provider if: You have pain in your arm, ear, face, or teeth. You have a fever or chills. You have redness, swelling, or pain around the insertion site. You have fluid or blood coming from the insertion site. Your insertion site feels warm to the touch. You have pus or a bad smell coming from the insertion site. Your skin feels hard and raised around the insertion site. Your PICC dressing has gotten wet or is coming off and you have not been taught how to change it. Get help right away if: You have problems with your PICC, such as  your PICC: Was tugged or pulled and has partially come out. Do not  push the PICC back in. Cannot be flushed, is hard to flush, or leaks around the insertion site when it is flushed. Makes a flushing sound when it is flushed. Appears to have a hole or tear. Is accidentally pulled all the way out. If this happens, cover the insertion site with a gauze dressing. Do not throw the PICC away. Your health care provider will need to check it to be sure the entire catheter came out. You feel your heart racing or skipping beats, or you have chest pain. You have shortness of breath or trouble breathing. You have swelling, redness, warmth, or pain in the arm in which the PICC is placed. You have a red streak going up your arm that  starts under the PICC dressing. These symptoms may be an emergency. Get help right away. Call 911. Do not wait to see if the symptoms will go away. Do not drive yourself to the hospital. Summary A peripherally inserted central catheter (PICC) is a long, thin, flexible tube (catheter) that is put into a vein in the arm or leg. If cared for properly, a PICC can remain in place for many months. Having a PICC can allow you to go home from the hospital sooner and continue treatment at home. The PICC is inserted using a germ-free (sterile) technique by a specially trained health care provider. Only a trained health care provider should remove it. Do not have your blood pressure checked on the arm in which your PICC is placed. Always keep your PICC identification card with you. This information is not intended to replace advice given to you by your health care provider. Make sure you discuss any questions you have with your health care provider. Document Revised: 07/18/2020 Document Reviewed: 07/18/2020 Elsevier Patient Education  2024 [employer].

## 2022-09-21 ENCOUNTER — Other Ambulatory Visit: Payer: Self-pay | Admitting: Oncology

## 2022-09-21 DIAGNOSIS — C19 Malignant neoplasm of rectosigmoid junction: Secondary | ICD-10-CM

## 2022-09-22 ENCOUNTER — Inpatient Hospital Stay: Payer: 59

## 2022-09-22 ENCOUNTER — Encounter: Payer: Self-pay | Admitting: Nurse Practitioner

## 2022-09-22 ENCOUNTER — Inpatient Hospital Stay (HOSPITAL_BASED_OUTPATIENT_CLINIC_OR_DEPARTMENT_OTHER): Payer: 59 | Admitting: Nurse Practitioner

## 2022-09-22 ENCOUNTER — Ambulatory Visit: Payer: 59

## 2022-09-22 VITALS — BP 142/92 | HR 80 | Temp 98.1°F | Resp 18 | Ht 64.0 in | Wt 152.0 lb

## 2022-09-22 DIAGNOSIS — C19 Malignant neoplasm of rectosigmoid junction: Secondary | ICD-10-CM

## 2022-09-22 DIAGNOSIS — Z452 Encounter for adjustment and management of vascular access device: Secondary | ICD-10-CM

## 2022-09-22 DIAGNOSIS — Z5111 Encounter for antineoplastic chemotherapy: Secondary | ICD-10-CM | POA: Diagnosis not present

## 2022-09-22 LAB — CBC WITH DIFFERENTIAL (CANCER CENTER ONLY)
Abs Immature Granulocytes: 0.07 10*3/uL (ref 0.00–0.07)
Basophils Absolute: 0.1 10*3/uL (ref 0.0–0.1)
Basophils Relative: 1 %
Eosinophils Absolute: 0.8 10*3/uL — ABNORMAL HIGH (ref 0.0–0.5)
Eosinophils Relative: 9 %
HCT: 39.8 % (ref 36.0–46.0)
Hemoglobin: 13 g/dL (ref 12.0–15.0)
Immature Granulocytes: 1 %
Lymphocytes Relative: 28 %
Lymphs Abs: 2.5 10*3/uL (ref 0.7–4.0)
MCH: 31.6 pg (ref 26.0–34.0)
MCHC: 32.7 g/dL (ref 30.0–36.0)
MCV: 96.6 fL (ref 80.0–100.0)
Monocytes Absolute: 0.7 10*3/uL (ref 0.1–1.0)
Monocytes Relative: 8 %
Neutro Abs: 4.7 10*3/uL (ref 1.7–7.7)
Neutrophils Relative %: 53 %
Platelet Count: 241 10*3/uL (ref 150–400)
RBC: 4.12 MIL/uL (ref 3.87–5.11)
RDW: 15.3 % (ref 11.5–15.5)
WBC Count: 8.9 10*3/uL (ref 4.0–10.5)
nRBC: 0 % (ref 0.0–0.2)

## 2022-09-22 LAB — CMP (CANCER CENTER ONLY)
ALT: 22 U/L (ref 0–44)
AST: 21 U/L (ref 15–41)
Albumin: 4 g/dL (ref 3.5–5.0)
Alkaline Phosphatase: 195 U/L — ABNORMAL HIGH (ref 38–126)
Anion gap: 8 (ref 5–15)
BUN: 11 mg/dL (ref 6–20)
CO2: 29 mmol/L (ref 22–32)
Calcium: 9 mg/dL (ref 8.9–10.3)
Chloride: 102 mmol/L (ref 98–111)
Creatinine: 0.55 mg/dL (ref 0.44–1.00)
GFR, Estimated: >60 mL/min (ref >60)
Glucose, Bld: 91 mg/dL (ref 70–99)
Potassium: 3.6 mmol/L (ref 3.5–5.1)
Sodium: 139 mmol/L (ref 135–145)
Total Bilirubin: 0.2 mg/dL — ABNORMAL LOW (ref 0.3–1.2)
Total Protein: 7.3 g/dL (ref 6.5–8.1)

## 2022-09-22 LAB — CEA (ACCESS): CEA (CHCC): 2.51 ng/mL (ref 0.00–5.00)

## 2022-09-22 MED ORDER — ALTEPLASE 2 MG IJ SOLR
2.0000 mg | Freq: Once | INTRAMUSCULAR | Status: AC
  Administered 2022-09-22: 2 mg
  Filled 2022-09-22: qty 2

## 2022-09-22 NOTE — Progress Notes (Signed)
[facility] OFFICE PROGRESS NOTE   Diagnosis: Colon cancer  INTERVAL HISTORY:   Ms. Pruski returns as scheduled.  She completed cycle 10 FOLFOX 09/08/2022.  She denies nausea/vomiting.  No mouth sores.  No diarrhea.  Cold sensitivity lasted 4 to 5 days.  No persistent neuropathy symptoms.  About a week after treatment she noted mild itchiness on the palms.  No erythema.  No rash or hives.  She took Benadryl and symptoms resolved.  She feels the IV fluids on day of pump discontinuation help.  Objective:  Vital signs in last 24 hours:  Blood pressure (!) 142/92, pulse 80, temperature 98.1 F (36.7 C), temperature source Temporal, resp. rate 18, height 5\' 4"  (1.626 m), weight 152 lb (68.9 kg), last menstrual period 01/07/2006, SpO2 100%.    HEENT: White coating over tongue.  No buccal thrush. Resp: Lungs clear bilaterally. Cardio: Regular rate and rhythm. GI: No hepatosplenomegaly.  No mass. Vascular: No leg edema. Neuro: Vibratory sense with minimal decrease over the fingertips bilaterally. Skin: Palms without erythema. Left upper extremity PICC without erythema.  Lab Results:  Lab Results  Component Value Date   WBC 8.9 09/22/2022   HGB 13.0 09/22/2022   HCT 39.8 09/22/2022   MCV 96.6 09/22/2022   PLT 241 09/22/2022   NEUTROABS 4.7 09/22/2022    Imaging:  No results found.  Medications: I have reviewed the patient's current medications.  Assessment/Plan: Sigmoid colon cancer, stage IV (pT4a,pN2b,M1c) Colonoscopy 03/24/2019-3 rectal polyps-hyperplastic polyps, distal colon biopsy-at least intramucosal adenocarcinoma, completely obstructing mass in the distal sigmoid colon, could not be traversed, intact mismatch repair protein expression 03/24/2019-CEA 56.1 03/29/2019 CT abdomen/pelvis-circumferential thickening involving the entire mid and distal sigmoid colon to the level of the rectosigmoid junction, solid/cystic mass in the left ovary, bilateral  nephrolithiasis Robotic assisted low anterior resection, mesenteric lymphadenectomy, bilateral salpingo-oophorectomy 04/08/2019 Pathology ([facility] review of outside pathology) metastatic adenocarcinoma involving the peritoneum overlying the round ligament, serosa of the urinary bladder, serosa of the right ureter a sacral area, pelvic peritoneum, and left ovary.  Omental biopsy with focal mucin pools with no carcinoma cells identified, right hemidiaphragm biopsy involved by metastatic adenocarcinoma, invasive adenocarcinoma the sigmoid colon, moderately differentiated, T4a, perineural and vascular invasion present, 7/22 lymph nodes, multiple tumor deposits, resection margins negative Negative for PD-L1, low probability of MSI-high, HER-2 negative, negative for BRAF, NRAS and KRAS alterations CTs 05/19/2019-no evidence of metastatic disease, findings suspicious for colitis of the transverse and ascending colon, small amount of ascites in the cul-de-sac, bilateral renal calculi Cycle 1 FOLFIRI 05/24/2019, bevacizumab added with cycle 2 Cycle 4 FOLFIRI/bevacizumab 07/05/2019 Cycle 5 FOLFIRI 07/27/2019, bevacizumab held secondary to hypertension Cycle 6 FOLFIRI 08/10/1999, bevacizumab held secondary to hypertension CTs 08/30/2019-no evidence of recurrent disease, new subsolid right lower lobe nodule felt to be inflammatory, emphysema Cycle 7 FOLFIRI 09/26/2019, bevacizumab remains on hold secondary to hypertension Cycle 8 FOLFIRI 10/17/2019, bevacizumab held secondary to hypertension, Udenyca added for neutropenia Cycle 9 FOLFIRI 11/07/2019, bevacizumab held secondary to hypertension, Udenyca Cycle 10 FOLFIRI 11/28/2019, bevacizumab held, Udenyca Cycle 11 FOLFIRI 12/26/2019, bevacizumab held, Udenyca CTs 01/25/2020-no evidence of recurrent disease, resolution of right lower lobe nodule Maintenance Xeloda beginning 02/06/2020 CTs 04/25/2020- no evidence of metastatic disease, multiple bilateral renal calculi without  hydronephrosis Maintenance Xeloda continued CT abdomen/pelvis without contrast 08/10/2020-bilateral staghorn renal calculi, no evidence of metastatic disease; addendum 08/23/2020-small but increasing omental nodules. Cycle 1 FOLFIRI/panitumumab 09/19/2020 Cycle 2 FOLFIRI/Panitumumab 10/03/2020 Cycle 3 FOLFIRI/Panitumumab 10/17/2020 Cycle 4 FOLFIRI/panitumumab 10/31/2020  Cycle 5 FOLFIRI/Panitumumab 11/14/2020 CTs 11/26/2020-stable omental metastases compared to 10/22/2020, mildly decreased from 08/10/2020.  Left lower quadrant tiny paracolic gutter implant slightly decreased from CT 08/10/2020.  No new or progressive metastatic disease in the abdomen or pelvis. Cycle 6 FOLFIRI/Panitumumab 11/28/2020, irinotecan dose reduced, treatment schedule adjusted to every 3 weeks going forward Cycle 7 FOLFIRI/panitumumab 12/24/2020 Cycle 8 FOLFIRI/Panitumumab 01/16/2021--treatment held due to hypertension in the infusion area. Cycle 8 FOLFIRI/panitumumab 01/21/2021 Cycle 9 FOLFIRI/panitumumab 02/11/2021 Cycle 10 FOLFIRI/Panitumumab 03/04/2021 Cycle 11 FOLFIRI/Panitumumab 03/25/2021 CT abdomen/pelvis 04/10/2021-stable right omental and left posterior paracolic gutter nodules Cycle 12 FOLFIRI/panitumumab 04/15/2021 Cycle 13 FOLFIRI/panitumumab 05/06/2021 Cycle 14 FOLFIRI/Panitumumab 05/27/2021 Cycle 15 FOLFIRI/Panitumumab 06/17/2021 CT abdomen/pelvis 07/05/2021-slight enlargement of a dominant right omental implant, no new implants Patient requested a treatment break CT abdomen/pelvis 10/04/2021-mild increase in size of multiple omental soft tissue nodules, 4 mm calculus in the distal left ureter adjacent to the ureteral stent Cycle 1 Lonsurf 10/14/2021 10/28/2021 Avastin every 2 weeks Cycle 2 Lonsurf 11/11/2021 Cycle 3 Lonsurf 12/09/2021 Cycle 4 Lonsurf 01/06/2022 Avastin held 01/20/2022 due to proteinuria, 24-hour urine 43 mg CT/pelvis 01/30/2022-possible new peritoneal implant at the transverse colon, no significant change in  other omental/peritoneal implants, stable chronic rectal wall thickening, status post removal of double-J left ureteral stent with no evidence of hydronephrosis or ureteral calculus, nonobstructing bilateral renal calculi Cycle 5 Lonsurf 02/05/2022 or 02/06/2022 Avastin every 2 weeks Cycle 6 Lonsurf 03/03/2022 CTs 04/05/2022-increase in size and number of peritoneal implants compared to January 2024 central liver mass narrowing the anterior branch of the right portal vein, decreased biliary duct dilation, biliary stents in place, stable right cardiophrenic lymph nodes, separate pigtail stent in the lumen of the proximal duodenum Cycle 1 FOLFOX 04/14/2022 CT abdomen/pelvis 04/16/2022-stent within the third portion of the duodenum, stable peritoneal nodules, indwelling biliary stents with mild left biliary duct dilation Cycle 2 FOLFOX 04/28/2022, Emend and prophylactic dexamethasone added Cycle 3 FOLFOX 05/12/2022, Aloxi, Emend, prophylactic dexamethasone, Compazine and lorazepam as needed Chemotherapy held 05/25/2020 due to severe hypertension, evaluated in the emergency department Cycle 4 FOLFOX 06/03/2022 Cycle 5 FOLFOX 06/16/2022, oxaliplatin dose reduced and 5-FU bolus eliminated due to neutropenia CT 06/25/2022-unchanged soft tissue at the hepatic hilum, unchanged, bile duct stent, decreased size of peritoneal and omental nodules and diminished volume of likely fluid in the lower left pelvis Cycle 6 FOLFOX 06/30/2022 Cycle 7 FOLFOX 07/21/2022 CT 07/24/2022 (in the emergency department with nausea, headache, abdominal pain)-no acute pathology.  No significant interval change in omental nodularity. Cycle 8 FOLFOX 08/04/2022, pruritus on the palms at the end of the oxaliplatin infusion, Pepcid and Benadryl given, symptoms resolved, infusion not resumed Cycle 9 FOLFOX 08/18/2022-Pepcid and Benadryl added to premedications, Oxaliplatin diluted in a larger volume and infusion time increased Cycle 10 FOLFOX  09/08/2022 Cycle 11 FOLFOX 09/22/2022   Hypertension G4 P3, twins Kidney stones-bilateral staghorn renal calculi on CT 08/10/2020 Infected Port-A-Cath 09/12/2019-placed on Augmentin, referred for Port-A-Cath removal; Port-A-Cath removed 09/14/2019; PICC line placed 09/14/2019; culture staph aureus.  Course of Septra completed. Neutropenia secondary to chemotherapy-Udenyca added with cycle 8 FOLFIRI Right nephrostomy tube 09/12/2020; stent placed 10/09/2020, percutaneous nephrostolithotomy treatment of right-sided kidney stones 10/09/2020, malpositioned right ureter stent replacement 10/23/2020, right ureter stent removed 10/29/2020 Percutaneous left nephrostolithotomy 10/01/2021-no renal stone identified, left ureter stent left in place Cystoscopy 10/14/2021-stent removed 8.  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  Admission 04/16/2022 with nausea/vomiting and abdominal pain, felt to be acute toxicity related to chemotherapy, discharged home 04/18/2022    Disposition: [name] appears stable.  She has completed 10 cycles of FOLFOX.  Plan to proceed with cycle 11 today as scheduled.  We reviewed the overall plan for restaging CTs after cycle 12.  CBC and chemistry panel reviewed.  Labs adequate to proceed as above.  She noted pruritus over the palms about a week after treatment, no other symptoms.  She will contact the office if this recurs or she develops other symptoms.  She will return for follow-up and treatment  in 2 weeks.    Lonna Cobb ANP/GNP-BC   09/22/2022  9:13 AM

## 2022-09-22 NOTE — Patient Instructions (Signed)

## 2022-09-22 NOTE — Progress Notes (Signed)
Patient seen by Lonna Cobb NP today  Vitals are not all within treatment parameters. No intervention with blood pressure  Labs reviewed by Lonna Cobb NP and are within treatment parameters.  Per physician team, patient is ready for treatment and there are NO modifications to the treatment plan.  Chemo is scheduled for 09/23/22

## 2022-09-23 ENCOUNTER — Inpatient Hospital Stay: Payer: 59

## 2022-09-23 VITALS — BP 158/92 | HR 91 | Temp 98.2°F | Resp 18

## 2022-09-23 DIAGNOSIS — Z5111 Encounter for antineoplastic chemotherapy: Secondary | ICD-10-CM | POA: Diagnosis not present

## 2022-09-23 DIAGNOSIS — C19 Malignant neoplasm of rectosigmoid junction: Secondary | ICD-10-CM

## 2022-09-23 MED ORDER — PALONOSETRON HCL INJECTION 0.25 MG/5ML
0.2500 mg | Freq: Once | INTRAVENOUS | Status: AC
  Administered 2022-09-23: 0.25 mg via INTRAVENOUS
  Filled 2022-09-23: qty 5

## 2022-09-23 MED ORDER — SODIUM CHLORIDE 0.9 % IV SOLN
3100.0000 mg | INTRAVENOUS | Status: DC
  Administered 2022-09-23: 3100 mg via INTRAVENOUS
  Filled 2022-09-23: qty 62

## 2022-09-23 MED ORDER — DEXTROSE 5 % IV SOLN: Freq: Once | INTRAVENOUS | Status: AC

## 2022-09-23 MED ORDER — LEUCOVORIN CALCIUM INJECTION 350 MG
400.0000 mg/m2 | Freq: Once | INTRAVENOUS | Status: AC
  Administered 2022-09-23: 704 mg via INTRAVENOUS
  Filled 2022-09-23: qty 35.2

## 2022-09-23 MED ORDER — OXALIPLATIN CHEMO INJECTION 100 MG/20ML
65.0000 mg/m2 | Freq: Once | INTRAVENOUS | Status: AC
  Administered 2022-09-23: 115 mg via INTRAVENOUS
  Filled 2022-09-23: qty 20

## 2022-09-23 MED ORDER — SODIUM CHLORIDE 0.9 % IV SOLN
150.0000 mg | Freq: Once | INTRAVENOUS | Status: AC
  Administered 2022-09-23: 150 mg via INTRAVENOUS
  Filled 2022-09-23: qty 150

## 2022-09-23 MED ORDER — FLUOROURACIL CHEMO INJECTION 2.5 GM/50ML
400.0000 mg/m2 | Freq: Once | INTRAVENOUS | Status: AC
  Administered 2022-09-23: 700 mg via INTRAVENOUS
  Filled 2022-09-23: qty 14

## 2022-09-23 MED ORDER — SODIUM CHLORIDE 0.9 % IV SOLN
10.0000 mg | Freq: Once | INTRAVENOUS | Status: AC
  Administered 2022-09-23: 10 mg via INTRAVENOUS
  Filled 2022-09-23: qty 10

## 2022-09-23 MED ORDER — FAMOTIDINE IN NACL 20-0.9 MG/50ML-% IV SOLN
20.0000 mg | Freq: Once | INTRAVENOUS | Status: AC
  Administered 2022-09-23: 20 mg via INTRAVENOUS
  Filled 2022-09-23: qty 50

## 2022-09-23 MED ORDER — DIPHENHYDRAMINE HCL 50 MG/ML IJ SOLN
50.0000 mg | Freq: Once | INTRAMUSCULAR | Status: AC
  Administered 2022-09-23: 50 mg via INTRAVENOUS
  Filled 2022-09-23: qty 1

## 2022-09-23 NOTE — Patient Instructions (Addendum)
[facility] The chemotherapy medication bag should finish at 46 hours, 96 hours, or 7 days. For example, if your pump is scheduled for 46 hours and it was put on at 4:00 p.m., it should finish at 2:00 p.m. the day it is scheduled to come off regardless of your appointment time.     Estimated time to finish at 12pm Thursday September 25, 2022.   If the display on your pump reads "Low Volume" and it is beeping, take the batteries out of the pump and come to the cancer center for it to be taken off.   If the pump alarms go off prior to the pump reading "Low Volume" then call [phone] and someone can assist you.  If the plunger comes out and the chemotherapy medication is leaking out, please use your home chemo spill kit to clean up the spill. Do NOT use paper towels or other household products.  If you have problems or questions regarding your pump, please call either [phone] (24 hours a day) or the cancer center Monday-Friday 8:00 a.m.- 4:30 p.m. at the clinic number and we will assist you. If you are unable to get assistance, then go to the nearest Emergency Department and ask the staff to contact the IV team for assistance.    Discharge Instructions: Thank you for choosing [facility] to provide your oncology and hematology care.   If you have a lab appointment with the Cancer Center, please go directly to the Cancer Center and check in at the registration area.   Wear comfortable clothing and clothing appropriate for easy access to any Portacath or PICC line.   We strive to give you quality time with your provider. You may need to reschedule your appointment if you arrive late (15 or more minutes).  Arriving late affects you and other patients whose appointments are after yours.  Also, if you miss three or more appointments without notifying the office, you may be dismissed from the clinic at the provider's discretion.      For  prescription refill requests, have your pharmacy contact our office and allow 72 hours for refills to be completed.    Today you received the following chemotherapy and/or immunotherapy agents Oxaliplatin, Leucovorin, Fluorouracil.      To help prevent nausea and vomiting after your treatment, we encourage you to take your nausea medication as directed.  BELOW ARE SYMPTOMS THAT SHOULD BE REPORTED IMMEDIATELY: *FEVER GREATER THAN 100.4 F (38 C) OR HIGHER *CHILLS OR SWEATING *NAUSEA AND VOMITING THAT IS NOT CONTROLLED WITH YOUR NAUSEA MEDICATION *UNUSUAL SHORTNESS OF BREATH *UNUSUAL BRUISING OR BLEEDING *URINARY PROBLEMS (pain or burning when urinating, or frequent urination) *BOWEL PROBLEMS (unusual diarrhea, constipation, pain near the anus) TENDERNESS IN MOUTH AND THROAT WITH OR WITHOUT PRESENCE OF ULCERS (sore throat, sores in mouth, or a toothache) UNUSUAL RASH, SWELLING OR PAIN  UNUSUAL VAGINAL DISCHARGE OR ITCHING   Items with * indicate a potential emergency and should be followed up as soon as possible or go to the Emergency Department if any problems should occur.  Please show the CHEMOTHERAPY ALERT CARD or IMMUNOTHERAPY ALERT CARD at check-in to the Emergency Department and triage nurse.  Should you have questions after your visit or need to cancel or reschedule your appointment, please contact [facility]  Dept: [phone]  and follow the prompts.  Office hours are 8:00 a.m. to 4:30 p.m. Monday - Friday. Please note  that voicemails left after 4:00 p.m. may not be returned until the following business day.  We are closed weekends and major holidays. You have access to a nurse at all times for urgent questions. Please call the main number to the clinic Dept: [phone] and follow the prompts.   For any non-urgent questions, you may also contact your provider using MyChart. We now offer e-Visits for anyone 30 and older to request care online  for non-urgent symptoms. For details visit [email or web address].   Also download the MyChart app! Go to the app store, search "MyChart", open the app, select [place], and log in with your MyChart username and password.  Oxaliplatin Injection What is this medication? OXALIPLATIN (ox AL i PLA tin) treats colorectal cancer. It works by slowing down the growth of cancer cells. This medicine may be used for other purposes; ask your health care provider or pharmacist if you have questions. COMMON BRAND NAME(S): Eloxatin What should I tell my care team before I take this medication? They need to know if you have any of these conditions: Heart disease History of irregular heartbeat or rhythm Liver disease Low blood cell levels (white cells, red cells, and platelets) Lung or breathing disease, such as asthma Take medications that treat or prevent blood clots Tingling of the fingers, toes, or other nerve disorder An unusual or allergic reaction to oxaliplatin, other medications, foods, dyes, or preservatives If you or your partner are pregnant or trying to get pregnant Breast-feeding How should I use this medication? This medication is injected into a vein. It is given by your care team in a hospital or clinic setting. Talk to your care team about the use of this medication in children. Special care may be needed. Overdosage: If you think you have taken too much of this medicine contact a poison control center or emergency room at once. NOTE: This medicine is only for you. Do not share this medicine with others. What if I miss a dose? Keep appointments for follow-up doses. It is important not to miss a dose. Call your care team if you are unable to keep an appointment. What may interact with this medication? Do not take this medication with any of the following: Cisapride Dronedarone Pimozide Thioridazine This medication may also interact with the following: Aspirin and aspirin-like  medications Certain medications that treat or prevent blood clots, such as warfarin, apixaban, dabigatran, and rivaroxaban Cisplatin Cyclosporine Diuretics Medications for infection, such as acyclovir, adefovir, amphotericin B, bacitracin, cidofovir, foscarnet, ganciclovir, gentamicin, pentamidine, vancomycin NSAIDs, medications for pain and inflammation, such as ibuprofen or naproxen Other medications that cause heart rhythm changes Pamidronate Zoledronic acid This list may not describe all possible interactions. Give your health care provider a list of all the medicines, herbs, non-prescription drugs, or dietary supplements you use. Also tell them if you smoke, drink alcohol, or use illegal drugs. Some items may interact with your medicine. What should I watch for while using this medication? Your condition will be monitored carefully while you are receiving this medication. You may need blood work while taking this medication. This medication may make you feel generally unwell. This is not uncommon as chemotherapy can affect healthy cells as well as cancer cells. Report any side effects. Continue your course of treatment even though you feel ill unless your care team tells you to stop. This medication may increase your risk of getting an infection. Call your care team for advice if you get a fever, chills, sore throat, or  other symptoms of a cold or flu. Do not treat yourself. Try to avoid being around people who are sick. Avoid taking medications that contain aspirin, acetaminophen, ibuprofen, naproxen, or ketoprofen unless instructed by your care team. These medications may hide a fever. Be careful brushing or flossing your teeth or using a toothpick because you may get an infection or bleed more easily. If you have any dental work done, tell your dentist you are receiving this medication. This medication can make you more sensitive to cold. Do not drink cold drinks or use ice. Cover exposed  skin before coming in contact with cold temperatures or cold objects. When out in cold weather wear warm clothing and cover your mouth and nose to warm the air that goes into your lungs. Tell your care team if you get sensitive to the cold. Talk to your care team if you or your partner are pregnant or think either of you might be pregnant. This medication can cause serious birth defects if taken during pregnancy and for 9 months after the last dose. A negative pregnancy test is required before starting this medication. A reliable form of contraception is recommended while taking this medication and for 9 months after the last dose. Talk to your care team about effective forms of contraception. Do not father a child while taking this medication and for 6 months after the last dose. Use a condom while having sex during this time period. Do not breastfeed while taking this medication and for 3 months after the last dose. This medication may cause infertility. Talk to your care team if you are concerned about your fertility. What side effects may I notice from receiving this medication? Side effects that you should report to your care team as soon as possible: Allergic reactions--skin rash, itching, hives, swelling of the face, lips, tongue, or throat Bleeding--bloody or black, tar-like stools, vomiting blood or brown material that looks like coffee grounds, red or dark brown urine, small red or purple spots on skin, unusual bruising or bleeding Dry cough, shortness of breath or trouble breathing Heart rhythm changes--fast or irregular heartbeat, dizziness, feeling faint or lightheaded, chest pain, trouble breathing Infection--fever, chills, cough, sore throat, wounds that don't heal, pain or trouble when passing urine, general feeling of discomfort or being unwell Liver injury--right upper belly pain, loss of appetite, nausea, light-colored stool, dark yellow or brown urine, yellowing skin or eyes, unusual  weakness or fatigue Low red blood cell level--unusual weakness or fatigue, dizziness, headache, trouble breathing Muscle injury--unusual weakness or fatigue, muscle pain, dark yellow or brown urine, decrease in amount of urine Pain, tingling, or numbness in the hands or feet Sudden and severe headache, confusion, change in vision, seizures, which may be signs of posterior reversible encephalopathy syndrome (PRES) Unusual bruising or bleeding Side effects that usually do not require medical attention (report to your care team if they continue or are bothersome): Diarrhea Nausea Pain, redness, or swelling with sores inside the mouth or throat Unusual weakness or fatigue Vomiting This list may not describe all possible side effects. Call your doctor for medical advice about side effects. You may report side effects to FDA at 1-800-FDA-1088. Where should I keep my medication? This medication is given in a hospital or clinic. It will not be stored at home. NOTE: This sheet is a summary. It may not cover all possible information. If you have questions about this medicine, talk to your doctor, pharmacist, or health care provider.  2024 Elsevier/Gold Standard (  2021-10-15 00:00:00)  Leucovorin Injection What is this medication? LEUCOVORIN (loo koe VOR in) prevents side effects from certain medications, such as methotrexate. It works by increasing folate levels. This helps protect healthy cells in your body. It may also be used to treat anemia caused by low levels of folate. It can also be used with fluorouracil, a type of chemotherapy, to treat colorectal cancer. It works by increasing the effects of fluorouracil in the body. This medicine may be used for other purposes; ask your health care provider or pharmacist if you have questions. What should I tell my care team before I take this medication? They need to know if you have any of these conditions: Anemia from low levels of vitamin B12 in the  blood An unusual or allergic reaction to leucovorin, folic acid, other medications, foods, dyes, or preservatives Pregnant or trying to get pregnant Breastfeeding How should I use this medication? This medication is injected into a vein or a muscle. It is given by your care team in a hospital or clinic setting. Talk to your care team about the use of this medication in children. Special care may be needed. Overdosage: If you think you have taken too much of this medicine contact a poison control center or emergency room at once. NOTE: This medicine is only for you. Do not share this medicine with others. What if I miss a dose? Keep appointments for follow-up doses. It is important not to miss your dose. Call your care team if you are unable to keep an appointment. What may interact with this medication? Capecitabine Fluorouracil Phenobarbital Phenytoin Primidone Trimethoprim;sulfamethoxazole This list may not describe all possible interactions. Give your health care provider a list of all the medicines, herbs, non-prescription drugs, or dietary supplements you use. Also tell them if you smoke, drink alcohol, or use illegal drugs. Some items may interact with your medicine. What should I watch for while using this medication? Your condition will be monitored carefully while you are receiving this medication. This medication may increase the side effects of 5-fluorouracil. Tell your care team if you have diarrhea or mouth sores that do not get better or that get worse. What side effects may I notice from receiving this medication? Side effects that you should report to your care team as soon as possible: Allergic reactions--skin rash, itching, hives, swelling of the face, lips, tongue, or throat This list may not describe all possible side effects. Call your doctor for medical advice about side effects. You may report side effects to FDA at 1-800-FDA-1088. Where should I keep my  medication? This medication is given in a hospital or clinic. It will not be stored at home. NOTE: This sheet is a summary. It may not cover all possible information. If you have questions about this medicine, talk to your doctor, pharmacist, or health care provider.  2024 Elsevier/Gold Standard (2021-06-04 00:00:00)  Fluorouracil Injection What is this medication? FLUOROURACIL (flure oh YOOR a sil) treats some types of cancer. It works by slowing down the growth of cancer cells. This medicine may be used for other purposes; ask your health care provider or pharmacist if you have questions. COMMON BRAND NAME(S): Adrucil What should I tell my care team before I take this medication? They need to know if you have any of these conditions: Blood disorders Dihydropyrimidine dehydrogenase (DPD) deficiency Infection, such as chickenpox, cold sores, herpes Kidney disease Liver disease Poor nutrition Recent or ongoing radiation therapy An unusual or allergic reaction to  fluorouracil, other medications, foods, dyes, or preservatives If you or your partner are pregnant or trying to get pregnant Breast-feeding How should I use this medication? This medication is injected into a vein. It is administered by your care team in a hospital or clinic setting. Talk to your care team about the use of this medication in children. Special care may be needed. Overdosage: If you think you have taken too much of this medicine contact a poison control center or emergency room at once. NOTE: This medicine is only for you. Do not share this medicine with others. What if I miss a dose? Keep appointments for follow-up doses. It is important not to miss your dose. Call your care team if you are unable to keep an appointment. What may interact with this medication? Do not take this medication with any of the following: Live virus vaccines This medication may also interact with the following: Medications that treat  or prevent blood clots, such as warfarin, enoxaparin, dalteparin This list may not describe all possible interactions. Give your health care provider a list of all the medicines, herbs, non-prescription drugs, or dietary supplements you use. Also tell them if you smoke, drink alcohol, or use illegal drugs. Some items may interact with your medicine. What should I watch for while using this medication? Your condition will be monitored carefully while you are receiving this medication. This medication may make you feel generally unwell. This is not uncommon as chemotherapy can affect healthy cells as well as cancer cells. Report any side effects. Continue your course of treatment even though you feel ill unless your care team tells you to stop. In some cases, you may be given additional medications to help with side effects. Follow all directions for their use. This medication may increase your risk of getting an infection. Call your care team for advice if you get a fever, chills, sore throat, or other symptoms of a cold or flu. Do not treat yourself. Try to avoid being around people who are sick. This medication may increase your risk to bruise or bleed. Call your care team if you notice any unusual bleeding. Be careful brushing or flossing your teeth or using a toothpick because you may get an infection or bleed more easily. If you have any dental work done, tell your dentist you are receiving this medication. Avoid taking medications that contain aspirin, acetaminophen, ibuprofen, naproxen, or ketoprofen unless instructed by your care team. These medications may hide a fever. Do not treat diarrhea with over the counter products. Contact your care team if you have diarrhea that lasts more than 2 days or if it is severe and watery. This medication can make you more sensitive to the sun. Keep out of the sun. If you cannot avoid being in the sun, wear protective clothing and sunscreen. Do not use sun lamps,  tanning beds, or tanning booths. Talk to your care team if you or your partner wish to become pregnant or think you might be pregnant. This medication can cause serious birth defects if taken during pregnancy and for 3 months after the last dose. A reliable form of contraception is recommended while taking this medication and for 3 months after the last dose. Talk to your care team about effective forms of contraception. Do not father a child while taking this medication and for 3 months after the last dose. Use a condom while having sex during this time period. Do not breastfeed while taking this medication. This medication  may cause infertility. Talk to your care team if you are concerned about your fertility. What side effects may I notice from receiving this medication? Side effects that you should report to your care team as soon as possible: Allergic reactions--skin rash, itching, hives, swelling of the face, lips, tongue, or throat Heart attack--pain or tightness in the chest, shoulders, arms, or jaw, nausea, shortness of breath, cold or clammy skin, feeling faint or lightheaded Heart failure--shortness of breath, swelling of the ankles, feet, or hands, sudden weight gain, unusual weakness or fatigue Heart rhythm changes--fast or irregular heartbeat, dizziness, feeling faint or lightheaded, chest pain, trouble breathing High ammonia level--unusual weakness or fatigue, confusion, loss of appetite, nausea, vomiting, seizures Infection--fever, chills, cough, sore throat, wounds that don't heal, pain or trouble when passing urine, general feeling of discomfort or being unwell Low red blood cell level--unusual weakness or fatigue, dizziness, headache, trouble breathing Pain, tingling, or numbness in the hands or feet, muscle weakness, change in vision, confusion or trouble speaking, loss of balance or coordination, trouble walking, seizures Redness, swelling, and blistering of the skin over hands and  feet Severe or prolonged diarrhea Unusual bruising or bleeding Side effects that usually do not require medical attention (report to your care team if they continue or are bothersome): Dry skin Headache Increased tears Nausea Pain, redness, or swelling with sores inside the mouth or throat Sensitivity to light Vomiting This list may not describe all possible side effects. Call your doctor for medical advice about side effects. You may report side effects to FDA at 1-800-FDA-1088. Where should I keep my medication? This medication is given in a hospital or clinic. It will not be stored at home. NOTE: This sheet is a summary. It may not cover all possible information. If you have questions about this medicine, talk to your doctor, pharmacist, or health care provider.  2024 Elsevier/Gold Standard (2021-05-07 00:00:00)

## 2022-09-25 ENCOUNTER — Inpatient Hospital Stay: Payer: 59

## 2022-09-25 ENCOUNTER — Other Ambulatory Visit: Payer: Self-pay

## 2022-09-25 VITALS — BP 164/98 | HR 85 | Temp 98.4°F | Resp 18

## 2022-09-25 DIAGNOSIS — Z452 Encounter for adjustment and management of vascular access device: Secondary | ICD-10-CM

## 2022-09-25 DIAGNOSIS — C19 Malignant neoplasm of rectosigmoid junction: Secondary | ICD-10-CM

## 2022-09-25 DIAGNOSIS — Z5111 Encounter for antineoplastic chemotherapy: Secondary | ICD-10-CM | POA: Diagnosis not present

## 2022-09-25 DIAGNOSIS — R112 Nausea with vomiting, unspecified: Secondary | ICD-10-CM

## 2022-09-25 MED ORDER — PROCHLORPERAZINE EDISYLATE 10 MG/2ML IJ SOLN
10.0000 mg | Freq: Once | INTRAMUSCULAR | Status: AC
  Administered 2022-09-25: 10 mg via INTRAVENOUS
  Filled 2022-09-25: qty 2

## 2022-09-25 MED ORDER — HEPARIN SOD (PORK) LOCK FLUSH 100 UNIT/ML IV SOLN
250.0000 [IU] | Freq: Once | INTRAVENOUS | Status: AC
  Administered 2022-09-25: 250 [IU] via INTRAVENOUS

## 2022-09-25 MED ORDER — HEPARIN SOD (PORK) LOCK FLUSH 100 UNIT/ML IV SOLN
250.0000 [IU] | Freq: Once | INTRAVENOUS | Status: AC | PRN
  Administered 2022-09-25: 250 [IU]

## 2022-09-25 MED ORDER — HEPARIN SOD (PORK) LOCK FLUSH 100 UNIT/ML IV SOLN: 500.0000 [IU] | Freq: Once | INTRAVENOUS | Status: DC | PRN

## 2022-09-25 MED ORDER — SODIUM CHLORIDE 0.9 % IV SOLN: INTRAVENOUS | Status: DC

## 2022-09-25 MED ORDER — PEGFILGRASTIM-CBQV 6 MG/0.6ML ~~LOC~~ SOSY
6.0000 mg | PREFILLED_SYRINGE | Freq: Once | SUBCUTANEOUS | Status: AC
  Administered 2022-09-25: 6 mg via SUBCUTANEOUS
  Filled 2022-09-25: qty 0.6

## 2022-09-25 MED ORDER — SODIUM CHLORIDE 0.9% FLUSH
10.0000 mL | INTRAVENOUS | Status: DC | PRN
  Administered 2022-09-25: 10 mL

## 2022-09-25 NOTE — Patient Instructions (Signed)

## 2022-09-25 NOTE — Progress Notes (Signed)
Patient in infusion room for IVF and chemo pump d/c.  Patient reported nausea with vomiting this AM, with last dose of compazine PO at 2am.  Patient stated she took her home decadron dose this morning.  Patient requested for IV nausea meds.  Lonna Cobb, NP made aware. Verbal order received for Compazine IV (see MAR).  Patient reported improvement of nausea at the end of her IVF.  Patient was able to drink a can of gingerale and eat some saltine crackers during her time in the infusion room.  Encouraged patient to continue hydration at home and to take her home nausea medications as needed.  Patient verbalized understanding.

## 2022-09-30 ENCOUNTER — Ambulatory Visit: Payer: 59

## 2022-09-30 ENCOUNTER — Other Ambulatory Visit: Payer: Self-pay | Admitting: Oncology

## 2022-10-02 ENCOUNTER — Inpatient Hospital Stay: Payer: 59

## 2022-10-02 VITALS — BP 168/98 | HR 105 | Temp 98.2°F | Resp 18

## 2022-10-02 DIAGNOSIS — Z5111 Encounter for antineoplastic chemotherapy: Secondary | ICD-10-CM | POA: Diagnosis not present

## 2022-10-02 DIAGNOSIS — Z452 Encounter for adjustment and management of vascular access device: Secondary | ICD-10-CM

## 2022-10-02 MED ORDER — HEPARIN SOD (PORK) LOCK FLUSH 100 UNIT/ML IV SOLN
250.0000 [IU] | Freq: Once | INTRAVENOUS | Status: AC
  Administered 2022-10-02: 250 [IU] via INTRAVENOUS

## 2022-10-02 MED ORDER — SODIUM CHLORIDE 0.9% FLUSH
10.0000 mL | Freq: Once | INTRAVENOUS | Status: AC
  Administered 2022-10-02: 10 mL via INTRAVENOUS

## 2022-10-02 NOTE — Patient Instructions (Signed)
PICC Home Care Guide A peripherally inserted central catheter (PICC) is a form of IV access that allows medicines and IV fluids to be quickly put into the blood and spread throughout the body. The PICC is a long, thin, flexible tube (catheter) that is put into a vein in a person's arm or leg. The catheter ends in a large vein just outside the heart called the superior vena cava (SVC). After the PICC is put in, a chest X-ray may be done to make sure that it is in the right place. A PICC may be placed for different reasons, such as: To give medicines and liquid nutrition. To give IV fluids and blood products. To take blood samples often. If there is trouble placing a peripheral intravenous (PIV) catheter. If cared for properly, a PICC can remain in place for many months. Having a PICC can allow you to go home from the hospital sooner and continue treatment at home. Medicines and PICC care can be managed at home by a family member, caregiver, or home health care team. What are the risks? Generally, having a PICC is safe. However, problems may occur, including: A blood clot (thrombus) forming in or at the end of the PICC. A blood clot forming in a vein (deep vein thrombosis) or traveling to the lung (pulmonary embolism). Inflammation of the vein (phlebitis) in which the PICC is placed. Infection at the insertion site or in the blood. Blood infections from central lines, like PICCs, can be serious and often require a hospital stay. PICC malposition, or PICC movement or poor placement. A break or cut in the PICC. Do not use scissors near the PICC. Nerve or tendon irritation or injury during PICC insertion. How to care for your PICC Please follow the specific guidelines provided by your health care provider. Preventing infection You and any caregivers should wash your hands often with soap and water for at least 20 seconds. Wash hands: Before touching the PICC or the infusion device. Before changing a  bandage (dressing). Do not change the dressing unless you have been taught to do so and have shown you are able to change it safely. Flush the PICC as told. Tell your health care provider right away if the PICC is hard to flush or does not flush. Do not use force to flush the PICC. Use clean and germ-free (sterile) supplies only. Keep the supplies in a dry place. Do not reuse needles, syringes, or any other supplies. Reusing supplies can lead to infection. Keep the PICC dressing dry and secure it with tape if the edges stop sticking to your skin. Check your PICC insertion site every day for signs of infection. Check for: Redness, swelling, or pain. Fluid or blood. Warmth. Pus or a bad smell. Preventing other problems Do not use a syringe that is less than 10 mL to flush the PICC. Do not have your blood pressure checked on the arm in which the PICC is placed. Do not ever pull or tug on the PICC. Keep it secured to your arm with tape or a stretch wrap when not in use. Do not take the PICC out yourself. Only a trained health care provider should remove the PICC. Keep pets and children away from your PICC. How to care for your PICC dressing Keep your PICC dressing clean and dry to prevent infection. Do not take baths, swim, or use a hot tub until your health care provider approves. Ask your health care provider if you can take  showers. You may only be allowed to take sponge baths. When you are allowed to shower: Ask your health care provider to teach you how to wrap the PICC. Cover the PICC with clear plastic wrap and tape to keep it dry while showering. Follow instructions from your health care provider about how to take care of your insertion site and dressing. Make sure you: Wash your hands with soap and water for at least 20 seconds before and after you change your dressing. If soap and water are not available, use hand sanitizer. Change your dressing only if taught to do so by your health care  provider. Your PICC dressing needs to be changed if it becomes loose or wet. Leave stitches (sutures), skin glue, or adhesive strips in place. These skin closures may need to stay in place for 2 weeks or longer. If adhesive strip edges start to loosen and curl up, you may trim the loose edges. Do not remove adhesive strips completely unless your health care provider tells you to do that. Follow these instructions at home: Disposal of supplies Throw away any syringes in a disposal container that is meant for sharp items (sharps container). You can buy a sharps container from a pharmacy, or you can make one by using an empty, hard plastic bottle with a lid. Place any used dressings or infusion bags into a plastic bag. Throw that bag in the trash. General instructions  Always carry your PICC identification card or wear a medical alert bracelet. Keep the tube clamped at all times, unless it is being used. Always carry a smooth-edge clamp with you to clamp the PICC if it breaks. Do not use scissors or sharp objects near the tube. You may bend your arm and move it freely. If your PICC is near or at the bend of your elbow, avoid activity with repeated motion at the elbow. Avoid lifting heavy objects as told by your health care provider. Keep all follow-up visits. This is important. You will need to have your PICC dressing changed at least once a week. Contact a health care provider if: You have pain in your arm, ear, face, or teeth. You have a fever or chills. You have redness, swelling, or pain around the insertion site. You have fluid or blood coming from the insertion site. Your insertion site feels warm to the touch. You have pus or a bad smell coming from the insertion site. Your skin feels hard and raised around the insertion site. Your PICC dressing has gotten wet or is coming off and you have not been taught how to change it. Get help right away if: You have problems with your PICC, such as  your PICC: Was tugged or pulled and has partially come out. Do not  push the PICC back in. Cannot be flushed, is hard to flush, or leaks around the insertion site when it is flushed. Makes a flushing sound when it is flushed. Appears to have a hole or tear. Is accidentally pulled all the way out. If this happens, cover the insertion site with a gauze dressing. Do not throw the PICC away. Your health care provider will need to check it to be sure the entire catheter came out. You feel your heart racing or skipping beats, or you have chest pain. You have shortness of breath or trouble breathing. You have swelling, redness, warmth, or pain in the arm in which the PICC is placed. You have a red streak going up your arm that  starts under the PICC dressing. These symptoms may be an emergency. Get help right away. Call 911. Do not wait to see if the symptoms will go away. Do not drive yourself to the hospital. Summary A peripherally inserted central catheter (PICC) is a long, thin, flexible tube (catheter) that is put into a vein in the arm or leg. If cared for properly, a PICC can remain in place for many months. Having a PICC can allow you to go home from the hospital sooner and continue treatment at home. The PICC is inserted using a germ-free (sterile) technique by a specially trained health care provider. Only a trained health care provider should remove it. Do not have your blood pressure checked on the arm in which your PICC is placed. Always keep your PICC identification card with you. This information is not intended to replace advice given to you by your health care provider. Make sure you discuss any questions you have with your health care provider. Document Revised: 07/18/2020 Document Reviewed: 07/18/2020 Elsevier Patient Education  2024 [employer].

## 2022-10-06 ENCOUNTER — Inpatient Hospital Stay: Payer: 59

## 2022-10-06 ENCOUNTER — Inpatient Hospital Stay (HOSPITAL_BASED_OUTPATIENT_CLINIC_OR_DEPARTMENT_OTHER): Payer: 59 | Admitting: Nurse Practitioner

## 2022-10-06 ENCOUNTER — Encounter: Payer: Self-pay | Admitting: Nurse Practitioner

## 2022-10-06 VITALS — BP 152/90 | HR 86 | Temp 98.1°F | Resp 18 | Ht 64.0 in | Wt 150.0 lb

## 2022-10-06 DIAGNOSIS — Z452 Encounter for adjustment and management of vascular access device: Secondary | ICD-10-CM

## 2022-10-06 DIAGNOSIS — C19 Malignant neoplasm of rectosigmoid junction: Secondary | ICD-10-CM | POA: Diagnosis not present

## 2022-10-06 DIAGNOSIS — Z5111 Encounter for antineoplastic chemotherapy: Secondary | ICD-10-CM | POA: Diagnosis not present

## 2022-10-06 LAB — CBC WITH DIFFERENTIAL (CANCER CENTER ONLY)
Abs Immature Granulocytes: 0.31 10*3/uL — ABNORMAL HIGH (ref 0.00–0.07)
Basophils Absolute: 0.1 10*3/uL (ref 0.0–0.1)
Basophils Relative: 1 %
Eosinophils Absolute: 0.7 10*3/uL — ABNORMAL HIGH (ref 0.0–0.5)
Eosinophils Relative: 5 %
HCT: 39.8 % (ref 36.0–46.0)
Hemoglobin: 13 g/dL (ref 12.0–15.0)
Immature Granulocytes: 2 %
Lymphocytes Relative: 21 %
Lymphs Abs: 2.9 10*3/uL (ref 0.7–4.0)
MCH: 30.8 pg (ref 26.0–34.0)
MCHC: 32.7 g/dL (ref 30.0–36.0)
MCV: 94.3 fL (ref 80.0–100.0)
Monocytes Absolute: 0.9 10*3/uL (ref 0.1–1.0)
Monocytes Relative: 6 %
Neutro Abs: 9 10*3/uL — ABNORMAL HIGH (ref 1.7–7.7)
Neutrophils Relative %: 65 %
Platelet Count: 221 10*3/uL (ref 150–400)
RBC: 4.22 MIL/uL (ref 3.87–5.11)
RDW: 14.7 % (ref 11.5–15.5)
WBC Count: 13.9 10*3/uL — ABNORMAL HIGH (ref 4.0–10.5)
nRBC: 0 % (ref 0.0–0.2)

## 2022-10-06 LAB — CMP (CANCER CENTER ONLY)
ALT: 31 U/L (ref 0–44)
AST: 39 U/L (ref 15–41)
Albumin: 4 g/dL (ref 3.5–5.0)
Alkaline Phosphatase: 218 U/L — ABNORMAL HIGH (ref 38–126)
Anion gap: 8 (ref 5–15)
BUN: 6 mg/dL (ref 6–20)
CO2: 28 mmol/L (ref 22–32)
Calcium: 9.1 mg/dL (ref 8.9–10.3)
Chloride: 103 mmol/L (ref 98–111)
Creatinine: 0.5 mg/dL (ref 0.44–1.00)
GFR, Estimated: >60 mL/min (ref >60)
Glucose, Bld: 83 mg/dL (ref 70–99)
Potassium: 3.2 mmol/L — ABNORMAL LOW (ref 3.5–5.1)
Sodium: 139 mmol/L (ref 135–145)
Total Bilirubin: 0.2 mg/dL — ABNORMAL LOW (ref 0.3–1.2)
Total Protein: 7.7 g/dL (ref 6.5–8.1)

## 2022-10-06 LAB — CEA (ACCESS): CEA (CHCC): 2.63 ng/mL (ref 0.00–5.00)

## 2022-10-06 MED ORDER — ALTEPLASE 2 MG IJ SOLR
2.0000 mg | Freq: Once | INTRAMUSCULAR | Status: AC
  Administered 2022-10-06: 2 mg
  Filled 2022-10-06: qty 2

## 2022-10-06 NOTE — Patient Instructions (Signed)
PICC Home Care Guide A peripherally inserted central catheter (PICC) is a form of IV access that allows medicines and IV fluids to be quickly put into the blood and spread throughout the body. The PICC is a long, thin, flexible tube (catheter) that is put into a vein in a person's arm or leg. The catheter ends in a large vein just outside the heart called the superior vena cava (SVC). After the PICC is put in, a chest X-ray may be done to make sure that it is in the right place. A PICC may be placed for different reasons, such as: To give medicines and liquid nutrition. To give IV fluids and blood products. To take blood samples often. If there is trouble placing a peripheral intravenous (PIV) catheter. If cared for properly, a PICC can remain in place for many months. Having a PICC can allow you to go home from the hospital sooner and continue treatment at home. Medicines and PICC care can be managed at home by a family member, caregiver, or home health care team. What are the risks? Generally, having a PICC is safe. However, problems may occur, including: A blood clot (thrombus) forming in or at the end of the PICC. A blood clot forming in a vein (deep vein thrombosis) or traveling to the lung (pulmonary embolism). Inflammation of the vein (phlebitis) in which the PICC is placed. Infection at the insertion site or in the blood. Blood infections from central lines, like PICCs, can be serious and often require a hospital stay. PICC malposition, or PICC movement or poor placement. A break or cut in the PICC. Do not use scissors near the PICC. Nerve or tendon irritation or injury during PICC insertion. How to care for your PICC Please follow the specific guidelines provided by your health care provider. Preventing infection You and any caregivers should wash your hands often with soap and water for at least 20 seconds. Wash hands: Before touching the PICC or the infusion device. Before changing a  bandage (dressing). Do not change the dressing unless you have been taught to do so and have shown you are able to change it safely. Flush the PICC as told. Tell your health care provider right away if the PICC is hard to flush or does not flush. Do not use force to flush the PICC. Use clean and germ-free (sterile) supplies only. Keep the supplies in a dry place. Do not reuse needles, syringes, or any other supplies. Reusing supplies can lead to infection. Keep the PICC dressing dry and secure it with tape if the edges stop sticking to your skin. Check your PICC insertion site every day for signs of infection. Check for: Redness, swelling, or pain. Fluid or blood. Warmth. Pus or a bad smell. Preventing other problems Do not use a syringe that is less than 10 mL to flush the PICC. Do not have your blood pressure checked on the arm in which the PICC is placed. Do not ever pull or tug on the PICC. Keep it secured to your arm with tape or a stretch wrap when not in use. Do not take the PICC out yourself. Only a trained health care provider should remove the PICC. Keep pets and children away from your PICC. How to care for your PICC dressing Keep your PICC dressing clean and dry to prevent infection. Do not take baths, swim, or use a hot tub until your health care provider approves. Ask your health care provider if you can take  showers. You may only be allowed to take sponge baths. When you are allowed to shower: Ask your health care provider to teach you how to wrap the PICC. Cover the PICC with clear plastic wrap and tape to keep it dry while showering. Follow instructions from your health care provider about how to take care of your insertion site and dressing. Make sure you: Wash your hands with soap and water for at least 20 seconds before and after you change your dressing. If soap and water are not available, use hand sanitizer. Change your dressing only if taught to do so by your health care  provider. Your PICC dressing needs to be changed if it becomes loose or wet. Leave stitches (sutures), skin glue, or adhesive strips in place. These skin closures may need to stay in place for 2 weeks or longer. If adhesive strip edges start to loosen and curl up, you may trim the loose edges. Do not remove adhesive strips completely unless your health care provider tells you to do that. Follow these instructions at home: Disposal of supplies Throw away any syringes in a disposal container that is meant for sharp items (sharps container). You can buy a sharps container from a pharmacy, or you can make one by using an empty, hard plastic bottle with a lid. Place any used dressings or infusion bags into a plastic bag. Throw that bag in the trash. General instructions  Always carry your PICC identification card or wear a medical alert bracelet. Keep the tube clamped at all times, unless it is being used. Always carry a smooth-edge clamp with you to clamp the PICC if it breaks. Do not use scissors or sharp objects near the tube. You may bend your arm and move it freely. If your PICC is near or at the bend of your elbow, avoid activity with repeated motion at the elbow. Avoid lifting heavy objects as told by your health care provider. Keep all follow-up visits. This is important. You will need to have your PICC dressing changed at least once a week. Contact a health care provider if: You have pain in your arm, ear, face, or teeth. You have a fever or chills. You have redness, swelling, or pain around the insertion site. You have fluid or blood coming from the insertion site. Your insertion site feels warm to the touch. You have pus or a bad smell coming from the insertion site. Your skin feels hard and raised around the insertion site. Your PICC dressing has gotten wet or is coming off and you have not been taught how to change it. Get help right away if: You have problems with your PICC, such as  your PICC: Was tugged or pulled and has partially come out. Do not  push the PICC back in. Cannot be flushed, is hard to flush, or leaks around the insertion site when it is flushed. Makes a flushing sound when it is flushed. Appears to have a hole or tear. Is accidentally pulled all the way out. If this happens, cover the insertion site with a gauze dressing. Do not throw the PICC away. Your health care provider will need to check it to be sure the entire catheter came out. You feel your heart racing or skipping beats, or you have chest pain. You have shortness of breath or trouble breathing. You have swelling, redness, warmth, or pain in the arm in which the PICC is placed. You have a red streak going up your arm that  starts under the PICC dressing. These symptoms may be an emergency. Get help right away. Call 911. Do not wait to see if the symptoms will go away. Do not drive yourself to the hospital. Summary A peripherally inserted central catheter (PICC) is a long, thin, flexible tube (catheter) that is put into a vein in the arm or leg. If cared for properly, a PICC can remain in place for many months. Having a PICC can allow you to go home from the hospital sooner and continue treatment at home. The PICC is inserted using a germ-free (sterile) technique by a specially trained health care provider. Only a trained health care provider should remove it. Do not have your blood pressure checked on the arm in which your PICC is placed. Always keep your PICC identification card with you. This information is not intended to replace advice given to you by your health care provider. Make sure you discuss any questions you have with your health care provider. Document Revised: 07/18/2020 Document Reviewed: 07/18/2020 Elsevier Patient Education  2024 [employer].

## 2022-10-06 NOTE — Progress Notes (Signed)
[facility] OFFICE PROGRESS NOTE   Diagnosis: Colon cancer  INTERVAL HISTORY:   Ms. Sinkler returns as scheduled.  She completed cycle 11 FOLFOX 09/22/2022.  She does not feel she tolerated the treatment well.  She had nausea/vomiting beginning day 2.  This lasted for about a week.  No mouth sores.  No diarrhea.  Cold sensitivity for 4 to 5 days.  She has intermittent numbness/tingling in the toes.  Toes periodically feel swollen.  She does not want to do treatment today.  Objective:  Vital signs in last 24 hours:  Blood pressure (!) 152/90, pulse 86, temperature 98.1 F (36.7 C), temperature source Temporal, resp. rate 18, height 5\' 4"  (1.626 m), weight 150 lb (68 kg), last menstrual period 01/07/2006, SpO2 100%.    HEENT: White coating over tongue.  No buccal thrush. Resp: Lungs clear bilaterally. Cardio: Regular rate and rhythm. GI: Abdomen soft, tender right upper abdomen.  No hepatosplenomegaly. Vascular: No leg edema. Neuro: Vibratory sense intact over the fingertips for tuning fork exam. Skin: Palms without erythema. Left upper extremity PICC without erythema.  Lab Results:  Lab Results  Component Value Date   WBC 13.9 (H) 10/06/2022   HGB 13.0 10/06/2022   HCT 39.8 10/06/2022   MCV 94.3 10/06/2022   PLT 221 10/06/2022   NEUTROABS 9.0 (H) 10/06/2022    Imaging:  No results found.  Medications: I have reviewed the patient's current medications.  Assessment/Plan: Sigmoid colon cancer, stage IV (pT4a,pN2b,M1c) Colonoscopy 03/24/2019-3 rectal polyps-hyperplastic polyps, distal colon biopsy-at least intramucosal adenocarcinoma, completely obstructing mass in the distal sigmoid colon, could not be traversed, intact mismatch repair protein expression 03/24/2019-CEA 56.1 03/29/2019 CT abdomen/pelvis-circumferential thickening involving the entire mid and distal sigmoid colon to the level of the rectosigmoid junction, solid/cystic mass in the left ovary,  bilateral nephrolithiasis Robotic assisted low anterior resection, mesenteric lymphadenectomy, bilateral salpingo-oophorectomy 04/08/2019 Pathology ([facility] review of outside pathology) metastatic adenocarcinoma involving the peritoneum overlying the round ligament, serosa of the urinary bladder, serosa of the right ureter a sacral area, pelvic peritoneum, and left ovary.  Omental biopsy with focal mucin pools with no carcinoma cells identified, right hemidiaphragm biopsy involved by metastatic adenocarcinoma, invasive adenocarcinoma the sigmoid colon, moderately differentiated, T4a, perineural and vascular invasion present, 7/22 lymph nodes, multiple tumor deposits, resection margins negative Negative for PD-L1, low probability of MSI-high, HER-2 negative, negative for BRAF, NRAS and KRAS alterations CTs 05/19/2019-no evidence of metastatic disease, findings suspicious for colitis of the transverse and ascending colon, small amount of ascites in the cul-de-sac, bilateral renal calculi Cycle 1 FOLFIRI 05/24/2019, bevacizumab added with cycle 2 Cycle 4 FOLFIRI/bevacizumab 07/05/2019 Cycle 5 FOLFIRI 07/27/2019, bevacizumab held secondary to hypertension Cycle 6 FOLFIRI 08/10/1999, bevacizumab held secondary to hypertension CTs 08/30/2019-no evidence of recurrent disease, new subsolid right lower lobe nodule felt to be inflammatory, emphysema Cycle 7 FOLFIRI 09/26/2019, bevacizumab remains on hold secondary to hypertension Cycle 8 FOLFIRI 10/17/2019, bevacizumab held secondary to hypertension, Udenyca added for neutropenia Cycle 9 FOLFIRI 11/07/2019, bevacizumab held secondary to hypertension, Udenyca Cycle 10 FOLFIRI 11/28/2019, bevacizumab held, Udenyca Cycle 11 FOLFIRI 12/26/2019, bevacizumab held, Udenyca CTs 01/25/2020-no evidence of recurrent disease, resolution of right lower lobe nodule Maintenance Xeloda beginning 02/06/2020 CTs 04/25/2020- no evidence of metastatic disease, multiple bilateral renal calculi  without hydronephrosis Maintenance Xeloda continued CT abdomen/pelvis without contrast 08/10/2020-bilateral staghorn renal calculi, no evidence of metastatic disease; addendum 08/23/2020-small but increasing omental nodules. Cycle 1 FOLFIRI/panitumumab 09/19/2020 Cycle 2 FOLFIRI/Panitumumab 10/03/2020 Cycle 3 FOLFIRI/Panitumumab 10/17/2020  Cycle 4 FOLFIRI/panitumumab 10/31/2020 Cycle 5 FOLFIRI/Panitumumab 11/14/2020 CTs 11/26/2020-stable omental metastases compared to 10/22/2020, mildly decreased from 08/10/2020.  Left lower quadrant tiny paracolic gutter implant slightly decreased from CT 08/10/2020.  No new or progressive metastatic disease in the abdomen or pelvis. Cycle 6 FOLFIRI/Panitumumab 11/28/2020, irinotecan dose reduced, treatment schedule adjusted to every 3 weeks going forward Cycle 7 FOLFIRI/panitumumab 12/24/2020 Cycle 8 FOLFIRI/Panitumumab 01/16/2021--treatment held due to hypertension in the infusion area. Cycle 8 FOLFIRI/panitumumab 01/21/2021 Cycle 9 FOLFIRI/panitumumab 02/11/2021 Cycle 10 FOLFIRI/Panitumumab 03/04/2021 Cycle 11 FOLFIRI/Panitumumab 03/25/2021 CT abdomen/pelvis 04/10/2021-stable right omental and left posterior paracolic gutter nodules Cycle 12 FOLFIRI/panitumumab 04/15/2021 Cycle 13 FOLFIRI/panitumumab 05/06/2021 Cycle 14 FOLFIRI/Panitumumab 05/27/2021 Cycle 15 FOLFIRI/Panitumumab 06/17/2021 CT abdomen/pelvis 07/05/2021-slight enlargement of a dominant right omental implant, no new implants Patient requested a treatment break CT abdomen/pelvis 10/04/2021-mild increase in size of multiple omental soft tissue nodules, 4 mm calculus in the distal left ureter adjacent to the ureteral stent Cycle 1 Lonsurf 10/14/2021 10/28/2021 Avastin every 2 weeks Cycle 2 Lonsurf 11/11/2021 Cycle 3 Lonsurf 12/09/2021 Cycle 4 Lonsurf 01/06/2022 Avastin held 01/20/2022 due to proteinuria, 24-hour urine 43 mg CT/pelvis 01/30/2022-possible new peritoneal implant at the transverse colon, no significant  change in other omental/peritoneal implants, stable chronic rectal wall thickening, status post removal of double-J left ureteral stent with no evidence of hydronephrosis or ureteral calculus, nonobstructing bilateral renal calculi Cycle 5 Lonsurf 02/05/2022 or 02/06/2022 Avastin every 2 weeks Cycle 6 Lonsurf 03/03/2022 CTs 04/05/2022-increase in size and number of peritoneal implants compared to January 2024 central liver mass narrowing the anterior branch of the right portal vein, decreased biliary duct dilation, biliary stents in place, stable right cardiophrenic lymph nodes, separate pigtail stent in the lumen of the proximal duodenum Cycle 1 FOLFOX 04/14/2022 CT abdomen/pelvis 04/16/2022-stent within the third portion of the duodenum, stable peritoneal nodules, indwelling biliary stents with mild left biliary duct dilation Cycle 2 FOLFOX 04/28/2022, Emend and prophylactic dexamethasone added Cycle 3 FOLFOX 05/12/2022, Aloxi, Emend, prophylactic dexamethasone, Compazine and lorazepam as needed Chemotherapy held 05/25/2020 due to severe hypertension, evaluated in the emergency department Cycle 4 FOLFOX 06/03/2022 Cycle 5 FOLFOX 06/16/2022, oxaliplatin dose reduced and 5-FU bolus eliminated due to neutropenia CT 06/25/2022-unchanged soft tissue at the hepatic hilum, unchanged, bile duct stent, decreased size of peritoneal and omental nodules and diminished volume of likely fluid in the lower left pelvis Cycle 6 FOLFOX 06/30/2022 Cycle 7 FOLFOX 07/21/2022 CT 07/24/2022 (in the emergency department with nausea, headache, abdominal pain)-no acute pathology.  No significant interval change in omental nodularity. Cycle 8 FOLFOX 08/04/2022, pruritus on the palms at the end of the oxaliplatin infusion, Pepcid and Benadryl given, symptoms resolved, infusion not resumed Cycle 9 FOLFOX 08/18/2022-Pepcid and Benadryl added to premedications, Oxaliplatin diluted in a larger volume and infusion time increased Cycle 10 FOLFOX  09/08/2022 Cycle 11 FOLFOX 09/22/2022 10/06/2022 patient declined treatment   Hypertension G4 P3, twins Kidney stones-bilateral staghorn renal calculi on CT 08/10/2020 Infected Port-A-Cath 09/12/2019-placed on Augmentin, referred for Port-A-Cath removal; Port-A-Cath removed 09/14/2019; PICC line placed 09/14/2019; culture staph aureus.  Course of Septra completed. Neutropenia secondary to chemotherapy-Udenyca added with cycle 8 FOLFIRI Right nephrostomy tube 09/12/2020; stent placed 10/09/2020, percutaneous nephrostolithotomy treatment of right-sided kidney stones 10/09/2020, malpositioned right ureter stent replacement 10/23/2020, right ureter stent removed 10/29/2020 Percutaneous left nephrostolithotomy 10/01/2021-no renal stone identified, left ureter stent left in place Cystoscopy 10/14/2021-stent removed 8.  Admission 03/21/2022 with new onset jaundice, abdominal pain, nausea MRI abdomen 03/22/2022-central liver mass with obstruction at the confluence  of the intrahepatic ducts and proximal common hepatic duct with severe Intermatic biliary ductal dilatation, progressive peritoneal carcinomatosis ERCP 03/24/2022-severe biliary stricture in the hepatic duct affecting the left and right hepatic duct, common hepatic duct, and bifurcation, malignant appearing, temporary plastic pancreatic stent, left and right hepatic duct stents were placed 9.  Admission 04/16/2022 with nausea/vomiting and abdominal pain, felt to be acute toxicity related to chemotherapy, discharged home 04/18/2022    Disposition: Ms. Pignotti appears unchanged.  She has completed 11 cycles of FOLFOX.  She declines cycle 12 which is scheduled 10/07/2022.  She would like to go ahead with restaging CT scans next week and follow-up as scheduled in 2 weeks.    She is developing Oxaliplatin neuropathy.  Continue to monitor.  Hopefully there will be some improvement when she returns in 2 weeks.  CBC and chemistry panel reviewed.  She has mild hypokalemia.   She has not been taking the potassium supplement.  She indicates she will resume.  She will return for follow-up, possible treatment in 2 weeks.  She will contact the office in the interim with any problems.    Lonna Cobb ANP/GNP-BC   10/06/2022  11:19 AM

## 2022-10-07 ENCOUNTER — Inpatient Hospital Stay: Payer: 59

## 2022-10-08 ENCOUNTER — Ambulatory Visit: Payer: 59

## 2022-10-09 ENCOUNTER — Telehealth: Payer: Self-pay

## 2022-10-09 ENCOUNTER — Inpatient Hospital Stay: Payer: 59

## 2022-10-09 ENCOUNTER — Telehealth: Payer: Self-pay | Admitting: Gastroenterology

## 2022-10-09 VITALS — BP 140/85 | HR 84 | Temp 98.2°F | Resp 18

## 2022-10-09 DIAGNOSIS — Z95828 Presence of other vascular implants and grafts: Secondary | ICD-10-CM

## 2022-10-09 DIAGNOSIS — Z5111 Encounter for antineoplastic chemotherapy: Secondary | ICD-10-CM | POA: Diagnosis not present

## 2022-10-09 DIAGNOSIS — C20 Malignant neoplasm of rectum: Secondary | ICD-10-CM

## 2022-10-09 MED ORDER — HEPARIN SOD (PORK) LOCK FLUSH 100 UNIT/ML IV SOLN
500.0000 [IU] | Freq: Once | INTRAVENOUS | Status: AC
  Administered 2022-10-09: 500 [IU]

## 2022-10-09 MED ORDER — SODIUM CHLORIDE 0.9% FLUSH
10.0000 mL | Freq: Once | INTRAVENOUS | Status: AC
  Administered 2022-10-09: 10 mL

## 2022-10-09 MED ORDER — ALTEPLASE 2 MG IJ SOLR
2.0000 mg | Freq: Once | INTRAMUSCULAR | Status: AC
  Administered 2022-10-09: 2 mg
  Filled 2022-10-09: qty 2

## 2022-10-09 NOTE — Telephone Encounter (Signed)
Inbound call from patient wishing to discuss scheduling of recall ERCP. Requesting a call back. Please advise, thank you.

## 2022-10-09 NOTE — Telephone Encounter (Signed)
[facility] CSW Progress Note  Patient called Clinical Social Worker attempting to reach CSW L. Duffy with questions about Christmas Assistance. CSW provided patient available information and will relay message to CSW to inform of call and add to list. Patient will attempt to retrieve CSWs card at [facility] with updated contact.  Marguerita Merles, LCSWA Clinical Social Worker [facility]

## 2022-10-10 NOTE — Telephone Encounter (Signed)
I spoke with the pt and advised that there is a recall in the system for ERCP and Colon. She tells me that she would like an appt to discuss this with Dr Meridee Score in the office. Appt made for next available in Dec.

## 2022-10-11 ENCOUNTER — Other Ambulatory Visit: Payer: Self-pay

## 2022-10-15 ENCOUNTER — Encounter: Payer: Self-pay | Admitting: Oncology

## 2022-10-15 ENCOUNTER — Inpatient Hospital Stay: Payer: 59 | Attending: Oncology

## 2022-10-15 ENCOUNTER — Ambulatory Visit (HOSPITAL_COMMUNITY)
Admission: RE | Admit: 2022-10-15 | Discharge: 2022-10-15 | Disposition: A | Discharge status: Home / Self Care | Payer: 59 | Source: Ambulatory Visit | Attending: Nurse Practitioner | Admitting: Nurse Practitioner

## 2022-10-15 DIAGNOSIS — G620 Drug-induced polyneuropathy: Secondary | ICD-10-CM | POA: Insufficient documentation

## 2022-10-15 DIAGNOSIS — Z452 Encounter for adjustment and management of vascular access device: Secondary | ICD-10-CM | POA: Insufficient documentation

## 2022-10-15 DIAGNOSIS — C19 Malignant neoplasm of rectosigmoid junction: Secondary | ICD-10-CM | POA: Insufficient documentation

## 2022-10-15 DIAGNOSIS — C786 Secondary malignant neoplasm of retroperitoneum and peritoneum: Secondary | ICD-10-CM | POA: Insufficient documentation

## 2022-10-15 DIAGNOSIS — C187 Malignant neoplasm of sigmoid colon: Secondary | ICD-10-CM | POA: Insufficient documentation

## 2022-10-15 DIAGNOSIS — T451X5A Adverse effect of antineoplastic and immunosuppressive drugs, initial encounter: Secondary | ICD-10-CM | POA: Insufficient documentation

## 2022-10-15 DIAGNOSIS — I10 Essential (primary) hypertension: Secondary | ICD-10-CM | POA: Insufficient documentation

## 2022-10-15 DIAGNOSIS — E876 Hypokalemia: Secondary | ICD-10-CM | POA: Insufficient documentation

## 2022-10-15 DIAGNOSIS — K5900 Constipation, unspecified: Secondary | ICD-10-CM | POA: Insufficient documentation

## 2022-10-15 MED ORDER — IOHEXOL 9 MG/ML PO SOLN
1000.0000 mL | Freq: Once | ORAL | Status: AC
  Administered 2022-10-15: 1000 mL via ORAL

## 2022-10-15 MED ORDER — HEPARIN SOD (PORK) LOCK FLUSH 100 UNIT/ML IV SOLN
INTRAVENOUS | Status: AC
  Filled 2022-10-15: qty 5

## 2022-10-15 MED ORDER — IOHEXOL 9 MG/ML PO SOLN
ORAL | Status: AC
  Filled 2022-10-15: qty 1000

## 2022-10-15 MED ORDER — HEPARIN SOD (PORK) LOCK FLUSH 100 UNIT/ML IV SOLN
500.0000 [IU] | Freq: Once | INTRAVENOUS | Status: AC
  Administered 2022-10-15: 500 [IU] via INTRAVENOUS

## 2022-10-15 MED ORDER — SODIUM CHLORIDE (PF) 0.9 % IJ SOLN
INTRAMUSCULAR | Status: AC
  Filled 2022-10-15: qty 50

## 2022-10-15 MED ORDER — IOHEXOL 300 MG/ML  SOLN
100.0000 mL | Freq: Once | INTRAMUSCULAR | Status: AC | PRN
  Administered 2022-10-15: 100 mL via INTRAVENOUS

## 2022-10-16 ENCOUNTER — Inpatient Hospital Stay: Payer: 59

## 2022-10-16 DIAGNOSIS — K5900 Constipation, unspecified: Secondary | ICD-10-CM | POA: Diagnosis not present

## 2022-10-16 DIAGNOSIS — E876 Hypokalemia: Secondary | ICD-10-CM | POA: Diagnosis not present

## 2022-10-16 DIAGNOSIS — C786 Secondary malignant neoplasm of retroperitoneum and peritoneum: Secondary | ICD-10-CM | POA: Diagnosis present

## 2022-10-16 DIAGNOSIS — Z452 Encounter for adjustment and management of vascular access device: Secondary | ICD-10-CM | POA: Diagnosis not present

## 2022-10-16 DIAGNOSIS — G620 Drug-induced polyneuropathy: Secondary | ICD-10-CM | POA: Diagnosis not present

## 2022-10-16 DIAGNOSIS — I10 Essential (primary) hypertension: Secondary | ICD-10-CM | POA: Diagnosis not present

## 2022-10-16 DIAGNOSIS — T451X5A Adverse effect of antineoplastic and immunosuppressive drugs, initial encounter: Secondary | ICD-10-CM | POA: Diagnosis not present

## 2022-10-16 DIAGNOSIS — Z95828 Presence of other vascular implants and grafts: Secondary | ICD-10-CM

## 2022-10-16 DIAGNOSIS — C187 Malignant neoplasm of sigmoid colon: Secondary | ICD-10-CM | POA: Diagnosis present

## 2022-10-16 DIAGNOSIS — C20 Malignant neoplasm of rectum: Secondary | ICD-10-CM

## 2022-10-16 MED ORDER — SODIUM CHLORIDE 0.9% FLUSH
10.0000 mL | Freq: Once | INTRAVENOUS | Status: AC
  Administered 2022-10-16: 10 mL

## 2022-10-16 MED ORDER — HEPARIN SOD (PORK) LOCK FLUSH 100 UNIT/ML IV SOLN
500.0000 [IU] | Freq: Once | INTRAVENOUS | Status: AC
  Administered 2022-10-16: 250 [IU]

## 2022-10-20 ENCOUNTER — Other Ambulatory Visit (HOSPITAL_BASED_OUTPATIENT_CLINIC_OR_DEPARTMENT_OTHER): Payer: Self-pay

## 2022-10-20 ENCOUNTER — Inpatient Hospital Stay (HOSPITAL_BASED_OUTPATIENT_CLINIC_OR_DEPARTMENT_OTHER): Payer: 59 | Admitting: Oncology

## 2022-10-20 ENCOUNTER — Other Ambulatory Visit: Payer: Self-pay

## 2022-10-20 ENCOUNTER — Telehealth: Payer: Self-pay

## 2022-10-20 ENCOUNTER — Inpatient Hospital Stay: Payer: 59

## 2022-10-20 ENCOUNTER — Telehealth: Payer: Self-pay | Admitting: Pharmacist

## 2022-10-20 ENCOUNTER — Other Ambulatory Visit (HOSPITAL_COMMUNITY): Payer: Self-pay

## 2022-10-20 ENCOUNTER — Encounter: Payer: Self-pay | Admitting: Oncology

## 2022-10-20 DIAGNOSIS — Z95828 Presence of other vascular implants and grafts: Secondary | ICD-10-CM

## 2022-10-20 DIAGNOSIS — C19 Malignant neoplasm of rectosigmoid junction: Secondary | ICD-10-CM

## 2022-10-20 DIAGNOSIS — C187 Malignant neoplasm of sigmoid colon: Secondary | ICD-10-CM | POA: Diagnosis not present

## 2022-10-20 DIAGNOSIS — C786 Secondary malignant neoplasm of retroperitoneum and peritoneum: Secondary | ICD-10-CM

## 2022-10-20 DIAGNOSIS — C20 Malignant neoplasm of rectum: Secondary | ICD-10-CM

## 2022-10-20 LAB — CBC WITH DIFFERENTIAL (CANCER CENTER ONLY)
Abs Immature Granulocytes: 0.01 10*3/uL (ref 0.00–0.07)
Basophils Absolute: 0.1 10*3/uL (ref 0.0–0.1)
Basophils Relative: 1 %
Eosinophils Absolute: 0.6 10*3/uL — ABNORMAL HIGH (ref 0.0–0.5)
Eosinophils Relative: 10 %
HCT: 37.1 % (ref 36.0–46.0)
Hemoglobin: 12.2 g/dL (ref 12.0–15.0)
Immature Granulocytes: 0 %
Lymphocytes Relative: 35 %
Lymphs Abs: 2.1 10*3/uL (ref 0.7–4.0)
MCH: 31.4 pg (ref 26.0–34.0)
MCHC: 32.9 g/dL (ref 30.0–36.0)
MCV: 95.4 fL (ref 80.0–100.0)
Monocytes Absolute: 0.5 10*3/uL (ref 0.1–1.0)
Monocytes Relative: 9 %
Neutro Abs: 2.9 10*3/uL (ref 1.7–7.7)
Neutrophils Relative %: 45 %
Platelet Count: 222 10*3/uL (ref 150–400)
RBC: 3.89 MIL/uL (ref 3.87–5.11)
RDW: 15.1 % (ref 11.5–15.5)
WBC Count: 6.2 10*3/uL (ref 4.0–10.5)
nRBC: 0 % (ref 0.0–0.2)

## 2022-10-20 LAB — CMP (CANCER CENTER ONLY)
ALT: 10 U/L (ref 0–44)
AST: 14 U/L — ABNORMAL LOW (ref 15–41)
Albumin: 4 g/dL (ref 3.5–5.0)
Alkaline Phosphatase: 149 U/L — ABNORMAL HIGH (ref 38–126)
Anion gap: 9 (ref 5–15)
BUN: 16 mg/dL (ref 6–20)
CO2: 26 mmol/L (ref 22–32)
Calcium: 9.2 mg/dL (ref 8.9–10.3)
Chloride: 104 mmol/L (ref 98–111)
Creatinine: 0.65 mg/dL (ref 0.44–1.00)
GFR, Estimated: >60 mL/min (ref >60)
Glucose, Bld: 147 mg/dL — ABNORMAL HIGH (ref 70–99)
Potassium: 3.3 mmol/L — ABNORMAL LOW (ref 3.5–5.1)
Sodium: 139 mmol/L (ref 135–145)
Total Bilirubin: 0.2 mg/dL — ABNORMAL LOW (ref 0.3–1.2)
Total Protein: 7.5 g/dL (ref 6.5–8.1)

## 2022-10-20 LAB — CEA (ACCESS): CEA (CHCC): 2.66 ng/mL (ref 0.00–5.00)

## 2022-10-20 MED ORDER — HEPARIN SOD (PORK) LOCK FLUSH 100 UNIT/ML IV SOLN
500.0000 [IU] | Freq: Once | INTRAVENOUS | Status: AC
  Administered 2022-10-20: 500 [IU]

## 2022-10-20 MED ORDER — SODIUM CHLORIDE 0.9% FLUSH
10.0000 mL | Freq: Once | INTRAVENOUS | Status: AC
  Administered 2022-10-20: 10 mL

## 2022-10-20 MED ORDER — HYDROCODONE-ACETAMINOPHEN 10-325 MG PO TABS
1.0000 | ORAL_TABLET | Freq: Four times a day (QID) | ORAL | 0 refills | Status: DC | PRN
  Filled 2022-10-20: qty 60, 8d supply, fill #0

## 2022-10-20 MED ORDER — CAPECITABINE 500 MG PO TABS
1000.0000 mg | ORAL_TABLET | Freq: Two times a day (BID) | ORAL | 0 refills | Status: DC
  Filled 2022-10-20: qty 120, 30d supply, fill #0

## 2022-10-20 MED ORDER — CAPECITABINE 500 MG PO TABS: ORAL_TABLET | ORAL | 0 refills | Status: DC

## 2022-10-20 NOTE — Progress Notes (Signed)
[facility] OFFICE PROGRESS NOTE   Diagnosis: Colon cancer  INTERVAL HISTORY:   Ms. Sawtell returns as scheduled.  She has intermittent abdominal discomfort.  She is waiting to hear from GI regarding scheduling of an ERCP.  She has numbness in the feet.  Objective:  Vital signs in last 24 hours:  Blood pressure (!) 145/86, pulse 79, temperature 98.1 F (36.7 C), temperature source Temporal, resp. rate 18, height 5\' 4"  (1.626 m), weight 152 lb 8 oz (69.2 kg), last menstrual period 01/07/2006, SpO2 99%.    HEENT: No thrush, linear ulcer at the left side of the tongue Lymphatics: No cervical, supraclavicular, axillary, or inguinal nodes Resp: Lungs clear bilaterally Cardio: Regular rate and rhythm GI: No hepatosplenomegaly, no mass, no apparent ascites Vascular: No leg edema, the left lower leg is larger than the right side   Portacath/PICC-without erythema  Lab Results:  Lab Results  Component Value Date   WBC 6.2 10/20/2022   HGB 12.2 10/20/2022   HCT 37.1 10/20/2022   MCV 95.4 10/20/2022   PLT 222 10/20/2022   NEUTROABS 2.9 10/20/2022    CMP  Lab Results  Component Value Date   NA 139 10/20/2022   K 3.3 (L) 10/20/2022   CL 104 10/20/2022   CO2 26 10/20/2022   GLUCOSE 147 (H) 10/20/2022   BUN 16 10/20/2022   CREATININE 0.65 10/20/2022   CALCIUM 9.2 10/20/2022   PROT 7.5 10/20/2022   ALBUMIN 4.0 10/20/2022   AST 14 (L) 10/20/2022   ALT 10 10/20/2022   ALKPHOS 149 (H) 10/20/2022   BILITOT 0.2 (L) 10/20/2022   GFRNONAA >60 10/20/2022   GFRAA >60 10/17/2019    Lab Results  Component Value Date   CEA1 <0.6 03/23/2022   CEA 2.66 10/20/2022   WUJ811 213 (H) 03/23/2022     Medications: I have reviewed the patient's current medications.   Assessment/Plan: Sigmoid colon cancer, stage IV (pT4a,pN2b,M1c) Colonoscopy 03/24/2019-3 rectal polyps-hyperplastic polyps, distal colon biopsy-at least intramucosal adenocarcinoma, completely  obstructing mass in the distal sigmoid colon, could not be traversed, intact mismatch repair protein expression 03/24/2019-CEA 56.1 03/29/2019 CT abdomen/pelvis-circumferential thickening involving the entire mid and distal sigmoid colon to the level of the rectosigmoid junction, solid/cystic mass in the left ovary, bilateral nephrolithiasis Robotic assisted low anterior resection, mesenteric lymphadenectomy, bilateral salpingo-oophorectomy 04/08/2019 Pathology ([facility] review of outside pathology) metastatic adenocarcinoma involving the peritoneum overlying the round ligament, serosa of the urinary bladder, serosa of the right ureter a sacral area, pelvic peritoneum, and left ovary.  Omental biopsy with focal mucin pools with no carcinoma cells identified, right hemidiaphragm biopsy involved by metastatic adenocarcinoma, invasive adenocarcinoma the sigmoid colon, moderately differentiated, T4a, perineural and vascular invasion present, 7/22 lymph nodes, multiple tumor deposits, resection margins negative Negative for PD-L1, low probability of MSI-high, HER-2 negative, negative for BRAF, NRAS and KRAS alterations CTs 05/19/2019-no evidence of metastatic disease, findings suspicious for colitis of the transverse and ascending colon, small amount of ascites in the cul-de-sac, bilateral renal calculi Cycle 1 FOLFIRI 05/24/2019, bevacizumab added with cycle 2 Cycle 4 FOLFIRI/bevacizumab 07/05/2019 Cycle 5 FOLFIRI 07/27/2019, bevacizumab held secondary to hypertension Cycle 6 FOLFIRI 08/10/1999, bevacizumab held secondary to hypertension CTs 08/30/2019-no evidence of recurrent disease, new subsolid right lower lobe nodule felt to be inflammatory, emphysema Cycle 7 FOLFIRI 09/26/2019, bevacizumab remains on hold secondary to hypertension Cycle 8 FOLFIRI 10/17/2019, bevacizumab held secondary to hypertension, Udenyca added for neutropenia Cycle 9 FOLFIRI 11/07/2019, bevacizumab held secondary to hypertension, Udenyca Cycle  10 FOLFIRI 11/28/2019, bevacizumab held, Udenyca Cycle 11 FOLFIRI 12/26/2019, bevacizumab held, Udenyca CTs 01/25/2020-no evidence of recurrent disease, resolution of right lower lobe nodule Maintenance Xeloda beginning 02/06/2020 CTs 04/25/2020- no evidence of metastatic disease, multiple bilateral renal calculi without hydronephrosis Maintenance Xeloda continued CT abdomen/pelvis without contrast 08/10/2020-bilateral staghorn renal calculi, no evidence of metastatic disease; addendum 08/23/2020-small but increasing omental nodules. Cycle 1 FOLFIRI/panitumumab 09/19/2020 Cycle 2 FOLFIRI/Panitumumab 10/03/2020 Cycle 3 FOLFIRI/Panitumumab 10/17/2020 Cycle 4 FOLFIRI/panitumumab 10/31/2020 Cycle 5 FOLFIRI/Panitumumab 11/14/2020 CTs 11/26/2020-stable omental metastases compared to 10/22/2020, mildly decreased from 08/10/2020.  Left lower quadrant tiny paracolic gutter implant slightly decreased from CT 08/10/2020.  No new or progressive metastatic disease in the abdomen or pelvis. Cycle 6 FOLFIRI/Panitumumab 11/28/2020, irinotecan dose reduced, treatment schedule adjusted to every 3 weeks going forward Cycle 7 FOLFIRI/panitumumab 12/24/2020 Cycle 8 FOLFIRI/Panitumumab 01/16/2021--treatment held due to hypertension in the infusion area. Cycle 8 FOLFIRI/panitumumab 01/21/2021 Cycle 9 FOLFIRI/panitumumab 02/11/2021 Cycle 10 FOLFIRI/Panitumumab 03/04/2021 Cycle 11 FOLFIRI/Panitumumab 03/25/2021 CT abdomen/pelvis 04/10/2021-stable right omental and left posterior paracolic gutter nodules Cycle 12 FOLFIRI/panitumumab 04/15/2021 Cycle 13 FOLFIRI/panitumumab 05/06/2021 Cycle 14 FOLFIRI/Panitumumab 05/27/2021 Cycle 15 FOLFIRI/Panitumumab 06/17/2021 CT abdomen/pelvis 07/05/2021-slight enlargement of a dominant right omental implant, no new implants Patient requested a treatment break CT abdomen/pelvis 10/04/2021-mild increase in size of multiple omental soft tissue nodules, 4 mm calculus in the distal left ureter adjacent to the  ureteral stent Cycle 1 Lonsurf 10/14/2021 10/28/2021 Avastin every 2 weeks Cycle 2 Lonsurf 11/11/2021 Cycle 3 Lonsurf 12/09/2021 Cycle 4 Lonsurf 01/06/2022 Avastin held 01/20/2022 due to proteinuria, 24-hour urine 43 mg CT/pelvis 01/30/2022-possible new peritoneal implant at the transverse colon, no significant change in other omental/peritoneal implants, stable chronic rectal wall thickening, status post removal of double-J left ureteral stent with no evidence of hydronephrosis or ureteral calculus, nonobstructing bilateral renal calculi Cycle 5 Lonsurf 02/05/2022 or 02/06/2022 Avastin every 2 weeks Cycle 6 Lonsurf 03/03/2022 CTs 04/05/2022-increase in size and number of peritoneal implants compared to January 2024 central liver mass narrowing the anterior branch of the right portal vein, decreased biliary duct dilation, biliary stents in place, stable right cardiophrenic lymph nodes, separate pigtail stent in the lumen of the proximal duodenum Cycle 1 FOLFOX 04/14/2022 CT abdomen/pelvis 04/16/2022-stent within the third portion of the duodenum, stable peritoneal nodules, indwelling biliary stents with mild left biliary duct dilation Cycle 2 FOLFOX 04/28/2022, Emend and prophylactic dexamethasone added Cycle 3 FOLFOX 05/12/2022, Aloxi, Emend, prophylactic dexamethasone, Compazine and lorazepam as needed Chemotherapy held 05/25/2020 due to severe hypertension, evaluated in the emergency department Cycle 4 FOLFOX 06/03/2022 Cycle 5 FOLFOX 06/16/2022, oxaliplatin dose reduced and 5-FU bolus eliminated due to neutropenia CT 06/25/2022-unchanged soft tissue at the hepatic hilum, unchanged, bile duct stent, decreased size of peritoneal and omental nodules and diminished volume of likely fluid in the lower left pelvis Cycle 6 FOLFOX 06/30/2022 Cycle 7 FOLFOX 07/21/2022 CT 07/24/2022 (in the emergency department with nausea, headache, abdominal pain)-no acute pathology.  No significant interval change in omental  nodularity. Cycle 8 FOLFOX 08/04/2022, pruritus on the palms at the end of the oxaliplatin infusion, Pepcid and Benadryl given, symptoms resolved, infusion not resumed Cycle 9 FOLFOX 08/18/2022-Pepcid and Benadryl added to premedications, Oxaliplatin diluted in a larger volume and infusion time increased Cycle 10 FOLFOX 09/08/2022 Cycle 11 FOLFOX 09/22/2022 10/06/2022 patient declined treatment CTs 10/15/2022-similar soft tissue fullness in the hepatic hilum with biliary stents in place, decrease in omental/peritoneal metastases   Hypertension G4 P3, twins Kidney stones-bilateral staghorn renal calculi on CT 08/10/2020 Infected Port-A-Cath  09/12/2019-placed on Augmentin, referred for Port-A-Cath removal; Port-A-Cath removed 09/14/2019; PICC line placed 09/14/2019; culture staph aureus.  Course of Septra completed. Neutropenia secondary to chemotherapy-Udenyca added with cycle 8 FOLFIRI Right nephrostomy tube 09/12/2020; stent placed 10/09/2020, percutaneous nephrostolithotomy treatment of right-sided kidney stones 10/09/2020, malpositioned right ureter stent replacement 10/23/2020, right ureter stent removed 10/29/2020 Percutaneous left nephrostolithotomy 10/01/2021-no renal stone identified, left ureter stent left in place Cystoscopy 10/14/2021-stent removed 8.  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  Admission 04/16/2022 with nausea/vomiting and abdominal pain, felt to be acute toxicity related to chemotherapy, discharged home  04/18/2022      Disposition: Ms. Trinka has metastatic colon cancer.  She has completed 11 cycles of FOLFOX.  The restaging CT reveals no evidence of disease progression with a decrease in omental/peritoneal metastases.  She has oxaliplatin neuropathy.  We discussed continuing treatment with infusional 5-FU or changing to capecitabine maintenance.  She prefers capecitabine maintenance.  I reviewed the CT findings and images with Ms. Yamashiro.  We discussed toxicities associated with capecitabine including the chance of diarrhea and hand/foot syndrome. The plan is to begin capecitabine maintenance this week.  We will plan for a restaging CT evaluation in approximately 3 months.  We will follow the CEA.  We discussed a referral to [facility] to consider an immunotherapy trial.   Thornton Papas, MD  10/20/2022  11:28 AM

## 2022-10-20 NOTE — Telephone Encounter (Signed)
Patient offered referral to diclofenac prophylaxis hand-foot syndrome research project. Patient agreed to referral.

## 2022-10-20 NOTE — Telephone Encounter (Signed)
Clinical Pharmacist Practitioner Encounter   Received new prescription for Xeloda (capecitabine) for the maintenance treatment of metastatic colon cancer, planned duration until disease progression or unacceptable drug toxicity.  CMP from 10/20/22 assessed, no relevant lab abnormalities. Prescription dose and frequency assessed.   Current medication list in Epic reviewed, no DDIs with capecitabine identified.  Evaluated chart and no patient barriers to medication adherence identified.   Prescription has been e-scribed to the [facility] for benefits analysis and approval.  Oral Oncology Clinic will continue to follow for insurance authorization, copayment issues, initial counseling and start date.   Remi Haggard, PharmD, BCPS, BCOP, CPP Hematology/Oncology Clinical Pharmacist Practitioner [place] [phone]  10/20/2022 1:40 PM

## 2022-10-20 NOTE — Telephone Encounter (Signed)
Oral Oncology Patient Advocate Encounter  After completing a benefits investigation, prior authorization for Capecitabine is not required at this time through [employer].  Patient's copay is $0.00.     Ardeen Fillers, CPhT Oncology Pharmacy Patient Advocate  [facility]  [phone] (phone) [phone] (fax) 10/20/2022 1:30 PM

## 2022-10-20 NOTE — Telephone Encounter (Signed)
Clinical Pharmacist Practitioner Encounter   [facility] (Specialty) will deliver medication to patient on 10/22/22. She knows she can start when she had medication in hand.  Patient Education I spoke with patient for overview of new oral chemotherapy medication: Xeloda (capecitabine) for the maintenance treatment of metastatic colon cancer, planned duration until disease progression or unacceptable drug toxicity.   Counseled patient on administration, dosing, side effects, monitoring, drug-food interactions, safe handling, storage, and disposal. Patient will take 2 tablets (1,000 mg total) by mouth 2 (two) times daily after a meal.   Side effects include but not limited to: diarrhea, hand-foot syndrome, mouth sores, edema, decreased wbc, fatigue, N/V Diarrhea: patient to use loperamide as needed, she knows to call the office if she is having 4 or more loose stools per day Hand-foot syndrome: Recommended the use of Udderly Smooth Extra Care 20 Mouth sores: patient knows to request magic mouthwash if needed    Reviewed with patient importance of keeping a medication schedule and plan for any missed doses.  After discussion with patient no patient barriers to medication adherence identified.   April [name] voiced understanding and appreciation. All questions answered. Medication handout provided.  Provided patient with [facility] phone number. Patient knows to call the office with questions or concerns. Oral Chemotherapy [facility] will continue to follow.  April [name], PharmD, BCPS, BCOP, CPP Hematology/Oncology Clinical Pharmacist Practitioner [place] [phone]  10/20/2022 2:07 PM

## 2022-10-20 NOTE — Progress Notes (Signed)
Specialty Pharmacy Initial Fill Coordination Note  April [name] is a 47 y.o. female contacted today regarding refills of specialty medication(s) Capecitabine   Patient requested Delivery   Delivery date: 10/22/22   Verified address: [address] [place], [place] [address]   Medication will be filled on 10/21/22.   Patient is aware of $0.00 copayment.

## 2022-10-20 NOTE — Telephone Encounter (Signed)
Patient successfully On[employer] and drug education provided by pharmacist. Medication scheduled to be shipped on Tuesday, 10/21/22 for delivery on Wednesday, 10/22/22 from [facility] to patient's address. Patient also knows to call me at [phone] with any questions or concerns regarding receiving medication or if there is any unexpected change in co-pay.    Ardeen Fillers, CPhT Oncology Pharmacy Patient Advocate  [facility]  [phone] (phone) [phone] (fax) 10/20/2022 2:10 PM

## 2022-10-21 ENCOUNTER — Telehealth: Payer: Self-pay | Admitting: Nurse Practitioner

## 2022-10-21 ENCOUNTER — Inpatient Hospital Stay: Payer: 59

## 2022-10-21 ENCOUNTER — Other Ambulatory Visit: Payer: Self-pay

## 2022-10-21 DIAGNOSIS — C19 Malignant neoplasm of rectosigmoid junction: Secondary | ICD-10-CM

## 2022-10-21 MED ORDER — AMLODIPINE BESYLATE 10 MG PO TABS: 10.0000 mg | ORAL_TABLET | Freq: Every day | ORAL | 1 refills | Status: DC

## 2022-10-21 NOTE — Telephone Encounter (Signed)
Pt requesting amlodipine to  [id number] - [employer], [place] - [address]

## 2022-10-21 NOTE — Progress Notes (Signed)
Research Note - Consent Authorization   Study Name: Evaluation of Topical Diclofenac Use on the Incidence and Severity of Hand Foot Syndrome in Patients with Breast and Gastrointestinal Malignancies Taking Capecitabine   IRB# [id number]   A summary of the study was presented to the patient, including potential risks and benefits from study participation, alternatives treatment options available other than study participation, how their confidentiality will be maintained, and who to contact if they had questions regarding their rights as a study participant or if they experience an injury associated with their participation. They were told that participation is completely voluntary and choosing not to participate would not impact care they would otherwise have access to. They were also told that even if they choose to participate, they can withdraw their consent to participate at any time in the future. Any questions the patient asked were addressed by a member of the research team.   After having all of their questions answered, the patient agreed to participate in the study by confirming their willingness to consent to participation verbally over the phone on 10/21/2022 at 12:46 PM.    Jerry Caras, PharmD PGY2 Oncology Pharmacy Resident  Principal Investigator  [phone]

## 2022-10-23 ENCOUNTER — Inpatient Hospital Stay: Payer: 59

## 2022-10-23 ENCOUNTER — Other Ambulatory Visit: Payer: Self-pay

## 2022-10-23 DIAGNOSIS — C187 Malignant neoplasm of sigmoid colon: Secondary | ICD-10-CM | POA: Diagnosis not present

## 2022-10-23 NOTE — Patient Instructions (Signed)
PICC Removal, Adult, Care After The following information offers guidance on how to care for yourself after your procedure. Your health care provider may also give you more specific instructions. If you have problems or questions, contact your health care provider. What can I expect after the procedure? After the procedure, it is common to have: Tenderness or soreness. Redness, swelling, or a scab at the place where your PICC was removed (exit site). Follow these instructions at home: For the first 24 hours after the procedure: Keep the bandage (dressing) on your exit site clean and dry. Do not remove your dressing until your health care provider tells you to do so. Do not lift anything heavy or do activities that require great effort until your health care provider says it is okay. You should avoid: Lifting weights. Doing yard work. Doing any physical activity with repetitive arm movement. Watch closely for any signs of an air bubble in the vein (air embolism). This is a rare but serious complication. Signs of an air embolism include trouble breathing, wheezing, chest pain, or a fast pulse. If you have signs of an air embolism, call 911 right away and lie down on your left side to keep the air from moving into your lungs. After 24 hours have passed:  Remove your dressing as told by your health care provider. Wash your hands with soap and water for at least 20 seconds before and after you change your dressing. If soap and water are not available, use hand sanitizer. Return to your normal activities as told by your health care provider. A small scab may develop over the exit site. Do not pick at the scab. When bathing or showering, gently wash the exit site with soap and water. Pat it dry. Watch for signs of infection, such as: A fever or chills. Swollen glands under your arm. More redness, swelling, or soreness around your arm. Blood, fluid, or pus coming from your exit site. Warmth or a  bad smell coming from your exit site. A red streak spreading away from your exit site. General instructions Take over-the-counter and prescription medicines only as told by your health care provider. Do not take any new medicines without checking with your health care provider first. If you were given an antibiotic ointment, apply it as told by your health care provider. Keep all follow-up visits. This is important. Contact a health care provider if: You have a fever or chills. You have swelling at your exit site or swollen glands under your arm. You have signs of infection at your exit site. You have soreness, redness, or swelling in your arm that gets worse. Get help right away if: You have numbness or tingling in your fingers, hand, or arm. Your arm looks blue and feels cold. You have signs of an air embolism, such as trouble breathing, wheezing, chest pain, or a fast pulse. These symptoms may be an emergency. Get medical help right away. Call 911. Do not wait to see if the symptoms will go away. Do not drive yourself to the hospital. Summary After a PICC is removed, it is common to have tenderness or soreness, redness, swelling, or a scab at the exit site. Keep the bandage (dressing) over the exit site clean and dry. Do not remove the dressing until your health care provider tells you to do so. Do not lift anything heavy or do activities that require great effort until your health care provider says it is okay. Watch closely for any signs  of an air bubble (air embolism). If you have signs of an air embolism, call 911 right away and lie down on your left side. This information is not intended to replace advice given to you by your health care provider. Make sure you discuss any questions you have with your health care provider. Document Revised: 07/18/2020 Document Reviewed: 07/18/2020 Elsevier Patient Education  2024 [employer].

## 2022-10-23 NOTE — Progress Notes (Signed)
Patient in infusion today to have picc line dressing changed and flushed.  Patient stated she had an appointment with Dr. Truett Perna on Monday (10/20/22) and he asked if she would like to have her picc line removed due to her change in treatment.  Patient stated she had declined at that time but, after thinking about it more, would like her picc line removed.  Dr. Truett Perna made aware of patient's request.  Verbal order received to remove picc line.   PICC Removal Note: S: Patient reported to infusion room for picc line removal.  Verbal orders received from Dr. Truett Perna.  O: PICC line removed from L antecubital after sterile site prepped per protocol. PICC catheter tip visualized and intact. Pressure dressing applied with vaseline gauze and medipore tape. Patient monitored for 30 minutes post picc line removal.  A: No redness, ecchymosis, edema, swelling, or drainage noted at site. P: Instructions provided on post PICC discharge care, including followup notification instructions.

## 2022-10-27 ENCOUNTER — Encounter: Payer: Self-pay | Admitting: Oncology

## 2022-10-27 ENCOUNTER — Other Ambulatory Visit: Payer: Self-pay

## 2022-10-27 ENCOUNTER — Other Ambulatory Visit (HOSPITAL_COMMUNITY): Payer: Self-pay

## 2022-10-27 MED ORDER — DICLOFENAC SODIUM 1 % EX GEL
CUTANEOUS | 0 refills | Status: DC
Start: 2022-10-27 — End: 2022-12-18
  Filled 2022-10-27: qty 400, 48d supply, fill #0
  Filled 2022-11-18: qty 400, 30d supply, fill #0

## 2022-10-30 ENCOUNTER — Inpatient Hospital Stay: Payer: 59

## 2022-11-03 ENCOUNTER — Inpatient Hospital Stay: Payer: 59

## 2022-11-03 ENCOUNTER — Inpatient Hospital Stay: Payer: 59 | Admitting: Oncology

## 2022-11-04 ENCOUNTER — Inpatient Hospital Stay: Payer: 59

## 2022-11-06 ENCOUNTER — Inpatient Hospital Stay: Payer: 59

## 2022-11-10 ENCOUNTER — Other Ambulatory Visit: Payer: Self-pay

## 2022-11-10 ENCOUNTER — Other Ambulatory Visit (HOSPITAL_COMMUNITY): Payer: Self-pay

## 2022-11-11 ENCOUNTER — Telehealth: Payer: Self-pay | Admitting: *Deleted

## 2022-11-11 ENCOUNTER — Ambulatory Visit: Payer: 59

## 2022-11-11 DIAGNOSIS — Z0000 Encounter for general adult medical examination without abnormal findings: Secondary | ICD-10-CM

## 2022-11-11 NOTE — Progress Notes (Signed)
Subjective:   April [name] is a 47 y.o. female who presents for an Initial Medicare Annual Wellness Visit.  Visit Complete: Virtual I connected with  April [name] on 11/11/22 by a audio enabled telemedicine application and verified that I am speaking with the correct person using two identifiers.  Patient Location: Home  Provider Location: Office/Clinic  I discussed the limitations of evaluation and management by telemedicine. The patient expressed understanding and agreed to proceed.  Vital Signs: Because this visit was a virtual/telehealth visit, some criteria may be missing or patient reported. Any vitals not documented were not able to be obtained and vitals that have been documented are patient reported.    Cardiac Risk Factors include: hypertension     Objective:    Today's Vitals   11/11/22 0926  PainSc: 9    There is no height or weight on file to calculate BMI.     11/11/2022    9:41 AM 10/20/2022   11:19 AM 10/06/2022   11:06 AM 09/22/2022    9:10 AM 09/08/2022    9:38 AM 09/08/2022    9:33 AM 08/18/2022    9:19 AM  Advanced Directives  Does Patient Have a Medical Advance Directive? No No No No No No No  Would patient like information on creating a medical advance directive?   No - Patient declined No - Patient declined Yes (MAU/Ambulatory/Procedural Areas - Information given) No - Patient declined No - Patient declined    Current Medications (verified) Outpatient Encounter Medications as of 11/11/2022  Medication Sig   acetaminophen (ACETAMINOPHEN 8 HOUR) 650 MG CR tablet Take 650 mg by mouth every 8 (eight) hours as needed for pain.   amLODipine (NORVASC) 10 MG tablet Take 1 tablet (10 mg total) by mouth daily.   capecitabine (XELODA) 500 MG tablet Take 2 tablets (1,000 mg total) by mouth 2 (two) times daily after a meal.   chlorthalidone (HYGROTON) 25 MG tablet Take 1 tablet (25 mg total) by mouth daily. (Patient taking differently: Take 25 mg by  mouth once a week.)   HYDROcodone-acetaminophen (NORCO) 10-325 MG tablet Take 1-2 tablets by mouth every 6 (six) hours as needed.   potassium chloride (MICRO-K) 10 MEQ CR capsule Take 2 capsules (20 mEq total) by mouth 2 (two) times daily.   dexamethasone (DECADRON) 4 MG tablet Take 1 tablet by mouth twice a day for 3 days beginning day 2 of chemo. (Patient not taking: Reported on 11/11/2022)   diclofenac Sodium (VOLTAREN) 1 % GEL Research Patient: Apply 0.5 grams (1 fingertip) to each hand and each foot twice daily for up to 12 weeks (Patient not taking: Reported on 11/11/2022)   Polyethylene Glycol 3350 (MIRALAX PO) Take 17 g by mouth daily. (Patient not taking: Reported on 11/11/2022)   prochlorperazine (COMPAZINE) 10 MG tablet Take 1 tablet (10 mg total) by mouth every 6 (six) hours as needed for nausea or vomiting. (Patient not taking: Reported on 11/11/2022)   senna-docusate (SENOKOT-S) 8.6-50 MG tablet Take 1 tablet by mouth 2 (two) times daily. (Patient not taking: Reported on 07/25/2022)   No facility-administered encounter medications on file as of 11/11/2022.    Allergies (verified) Lisinopril, Irinotecan, Leucovorin calcium, and Oxaliplatin   History: Past Medical History:  Diagnosis Date   Benign essential hypertension 10/06/2017   Last Assessment & Plan:  Formatting of this note might be different from the original. Patient's blood pressure noted to be elevated at today's visit 188/98.  Patient will be provided with  antihypertensives in the treatment center if needed in order to meet her Avastin parameters.  Patient recommended to follow up with her primary care physician regarding medication management.  Patient is not cur   Cancer [facility])    Colon cancer (HCC)    Hypertension    Renal calculi    bilateral   Past Surgical History:  Procedure Laterality Date   BILIARY BRUSHING  03/24/2022   Procedure: BILIARY BRUSHING;  Surgeon: Meridee Score Netty Starring., MD;  Location: [facility]  ENDOSCOPY;  Service: Gastroenterology;;   BILIARY DILATION  03/24/2022   Procedure: BILIARY DILATION;  Surgeon: Lemar Lofty., MD;  Location: [facility] ENDOSCOPY;  Service: Gastroenterology;;   BILIARY STENT PLACEMENT  03/24/2022   Procedure: BILIARY STENT PLACEMENT;  Surgeon: Lemar Lofty., MD;  Location: [facility] ENDOSCOPY;  Service: Gastroenterology;;   BIOPSY  03/24/2022   Procedure: BIOPSY;  Surgeon: Lemar Lofty., MD;  Location: [facility] ENDOSCOPY;  Service: Gastroenterology;;   CESAREAN SECTION     x2   CYSTOSCOPY WITH RETROGRADE PYELOGRAM, URETEROSCOPY AND STENT PLACEMENT Right 10/22/2020   Procedure: CYSTOSCOPY WITH RETROGRADE PYELOGRAM, URETEROSCOPY AND STENT PLACEMENT;  Surgeon: Noel Christmas, MD;  Location: [facility] ORS;  Service: Urology;  Laterality: Right;   ERCP N/A 03/24/2022   Procedure: ENDOSCOPIC RETROGRADE CHOLANGIOPANCREATOGRAPHY (ERCP);  Surgeon: Lemar Lofty., MD;  Location: [facility] ENDOSCOPY;  Service: Gastroenterology;  Laterality: N/A;   IR RADIOLOGIST EVAL & MGMT  09/16/2019   IR RADIOLOGIST EVAL & MGMT  09/23/2019   IR RADIOLOGIST EVAL & MGMT  09/20/2019   IR RADIOLOGIST EVAL & MGMT  09/30/2019   IR REMOVAL TUN ACCESS W/ PORT W/O FL MOD SED  09/14/2019   IR VENO/EXT/UNI RIGHT  04/11/2022   PANCREATIC STENT PLACEMENT  03/24/2022   Procedure: PANCREATIC STENT PLACEMENT;  Surgeon: Lemar Lofty., MD;  Location: [facility] ENDOSCOPY;  Service: Gastroenterology;;   REMOVAL OF STONES  03/24/2022   Procedure: REMOVAL OF STONES;  Surgeon: Lemar Lofty., MD;  Location: [facility] ENDOSCOPY;  Service: Gastroenterology;;   Dennison Mascot  03/24/2022   Procedure: Dennison Mascot;  Surgeon: Mansouraty, Netty Starring., MD;  Location: [facility] ENDOSCOPY;  Service: Gastroenterology;;   URETERAL STENT PLACEMENT     Family History  Problem Relation Age of Onset   Hypertension Mother    Diabetes Mellitus II Father    Stroke Father    Ulcers Sister    Colon cancer Neg Hx     Esophageal cancer Neg Hx    Inflammatory bowel disease Neg Hx    Liver disease Neg Hx    Pancreatic cancer Neg Hx    Rectal cancer Neg Hx    Stomach cancer Neg Hx    Social History   Socioeconomic History   Marital status: Single    Spouse name: Not on file   Number of children: Not on file   Years of education: Not on file   Highest education level: Not on file  Occupational History   Not on file  Tobacco Use   Smoking status: Former    Types: Cigarettes   Smokeless tobacco: Never   Tobacco comments:    Quit 1 month ago   Vaping Use   Vaping status: Never Used  Substance and Sexual Activity   Alcohol use: Not Currently   Drug use: Not Currently    Types: Hydrocodone   Sexual activity: Not Currently  Other Topics Concern   Not on file  Social History Narrative   Not on file   Social  Determinants of Health   Financial Resource Strain: Low Risk  (11/11/2022)   Overall Financial Resource Strain (CARDIA)    Difficulty of Paying Living Expenses: Not hard at all  Food Insecurity: Food Insecurity Present (11/11/2022)   Hunger Vital Sign    Worried About Running Out of Food in the Last Year: Sometimes true    Ran Out of Food in the Last Year: Sometimes true  Transportation Needs: Unmet Transportation Needs (11/11/2022)   PRAPARE - Administrator, Civil Service (Medical): Yes    Lack of Transportation (Non-Medical): Yes  Physical Activity: Inactive (11/11/2022)   Exercise Vital Sign    Days of Exercise per Week: 0 days    Minutes of Exercise per Session: 0 min  Stress: No Stress Concern Present (11/11/2022)   [employer] of Occupational Health - Occupational Stress Questionnaire    Feeling of Stress : Not at all  Social Connections: Socially Isolated (11/11/2022)   Social Connection and Isolation Panel [NHANES]    Frequency of Communication with Friends and Family: More than three times a week    Frequency of Social Gatherings with Friends and Family:  More than three times a week    Attends Religious Services: Never    Database administrator or Organizations: No    Attends Engineer, structural: Never    Marital Status: Never married    Tobacco Counseling Counseling given: Not Answered Tobacco comments: Quit 1 month ago    Clinical Intake:  Pre-visit preparation completed: Yes  Pain : 0-10 Pain Score: 9  Pain Type: Acute pain Pain Location: Abdomen Pain Descriptors / Indicators: Cramping, Sharp Pain Onset: 1 to 4 weeks ago Pain Frequency: Constant     Nutritional Risks: Nausea/ vomitting/ diarrhea (nausea) Diabetes: No  How often do you need to have someone help you when you read instructions, pamphlets, or other written materials from your doctor or pharmacy?: 1 - Never  Interpreter Needed?: No  Information entered by :: NAllen LPN   Activities of Daily Living    11/11/2022    9:29 AM 07/25/2022   12:39 AM  In your present state of health, do you have any difficulty performing the following activities:  Hearing? 0 0  Vision? 0 0  Difficulty concentrating or making decisions? 0 0  Walking or climbing stairs? 0 0  Dressing or bathing? 0 0  Doing errands, shopping? 0 0  Preparing Food and eating ? N   Using the Toilet? N   In the past six months, have you accidently leaked urine? N   Do you have problems with loss of bowel control? N   Managing your Medications? N   Managing your Finances? N   Housekeeping or managing your Housekeeping? N     Patient Care Team: Early, Sung Amabile, NP as PCP - General (Nurse Practitioner) Ladene Artist, MD as Consulting Physician (Oncology)  Indicate any recent Medical Services you may have received from other than [facility] providers in the past year (date may be approximate).     Assessment:   This is a routine wellness examination for Marieanne.  Hearing/Vision screen Hearing Screening - Comments:: Denies hearing issues Vision Screening - Comments:: No regular eye  exams   Goals Addressed             This Visit's Progress    Patient Stated       11/11/2022, stay healthy       Depression Screen  11/11/2022    9:43 AM 07/18/2021    8:44 AM 04/16/2021    2:08 PM 02/12/2021    7:34 PM 02/12/2021    7:33 PM 09/09/2019   11:05 AM  PHQ 2/9 Scores  PHQ - 2 Score 0 0 2 1 1  0  PHQ- 9 Score 2  7 5       Fall Risk    11/11/2022    9:42 AM 07/18/2021    8:44 AM 02/12/2021    7:33 PM  Fall Risk   Falls in the past year? 1 0 0  Comment passed out    Number falls in past yr: 0 0 0  Injury with Fall? 0 0 0  Risk for fall due to : Medication side effect No Fall Risks No Fall Risks  Follow up Falls prevention discussed;Falls evaluation completed Education provided;Falls evaluation completed Falls evaluation completed    MEDICARE RISK AT HOME: Medicare Risk at Home Any stairs in or around the home?: Yes If so, are there any without handrails?: Yes Home free of loose throw rugs in walkways, pet beds, electrical cords, etc?: Yes Adequate lighting in your home to reduce risk of falls?: Yes Life alert?: No Use of a cane, walker or w/c?: No Grab bars in the bathroom?: No Shower chair or bench in shower?: No Elevated toilet seat or a handicapped toilet?: No  TIMED UP AND GO:  Was the test performed? No    Cognitive Function:        11/11/2022    9:46 AM  6CIT Screen  What Year? 0 points  What month? 0 points  What time? 0 points  Count back from 20 0 points  Months in reverse 0 points  Repeat phrase 0 points  Total Score 0 points    Immunizations Immunization History  Administered Date(s) Administered   PFIZER(Purple Top)SARS-COV-2 Vaccination 08/31/2019, 09/23/2019   PPD Test 05/24/2018    TDAP status: Due, Education has been provided regarding the importance of this vaccine. Advised may receive this vaccine at local pharmacy or Health Dept. Aware to provide a copy of the vaccination record if obtained from local pharmacy or  Health Dept. Verbalized acceptance and understanding.  Flu Vaccine status: Due, Education has been provided regarding the importance of this vaccine. Advised may receive this vaccine at local pharmacy or Health Dept. Aware to provide a copy of the vaccination record if obtained from local pharmacy or Health Dept. Verbalized acceptance and understanding.  Pneumococcal vaccine status: Up to date  Covid-19 vaccine status: Information provided on how to obtain vaccines.   Qualifies for Shingles Vaccine? No   Zostavax completed  n/a   Shingrix Completed?: n/a  Screening Tests Health Maintenance  Topic Date Due   DTaP/Tdap/Td (1 - Tdap) Never done   COVID-19 Vaccine (3 - Pfizer risk series) 10/21/2019   Cervical Cancer Screening (HPV/Pap Cotest)  04/29/2022   INFLUENZA VACCINE  10/20/2023 (Originally 08/14/2022)   Medicare Annual Wellness (AWV)  11/11/2023   Colonoscopy  03/23/2029   Hepatitis C Screening  Completed   HIV Screening  Completed   HPV VACCINES  Aged Out    Health Maintenance  Health Maintenance Due  Topic Date Due   DTaP/Tdap/Td (1 - Tdap) Never done   COVID-19 Vaccine (3 - Pfizer risk series) 10/21/2019   Cervical Cancer Screening (HPV/Pap Cotest)  04/29/2022    Colorectal cancer screening: No longer required.   Mammogram status: declines  Bone Density status: n/a  Lung Cancer Screening: (  Low Dose CT Chest recommended if Age 53-80 years, 20 pack-year currently smoking OR have quit w/in 15years.) does not qualify.   Lung Cancer Screening Referral: no  Additional Screening:  Hepatitis C Screening: does qualify; Completed 03/21/2022  Vision Screening: Recommended annual ophthalmology exams for early detection of glaucoma and other disorders of the eye. Is the patient up to date with their annual eye exam?  No  Who is the provider or what is the name of the office in which the patient attends annual eye exams? none If pt is not established with a provider, would  they like to be referred to a provider to establish care? No .   Dental Screening: Recommended annual dental exams for proper oral hygiene  Diabetic Foot Exam: n/a  Community Resource Referral / Chronic Care Management: CRR required this visit?  Yes   CCM required this visit?  No     Plan:     I have personally reviewed and noted the following in the patient's chart:   Medical and social history Use of alcohol, tobacco or illicit drugs  Current medications and supplements including opioid prescriptions. Patient is currently taking opioid prescriptions. Information provided to patient regarding non-opioid alternatives. Patient advised to discuss non-opioid treatment plan with their provider. Functional ability and status Nutritional status Physical activity Advanced directives List of other physicians Hospitalizations, surgeries, and ER visits in previous 12 months Vitals Screenings to include cognitive, depression, and falls Referrals and appointments  In addition, I have reviewed and discussed with patient certain preventive protocols, quality metrics, and best practice recommendations. A written personalized care plan for preventive services as well as general preventive health recommendations were provided to patient.     Barb Merino, LPN   19/14/7829   After Visit Summary: (MyChart) Due to this being a telephonic visit, the after visit summary with patients personalized plan was offered to patient via MyChart   Nurse Notes: none

## 2022-11-11 NOTE — Patient Instructions (Signed)
April [name] , Thank you for taking time to come for your Medicare Wellness Visit. I appreciate your ongoing commitment to your health goals. Please review the following plan we discussed and let me know if I can assist you in the future.   Referrals/Orders/Follow-Ups/Clinician Recommendations: none Managing Pain Without Opioids Opioids are strong medicines used to treat moderate to severe pain. For some people, especially those who have long-term (chronic) pain, opioids may not be the best choice for pain management due to: Side effects like nausea, constipation, and sleepiness. The risk of addiction (opioid use disorder). The longer you take opioids, the greater your risk of addiction. Pain that lasts for more than 3 months is called chronic pain. Managing chronic pain usually requires more than one approach and is often provided by a team of health care providers working together (multidisciplinary approach). Pain management may be done at a pain management center or pain clinic. How to manage pain without the use of opioids Use non-opioid medicines Non-opioid medicines for pain may include: Over-the-counter or prescription non-steroidal anti-inflammatory drugs (NSAIDs). These may be the first medicines used for pain. They work well for muscle and bone pain, and they reduce swelling. Acetaminophen. This over-the-counter medicine may work well for milder pain but not swelling. Antidepressants. These may be used to treat chronic pain. A certain type of antidepressant (tricyclics) is often used. These medicines are given in lower doses for pain than when used for depression. Anticonvulsants. These are usually used to treat seizures but may also reduce nerve (neuropathic) pain. Muscle relaxants. These relieve pain caused by sudden muscle tightening (spasms). You may also use a pain medicine that is applied to the skin as a patch, cream, or gel (topical analgesic), such as a numbing medicine. These  may cause fewer side effects than medicines taken by mouth. Do certain therapies as directed Some therapies can help with pain management. They include: Physical therapy. You will do exercises to gain strength and flexibility. A physical therapist may teach you exercises to move and stretch parts of your body that are weak, stiff, or painful. You can learn these exercises at physical therapy visits and practice them at home. Physical therapy may also involve: Massage. Heat wraps or applying heat or cold to affected areas. Electrical signals that interrupt pain signals (transcutaneous electrical nerve stimulation, TENS). Weak lasers that reduce pain and swelling (low-level laser therapy). Signals from your body that help you learn to regulate pain (biofeedback). Occupational therapy. This helps you to learn ways to function at home and work with less pain. Recreational therapy. This involves trying new activities or hobbies, such as a physical activity or drawing. Mental health therapy, including: Cognitive behavioral therapy (CBT). This helps you learn coping skills for dealing with pain. Acceptance and commitment therapy (ACT) to change the way you think and react to pain. Relaxation therapies, including muscle relaxation exercises and mindfulness-based stress reduction. Pain management counseling. This may be individual, family, or group counseling.  Receive medical treatments Medical treatments for pain management include: Nerve block injections. These may include a pain blocker and anti-inflammatory medicines. You may have injections: Near the spine to relieve chronic back or neck pain. Into joints to relieve back or joint pain. Into nerve areas that supply a painful area to relieve body pain. Into muscles (trigger point injections) to relieve some painful muscle conditions. A medical device placed near your spine to help block pain signals and relieve nerve pain or chronic back pain  (spinal cord  stimulation device). Acupuncture. Follow these instructions at home Medicines Take over-the-counter and prescription medicines only as told by your health care provider. If you are taking pain medicine, ask your health care providers about possible side effects to watch out for. Do not drive or use heavy machinery while taking prescription opioid pain medicine. Lifestyle  Do not use drugs or alcohol to reduce pain. If you drink alcohol, limit how much you have to: 0-1 drink a day for women who are not pregnant. 0-2 drinks a day for men. Know how much alcohol is in a drink. In the [place] one drink equals one 12 oz bottle of beer (355 mL), one 5 oz glass of wine (148 mL), or one 1 oz glass of hard liquor (44 mL). Do not use any products that contain nicotine or tobacco. These products include cigarettes, chewing tobacco, and vaping devices, such as e-cigarettes. If you need help quitting, ask your health care provider. Eat a healthy diet and maintain a healthy weight. Poor diet and excess weight may make pain worse. Eat foods that are high in fiber. These include fresh fruits and vegetables, whole grains, and beans. Limit foods that are high in fat and processed sugars, such as fried and sweet foods. Exercise regularly. Exercise lowers stress and may help relieve pain. Ask your health care provider what activities and exercises are safe for you. If your health care provider approves, join an exercise class that combines movement and stress reduction. Examples include yoga and tai chi. Get enough sleep. Lack of sleep may make pain worse. Lower stress as much as possible. Practice stress reduction techniques as told by your therapist. General instructions Work with all your pain management providers to find the treatments that work best for you. You are an important member of your pain management team. There are many things you can do to reduce pain on your own. Consider joining an  online or in-person support group for people who have chronic pain. Keep all follow-up visits. This is important. Where to find more information You can find more information about managing pain without opioids from: American Academy of Pain Medicine: painmed.org Institute for Chronic Pain: instituteforchronicpain.org American Chronic Pain Association: theacpa.org Contact a health care provider if: You have side effects from pain medicine. Your pain gets worse or does not get better with treatments or home therapy. You are struggling with anxiety or depression. Summary Many types of pain can be managed without opioids. Chronic pain may respond better to pain management without opioids. Pain is best managed when you and a team of health care providers work together. Pain management without opioids may include non-opioid medicines, medical treatments, physical therapy, mental health therapy, and lifestyle changes. Tell your health care providers if your pain gets worse or is not being managed well enough. This information is not intended to replace advice given to you by your health care provider. Make sure you discuss any questions you have with your health care provider. Document Revised: 04/11/2020 Document Reviewed: 04/11/2020 Elsevier Patient Education  2024 Elsevier Inc.   This is a list of the screening recommended for you and due dates:  Health Maintenance  Topic Date Due   DTaP/Tdap/Td vaccine (1 - Tdap) Never done   COVID-19 Vaccine (3 - Pfizer risk series) 10/21/2019   Pap with HPV screening  04/29/2022   Flu Shot  10/20/2023*   Medicare Annual Wellness Visit  11/11/2023   Colon Cancer Screening  03/23/2029   Hepatitis C Screening  Completed   HIV Screening  Completed   HPV Vaccine  Aged Out  *Topic was postponed. The date shown is not the original due date.    Advanced directives: (ACP Link)Information on Advanced Care Planning can be found at [facility]  of [facility] Advance Health Care Directives Advance Health Care Directives ([email or web address]   Next Medicare Annual Wellness Visit scheduled for next year: Yes  insert Preventive Care Attachment Reference

## 2022-11-11 NOTE — Progress Notes (Signed)
Care Coordination  Outreach Note  11/11/2022 Name: April [name] MRN: [id number] DOB: 03-Dec-1975   Care Coordination Outreach Attempts: An unsuccessful telephone outreach was attempted today to offer the patient information about available care coordination services.  Follow Up Plan:  Additional outreach attempts will be made to offer the patient care coordination information and services.   Encounter Outcome:  No Answer  Gwenevere Ghazi  Care Coordination Care Guide  Direct Dial: [phone]

## 2022-11-13 ENCOUNTER — Encounter: Payer: Self-pay | Admitting: Nurse Practitioner

## 2022-11-13 ENCOUNTER — Inpatient Hospital Stay: Payer: 59

## 2022-11-13 ENCOUNTER — Other Ambulatory Visit: Payer: Self-pay

## 2022-11-13 ENCOUNTER — Inpatient Hospital Stay: Payer: 59 | Admitting: Nurse Practitioner

## 2022-11-13 VITALS — BP 142/79 | HR 100 | Temp 98.2°F | Resp 18 | Ht 64.0 in | Wt 150.3 lb

## 2022-11-13 DIAGNOSIS — C187 Malignant neoplasm of sigmoid colon: Secondary | ICD-10-CM | POA: Diagnosis not present

## 2022-11-13 DIAGNOSIS — C19 Malignant neoplasm of rectosigmoid junction: Secondary | ICD-10-CM

## 2022-11-13 LAB — CEA (ACCESS): CEA (CHCC): 3.47 ng/mL (ref 0.00–5.00)

## 2022-11-13 LAB — CBC WITH DIFFERENTIAL (CANCER CENTER ONLY)
Abs Immature Granulocytes: 0.01 10*3/uL (ref 0.00–0.07)
Basophils Absolute: 0.1 10*3/uL (ref 0.0–0.1)
Basophils Relative: 1 %
Eosinophils Absolute: 1.4 10*3/uL — ABNORMAL HIGH (ref 0.0–0.5)
Eosinophils Relative: 19 %
HCT: 40.2 % (ref 36.0–46.0)
Hemoglobin: 13.3 g/dL (ref 12.0–15.0)
Immature Granulocytes: 0 %
Lymphocytes Relative: 35 %
Lymphs Abs: 2.5 10*3/uL (ref 0.7–4.0)
MCH: 31.1 pg (ref 26.0–34.0)
MCHC: 33.1 g/dL (ref 30.0–36.0)
MCV: 94.1 fL (ref 80.0–100.0)
Monocytes Absolute: 0.6 10*3/uL (ref 0.1–1.0)
Monocytes Relative: 9 %
Neutro Abs: 2.6 10*3/uL (ref 1.7–7.7)
Neutrophils Relative %: 36 %
Platelet Count: 214 10*3/uL (ref 150–400)
RBC: 4.27 MIL/uL (ref 3.87–5.11)
RDW: 14.3 % (ref 11.5–15.5)
WBC Count: 7.2 10*3/uL (ref 4.0–10.5)
nRBC: 0 % (ref 0.0–0.2)

## 2022-11-13 LAB — CMP (CANCER CENTER ONLY)
ALT: 11 U/L (ref 0–44)
AST: 18 U/L (ref 15–41)
Albumin: 4 g/dL (ref 3.5–5.0)
Alkaline Phosphatase: 142 U/L — ABNORMAL HIGH (ref 38–126)
Anion gap: 6 (ref 5–15)
BUN: 13 mg/dL (ref 6–20)
CO2: 36 mmol/L — ABNORMAL HIGH (ref 22–32)
Calcium: 9.9 mg/dL (ref 8.9–10.3)
Chloride: 98 mmol/L (ref 98–111)
Creatinine: 0.9 mg/dL (ref 0.44–1.00)
GFR, Estimated: >60 mL/min (ref >60)
Glucose, Bld: 76 mg/dL (ref 70–99)
Potassium: 3.2 mmol/L — ABNORMAL LOW (ref 3.5–5.1)
Sodium: 140 mmol/L (ref 135–145)
Total Bilirubin: 0.2 mg/dL — ABNORMAL LOW (ref 0.3–1.2)
Total Protein: 7.8 g/dL (ref 6.5–8.1)

## 2022-11-13 NOTE — Progress Notes (Signed)
[facility] OFFICE PROGRESS NOTE   Diagnosis: Colon cancer  INTERVAL HISTORY:   April [name] returns as scheduled.  She began Xeloda maintenance 10/26/2022.  She has intermittent nausea.  The nausea does not occur on a daily basis.  No diarrhea.  She estimates 2 bowel movements a week but feels like she needs to go more.  She strains while on the toilet.  This is causing rectal discomfort. She takes MiraLAX.  She has other laxatives but has not tried them yet.  No mouth sores.  No hand or foot pain or redness.  Persistent numbness in the feet.  Objective:  Vital signs in last 24 hours:  Blood pressure (!) 142/79, pulse 100, temperature 98.2 F (36.8 C), temperature source Temporal, resp. rate 18, height 5\' 4"  (1.626 m), weight 150 lb 4.8 oz (68.2 kg), last menstrual period 01/07/2006, SpO2 100%.    HEENT: No thrush or ulcers. Resp: Lungs clear bilaterally. Cardio: Regular rate and rhythm. GI: Abdomen soft and nontender.  No hepatosplenomegaly. Vascular: No leg edema. Skin: Palms without erythema.  Soles are dry appearing.   Lab Results:  Lab Results  Component Value Date   WBC 7.2 11/13/2022   HGB 13.3 11/13/2022   HCT 40.2 11/13/2022   MCV 94.1 11/13/2022   PLT 214 11/13/2022   NEUTROABS 2.6 11/13/2022    Imaging:  No results found.  Medications: I have reviewed the patient's current medications.  Assessment/Plan: Sigmoid colon cancer, stage IV (pT4a,pN2b,M1c) Colonoscopy 03/24/2019-3 rectal polyps-hyperplastic polyps, distal colon biopsy-at least intramucosal adenocarcinoma, completely obstructing mass in the distal sigmoid colon, could not be traversed, intact mismatch repair protein expression 03/24/2019-CEA 56.1 03/29/2019 CT abdomen/pelvis-circumferential thickening involving the entire mid and distal sigmoid colon to the level of the rectosigmoid junction, solid/cystic mass in the left ovary, bilateral nephrolithiasis Robotic assisted low  anterior resection, mesenteric lymphadenectomy, bilateral salpingo-oophorectomy 04/08/2019 Pathology ([facility] review of outside pathology) metastatic adenocarcinoma involving the peritoneum overlying the round ligament, serosa of the urinary bladder, serosa of the right ureter a sacral area, pelvic peritoneum, and left ovary.  Omental biopsy with focal mucin pools with no carcinoma cells identified, right hemidiaphragm biopsy involved by metastatic adenocarcinoma, invasive adenocarcinoma the sigmoid colon, moderately differentiated, T4a, perineural and vascular invasion present, 7/22 lymph nodes, multiple tumor deposits, resection margins negative Negative for PD-L1, low probability of MSI-high, HER-2 negative, negative for BRAF, NRAS and KRAS alterations CTs 05/19/2019-no evidence of metastatic disease, findings suspicious for colitis of the transverse and ascending colon, small amount of ascites in the cul-de-sac, bilateral renal calculi Cycle 1 FOLFIRI 05/24/2019, bevacizumab added with cycle 2 Cycle 4 FOLFIRI/bevacizumab 07/05/2019 Cycle 5 FOLFIRI 07/27/2019, bevacizumab held secondary to hypertension Cycle 6 FOLFIRI 08/10/1999, bevacizumab held secondary to hypertension CTs 08/30/2019-no evidence of recurrent disease, new subsolid right lower lobe nodule felt to be inflammatory, emphysema Cycle 7 FOLFIRI 09/26/2019, bevacizumab remains on hold secondary to hypertension Cycle 8 FOLFIRI 10/17/2019, bevacizumab held secondary to hypertension, Udenyca added for neutropenia Cycle 9 FOLFIRI 11/07/2019, bevacizumab held secondary to hypertension, Udenyca Cycle 10 FOLFIRI 11/28/2019, bevacizumab held, Udenyca Cycle 11 FOLFIRI 12/26/2019, bevacizumab held, Udenyca CTs 01/25/2020-no evidence of recurrent disease, resolution of right lower lobe nodule Maintenance Xeloda beginning 02/06/2020 CTs 04/25/2020- no evidence of metastatic disease, multiple bilateral renal calculi without hydronephrosis Maintenance Xeloda  continued CT abdomen/pelvis without contrast 08/10/2020-bilateral staghorn renal calculi, no evidence of metastatic disease; addendum 08/23/2020-small but increasing omental nodules. Cycle 1 FOLFIRI/panitumumab 09/19/2020 Cycle 2 FOLFIRI/Panitumumab 10/03/2020 Cycle 3 FOLFIRI/Panitumumab 10/17/2020  Cycle 4 FOLFIRI/panitumumab 10/31/2020 Cycle 5 FOLFIRI/Panitumumab 11/14/2020 CTs 11/26/2020-stable omental metastases compared to 10/22/2020, mildly decreased from 08/10/2020.  Left lower quadrant tiny paracolic gutter implant slightly decreased from CT 08/10/2020.  No new or progressive metastatic disease in the abdomen or pelvis. Cycle 6 FOLFIRI/Panitumumab 11/28/2020, irinotecan dose reduced, treatment schedule adjusted to every 3 weeks going forward Cycle 7 FOLFIRI/panitumumab 12/24/2020 Cycle 8 FOLFIRI/Panitumumab 01/16/2021--treatment held due to hypertension in the infusion area. Cycle 8 FOLFIRI/panitumumab 01/21/2021 Cycle 9 FOLFIRI/panitumumab 02/11/2021 Cycle 10 FOLFIRI/Panitumumab 03/04/2021 Cycle 11 FOLFIRI/Panitumumab 03/25/2021 CT abdomen/pelvis 04/10/2021-stable right omental and left posterior paracolic gutter nodules Cycle 12 FOLFIRI/panitumumab 04/15/2021 Cycle 13 FOLFIRI/panitumumab 05/06/2021 Cycle 14 FOLFIRI/Panitumumab 05/27/2021 Cycle 15 FOLFIRI/Panitumumab 06/17/2021 CT abdomen/pelvis 07/05/2021-slight enlargement of a dominant right omental implant, no new implants Patient requested a treatment break CT abdomen/pelvis 10/04/2021-mild increase in size of multiple omental soft tissue nodules, 4 mm calculus in the distal left ureter adjacent to the ureteral stent Cycle 1 Lonsurf 10/14/2021 10/28/2021 Avastin every 2 weeks Cycle 2 Lonsurf 11/11/2021 Cycle 3 Lonsurf 12/09/2021 Cycle 4 Lonsurf 01/06/2022 Avastin held 01/20/2022 due to proteinuria, 24-hour urine 43 mg CT/pelvis 01/30/2022-possible new peritoneal implant at the transverse colon, no significant change in other omental/peritoneal implants,  stable chronic rectal wall thickening, status post removal of double-J left ureteral stent with no evidence of hydronephrosis or ureteral calculus, nonobstructing bilateral renal calculi Cycle 5 Lonsurf 02/05/2022 or 02/06/2022 Avastin every 2 weeks Cycle 6 Lonsurf 03/03/2022 CTs 04/05/2022-increase in size and number of peritoneal implants compared to January 2024 central liver mass narrowing the anterior branch of the right portal vein, decreased biliary duct dilation, biliary stents in place, stable right cardiophrenic lymph nodes, separate pigtail stent in the lumen of the proximal duodenum Cycle 1 FOLFOX 04/14/2022 CT abdomen/pelvis 04/16/2022-stent within the third portion of the duodenum, stable peritoneal nodules, indwelling biliary stents with mild left biliary duct dilation Cycle 2 FOLFOX 04/28/2022, Emend and prophylactic dexamethasone added Cycle 3 FOLFOX 05/12/2022, Aloxi, Emend, prophylactic dexamethasone, Compazine and lorazepam as needed Chemotherapy held 05/25/2020 due to severe hypertension, evaluated in the emergency department Cycle 4 FOLFOX 06/03/2022 Cycle 5 FOLFOX 06/16/2022, oxaliplatin dose reduced and 5-FU bolus eliminated due to neutropenia CT 06/25/2022-unchanged soft tissue at the hepatic hilum, unchanged, bile duct stent, decreased size of peritoneal and omental nodules and diminished volume of likely fluid in the lower left pelvis Cycle 6 FOLFOX 06/30/2022 Cycle 7 FOLFOX 07/21/2022 CT 07/24/2022 (in the emergency department with nausea, headache, abdominal pain)-no acute pathology.  No significant interval change in omental nodularity. Cycle 8 FOLFOX 08/04/2022, pruritus on the palms at the end of the oxaliplatin infusion, Pepcid and Benadryl given, symptoms resolved, infusion not resumed Cycle 9 FOLFOX 08/18/2022-Pepcid and Benadryl added to premedications, Oxaliplatin diluted in a larger volume and infusion time increased Cycle 10 FOLFOX 09/08/2022 Cycle 11 FOLFOX 09/22/2022 10/06/2022  patient declined treatment CTs 10/15/2022-similar soft tissue fullness in the hepatic hilum with biliary stents in place, decrease in omental/peritoneal metastases Maintenance capecitabine 10/26/2022   Hypertension G4 P3, twins Kidney stones-bilateral staghorn renal calculi on CT 08/10/2020 Infected Port-A-Cath 09/12/2019-placed on Augmentin, referred for Port-A-Cath removal; Port-A-Cath removed 09/14/2019; PICC line placed 09/14/2019; culture staph aureus.  Course of Septra completed. Neutropenia secondary to chemotherapy-Udenyca added with cycle 8 FOLFIRI Right nephrostomy tube 09/12/2020; stent placed 10/09/2020, percutaneous nephrostolithotomy treatment of right-sided kidney stones 10/09/2020, malpositioned right ureter stent replacement 10/23/2020, right ureter stent removed 10/29/2020 Percutaneous left nephrostolithotomy 10/01/2021-no renal stone identified, left ureter stent left in place Cystoscopy 10/14/2021-stent removed  8.  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  Admission 04/16/2022 with nausea/vomiting and abdominal pain, felt to be acute toxicity related to chemotherapy, discharged home 04/18/2022  Disposition: April [name] appears stable.  She began maintenance capecitabine 10/26/2022.  She does not seem to be having significant side effects.  Plan to continue the same.  She is experiencing constipation.  She will continue MiraLAX and begin sorbitol.  CBC and chemistry panel reviewed.  Labs adequate to  continue capecitabine as above.  She has mild hypokalemia.  She is taking Micro-K once daily.  She will increase to twice daily.  She will return for lab and follow-up in 3 weeks.  She will contact the office in the interim with any problems.    Lonna Cobb ANP/GNP-BC   11/13/2022  2:32 PM

## 2022-11-17 NOTE — Progress Notes (Signed)
  Care Coordination   Note   11/17/2022 Name: April [name] MRN: [id number] DOB: 11/14/1975  April [name] is a 47 y.o. year old female who sees Early, Sung Amabile, NP for primary care. I reached out to [name] by phone today to offer care coordination services.  April [name] was given information about Care Coordination services today including:   The Care Coordination services include support from the care team which includes your Nurse Coordinator, Clinical Social Worker, or Pharmacist.  The Care Coordination team is here to help remove barriers to the health concerns and goals most important to you. Care Coordination services are voluntary, and the patient may decline or stop services at any time by request to their care team member.   Care Coordination Consent Status: Patient agreed to services and verbal consent obtained.   Follow up plan:  Telephone appointment with care coordination team member scheduled for:  11/27/22  Encounter Outcome:  Patient Scheduled  [facility] Coordination Care Guide  Direct Dial: [phone]

## 2022-11-18 ENCOUNTER — Other Ambulatory Visit (HOSPITAL_COMMUNITY): Payer: Self-pay

## 2022-11-18 ENCOUNTER — Telehealth: Payer: Self-pay | Admitting: *Deleted

## 2022-11-18 ENCOUNTER — Other Ambulatory Visit: Payer: Self-pay | Admitting: Oncology

## 2022-11-18 ENCOUNTER — Other Ambulatory Visit: Payer: Self-pay

## 2022-11-18 DIAGNOSIS — C19 Malignant neoplasm of rectosigmoid junction: Secondary | ICD-10-CM

## 2022-11-18 MED ORDER — CAPECITABINE 500 MG PO TABS
1000.0000 mg | ORAL_TABLET | Freq: Two times a day (BID) | ORAL | 0 refills | Status: DC
Start: 2022-11-18 — End: 2022-11-24
  Filled 2022-11-18: qty 120, 30d supply, fill #0

## 2022-11-18 NOTE — Telephone Encounter (Addendum)
Called to report request to be seen by MD or NP. Reports abdominal cramping is getting worse with very hyperactive bowel sounds. Pressure in her abdomen. Still having minimal stool output and BM requires a lot of straining.  She is taking Miralax daily. Has not resumed the sorbitol. Strongly encouraged her to take both as directed every day. Tells RN that every time she takes a dose of Xeloda she has more abdominal pain. Needs refill on her hydrocodone as well. Per NP: Take Miralax and Sorbitol as directed. Can be seen today w/abd xray. Patient does not have transportation for today. She will come tomorrow at 2 pm for xray and see NP at 2:45 pm. Pain med will be refilled at visit tomorrow.

## 2022-11-18 NOTE — Progress Notes (Signed)
Specialty Pharmacy Ongoing Clinical Assessment Note  April [name] is a 47 y.o. female who is being followed by the specialty pharmacy service for RxSp Oncology   Patient's specialty medication(s) reviewed today: Capecitabine   Missed doses in the last 4 weeks: 0   Patient/Caregiver did not have any additional questions or concerns.   Therapeutic benefit summary: Patient is achieving benefit   Adverse events/side effects summary: Experienced adverse events/side effects (April [name] is having constipation and flatulence that is bothersome, see intervention notes)   Patient's therapy is appropriate to: Continue    Goals Addressed             This Visit's Progress    Slow Disease Progression       Patient is on track. Patient will maintain adherence.  She remains stable at this time.         Follow up:  3 months  Servando Snare Specialty Pharmacist   Clinical Intervention Note  Clinical Intervention Notes: April [name] is having constipation and flatulence that is bothersome, she dose report having the urge to go, but being unable to go.  We discussed adding a stool softener at the lowest dose and see if that helps and increasing her fluid intake.  She was understanding.  She is also seeing her provider tomorrow and plans to discuss this with them at that visit.   Clinical Intervention Outcomes: Prevention of an adverse drug event   Eulah Citizen

## 2022-11-18 NOTE — Progress Notes (Signed)
Specialty Pharmacy Refill Coordination Note  Jennet Scroggin is a 47 y.o. female contacted today regarding refills of specialty medication(s) Capecitabine   Patient requested Delivery   Delivery date: 11/21/22   Verified address: [address] [place] [address]   Medication will be filled on 11/20/22.

## 2022-11-19 ENCOUNTER — Other Ambulatory Visit: Payer: Self-pay

## 2022-11-19 ENCOUNTER — Inpatient Hospital Stay: Payer: 59 | Attending: Oncology | Admitting: Nurse Practitioner

## 2022-11-19 ENCOUNTER — Inpatient Hospital Stay (HOSPITAL_BASED_OUTPATIENT_CLINIC_OR_DEPARTMENT_OTHER)
Admission: EM | Admit: 2022-11-19 | Discharge: 2022-11-24 | DRG: 948 | Disposition: A | Discharge status: Home / Self Care | Payer: 59 | Source: Ambulatory Visit | Attending: Internal Medicine | Admitting: Internal Medicine

## 2022-11-19 ENCOUNTER — Encounter (HOSPITAL_COMMUNITY): Payer: Self-pay | Admitting: Internal Medicine

## 2022-11-19 ENCOUNTER — Emergency Department (HOSPITAL_BASED_OUTPATIENT_CLINIC_OR_DEPARTMENT_OTHER): Payer: 59

## 2022-11-19 ENCOUNTER — Ambulatory Visit (HOSPITAL_BASED_OUTPATIENT_CLINIC_OR_DEPARTMENT_OTHER)
Admission: RE | Admit: 2022-11-19 | Discharge: 2022-11-19 | Disposition: A | Discharge status: Home / Self Care | Payer: 59 | Source: Ambulatory Visit | Attending: Nurse Practitioner | Admitting: Nurse Practitioner

## 2022-11-19 ENCOUNTER — Encounter: Payer: Self-pay | Admitting: Nurse Practitioner

## 2022-11-19 VITALS — BP 142/89 | HR 91 | Temp 98.2°F | Resp 18 | Ht 64.0 in | Wt 143.0 lb

## 2022-11-19 DIAGNOSIS — R0602 Shortness of breath: Secondary | ICD-10-CM | POA: Diagnosis not present

## 2022-11-19 DIAGNOSIS — C187 Malignant neoplasm of sigmoid colon: Secondary | ICD-10-CM | POA: Insufficient documentation

## 2022-11-19 DIAGNOSIS — K5730 Diverticulosis of large intestine without perforation or abscess without bleeding: Secondary | ICD-10-CM | POA: Diagnosis not present

## 2022-11-19 DIAGNOSIS — R000 Tachycardia, unspecified: Secondary | ICD-10-CM | POA: Diagnosis present

## 2022-11-19 DIAGNOSIS — K5900 Constipation, unspecified: Secondary | ICD-10-CM | POA: Insufficient documentation

## 2022-11-19 DIAGNOSIS — E876 Hypokalemia: Secondary | ICD-10-CM | POA: Diagnosis not present

## 2022-11-19 DIAGNOSIS — G893 Neoplasm related pain (acute) (chronic): Principal | ICD-10-CM | POA: Diagnosis present

## 2022-11-19 DIAGNOSIS — Z888 Allergy status to other drugs, medicaments and biological substances status: Secondary | ICD-10-CM | POA: Diagnosis not present

## 2022-11-19 DIAGNOSIS — R0789 Other chest pain: Secondary | ICD-10-CM | POA: Diagnosis not present

## 2022-11-19 DIAGNOSIS — K831 Obstruction of bile duct: Secondary | ICD-10-CM | POA: Diagnosis present

## 2022-11-19 DIAGNOSIS — T451X5A Adverse effect of antineoplastic and immunosuppressive drugs, initial encounter: Secondary | ICD-10-CM | POA: Diagnosis not present

## 2022-11-19 DIAGNOSIS — D701 Agranulocytosis secondary to cancer chemotherapy: Secondary | ICD-10-CM | POA: Diagnosis present

## 2022-11-19 DIAGNOSIS — Z79899 Other long term (current) drug therapy: Secondary | ICD-10-CM

## 2022-11-19 DIAGNOSIS — C786 Secondary malignant neoplasm of retroperitoneum and peritoneum: Secondary | ICD-10-CM | POA: Diagnosis not present

## 2022-11-19 DIAGNOSIS — C19 Malignant neoplasm of rectosigmoid junction: Secondary | ICD-10-CM

## 2022-11-19 DIAGNOSIS — Z823 Family history of stroke: Secondary | ICD-10-CM

## 2022-11-19 DIAGNOSIS — Z833 Family history of diabetes mellitus: Secondary | ICD-10-CM | POA: Diagnosis not present

## 2022-11-19 DIAGNOSIS — C785 Secondary malignant neoplasm of large intestine and rectum: Secondary | ICD-10-CM | POA: Diagnosis not present

## 2022-11-19 DIAGNOSIS — R935 Abnormal findings on diagnostic imaging of other abdominal regions, including retroperitoneum: Secondary | ICD-10-CM

## 2022-11-19 DIAGNOSIS — M533 Sacrococcygeal disorders, not elsewhere classified: Secondary | ICD-10-CM | POA: Diagnosis not present

## 2022-11-19 DIAGNOSIS — I10 Essential (primary) hypertension: Secondary | ICD-10-CM | POA: Diagnosis not present

## 2022-11-19 DIAGNOSIS — Z8249 Family history of ischemic heart disease and other diseases of the circulatory system: Secondary | ICD-10-CM | POA: Diagnosis not present

## 2022-11-19 DIAGNOSIS — R197 Diarrhea, unspecified: Secondary | ICD-10-CM | POA: Insufficient documentation

## 2022-11-19 DIAGNOSIS — Z87891 Personal history of nicotine dependence: Secondary | ICD-10-CM | POA: Diagnosis not present

## 2022-11-19 DIAGNOSIS — Z87442 Personal history of urinary calculi: Secondary | ICD-10-CM | POA: Insufficient documentation

## 2022-11-19 DIAGNOSIS — D128 Benign neoplasm of rectum: Secondary | ICD-10-CM | POA: Diagnosis not present

## 2022-11-19 DIAGNOSIS — R1110 Vomiting, unspecified: Secondary | ICD-10-CM | POA: Insufficient documentation

## 2022-11-19 DIAGNOSIS — K621 Rectal polyp: Secondary | ICD-10-CM

## 2022-11-19 DIAGNOSIS — R109 Unspecified abdominal pain: Secondary | ICD-10-CM | POA: Insufficient documentation

## 2022-11-19 DIAGNOSIS — C7951 Secondary malignant neoplasm of bone: Secondary | ICD-10-CM | POA: Insufficient documentation

## 2022-11-19 LAB — CBC WITH DIFFERENTIAL/PLATELET
Abs Immature Granulocytes: 0.02 10*3/uL (ref 0.00–0.07)
Basophils Absolute: 0.1 10*3/uL (ref 0.0–0.1)
Basophils Relative: 1 %
Eosinophils Absolute: 0.6 10*3/uL — ABNORMAL HIGH (ref 0.0–0.5)
Eosinophils Relative: 8 %
HCT: 43 % (ref 36.0–46.0)
Hemoglobin: 14.5 g/dL (ref 12.0–15.0)
Immature Granulocytes: 0 %
Lymphocytes Relative: 30 %
Lymphs Abs: 2.2 10*3/uL (ref 0.7–4.0)
MCH: 30.9 pg (ref 26.0–34.0)
MCHC: 33.7 g/dL (ref 30.0–36.0)
MCV: 91.7 fL (ref 80.0–100.0)
Monocytes Absolute: 0.4 10*3/uL (ref 0.1–1.0)
Monocytes Relative: 6 %
Neutro Abs: 4.2 10*3/uL (ref 1.7–7.7)
Neutrophils Relative %: 55 %
Platelets: 285 10*3/uL (ref 150–400)
RBC: 4.69 MIL/uL (ref 3.87–5.11)
RDW: 14 % (ref 11.5–15.5)
WBC: 7.5 10*3/uL (ref 4.0–10.5)
nRBC: 0 % (ref 0.0–0.2)

## 2022-11-19 LAB — COMPREHENSIVE METABOLIC PANEL
ALT: 13 U/L (ref 0–44)
AST: 22 U/L (ref 15–41)
Albumin: 4.4 g/dL (ref 3.5–5.0)
Alkaline Phosphatase: 159 U/L — ABNORMAL HIGH (ref 38–126)
Anion gap: 13 (ref 5–15)
BUN: 7 mg/dL (ref 6–20)
CO2: 28 mmol/L (ref 22–32)
Calcium: 10.6 mg/dL — ABNORMAL HIGH (ref 8.9–10.3)
Chloride: 98 mmol/L (ref 98–111)
Creatinine, Ser: 0.57 mg/dL (ref 0.44–1.00)
GFR, Estimated: >60 mL/min (ref >60)
Glucose, Bld: 86 mg/dL (ref 70–99)
Potassium: 3.1 mmol/L — ABNORMAL LOW (ref 3.5–5.1)
Sodium: 139 mmol/L (ref 135–145)
Total Bilirubin: 0.4 mg/dL (ref <1.2)
Total Protein: 8.6 g/dL — ABNORMAL HIGH (ref 6.5–8.1)

## 2022-11-19 LAB — LIPASE, BLOOD: Lipase: 37 U/L (ref 11–51)

## 2022-11-19 MED ORDER — HYDROMORPHONE HCL 1 MG/ML IJ SOLN
1.0000 mg | Freq: Once | INTRAMUSCULAR | Status: AC
  Administered 2022-11-19: 1 mg via INTRAVENOUS
  Filled 2022-11-19: qty 1

## 2022-11-19 MED ORDER — POTASSIUM CHLORIDE 10 MEQ/100ML IV SOLN
10.0000 meq | Freq: Once | INTRAVENOUS | Status: AC
  Administered 2022-11-19: 10 meq via INTRAVENOUS
  Filled 2022-11-19: qty 100

## 2022-11-19 MED ORDER — SODIUM CHLORIDE 0.9 % IV BOLUS
500.0000 mL | Freq: Once | INTRAVENOUS | Status: AC
  Administered 2022-11-19: 500 mL via INTRAVENOUS

## 2022-11-19 MED ORDER — AMLODIPINE BESYLATE 5 MG PO TABS
10.0000 mg | ORAL_TABLET | Freq: Once | ORAL | Status: AC
Start: 2022-11-19 — End: 2022-11-19
  Administered 2022-11-19: 10 mg via ORAL
  Filled 2022-11-19: qty 2

## 2022-11-19 MED ORDER — IOHEXOL 300 MG/ML  SOLN
100.0000 mL | Freq: Once | INTRAMUSCULAR | Status: AC | PRN
  Administered 2022-11-19: 80 mL via INTRAVENOUS

## 2022-11-19 MED ORDER — ONDANSETRON HCL 4 MG/2ML IJ SOLN
4.0000 mg | Freq: Once | INTRAMUSCULAR | Status: AC
  Administered 2022-11-19: 4 mg via INTRAVENOUS
  Filled 2022-11-19: qty 2

## 2022-11-19 NOTE — Progress Notes (Signed)
[facility] OFFICE PROGRESS NOTE   Diagnosis: Colon cancer  INTERVAL HISTORY:   [name] returns prior to scheduled follow-up.  She began maintenance capecitabine 10/26/2022.  Most recent office visit 11/13/2022 she seemed to be tolerating capecitabine fairly well.  She complained of constipation, was instructed to continue MiraLAX and begin sorbitol.  She contacted the office yesterday to report abdominal cramps and constipation.  She is taking MiraLAX daily.  She had not resumed sorbitol as we had discussed at her last office visit.  She reports having a liquid bowel movement this morning.  She had another small amount of liquid stool when she got to the office.  She had an episode of nausea/vomiting last night.  She has crampy abdominal pain.  She also feels pain at the rectum when she strains.  She is not eating due to the abdominal pain.  She has periodic sweats.  No documented fever.  She noted a small amount of blood on the toilet tissue this morning.    Objective:  Vital signs in last 24 hours:  Blood pressure (!) 142/89, pulse 91, temperature 98.2 F (36.8 C), temperature source Temporal, resp. rate 18, height 5\' 4"  (1.626 m), weight 143 lb (64.9 kg), last menstrual period 01/07/2006, SpO2 100%.    Resp: Lungs clear bilaterally. Cardio: Regular rate and rhythm. GI: Abdomen is soft, mild generalized tenderness.  Bowel sounds present, hypoactive.  No hepatosplenomegaly. Vascular: No leg edema.   Lab Results:  Lab Results  Component Value Date   WBC 7.2 11/13/2022   HGB 13.3 11/13/2022   HCT 40.2 11/13/2022   MCV 94.1 11/13/2022   PLT 214 11/13/2022   NEUTROABS 2.6 11/13/2022    Imaging:  No results found.  Medications: I have reviewed the patient's current medications.  Assessment/Plan: Sigmoid colon cancer, stage IV (pT4a,pN2b,M1c) Colonoscopy 03/24/2019-3 rectal polyps-hyperplastic polyps, distal colon biopsy-at least intramucosal  adenocarcinoma, completely obstructing mass in the distal sigmoid colon, could not be traversed, intact mismatch repair protein expression 03/24/2019-CEA 56.1 03/29/2019 CT abdomen/pelvis-circumferential thickening involving the entire mid and distal sigmoid colon to the level of the rectosigmoid junction, solid/cystic mass in the left ovary, bilateral nephrolithiasis Robotic assisted low anterior resection, mesenteric lymphadenectomy, bilateral salpingo-oophorectomy 04/08/2019 Pathology ([facility] review of outside pathology) metastatic adenocarcinoma involving the peritoneum overlying the round ligament, serosa of the urinary bladder, serosa of the right ureter a sacral area, pelvic peritoneum, and left ovary.  Omental biopsy with focal mucin pools with no carcinoma cells identified, right hemidiaphragm biopsy involved by metastatic adenocarcinoma, invasive adenocarcinoma the sigmoid colon, moderately differentiated, T4a, perineural and vascular invasion present, 7/22 lymph nodes, multiple tumor deposits, resection margins negative Negative for PD-L1, low probability of MSI-high, HER-2 negative, negative for BRAF, NRAS and KRAS alterations CTs 05/19/2019-no evidence of metastatic disease, findings suspicious for colitis of the transverse and ascending colon, small amount of ascites in the cul-de-sac, bilateral renal calculi Cycle 1 FOLFIRI 05/24/2019, bevacizumab added with cycle 2 Cycle 4 FOLFIRI/bevacizumab 07/05/2019 Cycle 5 FOLFIRI 07/27/2019, bevacizumab held secondary to hypertension Cycle 6 FOLFIRI 08/10/1999, bevacizumab held secondary to hypertension CTs 08/30/2019-no evidence of recurrent disease, new subsolid right lower lobe nodule felt to be inflammatory, emphysema Cycle 7 FOLFIRI 09/26/2019, bevacizumab remains on hold secondary to hypertension Cycle 8 FOLFIRI 10/17/2019, bevacizumab held secondary to hypertension, Udenyca added for neutropenia Cycle 9 FOLFIRI 11/07/2019, bevacizumab held secondary to  hypertension, Udenyca Cycle 10 FOLFIRI 11/28/2019, bevacizumab held, Udenyca Cycle 11 FOLFIRI 12/26/2019, bevacizumab held, Udenyca CTs 01/25/2020-no evidence  of recurrent disease, resolution of right lower lobe nodule Maintenance Xeloda beginning 02/06/2020 CTs 04/25/2020- no evidence of metastatic disease, multiple bilateral renal calculi without hydronephrosis Maintenance Xeloda continued CT abdomen/pelvis without contrast 08/10/2020-bilateral staghorn renal calculi, no evidence of metastatic disease; addendum 08/23/2020-small but increasing omental nodules. Cycle 1 FOLFIRI/panitumumab 09/19/2020 Cycle 2 FOLFIRI/Panitumumab 10/03/2020 Cycle 3 FOLFIRI/Panitumumab 10/17/2020 Cycle 4 FOLFIRI/panitumumab 10/31/2020 Cycle 5 FOLFIRI/Panitumumab 11/14/2020 CTs 11/26/2020-stable omental metastases compared to 10/22/2020, mildly decreased from 08/10/2020.  Left lower quadrant tiny paracolic gutter implant slightly decreased from CT 08/10/2020.  No new or progressive metastatic disease in the abdomen or pelvis. Cycle 6 FOLFIRI/Panitumumab 11/28/2020, irinotecan dose reduced, treatment schedule adjusted to every 3 weeks going forward Cycle 7 FOLFIRI/panitumumab 12/24/2020 Cycle 8 FOLFIRI/Panitumumab 01/16/2021--treatment held due to hypertension in the infusion area. Cycle 8 FOLFIRI/panitumumab 01/21/2021 Cycle 9 FOLFIRI/panitumumab 02/11/2021 Cycle 10 FOLFIRI/Panitumumab 03/04/2021 Cycle 11 FOLFIRI/Panitumumab 03/25/2021 CT abdomen/pelvis 04/10/2021-stable right omental and left posterior paracolic gutter nodules Cycle 12 FOLFIRI/panitumumab 04/15/2021 Cycle 13 FOLFIRI/panitumumab 05/06/2021 Cycle 14 FOLFIRI/Panitumumab 05/27/2021 Cycle 15 FOLFIRI/Panitumumab 06/17/2021 CT abdomen/pelvis 07/05/2021-slight enlargement of a dominant right omental implant, no new implants Patient requested a treatment break CT abdomen/pelvis 10/04/2021-mild increase in size of multiple omental soft tissue nodules, 4 mm calculus in the distal  left ureter adjacent to the ureteral stent Cycle 1 Lonsurf 10/14/2021 10/28/2021 Avastin every 2 weeks Cycle 2 Lonsurf 11/11/2021 Cycle 3 Lonsurf 12/09/2021 Cycle 4 Lonsurf 01/06/2022 Avastin held 01/20/2022 due to proteinuria, 24-hour urine 43 mg CT/pelvis 01/30/2022-possible new peritoneal implant at the transverse colon, no significant change in other omental/peritoneal implants, stable chronic rectal wall thickening, status post removal of double-J left ureteral stent with no evidence of hydronephrosis or ureteral calculus, nonobstructing bilateral renal calculi Cycle 5 Lonsurf 02/05/2022 or 02/06/2022 Avastin every 2 weeks Cycle 6 Lonsurf 03/03/2022 CTs 04/05/2022-increase in size and number of peritoneal implants compared to January 2024 central liver mass narrowing the anterior branch of the right portal vein, decreased biliary duct dilation, biliary stents in place, stable right cardiophrenic lymph nodes, separate pigtail stent in the lumen of the proximal duodenum Cycle 1 FOLFOX 04/14/2022 CT abdomen/pelvis 04/16/2022-stent within the third portion of the duodenum, stable peritoneal nodules, indwelling biliary stents with mild left biliary duct dilation Cycle 2 FOLFOX 04/28/2022, Emend and prophylactic dexamethasone added Cycle 3 FOLFOX 05/12/2022, Aloxi, Emend, prophylactic dexamethasone, Compazine and lorazepam as needed Chemotherapy held 05/25/2020 due to severe hypertension, evaluated in the emergency department Cycle 4 FOLFOX 06/03/2022 Cycle 5 FOLFOX 06/16/2022, oxaliplatin dose reduced and 5-FU bolus eliminated due to neutropenia CT 06/25/2022-unchanged soft tissue at the hepatic hilum, unchanged, bile duct stent, decreased size of peritoneal and omental nodules and diminished volume of likely fluid in the lower left pelvis Cycle 6 FOLFOX 06/30/2022 Cycle 7 FOLFOX 07/21/2022 CT 07/24/2022 (in the emergency department with nausea, headache, abdominal pain)-no acute pathology.  No significant interval  change in omental nodularity. Cycle 8 FOLFOX 08/04/2022, pruritus on the palms at the end of the oxaliplatin infusion, Pepcid and Benadryl given, symptoms resolved, infusion not resumed Cycle 9 FOLFOX 08/18/2022-Pepcid and Benadryl added to premedications, Oxaliplatin diluted in a larger volume and infusion time increased Cycle 10 FOLFOX 09/08/2022 Cycle 11 FOLFOX 09/22/2022 10/06/2022 patient declined treatment CTs 10/15/2022-similar soft tissue fullness in the hepatic hilum with biliary stents in place, decrease in omental/peritoneal metastases Maintenance capecitabine 10/26/2022   Hypertension G4 P3, twins Kidney stones-bilateral staghorn renal calculi on CT 08/10/2020 Infected Port-A-Cath 09/12/2019-placed on Augmentin, referred for Port-A-Cath removal; Port-A-Cath removed 09/14/2019; PICC line placed  09/14/2019; culture staph aureus.  Course of Septra completed. Neutropenia secondary to chemotherapy-Udenyca added with cycle 8 FOLFIRI Right nephrostomy tube 09/12/2020; stent placed 10/09/2020, percutaneous nephrostolithotomy treatment of right-sided kidney stones 10/09/2020, malpositioned right ureter stent replacement 10/23/2020, right ureter stent removed 10/29/2020 Percutaneous left nephrostolithotomy 10/01/2021-no renal stone identified, left ureter stent left in place Cystoscopy 10/14/2021-stent removed 8.  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  Admission 04/16/2022 with nausea/vomiting and abdominal pain, felt to be  acute toxicity related to chemotherapy, discharged home 04/18/2022    Disposition: Ms. Steury has metastatic colon cancer with omental/peritoneal involvement.  She is currently on maintenance Xeloda.  She presents with abdominal pain and constipation.  She has had a few liquid bowel movements today, prior to that last bowel movement was more than a week ago.  She is having significant abdominal pain and began vomiting while in the office.  She may be developing a bowel obstruction.  She had an abdominal x-ray earlier which has not been read.  We are referring her to the emergency department for additional evaluation.  It is unlikely her symptoms are related to Xeloda.     April [name] ANP/GNP-BC   11/19/2022  2:29 PM

## 2022-11-19 NOTE — H&P (Signed)
History and Physical    April [name] WUJ:[id number] DOB: [birth year] DOA: 11/19/2022  PCP: Tollie Eth, NP  Patient coming from: ***  I have personally briefly reviewed patient's old medical records in [facility]  Chief Complaint: ***  HPI: April [name] is a 47 y.o. 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  ***  [facility] ED Course  Labs/Imaging on admission: I have personally reviewed following labs and imaging studies.  ***  Review of Systems: All systems reviewed and are negative except as documented in history of present illness above.   Past Medical History:  Diagnosis Date  . Benign essential hypertension 10/06/2017   Last Assessment & Plan:  Formatting of this note might be different from the original. Patient's blood pressure noted to be elevated at today's visit 188/98.  Patient will be provided with antihypertensives in the treatment center if needed in order to meet her Avastin parameters.  Patient recommended to follow up with her primary care physician regarding medication management.  Patient is not cur  . Cancer (HCC)   . Colon cancer (HCC)   . Hypertension   . Renal calculi    bilateral    Past Surgical History:  Procedure Laterality Date  . BILIARY BRUSHING  03/24/2022   Procedure: BILIARY BRUSHING;  Surgeon: Meridee Score Netty Starring., MD;  [facility] END[facility];  Service: Gastroenterology;;  . BILIARY DILATION  03/24/2022   Procedure: BILIARY DILATION;  Surgeon: Lemar Lofty., MD;  Location: [facility] ENDOSCOPY;  Service: Gastroenterology;;  . BILIARY STENT PLACEMENT  03/24/2022   Procedure: BILIARY STENT PLACEMENT;  Surgeon: Lemar Lofty., MD;  Location: [facility]  ENDOSCOPY;  Service: Gastroenterology;;  . BIOPSY  03/24/2022   Procedure: BIOPSY;  Surgeon: Lemar Lofty., MD;  Location: [facility] ENDOSCOPY;  Service: Gastroenterology;;  . CESAREAN SECTION     x2  . CYSTOSCOPY WITH RETROGRADE PYELOGRAM, URETEROSCOPY AND STENT PLACEMENT Right 10/22/2020   Procedure: CYSTOSCOPY WITH RETROGRADE PYELOGRAM, URETEROSCOPY AND STENT PLACEMENT;  Surgeon: Noel Christmas, MD;  Location: [facility] ORS;  Service: Urology;  Laterality: Right;  . ERCP N/A 03/24/2022   Procedure: ENDOSCOPIC RETROGRADE CHOLANGIOPANCREATOGRAPHY (ERCP);  Surgeon: Lemar Lofty., MD;  Location: [facility] ENDOSCOPY;  Service: Gastroenterology;  Laterality: N/A;  . IR RADIOLOGIST EVAL & MGMT  09/16/2019  . IR RADIOLOGIST EVAL & MGMT  09/23/2019  . IR RADIOLOGIST EVAL & MGMT  09/20/2019  . IR RADIOLOGIST EVAL & MGMT  09/30/2019  . IR REMOVAL TUN ACCESS W/ PORT W/O FL MOD SED  09/14/2019  . IR VENO/EXT/UNI RIGHT  04/11/2022  . PANCREATIC STENT PLACEMENT  03/24/2022   Procedure: PANCREATIC STENT PLACEMENT;  Surgeon: Meridee Score Netty Starring., MD;  Location: [facility] ENDOSCOPY;  Service: Gastroenterology;;  . REMOVAL OF STONES  03/24/2022   Procedure: REMOVAL OF STONES;  Surgeon: Lemar Lofty., MD;  Location: [facility] ENDOSCOPY;  Service: Gastroenterology;;  . Dennison Mascot  03/24/2022   Procedure: Dennison Mascot;  Surgeon: Lemar Lofty., MD;  Location: [facility] ENDOSCOPY;  Service: Gastroenterology;;  . URETERAL STENT PLACEMENT      Social History:  reports that she has quit smoking. Her smoking use included cigarettes. She has never used smokeless tobacco. She reports that she does not currently use alcohol. She reports that she does not currently use drugs after having used  the following drugs: Hydrocodone.  Allergies  Allergen Reactions  . Lisinopril Swelling and Other (See Comments)    Lip swelling and Angioedema  . Irinotecan Other (See Comments)    Stomach cramping Patient given 0.5 mL  atropine IV and was able to continue infusion.  See Progress note on 10/03/20.   Marland Kitchen Leucovorin Calcium Itching    Patient reported itchy palms towards end of transfusion, see progress note 08/04/22.   Marland Kitchen Oxaliplatin Itching    Patient reported itchy palms towards end of transfusion, see progress note on 08/04/22.    Family History  Problem Relation Age of Onset  . Hypertension Mother   . Diabetes Mellitus II Father   . Stroke Father   . Ulcers Sister   . Colon cancer Neg Hx   . Esophageal cancer Neg Hx   . Inflammatory bowel disease Neg Hx   . Liver disease Neg Hx   . Pancreatic cancer Neg Hx   . Rectal cancer Neg Hx   . Stomach cancer Neg Hx      Prior to Admission medications   Medication Sig Start Date End Date Taking? Authorizing Provider  acetaminophen (ACETAMINOPHEN 8 HOUR) 650 MG CR tablet Take 650 mg by mouth every 8 (eight) hours as needed for pain.    [provider]  amLODipine (NORVASC) 10 MG tablet Take 1 tablet (10 mg total) by mouth daily. 10/21/22   Tollie Eth, NP  capecitabine (XELODA) 500 MG tablet Take 2 tablets (1,000 mg total) by mouth 2 (two) times daily after a meal. 11/18/22   Ladene Artist, MD  chlorthalidone (HYGROTON) 25 MG tablet Take 1 tablet (25 mg total) by mouth daily. Patient taking differently: Take 25 mg by mouth once a week. 06/02/22   Tollie Eth, NP  dexamethasone (DECADRON) 4 MG tablet Take 1 tablet by mouth twice a day for 3 days beginning day 2 of chemo. Patient not taking: Reported on 11/11/2022 08/04/22   Rana Snare, NP  diclofenac Sodium (VOLTAREN) 1 % GEL Research Patient: Apply 0.5 grams (1 fingertip) to each hand and each foot twice daily for up to 12 weeks Patient not taking: Reported on 11/11/2022 10/27/22   Ladene Artist, MD  HYDROcodone-acetaminophen (NORCO) 10-325 MG tablet Take 1-2 tablets by mouth every 6 (six) hours as needed. 10/20/22   Ladene Artist, MD  Polyethylene Glycol 3350 (MIRALAX PO) Take 17 g by  mouth daily.    [provider]  potassium chloride (MICRO-K) 10 MEQ CR capsule Take 2 capsules (20 mEq total) by mouth 2 (two) times daily. 09/08/22   Ladene Artist, MD  prochlorperazine (COMPAZINE) 10 MG tablet Take 1 tablet (10 mg total) by mouth every 6 (six) hours as needed for nausea or vomiting. Patient not taking: Reported on 11/11/2022 04/28/22   Rana Snare, NP  senna-docusate (SENOKOT-S) 8.6-50 MG tablet Take 1 tablet by mouth 2 (two) times daily. Patient not taking: Reported on 07/25/2022 04/18/22   Glade Lloyd, MD    Physical Exam: Vitals:   11/19/22 1930 11/19/22 2130 11/19/22 2145 11/19/22 2245  BP: (!) 166/122 (!) 198/143 (!) 204/135 (!) 183/134  Pulse: 86 76 73 83  Resp:  16 19 18   Temp:    98.5 F (36.9 C)  TempSrc:    Oral  SpO2: 97% 100% 100% 100%  Weight:      Height:       *** Constitutional: NAD, calm, comfortable Eyes: PERRL, lids and  conjunctivae normal ENMT: Mucous membranes are moist. Posterior pharynx clear of any exudate or lesions.Normal dentition.  Neck: normal, supple, no masses. Respiratory: clear to auscultation bilaterally, no wheezing, no crackles. Normal respiratory effort. No accessory muscle use.  Cardiovascular: Regular rate and rhythm, no murmurs / rubs / gallops. No extremity edema. 2+ pedal pulses. Abdomen: no tenderness, no masses palpated. No hepatosplenomegaly. Bowel sounds positive.  Musculoskeletal: no clubbing / cyanosis. No joint deformity upper and lower extremities. Good ROM, no contractures. Normal muscle tone.  Skin: no rashes, lesions, ulcers. No induration Neurologic: CN 2-12 grossly intact. Sensation intact. Strength 5/5 in all 4.  Psychiatric: Normal judgment and insight. Alert and oriented x 3. Normal mood.   EKG: Personally reviewed. Sinus rhythm, rate 87, no acute ischemic changes.  Similar to previous.  Assessment/Plan Principal Problem:   Cancer related pain   *** No notes on file *** Assessment and  Plan: No notes have been filed under this hospital service. Service: Hospitalist  Stage IV colon cancer with omental/peritoneal involvement Uncontrolled cancer associated pain: ***  Hypertension: ***  Hypokalemia: ***    DVT prophylaxis: ***  Code Status: ***  Family Communication: ***  Disposition Plan: ***  Consults called: ***  Severity of Illness: {Observation/Inpatient:21159}  Darreld Mclean MD [facility]  If 7PM-7AM, please contact night-coverage [email or web address]  11/19/2022, 11:09 PM  to ensure the patient's safety. The patient's presenting symptoms, physical exam findings, and initial radiographic and laboratory data in the context of their medical condition is felt to place them at decreased risk for further clinical deterioration. Furthermore, it is anticipated that the patient will be medically stable for discharge from the hospital within 2 midnights of admission.   Darreld Mclean MD [facility]  If 7PM-7AM, please contact night-coverage [email or web address]  11/20/2022, 12:36 AM

## 2022-11-19 NOTE — ED Provider Notes (Signed)
[place] EMERGENCY DEPARTMENT AT [facility] Provider Note   CSN: [id number] Arrival date & time: 11/19/22  1529     History  Chief Complaint  Patient presents with   Abdominal Pain    April [name] is a 47 y.o. female.   Abdominal Pain Patient with rectal pain and abdominal pain.  Has had for the last couple weeks.  History of colon cancer with omental involvement.  Has had some constipation and has been taking laxatives without much relief.  No fevers.  States she has been vomiting.  States she did have a bowel movement that did not really relieve the pain.  Seen at oncology and sent down for further evaluation.   "Ms. Sheerin has metastatic colon cancer with omental/peritoneal involvement.  She is currently on maintenance Xeloda.  She presents with abdominal pain and constipation.  She has had a few liquid bowel movements today, prior to that last bowel movement was more than a week ago.  She is having significant abdominal pain and began vomiting while in the office.  She may be developing a bowel obstruction.  She had an abdominal x-ray earlier which has not been read.  We are referring her to the emergency department for additional evaluation.  It is unlikely her symptoms are related to Xeloda.       Lonna Cobb ANP/GNP-BC "   Past Medical History:  Diagnosis Date   Benign essential hypertension 10/06/2017   Last Assessment & Plan:  Formatting of this note might be different from the original. Patient's blood pressure noted to be elevated at today's visit 188/98.  Patient will be provided with antihypertensives in the treatment center if needed in order to meet her Avastin parameters.  Patient recommended to follow up with her primary care physician regarding medication management.  Patient is not cur   Cancer [facility])    Colon cancer (HCC)    Hypertension    Renal calculi    bilateral   Past Surgical History:  Procedure Laterality Date   BILIARY BRUSHING   03/24/2022   Procedure: BILIARY BRUSHING;  Surgeon: Meridee Score Netty Starring., MD;  Location: [facility] ENDOSCOPY;  Service: Gastroenterology;;   BILIARY DILATION  03/24/2022   Procedure: BILIARY DILATION;  Surgeon: Lemar Lofty., MD;  Location: [facility] ENDOSCOPY;  Service: Gastroenterology;;   BILIARY STENT PLACEMENT  03/24/2022   Procedure: BILIARY STENT PLACEMENT;  Surgeon: Lemar Lofty., MD;  Location: [facility] ENDOSCOPY;  Service: Gastroenterology;;   BIOPSY  03/24/2022   Procedure: BIOPSY;  Surgeon: Lemar Lofty., MD;  Location: [facility] ENDOSCOPY;  Service: Gastroenterology;;   CESAREAN SECTION     x2   CYSTOSCOPY WITH RETROGRADE PYELOGRAM, URETEROSCOPY AND STENT PLACEMENT Right 10/22/2020   Procedure: CYSTOSCOPY WITH RETROGRADE PYELOGRAM, URETEROSCOPY AND STENT PLACEMENT;  Surgeon: Noel Christmas, MD;  Location: [facility] ORS;  Service: Urology;  Laterality: Right;   ERCP N/A 03/24/2022   Procedure: ENDOSCOPIC RETROGRADE CHOLANGIOPANCREATOGRAPHY (ERCP);  Surgeon: Lemar Lofty., MD;  Location: [facility] ENDOSCOPY;  Service: Gastroenterology;  Laterality: N/A;   IR RADIOLOGIST EVAL & MGMT  09/16/2019   IR RADIOLOGIST EVAL & MGMT  09/23/2019   IR RADIOLOGIST EVAL & MGMT  09/20/2019   IR RADIOLOGIST EVAL & MGMT  09/30/2019   IR REMOVAL TUN ACCESS W/ PORT W/O FL MOD SED  09/14/2019   IR VENO/EXT/UNI RIGHT  04/11/2022   PANCREATIC STENT PLACEMENT  03/24/2022   Procedure: PANCREATIC STENT PLACEMENT;  Surgeon: Lemar Lofty., MD;  Location: [facility] ENDOSCOPY;  Service: Gastroenterology;;   REMOVAL OF STONES  03/24/2022   Procedure: REMOVAL OF STONES;  Surgeon: Meridee Score Netty Starring., MD;  Location: [facility] ENDOSCOPY;  Service: Gastroenterology;;   Dennison Mascot  03/24/2022   Procedure: Dennison Mascot;  Surgeon: Mansouraty, Netty Starring., MD;  Location: [facility] ENDOSCOPY;  Service: Gastroenterology;;   URETERAL STENT PLACEMENT       Home Medications Prior to Admission medications   Medication Sig Start  Date End Date Taking? Authorizing Provider  acetaminophen (ACETAMINOPHEN 8 HOUR) 650 MG CR tablet Take 650 mg by mouth every 8 (eight) hours as needed for pain.    [provider]  amLODipine (NORVASC) 10 MG tablet Take 1 tablet (10 mg total) by mouth daily. 10/21/22   Tollie Eth, NP  capecitabine (XELODA) 500 MG tablet Take 2 tablets (1,000 mg total) by mouth 2 (two) times daily after a meal. 11/18/22   Ladene Artist, MD  chlorthalidone (HYGROTON) 25 MG tablet Take 1 tablet (25 mg total) by mouth daily. Patient taking differently: Take 25 mg by mouth once a week. 06/02/22   Tollie Eth, NP  dexamethasone (DECADRON) 4 MG tablet Take 1 tablet by mouth twice a day for 3 days beginning day 2 of chemo. Patient not taking: Reported on 11/11/2022 08/04/22   Rana Snare, NP  diclofenac Sodium (VOLTAREN) 1 % GEL Research Patient: Apply 0.5 grams (1 fingertip) to each hand and each foot twice daily for up to 12 weeks Patient not taking: Reported on 11/11/2022 10/27/22   Ladene Artist, MD  HYDROcodone-acetaminophen (NORCO) 10-325 MG tablet Take 1-2 tablets by mouth every 6 (six) hours as needed. 10/20/22   Ladene Artist, MD  Polyethylene Glycol 3350 (MIRALAX PO) Take 17 g by mouth daily.    [provider]  potassium chloride (MICRO-K) 10 MEQ CR capsule Take 2 capsules (20 mEq total) by mouth 2 (two) times daily. 09/08/22   Ladene Artist, MD  prochlorperazine (COMPAZINE) 10 MG tablet Take 1 tablet (10 mg total) by mouth every 6 (six) hours as needed for nausea or vomiting. Patient not taking: Reported on 11/11/2022 04/28/22   Rana Snare, NP  senna-docusate (SENOKOT-S) 8.6-50 MG tablet Take 1 tablet by mouth 2 (two) times daily. Patient not taking: Reported on 07/25/2022 04/18/22   Glade Lloyd, MD      Allergies    Lisinopril, Irinotecan, Leucovorin calcium, and Oxaliplatin    Review of Systems   Review of Systems  Gastrointestinal:  Positive for abdominal pain.     Physical Exam Updated Vital Signs BP (!) 166/122   Pulse 86   Temp 98.2 F (36.8 C) (Oral)   Resp (!) 22   Ht 5\' 4"  (1.626 m)   Wt 64.9 kg   LMP 01/07/2006   SpO2 97%   BMI 24.55 kg/m  Physical Exam Vitals and nursing note reviewed.  Constitutional:      Comments: Appears uncomfortable laying on her side.  Cardiovascular:     Rate and Rhythm: Normal rate.  Pulmonary:     Breath sounds: Normal breath sounds.  Abdominal:     Comments: No distention.  Some mild diffuse tenderness.  Neurological:     Mental Status: She is alert.     ED Results / Procedures / Treatments   Labs (all labs ordered are listed, but only abnormal results are displayed) Labs Reviewed  COMPREHENSIVE METABOLIC PANEL - Abnormal; Notable for the following components:      Result Value  Potassium 3.1 (*)    Calcium 10.6 (*)    Total Protein 8.6 (*)    Alkaline Phosphatase 159 (*)    All other components within normal limits  CBC WITH DIFFERENTIAL/PLATELET - Abnormal; Notable for the following components:   Eosinophils Absolute 0.6 (*)    All other components within normal limits  LIPASE, BLOOD    EKG None  Radiology CT ABDOMEN PELVIS W CONTRAST  Result Date: 11/19/2022 CLINICAL DATA:  Colon cancer, recent chemotherapy. Abdominal pain with low back pain and pelvic pain. * Tracking Code: BO * EXAM: CT ABDOMEN AND PELVIS WITH CONTRAST TECHNIQUE: Multidetector CT imaging of the abdomen and pelvis was performed using the standard protocol following bolus administration of intravenous contrast. RADIATION DOSE REDUCTION: This exam was performed according to the departmental dose-optimization program which includes automated exposure control, adjustment of the mA and/or kV according to patient size and/or use of iterative reconstruction technique. CONTRAST:  80mL OMNIPAQUE IOHEXOL 300 MG/ML  SOLN COMPARISON:  10/15/2022 CT scan FINDINGS: Lower chest: Unremarkable Hepatobiliary: Two biliary stents  remain in place dilated and thick-walled right and left hepatic ducts with associated surrounding hypoenhancement similar to the 10/15/2022 exam. Small amount of pneumobilia. The overall degree of peripheral intrahepatic biliary dilatation is minimally increased from prior. 6 mm left hepatic lobe cyst on image 7 series 2 unchanged. Pancreas: Mild dilation of the dorsal pancreatic duct in the pancreatic head similar to prior. Spleen: Unremarkable Adrenals/Urinary Tract: Stable appearance of bilateral nonobstructive renal calculi. Mild scarring in portions of the right kidney. Both adrenal glands appear normal. No hydronephrosis or hydroureter. Urinary bladder relatively empty but otherwise unremarkable. Stomach/Bowel: Substantial circumferential wall thickening in the rectum, residual tumor not excluded. No current dilated bowel. Normal appendix. Vascular/Lymphatic: Mild atheromatous vascular calcification of the common iliac arteries. Reproductive: Poor definition of fat planes between the uterus on the thick-walled rectum. Other: Enlarging tumor studding along the left paracolic gutter, with a nodule for example measuring 1.6 by 1.3 cm on image 34 series 2 and previously measuring 0.9 by 0.8 cm. Additional tumor studding along the left paracolic gutter is worsened. Mild change in orientation of the omentum complicates comparison of the omental nodularity. Most of this nodularity is fairly stable although components such as the 1.2 by 1.1 cm omental soft tissue nodule on image 46 of series 2 appear increased. Musculoskeletal: Moderate to prominent left and moderate right degenerative hip arthropathy. Levoconvex lumbar scoliosis. 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 IMPRESSION: 1. Worsening tumor studding along the left paracolic gutter. 2. Mildly worsening omental nodularity. 3. Substantial circumferential wall thickening in the rectum, residual tumor not excluded.  Poor definition of fat planes between the uterus and the thick-walled rectum. 4. Two biliary stents remain in place with dilated and thick-walled right and left hepatic ducts with associated surrounding hypoenhancement similar to the 10/15/2022 exam. The overall degree of peripheral intrahepatic biliary dilatation is minimally increased from prior. 5. Stable appearance of bilateral nonobstructive renal calculi. 6. 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 7. Moderate to prominent left and moderate right degenerative hip arthropathy. 8. Levoconvex lumbar scoliosis. 9. Mild atheromatous vascular calcification of the common iliac arteries. Electronically Signed   By: Gaylyn Rong M.D.   On: 11/19/2022 20:00   DG Abd 2 Views  Result Date: 11/19/2022 CLINICAL DATA:  Abdominal pain.  History of metastatic colon cancer EXAM: ABDOMEN - 2  VIEW COMPARISON:  06/23/2022; CT abdomen pelvis-06/25/2022 FINDINGS: Paucity of bowel gas without definitive evidence of enteric obstruction. No pneumoperitoneum, pneumatosis or portal venous gas Punctate opacities overlie the bilateral renal fossa compatible with known bilateral nephrolithiasis. Punctate phlebolith overlies the right hemipelvis, unchanged. Biliary stents overlie the right upper abdominal quadrant. Limited visualization of the lower thorax is normal given obliquity and technique Mild scoliotic curvature of the thoracolumbar spine. No definite acute osseous abnormalities. IMPRESSION: 1. Paucity of bowel gas without definitive evidence of enteric obstruction. 2. Bilateral nephrolithiasis. Electronically Signed   By: Simonne Come M.D.   On: 11/19/2022 15:38    Procedures Procedures    Medications Ordered in ED Medications  HYDROmorphone (DILAUDID) injection 1 mg (has no administration in time range)  HYDROmorphone (DILAUDID) injection 1 mg (1 mg Intravenous Given 11/19/22 1619)  sodium chloride 0.9 % bolus 500 mL (0 mLs  Intravenous Stopped 11/19/22 1819)  ondansetron (ZOFRAN) injection 4 mg (4 mg Intravenous Given 11/19/22 1625)  iohexol (OMNIPAQUE) 300 MG/ML solution 100 mL (80 mLs Intravenous Contrast Given 11/19/22 1745)    ED Course/ Medical Decision Making/ A&P                                 Medical Decision Making Amount and/or Complexity of Data Reviewed Labs: ordered. Radiology: ordered.  Risk Prescription drug management.   Patient with abdominal pain.  Nausea vomiting.  Also rectal pain.  Has metastatic cancer with omental involvement.  Differential diagnosis includes cancer pain, obstruction, dehydration.  Will get basic blood work give treatment with fluids and pain medicines.  Previous history of prolonged QT and will check QT today.  Will get CT scan to evaluate.    I reviewed oncology note.  CT scan shows potentially worsening of malignancy.  I think this is the cause of the pain.  Feels somewhat better after Dilaudid but pain returning.  Does not think to be managed with oral pain control.  I think with recurrent dose of IV narcotics needed patient benefit from admission to the hospital for further pain control.  Does not appear obstructed.        Final Clinical Impression(s) / ED Diagnoses Final diagnoses:  Cancer associated pain    Rx / DC Orders ED Discharge Orders     None         Benjiman Core, MD 11/19/22 2040

## 2022-11-19 NOTE — Plan of Care (Signed)

## 2022-11-19 NOTE — ED Triage Notes (Signed)
Seen at [facility] today, C/o abd pain, lower back pain, and rectum pain x 2 weeks. Hx of colon cancer. Recently finished chemo.

## 2022-11-19 NOTE — Progress Notes (Signed)
Plan of Care Note for accepted transfer   Patient: April [name] MRN: [id number]   DOA: 11/19/2022  Facility requesting transfer: [facility] Requesting Provider: Benjiman Core, MD  Reason for transfer: Uncontrolled CA related pain  Facility course:   7 F with metastatic colon CA, presented to onc clinic with worsening pain, N/V and constipation. CT with no obstruction, progression of cancer. May not know the extent of her cancer (stage IV) per ED provider? Requiring IV pain medication.   Plan of care: The patient is accepted for admission to Med-surg  unit, at [facility]..    Author: Dolly Rias, MD 11/19/2022  Check [email or web address] for on-call coverage.  Nursing staff, Please call [facility] Admits & Consults System-Wide number on Amion as soon as patient's arrival, so appropriate admitting provider can evaluate the pt.

## 2022-11-19 NOTE — ED Notes (Addendum)
MD Made aware of HTN. No new orders.

## 2022-11-20 DIAGNOSIS — C787 Secondary malignant neoplasm of liver and intrahepatic bile duct: Secondary | ICD-10-CM

## 2022-11-20 DIAGNOSIS — E876 Hypokalemia: Secondary | ICD-10-CM

## 2022-11-20 DIAGNOSIS — C19 Malignant neoplasm of rectosigmoid junction: Secondary | ICD-10-CM | POA: Diagnosis not present

## 2022-11-20 DIAGNOSIS — R1084 Generalized abdominal pain: Secondary | ICD-10-CM | POA: Diagnosis not present

## 2022-11-20 DIAGNOSIS — K5900 Constipation, unspecified: Secondary | ICD-10-CM | POA: Diagnosis not present

## 2022-11-20 DIAGNOSIS — I10 Essential (primary) hypertension: Secondary | ICD-10-CM

## 2022-11-20 DIAGNOSIS — C189 Malignant neoplasm of colon, unspecified: Secondary | ICD-10-CM

## 2022-11-20 DIAGNOSIS — R935 Abnormal findings on diagnostic imaging of other abdominal regions, including retroperitoneum: Secondary | ICD-10-CM | POA: Diagnosis not present

## 2022-11-20 DIAGNOSIS — G893 Neoplasm related pain (acute) (chronic): Secondary | ICD-10-CM | POA: Diagnosis not present

## 2022-11-20 LAB — BASIC METABOLIC PANEL
Anion gap: 12 (ref 5–15)
BUN: 7 mg/dL (ref 6–20)
CO2: 25 mmol/L (ref 22–32)
Calcium: 9.1 mg/dL (ref 8.9–10.3)
Chloride: 100 mmol/L (ref 98–111)
Creatinine, Ser: 0.5 mg/dL (ref 0.44–1.00)
GFR, Estimated: >60 mL/min (ref >60)
Glucose, Bld: 88 mg/dL (ref 70–99)
Potassium: 3.1 mmol/L — ABNORMAL LOW (ref 3.5–5.1)
Sodium: 137 mmol/L (ref 135–145)

## 2022-11-20 LAB — CBC
HCT: 38.4 % (ref 36.0–46.0)
Hemoglobin: 13 g/dL (ref 12.0–15.0)
MCH: 31.3 pg (ref 26.0–34.0)
MCHC: 33.9 g/dL (ref 30.0–36.0)
MCV: 92.3 fL (ref 80.0–100.0)
Platelets: 242 10*3/uL (ref 150–400)
RBC: 4.16 MIL/uL (ref 3.87–5.11)
RDW: 13.6 % (ref 11.5–15.5)
WBC: 6.7 10*3/uL (ref 4.0–10.5)
nRBC: 0 % (ref 0.0–0.2)

## 2022-11-20 LAB — MAGNESIUM: Magnesium: 2 mg/dL (ref 1.7–2.4)

## 2022-11-20 MED ORDER — PEG-KCL-NACL-NASULF-NA ASC-C 100 G PO SOLR
0.5000 | Freq: Once | ORAL | Status: AC
  Administered 2022-11-21: 100 g via ORAL

## 2022-11-20 MED ORDER — BISACODYL 5 MG PO TBEC: 5.0000 mg | DELAYED_RELEASE_TABLET | Freq: Every day | ORAL | Status: DC | PRN

## 2022-11-20 MED ORDER — ACETAMINOPHEN 325 MG PO TABS: 650.0000 mg | ORAL_TABLET | Freq: Four times a day (QID) | ORAL | Status: DC | PRN

## 2022-11-20 MED ORDER — CHLORTHALIDONE 25 MG PO TABS
25.0000 mg | ORAL_TABLET | Freq: Every day | ORAL | Status: DC
  Administered 2022-11-20 – 2022-11-24 (×5): 25 mg via ORAL
  Filled 2022-11-20 (×5): qty 1

## 2022-11-20 MED ORDER — POTASSIUM CHLORIDE CRYS ER 20 MEQ PO TBCR
40.0000 meq | EXTENDED_RELEASE_TABLET | ORAL | Status: AC
Start: 2022-11-20 — End: 2022-11-20
  Administered 2022-11-20 (×2): 40 meq via ORAL
  Filled 2022-11-20 (×2): qty 2

## 2022-11-20 MED ORDER — HYDROCODONE-ACETAMINOPHEN 10-325 MG PO TABS
1.0000 | ORAL_TABLET | Freq: Four times a day (QID) | ORAL | Status: DC | PRN
  Administered 2022-11-20 – 2022-11-24 (×12): 2 via ORAL
  Filled 2022-11-20 (×13): qty 2

## 2022-11-20 MED ORDER — PROCHLORPERAZINE EDISYLATE 10 MG/2ML IJ SOLN
10.0000 mg | Freq: Four times a day (QID) | INTRAMUSCULAR | Status: DC | PRN
  Administered 2022-11-20 – 2022-11-22 (×4): 10 mg via INTRAVENOUS
  Filled 2022-11-20 (×4): qty 2

## 2022-11-20 MED ORDER — SENNOSIDES-DOCUSATE SODIUM 8.6-50 MG PO TABS
1.0000 | ORAL_TABLET | Freq: Two times a day (BID) | ORAL | Status: DC
  Administered 2022-11-20 – 2022-11-24 (×6): 1 via ORAL
  Filled 2022-11-20 (×7): qty 1

## 2022-11-20 MED ORDER — MORPHINE SULFATE (PF) 2 MG/ML IV SOLN
2.0000 mg | INTRAVENOUS | Status: DC | PRN
  Administered 2022-11-23 – 2022-11-24 (×3): 2 mg via INTRAVENOUS
  Filled 2022-11-20 (×3): qty 1

## 2022-11-20 MED ORDER — HYDRALAZINE HCL 20 MG/ML IJ SOLN
10.0000 mg | INTRAMUSCULAR | Status: DC | PRN
  Administered 2022-11-20: 10 mg via INTRAVENOUS
  Filled 2022-11-20: qty 1

## 2022-11-20 MED ORDER — PEG-KCL-NACL-NASULF-NA ASC-C 100 G PO SOLR
0.5000 | Freq: Once | ORAL | Status: AC
  Administered 2022-11-20: 100 g via ORAL
  Filled 2022-11-20: qty 1

## 2022-11-20 MED ORDER — ACETAMINOPHEN 650 MG RE SUPP: 650.0000 mg | Freq: Four times a day (QID) | RECTAL | Status: DC | PRN

## 2022-11-20 MED ORDER — POLYETHYLENE GLYCOL 3350 17 G PO PACK
17.0000 g | PACK | Freq: Every day | ORAL | Status: DC
  Administered 2022-11-20: 17 g via ORAL
  Filled 2022-11-20: qty 1

## 2022-11-20 MED ORDER — HYDRALAZINE HCL 20 MG/ML IJ SOLN
10.0000 mg | INTRAMUSCULAR | Status: DC | PRN
  Administered 2022-11-20 – 2022-11-21 (×4): 10 mg via INTRAVENOUS
  Filled 2022-11-20 (×5): qty 1

## 2022-11-20 MED ORDER — AMLODIPINE BESYLATE 10 MG PO TABS
10.0000 mg | ORAL_TABLET | Freq: Every day | ORAL | Status: DC
  Administered 2022-11-20 – 2022-11-24 (×5): 10 mg via ORAL
  Filled 2022-11-20 (×5): qty 1

## 2022-11-20 MED ORDER — POTASSIUM CHLORIDE 20 MEQ PO PACK
40.0000 meq | PACK | Freq: Every day | ORAL | Status: DC
  Filled 2022-11-20: qty 2

## 2022-11-20 MED ORDER — DICYCLOMINE HCL 20 MG PO TABS
20.0000 mg | ORAL_TABLET | Freq: Three times a day (TID) | ORAL | Status: DC | PRN
  Administered 2022-11-23: 20 mg via ORAL
  Filled 2022-11-20: qty 1

## 2022-11-20 MED ORDER — FLEET ENEMA RE ENEM: 1.0000 | ENEMA | RECTAL | Status: DC

## 2022-11-20 MED ORDER — POTASSIUM CHLORIDE 20 MEQ PO PACK
40.0000 meq | PACK | Freq: Once | ORAL | Status: AC
  Administered 2022-11-20: 40 meq via ORAL
  Filled 2022-11-20: qty 2

## 2022-11-20 MED ORDER — POLYETHYLENE GLYCOL 3350 17 G PO PACK
17.0000 g | PACK | Freq: Two times a day (BID) | ORAL | Status: DC
  Administered 2022-11-20 – 2022-11-24 (×6): 17 g via ORAL
  Filled 2022-11-20 (×6): qty 1

## 2022-11-20 MED ORDER — SENNOSIDES-DOCUSATE SODIUM 8.6-50 MG PO TABS
1.0000 | ORAL_TABLET | Freq: Every evening | ORAL | Status: DC | PRN
  Administered 2022-11-21: 1 via ORAL

## 2022-11-20 MED ORDER — PEG-KCL-NACL-NASULF-NA ASC-C 100 G PO SOLR: 1.0000 | Freq: Once | ORAL | Status: DC

## 2022-11-20 MED ORDER — ENOXAPARIN SODIUM 40 MG/0.4ML IJ SOSY
40.0000 mg | PREFILLED_SYRINGE | INTRAMUSCULAR | Status: DC
  Administered 2022-11-21 – 2022-11-23 (×3): 40 mg via SUBCUTANEOUS
  Filled 2022-11-20 (×4): qty 0.4

## 2022-11-20 NOTE — Progress Notes (Signed)
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   Assessment & Plan:   Principal Problem:   Cancer related pain Active Problems:   Hypertension   Hypokalemia   Colorectal cancer, stage IV (HCC)   Constipation  #1 stage IV colon cancer with omental/peritoneal involvement/cancer associated pain/constipation -Patient noted to have presented worsening abdominal and rectal pain which was worse with bowel movements with associated bowel cramping. -Abdominal films done with paucity of bowel gas without definite evidence of enteric obstruction.  Bilateral nephrolithiasis. -CT abdomen and pelvis done negative for bowel obstruction however did show changes concerning for enlarging tumor starting along the left paracolic gutter and mildly worsening omental nodularity. -Patient noted to have bowel movement with some clinical improvement however still with abdominal cramping. -Pain currently controlled on current pain regiment. -Patient seen in consultation by hematology/oncology Dr. Truett Perna who reviewed abdominal films and recommended GI consultation to consider sigmoidoscopy to evaluate the rectum. -Increase MiraLAX to twice daily, increase Senokot-S to twice daily.  -Supportive care.  2.  Hypertension -Continue home regimen Norvasc 10 mg daily. -Resume home regimen chlorthalidone 25 mg daily. -IV hydralazine as needed.  3.  Hypokalemia -Magnesium at 2.0. -K-Dur 40 mEq p.o. every 4 hours x 2 doses. -May need daily  oral potassium supplementation as patient on chlorthalidone for hypertension.  4.  Constipation -Increase MiraLAX to twice daily. -Senokot-S twice daily.    DVT prophylaxis: Lovenox Code Status: Full Family Communication: Updated patient.  No family at bedside. Disposition: TBD  Status is: Observation The patient remains OBS appropriate and will d/c before 2 midnights.   Consultants:  Hematology/oncology: Dr. Truett Perna  Procedures:  CT abdomen and pelvis 11/19/2022 Abdominal x-ray 11/19/2022   Antimicrobials:  Anti-infectives (From admission, onward)    None         Subjective: Patient still with some abdominal pain noted had some abdominal cramping prior to bowel movements and some cramping after bowel movement.  Overall some improvement with abdominal pain on current pain regiment.  No emesis.  States supposed to have a procedure with Dr. Meridee Score in December.  Objective: Vitals:   11/19/22 2145 11/19/22 2245 11/20/22 0437 11/20/22 0901  BP: (!) 204/135 (!) 183/134 (!) 161/121 (!) 158/138  Pulse: 73 83 88   Resp: 19 18 18    Temp:  98.5 F (36.9 C) 98.7 F (37.1 C)   TempSrc:  Oral Oral   SpO2: 100% 100% 100%   Weight:      Height:        Intake/Output Summary (Last 24 hours) at 11/20/2022 1540 Last data filed at 11/20/2022 0200 Gross per 24 hour  Intake 750 ml  Output --  Net 750 ml   Filed Weights   11/19/22 1820  Weight: 64.9 kg    Examination:  General exam: Appears calm and comfortable  Respiratory system: Clear to auscultation. Respiratory effort normal. Cardiovascular system: S1 & S2 heard, RRR. No JVD, murmurs, rubs, gallops or clicks. No pedal edema. Gastrointestinal system: Abdomen is nondistended, soft and nontender. No organomegaly or masses felt. Normal bowel sounds heard.  Central nervous system: Alert and oriented. No focal neurological deficits. Extremities: Symmetric 5 x 5 power. Skin: No rashes, lesions or ulcers Psychiatry:  Judgement and insight appear normal. Mood & affect appropriate.     Data Reviewed: I have personally reviewed following labs and imaging studies  CBC: Recent Labs  Lab 11/19/22 1619 11/20/22 0550  WBC 7.5 6.7  NEUTROABS 4.2  --   HGB 14.5 13.0  HCT 43.0 38.4  MCV 91.7 92.3  PLT 285 242    Basic Metabolic Panel: Recent Labs  Lab 11/19/22 1619 11/20/22 0550  NA 139 137  K 3.1* 3.1*  CL 98 100  CO2 28 25  GLUCOSE 86 88  BUN 7 7  CREATININE 0.57 0.50  CALCIUM 10.6* 9.1  MG  --  2.0    GFR: Estimated Creatinine Clearance: 74.3 mL/min (by C-G formula based on SCr of 0.5 mg/dL).  Liver Function Tests: Recent Labs  Lab 11/19/22 1619  AST 22  ALT 13  ALKPHOS 159*  BILITOT 0.4  PROT 8.6*  ALBUMIN 4.4    CBG: No results for input(s): "GLUCAP" in the last 168 hours.   No results found for this or any previous visit (from the past 240 hour(s)).       Radiology Studies: CT ABDOMEN PELVIS W CONTRAST  Result Date: 11/19/2022 CLINICAL DATA:  Colon cancer, recent chemotherapy. Abdominal pain with low back pain and pelvic pain. * Tracking Code: BO * EXAM: CT ABDOMEN AND PELVIS WITH CONTRAST TECHNIQUE: Multidetector CT imaging of the abdomen and pelvis was performed using the standard protocol following bolus administration of intravenous contrast. RADIATION DOSE REDUCTION: This exam was performed according to the departmental dose-optimization program which includes automated exposure control, adjustment of the mA and/or kV according to patient size and/or use of iterative reconstruction technique. CONTRAST:  80mL OMNIPAQUE IOHEXOL 300 MG/ML  SOLN COMPARISON:  10/15/2022 CT scan FINDINGS: Lower chest: Unremarkable Hepatobiliary: Two biliary stents remain in place dilated and thick-walled right and left hepatic ducts with associated surrounding hypoenhancement similar to the 10/15/2022 exam. Small amount of pneumobilia. The overall degree of peripheral intrahepatic biliary  dilatation is minimally increased from prior. 6 mm left hepatic lobe cyst on image 7 series 2 unchanged. Pancreas: Mild dilation of the dorsal pancreatic duct in the pancreatic head similar to prior. Spleen: Unremarkable Adrenals/Urinary Tract: Stable appearance of bilateral nonobstructive renal calculi. Mild scarring in portions of the right kidney. Both adrenal glands appear normal. No hydronephrosis or hydroureter. Urinary bladder relatively empty but otherwise unremarkable. Stomach/Bowel: Substantial circumferential wall thickening in the rectum, residual tumor not excluded. No current dilated bowel. Normal appendix. Vascular/Lymphatic: Mild atheromatous vascular calcification of the common iliac arteries. Reproductive: Poor definition of fat planes between the uterus on the thick-walled rectum. Other: Enlarging tumor studding along the left paracolic gutter, with a nodule for example measuring 1.6 by 1.3 cm on image 34 series 2 and previously measuring 0.9 by 0.8 cm. Additional tumor studding along the left paracolic gutter is worsened. Mild change in orientation of the omentum complicates comparison of the omental nodularity. Most of this nodularity is fairly stable although components such as the 1.2 by 1.1 cm omental soft tissue nodule on image 46 of series 2 appear increased. Musculoskeletal: Moderate to prominent left and moderate right degenerative hip arthropathy. Levoconvex lumbar scoliosis. 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 IMPRESSION: 1. Worsening tumor studding along the left paracolic gutter. 2. Mildly worsening  omental nodularity. 3. Substantial circumferential wall thickening in the rectum, residual tumor not excluded. Poor definition of fat planes between the uterus and the thick-walled rectum. 4. Two biliary stents remain in place with dilated and thick-walled right and left hepatic ducts with associated surrounding hypoenhancement similar to  the 10/15/2022 exam. The overall degree of peripheral intrahepatic biliary dilatation is minimally increased from prior. 5. Stable appearance of bilateral nonobstructive renal calculi. 6. 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 7. Moderate to prominent left and moderate right degenerative hip arthropathy. 8. Levoconvex lumbar scoliosis. 9. Mild atheromatous vascular calcification of the common iliac arteries. Electronically Signed   By: Gaylyn Rong M.D.   On: 11/19/2022 20:00   DG Abd 2 Views  Result Date: 11/19/2022 CLINICAL DATA:  Abdominal pain.  History of metastatic colon cancer EXAM: ABDOMEN - 2 VIEW COMPARISON:  06/23/2022; CT abdomen pelvis-06/25/2022 FINDINGS: Paucity of bowel gas without definitive evidence of enteric obstruction. No pneumoperitoneum, pneumatosis or portal venous gas Punctate opacities overlie the bilateral renal fossa compatible with known bilateral nephrolithiasis. Punctate phlebolith overlies the right hemipelvis, unchanged. Biliary stents overlie the right upper abdominal quadrant. Limited visualization of the lower thorax is normal given obliquity and technique Mild scoliotic curvature of the thoracolumbar spine. No definite acute osseous abnormalities. IMPRESSION: 1. Paucity of bowel gas without definitive evidence of enteric obstruction. 2. Bilateral nephrolithiasis. Electronically Signed   By: Simonne Come M.D.   On: 11/19/2022 15:38        Scheduled Meds:  amLODipine  10 mg Oral Daily   chlorthalidone  25 mg Oral Daily   enoxaparin (LOVENOX) injection  40 mg Subcutaneous Q24H   polyethylene glycol  17 g Oral BID   senna-docusate  1 tablet Oral BID   [START ON 11/21/2022] sodium phosphate  1 enema Rectal On Call   Continuous Infusions:   LOS: 0 days    Time spent: 40 minutes    Ramiro Harvest, MD [facility]   To contact the attending provider between 7A-7P or the covering provider during after hours 7P-7A,  please log into the web site [email or web address] and access using universal [place] password for that web site. If you do not have the password, please call the hospital operator.  11/20/2022, 3:40 PM

## 2022-11-20 NOTE — H&P (Incomplete)
History and Physical    April [name]:[id number] DOB: [birth year] DOA: 11/19/2022  PCP: Tollie Eth, NP  Patient coming from: ***  I have personally briefly reviewed patient's old medical records in [facility]  Chief Complaint: ***  HPI: Erikah Thumm is a 47 y.o. 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  ***  [facility] ED Course  Labs/Imaging on admission: I have personally reviewed following labs and imaging studies.  ***  Review of Systems: All systems reviewed and are negative except as documented in history of present illness above.   Past Medical History:  Diagnosis Date  . Benign essential hypertension 10/06/2017   Last Assessment & Plan:  Formatting of this note might be different from the original. Patient's blood pressure noted to be elevated at today's visit 188/98.  Patient will be provided with antihypertensives in the treatment center if needed in order to meet her Avastin parameters.  Patient recommended to follow up with her primary care physician regarding medication management.  Patient is not cur  . Cancer (HCC)   . Colon cancer (HCC)   . Hypertension   . Renal calculi    bilateral    Past Surgical History:  Procedure Laterality Date  . BILIARY BRUSHING  03/24/2022   Procedure: BILIARY BRUSHING;  Surgeon: Meridee Score Netty Starring., MD;  Location: [facility] END[facility]COPY;  Service: Gastroenterology;;  . BILIARY DILATION  03/24/2022   Procedure: BILIARY DILATION;  Surgeon: Lemar Lofty., MD;  Location: [facility] ENDOSCOPY;  Service: Gastroenterology;;  . BILIARY STENT PLACEMENT  03/24/2022   Procedure: BILIARY STENT PLACEMENT;  Surgeon: Lemar Lofty., MD;  Location: [facility]  ENDOSCOPY;  Service: Gastroenterology;;  . BIOPSY  03/24/2022   Procedure: BIOPSY;  Surgeon: Lemar Lofty., MD;  Location: [facility] ENDOSCOPY;  Service: Gastroenterology;;  . CESAREAN SECTION     x2  . CYSTOSCOPY WITH RETROGRADE PYELOGRAM, URETEROSCOPY AND STENT PLACEMENT Right 10/22/2020   Procedure: CYSTOSCOPY WITH RETROGRADE PYELOGRAM, URETEROSCOPY AND STENT PLACEMENT;  Surgeon: Noel Christmas, MD;  Location: [facility] ORS;  Service: Urology;  Laterality: Right;  . ERCP N/A 03/24/2022   Procedure: ENDOSCOPIC RETROGRADE CHOLANGIOPANCREATOGRAPHY (ERCP);  Surgeon: Lemar Lofty., MD;  Location: [facility] ENDOSCOPY;  Service: Gastroenterology;  Laterality: N/A;  . IR RADIOLOGIST EVAL & MGMT  09/16/2019  . IR RADIOLOGIST EVAL & MGMT  09/23/2019  . IR RADIOLOGIST EVAL & MGMT  09/20/2019  . IR RADIOLOGIST EVAL & MGMT  09/30/2019  . IR REMOVAL TUN ACCESS W/ PORT W/O FL MOD SED  09/14/2019  . IR VENO/EXT/UNI RIGHT  04/11/2022  . PANCREATIC STENT PLACEMENT  03/24/2022   Procedure: PANCREATIC STENT PLACEMENT;  Surgeon: Meridee Score Netty Starring., MD;  Location: [facility] ENDOSCOPY;  Service: Gastroenterology;;  . REMOVAL OF STONES  03/24/2022   Procedure: REMOVAL OF STONES;  Surgeon: Lemar Lofty., MD;  Location: [facility] ENDOSCOPY;  Service: Gastroenterology;;  . Dennison Mascot  03/24/2022   Procedure: Dennison Mascot;  Surgeon: Lemar Lofty., MD;  Location: [facility] ENDOSCOPY;  Service: Gastroenterology;;  . URETERAL STENT PLACEMENT      Social History:  reports that she has quit smoking. Her smoking use included cigarettes. She has never used smokeless tobacco. She reports that she does not currently use alcohol. She reports that she does not currently use drugs after having used  the following drugs: Hydrocodone.  Allergies  Allergen Reactions  . Lisinopril Swelling and Other (See Comments)    Lip swelling and Angioedema  . Irinotecan Other (See Comments)    Stomach cramping Patient given 0.5 mL  atropine IV and was able to continue infusion.  See Progress note on 10/03/20.   Marland Kitchen Leucovorin Calcium Itching    Patient reported itchy palms towards end of transfusion, see progress note 08/04/22.   Marland Kitchen Oxaliplatin Itching    Patient reported itchy palms towards end of transfusion, see progress note on 08/04/22.    Family History  Problem Relation Age of Onset  . Hypertension Mother   . Diabetes Mellitus II Father   . Stroke Father   . Ulcers Sister   . Colon cancer Neg Hx   . Esophageal cancer Neg Hx   . Inflammatory bowel disease Neg Hx   . Liver disease Neg Hx   . Pancreatic cancer Neg Hx   . Rectal cancer Neg Hx   . Stomach cancer Neg Hx      Prior to Admission medications   Medication Sig Start Date End Date Taking? Authorizing Provider  acetaminophen (ACETAMINOPHEN 8 HOUR) 650 MG CR tablet Take 650 mg by mouth every 8 (eight) hours as needed for pain.    [provider]  amLODipine (NORVASC) 10 MG tablet Take 1 tablet (10 mg total) by mouth daily. 10/21/22   Tollie Eth, NP  capecitabine (XELODA) 500 MG tablet Take 2 tablets (1,000 mg total) by mouth 2 (two) times daily after a meal. 11/18/22   Ladene Artist, MD  chlorthalidone (HYGROTON) 25 MG tablet Take 1 tablet (25 mg total) by mouth daily. Patient taking differently: Take 25 mg by mouth once a week. 06/02/22   Tollie Eth, NP  dexamethasone (DECADRON) 4 MG tablet Take 1 tablet by mouth twice a day for 3 days beginning day 2 of chemo. Patient not taking: Reported on 11/11/2022 08/04/22   Rana Snare, NP  diclofenac Sodium (VOLTAREN) 1 % GEL Research Patient: Apply 0.5 grams (1 fingertip) to each hand and each foot twice daily for up to 12 weeks Patient not taking: Reported on 11/11/2022 10/27/22   Ladene Artist, MD  HYDROcodone-acetaminophen (NORCO) 10-325 MG tablet Take 1-2 tablets by mouth every 6 (six) hours as needed. 10/20/22   Ladene Artist, MD  Polyethylene Glycol 3350 (MIRALAX PO) Take 17 g by  mouth daily.    [provider]  potassium chloride (MICRO-K) 10 MEQ CR capsule Take 2 capsules (20 mEq total) by mouth 2 (two) times daily. 09/08/22   Ladene Artist, MD  prochlorperazine (COMPAZINE) 10 MG tablet Take 1 tablet (10 mg total) by mouth every 6 (six) hours as needed for nausea or vomiting. Patient not taking: Reported on 11/11/2022 04/28/22   Rana Snare, NP  senna-docusate (SENOKOT-S) 8.6-50 MG tablet Take 1 tablet by mouth 2 (two) times daily. Patient not taking: Reported on 07/25/2022 04/18/22   Glade Lloyd, MD    Physical Exam: Vitals:   11/19/22 1930 11/19/22 2130 11/19/22 2145 11/19/22 2245  BP: (!) 166/122 (!) 198/143 (!) 204/135 (!) 183/134  Pulse: 86 76 73 83  Resp:  16 19 18   Temp:    98.5 F (36.9 C)  TempSrc:    Oral  SpO2: 97% 100% 100% 100%  Weight:      Height:       *** Constitutional: NAD, calm, comfortable Eyes: PERRL, lids and  conjunctivae normal ENMT: Mucous membranes are moist. Posterior pharynx clear of any exudate or lesions.Normal dentition.  Neck: normal, supple, no masses. Respiratory: clear to auscultation bilaterally, no wheezing, no crackles. Normal respiratory effort. No accessory muscle use.  Cardiovascular: Regular rate and rhythm, no murmurs / rubs / gallops. No extremity edema. 2+ pedal pulses. Abdomen: no tenderness, no masses palpated. No hepatosplenomegaly. Bowel sounds positive.  Musculoskeletal: no clubbing / cyanosis. No joint deformity upper and lower extremities. Good ROM, no contractures. Normal muscle tone.  Skin: no rashes, lesions, ulcers. No induration Neurologic: CN 2-12 grossly intact. Sensation intact. Strength 5/5 in all 4.  Psychiatric: Normal judgment and insight. Alert and oriented x 3. Normal mood.   EKG: Personally reviewed. Sinus rhythm, rate 87, no acute ischemic changes.  Similar to previous.  Assessment/Plan Principal Problem:   Cancer related pain   *** No notes on file *** Assessment and  Plan: No notes have been filed under this hospital service. Service: Hospitalist  Stage IV colon cancer with omental/peritoneal involvement Uncontrolled cancer associated pain: ***  Hypertension: ***  Hypokalemia: ***    DVT prophylaxis: ***  Code Status: ***  Family Communication: ***  Disposition Plan: ***  Consults called: ***  Severity of Illness: {Observation/Inpatient:21159}  Darreld Mclean MD [facility]  If 7PM-7AM, please contact night-coverage [email or web address]  11/19/2022, 11:09 PM

## 2022-11-20 NOTE — Consult Note (Addendum)
Consultation  Referring Provider:   [facility] Dr. Janee Morn Primary Care Physician:  Early, Sung Amabile, NP Primary Gastroenterologist:  Dr. Meridee Score       Reason for Consultation:    rectal pain/AB pain/change in bowel habits in patient with metastatic colon cancer  DOA: 11/19/2022         Hospital Day: 2         HPI:   April [name] is a 47 y.o. 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 Meridee Score.  06/24/2022 patient saw Dr. Meridee Score in the office with change in bowel habits, worsening constipation.  She not had colonoscopy in years and was set up for colonoscopy further evaluation however she was not feeling well with systemic chemotherapy so was canceled.  Presents to the ER with 2 weeks of abdominal pain, worse with bowel movements, constipation not responsive to laxatives, rectal pain, abdominal pain and nausea vomiting. Stable vitals  Work up notable for: CT abdomen pelvis with contrast showed worsening tumor studding along the left paracolic gutter mildly worsening omental nodularity substantial circumferential wall thickening in the rectum noted, residual tumor not excluded.  2 biliary stents remain in place with dilated and thick-walled right and left hepatic ducts with associated surrounding hyperenhancement similar to the 10/15/2022 exam. WBC 7.5, Hgb 14.5, platelets 285, potassium 3.1, bicarb 28, BUN 7, creatinine 0.57, lipase 37  No family was present at the time of my evaluation. Patient states for at least 6 months she has had worsening rectal and abdominal discomfort feels like a pressure.  States she has had decreased bowel movements having 1 bowel movement a week very small volume brown hard  balls with no melena or hematochezia. States discomforts is a lower abdominal and rectal pressure where she feels she is unable to let out gas or have a bowel movement unless she is standing or lying flat on her bed.  Patient has been on MiraLAX for the last 2 weeks and did have 2 large soft bowel movements last 1 was yesterday, states this did not improve the rectal or abdominal pressure.  She has been having to take hydrocodone daily for abdominal discomfort. Patient is lost about 3 pounds in the last several weeks, has nausea occasional bilious vomiting.  Not on Protonix denies any reflux.  Denies any yellowing of her eyes or skin, dark urine.  GI Procedures and Studies  Colonoscopy 03/24/2019 -3 rectal polyps-hyperplastic polyps, distal colon biopsy-at least intramucosal adenocarcinoma, completely obstructing mass in the distal sigmoid colon, could not be traversed, intact mismatch repair protein expression  3/11 ERCP - LA Grade C esophagitis with no bleeding. - 2 cm hiatal hernia. - Erythematous mucosa in the stomach. Biopsied for HP evaluation. - No gross lesions in the duodenal bulb, in the first portion of the duodenum and in the second portion of the duodenum. - The major papilla appeared normal. - A biliary sphincterotomy was performed. 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 This stricturing was malignant appearing. The stricturing was dilated to 4 mm to allow for tool passage. - The left and right hepatic ducts and all intrahepatic branches were moderately dilated, secondary to stricturing. - Cells for cytology obtained from the LHD/CHD and RHD/CHD stricture. -  One temporary plastic pancreatic stent was placed into the ventral pancreatic duct to decrease PEP Abnormal ED labs: Abnormal Labs Reviewed  COMPREHENSIVE METABOLIC PANEL - Abnormal; Notable for the following components:      Result Value   Potassium  3.1 (*)    Calcium 10.6 (*)    Total Protein 8.6 (*)    Alkaline Phosphatase 159 (*)    All other components within normal limits  CBC WITH DIFFERENTIAL/PLATELET - Abnormal; Notable for the following components:   Eosinophils Absolute 0.6 (*)    All other components within normal limits  BASIC METABOLIC PANEL - Abnormal; Notable for the following components:   Potassium 3.1 (*)    All other components within normal limits    Past Medical History:  Diagnosis Date   Benign essential hypertension 10/06/2017   Last Assessment & Plan:  Formatting of this note might be different from the original. Patient's blood pressure noted to be elevated at today's visit 188/98.  Patient will be provided with antihypertensives in the treatment center if needed in order to meet her Avastin parameters.  Patient recommended to follow up with her primary care physician regarding medication management.  Patient is not cur   Cancer [facility])    Colon cancer (HCC)    Hypertension    Renal calculi    bilateral    Surgical History:  She  has a past surgical history that includes Cesarean section; Ureteral stent placement; IR REMOVAL TUN ACCESS W/ PORT W/O FL MOD SED (09/14/2019); IR Radiologist Eval & Mgmt (09/16/2019); IR Radiologist Eval & Mgmt (09/23/2019); IR Radiologist Eval & Mgmt (09/20/2019); IR Radiologist Eval & Mgmt (09/30/2019); Cystoscopy with retrograde pyelogram, ureteroscopy and stent placement (Right, 10/22/2020); ERCP (N/A, 03/24/2022); sphincterotomy (03/24/2022); pancreatic stent placement (03/24/2022); Biliary brushing (03/24/2022); removal of stones (03/24/2022); Biliary dilation (03/24/2022); biliary stent placement (03/24/2022); biopsy (03/24/2022); and IR Veno/Ext/Uni Right (04/11/2022). Family History:  Her family history includes Diabetes Mellitus II in her father; Hypertension in her mother; Stroke in her father; Ulcers in her sister. Social History:   reports that she has quit smoking. Her smoking use  included cigarettes. She has never used smokeless tobacco. She reports that she does not currently use alcohol. She reports that she does not currently use drugs after having used the following drugs: Hydrocodone.  Prior to Admission medications   Medication Sig Start Date End Date Taking? Authorizing Provider  acetaminophen (ACETAMINOPHEN 8 HOUR) 650 MG CR tablet Take 650 mg by mouth every 8 (eight) hours as needed for pain.   Yes [provider]  amLODipine (NORVASC) 10 MG tablet Take 1 tablet (10 mg total) by mouth daily. 10/21/22  Yes Early, Sung Amabile, NP  capecitabine (XELODA) 500 MG tablet Take 2 tablets (1,000 mg total) by mouth 2 (two) times daily after a meal. 11/18/22  Yes Ladene Artist, MD  chlorthalidone (HYGROTON) 25 MG tablet Take 1 tablet (25 mg total) by mouth daily. Patient taking differently: Take 25 mg by mouth once a week. 06/02/22  Yes Early, Sung Amabile, NP  HYDROcodone-acetaminophen (NORCO) 10-325 MG tablet Take 1-2 tablets by mouth every 6 (six) hours as needed. 10/20/22  Yes Ladene Artist, MD  Polyethylene Glycol 3350 (MIRALAX PO) Take 17 g by mouth daily.   Yes [provider]  potassium chloride (MICRO-K) 10 MEQ CR capsule Take 2 capsules (20 mEq total) by mouth 2 (two) times daily. 09/08/22  Yes Ladene Artist, MD  prochlorperazine (COMPAZINE) 10 MG tablet Take  1 tablet (10 mg total) by mouth every 6 (six) hours as needed for nausea or vomiting. 04/28/22  Yes Rana Snare, NP  dexamethasone (DECADRON) 4 MG tablet Take 1 tablet by mouth twice a day for 3 days beginning day 2 of chemo. Patient not taking: Reported on 11/11/2022 08/04/22   Rana Snare, NP  diclofenac Sodium (VOLTAREN) 1 % GEL Research Patient: Apply 0.5 grams (1 fingertip) to each hand and each foot twice daily for up to 12 weeks Patient not taking: Reported on 11/11/2022 10/27/22   Ladene Artist, MD  senna-docusate (SENOKOT-S) 8.6-50 MG tablet Take 1 tablet by mouth 2 (two) times  daily. Patient not taking: Reported on 07/25/2022 04/18/22   Glade Lloyd, MD    Current Facility-Administered Medications  Medication Dose Route Frequency Provider Last Rate Last Admin   acetaminophen (TYLENOL) tablet 650 mg  650 mg Oral Q6H PRN Charlsie Quest, MD       Or   acetaminophen (TYLENOL) suppository 650 mg  650 mg Rectal Q6H PRN Charlsie Quest, MD       amLODipine (NORVASC) tablet 10 mg  10 mg Oral Daily Charlsie Quest, MD   10 mg at 11/20/22 0901   bisacodyl (DULCOLAX) EC tablet 5 mg  5 mg Oral Daily PRN Charlsie Quest, MD       chlorthalidone (HYGROTON) tablet 25 mg  25 mg Oral Daily Rodolph Bong, MD   25 mg at 11/20/22 1102   enoxaparin (LOVENOX) injection 40 mg  40 mg Subcutaneous Q24H Darreld Mclean R, MD       hydrALAZINE (APRESOLINE) injection 10 mg  10 mg Intravenous Q4H PRN Charlsie Quest, MD   10 mg at 11/20/22 0103   HYDROcodone-acetaminophen (NORCO) 10-325 MG per tablet 1-2 tablet  1-2 tablet Oral Q6H PRN Charlsie Quest, MD   2 tablet at 11/20/22 0901   morphine (PF) 2 MG/ML injection 2 mg  2 mg Intravenous Q3H PRN Charlsie Quest, MD       polyethylene glycol (MIRALAX / GLYCOLAX) packet 17 g  17 g Oral BID Rodolph Bong, MD       potassium chloride SA (KLOR-CON M) CR tablet 40 mEq  40 mEq Oral Q4H Rodolph Bong, MD   40 mEq at 11/20/22 1102   prochlorperazine (COMPAZINE) injection 10 mg  10 mg Intravenous Q6H PRN Charlsie Quest, MD   10 mg at 11/20/22 0225   senna-docusate (Senokot-S) tablet 1 tablet  1 tablet Oral QHS PRN Charlsie Quest, MD       senna-docusate (Senokot-S) tablet 1 tablet  1 tablet Oral BID Rodolph Bong, MD   1 tablet at 11/20/22 1100    Allergies as of 11/19/2022 - Review Complete 11/19/2022  Allergen Reaction Noted   Lisinopril Swelling and Other (See Comments) 07/21/2019   Irinotecan Other (See Comments) 10/03/2020   Leucovorin calcium Itching 08/04/2022   Oxaliplatin Itching 08/04/2022    Review of Systems:     Constitutional: No weight loss, fever, chills, weakness or fatigue HEENT: Eyes: No change in vision               Ears, Nose, Throat:  No change in hearing or congestion Skin: No rash or itching Cardiovascular: No chest pain, chest pressure or palpitations   Respiratory: No SOB or cough Gastrointestinal: See HPI and otherwise negative Genitourinary: No dysuria or change in urinary frequency Neurological: No headache, dizziness or syncope Musculoskeletal:  No new muscle or joint pain Hematologic: No bleeding or bruising Psychiatric: No history of depression or anxiety     Physical Exam:  Vital signs in last 24 hours: Temp:  [98.2 F (36.8 C)-98.7 F (37.1 C)] 98.7 F (37.1 C) (11/07 0437) Pulse Rate:  [73-92] 88 (11/07 0437) Resp:  [16-24] 18 (11/07 0437) BP: (142-204)/(89-143) 158/138 (11/07 0901) SpO2:  [97 %-100 %] 100 % (11/07 0437) Weight:  [64.9 kg] 64.9 kg (11/06 1820) Last BM Date : 11/19/22 Last BM recorded by nurses in past 5 days No data recorded  General:   Pleasant, well developed female in no acute distress Head:  Normocephalic and atraumatic. Eyes: sclerae anicteric,conjunctive pink  Heart:  regular rate and rhythm, no murmurs or gallops Pulm: Clear anteriorly; no wheezing Abdomen:  Soft, Obese AB, Active bowel sounds. mild tenderness in the lower abdomen/suprapubic. Without guarding and Without rebound, No organomegaly appreciated. Rectal exam: declines Extremities:  Without edema. Msk:  Symmetrical without gross deformities. Peripheral pulses intact.  Neurologic:  Alert and  oriented x4;  No focal deficits.  Skin:   Dry and intact without significant lesions or rashes. Psychiatric:  Cooperative. Normal mood and affect.  LAB RESULTS: Recent Labs    11/19/22 1619 11/20/22 0550  WBC 7.5 6.7  HGB 14.5 13.0  HCT 43.0 38.4  PLT 285 242   BMET Recent Labs    11/19/22 1619 11/20/22 0550  NA 139 137  K 3.1* 3.1*  CL 98 100  CO2 28 25  GLUCOSE 86 88   BUN 7 7  CREATININE 0.57 0.50  CALCIUM 10.6* 9.1   LFT Recent Labs    11/19/22 1619  PROT 8.6*  ALBUMIN 4.4  AST 22  ALT 13  ALKPHOS 159*  BILITOT 0.4   PT/INR No results for input(s): "LABPROT", "INR" in the last 72 hours.  STUDIES: CT ABDOMEN PELVIS W CONTRAST  Result Date: 11/19/2022 CLINICAL DATA:  Colon cancer, recent chemotherapy. Abdominal pain with low back pain and pelvic pain. * Tracking Code: BO * EXAM: CT ABDOMEN AND PELVIS WITH CONTRAST TECHNIQUE: Multidetector CT imaging of the abdomen and pelvis was performed using the standard protocol following bolus administration of intravenous contrast. RADIATION DOSE REDUCTION: This exam was performed according to the departmental dose-optimization program which includes automated exposure control, adjustment of the mA and/or kV according to patient size and/or use of iterative reconstruction technique. CONTRAST:  80mL OMNIPAQUE IOHEXOL 300 MG/ML  SOLN COMPARISON:  10/15/2022 CT scan FINDINGS: Lower chest: Unremarkable Hepatobiliary: Two biliary stents remain in place dilated and thick-walled right and left hepatic ducts with associated surrounding hypoenhancement similar to the 10/15/2022 exam. Small amount of pneumobilia. The overall degree of peripheral intrahepatic biliary dilatation is minimally increased from prior. 6 mm left hepatic lobe cyst on image 7 series 2 unchanged. Pancreas: Mild dilation of the dorsal pancreatic duct in the pancreatic head similar to prior. Spleen: Unremarkable Adrenals/Urinary Tract: Stable appearance of bilateral nonobstructive renal calculi. Mild scarring in portions of the right kidney. Both adrenal glands appear normal. No hydronephrosis or hydroureter. Urinary bladder relatively empty but otherwise unremarkable. Stomach/Bowel: Substantial circumferential wall thickening in the rectum, residual tumor not excluded. No current dilated bowel. Normal appendix. Vascular/Lymphatic: Mild atheromatous vascular  calcification of the common iliac arteries. Reproductive: Poor definition of fat planes between the uterus on the thick-walled rectum. Other: Enlarging tumor studding along the left paracolic gutter, with a nodule for example measuring 1.6 by 1.3 cm on image 34 series 2  and previously measuring 0.9 by 0.8 cm. Additional tumor studding along the left paracolic gutter is worsened. Mild change in orientation of the omentum complicates comparison of the omental nodularity. Most of this nodularity is fairly stable although components such as the 1.2 by 1.1 cm omental soft tissue nodule on image 46 of series 2 appear increased. Musculoskeletal: Moderate to prominent left and moderate right degenerative hip arthropathy. Levoconvex lumbar scoliosis. 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 IMPRESSION: 1. Worsening tumor studding along the left paracolic gutter. 2. Mildly worsening omental nodularity. 3. Substantial circumferential wall thickening in the rectum, residual tumor not excluded. Poor definition of fat planes between the uterus and the thick-walled rectum. 4. Two biliary stents remain in place with dilated and thick-walled right and left hepatic ducts with associated surrounding hypoenhancement similar to the 10/15/2022 exam. The overall degree of peripheral intrahepatic biliary dilatation is minimally increased from prior. 5. Stable appearance of bilateral nonobstructive renal calculi. 6. 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 7. Moderate to prominent left and moderate right degenerative hip arthropathy. 8. Levoconvex lumbar scoliosis. 9. Mild atheromatous vascular calcification of the common iliac arteries. Electronically Signed   By: Gaylyn Rong M.D.   On: 11/19/2022 20:00   DG Abd 2 Views  Result Date: 11/19/2022 CLINICAL DATA:  Abdominal pain.  History of metastatic colon cancer EXAM: ABDOMEN - 2 VIEW COMPARISON:   06/23/2022; CT abdomen pelvis-06/25/2022 FINDINGS: Paucity of bowel gas without definitive evidence of enteric obstruction. No pneumoperitoneum, pneumatosis or portal venous gas Punctate opacities overlie the bilateral renal fossa compatible with known bilateral nephrolithiasis. Punctate phlebolith overlies the right hemipelvis, unchanged. Biliary stents overlie the right upper abdominal quadrant. Limited visualization of the lower thorax is normal given obliquity and technique Mild scoliotic curvature of the thoracolumbar spine. No definite acute osseous abnormalities. IMPRESSION: 1. Paucity of bowel gas without definitive evidence of enteric obstruction. 2. Bilateral nephrolithiasis. Electronically Signed   By: Simonne Come M.D.   On: 11/19/2022 15:38      Impression    Stage IV metastatic colon cancer with omental involvement Presents with abdominal pain, rectal pain and worsening constipation Status post left anterior resection 2021, declines rectal exam today. Has been following with Dr. Truett Perna outpatient with multiple rounds of chemotherapy, currently on maintenance chemotherapy Xeloda, last dose about a month ago. Rectal pain and constipation potentially related to cancer pain, possible pelvic floor from previous LAR, however CT does show circumferential wall thickening unknown if potential tumor  Malignant biliary stricture AST 22 ALT 13  Alkphos 159 TBili 0.4 Stable liver function Status post ERCP 3/11 status post sphincterectomy, pancreatic stent placed CT today shows temporary plastic pancreatic stents Patient has follow-up with Dr. Meridee Score in December for repeat ERCP for stent removal  Principal Problem:   Cancer related pain Active Problems:   Hypertension   Hypokalemia   Colorectal cancer, stage IV (HCC)    LOS: 0 days     Plan   Will schedule for flex sigmoidoscopy tomorrow with Dr. Marina Goodell Had senokot this AM and getting another dose tonight enema 1-2 hours prior.   Can take dicyclomine as needed, suggest giving prior to enemas Continue miralax twice a day NPO at midnight We have discussed the risks of bleeding, infection, perforation, medication reactions, and remote risk of death associated with Flex sigmoidoscopy. All questions were answered and the patient acknowledges these risk and wishes  to proceed.  Thank you for your kind consultation, we will continue to follow.   Doree Albee  11/20/2022, 1:52 PM  GI ATTENDING  History, laboratories, x-rays, prior operative report, endoscopy reports reviewed.  Patient seen and examined as outlined above.  Agree with comprehensive consultation note.  Asked to evaluate this patient with metastatic colon cancer including carcinomatosis who presents with abdominal pain and obstipation.  Has been on pain medicine.  CT raise the question of distal colon cancer.  Plan on colonoscopy tomorrow to evaluate.  This after bowel purge.  Thanks.  Wilhemina Bonito. Eda Keys., M.D. [facility] Division of Gastroenterology

## 2022-11-20 NOTE — Care Management Obs Status (Signed)
MEDICARE OBSERVATION STATUS NOTIFICATION   Patient Details  Name: April [name] MRN: [id number] Date of Birth: [birth year]   Medicare Observation Status Notification Given:  Yes    Coralyn Helling, LCSW 11/20/2022, 10:52 AM

## 2022-11-20 NOTE — TOC Initial Note (Signed)
Transition of Care [facility]) - Initial/Assessment Note    Patient Details  Name: April [name] MRN: [id number] Date of Birth: [birth year]  Transition of Care [facility]) CM/SW Contact:    Coralyn Helling, LCSW Phone Number: 11/20/2022, 10:55 AM  Clinical Narrative:  Patient screened for SDOH risk. Patient reports that she has food at home, but when she is on Chemo she does not have the energy to make food and her children are not always home to cook. Patient reports she does keep snack close by. Patient is able to get to appointments, but reports trouble getting to pharmacy and grocery store. Patient also reports a high energy bill that she is trying to pay off. TOC and patient discussed resources and tried to  find solutions to needs.                   Expected Discharge Plan: Home/Self Care Barriers to Discharge: Continued Medical Work up   Patient Goals and CMS Choice Patient states their goals for this hospitalization and ongoing recovery are:: Go home   Choice offered to / list presented to : NA      Expected Discharge Plan and Services In-house Referral: NA Discharge Planning Services: NA   Living arrangements for the past 2 months: Single Family Home                 DME Arranged: N/A DME Agency: NA       HH Arranged: NA HH Agency: NA        Prior Living Arrangements/Services Living arrangements for the past 2 months: Single Family Home Lives with:: Adult Children, Minor Children Patient language and need for interpreter reviewed:: Yes Do you feel safe going back to the place where you live?: Yes      Need for Family Participation in Patient Care: Yes (Comment) Care giver support system in place?: Yes (comment)   Criminal Activity/Legal Involvement Pertinent to Current Situation/Hospitalization: No - Comment as needed  Activities of Daily Living   ADL Screening (condition at time of admission) Independently performs ADLs?: Yes (appropriate for developmental  age) Is the patient deaf or have difficulty hearing?: No Does the patient have difficulty seeing, even when wearing glasses/contacts?: No Does the patient have difficulty concentrating, remembering, or making decisions?: No  Permission Sought/Granted   Permission granted to share information with : No              Emotional Assessment Appearance:: Appears stated age Attitude/Demeanor/Rapport: Engaged Affect (typically observed): Accepting Orientation: : Oriented to Self, Oriented to Place, Oriented to  Time, Oriented to Situation Alcohol / Substance Use: Not Applicable Psych Involvement: No (comment)  Admission diagnosis:  Cancer associated pain [G89.3] Cancer related pain [G89.3] Patient Active Problem List   Diagnosis Date Noted   Cancer related pain 11/19/2022   Acute nonintractable headache 08/11/2022   Nausea and vomiting 07/24/2022   History of colon cancer 06/28/2022   History of ERCP 06/28/2022   Change in bowel habits 06/28/2022   Former smoker 04/30/2022   Intractable nausea and vomiting 04/16/2022   Protein-calorie malnutrition, severe 03/26/2022   Chronic constipation 03/26/2022   Metastatic colon cancer to liver (HCC) 03/24/2022   Obstructive jaundice 03/24/2022   Biliary stricture 03/24/2022   Biliary obstruction 03/23/2022   Hyperbilirubinemia 03/21/2022   Abnormal LFTs 03/21/2022   Nephrolithiasis 03/21/2022   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   Renal calculi    Hydronephrosis of right kidney  Colorectal cancer, stage IV (HCC) 10/15/2020   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   Port-A-Cath in place 11/22/2019   High risk medication use 06/07/2019   Adnexal mass 04/09/2019   Rectal cancer (HCC) 04/08/2019   Other constipation 03/14/2019   Hypertension 01/07/2018   Sinus tachycardia 01/07/2018   Hypokalemia 01/07/2018   Prolonged QT interval 01/07/2018    Hyperglycemia 01/07/2018   Tobacco use 01/07/2018   PCP:  Tollie Eth, NP Pharmacy:   [facility] [phone] - [place], [place] - [address] AT [facility] [address] [place] [address] Phone: [phone] Fax: [phone]  [facility] [address] [place] [address] Phone: [phone] Fax: [phone]  [facility] [address] [place] [address] Phone: [phone] Fax: [phone]     Social Determinants of Health (SDOH) Social History: SDOH Screenings   Food Insecurity: No Food Insecurity (11/19/2022)  Recent Concern: Food Insecurity - Food Insecurity Present (11/11/2022)  Housing: Low Risk  (11/19/2022)  Transportation Needs: No Transportation Needs (11/19/2022)  Recent Concern: Transportation Needs - Unmet Transportation Needs (11/11/2022)  Utilities: Not At Risk (11/19/2022)  Alcohol Screen: Low Risk  (11/11/2022)  Depression (PHQ2-9): Low Risk  (11/11/2022)  Financial Resource Strain: Low Risk  (11/11/2022)  Physical Activity: Inactive (11/11/2022)  Social Connections: Socially Isolated (11/11/2022)  Stress: No Stress Concern Present (11/11/2022)  Tobacco Use: Medium Risk (11/19/2022)  Health Literacy: Adequate Health Literacy (11/11/2022)   SDOH Interventions:     Readmission Risk Interventions     No data to display

## 2022-11-20 NOTE — Hospital Course (Signed)
April [name] is a 47 y.o. 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

## 2022-11-20 NOTE — Plan of Care (Signed)
  Problem: Education: Goal: Knowledge of General Education information will improve Description: Including pain rating scale, medication(s)/side effects and non-pharmacologic comfort measures Outcome: Progressing   Problem: Clinical Measurements: Goal: Ability to maintain clinical measurements within normal limits will improve Outcome: Progressing Goal: Will remain free from infection Outcome: Progressing Goal: Diagnostic test results will improve Outcome: Progressing Goal: Respiratory complications will improve Outcome: Progressing Goal: Cardiovascular complication will be avoided Outcome: Progressing   Problem: Activity: Goal: Risk for activity intolerance will decrease Outcome: Progressing   Problem: Nutrition: Goal: Adequate nutrition will be maintained Outcome: Progressing   Problem: Pain Management: Goal: General experience of comfort will improve Outcome: Progressing   Problem: Elimination: Goal: Will not experience complications related to bowel motility Outcome: Progressing Goal: Will not experience complications related to urinary retention Outcome: Progressing

## 2022-11-20 NOTE — Progress Notes (Signed)
IP PROGRESS NOTE  Subjective:   April [name] is well-known to me with a history of metastatic colon cancer.  She is currently being treated with capecitabine maintenance. She reports constipation for the past several weeks.  She has cramping lower abdominal and sacral pain prior to and following bowel movements.  The bowel movements are thin. She presented to the office yesterday increased abdominal pain and nausea/vomiting.  She was referred to the emergency room for further evaluation.  She had multiple bowel movements yesterday and feels better, though she continues to have abdominal cramping following bowel movements.  Objective: Vital signs in last 24 hours: Blood pressure (!) 161/121, pulse 88, temperature 98.7 F (37.1 C), temperature source Oral, resp. rate 18, height 5\' 4"  (1.626 m), weight 143 lb (64.9 kg), last menstrual period 01/07/2006, SpO2 100%.  Intake/Output from previous day: 11/06 0701 - 11/07 0700 In: 750 [P.O.:250; IV Piggyback:500] Out: -   Physical Exam:  HEENT: No thrush or ulcers Lungs: Lungs clear bilaterally Cardiac: Regular rate and rhythm Abdomen: Soft and nontender, no mass, no hepatosplenomegaly Extremities: No leg edema Skin: Palms without erythema  Portacath/PICC-without erythema  Lab Results: Recent Labs    11/19/22 1619 11/20/22 0550  WBC 7.5 6.7  HGB 14.5 13.0  HCT 43.0 38.4  PLT 285 242    BMET Recent Labs    11/19/22 1619 11/20/22 0550  NA 139 137  K 3.1* 3.1*  CL 98 100  CO2 28 25  GLUCOSE 86 88  BUN 7 7  CREATININE 0.57 0.50  CALCIUM 10.6* 9.1    Lab Results  Component Value Date   CEA1 <0.6 03/23/2022   CEA 3.47 11/13/2022   [id number] 213 (H) 03/23/2022    Studies/Results: CT ABDOMEN PELVIS W CONTRAST  Result Date: 11/19/2022 CLINICAL DATA:  Colon cancer, recent chemotherapy. Abdominal pain with low back pain and pelvic pain. * Tracking Code: BO * EXAM: CT ABDOMEN AND PELVIS WITH CONTRAST TECHNIQUE:  Multidetector CT imaging of the abdomen and pelvis was performed using the standard protocol following bolus administration of intravenous contrast. RADIATION DOSE REDUCTION: This exam was performed according to the departmental dose-optimization program which includes automated exposure control, adjustment of the mA and/or kV according to patient size and/or use of iterative reconstruction technique. CONTRAST:  80mL OMNIPAQUE IOHEXOL 300 MG/ML  SOLN COMPARISON:  10/15/2022 CT scan FINDINGS: Lower chest: Unremarkable Hepatobiliary: Two biliary stents remain in place dilated and thick-walled right and left hepatic ducts with associated surrounding hypoenhancement similar to the 10/15/2022 exam. Small amount of pneumobilia. The overall degree of peripheral intrahepatic biliary dilatation is minimally increased from prior. 6 mm left hepatic lobe cyst on image 7 series 2 unchanged. Pancreas: Mild dilation of the dorsal pancreatic duct in the pancreatic head similar to prior. Spleen: Unremarkable Adrenals/Urinary Tract: Stable appearance of bilateral nonobstructive renal calculi. Mild scarring in portions of the right kidney. Both adrenal glands appear normal. No hydronephrosis or hydroureter. Urinary bladder relatively empty but otherwise unremarkable. Stomach/Bowel: Substantial circumferential wall thickening in the rectum, residual tumor not excluded. No current dilated bowel. Normal appendix. Vascular/Lymphatic: Mild atheromatous vascular calcification of the common iliac arteries. Reproductive: Poor definition of fat planes between the uterus on the thick-walled rectum. Other: Enlarging tumor studding along the left paracolic gutter, with a nodule for example measuring 1.6 by 1.3 cm on image 34 series 2 and previously measuring 0.9 by 0.8 cm. Additional tumor studding along the left paracolic gutter is worsened. Mild change in orientation of the  omentum complicates comparison of the omental nodularity. Most of this  nodularity is fairly stable although components such as the 1.2 by 1.1 cm omental soft tissue nodule on image 46 of series 2 appear increased. Musculoskeletal: Moderate to prominent left and moderate right degenerative hip arthropathy. Levoconvex lumbar scoliosis. 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 IMPRESSION: 1. Worsening tumor studding along the left paracolic gutter. 2. Mildly worsening omental nodularity. 3. Substantial circumferential wall thickening in the rectum, residual tumor not excluded. Poor definition of fat planes between the uterus and the thick-walled rectum. 4. Two biliary stents remain in place with dilated and thick-walled right and left hepatic ducts with associated surrounding hypoenhancement similar to the 10/15/2022 exam. The overall degree of peripheral intrahepatic biliary dilatation is minimally increased from prior. 5. Stable appearance of bilateral nonobstructive renal calculi. 6. 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 7. Moderate to prominent left and moderate right degenerative hip arthropathy. 8. Levoconvex lumbar scoliosis. 9. Mild atheromatous vascular calcification of the common iliac arteries. Electronically Signed   By: Gaylyn Rong M.D.   On: 11/19/2022 20:00   DG Abd 2 Views  Result Date: 11/19/2022 CLINICAL DATA:  Abdominal pain.  History of metastatic colon cancer EXAM: ABDOMEN - 2 VIEW COMPARISON:  06/23/2022; CT abdomen pelvis-06/25/2022 FINDINGS: Paucity of bowel gas without definitive evidence of enteric obstruction. No pneumoperitoneum, pneumatosis or portal venous gas Punctate opacities overlie the bilateral renal fossa compatible with known bilateral nephrolithiasis. Punctate phlebolith overlies the right hemipelvis, unchanged. Biliary stents overlie the right upper abdominal quadrant. Limited visualization of the lower thorax is normal given obliquity and technique Mild  scoliotic curvature of the thoracolumbar spine. No definite acute osseous abnormalities. IMPRESSION: 1. Paucity of bowel gas without definitive evidence of enteric obstruction. 2. Bilateral nephrolithiasis. Electronically Signed   By: Simonne Come M.D.   On: 11/19/2022 15:38    Medications: I have reviewed the patient's current medications.  Assessment/Plan:  Sigmoid colon cancer, stage IV (pT4a,pN2b,M1c) Colonoscopy 03/24/2019-3 rectal polyps-hyperplastic polyps, distal colon biopsy-at least intramucosal adenocarcinoma, completely obstructing mass in the distal sigmoid colon, could not be traversed, intact mismatch repair protein expression 03/24/2019-CEA 56.1 03/29/2019 CT abdomen/pelvis-circumferential thickening involving the entire mid and distal sigmoid colon to the level of the rectosigmoid junction, solid/cystic mass in the left ovary, bilateral nephrolithiasis Robotic assisted low anterior resection, mesenteric lymphadenectomy, bilateral salpingo-oophorectomy 04/08/2019 Pathology ([facility] review of outside pathology) metastatic adenocarcinoma involving the peritoneum overlying the round ligament, serosa of the urinary bladder, serosa of the right ureter a sacral area, pelvic peritoneum, and left ovary.  Omental biopsy with focal mucin pools with no carcinoma cells identified, right hemidiaphragm biopsy involved by metastatic adenocarcinoma, invasive adenocarcinoma the sigmoid colon, moderately differentiated, T4a, perineural and vascular invasion present, 7/22 lymph nodes, multiple tumor deposits, resection margins negative Negative for PD-L1, low probability of MSI-high, HER-2 negative, negative for BRAF, NRAS and KRAS alterations CTs 05/19/2019-no evidence of metastatic disease, findings suspicious for colitis of the transverse and ascending colon, small amount of ascites in the cul-de-sac, bilateral renal calculi Cycle 1 FOLFIRI 05/24/2019, bevacizumab added with cycle 2 Cycle 4 FOLFIRI/bevacizumab  07/05/2019 Cycle 5 FOLFIRI 07/27/2019, bevacizumab held secondary to hypertension Cycle 6 FOLFIRI 08/10/1999, bevacizumab held secondary to hypertension CTs 08/30/2019-no evidence of recurrent disease, new subsolid right lower lobe nodule felt to be inflammatory, emphysema Cycle 7 FOLFIRI 09/26/2019, bevacizumab remains on hold secondary to hypertension Cycle  8 FOLFIRI 10/17/2019, bevacizumab held secondary to hypertension, Udenyca added for neutropenia Cycle 9 FOLFIRI 11/07/2019, bevacizumab held secondary to hypertension, Udenyca Cycle 10 FOLFIRI 11/28/2019, bevacizumab held, Udenyca Cycle 11 FOLFIRI 12/26/2019, bevacizumab held, Udenyca CTs 01/25/2020-no evidence of recurrent disease, resolution of right lower lobe nodule Maintenance Xeloda beginning 02/06/2020 CTs 04/25/2020- no evidence of metastatic disease, multiple bilateral renal calculi without hydronephrosis Maintenance Xeloda continued CT abdomen/pelvis without contrast 08/10/2020-bilateral staghorn renal calculi, no evidence of metastatic disease; addendum 08/23/2020-small but increasing omental nodules. Cycle 1 FOLFIRI/panitumumab 09/19/2020 Cycle 2 FOLFIRI/Panitumumab 10/03/2020 Cycle 3 FOLFIRI/Panitumumab 10/17/2020 Cycle 4 FOLFIRI/panitumumab 10/31/2020 Cycle 5 FOLFIRI/Panitumumab 11/14/2020 CTs 11/26/2020-stable omental metastases compared to 10/22/2020, mildly decreased from 08/10/2020.  Left lower quadrant tiny paracolic gutter implant slightly decreased from CT 08/10/2020.  No new or progressive metastatic disease in the abdomen or pelvis. Cycle 6 FOLFIRI/Panitumumab 11/28/2020, irinotecan dose reduced, treatment schedule adjusted to every 3 weeks going forward Cycle 7 FOLFIRI/panitumumab 12/24/2020 Cycle 8 FOLFIRI/Panitumumab 01/16/2021--treatment held due to hypertension in the infusion area. Cycle 8 FOLFIRI/panitumumab 01/21/2021 Cycle 9 FOLFIRI/panitumumab 02/11/2021 Cycle 10 FOLFIRI/Panitumumab 03/04/2021 Cycle 11 FOLFIRI/Panitumumab  03/25/2021 CT abdomen/pelvis 04/10/2021-stable right omental and left posterior paracolic gutter nodules Cycle 12 FOLFIRI/panitumumab 04/15/2021 Cycle 13 FOLFIRI/panitumumab 05/06/2021 Cycle 14 FOLFIRI/Panitumumab 05/27/2021 Cycle 15 FOLFIRI/Panitumumab 06/17/2021 CT abdomen/pelvis 07/05/2021-slight enlargement of a dominant right omental implant, no new implants Patient requested a treatment break CT abdomen/pelvis 10/04/2021-mild increase in size of multiple omental soft tissue nodules, 4 mm calculus in the distal left ureter adjacent to the ureteral stent Cycle 1 Lonsurf 10/14/2021 10/28/2021 Avastin every 2 weeks Cycle 2 Lonsurf 11/11/2021 Cycle 3 Lonsurf 12/09/2021 Cycle 4 Lonsurf 01/06/2022 Avastin held 01/20/2022 due to proteinuria, 24-hour urine 43 mg CT/pelvis 01/30/2022-possible new peritoneal implant at the transverse colon, no significant change in other omental/peritoneal implants, stable chronic rectal wall thickening, status post removal of double-J left ureteral stent with no evidence of hydronephrosis or ureteral calculus, nonobstructing bilateral renal calculi Cycle 5 Lonsurf 02/05/2022 or 02/06/2022 Avastin every 2 weeks Cycle 6 Lonsurf 03/03/2022 CTs 04/05/2022-increase in size and number of peritoneal implants compared to January 2024 central liver mass narrowing the anterior branch of the right portal vein, decreased biliary duct dilation, biliary stents in place, stable right cardiophrenic lymph nodes, separate pigtail stent in the lumen of the proximal duodenum Cycle 1 FOLFOX 04/14/2022 CT abdomen/pelvis 04/16/2022-stent within the third portion of the duodenum, stable peritoneal nodules, indwelling biliary stents with mild left biliary duct dilation Cycle 2 FOLFOX 04/28/2022, Emend and prophylactic dexamethasone added Cycle 3 FOLFOX 05/12/2022, Aloxi, Emend, prophylactic dexamethasone, Compazine and lorazepam as needed Chemotherapy held 05/25/2020 due to severe hypertension, evaluated in the  emergency department Cycle 4 FOLFOX 06/03/2022 Cycle 5 FOLFOX 06/16/2022, oxaliplatin dose reduced and 5-FU bolus eliminated due to neutropenia CT 06/25/2022-unchanged soft tissue at the hepatic hilum, unchanged, bile duct stent, decreased size of peritoneal and omental nodules and diminished volume of likely fluid in the lower left pelvis Cycle 6 FOLFOX 06/30/2022 Cycle 7 FOLFOX 07/21/2022 CT 07/24/2022 (in the emergency department with nausea, headache, abdominal pain)-no acute pathology.  No significant interval change in omental nodularity. Cycle 8 FOLFOX 08/04/2022, pruritus on the palms at the end of the oxaliplatin infusion, Pepcid and Benadryl given, symptoms resolved, infusion not resumed Cycle 9 FOLFOX 08/18/2022-Pepcid and Benadryl added to premedications, Oxaliplatin diluted in a larger volume and infusion time increased Cycle 10 FOLFOX 09/08/2022 Cycle 11 FOLFOX 09/22/2022 10/06/2022 patient declined treatment CTs 10/15/2022-similar soft tissue fullness in the hepatic hilum with biliary  stents in place, decrease in omental/peritoneal metastases Maintenance capecitabine 10/26/2022 CT abdomen/pelvis 11/19/2022-mild increase in tumor studding at the left paracolic gutter and omentum, circumferential wall thickening of the rectum, biliary stents in place with minimal increase in intrahepatic biliary dilation, nonobstructive renal calculi   Hypertension G4 P3, twins Kidney stones-bilateral staghorn renal calculi on CT 08/10/2020 Infected Port-A-Cath 09/12/2019-placed on Augmentin, referred for Port-A-Cath removal; Port-A-Cath removed 09/14/2019; PICC line placed 09/14/2019; culture staph aureus.  Course of Septra completed. Neutropenia secondary to chemotherapy-Udenyca added with cycle 8 FOLFIRI Right nephrostomy tube 09/12/2020; stent placed 10/09/2020, percutaneous nephrostolithotomy treatment of right-sided kidney stones 10/09/2020, malpositioned right ureter stent replacement 10/23/2020, right ureter stent  removed 10/29/2020 Percutaneous left nephrostolithotomy 10/01/2021-no renal stone identified, left ureter stent left in place Cystoscopy 10/14/2021-stent removed 8.  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  Admission 04/16/2022 with nausea/vomiting and abdominal pain, felt to be acute toxicity related to chemotherapy, discharged home 04/18/2022 10.  Admission 11/19/2022 with nausea/vomiting and abdominal pain     April [name] has metastatic colon cancer, currently treated with maintenance capecitabine.  She was admitted yesterday with nausea/vomiting, abdominal pain, and constipation.  Her symptoms may have been related to constipation as she has experienced improvement after having multiple bowel movements.  However I am concerned she is symptomatic from carcinomatosis and tumor in the rectum.  The CT abdomen/pelvis reveals minimal evidence of disease progression involving peritoneal/omental nodules.  I recommend holding capecitabine for now.  Recommendations: 1.  GI consult to consider a sigmoidoscopy to evaluate the rectum 2.  Stool softeners 3.  Antihypertensives, she has a history of severe hypertension 4.  Hydrocodone as needed for pain 5.  Outpatient follow-up as scheduled at the cancer center   LOS: 0 days   Thornton Papas, MD   11/20/2022, 7:09 AM

## 2022-11-21 ENCOUNTER — Encounter (HOSPITAL_COMMUNITY): Payer: Self-pay

## 2022-11-21 ENCOUNTER — Encounter (HOSPITAL_COMMUNITY)
Admission: EM | Disposition: A | Discharge status: Home / Self Care | Payer: Self-pay | Source: Home / Self Care | Attending: Internal Medicine

## 2022-11-21 ENCOUNTER — Observation Stay (HOSPITAL_COMMUNITY): Payer: 59

## 2022-11-21 ENCOUNTER — Other Ambulatory Visit: Payer: Self-pay | Admitting: Oncology

## 2022-11-21 ENCOUNTER — Encounter (HOSPITAL_COMMUNITY): Payer: Self-pay | Admitting: Internal Medicine

## 2022-11-21 ENCOUNTER — Other Ambulatory Visit (HOSPITAL_COMMUNITY): Payer: Self-pay

## 2022-11-21 DIAGNOSIS — Z87891 Personal history of nicotine dependence: Secondary | ICD-10-CM | POA: Diagnosis not present

## 2022-11-21 DIAGNOSIS — Z87442 Personal history of urinary calculi: Secondary | ICD-10-CM | POA: Diagnosis not present

## 2022-11-21 DIAGNOSIS — G893 Neoplasm related pain (acute) (chronic): Secondary | ICD-10-CM | POA: Diagnosis present

## 2022-11-21 DIAGNOSIS — E876 Hypokalemia: Secondary | ICD-10-CM | POA: Diagnosis present

## 2022-11-21 DIAGNOSIS — I129 Hypertensive chronic kidney disease with stage 1 through stage 4 chronic kidney disease, or unspecified chronic kidney disease: Secondary | ICD-10-CM | POA: Diagnosis not present

## 2022-11-21 DIAGNOSIS — D128 Benign neoplasm of rectum: Secondary | ICD-10-CM | POA: Diagnosis present

## 2022-11-21 DIAGNOSIS — R0602 Shortness of breath: Secondary | ICD-10-CM | POA: Diagnosis present

## 2022-11-21 DIAGNOSIS — C786 Secondary malignant neoplasm of retroperitoneum and peritoneum: Secondary | ICD-10-CM | POA: Diagnosis present

## 2022-11-21 DIAGNOSIS — C210 Malignant neoplasm of anus, unspecified: Secondary | ICD-10-CM | POA: Diagnosis not present

## 2022-11-21 DIAGNOSIS — C785 Secondary malignant neoplasm of large intestine and rectum: Secondary | ICD-10-CM | POA: Diagnosis present

## 2022-11-21 DIAGNOSIS — R0789 Other chest pain: Secondary | ICD-10-CM | POA: Diagnosis present

## 2022-11-21 DIAGNOSIS — R000 Tachycardia, unspecified: Secondary | ICD-10-CM | POA: Diagnosis present

## 2022-11-21 DIAGNOSIS — R935 Abnormal findings on diagnostic imaging of other abdominal regions, including retroperitoneum: Secondary | ICD-10-CM

## 2022-11-21 DIAGNOSIS — C20 Malignant neoplasm of rectum: Secondary | ICD-10-CM

## 2022-11-21 DIAGNOSIS — K5900 Constipation, unspecified: Secondary | ICD-10-CM | POA: Diagnosis present

## 2022-11-21 DIAGNOSIS — D701 Agranulocytosis secondary to cancer chemotherapy: Secondary | ICD-10-CM | POA: Diagnosis present

## 2022-11-21 DIAGNOSIS — T451X5A Adverse effect of antineoplastic and immunosuppressive drugs, initial encounter: Secondary | ICD-10-CM | POA: Diagnosis present

## 2022-11-21 DIAGNOSIS — C187 Malignant neoplasm of sigmoid colon: Secondary | ICD-10-CM | POA: Diagnosis present

## 2022-11-21 DIAGNOSIS — Z79899 Other long term (current) drug therapy: Secondary | ICD-10-CM | POA: Diagnosis not present

## 2022-11-21 DIAGNOSIS — Z823 Family history of stroke: Secondary | ICD-10-CM | POA: Diagnosis not present

## 2022-11-21 DIAGNOSIS — M533 Sacrococcygeal disorders, not elsewhere classified: Secondary | ICD-10-CM | POA: Diagnosis present

## 2022-11-21 DIAGNOSIS — Z833 Family history of diabetes mellitus: Secondary | ICD-10-CM | POA: Diagnosis not present

## 2022-11-21 DIAGNOSIS — K5730 Diverticulosis of large intestine without perforation or abscess without bleeding: Secondary | ICD-10-CM | POA: Diagnosis present

## 2022-11-21 DIAGNOSIS — C19 Malignant neoplasm of rectosigmoid junction: Secondary | ICD-10-CM | POA: Diagnosis not present

## 2022-11-21 DIAGNOSIS — K621 Rectal polyp: Secondary | ICD-10-CM | POA: Diagnosis not present

## 2022-11-21 DIAGNOSIS — Z8249 Family history of ischemic heart disease and other diseases of the circulatory system: Secondary | ICD-10-CM | POA: Diagnosis not present

## 2022-11-21 DIAGNOSIS — Z888 Allergy status to other drugs, medicaments and biological substances status: Secondary | ICD-10-CM | POA: Diagnosis not present

## 2022-11-21 DIAGNOSIS — I10 Essential (primary) hypertension: Secondary | ICD-10-CM | POA: Diagnosis present

## 2022-11-21 DIAGNOSIS — N189 Chronic kidney disease, unspecified: Secondary | ICD-10-CM | POA: Diagnosis not present

## 2022-11-21 LAB — HEPATIC FUNCTION PANEL
ALT: 24 U/L (ref 0–44)
AST: 35 U/L (ref 15–41)
Albumin: 4.4 g/dL (ref 3.5–5.0)
Alkaline Phosphatase: 172 U/L — ABNORMAL HIGH (ref 38–126)
Bilirubin, Direct: <0.1 mg/dL (ref 0.0–0.2)
Total Bilirubin: 0.6 mg/dL (ref <1.2)
Total Protein: 8.9 g/dL — ABNORMAL HIGH (ref 6.5–8.1)

## 2022-11-21 LAB — RENAL FUNCTION PANEL
Albumin: 4.5 g/dL (ref 3.5–5.0)
Anion gap: 13 (ref 5–15)
BUN: 9 mg/dL (ref 6–20)
CO2: 24 mmol/L (ref 22–32)
Calcium: 10.3 mg/dL (ref 8.9–10.3)
Chloride: 101 mmol/L (ref 98–111)
Creatinine, Ser: 0.58 mg/dL (ref 0.44–1.00)
GFR, Estimated: >60 mL/min (ref >60)
Glucose, Bld: 139 mg/dL — ABNORMAL HIGH (ref 70–99)
Phosphorus: 4 mg/dL (ref 2.5–4.6)
Potassium: 3.5 mmol/L (ref 3.5–5.1)
Sodium: 138 mmol/L (ref 135–145)

## 2022-11-21 LAB — CBC
HCT: 45 % (ref 36.0–46.0)
Hemoglobin: 15.3 g/dL — ABNORMAL HIGH (ref 12.0–15.0)
MCH: 31.5 pg (ref 26.0–34.0)
MCHC: 34 g/dL (ref 30.0–36.0)
MCV: 92.8 fL (ref 80.0–100.0)
Platelets: 291 10*3/uL (ref 150–400)
RBC: 4.85 MIL/uL (ref 3.87–5.11)
RDW: 13.8 % (ref 11.5–15.5)
WBC: 8.6 10*3/uL (ref 4.0–10.5)
nRBC: 0 % (ref 0.0–0.2)

## 2022-11-21 LAB — LIPASE, BLOOD: Lipase: 33 U/L (ref 11–51)

## 2022-11-21 LAB — MAGNESIUM: Magnesium: 2.2 mg/dL (ref 1.7–2.4)

## 2022-11-21 SURGERY — FLEXIBLE SIGMOIDOSCOPY
Anesthesia: Monitor Anesthesia Care

## 2022-11-21 SURGERY — CANCELLED PROCEDURE

## 2022-11-21 MED ORDER — METOPROLOL TARTRATE 5 MG/5ML IV SOLN
5.0000 mg | Freq: Once | INTRAVENOUS | Status: AC
  Administered 2022-11-21: 5 mg via INTRAVENOUS
  Filled 2022-11-21: qty 5

## 2022-11-21 MED ORDER — SODIUM CHLORIDE 0.9 % IV BOLUS
1000.0000 mL | Freq: Once | INTRAVENOUS | Status: AC
  Administered 2022-11-21: 1000 mL via INTRAVENOUS

## 2022-11-21 MED ORDER — METOPROLOL TARTRATE 25 MG PO TABS
25.0000 mg | ORAL_TABLET | Freq: Two times a day (BID) | ORAL | Status: DC
  Administered 2022-11-21: 25 mg via ORAL
  Filled 2022-11-21: qty 1

## 2022-11-21 MED ORDER — SODIUM CHLORIDE (PF) 0.9 % IJ SOLN
INTRAMUSCULAR | Status: AC
  Filled 2022-11-21: qty 50

## 2022-11-21 MED ORDER — METOPROLOL TARTRATE 50 MG PO TABS
50.0000 mg | ORAL_TABLET | Freq: Two times a day (BID) | ORAL | Status: DC
  Administered 2022-11-21 – 2022-11-24 (×6): 50 mg via ORAL
  Filled 2022-11-21 (×6): qty 1

## 2022-11-21 MED ORDER — HYDROCODONE-ACETAMINOPHEN 10-325 MG PO TABS
1.0000 | ORAL_TABLET | Freq: Four times a day (QID) | ORAL | 0 refills | Status: DC | PRN
  Filled 2022-11-21: qty 60, 8d supply, fill #0

## 2022-11-21 MED ORDER — IOHEXOL 350 MG/ML SOLN
75.0000 mL | Freq: Once | INTRAVENOUS | Status: AC | PRN
Start: 2022-11-21 — End: 2022-11-21
  Administered 2022-11-21: 75 mL via INTRAVENOUS

## 2022-11-21 NOTE — Plan of Care (Signed)

## 2022-11-21 NOTE — Anesthesia Preprocedure Evaluation (Signed)
Anesthesia Evaluation  Patient identified by MRN, date of birth, ID band Patient awake    Reviewed: Allergy & Precautions, NPO status , Patient's Chart, lab work & pertinent test results  Airway   TM Distance: >3 FB Neck ROM: Full    Dental  (+)    Pulmonary Patient abstained from smoking., former smoker          Cardiovascular hypertension, Pt. on medications      Neuro/Psych  Headaches  negative psych ROS   GI/Hepatic negative GI ROS, Neg liver ROS,,,Stage IV colorectal cancer   Endo/Other  negative endocrine ROS    Renal/GU Renal disease  negative genitourinary   Musculoskeletal negative musculoskeletal ROS (+)    Abdominal   Peds  Hematology negative hematology ROS (+)   Anesthesia Other Findings Stage IV colon CA with biliary obstruction  Reproductive/Obstetrics                              Anesthesia Physical Anesthesia Plan  ASA: 3  Anesthesia Plan: MAC   Post-op Pain Management: Minimal or no pain anticipated   Induction: Intravenous  PONV Risk Score and Plan: 2 and Ondansetron, Treatment may vary due to age or medical condition and TIVA  Airway Management Planned: Natural Airway and Simple Face Mask  Additional Equipment:   Intra-op Plan:   Post-operative Plan: Extubation in OR  Informed Consent: I have reviewed the patients History and Physical, chart, labs and discussed the procedure including the risks, benefits and alternatives for the proposed anesthesia with the patient or authorized representative who has indicated his/her understanding and acceptance.     Dental advisory given  Plan Discussed with: CRNA  Anesthesia Plan Comments:         Anesthesia Quick Evaluation

## 2022-11-21 NOTE — Progress Notes (Signed)
   11/21/22 0529  Assess: MEWS Score  Temp 98.4 F (36.9 C)  BP (!) 148/119  MAP (mmHg) 129  Pulse Rate (!) 116  Resp 15  SpO2 100 %  Assess: MEWS Score  MEWS Temp 0  MEWS Systolic 0  MEWS Pulse 2  MEWS RR 0  MEWS LOC 0  MEWS Score 2  MEWS Score Color Yellow  Assess: if the MEWS score is Yellow or Red  Were vital signs accurate and taken at a resting state? Yes  Does the patient meet 2 or more of the SIRS criteria? No  MEWS guidelines implemented  Yes, yellow  Treat  MEWS Interventions Considered administering scheduled or prn medications/treatments as ordered  Take Vital Signs  Increase Vital Sign Frequency  Yellow: Q2hr x1, continue Q4hrs until patient remains green for 12hrs  Escalate  MEWS: Escalate Yellow: Discuss with charge nurse and consider notifying provider and/or RRT  Notify: Charge Nurse/RN  Name of Charge Nurse/RN Notified Tom, RN  Assess: SIRS CRITERIA  SIRS Temperature  0  SIRS Pulse 1  SIRS Respirations  0  SIRS WBC 0  SIRS Score Sum  1   Gave PRN hydralazine. Liana Crocker NP notified of Yellow MEWS no new orders.

## 2022-11-21 NOTE — Progress Notes (Signed)
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   Assessment & Plan:   Principal Problem:   Cancer related pain Active Problems:   Hypertension   Hypokalemia   Colorectal cancer, stage IV (HCC)   Constipation  1 stage IV colon cancer with omental/peritoneal involvement/cancer associated pain/constipation -Patient noted to have presented worsening abdominal and rectal pain which was worse with bowel movements with associated bowel cramping. -Abdominal films done with paucity of bowel gas without definite evidence of enteric obstruction.  Bilateral nephrolithiasis. -CT abdomen and pelvis done negative for bowel obstruction however did show changes concerning for enlarging tumor starting along the left paracolic gutter and mildly worsening omental nodularity. -Patient noted to have bowel movement with some clinical improvement however still with abdominal cramping as of 11/20/2022. -Patient underwent bowel prep overnight states abdominal cramping and pain has improved.. -Pain currently controlled on current pain regiment. -Patient seen in consultation by hematology/oncology Dr. Truett Perna who reviewed abdominal films and recommended GI consultation to consider sigmoidoscopy to evaluate the rectum. -Patient seen in consultation by GI, who reviewed films, lab work, endoscopy results in the past and CT scans with concern for possible distal colon cancer and as such patient scheduled for colonoscopy today. -Continue MiraLAX  twice daily, Senokot-S twice daily.   -Supportive care.   -GI following and appreciate their input and recommendations.   2.  Hypertension -BP elevated overnight and this morning.   -Continue home regimen Norvasc 10 mg daily and chlorthalidone 25 mg daily. -Start Lopressor 25 mg p.o. twice daily. -IV hydralazine as needed.  3.  Hypokalemia -Magnesium at 2.2. -Potassium at 3.5 this morning. -Kdur 40 mEq daily.  4.  Constipation -Continue MiraLAX twice daily.   -Senokot-S twice daily.   -Patient underwent bowel prep overnight for colonoscopy today.     DVT prophylaxis: Lovenox Code Status: Full Family Communication: Updated patient.  No family at bedside. Disposition: TBD  Status is: Observation The patient remains OBS appropriate and will d/c before 2 midnights.   Consultants:  Hematology/oncology: Dr. Truett Perna GI: Dr. Marina Goodell 11/20/2022  Procedures:  CT abdomen and pelvis 11/19/2022 Abdominal x-ray 11/19/2022   Antimicrobials:  Anti-infectives (From admission, onward)    None         Subjective: Patient lying in bed sleeping but arousable.  Denies any chest pain.  Stated last night felt she had to catch her breath which has since improved.  States abdominal pain has improved significantly.  Had bowel prep overnight with stool output.  Awaiting colonoscopy to be done today.   Objective: Vitals:   11/21/22 0148 11/21/22 0529 11/21/22 0634 11/21/22 0656  BP: (!) 123/95 (!) 148/119 (!) 148/110 (!) 152/124  Pulse: (!) 106 (!) 116  (!) 119  Resp:  15 16   Temp:  98.4 F (36.9 C)    TempSrc:   Oral   SpO2:  100%    Weight:      Height:       No intake or output data in the 24 hours ending 11/21/22 0923  Filed Weights   11/19/22 1820  Weight: 64.9 kg    Examination:  General exam: NAD Respiratory system: Lungs clear to auscultation bilaterally.  No wheezes, no crackles, no rhonchi.  Fair air movement.  Speaking in full sentences.   Cardiovascular system:  Tachycardia.  No JVD.  No murmurs rubs or gallops.  No pitting lower extremity edema.  Gastrointestinal system: Abdomen is soft, nontender, nondistended, positive bowel sounds.  No rebound.  No guarding.  Central nervous system: Alert and oriented. No focal neurological deficits. Extremities: Symmetric 5 x 5 power. Skin: No rashes, lesions or ulcers Psychiatry: Judgement and insight appear normal. Mood & affect appropriate.     Data Reviewed: I have personally reviewed following labs and imaging studies  CBC: Recent Labs  Lab 11/19/22 1619 11/20/22 0550 11/21/22 0557  WBC 7.5 6.7 8.6  NEUTROABS 4.2  --   --   HGB 14.5 13.0 15.3*  HCT 43.0 38.4 45.0  MCV 91.7 92.3 92.8  PLT 285 242 291    Basic Metabolic Panel: Recent Labs  Lab 11/19/22 1619 11/20/22 0550 11/21/22 0557  NA 139 137 138  K 3.1* 3.1* 3.5  CL 98 100 101  CO2 28 25 24   GLUCOSE 86 88 139*  BUN 7 7 9   CREATININE 0.57 0.50 0.58  CALCIUM 10.6* 9.1 10.3  MG  --  2.0 2.2  PHOS  --   --  4.0    GFR: Estimated Creatinine Clearance: 74.3 mL/min (by C-G formula based on SCr of 0.58 mg/dL).  Liver Function Tests: Recent Labs  Lab 11/19/22 1619 11/21/22 0557  AST 22  --   ALT 13  --   ALKPHOS 159*  --   BILITOT 0.4  --   PROT 8.6*  --   ALBUMIN 4.4 4.5    CBG: No results for input(s): "GLUCAP" in the last 168 hours.   No results found for this or any previous visit (from the past 240 hour(s)).       Radiology Studies: CT ABDOMEN PELVIS W CONTRAST  Result Date: 11/19/2022 CLINICAL DATA:  Colon cancer, recent chemotherapy. Abdominal pain with low back pain and pelvic pain. * Tracking Code: BO * EXAM: CT ABDOMEN AND PELVIS WITH CONTRAST TECHNIQUE: Multidetector CT imaging of the abdomen and pelvis was performed using the standard protocol following bolus administration of intravenous contrast. RADIATION DOSE REDUCTION: This exam was performed according to the departmental dose-optimization program  which includes automated exposure control, adjustment of the mA and/or kV according to patient size and/or use of iterative reconstruction technique. CONTRAST:  80mL OMNIPAQUE IOHEXOL 300 MG/ML  SOLN COMPARISON:  10/15/2022 CT scan FINDINGS: Lower chest: Unremarkable Hepatobiliary: Two biliary stents remain in place dilated and thick-walled right and left hepatic ducts with associated surrounding hypoenhancement similar to the 10/15/2022 exam. Small amount of pneumobilia. The overall degree of peripheral intrahepatic biliary dilatation is minimally increased from prior. 6 mm left hepatic lobe cyst on image 7 series 2 unchanged. Pancreas: Mild dilation of the dorsal pancreatic duct in the pancreatic head similar to prior. Spleen: Unremarkable Adrenals/Urinary Tract: Stable appearance of bilateral nonobstructive renal calculi. Mild scarring in portions of the right kidney. Both adrenal glands appear normal. No hydronephrosis or hydroureter. Urinary bladder relatively empty but otherwise unremarkable. Stomach/Bowel: Substantial circumferential wall thickening in the rectum, residual tumor not excluded. No current dilated bowel. Normal appendix. Vascular/Lymphatic: Mild atheromatous vascular calcification of the common iliac arteries. Reproductive: Poor definition of fat planes between the uterus on the thick-walled rectum. Other: Enlarging tumor studding along  the left paracolic gutter, with a nodule for example measuring 1.6 by 1.3 cm on image 34 series 2 and previously measuring 0.9 by 0.8 cm. Additional tumor studding along the left paracolic gutter is worsened. Mild change in orientation of the omentum complicates comparison of the omental nodularity. Most of this nodularity is fairly stable although components such as the 1.2 by 1.1 cm omental soft tissue nodule on image 46 of series 2 appear increased. Musculoskeletal: Moderate to prominent left and moderate right degenerative hip arthropathy. Levoconvex lumbar  scoliosis. 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 IMPRESSION: 1. Worsening tumor studding along the left paracolic gutter. 2. Mildly worsening omental nodularity. 3. Substantial circumferential wall thickening in the rectum, residual tumor not excluded. Poor definition of fat planes between the uterus and the thick-walled rectum. 4. Two biliary stents remain in place with dilated and thick-walled right and left hepatic ducts with associated surrounding hypoenhancement similar to the 10/15/2022 exam. The overall degree of peripheral intrahepatic biliary dilatation is minimally increased from prior. 5. Stable appearance of bilateral nonobstructive renal calculi. 6. 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 7. Moderate to prominent left and moderate right degenerative hip arthropathy. 8. Levoconvex lumbar scoliosis. 9. Mild atheromatous vascular calcification of the common iliac arteries. Electronically Signed   By: Gaylyn Rong M.D.   On: 11/19/2022 20:00   DG Abd 2 Views  Result Date: 11/19/2022 CLINICAL DATA:  Abdominal pain.  History of metastatic colon cancer EXAM: ABDOMEN - 2 VIEW COMPARISON:  06/23/2022; CT abdomen pelvis-06/25/2022 FINDINGS: Paucity of bowel gas without definitive evidence of enteric obstruction. No pneumoperitoneum, pneumatosis or portal venous gas Punctate opacities overlie the bilateral renal fossa compatible with known bilateral nephrolithiasis. Punctate phlebolith overlies the right hemipelvis, unchanged. Biliary stents overlie the right upper abdominal quadrant. Limited visualization of the lower thorax is normal given obliquity and technique Mild scoliotic curvature of the thoracolumbar spine. No definite acute osseous abnormalities. IMPRESSION: 1. Paucity of bowel gas without definitive evidence of enteric obstruction. 2. Bilateral nephrolithiasis. Electronically Signed   By: Simonne Come M.D.   On:  11/19/2022 15:38        Scheduled Meds:  amLODipine  10 mg Oral Daily   chlorthalidone  25 mg Oral Daily   enoxaparin (LOVENOX) injection  40 mg Subcutaneous Q24H   metoprolol tartrate  25 mg Oral BID   polyethylene glycol  17 g Oral BID   potassium chloride  40 mEq Oral Daily   senna-docusate  1 tablet Oral BID   Continuous Infusions:   LOS: 0 days    Time spent: 40 minutes    Ramiro Harvest, MD [facility]   To contact the attending provider between 7A-7P or the covering provider during after hours 7P-7A, please log into the web site [email or web address] and access using universal [place] password for that web site. If you do not have the password, please call the hospital operator.  11/21/2022, 9:23 AM

## 2022-11-21 NOTE — Progress Notes (Signed)
IP PROGRESS NOTE  Subjective:   [name] reports improvement in abdominal pain.  She had multiple bowel movements after the bowel prep last night.  No bleeding.  She was seen by gastroenterology yesterday and is scheduled for a sigmoidoscopy today.  Objective: Vital signs in last 24 hours: Blood pressure (!) 152/124, pulse (!) 119, temperature 98.4 F (36.9 C), resp. rate 16, height 5\' 4"  (1.626 m), weight 143 lb (64.9 kg), last menstrual period 01/07/2006, SpO2 100%.  Intake/Output from previous day: No intake/output data recorded.  Physical Exam:  HEENT: No thrush or ulcers  Abdomen: Soft and nontender, no mass, no hepatosplenomegaly Extremities: No leg edema   Lab Results: Recent Labs    11/20/22 0550 11/21/22 0557  WBC 6.7 8.6  HGB 13.0 15.3*  HCT 38.4 45.0  PLT 242 291    BMET Recent Labs    11/19/22 1619 11/20/22 0550  NA 139 137  K 3.1* 3.1*  CL 98 100  CO2 28 25  GLUCOSE 86 88  BUN 7 7  CREATININE 0.57 0.50  CALCIUM 10.6* 9.1    Lab Results  Component Value Date   CEA1 <0.6 03/23/2022   CEA 3.47 11/13/2022   [id number] 213 (H) 03/23/2022    Studies/Results: CT ABDOMEN PELVIS W CONTRAST  Result Date: 11/19/2022 CLINICAL DATA:  Colon cancer, recent chemotherapy. Abdominal pain with low back pain and pelvic pain. * Tracking Code: BO * EXAM: CT ABDOMEN AND PELVIS WITH CONTRAST TECHNIQUE: Multidetector CT imaging of the abdomen and pelvis was performed using the standard protocol following bolus administration of intravenous contrast. RADIATION DOSE REDUCTION: This exam was performed according to the departmental dose-optimization program which includes automated exposure control, adjustment of the mA and/or kV according to patient size and/or use of iterative reconstruction technique. CONTRAST:  80mL OMNIPAQUE IOHEXOL 300 MG/ML  SOLN COMPARISON:  10/15/2022 CT scan FINDINGS: Lower chest: Unremarkable Hepatobiliary: Two biliary stents remain in place  dilated and thick-walled right and left hepatic ducts with associated surrounding hypoenhancement similar to the 10/15/2022 exam. Small amount of pneumobilia. The overall degree of peripheral intrahepatic biliary dilatation is minimally increased from prior. 6 mm left hepatic lobe cyst on image 7 series 2 unchanged. Pancreas: Mild dilation of the dorsal pancreatic duct in the pancreatic head similar to prior. Spleen: Unremarkable Adrenals/Urinary Tract: Stable appearance of bilateral nonobstructive renal calculi. Mild scarring in portions of the right kidney. Both adrenal glands appear normal. No hydronephrosis or hydroureter. Urinary bladder relatively empty but otherwise unremarkable. Stomach/Bowel: Substantial circumferential wall thickening in the rectum, residual tumor not excluded. No current dilated bowel. Normal appendix. Vascular/Lymphatic: Mild atheromatous vascular calcification of the common iliac arteries. Reproductive: Poor definition of fat planes between the uterus on the thick-walled rectum. Other: Enlarging tumor studding along the left paracolic gutter, with a nodule for example measuring 1.6 by 1.3 cm on image 34 series 2 and previously measuring 0.9 by 0.8 cm. Additional tumor studding along the left paracolic gutter is worsened. Mild change in orientation of the omentum complicates comparison of the omental nodularity. Most of this nodularity is fairly stable although components such as the 1.2 by 1.1 cm omental soft tissue nodule on image 46 of series 2 appear increased. Musculoskeletal: Moderate to prominent left and moderate right degenerative hip arthropathy. Levoconvex lumbar scoliosis. 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 IMPRESSION: 1. Worsening tumor studding along the left paracolic gutter. 2. Mildly worsening omental nodularity. 3. Substantial  circumferential wall thickening in the rectum, residual tumor not excluded. Poor definition of  fat planes between the uterus and the thick-walled rectum. 4. Two biliary stents remain in place with dilated and thick-walled right and left hepatic ducts with associated surrounding hypoenhancement similar to the 10/15/2022 exam. The overall degree of peripheral intrahepatic biliary dilatation is minimally increased from prior. 5. Stable appearance of bilateral nonobstructive renal calculi. 6. 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 7. Moderate to prominent left and moderate right degenerative hip arthropathy. 8. Levoconvex lumbar scoliosis. 9. Mild atheromatous vascular calcification of the common iliac arteries. Electronically Signed   By: Gaylyn Rong M.D.   On: 11/19/2022 20:00   DG Abd 2 Views  Result Date: 11/19/2022 CLINICAL DATA:  Abdominal pain.  History of metastatic colon cancer EXAM: ABDOMEN - 2 VIEW COMPARISON:  06/23/2022; CT abdomen pelvis-06/25/2022 FINDINGS: Paucity of bowel gas without definitive evidence of enteric obstruction. No pneumoperitoneum, pneumatosis or portal venous gas Punctate opacities overlie the bilateral renal fossa compatible with known bilateral nephrolithiasis. Punctate phlebolith overlies the right hemipelvis, unchanged. Biliary stents overlie the right upper abdominal quadrant. Limited visualization of the lower thorax is normal given obliquity and technique Mild scoliotic curvature of the thoracolumbar spine. No definite acute osseous abnormalities. IMPRESSION: 1. Paucity of bowel gas without definitive evidence of enteric obstruction. 2. Bilateral nephrolithiasis. Electronically Signed   By: Simonne Come M.D.   On: 11/19/2022 15:38    Medications: I have reviewed the patient's current medications.  Assessment/Plan:  Sigmoid colon cancer, stage IV (pT4a,pN2b,M1c) Colonoscopy 03/24/2019-3 rectal polyps-hyperplastic polyps, distal colon biopsy-at least intramucosal adenocarcinoma, completely obstructing mass in the distal sigmoid  colon, could not be traversed, intact mismatch repair protein expression 03/24/2019-CEA 56.1 03/29/2019 CT abdomen/pelvis-circumferential thickening involving the entire mid and distal sigmoid colon to the level of the rectosigmoid junction, solid/cystic mass in the left ovary, bilateral nephrolithiasis Robotic assisted low anterior resection, mesenteric lymphadenectomy, bilateral salpingo-oophorectomy 04/08/2019 Pathology ([facility] review of outside pathology) metastatic adenocarcinoma involving the peritoneum overlying the round ligament, serosa of the urinary bladder, serosa of the right ureter a sacral area, pelvic peritoneum, and left ovary.  Omental biopsy with focal mucin pools with no carcinoma cells identified, right hemidiaphragm biopsy involved by metastatic adenocarcinoma, invasive adenocarcinoma the sigmoid colon, moderately differentiated, T4a, perineural and vascular invasion present, 7/22 lymph nodes, multiple tumor deposits, resection margins negative Negative for PD-L1, low probability of MSI-high, HER-2 negative, negative for BRAF, NRAS and KRAS alterations CTs 05/19/2019-no evidence of metastatic disease, findings suspicious for colitis of the transverse and ascending colon, small amount of ascites in the cul-de-sac, bilateral renal calculi Cycle 1 FOLFIRI 05/24/2019, bevacizumab added with cycle 2 Cycle 4 FOLFIRI/bevacizumab 07/05/2019 Cycle 5 FOLFIRI 07/27/2019, bevacizumab held secondary to hypertension Cycle 6 FOLFIRI 08/10/1999, bevacizumab held secondary to hypertension CTs 08/30/2019-no evidence of recurrent disease, new subsolid right lower lobe nodule felt to be inflammatory, emphysema Cycle 7 FOLFIRI 09/26/2019, bevacizumab remains on hold secondary to hypertension Cycle 8 FOLFIRI 10/17/2019, bevacizumab held secondary to hypertension, Udenyca added for neutropenia Cycle 9 FOLFIRI 11/07/2019, bevacizumab held secondary to hypertension, Udenyca Cycle 10 FOLFIRI 11/28/2019, bevacizumab  held, Udenyca Cycle 11 FOLFIRI 12/26/2019, bevacizumab held, Udenyca CTs 01/25/2020-no evidence of recurrent disease, resolution of right lower lobe nodule Maintenance Xeloda beginning 02/06/2020 CTs 04/25/2020- no evidence of metastatic disease, multiple bilateral renal calculi without hydronephrosis Maintenance Xeloda continued CT abdomen/pelvis without contrast 08/10/2020-bilateral staghorn renal calculi, no evidence of metastatic disease; addendum  08/23/2020-small but increasing omental nodules. Cycle 1 FOLFIRI/panitumumab 09/19/2020 Cycle 2 FOLFIRI/Panitumumab 10/03/2020 Cycle 3 FOLFIRI/Panitumumab 10/17/2020 Cycle 4 FOLFIRI/panitumumab 10/31/2020 Cycle 5 FOLFIRI/Panitumumab 11/14/2020 CTs 11/26/2020-stable omental metastases compared to 10/22/2020, mildly decreased from 08/10/2020.  Left lower quadrant tiny paracolic gutter implant slightly decreased from CT 08/10/2020.  No new or progressive metastatic disease in the abdomen or pelvis. Cycle 6 FOLFIRI/Panitumumab 11/28/2020, irinotecan dose reduced, treatment schedule adjusted to every 3 weeks going forward Cycle 7 FOLFIRI/panitumumab 12/24/2020 Cycle 8 FOLFIRI/Panitumumab 01/16/2021--treatment held due to hypertension in the infusion area. Cycle 8 FOLFIRI/panitumumab 01/21/2021 Cycle 9 FOLFIRI/panitumumab 02/11/2021 Cycle 10 FOLFIRI/Panitumumab 03/04/2021 Cycle 11 FOLFIRI/Panitumumab 03/25/2021 CT abdomen/pelvis 04/10/2021-stable right omental and left posterior paracolic gutter nodules Cycle 12 FOLFIRI/panitumumab 04/15/2021 Cycle 13 FOLFIRI/panitumumab 05/06/2021 Cycle 14 FOLFIRI/Panitumumab 05/27/2021 Cycle 15 FOLFIRI/Panitumumab 06/17/2021 CT abdomen/pelvis 07/05/2021-slight enlargement of a dominant right omental implant, no new implants Patient requested a treatment break CT abdomen/pelvis 10/04/2021-mild increase in size of multiple omental soft tissue nodules, 4 mm calculus in the distal left ureter adjacent to the ureteral stent Cycle 1 Lonsurf  10/14/2021 10/28/2021 Avastin every 2 weeks Cycle 2 Lonsurf 11/11/2021 Cycle 3 Lonsurf 12/09/2021 Cycle 4 Lonsurf 01/06/2022 Avastin held 01/20/2022 due to proteinuria, 24-hour urine 43 mg CT/pelvis 01/30/2022-possible new peritoneal implant at the transverse colon, no significant change in other omental/peritoneal implants, stable chronic rectal wall thickening, status post removal of double-J left ureteral stent with no evidence of hydronephrosis or ureteral calculus, nonobstructing bilateral renal calculi Cycle 5 Lonsurf 02/05/2022 or 02/06/2022 Avastin every 2 weeks Cycle 6 Lonsurf 03/03/2022 CTs 04/05/2022-increase in size and number of peritoneal implants compared to January 2024 central liver mass narrowing the anterior branch of the right portal vein, decreased biliary duct dilation, biliary stents in place, stable right cardiophrenic lymph nodes, separate pigtail stent in the lumen of the proximal duodenum Cycle 1 FOLFOX 04/14/2022 CT abdomen/pelvis 04/16/2022-stent within the third portion of the duodenum, stable peritoneal nodules, indwelling biliary stents with mild left biliary duct dilation Cycle 2 FOLFOX 04/28/2022, Emend and prophylactic dexamethasone added Cycle 3 FOLFOX 05/12/2022, Aloxi, Emend, prophylactic dexamethasone, Compazine and lorazepam as needed Chemotherapy held 05/25/2020 due to severe hypertension, evaluated in the emergency department Cycle 4 FOLFOX 06/03/2022 Cycle 5 FOLFOX 06/16/2022, oxaliplatin dose reduced and 5-FU bolus eliminated due to neutropenia CT 06/25/2022-unchanged soft tissue at the hepatic hilum, unchanged, bile duct stent, decreased size of peritoneal and omental nodules and diminished volume of likely fluid in the lower left pelvis Cycle 6 FOLFOX 06/30/2022 Cycle 7 FOLFOX 07/21/2022 CT 07/24/2022 (in the emergency department with nausea, headache, abdominal pain)-no acute pathology.  No significant interval change in omental nodularity. Cycle 8 FOLFOX 08/04/2022,  pruritus on the palms at the end of the oxaliplatin infusion, Pepcid and Benadryl given, symptoms resolved, infusion not resumed Cycle 9 FOLFOX 08/18/2022-Pepcid and Benadryl added to premedications, Oxaliplatin diluted in a larger volume and infusion time increased Cycle 10 FOLFOX 09/08/2022 Cycle 11 FOLFOX 09/22/2022 10/06/2022 patient declined treatment CTs 10/15/2022-similar soft tissue fullness in the hepatic hilum with biliary stents in place, decrease in omental/peritoneal metastases Maintenance capecitabine 10/26/2022 CT abdomen/pelvis 11/19/2022-mild increase in tumor studding at the left paracolic gutter and omentum, circumferential wall thickening of the rectum, biliary stents in place with minimal increase in intrahepatic biliary dilation, nonobstructive renal calculi   Hypertension G4 P3, twins Kidney stones-bilateral staghorn renal calculi on CT 08/10/2020 Infected Port-A-Cath 09/12/2019-placed on Augmentin, referred for Port-A-Cath removal; Port-A-Cath removed 09/14/2019; PICC line placed 09/14/2019; culture staph aureus.  Course of Septra completed.  Neutropenia secondary to chemotherapy-Udenyca added with cycle 8 FOLFIRI Right nephrostomy tube 09/12/2020; stent placed 10/09/2020, percutaneous nephrostolithotomy treatment of right-sided kidney stones 10/09/2020, malpositioned right ureter stent replacement 10/23/2020, right ureter stent removed 10/29/2020 Percutaneous left nephrostolithotomy 10/01/2021-no renal stone identified, left ureter stent left in place Cystoscopy 10/14/2021-stent removed 8.  Admission 03/21/2022 with new onset jaundice, abdominal pain, nausea MRI abdomen 03/22/2022-central liver mass with obstruction at the confluence of the intrahepatic ducts and proximal common hepatic duct with severe Intermatic biliary ductal dilatation, progressive peritoneal carcinomatosis ERCP 03/24/2022-severe biliary stricture in the hepatic duct affecting the left and right hepatic duct, common hepatic  duct, and bifurcation, malignant appearing, temporary plastic pancreatic stent, left and right hepatic duct stents were placed 9.  Admission 04/16/2022 with nausea/vomiting and abdominal pain, felt to be acute toxicity related to chemotherapy, discharged home 04/18/2022 10.  Admission 11/19/2022 with nausea/vomiting and abdominal pain-improved     April [name] has metastatic colon cancer, currently treated with maintenance capecitabine.  She was admitted the 11/19/2022 with nausea/vomiting, abdominal pain, and constipation.  Her symptoms may have been related to constipation as she has experienced improvement after having multiple bowel movements.  The constipation may be secondary to carcinomatosis, narcotic analgesics, or tumor involving the rectum.  She is scheduled for sigmoidoscopy today.  The CT abdomen/pelvis reveals minimal evidence of disease progression involving peritoneal/omental nodules.  I discussed the CT findings with her.  Capecitabine will be discontinued.  We will need to consider changing to a different systemic treatment regimen.  We can consider repeat treatment with panitumumab based therapy.  I will refer her to [facility] check eligibility for a clinical trial.  She has seen Dr. Rod Mae in the past.  Recommendations: 1.  Proceed with sigmoidoscopy today 2.  Bowel regimen as recommended by gastroenterology 3.  Antihypertensives, she has a history of severe hypertension 4.  Hydrocodone as needed for pain-I will refill her outpatient prescription 5.  Outpatient follow-up as scheduled at the cancer center    LOS: 0 days   Thornton Papas, MD   11/21/2022, 7:04 AM

## 2022-11-21 NOTE — Progress Notes (Addendum)
Progress Note  Primary GI: Dr. Meridee Score DOA: 11/19/2022         Hospital Day: 3   Subjective  Chief Complaint:  rectal pain/AB pain/change in bowel habits in patient with metastatic colon cancer   No family was present at the time of my evaluation. Patient completed prep last night with dirty colored stools this AM, last one this morning. No nausea, vomiting. Still with rectal pain, no hematochezia or melena.  Her BP and HR were elevated this morning, max HR 135, max BP 166/127. Patient states she feels some palpitations, had some left sided chest discomfort last night with some DOE, some sweating, some radiation to her back.  Denies swelling in her legs, no history of PE/DVT.    Objective   Vital signs in last 24 hours: Temp:  [98.4 F (36.9 C)-98.6 F (37 C)] 98.6 F (37 C) (11/08 0948) Pulse Rate:  [101-135] 135 (11/08 1000) Resp:  [15-16] 16 (11/08 0634) BP: (123-166)/(95-127) 156/120 (11/08 1000) SpO2:  [100 %] 100 % (11/08 0948) Last BM Date : 11/20/22 Last BM recorded by nurses in past 5 days Stool Type: Type 7 (Liquid consistency with no solid pieces) (11/21/2022  6:34 AM)  General:   female in no acute distress  Heart:  Regular rate and rhythm; no murmurs Pulm: Clear anteriorly; no wheezing Abdomen:  Soft, Obese AB, Active bowel sounds. moderate tenderness in the epigastrium. Without guarding and Without rebound, No organomegaly appreciated. Extremities:  without  edema. Neurologic:  Alert and  oriented x4;  No focal deficits.  Psych:  Cooperative. Normal mood and affect.  Intake/Output from previous day: No intake/output data recorded. Intake/Output this shift: No intake/output data recorded.  Studies/Results: CT ABDOMEN PELVIS W CONTRAST  Result Date: 11/19/2022 CLINICAL DATA:  Colon cancer, recent chemotherapy. Abdominal pain with low back pain and pelvic pain. * Tracking Code: BO * EXAM: CT ABDOMEN AND PELVIS WITH CONTRAST TECHNIQUE: Multidetector CT  imaging of the abdomen and pelvis was performed using the standard protocol following bolus administration of intravenous contrast. RADIATION DOSE REDUCTION: This exam was performed according to the departmental dose-optimization program which includes automated exposure control, adjustment of the mA and/or kV according to patient size and/or use of iterative reconstruction technique. CONTRAST:  80mL OMNIPAQUE IOHEXOL 300 MG/ML  SOLN COMPARISON:  10/15/2022 CT scan FINDINGS: Lower chest: Unremarkable Hepatobiliary: Two biliary stents remain in place dilated and thick-walled right and left hepatic ducts with associated surrounding hypoenhancement similar to the 10/15/2022 exam. Small amount of pneumobilia. The overall degree of peripheral intrahepatic biliary dilatation is minimally increased from prior. 6 mm left hepatic lobe cyst on image 7 series 2 unchanged. Pancreas: Mild dilation of the dorsal pancreatic duct in the pancreatic head similar to prior. Spleen: Unremarkable Adrenals/Urinary Tract: Stable appearance of bilateral nonobstructive renal calculi. Mild scarring in portions of the right kidney. Both adrenal glands appear normal. No hydronephrosis or hydroureter. Urinary bladder relatively empty but otherwise unremarkable. Stomach/Bowel: Substantial circumferential wall thickening in the rectum, residual tumor not excluded. No current dilated bowel. Normal appendix. Vascular/Lymphatic: Mild atheromatous vascular calcification of the common iliac arteries. Reproductive: Poor definition of fat planes between the uterus on the thick-walled rectum. Other: Enlarging tumor studding along the left paracolic gutter, with a nodule for example measuring 1.6 by 1.3 cm on image 34 series 2 and previously measuring 0.9 by 0.8 cm. Additional tumor studding along the left paracolic gutter is worsened. Mild change in orientation of the omentum complicates comparison  of the omental nodularity. Most of this nodularity is  fairly stable although components such as the 1.2 by 1.1 cm omental soft tissue nodule on image 46 of series 2 appear increased. Musculoskeletal: Moderate to prominent left and moderate right degenerative hip arthropathy. Levoconvex lumbar scoliosis. 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 IMPRESSION: 1. Worsening tumor studding along the left paracolic gutter. 2. Mildly worsening omental nodularity. 3. Substantial circumferential wall thickening in the rectum, residual tumor not excluded. Poor definition of fat planes between the uterus and the thick-walled rectum. 4. Two biliary stents remain in place with dilated and thick-walled right and left hepatic ducts with associated surrounding hypoenhancement similar to the 10/15/2022 exam. The overall degree of peripheral intrahepatic biliary dilatation is minimally increased from prior. 5. Stable appearance of bilateral nonobstructive renal calculi. 6. 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 7. Moderate to prominent left and moderate right degenerative hip arthropathy. 8. Levoconvex lumbar scoliosis. 9. Mild atheromatous vascular calcification of the common iliac arteries. Electronically Signed   By: Gaylyn Rong M.D.   On: 11/19/2022 20:00   DG Abd 2 Views  Result Date: 11/19/2022 CLINICAL DATA:  Abdominal pain.  History of metastatic colon cancer EXAM: ABDOMEN - 2 VIEW COMPARISON:  06/23/2022; CT abdomen pelvis-06/25/2022 FINDINGS: Paucity of bowel gas without definitive evidence of enteric obstruction. No pneumoperitoneum, pneumatosis or portal venous gas Punctate opacities overlie the bilateral renal fossa compatible with known bilateral nephrolithiasis. Punctate phlebolith overlies the right hemipelvis, unchanged. Biliary stents overlie the right upper abdominal quadrant. Limited visualization of the lower thorax is normal given obliquity and technique Mild scoliotic curvature  of the thoracolumbar spine. No definite acute osseous abnormalities. IMPRESSION: 1. Paucity of bowel gas without definitive evidence of enteric obstruction. 2. Bilateral nephrolithiasis. Electronically Signed   By: Simonne Come M.D.   On: 11/19/2022 15:38    Lab Results: Recent Labs    11/19/22 1619 11/20/22 0550 11/21/22 0557  WBC 7.5 6.7 8.6  HGB 14.5 13.0 15.3*  HCT 43.0 38.4 45.0  PLT 285 242 291   BMET Recent Labs    11/19/22 1619 11/20/22 0550 11/21/22 0557  NA 139 137 138  K 3.1* 3.1* 3.5  CL 98 100 101  CO2 28 25 24   GLUCOSE 86 88 139*  BUN 7 7 9   CREATININE 0.57 0.50 0.58  CALCIUM 10.6* 9.1 10.3   LFT Recent Labs    11/19/22 1619 11/21/22 0557  PROT 8.6*  --   ALBUMIN 4.4 4.5  AST 22  --   ALT 13  --   ALKPHOS 159*  --   BILITOT 0.4  --    PT/INR No results for input(s): "LABPROT", "INR" in the last 72 hours.   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 Has been following with Dr. Truett Perna outpatient with multiple rounds of chemotherapy, currently on maintenance chemotherapy  Xeloda, last dose about a month ago. Patient currently n.p.o., had movi prep last night that she tolerated with clear stools this morning. However she had accelerated hypertension with tachycardia Being treated at this time and pending CTA to rule out PE Will let patient have clear liquids today Can do half prep tonight Plan for potential colonoscopy tomorrow with Dr. Marina Goodell  Accelerated  hypertension with tachycardia associated with some mild left chest discomfort radiation to her back with DOE Currently getting amlodipine, Lopressor, hydralazine 10 mg as needed has had 2 doses Patient has not been receiving her Lovenox injections Most recent blood pressure 170/130, heart rate still 135 Plan on CTA to rule out PE Lipase negative, repeat liver function unchanged  Malignant biliary stricture Stable liver function Status post ERCP 3/11 status post sphincterectomy, pancreatic stent placed CT today shows temporary plastic pancreatic stents Patient has follow-up with Dr. Meridee Score in December for repeat ERCP for stent removal    Principal Problem:   Cancer related pain Active Problems:   Hypertension   Hypokalemia   Colorectal cancer, stage IV (HCC)   Constipation    LOS: 0 days   Doree Albee  11/21/2022, 10:27 AM  GI ATTENDING  Interval events as outlined above noted.  Medicine working up new problems.  Colonoscopy has been canceled with no rescheduled at this point.  Will follow.  Wilhemina Bonito. Eda Keys., M.D. [facility] Division of Gastroenterology

## 2022-11-22 DIAGNOSIS — E876 Hypokalemia: Secondary | ICD-10-CM | POA: Diagnosis not present

## 2022-11-22 DIAGNOSIS — R000 Tachycardia, unspecified: Secondary | ICD-10-CM

## 2022-11-22 DIAGNOSIS — G893 Neoplasm related pain (acute) (chronic): Secondary | ICD-10-CM | POA: Diagnosis not present

## 2022-11-22 DIAGNOSIS — R935 Abnormal findings on diagnostic imaging of other abdominal regions, including retroperitoneum: Secondary | ICD-10-CM | POA: Diagnosis not present

## 2022-11-22 DIAGNOSIS — C19 Malignant neoplasm of rectosigmoid junction: Secondary | ICD-10-CM | POA: Diagnosis not present

## 2022-11-22 DIAGNOSIS — R0602 Shortness of breath: Secondary | ICD-10-CM

## 2022-11-22 DIAGNOSIS — K5900 Constipation, unspecified: Secondary | ICD-10-CM | POA: Diagnosis not present

## 2022-11-22 LAB — CBC WITH DIFFERENTIAL/PLATELET
Abs Immature Granulocytes: 0.02 10*3/uL (ref 0.00–0.07)
Basophils Absolute: 0.1 10*3/uL (ref 0.0–0.1)
Basophils Relative: 1 %
Eosinophils Absolute: 0.2 10*3/uL (ref 0.0–0.5)
Eosinophils Relative: 3 %
HCT: 43.9 % (ref 36.0–46.0)
Hemoglobin: 14.8 g/dL (ref 12.0–15.0)
Immature Granulocytes: 0 %
Lymphocytes Relative: 37 %
Lymphs Abs: 2.7 10*3/uL (ref 0.7–4.0)
MCH: 31 pg (ref 26.0–34.0)
MCHC: 33.7 g/dL (ref 30.0–36.0)
MCV: 92 fL (ref 80.0–100.0)
Monocytes Absolute: 0.8 10*3/uL (ref 0.1–1.0)
Monocytes Relative: 11 %
Neutro Abs: 3.6 10*3/uL (ref 1.7–7.7)
Neutrophils Relative %: 48 %
Platelets: 273 10*3/uL (ref 150–400)
RBC: 4.77 MIL/uL (ref 3.87–5.11)
RDW: 14 % (ref 11.5–15.5)
WBC: 7.3 10*3/uL (ref 4.0–10.5)
nRBC: 0 % (ref 0.0–0.2)

## 2022-11-22 LAB — BASIC METABOLIC PANEL
Anion gap: 14 (ref 5–15)
BUN: 11 mg/dL (ref 6–20)
CO2: 24 mmol/L (ref 22–32)
Calcium: 9.9 mg/dL (ref 8.9–10.3)
Chloride: 97 mmol/L — ABNORMAL LOW (ref 98–111)
Creatinine, Ser: 0.52 mg/dL (ref 0.44–1.00)
GFR, Estimated: >60 mL/min (ref >60)
Glucose, Bld: 93 mg/dL (ref 70–99)
Potassium: 3.2 mmol/L — ABNORMAL LOW (ref 3.5–5.1)
Sodium: 135 mmol/L (ref 135–145)

## 2022-11-22 LAB — MAGNESIUM: Magnesium: 1.9 mg/dL (ref 1.7–2.4)

## 2022-11-22 MED ORDER — UMECLIDINIUM BROMIDE 62.5 MCG/ACT IN AEPB
1.0000 | INHALATION_SPRAY | Freq: Every day | RESPIRATORY_TRACT | Status: DC
  Administered 2022-11-22 – 2022-11-24 (×3): 1 via RESPIRATORY_TRACT
  Filled 2022-11-22: qty 7

## 2022-11-22 MED ORDER — HYDROXYZINE HCL 25 MG PO TABS
25.0000 mg | ORAL_TABLET | Freq: Once | ORAL | Status: AC
  Administered 2022-11-22: 25 mg via ORAL
  Filled 2022-11-22: qty 1

## 2022-11-22 MED ORDER — POTASSIUM CHLORIDE CRYS ER 20 MEQ PO TBCR: 40.0000 meq | EXTENDED_RELEASE_TABLET | ORAL | Status: DC

## 2022-11-22 MED ORDER — SODIUM CHLORIDE 0.9 % IV SOLN: INTRAVENOUS | Status: AC

## 2022-11-22 MED ORDER — PEG-KCL-NACL-NASULF-NA ASC-C 100 G PO SOLR
0.5000 | Freq: Once | ORAL | Status: AC
  Administered 2022-11-23: 100 g via ORAL
  Filled 2022-11-22: qty 1

## 2022-11-22 MED ORDER — PEG-KCL-NACL-NASULF-NA ASC-C 100 G PO SOLR
0.5000 | Freq: Once | ORAL | Status: AC
  Administered 2022-11-22: 100 g via ORAL
  Filled 2022-11-22: qty 1

## 2022-11-22 MED ORDER — POTASSIUM CHLORIDE CRYS ER 20 MEQ PO TBCR
40.0000 meq | EXTENDED_RELEASE_TABLET | Freq: Every day | ORAL | Status: DC
  Administered 2022-11-22 – 2022-11-24 (×3): 40 meq via ORAL
  Filled 2022-11-22 (×3): qty 2

## 2022-11-22 MED ORDER — BOOST / RESOURCE BREEZE PO LIQD CUSTOM
1.0000 | Freq: Three times a day (TID) | ORAL | Status: DC
  Administered 2022-11-23 (×3): 1 via ORAL

## 2022-11-22 MED ORDER — MOMETASONE FURO-FORMOTEROL FUM 200-5 MCG/ACT IN AERO
2.0000 | INHALATION_SPRAY | Freq: Two times a day (BID) | RESPIRATORY_TRACT | Status: DC
  Administered 2022-11-22 – 2022-11-24 (×5): 2 via RESPIRATORY_TRACT
  Filled 2022-11-22: qty 8.8

## 2022-11-22 MED ORDER — PEG-KCL-NACL-NASULF-NA ASC-C 100 G PO SOLR: 1.0000 | Freq: Once | ORAL | Status: DC

## 2022-11-22 NOTE — Progress Notes (Signed)
HISTORY OF PRESENT ILLNESS:  April [name] is a 47 y.o. female with metastatic colon cancer admitted with intractable pain.  CT questioned rectosigmoid lesion.  Has biliary stents in place that are functioning well.Prior surgery (laparoscopic LAR).  She was scheduled for colonoscopy the other day, but had issues with blood pressure, tachycardia, chest pain.  Negative CTA of the chest.  Now feeling better in that regard.  Blood pressure and vital signs stable.  This morning, sitting in bed comfortably.  Still reports intermittent cramping discomfort in the lower abdomen.  REVIEW OF SYSTEMS:  All non-GI ROS negative. Past Medical History:  Diagnosis Date   Benign essential hypertension 10/06/2017   Last Assessment & Plan:  Formatting of this note might be different from the original. Patient's blood pressure noted to be elevated at today's visit 188/98.  Patient will be provided with antihypertensives in the treatment center if needed in order to meet her Avastin parameters.  Patient recommended to follow up with her primary care physician regarding medication management.  Patient is not cur   Cancer [facility])    Colon cancer (HCC)    Hypertension    Renal calculi    bilateral    Past Surgical History:  Procedure Laterality Date   BILIARY BRUSHING  03/24/2022   Procedure: BILIARY BRUSHING;  Surgeon: Meridee Score Netty Starring., MD;  Location: [facility] ENDOSCOPY;  Service: Gastroenterology;;   BILIARY DILATION  03/24/2022   Procedure: BILIARY DILATION;  Surgeon: Lemar Lofty., MD;  Location[facility]COPY;  Service: Gastroenterology;;   BILIARY STENT PLACEMENT  03/24/2022   Procedure: BILIARY STENT PLACEMENT;  Surgeon: Lemar Lofty., MD;  Location: [facility] ENDOSCOPY;  Service: Gastroenterology;;   BIOPSY  03/24/2022   Procedure: BIOPSY;  Surgeon: Lemar Lofty., MD;  Location: [facility] ENDOSCOPY;  Service: Gastroenterology;;   CESAREAN SECTION     x2   CYSTOSCOPY WITH RETROGRADE  PYELOGRAM, URETEROSCOPY AND STENT PLACEMENT Right 10/22/2020   Procedure: CYSTOSCOPY WITH RETROGRADE PYELOGRAM, URETEROSCOPY AND STENT PLACEMENT;  Surgeon: Noel Christmas, MD;  Location: [facility] ORS;  Service: Urology;  Laterality: Right;   ERCP N/A 03/24/2022   Procedure: ENDOSCOPIC RETROGRADE CHOLANGIOPANCREATOGRAPHY (ERCP);  Surgeon: Lemar Lofty., MD;  Location: [facility] ENDOSCOPY;  Service: Gastroenterology;  Laterality: N/A;   IR RADIOLOGIST EVAL & MGMT  09/16/2019   IR RADIOLOGIST EVAL & MGMT  09/23/2019   IR RADIOLOGIST EVAL & MGMT  09/20/2019   IR RADIOLOGIST EVAL & MGMT  09/30/2019   IR REMOVAL TUN ACCESS W/ PORT W/O FL MOD SED  09/14/2019   IR VENO/EXT/UNI RIGHT  04/11/2022   PANCREATIC STENT PLACEMENT  03/24/2022   Procedure: PANCREATIC STENT PLACEMENT;  Surgeon: Lemar Lofty., MD;  Location: [facility] ENDOSCOPY;  Service: Gastroenterology;;   REMOVAL OF STONES  03/24/2022   Procedure: REMOVAL OF STONES;  Surgeon: Lemar Lofty., MD;  Location: [facility] ENDOSCOPY;  Service: Gastroenterology;;   Dennison Mascot  03/24/2022   Procedure: Dennison Mascot;  Surgeon: Mansouraty, Netty Starring., MD;  Location: [facility] ENDOSCOPY;  Service: Gastroenterology;;   URETERAL STENT PLACEMENT      Social History [name]  reports that she has quit smoking. Her smoking use included cigarettes. She has never used smokeless tobacco. She reports that she does not currently use alcohol. She reports that she does not currently use drugs after having used the following drugs: Hydrocodone.  family history includes Diabetes Mellitus II in her father; Hypertension in her mother; Stroke in her father; Ulcers in her sister.  Allergies  Allergen Reactions   Lisinopril Swelling and Other (See Comments)    Lip swelling and Angioedema   Irinotecan Other (See Comments)    Stomach cramping Patient given 0.5 mL atropine IV and was able to continue infusion.  See Progress note on 10/03/20.    Leucovorin Calcium  Itching    Patient reported itchy palms towards end of transfusion, see progress note 08/04/22.    Oxaliplatin Itching    Patient reported itchy palms towards end of transfusion, see progress note on 08/04/22.       PHYSICAL EXAMINATION: Vital signs: BP (!) 130/96 (BP Location: Left Arm)   Pulse (!) 104   Temp 98.3 F (36.8 C) (Oral)   Resp 14   Ht 5\' 4"  (1.626 m)   Wt 64.9 kg   LMP 01/07/2006   SpO2 100%   BMI 24.55 kg/m   Constitutional: Thin and comfortable appearing, no acute distress Psychiatric: alert and oriented x3, cooperative Eyes: extraocular movements intact, anicteric, conjunctiva pink Mouth: oral pharynx moist, no lesions Neck: supple no lymphadenopathy Cardiovascular: heart regular rate and rhythm, no murmur Lungs: clear to auscultation bilaterally Abdomen: soft, nontender, nondistended, no obvious ascites, no peritoneal signs, normal bowel sounds, no organomegaly Rectal: Deferred until colonoscopy Extremities: no lower extremity edema bilaterally Skin: no lesions on visible extremities Neuro: No focal deficits.   ASSESSMENT:  1.  Metastatic colon cancer 2.  Abdominal pain 3.  Abnormal CT scan as previously described   PLAN:  1.  Diagnostic colonoscopy.  Will arrange for tomorrow morning.The nature of the procedure, as well as the risks, benefits, and alternatives were carefully and thoroughly reviewed with the patient. Ample time for discussion and questions allowed. The patient understood, was satisfied, and agreed to proceed.  Wilhemina Bonito. Eda Keys., M.D. [facility] Division of Gastroenterology

## 2022-11-22 NOTE — Progress Notes (Signed)
PROGRESS NOTE    Quetzalli Lafferty  ZOX:[id number] DOB: [birth year] DOA: 11/19/2022 PCP: Tollie Eth, NP    Chief Complaint  Patient presents with   Abdominal Pain    Brief Narrative:  Patient is a 47 year old female history of stage IV colorectal cancer with omental/peritoneal involvement on maintenance Xeloda, malignant hepatobiliary stricture status post ERCP and stenting, hypertension, nephrolithiasis, cancers associated pain admitted with abdominal and sacral pain and for management of cancer associated pain.   Assessment & Plan:   Principal Problem:   Cancer related pain Active Problems:   Hypertension   Hypokalemia   Colorectal cancer, stage IV (HCC)   Constipation   Abnormal CT of the abdomen  1 stage IV colon cancer with omental/peritoneal involvement/cancer associated pain/constipation -Patient noted to have presented worsening abdominal and rectal pain which was worse with bowel movements with associated bowel cramping. -Abdominal films done with paucity of bowel gas without definite evidence of enteric obstruction.  Bilateral nephrolithiasis. -CT abdomen and pelvis done negative for bowel obstruction however did show changes concerning for enlarging tumor starting along the left paracolic gutter and mildly worsening omental nodularity. -Patient noted to have bowel movement with some clinical improvement however still with abdominal cramping as of 11/20/2022. -Patient underwent bowel prep overnight states abdominal cramping and pain has improved.. -Pain currently controlled on current pain regiment. -Patient seen in consultation by hematology/oncology Dr. Truett Perna who reviewed abdominal films and recommended GI consultation to consider sigmoidoscopy to evaluate the rectum. -Patient seen in consultation by GI, who reviewed films, lab work, endoscopy results in the past and CT scans with concern for possible distal colon cancer and as such patient scheduled for  colonoscopy which was to be done on 11/21/2022 however due to patient's elevated blood pressure, tachycardia, complaints of shortness of breath colonoscopy was canceled and has been rescheduled for tomorrow.  -Continue MiraLAX twice daily, Senokot-S twice daily. -Patient to receive bowel prep. -Supportive care.   -GI following and appreciate their input and recommendations.   2.  Hypertension -BP improved with addition of Lopressor 50 mg p.o. twice daily.   -Continue Norvasc, chlorthalidone.   -IV hydralazine as needed.   3.  Hypokalemia -Magnesium at 1.9 -Potassium at 3.2 this morning. -Kdur 40 mEq daily.  4.  Constipation -Continue MiraLAX twice daily.   -Senokot-S twice daily.   -Patient underwent bowel prep for colonoscopy.  5.  Shortness of breath/tachycardia/chest pain -Patient noted to have some complaints of shortness of breath, chest pain and noted to be tachycardic with hypertension yesterday. -CT angiogram chest done was negative. -Cardiac workup negative. -Clinical improvement, resolution of symptoms. -Follow.    DVT prophylaxis: Lovenox Code Status: Full Family Communication: Updated patient.  No family at bedside. Disposition: TBD  Status is: Observation The patient remains OBS appropriate and will d/c before 2 midnights.   Consultants:  Hematology/oncology: Dr. Truett Perna GI: Dr. Marina Goodell 11/20/2022  Procedures:  CT abdomen and pelvis 11/19/2022 Abdominal x-ray 11/19/2022 CT angiogram chest 11/21/2022   Antimicrobials:  Anti-infectives (From admission, onward)    None         Subjective: Patient laying in bed.  Denies any shortness of breath or chest pain today.  States has some intermittent abdominal cramping have abdominal pain with significant improvement since admission.  Tolerating clear liquids.  Asking whether she is to undergo another bowel prep for colonoscopy tomorrow.    Objective: Vitals:   11/21/22 1927 11/21/22 2334 11/22/22 0323  11/22/22 1215  BP: (!) 156/120 (!) 124/91 Marland Kitchen)  130/96   Pulse: (!) 120 (!) 101 (!) 104   Resp:  14 14   Temp: 99 F (37.2 C) 98.5 F (36.9 C) 98.3 F (36.8 C)   TempSrc: Oral Oral Oral   SpO2: 100% 99% 100% 97%  Weight:      Height:        Intake/Output Summary (Last 24 hours) at 11/22/2022 1315 Last data filed at 11/21/2022 1651 Gross per 24 hour  Intake 727.88 ml  Output --  Net 727.88 ml    Filed Weights   11/19/22 1820  Weight: 64.9 kg    Examination:  General exam: NAD Respiratory system: CTAB.  No wheezes, no crackles, no rhonchi.  Fair air movement.  Speaking in full sentences.  Cardiovascular system: Regular rate rhythm no murmurs rubs or gallops.  No JVD.  No pitting lower extremity edema.  Gastrointestinal system: Abdomen soft, nontender, nondistended, positive bowel sounds.  No rebound.  No guarding.  Central nervous system: Alert and oriented. No focal neurological deficits. Extremities: Symmetric 5 x 5 power. Skin: No rashes, lesions or ulcers Psychiatry: Judgement and insight appear normal. Mood & affect appropriate.     Data Reviewed: I have personally reviewed following labs and imaging studies  CBC: Recent Labs  Lab 11/19/22 1619 11/20/22 0550 11/21/22 0557 11/22/22 0710  WBC 7.5 6.7 8.6 7.3  NEUTROABS 4.2  --   --  3.6  HGB 14.5 13.0 15.3* 14.8  HCT 43.0 38.4 45.0 43.9  MCV 91.7 92.3 92.8 92.0  PLT 285 242 291 273    Basic Metabolic Panel: Recent Labs  Lab 11/19/22 1619 11/20/22 0550 11/21/22 0557 11/22/22 0710  NA 139 137 138 135  K 3.1* 3.1* 3.5 3.2*  CL 98 100 101 97*  CO2 28 25 24 24   GLUCOSE 86 88 139* 93  BUN 7 7 9 11   CREATININE 0.57 0.50 0.58 0.52  CALCIUM 10.6* 9.1 10.3 9.9  MG  --  2.0 2.2 1.9  PHOS  --   --  4.0  --     GFR: Estimated Creatinine Clearance: 74.3 mL/min (by C-G formula based on SCr of 0.52 mg/dL).  Liver Function Tests: Recent Labs  Lab 11/19/22 1619 11/21/22 0557  AST 22 35  ALT 13 24   ALKPHOS 159* 172*  BILITOT 0.4 0.6  PROT 8.6* 8.9*  ALBUMIN 4.4 4.4  4.5    CBG: No results for input(s): "GLUCAP" in the last 168 hours.   No results found for this or any previous visit (from the past 240 hour(s)).       Radiology Studies: CT Angio Chest Pulmonary Embolism (PE) W or WO Contrast  Result Date: 11/21/2022 CLINICAL DATA:  High probability for PE. History of metastatic colon cancer. EXAM: CT ANGIOGRAPHY CHEST WITH CONTRAST TECHNIQUE: Multidetector CT imaging of the chest was performed using the standard protocol during bolus administration of intravenous contrast. Multiplanar CT image reconstructions and MIPs were obtained to evaluate the vascular anatomy. RADIATION DOSE REDUCTION: This exam was performed according to the departmental dose-optimization program which includes automated exposure control, adjustment of the mA and/or kV according to patient size and/or use of iterative reconstruction technique. CONTRAST:  75mL OMNIPAQUE IOHEXOL 350 MG/ML SOLN COMPARISON:  CT chest abdomen and pelvis 10/15/2022. CT abdomen and pelvis 11/19/2022. FINDINGS: Cardiovascular: Satisfactory opacification of the pulmonary arteries to the segmental level. No evidence of pulmonary embolism. Normal heart size. No pericardial effusion. Mediastinum/Nodes: No enlarged mediastinal, hilar, or axillary lymph nodes. Thyroid gland,  trachea, and esophagus demonstrate no significant findings. Lungs/Pleura: Emphysematous changes are similar to prior. There is no focal lung infiltrate, pleural effusion or pneumothorax. Upper Abdomen: Again seen are bile duct stents with intrahepatic biliary ductal dilatation and pneumobilia similar to prior. Stable subcentimeter rounded hypodensity in the left lobe of the liver. Ill-defined hypodense lesion in the left lobe of the liver image 4/96 measures 15 mm and was not definitely seen prior exam 2 days ago. Musculoskeletal: No chest wall abnormality. No acute or  significant osseous findings. Review of the MIP images confirms the above findings. IMPRESSION: 1. No evidence for pulmonary embolism or other acute cardiopulmonary process. 2. Stable emphysematous changes. 3. Stable bile duct stents with intrahepatic biliary ductal dilatation and pneumobilia. 4. New 15 mm hypodense lesion in the left lobe of the liver, indeterminate. This is not seen on 11/19/2022. Recommend clinical correlation and follow-up to exclude metastatic disease. Emphysema (ICD10-J43.9). Electronically Signed   By: Darliss Cheney M.D.   On: 11/21/2022 15:38        Scheduled Meds:  amLODipine  10 mg Oral Daily   chlorthalidone  25 mg Oral Daily   enoxaparin (LOVENOX) injection  40 mg Subcutaneous Q24H   feeding supplement  1 Container Oral TID BM   metoprolol tartrate  50 mg Oral BID   mometasone-formoterol  2 puff Inhalation BID   peg 3350 powder  0.5 kit Oral Once   And   [START ON 11/23/2022] peg 3350 powder  0.5 kit Oral Once   polyethylene glycol  17 g Oral BID   potassium chloride  40 mEq Oral Daily   senna-docusate  1 tablet Oral BID   umeclidinium bromide  1 puff Inhalation Daily   Continuous Infusions:   LOS: 1 day    Time spent: 35 minutes    Ramiro Harvest, MD [facility]   To contact the attending provider between 7A-7P or the covering provider during after hours 7P-7A, please log into the web site [email or web address] and access using universal [place] password for that web site. If you do not have the password, please call the hospital operator.  11/22/2022, 1:15 PM

## 2022-11-22 NOTE — H&P (View-Only) (Signed)
HISTORY OF PRESENT ILLNESS:  April [name] is a 47 y.o. female with metastatic colon cancer admitted with intractable pain.  CT questioned rectosigmoid lesion.  Has biliary stents in place that are functioning well.Prior surgery (laparoscopic LAR).  She was scheduled for colonoscopy the other day, but had issues with blood pressure, tachycardia, chest pain.  Negative CTA of the chest.  Now feeling better in that regard.  Blood pressure and vital signs stable.  This morning, sitting in bed comfortably.  Still reports intermittent cramping discomfort in the lower abdomen.  REVIEW OF SYSTEMS:  All non-GI ROS negative. Past Medical History:  Diagnosis Date   Benign essential hypertension 10/06/2017   Last Assessment & Plan:  Formatting of this note might be different from the original. Patient's blood pressure noted to be elevated at today's visit 188/98.  Patient will be provided with antihypertensives in the treatment center if needed in order to meet her Avastin parameters.  Patient recommended to follow up with her primary care physician regarding medication management.  Patient is not cur   Cancer [facility])    Colon cancer (HCC)    Hypertension    Renal calculi    bilateral    Past Surgical History:  Procedure Laterality Date   BILIARY BRUSHING  03/24/2022   Procedure: BILIARY BRUSHING;  Surgeon: Meridee Score Netty Starring., MD;  Location: [facility] ENDOSCOPY;  Service: Gastroenterology;;   BILIARY DILATION  03/24/2022   Procedure: BILIARY DILATION;  Surgeon: Lemar Lofty., MD;  Location: [facility] END[facility]COPY;  Service: Gastroenterology;;   BILIARY STENT PLACEMENT  03/24/2022   Procedure: BILIARY STENT PLACEMENT;  Surgeon: Lemar Lofty., MD;  Location: [facility] ENDOSCOPY;  Service: Gastroenterology;;   BIOPSY  03/24/2022   Procedure: BIOPSY;  Surgeon: Lemar Lofty., MD;  Location: [facility] ENDOSCOPY;  Service: Gastroenterology;;   CESAREAN SECTION     x2   CYSTOSCOPY WITH RETROGRADE  PYELOGRAM, URETEROSCOPY AND STENT PLACEMENT Right 10/22/2020   Procedure: CYSTOSCOPY WITH RETROGRADE PYELOGRAM, URETEROSCOPY AND STENT PLACEMENT;  Surgeon: Noel Christmas, MD;  Location: [facility] ORS;  Service: Urology;  Laterality: Right;   ERCP N/A 03/24/2022   Procedure: ENDOSCOPIC RETROGRADE CHOLANGIOPANCREATOGRAPHY (ERCP);  Surgeon: Lemar Lofty., MD;  Location: [facility] ENDOSCOPY;  Service: Gastroenterology;  Laterality: N/A;   IR RADIOLOGIST EVAL & MGMT  09/16/2019   IR RADIOLOGIST EVAL & MGMT  09/23/2019   IR RADIOLOGIST EVAL & MGMT  09/20/2019   IR RADIOLOGIST EVAL & MGMT  09/30/2019   IR REMOVAL TUN ACCESS W/ PORT W/O FL MOD SED  09/14/2019   IR VENO/EXT/UNI RIGHT  04/11/2022   PANCREATIC STENT PLACEMENT  03/24/2022   Procedure: PANCREATIC STENT PLACEMENT;  Surgeon: Lemar Lofty., MD;  Location: [facility] ENDOSCOPY;  Service: Gastroenterology;;   REMOVAL OF STONES  03/24/2022   Procedure: REMOVAL OF STONES;  Surgeon: Lemar Lofty., MD;  Location: [facility] ENDOSCOPY;  Service: Gastroenterology;;   Dennison Mascot  03/24/2022   Procedure: Dennison Mascot;  Surgeon: Mansouraty, Netty Starring., MD;  Location: [facility] ENDOSCOPY;  Service: Gastroenterology;;   URETERAL STENT PLACEMENT      Social History April [name]  reports that she has quit smoking. Her smoking use included cigarettes. She has never used smokeless tobacco. She reports that she does not currently use alcohol. She reports that she does not currently use drugs after having used the following drugs: Hydrocodone.  family history includes Diabetes Mellitus II in her father; Hypertension in her mother; Stroke in her father; Ulcers in her sister.  Allergies  Allergen Reactions   Lisinopril Swelling and Other (See Comments)    Lip swelling and Angioedema   Irinotecan Other (See Comments)    Stomach cramping Patient given 0.5 mL atropine IV and was able to continue infusion.  See Progress note on 10/03/20.    Leucovorin Calcium  Itching    Patient reported itchy palms towards end of transfusion, see progress note 08/04/22.    Oxaliplatin Itching    Patient reported itchy palms towards end of transfusion, see progress note on 08/04/22.       PHYSICAL EXAMINATION: Vital signs: BP (!) 130/96 (BP Location: Left Arm)   Pulse (!) 104   Temp 98.3 F (36.8 C) (Oral)   Resp 14   Ht 5\' 4"  (1.626 m)   Wt 64.9 kg   LMP 01/07/2006   SpO2 100%   BMI 24.55 kg/m   Constitutional: Thin and comfortable appearing, no acute distress Psychiatric: alert and oriented x3, cooperative Eyes: extraocular movements intact, anicteric, conjunctiva pink Mouth: oral pharynx moist, no lesions Neck: supple no lymphadenopathy Cardiovascular: heart regular rate and rhythm, no murmur Lungs: clear to auscultation bilaterally Abdomen: soft, nontender, nondistended, no obvious ascites, no peritoneal signs, normal bowel sounds, no organomegaly Rectal: Deferred until colonoscopy Extremities: no lower extremity edema bilaterally Skin: no lesions on visible extremities Neuro: No focal deficits.   ASSESSMENT:  1.  Metastatic colon cancer 2.  Abdominal pain 3.  Abnormal CT scan as previously described   PLAN:  1.  Diagnostic colonoscopy.  Will arrange for tomorrow morning.The nature of the procedure, as well as the risks, benefits, and alternatives were carefully and thoroughly reviewed with the patient. Ample time for discussion and questions allowed. The patient understood, was satisfied, and agreed to proceed.  Wilhemina Bonito. Eda Keys., M.D. [facility] Division of Gastroenterology

## 2022-11-23 ENCOUNTER — Encounter (HOSPITAL_COMMUNITY)
Admission: EM | Disposition: A | Discharge status: Home / Self Care | Payer: Self-pay | Source: Home / Self Care | Attending: Internal Medicine

## 2022-11-23 ENCOUNTER — Encounter (HOSPITAL_COMMUNITY): Payer: Self-pay | Admitting: Internal Medicine

## 2022-11-23 ENCOUNTER — Inpatient Hospital Stay (HOSPITAL_COMMUNITY): Payer: 59 | Admitting: Anesthesiology

## 2022-11-23 DIAGNOSIS — C210 Malignant neoplasm of anus, unspecified: Secondary | ICD-10-CM

## 2022-11-23 DIAGNOSIS — K621 Rectal polyp: Secondary | ICD-10-CM

## 2022-11-23 DIAGNOSIS — E876 Hypokalemia: Secondary | ICD-10-CM | POA: Diagnosis not present

## 2022-11-23 DIAGNOSIS — C19 Malignant neoplasm of rectosigmoid junction: Secondary | ICD-10-CM | POA: Diagnosis not present

## 2022-11-23 DIAGNOSIS — N189 Chronic kidney disease, unspecified: Secondary | ICD-10-CM

## 2022-11-23 DIAGNOSIS — G893 Neoplasm related pain (acute) (chronic): Secondary | ICD-10-CM | POA: Diagnosis not present

## 2022-11-23 DIAGNOSIS — I129 Hypertensive chronic kidney disease with stage 1 through stage 4 chronic kidney disease, or unspecified chronic kidney disease: Secondary | ICD-10-CM

## 2022-11-23 DIAGNOSIS — K5730 Diverticulosis of large intestine without perforation or abscess without bleeding: Secondary | ICD-10-CM

## 2022-11-23 DIAGNOSIS — R935 Abnormal findings on diagnostic imaging of other abdominal regions, including retroperitoneum: Secondary | ICD-10-CM | POA: Diagnosis not present

## 2022-11-23 HISTORY — PX: COLONOSCOPY: SHX5424

## 2022-11-23 HISTORY — PX: BIOPSY: SHX5522

## 2022-11-23 LAB — CBC
HCT: 43.1 % (ref 36.0–46.0)
Hemoglobin: 14 g/dL (ref 12.0–15.0)
MCH: 30.6 pg (ref 26.0–34.0)
MCHC: 32.5 g/dL (ref 30.0–36.0)
MCV: 94.3 fL (ref 80.0–100.0)
Platelets: 263 10*3/uL (ref 150–400)
RBC: 4.57 MIL/uL (ref 3.87–5.11)
RDW: 13.9 % (ref 11.5–15.5)
WBC: 6.3 10*3/uL (ref 4.0–10.5)
nRBC: 0 % (ref 0.0–0.2)

## 2022-11-23 LAB — BASIC METABOLIC PANEL
Anion gap: 11 (ref 5–15)
BUN: 15 mg/dL (ref 6–20)
CO2: 24 mmol/L (ref 22–32)
Calcium: 9.2 mg/dL (ref 8.9–10.3)
Chloride: 99 mmol/L (ref 98–111)
Creatinine, Ser: 0.58 mg/dL (ref 0.44–1.00)
GFR, Estimated: >60 mL/min (ref >60)
Glucose, Bld: 85 mg/dL (ref 70–99)
Potassium: 3.3 mmol/L — ABNORMAL LOW (ref 3.5–5.1)
Sodium: 134 mmol/L — ABNORMAL LOW (ref 135–145)

## 2022-11-23 SURGERY — COLONOSCOPY
Anesthesia: Monitor Anesthesia Care

## 2022-11-23 MED ORDER — PROPOFOL 10 MG/ML IV BOLUS
INTRAVENOUS | Status: AC
  Filled 2022-11-23: qty 20

## 2022-11-23 MED ORDER — FENTANYL CITRATE (PF) 100 MCG/2ML IJ SOLN
INTRAMUSCULAR | Status: DC | PRN
  Administered 2022-11-23: 25 ug via INTRAVENOUS

## 2022-11-23 MED ORDER — ESMOLOL HCL 100 MG/10ML IV SOLN
INTRAVENOUS | Status: DC | PRN
  Administered 2022-11-23: 10 mg via INTRAVENOUS

## 2022-11-23 MED ORDER — SODIUM CHLORIDE 0.9 % IV SOLN: INTRAVENOUS | Status: DC | PRN

## 2022-11-23 MED ORDER — PROPOFOL 1000 MG/100ML IV EMUL
INTRAVENOUS | Status: AC
  Filled 2022-11-23: qty 100

## 2022-11-23 MED ORDER — SODIUM CHLORIDE 0.9 % IV SOLN: INTRAVENOUS | Status: DC

## 2022-11-23 MED ORDER — FENTANYL CITRATE (PF) 100 MCG/2ML IJ SOLN
INTRAMUSCULAR | Status: AC
  Filled 2022-11-23: qty 2

## 2022-11-23 MED ORDER — PROPOFOL 10 MG/ML IV BOLUS
INTRAVENOUS | Status: DC | PRN
Start: 2022-11-23 — End: 2022-11-23
  Administered 2022-11-23: 80 mg via INTRAVENOUS

## 2022-11-23 MED ORDER — LIDOCAINE 2% (20 MG/ML) 5 ML SYRINGE
INTRAMUSCULAR | Status: DC | PRN
  Administered 2022-11-23: 60 mg via INTRAVENOUS

## 2022-11-23 MED ORDER — PROPOFOL 500 MG/50ML IV EMUL
INTRAVENOUS | Status: DC | PRN
  Administered 2022-11-23: 150 ug/kg/min via INTRAVENOUS

## 2022-11-23 NOTE — Anesthesia Preprocedure Evaluation (Addendum)
Anesthesia Evaluation  Patient identified by MRN, date of birth, ID band Patient awake    Reviewed: Allergy & Precautions, NPO status , Patient's Chart, lab work & pertinent test results  Airway Mallampati: II  TM Distance: >3 FB Neck ROM: Full    Dental  (+) Teeth Intact, Dental Advisory Given,    Pulmonary Patient abstained from smoking., former smoker   Pulmonary exam normal breath sounds clear to auscultation       Cardiovascular hypertension, Pt. on medications  Rhythm:Regular Rate:Tachycardia     Neuro/Psych  Headaches  negative psych ROS   GI/Hepatic Neg liver ROS,,,Stage IV colorectal cancer   Endo/Other  negative endocrine ROS    Renal/GU Renal disease  negative genitourinary   Musculoskeletal negative musculoskeletal ROS (+)    Abdominal   Peds  Hematology negative hematology ROS (+)   Anesthesia Other Findings Stage IV colon CA with biliary obstruction  Reproductive/Obstetrics                             Anesthesia Physical Anesthesia Plan  ASA: 4  Anesthesia Plan: MAC   Post-op Pain Management: Minimal or no pain anticipated   Induction: Intravenous  PONV Risk Score and Plan: 2 and Ondansetron, Treatment may vary due to age or medical condition and TIVA  Airway Management Planned: Natural Airway and Simple Face Mask  Additional Equipment:   Intra-op Plan:   Post-operative Plan: Extubation in OR  Informed Consent: I have reviewed the patients History and Physical, chart, labs and discussed the procedure including the risks, benefits and alternatives for the proposed anesthesia with the patient or authorized representative who has indicated his/her understanding and acceptance.     Dental advisory given  Plan Discussed with: CRNA  Anesthesia Plan Comments:        Anesthesia Quick Evaluation

## 2022-11-23 NOTE — Plan of Care (Signed)
  Problem: Education: Goal: Knowledge of General Education information will improve Description: Including pain rating scale, medication(s)/side effects and non-pharmacologic comfort measures 11/23/2022 0723 by Pauline Aus, RN Outcome: Not Progressing 11/23/2022 0723 by Pauline Aus, RN Outcome: Not Progressing   Problem: Health Behavior/Discharge Planning: Goal: Ability to manage health-related needs will improve 11/23/2022 0723 by Pauline Aus, RN Outcome: Not Progressing 11/23/2022 0723 by Pauline Aus, RN Outcome: Not Progressing   Problem: Clinical Measurements: Goal: Ability to maintain clinical measurements within normal limits will improve 11/23/2022 0723 by Pauline Aus, RN Outcome: Not Progressing 11/23/2022 0723 by Pauline Aus, RN Outcome: Not Progressing Goal: Will remain free from infection 11/23/2022 0723 by Pauline Aus, RN Outcome: Not Progressing 11/23/2022 0723 by Pauline Aus, RN Outcome: Not Progressing Goal: Diagnostic test results will improve 11/23/2022 0723 by Pauline Aus, RN Outcome: Not Progressing 11/23/2022 0723 by Pauline Aus, RN Outcome: Not Progressing Goal: Respiratory complications will improve 11/23/2022 0723 by Pauline Aus, RN Outcome: Not Progressing 11/23/2022 0723 by Pauline Aus, RN Outcome: Not Progressing Goal: Cardiovascular complication will be avoided 11/23/2022 0723 by Pauline Aus, RN Outcome: Not Progressing 11/23/2022 0723 by Pauline Aus, RN Outcome: Not Progressing   Problem: Activity: Goal: Risk for activity intolerance will decrease 11/23/2022 0723 by Pauline Aus, RN Outcome: Not Progressing 11/23/2022 0723 by Pauline Aus, RN Outcome: Not Progressing   Problem: Nutrition: Goal: Adequate nutrition will be maintained 11/23/2022 0723 by Pauline Aus, RN Outcome: Not Progressing 11/23/2022 0723 by Pauline Aus, RN Outcome: Not Progressing

## 2022-11-23 NOTE — Interval H&P Note (Signed)
History and Physical Interval Note:  11/23/2022 10:16 AM  April [name]  has presented today for surgery, with the diagnosis of rectal pain, history of colon cancer.  The various methods of treatment have been discussed with the patient and family. After consideration of risks, benefits and other options for treatment, the patient has consented to  Procedure(s): COLONOSCOPY (N/A) as a surgical intervention.  The patient's history has been reviewed, patient examined, no change in status, stable for surgery.  I have reviewed the patient's chart and labs.  Questions were answered to the patient's satisfaction.     Yancey Flemings

## 2022-11-23 NOTE — Progress Notes (Addendum)
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   Assessment & Plan:   Principal Problem:   Cancer related pain Active Problems:   Hypertension   Hypokalemia   Colorectal cancer, stage IV (HCC)   Constipation   Abnormal CT of the abdomen  1 stage IV colon cancer with omental/peritoneal involvement/cancer associated pain/constipation -Patient noted to have presented worsening abdominal and rectal pain which was worse with bowel movements with associated bowel cramping. -Abdominal films done with paucity of bowel gas without definite evidence of enteric obstruction.  Bilateral nephrolithiasis. -CT abdomen and pelvis done negative for bowel obstruction however did show changes concerning for enlarging tumor starting along the left paracolic gutter and mildly worsening omental nodularity. -Patient noted to have bowel movement with some clinical improvement however still with abdominal cramping as of 11/20/2022. -Patient underwent bowel prep states abdominal cramping and pain has improved although having intermittent abdominal cramping... -Pain currently controlled on current pain regiment. -Patient seen in consultation by hematology/oncology Dr. Truett Perna who reviewed abdominal films and recommended GI consultation to consider sigmoidoscopy to evaluate the rectum. -Patient seen in consultation by GI, who reviewed films, lab work, endoscopy results in the past and CT scans with concern for possible distal colon cancer and  as such patient scheduled for colonoscopy which was to be done on 11/21/2022 however due to patient's elevated blood pressure, tachycardia, complaints of shortness of breath colonoscopy was canceled and has been rescheduled for today. -Continue MiraLAX twice daily, Senokot-S twice daily. -Supportive care.   -GI following and appreciate their input and recommendations.   2.  Hypertension -BP improved with addition of Lopressor 50 mg p.o. twice daily.   -Continue Norvasc, chlorthalidone.   -IV hydralazine as needed.   3.  Hypokalemia -Magnesium at 1.9 -Potassium at 3.3 this morning. -Kdur 40 mEq daily.  4.  Constipation -Continue MiraLAX twice daily.   -Senokot-S twice daily.   -Patient underwent bowel prep for colonoscopy yesterday. -Will need a bowel regimen on discharge.  5.  Shortness of breath/tachycardia/chest pain -Patient noted to have some complaints of shortness of breath, chest pain and noted to be tachycardic with hypertension on 11/21/2022.  \ -Clinical improvement with resolution of symptoms.  -CT angiogram chest done was negative for PE however did show emphysematous changes.. -Cardiac workup negative. -Continue current bronchodilators and antihypertensive medications. -Follow.    DVT prophylaxis: Lovenox Code Status: Full Family Communication: Updated patient.  No family at bedside. Disposition: Home once clinically improved, cleared by hematology/oncology hopefully in the next 24 hours.  Status is: Inpatient    Consultants:  Hematology/oncology: Dr. Truett Perna GI: Dr. Marina Goodell 11/20/2022  Procedures:  CT abdomen and pelvis 11/19/2022 Abdominal x-ray 11/19/2022 CT angiogram chest 11/21/2022   Antimicrobials:  Anti-infectives (From admission, onward)    None         Subjective: Lying in bed watching television.  Still with some intermittent abdominal pain however states not as significant as on presentation.  Denies any shortness of breath.  No chest pain.   Overall feeling better than she did on admission.  Awaiting  colonoscopy today.    Objective: Vitals:   11/22/22 1922 11/22/22 1946 11/23/22 0431 11/23/22 0434  BP: (!) 127/101  (!) 137/107 (!) 135/96  Pulse: (!) 109  100 92  Resp: 20  20   Temp: 99 F (37.2 C)  97.6 F (36.4 C)   TempSrc: Oral  Oral   SpO2: 99% 98% 100%   Weight:      Height:        Intake/Output Summary (Last 24 hours) at 11/23/2022 0804 Last data filed at 11/23/2022 0300 Gross per 24 hour  Intake 600 ml  Output --  Net 600 ml    Filed Weights   11/19/22 1820  Weight: 64.9 kg    Examination:  General exam: NAD Respiratory system: Lungs clear to auscultation bilaterally.  No wheezes, no crackles, no rhonchi.  Fair air movement.  Speaking in full sentences.  Cardiovascular system: RRR no murmurs rubs or gallops.  No JVD.  No lower extremity edema.  Gastrointestinal system: Abdomen is soft, nontender, nondistended, positive bowel sounds.  No rebound.  No guarding.   Central nervous system: Alert and oriented. No focal neurological deficits. Extremities: Symmetric 5 x 5 power. Skin: No rashes, lesions or ulcers Psychiatry: Judgement and insight appear normal. Mood & affect appropriate.     Data Reviewed: I have personally reviewed following labs and imaging studies  CBC: Recent Labs  Lab 11/19/22 1619 11/20/22 0550 11/21/22 0557 11/22/22 0710 11/23/22 0653  WBC 7.5 6.7 8.6 7.3 6.3  NEUTROABS 4.2  --   --  3.6  --   HGB 14.5 13.0 15.3* 14.8 14.0  HCT 43.0 38.4 45.0 43.9 43.1  MCV 91.7 92.3 92.8 92.0 94.3  PLT 285 242 291 273 263    Basic Metabolic Panel: Recent Labs  Lab 11/19/22 1619 11/20/22 0550 11/21/22 0557 11/22/22 0710 11/23/22 0653  NA 139 137 138 135 134*  K 3.1* 3.1* 3.5 3.2* 3.3*  CL 98 100 101 97* 99  CO2 28 25 24 24 24   GLUCOSE 86 88 139* 93 85  BUN 7 7 9 11 15   CREATININE 0.57 0.50 0.58 0.52 0.58  CALCIUM 10.6* 9.1 10.3 9.9 9.2  MG  --  2.0 2.2 1.9  --   PHOS   --   --  4.0  --   --     GFR: Estimated Creatinine Clearance: 74.3 mL/min (by C-G formula based on SCr of 0.58 mg/dL).  Liver Function Tests: Recent Labs  Lab 11/19/22 1619 11/21/22 0557  AST 22 35  ALT 13 24  ALKPHOS 159* 172*  BILITOT 0.4 0.6  PROT 8.6* 8.9*  ALBUMIN 4.4 4.4  4.5    CBG: No results for input(s): "GLUCAP" in the last 168 hours.   No results found for this or any previous visit (from the past 240 hour(s)).       Radiology Studies: CT Angio Chest Pulmonary Embolism (PE) W or WO Contrast  Result Date: 11/21/2022 CLINICAL DATA:  High probability for PE. History of metastatic colon cancer. EXAM: CT ANGIOGRAPHY CHEST WITH CONTRAST TECHNIQUE: Multidetector CT imaging of the chest was performed using the standard protocol during bolus administration of intravenous contrast. Multiplanar CT image reconstructions and MIPs were obtained to evaluate the vascular anatomy. RADIATION DOSE REDUCTION: This exam was performed according to the departmental dose-optimization program which includes automated exposure control, adjustment of the mA and/or kV according to patient size and/or use of iterative reconstruction technique. CONTRAST:  75mL OMNIPAQUE IOHEXOL 350 MG/ML  SOLN COMPARISON:  CT chest abdomen and pelvis 10/15/2022. CT abdomen and pelvis 11/19/2022. FINDINGS: Cardiovascular: Satisfactory opacification of the pulmonary arteries to the segmental level. No evidence of pulmonary embolism. Normal heart size. No pericardial effusion. Mediastinum/Nodes: No enlarged mediastinal, hilar, or axillary lymph nodes. Thyroid gland, trachea, and esophagus demonstrate no significant findings. Lungs/Pleura: Emphysematous changes are similar to prior. There is no focal lung infiltrate, pleural effusion or pneumothorax. Upper Abdomen: Again seen are bile duct stents with intrahepatic biliary ductal dilatation and pneumobilia similar to prior. Stable subcentimeter rounded hypodensity in the  left lobe of the liver. Ill-defined hypodense lesion in the left lobe of the liver image 4/96 measures 15 mm and was not definitely seen prior exam 2 days ago. Musculoskeletal: No chest wall abnormality. No acute or significant osseous findings. Review of the MIP images confirms the above findings. IMPRESSION: 1. No evidence for pulmonary embolism or other acute cardiopulmonary process. 2. Stable emphysematous changes. 3. Stable bile duct stents with intrahepatic biliary ductal dilatation and pneumobilia. 4. New 15 mm hypodense lesion in the left lobe of the liver, indeterminate. This is not seen on 11/19/2022. Recommend clinical correlation and follow-up to exclude metastatic disease. Emphysema (ICD10-J43.9). Electronically Signed   By: Darliss Cheney M.D.   On: 11/21/2022 15:38        Scheduled Meds:  amLODipine  10 mg Oral Daily   chlorthalidone  25 mg Oral Daily   enoxaparin (LOVENOX) injection  40 mg Subcutaneous Q24H   feeding supplement  1 Container Oral TID BM   metoprolol tartrate  50 mg Oral BID   mometasone-formoterol  2 puff Inhalation BID   polyethylene glycol  17 g Oral BID   potassium chloride  40 mEq Oral Daily   senna-docusate  1 tablet Oral BID   umeclidinium bromide  1 puff Inhalation Daily   Continuous Infusions:  sodium chloride 100 mL/hr at 11/22/22 2136     LOS: 2 days    Time spent: 35 minutes    Ramiro Harvest, MD [facility]   To contact the attending provider between 7A-7P or the covering provider during after hours 7P-7A, please log into the web site [email or web address] and access using universal  password for that web site. If you do not have the password, please call the hospital operator.  11/23/2022, 8:04 AM

## 2022-11-23 NOTE — Plan of Care (Signed)
  Problem: Education: Goal: Knowledge of General Education information will improve Description: Including pain rating scale, medication(s)/side effects and non-pharmacologic comfort measures Outcome: Not Progressing   Problem: Clinical Measurements: Goal: Ability to maintain clinical measurements within normal limits will improve Outcome: Not Progressing Goal: Will remain free from infection Outcome: Not Progressing Goal: Diagnostic test results will improve Outcome: Not Progressing Goal: Respiratory complications will improve Outcome: Not Progressing Goal: Cardiovascular complication will be avoided Outcome: Not Progressing   Problem: Safety: Goal: Ability to remain free from injury will improve Outcome: Not Progressing   Problem: Skin Integrity: Goal: Risk for impaired skin integrity will decrease Outcome: Not Progressing

## 2022-11-23 NOTE — Op Note (Signed)
[facility] Patient Name: April [name] Procedure Date: 11/23/2022 MRN: [id number] Attending MD: Wilhemina Bonito. Marina Goodell , MD, [id number] Date of Birth: 1975-11-05 CSN: [id number] Age: 47 Admit Type: Inpatient Procedure:                Colonoscopy with biopsies Indications:              Generalized abdominal pain, Abnormal CT of the GI                            tract. History of metastatic colon cancer status                            post LAR Providers:                Wilhemina Bonito. Marina Goodell, MD, Eliberto Ivory, RN, Kandice Robinsons,                            Technician Referring MD:             [facility] hospitalist Medicines:                Monitored Anesthesia Care Complications:            No immediate complications. Estimated blood loss:                            None. Estimated Blood Loss:     Estimated blood loss: none. Procedure:                Pre-Anesthesia Assessment:                           - Prior to the procedure, a History and Physical                            was performed, and patient medications and                            allergies were reviewed. The patient's tolerance of                            previous anesthesia was also reviewed. The risks                            and benefits of the procedure and the sedation                            options and risks were discussed with the patient.                            All questions were answered, and informed consent                            was obtained. Prior Anticoagulants: The patient has  taken no anticoagulant or antiplatelet agents. ASA                            Grade Assessment: III - A patient with severe                            systemic disease. After reviewing the risks and                            benefits, the patient was deemed in satisfactory                            condition to undergo the procedure.                           After obtaining informed  consent, the colonoscope                            was passed under direct vision. Throughout the                            procedure, the patient's blood pressure, pulse, and                            oxygen saturations were monitored continuously. The                            CF-HQ190L ([id number]) Olympus colonoscope was                            introduced through the anus and advanced to the the                            cecum, identified by appendiceal orifice and                            ileocecal valve. The ileocecal valve, appendiceal                            orifice, and rectum were photographed. The quality                            of the bowel preparation was good. The colonoscopy                            was performed without difficulty. The patient                            tolerated the procedure well. The bowel preparation                            used was MoviPrep via split dose instruction. Scope In: 10:29:11 AM Scope Out: 10:39:44 AM Scope Withdrawal Time: 0 hours 6 minutes 6 seconds  Total Procedure  Duration: 0 hours 10 minutes 33 seconds  Findings:      The surgical anastomosis was located at approximately 15 cm from the       anal verge. This was patent. Eccentric polypoid appearing tissue,       measuring about 2 cm, was found at the anastomosis and biopsy.Marland Kitchen Biopsies       were taken with a cold forceps for histology.      A few diverticula were found in the transverse colon.      The exam was otherwise without abnormality on direct and retroflexion       views. Impression:               1. Status post LAR with patent anastomosis                           2. Small area of neoplastic appearing tissue at the                            anastomosis. Biopsied. This was not causing                            obstruction or other issues.                           3. Reticulosis                           4. Otherwise normal exam                            5. No cause for pain found on colonoscopy. Moderate Sedation:      none Recommendation:           1. Repeat colonoscopy is not recommended for                            surveillance.                           2. Return to the inpatient ward.                           3. Resume previous diet.                           4. Continue present medications.                           5. Await pathology results.                           These findings were shared with the patient. She                            was provided a copy of this report. GI will sign                            off. Thank you Procedure  Code(s):        --- Professional ---                           [phone], Colonoscopy, flexible; with biopsy, single                            or multiple Diagnosis Code(s):        --- Professional ---                           D12.8, Benign neoplasm of rectum                           R10.84, Generalized abdominal pain                           K57.30, Diverticulosis of large intestine without                            perforation or abscess without bleeding                           R93.3, Abnormal findings on diagnostic imaging of                            other parts of digestive tract CPT copyright 2022 [employer]. All rights reserved. The codes documented in this report are preliminary and upon coder review may  be revised to meet current compliance requirements. Wilhemina Bonito. Marina Goodell, MD 11/23/2022 10:56:33 AM This report has been signed electronically. Number of Addenda: 0

## 2022-11-23 NOTE — Anesthesia Postprocedure Evaluation (Signed)
Anesthesia Post Note  Patient: April [name]  Procedure(s) Performed: COLONOSCOPY BIOPSY     Patient location during evaluation: Endoscopy Anesthesia Type: MAC Level of consciousness: oriented, awake and alert and awake Pain management: pain level controlled Vital Signs Assessment: post-procedure vital signs reviewed and stable Respiratory status: spontaneous breathing, nonlabored ventilation, respiratory function stable and patient connected to nasal cannula oxygen Cardiovascular status: blood pressure returned to baseline, stable and tachycardic Postop Assessment: no headache, no backache and no apparent nausea or vomiting Anesthetic complications: no   No notable events documented.  Last Vitals:  Vitals:   11/23/22 1049 11/23/22 1052  BP: (!) 137/110   Pulse: (!) 101   Resp: 20 18  Temp:    SpO2: 99%     Last Pain:  Vitals:   11/23/22 1052  TempSrc:   PainSc: 7                  Collene Schlichter

## 2022-11-23 NOTE — Transfer of Care (Addendum)
Immediate Anesthesia Transfer of Care Note  Patient: April [name]  Procedure(s) Performed: COLONOSCOPY BIOPSY  Patient Location: Endoscopy Unit  Anesthesia Type:MAC  Level of Consciousness: drowsy and patient cooperative  Airway & Oxygen Therapy: Patient Spontanous Breathing and Patient connected to face mask oxygen  Post-op Assessment: Report given to RN and Post -op Vital signs reviewed and stable  Post vital signs: Reviewed and stable  Last Vitals:  Vitals Value Taken Time  BP 161/102 11/23/22   1100  Temp    Pulse 114 11/23/22   1100  Resp 21 11/23/22   1100  SpO2 100% 11/23/22   1100    Last Pain:  Vitals:   11/23/22 0932  TempSrc: Temporal  PainSc: 4       Patients Stated Pain Goal: 0 (11/21/22 1400)  Complications: No notable events documented.

## 2022-11-24 ENCOUNTER — Other Ambulatory Visit: Payer: Self-pay

## 2022-11-24 ENCOUNTER — Encounter (HOSPITAL_COMMUNITY): Payer: Self-pay | Admitting: Internal Medicine

## 2022-11-24 ENCOUNTER — Other Ambulatory Visit (HOSPITAL_COMMUNITY): Payer: Self-pay

## 2022-11-24 ENCOUNTER — Encounter: Payer: Self-pay | Admitting: Oncology

## 2022-11-24 DIAGNOSIS — K5730 Diverticulosis of large intestine without perforation or abscess without bleeding: Secondary | ICD-10-CM

## 2022-11-24 DIAGNOSIS — R935 Abnormal findings on diagnostic imaging of other abdominal regions, including retroperitoneum: Secondary | ICD-10-CM | POA: Diagnosis not present

## 2022-11-24 DIAGNOSIS — G893 Neoplasm related pain (acute) (chronic): Secondary | ICD-10-CM | POA: Diagnosis not present

## 2022-11-24 DIAGNOSIS — K5900 Constipation, unspecified: Secondary | ICD-10-CM | POA: Diagnosis not present

## 2022-11-24 DIAGNOSIS — C19 Malignant neoplasm of rectosigmoid junction: Secondary | ICD-10-CM | POA: Diagnosis not present

## 2022-11-24 LAB — CBC
HCT: 42 % (ref 36.0–46.0)
Hemoglobin: 13.6 g/dL (ref 12.0–15.0)
MCH: 30.8 pg (ref 26.0–34.0)
MCHC: 32.4 g/dL (ref 30.0–36.0)
MCV: 95.2 fL (ref 80.0–100.0)
Platelets: 262 10*3/uL (ref 150–400)
RBC: 4.41 MIL/uL (ref 3.87–5.11)
RDW: 13.5 % (ref 11.5–15.5)
WBC: 6 10*3/uL (ref 4.0–10.5)
nRBC: 0 % (ref 0.0–0.2)

## 2022-11-24 LAB — BASIC METABOLIC PANEL
Anion gap: 12 (ref 5–15)
BUN: 12 mg/dL (ref 6–20)
CO2: 22 mmol/L (ref 22–32)
Calcium: 9.3 mg/dL (ref 8.9–10.3)
Chloride: 98 mmol/L (ref 98–111)
Creatinine, Ser: 0.5 mg/dL (ref 0.44–1.00)
GFR, Estimated: >60 mL/min (ref >60)
Glucose, Bld: 93 mg/dL (ref 70–99)
Potassium: 3.4 mmol/L — ABNORMAL LOW (ref 3.5–5.1)
Sodium: 132 mmol/L — ABNORMAL LOW (ref 135–145)

## 2022-11-24 LAB — MAGNESIUM: Magnesium: 2 mg/dL (ref 1.7–2.4)

## 2022-11-24 MED ORDER — DICYCLOMINE HCL 20 MG PO TABS
20.0000 mg | ORAL_TABLET | Freq: Three times a day (TID) | ORAL | 1 refills | Status: DC | PRN
  Filled 2022-11-24: qty 30, 10d supply, fill #0
  Filled 2022-12-20: qty 30, 10d supply, fill #1

## 2022-11-24 MED ORDER — METOPROLOL TARTRATE 50 MG PO TABS
50.0000 mg | ORAL_TABLET | Freq: Two times a day (BID) | ORAL | 0 refills | Status: DC
  Filled 2022-11-24: qty 180, 90d supply, fill #0

## 2022-11-24 MED ORDER — MOMETASONE FURO-FORMOTEROL FUM 200-5 MCG/ACT IN AERO
2.0000 | INHALATION_SPRAY | Freq: Two times a day (BID) | RESPIRATORY_TRACT | 0 refills | Status: DC
  Filled 2022-11-24 (×2): qty 13, 30d supply, fill #0

## 2022-11-24 MED ORDER — UMECLIDINIUM BROMIDE 62.5 MCG/ACT IN AEPB
1.0000 | INHALATION_SPRAY | Freq: Every day | RESPIRATORY_TRACT | 1 refills | Status: DC
  Filled 2022-11-24 (×2): qty 30, 30d supply, fill #0
  Filled 2022-12-20: qty 30, 30d supply, fill #1

## 2022-11-24 MED ORDER — POLYETHYLENE GLYCOL 3350 17 GM/SCOOP PO POWD
17.0000 g | Freq: Two times a day (BID) | ORAL | 0 refills | Status: DC
  Filled 2022-11-24: qty 238, 7d supply, fill #0

## 2022-11-24 MED ORDER — SENNOSIDES-DOCUSATE SODIUM 8.6-50 MG PO TABS: 1.0000 | ORAL_TABLET | Freq: Two times a day (BID) | ORAL | Status: DC

## 2022-11-24 MED ORDER — CHLORTHALIDONE 25 MG PO TABS
25.0000 mg | ORAL_TABLET | Freq: Every day | ORAL | 0 refills | Status: DC
  Filled 2022-11-24: qty 90, 90d supply, fill #0

## 2022-11-24 MED ORDER — AMLODIPINE BESYLATE 10 MG PO TABS
10.0000 mg | ORAL_TABLET | Freq: Every day | ORAL | 0 refills | Status: DC
  Filled 2022-11-24: qty 90, 90d supply, fill #0

## 2022-11-24 MED ORDER — POTASSIUM CHLORIDE ER 10 MEQ PO CPCR
20.0000 meq | ORAL_CAPSULE | Freq: Two times a day (BID) | ORAL | 2 refills | Status: DC
  Filled 2022-11-24: qty 120, 30d supply, fill #0
  Filled 2022-12-20: qty 120, 30d supply, fill #1

## 2022-11-24 NOTE — Progress Notes (Signed)
IP PROGRESS NOTE  Subjective:   [name] continues to have abdominal cramping.  No nausea.  She underwent a colonoscopy yesterday.  Objective: Vital signs in last 24 hours: Blood pressure 114/89, pulse 98, temperature 98.1 F (36.7 C), temperature source Oral, resp. rate 18, height 5\' 4"  (1.626 m), weight 142 lb 15.9 oz (64.9 kg), last menstrual period 01/07/2006, SpO2 99%.  Intake/Output from previous day: 11/10 0701 - 11/11 0700 In: 1590 [P.O.:840; I.V.:750] Out: -   Physical Exam:  HEENT: No thrush or ulcers  Abdomen: Soft and nontender, no mass, no hepatosplenomegaly Extremities: No leg edema   Lab Results: Recent Labs    11/23/22 0653 11/24/22 0521  WBC 6.3 6.0  HGB 14.0 13.6  HCT 43.1 42.0  PLT 263 262    BMET Recent Labs    11/23/22 0653 11/24/22 0521  NA 134* 132*  K 3.3* 3.4*  CL 99 98  CO2 24 22  GLUCOSE 85 93  BUN 15 12  CREATININE 0.58 0.50  CALCIUM 9.2 9.3    Lab Results  Component Value Date   CEA1 <0.6 03/23/2022   CEA 3.47 11/13/2022   CAN199 213 (H) 03/23/2022    Studies/Results: No results found.  Medications: I have reviewed the patient's current medications.  Assessment/Plan:  Sigmoid colon cancer, stage IV (pT4a,pN2b,M1c) Colonoscopy 03/24/2019-3 rectal polyps-hyperplastic polyps, distal colon biopsy-at least intramucosal adenocarcinoma, completely obstructing mass in the distal sigmoid colon, could not be traversed, intact mismatch repair protein expression 03/24/2019-CEA 56.1 03/29/2019 CT abdomen/pelvis-circumferential thickening involving the entire mid and distal sigmoid colon to the level of the rectosigmoid junction, solid/cystic mass in the left ovary, bilateral nephrolithiasis Robotic assisted low anterior resection, mesenteric lymphadenectomy, bilateral salpingo-oophorectomy 04/08/2019 Pathology ([facility] review of outside pathology) metastatic adenocarcinoma involving the peritoneum overlying the round ligament, serosa  of the urinary bladder, serosa of the right ureter a sacral area, pelvic peritoneum, and left ovary.  Omental biopsy with focal mucin pools with no carcinoma cells identified, right hemidiaphragm biopsy involved by metastatic adenocarcinoma, invasive adenocarcinoma the sigmoid colon, moderately differentiated, T4a, perineural and vascular invasion present, 7/22 lymph nodes, multiple tumor deposits, resection margins negative Negative for PD-L1, low probability of MSI-high, HER-2 negative, negative for BRAF, NRAS and KRAS alterations CTs 05/19/2019-no evidence of metastatic disease, findings suspicious for colitis of the transverse and ascending colon, small amount of ascites in the cul-de-sac, bilateral renal calculi Cycle 1 FOLFIRI 05/24/2019, bevacizumab added with cycle 2 Cycle 4 FOLFIRI/bevacizumab 07/05/2019 Cycle 5 FOLFIRI 07/27/2019, bevacizumab held secondary to hypertension Cycle 6 FOLFIRI 08/10/1999, bevacizumab held secondary to hypertension CTs 08/30/2019-no evidence of recurrent disease, new subsolid right lower lobe nodule felt to be inflammatory, emphysema Cycle 7 FOLFIRI 09/26/2019, bevacizumab remains on hold secondary to hypertension Cycle 8 FOLFIRI 10/17/2019, bevacizumab held secondary to hypertension, Udenyca added for neutropenia Cycle 9 FOLFIRI 11/07/2019, bevacizumab held secondary to hypertension, Udenyca Cycle 10 FOLFIRI 11/28/2019, bevacizumab held, Udenyca Cycle 11 FOLFIRI 12/26/2019, bevacizumab held, Udenyca CTs 01/25/2020-no evidence of recurrent disease, resolution of right lower lobe nodule Maintenance Xeloda beginning 02/06/2020 CTs 04/25/2020- no evidence of metastatic disease, multiple bilateral renal calculi without hydronephrosis Maintenance Xeloda continued CT abdomen/pelvis without contrast 08/10/2020-bilateral staghorn renal calculi, no evidence of metastatic disease; addendum 08/23/2020-small but increasing omental nodules. Cycle 1 FOLFIRI/panitumumab 09/19/2020 Cycle 2  FOLFIRI/Panitumumab 10/03/2020 Cycle 3 FOLFIRI/Panitumumab 10/17/2020 Cycle 4 FOLFIRI/panitumumab 10/31/2020 Cycle 5 FOLFIRI/Panitumumab 11/14/2020 CTs 11/26/2020-stable omental metastases compared to 10/22/2020, mildly decreased from 08/10/2020.  Left lower quadrant tiny paracolic gutter implant slightly  decreased from CT 08/10/2020.  No new or progressive metastatic disease in the abdomen or pelvis. Cycle 6 FOLFIRI/Panitumumab 11/28/2020, irinotecan dose reduced, treatment schedule adjusted to every 3 weeks going forward Cycle 7 FOLFIRI/panitumumab 12/24/2020 Cycle 8 FOLFIRI/Panitumumab 01/16/2021--treatment held due to hypertension in the infusion area. Cycle 8 FOLFIRI/panitumumab 01/21/2021 Cycle 9 FOLFIRI/panitumumab 02/11/2021 Cycle 10 FOLFIRI/Panitumumab 03/04/2021 Cycle 11 FOLFIRI/Panitumumab 03/25/2021 CT abdomen/pelvis 04/10/2021-stable right omental and left posterior paracolic gutter nodules Cycle 12 FOLFIRI/panitumumab 04/15/2021 Cycle 13 FOLFIRI/panitumumab 05/06/2021 Cycle 14 FOLFIRI/Panitumumab 05/27/2021 Cycle 15 FOLFIRI/Panitumumab 06/17/2021 CT abdomen/pelvis 07/05/2021-slight enlargement of a dominant right omental implant, no new implants Patient requested a treatment break CT abdomen/pelvis 10/04/2021-mild increase in size of multiple omental soft tissue nodules, 4 mm calculus in the distal left ureter adjacent to the ureteral stent Cycle 1 Lonsurf 10/14/2021 10/28/2021 Avastin every 2 weeks Cycle 2 Lonsurf 11/11/2021 Cycle 3 Lonsurf 12/09/2021 Cycle 4 Lonsurf 01/06/2022 Avastin held 01/20/2022 due to proteinuria, 24-hour urine 43 mg CT/pelvis 01/30/2022-possible new peritoneal implant at the transverse colon, no significant change in other omental/peritoneal implants, stable chronic rectal wall thickening, status post removal of double-J left ureteral stent with no evidence of hydronephrosis or ureteral calculus, nonobstructing bilateral renal calculi Cycle 5 Lonsurf 02/05/2022 or  02/06/2022 Avastin every 2 weeks Cycle 6 Lonsurf 03/03/2022 CTs 04/05/2022-increase in size and number of peritoneal implants compared to January 2024 central liver mass narrowing the anterior branch of the right portal vein, decreased biliary duct dilation, biliary stents in place, stable right cardiophrenic lymph nodes, separate pigtail stent in the lumen of the proximal duodenum Cycle 1 FOLFOX 04/14/2022 CT abdomen/pelvis 04/16/2022-stent within the third portion of the duodenum, stable peritoneal nodules, indwelling biliary stents with mild left biliary duct dilation Cycle 2 FOLFOX 04/28/2022, Emend and prophylactic dexamethasone added Cycle 3 FOLFOX 05/12/2022, Aloxi, Emend, prophylactic dexamethasone, Compazine and lorazepam as needed Chemotherapy held 05/25/2020 due to severe hypertension, evaluated in the emergency department Cycle 4 FOLFOX 06/03/2022 Cycle 5 FOLFOX 06/16/2022, oxaliplatin dose reduced and 5-FU bolus eliminated due to neutropenia CT 06/25/2022-unchanged soft tissue at the hepatic hilum, unchanged, bile duct stent, decreased size of peritoneal and omental nodules and diminished volume of likely fluid in the lower left pelvis Cycle 6 FOLFOX 06/30/2022 Cycle 7 FOLFOX 07/21/2022 CT 07/24/2022 (in the emergency department with nausea, headache, abdominal pain)-no acute pathology.  No significant interval change in omental nodularity. Cycle 8 FOLFOX 08/04/2022, pruritus on the palms at the end of the oxaliplatin infusion, Pepcid and Benadryl given, symptoms resolved, infusion not resumed Cycle 9 FOLFOX 08/18/2022-Pepcid and Benadryl added to premedications, Oxaliplatin diluted in a larger volume and infusion time increased Cycle 10 FOLFOX 09/08/2022 Cycle 11 FOLFOX 09/22/2022 10/06/2022 patient declined treatment CTs 10/15/2022-similar soft tissue fullness in the hepatic hilum with biliary stents in place, decrease in omental/peritoneal metastases Maintenance capecitabine 10/26/2022 CT abdomen/pelvis  11/19/2022-mild increase in tumor studding at the left paracolic gutter and omentum, circumferential wall thickening of the rectum, biliary stents in place with minimal increase in intrahepatic biliary dilation, nonobstructive renal calculi Colonoscopy 11/23/2022-anastomosis at 15 cm from the anal verge-patent, 2 cm polypoid tissue at the anastomosis biopsied,   Hypertension G4 P3, twins Kidney stones-bilateral staghorn renal calculi on CT 08/10/2020 Infected Port-A-Cath 09/12/2019-placed on Augmentin, referred for Port-A-Cath removal; Port-A-Cath removed 09/14/2019; PICC line placed 09/14/2019; culture staph aureus.  Course of Septra completed. Neutropenia secondary to chemotherapy-Udenyca added with cycle 8 FOLFIRI Right nephrostomy tube 09/12/2020; stent placed 10/09/2020, percutaneous nephrostolithotomy treatment of right-sided kidney stones 10/09/2020, malpositioned right ureter stent  replacement 10/23/2020, right ureter stent removed 10/29/2020 Percutaneous left nephrostolithotomy 10/01/2021-no renal stone identified, left ureter stent left in place Cystoscopy 10/14/2021-stent removed 8.  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  Admission 04/16/2022 with nausea/vomiting and abdominal pain, felt to be acute toxicity related to chemotherapy, discharged home 04/18/2022 10.  Admission 11/19/2022 with nausea/vomiting and abdominal pain-improved     Ms. Husmann has metastatic colon cancer, currently treated with maintenance  capecitabine.  She was admitted the 11/19/2022 with nausea/vomiting, abdominal pain, and constipation.  Her symptoms improved after she had multiple bowel movements.  A colonoscopy 11/23/2022 revealed a small area consistent with recurrent tumor at the surgical anastomosis, no obstruction or explanation for pain.  A CT abdomen/pelvis revealed minimal evidence of disease progression involving peritoneal/omental nodules.  I discussed the CT findings with her.  Capecitabine will be discontinued.  We will need to consider changing to a different systemic treatment regimen.  We can consider repeat treatment with panitumumab based therapy as she has not received panitumumab in 1.5 years.  We can consider fruquintinib.  I will refer her to [facility] check eligibility for a clinical trial.  She has seen Dr. Rod Mae in the past.  Recommendations: Discontinue capecitabine 2.  Continue bowel regimen 3.  Antihypertensives 4.  Hydrocodone as needed for pain-I refilled outpatient prescription 5.  Outpatient follow-up as scheduled at the cancer center    LOS: 3 days   Thornton Papas, MD   11/24/2022, 7:44 AM

## 2022-11-24 NOTE — Discharge Summary (Signed)
Physician Discharge Summary  Rashi Lodes ZOX:[id number] DOB: 04/12/1975 DOA: 11/19/2022  PCP: Tollie Eth, NP  Admit date: 11/19/2022 Discharge date: 11/24/2022  Time spent: 60 minutes  Recommendations for Outpatient Follow-up:  Follow-up with Early, Sung Amabile, NP in 2 weeks.  On follow-up patient will need a basic metabolic profile done to follow-up on electrolytes and renal function.  Patient's blood pressure need to be reassessed. Follow-up with Dr. Truett Perna, oncology as scheduled.   Discharge Diagnoses:  Principal Problem:   Cancer related pain Active Problems:   Hypertension   Hypokalemia   Colorectal cancer, stage IV (HCC)   Constipation   Abnormal CT of the abdomen   Rectal polyp   Diverticulosis of colon without hemorrhage   Discharge Condition: Stable and improved.  Diet recommendation: Regular  Filed Weights   11/19/22 1820 11/23/22 0932  Weight: 64.9 kg 64.9 kg    History of present illness:  HPI per Dr. Bryn Gulling is a 47 y.o. 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   Patient states that over the last 2 weeks she has been having frequent abdominal pain and cramping sensation, worse during bowel movements.  She had been feeling constipated and was taking laxatives without much relief.  She says over the last 2 days she has been having loose stools and these are associated with worsening abdominal and rectal pain.  Last night she developed nausea and vomiting.  She was seen in the cancer clinic this morning and had recurrent episode of emesis while in office.  There was concern for potential bowel obstruction therefore she was sent to the ED for further evaluation.   [facility] ED  Course  Labs/Imaging on admission: I have personally reviewed following labs and imaging studies.   Initial vitals showed BP 142/89, pulse 91, RR 18, temp 98.2 F, SpO2 100% on room air.   Labs showed WBC 7.5, hemoglobin 14.5, platelets 285,000, sodium 139, potassium 3.1, bicarb 28, BUN 7, creatinine 0.57, serum glucose 86, lipase 37.   CT abdomen/pelvis with contrast showed worsening tumor studding along the left paracolic gutter, mildly worsening omental nodularity.  Substantial circumferential wall thickening in the rectum noted, residual tumor not excluded.  2 biliary stents remain in place with dilated and thick-walled right and left hepatic ducts with associated surrounding hypoenhancement similar to the 10/15/22 exam.   Patient was given 500 mL NS, IV K10 mEq x 1, IV Dilaudid 1 mg x 2, Zofran, amlodipine.  The hospitalist service was consulted to admit for further evaluation and management.  Hospital Course:  1 stage IV colon cancer with omental/peritoneal involvement/cancer associated pain/constipation -Patient noted to have presented worsening abdominal and rectal pain which was worse with bowel movements with associated bowel cramping. -Abdominal films done with paucity of bowel gas without definite evidence of enteric obstruction.  Bilateral nephrolithiasis. -CT abdomen and pelvis done negative for bowel obstruction however did show changes concerning for enlarging tumor starting along the left paracolic gutter and mildly worsening omental nodularity. -Patient noted to have bowel movement with some clinical improvement however still with abdominal cramping as of 11/20/2022. -Patient underwent bowel prep states abdominal cramping and pain has improved although having intermittent abdominal cramping... -Pain currently controlled on current pain regiment. -Patient seen in consultation by hematology/oncology Dr. Truett Perna who reviewed  abdominal films and recommended GI consultation to consider  sigmoidoscopy to evaluate the rectum. -Patient seen in consultation by GI, who reviewed films, lab work, endoscopy results in the past and CT scans with concern for possible distal colon cancer and as such patient scheduled for colonoscopy which was to be done on 11/21/2022 however due to patient's elevated blood pressure, tachycardia, complaints of shortness of breath colonoscopy was canceled and subsequently rescheduled and done on 11/23/2022 which revealed a small area consistent with recurrent tumor at the surgical anastomosis, no obstruction or explanation for patient's pain.   -Patient maintained on a bowel regimen of MiraLAX twice daily as well as Senokot-S twice daily.   -Patient improved clinically and symptomatically with some intermittent abdominal cramping.   -Patient be discharged home on Bentyl as needed, as well as hydrocodone as needed.  -Oncology recommending discontinuation of capecitabine, continuation of bowel regimen and antihypertensive medications with outpatient follow-up at the cancer center as scheduled.   -Patient will be discharged in stable and improved condition.    2.  Hypertension -Patient noted to have elevated blood pressure early on during the hospitalization.   -Patient initially maintained on home regimen Norvasc, home regimen chlorthalidone was subsequently added and given daily as per med rec he was noted patient was taking it weekly.   -Lopressor was added to patient's regimen and uptitrated to 50 mg twice daily with better blood pressure control.   -Outpatient follow-up with PCP.    3.  Hypokalemia -Repleted during the hospitalization.   -Patient noted to be on daily oral potassium supplementation.    4.  Constipation -Patient maintained on MiraLAX twice daily as well as Senokot-S twice daily.  -Patient underwent bowel prep for colonoscopy.   -Patient will be discharged on a bowel regimen in stable and improved condition.  -Outpatient follow-up with PCP.    5.  Shortness of breath/tachycardia/chest pain -Patient noted to have some complaints of shortness of breath, chest pain and noted to be tachycardic with hypertension on 11/21/2022.  \ -Clinical improvement with resolution of symptoms.  -CT angiogram chest done was negative for PE however did show emphysematous changes.. -Cardiac workup negative. -Patient placed on bronchodilators, blood pressure better managed on antihypertensive medications with improvement and resolution of symptoms.  -Outpatient follow-up with PCP.     Procedures: CT abdomen and pelvis 11/19/2022 Abdominal x-ray 11/19/2022 CT angiogram chest 11/21/2022 Colonoscopy per Dr. Marina Goodell 11/23/2022  Consultations: Hematology/oncology: Dr. Truett Perna GI: Dr. Marina Goodell 11/20/2022  Discharge Exam: Vitals:   11/24/22 0803 11/24/22 0808  BP:    Pulse:    Resp:    Temp:    SpO2: 97% 97%    General: NAD Cardiovascular: RRR no murmurs rubs or gallops.  No JVD.  No lower extremity edema. Respiratory: Clear to auscultation bilaterally.  No wheezes, no crackles, no rhonchi.  Fair air movement.  Speaking in full sentences.  Discharge Instructions   Discharge Instructions     Diet general   Complete by: As directed    Increase activity slowly   Complete by: As directed       Allergies as of 11/24/2022       Reactions   Lisinopril Swelling, Other (See Comments)   Lip swelling and Angioedema   Irinotecan Other (See Comments)   Stomach cramping Patient given 0.5 mL atropine IV and was able to continue infusion.  See Progress note on 10/03/20.    Leucovorin Calcium Itching   Patient reported itchy palms towards end of transfusion,  see progress note 08/04/22.   Oxaliplatin Itching   Patient reported itchy palms towards end of transfusion, see progress note on 08/04/22.        Medication List     STOP taking these medications    capecitabine 500 MG tablet Commonly known as: XELODA       TAKE these medications     Acetaminophen 8 Hour 650 MG CR tablet Generic drug: acetaminophen Take 650 mg by mouth every 8 (eight) hours as needed for pain.   amLODipine 10 MG tablet Commonly known as: NORVASC Take 1 tablet (10 mg total) by mouth daily.   chlorthalidone 25 MG tablet Commonly known as: HYGROTON Take 1 tablet (25 mg total) by mouth daily. What changed: when to take this   dexamethasone 4 MG tablet Commonly known as: DECADRON Take 1 tablet by mouth twice a day for 3 days beginning day 2 of chemo.   diclofenac Sodium 1 % Gel Commonly known as: VOLTAREN Research Patient: Apply 0.5 grams (1 fingertip) to each hand and each foot twice daily for up to 12 weeks   dicyclomine 20 MG tablet Commonly known as: BENTYL Take 1 tablet (20 mg total) by mouth 3 (three) times daily as needed for spasms (rectal/AB pain).   HYDROcodone-acetaminophen 10-325 MG tablet Commonly known as: Norco Take 1-2 tablets by mouth every 6 (six) hours as needed.   metoprolol tartrate 50 MG tablet Commonly known as: LOPRESSOR Take 1 tablet (50 mg total) by mouth 2 (two) times daily.   mometasone-formoterol 200-5 MCG/ACT Aero Commonly known as: DULERA Inhale 2 puffs into the lungs 2 (two) times daily.   polyethylene glycol powder 17 GM/SCOOP powder Commonly known as: MiraLax Take 17 g by mouth 2 (two) times daily. What changed:  medication strength when to take this   potassium chloride 10 MEQ CR capsule Commonly known as: MICRO-K Take 2 capsules (20 mEq total) by mouth 2 (two) times daily.   prochlorperazine 10 MG tablet Commonly known as: COMPAZINE Take 1 tablet (10 mg total) by mouth every 6 (six) hours as needed for nausea or vomiting.   senna-docusate 8.6-50 MG tablet Commonly known as: Senokot-S Take 1 tablet by mouth 2 (two) times daily.   umeclidinium bromide 62.5 MCG/ACT Aepb Commonly known as: INCRUSE ELLIPTA Inhale 1 puff into the lungs daily. Start taking on: November 25, 2022        Allergies  Allergen Reactions   Lisinopril Swelling and Other (See Comments)    Lip swelling and Angioedema   Irinotecan Other (See Comments)    Stomach cramping Patient given 0.5 mL atropine IV and was able to continue infusion.  See Progress note on 10/03/20.    Leucovorin Calcium Itching    Patient reported itchy palms towards end of transfusion, see progress note 08/04/22.    Oxaliplatin Itching    Patient reported itchy palms towards end of transfusion, see progress note on 08/04/22.    Follow-up Information     Early, Sung Amabile, NP. Schedule an appointment as soon as possible for a visit in 2 week(s).   Specialty: Nurse Practitioner Contact information: [address] [place] [address] [phone]         Ladene Artist, MD Follow up.   Specialty: Oncology Why: Follow-up as scheduled. Contact information: [address] [place] [address] [phone]                  The results of significant diagnostics from this hospitalization (including  imaging, microbiology, ancillary and laboratory) are listed below for reference.    Significant Diagnostic Studies: CT Angio Chest Pulmonary Embolism (PE) W or WO Contrast  Result Date: 11/21/2022 CLINICAL DATA:  High probability for PE. History of metastatic colon cancer. EXAM: CT ANGIOGRAPHY CHEST WITH CONTRAST TECHNIQUE: Multidetector CT imaging of the chest was performed using the standard protocol during bolus administration of intravenous contrast. Multiplanar CT image reconstructions and MIPs were obtained to evaluate the vascular anatomy. RADIATION DOSE REDUCTION: This exam was performed according to the departmental dose-optimization program which includes automated exposure control, adjustment of the mA and/or kV according to patient size and/or use of iterative reconstruction technique. CONTRAST:  75mL OMNIPAQUE IOHEXOL 350 MG/ML SOLN COMPARISON:  CT chest abdomen and pelvis 10/15/2022. CT  abdomen and pelvis 11/19/2022. FINDINGS: Cardiovascular: Satisfactory opacification of the pulmonary arteries to the segmental level. No evidence of pulmonary embolism. Normal heart size. No pericardial effusion. Mediastinum/Nodes: No enlarged mediastinal, hilar, or axillary lymph nodes. Thyroid gland, trachea, and esophagus demonstrate no significant findings. Lungs/Pleura: Emphysematous changes are similar to prior. There is no focal lung infiltrate, pleural effusion or pneumothorax. Upper Abdomen: Again seen are bile duct stents with intrahepatic biliary ductal dilatation and pneumobilia similar to prior. Stable subcentimeter rounded hypodensity in the left lobe of the liver. Ill-defined hypodense lesion in the left lobe of the liver image 4/96 measures 15 mm and was not definitely seen prior exam 2 days ago. Musculoskeletal: No chest wall abnormality. No acute or significant osseous findings. Review of the MIP images confirms the above findings. IMPRESSION: 1. No evidence for pulmonary embolism or other acute cardiopulmonary process. 2. Stable emphysematous changes. 3. Stable bile duct stents with intrahepatic biliary ductal dilatation and pneumobilia. 4. New 15 mm hypodense lesion in the left lobe of the liver, indeterminate. This is not seen on 11/19/2022. Recommend clinical correlation and follow-up to exclude metastatic disease. Emphysema (ICD10-J43.9). Electronically Signed   By: Darliss Cheney M.D.   On: 11/21/2022 15:38   CT ABDOMEN PELVIS W CONTRAST  Result Date: 11/19/2022 CLINICAL DATA:  Colon cancer, recent chemotherapy. Abdominal pain with low back pain and pelvic pain. * Tracking Code: BO * EXAM: CT ABDOMEN AND PELVIS WITH CONTRAST TECHNIQUE: Multidetector CT imaging of the abdomen and pelvis was performed using the standard protocol following bolus administration of intravenous contrast. RADIATION DOSE REDUCTION: This exam was performed according to the departmental dose-optimization program  which includes automated exposure control, adjustment of the mA and/or kV according to patient size and/or use of iterative reconstruction technique. CONTRAST:  80mL OMNIPAQUE IOHEXOL 300 MG/ML  SOLN COMPARISON:  10/15/2022 CT scan FINDINGS: Lower chest: Unremarkable Hepatobiliary: Two biliary stents remain in place dilated and thick-walled right and left hepatic ducts with associated surrounding hypoenhancement similar to the 10/15/2022 exam. Small amount of pneumobilia. The overall degree of peripheral intrahepatic biliary dilatation is minimally increased from prior. 6 mm left hepatic lobe cyst on image 7 series 2 unchanged. Pancreas: Mild dilation of the dorsal pancreatic duct in the pancreatic head similar to prior. Spleen: Unremarkable Adrenals/Urinary Tract: Stable appearance of bilateral nonobstructive renal calculi. Mild scarring in portions of the right kidney. Both adrenal glands appear normal. No hydronephrosis or hydroureter. Urinary bladder relatively empty but otherwise unremarkable. Stomach/Bowel: Substantial circumferential wall thickening in the rectum, residual tumor not excluded. No current dilated bowel. Normal appendix. Vascular/Lymphatic: Mild atheromatous vascular calcification of the common iliac arteries. Reproductive: Poor definition of fat planes between the uterus on the thick-walled rectum. Other: Enlarging  tumor studding along the left paracolic gutter, with a nodule for example measuring 1.6 by 1.3 cm on image 34 series 2 and previously measuring 0.9 by 0.8 cm. Additional tumor studding along the left paracolic gutter is worsened. Mild change in orientation of the omentum complicates comparison of the omental nodularity. Most of this nodularity is fairly stable although components such as the 1.2 by 1.1 cm omental soft tissue nodule on image 46 of series 2 appear increased. Musculoskeletal: Moderate to prominent left and moderate right degenerative hip arthropathy. Levoconvex lumbar  scoliosis. 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 IMPRESSION: 1. Worsening tumor studding along the left paracolic gutter. 2. Mildly worsening omental nodularity. 3. Substantial circumferential wall thickening in the rectum, residual tumor not excluded. Poor definition of fat planes between the uterus and the thick-walled rectum. 4. Two biliary stents remain in place with dilated and thick-walled right and left hepatic ducts with associated surrounding hypoenhancement similar to the 10/15/2022 exam. The overall degree of peripheral intrahepatic biliary dilatation is minimally increased from prior. 5. Stable appearance of bilateral nonobstructive renal calculi. 6. 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 7. Moderate to prominent left and moderate right degenerative hip arthropathy. 8. Levoconvex lumbar scoliosis. 9. Mild atheromatous vascular calcification of the common iliac arteries. Electronically Signed   By: Gaylyn Rong M.D.   On: 11/19/2022 20:00   DG Abd 2 Views  Result Date: 11/19/2022 CLINICAL DATA:  Abdominal pain.  History of metastatic colon cancer EXAM: ABDOMEN - 2 VIEW COMPARISON:  06/23/2022; CT abdomen pelvis-06/25/2022 FINDINGS: Paucity of bowel gas without definitive evidence of enteric obstruction. No pneumoperitoneum, pneumatosis or portal venous gas Punctate opacities overlie the bilateral renal fossa compatible with known bilateral nephrolithiasis. Punctate phlebolith overlies the right hemipelvis, unchanged. Biliary stents overlie the right upper abdominal quadrant. Limited visualization of the lower thorax is normal given obliquity and technique Mild scoliotic curvature of the thoracolumbar spine. No definite acute osseous abnormalities. IMPRESSION: 1. Paucity of bowel gas without definitive evidence of enteric obstruction. 2. Bilateral nephrolithiasis. Electronically Signed   By: Simonne Come M.D.   On:  11/19/2022 15:38    Microbiology: No results found for this or any previous visit (from the past 240 hour(s)).   Labs: Basic Metabolic Panel: Recent Labs  Lab 11/20/22 0550 11/21/22 0557 11/22/22 0710 11/23/22 0653 11/24/22 0521  NA 137 138 135 134* 132*  K 3.1* 3.5 3.2* 3.3* 3.4*  CL 100 101 97* 99 98  CO2 25 24 24 24 22   GLUCOSE 88 139* 93 85 93  BUN 7 9 11 15 12   CREATININE 0.50 0.58 0.52 0.58 0.50  CALCIUM 9.1 10.3 9.9 9.2 9.3  MG 2.0 2.2 1.9  --  2.0  PHOS  --  4.0  --   --   --    Liver Function Tests: Recent Labs  Lab 11/19/22 1619 11/21/22 0557  AST 22 35  ALT 13 24  ALKPHOS 159* 172*  BILITOT 0.4 0.6  PROT 8.6* 8.9*  ALBUMIN 4.4 4.4  4.5   Recent Labs  Lab 11/19/22 1619 11/21/22 0557  LIPASE 37 33   No results for input(s): "AMMONIA" in the last 168 hours. CBC: Recent Labs  Lab 11/19/22 1619 11/20/22 0550 11/21/22 0557 11/22/22 0710 11/23/22 0653 11/24/22 0521  WBC 7.5 6.7 8.6 7.3 6.3 6.0  NEUTROABS 4.2  --   --  3.6  --   --  HGB 14.5 13.0 15.3* 14.8 14.0 13.6  HCT 43.0 38.4 45.0 43.9 43.1 42.0  MCV 91.7 92.3 92.8 92.0 94.3 95.2  PLT 285 242 291 273 263 262   Cardiac Enzymes: No results for input(s): "CKTOTAL", "CKMB", "CKMBINDEX", "TROPONINI" in the last 168 hours. BNP: BNP (last 3 results) No results for input(s): "BNP" in the last 8760 hours.  ProBNP (last 3 results) No results for input(s): "PROBNP" in the last 8760 hours.  CBG: No results for input(s): "GLUCAP" in the last 168 hours.     Signed:  Ramiro Harvest MD.  [facility] 11/24/2022, 12:55 PM

## 2022-11-24 NOTE — Plan of Care (Signed)

## 2022-11-26 LAB — SURGICAL PATHOLOGY

## 2022-11-27 ENCOUNTER — Other Ambulatory Visit: Payer: Self-pay

## 2022-11-27 ENCOUNTER — Ambulatory Visit: Payer: Self-pay | Admitting: Licensed Clinical Social Worker

## 2022-11-27 NOTE — Patient Outreach (Signed)
  Care Coordination   Initial Visit Note   11/27/2022 Name: April [name] MRN: [id number] DOB: Feb 16, 1975  April [name] is a 47 y.o. year old female who sees Early, Sung Amabile, NP for primary care. I spoke with  April [name] by phone today.  What matters to the patients health and wellness today?  Food resources and transportaion, patient has both needs taken care of by family     Goals Addressed             This Visit's Progress    COMPLETED: Care Coordination Activities       Care Coordination Interventions: Patient stated that she receives $60 in food stamps and that her children prepare food prep for her while they are away from the home, SW talked about Meals on Wheels but patient is fine with what she and her children have come up with for now. Patient stated that she has transportation to medical appointments with medical transportation and the patient did not have any other SDOH issues or concerns. Patient does not need and other follow up and SW encouraged the patient for any other future needs to contact the PCP to be put back on the SW's schedule.        SDOH assessments and interventions completed:  Yes  SDOH Interventions Today    Flowsheet Row Most Recent Value  SDOH Interventions   Food Insecurity Interventions Intervention Not Indicated  [Patient stated that she receives $60 in food stamps and that her children prepare food prep for her while they are away from the home, SW talked about Meals on Wheels]        Care Coordination Interventions:  Yes, provided  Interventions Today    Flowsheet Row Most Recent Value  General Interventions   General Interventions Discussed/Reviewed General Interventions Discussed, [employer] stated that she receives $60 in food stamps and that her children prepare food prep for her while they are away from the home, SW talked about Meals on Wheels]        Follow up plan: No further  intervention required.   Encounter Outcome:  Patient Visit Completed   Jeanie Cooks, PhD [facility], [facility] Social Worker Direct Dial: [phone]  Fax: [phone]

## 2022-11-27 NOTE — Patient Instructions (Signed)
Visit Information  Thank you for taking time to visit with me today. Please don't hesitate to contact me if I can be of assistance to you.   Following are the goals we discussed today:   Goals Addressed             This Visit's Progress    COMPLETED: Care Coordination Activities       Care Coordination Interventions: Patient stated that she receives $60 in food stamps and that her children prepare food prep for her while they are away from the home, SW talked about Meals on Wheels but patient is fine with what she and her children have come up with for now. Patient stated that she has transportation to medical appointments with medical transportation and the patient did not have any other SDOH issues or concerns. Patient does not need and other follow up and SW encouraged the patient for any other future needs to contact the PCP to be put back on the SW's schedule.        No further follow up needed  Please call the care guide team at [phone] if you need to cancel or reschedule your appointment.   If you are experiencing a Mental Health or Behavioral Health Crisis or need someone to talk to, please call the Suicide and Crisis Lifeline: 988 go to [facility] Urgent [facility] [address], [place] [phone]) call 911  Patient verbalizes understanding of instructions and care plan provided today and agrees to view in MyChart. Active MyChart status and patient understanding of how to access instructions and care plan via MyChart confirmed with patient.     Jeanie Cooks, PhD [facility], [facility] Social Worker Direct Dial: [phone]  Fax: [phone]

## 2022-12-03 ENCOUNTER — Inpatient Hospital Stay: Payer: 59

## 2022-12-03 ENCOUNTER — Other Ambulatory Visit (HOSPITAL_BASED_OUTPATIENT_CLINIC_OR_DEPARTMENT_OTHER): Payer: Self-pay

## 2022-12-03 ENCOUNTER — Encounter: Payer: Self-pay | Admitting: Nurse Practitioner

## 2022-12-03 ENCOUNTER — Inpatient Hospital Stay (HOSPITAL_BASED_OUTPATIENT_CLINIC_OR_DEPARTMENT_OTHER): Payer: 59 | Admitting: Nurse Practitioner

## 2022-12-03 VITALS — BP 139/82 | HR 91 | Temp 98.1°F | Resp 18 | Ht 64.0 in | Wt 139.6 lb

## 2022-12-03 DIAGNOSIS — I10 Essential (primary) hypertension: Secondary | ICD-10-CM | POA: Diagnosis not present

## 2022-12-03 DIAGNOSIS — R109 Unspecified abdominal pain: Secondary | ICD-10-CM | POA: Diagnosis present

## 2022-12-03 DIAGNOSIS — C187 Malignant neoplasm of sigmoid colon: Secondary | ICD-10-CM | POA: Diagnosis not present

## 2022-12-03 DIAGNOSIS — C19 Malignant neoplasm of rectosigmoid junction: Secondary | ICD-10-CM | POA: Diagnosis not present

## 2022-12-03 DIAGNOSIS — R1110 Vomiting, unspecified: Secondary | ICD-10-CM | POA: Diagnosis not present

## 2022-12-03 DIAGNOSIS — K5900 Constipation, unspecified: Secondary | ICD-10-CM | POA: Diagnosis present

## 2022-12-03 DIAGNOSIS — C7951 Secondary malignant neoplasm of bone: Secondary | ICD-10-CM | POA: Diagnosis not present

## 2022-12-03 DIAGNOSIS — Z87442 Personal history of urinary calculi: Secondary | ICD-10-CM | POA: Diagnosis not present

## 2022-12-03 DIAGNOSIS — C786 Secondary malignant neoplasm of retroperitoneum and peritoneum: Secondary | ICD-10-CM | POA: Diagnosis not present

## 2022-12-03 DIAGNOSIS — R197 Diarrhea, unspecified: Secondary | ICD-10-CM | POA: Diagnosis present

## 2022-12-03 LAB — CMP (CANCER CENTER ONLY)
ALT: 25 U/L (ref 0–44)
AST: 33 U/L (ref 15–41)
Albumin: 4.4 g/dL (ref 3.5–5.0)
Alkaline Phosphatase: 150 U/L — ABNORMAL HIGH (ref 38–126)
Anion gap: 11 (ref 5–15)
BUN: 14 mg/dL (ref 6–20)
CO2: 30 mmol/L (ref 22–32)
Calcium: 10.4 mg/dL — ABNORMAL HIGH (ref 8.9–10.3)
Chloride: 96 mmol/L — ABNORMAL LOW (ref 98–111)
Creatinine: 0.64 mg/dL (ref 0.44–1.00)
GFR, Estimated: >60 mL/min
Glucose, Bld: 86 mg/dL (ref 70–99)
Potassium: 3.1 mmol/L — ABNORMAL LOW (ref 3.5–5.1)
Sodium: 137 mmol/L (ref 135–145)
Total Bilirubin: 0.3 mg/dL
Total Protein: 8.4 g/dL — ABNORMAL HIGH (ref 6.5–8.1)

## 2022-12-03 LAB — CBC WITH DIFFERENTIAL (CANCER CENTER ONLY)
Abs Immature Granulocytes: 0.01 10*3/uL (ref 0.00–0.07)
Basophils Absolute: 0.1 10*3/uL (ref 0.0–0.1)
Basophils Relative: 1 %
Eosinophils Absolute: 0.5 10*3/uL (ref 0.0–0.5)
Eosinophils Relative: 7 %
HCT: 43 % (ref 36.0–46.0)
Hemoglobin: 14.4 g/dL (ref 12.0–15.0)
Immature Granulocytes: 0 %
Lymphocytes Relative: 37 %
Lymphs Abs: 2.4 10*3/uL (ref 0.7–4.0)
MCH: 30.3 pg (ref 26.0–34.0)
MCHC: 33.5 g/dL (ref 30.0–36.0)
MCV: 90.5 fL (ref 80.0–100.0)
Monocytes Absolute: 0.5 10*3/uL (ref 0.1–1.0)
Monocytes Relative: 8 %
Neutro Abs: 2.9 10*3/uL (ref 1.7–7.7)
Neutrophils Relative %: 47 %
Platelet Count: 303 10*3/uL (ref 150–400)
RBC: 4.75 MIL/uL (ref 3.87–5.11)
RDW: 13.2 % (ref 11.5–15.5)
WBC Count: 6.3 10*3/uL (ref 4.0–10.5)
nRBC: 0 % (ref 0.0–0.2)

## 2022-12-03 LAB — CEA (ACCESS): CEA (CHCC): 5.9 ng/mL — ABNORMAL HIGH (ref 0.00–5.00)

## 2022-12-03 MED ORDER — OXYCODONE HCL ER 10 MG PO T12A
10.0000 mg | EXTENDED_RELEASE_TABLET | Freq: Two times a day (BID) | ORAL | 0 refills | Status: DC
  Filled 2022-12-03: qty 60, 30d supply, fill #0

## 2022-12-03 MED ORDER — MORPHINE SULFATE ER 15 MG PO TBCR
15.0000 mg | EXTENDED_RELEASE_TABLET | Freq: Two times a day (BID) | ORAL | 0 refills | Status: DC
  Filled 2022-12-03 – 2022-12-08 (×2): qty 60, 30d supply, fill #0

## 2022-12-03 NOTE — Progress Notes (Unsigned)
[facility] OFFICE PROGRESS NOTE   Diagnosis: Colon cancer  INTERVAL HISTORY:   Ms. Anzaldua returns as scheduled.  She continues to have abdominal pain, mainly on the right side.  She has intermittent nausea, periodic vomiting.  Bowels are moving.  Objective:  Vital signs in last 24 hours:  Blood pressure 139/82, pulse 91, temperature 98.1 F (36.7 C), temperature source Temporal, resp. rate 18, height 5\' 4"  (1.626 m), weight 139 lb 9.6 oz (63.3 kg), last menstrual period 01/07/2006, SpO2 100%.    HEENT: White coating over tongue. Resp: Lungs clear bilaterally Cardio: Regular rate and rhythm. GI: Abdomen soft.  Tender over the right mid to low abdomen.  No hepatomegaly.  No mass. Vascular: No leg edema.  Lab Results:  Lab Results  Component Value Date   WBC 6.0 11/24/2022   HGB 13.6 11/24/2022   HCT 42.0 11/24/2022   MCV 95.2 11/24/2022   PLT 262 11/24/2022   NEUTROABS 3.6 11/22/2022    Imaging:  No results found.  Medications: I have reviewed the patient's current medications.  Assessment/Plan: Sigmoid colon cancer, stage IV (pT4a,pN2b,M1c) Colonoscopy 03/24/2019-3 rectal polyps-hyperplastic polyps, distal colon biopsy-at least intramucosal adenocarcinoma, completely obstructing mass in the distal sigmoid colon, could not be traversed, intact mismatch repair protein expression 03/24/2019-CEA 56.1 03/29/2019 CT abdomen/pelvis-circumferential thickening involving the entire mid and distal sigmoid colon to the level of the rectosigmoid junction, solid/cystic mass in the left ovary, bilateral nephrolithiasis Robotic assisted low anterior resection, mesenteric lymphadenectomy, bilateral salpingo-oophorectomy 04/08/2019 Pathology ([facility] review of outside pathology) metastatic adenocarcinoma involving the peritoneum overlying the round ligament, serosa of the urinary bladder, serosa of the right ureter a sacral area, pelvic peritoneum, and left ovary.  Omental  biopsy with focal mucin pools with no carcinoma cells identified, right hemidiaphragm biopsy involved by metastatic adenocarcinoma, invasive adenocarcinoma the sigmoid colon, moderately differentiated, T4a, perineural and vascular invasion present, 7/22 lymph nodes, multiple tumor deposits, resection margins negative Negative for PD-L1, low probability of MSI-high, HER-2 negative, negative for BRAF, NRAS and KRAS alterations CTs 05/19/2019-no evidence of metastatic disease, findings suspicious for colitis of the transverse and ascending colon, small amount of ascites in the cul-de-sac, bilateral renal calculi Cycle 1 FOLFIRI 05/24/2019, bevacizumab added with cycle 2 Cycle 4 FOLFIRI/bevacizumab 07/05/2019 Cycle 5 FOLFIRI 07/27/2019, bevacizumab held secondary to hypertension Cycle 6 FOLFIRI 08/10/1999, bevacizumab held secondary to hypertension CTs 08/30/2019-no evidence of recurrent disease, new subsolid right lower lobe nodule felt to be inflammatory, emphysema Cycle 7 FOLFIRI 09/26/2019, bevacizumab remains on hold secondary to hypertension Cycle 8 FOLFIRI 10/17/2019, bevacizumab held secondary to hypertension, Udenyca added for neutropenia Cycle 9 FOLFIRI 11/07/2019, bevacizumab held secondary to hypertension, Udenyca Cycle 10 FOLFIRI 11/28/2019, bevacizumab held, Udenyca Cycle 11 FOLFIRI 12/26/2019, bevacizumab held, Udenyca CTs 01/25/2020-no evidence of recurrent disease, resolution of right lower lobe nodule Maintenance Xeloda beginning 02/06/2020 CTs 04/25/2020- no evidence of metastatic disease, multiple bilateral renal calculi without hydronephrosis Maintenance Xeloda continued CT abdomen/pelvis without contrast 08/10/2020-bilateral staghorn renal calculi, no evidence of metastatic disease; addendum 08/23/2020-small but increasing omental nodules. Cycle 1 FOLFIRI/panitumumab 09/19/2020 Cycle 2 FOLFIRI/Panitumumab 10/03/2020 Cycle 3 FOLFIRI/Panitumumab 10/17/2020 Cycle 4 FOLFIRI/panitumumab  10/31/2020 Cycle 5 FOLFIRI/Panitumumab 11/14/2020 CTs 11/26/2020-stable omental metastases compared to 10/22/2020, mildly decreased from 08/10/2020.  Left lower quadrant tiny paracolic gutter implant slightly decreased from CT 08/10/2020.  No new or progressive metastatic disease in the abdomen or pelvis. Cycle 6 FOLFIRI/Panitumumab 11/28/2020, irinotecan dose reduced, treatment schedule adjusted to every 3 weeks going forward Cycle 7 FOLFIRI/panitumumab  12/24/2020 Cycle 8 FOLFIRI/Panitumumab 01/16/2021--treatment held due to hypertension in the infusion area. Cycle 8 FOLFIRI/panitumumab 01/21/2021 Cycle 9 FOLFIRI/panitumumab 02/11/2021 Cycle 10 FOLFIRI/Panitumumab 03/04/2021 Cycle 11 FOLFIRI/Panitumumab 03/25/2021 CT abdomen/pelvis 04/10/2021-stable right omental and left posterior paracolic gutter nodules Cycle 12 FOLFIRI/panitumumab 04/15/2021 Cycle 13 FOLFIRI/panitumumab 05/06/2021 Cycle 14 FOLFIRI/Panitumumab 05/27/2021 Cycle 15 FOLFIRI/Panitumumab 06/17/2021 CT abdomen/pelvis 07/05/2021-slight enlargement of a dominant right omental implant, no new implants Patient requested a treatment break CT abdomen/pelvis 10/04/2021-mild increase in size of multiple omental soft tissue nodules, 4 mm calculus in the distal left ureter adjacent to the ureteral stent Cycle 1 Lonsurf 10/14/2021 10/28/2021 Avastin every 2 weeks Cycle 2 Lonsurf 11/11/2021 Cycle 3 Lonsurf 12/09/2021 Cycle 4 Lonsurf 01/06/2022 Avastin held 01/20/2022 due to proteinuria, 24-hour urine 43 mg CT/pelvis 01/30/2022-possible new peritoneal implant at the transverse colon, no significant change in other omental/peritoneal implants, stable chronic rectal wall thickening, status post removal of double-J left ureteral stent with no evidence of hydronephrosis or ureteral calculus, nonobstructing bilateral renal calculi Cycle 5 Lonsurf 02/05/2022 or 02/06/2022 Avastin every 2 weeks Cycle 6 Lonsurf 03/03/2022 CTs 04/05/2022-increase in size and number of  peritoneal implants compared to January 2024 central liver mass narrowing the anterior branch of the right portal vein, decreased biliary duct dilation, biliary stents in place, stable right cardiophrenic lymph nodes, separate pigtail stent in the lumen of the proximal duodenum Cycle 1 FOLFOX 04/14/2022 CT abdomen/pelvis 04/16/2022-stent within the third portion of the duodenum, stable peritoneal nodules, indwelling biliary stents with mild left biliary duct dilation Cycle 2 FOLFOX 04/28/2022, Emend and prophylactic dexamethasone added Cycle 3 FOLFOX 05/12/2022, Aloxi, Emend, prophylactic dexamethasone, Compazine and lorazepam as needed Chemotherapy held 05/25/2020 due to severe hypertension, evaluated in the emergency department Cycle 4 FOLFOX 06/03/2022 Cycle 5 FOLFOX 06/16/2022, oxaliplatin dose reduced and 5-FU bolus eliminated due to neutropenia CT 06/25/2022-unchanged soft tissue at the hepatic hilum, unchanged, bile duct stent, decreased size of peritoneal and omental nodules and diminished volume of likely fluid in the lower left pelvis Cycle 6 FOLFOX 06/30/2022 Cycle 7 FOLFOX 07/21/2022 CT 07/24/2022 (in the emergency department with nausea, headache, abdominal pain)-no acute pathology.  No significant interval change in omental nodularity. Cycle 8 FOLFOX 08/04/2022, pruritus on the palms at the end of the oxaliplatin infusion, Pepcid and Benadryl given, symptoms resolved, infusion not resumed Cycle 9 FOLFOX 08/18/2022-Pepcid and Benadryl added to premedications, Oxaliplatin diluted in a larger volume and infusion time increased Cycle 10 FOLFOX 09/08/2022 Cycle 11 FOLFOX 09/22/2022 10/06/2022 patient declined treatment CTs 10/15/2022-similar soft tissue fullness in the hepatic hilum with biliary stents in place, decrease in omental/peritoneal metastases Maintenance capecitabine 10/26/2022 CT abdomen/pelvis 11/19/2022-mild increase in tumor studding at the left paracolic gutter and omentum, circumferential wall  thickening of the rectum, biliary stents in place with minimal increase in intrahepatic biliary dilation, nonobstructive renal calculi Colonoscopy 11/23/2022-anastomosis at 15 cm from the anal verge-patent, 2 cm polypoid tissue at the anastomosis biopsied, invasive adenocarcinoma moderately differentiated, preserved expression of major MMR proteins Plan for irinotecan/Panitumumab every 2 weeks beginning 12/16/2022   Hypertension G4 P3, twins Kidney stones-bilateral staghorn renal calculi on CT 08/10/2020 Infected Port-A-Cath 09/12/2019-placed on Augmentin, referred for Port-A-Cath removal; Port-A-Cath removed 09/14/2019; PICC line placed 09/14/2019; culture staph aureus.  Course of Septra completed. Neutropenia secondary to chemotherapy-Udenyca added with cycle 8 FOLFIRI Right nephrostomy tube 09/12/2020; stent placed 10/09/2020, percutaneous nephrostolithotomy treatment of right-sided kidney stones 10/09/2020, malpositioned right ureter stent replacement 10/23/2020, right ureter stent removed 10/29/2020 Percutaneous left nephrostolithotomy 10/01/2021-no renal stone identified, left ureter stent  left in place Cystoscopy 10/14/2021-stent removed 8.  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  Admission 04/16/2022 with nausea/vomiting and abdominal pain, felt to be acute toxicity related to chemotherapy, discharged home 04/18/2022 10.  Admission 11/19/2022 with nausea/vomiting and abdominal pain-improved  Disposition: Ms. Vicenti  appears unchanged.  Recent CT showed evidence of mild progression involving peritoneal/omental nodules.  Capecitabine has been discontinued.  She agrees with Dr. Kalman Drape recommendation for irinotecan/Panitumumab every 2 weeks.  She has had both of these in the past.  We again reviewed potential toxicities.  She would like to begin treatment 12/16/2022.  We will make arrangements for PICC line placement 12/15/2022.  She will begin OxyContin 10 mg twice daily for the persistent abdominal pain.  She will continue hydrocodone as needed.  She agrees to a referral to Dr. Rod Mae to discuss treatment options, clinical trial availability.  We will see her in follow-up prior to cycle 2 irinotecan/Panitumumab 12/30/2022.  She will contact the office in the interim with any problems.  Patient seen with Dr. Truett Perna.  Lonna Cobb ANP/GNP-BC   12/03/2022  1:20 PM  This was a shared visit with Lonna Cobb.  Ms. Boecker continues to have abdominal pain, likely secondary to progressive carcinomatosis.  We discussed treatment options.  We refer her to Dr. Rod Mae to see if she will be a candidate for a clinical trial.  We discussed resuming treatment with panitumumab based therapy.  We can also consider resuming FOLFOX.  She has been treated with multiple systemic therapy regimens.  The chance of a clinical response is small.  Measurable disease is limited.  The initial plan was to resume irinotecan/panitumumab since she would like to avoid right the 5-FU pump.  We will hold treatment if she is going to see Dr. Rod Mae within the next few weeks.  We will see her after the appointment with Dr. Rod Mae to decide on a treatment plan.  I was present for greater than 50% of today's visit.  I performed Medical Decision Making.  Mancel Bale, MD

## 2022-12-04 ENCOUNTER — Encounter: Payer: Self-pay | Admitting: Oncology

## 2022-12-04 ENCOUNTER — Telehealth: Payer: Self-pay

## 2022-12-04 NOTE — Telephone Encounter (Signed)
The patient expressed that she has not been taking her potassium supplement but intends to resume it.

## 2022-12-04 NOTE — Telephone Encounter (Signed)
-----   Message from Lonna Cobb sent at 12/03/2022  3:38 PM EST ----- Please let her know potassium is low.  Is she taking potassium?  If so, what is the dose/schedule?

## 2022-12-05 NOTE — Progress Notes (Unsigned)
Faxed referral order, demographics and medical records to [facility] for Dr. Aaron Mose [phone]

## 2022-12-08 ENCOUNTER — Telehealth: Payer: Self-pay | Admitting: *Deleted

## 2022-12-08 ENCOUNTER — Other Ambulatory Visit (HOSPITAL_COMMUNITY): Payer: Self-pay

## 2022-12-08 ENCOUNTER — Other Ambulatory Visit: Payer: Self-pay | Admitting: Nurse Practitioner

## 2022-12-08 ENCOUNTER — Other Ambulatory Visit (HOSPITAL_BASED_OUTPATIENT_CLINIC_OR_DEPARTMENT_OTHER): Payer: Self-pay

## 2022-12-08 DIAGNOSIS — C20 Malignant neoplasm of rectum: Secondary | ICD-10-CM

## 2022-12-08 DIAGNOSIS — C786 Secondary malignant neoplasm of retroperitoneum and peritoneum: Secondary | ICD-10-CM

## 2022-12-08 MED ORDER — HYDROCODONE-ACETAMINOPHEN 10-325 MG PO TABS
1.0000 | ORAL_TABLET | Freq: Four times a day (QID) | ORAL | 0 refills | Status: DC | PRN
  Filled 2022-12-08: qty 60, 8d supply, fill #0

## 2022-12-08 NOTE — Telephone Encounter (Signed)
Called to request refill on hydrocodone-apap (is out) and wants it sent to [facility]. Also asking for the MS Contin script to be transferred to [facility].  [facility] and they will be able to transfer the MS Contin to [facility].

## 2022-12-09 ENCOUNTER — Other Ambulatory Visit (HOSPITAL_COMMUNITY): Payer: Self-pay

## 2022-12-10 ENCOUNTER — Encounter: Payer: Self-pay | Admitting: Oncology

## 2022-12-12 ENCOUNTER — Other Ambulatory Visit (HOSPITAL_COMMUNITY): Payer: Self-pay | Admitting: Pharmacy Technician

## 2022-12-12 ENCOUNTER — Other Ambulatory Visit (HOSPITAL_COMMUNITY): Payer: Self-pay

## 2022-12-12 NOTE — Progress Notes (Signed)
Disenrolled patient; Per Alan Ripper  on 12/03/22 Capecitabine has been D/c patient is going to an infusion & can be disenrolled.

## 2022-12-15 ENCOUNTER — Emergency Department (HOSPITAL_COMMUNITY): Payer: 59

## 2022-12-15 ENCOUNTER — Encounter (HOSPITAL_COMMUNITY): Payer: Self-pay

## 2022-12-15 ENCOUNTER — Ambulatory Visit (HOSPITAL_COMMUNITY)
Admission: RE | Admit: 2022-12-15 | Discharge: 2022-12-15 | Disposition: A | Discharge status: Home / Self Care | Payer: 59 | Source: Ambulatory Visit | Attending: Nurse Practitioner | Admitting: Nurse Practitioner

## 2022-12-15 ENCOUNTER — Telehealth: Payer: Self-pay | Admitting: *Deleted

## 2022-12-15 ENCOUNTER — Inpatient Hospital Stay (HOSPITAL_COMMUNITY)
Admission: EM | Admit: 2022-12-15 | Discharge: 2022-12-18 | DRG: 641 | Disposition: A | Discharge status: Home / Self Care | Payer: 59 | Attending: Internal Medicine | Admitting: Internal Medicine

## 2022-12-15 ENCOUNTER — Other Ambulatory Visit: Payer: Self-pay

## 2022-12-15 ENCOUNTER — Other Ambulatory Visit: Payer: Self-pay | Admitting: Nurse Practitioner

## 2022-12-15 ENCOUNTER — Inpatient Hospital Stay: Payer: 59 | Attending: Oncology

## 2022-12-15 DIAGNOSIS — I7121 Aneurysm of the ascending aorta, without rupture: Secondary | ICD-10-CM | POA: Diagnosis present

## 2022-12-15 DIAGNOSIS — Z79899 Other long term (current) drug therapy: Secondary | ICD-10-CM

## 2022-12-15 DIAGNOSIS — Z87442 Personal history of urinary calculi: Secondary | ICD-10-CM

## 2022-12-15 DIAGNOSIS — C187 Malignant neoplasm of sigmoid colon: Secondary | ICD-10-CM | POA: Insufficient documentation

## 2022-12-15 DIAGNOSIS — K1121 Acute sialoadenitis: Secondary | ICD-10-CM | POA: Diagnosis present

## 2022-12-15 DIAGNOSIS — K1120 Sialoadenitis, unspecified: Secondary | ICD-10-CM | POA: Diagnosis present

## 2022-12-15 DIAGNOSIS — R112 Nausea with vomiting, unspecified: Secondary | ICD-10-CM | POA: Diagnosis present

## 2022-12-15 DIAGNOSIS — Z85038 Personal history of other malignant neoplasm of large intestine: Secondary | ICD-10-CM

## 2022-12-15 DIAGNOSIS — Z833 Family history of diabetes mellitus: Secondary | ICD-10-CM

## 2022-12-15 DIAGNOSIS — Z87891 Personal history of nicotine dependence: Secondary | ICD-10-CM

## 2022-12-15 DIAGNOSIS — R6884 Jaw pain: Secondary | ICD-10-CM | POA: Diagnosis present

## 2022-12-15 DIAGNOSIS — Z5112 Encounter for antineoplastic immunotherapy: Secondary | ICD-10-CM | POA: Insufficient documentation

## 2022-12-15 DIAGNOSIS — Z823 Family history of stroke: Secondary | ICD-10-CM

## 2022-12-15 DIAGNOSIS — I10 Essential (primary) hypertension: Secondary | ICD-10-CM | POA: Diagnosis present

## 2022-12-15 DIAGNOSIS — Z8249 Family history of ischemic heart disease and other diseases of the circulatory system: Secondary | ICD-10-CM

## 2022-12-15 DIAGNOSIS — J439 Emphysema, unspecified: Secondary | ICD-10-CM | POA: Diagnosis present

## 2022-12-15 DIAGNOSIS — C19 Malignant neoplasm of rectosigmoid junction: Secondary | ICD-10-CM

## 2022-12-15 DIAGNOSIS — C786 Secondary malignant neoplasm of retroperitoneum and peritoneum: Secondary | ICD-10-CM | POA: Diagnosis present

## 2022-12-15 DIAGNOSIS — C799 Secondary malignant neoplasm of unspecified site: Secondary | ICD-10-CM

## 2022-12-15 DIAGNOSIS — E860 Dehydration: Secondary | ICD-10-CM | POA: Diagnosis not present

## 2022-12-15 DIAGNOSIS — Z452 Encounter for adjustment and management of vascular access device: Secondary | ICD-10-CM | POA: Insufficient documentation

## 2022-12-15 DIAGNOSIS — B370 Candidal stomatitis: Principal | ICD-10-CM

## 2022-12-15 DIAGNOSIS — Z7951 Long term (current) use of inhaled steroids: Secondary | ICD-10-CM

## 2022-12-15 DIAGNOSIS — C20 Malignant neoplasm of rectum: Secondary | ICD-10-CM | POA: Diagnosis present

## 2022-12-15 DIAGNOSIS — Z888 Allergy status to other drugs, medicaments and biological substances status: Secondary | ICD-10-CM

## 2022-12-15 DIAGNOSIS — C7951 Secondary malignant neoplasm of bone: Secondary | ICD-10-CM | POA: Insufficient documentation

## 2022-12-15 DIAGNOSIS — Z5111 Encounter for antineoplastic chemotherapy: Secondary | ICD-10-CM | POA: Insufficient documentation

## 2022-12-15 DIAGNOSIS — E876 Hypokalemia: Secondary | ICD-10-CM | POA: Diagnosis present

## 2022-12-15 DIAGNOSIS — G893 Neoplasm related pain (acute) (chronic): Secondary | ICD-10-CM | POA: Diagnosis present

## 2022-12-15 DIAGNOSIS — K831 Obstruction of bile duct: Secondary | ICD-10-CM | POA: Diagnosis present

## 2022-12-15 HISTORY — PX: IR PATIENT EVAL TECH 0-60 MINS: IMG5564

## 2022-12-15 LAB — RESP PANEL BY RT-PCR (RSV, FLU A&B, COVID)  RVPGX2
Influenza A by PCR: NEGATIVE
Influenza B by PCR: NEGATIVE
Resp Syncytial Virus by PCR: NEGATIVE
SARS Coronavirus 2 by RT PCR: NEGATIVE

## 2022-12-15 LAB — CBC WITH DIFFERENTIAL/PLATELET
Abs Immature Granulocytes: 0.01 10*3/uL (ref 0.00–0.07)
Basophils Absolute: 0 10*3/uL (ref 0.0–0.1)
Basophils Relative: 1 %
Eosinophils Absolute: 0.4 10*3/uL (ref 0.0–0.5)
Eosinophils Relative: 5 %
HCT: 37.4 % (ref 36.0–46.0)
Hemoglobin: 12.8 g/dL (ref 12.0–15.0)
Immature Granulocytes: 0 %
Lymphocytes Relative: 33 %
Lymphs Abs: 2.2 10*3/uL (ref 0.7–4.0)
MCH: 31.3 pg (ref 26.0–34.0)
MCHC: 34.2 g/dL (ref 30.0–36.0)
MCV: 91.4 fL (ref 80.0–100.0)
Monocytes Absolute: 0.7 10*3/uL (ref 0.1–1.0)
Monocytes Relative: 11 %
Neutro Abs: 3.4 10*3/uL (ref 1.7–7.7)
Neutrophils Relative %: 50 %
Platelets: 298 10*3/uL (ref 150–400)
RBC: 4.09 MIL/uL (ref 3.87–5.11)
RDW: 13.3 % (ref 11.5–15.5)
WBC: 6.8 10*3/uL (ref 4.0–10.5)
nRBC: 0 % (ref 0.0–0.2)

## 2022-12-15 LAB — COMPREHENSIVE METABOLIC PANEL
ALT: 28 U/L (ref 0–44)
AST: 32 U/L (ref 15–41)
Albumin: 3.6 g/dL (ref 3.5–5.0)
Alkaline Phosphatase: 212 U/L — ABNORMAL HIGH (ref 38–126)
Anion gap: 5 (ref 5–15)
BUN: 10 mg/dL (ref 6–20)
CO2: 34 mmol/L — ABNORMAL HIGH (ref 22–32)
Calcium: 8.8 mg/dL — ABNORMAL LOW (ref 8.9–10.3)
Chloride: 93 mmol/L — ABNORMAL LOW (ref 98–111)
Creatinine, Ser: 0.53 mg/dL (ref 0.44–1.00)
GFR, Estimated: >60 mL/min (ref >60)
Glucose, Bld: 87 mg/dL (ref 70–99)
Potassium: 2.8 mmol/L — ABNORMAL LOW (ref 3.5–5.1)
Sodium: 132 mmol/L — ABNORMAL LOW (ref 135–145)
Total Bilirubin: 0.4 mg/dL (ref <1.2)
Total Protein: 7.8 g/dL (ref 6.5–8.1)

## 2022-12-15 LAB — HCG, SERUM, QUALITATIVE: Preg, Serum: NEGATIVE

## 2022-12-15 LAB — GROUP A STREP BY PCR: Group A Strep by PCR: NOT DETECTED

## 2022-12-15 LAB — LIPASE, BLOOD: Lipase: 30 U/L (ref 11–51)

## 2022-12-15 MED ORDER — LIDOCAINE HCL 1 % IJ SOLN
INTRAMUSCULAR | Status: AC
  Filled 2022-12-15: qty 20

## 2022-12-15 MED ORDER — MORPHINE SULFATE (PF) 4 MG/ML IV SOLN
4.0000 mg | Freq: Once | INTRAVENOUS | Status: AC
  Administered 2022-12-15: 4 mg via INTRAVENOUS
  Filled 2022-12-15: qty 1

## 2022-12-15 MED ORDER — HEPARIN SOD (PORK) LOCK FLUSH 100 UNIT/ML IV SOLN
INTRAVENOUS | Status: AC
  Filled 2022-12-15: qty 5

## 2022-12-15 MED ORDER — NYSTATIN 100000 UNIT/ML MT SUSP
5.0000 mL | Freq: Once | OROMUCOSAL | Status: AC
  Administered 2022-12-15: 500000 [IU] via OROMUCOSAL
  Filled 2022-12-15: qty 5

## 2022-12-15 MED ORDER — POTASSIUM CHLORIDE CRYS ER 20 MEQ PO TBCR
40.0000 meq | EXTENDED_RELEASE_TABLET | Freq: Once | ORAL | Status: AC
  Administered 2022-12-15: 40 meq via ORAL
  Filled 2022-12-15: qty 2

## 2022-12-15 MED ORDER — POTASSIUM CHLORIDE 10 MEQ/100ML IV SOLN
10.0000 meq | Freq: Once | INTRAVENOUS | Status: AC
Start: 2022-12-15 — End: 2022-12-15
  Administered 2022-12-15: 10 meq via INTRAVENOUS
  Filled 2022-12-15: qty 100

## 2022-12-15 MED ORDER — HYDROMORPHONE HCL 1 MG/ML IJ SOLN
1.0000 mg | Freq: Once | INTRAMUSCULAR | Status: AC
  Administered 2022-12-15: 1 mg via INTRAVENOUS
  Filled 2022-12-15: qty 1

## 2022-12-15 MED ORDER — METOCLOPRAMIDE HCL 5 MG/ML IJ SOLN
5.0000 mg | Freq: Once | INTRAMUSCULAR | Status: AC
  Administered 2022-12-15: 5 mg via INTRAVENOUS
  Filled 2022-12-15: qty 2

## 2022-12-15 MED ORDER — METOCLOPRAMIDE HCL 5 MG/ML IJ SOLN
5.0000 mg | Freq: Once | INTRAMUSCULAR | Status: AC
Start: 2022-12-15 — End: 2022-12-15
  Administered 2022-12-15: 5 mg via INTRAVENOUS
  Filled 2022-12-15: qty 2

## 2022-12-15 MED ORDER — IOHEXOL 300 MG/ML  SOLN
100.0000 mL | Freq: Once | INTRAMUSCULAR | Status: AC | PRN
  Administered 2022-12-15: 100 mL via INTRAVENOUS

## 2022-12-15 NOTE — ED Provider Notes (Addendum)
[place] EMERGENCY DEPARTMENT AT [facility] Provider Note   CSN: [id number] Arrival date & time: 12/15/22  1536     History  Chief Complaint  Patient presents with   Generalized Body Aches   Nausea   Emesis   Mass    April [name] is a 47 y.o. female.  HPI   47 year old female with past medical history of colon cancer undergoing chemotherapy presents to the emergency department with low-grade fever/chills, nausea/vomiting, sore throat and right sided swelling underneath her chin.  Patient states symptoms have been ongoing for the past 3 to 4 days.  She was at the IR suite today to have a PICC line placed and secondary to her symptoms and we can states she was sent here for further evaluation.  Patient denies any acute chest pain or shortness of breath.  Denies any ear pain.  Home Medications Prior to Admission medications   Medication Sig Start Date End Date Taking? Authorizing Provider  acetaminophen (ACETAMINOPHEN 8 HOUR) 650 MG CR tablet Take 650 mg by mouth every 8 (eight) hours as needed for pain.    [provider]  amLODipine (NORVASC) 10 MG tablet Take 1 tablet (10 mg total) by mouth daily. 11/24/22   Rodolph Bong, MD  chlorthalidone (HYGROTON) 25 MG tablet Take 1 tablet (25 mg total) by mouth daily. 11/24/22   Rodolph Bong, MD  dexamethasone (DECADRON) 4 MG tablet Take 1 tablet by mouth twice a day for 3 days beginning day 2 of chemo. Patient not taking: Reported on 11/11/2022 08/04/22   Rana Snare, NP  diclofenac Sodium (VOLTAREN) 1 % [employer] Patient: Apply 0.5 grams (1 fingertip) to each hand and each foot twice daily for up to 12 weeks Patient not taking: Reported on 11/11/2022 10/27/22   Ladene Artist, MD  dicyclomine (BENTYL) 20 MG tablet Take 1 tablet (20 mg total) by mouth 3 (three) times daily as needed for spasms (rectal/AB pain). 11/24/22   Rodolph Bong, MD  HYDROcodone-acetaminophen [facility]) 10-325 MG  tablet Take 1-2 tablets by mouth every 6 (six) hours as needed. 12/08/22   Rana Snare, NP  metoprolol tartrate (LOPRESSOR) 50 MG tablet Take 1 tablet (50 mg total) by mouth 2 (two) times daily. 11/24/22   Rodolph Bong, MD  mometasone-formoterol (DULERA) 200-5 MCG/ACT AERO Inhale 2 puffs into the lungs 2 (two) times daily. 11/24/22   Rodolph Bong, MD  morphine (MS CONTIN) 15 MG 12 hr tablet Take 1 tablet (15 mg total) by mouth every 12 (twelve) hours. 12/03/22   Rana Snare, NP  polyethylene glycol powder (MIRALAX) 17 GM/SCOOP powder Take 17 g by mouth 2 (two) times daily. 11/24/22   Rodolph Bong, MD  potassium chloride (MICRO-K) 10 MEQ CR capsule Take 2 capsules (20 mEq total) by mouth 2 (two) times daily. 11/24/22   Rodolph Bong, MD  prochlorperazine (COMPAZINE) 10 MG tablet Take 1 tablet (10 mg total) by mouth every 6 (six) hours as needed for nausea or vomiting. 04/28/22   Rana Snare, NP  senna-docusate (SENOKOT-S) 8.6-50 MG tablet Take 1 tablet by mouth 2 (two) times daily. 11/24/22   Rodolph Bong, MD  umeclidinium bromide (INCRUSE ELLIPTA) 62.5 MCG/ACT AEPB Inhale 1 puff into the lungs daily. 11/25/22   Rodolph Bong, MD      Allergies    Lisinopril, Irinotecan, Leucovorin calcium, and Oxaliplatin    Review of Systems   Review of Systems  Constitutional:  Positive for chills, fatigue and fever.  HENT:  Positive for sore throat. Negative for ear pain, trouble swallowing and voice change.   Respiratory:  Positive for cough. Negative for shortness of breath.   Cardiovascular:  Negative for chest pain and leg swelling.  Gastrointestinal:  Positive for abdominal pain, nausea and vomiting. Negative for diarrhea.  Musculoskeletal:  Negative for neck pain.  Skin:  Negative for rash.  Neurological:  Negative for headaches.    Physical Exam Updated Vital Signs BP (!) 138/102 (BP Location: Right Arm)   Pulse 90   Temp 99.7 F (37.6 C) (Oral)    Resp 17   LMP 01/07/2006   SpO2 99%  Physical Exam Vitals and nursing note reviewed.  Constitutional:      Appearance: Normal appearance.  HENT:     Head: Normocephalic.     Mouth/Throat:     Mouth: Mucous membranes are moist.     Pharynx: Posterior oropharyngeal erythema present.     Comments: White covering of the tongue and small areas of the back of the throat but no exudates Neck:     Comments: Mild to moderate swelling in the right submandibular area that is tender to touch and feels like a firm lymph node/lymphadenopathy Cardiovascular:     Rate and Rhythm: Normal rate.  Pulmonary:     Effort: Pulmonary effort is normal. No respiratory distress.  Abdominal:     Palpations: Abdomen is soft.     Tenderness: There is no abdominal tenderness.  Skin:    General: Skin is warm.  Neurological:     Mental Status: She is alert and oriented to person, place, and time. Mental status is at baseline.  Psychiatric:        Mood and Affect: Mood normal.     ED Results / Procedures / Treatments   Labs (all labs ordered are listed, but only abnormal results are displayed) Labs Reviewed  RESP PANEL BY RT-PCR (RSV, FLU A&B, COVID)  [id number]  GROUP A STREP BY PCR  CBC WITH DIFFERENTIAL/PLATELET  BASIC METABOLIC PANEL  HCG, SERUM, QUALITATIVE    EKG None  Radiology IR PATIENT EVAL TECH 0-60 MINS  Result Date: 12/15/2022 Arby Barrette     12/15/2022  4:25 PM  Patient ID: [name], female   DOB: 08/02/1975, 35 y.o.   MRN: [id number] Pt presented to IR dept today as OP for PICC placement to assist with chemotherapy for met colon cancer. On presentation pt appeared weak and also c/o low grade fevers/chills/sweats, N/V , abd/back pain and rt sided neck adenopathy. Above was discussed with ordering team/April Thomas,NP with oncology and they advised to send pt to ED for further evaluation. PICC placement canceled for now.      Procedures Procedures    Medications Ordered in  ED Medications - No data to display  ED Course/ Medical Decision Making/ A&P                                 Medical Decision Making Amount and/or Complexity of Data Reviewed Labs: ordered. Radiology: ordered.  Risk Prescription drug management.   47 year old female presents emergency department with sore throat, abdominal pain, nausea/vomiting, chills and generalized aches.  Vital signs are stable on arrival.  Patient is very fatigued appearing.  In regards to the sore throat it appears like she has thrush, strep test is negative.  Respiratory panel is negative.  Blood work shows dehydration, hypokalemia.  2 IVs have infiltrated and we have been unable to adequately replace.  Patient is tolerating p.o. poorly secondary to thrush I would presume.  In regards to the submandibular swelling and abdominal pain with a history of cancer we will pursue CT imaging.  If no acute findings on this we will plan for admission for general decline and hypokalemia.  Patient signed out pending imaging.         Final Clinical Impression(s) / ED Diagnoses Final diagnoses:  None    Rx / DC Orders ED Discharge Orders     None         Rozelle Logan, DO 12/15/22 2352

## 2022-12-15 NOTE — Telephone Encounter (Signed)
Called April [name] to f/u on her call to [facility] on 12/14/22. She reports the "knot" on side of neck has diminished in size since 12/01, but is still noted. She had some nausea and sweats (did not check temp) over weekend, but is better today. She is having PICC placed today. Will have provider check her tomorrow when here for treatment if it is still swollen.

## 2022-12-15 NOTE — ED Triage Notes (Signed)
Pt is coming from IR Radiology transferred to ED due to n/v, cough, chills, generalized body aches, abdominal pain. Pt reports feeling symptoms for 3 days. Pt also reports lump on neck with pain on palpation since yesterday.

## 2022-12-15 NOTE — Progress Notes (Addendum)
Patient ID: [name], female   DOB: 1975/04/17, 48 y.o.   MRN: [id number] Pt presented to IR dept today as OP for PICC placement to assist with chemotherapy for met colon cancer. On presentation pt appeared weak and also c/o low grade fevers/chills/sweats, N/V , abd/back pain and rt sided neck adenopathy. Above was discussed with ordering team/Lisa Thomas,NP with oncology and they advised to send pt to ED for further evaluation. PICC placement canceled for now.

## 2022-12-15 NOTE — ED Notes (Signed)
Pt noted to have Infiltration in second IV, and second IV removed. Provider notified. K+ infusion stopped. Pt currently refusing any more IV's

## 2022-12-15 NOTE — ED Notes (Signed)
Contacted lab for lipase ad-on

## 2022-12-15 NOTE — Procedures (Signed)
   Patient ID: April [name], female   DOB: [birth year], 47 y.o.   MRN: [id number] Pt presented to IR dept today as OP for PICC placement to assist with chemotherapy for met colon cancer. On presentation pt appeared weak and also c/o low grade fevers/chills/sweats, N/V , abd/back pain and rt sided neck adenopathy. Above was discussed with ordering team/Lisa Thomas,NP with oncology and they advised to send pt to ED for further evaluation. PICC placement canceled for now.

## 2022-12-15 NOTE — ED Notes (Signed)
Upon arrival of staff memebr, when checking Pt, noted IV was infiltrated with K+ running. Infusion paused and IV removed. Provider notified

## 2022-12-16 ENCOUNTER — Inpatient Hospital Stay: Payer: 59

## 2022-12-16 ENCOUNTER — Other Ambulatory Visit: Payer: Self-pay | Admitting: Oncology

## 2022-12-16 ENCOUNTER — Other Ambulatory Visit: Payer: Self-pay

## 2022-12-16 ENCOUNTER — Encounter: Payer: Self-pay | Admitting: Oncology

## 2022-12-16 ENCOUNTER — Encounter (HOSPITAL_COMMUNITY): Payer: Self-pay | Admitting: Family Medicine

## 2022-12-16 DIAGNOSIS — I7121 Aneurysm of the ascending aorta, without rupture: Secondary | ICD-10-CM | POA: Diagnosis present

## 2022-12-16 DIAGNOSIS — Z87891 Personal history of nicotine dependence: Secondary | ICD-10-CM | POA: Diagnosis not present

## 2022-12-16 DIAGNOSIS — K1120 Sialoadenitis, unspecified: Secondary | ICD-10-CM | POA: Diagnosis present

## 2022-12-16 DIAGNOSIS — K831 Obstruction of bile duct: Secondary | ICD-10-CM

## 2022-12-16 DIAGNOSIS — Z888 Allergy status to other drugs, medicaments and biological substances status: Secondary | ICD-10-CM | POA: Diagnosis not present

## 2022-12-16 DIAGNOSIS — R6884 Jaw pain: Secondary | ICD-10-CM | POA: Diagnosis present

## 2022-12-16 DIAGNOSIS — Z833 Family history of diabetes mellitus: Secondary | ICD-10-CM | POA: Diagnosis not present

## 2022-12-16 DIAGNOSIS — Z87442 Personal history of urinary calculi: Secondary | ICD-10-CM | POA: Diagnosis not present

## 2022-12-16 DIAGNOSIS — Z823 Family history of stroke: Secondary | ICD-10-CM | POA: Diagnosis not present

## 2022-12-16 DIAGNOSIS — R112 Nausea with vomiting, unspecified: Secondary | ICD-10-CM | POA: Diagnosis not present

## 2022-12-16 DIAGNOSIS — Z7951 Long term (current) use of inhaled steroids: Secondary | ICD-10-CM | POA: Diagnosis not present

## 2022-12-16 DIAGNOSIS — C20 Malignant neoplasm of rectum: Secondary | ICD-10-CM | POA: Diagnosis present

## 2022-12-16 DIAGNOSIS — E860 Dehydration: Secondary | ICD-10-CM | POA: Diagnosis present

## 2022-12-16 DIAGNOSIS — Z8249 Family history of ischemic heart disease and other diseases of the circulatory system: Secondary | ICD-10-CM | POA: Diagnosis not present

## 2022-12-16 DIAGNOSIS — I10 Essential (primary) hypertension: Secondary | ICD-10-CM

## 2022-12-16 DIAGNOSIS — E876 Hypokalemia: Secondary | ICD-10-CM | POA: Diagnosis present

## 2022-12-16 DIAGNOSIS — Z79899 Other long term (current) drug therapy: Secondary | ICD-10-CM | POA: Diagnosis not present

## 2022-12-16 DIAGNOSIS — G893 Neoplasm related pain (acute) (chronic): Secondary | ICD-10-CM | POA: Diagnosis present

## 2022-12-16 DIAGNOSIS — C786 Secondary malignant neoplasm of retroperitoneum and peritoneum: Secondary | ICD-10-CM | POA: Diagnosis present

## 2022-12-16 DIAGNOSIS — J439 Emphysema, unspecified: Secondary | ICD-10-CM | POA: Diagnosis present

## 2022-12-16 DIAGNOSIS — Z85038 Personal history of other malignant neoplasm of large intestine: Secondary | ICD-10-CM | POA: Diagnosis not present

## 2022-12-16 DIAGNOSIS — C19 Malignant neoplasm of rectosigmoid junction: Secondary | ICD-10-CM

## 2022-12-16 DIAGNOSIS — K1121 Acute sialoadenitis: Secondary | ICD-10-CM | POA: Diagnosis present

## 2022-12-16 HISTORY — DX: Aneurysm of the ascending aorta, without rupture: I71.21

## 2022-12-16 HISTORY — DX: Sialoadenitis, unspecified: K11.20

## 2022-12-16 LAB — MAGNESIUM: Magnesium: 1.9 mg/dL (ref 1.7–2.4)

## 2022-12-16 LAB — URINALYSIS, ROUTINE W REFLEX MICROSCOPIC
Bacteria, UA: NONE SEEN
Bilirubin Urine: NEGATIVE
Glucose, UA: NEGATIVE mg/dL
Ketones, ur: 5 mg/dL — AB
Leukocytes,Ua: NEGATIVE
Nitrite: NEGATIVE
Protein, ur: NEGATIVE mg/dL
pH: 6 (ref 5.0–8.0)

## 2022-12-16 LAB — HEPATIC FUNCTION PANEL
ALT: 28 U/L (ref 0–44)
AST: 37 U/L (ref 15–41)
Albumin: 3.2 g/dL — ABNORMAL LOW (ref 3.5–5.0)
Alkaline Phosphatase: 204 U/L — ABNORMAL HIGH (ref 38–126)
Bilirubin, Direct: 0.1 mg/dL (ref 0.0–0.2)
Indirect Bilirubin: 0.4 mg/dL (ref 0.3–0.9)
Total Bilirubin: 0.5 mg/dL (ref <1.2)
Total Protein: 7.1 g/dL (ref 6.5–8.1)

## 2022-12-16 LAB — CBC
HCT: 35.8 % — ABNORMAL LOW (ref 36.0–46.0)
Hemoglobin: 11.6 g/dL — ABNORMAL LOW (ref 12.0–15.0)
MCH: 30.3 pg (ref 26.0–34.0)
MCHC: 32.4 g/dL (ref 30.0–36.0)
MCV: 93.5 fL (ref 80.0–100.0)
Platelets: 272 10*3/uL (ref 150–400)
RBC: 3.83 MIL/uL — ABNORMAL LOW (ref 3.87–5.11)
RDW: 13.3 % (ref 11.5–15.5)
WBC: 7.6 10*3/uL (ref 4.0–10.5)
nRBC: 0 % (ref 0.0–0.2)

## 2022-12-16 LAB — BASIC METABOLIC PANEL
Anion gap: 6 (ref 5–15)
BUN: 8 mg/dL (ref 6–20)
CO2: 29 mmol/L (ref 22–32)
Calcium: 8.5 mg/dL — ABNORMAL LOW (ref 8.9–10.3)
Chloride: 97 mmol/L — ABNORMAL LOW (ref 98–111)
Creatinine, Ser: 0.52 mg/dL (ref 0.44–1.00)
GFR, Estimated: >60 mL/min (ref >60)
Glucose, Bld: 97 mg/dL (ref 70–99)
Potassium: 2.9 mmol/L — ABNORMAL LOW (ref 3.5–5.1)
Sodium: 132 mmol/L — ABNORMAL LOW (ref 135–145)

## 2022-12-16 LAB — PHOSPHORUS: Phosphorus: 3 mg/dL (ref 2.5–4.6)

## 2022-12-16 MED ORDER — SODIUM CHLORIDE 0.9 % IV SOLN
3.0000 g | Freq: Four times a day (QID) | INTRAVENOUS | Status: DC
  Administered 2022-12-16 – 2022-12-18 (×10): 3 g via INTRAVENOUS
  Filled 2022-12-16 (×10): qty 8

## 2022-12-16 MED ORDER — DICYCLOMINE HCL 20 MG PO TABS: 20.0000 mg | ORAL_TABLET | Freq: Three times a day (TID) | ORAL | Status: DC | PRN

## 2022-12-16 MED ORDER — POTASSIUM CHLORIDE CRYS ER 20 MEQ PO TBCR
20.0000 meq | EXTENDED_RELEASE_TABLET | Freq: Three times a day (TID) | ORAL | Status: AC
  Administered 2022-12-16 (×3): 20 meq via ORAL
  Filled 2022-12-16 (×3): qty 1

## 2022-12-16 MED ORDER — ACETAMINOPHEN 650 MG RE SUPP: 650.0000 mg | Freq: Four times a day (QID) | RECTAL | Status: DC | PRN

## 2022-12-16 MED ORDER — AMLODIPINE BESYLATE 10 MG PO TABS
10.0000 mg | ORAL_TABLET | Freq: Every day | ORAL | Status: DC
  Administered 2022-12-16 – 2022-12-18 (×3): 10 mg via ORAL
  Filled 2022-12-16: qty 2
  Filled 2022-12-16 (×2): qty 1

## 2022-12-16 MED ORDER — ONDANSETRON HCL 4 MG PO TABS: 4.0000 mg | ORAL_TABLET | Freq: Four times a day (QID) | ORAL | Status: DC | PRN

## 2022-12-16 MED ORDER — ACETAMINOPHEN 325 MG PO TABS: 650.0000 mg | ORAL_TABLET | Freq: Four times a day (QID) | ORAL | Status: DC | PRN

## 2022-12-16 MED ORDER — METOPROLOL TARTRATE 25 MG PO TABS
50.0000 mg | ORAL_TABLET | Freq: Once | ORAL | Status: AC
  Administered 2022-12-16: 50 mg via ORAL
  Filled 2022-12-16: qty 2

## 2022-12-16 MED ORDER — ALBUTEROL SULFATE (2.5 MG/3ML) 0.083% IN NEBU: 2.5000 mg | INHALATION_SOLUTION | RESPIRATORY_TRACT | Status: DC | PRN

## 2022-12-16 MED ORDER — OXYCODONE HCL 5 MG PO TABS
5.0000 mg | ORAL_TABLET | ORAL | Status: DC | PRN
  Administered 2022-12-16 – 2022-12-18 (×4): 5 mg via ORAL
  Filled 2022-12-16 (×4): qty 1

## 2022-12-16 MED ORDER — SODIUM CHLORIDE 0.9% FLUSH: 10.0000 mL | INTRAVENOUS | Status: DC | PRN

## 2022-12-16 MED ORDER — CHLORHEXIDINE GLUCONATE CLOTH 2 % EX PADS
6.0000 | MEDICATED_PAD | Freq: Every day | CUTANEOUS | Status: DC
Start: 2022-12-16 — End: 2022-12-18
  Administered 2022-12-17: 6 via TOPICAL

## 2022-12-16 MED ORDER — POTASSIUM CHLORIDE 10 MEQ/100ML IV SOLN
10.0000 meq | INTRAVENOUS | Status: AC
  Administered 2022-12-16 (×3): 10 meq via INTRAVENOUS
  Filled 2022-12-16 (×2): qty 100

## 2022-12-16 MED ORDER — LACTATED RINGERS IV SOLN: INTRAVENOUS | Status: DC

## 2022-12-16 MED ORDER — UMECLIDINIUM BROMIDE 62.5 MCG/ACT IN AEPB
1.0000 | INHALATION_SPRAY | Freq: Every day | RESPIRATORY_TRACT | Status: DC
  Administered 2022-12-17 – 2022-12-18 (×2): 1 via RESPIRATORY_TRACT
  Filled 2022-12-16: qty 7

## 2022-12-16 MED ORDER — SENNOSIDES-DOCUSATE SODIUM 8.6-50 MG PO TABS
1.0000 | ORAL_TABLET | Freq: Two times a day (BID) | ORAL | Status: DC
  Administered 2022-12-16 – 2022-12-18 (×3): 1 via ORAL
  Filled 2022-12-16 (×4): qty 1

## 2022-12-16 MED ORDER — PANTOPRAZOLE SODIUM 40 MG IV SOLR
40.0000 mg | Freq: Two times a day (BID) | INTRAVENOUS | Status: DC
  Administered 2022-12-16 – 2022-12-18 (×5): 40 mg via INTRAVENOUS
  Filled 2022-12-16 (×5): qty 10

## 2022-12-16 MED ORDER — DEXTROSE 5 % IV SOLN: Freq: Once | INTRAVENOUS | Status: AC

## 2022-12-16 MED ORDER — BISACODYL 5 MG PO TBEC
5.0000 mg | DELAYED_RELEASE_TABLET | Freq: Every day | ORAL | Status: DC | PRN
  Administered 2022-12-16: 5 mg via ORAL
  Filled 2022-12-16: qty 1

## 2022-12-16 MED ORDER — NYSTATIN 100000 UNIT/ML MT SUSP
5.0000 mL | Freq: Four times a day (QID) | OROMUCOSAL | Status: DC
  Filled 2022-12-16: qty 5

## 2022-12-16 MED ORDER — ONDANSETRON HCL 4 MG/2ML IJ SOLN: 4.0000 mg | Freq: Four times a day (QID) | INTRAMUSCULAR | Status: DC | PRN

## 2022-12-16 MED ORDER — MOMETASONE FURO-FORMOTEROL FUM 200-5 MCG/ACT IN AERO
2.0000 | INHALATION_SPRAY | Freq: Two times a day (BID) | RESPIRATORY_TRACT | Status: DC
Start: 2022-12-16 — End: 2022-12-18
  Administered 2022-12-16 – 2022-12-18 (×4): 2 via RESPIRATORY_TRACT
  Filled 2022-12-16: qty 8.8

## 2022-12-16 MED ORDER — SODIUM CHLORIDE 0.9% FLUSH
3.0000 mL | Freq: Two times a day (BID) | INTRAVENOUS | Status: DC
  Administered 2022-12-16 – 2022-12-17 (×3): 3 mL via INTRAVENOUS

## 2022-12-16 MED ORDER — POLYETHYLENE GLYCOL 3350 17 G PO PACK
17.0000 g | PACK | Freq: Two times a day (BID) | ORAL | Status: DC
  Administered 2022-12-16 – 2022-12-18 (×3): 17 g via ORAL
  Filled 2022-12-16 (×4): qty 1

## 2022-12-16 MED ORDER — POTASSIUM CHLORIDE 10 MEQ/100ML IV SOLN
10.0000 meq | INTRAVENOUS | Status: AC
  Filled 2022-12-16: qty 100

## 2022-12-16 MED ORDER — MORPHINE SULFATE ER 15 MG PO TBCR
15.0000 mg | EXTENDED_RELEASE_TABLET | Freq: Two times a day (BID) | ORAL | Status: DC
  Administered 2022-12-16 – 2022-12-18 (×4): 15 mg via ORAL
  Filled 2022-12-16 (×4): qty 1

## 2022-12-16 MED ORDER — METOPROLOL TARTRATE 50 MG PO TABS
50.0000 mg | ORAL_TABLET | Freq: Two times a day (BID) | ORAL | Status: DC
  Administered 2022-12-16 – 2022-12-18 (×4): 50 mg via ORAL
  Filled 2022-12-16: qty 1
  Filled 2022-12-16: qty 2
  Filled 2022-12-16 (×3): qty 1

## 2022-12-16 MED ORDER — LACTATED RINGERS IV SOLN: INTRAVENOUS | Status: AC

## 2022-12-16 MED ORDER — HYDROMORPHONE HCL 1 MG/ML IJ SOLN
0.5000 mg | INTRAMUSCULAR | Status: DC | PRN
  Administered 2022-12-16 – 2022-12-18 (×6): 1 mg via INTRAVENOUS
  Filled 2022-12-16 (×6): qty 1

## 2022-12-16 MED ORDER — SODIUM CHLORIDE 0.9% FLUSH
10.0000 mL | Freq: Two times a day (BID) | INTRAVENOUS | Status: DC
  Administered 2022-12-16 – 2022-12-18 (×3): 10 mL

## 2022-12-16 MED ORDER — ENOXAPARIN SODIUM 40 MG/0.4ML IJ SOSY
40.0000 mg | PREFILLED_SYRINGE | INTRAMUSCULAR | Status: DC
  Administered 2022-12-16 – 2022-12-18 (×3): 40 mg via SUBCUTANEOUS
  Filled 2022-12-16 (×3): qty 0.4

## 2022-12-16 NOTE — Plan of Care (Signed)
Pt given scheduled and prn dose of stool softeners. Problem: Education: Goal: Knowledge of General Education information will improve Description: Including pain rating scale, medication(s)/side effects and non-pharmacologic comfort measures Outcome: Progressing   Problem: Health Behavior/Discharge Planning: Goal: Ability to manage health-related needs will improve Outcome: Progressing   Problem: Clinical Measurements: Goal: Ability to maintain clinical measurements within normal limits will improve Outcome: Progressing Goal: Will remain free from infection Outcome: Progressing Goal: Diagnostic test results will improve Outcome: Progressing Goal: Respiratory complications will improve Outcome: Progressing Goal: Cardiovascular complication will be avoided Outcome: Progressing   Problem: Activity: Goal: Risk for activity intolerance will decrease Outcome: Progressing   Problem: Nutrition: Goal: Adequate nutrition will be maintained Outcome: Progressing   Problem: Coping: Goal: Level of anxiety will decrease Outcome: Progressing   Problem: Elimination: Goal: Will not experience complications related to urinary retention Outcome: Progressing   Problem: Pain Management: Goal: General experience of comfort will improve Outcome: Progressing   Problem: Safety: Goal: Ability to remain free from injury will improve Outcome: Progressing   Problem: Skin Integrity: Goal: Risk for impaired skin integrity will decrease Outcome: Progressing

## 2022-12-16 NOTE — ED Notes (Signed)
ED TO INPATIENT HANDOFF REPORT  ED Nurse Name and Phone #: L. Kamrynn Melott RN  S Name/Age/Gender April [name] 47 y.o. female Room/Bed: WA11/WA11  Code Status   Code Status: Full Code  Home/SNF/Other Home Patient oriented to: self, place, time, and situation Is this baseline?  Yes  Triage Complete: Triage complete  Chief Complaint Dehydration [E86.0]  Triage Note Pt is coming from IR Radiology transferred to ED due to n/v, cough, chills, generalized body aches, abdominal pain. Pt reports feeling symptoms for 3 days. Pt also reports lump on neck with pain on palpation since yesterday.   Allergies Allergies  Allergen Reactions   Lisinopril Swelling and Other (See Comments)    Lip swelling and Angioedema   Irinotecan Other (See Comments)    Stomach cramping Patient given 0.5 mL atropine IV and was able to continue infusion.  See Progress note on 10/03/20.    Leucovorin Calcium Itching    Patient reported itchy palms towards end of transfusion, see progress note 08/04/22.    Oxaliplatin Itching    Patient reported itchy palms towards end of transfusion, see progress note on 08/04/22.    Level of Care/Admitting Diagnosis ED Disposition     ED Disposition  Admit   Condition  --   Comment  Hospital Area: [facility] [[id number]]  Level of Care: Telemetry [5]  Admit to tele based on following criteria: Monitor QTC interval  May place patient in observation at [facility] or [facility] if equivalent level of care is available:: Yes  Covid Evaluation: Confirmed COVID Negative  Diagnosis: Dehydration [276.51.ICD-9-CM]  Admitting Physician: Briscoe Deutscher [[id number]]  Attending Physician: Briscoe Deutscher [[id number]]          B Medical/Surgery History Past Medical History:  Diagnosis Date   Benign essential hypertension 10/06/2017   Last Assessment & Plan:  Formatting of this note might be different from the original. Patient's blood pressure noted to be  elevated at today's visit 188/98.  Patient will be provided with antihypertensives in the treatment center if needed in order to meet her Avastin parameters.  Patient recommended to follow up with her primary care physician regarding medication management.  Patient is not cur   Cancer [facility])    Colon cancer (HCC)    Hypertension    Renal calculi    bilateral   Past Surgical History:  Procedure Laterality Date   BILIARY BRUSHING  03/24/2022   Procedure: BILIARY BRUSHING;  Surgeon: Meridee Score Netty Starring., MD;  Location: [facility] ENDOSCOPY;  Service: Gastroenterology;;   BILIARY DILATION  03/24/2022   Procedure: BILIARY DILATION;  Surgeon: Lemar Lofty., MD;  Location: [facility] ENDOSCOPY;  Service: Gastroenterology;;   BILIARY STENT PLACEMENT  03/24/2022   Procedure: BILIARY STENT PLACEMENT;  Surgeon: Lemar Lofty., MD;  Location: [facility] ENDOSCOPY;  Service: Gastroenterology;;   BIOPSY  03/24/2022   Procedure: BIOPSY;  Surgeon: Lemar Lofty., MD;  Location: [facility] ENDOSCOPY;  Service: Gastroenterology;;   BIOPSY  11/23/2022   Procedure: BIOPSY;  Surgeon: Hilarie Fredrickson, MD;  Location: [facility] ENDOSCOPY;  Service: Gastroenterology;;   CESAREAN SECTION     x2   COLONOSCOPY N/A 11/23/2022   Procedure: COLONOSCOPY;  Surgeon: Hilarie Fredrickson, MD;  Location: [facility] ENDOSCOPY;  Service: Gastroenterology;  Laterality: N/A;   CYSTOSCOPY WITH RETROGRADE PYELOGRAM, URETEROSCOPY AND STENT PLACEMENT Right 10/22/2020   Procedure: CYSTOSCOPY WITH RETROGRADE PYELOGRAM, URETEROSCOPY AND STENT PLACEMENT;  Surgeon: Noel Christmas, MD;  Location: [facility] ORS;  Service: Urology;  Laterality: Right;   ERCP N/A 03/24/2022   Procedure: ENDOSCOPIC RETROGRADE CHOLANGIOPANCREATOGRAPHY (ERCP);  Surgeon: Lemar Lofty., MD;  Location: [facility] ENDOSCOPY;  Service: Gastroenterology;  Laterality: N/A;   IR PATIENT EVAL TECH 0-60 MINS  12/15/2022   IR RADIOLOGIST EVAL & MGMT  09/16/2019   IR RADIOLOGIST EVAL & MGMT  09/23/2019    IR RADIOLOGIST EVAL & MGMT  09/20/2019   IR RADIOLOGIST EVAL & MGMT  09/30/2019   IR REMOVAL TUN ACCESS W/ PORT W/O FL MOD SED  09/14/2019   IR VENO/EXT/UNI RIGHT  04/11/2022   PANCREATIC STENT PLACEMENT  03/24/2022   Procedure: PANCREATIC STENT PLACEMENT;  Surgeon: Lemar Lofty., MD;  Location: [facility] ENDOSCOPY;  Service: Gastroenterology;;   REMOVAL OF STONES  03/24/2022   Procedure: REMOVAL OF STONES;  Surgeon: Lemar Lofty., MD;  Location: [facility] ENDOSCOPY;  Service: Gastroenterology;;   Dennison Mascot  03/24/2022   Procedure: Dennison Mascot;  Surgeon: Mansouraty, Netty Starring., MD;  Location: [facility] ENDOSCOPY;  Service: Gastroenterology;;   URETERAL STENT PLACEMENT       A IV Location/Drains/Wounds Patient Lines/Drains/Airways Status     Active Line/Drains/Airways     Name Placement date Placement time Site Days   Peripheral IV 12/15/22 22 G 1.75" Anterior;Right Forearm 12/15/22  2113  Forearm  1   Peripheral IV 12/16/22 22 G 1.75" Left;Anterior Forearm 12/16/22  1042  Forearm  less than 1   Ureteral Drain/Stent Right ureter 6 Fr. 10/23/20  0011  Right ureter  784   GI Stent 03/24/22  1504  --  267   GI Stent 03/24/22  1548  --  267   GI Stent 03/24/22  1551  --  267            Intake/Output Last 24 hours  Intake/Output Summary (Last 24 hours) at 12/16/2022 1051 Last data filed at 12/15/2022 2329 Gross per 24 hour  Intake 100 ml  Output --  Net 100 ml    Labs/Imaging Results for orders placed or performed during the hospital encounter of 12/15/22 (from the past 48 hour(s))  Resp panel by RT-PCR (RSV, Flu A&B, Covid) Anterior Nasal Swab     Status: None   Collection Time: 12/15/22  5:19 PM   Specimen: Anterior Nasal Swab  Result Value Ref Range   SARS Coronavirus 2 by RT PCR NEGATIVE NEGATIVE    Comment: (NOTE) SARS-CoV-2 target nucleic acids are NOT DETECTED.  The SARS-CoV-2 RNA is generally detectable in upper respiratory specimens during the acute phase of  infection. The lowest concentration of SARS-CoV-2 viral copies this assay can detect is 138 copies/mL. A negative result does not preclude SARS-Cov-2 infection and should not be used as the sole basis for treatment or other patient management decisions. A negative result may occur with  improper specimen collection/handling, submission of specimen other than nasopharyngeal swab, presence of viral mutation(s) within the areas targeted by this assay, and inadequate number of viral copies(<138 copies/mL). A negative result must be combined with clinical observations, patient history, and epidemiological information. The expected result is Negative.  Fact Sheet for Patients:  BloggerCourse.com  Fact Sheet for Healthcare Providers:  SeriousBroker.it  This test is no t yet approved or cleared by the [place] FDA and  has been authorized for detection and/or diagnosis of SARS-CoV-2 by FDA under an Emergency Use Authorization (EUA). This EUA will remain  in effect (meaning this test can be used) for the duration of the COVID-19 declaration under Section 564(b)(1) of the  Act, 21 U.S.C.section 360bbb-3(b)(1), unless the authorization is terminated  or revoked sooner.       Influenza A by PCR NEGATIVE NEGATIVE   Influenza B by PCR NEGATIVE NEGATIVE    Comment: (NOTE) The Xpert Xpress SARS-CoV-2/FLU/RSV plus assay is intended as an aid in the diagnosis of influenza from Nasopharyngeal swab specimens and should not be used as a sole basis for treatment. Nasal washings and aspirates are unacceptable for Xpert Xpress SARS-CoV-2/FLU/RSV testing.  Fact Sheet for Patients: BloggerCourse.com  Fact Sheet for Healthcare Providers: SeriousBroker.it  This test is not yet approved or cleared by the [place] FDA and has been authorized for detection and/or diagnosis of SARS-CoV-2 by FDA  under an Emergency Use Authorization (EUA). This EUA will remain in effect (meaning this test can be used) for the duration of the COVID-19 declaration under Section 564(b)(1) of the Act, 21 U.S.C. section 360bbb-3(b)(1), unless the authorization is terminated or revoked.     Resp Syncytial Virus by PCR NEGATIVE NEGATIVE    Comment: (NOTE) Fact Sheet for Patients: BloggerCourse.com  Fact Sheet for Healthcare Providers: SeriousBroker.it  This test is not yet approved or cleared by the [place] FDA and has been authorized for detection and/or diagnosis of SARS-CoV-2 by FDA under an Emergency Use Authorization (EUA). This EUA will remain in effect (meaning this test can be used) for the duration of the COVID-19 declaration under Section 564(b)(1) of the Act, 21 U.S.C. section 360bbb-3(b)(1), unless the authorization is terminated or revoked.  Performed at [facility], [address], [place], [place] [address]   Group A Strep by PCR     Status: None   Collection Time: 12/15/22  5:19 PM   Specimen: Anterior Nasal Swab; Sterile Swab  Result Value Ref Range   Group A Strep by PCR NOT DETECTED NOT DETECTED    Comment: Performed at [facility], [address], [place], [place] [address]  CBC with Differential     Status: None   Collection Time: 12/15/22  5:19 PM  Result Value Ref Range   WBC 6.8 4.0 - 10.5 K/uL   RBC 4.09 3.87 - 5.11 MIL/uL   Hemoglobin 12.8 12.0 - 15.0 g/dL   HCT 09.8 11.9 - 14.7 %   MCV 91.4 80.0 - 100.0 fL   MCH 31.3 26.0 - 34.0 pg   MCHC 34.2 30.0 - 36.0 g/dL   RDW 82.9 56.2 - 13.0 %   Platelets 298 150 - 400 K/uL   nRBC 0.0 0.0 - 0.2 %   Neutrophils Relative % 50 %   Neutro Abs 3.4 1.7 - 7.7 K/uL   Lymphocytes Relative 33 %   Lymphs Abs 2.2 0.7 - 4.0 K/uL   Monocytes Relative 11 %   Monocytes Absolute 0.7 0.1 - 1.0 K/uL   Eosinophils Relative 5 %    Eosinophils Absolute 0.4 0.0 - 0.5 K/uL   Basophils Relative 1 %   Basophils Absolute 0.0 0.0 - 0.1 K/uL   Immature Granulocytes 0 %   Abs Immature Granulocytes 0.01 0.00 - 0.07 K/uL    Comment: Performed at [facility], [address], [place], [place] [address]  hCG, serum, qualitative     Status: None   Collection Time: 12/15/22  5:19 PM  Result Value Ref Range   Preg, Serum NEGATIVE NEGATIVE    Comment:        THE SENSITIVITY OF THIS METHODOLOGY IS >10 mIU/mL. Performed at [facility], [address], [place], [place] [address]   Comprehensive metabolic panel     Status: Abnormal   Collection Time: 12/15/22  5:19 PM  Result Value Ref Range   Sodium 132 (L) 135 - 145 mmol/L   Potassium 2.8 (L) 3.5 - 5.1 mmol/L   Chloride 93 (L) 98 - 111 mmol/L   CO2 34 (H) 22 - 32 mmol/L   Glucose, Bld 87 70 - 99 mg/dL    Comment: Glucose reference range applies only to samples taken after fasting for at least 8 hours.   BUN 10 6 - 20 mg/dL   Creatinine, Ser 4.13 0.44 - 1.00 mg/dL   Calcium 8.8 (L) 8.9 - 10.3 mg/dL   Total Protein 7.8 6.5 - 8.1 g/dL   Albumin 3.6 3.5 - 5.0 g/dL   AST 32 15 - 41 U/L   ALT 28 0 - 44 U/L   Alkaline Phosphatase 212 (H) 38 - 126 U/L   Total Bilirubin 0.4 <1.2 mg/dL   GFR, Estimated >24 >40 mL/min    Comment: (NOTE) Calculated using the CKD-EPI Creatinine Equation (2021)    Anion gap 5 5 - 15    Comment: Performed at [facility], [address], [place], [place] [address]  Lipase, blood     Status: None   Collection Time: 12/15/22  5:28 PM  Result Value Ref Range   Lipase 30 11 - 51 U/L    Comment: Performed at [facility], [address], [place], [place] [address]  Urinalysis, Routine w reflex microscopic -Urine, Clean Catch     Status: Abnormal   Collection Time: 12/16/22  1:15 AM  Result Value Ref Range   Color, Urine YELLOW YELLOW   APPearance CLEAR CLEAR   Specific  Gravity, Urine RESULTS UNAVAILABLE DUE TO INTERFERING SUBSTANCE 1.005 - 1.030   pH 6.0 5.0 - 8.0   Glucose, UA NEGATIVE NEGATIVE mg/dL   Hgb urine dipstick SMALL (A) NEGATIVE   Bilirubin Urine NEGATIVE NEGATIVE   Ketones, ur 5 (A) NEGATIVE mg/dL   Protein, ur NEGATIVE NEGATIVE mg/dL   Nitrite NEGATIVE NEGATIVE   Leukocytes,Ua NEGATIVE NEGATIVE   RBC / HPF 0-5 0 - 5 RBC/hpf   WBC, UA 0-5 0 - 5 WBC/hpf   Bacteria, UA NONE SEEN NONE SEEN   Squamous Epithelial / HPF 0-5 0 - 5 /HPF    Comment: Performed at [facility], [address], [place], [place] [address]  Basic metabolic panel     Status: Abnormal   Collection Time: 12/16/22  5:05 AM  Result Value Ref Range   Sodium 132 (L) 135 - 145 mmol/L   Potassium 2.9 (L) 3.5 - 5.1 mmol/L   Chloride 97 (L) 98 - 111 mmol/L   CO2 29 22 - 32 mmol/L   Glucose, Bld 97 70 - 99 mg/dL    Comment: Glucose reference range applies only to samples taken after fasting for at least 8 hours.   BUN 8 6 - 20 mg/dL   Creatinine, Ser 4.25 0.44 - 1.00 mg/dL   Calcium 8.5 (L) 8.9 - 10.3 mg/dL   GFR, Estimated >95 >63 mL/min    Comment: (NOTE) Calculated using the CKD-EPI Creatinine Equation (2021)    Anion gap 6 5 - 15    Comment: Performed at [facility], [address], [place], [place] [address]  Magnesium     Status: None   Collection Time: 12/16/22  5:05 AM  Result Value Ref Range   Magnesium 1.9 1.7 - 2.4 mg/dL    Comment: Performed at [facility], [address] [place], [place] [address]  Phosphorus     Status: None   Collection Time: 12/16/22  5:05 AM  Result Value Ref Range   Phosphorus 3.0 2.5 - 4.6 mg/dL    Comment: Performed at [facility], [address] [place], [place] [address]  Hepatic function panel     Status: Abnormal   Collection Time: 12/16/22  5:05 AM  Result Value Ref Range   Total Protein 7.1 6.5 - 8.1 g/dL   Albumin 3.2 (L) 3.5 - 5.0 g/dL    AST 37 15 - 41 U/L   ALT 28 0 - 44 U/L   Alkaline Phosphatase 204 (H) 38 - 126 U/L   Total Bilirubin 0.5 <1.2 mg/dL   Bilirubin, Direct 0.1 0.0 - 0.2 mg/dL   Indirect Bilirubin 0.4 0.3 - 0.9 mg/dL    Comment: Performed at [facility], [address] [place], [place] [address]  CBC     Status: Abnormal   Collection Time: 12/16/22  5:05 AM  Result Value Ref Range   WBC 7.6 4.0 - 10.5 K/uL   RBC 3.83 (L) 3.87 - 5.11 MIL/uL   Hemoglobin 11.6 (L) 12.0 - 15.0 g/dL   HCT 73.2 (L) 20.2 - 54.2 %   MCV 93.5 80.0 - 100.0 fL   MCH 30.3 26.0 - 34.0 pg   MCHC 32.4 30.0 - 36.0 g/dL   RDW 70.6 23.7 - 62.8 %   Platelets 272 150 - 400 K/uL   nRBC 0.0 0.0 - 0.2 %    Comment: Performed at [facility], [address], [place], [place] [address]   *Note: Due to a large number of results and/or encounters for the requested time period, some results have not been displayed. A complete set of results can be found in Results Review.   CT Chest W Contrast  Result Date: 12/16/2022 CLINICAL DATA:  Occult malignancy. Low-grade fevers, chills, sweats, nausea and vomiting, abdominal and back pain. Right-sided neck adenopathy. Patient is currently being treated for metastatic colon cancer. EXAM: CT CHEST, ABDOMEN, AND PELVIS WITH CONTRAST TECHNIQUE: Multidetector CT imaging of the chest, abdomen and pelvis was performed following the standard protocol during bolus administration of intravenous contrast. RADIATION DOSE REDUCTION: This exam was performed according to the departmental dose-optimization program which includes automated exposure control, adjustment of the mA and/or kV according to patient size and/or use of iterative reconstruction technique. CONTRAST:  OMNIPAQUE IOHEXOL 300 MG/ML  SOLN COMPARISON:  CT chest 11/21/2022. CT chest abdomen and pelvis 10/15/2022 FINDINGS: CT CHEST FINDINGS Cardiovascular: Normal heart size. No pericardial effusions. Ascending aortic  aneurysm measuring 4 cm AP diameter. No change since prior study. No aortic dissection. Great vessel origins are patent. Mediastinum/Nodes: Esophagus is decompressed. Thyroid gland is unremarkable. No significant lymphadenopathy. Lungs/Pleura: Mild linear atelectasis in the lung bases. Bullous emphysematous changes in the apices. No focal pulmonary parenchymal lesions, consolidation, or airspace disease. No pleural effusions. No pneumothorax. Musculoskeletal: No chest wall mass or suspicious bone lesions identified. CT ABDOMEN PELVIS FINDINGS Hepatobiliary: A biliary stent is in place with residual intrahepatic bile duct dilatation. Bile duct dilatation is increased since prior studies. Pneumobilia consistent with stent placement. Focal hypodense lesion demonstrated in segment 3 of the liver measuring 1.6 cm diameter. This is not demonstrated in the prior study and could be metastatic.  Heterogeneous parenchymal enhancement to the liver possibly indicating vascular compromise. Increased density in the gallbladder likely due to vicarious excretion of contrast. Pancreas: Unremarkable. No pancreatic ductal dilatation or surrounding inflammatory changes. Spleen: Normal in size without focal abnormality. Adrenals/Urinary Tract: No adrenal gland nodules. Bilateral intrarenal stones. Largest on the left measures 1.2 cm diameter. No change since prior studies. No hydronephrosis or hydroureter. Bladder is normal. Stomach/Bowel: Stomach, small bowel, and colon are not abnormally distended. Wall thickening is again demonstrated in the upper rectum possibly correlating to neoplasm or radiation change. No proximal obstruction. Vascular/Lymphatic: Aortic atherosclerosis. No enlarged abdominal or pelvic lymph nodes. Reproductive: Uterus and bilateral adnexa are unremarkable. Other: Heterogeneous peritoneal and omental nodularity with conglomerate omental nodules measuring up to 2.7 x 1.3 cm in diameter. Omental nodularity seems  increased since prior study from 10/15/2022. This may represent progressive metastatic disease. No free air or free fluid in the abdomen. 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 No focal bone lesions. IMPRESSION: 1. Ascending aortic aneurysm measuring 4 cm diameter. Recommend annual imaging followup by CTA or MRA. This recommendation follows 2010 ACCF/AHA/AATS/ACR/ASA/SCA/SCAI/SIR/STS/SVM Guidelines for the Diagnosis and Management of Patients with Thoracic Aortic Disease. Circulation. 2010; 121: Z610-R604. Aortic aneurysm NOS (ICD10-I71.9) 2. No evidence of metastatic disease in the chest. Emphysematous changes in the lungs. 3. Biliary stent with residual bile duct dilatation, increasing since prior study. Possible developing nodule in the lateral segment left lobe of the liver may indicate progressive metastatic disease. Heterogeneous enhancement pattern of the liver parenchyma could indicate vascular compromise. 4. Increasing omental nodularity may indicate progressive metastatic disease. 5. Persistent wall thickening in the upper rectum possibly correlates to history of neoplasm or radiation change. 6. Aortic atherosclerosis. 7. 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 8. Bilateral nonobstructing intrarenal stones. Electronically Signed   By: Burman Nieves M.D.   On: 12/16/2022 00:43   CT ABDOMEN PELVIS W CONTRAST  Result Date: 12/16/2022 CLINICAL DATA:  Occult malignancy. Low-grade fevers, chills, sweats, nausea and vomiting, abdominal and back pain. Right-sided neck adenopathy. Patient is currently being treated for metastatic colon cancer. EXAM: CT CHEST, ABDOMEN, AND PELVIS WITH CONTRAST TECHNIQUE: Multidetector CT imaging of the chest, abdomen and pelvis was performed following the standard protocol during bolus administration of intravenous contrast. RADIATION DOSE REDUCTION: This exam was performed according to the departmental dose-optimization  program which includes automated exposure control, adjustment of the mA and/or kV according to patient size and/or use of iterative reconstruction technique. CONTRAST:  OMNIPAQUE IOHEXOL 300 MG/ML  SOLN COMPARISON:  CT chest 11/21/2022. CT chest abdomen and pelvis 10/15/2022 FINDINGS: CT CHEST FINDINGS Cardiovascular: Normal heart size. No pericardial effusions. Ascending aortic aneurysm measuring 4 cm AP diameter. No change since prior study. No aortic dissection. Great vessel origins are patent. Mediastinum/Nodes: Esophagus is decompressed. Thyroid gland is unremarkable. No significant lymphadenopathy. Lungs/Pleura: Mild linear atelectasis in the lung bases. Bullous emphysematous changes in the apices. No focal pulmonary parenchymal lesions, consolidation, or airspace disease. No pleural effusions. No pneumothorax. Musculoskeletal: No chest wall mass or suspicious bone lesions identified. CT ABDOMEN PELVIS FINDINGS Hepatobiliary: A biliary stent is in place with residual intrahepatic bile duct dilatation. Bile duct dilatation is increased since prior studies. Pneumobilia consistent with stent placement. Focal hypodense lesion demonstrated in segment 3 of the liver measuring 1.6 cm diameter. This is not demonstrated in the prior study and could be metastatic. Heterogeneous parenchymal enhancement to the liver possibly indicating vascular compromise. Increased density in the gallbladder likely due to vicarious  excretion of contrast. Pancreas: Unremarkable. No pancreatic ductal dilatation or surrounding inflammatory changes. Spleen: Normal in size without focal abnormality. Adrenals/Urinary Tract: No adrenal gland nodules. Bilateral intrarenal stones. Largest on the left measures 1.2 cm diameter. No change since prior studies. No hydronephrosis or hydroureter. Bladder is normal. Stomach/Bowel: Stomach, small bowel, and colon are not abnormally distended. Wall thickening is again demonstrated in the upper rectum  possibly correlating to neoplasm or radiation change. No proximal obstruction. Vascular/Lymphatic: Aortic atherosclerosis. No enlarged abdominal or pelvic lymph nodes. Reproductive: Uterus and bilateral adnexa are unremarkable. Other: Heterogeneous peritoneal and omental nodularity with conglomerate omental nodules measuring up to 2.7 x 1.3 cm in diameter. Omental nodularity seems increased since prior study from 10/15/2022. This may represent progressive metastatic disease. No free air or free fluid in the abdomen. 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 No focal bone lesions. IMPRESSION: 1. Ascending aortic aneurysm measuring 4 cm diameter. Recommend annual imaging followup by CTA or MRA. This recommendation follows 2010 ACCF/AHA/AATS/ACR/ASA/SCA/SCAI/SIR/STS/SVM Guidelines for the Diagnosis and Management of Patients with Thoracic Aortic Disease. Circulation. 2010; 121: R604-V409. Aortic aneurysm NOS (ICD10-I71.9) 2. No evidence of metastatic disease in the chest. Emphysematous changes in the lungs. 3. Biliary stent with residual bile duct dilatation, increasing since prior study. Possible developing nodule in the lateral segment left lobe of the liver may indicate progressive metastatic disease. Heterogeneous enhancement pattern of the liver parenchyma could indicate vascular compromise. 4. Increasing omental nodularity may indicate progressive metastatic disease. 5. Persistent wall thickening in the upper rectum possibly correlates to history of neoplasm or radiation change. 6. Aortic atherosclerosis. 7. 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 8. Bilateral nonobstructing intrarenal stones. Electronically Signed   By: Burman Nieves M.D.   On: 12/16/2022 00:43   CT Soft Tissue Neck W Contrast  Result Date: 12/15/2022 CLINICAL DATA:  Right facial swelling EXAM: CT NECK WITH CONTRAST TECHNIQUE: Multidetector CT imaging of the neck was performed using the  standard protocol following the bolus administration of intravenous contrast. RADIATION DOSE REDUCTION: This exam was performed according to the departmental dose-optimization program which includes automated exposure control, adjustment of the mA and/or kV according to patient size and/or use of iterative reconstruction technique. CONTRAST:  OMNIPAQUE IOHEXOL 300 MG/ML  SOLN COMPARISON:  None Available. FINDINGS: Pharynx and larynx: Normal. No mass or swelling. Salivary glands: There is a 2 mm calculus of the distal right submandibular duct. Mild ductal dilatation. There is mild inflammatory change adjacent to the right submandibular gland. There is mild intraglandular ductal dilatation of the left submandibular gland. The parotid glands are normal. Thyroid: Normal Lymph nodes: Reactive subcentimeter right submandibular nodes. Vascular: Negative. Limited intracranial: Negative. Visualized orbits: Negative. Mastoids and visualized paranasal sinuses: Clear. Skeleton: Bulky anterior cervical osteophytes. Upper chest: Right apical bulla. Other: None IMPRESSION: 1. A 2 mm calculus of the distal right submandibular duct with mild ductal dilatation and mild inflammatory change adjacent to the right submandibular gland. 2. Mild intraglandular ductal dilatation of the left submandibular gland. Electronically Signed   By: Deatra Robinson M.D.   On: 12/15/2022 23:34   DG Chest Port 1 View  Result Date: 12/15/2022 CLINICAL DATA:  Cough, chills and generalized body aches. EXAM: PORTABLE CHEST 1 VIEW COMPARISON:  July 24, 2022 FINDINGS: The heart size and mediastinal contours are within normal limits. Both lungs are clear. The visualized skeletal structures are unremarkable. IMPRESSION: No active disease. Electronically Signed   By: Aram Candela M.D.   On: 12/15/2022 20:18  IR PATIENT EVAL TECH 0-60 MINS  Result Date: 12/15/2022 April [name]     12/15/2022  4:25 PM  Patient ID: April [name], female    DOB: 1975/10/30, 47 y.o.   MRN: [id number] Pt presented to IR dept today as OP for PICC placement to assist with chemotherapy for met colon cancer. On presentation pt appeared weak and also c/o low grade fevers/chills/sweats, N/V , abd/back pain and rt sided neck adenopathy. Above was discussed with ordering team/Lisa Thomas,NP with oncology and they advised to send pt to ED for further evaluation. PICC placement canceled for now.      Pending Labs Unresulted Labs (From admission, onward)     Start     Ordered   12/23/22 0500  Creatinine, serum  (enoxaparin (LOVENOX)    CrCl >/= 30 ml/min)  Weekly,   R     Comments: while on enoxaparin therapy    12/16/22 0435   12/16/22 0500  Basic metabolic panel  Daily,   R      12/16/22 0435   12/16/22 0500  CBC  Daily,   R      12/16/22 0435            Vitals/Pain Today's Vitals   12/16/22 0544 12/16/22 0600 12/16/22 0730 12/16/22 0938  BP:  (!) 146/108 (!) 158/122 (!) 154/103  Pulse:  80 80 86  Resp:  13 13 18   Temp: 98.8 F (37.1 C)   98.9 F (37.2 C)  TempSrc: Oral   Oral  SpO2:  98% 100% 100%  PainSc:        Isolation Precautions No active isolations  Medications Medications  morphine (MS CONTIN) 12 hr tablet 15 mg (has no administration in time range)  amLODipine (NORVASC) tablet 10 mg (has no administration in time range)  metoprolol tartrate (LOPRESSOR) tablet 50 mg (has no administration in time range)  dicyclomine (BENTYL) tablet 20 mg (has no administration in time range)  senna-docusate (Senokot-S) tablet 1 tablet (has no administration in time range)  polyethylene glycol (MIRALAX / GLYCOLAX) packet 17 g (has no administration in time range)  mometasone-formoterol (DULERA) 200-5 MCG/ACT inhaler 2 puff (has no administration in time range)  umeclidinium bromide (INCRUSE ELLIPTA) 62.5 MCG/ACT 1 puff (has no administration in time range)  enoxaparin (LOVENOX) injection 40 mg (has no administration in time range)  sodium  chloride flush (NS) 0.9 % injection 3 mL (has no administration in time range)  acetaminophen (TYLENOL) tablet 650 mg (has no administration in time range)    Or  acetaminophen (TYLENOL) suppository 650 mg (has no administration in time range)  oxyCODONE (Oxy IR/ROXICODONE) immediate release tablet 5 mg (has no administration in time range)  HYDROmorphone (DILAUDID) injection 0.5-1 mg (has no administration in time range)  ondansetron (ZOFRAN) tablet 4 mg (has no administration in time range)    Or  ondansetron (ZOFRAN) injection 4 mg (has no administration in time range)  Ampicillin-Sulbactam (UNASYN) 3 g in sodium chloride 0.9 % 100 mL IVPB (3 g Intravenous New Bag/Given 12/16/22 0518)  potassium chloride SA (KLOR-CON M) CR tablet 20 mEq (has no administration in time range)  lactated ringers infusion ( Intravenous New Bag/Given 12/16/22 0517)  albuterol (PROVENTIL) (2.5 MG/3ML) 0.083% nebulizer solution 2.5 mg (has no administration in time range)  morphine (PF) 4 MG/ML injection 4 mg (4 mg Intravenous Given 12/15/22 1750)  metoCLOPramide (REGLAN) injection 5 mg (5 mg Intravenous Given 12/15/22 1748)  potassium chloride SA (KLOR-CON M) CR tablet  40 mEq (40 mEq Oral Given 12/15/22 1822)  potassium chloride 10 mEq in 100 mL IVPB (0 mEq Intravenous Stopped 12/15/22 2329)  iohexol (OMNIPAQUE) 300 MG/ML solution 100 mL (100 mLs Intravenous Contrast Given 12/15/22 2131)  HYDROmorphone (DILAUDID) injection 1 mg (1 mg Intravenous Given 12/15/22 2206)  metoCLOPramide (REGLAN) injection 5 mg (5 mg Intravenous Given 12/15/22 2206)  nystatin (MYCOSTATIN) 100000 UNIT/ML suspension 500,000 Units (500,000 Units Mouth/Throat Given 12/15/22 2328)  dextrose 5 % solution (0 mLs Intravenous Paused 12/16/22 0518)    Mobility walks with person assist     Focused Assessments Dehydration; Thrush   R Recommendations: See Admitting Provider Note  Report given to:   Additional Notes: .

## 2022-12-16 NOTE — ED Notes (Signed)
242 bladder scan

## 2022-12-16 NOTE — Progress Notes (Signed)
IP PROGRESS NOTE  Subjective:   April [name] is well-known to me with a history of metastatic colon cancer, currently maintained off of specific therapy.  She is scheduled to begin a salvage chemotherapy regimen this week.  She presented for PICC placement yesterday afternoon and reported fever, chills, and sweats for several days.  She complained of pain at the right submandibular area with noticeable swelling.  She reports the right submandibular discomfort has improved.  She continues to have abdominal pain.  She feels as though she is not emptying her bladder.  Objective: Vital signs in last 24 hours: Blood pressure (!) 92/51, pulse 80, temperature 98.8 F (37.1 C), temperature source Oral, resp. rate 18, last menstrual period 01/07/2006, SpO2 99%.  Intake/Output from previous day: 12/02 0701 - 12/03 0700 In: 100 [IV Piggyback:100] Out: -   Physical Exam:  HEENT: No thrush, slight fullness in the right compared to the left submandibular gland Lungs: Clear bilaterally Cardiac: Regular rate and rhythm Abdomen: Soft, tender in the mid upper abdomen, no hepatosplenomegaly Extremities: No leg edema   Portacath/PICC-without erythema  Lab Results: Recent Labs    12/15/22 1719 12/16/22 0505  WBC 6.8 7.6  HGB 12.8 11.6*  HCT 37.4 35.8*  PLT 298 272    BMET Recent Labs    12/15/22 1719 12/16/22 0505  NA 132* 132*  K 2.8* 2.9*  CL 93* 97*  CO2 34* 29  GLUCOSE 87 97  BUN 10 8  CREATININE 0.53 0.52  CALCIUM 8.8* 8.5*    Lab Results  Component Value Date   CEA1 <0.6 03/23/2022   CEA 5.90 (H) 12/03/2022   CAN199 213 (H) 03/23/2022    Studies/Results: [place] EKG SITE RITE  Result Date: 12/16/2022 If Site Rite image not attached, placement could not be confirmed due to current cardiac rhythm.  CT Chest W Contrast  Result Date: 12/16/2022 CLINICAL DATA:  Occult malignancy. Low-grade fevers, chills, sweats, nausea and vomiting, abdominal and back pain.  Right-sided neck adenopathy. Patient is currently being treated for metastatic colon cancer. EXAM: CT CHEST, ABDOMEN, AND PELVIS WITH CONTRAST TECHNIQUE: Multidetector CT imaging of the chest, abdomen and pelvis was performed following the standard protocol during bolus administration of intravenous contrast. RADIATION DOSE REDUCTION: This exam was performed according to the departmental dose-optimization program which includes automated exposure control, adjustment of the mA and/or kV according to patient size and/or use of iterative reconstruction technique. CONTRAST:  OMNIPAQUE IOHEXOL 300 MG/ML  SOLN COMPARISON:  CT chest 11/21/2022. CT chest abdomen and pelvis 10/15/2022 FINDINGS: CT CHEST FINDINGS Cardiovascular: Normal heart size. No pericardial effusions. Ascending aortic aneurysm measuring 4 cm AP diameter. No change since prior study. No aortic dissection. Great vessel origins are patent. Mediastinum/Nodes: Esophagus is decompressed. Thyroid gland is unremarkable. No significant lymphadenopathy. Lungs/Pleura: Mild linear atelectasis in the lung bases. Bullous emphysematous changes in the apices. No focal pulmonary parenchymal lesions, consolidation, or airspace disease. No pleural effusions. No pneumothorax. Musculoskeletal: No chest wall mass or suspicious bone lesions identified. CT ABDOMEN PELVIS FINDINGS Hepatobiliary: A biliary stent is in place with residual intrahepatic bile duct dilatation. Bile duct dilatation is increased since prior studies. Pneumobilia consistent with stent placement. Focal hypodense lesion demonstrated in segment 3 of the liver measuring 1.6 cm diameter. This is not demonstrated in the prior study and could be metastatic. Heterogeneous parenchymal enhancement to the liver possibly indicating vascular compromise. Increased density in the gallbladder likely due to vicarious excretion of contrast. Pancreas: Unremarkable. No pancreatic ductal  dilatation or surrounding  inflammatory changes. Spleen: Normal in size without focal abnormality. Adrenals/Urinary Tract: No adrenal gland nodules. Bilateral intrarenal stones. Largest on the left measures 1.2 cm diameter. No change since prior studies. No hydronephrosis or hydroureter. Bladder is normal. Stomach/Bowel: Stomach, small bowel, and colon are not abnormally distended. Wall thickening is again demonstrated in the upper rectum possibly correlating to neoplasm or radiation change. No proximal obstruction. Vascular/Lymphatic: Aortic atherosclerosis. No enlarged abdominal or pelvic lymph nodes. Reproductive: Uterus and bilateral adnexa are unremarkable. Other: Heterogeneous peritoneal and omental nodularity with conglomerate omental nodules measuring up to 2.7 x 1.3 cm in diameter. Omental nodularity seems increased since prior study from 10/15/2022. This may represent progressive metastatic disease. No free air or free fluid in the abdomen. 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 No focal bone lesions. IMPRESSION: 1. Ascending aortic aneurysm measuring 4 cm diameter. Recommend annual imaging followup by CTA or MRA. This recommendation follows 2010 ACCF/AHA/AATS/ACR/ASA/SCA/SCAI/SIR/STS/SVM Guidelines for the Diagnosis and Management of Patients with Thoracic Aortic Disease. Circulation. 2010; 121: W098-J191. Aortic aneurysm NOS (ICD10-I71.9) 2. No evidence of metastatic disease in the chest. Emphysematous changes in the lungs. 3. Biliary stent with residual bile duct dilatation, increasing since prior study. Possible developing nodule in the lateral segment left lobe of the liver may indicate progressive metastatic disease. Heterogeneous enhancement pattern of the liver parenchyma could indicate vascular compromise. 4. Increasing omental nodularity may indicate progressive metastatic disease. 5. Persistent wall thickening in the upper rectum possibly correlates to history of neoplasm or radiation  change. 6. Aortic atherosclerosis. 7. 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 8. Bilateral nonobstructing intrarenal stones. Electronically Signed   By: Burman Nieves M.D.   On: 12/16/2022 00:43   CT ABDOMEN PELVIS W CONTRAST  Result Date: 12/16/2022 CLINICAL DATA:  Occult malignancy. Low-grade fevers, chills, sweats, nausea and vomiting, abdominal and back pain. Right-sided neck adenopathy. Patient is currently being treated for metastatic colon cancer. EXAM: CT CHEST, ABDOMEN, AND PELVIS WITH CONTRAST TECHNIQUE: Multidetector CT imaging of the chest, abdomen and pelvis was performed following the standard protocol during bolus administration of intravenous contrast. RADIATION DOSE REDUCTION: This exam was performed according to the departmental dose-optimization program which includes automated exposure control, adjustment of the mA and/or kV according to patient size and/or use of iterative reconstruction technique. CONTRAST:  OMNIPAQUE IOHEXOL 300 MG/ML  SOLN COMPARISON:  CT chest 11/21/2022. CT chest abdomen and pelvis 10/15/2022 FINDINGS: CT CHEST FINDINGS Cardiovascular: Normal heart size. No pericardial effusions. Ascending aortic aneurysm measuring 4 cm AP diameter. No change since prior study. No aortic dissection. Great vessel origins are patent. Mediastinum/Nodes: Esophagus is decompressed. Thyroid gland is unremarkable. No significant lymphadenopathy. Lungs/Pleura: Mild linear atelectasis in the lung bases. Bullous emphysematous changes in the apices. No focal pulmonary parenchymal lesions, consolidation, or airspace disease. No pleural effusions. No pneumothorax. Musculoskeletal: No chest wall mass or suspicious bone lesions identified. CT ABDOMEN PELVIS FINDINGS Hepatobiliary: A biliary stent is in place with residual intrahepatic bile duct dilatation. Bile duct dilatation is increased since prior studies. Pneumobilia consistent with stent placement. Focal hypodense lesion  demonstrated in segment 3 of the liver measuring 1.6 cm diameter. This is not demonstrated in the prior study and could be metastatic. Heterogeneous parenchymal enhancement to the liver possibly indicating vascular compromise. Increased density in the gallbladder likely due to vicarious excretion of contrast. Pancreas: Unremarkable. No pancreatic ductal dilatation or surrounding inflammatory changes. Spleen: Normal in size without focal abnormality. Adrenals/Urinary Tract: No adrenal gland nodules.  Bilateral intrarenal stones. Largest on the left measures 1.2 cm diameter. No change since prior studies. No hydronephrosis or hydroureter. Bladder is normal. Stomach/Bowel: Stomach, small bowel, and colon are not abnormally distended. Wall thickening is again demonstrated in the upper rectum possibly correlating to neoplasm or radiation change. No proximal obstruction. Vascular/Lymphatic: Aortic atherosclerosis. No enlarged abdominal or pelvic lymph nodes. Reproductive: Uterus and bilateral adnexa are unremarkable. Other: Heterogeneous peritoneal and omental nodularity with conglomerate omental nodules measuring up to 2.7 x 1.3 cm in diameter. Omental nodularity seems increased since prior study from 10/15/2022. This may represent progressive metastatic disease. No free air or free fluid in the abdomen. 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 No focal bone lesions. IMPRESSION: 1. Ascending aortic aneurysm measuring 4 cm diameter. Recommend annual imaging followup by CTA or MRA. This recommendation follows 2010 ACCF/AHA/AATS/ACR/ASA/SCA/SCAI/SIR/STS/SVM Guidelines for the Diagnosis and Management of Patients with Thoracic Aortic Disease. Circulation. 2010; 121: O756-E332. Aortic aneurysm NOS (ICD10-I71.9) 2. No evidence of metastatic disease in the chest. Emphysematous changes in the lungs. 3. Biliary stent with residual bile duct dilatation, increasing since prior study. Possible  developing nodule in the lateral segment left lobe of the liver may indicate progressive metastatic disease. Heterogeneous enhancement pattern of the liver parenchyma could indicate vascular compromise. 4. Increasing omental nodularity may indicate progressive metastatic disease. 5. Persistent wall thickening in the upper rectum possibly correlates to history of neoplasm or radiation change. 6. Aortic atherosclerosis. 7. 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 8. Bilateral nonobstructing intrarenal stones. Electronically Signed   By: Burman Nieves M.D.   On: 12/16/2022 00:43   CT Soft Tissue Neck W Contrast  Result Date: 12/15/2022 CLINICAL DATA:  Right facial swelling EXAM: CT NECK WITH CONTRAST TECHNIQUE: Multidetector CT imaging of the neck was performed using the standard protocol following the bolus administration of intravenous contrast. RADIATION DOSE REDUCTION: This exam was performed according to the departmental dose-optimization program which includes automated exposure control, adjustment of the mA and/or kV according to patient size and/or use of iterative reconstruction technique. CONTRAST:  OMNIPAQUE IOHEXOL 300 MG/ML  SOLN COMPARISON:  None Available. FINDINGS: Pharynx and larynx: Normal. No mass or swelling. Salivary glands: There is a 2 mm calculus of the distal right submandibular duct. Mild ductal dilatation. There is mild inflammatory change adjacent to the right submandibular gland. There is mild intraglandular ductal dilatation of the left submandibular gland. The parotid glands are normal. Thyroid: Normal Lymph nodes: Reactive subcentimeter right submandibular nodes. Vascular: Negative. Limited intracranial: Negative. Visualized orbits: Negative. Mastoids and visualized paranasal sinuses: Clear. Skeleton: Bulky anterior cervical osteophytes. Upper chest: Right apical bulla. Other: None IMPRESSION: 1. A 2 mm calculus of the distal right submandibular duct with mild  ductal dilatation and mild inflammatory change adjacent to the right submandibular gland. 2. Mild intraglandular ductal dilatation of the left submandibular gland. Electronically Signed   By: Deatra Robinson M.D.   On: 12/15/2022 23:34   DG Chest Port 1 View  Result Date: 12/15/2022 CLINICAL DATA:  Cough, chills and generalized body aches. EXAM: PORTABLE CHEST 1 VIEW COMPARISON:  July 24, 2022 FINDINGS: The heart size and mediastinal contours are within normal limits. Both lungs are clear. The visualized skeletal structures are unremarkable. IMPRESSION: No active disease. Electronically Signed   By: Aram Candela M.D.   On: 12/15/2022 20:18   IR PATIENT EVAL TECH 0-60 MINS  Result Date: 12/15/2022 Arby Barrette     12/15/2022  4:25 PM  Patient ID: April [name]  Felipe, female   DOB: 02/14/1975, 47 y.o.   MRN: [id number] Pt presented to IR dept today as OP for PICC placement to assist with chemotherapy for met colon cancer. On presentation pt appeared weak and also c/o low grade fevers/chills/sweats, N/V , abd/back pain and rt sided neck adenopathy. Above was discussed with ordering team/Lisa Thomas,NP with oncology and they advised to send pt to ED for further evaluation. PICC placement canceled for now.      Medications: I have reviewed the patient's current medications.  Assessment/Plan:  Sigmoid colon cancer, stage IV (pT4a,pN2b,M1c) Colonoscopy 03/24/2019-3 rectal polyps-hyperplastic polyps, distal colon biopsy-at least intramucosal adenocarcinoma, completely obstructing mass in the distal sigmoid colon, could not be traversed, intact mismatch repair protein expression 03/24/2019-CEA 56.1 03/29/2019 CT abdomen/pelvis-circumferential thickening involving the entire mid and distal sigmoid colon to the level of the rectosigmoid junction, solid/cystic mass in the left ovary, bilateral nephrolithiasis Robotic assisted low anterior resection, mesenteric lymphadenectomy, bilateral  salpingo-oophorectomy 04/08/2019 Pathology ([facility] review of outside pathology) metastatic adenocarcinoma involving the peritoneum overlying the round ligament, serosa of the urinary bladder, serosa of the right ureter a sacral area, pelvic peritoneum, and left ovary.  Omental biopsy with focal mucin pools with no carcinoma cells identified, right hemidiaphragm biopsy involved by metastatic adenocarcinoma, invasive adenocarcinoma the sigmoid colon, moderately differentiated, T4a, perineural and vascular invasion present, 7/22 lymph nodes, multiple tumor deposits, resection margins negative Negative for PD-L1, low probability of MSI-high, HER-2 negative, negative for BRAF, NRAS and KRAS alterations CTs 05/19/2019-no evidence of metastatic disease, findings suspicious for colitis of the transverse and ascending colon, small amount of ascites in the cul-de-sac, bilateral renal calculi Cycle 1 FOLFIRI 05/24/2019, bevacizumab added with cycle 2 Cycle 4 FOLFIRI/bevacizumab 07/05/2019 Cycle 5 FOLFIRI 07/27/2019, bevacizumab held secondary to hypertension Cycle 6 FOLFIRI 08/10/1999, bevacizumab held secondary to hypertension CTs 08/30/2019-no evidence of recurrent disease, new subsolid right lower lobe nodule felt to be inflammatory, emphysema Cycle 7 FOLFIRI 09/26/2019, bevacizumab remains on hold secondary to hypertension Cycle 8 FOLFIRI 10/17/2019, bevacizumab held secondary to hypertension, Udenyca added for neutropenia Cycle 9 FOLFIRI 11/07/2019, bevacizumab held secondary to hypertension, Udenyca Cycle 10 FOLFIRI 11/28/2019, bevacizumab held, Udenyca Cycle 11 FOLFIRI 12/26/2019, bevacizumab held, Udenyca CTs 01/25/2020-no evidence of recurrent disease, resolution of right lower lobe nodule Maintenance Xeloda beginning 02/06/2020 CTs 04/25/2020- no evidence of metastatic disease, multiple bilateral renal calculi without hydronephrosis Maintenance Xeloda continued CT abdomen/pelvis without contrast  08/10/2020-bilateral staghorn renal calculi, no evidence of metastatic disease; addendum 08/23/2020-small but increasing omental nodules. Cycle 1 FOLFIRI/panitumumab 09/19/2020 Cycle 2 FOLFIRI/Panitumumab 10/03/2020 Cycle 3 FOLFIRI/Panitumumab 10/17/2020 Cycle 4 FOLFIRI/panitumumab 10/31/2020 Cycle 5 FOLFIRI/Panitumumab 11/14/2020 CTs 11/26/2020-stable omental metastases compared to 10/22/2020, mildly decreased from 08/10/2020.  Left lower quadrant tiny paracolic gutter implant slightly decreased from CT 08/10/2020.  No new or progressive metastatic disease in the abdomen or pelvis. Cycle 6 FOLFIRI/Panitumumab 11/28/2020, irinotecan dose reduced, treatment schedule adjusted to every 3 weeks going forward Cycle 7 FOLFIRI/panitumumab 12/24/2020 Cycle 8 FOLFIRI/Panitumumab 01/16/2021--treatment held due to hypertension in the infusion area. Cycle 8 FOLFIRI/panitumumab 01/21/2021 Cycle 9 FOLFIRI/panitumumab 02/11/2021 Cycle 10 FOLFIRI/Panitumumab 03/04/2021 Cycle 11 FOLFIRI/Panitumumab 03/25/2021 CT abdomen/pelvis 04/10/2021-stable right omental and left posterior paracolic gutter nodules Cycle 12 FOLFIRI/panitumumab 04/15/2021 Cycle 13 FOLFIRI/panitumumab 05/06/2021 Cycle 14 FOLFIRI/Panitumumab 05/27/2021 Cycle 15 FOLFIRI/Panitumumab 06/17/2021 CT abdomen/pelvis 07/05/2021-slight enlargement of a dominant right omental implant, no new implants Patient requested a treatment break CT abdomen/pelvis 10/04/2021-mild increase in size of multiple omental soft tissue nodules, 4 mm calculus in the distal left ureter adjacent  to the ureteral stent Cycle 1 Lonsurf 10/14/2021 10/28/2021 Avastin every 2 weeks Cycle 2 Lonsurf 11/11/2021 Cycle 3 Lonsurf 12/09/2021 Cycle 4 Lonsurf 01/06/2022 Avastin held 01/20/2022 due to proteinuria, 24-hour urine 43 mg CT/pelvis 01/30/2022-possible new peritoneal implant at the transverse colon, no significant change in other omental/peritoneal implants, stable chronic rectal wall thickening, status  post removal of double-J left ureteral stent with no evidence of hydronephrosis or ureteral calculus, nonobstructing bilateral renal calculi Cycle 5 Lonsurf 02/05/2022 or 02/06/2022 Avastin every 2 weeks Cycle 6 Lonsurf 03/03/2022 CTs 04/05/2022-increase in size and number of peritoneal implants compared to January 2024 central liver mass narrowing the anterior branch of the right portal vein, decreased biliary duct dilation, biliary stents in place, stable right cardiophrenic lymph nodes, separate pigtail stent in the lumen of the proximal duodenum Cycle 1 FOLFOX 04/14/2022 CT abdomen/pelvis 04/16/2022-stent within the third portion of the duodenum, stable peritoneal nodules, indwelling biliary stents with mild left biliary duct dilation Cycle 2 FOLFOX 04/28/2022, Emend and prophylactic dexamethasone added Cycle 3 FOLFOX 05/12/2022, Aloxi, Emend, prophylactic dexamethasone, Compazine and lorazepam as needed Chemotherapy held 05/25/2020 due to severe hypertension, evaluated in the emergency department Cycle 4 FOLFOX 06/03/2022 Cycle 5 FOLFOX 06/16/2022, oxaliplatin dose reduced and 5-FU bolus eliminated due to neutropenia CT 06/25/2022-unchanged soft tissue at the hepatic hilum, unchanged, bile duct stent, decreased size of peritoneal and omental nodules and diminished volume of likely fluid in the lower left pelvis Cycle 6 FOLFOX 06/30/2022 Cycle 7 FOLFOX 07/21/2022 CT 07/24/2022 (in the emergency department with nausea, headache, abdominal pain)-no acute pathology.  No significant interval change in omental nodularity. Cycle 8 FOLFOX 08/04/2022, pruritus on the palms at the end of the oxaliplatin infusion, Pepcid and Benadryl given, symptoms resolved, infusion not resumed Cycle 9 FOLFOX 08/18/2022-Pepcid and Benadryl added to premedications, Oxaliplatin diluted in a larger volume and infusion time increased Cycle 10 FOLFOX 09/08/2022 Cycle 11 FOLFOX 09/22/2022 10/06/2022 patient declined treatment CTs 10/15/2022-similar  soft tissue fullness in the hepatic hilum with biliary stents in place, decrease in omental/peritoneal metastases Maintenance capecitabine 10/26/2022 CT abdomen/pelvis 11/19/2022-mild increase in tumor studding at the left paracolic gutter and omentum, circumferential wall thickening of the rectum, biliary stents in place with minimal increase in intrahepatic biliary dilation, nonobstructive renal calculi Colonoscopy 11/23/2022-anastomosis at 15 cm from the anal verge-patent, 2 cm polypoid tissue at the anastomosis biopsied, invasive adenocarcinoma moderately differentiated, preserved expression of major MMR proteins CTs 12/15/2022: Possible developing nodule in the lateral left liver, increased omental nodularity, persistent wall thickening in the upper rectum   Hypertension G4 P3, twins Kidney stones-bilateral staghorn renal calculi on CT 08/10/2020 Infected Port-A-Cath 09/12/2019-placed on Augmentin, referred for Port-A-Cath removal; Port-A-Cath removed 09/14/2019; PICC line placed 09/14/2019; culture staph aureus.  Course of Septra completed. Neutropenia secondary to chemotherapy-Udenyca added with cycle 8 FOLFIRI Right nephrostomy tube 09/12/2020; stent placed 10/09/2020, percutaneous nephrostolithotomy treatment of right-sided kidney stones 10/09/2020, malpositioned right ureter stent replacement 10/23/2020, right ureter stent removed 10/29/2020 Percutaneous left nephrostolithotomy 10/01/2021-no renal stone identified, left ureter stent left in place Cystoscopy 10/14/2021-stent removed 8.  Admission 03/21/2022 with new onset jaundice, abdominal pain, nausea MRI abdomen 03/22/2022-central liver mass with obstruction at the confluence of the intrahepatic ducts and proximal common hepatic duct with severe Intermatic biliary ductal dilatation, progressive peritoneal carcinomatosis ERCP 03/24/2022-severe biliary stricture in the hepatic duct affecting the left and right hepatic duct, common hepatic duct, and  bifurcation, malignant appearing, temporary plastic pancreatic stent, left and right hepatic duct stents were placed 9.  Admission  04/16/2022 with nausea/vomiting and abdominal pain, felt to be acute toxicity related to chemotherapy, discharged home 04/18/2022 10.  Admission 11/19/2022 with nausea/vomiting and abdominal pain-improved 11.  Admission 12/15/2022 with fever and right submandibular swelling/tenderness CT neck 12/15/2022: 2 mm distal right submandibular duct calculus with mild inflammatory change adjacent to the right submandibular gland  April [name] has metastatic colon cancer.  She was scheduled to begin salvage therapy with irinotecan/panitumumab this week.  She presented yesterday with a report of fever/chills and "sweats ".  She reported tenderness and enlargement of the right submandibular gland.  She was found to have a calculus and inflammation at the right submandibular gland.  It is possible her presentation was related to acute inflammation/infection at the right submandibular duct.  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  She has been treated with multiple systemic therapy regimens.  She has been referred to Dr. Rod Mae at [facility] to consider clinical trial and standard treatment options.  We will decide on initiating irinotecan/panitumumab here versus waiting on the consult with Dr. Rod Mae next week.  Recommendations: Antibiotics, management of the right submandibular infection per the medical service Proceed with PICC placement for chemotherapy Outpatient follow-up will be scheduled at the Cancer center   LOS: 0 days   Thornton Papas, MD   12/16/2022, 1:30 PM

## 2022-12-16 NOTE — H&P (Signed)
History and Physical    April [name] ZOX:[id number] DOB: 1975-04-09 DOA: 12/15/2022  PCP: Tollie Eth, NP   Patient coming from: Home   Chief Complaint: Chills, sweats, N/V, abdominal and back pain, right submandibular pain and swelling, sore throat  HPI: April [name] is a 47 y.o. female with medical history significant for hypertension, emphysema, stage IV colon cancer, and chronic cancer related pain who presents for evaluation of nausea, vomiting, fever, chills, sweats, abdominal and back pain, and right submandibular pain and swelling.  The patient woke due to nausea 4 nights ago and has had persistent nausea with nonbloody vomiting since.  She has also been experiencing increase in her chronic abdominal pain, tender lump under the mandible on the right side, subjective fevers, chills, and sweats since then.   She was scheduled to have a PICC placed yesterday but appeared weak when she arrived for the appointment, complained of fever, chills, sweats, nausea, vomiting, abdominal pain, and the right neck lump.  PICC placement was canceled and she was referred to the ED.  ED Course: Upon arrival to the ED, patient is found to be afebrile and saturating well on room air with normal heart rate and stable blood pressure.  Labs are most notable for sodium 132, potassium 2.8, alkaline phosphatase 212, normal renal function, normal WBC, and negative respiratory virus panel.  CT chest/abdomen/pelvis most notable for emphysematous changes, 4 cm ascending aortic aneurysm, biliary stent with residual biliary dilation, increased omental nodularity, and persistent wall thickening in the upper rectum.  CT neck soft tissues demonstrates 2 mm calculus in distal right submandibular duct with ductal dilation and inflammatory changes.   Patient was treated in the ED with oral and IV potassium, oral nystatin, Reglan x 2, Dilaudid, morphine, and D5W infusion.  Review of Systems:  All other systems  reviewed and apart from HPI, are negative.  Past Medical History:  Diagnosis Date   Benign essential hypertension 10/06/2017   Last Assessment & Plan:  Formatting of this note might be different from the original. Patient's blood pressure noted to be elevated at today's visit 188/98.  Patient will be provided with antihypertensives in the treatment center if needed in order to meet her Avastin parameters.  Patient recommended to follow up with her primary care physician regarding medication management.  Patient is not cur   Cancer [facility])    Colon cancer (HCC)    Hypertension    Renal calculi    bilateral    Past Surgical History:  Procedure Laterality Date   BILIARY BRUSHING  03/24/2022   Procedure: BILIARY BRUSHING;  Surgeon: Meridee Score Netty Starring., MD;  Location: [facility] ENDOSCOPY;  Service: Gastroenterology;;   BILIARY DILATION  03/24/2022   Procedure: BILIARY DILATION;  Surgeon: Lemar Lofty., MD;  Location: [facility] ENDOSCOPY;  Service: Gastroenterology;;   BILIARY STENT PLACEMENT  03/24/2022   Procedure: BILIARY STENT PLACEMENT;  Surgeon: Lemar Lofty., MD;  Location: [facility] ENDOSCOPY;  Service: Gastroenterology;;   BIOPSY  03/24/2022   Procedure: BIOPSY;  Surgeon: Lemar Lofty., MD;  Location: [facility] ENDOSCOPY;  Service: Gastroenterology;;   BIOPSY  11/23/2022   Procedure: BIOPSY;  Surgeon: Hilarie Fredrickson, MD;  Location: [facility] ENDOSCOPY;  Service: Gastroenterology;;   CESAREAN SECTION     x2   COLONOSCOPY N/A 11/23/2022   Procedure: COLONOSCOPY;  Surgeon: Hilarie Fredrickson, MD;  Location: [facility] ENDOSCOPY;  Service: Gastroenterology;  Laterality: N/A;   CYSTOSCOPY WITH RETROGRADE PYELOGRAM, URETEROSCOPY AND STENT PLACEMENT Right 10/22/2020  Procedure: CYSTOSCOPY WITH RETROGRADE PYELOGRAM, URETEROSCOPY AND STENT PLACEMENT;  Surgeon: Noel Christmas, MD;  Location: [facility] ORS;  Service: Urology;  Laterality: Right;   ERCP N/A 03/24/2022   Procedure: ENDOSCOPIC RETROGRADE  CHOLANGIOPANCREATOGRAPHY (ERCP);  Surgeon: Lemar Lofty., MD;  Location: [facility] ENDOSCOPY;  Service: Gastroenterology;  Laterality: N/A;   IR PATIENT EVAL TECH 0-60 MINS  12/15/2022   IR RADIOLOGIST EVAL & MGMT  09/16/2019   IR RADIOLOGIST EVAL & MGMT  09/23/2019   IR RADIOLOGIST EVAL & MGMT  09/20/2019   IR RADIOLOGIST EVAL & MGMT  09/30/2019   IR REMOVAL TUN ACCESS W/ PORT W/O FL MOD SED  09/14/2019   IR VENO/EXT/UNI RIGHT  04/11/2022   PANCREATIC STENT PLACEMENT  03/24/2022   Procedure: PANCREATIC STENT PLACEMENT;  Surgeon: Lemar Lofty., MD;  Location: [facility] ENDOSCOPY;  Service: Gastroenterology;;   REMOVAL OF STONES  03/24/2022   Procedure: REMOVAL OF STONES;  Surgeon: Lemar Lofty., MD;  Location: [facility] ENDOSCOPY;  Service: Gastroenterology;;   Dennison Mascot  03/24/2022   Procedure: Dennison Mascot;  Surgeon: Lemar Lofty., MD;  Location: [facility] ENDOSCOPY;  Service: Gastroenterology;;   URETERAL STENT PLACEMENT      Social History:   reports that she has quit smoking. Her smoking use included cigarettes. She has never used smokeless tobacco. She reports that she does not currently use alcohol. She reports that she does not currently use drugs after having used the following drugs: Hydrocodone.  Allergies  Allergen Reactions   Lisinopril Swelling and Other (See Comments)    Lip swelling and Angioedema   Irinotecan Other (See Comments)    Stomach cramping Patient given 0.5 mL atropine IV and was able to continue infusion.  See Progress note on 10/03/20.    Leucovorin Calcium Itching    Patient reported itchy palms towards end of transfusion, see progress note 08/04/22.    Oxaliplatin Itching    Patient reported itchy palms towards end of transfusion, see progress note on 08/04/22.    Family History  Problem Relation Age of Onset   Hypertension Mother    Diabetes Mellitus II Father    Stroke Father    Ulcers Sister    Colon cancer Neg Hx    Esophageal cancer Neg  Hx    Inflammatory bowel disease Neg Hx    Liver disease Neg Hx    Pancreatic cancer Neg Hx    Rectal cancer Neg Hx    Stomach cancer Neg Hx      Prior to Admission medications   Medication Sig Start Date End Date Taking? Authorizing Provider  acetaminophen (ACETAMINOPHEN 8 HOUR) 650 MG CR tablet Take 650 mg by mouth every 8 (eight) hours as needed for pain.   Yes [provider]  amLODipine (NORVASC) 10 MG tablet Take 1 tablet (10 mg total) by mouth daily. 11/24/22  Yes Rodolph Bong, MD  chlorthalidone (HYGROTON) 25 MG tablet Take 1 tablet (25 mg total) by mouth daily. 11/24/22  Yes Rodolph Bong, MD  HYDROcodone-acetaminophen [facility]) 10-325 MG tablet Take 1-2 tablets by mouth every 6 (six) hours as needed. 12/08/22  Yes Rana Snare, NP  metoprolol tartrate (LOPRESSOR) 50 MG tablet Take 1 tablet (50 mg total) by mouth 2 (two) times daily. 11/24/22  Yes Rodolph Bong, MD  morphine (MS CONTIN) 15 MG 12 hr tablet Take 1 tablet (15 mg total) by mouth every 12 (twelve) hours. 12/03/22  Yes Rana Snare, NP  polyethylene glycol powder [facility])  17 GM/SCOOP powder Take 17 g by mouth 2 (two) times daily. 11/24/22  Yes Rodolph Bong, MD  potassium chloride (MICRO-K) 10 MEQ CR capsule Take 2 capsules (20 mEq total) by mouth 2 (two) times daily. 11/24/22  Yes Rodolph Bong, MD  prochlorperazine (COMPAZINE) 10 MG tablet Take 1 tablet (10 mg total) by mouth every 6 (six) hours as needed for nausea or vomiting. 04/28/22  Yes Rana Snare, NP  senna-docusate (SENOKOT-S) 8.6-50 MG tablet Take 1 tablet by mouth 2 (two) times daily. 11/24/22  Yes Rodolph Bong, MD  umeclidinium bromide (INCRUSE ELLIPTA) 62.5 MCG/ACT AEPB Inhale 1 puff into the lungs daily. 11/25/22  Yes Rodolph Bong, MD  dexamethasone (DECADRON) 4 MG tablet Take 1 tablet by mouth twice a day for 3 days beginning day 2 of chemo. Patient not taking: Reported on 11/11/2022 08/04/22   Rana Snare, NP  diclofenac Sodium (VOLTAREN) 1 % GEL Research Patient: Apply 0.5 grams (1 fingertip) to each hand and each foot twice daily for up to 12 weeks Patient not taking: Reported on 11/11/2022 10/27/22   Ladene Artist, MD  dicyclomine (BENTYL) 20 MG tablet Take 1 tablet (20 mg total) by mouth 3 (three) times daily as needed for spasms (rectal/AB pain). 11/24/22   Rodolph Bong, MD  mometasone-formoterol (DULERA) 200-5 MCG/ACT AERO Inhale 2 puffs into the lungs 2 (two) times daily. 11/24/22   Rodolph Bong, MD    Physical Exam: Vitals:   12/15/22 2330 12/16/22 0000 12/16/22 0121 12/16/22 0300  BP: (!) 145/122 (!) 134/101  (!) 147/119  Pulse: 92 84  88  Resp: 12 12  16   Temp:   99.1 F (37.3 C)   TempSrc:      SpO2: 99% 97%  100%    Constitutional: NAD, calm  Eyes: PERTLA, lids and conjunctivae normal ENMT: Mucous membranes are moist. Pus expressed from right submandibular gland orifice.  Posterior pharynx clear of any exudate or lesions.   Neck: supple. Firm and tender submandibular nodule on right.  Respiratory: no wheezing, no crackles. No accessory muscle use.  Cardiovascular: S1 & S2 heard, regular rate and rhythm. No extremity edema.   Abdomen: Soft, no guarding. Bowel sounds active.  Musculoskeletal: no clubbing / cyanosis. No joint deformity upper and lower extremities.   Skin: no significant rashes, lesions, ulcers. Warm, dry, well-perfused. Neurologic: CN 2-12 grossly intact. Moving all extremities. Alert and oriented.  Psychiatric: Pleasant. Cooperative.    Labs and Imaging on Admission: I have personally reviewed following labs and imaging studies  CBC: Recent Labs  Lab 12/15/22 1719  WBC 6.8  NEUTROABS 3.4  HGB 12.8  HCT 37.4  MCV 91.4  PLT 298   Basic Metabolic Panel: Recent Labs  Lab 12/15/22 1719  NA 132*  K 2.8*  CL 93*  CO2 34*  GLUCOSE 87  BUN 10  CREATININE 0.53  CALCIUM 8.8*   GFR: Estimated Creatinine Clearance: 74.3 mL/min  (by C-G formula based on SCr of 0.53 mg/dL). Liver Function Tests: Recent Labs  Lab 12/15/22 1719  AST 32  ALT 28  ALKPHOS 212*  BILITOT 0.4  PROT 7.8  ALBUMIN 3.6   Recent Labs  Lab 12/15/22 1728  LIPASE 30   No results for input(s): "AMMONIA" in the last 168 hours. Coagulation Profile: No results for input(s): "INR", "PROTIME" in the last 168 hours. Cardiac Enzymes: No results for input(s): "CKTOTAL", "CKMB", "CKMBINDEX", "TROPONINI" in the last 168 hours. BNP (  last 3 results) No results for input(s): "PROBNP" in the last 8760 hours. HbA1C: No results for input(s): "HGBA1C" in the last 72 hours. CBG: No results for input(s): "GLUCAP" in the last 168 hours. Lipid Profile: No results for input(s): "CHOL", "HDL", "LDLCALC", "TRIG", "CHOLHDL", "LDLDIRECT" in the last 72 hours. Thyroid Function Tests: No results for input(s): "TSH", "T4TOTAL", "FREET4", "T3FREE", "THYROIDAB" in the last 72 hours. Anemia Panel: No results for input(s): "VITAMINB12", "FOLATE", "FERRITIN", "TIBC", "IRON", "RETICCTPCT" in the last 72 hours. Urine analysis:    Component Value Date/Time   COLORURINE YELLOW 12/16/2022 0115   APPEARANCEUR CLEAR 12/16/2022 0115   LABSPEC RESULTS UNAVAILABLE DUE TO INTERFERING SUBSTANCE 12/16/2022 0115   PHURINE 6.0 12/16/2022 0115   GLUCOSEU NEGATIVE 12/16/2022 0115   HGBUR SMALL (A) 12/16/2022 0115   BILIRUBINUR NEGATIVE 12/16/2022 0115   KETONESUR 5 (A) 12/16/2022 0115   PROTEINUR NEGATIVE 12/16/2022 0115   NITRITE NEGATIVE 12/16/2022 0115   LEUKOCYTESUR NEGATIVE 12/16/2022 0115   Sepsis Labs: @LABRCNTIP (procalcitonin:4,lacticidven:4) ) Recent Results (from the past 240 hour(s))  Resp panel by RT-PCR (RSV, Flu A&B, Covid) Anterior Nasal Swab     Status: None   Collection Time: 12/15/22  5:19 PM   Specimen: Anterior Nasal Swab  Result Value Ref Range Status   SARS Coronavirus 2 by RT PCR NEGATIVE NEGATIVE Final    Comment: (NOTE) SARS-CoV-2 target  nucleic acids are NOT DETECTED.  The SARS-CoV-2 RNA is generally detectable in upper respiratory specimens during the acute phase of infection. The lowest concentration of SARS-CoV-2 viral copies this assay can detect is 138 copies/mL. A negative result does not preclude SARS-Cov-2 infection and should not be used as the sole basis for treatment or other patient management decisions. A negative result may occur with  improper specimen collection/handling, submission of specimen other than nasopharyngeal swab, presence of viral mutation(s) within the areas targeted by this assay, and inadequate number of viral copies(<138 copies/mL). A negative result must be combined with clinical observations, patient history, and epidemiological information. The expected result is Negative.  Fact Sheet for Patients:  BloggerCourse.com  Fact Sheet for Healthcare Providers:  SeriousBroker.it  This test is no t yet approved or cleared by the [place] FDA and  has been authorized for detection and/or diagnosis of SARS-CoV-2 by FDA under an Emergency Use Authorization (EUA). This EUA will remain  in effect (meaning this test can be used) for the duration of the COVID-19 declaration under Section 564(b)(1) of the Act, 21 U.S.C.section 360bbb-3(b)(1), unless the authorization is terminated  or revoked sooner.       Influenza A by PCR NEGATIVE NEGATIVE Final   Influenza B by PCR NEGATIVE NEGATIVE Final    Comment: (NOTE) The Xpert Xpress SARS-CoV-2/FLU/RSV plus assay is intended as an aid in the diagnosis of influenza from Nasopharyngeal swab specimens and should not be used as a sole basis for treatment. Nasal washings and aspirates are unacceptable for Xpert Xpress SARS-CoV-2/FLU/RSV testing.  Fact Sheet for Patients: BloggerCourse.com  Fact Sheet for Healthcare  Providers: SeriousBroker.it  This test is not yet approved or cleared by the [place] FDA and has been authorized for detection and/or diagnosis of SARS-CoV-2 by FDA under an Emergency Use Authorization (EUA). This EUA will remain in effect (meaning this test can be used) for the duration of the COVID-19 declaration under Section 564(b)(1) of the Act, 21 U.S.C. section 360bbb-3(b)(1), unless the authorization is terminated or revoked.     Resp Syncytial Virus by PCR NEGATIVE NEGATIVE  Final    Comment: (NOTE) Fact Sheet for Patients: BloggerCourse.com  Fact Sheet for Healthcare Providers: SeriousBroker.it  This test is not yet approved or cleared by the [place] FDA and has been authorized for detection and/or diagnosis of SARS-CoV-2 by FDA under an Emergency Use Authorization (EUA). This EUA will remain in effect (meaning this test can be used) for the duration of the COVID-19 declaration under Section 564(b)(1) of the Act, 21 U.S.C. section 360bbb-3(b)(1), unless the authorization is terminated or revoked.  Performed at [facility] [address], [place], [place] [address]   Group A Strep by PCR     Status: None   Collection Time: 12/15/22  5:19 PM   Specimen: Anterior Nasal Swab; Sterile Swab  Result Value Ref Range Status   Group A Strep by PCR NOT DETECTED NOT DETECTED Final    Comment: Performed at [facility], [address] [place], [place] [address]     Radiological Exams on Admission: CT Chest W Contrast  Result Date: 12/16/2022 CLINICAL DATA:  Occult malignancy. Low-grade fevers, chills, sweats, nausea and vomiting, abdominal and back pain. Right-sided neck adenopathy. Patient is currently being treated for metastatic colon cancer. EXAM: CT CHEST, ABDOMEN, AND PELVIS WITH CONTRAST TECHNIQUE: Multidetector CT imaging of the chest, abdomen  and pelvis was performed following the standard protocol during bolus administration of intravenous contrast. RADIATION DOSE REDUCTION: This exam was performed according to the departmental dose-optimization program which includes automated exposure control, adjustment of the mA and/or kV according to patient size and/or use of iterative reconstruction technique. CONTRAST:  OMNIPAQUE IOHEXOL 300 MG/ML  SOLN COMPARISON:  CT chest 11/21/2022. CT chest abdomen and pelvis 10/15/2022 FINDINGS: CT CHEST FINDINGS Cardiovascular: Normal heart size. No pericardial effusions. Ascending aortic aneurysm measuring 4 cm AP diameter. No change since prior study. No aortic dissection. Great vessel origins are patent. Mediastinum/Nodes: Esophagus is decompressed. Thyroid gland is unremarkable. No significant lymphadenopathy. Lungs/Pleura: Mild linear atelectasis in the lung bases. Bullous emphysematous changes in the apices. No focal pulmonary parenchymal lesions, consolidation, or airspace disease. No pleural effusions. No pneumothorax. Musculoskeletal: No chest wall mass or suspicious bone lesions identified. CT ABDOMEN PELVIS FINDINGS Hepatobiliary: A biliary stent is in place with residual intrahepatic bile duct dilatation. Bile duct dilatation is increased since prior studies. Pneumobilia consistent with stent placement. Focal hypodense lesion demonstrated in segment 3 of the liver measuring 1.6 cm diameter. This is not demonstrated in the prior study and could be metastatic. Heterogeneous parenchymal enhancement to the liver possibly indicating vascular compromise. Increased density in the gallbladder likely due to vicarious excretion of contrast. Pancreas: Unremarkable. No pancreatic ductal dilatation or surrounding inflammatory changes. Spleen: Normal in size without focal abnormality. Adrenals/Urinary Tract: No adrenal gland nodules. Bilateral intrarenal stones. Largest on the left measures 1.2 cm diameter. No change  since prior studies. No hydronephrosis or hydroureter. Bladder is normal. Stomach/Bowel: Stomach, small bowel, and colon are not abnormally distended. Wall thickening is again demonstrated in the upper rectum possibly correlating to neoplasm or radiation change. No proximal obstruction. Vascular/Lymphatic: Aortic atherosclerosis. No enlarged abdominal or pelvic lymph nodes. Reproductive: Uterus and bilateral adnexa are unremarkable. Other: Heterogeneous peritoneal and omental nodularity with conglomerate omental nodules measuring up to 2.7 x 1.3 cm in diameter. Omental nodularity seems increased since prior study from 10/15/2022. This may represent progressive metastatic disease. No free air or free fluid in the abdomen. 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 No focal bone lesions. IMPRESSION:  1. Ascending aortic aneurysm measuring 4 cm diameter. Recommend annual imaging followup by CTA or MRA. This recommendation follows 2010 ACCF/AHA/AATS/ACR/ASA/SCA/SCAI/SIR/STS/SVM Guidelines for the Diagnosis and Management of Patients with Thoracic Aortic Disease. Circulation. 2010; 121: G956-O130. Aortic aneurysm NOS (ICD10-I71.9) 2. No evidence of metastatic disease in the chest. Emphysematous changes in the lungs. 3. Biliary stent with residual bile duct dilatation, increasing since prior study. Possible developing nodule in the lateral segment left lobe of the liver may indicate progressive metastatic disease. Heterogeneous enhancement pattern of the liver parenchyma could indicate vascular compromise. 4. Increasing omental nodularity may indicate progressive metastatic disease. 5. Persistent wall thickening in the upper rectum possibly correlates to history of neoplasm or radiation change. 6. Aortic atherosclerosis. 7. 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 8. Bilateral nonobstructing intrarenal stones. Electronically Signed   By: Burman Nieves M.D.   On: 12/16/2022  00:43   CT ABDOMEN PELVIS W CONTRAST  Result Date: 12/16/2022 CLINICAL DATA:  Occult malignancy. Low-grade fevers, chills, sweats, nausea and vomiting, abdominal and back pain. Right-sided neck adenopathy. Patient is currently being treated for metastatic colon cancer. EXAM: CT CHEST, ABDOMEN, AND PELVIS WITH CONTRAST TECHNIQUE: Multidetector CT imaging of the chest, abdomen and pelvis was performed following the standard protocol during bolus administration of intravenous contrast. RADIATION DOSE REDUCTION: This exam was performed according to the departmental dose-optimization program which includes automated exposure control, adjustment of the mA and/or kV according to patient size and/or use of iterative reconstruction technique. CONTRAST:  OMNIPAQUE IOHEXOL 300 MG/ML  SOLN COMPARISON:  CT chest 11/21/2022. CT chest abdomen and pelvis 10/15/2022 FINDINGS: CT CHEST FINDINGS Cardiovascular: Normal heart size. No pericardial effusions. Ascending aortic aneurysm measuring 4 cm AP diameter. No change since prior study. No aortic dissection. Great vessel origins are patent. Mediastinum/Nodes: Esophagus is decompressed. Thyroid gland is unremarkable. No significant lymphadenopathy. Lungs/Pleura: Mild linear atelectasis in the lung bases. Bullous emphysematous changes in the apices. No focal pulmonary parenchymal lesions, consolidation, or airspace disease. No pleural effusions. No pneumothorax. Musculoskeletal: No chest wall mass or suspicious bone lesions identified. CT ABDOMEN PELVIS FINDINGS Hepatobiliary: A biliary stent is in place with residual intrahepatic bile duct dilatation. Bile duct dilatation is increased since prior studies. Pneumobilia consistent with stent placement. Focal hypodense lesion demonstrated in segment 3 of the liver measuring 1.6 cm diameter. This is not demonstrated in the prior study and could be metastatic. Heterogeneous parenchymal enhancement to the liver possibly indicating  vascular compromise. Increased density in the gallbladder likely due to vicarious excretion of contrast. Pancreas: Unremarkable. No pancreatic ductal dilatation or surrounding inflammatory changes. Spleen: Normal in size without focal abnormality. Adrenals/Urinary Tract: No adrenal gland nodules. Bilateral intrarenal stones. Largest on the left measures 1.2 cm diameter. No change since prior studies. No hydronephrosis or hydroureter. Bladder is normal. Stomach/Bowel: Stomach, small bowel, and colon are not abnormally distended. Wall thickening is again demonstrated in the upper rectum possibly correlating to neoplasm or radiation change. No proximal obstruction. Vascular/Lymphatic: Aortic atherosclerosis. No enlarged abdominal or pelvic lymph nodes. Reproductive: Uterus and bilateral adnexa are unremarkable. Other: Heterogeneous peritoneal and omental nodularity with conglomerate omental nodules measuring up to 2.7 x 1.3 cm in diameter. Omental nodularity seems increased since prior study from 10/15/2022. This may represent progressive metastatic disease. No free air or free fluid in the abdomen. 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 No focal bone lesions. IMPRESSION: 1. Ascending aortic aneurysm measuring 4 cm diameter. Recommend annual imaging followup by CTA or MRA. This recommendation follows  2010 ACCF/AHA/AATS/ACR/ASA/SCA/SCAI/SIR/STS/SVM Guidelines for the Diagnosis and Management of Patients with Thoracic Aortic Disease. Circulation. 2010; 121: W119-J478. Aortic aneurysm NOS (ICD10-I71.9) 2. No evidence of metastatic disease in the chest. Emphysematous changes in the lungs. 3. Biliary stent with residual bile duct dilatation, increasing since prior study. Possible developing nodule in the lateral segment left lobe of the liver may indicate progressive metastatic disease. Heterogeneous enhancement pattern of the liver parenchyma could indicate vascular compromise. 4.  Increasing omental nodularity may indicate progressive metastatic disease. 5. Persistent wall thickening in the upper rectum possibly correlates to history of neoplasm or radiation change. 6. Aortic atherosclerosis. 7. 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 8. Bilateral nonobstructing intrarenal stones. Electronically Signed   By: Burman Nieves M.D.   On: 12/16/2022 00:43   CT Soft Tissue Neck W Contrast  Result Date: 12/15/2022 CLINICAL DATA:  Right facial swelling EXAM: CT NECK WITH CONTRAST TECHNIQUE: Multidetector CT imaging of the neck was performed using the standard protocol following the bolus administration of intravenous contrast. RADIATION DOSE REDUCTION: This exam was performed according to the departmental dose-optimization program which includes automated exposure control, adjustment of the mA and/or kV according to patient size and/or use of iterative reconstruction technique. CONTRAST:  OMNIPAQUE IOHEXOL 300 MG/ML  SOLN COMPARISON:  None Available. FINDINGS: Pharynx and larynx: Normal. No mass or swelling. Salivary glands: There is a 2 mm calculus of the distal right submandibular duct. Mild ductal dilatation. There is mild inflammatory change adjacent to the right submandibular gland. There is mild intraglandular ductal dilatation of the left submandibular gland. The parotid glands are normal. Thyroid: Normal Lymph nodes: Reactive subcentimeter right submandibular nodes. Vascular: Negative. Limited intracranial: Negative. Visualized orbits: Negative. Mastoids and visualized paranasal sinuses: Clear. Skeleton: Bulky anterior cervical osteophytes. Upper chest: Right apical bulla. Other: None IMPRESSION: 1. A 2 mm calculus of the distal right submandibular duct with mild ductal dilatation and mild inflammatory change adjacent to the right submandibular gland. 2. Mild intraglandular ductal dilatation of the left submandibular gland. Electronically Signed   By: Deatra Robinson  M.D.   On: 12/15/2022 23:34   DG Chest Port 1 View  Result Date: 12/15/2022 CLINICAL DATA:  Cough, chills and generalized body aches. EXAM: PORTABLE CHEST 1 VIEW COMPARISON:  July 24, 2022 FINDINGS: The heart size and mediastinal contours are within normal limits. Both lungs are clear. The visualized skeletal structures are unremarkable. IMPRESSION: No active disease. Electronically Signed   By: Aram Candela M.D.   On: 12/15/2022 20:18   IR PATIENT EVAL TECH 0-60 MINS  Result Date: 12/15/2022 Arby Barrette     12/15/2022  4:25 PM  Patient ID: [name], female   DOB: [birth year], 28 y.o.   MRN: [id number] Pt presented to IR dept today as OP for PICC placement to assist with chemotherapy for met colon cancer. On presentation pt appeared weak and also c/o low grade fevers/chills/sweats, N/V , abd/back pain and rt sided neck adenopathy. Above was discussed with ordering team/Lisa Thomas,NP with oncology and they advised to send pt to ED for further evaluation. PICC placement canceled for now.      EKG: Independently reviewed. Sinus rhythm, LVH.   Assessment/Plan   1. N/V; dehydration  - N/V has been controlled in ED and she wants to try liquids now  - Continue IVF hydration and supportive care, replace electrolytes, trial clears and advance diet as tolerated    2. Acute sialoadenitis of submandibular gland; salivary gland calculus  - Started on  Unasyn, provided sour candies, and encouraged to use warm compress and massage    3. Hypokalemia  - Serum potassium 2.8 in setting of N/V and recent poor intake  - Continue replacement, repeat chem panel in am   4. Rectal cancer with metastases  - She was planned to start irinotecan/Panitumumab today under the care of Dr. Truett Perna      5. Malignant biliary obstruction s/p stenting  - Biliary stent in place with residual biliary dilation on CT  - Transaminases and bilirubin are normal  - She is due for stent exchange and will  follow-up with  GI   6. Hypertension  - Norvasc and amlodipine    7. COPD  - Not in exacerbation   - Continue ICS-LABA, LAMA, and as-needed albuterol   8. Ascending aortic aneurysm  - Noted incidentally on CT in ED  - Discussed with patient, outpatient follow-up recommended    DVT prophylaxis: Lovenox  Code Status: Full  Level of Care: Level of care: Telemetry Family Communication: None present  Disposition Plan:  Patient is from: Home  Anticipated d/c is to: Home  Anticipated d/c date is: 12/4 or 12/18/22  Patient currently: Pending electrolyte repletion, tolerance of adequate oral intake, transition to oral medications  Consults called: None  Admission status: Observation     Briscoe Deutscher, MD [facility]  12/16/2022, 4:36 AM

## 2022-12-16 NOTE — Progress Notes (Signed)
Peripherally Inserted Central Catheter Placement  The IV Nurse has discussed with the patient and/or persons authorized to consent for the patient, the purpose of this procedure and the potential benefits and risks involved with this procedure.  The benefits include less needle sticks, lab draws from the catheter, and the patient may be discharged home with the catheter. Risks include, but not limited to, infection, bleeding, blood clot (thrombus formation), and puncture of an artery; nerve damage and irregular heartbeat and possibility to perform a PICC exchange if needed/ordered by physician.  Alternatives to this procedure were also discussed.  Bard Power PICC patient education guide, fact sheet on infection prevention and patient information card has been provided to patient /or left at bedside.    PICC Placement Documentation  PICC Double Lumen 12/16/22 Left Brachial 41 cm 1 cm (Active)  Indication for Insertion or Continuance of Line Vasoactive infusions 12/16/22 1833  Exposed Catheter (cm) 1 cm 12/16/22 1833  Site Assessment Clean, Dry, Intact 12/16/22 1833  Lumen #1 Status Blood return noted;Flushed;Saline locked 12/16/22 1833  Lumen #2 Status Flushed;Blood return noted;Saline locked 12/16/22 1833  Dressing Type Transparent 12/16/22 1833  Dressing Status Antimicrobial disc in place 12/16/22 1833  Dressing Intervention New dressing 12/16/22 1833  Dressing Change Due 12/23/22 12/16/22 1833       Audrie Gallus 12/16/2022, 6:34 PM

## 2022-12-16 NOTE — Progress Notes (Signed)
DISCONTINUE ON PATHWAY REGIMEN - Colorectal     A cycle is every 14 days:     Oxaliplatin      Leucovorin      Fluorouracil      Fluorouracil   **Always confirm dose/schedule in your pharmacy ordering system**  REASON: Disease Progression PRIOR TREATMENT: [id number]: [id number] q14 Days TREATMENT RESPONSE: Progressive Disease (PD)  START OFF PATHWAY REGIMEN - Colorectal   [id number]:Irinotecan 180 mg/m2 IV D1 + Panitumumab 6 mg/kg IV D1 q14 Days:   A cycle is every 14 days:     Panitumumab      Irinotecan   **Always confirm dose/schedule in your pharmacy ordering system**  Patient Characteristics: Distant Metastases, Nonsurgical Candidate, KRAS/NRAS Wild-Type (BRAF V600 Wild-Type/Unknown), Standard Cytotoxic Therapy, Fourth Line and Beyond Standard Cytotoxic Therapy Tumor Location: Colon Therapeutic Status: Distant Metastases Microsatellite/Mismatch Repair Status: MSS/pMMR BRAF Mutation Status: Wild-Type (no mutation) KRAS/NRAS Mutation Status: Wild-Type (no mutation) Preferred Therapy Approach: Standard Cytotoxic Therapy Standard Cytotoxic Line of Therapy: Fourth Line and Beyond Standard Cytotoxic Therapy Intent of Therapy: Non-Curative / Palliative Intent, Discussed with Patient

## 2022-12-16 NOTE — Progress Notes (Signed)
PROGRESS NOTE    April [name]  ZOX:[id number] DOB: 07/09/1975 DOA: 12/15/2022 PCP: Tollie Eth, NP   Brief Narrative: 47 year old with past medical history significant for hypertension, emphysema, stage IV colon cancer, chronic cancer-related pain presents with nausea vomiting, fever chills abdominal pain and back pain and submandibular pain and swelling.  She developed nausea vomiting 4 days prior to admission.  She experiencing increased chronic abdominal pain.  She was scheduled to have a PICC line placed day  prior to admission but she noticed to be weak and complaining of fevers and chills.  PICC line was canceled.  Was referred for to the ED for further evaluation.   Assessment & Plan:   Principal Problem:   Dehydration Active Problems:   Hypertension   Hypokalemia   Rectal cancer (HCC)   Biliary obstruction   Nausea and vomiting   Emphysema/COPD (HCC)   Sialoadenitis of submandibular gland   Ascending aortic aneurysm (HCC)  1-Nausea vomiting, dehydration: -Continue with IV fluids, IV Protonix, as needed Zofran -No bowel movement, start bowel regimen -CT abdomen and pelvis increasing omental nodularity may indicate progressive metastatic disease.   Acute sialoadenitis of submandibular gland, salivary gland calculus: -Continue with Unasyn -provided sour candies, and encouraged to use warm compress and massage      Hypokalemia: Replete IV   Rectal cancer with metastasis disease Appreciate Dr. Truett Perna assistance. Plan to proceed with PICC line placement  Malignant biliary obstruction status post stenting Biliary stent in place with residual biliary dilation on CT  - Transaminases and bilirubin are normal  -She  follows with [facility] she is due for stent exchange  Hypertension: -Continue with Norvasc and metoprolol.  Monitor BP, hold if remain soft   COPD: Continue with Incruse Ellipta, Dulera  Ascending aortic aneurysmal: Needs follow-up as an  outpatient    Estimated body mass index is 23.96 kg/m as calculated from the following:   Height as of 12/03/22: 5\' 4"  (1.626 m).   Weight as of 12/03/22: 63.3 kg.   DVT prophylaxis: Lovenox Code Status: Full code Family Communication: Care discussed with patient Disposition Plan:  Status is: Observation The patient remains OBS appropriate and will d/c before 2 midnights.    Consultants:  Dr Truett Perna  Procedures:  none  Antimicrobials:    Subjective: She is alert, report chronic abdominal pain. No further vomiting. Submandibular swelling improving   Objective: Vitals:   12/16/22 0500 12/16/22 0544 12/16/22 0600 12/16/22 0730  BP: (!) 139/103  (!) 146/108 (!) 158/122  Pulse: 85  80 80  Resp: 11  13 13   Temp:  98.8 F (37.1 C)    TempSrc:  Oral    SpO2: 99%  98% 100%    Intake/Output Summary (Last 24 hours) at 12/16/2022 0827 Last data filed at 12/15/2022 2329 Gross per 24 hour  Intake 100 ml  Output --  Net 100 ml   There were no vitals filed for this visit.  Examination:  General exam: Appears calm and comfortable  Respiratory system: Clear to auscultation. Respiratory effort normal. Cardiovascular system: S1 & S2 heard, RRR. No JVD, murmurs, rubs, gallops or clicks. No pedal edema. Gastrointestinal system: Abdomen is nondistended, soft and nontender. No organomegaly or masses felt. Normal bowel sounds heard. Central nervous system: Alert and oriented.  Extremities: Symmetric 5 x 5 power.    Data Reviewed: I have personally reviewed following labs and imaging studies  CBC: Recent Labs  Lab 12/15/22 1719 12/16/22 0505  WBC 6.8 7.6  NEUTROABS 3.4  --   HGB 12.8 11.6*  HCT 37.4 35.8*  MCV 91.4 93.5  PLT 298 272   Basic Metabolic Panel: Recent Labs  Lab 12/15/22 1719 12/16/22 0505  NA 132* 132*  K 2.8* 2.9*  CL 93* 97*  CO2 34* 29  GLUCOSE 87 97  BUN 10 8  CREATININE 0.53 0.52  CALCIUM 8.8* 8.5*  MG  --  1.9  PHOS  --  3.0    GFR: Estimated Creatinine Clearance: 74.3 mL/min (by C-G formula based on SCr of 0.52 mg/dL). Liver Function Tests: Recent Labs  Lab 12/15/22 1719 12/16/22 0505  AST 32 37  ALT 28 28  ALKPHOS 212* 204*  BILITOT 0.4 0.5  PROT 7.8 7.1  ALBUMIN 3.6 3.2*   Recent Labs  Lab 12/15/22 1728  LIPASE 30   No results for input(s): "AMMONIA" in the last 168 hours. Coagulation Profile: No results for input(s): "INR", "PROTIME" in the last 168 hours. Cardiac Enzymes: No results for input(s): "CKTOTAL", "CKMB", "CKMBINDEX", "TROPONINI" in the last 168 hours. BNP (last 3 results) No results for input(s): "PROBNP" in the last 8760 hours. HbA1C: No results for input(s): "HGBA1C" in the last 72 hours. CBG: No results for input(s): "GLUCAP" in the last 168 hours. Lipid Profile: No results for input(s): "CHOL", "HDL", "LDLCALC", "TRIG", "CHOLHDL", "LDLDIRECT" in the last 72 hours. Thyroid Function Tests: No results for input(s): "TSH", "T4TOTAL", "FREET4", "T3FREE", "THYROIDAB" in the last 72 hours. Anemia Panel: No results for input(s): "VITAMINB12", "FOLATE", "FERRITIN", "TIBC", "IRON", "RETICCTPCT" in the last 72 hours. Sepsis Labs: No results for input(s): "PROCALCITON", "LATICACIDVEN" in the last 168 hours.  Recent Results (from the past 240 hour(s))  Resp panel by RT-PCR (RSV, Flu A&B, Covid) Anterior Nasal Swab     Status: None   Collection Time: 12/15/22  5:19 PM   Specimen: Anterior Nasal Swab  Result Value Ref Range Status   SARS Coronavirus 2 by RT PCR NEGATIVE NEGATIVE Final    Comment: (NOTE) SARS-CoV-2 target nucleic acids are NOT DETECTED.  The SARS-CoV-2 RNA is generally detectable in upper respiratory specimens during the acute phase of infection. The lowest concentration of SARS-CoV-2 viral copies this assay can detect is 138 copies/mL. A negative result does not preclude SARS-Cov-2 infection and should not be used as the sole basis for treatment or other patient  management decisions. A negative result may occur with  improper specimen collection/handling, submission of specimen other than nasopharyngeal swab, presence of viral mutation(s) within the areas targeted by this assay, and inadequate number of viral copies(<138 copies/mL). A negative result must be combined with clinical observations, patient history, and epidemiological information. The expected result is Negative.  Fact Sheet for Patients:  BloggerCourse.com  Fact Sheet for Healthcare Providers:  SeriousBroker.it  This test is no t yet approved or cleared by the [place] FDA and  has been authorized for detection and/or diagnosis of SARS-CoV-2 by FDA under an Emergency Use Authorization (EUA). This EUA will remain  in effect (meaning this test can be used) for the duration of the COVID-19 declaration under Section 564(b)(1) of the Act, 21 U.S.C.section 360bbb-3(b)(1), unless the authorization is terminated  or revoked sooner.       Influenza A by PCR NEGATIVE NEGATIVE Final   Influenza B by PCR NEGATIVE NEGATIVE Final    Comment: (NOTE) The Xpert Xpress SARS-CoV-2/FLU/RSV plus assay is intended as an aid in the diagnosis of influenza from Nasopharyngeal swab specimens and should not be used  as a sole basis for treatment. Nasal washings and aspirates are unacceptable for Xpert Xpress SARS-CoV-2/FLU/RSV testing.  Fact Sheet for Patients: BloggerCourse.com  Fact Sheet for Healthcare Providers: SeriousBroker.it  This test is not yet approved or cleared by the [place] FDA and has been authorized for detection and/or diagnosis of SARS-CoV-2 by FDA under an Emergency Use Authorization (EUA). This EUA will remain in effect (meaning this test can be used) for the duration of the COVID-19 declaration under Section 564(b)(1) of the Act, 21 U.S.C. section 360bbb-3(b)(1),  unless the authorization is terminated or revoked.     Resp Syncytial Virus by PCR NEGATIVE NEGATIVE Final    Comment: (NOTE) Fact Sheet for Patients: BloggerCourse.com  Fact Sheet for Healthcare Providers: SeriousBroker.it  This test is not yet approved or cleared by the [place] FDA and has been authorized for detection and/or diagnosis of SARS-CoV-2 by FDA under an Emergency Use Authorization (EUA). This EUA will remain in effect (meaning this test can be used) for the duration of the COVID-19 declaration under Section 564(b)(1) of the Act, 21 U.S.C. section 360bbb-3(b)(1), unless the authorization is terminated or revoked.  Performed at [facility], [address] [place], [place] [address]   Group A Strep by PCR     Status: None   Collection Time: 12/15/22  5:19 PM   Specimen: Anterior Nasal Swab; Sterile Swab  Result Value Ref Range Status   Group A Strep by PCR NOT DETECTED NOT DETECTED Final    Comment: Performed at [facility], [address], [place], [place] [address]         Radiology Studies: CT Chest W Contrast  Result Date: 12/16/2022 CLINICAL DATA:  Occult malignancy. Low-grade fevers, chills, sweats, nausea and vomiting, abdominal and back pain. Right-sided neck adenopathy. Patient is currently being treated for metastatic colon cancer. EXAM: CT CHEST, ABDOMEN, AND PELVIS WITH CONTRAST TECHNIQUE: Multidetector CT imaging of the chest, abdomen and pelvis was performed following the standard protocol during bolus administration of intravenous contrast. RADIATION DOSE REDUCTION: This exam was performed according to the departmental dose-optimization program which includes automated exposure control, adjustment of the mA and/or kV according to patient size and/or use of iterative reconstruction technique. CONTRAST:  OMNIPAQUE IOHEXOL 300 MG/ML  SOLN COMPARISON:  CT  chest 11/21/2022. CT chest abdomen and pelvis 10/15/2022 FINDINGS: CT CHEST FINDINGS Cardiovascular: Normal heart size. No pericardial effusions. Ascending aortic aneurysm measuring 4 cm AP diameter. No change since prior study. No aortic dissection. Great vessel origins are patent. Mediastinum/Nodes: Esophagus is decompressed. Thyroid gland is unremarkable. No significant lymphadenopathy. Lungs/Pleura: Mild linear atelectasis in the lung bases. Bullous emphysematous changes in the apices. No focal pulmonary parenchymal lesions, consolidation, or airspace disease. No pleural effusions. No pneumothorax. Musculoskeletal: No chest wall mass or suspicious bone lesions identified. CT ABDOMEN PELVIS FINDINGS Hepatobiliary: A biliary stent is in place with residual intrahepatic bile duct dilatation. Bile duct dilatation is increased since prior studies. Pneumobilia consistent with stent placement. Focal hypodense lesion demonstrated in segment 3 of the liver measuring 1.6 cm diameter. This is not demonstrated in the prior study and could be metastatic. Heterogeneous parenchymal enhancement to the liver possibly indicating vascular compromise. Increased density in the gallbladder likely due to vicarious excretion of contrast. Pancreas: Unremarkable. No pancreatic ductal dilatation or surrounding inflammatory changes. Spleen: Normal in size without focal abnormality. Adrenals/Urinary Tract: No adrenal gland nodules. Bilateral intrarenal stones. Largest on the left measures 1.2 cm diameter. No change  since prior studies. No hydronephrosis or hydroureter. Bladder is normal. Stomach/Bowel: Stomach, small bowel, and colon are not abnormally distended. Wall thickening is again demonstrated in the upper rectum possibly correlating to neoplasm or radiation change. No proximal obstruction. Vascular/Lymphatic: Aortic atherosclerosis. No enlarged abdominal or pelvic lymph nodes. Reproductive: Uterus and bilateral adnexa are  unremarkable. Other: Heterogeneous peritoneal and omental nodularity with conglomerate omental nodules measuring up to 2.7 x 1.3 cm in diameter. Omental nodularity seems increased since prior study from 10/15/2022. This may represent progressive metastatic disease. No free air or free fluid in the abdomen. 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 No focal bone lesions. IMPRESSION: 1. Ascending aortic aneurysm measuring 4 cm diameter. Recommend annual imaging followup by CTA or MRA. This recommendation follows 2010 ACCF/AHA/AATS/ACR/ASA/SCA/SCAI/SIR/STS/SVM Guidelines for the Diagnosis and Management of Patients with Thoracic Aortic Disease. Circulation. 2010; 121: W098-J191. Aortic aneurysm NOS (ICD10-I71.9) 2. No evidence of metastatic disease in the chest. Emphysematous changes in the lungs. 3. Biliary stent with residual bile duct dilatation, increasing since prior study. Possible developing nodule in the lateral segment left lobe of the liver may indicate progressive metastatic disease. Heterogeneous enhancement pattern of the liver parenchyma could indicate vascular compromise. 4. Increasing omental nodularity may indicate progressive metastatic disease. 5. Persistent wall thickening in the upper rectum possibly correlates to history of neoplasm or radiation change. 6. Aortic atherosclerosis. 7. 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 8. Bilateral nonobstructing intrarenal stones. Electronically Signed   By: Burman Nieves M.D.   On: 12/16/2022 00:43   CT ABDOMEN PELVIS W CONTRAST  Result Date: 12/16/2022 CLINICAL DATA:  Occult malignancy. Low-grade fevers, chills, sweats, nausea and vomiting, abdominal and back pain. Right-sided neck adenopathy. Patient is currently being treated for metastatic colon cancer. EXAM: CT CHEST, ABDOMEN, AND PELVIS WITH CONTRAST TECHNIQUE: Multidetector CT imaging of the chest, abdomen and pelvis was performed following the  standard protocol during bolus administration of intravenous contrast. RADIATION DOSE REDUCTION: This exam was performed according to the departmental dose-optimization program which includes automated exposure control, adjustment of the mA and/or kV according to patient size and/or use of iterative reconstruction technique. CONTRAST:  OMNIPAQUE IOHEXOL 300 MG/ML  SOLN COMPARISON:  CT chest 11/21/2022. CT chest abdomen and pelvis 10/15/2022 FINDINGS: CT CHEST FINDINGS Cardiovascular: Normal heart size. No pericardial effusions. Ascending aortic aneurysm measuring 4 cm AP diameter. No change since prior study. No aortic dissection. Great vessel origins are patent. Mediastinum/Nodes: Esophagus is decompressed. Thyroid gland is unremarkable. No significant lymphadenopathy. Lungs/Pleura: Mild linear atelectasis in the lung bases. Bullous emphysematous changes in the apices. No focal pulmonary parenchymal lesions, consolidation, or airspace disease. No pleural effusions. No pneumothorax. Musculoskeletal: No chest wall mass or suspicious bone lesions identified. CT ABDOMEN PELVIS FINDINGS Hepatobiliary: A biliary stent is in place with residual intrahepatic bile duct dilatation. Bile duct dilatation is increased since prior studies. Pneumobilia consistent with stent placement. Focal hypodense lesion demonstrated in segment 3 of the liver measuring 1.6 cm diameter. This is not demonstrated in the prior study and could be metastatic. Heterogeneous parenchymal enhancement to the liver possibly indicating vascular compromise. Increased density in the gallbladder likely due to vicarious excretion of contrast. Pancreas: Unremarkable. No pancreatic ductal dilatation or surrounding inflammatory changes. Spleen: Normal in size without focal abnormality. Adrenals/Urinary Tract: No adrenal gland nodules. Bilateral intrarenal stones. Largest on the left measures 1.2 cm diameter. No change since prior studies. No hydronephrosis  or hydroureter. Bladder is normal. Stomach/Bowel: Stomach, small bowel, and colon are not  abnormally distended. Wall thickening is again demonstrated in the upper rectum possibly correlating to neoplasm or radiation change. No proximal obstruction. Vascular/Lymphatic: Aortic atherosclerosis. No enlarged abdominal or pelvic lymph nodes. Reproductive: Uterus and bilateral adnexa are unremarkable. Other: Heterogeneous peritoneal and omental nodularity with conglomerate omental nodules measuring up to 2.7 x 1.3 cm in diameter. Omental nodularity seems increased since prior study from 10/15/2022. This may represent progressive metastatic disease. No free air or free fluid in the abdomen. 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 No focal bone lesions. IMPRESSION: 1. Ascending aortic aneurysm measuring 4 cm diameter. Recommend annual imaging followup by CTA or MRA. This recommendation follows 2010 ACCF/AHA/AATS/ACR/ASA/SCA/SCAI/SIR/STS/SVM Guidelines for the Diagnosis and Management of Patients with Thoracic Aortic Disease. Circulation. 2010; 121: N629-B284. Aortic aneurysm NOS (ICD10-I71.9) 2. No evidence of metastatic disease in the chest. Emphysematous changes in the lungs. 3. Biliary stent with residual bile duct dilatation, increasing since prior study. Possible developing nodule in the lateral segment left lobe of the liver may indicate progressive metastatic disease. Heterogeneous enhancement pattern of the liver parenchyma could indicate vascular compromise. 4. Increasing omental nodularity may indicate progressive metastatic disease. 5. Persistent wall thickening in the upper rectum possibly correlates to history of neoplasm or radiation change. 6. Aortic atherosclerosis. 7. 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 8. Bilateral nonobstructing intrarenal stones. Electronically Signed   By: Burman Nieves M.D.   On: 12/16/2022 00:43   CT Soft Tissue Neck W  Contrast  Result Date: 12/15/2022 CLINICAL DATA:  Right facial swelling EXAM: CT NECK WITH CONTRAST TECHNIQUE: Multidetector CT imaging of the neck was performed using the standard protocol following the bolus administration of intravenous contrast. RADIATION DOSE REDUCTION: This exam was performed according to the departmental dose-optimization program which includes automated exposure control, adjustment of the mA and/or kV according to patient size and/or use of iterative reconstruction technique. CONTRAST:  OMNIPAQUE IOHEXOL 300 MG/ML  SOLN COMPARISON:  None Available. FINDINGS: Pharynx and larynx: Normal. No mass or swelling. Salivary glands: There is a 2 mm calculus of the distal right submandibular duct. Mild ductal dilatation. There is mild inflammatory change adjacent to the right submandibular gland. There is mild intraglandular ductal dilatation of the left submandibular gland. The parotid glands are normal. Thyroid: Normal Lymph nodes: Reactive subcentimeter right submandibular nodes. Vascular: Negative. Limited intracranial: Negative. Visualized orbits: Negative. Mastoids and visualized paranasal sinuses: Clear. Skeleton: Bulky anterior cervical osteophytes. Upper chest: Right apical bulla. Other: None IMPRESSION: 1. A 2 mm calculus of the distal right submandibular duct with mild ductal dilatation and mild inflammatory change adjacent to the right submandibular gland. 2. Mild intraglandular ductal dilatation of the left submandibular gland. Electronically Signed   By: Deatra Robinson M.D.   On: 12/15/2022 23:34   DG Chest Port 1 View  Result Date: 12/15/2022 CLINICAL DATA:  Cough, chills and generalized body aches. EXAM: PORTABLE CHEST 1 VIEW COMPARISON:  July 24, 2022 FINDINGS: The heart size and mediastinal contours are within normal limits. Both lungs are clear. The visualized skeletal structures are unremarkable. IMPRESSION: No active disease. Electronically Signed   By: Aram Candela  M.D.   On: 12/15/2022 20:18   IR PATIENT EVAL TECH 0-60 MINS  Result Date: 12/15/2022 Arby Barrette     12/15/2022  4:25 PM  Patient ID: [name], female   DOB: Apr 16, 1975, 42 y.o.   MRN: [id number] Pt presented to IR dept today as OP for PICC placement to assist with chemotherapy for met colon cancer.  On presentation pt appeared weak and also c/o low grade fevers/chills/sweats, N/V , abd/back pain and rt sided neck adenopathy. Above was discussed with ordering team/Lisa Thomas,NP with oncology and they advised to send pt to ED for further evaluation. PICC placement canceled for now.          Scheduled Meds:  amLODipine  10 mg Oral Daily   enoxaparin (LOVENOX) injection  40 mg Subcutaneous Q24H   metoprolol tartrate  50 mg Oral BID   mometasone-formoterol  2 puff Inhalation BID   morphine  15 mg Oral Q12H   polyethylene glycol  17 g Oral BID   potassium chloride  20 mEq Oral TID   senna-docusate  1 tablet Oral BID   sodium chloride flush  3 mL Intravenous Q12H   umeclidinium bromide  1 puff Inhalation Daily   Continuous Infusions:  ampicillin-sulbactam (UNASYN) IV 3 g (12/16/22 0518)   lactated ringers 100 mL/hr at 12/16/22 0517     LOS: 0 days    Time spent: 35 minutes    Okey Zelek A Laquonda Welby, MD [facility]   If 7PM-7AM, please contact night-coverage [email or web address]  12/16/2022, 8:27 AM

## 2022-12-17 DIAGNOSIS — R112 Nausea with vomiting, unspecified: Secondary | ICD-10-CM | POA: Diagnosis not present

## 2022-12-17 DIAGNOSIS — K1120 Sialoadenitis, unspecified: Secondary | ICD-10-CM

## 2022-12-17 LAB — CBC
HCT: 35.1 % — ABNORMAL LOW (ref 36.0–46.0)
Hemoglobin: 11.6 g/dL — ABNORMAL LOW (ref 12.0–15.0)
MCH: 30.4 pg (ref 26.0–34.0)
MCHC: 33 g/dL (ref 30.0–36.0)
MCV: 92.1 fL (ref 80.0–100.0)
Platelets: 258 10*3/uL (ref 150–400)
RBC: 3.81 MIL/uL — ABNORMAL LOW (ref 3.87–5.11)
RDW: 13.2 % (ref 11.5–15.5)
WBC: 5.6 10*3/uL (ref 4.0–10.5)
nRBC: 0 % (ref 0.0–0.2)

## 2022-12-17 LAB — BASIC METABOLIC PANEL
Anion gap: 8 (ref 5–15)
BUN: <5 mg/dL — ABNORMAL LOW (ref 6–20)
CO2: 28 mmol/L (ref 22–32)
Calcium: 9 mg/dL (ref 8.9–10.3)
Chloride: 99 mmol/L (ref 98–111)
Creatinine, Ser: 0.42 mg/dL — ABNORMAL LOW (ref 0.44–1.00)
GFR, Estimated: >60 mL/min (ref >60)
Glucose, Bld: 89 mg/dL (ref 70–99)
Potassium: 4 mmol/L (ref 3.5–5.1)
Sodium: 135 mmol/L (ref 135–145)

## 2022-12-17 NOTE — Assessment & Plan Note (Signed)
-   Responded well to supportive treatment - Advancing diet as able

## 2022-12-17 NOTE — Hospital Course (Signed)
Quenisha Phelan is a 48 y.o. female with medical history significant for hypertension, emphysema, stage IV colon cancer, and chronic cancer related pain who presents for evaluation of nausea, vomiting, fever, chills, sweats, abdominal and back pain, and right submandibular pain and swelling.   The patient woke due to nausea 4 nights PTA and has had persistent nausea with nonbloody vomiting since.  She has also been experiencing increase in her chronic abdominal pain, tender lump under the mandible on the right side, subjective fevers, chills, and sweats since then.    She was scheduled to have a PICC placed PTA but appeared weak when she arrived for the appointment, complained of fever, chills, sweats, nausea, vomiting, abdominal pain, and the right neck lump.  PICC placement was canceled and she was referred to the ED.   ED Course: Upon arrival to the ED, patient is found to be afebrile and saturating well on room air with normal heart rate and stable blood pressure.  Labs are most notable for sodium 132, potassium 2.8, alkaline phosphatase 212, normal renal function, normal WBC, and negative respiratory virus panel.  CT chest/abdomen/pelvis most notable for emphysematous changes, 4 cm ascending aortic aneurysm, biliary stent with residual biliary dilation, increased omental nodularity, and persistent wall thickening in the upper rectum.  CT neck soft tissues demonstrates 2 mm calculus in distal right submandibular duct with ductal dilation and inflammatory changes.    Patient was treated in the ED with oral and IV potassium, oral nystatin, Reglan x 2, Dilaudid, morphine, and D5W infusion.  She was treated with Unasyn for submandibular sialoadenitis.  She had good response and was transitioned to Augmentin to complete 7-day course at discharge.

## 2022-12-17 NOTE — Plan of Care (Signed)
  Problem: Education: Goal: Knowledge of General Education information will improve Description: Including pain rating scale, medication(s)/side effects and non-pharmacologic comfort measures Outcome: Progressing   Problem: Health Behavior/Discharge Planning: Goal: Ability to manage health-related needs will improve Outcome: Progressing   Problem: Clinical Measurements: Goal: Ability to maintain clinical measurements within normal limits will improve Outcome: Progressing Goal: Will remain free from infection Outcome: Progressing Goal: Diagnostic test results will improve Outcome: Progressing Goal: Respiratory complications will improve Outcome: Progressing Goal: Cardiovascular complication will be avoided Outcome: Progressing   Problem: Activity: Goal: Risk for activity intolerance will decrease Outcome: Progressing   Problem: Nutrition: Goal: Adequate nutrition will be maintained Outcome: Progressing   Problem: Coping: Goal: Level of anxiety will decrease Outcome: Progressing   Problem: Pain Management: Goal: General experience of comfort will improve Outcome: Progressing   Problem: Safety: Goal: Ability to remain free from injury will improve Outcome: Progressing   Problem: Skin Integrity: Goal: Risk for impaired skin integrity will decrease Outcome: Progressing

## 2022-12-17 NOTE — Progress Notes (Signed)
Progress Note    April [name]   ZOX:[id number]  DOB: August 10, 1975  DOA: 12/15/2022     1 PCP: Tollie Eth, NP  Initial CC: N/V, fever  Hospital Course: April [name] is a 47 y.o. female with medical history significant for hypertension, emphysema, stage IV colon cancer, and chronic cancer related pain who presents for evaluation of nausea, vomiting, fever, chills, sweats, abdominal and back pain, and right submandibular pain and swelling.   The patient woke due to nausea 4 nights PTA and has had persistent nausea with nonbloody vomiting since.  She has also been experiencing increase in her chronic abdominal pain, tender lump under the mandible on the right side, subjective fevers, chills, and sweats since then.    She was scheduled to have a PICC placed PTA but appeared weak when she arrived for the appointment, complained of fever, chills, sweats, nausea, vomiting, abdominal pain, and the right neck lump.  PICC placement was canceled and she was referred to the ED.   ED Course: Upon arrival to the ED, patient is found to be afebrile and saturating well on room air with normal heart rate and stable blood pressure.  Labs are most notable for sodium 132, potassium 2.8, alkaline phosphatase 212, normal renal function, normal WBC, and negative respiratory virus panel.  CT chest/abdomen/pelvis most notable for emphysematous changes, 4 cm ascending aortic aneurysm, biliary stent with residual biliary dilation, increased omental nodularity, and persistent wall thickening in the upper rectum.  CT neck soft tissues demonstrates 2 mm calculus in distal right submandibular duct with ductal dilation and inflammatory changes.    Patient was treated in the ED with oral and IV potassium, oral nystatin, Reglan x 2, Dilaudid, morphine, and D5W infusion.    Interval History:  Resting in bed when seen this morning.  Right mandibular pain had improved after sour patch kids last night and fluids.   Unasyn has also been started since admission.  Assessment and Plan:  N/V; dehydration  - N/V has been controlled in ED and she wants to try liquids and advaing - Continue IVF hydration and supportive care, replace electrolytes, trial clears and advance diet as tolerated     Acute sialoadenitis of submandibular gland; salivary gland calculus  - Started on Unasyn, provided sour candies, and encouraged to use warm compress and massage     Hypokalemia  - in setting of N/V and recent poor intake  - Continue replacement   Rectal cancer with metastases  - She was planned to start irinotecan/Panitumumab under the care of Dr. Truett Perna       Malignant biliary obstruction s/p stenting  - Biliary stent in place with residual biliary dilation on CT  - Transaminases and bilirubin are normal  - She is due for stent exchange and will follow-up with [facility]    Hypertension  - Norvasc and amlodipine     COPD  - Not in exacerbation   - Continue ICS-LABA, LAMA, and as-needed albuterol    Ascending aortic aneurysm  - Noted incidentally on CT in ED  - Discussed with patient, outpatient follow-up recommended    Old records reviewed in assessment of this patient  Antimicrobials: Unasyn 12/3 >> current  DVT prophylaxis:  enoxaparin (LOVENOX) injection 40 mg Start: 12/16/22 1000   Code Status:   Code Status: Full Code  Mobility Assessment (Last 72 Hours)     Mobility Assessment     Row Name 12/17/22 1300 12/16/22 2030 12/16/22 1300  Does patient have an order for bedrest or is patient medically unstable No - Continue assessment No - Continue assessment No - Continue assessment     What is the highest level of mobility based on the progressive mobility assessment? Level 5 (Walks with assist in room/hall) - Balance while stepping forward/back and can walk in room with assist - Complete Level 5 (Walks with assist in room/hall) - Balance while stepping forward/back and can walk in  room with assist - Complete Level 5 (Walks with assist in room/hall) - Balance while stepping forward/back and can walk in room with assist - Complete              Barriers to discharge: none Disposition Plan:  Home Status is: Inpt  Objective: Blood pressure (!) 140/107, pulse 82, temperature 98.2 F (36.8 C), temperature source Oral, resp. rate 16, height 5\' 4"  (1.626 m), weight 61.1 kg, last menstrual period 01/07/2006, SpO2 97%.  Examination:  Physical Exam Constitutional:      Appearance: Normal appearance.  HENT:     Head: Normocephalic and atraumatic.     Comments: R mandibular gland palpable with mild tenderness    Mouth/Throat:     Mouth: Mucous membranes are moist.  Eyes:     Extraocular Movements: Extraocular movements intact.  Cardiovascular:     Rate and Rhythm: Normal rate and regular rhythm.  Pulmonary:     Effort: Pulmonary effort is normal. No respiratory distress.     Breath sounds: Normal breath sounds. No wheezing.  Abdominal:     General: Bowel sounds are normal. There is no distension.     Palpations: Abdomen is soft.     Tenderness: There is no abdominal tenderness.  Musculoskeletal:        General: Normal range of motion.     Cervical back: Normal range of motion and neck supple.  Skin:    General: Skin is warm and dry.  Neurological:     General: No focal deficit present.     Mental Status: She is alert.  Psychiatric:        Mood and Affect: Mood normal.      Consultants:  Oncology   Procedures:    Data Reviewed: Results for orders placed or performed during the hospital encounter of 12/15/22 (from the past 24 hour(s))  Basic metabolic panel     Status: Abnormal   Collection Time: 12/17/22  5:00 AM  Result Value Ref Range   Sodium 135 135 - 145 mmol/L   Potassium 4.0 3.5 - 5.1 mmol/L   Chloride 99 98 - 111 mmol/L   CO2 28 22 - 32 mmol/L   Glucose, Bld 89 70 - 99 mg/dL   BUN <5 (L) 6 - 20 mg/dL   Creatinine, Ser 5.78 (L) 0.44 -  1.00 mg/dL   Calcium 9.0 8.9 - 46.9 mg/dL   GFR, Estimated >62 >95 mL/min   Anion gap 8 5 - 15  CBC     Status: Abnormal   Collection Time: 12/17/22  5:00 AM  Result Value Ref Range   WBC 5.6 4.0 - 10.5 K/uL   RBC 3.81 (L) 3.87 - 5.11 MIL/uL   Hemoglobin 11.6 (L) 12.0 - 15.0 g/dL   HCT 28.4 (L) 13.2 - 44.0 %   MCV 92.1 80.0 - 100.0 fL   MCH 30.4 26.0 - 34.0 pg   MCHC 33.0 30.0 - 36.0 g/dL   RDW 10.2 72.5 - 36.6 %   Platelets 258 150 -  400 K/uL   nRBC 0.0 0.0 - 0.2 %   *Note: Due to a large number of results and/or encounters for the requested time period, some results have not been displayed. A complete set of results can be found in Results Review.    I have reviewed pertinent nursing notes, vitals, labs, and images as necessary. I have ordered labwork to follow up on as indicated.  I have reviewed the last notes from staff over past 24 hours. I have discussed patient's care plan and test results with nursing staff, CM/SW, and other staff as appropriate.  Time spent: Greater than 50% of the 55 minute visit was spent in counseling/coordination of care for the patient as laid out in the A&P.   LOS: 1 day   Lewie Chamber, MD [facility] 12/17/2022, 6:24 PM

## 2022-12-17 NOTE — Assessment & Plan Note (Signed)
>>  ASSESSMENT AND PLAN FOR NAUSEA AND VOMITING WRITTEN ON 12/17/2022  6:22 PM BY Lewie Chamber, MD  - Responded well to supportive treatment - Advancing diet as able

## 2022-12-18 DIAGNOSIS — K1120 Sialoadenitis, unspecified: Secondary | ICD-10-CM | POA: Diagnosis not present

## 2022-12-18 DIAGNOSIS — R112 Nausea with vomiting, unspecified: Secondary | ICD-10-CM | POA: Diagnosis not present

## 2022-12-18 MED ORDER — HEPARIN SOD (PORK) LOCK FLUSH 100 UNIT/ML IV SOLN
250.0000 [IU] | INTRAVENOUS | Status: AC | PRN
  Administered 2022-12-18: 250 [IU]

## 2022-12-18 MED ORDER — CHLORTHALIDONE 25 MG PO TABS
25.0000 mg | ORAL_TABLET | Freq: Every day | ORAL | Status: DC
  Administered 2022-12-18: 25 mg via ORAL
  Filled 2022-12-18: qty 1

## 2022-12-18 MED ORDER — ENSURE ENLIVE PO LIQD
237.0000 mL | Freq: Two times a day (BID) | ORAL | Status: DC
  Administered 2022-12-18 (×2): 237 mL via ORAL

## 2022-12-18 MED ORDER — AMOXICILLIN-POT CLAVULANATE 875-125 MG PO TABS: 1.0000 | ORAL_TABLET | Freq: Two times a day (BID) | ORAL | 0 refills | Status: AC

## 2022-12-18 MED ORDER — AMOXICILLIN-POT CLAVULANATE 875-125 MG PO TABS
1.0000 | ORAL_TABLET | Freq: Two times a day (BID) | ORAL | Status: DC
  Administered 2022-12-18: 1 via ORAL
  Filled 2022-12-18: qty 1

## 2022-12-18 NOTE — Progress Notes (Addendum)
Discharge instructions given. No concerns voiced. Pt encouraged to stop by pharmacy and pick up med that was e-prescribed . Voiced understanding. Left unit in wheelchair pushed by [place], NT. Left in stable condition.

## 2022-12-18 NOTE — Progress Notes (Signed)
Received consult to remove picc line. Spoke with primary rn and asked for remove PICC order. Primary RN then stated that pt was to go home with the PICC line in order to receive her chemo. This rn spoke with pt about care of PICC line at home and pt stated that she is seen regularly at [facility] where they do her dressing changes and care for her line and refused any teaching from this RN. Denied need for extension tubing or additional sock at this time. Pt stated she has had a picc line for chemo since 2022 and "I don't need to know anything else and I am ready to go home at this time" Pt stated she is supposed to have a double lumen picc line and she has had one before. Heparin locked both lumens at this time and pt promptly left room.

## 2022-12-18 NOTE — Discharge Summary (Signed)
Physician Discharge Summary   April [name] BJY:[id number] DOB: 1975/05/10 DOA: 12/15/2022  PCP: Tollie Eth, NP  Admit date: 12/15/2022 Discharge date: 12/18/2022   Admitted From: Home Disposition:  Home Discharging physician: Lewie Chamber, MD Barriers to discharge: none  Recommendations at discharge: Follow up with oncology   Discharge Condition: stable CODE STATUS: Full Diet recommendation:  Diet Orders (From admission, onward)     Start     Ordered   12/18/22 0000  Diet general        12/18/22 1147   12/17/22 0708  Diet regular Room service appropriate? Yes; Fluid consistency: Thin  Diet effective now       Question Answer Comment  Room service appropriate? Yes   Fluid consistency: Thin      12/17/22 0707            Hospital Course: April [name] is a 47 y.o. female with medical history significant for hypertension, emphysema, stage IV colon cancer, and chronic cancer related pain who presents for evaluation of nausea, vomiting, fever, chills, sweats, abdominal and back pain, and right submandibular pain and swelling.   The patient woke due to nausea 4 nights PTA and has had persistent nausea with nonbloody vomiting since.  She has also been experiencing increase in her chronic abdominal pain, tender lump under the mandible on the right side, subjective fevers, chills, and sweats since then.    She was scheduled to have a PICC placed PTA but appeared weak when she arrived for the appointment, complained of fever, chills, sweats, nausea, vomiting, abdominal pain, and the right neck lump.  PICC placement was canceled and she was referred to the ED.   ED Course: Upon arrival to the ED, patient is found to be afebrile and saturating well on room air with normal heart rate and stable blood pressure.  Labs are most notable for sodium 132, potassium 2.8, alkaline phosphatase 212, normal renal function, normal WBC, and negative respiratory virus panel.  CT  chest/abdomen/pelvis most notable for emphysematous changes, 4 cm ascending aortic aneurysm, biliary stent with residual biliary dilation, increased omental nodularity, and persistent wall thickening in the upper rectum.  CT neck soft tissues demonstrates 2 mm calculus in distal right submandibular duct with ductal dilation and inflammatory changes.    Patient was treated in the ED with oral and IV potassium, oral nystatin, Reglan x 2, Dilaudid, morphine, and D5W infusion.  She was treated with Unasyn for submandibular sialoadenitis.  She had good response and was transitioned to Augmentin to complete 7-day course at discharge.  Assessment and Plan:  N/V -resolved - N/V has been controlled in ED and she wants to try liquids and advaing -Tolerated diet advancement well   Acute sialoadenitis of submandibular gland; salivary gland calculus  - Started on Unasyn, provided sour candies, and encouraged to use warm compress and massage   -Transitioned to Augmentin at discharge to complete 7-day course   Hypokalemia  - in setting of N/V and recent poor intake  - Continue replacement   Rectal cancer with metastases  - She was planned to start irinotecan/Panitumumab under the care of Dr. Truett Perna       Malignant biliary obstruction s/p stenting  - Biliary stent in place with residual biliary dilation on CT  - Transaminases and bilirubin are normal  - She is due for stent exchange and will follow-up with [facility]    Hypertension  - Norvasc and amlodipine     COPD  - Not  in exacerbation   - Continue ICS-LABA, LAMA, and as-needed albuterol    Ascending aortic aneurysm  - Noted incidentally on CT in ED  - Discussed with patient, outpatient follow-up recommended     The patient's acute and chronic medical conditions were treated accordingly. On day of discharge, patient was felt deemed stable for discharge. Patient/family member advised to call PCP or come back to ER if needed.    Principal Diagnosis: Sialoadenitis of submandibular gland  Discharge Diagnoses: Active Hospital Problems   Diagnosis Date Noted   Sialoadenitis of submandibular gland 12/16/2022    Priority: 1.   Nausea and vomiting 07/24/2022    Priority: 2.   Rectal cancer (HCC) 04/08/2019    Priority: 2.   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   Ascending aortic aneurysm (HCC) 12/16/2022   Biliary obstruction 03/23/2022   Hypokalemia 01/07/2018   Hypertension 01/07/2018    Resolved Hospital Problems  No resolved problems to display.     Discharge Instructions     Diet general   Complete by: As directed    Increase activity slowly   Complete by: As directed       Allergies as of 12/18/2022       Reactions   Lisinopril Swelling, Other (See Comments)   Lip swelling and Angioedema   Irinotecan Other (See Comments)   Stomach cramping Patient given 0.5 mL atropine IV and was able to continue infusion.  See Progress note on 10/03/20.    Leucovorin Calcium Itching   Patient reported itchy palms towards end of transfusion, see progress note 08/04/22.   Oxaliplatin Itching   Patient reported itchy palms towards end of transfusion, see progress note on 08/04/22.        Medication List     STOP taking these medications    dexamethasone 4 MG tablet Commonly known as: DECADRON   diclofenac Sodium 1 % Gel Commonly known as: VOLTAREN       TAKE these medications    Acetaminophen 8 Hour 650 MG CR tablet Generic drug: acetaminophen Take 650 mg by mouth every 8 (eight) hours as needed for pain.   amLODipine 10 MG tablet Commonly known as: NORVASC Take 1 tablet (10 mg total) by mouth daily.   amoxicillin-clavulanate 875-125 MG tablet Commonly known as: AUGMENTIN Take 1 tablet by mouth every 12 (twelve) hours for 5 days.   chlorthalidone 25 MG tablet Commonly known as: HYGROTON Take 1 tablet (25 mg total) by mouth daily.   dicyclomine 20 MG tablet Commonly known as: BENTYL Take  1 tablet (20 mg total) by mouth 3 (three) times daily as needed for spasms (rectal/AB pain).   Dulera 200-5 MCG/ACT Aero Generic drug: mometasone-formoterol Inhale 2 puffs into the lungs 2 (two) times daily.   HYDROcodone-acetaminophen 10-325 MG tablet Commonly known as: Norco Take 1-2 tablets by mouth every 6 (six) hours as needed.   Incruse Ellipta 62.5 MCG/ACT Aepb Generic drug: umeclidinium bromide Inhale 1 puff into the lungs daily.   metoprolol tartrate 50 MG tablet Commonly known as: LOPRESSOR Take 1 tablet (50 mg total) by mouth 2 (two) times daily.   morphine 15 MG 12 hr tablet Commonly known as: MS CONTIN Take 1 tablet (15 mg total) by mouth every 12 (twelve) hours.   polyethylene glycol powder 17 GM/SCOOP powder Commonly known as: MiraLax Take 17 g by mouth 2 (two) times daily.   potassium chloride 10 MEQ CR capsule Commonly known as: MICRO-K Take 2 capsules (20 mEq total) by mouth 2 (  two) times daily.   prochlorperazine 10 MG tablet Commonly known as: COMPAZINE Take 1 tablet (10 mg total) by mouth every 6 (six) hours as needed for nausea or vomiting.   senna-docusate 8.6-50 MG tablet Commonly known as: Senokot-S Take 1 tablet by mouth 2 (two) times daily.        Allergies  Allergen Reactions   Lisinopril Swelling and Other (See Comments)    Lip swelling and Angioedema   Irinotecan Other (See Comments)    Stomach cramping Patient given 0.5 mL atropine IV and was able to continue infusion.  See Progress note on 10/03/20.    Leucovorin Calcium Itching    Patient reported itchy palms towards end of transfusion, see progress note 08/04/22.    Oxaliplatin Itching    Patient reported itchy palms towards end of transfusion, see progress note on 08/04/22.    Consultations:   Procedures:   Discharge Exam: BP (!) 148/111 (BP Location: Right Arm)   Pulse 70   Temp 98.3 F (36.8 C) (Oral)   Resp 18   Ht 5\' 4"  (1.626 m)   Wt 61.1 kg   LMP 01/07/2006    SpO2 99%   BMI 23.12 kg/m  Physical Exam Constitutional:      Appearance: Normal appearance.  HENT:     Head: Normocephalic and atraumatic.     Comments: R mandibular gland palpable with mild tenderness    Mouth/Throat:     Mouth: Mucous membranes are moist.  Eyes:     Extraocular Movements: Extraocular movements intact.  Cardiovascular:     Rate and Rhythm: Normal rate and regular rhythm.  Pulmonary:     Effort: Pulmonary effort is normal. No respiratory distress.     Breath sounds: Normal breath sounds. No wheezing.  Abdominal:     General: Bowel sounds are normal. There is no distension.     Palpations: Abdomen is soft.     Tenderness: There is no abdominal tenderness.  Musculoskeletal:        General: Normal range of motion.     Cervical back: Normal range of motion and neck supple.  Skin:    General: Skin is warm and dry.  Neurological:     General: No focal deficit present.     Mental Status: She is alert.  Psychiatric:        Mood and Affect: Mood normal.      The results of significant diagnostics from this hospitalization (including imaging, microbiology, ancillary and laboratory) are listed below for reference.   Microbiology: Recent Results (from the past 240 hour(s))  Resp panel by RT-PCR (RSV, Flu A&B, Covid) Anterior Nasal Swab     Status: None   Collection Time: 12/15/22  5:19 PM   Specimen: Anterior Nasal Swab  Result Value Ref Range Status   SARS Coronavirus 2 by RT PCR NEGATIVE NEGATIVE Final    Comment: (NOTE) SARS-CoV-2 target nucleic acids are NOT DETECTED.  The SARS-CoV-2 RNA is generally detectable in upper respiratory specimens during the acute phase of infection. The lowest concentration of SARS-CoV-2 viral copies this assay can detect is 138 copies/mL. A negative result does not preclude SARS-Cov-2 infection and should not be used as the sole basis for treatment or other patient management decisions. A negative result may occur with   improper specimen collection/handling, submission of specimen other than nasopharyngeal swab, presence of viral mutation(s) within the areas targeted by this assay, and inadequate number of viral copies(<138 copies/mL). A negative result must be  combined with clinical observations, patient history, and epidemiological information. The expected result is Negative.  Fact Sheet for Patients:  BloggerCourse.com  Fact Sheet for Healthcare Providers:  SeriousBroker.it  This test is no t yet approved or cleared by the [place] FDA and  has been authorized for detection and/or diagnosis of SARS-CoV-2 by FDA under an Emergency Use Authorization (EUA). This EUA will remain  in effect (meaning this test can be used) for the duration of the COVID-19 declaration under Section 564(b)(1) of the Act, 21 U.S.C.section 360bbb-3(b)(1), unless the authorization is terminated  or revoked sooner.       Influenza A by PCR NEGATIVE NEGATIVE Final   Influenza B by PCR NEGATIVE NEGATIVE Final    Comment: (NOTE) The Xpert Xpress SARS-CoV-2/FLU/RSV plus assay is intended as an aid in the diagnosis of influenza from Nasopharyngeal swab specimens and should not be used as a sole basis for treatment. Nasal washings and aspirates are unacceptable for Xpert Xpress SARS-CoV-2/FLU/RSV testing.  Fact Sheet for Patients: BloggerCourse.com  Fact Sheet for Healthcare Providers: SeriousBroker.it  This test is not yet approved or cleared by the [place] FDA and has been authorized for detection and/or diagnosis of SARS-CoV-2 by FDA under an Emergency Use Authorization (EUA). This EUA will remain in effect (meaning this test can be used) for the duration of the COVID-19 declaration under Section 564(b)(1) of the Act, 21 U.S.C. section 360bbb-3(b)(1), unless the authorization is terminated or revoked.      Resp Syncytial Virus by PCR NEGATIVE NEGATIVE Final    Comment: (NOTE) Fact Sheet for Patients: BloggerCourse.com  Fact Sheet for Healthcare Providers: SeriousBroker.it  This test is not yet approved or cleared by the [place] FDA and has been authorized for detection and/or diagnosis of SARS-CoV-2 by FDA under an Emergency Use Authorization (EUA). This EUA will remain in effect (meaning this test can be used) for the duration of the COVID-19 declaration under Section 564(b)(1) of the Act, 21 U.S.C. section 360bbb-3(b)(1), unless the authorization is terminated or revoked.  Performed at [facility], [address], [place], [place] [address]   Group A Strep by PCR     Status: None   Collection Time: 12/15/22  5:19 PM   Specimen: Anterior Nasal Swab; Sterile Swab  Result Value Ref Range Status   Group A Strep by PCR NOT DETECTED NOT DETECTED Final    Comment: Performed at [facility], [address], [place], [place] [address]     Labs: BNP (last 3 results) No results for input(s): "BNP" in the last 8760 hours. Basic Metabolic Panel: Recent Labs  Lab 12/15/22 1719 12/16/22 0505 12/17/22 0500  NA 132* 132* 135  K 2.8* 2.9* 4.0  CL 93* 97* 99  CO2 34* 29 28  GLUCOSE 87 97 89  BUN 10 8 <5*  CREATININE 0.53 0.52 0.42*  CALCIUM 8.8* 8.5* 9.0  MG  --  1.9  --   PHOS  --  3.0  --    Liver Function Tests: Recent Labs  Lab 12/15/22 1719 12/16/22 0505  AST 32 37  ALT 28 28  ALKPHOS 212* 204*  BILITOT 0.4 0.5  PROT 7.8 7.1  ALBUMIN 3.6 3.2*   Recent Labs  Lab 12/15/22 1728  LIPASE 30   No results for input(s): "AMMONIA" in the last 168 hours. CBC: Recent Labs  Lab 12/15/22 1719 12/16/22 0505 12/17/22 0500  WBC 6.8 7.6 5.6  NEUTROABS 3.4  --   --  HGB 12.8 11.6* 11.6*  HCT 37.4 35.8* 35.1*  MCV 91.4 93.5 92.1  PLT 298 272 258   Cardiac Enzymes: No results  for input(s): "CKTOTAL", "CKMB", "CKMBINDEX", "TROPONINI" in the last 168 hours. BNP: Invalid input(s): "POCBNP" CBG: No results for input(s): "GLUCAP" in the last 168 hours. D-Dimer No results for input(s): "DDIMER" in the last 72 hours. Hgb A1c No results for input(s): "HGBA1C" in the last 72 hours. Lipid Profile No results for input(s): "CHOL", "HDL", "LDLCALC", "TRIG", "CHOLHDL", "LDLDIRECT" in the last 72 hours. Thyroid function studies No results for input(s): "TSH", "T4TOTAL", "T3FREE", "THYROIDAB" in the last 72 hours.  Invalid input(s): "FREET3" Anemia work up No results for input(s): "VITAMINB12", "FOLATE", "FERRITIN", "TIBC", "IRON", "RETICCTPCT" in the last 72 hours. Urinalysis    Component Value Date/Time   COLORURINE YELLOW 12/16/2022 0115   APPEARANCEUR CLEAR 12/16/2022 0115   LABSPEC RESULTS UNAVAILABLE DUE TO INTERFERING SUBSTANCE 12/16/2022 0115   PHURINE 6.0 12/16/2022 0115   GLUCOSEU NEGATIVE 12/16/2022 0115   HGBUR SMALL (A) 12/16/2022 0115   BILIRUBINUR NEGATIVE 12/16/2022 0115   KETONESUR 5 (A) 12/16/2022 0115   PROTEINUR NEGATIVE 12/16/2022 0115   NITRITE NEGATIVE 12/16/2022 0115   LEUKOCYTESUR NEGATIVE 12/16/2022 0115   Sepsis Labs Recent Labs  Lab 12/15/22 1719 12/16/22 0505 12/17/22 0500  WBC 6.8 7.6 5.6   Microbiology Recent Results (from the past 240 hour(s))  Resp panel by RT-PCR (RSV, Flu A&B, Covid) Anterior Nasal Swab     Status: None   Collection Time: 12/15/22  5:19 PM   Specimen: Anterior Nasal Swab  Result Value Ref Range Status   SARS Coronavirus 2 by RT PCR NEGATIVE NEGATIVE Final    Comment: (NOTE) SARS-CoV-2 target nucleic acids are NOT DETECTED.  The SARS-CoV-2 RNA is generally detectable in upper respiratory specimens during the acute phase of infection. The lowest concentration of SARS-CoV-2 viral copies this assay can detect is 138 copies/mL. A negative result does not preclude SARS-Cov-2 infection and should not be  used as the sole basis for treatment or other patient management decisions. A negative result may occur with  improper specimen collection/handling, submission of specimen other than nasopharyngeal swab, presence of viral mutation(s) within the areas targeted by this assay, and inadequate number of viral copies(<138 copies/mL). A negative result must be combined with clinical observations, patient history, and epidemiological information. The expected result is Negative.  Fact Sheet for Patients:  BloggerCourse.com  Fact Sheet for Healthcare Providers:  SeriousBroker.it  This test is no t yet approved or cleared by the [place] FDA and  has been authorized for detection and/or diagnosis of SARS-CoV-2 by FDA under an Emergency Use Authorization (EUA). This EUA will remain  in effect (meaning this test can be used) for the duration of the COVID-19 declaration under Section 564(b)(1) of the Act, 21 U.S.C.section 360bbb-3(b)(1), unless the authorization is terminated  or revoked sooner.       Influenza A by PCR NEGATIVE NEGATIVE Final   Influenza B by PCR NEGATIVE NEGATIVE Final    Comment: (NOTE) The Xpert Xpress SARS-CoV-2/FLU/RSV plus assay is intended as an aid in the diagnosis of influenza from Nasopharyngeal swab specimens and should not be used as a sole basis for treatment. Nasal washings and aspirates are unacceptable for Xpert Xpress SARS-CoV-2/FLU/RSV testing.  Fact Sheet for Patients: BloggerCourse.com  Fact Sheet for Healthcare Providers: SeriousBroker.it  This test is not yet approved or cleared by the [place] FDA and has been authorized for detection and/or  diagnosis of SARS-CoV-2 by FDA under an Emergency Use Authorization (EUA). This EUA will remain in effect (meaning this test can be used) for the duration of the COVID-19 declaration under Section  564(b)(1) of the Act, 21 U.S.C. section 360bbb-3(b)(1), unless the authorization is terminated or revoked.     Resp Syncytial Virus by PCR NEGATIVE NEGATIVE Final    Comment: (NOTE) Fact Sheet for Patients: BloggerCourse.com  Fact Sheet for Healthcare Providers: SeriousBroker.it  This test is not yet approved or cleared by the [place] FDA and has been authorized for detection and/or diagnosis of SARS-CoV-2 by FDA under an Emergency Use Authorization (EUA). This EUA will remain in effect (meaning this test can be used) for the duration of the COVID-19 declaration under Section 564(b)(1) of the Act, 21 U.S.C. section 360bbb-3(b)(1), unless the authorization is terminated or revoked.  Performed at [facility], [address] [place], [place] [address]   Group A Strep by PCR     Status: None   Collection Time: 12/15/22  5:19 PM   Specimen: Anterior Nasal Swab; Sterile Swab  Result Value Ref Range Status   Group A Strep by PCR NOT DETECTED NOT DETECTED Final    Comment: Performed at [facility], [address], [place], [place] [address]    Procedures/Studies: [place] EKG SITE RITE  Result Date: 12/16/2022 If Site Rite image not attached, placement could not be confirmed due to current cardiac rhythm.  CT Chest W Contrast  Result Date: 12/16/2022 CLINICAL DATA:  Occult malignancy. Low-grade fevers, chills, sweats, nausea and vomiting, abdominal and back pain. Right-sided neck adenopathy. Patient is currently being treated for metastatic colon cancer. EXAM: CT CHEST, ABDOMEN, AND PELVIS WITH CONTRAST TECHNIQUE: Multidetector CT imaging of the chest, abdomen and pelvis was performed following the standard protocol during bolus administration of intravenous contrast. RADIATION DOSE REDUCTION: This exam was performed according to the departmental dose-optimization program which includes automated  exposure control, adjustment of the mA and/or kV according to patient size and/or use of iterative reconstruction technique. CONTRAST:  OMNIPAQUE IOHEXOL 300 MG/ML  SOLN COMPARISON:  CT chest 11/21/2022. CT chest abdomen and pelvis 10/15/2022 FINDINGS: CT CHEST FINDINGS Cardiovascular: Normal heart size. No pericardial effusions. Ascending aortic aneurysm measuring 4 cm AP diameter. No change since prior study. No aortic dissection. Great vessel origins are patent. Mediastinum/Nodes: Esophagus is decompressed. Thyroid gland is unremarkable. No significant lymphadenopathy. Lungs/Pleura: Mild linear atelectasis in the lung bases. Bullous emphysematous changes in the apices. No focal pulmonary parenchymal lesions, consolidation, or airspace disease. No pleural effusions. No pneumothorax. Musculoskeletal: No chest wall mass or suspicious bone lesions identified. CT ABDOMEN PELVIS FINDINGS Hepatobiliary: A biliary stent is in place with residual intrahepatic bile duct dilatation. Bile duct dilatation is increased since prior studies. Pneumobilia consistent with stent placement. Focal hypodense lesion demonstrated in segment 3 of the liver measuring 1.6 cm diameter. This is not demonstrated in the prior study and could be metastatic. Heterogeneous parenchymal enhancement to the liver possibly indicating vascular compromise. Increased density in the gallbladder likely due to vicarious excretion of contrast. Pancreas: Unremarkable. No pancreatic ductal dilatation or surrounding inflammatory changes. Spleen: Normal in size without focal abnormality. Adrenals/Urinary Tract: No adrenal gland nodules. Bilateral intrarenal stones. Largest on the left measures 1.2 cm diameter. No change since prior studies. No hydronephrosis or hydroureter. Bladder is normal. Stomach/Bowel: Stomach, small bowel, and colon are not abnormally distended. Wall thickening is again demonstrated in the upper rectum possibly correlating to  neoplasm  or radiation change. No proximal obstruction. Vascular/Lymphatic: Aortic atherosclerosis. No enlarged abdominal or pelvic lymph nodes. Reproductive: Uterus and bilateral adnexa are unremarkable. Other: Heterogeneous peritoneal and omental nodularity with conglomerate omental nodules measuring up to 2.7 x 1.3 cm in diameter. Omental nodularity seems increased since prior study from 10/15/2022. This may represent progressive metastatic disease. No free air or free fluid in the abdomen. 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 No focal bone lesions. IMPRESSION: 1. Ascending aortic aneurysm measuring 4 cm diameter. Recommend annual imaging followup by CTA or MRA. This recommendation follows 2010 ACCF/AHA/AATS/ACR/ASA/SCA/SCAI/SIR/STS/SVM Guidelines for the Diagnosis and Management of Patients with Thoracic Aortic Disease. Circulation. 2010; 121: D638-V564. Aortic aneurysm NOS (ICD10-I71.9) 2. No evidence of metastatic disease in the chest. Emphysematous changes in the lungs. 3. Biliary stent with residual bile duct dilatation, increasing since prior study. Possible developing nodule in the lateral segment left lobe of the liver may indicate progressive metastatic disease. Heterogeneous enhancement pattern of the liver parenchyma could indicate vascular compromise. 4. Increasing omental nodularity may indicate progressive metastatic disease. 5. Persistent wall thickening in the upper rectum possibly correlates to history of neoplasm or radiation change. 6. Aortic atherosclerosis. 7. 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 8. Bilateral nonobstructing intrarenal stones. Electronically Signed   By: Burman Nieves M.D.   On: 12/16/2022 00:43   CT ABDOMEN PELVIS W CONTRAST  Result Date: 12/16/2022 CLINICAL DATA:  Occult malignancy. Low-grade fevers, chills, sweats, nausea and vomiting, abdominal and back pain. Right-sided neck adenopathy. Patient is currently being treated  for metastatic colon cancer. EXAM: CT CHEST, ABDOMEN, AND PELVIS WITH CONTRAST TECHNIQUE: Multidetector CT imaging of the chest, abdomen and pelvis was performed following the standard protocol during bolus administration of intravenous contrast. RADIATION DOSE REDUCTION: This exam was performed according to the departmental dose-optimization program which includes automated exposure control, adjustment of the mA and/or kV according to patient size and/or use of iterative reconstruction technique. CONTRAST:  OMNIPAQUE IOHEXOL 300 MG/ML  SOLN COMPARISON:  CT chest 11/21/2022. CT chest abdomen and pelvis 10/15/2022 FINDINGS: CT CHEST FINDINGS Cardiovascular: Normal heart size. No pericardial effusions. Ascending aortic aneurysm measuring 4 cm AP diameter. No change since prior study. No aortic dissection. Great vessel origins are patent. Mediastinum/Nodes: Esophagus is decompressed. Thyroid gland is unremarkable. No significant lymphadenopathy. Lungs/Pleura: Mild linear atelectasis in the lung bases. Bullous emphysematous changes in the apices. No focal pulmonary parenchymal lesions, consolidation, or airspace disease. No pleural effusions. No pneumothorax. Musculoskeletal: No chest wall mass or suspicious bone lesions identified. CT ABDOMEN PELVIS FINDINGS Hepatobiliary: A biliary stent is in place with residual intrahepatic bile duct dilatation. Bile duct dilatation is increased since prior studies. Pneumobilia consistent with stent placement. Focal hypodense lesion demonstrated in segment 3 of the liver measuring 1.6 cm diameter. This is not demonstrated in the prior study and could be metastatic. Heterogeneous parenchymal enhancement to the liver possibly indicating vascular compromise. Increased density in the gallbladder likely due to vicarious excretion of contrast. Pancreas: Unremarkable. No pancreatic ductal dilatation or surrounding inflammatory changes. Spleen: Normal in size without focal  abnormality. Adrenals/Urinary Tract: No adrenal gland nodules. Bilateral intrarenal stones. Largest on the left measures 1.2 cm diameter. No change since prior studies. No hydronephrosis or hydroureter. Bladder is normal. Stomach/Bowel: Stomach, small bowel, and colon are not abnormally distended. Wall thickening is again demonstrated in the upper rectum possibly correlating to neoplasm or radiation change. No proximal obstruction. Vascular/Lymphatic: Aortic atherosclerosis. No enlarged abdominal or pelvic lymph nodes. Reproductive:  Uterus and bilateral adnexa are unremarkable. Other: Heterogeneous peritoneal and omental nodularity with conglomerate omental nodules measuring up to 2.7 x 1.3 cm in diameter. Omental nodularity seems increased since prior study from 10/15/2022. This may represent progressive metastatic disease. No free air or free fluid in the abdomen. 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 No focal bone lesions. IMPRESSION: 1. Ascending aortic aneurysm measuring 4 cm diameter. Recommend annual imaging followup by CTA or MRA. This recommendation follows 2010 ACCF/AHA/AATS/ACR/ASA/SCA/SCAI/SIR/STS/SVM Guidelines for the Diagnosis and Management of Patients with Thoracic Aortic Disease. Circulation. 2010; 121: Q657-Q469. Aortic aneurysm NOS (ICD10-I71.9) 2. No evidence of metastatic disease in the chest. Emphysematous changes in the lungs. 3. Biliary stent with residual bile duct dilatation, increasing since prior study. Possible developing nodule in the lateral segment left lobe of the liver may indicate progressive metastatic disease. Heterogeneous enhancement pattern of the liver parenchyma could indicate vascular compromise. 4. Increasing omental nodularity may indicate progressive metastatic disease. 5. Persistent wall thickening in the upper rectum possibly correlates to history of neoplasm or radiation change. 6. Aortic atherosclerosis. 7. Spondylolysis with  moderate spondylolisthesis at L5-S1. 8. Bilateral nonobstructing intrarenal stones. Electronically Signed   By: Burman Nieves M.D.   On: 12/16/2022 00:43   CT Soft Tissue Neck W Contrast  Result Date: 12/15/2022 CLINICAL DATA:  Right facial swelling EXAM: CT NECK WITH CONTRAST TECHNIQUE: Multidetector CT imaging of the neck was performed using the standard protocol following the bolus administration of intravenous contrast. RADIATION DOSE REDUCTION: This exam was performed according to the departmental dose-optimization program which includes automated exposure control, adjustment of the mA and/or kV according to patient size and/or use of iterative reconstruction technique. CONTRAST:  OMNIPAQUE IOHEXOL 300 MG/ML  SOLN COMPARISON:  None Available. FINDINGS: Pharynx and larynx: Normal. No mass or swelling. Salivary glands: There is a 2 mm calculus of the distal right submandibular duct. Mild ductal dilatation. There is mild inflammatory change adjacent to the right submandibular gland. There is mild intraglandular ductal dilatation of the left submandibular gland. The parotid glands are normal. Thyroid: Normal Lymph nodes: Reactive subcentimeter right submandibular nodes. Vascular: Negative. Limited intracranial: Negative. Visualized orbits: Negative. Mastoids and visualized paranasal sinuses: Clear. Skeleton: Bulky anterior cervical osteophytes. Upper chest: Right apical bulla. Other: None IMPRESSION: 1. A 2 mm calculus of the distal right submandibular duct with mild ductal dilatation and mild inflammatory change adjacent to the right submandibular gland. 2. Mild intraglandular ductal dilatation of the left submandibular gland. Electronically Signed   By: Deatra Robinson M.D.   On: 12/15/2022 23:34   DG Chest Port 1 View  Result Date: 12/15/2022 CLINICAL DATA:  Cough, chills and generalized body aches. EXAM: PORTABLE CHEST 1 VIEW COMPARISON:  July 24, 2022 FINDINGS: The heart size and mediastinal  contours are within normal limits. Both lungs are clear. The visualized skeletal structures are unremarkable. IMPRESSION: No active disease. Electronically Signed   By: Aram Candela M.D.   On: 12/15/2022 20:18   IR PATIENT EVAL TECH 0-60 MINS  Result Date: 12/15/2022 Arby Barrette     12/15/2022  4:25 PM  Patient ID: [name], female   DOB: 05/22/1975, 47 y.o.   MRN: [id number] Pt presented to IR dept today as OP for PICC placement to assist with chemotherapy for met colon cancer. On presentation pt appeared weak and also c/o low grade fevers/chills/sweats, N/V , abd/back pain and rt sided neck adenopathy. Above was discussed with ordering team/Lisa Thomas,NP with oncology and they advised  to send pt to ED for further evaluation. PICC placement canceled for now.     CT Angio Chest Pulmonary Embolism (PE) W or WO Contrast  Result Date: 11/21/2022 CLINICAL DATA:  High probability for PE. History of metastatic colon cancer. EXAM: CT ANGIOGRAPHY CHEST WITH CONTRAST TECHNIQUE: Multidetector CT imaging of the chest was performed using the standard protocol during bolus administration of intravenous contrast. Multiplanar CT image reconstructions and MIPs were obtained to evaluate the vascular anatomy. RADIATION DOSE REDUCTION: This exam was performed according to the departmental dose-optimization program which includes automated exposure control, adjustment of the mA and/or kV according to patient size and/or use of iterative reconstruction technique. CONTRAST:  75mL OMNIPAQUE IOHEXOL 350 MG/ML SOLN COMPARISON:  CT chest abdomen and pelvis 10/15/2022. CT abdomen and pelvis 11/19/2022. FINDINGS: Cardiovascular: Satisfactory opacification of the pulmonary arteries to the segmental level. No evidence of pulmonary embolism. Normal heart size. No pericardial effusion. Mediastinum/Nodes: No enlarged mediastinal, hilar, or axillary lymph nodes. Thyroid gland, trachea, and esophagus demonstrate no  significant findings. Lungs/Pleura: Emphysematous changes are similar to prior. There is no focal lung infiltrate, pleural effusion or pneumothorax. Upper Abdomen: Again seen are bile duct stents with intrahepatic biliary ductal dilatation and pneumobilia similar to prior. Stable subcentimeter rounded hypodensity in the left lobe of the liver. Ill-defined hypodense lesion in the left lobe of the liver image 4/96 measures 15 mm and was not definitely seen prior exam 2 days ago. Musculoskeletal: No chest wall abnormality. No acute or significant osseous findings. Review of the MIP images confirms the above findings. IMPRESSION: 1. No evidence for pulmonary embolism or other acute cardiopulmonary process. 2. Stable emphysematous changes. 3. Stable bile duct stents with intrahepatic biliary ductal dilatation and pneumobilia. 4. New 15 mm hypodense lesion in the left lobe of the liver, indeterminate. This is not seen on 11/19/2022. Recommend clinical correlation and follow-up to exclude metastatic disease. Emphysema (ICD10-J43.9). Electronically Signed   By: Darliss Cheney M.D.   On: 11/21/2022 15:38   CT ABDOMEN PELVIS W CONTRAST  Result Date: 11/19/2022 CLINICAL DATA:  Colon cancer, recent chemotherapy. Abdominal pain with low back pain and pelvic pain. * Tracking Code: BO * EXAM: CT ABDOMEN AND PELVIS WITH CONTRAST TECHNIQUE: Multidetector CT imaging of the abdomen and pelvis was performed using the standard protocol following bolus administration of intravenous contrast. RADIATION DOSE REDUCTION: This exam was performed according to the departmental dose-optimization program which includes automated exposure control, adjustment of the mA and/or kV according to patient size and/or use of iterative reconstruction technique. CONTRAST:  80mL OMNIPAQUE IOHEXOL 300 MG/ML  SOLN COMPARISON:  10/15/2022 CT scan FINDINGS: Lower chest: Unremarkable Hepatobiliary: Two biliary stents remain in place dilated and thick-walled  right and left hepatic ducts with associated surrounding hypoenhancement similar to the 10/15/2022 exam. Small amount of pneumobilia. The overall degree of peripheral intrahepatic biliary dilatation is minimally increased from prior. 6 mm left hepatic lobe cyst on image 7 series 2 unchanged. Pancreas: Mild dilation of the dorsal pancreatic duct in the pancreatic head similar to prior. Spleen: Unremarkable Adrenals/Urinary Tract: Stable appearance of bilateral nonobstructive renal calculi. Mild scarring in portions of the right kidney. Both adrenal glands appear normal. No hydronephrosis or hydroureter. Urinary bladder relatively empty but otherwise unremarkable. Stomach/Bowel: Substantial circumferential wall thickening in the rectum, residual tumor not excluded. No current dilated bowel. Normal appendix. Vascular/Lymphatic: Mild atheromatous vascular calcification of the common iliac arteries. Reproductive: Poor definition of fat planes between the uterus on the thick-walled rectum. Other:  Enlarging tumor studding along the left paracolic gutter, with a nodule for example measuring 1.6 by 1.3 cm on image 34 series 2 and previously measuring 0.9 by 0.8 cm. Additional tumor studding along the left paracolic gutter is worsened. Mild change in orientation of the omentum complicates comparison of the omental nodularity. Most of this nodularity is fairly stable although components such as the 1.2 by 1.1 cm omental soft tissue nodule on image 46 of series 2 appear increased. Musculoskeletal: Moderate to prominent left and moderate right degenerative hip arthropathy. Levoconvex lumbar scoliosis. 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 IMPRESSION: 1. Worsening tumor studding along the left paracolic gutter. 2. Mildly worsening omental nodularity. 3. Substantial circumferential wall thickening in the rectum, residual tumor not excluded. Poor definition of fat planes between the  uterus and the thick-walled rectum. 4. Two biliary stents remain in place with dilated and thick-walled right and left hepatic ducts with associated surrounding hypoenhancement similar to the 10/15/2022 exam. The overall degree of peripheral intrahepatic biliary dilatation is minimally increased from prior. 5. Stable appearance of bilateral nonobstructive renal calculi. 6. 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 7. Moderate to prominent left and moderate right degenerative hip arthropathy. 8. Levoconvex lumbar scoliosis. 9. Mild atheromatous vascular calcification of the common iliac arteries. Electronically Signed   By: Gaylyn Rong M.D.   On: 11/19/2022 20:00   DG Abd 2 Views  Result Date: 11/19/2022 CLINICAL DATA:  Abdominal pain.  History of metastatic colon cancer EXAM: ABDOMEN - 2 VIEW COMPARISON:  06/23/2022; CT abdomen pelvis-06/25/2022 FINDINGS: Paucity of bowel gas without definitive evidence of enteric obstruction. No pneumoperitoneum, pneumatosis or portal venous gas Punctate opacities overlie the bilateral renal fossa compatible with known bilateral nephrolithiasis. Punctate phlebolith overlies the right hemipelvis, unchanged. Biliary stents overlie the right upper abdominal quadrant. Limited visualization of the lower thorax is normal given obliquity and technique Mild scoliotic curvature of the thoracolumbar spine. No definite acute osseous abnormalities. IMPRESSION: 1. Paucity of bowel gas without definitive evidence of enteric obstruction. 2. Bilateral nephrolithiasis. Electronically Signed   By: Simonne Come M.D.   On: 11/19/2022 15:38     Time coordinating discharge: Over 30 minutes    Lewie Chamber, MD  [facility] 12/18/2022, 4:40 PM

## 2022-12-19 ENCOUNTER — Encounter: Payer: Self-pay | Admitting: *Deleted

## 2022-12-19 NOTE — Progress Notes (Signed)
Per managed care: chemo for 12/09 has been denied by [facility]. Appeal process has started. MD aware. Will keep her appointments on 12/22/22 and cancel if not approved. Still needs to see MD and have PICC flushed.

## 2022-12-20 ENCOUNTER — Other Ambulatory Visit: Payer: Self-pay | Admitting: Nurse Practitioner

## 2022-12-20 ENCOUNTER — Other Ambulatory Visit (HOSPITAL_COMMUNITY): Payer: Self-pay

## 2022-12-20 DIAGNOSIS — C786 Secondary malignant neoplasm of retroperitoneum and peritoneum: Secondary | ICD-10-CM

## 2022-12-20 DIAGNOSIS — C20 Malignant neoplasm of rectum: Secondary | ICD-10-CM

## 2022-12-22 ENCOUNTER — Other Ambulatory Visit (HOSPITAL_BASED_OUTPATIENT_CLINIC_OR_DEPARTMENT_OTHER): Payer: Self-pay

## 2022-12-22 ENCOUNTER — Inpatient Hospital Stay: Payer: 59

## 2022-12-22 ENCOUNTER — Other Ambulatory Visit: Payer: Self-pay

## 2022-12-22 ENCOUNTER — Inpatient Hospital Stay: Payer: 59 | Admitting: Oncology

## 2022-12-22 VITALS — BP 148/90 | HR 92 | Temp 97.8°F | Resp 18 | Ht 64.0 in | Wt 140.0 lb

## 2022-12-22 DIAGNOSIS — C20 Malignant neoplasm of rectum: Secondary | ICD-10-CM | POA: Diagnosis not present

## 2022-12-22 DIAGNOSIS — C786 Secondary malignant neoplasm of retroperitoneum and peritoneum: Secondary | ICD-10-CM

## 2022-12-22 DIAGNOSIS — Z95828 Presence of other vascular implants and grafts: Secondary | ICD-10-CM

## 2022-12-22 DIAGNOSIS — K529 Noninfective gastroenteritis and colitis, unspecified: Secondary | ICD-10-CM | POA: Diagnosis not present

## 2022-12-22 DIAGNOSIS — Z452 Encounter for adjustment and management of vascular access device: Secondary | ICD-10-CM | POA: Diagnosis not present

## 2022-12-22 DIAGNOSIS — C19 Malignant neoplasm of rectosigmoid junction: Secondary | ICD-10-CM

## 2022-12-22 DIAGNOSIS — A4159 Other Gram-negative sepsis: Secondary | ICD-10-CM | POA: Diagnosis not present

## 2022-12-22 DIAGNOSIS — C187 Malignant neoplasm of sigmoid colon: Secondary | ICD-10-CM | POA: Diagnosis present

## 2022-12-22 DIAGNOSIS — Z5112 Encounter for antineoplastic immunotherapy: Secondary | ICD-10-CM | POA: Diagnosis present

## 2022-12-22 DIAGNOSIS — Z5111 Encounter for antineoplastic chemotherapy: Secondary | ICD-10-CM | POA: Diagnosis present

## 2022-12-22 DIAGNOSIS — Z79899 Other long term (current) drug therapy: Secondary | ICD-10-CM | POA: Diagnosis not present

## 2022-12-22 DIAGNOSIS — C7951 Secondary malignant neoplasm of bone: Secondary | ICD-10-CM | POA: Diagnosis not present

## 2022-12-22 LAB — CMP (CANCER CENTER ONLY)
ALT: 64 U/L — ABNORMAL HIGH (ref 0–44)
AST: 47 U/L — ABNORMAL HIGH (ref 15–41)
Albumin: 4.3 g/dL (ref 3.5–5.0)
Alkaline Phosphatase: 368 U/L — ABNORMAL HIGH (ref 38–126)
Anion gap: 10 (ref 5–15)
BUN: 16 mg/dL (ref 6–20)
CO2: 31 mmol/L (ref 22–32)
Calcium: 10 mg/dL (ref 8.9–10.3)
Chloride: 95 mmol/L — ABNORMAL LOW (ref 98–111)
Creatinine: 0.63 mg/dL (ref 0.44–1.00)
GFR, Estimated: >60 mL/min (ref >60)
Glucose, Bld: 111 mg/dL — ABNORMAL HIGH (ref 70–99)
Potassium: 3.3 mmol/L — ABNORMAL LOW (ref 3.5–5.1)
Sodium: 136 mmol/L (ref 135–145)
Total Bilirubin: 0.2 mg/dL (ref <1.2)
Total Protein: 8.4 g/dL — ABNORMAL HIGH (ref 6.5–8.1)

## 2022-12-22 LAB — CBC WITH DIFFERENTIAL (CANCER CENTER ONLY)
Abs Immature Granulocytes: 0.03 10*3/uL (ref 0.00–0.07)
Basophils Absolute: 0.1 10*3/uL (ref 0.0–0.1)
Basophils Relative: 1 %
Eosinophils Absolute: 0.8 10*3/uL — ABNORMAL HIGH (ref 0.0–0.5)
Eosinophils Relative: 9 %
HCT: 39.9 % (ref 36.0–46.0)
Hemoglobin: 13.2 g/dL (ref 12.0–15.0)
Immature Granulocytes: 0 %
Lymphocytes Relative: 39 %
Lymphs Abs: 3.3 10*3/uL (ref 0.7–4.0)
MCH: 30 pg (ref 26.0–34.0)
MCHC: 33.1 g/dL (ref 30.0–36.0)
MCV: 90.7 fL (ref 80.0–100.0)
Monocytes Absolute: 0.7 10*3/uL (ref 0.1–1.0)
Monocytes Relative: 8 %
Neutro Abs: 3.6 10*3/uL (ref 1.7–7.7)
Neutrophils Relative %: 43 %
Platelet Count: 382 10*3/uL (ref 150–400)
RBC: 4.4 MIL/uL (ref 3.87–5.11)
RDW: 13.7 % (ref 11.5–15.5)
WBC Count: 8.5 10*3/uL (ref 4.0–10.5)
nRBC: 0 % (ref 0.0–0.2)

## 2022-12-22 LAB — MAGNESIUM: Magnesium: 1.8 mg/dL (ref 1.7–2.4)

## 2022-12-22 MED ORDER — HYDROCODONE-ACETAMINOPHEN 10-325 MG PO TABS
1.0000 | ORAL_TABLET | Freq: Four times a day (QID) | ORAL | 0 refills | Status: DC | PRN
  Filled 2022-12-22: qty 60, 15d supply, fill #0

## 2022-12-22 MED ORDER — SODIUM CHLORIDE 0.9% FLUSH
10.0000 mL | Freq: Once | INTRAVENOUS | Status: AC
  Administered 2022-12-22: 10 mL

## 2022-12-22 MED ORDER — HEPARIN SOD (PORK) LOCK FLUSH 100 UNIT/ML IV SOLN
500.0000 [IU] | Freq: Once | INTRAVENOUS | Status: AC
  Administered 2022-12-22: 500 [IU]

## 2022-12-22 MED ORDER — SODIUM CHLORIDE 0.9 % IV SOLN
INTRAVENOUS | Status: DC
Start: 2022-12-22 — End: 2022-12-22

## 2022-12-22 NOTE — Progress Notes (Unsigned)
[facility] OFFICE PROGRESS NOTE   Diagnosis: Colon cancer  INTERVAL HISTORY:   Ms. Garrahan was discharged from the hospital 12/16/2022 after admission with inflammation of the right submandibular gland and abdominal pain.  She reports the fullness at the right submandibular area has resolved.  She continues to have cramping abdominal pain.  She takes 4-6 hydrocodone tablets per day in addition to MS Contin.  She is having bowel movements.  Objective:  Vital signs in last 24 hours:  Blood pressure (!) 148/90, pulse 92, temperature 97.8 F (36.6 C), temperature source Temporal, resp. rate 18, height 5\' 4"  (1.626 m), weight 140 lb (63.5 kg), last menstrual period 01/07/2006, SpO2 100%.    HEENT: White coat over the tongue, no buccal thrush Resp: Lungs clear bilaterally Cardio: Regular rate and rhythm GI: Tender in the right upper abdomen, no mass, no hepatosplenomegaly, soft Vascular: No leg edema    Portacath/PICC-without erythema  Lab Results:  Lab Results  Component Value Date   WBC 8.5 12/22/2022   HGB 13.2 12/22/2022   HCT 39.9 12/22/2022   MCV 90.7 12/22/2022   PLT 382 12/22/2022   NEUTROABS 3.6 12/22/2022    CMP  Lab Results  Component Value Date   NA 136 12/22/2022   K 3.3 (L) 12/22/2022   CL 95 (L) 12/22/2022   CO2 31 12/22/2022   GLUCOSE 111 (H) 12/22/2022   BUN 16 12/22/2022   CREATININE 0.63 12/22/2022   CALCIUM 10.0 12/22/2022   PROT 8.4 (H) 12/22/2022   ALBUMIN 4.3 12/22/2022   AST 47 (H) 12/22/2022   ALT 64 (H) 12/22/2022   ALKPHOS 368 (H) 12/22/2022   BILITOT 0.2 12/22/2022   GFRNONAA >60 12/22/2022   GFRAA >60 10/17/2019    Lab Results  Component Value Date   CEA1 <0.6 03/23/2022   CEA 5.90 (H) 12/03/2022   ZOX096 213 (H) 03/23/2022   Medications: I have reviewed the patient's current medications.   Assessment/Plan:    Sigmoid colon cancer, stage IV (pT4a,pN2b,M1c) Colonoscopy 03/24/2019-3 rectal  polyps-hyperplastic polyps, distal colon biopsy-at least intramucosal adenocarcinoma, completely obstructing mass in the distal sigmoid colon, could not be traversed, intact mismatch repair protein expression 03/24/2019-CEA 56.1 03/29/2019 CT abdomen/pelvis-circumferential thickening involving the entire mid and distal sigmoid colon to the level of the rectosigmoid junction, solid/cystic mass in the left ovary, bilateral nephrolithiasis Robotic assisted low anterior resection, mesenteric lymphadenectomy, bilateral salpingo-oophorectomy 04/08/2019 Pathology ([facility] review of outside pathology) metastatic adenocarcinoma involving the peritoneum overlying the round ligament, serosa of the urinary bladder, serosa of the right ureter a sacral area, pelvic peritoneum, and left ovary.  Omental biopsy with focal mucin pools with no carcinoma cells identified, right hemidiaphragm biopsy involved by metastatic adenocarcinoma, invasive adenocarcinoma the sigmoid colon, moderately differentiated, T4a, perineural and vascular invasion present, 7/22 lymph nodes, multiple tumor deposits, resection margins negative Negative for PD-L1, low probability of MSI-high, HER-2 negative, negative for BRAF, NRAS and KRAS alterations CTs 05/19/2019-no evidence of metastatic disease, findings suspicious for colitis of the transverse and ascending colon, small amount of ascites in the cul-de-sac, bilateral renal calculi Cycle 1 FOLFIRI 05/24/2019, bevacizumab added with cycle 2 Cycle 4 FOLFIRI/bevacizumab 07/05/2019 Cycle 5 FOLFIRI 07/27/2019, bevacizumab held secondary to hypertension Cycle 6 FOLFIRI 08/10/1999, bevacizumab held secondary to hypertension CTs 08/30/2019-no evidence of recurrent disease, new subsolid right lower lobe nodule felt to be inflammatory, emphysema Cycle 7 FOLFIRI 09/26/2019, bevacizumab remains on hold secondary to hypertension Cycle 8 FOLFIRI 10/17/2019, bevacizumab held secondary to hypertension, Greggory Keen  added for  neutropenia Cycle 9 FOLFIRI 11/07/2019, bevacizumab held secondary to hypertension, Udenyca Cycle 10 FOLFIRI 11/28/2019, bevacizumab held, Udenyca Cycle 11 FOLFIRI 12/26/2019, bevacizumab held, Udenyca CTs 01/25/2020-no evidence of recurrent disease, resolution of right lower lobe nodule Maintenance Xeloda beginning 02/06/2020 CTs 04/25/2020- no evidence of metastatic disease, multiple bilateral renal calculi without hydronephrosis Maintenance Xeloda continued CT abdomen/pelvis without contrast 08/10/2020-bilateral staghorn renal calculi, no evidence of metastatic disease; addendum 08/23/2020-small but increasing omental nodules. Cycle 1 FOLFIRI/panitumumab 09/19/2020 Cycle 2 FOLFIRI/Panitumumab 10/03/2020 Cycle 3 FOLFIRI/Panitumumab 10/17/2020 Cycle 4 FOLFIRI/panitumumab 10/31/2020 Cycle 5 FOLFIRI/Panitumumab 11/14/2020 CTs 11/26/2020-stable omental metastases compared to 10/22/2020, mildly decreased from 08/10/2020.  Left lower quadrant tiny paracolic gutter implant slightly decreased from CT 08/10/2020.  No new or progressive metastatic disease in the abdomen or pelvis. Cycle 6 FOLFIRI/Panitumumab 11/28/2020, irinotecan dose reduced, treatment schedule adjusted to every 3 weeks going forward Cycle 7 FOLFIRI/panitumumab 12/24/2020 Cycle 8 FOLFIRI/Panitumumab 01/16/2021--treatment held due to hypertension in the infusion area. Cycle 8 FOLFIRI/panitumumab 01/21/2021 Cycle 9 FOLFIRI/panitumumab 02/11/2021 Cycle 10 FOLFIRI/Panitumumab 03/04/2021 Cycle 11 FOLFIRI/Panitumumab 03/25/2021 CT abdomen/pelvis 04/10/2021-stable right omental and left posterior paracolic gutter nodules Cycle 12 FOLFIRI/panitumumab 04/15/2021 Cycle 13 FOLFIRI/panitumumab 05/06/2021 Cycle 14 FOLFIRI/Panitumumab 05/27/2021 Cycle 15 FOLFIRI/Panitumumab 06/17/2021 CT abdomen/pelvis 07/05/2021-slight enlargement of a dominant right omental implant, no new implants Patient requested a treatment break CT abdomen/pelvis 10/04/2021-mild increase in  size of multiple omental soft tissue nodules, 4 mm calculus in the distal left ureter adjacent to the ureteral stent Cycle 1 Lonsurf 10/14/2021 10/28/2021 Avastin every 2 weeks Cycle 2 Lonsurf 11/11/2021 Cycle 3 Lonsurf 12/09/2021 Cycle 4 Lonsurf 01/06/2022 Avastin held 01/20/2022 due to proteinuria, 24-hour urine 43 mg CT/pelvis 01/30/2022-possible new peritoneal implant at the transverse colon, no significant change in other omental/peritoneal implants, stable chronic rectal wall thickening, status post removal of double-J left ureteral stent with no evidence of hydronephrosis or ureteral calculus, nonobstructing bilateral renal calculi Cycle 5 Lonsurf 02/05/2022 or 02/06/2022 Avastin every 2 weeks Cycle 6 Lonsurf 03/03/2022 CTs 04/05/2022-increase in size and number of peritoneal implants compared to January 2024 central liver mass narrowing the anterior branch of the right portal vein, decreased biliary duct dilation, biliary stents in place, stable right cardiophrenic lymph nodes, separate pigtail stent in the lumen of the proximal duodenum Cycle 1 FOLFOX 04/14/2022 CT abdomen/pelvis 04/16/2022-stent within the third portion of the duodenum, stable peritoneal nodules, indwelling biliary stents with mild left biliary duct dilation Cycle 2 FOLFOX 04/28/2022, Emend and prophylactic dexamethasone added Cycle 3 FOLFOX 05/12/2022, Aloxi, Emend, prophylactic dexamethasone, Compazine and lorazepam as needed Chemotherapy held 05/25/2020 due to severe hypertension, evaluated in the emergency department Cycle 4 FOLFOX 06/03/2022 Cycle 5 FOLFOX 06/16/2022, oxaliplatin dose reduced and 5-FU bolus eliminated due to neutropenia CT 06/25/2022-unchanged soft tissue at the hepatic hilum, unchanged, bile duct stent, decreased size of peritoneal and omental nodules and diminished volume of likely fluid in the lower left pelvis Cycle 6 FOLFOX 06/30/2022 Cycle 7 FOLFOX 07/21/2022 CT 07/24/2022 (in the emergency department with  nausea, headache, abdominal pain)-no acute pathology.  No significant interval change in omental nodularity. Cycle 8 FOLFOX 08/04/2022, pruritus on the palms at the end of the oxaliplatin infusion, Pepcid and Benadryl given, symptoms resolved, infusion not resumed Cycle 9 FOLFOX 08/18/2022-Pepcid and Benadryl added to premedications, Oxaliplatin diluted in a larger volume and infusion time increased Cycle 10 FOLFOX 09/08/2022 Cycle 11 FOLFOX 09/22/2022 10/06/2022 patient declined treatment CTs 10/15/2022-similar soft tissue fullness in the hepatic hilum with biliary stents in place, decrease in omental/peritoneal metastases Maintenance  capecitabine 10/26/2022 CT abdomen/pelvis 11/19/2022-mild increase in tumor studding at the left paracolic gutter and omentum, circumferential wall thickening of the rectum, biliary stents in place with minimal increase in intrahepatic biliary dilation, nonobstructive renal calculi Colonoscopy 11/23/2022-anastomosis at 15 cm from the anal verge-patent, 2 cm polypoid tissue at the anastomosis biopsied, invasive adenocarcinoma moderately differentiated, preserved expression of major MMR proteins CTs 12/15/2022: Possible developing nodule in the lateral left liver, increased omental nodularity, persistent wall thickening in the upper rectum   Hypertension G4 P3, twins Kidney stones-bilateral staghorn renal calculi on CT 08/10/2020 Infected Port-A-Cath 09/12/2019-placed on Augmentin, referred for Port-A-Cath removal; Port-A-Cath removed 09/14/2019; PICC line placed 09/14/2019; culture staph aureus.  Course of Septra completed. Neutropenia secondary to chemotherapy-Udenyca added with cycle 8 FOLFIRI Right nephrostomy tube 09/12/2020; stent placed 10/09/2020, percutaneous nephrostolithotomy treatment of right-sided kidney stones 10/09/2020, malpositioned right ureter stent replacement 10/23/2020, right ureter stent removed 10/29/2020 Percutaneous left nephrostolithotomy 10/01/2021-no renal  stone identified, left ureter stent left in place Cystoscopy 10/14/2021-stent removed 8.  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  Admission 04/16/2022 with nausea/vomiting and abdominal pain, felt to be acute toxicity related to chemotherapy, discharged home 04/18/2022 10.  Admission 11/19/2022 with nausea/vomiting and abdominal pain-improved 11.  Admission 12/15/2022 with fever and right submandibular swelling/tenderness CT neck 12/15/2022: 2 mm distal right submandibular duct calculus with mild inflammatory change adjacent to the right submandibular gland  April [name] has metastatic colon cancer.  She is symptomatic with abdominal pain secondary to carcinomatosis.  She is completing a course of antibiotics for a recent right submandibular gland stone/infection.  She takes hydrocodone more frequently.  She will increase the MS Contin to 30 mg in the a.m. and 15 mg at night.  She will continue hydrocodone as needed for breakthrough pain.  She is scheduled for a second opinion appointment with Dr. Rod Mae.  We decided to resume treatment with irinotecan/panitumumab if she is not a clinical trial candidate.  She did not have clear evidence of disease progression while treated with irinotecan/panitumumab based therapy and has not received these agents for the past 1.5 years. Ms. Okolo will receive intravenous fluids at the cancer center today.  She will return for an office visit in 1 week.  Thornton Papas,  MD  12/22/2022  10:01 AM

## 2022-12-22 NOTE — Patient Instructions (Signed)

## 2022-12-22 NOTE — Progress Notes (Unsigned)
Patient seen by Dr. Thornton Papas today  Vitals are within treatment parameters:Yes   Labs are within treatment parameters: Yes   Treatment plan has been signed: Yes   Per physician team, Patient will not be receiving treatment today. 1L of IVF

## 2022-12-24 ENCOUNTER — Other Ambulatory Visit: Payer: Self-pay

## 2022-12-24 ENCOUNTER — Emergency Department (HOSPITAL_COMMUNITY): Payer: 59

## 2022-12-24 ENCOUNTER — Encounter (HOSPITAL_COMMUNITY): Payer: Self-pay | Admitting: Emergency Medicine

## 2022-12-24 ENCOUNTER — Inpatient Hospital Stay (HOSPITAL_COMMUNITY)
Admission: EM | Admit: 2022-12-24 | Discharge: 2022-12-26 | DRG: 872 | Disposition: A | Discharge status: Home / Self Care | Payer: 59 | Attending: Internal Medicine | Admitting: Internal Medicine

## 2022-12-24 DIAGNOSIS — Z823 Family history of stroke: Secondary | ICD-10-CM | POA: Diagnosis not present

## 2022-12-24 DIAGNOSIS — R112 Nausea with vomiting, unspecified: Secondary | ICD-10-CM | POA: Diagnosis not present

## 2022-12-24 DIAGNOSIS — I7121 Aneurysm of the ascending aorta, without rupture: Secondary | ICD-10-CM | POA: Diagnosis present

## 2022-12-24 DIAGNOSIS — B961 Klebsiella pneumoniae [K. pneumoniae] as the cause of diseases classified elsewhere: Secondary | ICD-10-CM | POA: Diagnosis not present

## 2022-12-24 DIAGNOSIS — E869 Volume depletion, unspecified: Secondary | ICD-10-CM | POA: Diagnosis present

## 2022-12-24 DIAGNOSIS — I10 Essential (primary) hypertension: Secondary | ICD-10-CM | POA: Diagnosis present

## 2022-12-24 DIAGNOSIS — Z79891 Long term (current) use of opiate analgesic: Secondary | ICD-10-CM

## 2022-12-24 DIAGNOSIS — Z888 Allergy status to other drugs, medicaments and biological substances status: Secondary | ICD-10-CM

## 2022-12-24 DIAGNOSIS — G893 Neoplasm related pain (acute) (chronic): Secondary | ICD-10-CM | POA: Diagnosis present

## 2022-12-24 DIAGNOSIS — E876 Hypokalemia: Secondary | ICD-10-CM | POA: Diagnosis present

## 2022-12-24 DIAGNOSIS — Z1152 Encounter for screening for COVID-19: Secondary | ICD-10-CM | POA: Diagnosis not present

## 2022-12-24 DIAGNOSIS — A419 Sepsis, unspecified organism: Secondary | ICD-10-CM

## 2022-12-24 DIAGNOSIS — Z7951 Long term (current) use of inhaled steroids: Secondary | ICD-10-CM

## 2022-12-24 DIAGNOSIS — Z87442 Personal history of urinary calculi: Secondary | ICD-10-CM

## 2022-12-24 DIAGNOSIS — R7881 Bacteremia: Secondary | ICD-10-CM | POA: Diagnosis not present

## 2022-12-24 DIAGNOSIS — J439 Emphysema, unspecified: Secondary | ICD-10-CM | POA: Diagnosis present

## 2022-12-24 DIAGNOSIS — Z79899 Other long term (current) drug therapy: Secondary | ICD-10-CM

## 2022-12-24 DIAGNOSIS — Z87891 Personal history of nicotine dependence: Secondary | ICD-10-CM

## 2022-12-24 DIAGNOSIS — R7989 Other specified abnormal findings of blood chemistry: Secondary | ICD-10-CM | POA: Diagnosis not present

## 2022-12-24 DIAGNOSIS — C19 Malignant neoplasm of rectosigmoid junction: Secondary | ICD-10-CM | POA: Diagnosis not present

## 2022-12-24 DIAGNOSIS — Z8249 Family history of ischemic heart disease and other diseases of the circulatory system: Secondary | ICD-10-CM

## 2022-12-24 DIAGNOSIS — A4159 Other Gram-negative sepsis: Secondary | ICD-10-CM | POA: Diagnosis present

## 2022-12-24 DIAGNOSIS — R197 Diarrhea, unspecified: Secondary | ICD-10-CM | POA: Diagnosis not present

## 2022-12-24 DIAGNOSIS — E871 Hypo-osmolality and hyponatremia: Secondary | ICD-10-CM | POA: Diagnosis present

## 2022-12-24 DIAGNOSIS — C189 Malignant neoplasm of colon, unspecified: Secondary | ICD-10-CM | POA: Diagnosis not present

## 2022-12-24 DIAGNOSIS — Z9221 Personal history of antineoplastic chemotherapy: Secondary | ICD-10-CM

## 2022-12-24 DIAGNOSIS — C787 Secondary malignant neoplasm of liver and intrahepatic bile duct: Secondary | ICD-10-CM | POA: Diagnosis present

## 2022-12-24 DIAGNOSIS — T451X5S Adverse effect of antineoplastic and immunosuppressive drugs, sequela: Secondary | ICD-10-CM | POA: Diagnosis not present

## 2022-12-24 DIAGNOSIS — Z9689 Presence of other specified functional implants: Secondary | ICD-10-CM | POA: Diagnosis present

## 2022-12-24 DIAGNOSIS — Z5982 Transportation insecurity: Secondary | ICD-10-CM

## 2022-12-24 DIAGNOSIS — Z833 Family history of diabetes mellitus: Secondary | ICD-10-CM

## 2022-12-24 DIAGNOSIS — G620 Drug-induced polyneuropathy: Secondary | ICD-10-CM | POA: Diagnosis present

## 2022-12-24 DIAGNOSIS — R509 Fever, unspecified: Secondary | ICD-10-CM | POA: Diagnosis not present

## 2022-12-24 DIAGNOSIS — K529 Noninfective gastroenteritis and colitis, unspecified: Secondary | ICD-10-CM

## 2022-12-24 DIAGNOSIS — C187 Malignant neoplasm of sigmoid colon: Secondary | ICD-10-CM | POA: Diagnosis present

## 2022-12-24 HISTORY — DX: Sepsis, unspecified organism: A41.9

## 2022-12-24 LAB — BLOOD CULTURE ID PANEL (REFLEXED) - BCID2

## 2022-12-24 LAB — MAGNESIUM: Magnesium: 1.7 mg/dL (ref 1.7–2.4)

## 2022-12-24 LAB — CBC WITH DIFFERENTIAL/PLATELET
Abs Immature Granulocytes: 0.05 10*3/uL (ref 0.00–0.07)
Basophils Absolute: 0 10*3/uL (ref 0.0–0.1)
Basophils Relative: 0 %
Eosinophils Absolute: 0.2 10*3/uL (ref 0.0–0.5)
Eosinophils Relative: 2 %
HCT: 39.7 % (ref 36.0–46.0)
Hemoglobin: 13.5 g/dL (ref 12.0–15.0)
Immature Granulocytes: 1 %
Lymphocytes Relative: 14 %
Lymphs Abs: 1.5 10*3/uL (ref 0.7–4.0)
MCH: 30.8 pg (ref 26.0–34.0)
MCHC: 34 g/dL (ref 30.0–36.0)
MCV: 90.6 fL (ref 80.0–100.0)
Monocytes Absolute: 0.7 10*3/uL (ref 0.1–1.0)
Monocytes Relative: 7 %
Neutro Abs: 8.1 10*3/uL — ABNORMAL HIGH (ref 1.7–7.7)
Neutrophils Relative %: 76 %
Platelets: 318 10*3/uL (ref 150–400)
RBC: 4.38 MIL/uL (ref 3.87–5.11)
RDW: 13.9 % (ref 11.5–15.5)
WBC: 10.7 10*3/uL — ABNORMAL HIGH (ref 4.0–10.5)
nRBC: 0 % (ref 0.0–0.2)

## 2022-12-24 LAB — URINALYSIS, W/ REFLEX TO CULTURE (INFECTION SUSPECTED)
Bacteria, UA: NONE SEEN
Bilirubin Urine: NEGATIVE
Glucose, UA: NEGATIVE mg/dL
Ketones, ur: NEGATIVE mg/dL
Leukocytes,Ua: NEGATIVE
Nitrite: NEGATIVE
Protein, ur: NEGATIVE mg/dL
Specific Gravity, Urine: >1.046 — ABNORMAL HIGH (ref 1.005–1.030)
pH: 5 (ref 5.0–8.0)

## 2022-12-24 LAB — COMPREHENSIVE METABOLIC PANEL
ALT: 55 U/L — ABNORMAL HIGH (ref 0–44)
AST: 47 U/L — ABNORMAL HIGH (ref 15–41)
Albumin: 3.8 g/dL (ref 3.5–5.0)
Alkaline Phosphatase: 354 U/L — ABNORMAL HIGH (ref 38–126)
Anion gap: 13 (ref 5–15)
BUN: 8 mg/dL (ref 6–20)
CO2: 26 mmol/L (ref 22–32)
Calcium: 9.3 mg/dL (ref 8.9–10.3)
Chloride: 93 mmol/L — ABNORMAL LOW (ref 98–111)
Creatinine, Ser: 0.49 mg/dL (ref 0.44–1.00)
GFR, Estimated: >60 mL/min (ref >60)
Glucose, Bld: 109 mg/dL — ABNORMAL HIGH (ref 70–99)
Potassium: 2.7 mmol/L — CL (ref 3.5–5.1)
Sodium: 132 mmol/L — ABNORMAL LOW (ref 135–145)
Total Bilirubin: 0.5 mg/dL (ref <1.2)
Total Protein: 8.4 g/dL — ABNORMAL HIGH (ref 6.5–8.1)

## 2022-12-24 LAB — I-STAT CG4 LACTIC ACID, ED
Lactic Acid, Venous: 0.6 mmol/L (ref 0.5–1.9)
Lactic Acid, Venous: 1 mmol/L (ref 0.5–1.9)

## 2022-12-24 LAB — PROTIME-INR
INR: 1 (ref 0.8–1.2)
Prothrombin Time: 13.3 s (ref 11.4–15.2)

## 2022-12-24 LAB — RESP PANEL BY RT-PCR (RSV, FLU A&B, COVID)  RVPGX2
Influenza A by PCR: NEGATIVE
Influenza B by PCR: NEGATIVE
Resp Syncytial Virus by PCR: NEGATIVE
SARS Coronavirus 2 by RT PCR: NEGATIVE

## 2022-12-24 LAB — BASIC METABOLIC PANEL
Anion gap: 10 (ref 5–15)
BUN: 8 mg/dL (ref 6–20)
CO2: 27 mmol/L (ref 22–32)
Calcium: 8.6 mg/dL — ABNORMAL LOW (ref 8.9–10.3)
Chloride: 97 mmol/L — ABNORMAL LOW (ref 98–111)
Creatinine, Ser: 0.43 mg/dL — ABNORMAL LOW (ref 0.44–1.00)
GFR, Estimated: >60 mL/min (ref >60)
Glucose, Bld: 98 mg/dL (ref 70–99)
Potassium: 2.9 mmol/L — ABNORMAL LOW (ref 3.5–5.1)
Sodium: 134 mmol/L — ABNORMAL LOW (ref 135–145)

## 2022-12-24 LAB — APTT: aPTT: 31 s (ref 24–36)

## 2022-12-24 LAB — PHOSPHORUS: Phosphorus: 3.1 mg/dL (ref 2.5–4.6)

## 2022-12-24 MED ORDER — VANCOMYCIN HCL 750 MG/150ML IV SOLN
750.0000 mg | Freq: Two times a day (BID) | INTRAVENOUS | Status: DC
  Administered 2022-12-24: 750 mg via INTRAVENOUS
  Filled 2022-12-24: qty 150

## 2022-12-24 MED ORDER — HYDROMORPHONE HCL 1 MG/ML IJ SOLN
0.5000 mg | INTRAMUSCULAR | Status: DC | PRN
  Administered 2022-12-24: 0.5 mg via INTRAVENOUS
  Filled 2022-12-24: qty 0.5

## 2022-12-24 MED ORDER — SODIUM CHLORIDE 0.9 % IV SOLN
2.0000 g | Freq: Once | INTRAVENOUS | Status: AC
Start: 2022-12-24 — End: 2022-12-24
  Administered 2022-12-24: 2 g via INTRAVENOUS
  Filled 2022-12-24: qty 12.5

## 2022-12-24 MED ORDER — KETOROLAC TROMETHAMINE 15 MG/ML IJ SOLN
15.0000 mg | Freq: Once | INTRAMUSCULAR | Status: AC
  Administered 2022-12-24: 15 mg via INTRAVENOUS
  Filled 2022-12-24: qty 1

## 2022-12-24 MED ORDER — SODIUM CHLORIDE 0.9% FLUSH
10.0000 mL | Freq: Two times a day (BID) | INTRAVENOUS | Status: DC
  Administered 2022-12-24: 20 mL
  Administered 2022-12-26: 10 mL

## 2022-12-24 MED ORDER — METRONIDAZOLE 500 MG/100ML IV SOLN
500.0000 mg | Freq: Once | INTRAVENOUS | Status: AC
  Administered 2022-12-24: 500 mg via INTRAVENOUS
  Filled 2022-12-24: qty 100

## 2022-12-24 MED ORDER — IOHEXOL 300 MG/ML  SOLN
100.0000 mL | Freq: Once | INTRAMUSCULAR | Status: AC | PRN
  Administered 2022-12-24: 100 mL via INTRAVENOUS

## 2022-12-24 MED ORDER — SODIUM CHLORIDE 0.9 % IV SOLN
INTRAVENOUS | Status: AC
Start: 2022-12-24 — End: 2022-12-24

## 2022-12-24 MED ORDER — SODIUM CHLORIDE 0.9 % IV SOLN
2.0000 g | Freq: Three times a day (TID) | INTRAVENOUS | Status: DC
  Administered 2022-12-24: 2 g via INTRAVENOUS
  Filled 2022-12-24: qty 12.5

## 2022-12-24 MED ORDER — SODIUM CHLORIDE 0.9 % IV SOLN
2.0000 g | INTRAVENOUS | Status: DC
Start: 2022-12-24 — End: 2022-12-26
  Administered 2022-12-24 – 2022-12-25 (×2): 2 g via INTRAVENOUS
  Filled 2022-12-24 (×2): qty 20

## 2022-12-24 MED ORDER — VANCOMYCIN HCL IN DEXTROSE 1-5 GM/200ML-% IV SOLN
1000.0000 mg | Freq: Once | INTRAVENOUS | Status: AC
  Administered 2022-12-24: 1000 mg via INTRAVENOUS
  Filled 2022-12-24: qty 200

## 2022-12-24 MED ORDER — CHLORHEXIDINE GLUCONATE CLOTH 2 % EX PADS
6.0000 | MEDICATED_PAD | Freq: Every day | CUTANEOUS | Status: DC
  Administered 2022-12-24 – 2022-12-26 (×3): 6 via TOPICAL

## 2022-12-24 MED ORDER — PROCHLORPERAZINE EDISYLATE 10 MG/2ML IJ SOLN
10.0000 mg | Freq: Four times a day (QID) | INTRAMUSCULAR | Status: DC | PRN
  Administered 2022-12-25: 10 mg via INTRAVENOUS
  Filled 2022-12-24: qty 2

## 2022-12-24 MED ORDER — PROCHLORPERAZINE EDISYLATE 10 MG/2ML IJ SOLN
10.0000 mg | Freq: Once | INTRAMUSCULAR | Status: AC
  Administered 2022-12-24: 10 mg via INTRAVENOUS
  Filled 2022-12-24: qty 2

## 2022-12-24 MED ORDER — POTASSIUM CHLORIDE 10 MEQ/100ML IV SOLN
10.0000 meq | INTRAVENOUS | Status: AC
  Administered 2022-12-24 (×6): 10 meq via INTRAVENOUS
  Filled 2022-12-24 (×6): qty 100

## 2022-12-24 MED ORDER — MAGNESIUM SULFATE 2 GM/50ML IV SOLN
2.0000 g | Freq: Once | INTRAVENOUS | Status: AC
  Administered 2022-12-24: 2 g via INTRAVENOUS
  Filled 2022-12-24: qty 50

## 2022-12-24 MED ORDER — ONDANSETRON HCL 4 MG/2ML IJ SOLN
4.0000 mg | Freq: Once | INTRAMUSCULAR | Status: AC
  Administered 2022-12-24: 4 mg via INTRAVENOUS
  Filled 2022-12-24: qty 2

## 2022-12-24 MED ORDER — POTASSIUM CHLORIDE CRYS ER 20 MEQ PO TBCR
40.0000 meq | EXTENDED_RELEASE_TABLET | Freq: Once | ORAL | Status: AC
  Administered 2022-12-24: 40 meq via ORAL
  Filled 2022-12-24: qty 2

## 2022-12-24 MED ORDER — ACETAMINOPHEN 500 MG PO TABS
1000.0000 mg | ORAL_TABLET | Freq: Once | ORAL | Status: AC
  Administered 2022-12-24: 1000 mg via ORAL
  Filled 2022-12-24: qty 2

## 2022-12-24 MED ORDER — POTASSIUM CHLORIDE 10 MEQ/100ML IV SOLN
10.0000 meq | INTRAVENOUS | Status: AC
  Administered 2022-12-24 (×2): 10 meq via INTRAVENOUS
  Filled 2022-12-24 (×2): qty 100

## 2022-12-24 MED ORDER — SODIUM CHLORIDE 0.9 % IV BOLUS
1000.0000 mL | Freq: Once | INTRAVENOUS | Status: AC
  Administered 2022-12-24: 1000 mL via INTRAVENOUS

## 2022-12-24 MED ORDER — HYDROMORPHONE HCL 1 MG/ML IJ SOLN
1.0000 mg | INTRAMUSCULAR | Status: DC | PRN
  Administered 2022-12-24 – 2022-12-26 (×10): 1 mg via INTRAVENOUS
  Filled 2022-12-24 (×10): qty 1

## 2022-12-24 MED ORDER — ENOXAPARIN SODIUM 40 MG/0.4ML IJ SOSY
40.0000 mg | PREFILLED_SYRINGE | INTRAMUSCULAR | Status: DC
  Administered 2022-12-24 – 2022-12-25 (×2): 40 mg via SUBCUTANEOUS
  Filled 2022-12-24 (×2): qty 0.4

## 2022-12-24 MED ORDER — HYDROMORPHONE HCL 1 MG/ML IJ SOLN
1.0000 mg | INTRAMUSCULAR | Status: AC | PRN
  Administered 2022-12-24 (×3): 1 mg via INTRAVENOUS
  Filled 2022-12-24 (×3): qty 1

## 2022-12-24 MED ORDER — SODIUM CHLORIDE 0.9% FLUSH: 10.0000 mL | INTRAVENOUS | Status: DC | PRN

## 2022-12-24 MED ORDER — METRONIDAZOLE 500 MG/100ML IV SOLN: 500.0000 mg | Freq: Two times a day (BID) | INTRAVENOUS | Status: DC

## 2022-12-24 NOTE — Progress Notes (Signed)
Pharmacy Antibiotic Note  April [name] is a 47 y.o. female admitted on 12/24/2022 with sepsis.  Pharmacy has been consulted for vancomycin & cefepime dosing. Tmax 102.2, WBC 10.7 SCr 0.43   Plan: Vancomycin 1 gm given in ED @ 0146 am Vancomycin 750 mg IV q12h for eAUC 495.7, SCr rounded up to 0.8, Vd 0.72 Cefepime 2 gm given in ED @ 0252 Cefepime 2 gm IV q8h Flagyl 500 mg IV q12 x 7 days per MD  Height: 5\' 4"  (162.6 cm) Weight: 64.3 kg (141 lb 11.2 oz) IBW/kg (Calculated) : 54.7  Temp (24hrs), Avg:99.3 F (37.4 C), Min:97.6 F (36.4 C), Max:102.2 F (39 C)  Recent Labs  Lab 12/22/22 0821 12/24/22 0049 12/24/22 0100 12/24/22 0259 12/24/22 0803  WBC 8.5 10.7*  --   --   --   CREATININE 0.63 0.49  --   --  0.43*  LATICACIDVEN  --   --  0.6 1.0  --     Estimated Creatinine Clearance: 73.5 mL/min (A) (by C-G formula based on SCr of 0.43 mg/dL (L)).    Allergies  Allergen Reactions   Lisinopril Swelling and Other (See Comments)    Lip swelling and Angioedema   Irinotecan Other (See Comments)    Stomach cramping Patient given 0.5 mL atropine IV and was able to continue infusion.  See Progress note on 10/03/20.    Leucovorin Calcium Itching    Patient reported itchy palms towards end of transfusion, see progress note 08/04/22.    Oxaliplatin Itching    Patient reported itchy palms towards end of transfusion, see progress note on 08/04/22.    Antimicrobials this admission: 12/11 cefepime >> 12/11 vanco >> 12/11 flagyl >> Dose adjustments this admission:  Microbiology results: 12/11 BCx2:   Thank you for allowing pharmacy to be a part of this patient's care.  Herby Abraham, Pharm.D Use secure chat for questions 12/24/2022 10:04 AM

## 2022-12-24 NOTE — ED Provider Notes (Addendum)
[place] EMERGENCY DEPARTMENT AT [facility] Provider Note  CSN: [id number] Arrival date & time: 12/24/22 0006  Chief Complaint(s) Abdominal Pain  HPI April [name] is a 47 y.o. female with a past medical history listed below including colorectal cancer who is supposed to start chemotherapy earlier this week but treatment was denied by [place] [facility].  Patient was recently admitted to the hospital for nausea vomiting related to her cancer and noted to have sialoadenitis and was discharged with Augmentin.  Patient reports that she has had watery diarrhea over the past couple days.  Also endorses nausea and vomiting.  Patient is reporting generalized malaise with subjective fevers.  The history is provided by the patient.    Past Medical History Past Medical History:  Diagnosis Date   Benign essential hypertension 10/06/2017   Last Assessment & Plan:  Formatting of this note might be different from the original. Patient's blood pressure noted to be elevated at today's visit 188/98.  Patient will be provided with antihypertensives in the treatment center if needed in order to meet her Avastin parameters.  Patient recommended to follow up with her primary care physician regarding medication management.  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   Sialoadenitis of submandibular gland 12/16/2022   Ascending aortic aneurysm (HCC) 12/16/2022   Rectal polyp 11/23/2022   Diverticulosis of colon without hemorrhage 11/23/2022   Abnormal CT of the abdomen 11/21/2022   Cancer related pain 11/19/2022   Acute nonintractable headache 08/11/2022   Nausea and vomiting 07/24/2022   History of colon cancer 06/28/2022   History of ERCP 06/28/2022   Change in bowel habits 06/28/2022   Former smoker 04/30/2022    Intractable nausea and vomiting 04/16/2022   Protein-calorie malnutrition, severe 03/26/2022   Chronic constipation 03/26/2022   Metastatic colon cancer to liver (HCC) 03/24/2022   Obstructive jaundice 03/24/2022   Biliary stricture 03/24/2022   Biliary obstruction 03/23/2022   Hyperbilirubinemia 03/21/2022   Abnormal LFTs 03/21/2022   Nephrolithiasis 03/21/2022   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   Renal calculi    Hydronephrosis of right kidney    Colorectal cancer, stage IV (HCC) 10/15/2020   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 Authorizing Provider  amLODipine (NORVASC) 10 MG tablet Take 1 tablet (10 mg total) by mouth daily. 11/24/22   Rodolph Bong, MD  chlorthalidone (HYGROTON) 25 MG tablet Take 1 tablet (25 mg total) by mouth daily. 11/24/22   Rodolph Bong, MD  dicyclomine (BENTYL) 20 MG tablet Take 1 tablet (20 mg total) by mouth 3 (three) times daily as needed for spasms (rectal/AB pain). 11/24/22   Rodolph Bong, MD  HYDROcodone-acetaminophen (NORCO) 10-325 MG tablet Take 1 tablet by mouth every 6 (six) hours as needed. 12/22/22   Sherrill,  Leighton Roach, MD  metoprolol tartrate (LOPRESSOR) 50 MG tablet Take 1 tablet (50 mg total) by mouth 2 (two) times daily. 11/24/22   Rodolph Bong, MD  mometasone-formoterol (DULERA) 200-5 MCG/ACT AERO Inhale 2 puffs into the lungs 2 (two) times daily. 11/24/22   Rodolph Bong, MD  morphine (MS CONTIN) 15  MG 12 hr tablet Take 1 tablet (15 mg total) by mouth every 12 (twelve) hours. 12/03/22   Rana Snare, NP  polyethylene glycol powder (MIRALAX) 17 GM/SCOOP powder Take 17 g by mouth 2 (two) times daily. 11/24/22   Rodolph Bong, MD  potassium chloride (MICRO-K) 10 MEQ CR capsule Take 2 capsules (20 mEq total) by mouth 2 (two) times daily. 11/24/22   Rodolph Bong, MD  prochlorperazine (COMPAZINE) 10 MG tablet Take 1 tablet (10 mg total) by mouth every 6 (six) hours as needed for nausea or vomiting. Patient not taking: Reported on 12/22/2022 04/28/22   Rana Snare, NP  senna-docusate (SENOKOT-S) 8.6-50 MG tablet Take 1 tablet by mouth 2 (two) times daily. Patient not taking: Reported on 12/22/2022 11/24/22   Rodolph Bong, MD  umeclidinium bromide (INCRUSE ELLIPTA) 62.5 MCG/ACT AEPB Inhale 1 puff into the lungs daily. 11/25/22   Rodolph Bong, MD                                                                                                                                    Allergies Lisinopril, Irinotecan, Leucovorin calcium, and Oxaliplatin  Review of Systems Review of Systems As noted in HPI  Physical Exam Vital Signs  I have reviewed the triage vital signs BP (!) 139/94 (BP Location: Right Arm)   Pulse (!) 104   Temp 98.5 F (36.9 C) (Oral)   Resp 17   Ht 5\' 4"  (1.626 m)   Wt 64.3 kg   LMP 01/07/2006   SpO2 97%   BMI 24.32 kg/m   Physical Exam Vitals reviewed.  Constitutional:      General: She is not in acute distress.    Appearance: She is well-developed. She is not diaphoretic.  HENT:     Head: Normocephalic and atraumatic.     Nose: Nose normal.  Eyes:     General: No scleral icterus.       Right eye: No discharge.        Left eye: No discharge.     Conjunctiva/sclera: Conjunctivae normal.     Pupils: Pupils are equal, round, and reactive to light.  Cardiovascular:     Rate and Rhythm: Regular rhythm. Tachycardia present.     Heart sounds:  No murmur heard.    No friction rub. No gallop.  Pulmonary:     Effort: Pulmonary effort is normal. No respiratory distress.     Breath sounds: Normal breath sounds. No stridor. No rales.  Abdominal:     General: There is no distension.  Palpations: Abdomen is soft.     Tenderness: There is abdominal tenderness in the epigastric area, periumbilical area and left upper quadrant.  Musculoskeletal:        General: No tenderness.       Arms:     Cervical back: Normal range of motion and neck supple.  Skin:    General: Skin is warm and dry.     Findings: No erythema or rash.  Neurological:     Mental Status: She is alert and oriented to person, place, and time.     ED Results and Treatments Labs (all labs ordered are listed, but only abnormal results are displayed) Labs Reviewed  COMPREHENSIVE METABOLIC PANEL - Abnormal; Notable for the following components:      Result Value   Sodium 132 (*)    Potassium 2.7 (*)    Chloride 93 (*)    Glucose, Bld 109 (*)    Total Protein 8.4 (*)    AST 47 (*)    ALT 55 (*)    Alkaline Phosphatase 354 (*)    All other components within normal limits  CBC WITH DIFFERENTIAL/PLATELET - Abnormal; Notable for the following components:   WBC 10.7 (*)    Neutro Abs 8.1 (*)    All other components within normal limits  URINALYSIS, W/ REFLEX TO CULTURE (INFECTION SUSPECTED) - Abnormal; Notable for the following components:   Specific Gravity, Urine >1.046 (*)    Hgb urine dipstick SMALL (*)    All other components within normal limits  RESP PANEL BY RT-PCR (RSV, FLU A&B, COVID)  RVPGX2  CULTURE, BLOOD (ROUTINE X 2)  CULTURE, BLOOD (ROUTINE X 2)  C DIFFICILE QUICK SCREEN W PCR REFLEX    CULTURE, BLOOD (SINGLE)  PROTIME-INR  APTT  I-STAT CG4 LACTIC ACID, ED  I-STAT CG4 LACTIC ACID, ED                                                                                                                         EKG  EKG  Interpretation Date/Time:  Wednesday December 24 2022 01:55:44 EST Ventricular Rate:  113 PR Interval:  132 QRS Duration:  104 QT Interval:  368 QTC Calculation: 505 R Axis:   62  Text Interpretation: Sinus tachycardia Atrial premature complex Biatrial enlargement Left ventricular hypertrophy Borderline prolonged QT interval Confirmed by Drema Pry [phone]) on 12/24/2022 5:54:26 AM       Radiology CT ABDOMEN PELVIS W CONTRAST  Result Date: 12/24/2022 CLINICAL DATA:  47 year old with metastatic colon cancer presents with abdominal pain for 3 days. History of pancreatic stent. EXAM: CT ABDOMEN AND PELVIS WITH CONTRAST TECHNIQUE: Multidetector CT imaging of the abdomen and pelvis was performed using the standard protocol following bolus administration of intravenous contrast. RADIATION DOSE REDUCTION: This exam was performed according to the departmental dose-optimization program which includes automated exposure control, adjustment of the mA and/or kV according to patient size and/or use of iterative reconstruction technique. CONTRAST:  OMNIPAQUE  IOHEXOL 300 MG/ML  SOLN COMPARISON:  CTs with IV contrast 12/15/2022 and 11/19/2022. FINDINGS: Lower chest: No acute abnormality. Hepatobiliary: Stenting again noted of the left and right hepatic ducts extending down the common bile duct into the duodenum terminating in the horizontal duodenum to the left. Intrahepatic biliary dilatation, greater in the left lobe is similar to the last CT but increased since 11/19/2022. The greatest dilatation is in the central bile ducts which demonstrate wall thickening near the hepatic hilum which could be related to cholangitis or infiltrating disease. This was present on both prior studies. Hepatic parenchymal enhancement is more homogeneous than on the last study possible bolus phase related. Major hepatic vascular structures are patent. There is moderately dense material in gallbladder consistent with  vicarious contrast excretion or dense sludge. No stones, wall thickening or pericholecystic fluid are seen. There is a 2.1 cm heterogeneous mass in the lateral aspect of left hepatic segment 2, again most likely representing a metastasis and not seen on 11/19/2022. Focal periligamentous fat is seen in segment 4 B. in segment 5 there is a potentially new 5 mm hypodensity on 2:24 which could be a small metastasis. In segment 8 there is a 4 mm chronic cyst on 2:14. No other visible focal liver abnormality. Pancreas: Slightly dilated proximal pancreatic duct 4 mm, unchanged. No further abnormality. Spleen: No abnormality. Adrenals/Urinary Tract: There is no adrenal mass, no renal mass enhancement. 1.9 cm Bosniak 1 cyst in the posterior left kidney measures 11 Hounsfield units. There is bilateral nephrolithiasis. Largest stone on the left is in the upper pole measuring 1.1 cm, largest on the right is in the lower pole and measures 8 mm. There is cortical scarring in the right lower pole but no mass enhancement of either kidney. No obstructing stones or hydronephrosis.  Unremarkable bladder. Stomach/Bowel: There are thickened folds in the stomach. Thickened folds in the distal duodenum and jejunum consistent with gastroenteritis. No small bowel dilatation or inflammatory changes seen. The appendix is normal. There are increasingly thickened folds in the transverse colon. Given the short interval timeframe of the appearance of this, inflammatory or infectious etiology such as colitis is more likely than infiltrating disease. There is moderate thickening in the presacral rectum again noted consistent with neoplasm or radiation changes. There is mild-to-moderate fecal stasis in the remaining large bowel. Vascular/Lymphatic: Aortic atherosclerosis. No enlarged abdominal or pelvic lymph nodes. Reproductive: Uterus and bilateral adnexa are unremarkable. Other: Multiple peritoneal/omental implants. Examples include a solid  nodule in mid left paracolic gutter measuring 1.7 x 1.4 cm on 2:38, scattered smaller nodules distal to this in the more distal gutter, omental implant in the anterior right lower abdomen measuring 1.7 x 1.3 cm on 2:49, and an omental nodule right of the midline in the mid abdomen measuring 1.5 x 1.7 on 2: 42. These omental implants are not notably changed since 9 days ago but are slightly larger than on 11/19/2022. There is a small volume of pelvic ascites the left. There is no free air. 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 No bone metastasis is seen. IMPRESSION: 1. Increasingly thickened folds in the transverse colon, more likely to be infectious or inflammatory than neoplastic. 2. Gastroenteritis. 3. 2.1 cm heterogeneous mass in the lateral aspect of left hepatic segment 2, most likely a metastasis and not seen on 11/19/2022. 4. Potentially new 5 mm hypodensity in  segment 5 which could be a small metastasis. 5. Stable biliary stents with intrahepatic biliary dilatation, greater in the left lobe, similar to the last CT but increased since 11/19/2022. 6. Central biliary wall thickening near the hepatic hilum which could be related to cholangitis or infiltrating disease. 7. Multiple peritoneal/omental implants, not notably changed since 9 days ago but slightly larger than on 11/19/2022. 8. Small volume of pelvic ascites. 9. Constipation. 10. Aortic atherosclerosis. 11. Bilateral nephrolithiasis. 12. 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 Aortic Atherosclerosis (ICD10-I70.0). Electronically Signed   By: Almira Bar M.D.   On: 12/24/2022 04:56   DG Chest Port 1 View  Result Date: 12/24/2022 CLINICAL DATA:  Colon cancer, nausea, diarrhea, possible sepsis EXAM: PORTABLE CHEST  1 VIEW COMPARISON:  CT chest dated 12/15/2022 FINDINGS: Lungs are clear.  No pleural effusion or pneumothorax. The heart is normal in size. IMPRESSION: No acute cardiopulmonary disease. Electronically Signed   By: Charline Bills M.D.   On: 12/24/2022 01:09    Medications Ordered in ED Medications  0.9 %  sodium chloride infusion ( Intravenous New Bag/Given 12/24/22 0142)  HYDROmorphone (DILAUDID) injection 1 mg (1 mg Intravenous Given 12/24/22 0619)  HYDROmorphone (DILAUDID) injection 0.5 mg (has no administration in time range)  sodium chloride 0.9 % bolus 1,000 mL (0 mLs Intravenous Stopped 12/24/22 0240)  ceFEPIme (MAXIPIME) 2 g in sodium chloride 0.9 % 100 mL IVPB (0 g Intravenous Stopped 12/24/22 0342)  metroNIDAZOLE (FLAGYL) IVPB 500 mg (0 mg Intravenous Stopped 12/24/22 0532)  vancomycin (VANCOCIN) IVPB 1000 mg/200 mL premix (0 mg Intravenous Stopped 12/24/22 0250)  acetaminophen (TYLENOL) tablet 1,000 mg (1,000 mg Oral Given 12/24/22 0116)  prochlorperazine (COMPAZINE) injection 10 mg (10 mg Intravenous Given 12/24/22 0116)  potassium chloride 10 mEq in 100 mL IVPB (0 mEq Intravenous Stopped 12/24/22 0532)  iohexol (OMNIPAQUE) 300 MG/ML solution 100 mL (100 mLs Intravenous Contrast Given 12/24/22 0346)  ondansetron (ZOFRAN) injection 4 mg (4 mg Intravenous Given 12/24/22 0635)   Procedures .Critical Care  Performed by: Nira Conn, MD Authorized by: Nira Conn, MD   Critical care provider statement:    Critical care time (minutes):  56   Critical care time was exclusive of:  Separately billable procedures and treating other patients   Critical care was necessary to treat or prevent imminent or life-threatening deterioration of the following conditions:  Sepsis   Critical care was time spent personally by me on the following activities:  Development of treatment plan with patient or surrogate, discussions with consultants, evaluation of patient's response to  treatment, examination of patient, obtaining history from patient or surrogate, review of old charts, re-evaluation of patient's condition, pulse oximetry, ordering and review of radiographic studies, ordering and review of laboratory studies and ordering and performing treatments and interventions   Care discussed with: admitting provider     (including critical care time) Medical Decision Making / ED Course   Medical Decision Making Amount and/or Complexity of Data Reviewed Labs: ordered. Decision-making details documented in ED Course. Radiology: ordered and independent interpretation performed. Decision-making details documented in ED Course. ECG/medicine tests: ordered and independent interpretation performed. Decision-making details documented in ED Course.  Risk OTC drugs. Prescription drug management. Decision regarding hospitalization.    Differential diagnosis and workup listed below  Code sepsis initiated patient started on empiric antibiotics.  Concern for intra-abdominal inflammatory/infectious processes.  Also considering C. difficile and will test for  it.  Will also draw cultures from the PICC line to rule out catheter related infection.  Workup was notable for colitis of the transverse colon as well as gastroenteritis.  Patient has progression of neoplastic process.  Labs also notable for hypokalemia 2.7, repleted IV. CBC with mild leukocytosis.  No anemia.  Lactic acid normal. UA without evidence of infection Viral panel negative for COVID, influenza, RSV Chest x-ray without of pneumonia, pneumothorax, pulmonary edema pleural effusions. C.diff pending  Patient admitted to hospitalist service for further workup and management     Final Clinical Impression(s) / ED Diagnoses Final diagnoses:  Sepsis without acute organ dysfunction, due to unspecified organism [facility])  Colitis    This chart was dictated using voice recognition software.  Despite best efforts to  proofread,  errors can occur which can change the documentation meaning.      Nira Conn, MD 12/24/22 [phone]

## 2022-12-24 NOTE — Progress Notes (Signed)
PHARMACY - PHYSICIAN COMMUNICATION CRITICAL VALUE ALERT - BLOOD CULTURE IDENTIFICATION (BCID)  April [name] is an 47 y.o. female who presented to [facility] on 12/24/2022 with a chief complaint of abdominal pain started on abx's for sepsis  Assessment:  2/6 bottles with GNR - kleb pneumo with no resistance per BCID  Name of physician (or Provider) Contacted: Robb Matar  Current antibiotics: vanc/cefepime/flagyl  Changes to prescribed antibiotics recommended:  Narrow to Rocephin 2g IV q24 only  Results for orders placed or performed during the hospital encounter of 12/24/22  Blood Culture ID Panel (Reflexed) (Collected: 12/24/2022  1:35 AM)  Result Value Ref Range   Enterococcus faecalis NOT DETECTED NOT DETECTED   Enterococcus Faecium NOT DETECTED NOT DETECTED   Listeria monocytogenes NOT DETECTED NOT DETECTED   Staphylococcus species NOT DETECTED NOT DETECTED   Staphylococcus aureus (BCID) NOT DETECTED NOT DETECTED   Staphylococcus epidermidis NOT DETECTED NOT DETECTED   Staphylococcus lugdunensis NOT DETECTED NOT DETECTED   Streptococcus species NOT DETECTED NOT DETECTED   Streptococcus agalactiae NOT DETECTED NOT DETECTED   Streptococcus pneumoniae NOT DETECTED NOT DETECTED   Streptococcus pyogenes NOT DETECTED NOT DETECTED   A.calcoaceticus-baumannii NOT DETECTED NOT DETECTED   Bacteroides fragilis NOT DETECTED NOT DETECTED   Enterobacterales DETECTED (A) NOT DETECTED   Enterobacter cloacae complex NOT DETECTED NOT DETECTED   Escherichia coli NOT DETECTED NOT DETECTED   Klebsiella aerogenes NOT DETECTED NOT DETECTED   Klebsiella oxytoca NOT DETECTED NOT DETECTED   Klebsiella pneumoniae DETECTED (A) NOT DETECTED   Proteus species NOT DETECTED NOT DETECTED   Salmonella species NOT DETECTED NOT DETECTED   Serratia marcescens NOT DETECTED NOT DETECTED   Haemophilus influenzae NOT DETECTED NOT DETECTED   Neisseria meningitidis NOT DETECTED NOT DETECTED   Pseudomonas  aeruginosa NOT DETECTED NOT DETECTED   Stenotrophomonas maltophilia NOT DETECTED NOT DETECTED   Candida albicans NOT DETECTED NOT DETECTED   Candida auris NOT DETECTED NOT DETECTED   Candida glabrata NOT DETECTED NOT DETECTED   Candida krusei NOT DETECTED NOT DETECTED   Candida parapsilosis NOT DETECTED NOT DETECTED   Candida tropicalis NOT DETECTED NOT DETECTED   Cryptococcus neoformans/gattii NOT DETECTED NOT DETECTED   CTX-M ESBL NOT DETECTED NOT DETECTED   Carbapenem resistance IMP NOT DETECTED NOT DETECTED   Carbapenem resistance KPC NOT DETECTED NOT DETECTED   Carbapenem resistance NDM NOT DETECTED NOT DETECTED   Carbapenem resist OXA 48 LIKE NOT DETECTED NOT DETECTED   Carbapenem resistance VIM NOT DETECTED NOT DETECTED    [name] 12/24/2022  5:56 PM

## 2022-12-24 NOTE — H&P (Signed)
History and Physical    Patient: April [name] EXB:[id number] DOB: [birth year] DOA: 12/24/2022 DOS: the patient was seen and examined on 12/24/2022 PCP: Tollie Eth, NP  Patient coming from: Home  Chief Complaint:  Chief Complaint  Patient presents with   Abdominal Pain   HPI: April [name] is a 47 y.o. female with medical history significant of hypertension, nephrolithiasis, history of colon cancer who presented to the emergency department with complaints of progressively worse abdominal pain, nausea, vomiting, diarrhea for the past 3 days and fever on arrival to the emergency department.  She stated she ate shrimp and fish prior to the symptoms, but nobody else who ate the same developed symptoms.   No constipation, melena or hematochezia.  No flank pain, dysuria, frequency or hematuria.  Denied rhinorrhea, sore throat, wheezing or hemoptysis.  No chest pain, palpitations, diaphoresis, PND, orthopnea or pitting edema of the lower extremities. No polyuria, polydipsia, polyphagia or blurred vision.   Lab work: CBC showed a white count of 10.7 with 76% neutrophils, hemoglobin 13.5 g/dL platelets 102.  Normal PT, INR and PTT, normal lactic acid x 2.  Negative coronavirus, influenza and RSV PCR.  CMP showed a sodium 132, potassium 2.7, chloride 93 and CO2 26 mmol/L.  Glucose 109 mg/dL, normal renal function, bilirubin and calcium level.  Total protein 8.4 and albumin 3.8 g/dL.  AST 47, ALT 55 alkaline phosphatase 354 units/L.  Imaging: Portable 1 view chest radiograph with no acute cardiopulmonary disease.  CT CT abdomen/pelvis with contrast showing increase in intake and falls in the transverse colon, more likely to be infectious or inflammatory in the neoplastic.  Gastroenteritis.  2.1 cm heterogenic mass in the lateral aspect of the left hepatic segment 2, most likely metastasis and not seen on 11/19/2022.  Potentially new 5 mm hypodensity in segment 5 which could be a small metastasis.   Stable biliary stents with intrahepatic biliary dilatation, greater in the left lobe, similar to the last CT, but increased since 11/19/2022.  Central biliary wall thickening near the hepatic hilum.  Multiple peritoneal/omental implants that are unchanged.  Small volume pelvic ascites.  Constipation.  Aortic atherosclerosis, bilateral nephrolithiasis.  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  ED course: Initial vital signs were temperature 102.2 F, pulse 134, respirations 24, BP 139/105 mmHg O2 sat 100% on room air.  The patient received cefepime, metronidazole, vancomycin, 1000 mL normal saline bolus, prochlorperazine 10 mg IVP, ondansetron 4 mg IVP, hydromorphone 1 mg IVP and acetaminophen 1000 mg oral.  Review of Systems: As mentioned in the history of present illness. All other systems reviewed and are negative. Past Medical History:  Diagnosis Date   Benign essential hypertension 10/06/2017   Last Assessment & Plan:  Formatting of this note might be different from the original. Patient's blood pressure noted to be elevated at today's visit 188/98.  Patient will be provided with antihypertensives in the treatment center if needed in order to meet her Avastin parameters.  Patient recommended to follow up with her primary care physician regarding medication management.  Patient is not cur   Cancer [facility])    Colon cancer (HCC)    Hypertension    Renal calculi    bilateral   Past Surgical History:  Procedure Laterality Date   BILIARY BRUSHING  03/24/2022   Procedure: BILIARY BRUSHING;  Surgeon: Meridee Score Netty Starring., MD;  Location: [facility] ENDOSCOPY;  Service: Gastroenterology;;   BILIARY DILATION  03/24/2022   Procedure: BILIARY DILATION;  Surgeon: Meridee Score Netty Starring., MD;  Location: [facility] ENDOSCOPY;  Service: Gastroenterology;;   BILIARY STENT PLACEMENT  03/24/2022   Procedure: BILIARY STENT PLACEMENT;  Surgeon:  Lemar Lofty., MD;  Location: [facility] ENDOSCOPY;  Service: Gastroenterology;;   BIOPSY  03/24/2022   Procedure: BIOPSY;  Surgeon: Lemar Lofty., MD;  Location: [facility] ENDOSCOPY;  Service: Gastroenterology;;   BIOPSY  11/23/2022   Procedure: BIOPSY;  Surgeon: Hilarie Fredrickson, MD;  Location: [facility] ENDOSCOPY;  Service: Gastroenterology;;   CESAREAN SECTION     x2   COLONOSCOPY N/A 11/23/2022   Procedure: COLONOSCOPY;  Surgeon: Hilarie Fredrickson, MD;  Location: [facility] ENDOSCOPY;  Service: Gastroenterology;  Laterality: N/A;   CYSTOSCOPY WITH RETROGRADE PYELOGRAM, URETEROSCOPY AND STENT PLACEMENT Right 10/22/2020   Procedure: CYSTOSCOPY WITH RETROGRADE PYELOGRAM, URETEROSCOPY AND STENT PLACEMENT;  Surgeon: Noel Christmas, MD;  Location: [facility] ORS;  Service: Urology;  Laterality: Right;   ERCP N/A 03/24/2022   Procedure: ENDOSCOPIC RETROGRADE CHOLANGIOPANCREATOGRAPHY (ERCP);  Surgeon: Lemar Lofty., MD;  Location: [facility] ENDOSCOPY;  Service: Gastroenterology;  Laterality: N/A;   IR PATIENT EVAL TECH 0-60 MINS  12/15/2022   IR RADIOLOGIST EVAL & MGMT  09/16/2019   IR RADIOLOGIST EVAL & MGMT  09/23/2019   IR RADIOLOGIST EVAL & MGMT  09/20/2019   IR RADIOLOGIST EVAL & MGMT  09/30/2019   IR REMOVAL TUN ACCESS W/ PORT W/O FL MOD SED  09/14/2019   IR VENO/EXT/UNI RIGHT  04/11/2022   PANCREATIC STENT PLACEMENT  03/24/2022   Procedure: PANCREATIC STENT PLACEMENT;  Surgeon: Lemar Lofty., MD;  Location: [facility] ENDOSCOPY;  Service: Gastroenterology;;   REMOVAL OF STONES  03/24/2022   Procedure: REMOVAL OF STONES;  Surgeon: Lemar Lofty., MD;  Location: [facility] ENDOSCOPY;  Service: Gastroenterology;;   Dennison Mascot  03/24/2022   Procedure: Dennison Mascot;  Surgeon: Lemar Lofty., MD;  Location: [facility] ENDOSCOPY;  Service: Gastroenterology;;   URETERAL STENT PLACEMENT     Social History:  reports that she has quit smoking. Her smoking use included cigarettes. She has never used smokeless tobacco.  She reports that she does not currently use alcohol. She reports that she does not currently use drugs after having used the following drugs: Hydrocodone.  Allergies  Allergen Reactions   Lisinopril Swelling and Other (See Comments)    Lip swelling and Angioedema   Irinotecan Other (See Comments)    Stomach cramping Patient given 0.5 mL atropine IV and was able to continue infusion.  See Progress note on 10/03/20.    Leucovorin Calcium Itching    Patient reported itchy palms towards end of transfusion, see progress note 08/04/22.    Oxaliplatin Itching    Patient reported itchy palms towards end of transfusion, see progress note on 08/04/22.    Family History  Problem Relation Age of Onset   Hypertension Mother    Diabetes Mellitus II Father    Stroke Father    Ulcers Sister    Colon cancer Neg Hx    Esophageal cancer Neg Hx    Inflammatory bowel disease Neg Hx    Liver disease Neg Hx    Pancreatic cancer Neg Hx    Rectal cancer Neg Hx    Stomach cancer Neg Hx     Prior to Admission medications   Medication Sig Start Date End Date Taking? Authorizing Provider  amLODipine (NORVASC) 10 MG tablet Take 1 tablet (10 mg total) by mouth daily. 11/24/22   Rodolph Bong,  MD  chlorthalidone (HYGROTON) 25 MG tablet Take 1 tablet (25 mg total) by mouth daily. 11/24/22   Rodolph Bong, MD  dicyclomine (BENTYL) 20 MG tablet Take 1 tablet (20 mg total) by mouth 3 (three) times daily as needed for spasms (rectal/AB pain). 11/24/22   Rodolph Bong, MD  HYDROcodone-acetaminophen (NORCO) 10-325 MG tablet Take 1 tablet by mouth every 6 (six) hours as needed. 12/22/22   Ladene Artist, MD  metoprolol tartrate (LOPRESSOR) 50 MG tablet Take 1 tablet (50 mg total) by mouth 2 (two) times daily. 11/24/22   Rodolph Bong, MD  mometasone-formoterol (DULERA) 200-5 MCG/ACT AERO Inhale 2 puffs into the lungs 2 (two) times daily. 11/24/22   Rodolph Bong, MD  morphine (MS CONTIN) 15 MG  12 hr tablet Take 1 tablet (15 mg total) by mouth every 12 (twelve) hours. 12/03/22   Rana Snare, NP  polyethylene glycol powder (MIRALAX) 17 GM/SCOOP powder Take 17 g by mouth 2 (two) times daily. 11/24/22   Rodolph Bong, MD  potassium chloride (MICRO-K) 10 MEQ CR capsule Take 2 capsules (20 mEq total) by mouth 2 (two) times daily. 11/24/22   Rodolph Bong, MD  prochlorperazine (COMPAZINE) 10 MG tablet Take 1 tablet (10 mg total) by mouth every 6 (six) hours as needed for nausea or vomiting. Patient not taking: Reported on 12/22/2022 04/28/22   Rana Snare, NP  senna-docusate (SENOKOT-S) 8.6-50 MG tablet Take 1 tablet by mouth 2 (two) times daily. Patient not taking: Reported on 12/22/2022 11/24/22   Rodolph Bong, MD  umeclidinium bromide (INCRUSE ELLIPTA) 62.5 MCG/ACT AEPB Inhale 1 puff into the lungs daily. 11/25/22   Rodolph Bong, MD    Physical Exam: Vitals:   12/24/22 0530 12/24/22 0545 12/24/22 0600 12/24/22 0635  BP: (!) 122/98 131/85 (!) 152/126 (!) 139/94  Pulse:    (!) 104  Resp: 20 19 14 17   Temp:   98.5 F (36.9 C) 98.5 F (36.9 C)  TempSrc:   Oral Oral  SpO2:    97%  Weight:      Height:       Physical Exam Vitals and nursing note reviewed.  Constitutional:      General: She is awake. She is not in acute distress.    Appearance: She is well-developed. She is ill-appearing.  HENT:     Head: Normocephalic.     Nose: No rhinorrhea.     Mouth/Throat:     Mouth: Mucous membranes are dry.  Eyes:     General: No scleral icterus.    Pupils: Pupils are equal, round, and reactive to light.  Neck:     Vascular: No JVD.  Cardiovascular:     Rate and Rhythm: Normal rate and regular rhythm.     Heart sounds: S1 normal and S2 normal.  Pulmonary:     Effort: Pulmonary effort is normal.     Breath sounds: Normal breath sounds. No wheezing, rhonchi or rales.  Abdominal:     General: Bowel sounds are normal. There is no distension.     Palpations:  Abdomen is soft.     Tenderness: There is abdominal tenderness in the epigastric area, periumbilical area, left upper quadrant and left lower quadrant.  Musculoskeletal:     Cervical back: Neck supple.     Right lower leg: No edema.     Left lower leg: No edema.  Skin:    General: Skin is warm and dry.  Neurological:     General: No focal deficit present.     Mental Status: She is alert and oriented to person, place, and time.  Psychiatric:        Mood and Affect: Mood normal.        Behavior: Behavior normal. Behavior is cooperative.     Data Reviewed:  Results are pending, will review when available.  EKG: Vent. rate 113 BPM PR interval 132 ms QRS duration 104 ms QT/QTcB 368/505 ms P-R-T axes 69 62 50 Sinus tachycardia Atrial premature complex Biatrial enlargement Left ventricular hypertrophy Borderline prolonged QT interval  Assessment and Plan: Principal Problem:   Sepsis due to undetermined organism (HCC) Admit to telemetry/inpatient. Continue IV fluids. Continue cefepime 2 g every 8 hours.   Continue metronidazole 500 mg IVPB q 12 hr. Continue vancomycin per pharmacy. Follow-up blood culture and sensitivity Follow CBC and CMP in a.m.  Active Problems:   Hypokalemia    Hyponatremia Secondary to GI losses. Supplementing. Follow-up sodium and potassium in AM.    Abnormal LFTs In the setting of metastatic colon cancer. Also volume depleted. Follow-up LFTs in AM.    Ascending aortic aneurysm (HCC) Follow-up with PCP for imaging surveillance/monitoring.    Colorectal cancer, stage IV (HCC) Chemotherapy was declined by [place] [facility]. Follow-up with oncology at the cancer center.    Advance Care Planning:   Code Status: Full Code   Consults:   Family Communication:   Severity of Illness: The appropriate patient status for this patient is INPATIENT. Inpatient status is judged to be reasonable and necessary in order to provide the required intensity  of service to ensure the patient's safety. The patient's presenting symptoms, physical exam findings, and initial radiographic and laboratory data in the context of their chronic comorbidities is felt to place them at high risk for further clinical deterioration. Furthermore, it is not anticipated that the patient will be medically stable for discharge from the hospital within 2 midnights of admission.   * I certify that at the point of admission it is my clinical judgment that the patient will require inpatient hospital care spanning beyond 2 midnights from the point of admission due to high intensity of service, high risk for further deterioration and high frequency of surveillance required.*  Author: Bobette Mo, MD 12/24/2022 7:51 AM  For on call review [email or web address].   This document was prepared using Dragon voice recognition software and may contain some unintended transcription errors.

## 2022-12-24 NOTE — Sepsis Progress Note (Signed)
Following for sepsis monitoring ?

## 2022-12-24 NOTE — ED Notes (Signed)
ED TO INPATIENT HANDOFF REPORT  Name/Age/Gender April [name] 47 y.o. female  Code Status Code Status History     Date Active Date Inactive Code Status Order ID Comments User Context   12/16/2022 0435 12/18/2022 2042 Full Code [id number]  Briscoe Deutscher, MD ED   11/20/2022 0022 11/24/2022 1846 Full Code [id number]  Charlsie Quest, MD Inpatient   07/24/2022 2306 07/26/2022 1618 Full Code [id number]  Gery Pray, MD ED   04/16/2022 1708 04/18/2022 1606 Full Code [id number]  Glade Lloyd, MD ED   03/21/2022 1907 03/28/2022 0016 Full Code [id number]  Anselm Jungling, DO Inpatient   10/23/2020 0010 10/25/2020 1524 Full Code [id number]  Briscoe Deutscher, MD Inpatient   01/07/2018 1206 01/10/2018 1520 Full Code [id number]  Bobette Mo, MD Inpatient    Questions for Most Recent Historical Code Status (Order [id number])     Question Answer   By: Consent: discussion documented in EHR            Home/SNF/Other Home  Chief Complaint Sepsis due to undetermined organism (HCC) [A41.9]  Level of Care/Admitting Diagnosis ED Disposition     ED Disposition  Admit   Condition  --   Comment  Hospital Area: [facility] [[id number]]  Level of Care: Telemetry [5]  Admit to tele based on following criteria: Monitor QTC interval  May admit patient to [facility] or [facility] if equivalent level of care is available:: No  Covid Evaluation: Asymptomatic - no recent exposure (last 10 days) testing not required  Diagnosis: Sepsis due to undetermined organism [facility]) [[id number]]  Admitting Physician: Bobette Mo [[id number]]  Attending Physician: Bobette Mo [[id number]]  Certification:: I certify this patient will need inpatient services for at least 2 midnights  Expected Medical Readiness: 12/26/2022          Medical History Past Medical History:  Diagnosis Date   Benign essential hypertension 10/06/2017   Last Assessment & Plan:  Formatting of this note might be  different from the original. Patient's blood pressure noted to be elevated at today's visit 188/98.  Patient will be provided with antihypertensives in the treatment center if needed in order to meet her Avastin parameters.  Patient recommended to follow up with her primary care physician regarding medication management.  Patient is not cur   Cancer (HCC)    Colon cancer (HCC)    Hypertension    Renal calculi    bilateral    Allergies Allergies  Allergen Reactions   Lisinopril Swelling and Other (See Comments)    Lip swelling and Angioedema   Irinotecan Other (See Comments)    Stomach cramping Patient given 0.5 mL atropine IV and was able to continue infusion.  See Progress note on 10/03/20.    Leucovorin Calcium Itching    Patient reported itchy palms towards end of transfusion, see progress note 08/04/22.    Oxaliplatin Itching    Patient reported itchy palms towards end of transfusion, see progress note on 08/04/22.    IV Location/Drains/Wounds Patient Lines/Drains/Airways Status     Active Line/Drains/Airways     Name Placement date Placement time Site Days   Peripheral IV 12/24/22 20 G Anterior;Left;Proximal Forearm 12/24/22  0144  Forearm  less than 1   Peripheral IV 12/24/22 1.88" Anterior;Right Forearm 12/24/22  0250  Forearm  less than 1   PICC Double Lumen 12/16/22 Left Brachial 41 cm 1 cm 12/16/22  1833  -- 8   Ureteral Drain/Stent Right ureter  6 Fr. 10/23/20  0011  Right ureter  792   GI Stent 03/24/22  1504  --  275   GI Stent 03/24/22  1548  --  275   GI Stent 03/24/22  1551  --  275            Labs/Imaging Results for orders placed or performed during the hospital encounter of 12/24/22 (from the past 48 hour(s))  Comprehensive metabolic panel     Status: Abnormal   Collection Time: 12/24/22 12:49 AM  Result Value Ref Range   Sodium 132 (L) 135 - 145 mmol/L   Potassium 2.7 (LL) 3.5 - 5.1 mmol/L    Comment: CRITICAL RESULT CALLED TO, READ BACK BY AND  VERIFIED WITH Lawrence Marseilles, RN 12/24/22 0122 J. COLE   Chloride 93 (L) 98 - 111 mmol/L   CO2 26 22 - 32 mmol/L   Glucose, Bld 109 (H) 70 - 99 mg/dL    Comment: Glucose reference range applies only to samples taken after fasting for at least 8 hours.   BUN 8 6 - 20 mg/dL   Creatinine, Ser 4.09 0.44 - 1.00 mg/dL   Calcium 9.3 8.9 - 81.1 mg/dL   Total Protein 8.4 (H) 6.5 - 8.1 g/dL   Albumin 3.8 3.5 - 5.0 g/dL   AST 47 (H) 15 - 41 U/L   ALT 55 (H) 0 - 44 U/L   Alkaline Phosphatase 354 (H) 38 - 126 U/L   Total Bilirubin 0.5 <1.2 mg/dL   GFR, Estimated >91 >47 mL/min    Comment: (NOTE) Calculated using the CKD-EPI Creatinine Equation (2021)    Anion gap 13 5 - 15    Comment: Performed at [facility], [address] [place], [place] [address]  CBC with Differential     Status: Abnormal   Collection Time: 12/24/22 12:49 AM  Result Value Ref Range   WBC 10.7 (H) 4.0 - 10.5 K/uL   RBC 4.38 3.87 - 5.11 MIL/uL   Hemoglobin 13.5 12.0 - 15.0 g/dL   HCT 21.3 08.6 - 57.8 %   MCV 90.6 80.0 - 100.0 fL   MCH 30.8 26.0 - 34.0 pg   MCHC 34.0 30.0 - 36.0 g/dL   RDW 46.9 62.9 - 52.8 %   Platelets 318 150 - 400 K/uL   nRBC 0.0 0.0 - 0.2 %   Neutrophils Relative % 76 %   Neutro Abs 8.1 (H) 1.7 - 7.7 K/uL   Lymphocytes Relative 14 %   Lymphs Abs 1.5 0.7 - 4.0 K/uL   Monocytes Relative 7 %   Monocytes Absolute 0.7 0.1 - 1.0 K/uL   Eosinophils Relative 2 %   Eosinophils Absolute 0.2 0.0 - 0.5 K/uL   Basophils Relative 0 %   Basophils Absolute 0.0 0.0 - 0.1 K/uL   Immature Granulocytes 1 %   Abs Immature Granulocytes 0.05 0.00 - 0.07 K/uL    Comment: Performed at [facility], [address], [place], [place] [address]  Protime-INR     Status: None   Collection Time: 12/24/22 12:50 AM  Result Value Ref Range   Prothrombin Time 13.3 11.4 - 15.2 seconds   INR 1.0 0.8 - 1.2    Comment: (NOTE) INR goal varies based on device and disease states. Performed at  [facility], [address], [place], [place] [address]   APTT     Status: None   Collection Time: 12/24/22 12:50 AM  Result Value Ref  Range   aPTT 31 24 - 36 seconds    Comment: Performed at [facility], [address] [place], [place] [address]  I-Stat Lactic Acid, ED     Status: None   Collection Time: 12/24/22  1:00 AM  Result Value Ref Range   Lactic Acid, Venous 0.6 0.5 - 1.9 mmol/L  Resp panel by RT-PCR (RSV, Flu A&B, Covid) Anterior Nasal Swab     Status: None   Collection Time: 12/24/22  1:53 AM   Specimen: Anterior Nasal Swab  Result Value Ref Range   SARS Coronavirus 2 by RT PCR NEGATIVE NEGATIVE    Comment: (NOTE) SARS-CoV-2 target nucleic acids are NOT DETECTED.  The SARS-CoV-2 RNA is generally detectable in upper respiratory specimens during the acute phase of infection. The lowest concentration of SARS-CoV-2 viral copies this assay can detect is 138 copies/mL. A negative result does not preclude SARS-Cov-2 infection and should not be used as the sole basis for treatment or other patient management decisions. A negative result may occur with  improper specimen collection/handling, submission of specimen other than nasopharyngeal swab, presence of viral mutation(s) within the areas targeted by this assay, and inadequate number of viral copies(<138 copies/mL). A negative result must be combined with clinical observations, patient history, and epidemiological information. The expected result is Negative.  Fact Sheet for Patients:  BloggerCourse.com  Fact Sheet for Healthcare Providers:  SeriousBroker.it  This test is no t yet approved or cleared by the [place] FDA and  has been authorized for detection and/or diagnosis of SARS-CoV-2 by FDA under an Emergency Use Authorization (EUA). This EUA will remain  in effect (meaning this test can be used) for the duration of  the COVID-19 declaration under Section 564(b)(1) of the Act, 21 U.S.C.section 360bbb-3(b)(1), unless the authorization is terminated  or revoked sooner.       Influenza A by PCR NEGATIVE NEGATIVE   Influenza B by PCR NEGATIVE NEGATIVE    Comment: (NOTE) The Xpert Xpress SARS-CoV-2/FLU/RSV plus assay is intended as an aid in the diagnosis of influenza from Nasopharyngeal swab specimens and should not be used as a sole basis for treatment. Nasal washings and aspirates are unacceptable for Xpert Xpress SARS-CoV-2/FLU/RSV testing.  Fact Sheet for Patients: BloggerCourse.com  Fact Sheet for Healthcare Providers: SeriousBroker.it  This test is not yet approved or cleared by the [place] FDA and has been authorized for detection and/or diagnosis of SARS-CoV-2 by FDA under an Emergency Use Authorization (EUA). This EUA will remain in effect (meaning this test can be used) for the duration of the COVID-19 declaration under Section 564(b)(1) of the Act, 21 U.S.C. section 360bbb-3(b)(1), unless the authorization is terminated or revoked.     Resp Syncytial Virus by PCR NEGATIVE NEGATIVE    Comment: (NOTE) Fact Sheet for Patients: BloggerCourse.com  Fact Sheet for Healthcare Providers: SeriousBroker.it  This test is not yet approved or cleared by the [place] FDA and has been authorized for detection and/or diagnosis of SARS-CoV-2 by FDA under an Emergency Use Authorization (EUA). This EUA will remain in effect (meaning this test can be used) for the duration of the COVID-19 declaration under Section 564(b)(1) of the Act, 21 U.S.C. section 360bbb-3(b)(1), unless the authorization is terminated or revoked.  Performed at [facility], [address] [place], [place] [address]   I-Stat Lactic Acid, ED     Status: None   Collection Time: 12/24/22  2:59 AM   Result Value Ref Range  Lactic Acid, Venous 1.0 0.5 - 1.9 mmol/L  Urinalysis, w/ Reflex to Culture (Infection Suspected) -Urine, Clean Catch     Status: Abnormal   Collection Time: 12/24/22  6:13 AM  Result Value Ref Range   Specimen Source URINE, CLEAN CATCH    Color, Urine YELLOW YELLOW   APPearance CLEAR CLEAR   Specific Gravity, Urine >1.046 (H) 1.005 - 1.030   pH 5.0 5.0 - 8.0   Glucose, UA NEGATIVE NEGATIVE mg/dL   Hgb urine dipstick SMALL (A) NEGATIVE   Bilirubin Urine NEGATIVE NEGATIVE   Ketones, ur NEGATIVE NEGATIVE mg/dL   Protein, ur NEGATIVE NEGATIVE mg/dL   Nitrite NEGATIVE NEGATIVE   Leukocytes,Ua NEGATIVE NEGATIVE   RBC / HPF 6-10 0 - 5 RBC/hpf   WBC, UA 0-5 0 - 5 WBC/hpf    Comment:        Reflex urine culture not performed if WBC <=10, OR if Squamous epithelial cells >5. If Squamous epithelial cells >5 suggest recollection.    Bacteria, UA NONE SEEN NONE SEEN   Squamous Epithelial / HPF 0-5 0 - 5 /HPF   Mucus PRESENT     Comment: Performed at [facility], [address], [place], [place] [address]   *Note: Due to a large number of results and/or encounters for the requested time period, some results have not been displayed. A complete set of results can be found in Results Review.   CT ABDOMEN PELVIS W CONTRAST  Result Date: 12/24/2022 CLINICAL DATA:  47 year old with metastatic colon cancer presents with abdominal pain for 3 days. History of pancreatic stent. EXAM: CT ABDOMEN AND PELVIS WITH CONTRAST TECHNIQUE: Multidetector CT imaging of the abdomen and pelvis was performed using the standard protocol following bolus administration of intravenous contrast. RADIATION DOSE REDUCTION: This exam was performed according to the departmental dose-optimization program which includes automated exposure control, adjustment of the mA and/or kV according to patient size and/or use of iterative reconstruction technique. CONTRAST:  OMNIPAQUE  IOHEXOL 300 MG/ML  SOLN COMPARISON:  CTs with IV contrast 12/15/2022 and 11/19/2022. FINDINGS: Lower chest: No acute abnormality. Hepatobiliary: Stenting again noted of the left and right hepatic ducts extending down the common bile duct into the duodenum terminating in the horizontal duodenum to the left. Intrahepatic biliary dilatation, greater in the left lobe is similar to the last CT but increased since 11/19/2022. The greatest dilatation is in the central bile ducts which demonstrate wall thickening near the hepatic hilum which could be related to cholangitis or infiltrating disease. This was present on both prior studies. Hepatic parenchymal enhancement is more homogeneous than on the last study possible bolus phase related. Major hepatic vascular structures are patent. There is moderately dense material in gallbladder consistent with vicarious contrast excretion or dense sludge. No stones, wall thickening or pericholecystic fluid are seen. There is a 2.1 cm heterogeneous mass in the lateral aspect of left hepatic segment 2, again most likely representing a metastasis and not seen on 11/19/2022. Focal periligamentous fat is seen in segment 4 B. in segment 5 there is a potentially new 5 mm hypodensity on 2:24 which could be a small metastasis. In segment 8 there is a 4 mm chronic cyst on 2:14. No other visible focal liver abnormality. Pancreas: Slightly dilated proximal pancreatic duct 4 mm, unchanged. No further abnormality. Spleen: No abnormality. Adrenals/Urinary Tract: There is no adrenal mass, no renal mass enhancement. 1.9 cm Bosniak 1 cyst in the posterior left kidney measures 11 Hounsfield units.  There is bilateral nephrolithiasis. Largest stone on the left is in the upper pole measuring 1.1 cm, largest on the right is in the lower pole and measures 8 mm. There is cortical scarring in the right lower pole but no mass enhancement of either kidney. No obstructing stones or hydronephrosis.  Unremarkable  bladder. Stomach/Bowel: There are thickened folds in the stomach. Thickened folds in the distal duodenum and jejunum consistent with gastroenteritis. No small bowel dilatation or inflammatory changes seen. The appendix is normal. There are increasingly thickened folds in the transverse colon. Given the short interval timeframe of the appearance of this, inflammatory or infectious etiology such as colitis is more likely than infiltrating disease. There is moderate thickening in the presacral rectum again noted consistent with neoplasm or radiation changes. There is mild-to-moderate fecal stasis in the remaining large bowel. Vascular/Lymphatic: Aortic atherosclerosis. No enlarged abdominal or pelvic lymph nodes. Reproductive: Uterus and bilateral adnexa are unremarkable. Other: Multiple peritoneal/omental implants. Examples include a solid nodule in mid left paracolic gutter measuring 1.7 x 1.4 cm on 2:38, scattered smaller nodules distal to this in the more distal gutter, omental implant in the anterior right lower abdomen measuring 1.7 x 1.3 cm on 2:49, and an omental nodule right of the midline in the mid abdomen measuring 1.5 x 1.7 on 2: 42. These omental implants are not notably changed since 9 days ago but are slightly larger than on 11/19/2022. There is a small volume of pelvic ascites the left. There is no free air. 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 No bone metastasis is seen. IMPRESSION: 1. Increasingly thickened folds in the transverse colon, more likely to be infectious or inflammatory than neoplastic. 2. Gastroenteritis. 3. 2.1 cm heterogeneous mass in the lateral aspect of left hepatic segment 2, most likely a metastasis and not seen on 11/19/2022. 4. Potentially new 5 mm hypodensity in segment 5 which could be a small metastasis. 5. Stable biliary  stents with intrahepatic biliary dilatation, greater in the left lobe, similar to the last CT but increased since 11/19/2022. 6. Central biliary wall thickening near the hepatic hilum which could be related to cholangitis or infiltrating disease. 7. Multiple peritoneal/omental implants, not notably changed since 9 days ago but slightly larger than on 11/19/2022. 8. Small volume of pelvic ascites. 9. Constipation. 10. Aortic atherosclerosis. 11. Bilateral nephrolithiasis. 12. 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 Aortic Atherosclerosis (ICD10-I70.0). Electronically Signed   By: Almira Bar M.D.   On: 12/24/2022 04:56   DG Chest Port 1 View  Result Date: 12/24/2022 CLINICAL DATA:  Colon cancer, nausea, diarrhea, possible sepsis EXAM: PORTABLE CHEST 1 VIEW COMPARISON:  CT chest dated 12/15/2022 FINDINGS: Lungs are clear.  No pleural effusion or pneumothorax. The heart is normal in size. IMPRESSION: No acute cardiopulmonary disease. Electronically Signed   By: Charline Bills M.D.   On: 12/24/2022 01:09    Pending Labs Unresulted Labs (From admission, onward)     Start     Ordered   12/24/22 0750  Basic metabolic panel  ONCE - STAT,   STAT        12/24/22 0750   12/24/22 0750  Magnesium  Once,   STAT        12/24/22 0750   12/24/22 0750  Phosphorus  Once,   STAT  12/24/22 0750   12/24/22 0051  Culture, blood (single)  Once,   STAT        12/24/22 0050   12/24/22 0032  C Difficile Quick Screen w PCR reflex  (Septic presentation on arrival (screening labs, nursing and treatment orders for obvious sepsis))  Once, for 24 hours,   URGENT       References:    CDiff Information Tool   12/24/22 0032   12/24/22 0031  Blood Culture (routine x 2)  (Septic presentation on arrival (screening labs, nursing and treatment orders for obvious sepsis))  BLOOD CULTURE X 2,   STAT      12/24/22 0032             Vitals/Pain Today's Vitals   12/24/22 0545 12/24/22 0600 12/24/22 0618 12/24/22 0635  BP: 131/85 (!) 152/126  (!) 139/94  Pulse:    (!) 104  Resp: 19 14  17   Temp:  98.5 F (36.9 C)  98.5 F (36.9 C)  TempSrc:  Oral  Oral  SpO2:    97%  Weight:      Height:      PainSc:  8  8      Isolation Precautions Enteric precautions (UV disinfection)  Medications Medications  0.9 %  sodium chloride infusion ( Intravenous New Bag/Given 12/24/22 0142)  HYDROmorphone (DILAUDID) injection 1 mg (1 mg Intravenous Given 12/24/22 0619)  HYDROmorphone (DILAUDID) injection 0.5 mg (has no administration in time range)  potassium chloride 10 mEq in 100 mL IVPB (10 mEq Intravenous New Bag/Given 12/24/22 0804)  sodium chloride 0.9 % bolus 1,000 mL (0 mLs Intravenous Stopped 12/24/22 0240)  ceFEPIme (MAXIPIME) 2 g in sodium chloride 0.9 % 100 mL IVPB (0 g Intravenous Stopped 12/24/22 0342)  metroNIDAZOLE (FLAGYL) IVPB 500 mg (0 mg Intravenous Stopped 12/24/22 0532)  vancomycin (VANCOCIN) IVPB 1000 mg/200 mL premix (0 mg Intravenous Stopped 12/24/22 0250)  acetaminophen (TYLENOL) tablet 1,000 mg (1,000 mg Oral Given 12/24/22 0116)  prochlorperazine (COMPAZINE) injection 10 mg (10 mg Intravenous Given 12/24/22 0116)  potassium chloride 10 mEq in 100 mL IVPB (0 mEq Intravenous Stopped 12/24/22 0532)  iohexol (OMNIPAQUE) 300 MG/ML solution 100 mL (100 mLs Intravenous Contrast Given 12/24/22 0346)  ondansetron (ZOFRAN) injection 4 mg (4 mg Intravenous Given 12/24/22 0635)    Mobility walks

## 2022-12-24 NOTE — ED Triage Notes (Signed)
BIB EMS from home for generalized abd pain with nausea, vomiting, diarrhea progressively worsening x 3 days. Hx colon CA. Recently had chemo treatments stopped due to insurance issues. Patient does endorse diaphoresis.

## 2022-12-24 NOTE — Progress Notes (Signed)
ED Pharmacy Antibiotic Sign Off An antibiotic consult was received from an ED provider for vancomycin and cefepime per pharmacy dosing for sepsis. A chart review was completed to assess appropriateness.   The following one time order(s) were placed:  Vanc 1gm and cefepime 2gm  Further antibiotic and/or antibiotic pharmacy consults should be ordered by the admitting provider if indicated.   Thank you for allowing pharmacy to be a part of this patient's care.  Arley Phenix RPh 12/24/2022, 12:40 AM

## 2022-12-24 NOTE — Plan of Care (Signed)
  Problem: Clinical Measurements: Goal: Diagnostic test results will improve Outcome: Progressing   Problem: Activity: Goal: Risk for activity intolerance will decrease Outcome: Progressing   Problem: Nutrition: Goal: Adequate nutrition will be maintained Outcome: Progressing   Problem: Pain Management: Goal: General experience of comfort will improve Outcome: Progressing   Problem: Safety: Goal: Ability to remain free from injury will improve Outcome: Progressing   Problem: Clinical Measurements: Goal: Diagnostic test results will improve Outcome: Progressing

## 2022-12-25 ENCOUNTER — Inpatient Hospital Stay: Payer: 59

## 2022-12-25 ENCOUNTER — Inpatient Hospital Stay: Payer: 59 | Admitting: Oncology

## 2022-12-25 DIAGNOSIS — R7989 Other specified abnormal findings of blood chemistry: Secondary | ICD-10-CM

## 2022-12-25 DIAGNOSIS — A419 Sepsis, unspecified organism: Secondary | ICD-10-CM | POA: Diagnosis not present

## 2022-12-25 DIAGNOSIS — R112 Nausea with vomiting, unspecified: Secondary | ICD-10-CM

## 2022-12-25 DIAGNOSIS — B961 Klebsiella pneumoniae [K. pneumoniae] as the cause of diseases classified elsewhere: Secondary | ICD-10-CM

## 2022-12-25 DIAGNOSIS — C787 Secondary malignant neoplasm of liver and intrahepatic bile duct: Secondary | ICD-10-CM

## 2022-12-25 DIAGNOSIS — R197 Diarrhea, unspecified: Secondary | ICD-10-CM | POA: Diagnosis not present

## 2022-12-25 DIAGNOSIS — R509 Fever, unspecified: Secondary | ICD-10-CM | POA: Diagnosis not present

## 2022-12-25 DIAGNOSIS — C19 Malignant neoplasm of rectosigmoid junction: Secondary | ICD-10-CM

## 2022-12-25 LAB — COMPREHENSIVE METABOLIC PANEL
ALT: 43 U/L (ref 0–44)
AST: 47 U/L — ABNORMAL HIGH (ref 15–41)
Albumin: 3.1 g/dL — ABNORMAL LOW (ref 3.5–5.0)
Alkaline Phosphatase: 293 U/L — ABNORMAL HIGH (ref 38–126)
Anion gap: 8 (ref 5–15)
BUN: 6 mg/dL (ref 6–20)
CO2: 26 mmol/L (ref 22–32)
Calcium: 8.6 mg/dL — ABNORMAL LOW (ref 8.9–10.3)
Chloride: 99 mmol/L (ref 98–111)
Creatinine, Ser: 0.4 mg/dL — ABNORMAL LOW (ref 0.44–1.00)
GFR, Estimated: >60 mL/min (ref >60)
Glucose, Bld: 87 mg/dL (ref 70–99)
Potassium: 3.6 mmol/L (ref 3.5–5.1)
Sodium: 133 mmol/L — ABNORMAL LOW (ref 135–145)
Total Bilirubin: 0.5 mg/dL (ref <1.2)
Total Protein: 7.1 g/dL (ref 6.5–8.1)

## 2022-12-25 LAB — CBC
HCT: 33.4 % — ABNORMAL LOW (ref 36.0–46.0)
Hemoglobin: 11.2 g/dL — ABNORMAL LOW (ref 12.0–15.0)
MCH: 30.8 pg (ref 26.0–34.0)
MCHC: 33.5 g/dL (ref 30.0–36.0)
MCV: 91.8 fL (ref 80.0–100.0)
Platelets: 259 10*3/uL (ref 150–400)
RBC: 3.64 MIL/uL — ABNORMAL LOW (ref 3.87–5.11)
RDW: 13.5 % (ref 11.5–15.5)
WBC: 6.2 10*3/uL (ref 4.0–10.5)
nRBC: 0 % (ref 0.0–0.2)

## 2022-12-25 MED ORDER — HYDROMORPHONE HCL 1 MG/ML IJ SOLN
0.5000 mg | Freq: Once | INTRAMUSCULAR | Status: AC
  Administered 2022-12-25: 0.5 mg via INTRAVENOUS
  Filled 2022-12-25: qty 0.5

## 2022-12-25 MED ORDER — MORPHINE SULFATE ER 15 MG PO TBCR
15.0000 mg | EXTENDED_RELEASE_TABLET | Freq: Two times a day (BID) | ORAL | Status: DC
  Administered 2022-12-25 – 2022-12-26 (×3): 15 mg via ORAL
  Filled 2022-12-25 (×3): qty 1

## 2022-12-25 MED ORDER — AMLODIPINE BESYLATE 10 MG PO TABS
10.0000 mg | ORAL_TABLET | Freq: Every day | ORAL | Status: DC
Start: 2022-12-25 — End: 2022-12-26
  Administered 2022-12-25 – 2022-12-26 (×2): 10 mg via ORAL
  Filled 2022-12-25 (×2): qty 1

## 2022-12-25 MED ORDER — DICYCLOMINE HCL 20 MG PO TABS: 20.0000 mg | ORAL_TABLET | Freq: Three times a day (TID) | ORAL | Status: DC | PRN

## 2022-12-25 MED ORDER — METOPROLOL TARTRATE 50 MG PO TABS
50.0000 mg | ORAL_TABLET | Freq: Two times a day (BID) | ORAL | Status: DC
Start: 2022-12-25 — End: 2022-12-26
  Administered 2022-12-25 – 2022-12-26 (×3): 50 mg via ORAL
  Filled 2022-12-25 (×3): qty 1

## 2022-12-25 NOTE — Progress Notes (Signed)
PROGRESS NOTE    April [name]  AVW:[id number] DOB: 13-Jun-1975 DOA: 12/24/2022 PCP: Tollie Eth, NP   Brief Narrative:  47 y.o. female with medical history significant of hypertension, nephrolithiasis, emphysema stage IV colon cancer and chronic cancer-related pain, malignant biliary stricture status post stenting, ascending aortic aneurysm and recent admission and discharge from 12/16/2022 to 12/18/2022 for acute submandibular sialadenitis initially treated with IV antibiotics and subsequently discharged home on oral antibiotics presented with worsening abdominal pain, nausea, vomiting, diarrhea and fever.  On presentation, she was febrile 102.2, tachypneic, tachycardic.  WBC of 10.7; negative COVID-19/influenza/RSV PCR.  Chest x-ray was negative for acute cardiopulmonary disease.  CT of abdomen/pelvis with contrast showed increasingly thickened folds in the transverse colon, infectious or inflammatory along with gastroenteritis along with metastatic disease.  She was started on IV fluids and antibiotics.  Assessment & Plan:   Sepsis: Present on admission Klebsiella pneumoniae bacteremia -Presented with worsening abdominal pain, nausea, vomiting, diarrhea and fever and was found to be febrile, tachypneic, tachycardic with Klebsiella pneumoniae bacteremia.  Follow susceptibilities.  Initially started on broad-spectrum antibiotics but currently on IV Rocephin.  Bacteremia is possible from diarrheal illness.  Diarrhea has subsided.  Imaging as above.  Hypokalemia -Improved  Hyponatremia -Mild.  Encourage oral intake.  Stage IV colon cancer Chronic cancer-related pain Abnormal LFTs, probably from liver mets disease with history of malignant biliary stricture status post stenting -Imaging shows stable biliary stents with intrahepatic biliary dilatation along with central biliary wall thickening at the hepatic hilum which could be related to cholangitis or infiltrative disease -Continue  current pain management.  Outpatient follow-up with oncology/Dr. Truett Perna -Monitor LFTs.  Patient was supposed to have ERCP as an outpatient.  Will reach out to GI team for further input  COPD/emphysema -Currently stable.  On room air  Ascending aortic aneurysm -noted incidentally on CT during last admission.  Outpatient follow-up  Hypertension -Blood pressure extremely elevated.  Resume amlodipine and metoprolol.  Monitor blood pressure  DVT prophylaxis: Lovenox Code Status:  Full Family Communication: None at bedside Disposition Plan: Status is: Inpatient Remains inpatient appropriate because: Of severity of illness    Consultants: Will reach out to GI.  Have added oncology to care teams  Procedures: None  Antimicrobials:  Anti-infectives (From admission, onward)    Start     Dose/Rate Route Frequency Ordered Stop   12/24/22 2200  cefTRIAXone (ROCEPHIN) 2 g in sodium chloride 0.9 % 100 mL IVPB        2 g 200 mL/hr over 30 Minutes Intravenous Every 24 hours 12/24/22 1759     12/24/22 1600  metroNIDAZOLE (FLAGYL) IVPB 500 mg  Status:  Discontinued        500 mg 100 mL/hr over 60 Minutes Intravenous Every 12 hours 12/24/22 0944 12/24/22 1759   12/24/22 1200  vancomycin (VANCOREADY) IVPB 750 mg/150 mL  Status:  Discontinued        750 mg 150 mL/hr over 60 Minutes Intravenous Every 12 hours 12/24/22 1001 12/24/22 1759   12/24/22 1200  ceFEPIme (MAXIPIME) 2 g in sodium chloride 0.9 % 100 mL IVPB  Status:  Discontinued        2 g 200 mL/hr over 30 Minutes Intravenous Every 8 hours 12/24/22 1001 12/24/22 1759   12/24/22 0045  ceFEPIme (MAXIPIME) 2 g in sodium chloride 0.9 % 100 mL IVPB        2 g 200 mL/hr over 30 Minutes Intravenous  Once 12/24/22 0032 12/24/22 0342  12/24/22 0045  metroNIDAZOLE (FLAGYL) IVPB 500 mg        500 mg 100 mL/hr over 60 Minutes Intravenous  Once 12/24/22 0032 12/24/22 0532   12/24/22 0045  vancomycin (VANCOCIN) IVPB 1000 mg/200 mL premix         1,000 mg 200 mL/hr over 60 Minutes Intravenous  Once 12/24/22 0032 12/24/22 0250        Subjective: Patient seen and examined at bedside.  Still complains of abdominal pain.  Denies any diarrhea since admission.  Denies any chest pain, shortness of breath.  Objective: Vitals:   12/24/22 1324 12/24/22 1620 12/24/22 2003 12/25/22 0427  BP: (!) 158/108 (!) 155/113 (!) 153/113 (!) 146/112  Pulse: 92 85 83 92  Resp:  18 18 17   Temp: 98.5 F (36.9 C) 98.4 F (36.9 C) 98.1 F (36.7 C) 97.7 F (36.5 C)  TempSrc: Oral Oral Oral Oral  SpO2: 100% 100% 100% 100%  Weight:      Height:        Intake/Output Summary (Last 24 hours) at 12/25/2022 1035 Last data filed at 12/25/2022 0500 Gross per 24 hour  Intake 1060 ml  Output --  Net 1060 ml   Filed Weights   12/24/22 0020  Weight: 64.3 kg    Examination:  General exam: Appears calm and comfortable.  On room air. Respiratory system: Bilateral decreased breath sounds at bases Cardiovascular system: S1 & S2 heard, Rate controlled Gastrointestinal system: Abdomen is nondistended, soft and mildly tender. Normal bowel sounds heard. Extremities: No cyanosis, clubbing, edema  Central nervous system: Alert and oriented. No focal neurological deficits. Moving extremities Skin: No rashes, lesions or ulcers Psychiatry: Flat affect.  Not agitated.  Data Reviewed: I have personally reviewed following labs and imaging studies  CBC: Recent Labs  Lab 12/22/22 0821 12/24/22 0049 12/25/22 0426  WBC 8.5 10.7* 6.2  NEUTROABS 3.6 8.1*  --   HGB 13.2 13.5 11.2*  HCT 39.9 39.7 33.4*  MCV 90.7 90.6 91.8  PLT 382 318 259   Basic Metabolic Panel: Recent Labs  Lab 12/22/22 0821 12/24/22 0049 12/24/22 0803 12/25/22 0426  NA 136 132* 134* 133*  K 3.3* 2.7* 2.9* 3.6  CL 95* 93* 97* 99  CO2 31 26 27 26   GLUCOSE 111* 109* 98 87  BUN 16 8 8 6   CREATININE 0.63 0.49 0.43* 0.40*  CALCIUM 10.0 9.3 8.6* 8.6*  MG 1.8  --  1.7  --   PHOS  --    --  3.1  --    GFR: Estimated Creatinine Clearance: 73.5 mL/min (A) (by C-G formula based on SCr of 0.4 mg/dL (L)). Liver Function Tests: Recent Labs  Lab 12/22/22 0821 12/24/22 0049 12/25/22 0426  AST 47* 47* 47*  ALT 64* 55* 43  ALKPHOS 368* 354* 293*  BILITOT 0.2 0.5 0.5  PROT 8.4* 8.4* 7.1  ALBUMIN 4.3 3.8 3.1*   No results for input(s): "LIPASE", "AMYLASE" in the last 168 hours. No results for input(s): "AMMONIA" in the last 168 hours. Coagulation Profile: Recent Labs  Lab 12/24/22 0050  INR 1.0   Cardiac Enzymes: No results for input(s): "CKTOTAL", "CKMB", "CKMBINDEX", "TROPONINI" in the last 168 hours. BNP (last 3 results) No results for input(s): "PROBNP" in the last 8760 hours. HbA1C: No results for input(s): "HGBA1C" in the last 72 hours. CBG: No results for input(s): "GLUCAP" in the last 168 hours. Lipid Profile: No results for input(s): "CHOL", "HDL", "LDLCALC", "TRIG", "CHOLHDL", "LDLDIRECT" in the last  72 hours. Thyroid Function Tests: No results for input(s): "TSH", "T4TOTAL", "FREET4", "T3FREE", "THYROIDAB" in the last 72 hours. Anemia Panel: No results for input(s): "VITAMINB12", "FOLATE", "FERRITIN", "TIBC", "IRON", "RETICCTPCT" in the last 72 hours. Sepsis Labs: Recent Labs  Lab 12/24/22 0100 12/24/22 0259  LATICACIDVEN 0.6 1.0    Recent Results (from the past 240 hours)  Resp panel by RT-PCR (RSV, Flu A&B, Covid) Anterior Nasal Swab     Status: None   Collection Time: 12/15/22  5:19 PM   Specimen: Anterior Nasal Swab  Result Value Ref Range Status   SARS Coronavirus 2 by RT PCR NEGATIVE NEGATIVE Final    Comment: (NOTE) SARS-CoV-2 target nucleic acids are NOT DETECTED.  The SARS-CoV-2 RNA is generally detectable in upper respiratory specimens during the acute phase of infection. The lowest concentration of SARS-CoV-2 viral copies this assay can detect is 138 copies/mL. A negative result does not preclude SARS-Cov-2 infection and should  not be used as the sole basis for treatment or other patient management decisions. A negative result may occur with  improper specimen collection/handling, submission of specimen other than nasopharyngeal swab, presence of viral mutation(s) within the areas targeted by this assay, and inadequate number of viral copies(<138 copies/mL). A negative result must be combined with clinical observations, patient history, and epidemiological information. The expected result is Negative.  Fact Sheet for Patients:  BloggerCourse.com  Fact Sheet for Healthcare Providers:  SeriousBroker.it  This test is no t yet approved or cleared by the [place] FDA and  has been authorized for detection and/or diagnosis of SARS-CoV-2 by FDA under an Emergency Use Authorization (EUA). This EUA will remain  in effect (meaning this test can be used) for the duration of the COVID-19 declaration under Section 564(b)(1) of the Act, 21 U.S.C.section 360bbb-3(b)(1), unless the authorization is terminated  or revoked sooner.       Influenza A by PCR NEGATIVE NEGATIVE Final   Influenza B by PCR NEGATIVE NEGATIVE Final    Comment: (NOTE) The Xpert Xpress SARS-CoV-2/FLU/RSV plus assay is intended as an aid in the diagnosis of influenza from Nasopharyngeal swab specimens and should not be used as a sole basis for treatment. Nasal washings and aspirates are unacceptable for Xpert Xpress SARS-CoV-2/FLU/RSV testing.  Fact Sheet for Patients: BloggerCourse.com  Fact Sheet for Healthcare Providers: SeriousBroker.it  This test is not yet approved or cleared by the [place] FDA and has been authorized for detection and/or diagnosis of SARS-CoV-2 by FDA under an Emergency Use Authorization (EUA). This EUA will remain in effect (meaning this test can be used) for the duration of the COVID-19 declaration under  Section 564(b)(1) of the Act, 21 U.S.C. section 360bbb-3(b)(1), unless the authorization is terminated or revoked.     Resp Syncytial Virus by PCR NEGATIVE NEGATIVE Final    Comment: (NOTE) Fact Sheet for Patients: BloggerCourse.com  Fact Sheet for Healthcare Providers: SeriousBroker.it  This test is not yet approved or cleared by the [place] FDA and has been authorized for detection and/or diagnosis of SARS-CoV-2 by FDA under an Emergency Use Authorization (EUA). This EUA will remain in effect (meaning this test can be used) for the duration of the COVID-19 declaration under Section 564(b)(1) of the Act, 21 U.S.C. section 360bbb-3(b)(1), unless the authorization is terminated or revoked.  Performed at [facility], [address] [place], [place] [address]   Group A Strep by PCR     Status: None   Collection Time: 12/15/22  5:19  PM   Specimen: Anterior Nasal Swab; Sterile Swab  Result Value Ref Range Status   Group A Strep by PCR NOT DETECTED NOT DETECTED Final    Comment: Performed at [facility], [address] [place], [place] [address]  Blood Culture (routine x 2)     Status: None (Preliminary result)   Collection Time: 12/24/22 12:49 AM   Specimen: BLOOD  Result Value Ref Range Status   Specimen Description   Final    BLOOD BLOOD RIGHT WRIST Performed at [facility], [address], [place], [place] [address]    Special Requests   Final    BOTTLES DRAWN AEROBIC AND ANAEROBIC Blood Culture adequate volume Performed at [facility], [address], [place], [place] [address]    Culture   Final    NO GROWTH 1 DAY Performed at [facility], [address], [place], [place] [address]    Report Status PENDING  Incomplete  Blood Culture (routine x 2)     Status: None (Preliminary result)   Collection Time: 12/24/22 12:50 AM   Specimen: BLOOD   Result Value Ref Range Status   Specimen Description   Final    BLOOD PICC LINE Performed at [facility], [address] [place], [place] [address]    Special Requests   Final    BOTTLES DRAWN AEROBIC AND ANAEROBIC Blood Culture adequate volume Performed at [facility], [address], [place], [place] [address]    Culture  Setup Time   Final    GRAM NEGATIVE RODS AEROBIC BOTTLE ONLY CRITICAL VALUE NOTED.  VALUE IS CONSISTENT WITH PREVIOUSLY REPORTED AND CALLED VALUE. Performed at [facility], [address] [place], [place] [address]    Culture GRAM NEGATIVE RODS  Final   Report Status PENDING  Incomplete  Culture, blood (single)     Status: Abnormal (Preliminary result)   Collection Time: 12/24/22  1:35 AM   Specimen: BLOOD  Result Value Ref Range Status   Specimen Description   Final    BLOOD PICC LINE Performed at [facility], [address] [place], [place] [address]    Special Requests   Final    BOTTLES DRAWN AEROBIC AND ANAEROBIC Blood Culture adequate volume Performed at [facility], [address] [place], [place] [address]    Culture  Setup Time   Final    GRAM NEGATIVE RODS ANAEROBIC BOTTLE ONLY CRITICAL RESULT CALLED TO, READ BACK BY AND VERIFIED WITH: PHARMD JUSTIN LEGGE ON 12/24/22 @ 1754 BY DRTQ    Culture (A)  Final    KLEBSIELLA PNEUMONIAE SUSCEPTIBILITIES TO FOLLOW Performed at [facility], [address], [place], [place] [address]    Report Status PENDING  Incomplete  Blood Culture ID Panel (Reflexed)     Status: Abnormal   Collection Time: 12/24/22  1:35 AM  Result Value Ref Range Status   Enterococcus faecalis NOT DETECTED NOT DETECTED Final   Enterococcus Faecium NOT DETECTED NOT DETECTED Final   Listeria monocytogenes NOT DETECTED NOT DETECTED Final   Staphylococcus species NOT DETECTED NOT DETECTED Final   Staphylococcus aureus (BCID) NOT  DETECTED NOT DETECTED Final   Staphylococcus epidermidis NOT DETECTED NOT DETECTED Final   Staphylococcus lugdunensis NOT DETECTED NOT DETECTED Final   Streptococcus species NOT DETECTED NOT DETECTED Final   Streptococcus agalactiae NOT DETECTED NOT DETECTED Final   Streptococcus pneumoniae NOT DETECTED  NOT DETECTED Final   Streptococcus pyogenes NOT DETECTED NOT DETECTED Final   A.calcoaceticus-baumannii NOT DETECTED NOT DETECTED Final   Bacteroides fragilis NOT DETECTED NOT DETECTED Final   Enterobacterales DETECTED (A) NOT DETECTED Final    Comment: Enterobacterales represent a large order of gram negative bacteria, not a single organism. CRITICAL RESULT CALLED TO, READ BACK BY AND VERIFIED WITH: PHARMD JUSTIN LEGGE ON 12/24/22 @ 1754 BY DRT    Enterobacter cloacae complex NOT DETECTED NOT DETECTED Final   Escherichia coli NOT DETECTED NOT DETECTED Final   Klebsiella aerogenes NOT DETECTED NOT DETECTED Final   Klebsiella oxytoca NOT DETECTED NOT DETECTED Final   Klebsiella pneumoniae DETECTED (A) NOT DETECTED Final    Comment: CRITICAL RESULT CALLED TO, READ BACK BY AND VERIFIED WITH: PHARMD JUSTIN LEGGE ON 12/24/22 @ 1754 BY DRT    Proteus species NOT DETECTED NOT DETECTED Final   Salmonella species NOT DETECTED NOT DETECTED Final   Serratia marcescens NOT DETECTED NOT DETECTED Final   Haemophilus influenzae NOT DETECTED NOT DETECTED Final   Neisseria meningitidis NOT DETECTED NOT DETECTED Final   Pseudomonas aeruginosa NOT DETECTED NOT DETECTED Final   Stenotrophomonas maltophilia NOT DETECTED NOT DETECTED Final   Candida albicans NOT DETECTED NOT DETECTED Final   Candida auris NOT DETECTED NOT DETECTED Final   Candida glabrata NOT DETECTED NOT DETECTED Final   Candida krusei NOT DETECTED NOT DETECTED Final   Candida parapsilosis NOT DETECTED NOT DETECTED Final   Candida tropicalis NOT DETECTED NOT DETECTED Final   Cryptococcus neoformans/gattii NOT DETECTED NOT DETECTED Final    CTX-M ESBL NOT DETECTED NOT DETECTED Final   Carbapenem resistance IMP NOT DETECTED NOT DETECTED Final   Carbapenem resistance KPC NOT DETECTED NOT DETECTED Final   Carbapenem resistance NDM NOT DETECTED NOT DETECTED Final   Carbapenem resist OXA 48 LIKE NOT DETECTED NOT DETECTED Final   Carbapenem resistance VIM NOT DETECTED NOT DETECTED Final    Comment: Performed at [facility], [address] [place], [place] [address]  Resp panel by RT-PCR (RSV, Flu A&B, Covid) Anterior Nasal Swab     Status: None   Collection Time: 12/24/22  1:53 AM   Specimen: Anterior Nasal Swab  Result Value Ref Range Status   SARS Coronavirus 2 by RT PCR NEGATIVE NEGATIVE Final    Comment: (NOTE) SARS-CoV-2 target nucleic acids are NOT DETECTED.  The SARS-CoV-2 RNA is generally detectable in upper respiratory specimens during the acute phase of infection. The lowest concentration of SARS-CoV-2 viral copies this assay can detect is 138 copies/mL. A negative result does not preclude SARS-Cov-2 infection and should not be used as the sole basis for treatment or other patient management decisions. A negative result may occur with  improper specimen collection/handling, submission of specimen other than nasopharyngeal swab, presence of viral mutation(s) within the areas targeted by this assay, and inadequate number of viral copies(<138 copies/mL). A negative result must be combined with clinical observations, patient history, and epidemiological information. The expected result is Negative.  Fact Sheet for Patients:  BloggerCourse.com  Fact Sheet for Healthcare Providers:  SeriousBroker.it  This test is no t yet approved or cleared by the [place] FDA and  has been authorized for detection and/or diagnosis of SARS-CoV-2 by FDA under an Emergency Use Authorization (EUA). This EUA will remain  in effect (meaning this test can be used) for the  duration of the COVID-19 declaration under Section 564(b)(1) of the Act, 21 U.S.C.section 360bbb-3(b)(1), unless the  authorization is terminated  or revoked sooner.       Influenza A by PCR NEGATIVE NEGATIVE Final   Influenza B by PCR NEGATIVE NEGATIVE Final    Comment: (NOTE) The Xpert Xpress SARS-CoV-2/FLU/RSV plus assay is intended as an aid in the diagnosis of influenza from Nasopharyngeal swab specimens and should not be used as a sole basis for treatment. Nasal washings and aspirates are unacceptable for Xpert Xpress SARS-CoV-2/FLU/RSV testing.  Fact Sheet for Patients: BloggerCourse.com  Fact Sheet for Healthcare Providers: SeriousBroker.it  This test is not yet approved or cleared by the [place] FDA and has been authorized for detection and/or diagnosis of SARS-CoV-2 by FDA under an Emergency Use Authorization (EUA). This EUA will remain in effect (meaning this test can be used) for the duration of the COVID-19 declaration under Section 564(b)(1) of the Act, 21 U.S.C. section 360bbb-3(b)(1), unless the authorization is terminated or revoked.     Resp Syncytial Virus by PCR NEGATIVE NEGATIVE Final    Comment: (NOTE) Fact Sheet for Patients: BloggerCourse.com  Fact Sheet for Healthcare Providers: SeriousBroker.it  This test is not yet approved or cleared by the [place] FDA and has been authorized for detection and/or diagnosis of SARS-CoV-2 by FDA under an Emergency Use Authorization (EUA). This EUA will remain in effect (meaning this test can be used) for the duration of the COVID-19 declaration under Section 564(b)(1) of the Act, 21 U.S.C. section 360bbb-3(b)(1), unless the authorization is terminated or revoked.  Performed at [facility], [address], [place], [place] [address]          Radiology Studies: CT ABDOMEN  PELVIS W CONTRAST Result Date: 12/24/2022 CLINICAL DATA:  47 year old with metastatic colon cancer presents with abdominal pain for 3 days. History of pancreatic stent. EXAM: CT ABDOMEN AND PELVIS WITH CONTRAST TECHNIQUE: Multidetector CT imaging of the abdomen and pelvis was performed using the standard protocol following bolus administration of intravenous contrast. RADIATION DOSE REDUCTION: This exam was performed according to the departmental dose-optimization program which includes automated exposure control, adjustment of the mA and/or kV according to patient size and/or use of iterative reconstruction technique. CONTRAST:  OMNIPAQUE IOHEXOL 300 MG/ML  SOLN COMPARISON:  CTs with IV contrast 12/15/2022 and 11/19/2022. FINDINGS: Lower chest: No acute abnormality. Hepatobiliary: Stenting again noted of the left and right hepatic ducts extending down the common bile duct into the duodenum terminating in the horizontal duodenum to the left. Intrahepatic biliary dilatation, greater in the left lobe is similar to the last CT but increased since 11/19/2022. The greatest dilatation is in the central bile ducts which demonstrate wall thickening near the hepatic hilum which could be related to cholangitis or infiltrating disease. This was present on both prior studies. Hepatic parenchymal enhancement is more homogeneous than on the last study possible bolus phase related. Major hepatic vascular structures are patent. There is moderately dense material in gallbladder consistent with vicarious contrast excretion or dense sludge. No stones, wall thickening or pericholecystic fluid are seen. There is a 2.1 cm heterogeneous mass in the lateral aspect of left hepatic segment 2, again most likely representing a metastasis and not seen on 11/19/2022. Focal periligamentous fat is seen in segment 4 B. in segment 5 there is a potentially new 5 mm hypodensity on 2:24 which could be a small metastasis. In segment 8 there is a  4 mm chronic cyst on 2:14. No other visible focal liver abnormality. Pancreas: Slightly dilated proximal pancreatic duct 4 mm, unchanged.  No further abnormality. Spleen: No abnormality. Adrenals/Urinary Tract: There is no adrenal mass, no renal mass enhancement. 1.9 cm Bosniak 1 cyst in the posterior left kidney measures 11 Hounsfield units. There is bilateral nephrolithiasis. Largest stone on the left is in the upper pole measuring 1.1 cm, largest on the right is in the lower pole and measures 8 mm. There is cortical scarring in the right lower pole but no mass enhancement of either kidney. No obstructing stones or hydronephrosis.  Unremarkable bladder. Stomach/Bowel: There are thickened folds in the stomach. Thickened folds in the distal duodenum and jejunum consistent with gastroenteritis. No small bowel dilatation or inflammatory changes seen. The appendix is normal. There are increasingly thickened folds in the transverse colon. Given the short interval timeframe of the appearance of this, inflammatory or infectious etiology such as colitis is more likely than infiltrating disease. There is moderate thickening in the presacral rectum again noted consistent with neoplasm or radiation changes. There is mild-to-moderate fecal stasis in the remaining large bowel. Vascular/Lymphatic: Aortic atherosclerosis. No enlarged abdominal or pelvic lymph nodes. Reproductive: Uterus and bilateral adnexa are unremarkable. Other: Multiple peritoneal/omental implants. Examples include a solid nodule in mid left paracolic gutter measuring 1.7 x 1.4 cm on 2:38, scattered smaller nodules distal to this in the more distal gutter, omental implant in the anterior right lower abdomen measuring 1.7 x 1.3 cm on 2:49, and an omental nodule right of the midline in the mid abdomen measuring 1.5 x 1.7 on 2: 42. These omental implants are not notably changed since 9 days ago but are slightly larger than on 11/19/2022. There is a small volume  of pelvic ascites the left. There is no free air. 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 No bone metastasis is seen. IMPRESSION: 1. Increasingly thickened folds in the transverse colon, more likely to be infectious or inflammatory than neoplastic. 2. Gastroenteritis. 3. 2.1 cm heterogeneous mass in the lateral aspect of left hepatic segment 2, most likely a metastasis and not seen on 11/19/2022. 4. Potentially new 5 mm hypodensity in segment 5 which could be a small metastasis. 5. Stable biliary stents with intrahepatic biliary dilatation, greater in the left lobe, similar to the last CT but increased since 11/19/2022. 6. Central biliary wall thickening near the hepatic hilum which could be related to cholangitis or infiltrating disease. 7. Multiple peritoneal/omental implants, not notably changed since 9 days ago but slightly larger than on 11/19/2022. 8. Small volume of pelvic ascites. 9. Constipation. 10. Aortic atherosclerosis. 11. Bilateral nephrolithiasis. 12. 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 Aortic Atherosclerosis (ICD10-I70.0). Electronically Signed   By: Almira Bar M.D.   On: 12/24/2022 04:56   DG Chest Port 1 View Result Date: 12/24/2022 CLINICAL DATA:  Colon cancer, nausea, diarrhea, possible sepsis EXAM: PORTABLE CHEST 1 VIEW COMPARISON:  CT chest dated 12/15/2022 FINDINGS: Lungs are clear.  No pleural effusion or pneumothorax. The heart is normal in size. IMPRESSION: No acute cardiopulmonary disease. Electronically Signed   By: Charline Bills M.D.   On: 12/24/2022 01:09        Scheduled Meds:  amLODipine  10 mg Oral Daily   Chlorhexidine Gluconate Cloth  6 each Topical Daily   enoxaparin (LOVENOX) injection  40 mg Subcutaneous Q24H    metoprolol tartrate  50 mg Oral BID  sodium chloride flush  10-40 mL Intracatheter Q12H   Continuous Infusions:  cefTRIAXone (ROCEPHIN)  IV 2 g (12/24/22 2111)          Glade Lloyd, MD [facility] 12/25/2022, 10:35 AM

## 2022-12-25 NOTE — Plan of Care (Signed)

## 2022-12-25 NOTE — Consult Note (Signed)
Referring Provider: Dr. Hanley Ben, [facility] Primary Care Physician:  Early, Sung Amabile, NP Primary Gastroenterologist:  ? Dr. Adela Lank  Reason for Consultation:  Klebsiella bacteremia  HPI: April [name] is a 47 y.o. female with medical history significant of hypertension, nephrolithiasis, emphysema, stage IV colon cancer and chronic cancer-related pain, malignant biliary stricture status post stenting, ascending aortic aneurysm and recent admission and discharge from 12/16/2022 to 12/18/2022 for acute submandibular sialadenitis initially treated with IV antibiotics and subsequently discharged home on oral antibiotics.  She presented back again with worsening abdominal pain, nausea, vomiting, diarrhea and fever.  On presentation, she was febrile 102.2, tachypneic, tachycardic.  WBC of 10.7; negative COVID-19/influenza/RSV PCR.  Alk phos 293, which is up from last week, but downtrending this hospital stay.  Total bili is normal.  Blood cultures positive for Klebsiella.  Has been treated with Rocephin.  CT scan of the abdomen and pelvis with contrast:  IMPRESSION: 1. Increasingly thickened folds in the transverse colon, more likely to be infectious or inflammatory than neoplastic. 2. Gastroenteritis. 3. 2.1 cm heterogeneous mass in the lateral aspect of left hepatic segment 2, most likely a metastasis and not seen on 11/19/2022. 4. Potentially new 5 mm hypodensity in segment 5 which could be a small metastasis. 5. Stable biliary stents with intrahepatic biliary dilatation, greater in the left lobe, similar to the last CT but increased since 11/19/2022. 6. Central biliary wall thickening near the hepatic hilum which could be related to cholangitis or infiltrating disease. 7. Multiple peritoneal/omental implants, not notably changed since 9 days ago but slightly larger than on 11/19/2022. 8. Small volume of pelvic ascites. 9. Constipation. 10. Aortic atherosclerosis. 11. Bilateral  nephrolithiasis. 12. 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   Aortic Atherosclerosis (ICD10-I70.0).  She is feeling much better.  Eating a hamburger and [place] fries when I saw her today.  No further diarrhea actually in like 3 days she tells me.  No further nausea or vomiting.    Past Medical History:  Diagnosis Date   Benign essential hypertension 10/06/2017   Last Assessment & Plan:  Formatting of this note might be different from the original. Patient's blood pressure noted to be elevated at today's visit 188/98.  Patient will be provided with antihypertensives in the treatment center if needed in order to meet her Avastin parameters.  Patient recommended to follow up with her primary care physician regarding medication management.  Patient is not cur   Cancer [facility])    Colon cancer (HCC)    Hypertension    Renal calculi    bilateral    Past Surgical History:  Procedure Laterality Date   BILIARY BRUSHING  03/24/2022   Procedure: BILIARY BRUSHING;  Surgeon: Meridee Score Netty Starring., MD;  Location: [facility] ENDOSCOPY;  Service: Gastroenterology;;   BILIARY DILATION  03/24/2022   Procedure: BILIARY DILATION;  Surgeon: Lemar Lofty., MD;  Location: [facility] ENDOSCOPY;  Service: Gastroenterology;;   BILIARY STENT PLACEMENT  03/24/2022   Procedure: BILIARY STENT PLACEMENT;  Surgeon: Lemar Lofty., MD;  Location: [facility] ENDOSCOPY;  Service: Gastroenterology;;   BIOPSY  03/24/2022   Procedure: BIOPSY;  Surgeon: Lemar Lofty., MD;  Location: [facility] ENDOSCOPY;  Service: Gastroenterology;;   BIOPSY  11/23/2022   Procedure: BIOPSY;  Surgeon: Hilarie Fredrickson, MD;  Location: [facility] ENDOSCOPY;  Service: Gastroenterology;;   CESAREAN SECTION     x2   COLONOSCOPY N/A 11/23/2022   Procedure: COLONOSCOPY;  Surgeon: Hilarie Fredrickson, MD;  Location: [facility] ENDOSCOPY;  Service: Gastroenterology;  Laterality: N/A;   CYSTOSCOPY  WITH RETROGRADE PYELOGRAM, URETEROSCOPY AND STENT PLACEMENT Right 10/22/2020   Procedure: CYSTOSCOPY WITH RETROGRADE PYELOGRAM, URETEROSCOPY AND STENT PLACEMENT;  Surgeon: Noel Christmas, MD;  Location: [facility] ORS;  Service: Urology;  Laterality: Right;   ERCP N/A 03/24/2022   Procedure: ENDOSCOPIC RETROGRADE CHOLANGIOPANCREATOGRAPHY (ERCP);  Surgeon: Lemar Lofty., MD;  Location: [facility] ENDOSCOPY;  Service: Gastroenterology;  Laterality: N/A;   IR PATIENT EVAL TECH 0-60 MINS  12/15/2022   IR RADIOLOGIST EVAL & MGMT  09/16/2019   IR RADIOLOGIST EVAL & MGMT  09/23/2019   IR RADIOLOGIST EVAL & MGMT  09/20/2019   IR RADIOLOGIST EVAL & MGMT  09/30/2019   IR REMOVAL TUN ACCESS W/ PORT W/O FL MOD SED  09/14/2019   IR VENO/EXT/UNI RIGHT  04/11/2022   PANCREATIC STENT PLACEMENT  03/24/2022   Procedure: PANCREATIC STENT PLACEMENT;  Surgeon: Lemar Lofty., MD;  Location: [facility] ENDOSCOPY;  Service: Gastroenterology;;   REMOVAL OF STONES  03/24/2022   Procedure: REMOVAL OF STONES;  Surgeon: Lemar Lofty., MD;  Location: [facility] ENDOSCOPY;  Service: Gastroenterology;;   Dennison Mascot  03/24/2022   Procedure: Dennison Mascot;  Surgeon: Lemar Lofty., MD;  Location: [facility] ENDOSCOPY;  Service: Gastroenterology;;   URETERAL STENT PLACEMENT      Prior to Admission medications   Medication Sig Start Date End Date Taking? Authorizing Provider  acetaminophen (TYLENOL) 500 MG tablet Take 1,000 mg by mouth as needed for moderate pain (pain score 4-6).   Yes [provider]  amLODipine (NORVASC) 10 MG tablet Take 1 tablet (10 mg total) by mouth daily. 11/24/22  Yes Rodolph Bong, MD  amoxicillin-clavulanate (AUGMENTIN) 875-125 MG tablet Take 1 tablet by mouth 2 (two) times daily.   Yes [provider]  chlorthalidone (HYGROTON) 25 MG tablet Take 1 tablet (25 mg total) by mouth daily. 11/24/22  Yes Rodolph Bong, MD  dicyclomine (BENTYL) 20 MG tablet Take 1 tablet (20 mg total)  by mouth 3 (three) times daily as needed for spasms (rectal/AB pain). Patient taking differently: Take 20 mg by mouth daily. 11/24/22  Yes Rodolph Bong, MD  HYDROcodone-acetaminophen [facility]) 10-325 MG tablet Take 1 tablet by mouth every 6 (six) hours as needed. Patient taking differently: Take 2 tablets by mouth every 4 (four) hours as needed for moderate pain (pain score 4-6) or severe pain (pain score 7-10). 12/22/22  Yes Ladene Artist, MD  metoprolol tartrate (LOPRESSOR) 50 MG tablet Take 1 tablet (50 mg total) by mouth 2 (two) times daily. 11/24/22  Yes Rodolph Bong, MD  mometasone-formoterol [facility]) 200-5 MCG/ACT AERO Inhale 2 puffs into the lungs 2 (two) times daily. 11/24/22  Yes Rodolph Bong, MD  morphine (MS CONTIN) 15 MG 12 hr tablet Take 1 tablet (15 mg total) by mouth every 12 (twelve) hours. Patient taking differently: Take 15 mg by mouth 2 (two) times daily as needed for pain. 12/03/22  Yes Rana Snare, NP  polyethylene glycol powder (MIRALAX) 17 GM/SCOOP powder Take 17 g by mouth 2 (two) times daily. 11/24/22  Yes Rodolph Bong, MD  potassium chloride (MICRO-K) 10 MEQ CR capsule Take 2 capsules (20 mEq total) by mouth 2 (two) times daily. 11/24/22  Yes Rodolph Bong, MD  umeclidinium bromide (INCRUSE ELLIPTA) 62.5 MCG/ACT AEPB Inhale 1 puff into the lungs daily. 11/25/22  Yes Rodolph Bong, MD  capecitabine (XELODA) 500  MG tablet Take by mouth 2 (two) times daily after a meal. Patient not taking: Reported on 12/24/2022    [provider]    Current Facility-Administered Medications  Medication Dose Route Frequency Provider Last Rate Last Admin   amLODipine (NORVASC) tablet 10 mg  10 mg Oral Daily Hanley Ben, Kshitiz, MD   10 mg at 12/25/22 0950   cefTRIAXone (ROCEPHIN) 2 g in sodium chloride 0.9 % 100 mL IVPB  2 g Intravenous Q24H Bobette Mo, MD 200 mL/hr at 12/24/22 2111 2 g at 12/24/22 2111   Chlorhexidine Gluconate Cloth 2 %  PADS 6 each  6 each Topical Daily Bobette Mo, MD   6 each at 12/25/22 1610   dicyclomine (BENTYL) tablet 20 mg  20 mg Oral TID PRN Glade Lloyd, MD       enoxaparin (LOVENOX) injection 40 mg  40 mg Subcutaneous Q24H Bobette Mo, MD   40 mg at 12/24/22 2105   HYDROmorphone (DILAUDID) injection 1 mg  1 mg Intravenous Q3H PRN Bobette Mo, MD   1 mg at 12/25/22 1125   metoprolol tartrate (LOPRESSOR) tablet 50 mg  50 mg Oral BID Glade Lloyd, MD   50 mg at 12/25/22 0950   morphine (MS CONTIN) 12 hr tablet 15 mg  15 mg Oral Q12H Alekh, Kshitiz, MD   15 mg at 12/25/22 1302   prochlorperazine (COMPAZINE) injection 10 mg  10 mg Intravenous Q6H PRN Bobette Mo, MD   10 mg at 12/25/22 9604   sodium chloride flush (NS) 0.9 % injection 10-40 mL  10-40 mL Intracatheter Q12H Bobette Mo, MD   20 mL at 12/24/22 2106   sodium chloride flush (NS) 0.9 % injection 10-40 mL  10-40 mL Intracatheter PRN Bobette Mo, MD        Allergies as of 12/24/2022 - Review Complete 12/24/2022  Allergen Reaction Noted   Lisinopril Swelling and Other (See Comments) 07/21/2019   Irinotecan Other (See Comments) 10/03/2020   Leucovorin calcium Itching 08/04/2022   Oxaliplatin Itching 08/04/2022    Family History  Problem Relation Age of Onset   Hypertension Mother    Diabetes Mellitus II Father    Stroke Father    Ulcers Sister    Colon cancer Neg Hx    Esophageal cancer Neg Hx    Inflammatory bowel disease Neg Hx    Liver disease Neg Hx    Pancreatic cancer Neg Hx    Rectal cancer Neg Hx    Stomach cancer Neg Hx     Social History   Socioeconomic History   Marital status: Single    Spouse name: Not on file   Number of children: Not on file   Years of education: Not on file   Highest education level: Not on file  Occupational History   Not on file  Tobacco Use   Smoking status: Former    Types: Cigarettes   Smokeless tobacco: Never   Tobacco comments:     Quit 1 month ago   Vaping Use   Vaping status: Never Used  Substance and Sexual Activity   Alcohol use: Not Currently   Drug use: Not Currently    Types: Hydrocodone   Sexual activity: Not Currently  Other Topics Concern   Not on file  Social History Narrative   Not on file   Social Drivers of Health   Financial Resource Strain: Low Risk  (11/11/2022)   Overall Financial Resource Strain (CARDIA)  Difficulty of Paying Living Expenses: Not hard at all  Food Insecurity: No Food Insecurity (12/24/2022)   Hunger Vital Sign    Worried About Running Out of Food in the Last Year: Never true    Ran Out of Food in the Last Year: Never true  Recent Concern: Food Insecurity - Food Insecurity Present (11/11/2022)   Hunger Vital Sign    Worried About Running Out of Food in the Last Year: Sometimes true    Ran Out of Food in the Last Year: Sometimes true  Transportation Needs: No Transportation Needs (12/24/2022)   PRAPARE - Administrator, Civil Service (Medical): No    Lack of Transportation (Non-Medical): No  Recent Concern: Transportation Needs - Unmet Transportation Needs (11/11/2022)   PRAPARE - Transportation    Lack of Transportation (Medical): Yes    Lack of Transportation (Non-Medical): Yes  Physical Activity: Inactive (11/11/2022)   Exercise Vital Sign    Days of Exercise per Week: 0 days    Minutes of Exercise per Session: 0 min  Stress: No Stress Concern Present (11/11/2022)   [employer] of Occupational Health - Occupational Stress Questionnaire    Feeling of Stress : Not at all  Social Connections: Socially Isolated (11/11/2022)   Social Connection and Isolation Panel [NHANES]    Frequency of Communication with Friends and Family: More than three times a week    Frequency of Social Gatherings with Friends and Family: More than three times a week    Attends Religious Services: Never    Database administrator or Organizations: No    Attends Tax inspector Meetings: Never    Marital Status: Never married  Intimate Partner Violence: Not At Risk (12/24/2022)   Humiliation, Afraid, Rape, and Kick questionnaire    Fear of Current or Ex-Partner: No    Emotionally Abused: No    Physically Abused: No    Sexually Abused: No    Review of Systems: ROS is O/W negative except as mentioned in HPI.  Physical Exam: Vital signs in last 24 hours: Temp:  [97.7 F (36.5 C)-98.5 F (36.9 C)] 97.7 F (36.5 C) (12/12 0427) Pulse Rate:  [83-92] 92 (12/12 0427) Resp:  [17-18] 17 (12/12 0427) BP: (146-158)/(108-113) 146/112 (12/12 0427) SpO2:  [100 %] 100 % (12/12 0427) Last BM Date : 12/24/22 General:  Alert, Well-developed, well-nourished, pleasant and cooperative in NAD Head:  Normocephalic and atraumatic. Eyes:  Sclera clear, no icterus.  Conjunctiva pink. Ears:  Normal auditory acuity. Mouth:  No deformity or lesions.   Lungs:  Clear throughout to auscultation.  No wheezes, crackles, or rhonchi.  Heart:  Regular rate and rhythm; no murmurs, clicks, rubs, or gallops. Abdomen:  Soft, non-distended.  BS present.  No specific TTP. Msk:  Symmetrical without gross deformities. Pulses:  Normal pulses noted. Extremities:  Without clubbing or edema. Neurologic:  Alert and oriented x 4;  grossly normal neurologically. Skin:  Intact without significant lesions or rashes. Psych:  Alert and cooperative. Normal mood and affect.  Intake/Output from previous day: 12/11 0701 - 12/12 0700 In: 1060 [P.O.:360; IV Piggyback:700] Out: -   Lab Results: Recent Labs    12/24/22 0049 12/25/22 0426  WBC 10.7* 6.2  HGB 13.5 11.2*  HCT 39.7 33.4*  PLT 318 259   BMET Recent Labs    12/24/22 0049 12/24/22 0803 12/25/22 0426  NA 132* 134* 133*  K 2.7* 2.9* 3.6  CL 93* 97* 99  CO2 26 27  26  GLUCOSE 109* 98 87  BUN 8 8 6   CREATININE 0.49 0.43* 0.40*  CALCIUM 9.3 8.6* 8.6*   LFT Recent Labs    12/25/22 0426  PROT 7.1  ALBUMIN 3.1*  AST  47*  ALT 43  ALKPHOS 293*  BILITOT 0.5   PT/INR Recent Labs    12/24/22 0050  LABPROT 13.3  INR 1.0   Studies/Results: CT ABDOMEN PELVIS W CONTRAST Result Date: 12/24/2022 CLINICAL DATA:  47 year old with metastatic colon cancer presents with abdominal pain for 3 days. History of pancreatic stent. EXAM: CT ABDOMEN AND PELVIS WITH CONTRAST TECHNIQUE: Multidetector CT imaging of the abdomen and pelvis was performed using the standard protocol following bolus administration of intravenous contrast. RADIATION DOSE REDUCTION: This exam was performed according to the departmental dose-optimization program which includes automated exposure control, adjustment of the mA and/or kV according to patient size and/or use of iterative reconstruction technique. CONTRAST:  OMNIPAQUE IOHEXOL 300 MG/ML  SOLN COMPARISON:  CTs with IV contrast 12/15/2022 and 11/19/2022. FINDINGS: Lower chest: No acute abnormality. Hepatobiliary: Stenting again noted of the left and right hepatic ducts extending down the common bile duct into the duodenum terminating in the horizontal duodenum to the left. Intrahepatic biliary dilatation, greater in the left lobe is similar to the last CT but increased since 11/19/2022. The greatest dilatation is in the central bile ducts which demonstrate wall thickening near the hepatic hilum which could be related to cholangitis or infiltrating disease. This was present on both prior studies. Hepatic parenchymal enhancement is more homogeneous than on the last study possible bolus phase related. Major hepatic vascular structures are patent. There is moderately dense material in gallbladder consistent with vicarious contrast excretion or dense sludge. No stones, wall thickening or pericholecystic fluid are seen. There is a 2.1 cm heterogeneous mass in the lateral aspect of left hepatic segment 2, again most likely representing a metastasis and not seen on 11/19/2022. Focal periligamentous fat is  seen in segment 4 B. in segment 5 there is a potentially new 5 mm hypodensity on 2:24 which could be a small metastasis. In segment 8 there is a 4 mm chronic cyst on 2:14. No other visible focal liver abnormality. Pancreas: Slightly dilated proximal pancreatic duct 4 mm, unchanged. No further abnormality. Spleen: No abnormality. Adrenals/Urinary Tract: There is no adrenal mass, no renal mass enhancement. 1.9 cm Bosniak 1 cyst in the posterior left kidney measures 11 Hounsfield units. There is bilateral nephrolithiasis. Largest stone on the left is in the upper pole measuring 1.1 cm, largest on the right is in the lower pole and measures 8 mm. There is cortical scarring in the right lower pole but no mass enhancement of either kidney. No obstructing stones or hydronephrosis.  Unremarkable bladder. Stomach/Bowel: There are thickened folds in the stomach. Thickened folds in the distal duodenum and jejunum consistent with gastroenteritis. No small bowel dilatation or inflammatory changes seen. The appendix is normal. There are increasingly thickened folds in the transverse colon. Given the short interval timeframe of the appearance of this, inflammatory or infectious etiology such as colitis is more likely than infiltrating disease. There is moderate thickening in the presacral rectum again noted consistent with neoplasm or radiation changes. There is mild-to-moderate fecal stasis in the remaining large bowel. Vascular/Lymphatic: Aortic atherosclerosis. No enlarged abdominal or pelvic lymph nodes. Reproductive: Uterus and bilateral adnexa are unremarkable. Other: Multiple peritoneal/omental implants. Examples include a solid nodule in mid left paracolic gutter measuring 1.7 x 1.4 cm on  2:38, scattered smaller nodules distal to this in the more distal gutter, omental implant in the anterior right lower abdomen measuring 1.7 x 1.3 cm on 2:49, and an omental nodule right of the midline in the mid abdomen measuring 1.5 x  1.7 on 2: 42. These omental implants are not notably changed since 9 days ago but are slightly larger than on 11/19/2022. There is a small volume of pelvic ascites the left. There is no free air. 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 No bone metastasis is seen. IMPRESSION: 1. Increasingly thickened folds in the transverse colon, more likely to be infectious or inflammatory than neoplastic. 2. Gastroenteritis. 3. 2.1 cm heterogeneous mass in the lateral aspect of left hepatic segment 2, most likely a metastasis and not seen on 11/19/2022. 4. Potentially new 5 mm hypodensity in segment 5 which could be a small metastasis. 5. Stable biliary stents with intrahepatic biliary dilatation, greater in the left lobe, similar to the last CT but increased since 11/19/2022. 6. Central biliary wall thickening near the hepatic hilum which could be related to cholangitis or infiltrating disease. 7. Multiple peritoneal/omental implants, not notably changed since 9 days ago but slightly larger than on 11/19/2022. 8. Small volume of pelvic ascites. 9. Constipation. 10. Aortic atherosclerosis. 11. Bilateral nephrolithiasis. 12. 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 Aortic Atherosclerosis (ICD10-I70.0). Electronically Signed   By: Almira Bar M.D.   On: 12/24/2022 04:56   DG Chest Port 1 View Result Date: 12/24/2022 CLINICAL DATA:  Colon cancer, nausea, diarrhea, possible sepsis EXAM: PORTABLE CHEST 1 VIEW COMPARISON:  CT chest dated 12/15/2022 FINDINGS: Lungs are clear.  No pleural effusion or pneumothorax. The heart is normal in size. IMPRESSION: No acute cardiopulmonary disease. Electronically Signed   By: Charline Bills M.D.   On: 12/24/2022 01:09    IMPRESSION:  *Klebsiella  bacteremia: Patient presented with diarrhea, nausea, vomiting, fever, and CT scan suggesting gastroenteritis/colitis.  Patient on Rocephin and is improving significantly. *Stage IV colon cancer with metastatic disease to the liver and history of malignant biliary stricture status post biliary stenting: LFTs/alk phos up slightly, but downtrending now this hospital stay.  CT scan of the biliary system not significantly changed.  PLAN: -Discussed with Dr. Meridee Score.  Patient is improving with treatment of antibiotics, Rocephin.  Recommend continuing treatment for gastroenteritis/colitis as source of her bacteremia.  If she continues with bacteremia despite this then may need to consider stent exchange/ERCP, but will hold off on that for now.  She has an OV with Dr. Meridee Score next week.   Princella Pellegrini. Falyn Rubel  12/25/2022, 1:23 PM

## 2022-12-25 NOTE — Progress Notes (Signed)
Assumed patient care from RN Danielle. Agreed with the previous assessment. Will continue to monitor

## 2022-12-25 NOTE — Progress Notes (Addendum)
April [name]   DOB:1976-01-11   NA#:[id number]      ASSESSMENT & PLAN:  Sigmoid colon cancer, stage IV (pT4a,pN2b,M1c)  -Intramucosal adenocarcinoma, diagnosed 2021 -Status post FOLFIRI/bevacizumab held, Udenyca given in 2021.  From 20 22-20 23 FOLFIRI with panitumumab was given.   -Patient went on to have maintenance Xeloda in 2022.   -Lonsurf was given in 2023. -More recently in 2024 patient has received FOLFOX therapy. -CT abdomen pelvis done 12/24/2022 showed 2.1 cm heterogeneous mass left hepatic close, likely mets not previously seen on scan 11/19/2022.  Also shows multiple peritoneal/omental implants. -GI has been consulted, may need stent exchange -Patient was scheduled to have salvage therapy with irinotecan/panitumumab on 12/29/2022.  However has been admitted with high fever, likely biliary sepsis and sepsis.   -Patient states that she has rescheduled with [facility] on 01/08/2023 for clinical trials. -Our plan is to begin systemic therapy when she has recovered from this current infection.  2.  Nausea/vomiting/chills-  -Patient admitted yesterday 12/24/2022 with complaints of nausea, vomiting, chills.  Reports feeling much better now although still with slight abdominal pain. -Blood culture positive for Klebsiella  3.  Abdominal pain -Secondary to metastatic colon cancer -Provide judicious pain management -Monitor closely  4.  Peripheral neuropathy -Likely due to previous chemotherapy treatments -Continue to monitor  Code Status Full   Discharge planning: To home when medically optimized  Subjective:  This is a very pleasant female patient 47 years old who was admitted with complaints of progressively worsening nausea, tea colored vomitus, chills, and abdominal pain.  Reports she is feeling much better at this time.  Denies headaches, chest pain, upper and lower extremity edema.  Admits to still having slight dizziness which is intermittent.  Patient is looking forward  to being discharged.  Objective:  Vitals:   12/25/22 0427 12/25/22 1359  BP: (!) 146/112 (!) 151/115  Pulse: 92 98  Resp: 17   Temp: 97.7 F (36.5 C) 98.9 F (37.2 C)  SpO2: 100% 96%     Intake/Output Summary (Last 24 hours) at 12/25/2022 1541 Last data filed at 12/25/2022 5427 Gross per 24 hour  Intake 830 ml  Output --  Net 830 ml     REVIEW OF SYSTEMS:   Constitutional: +fatigue, Denies fevers, chills or abnormal night sweats Eyes: Denies blurriness of vision, double vision or watery eyes Ears, nose, mouth, throat, and face: Denies mucositis or sore throat Respiratory: Denies cough, dyspnea or wheezes Cardiovascular: Denies palpitation, chest discomfort or lower extremity swelling Gastrointestinal:  +nausea, heartburn or change in bowel habits Skin: Denies abnormal skin rashes Lymphatics: Denies new lymphadenopathy or easy bruising Neurological: +numbness bil lower extremities  Behavioral/Psych: Mood is stable, no new changes  All other systems were reviewed with the patient and are negative.  PHYSICAL EXAMINATION: ECOG PERFORMANCE STATUS: 1 - Symptomatic but completely ambulatory  Vitals:   12/25/22 0427 12/25/22 1359  BP: (!) 146/112 (!) 151/115  Pulse: 92 98  Resp: 17   Temp: 97.7 F (36.5 C) 98.9 F (37.2 C)  SpO2: 100% 96%   Filed Weights   12/24/22 0020  Weight: 141 lb 11.2 oz (64.3 kg)    GENERAL: alert, no distress and comfortable SKIN: skin color, texture, turgor are normal, no rashes or significant lesions EYES: normal, conjunctiva are pink and non-injected, sclera clear OROPHARYNX: no exudate, no erythema and lips, buccal mucosa, and tongue normal  NECK: supple, thyroid normal size, non-tender, without nodularity LYMPH: no palpable lymphadenopathy in the cervical, axillary or  inguinal LUNGS: clear to auscultation and percussion with normal breathing effort HEART: regular rate & rhythm and no murmurs and no lower extremity edema ABDOMEN:  abdomen soft, non-tender and normal bowel sounds MUSCULOSKELETAL: no cyanosis of digits and no clubbing  PSYCH: alert & oriented x 3 with fluent speech NEURO: no focal motor/sensory deficits   All questions were answered. The patient knows to call the clinic with any problems, questions or concerns.   The total time spent in the appointment was 30 minutes encounter with patient including review of chart and various tests results, discussions about plan of care and coordination of care plan  Dawson Bills, NP 12/25/2022 3:41 PM    Labs Reviewed:  Lab Results  Component Value Date   WBC 6.2 12/25/2022   HGB 11.2 (L) 12/25/2022   HCT 33.4 (L) 12/25/2022   MCV 91.8 12/25/2022   PLT 259 12/25/2022   Recent Labs    03/27/22 1212 03/31/22 0945 11/21/22 0557 11/22/22 0710 12/16/22 0505 12/17/22 0500 12/22/22 0821 12/24/22 0049 12/24/22 0803 12/25/22 0426  NA  --    < > 138   < > 132*   < > 136 132* 134* 133*  K 3.8   < > 3.5   < > 2.9*   < > 3.3* 2.7* 2.9* 3.6  CL  --    < > 101   < > 97*   < > 95* 93* 97* 99  CO2  --    < > 24   < > 29   < > 31 26 27 26   GLUCOSE  --    < > 139*   < > 97   < > 111* 109* 98 87  BUN  --    < > 9   < > 8   < > 16 8 8 6   CREATININE  --    < > 0.58   < > 0.52   < > 0.63 0.49 0.43* 0.40*  CALCIUM  --    < > 10.3   < > 8.5*   < > 10.0 9.3 8.6* 8.6*  GFRNONAA  --    < > >60   < > >60   < > >60 >60 >60 >60  PROT 6.8   < > 8.9*   < > 7.1  --  8.4* 8.4*  --  7.1  ALBUMIN 3.2*   < > 4.4  4.5   < > 3.2*  --  4.3 3.8  --  3.1*  AST 78*   < > 35   < > 37  --  47* 47*  --  47*  ALT 172*   < > 24   < > 28  --  64* 55*  --  43  ALKPHOS 506*   < > 172*   < > 204*  --  368* 354*  --  293*  BILITOT 2.3*   < > 0.6   < > 0.5  --  0.2 0.5  --  0.5  BILIDIR 1.1*  --  <0.1  --  0.1  --   --   --   --   --   IBILI 1.2*  --  NOT CALCULATED  --  0.4  --   --   --   --   --    < > = values in this interval not displayed.    Studies Reviewed:  CT ABDOMEN PELVIS W  CONTRAST Result Date: 12/24/2022  CLINICAL DATA:  47 year old with metastatic colon cancer presents with abdominal pain for 3 days. History of pancreatic stent. EXAM: CT ABDOMEN AND PELVIS WITH CONTRAST TECHNIQUE: Multidetector CT imaging of the abdomen and pelvis was performed using the standard protocol following bolus administration of intravenous contrast. RADIATION DOSE REDUCTION: This exam was performed according to the departmental dose-optimization program which includes automated exposure control, adjustment of the mA and/or kV according to patient size and/or use of iterative reconstruction technique. CONTRAST:  OMNIPAQUE IOHEXOL 300 MG/ML  SOLN COMPARISON:  CTs with IV contrast 12/15/2022 and 11/19/2022. FINDINGS: Lower chest: No acute abnormality. Hepatobiliary: Stenting again noted of the left and right hepatic ducts extending down the common bile duct into the duodenum terminating in the horizontal duodenum to the left. Intrahepatic biliary dilatation, greater in the left lobe is similar to the last CT but increased since 11/19/2022. The greatest dilatation is in the central bile ducts which demonstrate wall thickening near the hepatic hilum which could be related to cholangitis or infiltrating disease. This was present on both prior studies. Hepatic parenchymal enhancement is more homogeneous than on the last study possible bolus phase related. Major hepatic vascular structures are patent. There is moderately dense material in gallbladder consistent with vicarious contrast excretion or dense sludge. No stones, wall thickening or pericholecystic fluid are seen. There is a 2.1 cm heterogeneous mass in the lateral aspect of left hepatic segment 2, again most likely representing a metastasis and not seen on 11/19/2022. Focal periligamentous fat is seen in segment 4 B. in segment 5 there is a potentially new 5 mm hypodensity on 2:24 which could be a small metastasis. In segment 8 there is a 4 mm  chronic cyst on 2:14. No other visible focal liver abnormality. Pancreas: Slightly dilated proximal pancreatic duct 4 mm, unchanged. No further abnormality. Spleen: No abnormality. Adrenals/Urinary Tract: There is no adrenal mass, no renal mass enhancement. 1.9 cm Bosniak 1 cyst in the posterior left kidney measures 11 Hounsfield units. There is bilateral nephrolithiasis. Largest stone on the left is in the upper pole measuring 1.1 cm, largest on the right is in the lower pole and measures 8 mm. There is cortical scarring in the right lower pole but no mass enhancement of either kidney. No obstructing stones or hydronephrosis.  Unremarkable bladder. Stomach/Bowel: There are thickened folds in the stomach. Thickened folds in the distal duodenum and jejunum consistent with gastroenteritis. No small bowel dilatation or inflammatory changes seen. The appendix is normal. There are increasingly thickened folds in the transverse colon. Given the short interval timeframe of the appearance of this, inflammatory or infectious etiology such as colitis is more likely than infiltrating disease. There is moderate thickening in the presacral rectum again noted consistent with neoplasm or radiation changes. There is mild-to-moderate fecal stasis in the remaining large bowel. Vascular/Lymphatic: Aortic atherosclerosis. No enlarged abdominal or pelvic lymph nodes. Reproductive: Uterus and bilateral adnexa are unremarkable. Other: Multiple peritoneal/omental implants. Examples include a solid nodule in mid left paracolic gutter measuring 1.7 x 1.4 cm on 2:38, scattered smaller nodules distal to this in the more distal gutter, omental implant in the anterior right lower abdomen measuring 1.7 x 1.3 cm on 2:49, and an omental nodule right of the midline in the mid abdomen measuring 1.5 x 1.7 on 2: 42. These omental implants are not notably changed since 9 days ago but are slightly larger than on 11/19/2022. There is a small volume of  pelvic ascites the left. There is  no free air. 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 No bone metastasis is seen. IMPRESSION: 1. Increasingly thickened folds in the transverse colon, more likely to be infectious or inflammatory than neoplastic. 2. Gastroenteritis. 3. 2.1 cm heterogeneous mass in the lateral aspect of left hepatic segment 2, most likely a metastasis and not seen on 11/19/2022. 4. Potentially new 5 mm hypodensity in segment 5 which could be a small metastasis. 5. Stable biliary stents with intrahepatic biliary dilatation, greater in the left lobe, similar to the last CT but increased since 11/19/2022. 6. Central biliary wall thickening near the hepatic hilum which could be related to cholangitis or infiltrating disease. 7. Multiple peritoneal/omental implants, not notably changed since 9 days ago but slightly larger than on 11/19/2022. 8. Small volume of pelvic ascites. 9. Constipation. 10. Aortic atherosclerosis. 11. Bilateral nephrolithiasis. 12. 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 Aortic Atherosclerosis (ICD10-I70.0). Electronically Signed   By: Almira Bar M.D.   On: 12/24/2022 04:56   DG Chest Port 1 View Result Date: 12/24/2022 CLINICAL DATA:  Colon cancer, nausea, diarrhea, possible sepsis EXAM: PORTABLE CHEST 1 VIEW COMPARISON:  CT chest dated 12/15/2022 FINDINGS: Lungs are clear.  No pleural effusion or pneumothorax. The heart is normal in size. IMPRESSION: No acute cardiopulmonary disease. Electronically Signed   By: Charline Bills M.D.   On: 12/24/2022 01:09   [place] EKG SITE RITE Result Date: 12/16/2022 If Site Rite image not attached, placement could not be confirmed due to current cardiac rhythm.  CT Chest W Contrast Result Date:  12/16/2022 CLINICAL DATA:  Occult malignancy. Low-grade fevers, chills, sweats, nausea and vomiting, abdominal and back pain. Right-sided neck adenopathy. Patient is currently being treated for metastatic colon cancer. EXAM: CT CHEST, ABDOMEN, AND PELVIS WITH CONTRAST TECHNIQUE: Multidetector CT imaging of the chest, abdomen and pelvis was performed following the standard protocol during bolus administration of intravenous contrast. RADIATION DOSE REDUCTION: This exam was performed according to the departmental dose-optimization program which includes automated exposure control, adjustment of the mA and/or kV according to patient size and/or use of iterative reconstruction technique. CONTRAST:  OMNIPAQUE IOHEXOL 300 MG/ML  SOLN COMPARISON:  CT chest 11/21/2022. CT chest abdomen and pelvis 10/15/2022 FINDINGS: CT CHEST FINDINGS Cardiovascular: Normal heart size. No pericardial effusions. Ascending aortic aneurysm measuring 4 cm AP diameter. No change since prior study. No aortic dissection. Great vessel origins are patent. Mediastinum/Nodes: Esophagus is decompressed. Thyroid gland is unremarkable. No significant lymphadenopathy. Lungs/Pleura: Mild linear atelectasis in the lung bases. Bullous emphysematous changes in the apices. No focal pulmonary parenchymal lesions, consolidation, or airspace disease. No pleural effusions. No pneumothorax. Musculoskeletal: No chest wall mass or suspicious bone lesions identified. CT ABDOMEN PELVIS FINDINGS Hepatobiliary: A biliary stent is in place with residual intrahepatic bile duct dilatation. Bile duct dilatation is increased since prior studies. Pneumobilia consistent with stent placement. Focal hypodense lesion demonstrated in segment 3 of the liver measuring 1.6 cm diameter. This is not demonstrated in the prior study and could be metastatic. Heterogeneous parenchymal enhancement to the liver possibly indicating vascular compromise. Increased density in the  gallbladder likely due to vicarious excretion of contrast. Pancreas: Unremarkable. No pancreatic ductal dilatation or surrounding inflammatory changes. Spleen: Normal in size without focal abnormality. Adrenals/Urinary Tract: No adrenal gland nodules. Bilateral intrarenal stones. Largest on the  left measures 1.2 cm diameter. No change since prior studies. No hydronephrosis or hydroureter. Bladder is normal. Stomach/Bowel: Stomach, small bowel, and colon are not abnormally distended. Wall thickening is again demonstrated in the upper rectum possibly correlating to neoplasm or radiation change. No proximal obstruction. Vascular/Lymphatic: Aortic atherosclerosis. No enlarged abdominal or pelvic lymph nodes. Reproductive: Uterus and bilateral adnexa are unremarkable. Other: Heterogeneous peritoneal and omental nodularity with conglomerate omental nodules measuring up to 2.7 x 1.3 cm in diameter. Omental nodularity seems increased since prior study from 10/15/2022. This may represent progressive metastatic disease. No free air or free fluid in the abdomen. 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 No focal bone lesions. IMPRESSION: 1. Ascending aortic aneurysm measuring 4 cm diameter. Recommend annual imaging followup by CTA or MRA. This recommendation follows 2010 ACCF/AHA/AATS/ACR/ASA/SCA/SCAI/SIR/STS/SVM Guidelines for the Diagnosis and Management of Patients with Thoracic Aortic Disease. Circulation. 2010; 121: N829-F621. Aortic aneurysm NOS (ICD10-I71.9) 2. No evidence of metastatic disease in the chest. Emphysematous changes in the lungs. 3. Biliary stent with residual bile duct dilatation, increasing since prior study. Possible developing nodule in the lateral segment left lobe of the liver may indicate progressive metastatic disease. Heterogeneous enhancement pattern of the liver parenchyma could indicate vascular compromise. 4. Increasing omental nodularity may indicate progressive  metastatic disease. 5. Persistent wall thickening in the upper rectum possibly correlates to history of neoplasm or radiation change. 6. Aortic atherosclerosis. 7. 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 8. Bilateral nonobstructing intrarenal stones. Electronically Signed   By: Burman Nieves M.D.   On: 12/16/2022 00:43   CT ABDOMEN PELVIS W CONTRAST Result Date: 12/16/2022 CLINICAL DATA:  Occult malignancy. Low-grade fevers, chills, sweats, nausea and vomiting, abdominal and back pain. Right-sided neck adenopathy. Patient is currently being treated for metastatic colon cancer. EXAM: CT CHEST, ABDOMEN, AND PELVIS WITH CONTRAST TECHNIQUE: Multidetector CT imaging of the chest, abdomen and pelvis was performed following the standard protocol during bolus administration of intravenous contrast. RADIATION DOSE REDUCTION: This exam was performed according to the departmental dose-optimization program which includes automated exposure control, adjustment of the mA and/or kV according to patient size and/or use of iterative reconstruction technique. CONTRAST:  OMNIPAQUE IOHEXOL 300 MG/ML  SOLN COMPARISON:  CT chest 11/21/2022. CT chest abdomen and pelvis 10/15/2022 FINDINGS: CT CHEST FINDINGS Cardiovascular: Normal heart size. No pericardial effusions. Ascending aortic aneurysm measuring 4 cm AP diameter. No change since prior study. No aortic dissection. Great vessel origins are patent. Mediastinum/Nodes: Esophagus is decompressed. Thyroid gland is unremarkable. No significant lymphadenopathy. Lungs/Pleura: Mild linear atelectasis in the lung bases. Bullous emphysematous changes in the apices. No focal pulmonary parenchymal lesions, consolidation, or airspace disease. No pleural effusions. No pneumothorax. Musculoskeletal: No chest wall mass or suspicious bone lesions identified. CT ABDOMEN PELVIS FINDINGS Hepatobiliary: A biliary stent is in place with residual intrahepatic bile duct  dilatation. Bile duct dilatation is increased since prior studies. Pneumobilia consistent with stent placement. Focal hypodense lesion demonstrated in segment 3 of the liver measuring 1.6 cm diameter. This is not demonstrated in the prior study and could be metastatic. Heterogeneous parenchymal enhancement to the liver possibly indicating vascular compromise. Increased density in the gallbladder likely due to vicarious excretion of contrast. Pancreas: Unremarkable. No pancreatic ductal dilatation or surrounding inflammatory changes. Spleen: Normal in size without focal abnormality. Adrenals/Urinary Tract: No adrenal gland nodules. Bilateral intrarenal stones. Largest on the left measures 1.2 cm diameter. No change since prior studies. No hydronephrosis or hydroureter. Bladder is normal. Stomach/Bowel: Stomach, small  bowel, and colon are not abnormally distended. Wall thickening is again demonstrated in the upper rectum possibly correlating to neoplasm or radiation change. No proximal obstruction. Vascular/Lymphatic: Aortic atherosclerosis. No enlarged abdominal or pelvic lymph nodes. Reproductive: Uterus and bilateral adnexa are unremarkable. Other: Heterogeneous peritoneal and omental nodularity with conglomerate omental nodules measuring up to 2.7 x 1.3 cm in diameter. Omental nodularity seems increased since prior study from 10/15/2022. This may represent progressive metastatic disease. No free air or free fluid in the abdomen. 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 No focal bone lesions. IMPRESSION: 1. Ascending aortic aneurysm measuring 4 cm diameter. Recommend annual imaging followup by CTA or MRA. This recommendation follows 2010 ACCF/AHA/AATS/ACR/ASA/SCA/SCAI/SIR/STS/SVM Guidelines for the Diagnosis and Management of Patients with Thoracic Aortic Disease. Circulation. 2010; 121: Z610-R604. Aortic aneurysm NOS (ICD10-I71.9) 2. No evidence of metastatic disease in the chest.  Emphysematous changes in the lungs. 3. Biliary stent with residual bile duct dilatation, increasing since prior study. Possible developing nodule in the lateral segment left lobe of the liver may indicate progressive metastatic disease. Heterogeneous enhancement pattern of the liver parenchyma could indicate vascular compromise. 4. Increasing omental nodularity may indicate progressive metastatic disease. 5. Persistent wall thickening in the upper rectum possibly correlates to history of neoplasm or radiation change. 6. Aortic atherosclerosis. 7. 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 8. Bilateral nonobstructing intrarenal stones. Electronically Signed   By: Burman Nieves M.D.   On: 12/16/2022 00:43   CT Soft Tissue Neck W Contrast Result Date: 12/15/2022 CLINICAL DATA:  Right facial swelling EXAM: CT NECK WITH CONTRAST TECHNIQUE: Multidetector CT imaging of the neck was performed using the standard protocol following the bolus administration of intravenous contrast. RADIATION DOSE REDUCTION: This exam was performed according to the departmental dose-optimization program which includes automated exposure control, adjustment of the mA and/or kV according to patient size and/or use of iterative reconstruction technique. CONTRAST:  OMNIPAQUE IOHEXOL 300 MG/ML  SOLN COMPARISON:  None Available. FINDINGS: Pharynx and larynx: Normal. No mass or swelling. Salivary glands: There is a 2 mm calculus of the distal right submandibular duct. Mild ductal dilatation. There is mild inflammatory change adjacent to the right submandibular gland. There is mild intraglandular ductal dilatation of the left submandibular gland. The parotid glands are normal. Thyroid: Normal Lymph nodes: Reactive subcentimeter right submandibular nodes. Vascular: Negative. Limited intracranial: Negative. Visualized orbits: Negative. Mastoids and visualized paranasal sinuses: Clear. Skeleton: Bulky anterior cervical  osteophytes. Upper chest: Right apical bulla. Other: None IMPRESSION: 1. A 2 mm calculus of the distal right submandibular duct with mild ductal dilatation and mild inflammatory change adjacent to the right submandibular gland. 2. Mild intraglandular ductal dilatation of the left submandibular gland. Electronically Signed   By: Deatra Robinson M.D.   On: 12/15/2022 23:34   DG Chest Port 1 View Result Date: 12/15/2022 CLINICAL DATA:  Cough, chills and generalized body aches. EXAM: PORTABLE CHEST 1 VIEW COMPARISON:  July 24, 2022 FINDINGS: The heart size and mediastinal contours are within normal limits. Both lungs are clear. The visualized skeletal structures are unremarkable. IMPRESSION: No active disease. Electronically Signed   By: Aram Candela M.D.   On: 12/15/2022 20:18   IR PATIENT EVAL TECH 0-60 MINS Result Date: 12/15/2022 Arby Barrette     12/15/2022  4:25 PM  Patient ID: April [name], female   DOB: 12-07-1975, 47 y.o.   MRN: [id number] Pt presented to IR dept today as OP for PICC placement to assist with chemotherapy for met  colon cancer. On presentation pt appeared weak and also c/o low grade fevers/chills/sweats, N/V , abd/back pain and rt sided neck adenopathy. Above was discussed with ordering team/Lisa Thomas,NP with oncology and they advised to send pt to ED for further evaluation. PICC placement canceled for now.     Ms. Janney was admitted yesterday with a high fever, nausea, and abdominal pain.  Blood cultures are positive for Klebsiella pneumoniae.  There is most likely a gastrointestinal source for infection, potentially transudation from tumor involving the gastrointestinal tract, colitis, or biliary infection.  I reviewed the admission CT images.  She has evidence of progressive metastatic colon cancer involving the liver and abdominal cavity.  The plan is to initiate systemic therapy when she has recovered from the current infection.  She will begin treatment with  irinotecan/panitumumab versus a clinical trial at [facility].  I think it is unlikely the recently placed PICC is the source of infection.  Recommendations: Continue antibiotics as recommended by the medical and gastroenterology services Antihypertensives Continue MS Contin, Dilaudid or hydrocodone as needed for breakthrough pain Please call oncology as needed, outpatient follow-up will be scheduled at the Cancer center

## 2022-12-26 ENCOUNTER — Other Ambulatory Visit: Payer: Self-pay | Admitting: Oncology

## 2022-12-26 ENCOUNTER — Encounter: Payer: Self-pay | Admitting: Oncology

## 2022-12-26 ENCOUNTER — Other Ambulatory Visit (HOSPITAL_COMMUNITY): Payer: Self-pay

## 2022-12-26 DIAGNOSIS — K529 Noninfective gastroenteritis and colitis, unspecified: Secondary | ICD-10-CM

## 2022-12-26 DIAGNOSIS — A419 Sepsis, unspecified organism: Secondary | ICD-10-CM | POA: Diagnosis not present

## 2022-12-26 DIAGNOSIS — C189 Malignant neoplasm of colon, unspecified: Secondary | ICD-10-CM

## 2022-12-26 DIAGNOSIS — B961 Klebsiella pneumoniae [K. pneumoniae] as the cause of diseases classified elsewhere: Secondary | ICD-10-CM | POA: Diagnosis not present

## 2022-12-26 DIAGNOSIS — R7881 Bacteremia: Secondary | ICD-10-CM

## 2022-12-26 DIAGNOSIS — C787 Secondary malignant neoplasm of liver and intrahepatic bile duct: Secondary | ICD-10-CM | POA: Diagnosis not present

## 2022-12-26 LAB — CULTURE, BLOOD (SINGLE): Special Requests: ADEQUATE

## 2022-12-26 LAB — CBC WITH DIFFERENTIAL/PLATELET
Abs Immature Granulocytes: 0.01 10*3/uL (ref 0.00–0.07)
Basophils Absolute: 0 10*3/uL (ref 0.0–0.1)
Basophils Relative: 1 %
Eosinophils Absolute: 0.3 10*3/uL (ref 0.0–0.5)
Eosinophils Relative: 5 %
HCT: 36.4 % (ref 36.0–46.0)
Hemoglobin: 12.3 g/dL (ref 12.0–15.0)
Immature Granulocytes: 0 %
Lymphocytes Relative: 46 %
Lymphs Abs: 3.1 10*3/uL (ref 0.7–4.0)
MCH: 30.8 pg (ref 26.0–34.0)
MCHC: 33.8 g/dL (ref 30.0–36.0)
MCV: 91.2 fL (ref 80.0–100.0)
Monocytes Absolute: 0.6 10*3/uL (ref 0.1–1.0)
Monocytes Relative: 9 %
Neutro Abs: 2.6 10*3/uL (ref 1.7–7.7)
Neutrophils Relative %: 39 %
Platelets: 297 10*3/uL (ref 150–400)
RBC: 3.99 MIL/uL (ref 3.87–5.11)
RDW: 13.2 % (ref 11.5–15.5)
WBC: 6.7 10*3/uL (ref 4.0–10.5)
nRBC: 0 % (ref 0.0–0.2)

## 2022-12-26 LAB — COMPREHENSIVE METABOLIC PANEL
ALT: 51 U/L — ABNORMAL HIGH (ref 0–44)
AST: 55 U/L — ABNORMAL HIGH (ref 15–41)
Albumin: 3.5 g/dL (ref 3.5–5.0)
Alkaline Phosphatase: 357 U/L — ABNORMAL HIGH (ref 38–126)
Anion gap: 11 (ref 5–15)
BUN: 7 mg/dL (ref 6–20)
CO2: 29 mmol/L (ref 22–32)
Calcium: 9.2 mg/dL (ref 8.9–10.3)
Chloride: 95 mmol/L — ABNORMAL LOW (ref 98–111)
Creatinine, Ser: 0.45 mg/dL (ref 0.44–1.00)
GFR, Estimated: >60 mL/min (ref >60)
Glucose, Bld: 103 mg/dL — ABNORMAL HIGH (ref 70–99)
Potassium: 3.4 mmol/L — ABNORMAL LOW (ref 3.5–5.1)
Sodium: 135 mmol/L (ref 135–145)
Total Bilirubin: 0.2 mg/dL (ref <1.2)
Total Protein: 7.9 g/dL (ref 6.5–8.1)

## 2022-12-26 LAB — CULTURE, BLOOD (ROUTINE X 2): Special Requests: ADEQUATE

## 2022-12-26 LAB — MAGNESIUM: Magnesium: 2.1 mg/dL (ref 1.7–2.4)

## 2022-12-26 MED ORDER — POTASSIUM CHLORIDE CRYS ER 20 MEQ PO TBCR
40.0000 meq | EXTENDED_RELEASE_TABLET | Freq: Once | ORAL | Status: AC
  Administered 2022-12-26: 40 meq via ORAL
  Filled 2022-12-26: qty 2

## 2022-12-26 MED ORDER — AMOXICILLIN-POT CLAVULANATE 875-125 MG PO TABS
1.0000 | ORAL_TABLET | Freq: Two times a day (BID) | ORAL | Status: DC
  Administered 2022-12-26: 1 via ORAL
  Filled 2022-12-26: qty 1

## 2022-12-26 MED ORDER — OXYCODONE HCL ER 10 MG PO T12A
10.0000 mg | EXTENDED_RELEASE_TABLET | ORAL | 0 refills | Status: DC
  Filled 2022-12-26: qty 30, 5d supply, fill #0

## 2022-12-26 MED ORDER — CHLORTHALIDONE 25 MG PO TABS
25.0000 mg | ORAL_TABLET | Freq: Every day | ORAL | Status: DC
Start: 2022-12-26 — End: 2022-12-26
  Administered 2022-12-26: 25 mg via ORAL
  Filled 2022-12-26: qty 1

## 2022-12-26 MED ORDER — AMOXICILLIN-POT CLAVULANATE 875-125 MG PO TABS: 1.0000 | ORAL_TABLET | Freq: Two times a day (BID) | ORAL | 0 refills | Status: DC

## 2022-12-26 MED ORDER — OXYCODONE HCL 10 MG PO TABS
10.0000 mg | ORAL_TABLET | Freq: Four times a day (QID) | ORAL | 0 refills | Status: DC | PRN
  Filled 2022-12-26: qty 45, 12d supply, fill #0

## 2022-12-26 NOTE — Progress Notes (Signed)
     [facility] Progress Note  CC:  Klebsiella bacteremia   Subjective:  Still feeling better.  Her pain is at her baseline.  No further nausea or vomiting and is eating well.  No diarrhea in a few days, actually having normal stool now.  Objective:  Vital signs in last 24 hours: Temp:  [98.1 F (36.7 C)-98.9 F (37.2 C)] 98.1 F (36.7 C) (12/12 2116) Pulse Rate:  [79-98] 79 (12/12 2116) Resp:  [12-20] 12 (12/13 0800) BP: (151-158)/(115-116) 158/116 (12/12 2116) SpO2:  [96 %-100 %] 100 % (12/12 2116) Last BM Date : 12/24/22 General:  Alert, Well-developed, in NAD Heart:  Regular rate and rhythm; no murmurs Pulm:  CTAB.  No W/R/R. Abdomen:  Soft, non-distended.  BS present.  Minimal TTP. Extremities:  Without edema. Neurologic:  Alert and oriented x 4;  grossly normal neurologically. Psych:  Alert and cooperative. Normal mood and affect.  Intake/Output from previous day: 12/12 0701 - 12/13 0700 In: 800 [P.O.:600; IV Piggyback:200] Out: -   Lab Results: Recent Labs    12/24/22 0049 12/25/22 0426 12/26/22 0436  WBC 10.7* 6.2 6.7  HGB 13.5 11.2* 12.3  HCT 39.7 33.4* 36.4  PLT 318 259 297   BMET Recent Labs    12/24/22 0803 12/25/22 0426 12/26/22 0436  NA 134* 133* 135  K 2.9* 3.6 3.4*  CL 97* 99 95*  CO2 27 26 29   GLUCOSE 98 87 103*  BUN 8 6 7   CREATININE 0.43* 0.40* 0.45  CALCIUM 8.6* 8.6* 9.2   LFT Recent Labs    12/26/22 0436  PROT 7.9  ALBUMIN 3.5  AST 55*  ALT 51*  ALKPHOS 357*  BILITOT 0.2   PT/INR Recent Labs    12/24/22 0050  LABPROT 13.3  INR 1.0   Assessment / Plan: *Klebsiella bacteremia: Patient presented with diarrhea, nausea, vomiting, fever, and CT scan suggesting gastroenteritis/colitis.  Patient on Rocephin and is improving significantly. 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  CT  scan of the biliary system not significantly changed.  -Discussed with Dr. Meridee Score.  Patient is improving with treatment of antibiotics, Rocephin.  Recommend continuing treatment for gastroenteritis/colitis as source of her bacteremia.  Continue abx for a 7-10 day course.  She has an OV with Dr. Meridee Score next week and he is having his nurse working on securing her a spot for ERCP in January, although date not finalized yet.     LOS: 2 days   Princella Pellegrini. Edman Lipsey  12/26/2022, 8:44 AM

## 2022-12-26 NOTE — Progress Notes (Addendum)
Shelby Dubin Scholle   DOB:Aug 17, 1975   JX#:[id number]      ASSESSMENT & PLAN:  1.  Sigmoid colon cancer, stage IV (pT4a,pN2b,M1c)  -Intramucosal adenocarcinoma, diagnosed 2021 -s/p FOLFIRI/bevacizumab held, Udenyca given in 2021.  From 2022 to 2023 FOLFIRI with panitumumab was given.   -Maintenance Xeloda was given in 2022  -Lonsurf was given in 2023. -More recently in 2024 patient has received FOLFOX therapy. -Patient was scheduled to have salvage therapy with irinotecan/panitumumab on 12/29/2022.  However has been admitted with high fever, likely biliary sepsis and sepsis.   -Patient states that she has rescheduled with [facility] on 01/08/2023 for clinical trials. -CT abdomen pelvis done 12/24/2022 showed 2.1 cm heterogeneous mass left hepatic close, likely mets not previously seen on scan 11/19/2022.  Also shows multiple peritoneal/omental implants.  -Patient seen today by GI who are recommending continuing treatment for bacteremia.  Patient will continue 7 to 10-day course of antibiotics.   -Our plan is to begin systemic therapy when she has recovered from this current infection. -Patient to follow-up outpatient oncology with Dr. Truett Perna  2.  Nausea/vomiting -Improved significantly since admission -Continue supportive care  3.  Abdominal pain -Secondary to metastatic colon cancer -Patient states pain now at her baseline -Added oxycodone 10 mg Q4 to patient's regimen -per Dr. Kalman Drape request.  Patient requested that this rx be sent to [facility] pharmacy for her to pick up, done per request.   -Also may resume MiraLAX at home as needed.  4.  Hypertension -Continue antihypertensives -Monitor BP closely  5.  Peripheral neuropathy -Likely due to previous chemotherapy treatments -Continue to monitor  Code Status Full   Discharge planning: Okay from Onc point of view to discharge to home  Subjective:  Patient seen awake alert and oriented x 3.  Appears in better spirits today and  looking forward to home discharge.  Reports nausea has improved.  Denies new acute complaints.  Patient requesting additional pain meds for home.  Objective:  Vitals:   12/26/22 0745 12/26/22 0800  BP:    Pulse:    Resp: 14 12  Temp:    SpO2:       Intake/Output Summary (Last 24 hours) at 12/26/2022 0930 Last data filed at 12/26/2022 0330 Gross per 24 hour  Intake 680 ml  Output --  Net 680 ml     REVIEW OF SYSTEMS:   Constitutional: Denies fevers, chills or abnormal night sweats Eyes: Denies blurriness of vision, double vision or watery eyes Ears, nose, mouth, throat, and face: Denies mucositis or sore throat Respiratory: Denies cough, dyspnea or wheezes Cardiovascular: Denies palpitation, chest discomfort or lower extremity swelling Gastrointestinal:  +nausea, heartburn or change in bowel habits Skin: Denies abnormal skin rashes Lymphatics: Denies new lymphadenopathy or easy bruising Neurological: Denies numbness, tingling or new weaknesses Behavioral/Psych: Mood is stable, no new changes  All other systems were reviewed with the patient and are negative.  PHYSICAL EXAMINATION: ECOG PERFORMANCE STATUS: 1 - Symptomatic but completely ambulatory  Vitals:   12/26/22 0745 12/26/22 0800  BP:    Pulse:    Resp: 14 12  Temp:    SpO2:     Filed Weights   12/24/22 0020  Weight: 141 lb 11.2 oz (64.3 kg)    GENERAL: alert, no distress and comfortable SKIN: skin color, texture, turgor are normal, no rashes or significant lesions EYES: normal, conjunctiva are pink and non-injected, sclera clear OROPHARYNX: no exudate, no erythema and lips, buccal mucosa, and tongue normal  NECK: supple,  thyroid normal size, non-tender, without nodularity LYMPH: no palpable lymphadenopathy in the cervical, axillary or inguinal LUNGS: clear to auscultation and percussion with normal breathing effort HEART: regular rate & rhythm and no murmurs and no lower extremity edema ABDOMEN: abdomen  soft, non-tender and normal bowel sounds MUSCULOSKELETAL: no cyanosis of digits and no clubbing  PSYCH: alert & oriented x 3 with fluent speech NEURO: no focal motor/sensory deficits   All questions were answered. The patient knows to call the clinic with any problems, questions or concerns.   The total time spent in the appointment was 30 minutes so he is going later later encounter with patient including review of chart and various tests results, discussions about plan of care and coordination of care plan  Dawson Bills, NP 12/26/2022 9:30 AM    Labs Reviewed:  Lab Results  Component Value Date   WBC 6.7 12/26/2022   HGB 12.3 12/26/2022   HCT 36.4 12/26/2022   MCV 91.2 12/26/2022   PLT 297 12/26/2022   Recent Labs    03/27/22 1212 03/31/22 0945 11/21/22 0557 11/22/22 0710 12/16/22 0505 12/17/22 0500 12/24/22 0049 12/24/22 0803 12/25/22 0426 12/26/22 0436  NA  --    < > 138   < > 132*   < > 132* 134* 133* 135  K 3.8   < > 3.5   < > 2.9*   < > 2.7* 2.9* 3.6 3.4*  CL  --    < > 101   < > 97*   < > 93* 97* 99 95*  CO2  --    < > 24   < > 29   < > 26 27 26 29   GLUCOSE  --    < > 139*   < > 97   < > 109* 98 87 103*  BUN  --    < > 9   < > 8   < > 8 8 6 7   CREATININE  --    < > 0.58   < > 0.52   < > 0.49 0.43* 0.40* 0.45  CALCIUM  --    < > 10.3   < > 8.5*   < > 9.3 8.6* 8.6* 9.2  GFRNONAA  --    < > >60   < > >60   < > >60 >60 >60 >60  PROT 6.8   < > 8.9*   < > 7.1   < > 8.4*  --  7.1 7.9  ALBUMIN 3.2*   < > 4.4  4.5   < > 3.2*   < > 3.8  --  3.1* 3.5  AST 78*   < > 35   < > 37   < > 47*  --  47* 55*  ALT 172*   < > 24   < > 28   < > 55*  --  43 51*  ALKPHOS 506*   < > 172*   < > 204*   < > 354*  --  293* 357*  BILITOT 2.3*   < > 0.6   < > 0.5   < > 0.5  --  0.5 0.2  BILIDIR 1.1*  --  <0.1  --  0.1  --   --   --   --   --   IBILI 1.2*  --  NOT CALCULATED  --  0.4  --   --   --   --   --    < > =  values in this interval not displayed.    Studies Reviewed:  CT  ABDOMEN PELVIS W CONTRAST Result Date: 12/24/2022 CLINICAL DATA:  47 year old with metastatic colon cancer presents with abdominal pain for 3 days. History of pancreatic stent. EXAM: CT ABDOMEN AND PELVIS WITH CONTRAST TECHNIQUE: Multidetector CT imaging of the abdomen and pelvis was performed using the standard protocol following bolus administration of intravenous contrast. RADIATION DOSE REDUCTION: This exam was performed according to the departmental dose-optimization program which includes automated exposure control, adjustment of the mA and/or kV according to patient size and/or use of iterative reconstruction technique. CONTRAST:  OMNIPAQUE IOHEXOL 300 MG/ML  SOLN COMPARISON:  CTs with IV contrast 12/15/2022 and 11/19/2022. FINDINGS: Lower chest: No acute abnormality. Hepatobiliary: Stenting again noted of the left and right hepatic ducts extending down the common bile duct into the duodenum terminating in the horizontal duodenum to the left. Intrahepatic biliary dilatation, greater in the left lobe is similar to the last CT but increased since 11/19/2022. The greatest dilatation is in the central bile ducts which demonstrate wall thickening near the hepatic hilum which could be related to cholangitis or infiltrating disease. This was present on both prior studies. Hepatic parenchymal enhancement is more homogeneous than on the last study possible bolus phase related. Major hepatic vascular structures are patent. There is moderately dense material in gallbladder consistent with vicarious contrast excretion or dense sludge. No stones, wall thickening or pericholecystic fluid are seen. There is a 2.1 cm heterogeneous mass in the lateral aspect of left hepatic segment 2, again most likely representing a metastasis and not seen on 11/19/2022. Focal periligamentous fat is seen in segment 4 B. in segment 5 there is a potentially new 5 mm hypodensity on 2:24 which could be a small metastasis. In segment 8  there is a 4 mm chronic cyst on 2:14. No other visible focal liver abnormality. Pancreas: Slightly dilated proximal pancreatic duct 4 mm, unchanged. No further abnormality. Spleen: No abnormality. Adrenals/Urinary Tract: There is no adrenal mass, no renal mass enhancement. 1.9 cm Bosniak 1 cyst in the posterior left kidney measures 11 Hounsfield units. There is bilateral nephrolithiasis. Largest stone on the left is in the upper pole measuring 1.1 cm, largest on the right is in the lower pole and measures 8 mm. There is cortical scarring in the right lower pole but no mass enhancement of either kidney. No obstructing stones or hydronephrosis.  Unremarkable bladder. Stomach/Bowel: There are thickened folds in the stomach. Thickened folds in the distal duodenum and jejunum consistent with gastroenteritis. No small bowel dilatation or inflammatory changes seen. The appendix is normal. There are increasingly thickened folds in the transverse colon. Given the short interval timeframe of the appearance of this, inflammatory or infectious etiology such as colitis is more likely than infiltrating disease. There is moderate thickening in the presacral rectum again noted consistent with neoplasm or radiation changes. There is mild-to-moderate fecal stasis in the remaining large bowel. Vascular/Lymphatic: Aortic atherosclerosis. No enlarged abdominal or pelvic lymph nodes. Reproductive: Uterus and bilateral adnexa are unremarkable. Other: Multiple peritoneal/omental implants. Examples include a solid nodule in mid left paracolic gutter measuring 1.7 x 1.4 cm on 2:38, scattered smaller nodules distal to this in the more distal gutter, omental implant in the anterior right lower abdomen measuring 1.7 x 1.3 cm on 2:49, and an omental nodule right of the midline in the mid abdomen measuring 1.5 x 1.7 on 2: 42. These omental implants are not notably changed since 9 days  ago but are slightly larger than on 11/19/2022. There is a  small volume of pelvic ascites the left. There is no free air. 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 No bone metastasis is seen. IMPRESSION: 1. Increasingly thickened folds in the transverse colon, more likely to be infectious or inflammatory than neoplastic. 2. Gastroenteritis. 3. 2.1 cm heterogeneous mass in the lateral aspect of left hepatic segment 2, most likely a metastasis and not seen on 11/19/2022. 4. Potentially new 5 mm hypodensity in segment 5 which could be a small metastasis. 5. Stable biliary stents with intrahepatic biliary dilatation, greater in the left lobe, similar to the last CT but increased since 11/19/2022. 6. Central biliary wall thickening near the hepatic hilum which could be related to cholangitis or infiltrating disease. 7. Multiple peritoneal/omental implants, not notably changed since 9 days ago but slightly larger than on 11/19/2022. 8. Small volume of pelvic ascites. 9. Constipation. 10. Aortic atherosclerosis. 11. Bilateral nephrolithiasis. 12. 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 Aortic Atherosclerosis (ICD10-I70.0). Electronically Signed   By: Almira Bar M.D.   On: 12/24/2022 04:56   DG Chest Port 1 View Result Date: 12/24/2022 CLINICAL DATA:  Colon cancer, nausea, diarrhea, possible sepsis EXAM: PORTABLE CHEST 1 VIEW COMPARISON:  CT chest dated 12/15/2022 FINDINGS: Lungs are clear.  No pleural effusion or pneumothorax. The heart is normal in size. IMPRESSION: No acute cardiopulmonary disease. Electronically Signed   By: Charline Bills M.D.   On: 12/24/2022 01:09   [place] EKG SITE RITE Result Date: 12/16/2022 If Site Rite image not attached, placement could not be confirmed due to current cardiac rhythm.  CT Chest W Contrast Result  Date: 12/16/2022 CLINICAL DATA:  Occult malignancy. Low-grade fevers, chills, sweats, nausea and vomiting, abdominal and back pain. Right-sided neck adenopathy. Patient is currently being treated for metastatic colon cancer. EXAM: CT CHEST, ABDOMEN, AND PELVIS WITH CONTRAST TECHNIQUE: Multidetector CT imaging of the chest, abdomen and pelvis was performed following the standard protocol during bolus administration of intravenous contrast. RADIATION DOSE REDUCTION: This exam was performed according to the departmental dose-optimization program which includes automated exposure control, adjustment of the mA and/or kV according to patient size and/or use of iterative reconstruction technique. CONTRAST:  OMNIPAQUE IOHEXOL 300 MG/ML  SOLN COMPARISON:  CT chest 11/21/2022. CT chest abdomen and pelvis 10/15/2022 FINDINGS: CT CHEST FINDINGS Cardiovascular: Normal heart size. No pericardial effusions. Ascending aortic aneurysm measuring 4 cm AP diameter. No change since prior study. No aortic dissection. Great vessel origins are patent. Mediastinum/Nodes: Esophagus is decompressed. Thyroid gland is unremarkable. No significant lymphadenopathy. Lungs/Pleura: Mild linear atelectasis in the lung bases. Bullous emphysematous changes in the apices. No focal pulmonary parenchymal lesions, consolidation, or airspace disease. No pleural effusions. No pneumothorax. Musculoskeletal: No chest wall mass or suspicious bone lesions identified. CT ABDOMEN PELVIS FINDINGS Hepatobiliary: A biliary stent is in place with residual intrahepatic bile duct dilatation. Bile duct dilatation is increased since prior studies. Pneumobilia consistent with stent placement. Focal hypodense lesion demonstrated in segment 3 of the liver measuring 1.6 cm diameter. This is not demonstrated in the prior study and could be metastatic. Heterogeneous parenchymal enhancement to the liver possibly indicating vascular compromise. Increased density in the  gallbladder likely due to vicarious excretion of contrast. Pancreas: Unremarkable. No pancreatic ductal dilatation or surrounding  inflammatory changes. Spleen: Normal in size without focal abnormality. Adrenals/Urinary Tract: No adrenal gland nodules. Bilateral intrarenal stones. Largest on the left measures 1.2 cm diameter. No change since prior studies. No hydronephrosis or hydroureter. Bladder is normal. Stomach/Bowel: Stomach, small bowel, and colon are not abnormally distended. Wall thickening is again demonstrated in the upper rectum possibly correlating to neoplasm or radiation change. No proximal obstruction. Vascular/Lymphatic: Aortic atherosclerosis. No enlarged abdominal or pelvic lymph nodes. Reproductive: Uterus and bilateral adnexa are unremarkable. Other: Heterogeneous peritoneal and omental nodularity with conglomerate omental nodules measuring up to 2.7 x 1.3 cm in diameter. Omental nodularity seems increased since prior study from 10/15/2022. This may represent progressive metastatic disease. No free air or free fluid in the abdomen. 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 No focal bone lesions. IMPRESSION: 1. Ascending aortic aneurysm measuring 4 cm diameter. Recommend annual imaging followup by CTA or MRA. This recommendation follows 2010 ACCF/AHA/AATS/ACR/ASA/SCA/SCAI/SIR/STS/SVM Guidelines for the Diagnosis and Management of Patients with Thoracic Aortic Disease. Circulation. 2010; 121: B284-X324. Aortic aneurysm NOS (ICD10-I71.9) 2. No evidence of metastatic disease in the chest. Emphysematous changes in the lungs. 3. Biliary stent with residual bile duct dilatation, increasing since prior study. Possible developing nodule in the lateral segment left lobe of the liver may indicate progressive metastatic disease. Heterogeneous enhancement pattern of the liver parenchyma could indicate vascular compromise. 4. Increasing omental nodularity may indicate progressive  metastatic disease. 5. Persistent wall thickening in the upper rectum possibly correlates to history of neoplasm or radiation change. 6. Aortic atherosclerosis. 7. 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 8. Bilateral nonobstructing intrarenal stones. Electronically Signed   By: Burman Nieves M.D.   On: 12/16/2022 00:43   CT ABDOMEN PELVIS W CONTRAST Result Date: 12/16/2022 CLINICAL DATA:  Occult malignancy. Low-grade fevers, chills, sweats, nausea and vomiting, abdominal and back pain. Right-sided neck adenopathy. Patient is currently being treated for metastatic colon cancer. EXAM: CT CHEST, ABDOMEN, AND PELVIS WITH CONTRAST TECHNIQUE: Multidetector CT imaging of the chest, abdomen and pelvis was performed following the standard protocol during bolus administration of intravenous contrast. RADIATION DOSE REDUCTION: This exam was performed according to the departmental dose-optimization program which includes automated exposure control, adjustment of the mA and/or kV according to patient size and/or use of iterative reconstruction technique. CONTRAST:  OMNIPAQUE IOHEXOL 300 MG/ML  SOLN COMPARISON:  CT chest 11/21/2022. CT chest abdomen and pelvis 10/15/2022 FINDINGS: CT CHEST FINDINGS Cardiovascular: Normal heart size. No pericardial effusions. Ascending aortic aneurysm measuring 4 cm AP diameter. No change since prior study. No aortic dissection. Great vessel origins are patent. Mediastinum/Nodes: Esophagus is decompressed. Thyroid gland is unremarkable. No significant lymphadenopathy. Lungs/Pleura: Mild linear atelectasis in the lung bases. Bullous emphysematous changes in the apices. No focal pulmonary parenchymal lesions, consolidation, or airspace disease. No pleural effusions. No pneumothorax. Musculoskeletal: No chest wall mass or suspicious bone lesions identified. CT ABDOMEN PELVIS FINDINGS Hepatobiliary: A biliary stent is in place with residual intrahepatic bile duct  dilatation. Bile duct dilatation is increased since prior studies. Pneumobilia consistent with stent placement. Focal hypodense lesion demonstrated in segment 3 of the liver measuring 1.6 cm diameter. This is not demonstrated in the prior study and could be metastatic. Heterogeneous parenchymal enhancement to the liver possibly indicating vascular compromise. Increased density in the gallbladder likely due to vicarious excretion of contrast. Pancreas: Unremarkable. No pancreatic ductal dilatation or surrounding inflammatory changes. Spleen: Normal in size without focal abnormality. Adrenals/Urinary Tract: No adrenal gland nodules. Bilateral intrarenal stones. Largest on  the left measures 1.2 cm diameter. No change since prior studies. No hydronephrosis or hydroureter. Bladder is normal. Stomach/Bowel: Stomach, small bowel, and colon are not abnormally distended. Wall thickening is again demonstrated in the upper rectum possibly correlating to neoplasm or radiation change. No proximal obstruction. Vascular/Lymphatic: Aortic atherosclerosis. No enlarged abdominal or pelvic lymph nodes. Reproductive: Uterus and bilateral adnexa are unremarkable. Other: Heterogeneous peritoneal and omental nodularity with conglomerate omental nodules measuring up to 2.7 x 1.3 cm in diameter. Omental nodularity seems increased since prior study from 10/15/2022. This may represent progressive metastatic disease. No free air or free fluid in the abdomen. 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 No focal bone lesions. IMPRESSION: 1. Ascending aortic aneurysm measuring 4 cm diameter. Recommend annual imaging followup by CTA or MRA. This recommendation follows 2010 ACCF/AHA/AATS/ACR/ASA/SCA/SCAI/SIR/STS/SVM Guidelines for the Diagnosis and Management of Patients with Thoracic Aortic Disease. Circulation. 2010; 121: Z610-R604. Aortic aneurysm NOS (ICD10-I71.9) 2. No evidence of metastatic disease in the chest.  Emphysematous changes in the lungs. 3. Biliary stent with residual bile duct dilatation, increasing since prior study. Possible developing nodule in the lateral segment left lobe of the liver may indicate progressive metastatic disease. Heterogeneous enhancement pattern of the liver parenchyma could indicate vascular compromise. 4. Increasing omental nodularity may indicate progressive metastatic disease. 5. Persistent wall thickening in the upper rectum possibly correlates to history of neoplasm or radiation change. 6. Aortic atherosclerosis. 7. 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 8. Bilateral nonobstructing intrarenal stones. Electronically Signed   By: Burman Nieves M.D.   On: 12/16/2022 00:43   CT Soft Tissue Neck W Contrast Result Date: 12/15/2022 CLINICAL DATA:  Right facial swelling EXAM: CT NECK WITH CONTRAST TECHNIQUE: Multidetector CT imaging of the neck was performed using the standard protocol following the bolus administration of intravenous contrast. RADIATION DOSE REDUCTION: This exam was performed according to the departmental dose-optimization program which includes automated exposure control, adjustment of the mA and/or kV according to patient size and/or use of iterative reconstruction technique. CONTRAST:  OMNIPAQUE IOHEXOL 300 MG/ML  SOLN COMPARISON:  None Available. FINDINGS: Pharynx and larynx: Normal. No mass or swelling. Salivary glands: There is a 2 mm calculus of the distal right submandibular duct. Mild ductal dilatation. There is mild inflammatory change adjacent to the right submandibular gland. There is mild intraglandular ductal dilatation of the left submandibular gland. The parotid glands are normal. Thyroid: Normal Lymph nodes: Reactive subcentimeter right submandibular nodes. Vascular: Negative. Limited intracranial: Negative. Visualized orbits: Negative. Mastoids and visualized paranasal sinuses: Clear. Skeleton: Bulky anterior cervical  osteophytes. Upper chest: Right apical bulla. Other: None IMPRESSION: 1. A 2 mm calculus of the distal right submandibular duct with mild ductal dilatation and mild inflammatory change adjacent to the right submandibular gland. 2. Mild intraglandular ductal dilatation of the left submandibular gland. Electronically Signed   By: Deatra Robinson M.D.   On: 12/15/2022 23:34   DG Chest Port 1 View Result Date: 12/15/2022 CLINICAL DATA:  Cough, chills and generalized body aches. EXAM: PORTABLE CHEST 1 VIEW COMPARISON:  July 24, 2022 FINDINGS: The heart size and mediastinal contours are within normal limits. Both lungs are clear. The visualized skeletal structures are unremarkable. IMPRESSION: No active disease. Electronically Signed   By: Aram Candela M.D.   On: 12/15/2022 20:18   IR PATIENT EVAL TECH 0-60 MINS Result Date: 12/15/2022 Arby Barrette     12/15/2022  4:25 PM  Patient ID: [name], female   DOB: [birth year], 36 y.o.  MRN: [id number] Pt presented to IR dept today as OP for PICC placement to assist with chemotherapy for met colon cancer. On presentation pt appeared weak and also c/o low grade fevers/chills/sweats, N/V , abd/back pain and rt sided neck adenopathy. Above was discussed with ordering team/Lisa Thomas,NP with oncology and they advised to send pt to ED for further evaluation. PICC placement canceled for now.     Ms. Brannum was interviewed and examined.  No fever.  She continues to have abdominal pain.  She requests a trial of oxycodone since hydrocodone is not relieving the pain.  No diarrhea.  She would like to resume MiraLAX.  Outpatient follow-up will be scheduled at the Cancer center to begin treatment with irinotecan/panitumumab.

## 2022-12-26 NOTE — Discharge Summary (Signed)
Physician Discharge Summary  Esbeidy Pigg ZHY:[id number] DOB: Oct 22, 1975 DOA: 12/24/2022  PCP: Tollie Eth, NP  Admit date: 12/24/2022 Discharge date: 12/26/2022  Admitted From: Home Disposition: Home  Recommendations for Outpatient Follow-up:  Follow up with PCP in 1 week with repeat CBC/CMP Outpatient follow-up with oncology and GI Follow up in ED if symptoms worsen or new appear   Home Health: No Equipment/Devices: None  Discharge Condition: Stable CODE STATUS: Full Diet recommendation: Heart healthy  Brief/Interim Summary: 47 y.o. female with medical history significant of hypertension, nephrolithiasis, emphysema stage IV colon cancer and chronic cancer-related pain, malignant biliary stricture status post stenting, ascending aortic aneurysm and recent admission and discharge from 12/16/2022 to 12/18/2022 for acute submandibular sialadenitis initially treated with IV antibiotics and subsequently discharged home on oral antibiotics presented with worsening abdominal pain, nausea, vomiting, diarrhea and fever.  On presentation, she was febrile 102.2, tachypneic, tachycardic.  WBC of 10.7; negative COVID-19/influenza/RSV PCR.  Chest x-ray was negative for acute cardiopulmonary disease.  CT of abdomen/pelvis with contrast showed increasingly thickened folds in the transverse colon, infectious or inflammatory along with gastroenteritis along with metastatic disease.  She was started on IV fluids and antibiotics.  She was found to have Klebsiella pneumoniae bacteremia.  GI and oncology were consulted.  Her condition has gradually improved.  GI had cleared the patient for discharge and oncology recommended outpatient follow-up.  She will be discharged home on oral Augmentin today.  Discharge Diagnoses:   Sepsis: Present on admission Klebsiella pneumoniae bacteremia -Presented with worsening abdominal pain, nausea, vomiting, diarrhea and fever and was found to be febrile, tachypneic,  tachycardic with Klebsiella pneumoniae bacteremia.  Follow susceptibilities.  Initially started on broad-spectrum antibiotics but currently on IV Rocephin.  Bacteremia is possible from diarrheal illness.  Diarrhea has subsided.  Imaging as above. -No temperature spikes over the last 24 hours.  Sepsis has resolved. -Will switch to oral Augmentin and discharged home today on the same to complete total 10-day course of therapy.   Hypokalemia -Replace.  Continue replacement as an outpatient.  Outpatient follow-up with ENT.   Hyponatremia -Improved   Stage IV colon cancer Chronic cancer-related pain Abnormal LFTs, probably from liver mets disease with history of malignant biliary stricture status post stenting -Imaging shows stable biliary stents with intrahepatic biliary dilatation along with central biliary wall thickening at the hepatic hilum which could be related to cholangitis or infiltrative disease -Continue current pain management.  Oncology follow-up appreciated.  Outpatient follow-up with oncology/Dr. Truett Perna -LFTs are only slightly elevated.  Patient was supposed to have ERCP as an outpatient.  GI following: Recommending to keep outpatient ERCP appointment.  GI has cleared the patient for discharge.   COPD/emphysema -Currently stable.  On room air   Ascending aortic aneurysm -noted incidentally on CT during last admission.  Outpatient follow-up   Hypertension -Blood pressure extremely elevated.  Continue amlodipine and metoprolol.  Resume chlorthalidone.  Outpatient follow-up with PCP.  Discharge Instructions  Discharge Instructions     Diet - low sodium heart healthy   Complete by: As directed    Increase activity slowly   Complete by: As directed       Allergies as of 12/26/2022       Reactions   Lisinopril Swelling, Other (See Comments)   Lip swelling and Angioedema   Irinotecan Other (See Comments)   Stomach cramping Patient given 0.5 mL atropine IV and was  able to continue infusion.  See Progress note on 10/03/20.  Leucovorin Calcium Itching   Patient reported itchy palms towards end of transfusion, see progress note 08/04/22.   Oxaliplatin Itching   Patient reported itchy palms towards end of transfusion, see progress note on 08/04/22.        Medication List     STOP taking these medications    acetaminophen 500 MG tablet Commonly known as: TYLENOL   capecitabine 500 MG tablet Commonly known as: XELODA       TAKE these medications    amLODipine 10 MG tablet Commonly known as: NORVASC Take 1 tablet (10 mg total) by mouth daily.   amoxicillin-clavulanate 875-125 MG tablet Commonly known as: AUGMENTIN Take 1 tablet by mouth every 12 (twelve) hours for 7 days. What changed: when to take this   chlorthalidone 25 MG tablet Commonly known as: HYGROTON Take 1 tablet (25 mg total) by mouth daily.   dicyclomine 20 MG tablet Commonly known as: BENTYL Take 1 tablet (20 mg total) by mouth 3 (three) times daily as needed for spasms (rectal/AB pain). What changed: when to take this   Dulera 200-5 MCG/ACT Aero Generic drug: mometasone-formoterol Inhale 2 puffs into the lungs 2 (two) times daily.   HYDROcodone-acetaminophen 10-325 MG tablet Commonly known as: Norco Take 1 tablet by mouth every 6 (six) hours as needed. What changed:  how much to take when to take this reasons to take this   Incruse Ellipta 62.5 MCG/ACT Aepb Generic drug: umeclidinium bromide Inhale 1 puff into the lungs daily.   metoprolol tartrate 50 MG tablet Commonly known as: LOPRESSOR Take 1 tablet (50 mg total) by mouth 2 (two) times daily.   morphine 15 MG 12 hr tablet Commonly known as: MS CONTIN Take 1 tablet (15 mg total) by mouth every 12 (twelve) hours. What changed:  when to take this reasons to take this   polyethylene glycol powder 17 GM/SCOOP powder Commonly known as: MiraLax Take 17 g by mouth 2 (two) times daily.   potassium  chloride 10 MEQ CR capsule Commonly known as: MICRO-K Take 2 capsules (20 mEq total) by mouth 2 (two) times daily.        Follow-up Information     Early, Sung Amabile, NP. Schedule an appointment as soon as possible for a visit in 1 week(s).   Specialty: Nurse Practitioner Contact information: [address] [place] [address] [phone]                Allergies  Allergen Reactions   Lisinopril Swelling and Other (See Comments)    Lip swelling and Angioedema   Irinotecan Other (See Comments)    Stomach cramping Patient given 0.5 mL atropine IV and was able to continue infusion.  See Progress note on 10/03/20.    Leucovorin Calcium Itching    Patient reported itchy palms towards end of transfusion, see progress note 08/04/22.    Oxaliplatin Itching    Patient reported itchy palms towards end of transfusion, see progress note on 08/04/22.    Consultations: GI/oncology   Procedures/Studies: CT ABDOMEN PELVIS W CONTRAST Result Date: 12/24/2022 CLINICAL DATA:  47 year old with metastatic colon cancer presents with abdominal pain for 3 days. History of pancreatic stent. EXAM: CT ABDOMEN AND PELVIS WITH CONTRAST TECHNIQUE: Multidetector CT imaging of the abdomen and pelvis was performed using the standard protocol following bolus administration of intravenous contrast. RADIATION DOSE REDUCTION: This exam was performed according to the departmental dose-optimization program which includes automated exposure control, adjustment of the mA and/or kV according to  patient size and/or use of iterative reconstruction technique. CONTRAST:  OMNIPAQUE IOHEXOL 300 MG/ML  SOLN COMPARISON:  CTs with IV contrast 12/15/2022 and 11/19/2022. FINDINGS: Lower chest: No acute abnormality. Hepatobiliary: Stenting again noted of the left and right hepatic ducts extending down the common bile duct into the duodenum terminating in the horizontal duodenum to the left. Intrahepatic biliary  dilatation, greater in the left lobe is similar to the last CT but increased since 11/19/2022. The greatest dilatation is in the central bile ducts which demonstrate wall thickening near the hepatic hilum which could be related to cholangitis or infiltrating disease. This was present on both prior studies. Hepatic parenchymal enhancement is more homogeneous than on the last study possible bolus phase related. Major hepatic vascular structures are patent. There is moderately dense material in gallbladder consistent with vicarious contrast excretion or dense sludge. No stones, wall thickening or pericholecystic fluid are seen. There is a 2.1 cm heterogeneous mass in the lateral aspect of left hepatic segment 2, again most likely representing a metastasis and not seen on 11/19/2022. Focal periligamentous fat is seen in segment 4 B. in segment 5 there is a potentially new 5 mm hypodensity on 2:24 which could be a small metastasis. In segment 8 there is a 4 mm chronic cyst on 2:14. No other visible focal liver abnormality. Pancreas: Slightly dilated proximal pancreatic duct 4 mm, unchanged. No further abnormality. Spleen: No abnormality. Adrenals/Urinary Tract: There is no adrenal mass, no renal mass enhancement. 1.9 cm Bosniak 1 cyst in the posterior left kidney measures 11 Hounsfield units. There is bilateral nephrolithiasis. Largest stone on the left is in the upper pole measuring 1.1 cm, largest on the right is in the lower pole and measures 8 mm. There is cortical scarring in the right lower pole but no mass enhancement of either kidney. No obstructing stones or hydronephrosis.  Unremarkable bladder. Stomach/Bowel: There are thickened folds in the stomach. Thickened folds in the distal duodenum and jejunum consistent with gastroenteritis. No small bowel dilatation or inflammatory changes seen. The appendix is normal. There are increasingly thickened folds in the transverse colon. Given the short interval timeframe  of the appearance of this, inflammatory or infectious etiology such as colitis is more likely than infiltrating disease. There is moderate thickening in the presacral rectum again noted consistent with neoplasm or radiation changes. There is mild-to-moderate fecal stasis in the remaining large bowel. Vascular/Lymphatic: Aortic atherosclerosis. No enlarged abdominal or pelvic lymph nodes. Reproductive: Uterus and bilateral adnexa are unremarkable. Other: Multiple peritoneal/omental implants. Examples include a solid nodule in mid left paracolic gutter measuring 1.7 x 1.4 cm on 2:38, scattered smaller nodules distal to this in the more distal gutter, omental implant in the anterior right lower abdomen measuring 1.7 x 1.3 cm on 2:49, and an omental nodule right of the midline in the mid abdomen measuring 1.5 x 1.7 on 2: 42. These omental implants are not notably changed since 9 days ago but are slightly larger than on 11/19/2022. There is a small volume of pelvic ascites the left. There is no free air. 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 No bone metastasis is seen. IMPRESSION: 1. Increasingly thickened folds in the transverse colon, more likely to be infectious or inflammatory than neoplastic. 2. Gastroenteritis. 3. 2.1 cm heterogeneous mass in the lateral aspect of left hepatic segment 2, most likely a metastasis  and not seen on 11/19/2022. 4. Potentially new 5 mm hypodensity in segment 5 which could be a small metastasis. 5. Stable biliary stents with intrahepatic biliary dilatation, greater in the left lobe, similar to the last CT but increased since 11/19/2022. 6. Central biliary wall thickening near the hepatic hilum which could be related to cholangitis or infiltrating disease. 7. Multiple peritoneal/omental implants, not notably changed since 9 days ago but  slightly larger than on 11/19/2022. 8. Small volume of pelvic ascites. 9. Constipation. 10. Aortic atherosclerosis. 11. Bilateral nephrolithiasis. 12. 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 Aortic Atherosclerosis (ICD10-I70.0). Electronically Signed   By: Almira Bar M.D.   On: 12/24/2022 04:56   DG Chest Port 1 View Result Date: 12/24/2022 CLINICAL DATA:  Colon cancer, nausea, diarrhea, possible sepsis EXAM: PORTABLE CHEST 1 VIEW COMPARISON:  CT chest dated 12/15/2022 FINDINGS: Lungs are clear.  No pleural effusion or pneumothorax. The heart is normal in size. IMPRESSION: No acute cardiopulmonary disease. Electronically Signed   By: Charline Bills M.D.   On: 12/24/2022 01:09   [place] EKG SITE RITE Result Date: 12/16/2022 If Site Rite image not attached, placement could not be confirmed due to current cardiac rhythm.  CT Chest W Contrast Result Date: 12/16/2022 CLINICAL DATA:  Occult malignancy. Low-grade fevers, chills, sweats, nausea and vomiting, abdominal and back pain. Right-sided neck adenopathy. Patient is currently being treated for metastatic colon cancer. EXAM: CT CHEST, ABDOMEN, AND PELVIS WITH CONTRAST TECHNIQUE: Multidetector CT imaging of the chest, abdomen and pelvis was performed following the standard protocol during bolus administration of intravenous contrast. RADIATION DOSE REDUCTION: This exam was performed according to the departmental dose-optimization program which includes automated exposure control, adjustment of the mA and/or kV according to patient size and/or use of iterative reconstruction technique. CONTRAST:  OMNIPAQUE IOHEXOL 300 MG/ML  SOLN COMPARISON:  CT chest 11/21/2022. CT chest abdomen and pelvis 10/15/2022 FINDINGS: CT CHEST FINDINGS Cardiovascular: Normal heart size. No pericardial effusions. Ascending aortic aneurysm measuring 4 cm AP diameter. No change since prior study. No  aortic dissection. Great vessel origins are patent. Mediastinum/Nodes: Esophagus is decompressed. Thyroid gland is unremarkable. No significant lymphadenopathy. Lungs/Pleura: Mild linear atelectasis in the lung bases. Bullous emphysematous changes in the apices. No focal pulmonary parenchymal lesions, consolidation, or airspace disease. No pleural effusions. No pneumothorax. Musculoskeletal: No chest wall mass or suspicious bone lesions identified. CT ABDOMEN PELVIS FINDINGS Hepatobiliary: A biliary stent is in place with residual intrahepatic bile duct dilatation. Bile duct dilatation is increased since prior studies. Pneumobilia consistent with stent placement. Focal hypodense lesion demonstrated in segment 3 of the liver measuring 1.6 cm diameter. This is not demonstrated in the prior study and could be metastatic. Heterogeneous parenchymal enhancement to the liver possibly indicating vascular compromise. Increased density in the gallbladder likely due to vicarious excretion of contrast. Pancreas: Unremarkable. No pancreatic ductal dilatation or surrounding inflammatory changes. Spleen: Normal in size without focal abnormality. Adrenals/Urinary Tract: No adrenal gland nodules. Bilateral intrarenal stones. Largest on the left measures 1.2 cm diameter. No change since prior studies. No hydronephrosis or hydroureter. Bladder is normal. Stomach/Bowel: Stomach, small bowel, and colon are not abnormally distended. Wall thickening is again demonstrated in the upper rectum possibly correlating to neoplasm or radiation change. No proximal obstruction. Vascular/Lymphatic: Aortic atherosclerosis. No enlarged abdominal or pelvic lymph nodes. Reproductive: Uterus and bilateral adnexa are unremarkable. Other: Heterogeneous peritoneal and omental nodularity with conglomerate omental nodules  measuring up to 2.7 x 1.3 cm in diameter. Omental nodularity seems increased since prior study from 10/15/2022. This may represent  progressive metastatic disease. No free air or free fluid in the abdomen. 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 No focal bone lesions. IMPRESSION: 1. Ascending aortic aneurysm measuring 4 cm diameter. Recommend annual imaging followup by CTA or MRA. This recommendation follows 2010 ACCF/AHA/AATS/ACR/ASA/SCA/SCAI/SIR/STS/SVM Guidelines for the Diagnosis and Management of Patients with Thoracic Aortic Disease. Circulation. 2010; 121: W119-J478. Aortic aneurysm NOS (ICD10-I71.9) 2. No evidence of metastatic disease in the chest. Emphysematous changes in the lungs. 3. Biliary stent with residual bile duct dilatation, increasing since prior study. Possible developing nodule in the lateral segment left lobe of the liver may indicate progressive metastatic disease. Heterogeneous enhancement pattern of the liver parenchyma could indicate vascular compromise. 4. Increasing omental nodularity may indicate progressive metastatic disease. 5. Persistent wall thickening in the upper rectum possibly correlates to history of neoplasm or radiation change. 6. Aortic atherosclerosis. 7. 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 8. Bilateral nonobstructing intrarenal stones. Electronically Signed   By: Burman Nieves M.D.   On: 12/16/2022 00:43   CT ABDOMEN PELVIS W CONTRAST Result Date: 12/16/2022 CLINICAL DATA:  Occult malignancy. Low-grade fevers, chills, sweats, nausea and vomiting, abdominal and back pain. Right-sided neck adenopathy. Patient is currently being treated for metastatic colon cancer. EXAM: CT CHEST, ABDOMEN, AND PELVIS WITH CONTRAST TECHNIQUE: Multidetector CT imaging of the chest, abdomen and pelvis was performed following the standard protocol during bolus administration of intravenous contrast. RADIATION DOSE REDUCTION: This exam was performed according to the departmental dose-optimization program which includes automated exposure control, adjustment of the mA  and/or kV according to patient size and/or use of iterative reconstruction technique. CONTRAST:  OMNIPAQUE IOHEXOL 300 MG/ML  SOLN COMPARISON:  CT chest 11/21/2022. CT chest abdomen and pelvis 10/15/2022 FINDINGS: CT CHEST FINDINGS Cardiovascular: Normal heart size. No pericardial effusions. Ascending aortic aneurysm measuring 4 cm AP diameter. No change since prior study. No aortic dissection. Great vessel origins are patent. Mediastinum/Nodes: Esophagus is decompressed. Thyroid gland is unremarkable. No significant lymphadenopathy. Lungs/Pleura: Mild linear atelectasis in the lung bases. Bullous emphysematous changes in the apices. No focal pulmonary parenchymal lesions, consolidation, or airspace disease. No pleural effusions. No pneumothorax. Musculoskeletal: No chest wall mass or suspicious bone lesions identified. CT ABDOMEN PELVIS FINDINGS Hepatobiliary: A biliary stent is in place with residual intrahepatic bile duct dilatation. Bile duct dilatation is increased since prior studies. Pneumobilia consistent with stent placement. Focal hypodense lesion demonstrated in segment 3 of the liver measuring 1.6 cm diameter. This is not demonstrated in the prior study and could be metastatic. Heterogeneous parenchymal enhancement to the liver possibly indicating vascular compromise. Increased density in the gallbladder likely due to vicarious excretion of contrast. Pancreas: Unremarkable. No pancreatic ductal dilatation or surrounding inflammatory changes. Spleen: Normal in size without focal abnormality. Adrenals/Urinary Tract: No adrenal gland nodules. Bilateral intrarenal stones. Largest on the left measures 1.2 cm diameter. No change since prior studies. No hydronephrosis or hydroureter. Bladder is normal. Stomach/Bowel: Stomach, small bowel, and colon are not abnormally distended. Wall thickening is again demonstrated in the upper rectum possibly correlating to neoplasm or radiation change. No proximal  obstruction. Vascular/Lymphatic: Aortic atherosclerosis. No enlarged abdominal or pelvic lymph nodes. Reproductive: Uterus and bilateral adnexa are unremarkable. Other: Heterogeneous peritoneal and omental nodularity with conglomerate omental nodules measuring up to 2.7 x 1.3 cm in diameter. Omental nodularity seems increased since prior study from 10/15/2022. This  may represent progressive metastatic disease. No free air or free fluid in the abdomen. 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 No focal bone lesions. IMPRESSION: 1. Ascending aortic aneurysm measuring 4 cm diameter. Recommend annual imaging followup by CTA or MRA. This recommendation follows 2010 ACCF/AHA/AATS/ACR/ASA/SCA/SCAI/SIR/STS/SVM Guidelines for the Diagnosis and Management of Patients with Thoracic Aortic Disease. Circulation. 2010; 121: R604-V409. Aortic aneurysm NOS (ICD10-I71.9) 2. No evidence of metastatic disease in the chest. Emphysematous changes in the lungs. 3. Biliary stent with residual bile duct dilatation, increasing since prior study. Possible developing nodule in the lateral segment left lobe of the liver may indicate progressive metastatic disease. Heterogeneous enhancement pattern of the liver parenchyma could indicate vascular compromise. 4. Increasing omental nodularity may indicate progressive metastatic disease. 5. Persistent wall thickening in the upper rectum possibly correlates to history of neoplasm or radiation change. 6. Aortic atherosclerosis. 7. 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 8. Bilateral nonobstructing intrarenal stones. Electronically Signed   By: Burman Nieves M.D.   On: 12/16/2022 00:43   CT Soft Tissue Neck W Contrast Result Date: 12/15/2022 CLINICAL DATA:  Right facial swelling EXAM: CT NECK WITH CONTRAST TECHNIQUE: Multidetector CT imaging of the neck was performed using the standard protocol following the bolus administration of intravenous  contrast. RADIATION DOSE REDUCTION: This exam was performed according to the departmental dose-optimization program which includes automated exposure control, adjustment of the mA and/or kV according to patient size and/or use of iterative reconstruction technique. CONTRAST:  OMNIPAQUE IOHEXOL 300 MG/ML  SOLN COMPARISON:  None Available. FINDINGS: Pharynx and larynx: Normal. No mass or swelling. Salivary glands: There is a 2 mm calculus of the distal right submandibular duct. Mild ductal dilatation. There is mild inflammatory change adjacent to the right submandibular gland. There is mild intraglandular ductal dilatation of the left submandibular gland. The parotid glands are normal. Thyroid: Normal Lymph nodes: Reactive subcentimeter right submandibular nodes. Vascular: Negative. Limited intracranial: Negative. Visualized orbits: Negative. Mastoids and visualized paranasal sinuses: Clear. Skeleton: Bulky anterior cervical osteophytes. Upper chest: Right apical bulla. Other: None IMPRESSION: 1. A 2 mm calculus of the distal right submandibular duct with mild ductal dilatation and mild inflammatory change adjacent to the right submandibular gland. 2. Mild intraglandular ductal dilatation of the left submandibular gland. Electronically Signed   By: Deatra Robinson M.D.   On: 12/15/2022 23:34   DG Chest Port 1 View Result Date: 12/15/2022 CLINICAL DATA:  Cough, chills and generalized body aches. EXAM: PORTABLE CHEST 1 VIEW COMPARISON:  July 24, 2022 FINDINGS: The heart size and mediastinal contours are within normal limits. Both lungs are clear. The visualized skeletal structures are unremarkable. IMPRESSION: No active disease. Electronically Signed   By: Aram Candela M.D.   On: 12/15/2022 20:18   IR PATIENT EVAL TECH 0-60 MINS Result Date: 12/15/2022 Arby Barrette     12/15/2022  4:25 PM  Patient ID: [name], female   DOB: Dec 09, 1975, 62 y.o.   MRN: [id number] Pt presented to IR dept today  as OP for PICC placement to assist with chemotherapy for met colon cancer. On presentation pt appeared weak and also c/o low grade fevers/chills/sweats, N/V , abd/back pain and rt sided neck adenopathy. Above was discussed with ordering team/Lisa Thomas,NP with oncology and they advised to send pt to ED for further evaluation. PICC placement canceled for now.        Subjective: Patient seen and examined at bedside.  Still having intermittent pain but improving.  Denies any worsening  vomiting, diarrhea.  Feels okay to go home today.  No fever reported.  Discharge Exam: Vitals:   12/26/22 0745 12/26/22 0800  BP:    Pulse:    Resp: 14 12  Temp:    SpO2:      General: Pt is alert, awake, not in acute distress Cardiovascular: rate controlled, S1/S2 + Respiratory: bilateral decreased breath sounds at bases Abdominal: Soft, mildly tender,, ND, bowel sounds + Extremities: no edema, no cyanosis    The results of significant diagnostics from this hospitalization (including imaging, microbiology, ancillary and laboratory) are listed below for reference.     Microbiology: Recent Results (from the past 240 hours)  Blood Culture (routine x 2)     Status: None (Preliminary result)   Collection Time: 12/24/22 12:49 AM   Specimen: BLOOD  Result Value Ref Range Status   Specimen Description   Final    BLOOD BLOOD RIGHT WRIST Performed at [facility], [address] [place], [place] [address]    Special Requests   Final    BOTTLES DRAWN AEROBIC AND ANAEROBIC Blood Culture adequate volume Performed at [facility], [address], [place], [place] [address]    Culture   Final    NO GROWTH 2 DAYS Performed at [facility], [address], [place], [place] [address]    Report Status PENDING  Incomplete  Blood Culture (routine x 2)     Status: Abnormal   Collection Time: 12/24/22 12:50 AM   Specimen: BLOOD  Result Value Ref Range Status   Specimen  Description   Final    BLOOD PICC LINE Performed at [facility], [address] [place], [place] [address]    Special Requests   Final    BOTTLES DRAWN AEROBIC AND ANAEROBIC Blood Culture adequate volume Performed at [facility], [address] [place], [place] [address]    Culture  Setup Time   Final    GRAM NEGATIVE RODS AEROBIC BOTTLE ONLY CRITICAL VALUE NOTED.  VALUE IS CONSISTENT WITH PREVIOUSLY REPORTED AND CALLED VALUE.    Culture (A)  Final    KLEBSIELLA PNEUMONIAE SUSCEPTIBILITIES PERFORMED ON PREVIOUS CULTURE WITHIN THE LAST 5 DAYS. Performed at [facility], [address] [place], [place] [address]    Report Status 12/26/2022 FINAL  Final  Culture, blood (single)     Status: Abnormal   Collection Time: 12/24/22  1:35 AM   Specimen: BLOOD  Result Value Ref Range Status   Specimen Description   Final    BLOOD PICC LINE Performed at [facility], [address] [place], [place] [address]    Special Requests   Final    BOTTLES DRAWN AEROBIC AND ANAEROBIC Blood Culture adequate volume Performed at [facility], [address], [place], [place] [address]    Culture  Setup Time   Final    GRAM NEGATIVE RODS ANAEROBIC BOTTLE ONLY CRITICAL RESULT CALLED TO, READ BACK BY AND VERIFIED WITH: PHARMD JUSTIN LEGGE ON 12/24/22 @ 1754 BY DRTQ Performed at [facility], [address] [place], [place] [address]    Culture KLEBSIELLA PNEUMONIAE (A)  Final   Report Status 12/26/2022 FINAL  Final   Organism ID, Bacteria KLEBSIELLA PNEUMONIAE  Final   Organism ID, Bacteria KLEBSIELLA PNEUMONIAE  Final      Susceptibility   Klebsiella pneumoniae - KIRBY BAUER*    CEFAZOLIN SENSITIVE Sensitive  Klebsiella pneumoniae - MIC*    AMPICILLIN RESISTANT Resistant     CEFEPIME <=0.12 SENSITIVE Sensitive     CEFTAZIDIME <=1 SENSITIVE Sensitive     CEFTRIAXONE <=0.25 SENSITIVE Sensitive      CIPROFLOXACIN <=0.25 SENSITIVE Sensitive     GENTAMICIN <=1 SENSITIVE Sensitive     IMIPENEM <=0.25 SENSITIVE Sensitive     TRIMETH/SULFA <=20 SENSITIVE Sensitive     AMPICILLIN/SULBACTAM <=2 SENSITIVE Sensitive     PIP/TAZO <=4 SENSITIVE Sensitive ug/mL    * KLEBSIELLA PNEUMONIAE    KLEBSIELLA PNEUMONIAE  Blood Culture ID Panel (Reflexed)     Status: Abnormal   Collection Time: 12/24/22  1:35 AM  Result Value Ref Range Status   Enterococcus faecalis NOT DETECTED NOT DETECTED Final   Enterococcus Faecium NOT DETECTED NOT DETECTED Final   Listeria monocytogenes NOT DETECTED NOT DETECTED Final   Staphylococcus species NOT DETECTED NOT DETECTED Final   Staphylococcus aureus (BCID) NOT DETECTED NOT DETECTED Final   Staphylococcus epidermidis NOT DETECTED NOT DETECTED Final   Staphylococcus lugdunensis NOT DETECTED NOT DETECTED Final   Streptococcus species NOT DETECTED NOT DETECTED Final   Streptococcus agalactiae NOT DETECTED NOT DETECTED Final   Streptococcus pneumoniae NOT DETECTED NOT DETECTED Final   Streptococcus pyogenes NOT DETECTED NOT DETECTED Final   A.calcoaceticus-baumannii NOT DETECTED NOT DETECTED Final   Bacteroides fragilis NOT DETECTED NOT DETECTED Final   Enterobacterales DETECTED (A) NOT DETECTED Final    Comment: Enterobacterales represent a large order of gram negative bacteria, not a single organism. CRITICAL RESULT CALLED TO, READ BACK BY AND VERIFIED WITH: PHARMD JUSTIN LEGGE ON 12/24/22 @ 1754 BY DRT    Enterobacter cloacae complex NOT DETECTED NOT DETECTED Final   Escherichia coli NOT DETECTED NOT DETECTED Final   Klebsiella aerogenes NOT DETECTED NOT DETECTED Final   Klebsiella oxytoca NOT DETECTED NOT DETECTED Final   Klebsiella pneumoniae DETECTED (A) NOT DETECTED Final    Comment: CRITICAL RESULT CALLED TO, READ BACK BY AND VERIFIED WITH: PHARMD JUSTIN LEGGE ON 12/24/22 @ 1754 BY DRT    Proteus species NOT DETECTED NOT DETECTED Final   Salmonella  species NOT DETECTED NOT DETECTED Final   Serratia marcescens NOT DETECTED NOT DETECTED Final   Haemophilus influenzae NOT DETECTED NOT DETECTED Final   Neisseria meningitidis NOT DETECTED NOT DETECTED Final   Pseudomonas aeruginosa NOT DETECTED NOT DETECTED Final   Stenotrophomonas maltophilia NOT DETECTED NOT DETECTED Final   Candida albicans NOT DETECTED NOT DETECTED Final   Candida auris NOT DETECTED NOT DETECTED Final   Candida glabrata NOT DETECTED NOT DETECTED Final   Candida krusei NOT DETECTED NOT DETECTED Final   Candida parapsilosis NOT DETECTED NOT DETECTED Final   Candida tropicalis NOT DETECTED NOT DETECTED Final   Cryptococcus neoformans/gattii NOT DETECTED NOT DETECTED Final   CTX-M ESBL NOT DETECTED NOT DETECTED Final   Carbapenem resistance IMP NOT DETECTED NOT DETECTED Final   Carbapenem resistance KPC NOT DETECTED NOT DETECTED Final   Carbapenem resistance NDM NOT DETECTED NOT DETECTED Final   Carbapenem resist OXA 48 LIKE NOT DETECTED NOT DETECTED Final   Carbapenem resistance VIM NOT DETECTED NOT DETECTED Final    Comment: Performed at [facility], [address], [place], [place] [address]  Resp panel by RT-PCR (RSV, Flu A&B, Covid) Anterior Nasal Swab     Status: None   Collection Time: 12/24/22  1:53 AM   Specimen: Anterior Nasal Swab  Result Value Ref Range Status   SARS Coronavirus  2 by RT PCR NEGATIVE NEGATIVE Final    Comment: (NOTE) SARS-CoV-2 target nucleic acids are NOT DETECTED.  The SARS-CoV-2 RNA is generally detectable in upper respiratory specimens during the acute phase of infection. The lowest concentration of SARS-CoV-2 viral copies this assay can detect is 138 copies/mL. A negative result does not preclude SARS-Cov-2 infection and should not be used as the sole basis for treatment or other patient management decisions. A negative result may occur with  improper specimen collection/handling, submission of specimen other than  nasopharyngeal swab, presence of viral mutation(s) within the areas targeted by this assay, and inadequate number of viral copies(<138 copies/mL). A negative result must be combined with clinical observations, patient history, and epidemiological information. The expected result is Negative.  Fact Sheet for Patients:  BloggerCourse.com  Fact Sheet for Healthcare Providers:  SeriousBroker.it  This test is no t yet approved or cleared by the [place] FDA and  has been authorized for detection and/or diagnosis of SARS-CoV-2 by FDA under an Emergency Use Authorization (EUA). This EUA will remain  in effect (meaning this test can be used) for the duration of the COVID-19 declaration under Section 564(b)(1) of the Act, 21 U.S.C.section 360bbb-3(b)(1), unless the authorization is terminated  or revoked sooner.       Influenza A by PCR NEGATIVE NEGATIVE Final   Influenza B by PCR NEGATIVE NEGATIVE Final    Comment: (NOTE) The Xpert Xpress SARS-CoV-2/FLU/RSV plus assay is intended as an aid in the diagnosis of influenza from Nasopharyngeal swab specimens and should not be used as a sole basis for treatment. Nasal washings and aspirates are unacceptable for Xpert Xpress SARS-CoV-2/FLU/RSV testing.  Fact Sheet for Patients: BloggerCourse.com  Fact Sheet for Healthcare Providers: SeriousBroker.it  This test is not yet approved or cleared by the [place] FDA and has been authorized for detection and/or diagnosis of SARS-CoV-2 by FDA under an Emergency Use Authorization (EUA). This EUA will remain in effect (meaning this test can be used) for the duration of the COVID-19 declaration under Section 564(b)(1) of the Act, 21 U.S.C. section 360bbb-3(b)(1), unless the authorization is terminated or revoked.     Resp Syncytial Virus by PCR NEGATIVE NEGATIVE Final    Comment:  (NOTE) Fact Sheet for Patients: BloggerCourse.com  Fact Sheet for Healthcare Providers: SeriousBroker.it  This test is not yet approved or cleared by the [place] FDA and has been authorized for detection and/or diagnosis of SARS-CoV-2 by FDA under an Emergency Use Authorization (EUA). This EUA will remain in effect (meaning this test can be used) for the duration of the COVID-19 declaration under Section 564(b)(1) of the Act, 21 U.S.C. section 360bbb-3(b)(1), unless the authorization is terminated or revoked.  Performed at [facility], [address], [place], [place] [address]      Labs: BNP (last 3 results) No results for input(s): "BNP" in the last 8760 hours. Basic Metabolic Panel: Recent Labs  Lab 12/22/22 0821 12/24/22 0049 12/24/22 0803 12/25/22 0426 12/26/22 0436  NA 136 132* 134* 133* 135  K 3.3* 2.7* 2.9* 3.6 3.4*  CL 95* 93* 97* 99 95*  CO2 31 26 27 26 29   GLUCOSE 111* 109* 98 87 103*  BUN 16 8 8 6 7   CREATININE 0.63 0.49 0.43* 0.40* 0.45  CALCIUM 10.0 9.3 8.6* 8.6* 9.2  MG 1.8  --  1.7  --  2.1  PHOS  --   --  3.1  --   --    Liver  Function Tests: Recent Labs  Lab 12/22/22 0821 12/24/22 0049 12/25/22 0426 12/26/22 0436  AST 47* 47* 47* 55*  ALT 64* 55* 43 51*  ALKPHOS 368* 354* 293* 357*  BILITOT 0.2 0.5 0.5 0.2  PROT 8.4* 8.4* 7.1 7.9  ALBUMIN 4.3 3.8 3.1* 3.5   No results for input(s): "LIPASE", "AMYLASE" in the last 168 hours. No results for input(s): "AMMONIA" in the last 168 hours. CBC: Recent Labs  Lab 12/22/22 0821 12/24/22 0049 12/25/22 0426 12/26/22 0436  WBC 8.5 10.7* 6.2 6.7  NEUTROABS 3.6 8.1*  --  2.6  HGB 13.2 13.5 11.2* 12.3  HCT 39.9 39.7 33.4* 36.4  MCV 90.7 90.6 91.8 91.2  PLT 382 318 259 297   Cardiac Enzymes: No results for input(s): "CKTOTAL", "CKMB", "CKMBINDEX", "TROPONINI" in the last 168 hours. BNP: Invalid input(s):  "POCBNP" CBG: No results for input(s): "GLUCAP" in the last 168 hours. D-Dimer No results for input(s): "DDIMER" in the last 72 hours. Hgb A1c No results for input(s): "HGBA1C" in the last 72 hours. Lipid Profile No results for input(s): "CHOL", "HDL", "LDLCALC", "TRIG", "CHOLHDL", "LDLDIRECT" in the last 72 hours. Thyroid function studies No results for input(s): "TSH", "T4TOTAL", "T3FREE", "THYROIDAB" in the last 72 hours.  Invalid input(s): "FREET3" Anemia work up No results for input(s): "VITAMINB12", "FOLATE", "FERRITIN", "TIBC", "IRON", "RETICCTPCT" in the last 72 hours. Urinalysis    Component Value Date/Time   COLORURINE YELLOW 12/24/2022 0613   APPEARANCEUR CLEAR 12/24/2022 0613   LABSPEC >1.046 (H) 12/24/2022 0613   PHURINE 5.0 12/24/2022 0613   GLUCOSEU NEGATIVE 12/24/2022 0613   HGBUR SMALL (A) 12/24/2022 0613   BILIRUBINUR NEGATIVE 12/24/2022 0613   KETONESUR NEGATIVE 12/24/2022 0613   PROTEINUR NEGATIVE 12/24/2022 0613   NITRITE NEGATIVE 12/24/2022 0613   LEUKOCYTESUR NEGATIVE 12/24/2022 0613   Sepsis Labs Recent Labs  Lab 12/22/22 0821 12/24/22 0049 12/25/22 0426 12/26/22 0436  WBC 8.5 10.7* 6.2 6.7   Microbiology Recent Results (from the past 240 hours)  Blood Culture (routine x 2)     Status: None (Preliminary result)   Collection Time: 12/24/22 12:49 AM   Specimen: BLOOD  Result Value Ref Range Status   Specimen Description   Final    BLOOD BLOOD RIGHT WRIST Performed at [facility], [address], [place], [place] [address]    Special Requests   Final    BOTTLES DRAWN AEROBIC AND ANAEROBIC Blood Culture adequate volume Performed at [facility], [address] [place], [place] [address]    Culture   Final    NO GROWTH 2 DAYS Performed at [facility], [address], [place], [place] [address]    Report Status PENDING  Incomplete  Blood Culture (routine x 2)     Status: Abnormal   Collection Time: 12/24/22  12:50 AM   Specimen: BLOOD  Result Value Ref Range Status   Specimen Description   Final    BLOOD PICC LINE Performed at [facility], [address], [place], [place] [address]    Special Requests   Final    BOTTLES DRAWN AEROBIC AND ANAEROBIC Blood Culture adequate volume Performed at [facility], [address], [place], [place] [address]    Culture  Setup Time   Final    GRAM NEGATIVE RODS AEROBIC BOTTLE ONLY CRITICAL VALUE NOTED.  VALUE IS CONSISTENT WITH PREVIOUSLY REPORTED AND CALLED VALUE.    Culture (A)  Final    KLEBSIELLA  PNEUMONIAE SUSCEPTIBILITIES PERFORMED ON PREVIOUS CULTURE WITHIN THE LAST 5 DAYS. Performed at [facility], [address] [place], [place] [address]    Report Status 12/26/2022 FINAL  Final  Culture, blood (single)     Status: Abnormal   Collection Time: 12/24/22  1:35 AM   Specimen: BLOOD  Result Value Ref Range Status   Specimen Description   Final    BLOOD PICC LINE Performed at [facility], [address], [place], [place] [address]    Special Requests   Final    BOTTLES DRAWN AEROBIC AND ANAEROBIC Blood Culture adequate volume Performed at [facility], [address] [place], [place] [address]    Culture  Setup Time   Final    GRAM NEGATIVE RODS ANAEROBIC BOTTLE ONLY CRITICAL RESULT CALLED TO, READ BACK BY AND VERIFIED WITH: PHARMD JUSTIN LEGGE ON 12/24/22 @ 1754 BY DRTQ Performed at [facility], [address] [place], [place] [address]    Culture KLEBSIELLA PNEUMONIAE (A)  Final   Report Status 12/26/2022 FINAL  Final   Organism ID, Bacteria KLEBSIELLA PNEUMONIAE  Final   Organism ID, Bacteria KLEBSIELLA PNEUMONIAE  Final      Susceptibility   Klebsiella pneumoniae - KIRBY BAUER*    CEFAZOLIN SENSITIVE Sensitive    Klebsiella pneumoniae - MIC*    AMPICILLIN RESISTANT Resistant     CEFEPIME <=0.12 SENSITIVE Sensitive     CEFTAZIDIME <=1  SENSITIVE Sensitive     CEFTRIAXONE <=0.25 SENSITIVE Sensitive     CIPROFLOXACIN <=0.25 SENSITIVE Sensitive     GENTAMICIN <=1 SENSITIVE Sensitive     IMIPENEM <=0.25 SENSITIVE Sensitive     TRIMETH/SULFA <=20 SENSITIVE Sensitive     AMPICILLIN/SULBACTAM <=2 SENSITIVE Sensitive     PIP/TAZO <=4 SENSITIVE Sensitive ug/mL    * KLEBSIELLA PNEUMONIAE    KLEBSIELLA PNEUMONIAE  Blood Culture ID Panel (Reflexed)     Status: Abnormal   Collection Time: 12/24/22  1:35 AM  Result Value Ref Range Status   Enterococcus faecalis NOT DETECTED NOT DETECTED Final   Enterococcus Faecium NOT DETECTED NOT DETECTED Final   Listeria monocytogenes NOT DETECTED NOT DETECTED Final   Staphylococcus species NOT DETECTED NOT DETECTED Final   Staphylococcus aureus (BCID) NOT DETECTED NOT DETECTED Final   Staphylococcus epidermidis NOT DETECTED NOT DETECTED Final   Staphylococcus lugdunensis NOT DETECTED NOT DETECTED Final   Streptococcus species NOT DETECTED NOT DETECTED Final   Streptococcus agalactiae NOT DETECTED NOT DETECTED Final   Streptococcus pneumoniae NOT DETECTED NOT DETECTED Final   Streptococcus pyogenes NOT DETECTED NOT DETECTED Final   A.calcoaceticus-baumannii NOT DETECTED NOT DETECTED Final   Bacteroides fragilis NOT DETECTED NOT DETECTED Final   Enterobacterales DETECTED (A) NOT DETECTED Final    Comment: Enterobacterales represent a large order of gram negative bacteria, not a single organism. CRITICAL RESULT CALLED TO, READ BACK BY AND VERIFIED WITH: PHARMD JUSTIN LEGGE ON 12/24/22 @ 1754 BY DRT    Enterobacter cloacae complex NOT DETECTED NOT DETECTED Final   Escherichia coli NOT DETECTED NOT DETECTED Final   Klebsiella aerogenes NOT DETECTED NOT DETECTED Final   Klebsiella oxytoca NOT DETECTED NOT DETECTED Final   Klebsiella pneumoniae DETECTED (A) NOT DETECTED Final    Comment: CRITICAL RESULT CALLED TO, READ BACK BY AND VERIFIED WITH: PHARMD JUSTIN LEGGE ON 12/24/22 @ 1754 BY DRT     Proteus species NOT DETECTED NOT DETECTED Final   Salmonella species NOT DETECTED NOT DETECTED Final  Serratia marcescens NOT DETECTED NOT DETECTED Final   Haemophilus influenzae NOT DETECTED NOT DETECTED Final   Neisseria meningitidis NOT DETECTED NOT DETECTED Final   Pseudomonas aeruginosa NOT DETECTED NOT DETECTED Final   Stenotrophomonas maltophilia NOT DETECTED NOT DETECTED Final   Candida albicans NOT DETECTED NOT DETECTED Final   Candida auris NOT DETECTED NOT DETECTED Final   Candida glabrata NOT DETECTED NOT DETECTED Final   Candida krusei NOT DETECTED NOT DETECTED Final   Candida parapsilosis NOT DETECTED NOT DETECTED Final   Candida tropicalis NOT DETECTED NOT DETECTED Final   Cryptococcus neoformans/gattii NOT DETECTED NOT DETECTED Final   CTX-M ESBL NOT DETECTED NOT DETECTED Final   Carbapenem resistance IMP NOT DETECTED NOT DETECTED Final   Carbapenem resistance KPC NOT DETECTED NOT DETECTED Final   Carbapenem resistance NDM NOT DETECTED NOT DETECTED Final   Carbapenem resist OXA 48 LIKE NOT DETECTED NOT DETECTED Final   Carbapenem resistance VIM NOT DETECTED NOT DETECTED Final    Comment: Performed at [facility], [address], [place], [place] [address]  Resp panel by RT-PCR (RSV, Flu A&B, Covid) Anterior Nasal Swab     Status: None   Collection Time: 12/24/22  1:53 AM   Specimen: Anterior Nasal Swab  Result Value Ref Range Status   SARS Coronavirus 2 by RT PCR NEGATIVE NEGATIVE Final    Comment: (NOTE) SARS-CoV-2 target nucleic acids are NOT DETECTED.  The SARS-CoV-2 RNA is generally detectable in upper respiratory specimens during the acute phase of infection. The lowest concentration of SARS-CoV-2 viral copies this assay can detect is 138 copies/mL. A negative result does not preclude SARS-Cov-2 infection and should not be used as the sole basis for treatment or other patient management decisions. A negative result may occur with  improper specimen  collection/handling, submission of specimen other than nasopharyngeal swab, presence of viral mutation(s) within the areas targeted by this assay, and inadequate number of viral copies(<138 copies/mL). A negative result must be combined with clinical observations, patient history, and epidemiological information. The expected result is Negative.  Fact Sheet for Patients:  BloggerCourse.com  Fact Sheet for Healthcare Providers:  SeriousBroker.it  This test is no t yet approved or cleared by the [place] FDA and  has been authorized for detection and/or diagnosis of SARS-CoV-2 by FDA under an Emergency Use Authorization (EUA). This EUA will remain  in effect (meaning this test can be used) for the duration of the COVID-19 declaration under Section 564(b)(1) of the Act, 21 U.S.C.section 360bbb-3(b)(1), unless the authorization is terminated  or revoked sooner.       Influenza A by PCR NEGATIVE NEGATIVE Final   Influenza B by PCR NEGATIVE NEGATIVE Final    Comment: (NOTE) The Xpert Xpress SARS-CoV-2/FLU/RSV plus assay is intended as an aid in the diagnosis of influenza from Nasopharyngeal swab specimens and should not be used as a sole basis for treatment. Nasal washings and aspirates are unacceptable for Xpert Xpress SARS-CoV-2/FLU/RSV testing.  Fact Sheet for Patients: BloggerCourse.com  Fact Sheet for Healthcare Providers: SeriousBroker.it  This test is not yet approved or cleared by the [place] FDA and has been authorized for detection and/or diagnosis of SARS-CoV-2 by FDA under an Emergency Use Authorization (EUA). This EUA will remain in effect (meaning this test can be used) for the duration of the COVID-19 declaration under Section 564(b)(1) of the Act, 21 U.S.C. section 360bbb-3(b)(1), unless the authorization is terminated or revoked.     Resp Syncytial  Virus  by PCR NEGATIVE NEGATIVE Final    Comment: (NOTE) Fact Sheet for Patients: BloggerCourse.com  Fact Sheet for Healthcare Providers: SeriousBroker.it  This test is not yet approved or cleared by the [place] FDA and has been authorized for detection and/or diagnosis of SARS-CoV-2 by FDA under an Emergency Use Authorization (EUA). This EUA will remain in effect (meaning this test can be used) for the duration of the COVID-19 declaration under Section 564(b)(1) of the Act, 21 U.S.C. section 360bbb-3(b)(1), unless the authorization is terminated or revoked.  Performed at [facility], [address] [place], [place] [address]      Time coordinating discharge: 35 minutes  SIGNED:   Glade Lloyd, MD  [facility] 12/26/2022, 10:26 AM

## 2022-12-26 NOTE — TOC CM/SW Note (Signed)
Transition of Care [facility]) - Inpatient Brief Assessment   Patient Details  Name: April [name] MRN: [id number] Date of Birth: 1975-02-10  Transition of Care [facility]) CM/SW Contact:    Larrie Kass, LCSW Phone Number: 12/26/2022, 11:17 AM     Transition of Care Asessment: Insurance and Status: Insurance coverage has been reviewed Patient has primary care physician: Yes Home environment has been reviewed: home with self Prior level of function:: independent Prior/Current Home Services: No current home services Social Drivers of Health Review: SDOH reviewed no interventions necessary Readmission risk has been reviewed: Yes Transition of care needs: no transition of care needs at this time

## 2022-12-27 ENCOUNTER — Other Ambulatory Visit (HOSPITAL_COMMUNITY): Payer: Self-pay

## 2022-12-29 ENCOUNTER — Inpatient Hospital Stay: Payer: 59

## 2022-12-29 ENCOUNTER — Other Ambulatory Visit: Payer: 59

## 2022-12-29 ENCOUNTER — Other Ambulatory Visit: Payer: Self-pay

## 2022-12-29 ENCOUNTER — Ambulatory Visit: Payer: 59

## 2022-12-29 ENCOUNTER — Inpatient Hospital Stay: Payer: 59 | Admitting: Oncology

## 2022-12-29 ENCOUNTER — Inpatient Hospital Stay: Payer: 59 | Admitting: Nurse Practitioner

## 2022-12-29 ENCOUNTER — Telehealth: Payer: Self-pay

## 2022-12-29 VITALS — BP 110/90 | HR 92 | Temp 97.9°F | Resp 16

## 2022-12-29 DIAGNOSIS — K831 Obstruction of bile duct: Secondary | ICD-10-CM

## 2022-12-29 DIAGNOSIS — C19 Malignant neoplasm of rectosigmoid junction: Secondary | ICD-10-CM

## 2022-12-29 DIAGNOSIS — Z95828 Presence of other vascular implants and grafts: Secondary | ICD-10-CM

## 2022-12-29 DIAGNOSIS — Z5112 Encounter for antineoplastic immunotherapy: Secondary | ICD-10-CM | POA: Diagnosis present

## 2022-12-29 DIAGNOSIS — Z79899 Other long term (current) drug therapy: Secondary | ICD-10-CM | POA: Diagnosis not present

## 2022-12-29 DIAGNOSIS — C20 Malignant neoplasm of rectum: Secondary | ICD-10-CM

## 2022-12-29 DIAGNOSIS — C187 Malignant neoplasm of sigmoid colon: Secondary | ICD-10-CM | POA: Diagnosis present

## 2022-12-29 DIAGNOSIS — Z5111 Encounter for antineoplastic chemotherapy: Secondary | ICD-10-CM | POA: Diagnosis present

## 2022-12-29 DIAGNOSIS — T85528A Displacement of other gastrointestinal prosthetic devices, implants and grafts, initial encounter: Secondary | ICD-10-CM

## 2022-12-29 DIAGNOSIS — Z452 Encounter for adjustment and management of vascular access device: Secondary | ICD-10-CM | POA: Diagnosis not present

## 2022-12-29 DIAGNOSIS — Z9889 Other specified postprocedural states: Secondary | ICD-10-CM

## 2022-12-29 DIAGNOSIS — C7951 Secondary malignant neoplasm of bone: Secondary | ICD-10-CM | POA: Diagnosis not present

## 2022-12-29 LAB — CULTURE, BLOOD (ROUTINE X 2)
Culture: NO GROWTH
Special Requests: ADEQUATE

## 2022-12-29 MED ORDER — HEPARIN SOD (PORK) LOCK FLUSH 100 UNIT/ML IV SOLN
500.0000 [IU] | Freq: Once | INTRAVENOUS | Status: AC
  Administered 2022-12-29: 500 [IU]

## 2022-12-29 MED ORDER — SODIUM CHLORIDE 0.9% FLUSH
10.0000 mL | Freq: Once | INTRAVENOUS | Status: AC
  Administered 2022-12-29: 10 mL

## 2022-12-29 NOTE — Telephone Encounter (Signed)
ERCP has been set up for 02/02/23 at 1130 am at [facility] with GM.   Left message on machine to call back

## 2022-12-29 NOTE — Telephone Encounter (Signed)
-----   Message from [facility] sent at 12/25/2022  5:14 PM EST ----- Regarding: ERCP exchange stent April [name], This patient is admitted to the hospital. It is not clear that she needs biliary stent exchange currently but we should consider that.  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 Put the date down and then will relay to where while she is here in the hospital. Thanks. GM  FYI JZ that you can put in your note tomorrow.

## 2022-12-29 NOTE — Patient Instructions (Signed)
PICC Home Care Guide A peripherally inserted central catheter (PICC) is a form of IV access that allows medicines and IV fluids to be quickly put into the blood and spread throughout the body. The PICC is a long, thin, flexible tube (catheter) that is put into a vein in a person's arm or leg. The catheter ends in a large vein just outside the heart called the superior vena cava (SVC). After the PICC is put in, a chest X-ray may be done to make sure that it is in the right place. A PICC may be placed for different reasons, such as: To give medicines and liquid nutrition. To give IV fluids and blood products. To take blood samples often. If there is trouble placing a peripheral intravenous (PIV) catheter. If cared for properly, a PICC can remain in place for many months. Having a PICC can allow you to go home from the hospital sooner and continue treatment at home. Medicines and PICC care can be managed at home by a family member, caregiver, or home health care team. What are the risks? Generally, having a PICC is safe. However, problems may occur, including: A blood clot (thrombus) forming in or at the end of the PICC. A blood clot forming in a vein (deep vein thrombosis) or traveling to the lung (pulmonary embolism). Inflammation of the vein (phlebitis) in which the PICC is placed. Infection at the insertion site or in the blood. Blood infections from central lines, like PICCs, can be serious and often require a hospital stay. PICC malposition, or PICC movement or poor placement. A break or cut in the PICC. Do not use scissors near the PICC. Nerve or tendon irritation or injury during PICC insertion. How to care for your PICC Please follow the specific guidelines provided by your health care provider. Preventing infection You and any caregivers should wash your hands often with soap and water for at least 20 seconds. Wash hands: Before touching the PICC or the infusion device. Before changing a  bandage (dressing). Do not change the dressing unless you have been taught to do so and have shown you are able to change it safely. Flush the PICC as told. Tell your health care provider right away if the PICC is hard to flush or does not flush. Do not use force to flush the PICC. Use clean and germ-free (sterile) supplies only. Keep the supplies in a dry place. Do not reuse needles, syringes, or any other supplies. Reusing supplies can lead to infection. Keep the PICC dressing dry and secure it with tape if the edges stop sticking to your skin. Check your PICC insertion site every day for signs of infection. Check for: Redness, swelling, or pain. Fluid or blood. Warmth. Pus or a bad smell. Preventing other problems Do not use a syringe that is less than 10 mL to flush the PICC. Do not have your blood pressure checked on the arm in which the PICC is placed. Do not ever pull or tug on the PICC. Keep it secured to your arm with tape or a stretch wrap when not in use. Do not take the PICC out yourself. Only a trained health care provider should remove the PICC. Keep pets and children away from your PICC. How to care for your PICC dressing Keep your PICC dressing clean and dry to prevent infection. Do not take baths, swim, or use a hot tub until your health care provider approves. Ask your health care provider if you can take  showers. You may only be allowed to take sponge baths. When you are allowed to shower: Ask your health care provider to teach you how to wrap the PICC. Cover the PICC with clear plastic wrap and tape to keep it dry while showering. Follow instructions from your health care provider about how to take care of your insertion site and dressing. Make sure you: Wash your hands with soap and water for at least 20 seconds before and after you change your dressing. If soap and water are not available, use hand sanitizer. Change your dressing only if taught to do so by your health care  provider. Your PICC dressing needs to be changed if it becomes loose or wet. Leave stitches (sutures), skin glue, or adhesive strips in place. These skin closures may need to stay in place for 2 weeks or longer. If adhesive strip edges start to loosen and curl up, you may trim the loose edges. Do not remove adhesive strips completely unless your health care provider tells you to do that. Follow these instructions at home: Disposal of supplies Throw away any syringes in a disposal container that is meant for sharp items (sharps container). You can buy a sharps container from a pharmacy, or you can make one by using an empty, hard plastic bottle with a lid. Place any used dressings or infusion bags into a plastic bag. Throw that bag in the trash. General instructions  Always carry your PICC identification card or wear a medical alert bracelet. Keep the tube clamped at all times, unless it is being used. Always carry a smooth-edge clamp with you to clamp the PICC if it breaks. Do not use scissors or sharp objects near the tube. You may bend your arm and move it freely. If your PICC is near or at the bend of your elbow, avoid activity with repeated motion at the elbow. Avoid lifting heavy objects as told by your health care provider. Keep all follow-up visits. This is important. You will need to have your PICC dressing changed at least once a week. Contact a health care provider if: You have pain in your arm, ear, face, or teeth. You have a fever or chills. You have redness, swelling, or pain around the insertion site. You have fluid or blood coming from the insertion site. Your insertion site feels warm to the touch. You have pus or a bad smell coming from the insertion site. Your skin feels hard and raised around the insertion site. Your PICC dressing has gotten wet or is coming off and you have not been taught how to change it. Get help right away if: You have problems with your PICC, such as  your PICC: Was tugged or pulled and has partially come out. Do not  push the PICC back in. Cannot be flushed, is hard to flush, or leaks around the insertion site when it is flushed. Makes a flushing sound when it is flushed. Appears to have a hole or tear. Is accidentally pulled all the way out. If this happens, cover the insertion site with a gauze dressing. Do not throw the PICC away. Your health care provider will need to check it to be sure the entire catheter came out. You feel your heart racing or skipping beats, or you have chest pain. You have shortness of breath or trouble breathing. You have swelling, redness, warmth, or pain in the arm in which the PICC is placed. You have a red streak going up your arm that  starts under the PICC dressing. These symptoms may be an emergency. Get help right away. Call 911. Do not wait to see if the symptoms will go away. Do not drive yourself to the hospital. Summary A peripherally inserted central catheter (PICC) is a long, thin, flexible tube (catheter) that is put into a vein in the arm or leg. If cared for properly, a PICC can remain in place for many months. Having a PICC can allow you to go home from the hospital sooner and continue treatment at home. The PICC is inserted using a germ-free (sterile) technique by a specially trained health care provider. Only a trained health care provider should remove it. Do not have your blood pressure checked on the arm in which your PICC is placed. Always keep your PICC identification card with you. This information is not intended to replace advice given to you by your health care provider. Make sure you discuss any questions you have with your health care provider. Document Revised: 07/18/2020 Document Reviewed: 07/18/2020 Elsevier Patient Education  2024 [employer].

## 2022-12-30 ENCOUNTER — Ambulatory Visit: Payer: 59 | Admitting: Nurse Practitioner

## 2022-12-30 ENCOUNTER — Encounter: Payer: Self-pay | Admitting: Nurse Practitioner

## 2022-12-30 VITALS — BP 130/80 | HR 105 | Wt 138.0 lb

## 2022-12-30 DIAGNOSIS — I7121 Aneurysm of the ascending aorta, without rupture: Secondary | ICD-10-CM

## 2022-12-30 DIAGNOSIS — C189 Malignant neoplasm of colon, unspecified: Secondary | ICD-10-CM

## 2022-12-30 DIAGNOSIS — C19 Malignant neoplasm of rectosigmoid junction: Secondary | ICD-10-CM | POA: Diagnosis not present

## 2022-12-30 DIAGNOSIS — G63 Polyneuropathy in diseases classified elsewhere: Secondary | ICD-10-CM

## 2022-12-30 DIAGNOSIS — C787 Secondary malignant neoplasm of liver and intrahepatic bile duct: Secondary | ICD-10-CM

## 2022-12-30 DIAGNOSIS — C801 Malignant (primary) neoplasm, unspecified: Secondary | ICD-10-CM

## 2022-12-30 DIAGNOSIS — G893 Neoplasm related pain (acute) (chronic): Secondary | ICD-10-CM

## 2022-12-30 DIAGNOSIS — I10 Essential (primary) hypertension: Secondary | ICD-10-CM | POA: Diagnosis not present

## 2022-12-30 DIAGNOSIS — Z09 Encounter for follow-up examination after completed treatment for conditions other than malignant neoplasm: Secondary | ICD-10-CM

## 2022-12-30 MED ORDER — CHLORTHALIDONE 25 MG PO TABS: 37.5000 mg | ORAL_TABLET | Freq: Every day | ORAL | 1 refills | Status: DC

## 2022-12-30 MED ORDER — CLONIDINE HCL 0.1 MG PO TABS: 0.1000 mg | ORAL_TABLET | Freq: Three times a day (TID) | ORAL | 3 refills | Status: DC

## 2022-12-30 MED ORDER — CHLORTHALIDONE 25 MG PO TABS: 25.0000 mg | ORAL_TABLET | Freq: Every day | ORAL | Status: DC

## 2022-12-30 NOTE — Progress Notes (Unsigned)
Shawna Clamp, DNP, AGNP-c [facility]  [address] [place], [place] [address] [phone]  ESTABLISHED PATIENT- Chronic Health and/or Follow-Up Visit  Blood pressure 130/80, pulse (!) 105, weight 138 lb (62.6 kg), last menstrual period 01/07/2006.    April [name] is a 47 y.o. year old female presenting today for evaluation and management of chronic conditions.   History of Present Illness April [name], a 47 year old individual with a history of hypertension, colon cancer, and recent sepsis, presents for a hypertension reevaluation. She was recently hospitalized due to sepsis, which was caused by Klebsiella bacteria in the bloodstream. She reports she is doing better and continuing her antibiotic.    The patient reports experiencing daily cancer related pain, which she rates as 10 out of 10 without pain medication and around 4 out of 10 after taking pain medication. The pain is severe enough to affect her breathing and is only partially relieved by medication.  She is currently undergoing chemotherapy on a weekly basis. She has been told she will remain on this for the rest of her life. She reports when she has stopped the treatment the pain intensifies as the cancer grows and becomes unbearable.   The patient has been taking amlodipine, metoprolol, and chlorthalidone for hypertension. She reports that her blood pressure readings at home are consistently around 140/90, which she feels is a bit high. She has been taking her medications daily, with chlorthalidone taken once a week. She also reports experiencing dizziness, even when sitting or lying down.  In addition to her hypertension and pain, the patient has been dealing with numbness in her feet, which she believes is a side effect of her chemotherapy treatment for colon cancer. She also reports difficulty drinking enough fluids, especially when she is feeling unwell. Her next cancer treatment infusion is  scheduled for the day before Christmas, and a procedure to replace a stent is scheduled for January.  The patient's recent labs were taken four days ago, and she is currently taking a potassium supplement. She also reports that taking chlorthalidone once a week gives her a headache, but she has not noticed a headache when taking it daily. She has not noticed any significant swelling in her feet.  All ROS negative with exception of what is listed above.   PHYSICAL EXAM Physical Exam   PLAN Problem List Items Addressed This Visit     Hypertension   Hypertension managed with amlodipine, metoprolol, and chlorthalidone. Home BP readings are around 140/90 mmHg, slightly elevated. Pain from colon cancer treatment may contribute to elevated BP. Considered increasing chlorthalidone but decided against it due to fluid balance concerns. Adding clonidine as needed for better control was discussed. - Maintain current chlorthalidone dosage at 25 mg daily - Add clonidine as needed for systolic BP > 140 mmHg or diastolic BP > 90 mmHg - Send new prescription for clonidine to [facility] on [address]      Relevant Medications   cloNIDine (CATAPRES) 0.1 MG tablet   chlorthalidone (HYGROTON) 25 MG tablet   Colorectal cancer, stage IV (HCC) - Primary   Active colon cancer with significant pain, rated 10/10 without medication and reduced to 4/10 with medication. Pain management is crucial for quality of life and may impact blood pressure control. Treatments to continue weakly indefinitely at this time due to uncontrolled pain when stopped.  - Continue current pain management regimen as prescribed by Dr. Truett Perna - Monitor pain levels and adjust pain management as needed  Hospital discharge follow-up   Recent hospitalization for Klebsiella sepsis, likely due to immunosuppression from cancer treatment. Discharged on Friday and currently recovering. Emphasized the importance of hydration and completion of  antibiotics.  - Monitor for signs of recurrent infection - Ensure adequate hydration        Neuropathy associated with cancer (HCC)   Experiencing neuropathy in the feet, likely due to chemotherapy. Reports lack of sensation, which has not yet resolved. Oncologist indicated that sensation may return over time. - Continue monitoring neuropathy symptoms - Discuss with oncologist if symptoms persist or worsen      Ascending aortic aneurysm (HCC)   Relevant Medications   cloNIDine (CATAPRES) 0.1 MG tablet   chlorthalidone (HYGROTON) 25 MG tablet   Cancer related pain   Metastatic colon cancer to liver (HCC)    No follow-ups on file.  Shawna Clamp, DNP, AGNP-c

## 2022-12-30 NOTE — Patient Instructions (Addendum)
If your systolic (top) blood pressure is higher than 140 and or the diastolic (bottom) number is higher than 90, take the clonidine. You can take this up to every 8 hours.

## 2022-12-30 NOTE — Telephone Encounter (Signed)
Left message on machine to call back  

## 2022-12-31 ENCOUNTER — Encounter: Payer: Self-pay | Admitting: Nurse Practitioner

## 2022-12-31 ENCOUNTER — Encounter: Payer: Self-pay | Admitting: Gastroenterology

## 2022-12-31 ENCOUNTER — Ambulatory Visit: Payer: 59 | Admitting: Gastroenterology

## 2022-12-31 VITALS — BP 102/70 | HR 112 | Ht 64.0 in | Wt 136.1 lb

## 2022-12-31 DIAGNOSIS — K5909 Other constipation: Secondary | ICD-10-CM

## 2022-12-31 DIAGNOSIS — C189 Malignant neoplasm of colon, unspecified: Secondary | ICD-10-CM

## 2022-12-31 DIAGNOSIS — K838 Other specified diseases of biliary tract: Secondary | ICD-10-CM | POA: Diagnosis not present

## 2022-12-31 DIAGNOSIS — B961 Klebsiella pneumoniae [K. pneumoniae] as the cause of diseases classified elsewhere: Secondary | ICD-10-CM

## 2022-12-31 DIAGNOSIS — Z9889 Other specified postprocedural states: Secondary | ICD-10-CM

## 2022-12-31 DIAGNOSIS — Z09 Encounter for follow-up examination after completed treatment for conditions other than malignant neoplasm: Secondary | ICD-10-CM | POA: Insufficient documentation

## 2022-12-31 DIAGNOSIS — R7881 Bacteremia: Secondary | ICD-10-CM

## 2022-12-31 DIAGNOSIS — C787 Secondary malignant neoplasm of liver and intrahepatic bile duct: Secondary | ICD-10-CM

## 2022-12-31 DIAGNOSIS — C801 Malignant (primary) neoplasm, unspecified: Secondary | ICD-10-CM | POA: Insufficient documentation

## 2022-12-31 DIAGNOSIS — R933 Abnormal findings on diagnostic imaging of other parts of digestive tract: Secondary | ICD-10-CM | POA: Insufficient documentation

## 2022-12-31 MED ORDER — LINACLOTIDE 72 MCG PO CAPS: 72.0000 ug | ORAL_CAPSULE | Freq: Every day | ORAL | Status: DC

## 2022-12-31 NOTE — Assessment & Plan Note (Signed)
Hypertension managed with amlodipine, metoprolol, and chlorthalidone. Home BP readings are around 140/90 mmHg, slightly elevated. Pain from colon cancer treatment may contribute to elevated BP. Considered increasing chlorthalidone but decided against it due to fluid balance concerns. Adding clonidine as needed for better control was discussed. - Maintain current chlorthalidone dosage at 25 mg daily - Add clonidine as needed for systolic BP > 140 mmHg or diastolic BP > 90 mmHg - Send new prescription for clonidine to [facility] on [address]

## 2022-12-31 NOTE — Assessment & Plan Note (Signed)
Recent hospitalization for Klebsiella sepsis, likely due to immunosuppression from cancer treatment. Discharged on Friday and currently recovering. Emphasized the importance of hydration and completion of antibiotics.  - Monitor for signs of recurrent infection - Ensure adequate hydration

## 2022-12-31 NOTE — Progress Notes (Signed)
GASTROENTEROLOGY OUTPATIENT CLINIC VISIT   Primary Care Provider Early, Sung Amabile, NP [address] [place] [address] [phone]  Patient Profile: April [name] is a 47 y.o. female with a pmh significant for hypertension, nephrolithiasis, metastatic colon cancer (complicated by biliary obstruction status post ERCP with stenting), chronic pain, constipation.  The patient presents to the [facility] for an evaluation and management of problem(s) noted below:  Problem List 1. Intrahepatic bile duct dilation   2. History of biliary stent insertion   3. History of ERCP   4. Metastatic colon cancer to liver (HCC)   5. Chronic constipation   6. Bacteremia due to Klebsiella pneumoniae   7. Imaging of gastrointestinal tract abnormal    Discussed the use of AI scribe software for clinical note transcription with the patient, who gave verbal consent to proceed.  History of Present Illness Please see prior GI notes for full details of HPI.  Interval History The patient presents for follow-up with her family member.  She was recently seen in the hospital with Klebsiella bacteremia.  She had been experiencing issues of nausea and vomiting and abdominal pain with associated diarrhea prior to coming into the hospital.  Imaging is documented below but was suggestive of a possible enterocolitis.  There was some concern for biliary obstruction as well with slightly elevated LFTs though these improved somewhat with antibiotic therapy.  She has completed her antibiotics and overall is feeling improved.  However, she reports that her stool has become more solid and hard, and she has gone back to having constipation as she has had in the past.   The patient is currently on MiraLAX daily for bowel regulation as well as occasionally lactulose.  In the past with Linzess (unclear of the dosing), she reported explosive diarrhea when being on Linzess so she is somewhat  worried.  She wonders whether Amitiza could be helpful.  She is not scheduled for ERCP with stent exchange in January.  She denies any further significant abdominal pain out of proportion to her already known pain.  The patient does not have any evidence of progressive jaundice or dark in urine at this point in time.  She has no generalized pruritus.  She is hopeful that [facility] clinical trial will be helpful for her treatment.  GI Review of Systems Positive as above Negative for dysphagia, odynophagia, nausea, vomiting  Review of Systems General: Denies fevers/chills/weight loss unintentionally Cardiovascular: Denies chest pain Pulmonary: Denies shortness of breath/cough Gastroenterological: See HPI Genitourinary: Denies darkened urine Hematological: Denies easy bruising/bleeding Dermatological: Denies jaundice Psychological: Mood is stable   Medications Current Outpatient Medications  Medication Sig Dispense Refill   amLODipine (NORVASC) 10 MG tablet Take 1 tablet (10 mg total) by mouth daily. 90 tablet 0   chlorthalidone (HYGROTON) 25 MG tablet Take 1 tablet (25 mg total) by mouth daily. (Patient taking differently: Take 37.5 mg by mouth every other day.)     cloNIDine (CATAPRES) 0.1 MG tablet Take 1 tablet (0.1 mg total) by mouth 3 (three) times daily. 90 tablet 3   dicyclomine (BENTYL) 20 MG tablet Take 1 tablet (20 mg total) by mouth 3 (three) times daily as needed for spasms (rectal/AB pain). (Patient taking differently: Take 20 mg by mouth daily.) 30 tablet 1   HYDROcodone-acetaminophen (NORCO) 10-325 MG tablet Take 1 tablet by mouth as needed.     lactulose (CEPHULAC) 10 g packet Take 10 g by mouth 3 (three) times daily.  linaclotide (LINZESS) 72 MCG capsule Take 1 capsule (72 mcg total) by mouth daily before breakfast for 8 days.     metoprolol tartrate (LOPRESSOR) 50 MG tablet Take 1 tablet (50 mg total) by mouth 2 (two) times daily. 180 tablet 0   mometasone-formoterol  (DULERA) 200-5 MCG/ACT AERO Inhale 2 puffs into the lungs 2 (two) times daily. 13 g 0   morphine (KADIAN) 10 MG 24 hr capsule Take 10 mg by mouth daily.     Oxycodone HCl 10 MG TABS Take 1 tablet (10 mg total) by mouth every 6 (six) hours as needed. 45 tablet 0   polyethylene glycol powder (MIRALAX) 17 GM/SCOOP powder Take 17 g by mouth 2 (two) times daily. 238 g 0   potassium chloride (MICRO-K) 10 MEQ CR capsule Take 2 capsules (20 mEq total) by mouth 2 (two) times daily. 120 capsule 2   umeclidinium bromide (INCRUSE ELLIPTA) 62.5 MCG/ACT AEPB Inhale 1 puff into the lungs daily. 30 each 1   No current facility-administered medications for this visit.    Allergies Allergies  Allergen Reactions   Lisinopril Swelling and Other (See Comments)    Lip swelling and Angioedema   Irinotecan Other (See Comments)    Stomach cramping Patient given 0.5 mL atropine IV and was able to continue infusion.  See Progress note on 10/03/20.    Leucovorin Calcium Itching    Patient reported itchy palms towards end of transfusion, see progress note 08/04/22.    Oxaliplatin Itching    Patient reported itchy palms towards end of transfusion, see progress note on 08/04/22.    Histories Past Medical History:  Diagnosis Date   Benign essential hypertension 10/06/2017   Last Assessment & Plan:  Formatting of this note might be different from the original. Patient's blood pressure noted to be elevated at today's visit 188/98.  Patient will be provided with antihypertensives in the treatment center if needed in order to meet her Avastin parameters.  Patient recommended to follow up with her primary care physician regarding medication management.  Patient is not cur   Cancer [facility])    Colon cancer (HCC)    Hypertension    Renal calculi    bilateral   Past Surgical History:  Procedure Laterality Date   BILIARY BRUSHING  03/24/2022   Procedure: BILIARY BRUSHING;  Surgeon: Meridee Score Netty Starring., MD;  Location: [facility]  ENDOSCOPY;  Service: Gastroenterology;;   BILIARY DILATION  03/24/2022   Procedure: BILIARY DILATION;  Surgeon: Lemar Lofty., MD;  Location: [facility] ENDOSCOPY;  Service: Gastroenterology;;   BILIARY STENT PLACEMENT  03/24/2022   Procedure: BILIARY STENT PLACEMENT;  Surgeon: Lemar Lofty., MD;  Location: [facility] ENDOSCOPY;  Service: Gastroenterology;;   BIOPSY  03/24/2022   Procedure: BIOPSY;  Surgeon: Lemar Lofty., MD;  Location: [facility] ENDOSCOPY;  Service: Gastroenterology;;   BIOPSY  11/23/2022   Procedure: BIOPSY;  Surgeon: Hilarie Fredrickson, MD;  Location: [facility] ENDOSCOPY;  Service: Gastroenterology;;   CESAREAN SECTION     x2   COLONOSCOPY N/A 11/23/2022   Procedure: COLONOSCOPY;  Surgeon: Hilarie Fredrickson, MD;  Location: [facility] ENDOSCOPY;  Service: Gastroenterology;  Laterality: N/A;   CYSTOSCOPY WITH RETROGRADE PYELOGRAM, URETEROSCOPY AND STENT PLACEMENT Right 10/22/2020   Procedure: CYSTOSCOPY WITH RETROGRADE PYELOGRAM, URETEROSCOPY AND STENT PLACEMENT;  Surgeon: Noel Christmas, MD;  Location: [facility] ORS;  Service: Urology;  Laterality: Right;   ERCP N/A 03/24/2022   Procedure: ENDOSCOPIC RETROGRADE CHOLANGIOPANCREATOGRAPHY (ERCP);  Surgeon: Lemar Lofty., MD;  Location:  [facility] ENDOSCOPY;  Service: Gastroenterology;  Laterality: N/A;   IR PATIENT EVAL TECH 0-60 MINS  12/15/2022   IR RADIOLOGIST EVAL & MGMT  09/16/2019   IR RADIOLOGIST EVAL & MGMT  09/23/2019   IR RADIOLOGIST EVAL & MGMT  09/20/2019   IR RADIOLOGIST EVAL & MGMT  09/30/2019   IR REMOVAL TUN ACCESS W/ PORT W/O FL MOD SED  09/14/2019   IR VENO/EXT/UNI RIGHT  04/11/2022   PANCREATIC STENT PLACEMENT  03/24/2022   Procedure: PANCREATIC STENT PLACEMENT;  Surgeon: Lemar Lofty., MD;  Location: [facility] ENDOSCOPY;  Service: Gastroenterology;;   REMOVAL OF STONES  03/24/2022   Procedure: REMOVAL OF STONES;  Surgeon: Lemar Lofty., MD;  Location: [facility] ENDOSCOPY;  Service: Gastroenterology;;   Dennison Mascot  03/24/2022    Procedure: Dennison Mascot;  Surgeon: Mansouraty, Netty Starring., MD;  Location: [facility] ENDOSCOPY;  Service: Gastroenterology;;   URETERAL STENT PLACEMENT     Social History   Socioeconomic History   Marital status: Single    Spouse name: Not on file   Number of children: Not on file   Years of education: Not on file   Highest education level: Not on file  Occupational History   Not on file  Tobacco Use   Smoking status: Former    Types: Cigarettes   Smokeless tobacco: Never   Tobacco comments:    Quit 1 month ago   Vaping Use   Vaping status: Never Used  Substance and Sexual Activity   Alcohol use: Not Currently   Drug use: Not Currently    Types: Hydrocodone   Sexual activity: Not Currently  Other Topics Concern   Not on file  Social History Narrative   Not on file   Social Drivers of Health   Financial Resource Strain: Low Risk  (11/11/2022)   Overall Financial Resource Strain (CARDIA)    Difficulty of Paying Living Expenses: Not hard at all  Food Insecurity: No Food Insecurity (12/24/2022)   Hunger Vital Sign    Worried About Running Out of Food in the Last Year: Never true    Ran Out of Food in the Last Year: Never true  Recent Concern: Food Insecurity - Food Insecurity Present (11/11/2022)   Hunger Vital Sign    Worried About Radiation protection practitioner of Food in the Last Year: Sometimes true    Ran Out of Food in the Last Year: Sometimes true  Transportation Needs: No Transportation Needs (12/24/2022)   PRAPARE - Administrator, Civil Service (Medical): No    Lack of Transportation (Non-Medical): No  Recent Concern: Transportation Needs - Unmet Transportation Needs (11/11/2022)   PRAPARE - Transportation    Lack of Transportation (Medical): Yes    Lack of Transportation (Non-Medical): Yes  Physical Activity: Inactive (11/11/2022)   Exercise Vital Sign    Days of Exercise per Week: 0 days    Minutes of Exercise per Session: 0 min  Stress: No Stress Concern Present  (11/11/2022)   [employer] of Occupational Health - Occupational Stress Questionnaire    Feeling of Stress : Not at all  Social Connections: Socially Isolated (11/11/2022)   Social Connection and Isolation Panel [NHANES]    Frequency of Communication with Friends and Family: More than three times a week    Frequency of Social Gatherings with Friends and Family: More than three times a week    Attends Religious Services: Never    Database administrator or Organizations: No    Attends Club or  Organization Meetings: Never    Marital Status: Never married  Intimate Partner Violence: Not At Risk (12/24/2022)   Humiliation, Afraid, Rape, and Kick questionnaire    Fear of Current or Ex-Partner: No    Emotionally Abused: No    Physically Abused: No    Sexually Abused: No   Family History  Problem Relation Age of Onset   Hypertension Mother    Diabetes Mellitus II Father    Stroke Father    Ulcers Sister    Colon cancer Neg Hx    Esophageal cancer Neg Hx    Inflammatory bowel disease Neg Hx    Liver disease Neg Hx    Pancreatic cancer Neg Hx    Rectal cancer Neg Hx    Stomach cancer Neg Hx    I have reviewed her medical, social, and family history in detail and updated the electronic medical record as necessary.    PHYSICAL EXAMINATION  BP 102/70 (BP Location: Right Arm, Patient Position: Sitting, Cuff Size: Normal)   Pulse (!) 112   Ht 5\' 4"  (1.626 m)   Wt 136 lb 2 oz (61.7 kg)   LMP 01/07/2006   BMI 23.37 kg/m  Wt Readings from Last 3 Encounters:  12/31/22 136 lb 2 oz (61.7 kg)  12/30/22 138 lb (62.6 kg)  12/24/22 141 lb 11.2 oz (64.3 kg)  GEN: NAD, appears stated age, doesn't appear chronically ill, accompanied by family member PSYCH: Cooperative, without pressured speech EYE: Conjunctivae pink, sclerae anicteric ENT: MMM CV: Nontachycardic RESP: No audible wheezing GI: NABS, soft, TTP in MEG (she says at baseline), without rebound or guarding MSK/EXT: No  significant lower extremity edema SKIN: No jaundice NEURO:  Alert & Oriented x 3, no focal deficits   REVIEW OF DATA  I reviewed the following data at the time of this encounter:  GI Procedures and Studies  Previously reviewed  Laboratory Studies  Reviewed those in [facility]  Imaging Studies  December 24, 2022 CT abdomen pelvis IMPRESSION: 1. Increasingly thickened folds in the transverse colon, more likely to be infectious or inflammatory than neoplastic. 2. Gastroenteritis. 3. 2.1 cm heterogeneous mass in the lateral aspect of left hepatic segment 2, most likely a metastasis and not seen on 11/19/2022. 4. Potentially new 5 mm hypodensity in segment 5 which could be a small metastasis. 5. Stable biliary stents with intrahepatic biliary dilatation, greater in the left lobe, similar to the last CT but increased since 11/19/2022. 6. Central biliary wall thickening near the hepatic hilum which could be related to cholangitis or infiltrating disease. 7. Multiple peritoneal/omental implants, not notably changed since 9 days ago but slightly larger than on 11/19/2022. 8. Small volume of pelvic ascites. 9. Constipation. 10. Aortic atherosclerosis. 11. Bilateral nephrolithiasis. 12. 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   ASSESSMENT  Ms. Trevathan is a 47 y.o. female with a pmh significant for hypertension, nephrolithiasis, metastatic colon cancer (complicated by biliary obstruction status post ERCP with stenting), chronic pain, constipation.  The patient is seen today for evaluation and management of:  1. Intrahepatic bile duct dilation   2. History of biliary stent insertion   3. History of ERCP   4. Metastatic colon cancer to liver (HCC)   5. Chronic constipation   6. Bacteremia due to Klebsiella pneumoniae   7. Imaging of gastrointestinal tract abnormal    The patient is hemodynamically and  clinically stable at this time.  Her  metastatic colon cancer to the liver has had progression of her disease even on current chemotherapy and there is plan for clinical trial at [facility]  Her presentation to the hospital more recently was more consistent with an enterocolitis though imaging still has some concern about the potential of biliary obstruction.  Certainly her stents have been in for a while, so there is a risk of biliary obstruction from stent sludge development.  Based on my availability we have her scheduled for a January procedure.  If she starts developing fevers or chills that she comes off the antibiotic therapy and if she were to develop recurrent bacteremia, then she may need an earlier repeat ERCP.  I wanted to recheck your liver tests and a CBC today, but patient is wary about being stuck and not having it drawn per report, so we will place the labs in a pended fashion so that she can have them done for her next chemotherapy in the next week and a half.  I have asked the patient to continue her MiraLAX therapy as well as adding a stool softener with continued as needed use of lactulose.  I have given her a sample of Linzess 72 mcg to see if that will be helpful and if it is, then we can send in a prescription or try other laxative therapies as needed.  The risks and benefits of endoscopic evaluation were discussed with the patient; these include but are not limited to the risk of perforation, infection, bleeding, missed lesions, lack of diagnosis, severe illness requiring hospitalization, as well as anesthesia and sedation related illnesses.  The patient and/or family is agreeable to proceed.  All patient questions were answered to the best of my ability, and the patient agrees to the aforementioned plan of action with follow-up as indicated.   PLAN  Linzess samples 72 mcg daily (if effective we can send in a prescription) - May use MiraLAX/stool softener +/- lactulose if she wants to  continue that regimen ERCP for stent exchange in January (unclear if we will do SEMS versus replacing plastic stents bilaterally) follow-up Laboratories as outlined below to be drawn at next clinic visit with oncology If patient develops recurrent fevers or bacteremia, then earlier ERCP may be required in the hospital-based setting   Orders Placed This Encounter  Procedures   CBC   Comp Met (CMET)   Gamma GT    New Prescriptions   LINACLOTIDE (LINZESS) 72 MCG CAPSULE    Take 1 capsule (72 mcg total) by mouth daily before breakfast for 8 days.   Modified Medications   No medications on file    Planned Follow Up No follow-ups on file.   Total Time in Face-to-Face and in Coordination of Care for patient including independent/personal interpretation/review of prior testing, medical history, examination, medication adjustment, communicating results with the patient directly, and documentation within the EHR is 25 minutes.   Corliss Parish, MD [facility] Office # [phone]

## 2022-12-31 NOTE — Assessment & Plan Note (Addendum)
Active colon cancer with significant pain, rated 10/10 without medication and reduced to 4/10 with medication. Pain management is crucial for quality of life and may impact blood pressure control. Treatments to continue weakly indefinitely at this time due to uncontrolled pain when stopped.  - Continue current pain management regimen as prescribed by Dr. Truett Perna - Monitor pain levels and adjust pain management as needed

## 2022-12-31 NOTE — Assessment & Plan Note (Signed)
Experiencing neuropathy in the feet, likely due to chemotherapy. Reports lack of sensation, which has not yet resolved. Oncologist indicated that sensation may return over time. - Continue monitoring neuropathy symptoms - Discuss with oncologist if symptoms persist or worsen

## 2022-12-31 NOTE — Patient Instructions (Addendum)
Continue Miralax daily. If Miralax seems ineffective then can start Linzess 72 mcg daily. If the Linzess samples are helping then MyChart message our office so we can send in a prescription.   We have scheduled and instructed you on your ERCP.   Please have these labs in our system drawn at your oncologist office.   _______________________________________________________  If your blood pressure at your visit was 140/90 or greater, please contact your primary care physician to follow up on this.  _______________________________________________________  If you are age 8 or older, your body mass index should be between 23-30. Your Body mass index is 23.37 kg/m. If this is out of the aforementioned range listed, please consider follow up with your Primary Care Provider.  If you are age 88 or younger, your body mass index should be between 19-25. Your Body mass index is 23.37 kg/m. If this is out of the aformentioned range listed, please consider follow up with your Primary Care Provider.   ________________________________________________________  The [facility] providers would like to encourage you to use [facility] to communicate with providers for non-urgent requests or questions.  Due to long hold times on the telephone, sending your provider a message by [facility] may be a faster and more efficient way to get a response.  Please allow 48 business hours for a response.  Please remember that this is for non-urgent requests.  _______________________________________________________

## 2022-12-31 NOTE — Telephone Encounter (Signed)
ERCP scheduled, pt instructed and medications reviewed.  Patient instructions mailed to home.  Patient to call with any questions or concerns.  

## 2023-01-01 ENCOUNTER — Inpatient Hospital Stay: Payer: 59

## 2023-01-01 VITALS — BP 110/90 | HR 108 | Temp 98.1°F | Resp 17

## 2023-01-01 DIAGNOSIS — C20 Malignant neoplasm of rectum: Secondary | ICD-10-CM

## 2023-01-01 DIAGNOSIS — Z95828 Presence of other vascular implants and grafts: Secondary | ICD-10-CM

## 2023-01-01 DIAGNOSIS — Z5111 Encounter for antineoplastic chemotherapy: Secondary | ICD-10-CM | POA: Diagnosis not present

## 2023-01-01 MED ORDER — HEPARIN SOD (PORK) LOCK FLUSH 100 UNIT/ML IV SOLN
500.0000 [IU] | Freq: Once | INTRAVENOUS | Status: AC
  Administered 2023-01-01: 500 [IU]

## 2023-01-01 MED ORDER — SODIUM CHLORIDE 0.9% FLUSH
10.0000 mL | Freq: Once | INTRAVENOUS | Status: AC
  Administered 2023-01-01: 10 mL

## 2023-01-01 NOTE — Patient Instructions (Signed)
PICC Home Care Guide A peripherally inserted central catheter (PICC) is a form of IV access that allows medicines and IV fluids to be quickly put into the blood and spread throughout the body. The PICC is a long, thin, flexible tube (catheter) that is put into a vein in a person's arm or leg. The catheter ends in a large vein just outside the heart called the superior vena cava (SVC). After the PICC is put in, a chest X-ray may be done to make sure that it is in the right place. A PICC may be placed for different reasons, such as: To give medicines and liquid nutrition. To give IV fluids and blood products. To take blood samples often. If there is trouble placing a peripheral intravenous (PIV) catheter. If cared for properly, a PICC can remain in place for many months. Having a PICC can allow you to go home from the hospital sooner and continue treatment at home. Medicines and PICC care can be managed at home by a family member, caregiver, or home health care team. What are the risks? Generally, having a PICC is safe. However, problems may occur, including: A blood clot (thrombus) forming in or at the end of the PICC. A blood clot forming in a vein (deep vein thrombosis) or traveling to the lung (pulmonary embolism). Inflammation of the vein (phlebitis) in which the PICC is placed. Infection at the insertion site or in the blood. Blood infections from central lines, like PICCs, can be serious and often require a hospital stay. PICC malposition, or PICC movement or poor placement. A break or cut in the PICC. Do not use scissors near the PICC. Nerve or tendon irritation or injury during PICC insertion. How to care for your PICC Please follow the specific guidelines provided by your health care provider. Preventing infection You and any caregivers should wash your hands often with soap and water for at least 20 seconds. Wash hands: Before touching the PICC or the infusion device. Before changing a  bandage (dressing). Do not change the dressing unless you have been taught to do so and have shown you are able to change it safely. Flush the PICC as told. Tell your health care provider right away if the PICC is hard to flush or does not flush. Do not use force to flush the PICC. Use clean and germ-free (sterile) supplies only. Keep the supplies in a dry place. Do not reuse needles, syringes, or any other supplies. Reusing supplies can lead to infection. Keep the PICC dressing dry and secure it with tape if the edges stop sticking to your skin. Check your PICC insertion site every day for signs of infection. Check for: Redness, swelling, or pain. Fluid or blood. Warmth. Pus or a bad smell. Preventing other problems Do not use a syringe that is less than 10 mL to flush the PICC. Do not have your blood pressure checked on the arm in which the PICC is placed. Do not ever pull or tug on the PICC. Keep it secured to your arm with tape or a stretch wrap when not in use. Do not take the PICC out yourself. Only a trained health care provider should remove the PICC. Keep pets and children away from your PICC. How to care for your PICC dressing Keep your PICC dressing clean and dry to prevent infection. Do not take baths, swim, or use a hot tub until your health care provider approves. Ask your health care provider if you can take  showers. You may only be allowed to take sponge baths. When you are allowed to shower: Ask your health care provider to teach you how to wrap the PICC. Cover the PICC with clear plastic wrap and tape to keep it dry while showering. Follow instructions from your health care provider about how to take care of your insertion site and dressing. Make sure you: Wash your hands with soap and water for at least 20 seconds before and after you change your dressing. If soap and water are not available, use hand sanitizer. Change your dressing only if taught to do so by your health care  provider. Your PICC dressing needs to be changed if it becomes loose or wet. Leave stitches (sutures), skin glue, or adhesive strips in place. These skin closures may need to stay in place for 2 weeks or longer. If adhesive strip edges start to loosen and curl up, you may trim the loose edges. Do not remove adhesive strips completely unless your health care provider tells you to do that. Follow these instructions at home: Disposal of supplies Throw away any syringes in a disposal container that is meant for sharp items (sharps container). You can buy a sharps container from a pharmacy, or you can make one by using an empty, hard plastic bottle with a lid. Place any used dressings or infusion bags into a plastic bag. Throw that bag in the trash. General instructions  Always carry your PICC identification card or wear a medical alert bracelet. Keep the tube clamped at all times, unless it is being used. Always carry a smooth-edge clamp with you to clamp the PICC if it breaks. Do not use scissors or sharp objects near the tube. You may bend your arm and move it freely. If your PICC is near or at the bend of your elbow, avoid activity with repeated motion at the elbow. Avoid lifting heavy objects as told by your health care provider. Keep all follow-up visits. This is important. You will need to have your PICC dressing changed at least once a week. Contact a health care provider if: You have pain in your arm, ear, face, or teeth. You have a fever or chills. You have redness, swelling, or pain around the insertion site. You have fluid or blood coming from the insertion site. Your insertion site feels warm to the touch. You have pus or a bad smell coming from the insertion site. Your skin feels hard and raised around the insertion site. Your PICC dressing has gotten wet or is coming off and you have not been taught how to change it. Get help right away if: You have problems with your PICC, such as  your PICC: Was tugged or pulled and has partially come out. Do not  push the PICC back in. Cannot be flushed, is hard to flush, or leaks around the insertion site when it is flushed. Makes a flushing sound when it is flushed. Appears to have a hole or tear. Is accidentally pulled all the way out. If this happens, cover the insertion site with a gauze dressing. Do not throw the PICC away. Your health care provider will need to check it to be sure the entire catheter came out. You feel your heart racing or skipping beats, or you have chest pain. You have shortness of breath or trouble breathing. You have swelling, redness, warmth, or pain in the arm in which the PICC is placed. You have a red streak going up your arm that  starts under the PICC dressing. These symptoms may be an emergency. Get help right away. Call 911. Do not wait to see if the symptoms will go away. Do not drive yourself to the hospital. Summary A peripherally inserted central catheter (PICC) is a long, thin, flexible tube (catheter) that is put into a vein in the arm or leg. If cared for properly, a PICC can remain in place for many months. Having a PICC can allow you to go home from the hospital sooner and continue treatment at home. The PICC is inserted using a germ-free (sterile) technique by a specially trained health care provider. Only a trained health care provider should remove it. Do not have your blood pressure checked on the arm in which your PICC is placed. Always keep your PICC identification card with you. This information is not intended to replace advice given to you by your health care provider. Make sure you discuss any questions you have with your health care provider. Document Revised: 07/18/2020 Document Reviewed: 07/18/2020 Elsevier Patient Education  2024 [employer].

## 2023-01-06 ENCOUNTER — Other Ambulatory Visit (HOSPITAL_BASED_OUTPATIENT_CLINIC_OR_DEPARTMENT_OTHER): Payer: Self-pay

## 2023-01-06 ENCOUNTER — Inpatient Hospital Stay: Payer: 59

## 2023-01-06 ENCOUNTER — Inpatient Hospital Stay (HOSPITAL_BASED_OUTPATIENT_CLINIC_OR_DEPARTMENT_OTHER): Payer: 59 | Admitting: Oncology

## 2023-01-06 VITALS — BP 109/88 | HR 97 | Temp 98.2°F | Resp 15 | Ht 64.0 in | Wt 134.7 lb

## 2023-01-06 DIAGNOSIS — C19 Malignant neoplasm of rectosigmoid junction: Secondary | ICD-10-CM

## 2023-01-06 DIAGNOSIS — E876 Hypokalemia: Secondary | ICD-10-CM

## 2023-01-06 DIAGNOSIS — Z5111 Encounter for antineoplastic chemotherapy: Secondary | ICD-10-CM | POA: Diagnosis not present

## 2023-01-06 LAB — CMP (CANCER CENTER ONLY)
ALT: 63 U/L — ABNORMAL HIGH (ref 0–44)
AST: 61 U/L — ABNORMAL HIGH (ref 15–41)
Albumin: 4.1 g/dL (ref 3.5–5.0)
Alkaline Phosphatase: 586 U/L — ABNORMAL HIGH (ref 38–126)
Anion gap: 10 (ref 5–15)
BUN: 13 mg/dL (ref 6–20)
CO2: 33 mmol/L — ABNORMAL HIGH (ref 22–32)
Calcium: 9.8 mg/dL (ref 8.9–10.3)
Chloride: 94 mmol/L — ABNORMAL LOW (ref 98–111)
Creatinine: 0.6 mg/dL (ref 0.44–1.00)
GFR, Estimated: >60 mL/min (ref >60)
Glucose, Bld: 106 mg/dL — ABNORMAL HIGH (ref 70–99)
Potassium: 3.1 mmol/L — ABNORMAL LOW (ref 3.5–5.1)
Sodium: 137 mmol/L (ref 135–145)
Total Bilirubin: 0.4 mg/dL (ref <1.2)
Total Protein: 8 g/dL (ref 6.5–8.1)

## 2023-01-06 LAB — CBC WITH DIFFERENTIAL (CANCER CENTER ONLY)
Abs Immature Granulocytes: 0.01 10*3/uL (ref 0.00–0.07)
Basophils Absolute: 0 10*3/uL (ref 0.0–0.1)
Basophils Relative: 1 %
Eosinophils Absolute: 0.8 10*3/uL — ABNORMAL HIGH (ref 0.0–0.5)
Eosinophils Relative: 11 %
HCT: 35.5 % — ABNORMAL LOW (ref 36.0–46.0)
Hemoglobin: 11.7 g/dL — ABNORMAL LOW (ref 12.0–15.0)
Immature Granulocytes: 0 %
Lymphocytes Relative: 37 %
Lymphs Abs: 2.6 10*3/uL (ref 0.7–4.0)
MCH: 29.9 pg (ref 26.0–34.0)
MCHC: 33 g/dL (ref 30.0–36.0)
MCV: 90.8 fL (ref 80.0–100.0)
Monocytes Absolute: 0.6 10*3/uL (ref 0.1–1.0)
Monocytes Relative: 8 %
Neutro Abs: 3.1 10*3/uL (ref 1.7–7.7)
Neutrophils Relative %: 43 %
Platelet Count: 271 10*3/uL (ref 150–400)
RBC: 3.91 MIL/uL (ref 3.87–5.11)
RDW: 13.9 % (ref 11.5–15.5)
WBC Count: 7 10*3/uL (ref 4.0–10.5)
nRBC: 0 % (ref 0.0–0.2)

## 2023-01-06 LAB — MAGNESIUM: Magnesium: 1.9 mg/dL (ref 1.7–2.4)

## 2023-01-06 MED ORDER — POTASSIUM CHLORIDE 10 MEQ/100ML IV SOLN
10.0000 meq | INTRAVENOUS | Status: AC
  Administered 2023-01-06 (×2): 10 meq via INTRAVENOUS
  Filled 2023-01-06: qty 100

## 2023-01-06 MED ORDER — HYDROCODONE-ACETAMINOPHEN 10-325 MG PO TABS
1.0000 | ORAL_TABLET | ORAL | 0 refills | Status: DC | PRN
  Filled 2023-01-06: qty 60, 10d supply, fill #0

## 2023-01-06 MED ORDER — IRINOTECAN HCL CHEMO INJECTION 100 MG/5ML
125.0000 mg/m2 | Freq: Once | INTRAVENOUS | Status: AC
  Administered 2023-01-06: 200 mg via INTRAVENOUS
  Filled 2023-01-06: qty 10

## 2023-01-06 MED ORDER — ATROPINE SULFATE 1 MG/ML IV SOLN
0.5000 mg | Freq: Once | INTRAVENOUS | Status: AC | PRN
  Administered 2023-01-06: 0.5 mg via INTRAVENOUS
  Filled 2023-01-06: qty 1

## 2023-01-06 MED ORDER — HEPARIN SOD (PORK) LOCK FLUSH 100 UNIT/ML IV SOLN
250.0000 [IU] | Freq: Once | INTRAVENOUS | Status: AC | PRN
  Administered 2023-01-06: 250 [IU]

## 2023-01-06 MED ORDER — SODIUM CHLORIDE 0.9 % IV SOLN
6.0000 mg/kg | Freq: Once | INTRAVENOUS | Status: AC
  Administered 2023-01-06: 400 mg via INTRAVENOUS
  Filled 2023-01-06: qty 20

## 2023-01-06 MED ORDER — DEXAMETHASONE SODIUM PHOSPHATE 10 MG/ML IJ SOLN
10.0000 mg | Freq: Once | INTRAMUSCULAR | Status: AC
  Administered 2023-01-06: 10 mg via INTRAVENOUS
  Filled 2023-01-06: qty 1

## 2023-01-06 MED ORDER — SODIUM CHLORIDE 0.9 % IV SOLN
INTRAVENOUS | Status: DC
Start: 2023-01-06 — End: 2023-01-06

## 2023-01-06 MED ORDER — PALONOSETRON HCL INJECTION 0.25 MG/5ML
0.2500 mg | Freq: Once | INTRAVENOUS | Status: AC
  Administered 2023-01-06: 0.25 mg via INTRAVENOUS
  Filled 2023-01-06: qty 5

## 2023-01-06 MED ORDER — SODIUM CHLORIDE 0.9% FLUSH
10.0000 mL | INTRAVENOUS | Status: DC | PRN
  Administered 2023-01-06: 10 mL

## 2023-01-06 MED ORDER — DICYCLOMINE HCL 20 MG PO TABS
20.0000 mg | ORAL_TABLET | Freq: Three times a day (TID) | ORAL | 1 refills | Status: DC | PRN
  Filled 2023-01-06: qty 60, 20d supply, fill #0

## 2023-01-06 NOTE — Patient Instructions (Signed)
[facility]  Discharge Instructions: Thank you for choosing [facility] to provide your oncology and hematology care.   If you have a lab appointment with the Cancer Center, please go directly to the Cancer Center and check in at the registration area.   Wear comfortable clothing and clothing appropriate for easy access to any Portacath or PICC line.   We strive to give you quality time with your provider. You may need to reschedule your appointment if you arrive late (15 or more minutes).  Arriving late affects you and other patients whose appointments are after yours.  Also, if you miss three or more appointments without notifying the office, you may be dismissed from the clinic at the provider's discretion.      For prescription refill requests, have your pharmacy contact our office and allow 72 hours for refills to be completed.    Today you received the following chemotherapy and/or immunotherapy agents: panitumumab, irinotecan      To help prevent nausea and vomiting after your treatment, we encourage you to take your nausea medication as directed.  BELOW ARE SYMPTOMS THAT SHOULD BE REPORTED IMMEDIATELY: *FEVER GREATER THAN 100.4 F (38 C) OR HIGHER *CHILLS OR SWEATING *NAUSEA AND VOMITING THAT IS NOT CONTROLLED WITH YOUR NAUSEA MEDICATION *UNUSUAL SHORTNESS OF BREATH *UNUSUAL BRUISING OR BLEEDING *URINARY PROBLEMS (pain or burning when urinating, or frequent urination) *BOWEL PROBLEMS (unusual diarrhea, constipation, pain near the anus) TENDERNESS IN MOUTH AND THROAT WITH OR WITHOUT PRESENCE OF ULCERS (sore throat, sores in mouth, or a toothache) UNUSUAL RASH, SWELLING OR PAIN  UNUSUAL VAGINAL DISCHARGE OR ITCHING   Items with * indicate a potential emergency and should be followed up as soon as possible or go to the Emergency Department if any problems should occur.  Please show the CHEMOTHERAPY ALERT CARD or  IMMUNOTHERAPY ALERT CARD at check-in to the Emergency Department and triage nurse.  Should you have questions after your visit or need to cancel or reschedule your appointment, please contact [facility]  Dept: [phone]  and follow the prompts.  Office hours are 8:00 a.m. to 4:30 p.m. Monday - Friday. Please note that voicemails left after 4:00 p.m. may not be returned until the following business day.  We are closed weekends and major holidays. You have access to a nurse at all times for urgent questions. Please call the main number to the clinic Dept: [phone] and follow the prompts.   For any non-urgent questions, you may also contact your provider using MyChart. We now offer e-Visits for anyone 32 and older to request care online for non-urgent symptoms. For details visit [email or web address].   Also download the MyChart app! Go to the app store, search "MyChart", open the app, select [facility], and log in with your MyChart username and password.  Panitumumab Injection What is this medication? PANITUMUMAB (pan i TOOM ue mab) treats colorectal cancer. It works by blocking a protein that causes cancer cells to grow and multiply. This helps to slow or stop the spread of cancer cells. It is a monoclonal antibody. This medicine may be used for other purposes; ask your health care provider or pharmacist if you have questions. COMMON BRAND NAME(S): Vectibix What should I tell my care team before I take this medication? They need to know if you have any of these conditions: Eye disease Low levels  of magnesium in the blood Lung disease An unusual or allergic reaction to panitumumab, other medications, foods, dyes, or preservatives Pregnant or trying to get pregnant Breast-feeding How should I use this medication? This medication is injected into a vein. It is given by your care team in a hospital or clinic setting. Talk to your  care team about the use of this medication in children. Special care may be needed. Overdosage: If you think you have taken too much of this medicine contact a poison control center or emergency room at once. NOTE: This medicine is only for you. Do not share this medicine with others. What if I miss a dose? Keep appointments for follow-up doses. It is important not to miss your dose. Call your care team if you are unable to keep an appointment. What may interact with this medication? Bevacizumab This list may not describe all possible interactions. Give your health care provider a list of all the medicines, herbs, non-prescription drugs, or dietary supplements you use. Also tell them if you smoke, drink alcohol, or use illegal drugs. Some items may interact with your medicine. What should I watch for while using this medication? Your condition will be monitored carefully while you are receiving this medication. This medication may make you feel generally unwell. This is not uncommon as chemotherapy can affect healthy cells as well as cancer cells. Report any side effects. Continue your course of treatment even though you feel ill unless your care team tells you to stop. This medication can make you more sensitive to the sun. Keep out of the sun while receiving this medication and for 2 months after stopping therapy. If you cannot avoid being in the sun, wear protective clothing and sunscreen. Do not use sun lamps, tanning beds, or tanning booths. Check with your care team if you have severe diarrhea, nausea, and vomiting or if you sweat a lot. The loss of too much body fluid may make it dangerous for you to take this medication. This medication may cause serious skin reactions. They can happen weeks to months after starting the medication. Contact your care team right away if you notice fevers or flu-like symptoms with a rash. The rash may be red or purple and then turn into blisters or peeling of the  skin. You may also notice a red rash with swelling of the face, lips, or lymph nodes in your neck or under your arms. Talk to your care team if you may be pregnant. Serious birth defects can occur if you take this medication during pregnancy and for 2 months after the last dose. Contraception is recommended while taking this medication and for 2 months after the last dose. Your care team can help you find the option that works for you. Do not breastfeed while taking this medication and for 2 months after the last dose. This medication may cause infertility. Talk to your care team if you are concerned about your fertility. What side effects may I notice from receiving this medication? Side effects that you should report to your care team as soon as possible: Allergic reactions--skin rash, itching, hives, swelling of the face, lips, tongue, or throat Dry cough, shortness of breath or trouble breathing Eye pain, redness, irritation, or discharge with blurry or decreased vision Infusion reactions--chest pain, shortness of breath or trouble breathing, feeling faint or lightheaded Low magnesium level--muscle pain or cramps, unusual weakness or fatigue, fast or irregular heartbeat, tremors Low potassium level--muscle pain or cramps, unusual weakness or  fatigue, fast or irregular heartbeat, constipation Redness, blistering, peeling, or loosening of the skin, including inside the mouth Skin reactions on sun-exposed areas Side effects that usually do not require medical attention (report to your care team if they continue or are bothersome): Change in nail shape, thickness, or color Diarrhea Dry skin Fatigue Nausea Vomiting This list may not describe all possible side effects. Call your doctor for medical advice about side effects. You may report side effects to FDA at 1-800-FDA-1088. Where should I keep my medication? This medication is given in a hospital or clinic. It will not be stored at  home. NOTE: This sheet is a summary. It may not cover all possible information. If you have questions about this medicine, talk to your doctor, pharmacist, or health care provider.  2024 Elsevier/Gold Standard (2021-05-15 00:00:00) Irinotecan Injection What is this medication? IRINOTECAN (ir in oh TEE kan) treats some types of cancer. It works by slowing down the growth of cancer cells. This medicine may be used for other purposes; ask your health care provider or pharmacist if you have questions. COMMON BRAND NAME(S): Camptosar What should I tell my care team before I take this medication? They need to know if you have any of these conditions: Dehydration Diarrhea Infection, especially a viral infection, such as chickenpox, cold sores, herpes Liver disease Low blood cell levels (white cells, red cells, and platelets) Low levels of electrolytes, such as calcium, magnesium, or potassium in your blood Recent or ongoing radiation An unusual or allergic reaction to irinotecan, other medications, foods, dyes, or preservatives If you or your partner are pregnant or trying to get pregnant Breast-feeding How should I use this medication? This medication is injected into a vein. It is given by your care team in a hospital or clinic setting. Talk to your care team about the use of this medication in children. Special care may be needed. Overdosage: If you think you have taken too much of this medicine contact a poison control center or emergency room at once. NOTE: This medicine is only for you. Do not share this medicine with others. What if I miss a dose? Keep appointments for follow-up doses. It is important not to miss your dose. Call your care team if you are unable to keep an appointment. What may interact with this medication? Do not take this medication with any of the following: Cobicistat Itraconazole This medication may also interact with the following: Certain antibiotics, such as  clarithromycin, rifampin, rifabutin Certain antivirals for HIV or AIDS Certain medications for fungal infections, such as ketoconazole, posaconazole, voriconazole Certain medications for seizures, such as carbamazepine, phenobarbital, phenytoin Gemfibrozil Nefazodone St. John's wort This list may not describe all possible interactions. Give your health care provider a list of all the medicines, herbs, non-prescription drugs, or dietary supplements you use. Also tell them if you smoke, drink alcohol, or use illegal drugs. Some items may interact with your medicine. What should I watch for while using this medication? Your condition will be monitored carefully while you are receiving this medication. You may need blood work while taking this medication. This medication may make you feel generally unwell. This is not uncommon as chemotherapy can affect healthy cells as well as cancer cells. Report any side effects. Continue your course of treatment even though you feel ill unless your care team tells you to stop. This medication can cause serious side effects. To reduce the risk, your care team may give you other medications to take before  receiving this one. Be sure to follow the directions from your care team. This medication may affect your coordination, reaction time, or judgement. Do not drive or operate machinery until you know how this medication affects you. Sit up or stand slowly to reduce the risk of dizzy or fainting spells. Drinking alcohol with this medication can increase the risk of these side effects. This medication may increase your risk of getting an infection. Call your care team for advice if you get a fever, chills, sore throat, or other symptoms of a cold or flu. Do not treat yourself. Try to avoid being around people who are sick. Avoid taking medications that contain aspirin, acetaminophen, ibuprofen, naproxen, or ketoprofen unless instructed by your care team. These medications  may hide a fever. This medication may increase your risk to bruise or bleed. Call your care team if you notice any unusual bleeding. Be careful brushing or flossing your teeth or using a toothpick because you may get an infection or bleed more easily. If you have any dental work done, tell your dentist you are receiving this medication. Talk to your care team if you or your partner are pregnant or think either of you might be pregnant. This medication can cause serious birth defects if taken during pregnancy and for 6 months after the last dose. You will need a negative pregnancy test before starting this medication. Contraception is recommended while taking this medication and for 6 months after the last dose. Your care team can help you find the option that works for you. Do not father a child while taking this medication and for 3 months after the last dose. Use a condom for contraception during this time period. Do not breastfeed while taking this medication and for 7 days after the last dose. This medication may cause infertility. Talk to your care team if you are concerned about your fertility. What side effects may I notice from receiving this medication? Side effects that you should report to your care team as soon as possible: Allergic reactions--skin rash, itching, hives, swelling of the face, lips, tongue, or throat Dry cough, shortness of breath or trouble breathing Increased saliva or tears, increased sweating, stomach cramping, diarrhea, small pupils, unusual weakness or fatigue, slow heartbeat Infection--fever, chills, cough, sore throat, wounds that don't heal, pain or trouble when passing urine, general feeling of discomfort or being unwell Kidney injury--decrease in the amount of urine, swelling of the ankles, hands, or feet Low red blood cell level--unusual weakness or fatigue, dizziness, headache, trouble breathing Severe or prolonged diarrhea Unusual bruising or bleeding Side  effects that usually do not require medical attention (report to your care team if they continue or are bothersome): Constipation Diarrhea Hair loss Loss of appetite Nausea Stomach pain This list may not describe all possible side effects. Call your doctor for medical advice about side effects. You may report side effects to FDA at 1-800-FDA-1088. Where should I keep my medication? This medication is given in a hospital or clinic. It will not be stored at home. NOTE: This sheet is a summary. It may not cover all possible information. If you have questions about this medicine, talk to your doctor, pharmacist, or health care provider.  2024 Elsevier/Gold Standard (2021-05-13 00:00:00)

## 2023-01-06 NOTE — Patient Instructions (Signed)
PICC Home Care Guide A peripherally inserted central catheter (PICC) is a form of IV access that allows medicines and IV fluids to be quickly put into the blood and spread throughout the body. The PICC is a long, thin, flexible tube (catheter) that is put into a vein in a person's arm or leg. The catheter ends in a large vein just outside the heart called the superior vena cava (SVC). After the PICC is put in, a chest X-ray may be done to make sure that it is in the right place. A PICC may be placed for different reasons, such as: To give medicines and liquid nutrition. To give IV fluids and blood products. To take blood samples often. If there is trouble placing a peripheral intravenous (PIV) catheter. If cared for properly, a PICC can remain in place for many months. Having a PICC can allow you to go home from the hospital sooner and continue treatment at home. Medicines and PICC care can be managed at home by a family member, caregiver, or home health care team. What are the risks? Generally, having a PICC is safe. However, problems may occur, including: A blood clot (thrombus) forming in or at the end of the PICC. A blood clot forming in a vein (deep vein thrombosis) or traveling to the lung (pulmonary embolism). Inflammation of the vein (phlebitis) in which the PICC is placed. Infection at the insertion site or in the blood. Blood infections from central lines, like PICCs, can be serious and often require a hospital stay. PICC malposition, or PICC movement or poor placement. A break or cut in the PICC. Do not use scissors near the PICC. Nerve or tendon irritation or injury during PICC insertion. How to care for your PICC Please follow the specific guidelines provided by your health care provider. Preventing infection You and any caregivers should wash your hands often with soap and water for at least 20 seconds. Wash hands: Before touching the PICC or the infusion device. Before changing a  bandage (dressing). Do not change the dressing unless you have been taught to do so and have shown you are able to change it safely. Flush the PICC as told. Tell your health care provider right away if the PICC is hard to flush or does not flush. Do not use force to flush the PICC. Use clean and germ-free (sterile) supplies only. Keep the supplies in a dry place. Do not reuse needles, syringes, or any other supplies. Reusing supplies can lead to infection. Keep the PICC dressing dry and secure it with tape if the edges stop sticking to your skin. Check your PICC insertion site every day for signs of infection. Check for: Redness, swelling, or pain. Fluid or blood. Warmth. Pus or a bad smell. Preventing other problems Do not use a syringe that is less than 10 mL to flush the PICC. Do not have your blood pressure checked on the arm in which the PICC is placed. Do not ever pull or tug on the PICC. Keep it secured to your arm with tape or a stretch wrap when not in use. Do not take the PICC out yourself. Only a trained health care provider should remove the PICC. Keep pets and children away from your PICC. How to care for your PICC dressing Keep your PICC dressing clean and dry to prevent infection. Do not take baths, swim, or use a hot tub until your health care provider approves. Ask your health care provider if you can take  showers. You may only be allowed to take sponge baths. When you are allowed to shower: Ask your health care provider to teach you how to wrap the PICC. Cover the PICC with clear plastic wrap and tape to keep it dry while showering. Follow instructions from your health care provider about how to take care of your insertion site and dressing. Make sure you: Wash your hands with soap and water for at least 20 seconds before and after you change your dressing. If soap and water are not available, use hand sanitizer. Change your dressing only if taught to do so by your health care  provider. Your PICC dressing needs to be changed if it becomes loose or wet. Leave stitches (sutures), skin glue, or adhesive strips in place. These skin closures may need to stay in place for 2 weeks or longer. If adhesive strip edges start to loosen and curl up, you may trim the loose edges. Do not remove adhesive strips completely unless your health care provider tells you to do that. Follow these instructions at home: Disposal of supplies Throw away any syringes in a disposal container that is meant for sharp items (sharps container). You can buy a sharps container from a pharmacy, or you can make one by using an empty, hard plastic bottle with a lid. Place any used dressings or infusion bags into a plastic bag. Throw that bag in the trash. General instructions  Always carry your PICC identification card or wear a medical alert bracelet. Keep the tube clamped at all times, unless it is being used. Always carry a smooth-edge clamp with you to clamp the PICC if it breaks. Do not use scissors or sharp objects near the tube. You may bend your arm and move it freely. If your PICC is near or at the bend of your elbow, avoid activity with repeated motion at the elbow. Avoid lifting heavy objects as told by your health care provider. Keep all follow-up visits. This is important. You will need to have your PICC dressing changed at least once a week. Contact a health care provider if: You have pain in your arm, ear, face, or teeth. You have a fever or chills. You have redness, swelling, or pain around the insertion site. You have fluid or blood coming from the insertion site. Your insertion site feels warm to the touch. You have pus or a bad smell coming from the insertion site. Your skin feels hard and raised around the insertion site. Your PICC dressing has gotten wet or is coming off and you have not been taught how to change it. Get help right away if: You have problems with your PICC, such as  your PICC: Was tugged or pulled and has partially come out. Do not  push the PICC back in. Cannot be flushed, is hard to flush, or leaks around the insertion site when it is flushed. Makes a flushing sound when it is flushed. Appears to have a hole or tear. Is accidentally pulled all the way out. If this happens, cover the insertion site with a gauze dressing. Do not throw the PICC away. Your health care provider will need to check it to be sure the entire catheter came out. You feel your heart racing or skipping beats, or you have chest pain. You have shortness of breath or trouble breathing. You have swelling, redness, warmth, or pain in the arm in which the PICC is placed. You have a red streak going up your arm that  starts under the PICC dressing. These symptoms may be an emergency. Get help right away. Call 911. Do not wait to see if the symptoms will go away. Do not drive yourself to the hospital. Summary A peripherally inserted central catheter (PICC) is a long, thin, flexible tube (catheter) that is put into a vein in the arm or leg. If cared for properly, a PICC can remain in place for many months. Having a PICC can allow you to go home from the hospital sooner and continue treatment at home. The PICC is inserted using a germ-free (sterile) technique by a specially trained health care provider. Only a trained health care provider should remove it. Do not have your blood pressure checked on the arm in which your PICC is placed. Always keep your PICC identification card with you. This information is not intended to replace advice given to you by your health care provider. Make sure you discuss any questions you have with your health care provider. Document Revised: 07/18/2020 Document Reviewed: 07/18/2020 Elsevier Patient Education  2024 [employer].

## 2023-01-06 NOTE — Progress Notes (Signed)
[facility] OFFICE PROGRESS NOTE   Diagnosis: Colon cancer  INTERVAL HISTORY:   Ms. Miner returns as scheduled.  She was discharged from the hospital 12/26/2022 after admission with Klebsiella bacteremia.  She is scheduled for biliary stent exchange next month.  She reports sweats, but no recurrent fever.  She complains of persistent abdominal pain.  She is having bowel movements.  She is now taking Linzess. She uses hydrocodone and oxycodone as needed for pain.  She is not taking MS Contin.  Objective:  Vital signs in last 24 hours:  Blood pressure 109/88, pulse 97, temperature 98.2 F (36.8 C), temperature source Temporal, resp. rate 15, height 5\' 4"  (1.626 m), weight 134 lb 11.2 oz (61.1 kg), last menstrual period 01/07/2006, SpO2 98%.    Resp: Distant breath sounds, no respiratory distress Cardio: Regular rate and rhythm GI: No hepatosplenomegaly, tender in the lower abdomen, no mass Vascular: No leg edema   Portacath/PICC-without erythema  Lab Results:  Lab Results  Component Value Date   WBC 7.0 01/06/2023   HGB 11.7 (L) 01/06/2023   HCT 35.5 (L) 01/06/2023   MCV 90.8 01/06/2023   PLT 271 01/06/2023   NEUTROABS 3.1 01/06/2023    CMP  Lab Results  Component Value Date   NA 137 01/06/2023   K 3.1 (L) 01/06/2023   CL 94 (L) 01/06/2023   CO2 33 (H) 01/06/2023   GLUCOSE 106 (H) 01/06/2023   BUN 13 01/06/2023   CREATININE 0.60 01/06/2023   CALCIUM 9.8 01/06/2023   PROT 8.0 01/06/2023   ALBUMIN 4.1 01/06/2023   AST 61 (H) 01/06/2023   ALT 63 (H) 01/06/2023   ALKPHOS 586 (H) 01/06/2023   BILITOT 0.4 01/06/2023   GFRNONAA >60 01/06/2023   GFRAA >60 10/17/2019    Lab Results  Component Value Date   CEA1 <0.6 03/23/2022   CEA 5.90 (H) 12/03/2022   TFT732 213 (H) 03/23/2022     Medications: I have reviewed the patient's current medications.   Assessment/Plan: Sigmoid colon cancer, stage IV (pT4a,pN2b,M1c) Colonoscopy 03/24/2019-3  rectal polyps-hyperplastic polyps, distal colon biopsy-at least intramucosal adenocarcinoma, completely obstructing mass in the distal sigmoid colon, could not be traversed, intact mismatch repair protein expression 03/24/2019-CEA 56.1 03/29/2019 CT abdomen/pelvis-circumferential thickening involving the entire mid and distal sigmoid colon to the level of the rectosigmoid junction, solid/cystic mass in the left ovary, bilateral nephrolithiasis Robotic assisted low anterior resection, mesenteric lymphadenectomy, bilateral salpingo-oophorectomy 04/08/2019 Pathology ([facility] review of outside pathology) metastatic adenocarcinoma involving the peritoneum overlying the round ligament, serosa of the urinary bladder, serosa of the right ureter a sacral area, pelvic peritoneum, and left ovary.  Omental biopsy with focal mucin pools with no carcinoma cells identified, right hemidiaphragm biopsy involved by metastatic adenocarcinoma, invasive adenocarcinoma the sigmoid colon, moderately differentiated, T4a, perineural and vascular invasion present, 7/22 lymph nodes, multiple tumor deposits, resection margins negative Negative for PD-L1, low probability of MSI-high, HER-2 negative, negative for BRAF, NRAS and KRAS alterations CTs 05/19/2019-no evidence of metastatic disease, findings suspicious for colitis of the transverse and ascending colon, small amount of ascites in the cul-de-sac, bilateral renal calculi Cycle 1 FOLFIRI 05/24/2019, bevacizumab added with cycle 2 Cycle 4 FOLFIRI/bevacizumab 07/05/2019 Cycle 5 FOLFIRI 07/27/2019, bevacizumab held secondary to hypertension Cycle 6 FOLFIRI 08/10/1999, bevacizumab held secondary to hypertension CTs 08/30/2019-no evidence of recurrent disease, new subsolid right lower lobe nodule felt to be inflammatory, emphysema Cycle 7 FOLFIRI 09/26/2019, bevacizumab remains on hold secondary to hypertension Cycle 8 FOLFIRI 10/17/2019, bevacizumab  held secondary to hypertension, Udenyca added  for neutropenia Cycle 9 FOLFIRI 11/07/2019, bevacizumab held secondary to hypertension, Udenyca Cycle 10 FOLFIRI 11/28/2019, bevacizumab held, Udenyca Cycle 11 FOLFIRI 12/26/2019, bevacizumab held, Udenyca CTs 01/25/2020-no evidence of recurrent disease, resolution of right lower lobe nodule Maintenance Xeloda beginning 02/06/2020 CTs 04/25/2020- no evidence of metastatic disease, multiple bilateral renal calculi without hydronephrosis Maintenance Xeloda continued CT abdomen/pelvis without contrast 08/10/2020-bilateral staghorn renal calculi, no evidence of metastatic disease; addendum 08/23/2020-small but increasing omental nodules. Cycle 1 FOLFIRI/panitumumab 09/19/2020 Cycle 2 FOLFIRI/Panitumumab 10/03/2020 Cycle 3 FOLFIRI/Panitumumab 10/17/2020 Cycle 4 FOLFIRI/panitumumab 10/31/2020 Cycle 5 FOLFIRI/Panitumumab 11/14/2020 CTs 11/26/2020-stable omental metastases compared to 10/22/2020, mildly decreased from 08/10/2020.  Left lower quadrant tiny paracolic gutter implant slightly decreased from CT 08/10/2020.  No new or progressive metastatic disease in the abdomen or pelvis. Cycle 6 FOLFIRI/Panitumumab 11/28/2020, irinotecan dose reduced, treatment schedule adjusted to every 3 weeks going forward Cycle 7 FOLFIRI/panitumumab 12/24/2020 Cycle 8 FOLFIRI/Panitumumab 01/16/2021--treatment held due to hypertension in the infusion area. Cycle 8 FOLFIRI/panitumumab 01/21/2021 Cycle 9 FOLFIRI/panitumumab 02/11/2021 Cycle 10 FOLFIRI/Panitumumab 03/04/2021 Cycle 11 FOLFIRI/Panitumumab 03/25/2021 CT abdomen/pelvis 04/10/2021-stable right omental and left posterior paracolic gutter nodules Cycle 12 FOLFIRI/panitumumab 04/15/2021 Cycle 13 FOLFIRI/panitumumab 05/06/2021 Cycle 14 FOLFIRI/Panitumumab 05/27/2021 Cycle 15 FOLFIRI/Panitumumab 06/17/2021 CT abdomen/pelvis 07/05/2021-slight enlargement of a dominant right omental implant, no new implants Patient requested a treatment break CT abdomen/pelvis 10/04/2021-mild increase in  size of multiple omental soft tissue nodules, 4 mm calculus in the distal left ureter adjacent to the ureteral stent Cycle 1 Lonsurf 10/14/2021 10/28/2021 Avastin every 2 weeks Cycle 2 Lonsurf 11/11/2021 Cycle 3 Lonsurf 12/09/2021 Cycle 4 Lonsurf 01/06/2022 Avastin held 01/20/2022 due to proteinuria, 24-hour urine 43 mg CT/pelvis 01/30/2022-possible new peritoneal implant at the transverse colon, no significant change in other omental/peritoneal implants, stable chronic rectal wall thickening, status post removal of double-J left ureteral stent with no evidence of hydronephrosis or ureteral calculus, nonobstructing bilateral renal calculi Cycle 5 Lonsurf 02/05/2022 or 02/06/2022 Avastin every 2 weeks Cycle 6 Lonsurf 03/03/2022 CTs 04/05/2022-increase in size and number of peritoneal implants compared to January 2024 central liver mass narrowing the anterior branch of the right portal vein, decreased biliary duct dilation, biliary stents in place, stable right cardiophrenic lymph nodes, separate pigtail stent in the lumen of the proximal duodenum Cycle 1 FOLFOX 04/14/2022 CT abdomen/pelvis 04/16/2022-stent within the third portion of the duodenum, stable peritoneal nodules, indwelling biliary stents with mild left biliary duct dilation Cycle 2 FOLFOX 04/28/2022, Emend and prophylactic dexamethasone added Cycle 3 FOLFOX 05/12/2022, Aloxi, Emend, prophylactic dexamethasone, Compazine and lorazepam as needed Chemotherapy held 05/25/2020 due to severe hypertension, evaluated in the emergency department Cycle 4 FOLFOX 06/03/2022 Cycle 5 FOLFOX 06/16/2022, oxaliplatin dose reduced and 5-FU bolus eliminated due to neutropenia CT 06/25/2022-unchanged soft tissue at the hepatic hilum, unchanged, bile duct stent, decreased size of peritoneal and omental nodules and diminished volume of likely fluid in the lower left pelvis Cycle 6 FOLFOX 06/30/2022 Cycle 7 FOLFOX 07/21/2022 CT 07/24/2022 (in the emergency department with  nausea, headache, abdominal pain)-no acute pathology.  No significant interval change in omental nodularity. Cycle 8 FOLFOX 08/04/2022, pruritus on the palms at the end of the oxaliplatin infusion, Pepcid and Benadryl given, symptoms resolved, infusion not resumed Cycle 9 FOLFOX 08/18/2022-Pepcid and Benadryl added to premedications, Oxaliplatin diluted in a larger volume and infusion time increased Cycle 10 FOLFOX 09/08/2022 Cycle 11 FOLFOX 09/22/2022 10/06/2022 patient declined treatment CTs 10/15/2022-similar soft tissue fullness in the hepatic hilum with biliary stents in place,  decrease in omental/peritoneal metastases Maintenance capecitabine 10/26/2022 CT abdomen/pelvis 11/19/2022-mild increase in tumor studding at the left paracolic gutter and omentum, circumferential wall thickening of the rectum, biliary stents in place with minimal increase in intrahepatic biliary dilation, nonobstructive renal calculi Colonoscopy 11/23/2022-anastomosis at 15 cm from the anal verge-patent, 2 cm polypoid tissue at the anastomosis biopsied, invasive adenocarcinoma moderately differentiated, preserved expression of major MMR proteins CTs 12/15/2022: Possible developing nodule in the lateral left liver, increased omental nodularity, persistent wall thickening in the upper rectum CT abdomen/pelvis 12/24/2022: Increased thickened fold in the transverse colon-infectious/inflammatory, 2.1 cm lateral left liver lesion, potential new 5 mm segment 5 lesion, stable biliary stents with dramatic biliary dilation, central biliary wall thickening near the hepatic hilum, multiple peritoneal/omental implants unchanged from 12/15/2022, but likely larger than 11/19/2022   Hypertension G4 P3, twins Kidney stones-bilateral staghorn renal calculi on CT 08/10/2020 Infected Port-A-Cath 09/12/2019-placed on Augmentin, referred for Port-A-Cath removal; Port-A-Cath removed 09/14/2019; PICC line placed 09/14/2019; culture staph aureus.  Course of  Septra completed. Neutropenia secondary to chemotherapy-Udenyca added with cycle 8 FOLFIRI Right nephrostomy tube 09/12/2020; stent placed 10/09/2020, percutaneous nephrostolithotomy treatment of right-sided kidney stones 10/09/2020, malpositioned right ureter stent replacement 10/23/2020, right ureter stent removed 10/29/2020 Percutaneous left nephrostolithotomy 10/01/2021-no renal stone identified, left ureter stent left in place Cystoscopy 10/14/2021-stent removed 8.  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  Admission 04/16/2022 with nausea/vomiting and abdominal pain, felt to be acute toxicity related to chemotherapy, discharged home 04/18/2022 10.  Admission 11/19/2022 with nausea/vomiting and abdominal pain-improved 11.  Admission 12/15/2022 with fever and right submandibular swelling/tenderness CT neck 12/15/2022: 2 mm distal right submandibular duct calculus with mild inflammatory change adjacent to the right submandibular gland 12.  Admission 12/24/2022 with a fever nausea/vomiting, diarrhea, Klebsiella bacteremia completed course of Augmentin     Disposition: Ms. Uccello has metastatic colon cancer.  She appears stable.  The plan is to begin salvage therapy with irinotecan/panitumumab today. 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  I encouraged her to resume MS Contin twice daily.  She will continue hydrocodone as needed for breakthrough pain.  She will contact us if her pain is not relieved  with this regimen.  She continues a potassium supplement and will receive intravenous potassium today.  Ms. Gengler will return for an office visit and chemotherapy in 2 weeks.  She will return for PICC care next week.  Thornton Papas, MD  01/06/2023  9:49 AM

## 2023-01-06 NOTE — Progress Notes (Signed)
Patient seen by Dr. Thornton Papas today  Vitals are within treatment parameters:Yes   Labs are within treatment parameters: No (Please specify and give further instructions.) K+ 3.1--OK to proceed  Treatment plan has been signed: Yes   Per physician team, Patient is ready for treatment. Please note the following modifications: Please administer KCL 10 meq x 2 IV today.

## 2023-01-12 ENCOUNTER — Telehealth: Payer: Self-pay

## 2023-01-12 ENCOUNTER — Inpatient Hospital Stay: Payer: 59 | Admitting: Nurse Practitioner

## 2023-01-12 ENCOUNTER — Inpatient Hospital Stay: Payer: 59

## 2023-01-12 ENCOUNTER — Other Ambulatory Visit: Payer: 59

## 2023-01-12 VITALS — BP 128/90 | HR 84 | Temp 98.2°F | Resp 18

## 2023-01-12 DIAGNOSIS — Z95828 Presence of other vascular implants and grafts: Secondary | ICD-10-CM

## 2023-01-12 DIAGNOSIS — C19 Malignant neoplasm of rectosigmoid junction: Secondary | ICD-10-CM

## 2023-01-12 DIAGNOSIS — Z5111 Encounter for antineoplastic chemotherapy: Secondary | ICD-10-CM | POA: Diagnosis not present

## 2023-01-12 LAB — CMP (CANCER CENTER ONLY)
ALT: 28 U/L (ref 0–44)
AST: 32 U/L (ref 15–41)
Albumin: 4 g/dL (ref 3.5–5.0)
Alkaline Phosphatase: 564 U/L — ABNORMAL HIGH (ref 38–126)
Anion gap: 9 (ref 5–15)
BUN: 11 mg/dL (ref 6–20)
CO2: 29 mmol/L (ref 22–32)
Calcium: 9.2 mg/dL (ref 8.9–10.3)
Chloride: 98 mmol/L (ref 98–111)
Creatinine: 0.48 mg/dL (ref 0.44–1.00)
GFR, Estimated: >60 mL/min (ref >60)
Glucose, Bld: 75 mg/dL (ref 70–99)
Potassium: 3.2 mmol/L — ABNORMAL LOW (ref 3.5–5.1)
Sodium: 136 mmol/L (ref 135–145)
Total Bilirubin: 0.4 mg/dL (ref <1.2)
Total Protein: 7.8 g/dL (ref 6.5–8.1)

## 2023-01-12 MED ORDER — SODIUM CHLORIDE 0.9% FLUSH
10.0000 mL | Freq: Once | INTRAVENOUS | Status: AC
Start: 2023-01-12 — End: 2023-01-12
  Administered 2023-01-12: 10 mL via INTRAVENOUS

## 2023-01-12 MED ORDER — HEPARIN SOD (PORK) LOCK FLUSH 100 UNIT/ML IV SOLN
250.0000 [IU] | Freq: Once | INTRAVENOUS | Status: AC
Start: 2023-01-12 — End: 2023-01-12
  Administered 2023-01-12: 250 [IU] via INTRAVENOUS

## 2023-01-12 MED ORDER — HEPARIN SOD (PORK) LOCK FLUSH 100 UNIT/ML IV SOLN
250.0000 [IU] | Freq: Once | INTRAVENOUS | Status: AC
  Administered 2023-01-12: 250 [IU] via INTRAVENOUS

## 2023-01-12 NOTE — Patient Instructions (Signed)
PICC Home Care Guide A peripherally inserted central catheter (PICC) is a form of IV access that allows medicines and IV fluids to be quickly put into the blood and spread throughout the body. The PICC is a long, thin, flexible tube (catheter) that is put into a vein in a person's arm or leg. The catheter ends in a large vein just outside the heart called the superior vena cava (SVC). After the PICC is put in, a chest X-ray may be done to make sure that it is in the right place. A PICC may be placed for different reasons, such as: To give medicines and liquid nutrition. To give IV fluids and blood products. To take blood samples often. If there is trouble placing a peripheral intravenous (PIV) catheter. If cared for properly, a PICC can remain in place for many months. Having a PICC can allow you to go home from the hospital sooner and continue treatment at home. Medicines and PICC care can be managed at home by a family member, caregiver, or home health care team. What are the risks? Generally, having a PICC is safe. However, problems may occur, including: A blood clot (thrombus) forming in or at the end of the PICC. A blood clot forming in a vein (deep vein thrombosis) or traveling to the lung (pulmonary embolism). Inflammation of the vein (phlebitis) in which the PICC is placed. Infection at the insertion site or in the blood. Blood infections from central lines, like PICCs, can be serious and often require a hospital stay. PICC malposition, or PICC movement or poor placement. A break or cut in the PICC. Do not use scissors near the PICC. Nerve or tendon irritation or injury during PICC insertion. How to care for your PICC Please follow the specific guidelines provided by your health care provider. Preventing infection You and any caregivers should wash your hands often with soap and water for at least 20 seconds. Wash hands: Before touching the PICC or the infusion device. Before changing a  bandage (dressing). Do not change the dressing unless you have been taught to do so and have shown you are able to change it safely. Flush the PICC as told. Tell your health care provider right away if the PICC is hard to flush or does not flush. Do not use force to flush the PICC. Use clean and germ-free (sterile) supplies only. Keep the supplies in a dry place. Do not reuse needles, syringes, or any other supplies. Reusing supplies can lead to infection. Keep the PICC dressing dry and secure it with tape if the edges stop sticking to your skin. Check your PICC insertion site every day for signs of infection. Check for: Redness, swelling, or pain. Fluid or blood. Warmth. Pus or a bad smell. Preventing other problems Do not use a syringe that is less than 10 mL to flush the PICC. Do not have your blood pressure checked on the arm in which the PICC is placed. Do not ever pull or tug on the PICC. Keep it secured to your arm with tape or a stretch wrap when not in use. Do not take the PICC out yourself. Only a trained health care provider should remove the PICC. Keep pets and children away from your PICC. How to care for your PICC dressing Keep your PICC dressing clean and dry to prevent infection. Do not take baths, swim, or use a hot tub until your health care provider approves. Ask your health care provider if you can take  showers. You may only be allowed to take sponge baths. When you are allowed to shower: Ask your health care provider to teach you how to wrap the PICC. Cover the PICC with clear plastic wrap and tape to keep it dry while showering. Follow instructions from your health care provider about how to take care of your insertion site and dressing. Make sure you: Wash your hands with soap and water for at least 20 seconds before and after you change your dressing. If soap and water are not available, use hand sanitizer. Change your dressing only if taught to do so by your health care  provider. Your PICC dressing needs to be changed if it becomes loose or wet. Leave stitches (sutures), skin glue, or adhesive strips in place. These skin closures may need to stay in place for 2 weeks or longer. If adhesive strip edges start to loosen and curl up, you may trim the loose edges. Do not remove adhesive strips completely unless your health care provider tells you to do that. Follow these instructions at home: Disposal of supplies Throw away any syringes in a disposal container that is meant for sharp items (sharps container). You can buy a sharps container from a pharmacy, or you can make one by using an empty, hard plastic bottle with a lid. Place any used dressings or infusion bags into a plastic bag. Throw that bag in the trash. General instructions  Always carry your PICC identification card or wear a medical alert bracelet. Keep the tube clamped at all times, unless it is being used. Always carry a smooth-edge clamp with you to clamp the PICC if it breaks. Do not use scissors or sharp objects near the tube. You may bend your arm and move it freely. If your PICC is near or at the bend of your elbow, avoid activity with repeated motion at the elbow. Avoid lifting heavy objects as told by your health care provider. Keep all follow-up visits. This is important. You will need to have your PICC dressing changed at least once a week. Contact a health care provider if: You have pain in your arm, ear, face, or teeth. You have a fever or chills. You have redness, swelling, or pain around the insertion site. You have fluid or blood coming from the insertion site. Your insertion site feels warm to the touch. You have pus or a bad smell coming from the insertion site. Your skin feels hard and raised around the insertion site. Your PICC dressing has gotten wet or is coming off and you have not been taught how to change it. Get help right away if: You have problems with your PICC, such as  your PICC: Was tugged or pulled and has partially come out. Do not  push the PICC back in. Cannot be flushed, is hard to flush, or leaks around the insertion site when it is flushed. Makes a flushing sound when it is flushed. Appears to have a hole or tear. Is accidentally pulled all the way out. If this happens, cover the insertion site with a gauze dressing. Do not throw the PICC away. Your health care provider will need to check it to be sure the entire catheter came out. You feel your heart racing or skipping beats, or you have chest pain. You have shortness of breath or trouble breathing. You have swelling, redness, warmth, or pain in the arm in which the PICC is placed. You have a red streak going up your arm that  starts under the PICC dressing. These symptoms may be an emergency. Get help right away. Call 911. Do not wait to see if the symptoms will go away. Do not drive yourself to the hospital. Summary A peripherally inserted central catheter (PICC) is a long, thin, flexible tube (catheter) that is put into a vein in the arm or leg. If cared for properly, a PICC can remain in place for many months. Having a PICC can allow you to go home from the hospital sooner and continue treatment at home. The PICC is inserted using a germ-free (sterile) technique by a specially trained health care provider. Only a trained health care provider should remove it. Do not have your blood pressure checked on the arm in which your PICC is placed. Always keep your PICC identification card with you. This information is not intended to replace advice given to you by your health care provider. Make sure you discuss any questions you have with your health care provider. Document Revised: 07/18/2020 Document Reviewed: 07/18/2020 Elsevier Patient Education  2024 [employer].

## 2023-01-12 NOTE — Telephone Encounter (Addendum)
Left voicemail stating to call [facility] regarding potassium remains low, is pt taking potassium as prescribed 20 mEq BID. If so provider wants to increase to 40 mEq a.m., 20 mEq p.m. just to follow-up as scheduled.

## 2023-01-13 ENCOUNTER — Telehealth: Payer: Self-pay

## 2023-01-13 NOTE — Telephone Encounter (Signed)
Patient gave verbal understanding had no further questions or concerns. 

## 2023-01-13 NOTE — Telephone Encounter (Signed)
-----   Message from Arley Hof sent at 01/12/2023 10:22 AM EST ----- Please call patient, the potassium remains low, is she taking potassium as prescribed (20 mill equivalents twice daily) if yes, increase to 40 mEq a.m., 20 mill equivalents p.m., follow-up as scheduled

## 2023-01-15 ENCOUNTER — Other Ambulatory Visit (HOSPITAL_COMMUNITY): Payer: Self-pay

## 2023-01-15 ENCOUNTER — Telehealth: Payer: Self-pay

## 2023-01-15 ENCOUNTER — Telehealth: Payer: Self-pay | Admitting: Nurse Practitioner

## 2023-01-15 ENCOUNTER — Other Ambulatory Visit: Payer: Self-pay | Admitting: Nurse Practitioner

## 2023-01-15 DIAGNOSIS — C19 Malignant neoplasm of rectosigmoid junction: Secondary | ICD-10-CM

## 2023-01-15 MED ORDER — MORPHINE SULFATE ER 30 MG PO TBCR
30.0000 mg | EXTENDED_RELEASE_TABLET | Freq: Two times a day (BID) | ORAL | 0 refills | Status: DC
  Filled 2023-01-15: qty 60, 30d supply, fill #0

## 2023-01-15 MED ORDER — HYDROCODONE-ACETAMINOPHEN 10-325 MG PO TABS
1.0000 | ORAL_TABLET | ORAL | 0 refills | Status: DC | PRN
  Filled 2023-01-15: qty 60, 10d supply, fill #0

## 2023-01-15 NOTE — Telephone Encounter (Signed)
 I spoke to Ms. Leyh regarding her earlier conversation with our office regarding pain control.  She is having increased abdominal pain.  She is currently taking MS Contin  15 mg every 12 hours (started taking about a month ago and has been taking consistently) and hydrocodone  every 4 hours around-the-clock, 1 or 2 tablets.  She has tried oxycodone  but does not want to continue due to concerns regarding constipation.  We discussed increasing the MS Contin  to 30 mg every 12 hours, continuing hydrocodone  10/325 1 tablet every 4 hours as needed.  She agrees with this plan.  I will send the prescriptions to her pharmacy.

## 2023-01-15 NOTE — Telephone Encounter (Signed)
 The patient contacted us  to request a refill for her Norco medication, and to have it sent to [facility]. She reports experiencing persistent pain throughout the day and feels that the current medication regimen is not providing adequate relief. The patient is taking Ms. Contin at a dosage of 1 tablet every 12 hours and Norco at a dosage of 1 tablet every 4 hours, but continues to seek more effective pain management.

## 2023-01-16 ENCOUNTER — Other Ambulatory Visit: Payer: Self-pay | Admitting: Oncology

## 2023-01-20 ENCOUNTER — Encounter: Payer: Self-pay | Admitting: Nurse Practitioner

## 2023-01-20 ENCOUNTER — Other Ambulatory Visit (HOSPITAL_BASED_OUTPATIENT_CLINIC_OR_DEPARTMENT_OTHER): Payer: Self-pay

## 2023-01-20 ENCOUNTER — Inpatient Hospital Stay: Payer: 59

## 2023-01-20 ENCOUNTER — Inpatient Hospital Stay (HOSPITAL_BASED_OUTPATIENT_CLINIC_OR_DEPARTMENT_OTHER): Payer: 59 | Admitting: Nurse Practitioner

## 2023-01-20 ENCOUNTER — Inpatient Hospital Stay: Payer: 59 | Attending: Oncology

## 2023-01-20 VITALS — BP 127/97 | HR 78

## 2023-01-20 VITALS — BP 138/89 | HR 100 | Temp 98.1°F | Resp 18 | Ht 64.0 in | Wt 137.3 lb

## 2023-01-20 DIAGNOSIS — C19 Malignant neoplasm of rectosigmoid junction: Secondary | ICD-10-CM

## 2023-01-20 DIAGNOSIS — R197 Diarrhea, unspecified: Secondary | ICD-10-CM | POA: Insufficient documentation

## 2023-01-20 DIAGNOSIS — Z452 Encounter for adjustment and management of vascular access device: Secondary | ICD-10-CM | POA: Insufficient documentation

## 2023-01-20 DIAGNOSIS — Z5111 Encounter for antineoplastic chemotherapy: Secondary | ICD-10-CM | POA: Insufficient documentation

## 2023-01-20 DIAGNOSIS — C187 Malignant neoplasm of sigmoid colon: Secondary | ICD-10-CM | POA: Diagnosis present

## 2023-01-20 DIAGNOSIS — R112 Nausea with vomiting, unspecified: Secondary | ICD-10-CM | POA: Insufficient documentation

## 2023-01-20 DIAGNOSIS — Z5112 Encounter for antineoplastic immunotherapy: Secondary | ICD-10-CM | POA: Diagnosis present

## 2023-01-20 DIAGNOSIS — Z79899 Other long term (current) drug therapy: Secondary | ICD-10-CM | POA: Insufficient documentation

## 2023-01-20 DIAGNOSIS — C786 Secondary malignant neoplasm of retroperitoneum and peritoneum: Secondary | ICD-10-CM | POA: Diagnosis not present

## 2023-01-20 LAB — CBC WITH DIFFERENTIAL (CANCER CENTER ONLY)
Abs Immature Granulocytes: 0.02 10*3/uL (ref 0.00–0.07)
Basophils Absolute: 0.1 10*3/uL (ref 0.0–0.1)
Basophils Relative: 1 %
Eosinophils Absolute: 0.8 10*3/uL — ABNORMAL HIGH (ref 0.0–0.5)
Eosinophils Relative: 10 %
HCT: 32.2 % — ABNORMAL LOW (ref 36.0–46.0)
Hemoglobin: 10.9 g/dL — ABNORMAL LOW (ref 12.0–15.0)
Immature Granulocytes: 0 %
Lymphocytes Relative: 38 %
Lymphs Abs: 2.9 10*3/uL (ref 0.7–4.0)
MCH: 30.6 pg (ref 26.0–34.0)
MCHC: 33.9 g/dL (ref 30.0–36.0)
MCV: 90.4 fL (ref 80.0–100.0)
Monocytes Absolute: 0.6 10*3/uL (ref 0.1–1.0)
Monocytes Relative: 8 %
Neutro Abs: 3.2 10*3/uL (ref 1.7–7.7)
Neutrophils Relative %: 43 %
Platelet Count: 348 10*3/uL (ref 150–400)
RBC: 3.56 MIL/uL — ABNORMAL LOW (ref 3.87–5.11)
RDW: 14.1 % (ref 11.5–15.5)
WBC Count: 7.6 10*3/uL (ref 4.0–10.5)
nRBC: 0 % (ref 0.0–0.2)

## 2023-01-20 LAB — URINALYSIS, COMPLETE (UACMP) WITH MICROSCOPIC
Bilirubin Urine: NEGATIVE
Glucose, UA: NEGATIVE mg/dL
Ketones, ur: NEGATIVE mg/dL
Leukocytes,Ua: NEGATIVE
Nitrite: NEGATIVE
Specific Gravity, Urine: 1.021 (ref 1.005–1.030)
pH: 5.5 (ref 5.0–8.0)

## 2023-01-20 LAB — CMP (CANCER CENTER ONLY)
ALT: 25 U/L (ref 0–44)
AST: 38 U/L (ref 15–41)
Albumin: 3.8 g/dL (ref 3.5–5.0)
Alkaline Phosphatase: 448 U/L — ABNORMAL HIGH (ref 38–126)
Anion gap: 10 (ref 5–15)
BUN: 7 mg/dL (ref 6–20)
CO2: 29 mmol/L (ref 22–32)
Calcium: 9.3 mg/dL (ref 8.9–10.3)
Chloride: 101 mmol/L (ref 98–111)
Creatinine: 0.39 mg/dL — ABNORMAL LOW (ref 0.44–1.00)
GFR, Estimated: >60 mL/min (ref >60)
Glucose, Bld: 92 mg/dL (ref 70–99)
Potassium: 3.3 mmol/L — ABNORMAL LOW (ref 3.5–5.1)
Sodium: 140 mmol/L (ref 135–145)
Total Bilirubin: 0.3 mg/dL (ref 0.0–1.2)
Total Protein: 7.6 g/dL (ref 6.5–8.1)

## 2023-01-20 LAB — MAGNESIUM: Magnesium: 1.8 mg/dL (ref 1.7–2.4)

## 2023-01-20 MED ORDER — DICYCLOMINE HCL 20 MG PO TABS
20.0000 mg | ORAL_TABLET | Freq: Three times a day (TID) | ORAL | 1 refills | Status: DC | PRN
  Filled 2023-01-20 – 2023-01-21 (×2): qty 60, 20d supply, fill #0

## 2023-01-20 MED ORDER — PALONOSETRON HCL INJECTION 0.25 MG/5ML
0.2500 mg | Freq: Once | INTRAVENOUS | Status: AC
Start: 2023-01-20 — End: 2023-01-20
  Administered 2023-01-20: 0.25 mg via INTRAVENOUS
  Filled 2023-01-20: qty 5

## 2023-01-20 MED ORDER — DEXAMETHASONE SODIUM PHOSPHATE 10 MG/ML IJ SOLN
10.0000 mg | Freq: Once | INTRAMUSCULAR | Status: AC
  Administered 2023-01-20: 10 mg via INTRAVENOUS
  Filled 2023-01-20: qty 1

## 2023-01-20 MED ORDER — SODIUM CHLORIDE 0.9% FLUSH
10.0000 mL | INTRAVENOUS | Status: DC | PRN
  Administered 2023-01-20: 10 mL

## 2023-01-20 MED ORDER — ATROPINE SULFATE 1 MG/ML IV SOLN
0.5000 mg | Freq: Once | INTRAVENOUS | Status: AC
  Administered 2023-01-20: 0.5 mg via INTRAVENOUS
  Filled 2023-01-20: qty 1

## 2023-01-20 MED ORDER — HEPARIN SOD (PORK) LOCK FLUSH 100 UNIT/ML IV SOLN
500.0000 [IU] | Freq: Once | INTRAVENOUS | Status: AC | PRN
  Administered 2023-01-20: 500 [IU]

## 2023-01-20 MED ORDER — SODIUM CHLORIDE 0.9 % IV SOLN
INTRAVENOUS | Status: DC
Start: 2023-01-20 — End: 2023-01-20

## 2023-01-20 MED ORDER — SODIUM CHLORIDE 0.9 % IV SOLN
125.0000 mg/m2 | Freq: Once | INTRAVENOUS | Status: AC
Start: 2023-01-20 — End: 2023-01-20
  Administered 2023-01-20: 200 mg via INTRAVENOUS
  Filled 2023-01-20: qty 10

## 2023-01-20 MED ORDER — SODIUM CHLORIDE 0.9 % IV SOLN
6.0000 mg/kg | Freq: Once | INTRAVENOUS | Status: AC
Start: 2023-01-20 — End: 2023-01-20
  Administered 2023-01-20: 400 mg via INTRAVENOUS
  Filled 2023-01-20: qty 20

## 2023-01-20 NOTE — Patient Instructions (Signed)
 [facility] - A DEPT OF [facility]   Discharge Instructions: Thank you for choosing [facility] to provide your oncology and hematology care.   If you have a lab appointment with the Cancer Center, please go directly to the Cancer Center and check in at the registration area.   Wear comfortable clothing and clothing appropriate for easy access to any Portacath or PICC line.   We strive to give you quality time with your provider. You may need to reschedule your appointment if you arrive late (15 or more minutes).  Arriving late affects you and other patients whose appointments are after yours.  Also, if you miss three or more appointments without notifying the office, you may be dismissed from the clinic at the provider's discretion.      For prescription refill requests, have your pharmacy contact our office and allow 72 hours for refills to be completed.    Today you received the following chemotherapy and/or immunotherapy agents VECTIBIX/IRINOTECAN.      To help prevent nausea and vomiting after your treatment, we encourage you to take your nausea medication as directed.  BELOW ARE SYMPTOMS THAT SHOULD BE REPORTED IMMEDIATELY: *FEVER GREATER THAN 100.4 F (38 C) OR HIGHER *CHILLS OR SWEATING *NAUSEA AND VOMITING THAT IS NOT CONTROLLED WITH YOUR NAUSEA MEDICATION *UNUSUAL SHORTNESS OF BREATH *UNUSUAL BRUISING OR BLEEDING *URINARY PROBLEMS (pain or burning when urinating, or frequent urination) *BOWEL PROBLEMS (unusual diarrhea, constipation, pain near the anus) TENDERNESS IN MOUTH AND THROAT WITH OR WITHOUT PRESENCE OF ULCERS (sore throat, sores in mouth, or a toothache) UNUSUAL RASH, SWELLING OR PAIN  UNUSUAL VAGINAL DISCHARGE OR ITCHING   Items with * indicate a potential emergency and should be followed up as soon as possible or go to the Emergency Department if any problems should occur.  Please show the CHEMOTHERAPY ALERT CARD or  IMMUNOTHERAPY ALERT CARD at check-in to the Emergency Department and triage nurse.  Should you have questions after your visit or need to cancel or reschedule your appointment, please contact [facility]  Dept: [phone]  and follow the prompts.  Office hours are 8:00 a.m. to 4:30 p.m. Monday - Friday. Please note that voicemails left after 4:00 p.m. may not be returned until the following business day.  We are closed weekends and major holidays. You have access to a nurse at all times for urgent questions. Please call the main number to the clinic Dept: [phone] and follow the prompts.   For any non-urgent questions, you may also contact your provider using MyChart. We now offer e-Visits for anyone 9 and older to request care online for non-urgent symptoms. For details visit [email or web address].   Also download the MyChart app! Go to the app store, search "MyChart", open the app, select [facility], and log in with your MyChart username and password.  Panitumumab Injection What is this medication? PANITUMUMAB (pan i TOOM ue mab) treats colorectal cancer. It works by blocking a protein that causes cancer cells to grow and multiply. This helps to slow or stop the spread of cancer cells. It is a monoclonal antibody. This medicine may be used for other purposes; ask your health care provider or pharmacist if you have questions. COMMON BRAND NAME(S): Vectibix What should I tell my care team before I take this medication? They need to know if you have any of these conditions: Eye disease Low levels  of magnesium in the blood Lung disease An unusual or allergic reaction to panitumumab, other medications, foods, dyes, or preservatives Pregnant or trying to get pregnant Breast-feeding How should I use this medication? This medication is injected into a vein. It is given by your care team in a hospital or clinic setting. Talk to your  care team about the use of this medication in children. Special care may be needed. Overdosage: If you think you have taken too much of this medicine contact a poison control center or emergency room at once. NOTE: This medicine is only for you. Do not share this medicine with others. What if I miss a dose? Keep appointments for follow-up doses. It is important not to miss your dose. Call your care team if you are unable to keep an appointment. What may interact with this medication? Bevacizumab This list may not describe all possible interactions. Give your health care provider a list of all the medicines, herbs, non-prescription drugs, or dietary supplements you use. Also tell them if you smoke, drink alcohol, or use illegal drugs. Some items may interact with your medicine. What should I watch for while using this medication? Your condition will be monitored carefully while you are receiving this medication. This medication may make you feel generally unwell. This is not uncommon as chemotherapy can affect healthy cells as well as cancer cells. Report any side effects. Continue your course of treatment even though you feel ill unless your care team tells you to stop. This medication can make you more sensitive to the sun. Keep out of the sun while receiving this medication and for 2 months after stopping therapy. If you cannot avoid being in the sun, wear protective clothing and sunscreen. Do not use sun lamps, tanning beds, or tanning booths. Check with your care team if you have severe diarrhea, nausea, and vomiting or if you sweat a lot. The loss of too much body fluid may make it dangerous for you to take this medication. This medication may cause serious skin reactions. They can happen weeks to months after starting the medication. Contact your care team right away if you notice fevers or flu-like symptoms with a rash. The rash may be red or purple and then turn into blisters or peeling of the  skin. You may also notice a red rash with swelling of the face, lips, or lymph nodes in your neck or under your arms. Talk to your care team if you may be pregnant. Serious birth defects can occur if you take this medication during pregnancy and for 2 months after the last dose. Contraception is recommended while taking this medication and for 2 months after the last dose. Your care team can help you find the option that works for you. Do not breastfeed while taking this medication and for 2 months after the last dose. This medication may cause infertility. Talk to your care team if you are concerned about your fertility. What side effects may I notice from receiving this medication? Side effects that you should report to your care team as soon as possible: Allergic reactions--skin rash, itching, hives, swelling of the face, lips, tongue, or throat Dry cough, shortness of breath or trouble breathing Eye pain, redness, irritation, or discharge with blurry or decreased vision Infusion reactions--chest pain, shortness of breath or trouble breathing, feeling faint or lightheaded Low magnesium level--muscle pain or cramps, unusual weakness or fatigue, fast or irregular heartbeat, tremors Low potassium level--muscle pain or cramps, unusual weakness or  fatigue, fast or irregular heartbeat, constipation Redness, blistering, peeling, or loosening of the skin, including inside the mouth Skin reactions on sun-exposed areas Side effects that usually do not require medical attention (report to your care team if they continue or are bothersome): Change in nail shape, thickness, or color Diarrhea Dry skin Fatigue Nausea Vomiting This list may not describe all possible side effects. Call your doctor for medical advice about side effects. You may report side effects to FDA at 1-800-FDA-1088. Where should I keep my medication? This medication is given in a hospital or clinic. It will not be stored at  home. NOTE: This sheet is a summary. It may not cover all possible information. If you have questions about this medicine, talk to your doctor, pharmacist, or health care provider.  2024 Elsevier/Gold Standard (2021-05-15 00:00:00)  Irinotecan Injection What is this medication? IRINOTECAN (ir in oh TEE kan) treats some types of cancer. It works by slowing down the growth of cancer cells. This medicine may be used for other purposes; ask your health care provider or pharmacist if you have questions. COMMON BRAND NAME(S): Camptosar What should I tell my care team before I take this medication? They need to know if you have any of these conditions: Dehydration Diarrhea Infection, especially a viral infection, such as chickenpox, cold sores, herpes Liver disease Low blood cell levels (white cells, red cells, and platelets) Low levels of electrolytes, such as calcium, magnesium, or potassium in your blood Recent or ongoing radiation An unusual or allergic reaction to irinotecan, other medications, foods, dyes, or preservatives If you or your partner are pregnant or trying to get pregnant Breast-feeding How should I use this medication? This medication is injected into a vein. It is given by your care team in a hospital or clinic setting. Talk to your care team about the use of this medication in children. Special care may be needed. Overdosage: If you think you have taken too much of this medicine contact a poison control center or emergency room at once. NOTE: This medicine is only for you. Do not share this medicine with others. What if I miss a dose? Keep appointments for follow-up doses. It is important not to miss your dose. Call your care team if you are unable to keep an appointment. What may interact with this medication? Do not take this medication with any of the following: Cobicistat Itraconazole This medication may also interact with the following: Certain antibiotics, such  as clarithromycin, rifampin, rifabutin Certain antivirals for HIV or AIDS Certain medications for fungal infections, such as ketoconazole, posaconazole, voriconazole Certain medications for seizures, such as carbamazepine, phenobarbital, phenytoin Gemfibrozil Nefazodone St. John's wort This list may not describe all possible interactions. Give your health care provider a list of all the medicines, herbs, non-prescription drugs, or dietary supplements you use. Also tell them if you smoke, drink alcohol, or use illegal drugs. Some items may interact with your medicine. What should I watch for while using this medication? Your condition will be monitored carefully while you are receiving this medication. You may need blood work while taking this medication. This medication may make you feel generally unwell. This is not uncommon as chemotherapy can affect healthy cells as well as cancer cells. Report any side effects. Continue your course of treatment even though you feel ill unless your care team tells you to stop. This medication can cause serious side effects. To reduce the risk, your care team may give you other medications to take  before receiving this one. Be sure to follow the directions from your care team. This medication may affect your coordination, reaction time, or judgement. Do not drive or operate machinery until you know how this medication affects you. Sit up or stand slowly to reduce the risk of dizzy or fainting spells. Drinking alcohol with this medication can increase the risk of these side effects. This medication may increase your risk of getting an infection. Call your care team for advice if you get a fever, chills, sore throat, or other symptoms of a cold or flu. Do not treat yourself. Try to avoid being around people who are sick. Avoid taking medications that contain aspirin, acetaminophen, ibuprofen, naproxen, or ketoprofen unless instructed by your care team. These  medications may hide a fever. This medication may increase your risk to bruise or bleed. Call your care team if you notice any unusual bleeding. Be careful brushing or flossing your teeth or using a toothpick because you may get an infection or bleed more easily. If you have any dental work done, tell your dentist you are receiving this medication. Talk to your care team if you or your partner are pregnant or think either of you might be pregnant. This medication can cause serious birth defects if taken during pregnancy and for 6 months after the last dose. You will need a negative pregnancy test before starting this medication. Contraception is recommended while taking this medication and for 6 months after the last dose. Your care team can help you find the option that works for you. Do not father a child while taking this medication and for 3 months after the last dose. Use a condom for contraception during this time period. Do not breastfeed while taking this medication and for 7 days after the last dose. This medication may cause infertility. Talk to your care team if you are concerned about your fertility. What side effects may I notice from receiving this medication? Side effects that you should report to your care team as soon as possible: Allergic reactions--skin rash, itching, hives, swelling of the face, lips, tongue, or throat Dry cough, shortness of breath or trouble breathing Increased saliva or tears, increased sweating, stomach cramping, diarrhea, small pupils, unusual weakness or fatigue, slow heartbeat Infection--fever, chills, cough, sore throat, wounds that don't heal, pain or trouble when passing urine, general feeling of discomfort or being unwell Kidney injury--decrease in the amount of urine, swelling of the ankles, hands, or feet Low red blood cell level--unusual weakness or fatigue, dizziness, headache, trouble breathing Severe or prolonged diarrhea Unusual bruising or  bleeding Side effects that usually do not require medical attention (report to your care team if they continue or are bothersome): Constipation Diarrhea Hair loss Loss of appetite Nausea Stomach pain This list may not describe all possible side effects. Call your doctor for medical advice about side effects. You may report side effects to FDA at 1-800-FDA-1088. Where should I keep my medication? This medication is given in a hospital or clinic. It will not be stored at home. NOTE: This sheet is a summary. It may not cover all possible information. If you have questions about this medicine, talk to your doctor, pharmacist, or health care provider.  2024 Elsevier/Gold Standard (2021-05-13 00:00:00)

## 2023-01-20 NOTE — Progress Notes (Addendum)
 Patient seen by Olam Ned NP today  Vitals are within treatment parameters:Yes   Labs are within treatment parameters: No (Please specify and give further instructions.) k+ 3.3 alkaline phosphatase 448  Treatment plan has been signed: Yes   Per physician team, Patient is ready for treatment and there are NO modifications to the treatment plan.

## 2023-01-20 NOTE — Progress Notes (Signed)
 April [name] OFFICE PROGRESS NOTE   Diagnosis: Colon cancer  INTERVAL HISTORY:   April [name] returns as scheduled.  She completed cycle 1 irinotecan /Panitumumab  01/06/2023.  She has intermittent nausea not related to treatment.  No vomiting.  No mouth sores.  She had some loose stools.  No rash.  She feels she is not completely emptying her bladder.  Since increasing the dose of MS Contin  she notes taking less hydrocodone .  Objective:  Vital signs in last 24 hours:  Blood pressure 138/89, pulse 100, temperature 98.1 F (36.7 C), temperature source Temporal, resp. rate 18, height 5' 4 (1.626 m), weight 137 lb 4.8 oz (62.3 kg), last menstrual period 01/07/2006, SpO2 100%.    HEENT: No thrush or ulcers. Resp: Lungs clear bilaterally. Cardio: Regular rate and rhythm. GI: No hepatosplenomegaly.  Tender at the upper abdomen.  No mass. Vascular: No leg edema. Skin: No rash. Left upper extremity PICC without erythema.  Lab Results:  Lab Results  Component Value Date   WBC 7.6 01/20/2023   HGB 10.9 (L) 01/20/2023   HCT 32.2 (L) 01/20/2023   MCV 90.4 01/20/2023   PLT 348 01/20/2023   NEUTROABS 3.2 01/20/2023    Imaging:  No results found.  Medications: I have reviewed the patient's current medications.  Assessment/Plan: Sigmoid colon cancer, stage IV (pT4a,pN2b,M1c) Colonoscopy 03/24/2019-3 rectal polyps-hyperplastic polyps, distal colon biopsy-at least intramucosal adenocarcinoma, completely obstructing mass in the distal sigmoid colon, could not be traversed, intact mismatch repair protein expression 03/24/2019-CEA 56.1 03/29/2019 CT abdomen/pelvis-circumferential thickening involving the entire mid and distal sigmoid colon to the level of the rectosigmoid junction, solid/cystic mass in the left ovary, bilateral nephrolithiasis Robotic assisted low anterior resection, mesenteric lymphadenectomy, bilateral salpingo-oophorectomy 04/08/2019 Pathology ([facility] review  of outside pathology) metastatic adenocarcinoma involving the peritoneum overlying the round ligament, serosa of the urinary bladder, serosa of the right ureter a sacral area, pelvic peritoneum, and left ovary.  Omental biopsy with focal mucin pools with no carcinoma cells identified, right hemidiaphragm biopsy involved by metastatic adenocarcinoma, invasive adenocarcinoma the sigmoid colon, moderately differentiated, T4a, perineural and vascular invasion present, 7/22 lymph nodes, multiple tumor deposits, resection margins negative Negative for PD-L1, low probability of MSI-high, HER-2 negative, negative for BRAF, NRAS and KRAS alterations CTs 05/19/2019-no evidence of metastatic disease, findings suspicious for colitis of the transverse and ascending colon, small amount of ascites in the cul-de-sac, bilateral renal calculi Cycle 1 FOLFIRI 05/24/2019, bevacizumab  added with cycle 2 Cycle 4 FOLFIRI/bevacizumab  07/05/2019 Cycle 5 FOLFIRI 07/27/2019, bevacizumab  held secondary to hypertension Cycle 6 FOLFIRI 08/10/1999, bevacizumab  held secondary to hypertension CTs 08/30/2019-no evidence of recurrent disease, new subsolid right lower lobe nodule felt to be inflammatory, emphysema Cycle 7 FOLFIRI 09/26/2019, bevacizumab  remains on hold secondary to hypertension Cycle 8 FOLFIRI 10/17/2019, bevacizumab  held secondary to hypertension, Udenyca  added for neutropenia Cycle 9 FOLFIRI 11/07/2019, bevacizumab  held secondary to hypertension, Udenyca  Cycle 10 FOLFIRI 11/28/2019, bevacizumab  held, Udenyca  Cycle 11 FOLFIRI 12/26/2019, bevacizumab  held, Udenyca  CTs 01/25/2020-no evidence of recurrent disease, resolution of right lower lobe nodule Maintenance Xeloda  beginning 02/06/2020 CTs 04/25/2020- no evidence of metastatic disease, multiple bilateral renal calculi without hydronephrosis Maintenance Xeloda  continued CT abdomen/pelvis without contrast 08/10/2020-bilateral staghorn renal calculi, no evidence of metastatic  disease; addendum 08/23/2020-small but increasing omental nodules. Cycle 1 FOLFIRI/panitumumab  09/19/2020 Cycle 2 FOLFIRI/Panitumumab  10/03/2020 Cycle 3 FOLFIRI/Panitumumab  10/17/2020 Cycle 4 FOLFIRI/panitumumab  10/31/2020 Cycle 5 FOLFIRI/Panitumumab  11/14/2020 CTs 11/26/2020-stable omental metastases compared to 10/22/2020, mildly decreased from 08/10/2020.  Left lower quadrant tiny paracolic gutter  implant slightly decreased from CT 08/10/2020.  No new or progressive metastatic disease in the abdomen or pelvis. Cycle 6 FOLFIRI/Panitumumab  11/28/2020, irinotecan  dose reduced, treatment schedule adjusted to every 3 weeks going forward Cycle 7 FOLFIRI/panitumumab  12/24/2020 Cycle 8 FOLFIRI/Panitumumab  01/16/2021--treatment held due to hypertension in the infusion area. Cycle 8 FOLFIRI/panitumumab  01/21/2021 Cycle 9 FOLFIRI/panitumumab  02/11/2021 Cycle 10 FOLFIRI/Panitumumab  03/04/2021 Cycle 11 FOLFIRI/Panitumumab  03/25/2021 CT abdomen/pelvis 04/10/2021-stable right omental and left posterior paracolic gutter nodules Cycle 12 FOLFIRI/panitumumab  04/15/2021 Cycle 13 FOLFIRI/panitumumab  05/06/2021 Cycle 14 FOLFIRI/Panitumumab  05/27/2021 Cycle 15 FOLFIRI/Panitumumab  06/17/2021 CT abdomen/pelvis 07/05/2021-slight enlargement of a dominant right omental implant, no new implants Patient requested a treatment break CT abdomen/pelvis 10/04/2021-mild increase in size of multiple omental soft tissue nodules, 4 mm calculus in the distal left ureter adjacent to the ureteral stent Cycle 1 Lonsurf  10/14/2021 10/28/2021 Avastin  every 2 weeks Cycle 2 Lonsurf  11/11/2021 Cycle 3 Lonsurf  12/09/2021 Cycle 4 Lonsurf  01/06/2022 Avastin  held 01/20/2022 due to proteinuria, 24-hour urine 43 mg CT/pelvis 01/30/2022-possible new peritoneal implant at the transverse colon, no significant change in other omental/peritoneal implants, stable chronic rectal wall thickening, status post removal of double-J left ureteral stent with no evidence of  hydronephrosis or ureteral calculus, nonobstructing bilateral renal calculi Cycle 5 Lonsurf  02/05/2022 or 02/06/2022 Avastin  every 2 weeks Cycle 6 Lonsurf  03/03/2022 CTs 04/05/2022-increase in size and number of peritoneal implants compared to January 2024 central liver mass narrowing the anterior branch of the right portal vein, decreased biliary duct dilation, biliary stents in place, stable right cardiophrenic lymph nodes, separate pigtail stent in the lumen of the proximal duodenum Cycle 1 FOLFOX 04/14/2022 CT abdomen/pelvis 04/16/2022-stent within the third portion of the duodenum, stable peritoneal nodules, indwelling biliary stents with mild left biliary duct dilation Cycle 2 FOLFOX 04/28/2022, Emend and prophylactic dexamethasone  added Cycle 3 FOLFOX 05/12/2022, Aloxi , Emend, prophylactic dexamethasone , Compazine  and lorazepam  as needed Chemotherapy held 05/25/2020 due to severe hypertension, evaluated in the emergency department Cycle 4 FOLFOX 06/03/2022 Cycle 5 FOLFOX 06/16/2022, oxaliplatin  dose reduced and 5-FU bolus eliminated due to neutropenia CT 06/25/2022-unchanged soft tissue at the hepatic hilum, unchanged, bile duct stent, decreased size of peritoneal and omental nodules and diminished volume of likely fluid in the lower left pelvis Cycle 6 FOLFOX 06/30/2022 Cycle 7 FOLFOX 07/21/2022 CT 07/24/2022 (in the emergency department with nausea, headache, abdominal pain)-no acute pathology.  No significant interval change in omental nodularity. Cycle 8 FOLFOX 08/04/2022, pruritus on the palms at the end of the oxaliplatin  infusion, Pepcid  and Benadryl  given, symptoms resolved, infusion not resumed Cycle 9 FOLFOX 08/18/2022-Pepcid  and Benadryl  added to premedications, Oxaliplatin  diluted in a larger volume and infusion time increased Cycle 10 FOLFOX 09/08/2022 Cycle 11 FOLFOX 09/22/2022 10/06/2022 patient declined treatment CTs 10/15/2022-similar soft tissue fullness in the hepatic hilum with biliary stents in  place, decrease in omental/peritoneal metastases Maintenance capecitabine  10/26/2022 CT abdomen/pelvis 11/19/2022-mild increase in tumor studding at the left paracolic gutter and omentum, circumferential wall thickening of the rectum, biliary stents in place with minimal increase in intrahepatic biliary dilation, nonobstructive renal calculi Colonoscopy 11/23/2022-anastomosis at 15 cm from the anal verge-patent, 2 cm polypoid tissue at the anastomosis biopsied, invasive adenocarcinoma moderately differentiated, preserved expression of major MMR proteins CTs 12/15/2022: Possible developing nodule in the lateral left liver, increased omental nodularity, persistent wall thickening in the upper rectum CT abdomen/pelvis 12/24/2022: Increased thickened fold in the transverse colon-infectious/inflammatory, 2.1 cm lateral left liver lesion, potential new 5 mm segment 5 lesion, stable biliary stents with dramatic biliary dilation, central biliary wall thickening  near the hepatic hilum, multiple peritoneal/omental implants unchanged from 12/15/2022, but likely larger than 11/19/2022 Cycle 1 irinotecan /Panitumumab  01/06/2023 Cycle 2 irinotecan /Panitumumab  01/20/2023   Hypertension G4 P3, twins Kidney stones-bilateral staghorn renal calculi on CT 08/10/2020 Infected Port-A-Cath 09/12/2019-placed on Augmentin , referred for Port-A-Cath removal; Port-A-Cath removed 09/14/2019; PICC line placed 09/14/2019; culture staph aureus.  Course of Septra  completed. Neutropenia secondary to chemotherapy-Udenyca  added with cycle 8 FOLFIRI Right nephrostomy tube 09/12/2020; stent placed 10/09/2020, percutaneous nephrostolithotomy treatment of right-sided kidney stones 10/09/2020, malpositioned right ureter stent replacement 10/23/2020, right ureter stent removed 10/29/2020 Percutaneous left nephrostolithotomy 10/01/2021-no renal stone identified, left ureter stent left in place Cystoscopy 10/14/2021-stent removed 8.  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  Admission 04/16/2022 with nausea/vomiting and abdominal pain, felt to be acute toxicity related to chemotherapy, discharged home 04/18/2022 10.  Admission 11/19/2022 with nausea/vomiting and abdominal pain-improved 11.  Admission 12/15/2022 with fever and right submandibular swelling/tenderness CT neck 12/15/2022: 2 mm distal right submandibular duct calculus with mild inflammatory change adjacent to the right submandibular gland 12.  Admission 12/24/2022 with a fever nausea/vomiting, diarrhea, Klebsiella bacteremia completed course of Augmentin     Disposition: Ms. Sterba appears stable.  She has completed 1 cycle of irinotecan /Panitumumab .  She seems to have tolerated well.  Plan to proceed with cycle 2 today as scheduled.  CBC and chemistry panel reviewed.  Labs adequate to proceed as above.  Plan urinalysis and culture today to evaluate complaint of incomplete bladder emptying.  She will return for follow-up and treatment in 2 weeks.    April [name] ANP/GNP-BC   01/20/2023  8:17 AM

## 2023-01-21 ENCOUNTER — Other Ambulatory Visit (HOSPITAL_BASED_OUTPATIENT_CLINIC_OR_DEPARTMENT_OTHER): Payer: Self-pay

## 2023-01-21 ENCOUNTER — Telehealth: Payer: Self-pay | Admitting: *Deleted

## 2023-01-21 LAB — URINE CULTURE

## 2023-01-21 MED ORDER — PROCHLORPERAZINE MALEATE 10 MG PO TABS: 10.0000 mg | ORAL_TABLET | Freq: Four times a day (QID) | ORAL | 1 refills | Status: DC | PRN

## 2023-01-21 NOTE — Telephone Encounter (Signed)
 Refilled compazine, but unsure as to why she is on decadron. Message sent to MD to advise.

## 2023-01-21 NOTE — Telephone Encounter (Signed)
 Called for RF on dexamethasone and prochlorperazine to [facility] on [address]

## 2023-01-24 ENCOUNTER — Emergency Department (HOSPITAL_COMMUNITY): Payer: 59

## 2023-01-24 ENCOUNTER — Encounter (HOSPITAL_COMMUNITY): Payer: Self-pay

## 2023-01-24 ENCOUNTER — Other Ambulatory Visit: Payer: Self-pay

## 2023-01-24 ENCOUNTER — Inpatient Hospital Stay (HOSPITAL_COMMUNITY)
Admission: EM | Admit: 2023-01-24 | Discharge: 2023-01-27 | DRG: 641 | Disposition: A | Discharge status: Home / Self Care | Payer: 59 | Attending: Internal Medicine | Admitting: Internal Medicine

## 2023-01-24 DIAGNOSIS — C19 Malignant neoplasm of rectosigmoid junction: Secondary | ICD-10-CM

## 2023-01-24 DIAGNOSIS — I7121 Aneurysm of the ascending aorta, without rupture: Secondary | ICD-10-CM | POA: Diagnosis present

## 2023-01-24 DIAGNOSIS — C787 Secondary malignant neoplasm of liver and intrahepatic bile duct: Secondary | ICD-10-CM | POA: Diagnosis present

## 2023-01-24 DIAGNOSIS — Z8619 Personal history of other infectious and parasitic diseases: Secondary | ICD-10-CM

## 2023-01-24 DIAGNOSIS — K529 Noninfective gastroenteritis and colitis, unspecified: Secondary | ICD-10-CM | POA: Diagnosis present

## 2023-01-24 DIAGNOSIS — R748 Abnormal levels of other serum enzymes: Secondary | ICD-10-CM | POA: Diagnosis present

## 2023-01-24 DIAGNOSIS — Z87442 Personal history of urinary calculi: Secondary | ICD-10-CM

## 2023-01-24 DIAGNOSIS — Z9889 Other specified postprocedural states: Secondary | ICD-10-CM

## 2023-01-24 DIAGNOSIS — Z87891 Personal history of nicotine dependence: Secondary | ICD-10-CM

## 2023-01-24 DIAGNOSIS — R1084 Generalized abdominal pain: Secondary | ICD-10-CM

## 2023-01-24 DIAGNOSIS — Z79899 Other long term (current) drug therapy: Secondary | ICD-10-CM

## 2023-01-24 DIAGNOSIS — E876 Hypokalemia: Principal | ICD-10-CM | POA: Diagnosis present

## 2023-01-24 DIAGNOSIS — R1110 Vomiting, unspecified: Secondary | ICD-10-CM | POA: Diagnosis present

## 2023-01-24 DIAGNOSIS — I10 Essential (primary) hypertension: Secondary | ICD-10-CM | POA: Diagnosis present

## 2023-01-24 DIAGNOSIS — Z833 Family history of diabetes mellitus: Secondary | ICD-10-CM

## 2023-01-24 DIAGNOSIS — R112 Nausea with vomiting, unspecified: Secondary | ICD-10-CM | POA: Diagnosis present

## 2023-01-24 DIAGNOSIS — J439 Emphysema, unspecified: Secondary | ICD-10-CM | POA: Diagnosis present

## 2023-01-24 DIAGNOSIS — Z98891 History of uterine scar from previous surgery: Secondary | ICD-10-CM

## 2023-01-24 DIAGNOSIS — Z888 Allergy status to other drugs, medicaments and biological substances status: Secondary | ICD-10-CM

## 2023-01-24 DIAGNOSIS — Z7951 Long term (current) use of inhaled steroids: Secondary | ICD-10-CM

## 2023-01-24 DIAGNOSIS — Z7960 Long term (current) use of unspecified immunomodulators and immunosuppressants: Secondary | ICD-10-CM

## 2023-01-24 DIAGNOSIS — C189 Malignant neoplasm of colon, unspecified: Secondary | ICD-10-CM | POA: Diagnosis present

## 2023-01-24 DIAGNOSIS — Z8249 Family history of ischemic heart disease and other diseases of the circulatory system: Secondary | ICD-10-CM

## 2023-01-24 DIAGNOSIS — N200 Calculus of kidney: Secondary | ICD-10-CM | POA: Diagnosis present

## 2023-01-24 DIAGNOSIS — K838 Other specified diseases of biliary tract: Secondary | ICD-10-CM | POA: Diagnosis present

## 2023-01-24 DIAGNOSIS — Z823 Family history of stroke: Secondary | ICD-10-CM

## 2023-01-24 DIAGNOSIS — E8809 Other disorders of plasma-protein metabolism, not elsewhere classified: Secondary | ICD-10-CM | POA: Diagnosis present

## 2023-01-24 DIAGNOSIS — Z85048 Personal history of other malignant neoplasm of rectum, rectosigmoid junction, and anus: Secondary | ICD-10-CM

## 2023-01-24 DIAGNOSIS — E871 Hypo-osmolality and hyponatremia: Secondary | ICD-10-CM | POA: Diagnosis present

## 2023-01-24 DIAGNOSIS — K5909 Other constipation: Secondary | ICD-10-CM | POA: Diagnosis present

## 2023-01-24 LAB — COMPREHENSIVE METABOLIC PANEL
ALT: 21 U/L (ref 0–44)
AST: 24 U/L (ref 15–41)
Albumin: 3.7 g/dL (ref 3.5–5.0)
Alkaline Phosphatase: 408 U/L — ABNORMAL HIGH (ref 38–126)
Anion gap: 9 (ref 5–15)
BUN: 15 mg/dL (ref 6–20)
CO2: 30 mmol/L (ref 22–32)
Calcium: 8.9 mg/dL (ref 8.9–10.3)
Chloride: 93 mmol/L — ABNORMAL LOW (ref 98–111)
Creatinine, Ser: 0.46 mg/dL (ref 0.44–1.00)
GFR, Estimated: >60 mL/min (ref >60)
Glucose, Bld: 120 mg/dL — ABNORMAL HIGH (ref 70–99)
Potassium: 2.5 mmol/L — CL (ref 3.5–5.1)
Sodium: 132 mmol/L — ABNORMAL LOW (ref 135–145)
Total Bilirubin: 0.4 mg/dL (ref 0.0–1.2)
Total Protein: 8.2 g/dL — ABNORMAL HIGH (ref 6.5–8.1)

## 2023-01-24 LAB — URINALYSIS, ROUTINE W REFLEX MICROSCOPIC
Bacteria, UA: NONE SEEN
Bilirubin Urine: NEGATIVE
Glucose, UA: NEGATIVE mg/dL
Ketones, ur: NEGATIVE mg/dL
Leukocytes,Ua: NEGATIVE
Nitrite: NEGATIVE
Protein, ur: 100 mg/dL — AB
RBC / HPF: >50 RBC/hpf (ref 0–5)
Specific Gravity, Urine: 1.032 — ABNORMAL HIGH (ref 1.005–1.030)
pH: 5 (ref 5.0–8.0)

## 2023-01-24 LAB — CBC WITH DIFFERENTIAL/PLATELET
Abs Immature Granulocytes: 0.03 10*3/uL (ref 0.00–0.07)
Basophils Absolute: 0.1 10*3/uL (ref 0.0–0.1)
Basophils Relative: 1 %
Eosinophils Absolute: 0.3 10*3/uL (ref 0.0–0.5)
Eosinophils Relative: 3 %
HCT: 36.7 % (ref 36.0–46.0)
Hemoglobin: 12.3 g/dL (ref 12.0–15.0)
Immature Granulocytes: 0 %
Lymphocytes Relative: 43 %
Lymphs Abs: 3.3 10*3/uL (ref 0.7–4.0)
MCH: 30.4 pg (ref 26.0–34.0)
MCHC: 33.5 g/dL (ref 30.0–36.0)
MCV: 90.6 fL (ref 80.0–100.0)
Monocytes Absolute: 0.6 10*3/uL (ref 0.1–1.0)
Monocytes Relative: 7 %
Neutro Abs: 3.5 10*3/uL (ref 1.7–7.7)
Neutrophils Relative %: 46 %
Platelets: 356 10*3/uL (ref 150–400)
RBC: 4.05 MIL/uL (ref 3.87–5.11)
RDW: 13.6 % (ref 11.5–15.5)
WBC: 7.7 10*3/uL (ref 4.0–10.5)
nRBC: 0 % (ref 0.0–0.2)

## 2023-01-24 LAB — MAGNESIUM: Magnesium: 1.8 mg/dL (ref 1.7–2.4)

## 2023-01-24 LAB — TROPONIN I (HIGH SENSITIVITY)
Troponin I (High Sensitivity): 8 ng/L (ref <18)
Troponin I (High Sensitivity): 9 ng/L (ref <18)

## 2023-01-24 MED ORDER — IOHEXOL 300 MG/ML  SOLN
100.0000 mL | Freq: Once | INTRAMUSCULAR | Status: AC | PRN
  Administered 2023-01-24: 100 mL via INTRAVENOUS

## 2023-01-24 MED ORDER — POTASSIUM CHLORIDE 10 MEQ/100ML IV SOLN
INTRAVENOUS | Status: AC
  Administered 2023-01-24: 10 meq via INTRAVENOUS
  Filled 2023-01-24: qty 100

## 2023-01-24 MED ORDER — METOPROLOL TARTRATE 5 MG/5ML IV SOLN
5.0000 mg | Freq: Once | INTRAVENOUS | Status: AC
  Administered 2023-01-24: 5 mg via INTRAVENOUS
  Filled 2023-01-24: qty 5

## 2023-01-24 MED ORDER — ONDANSETRON HCL 4 MG/2ML IJ SOLN
4.0000 mg | Freq: Once | INTRAMUSCULAR | Status: AC
Start: 2023-01-24 — End: 2023-01-24
  Administered 2023-01-24: 4 mg via INTRAVENOUS
  Filled 2023-01-24: qty 2

## 2023-01-24 MED ORDER — ACETAMINOPHEN 325 MG PO TABS
650.0000 mg | ORAL_TABLET | Freq: Four times a day (QID) | ORAL | Status: DC | PRN
  Administered 2023-01-25 – 2023-01-27 (×6): 650 mg via ORAL
  Filled 2023-01-24 (×6): qty 2

## 2023-01-24 MED ORDER — HYDROMORPHONE HCL 1 MG/ML IJ SOLN: 1.0000 mg | INTRAMUSCULAR | Status: DC | PRN

## 2023-01-24 MED ORDER — PANTOPRAZOLE SODIUM 40 MG IV SOLR
40.0000 mg | Freq: Once | INTRAVENOUS | Status: AC
  Administered 2023-01-24: 40 mg via INTRAVENOUS
  Filled 2023-01-24: qty 10

## 2023-01-24 MED ORDER — SODIUM CHLORIDE 0.9 % IV SOLN
25.0000 mg | Freq: Once | INTRAVENOUS | Status: AC
  Administered 2023-01-24: 25 mg via INTRAVENOUS
  Filled 2023-01-24: qty 25

## 2023-01-24 MED ORDER — PROCHLORPERAZINE EDISYLATE 10 MG/2ML IJ SOLN: 10.0000 mg | Freq: Four times a day (QID) | INTRAMUSCULAR | Status: DC | PRN

## 2023-01-24 MED ORDER — POTASSIUM CHLORIDE 10 MEQ/100ML IV SOLN
10.0000 meq | INTRAVENOUS | Status: AC
  Administered 2023-01-24 (×3): 10 meq via INTRAVENOUS
  Filled 2023-01-24 (×4): qty 100

## 2023-01-24 MED ORDER — ONDANSETRON HCL 4 MG/2ML IJ SOLN
4.0000 mg | Freq: Four times a day (QID) | INTRAMUSCULAR | Status: DC | PRN
  Administered 2023-01-24 – 2023-01-25 (×2): 4 mg via INTRAVENOUS
  Filled 2023-01-24 (×3): qty 2

## 2023-01-24 MED ORDER — KCL IN DEXTROSE-NACL 20-5-0.9 MEQ/L-%-% IV SOLN
INTRAVENOUS | Status: AC
  Administered 2023-01-24 – 2023-01-25 (×2): 125 mL/h via INTRAVENOUS
  Filled 2023-01-24 (×4): qty 1000

## 2023-01-24 MED ORDER — LACTATED RINGERS IV BOLUS
1000.0000 mL | Freq: Once | INTRAVENOUS | Status: AC
  Administered 2023-01-24: 1000 mL via INTRAVENOUS

## 2023-01-24 MED ORDER — SODIUM CHLORIDE 0.9 % IV BOLUS
1000.0000 mL | Freq: Once | INTRAVENOUS | Status: AC
  Administered 2023-01-24: 1000 mL via INTRAVENOUS

## 2023-01-24 MED ORDER — FENTANYL CITRATE PF 50 MCG/ML IJ SOSY
50.0000 ug | PREFILLED_SYRINGE | Freq: Once | INTRAMUSCULAR | Status: AC
  Administered 2023-01-24: 50 ug via INTRAVENOUS
  Filled 2023-01-24: qty 1

## 2023-01-24 MED ORDER — PROCHLORPERAZINE EDISYLATE 10 MG/2ML IJ SOLN
10.0000 mg | Freq: Once | INTRAMUSCULAR | Status: AC
  Administered 2023-01-24: 10 mg via INTRAVENOUS
  Filled 2023-01-24: qty 2

## 2023-01-24 MED ORDER — ACETAMINOPHEN 650 MG RE SUPP: 650.0000 mg | Freq: Four times a day (QID) | RECTAL | Status: DC | PRN

## 2023-01-24 MED ORDER — DIPHENHYDRAMINE HCL 50 MG/ML IJ SOLN
25.0000 mg | Freq: Once | INTRAMUSCULAR | Status: AC
  Administered 2023-01-24: 25 mg via INTRAVENOUS
  Filled 2023-01-24: qty 1

## 2023-01-24 MED ORDER — MAGNESIUM SULFATE 2 GM/50ML IV SOLN
2.0000 g | Freq: Once | INTRAVENOUS | Status: AC
  Administered 2023-01-24: 2 g via INTRAVENOUS
  Filled 2023-01-24: qty 50

## 2023-01-24 MED ORDER — HYDROMORPHONE HCL 1 MG/ML IJ SOLN
1.0000 mg | INTRAMUSCULAR | Status: DC | PRN
  Administered 2023-01-24 – 2023-01-27 (×17): 1 mg via INTRAVENOUS
  Filled 2023-01-24 (×17): qty 1

## 2023-01-24 MED ORDER — ENOXAPARIN SODIUM 40 MG/0.4ML IJ SOSY
40.0000 mg | PREFILLED_SYRINGE | INTRAMUSCULAR | Status: DC
  Administered 2023-01-24 – 2023-01-26 (×3): 40 mg via SUBCUTANEOUS
  Filled 2023-01-24 (×2): qty 0.4

## 2023-01-24 NOTE — ED Notes (Signed)
 Pt back from CT scan, resuming IV administration

## 2023-01-24 NOTE — ED Triage Notes (Signed)
 PT BIBA for nausea/vomiting and upper abdominal pain since cancer treatment on Tuesday 01/19/22   Pt endorses urinary symptoms, difficulty urinating at times  Pt denies diarrhea  Pt is A&O x 4 and pt is in no obvious distress at this time

## 2023-01-24 NOTE — ED Notes (Signed)
 Ice chips given to pt.

## 2023-01-24 NOTE — H&P (Signed)
 History and Physical    Patient: April [name] FMW:[id number] DOB: Aug 07, 1975 DOA: 01/24/2023 DOS: the patient was seen and examined on 01/24/2023 PCP: Oris Camie BRAVO, NP  Patient coming from: Home  Chief Complaint:  Chief Complaint  Patient presents with   Weakness    nausea   HPI: April [name] is a 48 y.o. female with medical history significant of essential hypertension, nephrolithiasis, hydronephrosis, cellulitis, undetermined organism sepsis, prolonged QT interval, ascending aortic aneurysm, history of biliary obstruction, diverticulosis, chronic constipation, stage IV colon cancer who had chemotherapy 4 days ago developing next day nausea, 6-8 episodes of emesis daily, fatigue and malaise.  She has epigastric tenderness and is complaining of supragastric burning sensation from vomiting.   No diarrhea, constipation, melena or hematochezia.  No flank pain, dysuria, frequency or hematuria. She denied fever, chills, rhinorrhea, sore throat, wheezing or hemoptysis.  No chest pain, palpitations, diaphoresis, PND, orthopnea or pitting edema of the lower extremities. No polyuria, polydipsia, polyphagia or blurred vision.   Lab work: CBC and troponin were normal.  Magnesium  was 1.8 mg/dL.  Sodium 132, potassium 2.5, chloride 93 and CO2 30 mmol/L with a normal anion gap.  Glucose 120 mg/dL, total protein 8.2 g/dL and alkaline phosphatase 408 units/L.  Calcium , renal function and the rest of the LFTs were normal.  Imaging: CT chest/abdomen/pelvis with contrast showing no acute intrathoracic findings or evidence of metastatic disease in the chest.  There is metastatic rectal cancer in the abdomen with slight increase in omental nodularity over the past month.  The previously seen left lobe liver lesion is not definitely seen on prior exam.  Biliary stents remain in place with stable intrahepatic biliary ductal dilatation.  Bilateral nonobstructing intrarenal calculi.  Narrowing  of the left renal vein as it courses under the SMA with the retroperitoneum.  Hilar collaterals.  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  Wall thickening of the distal stomach also seen on prior exam.  This may represent gastritis or PUD.  Wall thickening versus nondistention of the transverse colon.  Aortic atherosclerosis.  Please see images and full radiology report for further details.   ED course: Initial vital signs were temperature 98.3 F, pulse 98, respiration 22, BP 131/110 mmHg O2 sat 100% on room air.  The patient received 1000 mL normal saline bolus, KCl 10 mEq IVPB x 4, prochlorperazine  10 mg IVP, ondansetron  4 mg IVP, fentanyl  50 mcg IVP and diphenhydramine  25 mg IVP.  Review of Systems: As mentioned in the history of present illness. All other systems reviewed and are negative. Past Medical History:  Diagnosis Date   Benign essential hypertension 10/06/2017   Last Assessment & Plan:  Formatting of this note might be different from the original. Patient's blood pressure noted to be elevated at today's visit 188/98.  Patient will be provided with antihypertensives in the treatment center if needed in order to meet her Avastin  parameters.  Patient recommended to follow up with her primary care physician regarding medication management.  Patient is not cur   Cancer [facility])    Change in bowel habits 06/28/2022   Colon cancer (HCC)    History of colon cancer 06/28/2022   Hypertension    Renal calculi    bilateral   Sepsis due to undetermined organism (HCC) 12/24/2022   Sialoadenitis of submandibular gland 12/16/2022   Past Surgical History:  Procedure Laterality Date   BILIARY BRUSHING  03/24/2022   Procedure: BILIARY BRUSHING;  Surgeon: Wilhelmenia,  Aloha Raddle., MD;  Location: [facility] ENDOSCOPY;  Service: Gastroenterology;;   BILIARY DILATION  03/24/2022   Procedure: BILIARY DILATION;  Surgeon: Wilhelmenia Aloha Raddle., MD;  Location: [facility] ENDOSCOPY;   Service: Gastroenterology;;   BILIARY STENT PLACEMENT  03/24/2022   Procedure: BILIARY STENT PLACEMENT;  Surgeon: Wilhelmenia Aloha Raddle., MD;  Location: [facility] ENDOSCOPY;  Service: Gastroenterology;;   BIOPSY  03/24/2022   Procedure: BIOPSY;  Surgeon: Wilhelmenia Aloha Raddle., MD;  Location: [facility] ENDOSCOPY;  Service: Gastroenterology;;   BIOPSY  11/23/2022   Procedure: BIOPSY;  Surgeon: Abran Norleen SAILOR, MD;  Location: [facility] ENDOSCOPY;  Service: Gastroenterology;;   CESAREAN SECTION     x2   COLONOSCOPY N/A 11/23/2022   Procedure: COLONOSCOPY;  Surgeon: Abran Norleen SAILOR, MD;  Location: [facility] ENDOSCOPY;  Service: Gastroenterology;  Laterality: N/A;   CYSTOSCOPY WITH RETROGRADE PYELOGRAM, URETEROSCOPY AND STENT PLACEMENT Right 10/22/2020   Procedure: CYSTOSCOPY WITH RETROGRADE PYELOGRAM, URETEROSCOPY AND STENT PLACEMENT;  Surgeon: Elisabeth Valli BIRCH, MD;  Location: [facility] ORS;  Service: Urology;  Laterality: Right;   ERCP N/A 03/24/2022   Procedure: ENDOSCOPIC RETROGRADE CHOLANGIOPANCREATOGRAPHY (ERCP);  Surgeon: Wilhelmenia Aloha Raddle., MD;  Location: [facility] ENDOSCOPY;  Service: Gastroenterology;  Laterality: N/A;   IR PATIENT EVAL TECH 0-60 MINS  12/15/2022   IR RADIOLOGIST EVAL & MGMT  09/16/2019   IR RADIOLOGIST EVAL & MGMT  09/23/2019   IR RADIOLOGIST EVAL & MGMT  09/20/2019   IR RADIOLOGIST EVAL & MGMT  09/30/2019   IR REMOVAL TUN ACCESS W/ PORT W/O FL MOD SED  09/14/2019   IR VENO/EXT/UNI RIGHT  04/11/2022   PANCREATIC STENT PLACEMENT  03/24/2022   Procedure: PANCREATIC STENT PLACEMENT;  Surgeon: Wilhelmenia Aloha Raddle., MD;  Location: [facility] ENDOSCOPY;  Service: Gastroenterology;;   REMOVAL OF STONES  03/24/2022   Procedure: REMOVAL OF STONES;  Surgeon: Wilhelmenia Aloha Raddle., MD;  Location: [facility] ENDOSCOPY;  Service: Gastroenterology;;   ANNETT  03/24/2022   Procedure: ANNETT;  Surgeon: Wilhelmenia Aloha Raddle., MD;  Location: [facility] ENDOSCOPY;  Service: Gastroenterology;;   URETERAL STENT PLACEMENT     Social  History:  reports that she has quit smoking. Her smoking use included cigarettes. She has never used smokeless tobacco. She reports that she does not currently use alcohol . She reports that she does not currently use drugs after having used the following drugs: Marijuana.  Allergies  Allergen Reactions   Lisinopril Swelling and Other (See Comments)    Lip swelling and Angioedema   Irinotecan  Other (See Comments)    Stomach cramping Patient given 0.5 mL atropine  IV and was able to continue infusion.  See Progress note on 10/03/20.    Leucovorin  Calcium  Itching    Patient reported itchy palms towards end of transfusion, see progress note 08/04/22.    Oxaliplatin  Itching    Patient reported itchy palms towards end of transfusion, see progress note on 08/04/22.    Family History  Problem Relation Age of Onset   Hypertension Mother    Diabetes Mellitus II Father    Stroke Father    Ulcers Sister    Colon cancer Neg Hx    Esophageal cancer Neg Hx    Inflammatory bowel disease Neg Hx    Liver disease Neg Hx    Pancreatic cancer Neg Hx    Rectal cancer Neg Hx    Stomach cancer Neg Hx     Prior to Admission medications   Medication Sig Start Date End Date Taking? Authorizing Provider  amLODipine  (NORVASC ) 10 MG tablet  Take 1 tablet (10 mg total) by mouth daily. 11/24/22   Sebastian Toribio GAILS, MD  chlorthalidone  (HYGROTON ) 25 MG tablet Take 1 tablet (25 mg total) by mouth daily. Patient taking differently: Take 37.5 mg by mouth every other day. 12/30/22   Early, Sara E, NP  cloNIDine  (CATAPRES ) 0.1 MG tablet Take 1 tablet (0.1 mg total) by mouth 3 (three) times daily. 12/30/22   Early, Sara E, NP  dicyclomine  (BENTYL ) 20 MG tablet Take 1 tablet (20 mg total) by mouth 3 (three) times daily as needed for spasms (rectal/AB pain). 01/20/23   Thomas, Lisa K, NP  HYDROcodone -acetaminophen  (NORCO) 10-325 MG tablet Take 1 tablet by mouth every 4 (four) hours as needed for moderate pain (pain score 4-6)  or severe pain (pain score 7-10). 01/15/23   Debby Olam POUR, NP  lactulose  (CEPHULAC ) 10 g packet Take 10 g by mouth 3 (three) times daily. Patient not taking: Reported on 01/20/2023    [provider]  linaclotide  (LINZESS ) 72 MCG capsule Take 1 capsule (72 mcg total) by mouth daily before breakfast for 8 days. 12/31/22 01/08/23  Mansouraty, Aloha Raddle., MD  metoprolol  tartrate (LOPRESSOR ) 50 MG tablet Take 1 tablet (50 mg total) by mouth 2 (two) times daily. 11/24/22   Sebastian Toribio GAILS, MD  mometasone -formoterol  (DULERA ) 200-5 MCG/ACT AERO Inhale 2 puffs into the lungs 2 (two) times daily. Patient not taking: Reported on 01/20/2023 11/24/22   Sebastian Toribio GAILS, MD  morphine  (MS CONTIN ) 30 MG 12 hr tablet Take 1 tablet (30 mg total) by mouth every 12 (twelve) hours. 01/15/23   Thomas, Lisa K, NP  polyethylene glycol powder (MIRALAX ) 17 GM/SCOOP powder Take 17 g by mouth 2 (two) times daily. 11/24/22   Sebastian Toribio GAILS, MD  potassium chloride  (MICRO-K ) 10 MEQ CR capsule Take 2 capsules (20 mEq total) by mouth 2 (two) times daily. 11/24/22   Sebastian Toribio GAILS, MD  prochlorperazine  (COMPAZINE ) 10 MG tablet Take 1 tablet (10 mg total) by mouth every 6 (six) hours as needed for nausea or vomiting. 01/21/23   Cloretta Arley NOVAK, MD  umeclidinium bromide  (INCRUSE ELLIPTA ) 62.5 MCG/ACT AEPB Inhale 1 puff into the lungs daily. Patient not taking: Reported on 01/20/2023 11/25/22   Sebastian Toribio GAILS, MD    Physical Exam: Vitals:   01/24/23 1408 01/24/23 1416 01/24/23 1500  BP: (!) 131/110  (!) 119/92  Pulse: 98  94  Resp:   (!) 22  Temp: 98.3 F (36.8 C)    TempSrc: Oral    SpO2: 100% 100% 100%   Physical Exam Vitals and nursing note reviewed.  Constitutional:      General: She is awake. She is not in acute distress.    Appearance: Normal appearance. She is ill-appearing.  HENT:     Head: Normocephalic.     Nose: No rhinorrhea.     Mouth/Throat:     Mouth: Mucous membranes are dry.  Eyes:      General: No scleral icterus.    Pupils: Pupils are equal, round, and reactive to light.  Neck:     Vascular: No JVD.  Cardiovascular:     Rate and Rhythm: Normal rate and regular rhythm.     Heart sounds: S1 normal and S2 normal.  Pulmonary:     Effort: Pulmonary effort is normal.     Breath sounds: Normal breath sounds.  Abdominal:     General: Bowel sounds are normal. There is no distension.     Palpations:  Abdomen is soft.     Tenderness: There is abdominal tenderness in the epigastric area. There is no right CVA tenderness, left CVA tenderness, guarding or rebound.  Musculoskeletal:     Cervical back: Neck supple.     Right lower leg: No edema.     Left lower leg: No edema.  Skin:    General: Skin is warm and dry.  Neurological:     General: No focal deficit present.     Mental Status: She is alert and oriented to person, place, and time.  Psychiatric:        Mood and Affect: Mood normal.        Behavior: Behavior normal. Behavior is cooperative.     Data Reviewed:  Results are pending, will review when available. EKG: Vent. rate 90 BPM PR interval 133 ms QRS duration 98 ms QT/QTcB 380/465 ms P-R-T axes 70 57 55 Sinus rhythm Left atrial enlargement Left ventricular hypertrophy Abnormal ECG  Assessment and Plan: Principal Problem:   Hypokalemia Secondary to:   Intractable nausea and vomiting Also on chronic HCTZ therapy. Observation/telemetry. Continue IV fluids. Keep n.p.o. for now. Advance to clear liquid diet as tolerated. Analgesics as needed. Antiemetics as needed. Pantoprazole  40 mg IVP x 1. Follow CBC, CMP in AM.  Active Problems:   Hyponatremia Secondary to GI losses and HCTZ use.    Hyperproteinemia Secondary to hemoconcentration. Continue IV fluids. Follow-up level in AM.    Metastatic colon cancer to liver [facility]) Follow-up at the cancer center as scheduled. May need to be premedicated prior to next chemotherapy.    Elevated  alkaline phosphatase level Secondary to liver mets.    Hypertension Has not taken HCTZ or metoprolol  since Tuesday. Hold HCTZ due to hyponatremia/hypokalemia.    Chronic constipation Resume Linzess  once tolerating oral intake. Lactulose  as needed once back on diet.    Emphysema/COPD (HCC) Bronchodilators as needed.    Advance Care Planning:   Code Status: Full Code   Consults:   Family Communication:   Severity of Illness: The appropriate patient status for this patient is OBSERVATION. Observation status is judged to be reasonable and necessary in order to provide the required intensity of service to ensure the patient's safety. The patient's presenting symptoms, physical exam findings, and initial radiographic and laboratory data in the context of their medical condition is felt to place them at decreased risk for further clinical deterioration. Furthermore, it is anticipated that the patient will be medically stable for discharge from the hospital within 2 midnights of admission.   Author: Alm Dorn Castor, MD 01/24/2023 5:12 PM  For on call review [email or web address].   This document was prepared using Dragon voice recognition software and may contain some unintended transcription errors.

## 2023-01-24 NOTE — ED Notes (Signed)
 Pt tolerated ice chips and reports improved nausea, provided pt ice pop

## 2023-01-24 NOTE — Plan of Care (Signed)
  Problem: Education: Goal: Knowledge of General Education information will improve Description: Including pain rating scale, medication(s)/side effects and non-pharmacologic comfort measures Outcome: Progressing   Problem: Health Behavior/Discharge Planning: Goal: Ability to manage health-related needs will improve Outcome: Progressing   Problem: Clinical Measurements: Goal: Ability to maintain clinical measurements within normal limits will improve Outcome: Progressing Goal: Will remain free from infection Outcome: Progressing Goal: Diagnostic test results will improve Outcome: Progressing Goal: Respiratory complications will improve Outcome: Progressing Goal: Cardiovascular complication will be avoided Outcome: Progressing   Problem: Nutrition: Goal: Adequate nutrition will be maintained Outcome: Progressing   Problem: Coping: Goal: Level of anxiety will decrease Outcome: Progressing   Problem: Elimination: Goal: Will not experience complications related to bowel motility Outcome: Progressing Goal: Will not experience complications related to urinary retention Outcome: Progressing   Problem: Elimination: Goal: Will not experience complications related to urinary retention Outcome: Progressing   Problem: Pain Management: Goal: General experience of comfort will improve Outcome: Progressing

## 2023-01-24 NOTE — ED Provider Notes (Signed)
 [place] [facility] DEPARTMENT AT [facility] Provider Note   CSN: [id number] Arrival date & time: 01/24/23  1352     History  Chief Complaint  Patient presents with   Weakness    nausea    April [name] is a 48 y.o. female, history of rectal cancer, with metastasis, to the liver, who presents to the ED secondary to severe abdominal pain, rating to the chest, this been going on for the last 3 days, after she did her treatment, on Tuesday.  She states she has had intractable nausea, vomiting, and chest pain radiating to the chest since then.  Pain is persistent, and she cannot keep anything down.  Denies any fevers, but does endorse severe chills, and is having solid bowel movements.  Is not having any diarrhea, or blood in stools.  Denies any shortness of breath.  Is on round 2 of irinotecan /panitumumab ; sees Dr. Cloretta.     Home Medications Prior to Admission medications   Medication Sig Start Date End Date Taking? Authorizing Provider  amLODipine  (NORVASC ) 10 MG tablet Take 1 tablet (10 mg total) by mouth daily. 11/24/22   Sebastian Toribio GAILS, MD  chlorthalidone  (HYGROTON ) 25 MG tablet Take 1 tablet (25 mg total) by mouth daily. Patient taking differently: Take 37.5 mg by mouth every other day. 12/30/22   Early, Sara E, NP  cloNIDine  (CATAPRES ) 0.1 MG tablet Take 1 tablet (0.1 mg total) by mouth 3 (three) times daily. 12/30/22   Early, Sara E, NP  dicyclomine  (BENTYL ) 20 MG tablet Take 1 tablet (20 mg total) by mouth 3 (three) times daily as needed for spasms (rectal/AB pain). 01/20/23   Thomas, Lisa K, NP  HYDROcodone -acetaminophen  (NORCO) 10-325 MG tablet Take 1 tablet by mouth every 4 (four) hours as needed for moderate pain (pain score 4-6) or severe pain (pain score 7-10). 01/15/23   Debby Olam POUR, NP  lactulose  (CEPHULAC ) 10 g packet Take 10 g by mouth 3 (three) times daily. Patient not taking: Reported on 01/20/2023    [provider]   linaclotide  (LINZESS ) 72 MCG capsule Take 1 capsule (72 mcg total) by mouth daily before breakfast for 8 days. 12/31/22 01/08/23  Mansouraty, Aloha Raddle., MD  metoprolol  tartrate (LOPRESSOR ) 50 MG tablet Take 1 tablet (50 mg total) by mouth 2 (two) times daily. 11/24/22   Sebastian Toribio GAILS, MD  mometasone -formoterol  (DULERA ) 200-5 MCG/ACT AERO Inhale 2 puffs into the lungs 2 (two) times daily. Patient not taking: Reported on 01/20/2023 11/24/22   Sebastian Toribio GAILS, MD  morphine  (MS CONTIN ) 30 MG 12 hr tablet Take 1 tablet (30 mg total) by mouth every 12 (twelve) hours. 01/15/23   Thomas, Lisa K, NP  polyethylene glycol powder (MIRALAX ) 17 GM/SCOOP powder Take 17 g by mouth 2 (two) times daily. 11/24/22   Sebastian Toribio GAILS, MD  potassium chloride  (MICRO-K ) 10 MEQ CR capsule Take 2 capsules (20 mEq total) by mouth 2 (two) times daily. 11/24/22   Sebastian Toribio GAILS, MD  prochlorperazine  (COMPAZINE ) 10 MG tablet Take 1 tablet (10 mg total) by mouth every 6 (six) hours as needed for nausea or vomiting. 01/21/23   Cloretta Arley NOVAK, MD  umeclidinium bromide  (INCRUSE ELLIPTA ) 62.5 MCG/ACT AEPB Inhale 1 puff into the lungs daily. Patient not taking: Reported on 01/20/2023 11/25/22   Sebastian Toribio GAILS, MD      Allergies    Lisinopril, Irinotecan , Leucovorin  calcium , and Oxaliplatin     Review of Systems   Review of  Systems  Respiratory:  Negative for shortness of breath.   Neurological:  Positive for weakness.    Physical Exam Updated Vital Signs BP (!) 119/92   Pulse 94   Temp 98.3 F (36.8 C) (Oral)   Resp (!) 22   LMP 01/07/2006   SpO2 100%  Physical Exam Vitals and nursing note reviewed.  Constitutional:      General: She is not in acute distress.    Appearance: She is well-developed.  HENT:     Head: Normocephalic and atraumatic.  Eyes:     Conjunctiva/sclera: Conjunctivae normal.  Cardiovascular:     Rate and Rhythm: Normal rate and regular rhythm.     Heart sounds: No murmur  heard. Pulmonary:     Effort: Pulmonary effort is normal. No respiratory distress.     Breath sounds: Normal breath sounds.  Abdominal:     Palpations: Abdomen is soft.     Tenderness: There is abdominal tenderness in the right upper quadrant, right lower quadrant, epigastric area and left lower quadrant. There is no guarding or rebound. Negative signs include Murphy's sign, Rovsing's sign and McBurney's sign.  Musculoskeletal:        General: No swelling.     Cervical back: Neck supple.  Skin:    General: Skin is warm and dry.     Capillary Refill: Capillary refill takes less than 2 seconds.  Neurological:     Mental Status: She is alert.  Psychiatric:        Mood and Affect: Mood normal.     ED Results / Procedures / Treatments   Labs (all labs ordered are listed, but only abnormal results are displayed) Labs Reviewed  COMPREHENSIVE METABOLIC PANEL - Abnormal; Notable for the following components:      Result Value   Sodium 132 (*)    Potassium 2.5 (*)    Chloride 93 (*)    Glucose, Bld 120 (*)    Total Protein 8.2 (*)    Alkaline Phosphatase 408 (*)    All other components within normal limits  CBC WITH DIFFERENTIAL/PLATELET  MAGNESIUM   URINALYSIS, ROUTINE W REFLEX MICROSCOPIC  TROPONIN I (HIGH SENSITIVITY)  TROPONIN I (HIGH SENSITIVITY)    EKG EKG Interpretation Date/Time:  Saturday January 24 2023 14:36:39 EST Ventricular Rate:  90 PR Interval:  133 QRS Duration:  98 QT Interval:  380 QTC Calculation: 465 R Axis:   57  Text Interpretation: Sinus rhythm Left atrial enlargement Left ventricular hypertrophy Abnormal ECG Confirmed by Garrick Charleston [phone]) on 01/24/2023 3:00:03 PM  Radiology CT CHEST ABDOMEN PELVIS W CONTRAST Result Date: 01/24/2023 CLINICAL DATA:  Abdominal pain radiating to the chest. Intractable nausea and vomiting. Rectal cancer with liver metastasis. EXAM: CT CHEST, ABDOMEN, AND PELVIS WITH CONTRAST TECHNIQUE: Multidetector CT imaging of  the chest, abdomen and pelvis was performed following the standard protocol during bolus administration of intravenous contrast. RADIATION DOSE REDUCTION: This exam was performed according to the departmental dose-optimization program which includes automated exposure control, adjustment of the mA and/or kV according to patient size and/or use of iterative reconstruction technique. CONTRAST:  OMNIPAQUE  IOHEXOL  300 MG/ML  SOLN COMPARISON:  Abdominopelvic CT 12/24/2022.  Chest CT 12/15/2022 FINDINGS: CT CHEST FINDINGS Cardiovascular: Left upper extremity central line tip at the atrial caval junction. The heart is normal in size. No pericardial effusion. No aortic aneurysm or acute aortic findings. No central pulmonary embolus on this exam not tailored to pulmonary artery assessment. Mediastinum/Nodes: No enlarged mediastinal or hilar  lymph nodes. No axillary adenopathy. Unremarkable thyroid gland. Decompressed esophagus. Lungs/Pleura: Emphysema with biapical bulla. No acute airspace disease. No pleural effusion. No pulmonary nodule or mass. The trachea and central airways are clear. Musculoskeletal: No lytic or destructive bone lesions. No blastic bone lesions. No acute osseous findings. Slight scoliotic curvature of the spine. No chest wall soft tissue abnormalities. CT ABDOMEN PELVIS FINDINGS Hepatobiliary: Posterior right and left intrahepatic biliary ductal dilatation is not significantly changed from prior exam. Biliary stents remain in place. Common bile duct is mildly dilated around the stents, 9 mm, without significant interval change. The hypodense left lateral liver lesion on prior exam is not definitively seen currently. Tiny hypodensity in the central right lobe measuring 6 mm, series 2, image 53, unchanged. 5 mm hypodensity in the left lobe series 2, image 54, unchanged. 55. Liver parenchyma is diffusely heterogeneous, no convincing new hepatic lesion. No calcified gallstone or gallbladder  inflammation. Pancreas: Stable pancreatic ductal prominence of 4 mm. No peripancreatic inflammation or evidence of focal pancreatic abnormality. Spleen: Normal in size without focal abnormality. Adrenals/Urinary Tract: No adrenal nodule. There are bilateral nonobstructing intrarenal calculi. Mild right renal atrophy is chronic. Antonio Creswell left renal cyst. No further follow-up imaging is recommended. There is narrowing of the left renal vein as it courses under the SMA with perihilar and retroperitoneal venous collaterals. Partially distended urinary bladder, grossly normal for degree of distension. Stomach/Bowel: Bowel assessment is limited in the absence of enteric contrast and paucity of intra-abdominal fat. Mild pre pyloric gastric wall thickening series 2, image 67. No perigastric fat stranding or inflammation. There is no Emryn Flanery bowel obstruction. Occasional fluid-filled Airyanna Dipalma bowel in the lower abdomen and pelvis without wall thickening or inflammatory change. Ameila Weldon to moderate volume of stool in the colon. Wall thickening versus nondistention of the transverse colon. There is wall thickening of the distal sigmoid colon and rectum. Poorly defined fat plane between the rectum in the uterus. Vascular/Lymphatic: Aortic atherosclerosis without aneurysm. Portal and splenic veins are patent. There is narrowing of the left renal vein as it courses under the SMA. Left perihilar and retroperitoneal venous collaterals. No discrete nodal enlargement. Reproductive: Poorly defined fat plane between the uterus and rectum as before. 3.9 cm rounded fluid density structure in the posterior left pelvis, series 2, image 103. This may represent loculated ascites, has increased in size from prior exam. Other: Omental studding and nodularity anteriorly, in the pelvis and left pericolic gutter. Some of these have increased from prior exam. Reference pericolic gutter nodule measuring 15 mm series 2, image 83, previously 11 mm. 16 mm  pericolic gutter deposit series 2, image 77, previously 17 mm. Right anterior omental nodularity measuring 25 mm, series 2, image 92, previously 17 mm. No abdominal free fluid. No free air or focal fluid collection. Musculoskeletal: No lytic or destructive bone lesion. No acute osseous findings. Mild scoliotic curvature of the spine. 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 IMPRESSION: 1. No acute intrathoracic findings or evidence of metastatic disease in the chest. 2. Findings consistent with metastatic rectal cancer in the abdomen. Slight increase in omental nodularity over the past month. 3. The previous left lobe liver lesion is not definitively seen on prior exam. Biliary stents remain in place with stable intrahepatic biliary ductal dilatation. 4. Bilateral nonobstructing intrarenal calculi. 5. Narrowing of the left renal vein as it courses under the SMA with retroperitoneal and perihilar collaterals. 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 6. Wall  thickening of the distal stomach, also seen on prior exam. This may represent gastritis or peptic ulcer disease. Wall thickening versus nondistention in the transverse colon. 7.  Aortic Atherosclerosis (ICD10-I70.0). Electronically Signed   By: Andrea Gasman M.D.   On: 01/24/2023 16:57    Procedures Procedures    Medications Ordered in ED Medications  potassium chloride  10 mEq in 100 mL IVPB (10 mEq Intravenous New Bag/Given 01/24/23 1613)  fentaNYL  (SUBLIMAZE ) injection 50 mcg (50 mcg Intravenous Given 01/24/23 1438)  ondansetron  (ZOFRAN ) injection 4 mg (4 mg Intravenous Given 01/24/23 1437)  sodium chloride  0.9 % bolus 1,000 mL (1,000 mLs Intravenous New Bag/Given 01/24/23 1443)  iohexol  (OMNIPAQUE ) 300 MG/ML solution 100 mL (100 mLs Intravenous Contrast Given 01/24/23 1621)  prochlorperazine  (COMPAZINE ) injection 10 mg (10 mg Intravenous Given 01/24/23 1639)  diphenhydrAMINE  (BENADRYL ) injection 25  mg (25 mg Intravenous Given 01/24/23 1640)    ED Course/ Medical Decision Making/ A&P                                 Medical Decision Making Patient is a 48 year old female, history of metastatic colon cancer, who presents to the ED secondary to diffuse abdominal pain radiating to the chest has been going on for the last 4 days, after having her second round of a immunotherapy.  She states she is also having difficult time peeing, we will obtain bladder scan, urinalysis, CT chest, abdomen, pelvis also secondary to the history of cancer.  Will give antiemetics, and pain medicine as she has intractable nausea, vomiting.  Amount and/or Complexity of Data Reviewed Labs: ordered.    Details: Potassium of 2.5, magnesium  within normal limits.  No other electrolyte deficits, troponin within normal limits Radiology: ordered.    Details: CT chest, abdomen, pelvis shows no acute findings, findings consistent for metastatic rectal cancer, with possible worsening of the omentum.  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 ECG/medicine tests:  Decision-making details documented in ED Course. Discussion of management or test interpretation with external provider(s): Patient is a 48 year old female, here for severe abdominal pain, not intractable nausea, vomiting, she is feeling little bit better after the Zofran  and Compazine , however still cannot p.o. trial.  She has hypokalemia of 2.5, IV potassium x 40 mEq has been ordered, however she is unable to tolerate p.o. intake.  We admitted to Dr. Celinda, for further management.  Of note she was complaining of some difficult time urinating, but was able to urinate for us , she states this has been going on for several months, and she has chronic lower abdominal pain, I discussed with Dr. 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 Decision regarding hospitalization.   Final Clinical Impression(s) / ED Diagnoses Final diagnoses:  Hypokalemia  Nausea and vomiting, unspecified vomiting type  Generalized abdominal pain    Rx / DC Orders ED Discharge Orders     None         Philippa Lyle CROME, PA 01/24/23 1718    Garrick Charleston, MD 01/24/23 1945

## 2023-01-25 DIAGNOSIS — J439 Emphysema, unspecified: Secondary | ICD-10-CM | POA: Diagnosis present

## 2023-01-25 DIAGNOSIS — N200 Calculus of kidney: Secondary | ICD-10-CM | POA: Diagnosis present

## 2023-01-25 DIAGNOSIS — Z833 Family history of diabetes mellitus: Secondary | ICD-10-CM | POA: Diagnosis not present

## 2023-01-25 DIAGNOSIS — Z7960 Long term (current) use of unspecified immunomodulators and immunosuppressants: Secondary | ICD-10-CM | POA: Diagnosis not present

## 2023-01-25 DIAGNOSIS — K838 Other specified diseases of biliary tract: Secondary | ICD-10-CM | POA: Diagnosis present

## 2023-01-25 DIAGNOSIS — E871 Hypo-osmolality and hyponatremia: Secondary | ICD-10-CM | POA: Diagnosis present

## 2023-01-25 DIAGNOSIS — Z823 Family history of stroke: Secondary | ICD-10-CM | POA: Diagnosis not present

## 2023-01-25 DIAGNOSIS — K529 Noninfective gastroenteritis and colitis, unspecified: Secondary | ICD-10-CM | POA: Diagnosis present

## 2023-01-25 DIAGNOSIS — Z8249 Family history of ischemic heart disease and other diseases of the circulatory system: Secondary | ICD-10-CM | POA: Diagnosis not present

## 2023-01-25 DIAGNOSIS — Z9889 Other specified postprocedural states: Secondary | ICD-10-CM | POA: Diagnosis not present

## 2023-01-25 DIAGNOSIS — I10 Essential (primary) hypertension: Secondary | ICD-10-CM | POA: Diagnosis present

## 2023-01-25 DIAGNOSIS — C787 Secondary malignant neoplasm of liver and intrahepatic bile duct: Secondary | ICD-10-CM | POA: Diagnosis present

## 2023-01-25 DIAGNOSIS — Z79899 Other long term (current) drug therapy: Secondary | ICD-10-CM | POA: Diagnosis not present

## 2023-01-25 DIAGNOSIS — Z7951 Long term (current) use of inhaled steroids: Secondary | ICD-10-CM | POA: Diagnosis not present

## 2023-01-25 DIAGNOSIS — Z87442 Personal history of urinary calculi: Secondary | ICD-10-CM | POA: Diagnosis not present

## 2023-01-25 DIAGNOSIS — R1110 Vomiting, unspecified: Secondary | ICD-10-CM | POA: Diagnosis present

## 2023-01-25 DIAGNOSIS — K5909 Other constipation: Secondary | ICD-10-CM | POA: Diagnosis present

## 2023-01-25 DIAGNOSIS — E8809 Other disorders of plasma-protein metabolism, not elsewhere classified: Secondary | ICD-10-CM | POA: Diagnosis present

## 2023-01-25 DIAGNOSIS — Z85048 Personal history of other malignant neoplasm of rectum, rectosigmoid junction, and anus: Secondary | ICD-10-CM | POA: Diagnosis not present

## 2023-01-25 DIAGNOSIS — Z888 Allergy status to other drugs, medicaments and biological substances status: Secondary | ICD-10-CM | POA: Diagnosis not present

## 2023-01-25 DIAGNOSIS — Z8619 Personal history of other infectious and parasitic diseases: Secondary | ICD-10-CM | POA: Diagnosis not present

## 2023-01-25 DIAGNOSIS — E876 Hypokalemia: Secondary | ICD-10-CM | POA: Diagnosis present

## 2023-01-25 DIAGNOSIS — Z98891 History of uterine scar from previous surgery: Secondary | ICD-10-CM | POA: Diagnosis not present

## 2023-01-25 DIAGNOSIS — Z87891 Personal history of nicotine dependence: Secondary | ICD-10-CM | POA: Diagnosis not present

## 2023-01-25 DIAGNOSIS — I7121 Aneurysm of the ascending aorta, without rupture: Secondary | ICD-10-CM | POA: Diagnosis present

## 2023-01-25 LAB — COMPREHENSIVE METABOLIC PANEL
ALT: 19 U/L (ref 0–44)
AST: 22 U/L (ref 15–41)
Albumin: 2.9 g/dL — ABNORMAL LOW (ref 3.5–5.0)
Alkaline Phosphatase: 342 U/L — ABNORMAL HIGH (ref 38–126)
Anion gap: 5 (ref 5–15)
BUN: 7 mg/dL (ref 6–20)
CO2: 31 mmol/L (ref 22–32)
Calcium: 8.3 mg/dL — ABNORMAL LOW (ref 8.9–10.3)
Chloride: 101 mmol/L (ref 98–111)
Creatinine, Ser: 0.49 mg/dL (ref 0.44–1.00)
GFR, Estimated: >60 mL/min (ref >60)
Glucose, Bld: 87 mg/dL (ref 70–99)
Potassium: 3.1 mmol/L — ABNORMAL LOW (ref 3.5–5.1)
Sodium: 137 mmol/L (ref 135–145)
Total Bilirubin: 0.4 mg/dL (ref 0.0–1.2)
Total Protein: 6.5 g/dL (ref 6.5–8.1)

## 2023-01-25 LAB — C DIFFICILE QUICK SCREEN W PCR REFLEX
C Diff antigen: POSITIVE — AB
C Diff toxin: NEGATIVE

## 2023-01-25 LAB — CBC
HCT: 32.3 % — ABNORMAL LOW (ref 36.0–46.0)
Hemoglobin: 10.5 g/dL — ABNORMAL LOW (ref 12.0–15.0)
MCH: 30.3 pg (ref 26.0–34.0)
MCHC: 32.5 g/dL (ref 30.0–36.0)
MCV: 93.1 fL (ref 80.0–100.0)
Platelets: 299 10*3/uL (ref 150–400)
RBC: 3.47 MIL/uL — ABNORMAL LOW (ref 3.87–5.11)
RDW: 13.9 % (ref 11.5–15.5)
WBC: 7.9 10*3/uL (ref 4.0–10.5)
nRBC: 0 % (ref 0.0–0.2)

## 2023-01-25 LAB — CLOSTRIDIUM DIFFICILE BY PCR, REFLEXED: Toxigenic C. Difficile by PCR: NEGATIVE

## 2023-01-25 MED ORDER — SIMETHICONE 40 MG/0.6ML PO SUSP
40.0000 mg | Freq: Four times a day (QID) | ORAL | Status: DC | PRN
  Administered 2023-01-25: 40 mg via ORAL
  Filled 2023-01-25 (×2): qty 0.6

## 2023-01-25 MED ORDER — CALCIUM CARBONATE ANTACID 500 MG PO CHEW
1.0000 | CHEWABLE_TABLET | Freq: Three times a day (TID) | ORAL | Status: DC | PRN
  Filled 2023-01-25: qty 1

## 2023-01-25 MED ORDER — PANTOPRAZOLE SODIUM 40 MG IV SOLR
40.0000 mg | INTRAVENOUS | Status: DC
  Administered 2023-01-25 – 2023-01-26 (×2): 40 mg via INTRAVENOUS
  Filled 2023-01-25 (×2): qty 10

## 2023-01-25 MED ORDER — ALUM & MAG HYDROXIDE-SIMETH 200-200-20 MG/5ML PO SUSP
30.0000 mL | Freq: Four times a day (QID) | ORAL | Status: DC | PRN
  Administered 2023-01-25 (×2): 30 mL via ORAL
  Filled 2023-01-25 (×2): qty 30

## 2023-01-25 MED ORDER — CHLORHEXIDINE GLUCONATE CLOTH 2 % EX PADS
6.0000 | MEDICATED_PAD | Freq: Every day | CUTANEOUS | Status: DC
  Administered 2023-01-25 – 2023-01-27 (×3): 6 via TOPICAL

## 2023-01-25 MED ORDER — POTASSIUM CHLORIDE 10 MEQ/100ML IV SOLN
10.0000 meq | INTRAVENOUS | Status: AC
  Administered 2023-01-25 (×4): 10 meq via INTRAVENOUS
  Filled 2023-01-25 (×4): qty 100

## 2023-01-25 NOTE — Hospital Course (Addendum)
 April [name] is a 48 y.o. female with medical history significant of essential hypertension, nephrolithiasis, hydronephrosis, cellulitis, ascending aortic aneurysm, chronic constipation, stage IV colon cancer who had chemotherapy 4 days ago presented to hospital with next day nausea, 6-8 episodes of emesis daily, fatigue and malaise.  She also complained of epigastric tenderness and is complaining of supragastric burning sensation from vomiting.     In the ED, patient had elevated blood pressure.  Labs showed mild hyponatremia with sodium 132, hypokalemia with potassium 2.5.  CT chest/abdomen/pelvis with contrast showing metastatic rectal cancer in the abdomen with slight increase in omental nodularity over the past month.  The previously seen left lobe liver lesion is not definitely seen on prior exam.  Biliary stents remain in place with stable intrahepatic biliary ductal dilatation.  Bilateral nonobstructing intrarenal calculi.  Narrowing of the left renal vein as it courses under the SMA with the retroperitoneum.  Hilar collaterals.  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  Wall thickening of the distal stomach also seen on prior exam.  This may represent gastritis or PUD.   The patient received 1000 mL normal saline bolus, KCl 10 mEq IVPB x 4, prochlorperazine  10 mg IVP, ondansetron  4 mg IVP, fentanyl  50 mcg IVP and diphenhydramine  25 mg IVP and was considered for admission to the hospital for further evaluation and treatment..  Assessment and plan.  Intractable nausea vomiting  Continue clears, antiemetics IV fluids.  Advance diet as tolerated.  Continue antiemetics.  hypokalemia secondary to vomiting. Potassium of 3.1.  Initial potassium of 2.5.  Will aggressively replace.  Hold off with HCTZ.  Continue IV fluid, clears, antiemetics PPI.    Hyponatremia Secondary to GI losses and HCTZ use.  Improved at this time.  Latest sodium of 137     Metastatic colon cancer to liver  Follow-up with oncology as outpatient.  Getting chemotherapy.     Elevated alkaline phosphatase level Secondary to liver mets.     Hypertension Has not taken HCTZ or metoprolol  since for few days now.  Continue to hold hydrochlorothiazide  due to hyponatremia/hypokalemia.     Chronic constipation Resume Linzess  once tolerating oral intake. Lactulose  as needed once back on diet.     Emphysema/COPD Bronchodilators as needed.

## 2023-01-25 NOTE — Progress Notes (Addendum)
 Same day note  April [name] is a 48 y.o. female with medical history significant of essential hypertension, nephrolithiasis, hydronephrosis, cellulitis, ascending aortic aneurysm, chronic constipation, stage IV colon cancer who had chemotherapy 4 days ago presented to hospital with next day nausea, 6-8 episodes of emesis daily, fatigue and malaise.  She also complained of epigastric tenderness and is complaining of supragastric burning sensation from vomiting.     In the ED, patient had elevated blood pressure.  Labs showed mild hyponatremia with sodium 132, hypokalemia with potassium 2.5.  CT chest/abdomen/pelvis with contrast showing metastatic rectal cancer in the abdomen with slight increase in omental nodularity over the past month.  The previously seen left lobe liver lesion is not definitely seen on prior exam.  Biliary stents remain in place with stable intrahepatic biliary ductal dilatation.  Bilateral nonobstructing intrarenal calculi.  Narrowing of the left renal vein as it courses under the SMA with the retroperitoneum.  Hilar collaterals.  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  Wall thickening of the distal stomach also seen on prior exam.  This may represent gastritis or PUD.   The patient received 1000 mL normal saline bolus, KCl 10 mEq IVPB x 4, prochlorperazine  10 mg IVP, ondansetron  4 mg IVP, fentanyl  50 mcg IVP and diphenhydramine  25 mg IVP and was considered for admission to the hospital for further evaluation and treatment.  Patient seen and examined at bedside.  Patient was admitted to the hospital for nausea vomiting  At the time of my evaluation, patient complains of nausea vomiting and diarrhea.  Feels gaseous distention.  Physical examination reveals obese built female, abdominal discomfort on palpation, dry mucosa  Laboratory data and imaging was reviewed  Assessment and plan.  Intractable nausea vomiting  Continue  clears, antiemetics IV fluids.  Advance diet as tolerated.  Continue antiemetics.  hypokalemia secondary to vomiting. Potassium of 3.1.  Initial potassium of 2.5.  Will aggressively replace.  Hold off with HCTZ.  Continue IV fluid, clears, antiemetics PPI.  Advance diet as tolerated.  Add Protonix  IV, simethicone .    Hyponatremia Secondary to GI losses and HCTZ use.  Improved at this time.  Latest sodium of 137  Diarrhea.  Will get stool for C. difficile.  Normally constipated.  Takes Linzess  at home but currently not on it.    Metastatic colon cancer to liver  Follow-up with oncology as outpatient.  Getting chemotherapy.  Focus on pain management.     Elevated alkaline phosphatase level Secondary to liver mets.  Complains of mild abdominal pain.     Hypertension Has not taken HCTZ or metoprolol  since for few days now.  Continue to hold hydrochlorothiazide  due to hyponatremia/hypokalemia.     Emphysema/COPD Bronchodilators as needed.  Appears compensated.  No Charge  Signed,  Vernal Anselm Alstrom, MD [facility]

## 2023-01-26 ENCOUNTER — Encounter: Payer: Self-pay | Admitting: Oncology

## 2023-01-26 DIAGNOSIS — E876 Hypokalemia: Secondary | ICD-10-CM | POA: Diagnosis not present

## 2023-01-26 LAB — BASIC METABOLIC PANEL
Anion gap: 6 (ref 5–15)
BUN: 5 mg/dL — ABNORMAL LOW (ref 6–20)
CO2: 28 mmol/L (ref 22–32)
Calcium: 8.5 mg/dL — ABNORMAL LOW (ref 8.9–10.3)
Chloride: 101 mmol/L (ref 98–111)
Creatinine, Ser: <0.3 mg/dL — ABNORMAL LOW (ref 0.44–1.00)
Glucose, Bld: 84 mg/dL (ref 70–99)
Potassium: 3.3 mmol/L — ABNORMAL LOW (ref 3.5–5.1)
Sodium: 135 mmol/L (ref 135–145)

## 2023-01-26 LAB — CBC
HCT: 30 % — ABNORMAL LOW (ref 36.0–46.0)
Hemoglobin: 10 g/dL — ABNORMAL LOW (ref 12.0–15.0)
MCH: 30.4 pg (ref 26.0–34.0)
MCHC: 33.3 g/dL (ref 30.0–36.0)
MCV: 91.2 fL (ref 80.0–100.0)
Platelets: 271 10*3/uL (ref 150–400)
RBC: 3.29 MIL/uL — ABNORMAL LOW (ref 3.87–5.11)
RDW: 13.6 % (ref 11.5–15.5)
WBC: 7.7 10*3/uL (ref 4.0–10.5)
nRBC: 0 % (ref 0.0–0.2)

## 2023-01-26 LAB — MAGNESIUM: Magnesium: 1.8 mg/dL (ref 1.7–2.4)

## 2023-01-26 MED ORDER — SODIUM CHLORIDE 0.9 % IV SOLN
INTRAVENOUS | Status: AC
  Administered 2023-01-27: 75 mL/h via INTRAVENOUS

## 2023-01-26 MED ORDER — POTASSIUM CHLORIDE 10 MEQ/100ML IV SOLN
10.0000 meq | INTRAVENOUS | Status: AC
  Administered 2023-01-26 (×4): 10 meq via INTRAVENOUS
  Filled 2023-01-26 (×4): qty 100

## 2023-01-26 NOTE — Progress Notes (Signed)
 PROGRESS NOTE  Mekayla Soman FMW:[id number] DOB: [birth year] DOA: 01/24/2023 PCP: Oris Camie BRAVO, NP   LOS: 1 day   Brief narrative:  April [name] is a 48 y.o. female with medical history significant of essential hypertension, nephrolithiasis, hydronephrosis, cellulitis, ascending aortic aneurysm, chronic constipation, stage IV colon cancer who had chemotherapy 4 days ago presented to hospital with next day nausea, 6-8 episodes of emesis daily, fatigue and malaise.  She also complained of epigastric tenderness and is complaining of supragastric burning sensation from vomiting.    In the ED, patient had elevated blood pressure.  Labs showed mild hyponatremia with sodium 132, hypokalemia with potassium 2.5.  CT chest/abdomen/pelvis with contrast showing metastatic rectal cancer in the abdomen with slight increase in omental nodularity over the past month.  The previously seen left lobe liver lesion is not definitely seen on prior exam.  Biliary stents remain in place with stable intrahepatic biliary ductal dilatation.  Bilateral nonobstructing intrarenal calculi.  Narrowing of the left renal vein as it courses under the SMA with the retroperitoneum.  Hilar collaterals.  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  Wall thickening of the distal stomach also seen on prior exam.  This may represent gastritis or PUD.  Patient was then admitted to the hospital for further evaluation and treatment.  Assessment and plan.  Intractable nausea vomiting  Continue antiemetics hydration.  Patient had initial improvement yesterday but then had vomiting yesterday and continues to have nausea.  Will downgrade the diet to full liquids again today.  Multiple episodes of diarrhea prior to coming to the hospital.  C. difficile test was negative.  Likely secondary to gastroenteritis.  hypokalemia secondary to vomiting. Potassium of 3.3.  Will continue  replacement IV potassium due to Decreased p.o. tolerance..  Check levels in AM.  Continue to hold HCTZ.    Hyponatremia Improved at this time.  Latest sodium of 135.    Metastatic colon cancer to liver  Follow-up with oncology as outpatient.  Getting chemotherapy as outpatient..     Elevated alkaline phosphatase level Secondary to liver mets.     Hypertension Has not taken HCTZ or metoprolol  since for few days now.  Continue to hold hydrochlorothiazide  due to hyponatremia/hypokalemia.  Blood pressure elevated at this time.  Will resume metoprolol .     Chronic constipation Resume Linzess  once tolerating oral intake. Lactulose  as needed once back on diet.  Currently having episodes of diarrhea.     Emphysema/COPD Bronchodilators as needed.  Appears compensated.  DVT prophylaxis: enoxaparin  (LOVENOX ) injection 40 mg Start: 01/24/23 2200   Disposition: Home likely in 1 to 2 days when clinically improved.  Status is: Inpatient Remains inpatient appropriate because: Hypokalemia, pending clinical improvement, decreased p.o. tolerance    Code Status:     Code Status: Full Code  Family Communication: None at bedside  Consultants: None  Procedures: None  Anti-infectives:  None  Anti-infectives (From admission, onward)    None        Subjective: Today, patient was seen and examined at bedside.  States that she did have vomiting yesterday when diet was advanced and wishes to downgrade.  Feels like pain in the abdomen after urinating and passing gas or bowel movement.  Feels a incomplete sensation of voiding.  Objective: Vitals:   01/25/23 2131 01/26/23 0608  BP: (!) 144/102 (!) 145/112  Pulse: 84 77  Resp: 14 14  Temp: 98.7 F (37.1 C) 98.3 F (36.8 C)  SpO2:  100% 100%    Intake/Output Summary (Last 24 hours) at 01/26/2023 0914 Last data filed at 01/25/2023 2000 Gross per 24 hour  Intake 600 ml  Output --  Net 600 ml   Filed Weights   01/24/23 1904   Weight: 59.9 kg   Body mass index is 22.67 kg/m.   Physical Exam:  GENERAL: Patient is alert awake and oriented. Not in obvious distress. HENT: No scleral pallor or icterus. Pupils equally reactive to light. Oral mucosa is moist NECK: is supple, no gross swelling noted. CHEST: Clear to auscultation. No crackles or wheezes.  Diminished breath sounds bilaterally.  PICC line in place. CVS: S1 and S2 heard, no murmur. Regular rate and rhythm.  ABDOMEN: Soft, non-tender, bowel sounds are present. EXTREMITIES: No edema. CNS: Cranial nerves are intact. No focal motor deficits. SKIN: warm and dry without rashes.  Data Review: I have personally reviewed the following laboratory data and studies,  CBC: Recent Labs  Lab 01/20/23 0800 01/24/23 1444 01/25/23 0440 01/26/23 0440  WBC 7.6 7.7 7.9 7.7  NEUTROABS 3.2 3.5  --   --   HGB 10.9* 12.3 10.5* 10.0*  HCT 32.2* 36.7 32.3* 30.0*  MCV 90.4 90.6 93.1 91.2  PLT 348 356 299 271   Basic Metabolic Panel: Recent Labs  Lab 01/20/23 0800 01/24/23 1444 01/25/23 0440 01/26/23 0440  NA 140 132* 137 135  K 3.3* 2.5* 3.1* 3.3*  CL 101 93* 101 101  CO2 29 30 31 28   GLUCOSE 92 120* 87 84  BUN 7 15 7  5*  CREATININE 0.39* 0.46 0.49 <0.30*  CALCIUM  9.3 8.9 8.3* 8.5*  MG 1.8 1.8  --  1.8   Liver Function Tests: Recent Labs  Lab 01/20/23 0800 01/24/23 1444 01/25/23 0440  AST 38 24 22  ALT 25 21 19   ALKPHOS 448* 408* 342*  BILITOT 0.3 0.4 0.4  PROT 7.6 8.2* 6.5  ALBUMIN 3.8 3.7 2.9*   No results for input(s): LIPASE, AMYLASE in the last 168 hours. No results for input(s): AMMONIA in the last 168 hours. Cardiac Enzymes: No results for input(s): CKTOTAL, CKMB, CKMBINDEX, TROPONINI in the last 168 hours. BNP (last 3 results) No results for input(s): BNP in the last 8760 hours.  ProBNP (last 3 results) No results for input(s): PROBNP in the last 8760 hours.  CBG: No results for input(s): GLUCAP in the last  168 hours. Recent Results (from the past 240 hours)  Urine Culture     Status: Abnormal   Collection Time: 01/20/23  8:38 AM   Specimen: Urine, Clean Catch  Result Value Ref Range Status   Specimen Description   Final    URINE, CLEAN CATCH Performed at [facility], [address], [place], [place] [address]    Special Requests   Final    NONE Performed at [facility], [address], [place], [place] [address]    Culture MULTIPLE SPECIES PRESENT, SUGGEST RECOLLECTION (A)  Final   Report Status 01/21/2023 FINAL  Final  C Difficile Quick Screen w PCR reflex     Status: Abnormal   Collection Time: 01/25/23 12:22 PM   Specimen: STOOL  Result Value Ref Range Status   C Diff antigen POSITIVE (A) NEGATIVE Final   C Diff toxin NEGATIVE NEGATIVE Final   C Diff interpretation Results are indeterminate. See PCR results.  Final    Comment: VALID Performed at [facility], [address], [place], [place] [address]   C.  Diff by PCR, Reflexed     Status: None   Collection Time: 01/25/23 12:22 PM  Result Value Ref Range Status   Toxigenic C. Difficile by PCR NEGATIVE NEGATIVE Final    Comment: Patient is colonized with non toxigenic C. difficile. May not need treatment unless significant symptoms are present. Performed at [facility], [address], [place], [place] [address]      Studies: CT CHEST ABDOMEN PELVIS W CONTRAST Result Date: 01/24/2023 CLINICAL DATA:  Abdominal pain radiating to the chest. Intractable nausea and vomiting. Rectal cancer with liver metastasis. EXAM: CT CHEST, ABDOMEN, AND PELVIS WITH CONTRAST TECHNIQUE: Multidetector CT imaging of the chest, abdomen and pelvis was performed following the standard protocol during bolus administration of intravenous contrast. RADIATION DOSE REDUCTION: This exam was performed according to the departmental dose-optimization program which includes automated exposure  control, adjustment of the mA and/or kV according to patient size and/or use of iterative reconstruction technique. CONTRAST:  OMNIPAQUE  IOHEXOL  300 MG/ML  SOLN COMPARISON:  Abdominopelvic CT 12/24/2022.  Chest CT 12/15/2022 FINDINGS: CT CHEST FINDINGS Cardiovascular: Left upper extremity central line tip at the atrial caval junction. The heart is normal in size. No pericardial effusion. No aortic aneurysm or acute aortic findings. No central pulmonary embolus on this exam not tailored to pulmonary artery assessment. Mediastinum/Nodes: No enlarged mediastinal or hilar lymph nodes. No axillary adenopathy. Unremarkable thyroid gland. Decompressed esophagus. Lungs/Pleura: Emphysema with biapical bulla. No acute airspace disease. No pleural effusion. No pulmonary nodule or mass. The trachea and central airways are clear. Musculoskeletal: No lytic or destructive bone lesions. No blastic bone lesions. No acute osseous findings. Slight scoliotic curvature of the spine. No chest wall soft tissue abnormalities. CT ABDOMEN PELVIS FINDINGS Hepatobiliary: Posterior right and left intrahepatic biliary ductal dilatation is not significantly changed from prior exam. Biliary stents remain in place. Common bile duct is mildly dilated around the stents, 9 mm, without significant interval change. The hypodense left lateral liver lesion on prior exam is not definitively seen currently. Tiny hypodensity in the central right lobe measuring 6 mm, series 2, image 53, unchanged. 5 mm hypodensity in the left lobe series 2, image 54, unchanged. 55. Liver parenchyma is diffusely heterogeneous, no convincing new hepatic lesion. No calcified gallstone or gallbladder inflammation. Pancreas: Stable pancreatic ductal prominence of 4 mm. No peripancreatic inflammation or evidence of focal pancreatic abnormality. Spleen: Normal in size without focal abnormality. Adrenals/Urinary Tract: No adrenal nodule. There are bilateral nonobstructing  intrarenal calculi. Mild right renal atrophy is chronic. Small left renal cyst. No further follow-up imaging is recommended. There is narrowing of the left renal vein as it courses under the SMA with perihilar and retroperitoneal venous collaterals. Partially distended urinary bladder, grossly normal for degree of distension. Stomach/Bowel: Bowel assessment is limited in the absence of enteric contrast and paucity of intra-abdominal fat. Mild pre pyloric gastric wall thickening series 2, image 67. No perigastric fat stranding or inflammation. There is no small bowel obstruction. Occasional fluid-filled small bowel in the lower abdomen and pelvis without wall thickening or inflammatory change. Small to moderate volume of stool in the colon. Wall thickening versus nondistention of the transverse colon. There is wall thickening of the distal sigmoid colon and rectum. Poorly defined fat plane between the rectum in the uterus. Vascular/Lymphatic: Aortic atherosclerosis without aneurysm. Portal and splenic veins are patent. There is narrowing of the left renal vein as it courses under the SMA. Left perihilar and retroperitoneal venous collaterals. No discrete  nodal enlargement. Reproductive: Poorly defined fat plane between the uterus and rectum as before. 3.9 cm rounded fluid density structure in the posterior left pelvis, series 2, image 103. This may represent loculated ascites, has increased in size from prior exam. Other: Omental studding and nodularity anteriorly, in the pelvis and left pericolic gutter. Some of these have increased from prior exam. Reference pericolic gutter nodule measuring 15 mm series 2, image 83, previously 11 mm. 16 mm pericolic gutter deposit series 2, image 77, previously 17 mm. Right anterior omental nodularity measuring 25 mm, series 2, image 92, previously 17 mm. No abdominal free fluid. No free air or focal fluid collection. Musculoskeletal: No lytic or destructive bone lesion. No acute  osseous findings. Mild scoliotic curvature of the spine. 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 IMPRESSION: 1. No acute intrathoracic findings or evidence of metastatic disease in the chest. 2. Findings consistent with metastatic rectal cancer in the abdomen. Slight increase in omental nodularity over the past month. 3. The previous left lobe liver lesion is not definitively seen on prior exam. Biliary stents remain in place with stable intrahepatic biliary ductal dilatation. 4. Bilateral nonobstructing intrarenal calculi. 5. Narrowing of the left renal vein as it courses under the SMA with retroperitoneal and perihilar collaterals. 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 6. Wall thickening of the distal stomach, also seen on prior exam. This may represent gastritis or peptic ulcer disease. Wall thickening versus nondistention in the transverse colon. 7.  Aortic Atherosclerosis (ICD10-I70.0). Electronically Signed   By: Andrea Gasman M.D.   On: 01/24/2023 16:57      Vernal Alstrom, MD  [facility] 01/26/2023  If 7PM-7AM, please contact night-coverage

## 2023-01-26 NOTE — Plan of Care (Signed)
  Problem: Education: Goal: Knowledge of General Education information will improve Description: Including pain rating scale, medication(s)/side effects and non-pharmacologic comfort measures Outcome: Progressing   Problem: Health Behavior/Discharge Planning: Goal: Ability to manage health-related needs will improve Outcome: Progressing   Problem: Clinical Measurements: Goal: Ability to maintain clinical measurements within normal limits will improve Outcome: Progressing Goal: Will remain free from infection Outcome: Progressing Goal: Diagnostic test results will improve Outcome: Progressing Goal: Respiratory complications will improve Outcome: Progressing Goal: Cardiovascular complication will be avoided Outcome: Progressing   Problem: Coping: Goal: Level of anxiety will decrease Outcome: Progressing   Problem: Nutrition: Goal: Adequate nutrition will be maintained Outcome: Progressing   Problem: Elimination: Goal: Will not experience complications related to bowel motility Outcome: Progressing Goal: Will not experience complications related to urinary retention Outcome: Progressing   Problem: Pain Management: Goal: General experience of comfort will improve Outcome: Progressing

## 2023-01-26 NOTE — Plan of Care (Signed)

## 2023-01-27 ENCOUNTER — Other Ambulatory Visit (HOSPITAL_COMMUNITY): Payer: Self-pay

## 2023-01-27 DIAGNOSIS — E876 Hypokalemia: Secondary | ICD-10-CM | POA: Diagnosis not present

## 2023-01-27 LAB — BASIC METABOLIC PANEL
Anion gap: 7 (ref 5–15)
BUN: 7 mg/dL (ref 6–20)
CO2: 27 mmol/L (ref 22–32)
Calcium: 8.6 mg/dL — ABNORMAL LOW (ref 8.9–10.3)
Chloride: 99 mmol/L (ref 98–111)
Creatinine, Ser: 0.48 mg/dL (ref 0.44–1.00)
GFR, Estimated: >60 mL/min (ref >60)
Glucose, Bld: 88 mg/dL (ref 70–99)
Potassium: 3.4 mmol/L — ABNORMAL LOW (ref 3.5–5.1)
Sodium: 133 mmol/L — ABNORMAL LOW (ref 135–145)

## 2023-01-27 LAB — CBC
HCT: 32.4 % — ABNORMAL LOW (ref 36.0–46.0)
Hemoglobin: 10.7 g/dL — ABNORMAL LOW (ref 12.0–15.0)
MCH: 30.3 pg (ref 26.0–34.0)
MCHC: 33 g/dL (ref 30.0–36.0)
MCV: 91.8 fL (ref 80.0–100.0)
Platelets: 303 10*3/uL (ref 150–400)
RBC: 3.53 MIL/uL — ABNORMAL LOW (ref 3.87–5.11)
RDW: 13.4 % (ref 11.5–15.5)
WBC: 7.5 10*3/uL (ref 4.0–10.5)
nRBC: 0 % (ref 0.0–0.2)

## 2023-01-27 LAB — MAGNESIUM: Magnesium: 1.7 mg/dL (ref 1.7–2.4)

## 2023-01-27 MED ORDER — PROCHLORPERAZINE MALEATE 10 MG PO TABS
10.0000 mg | ORAL_TABLET | ORAL | 0 refills | Status: DC | PRN
  Filled 2023-01-27 (×2): qty 30, 5d supply, fill #0

## 2023-01-27 MED ORDER — PANTOPRAZOLE SODIUM 40 MG PO TBEC
40.0000 mg | DELAYED_RELEASE_TABLET | Freq: Every day | ORAL | Status: DC
  Administered 2023-01-27: 40 mg via ORAL
  Filled 2023-01-27: qty 1

## 2023-01-27 MED ORDER — HEPARIN SOD (PORK) LOCK FLUSH 100 UNIT/ML IV SOLN
250.0000 [IU] | Freq: Once | INTRAVENOUS | Status: AC
  Administered 2023-01-27: 250 [IU] via INTRAVENOUS

## 2023-01-27 MED ORDER — HYDROCODONE-ACETAMINOPHEN 10-325 MG PO TABS
1.0000 | ORAL_TABLET | ORAL | 0 refills | Status: AC | PRN
  Filled 2023-01-27: qty 30, 5d supply, fill #0

## 2023-01-27 MED ORDER — PANTOPRAZOLE SODIUM 40 MG PO TBEC
40.0000 mg | DELAYED_RELEASE_TABLET | Freq: Every day | ORAL | 0 refills | Status: DC
  Filled 2023-01-27: qty 30, 30d supply, fill #0

## 2023-01-27 MED ORDER — HEPARIN SOD (PORK) LOCK FLUSH 100 UNIT/ML IV SOLN: 500.0000 [IU] | Freq: Once | INTRAVENOUS | Status: DC

## 2023-01-27 NOTE — Discharge Summary (Signed)
 Physician Discharge Summary  April [name] FMW:[id number] DOB: January 11, 1976 DOA: 01/24/2023  PCP: Oris Camie BRAVO, NP  Admit date: 01/24/2023 Discharge date: 01/27/2023  Admitted From: Home  Discharge disposition: Home    Recommendations for Outpatient Follow-Up:   Follow up with your primary care provider in one week.  Check CBC, BMP, magnesium  in the next visit Follow-up with oncology as outpatient.  Discharge Diagnosis:   Principal Problem:   Hypokalemia Active Problems:   Hypertension   Metastatic colon cancer to liver (HCC)   Chronic constipation   Intractable nausea and vomiting   Emphysema/COPD (HCC)   Hyponatremia   Hyperproteinemia   Elevated alkaline phosphatase level   Intractable vomiting   Discharge Condition: Improved.  Diet recommendation: Low sodium, heart healthy.    Wound care: None.  Code status: Full.   History of Present Illness:   April [name] is a 48 y.o. female with medical history significant of essential hypertension, nephrolithiasis, hydronephrosis, cellulitis, ascending aortic aneurysm, chronic constipation, stage IV colon cancer who had chemotherapy 4 days ago presented to hospital with next day nausea, 6-8 episodes of emesis daily, fatigue and malaise.  She also complained of epigastric tenderness and is complaining of supragastric burning sensation from vomiting.    In the ED, patient had elevated blood pressure.  Labs showed mild hyponatremia with sodium 132, hypokalemia with potassium 2.5.  CT chest/abdomen/pelvis with contrast showing metastatic rectal cancer in the abdomen with slight increase in omental nodularity over the past month.  The previously seen left lobe liver lesion is not definitely seen on prior exam.  Biliary stents remain in place with stable intrahepatic biliary ductal dilatation.  Bilateral nonobstructing intrarenal calculi.  Narrowing of the left renal vein as it courses under the SMA with  the retroperitoneum.  Hilar collaterals.  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  Wall thickening of the distal stomach also seen on prior exam.  This may represent gastritis or PUD.  Patient was then admitted to the hospital for further evaluation and treatment.   Hospital Course:   Following conditions were addressed during hospitalization as listed below,  Intractable nausea vomiting  Improved at this time, has tolerated oral diet.  Patient received IV fluids, IV antiemetics during hospitalization and gradually was advanced on her diet which she has tolerated.  Multiple episodes of diarrhea prior to coming to the hospital.  C. difficile test was negative.  Likely secondary to gastroenteritis.  Advised against laxatives at home if continues to have diarrhea.   hypokalemia secondary to vomiting. Improved at this time.  Will hold HCTZ for couple of days.  Has potassium at home which will be continued on discharge.  Advised to follow-up with PCP for BMP monitoring.    Hyponatremia Improved at this time.  Latest sodium of 133.  Due to GI losses HCTZ.     Metastatic colon cancer to liver  Follow-up with oncology as outpatient.  Getting chemotherapy as outpatient..     Elevated alkaline phosphatase level Secondary to liver mets.     Hypertension Had not taken HCTZ or metoprolol  since for few days now.  Continue to hold hydrochlorothiazide  until 01/29/2023 due to recent hyponatremia/hypokalemia.  New metoprolol  on discharge.     Chronic constipation States that she is not taking Linzess  as such.  Was having diarrhea so advised to watch while on laxatives     Emphysema/COPD Bronchodilators as needed.  Appears compensated.  Disposition.  At this time, patient  is stable for disposition with outpatient PCP and oncology follow-up.  Medical Consultants:   None.  Procedures:    None Subjective:   Today, patient was seen and examined at bedside.   Patient states that she feels better and has tolerated oral diet.  No nausea vomiting.  Feels good.  Wants to go home.  Discharge Exam:   Vitals:   01/26/23 2122 01/27/23 0525  BP: (!) 156/108 (!) 148/123  Pulse: 67 72  Resp: 18 18  Temp: 98.6 F (37 C) 98.6 F (37 C)  SpO2: 99% 100%   Vitals:   01/26/23 0608 01/26/23 1330 01/26/23 2122 01/27/23 0525  BP: (!) 145/112 (!) 154/108 (!) 156/108 (!) 148/123  Pulse: 77 72 67 72  Resp: 14 16 18 18   Temp: 98.3 F (36.8 C) 99.4 F (37.4 C) 98.6 F (37 C) 98.6 F (37 C)  TempSrc: Oral Oral    SpO2: 100% 100% 99% 100%  Weight:      Height:       Body mass index is 22.67 kg/m.   General: Alert awake, not in obvious distress HENT: pupils equally reacting to light,  No scleral pallor or icterus noted. Oral mucosa is moist.  Chest:  Clear breath sounds.   No crackles or wheezes.  CVS: S1 &S2 heard. No murmur.  Regular rate and rhythm. Abdomen: Soft, nontender, nondistended.  Bowel sounds are heard.   Extremities: No cyanosis, clubbing or edema.  Peripheral pulses are palpable.  PICC line in place. Psych: Alert, awake and oriented, normal mood CNS:  No cranial nerve deficits.  Power equal in all extremities.   Skin: Warm and dry.  No rashes noted.  The results of significant diagnostics from this hospitalization (including imaging, microbiology, ancillary and laboratory) are listed below for reference.     Diagnostic Studies:   CT CHEST ABDOMEN PELVIS W CONTRAST Result Date: 01/24/2023 CLINICAL DATA:  Abdominal pain radiating to the chest. Intractable nausea and vomiting. Rectal cancer with liver metastasis. EXAM: CT CHEST, ABDOMEN, AND PELVIS WITH CONTRAST TECHNIQUE: Multidetector CT imaging of the chest, abdomen and pelvis was performed following the standard protocol during bolus administration of intravenous contrast. RADIATION DOSE REDUCTION: This exam was performed according to the departmental dose-optimization program which  includes automated exposure control, adjustment of the mA and/or kV according to patient size and/or use of iterative reconstruction technique. CONTRAST:  OMNIPAQUE  IOHEXOL  300 MG/ML  SOLN COMPARISON:  Abdominopelvic CT 12/24/2022.  Chest CT 12/15/2022 FINDINGS: CT CHEST FINDINGS Cardiovascular: Left upper extremity central line tip at the atrial caval junction. The heart is normal in size. No pericardial effusion. No aortic aneurysm or acute aortic findings. No central pulmonary embolus on this exam not tailored to pulmonary artery assessment. Mediastinum/Nodes: No enlarged mediastinal or hilar lymph nodes. No axillary adenopathy. Unremarkable thyroid gland. Decompressed esophagus. Lungs/Pleura: Emphysema with biapical bulla. No acute airspace disease. No pleural effusion. No pulmonary nodule or mass. The trachea and central airways are clear. Musculoskeletal: No lytic or destructive bone lesions. No blastic bone lesions. No acute osseous findings. Slight scoliotic curvature of the spine. No chest wall soft tissue abnormalities. CT ABDOMEN PELVIS FINDINGS Hepatobiliary: Posterior right and left intrahepatic biliary ductal dilatation is not significantly changed from prior exam. Biliary stents remain in place. Common bile duct is mildly dilated around the stents, 9 mm, without significant interval change. The hypodense left lateral liver lesion on prior exam is not definitively seen currently. Tiny hypodensity in the central right lobe  measuring 6 mm, series 2, image 53, unchanged. 5 mm hypodensity in the left lobe series 2, image 54, unchanged. 55. Liver parenchyma is diffusely heterogeneous, no convincing new hepatic lesion. No calcified gallstone or gallbladder inflammation. Pancreas: Stable pancreatic ductal prominence of 4 mm. No peripancreatic inflammation or evidence of focal pancreatic abnormality. Spleen: Normal in size without focal abnormality. Adrenals/Urinary Tract: No adrenal nodule. There are  bilateral nonobstructing intrarenal calculi. Mild right renal atrophy is chronic. Small left renal cyst. No further follow-up imaging is recommended. There is narrowing of the left renal vein as it courses under the SMA with perihilar and retroperitoneal venous collaterals. Partially distended urinary bladder, grossly normal for degree of distension. Stomach/Bowel: Bowel assessment is limited in the absence of enteric contrast and paucity of intra-abdominal fat. Mild pre pyloric gastric wall thickening series 2, image 67. No perigastric fat stranding or inflammation. There is no small bowel obstruction. Occasional fluid-filled small bowel in the lower abdomen and pelvis without wall thickening or inflammatory change. Small to moderate volume of stool in the colon. Wall thickening versus nondistention of the transverse colon. There is wall thickening of the distal sigmoid colon and rectum. Poorly defined fat plane between the rectum in the uterus. Vascular/Lymphatic: Aortic atherosclerosis without aneurysm. Portal and splenic veins are patent. There is narrowing of the left renal vein as it courses under the SMA. Left perihilar and retroperitoneal venous collaterals. No discrete nodal enlargement. Reproductive: Poorly defined fat plane between the uterus and rectum as before. 3.9 cm rounded fluid density structure in the posterior left pelvis, series 2, image 103. This may represent loculated ascites, has increased in size from prior exam. Other: Omental studding and nodularity anteriorly, in the pelvis and left pericolic gutter. Some of these have increased from prior exam. Reference pericolic gutter nodule measuring 15 mm series 2, image 83, previously 11 mm. 16 mm pericolic gutter deposit series 2, image 77, previously 17 mm. Right anterior omental nodularity measuring 25 mm, series 2, image 92, previously 17 mm. No abdominal free fluid. No free air or focal fluid collection. Musculoskeletal: No lytic or  destructive bone lesion. No acute osseous findings. Mild scoliotic curvature of the spine. 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 IMPRESSION: 1. No acute intrathoracic findings or evidence of metastatic disease in the chest. 2. Findings consistent with metastatic rectal cancer in the abdomen. Slight increase in omental nodularity over the past month. 3. The previous left lobe liver lesion is not definitively seen on prior exam. Biliary stents remain in place with stable intrahepatic biliary ductal dilatation. 4. Bilateral nonobstructing intrarenal calculi. 5. Narrowing of the left renal vein as it courses under the SMA with retroperitoneal and perihilar collaterals. 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 6. Wall thickening of the distal stomach, also seen on prior exam. This may represent gastritis or peptic ulcer disease. Wall thickening versus nondistention in the transverse colon. 7.  Aortic Atherosclerosis (ICD10-I70.0). Electronically Signed   By: Andrea Gasman M.D.   On: 01/24/2023 16:57     Labs:   Basic Metabolic Panel: Recent Labs  Lab 01/24/23 1444 01/25/23 0440 01/26/23 0440 01/27/23 0500  NA 132* 137 135 133*  K 2.5* 3.1* 3.3* 3.4*  CL 93* 101 101 99  CO2 30 31 28 27   GLUCOSE 120* 87 84 88  BUN 15 7 5* 7  CREATININE 0.46 0.49 <0.30* 0.48  CALCIUM  8.9 8.3* 8.5* 8.6*  MG 1.8  --  1.8  1.7   GFR Estimated Creatinine Clearance: 73.5 mL/min (by C-G formula based on SCr of 0.48 mg/dL). Liver Function Tests: Recent Labs  Lab 01/24/23 1444 01/25/23 0440  AST 24 22  ALT 21 19  ALKPHOS 408* 342*  BILITOT 0.4 0.4  PROT 8.2* 6.5  ALBUMIN 3.7 2.9*   No results for input(s): LIPASE, AMYLASE in the last 168 hours. No results for input(s): AMMONIA in the last 168 hours. Coagulation profile No results for input(s): INR, PROTIME in the last 168 hours.  CBC: Recent Labs  Lab 01/24/23 1444 01/25/23 0440  01/26/23 0440 01/27/23 0500  WBC 7.7 7.9 7.7 7.5  NEUTROABS 3.5  --   --   --   HGB 12.3 10.5* 10.0* 10.7*  HCT 36.7 32.3* 30.0* 32.4*  MCV 90.6 93.1 91.2 91.8  PLT 356 299 271 303   Cardiac Enzymes: No results for input(s): CKTOTAL, CKMB, CKMBINDEX, TROPONINI in the last 168 hours. BNP: Invalid input(s): POCBNP CBG: No results for input(s): GLUCAP in the last 168 hours. D-Dimer No results for input(s): DDIMER in the last 72 hours. Hgb A1c No results for input(s): HGBA1C in the last 72 hours. Lipid Profile No results for input(s): CHOL, HDL, LDLCALC, TRIG, CHOLHDL, LDLDIRECT in the last 72 hours. Thyroid function studies No results for input(s): TSH, T4TOTAL, T3FREE, THYROIDAB in the last 72 hours.  Invalid input(s): FREET3 Anemia work up No results for input(s): VITAMINB12, FOLATE, FERRITIN, TIBC, IRON, RETICCTPCT in the last 72 hours. Microbiology Recent Results (from the past 240 hours)  Urine Culture     Status: Abnormal   Collection Time: 01/20/23  8:38 AM   Specimen: Urine, Clean Catch  Result Value Ref Range Status   Specimen Description   Final    URINE, CLEAN CATCH Performed at [facility], [address], [place], [place] [address]    Special Requests   Final    NONE Performed at [facility], [address] [place], [place] [address]    Culture MULTIPLE SPECIES PRESENT, SUGGEST RECOLLECTION (A)  Final   Report Status 01/21/2023 FINAL  Final  C Difficile Quick Screen w PCR reflex     Status: Abnormal   Collection Time: 01/25/23 12:22 PM   Specimen: STOOL  Result Value Ref Range Status   C Diff antigen POSITIVE (A) NEGATIVE Final   C Diff toxin NEGATIVE NEGATIVE Final   C Diff interpretation Results are indeterminate. See PCR results.  Final    Comment: VALID Performed at [facility], [address], [place], [place] [address]   C. Diff by PCR,  Reflexed     Status: None   Collection Time: 01/25/23 12:22 PM  Result Value Ref Range Status   Toxigenic C. Difficile by PCR NEGATIVE NEGATIVE Final    Comment: Patient is colonized with non toxigenic C. difficile. May not need treatment unless significant symptoms are present. Performed at [facility], [address], [place], [place] [address]      Discharge Instructions:   Discharge Instructions     Call MD for:  persistant nausea and vomiting   Complete by: As directed    Call MD for:  temperature >100.4   Complete by: As directed    Diet general   Complete by: As directed    Discharge instructions   Complete by: As directed    Follow-up with your primary care provider in 1 week.  Check blood work at that time.  Increase hydration.  Seek medical attention for worsening symptoms.   Increase activity slowly   Complete by: As directed       Allergies as of 01/27/2023       Reactions   Lisinopril Swelling, Other (See Comments)   Lip swelling and Angioedema   Irinotecan  Other (See Comments)   Stomach cramping Patient given 0.5 mL atropine  IV and was able to continue infusion.  See Progress note on 10/03/20.    Leucovorin  Calcium  Itching   Patient reported itchy palms towards end of transfusion, see progress note 08/04/22.   Oxaliplatin  Itching   Patient reported itchy palms towards end of transfusion, see progress note on 08/04/22.        Medication List     PAUSE taking these medications    chlorthalidone  25 MG tablet Wait to take this until: January 29, 2023 Morning Commonly known as: HYGROTON  Take 1 tablet (25 mg total) by mouth daily.       STOP taking these medications    linaclotide  72 MCG capsule Commonly known as: Linzess    polyethylene glycol powder 17 GM/SCOOP powder Commonly known as: MiraLax        TAKE these medications    amLODipine  10 MG tablet Commonly known as: NORVASC  Take 1 tablet (10 mg total) by mouth daily.    dicyclomine  20 MG tablet Commonly known as: BENTYL  Take 1 tablet (20 mg total) by mouth 3 (three) times daily as needed for spasms (rectal/AB pain).   HYDROcodone -acetaminophen  10-325 MG tablet Commonly known as: NORCO Take 1 tablet by mouth every 4 (four) hours as needed for up to 5 days for moderate pain (pain score 4-6) or severe pain (pain score 7-10).   lactulose  10 GM/15ML solution Commonly known as: CHRONULAC  Take 15-30 mLs by mouth daily as needed for mild constipation.   metoprolol  tartrate 50 MG tablet Commonly known as: LOPRESSOR  Take 1 tablet (50 mg total) by mouth 2 (two) times daily.   morphine  30 MG 12 hr tablet Commonly known as: MS CONTIN  Take 1 tablet (30 mg total) by mouth every 12 (twelve) hours.   naproxen sodium 220 MG tablet Commonly known as: ALEVE Take 440 mg by mouth daily as needed.   pantoprazole  40 MG tablet Commonly known as: PROTONIX  Take 1 tablet (40 mg total) by mouth daily.   potassium chloride  10 MEQ CR capsule Commonly known as: MICRO-K  Take 2 capsules (20 mEq total) by mouth 2 (two) times daily.   prochlorperazine  10 MG tablet Commonly known as: COMPAZINE  Take 1 tablet (10 mg total) by mouth every 4 (four) hours as needed for nausea or vomiting. What changed: when to take this        Follow-up Information     Early, Camie BRAVO, NP Follow up in 1 week(s).   Specialty: Nurse Practitioner Contact information: [address] [place] [address] [phone]                  Time coordinating discharge: 39 minutes  Signed:  Iraida Cragin  [facility] 01/27/2023, 10:24 AM

## 2023-01-27 NOTE — Plan of Care (Signed)

## 2023-01-27 NOTE — Progress Notes (Signed)
 AVS reviewed w/pt at 1020- Marillyn verbalized an understanding. No other questions. Pt has a PICC line - IV team consult placed at 1025. Pt dresed for d/c to home. TOC meds delivered in a secure bag to pt in room. Ride here in room.

## 2023-01-29 ENCOUNTER — Encounter (HOSPITAL_COMMUNITY): Payer: Self-pay | Admitting: Gastroenterology

## 2023-01-30 ENCOUNTER — Telehealth: Payer: Self-pay

## 2023-01-30 NOTE — Telephone Encounter (Signed)
Left message for patient to call back to discuss that  Dr. Meridee Score will not be able to perform the procedures for you on Monday 02/02/23 due to the birth of his baby.

## 2023-01-30 NOTE — Telephone Encounter (Signed)
Pt has been rescheduled for for ERCP to 02/26/23 at 8 am  Left message on machine to call back

## 2023-02-01 ENCOUNTER — Other Ambulatory Visit: Payer: Self-pay | Admitting: Oncology

## 2023-02-02 ENCOUNTER — Other Ambulatory Visit (HOSPITAL_BASED_OUTPATIENT_CLINIC_OR_DEPARTMENT_OTHER): Payer: Self-pay

## 2023-02-02 NOTE — Telephone Encounter (Signed)
ERCP re- scheduled, pt instructed and medications reviewed.  Patient instructions mailed to home.  Patient to call with any questions or concerns.

## 2023-02-03 ENCOUNTER — Other Ambulatory Visit (HOSPITAL_BASED_OUTPATIENT_CLINIC_OR_DEPARTMENT_OTHER): Payer: Self-pay

## 2023-02-03 ENCOUNTER — Other Ambulatory Visit: Payer: Self-pay

## 2023-02-03 ENCOUNTER — Encounter: Payer: Self-pay | Admitting: Oncology

## 2023-02-03 ENCOUNTER — Inpatient Hospital Stay: Payer: 59

## 2023-02-03 ENCOUNTER — Other Ambulatory Visit: Payer: Self-pay | Admitting: Nurse Practitioner

## 2023-02-03 ENCOUNTER — Encounter: Payer: Self-pay | Admitting: Nurse Practitioner

## 2023-02-03 ENCOUNTER — Inpatient Hospital Stay (HOSPITAL_BASED_OUTPATIENT_CLINIC_OR_DEPARTMENT_OTHER): Payer: 59 | Admitting: Nurse Practitioner

## 2023-02-03 VITALS — BP 142/88 | HR 66 | Resp 18

## 2023-02-03 VITALS — BP 136/82 | HR 79 | Temp 97.7°F | Resp 18 | Ht 64.0 in | Wt 135.9 lb

## 2023-02-03 DIAGNOSIS — C19 Malignant neoplasm of rectosigmoid junction: Secondary | ICD-10-CM

## 2023-02-03 DIAGNOSIS — E876 Hypokalemia: Secondary | ICD-10-CM

## 2023-02-03 DIAGNOSIS — Z5111 Encounter for antineoplastic chemotherapy: Secondary | ICD-10-CM | POA: Diagnosis not present

## 2023-02-03 DIAGNOSIS — C187 Malignant neoplasm of sigmoid colon: Secondary | ICD-10-CM | POA: Diagnosis present

## 2023-02-03 DIAGNOSIS — Z452 Encounter for adjustment and management of vascular access device: Secondary | ICD-10-CM | POA: Diagnosis not present

## 2023-02-03 DIAGNOSIS — R112 Nausea with vomiting, unspecified: Secondary | ICD-10-CM | POA: Diagnosis not present

## 2023-02-03 DIAGNOSIS — C786 Secondary malignant neoplasm of retroperitoneum and peritoneum: Secondary | ICD-10-CM | POA: Diagnosis not present

## 2023-02-03 DIAGNOSIS — Z79899 Other long term (current) drug therapy: Secondary | ICD-10-CM | POA: Diagnosis not present

## 2023-02-03 DIAGNOSIS — Z5112 Encounter for antineoplastic immunotherapy: Secondary | ICD-10-CM | POA: Diagnosis present

## 2023-02-03 DIAGNOSIS — R197 Diarrhea, unspecified: Secondary | ICD-10-CM | POA: Diagnosis not present

## 2023-02-03 LAB — CMP (CANCER CENTER ONLY)
ALT: 28 U/L (ref 0–44)
AST: 35 U/L (ref 15–41)
Albumin: 3.9 g/dL (ref 3.5–5.0)
Alkaline Phosphatase: 453 U/L — ABNORMAL HIGH (ref 38–126)
Anion gap: 7 (ref 5–15)
BUN: 5 mg/dL — ABNORMAL LOW (ref 6–20)
CO2: 30 mmol/L (ref 22–32)
Calcium: 9.5 mg/dL (ref 8.9–10.3)
Chloride: 101 mmol/L (ref 98–111)
Creatinine: 0.37 mg/dL — ABNORMAL LOW (ref 0.44–1.00)
GFR, Estimated: >60 mL/min (ref >60)
Glucose, Bld: 87 mg/dL (ref 70–99)
Potassium: 3.2 mmol/L — ABNORMAL LOW (ref 3.5–5.1)
Sodium: 138 mmol/L (ref 135–145)
Total Bilirubin: 0.3 mg/dL (ref 0.0–1.2)
Total Protein: 7.2 g/dL (ref 6.5–8.1)

## 2023-02-03 LAB — CBC WITH DIFFERENTIAL (CANCER CENTER ONLY)
Abs Immature Granulocytes: 0.03 10*3/uL (ref 0.00–0.07)
Basophils Absolute: 0.1 10*3/uL (ref 0.0–0.1)
Basophils Relative: 1 %
Eosinophils Absolute: 0.5 10*3/uL (ref 0.0–0.5)
Eosinophils Relative: 6 %
HCT: 33 % — ABNORMAL LOW (ref 36.0–46.0)
Hemoglobin: 11 g/dL — ABNORMAL LOW (ref 12.0–15.0)
Immature Granulocytes: 0 %
Lymphocytes Relative: 30 %
Lymphs Abs: 2.3 10*3/uL (ref 0.7–4.0)
MCH: 30.1 pg (ref 26.0–34.0)
MCHC: 33.3 g/dL (ref 30.0–36.0)
MCV: 90.4 fL (ref 80.0–100.0)
Monocytes Absolute: 0.5 10*3/uL (ref 0.1–1.0)
Monocytes Relative: 7 %
Neutro Abs: 4.3 10*3/uL (ref 1.7–7.7)
Neutrophils Relative %: 56 %
Platelet Count: 330 10*3/uL (ref 150–400)
RBC: 3.65 MIL/uL — ABNORMAL LOW (ref 3.87–5.11)
RDW: 15 % (ref 11.5–15.5)
WBC Count: 7.6 10*3/uL (ref 4.0–10.5)
nRBC: 0 % (ref 0.0–0.2)

## 2023-02-03 LAB — MAGNESIUM: Magnesium: 1.7 mg/dL (ref 1.7–2.4)

## 2023-02-03 MED ORDER — PALONOSETRON HCL INJECTION 0.25 MG/5ML
0.2500 mg | Freq: Once | INTRAVENOUS | Status: AC
  Administered 2023-02-03: 0.25 mg via INTRAVENOUS
  Filled 2023-02-03: qty 5

## 2023-02-03 MED ORDER — DEXAMETHASONE 4 MG PO TABS
ORAL_TABLET | ORAL | 2 refills | Status: DC
  Filled 2023-02-03: qty 12, 6d supply, fill #0
  Filled 2023-02-16: qty 12, 6d supply, fill #1

## 2023-02-03 MED ORDER — ATROPINE SULFATE 1 MG/ML IV SOLN
0.5000 mg | Freq: Once | INTRAVENOUS | Status: AC | PRN
  Administered 2023-02-03: 0.5 mg via INTRAVENOUS
  Filled 2023-02-03: qty 1

## 2023-02-03 MED ORDER — HEPARIN SOD (PORK) LOCK FLUSH 100 UNIT/ML IV SOLN
250.0000 [IU] | Freq: Once | INTRAVENOUS | Status: AC | PRN
  Administered 2023-02-03: 250 [IU]

## 2023-02-03 MED ORDER — NYSTATIN 100000 UNIT/ML MT SUSP
5.0000 mL | Freq: Four times a day (QID) | OROMUCOSAL | 1 refills | Status: DC | PRN
  Filled 2023-02-03: qty 240, 12d supply, fill #0

## 2023-02-03 MED ORDER — SODIUM CHLORIDE 0.9 % IV SOLN
125.0000 mg/m2 | Freq: Once | INTRAVENOUS | Status: AC
  Administered 2023-02-03: 200 mg via INTRAVENOUS
  Filled 2023-02-03: qty 10

## 2023-02-03 MED ORDER — POTASSIUM CHLORIDE 10 MEQ/100ML IV SOLN
10.0000 meq | INTRAVENOUS | Status: AC
  Administered 2023-02-03 (×2): 10 meq via INTRAVENOUS
  Filled 2023-02-03: qty 100

## 2023-02-03 MED ORDER — HYDROCODONE-ACETAMINOPHEN 10-325 MG PO TABS
1.0000 | ORAL_TABLET | ORAL | 0 refills | Status: DC | PRN
  Filled 2023-02-03: qty 60, 10d supply, fill #0

## 2023-02-03 MED ORDER — SODIUM CHLORIDE 0.9 % IV SOLN: INTRAVENOUS | Status: DC

## 2023-02-03 MED ORDER — PROCHLORPERAZINE MALEATE 10 MG PO TABS
10.0000 mg | ORAL_TABLET | Freq: Four times a day (QID) | ORAL | 2 refills | Status: DC | PRN
  Filled 2023-02-03: qty 30, 10d supply, fill #0

## 2023-02-03 MED ORDER — SODIUM CHLORIDE 0.9 % IV SOLN
6.0000 mg/kg | Freq: Once | INTRAVENOUS | Status: AC
  Administered 2023-02-03: 400 mg via INTRAVENOUS
  Filled 2023-02-03: qty 20

## 2023-02-03 MED ORDER — DEXAMETHASONE SODIUM PHOSPHATE 10 MG/ML IJ SOLN
10.0000 mg | Freq: Once | INTRAMUSCULAR | Status: AC
  Administered 2023-02-03: 10 mg via INTRAVENOUS
  Filled 2023-02-03: qty 1

## 2023-02-03 MED ORDER — SODIUM CHLORIDE 0.9 % IV SOLN
150.0000 mg | Freq: Once | INTRAVENOUS | Status: AC
  Administered 2023-02-03: 150 mg via INTRAVENOUS
  Filled 2023-02-03: qty 150

## 2023-02-03 MED ORDER — SODIUM CHLORIDE 0.9% FLUSH
10.0000 mL | INTRAVENOUS | Status: DC | PRN
Start: 2023-02-03 — End: 2023-02-03
  Administered 2023-02-03: 10 mL

## 2023-02-03 MED ORDER — ONDANSETRON HCL 8 MG PO TABS
8.0000 mg | ORAL_TABLET | Freq: Three times a day (TID) | ORAL | 1 refills | Status: DC | PRN
  Filled 2023-02-03: qty 30, 10d supply, fill #0

## 2023-02-03 NOTE — Progress Notes (Signed)
[facility] OFFICE PROGRESS NOTE   Diagnosis: Colon cancer  INTERVAL HISTORY:   April [name] returns as scheduled.  She completed cycle 2 irinotecan/Panitumumab 01/20/2023.  She was hospitalized 01/24/2023 through 01/27/2023 presenting with nausea/vomiting, diarrhea.  She ran out of Compazine.  She took dexamethasone twice a day for 1 day, then ran out.  She denies significant diarrhea.  She reports a mouth sore on the side of her tongue.  No rash.  Objective:  Vital signs in last 24 hours:  Blood pressure 136/82, pulse 79, temperature 97.7 F (36.5 C), temperature source Temporal, resp. rate 18, height 5\' 4"  (1.626 m), weight 135 lb 14.4 oz (61.6 kg), last menstrual period 01/07/2006, SpO2 100%.    HEENT: Healing ulceration left lateral tongue.  No thrush. Resp: Lungs clear bilaterally. Cardio: Regular rate and rhythm. GI: No hepatosplenomegaly.  Tender across the upper mid abdomen. Vascular: No leg edema. Skin: No rash. PICC without erythema.  Lab Results:  Lab Results  Component Value Date   WBC 7.6 02/03/2023   HGB 11.0 (L) 02/03/2023   HCT 33.0 (L) 02/03/2023   MCV 90.4 02/03/2023   PLT 330 02/03/2023   NEUTROABS 4.3 02/03/2023    Imaging:  No results found.  Medications: I have reviewed the patient's current medications.  Assessment/Plan: Sigmoid colon cancer, stage IV (pT4a,pN2b,M1c) Colonoscopy 03/24/2019-3 rectal polyps-hyperplastic polyps, distal colon biopsy-at least intramucosal adenocarcinoma, completely obstructing mass in the distal sigmoid colon, could not be traversed, intact mismatch repair protein expression 03/24/2019-CEA 56.1 03/29/2019 CT abdomen/pelvis-circumferential thickening involving the entire mid and distal sigmoid colon to the level of the rectosigmoid junction, solid/cystic mass in the left ovary, bilateral nephrolithiasis Robotic assisted low anterior resection, mesenteric lymphadenectomy, bilateral salpingo-oophorectomy  04/08/2019 Pathology ([facility] review of outside pathology) metastatic adenocarcinoma involving the peritoneum overlying the round ligament, serosa of the urinary bladder, serosa of the right ureter a sacral area, pelvic peritoneum, and left ovary.  Omental biopsy with focal mucin pools with no carcinoma cells identified, right hemidiaphragm biopsy involved by metastatic adenocarcinoma, invasive adenocarcinoma the sigmoid colon, moderately differentiated, T4a, perineural and vascular invasion present, 7/22 lymph nodes, multiple tumor deposits, resection margins negative Negative for PD-L1, low probability of MSI-high, HER-2 negative, negative for BRAF, NRAS and KRAS alterations CTs 05/19/2019-no evidence of metastatic disease, findings suspicious for colitis of the transverse and ascending colon, small amount of ascites in the cul-de-sac, bilateral renal calculi Cycle 1 FOLFIRI 05/24/2019, bevacizumab added with cycle 2 Cycle 4 FOLFIRI/bevacizumab 07/05/2019 Cycle 5 FOLFIRI 07/27/2019, bevacizumab held secondary to hypertension Cycle 6 FOLFIRI 08/10/1999, bevacizumab held secondary to hypertension CTs 08/30/2019-no evidence of recurrent disease, new subsolid right lower lobe nodule felt to be inflammatory, emphysema Cycle 7 FOLFIRI 09/26/2019, bevacizumab remains on hold secondary to hypertension Cycle 8 FOLFIRI 10/17/2019, bevacizumab held secondary to hypertension, Udenyca added for neutropenia Cycle 9 FOLFIRI 11/07/2019, bevacizumab held secondary to hypertension, Udenyca Cycle 10 FOLFIRI 11/28/2019, bevacizumab held, Udenyca Cycle 11 FOLFIRI 12/26/2019, bevacizumab held, Udenyca CTs 01/25/2020-no evidence of recurrent disease, resolution of right lower lobe nodule Maintenance Xeloda beginning 02/06/2020 CTs 04/25/2020- no evidence of metastatic disease, multiple bilateral renal calculi without hydronephrosis Maintenance Xeloda continued CT abdomen/pelvis without contrast 08/10/2020-bilateral staghorn renal  calculi, no evidence of metastatic disease; addendum 08/23/2020-small but increasing omental nodules. Cycle 1 FOLFIRI/panitumumab 09/19/2020 Cycle 2 FOLFIRI/Panitumumab 10/03/2020 Cycle 3 FOLFIRI/Panitumumab 10/17/2020 Cycle 4 FOLFIRI/panitumumab 10/31/2020 Cycle 5 FOLFIRI/Panitumumab 11/14/2020 CTs 11/26/2020-stable omental metastases compared to 10/22/2020, mildly decreased from 08/10/2020.  Left lower quadrant tiny paracolic  gutter implant slightly decreased from CT 08/10/2020.  No new or progressive metastatic disease in the abdomen or pelvis. Cycle 6 FOLFIRI/Panitumumab 11/28/2020, irinotecan dose reduced, treatment schedule adjusted to every 3 weeks going forward Cycle 7 FOLFIRI/panitumumab 12/24/2020 Cycle 8 FOLFIRI/Panitumumab 01/16/2021--treatment held due to hypertension in the infusion area. Cycle 8 FOLFIRI/panitumumab 01/21/2021 Cycle 9 FOLFIRI/panitumumab 02/11/2021 Cycle 10 FOLFIRI/Panitumumab 03/04/2021 Cycle 11 FOLFIRI/Panitumumab 03/25/2021 CT abdomen/pelvis 04/10/2021-stable right omental and left posterior paracolic gutter nodules Cycle 12 FOLFIRI/panitumumab 04/15/2021 Cycle 13 FOLFIRI/panitumumab 05/06/2021 Cycle 14 FOLFIRI/Panitumumab 05/27/2021 Cycle 15 FOLFIRI/Panitumumab 06/17/2021 CT abdomen/pelvis 07/05/2021-slight enlargement of a dominant right omental implant, no new implants Patient requested a treatment break CT abdomen/pelvis 10/04/2021-mild increase in size of multiple omental soft tissue nodules, 4 mm calculus in the distal left ureter adjacent to the ureteral stent Cycle 1 Lonsurf 10/14/2021 10/28/2021 Avastin every 2 weeks Cycle 2 Lonsurf 11/11/2021 Cycle 3 Lonsurf 12/09/2021 Cycle 4 Lonsurf 01/06/2022 Avastin held 01/20/2022 due to proteinuria, 24-hour urine 43 mg CT/pelvis 01/30/2022-possible new peritoneal implant at the transverse colon, no significant change in other omental/peritoneal implants, stable chronic rectal wall thickening, status post removal of double-J left  ureteral stent with no evidence of hydronephrosis or ureteral calculus, nonobstructing bilateral renal calculi Cycle 5 Lonsurf 02/05/2022 or 02/06/2022 Avastin every 2 weeks Cycle 6 Lonsurf 03/03/2022 CTs 04/05/2022-increase in size and number of peritoneal implants compared to January 2024 central liver mass narrowing the anterior branch of the right portal vein, decreased biliary duct dilation, biliary stents in place, stable right cardiophrenic lymph nodes, separate pigtail stent in the lumen of the proximal duodenum Cycle 1 FOLFOX 04/14/2022 CT abdomen/pelvis 04/16/2022-stent within the third portion of the duodenum, stable peritoneal nodules, indwelling biliary stents with mild left biliary duct dilation Cycle 2 FOLFOX 04/28/2022, Emend and prophylactic dexamethasone added Cycle 3 FOLFOX 05/12/2022, Aloxi, Emend, prophylactic dexamethasone, Compazine and lorazepam as needed Chemotherapy held 05/25/2020 due to severe hypertension, evaluated in the emergency department Cycle 4 FOLFOX 06/03/2022 Cycle 5 FOLFOX 06/16/2022, oxaliplatin dose reduced and 5-FU bolus eliminated due to neutropenia CT 06/25/2022-unchanged soft tissue at the hepatic hilum, unchanged, bile duct stent, decreased size of peritoneal and omental nodules and diminished volume of likely fluid in the lower left pelvis Cycle 6 FOLFOX 06/30/2022 Cycle 7 FOLFOX 07/21/2022 CT 07/24/2022 (in the emergency department with nausea, headache, abdominal pain)-no acute pathology.  No significant interval change in omental nodularity. Cycle 8 FOLFOX 08/04/2022, pruritus on the palms at the end of the oxaliplatin infusion, Pepcid and Benadryl given, symptoms resolved, infusion not resumed Cycle 9 FOLFOX 08/18/2022-Pepcid and Benadryl added to premedications, Oxaliplatin diluted in a larger volume and infusion time increased Cycle 10 FOLFOX 09/08/2022 Cycle 11 FOLFOX 09/22/2022 10/06/2022 patient declined treatment CTs 10/15/2022-similar soft tissue fullness in the  hepatic hilum with biliary stents in place, decrease in omental/peritoneal metastases Maintenance capecitabine 10/26/2022 CT abdomen/pelvis 11/19/2022-mild increase in tumor studding at the left paracolic gutter and omentum, circumferential wall thickening of the rectum, biliary stents in place with minimal increase in intrahepatic biliary dilation, nonobstructive renal calculi Colonoscopy 11/23/2022-anastomosis at 15 cm from the anal verge-patent, 2 cm polypoid tissue at the anastomosis biopsied, invasive adenocarcinoma moderately differentiated, preserved expression of major MMR proteins CTs 12/15/2022: Possible developing nodule in the lateral left liver, increased omental nodularity, persistent wall thickening in the upper rectum CT abdomen/pelvis 12/24/2022: Increased thickened fold in the transverse colon-infectious/inflammatory, 2.1 cm lateral left liver lesion, potential new 5 mm segment 5 lesion, stable biliary stents with dramatic biliary dilation, central biliary wall  thickening near the hepatic hilum, multiple peritoneal/omental implants unchanged from 12/15/2022, but likely larger than 11/19/2022 Cycle 1 irinotecan/Panitumumab 01/06/2023 Cycle 2 irinotecan/Panitumumab 01/20/2023 Cycle 3 irinotecan/Panitumumab 02/03/2023   Hypertension G4 P3, twins Kidney stones-bilateral staghorn renal calculi on CT 08/10/2020 Infected Port-A-Cath 09/12/2019-placed on Augmentin, referred for Port-A-Cath removal; Port-A-Cath removed 09/14/2019; PICC line placed 09/14/2019; culture staph aureus.  Course of Septra completed. Neutropenia secondary to chemotherapy-Udenyca added with cycle 8 FOLFIRI Right nephrostomy tube 09/12/2020; stent placed 10/09/2020, percutaneous nephrostolithotomy treatment of right-sided kidney stones 10/09/2020, malpositioned right ureter stent replacement 10/23/2020, right ureter stent removed 10/29/2020 Percutaneous left nephrostolithotomy 10/01/2021-no renal stone identified, left ureter stent  left in place Cystoscopy 10/14/2021-stent removed 8.  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  Admission 04/16/2022 with nausea/vomiting and abdominal pain, felt to be acute toxicity related to chemotherapy, discharged home 04/18/2022 10.  Admission 11/19/2022 with nausea/vomiting and abdominal pain-improved 11.  Admission 12/15/2022 with fever and right submandibular swelling/tenderness CT neck 12/15/2022: 2 mm distal right submandibular duct calculus with mild inflammatory change adjacent to the right submandibular gland 12.  Admission 12/24/2022 with a fever nausea/vomiting, diarrhea, Klebsiella bacteremia completed course of Augmentin    Disposition: April [name] appears stable.  She has completed 2 cycles of irinotecan/Panitumumab.  She was hospitalized following cycle 2 with refractory nausea/vomiting.  We will add Emend to the premedications today.  She understands the risk of allergic reaction with Emend.  She will begin dexamethasone 4 mg twice daily for 3 days on 02/04/2023.  New prescriptions were sent to her pharmacy for Compazine, Zofran, dexamethasone.  She understands she cannot begin Zofran for 72 hours after today's treatment.  She will come in for IV fluids 02/04/2023 and 02/05/2023.  She agrees with the above plan.  Plan to proceed with cycle 3 today as scheduled.  CBC and chemistry panel reviewed.  Labs adequate to proceed as above.  She has mild hypokalemia.  She will receive potassium 20 mEq IV today.   She will continue home potassium.  She has a single mouth sore.  We will send Magic mouthwash to her pharmacy.  She will return for follow-up and treatment in 2 weeks.  We are available to see her sooner if needed.    Lonna Cobb ANP/GNP-BC   02/03/2023  9:00 AM

## 2023-02-03 NOTE — Progress Notes (Signed)
Patient seen by Lonna Cobb NP today  Vitals are within treatment parameters:Yes   Labs are within treatment parameters: No (Please specify and give further instructions.) k + 3.2 , Alkaline Phos. [phone]  Treatment plan has been signed: Yes   Per physician team, Patient is ready for treatment. Please note the following modifications: adding emend and K+ IV

## 2023-02-03 NOTE — Patient Instructions (Signed)
PICC Home Care Guide A peripherally inserted central catheter (PICC) is a form of IV access that allows medicines and IV fluids to be quickly put into the blood and spread throughout the body. The PICC is a long, thin, flexible tube (catheter) that is put into a vein in a person's arm or leg. The catheter ends in a large vein just outside the heart called the superior vena cava (SVC). After the PICC is put in, a chest X-ray may be done to make sure that it is in the right place. A PICC may be placed for different reasons, such as: To give medicines and liquid nutrition. To give IV fluids and blood products. To take blood samples often. If there is trouble placing a peripheral intravenous (PIV) catheter. If cared for properly, a PICC can remain in place for many months. Having a PICC can allow you to go home from the hospital sooner and continue treatment at home. Medicines and PICC care can be managed at home by a family member, caregiver, or home health care team. What are the risks? Generally, having a PICC is safe. However, problems may occur, including: A blood clot (thrombus) forming in or at the end of the PICC. A blood clot forming in a vein (deep vein thrombosis) or traveling to the lung (pulmonary embolism). Inflammation of the vein (phlebitis) in which the PICC is placed. Infection at the insertion site or in the blood. Blood infections from central lines, like PICCs, can be serious and often require a hospital stay. PICC malposition, or PICC movement or poor placement. A break or cut in the PICC. Do not use scissors near the PICC. Nerve or tendon irritation or injury during PICC insertion. How to care for your PICC Please follow the specific guidelines provided by your health care provider. Preventing infection You and any caregivers should wash your hands often with soap and water for at least 20 seconds. Wash hands: Before touching the PICC or the infusion device. Before changing a  bandage (dressing). Do not change the dressing unless you have been taught to do so and have shown you are able to change it safely. Flush the PICC as told. Tell your health care provider right away if the PICC is hard to flush or does not flush. Do not use force to flush the PICC. Use clean and germ-free (sterile) supplies only. Keep the supplies in a dry place. Do not reuse needles, syringes, or any other supplies. Reusing supplies can lead to infection. Keep the PICC dressing dry and secure it with tape if the edges stop sticking to your skin. Check your PICC insertion site every day for signs of infection. Check for: Redness, swelling, or pain. Fluid or blood. Warmth. Pus or a bad smell. Preventing other problems Do not use a syringe that is less than 10 mL to flush the PICC. Do not have your blood pressure checked on the arm in which the PICC is placed. Do not ever pull or tug on the PICC. Keep it secured to your arm with tape or a stretch wrap when not in use. Do not take the PICC out yourself. Only a trained health care provider should remove the PICC. Keep pets and children away from your PICC. How to care for your PICC dressing Keep your PICC dressing clean and dry to prevent infection. Do not take baths, swim, or use a hot tub until your health care provider approves. Ask your health care provider if you can take  showers. You may only be allowed to take sponge baths. When you are allowed to shower: Ask your health care provider to teach you how to wrap the PICC. Cover the PICC with clear plastic wrap and tape to keep it dry while showering. Follow instructions from your health care provider about how to take care of your insertion site and dressing. Make sure you: Wash your hands with soap and water for at least 20 seconds before and after you change your dressing. If soap and water are not available, use hand sanitizer. Change your dressing only if taught to do so by your health care  provider. Your PICC dressing needs to be changed if it becomes loose or wet. Leave stitches (sutures), skin glue, or adhesive strips in place. These skin closures may need to stay in place for 2 weeks or longer. If adhesive strip edges start to loosen and curl up, you may trim the loose edges. Do not remove adhesive strips completely unless your health care provider tells you to do that. Follow these instructions at home: Disposal of supplies Throw away any syringes in a disposal container that is meant for sharp items (sharps container). You can buy a sharps container from a pharmacy, or you can make one by using an empty, hard plastic bottle with a lid. Place any used dressings or infusion bags into a plastic bag. Throw that bag in the trash. General instructions  Always carry your PICC identification card or wear a medical alert bracelet. Keep the tube clamped at all times, unless it is being used. Always carry a smooth-edge clamp with you to clamp the PICC if it breaks. Do not use scissors or sharp objects near the tube. You may bend your arm and move it freely. If your PICC is near or at the bend of your elbow, avoid activity with repeated motion at the elbow. Avoid lifting heavy objects as told by your health care provider. Keep all follow-up visits. This is important. You will need to have your PICC dressing changed at least once a week. Contact a health care provider if: You have pain in your arm, ear, face, or teeth. You have a fever or chills. You have redness, swelling, or pain around the insertion site. You have fluid or blood coming from the insertion site. Your insertion site feels warm to the touch. You have pus or a bad smell coming from the insertion site. Your skin feels hard and raised around the insertion site. Your PICC dressing has gotten wet or is coming off and you have not been taught how to change it. Get help right away if: You have problems with your PICC, such as  your PICC: Was tugged or pulled and has partially come out. Do not  push the PICC back in. Cannot be flushed, is hard to flush, or leaks around the insertion site when it is flushed. Makes a flushing sound when it is flushed. Appears to have a hole or tear. Is accidentally pulled all the way out. If this happens, cover the insertion site with a gauze dressing. Do not throw the PICC away. Your health care provider will need to check it to be sure the entire catheter came out. You feel your heart racing or skipping beats, or you have chest pain. You have shortness of breath or trouble breathing. You have swelling, redness, warmth, or pain in the arm in which the PICC is placed. You have a red streak going up your arm that  starts under the PICC dressing. These symptoms may be an emergency. Get help right away. Call 911. Do not wait to see if the symptoms will go away. Do not drive yourself to the hospital. Summary A peripherally inserted central catheter (PICC) is a long, thin, flexible tube (catheter) that is put into a vein in the arm or leg. If cared for properly, a PICC can remain in place for many months. Having a PICC can allow you to go home from the hospital sooner and continue treatment at home. The PICC is inserted using a germ-free (sterile) technique by a specially trained health care provider. Only a trained health care provider should remove it. Do not have your blood pressure checked on the arm in which your PICC is placed. Always keep your PICC identification card with you. This information is not intended to replace advice given to you by your health care provider. Make sure you discuss any questions you have with your health care provider. Document Revised: 07/18/2020 Document Reviewed: 07/18/2020 Elsevier Patient Education  2024 [employer].

## 2023-02-04 ENCOUNTER — Inpatient Hospital Stay: Payer: 59

## 2023-02-05 ENCOUNTER — Ambulatory Visit: Payer: 59

## 2023-02-05 ENCOUNTER — Inpatient Hospital Stay: Payer: 59

## 2023-02-05 ENCOUNTER — Encounter: Payer: Self-pay | Admitting: Nutrition

## 2023-02-05 VITALS — BP 152/90 | HR 90 | Temp 98.1°F | Resp 18

## 2023-02-05 DIAGNOSIS — C19 Malignant neoplasm of rectosigmoid junction: Secondary | ICD-10-CM

## 2023-02-05 DIAGNOSIS — Z95828 Presence of other vascular implants and grafts: Secondary | ICD-10-CM

## 2023-02-05 DIAGNOSIS — Z5111 Encounter for antineoplastic chemotherapy: Secondary | ICD-10-CM | POA: Diagnosis not present

## 2023-02-05 MED ORDER — HEPARIN SOD (PORK) LOCK FLUSH 100 UNIT/ML IV SOLN
250.0000 [IU] | Freq: Once | INTRAVENOUS | Status: AC
  Administered 2023-02-05: 250 [IU] via INTRAVENOUS

## 2023-02-05 MED ORDER — SODIUM CHLORIDE 0.9% FLUSH
10.0000 mL | Freq: Once | INTRAVENOUS | Status: AC
Start: 2023-02-05 — End: 2023-02-05
  Administered 2023-02-05: 10 mL via INTRAVENOUS

## 2023-02-05 MED ORDER — SODIUM CHLORIDE 0.9 % IV SOLN
INTRAVENOUS | Status: AC
Start: 2023-02-05 — End: 2023-02-05

## 2023-02-05 NOTE — Patient Instructions (Signed)

## 2023-02-06 ENCOUNTER — Inpatient Hospital Stay: Payer: 59

## 2023-02-06 ENCOUNTER — Telehealth: Payer: Self-pay

## 2023-02-06 NOTE — Telephone Encounter (Signed)
Patient called on 02/06/23 and wanted to cancel IVF, stated the she did not need any fluids. She is aware of upcoming appointment on 02/10/23.

## 2023-02-10 ENCOUNTER — Inpatient Hospital Stay: Payer: 59

## 2023-02-10 VITALS — BP 124/94 | HR 87 | Temp 97.6°F | Resp 18

## 2023-02-10 DIAGNOSIS — Z5111 Encounter for antineoplastic chemotherapy: Secondary | ICD-10-CM | POA: Diagnosis not present

## 2023-02-10 DIAGNOSIS — Z452 Encounter for adjustment and management of vascular access device: Secondary | ICD-10-CM

## 2023-02-10 MED ORDER — HEPARIN SOD (PORK) LOCK FLUSH 100 UNIT/ML IV SOLN
250.0000 [IU] | Freq: Once | INTRAVENOUS | Status: AC
  Administered 2023-02-10: 250 [IU] via INTRAVENOUS

## 2023-02-10 MED ORDER — SODIUM CHLORIDE 0.9% FLUSH
10.0000 mL | Freq: Once | INTRAVENOUS | Status: AC
  Administered 2023-02-10: 10 mL via INTRAVENOUS

## 2023-02-10 NOTE — Patient Instructions (Signed)
PICC Home Care Guide A peripherally inserted central catheter (PICC) is a form of IV access that allows medicines and IV fluids to be quickly put into the blood and spread throughout the body. The PICC is a long, thin, flexible tube (catheter) that is put into a vein in a person's arm or leg. The catheter ends in a large vein just outside the heart called the superior vena cava (SVC). After the PICC is put in, a chest X-ray may be done to make sure that it is in the right place. A PICC may be placed for different reasons, such as: To give medicines and liquid nutrition. To give IV fluids and blood products. To take blood samples often. If there is trouble placing a peripheral intravenous (PIV) catheter. If cared for properly, a PICC can remain in place for many months. Having a PICC can allow you to go home from the hospital sooner and continue treatment at home. Medicines and PICC care can be managed at home by a family member, caregiver, or home health care team. What are the risks? Generally, having a PICC is safe. However, problems may occur, including: A blood clot (thrombus) forming in or at the end of the PICC. A blood clot forming in a vein (deep vein thrombosis) or traveling to the lung (pulmonary embolism). Inflammation of the vein (phlebitis) in which the PICC is placed. Infection at the insertion site or in the blood. Blood infections from central lines, like PICCs, can be serious and often require a hospital stay. PICC malposition, or PICC movement or poor placement. A break or cut in the PICC. Do not use scissors near the PICC. Nerve or tendon irritation or injury during PICC insertion. How to care for your PICC Please follow the specific guidelines provided by your health care provider. Preventing infection You and any caregivers should wash your hands often with soap and water for at least 20 seconds. Wash hands: Before touching the PICC or the infusion device. Before changing a  bandage (dressing). Do not change the dressing unless you have been taught to do so and have shown you are able to change it safely. Flush the PICC as told. Tell your health care provider right away if the PICC is hard to flush or does not flush. Do not use force to flush the PICC. Use clean and germ-free (sterile) supplies only. Keep the supplies in a dry place. Do not reuse needles, syringes, or any other supplies. Reusing supplies can lead to infection. Keep the PICC dressing dry and secure it with tape if the edges stop sticking to your skin. Check your PICC insertion site every day for signs of infection. Check for: Redness, swelling, or pain. Fluid or blood. Warmth. Pus or a bad smell. Preventing other problems Do not use a syringe that is less than 10 mL to flush the PICC. Do not have your blood pressure checked on the arm in which the PICC is placed. Do not ever pull or tug on the PICC. Keep it secured to your arm with tape or a stretch wrap when not in use. Do not take the PICC out yourself. Only a trained health care provider should remove the PICC. Keep pets and children away from your PICC. How to care for your PICC dressing Keep your PICC dressing clean and dry to prevent infection. Do not take baths, swim, or use a hot tub until your health care provider approves. Ask your health care provider if you can take  showers. You may only be allowed to take sponge baths. When you are allowed to shower: Ask your health care provider to teach you how to wrap the PICC. Cover the PICC with clear plastic wrap and tape to keep it dry while showering. Follow instructions from your health care provider about how to take care of your insertion site and dressing. Make sure you: Wash your hands with soap and water for at least 20 seconds before and after you change your dressing. If soap and water are not available, use hand sanitizer. Change your dressing only if taught to do so by your health care  provider. Your PICC dressing needs to be changed if it becomes loose or wet. Leave stitches (sutures), skin glue, or adhesive strips in place. These skin closures may need to stay in place for 2 weeks or longer. If adhesive strip edges start to loosen and curl up, you may trim the loose edges. Do not remove adhesive strips completely unless your health care provider tells you to do that. Follow these instructions at home: Disposal of supplies Throw away any syringes in a disposal container that is meant for sharp items (sharps container). You can buy a sharps container from a pharmacy, or you can make one by using an empty, hard plastic bottle with a lid. Place any used dressings or infusion bags into a plastic bag. Throw that bag in the trash. General instructions  Always carry your PICC identification card or wear a medical alert bracelet. Keep the tube clamped at all times, unless it is being used. Always carry a smooth-edge clamp with you to clamp the PICC if it breaks. Do not use scissors or sharp objects near the tube. You may bend your arm and move it freely. If your PICC is near or at the bend of your elbow, avoid activity with repeated motion at the elbow. Avoid lifting heavy objects as told by your health care provider. Keep all follow-up visits. This is important. You will need to have your PICC dressing changed at least once a week. Contact a health care provider if: You have pain in your arm, ear, face, or teeth. You have a fever or chills. You have redness, swelling, or pain around the insertion site. You have fluid or blood coming from the insertion site. Your insertion site feels warm to the touch. You have pus or a bad smell coming from the insertion site. Your skin feels hard and raised around the insertion site. Your PICC dressing has gotten wet or is coming off and you have not been taught how to change it. Get help right away if: You have problems with your PICC, such as  your PICC: Was tugged or pulled and has partially come out. Do not  push the PICC back in. Cannot be flushed, is hard to flush, or leaks around the insertion site when it is flushed. Makes a flushing sound when it is flushed. Appears to have a hole or tear. Is accidentally pulled all the way out. If this happens, cover the insertion site with a gauze dressing. Do not throw the PICC away. Your health care provider will need to check it to be sure the entire catheter came out. You feel your heart racing or skipping beats, or you have chest pain. You have shortness of breath or trouble breathing. You have swelling, redness, warmth, or pain in the arm in which the PICC is placed. You have a red streak going up your arm that  starts under the PICC dressing. These symptoms may be an emergency. Get help right away. Call 911. Do not wait to see if the symptoms will go away. Do not drive yourself to the hospital. Summary A peripherally inserted central catheter (PICC) is a long, thin, flexible tube (catheter) that is put into a vein in the arm or leg. If cared for properly, a PICC can remain in place for many months. Having a PICC can allow you to go home from the hospital sooner and continue treatment at home. The PICC is inserted using a germ-free (sterile) technique by a specially trained health care provider. Only a trained health care provider should remove it. Do not have your blood pressure checked on the arm in which your PICC is placed. Always keep your PICC identification card with you. This information is not intended to replace advice given to you by your health care provider. Make sure you discuss any questions you have with your health care provider. Document Revised: 07/18/2020 Document Reviewed: 07/18/2020 Elsevier Patient Education  2024 [employer].

## 2023-02-11 ENCOUNTER — Encounter: Payer: Self-pay | Admitting: Oncology

## 2023-02-12 ENCOUNTER — Other Ambulatory Visit (HOSPITAL_BASED_OUTPATIENT_CLINIC_OR_DEPARTMENT_OTHER): Payer: Self-pay

## 2023-02-15 ENCOUNTER — Other Ambulatory Visit: Payer: Self-pay | Admitting: Oncology

## 2023-02-16 ENCOUNTER — Other Ambulatory Visit: Payer: Self-pay | Admitting: Oncology

## 2023-02-16 ENCOUNTER — Telehealth: Payer: Self-pay | Admitting: *Deleted

## 2023-02-16 ENCOUNTER — Other Ambulatory Visit (HOSPITAL_BASED_OUTPATIENT_CLINIC_OR_DEPARTMENT_OTHER): Payer: Self-pay

## 2023-02-16 DIAGNOSIS — C19 Malignant neoplasm of rectosigmoid junction: Secondary | ICD-10-CM

## 2023-02-16 MED ORDER — HYDROCODONE-ACETAMINOPHEN 10-325 MG PO TABS
1.0000 | ORAL_TABLET | ORAL | 0 refills | Status: DC | PRN
  Filled 2023-02-16: qty 60, 10d supply, fill #0

## 2023-02-16 NOTE — Telephone Encounter (Signed)
April [name] called requesting refill on her dexamethasone and hydrocodone-apap (she is out). Confirmed w/pharmacy she still has refills on dexamethasone and they will fill it. Narcotic refill request to MD>

## 2023-02-17 ENCOUNTER — Inpatient Hospital Stay (HOSPITAL_BASED_OUTPATIENT_CLINIC_OR_DEPARTMENT_OTHER): Payer: 59 | Admitting: Oncology

## 2023-02-17 ENCOUNTER — Encounter: Payer: Self-pay | Admitting: Oncology

## 2023-02-17 ENCOUNTER — Inpatient Hospital Stay: Payer: 59

## 2023-02-17 ENCOUNTER — Other Ambulatory Visit (HOSPITAL_BASED_OUTPATIENT_CLINIC_OR_DEPARTMENT_OTHER): Payer: Self-pay

## 2023-02-17 ENCOUNTER — Inpatient Hospital Stay: Payer: 59 | Attending: Oncology

## 2023-02-17 VITALS — BP 142/82 | HR 93 | Temp 98.1°F | Resp 18 | Ht 64.0 in | Wt 135.6 lb

## 2023-02-17 DIAGNOSIS — Z5111 Encounter for antineoplastic chemotherapy: Secondary | ICD-10-CM | POA: Diagnosis not present

## 2023-02-17 DIAGNOSIS — C7951 Secondary malignant neoplasm of bone: Secondary | ICD-10-CM | POA: Diagnosis not present

## 2023-02-17 DIAGNOSIS — Z79634 Long term (current) use of topoisomerase inhibitor: Secondary | ICD-10-CM | POA: Insufficient documentation

## 2023-02-17 DIAGNOSIS — C7989 Secondary malignant neoplasm of other specified sites: Secondary | ICD-10-CM | POA: Insufficient documentation

## 2023-02-17 DIAGNOSIS — C19 Malignant neoplasm of rectosigmoid junction: Secondary | ICD-10-CM | POA: Diagnosis not present

## 2023-02-17 DIAGNOSIS — C786 Secondary malignant neoplasm of retroperitoneum and peritoneum: Secondary | ICD-10-CM | POA: Diagnosis not present

## 2023-02-17 DIAGNOSIS — Z5112 Encounter for antineoplastic immunotherapy: Secondary | ICD-10-CM | POA: Diagnosis present

## 2023-02-17 DIAGNOSIS — C7962 Secondary malignant neoplasm of left ovary: Secondary | ICD-10-CM | POA: Diagnosis not present

## 2023-02-17 DIAGNOSIS — C187 Malignant neoplasm of sigmoid colon: Secondary | ICD-10-CM | POA: Insufficient documentation

## 2023-02-17 DIAGNOSIS — Z79899 Other long term (current) drug therapy: Secondary | ICD-10-CM | POA: Diagnosis not present

## 2023-02-17 LAB — CBC WITH DIFFERENTIAL (CANCER CENTER ONLY)
Abs Immature Granulocytes: 0.03 10*3/uL (ref 0.00–0.07)
Basophils Absolute: 0 10*3/uL (ref 0.0–0.1)
Basophils Relative: 1 %
Eosinophils Absolute: 0.3 10*3/uL (ref 0.0–0.5)
Eosinophils Relative: 4 %
HCT: 33.5 % — ABNORMAL LOW (ref 36.0–46.0)
Hemoglobin: 11 g/dL — ABNORMAL LOW (ref 12.0–15.0)
Immature Granulocytes: 0 %
Lymphocytes Relative: 30 %
Lymphs Abs: 2.5 10*3/uL (ref 0.7–4.0)
MCH: 30 pg (ref 26.0–34.0)
MCHC: 32.8 g/dL (ref 30.0–36.0)
MCV: 91.3 fL (ref 80.0–100.0)
Monocytes Absolute: 0.6 10*3/uL (ref 0.1–1.0)
Monocytes Relative: 8 %
Neutro Abs: 4.8 10*3/uL (ref 1.7–7.7)
Neutrophils Relative %: 57 %
Platelet Count: 342 10*3/uL (ref 150–400)
RBC: 3.67 MIL/uL — ABNORMAL LOW (ref 3.87–5.11)
RDW: 16.1 % — ABNORMAL HIGH (ref 11.5–15.5)
WBC Count: 8.3 10*3/uL (ref 4.0–10.5)
nRBC: 0 % (ref 0.0–0.2)

## 2023-02-17 LAB — CMP (CANCER CENTER ONLY)
ALT: 26 U/L (ref 0–44)
AST: 33 U/L (ref 15–41)
Albumin: 3.7 g/dL (ref 3.5–5.0)
Alkaline Phosphatase: 432 U/L — ABNORMAL HIGH (ref 38–126)
Anion gap: 9 (ref 5–15)
BUN: 6 mg/dL (ref 6–20)
CO2: 28 mmol/L (ref 22–32)
Calcium: 9.5 mg/dL (ref 8.9–10.3)
Chloride: 99 mmol/L (ref 98–111)
Creatinine: 0.4 mg/dL — ABNORMAL LOW (ref 0.44–1.00)
GFR, Estimated: >60 mL/min (ref >60)
Glucose, Bld: 97 mg/dL (ref 70–99)
Potassium: 3.4 mmol/L — ABNORMAL LOW (ref 3.5–5.1)
Sodium: 136 mmol/L (ref 135–145)
Total Bilirubin: 0.4 mg/dL (ref 0.0–1.2)
Total Protein: 7.6 g/dL (ref 6.5–8.1)

## 2023-02-17 LAB — MAGNESIUM: Magnesium: 1.7 mg/dL (ref 1.7–2.4)

## 2023-02-17 LAB — CEA (ACCESS): CEA (CHCC): 6.47 ng/mL — ABNORMAL HIGH (ref 0.00–5.00)

## 2023-02-17 MED ORDER — OXYCODONE HCL 5 MG PO TABS
5.0000 mg | ORAL_TABLET | Freq: Once | ORAL | Status: AC
  Administered 2023-02-17: 5 mg via ORAL
  Filled 2023-02-17: qty 1

## 2023-02-17 MED ORDER — PALONOSETRON HCL INJECTION 0.25 MG/5ML
0.2500 mg | Freq: Once | INTRAVENOUS | Status: AC
  Administered 2023-02-17: 0.25 mg via INTRAVENOUS
  Filled 2023-02-17: qty 5

## 2023-02-17 MED ORDER — DICYCLOMINE HCL 20 MG PO TABS
20.0000 mg | ORAL_TABLET | Freq: Three times a day (TID) | ORAL | 1 refills | Status: DC | PRN
  Filled 2023-02-17: qty 60, 20d supply, fill #0
  Filled 2023-08-15: qty 60, 20d supply, fill #1

## 2023-02-17 MED ORDER — SODIUM CHLORIDE 0.9 % IV SOLN
150.0000 mg | Freq: Once | INTRAVENOUS | Status: AC
  Administered 2023-02-17: 150 mg via INTRAVENOUS
  Filled 2023-02-17: qty 150

## 2023-02-17 MED ORDER — ATROPINE SULFATE 1 MG/ML IV SOLN
0.5000 mg | Freq: Once | INTRAVENOUS | Status: AC | PRN
  Administered 2023-02-17: 0.5 mg via INTRAVENOUS
  Filled 2023-02-17: qty 1

## 2023-02-17 MED ORDER — HEPARIN SOD (PORK) LOCK FLUSH 100 UNIT/ML IV SOLN
500.0000 [IU] | Freq: Once | INTRAVENOUS | Status: AC | PRN
  Administered 2023-02-17: 250 [IU]

## 2023-02-17 MED ORDER — DEXAMETHASONE SODIUM PHOSPHATE 10 MG/ML IJ SOLN
10.0000 mg | Freq: Once | INTRAMUSCULAR | Status: AC
Start: 2023-02-17 — End: 2023-02-17
  Administered 2023-02-17: 10 mg via INTRAVENOUS
  Filled 2023-02-17: qty 1

## 2023-02-17 MED ORDER — DOXYCYCLINE HYCLATE 100 MG PO TABS
100.0000 mg | ORAL_TABLET | Freq: Two times a day (BID) | ORAL | 3 refills | Status: DC
  Filled 2023-02-17: qty 60, 30d supply, fill #0

## 2023-02-17 MED ORDER — SODIUM CHLORIDE 0.9 % IV SOLN
INTRAVENOUS | Status: DC
Start: 2023-02-17 — End: 2023-02-17

## 2023-02-17 MED ORDER — SODIUM CHLORIDE 0.9 % IV SOLN
125.0000 mg/m2 | Freq: Once | INTRAVENOUS | Status: AC
  Administered 2023-02-17: 200 mg via INTRAVENOUS
  Filled 2023-02-17: qty 10

## 2023-02-17 MED ORDER — SODIUM CHLORIDE 0.9 % IV SOLN
6.0000 mg/kg | Freq: Once | INTRAVENOUS | Status: AC
  Administered 2023-02-17: 400 mg via INTRAVENOUS
  Filled 2023-02-17: qty 20

## 2023-02-17 MED ORDER — ACETAMINOPHEN 325 MG PO TABS
325.0000 mg | ORAL_TABLET | Freq: Once | ORAL | Status: AC
  Administered 2023-02-17: 325 mg via ORAL
  Filled 2023-02-17: qty 1

## 2023-02-17 MED ORDER — SODIUM CHLORIDE 0.9% FLUSH
10.0000 mL | INTRAVENOUS | Status: DC | PRN
  Administered 2023-02-17: 10 mL

## 2023-02-17 NOTE — Progress Notes (Signed)
 [facility] OFFICE PROGRESS NOTE   Diagnosis: Colon cancer  INTERVAL HISTORY:   April [name] returns as scheduled.  She completed another treatment with irinotecan /panitumumab  on 02/03/2023.  No , mouth sores, or diarrhea.  Nausea was improved following this cycle.  She continues to have upper abdominal pain.  She takes hydrocodone  4-6 times per day in addition to MS Contin .  She no longer has rectal pain.  Objective:  Vital signs in last 24 hours:  Blood pressure (!) 142/82, pulse 93, temperature 98.1 F (36.7 C), temperature source Temporal, resp. rate 18, height 5' 4 (1.626 m), weight 135 lb 9.6 oz (61.5 kg), last menstrual period 01/07/2006, SpO2 100%.    HEENT: No thrush or ulcers Resp: Clear bilaterally Cardio: Rate and rhythm GI: Mildly distended, tender in the upper abdomen, no mass, no hepatosplenomegaly Vascular: No leg edema  Skin: Mild acne type rash over the face  Portacath/PICC-without erythema  Lab Results:  Lab Results  Component Value Date   WBC 8.3 02/17/2023   HGB 11.0 (L) 02/17/2023   HCT 33.5 (L) 02/17/2023   MCV 91.3 02/17/2023   PLT 342 02/17/2023   NEUTROABS 4.8 02/17/2023    CMP  Lab Results  Component Value Date   NA 136 02/17/2023   K 3.4 (L) 02/17/2023   CL 99 02/17/2023   CO2 28 02/17/2023   GLUCOSE 97 02/17/2023   BUN 6 02/17/2023   CREATININE 0.40 (L) 02/17/2023   CALCIUM  9.5 02/17/2023   PROT 7.6 02/17/2023   ALBUMIN 3.7 02/17/2023   AST 33 02/17/2023   ALT 26 02/17/2023   ALKPHOS 432 (H) 02/17/2023   BILITOT 0.4 02/17/2023   GFRNONAA >60 02/17/2023   GFRAA >60 10/17/2019    Lab Results  Component Value Date   CEA1 <0.6 03/23/2022   CEA 6.47 (H) 02/17/2023   CAN199 213 (H) 03/23/2022    Medications: I have reviewed the patient's current medications.   Assessment/Plan: Sigmoid colon cancer, stage IV (pT4a,pN2b,M1c) Colonoscopy 03/24/2019-3 rectal polyps-hyperplastic polyps, distal colon biopsy-at  least intramucosal adenocarcinoma, completely obstructing mass in the distal sigmoid colon, could not be traversed, intact mismatch repair protein expression 03/24/2019-CEA 56.1 03/29/2019 CT abdomen/pelvis-circumferential thickening involving the entire mid and distal sigmoid colon to the level of the rectosigmoid junction, solid/cystic mass in the left ovary, bilateral nephrolithiasis Robotic assisted low anterior resection, mesenteric lymphadenectomy, bilateral salpingo-oophorectomy 04/08/2019 Pathology ([facility] review of outside pathology) metastatic adenocarcinoma involving the peritoneum overlying the round ligament, serosa of the urinary bladder, serosa of the right ureter a sacral area, pelvic peritoneum, and left ovary.  Omental biopsy with focal mucin pools with no carcinoma cells identified, right hemidiaphragm biopsy involved by metastatic adenocarcinoma, invasive adenocarcinoma the sigmoid colon, moderately differentiated, T4a, perineural and vascular invasion present, 7/22 lymph nodes, multiple tumor deposits, resection margins negative Negative for PD-L1, low probability of MSI-high, HER-2 negative, negative for BRAF, NRAS and KRAS alterations CTs 05/19/2019-no evidence of metastatic disease, findings suspicious for colitis of the transverse and ascending colon, small amount of ascites in the cul-de-sac, bilateral renal calculi Cycle 1 FOLFIRI 05/24/2019, bevacizumab  added with cycle 2 Cycle 4 FOLFIRI/bevacizumab  07/05/2019 Cycle 5 FOLFIRI 07/27/2019, bevacizumab  held secondary to hypertension Cycle 6 FOLFIRI 08/10/1999, bevacizumab  held secondary to hypertension CTs 08/30/2019-no evidence of recurrent disease, new subsolid right lower lobe nodule felt to be inflammatory, emphysema Cycle 7 FOLFIRI 09/26/2019, bevacizumab  remains on hold secondary to hypertension Cycle 8 FOLFIRI 10/17/2019, bevacizumab  held secondary to hypertension, Udenyca  added for neutropenia Cycle  9 FOLFIRI 11/07/2019, bevacizumab   held secondary to hypertension, Udenyca  Cycle 10 FOLFIRI 11/28/2019, bevacizumab  held, Udenyca  Cycle 11 FOLFIRI 12/26/2019, bevacizumab  held, Udenyca  CTs 01/25/2020-no evidence of recurrent disease, resolution of right lower lobe nodule Maintenance Xeloda  beginning 02/06/2020 CTs 04/25/2020- no evidence of metastatic disease, multiple bilateral renal calculi without hydronephrosis Maintenance Xeloda  continued CT abdomen/pelvis without contrast 08/10/2020-bilateral staghorn renal calculi, no evidence of metastatic disease; addendum 08/23/2020-small but increasing omental nodules. Cycle 1 FOLFIRI/panitumumab  09/19/2020 Cycle 2 FOLFIRI/Panitumumab  10/03/2020 Cycle 3 FOLFIRI/Panitumumab  10/17/2020 Cycle 4 FOLFIRI/panitumumab  10/31/2020 Cycle 5 FOLFIRI/Panitumumab  11/14/2020 CTs 11/26/2020-stable omental metastases compared to 10/22/2020, mildly decreased from 08/10/2020.  Left lower quadrant tiny paracolic gutter implant slightly decreased from CT 08/10/2020.  No new or progressive metastatic disease in the abdomen or pelvis. Cycle 6 FOLFIRI/Panitumumab  11/28/2020, irinotecan  dose reduced, treatment schedule adjusted to every 3 weeks going forward Cycle 7 FOLFIRI/panitumumab  12/24/2020 Cycle 8 FOLFIRI/Panitumumab  01/16/2021--treatment held due to hypertension in the infusion area. Cycle 8 FOLFIRI/panitumumab  01/21/2021 Cycle 9 FOLFIRI/panitumumab  02/11/2021 Cycle 10 FOLFIRI/Panitumumab  03/04/2021 Cycle 11 FOLFIRI/Panitumumab  03/25/2021 CT abdomen/pelvis 04/10/2021-stable right omental and left posterior paracolic gutter nodules Cycle 12 FOLFIRI/panitumumab  04/15/2021 Cycle 13 FOLFIRI/panitumumab  05/06/2021 Cycle 14 FOLFIRI/Panitumumab  05/27/2021 Cycle 15 FOLFIRI/Panitumumab  06/17/2021 CT abdomen/pelvis 07/05/2021-slight enlargement of a dominant right omental implant, no new implants Patient requested a treatment break CT abdomen/pelvis 10/04/2021-mild increase in size of multiple omental soft tissue nodules, 4 mm  calculus in the distal left ureter adjacent to the ureteral stent Cycle 1 Lonsurf  10/14/2021 10/28/2021 Avastin  every 2 weeks Cycle 2 Lonsurf  11/11/2021 Cycle 3 Lonsurf  12/09/2021 Cycle 4 Lonsurf  01/06/2022 Avastin  held 01/20/2022 due to proteinuria, 24-hour urine 43 mg CT/pelvis 01/30/2022-possible new peritoneal implant at the transverse colon, no significant change in other omental/peritoneal implants, stable chronic rectal wall thickening, status post removal of double-J left ureteral stent with no evidence of hydronephrosis or ureteral calculus, nonobstructing bilateral renal calculi Cycle 5 Lonsurf  02/05/2022 or 02/06/2022 Avastin  every 2 weeks Cycle 6 Lonsurf  03/03/2022 CTs 04/05/2022-increase in size and number of peritoneal implants compared to January 2024 central liver mass narrowing the anterior branch of the right portal vein, decreased biliary duct dilation, biliary stents in place, stable right cardiophrenic lymph nodes, separate pigtail stent in the lumen of the proximal duodenum Cycle 1 FOLFOX 04/14/2022 CT abdomen/pelvis 04/16/2022-stent within the third portion of the duodenum, stable peritoneal nodules, indwelling biliary stents with mild left biliary duct dilation Cycle 2 FOLFOX 04/28/2022, Emend and prophylactic dexamethasone  added Cycle 3 FOLFOX 05/12/2022, Aloxi , Emend, prophylactic dexamethasone , Compazine  and lorazepam  as needed Chemotherapy held 05/25/2020 due to severe hypertension, evaluated in the emergency department Cycle 4 FOLFOX 06/03/2022 Cycle 5 FOLFOX 06/16/2022, oxaliplatin  dose reduced and 5-FU bolus eliminated due to neutropenia CT 06/25/2022-unchanged soft tissue at the hepatic hilum, unchanged, bile duct stent, decreased size of peritoneal and omental nodules and diminished volume of likely fluid in the lower left pelvis Cycle 6 FOLFOX 06/30/2022 Cycle 7 FOLFOX 07/21/2022 CT 07/24/2022 (in the emergency department with nausea, headache, abdominal pain)-no acute pathology.  No  significant interval change in omental nodularity. Cycle 8 FOLFOX 08/04/2022, pruritus on the palms at the end of the oxaliplatin  infusion, Pepcid  and Benadryl  given, symptoms resolved, infusion not resumed Cycle 9 FOLFOX 08/18/2022-Pepcid  and Benadryl  added to premedications, Oxaliplatin  diluted in a larger volume and infusion time increased Cycle 10 FOLFOX 09/08/2022 Cycle 11 FOLFOX 09/22/2022 10/06/2022 patient declined treatment CTs 10/15/2022-similar soft tissue fullness in the hepatic hilum with biliary stents in place, decrease in omental/peritoneal metastases Maintenance capecitabine  10/26/2022 CT abdomen/pelvis  11/19/2022-mild increase in tumor studding at the left paracolic gutter and omentum, circumferential wall thickening of the rectum, biliary stents in place with minimal increase in intrahepatic biliary dilation, nonobstructive renal calculi Colonoscopy 11/23/2022-anastomosis at 15 cm from the anal verge-patent, 2 cm polypoid tissue at the anastomosis biopsied, invasive adenocarcinoma moderately differentiated, preserved expression of major MMR proteins CTs 12/15/2022: Possible developing nodule in the lateral left liver, increased omental nodularity, persistent wall thickening in the upper rectum CT abdomen/pelvis 12/24/2022: Increased thickened fold in the transverse colon-infectious/inflammatory, 2.1 cm lateral left liver lesion, potential new 5 mm segment 5 lesion, stable biliary stents with intrahepatic biliary dilation, central biliary wall thickening near the hepatic hilum, multiple peritoneal/omental implants unchanged from 12/15/2022, but likely larger than 11/19/2022 Cycle 1 irinotecan /Panitumumab  01/06/2023 Cycle 2 irinotecan /Panitumumab  01/20/2023 Cycle 3 irinotecan /Panitumumab  02/03/2023 Cycle 4 irinotecan /panitumumab  02/17/2023   Hypertension G4 P3, twins Kidney stones-bilateral staghorn renal calculi on CT 08/10/2020 Infected Port-A-Cath 09/12/2019-placed on Augmentin , referred for  Port-A-Cath removal; Port-A-Cath removed 09/14/2019; PICC line placed 09/14/2019; culture staph aureus.  Course of Septra  completed. Neutropenia secondary to chemotherapy-Udenyca  added with cycle 8 FOLFIRI Right nephrostomy tube 09/12/2020; stent placed 10/09/2020, percutaneous nephrostolithotomy treatment of right-sided kidney stones 10/09/2020, malpositioned right ureter stent replacement 10/23/2020, right ureter stent removed 10/29/2020 Percutaneous left nephrostolithotomy 10/01/2021-no renal stone identified, left ureter stent left in place Cystoscopy 10/14/2021-stent removed 8.  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  Admission 04/16/2022 with nausea/vomiting and abdominal pain, felt to be acute toxicity related to chemotherapy, discharged home 04/18/2022 10.  Admission 11/19/2022 with nausea/vomiting and abdominal pain-improved 11.  Admission 12/15/2022 with fever and right submandibular swelling/tenderness CT neck 12/15/2022: 2 mm distal right submandibular duct calculus with mild inflammatory change adjacent to the right submandibular gland 12.  Admission 12/24/2022 with a fever nausea/vomiting, diarrhea, Klebsiella bacteremia completed course of Augmentin      Disposition: April [name] appears stable.  She is tolerating the irinotecan /panitumumab  well.  She will complete cycle 4 today.  She will receive intravenous fluids on day 3 and home Decadron  prophylaxis.  She will continue MS Contin  and hydrocodone  for pain.   She will resume doxycycline  for the panitumumab  rash.  April [name] will return for an office visit and chemotherapy in 2 weeks.  We will plan for a restaging CT evaluation after 6 cycles of irinotecan /panitumumab .  She will contact us  for increased nausea or pain.  Arley Hof, MD  02/17/2023  9:21 AM

## 2023-02-17 NOTE — Patient Instructions (Signed)
 [facility] - A DEPT OF [facility]   Discharge Instructions: Thank you for choosing [facility] to provide your oncology and hematology care.   If you have a lab appointment with the Cancer Center, please go directly to the Cancer Center and check in at the registration area.   Wear comfortable clothing and clothing appropriate for easy access to any Portacath or PICC line.   We strive to give you quality time with your provider. You may need to reschedule your appointment if you arrive late (15 or more minutes).  Arriving late affects you and other patients whose appointments are after yours.  Also, if you miss three or more appointments without notifying the office, you may be dismissed from the clinic at the provider's discretion.      For prescription refill requests, have your pharmacy contact our office and allow 72 hours for refills to be completed.    Today you received the following chemotherapy and/or immunotherapy agents VECTIBIX/IRINOTECAN.      To help prevent nausea and vomiting after your treatment, we encourage you to take your nausea medication as directed.  BELOW ARE SYMPTOMS THAT SHOULD BE REPORTED IMMEDIATELY: *FEVER GREATER THAN 100.4 F (38 C) OR HIGHER *CHILLS OR SWEATING *NAUSEA AND VOMITING THAT IS NOT CONTROLLED WITH YOUR NAUSEA MEDICATION *UNUSUAL SHORTNESS OF BREATH *UNUSUAL BRUISING OR BLEEDING *URINARY PROBLEMS (pain or burning when urinating, or frequent urination) *BOWEL PROBLEMS (unusual diarrhea, constipation, pain near the anus) TENDERNESS IN MOUTH AND THROAT WITH OR WITHOUT PRESENCE OF ULCERS (sore throat, sores in mouth, or a toothache) UNUSUAL RASH, SWELLING OR PAIN  UNUSUAL VAGINAL DISCHARGE OR ITCHING   Items with * indicate a potential emergency and should be followed up as soon as possible or go to the Emergency Department if any problems should occur.  Please show the CHEMOTHERAPY ALERT CARD or  IMMUNOTHERAPY ALERT CARD at check-in to the Emergency Department and triage nurse.  Should you have questions after your visit or need to cancel or reschedule your appointment, please contact [facility]  Dept: [phone]  and follow the prompts.  Office hours are 8:00 a.m. to 4:30 p.m. Monday - Friday. Please note that voicemails left after 4:00 p.m. may not be returned until the following business day.  We are closed weekends and major holidays. You have access to a nurse at all times for urgent questions. Please call the main number to the clinic Dept: [phone] and follow the prompts.   For any non-urgent questions, you may also contact your provider using MyChart. We now offer e-Visits for anyone 9 and older to request care online for non-urgent symptoms. For details visit [email or web address].   Also download the MyChart app! Go to the app store, search "MyChart", open the app, select [facility], and log in with your MyChart username and password.  Panitumumab Injection What is this medication? PANITUMUMAB (pan i TOOM ue mab) treats colorectal cancer. It works by blocking a protein that causes cancer cells to grow and multiply. This helps to slow or stop the spread of cancer cells. It is a monoclonal antibody. This medicine may be used for other purposes; ask your health care provider or pharmacist if you have questions. COMMON BRAND NAME(S): Vectibix What should I tell my care team before I take this medication? They need to know if you have any of these conditions: Eye disease Low levels  of magnesium in the blood Lung disease An unusual or allergic reaction to panitumumab, other medications, foods, dyes, or preservatives Pregnant or trying to get pregnant Breast-feeding How should I use this medication? This medication is injected into a vein. It is given by your care team in a hospital or clinic setting. Talk to your  care team about the use of this medication in children. Special care may be needed. Overdosage: If you think you have taken too much of this medicine contact a poison control center or emergency room at once. NOTE: This medicine is only for you. Do not share this medicine with others. What if I miss a dose? Keep appointments for follow-up doses. It is important not to miss your dose. Call your care team if you are unable to keep an appointment. What may interact with this medication? Bevacizumab This list may not describe all possible interactions. Give your health care provider a list of all the medicines, herbs, non-prescription drugs, or dietary supplements you use. Also tell them if you smoke, drink alcohol, or use illegal drugs. Some items may interact with your medicine. What should I watch for while using this medication? Your condition will be monitored carefully while you are receiving this medication. This medication may make you feel generally unwell. This is not uncommon as chemotherapy can affect healthy cells as well as cancer cells. Report any side effects. Continue your course of treatment even though you feel ill unless your care team tells you to stop. This medication can make you more sensitive to the sun. Keep out of the sun while receiving this medication and for 2 months after stopping therapy. If you cannot avoid being in the sun, wear protective clothing and sunscreen. Do not use sun lamps, tanning beds, or tanning booths. Check with your care team if you have severe diarrhea, nausea, and vomiting or if you sweat a lot. The loss of too much body fluid may make it dangerous for you to take this medication. This medication may cause serious skin reactions. They can happen weeks to months after starting the medication. Contact your care team right away if you notice fevers or flu-like symptoms with a rash. The rash may be red or purple and then turn into blisters or peeling of the  skin. You may also notice a red rash with swelling of the face, lips, or lymph nodes in your neck or under your arms. Talk to your care team if you may be pregnant. Serious birth defects can occur if you take this medication during pregnancy and for 2 months after the last dose. Contraception is recommended while taking this medication and for 2 months after the last dose. Your care team can help you find the option that works for you. Do not breastfeed while taking this medication and for 2 months after the last dose. This medication may cause infertility. Talk to your care team if you are concerned about your fertility. What side effects may I notice from receiving this medication? Side effects that you should report to your care team as soon as possible: Allergic reactions--skin rash, itching, hives, swelling of the face, lips, tongue, or throat Dry cough, shortness of breath or trouble breathing Eye pain, redness, irritation, or discharge with blurry or decreased vision Infusion reactions--chest pain, shortness of breath or trouble breathing, feeling faint or lightheaded Low magnesium level--muscle pain or cramps, unusual weakness or fatigue, fast or irregular heartbeat, tremors Low potassium level--muscle pain or cramps, unusual weakness or  fatigue, fast or irregular heartbeat, constipation Redness, blistering, peeling, or loosening of the skin, including inside the mouth Skin reactions on sun-exposed areas Side effects that usually do not require medical attention (report to your care team if they continue or are bothersome): Change in nail shape, thickness, or color Diarrhea Dry skin Fatigue Nausea Vomiting This list may not describe all possible side effects. Call your doctor for medical advice about side effects. You may report side effects to FDA at 1-800-FDA-1088. Where should I keep my medication? This medication is given in a hospital or clinic. It will not be stored at  home. NOTE: This sheet is a summary. It may not cover all possible information. If you have questions about this medicine, talk to your doctor, pharmacist, or health care provider.  2024 Elsevier/Gold Standard (2021-05-15 00:00:00)  Irinotecan Injection What is this medication? IRINOTECAN (ir in oh TEE kan) treats some types of cancer. It works by slowing down the growth of cancer cells. This medicine may be used for other purposes; ask your health care provider or pharmacist if you have questions. COMMON BRAND NAME(S): Camptosar What should I tell my care team before I take this medication? They need to know if you have any of these conditions: Dehydration Diarrhea Infection, especially a viral infection, such as chickenpox, cold sores, herpes Liver disease Low blood cell levels (white cells, red cells, and platelets) Low levels of electrolytes, such as calcium, magnesium, or potassium in your blood Recent or ongoing radiation An unusual or allergic reaction to irinotecan, other medications, foods, dyes, or preservatives If you or your partner are pregnant or trying to get pregnant Breast-feeding How should I use this medication? This medication is injected into a vein. It is given by your care team in a hospital or clinic setting. Talk to your care team about the use of this medication in children. Special care may be needed. Overdosage: If you think you have taken too much of this medicine contact a poison control center or emergency room at once. NOTE: This medicine is only for you. Do not share this medicine with others. What if I miss a dose? Keep appointments for follow-up doses. It is important not to miss your dose. Call your care team if you are unable to keep an appointment. What may interact with this medication? Do not take this medication with any of the following: Cobicistat Itraconazole This medication may also interact with the following: Certain antibiotics, such  as clarithromycin, rifampin, rifabutin Certain antivirals for HIV or AIDS Certain medications for fungal infections, such as ketoconazole, posaconazole, voriconazole Certain medications for seizures, such as carbamazepine, phenobarbital, phenytoin Gemfibrozil Nefazodone St. John's wort This list may not describe all possible interactions. Give your health care provider a list of all the medicines, herbs, non-prescription drugs, or dietary supplements you use. Also tell them if you smoke, drink alcohol, or use illegal drugs. Some items may interact with your medicine. What should I watch for while using this medication? Your condition will be monitored carefully while you are receiving this medication. You may need blood work while taking this medication. This medication may make you feel generally unwell. This is not uncommon as chemotherapy can affect healthy cells as well as cancer cells. Report any side effects. Continue your course of treatment even though you feel ill unless your care team tells you to stop. This medication can cause serious side effects. To reduce the risk, your care team may give you other medications to take  before receiving this one. Be sure to follow the directions from your care team. This medication may affect your coordination, reaction time, or judgement. Do not drive or operate machinery until you know how this medication affects you. Sit up or stand slowly to reduce the risk of dizzy or fainting spells. Drinking alcohol with this medication can increase the risk of these side effects. This medication may increase your risk of getting an infection. Call your care team for advice if you get a fever, chills, sore throat, or other symptoms of a cold or flu. Do not treat yourself. Try to avoid being around people who are sick. Avoid taking medications that contain aspirin, acetaminophen, ibuprofen, naproxen, or ketoprofen unless instructed by your care team. These  medications may hide a fever. This medication may increase your risk to bruise or bleed. Call your care team if you notice any unusual bleeding. Be careful brushing or flossing your teeth or using a toothpick because you may get an infection or bleed more easily. If you have any dental work done, tell your dentist you are receiving this medication. Talk to your care team if you or your partner are pregnant or think either of you might be pregnant. This medication can cause serious birth defects if taken during pregnancy and for 6 months after the last dose. You will need a negative pregnancy test before starting this medication. Contraception is recommended while taking this medication and for 6 months after the last dose. Your care team can help you find the option that works for you. Do not father a child while taking this medication and for 3 months after the last dose. Use a condom for contraception during this time period. Do not breastfeed while taking this medication and for 7 days after the last dose. This medication may cause infertility. Talk to your care team if you are concerned about your fertility. What side effects may I notice from receiving this medication? Side effects that you should report to your care team as soon as possible: Allergic reactions--skin rash, itching, hives, swelling of the face, lips, tongue, or throat Dry cough, shortness of breath or trouble breathing Increased saliva or tears, increased sweating, stomach cramping, diarrhea, small pupils, unusual weakness or fatigue, slow heartbeat Infection--fever, chills, cough, sore throat, wounds that don't heal, pain or trouble when passing urine, general feeling of discomfort or being unwell Kidney injury--decrease in the amount of urine, swelling of the ankles, hands, or feet Low red blood cell level--unusual weakness or fatigue, dizziness, headache, trouble breathing Severe or prolonged diarrhea Unusual bruising or  bleeding Side effects that usually do not require medical attention (report to your care team if they continue or are bothersome): Constipation Diarrhea Hair loss Loss of appetite Nausea Stomach pain This list may not describe all possible side effects. Call your doctor for medical advice about side effects. You may report side effects to FDA at 1-800-FDA-1088. Where should I keep my medication? This medication is given in a hospital or clinic. It will not be stored at home. NOTE: This sheet is a summary. It may not cover all possible information. If you have questions about this medicine, talk to your doctor, pharmacist, or health care provider.  2024 Elsevier/Gold Standard (2021-05-13 00:00:00)

## 2023-02-17 NOTE — Addendum Note (Signed)
Addended by: Wandalee Ferdinand on: 02/17/2023 09:31 AM   Modules accepted: Orders

## 2023-02-17 NOTE — Progress Notes (Signed)
 Patient seen by Dr. Arley Hof today  Vitals are within treatment parameters:Yes   Labs are within treatment parameters: Yes   Treatment plan has been signed: Yes   Per physician team, Patient is ready for treatment and there are NO modifications to the treatment plan.

## 2023-02-19 ENCOUNTER — Encounter (HOSPITAL_COMMUNITY): Payer: Self-pay | Admitting: Gastroenterology

## 2023-02-19 ENCOUNTER — Telehealth: Payer: Self-pay

## 2023-02-19 ENCOUNTER — Inpatient Hospital Stay: Payer: 59

## 2023-02-19 NOTE — Telephone Encounter (Signed)
 Patient called on 02/19/2023 and asked to cancel IVF infusion, and asked for her ride from [place] to be cancelled as well. Patient stated that if she felt like fluids was needed she would call back.

## 2023-02-24 ENCOUNTER — Inpatient Hospital Stay (HOSPITAL_BASED_OUTPATIENT_CLINIC_OR_DEPARTMENT_OTHER): Payer: 59 | Admitting: Nurse Practitioner

## 2023-02-24 ENCOUNTER — Encounter: Payer: Self-pay | Admitting: *Deleted

## 2023-02-24 ENCOUNTER — Inpatient Hospital Stay: Admit: 2023-02-24 | Payer: 59

## 2023-02-24 ENCOUNTER — Other Ambulatory Visit: Payer: Self-pay | Admitting: *Deleted

## 2023-02-24 ENCOUNTER — Inpatient Hospital Stay: Payer: 59

## 2023-02-24 ENCOUNTER — Inpatient Hospital Stay (HOSPITAL_BASED_OUTPATIENT_CLINIC_OR_DEPARTMENT_OTHER)
Admission: EM | Admit: 2023-02-24 | Discharge: 2023-02-27 | DRG: 391 | Disposition: A | Discharge status: Home / Self Care | Payer: 59 | Attending: Family Medicine | Admitting: Family Medicine

## 2023-02-24 ENCOUNTER — Encounter (HOSPITAL_COMMUNITY): Payer: Self-pay

## 2023-02-24 ENCOUNTER — Other Ambulatory Visit: Payer: Self-pay

## 2023-02-24 ENCOUNTER — Telehealth: Payer: Self-pay | Admitting: *Deleted

## 2023-02-24 ENCOUNTER — Encounter (HOSPITAL_BASED_OUTPATIENT_CLINIC_OR_DEPARTMENT_OTHER): Payer: Self-pay | Admitting: Emergency Medicine

## 2023-02-24 ENCOUNTER — Emergency Department (HOSPITAL_BASED_OUTPATIENT_CLINIC_OR_DEPARTMENT_OTHER): Payer: 59

## 2023-02-24 VITALS — BP 142/90 | HR 122 | Temp 98.1°F | Resp 18 | Ht 64.0 in | Wt 130.6 lb

## 2023-02-24 DIAGNOSIS — K831 Obstruction of bile duct: Secondary | ICD-10-CM | POA: Diagnosis present

## 2023-02-24 DIAGNOSIS — I700 Atherosclerosis of aorta: Secondary | ICD-10-CM | POA: Diagnosis not present

## 2023-02-24 DIAGNOSIS — K5900 Constipation, unspecified: Secondary | ICD-10-CM | POA: Diagnosis present

## 2023-02-24 DIAGNOSIS — Z87891 Personal history of nicotine dependence: Secondary | ICD-10-CM

## 2023-02-24 DIAGNOSIS — R1084 Generalized abdominal pain: Secondary | ICD-10-CM | POA: Diagnosis not present

## 2023-02-24 DIAGNOSIS — K219 Gastro-esophageal reflux disease without esophagitis: Secondary | ICD-10-CM | POA: Diagnosis not present

## 2023-02-24 DIAGNOSIS — Z833 Family history of diabetes mellitus: Secondary | ICD-10-CM

## 2023-02-24 DIAGNOSIS — Z79899 Other long term (current) drug therapy: Secondary | ICD-10-CM

## 2023-02-24 DIAGNOSIS — K2289 Other specified disease of esophagus: Secondary | ICD-10-CM | POA: Diagnosis not present

## 2023-02-24 DIAGNOSIS — C787 Secondary malignant neoplasm of liver and intrahepatic bile duct: Secondary | ICD-10-CM | POA: Diagnosis not present

## 2023-02-24 DIAGNOSIS — I10 Essential (primary) hypertension: Secondary | ICD-10-CM | POA: Diagnosis present

## 2023-02-24 DIAGNOSIS — E860 Dehydration: Secondary | ICD-10-CM

## 2023-02-24 DIAGNOSIS — R109 Unspecified abdominal pain: Secondary | ICD-10-CM | POA: Diagnosis present

## 2023-02-24 DIAGNOSIS — G893 Neoplasm related pain (acute) (chronic): Principal | ICD-10-CM

## 2023-02-24 DIAGNOSIS — C19 Malignant neoplasm of rectosigmoid junction: Secondary | ICD-10-CM

## 2023-02-24 DIAGNOSIS — K2970 Gastritis, unspecified, without bleeding: Secondary | ICD-10-CM | POA: Diagnosis present

## 2023-02-24 DIAGNOSIS — C20 Malignant neoplasm of rectum: Secondary | ICD-10-CM | POA: Diagnosis not present

## 2023-02-24 DIAGNOSIS — Z9889 Other specified postprocedural states: Secondary | ICD-10-CM

## 2023-02-24 DIAGNOSIS — K3189 Other diseases of stomach and duodenum: Secondary | ICD-10-CM | POA: Diagnosis present

## 2023-02-24 DIAGNOSIS — Z87442 Personal history of urinary calculi: Secondary | ICD-10-CM

## 2023-02-24 DIAGNOSIS — D630 Anemia in neoplastic disease: Secondary | ICD-10-CM | POA: Diagnosis not present

## 2023-02-24 DIAGNOSIS — Z888 Allergy status to other drugs, medicaments and biological substances status: Secondary | ICD-10-CM

## 2023-02-24 DIAGNOSIS — Z823 Family history of stroke: Secondary | ICD-10-CM

## 2023-02-24 DIAGNOSIS — R748 Abnormal levels of other serum enzymes: Secondary | ICD-10-CM | POA: Diagnosis present

## 2023-02-24 DIAGNOSIS — Z8249 Family history of ischemic heart disease and other diseases of the circulatory system: Secondary | ICD-10-CM | POA: Diagnosis not present

## 2023-02-24 DIAGNOSIS — T85528A Displacement of other gastrointestinal prosthetic devices, implants and grafts, initial encounter: Secondary | ICD-10-CM

## 2023-02-24 DIAGNOSIS — E876 Hypokalemia: Secondary | ICD-10-CM

## 2023-02-24 DIAGNOSIS — C189 Malignant neoplasm of colon, unspecified: Secondary | ICD-10-CM

## 2023-02-24 DIAGNOSIS — N200 Calculus of kidney: Secondary | ICD-10-CM | POA: Diagnosis present

## 2023-02-24 DIAGNOSIS — R1010 Upper abdominal pain, unspecified: Secondary | ICD-10-CM | POA: Diagnosis present

## 2023-02-24 DIAGNOSIS — R112 Nausea with vomiting, unspecified: Secondary | ICD-10-CM

## 2023-02-24 LAB — CBC WITH DIFFERENTIAL (CANCER CENTER ONLY)
Abs Immature Granulocytes: 0.04 10*3/uL (ref 0.00–0.07)
Basophils Absolute: 0 10*3/uL (ref 0.0–0.1)
Basophils Relative: 0 %
Eosinophils Absolute: 0.2 10*3/uL (ref 0.0–0.5)
Eosinophils Relative: 2 %
HCT: 32.3 % — ABNORMAL LOW (ref 36.0–46.0)
Hemoglobin: 10.8 g/dL — ABNORMAL LOW (ref 12.0–15.0)
Immature Granulocytes: 0 %
Lymphocytes Relative: 29 %
Lymphs Abs: 2.6 10*3/uL (ref 0.7–4.0)
MCH: 30.2 pg (ref 26.0–34.0)
MCHC: 33.4 g/dL (ref 30.0–36.0)
MCV: 90.2 fL (ref 80.0–100.0)
Monocytes Absolute: 0.7 10*3/uL (ref 0.1–1.0)
Monocytes Relative: 7 %
Neutro Abs: 5.4 10*3/uL (ref 1.7–7.7)
Neutrophils Relative %: 62 %
Platelet Count: 321 10*3/uL (ref 150–400)
RBC: 3.58 MIL/uL — ABNORMAL LOW (ref 3.87–5.11)
RDW: 15.3 % (ref 11.5–15.5)
WBC Count: 8.9 10*3/uL (ref 4.0–10.5)
nRBC: 0 % (ref 0.0–0.2)

## 2023-02-24 LAB — LIPASE, BLOOD: Lipase: 20 U/L (ref 11–51)

## 2023-02-24 LAB — CMP (CANCER CENTER ONLY)
ALT: 20 U/L (ref 0–44)
AST: 23 U/L (ref 15–41)
Albumin: 3.5 g/dL (ref 3.5–5.0)
Alkaline Phosphatase: 422 U/L — ABNORMAL HIGH (ref 38–126)
Anion gap: 7 (ref 5–15)
BUN: 5 mg/dL — ABNORMAL LOW (ref 6–20)
CO2: 34 mmol/L — ABNORMAL HIGH (ref 22–32)
Calcium: 9.5 mg/dL (ref 8.9–10.3)
Chloride: 93 mmol/L — ABNORMAL LOW (ref 98–111)
Creatinine: 0.38 mg/dL — ABNORMAL LOW (ref 0.44–1.00)
GFR, Estimated: >60 mL/min (ref >60)
Glucose, Bld: 89 mg/dL (ref 70–99)
Potassium: 2.8 mmol/L — ABNORMAL LOW (ref 3.5–5.1)
Sodium: 134 mmol/L — ABNORMAL LOW (ref 135–145)
Total Bilirubin: 0.5 mg/dL (ref 0.0–1.2)
Total Protein: 7.3 g/dL (ref 6.5–8.1)

## 2023-02-24 LAB — MAGNESIUM: Magnesium: 1.6 mg/dL — ABNORMAL LOW (ref 1.7–2.4)

## 2023-02-24 MED ORDER — ONDANSETRON HCL 4 MG/2ML IJ SOLN
4.0000 mg | Freq: Once | INTRAMUSCULAR | Status: AC
  Administered 2023-02-24: 4 mg via INTRAVENOUS
  Filled 2023-02-24: qty 2

## 2023-02-24 MED ORDER — KCL-LACTATED RINGERS-D5W 20 MEQ/L IV SOLN
INTRAVENOUS | Status: DC
  Filled 2023-02-24 (×3): qty 1000

## 2023-02-24 MED ORDER — ENOXAPARIN SODIUM 40 MG/0.4ML IJ SOSY
40.0000 mg | PREFILLED_SYRINGE | Freq: Every day | INTRAMUSCULAR | Status: DC
  Administered 2023-02-24 – 2023-02-25 (×2): 40 mg via SUBCUTANEOUS
  Filled 2023-02-24 (×3): qty 0.4

## 2023-02-24 MED ORDER — SODIUM CHLORIDE 0.9 % IV SOLN: INTRAVENOUS | Status: DC

## 2023-02-24 MED ORDER — SODIUM CHLORIDE 0.9% FLUSH
10.0000 mL | Freq: Two times a day (BID) | INTRAVENOUS | Status: DC
  Administered 2023-02-24: 20 mL
  Administered 2023-02-25 – 2023-02-27 (×5): 10 mL

## 2023-02-24 MED ORDER — SODIUM CHLORIDE 0.9% FLUSH: 10.0000 mL | INTRAVENOUS | Status: DC | PRN

## 2023-02-24 MED ORDER — ONDANSETRON HCL 4 MG PO TABS: 4.0000 mg | ORAL_TABLET | Freq: Four times a day (QID) | ORAL | Status: DC | PRN

## 2023-02-24 MED ORDER — MORPHINE SULFATE (PF) 2 MG/ML IV SOLN
2.0000 mg | Freq: Once | INTRAVENOUS | Status: AC
  Administered 2023-02-24: 2 mg via INTRAVENOUS
  Filled 2023-02-24: qty 1

## 2023-02-24 MED ORDER — HYDROMORPHONE HCL 1 MG/ML IJ SOLN
1.0000 mg | INTRAMUSCULAR | Status: AC | PRN
  Administered 2023-02-24 (×3): 1 mg via INTRAVENOUS
  Filled 2023-02-24 (×3): qty 1

## 2023-02-24 MED ORDER — MAGNESIUM SULFATE 2 GM/50ML IV SOLN
2.0000 g | Freq: Once | INTRAVENOUS | Status: AC
  Administered 2023-02-24: 2 g via INTRAVENOUS
  Filled 2023-02-24: qty 50

## 2023-02-24 MED ORDER — HYDROMORPHONE HCL 1 MG/ML IJ SOLN
1.0000 mg | Freq: Once | INTRAMUSCULAR | Status: AC
  Administered 2023-02-24: 1 mg via INTRAVENOUS
  Filled 2023-02-24: qty 1

## 2023-02-24 MED ORDER — ONDANSETRON HCL 4 MG/2ML IJ SOLN
4.0000 mg | Freq: Four times a day (QID) | INTRAMUSCULAR | Status: DC | PRN
  Administered 2023-02-25 – 2023-02-26 (×6): 4 mg via INTRAVENOUS
  Filled 2023-02-24 (×4): qty 2

## 2023-02-24 MED ORDER — CHLORHEXIDINE GLUCONATE CLOTH 2 % EX PADS
6.0000 | MEDICATED_PAD | Freq: Every day | CUTANEOUS | Status: DC
  Administered 2023-02-25 – 2023-02-27 (×3): 6 via TOPICAL

## 2023-02-24 MED ORDER — POTASSIUM CHLORIDE 10 MEQ/100ML IV SOLN
10.0000 meq | INTRAVENOUS | Status: AC
  Administered 2023-02-24 (×2): 10 meq via INTRAVENOUS
  Filled 2023-02-24: qty 100

## 2023-02-24 MED ORDER — ONDANSETRON HCL 4 MG/2ML IJ SOLN
8.0000 mg | Freq: Once | INTRAMUSCULAR | Status: AC
  Administered 2023-02-24: 8 mg via INTRAVENOUS
  Filled 2023-02-24: qty 4

## 2023-02-24 MED ORDER — IOHEXOL 300 MG/ML  SOLN
100.0000 mL | Freq: Once | INTRAMUSCULAR | Status: AC | PRN
  Administered 2023-02-24: 75 mL via INTRAVENOUS

## 2023-02-24 NOTE — Telephone Encounter (Signed)
Called patient back and instructed her to come in to office now to be seen/evaluated. She will call her transportation for earlier pick up.

## 2023-02-24 NOTE — ED Notes (Signed)
Called Greggory Stallion at [facility] and confirmed that pt is in queue for bed placement per Dr. Myrle Sheng, oncology previously spoken with hospitalist-accepting Dr. Robb Matar 17:29

## 2023-02-24 NOTE — ED Triage Notes (Signed)
From cancer center, c/o abd pain x2 days. Hx stage 4 colorectal cancer. Given morphine, mag, and 2 runs of K+ at [facility] pta.

## 2023-02-24 NOTE — ED Notes (Signed)
[facility] at bedside to transport pt to [facility]

## 2023-02-24 NOTE — Progress Notes (Signed)
[facility] OFFICE PROGRESS NOTE   Diagnosis: Colon cancer  INTERVAL HISTORY:   April [name] returns prior to scheduled follow-up for evaluation of nausea/vomiting/abdominal pain.  She completed cycle 4 irinotecan/Panitumumab 02/17/2023.  She reports worsening abdominal pain.  Pain is mainly located at the upper abdomen.  She is taking hydrocodone frequently with no improvement.  She has nausea and vomits periodically.  Appetite is poor.  Bowels moved yesterday.  She notes her urine is dark.  No dysuria.  She complains of dyspnea on exertion, cough and sweats.  No fever.    Objective:  Vital signs in last 24 hours:  Blood pressure (!) 142/90, pulse (!) 122, temperature 98.1 F (36.7 C), temperature source Temporal, resp. rate 18, height 5\' 4"  (1.626 m), weight 130 lb 9.6 oz (59.2 kg), last menstrual period 01/07/2006, SpO2 100%.    HEENT: White coating over tongue.  Mucous membranes appear moist. Resp: Lungs clear bilaterally. Cardio: Regular, tachycardic. GI: Abdomen is soft, distended.  Hypoactive bowel sounds.  Tender mid upper abdomen. Vascular: No leg edema. Skin: Mild decrease in skin turgor.  Palms without erythema. Left upper extremity PICC without erythema.  Lab Results:  Lab Results  Component Value Date   WBC 8.3 02/17/2023   HGB 11.0 (L) 02/17/2023   HCT 33.5 (L) 02/17/2023   MCV 91.3 02/17/2023   PLT 342 02/17/2023   NEUTROABS 4.8 02/17/2023    Imaging:  No results found.  Medications: I have reviewed the patient's current medications.  Assessment/Plan: Sigmoid colon cancer, stage IV (pT4a,pN2b,M1c) Colonoscopy 03/24/2019-3 rectal polyps-hyperplastic polyps, distal colon biopsy-at least intramucosal adenocarcinoma, completely obstructing mass in the distal sigmoid colon, could not be traversed, intact mismatch repair protein expression 03/24/2019-CEA 56.1 03/29/2019 CT abdomen/pelvis-circumferential thickening involving the entire mid and  distal sigmoid colon to the level of the rectosigmoid junction, solid/cystic mass in the left ovary, bilateral nephrolithiasis Robotic assisted low anterior resection, mesenteric lymphadenectomy, bilateral salpingo-oophorectomy 04/08/2019 Pathology ([facility] review of outside pathology) metastatic adenocarcinoma involving the peritoneum overlying the round ligament, serosa of the urinary bladder, serosa of the right ureter a sacral area, pelvic peritoneum, and left ovary.  Omental biopsy with focal mucin pools with no carcinoma cells identified, right hemidiaphragm biopsy involved by metastatic adenocarcinoma, invasive adenocarcinoma the sigmoid colon, moderately differentiated, T4a, perineural and vascular invasion present, 7/22 lymph nodes, multiple tumor deposits, resection margins negative Negative for PD-L1, low probability of MSI-high, HER-2 negative, negative for BRAF, NRAS and KRAS alterations CTs 05/19/2019-no evidence of metastatic disease, findings suspicious for colitis of the transverse and ascending colon, small amount of ascites in the cul-de-sac, bilateral renal calculi Cycle 1 FOLFIRI 05/24/2019, bevacizumab added with cycle 2 Cycle 4 FOLFIRI/bevacizumab 07/05/2019 Cycle 5 FOLFIRI 07/27/2019, bevacizumab held secondary to hypertension Cycle 6 FOLFIRI 08/10/1999, bevacizumab held secondary to hypertension CTs 08/30/2019-no evidence of recurrent disease, new subsolid right lower lobe nodule felt to be inflammatory, emphysema Cycle 7 FOLFIRI 09/26/2019, bevacizumab remains on hold secondary to hypertension Cycle 8 FOLFIRI 10/17/2019, bevacizumab held secondary to hypertension, Udenyca added for neutropenia Cycle 9 FOLFIRI 11/07/2019, bevacizumab held secondary to hypertension, Udenyca Cycle 10 FOLFIRI 11/28/2019, bevacizumab held, Udenyca Cycle 11 FOLFIRI 12/26/2019, bevacizumab held, Udenyca CTs 01/25/2020-no evidence of recurrent disease, resolution of right lower lobe nodule Maintenance Xeloda  beginning 02/06/2020 CTs 04/25/2020- no evidence of metastatic disease, multiple bilateral renal calculi without hydronephrosis Maintenance Xeloda continued CT abdomen/pelvis without contrast 08/10/2020-bilateral staghorn renal calculi, no evidence of metastatic disease; addendum 08/23/2020-small but increasing omental nodules. Cycle  1 FOLFIRI/panitumumab 09/19/2020 Cycle 2 FOLFIRI/Panitumumab 10/03/2020 Cycle 3 FOLFIRI/Panitumumab 10/17/2020 Cycle 4 FOLFIRI/panitumumab 10/31/2020 Cycle 5 FOLFIRI/Panitumumab 11/14/2020 CTs 11/26/2020-stable omental metastases compared to 10/22/2020, mildly decreased from 08/10/2020.  Left lower quadrant tiny paracolic gutter implant slightly decreased from CT 08/10/2020.  No new or progressive metastatic disease in the abdomen or pelvis. Cycle 6 FOLFIRI/Panitumumab 11/28/2020, irinotecan dose reduced, treatment schedule adjusted to every 3 weeks going forward Cycle 7 FOLFIRI/panitumumab 12/24/2020 Cycle 8 FOLFIRI/Panitumumab 01/16/2021--treatment held due to hypertension in the infusion area. Cycle 8 FOLFIRI/panitumumab 01/21/2021 Cycle 9 FOLFIRI/panitumumab 02/11/2021 Cycle 10 FOLFIRI/Panitumumab 03/04/2021 Cycle 11 FOLFIRI/Panitumumab 03/25/2021 CT abdomen/pelvis 04/10/2021-stable right omental and left posterior paracolic gutter nodules Cycle 12 FOLFIRI/panitumumab 04/15/2021 Cycle 13 FOLFIRI/panitumumab 05/06/2021 Cycle 14 FOLFIRI/Panitumumab 05/27/2021 Cycle 15 FOLFIRI/Panitumumab 06/17/2021 CT abdomen/pelvis 07/05/2021-slight enlargement of a dominant right omental implant, no new implants Patient requested a treatment break CT abdomen/pelvis 10/04/2021-mild increase in size of multiple omental soft tissue nodules, 4 mm calculus in the distal left ureter adjacent to the ureteral stent Cycle 1 Lonsurf 10/14/2021 10/28/2021 Avastin every 2 weeks Cycle 2 Lonsurf 11/11/2021 Cycle 3 Lonsurf 12/09/2021 Cycle 4 Lonsurf 01/06/2022 Avastin held 01/20/2022 due to proteinuria, 24-hour  urine 43 mg CT/pelvis 01/30/2022-possible new peritoneal implant at the transverse colon, no significant change in other omental/peritoneal implants, stable chronic rectal wall thickening, status post removal of double-J left ureteral stent with no evidence of hydronephrosis or ureteral calculus, nonobstructing bilateral renal calculi Cycle 5 Lonsurf 02/05/2022 or 02/06/2022 Avastin every 2 weeks Cycle 6 Lonsurf 03/03/2022 CTs 04/05/2022-increase in size and number of peritoneal implants compared to January 2024 central liver mass narrowing the anterior branch of the right portal vein, decreased biliary duct dilation, biliary stents in place, stable right cardiophrenic lymph nodes, separate pigtail stent in the lumen of the proximal duodenum Cycle 1 FOLFOX 04/14/2022 CT abdomen/pelvis 04/16/2022-stent within the third portion of the duodenum, stable peritoneal nodules, indwelling biliary stents with mild left biliary duct dilation Cycle 2 FOLFOX 04/28/2022, Emend and prophylactic dexamethasone added Cycle 3 FOLFOX 05/12/2022, Aloxi, Emend, prophylactic dexamethasone, Compazine and lorazepam as needed Chemotherapy held 05/25/2020 due to severe hypertension, evaluated in the emergency department Cycle 4 FOLFOX 06/03/2022 Cycle 5 FOLFOX 06/16/2022, oxaliplatin dose reduced and 5-FU bolus eliminated due to neutropenia CT 06/25/2022-unchanged soft tissue at the hepatic hilum, unchanged, bile duct stent, decreased size of peritoneal and omental nodules and diminished volume of likely fluid in the lower left pelvis Cycle 6 FOLFOX 06/30/2022 Cycle 7 FOLFOX 07/21/2022 CT 07/24/2022 (in the emergency department with nausea, headache, abdominal pain)-no acute pathology.  No significant interval change in omental nodularity. Cycle 8 FOLFOX 08/04/2022, pruritus on the palms at the end of the oxaliplatin infusion, Pepcid and Benadryl given, symptoms resolved, infusion not resumed Cycle 9 FOLFOX 08/18/2022-Pepcid and Benadryl added to  premedications, Oxaliplatin diluted in a larger volume and infusion time increased Cycle 10 FOLFOX 09/08/2022 Cycle 11 FOLFOX 09/22/2022 10/06/2022 patient declined treatment CTs 10/15/2022-similar soft tissue fullness in the hepatic hilum with biliary stents in place, decrease in omental/peritoneal metastases Maintenance capecitabine 10/26/2022 CT abdomen/pelvis 11/19/2022-mild increase in tumor studding at the left paracolic gutter and omentum, circumferential wall thickening of the rectum, biliary stents in place with minimal increase in intrahepatic biliary dilation, nonobstructive renal calculi Colonoscopy 11/23/2022-anastomosis at 15 cm from the anal verge-patent, 2 cm polypoid tissue at the anastomosis biopsied, invasive adenocarcinoma moderately differentiated, preserved expression of major MMR proteins CTs 12/15/2022: Possible developing nodule in the lateral left liver, increased omental nodularity, persistent wall thickening in  the upper rectum CT abdomen/pelvis 12/24/2022: Increased thickened fold in the transverse colon-infectious/inflammatory, 2.1 cm lateral left liver lesion, potential new 5 mm segment 5 lesion, stable biliary stents with intrahepatic biliary dilation, central biliary wall thickening near the hepatic hilum, multiple peritoneal/omental implants unchanged from 12/15/2022, but likely larger than 11/19/2022 Cycle 1 irinotecan/Panitumumab 01/06/2023 Cycle 2 irinotecan/Panitumumab 01/20/2023 CTs 01/24/2023-slight increase in omental nodularity. Cycle 3 irinotecan/Panitumumab 02/03/2023 Cycle 4 irinotecan/panitumumab 02/17/2023   Hypertension G4 P3, twins Kidney stones-bilateral staghorn renal calculi on CT 08/10/2020 Infected Port-A-Cath 09/12/2019-placed on Augmentin, referred for Port-A-Cath removal; Port-A-Cath removed 09/14/2019; PICC line placed 09/14/2019; culture staph aureus.  Course of Septra completed. Neutropenia secondary to chemotherapy-Udenyca added with cycle 8 FOLFIRI Right  nephrostomy tube 09/12/2020; stent placed 10/09/2020, percutaneous nephrostolithotomy treatment of right-sided kidney stones 10/09/2020, malpositioned right ureter stent replacement 10/23/2020, right ureter stent removed 10/29/2020 Percutaneous left nephrostolithotomy 10/01/2021-no renal stone identified, left ureter stent left in place Cystoscopy 10/14/2021-stent removed 8.  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  Admission 04/16/2022 with nausea/vomiting and abdominal pain, felt to be acute toxicity related to chemotherapy, discharged home 04/18/2022 10.  Admission 11/19/2022 with nausea/vomiting and abdominal pain-improved 11.  Admission 12/15/2022 with fever and right submandibular swelling/tenderness CT neck 12/15/2022: 2 mm distal right submandibular duct calculus with mild inflammatory change adjacent to the right submandibular gland 12.  Admission 12/24/2022 with a fever nausea/vomiting, diarrhea, Klebsiella bacteremia completed course of Augmentin    Disposition: April [name] has completed 4 cycles of irinotecan/Panitumumab.  She presents today with nausea/vomiting and worsening abdominal pain.  We are concerned for an obstruction, possibly gastric outlet obstruction.  We have initiated IV fluids and obtained labs.  She will receive a dose of morphine 2 mg IV and Zofran 8 mg IV.  We are making arrangements for hospital admission for further evaluation.  She agrees with this plan.  Patient seen with Dr.  Truett Perna.    Lonna Cobb ANP/GNP-BC   02/24/2023  12:13 PM  This was a shared visit with Lonna Cobb.  April [name] was interviewed and examined.  She completed another cycle of irinotecan/panitumumab on 02/17/2023.  She presents today with intractable nausea/vomiting and abdominal pain.  Her symptoms are concerning for a bowel obstruction.  We reviewed recent CT images.  There appears to be thickening at the gastric antrum.  I contacted the hospitalist service to arrange for hospital admission.  We will begin intravenous fluids and magnesium/potassium replacement while waiting on a hospital bed.  I recommend considering a repeat CT abdomen/pelvis versus an upper GI study.  Irinotecan/panitumumab will be discontinued if imaging confirms disease progression.  I was present for greater than 50% of today's visit.  I performed medical decision making.  Mancel Bale, MD

## 2023-02-24 NOTE — Telephone Encounter (Signed)
April [name] called to ask if she can be seen by MD today when she is here for PICC dressing change at 2:30 pm.  Reports severe abdominal pain feeling "raw" inside that worsened yesterday and her pain meds are not working. Having nausea w/emesis several times yesterday and once last night. Last BM was yesterday. Abdomen does not feel distended. No fever. Also feels like when she swallows that there is a sensation of delay in passage of food/liquid to stomach and feels "stuck".

## 2023-02-24 NOTE — ED Provider Notes (Signed)
[place] EMERGENCY DEPARTMENT AT [facility] Provider Note   CSN: [id number] Arrival date & time: 02/24/23  1646     History  Chief Complaint  Patient presents with   Abdominal Pain    April [name] is a 48 y.o. female.  Patient with history of colorectal cancer, stage IV, currently on chemotherapy --presents to the emergency department today for evaluation of upper abdominal pain and vomiting.  She has had several days of worse than baseline upper abdominal pain with difficulty swallowing.  She is feels like she has to arch her back sometimes when she swallows to get food to pass.  Other times she will have regurgitation, or if the food sits in her stomach for too long she will vomit.  Patient was seen at her oncology office upstairs earlier today.  She had labs drawn.  She was found to have hypokalemia and was started on IV potassium.  Team upstairs had spoken with hospitalist.  As the office was closing, patient was brought down to the emergency department.  Recommendation was for CT imaging to evaluate for possible obstruction or gastric outlet obstruction.  Patient is on chronic morphine which has not been helping.       Home Medications Prior to Admission medications   Medication Sig Start Date End Date Taking? Authorizing Provider  amLODipine (NORVASC) 10 MG tablet Take 1 tablet (10 mg total) by mouth daily. Patient not taking: Reported on 02/24/2023 11/24/22   Rodolph Bong, MD  dexamethasone (DECADRON) 4 MG tablet Take 1 tablet by mouth twice a day for 3 days beginning day 2 of chemo. 02/03/23   Rana Snare, NP  dicyclomine (BENTYL) 20 MG tablet Take 1 tablet (20 mg total) by mouth 3 (three) times daily as needed for spasms (rectal/AB pain). Patient not taking: Reported on 02/24/2023 02/17/23   Ladene Artist, MD  doxycycline (VIBRA-TABS) 100 MG tablet Take 1 tablet (100 mg total) by mouth 2 (two) times daily. Patient not taking: Reported on  02/24/2023 02/17/23   Ladene Artist, MD  HYDROcodone-acetaminophen [facility]) 10-325 MG tablet Take 1 tablet by mouth every 4 (four) hours as needed for moderate pain (pain score 4-6) or severe pain (pain score 7-10). 02/16/23   Ladene Artist, MD  lactulose (CHRONULAC) 10 GM/15ML solution Take 15-30 mLs by mouth daily as needed for mild constipation. Patient not taking: Reported on 02/24/2023    [provider]  magic mouthwash (nystatin, diphenhydrAMINE, alum & mag hydroxide) suspension mixture Swish and Spit 5 mLs by mouth 4 (four) times daily as needed. Patient not taking: Reported on 02/24/2023 02/03/23   Rana Snare, NP  metoprolol tartrate (LOPRESSOR) 50 MG tablet Take 1 tablet (50 mg total) by mouth 2 (two) times daily. Patient not taking: Reported on 02/24/2023 11/24/22   Rodolph Bong, MD  morphine (MS CONTIN) 30 MG 12 hr tablet Take 1 tablet (30 mg total) by mouth every 12 (twelve) hours. 01/15/23   Rana Snare, NP  naproxen sodium (ALEVE) 220 MG tablet Take 440 mg by mouth daily as needed. Patient not taking: Reported on 02/24/2023    [provider]  ondansetron (ZOFRAN) 8 MG tablet Take 1 tablet (8 mg total) by mouth every 8 (eight) hours as needed for nausea or vomiting. Can start taking 72 hours after chemotherapy 02/03/23   Rana Snare, NP  pantoprazole (PROTONIX) 40 MG tablet Take 1 tablet (40 mg total) by mouth daily. Patient not taking:  Reported on 02/24/2023 01/27/23   Pokhrel, Rebekah Chesterfield, MD  potassium chloride (MICRO-K) 10 MEQ CR capsule Take 2 capsules (20 mEq total) by mouth 2 (two) times daily. Patient not taking: Reported on 02/24/2023 11/24/22   Rodolph Bong, MD  prochlorperazine (COMPAZINE) 10 MG tablet Take 1 tablet (10 mg total) by mouth every 6 (six) hours as needed for nausea or vomiting. 02/03/23   Rana Snare, NP      Allergies    Lisinopril, Irinotecan, Leucovorin calcium, and Oxaliplatin    Review of Systems   Review of  Systems  Physical Exam Updated Vital Signs BP (!) 141/97 (BP Location: Right Arm)   Pulse 81   Temp 98.2 F (36.8 C) (Temporal)   Resp 18   LMP 01/07/2006   SpO2 97%   Physical Exam Vitals and nursing note reviewed.  Constitutional:      General: She is not in acute distress.    Appearance: She is well-developed.  HENT:     Head: Normocephalic and atraumatic.     Right Ear: External ear normal.     Left Ear: External ear normal.     Nose: Nose normal.     Mouth/Throat:     Mouth: Mucous membranes are dry.  Eyes:     Conjunctiva/sclera: Conjunctivae normal.  Cardiovascular:     Rate and Rhythm: Normal rate and regular rhythm.     Heart sounds: No murmur heard. Pulmonary:     Effort: No respiratory distress.     Breath sounds: No wheezing, rhonchi or rales.  Abdominal:     Palpations: Abdomen is soft.     Tenderness: There is abdominal tenderness. There is no guarding or rebound. Negative signs include Murphy's sign and McBurney's sign.     Comments: Patient with tenderness to light palpation over the entire abdomen, worse in the epigastrium and left upper quadrant.  Musculoskeletal:     Cervical back: Normal range of motion and neck supple.     Right lower leg: No edema.     Left lower leg: No edema.  Skin:    General: Skin is warm and dry.     Findings: No rash.  Neurological:     General: No focal deficit present.     Mental Status: She is alert. Mental status is at baseline.     Motor: No weakness.  Psychiatric:        Mood and Affect: Mood normal.     ED Results / Procedures / Treatments   Labs (all labs ordered are listed, but only abnormal results are displayed) Labs Reviewed  URINALYSIS, ROUTINE W REFLEX MICROSCOPIC    EKG None  Radiology No results found.  Procedures Procedures    Medications Ordered in ED Medications  HYDROmorphone (DILAUDID) injection 1 mg (has no administration in time range)  iohexol (OMNIPAQUE) 300 MG/ML solution 100  mL (has no administration in time range)    ED Course/ Medical Decision Making/ A&P    Patient seen and examined. History obtained directly from patient. Work-up including labs ordered in oncology office today reviewed.  Reviewed oncology note and plan.  Labs/EKG: Independently reviewed and interpreted.  This included: CBC with normal white blood cell count, hemoglobin low at 10.8 but at baseline; CMP with hypokalemia 2.8, slightly low sodium at 134, chloride 93, bicarb elevated at 34, baseline creatinine and BUN, alkaline phosphatase stable at 422; magnesium slightly low; lipase normal.  Imaging: Ordered CT abdomen pelvis  Medications/Fluids: Ordered: IV fluids,  IV Dilaudid for pain control.  Most recent vital signs reviewed and are as follows: BP (!) 141/97 (BP Location: Right Arm)   Pulse 81   Temp 98.2 F (36.8 C) (Temporal)   Resp 18   LMP 01/07/2006   SpO2 97%   Initial impression: Worsening upper abdominal pain and dysphagia in setting of stage IV colon cancer.   Confirmed admission and bed request in process with [facility] and directly with Dr. Robb Matar, hospitalist.                                  Medical Decision Making Amount and/or Complexity of Data Reviewed Labs: ordered. Radiology: ordered.  Risk Prescription drug management. Decision regarding hospitalization.   For this patient's complaint of abdominal pain, the following conditions were considered on the differential diagnosis: gastritis/PUD, enteritis/duodenitis, appendicitis, cholelithiasis/cholecystitis, cholangitis, pancreatitis, ruptured viscus, colitis, diverticulitis, small/large bowel obstruction, proctitis, cystitis, pyelonephritis, ureteral colic, aortic dissection, aortic aneurysm. In women, pelvic inflammatory disease, ovarian cysts, and tubo-ovarian abscess were also considered. Atypical chest etiologies were also considered including ACS, PE, and pneumonia.         Final Clinical  Impression(s) / ED Diagnoses Final diagnoses:  Upper abdominal pain  Nausea and vomiting, unspecified vomiting type    Rx / DC Orders ED Discharge Orders     None         Renne Crigler, PA-C 02/24/23 1752    Vanetta Mulders, MD 02/26/23 1340

## 2023-02-24 NOTE — Progress Notes (Signed)
@   1645 transported patient to ER room #7 via w/c with NS infusion and last of KCl run going. Report given to Georgeanna Harrison, RN. Awaiting bed for [facility] for abdominal pain R/U bowel obstruction. Patient was in stable condition.

## 2023-02-24 NOTE — H&P (Signed)
History and Physical    Patient: April [name] UJW:[id number] DOB: Nov 21, 1975 DOA: 02/24/2023 DOS: the patient was seen and examined on 02/24/2023 PCP: Tollie Eth, NP  Patient coming from: {Point_of_Origin:[address]}  Chief Complaint:  Chief Complaint  Patient presents with   Abdominal Pain   HPI: April [name] is a 48 y.o. female with medical history significant of ***  Review of Systems: {ROS_Text:26778} Past Medical History:  Diagnosis Date   Ascending aortic aneurysm (HCC) 12/16/2022   Benign essential hypertension 10/06/2017   Last Assessment & Plan:  Formatting of this note might be different from the original. Patient's blood pressure noted to be elevated at today's visit 188/98.  Patient will be provided with antihypertensives in the treatment center if needed in order to meet her Avastin parameters.  Patient recommended to follow up with her primary care physician regarding medication management.  Patient is not cur   Cancer [facility])    Change in bowel habits 06/28/2022   Colon cancer (HCC)    History of colon cancer 06/28/2022   Hypertension    Prolonged QT interval 01/07/2018   Renal calculi    bilateral   Sepsis due to undetermined organism (HCC) 12/24/2022   Sialoadenitis of submandibular gland 12/16/2022   Past Surgical History:  Procedure Laterality Date   BILIARY BRUSHING  03/24/2022   Procedure: BILIARY BRUSHING;  Surgeon: Lemar Lofty., MD;  Location: [facility] ENDOSCOPY;  Service: Gastroenterology;;   BILIARY DILATION  03/24/2022   Procedure: BILIARY DILATION;  Surgeon: Lemar Lofty., MD;  Location: [facility] ENDOSCOPY;  Service: Gastroenterology;;   BILIARY STENT PLACEMENT  03/24/2022   Procedure: BILIARY STENT PLACEMENT;  Surgeon: Lemar Lofty., MD;  Location: [facility] ENDOSCOPY;  Service: Gastroenterology;;   BIOPSY  03/24/2022   Procedure: BIOPSY;  Surgeon: Lemar Lofty., MD;  Location: [facility] ENDOSCOPY;  Service:  Gastroenterology;;   BIOPSY  11/23/2022   Procedure: BIOPSY;  Surgeon: Hilarie Fredrickson, MD;  Location: [facility] ENDOSCOPY;  Service: Gastroenterology;;   CESAREAN SECTION     x2   COLONOSCOPY N/A 11/23/2022   Procedure: COLONOSCOPY;  Surgeon: Hilarie Fredrickson, MD;  Location: [facility] ENDOSCOPY;  Service: Gastroenterology;  Laterality: N/A;   CYSTOSCOPY WITH RETROGRADE PYELOGRAM, URETEROSCOPY AND STENT PLACEMENT Right 10/22/2020   Procedure: CYSTOSCOPY WITH RETROGRADE PYELOGRAM, URETEROSCOPY AND STENT PLACEMENT;  Surgeon: Noel Christmas, MD;  Location: [facility] ORS;  Service: Urology;  Laterality: Right;   ERCP N/A 03/24/2022   Procedure: ENDOSCOPIC RETROGRADE CHOLANGIOPANCREATOGRAPHY (ERCP);  Surgeon: Lemar Lofty., MD;  Location: [facility] ENDOSCOPY;  Service: Gastroenterology;  Laterality: N/A;   IR PATIENT EVAL TECH 0-60 MINS  12/15/2022   IR RADIOLOGIST EVAL & MGMT  09/16/2019   IR RADIOLOGIST EVAL & MGMT  09/23/2019   IR RADIOLOGIST EVAL & MGMT  09/20/2019   IR RADIOLOGIST EVAL & MGMT  09/30/2019   IR REMOVAL TUN ACCESS W/ PORT W/O FL MOD SED  09/14/2019   IR VENO/EXT/UNI RIGHT  04/11/2022   PANCREATIC STENT PLACEMENT  03/24/2022   Procedure: PANCREATIC STENT PLACEMENT;  Surgeon: Lemar Lofty., MD;  Location[facility] ENDOSCOPY;  Service: Gastroenterology;;   REMOVAL OF STONES  03/24/2022   Procedure: REMOVAL OF STONES;  Surgeon: Lemar Lofty., MD;  Location: [facility] ENDOSCOPY;  Service: Gastroenterology;;   Dennison Mascot  03/24/2022   Procedure: Dennison Mascot;  Surgeon: Lemar Lofty., MD;  Location: [facility] ENDOSCOPY;  Service: Gastroenterology;;   URETERAL STENT PLACEMENT     Social History:  reports that she  has quit smoking. Her smoking use included cigarettes. She has never used smokeless tobacco. She reports that she does not currently use alcohol. She reports that she does not currently use drugs after having used the following drugs: Marijuana.  Allergies  Allergen Reactions   Lisinopril  Swelling and Other (See Comments)    Lip swelling and Angioedema   Irinotecan Other (See Comments)    Stomach cramping Patient given 0.5 mL atropine IV and was able to continue infusion.  See Progress note on 10/03/20.    Leucovorin Calcium Itching    Patient reported itchy palms towards end of transfusion, see progress note 08/04/22.    Oxaliplatin Itching    Patient reported itchy palms towards end of transfusion, see progress note on 08/04/22.    Family History  Problem Relation Age of Onset   Hypertension Mother    Diabetes Mellitus II Father    Stroke Father    Ulcers Sister    Colon cancer Neg Hx    Esophageal cancer Neg Hx    Inflammatory bowel disease Neg Hx    Liver disease Neg Hx    Pancreatic cancer Neg Hx    Rectal cancer Neg Hx    Stomach cancer Neg Hx     Prior to Admission medications   Medication Sig Start Date End Date Taking? Authorizing Provider  amLODipine (NORVASC) 10 MG tablet Take 1 tablet (10 mg total) by mouth daily. Patient not taking: Reported on 02/24/2023 11/24/22   Rodolph Bong, MD  dexamethasone (DECADRON) 4 MG tablet Take 1 tablet by mouth twice a day for 3 days beginning day 2 of chemo. 02/03/23   Rana Snare, NP  dicyclomine (BENTYL) 20 MG tablet Take 1 tablet (20 mg total) by mouth 3 (three) times daily as needed for spasms (rectal/AB pain). Patient not taking: Reported on 02/24/2023 02/17/23   Ladene Artist, MD  doxycycline (VIBRA-TABS) 100 MG tablet Take 1 tablet (100 mg total) by mouth 2 (two) times daily. Patient not taking: Reported on 02/24/2023 02/17/23   Ladene Artist, MD  HYDROcodone-acetaminophen [facility]) 10-325 MG tablet Take 1 tablet by mouth every 4 (four) hours as needed for moderate pain (pain score 4-6) or severe pain (pain score 7-10). 02/16/23   Ladene Artist, MD  lactulose (CHRONULAC) 10 GM/15ML solution Take 15-30 mLs by mouth daily as needed for mild constipation. Patient not taking: Reported on 02/24/2023    [provider]  magic mouthwash (nystatin, diphenhydrAMINE, alum & mag hydroxide) suspension mixture Swish and Spit 5 mLs by mouth 4 (four) times daily as needed. Patient not taking: Reported on 02/24/2023 02/03/23   Rana Snare, NP  metoprolol tartrate (LOPRESSOR) 50 MG tablet Take 1 tablet (50 mg total) by mouth 2 (two) times daily. Patient not taking: Reported on 02/24/2023 11/24/22   Rodolph Bong, MD  morphine (MS CONTIN) 30 MG 12 hr tablet Take 1 tablet (30 mg total) by mouth every 12 (twelve) hours. 01/15/23   Rana Snare, NP  naproxen sodium (ALEVE) 220 MG tablet Take 440 mg by mouth daily as needed. Patient not taking: Reported on 02/24/2023    [provider]  ondansetron (ZOFRAN) 8 MG tablet Take 1 tablet (8 mg total) by mouth every 8 (eight) hours as needed for nausea or vomiting. Can start taking 72 hours after chemotherapy 02/03/23   Rana Snare, NP  pantoprazole (PROTONIX) 40 MG tablet Take 1 tablet (40 mg total) by mouth daily.  Patient not taking: Reported on 02/24/2023 01/27/23   Pokhrel, Rebekah [place], MD  potassium chloride (MICRO-K) 10 MEQ CR capsule Take 2 capsules (20 mEq total) by mouth 2 (two) times daily. Patient not taking: Reported on 02/24/2023 11/24/22   Rodolph Bong, MD  prochlorperazine (COMPAZINE) 10 MG tablet Take 1 tablet (10 mg total) by mouth every 6 (six) hours as needed for nausea or vomiting. 02/03/23   Rana Snare, NP    Physical Exam: Vitals:   02/24/23 1653 02/24/23 1930 02/24/23 1945 02/24/23 2231  BP: (!) 141/97 (!) 139/98 (!) 133/92 (!) 129/96  Pulse: 81 80 81 81  Resp: 18 18 18 14   Temp: 98.2 F (36.8 C)   98.8 F (37.1 C)  TempSrc: Temporal   Oral  SpO2: 97% 99% 98% 100%   *** Data Reviewed: {Tip this will not be part of the note when signed- Document your independent interpretation of telemetry tracing, EKG, lab, Radiology test or any other diagnostic tests. Add any new diagnostic test ordered  today. (Optional):26781} {Results:26384}  Assessment and Plan: No notes have been filed under this hospital service. Service: Hospitalist     Advance Care Planning:   Code Status: Full Code ***  Consults: ***  Family Communication: ***  Severity of Illness: {Observation/Inpatient:21159}  AuthorLonia Blood, MD 02/24/2023 10:35 PM  For on call review [email or web address].

## 2023-02-25 ENCOUNTER — Other Ambulatory Visit: Payer: Self-pay

## 2023-02-25 DIAGNOSIS — R142 Eructation: Secondary | ICD-10-CM | POA: Diagnosis not present

## 2023-02-25 DIAGNOSIS — Z87442 Personal history of urinary calculi: Secondary | ICD-10-CM | POA: Diagnosis not present

## 2023-02-25 DIAGNOSIS — C240 Malignant neoplasm of extrahepatic bile duct: Secondary | ICD-10-CM | POA: Diagnosis not present

## 2023-02-25 DIAGNOSIS — K831 Obstruction of bile duct: Secondary | ICD-10-CM | POA: Diagnosis present

## 2023-02-25 DIAGNOSIS — R748 Abnormal levels of other serum enzymes: Secondary | ICD-10-CM | POA: Diagnosis present

## 2023-02-25 DIAGNOSIS — I700 Atherosclerosis of aorta: Secondary | ICD-10-CM | POA: Diagnosis present

## 2023-02-25 DIAGNOSIS — Z9889 Other specified postprocedural states: Secondary | ICD-10-CM | POA: Diagnosis not present

## 2023-02-25 DIAGNOSIS — D630 Anemia in neoplastic disease: Secondary | ICD-10-CM | POA: Diagnosis present

## 2023-02-25 DIAGNOSIS — Z823 Family history of stroke: Secondary | ICD-10-CM | POA: Diagnosis not present

## 2023-02-25 DIAGNOSIS — Z79899 Other long term (current) drug therapy: Secondary | ICD-10-CM | POA: Diagnosis not present

## 2023-02-25 DIAGNOSIS — R1013 Epigastric pain: Secondary | ICD-10-CM | POA: Diagnosis not present

## 2023-02-25 DIAGNOSIS — R112 Nausea with vomiting, unspecified: Secondary | ICD-10-CM | POA: Diagnosis not present

## 2023-02-25 DIAGNOSIS — Z888 Allergy status to other drugs, medicaments and biological substances status: Secondary | ICD-10-CM | POA: Diagnosis not present

## 2023-02-25 DIAGNOSIS — K2289 Other specified disease of esophagus: Secondary | ICD-10-CM | POA: Diagnosis present

## 2023-02-25 DIAGNOSIS — K2970 Gastritis, unspecified, without bleeding: Secondary | ICD-10-CM | POA: Diagnosis present

## 2023-02-25 DIAGNOSIS — K219 Gastro-esophageal reflux disease without esophagitis: Secondary | ICD-10-CM | POA: Diagnosis present

## 2023-02-25 DIAGNOSIS — E876 Hypokalemia: Secondary | ICD-10-CM | POA: Diagnosis present

## 2023-02-25 DIAGNOSIS — K3189 Other diseases of stomach and duodenum: Secondary | ICD-10-CM | POA: Diagnosis present

## 2023-02-25 DIAGNOSIS — I10 Essential (primary) hypertension: Secondary | ICD-10-CM | POA: Diagnosis present

## 2023-02-25 DIAGNOSIS — Z8249 Family history of ischemic heart disease and other diseases of the circulatory system: Secondary | ICD-10-CM | POA: Diagnosis not present

## 2023-02-25 DIAGNOSIS — N200 Calculus of kidney: Secondary | ICD-10-CM | POA: Diagnosis present

## 2023-02-25 DIAGNOSIS — K2950 Unspecified chronic gastritis without bleeding: Secondary | ICD-10-CM | POA: Diagnosis not present

## 2023-02-25 DIAGNOSIS — G893 Neoplasm related pain (acute) (chronic): Secondary | ICD-10-CM | POA: Diagnosis present

## 2023-02-25 DIAGNOSIS — K5900 Constipation, unspecified: Secondary | ICD-10-CM | POA: Diagnosis present

## 2023-02-25 DIAGNOSIS — Z87891 Personal history of nicotine dependence: Secondary | ICD-10-CM | POA: Diagnosis not present

## 2023-02-25 DIAGNOSIS — Z833 Family history of diabetes mellitus: Secondary | ICD-10-CM | POA: Diagnosis not present

## 2023-02-25 DIAGNOSIS — C189 Malignant neoplasm of colon, unspecified: Secondary | ICD-10-CM | POA: Diagnosis not present

## 2023-02-25 DIAGNOSIS — C20 Malignant neoplasm of rectum: Secondary | ICD-10-CM | POA: Diagnosis present

## 2023-02-25 DIAGNOSIS — R1010 Upper abdominal pain, unspecified: Secondary | ICD-10-CM | POA: Diagnosis present

## 2023-02-25 DIAGNOSIS — C787 Secondary malignant neoplasm of liver and intrahepatic bile duct: Secondary | ICD-10-CM | POA: Diagnosis present

## 2023-02-25 LAB — CREATININE, SERUM
Creatinine, Ser: 0.45 mg/dL (ref 0.44–1.00)
GFR, Estimated: >60 mL/min (ref >60)

## 2023-02-25 LAB — CBC
HCT: 29.2 % — ABNORMAL LOW (ref 36.0–46.0)
HCT: 32 % — ABNORMAL LOW (ref 36.0–46.0)
Hemoglobin: 10.4 g/dL — ABNORMAL LOW (ref 12.0–15.0)
Hemoglobin: 9.4 g/dL — ABNORMAL LOW (ref 12.0–15.0)
MCH: 29.9 pg (ref 26.0–34.0)
MCH: 30 pg (ref 26.0–34.0)
MCHC: 32.2 g/dL (ref 30.0–36.0)
MCHC: 32.5 g/dL (ref 30.0–36.0)
MCV: 92.2 fL (ref 80.0–100.0)
MCV: 93 fL (ref 80.0–100.0)
Platelets: 250 10*3/uL (ref 150–400)
Platelets: 308 10*3/uL (ref 150–400)
RBC: 3.14 MIL/uL — ABNORMAL LOW (ref 3.87–5.11)
RBC: 3.47 MIL/uL — ABNORMAL LOW (ref 3.87–5.11)
RDW: 15.6 % — ABNORMAL HIGH (ref 11.5–15.5)
RDW: 15.6 % — ABNORMAL HIGH (ref 11.5–15.5)
WBC: 8.1 10*3/uL (ref 4.0–10.5)
WBC: 8.6 10*3/uL (ref 4.0–10.5)
nRBC: 0 % (ref 0.0–0.2)
nRBC: 0 % (ref 0.0–0.2)

## 2023-02-25 LAB — URINALYSIS, ROUTINE W REFLEX MICROSCOPIC
Bacteria, UA: NONE SEEN
Bilirubin Urine: NEGATIVE
Glucose, UA: NEGATIVE mg/dL
Ketones, ur: NEGATIVE mg/dL
Leukocytes,Ua: NEGATIVE
Nitrite: NEGATIVE
Protein, ur: NEGATIVE mg/dL
Specific Gravity, Urine: 1.034 — ABNORMAL HIGH (ref 1.005–1.030)
pH: 7 (ref 5.0–8.0)

## 2023-02-25 LAB — COMPREHENSIVE METABOLIC PANEL
ALT: 22 U/L (ref 0–44)
AST: 29 U/L (ref 15–41)
Albumin: 2.8 g/dL — ABNORMAL LOW (ref 3.5–5.0)
Alkaline Phosphatase: 370 U/L — ABNORMAL HIGH (ref 38–126)
Anion gap: 7 (ref 5–15)
BUN: <5 mg/dL — ABNORMAL LOW (ref 6–20)
CO2: 29 mmol/L (ref 22–32)
Calcium: 8.1 mg/dL — ABNORMAL LOW (ref 8.9–10.3)
Chloride: 93 mmol/L — ABNORMAL LOW (ref 98–111)
Creatinine, Ser: 0.39 mg/dL — ABNORMAL LOW (ref 0.44–1.00)
GFR, Estimated: >60 mL/min (ref >60)
Glucose, Bld: 115 mg/dL — ABNORMAL HIGH (ref 70–99)
Potassium: 2.8 mmol/L — ABNORMAL LOW (ref 3.5–5.1)
Sodium: 129 mmol/L — ABNORMAL LOW (ref 135–145)
Total Bilirubin: 0.5 mg/dL (ref 0.0–1.2)
Total Protein: 6.4 g/dL — ABNORMAL LOW (ref 6.5–8.1)

## 2023-02-25 LAB — POTASSIUM: Potassium: 3.7 mmol/L (ref 3.5–5.1)

## 2023-02-25 MED ORDER — PROCHLORPERAZINE EDISYLATE 10 MG/2ML IJ SOLN
5.0000 mg | Freq: Once | INTRAMUSCULAR | Status: AC | PRN
  Administered 2023-02-25: 5 mg via INTRAVENOUS
  Filled 2023-02-25: qty 2

## 2023-02-25 MED ORDER — PANTOPRAZOLE SODIUM 40 MG IV SOLR
40.0000 mg | Freq: Two times a day (BID) | INTRAVENOUS | Status: DC
  Administered 2023-02-25 – 2023-02-27 (×5): 40 mg via INTRAVENOUS
  Filled 2023-02-25 (×5): qty 10

## 2023-02-25 MED ORDER — HYDROMORPHONE HCL 1 MG/ML IJ SOLN
1.0000 mg | INTRAMUSCULAR | Status: AC | PRN
  Administered 2023-02-25 (×3): 1 mg via INTRAVENOUS
  Filled 2023-02-25 (×3): qty 1

## 2023-02-25 MED ORDER — ALUM & MAG HYDROXIDE-SIMETH 200-200-20 MG/5ML PO SUSP
30.0000 mL | ORAL | Status: AC
  Administered 2023-02-25 (×3): 30 mL via ORAL
  Filled 2023-02-25 (×3): qty 30

## 2023-02-25 MED ORDER — AMLODIPINE BESYLATE 10 MG PO TABS
10.0000 mg | ORAL_TABLET | Freq: Every day | ORAL | Status: DC
  Administered 2023-02-25 – 2023-02-27 (×3): 10 mg via ORAL
  Filled 2023-02-25 (×3): qty 1

## 2023-02-25 MED ORDER — METOPROLOL TARTRATE 50 MG PO TABS
50.0000 mg | ORAL_TABLET | Freq: Two times a day (BID) | ORAL | Status: DC
Start: 2023-02-25 — End: 2023-02-27
  Administered 2023-02-25 – 2023-02-27 (×5): 50 mg via ORAL
  Filled 2023-02-25 (×5): qty 1

## 2023-02-25 MED ORDER — HYDROMORPHONE HCL 1 MG/ML IJ SOLN
0.5000 mg | INTRAMUSCULAR | Status: AC | PRN
  Administered 2023-02-25 (×3): 1 mg via INTRAVENOUS
  Filled 2023-02-25 (×3): qty 1

## 2023-02-25 MED ORDER — FAMOTIDINE 20 MG PO TABS
20.0000 mg | ORAL_TABLET | Freq: Every day | ORAL | Status: DC
  Administered 2023-02-25 – 2023-02-27 (×3): 20 mg via ORAL
  Filled 2023-02-25 (×3): qty 1

## 2023-02-25 MED ORDER — LIDOCAINE 5 % EX PTCH
1.0000 | MEDICATED_PATCH | CUTANEOUS | Status: DC
  Administered 2023-02-25 – 2023-02-26 (×2): 1 via TRANSDERMAL
  Filled 2023-02-25 (×2): qty 1

## 2023-02-25 MED ORDER — POLYETHYLENE GLYCOL 3350 17 G PO PACK
17.0000 g | PACK | Freq: Two times a day (BID) | ORAL | Status: DC
  Administered 2023-02-25 – 2023-02-27 (×4): 17 g via ORAL
  Filled 2023-02-25 (×5): qty 1

## 2023-02-25 MED ORDER — BOOST / RESOURCE BREEZE PO LIQD CUSTOM
1.0000 | Freq: Three times a day (TID) | ORAL | Status: DC
  Administered 2023-02-27: 1 via ORAL

## 2023-02-25 MED ORDER — HYDROMORPHONE HCL 1 MG/ML IJ SOLN
1.0000 mg | INTRAMUSCULAR | Status: AC | PRN
  Administered 2023-02-25 – 2023-02-26 (×3): 1 mg via INTRAVENOUS
  Filled 2023-02-25 (×3): qty 1

## 2023-02-25 MED ORDER — MAGNESIUM SULFATE 2 GM/50ML IV SOLN
2.0000 g | Freq: Once | INTRAVENOUS | Status: AC
  Administered 2023-02-25: 2 g via INTRAVENOUS
  Filled 2023-02-25: qty 50

## 2023-02-25 MED ORDER — POTASSIUM CHLORIDE 10 MEQ/100ML IV SOLN
10.0000 meq | INTRAVENOUS | Status: AC
  Administered 2023-02-25 (×3): 10 meq via INTRAVENOUS
  Filled 2023-02-25 (×2): qty 100

## 2023-02-25 MED ORDER — BISACODYL 5 MG PO TBEC: 10.0000 mg | DELAYED_RELEASE_TABLET | Freq: Every day | ORAL | Status: DC | PRN

## 2023-02-25 NOTE — Progress Notes (Addendum)
April [name]   DOB:04/20/1975   XB#:[id number]      ASSESSMENT & PLAN:  1.  Abdominal pain - Likely secondary to metastatic colon cancer - Patient reports she thought pain would have subsided by now as she has been on 3 cycles of chemotherapy. - States she does not feel that she gets any relief from MS Contin at home. - Continue Dilaudid as needed for pain  2.  Sigmoid colon cancer, stage IV  (pT4a,pN2b,M1c)  -Initially diagnosed in 2021 -CT abdomen pelvis done 12/24/2022 showed 2.1 cm heterogeneous mass left hepatic close, likely mets not previously seen on scan 11/19/2022.  Also shows multiple peritoneal/omental implants. - s/p FOLFIRI/bevacizumab held, [place] given in 2021.  From 2022 to 2023 FOLFIRI with panitumumab was given.   -Maintenance Xeloda was given in 2022.  Lonsurf was given in 2023. Then in 2024 patient received FOLFOX therapy.   -Currently on salvage therapy with irinotecan/panitumumab.  Received cycle 3 on 02/17/2023 and tolerated well.     - Now admitted with severe abdominal pain. -Medical oncology/Dr. Truett Perna following  3.  Anemia, normocytic - Likely due to malignancy plus chemotherapy treatments - Hemoglobin 9.4 today - Transfuse PRBC for hemoglobin <7.0.  No transfusional intervention warranted at this time. - Continue to monitor CBC with differential closely  4.  Hypertension - On antihypertensives at home, continue amlodipine 10 mg p.o. daily and metoprolol 50 mg p.o. twice daily. - Monitor BP levels    Code Status Full   Subjective:  Patient seen awake and alert laying supine in bed.  Reports ongoing upper abdominal pain that is the same as when she came in.  She she details abdominal pain as not subsiding during all cycles of this last chemotherapy, disappointed that chemotherapy has not taken away her pain.  Has some slight nausea.  Denies other GI symptoms or any active bleeding.  Currently on clear liquid diet which she states can  tolerate if she sips only.  No other acute distress is noted.  Objective:  Vitals:   02/25/23 0201 02/25/23 0648  BP: (!) 132/98 (!) 139/98  Pulse: 89 88  Resp: 14 14  Temp: 98.8 F (37.1 C) 99 F (37.2 C)  SpO2: 98% 100%     Intake/Output Summary (Last 24 hours) at 02/25/2023 1030 Last data filed at 02/25/2023 0825 Gross per 24 hour  Intake 2515.33 ml  Output 800 ml  Net 1715.33 ml     REVIEW OF SYSTEMS:   Constitutional: Denies fevers, chills or abnormal night sweats Eyes: Denies blurriness of vision, double vision or watery eyes Ears, nose, mouth, throat, and face: Denies mucositis or sore throat Respiratory: Denies cough, dyspnea or wheezes Cardiovascular: Denies palpitation, chest discomfort or lower extremity swelling Gastrointestinal: +Upper abdominal pain, worsens when she eats Skin: Denies abnormal skin rashes Lymphatics: Denies new lymphadenopathy or easy bruising Neurological: Denies numbness, tingling or new weaknesses Behavioral/Psych: Mood is stable, no new changes  All other systems were reviewed with the patient and are negative.  PHYSICAL EXAMINATION: ECOG PERFORMANCE STATUS: 2 - Symptomatic, <50% confined to bed  Vitals:   02/25/23 0201 02/25/23 0648  BP: (!) 132/98 (!) 139/98  Pulse: 89 88  Resp: 14 14  Temp: 98.8 F (37.1 C) 99 F (37.2 C)  SpO2: 98% 100%   Filed Weights   02/24/23 2231  Weight: 133 lb 9.6 oz (60.6 kg)    GENERAL: alert, no distress and comfortable SKIN: skin color, texture, turgor are normal, no rashes  or significant lesions EYES: normal, conjunctiva are pink and non-injected, sclera clear OROPHARYNX: no exudate, no erythema and lips, buccal mucosa, and tongue normal  NECK: supple, thyroid normal size, non-tender, without nodularity LYMPH: no palpable lymphadenopathy in the cervical, axillary or inguinal LUNGS: clear to auscultation and percussion with normal breathing effort HEART: regular rate & rhythm and no murmurs  and no lower extremity edema ABDOMEN: abdomen soft, non-tender and normal bowel sounds MUSCULOSKELETAL: no cyanosis of digits and no clubbing  PSYCH: alert & oriented x 3 with fluent speech NEURO: no focal motor/sensory deficits   All questions were answered. The patient knows to call the clinic with any problems, questions or concerns.   The total time spent in the appointment was 40 minutes encounter with patient including review of chart and various tests results, discussions about plan of care and coordination of care plan  Dawson Bills, NP 02/25/2023 10:30 AM    Labs Reviewed:  Lab Results  Component Value Date   WBC 8.1 02/25/2023   HGB 9.4 (L) 02/25/2023   HCT 29.2 (L) 02/25/2023   MCV 93.0 02/25/2023   PLT 250 02/25/2023   Recent Labs    03/27/22 1212 03/31/22 0945 11/21/22 0557 11/22/22 0710 12/16/22 0505 12/17/22 0500 02/17/23 0750 02/24/23 1235 02/25/23 0029 02/25/23 0555  NA  --    < > 138   < > 132*   < > 136 134*  --  129*  K 3.8   < > 3.5   < > 2.9*   < > 3.4* 2.8*  --  2.8*  CL  --    < > 101   < > 97*   < > 99 93*  --  93*  CO2  --    < > 24   < > 29   < > 28 34*  --  29  GLUCOSE  --    < > 139*   < > 97   < > 97 89  --  115*  BUN  --    < > 9   < > 8   < > 6 5*  --  <5*  CREATININE  --    < > 0.58   < > 0.52   < > 0.40* 0.38* 0.45 0.39*  CALCIUM  --    < > 10.3   < > 8.5*   < > 9.5 9.5  --  8.1*  GFRNONAA  --    < > >60   < > >60   < > >60 >60 >60 >60  PROT 6.8   < > 8.9*   < > 7.1   < > 7.6 7.3  --  6.4*  ALBUMIN 3.2*   < > 4.4  4.5   < > 3.2*   < > 3.7 3.5  --  2.8*  AST 78*   < > 35   < > 37   < > 33 23  --  29  ALT 172*   < > 24   < > 28   < > 26 20  --  22  ALKPHOS 506*   < > 172*   < > 204*   < > 432* 422*  --  370*  BILITOT 2.3*   < > 0.6   < > 0.5   < > 0.4 0.5  --  0.5  BILIDIR 1.1*  --  <0.1  --  0.1  --   --   --   --   --  IBILI 1.2*  --  NOT CALCULATED  --  0.4  --   --   --   --   --    < > = values in this interval not displayed.     Studies Reviewed:  CT ABDOMEN PELVIS W CONTRAST Result Date: 02/24/2023 CLINICAL DATA:  Abdominal pain EXAM: CT ABDOMEN AND PELVIS WITH CONTRAST TECHNIQUE: Multidetector CT imaging of the abdomen and pelvis was performed using the standard protocol following bolus administration of intravenous contrast. RADIATION DOSE REDUCTION: This exam was performed according to the departmental dose-optimization program which includes automated exposure control, adjustment of the mA and/or kV according to patient size and/or use of iterative reconstruction technique. CONTRAST:  75mL OMNIPAQUE IOHEXOL 300 MG/ML  SOLN COMPARISON:  01/24/2023 FINDINGS: Lower chest: No acute abnormality. Hepatobiliary: Redemonstration of subtly hypoattenuating lesion within the peripheral aspect of hepatic segment 2 (series 2, image 20). Additional scattered subcentimeter low-density lesions within the liver remain too small to characterize. Prominent intrahepatic biliary dilatation with biliary stents in place. Slightly hyperattenuating material fills the gallbladder. No pericholecystic inflammatory changes by CT. Pancreas: Stable mild pancreatic ductal dilatation. No inflammatory changes. Spleen: Normal in size without focal abnormality. Adrenals/Urinary Tract: Unremarkable adrenal glands. Stable bilateral nephrolithiasis with stones measuring up to 1.1 cm on the left and 0.9 cm on the right. No hydronephrosis. Urinary bladder within normal limits for the degree of distension. Stomach/Bowel: Stomach is within normal limits. Appendix appears normal. Similar degree of wall thickening of the rectosigmoid colon. No new sites of bowel wall thickening or inflammation. Vascular/Lymphatic: Scattered aortoiliac atherosclerotic calcifications without aneurysm. No abdominopelvic lymphadenopathy. Reproductive: Uterus and bilateral adnexa are unremarkable. Other: Similar degree of nodularity along the omentum and left pericolic gutter. No ascites or  pneumoperitoneum. Musculoskeletal: No lytic or sclerotic bone lesion. Grade 2 anterolisthesis of L5 on S1 secondary to chronic bilateral L5 pars interarticularis defects. IMPRESSION: 1. No new or acute abdominopelvic findings. 2. 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 3. Prominent intrahepatic biliary dilatation with biliary stents in place. 4. Stable bilateral nephrolithiasis. 5. Aortic atherosclerosis (ICD10-I70.0). Electronically Signed   By: Duanne Guess D.O.   On: 02/24/2023 18:49   Ms. Postlewaite was interviewed and examined.  I reviewed the admission CT findings and images.  She is admitted with increased upper abdominal pain and intermittent nausea/vomiting.  The etiology of her symptoms is unclear.  The CT does not reveal an acute change or bowel obstruction.  The peritoneal nodules appear stable and some are smaller.  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  Could there be tumor involving the stomach or gastric outlet?  There is apparent soft tissue thickening at the gastric antrum on CT.  Biliary stents are in place and the liver enzymes are normal.  I doubt the pain is related to the biliary tract or gallbladder.  She is scheduled for stent change by GI this week.  I recommend proceeding with this procedure and a concurrent endoscopy.  Recommendations: Continue narcotic analgesics for pain Resume antihypertensive regimen Continue intravenous fluids, replete potassium GI consult for evaluation of the nausea/pain and to consider proceeding with biliary stent exchange/upper endoscopy

## 2023-02-25 NOTE — Consult Note (Signed)
Consultation  Referring Provider:   [facility] Dr. Sharl Ma Primary Care Physician:  Early, Sung Amabile, NP Primary Gastroenterologist:  Dr. Meridee Score       Reason for Consultation:     AB pain DOA: 02/24/2023         Hospital Day: 2         HPI:   April [name] is a 48 y.o. female with past medical history significant for April [name] is a 48 y.o. female with a pmh significant for hypertension, nephrolithiasis, metastatic colon cancer (complicated by biliary obstruction status post ERCP with stenting), chronic pain, constipation.   Presents to the ER with persistent severe epigastric abdominal pain. Work up notable for Sodium 134, potassium 2.8, BUN 5, creatinine 0.38 Alk phos 422, lipase 20, AST 23, ALT 20, total bilirubin 0.5 WBC 8.9, Hgb 10.8, MCV 90, platelets 321 Negative influenza, unremarkable urine CT abdomen pelvis 2/11 IMPRESSION: 1. No new or acute abdominopelvic findings. 2. 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 3. Prominent intrahepatic biliary dilatation with biliary stents in place. 4. Stable bilateral nephrolithiasis. 5. Aortic atherosclerosis (ICD10-I70.0).  Patient follows with Dr. Truett Perna for metastatic rectal carcinoma has completed 4 cycles of  irinotecan/Panitumumab, last cycle being this past Tuesday.  Pain has been going on for months but progressively worse, normally pain improves after several cycles of chemotherapy but this did not make the pain better.  Pain is primarily at the top of her stomach feels that when she eats or drinks that the food or drink is "going through final" she has a lot of burping belching has had some episodes of vomiting which is normally just bubbled mucus but she has had 1 episode of undigested fish.  Denies any odynophagia or true dysphagia while eating or drinking just feels that food or drink can to stop at the top of her stomach.   States the epigastric discomfort is more constant but can be worse with a deep breath, palpation, after food. She denies GERD but has had a lot of burping.  She was on pantoprazole she states she thinks twice daily but has not taken in the last week.  She is on Aleve daily for pain as well as dexamethasone for a week around her chemo cycle. She does complain of dark and black thick hard stools 3 times daily for the last week.  She is on hydrocodone and has history of constipation.  With the pain she is sweating and nausea.  She does have cold sweats and chills at night.  Can have sweating through her shirt at night but denies objective fever.  Patient does have dark urine for the past week as well.  Patient denies palpitations, chest pain no swelling in her legs.  Abnormal ED labs: Abnormal Labs Reviewed  URINALYSIS, ROUTINE W REFLEX MICROSCOPIC - Abnormal; Notable for the following components:      Result Value   APPearance CLOUDY (*)    Specific Gravity, Urine 1.034 (*)    Hgb urine dipstick SMALL (*)    All other components within normal limits  CBC - Abnormal; Notable for the following components:   RBC 3.47 (*)    Hemoglobin 10.4 (*)    HCT 32.0 (*)    RDW 15.6 (*)    All other components within normal limits  COMPREHENSIVE METABOLIC PANEL - Abnormal; Notable for the following components:   Sodium 129 (*)    Potassium  2.8 (*)    Chloride 93 (*)    Glucose, Bld 115 (*)    BUN <5 (*)    Creatinine, Ser 0.39 (*)    Calcium 8.1 (*)    Total Protein 6.4 (*)    Albumin 2.8 (*)    Alkaline Phosphatase 370 (*)    All other components within normal limits  CBC - Abnormal; Notable for the following components:   RBC 3.14 (*)    Hemoglobin 9.4 (*)    HCT 29.2 (*)    RDW 15.6 (*)    All other components within normal limits    Past Medical History:  Diagnosis Date   Ascending aortic aneurysm (HCC) 12/16/2022   Benign essential hypertension 10/06/2017   Last Assessment & Plan:   Formatting of this note might be different from the original. Patient's blood pressure noted to be elevated at today's visit 188/98.  Patient will be provided with antihypertensives in the treatment center if needed in order to meet her Avastin parameters.  Patient recommended to follow up with her primary care physician regarding medication management.  Patient is not cur   Cancer [facility])    Change in bowel habits 06/28/2022   Colon cancer (HCC)    History of colon cancer 06/28/2022   Hypertension    Prolonged QT interval 01/07/2018   Renal calculi    bilateral   Sepsis due to undetermined organism (HCC) 12/24/2022   Sialoadenitis of submandibular gland 12/16/2022    Surgical History:  She  has a past surgical history that includes Cesarean section; Ureteral stent placement; IR REMOVAL TUN ACCESS W/ PORT W/O FL MOD SED (09/14/2019); IR Radiologist Eval & Mgmt (09/16/2019); IR Radiologist Eval & Mgmt (09/23/2019); IR Radiologist Eval & Mgmt (09/20/2019); IR Radiologist Eval & Mgmt (09/30/2019); Cystoscopy with retrograde pyelogram, ureteroscopy and stent placement (Right, 10/22/2020); ERCP (N/A, 03/24/2022); sphincterotomy (03/24/2022); pancreatic stent placement (03/24/2022); Biliary brushing (03/24/2022); removal of stones (03/24/2022); Biliary dilation (03/24/2022); biliary stent placement (03/24/2022); biopsy (03/24/2022); IR Veno/Ext/Uni Right (04/11/2022); Colonoscopy (N/A, 11/23/2022); biopsy (11/23/2022); and IR PATIENT EVAL TECH 0-60 MINS (12/15/2022). Family History:  Her family history includes Diabetes Mellitus II in her father; Hypertension in her mother; Stroke in her father; Ulcers in her sister. Social History:   reports that she has quit smoking. Her smoking use included cigarettes. She has never used smokeless tobacco. She reports that she does not currently use alcohol. She reports that she does not currently use drugs after having used the following drugs: Marijuana.  Prior to Admission  medications   Medication Sig Start Date End Date Taking? Authorizing Provider  amLODipine (NORVASC) 10 MG tablet Take 1 tablet (10 mg total) by mouth daily. 11/24/22  Yes Rodolph Bong, MD  dexamethasone (DECADRON) 4 MG tablet Take 1 tablet by mouth twice a day for 3 days beginning day 2 of chemo. 02/03/23  Yes Rana Snare, NP  dicyclomine (BENTYL) 20 MG tablet Take 1 tablet (20 mg total) by mouth 3 (three) times daily as needed for spasms (rectal/AB pain). 02/17/23  Yes Ladene Artist, MD  HYDROcodone-acetaminophen (NORCO) 10-325 MG tablet Take 1 tablet by mouth every 4 (four) hours as needed for moderate pain (pain score 4-6) or severe pain (pain score 7-10). 02/16/23  Yes Ladene Artist, MD  lactulose (CHRONULAC) 10 GM/15ML solution Take 15-30 mLs by mouth daily as needed for mild constipation.   Yes [provider]  metoprolol tartrate (LOPRESSOR) 50 MG tablet Take 1 tablet (50  mg total) by mouth 2 (two) times daily. 11/24/22  Yes Rodolph Bong, MD  morphine (MS CONTIN) 30 MG 12 hr tablet Take 1 tablet (30 mg total) by mouth every 12 (twelve) hours. Patient taking differently: Take 30 mg by mouth 2 (two) times daily as needed for pain. 01/15/23  Yes Rana Snare, NP  naproxen sodium (ALEVE) 220 MG tablet Take 220 mg by mouth daily as needed (pain).   Yes [provider]  ondansetron (ZOFRAN) 8 MG tablet Take 1 tablet (8 mg total) by mouth every 8 (eight) hours as needed for nausea or vomiting. Can start taking 72 hours after chemotherapy 02/03/23  Yes Rana Snare, NP  pantoprazole (PROTONIX) 40 MG tablet Take 1 tablet (40 mg total) by mouth daily. 01/27/23  Yes Pokhrel, Laxman, MD  potassium chloride (MICRO-K) 10 MEQ CR capsule Take 2 capsules (20 mEq total) by mouth 2 (two) times daily. 11/24/22  Yes Rodolph Bong, MD  prochlorperazine (COMPAZINE) 10 MG tablet Take 1 tablet (10 mg total) by mouth every 6 (six) hours as needed for nausea or vomiting. 02/03/23  Yes  Rana Snare, NP  doxycycline (VIBRA-TABS) 100 MG tablet Take 1 tablet (100 mg total) by mouth 2 (two) times daily. Patient not taking: Reported on 02/24/2023 02/17/23   Ladene Artist, MD  magic mouthwash (nystatin, diphenhydrAMINE, alum & mag hydroxide) suspension mixture Swish and Spit 5 mLs by mouth 4 (four) times daily as needed. Patient not taking: Reported on 02/17/2023 02/03/23   Rana Snare, NP    Current Facility-Administered Medications  Medication Dose Route Frequency Provider Last Rate Last Admin   0.9 %  sodium chloride infusion   Intravenous Continuous Renne Crigler, PA-C 125 mL/hr at 02/25/23 0825 Infusion Verify at 02/25/23 0825   alum & mag hydroxide-simeth (MAALOX/MYLANTA) 200-200-20 MG/5ML suspension 30 mL  30 mL Oral Q4H Lama, Gagan S, MD       amLODipine (NORVASC) tablet 10 mg  10 mg Oral Daily Rouson, Dietrich Pates, NP       Chlorhexidine Gluconate Cloth 2 % PADS 6 each  6 each Topical Daily Rometta Emery, MD   6 each at 02/25/23 1055   dextrose 5% in lactated ringers with KCl 20 mEq/L infusion   Intravenous Continuous Rometta Emery, MD 125 mL/hr at 02/25/23 0825 Infusion Verify at 02/25/23 0825   enoxaparin (LOVENOX) injection 40 mg  40 mg Subcutaneous QHS Earlie Lou L, MD   40 mg at 02/24/23 2352   famotidine (PEPCID) tablet 20 mg  20 mg Oral Daily Meredeth Ide, MD       feeding supplement (BOOST / RESOURCE BREEZE) liquid 1 Container  1 Container Oral TID BM Luiz Iron, NP       metoprolol tartrate (LOPRESSOR) tablet 50 mg  50 mg Oral BID Rouson, Dietrich Pates, NP       ondansetron (ZOFRAN) tablet 4 mg  4 mg Oral Q6H PRN Rometta Emery, MD       Or   ondansetron (ZOFRAN) injection 4 mg  4 mg Intravenous Q6H PRN Earlie Lou L, MD   4 mg at 02/25/23 0728   sodium chloride flush (NS) 0.9 % injection 10-40 mL  10-40 mL Intracatheter Q12H Garba, Mohammad L, MD   10 mL at 02/25/23 1055   sodium chloride flush (NS) 0.9 % injection 10-40 mL  10-40 mL  Intracatheter PRN Rometta Emery, MD  Allergies as of 02/24/2023 - Review Complete 02/24/2023  Allergen Reaction Noted   Lisinopril Swelling and Other (See Comments) 07/21/2019   Irinotecan Other (See Comments) 10/03/2020   Leucovorin calcium Itching 08/04/2022   Oxaliplatin Itching 08/04/2022    Review of Systems:    Constitutional: No weight loss, fever, chills, weakness or fatigue HEENT: Eyes: No change in vision               Ears, Nose, Throat:  No change in hearing or congestion Skin: No rash or itching Cardiovascular: No chest pain, chest pressure or palpitations   Respiratory: No SOB or cough Gastrointestinal: See HPI and otherwise negative Genitourinary: No dysuria or change in urinary frequency Neurological: No headache, dizziness or syncope Musculoskeletal: No new muscle or joint pain Hematologic: No bleeding or bruising Psychiatric: No history of depression or anxiety     Physical Exam:  Vital signs in last 24 hours: Temp:  [98.2 F (36.8 C)-99 F (37.2 C)] 98.6 F (37 C) (02/12 1034) Pulse Rate:  [80-89] 87 (02/12 1034) Resp:  [14-18] 16 (02/12 1034) BP: (126-153)/(92-111) 153/111 (02/12 1034) SpO2:  [97 %-100 %] 99 % (02/12 1034) Weight:  [60.6 kg] 60.6 kg (02/11 2231) Last BM Date : 02/24/23 Last BM recorded by nurses in past 5 days No data recorded  General:   Pleasant, well developed female in no acute distress Head:  Normocephalic and atraumatic. Eyes: sclerae anicteric,conjunctive pink  Heart:  regular rate and rhythm, no murmurs or gallops Pulm: Clear anteriorly; no wheezing Abdomen:  Soft, Non-distended AB, Hypoactive bowel sounds. moderate tenderness in the epigastrium. Without guarding and Without rebound, No organomegaly appreciated. Extremities:  Without edema. Msk:  Symmetrical without gross deformities. Peripheral pulses intact.  Neurologic:  Alert and  oriented x4;  No focal deficits.  Skin:   Dry and intact without significant  lesions or rashes. Psychiatric:  Cooperative. Normal mood and affect.  LAB RESULTS: Recent Labs    02/24/23 1235 02/25/23 0029 02/25/23 0555  WBC 8.9 8.6 8.1  HGB 10.8* 10.4* 9.4*  HCT 32.3* 32.0* 29.2*  PLT 321 308 250   BMET Recent Labs    02/24/23 1235 02/25/23 0029 02/25/23 0555  NA 134*  --  129*  K 2.8*  --  2.8*  CL 93*  --  93*  CO2 34*  --  29  GLUCOSE 89  --  115*  BUN 5*  --  <5*  CREATININE 0.38* 0.45 0.39*  CALCIUM 9.5  --  8.1*   LFT Recent Labs    02/25/23 0555  PROT 6.4*  ALBUMIN 2.8*  AST 29  ALT 22  ALKPHOS 370*  BILITOT 0.5   PT/INR No results for input(s): "LABPROT", "INR" in the last 72 hours.  STUDIES: CT ABDOMEN PELVIS W CONTRAST Result Date: 02/24/2023 CLINICAL DATA:  Abdominal pain EXAM: CT ABDOMEN AND PELVIS WITH CONTRAST TECHNIQUE: Multidetector CT imaging of the abdomen and pelvis was performed using the standard protocol following bolus administration of intravenous contrast. RADIATION DOSE REDUCTION: This exam was performed according to the departmental dose-optimization program which includes automated exposure control, adjustment of the mA and/or kV according to patient size and/or use of iterative reconstruction technique. CONTRAST:  75mL OMNIPAQUE IOHEXOL 300 MG/ML  SOLN COMPARISON:  01/24/2023 FINDINGS: Lower chest: No acute abnormality. Hepatobiliary: Redemonstration of subtly hypoattenuating lesion within the peripheral aspect of hepatic segment 2 (series 2, image 20). Additional scattered subcentimeter low-density lesions within the liver remain too small to characterize. Prominent  intrahepatic biliary dilatation with biliary stents in place. Slightly hyperattenuating material fills the gallbladder. No pericholecystic inflammatory changes by CT. Pancreas: Stable mild pancreatic ductal dilatation. No inflammatory changes. Spleen: Normal in size without focal abnormality. Adrenals/Urinary Tract: Unremarkable adrenal glands. Stable  bilateral nephrolithiasis with stones measuring up to 1.1 cm on the left and 0.9 cm on the right. No hydronephrosis. Urinary bladder within normal limits for the degree of distension. Stomach/Bowel: Stomach is within normal limits. Appendix appears normal. Similar degree of wall thickening of the rectosigmoid colon. No new sites of bowel wall thickening or inflammation. Vascular/Lymphatic: Scattered aortoiliac atherosclerotic calcifications without aneurysm. No abdominopelvic lymphadenopathy. Reproductive: Uterus and bilateral adnexa are unremarkable. Other: Similar degree of nodularity along the omentum and left pericolic gutter. No ascites or pneumoperitoneum. Musculoskeletal: No lytic or sclerotic bone lesion. Grade 2 anterolisthesis of L5 on S1 secondary to chronic bilateral L5 pars interarticularis defects. IMPRESSION: 1. No new or acute abdominopelvic findings. 2. 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 3. Prominent intrahepatic biliary dilatation with biliary stents in place. 4. Stable bilateral nephrolithiasis. 5. Aortic atherosclerosis (ICD10-I70.0). Electronically Signed   By: Duanne Guess D.O.   On: 02/24/2023 18:49      Impression    48 year old female with history of metastatic colon cancer complicated by bili obstruction status post ERCP with stenting in March of this year presents for worsening abdominal pain. Epigastric abdominal pain associated with some belching, nausea/vomiting possible regurgitation CT shows well-placed biliary stent with prominent intra hepatic biliary dilation similar findings known metastasis of rectal cancer with hepatic and omental/peritoneal  Liver function is stable with no elevation of bilirubin or alk phos.   Involvement, does not show any masses compressing duodenum. Patient has been taking Aleve as well as dexamethasone and has not been on her PPI, has had some melena has no  elevated BUN and stable CBC. From patient's symptoms most suspicious for gastritis, esophagitis, duodenitis, peptic ulcer disease, rule out progression of cancer, musculoskeletal, possible constipation  - Protonix 40 mg IV BID. -Clear liquid diet, NPO at midnight. -Monitor CBC, liver function, kidney function -Avoid NSAIDs -Need to replace potassium prior to endoscopic evaluation -I discussed with Endo and we will plan on doing EGD with planned ERCP with Dr. Meridee Score tomorrow. - I thoroughly discussed the procedure to include nature, alternatives, benefits, and risks including but not limited to bleeding, perforation, infection, anesthesia/cardiac and pulmonary complications.  -Can do lidocaine patches -Patient has decreased bowel sounds and worsening constipation over the last week with hydrocodone use, will start on MiraLAX twice daily, Senokot as needed -Continue follow-up with oncology -Further recommendations per Dr. Adela Lank and Dr. Meridee Score     Principal Problem:   Abdominal pain Active Problems:   Hypertension   Hypokalemia   Rectal cancer (HCC)   Metastatic colon cancer to liver (HCC)   Cancer related pain    LOS: 0 days   Thank you for your kind consultation, we will continue to follow.   Doree Albee  02/25/2023, 12:44 PM

## 2023-02-25 NOTE — H&P (View-Only) (Signed)
Consultation  Referring Provider:   [facility] Dr. Sharl Ma Primary Care Physician:  Early, Sung Amabile, NP Primary Gastroenterologist:  Dr. Meridee Score       Reason for Consultation:     AB pain DOA: 02/24/2023         Hospital Day: 2         HPI:   April [name] is a 48 y.o. female with past medical history significant for April [name] is a 48 y.o. female with a pmh significant for hypertension, nephrolithiasis, metastatic colon cancer (complicated by biliary obstruction status post ERCP with stenting), chronic pain, constipation.   Presents to the ER with persistent severe epigastric abdominal pain. Work up notable for Sodium 134, potassium 2.8, BUN 5, creatinine 0.38 Alk phos 422, lipase 20, AST 23, ALT 20, total bilirubin 0.5 WBC 8.9, Hgb 10.8, MCV 90, platelets 321 Negative influenza, unremarkable urine CT abdomen pelvis 2/11 IMPRESSION: 1. No new or acute abdominopelvic findings. 2. 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 3. Prominent intrahepatic biliary dilatation with biliary stents in place. 4. Stable bilateral nephrolithiasis. 5. Aortic atherosclerosis (ICD10-I70.0).  Patient follows with Dr. Truett Perna for metastatic rectal carcinoma has completed 4 cycles of  irinotecan/Panitumumab, last cycle being this past Tuesday.  Pain has been going on for months but progressively worse, normally pain improves after several cycles of chemotherapy but this did not make the pain better.  Pain is primarily at the top of her stomach feels that when she eats or drinks that the food or drink is "going through final" she has a lot of burping belching has had some episodes of vomiting which is normally just bubbled mucus but she has had 1 episode of undigested fish.  Denies any odynophagia or true dysphagia while eating or drinking just feels that food or drink can to stop at the top of her stomach.   States the epigastric discomfort is more constant but can be worse with a deep breath, palpation, after food. She denies GERD but has had a lot of burping.  She was on pantoprazole she states she thinks twice daily but has not taken in the last week.  She is on Aleve daily for pain as well as dexamethasone for a week around her chemo cycle. She does complain of dark and black thick hard stools 3 times daily for the last week.  She is on hydrocodone and has history of constipation.  With the pain she is sweating and nausea.  She does have cold sweats and chills at night.  Can have sweating through her shirt at night but denies objective fever.  Patient does have dark urine for the past week as well.  Patient denies palpitations, chest pain no swelling in her legs.  Abnormal ED labs: Abnormal Labs Reviewed  URINALYSIS, ROUTINE W REFLEX MICROSCOPIC - Abnormal; Notable for the following components:      Result Value   APPearance CLOUDY (*)    Specific Gravity, Urine 1.034 (*)    Hgb urine dipstick SMALL (*)    All other components within normal limits  CBC - Abnormal; Notable for the following components:   RBC 3.47 (*)    Hemoglobin 10.4 (*)    HCT 32.0 (*)    RDW 15.6 (*)    All other components within normal limits  COMPREHENSIVE METABOLIC PANEL - Abnormal; Notable for the following components:   Sodium 129 (*)    Potassium  2.8 (*)    Chloride 93 (*)    Glucose, Bld 115 (*)    BUN <5 (*)    Creatinine, Ser 0.39 (*)    Calcium 8.1 (*)    Total Protein 6.4 (*)    Albumin 2.8 (*)    Alkaline Phosphatase 370 (*)    All other components within normal limits  CBC - Abnormal; Notable for the following components:   RBC 3.14 (*)    Hemoglobin 9.4 (*)    HCT 29.2 (*)    RDW 15.6 (*)    All other components within normal limits    Past Medical History:  Diagnosis Date   Ascending aortic aneurysm (HCC) 12/16/2022   Benign essential hypertension 10/06/2017   Last Assessment & Plan:   Formatting of this note might be different from the original. Patient's blood pressure noted to be elevated at today's visit 188/98.  Patient will be provided with antihypertensives in the treatment center if needed in order to meet her Avastin parameters.  Patient recommended to follow up with her primary care physician regarding medication management.  Patient is not cur   Cancer [facility])    Change in bowel habits 06/28/2022   Colon cancer (HCC)    History of colon cancer 06/28/2022   Hypertension    Prolonged QT interval 01/07/2018   Renal calculi    bilateral   Sepsis due to undetermined organism (HCC) 12/24/2022   Sialoadenitis of submandibular gland 12/16/2022    Surgical History:  She  has a past surgical history that includes Cesarean section; Ureteral stent placement; IR REMOVAL TUN ACCESS W/ PORT W/O FL MOD SED (09/14/2019); IR Radiologist Eval & Mgmt (09/16/2019); IR Radiologist Eval & Mgmt (09/23/2019); IR Radiologist Eval & Mgmt (09/20/2019); IR Radiologist Eval & Mgmt (09/30/2019); Cystoscopy with retrograde pyelogram, ureteroscopy and stent placement (Right, 10/22/2020); ERCP (N/A, 03/24/2022); sphincterotomy (03/24/2022); pancreatic stent placement (03/24/2022); Biliary brushing (03/24/2022); removal of stones (03/24/2022); Biliary dilation (03/24/2022); biliary stent placement (03/24/2022); biopsy (03/24/2022); IR Veno/Ext/Uni Right (04/11/2022); Colonoscopy (N/A, 11/23/2022); biopsy (11/23/2022); and IR PATIENT EVAL TECH 0-60 MINS (12/15/2022). Family History:  Her family history includes Diabetes Mellitus II in her father; Hypertension in her mother; Stroke in her father; Ulcers in her sister. Social History:   reports that she has quit smoking. Her smoking use included cigarettes. She has never used smokeless tobacco. She reports that she does not currently use alcohol. She reports that she does not currently use drugs after having used the following drugs: Marijuana.  Prior to Admission  medications   Medication Sig Start Date End Date Taking? Authorizing Provider  amLODipine (NORVASC) 10 MG tablet Take 1 tablet (10 mg total) by mouth daily. 11/24/22  Yes Rodolph Bong, MD  dexamethasone (DECADRON) 4 MG tablet Take 1 tablet by mouth twice a day for 3 days beginning day 2 of chemo. 02/03/23  Yes Rana Snare, NP  dicyclomine (BENTYL) 20 MG tablet Take 1 tablet (20 mg total) by mouth 3 (three) times daily as needed for spasms (rectal/AB pain). 02/17/23  Yes Ladene Artist, MD  HYDROcodone-acetaminophen (NORCO) 10-325 MG tablet Take 1 tablet by mouth every 4 (four) hours as needed for moderate pain (pain score 4-6) or severe pain (pain score 7-10). 02/16/23  Yes Ladene Artist, MD  lactulose (CHRONULAC) 10 GM/15ML solution Take 15-30 mLs by mouth daily as needed for mild constipation.   Yes [provider]  metoprolol tartrate (LOPRESSOR) 50 MG tablet Take 1 tablet (50  mg total) by mouth 2 (two) times daily. 11/24/22  Yes Rodolph Bong, MD  morphine (MS CONTIN) 30 MG 12 hr tablet Take 1 tablet (30 mg total) by mouth every 12 (twelve) hours. Patient taking differently: Take 30 mg by mouth 2 (two) times daily as needed for pain. 01/15/23  Yes Rana Snare, NP  naproxen sodium (ALEVE) 220 MG tablet Take 220 mg by mouth daily as needed (pain).   Yes [provider]  ondansetron (ZOFRAN) 8 MG tablet Take 1 tablet (8 mg total) by mouth every 8 (eight) hours as needed for nausea or vomiting. Can start taking 72 hours after chemotherapy 02/03/23  Yes Rana Snare, NP  pantoprazole (PROTONIX) 40 MG tablet Take 1 tablet (40 mg total) by mouth daily. 01/27/23  Yes Pokhrel, Laxman, MD  potassium chloride (MICRO-K) 10 MEQ CR capsule Take 2 capsules (20 mEq total) by mouth 2 (two) times daily. 11/24/22  Yes Rodolph Bong, MD  prochlorperazine (COMPAZINE) 10 MG tablet Take 1 tablet (10 mg total) by mouth every 6 (six) hours as needed for nausea or vomiting. 02/03/23  Yes  Rana Snare, NP  doxycycline (VIBRA-TABS) 100 MG tablet Take 1 tablet (100 mg total) by mouth 2 (two) times daily. Patient not taking: Reported on 02/24/2023 02/17/23   Ladene Artist, MD  magic mouthwash (nystatin, diphenhydrAMINE, alum & mag hydroxide) suspension mixture Swish and Spit 5 mLs by mouth 4 (four) times daily as needed. Patient not taking: Reported on 02/17/2023 02/03/23   Rana Snare, NP    Current Facility-Administered Medications  Medication Dose Route Frequency Provider Last Rate Last Admin   0.9 %  sodium chloride infusion   Intravenous Continuous Renne Crigler, PA-C 125 mL/hr at 02/25/23 0825 Infusion Verify at 02/25/23 0825   alum & mag hydroxide-simeth (MAALOX/MYLANTA) 200-200-20 MG/5ML suspension 30 mL  30 mL Oral Q4H Lama, Gagan S, MD       amLODipine (NORVASC) tablet 10 mg  10 mg Oral Daily Rouson, Dietrich Pates, NP       Chlorhexidine Gluconate Cloth 2 % PADS 6 each  6 each Topical Daily Rometta Emery, MD   6 each at 02/25/23 1055   dextrose 5% in lactated ringers with KCl 20 mEq/L infusion   Intravenous Continuous Rometta Emery, MD 125 mL/hr at 02/25/23 0825 Infusion Verify at 02/25/23 0825   enoxaparin (LOVENOX) injection 40 mg  40 mg Subcutaneous QHS Earlie Lou L, MD   40 mg at 02/24/23 2352   famotidine (PEPCID) tablet 20 mg  20 mg Oral Daily Meredeth Ide, MD       feeding supplement (BOOST / RESOURCE BREEZE) liquid 1 Container  1 Container Oral TID BM Luiz Iron, NP       metoprolol tartrate (LOPRESSOR) tablet 50 mg  50 mg Oral BID Rouson, Dietrich Pates, NP       ondansetron (ZOFRAN) tablet 4 mg  4 mg Oral Q6H PRN Rometta Emery, MD       Or   ondansetron (ZOFRAN) injection 4 mg  4 mg Intravenous Q6H PRN Earlie Lou L, MD   4 mg at 02/25/23 0728   sodium chloride flush (NS) 0.9 % injection 10-40 mL  10-40 mL Intracatheter Q12H Garba, Mohammad L, MD   10 mL at 02/25/23 1055   sodium chloride flush (NS) 0.9 % injection 10-40 mL  10-40 mL  Intracatheter PRN Rometta Emery, MD  Allergies as of 02/24/2023 - Review Complete 02/24/2023  Allergen Reaction Noted   Lisinopril Swelling and Other (See Comments) 07/21/2019   Irinotecan Other (See Comments) 10/03/2020   Leucovorin calcium Itching 08/04/2022   Oxaliplatin Itching 08/04/2022    Review of Systems:    Constitutional: No weight loss, fever, chills, weakness or fatigue HEENT: Eyes: No change in vision               Ears, Nose, Throat:  No change in hearing or congestion Skin: No rash or itching Cardiovascular: No chest pain, chest pressure or palpitations   Respiratory: No SOB or cough Gastrointestinal: See HPI and otherwise negative Genitourinary: No dysuria or change in urinary frequency Neurological: No headache, dizziness or syncope Musculoskeletal: No new muscle or joint pain Hematologic: No bleeding or bruising Psychiatric: No history of depression or anxiety     Physical Exam:  Vital signs in last 24 hours: Temp:  [98.2 F (36.8 C)-99 F (37.2 C)] 98.6 F (37 C) (02/12 1034) Pulse Rate:  [80-89] 87 (02/12 1034) Resp:  [14-18] 16 (02/12 1034) BP: (126-153)/(92-111) 153/111 (02/12 1034) SpO2:  [97 %-100 %] 99 % (02/12 1034) Weight:  [60.6 kg] 60.6 kg (02/11 2231) Last BM Date : 02/24/23 Last BM recorded by nurses in past 5 days No data recorded  General:   Pleasant, well developed female in no acute distress Head:  Normocephalic and atraumatic. Eyes: sclerae anicteric,conjunctive pink  Heart:  regular rate and rhythm, no murmurs or gallops Pulm: Clear anteriorly; no wheezing Abdomen:  Soft, Non-distended AB, Hypoactive bowel sounds. moderate tenderness in the epigastrium. Without guarding and Without rebound, No organomegaly appreciated. Extremities:  Without edema. Msk:  Symmetrical without gross deformities. Peripheral pulses intact.  Neurologic:  Alert and  oriented x4;  No focal deficits.  Skin:   Dry and intact without significant  lesions or rashes. Psychiatric:  Cooperative. Normal mood and affect.  LAB RESULTS: Recent Labs    02/24/23 1235 02/25/23 0029 02/25/23 0555  WBC 8.9 8.6 8.1  HGB 10.8* 10.4* 9.4*  HCT 32.3* 32.0* 29.2*  PLT 321 308 250   BMET Recent Labs    02/24/23 1235 02/25/23 0029 02/25/23 0555  NA 134*  --  129*  K 2.8*  --  2.8*  CL 93*  --  93*  CO2 34*  --  29  GLUCOSE 89  --  115*  BUN 5*  --  <5*  CREATININE 0.38* 0.45 0.39*  CALCIUM 9.5  --  8.1*   LFT Recent Labs    02/25/23 0555  PROT 6.4*  ALBUMIN 2.8*  AST 29  ALT 22  ALKPHOS 370*  BILITOT 0.5   PT/INR No results for input(s): "LABPROT", "INR" in the last 72 hours.  STUDIES: CT ABDOMEN PELVIS W CONTRAST Result Date: 02/24/2023 CLINICAL DATA:  Abdominal pain EXAM: CT ABDOMEN AND PELVIS WITH CONTRAST TECHNIQUE: Multidetector CT imaging of the abdomen and pelvis was performed using the standard protocol following bolus administration of intravenous contrast. RADIATION DOSE REDUCTION: This exam was performed according to the departmental dose-optimization program which includes automated exposure control, adjustment of the mA and/or kV according to patient size and/or use of iterative reconstruction technique. CONTRAST:  75mL OMNIPAQUE IOHEXOL 300 MG/ML  SOLN COMPARISON:  01/24/2023 FINDINGS: Lower chest: No acute abnormality. Hepatobiliary: Redemonstration of subtly hypoattenuating lesion within the peripheral aspect of hepatic segment 2 (series 2, image 20). Additional scattered subcentimeter low-density lesions within the liver remain too small to characterize. Prominent  intrahepatic biliary dilatation with biliary stents in place. Slightly hyperattenuating material fills the gallbladder. No pericholecystic inflammatory changes by CT. Pancreas: Stable mild pancreatic ductal dilatation. No inflammatory changes. Spleen: Normal in size without focal abnormality. Adrenals/Urinary Tract: Unremarkable adrenal glands. Stable  bilateral nephrolithiasis with stones measuring up to 1.1 cm on the left and 0.9 cm on the right. No hydronephrosis. Urinary bladder within normal limits for the degree of distension. Stomach/Bowel: Stomach is within normal limits. Appendix appears normal. Similar degree of wall thickening of the rectosigmoid colon. No new sites of bowel wall thickening or inflammation. Vascular/Lymphatic: Scattered aortoiliac atherosclerotic calcifications without aneurysm. No abdominopelvic lymphadenopathy. Reproductive: Uterus and bilateral adnexa are unremarkable. Other: Similar degree of nodularity along the omentum and left pericolic gutter. No ascites or pneumoperitoneum. Musculoskeletal: No lytic or sclerotic bone lesion. Grade 2 anterolisthesis of L5 on S1 secondary to chronic bilateral L5 pars interarticularis defects. IMPRESSION: 1. No new or acute abdominopelvic findings. 2. 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 3. Prominent intrahepatic biliary dilatation with biliary stents in place. 4. Stable bilateral nephrolithiasis. 5. Aortic atherosclerosis (ICD10-I70.0). Electronically Signed   By: Duanne Guess D.O.   On: 02/24/2023 18:49      Impression    48 year old female with history of metastatic colon cancer complicated by bili obstruction status post ERCP with stenting in March of this year presents for worsening abdominal pain. Epigastric abdominal pain associated with some belching, nausea/vomiting possible regurgitation CT shows well-placed biliary stent with prominent intra hepatic biliary dilation similar findings known metastasis of rectal cancer with hepatic and omental/peritoneal  Liver function is stable with no elevation of bilirubin or alk phos.   Involvement, does not show any masses compressing duodenum. Patient has been taking Aleve as well as dexamethasone and has not been on her PPI, has had some melena has no  elevated BUN and stable CBC. From patient's symptoms most suspicious for gastritis, esophagitis, duodenitis, peptic ulcer disease, rule out progression of cancer, musculoskeletal, possible constipation  - Protonix 40 mg IV BID. -Clear liquid diet, NPO at midnight. -Monitor CBC, liver function, kidney function -Avoid NSAIDs -Need to replace potassium prior to endoscopic evaluation -I discussed with Endo and we will plan on doing EGD with planned ERCP with Dr. Meridee Score tomorrow. - I thoroughly discussed the procedure to include nature, alternatives, benefits, and risks including but not limited to bleeding, perforation, infection, anesthesia/cardiac and pulmonary complications.  -Can do lidocaine patches -Patient has decreased bowel sounds and worsening constipation over the last week with hydrocodone use, will start on MiraLAX twice daily, Senokot as needed -Continue follow-up with oncology -Further recommendations per Dr. Adela Lank and Dr. Meridee Score     Principal Problem:   Abdominal pain Active Problems:   Hypertension   Hypokalemia   Rectal cancer (HCC)   Metastatic colon cancer to liver (HCC)   Cancer related pain    LOS: 0 days   Thank you for your kind consultation, we will continue to follow.   Doree Albee  02/25/2023, 12:44 PM

## 2023-02-25 NOTE — Plan of Care (Signed)
Problem: Education: Goal: Knowledge of General Education information will improve Description Including pain rating scale, medication(s)/side effects and non-pharmacologic comfort measures Outcome: Progressing   Problem: Health Behavior/Discharge Planning: Goal: Ability to manage health-related needs will improve Outcome: Progressing

## 2023-02-25 NOTE — Progress Notes (Signed)
[facility]  PROGRESS NOTE  April [name] ZOX:[id number] DOB: 14-Mar-1975 DOA: 02/24/2023 PCP: Tollie Eth, NP   Brief HPI:     48 y.o. female with medical history significant of metastatic rectal carcinoma, essential hypertension, renal calculi ascending aortic aneurysm biliary dilatation, who presents to the [facility] with severe abdominal pain pain is persistent in nature 10 out of 10 at epigastrium to right upper quadrant.  Associated with some nausea vomiting.  CT abdomen pelvis showed no new abdominal pelvic findings. There is similar findings compatible with known metastatic rectal cancer with hepatic and omental or peritoneal involvement. There is prominent intrahepatic biliary dilatation also. This could be the cause of her pain.    Assessment/Plan:   Abdominal pain -Patient has epigastric pain, unclear etiology -CT abdomen/pelvis was unremarkable -Lipase is 20 -Oncology does not think that abdominal pain is due to liver metastasis -Patient has a pancreatic stent in place -She was supposed to get ERCP on 02/26/2023 as outpatient -Will consult GI for possible EGD/ERCP in a.m.  Hypokalemia -Potassium is 2.8 this morning -Will replace potassium and recheck potassium tonight at 9 PM  Hypomagnesemia  -Magnesium is 1.6, will replace magnesium with 2 g of magnesium sulfate IV x 1  Hypertension -Blood pressure is mildly elevated Continue metoprolol, amlodipine  Metastatic rectal cancer -Followed by oncology, Dr. Truett Perna -Is following her in the hospital    Medications     Chlorhexidine Gluconate Cloth  6 each Topical Daily   enoxaparin (LOVENOX) injection  40 mg Subcutaneous QHS   feeding supplement  1 Container Oral TID BM   sodium chloride flush  10-40 mL Intracatheter Q12H     Data Reviewed:   CBG:  No results for input(s): "GLUCAP" in the last 168 hours.  SpO2: 99 %    Vitals:   02/25/23 0057 02/25/23 0201 02/25/23  0648 02/25/23 1034  BP:  (!) 132/98 (!) 139/98 (!) 153/111  Pulse:  89 88 87  Resp:  14 14 16   Temp:  98.8 F (37.1 C) 99 F (37.2 C) 98.6 F (37 C)  TempSrc:  Oral Oral Oral  SpO2:  98% 100% 99%  Weight:      Height: 5\' 4"  (1.626 m)         Data Reviewed:  Basic Metabolic Panel: Recent Labs  Lab 02/24/23 1235 02/25/23 0029 02/25/23 0555  NA 134*  --  129*  K 2.8*  --  2.8*  CL 93*  --  93*  CO2 34*  --  29  GLUCOSE 89  --  115*  BUN 5*  --  <5*  CREATININE 0.38* 0.45 0.39*  CALCIUM 9.5  --  8.1*  MG 1.6*  --   --     CBC: Recent Labs  Lab 02/24/23 1235 02/25/23 0029 02/25/23 0555  WBC 8.9 8.6 8.1  NEUTROABS 5.4  --   --   HGB 10.8* 10.4* 9.4*  HCT 32.3* 32.0* 29.2*  MCV 90.2 92.2 93.0  PLT 321 308 250    LFT Recent Labs  Lab 02/24/23 1235 02/25/23 0555  AST 23 29  ALT 20 22  ALKPHOS 422* 370*  BILITOT 0.5 0.5  PROT 7.3 6.4*  ALBUMIN 3.5 2.8*     Antibiotics: Anti-infectives (From admission, onward)    None        DVT prophylaxis: Lovenox  Code Status: Full code  Family Communication: No family at bedside   CONSULTS gastroenterology   Subjective   Complains  of abdominal pain   Objective    Physical Examination:   General-appears in no acute distress Heart-S1-S2, regular, no murmur auscultated Lungs-clear to auscultation bilaterally, no wheezing or crackles auscultated Abdomen-soft, nontender, no organomegaly Extremities-no edema in the lower extremities Neuro-alert, oriented x3, no focal deficit noted   Status is: Inpatient:             Meredeth Ide   [facility] If 7PM-7AM, please contact night-coverage at [email or web address], Office  [phone]   02/25/2023, 11:46 AM  LOS: 0 days

## 2023-02-26 ENCOUNTER — Encounter (HOSPITAL_COMMUNITY)
Admission: EM | Disposition: A | Discharge status: Home / Self Care | Payer: Self-pay | Source: Home / Self Care | Attending: Family Medicine

## 2023-02-26 ENCOUNTER — Inpatient Hospital Stay (HOSPITAL_COMMUNITY): Payer: 59

## 2023-02-26 ENCOUNTER — Telehealth: Payer: Self-pay

## 2023-02-26 ENCOUNTER — Inpatient Hospital Stay (HOSPITAL_COMMUNITY): Payer: 59 | Admitting: Anesthesiology

## 2023-02-26 ENCOUNTER — Ambulatory Visit (HOSPITAL_COMMUNITY): Admission: RE | Admit: 2023-02-26 | Payer: 59 | Source: Home / Self Care | Admitting: Gastroenterology

## 2023-02-26 ENCOUNTER — Encounter (HOSPITAL_COMMUNITY): Payer: Self-pay | Admitting: Family Medicine

## 2023-02-26 DIAGNOSIS — I10 Essential (primary) hypertension: Secondary | ICD-10-CM | POA: Diagnosis not present

## 2023-02-26 DIAGNOSIS — K3189 Other diseases of stomach and duodenum: Secondary | ICD-10-CM

## 2023-02-26 DIAGNOSIS — K2289 Other specified disease of esophagus: Secondary | ICD-10-CM | POA: Diagnosis not present

## 2023-02-26 DIAGNOSIS — Z87891 Personal history of nicotine dependence: Secondary | ICD-10-CM | POA: Diagnosis not present

## 2023-02-26 DIAGNOSIS — K2950 Unspecified chronic gastritis without bleeding: Secondary | ICD-10-CM | POA: Diagnosis not present

## 2023-02-26 DIAGNOSIS — K831 Obstruction of bile duct: Secondary | ICD-10-CM

## 2023-02-26 DIAGNOSIS — Z9889 Other specified postprocedural states: Secondary | ICD-10-CM | POA: Diagnosis not present

## 2023-02-26 DIAGNOSIS — C240 Malignant neoplasm of extrahepatic bile duct: Secondary | ICD-10-CM | POA: Diagnosis not present

## 2023-02-26 DIAGNOSIS — R1010 Upper abdominal pain, unspecified: Secondary | ICD-10-CM | POA: Diagnosis not present

## 2023-02-26 DIAGNOSIS — R112 Nausea with vomiting, unspecified: Secondary | ICD-10-CM

## 2023-02-26 HISTORY — PX: BIOPSY: SHX5522

## 2023-02-26 HISTORY — PX: STENT REMOVAL: SHX6421

## 2023-02-26 HISTORY — PX: ESOPHAGOGASTRODUODENOSCOPY (EGD) WITH PROPOFOL: SHX5813

## 2023-02-26 HISTORY — PX: BALLOON DILATION: SHX5330

## 2023-02-26 HISTORY — PX: REMOVAL OF STONES: SHX5545

## 2023-02-26 HISTORY — PX: BILIARY STENT PLACEMENT: SHX5538

## 2023-02-26 HISTORY — PX: ENDOSCOPIC RETROGRADE CHOLANGIOPANCREATOGRAPHY (ERCP) WITH PROPOFOL: SHX5810

## 2023-02-26 SURGERY — ESOPHAGOGASTRODUODENOSCOPY (EGD) WITH PROPOFOL
Anesthesia: General

## 2023-02-26 MED ORDER — ONDANSETRON HCL 4 MG/2ML IJ SOLN
INTRAMUSCULAR | Status: AC
  Filled 2023-02-26: qty 2

## 2023-02-26 MED ORDER — CIPROFLOXACIN IN D5W 400 MG/200ML IV SOLN
INTRAVENOUS | Status: AC
  Filled 2023-02-26: qty 200

## 2023-02-26 MED ORDER — SODIUM CHLORIDE 0.9 % IV SOLN: INTRAVENOUS | Status: DC

## 2023-02-26 MED ORDER — SODIUM CHLORIDE 0.9 % IV SOLN
INTRAVENOUS | Status: DC | PRN
  Administered 2023-02-26: 45 mL

## 2023-02-26 MED ORDER — AMISULPRIDE (ANTIEMETIC) 5 MG/2ML IV SOLN
10.0000 mg | Freq: Once | INTRAVENOUS | Status: AC
  Administered 2023-02-26: 10 mg via INTRAVENOUS

## 2023-02-26 MED ORDER — SUGAMMADEX SODIUM 200 MG/2ML IV SOLN
INTRAVENOUS | Status: DC | PRN
  Administered 2023-02-26: 200 mg via INTRAVENOUS

## 2023-02-26 MED ORDER — PROPOFOL 10 MG/ML IV BOLUS
INTRAVENOUS | Status: AC
  Filled 2023-02-26: qty 20

## 2023-02-26 MED ORDER — ENOXAPARIN SODIUM 40 MG/0.4ML IJ SOSY
40.0000 mg | PREFILLED_SYRINGE | Freq: Every day | INTRAMUSCULAR | Status: DC
Start: 2023-02-27 — End: 2023-02-27

## 2023-02-26 MED ORDER — ROCURONIUM BROMIDE 100 MG/10ML IV SOLN
INTRAVENOUS | Status: DC | PRN
  Administered 2023-02-26: 40 mg via INTRAVENOUS
  Administered 2023-02-26: 20 mg via INTRAVENOUS

## 2023-02-26 MED ORDER — HYDROMORPHONE HCL 2 MG PO TABS
2.0000 mg | ORAL_TABLET | ORAL | Status: DC | PRN
  Administered 2023-02-26 – 2023-02-27 (×3): 2 mg via ORAL
  Filled 2023-02-26 (×3): qty 1

## 2023-02-26 MED ORDER — DICLOFENAC SUPPOSITORY 100 MG
RECTAL | Status: DC | PRN
  Administered 2023-02-26: 100 mg via RECTAL

## 2023-02-26 MED ORDER — AMISULPRIDE (ANTIEMETIC) 5 MG/2ML IV SOLN
INTRAVENOUS | Status: AC
  Filled 2023-02-26: qty 2

## 2023-02-26 MED ORDER — FENTANYL CITRATE (PF) 100 MCG/2ML IJ SOLN
INTRAMUSCULAR | Status: AC
  Filled 2023-02-26: qty 2

## 2023-02-26 MED ORDER — DEXAMETHASONE SODIUM PHOSPHATE 4 MG/ML IJ SOLN
INTRAMUSCULAR | Status: DC | PRN
  Administered 2023-02-26: 4 mg via INTRAVENOUS

## 2023-02-26 MED ORDER — LIDOCAINE HCL (CARDIAC) PF 100 MG/5ML IV SOSY
PREFILLED_SYRINGE | INTRAVENOUS | Status: DC | PRN
  Administered 2023-02-26: 50 mg via INTRAVENOUS

## 2023-02-26 MED ORDER — GLUCAGON HCL RDNA (DIAGNOSTIC) 1 MG IJ SOLR
INTRAMUSCULAR | Status: AC
  Filled 2023-02-26: qty 1

## 2023-02-26 MED ORDER — FENTANYL CITRATE (PF) 100 MCG/2ML IJ SOLN
INTRAMUSCULAR | Status: DC | PRN
  Administered 2023-02-26 (×2): 100 ug via INTRAVENOUS

## 2023-02-26 MED ORDER — GLUCAGON HCL RDNA (DIAGNOSTIC) 1 MG IJ SOLR
INTRAMUSCULAR | Status: DC | PRN
Start: 2023-02-26 — End: 2023-02-26
  Administered 2023-02-26 (×4): .25 mg via INTRAVENOUS

## 2023-02-26 MED ORDER — PROPOFOL 10 MG/ML IV BOLUS
INTRAVENOUS | Status: DC | PRN
  Administered 2023-02-26: 150 mg via INTRAVENOUS

## 2023-02-26 MED ORDER — DICLOFENAC SUPPOSITORY 100 MG
RECTAL | Status: AC
  Filled 2023-02-26: qty 1

## 2023-02-26 MED ORDER — CIPROFLOXACIN IN D5W 400 MG/200ML IV SOLN
INTRAVENOUS | Status: DC | PRN
  Administered 2023-02-26: 400 mg via INTRAVENOUS

## 2023-02-26 SURGICAL SUPPLY — 14 items

## 2023-02-26 NOTE — Anesthesia Preprocedure Evaluation (Addendum)
Anesthesia Evaluation  Patient identified by MRN, date of birth, ID band Patient awake    Reviewed: Allergy & Precautions, NPO status , Patient's Chart, lab work & pertinent test results  History of Anesthesia Complications Negative for: history of anesthetic complications  Airway Mallampati: I  TM Distance: >3 FB Neck ROM: Full    Dental  (+) Dental Advisory Given   Pulmonary neg shortness of breath, neg sleep apnea, COPD (no current inhaler use), neg recent URI, former smoker   Pulmonary exam normal breath sounds clear to auscultation       Cardiovascular hypertension, Pt. on medications and Pt. on home beta blockers (-) angina (-) Past MI, (-) Cardiac Stents and (-) CABG + dysrhythmias (prolonged QT)  Rhythm:Regular Rate:Normal  Ascending aortic aneurysm  TTE 01/07/2018: Study Conclusions   - Left ventricle: The cavity size was normal. Wall thickness was    increased in a pattern of moderate LVH. Systolic function was    vigorous. The estimated ejection fraction was in the range of 65%    to 70%. Wall motion was normal; there were no regional wall    motion abnormalities. The study is not technically sufficient to    allow evaluation of LV diastolic function.  - Aortic valve: Transvalvular velocity was within the normal range.    There was no stenosis. There was no regurgitation.  - Mitral valve: Transvalvular velocity was within the normal range.    There was no evidence for stenosis. There was no regurgitation.  - Left atrium: The atrium was normal in size.  - Right ventricle: The cavity size was normal. Wall thickness was    normal. Systolic function was normal.  - Right atrium: The atrium was normal in size.  - Atrial septum: No defect or patent foramen ovale was identified.  - Tricuspid valve: There was trivial regurgitation.  - Inferior vena cava: The vessel was normal in size. The    respirophasic diameter changes  were in the normal range (>= 50%),    consistent with normal central venous pressure.  - Pericardium, extracardiac: There was no pericardial effusion.     Neuro/Psych  Headaches, neg Seizures  Neuromuscular disease (neuropathy)    GI/Hepatic ,GERD  Medicated,,Liver metastasis Malignant biliary stricture, colon cancer, malnutrition   Endo/Other  negative endocrine ROS    Renal/GU Renal disease     Musculoskeletal   Abdominal   Peds  Hematology  (+) Blood dyscrasia, anemia Lab Results      Component                Value               Date                      WBC                      8.1                 02/25/2023                HGB                      9.4 (L)             02/25/2023                HCT  29.2 (L)            02/25/2023                MCV                      93.0                02/25/2023                PLT                      250                 02/25/2023              Anesthesia Other Findings K 3.7  Reproductive/Obstetrics                             Anesthesia Physical Anesthesia Plan  ASA: 4  Anesthesia Plan: General   Post-op Pain Management:    Induction: Intravenous  PONV Risk Score and Plan: 3 and Ondansetron, Dexamethasone and Treatment may vary due to age or medical condition  Airway Management Planned: Oral ETT  Additional Equipment:   Intra-op Plan:   Post-operative Plan: Extubation in OR  Informed Consent: I have reviewed the patients History and Physical, chart, labs and discussed the procedure including the risks, benefits and alternatives for the proposed anesthesia with the patient or authorized representative who has indicated his/her understanding and acceptance.     Dental advisory given  Plan Discussed with: CRNA and Anesthesiologist  Anesthesia Plan Comments: (Risks of general anesthesia discussed including, but not limited to, sore throat, hoarse voice, chipped/damaged  teeth, injury to vocal cords, nausea and vomiting, allergic reactions, lung infection, heart attack, stroke, and death. All questions answered. )       Anesthesia Quick Evaluation

## 2023-02-26 NOTE — Progress Notes (Signed)
[facility]  PROGRESS NOTE  April [name] BMW:[id number] DOB: [birth year] DOA: 02/24/2023 PCP: Tollie Eth, NP   Brief HPI:     48 y.o. female with medical history significant of metastatic rectal carcinoma, essential hypertension, renal calculi ascending aortic aneurysm biliary dilatation, who presents to the [facility] with severe abdominal pain pain is persistent in nature 10 out of 10 at epigastrium to right upper quadrant.  Associated with some nausea vomiting.  CT abdomen pelvis showed no new abdominal pelvic findings. There is similar findings compatible with known metastatic rectal cancer with hepatic and omental or peritoneal involvement. There is prominent intrahepatic biliary dilatation also. This could be the cause of her pain.    Assessment/Plan:   Abdominal pain -Patient has epigastric pain, unclear etiology -CT abdomen/pelvis was unremarkable -Lipase is 20 -Oncology does not think that abdominal pain is due to liver metastasis -Gastroenterology was consulted, underwent ERCP with placement of right and left hepatic duct stents -Follow LFTs in a.m. -Also found to have erythematous duodenopathy  Hypokalemia -Replete  Hypomagnesemia  -Magnesium replaced -Check serum magnesium in a.m.  Hypertension -Blood pressure is mildly elevated Continue metoprolol, amlodipine  Metastatic rectal cancer -Followed by oncology, Dr. Truett Perna -Is following her in the hospital    Medications     amLODipine  10 mg Oral Daily   Chlorhexidine Gluconate Cloth  6 each Topical Daily   [START ON 02/27/2023] enoxaparin (LOVENOX) injection  40 mg Subcutaneous QHS   famotidine  20 mg Oral Daily   feeding supplement  1 Container Oral TID BM   lidocaine  1 patch Transdermal Q24H   metoprolol tartrate  50 mg Oral BID   pantoprazole (PROTONIX) IV  40 mg Intravenous Q12H   polyethylene glycol  17 g Oral BID   sodium chloride flush  10-40 mL Intracatheter  Q12H     Data Reviewed:   CBG:  No results for input(s): "GLUCAP" in the last 168 hours.  SpO2: 99 %    Vitals:   02/26/23 1435 02/26/23 1440 02/26/23 1445 02/26/23 1542  BP: (!) 117/92 122/88  (!) 141/105  Pulse: 93 93 92 80  Resp: 17 17 17 18   Temp:    97.6 F (36.4 C)  TempSrc:    Oral  SpO2: 99% 99% 99%   Weight:      Height:          Data Reviewed:  Basic Metabolic Panel: Recent Labs  Lab 02/24/23 1235 02/25/23 0029 02/25/23 0555 02/25/23 2225  NA 134*  --  129*  --   K 2.8*  --  2.8* 3.7  CL 93*  --  93*  --   CO2 34*  --  29  --   GLUCOSE 89  --  115*  --   BUN 5*  --  <5*  --   CREATININE 0.38* 0.45 0.39*  --   CALCIUM 9.5  --  8.1*  --   MG 1.6*  --   --   --     CBC: Recent Labs  Lab 02/24/23 1235 02/25/23 0029 02/25/23 0555  WBC 8.9 8.6 8.1  NEUTROABS 5.4  --   --   HGB 10.8* 10.4* 9.4*  HCT 32.3* 32.0* 29.2*  MCV 90.2 92.2 93.0  PLT 321 308 250    LFT Recent Labs  Lab 02/24/23 1235 02/25/23 0555  AST 23 29  ALT 20 22  ALKPHOS 422* 370*  BILITOT 0.5 0.5  PROT 7.3 6.4*  ALBUMIN 3.5 2.8*     Antibiotics: Anti-infectives (From admission, onward)    None        DVT prophylaxis: Lovenox  Code Status: Full code  Family Communication: No family at bedside   CONSULTS gastroenterology   Subjective   Status post ERCP, denies pain   Objective    Physical Examination:  Appears in no acute distress Abdomen is soft, nontender Extremities no edema   Status is: Inpatient:             Meredeth Ide   [facility] If 7PM-7AM, please contact night-coverage at [email or web address], Office  [phone]   02/26/2023, 5:27 PM  LOS: 1 day

## 2023-02-26 NOTE — Plan of Care (Signed)

## 2023-02-26 NOTE — Telephone Encounter (Signed)
ERCP recall has been entered for repeat in 6 months

## 2023-02-26 NOTE — Progress Notes (Addendum)
April [name]   DOB:02/11/1975   ZO#:[id number]      ASSESSMENT & PLAN:  1.  Abdominal pain - Likely secondary to metastatic colon cancer - Patient remains complaining of abdominal pain today, states it is still the same.   - States she does not feel that she gets any relief from MS Contin at home, recommended no further MS Contin.  Ordered Dilaudid 2 mg p.o. every 4 hours as needed for pain. -For endoscopy with ERCP today.  GI following closely.   2.  Sigmoid colon cancer, stage IV  (pT4a,pN2b,M1c)  -Initially diagnosed in 2021 -CT abdomen pelvis done 12/24/2022 showed 2.1 cm heterogeneous mass left hepatic close, likely mets not previously seen on scan 11/19/2022.  Also shows multiple peritoneal/omental implants. - s/p FOLFIRI/bevacizumab held, [place] given in 2021.  From 2022 to 2023 FOLFIRI with panitumumab was given.   -Maintenance Xeloda was given in 2022.  Lonsurf was given in 2023. Then in 2024 patient received FOLFOX therapy.   -Currently on salvage therapy with irinotecan/panitumumab.  Received cycle 3 on 02/17/2023 and tolerated well.     - Admitted with severe abdominal pain.  Patient undergoing endoscopy with ERCP today.  Will follow-up on results. -Medical oncology/Dr. Truett Perna following   3.  Anemia, normocytic - Likely due to malignancy plus chemotherapy treatments - Hemoglobin stable - Transfuse PRBC for hemoglobin <7.0.  No transfusional intervention warranted at this time. - Continue to monitor CBC with differential closely   4.  Hypertension - On antihypertensives at home, continue amlodipine 10 mg p.o. daily and metoprolol 50 mg p.o. twice daily. - Monitor BP levels  Code Status Full   Subjective:  Seen awake and alert laying supine in bed.  Reports ongoing abdominal pain.  Happy that she will have her procedure done today and she is hoping she will know why she has abdominal pain.  Objective:  Vitals:   02/25/23 2108 02/26/23 0520  BP: (!)  123/94 119/88  Pulse: 87 (!) 104  Resp: 14 16  Temp: 99 F (37.2 C) 98.8 F (37.1 C)  SpO2: 98% 99%     Intake/Output Summary (Last 24 hours) at 02/26/2023 9811 Last data filed at 02/25/2023 1802 Gross per 24 hour  Intake 1145.51 ml  Output --  Net 1145.51 ml     REVIEW OF SYSTEMS:   Constitutional: Denies fevers, chills or abnormal night sweats Eyes: Denies blurriness of vision, double vision or watery eyes Ears, nose, mouth, throat, and face: Denies mucositis or sore throat Respiratory: Denies cough, dyspnea or wheezes Cardiovascular: Denies palpitation, chest discomfort or lower extremity swelling Gastrointestinal: + Abdominal pain  skin: Denies abnormal skin rashes Lymphatics: Denies new lymphadenopathy or easy bruising Neurological: Denies numbness, tingling or new weaknesses Behavioral/Psych: Mood is stable, no new changes  All other systems were reviewed with the patient and are negative.  PHYSICAL EXAMINATION: ECOG PERFORMANCE STATUS: 2 - Symptomatic, <50% confined to bed  Vitals:   02/25/23 2108 02/26/23 0520  BP: (!) 123/94 119/88  Pulse: 87 (!) 104  Resp: 14 16  Temp: 99 F (37.2 C) 98.8 F (37.1 C)  SpO2: 98% 99%   Filed Weights   02/24/23 2231  Weight: 133 lb 9.6 oz (60.6 kg)    GENERAL: alert, no distress and comfortable SKIN: skin color, texture, turgor are normal, no rashes or significant lesions EYES: normal, conjunctiva are pink and non-injected, sclera clear OROPHARYNX: no exudate, no erythema and lips, buccal mucosa, and tongue normal  NECK: supple,  thyroid normal size, non-tender, without nodularity LYMPH: no palpable lymphadenopathy in the cervical, axillary or inguinal LUNGS: clear to auscultation and percussion with normal breathing effort HEART: regular rate & rhythm and no murmurs and no lower extremity edema ABDOMEN: abdomen soft, non-tender and normal bowel sounds MUSCULOSKELETAL: no cyanosis of digits and no clubbing  PSYCH: alert  & oriented x 3 with fluent speech NEURO: no focal motor/sensory deficits   All questions were answered. The patient knows to call the clinic with any problems, questions or concerns.   The total time spent in the appointment was 40 minutes encounter with patient including review of chart and various tests results, discussions about plan of care and coordination of care plan  Dawson Bills, NP 02/26/2023 9:47 AM    Labs Reviewed:  Lab Results  Component Value Date   WBC 8.1 02/25/2023   HGB 9.4 (L) 02/25/2023   HCT 29.2 (L) 02/25/2023   MCV 93.0 02/25/2023   PLT 250 02/25/2023   Recent Labs    03/27/22 1212 03/31/22 0945 11/21/22 0557 11/22/22 0710 12/16/22 0505 12/17/22 0500 02/17/23 0750 02/24/23 1235 02/25/23 0029 02/25/23 0555 02/25/23 2225  NA  --    < > 138   < > 132*   < > 136 134*  --  129*  --   K 3.8   < > 3.5   < > 2.9*   < > 3.4* 2.8*  --  2.8* 3.7  CL  --    < > 101   < > 97*   < > 99 93*  --  93*  --   CO2  --    < > 24   < > 29   < > 28 34*  --  29  --   GLUCOSE  --    < > 139*   < > 97   < > 97 89  --  115*  --   BUN  --    < > 9   < > 8   < > 6 5*  --  <5*  --   CREATININE  --    < > 0.58   < > 0.52   < > 0.40* 0.38* 0.45 0.39*  --   CALCIUM  --    < > 10.3   < > 8.5*   < > 9.5 9.5  --  8.1*  --   GFRNONAA  --    < > >60   < > >60   < > >60 >60 >60 >60  --   PROT 6.8   < > 8.9*   < > 7.1   < > 7.6 7.3  --  6.4*  --   ALBUMIN 3.2*   < > 4.4  4.5   < > 3.2*   < > 3.7 3.5  --  2.8*  --   AST 78*   < > 35   < > 37   < > 33 23  --  29  --   ALT 172*   < > 24   < > 28   < > 26 20  --  22  --   ALKPHOS 506*   < > 172*   < > 204*   < > 432* 422*  --  370*  --   BILITOT 2.3*   < > 0.6   < > 0.5   < > 0.4 0.5  --  0.5  --  BILIDIR 1.1*  --  <0.1  --  0.1  --   --   --   --   --   --   IBILI 1.2*  --  NOT CALCULATED  --  0.4  --   --   --   --   --   --    < > = values in this interval not displayed.    Studies Reviewed:  CT ABDOMEN PELVIS W  CONTRAST Result Date: 02/24/2023 CLINICAL DATA:  Abdominal pain EXAM: CT ABDOMEN AND PELVIS WITH CONTRAST TECHNIQUE: Multidetector CT imaging of the abdomen and pelvis was performed using the standard protocol following bolus administration of intravenous contrast. RADIATION DOSE REDUCTION: This exam was performed according to the departmental dose-optimization program which includes automated exposure control, adjustment of the mA and/or kV according to patient size and/or use of iterative reconstruction technique. CONTRAST:  75mL OMNIPAQUE IOHEXOL 300 MG/ML  SOLN COMPARISON:  01/24/2023 FINDINGS: Lower chest: No acute abnormality. Hepatobiliary: Redemonstration of subtly hypoattenuating lesion within the peripheral aspect of hepatic segment 2 (series 2, image 20). Additional scattered subcentimeter low-density lesions within the liver remain too small to characterize. Prominent intrahepatic biliary dilatation with biliary stents in place. Slightly hyperattenuating material fills the gallbladder. No pericholecystic inflammatory changes by CT. Pancreas: Stable mild pancreatic ductal dilatation. No inflammatory changes. Spleen: Normal in size without focal abnormality. Adrenals/Urinary Tract: Unremarkable adrenal glands. Stable bilateral nephrolithiasis with stones measuring up to 1.1 cm on the left and 0.9 cm on the right. No hydronephrosis. Urinary bladder within normal limits for the degree of distension. Stomach/Bowel: Stomach is within normal limits. Appendix appears normal. Similar degree of wall thickening of the rectosigmoid colon. No new sites of bowel wall thickening or inflammation. Vascular/Lymphatic: Scattered aortoiliac atherosclerotic calcifications without aneurysm. No abdominopelvic lymphadenopathy. Reproductive: Uterus and bilateral adnexa are unremarkable. Other: Similar degree of nodularity along the omentum and left pericolic gutter. No ascites or pneumoperitoneum. Musculoskeletal: No lytic or  sclerotic bone lesion. Grade 2 anterolisthesis of L5 on S1 secondary to chronic bilateral L5 pars interarticularis defects. IMPRESSION: 1. No new or acute abdominopelvic findings. 2. 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 3. Prominent intrahepatic biliary dilatation with biliary stents in place. 4. Stable bilateral nephrolithiasis. 5. Aortic atherosclerosis (ICD10-I70.0). Electronically Signed   By: Duanne Guess D.O.   On: 02/24/2023 18:49  Ms. Nienaber was interviewed and examined.  She continues to have abdominal pain.  The pain is relieved with IV Dilaudid.  She feels MS Contin did not help.  She is scheduled for an upper endoscopy/ERCP today.  The pain may be related to carcinomatosis, tumor involving the upper GI tract, or a nonmalignant GI process.  Recommendations: Try oral Dilaudid for pain EGD/ERCP as recommended by GI Outpatient follow-up will be scheduled at the Cancer center

## 2023-02-26 NOTE — Op Note (Signed)
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 Estimated Blood Loss:     Estimated blood loss was minimal. Procedure:                Pre-Anesthesia Assessment:                           - Prior to the procedure, a History and Physical                            was performed, and patient medications and                            allergies were reviewed. The patient's tolerance of                            previous anesthesia was also reviewed. The risks                            and benefits of the procedure and the sedation                            options and risks were discussed with the patient.  All questions were answered, and informed consent                            was obtained. Prior Anticoagulants: The patient has                            taken Lovenox (enoxaparin), last dose was 1 day                            prior to procedure. ASA Grade Assessment: III - A                            patient with severe systemic disease. After                             reviewing the risks and benefits, the patient was                            deemed in satisfactory condition to undergo the                            procedure.                           After obtaining informed consent, the scope was                            passed under direct vision. Throughout the                            procedure, the patient's blood pressure, pulse, and                            oxygen saturations were monitored continuously. The                            GIF-H190 ([id number]) Olympus endoscope was introduced                            through the mouth, and used to inject contrast into                            and used to locate the major papilla. The [id number]-Q190V                            ([id number]) Olympus duodenoscope was introduced                            through the mouth, and used to inject contrast into                            and used to cannulate the bile duct. The ERCP was  technically difficult and complex due to difficulty                            passing guidewires through biliary ductal stenosis.                            Successful completion of the procedure was aided by                            performing the maneuvers documented (below) in this                            report. The patient tolerated the procedure. Scope In: Scope Out: Findings:      2 biliary stents were visible on the scout film.      A standard esophagogastroduodenoscopy scope was used for the examination       of the upper gastrointestinal tract. The scope was passed under direct       vision through the upper GI tract. No gross lesions were noted in the       entire esophagus. The Z-line was irregular and was found 45 cm from the       incisors. Patchy mildly erythematous mucosa without bleeding was found       in the gastric antrum. No other gross lesions were noted in the entire       examined stomach; biopsy for H. pylori  assessment. Patchy mildly       erythematous mucosa was found in the duodenal bulb, in the first portion       of the duodenum and in the second portion of the duodenum. A biliary       sphincterotomy had been performed. The sphincterotomy appeared open. Two       plastic biliary stents originating in the biliary tree were emerging       from the major papilla. The stents were visibly patent. Two stents were       removed from the biliary tree using a Raptor grasping device.      A 0.035 inch x 260 cm straight Hydra Jagwire was passed into the       presumed left hepatic biliary tree. A second 0.035 inch x 260 cm       straight Hydra Jagwire was passed into the presumed right hepatic       biliary tree with great difficulty. The Hydratome sphincterotome was       passed over the guidewire and the bile duct was then deeply cannulated.       Contrast was injected. I personally interpreted the bile duct images.       Ductal flow of contrast was adequate. Image quality was adequate.       Contrast extended to the hepatic ducts. Opacification of the entire       biliary tree except for the cystic duct and gallbladder was successful.       The left and right hepatic ducts separately (Bismuth II) contained two       moderate stenoses 10-15 mm in length. The left and right hepatic ducts       and all intrahepatic branches were moderately dilated, secondary to       aforementioned strictures. The largest diameter was 10 mm within the  hepatic ducts. To discover objects, the biliary tree was swept with a       retrieval balloon. Sludge was swept from the duct. Dilation of the       hepatic duct bifurcation and the left main hepatic duct and the right       main hepatic duct (at the area of stenoses) with a 4 mm HurriCaine       balloon dilator was successful. To discover objects, the biliary tree       was swept with a retrieval balloon. Nothing was found.      One 8.5 Fr by 15 cm transpapillary  plastic biliary stent with a single       external flap and a single internal flap was placed into the left       hepatic duct. The stent was in good position. One 7 Fr by 12 cm       transpapillary plastic biliary stent with a single external flap and a       single internal flap was placed into the right hepatic duct. The stent       was complicated by inability for it to release from the device.       Unfortunately this led to the need of removing the wire and stent from       the biliary tree.      A 0.035 inch x 260 cm straight Hydra Jagwire was passed into the biliary       tree unfortunately we could not gain access back into the right hepatic       system with the currently placed left hepatic duct stent.      We had to remove the previously placed left hepatic duct stent and       restart.      A 0.035 inch x 260 cm straight Hydra Jagwire passed successfully into       the left main hepatic duct and then a second 0.035 inch x 260 cm       straight Hydra Jagwire was placed into the right main hepatic duct.      One 10 Fr by 15 cm transpapillary plastic biliary stent with a single       external flap and a single internal flap was placed into the left       hepatic duct. The stent was in good position.      One 7 Fr by 12 cm transpapillary plastic biliary stent with a single       external flap and a single internal flap was placed into the right       hepatic duct. The stent was in good position.      A formal pancreatogram was not performed.      The duodenoscope was withdrawn from the patient. Impression:               - No gross lesions in the entire esophagus. Z-line                            irregular, 45 cm from the incisors.                           - Erythematous mucosa in the antrum. No other gross  lesions in the entire stomach. Biopsied.                           - Erythematous duodenopathy.                           - Prior biliary  sphincterotomy appeared open.                           - Two visibly patent stents from the biliary tree                            were seen in the major papilla. These were removed.                           - Two moderate biliary strictures were found in the                            hepatic duct system (Bismuth II). The strictures                            were malignant appearing.                           - The left and right hepatic ducts and all                            intrahepatic branches were moderately dilated,                            secondary to aforementioned strictures. The                            strictures were dilated.                           - The biliary tree was swept and sludge was found.                           - One plastic biliary stent was placed into the                            left hepatic duct. Subsequently a device failure                            led to inability to place adequate right hepatic                            duct stenting. This required [place] to remove both                            stents and restart cannulation of both the left and  right systems.                           - One plastic biliary stent was placed into the                            left hepatic duct (10 [place] by 15 cm). One plastic                            biliary stent was placed into the right hepatic                            duct (7 [place] by 12 cm). Moderate Sedation:      Not Applicable - Patient had care per Anesthesia. Recommendation:           - The patient will be observed post-procedure,                            until all discharge criteria are met.                           - Return patient to hospital ward for ongoing care.                           - Advance diet as tolerated.                           - Observe patient's clinical course.                           - Check liver enzymes (AST, ALT, alkaline                             phosphatase, bilirubin) in the morning.                           - Observe patient's clinical course.                           - Repeat ERCP in 6-12 months to exchange stents.                           - Watch for pancreatitis, bleeding, perforation,                            and cholangitis.                           - The findings and recommendations were discussed                            with the patient.                           - The findings and recommendations were discussed  with the referring physician. Procedure Code(s):        --- Professional ---                           [phone], Endoscopic retrograde                            cholangiopancreatography (ERCP); with removal and                            exchange of stent(s), biliary or pancreatic duct,                            including pre- and post-dilation and guide wire                            passage, when performed, including sphincterotomy,                            when performed, each stent exchanged                           43276, 59, Endoscopic retrograde                            cholangiopancreatography (ERCP); with removal and                            exchange of stent(s), biliary or pancreatic duct,                            including pre- and post-dilation and guide wire                            passage, when performed, including sphincterotomy,                            when performed, each stent exchanged                           43276, 59, Endoscopic retrograde                            cholangiopancreatography (ERCP); with removal and                            exchange of stent(s), biliary or pancreatic duct,                            including pre- and post-dilation and guide wire                            passage, when performed, including sphincterotomy,                            when performed, each stent exchanged  [id number], 59, Endoscopic retrograde                            cholangiopancreatography (ERCP); with removal and                            exchange of stent(s), biliary or pancreatic duct,                            including pre- and post-dilation and guide wire                            passage, when performed, including sphincterotomy,                            when performed, each stent exchanged                           43264, Endoscopic retrograde                            cholangiopancreatography (ERCP); with removal of                            calculi/debris from biliary/pancreatic duct(s)                           74328, 26, Endoscopic catheterization of the                            biliary ductal system, radiological supervision and                            interpretation Diagnosis Code(s):        --- Professional ---                           K22.89, Other specified disease of esophagus                           K31.89, Other diseases of stomach and duodenum                           Z96.89, Presence of other specified functional                            implants                           K83.1, Obstruction of bile duct                           Z46.59, Encounter for fitting and adjustment of                            other gastrointestinal appliance and device  R10.84, Generalized abdominal pain                           R74.8, Abnormal levels of other serum enzymes CPT copyright 2022 [employer]. All rights reserved. The codes documented in this report are preliminary and upon coder review may  be revised to meet current compliance requirements. Corliss Parish, MD 02/26/2023 2:19:11 PM Number of Addenda: 0

## 2023-02-26 NOTE — Anesthesia Procedure Notes (Signed)
Procedure Name: Intubation Date/Time: 02/26/2023 11:44 AM  Performed by: Carloyn Manner, CRNAPre-anesthesia Checklist: Patient identified, Emergency Drugs available, Suction available, Patient being monitored and Timeout performed Patient Re-evaluated:Patient Re-evaluated prior to induction Oxygen Delivery Method: Circle system utilized Preoxygenation: Pre-oxygenation with 100% oxygen Induction Type: IV induction Ventilation: Mask ventilation without difficulty Laryngoscope Size: Miller and 2 Grade View: Grade II Tube size: 7.0 mm Number of attempts: 1 Airway Equipment and Method: Stylet Placement Confirmation: ETT inserted through vocal cords under direct vision, positive ETCO2 and breath sounds checked- equal and bilateral Secured at: 24 cm Tube secured with: Tape Dental Injury: Teeth and Oropharynx as per pre-operative assessment

## 2023-02-26 NOTE — Anesthesia Postprocedure Evaluation (Signed)
Anesthesia Post Note  Patient: April [name]  Procedure(s) Performed: ENDOSCOPIC RETROGRADE CHOLANGIOPANCREATOGRAPHY (ERCP) WITH PROPOFOL ESOPHAGOGASTRODUODENOSCOPY (EGD) WITH PROPOFOL REMOVAL OF Sludge BIOPSY BALLOON DILATION STENT REMOVAL BILIARY STENT PLACEMENT     Patient location during evaluation: PACU Anesthesia Type: General Level of consciousness: awake Pain management: pain level controlled Vital Signs Assessment: post-procedure vital signs reviewed and stable Respiratory status: spontaneous breathing, nonlabored ventilation and respiratory function stable Cardiovascular status: blood pressure returned to baseline and stable Postop Assessment: no apparent nausea or vomiting Anesthetic complications: no   No notable events documented.  Last Vitals:  Vitals:   02/26/23 1445 02/26/23 1542  BP:  (!) 141/105  Pulse: 92 80  Resp: 17 18  Temp:  36.4 C  SpO2: 99%     Last Pain:  Vitals:   02/26/23 1542  TempSrc: Oral  PainSc:                  Linton Rump

## 2023-02-26 NOTE — Interval H&P Note (Signed)
History and Physical Interval Note:  02/26/2023 10:45 AM  April [name]  has presented today for surgery, with the diagnosis of malignant biliary stricture.  The various methods of treatment have been discussed with the patient and family. After consideration of risks, benefits and other options for treatment, the patient has consented to  Procedure(s) with comments: ENDOSCOPIC RETROGRADE CHOLANGIOPANCREATOGRAPHY (ERCP) WITH PROPOFOL (N/A) ESOPHAGOGASTRODUODENOSCOPY (EGD) WITH PROPOFOL (N/A) - patient needs EGD in addition to ERCP as a surgical intervention.  The patient's history has been reviewed, patient examined, no change in status, stable for surgery.  I have reviewed the patient's chart and labs.  Questions were answered to the patient's satisfaction.    The risks of an ERCP were discussed at length, including but not limited to the risk of perforation, bleeding, abdominal pain, post-ERCP pancreatitis (while usually mild can be severe and even life threatening).    [employer]

## 2023-02-26 NOTE — Telephone Encounter (Signed)
-----   Message from [facility] sent at 02/26/2023  3:34 PM EST ----- Regarding: ERCP recall April [name], Place recall ERCP for 6 months from now (I have replaced her stents). Thanks. GM

## 2023-02-26 NOTE — Transfer of Care (Signed)
Immediate Anesthesia Transfer of Care Note  Patient: April [name]  Procedure(s) Performed: ENDOSCOPIC RETROGRADE CHOLANGIOPANCREATOGRAPHY (ERCP) WITH PROPOFOL ESOPHAGOGASTRODUODENOSCOPY (EGD) WITH PROPOFOL REMOVAL OF Sludge BIOPSY BALLOON DILATION STENT REMOVAL BILIARY STENT PLACEMENT  Patient Location: PACU  Anesthesia Type:General  Level of Consciousness: awake  Airway & Oxygen Therapy: Patient Spontanous Breathing  Post-op Assessment: Report given to RN and Post -op Vital signs reviewed and stable  Post vital signs: Reviewed and stable  Last Vitals:  Vitals Value Taken Time  BP 119/92 02/26/23 1400  Temp 36.5 02/26/23 1400  Pulse 117 02/26/23 1400  Resp 16 02/26/23 1400  SpO2 100 % 02/26/23 1400  Vitals shown include unfiled device data.  Last Pain:  Vitals:   02/26/23 1030  TempSrc: Temporal  PainSc: 7       Patients Stated Pain Goal: 7 (02/26/23 1030)  Complications: No notable events documented.

## 2023-02-27 ENCOUNTER — Other Ambulatory Visit: Payer: Self-pay | Admitting: Oncology

## 2023-02-27 DIAGNOSIS — R112 Nausea with vomiting, unspecified: Secondary | ICD-10-CM

## 2023-02-27 DIAGNOSIS — G893 Neoplasm related pain (acute) (chronic): Secondary | ICD-10-CM | POA: Diagnosis not present

## 2023-02-27 DIAGNOSIS — R1013 Epigastric pain: Secondary | ICD-10-CM | POA: Diagnosis not present

## 2023-02-27 DIAGNOSIS — R142 Eructation: Secondary | ICD-10-CM | POA: Diagnosis not present

## 2023-02-27 DIAGNOSIS — Z9889 Other specified postprocedural states: Secondary | ICD-10-CM | POA: Diagnosis not present

## 2023-02-27 DIAGNOSIS — R1010 Upper abdominal pain, unspecified: Secondary | ICD-10-CM | POA: Diagnosis not present

## 2023-02-27 DIAGNOSIS — C20 Malignant neoplasm of rectum: Secondary | ICD-10-CM | POA: Diagnosis not present

## 2023-02-27 LAB — COMPREHENSIVE METABOLIC PANEL
ALT: 20 U/L (ref 0–44)
AST: 19 U/L (ref 15–41)
Albumin: 3.1 g/dL — ABNORMAL LOW (ref 3.5–5.0)
Alkaline Phosphatase: 404 U/L — ABNORMAL HIGH (ref 38–126)
Anion gap: 12 (ref 5–15)
BUN: 12 mg/dL (ref 6–20)
CO2: 29 mmol/L (ref 22–32)
Calcium: 9.8 mg/dL (ref 8.9–10.3)
Chloride: 96 mmol/L — ABNORMAL LOW (ref 98–111)
Creatinine, Ser: 0.58 mg/dL (ref 0.44–1.00)
GFR, Estimated: >60 mL/min (ref >60)
Glucose, Bld: 132 mg/dL — ABNORMAL HIGH (ref 70–99)
Potassium: 3.7 mmol/L (ref 3.5–5.1)
Sodium: 137 mmol/L (ref 135–145)
Total Bilirubin: 0.3 mg/dL (ref 0.0–1.2)
Total Protein: 7.2 g/dL (ref 6.5–8.1)

## 2023-02-27 LAB — CBC
HCT: 31.1 % — ABNORMAL LOW (ref 36.0–46.0)
Hemoglobin: 10.1 g/dL — ABNORMAL LOW (ref 12.0–15.0)
MCH: 30.1 pg (ref 26.0–34.0)
MCHC: 32.5 g/dL (ref 30.0–36.0)
MCV: 92.6 fL (ref 80.0–100.0)
Platelets: 329 10*3/uL (ref 150–400)
RBC: 3.36 MIL/uL — ABNORMAL LOW (ref 3.87–5.11)
RDW: 15.2 % (ref 11.5–15.5)
WBC: 9.1 10*3/uL (ref 4.0–10.5)
nRBC: 0 % (ref 0.0–0.2)

## 2023-02-27 LAB — SURGICAL PATHOLOGY

## 2023-02-27 MED ORDER — HYDROMORPHONE HCL 2 MG PO TABS: 2.0000 mg | ORAL_TABLET | Freq: Four times a day (QID) | ORAL | 0 refills | Status: DC | PRN

## 2023-02-27 MED ORDER — POLYETHYLENE GLYCOL 3350 17 G PO PACK: 17.0000 g | PACK | Freq: Every day | ORAL | 0 refills | Status: DC | PRN

## 2023-02-27 MED ORDER — PANTOPRAZOLE SODIUM 40 MG PO TBEC: 40.0000 mg | DELAYED_RELEASE_TABLET | Freq: Every day | ORAL | 6 refills | Status: DC

## 2023-02-27 NOTE — Plan of Care (Signed)

## 2023-02-27 NOTE — Discharge Summary (Addendum)
Physician Discharge Summary   Patient: April [name] MRN: [id number] DOB: 09-17-1975  Admit date:     02/24/2023  Discharge date: 02/27/23  Discharge Physician: Meredeth Ide   PCP: Tollie Eth, NP   Recommendations at discharge:   Follow-up oncology as outpatient Follow-up EGD pathology for H. pylori  Discharge Diagnoses: Principal Problem:   Upper abdominal pain Active Problems:   Cancer related pain   Rectal cancer (HCC)   Hypertension   Hypokalemia   Metastatic colon cancer to liver (HCC)   Nausea and vomiting  Resolved Problems:   * No resolved hospital problems. *  Hospital Course: 48 y.o. female with medical history significant of metastatic rectal carcinoma, essential hypertension, renal calculi ascending aortic aneurysm biliary dilatation, who presents to the [facility] emergency room with severe abdominal pain pain is persistent in nature 10 out of 10 at epigastrium to right upper quadrant.  Associated with some nausea vomiting.  CT abdomen pelvis showed no new abdominal pelvic findings. There is similar findings compatible with known metastatic rectal cancer with hepatic and omental or peritoneal involvement. There is prominent intrahepatic biliary dilatation also. This could be the cause of her pain.   Assessment and Plan:  Abdominal pain -Presented with epigastric pain -CT abdomen/pelvis was unremarkable -Lipase is 20 -Oncology does not think that abdominal pain is due to liver metastasis -Gastroenterology was consulted, underwent ERCP with placement of right and left hepatic duct stents -Also found to have erythematous duodenopathy -No evidence of pancreatitis this morning -Will discharge on Protonix 40 mg p.o. daily before breakfast -Follow-up EGD pathology for H. pylori and treat if positive -Will discharge on Dilaudid 2 mg p.o. every 6 hours as needed as per oncology recommendation.  Discontinue MS Contin and Vicodin    Hypokalemia -Replete   Hypomagnesemia  -Magnesium replaced    Hypertension Continue metoprolol, amlodipine   Metastatic rectal cancer -Followed by oncology, Dr. Truett Perna -Is following her in the hospital          Consultants: Gastroenterology Procedures performed: EGD/ERCP Disposition: Home Diet recommendation:  Discharge Diet Orders (From admission, onward)     Start     Ordered   02/27/23 0000  Diet - low sodium heart healthy        02/27/23 1133           Regular diet DISCHARGE MEDICATION: Allergies as of 02/27/2023       Reactions   Lisinopril Swelling, Other (See Comments)   Lip swelling and Angioedema   Irinotecan Other (See Comments)   Stomach cramping Patient given 0.5 mL atropine IV and was able to continue infusion.  See Progress note on 10/03/20.    Leucovorin Calcium Itching   Patient reported itchy palms towards end of transfusion, see progress note 08/04/22.   Oxaliplatin Itching   Patient reported itchy palms towards end of transfusion, see progress note on 08/04/22.        Medication List     STOP taking these medications    doxycycline 100 MG tablet Commonly known as: VIBRA-TABS   HYDROcodone-acetaminophen 10-325 MG tablet Commonly known as: NORCO   morphine 30 MG 12 hr tablet Commonly known as: MS CONTIN   naproxen sodium 220 MG tablet Commonly known as: ALEVE       TAKE these medications    amLODipine 10 MG tablet Commonly known as: NORVASC Take 1 tablet (10 mg total) by mouth daily.   dexamethasone 4 MG tablet Commonly known as: DECADRON Take 1  tablet by mouth twice a day for 3 days beginning day 2 of chemo.   dicyclomine 20 MG tablet Commonly known as: BENTYL Take 1 tablet (20 mg total) by mouth 3 (three) times daily as needed for spasms (rectal/AB pain).   HYDROmorphone 2 MG tablet Commonly known as: Dilaudid Take 1 tablet (2 mg total) by mouth every 6 (six) hours as needed for severe pain (pain score  7-10).   lactulose 10 GM/15ML solution Commonly known as: CHRONULAC Take 15-30 mLs by mouth daily as needed for mild constipation.   magic mouthwash (nystatin, diphenhydrAMINE, alum & mag hydroxide) suspension mixture Swish and Spit 5 mLs by mouth 4 (four) times daily as needed.   metoprolol tartrate 50 MG tablet Commonly known as: LOPRESSOR Take 1 tablet (50 mg total) by mouth 2 (two) times daily.   ondansetron 8 MG tablet Commonly known as: ZOFRAN Take 1 tablet (8 mg total) by mouth every 8 (eight) hours as needed for nausea or vomiting. Can start taking 72 hours after chemotherapy   pantoprazole 40 MG tablet Commonly known as: PROTONIX Take 1 tablet (40 mg total) by mouth daily before breakfast. What changed: when to take this   polyethylene glycol 17 g packet Commonly known as: MIRALAX / GLYCOLAX Take 17 g by mouth daily as needed.   potassium chloride 10 MEQ CR capsule Commonly known as: MICRO-K Take 2 capsules (20 mEq total) by mouth 2 (two) times daily.   prochlorperazine 10 MG tablet Commonly known as: COMPAZINE Take 1 tablet (10 mg total) by mouth every 6 (six) hours as needed for nausea or vomiting.        Discharge Exam: Filed Weights   02/24/23 2231  Weight: 60.6 kg   General-appears in no acute distress Heart-S1-S2, regular, no murmur auscultated Lungs-clear to auscultation bilaterally, no wheezing or crackles auscultated Abdomen-soft, nontender, no organomegaly Extremities-no edema in the lower extremities Neuro-alert, oriented x3, no focal deficit noted  Condition at discharge: good  The results of significant diagnostics from this hospitalization (including imaging, microbiology, ancillary and laboratory) are listed below for reference.   Imaging Studies: DG ERCP Result Date: 02/27/2023 CLINICAL DATA:  Encounter for removal of biliary stent. EXAM: ERCP TECHNIQUE: Multiple spot images obtained with the fluoroscopic device and submitted for  interpretation post-procedure. FLUOROSCOPY: Radiation Exposure Index (as provided by the fluoroscopic device): 106.44 mGy Kerma COMPARISON:  CT 02/24/2023 FINDINGS: Initial image demonstrates 2 plastic biliary stents. Biliary stents removed. Retrograde cholangiogram was performed. Cholangiogram images demonstrate narrowing and irregularity near the biliary confluence and involving the left and right hepatic ducts. Wires were advanced into the biliary ducts. Evidence for balloon dilatation of the biliary ducts. Final images demonstrate placement of 2 new plastic biliary stents presumably in the left and right hepatic ducts. IMPRESSION: 1. 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 These areas were dilated and new biliary stents were placed. These images were submitted for radiologic interpretation only. Please see the procedural report. Electronically Signed   By: Richarda Overlie M.D.   On: 02/27/2023 09:03   CT ABDOMEN PELVIS W CONTRAST Result Date: 02/24/2023 CLINICAL DATA:  Abdominal pain EXAM: CT ABDOMEN AND PELVIS WITH CONTRAST TECHNIQUE: Multidetector CT imaging of the abdomen and pelvis was performed using the standard protocol following bolus administration of intravenous contrast. RADIATION DOSE REDUCTION: This exam was performed according to the departmental dose-optimization program which includes automated exposure control, adjustment of the mA and/or kV according to patient  size and/or use of iterative reconstruction technique. CONTRAST:  75mL OMNIPAQUE IOHEXOL 300 MG/ML  SOLN COMPARISON:  01/24/2023 FINDINGS: Lower chest: No acute abnormality. Hepatobiliary: Redemonstration of subtly hypoattenuating lesion within the peripheral aspect of hepatic segment 2 (series 2, image 20). Additional scattered subcentimeter low-density lesions within the liver remain too small to characterize. Prominent intrahepatic biliary dilatation with biliary stents in place.  Slightly hyperattenuating material fills the gallbladder. No pericholecystic inflammatory changes by CT. Pancreas: Stable mild pancreatic ductal dilatation. No inflammatory changes. Spleen: Normal in size without focal abnormality. Adrenals/Urinary Tract: Unremarkable adrenal glands. Stable bilateral nephrolithiasis with stones measuring up to 1.1 cm on the left and 0.9 cm on the right. No hydronephrosis. Urinary bladder within normal limits for the degree of distension. Stomach/Bowel: Stomach is within normal limits. Appendix appears normal. Similar degree of wall thickening of the rectosigmoid colon. No new sites of bowel wall thickening or inflammation. Vascular/Lymphatic: Scattered aortoiliac atherosclerotic calcifications without aneurysm. No abdominopelvic lymphadenopathy. Reproductive: Uterus and bilateral adnexa are unremarkable. Other: Similar degree of nodularity along the omentum and left pericolic gutter. No ascites or pneumoperitoneum. Musculoskeletal: No lytic or sclerotic bone lesion. Grade 2 anterolisthesis of L5 on S1 secondary to chronic bilateral L5 pars interarticularis defects. IMPRESSION: 1. No new or acute abdominopelvic findings. 2. 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 3. Prominent intrahepatic biliary dilatation with biliary stents in place. 4. Stable bilateral nephrolithiasis. 5. Aortic atherosclerosis (ICD10-I70.0). Electronically Signed   By: Duanne Guess D.O.   On: 02/24/2023 18:49    Microbiology: Results for orders placed or performed during the hospital encounter of 01/24/23  C Difficile Quick Screen w PCR reflex     Status: Abnormal   Collection Time: 01/25/23 12:22 PM   Specimen: STOOL  Result Value Ref Range Status   C Diff antigen POSITIVE (A) NEGATIVE Final   C Diff toxin NEGATIVE NEGATIVE Final   C Diff interpretation Results are indeterminate. See PCR results.  Final    Comment:  VALID Performed at [facility], [address] [place], [place] [address]   C. Diff by PCR, Reflexed     Status: None   Collection Time: 01/25/23 12:22 PM  Result Value Ref Range Status   Toxigenic C. Difficile by PCR NEGATIVE NEGATIVE Final    Comment: Patient is colonized with non toxigenic C. difficile. May not need treatment unless significant symptoms are present. Performed at [facility], [address], [place], [place] [address]    *Note: Due to a large number of results and/or encounters for the requested time period, some results have not been displayed. A complete set of results can be found in Results Review.    Labs: CBC: Recent Labs  Lab 02/24/23 1235 02/25/23 0029 02/25/23 0555 02/27/23 0540  WBC 8.9 8.6 8.1 9.1  NEUTROABS 5.4  --   --   --   HGB 10.8* 10.4* 9.4* 10.1*  HCT 32.3* 32.0* 29.2* 31.1*  MCV 90.2 92.2 93.0 92.6  PLT 321 308 250 329   Basic Metabolic Panel: Recent Labs  Lab 02/24/23 1235 02/25/23 0029 02/25/23 0555 02/25/23 2225 02/27/23 0540  NA 134*  --  129*  --  137  K 2.8*  --  2.8* 3.7 3.7  CL 93*  --  93*  --  96*  CO2 34*  --  29  --  29  GLUCOSE 89  --  115*  --  132*  BUN  5*  --  <5*  --  12  CREATININE 0.38* 0.45 0.39*  --  0.58  CALCIUM 9.5  --  8.1*  --  9.8  MG 1.6*  --   --   --   --    Liver Function Tests: Recent Labs  Lab 02/24/23 1235 02/25/23 0555 02/27/23 0540  AST 23 29 19   ALT 20 22 20   ALKPHOS 422* 370* 404*  BILITOT 0.5 0.5 0.3  PROT 7.3 6.4* 7.2  ALBUMIN 3.5 2.8* 3.1*   CBG: No results for input(s): "GLUCAP" in the last 168 hours.  Discharge time spent: greater than 30 minutes.  Signed: Meredeth Ide, MD [facility] 02/27/2023

## 2023-02-27 NOTE — Progress Notes (Addendum)
April [name]   DOB:12/10/1975   YQ#:[id number]      ASSESSMENT & PLAN:  1.  Abdominal pain -Improving, patient rates as 3 of 10 today. - Likely secondary to metastatic colon cancer - Status post endoscopy with ERCP/stent exchange on 02/26/2023. -Please continue oral Dilaudid at home as patient states she does not receive relief from MS Contin or hydrocodone.  She has received Dilaudid 2 mg p.o. every 4 hours as needed with stated relief during hospitalization. -GI outpatient follow-up recommended - Okay to discharge from oncology point of view.   2.  Sigmoid colon cancer, stage IV  (pT4a,pN2b,M1c)  -Initially diagnosed in 2021 -CT abdomen pelvis done 12/24/2022 showed 2.1 cm heterogeneous mass left hepatic close, likely mets not previously seen on scan 11/19/2022.  Also shows multiple peritoneal/omental implants. - s/p FOLFIRI/bevacizumab held, [place] given in 2021.  From 2022 to 2023 FOLFIRI with panitumumab was given.   -Maintenance Xeloda was given in 2022.  Lonsurf was given in 2023. Then in 2024 patient received FOLFOX therapy.   -Currently on salvage therapy with irinotecan/panitumumab.  Received cycle 3 on 02/17/2023 and tolerated well.     -Medical oncology/Dr. Truett Perna following.  Patient has oncology outpatient appointment next week 03/03/2023.   3.  Anemia, normocytic - Likely due to malignancy plus chemotherapy treatments - Hemoglobin continues to improve 10.1 today. - Transfuse PRBC for hemoglobin <7.0.  No transfusional intervention warranted at this time. - Continue to monitor CBC with differential closely   4.  Hypertension - On antihypertensives at home, continue amlodipine 10 mg p.o. daily and metoprolol 50 mg p.o. twice daily. - Monitor BP levels    Code Status Full   Subjective:  Patient seen awake and alert laying supine in bed.  Reports that abdominal is much better rates as 3 out of 10.  Denies all other acute symptoms.  Patient is pleased with  discharge plans for today, aware of follow-up next week with medical oncology.  Objective:  Vitals:   02/26/23 2127 02/27/23 0453  BP: (!) 137/107 (!) 161/118  Pulse: 79 70  Resp: 16 16  Temp: 98.2 F (36.8 C) 97.8 F (36.6 C)  SpO2: 100% 100%     Intake/Output Summary (Last 24 hours) at 02/27/2023 1110 Last data filed at 02/27/2023 1025 Gross per 24 hour  Intake 810 ml  Output 1 ml  Net 809 ml     REVIEW OF SYSTEMS:   Constitutional: Denies fevers, chills or abnormal night sweats Eyes: Denies blurriness of vision, double vision or watery eyes Ears, nose, mouth, throat, and face: Denies mucositis or sore throat Respiratory: Denies cough, dyspnea or wheezes Cardiovascular: Denies palpitation, chest discomfort or lower extremity swelling Gastrointestinal: + Abdominal pain improving Skin: Denies abnormal skin rashes Lymphatics: Denies new lymphadenopathy or easy bruising Neurological: Denies numbness, tingling or new weaknesses Behavioral/Psych: Mood is stable, no new changes  All other systems were reviewed with the patient and are negative.  PHYSICAL EXAMINATION: ECOG PERFORMANCE STATUS: 1 - Symptomatic but completely ambulatory  Vitals:   02/26/23 2127 02/27/23 0453  BP: (!) 137/107 (!) 161/118  Pulse: 79 70  Resp: 16 16  Temp: 98.2 F (36.8 C) 97.8 F (36.6 C)  SpO2: 100% 100%   Filed Weights   02/24/23 2231  Weight: 133 lb 9.6 oz (60.6 kg)    GENERAL: alert, no distress and comfortable SKIN: skin color, texture, turgor are normal, no rashes or significant lesions EYES: normal, conjunctiva are pink and non-injected, sclera clear  OROPHARYNX: no exudate, no erythema and lips, buccal mucosa, and tongue normal  NECK: supple, thyroid normal size, non-tender, without nodularity LYMPH: no palpable lymphadenopathy in the cervical, axillary or inguinal LUNGS: clear to auscultation and percussion with normal breathing effort HEART: regular rate & rhythm and no  murmurs and no lower extremity edema ABDOMEN: abdomen soft, non-tender and normal bowel sounds MUSCULOSKELETAL: no cyanosis of digits and no clubbing  PSYCH: alert & oriented x 3 with fluent speech NEURO: no focal motor/sensory deficits   All questions were answered. The patient knows to call the clinic with any problems, questions or concerns.   The total time spent in the appointment was 40 minutes encounter with patient including review of chart and various tests results, discussions about plan of care and coordination of care plan  Dawson Bills, NP 02/27/2023 11:10 AM    Labs Reviewed:  Lab Results  Component Value Date   WBC 9.1 02/27/2023   HGB 10.1 (L) 02/27/2023   HCT 31.1 (L) 02/27/2023   MCV 92.6 02/27/2023   PLT 329 02/27/2023   Recent Labs    03/27/22 1212 03/31/22 0945 11/21/22 0557 11/22/22 0710 12/16/22 0505 12/17/22 0500 02/24/23 1235 02/25/23 0029 02/25/23 0555 02/25/23 2225 02/27/23 0540  NA  --    < > 138   < > 132*   < > 134*  --  129*  --  137  K 3.8   < > 3.5   < > 2.9*   < > 2.8*  --  2.8* 3.7 3.7  CL  --    < > 101   < > 97*   < > 93*  --  93*  --  96*  CO2  --    < > 24   < > 29   < > 34*  --  29  --  29  GLUCOSE  --    < > 139*   < > 97   < > 89  --  115*  --  132*  BUN  --    < > 9   < > 8   < > 5*  --  <5*  --  12  CREATININE  --    < > 0.58   < > 0.52   < > 0.38* 0.45 0.39*  --  0.58  CALCIUM  --    < > 10.3   < > 8.5*   < > 9.5  --  8.1*  --  9.8  GFRNONAA  --    < > >60   < > >60   < > >60 >60 >60  --  >60  PROT 6.8   < > 8.9*   < > 7.1   < > 7.3  --  6.4*  --  7.2  ALBUMIN 3.2*   < > 4.4  4.5   < > 3.2*   < > 3.5  --  2.8*  --  3.1*  AST 78*   < > 35   < > 37   < > 23  --  29  --  19  ALT 172*   < > 24   < > 28   < > 20  --  22  --  20  ALKPHOS 506*   < > 172*   < > 204*   < > 422*  --  370*  --  404*  BILITOT 2.3*   < > 0.6   < >  0.5   < > 0.5  --  0.5  --  0.3  BILIDIR 1.1*  --  <0.1  --  0.1  --   --   --   --   --   --   IBILI  1.2*  --  NOT CALCULATED  --  0.4  --   --   --   --   --   --    < > = values in this interval not displayed.    Studies Reviewed:  DG ERCP Result Date: 02/27/2023 CLINICAL DATA:  Encounter for removal of biliary stent. EXAM: ERCP TECHNIQUE: Multiple spot images obtained with the fluoroscopic device and submitted for interpretation post-procedure. FLUOROSCOPY: Radiation Exposure Index (as provided by the fluoroscopic device): 106.44 mGy Kerma COMPARISON:  CT 02/24/2023 FINDINGS: Initial image demonstrates 2 plastic biliary stents. Biliary stents removed. Retrograde cholangiogram was performed. Cholangiogram images demonstrate narrowing and irregularity near the biliary confluence and involving the left and right hepatic ducts. Wires were advanced into the biliary ducts. Evidence for balloon dilatation of the biliary ducts. Final images demonstrate placement of 2 new plastic biliary stents presumably in the left and right hepatic ducts. IMPRESSION: 1. 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 These areas were dilated and new biliary stents were placed. These images were submitted for radiologic interpretation only. Please see the procedural report. Electronically Signed   By: Richarda Overlie M.D.   On: 02/27/2023 09:03   CT ABDOMEN PELVIS W CONTRAST Result Date: 02/24/2023 CLINICAL DATA:  Abdominal pain EXAM: CT ABDOMEN AND PELVIS WITH CONTRAST TECHNIQUE: Multidetector CT imaging of the abdomen and pelvis was performed using the standard protocol following bolus administration of intravenous contrast. RADIATION DOSE REDUCTION: This exam was performed according to the departmental dose-optimization program which includes automated exposure control, adjustment of the mA and/or kV according to patient size and/or use of iterative reconstruction technique. CONTRAST:  75mL OMNIPAQUE IOHEXOL 300 MG/ML  SOLN COMPARISON:  01/24/2023 FINDINGS: Lower chest: No acute  abnormality. Hepatobiliary: Redemonstration of subtly hypoattenuating lesion within the peripheral aspect of hepatic segment 2 (series 2, image 20). Additional scattered subcentimeter low-density lesions within the liver remain too small to characterize. Prominent intrahepatic biliary dilatation with biliary stents in place. Slightly hyperattenuating material fills the gallbladder. No pericholecystic inflammatory changes by CT. Pancreas: Stable mild pancreatic ductal dilatation. No inflammatory changes. Spleen: Normal in size without focal abnormality. Adrenals/Urinary Tract: Unremarkable adrenal glands. Stable bilateral nephrolithiasis with stones measuring up to 1.1 cm on the left and 0.9 cm on the right. No hydronephrosis. Urinary bladder within normal limits for the degree of distension. Stomach/Bowel: Stomach is within normal limits. Appendix appears normal. Similar degree of wall thickening of the rectosigmoid colon. No new sites of bowel wall thickening or inflammation. Vascular/Lymphatic: Scattered aortoiliac atherosclerotic calcifications without aneurysm. No abdominopelvic lymphadenopathy. Reproductive: Uterus and bilateral adnexa are unremarkable. Other: Similar degree of nodularity along the omentum and left pericolic gutter. No ascites or pneumoperitoneum. Musculoskeletal: No lytic or sclerotic bone lesion. Grade 2 anterolisthesis of L5 on S1 secondary to chronic bilateral L5 pars interarticularis defects. IMPRESSION: 1. No new or acute abdominopelvic findings. 2. 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 3. Prominent intrahepatic biliary dilatation with biliary stents in place. 4. Stable bilateral nephrolithiasis. 5. Aortic atherosclerosis (ICD10-I70.0). Electronically Signed   By: Duanne Guess D.O.   On: 02/24/2023 18:49   Ms. Babel was interviewed and  examined.  She underwent an upper endoscopy/ERCP, and biliary stent  exchange procedure yesterday.  There was no evidence for malignancy or ulcers in the upper GI tract.  She reports improvement in nausea and abdominal pain today.  She reports pain relief with oral Dilaudid.  Ms. Westrich appears stable for discharge from an oncology standpoint.  Outpatient follow-up is scheduled the Cancer center next week.  She would like to delay the next cycle of chemotherapy for an additional week.  Recommendations: Continue oral Dilaudid for pain Continue antihypertensive regimen Outpatient follow-up as scheduled at the Cancer center 03/03/2023

## 2023-02-27 NOTE — Progress Notes (Signed)
Progress Note  Primary GI: Dr. Meridee Score DOA: 02/24/2023         Hospital Day: 4   Subjective  Chief Complaint:     AB pain   No family was present at the time of my evaluation. Patient with discomfort significantly improved, able to tolerate regular diet without post prandial pain. No fever, chills.  State overall much feeling much better, Dr. Alcide Evener also adjusted her pain medications.     Objective   Vital signs in last 24 hours: Temp:  [97.5 F (36.4 C)-98.2 F (36.8 C)] 97.8 F (36.6 C) (02/14 0453) Pulse Rate:  [70-99] 70 (02/14 0453) Resp:  [14-18] 16 (02/14 0453) BP: (117-161)/(9-118) 161/118 (02/14 0453) SpO2:  [98 %-100 %] 100 % (02/14 0453) Last BM Date : 02/26/23 Last BM recorded by nurses in past 5 days Stool Type: Type 6 (Mushy consistency with ragged edges) (02/26/2023  5:28 AM)  General:   Pleasant, well developed female in no acute distress Heart:  regular rate and rhythm, no murmurs or gallops Pulm: Clear anteriorly; no wheezing Abdomen:  Soft, Non-distended AB, active bowel sounds. Mild epigastric tenderness, no rebound Extremities:  Without edema. Msk:  Symmetrical without gross deformities. Peripheral pulses intact.  Neurologic:  Alert and  oriented x4;  No focal deficits.  Skin:   Dry and intact without significant lesions or rashes. Psychiatric:  Cooperative. Normal mood and affect.  Intake/Output from previous day: 02/13 0701 - 02/14 0700 In: 570 [P.O.:120; I.V.:250; IV Piggyback:200] Out: 1 [Urine:1] Intake/Output this shift: Total I/O In: 240 [P.O.:240] Out: -   Studies/Results: DG ERCP Result Date: 02/27/2023 CLINICAL DATA:  Encounter for removal of biliary stent. EXAM: ERCP TECHNIQUE: Multiple spot images obtained with the fluoroscopic device and submitted for interpretation post-procedure. FLUOROSCOPY: Radiation Exposure Index (as provided by the fluoroscopic device): 106.44 mGy Kerma COMPARISON:  CT 02/24/2023 FINDINGS: Initial image  demonstrates 2 plastic biliary stents. Biliary stents removed. Retrograde cholangiogram was performed. Cholangiogram images demonstrate narrowing and irregularity near the biliary confluence and involving the left and right hepatic ducts. Wires were advanced into the biliary ducts. Evidence for balloon dilatation of the biliary ducts. Final images demonstrate placement of 2 new plastic biliary stents presumably in the left and right hepatic ducts. IMPRESSION: 1. 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 These areas were dilated and new biliary stents were placed. These images were submitted for radiologic interpretation only. Please see the procedural report. Electronically Signed   By: Richarda Overlie M.D.   On: 02/27/2023 09:03    Lab Results: Recent Labs    02/25/23 0029 02/25/23 0555 02/27/23 0540  WBC 8.6 8.1 9.1  HGB 10.4* 9.4* 10.1*  HCT 32.0* 29.2* 31.1*  PLT 308 250 329   BMET Recent Labs    02/24/23 1235 02/25/23 0029 02/25/23 0555 02/25/23 2225 02/27/23 0540  NA 134*  --  129*  --  137  K 2.8*  --  2.8* 3.7 3.7  CL 93*  --  93*  --  96*  CO2 34*  --  29  --  29  GLUCOSE 89  --  115*  --  132*  BUN 5*  --  <5*  --  12  CREATININE 0.38* 0.45 0.39*  --  0.58  CALCIUM 9.5  --  8.1*  --  9.8   LFT Recent Labs    02/27/23 0540  PROT 7.2  ALBUMIN 3.1*  AST 19  ALT 20  ALKPHOS 404*  BILITOT 0.3   PT/INR No results for input(s): "LABPROT", "INR" in the last 72 hours.   Scheduled Meds:  amLODipine  10 mg Oral Daily   Chlorhexidine Gluconate Cloth  6 each Topical Daily   enoxaparin (LOVENOX) injection  40 mg Subcutaneous QHS   famotidine  20 mg Oral Daily   feeding supplement  1 Container Oral TID BM   lidocaine  1 patch Transdermal Q24H   metoprolol tartrate  50 mg Oral BID   pantoprazole (PROTONIX) IV  40 mg Intravenous Q12H   polyethylene glycol  17 g Oral BID   sodium chloride flush  10-40 mL Intracatheter  Q12H   Continuous Infusions:    Patient profile:   48 year old female with history of metastatic colon cancer complicated by bili obstruction status post ERCP with stenting in March of this year presents for worsening abdominal pain.    Impression/Plan:   Epigastric abdominal pain associated with some belching, nausea/vomiting CT shows well-placed biliary stent with prominent intra hepatic biliary dilation similar findings, known metastasis of rectal cancer with hepatic and omental/peritoneal  Liver function is stable with no elevation of bilirubin or alk phos.   02/13 ERCP EGD unremarkable other than gastritis s/p biopsy.  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 - One plastic biliary stent was placed into the left hepatic duct ( 10 [place] by 15 cm) . One plastic biliary stent was placed into the right hepatic duct ( 7 [place] by 12 cm) - patient with improved pain and no evidence of pancreatitis this AM - liver function normal, Alk phos slightly increased 370-404 overall stable, normal bili -Continue regular diet, tolerating well -Will need repeat ERCP 6-12 months for stent exchange - continue to maximize bowel regimen outpatient, miralax BID, senna as needed.  - continue pantoprazole 40 mg once daily outpatient 30 mins an hour before food - follow up on EGD pathology for H pylori, will treat is positive.  - continue follow up with oncology   Principal Problem:   Upper abdominal pain Active Problems:   Hypertension   Hypokalemia   Rectal cancer (HCC)   Metastatic colon cancer to liver (HCC)   Cancer related pain   Nausea and vomiting    LOS: 2 days   Doree Albee  02/27/2023, 10:28 AM

## 2023-03-02 ENCOUNTER — Encounter (HOSPITAL_COMMUNITY): Payer: Self-pay | Admitting: Gastroenterology

## 2023-03-03 ENCOUNTER — Inpatient Hospital Stay: Payer: 59

## 2023-03-03 ENCOUNTER — Encounter: Payer: Self-pay | Admitting: Nurse Practitioner

## 2023-03-03 ENCOUNTER — Ambulatory Visit: Payer: 59 | Admitting: Nutrition

## 2023-03-03 ENCOUNTER — Inpatient Hospital Stay (HOSPITAL_BASED_OUTPATIENT_CLINIC_OR_DEPARTMENT_OTHER): Payer: 59 | Admitting: Nurse Practitioner

## 2023-03-03 VITALS — BP 142/89 | HR 83 | Temp 98.1°F | Resp 18 | Ht 64.0 in | Wt 135.0 lb

## 2023-03-03 DIAGNOSIS — Z5112 Encounter for antineoplastic immunotherapy: Secondary | ICD-10-CM | POA: Diagnosis present

## 2023-03-03 DIAGNOSIS — C7951 Secondary malignant neoplasm of bone: Secondary | ICD-10-CM | POA: Diagnosis not present

## 2023-03-03 DIAGNOSIS — C7989 Secondary malignant neoplasm of other specified sites: Secondary | ICD-10-CM | POA: Diagnosis not present

## 2023-03-03 DIAGNOSIS — Z79899 Other long term (current) drug therapy: Secondary | ICD-10-CM | POA: Diagnosis not present

## 2023-03-03 DIAGNOSIS — C187 Malignant neoplasm of sigmoid colon: Secondary | ICD-10-CM | POA: Diagnosis present

## 2023-03-03 DIAGNOSIS — C786 Secondary malignant neoplasm of retroperitoneum and peritoneum: Secondary | ICD-10-CM | POA: Diagnosis not present

## 2023-03-03 DIAGNOSIS — C19 Malignant neoplasm of rectosigmoid junction: Secondary | ICD-10-CM | POA: Diagnosis not present

## 2023-03-03 DIAGNOSIS — C7962 Secondary malignant neoplasm of left ovary: Secondary | ICD-10-CM | POA: Diagnosis not present

## 2023-03-03 DIAGNOSIS — Z5111 Encounter for antineoplastic chemotherapy: Secondary | ICD-10-CM | POA: Diagnosis present

## 2023-03-03 DIAGNOSIS — Z79634 Long term (current) use of topoisomerase inhibitor: Secondary | ICD-10-CM | POA: Diagnosis not present

## 2023-03-03 LAB — CMP (CANCER CENTER ONLY)
ALT: 11 U/L (ref 0–44)
AST: 14 U/L — ABNORMAL LOW (ref 15–41)
Albumin: 3.4 g/dL — ABNORMAL LOW (ref 3.5–5.0)
Alkaline Phosphatase: 286 U/L — ABNORMAL HIGH (ref 38–126)
Anion gap: 11 (ref 5–15)
BUN: 8 mg/dL (ref 6–20)
CO2: 27 mmol/L (ref 22–32)
Calcium: 8.9 mg/dL (ref 8.9–10.3)
Chloride: 100 mmol/L (ref 98–111)
Creatinine: 0.52 mg/dL (ref 0.44–1.00)
GFR, Estimated: >60 mL/min (ref >60)
Glucose, Bld: 122 mg/dL — ABNORMAL HIGH (ref 70–99)
Potassium: 3.1 mmol/L — ABNORMAL LOW (ref 3.5–5.1)
Sodium: 138 mmol/L (ref 135–145)
Total Bilirubin: 0.2 mg/dL (ref 0.0–1.2)
Total Protein: 6.8 g/dL (ref 6.5–8.1)

## 2023-03-03 LAB — CEA (ACCESS): CEA (CHCC): 5.72 ng/mL — ABNORMAL HIGH (ref 0.00–5.00)

## 2023-03-03 LAB — MAGNESIUM: Magnesium: 1.7 mg/dL (ref 1.7–2.4)

## 2023-03-03 NOTE — Progress Notes (Signed)
Patient is requesting assistance with ONS through [employer]. Contacted patient by phone for follow up. She did not answer her phone but I left message for return call.

## 2023-03-03 NOTE — Progress Notes (Signed)
[facility] OFFICE PROGRESS NOTE   Diagnosis:  Colon cancer  INTERVAL HISTORY:   [name] returns as scheduled.  She underwent an ERCP 02/26/2023 while hospitalized.  2 moderate biliary strictures were found in the hepatic duct system, malignant appearing.  Stents were exchanged.  She feels well.  No nausea or vomiting.  No abdominal pain.  Bowels moving regularly without the aid of a laxative.  Appetite is better.  Objective:  Vital signs in last 24 hours:  Blood pressure (!) 142/89, pulse 83, temperature 98.1 F (36.7 C), temperature source Temporal, resp. rate 18, height 5\' 4"  (1.626 m), weight 135 lb (61.2 kg), last menstrual period 01/07/2006, SpO2 100%.    HEENT: No thrush or ulcers. Resp: Lungs clear bilaterally. Cardio: Regular rate and rhythm. GI: Abdomen is soft.  Tenderness across the upper abdomen.  No hepatomegaly.  No mass.  Bowel sounds active. Vascular: No leg edema. Neuro: Alert and oriented. PICC without erythema.  Lab Results:  Lab Results  Component Value Date   WBC 9.1 02/27/2023   HGB 10.1 (L) 02/27/2023   HCT 31.1 (L) 02/27/2023   MCV 92.6 02/27/2023   PLT 329 02/27/2023   NEUTROABS 5.4 02/24/2023    Imaging:  No results found.  Medications: I have reviewed the patient's current medications.  Assessment/Plan: Sigmoid colon cancer, stage IV (pT4a,pN2b,M1c) Colonoscopy 03/24/2019-3 rectal polyps-hyperplastic polyps, distal colon biopsy-at least intramucosal adenocarcinoma, completely obstructing mass in the distal sigmoid colon, could not be traversed, intact mismatch repair protein expression 03/24/2019-CEA 56.1 03/29/2019 CT abdomen/pelvis-circumferential thickening involving the entire mid and distal sigmoid colon to the level of the rectosigmoid junction, solid/cystic mass in the left ovary, bilateral nephrolithiasis Robotic assisted low anterior resection, mesenteric lymphadenectomy, bilateral salpingo-oophorectomy  04/08/2019 Pathology ([facility] review of outside pathology) metastatic adenocarcinoma involving the peritoneum overlying the round ligament, serosa of the urinary bladder, serosa of the right ureter a sacral area, pelvic peritoneum, and left ovary.  Omental biopsy with focal mucin pools with no carcinoma cells identified, right hemidiaphragm biopsy involved by metastatic adenocarcinoma, invasive adenocarcinoma the sigmoid colon, moderately differentiated, T4a, perineural and vascular invasion present, 7/22 lymph nodes, multiple tumor deposits, resection margins negative Negative for PD-L1, low probability of MSI-high, HER-2 negative, negative for BRAF, NRAS and KRAS alterations CTs 05/19/2019-no evidence of metastatic disease, findings suspicious for colitis of the transverse and ascending colon, small amount of ascites in the cul-de-sac, bilateral renal calculi Cycle 1 FOLFIRI 05/24/2019, bevacizumab added with cycle 2 Cycle 4 FOLFIRI/bevacizumab 07/05/2019 Cycle 5 FOLFIRI 07/27/2019, bevacizumab held secondary to hypertension Cycle 6 FOLFIRI 08/10/1999, bevacizumab held secondary to hypertension CTs 08/30/2019-no evidence of recurrent disease, new subsolid right lower lobe nodule felt to be inflammatory, emphysema Cycle 7 FOLFIRI 09/26/2019, bevacizumab remains on hold secondary to hypertension Cycle 8 FOLFIRI 10/17/2019, bevacizumab held secondary to hypertension, Udenyca added for neutropenia Cycle 9 FOLFIRI 11/07/2019, bevacizumab held secondary to hypertension, Udenyca Cycle 10 FOLFIRI 11/28/2019, bevacizumab held, Udenyca Cycle 11 FOLFIRI 12/26/2019, bevacizumab held, Udenyca CTs 01/25/2020-no evidence of recurrent disease, resolution of right lower lobe nodule Maintenance Xeloda beginning 02/06/2020 CTs 04/25/2020- no evidence of metastatic disease, multiple bilateral renal calculi without hydronephrosis Maintenance Xeloda continued CT abdomen/pelvis without contrast 08/10/2020-bilateral staghorn renal  calculi, no evidence of metastatic disease; addendum 08/23/2020-small but increasing omental nodules. Cycle 1 FOLFIRI/panitumumab 09/19/2020 Cycle 2 FOLFIRI/Panitumumab 10/03/2020 Cycle 3 FOLFIRI/Panitumumab 10/17/2020 Cycle 4 FOLFIRI/panitumumab 10/31/2020 Cycle 5 FOLFIRI/Panitumumab 11/14/2020 CTs 11/26/2020-stable omental metastases compared to 10/22/2020, mildly decreased from 08/10/2020.  Left lower  quadrant tiny paracolic gutter implant slightly decreased from CT 08/10/2020.  No new or progressive metastatic disease in the abdomen or pelvis. Cycle 6 FOLFIRI/Panitumumab 11/28/2020, irinotecan dose reduced, treatment schedule adjusted to every 3 weeks going forward Cycle 7 FOLFIRI/panitumumab 12/24/2020 Cycle 8 FOLFIRI/Panitumumab 01/16/2021--treatment held due to hypertension in the infusion area. Cycle 8 FOLFIRI/panitumumab 01/21/2021 Cycle 9 FOLFIRI/panitumumab 02/11/2021 Cycle 10 FOLFIRI/Panitumumab 03/04/2021 Cycle 11 FOLFIRI/Panitumumab 03/25/2021 CT abdomen/pelvis 04/10/2021-stable right omental and left posterior paracolic gutter nodules Cycle 12 FOLFIRI/panitumumab 04/15/2021 Cycle 13 FOLFIRI/panitumumab 05/06/2021 Cycle 14 FOLFIRI/Panitumumab 05/27/2021 Cycle 15 FOLFIRI/Panitumumab 06/17/2021 CT abdomen/pelvis 07/05/2021-slight enlargement of a dominant right omental implant, no new implants Patient requested a treatment break CT abdomen/pelvis 10/04/2021-mild increase in size of multiple omental soft tissue nodules, 4 mm calculus in the distal left ureter adjacent to the ureteral stent Cycle 1 Lonsurf 10/14/2021 10/28/2021 Avastin every 2 weeks Cycle 2 Lonsurf 11/11/2021 Cycle 3 Lonsurf 12/09/2021 Cycle 4 Lonsurf 01/06/2022 Avastin held 01/20/2022 due to proteinuria, 24-hour urine 43 mg CT/pelvis 01/30/2022-possible new peritoneal implant at the transverse colon, no significant change in other omental/peritoneal implants, stable chronic rectal wall thickening, status post removal of double-J left  ureteral stent with no evidence of hydronephrosis or ureteral calculus, nonobstructing bilateral renal calculi Cycle 5 Lonsurf 02/05/2022 or 02/06/2022 Avastin every 2 weeks Cycle 6 Lonsurf 03/03/2022 CTs 04/05/2022-increase in size and number of peritoneal implants compared to January 2024 central liver mass narrowing the anterior branch of the right portal vein, decreased biliary duct dilation, biliary stents in place, stable right cardiophrenic lymph nodes, separate pigtail stent in the lumen of the proximal duodenum Cycle 1 FOLFOX 04/14/2022 CT abdomen/pelvis 04/16/2022-stent within the third portion of the duodenum, stable peritoneal nodules, indwelling biliary stents with mild left biliary duct dilation Cycle 2 FOLFOX 04/28/2022, Emend and prophylactic dexamethasone added Cycle 3 FOLFOX 05/12/2022, Aloxi, Emend, prophylactic dexamethasone, Compazine and lorazepam as needed Chemotherapy held 05/25/2020 due to severe hypertension, evaluated in the emergency department Cycle 4 FOLFOX 06/03/2022 Cycle 5 FOLFOX 06/16/2022, oxaliplatin dose reduced and 5-FU bolus eliminated due to neutropenia CT 06/25/2022-unchanged soft tissue at the hepatic hilum, unchanged, bile duct stent, decreased size of peritoneal and omental nodules and diminished volume of likely fluid in the lower left pelvis Cycle 6 FOLFOX 06/30/2022 Cycle 7 FOLFOX 07/21/2022 CT 07/24/2022 (in the emergency department with nausea, headache, abdominal pain)-no acute pathology.  No significant interval change in omental nodularity. Cycle 8 FOLFOX 08/04/2022, pruritus on the palms at the end of the oxaliplatin infusion, Pepcid and Benadryl given, symptoms resolved, infusion not resumed Cycle 9 FOLFOX 08/18/2022-Pepcid and Benadryl added to premedications, Oxaliplatin diluted in a larger volume and infusion time increased Cycle 10 FOLFOX 09/08/2022 Cycle 11 FOLFOX 09/22/2022 10/06/2022 patient declined treatment CTs 10/15/2022-similar soft tissue fullness in the  hepatic hilum with biliary stents in place, decrease in omental/peritoneal metastases Maintenance capecitabine 10/26/2022 CT abdomen/pelvis 11/19/2022-mild increase in tumor studding at the left paracolic gutter and omentum, circumferential wall thickening of the rectum, biliary stents in place with minimal increase in intrahepatic biliary dilation, nonobstructive renal calculi Colonoscopy 11/23/2022-anastomosis at 15 cm from the anal verge-patent, 2 cm polypoid tissue at the anastomosis biopsied, invasive adenocarcinoma moderately differentiated, preserved expression of major MMR proteins CTs 12/15/2022: Possible developing nodule in the lateral left liver, increased omental nodularity, persistent wall thickening in the upper rectum CT abdomen/pelvis 12/24/2022: Increased thickened fold in the transverse colon-infectious/inflammatory, 2.1 cm lateral left liver lesion, potential new 5 mm segment 5 lesion, stable biliary stents with intrahepatic biliary dilation,  central biliary wall thickening near the hepatic hilum, multiple peritoneal/omental implants unchanged from 12/15/2022, but likely larger than 11/19/2022 Cycle 1 irinotecan/Panitumumab 01/06/2023 Cycle 2 irinotecan/Panitumumab 01/20/2023 CTs 01/24/2023-slight increase in omental nodularity. Cycle 3 irinotecan/Panitumumab 02/03/2023 Cycle 4 irinotecan/panitumumab 02/17/2023 CTs abdomen/pelvis 02/24/2023-subtly hypoattenuating lesion within the peripheral aspect of hepatic segment 2 redemonstrated.  Additional scattered subcentimeter low-density lesions within the liver too small to characterize.  Prominent intrahepatic biliary dilatation with biliary stents in place.  Similar degree of nodularity along the omentum and left paracolic gutter.  No ascites.   Hypertension G4 P3, twins Kidney stones-bilateral staghorn renal calculi on CT 08/10/2020 Infected Port-A-Cath 09/12/2019-placed on Augmentin, referred for Port-A-Cath removal; Port-A-Cath removed  09/14/2019; PICC line placed 09/14/2019; culture staph aureus.  Course of Septra completed. Neutropenia secondary to chemotherapy-Udenyca added with cycle 8 FOLFIRI Right nephrostomy tube 09/12/2020; stent placed 10/09/2020, percutaneous nephrostolithotomy treatment of right-sided kidney stones 10/09/2020, malpositioned right ureter stent replacement 10/23/2020, right ureter stent removed 10/29/2020 Percutaneous left nephrostolithotomy 10/01/2021-no renal stone identified, left ureter stent left in place Cystoscopy 10/14/2021-stent removed 8.  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  2 moderate biliary strictures found in the hepatic duct system, malignant appearing.  Left and right hepatic duct and all intrahepatic branches were moderately dilated secondary to the strictures.  The strictures were dilated.  Plastic stent placed into the left hepatic duct and right hepatic duct. 9.  Admission 04/16/2022 with nausea/vomiting and abdominal pain, felt to be acute toxicity related to chemotherapy, discharged home 04/18/2022 10.  Admission 11/19/2022 with nausea/vomiting and abdominal pain-improved 11.  Admission 12/15/2022 with fever and right submandibular swelling/tenderness CT neck 12/15/2022: 2 mm distal right submandibular duct calculus with mild inflammatory change adjacent to the right submandibular gland 12.  Admission  12/24/2022 with a fever nausea/vomiting, diarrhea, Klebsiella bacteremia completed course of Augmentin    Disposition: Ms. Shareef appears stable.  She underwent biliary stent exchange during the recent hospitalization.  Performance status has improved significantly.  She has no longer having nausea or abdominal pain.  She is tolerating a regular diet.  She requested to hold treatment today and reschedule to next week.  She would like to receive treatment on a 3-week schedule thereafter.  She will return for lab and treatment on 03/10/2023.  We will see her in follow-up prior to treatment on 03/31/2023.  We are available to see her sooner if needed.    Lonna Cobb ANP/GNP-BC   03/03/2023  9:42 AM

## 2023-03-04 ENCOUNTER — Telehealth: Payer: Self-pay

## 2023-03-04 ENCOUNTER — Encounter: Payer: Self-pay | Admitting: Gastroenterology

## 2023-03-04 NOTE — Telephone Encounter (Signed)
-----   Message from Lonna Cobb sent at 03/04/2023  9:19 AM EST ----- She does not need to increase the dose if she has not been taking it consistently.  She can continue 20 meq twice daily--needs to take consistently. ----- Message ----- From: Dimitri Ped, LPN Sent: 1/61/0960   8:41 AM EST To: Rana Snare, NP  She have not being taking her K consistently, and I advise her to increase her dose. ----- Message ----- From: Rana Snare, NP Sent: 03/04/2023   8:18 AM EST To: [facility]  If she has been taking K consistently have her increase from 20 meq BID to 30 meq BID.

## 2023-03-05 ENCOUNTER — Other Ambulatory Visit: Payer: Self-pay | Admitting: Oncology

## 2023-03-05 ENCOUNTER — Telehealth: Payer: Self-pay

## 2023-03-05 ENCOUNTER — Other Ambulatory Visit (HOSPITAL_COMMUNITY): Payer: Self-pay

## 2023-03-05 ENCOUNTER — Other Ambulatory Visit: Payer: Self-pay | Admitting: Nurse Practitioner

## 2023-03-05 DIAGNOSIS — C19 Malignant neoplasm of rectosigmoid junction: Secondary | ICD-10-CM

## 2023-03-05 NOTE — Telephone Encounter (Addendum)
The patient reported that the 2mg  Dilaudid is not providing adequate relief, as she has found it necessary to take two tablets to experience any benefit. She is requesting an adjustment of her medication from 2mg  to 4mg  so that she would only need to take one tablet for effective relief. The patient reports experiencing pain localized in the lower left quadrant of her abdomen. She describes the sensation as pressure in that area. This discomfort is new and began the previous evening. Misty Stanley has indicated that she will visit the infusion room during the patient's dressing change to see her. The patient is informed about Lisa's coming in the infusion room, and I have documented this in the appointment notes.

## 2023-03-06 ENCOUNTER — Inpatient Hospital Stay: Payer: 59

## 2023-03-06 ENCOUNTER — Telehealth: Payer: Self-pay

## 2023-03-06 NOTE — Telephone Encounter (Signed)
Patient called to cancel PICC line dressing change for 03/06/23 and stated that she would just get it done when she comes for her appointment on 03/10/23.

## 2023-03-09 ENCOUNTER — Inpatient Hospital Stay (HOSPITAL_COMMUNITY)
Admission: EM | Admit: 2023-03-09 | Discharge: 2023-03-14 | DRG: 947 | Disposition: A | Discharge status: Home / Self Care | Payer: 59 | Attending: Internal Medicine | Admitting: Internal Medicine

## 2023-03-09 ENCOUNTER — Emergency Department (HOSPITAL_COMMUNITY): Payer: 59

## 2023-03-09 ENCOUNTER — Other Ambulatory Visit: Payer: Self-pay

## 2023-03-09 ENCOUNTER — Encounter (HOSPITAL_COMMUNITY): Payer: Self-pay | Admitting: Radiology

## 2023-03-09 DIAGNOSIS — Z7189 Other specified counseling: Secondary | ICD-10-CM | POA: Diagnosis not present

## 2023-03-09 DIAGNOSIS — G8929 Other chronic pain: Secondary | ICD-10-CM | POA: Diagnosis not present

## 2023-03-09 DIAGNOSIS — C800 Disseminated malignant neoplasm, unspecified: Secondary | ICD-10-CM | POA: Diagnosis not present

## 2023-03-09 DIAGNOSIS — C20 Malignant neoplasm of rectum: Secondary | ICD-10-CM | POA: Diagnosis present

## 2023-03-09 DIAGNOSIS — C787 Secondary malignant neoplasm of liver and intrahepatic bile duct: Secondary | ICD-10-CM | POA: Diagnosis present

## 2023-03-09 DIAGNOSIS — R4589 Other symptoms and signs involving emotional state: Secondary | ICD-10-CM | POA: Diagnosis not present

## 2023-03-09 DIAGNOSIS — Z8249 Family history of ischemic heart disease and other diseases of the circulatory system: Secondary | ICD-10-CM | POA: Diagnosis not present

## 2023-03-09 DIAGNOSIS — R112 Nausea with vomiting, unspecified: Secondary | ICD-10-CM | POA: Diagnosis present

## 2023-03-09 DIAGNOSIS — Z87442 Personal history of urinary calculi: Secondary | ICD-10-CM

## 2023-03-09 DIAGNOSIS — Z79899 Other long term (current) drug therapy: Secondary | ICD-10-CM | POA: Diagnosis not present

## 2023-03-09 DIAGNOSIS — Z515 Encounter for palliative care: Secondary | ICD-10-CM

## 2023-03-09 DIAGNOSIS — Z87891 Personal history of nicotine dependence: Secondary | ICD-10-CM

## 2023-03-09 DIAGNOSIS — Z789 Other specified health status: Secondary | ICD-10-CM | POA: Diagnosis not present

## 2023-03-09 DIAGNOSIS — N261 Atrophy of kidney (terminal): Secondary | ICD-10-CM | POA: Diagnosis present

## 2023-03-09 DIAGNOSIS — D638 Anemia in other chronic diseases classified elsewhere: Secondary | ICD-10-CM | POA: Diagnosis present

## 2023-03-09 DIAGNOSIS — D72829 Elevated white blood cell count, unspecified: Secondary | ICD-10-CM

## 2023-03-09 DIAGNOSIS — K5900 Constipation, unspecified: Secondary | ICD-10-CM | POA: Diagnosis present

## 2023-03-09 DIAGNOSIS — I81 Portal vein thrombosis: Secondary | ICD-10-CM | POA: Diagnosis present

## 2023-03-09 DIAGNOSIS — C786 Secondary malignant neoplasm of retroperitoneum and peritoneum: Secondary | ICD-10-CM | POA: Diagnosis present

## 2023-03-09 DIAGNOSIS — Z85528 Personal history of other malignant neoplasm of kidney: Secondary | ICD-10-CM

## 2023-03-09 DIAGNOSIS — I10 Essential (primary) hypertension: Secondary | ICD-10-CM | POA: Diagnosis present

## 2023-03-09 DIAGNOSIS — G893 Neoplasm related pain (acute) (chronic): Secondary | ICD-10-CM | POA: Diagnosis present

## 2023-03-09 DIAGNOSIS — Z85038 Personal history of other malignant neoplasm of large intestine: Secondary | ICD-10-CM

## 2023-03-09 DIAGNOSIS — K3189 Other diseases of stomach and duodenum: Secondary | ICD-10-CM | POA: Diagnosis present

## 2023-03-09 DIAGNOSIS — Z1152 Encounter for screening for COVID-19: Secondary | ICD-10-CM

## 2023-03-09 DIAGNOSIS — R1013 Epigastric pain: Secondary | ICD-10-CM | POA: Diagnosis present

## 2023-03-09 DIAGNOSIS — Z823 Family history of stroke: Secondary | ICD-10-CM

## 2023-03-09 DIAGNOSIS — Z9682 Presence of neurostimulator: Secondary | ICD-10-CM

## 2023-03-09 DIAGNOSIS — E876 Hypokalemia: Secondary | ICD-10-CM | POA: Diagnosis present

## 2023-03-09 DIAGNOSIS — C187 Malignant neoplasm of sigmoid colon: Secondary | ICD-10-CM | POA: Diagnosis present

## 2023-03-09 DIAGNOSIS — K810 Acute cholecystitis: Principal | ICD-10-CM | POA: Diagnosis present

## 2023-03-09 DIAGNOSIS — R509 Fever, unspecified: Secondary | ICD-10-CM | POA: Diagnosis present

## 2023-03-09 DIAGNOSIS — Z888 Allergy status to other drugs, medicaments and biological substances status: Secondary | ICD-10-CM

## 2023-03-09 DIAGNOSIS — R1011 Right upper quadrant pain: Secondary | ICD-10-CM

## 2023-03-09 DIAGNOSIS — N200 Calculus of kidney: Secondary | ICD-10-CM | POA: Diagnosis present

## 2023-03-09 DIAGNOSIS — C189 Malignant neoplasm of colon, unspecified: Secondary | ICD-10-CM | POA: Diagnosis present

## 2023-03-09 DIAGNOSIS — R9431 Abnormal electrocardiogram [ECG] [EKG]: Secondary | ICD-10-CM | POA: Diagnosis present

## 2023-03-09 DIAGNOSIS — K831 Obstruction of bile duct: Secondary | ICD-10-CM | POA: Diagnosis present

## 2023-03-09 DIAGNOSIS — R6510 Systemic inflammatory response syndrome (SIRS) of non-infectious origin without acute organ dysfunction: Secondary | ICD-10-CM | POA: Diagnosis present

## 2023-03-09 DIAGNOSIS — K820 Obstruction of gallbladder: Secondary | ICD-10-CM | POA: Diagnosis present

## 2023-03-09 DIAGNOSIS — Z833 Family history of diabetes mellitus: Secondary | ICD-10-CM

## 2023-03-09 DIAGNOSIS — R935 Abnormal findings on diagnostic imaging of other abdominal regions, including retroperitoneum: Secondary | ICD-10-CM | POA: Diagnosis not present

## 2023-03-09 DIAGNOSIS — R1084 Generalized abdominal pain: Principal | ICD-10-CM

## 2023-03-09 LAB — CBC WITH DIFFERENTIAL/PLATELET
Abs Immature Granulocytes: 0.08 10*3/uL — ABNORMAL HIGH (ref 0.00–0.07)
Basophils Absolute: 0.1 10*3/uL (ref 0.0–0.1)
Basophils Relative: 0 %
Eosinophils Absolute: 0.1 10*3/uL (ref 0.0–0.5)
Eosinophils Relative: 0 %
HCT: 37.7 % (ref 36.0–46.0)
Hemoglobin: 12.1 g/dL (ref 12.0–15.0)
Immature Granulocytes: 1 %
Lymphocytes Relative: 12 %
Lymphs Abs: 2.1 10*3/uL (ref 0.7–4.0)
MCH: 30.3 pg (ref 26.0–34.0)
MCHC: 32.1 g/dL (ref 30.0–36.0)
MCV: 94.3 fL (ref 80.0–100.0)
Monocytes Absolute: 1.3 10*3/uL — ABNORMAL HIGH (ref 0.1–1.0)
Monocytes Relative: 8 %
Neutro Abs: 12.9 10*3/uL — ABNORMAL HIGH (ref 1.7–7.7)
Neutrophils Relative %: 79 %
Platelets: 387 10*3/uL (ref 150–400)
RBC: 4 MIL/uL (ref 3.87–5.11)
RDW: 16.6 % — ABNORMAL HIGH (ref 11.5–15.5)
WBC: 16.5 10*3/uL — ABNORMAL HIGH (ref 4.0–10.5)
nRBC: 0 % (ref 0.0–0.2)

## 2023-03-09 LAB — URINALYSIS, W/ REFLEX TO CULTURE (INFECTION SUSPECTED)
Bilirubin Urine: NEGATIVE
Glucose, UA: NEGATIVE mg/dL
Ketones, ur: NEGATIVE mg/dL
Leukocytes,Ua: NEGATIVE
Nitrite: NEGATIVE
Protein, ur: NEGATIVE mg/dL
pH: 5 (ref 5.0–8.0)

## 2023-03-09 LAB — I-STAT CHEM 8, ED
BUN: 4 mg/dL — ABNORMAL LOW (ref 6–20)
Calcium, Ion: 1.15 mmol/L (ref 1.15–1.40)
Chloride: 94 mmol/L — ABNORMAL LOW (ref 98–111)
Creatinine, Ser: 0.5 mg/dL (ref 0.44–1.00)
Glucose, Bld: 111 mg/dL — ABNORMAL HIGH (ref 70–99)
HCT: 39 % (ref 36.0–46.0)
Hemoglobin: 13.3 g/dL (ref 12.0–15.0)
Potassium: 2.8 mmol/L — ABNORMAL LOW (ref 3.5–5.1)
Sodium: 136 mmol/L (ref 135–145)
TCO2: 29 mmol/L (ref 22–32)

## 2023-03-09 LAB — COMPREHENSIVE METABOLIC PANEL
ALT: 32 U/L (ref 0–44)
AST: 25 U/L (ref 15–41)
Albumin: 3.6 g/dL (ref 3.5–5.0)
Alkaline Phosphatase: 216 U/L — ABNORMAL HIGH (ref 38–126)
Anion gap: 13 (ref 5–15)
BUN: 6 mg/dL (ref 6–20)
CO2: 28 mmol/L (ref 22–32)
Calcium: 9.4 mg/dL (ref 8.9–10.3)
Chloride: 96 mmol/L — ABNORMAL LOW (ref 98–111)
Creatinine, Ser: 0.43 mg/dL — ABNORMAL LOW (ref 0.44–1.00)
GFR, Estimated: >60 mL/min (ref >60)
Glucose, Bld: 110 mg/dL — ABNORMAL HIGH (ref 70–99)
Potassium: 2.7 mmol/L — CL (ref 3.5–5.1)
Sodium: 137 mmol/L (ref 135–145)
Total Bilirubin: 0.6 mg/dL (ref 0.0–1.2)
Total Protein: 8 g/dL (ref 6.5–8.1)

## 2023-03-09 LAB — APTT: aPTT: 33 s (ref 24–36)

## 2023-03-09 LAB — I-STAT CG4 LACTIC ACID, ED: Lactic Acid, Venous: 1 mmol/L (ref 0.5–1.9)

## 2023-03-09 LAB — RESP PANEL BY RT-PCR (RSV, FLU A&B, COVID)  RVPGX2
Influenza A by PCR: NEGATIVE
Influenza B by PCR: NEGATIVE
Resp Syncytial Virus by PCR: NEGATIVE
SARS Coronavirus 2 by RT PCR: NEGATIVE

## 2023-03-09 LAB — PHOSPHORUS: Phosphorus: 4.1 mg/dL (ref 2.5–4.6)

## 2023-03-09 LAB — MAGNESIUM: Magnesium: 1.7 mg/dL (ref 1.7–2.4)

## 2023-03-09 LAB — PROTIME-INR
INR: 1.1 (ref 0.8–1.2)
Prothrombin Time: 13.9 s (ref 11.4–15.2)

## 2023-03-09 MED ORDER — ACETAMINOPHEN 325 MG PO TABS
650.0000 mg | ORAL_TABLET | Freq: Four times a day (QID) | ORAL | Status: DC | PRN
  Administered 2023-03-09: 650 mg via ORAL
  Filled 2023-03-09: qty 2

## 2023-03-09 MED ORDER — HYDROMORPHONE HCL 1 MG/ML IJ SOLN
1.0000 mg | INTRAMUSCULAR | Status: DC | PRN
  Administered 2023-03-09 – 2023-03-10 (×7): 1 mg via INTRAVENOUS
  Filled 2023-03-09 (×7): qty 1

## 2023-03-09 MED ORDER — SODIUM CHLORIDE 0.9 % IV SOLN
2.0000 g | Freq: Three times a day (TID) | INTRAVENOUS | Status: DC
  Administered 2023-03-09 – 2023-03-14 (×14): 2 g via INTRAVENOUS
  Filled 2023-03-09 (×14): qty 12.5

## 2023-03-09 MED ORDER — IOHEXOL 300 MG/ML  SOLN
100.0000 mL | Freq: Once | INTRAMUSCULAR | Status: AC | PRN
  Administered 2023-03-09: 100 mL via INTRAVENOUS

## 2023-03-09 MED ORDER — CHLORHEXIDINE GLUCONATE CLOTH 2 % EX PADS
6.0000 | MEDICATED_PAD | Freq: Every day | CUTANEOUS | Status: DC
  Administered 2023-03-10 – 2023-03-14 (×5): 6 via TOPICAL

## 2023-03-09 MED ORDER — KETOROLAC TROMETHAMINE 30 MG/ML IJ SOLN
30.0000 mg | Freq: Once | INTRAMUSCULAR | Status: AC
  Administered 2023-03-09: 30 mg via INTRAVENOUS
  Filled 2023-03-09: qty 1

## 2023-03-09 MED ORDER — HYDROMORPHONE HCL 2 MG/ML IJ SOLN
2.0000 mg | Freq: Once | INTRAMUSCULAR | Status: AC
  Administered 2023-03-09: 2 mg via INTRAVENOUS
  Filled 2023-03-09: qty 1

## 2023-03-09 MED ORDER — HYDROMORPHONE HCL 1 MG/ML IJ SOLN
1.0000 mg | Freq: Once | INTRAMUSCULAR | Status: AC
  Administered 2023-03-09: 1 mg via INTRAVENOUS
  Filled 2023-03-09: qty 1

## 2023-03-09 MED ORDER — POTASSIUM CHLORIDE IN NACL 40-0.9 MEQ/L-% IV SOLN
INTRAVENOUS | Status: AC
  Filled 2023-03-09: qty 1000

## 2023-03-09 MED ORDER — POTASSIUM CHLORIDE 10 MEQ/100ML IV SOLN
10.0000 meq | INTRAVENOUS | Status: AC
  Administered 2023-03-09 (×4): 10 meq via INTRAVENOUS
  Filled 2023-03-09 (×4): qty 100

## 2023-03-09 MED ORDER — ONDANSETRON HCL 4 MG/2ML IJ SOLN
4.0000 mg | Freq: Four times a day (QID) | INTRAMUSCULAR | Status: DC | PRN
  Administered 2023-03-09 – 2023-03-11 (×4): 4 mg via INTRAVENOUS
  Filled 2023-03-09 (×4): qty 2

## 2023-03-09 MED ORDER — LACTATED RINGERS IV SOLN
150.0000 mL/h | INTRAVENOUS | Status: AC
  Administered 2023-03-09 (×2): 150 mL/h via INTRAVENOUS

## 2023-03-09 MED ORDER — MAGNESIUM SULFATE 2 GM/50ML IV SOLN
2.0000 g | Freq: Once | INTRAVENOUS | Status: AC
  Administered 2023-03-09: 2 g via INTRAVENOUS
  Filled 2023-03-09: qty 50

## 2023-03-09 MED ORDER — SODIUM CHLORIDE 0.9 % IV BOLUS
1000.0000 mL | Freq: Once | INTRAVENOUS | Status: AC
  Administered 2023-03-09: 1000 mL via INTRAVENOUS

## 2023-03-09 MED ORDER — POTASSIUM CHLORIDE 10 MEQ/100ML IV SOLN
10.0000 meq | Freq: Once | INTRAVENOUS | Status: AC
  Administered 2023-03-09: 10 meq via INTRAVENOUS
  Filled 2023-03-09: qty 100

## 2023-03-09 MED ORDER — LACTATED RINGERS IV BOLUS
1000.0000 mL | Freq: Once | INTRAVENOUS | Status: AC
  Administered 2023-03-09: 1000 mL via INTRAVENOUS

## 2023-03-09 MED ORDER — SODIUM CHLORIDE 0.9% FLUSH
10.0000 mL | INTRAVENOUS | Status: DC | PRN
  Administered 2023-03-14: 10 mL

## 2023-03-09 MED ORDER — ENOXAPARIN SODIUM 40 MG/0.4ML IJ SOSY
40.0000 mg | PREFILLED_SYRINGE | INTRAMUSCULAR | Status: DC
  Administered 2023-03-09: 40 mg via SUBCUTANEOUS
  Filled 2023-03-09: qty 0.4

## 2023-03-09 MED ORDER — PROCHLORPERAZINE EDISYLATE 10 MG/2ML IJ SOLN
10.0000 mg | Freq: Once | INTRAMUSCULAR | Status: AC
  Administered 2023-03-09: 10 mg via INTRAVENOUS
  Filled 2023-03-09: qty 2

## 2023-03-09 MED ORDER — SODIUM CHLORIDE 0.9% FLUSH
10.0000 mL | Freq: Two times a day (BID) | INTRAVENOUS | Status: DC
Start: 2023-03-09 — End: 2023-03-14
  Administered 2023-03-10: 10 mL
  Administered 2023-03-11: 30 mL
  Administered 2023-03-12 – 2023-03-13 (×4): 10 mL

## 2023-03-09 MED ORDER — METRONIDAZOLE 500 MG/100ML IV SOLN
500.0000 mg | Freq: Two times a day (BID) | INTRAVENOUS | Status: DC
  Administered 2023-03-09 – 2023-03-14 (×9): 500 mg via INTRAVENOUS
  Filled 2023-03-09 (×9): qty 100

## 2023-03-09 MED ORDER — ACETAMINOPHEN 650 MG RE SUPP: 650.0000 mg | Freq: Four times a day (QID) | RECTAL | Status: DC | PRN

## 2023-03-09 MED ORDER — ONDANSETRON HCL 4 MG/2ML IJ SOLN
4.0000 mg | Freq: Once | INTRAMUSCULAR | Status: AC
  Administered 2023-03-09: 4 mg via INTRAVENOUS
  Filled 2023-03-09: qty 2

## 2023-03-09 MED ORDER — ONDANSETRON HCL 4 MG PO TABS: 4.0000 mg | ORAL_TABLET | Freq: Four times a day (QID) | ORAL | Status: DC | PRN

## 2023-03-09 MED ORDER — PIPERACILLIN-TAZOBACTAM 3.375 G IVPB 30 MIN
3.3750 g | Freq: Once | INTRAVENOUS | Status: AC
  Administered 2023-03-09: 3.375 g via INTRAVENOUS
  Filled 2023-03-09: qty 50

## 2023-03-09 NOTE — ED Triage Notes (Signed)
 Pt BIBA from home for lower right abd pain. Hx colon cancer. Took home 2mg  dilaudid last night but pain has been gradually getting worse. Does not radiate. Last chemo 3 weeks ago. 1 episode emesis.  154/84 HR 134 100% RA Temp 99.1 Cbg 166

## 2023-03-09 NOTE — H&P (Signed)
 History and Physical    Patient: April [name] ZOX:[id number] DOB: [birth year] DOA: 03/09/2023 DOS: the patient was seen and examined on 03/09/2023 PCP: Tollie Eth, NP  Patient coming from: Home  Chief Complaint:  Chief Complaint  Patient presents with   Abdominal Pain   HPI: April [name] is a 48 y.o. 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 Meridee Score 12 days ago who is returning to the emergency department due to severe RUQ pain associated with nausea and 1 episode of emesis.  She was found to be febrile on arrival to the emergency department.  No rhinorrhea, sore throat, wheezing or hemoptysis.  No chest pain, palpitations, diaphoresis, PND, orthopnea or pitting edema of the lower extremities.  No diarrhea, constipation, melena or hematochezia.  Positive right flank pain, no dysuria, frequency or hematuria.  No polyuria, polydipsia, polyphagia or blurred vision.   Lab work: CBC showed a white count of 16.5, hemoglobin 12.1 g/dL platelets 811.  Normal PT, INR and PTT.  Negative coronavirus, influenza and RSV PCR.  CMP showed a potassium of 2.7 mmol/L, chloride 96 mmol/L, creatinine 0.43 mg/dL and alkaline phosphatase 216 units/L.  BUN, the rest of the LFTs and the rest of the electrolytes are normal.  Imaging: Portable 1 view chest radiograph showing no acute cardiopulmonary abnormality.  CT abdomen and/pelvis showing rectal carcinoma with peritoneal carcinomatosis.  Trace ascites.  Thickening of the liver hilum and slightly increased from previous.  Indwelling biliary stents with some probing biliary ductal  dilatation compared to the prior examination.  There is more sturgeon appearance of the liver with more periportal edema and areas of potential developing portal vein peripheral branch thrombosis, particularly along the inferior aspect of the liver.  Bilateral nonobstructing renal stones.  Atrophy of the right kidney.   ED course: Initial vital signs were temperature 100.2 F, but subsequently rose to 101.3 F, pulse 123, respirations 22, BP 122/108 mmHg O2 sat 99% on room air.  The patient received hydromorphone 1 mg IVP x 2, LR 1000 mL bolus, ondansetron 4 mg IVP, KCl 10 mEq IVPB and Zosyn 3.375 g IVPB.  I added normal saline 1000 mL liter bolus, prochlorperazine 10 mg IVP, ketorolac 30 mg IVP and hydromorphone 2 mg IVP x 1.  Hydromorphone 2 mg IVP x 1.  The case was discussed with primary oncologist by Dr. Andria Meuse.  No anticoagulation recommended.  Review of Systems: As mentioned in the history of present illness. All other systems reviewed and are negative. Past Medical History:  Diagnosis Date   Ascending aortic aneurysm (HCC) 12/16/2022   Benign essential hypertension 10/06/2017   Last Assessment & Plan:  Formatting of this note might be different from the original. Patient's blood pressure noted to be elevated at today's visit 188/98.  Patient will be provided with antihypertensives in the treatment center if needed in order to meet her Avastin parameters.  Patient recommended to follow up with her primary care physician regarding medication management.  Patient is not cur   Cancer [facility])    Change in bowel habits 06/28/2022   Colon cancer (HCC)    History of colon cancer 06/28/2022   Hypertension    Prolonged QT interval 01/07/2018   Renal calculi    bilateral  Sepsis due to undetermined organism (HCC) 12/24/2022   Sialoadenitis of submandibular gland 12/16/2022   Past Surgical History:  Procedure Laterality Date   BALLOON DILATION N/A 02/26/2023   Procedure: BALLOON DILATION;   Surgeon: Meridee Score Netty Starring., MD;  Location: [facility] ENDOSCOPY;  Service: Gastroenterology;  Laterality: N/A;   BILIARY BRUSHING  03/24/2022   Procedure: BILIARY BRUSHING;  Surgeon: Meridee Score Netty Starring., MD;  Location: [facility] ENDOSCOPY;  Service: Gastroenterology;;   BILIARY DILATION  03/24/2022   Procedure: BILIARY DILATION;  Surgeon: Lemar Lofty., MD;  Location: [facility] ENDOSCOPY;  Service: Gastroenterology;;   BILIARY STENT PLACEMENT  03/24/2022   Procedure: BILIARY STENT PLACEMENT;  Surgeon: Lemar Lofty., MD;  Location: [facility] ENDOSCOPY;  Service: Gastroenterology;;   BILIARY STENT PLACEMENT N/A 02/26/2023   Procedure: BILIARY STENT PLACEMENT;  Surgeon: Lemar Lofty., MD;  Location: [facility] ENDOSCOPY;  Service: Gastroenterology;  Laterality: N/A;   BIOPSY  03/24/2022   Procedure: BIOPSY;  Surgeon: Meridee Score Netty Starring., MD;  Location: [facility] ENDOSCOPY;  Service: Gastroenterology;;   BIOPSY  11/23/2022   Procedure: BIOPSY;  Surgeon: Hilarie Fredrickson, MD;  Location: [facility] ENDOSCOPY;  Service: Gastroenterology;;   BIOPSY  02/26/2023   Procedure: BIOPSY;  Surgeon: Lemar Lofty., MD;  Location: [facility] ENDOSCOPY;  Service: Gastroenterology;;   CESAREAN SECTION     x2   COLONOSCOPY N/A 11/23/2022   Procedure: COLONOSCOPY;  Surgeon: Hilarie Fredrickson, MD;  Location: [facility] ENDOSCOPY;  Service: Gastroenterology;  Laterality: N/A;   CYSTOSCOPY WITH RETROGRADE PYELOGRAM, URETEROSCOPY AND STENT PLACEMENT Right 10/22/2020   Procedure: CYSTOSCOPY WITH RETROGRADE PYELOGRAM, URETEROSCOPY AND STENT PLACEMENT;  Surgeon: Noel Christmas, MD;  Location: [facility] ORS;  Service: Urology;  Laterality: Right;   ENDOSCOPIC RETROGRADE CHOLANGIOPANCREATOGRAPHY (ERCP) WITH PROPOFOL N/A 02/26/2023   Procedure: ENDOSCOPIC RETROGRADE CHOLANGIOPANCREATOGRAPHY (ERCP) WITH PROPOFOL;  Surgeon: Meridee Score Netty Starring., MD;  Location: [facility] ENDOSCOPY;  Service: Gastroenterology;  Laterality: N/A;   ERCP N/A 03/24/2022   Procedure:  ENDOSCOPIC RETROGRADE CHOLANGIOPANCREATOGRAPHY (ERCP);  Surgeon: Lemar Lofty., MD;  Location: [facility] ENDOSCOPY;  Service: Gastroenterology;  Laterality: N/A;   ESOPHAGOGASTRODUODENOSCOPY (EGD) WITH PROPOFOL N/A 02/26/2023   Procedure: ESOPHAGOGASTRODUODENOSCOPY (EGD) WITH PROPOFOL;  Surgeon: Meridee Score Netty Starring., MD;  Location: [facility] ENDOSCOPY;  Service: Gastroenterology;  Laterality: N/A;  patient needs EGD in addition to ERCP   IR PATIENT EVAL TECH 0-60 MINS  12/15/2022   IR RADIOLOGIST EVAL & MGMT  09/16/2019   IR RADIOLOGIST EVAL & MGMT  09/23/2019   IR RADIOLOGIST EVAL & MGMT  09/20/2019   IR RADIOLOGIST EVAL & MGMT  09/30/2019   IR REMOVAL TUN ACCESS W/ PORT W/O FL MOD SED  09/14/2019   IR VENO/EXT/UNI RIGHT  04/11/2022   PANCREATIC STENT PLACEMENT  03/24/2022   Procedure: PANCREATIC STENT PLACEMENT;  Surgeon: Lemar Lofty., MD;  Location: [facility] ENDOSCOPY;  Service: Gastroenterology;;   REMOVAL OF STONES  03/24/2022   Procedure: REMOVAL OF STONES;  Surgeon: Lemar Lofty., MD;  Location: [facility] ENDOSCOPY;  Service: Gastroenterology;;   REMOVAL OF STONES  02/26/2023   Procedure: REMOVAL OF Sludge;  Surgeon: Lemar Lofty., MD;  Location: [facility] ENDOSCOPY;  Service: Gastroenterology;;   Dennison Mascot  03/24/2022   Procedure: Dennison Mascot;  Surgeon: Lemar Lofty., MD;  Location: [facility] ENDOSCOPY;  Service: Gastroenterology;;   Francine Graven REMOVAL  02/26/2023   Procedure: STENT REMOVAL;  Surgeon: Lemar Lofty., MD;  Location: [facility] ENDOSCOPY;  Service: Gastroenterology;;   URETERAL STENT PLACEMENT     Social History:  reports  that she has quit smoking. Her smoking use included cigarettes. She has never used smokeless tobacco. She reports that she does not currently use alcohol. She reports that she does not currently use drugs after having used the following drugs: Marijuana.  Allergies  Allergen Reactions   Lisinopril Swelling and Other (See Comments)    Lip swelling  and Angioedema   Irinotecan Other (See Comments)    Stomach cramping Patient given 0.5 mL atropine IV and was able to continue infusion.  See Progress note on 10/03/20.    Leucovorin Calcium Itching    Patient reported itchy palms towards end of transfusion, see progress note 08/04/22.    Oxaliplatin Itching    Patient reported itchy palms towards end of transfusion, see progress note on 08/04/22.    Family History  Problem Relation Age of Onset   Hypertension Mother    Diabetes Mellitus II Father    Stroke Father    Ulcers Sister    Colon cancer Neg Hx    Esophageal cancer Neg Hx    Inflammatory bowel disease Neg Hx    Liver disease Neg Hx    Pancreatic cancer Neg Hx    Rectal cancer Neg Hx    Stomach cancer Neg Hx     Prior to Admission medications   Medication Sig Start Date End Date Taking? Authorizing Provider  amLODipine (NORVASC) 10 MG tablet Take 1 tablet (10 mg total) by mouth daily. 11/24/22   Rodolph Bong, MD  dexamethasone (DECADRON) 4 MG tablet Take 1 tablet by mouth twice a day for 3 days beginning day 2 of chemo. 02/03/23   Rana Snare, NP  dicyclomine (BENTYL) 20 MG tablet Take 1 tablet (20 mg total) by mouth 3 (three) times daily as needed for spasms (rectal/AB pain). 02/17/23   Ladene Artist, MD  HYDROmorphone (DILAUDID) 2 MG tablet Take 1 tablet (2 mg total) by mouth every 6 (six) hours as needed for severe pain (pain score 7-10). 02/27/23   Meredeth Ide, MD  lactulose (CHRONULAC) 10 GM/15ML solution Take 15-30 mLs by mouth daily as needed for mild constipation.    [provider]  magic mouthwash (nystatin, diphenhydrAMINE, alum & mag hydroxide) suspension mixture Swish and Spit 5 mLs by mouth 4 (four) times daily as needed. Patient not taking: Reported on 02/17/2023 02/03/23   Rana Snare, NP  metoprolol tartrate (LOPRESSOR) 50 MG tablet Take 1 tablet (50 mg total) by mouth 2 (two) times daily. 11/24/22   Rodolph Bong, MD  ondansetron  (ZOFRAN) 8 MG tablet Take 1 tablet (8 mg total) by mouth every 8 (eight) hours as needed for nausea or vomiting. Can start taking 72 hours after chemotherapy 02/03/23   Rana Snare, NP  pantoprazole (PROTONIX) 40 MG tablet Take 1 tablet (40 mg total) by mouth daily before breakfast. 02/27/23   Meredeth Ide, MD  polyethylene glycol (MIRALAX / GLYCOLAX) 17 g packet Take 17 g by mouth daily as needed. 02/27/23   Meredeth Ide, MD  potassium chloride (MICRO-K) 10 MEQ CR capsule Take 2 capsules (20 mEq total) by mouth 2 (two) times daily. 11/24/22   Rodolph Bong, MD  prochlorperazine (COMPAZINE) 10 MG tablet Take 1 tablet (10 mg total) by mouth every 6 (six) hours as needed for nausea or vomiting. 02/03/23   Rana Snare, NP    Physical Exam: Vitals:   03/09/23 0858 03/09/23 1100 03/09/23 1101  BP: (!) 122/108 (!) 134/102  Pulse: (!) 123 (!) 118   Resp: (!) 22 (!) 23   Temp: 100.2 F (37.9 C)  99.2 F (37.3 C)  TempSrc: Oral  Oral  SpO2: 99% 98%    Physical Exam Vitals and nursing note reviewed.  Constitutional:      General: She is awake. She is not in acute distress.    Appearance: She is normal weight. She is ill-appearing.  HENT:     Head: Normocephalic.     Nose: No rhinorrhea.     Mouth/Throat:     Mouth: Mucous membranes are dry.  Eyes:     General: No scleral icterus.    Pupils: Pupils are equal, round, and reactive to light.  Neck:     Vascular: No JVD.  Cardiovascular:     Rate and Rhythm: Normal rate and regular rhythm.     Heart sounds: S1 normal and S2 normal.  Pulmonary:     Effort: Pulmonary effort is normal.     Breath sounds: Normal breath sounds. No wheezing, rhonchi or rales.  Abdominal:     General: Abdomen is protuberant. Bowel sounds are normal. There is no distension.     Tenderness: There is abdominal tenderness in the right upper quadrant and epigastric area. There is right CVA tenderness and guarding. There is no left CVA tenderness or rebound.   Musculoskeletal:     Cervical back: Neck supple.     Right lower leg: No edema.     Left lower leg: No edema.  Skin:    General: Skin is warm and dry.  Neurological:     General: No focal deficit present.     Mental Status: She is alert and oriented to person, place, and time.  Psychiatric:        Mood and Affect: Mood normal.        Behavior: Behavior normal. Behavior is cooperative.     Data Reviewed:  Results are pending, will review when available. EKG: Vent. rate 121 BPM PR interval 139 ms QRS duration 96 ms QT/QTcB 346/491 ms P-R-T axes 79 66 55 Sinus tachycardia Left atrial enlargement Borderline prolonged QT interval  Assessment and Plan: Principal Problem:   Hypokalemia Associated with:   Prolonged QT interval Admit to PCU/inpatient. Continue IV fluids. KCl replacement. Magnesium sulfate 2 g IVPB. Avoid QT prolonging meds as possible. Keep electrolytes optimized. Check EKG in the morning.  Active Problems:   SIRS (systemic inflammatory response syndrome) (HCC) Associated with fever  begin cefepime 2 g every 8 hours.   Begin metronidazole 500 mg IVPB q 12 hr. Follow-up blood culture and sensitivity Follow CBC and CMP in a.m.    Abdominal pain, chronic, right upper quadrant Associated with:   Nausea and vomiting In the setting of:   Portal vein thrombosis And:   Metastatic colon cancer to liver (HCC) Analgesics as needed. Antiemetics as needed.    Hypertension Parenteral antihypertensives as needed.     Advance Care Planning:   Code Status: Full Code   Consults: [facility] Yancey Flemings, MD).  Family Communication:   Severity of Illness: The appropriate patient status for this patient is OBSERVATION. Observation status is judged to be reasonable and necessary in order to provide the required intensity of service to ensure the patient's safety. The patient's presenting symptoms, physical exam findings, and initial radiographic  and laboratory data in the context of their medical condition is felt to place them at decreased risk for further clinical deterioration. Furthermore, it is  anticipated that the patient will be medically stable for discharge from the hospital within 2 midnights of admission.   Author: Bobette Mo, MD 03/09/2023 1:49 PM  For on call review [email or web address].   This document was prepared using Dragon voice recognition software and may contain some unintended transcription errors.

## 2023-03-09 NOTE — Consult Note (Addendum)
 Consultation Note   Referring Provider:  [facility] PCP: Tollie Eth, NP Primary Gastroenterologist:   Corliss Parish, MD   Reason for Consultation: RUQ pain DOA: 03/09/2023         Hospital Day: 1   ASSESSMENT    Brief Narrative:  48 y.o. year old female with hypertension, nephrolithiasis, metastatic colon cancer (complicated by biliary obstruction requiring ERCP with stenting, chronic pain, constipation   Sigmoid colon cancer diagnosed in 2021 s/p LAR. later found to have metastatic disease with resulting biliary obstruction requiring ERCP with stent placement in March 2024.  Residual colon cancer found at time of last colonoscopy Nov 2024. CT scan today showing nodular wall thickening with extension into the adjacent tissue  She had repeat ERCP with stent exchange on 02/26/23.    Acute on chronic abdominal pain currently localized to right mid / RLQ  Etiology unclear, no concerned about biliary source at this point. CTAP with contrast today shows indwelling biliary stents in place with some improvement in  biliary duct dilation and LFTs have continued to improve since recent biliary stent exchange. CT scan does suggest some progression of portal vein occlusion and the gallbladder is still dilated with an increase in wall edema but location of her pain doesn't quite correlate.   New leukocytosis and Fever  Temp 101.3, WBC 16.5 ( up from 9 on 2/14 Source not yet determined. Biliary tree is decompressed. .No acute findings on CXR. SARS, Influenza are negative. CT scan does show that the gallbladder continues to be dilated with some wall edema which is increasing. 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    Hypokalemia   Principal Problem:   Hypokalemia Active Problems:   SIRS (systemic inflammatory response syndrome) (HCC)      PLAN:   --Potassium repletion in progress --On Flagyl and Zosyn --Blood cultures  pending.  --Has Dilaudid ordered for pain.  --Miralax 17 grams daily    HPI   Brief GI History We saw April [name] during a hospitalization earlier this month for abdominal pain. She was due the following day for ERCP with stent exchange so that was done inpatient on 2/13 .   Interval History:  April [name]'s chronic generalized abdominal pain improved about stent exchange. Then a few days ago she began to have right mid and RLQ pain. Pain not related to eating. Pain worse with any movement. She had a small BM Friday but that didn't help. Yesterday she took a LInzess thinking pain was related to constipation. She didn't have a BM after the linzess. She has no urinary or vaginal symptoms.    Previous GI Evaluations   Most recent ERCP 02/26/23 - No gross lesions in the entire esophagus. Z-line irregular, 45 cm from the incisors. Erythematous mucosa in the antrum. No other gross lesions in the entire stomach. Biopsied. Erythematous duodenopathy. Prior biliary sphincterotomy appeared open. Two visibly patent stents from the biliary tree were seen in the major papilla. These were removed. 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 The left and right hepatic ducts and all intrahepatic branches were moderately dilated, secondary to aforementioned strictures. The strictures were dilated. The biliary tree was swept and  sludge was found. One plastic biliary stent was placed into the left hepatic duct. Subsequently a device failure led to inability to place adequate right hepatic duct stenting. This required [place] to remove both stents and restart cannulation of both the left and right systems.  One plastic biliary stent was placed into the left hepatic duct,   One plastic biliary stent was placed into the right hepatic duct (7 [place] by 12 cm).    Recent Labs    03/09/23 0943 03/09/23 0944  WBC  --  16.5*  HGB 13.3 12.1  HCT 39.0  37.7  PLT  --  387   Recent Labs    03/09/23 0943 03/09/23 0944  NA 136 137  K 2.8* 2.7*  CL 94* 96*  CO2  --  28  GLUCOSE 111* 110*  BUN 4* 6  CREATININE 0.50 0.43*  CALCIUM  --  9.4   Recent Labs    03/09/23 0944  PROT 8.0  ALBUMIN 3.6  AST 25  ALT 32  ALKPHOS 216*  BILITOT 0.6   Recent Labs    03/09/23 0944  LABPROT 13.9  INR 1.1      Past Medical History:  Diagnosis Date   Ascending aortic aneurysm (HCC) 12/16/2022   Benign essential hypertension 10/06/2017   Last Assessment & Plan:  Formatting of this note might be different from the original. Patient's blood pressure noted to be elevated at today's visit 188/98.  Patient will be provided with antihypertensives in the treatment center if needed in order to meet her Avastin parameters.  Patient recommended to follow up with her primary care physician regarding medication management.  Patient is not cur   Cancer [facility])    Change in bowel habits 06/28/2022   Colon cancer (HCC)    History of colon cancer 06/28/2022   Hypertension    Prolonged QT interval 01/07/2018   Renal calculi    bilateral   Sepsis due to undetermined organism (HCC) 12/24/2022   Sialoadenitis of submandibular gland 12/16/2022    Past Surgical History:  Procedure Laterality Date   BALLOON DILATION N/A 02/26/2023   Procedure: BALLOON DILATION;  Surgeon: Lemar Lofty., MD;  Location: [facility] ENDOSCOPY;  Service: Gastroenterology;  Laterality: N/A;   BILIARY BRUSHING  03/24/2022   Procedure: BILIARY BRUSHING;  Surgeon: Meridee Score Netty Starring., MD;  Location: [facility] ENDOSCOPY;  Service: Gastroenterology;;   BILIARY DILATION  03/24/2022   Procedure: BILIARY DILATION;  Surgeon: Lemar Lofty., MD;  Location: [facility] ENDOSCOPY;  Service: Gastroenterology;;   BILIARY STENT PLACEMENT  03/24/2022   Procedure: BILIARY STENT PLACEMENT;  Surgeon: Lemar Lofty., MD;  Location: [facility] ENDOSCOPY;  Service: Gastroenterology;;   BILIARY STENT  PLACEMENT N/A 02/26/2023   Procedure: BILIARY STENT PLACEMENT;  Surgeon: Lemar Lofty., MD;  Location: [facility] ENDOSCOPY;  Service: Gastroenterology;  Laterality: N/A;   BIOPSY  03/24/2022   Procedure: BIOPSY;  Surgeon: Meridee Score Netty Starring., MD;  Location: [facility] ENDOSCOPY;  Service: Gastroenterology;;   BIOPSY  11/23/2022   Procedure: BIOPSY;  Surgeon: Hilarie Fredrickson, MD;  Location: [facility] ENDOSCOPY;  Service: Gastroenterology;;   BIOPSY  02/26/2023   Procedure: BIOPSY;  Surgeon: Lemar Lofty., MD;  Location: [facility] ENDOSCOPY;  Service: Gastroenterology;;   CESAREAN SECTION     x2   COLONOSCOPY N/A 11/23/2022   Procedure: COLONOSCOPY;  Surgeon: Hilarie Fredrickson, MD;  Location: [facility] ENDOSCOPY;  Service: Gastroenterology;  Laterality: N/A;   CYSTOSCOPY WITH RETROGRADE PYELOGRAM, URETEROSCOPY AND STENT PLACEMENT Right 10/22/2020  Procedure: CYSTOSCOPY WITH RETROGRADE PYELOGRAM, URETEROSCOPY AND STENT PLACEMENT;  Surgeon: Noel Christmas, MD;  Location: [facility] ORS;  Service: Urology;  Laterality: Right;   ENDOSCOPIC RETROGRADE CHOLANGIOPANCREATOGRAPHY (ERCP) WITH PROPOFOL N/A 02/26/2023   Procedure: ENDOSCOPIC RETROGRADE CHOLANGIOPANCREATOGRAPHY (ERCP) WITH PROPOFOL;  Surgeon: Meridee Score Netty Starring., MD;  Location: [facility] ENDOSCOPY;  Service: Gastroenterology;  Laterality: N/A;   ERCP N/A 03/24/2022   Procedure: ENDOSCOPIC RETROGRADE CHOLANGIOPANCREATOGRAPHY (ERCP);  Surgeon: Lemar Lofty., MD;  Location: [facility] ENDOSCOPY;  Service: Gastroenterology;  Laterality: N/A;   ESOPHAGOGASTRODUODENOSCOPY (EGD) WITH PROPOFOL N/A 02/26/2023   Procedure: ESOPHAGOGASTRODUODENOSCOPY (EGD) WITH PROPOFOL;  Surgeon: Meridee Score Netty Starring., MD;  Location: [facility] ENDOSCOPY;  Service: Gastroenterology;  Laterality: N/A;  patient needs EGD in addition to ERCP   IR PATIENT EVAL TECH 0-60 MINS  12/15/2022   IR RADIOLOGIST EVAL & MGMT  09/16/2019   IR RADIOLOGIST EVAL & MGMT  09/23/2019   IR RADIOLOGIST EVAL & MGMT  09/20/2019    IR RADIOLOGIST EVAL & MGMT  09/30/2019   IR REMOVAL TUN ACCESS W/ PORT W/O FL MOD SED  09/14/2019   IR VENO/EXT/UNI RIGHT  04/11/2022   PANCREATIC STENT PLACEMENT  03/24/2022   Procedure: PANCREATIC STENT PLACEMENT;  Surgeon: Lemar Lofty., MD;  Location: [facility] ENDOSCOPY;  Service: Gastroenterology;;   REMOVAL OF STONES  03/24/2022   Procedure: REMOVAL OF STONES;  Surgeon: Lemar Lofty., MD;  Location: [facility] ENDOSCOPY;  Service: Gastroenterology;;   REMOVAL OF STONES  02/26/2023   Procedure: REMOVAL OF Sludge;  Surgeon: Lemar Lofty., MD;  Location: [facility] ENDOSCOPY;  Service: Gastroenterology;;   Dennison Mascot  03/24/2022   Procedure: Dennison Mascot;  Surgeon: Mansouraty, Netty Starring., MD;  Location: [facility] ENDOSCOPY;  Service: Gastroenterology;;   Francine Graven REMOVAL  02/26/2023   Procedure: STENT REMOVAL;  Surgeon: Lemar Lofty., MD;  Location: [facility] ENDOSCOPY;  Service: Gastroenterology;;   URETERAL STENT PLACEMENT      Family History  Problem Relation Age of Onset   Hypertension Mother    Diabetes Mellitus II Father    Stroke Father    Ulcers Sister    Colon cancer Neg Hx    Esophageal cancer Neg Hx    Inflammatory bowel disease Neg Hx    Liver disease Neg Hx    Pancreatic cancer Neg Hx    Rectal cancer Neg Hx    Stomach cancer Neg Hx     Prior to Admission medications   Medication Sig Start Date End Date Taking? Authorizing Provider  amLODipine (NORVASC) 10 MG tablet Take 1 tablet (10 mg total) by mouth daily. 11/24/22  Yes Rodolph Bong, MD  dexamethasone (DECADRON) 4 MG tablet Take 1 tablet by mouth twice a day for 3 days beginning day 2 of chemo. 02/03/23  Yes Rana Snare, NP  dicyclomine (BENTYL) 20 MG tablet Take 1 tablet (20 mg total) by mouth 3 (three) times daily as needed for spasms (rectal/AB pain). Patient taking differently: Take 20 mg by mouth 2 (two) times daily as needed for spasms (rectal/AB pain). 02/17/23  Yes Ladene Artist, MD   HYDROmorphone (DILAUDID) 2 MG tablet Take 1 tablet (2 mg total) by mouth every 6 (six) hours as needed for severe pain (pain score 7-10). Patient taking differently: Take 4 mg by mouth 3 (three) times daily as needed for severe pain (pain score 7-10). 02/27/23  Yes Meredeth Ide, MD  lactulose (CHRONULAC) 10 GM/15ML solution Take 15-30 mLs by mouth daily as needed for mild constipation.  Yes [provider]  linaclotide (LINZESS) 72 MCG capsule Take 72 mcg by mouth daily as needed (constipation).   Yes [provider]  metoprolol tartrate (LOPRESSOR) 50 MG tablet Take 1 tablet (50 mg total) by mouth 2 (two) times daily. 11/24/22  Yes Rodolph Bong, MD  pantoprazole (PROTONIX) 40 MG tablet Take 1 tablet (40 mg total) by mouth daily before breakfast. 02/27/23  Yes Sharl Ma, [name] Ill, MD  polyethylene glycol (MIRALAX / GLYCOLAX) 17 g packet Take 17 g by mouth daily as needed. Patient taking differently: Take 17 g by mouth daily as needed for moderate constipation or mild constipation. 02/27/23  Yes Sharl Ma, [name] Ill, MD  potassium chloride (MICRO-K) 10 MEQ CR capsule Take 2 capsules (20 mEq total) by mouth 2 (two) times daily. Patient taking differently: Take 30 mEq by mouth 2 (two) times daily. 11/24/22  Yes Rodolph Bong, MD  prochlorperazine (COMPAZINE) 10 MG tablet Take 1 tablet (10 mg total) by mouth every 6 (six) hours as needed for nausea or vomiting. 02/03/23  Yes Rana Snare, NP  doxycycline (ADOXA) 100 MG tablet Take 100 mg by mouth 2 (two) times daily. Patient not taking: Reported on 03/09/2023    [provider]  magic mouthwash (nystatin, diphenhydrAMINE, alum & mag hydroxide) suspension mixture Swish and Spit 5 mLs by mouth 4 (four) times daily as needed. Patient not taking: Reported on 02/17/2023 02/03/23   Rana Snare, NP  morphine (MS CONTIN) 30 MG 12 hr tablet Take 30 mg by mouth every 12 (twelve) hours. Patient not taking: Reported on 03/09/2023     [provider]  ondansetron (ZOFRAN) 8 MG tablet Take 1 tablet (8 mg total) by mouth every 8 (eight) hours as needed for nausea or vomiting. Can start taking 72 hours after chemotherapy Patient not taking: Reported on 03/09/2023 02/03/23   Rana Snare, NP    Current Facility-Administered Medications  Medication Dose Route Frequency Provider Last Rate Last Admin   0.9 % NaCl with KCl 40 mEq / L  infusion   Intravenous Continuous Bobette Mo, MD       acetaminophen (TYLENOL) tablet 650 mg  650 mg Oral Q6H PRN Bobette Mo, MD   650 mg at 03/09/23 1455   Or   acetaminophen (TYLENOL) suppository 650 mg  650 mg Rectal Q6H PRN Bobette Mo, MD       ceFEPIme (MAXIPIME) 2 g in sodium chloride 0.9 % 100 mL IVPB  2 g Intravenous Q8H Bobette Mo, MD       enoxaparin (LOVENOX) injection 40 mg  40 mg Subcutaneous Q24H Bobette Mo, MD       lactated ringers infusion  150 mL/hr Intravenous Continuous Bobette Mo, MD       [START ON 03/10/2023] metroNIDAZOLE (FLAGYL) IVPB 500 mg  500 mg Intravenous Q12H Bobette Mo, MD       ondansetron [facility]) tablet 4 mg  4 mg Oral Q6H PRN Bobette Mo, MD       Or   ondansetron [facility]) injection 4 mg  4 mg Intravenous Q6H PRN Bobette Mo, MD       potassium chloride 10 mEq in 100 mL IVPB  10 mEq Intravenous Q1 Hr x 4 Bobette Mo, MD       sodium chloride 0.9 % bolus 1,000 mL  1,000 mL Intravenous Once Bobette Mo, MD       Current Outpatient Medications  Medication Sig Dispense Refill   amLODipine (NORVASC) 10 MG tablet Take 1 tablet (10 mg total) by mouth daily. 90 tablet 0   dexamethasone (DECADRON) 4 MG tablet Take 1 tablet by mouth twice a day for 3 days beginning day 2 of chemo. 12 tablet 2   dicyclomine (BENTYL) 20 MG tablet Take 1 tablet (20 mg total) by mouth 3 (three) times daily as needed for spasms (rectal/AB pain). (Patient taking differently: Take 20 mg by mouth  2 (two) times daily as needed for spasms (rectal/AB pain).) 60 tablet 1   HYDROmorphone (DILAUDID) 2 MG tablet Take 1 tablet (2 mg total) by mouth every 6 (six) hours as needed for severe pain (pain score 7-10). (Patient taking differently: Take 4 mg by mouth 3 (three) times daily as needed for severe pain (pain score 7-10).) 30 tablet 0   lactulose (CHRONULAC) 10 GM/15ML solution Take 15-30 mLs by mouth daily as needed for mild constipation.     linaclotide (LINZESS) 72 MCG capsule Take 72 mcg by mouth daily as needed (constipation).     metoprolol tartrate (LOPRESSOR) 50 MG tablet Take 1 tablet (50 mg total) by mouth 2 (two) times daily. 180 tablet 0   pantoprazole (PROTONIX) 40 MG tablet Take 1 tablet (40 mg total) by mouth daily before breakfast. 30 tablet 6   polyethylene glycol (MIRALAX / GLYCOLAX) 17 g packet Take 17 g by mouth daily as needed. (Patient taking differently: Take 17 g by mouth daily as needed for moderate constipation or mild constipation.) 14 each 0   potassium chloride (MICRO-K) 10 MEQ CR capsule Take 2 capsules (20 mEq total) by mouth 2 (two) times daily. (Patient taking differently: Take 30 mEq by mouth 2 (two) times daily.) 120 capsule 2   prochlorperazine (COMPAZINE) 10 MG tablet Take 1 tablet (10 mg total) by mouth every 6 (six) hours as needed for nausea or vomiting. 30 tablet 2   doxycycline (ADOXA) 100 MG tablet Take 100 mg by mouth 2 (two) times daily. (Patient not taking: Reported on 03/09/2023)     magic mouthwash (nystatin, diphenhydrAMINE, alum & mag hydroxide) suspension mixture Swish and Spit 5 mLs by mouth 4 (four) times daily as needed. (Patient not taking: Reported on 02/17/2023) 240 mL 1   morphine (MS CONTIN) 30 MG 12 hr tablet Take 30 mg by mouth every 12 (twelve) hours. (Patient not taking: Reported on 03/09/2023)     ondansetron (ZOFRAN) 8 MG tablet Take 1 tablet (8 mg total) by mouth every 8 (eight) hours as needed for nausea or vomiting. Can start taking 72  hours after chemotherapy (Patient not taking: Reported on 03/09/2023) 30 tablet 1    Allergies as of 03/09/2023 - Review Complete 03/09/2023  Allergen Reaction Noted   Lisinopril Swelling and Other (See Comments) 07/21/2019   Irinotecan Other (See Comments) 10/03/2020   Leucovorin calcium Itching 08/04/2022   Oxaliplatin Itching 08/04/2022    Social History   Socioeconomic History   Marital status: Single    Spouse name: Not on file   Number of children: Not on file   Years of education: Not on file   Highest education level: Not on file  Occupational History   Not on file  Tobacco Use   Smoking status: Former    Types: Cigarettes   Smokeless tobacco: Never   Tobacco comments:    Quit 1 month ago   Vaping Use   Vaping status: Never Used  Substance and Sexual Activity   Alcohol  use: Not Currently   Drug use: Not Currently    Types: Marijuana   Sexual activity: Not Currently  Other Topics Concern   Not on file  Social History Narrative   Not on file   Social Drivers of Health   Financial Resource Strain: Low Risk  (11/11/2022)   Overall Financial Resource Strain (CARDIA)    Difficulty of Paying Living Expenses: Not hard at all  Food Insecurity: No Food Insecurity (02/25/2023)   Hunger Vital Sign    Worried About Running Out of Food in the Last Year: Never true    Ran Out of Food in the Last Year: Never true  Recent Concern: Food Insecurity - Food Insecurity Present (02/05/2023)   Hunger Vital Sign    Worried About Running Out of Food in the Last Year: Sometimes true    Ran Out of Food in the Last Year: Sometimes true  Transportation Needs: No Transportation Needs (02/25/2023)   PRAPARE - Administrator, Civil Service (Medical): No    Lack of Transportation (Non-Medical): No  Physical Activity: Inactive (11/11/2022)   Exercise Vital Sign    Days of Exercise per Week: 0 days    Minutes of Exercise per Session: 0 min  Stress: No Stress Concern Present  (11/11/2022)   [employer] of Occupational Health - Occupational Stress Questionnaire    Feeling of Stress : Not at all  Social Connections: Socially Isolated (01/24/2023)   Social Connection and Isolation Panel [NHANES]    Frequency of Communication with Friends and Family: More than three times a week    Frequency of Social Gatherings with Friends and Family: More than three times a week    Attends Religious Services: Never    Database administrator or Organizations: No    Attends Banker Meetings: Never    Marital Status: Never married  Intimate Partner Violence: Not At Risk (02/25/2023)   Humiliation, Afraid, Rape, and Kick questionnaire    Fear of Current or Ex-Partner: No    Emotionally Abused: No    Physically Abused: No    Sexually Abused: No     Code Status   Code Status: Full Code  Review of Systems: All systems reviewed and negative except where noted in HPI.  Physical Exam: Vital signs in last 24 hours: Temp:  [99.2 F (37.3 C)-101.3 F (38.5 C)] 101.3 F (38.5 C) (02/24 1413) Pulse Rate:  [118-123] 118 (02/24 1100) Resp:  [22-23] 23 (02/24 1100) BP: (122-134)/(102-108) 134/102 (02/24 1100) SpO2:  [98 %-99 %] 98 % (02/24 1100)    General:  Pleasant female in NAD Psych:  Cooperative. Normal mood and affect Eyes: Pupils equal Ears:  Normal auditory acuity Nose: No deformity, discharge or lesions Neck:  Supple, no masses felt Lungs:  Clear to auscultation.  Heart:  Regular rate, regular rhythm.  Abdomen:  Soft, nondistended, significant generalized right sided tenderness most pronounced in right mid and RLQ,  active bowel sounds Rectal :  Deferred Msk: Symmetrical without gross deformities.  Neurologic:  Alert, oriented, grossly normal neurologically Extremities : No edema Skin:  Intact without significant lesions.    Intake/Output from previous day: No intake/output data recorded. Intake/Output this shift:  Total I/O In: 50 [IV  Piggyback:50] Out: -    April Cluster, NP-C   03/09/2023, 3:21 PM  GI ATTENDING  History, laboratories, x-rays, prior endoscopy reports all personally reviewed.  Patient seen and examined as outlined above.  Agree with comprehensive consultation note.  Unfortunate patient with metastatic colon cancer.  Presents with right upper quadrant pain, fever, leukocytosis.  Recent ERCP with successful stent exchange 11 days ago.  Improved liver tests and radiology.  She has chronic right upper quadrant pain.  There is no evidence for cholangitis or stent dysfunction.  At this point, recommend primary service aggressively workup fever and leukocytosis as they are doing.  No indication for biliary intervention.  Also, pain control per primary service.  We will sign off for now, but we are available if needed for questions or other relevant issues.  Wilhemina Bonito. Eda Keys., M.D. [facility] Division of Gastroenterology

## 2023-03-09 NOTE — ED Provider Notes (Signed)
 [place] EMERGENCY DEPARTMENT AT [facility] Provider Note  CSN: [id number] Arrival date & time: 03/09/23 6962  Chief Complaint(s) Abdominal Pain  HPI April [name] is a 48 y.o. female here today for abdominal pain.  Patient has history of colon cancer.  She is currently receiving chemotherapy, last treatment was 3 weeks ago.  Patient says that last evening she began to have severe right lower quadrant abdominal pain.  Was taken her home medications without relief.   Past Medical History Past Medical History:  Diagnosis Date   Ascending aortic aneurysm (HCC) 12/16/2022   Benign essential hypertension 10/06/2017   Last Assessment & Plan:  Formatting of this note might be different from the original. Patient's blood pressure noted to be elevated at today's visit 188/98.  Patient will be provided with antihypertensives in the treatment center if needed in order to meet her Avastin parameters.  Patient recommended to follow up with her primary care physician regarding medication management.  Patient is not cur   Cancer [facility])    Change in bowel habits 06/28/2022   Colon cancer (HCC)    History of colon cancer 06/28/2022   Hypertension    Prolonged QT interval 01/07/2018   Renal calculi    bilateral   Sepsis due to undetermined organism (HCC) 12/24/2022   Sialoadenitis of submandibular gland 12/16/2022   Patient Active Problem List   Diagnosis Date Noted   Nausea and vomiting 02/26/2023   Upper abdominal pain 02/24/2023   Intractable vomiting 01/25/2023   Hyperproteinemia 01/24/2023   Elevated alkaline phosphatase level 01/24/2023   History of biliary stent insertion 12/31/2022   Intrahepatic bile duct dilation 12/31/2022   Imaging of gastrointestinal tract abnormal 12/31/2022   Hospital discharge follow-up 12/31/2022   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   Enterocolitis 12/26/2022   Bacteremia due to Klebsiella pneumoniae 12/26/2022    Hyponatremia 12/24/2022   Dehydration 12/16/2022   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   Ascending aortic aneurysm (HCC) 12/16/2022   Rectal polyp 11/23/2022   Diverticulosis of colon without hemorrhage 11/23/2022   Abnormal CT of the abdomen 11/21/2022   Cancer related pain 11/19/2022   Acute nonintractable headache 08/11/2022   History of ERCP 06/28/2022   Former smoker 04/30/2022   Intractable nausea and vomiting 04/16/2022   Protein-calorie malnutrition, severe 03/26/2022   Chronic constipation 03/26/2022   Metastatic colon cancer to liver (HCC) 03/24/2022   Obstructive jaundice 03/24/2022   Biliary stricture 03/24/2022   Biliary obstruction 03/23/2022   Hyperbilirubinemia 03/21/2022   Abnormal LFTs 03/21/2022   Nephrolithiasis 03/21/2022   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 Authorizing Provider  amLODipine (NORVASC) 10 MG tablet Take 1 tablet (10 mg total) by mouth daily. 11/24/22   Rodolph Bong, MD  dexamethasone (DECADRON) 4 MG tablet Take 1 tablet by mouth twice a day for 3 days beginning day 2 of chemo. 02/03/23  Rana Snare, NP  dicyclomine (BENTYL) 20 MG tablet Take 1 tablet (20 mg total) by mouth 3 (three) times daily as needed for spasms (rectal/AB pain). 02/17/23   Ladene Artist, MD  HYDROmorphone (DILAUDID) 2 MG tablet Take 1 tablet (2 mg total)  by mouth every 6 (six) hours as needed for severe pain (pain score 7-10). 02/27/23   Meredeth Ide, MD  lactulose (CHRONULAC) 10 GM/15ML solution Take 15-30 mLs by mouth daily as needed for mild constipation.    [provider]  magic mouthwash (nystatin, diphenhydrAMINE, alum & mag hydroxide) suspension mixture Swish and Spit 5 mLs by mouth 4 (four) times daily as needed. Patient not taking: Reported on 02/17/2023 02/03/23   Rana Snare, NP  metoprolol tartrate (LOPRESSOR) 50 MG tablet Take 1 tablet (50 mg total) by mouth 2 (two) times daily. 11/24/22   Rodolph Bong, MD  ondansetron (ZOFRAN) 8 MG tablet Take 1 tablet (8 mg total) by mouth every 8 (eight) hours as needed for nausea or vomiting. Can start taking 72 hours after chemotherapy 02/03/23   Rana Snare, NP  pantoprazole (PROTONIX) 40 MG tablet Take 1 tablet (40 mg total) by mouth daily before breakfast. 02/27/23   Meredeth Ide, MD  polyethylene glycol (MIRALAX / GLYCOLAX) 17 g packet Take 17 g by mouth daily as needed. 02/27/23   Meredeth Ide, MD  potassium chloride (MICRO-K) 10 MEQ CR capsule Take 2 capsules (20 mEq total) by mouth 2 (two) times daily. 11/24/22   Rodolph Bong, MD  prochlorperazine (COMPAZINE) 10 MG tablet Take 1 tablet (10 mg total) by mouth every 6 (six) hours as needed for nausea or vomiting. 02/03/23   Rana Snare, NP                                                                                                                                    Past Surgical History Past Surgical History:  Procedure Laterality Date   BALLOON DILATION N/A 02/26/2023   Procedure: BALLOON DILATION;  Surgeon: Meridee Score Netty Starring., MD;  Location: [facility] ENDOSCOPY;  Service: Gastroenterology;  Laterality: N/A;   BILIARY BRUSHING  03/24/2022   Procedure: BILIARY BRUSHING;  Surgeon: Meridee Score Netty Starring., MD;  Location: [facility] ENDOSCOPY;  Service: Gastroenterology;;   BILIARY DILATION  03/24/2022   Procedure: BILIARY  DILATION;  Surgeon: Lemar Lofty., MD;  Location: [facility] ENDOSCOPY;  Service: Gastroenterology;;   BILIARY STENT PLACEMENT  03/24/2022   Procedure: BILIARY STENT PLACEMENT;  Surgeon: Lemar Lofty., MD;  Location: [facility] ENDOSCOPY;  Service: Gastroenterology;;   BILIARY STENT PLACEMENT N/A 02/26/2023   Procedure: BILIARY STENT PLACEMENT;  Surgeon: Lemar Lofty., MD;  Location: [facility] ENDOSCOPY;  Service: Gastroenterology;  Laterality: N/A;   BIOPSY  03/24/2022   Procedure: BIOPSY;  Surgeon: Meridee Score Netty Starring., MD;  Location: [facility] ENDOSCOPY;  Service: Gastroenterology;;   BIOPSY  11/23/2022  Procedure: BIOPSY;  Surgeon: Hilarie Fredrickson, MD;  Location: [facility] ENDOSCOPY;  Service: Gastroenterology;;   BIOPSY  02/26/2023   Procedure: BIOPSY;  Surgeon: Lemar Lofty., MD;  Location: [facility] ENDOSCOPY;  Service: Gastroenterology;;   CESAREAN SECTION     x2   COLONOSCOPY N/A 11/23/2022   Procedure: COLONOSCOPY;  Surgeon: Hilarie Fredrickson, MD;  Location: [facility] ENDOSCOPY;  Service: Gastroenterology;  Laterality: N/A;   CYSTOSCOPY WITH RETROGRADE PYELOGRAM, URETEROSCOPY AND STENT PLACEMENT Right 10/22/2020   Procedure: CYSTOSCOPY WITH RETROGRADE PYELOGRAM, URETEROSCOPY AND STENT PLACEMENT;  Surgeon: Noel Christmas, MD;  Location: [facility] ORS;  Service: Urology;  Laterality: Right;   ENDOSCOPIC RETROGRADE CHOLANGIOPANCREATOGRAPHY (ERCP) WITH PROPOFOL N/A 02/26/2023   Procedure: ENDOSCOPIC RETROGRADE CHOLANGIOPANCREATOGRAPHY (ERCP) WITH PROPOFOL;  Surgeon: Meridee Score Netty Starring., MD;  Location: [facility] ENDOSCOPY;  Service: Gastroenterology;  Laterality: N/A;   ERCP N/A 03/24/2022   Procedure: ENDOSCOPIC RETROGRADE CHOLANGIOPANCREATOGRAPHY (ERCP);  Surgeon: Lemar Lofty., MD;  Location: [facility] ENDOSCOPY;  Service: Gastroenterology;  Laterality: N/A;   ESOPHAGOGASTRODUODENOSCOPY (EGD) WITH PROPOFOL N/A 02/26/2023   Procedure: ESOPHAGOGASTRODUODENOSCOPY (EGD) WITH PROPOFOL;  Surgeon: Meridee Score Netty Starring., MD;  Location: [facility] ENDOSCOPY;  Service: Gastroenterology;  Laterality: N/A;  patient needs EGD in addition to ERCP   IR PATIENT EVAL TECH 0-60 MINS  12/15/2022   IR RADIOLOGIST EVAL & MGMT  09/16/2019   IR RADIOLOGIST EVAL & MGMT  09/23/2019   IR RADIOLOGIST EVAL & MGMT  09/20/2019   IR RADIOLOGIST EVAL & MGMT  09/30/2019   IR REMOVAL TUN ACCESS W/ PORT W/O FL MOD SED  09/14/2019   IR VENO/EXT/UNI RIGHT  04/11/2022   PANCREATIC STENT PLACEMENT  03/24/2022   Procedure: PANCREATIC STENT PLACEMENT;  Surgeon: Lemar Lofty., MD;  Location: [facility] ENDOSCOPY;  Service: Gastroenterology;;   REMOVAL OF STONES  03/24/2022   Procedure: REMOVAL OF STONES;  Surgeon: Lemar Lofty., MD;  Location: [facility] ENDOSCOPY;  Service: Gastroenterology;;   REMOVAL OF STONES  02/26/2023   Procedure: REMOVAL OF Sludge;  Surgeon: Lemar Lofty., MD;  Location: [facility] ENDOSCOPY;  Service: Gastroenterology;;   Dennison Mascot  03/24/2022   Procedure: Dennison Mascot;  Surgeon: Mansouraty, Netty Starring., MD;  Location: [facility] ENDOSCOPY;  Service: Gastroenterology;;   Francine Graven REMOVAL  02/26/2023   Procedure: STENT REMOVAL;  Surgeon: Lemar Lofty., MD;  Location: [facility] ENDOSCOPY;  Service: Gastroenterology;;   URETERAL STENT PLACEMENT     Family History Family History  Problem Relation Age of Onset   Hypertension Mother    Diabetes Mellitus II Father    Stroke Father    Ulcers Sister    Colon cancer Neg Hx    Esophageal cancer Neg Hx    Inflammatory bowel disease Neg Hx    Liver disease Neg Hx    Pancreatic cancer Neg Hx    Rectal cancer Neg Hx    Stomach cancer Neg Hx     Social History Social History   Tobacco Use   Smoking status: Former    Types: Cigarettes   Smokeless tobacco: Never   Tobacco comments:    Quit 1 month ago   Vaping Use   Vaping status: Never Used  Substance Use Topics   Alcohol use: Not Currently   Drug use: Not Currently    Types: Marijuana   Allergies Lisinopril,  Irinotecan, Leucovorin calcium, and Oxaliplatin  Review of Systems Review of Systems  Physical Exam Vital Signs  I have reviewed the triage vital signs BP (!) 134/102   Pulse Marland Kitchen)  118   Temp 99.2 F (37.3 C) (Oral)   Resp (!) 23   LMP 01/07/2006   SpO2 98%   Physical Exam Vitals reviewed.  Abdominal:     General: There is distension.     Palpations: There is no fluid wave.     Tenderness: There is abdominal tenderness in the right lower quadrant. There is guarding and rebound. There is no right CVA tenderness or left CVA tenderness.     ED Results and Treatments Labs (all labs ordered are listed, but only abnormal results are displayed) Labs Reviewed  COMPREHENSIVE METABOLIC PANEL - Abnormal; Notable for the following components:      Result Value   Potassium 2.7 (*)    Chloride 96 (*)    Glucose, Bld 110 (*)    Creatinine, Ser 0.43 (*)    Alkaline Phosphatase 216 (*)    All other components within normal limits  CBC WITH DIFFERENTIAL/PLATELET - Abnormal; Notable for the following components:   WBC 16.5 (*)    RDW 16.6 (*)    Neutro Abs 12.9 (*)    Monocytes Absolute 1.3 (*)    Abs Immature Granulocytes 0.08 (*)    All other components within normal limits  I-STAT CHEM 8, ED - Abnormal; Notable for the following components:   Potassium 2.8 (*)    Chloride 94 (*)    BUN 4 (*)    Glucose, Bld 111 (*)    All other components within normal limits  RESP PANEL BY RT-PCR (RSV, FLU A&B, COVID)  RVPGX2  CULTURE, BLOOD (ROUTINE X 2)  CULTURE, BLOOD (ROUTINE X 2)  PROTIME-INR  APTT  URINALYSIS, W/ REFLEX TO CULTURE (INFECTION SUSPECTED)  I-STAT CG4 LACTIC ACID, ED  I-STAT CG4 LACTIC ACID, ED                                                                                                                          Radiology CT ABDOMEN PELVIS W CONTRAST Result Date: 03/09/2023 CLINICAL DATA:  Rectal cancer with liver metastases with abdominal pain. * Tracking Code: BO *  EXAM: CT ABDOMEN AND PELVIS WITH CONTRAST TECHNIQUE: Multidetector CT imaging of the abdomen and pelvis was performed using the standard protocol following bolus administration of intravenous contrast. RADIATION DOSE REDUCTION: This exam was performed according to the departmental dose-optimization program which includes automated exposure control, adjustment of the mA and/or kV according to patient size and/or use of iterative reconstruction technique. CONTRAST:  OMNIPAQUE IOHEXOL 300 MG/ML  SOLN COMPARISON:  CT 02/24/2023. FINDINGS: Lower chest: There is some linear opacity lung bases likely scar or atelectasis. No pleural effusion. Hepatobiliary: There is areas of fatty liver infiltration particularly geographically along the right hepatic lobe more so than left. This is progressive from previous examination. Portal vein is patent grossly but somewhat narrowed by extrinsic compression. There are indwelling endoscopically placed stents. Level of intrahepatic biliary duct dilatation is slightly improving on the left and some on the  right. The gallbladder continues to be dilated with some wall edema which is increasing. There is more nodular tissue extending up along the porta hepatis as best seen on coronal series 8, image 49. This has a differential including progression of disease. There are some areas of progressive portal vein occlusion such is anteriorly in the right hepatic lobe inferiorly best seen on coronal imaging. Pancreas: No pancreatic mass. Persistent pancreas ductal dilatation, similar to previous. Spleen: Normal in size without focal abnormality. Adrenals/Urinary Tract: Adrenal glands are preserved. Bilateral nonobstructing renal stones. No enhancing mass or collecting system dilatation. Preserved contour to the urinary bladder. Areas of atrophy in the right kidney are again noted. Stomach/Bowel: On this non oral contrast exam large bowel has a normal course and caliber with scattered stool.  There are loops of decompressed transverse colon. The small bowel is nondilated. Along the rectum once again is nodular wall thickening with extension into the adjacent tissue consistent with known history of neoplasm. Nodular areas are seen extending perirectal and presacral spaces. Vascular/Lymphatic: Normal caliber aorta and IVC. There is ectasia of the left common iliac artery with diameter approaching 19 mm. Scattered atherosclerotic calcified plaque. Several small less than 1 cm size in short axis retroperitoneal nodes are seen, not pathologic by size criteria. Reproductive: Uterus is present. Other: Peritoneal carcinomatosis again identified. Example measured previously anterior in the central upper abdomen with transverse dimension of 2.2 cm. Today this is measured 2.5 cm. Other areas are also similar including the pericolic gutters. There is also thickening along the surface of the liver towards the dome on the right side and laterally. Some these areas appear more prominent today than the recent prior this could be technical. Musculoskeletal: Curvature of the spine with some degenerative changes. 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 IMPRESSION: Again note is made of rectal carcinoma with peritoneal carcinomatosis. Trace ascites. The level of nodular thickening in the liver hilum appears slightly increased from previous. Indwelling biliary stents with some proving biliary ductal dilatation compared to the prior examination. However there is a more heterogeneous appearance of the liver with more periportal edema and areas of potential developing portal vein peripheral branch thrombus particularly along the inferior aspect of the liver. Bilateral nonobstructing renal stones.  Atrophy of the right kidney. Critical Value/emergent results were called by telephone at the time of interpretation on 03/09/2023 at 11:28 am to provider [facility] , who verbally acknowledged these results.  Electronically Signed   By: Karen Kays M.D.   On: 03/09/2023 11:49   DG Chest Port 1 View Result Date: 03/09/2023 CLINICAL DATA:  48 year old female with possible sepsis. Metastatic rectal cancer. EXAM: PORTABLE CHEST 1 VIEW COMPARISON:  CT Chest, Abdomen, and Pelvis 01/24/2023 and earlier. FINDINGS: Portable AP semi upright view at 0949 hours. Lung volumes and mediastinal contours remain normal. Left side PICC line remains in place. Visualized tracheal air column is within normal limits. Allowing for portable technique the lungs are clear. No pneumothorax or pleural effusion. No evidence of pneumoperitoneum. Negative visible bowel gas. No acute osseous abnormality identified. IMPRESSION: No acute cardiopulmonary abnormality. Electronically Signed   By: Odessa Fleming M.D.   On: 03/09/2023 10:23    Pertinent labs & imaging results that were available during my care of the patient were reviewed by me and considered in my medical decision making (see MDM for details).  Medications Ordered in ED Medications  HYDROmorphone (DILAUDID) injection 1 mg (1 mg Intravenous Given 03/09/23 0941)  ondansetron (ZOFRAN)  injection 4 mg (4 mg Intravenous Given 03/09/23 0942)  iohexol (OMNIPAQUE) 300 MG/ML solution 100 mL (100 mLs Intravenous Contrast Given 03/09/23 1021)  piperacillin-tazobactam (ZOSYN) IVPB 3.375 g (0 g Intravenous Stopped 03/09/23 1217)  lactated ringers bolus 1,000 mL (1,000 mLs Intravenous New Bag/Given 03/09/23 1147)  HYDROmorphone (DILAUDID) injection 1 mg (1 mg Intravenous Given 03/09/23 1134)  potassium chloride 10 mEq in 100 mL IVPB (10 mEq Intravenous New Bag/Given 03/09/23 1147)                                                                                                                                     Procedures .Critical Care  Performed by: Arletha Pili, DO Authorized by: Arletha Pili, DO   Critical care provider statement:    Critical care time (minutes):  30   Critical  care was necessary to treat or prevent imminent or life-threatening deterioration of the following conditions:  Metabolic crisis   Critical care was time spent personally by me on the following activities:  Development of treatment plan with patient or surrogate, discussions with consultants, evaluation of patient's response to treatment, examination of patient, ordering and review of laboratory studies, ordering and review of radiographic studies, ordering and performing treatments and interventions, pulse oximetry, re-evaluation of patient's condition and review of old charts   (including critical care time)  Medical Decision Making / ED Course   This patient presents to the ED for concern of abdominal pain, this involves an extensive number of treatment options, and is a complaint that carries with it a high risk of complications and morbidity.  The differential diagnosis includes perforation, sepsis, obstruction, intra-abdominal infection, cystitis.  MDM: Patient with a very tender abdomen, and voluntary guarding.  With her history of cancer, do of concern for perforation.  Patient's heart rate elevated, borderline febrile.  I have initiated sepsis workup, prioritizing CT imaging for this patient.  Analgesia, antiemetics provided.  Patient was seen earlier this month, had hepatic stents placed.   Reassessment 1:25 PM-patient's CT imaging shows possible peripheral portal vein thrombus.  Patient with a leukocytosis, does meet sepsis criteria.  Did receive Zosyn.  Lactic acid not elevated.  I do not believe patient has sepsis at this time.  Hypokalemic, have begun to replace.  Will admit patient to hospitalist.  Oncology consulted, Dr. Truett Perna.   Additional history obtained: -Additional history obtained from  -External records from outside source obtained and reviewed including: Chart review including previous notes, labs, imaging, consultation notes   Lab Tests: -I ordered, reviewed,  and interpreted labs.   The pertinent results include:   Labs Reviewed  COMPREHENSIVE METABOLIC PANEL - Abnormal; Notable for the following components:      Result Value   Potassium 2.7 (*)    Chloride 96 (*)    Glucose, Bld 110 (*)    Creatinine, Ser 0.43 (*)    Alkaline Phosphatase 216 (*)  All other components within normal limits  CBC WITH DIFFERENTIAL/PLATELET - Abnormal; Notable for the following components:   WBC 16.5 (*)    RDW 16.6 (*)    Neutro Abs 12.9 (*)    Monocytes Absolute 1.3 (*)    Abs Immature Granulocytes 0.08 (*)    All other components within normal limits  I-STAT CHEM 8, ED - Abnormal; Notable for the following components:   Potassium 2.8 (*)    Chloride 94 (*)    BUN 4 (*)    Glucose, Bld 111 (*)    All other components within normal limits  RESP PANEL BY RT-PCR (RSV, FLU A&B, COVID)  RVPGX2  CULTURE, BLOOD (ROUTINE X 2)  CULTURE, BLOOD (ROUTINE X 2)  PROTIME-INR  APTT  URINALYSIS, W/ REFLEX TO CULTURE (INFECTION SUSPECTED)  I-STAT CG4 LACTIC ACID, ED  I-STAT CG4 LACTIC ACID, ED      EKG sinus tachycardia, no evidence of acute ischemia  EKG Interpretation Date/Time:    Ventricular Rate:    PR Interval:    QRS Duration:    QT Interval:    QTC Calculation:   R Axis:      Text Interpretation:           Imaging Studies ordered: I ordered imaging studies including CT imaging the abdomen pelvis I independently visualized and interpreted imaging. I agree with the radiologist interpretation   Medicines ordered and prescription drug management: Meds ordered this encounter  Medications   HYDROmorphone (DILAUDID) injection 1 mg   ondansetron (ZOFRAN) injection 4 mg   iohexol (OMNIPAQUE) 300 MG/ML solution 100 mL   piperacillin-tazobactam (ZOSYN) IVPB 3.375 g    Antibiotic Indication::   Sepsis   lactated ringers bolus 1,000 mL   HYDROmorphone (DILAUDID) injection 1 mg   potassium chloride 10 mEq in 100 mL IVPB    -I have reviewed  the patients home medicines and have made adjustments as needed  Critical interventions Surgical anterior, hypokalemia  Consultations Obtained: I requested consultation with the oncology team,  and discussed lab and imaging findings as well as pertinent plan - they recommend: Admission   Cardiac Monitoring: The patient was maintained on a cardiac monitor.  I personally viewed and interpreted the cardiac monitored which showed an underlying rhythm of: Sinus tachycardia  Social Determinants of Health:  Factors impacting patients care include: Multiple medical comorbidities including stage IV cancer   Reevaluation: After the interventions noted above, I reevaluated the patient and found that they have :improved  Co morbidities that complicate the patient evaluation  Past Medical History:  Diagnosis Date   Ascending aortic aneurysm (HCC) 12/16/2022   Benign essential hypertension 10/06/2017   Last Assessment & Plan:  Formatting of this note might be different from the original. Patient's blood pressure noted to be elevated at today's visit 188/98.  Patient will be provided with antihypertensives in the treatment center if needed in order to meet her Avastin parameters.  Patient recommended to follow up with her primary care physician regarding medication management.  Patient is not cur   Cancer [facility])    Change in bowel habits 06/28/2022   Colon cancer (HCC)    History of colon cancer 06/28/2022   Hypertension    Prolonged QT interval 01/07/2018   Renal calculi    bilateral   Sepsis due to undetermined organism (HCC) 12/24/2022   Sialoadenitis of submandibular gland 12/16/2022      Dispostion: Admission     Final Clinical Impression(s) / ED Diagnoses  Final diagnoses:  Generalized abdominal pain  Portal vein thrombosis  Carcinomatosis (HCC)     @PCDICTATION @    Anders Simmonds T, DO 03/09/23 1331

## 2023-03-10 ENCOUNTER — Inpatient Hospital Stay (HOSPITAL_COMMUNITY): Payer: 59

## 2023-03-10 ENCOUNTER — Inpatient Hospital Stay: Payer: 59

## 2023-03-10 ENCOUNTER — Encounter (HOSPITAL_COMMUNITY): Payer: Self-pay | Admitting: Internal Medicine

## 2023-03-10 DIAGNOSIS — E876 Hypokalemia: Secondary | ICD-10-CM | POA: Diagnosis not present

## 2023-03-10 LAB — CBC WITH DIFFERENTIAL/PLATELET
Abs Immature Granulocytes: 0.17 10*3/uL — ABNORMAL HIGH (ref 0.00–0.07)
Basophils Absolute: 0.1 10*3/uL (ref 0.0–0.1)
Basophils Relative: 0 %
Eosinophils Absolute: 0.1 10*3/uL (ref 0.0–0.5)
Eosinophils Relative: 0 %
HCT: 30.3 % — ABNORMAL LOW (ref 36.0–46.0)
Hemoglobin: 9.8 g/dL — ABNORMAL LOW (ref 12.0–15.0)
Immature Granulocytes: 1 %
Lymphocytes Relative: 9 %
Lymphs Abs: 2.3 10*3/uL (ref 0.7–4.0)
MCH: 30.8 pg (ref 26.0–34.0)
MCHC: 32.3 g/dL (ref 30.0–36.0)
MCV: 95.3 fL (ref 80.0–100.0)
Monocytes Absolute: 1.5 10*3/uL — ABNORMAL HIGH (ref 0.1–1.0)
Monocytes Relative: 5 %
Neutro Abs: 23.1 10*3/uL — ABNORMAL HIGH (ref 1.7–7.7)
Neutrophils Relative %: 85 %
Platelets: 292 10*3/uL (ref 150–400)
RBC: 3.18 MIL/uL — ABNORMAL LOW (ref 3.87–5.11)
RDW: 16.7 % — ABNORMAL HIGH (ref 11.5–15.5)
WBC: 27.2 10*3/uL — ABNORMAL HIGH (ref 4.0–10.5)
nRBC: 0 % (ref 0.0–0.2)

## 2023-03-10 LAB — COMPREHENSIVE METABOLIC PANEL
ALT: 29 U/L (ref 0–44)
AST: 29 U/L (ref 15–41)
Albumin: 2.8 g/dL — ABNORMAL LOW (ref 3.5–5.0)
Alkaline Phosphatase: 191 U/L — ABNORMAL HIGH (ref 38–126)
Anion gap: 6 (ref 5–15)
BUN: 9 mg/dL (ref 6–20)
CO2: 28 mmol/L (ref 22–32)
Calcium: 8.5 mg/dL — ABNORMAL LOW (ref 8.9–10.3)
Chloride: 102 mmol/L (ref 98–111)
Creatinine, Ser: 0.48 mg/dL (ref 0.44–1.00)
GFR, Estimated: >60 mL/min (ref >60)
Glucose, Bld: 94 mg/dL (ref 70–99)
Potassium: 3.6 mmol/L (ref 3.5–5.1)
Sodium: 136 mmol/L (ref 135–145)
Total Bilirubin: 1.1 mg/dL (ref 0.0–1.2)
Total Protein: 6.7 g/dL (ref 6.5–8.1)

## 2023-03-10 LAB — HEPARIN LEVEL (UNFRACTIONATED): Heparin Unfractionated: 0.1 [IU]/mL — ABNORMAL LOW (ref 0.30–0.70)

## 2023-03-10 MED ORDER — ACETAMINOPHEN 325 MG PO TABS: 650.0000 mg | ORAL_TABLET | Freq: Four times a day (QID) | ORAL | Status: DC | PRN

## 2023-03-10 MED ORDER — HYDROMORPHONE HCL 2 MG PO TABS
4.0000 mg | ORAL_TABLET | ORAL | Status: DC | PRN
  Administered 2023-03-10: 4 mg via ORAL
  Filled 2023-03-10: qty 2

## 2023-03-10 MED ORDER — KCL-LACTATED RINGERS-D5W 20 MEQ/L IV SOLN
INTRAVENOUS | Status: AC
  Filled 2023-03-10 (×2): qty 1000

## 2023-03-10 MED ORDER — HEPARIN BOLUS VIA INFUSION
1800.0000 [IU] | Freq: Once | INTRAVENOUS | Status: AC
  Administered 2023-03-10: 1800 [IU] via INTRAVENOUS
  Filled 2023-03-10: qty 1800

## 2023-03-10 MED ORDER — KCL-LACTATED RINGERS-D5W 20 MEQ/L IV SOLN
INTRAVENOUS | Status: DC
  Filled 2023-03-10: qty 1000

## 2023-03-10 MED ORDER — ACETAMINOPHEN 500 MG PO TABS: 1000.0000 mg | ORAL_TABLET | Freq: Three times a day (TID) | ORAL | Status: DC

## 2023-03-10 MED ORDER — HYDROMORPHONE HCL 1 MG/ML IJ SOLN
1.0000 mg | Freq: Once | INTRAMUSCULAR | Status: AC
  Administered 2023-03-10: 1 mg via INTRAVENOUS
  Filled 2023-03-10: qty 1

## 2023-03-10 MED ORDER — HEPARIN BOLUS VIA INFUSION
2000.0000 [IU] | Freq: Once | INTRAVENOUS | Status: AC
  Administered 2023-03-10: 2000 [IU] via INTRAVENOUS
  Filled 2023-03-10: qty 2000

## 2023-03-10 MED ORDER — HYDROMORPHONE HCL 1 MG/ML IJ SOLN
1.0000 mg | INTRAMUSCULAR | Status: DC | PRN
  Administered 2023-03-10 – 2023-03-11 (×10): 2 mg via INTRAVENOUS
  Administered 2023-03-11: 1 mg via INTRAVENOUS
  Administered 2023-03-11 – 2023-03-14 (×14): 2 mg via INTRAVENOUS
  Filled 2023-03-10 (×26): qty 2

## 2023-03-10 MED ORDER — ACETAMINOPHEN 325 MG PO TABS
650.0000 mg | ORAL_TABLET | Freq: Four times a day (QID) | ORAL | Status: DC | PRN
Start: 2023-03-15 — End: 2023-03-14

## 2023-03-10 MED ORDER — HEPARIN (PORCINE) 25000 UT/250ML-% IV SOLN
1400.0000 [IU]/h | INTRAVENOUS | Status: DC
  Administered 2023-03-10: 1000 [IU]/h via INTRAVENOUS
  Administered 2023-03-11: 1400 [IU]/h via INTRAVENOUS
  Filled 2023-03-10 (×2): qty 250

## 2023-03-10 MED ORDER — ACETAMINOPHEN 500 MG PO TABS
1000.0000 mg | ORAL_TABLET | Freq: Three times a day (TID) | ORAL | Status: DC
  Administered 2023-03-10 – 2023-03-14 (×11): 1000 mg via ORAL
  Filled 2023-03-10 (×11): qty 2

## 2023-03-10 NOTE — Progress Notes (Signed)
   03/10/23 1119  Assess: MEWS Score  Temp (!) 102.6 F (39.2 C)  BP (!) 138/96  MAP (mmHg) 109  Pulse Rate (!) 122  Resp 20  SpO2 94 %  O2 Device Room Air  Assess: MEWS Score  MEWS Temp 2  MEWS Systolic 0  MEWS Pulse 2  MEWS RR 0  MEWS LOC 0  MEWS Score 4  MEWS Score Color Red  Assess: if the MEWS score is Yellow or Red  Were vital signs accurate and taken at a resting state? Yes  Does the patient meet 2 or more of the SIRS criteria? Yes  Does the patient have a confirmed or suspected source of infection? No  MEWS guidelines implemented  Yes, red  Treat  MEWS Interventions Considered administering scheduled or prn medications/treatments as ordered  Take Vital Signs  Increase Vital Sign Frequency  Red: Q1hr x2, continue Q4hrs until patient remains green for 12hrs  Escalate  MEWS: Escalate Red: Discuss with charge nurse and notify provider. Consider notifying RRT. If remains red for 2 hours consider need for higher level of care  Notify: Charge Nurse/RN  Name of Charge Nurse/RN Notified [facility]  Provider Notification  Provider Name/Title Dr. Lowell Guitar  Date Provider Notified 03/10/23  Time Provider Notified 1130  Method of Notification Face-to-face  Notification Reason Other (Comment) (Red mews)  Provider response See new orders  Date of Provider Response 03/10/23  Time of Provider Response 1132  Notify: Rapid Response  Name of Rapid Response RN Notified Ethan,RN  Date Rapid Response Notified 03/10/23  Time Rapid Response Notified 1158  Assess: SIRS CRITERIA  SIRS Temperature  1  SIRS Respirations  0  SIRS Pulse 1  SIRS WBC 1  SIRS Score Sum  3

## 2023-03-10 NOTE — Progress Notes (Addendum)
 April [name]   DOB:[birth year]   ZO#:[id number]      ASSESSMENT & PLAN:  1.  Severe abdominal pain Nausea/vomiting - Admitted via ED 03/09/2023 with complaints of severe abdominal pain - CT scan abdomen pelvis done 03/09/2023, nodular thickening in the liver hilum slightly increased from previous.  Indwelling biliary stents showed improving biliary ductal dilatation. -Patient admitted and discharged 02/27/2023 with same complaints.  ERCP with stent exchange was done on 02/26/2023. - Oral Dilaudid at home was prescribed as patient reported no relief from MS Contin or hydrocodone.  Continue pain management during this hospitalization. - Antiemetics as ordered - GI evaluation done, no intervention at this point. - Pending abdominal ultrasound.  Will follow results  2.  Sigmoid colon cancer, stage IV - Initially diagnosed 2021 - Status post FOLFIRI/bevacizumab and Udenyca given in 2021.  From 2022 to 2023 FOLFIRI with panitumumab was given.   -Maintenance Xeloda was given in 2022.  Lonsurf was given in 2023. Then in 2024 patient received FOLFOX therapy.   -Currently on salvage therapy with irinotecan/panitumumab.  Received cycle four 02/17/2023 and tolerated well.     -Seen in outpatient oncology on 03/03/2023.  Medical oncology/Dr. Truett Perna following closely.  She had an outpatient appointment scheduled for today which will be rescheduled upon discharge.  3.  Anemia - Likely due to malignancy and recent chemotherapy - Hemoglobin 9.8 - Transfuse PRBC for Hgb <7.0.  No transfusional intervention indicated at this time - Continue to monitor CBC with differential  4.  Leukocytosis - WBC elevated 27.2 - Continue IV antibiotics - Monitor fever curve  5.  Hypertension - Continue antihypertensives - Monitor BP levels  Code Status Full  Subjective:  Patient seen awake and alert laying in bed, reports pain started on Sunday as abdominal tightness on the right lower quadrant and then  on Monday she became nauseous and started vomiting.  Reports pain comes in waves.  Denies active bleeding shortness of breath.  Objective:  Vitals:   03/10/23 0454 03/10/23 0641  BP: (!) 130/93 122/87  Pulse: (!) 126 (!) 115  Resp: 16 19  Temp: 99.7 F (37.6 C) 99.6 F (37.6 C)  SpO2: 96% 97%     Intake/Output Summary (Last 24 hours) at 03/10/2023 1025 Last data filed at 03/10/2023 9811 Gross per 24 hour  Intake 3991.35 ml  Output --  Net 3991.35 ml     REVIEW OF SYSTEMS:   Constitutional: Denies fevers, chills or abnormal night sweats Eyes: Denies blurriness of vision, double vision or watery eyes Ears, nose, mouth, throat, and face: Denies mucositis or sore throat Respiratory: Denies cough, dyspnea or wheezes Cardiovascular: Denies palpitation, chest discomfort or lower extremity swelling Gastrointestinal: + Nausea, + abdominal pain Skin: Denies abnormal skin rashes Lymphatics: Denies new lymphadenopathy or easy bruising Neurological: Denies numbness, tingling or new weaknesses Behavioral/Psych: Mood is stable, no new changes  All other systems were reviewed with the patient and are negative.  PHYSICAL EXAMINATION: ECOG PERFORMANCE STATUS: 2 - Symptomatic, <50% confined to bed  Vitals:   03/10/23 0454 03/10/23 0641  BP: (!) 130/93 122/87  Pulse: (!) 126 (!) 115  Resp: 16 19  Temp: 99.7 F (37.6 C) 99.6 F (37.6 C)  SpO2: 96% 97%   Filed Weights   03/09/23 2042  Weight: 137 lb 9.1 oz (62.4 kg)    GENERAL: alert, +moderate distress  SKIN: skin color, texture, turgor are normal, acne type rash surrounding the nose EYES: normal, conjunctiva are pink and non-injected,  sclera clear OROPHARYNX: no exudate, no erythema and lips, buccal mucosa, and tongue normal  NECK: supple, thyroid normal size, non-tender, without nodularity LYMPH: no palpable lymphadenopathy in the cervical, axillary or inguinal LUNGS: clear to auscultation and percussion with normal breathing  effort HEART: regular rate & rhythm and no murmurs and no lower extremity edema ABDOMEN: abdomen soft, severe tenderness in the right mid and lower abdomen MUSCULOSKELETAL: no cyanosis of digits and no clubbing  PSYCH: alert & oriented x 3 with fluent speech NEURO: no focal motor/sensory deficits   All questions were answered. The patient knows to call the clinic with any problems, questions or concerns.   The total time spent in the appointment was 40 minutes encounter with patient including review of chart and various tests results, discussions about plan of care and coordination of care plan  Dawson Bills, NP 03/10/2023 10:25 AM    Labs Reviewed:  Lab Results  Component Value Date   WBC 27.2 (H) 03/10/2023   HGB 9.8 (L) 03/10/2023   HCT 30.3 (L) 03/10/2023   MCV 95.3 03/10/2023   PLT 292 03/10/2023   Recent Labs    03/27/22 1212 03/31/22 0945 11/21/22 0557 11/22/22 0710 12/16/22 0505 12/17/22 0500 03/03/23 0945 03/09/23 0943 03/09/23 0944 03/10/23 0348  NA  --    < > 138   < > 132*   < > 138 136 137 136  K 3.8   < > 3.5   < > 2.9*   < > 3.1* 2.8* 2.7* 3.6  CL  --    < > 101   < > 97*   < > 100 94* 96* 102  CO2  --    < > 24   < > 29   < > 27  --  28 28  GLUCOSE  --    < > 139*   < > 97   < > 122* 111* 110* 94  BUN  --    < > 9   < > 8   < > 8 4* 6 9  CREATININE  --    < > 0.58   < > 0.52   < > 0.52 0.50 0.43* 0.48  CALCIUM  --    < > 10.3   < > 8.5*   < > 8.9  --  9.4 8.5*  GFRNONAA  --    < > >60   < > >60   < > >60  --  >60 >60  PROT 6.8   < > 8.9*   < > 7.1   < > 6.8  --  8.0 6.7  ALBUMIN 3.2*   < > 4.4  4.5   < > 3.2*   < > 3.4*  --  3.6 2.8*  AST 78*   < > 35   < > 37   < > 14*  --  25 29  ALT 172*   < > 24   < > 28   < > 11  --  32 29  ALKPHOS 506*   < > 172*   < > 204*   < > 286*  --  216* 191*  BILITOT 2.3*   < > 0.6   < > 0.5   < > 0.2  --  0.6 1.1  BILIDIR 1.1*  --  <0.1  --  0.1  --   --   --   --   --   IBILI 1.2*  --  NOT CALCULATED  --  0.4  --    --   --   --   --    < > = values in this interval not displayed.    Studies Reviewed:  CT ABDOMEN PELVIS W CONTRAST Result Date: 03/09/2023 CLINICAL DATA:  Rectal cancer with liver metastases with abdominal pain. * Tracking Code: BO * EXAM: CT ABDOMEN AND PELVIS WITH CONTRAST TECHNIQUE: Multidetector CT imaging of the abdomen and pelvis was performed using the standard protocol following bolus administration of intravenous contrast. RADIATION DOSE REDUCTION: This exam was performed according to the departmental dose-optimization program which includes automated exposure control, adjustment of the mA and/or kV according to patient size and/or use of iterative reconstruction technique. CONTRAST:  OMNIPAQUE IOHEXOL 300 MG/ML  SOLN COMPARISON:  CT 02/24/2023. FINDINGS: Lower chest: There is some linear opacity lung bases likely scar or atelectasis. No pleural effusion. Hepatobiliary: There is areas of fatty liver infiltration particularly geographically along the right hepatic lobe more so than left. This is progressive from previous examination. Portal vein is patent grossly but somewhat narrowed by extrinsic compression. There are indwelling endoscopically placed stents. Level of intrahepatic biliary duct dilatation is slightly improving on the left and some on the right. The gallbladder continues to be dilated with some wall edema which is increasing. There is more nodular tissue extending up along the porta hepatis as best seen on coronal series 8, image 49. This has a differential including progression of disease. There are some areas of progressive portal vein occlusion such is anteriorly in the right hepatic lobe inferiorly best seen on coronal imaging. Pancreas: No pancreatic mass. Persistent pancreas ductal dilatation, similar to previous. Spleen: Normal in size without focal abnormality. Adrenals/Urinary Tract: Adrenal glands are preserved. Bilateral nonobstructing renal stones. No enhancing mass  or collecting system dilatation. Preserved contour to the urinary bladder. Areas of atrophy in the right kidney are again noted. Stomach/Bowel: On this non oral contrast exam large bowel has a normal course and caliber with scattered stool. There are loops of decompressed transverse colon. The small bowel is nondilated. Along the rectum once again is nodular wall thickening with extension into the adjacent tissue consistent with known history of neoplasm. Nodular areas are seen extending perirectal and presacral spaces. Vascular/Lymphatic: Normal caliber aorta and IVC. There is ectasia of the left common iliac artery with diameter approaching 19 mm. Scattered atherosclerotic calcified plaque. Several small less than 1 cm size in short axis retroperitoneal nodes are seen, not pathologic by size criteria. Reproductive: Uterus is present. Other: Peritoneal carcinomatosis again identified. Example measured previously anterior in the central upper abdomen with transverse dimension of 2.2 cm. Today this is measured 2.5 cm. Other areas are also similar including the pericolic gutters. There is also thickening along the surface of the liver towards the dome on the right side and laterally. Some these areas appear more prominent today than the recent prior this could be technical. Musculoskeletal: Curvature of the spine with some degenerative changes. 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 IMPRESSION: Again note is made of rectal carcinoma with peritoneal carcinomatosis. Trace ascites. The level of nodular thickening in the liver hilum appears slightly increased from previous. Indwelling biliary stents with some proving biliary ductal dilatation compared to the prior examination. However there is a more heterogeneous appearance of the liver with more periportal edema and areas of potential developing portal vein peripheral branch thrombus particularly along the inferior aspect of the liver. Bilateral  nonobstructing renal  stones.  Atrophy of the right kidney. Critical Value/emergent results were called by telephone at the time of interpretation on 03/09/2023 at 11:28 am to provider [facility] , who verbally acknowledged these results. Electronically Signed   By: Karen Kays M.D.   On: 03/09/2023 11:49   DG Chest Port 1 View Result Date: 03/09/2023 CLINICAL DATA:  48 year old female with possible sepsis. Metastatic rectal cancer. EXAM: PORTABLE CHEST 1 VIEW COMPARISON:  CT Chest, Abdomen, and Pelvis 01/24/2023 and earlier. FINDINGS: Portable AP semi upright view at 0949 hours. Lung volumes and mediastinal contours remain normal. Left side PICC line remains in place. Visualized tracheal air column is within normal limits. Allowing for portable technique the lungs are clear. No pneumothorax or pleural effusion. No evidence of pneumoperitoneum. Negative visible bowel gas. No acute osseous abnormality identified. IMPRESSION: No acute cardiopulmonary abnormality. Electronically Signed   By: Odessa Fleming M.D.   On: 03/09/2023 10:23   DG ERCP Result Date: 02/27/2023 CLINICAL DATA:  Encounter for removal of biliary stent. EXAM: ERCP TECHNIQUE: Multiple spot images obtained with the fluoroscopic device and submitted for interpretation post-procedure. FLUOROSCOPY: Radiation Exposure Index (as provided by the fluoroscopic device): 106.44 mGy Kerma COMPARISON:  CT 02/24/2023 FINDINGS: Initial image demonstrates 2 plastic biliary stents. Biliary stents removed. Retrograde cholangiogram was performed. Cholangiogram images demonstrate narrowing and irregularity near the biliary confluence and involving the left and right hepatic ducts. Wires were advanced into the biliary ducts. Evidence for balloon dilatation of the biliary ducts. Final images demonstrate placement of 2 new plastic biliary stents presumably in the left and right hepatic ducts. IMPRESSION: 1. Irregularity and stenosis involving the left and right hepatic  ducts near the biliary confluence. These areas were dilated and new biliary stents were placed. These images were submitted for radiologic interpretation only. Please see the procedural report. Electronically Signed   By: Richarda Overlie M.D.   On: 02/27/2023 09:03   CT ABDOMEN PELVIS W CONTRAST Result Date: 02/24/2023 CLINICAL DATA:  Abdominal pain EXAM: CT ABDOMEN AND PELVIS WITH CONTRAST TECHNIQUE: Multidetector CT imaging of the abdomen and pelvis was performed using the standard protocol following bolus administration of intravenous contrast. RADIATION DOSE REDUCTION: This exam was performed according to the departmental dose-optimization program which includes automated exposure control, adjustment of the mA and/or kV according to patient size and/or use of iterative reconstruction technique. CONTRAST:  75mL OMNIPAQUE IOHEXOL 300 MG/ML  SOLN COMPARISON:  01/24/2023 FINDINGS: Lower chest: No acute abnormality. Hepatobiliary: Redemonstration of subtly hypoattenuating lesion within the peripheral aspect of hepatic segment 2 (series 2, image 20). Additional scattered subcentimeter low-density lesions within the liver remain too small to characterize. Prominent intrahepatic biliary dilatation with biliary stents in place. Slightly hyperattenuating material fills the gallbladder. No pericholecystic inflammatory changes by CT. Pancreas: Stable mild pancreatic ductal dilatation. No inflammatory changes. Spleen: Normal in size without focal abnormality. Adrenals/Urinary Tract: Unremarkable adrenal glands. Stable bilateral nephrolithiasis with stones measuring up to 1.1 cm on the left and 0.9 cm on the right. No hydronephrosis. Urinary bladder within normal limits for the degree of distension. Stomach/Bowel: Stomach is within normal limits. Appendix appears normal. Similar degree of wall thickening of the rectosigmoid colon. No new sites of bowel wall thickening or inflammation. Vascular/Lymphatic: Scattered aortoiliac  atherosclerotic calcifications without aneurysm. No abdominopelvic lymphadenopathy. Reproductive: Uterus and bilateral adnexa are unremarkable. Other: Similar degree of nodularity along the omentum and left pericolic gutter. No ascites or pneumoperitoneum. Musculoskeletal: No lytic or sclerotic bone lesion. Grade 2 anterolisthesis of  L5 on S1 secondary to chronic bilateral L5 pars interarticularis defects. IMPRESSION: 1. No new or acute abdominopelvic findings. 2. 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 3. Prominent intrahepatic biliary dilatation with biliary stents in place. 4. Stable bilateral nephrolithiasis. 5. Aortic atherosclerosis (ICD10-I70.0). Electronically Signed   By: Duanne Guess D.O.   On: 02/24/2023 18:49   Ms. Addison was interviewed and examined.  She presents with acute right abdominal pain.  She has profound tenderness in the right greater than left abdomen on exam today.  I reviewed the CT findings and images.  It is possible the pain is related to progression of the metastatic colon cancer, but I am concerned the patient could be related to gallbladder inflammation.  She has a fever and leukocytosis.  She has not received GCSF.  Ms. Riede has metastatic colon cancer.  She is currently being treated with irinotecan/panitumumab.  She was scheduled for chemotherapy today.  Treatment will be placed on hold.  Recommendations: Continue antibiotics, follow-up cultures Consider additional imaging and surgical consultation to evaluate for cholecystitis Narcotic analgesics as needed for pain Oncology will continue following her in the hospital and outpatient follow-up will be scheduled [facility]

## 2023-03-10 NOTE — Progress Notes (Signed)
 PHARMACY - ANTICOAGULATION CONSULT NOTE  Pharmacy Consult for heparin Indication: portal vein thrombus  Allergies  Allergen Reactions   Lisinopril Swelling and Other (See Comments)    Lip swelling and Angioedema   Irinotecan Other (See Comments)    Stomach cramping Patient given 0.5 mL atropine IV and was able to continue infusion.  See Progress note on 10/03/20.    Leucovorin Calcium Itching    Patient reported itchy palms towards end of transfusion, see progress note 08/04/22.    Oxaliplatin Itching    Patient reported itchy palms towards end of transfusion, see progress note on 08/04/22.    Patient Measurements: Height: 5\' 4"  (162.6 cm) Weight: 62.4 kg (137 lb 9.1 oz) IBW/kg (Calculated) : 54.7 Heparin Dosing Weight: 62.4 kg  Vital Signs: Temp: 102.6 F (39.2 C) (02/25 1119) Temp Source: Oral (02/25 1119) BP: 138/96 (02/25 1119) Pulse Rate: 122 (02/25 1119)  Labs: Recent Labs    03/09/23 0943 03/09/23 0944 03/10/23 0348  HGB 13.3 12.1 9.8*  HCT 39.0 37.7 30.3*  PLT  --  387 292  APTT  --  33  --   LABPROT  --  13.9  --   INR  --  1.1  --   CREATININE 0.50 0.43* 0.48    Estimated Creatinine Clearance: 73.5 mL/min (by C-G formula based on SCr of 0.48 mg/dL).   Medical History: Past Medical History:  Diagnosis Date   Ascending aortic aneurysm (HCC) 12/16/2022   Benign essential hypertension 10/06/2017   Last Assessment & Plan:  Formatting of this note might be different from the original. Patient's blood pressure noted to be elevated at today's visit 188/98.  Patient will be provided with antihypertensives in the treatment center if needed in order to meet her Avastin parameters.  Patient recommended to follow up with her primary care physician regarding medication management.  Patient is not cur   Cancer (HCC)    Change in bowel habits 06/28/2022   Colon cancer (HCC)    History of colon cancer 06/28/2022   Hypertension    Prolonged QT interval 01/07/2018    Renal calculi    bilateral   Sepsis due to undetermined organism (HCC) 12/24/2022   Sialoadenitis of submandibular gland 12/16/2022    Medications:  Medications Prior to Admission  Medication Sig Dispense Refill Last Dose/Taking   amLODipine (NORVASC) 10 MG tablet Take 1 tablet (10 mg total) by mouth daily. 90 tablet 0 Past Week   dexamethasone (DECADRON) 4 MG tablet Take 1 tablet by mouth twice a day for 3 days beginning day 2 of chemo. 12 tablet 2 Past Month   dicyclomine (BENTYL) 20 MG tablet Take 1 tablet (20 mg total) by mouth 3 (three) times daily as needed for spasms (rectal/AB pain). (Patient taking differently: Take 20 mg by mouth 2 (two) times daily as needed for spasms (rectal/AB pain).) 60 tablet 1 03/04/2023   HYDROmorphone (DILAUDID) 2 MG tablet Take 1 tablet (2 mg total) by mouth every 6 (six) hours as needed for severe pain (pain score 7-10). (Patient taking differently: Take 4 mg by mouth 3 (three) times daily as needed for severe pain (pain score 7-10).) 30 tablet 0 03/08/2023   lactulose (CHRONULAC) 10 GM/15ML solution Take 15-30 mLs by mouth daily as needed for mild constipation.   Taking As Needed   linaclotide (LINZESS) 72 MCG capsule Take 72 mcg by mouth daily as needed (constipation).   03/08/2023   metoprolol tartrate (LOPRESSOR) 50 MG tablet Take 1 tablet (  50 mg total) by mouth 2 (two) times daily. 180 tablet 0 03/04/2023   pantoprazole (PROTONIX) 40 MG tablet Take 1 tablet (40 mg total) by mouth daily before breakfast. 30 tablet 6 03/04/2023   polyethylene glycol (MIRALAX / GLYCOLAX) 17 g packet Take 17 g by mouth daily as needed. (Patient taking differently: Take 17 g by mouth daily as needed for moderate constipation or mild constipation.) 14 each 0 Taking Differently   potassium chloride (MICRO-K) 10 MEQ CR capsule Take 2 capsules (20 mEq total) by mouth 2 (two) times daily. (Patient taking differently: Take 30 mEq by mouth 2 (two) times daily.) 120 capsule 2 03/04/2023    prochlorperazine (COMPAZINE) 10 MG tablet Take 1 tablet (10 mg total) by mouth every 6 (six) hours as needed for nausea or vomiting. 30 tablet 2 Past Month   doxycycline (ADOXA) 100 MG tablet Take 100 mg by mouth 2 (two) times daily. (Patient not taking: Reported on 03/09/2023)   Not Taking   magic mouthwash (nystatin, diphenhydrAMINE, alum & mag hydroxide) suspension mixture Swish and Spit 5 mLs by mouth 4 (four) times daily as needed. (Patient not taking: Reported on 02/17/2023) 240 mL 1 Not Taking   morphine (MS CONTIN) 30 MG 12 hr tablet Take 30 mg by mouth every 12 (twelve) hours. (Patient not taking: Reported on 03/09/2023)   Not Taking   ondansetron (ZOFRAN) 8 MG tablet Take 1 tablet (8 mg total) by mouth every 8 (eight) hours as needed for nausea or vomiting. Can start taking 72 hours after chemotherapy (Patient not taking: Reported on 03/09/2023) 30 tablet 1 Not Taking    Assessment: 48 yo F with stage IV colon cancer.  Pharmacy consulted to dose heparin for developing Portal Vein Peripheral Branch Thrombus.  Hg 12.1> 9.8, PLT WNL, SCr WNL  Goal of Therapy:  Heparin level 0.3-0.7 units/ml Monitor platelets by anticoagulation protocol: Yes   Plan:  DC LMWH 40  Heparin 2000 unit bolus IV x 1 Heparin drip @ 1000 units/hr Check 6 hour heparin level Daily heparin level & CBC  Herby Abraham, Pharm.D Use secure chat for questions 03/10/2023 11:48 AM

## 2023-03-10 NOTE — Progress Notes (Signed)
 PHARMACY - ANTICOAGULATION CONSULT NOTE  Pharmacy Consult for heparin Indication: portal vein thrombus  Allergies  Allergen Reactions   Lisinopril Swelling and Other (See Comments)    Lip swelling and Angioedema   Irinotecan Other (See Comments)    Stomach cramping Patient given 0.5 mL atropine IV and was able to continue infusion.  See Progress note on 10/03/20.    Leucovorin Calcium Itching    Patient reported itchy palms towards end of transfusion, see progress note 08/04/22.    Oxaliplatin Itching    Patient reported itchy palms towards end of transfusion, see progress note on 08/04/22.    Patient Measurements: Height: 5\' 4"  (162.6 cm) Weight: 62.4 kg (137 lb 9.1 oz) IBW/kg (Calculated) : 54.7 Heparin Dosing Weight: 62.4 kg  Vital Signs: Temp: 98.2 F (36.8 C) (02/25 2008) Temp Source: Oral (02/25 2008) BP: 123/88 (02/25 2008) Pulse Rate: 107 (02/25 2008)  Labs: Recent Labs    03/09/23 0943 03/09/23 0944 03/10/23 0348 03/10/23 2021  HGB 13.3 12.1 9.8*  --   HCT 39.0 37.7 30.3*  --   PLT  --  387 292  --   APTT  --  33  --   --   LABPROT  --  13.9  --   --   INR  --  1.1  --   --   HEPARINUNFRC  --   --   --  0.10*  CREATININE 0.50 0.43* 0.48  --     Estimated Creatinine Clearance: 73.5 mL/min (by C-G formula based on SCr of 0.48 mg/dL).   Medical History: Past Medical History:  Diagnosis Date   Ascending aortic aneurysm (HCC) 12/16/2022   Benign essential hypertension 10/06/2017   Last Assessment & Plan:  Formatting of this note might be different from the original. Patient's blood pressure noted to be elevated at today's visit 188/98.  Patient will be provided with antihypertensives in the treatment center if needed in order to meet her Avastin parameters.  Patient recommended to follow up with her primary care physician regarding medication management.  Patient is not cur   Cancer (HCC)    Change in bowel habits 06/28/2022   Colon cancer (HCC)     History of colon cancer 06/28/2022   Hypertension    Prolonged QT interval 01/07/2018   Renal calculi    bilateral   Sepsis due to undetermined organism (HCC) 12/24/2022   Sialoadenitis of submandibular gland 12/16/2022    Medications:  Medications Prior to Admission  Medication Sig Dispense Refill Last Dose/Taking   amLODipine (NORVASC) 10 MG tablet Take 1 tablet (10 mg total) by mouth daily. 90 tablet 0 Past Week   dexamethasone (DECADRON) 4 MG tablet Take 1 tablet by mouth twice a day for 3 days beginning day 2 of chemo. 12 tablet 2 Past Month   dicyclomine (BENTYL) 20 MG tablet Take 1 tablet (20 mg total) by mouth 3 (three) times daily as needed for spasms (rectal/AB pain). (Patient taking differently: Take 20 mg by mouth 2 (two) times daily as needed for spasms (rectal/AB pain).) 60 tablet 1 03/04/2023   HYDROmorphone (DILAUDID) 2 MG tablet Take 1 tablet (2 mg total) by mouth every 6 (six) hours as needed for severe pain (pain score 7-10). (Patient taking differently: Take 4 mg by mouth 3 (three) times daily as needed for severe pain (pain score 7-10).) 30 tablet 0 03/08/2023   lactulose (CHRONULAC) 10 GM/15ML solution Take 15-30 mLs by mouth daily as needed for mild constipation.  Taking As Needed   linaclotide (LINZESS) 72 MCG capsule Take 72 mcg by mouth daily as needed (constipation).   03/08/2023   metoprolol tartrate (LOPRESSOR) 50 MG tablet Take 1 tablet (50 mg total) by mouth 2 (two) times daily. 180 tablet 0 03/04/2023   pantoprazole (PROTONIX) 40 MG tablet Take 1 tablet (40 mg total) by mouth daily before breakfast. 30 tablet 6 03/04/2023   polyethylene glycol (MIRALAX / GLYCOLAX) 17 g packet Take 17 g by mouth daily as needed. (Patient taking differently: Take 17 g by mouth daily as needed for moderate constipation or mild constipation.) 14 each 0 Taking Differently   potassium chloride (MICRO-K) 10 MEQ CR capsule Take 2 capsules (20 mEq total) by mouth 2 (two) times daily. (Patient  taking differently: Take 30 mEq by mouth 2 (two) times daily.) 120 capsule 2 03/04/2023   prochlorperazine (COMPAZINE) 10 MG tablet Take 1 tablet (10 mg total) by mouth every 6 (six) hours as needed for nausea or vomiting. 30 tablet 2 Past Month   doxycycline (ADOXA) 100 MG tablet Take 100 mg by mouth 2 (two) times daily. (Patient not taking: Reported on 03/09/2023)   Not Taking   magic mouthwash (nystatin, diphenhydrAMINE, alum & mag hydroxide) suspension mixture Swish and Spit 5 mLs by mouth 4 (four) times daily as needed. (Patient not taking: Reported on 02/17/2023) 240 mL 1 Not Taking   morphine (MS CONTIN) 30 MG 12 hr tablet Take 30 mg by mouth every 12 (twelve) hours. (Patient not taking: Reported on 03/09/2023)   Not Taking   ondansetron (ZOFRAN) 8 MG tablet Take 1 tablet (8 mg total) by mouth every 8 (eight) hours as needed for nausea or vomiting. Can start taking 72 hours after chemotherapy (Patient not taking: Reported on 03/09/2023) 30 tablet 1 Not Taking    Assessment: 48 yo F with stage IV colon cancer. Pharmacy consulted to dose heparin for developing Portal Vein Peripheral Branch Thrombus.  Hg 12.1> 9.8, PLT WNL, SCr WNL  Heparin level 0.10 - subtherapeutic on heparin infusion at 1000 units/hr No complications of therapy noted  Goal of Therapy:  Heparin level 0.3-0.7 units/ml Monitor platelets by anticoagulation protocol: Yes   Plan:  -Heparin 1800 unit bolus -Increase heparin infusion to 1200 units/hr -Check 6 hour heparin level -Daily heparin level & CBC  Pricilla Riffle, PharmD, BCPS Clinical Pharmacist 03/10/2023 8:52 PM

## 2023-03-10 NOTE — Progress Notes (Signed)
 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  Positive SIRS criteria.  Currently on abx.  GI was consulted.  Oncology following.  See below for additional details.  Assessment & Plan:   Principal Problem:   Hypokalemia Active Problems:   Hypertension   Prolonged QT interval   Metastatic colon cancer to liver (HCC)   Nausea and vomiting   SIRS (systemic inflammatory response syndrome) (HCC)   Abdominal pain, chronic, right upper quadrant   Portal vein thrombosis   Fever  Systemic Inflammatory Response Syndrome Right Lower Quadrant Abdominal Pain  Meets criteria for sepsis, but no currently identified source (suspect intraabdominal with RLQ pain) Workup ongoing Blood cx no growth to date UA not c/w UTI  CT abd/pelvis with rectal carcinoma with peritoneal carcinomatosis, trace ascites - increased nodular thickening in liver hilum - indwelling biliary stents - heterogenous appearance of liver with more periportal edema and areas of potential developing peripheral branch thrombus Will repeat CT abd/pelvis today Continue broad abx GI recommended continued infectious workup, no indication for biliary intervention  Cancer Related Pain  Nausea  Vomiting  Abdominal Pain On 4 mg dilaudid PO prn at home Will ask for palliative assistance with significant chronic opiate need Exacerbation due to above process, currently being worked up Symptomatic management  History of Biliary Obstruction s/p ERCP with Stenting S/p ERCP 02/26/2023 -> 2 moderate biliary strictures seen in hepatic duct system, malignant appearing -> plastic biliary stent  placed into L hepatic duct and right hepatic duct. (See 2/13 report) GI notes no need for biliary intervention  Stage IV Colon Cancer Follow's with Dr. Truett Perna outpatient Appreciate oncology assistance inpatient  Developing Portal Vein Peripheral Branch Thrombus Heparin gtt  Prolonged Qtc Caution with qt prolonging meds Repeat EKG  PICC in place    DVT prophylaxis: lovenox Code Status: full Family Communication: none Disposition:   Status is: Inpatient Remains inpatient appropriate because: need for inpatient care   Consultants:  Oncology Palliative care GI  Procedures:  none  Antimicrobials:  Anti-infectives (From admission, onward)    Start     Dose/Rate Route Frequency Ordered Stop   03/10/23 0000  metroNIDAZOLE (FLAGYL) IVPB 500 mg        500 mg 100 mL/hr over 60 Minutes Intravenous Every 12 hours 03/09/23 1432 03/16/23 2359   03/09/23 2000  ceFEPIme (MAXIPIME) 2 g in sodium chloride 0.9 % 100 mL IVPB        2 g 200 mL/hr over 30 Minutes Intravenous Every 8 hours 03/09/23 1432     03/09/23 1100  piperacillin-tazobactam (ZOSYN) IVPB 3.375 g        3.375 g 100 mL/hr over 30 Minutes Intravenous  Once 03/09/23 1059 03/09/23 1217       Subjective: C/o abdominal pain RLQ Different than her chronic pain   Objective: Vitals:   03/10/23 0054 03/10/23 0054 03/10/23 0454 03/10/23 0641  BP: (!) 120/90  (!) 130/93 122/87  Pulse: (!) 114 (!) 109 (!) 126 (!) 115  Resp: 18  16 19   Temp: 99.7 F (37.6 C)  99.7 F (37.6 C) 99.6 F (37.6 C)  TempSrc: Oral  Oral Oral  SpO2: 98% 98% 96% 97%  Weight:      Height:        Intake/Output Summary (Last 24 hours) at 03/10/2023 1113 Last data filed at 03/10/2023 0324 Gross per 24 hour  Intake 3991.35 ml  Output --  Net 3991.35 ml   Filed Weights   03/09/23 2042  Weight: 62.4 kg    Examination:  General exam: visibly uncomfortable Respiratory system: unlabored Cardiovascular system: RRR Gastrointestinal  system: RLQ TTP, soft, mildly distended  Central nervous system: Alert and oriented. No focal neurological deficits. Extremities: no LEE   Data Reviewed: I have personally reviewed following labs and imaging studies  CBC: Recent Labs  Lab 03/09/23 0943 03/09/23 0944 03/10/23 0348  WBC  --  16.5* 27.2*  NEUTROABS  --  12.9* 23.1*  HGB 13.3 12.1 9.8*  HCT 39.0 37.7 30.3*  MCV  --  94.3 95.3  PLT  --  387 292    Basic Metabolic Panel: Recent Labs  Lab 03/09/23 0943 03/09/23 0944 03/10/23 0348  NA 136 137 136  K 2.8* 2.7* 3.6  CL 94* 96* 102  CO2  --  28 28  GLUCOSE 111* 110* 94  BUN 4* 6 9  CREATININE 0.50 0.43* 0.48  CALCIUM  --  9.4 8.5*  MG  --  1.7  --   PHOS  --  4.1  --     GFR: Estimated Creatinine Clearance: 73.5 mL/min (by C-G formula based on SCr of 0.48 mg/dL).  Liver Function Tests: Recent Labs  Lab 03/09/23 0944 03/10/23 0348  AST 25 29  ALT 32 29  ALKPHOS 216* 191*  BILITOT 0.6 1.1  PROT 8.0 6.7  ALBUMIN 3.6 2.8*    CBG: No results for input(s): "GLUCAP" in the last 168 hours.   Recent Results (from the past 240 hours)  Blood Culture (routine x 2)     Status: None (Preliminary result)   Collection Time: 03/09/23  9:43 AM   Specimen: BLOOD  Result Value Ref Range Status   Specimen Description   Final    BLOOD RIGHT ANTECUBITAL Performed at [facility], [address] [place], [place] [address]    Special Requests   Final    BOTTLES DRAWN AEROBIC AND ANAEROBIC Blood Culture adequate volume Performed at [facility], [address], [place], [place] [address]    Culture   Final    NO GROWTH < 24 HOURS Performed at [facility], [address] [place], [place] [address]    Report Status PENDING  Incomplete  Resp panel by RT-PCR (RSV, Flu Finneus Kaneshiro&B, Covid) Anterior Nasal Swab     Status: None   Collection Time: 03/09/23  9:44 AM   Specimen: Anterior Nasal Swab  Result Value Ref Range Status    SARS Coronavirus 2 by RT PCR NEGATIVE NEGATIVE Final    Comment: (NOTE) SARS-CoV-2 target nucleic acids are NOT DETECTED.  The SARS-CoV-2 RNA is generally detectable in upper respiratory specimens during the acute phase of infection. The lowest concentration of SARS-CoV-2 viral copies this assay can detect is 138 copies/mL. Devoiry Corriher negative result does not preclude SARS-Cov-2 infection and should not be used as the sole basis for treatment or other patient management decisions. Avarie Tavano negative result may occur with  improper specimen collection/handling, submission of specimen other than nasopharyngeal swab, presence of viral mutation(s) within the areas targeted by this assay, and inadequate number of viral copies(<138 copies/mL). Wafa Martes negative result must be  combined with clinical observations, patient history, and epidemiological information. The expected result is Negative.  Fact Sheet for Patients:  BloggerCourse.com  Fact Sheet for Healthcare Providers:  SeriousBroker.it  This test is no t yet approved or cleared by the [place] FDA and  has been authorized for detection and/or diagnosis of SARS-CoV-2 by FDA under an Emergency Use Authorization (EUA). This EUA will remain  in effect (meaning this test can be used) for the duration of the COVID-19 declaration under Section 564(b)(1) of the Act, 21 U.S.C.section 360bbb-3(b)(1), unless the authorization is terminated  or revoked sooner.       Influenza Parul Porcelli by PCR NEGATIVE NEGATIVE Final   Influenza B by PCR NEGATIVE NEGATIVE Final    Comment: (NOTE) The Xpert Xpress SARS-CoV-2/FLU/RSV plus assay is intended as an aid in the diagnosis of influenza from Nasopharyngeal swab specimens and should not be used as Alailah Safley sole basis for treatment. Nasal washings and aspirates are unacceptable for Xpert Xpress SARS-CoV-2/FLU/RSV testing.  Fact Sheet for  Patients: BloggerCourse.com  Fact Sheet for Healthcare Providers: SeriousBroker.it  This test is not yet approved or cleared by the [place] FDA and has been authorized for detection and/or diagnosis of SARS-CoV-2 by FDA under an Emergency Use Authorization (EUA). This EUA will remain in effect (meaning this test can be used) for the duration of the COVID-19 declaration under Section 564(b)(1) of the Act, 21 U.S.C. section 360bbb-3(b)(1), unless the authorization is terminated or revoked.     Resp Syncytial Virus by PCR NEGATIVE NEGATIVE Final    Comment: (NOTE) Fact Sheet for Patients: BloggerCourse.com  Fact Sheet for Healthcare Providers: SeriousBroker.it  This test is not yet approved or cleared by the [place] FDA and has been authorized for detection and/or diagnosis of SARS-CoV-2 by FDA under an Emergency Use Authorization (EUA). This EUA will remain in effect (meaning this test can be used) for the duration of the COVID-19 declaration under Section 564(b)(1) of the Act, 21 U.S.C. section 360bbb-3(b)(1), unless the authorization is terminated or revoked.  Performed at [facility], [address], [place], [place] [address]   Blood Culture (routine x 2)     Status: None (Preliminary result)   Collection Time: 03/09/23 11:48 AM   Specimen: BLOOD RIGHT HAND  Result Value Ref Range Status   Specimen Description   Final    BLOOD RIGHT HAND Performed at [facility], [address], [place], [place] [address]    Special Requests   Final    BOTTLES DRAWN AEROBIC AND ANAEROBIC Blood Culture adequate volume Performed at [facility], [address] [place], [place] [address]    Culture   Final    NO GROWTH < 24 HOURS Performed at [facility], [address], [place], [place] [address]    Report Status PENDING   Incomplete         Radiology Studies: CT ABDOMEN PELVIS W CONTRAST Result Date: 03/09/2023 CLINICAL DATA:  Rectal cancer with liver metastases with abdominal pain. * Tracking Code: BO * EXAM: CT ABDOMEN AND PELVIS WITH CONTRAST TECHNIQUE: Multidetector CT imaging of the abdomen and pelvis was performed using the standard protocol following bolus administration of intravenous contrast. RADIATION DOSE REDUCTION: This exam was performed according to the departmental dose-optimization program which includes automated exposure control, adjustment of the mA and/or kV according to patient size and/or use of iterative reconstruction technique. CONTRAST:  OMNIPAQUE IOHEXOL 300 MG/ML  SOLN COMPARISON:  CT 02/24/2023. FINDINGS: Lower chest: There is some linear opacity lung bases likely scar or atelectasis. No pleural effusion. Hepatobiliary: There is areas of fatty liver infiltration particularly geographically along the right hepatic lobe more so than left. This is progressive from previous examination. Portal vein is patent grossly but somewhat narrowed by extrinsic compression. There are indwelling endoscopically placed stents. Level of intrahepatic biliary duct dilatation is slightly improving on the left and some on the right. The gallbladder continues to be dilated with some wall edema which is increasing. There is more nodular tissue extending up along the porta hepatis as best seen on coronal series 8, image 49. This has Eriyonna Matsushita differential including progression of disease. There are some areas of progressive portal vein occlusion such is anteriorly in the right hepatic lobe inferiorly best seen on coronal imaging. Pancreas: No pancreatic mass. Persistent pancreas ductal dilatation, similar to previous. Spleen: Normal in size without focal abnormality. Adrenals/Urinary Tract: Adrenal glands are preserved. Bilateral nonobstructing renal stones. No enhancing mass or collecting system dilatation. Preserved  contour to the urinary bladder. Areas of atrophy in the right kidney are again noted. Stomach/Bowel: On this non oral contrast exam large bowel has Alaine Loughney normal course and caliber with scattered stool. There are loops of decompressed transverse colon. The small bowel is nondilated. Along the rectum once again is nodular wall thickening with extension into the adjacent tissue consistent with known history of neoplasm. Nodular areas are seen extending perirectal and presacral spaces. Vascular/Lymphatic: Normal caliber aorta and IVC. There is ectasia of the left common iliac artery with diameter approaching 19 mm. Scattered atherosclerotic calcified plaque. Several small less than 1 cm size in short axis retroperitoneal nodes are seen, not pathologic by size criteria. Reproductive: Uterus is present. Other: Peritoneal carcinomatosis again identified. Example measured previously anterior in the central upper abdomen with transverse dimension of 2.2 cm. Today this is measured 2.5 cm. Other areas are also similar including the pericolic gutters. There is also thickening along the surface of the liver towards the dome on the right side and laterally. Some these areas appear more prominent today than the recent prior this could be technical. Musculoskeletal: Curvature of the spine with some degenerative changes. 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 IMPRESSION: Again note is made of rectal carcinoma with peritoneal carcinomatosis. Trace ascites. The level of nodular thickening in the liver hilum appears slightly increased from previous. Indwelling biliary stents with some proving biliary ductal dilatation compared to the prior examination. However there is Shanavia Makela more heterogeneous appearance of the liver with more periportal edema and areas of potential developing portal vein peripheral branch thrombus particularly along the inferior aspect of the liver. Bilateral nonobstructing renal stones.  Atrophy of the right  kidney. Critical Value/emergent results were called by telephone at the time of interpretation on 03/09/2023 at 11:28 am to provider [facility] , who verbally acknowledged these results. Electronically Signed   By: Karen Kays M.D.   On: 03/09/2023 11:49   DG Chest Port 1 View Result Date: 03/09/2023 CLINICAL DATA:  48 year old female with possible sepsis. Metastatic rectal cancer. EXAM: PORTABLE CHEST 1 VIEW COMPARISON:  CT Chest, Abdomen, and Pelvis 01/24/2023 and earlier. FINDINGS: Portable AP semi upright view at 0949 hours. Lung volumes and mediastinal contours remain normal. Left side PICC line remains in place. Visualized tracheal air column is within normal limits. Allowing for portable technique the lungs are clear. No pneumothorax or pleural effusion. No evidence of pneumoperitoneum. Negative visible bowel gas.  No acute osseous abnormality identified. IMPRESSION: No acute cardiopulmonary abnormality. Electronically Signed   By: Odessa Fleming M.D.   On: 03/09/2023 10:23        Scheduled Meds:  Chlorhexidine Gluconate Cloth  6 each Topical Daily   enoxaparin (LOVENOX) injection  40 mg Subcutaneous Q24H   sodium chloride flush  10-40 mL Intracatheter Q12H   Continuous Infusions:  ceFEPime (MAXIPIME) IV 2 g (03/10/23 0430)   metronidazole 500 mg (03/09/23 2309)     LOS: 1 day    Time spent: over 30 min    Lacretia Nicks, MD [facility]   To contact the attending provider between 7A-7P or the covering provider during after hours 7P-7A, please log into the web site [email or web address] and access using universal [place] password for that web site. If you do not have the password, please call the hospital operator.  03/10/2023, 11:13 AM

## 2023-03-11 ENCOUNTER — Inpatient Hospital Stay (HOSPITAL_COMMUNITY): Payer: 59

## 2023-03-11 DIAGNOSIS — Z7189 Other specified counseling: Secondary | ICD-10-CM

## 2023-03-11 DIAGNOSIS — Z79899 Other long term (current) drug therapy: Secondary | ICD-10-CM

## 2023-03-11 DIAGNOSIS — G893 Neoplasm related pain (acute) (chronic): Secondary | ICD-10-CM

## 2023-03-11 DIAGNOSIS — C800 Disseminated malignant neoplasm, unspecified: Secondary | ICD-10-CM

## 2023-03-11 DIAGNOSIS — Z789 Other specified health status: Secondary | ICD-10-CM

## 2023-03-11 DIAGNOSIS — K810 Acute cholecystitis: Secondary | ICD-10-CM

## 2023-03-11 DIAGNOSIS — E876 Hypokalemia: Secondary | ICD-10-CM | POA: Diagnosis not present

## 2023-03-11 DIAGNOSIS — Z515 Encounter for palliative care: Secondary | ICD-10-CM

## 2023-03-11 DIAGNOSIS — R4589 Other symptoms and signs involving emotional state: Secondary | ICD-10-CM

## 2023-03-11 HISTORY — PX: IR PERC CHOLECYSTOSTOMY: IMG2326

## 2023-03-11 LAB — COMPREHENSIVE METABOLIC PANEL
ALT: 23 U/L (ref 0–44)
AST: 19 U/L (ref 15–41)
Albumin: 2.6 g/dL — ABNORMAL LOW (ref 3.5–5.0)
Alkaline Phosphatase: 177 U/L — ABNORMAL HIGH (ref 38–126)
Anion gap: 7 (ref 5–15)
BUN: 7 mg/dL (ref 6–20)
CO2: 27 mmol/L (ref 22–32)
Calcium: 8.2 mg/dL — ABNORMAL LOW (ref 8.9–10.3)
Chloride: 100 mmol/L (ref 98–111)
Creatinine, Ser: 0.3 mg/dL — ABNORMAL LOW (ref 0.44–1.00)
GFR, Estimated: >60 mL/min (ref >60)
Glucose, Bld: 104 mg/dL — ABNORMAL HIGH (ref 70–99)
Potassium: 3.1 mmol/L — ABNORMAL LOW (ref 3.5–5.1)
Sodium: 134 mmol/L — ABNORMAL LOW (ref 135–145)
Total Bilirubin: 0.5 mg/dL (ref 0.0–1.2)
Total Protein: 6.3 g/dL — ABNORMAL LOW (ref 6.5–8.1)

## 2023-03-11 LAB — CBC WITH DIFFERENTIAL/PLATELET
Abs Immature Granulocytes: 0.18 10*3/uL — ABNORMAL HIGH (ref 0.00–0.07)
Basophils Absolute: 0 10*3/uL (ref 0.0–0.1)
Basophils Relative: 0 %
Eosinophils Absolute: 0.1 10*3/uL (ref 0.0–0.5)
Eosinophils Relative: 0 %
HCT: 28.2 % — ABNORMAL LOW (ref 36.0–46.0)
Hemoglobin: 8.9 g/dL — ABNORMAL LOW (ref 12.0–15.0)
Immature Granulocytes: 1 %
Lymphocytes Relative: 11 %
Lymphs Abs: 2.9 10*3/uL (ref 0.7–4.0)
MCH: 30.8 pg (ref 26.0–34.0)
MCHC: 31.6 g/dL (ref 30.0–36.0)
MCV: 97.6 fL (ref 80.0–100.0)
Monocytes Absolute: 1.5 10*3/uL — ABNORMAL HIGH (ref 0.1–1.0)
Monocytes Relative: 6 %
Neutro Abs: 21.7 10*3/uL — ABNORMAL HIGH (ref 1.7–7.7)
Neutrophils Relative %: 82 %
Platelets: 250 10*3/uL (ref 150–400)
RBC: 2.89 MIL/uL — ABNORMAL LOW (ref 3.87–5.11)
RDW: 16.6 % — ABNORMAL HIGH (ref 11.5–15.5)
WBC: 26.4 10*3/uL — ABNORMAL HIGH (ref 4.0–10.5)
nRBC: 0 % (ref 0.0–0.2)

## 2023-03-11 LAB — PHOSPHORUS: Phosphorus: 2.3 mg/dL — ABNORMAL LOW (ref 2.5–4.6)

## 2023-03-11 LAB — HEPARIN LEVEL (UNFRACTIONATED)
Heparin Unfractionated: 0.12 [IU]/mL — ABNORMAL LOW (ref 0.30–0.70)
Heparin Unfractionated: 0.22 [IU]/mL — ABNORMAL LOW (ref 0.30–0.70)

## 2023-03-11 LAB — MAGNESIUM: Magnesium: 1.6 mg/dL — ABNORMAL LOW (ref 1.7–2.4)

## 2023-03-11 MED ORDER — MEPERIDINE HCL 25 MG/ML IJ SOLN
INTRAMUSCULAR | Status: AC
  Filled 2023-03-11: qty 1

## 2023-03-11 MED ORDER — IOHEXOL 300 MG/ML  SOLN
50.0000 mL | Freq: Once | INTRAMUSCULAR | Status: AC | PRN
  Administered 2023-03-11: 20 mL

## 2023-03-11 MED ORDER — SODIUM CHLORIDE 0.9% FLUSH
5.0000 mL | Freq: Three times a day (TID) | INTRAVENOUS | Status: DC
  Administered 2023-03-11 – 2023-03-14 (×9): 5 mL

## 2023-03-11 MED ORDER — HEPARIN BOLUS VIA INFUSION
2000.0000 [IU] | Freq: Once | INTRAVENOUS | Status: AC
Start: 2023-03-11 — End: 2023-03-11
  Administered 2023-03-11: 2000 [IU] via INTRAVENOUS
  Filled 2023-03-11: qty 2000

## 2023-03-11 MED ORDER — POTASSIUM PHOSPHATES 15 MMOLE/5ML IV SOLN
15.0000 mmol | Freq: Once | INTRAVENOUS | Status: AC
  Administered 2023-03-11: 15 mmol via INTRAVENOUS
  Filled 2023-03-11: qty 5

## 2023-03-11 MED ORDER — LORAZEPAM 2 MG/ML IJ SOLN
0.5000 mg | Freq: Four times a day (QID) | INTRAMUSCULAR | Status: DC | PRN
  Administered 2023-03-11: 0.5 mg via INTRAVENOUS
  Filled 2023-03-11: qty 1

## 2023-03-11 MED ORDER — LACTULOSE 10 GM/15ML PO SOLN: 10.0000 g | Freq: Every day | ORAL | Status: DC | PRN

## 2023-03-11 MED ORDER — POLYETHYLENE GLYCOL 3350 17 G PO PACK
17.0000 g | PACK | Freq: Every day | ORAL | Status: DC | PRN
  Administered 2023-03-12: 17 g via ORAL
  Filled 2023-03-11: qty 1

## 2023-03-11 MED ORDER — MIDAZOLAM HCL 2 MG/2ML IJ SOLN
INTRAMUSCULAR | Status: AC | PRN
  Administered 2023-03-11 (×2): 1 mg via INTRAVENOUS

## 2023-03-11 MED ORDER — LIDOCAINE-EPINEPHRINE 1 %-1:100000 IJ SOLN
20.0000 mL | Freq: Once | INTRAMUSCULAR | Status: AC
  Administered 2023-03-11: 20 mL via INTRADERMAL
  Filled 2023-03-11: qty 20

## 2023-03-11 MED ORDER — FENTANYL CITRATE (PF) 100 MCG/2ML IJ SOLN
INTRAMUSCULAR | Status: AC | PRN
  Administered 2023-03-11 (×2): 50 ug via INTRAVENOUS

## 2023-03-11 MED ORDER — LIDOCAINE-EPINEPHRINE 1 %-1:100000 IJ SOLN
INTRAMUSCULAR | Status: AC
  Filled 2023-03-11: qty 1

## 2023-03-11 MED ORDER — MAGNESIUM SULFATE 2 GM/50ML IV SOLN
2.0000 g | Freq: Once | INTRAVENOUS | Status: AC
  Administered 2023-03-11: 2 g via INTRAVENOUS
  Filled 2023-03-11: qty 50

## 2023-03-11 MED ORDER — LORAZEPAM 2 MG/ML IJ SOLN: 1.0000 mg | Freq: Four times a day (QID) | INTRAMUSCULAR | Status: DC | PRN

## 2023-03-11 MED ORDER — DIPHENHYDRAMINE HCL 50 MG/ML IJ SOLN
INTRAMUSCULAR | Status: AC
  Filled 2023-03-11: qty 1

## 2023-03-11 MED ORDER — FENTANYL CITRATE (PF) 100 MCG/2ML IJ SOLN
INTRAMUSCULAR | Status: AC
Start: 2023-03-11 — End: ?
  Filled 2023-03-11: qty 4

## 2023-03-11 MED ORDER — HYDROMORPHONE HCL 1 MG/ML IJ SOLN
INTRAMUSCULAR | Status: AC
  Filled 2023-03-11: qty 1

## 2023-03-11 MED ORDER — LINACLOTIDE 72 MCG PO CAPS: 72.0000 ug | ORAL_CAPSULE | Freq: Every day | ORAL | Status: DC | PRN

## 2023-03-11 MED ORDER — MEPERIDINE HCL 25 MG/ML IJ SOLN
INTRAMUSCULAR | Status: AC | PRN
  Administered 2023-03-11: 12.5 mg via INTRAVENOUS

## 2023-03-11 MED ORDER — MORPHINE SULFATE ER 30 MG PO TBCR
30.0000 mg | EXTENDED_RELEASE_TABLET | Freq: Two times a day (BID) | ORAL | Status: DC
  Administered 2023-03-11 – 2023-03-14 (×7): 30 mg via ORAL
  Filled 2023-03-11 (×7): qty 1

## 2023-03-11 MED ORDER — HYDROMORPHONE HCL 1 MG/ML IJ SOLN
INTRAMUSCULAR | Status: AC | PRN
  Administered 2023-03-11: 1 mg via INTRAVENOUS

## 2023-03-11 MED ORDER — HEPARIN (PORCINE) 25000 UT/250ML-% IV SOLN
1700.0000 [IU]/h | INTRAVENOUS | Status: AC
  Administered 2023-03-11: 1550 [IU]/h via INTRAVENOUS
  Administered 2023-03-12: 1700 [IU]/h via INTRAVENOUS
  Filled 2023-03-11: qty 250

## 2023-03-11 MED ORDER — POTASSIUM CHLORIDE CRYS ER 20 MEQ PO TBCR
40.0000 meq | EXTENDED_RELEASE_TABLET | ORAL | Status: AC
  Administered 2023-03-11 (×2): 40 meq via ORAL
  Filled 2023-03-11 (×2): qty 2

## 2023-03-11 MED ORDER — MIDAZOLAM HCL 2 MG/2ML IJ SOLN
INTRAMUSCULAR | Status: AC
  Filled 2023-03-11: qty 4

## 2023-03-11 NOTE — Progress Notes (Signed)
 PROGRESS NOTE    April [name]  BJY:[id number] DOB: 06-28-1975 DOA: 03/09/2023 PCP: Tollie Eth, NP    Brief Narrative:   April [name] is a 48 y.o. 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 Meridee Score 02/26/2023), portal vein thrombus,, anemia of chronic medical disease, essential hypertension who presented to [facility] ED on 03/09/2023 from home via EMS with right right-sided abdominal pain associated with nausea and vomiting.  In the ED, temperature 101.3 F, HR 125, RR 22, BP 134/102.  SpO2 97% on room air.  WBC 16.5, hemoglobin 12.1, platelet count 387.  Sodium 137, potassium 2.7, chloride 96, CO2 28, glucose 110, BUN 6, creatinine 0.43.  INR 1.1.  Influenza A/B/RSV/COVID PCR negative.  Urinalysis with moderate hemoglobin otherwise unremarkable.  Chest x-ray with no acute cardiopulmonary disease process.  CT abdomen/pelvis with contrast with noted rectal carcinoma with peritoneal carcinomatosis, nodular thickening in the liver hilum slightly increased from previous, indwelling biliary stents with some improving biliary ductal dilation, heterogeneous appearance of the liver with more periportal edema and areas of potential developing portal vein peripheral branch thrombus, atrophy of the right kidney, bilateral nonobstructing renal stones.  Blood cultures x 2 obtained.  Patient received hydromorphone 1 mg IVP x 2, LR 1000 mL bolus, ondansetron 4 mg IVP, KCl 10 mEq IVPB and Zosyn 3.375 g IVPB.  Received additional normal saline 1000 mL liter bolus, prochlorperazine 10 mg IVP, ketorolac 30 mg IVP and hydromorphone 2 mg IVP x 1.  Hydromorphone 2 mg IVP x 1.  The case was discussed with primary oncologist by Dr. Myrle Sheng;  no anticoagulation recommended.  TRH consulted for admission.  Assessment & Plan:    Cholecystitis Patient presenting to ED with progressive right-sided abdominal pain.  Patient noted to be febrile with WBC count elevated at 16.5.  Right upper quadrant ultrasound with no biliary dilation but common duct partially obscured, dilated gallbladder with sludge and questionable stone, significant wall thickening.  General surgery was consulted and recommended IR percutaneous drain placement given she is a poor candidate for surgical management given her metastatic cancer. -- General Surgery/IR following, appreciate assistance -- WBC 16.5>27.2>26.4 -- Underwent cholecystostomy tube placement on 03/11/2023 -- Blood cultures x 2: No growth x 2 days -- Operative culture from cholecystostomy: Pending -- Cefepime 2 g IV every 8 hours -- Metronidazole 500 mg IV every 12 hours -- MS Contin 30 mg PO q12h -- Dilaudid 1-2 mg IV every 2 hours as needed severe pain -- Lorazepam 0.5 mg IV every 6 hours as needed anxiety/nausea -- Flush tube with 5 mL NS every 8 hours, monitor drain output -- Outpatient follow-up with IR 6 weeks for routine tube evaluation -- CBC daily  Hypokalemia Potassium 2.7 on admission, repleted.  Repeat potassium 3.1 this morning.  Will continue repletion.  Hypomagnesemia Magnesium 1.6, will replete. -- Repeat electrolytes in a.m.  Hypophosphatemia Phosphate 2.3, will replete. -- Repeat electrolytes in a.m.  Stage IV sigmoid colon cancer with peritoneal carcinomatosis, liver metastasis Follow with medical oncology outpatient, Dr. Truett Perna.  Currently on salvage therapy with irinotecan/panitumumab  -- Medical oncology, palliative care following; appreciate assistance  Anemia of chronic medical disease -- Hemoglobin 8.9, stable. --Transfuse hemoglobin less than 7.0  Essential Hypertension On metoprolol tartrate 50 mg p.o. twice daily at baseline.  BP 116/89. -- Hold oral antihypertensives for now   DVT prophylaxis:  Heparin drip    Code Status: Full  Code Family Communication: No family present at bedside  Disposition Plan:  Level of care: Progressive Status is: Inpatient Remains inpatient appropriate because: IR for cholecystostomy tube placement today, IV antibiotics    Consultants:  Medical oncology General Surgery Interventional radiology Palliative care  Procedures:  Cholecystostomy, IR 2/26  Antimicrobials:  Zosyn 2/24 - 2/24 Cefepime 2/24>> Metronidazole 2/24>>   Subjective: Patient seen examined bedside, lying in bed.  Continues with exquisite right-sided pain.  Holding heating pack to abdomen.  Does not want me to touch her abdomen due to the pain.  Reviewed right upper quadrant ultrasound concerning for acute cholecystitis.  Consulted general surgery.  Given her malignancy, not an operative candidate but recommended IR consultation for cholecystostomy placement.  Patient with no other specific questions, concerns or complaints at this time.  Denies headache, no dizziness, no chest pain, no palpitations, no shortness of breath, no chills/night sweats, no current nausea/vomiting, no diarrhea, no focal weakness, no fatigue, no paresthesia.  No acute events overnight per nurse staff.  Objective: Vitals:   03/11/23 1455 03/11/23 1500 03/11/23 1505 03/11/23 1550  BP: (!) 132/100 (!) 149/100 (!) 148/100 (!) 139/100  Pulse: 84 83 95 93  Resp: 17 20 (!) 22   Temp:    98 F (36.7 C)  TempSrc:      SpO2: 99% 99% 99% 95%  Weight:      Height:        Intake/Output Summary (Last 24 hours) at 03/11/2023 1811 Last data filed at 03/11/2023 1759 Gross per 24 hour  Intake 1668.56 ml  Output 540 ml  Net 1128.56 ml   Filed Weights   03/09/23 2042  Weight: 62.4 kg    Examination:  Physical Exam: GEN: NAD, alert and oriented x 3, chronically ill in appearance HEENT: NCAT, PERRL, EOMI, sclera clear, dry mucous membranes PULM: CTAB w/o wheezes/crackles, normal respiratory effort, on room air CV: RRR w/o M/G/R GI: abd  soft, nondistended, + TTP RUQ, right midline, + BS MSK: no peripheral edema, moves all extremities independently NEURO: No focal neurological deficits PSYCH: normal mood/affect Integumentary: No concerning rashes/lesions/wounds noted on exposed skin surfaces    Data Reviewed: I have personally reviewed following labs and imaging studies  CBC: Recent Labs  Lab 03/09/23 0943 03/09/23 0944 03/10/23 0348 03/11/23 0356  WBC  --  16.5* 27.2* 26.4*  NEUTROABS  --  12.9* 23.1* 21.7*  HGB 13.3 12.1 9.8* 8.9*  HCT 39.0 37.7 30.3* 28.2*  MCV  --  94.3 95.3 97.6  PLT  --  387 292 250   Basic Metabolic Panel: Recent Labs  Lab 03/09/23 0943 03/09/23 0944 03/10/23 0348 03/11/23 0356  NA 136 137 136 134*  K 2.8* 2.7* 3.6 3.1*  CL 94* 96* 102 100  CO2  --  28 28 27   GLUCOSE 111* 110* 94 104*  BUN 4* 6 9 7   CREATININE 0.50 0.43* 0.48 0.30*  CALCIUM  --  9.4 8.5* 8.2*  MG  --  1.7  --  1.6*  PHOS  --  4.1  --  2.3*   GFR: Estimated Creatinine Clearance: 73.5 mL/min (A) (by C-G formula based on SCr of 0.3 mg/dL (L)). Liver Function Tests: Recent Labs  Lab 03/09/23 0944 03/10/23 0348 03/11/23 0356  AST 25 29 19   ALT 32 29 23  ALKPHOS 216* 191* 177*  BILITOT 0.6 1.1 0.5  PROT 8.0 6.7 6.3*  ALBUMIN 3.6 2.8* 2.6*  No results for input(s): "LIPASE", "AMYLASE" in the last 168 hours. No results for input(s): "AMMONIA" in the last 168 hours. Coagulation Profile: Recent Labs  Lab 03/09/23 0944  INR 1.1   Cardiac Enzymes: No results for input(s): "CKTOTAL", "CKMB", "CKMBINDEX", "TROPONINI" in the last 168 hours. BNP (last 3 results) No results for input(s): "PROBNP" in the last 8760 hours. HbA1C: No results for input(s): "HGBA1C" in the last 72 hours. CBG: No results for input(s): "GLUCAP" in the last 168 hours. Lipid Profile: No results for input(s): "CHOL", "HDL", "LDLCALC", "TRIG", "CHOLHDL", "LDLDIRECT" in the last 72 hours. Thyroid Function Tests: No results for  input(s): "TSH", "T4TOTAL", "FREET4", "T3FREE", "THYROIDAB" in the last 72 hours. Anemia Panel: No results for input(s): "VITAMINB12", "FOLATE", "FERRITIN", "TIBC", "IRON", "RETICCTPCT" in the last 72 hours. Sepsis Labs: Recent Labs  Lab 03/09/23 0943  LATICACIDVEN 1.0    Recent Results (from the past 240 hours)  Blood Culture (routine x 2)     Status: None (Preliminary result)   Collection Time: 03/09/23  9:43 AM   Specimen: BLOOD  Result Value Ref Range Status   Specimen Description   Final    BLOOD RIGHT ANTECUBITAL Performed at [facility], [address], [place], [place] [address]    Special Requests   Final    BOTTLES DRAWN AEROBIC AND ANAEROBIC Blood Culture adequate volume Performed at [facility], [address] [place], [place] [address]    Culture   Final    NO GROWTH 2 DAYS Performed at [facility], [address] [place], [place] [address]    Report Status PENDING  Incomplete  Resp panel by RT-PCR (RSV, Flu A&B, Covid) Anterior Nasal Swab     Status: None   Collection Time: 03/09/23  9:44 AM   Specimen: Anterior Nasal Swab  Result Value Ref Range Status   SARS Coronavirus 2 by RT PCR NEGATIVE NEGATIVE Final    Comment: (NOTE) SARS-CoV-2 target nucleic acids are NOT DETECTED.  The SARS-CoV-2 RNA is generally detectable in upper respiratory specimens during the acute phase of infection. The lowest concentration of SARS-CoV-2 viral copies this assay can detect is 138 copies/mL. A negative result does not preclude SARS-Cov-2 infection and should not be used as the sole basis for treatment or other patient management decisions. A negative result may occur with  improper specimen collection/handling, submission of specimen other than nasopharyngeal swab, presence of viral mutation(s) within the areas targeted by this assay, and inadequate number of viral copies(<138 copies/mL). A negative result must be combined  with clinical observations, patient history, and epidemiological information. The expected result is Negative.  Fact Sheet for Patients:  BloggerCourse.com  Fact Sheet for Healthcare Providers:  SeriousBroker.it  This test is no t yet approved or cleared by the [place] FDA and  has been authorized for detection and/or diagnosis of SARS-CoV-2 by FDA under an Emergency Use Authorization (EUA). This EUA will remain  in effect (meaning this test can be used) for the duration of the COVID-19 declaration under Section 564(b)(1) of the Act, 21 U.S.C.section 360bbb-3(b)(1), unless the authorization is terminated  or revoked sooner.       Influenza A by PCR NEGATIVE NEGATIVE Final   Influenza B by PCR NEGATIVE NEGATIVE Final    Comment: (NOTE) The Xpert Xpress SARS-CoV-2/FLU/RSV plus assay is intended as an aid in the diagnosis of influenza from Nasopharyngeal swab specimens and should not be used as a sole basis for treatment.  Nasal washings and aspirates are unacceptable for Xpert Xpress SARS-CoV-2/FLU/RSV testing.  Fact Sheet for Patients: BloggerCourse.com  Fact Sheet for Healthcare Providers: SeriousBroker.it  This test is not yet approved or cleared by the [place] FDA and has been authorized for detection and/or diagnosis of SARS-CoV-2 by FDA under an Emergency Use Authorization (EUA). This EUA will remain in effect (meaning this test can be used) for the duration of the COVID-19 declaration under Section 564(b)(1) of the Act, 21 U.S.C. section 360bbb-3(b)(1), unless the authorization is terminated or revoked.     Resp Syncytial Virus by PCR NEGATIVE NEGATIVE Final    Comment: (NOTE) Fact Sheet for Patients: BloggerCourse.com  Fact Sheet for Healthcare Providers: SeriousBroker.it  This test is not yet approved  or cleared by the [place] FDA and has been authorized for detection and/or diagnosis of SARS-CoV-2 by FDA under an Emergency Use Authorization (EUA). This EUA will remain in effect (meaning this test can be used) for the duration of the COVID-19 declaration under Section 564(b)(1) of the Act, 21 U.S.C. section 360bbb-3(b)(1), unless the authorization is terminated or revoked.  Performed at [facility], [address], [place], [place] [address]   Blood Culture (routine x 2)     Status: None (Preliminary result)   Collection Time: 03/09/23 11:48 AM   Specimen: BLOOD RIGHT HAND  Result Value Ref Range Status   Specimen Description   Final    BLOOD RIGHT HAND Performed at [facility], [address], [place], [place] [address]    Special Requests   Final    BOTTLES DRAWN AEROBIC AND ANAEROBIC Blood Culture adequate volume Performed at [facility], [address] [place], [place] [address]    Culture   Final    NO GROWTH 2 DAYS Performed at [facility], [address], [place], [place] [address]    Report Status PENDING  Incomplete         Radiology Studies: IR Perc Cholecystostomy Result Date: 03/11/2023 INDICATION: Acute cholecystitis EXAM: ULTRASOUND AND FLUOROSCOPIC-GUIDED CHOLECYSTOSTOMY TUBE PLACEMENT COMPARISON:  US Abdomen, 03/09/2023.  CT AP, 03/10/2023. MEDICATIONS: The patient is currently admitted to the hospital and on intravenous antibiotics. Antibiotics were administered within an appropriate time frame prior to skin puncture. 1 mg Dilaudid IV.  50 mg Benadryl IV.  25 mg Demerol IV ANESTHESIA/SEDATION: Moderate (conscious) sedation was employed during this procedure. A total of Versed 2 mg and Fentanyl 100 mcg was administered intravenously. Moderate Sedation Time: 16 minutes. The patient's level of consciousness and vital signs were monitored continuously by radiology nursing throughout the procedure under my  direct supervision. CONTRAST:  20mL OMNIPAQUE IOHEXOL 300 MG/ML SOLN - administered into the gallbladder fossa. FLUOROSCOPY TIME:  Fluoroscopic dose; 3 mGy COMPLICATIONS: None immediate. PROCEDURE: Informed written consent was obtained from the patient after a discussion of the risks, benefits and alternatives to treatment. Questions regarding the procedure were encouraged and answered. A timeout was performed prior to the initiation of the procedure. The right upper abdominal quadrant was prepped and draped in the usual sterile fashion, and a sterile drape was applied covering the operative field. Maximum barrier sterile technique with sterile gowns and gloves were used for the procedure. A timeout was performed prior to the initiation of the procedure. Local anesthesia was provided with 1% lidocaine with epinephrine. Ultrasound scanning of the right upper quadrant demonstrates a markedly dilated gallbladder. Of note, the patient reported pain with ultrasound imaging over the gallbladder.  Utilizing a transhepatic approach, a 22 gauge needle was advanced into the gallbladder under direct ultrasound guidance. An ultrasound image was saved for documentation purposes. Appropriate intraluminal puncture was confirmed with the efflux of bile and advancement of an 0.018 wire into the gallbladder lumen. The needle was exchanged for an Accustick set. A small amount of contrast was injected to confirm appropriate intraluminal positioning. Over a Benson wire, a 10 Fr cholecystomy tube was advanced into the gallbladder fossa, coiled and locked. Bile was aspirated and a small amount of contrast was injected as several post procedural spot radiographic images were obtained in various obliquities. The catheter was secured to the skin with suture, connected to a drainage bag and a dressing was placed. The patient tolerated the procedure well without immediate post procedural complication. IMPRESSION: Successful ultrasound and  fluoroscopic guided placement of a 10 Fr cholecystostomy tube. 70 mL of serous bilious fluid was aspirated. A sample submitted for microbiological analysis. RECOMMENDATIONS: The patient will return to Vascular Interventional Radiology (VIR) for routine drainage catheter evaluation and exchange in 6-8 weeks. Roanna Banning, MD [facility] Electronically Signed   By: Roanna Banning M.D.   On: 03/11/2023 17:54   US Abdomen Limited RUQ (LIVER/GB) Result Date: 03/10/2023 CLINICAL DATA:  Right upper quadrant pain for 2 days. EXAM: ULTRASOUND ABDOMEN LIMITED RIGHT UPPER QUADRANT COMPARISON:  CT abdomen pelvis 03/09/2023 FINDINGS: Gallbladder: Dilated gallbladder with wall thickening measuring up to 7 mm. There is also sludge. Questionable stone. Common bile duct: Diameter: 2 mm.  Obscured by the known indwelling biliary stents. Liver: Homogeneous hepatic parenchyma. Tiny benign-appearing cyst in left hepatic lobe measuring 9 mm. This was seen previously. Portal vein is patent on color Doppler imaging with normal direction of blood flow towards the liver. Other: Trace ascites. IMPRESSION: No biliary dilatation but the common duct is partially obscured by the known biliary stents. Dilated gallbladder with sludge and questionable stone. There is also significant wall thickening. Please correlate for any clinical evidence of acute cholecystitis although with the patient's history there is a differential. Further workup such as HIDA scan as clinically appropriate. Mild ascites. Please correlate with prior CT Electronically Signed   By: Karen Kays M.D.   On: 03/10/2023 19:41        Scheduled Meds:  acetaminophen  1,000 mg Oral Q8H   Chlorhexidine Gluconate Cloth  6 each Topical Daily   morphine  30 mg Oral Q12H   sodium chloride flush  10-40 mL Intracatheter Q12H   sodium chloride flush  5 mL Intracatheter Q8H   Continuous Infusions:  ceFEPime (MAXIPIME)  IV 2 g (03/11/23 1225)   heparin     metronidazole 500 mg (03/11/23 1534)     LOS: 2 days    Time spent: 53 minutes spent on chart review, discussion with nursing staff, consultants, updating family and interview/physical exam; more than 50% of that time was spent in counseling and/or coordination of care.    Alvira Philips [place], DO [facility] Available via Epic secure chat 7am-7pm After these hours, please refer to coverage provider listed on [email or web address] 03/11/2023, 6:11 PM

## 2023-03-11 NOTE — Consult Note (Signed)
 April [name] 09-22-1975  [id number].    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 Meridee Score 02/26/23. Portal vein thrombus, and HTN who presents from home with RUQ abdominal pain, nausea, and vomiting. Workup significant for fever, leukocytosis (27), and CT scan of the abdomen and pelvis showing known rectal carcinoma with carcinomatosis, progression of nodular thickening involving the liver hilum and the porta hepatis, as well as increasing gallbladder wall edema, biliary stents appear patent and interval decrease in biliary dilation.   CCS is asked to see for management of cholecystitis.  Per chart review, the patient had ERCP 2/13 and on the cholangiogram the cystic duct and gallblader did not fill, consistent with cystic duct obstruction.   Oncologist: Dr. Alcide Evener. Last dose chemo 2/4  ROS: Review of Systems  All other systems reviewed and are negative.   Family History  Problem Relation Age of Onset   Hypertension Mother    Diabetes Mellitus II Father    Stroke Father    Ulcers Sister    Colon cancer Neg Hx    Esophageal cancer Neg Hx    Inflammatory bowel disease Neg Hx    Liver disease Neg Hx    Pancreatic cancer Neg Hx    Rectal cancer Neg Hx    Stomach cancer Neg Hx     Past Medical History:  Diagnosis Date   Ascending aortic aneurysm (HCC) 12/16/2022   Benign essential hypertension 10/06/2017   Last Assessment & Plan:  Formatting of this note might be different from the original. Patient's blood pressure noted to be elevated at today's visit 188/98.  Patient will be provided with antihypertensives in the treatment center if needed in order to meet her Avastin parameters.  Patient recommended to follow up with  her primary care physician regarding medication management.  Patient is not cur   Cancer [facility])    Change in bowel habits 06/28/2022   Colon cancer (HCC)    History of colon cancer 06/28/2022   Hypertension    Prolonged QT interval 01/07/2018   Renal calculi    bilateral   Sepsis due to undetermined organism (HCC) 12/24/2022   Sialoadenitis of submandibular gland 12/16/2022    Past Surgical History:  Procedure Laterality Date   BALLOON DILATION N/A 02/26/2023   Procedure: BALLOON DILATION;  Surgeon: Lemar Lofty., MD;  Location: [facility] ENDOSCOPY;  Service: Gastroenterology;  Laterality: N/A;   BILIARY BRUSHING  03/24/2022   Procedure: BILIARY BRUSHING;  Surgeon: Meridee Score Netty Starring., MD;  Location: [facility] ENDOSCOPY;  Service: Gastroenterology;;   BILIARY DILATION  03/24/2022   Procedure: BILIARY DILATION;  Surgeon: Lemar Lofty., MD;  Location: [facility] ENDOSCOPY;  Service: Gastroenterology;;   BILIARY STENT PLACEMENT  03/24/2022   Procedure: BILIARY STENT PLACEMENT;  Surgeon: Lemar Lofty., MD;  Location: [facility] ENDOSCOPY;  Service: Gastroenterology;;   BILIARY STENT PLACEMENT N/A 02/26/2023   Procedure: BILIARY STENT PLACEMENT;  Surgeon: Lemar Lofty., MD;  Location: [facility] ENDOSCOPY;  Service: Gastroenterology;  Laterality: N/A;   BIOPSY  03/24/2022   Procedure: BIOPSY;  Surgeon: Meridee Score Netty Starring., MD;  Location: [facility] ENDOSCOPY;  Service: Gastroenterology;;   BIOPSY  11/23/2022   Procedure: BIOPSY;  Surgeon: Hilarie Fredrickson, MD;  Location: [facility] ENDOSCOPY;  Service: Gastroenterology;;  BIOPSY  02/26/2023   Procedure: BIOPSY;  Surgeon: Meridee Score Netty Starring., MD;  Location: [facility] ENDOSCOPY;  Service: Gastroenterology;;   CESAREAN SECTION     x2   COLONOSCOPY N/A 11/23/2022   Procedure: COLONOSCOPY;  Surgeon: Hilarie Fredrickson, MD;  Location: [facility] ENDOSCOPY;  Service: Gastroenterology;  Laterality: N/A;   CYSTOSCOPY WITH RETROGRADE PYELOGRAM, URETEROSCOPY AND STENT PLACEMENT  Right 10/22/2020   Procedure: CYSTOSCOPY WITH RETROGRADE PYELOGRAM, URETEROSCOPY AND STENT PLACEMENT;  Surgeon: Noel Christmas, MD;  Location: [facility] ORS;  Service: Urology;  Laterality: Right;   ENDOSCOPIC RETROGRADE CHOLANGIOPANCREATOGRAPHY (ERCP) WITH PROPOFOL N/A 02/26/2023   Procedure: ENDOSCOPIC RETROGRADE CHOLANGIOPANCREATOGRAPHY (ERCP) WITH PROPOFOL;  Surgeon: Meridee Score Netty Starring., MD;  Location: [facility] ENDOSCOPY;  Service: Gastroenterology;  Laterality: N/A;   ERCP N/A 03/24/2022   Procedure: ENDOSCOPIC RETROGRADE CHOLANGIOPANCREATOGRAPHY (ERCP);  Surgeon: Lemar Lofty., MD;  Location: [facility] ENDOSCOPY;  Service: Gastroenterology;  Laterality: N/A;   ESOPHAGOGASTRODUODENOSCOPY (EGD) WITH PROPOFOL N/A 02/26/2023   Procedure: ESOPHAGOGASTRODUODENOSCOPY (EGD) WITH PROPOFOL;  Surgeon: Meridee Score Netty Starring., MD;  Location: [facility] ENDOSCOPY;  Service: Gastroenterology;  Laterality: N/A;  patient needs EGD in addition to ERCP   IR PATIENT EVAL TECH 0-60 MINS  12/15/2022   IR RADIOLOGIST EVAL & MGMT  09/16/2019   IR RADIOLOGIST EVAL & MGMT  09/23/2019   IR RADIOLOGIST EVAL & MGMT  09/20/2019   IR RADIOLOGIST EVAL & MGMT  09/30/2019   IR REMOVAL TUN ACCESS W/ PORT W/O FL MOD SED  09/14/2019   IR VENO/EXT/UNI RIGHT  04/11/2022   PANCREATIC STENT PLACEMENT  03/24/2022   Procedure: PANCREATIC STENT PLACEMENT;  Surgeon: Lemar Lofty., MD;  Location: [facility] ENDOSCOPY;  Service: Gastroenterology;;   REMOVAL OF STONES  03/24/2022   Procedure: REMOVAL OF STONES;  Surgeon: Lemar Lofty., MD;  Location: [facility] ENDOSCOPY;  Service: Gastroenterology;;   REMOVAL OF STONES  02/26/2023   Procedure: REMOVAL OF Sludge;  Surgeon: Lemar Lofty., MD;  Location: [facility] ENDOSCOPY;  Service: Gastroenterology;;   Dennison Mascot  03/24/2022   Procedure: Dennison Mascot;  Surgeon: Lemar Lofty., MD;  Location: [facility] ENDOSCOPY;  Service: Gastroenterology;;   Francine Graven REMOVAL  02/26/2023   Procedure: STENT REMOVAL;   Surgeon: Lemar Lofty., MD;  Location: [facility] ENDOSCOPY;  Service: Gastroenterology;;   URETERAL STENT PLACEMENT      Social History:  reports that she has quit smoking. Her smoking use included cigarettes. She has never used smokeless tobacco. She reports that she does not currently use alcohol. She reports that she does not currently use drugs after having used the following drugs: Marijuana.  Allergies:  Allergies  Allergen Reactions   Lisinopril Swelling and Other (See Comments)    Lip swelling and Angioedema   Irinotecan Other (See Comments)    Stomach cramping Patient given 0.5 mL atropine IV and was able to continue infusion.  See Progress note on 10/03/20.    Leucovorin Calcium Itching    Patient reported itchy palms towards end of transfusion, see progress note 08/04/22.    Oxaliplatin Itching    Patient reported itchy palms towards end of transfusion, see progress note on 08/04/22.    Medications Prior to Admission  Medication Sig Dispense Refill   amLODipine (NORVASC) 10 MG tablet Take 1 tablet (10 mg total) by mouth daily. 90 tablet 0   dexamethasone (DECADRON) 4 MG tablet Take 1 tablet by mouth twice a day for 3 days beginning day 2 of chemo. 12 tablet 2   dicyclomine (BENTYL) 20 MG tablet  Take 1 tablet (20 mg total) by mouth 3 (three) times daily as needed for spasms (rectal/AB pain). (Patient taking differently: Take 20 mg by mouth 2 (two) times daily as needed for spasms (rectal/AB pain).) 60 tablet 1   HYDROmorphone (DILAUDID) 2 MG tablet Take 1 tablet (2 mg total) by mouth every 6 (six) hours as needed for severe pain (pain score 7-10). (Patient taking differently: Take 4 mg by mouth 3 (three) times daily as needed for severe pain (pain score 7-10).) 30 tablet 0   lactulose (CHRONULAC) 10 GM/15ML solution Take 15-30 mLs by mouth daily as needed for mild constipation.     linaclotide (LINZESS) 72 MCG capsule Take 72 mcg by mouth daily as needed (constipation).      metoprolol tartrate (LOPRESSOR) 50 MG tablet Take 1 tablet (50 mg total) by mouth 2 (two) times daily. 180 tablet 0   pantoprazole (PROTONIX) 40 MG tablet Take 1 tablet (40 mg total) by mouth daily before breakfast. 30 tablet 6   polyethylene glycol (MIRALAX / GLYCOLAX) 17 g packet Take 17 g by mouth daily as needed. (Patient taking differently: Take 17 g by mouth daily as needed for moderate constipation or mild constipation.) 14 each 0   potassium chloride (MICRO-K) 10 MEQ CR capsule Take 2 capsules (20 mEq total) by mouth 2 (two) times daily. (Patient taking differently: Take 30 mEq by mouth 2 (two) times daily.) 120 capsule 2   prochlorperazine (COMPAZINE) 10 MG tablet Take 1 tablet (10 mg total) by mouth every 6 (six) hours as needed for nausea or vomiting. 30 tablet 2   doxycycline (ADOXA) 100 MG tablet Take 100 mg by mouth 2 (two) times daily. (Patient not taking: Reported on 03/09/2023)     magic mouthwash (nystatin, diphenhydrAMINE, alum & mag hydroxide) suspension mixture Swish and Spit 5 mLs by mouth 4 (four) times daily as needed. (Patient not taking: Reported on 02/17/2023) 240 mL 1   morphine (MS CONTIN) 30 MG 12 hr tablet Take 30 mg by mouth every 12 (twelve) hours. (Patient not taking: Reported on 03/09/2023)     ondansetron (ZOFRAN) 8 MG tablet Take 1 tablet (8 mg total) by mouth every 8 (eight) hours as needed for nausea or vomiting. Can start taking 72 hours after chemotherapy (Patient not taking: Reported on 03/09/2023) 30 tablet 1     Physical Exam: Blood pressure (!) 144/102, pulse 82, temperature 97.8 F (36.6 C), temperature source Oral, resp. rate 18, height 5\' 4"  (1.626 m), weight 62.4 kg, last menstrual period 01/07/2006, SpO2 97%. General: cooperative female laying on hospital bed, appears stated age, NAD. HEENT: head -normocephalic, atraumatic; Eyes: PERRLA, no conjunctival injection;  Neck- Trachea is midline, no thyromegaly or JVD appreciated.  CV- RRR, normal S1/S2, no  M/R/G Pulm- breathing is non-labored ORA Abd- soft, mild distention, very TTP - exam limited due to patient voluntary guarding  GU- deferred  MSK- UE/LE symmetrical, no cyanosis, clubbing, or edema. Neuro- CN II-XII grossly in tact, no paresthesias. Psych- Alert and Oriented x3 with appropriate affect Skin: warm and dry, no rashes or lesions   Results for orders placed or performed during the hospital encounter of 03/09/23 (from the past 48 hours)  Urinalysis, w/ Reflex to Culture (Infection Suspected) -Urine, Clean Catch     Status: Abnormal   Collection Time: 03/09/23  4:26 PM  Result Value Ref Range   Specimen Source URINE, CLEAN CATCH    Color, Urine YELLOW YELLOW   APPearance CLEAR CLEAR  Specific Gravity, Urine RESULTS UNAVAILABLE DUE TO INTERFERING SUBSTANCE 1.005 - 1.030   pH 5.0 5.0 - 8.0   Glucose, UA NEGATIVE NEGATIVE mg/dL   Hgb urine dipstick MODERATE (A) NEGATIVE   Bilirubin Urine NEGATIVE NEGATIVE   Ketones, ur NEGATIVE NEGATIVE mg/dL   Protein, ur NEGATIVE NEGATIVE mg/dL   Nitrite NEGATIVE NEGATIVE   Leukocytes,Ua NEGATIVE NEGATIVE   RBC / HPF 6-10 0 - 5 RBC/hpf   WBC, UA 0-5 0 - 5 WBC/hpf    Comment:        Reflex urine culture not performed if WBC <=10, OR if Squamous epithelial cells >5. If Squamous epithelial cells >5 suggest recollection.    Bacteria, UA RARE (A) NONE SEEN   Squamous Epithelial / HPF 0-5 0 - 5 /HPF   Mucus PRESENT     Comment: Performed at [facility], [address], [place], [place] [address]  CBC with Differential     Status: Abnormal   Collection Time: 03/10/23  3:48 AM  Result Value Ref Range   WBC 27.2 (H) 4.0 - 10.5 K/uL   RBC 3.18 (L) 3.87 - 5.11 MIL/uL   Hemoglobin 9.8 (L) 12.0 - 15.0 g/dL   HCT 95.6 (L) 21.3 - 08.6 %   MCV 95.3 80.0 - 100.0 fL   MCH 30.8 26.0 - 34.0 pg   MCHC 32.3 30.0 - 36.0 g/dL   RDW 57.8 (H) 46.9 - 62.9 %   Platelets 292 150 - 400 K/uL   nRBC 0.0 0.0 - 0.2 %   Neutrophils  Relative % 85 %   Neutro Abs 23.1 (H) 1.7 - 7.7 K/uL   Lymphocytes Relative 9 %   Lymphs Abs 2.3 0.7 - 4.0 K/uL   Monocytes Relative 5 %   Monocytes Absolute 1.5 (H) 0.1 - 1.0 K/uL   Eosinophils Relative 0 %   Eosinophils Absolute 0.1 0.0 - 0.5 K/uL   Basophils Relative 0 %   Basophils Absolute 0.1 0.0 - 0.1 K/uL   Immature Granulocytes 1 %   Abs Immature Granulocytes 0.17 (H) 0.00 - 0.07 K/uL    Comment: Performed at [facility], [address], [place], [place] [address]  Comprehensive metabolic panel     Status: Abnormal   Collection Time: 03/10/23  3:48 AM  Result Value Ref Range   Sodium 136 135 - 145 mmol/L   Potassium 3.6 3.5 - 5.1 mmol/L   Chloride 102 98 - 111 mmol/L   CO2 28 22 - 32 mmol/L   Glucose, Bld 94 70 - 99 mg/dL    Comment: Glucose reference range applies only to samples taken after fasting for at least 8 hours.   BUN 9 6 - 20 mg/dL   Creatinine, Ser 3.24 0.44 - 1.00 mg/dL   Calcium 8.5 (L) 8.9 - 10.3 mg/dL   Total Protein 6.7 6.5 - 8.1 g/dL   Albumin 2.8 (L) 3.5 - 5.0 g/dL   AST 29 15 - 41 U/L   ALT 29 0 - 44 U/L   Alkaline Phosphatase 191 (H) 38 - 126 U/L   Total Bilirubin 1.1 0.0 - 1.2 mg/dL   GFR, Estimated >40 >10 mL/min    Comment: (NOTE) Calculated using the CKD-EPI Creatinine Equation (2021)    Anion gap 6 5 - 15    Comment: Performed at [facility], [address], [place], [place] [address]  Heparin level (unfractionated)     Status: Abnormal   Collection Time: 03/10/23  8:21 PM  Result Value Ref Range   Heparin Unfractionated 0.10 (L) 0.30 - 0.70 IU/mL    Comment: (NOTE) The clinical reportable range upper limit is being lowered to >1.10 to align with the FDA approved guidance for the current laboratory assay.  If heparin results are below expected values, and patient dosage has  been confirmed, suggest follow up testing of antithrombin III levels. Performed at [facility], [address], [place], [place] [address]   CBC with Differential     Status: Abnormal   Collection Time: 03/11/23  3:56 AM  Result Value Ref Range   WBC 26.4 (H) 4.0 - 10.5 K/uL   RBC 2.89 (L) 3.87 - 5.11 MIL/uL   Hemoglobin 8.9 (L) 12.0 - 15.0 g/dL   HCT 62.1 (L) 30.8 - 65.7 %   MCV 97.6 80.0 - 100.0 fL   MCH 30.8 26.0 - 34.0 pg   MCHC 31.6 30.0 - 36.0 g/dL   RDW 84.6 (H) 96.2 - 95.2 %   Platelets 250 150 - 400 K/uL   nRBC 0.0 0.0 - 0.2 %   Neutrophils Relative % 82 %   Neutro Abs 21.7 (H) 1.7 - 7.7 K/uL   Lymphocytes Relative 11 %   Lymphs Abs 2.9 0.7 - 4.0 K/uL   Monocytes Relative 6 %   Monocytes Absolute 1.5 (H) 0.1 - 1.0 K/uL   Eosinophils Relative 0 %   Eosinophils Absolute 0.1 0.0 - 0.5 K/uL   Basophils Relative 0 %   Basophils Absolute 0.0 0.0 - 0.1 K/uL   WBC Morphology MORPHOLOGY UNREMARKABLE    RBC Morphology MORPHOLOGY UNREMARKABLE    Smear Review MORPHOLOGY UNREMARKABLE    Immature Granulocytes 1 %   Abs Immature Granulocytes 0.18 (H) 0.00 - 0.07 K/uL    Comment: Performed at [facility], [address], [place], [place] [address]  Comprehensive metabolic panel     Status: Abnormal   Collection Time: 03/11/23  3:56 AM  Result Value Ref Range   Sodium 134 (L) 135 - 145 mmol/L   Potassium 3.1 (L) 3.5 - 5.1 mmol/L   Chloride 100 98 - 111 mmol/L   CO2 27 22 - 32 mmol/L   Glucose, Bld 104 (H) 70 - 99 mg/dL    Comment: Glucose reference range applies only to samples taken after fasting for at least 8 hours.   BUN 7 6 - 20 mg/dL   Creatinine, Ser 4.40 (L) 0.44 - 1.00 mg/dL   Calcium 8.2 (L) 8.9 - 10.3 mg/dL   Total Protein 6.3 (L) 6.5 - 8.1 g/dL   Albumin 2.6 (L) 3.5 - 5.0 g/dL   AST 19 15 - 41 U/L   ALT 23 0 - 44 U/L   Alkaline Phosphatase 177 (H) 38 - 126 U/L   Total Bilirubin 0.5 0.0 - 1.2 mg/dL   GFR, Estimated >10 >27 mL/min    Comment: (NOTE) Calculated using the CKD-EPI Creatinine Equation (2021)    Anion gap 7 5 - 15    Comment:  Performed at [facility], [address], [place], [place] [address]  Magnesium     Status: Abnormal   Collection Time: 03/11/23  3:56 AM  Result Value Ref Range   Magnesium 1.6 (L) 1.7 - 2.4 mg/dL    Comment: Performed at [facility], [address] [place], [place] [address]  Phosphorus     Status: Abnormal   Collection Time: 03/11/23  3:56 AM  Result Value Ref Range   Phosphorus 2.3 (L) 2.5 - 4.6 mg/dL    Comment: Performed at [facility], [address], [place], [place] [address]  Heparin level (unfractionated)     Status: Abnormal   Collection Time: 03/11/23  3:56 AM  Result Value Ref Range   Heparin Unfractionated 0.12 (L) 0.30 - 0.70 IU/mL    Comment: (NOTE) The clinical reportable range upper limit is being lowered to >1.10 to align with the FDA approved guidance for the current laboratory assay.  If heparin results are below expected values, and patient dosage has  been confirmed, suggest follow up testing of antithrombin III levels. Performed at [facility], [address], [place], [place] [address]   Heparin level (unfractionated)     Status: Abnormal   Collection Time: 03/11/23 11:05 AM  Result Value Ref Range   Heparin Unfractionated 0.22 (L) 0.30 - 0.70 IU/mL    Comment: (NOTE) The clinical reportable range upper limit is being lowered to >1.10 to align with the FDA approved guidance for the current laboratory assay.  If heparin results are below expected values, and patient dosage has  been confirmed, suggest follow up testing of antithrombin III levels. Performed at [facility], [address], [place], [place] [address]    *Note: Due to a large number of results and/or encounters for the requested time period, some results have not been displayed. A complete set of results can be found in Results Review.   US Abdomen Limited RUQ (LIVER/GB) Result Date:  03/10/2023 CLINICAL DATA:  Right upper quadrant pain for 2 days. EXAM: ULTRASOUND ABDOMEN LIMITED RIGHT UPPER QUADRANT COMPARISON:  CT abdomen pelvis 03/09/2023 FINDINGS: Gallbladder: Dilated gallbladder with wall thickening measuring up to 7 mm. There is also sludge. Questionable stone. Common bile duct: Diameter: 2 mm.  Obscured by the known indwelling biliary stents. Liver: Homogeneous hepatic parenchyma. Tiny benign-appearing cyst in left hepatic lobe measuring 9 mm. This was seen previously. Portal vein is patent on color Doppler imaging with normal direction of blood flow towards the liver. Other: Trace ascites. IMPRESSION: No biliary dilatation but the common duct is partially obscured by the known biliary stents. Dilated gallbladder with sludge and questionable stone. There is also significant wall thickening. Please correlate for any clinical evidence of acute cholecystitis although with the patient's history there is a differential. Further workup such as HIDA scan as clinically appropriate. Mild ascites. Please correlate with prior CT Electronically Signed   By: Karen Kays M.D.   On: 03/10/2023 19:41      Assessment/Plan Cholecystitis  48 y/o F with stage IV rectal cancer with carcinomatosis who presents with severe fever, severe RUQ pain, vomiting, and leukocytosis. She has evidence of cholecystitis on her CT, RUQ U/S, and on her ERCP From 02/26/23. While she may have gallstones I suspect her cystic duct occlusion is related to progressive cancer in the area of her liver. Unfortunately she is not a candidate for cholecystectomy due to the degree of tumor burden in that area. Given obvious signs of infection and severe pain I think IR cholecystostomy tube for source control is the safest treatment option at this time; she has been on broad spectrum antibiotics for 1-2 days without significant improvement.    FEN - NPO for IR procedure  VTE - hep gtt for PVT, held for IR procedure  ID -  cefepime/flagyl Admit - [facility] service   Stage IV rectal CA w/ carcinomatosis followed  by Dr. Truett Perna, Dx in 2021, Hx LAR Biliary stricture followed by Dr. Meridee Score  HTN    I reviewed nursing notes, Consultant GI, oncology notes, hospitalist notes, last 24 h vitals and pain scores, last 48 h intake and output, last 24 h labs and trends, and last 24 h imaging results.  Adam Phenix, PA-C [facility] 03/11/2023, 12:01 PM Please see Amion for pager number during day hours 7:00am-4:30pm or 7:00am -11:30am on weekends

## 2023-03-11 NOTE — Consult Note (Signed)
 Chief Complaint: Right abdominal pain, nausea, cholecystitis, stage IV rectal cancer; referred for percutaneous cholecystostomy  Referring Provider(s): Simaan,L,PA-C  Supervising Physician: Roanna Banning  Patient Status: [facility] - In-pt  History of Present Illness: April [name] is a 48 y.o. 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 Meridee Score 02/26/23. Also with portal vein thrombus.  She was admitted to [facility] on 03/09/23 with RUQ abdominal pain, nausea, and vomiting. CT scan of the abdomen and pelvis on 2/24 revealed:    Again note is made of rectal carcinoma with peritoneal carcinomatosis. Trace ascites.   The level of nodular thickening in the liver hilum appears slightly increased from previous.   Indwelling biliary stents with some proving biliary ductal dilatation compared to the prior examination.   However there is a more heterogeneous appearance of the liver with more periportal edema and areas of potential developing portal vein peripheral branch thrombus particularly along the inferior aspect of the liver.   Bilateral nonobstructing renal stones.  Atrophy of the right kidney.    She is afebrile, WBC 26.4, hgb 8.9, plts 250k, t bili 0.5, creat 0.30, PT/INR nl; blood cx neg to date; she is on IV heparin , flagyl and maxipime. She has occluded cystic duct on cholangiogram/ ERCP from 2 weeks ago. She is not a  candidate for cholecystectomy due to  cancer in the area of the liver hilum. 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 Request now received from [facility] for perc cholecystostomy .       Patient is Full Code  Past Medical History:  Diagnosis Date   Ascending aortic aneurysm (HCC) 12/16/2022   Benign essential hypertension 10/06/2017   Last Assessment & Plan:  Formatting of this note might be different from the  original. Patient's blood pressure noted to be elevated at today's visit 188/98.  Patient will be provided with antihypertensives in the treatment center if needed in order to meet her Avastin parameters.  Patient recommended to follow up with her primary care physician regarding medication management.  Patient is not cur   Cancer [facility])    Change in bowel habits 06/28/2022   Colon cancer (HCC)    History of colon cancer 06/28/2022   Hypertension    Prolonged QT interval 01/07/2018   Renal calculi    bilateral   Sepsis due to undetermined organism (HCC) 12/24/2022   Sialoadenitis of submandibular gland 12/16/2022    Past Surgical History:  Procedure Laterality Date   BALLOON DILATION N/A 02/26/2023   Procedure: BALLOON DILATION;  Surgeon: Lemar Lofty., MD;  Location: [facility] ENDOSCOPY;  Service: Gastroenterology;  Laterality: N/A;   BILIARY BRUSHING  03/24/2022   Procedure: BILIARY BRUSHING;  Surgeon: Meridee Score Netty Starring., MD;  Location: [facility] ENDOSCOPY;  Service: Gastroenterology;;   BILIARY DILATION  03/24/2022   Procedure: BILIARY DILATION;  Surgeon: Lemar Lofty., MD;  Location: [facility] ENDOSCOPY;  Service: Gastroenterology;;   BILIARY STENT PLACEMENT  03/24/2022   Procedure: BILIARY STENT PLACEMENT;  Surgeon: Lemar Lofty., MD;  Location: [facility] ENDOSCOPY;  Service: Gastroenterology;;   BILIARY STENT PLACEMENT N/A 02/26/2023   Procedure: BILIARY STENT PLACEMENT;  Surgeon: Lemar Lofty., MD;  Location: [facility] ENDOSCOPY;  Service: Gastroenterology;  Laterality: N/A;   BIOPSY  03/24/2022   Procedure: BIOPSY;  Surgeon: Meridee Score Netty Starring., MD;  Location: [facility] ENDOSCOPY;  Service: Gastroenterology;;   BIOPSY  11/23/2022   Procedure:  BIOPSY;  Surgeon: Hilarie Fredrickson, MD;  Location: [facility] ENDOSCOPY;  Service: Gastroenterology;;   BIOPSY  02/26/2023   Procedure: BIOPSY;  Surgeon: Lemar Lofty., MD;  Location: [facility] ENDOSCOPY;  Service: Gastroenterology;;   CESAREAN  SECTION     x2   COLONOSCOPY N/A 11/23/2022   Procedure: COLONOSCOPY;  Surgeon: Hilarie Fredrickson, MD;  Location: [facility] ENDOSCOPY;  Service: Gastroenterology;  Laterality: N/A;   CYSTOSCOPY WITH RETROGRADE PYELOGRAM, URETEROSCOPY AND STENT PLACEMENT Right 10/22/2020   Procedure: CYSTOSCOPY WITH RETROGRADE PYELOGRAM, URETEROSCOPY AND STENT PLACEMENT;  Surgeon: Noel Christmas, MD;  Location: [facility] ORS;  Service: Urology;  Laterality: Right;   ENDOSCOPIC RETROGRADE CHOLANGIOPANCREATOGRAPHY (ERCP) WITH PROPOFOL N/A 02/26/2023   Procedure: ENDOSCOPIC RETROGRADE CHOLANGIOPANCREATOGRAPHY (ERCP) WITH PROPOFOL;  Surgeon: Meridee Score Netty Starring., MD;  Location: [facility] ENDOSCOPY;  Service: Gastroenterology;  Laterality: N/A;   ERCP N/A 03/24/2022   Procedure: ENDOSCOPIC RETROGRADE CHOLANGIOPANCREATOGRAPHY (ERCP);  Surgeon: Lemar Lofty., MD;  Location: [facility] ENDOSCOPY;  Service: Gastroenterology;  Laterality: N/A;   ESOPHAGOGASTRODUODENOSCOPY (EGD) WITH PROPOFOL N/A 02/26/2023   Procedure: ESOPHAGOGASTRODUODENOSCOPY (EGD) WITH PROPOFOL;  Surgeon: Meridee Score Netty Starring., MD;  Location: [facility] ENDOSCOPY;  Service: Gastroenterology;  Laterality: N/A;  patient needs EGD in addition to ERCP   IR PATIENT EVAL TECH 0-60 MINS  12/15/2022   IR RADIOLOGIST EVAL & MGMT  09/16/2019   IR RADIOLOGIST EVAL & MGMT  09/23/2019   IR RADIOLOGIST EVAL & MGMT  09/20/2019   IR RADIOLOGIST EVAL & MGMT  09/30/2019   IR REMOVAL TUN ACCESS W/ PORT W/O FL MOD SED  09/14/2019   IR VENO/EXT/UNI RIGHT  04/11/2022   PANCREATIC STENT PLACEMENT  03/24/2022   Procedure: PANCREATIC STENT PLACEMENT;  Surgeon: Lemar Lofty., MD;  Location: [facility] ENDOSCOPY;  Service: Gastroenterology;;   REMOVAL OF STONES  03/24/2022   Procedure: REMOVAL OF STONES;  Surgeon: Lemar Lofty., MD;  Location: [facility] ENDOSCOPY;  Service: Gastroenterology;;   REMOVAL OF STONES  02/26/2023   Procedure: REMOVAL OF Sludge;  Surgeon: Lemar Lofty., MD;  Location: [facility]  ENDOSCOPY;  Service: Gastroenterology;;   Dennison Mascot  03/24/2022   Procedure: Dennison Mascot;  Surgeon: Lemar Lofty., MD;  Location: [facility] ENDOSCOPY;  Service: Gastroenterology;;   Francine Graven REMOVAL  02/26/2023   Procedure: STENT REMOVAL;  Surgeon: Lemar Lofty., MD;  Location: [facility] ENDOSCOPY;  Service: Gastroenterology;;   URETERAL STENT PLACEMENT      Allergies: Lisinopril, Irinotecan, Leucovorin calcium, and Oxaliplatin  Medications: Prior to Admission medications   Medication Sig Start Date End Date Taking? Authorizing Provider  amLODipine (NORVASC) 10 MG tablet Take 1 tablet (10 mg total) by mouth daily. 11/24/22  Yes Rodolph Bong, MD  dexamethasone (DECADRON) 4 MG tablet Take 1 tablet by mouth twice a day for 3 days beginning day 2 of chemo. 02/03/23  Yes Rana Snare, NP  dicyclomine (BENTYL) 20 MG tablet Take 1 tablet (20 mg total) by mouth 3 (three) times daily as needed for spasms (rectal/AB pain). Patient taking differently: Take 20 mg by mouth 2 (two) times daily as needed for spasms (rectal/AB pain). 02/17/23  Yes Ladene Artist, MD  HYDROmorphone (DILAUDID) 2 MG tablet Take 1 tablet (2 mg total) by mouth every 6 (six) hours as needed for severe pain (pain score 7-10). Patient taking differently: Take 4 mg by mouth 3 (three) times daily as needed for severe pain (pain score 7-10). 02/27/23  Yes Meredeth Ide, MD  lactulose (CHRONULAC) 10 GM/15ML solution Take 15-30 mLs  by mouth daily as needed for mild constipation.   Yes [provider]  linaclotide (LINZESS) 72 MCG capsule Take 72 mcg by mouth daily as needed (constipation).   Yes [provider]  metoprolol tartrate (LOPRESSOR) 50 MG tablet Take 1 tablet (50 mg total) by mouth 2 (two) times daily. 11/24/22  Yes Rodolph Bong, MD  pantoprazole (PROTONIX) 40 MG tablet Take 1 tablet (40 mg total) by mouth daily before breakfast. 02/27/23  Yes Sharl Ma, [name] Ill, MD  polyethylene glycol  (MIRALAX / GLYCOLAX) 17 g packet Take 17 g by mouth daily as needed. Patient taking differently: Take 17 g by mouth daily as needed for moderate constipation or mild constipation. 02/27/23  Yes Sharl Ma, [name] Ill, MD  potassium chloride (MICRO-K) 10 MEQ CR capsule Take 2 capsules (20 mEq total) by mouth 2 (two) times daily. Patient taking differently: Take 30 mEq by mouth 2 (two) times daily. 11/24/22  Yes Rodolph Bong, MD  prochlorperazine (COMPAZINE) 10 MG tablet Take 1 tablet (10 mg total) by mouth every 6 (six) hours as needed for nausea or vomiting. 02/03/23  Yes Rana Snare, NP  doxycycline (ADOXA) 100 MG tablet Take 100 mg by mouth 2 (two) times daily. Patient not taking: Reported on 03/09/2023    [provider]  magic mouthwash (nystatin, diphenhydrAMINE, alum & mag hydroxide) suspension mixture Swish and Spit 5 mLs by mouth 4 (four) times daily as needed. Patient not taking: Reported on 02/17/2023 02/03/23   Rana Snare, NP  morphine (MS CONTIN) 30 MG 12 hr tablet Take 30 mg by mouth every 12 (twelve) hours. Patient not taking: Reported on 03/09/2023    [provider]  ondansetron (ZOFRAN) 8 MG tablet Take 1 tablet (8 mg total) by mouth every 8 (eight) hours as needed for nausea or vomiting. Can start taking 72 hours after chemotherapy Patient not taking: Reported on 03/09/2023 02/03/23   Rana Snare, NP     Family History  Problem Relation Age of Onset   Hypertension Mother    Diabetes Mellitus II Father    Stroke Father    Ulcers Sister    Colon cancer Neg Hx    Esophageal cancer Neg Hx    Inflammatory bowel disease Neg Hx    Liver disease Neg Hx    Pancreatic cancer Neg Hx    Rectal cancer Neg Hx    Stomach cancer Neg Hx     Social History   Socioeconomic History   Marital status: Single    Spouse name: Not on file   Number of children: Not on file   Years of education: Not on file   Highest education level: Not on file  Occupational History    Not on file  Tobacco Use   Smoking status: Former    Types: Cigarettes   Smokeless tobacco: Never   Tobacco comments:    Quit 1 month ago   Vaping Use   Vaping status: Never Used  Substance and Sexual Activity   Alcohol use: Not Currently   Drug use: Not Currently    Types: Marijuana   Sexual activity: Not Currently  Other Topics Concern   Not on file  Social History Narrative   Not on file   Social Drivers of Health   Financial Resource Strain: Low Risk  (11/11/2022)   Overall Financial Resource Strain (CARDIA)    Difficulty of Paying Living Expenses: Not hard at all  Food Insecurity: No Food Insecurity (03/09/2023)  Hunger Vital Sign    Worried About Running Out of Food in the Last Year: Never true    Ran Out of Food in the Last Year: Never true  Recent Concern: Food Insecurity - Food Insecurity Present (02/05/2023)   Hunger Vital Sign    Worried About Running Out of Food in the Last Year: Sometimes true    Ran Out of Food in the Last Year: Sometimes true  Transportation Needs: No Transportation Needs (03/09/2023)   PRAPARE - Administrator, Civil Service (Medical): No    Lack of Transportation (Non-Medical): No  Physical Activity: Inactive (11/11/2022)   Exercise Vital Sign    Days of Exercise per Week: 0 days    Minutes of Exercise per Session: 0 min  Stress: No Stress Concern Present (11/11/2022)   [employer] of Occupational Health - Occupational Stress Questionnaire    Feeling of Stress : Not at all  Social Connections: Socially Isolated (01/24/2023)   Social Connection and Isolation Panel [NHANES]    Frequency of Communication with Friends and Family: More than three times a week    Frequency of Social Gatherings with Friends and Family: More than three times a week    Attends Religious Services: Never    Database administrator or Organizations: No    Attends Engineer, structural: Never    Marital Status: Never married        Review of Systems see above; denies fever, CP,dyspnea, or bleeding, vomiting; does have HA, abd/back pain,nausea  Vital Signs: BP (!) 144/102 (BP Location: Right Arm)   Pulse 82   Temp 97.8 F (36.6 C) (Oral)   Resp 18   Ht 5\' 4"  (1.626 m)   Wt 137 lb 9.1 oz (62.4 kg)   LMP 01/07/2006   SpO2 97%   BMI 23.61 kg/m   Advance Care Plan: The advanced care place/surrogate decision maker was discussed at the time of visit and the patient did not wish to discuss or was not able to name a surrogate decision maker or provide an advance care plan.  Physical Exam; awake/alert; chest- CTA bilat; heart- RRR; abd-soft,few BS, sl distended; mod primarily RUQ tenderness to palpation with some guarding; no LE edema  Imaging: US Abdomen Limited RUQ (LIVER/GB) Result Date: 03/10/2023 CLINICAL DATA:  Right upper quadrant pain for 2 days. EXAM: ULTRASOUND ABDOMEN LIMITED RIGHT UPPER QUADRANT COMPARISON:  CT abdomen pelvis 03/09/2023 FINDINGS: Gallbladder: Dilated gallbladder with wall thickening measuring up to 7 mm. There is also sludge. Questionable stone. Common bile duct: Diameter: 2 mm.  Obscured by the known indwelling biliary stents. Liver: Homogeneous hepatic parenchyma. Tiny benign-appearing cyst in left hepatic lobe measuring 9 mm. This was seen previously. Portal vein is patent on color Doppler imaging with normal direction of blood flow towards the liver. Other: Trace ascites. IMPRESSION: No biliary dilatation but the common duct is partially obscured by the known biliary stents. Dilated gallbladder with sludge and questionable stone. There is also significant wall thickening. Please correlate for any clinical evidence of acute cholecystitis although with the patient's history there is a differential. Further workup such as HIDA scan as clinically appropriate. Mild ascites. Please correlate with prior CT Electronically Signed   By: Karen Kays M.D.   On: 03/10/2023 19:41   CT ABDOMEN PELVIS  W CONTRAST Result Date: 03/09/2023 CLINICAL DATA:  Rectal cancer with liver metastases with abdominal pain. * Tracking Code: BO * EXAM: CT ABDOMEN AND PELVIS WITH CONTRAST TECHNIQUE:  Multidetector CT imaging of the abdomen and pelvis was performed using the standard protocol following bolus administration of intravenous contrast. RADIATION DOSE REDUCTION: This exam was performed according to the departmental dose-optimization program which includes automated exposure control, adjustment of the mA and/or kV according to patient size and/or use of iterative reconstruction technique. CONTRAST:  OMNIPAQUE IOHEXOL 300 MG/ML  SOLN COMPARISON:  CT 02/24/2023. FINDINGS: Lower chest: There is some linear opacity lung bases likely scar or atelectasis. No pleural effusion. Hepatobiliary: There is areas of fatty liver infiltration particularly geographically along the right hepatic lobe more so than left. This is progressive from previous examination. Portal vein is patent grossly but somewhat narrowed by extrinsic compression. There are indwelling endoscopically placed stents. Level of intrahepatic biliary duct dilatation is slightly improving on the left and some on the right. The gallbladder continues to be dilated with some wall edema which is increasing. There is more nodular tissue extending up along the porta hepatis as best seen on coronal series 8, image 49. This has a differential including progression of disease. There are some areas of progressive portal vein occlusion such is anteriorly in the right hepatic lobe inferiorly best seen on coronal imaging. Pancreas: No pancreatic mass. Persistent pancreas ductal dilatation, similar to previous. Spleen: Normal in size without focal abnormality. Adrenals/Urinary Tract: Adrenal glands are preserved. Bilateral nonobstructing renal stones. No enhancing mass or collecting system dilatation. Preserved contour to the urinary bladder. Areas of atrophy in the right kidney  are again noted. Stomach/Bowel: On this non oral contrast exam large bowel has a normal course and caliber with scattered stool. There are loops of decompressed transverse colon. The small bowel is nondilated. Along the rectum once again is nodular wall thickening with extension into the adjacent tissue consistent with known history of neoplasm. Nodular areas are seen extending perirectal and presacral spaces. Vascular/Lymphatic: Normal caliber aorta and IVC. There is ectasia of the left common iliac artery with diameter approaching 19 mm. Scattered atherosclerotic calcified plaque. Several small less than 1 cm size in short axis retroperitoneal nodes are seen, not pathologic by size criteria. Reproductive: Uterus is present. Other: Peritoneal carcinomatosis again identified. Example measured previously anterior in the central upper abdomen with transverse dimension of 2.2 cm. Today this is measured 2.5 cm. Other areas are also similar including the pericolic gutters. There is also thickening along the surface of the liver towards the dome on the right side and laterally. Some these areas appear more prominent today than the recent prior this could be technical. Musculoskeletal: Curvature of the spine with some degenerative changes. 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 IMPRESSION: Again note is made of rectal carcinoma with peritoneal carcinomatosis. Trace ascites. The level of nodular thickening in the liver hilum appears slightly increased from previous. Indwelling biliary stents with some proving biliary ductal dilatation compared to the prior examination. However there is a more heterogeneous appearance of the liver with more periportal edema and areas of potential developing portal vein peripheral branch thrombus particularly along the inferior aspect of the liver. Bilateral nonobstructing renal stones.  Atrophy of the right kidney. Critical Value/emergent results were called by telephone at the  time of interpretation on 03/09/2023 at 11:28 am to provider [facility] , who verbally acknowledged these results. Electronically Signed   By: Karen Kays M.D.   On: 03/09/2023 11:49   DG Chest Port 1 View Result Date: 03/09/2023 CLINICAL DATA:  48 year old female with possible sepsis. Metastatic rectal cancer. EXAM: PORTABLE  CHEST 1 VIEW COMPARISON:  CT Chest, Abdomen, and Pelvis 01/24/2023 and earlier. FINDINGS: Portable AP semi upright view at 0949 hours. Lung volumes and mediastinal contours remain normal. Left side PICC line remains in place. Visualized tracheal air column is within normal limits. Allowing for portable technique the lungs are clear. No pneumothorax or pleural effusion. No evidence of pneumoperitoneum. Negative visible bowel gas. No acute osseous abnormality identified. IMPRESSION: No acute cardiopulmonary abnormality. Electronically Signed   By: Odessa Fleming M.D.   On: 03/09/2023 10:23   DG ERCP Result Date: 02/27/2023 CLINICAL DATA:  Encounter for removal of biliary stent. EXAM: ERCP TECHNIQUE: Multiple spot images obtained with the fluoroscopic device and submitted for interpretation post-procedure. FLUOROSCOPY: Radiation Exposure Index (as provided by the fluoroscopic device): 106.44 mGy Kerma COMPARISON:  CT 02/24/2023 FINDINGS: Initial image demonstrates 2 plastic biliary stents. Biliary stents removed. Retrograde cholangiogram was performed. Cholangiogram images demonstrate narrowing and irregularity near the biliary confluence and involving the left and right hepatic ducts. Wires were advanced into the biliary ducts. Evidence for balloon dilatation of the biliary ducts. Final images demonstrate placement of 2 new plastic biliary stents presumably in the left and right hepatic ducts. IMPRESSION: 1. 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 These areas were dilated and new biliary stents were placed. These images were submitted  for radiologic interpretation only. Please see the procedural report. Electronically Signed   By: Richarda Overlie M.D.   On: 02/27/2023 09:03   CT ABDOMEN PELVIS W CONTRAST Result Date: 02/24/2023 CLINICAL DATA:  Abdominal pain EXAM: CT ABDOMEN AND PELVIS WITH CONTRAST TECHNIQUE: Multidetector CT imaging of the abdomen and pelvis was performed using the standard protocol following bolus administration of intravenous contrast. RADIATION DOSE REDUCTION: This exam was performed according to the departmental dose-optimization program which includes automated exposure control, adjustment of the mA and/or kV according to patient size and/or use of iterative reconstruction technique. CONTRAST:  75mL OMNIPAQUE IOHEXOL 300 MG/ML  SOLN COMPARISON:  01/24/2023 FINDINGS: Lower chest: No acute abnormality. Hepatobiliary: Redemonstration of subtly hypoattenuating lesion within the peripheral aspect of hepatic segment 2 (series 2, image 20). Additional scattered subcentimeter low-density lesions within the liver remain too small to characterize. Prominent intrahepatic biliary dilatation with biliary stents in place. Slightly hyperattenuating material fills the gallbladder. No pericholecystic inflammatory changes by CT. Pancreas: Stable mild pancreatic ductal dilatation. No inflammatory changes. Spleen: Normal in size without focal abnormality. Adrenals/Urinary Tract: Unremarkable adrenal glands. Stable bilateral nephrolithiasis with stones measuring up to 1.1 cm on the left and 0.9 cm on the right. No hydronephrosis. Urinary bladder within normal limits for the degree of distension. Stomach/Bowel: Stomach is within normal limits. Appendix appears normal. Similar degree of wall thickening of the rectosigmoid colon. No new sites of bowel wall thickening or inflammation. Vascular/Lymphatic: Scattered aortoiliac atherosclerotic calcifications without aneurysm. No abdominopelvic lymphadenopathy. Reproductive: Uterus and bilateral adnexa  are unremarkable. Other: Similar degree of nodularity along the omentum and left pericolic gutter. No ascites or pneumoperitoneum. Musculoskeletal: No lytic or sclerotic bone lesion. Grade 2 anterolisthesis of L5 on S1 secondary to chronic bilateral L5 pars interarticularis defects. IMPRESSION: 1. No new or acute abdominopelvic findings. 2. 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 3. Prominent intrahepatic biliary dilatation with biliary stents in place. 4. Stable bilateral nephrolithiasis. 5. Aortic atherosclerosis (ICD10-I70.0). Electronically Signed   By: Duanne Guess D.O.   On: 02/24/2023 18:49    Labs:  CBC:  Recent Labs    02/27/23 0540 03/09/23 0943 03/09/23 0944 03/10/23 0348 03/11/23 0356  WBC 9.1  --  16.5* 27.2* 26.4*  HGB 10.1* 13.3 12.1 9.8* 8.9*  HCT 31.1* 39.0 37.7 30.3* 28.2*  PLT 329  --  387 292 250    COAGS: Recent Labs    12/24/22 0050 03/09/23 0944  INR 1.0 1.1  APTT 31 33    BMP: Recent Labs    03/03/23 0945 03/09/23 0943 03/09/23 0944 03/10/23 0348 03/11/23 0356  NA 138 136 137 136 134*  K 3.1* 2.8* 2.7* 3.6 3.1*  CL 100 94* 96* 102 100  CO2 27  --  28 28 27   GLUCOSE 122* 111* 110* 94 104*  BUN 8 4* 6 9 7   CALCIUM 8.9  --  9.4 8.5* 8.2*  CREATININE 0.52 0.50 0.43* 0.48 0.30*  GFRNONAA >60  --  >60 >60 >60    LIVER FUNCTION TESTS: Recent Labs    03/03/23 0945 03/09/23 0944 03/10/23 0348 03/11/23 0356  BILITOT 0.2 0.6 1.1 0.5  AST 14* 25 29 19   ALT 11 32 29 23  ALKPHOS 286* 216* 191* 177*  PROT 6.8 8.0 6.7 6.3*  ALBUMIN 3.4* 3.6 2.8* 2.6*    TUMOR MARKERS: Recent Labs    11/13/22 1311 12/03/22 1254 02/17/23 0750 03/03/23 0945  CEA 3.47 5.90* 6.47* 5.72*    Assessment and Plan: 48 y.o. 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  Meridee Score 02/26/23. Also with portal vein thrombus.  She was admitted to [facility] on 03/09/23 with RUQ abdominal pain, nausea, and vomiting. CT scan of the abdomen and pelvis on 2/24 revealed:    Again note is made of rectal carcinoma with peritoneal carcinomatosis. Trace ascites.   The level of nodular thickening in the liver hilum appears slightly increased from previous.   Indwelling biliary stents with some proving biliary ductal dilatation compared to the prior examination.   However there is a more heterogeneous appearance of the liver with more periportal edema and areas of potential developing portal vein peripheral branch thrombus particularly along the inferior aspect of the liver.   Bilateral nonobstructing renal stones.  Atrophy of the right kidney.  She is known to IR team from port removal/PICC placement in 2021, PICC placements 2022, and 2024.  She is afebrile, WBC 26.4, hgb 8.9, plts 250k, t bili 0.5, creat 0.30, PT/INR nl; blood cx neg to date; she is on IV heparin , flagyl and maxipime. She has occluded cystic duct on cholangiogram/ ERCP from 2 weeks ago. She is not a  candidate for cholecystectomy due to  cancer in the area of the liver hilum. 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 Request now received from [facility] for perc cholecystostomy .Imaging studies were  reviewed by Dr. Milford Cage. Risks and benefits discussed with the patient including bleeding, infection, damage to adjacent structures,  and sepsis.  All of the patient's questions were answered, patient is agreeable to proceed. Consent signed and in chart. Procedure tent scheduled for later today; IV heparin has been held    Thank you for allowing our service to participate in Lilas Diefendorf 's care.  Electronically Signed: D. Jeananne Rama, PA-C   03/11/2023, 12:52 PM      I spent a total of 40 Minutes    in face to face in clinical consultation, greater than 50% of which was  counseling/coordinating care for percutaneous cholecystostomy

## 2023-03-11 NOTE — Progress Notes (Addendum)
 April [name]   DOB:08-18-1975   UJ#:[id number]      ASSESSMENT & PLAN:  1.  Severe abdominal pain Nausea/vomiting - Admitted via ED 03/09/2023 with complaints of severe abdominal pain - CT scan abdomen pelvis done 03/09/2023, nodular thickening in the liver hilum slightly increased from previous.  Indwelling biliary stents showed improving biliary ductal dilatation. - Admitted and discharged 02/27/2023 with same complaints.  ERCP with stent exchange done 02/26/2023. - Oral Dilaudid at home was prescribed as patient reported no relief from MS Contin or hydrocodone.  Continue pain management during this hospitalization. - Antiemetics as ordered - GI evaluation done, no intervention at this point. - Abdominal ultrasound done 03/10/2023 shows dilated gallbladder with sludge and questionable stone. - HIDA scan ordered, will follow results. - Will continue to monitor   2.  Sigmoid colon cancer, stage IV - Initially diagnosed 2021 - Status post FOLFIRI/bevacizumab and Udenyca given in 2021.  From 2022 to 2023 FOLFIRI with panitumumab was given.   -Maintenance Xeloda was given in 2022.  Lonsurf was given in 2023. Then in 2024 patient received FOLFOX therapy.   -Currently on salvage therapy with irinotecan/panitumumab.  Received cycle four 02/17/2023 and tolerated well.     -Seen in outpatient oncology on 03/03/2023.  She had an outpatient appointment scheduled for today which will be rescheduled upon discharge. - Medical oncology/Dr. Truett Perna following closely   3.  Anemia - Likely due to malignancy and recent chemotherapy - Hemoglobin 8.9 stable - Transfuse PRBC for Hgb <7.0.  No transfusional intervention indicated at this time - Continue to monitor CBC with differential   4.  Leukocytosis - WBC elevated 26.4 today - Continue IV antibiotics - Monitor fever curve   5.  Hypertension - Continue antihypertensives - Monitor BP levels      Code Status Full  Subjective:   Patient seen awake and alert laying supine in bed.  Reports ongoing right-sided lower quadrant abdominal pain is pretty much the same, she gets some relief when she takes pain meds.  Currently n.p.o. for HIDA scan.  No other acute distress is noted.  Objective:  Vitals:   03/11/23 0219 03/11/23 0556  BP: (!) 117/90 116/89  Pulse: 96 91  Resp: 18 19  Temp: 98.4 F (36.9 C) 98.2 F (36.8 C)  SpO2: 100% 100%     Intake/Output Summary (Last 24 hours) at 03/11/2023 9562 Last data filed at 03/11/2023 0447 Gross per 24 hour  Intake 1899.51 ml  Output --  Net 1899.51 ml     REVIEW OF SYSTEMS:   Constitutional: + Fatigue, denies fevers, chills or abnormal night sweats Eyes: Denies blurriness of vision, double vision or watery eyes Ears, nose, mouth, throat, and face: Denies mucositis or sore throat Respiratory: Denies cough, dyspnea or wheezes Cardiovascular: Denies palpitation, chest discomfort or lower extremity swelling Gastrointestinal: + Abdominal pain  skin: Denies abnormal skin rashes Lymphatics: Denies new lymphadenopathy or easy bruising Neurological: Denies numbness, tingling or new weaknesses Behavioral/Psych: Mood is stable, no new changes  All other systems were reviewed with the patient and are negative.  PHYSICAL EXAMINATION: ECOG PERFORMANCE STATUS: 2 - Symptomatic, <50% confined to bed  Vitals:   03/11/23 0219 03/11/23 0556  BP: (!) 117/90 116/89  Pulse: 96 91  Resp: 18 19  Temp: 98.4 F (36.9 C) 98.2 F (36.8 C)  SpO2: 100% 100%   Filed Weights   03/09/23 2042  Weight: 137 lb 9.1 oz (62.4 kg)    GENERAL: alert, +  mild distress and comfortable SKIN: skin color, texture, turgor are normal, no rashes or significant lesions EYES: normal, conjunctiva are pink and non-injected, sclera clear OROPHARYNX: no exudate, no erythema and lips, buccal mucosa, and tongue normal  NECK: supple, thyroid normal size, non-tender, without nodularity LYMPH: no palpable  lymphadenopathy in the cervical, axillary or inguinal LUNGS: clear to auscultation and percussion with normal breathing effort HEART: regular rate & rhythm and no murmurs and no lower extremity edema ABDOMEN: + Abdominal distention and tender MUSCULOSKELETAL: no cyanosis of digits and no clubbing  PSYCH: alert & oriented x 3 with fluent speech NEURO: no focal motor/sensory deficits   All questions were answered. The patient knows to call the clinic with any problems, questions or concerns.   The total time spent in the appointment was 40 minutes encounter with patient including review of chart and various tests results, discussions about plan of care and coordination of care plan  Dawson Bills, NP 03/11/2023 9:21 AM    Labs Reviewed:  Lab Results  Component Value Date   WBC 26.4 (H) 03/11/2023   HGB 8.9 (L) 03/11/2023   HCT 28.2 (L) 03/11/2023   MCV 97.6 03/11/2023   PLT 250 03/11/2023   Recent Labs    03/27/22 1212 03/31/22 0945 11/21/22 0557 11/22/22 0710 12/16/22 0505 12/17/22 0500 03/09/23 0944 03/10/23 0348 03/11/23 0356  NA  --    < > 138   < > 132*   < > 137 136 134*  K 3.8   < > 3.5   < > 2.9*   < > 2.7* 3.6 3.1*  CL  --    < > 101   < > 97*   < > 96* 102 100  CO2  --    < > 24   < > 29   < > 28 28 27   GLUCOSE  --    < > 139*   < > 97   < > 110* 94 104*  BUN  --    < > 9   < > 8   < > 6 9 7   CREATININE  --    < > 0.58   < > 0.52   < > 0.43* 0.48 0.30*  CALCIUM  --    < > 10.3   < > 8.5*   < > 9.4 8.5* 8.2*  GFRNONAA  --    < > >60   < > >60   < > >60 >60 >60  PROT 6.8   < > 8.9*   < > 7.1   < > 8.0 6.7 6.3*  ALBUMIN 3.2*   < > 4.4  4.5   < > 3.2*   < > 3.6 2.8* 2.6*  AST 78*   < > 35   < > 37   < > 25 29 19   ALT 172*   < > 24   < > 28   < > 32 29 23  ALKPHOS 506*   < > 172*   < > 204*   < > 216* 191* 177*  BILITOT 2.3*   < > 0.6   < > 0.5   < > 0.6 1.1 0.5  BILIDIR 1.1*  --  <0.1  --  0.1  --   --   --   --   IBILI 1.2*  --  NOT CALCULATED  --  0.4  --    --   --   --    < > =  values in this interval not displayed.    Studies Reviewed:  US Abdomen Limited RUQ (LIVER/GB) Result Date: 03/10/2023 CLINICAL DATA:  Right upper quadrant pain for 2 days. EXAM: ULTRASOUND ABDOMEN LIMITED RIGHT UPPER QUADRANT COMPARISON:  CT abdomen pelvis 03/09/2023 FINDINGS: Gallbladder: Dilated gallbladder with wall thickening measuring up to 7 mm. There is also sludge. Questionable stone. Common bile duct: Diameter: 2 mm.  Obscured by the known indwelling biliary stents. Liver: Homogeneous hepatic parenchyma. Tiny benign-appearing cyst in left hepatic lobe measuring 9 mm. This was seen previously. Portal vein is patent on color Doppler imaging with normal direction of blood flow towards the liver. Other: Trace ascites. IMPRESSION: No biliary dilatation but the common duct is partially obscured by the known biliary stents. Dilated gallbladder with sludge and questionable stone. There is also significant wall thickening. Please correlate for any clinical evidence of acute cholecystitis although with the patient's history there is a differential. Further workup such as HIDA scan as clinically appropriate. Mild ascites. Please correlate with prior CT Electronically Signed   By: Karen Kays M.D.   On: 03/10/2023 19:41   CT ABDOMEN PELVIS W CONTRAST Result Date: 03/09/2023 CLINICAL DATA:  Rectal cancer with liver metastases with abdominal pain. * Tracking Code: BO * EXAM: CT ABDOMEN AND PELVIS WITH CONTRAST TECHNIQUE: Multidetector CT imaging of the abdomen and pelvis was performed using the standard protocol following bolus administration of intravenous contrast. RADIATION DOSE REDUCTION: This exam was performed according to the departmental dose-optimization program which includes automated exposure control, adjustment of the mA and/or kV according to patient size and/or use of iterative reconstruction technique. CONTRAST:  OMNIPAQUE IOHEXOL 300 MG/ML  SOLN COMPARISON:  CT  02/24/2023. FINDINGS: Lower chest: There is some linear opacity lung bases likely scar or atelectasis. No pleural effusion. Hepatobiliary: There is areas of fatty liver infiltration particularly geographically along the right hepatic lobe more so than left. This is progressive from previous examination. Portal vein is patent grossly but somewhat narrowed by extrinsic compression. There are indwelling endoscopically placed stents. Level of intrahepatic biliary duct dilatation is slightly improving on the left and some on the right. The gallbladder continues to be dilated with some wall edema which is increasing. There is more nodular tissue extending up along the porta hepatis as best seen on coronal series 8, image 49. This has a differential including progression of disease. There are some areas of progressive portal vein occlusion such is anteriorly in the right hepatic lobe inferiorly best seen on coronal imaging. Pancreas: No pancreatic mass. Persistent pancreas ductal dilatation, similar to previous. Spleen: Normal in size without focal abnormality. Adrenals/Urinary Tract: Adrenal glands are preserved. Bilateral nonobstructing renal stones. No enhancing mass or collecting system dilatation. Preserved contour to the urinary bladder. Areas of atrophy in the right kidney are again noted. Stomach/Bowel: On this non oral contrast exam large bowel has a normal course and caliber with scattered stool. There are loops of decompressed transverse colon. The small bowel is nondilated. Along the rectum once again is nodular wall thickening with extension into the adjacent tissue consistent with known history of neoplasm. Nodular areas are seen extending perirectal and presacral spaces. Vascular/Lymphatic: Normal caliber aorta and IVC. There is ectasia of the left common iliac artery with diameter approaching 19 mm. Scattered atherosclerotic calcified plaque. Several small less than 1 cm size in short axis retroperitoneal  nodes are seen, not pathologic by size criteria. Reproductive: Uterus is present. Other: Peritoneal carcinomatosis again identified. Example measured previously anterior  in the central upper abdomen with transverse dimension of 2.2 cm. Today this is measured 2.5 cm. Other areas are also similar including the pericolic gutters. There is also thickening along the surface of the liver towards the dome on the right side and laterally. Some these areas appear more prominent today than the recent prior this could be technical. Musculoskeletal: Curvature of the spine with some degenerative changes. 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 IMPRESSION: Again note is made of rectal carcinoma with peritoneal carcinomatosis. Trace ascites. The level of nodular thickening in the liver hilum appears slightly increased from previous. Indwelling biliary stents with some proving biliary ductal dilatation compared to the prior examination. However there is a more heterogeneous appearance of the liver with more periportal edema and areas of potential developing portal vein peripheral branch thrombus particularly along the inferior aspect of the liver. Bilateral nonobstructing renal stones.  Atrophy of the right kidney. Critical Value/emergent results were called by telephone at the time of interpretation on 03/09/2023 at 11:28 am to provider [facility] , who verbally acknowledged these results. Electronically Signed   By: Karen Kays M.D.   On: 03/09/2023 11:49   DG Chest Port 1 View Result Date: 03/09/2023 CLINICAL DATA:  48 year old female with possible sepsis. Metastatic rectal cancer. EXAM: PORTABLE CHEST 1 VIEW COMPARISON:  CT Chest, Abdomen, and Pelvis 01/24/2023 and earlier. FINDINGS: Portable AP semi upright view at 0949 hours. Lung volumes and mediastinal contours remain normal. Left side PICC line remains in place. Visualized tracheal air column is within normal limits. Allowing for portable technique the  lungs are clear. No pneumothorax or pleural effusion. No evidence of pneumoperitoneum. Negative visible bowel gas. No acute osseous abnormality identified. IMPRESSION: No acute cardiopulmonary abnormality. Electronically Signed   By: Odessa Fleming M.D.   On: 03/09/2023 10:23   DG ERCP Result Date: 02/27/2023 CLINICAL DATA:  Encounter for removal of biliary stent. EXAM: ERCP TECHNIQUE: Multiple spot images obtained with the fluoroscopic device and submitted for interpretation post-procedure. FLUOROSCOPY: Radiation Exposure Index (as provided by the fluoroscopic device): 106.44 mGy Kerma COMPARISON:  CT 02/24/2023 FINDINGS: Initial image demonstrates 2 plastic biliary stents. Biliary stents removed. Retrograde cholangiogram was performed. Cholangiogram images demonstrate narrowing and irregularity near the biliary confluence and involving the left and right hepatic ducts. Wires were advanced into the biliary ducts. Evidence for balloon dilatation of the biliary ducts. Final images demonstrate placement of 2 new plastic biliary stents presumably in the left and right hepatic ducts. IMPRESSION: 1. 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 These areas were dilated and new biliary stents were placed. These images were submitted for radiologic interpretation only. Please see the procedural report. Electronically Signed   By: Richarda Overlie M.D.   On: 02/27/2023 09:03   CT ABDOMEN PELVIS W CONTRAST Result Date: 02/24/2023 CLINICAL DATA:  Abdominal pain EXAM: CT ABDOMEN AND PELVIS WITH CONTRAST TECHNIQUE: Multidetector CT imaging of the abdomen and pelvis was performed using the standard protocol following bolus administration of intravenous contrast. RADIATION DOSE REDUCTION: This exam was performed according to the departmental dose-optimization program which includes automated exposure control, adjustment of the mA and/or kV according to patient size and/or use of  iterative reconstruction technique. CONTRAST:  75mL OMNIPAQUE IOHEXOL 300 MG/ML  SOLN COMPARISON:  01/24/2023 FINDINGS: Lower chest: No acute abnormality. Hepatobiliary: Redemonstration of subtly hypoattenuating lesion within the peripheral aspect of hepatic segment 2 (series 2, image 20). Additional scattered subcentimeter low-density  lesions within the liver remain too small to characterize. Prominent intrahepatic biliary dilatation with biliary stents in place. Slightly hyperattenuating material fills the gallbladder. No pericholecystic inflammatory changes by CT. Pancreas: Stable mild pancreatic ductal dilatation. No inflammatory changes. Spleen: Normal in size without focal abnormality. Adrenals/Urinary Tract: Unremarkable adrenal glands. Stable bilateral nephrolithiasis with stones measuring up to 1.1 cm on the left and 0.9 cm on the right. No hydronephrosis. Urinary bladder within normal limits for the degree of distension. Stomach/Bowel: Stomach is within normal limits. Appendix appears normal. Similar degree of wall thickening of the rectosigmoid colon. No new sites of bowel wall thickening or inflammation. Vascular/Lymphatic: Scattered aortoiliac atherosclerotic calcifications without aneurysm. No abdominopelvic lymphadenopathy. Reproductive: Uterus and bilateral adnexa are unremarkable. Other: Similar degree of nodularity along the omentum and left pericolic gutter. No ascites or pneumoperitoneum. Musculoskeletal: No lytic or sclerotic bone lesion. Grade 2 anterolisthesis of L5 on S1 secondary to chronic bilateral L5 pars interarticularis defects. IMPRESSION: 1. No new or acute abdominopelvic findings. 2. 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 3. Prominent intrahepatic biliary dilatation with biliary stents in place. 4. Stable bilateral nephrolithiasis. 5. Aortic atherosclerosis (ICD10-I70.0). Electronically Signed   By: Duanne Guess D.O.   On: 02/24/2023 18:49   Ms. Delacruz was interviewed and examined.  She continues to have severe right abdomen pain..  The fever has improved.  I suspect she has cholecystitis in the setting of right upper quadrant tumor.  A cholecystostomy tube has been recommended by the surgical service.  Recommendations: Continue antibiotics Management of the apparent cholecystitis per the surgical service Continue narcotic analgesics for pain Outpatient follow-up at the cancer center

## 2023-03-11 NOTE — Procedures (Signed)
 Vascular and Interventional Radiology Procedure Note  Patient: April [name] DOB: 05-05-1975 Medical Record Number: [id number] Note Date/Time: 03/11/23 2:04 PM   Performing Physician: Roanna Banning, MD Assistant(s): None  Diagnosis: Acute cholecystitis  Procedure:  CHOLECYSTOSTOMY TUBE PLACEMENT ANTEROGRADE CHOLANGIOGRAM  Anesthesia: Conscious Sedation Complications: None Estimated Blood Loss: Minimal Specimens:  None  Findings:  Successful placement of 13F cholecystostomy tube.  Plan: Flush tube w 5 mL sterile NS q8h and record drain output qShift. Follow up for routine tube evaluation in 6 week(s).   See detailed procedure note with images in PACS. The patient tolerated the procedure well without incident or complication and was returned to Recovery in stable condition.    Roanna Banning, MD [facility]   Pager. [phone] Clinic. [phone]

## 2023-03-11 NOTE — Progress Notes (Signed)
 PHARMACY - ANTICOAGULATION CONSULT NOTE  Pharmacy Consult for heparin Indication: portal vein thrombus  Allergies  Allergen Reactions   Lisinopril Swelling and Other (See Comments)    Lip swelling and Angioedema   Irinotecan Other (See Comments)    Stomach cramping Patient given 0.5 mL atropine IV and was able to continue infusion.  See Progress note on 10/03/20.    Leucovorin Calcium Itching    Patient reported itchy palms towards end of transfusion, see progress note 08/04/22.    Oxaliplatin Itching    Patient reported itchy palms towards end of transfusion, see progress note on 08/04/22.    Patient Measurements: Height: 5\' 4"  (162.6 cm) Weight: 62.4 kg (137 lb 9.1 oz) IBW/kg (Calculated) : 54.7 Heparin Dosing Weight: 62.4 kg  Vital Signs: Temp: 98.4 F (36.9 C) (02/26 0219) Temp Source: Oral (02/26 0219) BP: 117/90 (02/26 0219) Pulse Rate: 96 (02/26 0219)  Labs: Recent Labs    03/09/23 0944 03/10/23 0348 03/10/23 2021 03/11/23 0356  HGB 12.1 9.8*  --  8.9*  HCT 37.7 30.3*  --  28.2*  PLT 387 292  --  250  APTT 33  --   --   --   LABPROT 13.9  --   --   --   INR 1.1  --   --   --   HEPARINUNFRC  --   --  0.10* 0.12*  CREATININE 0.43* 0.48  --  0.30*    Estimated Creatinine Clearance: 73.5 mL/min (A) (by C-G formula based on SCr of 0.3 mg/dL (L)).   Medical History: Past Medical History:  Diagnosis Date   Ascending aortic aneurysm (HCC) 12/16/2022   Benign essential hypertension 10/06/2017   Last Assessment & Plan:  Formatting of this note might be different from the original. Patient's blood pressure noted to be elevated at today's visit 188/98.  Patient will be provided with antihypertensives in the treatment center if needed in order to meet her Avastin parameters.  Patient recommended to follow up with her primary care physician regarding medication management.  Patient is not cur   Cancer (HCC)    Change in bowel habits 06/28/2022   Colon cancer (HCC)     History of colon cancer 06/28/2022   Hypertension    Prolonged QT interval 01/07/2018   Renal calculi    bilateral   Sepsis due to undetermined organism (HCC) 12/24/2022   Sialoadenitis of submandibular gland 12/16/2022    Medications:  Medications Prior to Admission  Medication Sig Dispense Refill Last Dose/Taking   amLODipine (NORVASC) 10 MG tablet Take 1 tablet (10 mg total) by mouth daily. 90 tablet 0 Past Week   dexamethasone (DECADRON) 4 MG tablet Take 1 tablet by mouth twice a day for 3 days beginning day 2 of chemo. 12 tablet 2 Past Month   dicyclomine (BENTYL) 20 MG tablet Take 1 tablet (20 mg total) by mouth 3 (three) times daily as needed for spasms (rectal/AB pain). (Patient taking differently: Take 20 mg by mouth 2 (two) times daily as needed for spasms (rectal/AB pain).) 60 tablet 1 03/04/2023   HYDROmorphone (DILAUDID) 2 MG tablet Take 1 tablet (2 mg total) by mouth every 6 (six) hours as needed for severe pain (pain score 7-10). (Patient taking differently: Take 4 mg by mouth 3 (three) times daily as needed for severe pain (pain score 7-10).) 30 tablet 0 03/08/2023   lactulose (CHRONULAC) 10 GM/15ML solution Take 15-30 mLs by mouth daily as needed for mild constipation.  Taking As Needed   linaclotide (LINZESS) 72 MCG capsule Take 72 mcg by mouth daily as needed (constipation).   03/08/2023   metoprolol tartrate (LOPRESSOR) 50 MG tablet Take 1 tablet (50 mg total) by mouth 2 (two) times daily. 180 tablet 0 03/04/2023   pantoprazole (PROTONIX) 40 MG tablet Take 1 tablet (40 mg total) by mouth daily before breakfast. 30 tablet 6 03/04/2023   polyethylene glycol (MIRALAX / GLYCOLAX) 17 g packet Take 17 g by mouth daily as needed. (Patient taking differently: Take 17 g by mouth daily as needed for moderate constipation or mild constipation.) 14 each 0 Taking Differently   potassium chloride (MICRO-K) 10 MEQ CR capsule Take 2 capsules (20 mEq total) by mouth 2 (two) times daily.  (Patient taking differently: Take 30 mEq by mouth 2 (two) times daily.) 120 capsule 2 03/04/2023   prochlorperazine (COMPAZINE) 10 MG tablet Take 1 tablet (10 mg total) by mouth every 6 (six) hours as needed for nausea or vomiting. 30 tablet 2 Past Month   doxycycline (ADOXA) 100 MG tablet Take 100 mg by mouth 2 (two) times daily. (Patient not taking: Reported on 03/09/2023)   Not Taking   magic mouthwash (nystatin, diphenhydrAMINE, alum & mag hydroxide) suspension mixture Swish and Spit 5 mLs by mouth 4 (four) times daily as needed. (Patient not taking: Reported on 02/17/2023) 240 mL 1 Not Taking   morphine (MS CONTIN) 30 MG 12 hr tablet Take 30 mg by mouth every 12 (twelve) hours. (Patient not taking: Reported on 03/09/2023)   Not Taking   ondansetron (ZOFRAN) 8 MG tablet Take 1 tablet (8 mg total) by mouth every 8 (eight) hours as needed for nausea or vomiting. Can start taking 72 hours after chemotherapy (Patient not taking: Reported on 03/09/2023) 30 tablet 1 Not Taking    Assessment: 48 yo F with stage IV colon cancer. Pharmacy consulted to dose heparin for developing Portal Vein Peripheral Branch Thrombus.  Hg 12.1> 9.8, PLT WNL, SCr WNL  03/11/23 Heparin level 0.12 - subtherapeutic on heparin infusion at 1200 units/hr Hgb 8.9, PLTC wnl No complications of therapy and no interruption of therapy/line issues reported by RN  Goal of Therapy:  Heparin level 0.3-0.7 units/ml Monitor platelets by anticoagulation protocol: Yes   Plan:  -Heparin 2000 unit IV bolus x 1 -Increase heparin infusion to 1400 units/hr -Check 6 hour heparin level -Daily heparin level & CBC  Jaiveer Panas, Joselyn Glassman, PharmD Clinical Pharmacist 03/11/2023 4:53 AM

## 2023-03-11 NOTE — Progress Notes (Signed)
 Heparin gtt paused at 1222 for IR perc drain placement today.

## 2023-03-11 NOTE — Progress Notes (Addendum)
 PHARMACY - ANTICOAGULATION CONSULT NOTE  Pharmacy Consult for heparin Indication: portal vein thrombus  Allergies  Allergen Reactions   Lisinopril Swelling and Other (See Comments)    Lip swelling and Angioedema   Irinotecan Other (See Comments)    Stomach cramping Patient given 0.5 mL atropine IV and was able to continue infusion.  See Progress note on 10/03/20.    Leucovorin Calcium Itching    Patient reported itchy palms towards end of transfusion, see progress note 08/04/22.    Oxaliplatin Itching    Patient reported itchy palms towards end of transfusion, see progress note on 08/04/22.    Patient Measurements: Height: 5\' 4"  (162.6 cm) Weight: 62.4 kg (137 lb 9.1 oz) IBW/kg (Calculated) : 54.7 Heparin Dosing Weight: 62.4 kg  Vital Signs: Temp: 97.8 F (36.6 C) (02/26 1159) Temp Source: Oral (02/26 1159) BP: 144/102 (02/26 1159) Pulse Rate: 82 (02/26 1159)  Labs: Recent Labs    03/09/23 0944 03/10/23 0348 03/10/23 2021 03/11/23 0356 03/11/23 1105  HGB 12.1 9.8*  --  8.9*  --   HCT 37.7 30.3*  --  28.2*  --   PLT 387 292  --  250  --   APTT 33  --   --   --   --   LABPROT 13.9  --   --   --   --   INR 1.1  --   --   --   --   HEPARINUNFRC  --   --  0.10* 0.12* 0.22*  CREATININE 0.43* 0.48  --  0.30*  --     Estimated Creatinine Clearance: 73.5 mL/min (A) (by C-G formula based on SCr of 0.3 mg/dL (L)).   Assessment: 48 yo F with stage IV colon cancer. Pharmacy consulted to dose heparin for developing Portal Vein Peripheral Branch Thrombus.  Hg 12.1> 9.8, PLT WNL, SCr WNL  03/11/23 Heparin level 0.22 - subtherapeutic 6 hours after heparin 2000 unit bolus & heparin infusion increased to 1400 units/hr Hgb 8.9 - down trending, PLTC wnl No bleeding reported Heparin drip stopped at 12:22 by  IR MD for percutaneous drain placement today  Goal of Therapy:  Heparin level 0.3-0.7 units/ml Monitor platelets by anticoagulation protocol: Yes   Plan:  Heparin off  per IR for drain placement Per Dr Otis Dials: resume heparin 6 hours after procedure Resume heparin at increased rate of 1550 6 hours after procedure - tonight at 8 pm and check heparin level in 6 hours Daily CBC & heparin level Consider changing to enoxaparin since we have been unable to get her heparin level therapeutic  Herby Abraham, Pharm.D Use secure chat for questions 03/11/2023 1:04 PM

## 2023-03-11 NOTE — Consult Note (Cosign Needed Addendum)
 Consultation Note Date: 03/11/2023   Patient Name: April [name]  DOB: [birth year]  MRN: [id number]  Age / Sex: 48 y.o., female  PCP: Early, Sung Amabile, NP Referring Physician: [place], Eric J, DO  Reason for Consultation: Cancer related pain management   HPI/Patient Profile: 48 y.o. female  with past medical history significant for metastatic renal carcinoma, essential HTN, renal calculi, AAA, biliary dilatation and prolonged QT interval s/p ERCP with stent placement 02/26/2023, followed by Dr. Truett Perna with last chemotherapy 02/17/2023.  Patient presented to ED 03/09/2023 complaining of severe right upper quadrant/epigastric pain with associated nausea and vomiting.  ED workup found patient to be hypokalemic with leukocytosis and fever meeting SIRS criteria.  She was referred to general surgery for management of acute cholecystitis but found not to be a candidate for cholecystectomy due to degree of tumor burden in the area.  She was referred to IR for cholecystostomy drain for which she is scheduled later today. [name] has been referred to palliative care for symptom management.  Primary Decision Maker The patient is primary decision-maker at this time.  She verbalizes wanting her mother to be her HPOA in the event she is unable to make decisions for herself.  Spiritual consult placed to complete HPOA paperwork.  Discussion: Chart reviewed including labs, progress notes, imaging from this and previous encounters.   [name] complains of severe intermittent right upper quadrant pain and nausea made worse with talking, repositioning and walking. She reports relief with IV Dilaudid but does require every 2 hour dosing. She adds that she is going to have gallbladder drain placed today.   [name] resides at home with her 3 children (5 year old twins, 91 year old).  Prior to this admission  patient reports being completely functional at home performing all of her ADLs independently. She endorses loss of appetite for a few days after her treatments but tries to eat at least 1 meal per day once she starts feeling better. She acknowledges that her intake of food is most likely not adequate.    [name] endorses relief of pain with home pain medication regimen of MS Contin 30 mg twice daily and 4 mg Dilaudid (recently increased from 2 mg) 3 times daily until this acute RUQ pain event in which pain was not able to be managed.    Discussion was had with patient in the event that she is unable to make decisions for herself, she would need to appoint someone she trusts to make these decisions. Patient verbalized wanting her mother to make decisions for her in the event she is not able.  Spiritual consult placed to complete HPOA documentation.    Discussed code status as well. [name] understands that she is a full code now meaning CPR and artifical life support. She states that she has considered DNR, but is not ready to make that decision at this time. She would like time to discuss these topics with her family.    SUMMARY OF RECOMMENDATIONS   Restart home MS Contin  BID for long-acting pain control 2 mg IV Dilaudid q 2 hr PRN for severe breakthrough pain Continue home bowel regimen Caution use of QT prolonging medications   EKG on 03/10/23 showed Qtc 456. Would still recommend caution since elevated Qtc noted on admission. Adding Ativan IV 0.5mg  q 6hrs prn nausea.  Reassess pain control post cholecystostomy drain placement 2/27 Palliative to continue to follow for symptom management   Code Status/Advance Care Planning:   Code Status: Full Code    Prognosis:   Unable to determine  Discharge Planning: Home  Primary Diagnoses: Present on Admission:  Hypokalemia  SIRS (systemic inflammatory response syndrome) (HCC)  Abdominal pain, chronic, right upper quadrant   Metastatic colon cancer to liver (HCC)  Nausea and vomiting  Hypertension  Portal vein thrombosis  Prolonged QT interval  Fever   Review of Systems  Constitutional:  Positive for appetite change, fatigue and fever.  Gastrointestinal:  Positive for abdominal pain and nausea.    Physical Exam Vitals reviewed.  Constitutional:      General: She is not in acute distress.    Appearance: She is ill-appearing.  Pulmonary:     Effort: Pulmonary effort is normal. No respiratory distress.  Abdominal:     Tenderness: There is abdominal tenderness. There is guarding.  Skin:    General: Skin is warm and dry.  Neurological:     Mental Status: She is alert and oriented to person, place, and time.  Psychiatric:        Mood and Affect: Mood normal.        Behavior: Behavior normal.        Thought Content: Thought content normal.        Judgment: Judgment normal.     Vital Signs: BP (!) 148/100   Pulse 95   Temp 97.8 F (36.6 C) (Oral)   Resp (!) 22   Ht 5\' 4"  (1.626 m)   Wt 62.4 kg   LMP 01/07/2006   SpO2 99%   BMI 23.61 kg/m  Pain Scale: Faces   Pain Score: 7    SpO2: SpO2: 99 % O2 Device:SpO2: 99 % O2 Flow Rate: .O2 Flow Rate (L/min): 2 L/min  IO: Intake/output summary:  Intake/Output Summary (Last 24 hours) at 03/11/2023 1547 Last data filed at 03/11/2023 1510 Gross per 24 hour  Intake 1899.51 ml  Output 140 ml  Net 1759.51 ml    LBM: Last BM Date : 03/11/23 Baseline Weight: Weight: 62.4 kg Most recent weight: Weight: 62.4 kg      Palliative Assessment/Data:  PPS Level: 70%  Thank you for this consult. Palliative medicine will continue to follow and assist as needed.   Alex Gardener, Juel Burrow- [facility] Palliative Medicine Team  03/11/2023 3:59 PM  Office [phone]  Pager [phone]   Time Total: 90 minutes   Time includes:   Preparing to see the patient (e.g., review of tests) Obtaining and/or reviewing separately obtained history Performing a medically  necessary appropriate examination and/or evaluation Counseling and educating the patient/family/caregiver Ordering medications, tests, or procedures Referring and communicating with other health care professionals (when not reported separately) Documenting clinical information in the electronic or other health record Independently interpreting results (not reported separately) and communicating results to the patient/family/caregiver Care coordination (not reported separately) Clinical documentation   Please contact Palliative Medicine Team phone at [phone] for questions and concerns.  For individual provider: See Loretha Stapler

## 2023-03-12 DIAGNOSIS — R112 Nausea with vomiting, unspecified: Secondary | ICD-10-CM

## 2023-03-12 DIAGNOSIS — E876 Hypokalemia: Secondary | ICD-10-CM | POA: Diagnosis not present

## 2023-03-12 DIAGNOSIS — C189 Malignant neoplasm of colon, unspecified: Secondary | ICD-10-CM

## 2023-03-12 DIAGNOSIS — C787 Secondary malignant neoplasm of liver and intrahepatic bile duct: Secondary | ICD-10-CM

## 2023-03-12 LAB — COMPREHENSIVE METABOLIC PANEL
ALT: 19 U/L (ref 0–44)
AST: 15 U/L (ref 15–41)
Albumin: 2.4 g/dL — ABNORMAL LOW (ref 3.5–5.0)
Alkaline Phosphatase: 161 U/L — ABNORMAL HIGH (ref 38–126)
Anion gap: 8 (ref 5–15)
BUN: 6 mg/dL (ref 6–20)
CO2: 27 mmol/L (ref 22–32)
Calcium: 7.8 mg/dL — ABNORMAL LOW (ref 8.9–10.3)
Chloride: 98 mmol/L (ref 98–111)
Creatinine, Ser: 0.43 mg/dL — ABNORMAL LOW (ref 0.44–1.00)
GFR, Estimated: >60 mL/min (ref >60)
Glucose, Bld: 107 mg/dL — ABNORMAL HIGH (ref 70–99)
Potassium: 3.3 mmol/L — ABNORMAL LOW (ref 3.5–5.1)
Sodium: 133 mmol/L — ABNORMAL LOW (ref 135–145)
Total Bilirubin: 0.4 mg/dL (ref 0.0–1.2)
Total Protein: 6.1 g/dL — ABNORMAL LOW (ref 6.5–8.1)

## 2023-03-12 LAB — CBC WITH DIFFERENTIAL/PLATELET
Abs Immature Granulocytes: 0 10*3/uL (ref 0.00–0.07)
Basophils Absolute: 0.3 10*3/uL — ABNORMAL HIGH (ref 0.0–0.1)
Basophils Relative: 2 %
Eosinophils Absolute: 0.1 10*3/uL (ref 0.0–0.5)
Eosinophils Relative: 1 %
HCT: 26 % — ABNORMAL LOW (ref 36.0–46.0)
Hemoglobin: 8.5 g/dL — ABNORMAL LOW (ref 12.0–15.0)
Lymphocytes Relative: 31 %
Lymphs Abs: 3.9 10*3/uL (ref 0.7–4.0)
MCH: 30.9 pg (ref 26.0–34.0)
MCHC: 32.7 g/dL (ref 30.0–36.0)
MCV: 94.5 fL (ref 80.0–100.0)
Monocytes Absolute: 0.3 10*3/uL (ref 0.1–1.0)
Monocytes Relative: 2 %
Neutro Abs: 8.1 10*3/uL — ABNORMAL HIGH (ref 1.7–7.7)
Neutrophils Relative %: 64 %
Platelets: 296 10*3/uL (ref 150–400)
RBC: 2.75 MIL/uL — ABNORMAL LOW (ref 3.87–5.11)
RDW: 16.4 % — ABNORMAL HIGH (ref 11.5–15.5)
WBC: 12.6 10*3/uL — ABNORMAL HIGH (ref 4.0–10.5)
nRBC: 0 % (ref 0.0–0.2)

## 2023-03-12 LAB — HEPARIN LEVEL (UNFRACTIONATED): Heparin Unfractionated: 0.2 [IU]/mL — ABNORMAL LOW (ref 0.30–0.70)

## 2023-03-12 LAB — MAGNESIUM: Magnesium: 1.8 mg/dL (ref 1.7–2.4)

## 2023-03-12 LAB — PHOSPHORUS: Phosphorus: 2.4 mg/dL — ABNORMAL LOW (ref 2.5–4.6)

## 2023-03-12 MED ORDER — MAGNESIUM SULFATE 2 GM/50ML IV SOLN
2.0000 g | Freq: Once | INTRAVENOUS | Status: AC
  Administered 2023-03-12: 2 g via INTRAVENOUS
  Filled 2023-03-12: qty 50

## 2023-03-12 MED ORDER — ENOXAPARIN SODIUM 60 MG/0.6ML IJ SOSY
60.0000 mg | PREFILLED_SYRINGE | Freq: Two times a day (BID) | INTRAMUSCULAR | Status: DC
  Administered 2023-03-12 (×2): 60 mg via SUBCUTANEOUS
  Filled 2023-03-12 (×3): qty 0.6

## 2023-03-12 MED ORDER — PROCHLORPERAZINE EDISYLATE 10 MG/2ML IJ SOLN
5.0000 mg | INTRAMUSCULAR | Status: DC | PRN
  Administered 2023-03-12 – 2023-03-14 (×3): 10 mg via INTRAVENOUS
  Filled 2023-03-12 (×3): qty 2

## 2023-03-12 MED ORDER — POTASSIUM CHLORIDE CRYS ER 20 MEQ PO TBCR
40.0000 meq | EXTENDED_RELEASE_TABLET | ORAL | Status: AC
  Administered 2023-03-12 (×2): 40 meq via ORAL
  Filled 2023-03-12: qty 4
  Filled 2023-03-12: qty 2

## 2023-03-12 MED ORDER — SODIUM PHOSPHATES 45 MMOLE/15ML IV SOLN
15.0000 mmol | Freq: Once | INTRAVENOUS | Status: AC
  Administered 2023-03-12: 15 mmol via INTRAVENOUS
  Filled 2023-03-12: qty 5

## 2023-03-12 MED ORDER — HYDROMORPHONE HCL 2 MG PO TABS
2.0000 mg | ORAL_TABLET | ORAL | Status: DC | PRN
  Administered 2023-03-12 – 2023-03-14 (×5): 2 mg via ORAL
  Filled 2023-03-12 (×5): qty 1

## 2023-03-12 MED ORDER — METOPROLOL TARTRATE 25 MG PO TABS
25.0000 mg | ORAL_TABLET | Freq: Two times a day (BID) | ORAL | Status: DC
  Administered 2023-03-12 – 2023-03-13 (×2): 25 mg via ORAL
  Filled 2023-03-12 (×2): qty 1

## 2023-03-12 NOTE — Progress Notes (Signed)
 PROGRESS NOTE    April [name]  ZOX:[id number] DOB: [birth year] DOA: 03/09/2023 PCP: Tollie Eth, NP    Brief Narrative:   April [name] is a 48 y.o. 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 Meridee Score 02/26/2023), portal vein thrombus,, anemia of chronic medical disease, essential hypertension who presented to [facility] ED on 03/09/2023 from home via EMS with right right-sided abdominal pain associated with nausea and vomiting.  In the ED, temperature 101.3 F, HR 125, RR 22, BP 134/102.  SpO2 97% on room air.  WBC 16.5, hemoglobin 12.1, platelet count 387.  Sodium 137, potassium 2.7, chloride 96, CO2 28, glucose 110, BUN 6, creatinine 0.43.  INR 1.1.  Influenza A/B/RSV/COVID PCR negative.  Urinalysis with moderate hemoglobin otherwise unremarkable.  Chest x-ray with no acute cardiopulmonary disease process.  CT abdomen/pelvis with contrast with noted rectal carcinoma with peritoneal carcinomatosis, nodular thickening in the liver hilum slightly increased from previous, indwelling biliary stents with some improving biliary ductal dilation, heterogeneous appearance of the liver with more periportal edema and areas of potential developing portal vein peripheral branch thrombus, atrophy of the right kidney, bilateral nonobstructing renal stones.  Blood cultures x 2 obtained.  Patient received hydromorphone 1 mg IVP x 2, LR 1000 mL bolus, ondansetron 4 mg IVP, KCl 10 mEq IVPB and Zosyn 3.375 g IVPB.  Received additional normal saline 1000 mL liter bolus, prochlorperazine 10 mg IVP, ketorolac 30 mg IVP and hydromorphone 2 mg IVP x 1.  Hydromorphone 2 mg IVP x 1.  The case was discussed with primary oncologist by Dr. Myrle Sheng;  no anticoagulation recommended.  TRH consulted for admission.  Assessment & Plan:    Cholecystitis Patient presenting to ED with progressive right-sided abdominal pain.  Patient noted to be febrile with WBC count elevated at 16.5.  Right upper quadrant ultrasound with no biliary dilation but common duct partially obscured, dilated gallbladder with sludge and questionable stone, significant wall thickening.  General surgery was consulted and recommended IR percutaneous drain placement given she is a poor candidate for surgical management given her metastatic cancer. -- General Surgery/IR following, appreciate assistance -- WBC 16.5>27.2>26.4>12.6 -- Underwent cholecystostomy tube placement on 03/11/2023 -- Blood cultures x 2: No growth x 3 days -- Operative culture from cholecystostomy: Rare gram-negative rods on Gram stain, further pending -- Cefepime 2 g IV every 8 hours -- Metronidazole 500 mg IV every 12 hours -- MS Contin 30 mg PO q12h -- Dilaudid 1-2 mg IV every 2 hours as needed severe pain -- Lorazepam 0.5 mg IV every 6 hours as needed anxiety/nausea -- Flush tube with 5 mL NS every 8 hours, monitor drain output; 145 mL past 24 hours -- Outpatient follow-up with IR 6 weeks for routine tube evaluation -- CBC daily  Hypokalemia Potassium 3.3 this morning, will replete.    Hypomagnesemia Magnesium 1.8, will replete. -- Repeat electrolytes in a.m.  Hypophosphatemia Phosphate 2.4, will replete. -- Repeat electrolytes in a.m.  Stage IV sigmoid colon cancer with peritoneal carcinomatosis, liver metastasis Follow with medical oncology outpatient, Dr. Truett Perna.  Currently on salvage therapy with irinotecan/panitumumab  -- Medical oncology, palliative care following; appreciate assistance  Anemia of chronic medical disease -- Hemoglobin 8.9>8.5, stable. --Transfuse hemoglobin less than 7.0  Essential Hypertension On metoprolol tartrate 50 mg p.o. twice daily at baseline.  -- Restart metoprolol tartrate at reduced dose  25 minutes p.o. twice daily -- Mild BP  closely   DVT prophylaxis: Heparin drip    Code Status: Full Code Family Communication: No family present at bedside  Disposition Plan:  Level of care: Progressive Status is: Inpatient Remains inpatient appropriate because: IV antibiotics, awaiting culture results    Consultants:  Medical oncology General Surgery Interventional radiology Palliative care  Procedures:  Cholecystostomy, IR 2/26  Antimicrobials:  Zosyn 2/24 - 2/24 Cefepime 2/24>> Metronidazole 2/24>>   Subjective: Patient seen examined bedside, lying in bed.  Pain much improved following cholecystostomy tube placement yesterday.  Continues with slight pressure, mild nausea.  Tolerating diet.  Discussed with general surgery PA this morning. Patient with no other specific questions, concerns or complaints at this time.  Denies headache, no dizziness, no chest pain, no palpitations, no shortness of breath, no chills/night sweats, no current nausea/vomiting, no diarrhea, no focal weakness, no fatigue, no paresthesia.  No acute events overnight per nurse staff.  Objective: Vitals:   03/11/23 1505 03/11/23 1550 03/12/23 0603 03/12/23 1401  BP: (!) 148/100 (!) 139/100 (!) 137/99 (!) 146/107  Pulse: 95 93 92 88  Resp: (!) 22  18 20   Temp:  98 F (36.7 C) 97.9 F (36.6 C) 98.3 F (36.8 C)  TempSrc:   Oral Oral  SpO2: 99% 95% 98% 98%  Weight:      Height:        Intake/Output Summary (Last 24 hours) at 03/12/2023 1505 Last data filed at 03/12/2023 8295 Gross per 24 hour  Intake 1053.83 ml  Output 545 ml  Net 508.83 ml   Filed Weights   03/09/23 2042  Weight: 62.4 kg    Examination:  Physical Exam: GEN: NAD, alert and oriented x 3, chronically ill in appearance HEENT: NCAT, PERRL, EOMI, sclera clear, dry mucous membranes PULM: CTAB w/o wheezes/crackles, normal respiratory effort, on room air CV: RRR w/o M/G/R GI: abd soft, nondistended, slight TTP RUQ, right midline, + BS, cholecystostomy drain  noted MSK: no peripheral edema, moves all extremities independently NEURO: No focal neurological deficits PSYCH: normal mood/affect Integumentary: No concerning rashes/lesions/wounds noted on exposed skin surfaces    Data Reviewed: I have personally reviewed following labs and imaging studies  CBC: Recent Labs  Lab 03/09/23 0943 03/09/23 0944 03/10/23 0348 03/11/23 0356 03/12/23 0147  WBC  --  16.5* 27.2* 26.4* 12.6*  NEUTROABS  --  12.9* 23.1* 21.7* 8.1*  HGB 13.3 12.1 9.8* 8.9* 8.5*  HCT 39.0 37.7 30.3* 28.2* 26.0*  MCV  --  94.3 95.3 97.6 94.5  PLT  --  387 292 250 296   Basic Metabolic Panel: Recent Labs  Lab 03/09/23 0943 03/09/23 0944 03/10/23 0348 03/11/23 0356 03/12/23 0147  NA 136 137 136 134* 133*  K 2.8* 2.7* 3.6 3.1* 3.3*  CL 94* 96* 102 100 98  CO2  --  28 28 27 27   GLUCOSE 111* 110* 94 104* 107*  BUN 4* 6 9 7 6   CREATININE 0.50 0.43* 0.48 0.30* 0.43*  CALCIUM  --  9.4 8.5* 8.2* 7.8*  MG  --  1.7  --  1.6* 1.8  PHOS  --  4.1  --  2.3* 2.4*   GFR: Estimated Creatinine Clearance: 73.5 mL/min (A) (by C-G formula based on SCr of 0.43 mg/dL (L)). Liver Function Tests: Recent Labs  Lab 03/09/23 0944 03/10/23 0348 03/11/23 0356 03/12/23 0147  AST 25 29 19 15   ALT 32 29 23 19   ALKPHOS 216* 191* 177* 161*  BILITOT 0.6 1.1 0.5 0.4  PROT 8.0 6.7 6.3* 6.1*  ALBUMIN 3.6 2.8* 2.6* 2.4*   No results for input(s): "LIPASE", "AMYLASE" in the last 168 hours. No results for input(s): "AMMONIA" in the last 168 hours. Coagulation Profile: Recent Labs  Lab 03/09/23 0944  INR 1.1   Cardiac Enzymes: No results for input(s): "CKTOTAL", "CKMB", "CKMBINDEX", "TROPONINI" in the last 168 hours. BNP (last 3 results) No results for input(s): "PROBNP" in the last 8760 hours. HbA1C: No results for input(s): "HGBA1C" in the last 72 hours. CBG: No results for input(s): "GLUCAP" in the last 168 hours. Lipid Profile: No results for input(s): "CHOL", "HDL",  "LDLCALC", "TRIG", "CHOLHDL", "LDLDIRECT" in the last 72 hours. Thyroid Function Tests: No results for input(s): "TSH", "T4TOTAL", "FREET4", "T3FREE", "THYROIDAB" in the last 72 hours. Anemia Panel: No results for input(s): "VITAMINB12", "FOLATE", "FERRITIN", "TIBC", "IRON", "RETICCTPCT" in the last 72 hours. Sepsis Labs: Recent Labs  Lab 03/09/23 0943  LATICACIDVEN 1.0    Recent Results (from the past 240 hours)  Blood Culture (routine x 2)     Status: None (Preliminary result)   Collection Time: 03/09/23  9:43 AM   Specimen: BLOOD  Result Value Ref Range Status   Specimen Description   Final    BLOOD RIGHT ANTECUBITAL Performed at [facility], [address], [place], [place] [address]    Special Requests   Final    BOTTLES DRAWN AEROBIC AND ANAEROBIC Blood Culture adequate volume Performed at [facility], [address] [place], [place] [address]    Culture   Final    NO GROWTH 3 DAYS Performed at [facility], [address] [place], [place] [address]    Report Status PENDING  Incomplete  Resp panel by RT-PCR (RSV, Flu A&B, Covid) Anterior Nasal Swab     Status: None   Collection Time: 03/09/23  9:44 AM   Specimen: Anterior Nasal Swab  Result Value Ref Range Status   SARS Coronavirus 2 by RT PCR NEGATIVE NEGATIVE Final    Comment: (NOTE) SARS-CoV-2 target nucleic acids are NOT DETECTED.  The SARS-CoV-2 RNA is generally detectable in upper respiratory specimens during the acute phase of infection. The lowest concentration of SARS-CoV-2 viral copies this assay can detect is 138 copies/mL. A negative result does not preclude SARS-Cov-2 infection and should not be used as the sole basis for treatment or other patient management decisions. A negative result may occur with  improper specimen collection/handling, submission of specimen other than nasopharyngeal swab, presence of viral mutation(s) within the areas targeted by  this assay, and inadequate number of viral copies(<138 copies/mL). A negative result must be combined with clinical observations, patient history, and epidemiological information. The expected result is Negative.  Fact Sheet for Patients:  BloggerCourse.com  Fact Sheet for Healthcare Providers:  SeriousBroker.it  This test is no t yet approved or cleared by the [place] FDA and  has been authorized for detection and/or diagnosis of SARS-CoV-2 by FDA under an Emergency Use Authorization (EUA). This EUA will remain  in effect (meaning this test can be used) for the duration of the COVID-19 declaration under Section 564(b)(1) of the Act, 21 U.S.C.section 360bbb-3(b)(1), unless the authorization is terminated  or revoked sooner.       Influenza A by PCR NEGATIVE NEGATIVE Final   Influenza B by PCR NEGATIVE NEGATIVE Final    Comment: (NOTE) The Xpert Xpress SARS-CoV-2/FLU/RSV plus assay is intended as an aid in  the diagnosis of influenza from Nasopharyngeal swab specimens and should not be used as a sole basis for treatment. Nasal washings and aspirates are unacceptable for Xpert Xpress SARS-CoV-2/FLU/RSV testing.  Fact Sheet for Patients: BloggerCourse.com  Fact Sheet for Healthcare Providers: SeriousBroker.it  This test is not yet approved or cleared by the [place] FDA and has been authorized for detection and/or diagnosis of SARS-CoV-2 by FDA under an Emergency Use Authorization (EUA). This EUA will remain in effect (meaning this test can be used) for the duration of the COVID-19 declaration under Section 564(b)(1) of the Act, 21 U.S.C. section 360bbb-3(b)(1), unless the authorization is terminated or revoked.     Resp Syncytial Virus by PCR NEGATIVE NEGATIVE Final    Comment: (NOTE) Fact Sheet for Patients: BloggerCourse.com  Fact Sheet  for Healthcare Providers: SeriousBroker.it  This test is not yet approved or cleared by the [place] FDA and has been authorized for detection and/or diagnosis of SARS-CoV-2 by FDA under an Emergency Use Authorization (EUA). This EUA will remain in effect (meaning this test can be used) for the duration of the COVID-19 declaration under Section 564(b)(1) of the Act, 21 U.S.C. section 360bbb-3(b)(1), unless the authorization is terminated or revoked.  Performed at [facility], [address], [place], [place] [address]   Blood Culture (routine x 2)     Status: None (Preliminary result)   Collection Time: 03/09/23 11:48 AM   Specimen: BLOOD RIGHT HAND  Result Value Ref Range Status   Specimen Description   Final    BLOOD RIGHT HAND Performed at [facility], [address] [place], [place] [address]    Special Requests   Final    BOTTLES DRAWN AEROBIC AND ANAEROBIC Blood Culture adequate volume Performed at [facility], [address], [place], [place] [address]    Culture   Final    NO GROWTH 3 DAYS Performed at [facility], [address], [place], [place] [address]    Report Status PENDING  Incomplete  Aerobic/Anaerobic Culture w Gram Stain (surgical/deep wound)     Status: None (Preliminary result)   Collection Time: 03/11/23  2:04 PM   Specimen: BILE  Result Value Ref Range Status   Specimen Description   Final    BILE Performed at [facility], [address], [place], [place] [address]    Special Requests   Final    NONE Performed at [facility], [address] [place], [place] [address]    Gram Stain   Final    FEW WBC PRESENT,BOTH PMN AND MONONUCLEAR RARE GRAM NEGATIVE RODS    Culture   Final    NO GROWTH < 24 HOURS Performed at [facility], [address] [place], [place] [address]    Report Status PENDING  Incomplete          Radiology Studies: IR Perc Cholecystostomy Result Date: 03/11/2023 INDICATION: Acute cholecystitis EXAM: ULTRASOUND AND FLUOROSCOPIC-GUIDED CHOLECYSTOSTOMY TUBE PLACEMENT COMPARISON:  US Abdomen, 03/09/2023.  CT AP, 03/10/2023. MEDICATIONS: The patient is currently admitted to the hospital and on intravenous antibiotics. Antibiotics were administered within an appropriate time frame prior to skin puncture. 1 mg Dilaudid IV.  50 mg Benadryl IV.  25 mg Demerol IV ANESTHESIA/SEDATION: Moderate (conscious) sedation was employed during this procedure. A total of Versed 2 mg and Fentanyl 100 mcg was administered intravenously. Moderate Sedation Time: 16 minutes. The patient's level of consciousness and vital  signs were monitored continuously by radiology nursing throughout the procedure under my direct supervision. CONTRAST:  20mL OMNIPAQUE IOHEXOL 300 MG/ML SOLN - administered into the gallbladder fossa. FLUOROSCOPY TIME:  Fluoroscopic dose; 3 mGy COMPLICATIONS: None immediate. PROCEDURE: Informed written consent was obtained from the patient after a discussion of the risks, benefits and alternatives to treatment. Questions regarding the procedure were encouraged and answered. A timeout was performed prior to the initiation of the procedure. The right upper abdominal quadrant was prepped and draped in the usual sterile fashion, and a sterile drape was applied covering the operative field. Maximum barrier sterile technique with sterile gowns and gloves were used for the procedure. A timeout was performed prior to the initiation of the procedure. Local anesthesia was provided with 1% lidocaine with epinephrine. Ultrasound scanning of the right upper quadrant demonstrates a markedly dilated gallbladder. Of note, the patient reported pain with ultrasound imaging over the gallbladder. Utilizing a transhepatic approach, a 22 gauge needle was advanced into the gallbladder under direct ultrasound guidance. An  ultrasound image was saved for documentation purposes. Appropriate intraluminal puncture was confirmed with the efflux of bile and advancement of an 0.018 wire into the gallbladder lumen. The needle was exchanged for an Accustick set. A small amount of contrast was injected to confirm appropriate intraluminal positioning. Over a Benson wire, a 10 Fr cholecystomy tube was advanced into the gallbladder fossa, coiled and locked. Bile was aspirated and a small amount of contrast was injected as several post procedural spot radiographic images were obtained in various obliquities. The catheter was secured to the skin with suture, connected to a drainage bag and a dressing was placed. The patient tolerated the procedure well without immediate post procedural complication. IMPRESSION: Successful ultrasound and fluoroscopic guided placement of a 10 Fr cholecystostomy tube. 70 mL of serous bilious fluid was aspirated. A sample submitted for microbiological analysis. RECOMMENDATIONS: The patient will return to Vascular Interventional Radiology (VIR) for routine drainage catheter evaluation and exchange in 6-8 weeks. Roanna Banning, MD [facility] Radiology Electronically Signed   By: Roanna Banning M.D.   On: 03/11/2023 17:54   US Abdomen Limited RUQ (LIVER/GB) Result Date: 03/10/2023 CLINICAL DATA:  Right upper quadrant pain for 2 days. EXAM: ULTRASOUND ABDOMEN LIMITED RIGHT UPPER QUADRANT COMPARISON:  CT abdomen pelvis 03/09/2023 FINDINGS: Gallbladder: Dilated gallbladder with wall thickening measuring up to 7 mm. There is also sludge. Questionable stone. Common bile duct: Diameter: 2 mm.  Obscured by the known indwelling biliary stents. Liver: Homogeneous hepatic parenchyma. Tiny benign-appearing cyst in left hepatic lobe measuring 9 mm. This was seen previously. Portal vein is patent on color Doppler imaging with normal direction of blood flow towards the liver. Other: Trace  ascites. IMPRESSION: No biliary dilatation but the common duct is partially obscured by the known biliary stents. Dilated gallbladder with sludge and questionable stone. There is also significant wall thickening. Please correlate for any clinical evidence of acute cholecystitis although with the patient's history there is a differential. Further workup such as HIDA scan as clinically appropriate. Mild ascites. Please correlate with prior CT Electronically Signed   By: Karen Kays M.D.   On: 03/10/2023 19:41        Scheduled Meds:  acetaminophen  1,000 mg Oral Q8H   Chlorhexidine Gluconate Cloth  6 each Topical Daily   enoxaparin (LOVENOX) injection  60 mg Subcutaneous Q12H   morphine  30 mg Oral Q12H   sodium chloride flush  10-40 mL  Intracatheter Q12H   sodium chloride flush  5 mL Intracatheter Q8H   Continuous Infusions:  ceFEPime (MAXIPIME) IV 2 g (03/12/23 1233)   metronidazole 500 mg (03/12/23 1346)   sodium PHOSPHATE IVPB (in mmol) 15 mmol (03/12/23 0954)     LOS: 3 days    Time spent: 53 minutes spent on chart review, discussion with nursing staff, consultants, updating family and interview/physical exam; more than 50% of that time was spent in counseling and/or coordination of care.    Alvira Philips [place], DO [facility] Available via Epic secure chat 7am-7pm After these hours, please refer to coverage provider listed on [email or web address] 03/12/2023, 3:05 PM

## 2023-03-12 NOTE — Plan of Care (Signed)
  Problem: Fluid Volume: Goal: Hemodynamic stability will improve Outcome: Progressing   Problem: Elimination: Goal: Will not experience complications related to bowel motility Outcome: Not Progressing   Problem: Skin Integrity: Goal: Risk for impaired skin integrity will decrease Outcome: Not Progressing

## 2023-03-12 NOTE — Progress Notes (Signed)
 Daily Progress Note   Patient Name: April [name]       Date: 03/12/2023 DOB: [birth year]  Age: 48 y.o. MRN#: [id number] Attending Physician: [place], Eric J, DO Primary Care Physician: Tollie Eth, NP Admit Date: 03/09/2023  Reason for Consultation/Follow-up: Establishing goals of care and Pain control  Patient Profile/HPI: 48 y.o. female  with past medical history significant for metastatic renal carcinoma, essential HTN, renal calculi, AAA, biliary dilatation and prolonged QT interval s/p ERCP with stent placement 02/26/2023, followed by Dr. Truett Perna with last chemotherapy 02/17/2023.  Patient presented to ED 03/09/2023 complaining of severe right upper quadrant/epigastric pain with associated nausea and vomiting.  ED workup found patient to be hypokalemic with leukocytosis and fever meeting SIRS criteria.  She was referred to general surgery for management of acute cholecystitis but found not to be a candidate for cholecystectomy due to degree of tumor burden in the area.  She was referred to IR for cholecystostomy drain for which she is scheduled later today. [name] was referred to palliative care for symptom management.  Subjective: Pt states she feels much better today. She had some nausea after getting up and moving around this morning but had relief with medication. Medication is controlling her pain but advises she has to ask for dilaudid every two hours. Severe RUQ pain has resolved since having IR cholecystostomy drain placed yesterday with tenderness to insertion site. Was able to eat over half her breakfast this morning. She also asks about completing paperwork with chaplain.   Review of Systems  Constitutional:  Negative for fever.  Gastrointestinal:  Positive for  abdominal pain and nausea.     Physical Exam Vitals reviewed.  Constitutional:      General: She is not in acute distress.    Appearance: She is ill-appearing.  Pulmonary:     Effort: Pulmonary effort is normal. No respiratory distress.  Abdominal:     Tenderness: There is abdominal tenderness.     Comments: + RUQ adb drain- C/D/I with serosanguinous OP in gravity bag  Skin:    General: Skin is warm and dry.  Neurological:     Mental Status: She is alert and oriented to person, place, and time.  Psychiatric:        Mood and Affect: Mood normal.  Behavior: Behavior normal.        Thought Content: Thought content normal.        Judgment: Judgment normal.             Vital Signs: BP (!) 137/99 (BP Location: Right Arm)   Pulse 92   Temp 97.9 F (36.6 C) (Oral)   Resp 18   Ht 5\' 4"  (1.626 m)   Wt 62.4 kg   LMP 01/07/2006   SpO2 98%   BMI 23.61 kg/m  SpO2: SpO2: 98 % O2 Device: O2 Device: Room Air O2 Flow Rate: O2 Flow Rate (L/min): 2 L/min  Intake/output summary:  Intake/Output Summary (Last 24 hours) at 03/12/2023 1358 Last data filed at 03/12/2023 5284 Gross per 24 hour  Intake 1053.83 ml  Output 615 ml  Net 438.83 ml   LBM: Last BM Date : 03/12/23 Baseline Weight: Weight: 62.4 kg Most recent weight: Weight: 62.4 kg       Palliative Assessment/Data: 70%      Patient Active Problem List   Diagnosis Date Noted   SIRS (systemic inflammatory response syndrome) (HCC) 03/09/2023   Abdominal pain, chronic, right upper quadrant 03/09/2023   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   Fever 03/09/2023   Nausea and vomiting 02/26/2023   Upper abdominal pain 02/24/2023   Intractable vomiting 01/25/2023   Hyperproteinemia 01/24/2023   Elevated alkaline phosphatase level 01/24/2023   History of biliary stent insertion 12/31/2022   Intrahepatic bile duct dilation 12/31/2022   Imaging of gastrointestinal tract abnormal 12/31/2022   Hospital discharge follow-up  12/31/2022   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   Enterocolitis 12/26/2022   Bacteremia due to Klebsiella pneumoniae 12/26/2022   Hyponatremia 12/24/2022   Dehydration 12/16/2022   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   Ascending aortic aneurysm (HCC) 12/16/2022   Rectal polyp 11/23/2022   Diverticulosis of colon without hemorrhage 11/23/2022   Abnormal CT of the abdomen 11/21/2022   Cancer related pain 11/19/2022   Acute nonintractable headache 08/11/2022   History of ERCP 06/28/2022   Former smoker 04/30/2022   Intractable nausea and vomiting 04/16/2022   Protein-calorie malnutrition, severe 03/26/2022   Chronic constipation 03/26/2022   Metastatic colon cancer to liver (HCC) 03/24/2022   Obstructive jaundice 03/24/2022   Biliary stricture 03/24/2022   Biliary obstruction 03/23/2022   Hyperbilirubinemia 03/21/2022   Abnormal LFTs 03/21/2022   Nephrolithiasis 03/21/2022   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   Renal calculi    Hydronephrosis of right kidney    Colorectal cancer, stage IV (HCC) 10/15/2020   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   Port-A-Cath in place 11/22/2019   High risk medication use 06/07/2019   Adnexal mass 04/09/2019   Rectal cancer (HCC) 04/08/2019   Constipation 03/14/2019   Hypertension 01/07/2018   Sinus tachycardia 01/07/2018   Hypokalemia 01/07/2018   Prolonged QT interval 01/07/2018   Hyperglycemia 01/07/2018   Tobacco use 01/07/2018   Benign essential hypertension 10/06/2017    Palliative Care Assessment & Plan    Assessment  Cholecystitis- Underwent IR cholecystostomy drain placement 03/11/2023 WBC 26.4, 2/26 to 12.6, 2/27 post drain placement- pain much improved Hypokalemia Hypomagnesemia Hypophosphatemia 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 Would still recommend caution since elevated Qtc noted on admission.  Ativan IV 0.5mg  q 6hrs prn nausea.  Spiritual consult to complete HPOA documentation per patient request  Palliative to continue to follow for symptom management    Code Status:   Code Status: Full Code   Prognosis:  Unable to determine  Discharge Planning: Home  Care plan was discussed with Dr. [place] and primary RN.  Thank you for allowing the Palliative Medicine Team to assist in the care of this patient.   Alex Gardener, [facility] Palliative Medicine Team  03/12/2023 1:58 PM  Office [phone]  Pager [phone]    Total time: 50 minutes   Preparing to see the patient (e.g., review of tests) Obtaining and/or reviewing separately obtained history Performing a medically necessary appropriate examination and/or evaluation Counseling and educating the patient/family/caregiver Ordering medications, tests, or procedures Referring and communicating with other health care professionals (when not reported separately) Documenting clinical information in the electronic or other health record Independently interpreting results (not reported separately) and communicating results to the patient/family/caregiver Care coordination (not reported separately) Clinical documentation    Please contact Palliative Medicine Team phone at [phone] for questions and concerns.

## 2023-03-12 NOTE — Progress Notes (Signed)
 Referring Provider(s):  Thyra Breed  Supervising Physician: Gilmer Mor  Patient Status:  April [name] - In-pt  Chief Complaint:  Cholecystitis; stage IV rectal carcinoma - s/p image guided percutaneous cholecystostomy placement 03/11/23 Dr. Milford Cage   Subjective:  Pt states she is feeling nauseas this AM and that its likely from her chemo treatments  Pt states no pain at drain site   Allergies: Lisinopril, Irinotecan, Leucovorin calcium, and Oxaliplatin  Medications: Prior to Admission medications   Medication Sig Start Date End Date Taking? Authorizing Provider  amLODipine (NORVASC) 10 MG tablet Take 1 tablet (10 mg total) by mouth daily. 11/24/22  Yes Rodolph Bong, MD  dexamethasone (DECADRON) 4 MG tablet Take 1 tablet by mouth twice a day for 3 days beginning day 2 of chemo. 02/03/23  Yes Rana Snare, NP  dicyclomine (BENTYL) 20 MG tablet Take 1 tablet (20 mg total) by mouth 3 (three) times daily as needed for spasms (rectal/AB pain). Patient taking differently: Take 20 mg by mouth 2 (two) times daily as needed for spasms (rectal/AB pain). 02/17/23  Yes Ladene Artist, MD  HYDROmorphone (DILAUDID) 2 MG tablet Take 1 tablet (2 mg total) by mouth every 6 (six) hours as needed for severe pain (pain score 7-10). Patient taking differently: Take 4 mg by mouth 3 (three) times daily as needed for severe pain (pain score 7-10). 02/27/23  Yes Meredeth Ide, MD  lactulose (CHRONULAC) 10 GM/15ML solution Take 15-30 mLs by mouth daily as needed for mild constipation.   Yes [provider]  linaclotide (LINZESS) 72 MCG capsule Take 72 mcg by mouth daily as needed (constipation).   Yes [provider]  metoprolol tartrate (LOPRESSOR) 50 MG tablet Take 1 tablet (50 mg total) by mouth 2 (two) times daily. 11/24/22  Yes Rodolph Bong, MD  pantoprazole (PROTONIX) 40 MG tablet Take 1 tablet (40 mg total) by mouth daily before breakfast. 02/27/23  Yes Sharl Ma, [name] Ill, MD   polyethylene glycol (MIRALAX / GLYCOLAX) 17 g packet Take 17 g by mouth daily as needed. Patient taking differently: Take 17 g by mouth daily as needed for moderate constipation or mild constipation. 02/27/23  Yes Sharl Ma, [name] Ill, MD  potassium chloride (MICRO-K) 10 MEQ CR capsule Take 2 capsules (20 mEq total) by mouth 2 (two) times daily. Patient taking differently: Take 30 mEq by mouth 2 (two) times daily. 11/24/22  Yes Rodolph Bong, MD  prochlorperazine (COMPAZINE) 10 MG tablet Take 1 tablet (10 mg total) by mouth every 6 (six) hours as needed for nausea or vomiting. 02/03/23  Yes Rana Snare, NP  doxycycline (ADOXA) 100 MG tablet Take 100 mg by mouth 2 (two) times daily. Patient not taking: Reported on 03/09/2023    [provider]  magic mouthwash (nystatin, diphenhydrAMINE, alum & mag hydroxide) suspension mixture Swish and Spit 5 mLs by mouth 4 (four) times daily as needed. Patient not taking: Reported on 02/17/2023 02/03/23   Rana Snare, NP  morphine (MS CONTIN) 30 MG 12 hr tablet Take 30 mg by mouth every 12 (twelve) hours. Patient not taking: Reported on 03/09/2023    [provider]  ondansetron (ZOFRAN) 8 MG tablet Take 1 tablet (8 mg total) by mouth every 8 (eight) hours as needed for nausea or vomiting. Can start taking 72 hours after chemotherapy Patient not taking: Reported on 03/09/2023 02/03/23   Rana Snare, NP     Vital Signs: BP (!) 137/99 (BP Location:  Right Arm)   Pulse 92   Temp 97.9 F (36.6 C) (Oral)   Resp 18   Ht 5\' 4"  (1.626 m)   Wt 137 lb 9.1 oz (62.4 kg)   LMP 01/07/2006   SpO2 98%   BMI 23.61 kg/m   Physical Exam Constitutional:      Appearance: Normal appearance.  Skin:    Comments: Cholecystostomy tube F/A easily  Skin clean, dry, without signs of infection, suture in place   Output 50 mL red, clear   Neurological:     Mental Status: She is alert and oriented to person, place, and time.  Psychiatric:        Behavior:  Behavior normal.      Labs:  CBC: Recent Labs    03/09/23 0944 03/10/23 0348 03/11/23 0356 03/12/23 0147  WBC 16.5* 27.2* 26.4* 12.6*  HGB 12.1 9.8* 8.9* 8.5*  HCT 37.7 30.3* 28.2* 26.0*  PLT 387 292 250 296    COAGS: Recent Labs    12/24/22 0050 03/09/23 0944  INR 1.0 1.1  APTT 31 33    BMP: Recent Labs    03/09/23 0944 03/10/23 0348 03/11/23 0356 03/12/23 0147  NA 137 136 134* 133*  K 2.7* 3.6 3.1* 3.3*  CL 96* 102 100 98  CO2 28 28 27 27   GLUCOSE 110* 94 104* 107*  BUN 6 9 7 6   CALCIUM 9.4 8.5* 8.2* 7.8*  CREATININE 0.43* 0.48 0.30* 0.43*  GFRNONAA >60 >60 >60 >60    LIVER FUNCTION TESTS: Recent Labs    03/09/23 0944 03/10/23 0348 03/11/23 0356 03/12/23 0147  BILITOT 0.6 1.1 0.5 0.4  AST 25 29 19 15   ALT 32 29 23 19   ALKPHOS 216* 191* 177* 161*  PROT 8.0 6.7 6.3* 6.1*  ALBUMIN 3.6 2.8* 2.6* 2.4*    Assessment and Plan:  Pt with cholecystitis and stage IV rectal carcinoma s/p image guided percutaneous cholecystostomy placement 03/11/23 Dr. Milford Cage.    Drain Location: cholecystostomy tube  Size: Fr size: 10 Fr Date of placement: 03/11/23  Currently to: Drain collection device: gravity 24 hour output:  Output by Drain (mL) 03/10/23 0701 - 03/10/23 1900 03/10/23 1901 - 03/11/23 0700 03/11/23 0701 - 03/11/23 1900 03/11/23 1901 - 03/12/23 0700 03/12/23 0701 - 03/12/23 0930  Biliary Tube Cholecystostomy 10 Fr. RUQ   70 75     Interval imaging/drain manipulation:  None   Current examination: Flushes/aspirates easily.  Insertion site unremarkable. Suture and stat lock in place. Dressed appropriately.   Plan: Continue TID flushes with 5 cc NS. Record output Q shift. Dressing changes QD or PRN if soiled.  Call IR APP or on call IR MD if difficulty flushing or sudden change in drain output.  Repeat imaging/possible drain injection once output < 10 mL/QD (excluding flush material). Consideration for drain removal if output is < 10 mL/QD  (excluding flush material), pending discussion with the providing surgical service.  Discharge planning: Please contact IR APP or on call IR MD prior to patient d/c to ensure appropriate follow up plans are in place. Typically patient will follow up with IR clinic 10-14 days post d/c for repeat imaging/possible drain injection. IR scheduler will contact patient with date/time of appointment. Patient will need to flush drain QD with 5 cc NS, record output QD, dressing changes every 2-3 days or earlier if soiled.   IR will continue to follow - please call with questions or concerns.   Electronically Signed: Loman Brooklyn, PA-C  03/12/2023, 9:28 AM    I spent a total of 25 Minutes at the the patient's bedside AND on the patient's hospital floor or unit, greater than 50% of which was counseling/coordinating care for image guided percutaneous cholecystostomy placement.

## 2023-03-12 NOTE — Progress Notes (Signed)
 PHARMACY - ANTICOAGULATION CONSULT NOTE  Pharmacy Consult for heparin Indication: portal vein thrombus  Allergies  Allergen Reactions   Lisinopril Swelling and Other (See Comments)    Lip swelling and Angioedema   Irinotecan Other (See Comments)    Stomach cramping Patient given 0.5 mL atropine IV and was able to continue infusion.  See Progress note on 10/03/20.    Leucovorin Calcium Itching    Patient reported itchy palms towards end of transfusion, see progress note 08/04/22.    Oxaliplatin Itching    Patient reported itchy palms towards end of transfusion, see progress note on 08/04/22.    Patient Measurements: Height: 5\' 4"  (162.6 cm) Weight: 62.4 kg (137 lb 9.1 oz) IBW/kg (Calculated) : 54.7 Heparin Dosing Weight: 62.4 kg  Vital Signs: Temp: 98 F (36.7 C) (02/26 1550) BP: 139/100 (02/26 1550) Pulse Rate: 93 (02/26 1550)  Labs: Recent Labs    03/09/23 0944 03/10/23 0348 03/10/23 2021 03/11/23 0356 03/11/23 1105 03/12/23 0147  HGB 12.1 9.8*  --  8.9*  --  8.5*  HCT 37.7 30.3*  --  28.2*  --  26.0*  PLT 387 292  --  250  --  296  APTT 33  --   --   --   --   --   LABPROT 13.9  --   --   --   --   --   INR 1.1  --   --   --   --   --   HEPARINUNFRC  --   --    < > 0.12* 0.22* 0.20*  CREATININE 0.43* 0.48  --  0.30*  --  0.43*   < > = values in this interval not displayed.    Estimated Creatinine Clearance: 73.5 mL/min (A) (by C-G formula based on SCr of 0.43 mg/dL (L)).   Assessment: 48 yo F with stage IV colon cancer. Pharmacy consulted to dose heparin for developing Portal Vein Peripheral Branch Thrombus.  Hg 12.1> 9.8, PLT WNL, SCr WNL  2/27 percutaneous drain placement  03/12/23 Heparin level 0.2 - subtherapeutic following heparin restarting @ 1550 units/hr following being held for percutaneous drain placement yesterday Hgb 8.5 - PLTC wnl RN reports collection from drain is red; however this is unchanged as this was present yesterday   Goal of  Therapy:  Heparin level 0.3-0.7 units/ml Monitor platelets by anticoagulation protocol: Yes   Plan:  Increase heparin gtt to 1700 units/hr Check heparin level 6 hr after rate increased Daily CBC & heparin level Consider changing to enoxaparin since we have been unable to get her heparin level therapeutic  Terrilee Files, PharmD 03/12/2023 3:05 AM

## 2023-03-12 NOTE — Progress Notes (Addendum)
 April [name]   DOB:06-14-1975   JW#:[id number]      ASSESSMENT & PLAN:  1.  Severe abdominal pain Nausea/vomiting - Admitted via ED 03/09/2023 with complaints of severe abdominal pain - CT scan abdomen pelvis done 03/09/2023, nodular thickening in the liver hilum slightly increased from previous.  Indwelling biliary stents showed improving biliary ductal dilatation. - Admitted and discharged 02/27/2023 with same complaints.  ERCP with stent exchange done 02/26/2023. - Oral Dilaudid at home was prescribed as patient reported no relief from MS Contin or hydrocodone.  Continue pain management during this hospitalization. - Continue antiemetics as ordered - GI evaluation done, no intervention at this point. - Abdominal ultrasound done 03/10/2023 shows dilated gallbladder with sludge and questionable stone. - Seen by Surgery, pt not surgical candidate, recommended drainage,   S/p percutaneous cholecystostomy tube placed 03/11/23 which is seen draining serosanguineous fluid well - Will continue to monitor   2.  Sigmoid colon cancer, stage IV - Initially diagnosed 2021 - Status post FOLFIRI/bevacizumab and Udenyca given in 2021.  From 2022 to 2023 FOLFIRI with panitumumab was given.   -Maintenance Xeloda was given in 2022.  Lonsurf was given in 2023. Then in 2024 patient received FOLFOX therapy.   -Currently on salvage therapy with irinotecan/panitumumab.  Received cycle four 02/17/2023 and tolerated well.     -Seen in outpatient oncology 03/03/2023.  She had an outpatient appointment scheduled to 03/11/23, will be rescheduled upon discharge. - Medical oncology/Dr. Truett Perna following closely   3.  Anemia - Likely due to malignancy and recent chemotherapy - Hemoglobin 8.5 stable - Transfuse PRBC for Hgb <7.0.  No transfusional intervention indicated at this time - Continue to monitor CBC with differential   4.  Leukocytosis - WBC decreasing nicely, 12.6 today - Continue IV antibiotics -  Monitor fever curve   5.  Hypertension - Continue antihypertensives - Monitor BP levels      Code Status Full  Subjective:  Patient seen awake and alert laying in bed.  Reports that she is feeling a lot better since having drainage tube and does not have the sharp pain in her abdomen any longer.  Still has some abdominal soreness.  No other acute complaints and no acute distress is noted.  Objective:  Vitals:   03/11/23 1550 03/12/23 0603  BP: (!) 139/100 (!) 137/99  Pulse: 93 92  Resp:  18  Temp: 98 F (36.7 C) 97.9 F (36.6 C)  SpO2: 95% 98%     Intake/Output Summary (Last 24 hours) at 03/12/2023 0931 Last data filed at 03/12/2023 0600 Gross per 24 hour  Intake 933.83 ml  Output 615 ml  Net 318.83 ml     REVIEW OF SYSTEMS:   Constitutional: Denies fevers, chills or abnormal night sweats Eyes: Denies blurriness of vision, double vision or watery eyes Ears, nose, mouth, throat, and face: Denies mucositis or sore throat Respiratory: Denies cough, dyspnea or wheezes Cardiovascular: Denies palpitation, chest discomfort or lower extremity swelling Gastrointestinal: + Abdominal soreness Skin: Denies abnormal skin rashes Lymphatics: Denies new lymphadenopathy or easy bruising Neurological: Denies numbness, tingling or new weaknesses Behavioral/Psych: Mood is stable, no new changes  All other systems were reviewed with the patient and are negative.  PHYSICAL EXAMINATION: ECOG PERFORMANCE STATUS: 2 - Symptomatic, <50% confined to bed  Vitals:   03/11/23 1550 03/12/23 0603  BP: (!) 139/100 (!) 137/99  Pulse: 93 92  Resp:  18  Temp: 98 F (36.7 C) 97.9 F (36.6 C)  SpO2:  95% 98%   Filed Weights   03/09/23 2042  Weight: 137 lb 9.1 oz (62.4 kg)    GENERAL: alert, no distress and comfortable SKIN: skin color, texture, turgor are normal, no rashes or significant lesions EYES: normal, conjunctiva are pink and non-injected, sclera clear OROPHARYNX: no exudate, no  erythema and lips, buccal mucosa, and tongue normal  NECK: supple, thyroid normal size, non-tender, without nodularity LYMPH: no palpable lymphadenopathy in the cervical, axillary or inguinal LUNGS: clear to auscultation and percussion with normal breathing effort HEART: regular rate & rhythm and no murmurs and no lower extremity edema ABDOMEN: +Right-sided abdominal drain MUSCULOSKELETAL: no cyanosis of digits and no clubbing  PSYCH: alert & oriented x 3 with fluent speech NEURO: no focal motor/sensory deficits   All questions were answered. The patient knows to call the clinic with any problems, questions or concerns.   The total time spent in the appointment was 40 minutes encounter with patient including review of chart and various tests results, discussions about plan of care and coordination of care plan  Dawson Bills, NP 03/12/2023 9:31 AM    Labs Reviewed:  Lab Results  Component Value Date   WBC 12.6 (H) 03/12/2023   HGB 8.5 (L) 03/12/2023   HCT 26.0 (L) 03/12/2023   MCV 94.5 03/12/2023   PLT 296 03/12/2023   Recent Labs    03/27/22 1212 03/31/22 0945 11/21/22 0557 11/22/22 0710 12/16/22 0505 12/17/22 0500 03/10/23 0348 03/11/23 0356 03/12/23 0147  NA  --    < > 138   < > 132*   < > 136 134* 133*  K 3.8   < > 3.5   < > 2.9*   < > 3.6 3.1* 3.3*  CL  --    < > 101   < > 97*   < > 102 100 98  CO2  --    < > 24   < > 29   < > 28 27 27   GLUCOSE  --    < > 139*   < > 97   < > 94 104* 107*  BUN  --    < > 9   < > 8   < > 9 7 6   CREATININE  --    < > 0.58   < > 0.52   < > 0.48 0.30* 0.43*  CALCIUM  --    < > 10.3   < > 8.5*   < > 8.5* 8.2* 7.8*  GFRNONAA  --    < > >60   < > >60   < > >60 >60 >60  PROT 6.8   < > 8.9*   < > 7.1   < > 6.7 6.3* 6.1*  ALBUMIN 3.2*   < > 4.4  4.5   < > 3.2*   < > 2.8* 2.6* 2.4*  AST 78*   < > 35   < > 37   < > 29 19 15   ALT 172*   < > 24   < > 28   < > 29 23 19   ALKPHOS 506*   < > 172*   < > 204*   < > 191* 177* 161*  BILITOT 2.3*    < > 0.6   < > 0.5   < > 1.1 0.5 0.4  BILIDIR 1.1*  --  <0.1  --  0.1  --   --   --   --   IBILI 1.2*  --  NOT CALCULATED  --  0.4  --   --   --   --    < > = values in this interval not displayed.    Studies Reviewed:  IR Perc Cholecystostomy Result Date: 03/11/2023 INDICATION: Acute cholecystitis EXAM: ULTRASOUND AND FLUOROSCOPIC-GUIDED CHOLECYSTOSTOMY TUBE PLACEMENT COMPARISON:  US Abdomen, 03/09/2023.  CT AP, 03/10/2023. MEDICATIONS: The patient is currently admitted to the hospital and on intravenous antibiotics. Antibiotics were administered within an appropriate time frame prior to skin puncture. 1 mg Dilaudid IV.  50 mg Benadryl IV.  25 mg Demerol IV ANESTHESIA/SEDATION: Moderate (conscious) sedation was employed during this procedure. A total of Versed 2 mg and Fentanyl 100 mcg was administered intravenously. Moderate Sedation Time: 16 minutes. The patient's level of consciousness and vital signs were monitored continuously by radiology nursing throughout the procedure under my direct supervision. CONTRAST:  20mL OMNIPAQUE IOHEXOL 300 MG/ML SOLN - administered into the gallbladder fossa. FLUOROSCOPY TIME:  Fluoroscopic dose; 3 mGy COMPLICATIONS: None immediate. PROCEDURE: Informed written consent was obtained from the patient after a discussion of the risks, benefits and alternatives to treatment. Questions regarding the procedure were encouraged and answered. A timeout was performed prior to the initiation of the procedure. The right upper abdominal quadrant was prepped and draped in the usual sterile fashion, and a sterile drape was applied covering the operative field. Maximum barrier sterile technique with sterile gowns and gloves were used for the procedure. A timeout was performed prior to the initiation of the procedure. Local anesthesia was provided with 1% lidocaine with epinephrine. Ultrasound scanning of the right upper quadrant demonstrates a markedly dilated gallbladder. Of note, the  patient reported pain with ultrasound imaging over the gallbladder. Utilizing a transhepatic approach, a 22 gauge needle was advanced into the gallbladder under direct ultrasound guidance. An ultrasound image was saved for documentation purposes. Appropriate intraluminal puncture was confirmed with the efflux of bile and advancement of an 0.018 wire into the gallbladder lumen. The needle was exchanged for an Accustick set. A small amount of contrast was injected to confirm appropriate intraluminal positioning. Over a Benson wire, a 10 Fr cholecystomy tube was advanced into the gallbladder fossa, coiled and locked. Bile was aspirated and a small amount of contrast was injected as several post procedural spot radiographic images were obtained in various obliquities. The catheter was secured to the skin with suture, connected to a drainage bag and a dressing was placed. The patient tolerated the procedure well without immediate post procedural complication. IMPRESSION: Successful ultrasound and fluoroscopic guided placement of a 10 Fr cholecystostomy tube. 70 mL of serous bilious fluid was aspirated. A sample submitted for microbiological analysis. RECOMMENDATIONS: The patient will return to Vascular Interventional Radiology (VIR) for routine drainage catheter evaluation and exchange in 6-8 weeks. Roanna Banning, MD [facility] Electronically Signed   By: Roanna Banning M.D.   On: 03/11/2023 17:54   US Abdomen Limited RUQ (LIVER/GB) Result Date: 03/10/2023 CLINICAL DATA:  Right upper quadrant pain for 2 days. EXAM: ULTRASOUND ABDOMEN LIMITED RIGHT UPPER QUADRANT COMPARISON:  CT abdomen pelvis 03/09/2023 FINDINGS: Gallbladder: Dilated gallbladder with wall thickening measuring up to 7 mm. There is also sludge. Questionable stone. Common bile duct: Diameter: 2 mm.  Obscured by the known indwelling biliary stents. Liver: Homogeneous hepatic parenchyma. Tiny  benign-appearing cyst in left hepatic lobe measuring 9 mm. This was seen previously. Portal vein is patent on color Doppler imaging with normal direction of blood flow towards the  liver. Other: Trace ascites. IMPRESSION: No biliary dilatation but the common duct is partially obscured by the known biliary stents. Dilated gallbladder with sludge and questionable stone. There is also significant wall thickening. Please correlate for any clinical evidence of acute cholecystitis although with the patient's history there is a differential. Further workup such as HIDA scan as clinically appropriate. Mild ascites. Please correlate with prior CT Electronically Signed   By: Karen Kays M.D.   On: 03/10/2023 19:41   CT ABDOMEN PELVIS W CONTRAST Result Date: 03/09/2023 CLINICAL DATA:  Rectal cancer with liver metastases with abdominal pain. * Tracking Code: BO * EXAM: CT ABDOMEN AND PELVIS WITH CONTRAST TECHNIQUE: Multidetector CT imaging of the abdomen and pelvis was performed using the standard protocol following bolus administration of intravenous contrast. RADIATION DOSE REDUCTION: This exam was performed according to the departmental dose-optimization program which includes automated exposure control, adjustment of the mA and/or kV according to patient size and/or use of iterative reconstruction technique. CONTRAST:  OMNIPAQUE IOHEXOL 300 MG/ML  SOLN COMPARISON:  CT 02/24/2023. FINDINGS: Lower chest: There is some linear opacity lung bases likely scar or atelectasis. No pleural effusion. Hepatobiliary: There is areas of fatty liver infiltration particularly geographically along the right hepatic lobe more so than left. This is progressive from previous examination. Portal vein is patent grossly but somewhat narrowed by extrinsic compression. There are indwelling endoscopically placed stents. Level of intrahepatic biliary duct dilatation is slightly improving on the left and some on the right. The gallbladder  continues to be dilated with some wall edema which is increasing. There is more nodular tissue extending up along the porta hepatis as best seen on coronal series 8, image 49. This has a differential including progression of disease. There are some areas of progressive portal vein occlusion such is anteriorly in the right hepatic lobe inferiorly best seen on coronal imaging. Pancreas: No pancreatic mass. Persistent pancreas ductal dilatation, similar to previous. Spleen: Normal in size without focal abnormality. Adrenals/Urinary Tract: Adrenal glands are preserved. Bilateral nonobstructing renal stones. No enhancing mass or collecting system dilatation. Preserved contour to the urinary bladder. Areas of atrophy in the right kidney are again noted. Stomach/Bowel: On this non oral contrast exam large bowel has a normal course and caliber with scattered stool. There are loops of decompressed transverse colon. The small bowel is nondilated. Along the rectum once again is nodular wall thickening with extension into the adjacent tissue consistent with known history of neoplasm. Nodular areas are seen extending perirectal and presacral spaces. Vascular/Lymphatic: Normal caliber aorta and IVC. There is ectasia of the left common iliac artery with diameter approaching 19 mm. Scattered atherosclerotic calcified plaque. Several small less than 1 cm size in short axis retroperitoneal nodes are seen, not pathologic by size criteria. Reproductive: Uterus is present. Other: Peritoneal carcinomatosis again identified. Example measured previously anterior in the central upper abdomen with transverse dimension of 2.2 cm. Today this is measured 2.5 cm. Other areas are also similar including the pericolic gutters. There is also thickening along the surface of the liver towards the dome on the right side and laterally. Some these areas appear more prominent today than the recent prior this could be technical. Musculoskeletal: Curvature  of the spine with some degenerative changes. 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 IMPRESSION: Again note is made of rectal carcinoma with peritoneal carcinomatosis. Trace ascites. The level of nodular thickening in the liver hilum appears slightly increased from previous. Indwelling biliary stents with some  proving biliary ductal dilatation compared to the prior examination. However there is a more heterogeneous appearance of the liver with more periportal edema and areas of potential developing portal vein peripheral branch thrombus particularly along the inferior aspect of the liver. Bilateral nonobstructing renal stones.  Atrophy of the right kidney. Critical Value/emergent results were called by telephone at the time of interpretation on 03/09/2023 at 11:28 am to provider [facility] , who verbally acknowledged these results. Electronically Signed   By: Karen Kays M.D.   On: 03/09/2023 11:49   DG Chest Port 1 View Result Date: 03/09/2023 CLINICAL DATA:  48 year old female with possible sepsis. Metastatic rectal cancer. EXAM: PORTABLE CHEST 1 VIEW COMPARISON:  CT Chest, Abdomen, and Pelvis 01/24/2023 and earlier. FINDINGS: Portable AP semi upright view at 0949 hours. Lung volumes and mediastinal contours remain normal. Left side PICC line remains in place. Visualized tracheal air column is within normal limits. Allowing for portable technique the lungs are clear. No pneumothorax or pleural effusion. No evidence of pneumoperitoneum. Negative visible bowel gas. No acute osseous abnormality identified. IMPRESSION: No acute cardiopulmonary abnormality. Electronically Signed   By: Odessa Fleming M.D.   On: 03/09/2023 10:23   DG ERCP Result Date: 02/27/2023 CLINICAL DATA:  Encounter for removal of biliary stent. EXAM: ERCP TECHNIQUE: Multiple spot images obtained with the fluoroscopic device and submitted for interpretation post-procedure. FLUOROSCOPY: Radiation Exposure Index (as provided by the  fluoroscopic device): 106.44 mGy Kerma COMPARISON:  CT 02/24/2023 FINDINGS: Initial image demonstrates 2 plastic biliary stents. Biliary stents removed. Retrograde cholangiogram was performed. Cholangiogram images demonstrate narrowing and irregularity near the biliary confluence and involving the left and right hepatic ducts. Wires were advanced into the biliary ducts. Evidence for balloon dilatation of the biliary ducts. Final images demonstrate placement of 2 new plastic biliary stents presumably in the left and right hepatic ducts. IMPRESSION: 1. 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 These areas were dilated and new biliary stents were placed. These images were submitted for radiologic interpretation only. Please see the procedural report. Electronically Signed   By: Richarda Overlie M.D.   On: 02/27/2023 09:03   CT ABDOMEN PELVIS W CONTRAST Result Date: 02/24/2023 CLINICAL DATA:  Abdominal pain EXAM: CT ABDOMEN AND PELVIS WITH CONTRAST TECHNIQUE: Multidetector CT imaging of the abdomen and pelvis was performed using the standard protocol following bolus administration of intravenous contrast. RADIATION DOSE REDUCTION: This exam was performed according to the departmental dose-optimization program which includes automated exposure control, adjustment of the mA and/or kV according to patient size and/or use of iterative reconstruction technique. CONTRAST:  75mL OMNIPAQUE IOHEXOL 300 MG/ML  SOLN COMPARISON:  01/24/2023 FINDINGS: Lower chest: No acute abnormality. Hepatobiliary: Redemonstration of subtly hypoattenuating lesion within the peripheral aspect of hepatic segment 2 (series 2, image 20). Additional scattered subcentimeter low-density lesions within the liver remain too small to characterize. Prominent intrahepatic biliary dilatation with biliary stents in place. Slightly hyperattenuating material fills the gallbladder. No pericholecystic  inflammatory changes by CT. Pancreas: Stable mild pancreatic ductal dilatation. No inflammatory changes. Spleen: Normal in size without focal abnormality. Adrenals/Urinary Tract: Unremarkable adrenal glands. Stable bilateral nephrolithiasis with stones measuring up to 1.1 cm on the left and 0.9 cm on the right. No hydronephrosis. Urinary bladder within normal limits for the degree of distension. Stomach/Bowel: Stomach is within normal limits. Appendix appears normal. Similar degree of wall thickening of the rectosigmoid colon. No new sites of bowel wall thickening or inflammation.  Vascular/Lymphatic: Scattered aortoiliac atherosclerotic calcifications without aneurysm. No abdominopelvic lymphadenopathy. Reproductive: Uterus and bilateral adnexa are unremarkable. Other: Similar degree of nodularity along the omentum and left pericolic gutter. No ascites or pneumoperitoneum. Musculoskeletal: No lytic or sclerotic bone lesion. Grade 2 anterolisthesis of L5 on S1 secondary to chronic bilateral L5 pars interarticularis defects. IMPRESSION: 1. No new or acute abdominopelvic findings. 2. 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 3. Prominent intrahepatic biliary dilatation with biliary stents in place. 4. Stable bilateral nephrolithiasis. 5. Aortic atherosclerosis (ICD10-I70.0). Electronically Signed   By: Duanne Guess D.O.   On: 02/24/2023 18:49  Ms. Perritt was interviewed and examined.  She underwent cholecystostomy tube placement yesterday.  The pain has significantly improved.  She is afebrile and the white count is lower.  The clinical presentation appears to have been related to cholecystitis in the setting of metastatic colon cancer.  The surgical service does not recommend a cholecystectomy.  It is unclear whether the cholecystitis is related to progression of colon cancer.  She will continue antibiotics and cholecystostomy  drainage.  Outpatient follow-up will be scheduled Cancer [facility].  Recommendations: Continue antibiotics, follow-up cultures Management of cholecystostomy per interventional radiology Continue narcotic analgesics Please call oncology as needed, outpatient follow-up to be scheduled at the cancer center

## 2023-03-12 NOTE — Progress Notes (Signed)
 [facility] Progress Note     Subjective: CC:  Reports her abdominal pain has improved since perc chole placement. Nauseated this morning after getting up and moving around.  Objective: Vital signs in last 24 hours: Temp:  [97.8 F (36.6 C)-98 F (36.7 C)] 97.9 F (36.6 C) (02/27 0603) Pulse Rate:  [80-95] 92 (02/27 0603) Resp:  [17-22] 18 (02/27 0603) BP: (132-150)/(89-102) 137/99 (02/27 0603) SpO2:  [95 %-100 %] 98 % (02/27 0603) Last BM Date : 03/12/23  Intake/Output from previous day: 02/26 0701 - 02/27 0700 In: 933.8 [I.V.:187.1; IV Piggyback:746.7] Out: 615 [Urine:400; Drains:145] Intake/Output this shift: No intake/output data recorded.  PE: Gen:  Alert, appears nauseated, leaning over trashcan  Card:  Regular rate and rhythm Pulm:  Normal effort Abd: Soft, perc chole tube in place draining cloudy, SS fluid. Abdomen mildly tender Skin: warm and dry, no rashes  Psych: A&Ox3   Lab Results:  Recent Labs    03/11/23 0356 03/12/23 0147  WBC 26.4* 12.6*  HGB 8.9* 8.5*  HCT 28.2* 26.0*  PLT 250 296   BMET Recent Labs    03/11/23 0356 03/12/23 0147  NA 134* 133*  K 3.1* 3.3*  CL 100 98  CO2 27 27  GLUCOSE 104* 107*  BUN 7 6  CREATININE 0.30* 0.43*  CALCIUM 8.2* 7.8*   PT/INR No results for input(s): "LABPROT", "INR" in the last 72 hours. CMP     Component Value Date/Time   NA 133 (L) 03/12/2023 0147   K 3.3 (L) 03/12/2023 0147   CL 98 03/12/2023 0147   CO2 27 03/12/2023 0147   GLUCOSE 107 (H) 03/12/2023 0147   BUN 6 03/12/2023 0147   CREATININE 0.43 (L) 03/12/2023 0147   CREATININE 0.52 03/03/2023 0945   CALCIUM 7.8 (L) 03/12/2023 0147   PROT 6.1 (L) 03/12/2023 0147   ALBUMIN 2.4 (L) 03/12/2023 0147   AST 15 03/12/2023 0147   AST 14 (L) 03/03/2023 0945   ALT 19 03/12/2023 0147   ALT 11 03/03/2023 0945   ALKPHOS 161 (H) 03/12/2023 0147   BILITOT 0.4 03/12/2023 0147   BILITOT 0.2 03/03/2023 0945   GFRNONAA >60 03/12/2023 0147    GFRNONAA >60 03/03/2023 0945   GFRAA >60 10/17/2019 0955   Lipase     Component Value Date/Time   LIPASE 20 02/24/2023 1235       Studies/Results: IR Perc Cholecystostomy Result Date: 03/11/2023 INDICATION: Acute cholecystitis EXAM: ULTRASOUND AND FLUOROSCOPIC-GUIDED CHOLECYSTOSTOMY TUBE PLACEMENT COMPARISON:  US Abdomen, 03/09/2023.  CT AP, 03/10/2023. MEDICATIONS: The patient is currently admitted to the hospital and on intravenous antibiotics. Antibiotics were administered within an appropriate time frame prior to skin puncture. 1 mg Dilaudid IV.  50 mg Benadryl IV.  25 mg Demerol IV ANESTHESIA/SEDATION: Moderate (conscious) sedation was employed during this procedure. A total of Versed 2 mg and Fentanyl 100 mcg was administered intravenously. Moderate Sedation Time: 16 minutes. The patient's level of consciousness and vital signs were monitored continuously by radiology nursing throughout the procedure under my direct supervision. CONTRAST:  20mL OMNIPAQUE IOHEXOL 300 MG/ML SOLN - administered into the gallbladder fossa. FLUOROSCOPY TIME:  Fluoroscopic dose; 3 mGy COMPLICATIONS: None immediate. PROCEDURE: Informed written consent was obtained from the patient after a discussion of the risks, benefits and alternatives to treatment. Questions regarding the procedure were encouraged and answered. A timeout was performed prior to the initiation of the procedure. The right upper abdominal quadrant was prepped and draped in the usual sterile fashion,  and a sterile drape was applied covering the operative field. Maximum barrier sterile technique with sterile gowns and gloves were used for the procedure. A timeout was performed prior to the initiation of the procedure. Local anesthesia was provided with 1% lidocaine with epinephrine. Ultrasound scanning of the right upper quadrant demonstrates a markedly dilated gallbladder. Of note, the patient reported pain with ultrasound imaging over the  gallbladder. Utilizing a transhepatic approach, a 22 gauge needle was advanced into the gallbladder under direct ultrasound guidance. An ultrasound image was saved for documentation purposes. Appropriate intraluminal puncture was confirmed with the efflux of bile and advancement of an 0.018 wire into the gallbladder lumen. The needle was exchanged for an Accustick set. A small amount of contrast was injected to confirm appropriate intraluminal positioning. Over a Benson wire, a 10 Fr cholecystomy tube was advanced into the gallbladder fossa, coiled and locked. Bile was aspirated and a small amount of contrast was injected as several post procedural spot radiographic images were obtained in various obliquities. The catheter was secured to the skin with suture, connected to a drainage bag and a dressing was placed. The patient tolerated the procedure well without immediate post procedural complication. IMPRESSION: Successful ultrasound and fluoroscopic guided placement of a 10 Fr cholecystostomy tube. 70 mL of serous bilious fluid was aspirated. A sample submitted for microbiological analysis. RECOMMENDATIONS: The patient will return to [facility] ([facility]) for routine drainage catheter evaluation and exchange in 6-8 weeks. Roanna Banning, MD [facility] Radiology Electronically Signed   By: Roanna Banning M.D.   On: 03/11/2023 17:54   US Abdomen Limited RUQ (LIVER/GB) Result Date: 03/10/2023 CLINICAL DATA:  Right upper quadrant pain for 2 days. EXAM: ULTRASOUND ABDOMEN LIMITED RIGHT UPPER QUADRANT COMPARISON:  CT abdomen pelvis 03/09/2023 FINDINGS: Gallbladder: Dilated gallbladder with wall thickening measuring up to 7 mm. There is also sludge. Questionable stone. Common bile duct: Diameter: 2 mm.  Obscured by the known indwelling biliary stents. Liver: Homogeneous hepatic parenchyma. Tiny benign-appearing cyst in left hepatic lobe measuring 9 mm. This  was seen previously. Portal vein is patent on color Doppler imaging with normal direction of blood flow towards the liver. Other: Trace ascites. IMPRESSION: No biliary dilatation but the common duct is partially obscured by the known biliary stents. Dilated gallbladder with sludge and questionable stone. There is also significant wall thickening. Please correlate for any clinical evidence of acute cholecystitis although with the patient's history there is a differential. Further workup such as HIDA scan as clinically appropriate. Mild ascites. Please correlate with prior CT Electronically Signed   By: Karen Kays M.D.   On: 03/10/2023 19:41    Anti-infectives: Anti-infectives (From admission, onward)    Start     Dose/Rate Route Frequency Ordered Stop   03/10/23 0000  metroNIDAZOLE (FLAGYL) IVPB 500 mg        500 mg 100 mL/hr over 60 Minutes Intravenous Every 12 hours 03/09/23 1432 03/16/23 2359   03/09/23 2000  ceFEPIme (MAXIPIME) 2 g in sodium chloride 0.9 % 100 mL IVPB        2 g 200 mL/hr over 30 Minutes Intravenous Every 8 hours 03/09/23 1432     03/09/23 1100  piperacillin-tazobactam (ZOSYN) IVPB 3.375 g        3.375 g 100 mL/hr over 30 Minutes Intravenous  Once 03/09/23 1059 03/09/23 1217        Assessment/Plan  Cholecystitis  48 y/o F with stage IV rectal cancer with  carcinomatosis who presents with severe fever, severe RUQ pain, vomiting, and leukocytosis. She has evidence of cholecystitis on her CT, RUQ U/S, and on her ERCP From 02/26/23.  - s/p percutaneous cholecystostomy tube 2/26 by IR.  Cx w/ GNR. Patients pain and WBC are improved today.  - discussed with the patient that this tube may be permanent for her. Unfortunately she is not candidate for cholecystectomy due to the progressive cancer in the area of her liver. - infection improving s/p IR drain. No role for surgery. Call as needed  we will sign off.     LOS: 3 days   I reviewed nursing notes, Consultant IR,  oncology notes, hospitalist notes, last 24 h vitals and pain scores, last 48 h intake and output, last 24 h labs and trends, and last 24 h imaging results.  This care required straight-forward level of medical decision making.   Hosie Spangle, PA-C [facility] Please see Amion for pager number during day hours 7:00am-4:30pm

## 2023-03-13 ENCOUNTER — Other Ambulatory Visit: Payer: Self-pay | Admitting: Radiology

## 2023-03-13 DIAGNOSIS — E876 Hypokalemia: Secondary | ICD-10-CM | POA: Diagnosis not present

## 2023-03-13 LAB — PHOSPHORUS: Phosphorus: 2.5 mg/dL (ref 2.5–4.6)

## 2023-03-13 LAB — CBC
HCT: 28.1 % — ABNORMAL LOW (ref 36.0–46.0)
Hemoglobin: 8.6 g/dL — ABNORMAL LOW (ref 12.0–15.0)
MCH: 29.7 pg (ref 26.0–34.0)
MCHC: 30.6 g/dL (ref 30.0–36.0)
MCV: 96.9 fL (ref 80.0–100.0)
Platelets: 329 10*3/uL (ref 150–400)
RBC: 2.9 MIL/uL — ABNORMAL LOW (ref 3.87–5.11)
RDW: 16.4 % — ABNORMAL HIGH (ref 11.5–15.5)
WBC: 9.4 10*3/uL (ref 4.0–10.5)
nRBC: 0 % (ref 0.0–0.2)

## 2023-03-13 LAB — COMPREHENSIVE METABOLIC PANEL
ALT: 14 U/L (ref 0–44)
AST: 12 U/L — ABNORMAL LOW (ref 15–41)
Albumin: 2.4 g/dL — ABNORMAL LOW (ref 3.5–5.0)
Alkaline Phosphatase: 153 U/L — ABNORMAL HIGH (ref 38–126)
Anion gap: 8 (ref 5–15)
BUN: <5 mg/dL — ABNORMAL LOW (ref 6–20)
CO2: 28 mmol/L (ref 22–32)
Calcium: 8.3 mg/dL — ABNORMAL LOW (ref 8.9–10.3)
Chloride: 101 mmol/L (ref 98–111)
Creatinine, Ser: 0.36 mg/dL — ABNORMAL LOW (ref 0.44–1.00)
GFR, Estimated: >60 mL/min (ref >60)
Glucose, Bld: 74 mg/dL (ref 70–99)
Potassium: 3.8 mmol/L (ref 3.5–5.1)
Sodium: 137 mmol/L (ref 135–145)
Total Bilirubin: 0.3 mg/dL (ref 0.0–1.2)
Total Protein: 5.8 g/dL — ABNORMAL LOW (ref 6.5–8.1)

## 2023-03-13 LAB — MAGNESIUM: Magnesium: 1.7 mg/dL (ref 1.7–2.4)

## 2023-03-13 MED ORDER — AMLODIPINE BESYLATE 10 MG PO TABS
10.0000 mg | ORAL_TABLET | Freq: Every day | ORAL | Status: DC
  Administered 2023-03-13 – 2023-03-14 (×2): 10 mg via ORAL
  Filled 2023-03-13 (×2): qty 1

## 2023-03-13 MED ORDER — SODIUM CHLORIDE 0.9% FLUSH
10.0000 mL | Freq: Every day | INTRAVENOUS | Status: DC
  Administered 2023-03-13 – 2023-03-14 (×2): 10 mL

## 2023-03-13 MED ORDER — METOPROLOL TARTRATE 50 MG PO TABS
50.0000 mg | ORAL_TABLET | Freq: Two times a day (BID) | ORAL | Status: DC
  Administered 2023-03-13 – 2023-03-14 (×2): 50 mg via ORAL
  Filled 2023-03-13 (×2): qty 1

## 2023-03-13 NOTE — Progress Notes (Signed)
 PROGRESS NOTE    April [name]  WUJ:[id number] DOB: Feb 19, 1975 DOA: 03/09/2023 PCP: Tollie Eth, NP    Brief Narrative:   April [name] is a 48 y.o. 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 Meridee Score 02/26/2023), portal vein thrombus,, anemia of chronic medical disease, essential hypertension who presented to [facility] ED on 03/09/2023 from home via EMS with right right-sided abdominal pain associated with nausea and vomiting.  In the ED, temperature 101.3 F, HR 125, RR 22, BP 134/102.  SpO2 97% on room air.  WBC 16.5, hemoglobin 12.1, platelet count 387.  Sodium 137, potassium 2.7, chloride 96, CO2 28, glucose 110, BUN 6, creatinine 0.43.  INR 1.1.  Influenza A/B/RSV/COVID PCR negative.  Urinalysis with moderate hemoglobin otherwise unremarkable.  Chest x-ray with no acute cardiopulmonary disease process.  CT abdomen/pelvis with contrast with noted rectal carcinoma with peritoneal carcinomatosis, nodular thickening in the liver hilum slightly increased from previous, indwelling biliary stents with some improving biliary ductal dilation, heterogeneous appearance of the liver with more periportal edema and areas of potential developing portal vein peripheral branch thrombus, atrophy of the right kidney, bilateral nonobstructing renal stones.  Blood cultures x 2 obtained.  Patient received hydromorphone 1 mg IVP x 2, LR 1000 mL bolus, ondansetron 4 mg IVP, KCl 10 mEq IVPB and Zosyn 3.375 g IVPB.  Received additional normal saline 1000 mL liter bolus, prochlorperazine 10 mg IVP, ketorolac 30 mg IVP and hydromorphone 2 mg IVP x 1.  Hydromorphone 2 mg IVP x 1.  The case was discussed with primary oncologist by Dr. Myrle Sheng;  no anticoagulation recommended.  TRH consulted for admission.  Assessment & Plan:    Cholecystitis Patient presenting to ED with progressive right-sided abdominal pain.  Patient noted to be febrile with WBC count elevated at 16.5.  Right upper quadrant ultrasound with no biliary dilation but common duct partially obscured, dilated gallbladder with sludge and questionable stone, significant wall thickening.  General surgery was consulted and recommended IR percutaneous drain placement given she is a poor candidate for surgical management given her metastatic cancer. -- General Surgery/IR following, appreciate assistance -- WBC 16.5>27.2>26.4>12.6>9.4 -- Underwent cholecystostomy tube placement on 03/11/2023 -- Blood cultures x 2: No growth x 4 days -- Operative culture from cholecystostomy: Rare gram-negative rods on Gram stain, no growth x 2 days -- Cefepime 2 g IV every 8 hours -- Metronidazole 500 mg IV every 12 hours -- MS Contin 30 mg PO q12h -- Dilaudid 1-2 mg IV every 2 hours as needed severe pain -- Lorazepam 0.5 mg IV every 6 hours as needed anxiety/nausea -- Flush tube with 5 mL NS every 8 hours, monitor drain output; 145 mL past 24 hours -- Outpatient follow-up with IR 6 weeks for routine tube evaluation -- CBC daily  Hypokalemia Repleted.  Potassium 3.8 this morning.    Hypomagnesemia Repleted.  Magnesium 1.7 this morning.  Hypophosphatemia Repleted.  Phosphate 2.5 this morning.   Stage IV sigmoid colon cancer with peritoneal carcinomatosis, liver metastasis Follow with medical oncology outpatient, Dr. Truett Perna.  Currently on salvage therapy with irinotecan/panitumumab  -- Medical oncology, palliative care following; appreciate assistance  Anemia of chronic medical disease -- Hemoglobin 8.9>8.5, stable. --Transfuse hemoglobin less than 7.0  Essential Hypertension On metoprolol tartrate 50 mg p.o. twice daily at baseline.  -- Restart metoprolol tartrate 50 mg PO BID --  Restart home amlodipine 10 mg p.o. daily today -- Mild BP closely   DVT  prophylaxis: Heparin drip    Code Status: Full Code Family Communication: No family present at bedside  Disposition Plan:  Level of care: Progressive Status is: Inpatient Remains inpatient appropriate because: IV antibiotics, awaiting culture results; anticipate discharge home likely tomorrow    Consultants:  Medical oncology General Surgery Interventional radiology Palliative care  Procedures:  Cholecystostomy, IR 2/26  Antimicrobials:  Zosyn 2/24 - 2/24 Cefepime 2/24>> Metronidazole 2/24>>   Subjective: Patient seen examined bedside, sitting in bedside chair.  Pain remains manageable.  White count now normalized.  Remains on IV antibiotics.  No growth in blood or operative culture such far.  Discussed with patient as long as remains stable anticipate discharge home tomorrow. Patient with no other specific questions, concerns or complaints at this time.  Denies headache, no dizziness, no chest pain, no palpitations, no shortness of breath, no chills/night sweats, no current nausea/vomiting, no diarrhea, no focal weakness, no fatigue, no paresthesia.  No acute events overnight per nursing staff.  Objective: Vitals:   03/12/23 2031 03/13/23 0330 03/13/23 0825 03/13/23 0918  BP: (!) 179/122 (!) 174/114 (!) 170/112 (!) 154/121  Pulse: (!) 101 97 91 93  Resp: 16 15    Temp: 98.6 F (37 C) 98.1 F (36.7 C) 98 F (36.7 C)   TempSrc: Oral Oral Oral   SpO2: 94% 98%  100%  Weight:      Height:        Intake/Output Summary (Last 24 hours) at 03/13/2023 1311 Last data filed at 03/13/2023 0500 Gross per 24 hour  Intake 472.6 ml  Output 0 ml  Net 472.6 ml   Filed Weights   03/09/23 2042  Weight: 62.4 kg    Examination:  Physical Exam: GEN: NAD, alert and oriented x 3, chronically ill in appearance HEENT: NCAT, PERRL, EOMI, sclera clear, dry mucous membranes PULM: CTAB w/o wheezes/crackles, normal respiratory effort, on room air CV: RRR w/o M/G/R GI: abd soft,  nondistended, slight TTP RUQ, right midline, + BS, cholecystostomy drain noted MSK: no peripheral edema, moves all extremities independently NEURO: No focal neurological deficits PSYCH: normal mood/affect Integumentary: No concerning rashes/lesions/wounds noted on exposed skin surfaces    Data Reviewed: I have personally reviewed following labs and imaging studies  CBC: Recent Labs  Lab 03/09/23 0944 03/10/23 0348 03/11/23 0356 03/12/23 0147 03/13/23 0200  WBC 16.5* 27.2* 26.4* 12.6* 9.4  NEUTROABS 12.9* 23.1* 21.7* 8.1*  --   HGB 12.1 9.8* 8.9* 8.5* 8.6*  HCT 37.7 30.3* 28.2* 26.0* 28.1*  MCV 94.3 95.3 97.6 94.5 96.9  PLT 387 292 250 296 329   Basic Metabolic Panel: Recent Labs  Lab 03/09/23 0944 03/10/23 0348 03/11/23 0356 03/12/23 0147 03/13/23 0200  NA 137 136 134* 133* 137  K 2.7* 3.6 3.1* 3.3* 3.8  CL 96* 102 100 98 101  CO2 28 28 27 27 28   GLUCOSE 110* 94 104* 107* 74  BUN 6 9 7 6  <5*  CREATININE 0.43* 0.48 0.30* 0.43* 0.36*  CALCIUM 9.4 8.5* 8.2* 7.8* 8.3*  MG 1.7  --  1.6* 1.8 1.7  PHOS 4.1  --  2.3* 2.4* 2.5   GFR: Estimated Creatinine Clearance: 73.5 mL/min (A) (by C-G formula based on SCr of 0.36 mg/dL (L)). Liver Function Tests: Recent Labs  Lab 03/09/23 0944 03/10/23 0348 03/11/23 0356 03/12/23 0147 03/13/23 0200  AST 25 29 19 15  12*  ALT 32 29  23 19 14   ALKPHOS 216* 191* 177* 161* 153*  BILITOT 0.6 1.1 0.5 0.4 0.3  PROT 8.0 6.7 6.3* 6.1* 5.8*  ALBUMIN 3.6 2.8* 2.6* 2.4* 2.4*   No results for input(s): "LIPASE", "AMYLASE" in the last 168 hours. No results for input(s): "AMMONIA" in the last 168 hours. Coagulation Profile: Recent Labs  Lab 03/09/23 0944  INR 1.1   Cardiac Enzymes: No results for input(s): "CKTOTAL", "CKMB", "CKMBINDEX", "TROPONINI" in the last 168 hours. BNP (last 3 results) No results for input(s): "PROBNP" in the last 8760 hours. HbA1C: No results for input(s): "HGBA1C" in the last 72 hours. CBG: No results  for input(s): "GLUCAP" in the last 168 hours. Lipid Profile: No results for input(s): "CHOL", "HDL", "LDLCALC", "TRIG", "CHOLHDL", "LDLDIRECT" in the last 72 hours. Thyroid Function Tests: No results for input(s): "TSH", "T4TOTAL", "FREET4", "T3FREE", "THYROIDAB" in the last 72 hours. Anemia Panel: No results for input(s): "VITAMINB12", "FOLATE", "FERRITIN", "TIBC", "IRON", "RETICCTPCT" in the last 72 hours. Sepsis Labs: Recent Labs  Lab 03/09/23 0943  LATICACIDVEN 1.0    Recent Results (from the past 240 hours)  Blood Culture (routine x 2)     Status: None (Preliminary result)   Collection Time: 03/09/23  9:43 AM   Specimen: BLOOD  Result Value Ref Range Status   Specimen Description   Final    BLOOD RIGHT ANTECUBITAL Performed at [facility], [address], [place], [place] [address]    Special Requests   Final    BOTTLES DRAWN AEROBIC AND ANAEROBIC Blood Culture adequate volume Performed at [facility], [address] [place], [place] [address]    Culture   Final    NO GROWTH 4 DAYS Performed at [facility], [address] [place], [place] [address]    Report Status PENDING  Incomplete  Resp panel by RT-PCR (RSV, Flu A&B, Covid) Anterior Nasal Swab     Status: None   Collection Time: 03/09/23  9:44 AM   Specimen: Anterior Nasal Swab  Result Value Ref Range Status   SARS Coronavirus 2 by RT PCR NEGATIVE NEGATIVE Final    Comment: (NOTE) SARS-CoV-2 target nucleic acids are NOT DETECTED.  The SARS-CoV-2 RNA is generally detectable in upper respiratory specimens during the acute phase of infection. The lowest concentration of SARS-CoV-2 viral copies this assay can detect is 138 copies/mL. A negative result does not preclude SARS-Cov-2 infection and should not be used as the sole basis for treatment or other patient management decisions. A negative result may occur with  improper specimen collection/handling, submission of  specimen other than nasopharyngeal swab, presence of viral mutation(s) within the areas targeted by this assay, and inadequate number of viral copies(<138 copies/mL). A negative result must be combined with clinical observations, patient history, and epidemiological information. The expected result is Negative.  Fact Sheet for Patients:  BloggerCourse.com  Fact Sheet for Healthcare Providers:  SeriousBroker.it  This test is no t yet approved or cleared by the [place] FDA and  has been authorized for detection and/or diagnosis of SARS-CoV-2 by FDA under an Emergency Use Authorization (EUA). This EUA will remain  in effect (meaning this test can be used) for the duration of the COVID-19 declaration under Section 564(b)(1) of the Act, 21 U.S.C.section 360bbb-3(b)(1), unless the authorization is terminated  or revoked sooner.       Influenza A by PCR NEGATIVE NEGATIVE Final   Influenza B by PCR NEGATIVE NEGATIVE Final  Comment: (NOTE) The Xpert Xpress SARS-CoV-2/FLU/RSV plus assay is intended as an aid in the diagnosis of influenza from Nasopharyngeal swab specimens and should not be used as a sole basis for treatment. Nasal washings and aspirates are unacceptable for Xpert Xpress SARS-CoV-2/FLU/RSV testing.  Fact Sheet for Patients: BloggerCourse.com  Fact Sheet for Healthcare Providers: SeriousBroker.it  This test is not yet approved or cleared by the [place] FDA and has been authorized for detection and/or diagnosis of SARS-CoV-2 by FDA under an Emergency Use Authorization (EUA). This EUA will remain in effect (meaning this test can be used) for the duration of the COVID-19 declaration under Section 564(b)(1) of the Act, 21 U.S.C. section 360bbb-3(b)(1), unless the authorization is terminated or revoked.     Resp Syncytial Virus by PCR NEGATIVE NEGATIVE Final     Comment: (NOTE) Fact Sheet for Patients: BloggerCourse.com  Fact Sheet for Healthcare Providers: SeriousBroker.it  This test is not yet approved or cleared by the [place] FDA and has been authorized for detection and/or diagnosis of SARS-CoV-2 by FDA under an Emergency Use Authorization (EUA). This EUA will remain in effect (meaning this test can be used) for the duration of the COVID-19 declaration under Section 564(b)(1) of the Act, 21 U.S.C. section 360bbb-3(b)(1), unless the authorization is terminated or revoked.  Performed at [facility], [address], [place], [place] [address]   Blood Culture (routine x 2)     Status: None (Preliminary result)   Collection Time: 03/09/23 11:48 AM   Specimen: BLOOD RIGHT HAND  Result Value Ref Range Status   Specimen Description   Final    BLOOD RIGHT HAND Performed at [facility], [address], [place], [place] [address]    Special Requests   Final    BOTTLES DRAWN AEROBIC AND ANAEROBIC Blood Culture adequate volume Performed at [facility], [address] [place], [place] [address]    Culture   Final    NO GROWTH 4 DAYS Performed at [facility], [address] [place], [place] [address]    Report Status PENDING  Incomplete  Aerobic/Anaerobic Culture w Gram Stain (surgical/deep wound)     Status: None (Preliminary result)   Collection Time: 03/11/23  2:04 PM   Specimen: BILE  Result Value Ref Range Status   Specimen Description   Final    BILE Performed at [facility], [address], [place], [place] [address]    Special Requests   Final    NONE Performed at [facility], [address], [place], [place] [address]    Gram Stain   Final    FEW WBC PRESENT,BOTH PMN AND MONONUCLEAR RARE GRAM NEGATIVE RODS    Culture   Final    NO GROWTH 2 DAYS Performed at [facility], [address], [place], [place] [address]    Report Status PENDING  Incomplete         Radiology Studies: IR Perc Cholecystostomy Result Date: 03/11/2023 INDICATION: Acute cholecystitis EXAM: ULTRASOUND AND FLUOROSCOPIC-GUIDED CHOLECYSTOSTOMY TUBE PLACEMENT COMPARISON:  US Abdomen, 03/09/2023.  CT AP, 03/10/2023. MEDICATIONS: The patient is currently admitted to the hospital and on intravenous antibiotics. Antibiotics were administered within an appropriate time frame prior to skin puncture. 1 mg Dilaudid IV.  50 mg Benadryl IV.  25 mg Demerol IV ANESTHESIA/SEDATION: Moderate (conscious) sedation was employed during this procedure. A total of Versed 2 mg and Fentanyl 100 mcg was administered  intravenously. Moderate Sedation Time: 16 minutes. The patient's level of consciousness and vital signs were monitored continuously by radiology nursing throughout the procedure under my direct supervision. CONTRAST:  20mL OMNIPAQUE IOHEXOL 300 MG/ML SOLN - administered into the gallbladder fossa. FLUOROSCOPY TIME:  Fluoroscopic dose; 3 mGy COMPLICATIONS: None immediate. PROCEDURE: Informed written consent was obtained from the patient after a discussion of the risks, benefits and alternatives to treatment. Questions regarding the procedure were encouraged and answered. A timeout was performed prior to the initiation of the procedure. The right upper abdominal quadrant was prepped and draped in the usual sterile fashion, and a sterile drape was applied covering the operative field. Maximum barrier sterile technique with sterile gowns and gloves were used for the procedure. A timeout was performed prior to the initiation of the procedure. Local anesthesia was provided with 1% lidocaine with epinephrine. Ultrasound scanning of the right upper quadrant demonstrates a markedly dilated gallbladder. Of note, the patient reported pain with ultrasound imaging over the gallbladder. Utilizing a transhepatic approach,  a 22 gauge needle was advanced into the gallbladder under direct ultrasound guidance. An ultrasound image was saved for documentation purposes. Appropriate intraluminal puncture was confirmed with the efflux of bile and advancement of an 0.018 wire into the gallbladder lumen. The needle was exchanged for an Accustick set. A small amount of contrast was injected to confirm appropriate intraluminal positioning. Over a Benson wire, a 10 Fr cholecystomy tube was advanced into the gallbladder fossa, coiled and locked. Bile was aspirated and a small amount of contrast was injected as several post procedural spot radiographic images were obtained in various obliquities. The catheter was secured to the skin with suture, connected to a drainage bag and a dressing was placed. The patient tolerated the procedure well without immediate post procedural complication. IMPRESSION: Successful ultrasound and fluoroscopic guided placement of a 10 Fr cholecystostomy tube. 70 mL of serous bilious fluid was aspirated. A sample submitted for microbiological analysis. RECOMMENDATIONS: The patient will return to Vascular Interventional Radiology (VIR) for routine drainage catheter evaluation and exchange in 6-8 weeks. Roanna Banning, MD [facility] Radiology Electronically Signed   By: Roanna Banning M.D.   On: 03/11/2023 17:54        Scheduled Meds:  acetaminophen  1,000 mg Oral Q8H   Chlorhexidine Gluconate Cloth  6 each Topical Daily   enoxaparin (LOVENOX) injection  60 mg Subcutaneous Q12H   metoprolol tartrate  25 mg Oral BID   morphine  30 mg Oral Q12H   sodium chloride flush  10-40 mL Intracatheter Q12H   sodium chloride flush  5 mL Intracatheter Q8H   Continuous Infusions:  ceFEPime (MAXIPIME) IV 2 g (03/13/23 1243)   metronidazole 500 mg (03/12/23 2332)     LOS: 4 days    Time spent: 42 minutes spent on chart review, discussion with nursing staff, consultants,  updating family and interview/physical exam; more than 50% of that time was spent in counseling and/or coordination of care.    Alvira Philips [place], DO [facility] Available via Epic secure chat 7am-7pm After these hours, please refer to coverage provider listed on [email or web address] 03/13/2023, 1:11 PM

## 2023-03-13 NOTE — Discharge Instructions (Signed)
 Interventional Radiology Percutaneous Abscess Drain Placement After Care   This sheet gives you information about how to care for yourself after your procedure. Your health care provider may also give you more specific instructions. Your drain was placed by an interventional radiologist with [facility]. If you have questions or concerns, contact [facility] at [phone].   What is a percutaneous drain?   A drain is a small plastic tube (catheter) that goes into the fluid collection in your body through your skin.   How long will I need the drain?   How long the drain needs to stay in is determined by where the drain is, how much comes out of the drain each day and if you are having any other surgical procedures.   Interventional radiology will determine when it is time to remove the drain. It is important to follow up as directed so that the drain can be removed as soon as it is safe to do so.   What can I expect after the procedure?   After the procedure, it is common to have:   A small amount of bruising and discomfort in the area where the drainage tube (catheter) was placed.   Sleepiness and fatigue. This should go away after the medicines you were given have worn off.   Follow these instructions at home:   Insertion site care   Check your insertion site when you change the bandage. Check for:   More redness, swelling, or pain.   More fluid or blood.   Warmth.   Pus or a bad smell.   When caring for your insertion site:   Wash your hands with soap and water for at least 20 seconds before and after you change your bandage (dressing). If soap and water are not available, use hand sanitizer.   You do not need to change your dressing everyday if it is clean and dry. Change your dressing every 3 days or as needed when it is soiled, wet or becoming dislodged. You will need to change your dressing each time you shower.   Leave stitches (sutures), skin  glue, or adhesive strips in place. These skin closures may need to stay in place for 2 weeks or longer. If adhesive strip edges start to loosen and curl up, you may trim the loose edges. Do not remove adhesive strips completely unless your health care provider tells you to do so.   Catheter care   Flush the catheter once per day with 5 mL of 0.9% normal saline unless you are told otherwise by your healthcare provider. This helps to prevent clogs in the catheter.   To disconnect the drain, turn the clear plastic tube to the left. Attach the saline syringe by placing it on the white end of the drain and turning gently to the right. Once attached gently push the plunger to the 5 mL mark. After you are done flushing, disconnect the syringe by turning to the left and reattach your drainage container   If you have a bulb please be sure the bulb is charged after reconnecting it - to do this pinch the bulb between your thumb and first finger and close the stopper located on the top of the bulb.    Check for fluid leaking from around your catheter (instead of fluid draining through your catheter). This may be a sign that the drain is no longer working correctly.   Write down the following information every time you empty your  bag:   The date and time.   The amount of drainage.   Activity   Rest at home for 1-2 days after your procedure.   For the first 48 hours do not lift anything more than 10 lbs (about a gallon of milk). You may perform moderate activities/exercise. Please avoid strenuous activities during this time.   Avoid any activities which may pull on your drain as this can cause your drain to become dislodged.   If you were given a sedative during the procedure, it can affect you for several hours. Do not drive or operate machinery until your health care provider says that it is safe.   General instructions   For mild pain take over-the-counter medications as needed for pain such as  Tylenol or Advil. If you are experiencing severe pain please call our office as this may indicate an issue with your drain.    If you were prescribed an antibiotic medicine, take it as told by your health care provider. Do not stop using the antibiotic even if you start to feel better.   You may shower 24 hours after the drain is placed. To do this cover the insertion site with a water tight material such as saran wrap and seal the edges with tape, you may also purchase waterproof dressings at your local drug store. Shower as usual and then remove the water tight dressing and any gauze/tape underneath it once you have exited the shower and dried off. Allow the area to air dry or pat dry with a clean towel. Once the skin is completely dry place a new gauze dressing. It is important to keep the site dry at all times to prevent infection.   Do not submerge the drain - this means you cannot take baths, swim, use a hot tub, etc. until the drain is removed.    Do not use any products that contain nicotine or tobacco, such as cigarettes, e-cigarettes, and chewing tobacco. If you need help quitting, ask your health care provider.   Keep all follow-up visits as told by your health care provider. This is important.   Contact a health care provider if:   You have less than 10 mL of drainage a day for 2-3 days in a row, or as directed by your health care provider.   You have any of these signs of infection:   More redness, swelling, or pain around your incision area.   More fluid or blood coming from your incision area.   Warmth coming from your incision area.   Pus or a bad smell coming from your incision area.   You have fluid leaking from around your catheter (instead of through your catheter).   You are unable to flush the drain.   You have a fever or chills.   You have pain that does not get better with medicine.   You have not been contacted to schedule a drain follow up appointment  within 10 days of discharge from the hospital.   Please call [facility] at [phone] with any questions or concerns.   Get help right away if:   Your catheter comes out.   You suddenly stop having drainage from your catheter.   You suddenly have blood in the fluid that is draining from your catheter.   You become dizzy or you faint.   You develop a rash.   You have nausea or vomiting.   You have difficulty breathing or you feel short  of breath.   You develop chest pain.   You have problems with your speech or vision.   You have trouble balancing or moving your arms or legs.   Summary   It is common to have a small amount of bruising and discomfort in the area where the drainage tube (catheter) was placed. You may also have minor discomfort with movement while the drain is in place.   Flush the drain once per day with 5 mL of 0.9% normal saline (unless you were told otherwise by your healthcare provider).    Record the amount of drainage from the bag every time you empty it.   Change the dressing every 3 days or earlier if soiled/wet. Keep the skin dry under the dressing.   You may shower with the drain in place. Do not submerge the drain (no baths, swimming, hot tubs, etc.).   Contact [facility] at [phone] if you have more redness, swelling, or pain around your incision area or if you have pain that does not get better with medicine.   This information is not intended to replace advice given to you by your health care provider. Make sure you discuss any questions you have with your health care provider.   Document Revised: 04/04/2021 Document Reviewed: 12/25/2018   [employer]vier Patient Education  2023 [employer]         Interventional Radiology Drain Record   Empty your drain at least once per day. You may empty it as often as needed. Use this form to write down the amount of fluid that has collected in the drainage container. Bring  this form with you to your follow-up visits. Please call [facility] at [phone] with any questions or concerns prior to your appointment.   Drain #1 location: ___________________   Date __________ Time __________ Amount __________   Date __________ Time __________ Amount __________   Date __________ Time __________ Amount __________   Date __________ Time __________ Amount __________   Date __________ Time __________ Amount __________   Date __________ Time __________ Amount __________   Date __________ Time __________ Amount __________   Date __________ Time __________ Amount __________   Date __________ Time __________ Amount __________   Date __________ Time __________ Amount __________   Date __________ Time __________ Amount __________   Date __________ Time __________ Amount __________   Date __________ Time __________ Amount __________   Date __________ Time __________ Amount __________

## 2023-03-13 NOTE — TOC CM/SW Note (Signed)
 Transition of Care [facility]) - Inpatient Brief Assessment   Patient Details  Name: April [name] MRN: [id number] Date of Birth: [birth year]  Transition of Care [facility]) CM/SW Contact:    Larrie Kass, LCSW Phone Number: 03/13/2023, 11:30 AM    Transition of Care Asessment: Insurance and Status: Insurance coverage has been reviewed Patient has primary care physician: Yes Home environment has been reviewed: home with self Prior level of function:: independent Prior/Current Home Services: No current home services Social Drivers of Health Review: SDOH reviewed no interventions necessary Readmission risk has been reviewed: Yes Transition of care needs: no transition of care needs at this time

## 2023-03-13 NOTE — Plan of Care (Signed)
  Problem: Respiratory: Goal: Ability to maintain adequate ventilation will improve Outcome: Progressing   Problem: Education: Goal: Knowledge of General Education information will improve Description: Including pain rating scale, medication(s)/side effects and non-pharmacologic comfort measures Outcome: Progressing   Problem: Activity: Goal: Risk for activity intolerance will decrease Outcome: Progressing   Problem: Nutrition: Goal: Adequate nutrition will be maintained Outcome: Progressing   Problem: Coping: Goal: Level of anxiety will decrease Outcome: Progressing   Problem: Elimination: Goal: Will not experience complications related to bowel motility Outcome: Progressing

## 2023-03-13 NOTE — Progress Notes (Signed)
   03/13/23 1110  Spiritual Encounters  Type of Visit Initial  Care provided to: Patient  Referral source Other (comment) (Spiritual Consult)  Reason for visit Advance directives  OnCall Visit No   Chaplain responded to a spiritual consult for advanced directive education. The patient, April [name] was alert and welcoming of me into her space. I addressed her concerns with the forms and left the paperwork with her. Lavine planned to work on the documents in her time.   Valerie Roys [facility]  [phone]

## 2023-03-13 NOTE — Progress Notes (Addendum)
 April [name]   DOB:06-Aug-1975   UJ#:[id number]      ASSESSMENT & PLAN:  1.  Severe abdominal pain Nausea/vomiting - Improving significantly at this time - Admitted via ED 03/09/2023 with complaints of severe abdominal pain - CT scan abdomen pelvis done 03/09/2023, nodular thickening in the liver hilum slightly increased from previous.  Indwelling biliary stents showed improving biliary ductal dilatation. - Admitted and discharged 02/27/2023 with same complaints.  ERCP with stent exchange done 02/26/2023. - Oral Dilaudid at home was prescribed as patient reported no relief from MS Contin or hydrocodone.  Continue pain management during this hospitalization. - Continue antiemetics as ordered - GI evaluation done, no intervention at this point. - Abdominal ultrasound 03/10/2023 shows dilated gallbladder with sludge and questionable stone. - Seen by Surgery, pt not surgical candidate, recommended drainage,   S/p percutaneous cholecystostomy tube placed 03/11/23, fluid continues to drain well clear yellow drainage with slight streaks of red.   Molli Knock for discharge from oncology viewpoint.  Patient will follow-up with Dr. Truett Perna as outpatient.   2.  Sigmoid colon cancer, stage IV - Initially diagnosed 2021 - Status post FOLFIRI/bevacizumab and Udenyca given in 2021.  From 2022 to 2023 FOLFIRI with panitumumab was given.   -Maintenance Xeloda was given in 2022.  Lonsurf was given in 2023. Then in 2024 patient received FOLFOX therapy.   -Currently on salvage therapy with irinotecan/panitumumab.  Received cycle four 02/17/2023 and tolerated well.     -Seen in outpatient oncology 03/03/2023.  She had an outpatient appointment scheduled to 03/11/23, will be rescheduled upon discharge. - Medical oncology/Dr. Truett Perna following closely   3.  Anemia - Likely due to malignancy and recent chemotherapy - Hemoglobin 8.6 stable - Transfuse PRBC for Hgb <7.0. - No transfusional intervention indicated  at this time - Continue to monitor CBC with differential   4.  Leukocytosis - Resolved - WBC continues to decrease 9.4 today  - Continue IV antibiotics - Monitor fever curve   5.  Hypertension - Continue antihypertensives - Monitor BP levels       Code Status Full   Subjective:  Patient seen awake and alert laying in bed.  Reports abdominal pain has significantly decreased, patient expressed gratitude regarding this.  Aware of follow-up with Dr. Truett Perna next week.  No other acute complaints offered.  Objective:  Vitals:   03/13/23 0825 03/13/23 0918  BP: (!) 170/112 (!) 154/121  Pulse: 91 93  Resp:    Temp: 98 F (36.7 C)   SpO2:  100%     Intake/Output Summary (Last 24 hours) at 03/13/2023 0936 Last data filed at 03/13/2023 0500 Gross per 24 hour  Intake 472.6 ml  Output 0 ml  Net 472.6 ml     REVIEW OF SYSTEMS:   Constitutional: Denies fevers, chills or abnormal night sweats Eyes: Denies blurriness of vision, double vision or watery eyes Ears, nose, mouth, throat, and face: Denies mucositis or sore throat Respiratory: Denies cough, dyspnea or wheezes Cardiovascular: Denies palpitation, chest discomfort or lower extremity swelling Gastrointestinal: + Abdominal pain Skin: Denies abnormal skin rashes Lymphatics: Denies new lymphadenopathy or easy bruising Neurological: Denies numbness, tingling or new weaknesses Behavioral/Psych: Mood is stable, no new changes  All other systems were reviewed with the patient and are negative.  PHYSICAL EXAMINATION: ECOG PERFORMANCE STATUS: 1 - Symptomatic but completely ambulatory  Vitals:   03/13/23 0825 03/13/23 0918  BP: (!) 170/112 (!) 154/121  Pulse: 91 93  Resp:  Temp: 98 F (36.7 C)   SpO2:  100%   Filed Weights   03/09/23 2042  Weight: 137 lb 9.1 oz (62.4 kg)    GENERAL: alert, no distress and comfortable SKIN: skin color, texture, turgor are normal, no rashes or significant lesions EYES: normal,  conjunctiva are pink and non-injected, sclera clear OROPHARYNX: no exudate, no erythema and lips, buccal mucosa, and tongue normal  NECK: supple, thyroid normal size, non-tender, without nodularity LYMPH: no palpable lymphadenopathy in the cervical, axillary or inguinal LUNGS: clear to auscultation and percussion with normal breathing effort HEART: regular rate & rhythm and no murmurs and no lower extremity edema ABDOMEN: + Abdominal drainage intact  MUSCULOSKELETAL: no cyanosis of digits and no clubbing  PSYCH: alert & oriented x 3 with fluent speech NEURO: no focal motor/sensory deficits   All questions were answered. The patient knows to call the clinic with any problems, questions or concerns.   The total time spent in the appointment was 40 minutes encounter with patient including review of chart and various tests results, discussions about plan of care and coordination of care plan  Dawson Bills, NP 03/13/2023 9:36 AM    Labs Reviewed:  Lab Results  Component Value Date   WBC 9.4 03/13/2023   HGB 8.6 (L) 03/13/2023   HCT 28.1 (L) 03/13/2023   MCV 96.9 03/13/2023   PLT 329 03/13/2023   Recent Labs    03/27/22 1212 03/31/22 0945 11/21/22 0557 11/22/22 0710 12/16/22 0505 12/17/22 0500 03/11/23 0356 03/12/23 0147 03/13/23 0200  NA  --    < > 138   < > 132*   < > 134* 133* 137  K 3.8   < > 3.5   < > 2.9*   < > 3.1* 3.3* 3.8  CL  --    < > 101   < > 97*   < > 100 98 101  CO2  --    < > 24   < > 29   < > 27 27 28   GLUCOSE  --    < > 139*   < > 97   < > 104* 107* 74  BUN  --    < > 9   < > 8   < > 7 6 <5*  CREATININE  --    < > 0.58   < > 0.52   < > 0.30* 0.43* 0.36*  CALCIUM  --    < > 10.3   < > 8.5*   < > 8.2* 7.8* 8.3*  GFRNONAA  --    < > >60   < > >60   < > >60 >60 >60  PROT 6.8   < > 8.9*   < > 7.1   < > 6.3* 6.1* 5.8*  ALBUMIN 3.2*   < > 4.4  4.5   < > 3.2*   < > 2.6* 2.4* 2.4*  AST 78*   < > 35   < > 37   < > 19 15 12*  ALT 172*   < > 24   < > 28   < > 23  19 14   ALKPHOS 506*   < > 172*   < > 204*   < > 177* 161* 153*  BILITOT 2.3*   < > 0.6   < > 0.5   < > 0.5 0.4 0.3  BILIDIR 1.1*  --  <0.1  --  0.1  --   --   --   --  IBILI 1.2*  --  NOT CALCULATED  --  0.4  --   --   --   --    < > = values in this interval not displayed.    Studies Reviewed:  IR Perc Cholecystostomy Result Date: 03/11/2023 INDICATION: Acute cholecystitis EXAM: ULTRASOUND AND FLUOROSCOPIC-GUIDED CHOLECYSTOSTOMY TUBE PLACEMENT COMPARISON:  US Abdomen, 03/09/2023.  CT AP, 03/10/2023. MEDICATIONS: The patient is currently admitted to the hospital and on intravenous antibiotics. Antibiotics were administered within an appropriate time frame prior to skin puncture. 1 mg Dilaudid IV.  50 mg Benadryl IV.  25 mg Demerol IV ANESTHESIA/SEDATION: Moderate (conscious) sedation was employed during this procedure. A total of Versed 2 mg and Fentanyl 100 mcg was administered intravenously. Moderate Sedation Time: 16 minutes. The patient's level of consciousness and vital signs were monitored continuously by radiology nursing throughout the procedure under my direct supervision. CONTRAST:  20mL OMNIPAQUE IOHEXOL 300 MG/ML SOLN - administered into the gallbladder fossa. FLUOROSCOPY TIME:  Fluoroscopic dose; 3 mGy COMPLICATIONS: None immediate. PROCEDURE: Informed written consent was obtained from the patient after a discussion of the risks, benefits and alternatives to treatment. Questions regarding the procedure were encouraged and answered. A timeout was performed prior to the initiation of the procedure. The right upper abdominal quadrant was prepped and draped in the usual sterile fashion, and a sterile drape was applied covering the operative field. Maximum barrier sterile technique with sterile gowns and gloves were used for the procedure. A timeout was performed prior to the initiation of the procedure. Local anesthesia was provided with 1% lidocaine with epinephrine. Ultrasound scanning of the  right upper quadrant demonstrates a markedly dilated gallbladder. Of note, the patient reported pain with ultrasound imaging over the gallbladder. Utilizing a transhepatic approach, a 22 gauge needle was advanced into the gallbladder under direct ultrasound guidance. An ultrasound image was saved for documentation purposes. Appropriate intraluminal puncture was confirmed with the efflux of bile and advancement of an 0.018 wire into the gallbladder lumen. The needle was exchanged for an Accustick set. A small amount of contrast was injected to confirm appropriate intraluminal positioning. Over a Benson wire, a 10 Fr cholecystomy tube was advanced into the gallbladder fossa, coiled and locked. Bile was aspirated and a small amount of contrast was injected as several post procedural spot radiographic images were obtained in various obliquities. The catheter was secured to the skin with suture, connected to a drainage bag and a dressing was placed. The patient tolerated the procedure well without immediate post procedural complication. IMPRESSION: Successful ultrasound and fluoroscopic guided placement of a 10 Fr cholecystostomy tube. 70 mL of serous bilious fluid was aspirated. A sample submitted for microbiological analysis. RECOMMENDATIONS: The patient will return to [facility] ([facility]) for routine drainage catheter evaluation and exchange in 6-8 weeks. Roanna Banning, MD [facility] Radiology Electronically Signed   By: Roanna Banning M.D.   On: 03/11/2023 17:54   US Abdomen Limited RUQ (LIVER/GB) Result Date: 03/10/2023 CLINICAL DATA:  Right upper quadrant pain for 2 days. EXAM: ULTRASOUND ABDOMEN LIMITED RIGHT UPPER QUADRANT COMPARISON:  CT abdomen pelvis 03/09/2023 FINDINGS: Gallbladder: Dilated gallbladder with wall thickening measuring up to 7 mm. There is also sludge. Questionable stone. Common bile duct: Diameter: 2 mm.  Obscured by the known  indwelling biliary stents. Liver: Homogeneous hepatic parenchyma. Tiny benign-appearing cyst in left hepatic lobe measuring 9 mm. This was seen previously. Portal vein is patent on color Doppler imaging with normal direction  of blood flow towards the liver. Other: Trace ascites. IMPRESSION: No biliary dilatation but the common duct is partially obscured by the known biliary stents. Dilated gallbladder with sludge and questionable stone. There is also significant wall thickening. Please correlate for any clinical evidence of acute cholecystitis although with the patient's history there is a differential. Further workup such as HIDA scan as clinically appropriate. Mild ascites. Please correlate with prior CT Electronically Signed   By: Karen Kays M.D.   On: 03/10/2023 19:41   CT ABDOMEN PELVIS W CONTRAST Result Date: 03/09/2023 CLINICAL DATA:  Rectal cancer with liver metastases with abdominal pain. * Tracking Code: BO * EXAM: CT ABDOMEN AND PELVIS WITH CONTRAST TECHNIQUE: Multidetector CT imaging of the abdomen and pelvis was performed using the standard protocol following bolus administration of intravenous contrast. RADIATION DOSE REDUCTION: This exam was performed according to the departmental dose-optimization program which includes automated exposure control, adjustment of the mA and/or kV according to patient size and/or use of iterative reconstruction technique. CONTRAST:  OMNIPAQUE IOHEXOL 300 MG/ML  SOLN COMPARISON:  CT 02/24/2023. FINDINGS: Lower chest: There is some linear opacity lung bases likely scar or atelectasis. No pleural effusion. Hepatobiliary: There is areas of fatty liver infiltration particularly geographically along the right hepatic lobe more so than left. This is progressive from previous examination. Portal vein is patent grossly but somewhat narrowed by extrinsic compression. There are indwelling endoscopically placed stents. Level of intrahepatic biliary duct dilatation is  slightly improving on the left and some on the right. The gallbladder continues to be dilated with some wall edema which is increasing. There is more nodular tissue extending up along the porta hepatis as best seen on coronal series 8, image 49. This has a differential including progression of disease. There are some areas of progressive portal vein occlusion such is anteriorly in the right hepatic lobe inferiorly best seen on coronal imaging. Pancreas: No pancreatic mass. Persistent pancreas ductal dilatation, similar to previous. Spleen: Normal in size without focal abnormality. Adrenals/Urinary Tract: Adrenal glands are preserved. Bilateral nonobstructing renal stones. No enhancing mass or collecting system dilatation. Preserved contour to the urinary bladder. Areas of atrophy in the right kidney are again noted. Stomach/Bowel: On this non oral contrast exam large bowel has a normal course and caliber with scattered stool. There are loops of decompressed transverse colon. The small bowel is nondilated. Along the rectum once again is nodular wall thickening with extension into the adjacent tissue consistent with known history of neoplasm. Nodular areas are seen extending perirectal and presacral spaces. Vascular/Lymphatic: Normal caliber aorta and IVC. There is ectasia of the left common iliac artery with diameter approaching 19 mm. Scattered atherosclerotic calcified plaque. Several small less than 1 cm size in short axis retroperitoneal nodes are seen, not pathologic by size criteria. Reproductive: Uterus is present. Other: Peritoneal carcinomatosis again identified. Example measured previously anterior in the central upper abdomen with transverse dimension of 2.2 cm. Today this is measured 2.5 cm. Other areas are also similar including the pericolic gutters. There is also thickening along the surface of the liver towards the dome on the right side and laterally. Some these areas appear more prominent today than  the recent prior this could be technical. Musculoskeletal: Curvature of the spine with some degenerative changes. 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 IMPRESSION: Again note is made of rectal carcinoma with peritoneal carcinomatosis. Trace ascites. The level of nodular thickening in the liver hilum appears slightly increased from previous.  Indwelling biliary stents with some proving biliary ductal dilatation compared to the prior examination. However there is a more heterogeneous appearance of the liver with more periportal edema and areas of potential developing portal vein peripheral branch thrombus particularly along the inferior aspect of the liver. Bilateral nonobstructing renal stones.  Atrophy of the right kidney. Critical Value/emergent results were called by telephone at the time of interpretation on 03/09/2023 at 11:28 am to provider [facility] , who verbally acknowledged these results. Electronically Signed   By: Karen Kays M.D.   On: 03/09/2023 11:49   DG Chest Port 1 View Result Date: 03/09/2023 CLINICAL DATA:  48 year old female with possible sepsis. Metastatic rectal cancer. EXAM: PORTABLE CHEST 1 VIEW COMPARISON:  CT Chest, Abdomen, and Pelvis 01/24/2023 and earlier. FINDINGS: Portable AP semi upright view at 0949 hours. Lung volumes and mediastinal contours remain normal. Left side PICC line remains in place. Visualized tracheal air column is within normal limits. Allowing for portable technique the lungs are clear. No pneumothorax or pleural effusion. No evidence of pneumoperitoneum. Negative visible bowel gas. No acute osseous abnormality identified. IMPRESSION: No acute cardiopulmonary abnormality. Electronically Signed   By: Odessa Fleming M.D.   On: 03/09/2023 10:23   DG ERCP Result Date: 02/27/2023 CLINICAL DATA:  Encounter for removal of biliary stent. EXAM: ERCP TECHNIQUE: Multiple spot images obtained with the fluoroscopic device and submitted for interpretation  post-procedure. FLUOROSCOPY: Radiation Exposure Index (as provided by the fluoroscopic device): 106.44 mGy Kerma COMPARISON:  CT 02/24/2023 FINDINGS: Initial image demonstrates 2 plastic biliary stents. Biliary stents removed. Retrograde cholangiogram was performed. Cholangiogram images demonstrate narrowing and irregularity near the biliary confluence and involving the left and right hepatic ducts. Wires were advanced into the biliary ducts. Evidence for balloon dilatation of the biliary ducts. Final images demonstrate placement of 2 new plastic biliary stents presumably in the left and right hepatic ducts. IMPRESSION: 1. 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 These areas were dilated and new biliary stents were placed. These images were submitted for radiologic interpretation only. Please see the procedural report. Electronically Signed   By: Richarda Overlie M.D.   On: 02/27/2023 09:03   CT ABDOMEN PELVIS W CONTRAST Result Date: 02/24/2023 CLINICAL DATA:  Abdominal pain EXAM: CT ABDOMEN AND PELVIS WITH CONTRAST TECHNIQUE: Multidetector CT imaging of the abdomen and pelvis was performed using the standard protocol following bolus administration of intravenous contrast. RADIATION DOSE REDUCTION: This exam was performed according to the departmental dose-optimization program which includes automated exposure control, adjustment of the mA and/or kV according to patient size and/or use of iterative reconstruction technique. CONTRAST:  75mL OMNIPAQUE IOHEXOL 300 MG/ML  SOLN COMPARISON:  01/24/2023 FINDINGS: Lower chest: No acute abnormality. Hepatobiliary: Redemonstration of subtly hypoattenuating lesion within the peripheral aspect of hepatic segment 2 (series 2, image 20). Additional scattered subcentimeter low-density lesions within the liver remain too small to characterize. Prominent intrahepatic biliary dilatation with biliary stents in place. Slightly  hyperattenuating material fills the gallbladder. No pericholecystic inflammatory changes by CT. Pancreas: Stable mild pancreatic ductal dilatation. No inflammatory changes. Spleen: Normal in size without focal abnormality. Adrenals/Urinary Tract: Unremarkable adrenal glands. Stable bilateral nephrolithiasis with stones measuring up to 1.1 cm on the left and 0.9 cm on the right. No hydronephrosis. Urinary bladder within normal limits for the degree of distension. Stomach/Bowel: Stomach is within normal limits. Appendix appears normal. Similar degree of wall thickening of the rectosigmoid colon. No new sites of  bowel wall thickening or inflammation. Vascular/Lymphatic: Scattered aortoiliac atherosclerotic calcifications without aneurysm. No abdominopelvic lymphadenopathy. Reproductive: Uterus and bilateral adnexa are unremarkable. Other: Similar degree of nodularity along the omentum and left pericolic gutter. No ascites or pneumoperitoneum. Musculoskeletal: No lytic or sclerotic bone lesion. Grade 2 anterolisthesis of L5 on S1 secondary to chronic bilateral L5 pars interarticularis defects. IMPRESSION: 1. No new or acute abdominopelvic findings. 2. 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 3. Prominent intrahepatic biliary dilatation with biliary stents in place. 4. Stable bilateral nephrolithiasis. 5. Aortic atherosclerosis (ICD10-I70.0). Electronically Signed   By: Duanne Guess D.O.   On: 02/24/2023 18:49   April [name] was interviewed and examined.  She continues to have abdominal pain, but this has improved following placement of the cholecystostomy tube.  We discussed treatment options for the metastatic colon cancer.  She understands it is possible the colon cancer contributed to the presentation with cholecystitis.  She will continue narcotic analgesics and antibiotics as directed by the medical service.  Outpatient follow-up  will be scheduled at the Cancer center.  We will consider resuming treatment for metastatic colon cancer when she recovers from the cholecystitis.  Recommendations: Continue home narcotic analgesic regimen for pain Continue antibiotics as recommended by the medical service Hypertension management Please call oncology as needed over the weekend, I will check on her 03/16/2023 if she remains in the hospital and outpatient follow-up will be scheduled at the Cancer center

## 2023-03-13 NOTE — Progress Notes (Signed)
 Daily Progress Note   Patient Name: April [name]       Date: 03/13/2023 DOB: [birth year]  Age: 48 y.o. MRN#: [id number] Attending Physician: [place], Eric J, DO Primary Care Physician: Tollie Eth, NP Admit Date: 03/09/2023  Reason for Consultation/Follow-up: Establishing goals of care and Pain control  Patient Profile/HPI: 48 y.o. female  with past medical history significant for metastatic renal carcinoma, essential HTN, renal calculi, AAA, biliary dilatation and prolonged QT interval s/p ERCP with stent placement 02/26/2023, followed by Dr. Truett Perna with last chemotherapy 02/17/2023.  Patient presented to ED 03/09/2023 complaining of severe right upper quadrant/epigastric pain with associated nausea and vomiting.  ED workup found patient to be hypokalemic with leukocytosis and fever meeting SIRS criteria.  She was referred to general surgery for management of acute cholecystitis but found not to be a candidate for cholecystectomy due to degree of tumor burden in the area.  She was referred to IR for cholecystostomy drain for which she is scheduled later today. Ms. Eischeid was referred to palliative care for symptom management.  Subjective: Patient awake, alert.  Chart reviewed- per Oncology note- stable for discharge from their viewpoint. Attending team notes likely d/c home tomorrow.  [place] reports her pain is improving. Medication use reviewed-  hydromorphone IV 2mg  received at 0818 this morning- overall dependence on IV hydromorphone is decreasing with start of po hydromorphone. Encouraged use of po hydromorphone. She appreciates the decrease in hydromorphone po to 2mg - felt that the 4mg  was making her too sleepy. Does not feel any further adjustments need to be made.     Review of Systems  Constitutional:  Negative for fever.  Gastrointestinal:  Positive for abdominal pain and nausea.     Physical Exam Vitals reviewed.  Constitutional:      General: She is not in acute distress.    Appearance: She is ill-appearing.  Pulmonary:     Effort: Pulmonary effort is normal. No respiratory distress.  Abdominal:     Tenderness: There is abdominal tenderness.     Comments: + RUQ adb drain- C/D/I with serosanguinous OP in gravity bag  Skin:    General: Skin is warm and dry.  Neurological:     Mental Status: She is alert and oriented to person, place, and time.  Psychiatric:  Mood and Affect: Mood normal.        Behavior: Behavior normal.        Thought Content: Thought content normal.        Judgment: Judgment normal.             Vital Signs: BP (!) 154/121 (BP Location: Right Arm)   Pulse 93   Temp 98 F (36.7 C) (Oral)   Resp 15   Ht 5\' 4"  (1.626 m)   Wt 62.4 kg   LMP 01/07/2006   SpO2 100%   BMI 23.61 kg/m  SpO2: SpO2: 100 % O2 Device: O2 Device: Room Air O2 Flow Rate: O2 Flow Rate (L/min): 2 L/min  Intake/output summary:  Intake/Output Summary (Last 24 hours) at 03/13/2023 1435 Last data filed at 03/13/2023 1327 Gross per 24 hour  Intake 477.6 ml  Output 0 ml  Net 477.6 ml   LBM: Last BM Date : 03/13/23 Baseline Weight: Weight: 62.4 kg Most recent weight: Weight: 62.4 kg       Palliative Assessment/Data: 70%      Patient Active Problem List   Diagnosis Date Noted   SIRS (systemic inflammatory response syndrome) (HCC) 03/09/2023   Abdominal pain, chronic, right upper quadrant 03/09/2023   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   Fever 03/09/2023   Nausea and vomiting 02/26/2023   Upper abdominal pain 02/24/2023   Intractable vomiting 01/25/2023   Hyperproteinemia 01/24/2023   Elevated alkaline phosphatase level 01/24/2023   History of biliary stent insertion 12/31/2022   Intrahepatic bile duct dilation 12/31/2022    Imaging of gastrointestinal tract abnormal 12/31/2022   Hospital discharge follow-up 12/31/2022   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   Enterocolitis 12/26/2022   Bacteremia due to Klebsiella pneumoniae 12/26/2022   Hyponatremia 12/24/2022   Dehydration 12/16/2022   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   Ascending aortic aneurysm (HCC) 12/16/2022   Rectal polyp 11/23/2022   Diverticulosis of colon without hemorrhage 11/23/2022   Abnormal CT of the abdomen 11/21/2022   Cancer related pain 11/19/2022   Acute nonintractable headache 08/11/2022   History of ERCP 06/28/2022   Former smoker 04/30/2022   Intractable nausea and vomiting 04/16/2022   Protein-calorie malnutrition, severe 03/26/2022   Chronic constipation 03/26/2022   Metastatic colon cancer to liver (HCC) 03/24/2022   Obstructive jaundice 03/24/2022   Biliary stricture 03/24/2022   Biliary obstruction 03/23/2022   Hyperbilirubinemia 03/21/2022   Abnormal LFTs 03/21/2022   Nephrolithiasis 03/21/2022   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   Renal calculi    Hydronephrosis of right kidney    Colorectal cancer, stage IV (HCC) 10/15/2020   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   Port-A-Cath in place 11/22/2019   High risk medication use 06/07/2019   Adnexal mass 04/09/2019   Rectal cancer (HCC) 04/08/2019   Constipation 03/14/2019   Hypertension 01/07/2018   Sinus tachycardia 01/07/2018   Hypokalemia 01/07/2018   Prolonged QT interval 01/07/2018   Hyperglycemia 01/07/2018   Tobacco use 01/07/2018   Benign essential hypertension 10/06/2017    Palliative Care Assessment & Plan    Assessment  Cholecystitis- Underwent IR cholecystostomy drain placement 03/11/2023 WBC now down to 9.4 today from 27.49max post drain placement- pain much improved 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 Would still recommend caution since elevated Qtc noted on admission.  Ativan IV 0.5mg  q 6hrs prn nausea.  Appreciate Spiritual consult to complete HPOA documentation per patient request  Palliative to continue to follow for symptom management    Code Status:   Code Status: Full Code   Prognosis:  Unable to determine  Discharge Planning: Home Thank you for allowing the Palliative Medicine Team to assist in the care of this patient.   Marylin [facility]- [facility] Palliative Medicine Team  03/13/2023 2:35 PM  Office [phone]  Pager [phone]    Total time: 50 minutes   Preparing to see the patient (e.g., review of tests) Obtaining and/or reviewing separately obtained history Performing a medically necessary appropriate examination and/or evaluation Counseling and educating the patient/family/caregiver Ordering medications, tests, or procedures Referring and communicating with other health care professionals (when not reported separately) Documenting clinical information in the electronic or other health record Independently interpreting results (not reported separately) and communicating results to the patient/family/caregiver Care coordination (not reported separately) Clinical documentation    Please contact Palliative Medicine Team phone at [phone] for questions and concerns.

## 2023-03-14 ENCOUNTER — Other Ambulatory Visit (HOSPITAL_COMMUNITY): Payer: Self-pay

## 2023-03-14 ENCOUNTER — Encounter: Payer: Self-pay | Admitting: Oncology

## 2023-03-14 DIAGNOSIS — E876 Hypokalemia: Secondary | ICD-10-CM | POA: Diagnosis not present

## 2023-03-14 LAB — CULTURE, BLOOD (ROUTINE X 2)
Culture: NO GROWTH
Culture: NO GROWTH
Special Requests: ADEQUATE
Special Requests: ADEQUATE

## 2023-03-14 MED ORDER — AMOXICILLIN-POT CLAVULANATE 875-125 MG PO TABS
1.0000 | ORAL_TABLET | Freq: Two times a day (BID) | ORAL | 0 refills | Status: AC
  Filled 2023-03-14: qty 16, 8d supply, fill #0

## 2023-03-14 MED ORDER — HYDRALAZINE HCL 20 MG/ML IJ SOLN
10.0000 mg | Freq: Four times a day (QID) | INTRAMUSCULAR | Status: DC | PRN
  Administered 2023-03-14: 10 mg via INTRAVENOUS
  Filled 2023-03-14: qty 1

## 2023-03-14 MED ORDER — NORMAL SALINE FLUSH 0.9 % IV SOLN
5.0000 mL | Freq: Every day | INTRAVENOUS | 3 refills | Status: DC
  Filled 2023-03-14: qty 150, 30d supply, fill #0

## 2023-03-14 MED ORDER — HYDROMORPHONE HCL 2 MG PO TABS
2.0000 mg | ORAL_TABLET | ORAL | 0 refills | Status: DC | PRN
  Filled 2023-03-14: qty 100, 16d supply, fill #0
  Filled 2023-03-14: qty 20, 4d supply, fill #0

## 2023-03-14 NOTE — Progress Notes (Signed)
 TOC meds delivered in  a secure bag to pt in room  by this RN

## 2023-03-14 NOTE — Discharge Summary (Signed)
 Physician Discharge Summary  April [name] ZOX:[id number] DOB: 28-Dec-1975 DOA: 03/09/2023  PCP: Tollie Eth, NP  Admit date: 03/09/2023 Discharge date: 03/14/2023  Admitted From: Home Disposition: Home  Recommendations for Outpatient Follow-up:  Follow up with PCP in 1-2 weeks Outpatient follow-up with medical oncology, Dr. Grayling Congress as scheduled Outpatient follow-up with interventional radiology for further drain management Outpatient follow-up with [facility] Continue Augmentin to complete 14-day antibiotic course for acute cholecystitis Continue to flush biliary drain with 5 mL normal saline once daily  Home Health: No Equipment/Devices: PICC line, cholecystostomy drain  Discharge Condition: Stable CODE STATUS: Full code Diet recommendation: Regular diet  History of present illness:  April [name] is a 48 y.o. 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 Meridee Score 02/26/2023), portal vein thrombus,, anemia of chronic medical disease, essential hypertension who presented to [facility] ED on 03/09/2023 from home via EMS with right right-sided abdominal pain associated with nausea and vomiting.   In the ED, temperature 101.3 F, HR 125, RR 22, BP 134/102.  SpO2 97% on room air.  WBC 16.5, hemoglobin 12.1, platelet count 387.  Sodium 137, potassium 2.7, chloride 96, CO2 28, glucose 110, BUN 6, creatinine 0.43.  INR 1.1.  Influenza A/B/RSV/COVID PCR negative.  Urinalysis with moderate hemoglobin otherwise unremarkable.  Chest x-ray with no acute cardiopulmonary disease process.  CT abdomen/pelvis with contrast with noted rectal carcinoma with peritoneal carcinomatosis, nodular thickening in the liver hilum slightly increased from previous, indwelling biliary stents with some improving  biliary ductal dilation, heterogeneous appearance of the liver with more periportal edema and areas of potential developing portal vein peripheral branch thrombus, atrophy of the right kidney, bilateral nonobstructing renal stones.  Blood cultures x 2 obtained.  Patient received hydromorphone 1 mg IVP x 2, LR 1000 mL bolus, ondansetron 4 mg IVP, KCl 10 mEq IVPB and Zosyn 3.375 g IVPB.  Received additional normal saline 1000 mL liter bolus, prochlorperazine 10 mg IVP, ketorolac 30 mg IVP and hydromorphone 2 mg IVP x 1.  Hydromorphone 2 mg IVP x 1.  The case was discussed with primary oncologist by Dr. Myrle Sheng;  no anticoagulation recommended.  TRH consulted for admission.  Hospital course:  Acute Cholecystitis Patient presenting to ED with progressive right-sided abdominal pain.  Patient noted to be febrile with WBC count elevated at 16.5.  Right upper quadrant ultrasound with no biliary dilation but common duct partially obscured, dilated gallbladder with sludge and questionable stone, significant wall thickening.  General surgery was consulted and recommended IR percutaneous drain placement given she is a poor candidate for surgical management given her metastatic cancer.  General surgery was consulted and followed during hospital course.  Patient underwent IR cholecystostomy tube placement on 03/11/2023.  Patient was started on empiric antibiotics with cefepime and metronidazole.  Patient's white blood cell count peaked at 27.2 during hospitalization and returned to normal with 9.5 at time of discharge.  Blood cultures showed no growth during hospitalization.  Gram stain from operative culture from cholecystostomy initially showing rare gram-negative rods but no growth to date.  Will continue Augmentin on discharge to complete a 14-day antibiotic course.  Continue to flush cholecystostomy tube with 5 mL normal saline once daily and monitor drain output.  Outpatient follow-up with IR 6 weeks for routine tube  evaluation.  Outpatient follow-up with general surgery as needed; although not  deemed a operative candidate due to her extensive metastasis.   Hypokalemia Repleted.     Hypomagnesemia Repleted.     Hypophosphatemia Repleted.     Stage IV sigmoid colon cancer with peritoneal carcinomatosis, liver metastasis Follow with medical oncology outpatient, Dr. Truett Perna.  Currently on salvage therapy with irinotecan/panitumumab  -- Medical oncology, palliative care following; appreciate assistance   Anemia of chronic medical disease Hemoglobin stable, 8.5 at time of discharge.   Essential Hypertension On metoprolol tartrate 50 mg p.o. twice daily and amlodipine 10 mg p.o. daily.  Discharge Diagnoses:  Principal Problem:   Hypokalemia Active Problems:   Hypertension   Prolonged QT interval   Metastatic colon cancer to liver (HCC)   Nausea and vomiting   SIRS (systemic inflammatory response syndrome) (HCC)   Abdominal pain, chronic, right upper quadrant   Portal vein thrombosis   Fever    Discharge Instructions  Discharge Instructions     Call MD for:  difficulty breathing, headache or visual disturbances   Complete by: As directed    Call MD for:  extreme fatigue   Complete by: As directed    Call MD for:  persistant dizziness or light-headedness   Complete by: As directed    Call MD for:  persistant nausea and vomiting   Complete by: As directed    Call MD for:  severe uncontrolled pain   Complete by: As directed    Call MD for:  temperature >100.4   Complete by: As directed    Continue PICC at discharge   Complete by: As directed    Flush PICC line per home health policy: Yes   Diet - low sodium heart healthy   Complete by: As directed    Increase activity slowly   Complete by: As directed    No wound care   Complete by: As directed       Allergies as of 03/14/2023       Reactions   Lisinopril Swelling, Other (See Comments)   Lip swelling and Angioedema    Irinotecan Other (See Comments)   Stomach cramping Patient given 0.5 mL atropine IV and was able to continue infusion.  See Progress note on 10/03/20.    Leucovorin Calcium Itching   Patient reported itchy palms towards end of transfusion, see progress note 08/04/22.   Oxaliplatin Itching   Patient reported itchy palms towards end of transfusion, see progress note on 08/04/22.        Medication List     STOP taking these medications    doxycycline 100 MG tablet Commonly known as: ADOXA   magic mouthwash (nystatin, diphenhydrAMINE, alum & mag hydroxide) suspension mixture   morphine 30 MG 12 hr tablet Commonly known as: MS CONTIN       TAKE these medications    amLODipine 10 MG tablet Commonly known as: NORVASC Take 1 tablet (10 mg total) by mouth daily.   amoxicillin-clavulanate 875-125 MG tablet Commonly known as: AUGMENTIN Take 1 tablet by mouth 2 (two) times daily for 8 days.   dexamethasone 4 MG tablet Commonly known as: DECADRON Take 1 tablet by mouth twice a day for 3 days beginning day 2 of chemo.   dicyclomine 20 MG tablet Commonly known as: BENTYL Take 1 tablet (20 mg total) by mouth 3 (three) times daily as needed for spasms (rectal/AB pain). What changed: when to take this   HYDROmorphone 2 MG tablet Commonly known as: DILAUDID Take 1 tablet (2 mg total) by mouth  every 4 (four) hours as needed for severe pain (pain score 7-10) or moderate pain (pain score 4-6). What changed:  when to take this reasons to take this   lactulose 10 GM/15ML solution Commonly known as: CHRONULAC Take 15-30 mLs by mouth daily as needed for mild constipation.   linaclotide 72 MCG capsule Commonly known as: LINZESS Take 72 mcg by mouth daily as needed (constipation).   metoprolol tartrate 50 MG tablet Commonly known as: LOPRESSOR Take 1 tablet (50 mg total) by mouth 2 (two) times daily.   ondansetron 8 MG tablet Commonly known as: ZOFRAN Take 1 tablet (8 mg total) by  mouth every 8 (eight) hours as needed for nausea or vomiting. Can start taking 72 hours after chemotherapy   pantoprazole 40 MG tablet Commonly known as: PROTONIX Take 1 tablet (40 mg total) by mouth daily before breakfast.   polyethylene glycol 17 g packet Commonly known as: MIRALAX / GLYCOLAX Take 17 g by mouth daily as needed. What changed: reasons to take this   potassium chloride 10 MEQ CR capsule Commonly known as: MICRO-K Take 2 capsules (20 mEq total) by mouth 2 (two) times daily. What changed: how much to take   prochlorperazine 10 MG tablet Commonly known as: COMPAZINE Take 1 tablet (10 mg total) by mouth every 6 (six) hours as needed for nausea or vomiting.   sodium chloride flush 0.9 % Soln injection 5 mLs by Intracatheter route daily at 6 (six) AM. Flush biliary drain with 5 mL normal saline once daily        Follow-up Information     Early, Sung Amabile, NP. Schedule an appointment as soon as possible for a visit in 1 week(s).   Specialty: Nurse Practitioner Contact information: [address] [place] [address] [phone]         [facility] Schedule an appointment as soon as possible for a visit.   Contact information: [address] [place] [address] [phone]         Ladene Artist, MD Follow up.   Specialty: Oncology Contact information: [address] [place] [address] [phone]         [facility] [place]. Schedule an appointment as soon as possible for a visit.   Specialty: General Surgery Contact information: [address] [place] [address] [phone]               Allergies  Allergen Reactions   Lisinopril Swelling and Other (See Comments)    Lip swelling and Angioedema   Irinotecan Other (See Comments)    Stomach cramping Patient given 0.5 mL atropine IV and was able to continue infusion.  See Progress note on  10/03/20.    Leucovorin Calcium Itching    Patient reported itchy palms towards end of transfusion, see progress note 08/04/22.    Oxaliplatin Itching    Patient reported itchy palms towards end of transfusion, see progress note on 08/04/22.    Consultations: Medical oncology General Surgery Interventional radiology Palliative care   Procedures/Studies: IR Perc Cholecystostomy Result Date: 03/11/2023 INDICATION: Acute cholecystitis EXAM: ULTRASOUND AND FLUOROSCOPIC-GUIDED CHOLECYSTOSTOMY TUBE PLACEMENT COMPARISON:  US Abdomen, 03/09/2023.  CT AP, 03/10/2023. MEDICATIONS: The patient is currently admitted to the hospital and on intravenous antibiotics. Antibiotics were administered within an appropriate time frame prior to skin puncture. 1 mg Dilaudid IV.  50 mg Benadryl IV.  25 mg Demerol IV ANESTHESIA/SEDATION: Moderate (conscious) sedation was employed during  this procedure. A total of Versed 2 mg and Fentanyl 100 mcg was administered intravenously. Moderate Sedation Time: 16 minutes. The patient's level of consciousness and vital signs were monitored continuously by radiology nursing throughout the procedure under my direct supervision. CONTRAST:  20mL OMNIPAQUE IOHEXOL 300 MG/ML SOLN - administered into the gallbladder fossa. FLUOROSCOPY TIME:  Fluoroscopic dose; 3 mGy COMPLICATIONS: None immediate. PROCEDURE: Informed written consent was obtained from the patient after a discussion of the risks, benefits and alternatives to treatment. Questions regarding the procedure were encouraged and answered. A timeout was performed prior to the initiation of the procedure. The right upper abdominal quadrant was prepped and draped in the usual sterile fashion, and a sterile drape was applied covering the operative field. Maximum barrier sterile technique with sterile gowns and gloves were used for the procedure. A timeout was performed prior to the initiation of the procedure. Local anesthesia was provided  with 1% lidocaine with epinephrine. Ultrasound scanning of the right upper quadrant demonstrates a markedly dilated gallbladder. Of note, the patient reported pain with ultrasound imaging over the gallbladder. Utilizing a transhepatic approach, a 22 gauge needle was advanced into the gallbladder under direct ultrasound guidance. An ultrasound image was saved for documentation purposes. Appropriate intraluminal puncture was confirmed with the efflux of bile and advancement of an 0.018 wire into the gallbladder lumen. The needle was exchanged for an Accustick set. A small amount of contrast was injected to confirm appropriate intraluminal positioning. Over a Benson wire, a 10 Fr cholecystomy tube was advanced into the gallbladder fossa, coiled and locked. Bile was aspirated and a small amount of contrast was injected as several post procedural spot radiographic images were obtained in various obliquities. The catheter was secured to the skin with suture, connected to a drainage bag and a dressing was placed. The patient tolerated the procedure well without immediate post procedural complication. IMPRESSION: Successful ultrasound and fluoroscopic guided placement of a 10 Fr cholecystostomy tube. 70 mL of serous bilious fluid was aspirated. A sample submitted for microbiological analysis. RECOMMENDATIONS: The patient will return to [facility]iology (VIR) for routine drainage catheter evaluation and exchange in 6-8 weeks. Roanna Banning, MD [facility] Electronically Signed   By: Roanna Banning M.D.   On: 03/11/2023 17:54   US Abdomen Limited RUQ (LIVER/GB) Result Date: 03/10/2023 CLINICAL DATA:  Right upper quadrant pain for 2 days. EXAM: ULTRASOUND ABDOMEN LIMITED RIGHT UPPER QUADRANT COMPARISON:  CT abdomen pelvis 03/09/2023 FINDINGS: Gallbladder: Dilated gallbladder with wall thickening measuring up to 7 mm. There is also sludge. Questionable  stone. Common bile duct: Diameter: 2 mm.  Obscured by the known indwelling biliary stents. Liver: Homogeneous hepatic parenchyma. Tiny benign-appearing cyst in left hepatic lobe measuring 9 mm. This was seen previously. Portal vein is patent on color Doppler imaging with normal direction of blood flow towards the liver. Other: Trace ascites. IMPRESSION: No biliary dilatation but the common duct is partially obscured by the known biliary stents. Dilated gallbladder with sludge and questionable stone. There is also significant wall thickening. Please correlate for any clinical evidence of acute cholecystitis although with the patient's history there is a differential. Further workup such as HIDA scan as clinically appropriate. Mild ascites. Please correlate with prior CT Electronically Signed   By: Karen Kays M.D.   On: 03/10/2023 19:41   CT ABDOMEN PELVIS W CONTRAST Result Date: 03/09/2023 CLINICAL DATA:  Rectal cancer with liver metastases with abdominal pain. * Tracking Code: BO *  EXAM: CT ABDOMEN AND PELVIS WITH CONTRAST TECHNIQUE: Multidetector CT imaging of the abdomen and pelvis was performed using the standard protocol following bolus administration of intravenous contrast. RADIATION DOSE REDUCTION: This exam was performed according to the departmental dose-optimization program which includes automated exposure control, adjustment of the mA and/or kV according to patient size and/or use of iterative reconstruction technique. CONTRAST:  OMNIPAQUE IOHEXOL 300 MG/ML  SOLN COMPARISON:  CT 02/24/2023. FINDINGS: Lower chest: There is some linear opacity lung bases likely scar or atelectasis. No pleural effusion. Hepatobiliary: There is areas of fatty liver infiltration particularly geographically along the right hepatic lobe more so than left. This is progressive from previous examination. Portal vein is patent grossly but somewhat narrowed by extrinsic compression. There are indwelling endoscopically  placed stents. Level of intrahepatic biliary duct dilatation is slightly improving on the left and some on the right. The gallbladder continues to be dilated with some wall edema which is increasing. There is more nodular tissue extending up along the porta hepatis as best seen on coronal series 8, image 49. This has a differential including progression of disease. There are some areas of progressive portal vein occlusion such is anteriorly in the right hepatic lobe inferiorly best seen on coronal imaging. Pancreas: No pancreatic mass. Persistent pancreas ductal dilatation, similar to previous. Spleen: Normal in size without focal abnormality. Adrenals/Urinary Tract: Adrenal glands are preserved. Bilateral nonobstructing renal stones. No enhancing mass or collecting system dilatation. Preserved contour to the urinary bladder. Areas of atrophy in the right kidney are again noted. Stomach/Bowel: On this non oral contrast exam large bowel has a normal course and caliber with scattered stool. There are loops of decompressed transverse colon. The small bowel is nondilated. Along the rectum once again is nodular wall thickening with extension into the adjacent tissue consistent with known history of neoplasm. Nodular areas are seen extending perirectal and presacral spaces. Vascular/Lymphatic: Normal caliber aorta and IVC. There is ectasia of the left common iliac artery with diameter approaching 19 mm. Scattered atherosclerotic calcified plaque. Several small less than 1 cm size in short axis retroperitoneal nodes are seen, not pathologic by size criteria. Reproductive: Uterus is present. Other: Peritoneal carcinomatosis again identified. Example measured previously anterior in the central upper abdomen with transverse dimension of 2.2 cm. Today this is measured 2.5 cm. Other areas are also similar including the pericolic gutters. There is also thickening along the surface of the liver towards the dome on the right side  and laterally. Some these areas appear more prominent today than the recent prior this could be technical. Musculoskeletal: Curvature of the spine with some degenerative changes. 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 IMPRESSION: Again note is made of rectal carcinoma with peritoneal carcinomatosis. Trace ascites. The level of nodular thickening in the liver hilum appears slightly increased from previous. Indwelling biliary stents with some proving biliary ductal dilatation compared to the prior examination. However there is a more heterogeneous appearance of the liver with more periportal edema and areas of potential developing portal vein peripheral branch thrombus particularly along the inferior aspect of the liver. Bilateral nonobstructing renal stones.  Atrophy of the right kidney. Critical Value/emergent results were called by telephone at the time of interpretation on 03/09/2023 at 11:28 am to provider [facility] , who verbally acknowledged these results. Electronically Signed   By: Karen Kays M.D.   On: 03/09/2023 11:49   DG Chest Port 1 View Result Date: 03/09/2023 CLINICAL DATA:  48 year old female  with possible sepsis. Metastatic rectal cancer. EXAM: PORTABLE CHEST 1 VIEW COMPARISON:  CT Chest, Abdomen, and Pelvis 01/24/2023 and earlier. FINDINGS: Portable AP semi upright view at 0949 hours. Lung volumes and mediastinal contours remain normal. Left side PICC line remains in place. Visualized tracheal air column is within normal limits. Allowing for portable technique the lungs are clear. No pneumothorax or pleural effusion. No evidence of pneumoperitoneum. Negative visible bowel gas. No acute osseous abnormality identified. IMPRESSION: No acute cardiopulmonary abnormality. Electronically Signed   By: Odessa Fleming M.D.   On: 03/09/2023 10:23   DG ERCP Result Date: 02/27/2023 CLINICAL DATA:  Encounter for removal of biliary stent. EXAM: ERCP TECHNIQUE: Multiple spot images obtained with  the fluoroscopic device and submitted for interpretation post-procedure. FLUOROSCOPY: Radiation Exposure Index (as provided by the fluoroscopic device): 106.44 mGy Kerma COMPARISON:  CT 02/24/2023 FINDINGS: Initial image demonstrates 2 plastic biliary stents. Biliary stents removed. Retrograde cholangiogram was performed. Cholangiogram images demonstrate narrowing and irregularity near the biliary confluence and involving the left and right hepatic ducts. Wires were advanced into the biliary ducts. Evidence for balloon dilatation of the biliary ducts. Final images demonstrate placement of 2 new plastic biliary stents presumably in the left and right hepatic ducts. IMPRESSION: 1. 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 These areas were dilated and new biliary stents were placed. These images were submitted for radiologic interpretation only. Please see the procedural report. Electronically Signed   By: Richarda Overlie M.D.   On: 02/27/2023 09:03   CT ABDOMEN PELVIS W CONTRAST Result Date: 02/24/2023 CLINICAL DATA:  Abdominal pain EXAM: CT ABDOMEN AND PELVIS WITH CONTRAST TECHNIQUE: Multidetector CT imaging of the abdomen and pelvis was performed using the standard protocol following bolus administration of intravenous contrast. RADIATION DOSE REDUCTION: This exam was performed according to the departmental dose-optimization program which includes automated exposure control, adjustment of the mA and/or kV according to patient size and/or use of iterative reconstruction technique. CONTRAST:  75mL OMNIPAQUE IOHEXOL 300 MG/ML  SOLN COMPARISON:  01/24/2023 FINDINGS: Lower chest: No acute abnormality. Hepatobiliary: Redemonstration of subtly hypoattenuating lesion within the peripheral aspect of hepatic segment 2 (series 2, image 20). Additional scattered subcentimeter low-density lesions within the liver remain too small to characterize. Prominent intrahepatic  biliary dilatation with biliary stents in place. Slightly hyperattenuating material fills the gallbladder. No pericholecystic inflammatory changes by CT. Pancreas: Stable mild pancreatic ductal dilatation. No inflammatory changes. Spleen: Normal in size without focal abnormality. Adrenals/Urinary Tract: Unremarkable adrenal glands. Stable bilateral nephrolithiasis with stones measuring up to 1.1 cm on the left and 0.9 cm on the right. No hydronephrosis. Urinary bladder within normal limits for the degree of distension. Stomach/Bowel: Stomach is within normal limits. Appendix appears normal. Similar degree of wall thickening of the rectosigmoid colon. No new sites of bowel wall thickening or inflammation. Vascular/Lymphatic: Scattered aortoiliac atherosclerotic calcifications without aneurysm. No abdominopelvic lymphadenopathy. Reproductive: Uterus and bilateral adnexa are unremarkable. Other: Similar degree of nodularity along the omentum and left pericolic gutter. No ascites or pneumoperitoneum. Musculoskeletal: No lytic or sclerotic bone lesion. Grade 2 anterolisthesis of L5 on S1 secondary to chronic bilateral L5 pars interarticularis defects. IMPRESSION: 1. No new or acute abdominopelvic findings. 2. 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 3. Prominent intrahepatic biliary dilatation with biliary stents in place. 4. Stable bilateral nephrolithiasis. 5. Aortic atherosclerosis (ICD10-I70.0). Electronically Signed   By: Duanne Guess D.O.   On:  02/24/2023 18:49     Subjective: Patient seen examined bedside, sitting edge of bed.  IV team RN present at bedside.  No specific complaints this morning, pain much better controlled.  Reports less output from cholecystostomy tube.  Ready for discharge home.  Has outpatient follow-up with Dr. Grayling Congress coming up next week.  Denies headache, no dizziness, no chest pain, no palpitations, no  shortness of breath, no abdominal pain, no fever/chills/night sweats, no nausea/vomiting/diarrhea, no focal weakness, no fatigue, no paresthesia.  No acute events overnight per nursing staff.  Discharge Exam: Vitals:   03/14/23 0039 03/14/23 0455  BP: (!) 149/114 (!) 139/106  Pulse: 83 89  Resp:  16  Temp:  97.7 F (36.5 C)  SpO2:  100%   Vitals:   03/13/23 2141 03/13/23 2348 03/14/23 0039 03/14/23 0455  BP: (!) 156/118 (!) 154/117 (!) 149/114 (!) 139/106  Pulse: (!) 107 91 83 89  Resp: 18   16  Temp: 97.8 F (36.6 C)   97.7 F (36.5 C)  TempSrc: Oral   Oral  SpO2: 100%   100%  Weight:      Height:        Physical Exam: GEN: NAD, alert and oriented x 3, chronically ill in appearance HEENT: NCAT, PERRL, EOMI, sclera clear, dry mucous membranes PULM: CTAB w/o wheezes/crackles, normal respiratory effort, on room air CV: RRR w/o M/G/R GI: abd soft, nondistended, mild TTP surrounding cholecystostomy drain site, + BS MSK: no peripheral edema, moves all extremities independently NEURO: No focal neurological deficits PSYCH: normal mood/affect Integumentary: No concerning rashes/lesions/wounds noted on exposed skin surfaces      The results of significant diagnostics from this hospitalization (including imaging, microbiology, ancillary and laboratory) are listed below for reference.     Microbiology: Recent Results (from the past 240 hours)  Blood Culture (routine x 2)     Status: None   Collection Time: 03/09/23  9:43 AM   Specimen: BLOOD  Result Value Ref Range Status   Specimen Description   Final    BLOOD RIGHT ANTECUBITAL Performed at [facility], [address] [place], [place] [address]    Special Requests   Final    BOTTLES DRAWN AEROBIC AND ANAEROBIC Blood Culture adequate volume Performed at [facility], [address] [place], [place] [address]    Culture   Final    NO GROWTH 5 DAYS Performed at [facility], [address] [place], [place] [address]    Report Status 03/14/2023 FINAL  Final  Resp panel by RT-PCR (RSV, Flu A&B, Covid) Anterior Nasal Swab     Status: None   Collection Time: 03/09/23  9:44 AM   Specimen: Anterior Nasal Swab  Result Value Ref Range Status   SARS Coronavirus 2 by RT PCR NEGATIVE NEGATIVE Final    Comment: (NOTE) SARS-CoV-2 target nucleic acids are NOT DETECTED.  The SARS-CoV-2 RNA is generally detectable in upper respiratory specimens during the acute phase of infection. The lowest concentration of SARS-CoV-2 viral copies this assay can detect is 138 copies/mL. A negative result does not preclude SARS-Cov-2 infection and should not be used as the sole basis for treatment or other patient management decisions. A negative result may occur with  improper specimen collection/handling, submission of specimen other than nasopharyngeal swab, presence of viral mutation(s) within the areas targeted by this assay, and inadequate number of viral copies(<138 copies/mL). A negative result must be combined with clinical observations, patient history,  and epidemiological information. The expected result is Negative.  Fact Sheet for Patients:  BloggerCourse.com  Fact Sheet for Healthcare Providers:  SeriousBroker.it  This test is no t yet approved or cleared by the [place] FDA and  has been authorized for detection and/or diagnosis of SARS-CoV-2 by FDA under an Emergency Use Authorization (EUA). This EUA will remain  in effect (meaning this test can be used) for the duration of the COVID-19 declaration under Section 564(b)(1) of the Act, 21 U.S.C.section 360bbb-3(b)(1), unless the authorization is terminated  or revoked sooner.       Influenza A by PCR NEGATIVE NEGATIVE Final   Influenza B by PCR NEGATIVE NEGATIVE Final    Comment: (NOTE) The Xpert Xpress SARS-CoV-2/FLU/RSV plus assay is intended as an aid in  the diagnosis of influenza from Nasopharyngeal swab specimens and should not be used as a sole basis for treatment. Nasal washings and aspirates are unacceptable for Xpert Xpress SARS-CoV-2/FLU/RSV testing.  Fact Sheet for Patients: BloggerCourse.com  Fact Sheet for Healthcare Providers: SeriousBroker.it  This test is not yet approved or cleared by the [place] FDA and has been authorized for detection and/or diagnosis of SARS-CoV-2 by FDA under an Emergency Use Authorization (EUA). This EUA will remain in effect (meaning this test can be used) for the duration of the COVID-19 declaration under Section 564(b)(1) of the Act, 21 U.S.C. section 360bbb-3(b)(1), unless the authorization is terminated or revoked.     Resp Syncytial Virus by PCR NEGATIVE NEGATIVE Final    Comment: (NOTE) Fact Sheet for Patients: BloggerCourse.com  Fact Sheet for Healthcare Providers: SeriousBroker.it  This test is not yet approved or cleared by the [place] FDA and has been authorized for detection and/or diagnosis of SARS-CoV-2 by FDA under an Emergency Use Authorization (EUA). This EUA will remain in effect (meaning this test can be used) for the duration of the COVID-19 declaration under Section 564(b)(1) of the Act, 21 U.S.C. section 360bbb-3(b)(1), unless the authorization is terminated or revoked.  Performed at [facility], [address], [place], [place] [address]   Blood Culture (routine x 2)     Status: None   Collection Time: 03/09/23 11:48 AM   Specimen: BLOOD RIGHT HAND  Result Value Ref Range Status   Specimen Description   Final    BLOOD RIGHT HAND Performed at [facility], [address], [place], [place] [address]    Special Requests   Final    BOTTLES DRAWN AEROBIC AND ANAEROBIC Blood Culture adequate volume Performed at [facility], [address], [place], [place] [address]    Culture   Final    NO GROWTH 5 DAYS Performed at [facility], [address] [place], [place] [address]    Report Status 03/14/2023 FINAL  Final  Aerobic/Anaerobic Culture w Gram Stain (surgical/deep wound)     Status: None (Preliminary result)   Collection Time: 03/11/23  2:04 PM   Specimen: BILE  Result Value Ref Range Status   Specimen Description   Final    BILE Performed at [facility], [address] [place], [place] [address]    Special Requests   Final    NONE Performed at [facility], [address], [place], [place] [address]    Gram Stain   Final    FEW WBC PRESENT,BOTH PMN AND MONONUCLEAR RARE GRAM NEGATIVE RODS    Culture   Final    HOLDING  FOR POSSIBLE ANAEROBE Performed at [facility], [address] [place], [place] [address]    Report Status PENDING  Incomplete     Labs: BNP (last 3 results) No results for input(s): "BNP" in the last 8760 hours. Basic Metabolic Panel: Recent Labs  Lab 03/09/23 0944 03/10/23 0348 03/11/23 0356 03/12/23 0147 03/13/23 0200  NA 137 136 134* 133* 137  K 2.7* 3.6 3.1* 3.3* 3.8  CL 96* 102 100 98 101  CO2 28 28 27 27 28   GLUCOSE 110* 94 104* 107* 74  BUN 6 9 7 6  <5*  CREATININE 0.43* 0.48 0.30* 0.43* 0.36*  CALCIUM 9.4 8.5* 8.2* 7.8* 8.3*  MG 1.7  --  1.6* 1.8 1.7  PHOS 4.1  --  2.3* 2.4* 2.5   Liver Function Tests: Recent Labs  Lab 03/09/23 0944 03/10/23 0348 03/11/23 0356 03/12/23 0147 03/13/23 0200  AST 25 29 19 15  12*  ALT 32 29 23 19 14   ALKPHOS 216* 191* 177* 161* 153*  BILITOT 0.6 1.1 0.5 0.4 0.3  PROT 8.0 6.7 6.3* 6.1* 5.8*  ALBUMIN 3.6 2.8* 2.6* 2.4* 2.4*   No results for input(s): "LIPASE", "AMYLASE" in the last 168 hours. No results for input(s): "AMMONIA" in the last 168 hours. CBC: Recent Labs  Lab 03/09/23 0944 03/10/23 0348 03/11/23 0356 03/12/23 0147  03/13/23 0200  WBC 16.5* 27.2* 26.4* 12.6* 9.4  NEUTROABS 12.9* 23.1* 21.7* 8.1*  --   HGB 12.1 9.8* 8.9* 8.5* 8.6*  HCT 37.7 30.3* 28.2* 26.0* 28.1*  MCV 94.3 95.3 97.6 94.5 96.9  PLT 387 292 250 296 329   Cardiac Enzymes: No results for input(s): "CKTOTAL", "CKMB", "CKMBINDEX", "TROPONINI" in the last 168 hours. BNP: Invalid input(s): "POCBNP" CBG: No results for input(s): "GLUCAP" in the last 168 hours. D-Dimer No results for input(s): "DDIMER" in the last 72 hours. Hgb A1c No results for input(s): "HGBA1C" in the last 72 hours. Lipid Profile No results for input(s): "CHOL", "HDL", "LDLCALC", "TRIG", "CHOLHDL", "LDLDIRECT" in the last 72 hours. Thyroid function studies No results for input(s): "TSH", "T4TOTAL", "T3FREE", "THYROIDAB" in the last 72 hours.  Invalid input(s): "FREET3" Anemia work up No results for input(s): "VITAMINB12", "FOLATE", "FERRITIN", "TIBC", "IRON", "RETICCTPCT" in the last 72 hours. Urinalysis    Component Value Date/Time   COLORURINE YELLOW 03/09/2023 1626   APPEARANCEUR CLEAR 03/09/2023 1626   LABSPEC RESULTS UNAVAILABLE DUE TO INTERFERING SUBSTANCE 03/09/2023 1626   PHURINE 5.0 03/09/2023 1626   GLUCOSEU NEGATIVE 03/09/2023 1626   HGBUR MODERATE (A) 03/09/2023 1626   BILIRUBINUR NEGATIVE 03/09/2023 1626   KETONESUR NEGATIVE 03/09/2023 1626   PROTEINUR NEGATIVE 03/09/2023 1626   NITRITE NEGATIVE 03/09/2023 1626   LEUKOCYTESUR NEGATIVE 03/09/2023 1626   Sepsis Labs Recent Labs  Lab 03/10/23 0348 03/11/23 0356 03/12/23 0147 03/13/23 0200  WBC 27.2* 26.4* 12.6* 9.4   Microbiology Recent Results (from the past 240 hours)  Blood Culture (routine x 2)     Status: None   Collection Time: 03/09/23  9:43 AM   Specimen: BLOOD  Result Value Ref Range Status   Specimen Description   Final    BLOOD RIGHT ANTECUBITAL Performed at [facility], [address] [place], [place] [address]    Special Requests   Final     BOTTLES DRAWN AEROBIC AND ANAEROBIC Blood Culture adequate volume Performed at [facility], [address], [place], [place] [address]    Culture   Final    NO  GROWTH 5 DAYS Performed at [facility], [address] [place], [place] [address]    Report Status 03/14/2023 FINAL  Final  Resp panel by RT-PCR (RSV, Flu A&B, Covid) Anterior Nasal Swab     Status: None   Collection Time: 03/09/23  9:44 AM   Specimen: Anterior Nasal Swab  Result Value Ref Range Status   SARS Coronavirus 2 by RT PCR NEGATIVE NEGATIVE Final    Comment: (NOTE) SARS-CoV-2 target nucleic acids are NOT DETECTED.  The SARS-CoV-2 RNA is generally detectable in upper respiratory specimens during the acute phase of infection. The lowest concentration of SARS-CoV-2 viral copies this assay can detect is 138 copies/mL. A negative result does not preclude SARS-Cov-2 infection and should not be used as the sole basis for treatment or other patient management decisions. A negative result may occur with  improper specimen collection/handling, submission of specimen other than nasopharyngeal swab, presence of viral mutation(s) within the areas targeted by this assay, and inadequate number of viral copies(<138 copies/mL). A negative result must be combined with clinical observations, patient history, and epidemiological information. The expected result is Negative.  Fact Sheet for Patients:  BloggerCourse.com  Fact Sheet for Healthcare Providers:  SeriousBroker.it  This test is no t yet approved or cleared by the [place] FDA and  has been authorized for detection and/or diagnosis of SARS-CoV-2 by FDA under an Emergency Use Authorization (EUA). This EUA will remain  in effect (meaning this test can be used) for the duration of the COVID-19 declaration under Section 564(b)(1) of the Act, 21 U.S.C.section 360bbb-3(b)(1), unless the  authorization is terminated  or revoked sooner.       Influenza A by PCR NEGATIVE NEGATIVE Final   Influenza B by PCR NEGATIVE NEGATIVE Final    Comment: (NOTE) The Xpert Xpress SARS-CoV-2/FLU/RSV plus assay is intended as an aid in the diagnosis of influenza from Nasopharyngeal swab specimens and should not be used as a sole basis for treatment. Nasal washings and aspirates are unacceptable for Xpert Xpress SARS-CoV-2/FLU/RSV testing.  Fact Sheet for Patients: BloggerCourse.com  Fact Sheet for Healthcare Providers: SeriousBroker.it  This test is not yet approved or cleared by the [place] FDA and has been authorized for detection and/or diagnosis of SARS-CoV-2 by FDA under an Emergency Use Authorization (EUA). This EUA will remain in effect (meaning this test can be used) for the duration of the COVID-19 declaration under Section 564(b)(1) of the Act, 21 U.S.C. section 360bbb-3(b)(1), unless the authorization is terminated or revoked.     Resp Syncytial Virus by PCR NEGATIVE NEGATIVE Final    Comment: (NOTE) Fact Sheet for Patients: BloggerCourse.com  Fact Sheet for Healthcare Providers: SeriousBroker.it  This test is not yet approved or cleared by the [place] FDA and has been authorized for detection and/or diagnosis of SARS-CoV-2 by FDA under an Emergency Use Authorization (EUA). This EUA will remain in effect (meaning this test can be used) for the duration of the COVID-19 declaration under Section 564(b)(1) of the Act, 21 U.S.C. section 360bbb-3(b)(1), unless the authorization is terminated or revoked.  Performed at [facility], [address] [place], [place] [address]   Blood Culture (routine x 2)     Status: None   Collection Time: 03/09/23 11:48 AM   Specimen: BLOOD RIGHT HAND  Result Value Ref Range Status   Specimen  Description   Final    BLOOD RIGHT HAND Performed at [facility], [address]  [place], [place] [address]    Special Requests   Final    BOTTLES DRAWN AEROBIC AND ANAEROBIC Blood Culture adequate volume Performed at [facility], [address] [place], [place] [address]    Culture   Final    NO GROWTH 5 DAYS Performed at [facility], [address] [place], [place] [address]    Report Status 03/14/2023 FINAL  Final  Aerobic/Anaerobic Culture w Gram Stain (surgical/deep wound)     Status: None (Preliminary result)   Collection Time: 03/11/23  2:04 PM   Specimen: BILE  Result Value Ref Range Status   Specimen Description   Final    BILE Performed at [facility], [address] [place], [place] [address]    Special Requests   Final    NONE Performed at [facility], [address], [place], [place] [address]    Gram Stain   Final    FEW WBC PRESENT,BOTH PMN AND MONONUCLEAR RARE GRAM NEGATIVE RODS    Culture   Final    HOLDING FOR POSSIBLE ANAEROBE Performed at [facility], [address], [place], [place] [address]    Report Status PENDING  Incomplete     Time coordinating discharge: Over 30 minutes  SIGNED:   Alvira Philips [place], DO  [facility] 03/14/2023, 10:21 AM

## 2023-03-14 NOTE — Plan of Care (Signed)
   Problem: Fluid Volume: Goal: Hemodynamic stability will improve Outcome: Progressing   Problem: Clinical Measurements: Goal: Diagnostic test results will improve Outcome: Progressing Goal: Signs and symptoms of infection will decrease Outcome: Progressing   Problem: Respiratory: Goal: Ability to maintain adequate ventilation will improve Outcome: Progressing

## 2023-03-15 ENCOUNTER — Other Ambulatory Visit (HOSPITAL_COMMUNITY): Payer: Self-pay

## 2023-03-16 ENCOUNTER — Telehealth: Payer: Self-pay

## 2023-03-16 NOTE — Transitions of Care (Post Inpatient/ED Visit) (Signed)
   03/16/2023  Name: April [name] MRN: [id number] DOB: 25-Mar-1975  Today's TOC FU Call Status: Today's TOC FU Call Status:: Unsuccessful Call (1st Attempt) Unsuccessful Call (1st Attempt) Date: 03/16/23  Attempted to reach the patient regarding the most recent Inpatient/ED visit.  Follow Up Plan: Additional outreach attempts will be made to reach the patient to complete the Transitions of Care (Post Inpatient/ED visit) call.   Deidre Ala, BSN, RN [place]  [facility] - [facility] RN Care Manager [phone]

## 2023-03-17 ENCOUNTER — Inpatient Hospital Stay: Payer: 59

## 2023-03-17 ENCOUNTER — Ambulatory Visit: Payer: 59 | Admitting: Nurse Practitioner

## 2023-03-17 ENCOUNTER — Other Ambulatory Visit: Payer: 59

## 2023-03-17 ENCOUNTER — Other Ambulatory Visit: Payer: Self-pay | Admitting: General Surgery

## 2023-03-17 ENCOUNTER — Telehealth: Payer: Self-pay

## 2023-03-17 ENCOUNTER — Inpatient Hospital Stay (HOSPITAL_BASED_OUTPATIENT_CLINIC_OR_DEPARTMENT_OTHER): Admitting: Nurse Practitioner

## 2023-03-17 ENCOUNTER — Inpatient Hospital Stay: Attending: Oncology

## 2023-03-17 ENCOUNTER — Encounter: Payer: Self-pay | Admitting: Nurse Practitioner

## 2023-03-17 ENCOUNTER — Ambulatory Visit: Payer: 59

## 2023-03-17 VITALS — BP 138/89 | HR 100 | Temp 97.9°F | Resp 18 | Ht 64.0 in | Wt 136.5 lb

## 2023-03-17 DIAGNOSIS — Z79899 Other long term (current) drug therapy: Secondary | ICD-10-CM | POA: Insufficient documentation

## 2023-03-17 DIAGNOSIS — R109 Unspecified abdominal pain: Secondary | ICD-10-CM | POA: Insufficient documentation

## 2023-03-17 DIAGNOSIS — K810 Acute cholecystitis: Secondary | ICD-10-CM

## 2023-03-17 DIAGNOSIS — C187 Malignant neoplasm of sigmoid colon: Secondary | ICD-10-CM | POA: Insufficient documentation

## 2023-03-17 DIAGNOSIS — C20 Malignant neoplasm of rectum: Secondary | ICD-10-CM

## 2023-03-17 DIAGNOSIS — Z5112 Encounter for antineoplastic immunotherapy: Secondary | ICD-10-CM | POA: Insufficient documentation

## 2023-03-17 DIAGNOSIS — C19 Malignant neoplasm of rectosigmoid junction: Secondary | ICD-10-CM

## 2023-03-17 DIAGNOSIS — C786 Secondary malignant neoplasm of retroperitoneum and peritoneum: Secondary | ICD-10-CM | POA: Insufficient documentation

## 2023-03-17 DIAGNOSIS — C7989 Secondary malignant neoplasm of other specified sites: Secondary | ICD-10-CM | POA: Diagnosis not present

## 2023-03-17 DIAGNOSIS — Z95828 Presence of other vascular implants and grafts: Secondary | ICD-10-CM

## 2023-03-17 DIAGNOSIS — Z79634 Long term (current) use of topoisomerase inhibitor: Secondary | ICD-10-CM | POA: Diagnosis not present

## 2023-03-17 DIAGNOSIS — Z87442 Personal history of urinary calculi: Secondary | ICD-10-CM | POA: Diagnosis not present

## 2023-03-17 DIAGNOSIS — I10 Essential (primary) hypertension: Secondary | ICD-10-CM | POA: Insufficient documentation

## 2023-03-17 DIAGNOSIS — R112 Nausea with vomiting, unspecified: Secondary | ICD-10-CM | POA: Diagnosis not present

## 2023-03-17 DIAGNOSIS — Z5111 Encounter for antineoplastic chemotherapy: Secondary | ICD-10-CM | POA: Insufficient documentation

## 2023-03-17 MED ORDER — HEPARIN SOD (PORK) LOCK FLUSH 100 UNIT/ML IV SOLN
500.0000 [IU] | Freq: Once | INTRAVENOUS | Status: AC
  Administered 2023-03-17: 500 [IU]

## 2023-03-17 MED ORDER — SODIUM CHLORIDE 0.9% FLUSH
10.0000 mL | Freq: Once | INTRAVENOUS | Status: AC
  Administered 2023-03-17: 10 mL

## 2023-03-17 NOTE — Progress Notes (Signed)
 Patients presents for PICC line dressing change> Dressing changed PICC flushed without difficulty.  Good blood return noted with no bruising or swelling noted at site.  Patient to see Lonna Cobb NP today.

## 2023-03-17 NOTE — Transitions of Care (Post Inpatient/ED Visit) (Signed)
 03/17/2023  Name: April [name] MRN: [id number] DOB: [birth year]  Today's TOC FU Call Status: Today's TOC FU Call Status:: Successful TOC FU Call Completed TOC FU Call Complete Date: 03/17/23 Patient's Name and Date of Birth confirmed.  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 How have you been since you were released from the hospital?: Better Any questions or concerns?: No  Items Reviewed: Did you receive and understand the discharge instructions provided?: Yes Medications obtained,verified, and reconciled?: Yes (Medications Reviewed) Any new allergies since your discharge?: No Dietary orders reviewed?: NA Do you have support at home?: Yes People in Home: child(ren), adult, parent(s) Name of Support/Comfort Primary Source: Malissa Hippo, mother  Medications Reviewed Today: Medications Reviewed Today     Reviewed by Redge Gainer, RN (Case Manager) on 03/17/23 at 1612  Med List Status: <None>   Medication Order Taking? Sig Documenting Provider Last Dose Status Informant  amLODipine (NORVASC) 10 MG tablet [id number] No Take 1 tablet (10 mg total) by mouth daily. Rodolph Bong, MD Taking Active Self, Pharmacy Records           Med Note (CRUTHIS, Carlota Raspberry Mar 09, 2023  1:56 PM) Pt is adamant she is still taking this medication daily. Dispense report does not support this claim.   amoxicillin-clavulanate (AUGMENTIN) 875-125 MG tablet [id number] No Take 1 tablet by mouth 2 (two) times daily for 8 days. [place], Alvira Philips, DO Taking Active   dexamethasone (DECADRON) 4 MG tablet [id number] No Take 1 tablet by mouth twice a day for 3 days beginning day 2 of chemo.  Patient not taking: Reported on 03/17/2023   Rana Snare, NP Not Taking Active Self, Pharmacy Records            Med Note (CRUTHIS, Carlota Raspberry Mar 09, 2023  1:56 PM) Last dose was 3 weeks ago per pt.   dicyclomine (BENTYL) 20 MG tablet [id number] No Take 1 tablet (20 mg total) by mouth 3 (three) times daily as needed for spasms (rectal/AB pain).  Patient taking differently: Take 20 mg by mouth 2 (two) times daily as needed for spasms (rectal/AB pain).   Ladene Artist, MD Taking Active Self, Pharmacy Records  HYDROmorphone (DILAUDID) 2 MG tablet [id number] No Take 1 tablet (2 mg total) by mouth every 4 (four) hours as needed for severe pain (pain score 7-10) or moderate pain (pain score 4-6). [place], Alvira Philips, DO Taking Active   lactulose (CHRONULAC) 10 GM/15ML solution [id number] No Take 15-30 mLs by mouth daily as needed for mild constipation. [provider] Taking Active Pharmacy Records, Self           Med Note (CRUTHIS, CHLOE C   Mon Mar 09, 2023  1:57 PM) Pt is unsure of last dose.   linaclotide (LINZESS) 72 MCG capsule [id number] No Take 72 mcg by mouth daily as needed (constipation).  Patient not taking: Reported on 03/17/2023   [provider] Not Taking Active Self, Pharmacy Records  metoprolol tartrate (LOPRESSOR) 50 MG tablet [id number] No Take 1 tablet (50 mg total) by mouth 2 (two) times daily. Rodolph Bong, MD Taking Active Self, Pharmacy Records  ondansetron [facility]) 8 MG tablet [id number] No Take 1 tablet (8 mg total) by mouth every 8 (eight) hours as needed for nausea or vomiting. Can start taking 72 hours  after chemotherapy  Patient not taking: Reported on 03/09/2023   Rana Snare, NP Not Taking Active Self, Pharmacy Records           Med Note (CRUTHIS, CHLOE C   Mon Mar 09, 2023  1:52 PM)    pantoprazole (PROTONIX) 40 MG tablet [id number] No Take 1 tablet (40 mg total) by mouth daily before breakfast. Meredeth Ide, MD Taking Active Self, Pharmacy Records  polyethylene glycol (MIRALAX / GLYCOLAX) 17 g packet [id number] No Take 17 g by mouth daily as needed.  Patient  taking differently: Take 17 g by mouth daily as needed for moderate constipation or mild constipation.   Meredeth Ide, MD Taking Active Self, Pharmacy Records           Med Note (CRUTHIS, Carlota Raspberry Mar 09, 2023  1:57 PM) Pt is unsure of last dose.   potassium chloride (MICRO-K) 10 MEQ CR capsule [id number] No Take 2 capsules (20 mEq total) by mouth 2 (two) times daily.  Patient taking differently: Take 30 mEq by mouth 2 (two) times daily.   Rodolph Bong, MD Taking Active Self, Pharmacy Records           Med Note Rolan Lipa, Charm Barges   Tue Mar 17, 2023 12:52 PM) Reports 30 meq bid  prochlorperazine (COMPAZINE) 10 MG tablet [id number] No Take 1 tablet (10 mg total) by mouth every 6 (six) hours as needed for nausea or vomiting. Rana Snare, NP Taking Active Self, Pharmacy Records  Sodium Chloride Flush (NORMAL SALINE FLUSH) 0.9 % SOLN [id number] No Use 5 mLs by Intracatheter route daily in biliary drain at 6 (six) AM as directed [place], Alvira Philips, DO Taking Active             Home Care and Equipment/Supplies: Were Home Health Services Ordered?: NA Any new equipment or medical supplies ordered?: NA  Functional Questionnaire: Do you need assistance with bathing/showering or dressing?: No Do you need assistance with meal preparation?: No Do you need assistance with eating?: No Do you have difficulty maintaining continence: No Do you need assistance with getting out of bed/getting out of a chair/moving?: No Do you have difficulty managing or taking your medications?: No  Follow up appointments reviewed: PCP Follow-up appointment confirmed?: Yes Date of PCP follow-up appointment?: 04/15/23 Follow-up Provider: Celedonio Miyamoto Follow-up appointment confirmed?: NA Do you need transportation to your follow-up appointment?: No Do you understand care options if your condition(s) worsen?: Yes-patient verbalized understanding  SDOH Interventions Today    Flowsheet Row Most  Recent Value  SDOH Interventions   Food Insecurity Interventions Intervention Not Indicated  Housing Interventions Intervention Not Indicated  Transportation Interventions Intervention Not Indicated  Utilities Interventions Intervention Not Indicated      Interventions Today    Flowsheet Row Most Recent Value  Chronic Disease   Chronic disease during today's visit Other  [Cancer]  General Interventions   General Interventions Discussed/Reviewed General Interventions Discussed, General Interventions Reviewed, Doctor Visits  Doctor Visits Discussed/Reviewed Doctor Visits Reviewed  Education Interventions   Education Provided Provided Education  Provided Verbal Education On Insurance Plans, Medication  Pharmacy Interventions   Pharmacy Dicussed/Reviewed Medications and their functions  Safety Interventions   Safety Discussed/Reviewed Fall Risk      The patient has been provided with contact information for the care management team and has been advised to call with any health-related questions or concerns. The patient verbalized understanding with current POC. The  patient is directed to their insurance card regarding availability of benefits coverage.  Deidre Ala, BSN, RN [place]  [facility] - [employer] [facility] RN Care Manager [phone]

## 2023-03-17 NOTE — Progress Notes (Signed)
 April [name] OFFICE PROGRESS NOTE   Diagnosis: Colon cancer  INTERVAL HISTORY:   April [name] returns for follow-up.  She was hospitalized 03/09/2023 through 03/14/2023 presenting with abdominal pain, nausea/vomiting.  There was concern for gallbladder inflammation.  She was not felt to be a good surgical candidate.  Percutaneous cholecystostomy tube placed 03/11/2023.  Pain is better.  She continues to note generalized abdominal "tenderness".  She takes 4 mg of Dilaudid every 3-4 hours.  Bowels are moving.  She describes stool as "like a ball".  She is taking MiraLAX.  Objective:  Vital signs in last 24 hours:  Blood pressure 138/89, pulse 100, temperature 97.9 F (36.6 C), temperature source Temporal, resp. rate 18, height 5\' 4"  (1.626 m), weight 136 lb 8 oz (61.9 kg), last menstrual period 01/07/2006, SpO2 100%.    Resp: Lungs clear bilaterally. Cardio: Regular rate and rhythm. GI: Abdomen with mild generalized tenderness.  Right abdomen drain site is without erythema. Vascular: No leg edema. Neuro: Alert and oriented. PICC without erythema.  Lab Results:  Lab Results  Component Value Date   WBC 9.4 03/13/2023   HGB 8.6 (L) 03/13/2023   HCT 28.1 (L) 03/13/2023   MCV 96.9 03/13/2023   PLT 329 03/13/2023   NEUTROABS 8.1 (H) 03/12/2023    Imaging:  No results found.  Medications: I have reviewed the patient's current medications.  Assessment/Plan: Sigmoid colon cancer, stage IV (pT4a,pN2b,M1c) Colonoscopy 03/24/2019-3 rectal polyps-hyperplastic polyps, distal colon biopsy-at least intramucosal adenocarcinoma, completely obstructing mass in the distal sigmoid colon, could not be traversed, intact mismatch repair protein expression 03/24/2019-CEA 56.1 03/29/2019 CT abdomen/pelvis-circumferential thickening involving the entire mid and distal sigmoid colon to the level of the rectosigmoid junction, solid/cystic mass in the left ovary, bilateral  nephrolithiasis Robotic assisted low anterior resection, mesenteric lymphadenectomy, bilateral salpingo-oophorectomy 04/08/2019 Pathology ([facility] review of outside pathology) metastatic adenocarcinoma involving the peritoneum overlying the round ligament, serosa of the urinary bladder, serosa of the right ureter a sacral area, pelvic peritoneum, and left ovary.  Omental biopsy with focal mucin pools with no carcinoma cells identified, right hemidiaphragm biopsy involved by metastatic adenocarcinoma, invasive adenocarcinoma the sigmoid colon, moderately differentiated, T4a, perineural and vascular invasion present, 7/22 lymph nodes, multiple tumor deposits, resection margins negative Negative for PD-L1, low probability of MSI-high, HER-2 negative, negative for BRAF, NRAS and KRAS alterations CTs 05/19/2019-no evidence of metastatic disease, findings suspicious for colitis of the transverse and ascending colon, small amount of ascites in the cul-de-sac, bilateral renal calculi Cycle 1 FOLFIRI 05/24/2019, bevacizumab added with cycle 2 Cycle 4 FOLFIRI/bevacizumab 07/05/2019 Cycle 5 FOLFIRI 07/27/2019, bevacizumab held secondary to hypertension Cycle 6 FOLFIRI 08/10/1999, bevacizumab held secondary to hypertension CTs 08/30/2019-no evidence of recurrent disease, new subsolid right lower lobe nodule felt to be inflammatory, emphysema Cycle 7 FOLFIRI 09/26/2019, bevacizumab remains on hold secondary to hypertension Cycle 8 FOLFIRI 10/17/2019, bevacizumab held secondary to hypertension, Udenyca added for neutropenia Cycle 9 FOLFIRI 11/07/2019, bevacizumab held secondary to hypertension, Udenyca Cycle 10 FOLFIRI 11/28/2019, bevacizumab held, Udenyca Cycle 11 FOLFIRI 12/26/2019, bevacizumab held, Udenyca CTs 01/25/2020-no evidence of recurrent disease, resolution of right lower lobe nodule Maintenance Xeloda beginning 02/06/2020 CTs 04/25/2020- no evidence of metastatic disease, multiple bilateral renal calculi without  hydronephrosis Maintenance Xeloda continued CT abdomen/pelvis without contrast 08/10/2020-bilateral staghorn renal calculi, no evidence of metastatic disease; addendum 08/23/2020-small but increasing omental nodules. Cycle 1 FOLFIRI/panitumumab 09/19/2020 Cycle 2 FOLFIRI/Panitumumab 10/03/2020 Cycle 3 FOLFIRI/Panitumumab 10/17/2020 Cycle 4 FOLFIRI/panitumumab 10/31/2020 Cycle 5 FOLFIRI/Panitumumab 11/14/2020 CTs 11/26/2020-stable  omental metastases compared to 10/22/2020, mildly decreased from 08/10/2020.  Left lower quadrant tiny paracolic gutter implant slightly decreased from CT 08/10/2020.  No new or progressive metastatic disease in the abdomen or pelvis. Cycle 6 FOLFIRI/Panitumumab 11/28/2020, irinotecan dose reduced, treatment schedule adjusted to every 3 weeks going forward Cycle 7 FOLFIRI/panitumumab 12/24/2020 Cycle 8 FOLFIRI/Panitumumab 01/16/2021--treatment held due to hypertension in the infusion area. Cycle 8 FOLFIRI/panitumumab 01/21/2021 Cycle 9 FOLFIRI/panitumumab 02/11/2021 Cycle 10 FOLFIRI/Panitumumab 03/04/2021 Cycle 11 FOLFIRI/Panitumumab 03/25/2021 CT abdomen/pelvis 04/10/2021-stable right omental and left posterior paracolic gutter nodules Cycle 12 FOLFIRI/panitumumab 04/15/2021 Cycle 13 FOLFIRI/panitumumab 05/06/2021 Cycle 14 FOLFIRI/Panitumumab 05/27/2021 Cycle 15 FOLFIRI/Panitumumab 06/17/2021 CT abdomen/pelvis 07/05/2021-slight enlargement of a dominant right omental implant, no new implants Patient requested a treatment break CT abdomen/pelvis 10/04/2021-mild increase in size of multiple omental soft tissue nodules, 4 mm calculus in the distal left ureter adjacent to the ureteral stent Cycle 1 Lonsurf 10/14/2021 10/28/2021 Avastin every 2 weeks Cycle 2 Lonsurf 11/11/2021 Cycle 3 Lonsurf 12/09/2021 Cycle 4 Lonsurf 01/06/2022 Avastin held 01/20/2022 due to proteinuria, 24-hour urine 43 mg CT/pelvis 01/30/2022-possible new peritoneal implant at the transverse colon, no significant change in  other omental/peritoneal implants, stable chronic rectal wall thickening, status post removal of double-J left ureteral stent with no evidence of hydronephrosis or ureteral calculus, nonobstructing bilateral renal calculi Cycle 5 Lonsurf 02/05/2022 or 02/06/2022 Avastin every 2 weeks Cycle 6 Lonsurf 03/03/2022 CTs 04/05/2022-increase in size and number of peritoneal implants compared to January 2024 central liver mass narrowing the anterior branch of the right portal vein, decreased biliary duct dilation, biliary stents in place, stable right cardiophrenic lymph nodes, separate pigtail stent in the lumen of the proximal duodenum Cycle 1 FOLFOX 04/14/2022 CT abdomen/pelvis 04/16/2022-stent within the third portion of the duodenum, stable peritoneal nodules, indwelling biliary stents with mild left biliary duct dilation Cycle 2 FOLFOX 04/28/2022, Emend and prophylactic dexamethasone added Cycle 3 FOLFOX 05/12/2022, Aloxi, Emend, prophylactic dexamethasone, Compazine and lorazepam as needed Chemotherapy held 05/25/2020 due to severe hypertension, evaluated in the emergency department Cycle 4 FOLFOX 06/03/2022 Cycle 5 FOLFOX 06/16/2022, oxaliplatin dose reduced and 5-FU bolus eliminated due to neutropenia CT 06/25/2022-unchanged soft tissue at the hepatic hilum, unchanged, bile duct stent, decreased size of peritoneal and omental nodules and diminished volume of likely fluid in the lower left pelvis Cycle 6 FOLFOX 06/30/2022 Cycle 7 FOLFOX 07/21/2022 CT 07/24/2022 (in the emergency department with nausea, headache, abdominal pain)-no acute pathology.  No significant interval change in omental nodularity. Cycle 8 FOLFOX 08/04/2022, pruritus on the palms at the end of the oxaliplatin infusion, Pepcid and Benadryl given, symptoms resolved, infusion not resumed Cycle 9 FOLFOX 08/18/2022-Pepcid and Benadryl added to premedications, Oxaliplatin diluted in a larger volume and infusion time increased Cycle 10 FOLFOX  09/08/2022 Cycle 11 FOLFOX 09/22/2022 10/06/2022 patient declined treatment CTs 10/15/2022-similar soft tissue fullness in the hepatic hilum with biliary stents in place, decrease in omental/peritoneal metastases Maintenance capecitabine 10/26/2022 CT abdomen/pelvis 11/19/2022-mild increase in tumor studding at the left paracolic gutter and omentum, circumferential wall thickening of the rectum, biliary stents in place with minimal increase in intrahepatic biliary dilation, nonobstructive renal calculi Colonoscopy 11/23/2022-anastomosis at 15 cm from the anal verge-patent, 2 cm polypoid tissue at the anastomosis biopsied, invasive adenocarcinoma moderately differentiated, preserved expression of major MMR proteins CTs 12/15/2022: Possible developing nodule in the lateral left liver, increased omental nodularity, persistent wall thickening in the upper rectum CT abdomen/pelvis 12/24/2022: Increased thickened fold in the transverse colon-infectious/inflammatory, 2.1 cm lateral left liver lesion, potential new  5 mm segment 5 lesion, stable biliary stents with intrahepatic biliary dilation, central biliary wall thickening near the hepatic hilum, multiple peritoneal/omental implants unchanged from 12/15/2022, but likely larger than 11/19/2022 Cycle 1 irinotecan/Panitumumab 01/06/2023 Cycle 2 irinotecan/Panitumumab 01/20/2023 CTs 01/24/2023-slight increase in omental nodularity. Cycle 3 irinotecan/Panitumumab 02/03/2023 Cycle 4 irinotecan/panitumumab 02/17/2023 CTs abdomen/pelvis 02/24/2023-subtly hypoattenuating lesion within the peripheral aspect of hepatic segment 2 redemonstrated.  Additional scattered subcentimeter low-density lesions within the liver too small to characterize.  Prominent intrahepatic biliary dilatation with biliary stents in place.  Similar degree of nodularity along the omentum and left paracolic gutter.  No ascites. CTs 03/09/2023-indwelling endoscopically placed stents; level of intrahepatic  biliary duct dilatation is slightly improved on the left and some on the right.  Gallbladder is dilated with wall edema which is increasing.  More nodular tissue extending up along the porta hepatis.  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  Rectum with nodular wall thickening with extension into the adjacent tissue, nodular areas seen extending perirectal and presacral spaces.  Peritoneal carcinomatosis again identified.   Hypertension G4 P3, twins Kidney stones-bilateral staghorn renal calculi on CT 08/10/2020 Infected Port-A-Cath 09/12/2019-placed on Augmentin, referred for Port-A-Cath removal; Port-A-Cath removed 09/14/2019; PICC line placed 09/14/2019; culture staph aureus.  Course of Septra completed. Neutropenia secondary to chemotherapy-Udenyca added with cycle 8 FOLFIRI Right nephrostomy tube 09/12/2020; stent placed 10/09/2020, percutaneous nephrostolithotomy treatment of right-sided kidney stones 10/09/2020, malpositioned right ureter stent replacement 10/23/2020, right ureter stent removed 10/29/2020 Percutaneous left nephrostolithotomy 10/01/2021-no renal stone identified, left ureter stent left in place Cystoscopy 10/14/2021-stent removed 8.  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  2 moderate biliary strictures found in the hepatic duct system, malignant appearing.  Left and right hepatic duct and all  intrahepatic branches were moderately dilated secondary to the strictures.  The strictures were dilated.  Plastic stent placed into the left hepatic duct and right hepatic duct. 9.  Admission 04/16/2022 with nausea/vomiting and abdominal pain, felt to be acute toxicity related to chemotherapy, discharged home 04/18/2022 10.  Admission 11/19/2022 with nausea/vomiting and abdominal pain-improved 11.  Admission 12/15/2022 with fever and right submandibular swelling/tenderness CT neck 12/15/2022: 2 mm distal right submandibular duct calculus with mild inflammatory change adjacent to the right submandibular gland 12.  Admission 12/24/2022 with a fever nausea/vomiting, diarrhea, Klebsiella bacteremia completed course of Augmentin 13.  Admission with abdominal pain and nausea/vomiting 03/09/2023 through 03/14/2023-concern for gallbladder inflammation.  Not felt to be a surgical candidate.  Percutaneous cholecystostomy tube placed 03/11/2023.    Disposition: April [name] appears stable.  Abdominal pain is better since placement of the cholecystostomy tube.  We will contact the drain clinic for assistance with management of the tube.    Bowels are moving though she notes hard stools.  She will continue MiraLAX and begin a stool softener.  Treatment for the colon cancer remains on hold.  She would like to consider resuming treatment next week.  She will return for follow-up and possible treatment 03/26/2023.    Lonna Cobb ANP/GNP-BC  03/17/2023  1:16 PM

## 2023-03-17 NOTE — Patient Instructions (Signed)
 [facility] A DEPT OF [facility]  Discharge Instructions: Thank you for choosing [facility] to provide your oncology and hematology care.   If you have a lab appointment with the Cancer Center, please go directly to the Cancer Center and check in at the registration area.   Wear comfortable clothing and clothing appropriate for easy access to any Portacath or PICC line.   We strive to give you quality time with your provider. You may need to reschedule your appointment if you arrive late (15 or more minutes).  Arriving late affects you and other patients whose appointments are after yours.  Also, if you miss three or more appointments without notifying the office, you may be dismissed from the clinic at the provider's discretion.      For prescription refill requests, have your pharmacy contact our office and allow 72 hours for refills to be completed.    Today you received the following PICC dressing change.  PICC Home Care Guide A peripherally inserted central catheter (PICC) is a form of IV access that allows medicines and IV fluids to be quickly put into the blood and spread throughout the body. The PICC is a long, thin, flexible tube (catheter) that is put into a vein in a person's arm or leg. The catheter ends in a large vein just outside the heart called the superior vena cava (SVC). After the PICC is put in, a chest X-ray may be done to make sure that it is in the right place. A PICC may be placed for different reasons, such as: To give medicines and liquid nutrition. To give IV fluids and blood products. To take blood samples often. If there is trouble placing a peripheral intravenous (PIV) catheter. If cared for properly, a PICC can remain in place for many months. Having a PICC can allow you to go home from the hospital sooner and continue treatment at home. Medicines and PICC care can be managed at home by a family member, caregiver,  or home health care team. What are the risks? Generally, having a PICC is safe. However, problems may occur, including: A blood clot (thrombus) forming in or at the end of the PICC. A blood clot forming in a vein (deep vein thrombosis) or traveling to the lung (pulmonary embolism). Inflammation of the vein (phlebitis) in which the PICC is placed. Infection at the insertion site or in the blood. Blood infections from central lines, like PICCs, can be serious and often require a hospital stay. PICC malposition, or PICC movement or poor placement. A break or cut in the PICC. Do not use scissors near the PICC. Nerve or tendon irritation or injury during PICC insertion. How to care for your PICC Please follow the specific guidelines provided by your health care provider. Preventing infection You and any caregivers should wash your hands often with soap and water for at least 20 seconds. Wash hands: Before touching the PICC or the infusion device. Before changing a bandage (dressing). Do not change the dressing unless you have been taught to do so and have shown you are able to change it safely. Flush the PICC as told. Tell your health care provider right away if the PICC is hard to flush or does not flush. Do not use force to flush the PICC. Use clean and germ-free (sterile) supplies only. Keep the supplies in a dry place. Do not reuse needles, syringes, or any other supplies. Reusing supplies  can lead to infection. Keep the PICC dressing dry and secure it with tape if the edges stop sticking to your skin. Check your PICC insertion site every day for signs of infection. Check for: Redness, swelling, or pain. Fluid or blood. Warmth. Pus or a bad smell. Preventing other problems Do not use a syringe that is less than 10 mL to flush the PICC. Do not have your blood pressure checked on the arm in which the PICC is placed. Do not ever pull or tug on the PICC. Keep it secured to your arm with tape or  a stretch wrap when not in use. Do not take the PICC out yourself. Only a trained health care provider should remove the PICC. Keep pets and children away from your PICC. How to care for your PICC dressing Keep your PICC dressing clean and dry to prevent infection. Do not take baths, swim, or use a hot tub until your health care provider approves. Ask your health care provider if you can take showers. You may only be allowed to take sponge baths. When you are allowed to shower: Ask your health care provider to teach you how to wrap the PICC. Cover the PICC with clear plastic wrap and tape to keep it dry while showering. Follow instructions from your health care provider about how to take care of your insertion site and dressing. Make sure you: Wash your hands with soap and water for at least 20 seconds before and after you change your dressing. If soap and water are not available, use hand sanitizer. Change your dressing only if taught to do so by your health care provider. Your PICC dressing needs to be changed if it becomes loose or wet. Leave stitches (sutures), skin glue, or adhesive strips in place. These skin closures may need to stay in place for 2 weeks or longer. If adhesive strip edges start to loosen and curl up, you may trim the loose edges. Do not remove adhesive strips completely unless your health care provider tells you to do that. Follow these instructions at home: Disposal of supplies Throw away any syringes in a disposal container that is meant for sharp items (sharps container). You can buy a sharps container from a pharmacy, or you can make one by using an empty, hard plastic bottle with a lid. Place any used dressings or infusion bags into a plastic bag. Throw that bag in the trash. General instructions  Always carry your PICC identification card or wear a medical alert bracelet. Keep the tube clamped at all times, unless it is being used. Always carry a smooth-edge clamp  with you to clamp the PICC if it breaks. Do not use scissors or sharp objects near the tube. You may bend your arm and move it freely. If your PICC is near or at the bend of your elbow, avoid activity with repeated motion at the elbow. Avoid lifting heavy objects as told by your health care provider. Keep all follow-up visits. This is important. You will need to have your PICC dressing changed at least once a week. Contact a health care provider if: You have pain in your arm, ear, face, or teeth. You have a fever or chills. You have redness, swelling, or pain around the insertion site. You have fluid or blood coming from the insertion site. Your insertion site feels warm to the touch. You have pus or a bad smell coming from the insertion site. Your skin feels hard and raised around the  insertion site. Your PICC dressing has gotten wet or is coming off and you have not been taught how to change it. Get help right away if: You have problems with your PICC, such as your PICC: Was tugged or pulled and has partially come out. Do not  push the PICC back in. Cannot be flushed, is hard to flush, or leaks around the insertion site when it is flushed. Makes a flushing sound when it is flushed. Appears to have a hole or tear. Is accidentally pulled all the way out. If this happens, cover the insertion site with a gauze dressing. Do not throw the PICC away. Your health care provider will need to check it to be sure the entire catheter came out. You feel your heart racing or skipping beats, or you have chest pain. You have shortness of breath or trouble breathing. You have swelling, redness, warmth, or pain in the arm in which the PICC is placed. You have a red streak going up your arm that starts under the PICC dressing. These symptoms may be an emergency. Get help right away. Call 911. Do not wait to see if the symptoms will go away. Do not drive yourself to the hospital. Summary A peripherally  inserted central catheter (PICC) is a long, thin, flexible tube (catheter) that is put into a vein in the arm or leg. If cared for properly, a PICC can remain in place for many months. Having a PICC can allow you to go home from the hospital sooner and continue treatment at home. The PICC is inserted using a germ-free (sterile) technique by a specially trained health care provider. Only a trained health care provider should remove it. Do not have your blood pressure checked on the arm in which your PICC is placed. Always keep your PICC identification card with you. This information is not intended to replace advice given to you by your health care provider. Make sure you discuss any questions you have with your health care provider. Document Revised: 07/18/2020 Document Reviewed: 07/18/2020 Elsevier Patient Education  2024 [employer]   To help prevent nausea and vomiting after your treatment, we encourage you to take your nausea medication as directed.  BELOW ARE SYMPTOMS THAT SHOULD BE REPORTED IMMEDIATELY: *FEVER GREATER THAN 100.4 F (38 C) OR HIGHER *CHILLS OR SWEATING *NAUSEA AND VOMITING THAT IS NOT CONTROLLED WITH YOUR NAUSEA MEDICATION *UNUSUAL SHORTNESS OF BREATH *UNUSUAL BRUISING OR BLEEDING *URINARY PROBLEMS (pain or burning when urinating, or frequent urination) *BOWEL PROBLEMS (unusual diarrhea, constipation, pain near the anus) TENDERNESS IN MOUTH AND THROAT WITH OR WITHOUT PRESENCE OF ULCERS (sore throat, sores in mouth, or a toothache) UNUSUAL RASH, SWELLING OR PAIN  UNUSUAL VAGINAL DISCHARGE OR ITCHING   Items with * indicate a potential emergency and should be followed up as soon as possible or go to the Emergency Department if any problems should occur.  Please show the CHEMOTHERAPY ALERT CARD or IMMUNOTHERAPY ALERT CARD at check-in to the Emergency Department and triage nurse.  Should you have questions after your visit or need to cancel or reschedule your  appointment, please contact [facility]  Dept: [phone]  and follow the prompts.  Office hours are 8:00 a.m. to 4:30 p.m. Monday - Friday. Please note that voicemails left after 4:00 p.m. may not be returned until the following business day.  We are closed weekends and major holidays. You have access to a nurse at  all times for urgent questions. Please call the main number to the clinic Dept: [phone] and follow the prompts.   For any non-urgent questions, you may also contact your provider using MyChart. We now offer e-Visits for anyone 68 and older to request care online for non-urgent symptoms. For details visit [email or web address].   Also download the MyChart app! Go to the app store, search "MyChart", open the app, select [facility], and log in with your MyChart username and password.

## 2023-03-17 NOTE — Telephone Encounter (Signed)
 The patient is scheduled to see the provider at [facility], located at [facility] in [place], on April 20, 2023. She is informed that her appointment requires her to arrive by 12:40 PM.

## 2023-03-18 LAB — AEROBIC/ANAEROBIC CULTURE W GRAM STAIN (SURGICAL/DEEP WOUND)

## 2023-03-24 ENCOUNTER — Ambulatory Visit: Payer: 59 | Admitting: Nurse Practitioner

## 2023-03-24 ENCOUNTER — Other Ambulatory Visit: Payer: 59

## 2023-03-24 ENCOUNTER — Emergency Department (HOSPITAL_COMMUNITY)

## 2023-03-24 ENCOUNTER — Other Ambulatory Visit: Payer: Self-pay

## 2023-03-24 ENCOUNTER — Inpatient Hospital Stay (HOSPITAL_COMMUNITY)
Admission: EM | Admit: 2023-03-24 | Discharge: 2023-03-26 | DRG: 304 | Disposition: A | Discharge status: Home / Self Care | Attending: Internal Medicine | Admitting: Internal Medicine

## 2023-03-24 ENCOUNTER — Ambulatory Visit: Payer: 59

## 2023-03-24 ENCOUNTER — Telehealth: Payer: Self-pay

## 2023-03-24 DIAGNOSIS — R1084 Generalized abdominal pain: Principal | ICD-10-CM

## 2023-03-24 DIAGNOSIS — Z823 Family history of stroke: Secondary | ICD-10-CM

## 2023-03-24 DIAGNOSIS — C787 Secondary malignant neoplasm of liver and intrahepatic bile duct: Secondary | ICD-10-CM | POA: Diagnosis present

## 2023-03-24 DIAGNOSIS — I10 Essential (primary) hypertension: Secondary | ICD-10-CM | POA: Diagnosis present

## 2023-03-24 DIAGNOSIS — Z888 Allergy status to other drugs, medicaments and biological substances status: Secondary | ICD-10-CM

## 2023-03-24 DIAGNOSIS — Z833 Family history of diabetes mellitus: Secondary | ICD-10-CM

## 2023-03-24 DIAGNOSIS — D630 Anemia in neoplastic disease: Secondary | ICD-10-CM | POA: Diagnosis present

## 2023-03-24 DIAGNOSIS — I160 Hypertensive urgency: Principal | ICD-10-CM | POA: Diagnosis present

## 2023-03-24 DIAGNOSIS — C189 Malignant neoplasm of colon, unspecified: Secondary | ICD-10-CM

## 2023-03-24 DIAGNOSIS — G893 Neoplasm related pain (acute) (chronic): Secondary | ICD-10-CM | POA: Diagnosis present

## 2023-03-24 DIAGNOSIS — Z79899 Other long term (current) drug therapy: Secondary | ICD-10-CM

## 2023-03-24 DIAGNOSIS — K831 Obstruction of bile duct: Secondary | ICD-10-CM | POA: Diagnosis not present

## 2023-03-24 DIAGNOSIS — R112 Nausea with vomiting, unspecified: Secondary | ICD-10-CM | POA: Diagnosis present

## 2023-03-24 DIAGNOSIS — C187 Malignant neoplasm of sigmoid colon: Secondary | ICD-10-CM | POA: Diagnosis present

## 2023-03-24 DIAGNOSIS — Z87891 Personal history of nicotine dependence: Secondary | ICD-10-CM

## 2023-03-24 DIAGNOSIS — E876 Hypokalemia: Secondary | ICD-10-CM | POA: Diagnosis not present

## 2023-03-24 DIAGNOSIS — C786 Secondary malignant neoplasm of retroperitoneum and peritoneum: Secondary | ICD-10-CM | POA: Diagnosis present

## 2023-03-24 DIAGNOSIS — Z85048 Personal history of other malignant neoplasm of rectum, rectosigmoid junction, and anus: Secondary | ICD-10-CM

## 2023-03-24 DIAGNOSIS — T801XXA Vascular complications following infusion, transfusion and therapeutic injection, initial encounter: Secondary | ICD-10-CM

## 2023-03-24 DIAGNOSIS — I7121 Aneurysm of the ascending aorta, without rupture: Secondary | ICD-10-CM | POA: Diagnosis present

## 2023-03-24 DIAGNOSIS — E860 Dehydration: Secondary | ICD-10-CM | POA: Diagnosis present

## 2023-03-24 DIAGNOSIS — J439 Emphysema, unspecified: Secondary | ICD-10-CM | POA: Diagnosis present

## 2023-03-24 DIAGNOSIS — Z8249 Family history of ischemic heart disease and other diseases of the circulatory system: Secondary | ICD-10-CM

## 2023-03-24 LAB — MAGNESIUM: Magnesium: 1.8 mg/dL (ref 1.7–2.4)

## 2023-03-24 LAB — COMPREHENSIVE METABOLIC PANEL
ALT: 52 U/L — ABNORMAL HIGH (ref 0–44)
AST: 67 U/L — ABNORMAL HIGH (ref 15–41)
Albumin: 3.5 g/dL (ref 3.5–5.0)
Alkaline Phosphatase: 690 U/L — ABNORMAL HIGH (ref 38–126)
Anion gap: 11 (ref 5–15)
BUN: 6 mg/dL (ref 6–20)
CO2: 29 mmol/L (ref 22–32)
Calcium: 9.3 mg/dL (ref 8.9–10.3)
Chloride: 94 mmol/L — ABNORMAL LOW (ref 98–111)
Creatinine, Ser: 0.32 mg/dL — ABNORMAL LOW (ref 0.44–1.00)
GFR, Estimated: >60 mL/min (ref >60)
Glucose, Bld: 85 mg/dL (ref 70–99)
Potassium: 3.2 mmol/L — ABNORMAL LOW (ref 3.5–5.1)
Sodium: 134 mmol/L — ABNORMAL LOW (ref 135–145)
Total Bilirubin: 0.7 mg/dL (ref 0.0–1.2)
Total Protein: 8.7 g/dL — ABNORMAL HIGH (ref 6.5–8.1)

## 2023-03-24 LAB — CBC WITH DIFFERENTIAL/PLATELET
Abs Immature Granulocytes: 0.03 10*3/uL (ref 0.00–0.07)
Basophils Absolute: 0.1 10*3/uL (ref 0.0–0.1)
Basophils Relative: 1 %
Eosinophils Absolute: 0.2 10*3/uL (ref 0.0–0.5)
Eosinophils Relative: 2 %
HCT: 34 % — ABNORMAL LOW (ref 36.0–46.0)
Hemoglobin: 10.9 g/dL — ABNORMAL LOW (ref 12.0–15.0)
Immature Granulocytes: 0 %
Lymphocytes Relative: 32 %
Lymphs Abs: 2.6 10*3/uL (ref 0.7–4.0)
MCH: 30.2 pg (ref 26.0–34.0)
MCHC: 32.1 g/dL (ref 30.0–36.0)
MCV: 94.2 fL (ref 80.0–100.0)
Monocytes Absolute: 0.6 10*3/uL (ref 0.1–1.0)
Monocytes Relative: 8 %
Neutro Abs: 4.6 10*3/uL (ref 1.7–7.7)
Neutrophils Relative %: 57 %
Platelets: 468 10*3/uL — ABNORMAL HIGH (ref 150–400)
RBC: 3.61 MIL/uL — ABNORMAL LOW (ref 3.87–5.11)
RDW: 17 % — ABNORMAL HIGH (ref 11.5–15.5)
WBC: 8 10*3/uL (ref 4.0–10.5)
nRBC: 0 % (ref 0.0–0.2)

## 2023-03-24 LAB — URINALYSIS, ROUTINE W REFLEX MICROSCOPIC
Bilirubin Urine: NEGATIVE
Glucose, UA: NEGATIVE mg/dL
Hgb urine dipstick: NEGATIVE
Ketones, ur: 5 mg/dL — AB
Leukocytes,Ua: NEGATIVE
Nitrite: NEGATIVE
Protein, ur: NEGATIVE mg/dL
Specific Gravity, Urine: 1.034 — ABNORMAL HIGH (ref 1.005–1.030)
pH: 7 (ref 5.0–8.0)

## 2023-03-24 LAB — I-STAT CG4 LACTIC ACID, ED
Lactic Acid, Venous: 0.9 mmol/L (ref 0.5–1.9)
Lactic Acid, Venous: 0.6 mmol/L (ref 0.5–1.9)

## 2023-03-24 LAB — LIPASE, BLOOD: Lipase: 27 U/L (ref 11–51)

## 2023-03-24 MED ORDER — HYALURONIDASE HUMAN 150 UNIT/ML IJ SOLN
150.0000 [IU] | Freq: Once | INTRAMUSCULAR | Status: DC
  Filled 2023-03-24: qty 1

## 2023-03-24 MED ORDER — POTASSIUM CHLORIDE 10 MEQ/100ML IV SOLN
10.0000 meq | INTRAVENOUS | Status: AC
  Administered 2023-03-24 (×2): 10 meq via INTRAVENOUS
  Filled 2023-03-24 (×2): qty 100

## 2023-03-24 MED ORDER — METOPROLOL TARTRATE 25 MG PO TABS
50.0000 mg | ORAL_TABLET | Freq: Once | ORAL | Status: AC
  Administered 2023-03-24: 50 mg via ORAL
  Filled 2023-03-24: qty 2

## 2023-03-24 MED ORDER — HYDROMORPHONE HCL 1 MG/ML IJ SOLN
1.0000 mg | INTRAMUSCULAR | Status: DC | PRN
  Administered 2023-03-24 – 2023-03-26 (×12): 1 mg via INTRAVENOUS
  Filled 2023-03-24 (×12): qty 1

## 2023-03-24 MED ORDER — IOHEXOL 300 MG/ML  SOLN
100.0000 mL | Freq: Once | INTRAMUSCULAR | Status: AC | PRN
  Administered 2023-03-24: 100 mL via INTRAVENOUS

## 2023-03-24 MED ORDER — MORPHINE SULFATE (PF) 4 MG/ML IV SOLN
4.0000 mg | Freq: Once | INTRAVENOUS | Status: AC
  Administered 2023-03-24: 4 mg via INTRAVENOUS
  Filled 2023-03-24: qty 1

## 2023-03-24 MED ORDER — HYDRALAZINE HCL 20 MG/ML IJ SOLN
10.0000 mg | Freq: Once | INTRAMUSCULAR | Status: AC
  Administered 2023-03-24: 10 mg via INTRAVENOUS
  Filled 2023-03-24: qty 1

## 2023-03-24 MED ORDER — ONDANSETRON HCL 4 MG/2ML IJ SOLN
4.0000 mg | Freq: Once | INTRAMUSCULAR | Status: AC
  Administered 2023-03-24: 4 mg via INTRAVENOUS
  Filled 2023-03-24: qty 2

## 2023-03-24 MED ORDER — SODIUM CHLORIDE 0.9 % IV BOLUS
1000.0000 mL | Freq: Once | INTRAVENOUS | Status: AC
  Administered 2023-03-24: 1000 mL via INTRAVENOUS

## 2023-03-24 MED ORDER — POTASSIUM CHLORIDE CRYS ER 20 MEQ PO TBCR
40.0000 meq | EXTENDED_RELEASE_TABLET | Freq: Once | ORAL | Status: AC
  Administered 2023-03-24: 40 meq via ORAL
  Filled 2023-03-24: qty 2

## 2023-03-24 MED ORDER — HYDRALAZINE HCL 20 MG/ML IJ SOLN
10.0000 mg | INTRAMUSCULAR | Status: DC | PRN
  Administered 2023-03-25 – 2023-03-26 (×3): 10 mg via INTRAVENOUS
  Filled 2023-03-24 (×3): qty 1

## 2023-03-24 MED ORDER — LABETALOL HCL 5 MG/ML IV SOLN
10.0000 mg | INTRAVENOUS | Status: DC | PRN
  Administered 2023-03-24 – 2023-03-25 (×3): 10 mg via INTRAVENOUS
  Filled 2023-03-24 (×4): qty 4

## 2023-03-24 MED ORDER — PROCHLORPERAZINE EDISYLATE 10 MG/2ML IJ SOLN
10.0000 mg | Freq: Four times a day (QID) | INTRAMUSCULAR | Status: DC | PRN
  Administered 2023-03-24 – 2023-03-26 (×4): 10 mg via INTRAVENOUS
  Filled 2023-03-24 (×4): qty 2

## 2023-03-24 MED ORDER — ONDANSETRON HCL 4 MG/2ML IJ SOLN
4.0000 mg | Freq: Once | INTRAMUSCULAR | Status: AC
Start: 2023-03-24 — End: 2023-03-24
  Administered 2023-03-24: 4 mg via INTRAVENOUS
  Filled 2023-03-24: qty 2

## 2023-03-24 MED ORDER — HYDROMORPHONE HCL 1 MG/ML IJ SOLN
1.0000 mg | Freq: Once | INTRAMUSCULAR | Status: AC
  Administered 2023-03-24: 1 mg via INTRAVENOUS
  Filled 2023-03-24: qty 1

## 2023-03-24 NOTE — Assessment & Plan Note (Signed)
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 Meridee Score, 02/26/23) - s/p cholecystostomy tube placement with IR on 03/11/2023 -LFTs increased from prior values; downtrended  -IR flushed drain with no difficulty in the ER.  Continue drain flushes with 5 cc sterile saline daily

## 2023-03-24 NOTE — ED Provider Notes (Addendum)
 [place] EMERGENCY DEPARTMENT AT [facility] Provider Note   CSN: [id number] Arrival date & time: 03/24/23  1430     History  Chief Complaint  Patient presents with   Abdominal Pain    April [name] is a 48 y.o. female.  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 Meridee Score 02/26/2023), portal vein thrombus,, anemia of chronic medical disease, essential hypertension and recent cholecystitis s/p cholecystostomy tube placement on 03/11/23.  Pt was d/c on 3/1 and had been relatively stable until her pain started worsening a few days ago.  Today, pt noticed some "pus" coming out of drainage bag.  She also has some n/v.  She felt like she had fevers last night and today.          Home Medications Prior to Admission medications   Medication Sig Start Date End Date Taking? Authorizing Provider  amLODipine (NORVASC) 10 MG tablet Take 1 tablet (10 mg total) by mouth daily. 11/24/22   Rodolph Bong, MD  dexamethasone (DECADRON) 4 MG tablet Take 1 tablet by mouth twice a day for 3 days beginning day 2 of chemo. Patient not taking: Reported on 03/17/2023 02/03/23   Rana Snare, NP  dicyclomine (BENTYL) 20 MG tablet Take 1 tablet (20 mg total) by mouth 3 (three) times daily as needed for spasms (rectal/AB pain). Patient taking differently: Take 20 mg by mouth 2 (two) times daily as needed for spasms (rectal/AB pain). 02/17/23   Ladene Artist, MD  HYDROmorphone (DILAUDID) 2 MG tablet Take 1 tablet (2 mg total) by mouth every 4 (four) hours as needed for severe pain (pain score 7-10) or moderate pain (pain score 4-6). 03/14/23   [place], Eric J, DO  lactulose (CHRONULAC) 10 GM/15ML solution Take 15-30 mLs by mouth daily as needed for mild constipation.    [provider]  linaclotide (LINZESS)  72 MCG capsule Take 72 mcg by mouth daily as needed (constipation). Patient not taking: Reported on 03/17/2023    [provider]  metoprolol tartrate (LOPRESSOR) 50 MG tablet Take 1 tablet (50 mg total) by mouth 2 (two) times daily. 11/24/22   Rodolph Bong, MD  ondansetron (ZOFRAN) 8 MG tablet Take 1 tablet (8 mg total) by mouth every 8 (eight) hours as needed for nausea or vomiting. Can start taking 72 hours after chemotherapy Patient not taking: Reported on 03/09/2023 02/03/23   Rana Snare, NP  pantoprazole (PROTONIX) 40 MG tablet Take 1 tablet (40 mg total) by mouth daily before breakfast. 02/27/23   Meredeth Ide, MD  polyethylene glycol (MIRALAX / GLYCOLAX) 17 g packet Take 17 g by mouth daily as needed. Patient taking differently: Take 17 g by mouth daily as needed for moderate constipation or mild constipation. 02/27/23   Meredeth Ide, MD  potassium chloride (MICRO-K) 10 MEQ CR capsule Take 2 capsules (20 mEq total) by mouth 2 (two) times daily. Patient taking differently: Take 30 mEq by mouth 2 (two) times daily. 11/24/22   Rodolph Bong, MD  prochlorperazine (COMPAZINE) 10 MG tablet Take 1 tablet (10 mg total) by mouth every 6 (six) hours as needed for nausea or vomiting. 02/03/23   Rana Snare, NP  Sodium Chloride Flush (NORMAL SALINE FLUSH) 0.9 % SOLN Use 5 mLs by Intracatheter route daily in biliary drain at 6 (six) AM as  directed 03/14/23   [place], Eric J, DO      Allergies    Lisinopril, Irinotecan, Leucovorin calcium, and Oxaliplatin    Review of Systems   Review of Systems  Gastrointestinal:  Positive for abdominal pain, nausea and vomiting.  All other systems reviewed and are negative.   Physical Exam Updated Vital Signs BP (!) 187/120 (BP Location: Right Arm)   Pulse 78   Temp 98 F (36.7 C) (Oral)   Resp 18   LMP 01/07/2006   SpO2 100%  Physical Exam Vitals and nursing note reviewed.  Constitutional:      Appearance: She is well-developed.   HENT:     Head: Normocephalic and atraumatic.     Mouth/Throat:     Mouth: Mucous membranes are moist.     Pharynx: Oropharynx is clear.  Eyes:     Extraocular Movements: Extraocular movements intact.     Pupils: Pupils are equal, round, and reactive to light.  Cardiovascular:     Rate and Rhythm: Normal rate and regular rhythm.     Heart sounds: Normal heart sounds.  Pulmonary:     Effort: Pulmonary effort is normal.     Breath sounds: Normal breath sounds.  Abdominal:     General: Abdomen is flat. Bowel sounds are normal.     Palpations: Abdomen is soft.     Tenderness: There is generalized abdominal tenderness.  Skin:    General: Skin is warm.     Capillary Refill: Capillary refill takes less than 2 seconds.  Neurological:     General: No focal deficit present.     Mental Status: She is alert and oriented to person, place, and time.  Psychiatric:        Mood and Affect: Mood normal.        Behavior: Behavior normal.     ED Results / Procedures / Treatments   Labs (all labs ordered are listed, but only abnormal results are displayed) Labs Reviewed  CBC WITH DIFFERENTIAL/PLATELET - Abnormal; Notable for the following components:      Result Value   RBC 3.61 (*)    Hemoglobin 10.9 (*)    HCT 34.0 (*)    RDW 17.0 (*)    Platelets 468 (*)    All other components within normal limits  COMPREHENSIVE METABOLIC PANEL - Abnormal; Notable for the following components:   Sodium 134 (*)    Potassium 3.2 (*)    Chloride 94 (*)    Creatinine, Ser 0.32 (*)    Total Protein 8.7 (*)    AST 67 (*)    ALT 52 (*)    Alkaline Phosphatase 690 (*)    All other components within normal limits  CULTURE, BLOOD (ROUTINE X 2)  CULTURE, BLOOD (ROUTINE X 2)  LIPASE, BLOOD  MAGNESIUM  URINALYSIS, ROUTINE W REFLEX MICROSCOPIC  I-STAT CG4 LACTIC ACID, ED  I-STAT CG4 LACTIC ACID, ED    EKG EKG Interpretation Date/Time:  Tuesday March 24 2023 16:21:45 EDT Ventricular Rate:  71 PR  Interval:  150 QRS Duration:  92 QT Interval:  413 QTC Calculation: 449 R Axis:   64  Text Interpretation: Sinus rhythm Probable left atrial enlargement No significant change since last tracing Confirmed by Jacalyn Lefevre [phone]) on 03/24/2023 4:35:03 PM  Radiology No results found.  Procedures Procedures    Medications Ordered in ED Medications  potassium chloride 10 mEq in 100 mL IVPB (has no administration in time range)  sodium chloride 0.9 %  bolus 1,000 mL (1,000 mLs Intravenous New Bag/Given 03/24/23 1541)  morphine (PF) 4 MG/ML injection 4 mg (4 mg Intravenous Given 03/24/23 1535)  ondansetron (ZOFRAN) injection 4 mg (4 mg Intravenous Given 03/24/23 1535)    ED Course/ Medical Decision Making/ A&P                                 Medical Decision Making Amount and/or Complexity of Data Reviewed Labs: ordered. Radiology: ordered.  Risk Prescription drug management.   This patient presents to the ED for concern of abd pain, this involves an extensive number of treatment options, and is a complaint that carries with it a high risk of complications and morbidity.  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 Meridee Score 02/26/2023), portal vein thrombus,, anemia of chronic medical disease, essential hypertension and recent cholecystitis s/p cholecystostomy tube placement on 03/11/23   Additional history obtained:  Additional history obtained from epic chart review External records from outside source obtained and reviewed including EMS report   Lab Tests:  I Ordered, and personally interpreted labs.  The pertinent results include:  cbc with hgb 10.9 (hgb 8.6 on 2/28); lactic nl; cmp with k low at 3.2,  mg nl, LFTs elevated (AST 67, ALT 52, AP 690 (AST 12, ALT 14, and AP 153 on 2/28)   Imaging Studies ordered:  I ordered imaging studies including ct  Pending at shift change   Cardiac Monitoring:  The patient was maintained on a cardiac monitor.  I personally viewed and interpreted the cardiac monitored which showed an underlying rhythm of: nsr   Medicines ordered and prescription drug management:  I ordered medication including ivfs/morphine/zofran  for sx  Reevaluation of the patient after these medicines showed that the patient improved I have reviewed the patients home medicines and have made adjustments as needed   Test Considered:  ct   Critical Interventions:  ivfs   Consultations Obtained:  I requested consultation with IR (Dr. Milford Cage),  and discussed lab and imaging findings as well as pertinent plan -he did evaluate the drain and it looks good and is functioning well.   Problem List / ED Course:  Abd pain and n/v   Reevaluation:  After the interventions noted above, I reevaluated the patient and found that they have :improved   Social Determinants of Health:  Lives at home   Dispostion:  Pending at shift change        Final Clinical Impression(s) / ED Diagnoses Final diagnoses:  Generalized abdominal pain  Nausea and vomiting, unspecified vomiting type  Primary colon cancer with metastasis to other site [facility])  Dehydration  Hypokalemia    Rx / DC Orders ED Discharge Orders     None         Jacalyn Lefevre, MD 03/24/23 1637    Jacalyn Lefevre, MD 03/24/23 1642

## 2023-03-24 NOTE — Assessment & Plan Note (Signed)
-   Uncontrolled with oral Dilaudid at home - Has responded fairly well to Dilaudid IV - transitioned back to oral dilaudid

## 2023-03-24 NOTE — ED Notes (Signed)
 MD aware of pt. BP 167/121. Pt. Stated has not taken BP meds today.

## 2023-03-24 NOTE — Hospital Course (Signed)
 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 She was recently hospitalized from 03/09/2023 until 03/14/2023 due to acute cholecystitis and underwent PERC drain placement with radiology.  CT abdomen/pelvis was performed in the ER showing cholecystostomy tube within the gallbladder and partial distention around the tube with no significant fluid or fluid collection around the cholecystostomy tube. She was evaluated by radiology as well and drain was flushed with no difficulty.  She was recommended to continue daily drain irrigation and follow-up with drain clinic outpatient. Due to poor oral intake and nausea, she was unable to take antihypertensive medications at home prior to coming to the ER.  She continued to have elevated blood pressures and uncontrolled pain requiring multiple IV opioid doses she is admitted for further pain control and blood pressure treatment.

## 2023-03-24 NOTE — H&P (Signed)
 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 She was recently hospitalized from 03/09/2023 until 03/14/2023 due to acute cholecystitis and underwent PERC drain placement with radiology.  CT abdomen/pelvis was performed in the ER showing cholecystostomy tube within the gallbladder and partial distention around the tube with no significant fluid or fluid collection around the cholecystostomy tube. She was evaluated by radiology as well and drain was flushed with no difficulty.  She was recommended to continue daily drain irrigation and follow-up with drain clinic outpatient. Due to poor oral intake and nausea, she was unable to take antihypertensive medications at home prior to coming to the ER.  She continued to have elevated blood pressures and uncontrolled pain requiring multiple IV opioid doses she is admitted for further pain control and blood pressure treatment.  I have personally briefly reviewed patient's old medical records in [facility] and discussed patient with the ER provider when appropriate/indicated.  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 Meridee Score, 02/26/23) - s/p cholecystostomy tube placement with IR on 03/11/2023 -LFTs increased from prior values -Trend for now -IR flushed drain with no difficulty in the ER.  Continue drain flushes with 5 cc sterile saline daily  Hypokalemia - Infiltrated right arm IV in the ER with potassium runs -Tolerating oral at this time, being initiated back on oral repletion - BMP in a.m.    Code Status:     Code Status: Full Code  DVT Prophylaxis:      Anticipated disposition is to: Home   History: Past Medical History:  Diagnosis Date   Ascending aortic aneurysm (HCC) 12/16/2022   Benign essential hypertension 10/06/2017   Last Assessment & Plan:  Formatting of this note might be different from the original. Patient's blood pressure noted to be elevated at today's visit 188/98.  Patient will be provided with antihypertensives in the treatment center if needed in order to meet her Avastin parameters.  Patient recommended to follow up with her primary care physician regarding medication management.  Patient is not cur   Cancer [facility])    Change in bowel habits 06/28/2022   Colon cancer (HCC)    History of colon cancer 06/28/2022   Hypertension    Prolonged QT interval 01/07/2018   Renal calculi    bilateral   Sepsis due to undetermined organism (HCC) 12/24/2022   Sialoadenitis of submandibular gland 12/16/2022    Past Surgical History:  Procedure Laterality Date   BALLOON DILATION N/A 02/26/2023   Procedure: BALLOON DILATION;  Surgeon: Mansouraty,  Netty Starring., MD;  Location: [facility] ENDOSCOPY;  Service: Gastroenterology;  Laterality: N/A;   BILIARY BRUSHING  03/24/2022   Procedure: BILIARY BRUSHING;  Surgeon: Meridee Score Netty Starring., MD;  Location: [facility] ENDOSCOPY;  Service: Gastroenterology;;    BILIARY DILATION  03/24/2022   Procedure: BILIARY DILATION;  Surgeon: Lemar Lofty., MD;  Location: [facility] ENDOSCOPY;  Service: Gastroenterology;;   BILIARY STENT PLACEMENT  03/24/2022   Procedure: BILIARY STENT PLACEMENT;  Surgeon: Lemar Lofty., MD;  Location: [facility] ENDOSCOPY;  Service: Gastroenterology;;   BILIARY STENT PLACEMENT N/A 02/26/2023   Procedure: BILIARY STENT PLACEMENT;  Surgeon: Lemar Lofty., MD;  Location: [facility] ENDOSCOPY;  Service: Gastroenterology;  Laterality: N/A;   BIOPSY  03/24/2022   Procedure: BIOPSY;  Surgeon: Meridee Score Netty Starring., MD;  Location: [facility] ENDOSCOPY;  Service: Gastroenterology;;   BIOPSY  11/23/2022   Procedure: BIOPSY;  Surgeon: Hilarie Fredrickson, MD;  Location: [facility] ENDOSCOPY;  Service: Gastroenterology;;   BIOPSY  02/26/2023   Procedure: BIOPSY;  Surgeon: Lemar Lofty., MD;  Location: [facility] ENDOSCOPY;  Service: Gastroenterology;;   CESAREAN SECTION     x2   COLONOSCOPY N/A 11/23/2022   Procedure: COLONOSCOPY;  Surgeon: Hilarie Fredrickson, MD;  Location: [facility] ENDOSCOPY;  Service: Gastroenterology;  Laterality: N/A;   CYSTOSCOPY WITH RETROGRADE PYELOGRAM, URETEROSCOPY AND STENT PLACEMENT Right 10/22/2020   Procedure: CYSTOSCOPY WITH RETROGRADE PYELOGRAM, URETEROSCOPY AND STENT PLACEMENT;  Surgeon: Noel Christmas, MD;  Location: [facility] ORS;  Service: Urology;  Laterality: Right;   ENDOSCOPIC RETROGRADE CHOLANGIOPANCREATOGRAPHY (ERCP) WITH PROPOFOL N/A 02/26/2023   Procedure: ENDOSCOPIC RETROGRADE CHOLANGIOPANCREATOGRAPHY (ERCP) WITH PROPOFOL;  Surgeon: Meridee Score Netty Starring., MD;  Location: [facility] ENDOSCOPY;  Service: Gastroenterology;  Laterality: N/A;   ERCP N/A 03/24/2022   Procedure: ENDOSCOPIC RETROGRADE CHOLANGIOPANCREATOGRAPHY (ERCP);  Surgeon: Lemar Lofty., MD;  Location: [facility] ENDOSCOPY;  Service: Gastroenterology;  Laterality: N/A;   ESOPHAGOGASTRODUODENOSCOPY (EGD) WITH PROPOFOL N/A 02/26/2023   Procedure:  ESOPHAGOGASTRODUODENOSCOPY (EGD) WITH PROPOFOL;  Surgeon: Meridee Score Netty Starring., MD;  Location: [facility] ENDOSCOPY;  Service: Gastroenterology;  Laterality: N/A;  patient needs EGD in addition to ERCP   IR PATIENT EVAL TECH 0-60 MINS  12/15/2022   IR PERC CHOLECYSTOSTOMY  03/11/2023   IR RADIOLOGIST EVAL & MGMT  09/16/2019   IR RADIOLOGIST EVAL & MGMT  09/23/2019   IR RADIOLOGIST EVAL & MGMT  09/20/2019   IR RADIOLOGIST EVAL & MGMT  09/30/2019   IR REMOVAL TUN ACCESS W/ PORT W/O FL MOD SED  09/14/2019   IR VENO/EXT/UNI RIGHT  04/11/2022   PANCREATIC STENT PLACEMENT  03/24/2022   Procedure: PANCREATIC STENT PLACEMENT;  Surgeon: Lemar Lofty., MD;  Location: [facility] ENDOSCOPY;  Service: Gastroenterology;;   REMOVAL OF STONES  03/24/2022   Procedure: REMOVAL OF STONES;  Surgeon: Lemar Lofty., MD;  Location: [facility] ENDOSCOPY;  Service: Gastroenterology;;   REMOVAL OF STONES  02/26/2023   Procedure: REMOVAL OF Sludge;  Surgeon: Lemar Lofty., MD;  Location: [facility] ENDOSCOPY;  Service: Gastroenterology;;   Dennison Mascot  03/24/2022   Procedure: Dennison Mascot;  Surgeon: Lemar Lofty., MD;  Location: [facility] ENDOSCOPY;  Service: Gastroenterology;;   Francine Graven REMOVAL  02/26/2023   Procedure: STENT REMOVAL;  Surgeon: Lemar Lofty., MD;  Location: [facility] ENDOSCOPY;  Service: Gastroenterology;;   URETERAL STENT PLACEMENT       reports that she has quit smoking. Her smoking use included cigarettes. She has never used smokeless tobacco. She reports that she does not currently use alcohol. She reports that she does not  currently use drugs after having used the following drugs: Marijuana.  Allergies  Allergen Reactions   Lisinopril Swelling and Other (See Comments)    Lip swelling and Angioedema   Irinotecan Other (See Comments)    Stomach cramping Patient given 0.5 mL atropine IV and was able to continue infusion.  See Progress note on 10/03/20.    Leucovorin Calcium Itching    Patient  reported itchy palms towards end of transfusion, see progress note 08/04/22.    Oxaliplatin Itching    Patient reported itchy palms towards end of transfusion, see progress note on 08/04/22.    Family History  Problem Relation Age of Onset   Hypertension Mother    Diabetes Mellitus II Father    Stroke Father    Ulcers Sister    Colon cancer Neg Hx    Esophageal cancer Neg Hx    Inflammatory bowel disease Neg Hx    Liver disease Neg Hx    Pancreatic cancer Neg Hx    Rectal cancer Neg Hx    Stomach cancer Neg Hx     Home Medications: Prior to Admission medications   Medication Sig Start Date End Date Taking? Authorizing Provider  amLODipine (NORVASC) 10 MG tablet Take 1 tablet (10 mg total) by mouth daily. 11/24/22   Rodolph Bong, MD  dexamethasone (DECADRON) 4 MG tablet Take 1 tablet by mouth twice a day for 3 days beginning day 2 of chemo. Patient not taking: Reported on 03/17/2023 02/03/23   Rana Snare, NP  dicyclomine (BENTYL) 20 MG tablet Take 1 tablet (20 mg total) by mouth 3 (three) times daily as needed for spasms (rectal/AB pain). Patient taking differently: Take 20 mg by mouth 2 (two) times daily as needed for spasms (rectal/AB pain). 02/17/23   Ladene Artist, MD  HYDROmorphone (DILAUDID) 2 MG tablet Take 1 tablet (2 mg total) by mouth every 4 (four) hours as needed for severe pain (pain score 7-10) or moderate pain (pain score 4-6). 03/14/23   [place], Eric J, DO  lactulose (CHRONULAC) 10 GM/15ML solution Take 15-30 mLs by mouth daily as needed for mild constipation.    [provider]  linaclotide (LINZESS) 72 MCG capsule Take 72 mcg by mouth daily as needed (constipation). Patient not taking: Reported on 03/17/2023    [provider]  metoprolol tartrate (LOPRESSOR) 50 MG tablet Take 1 tablet (50 mg total) by mouth 2 (two) times daily. 11/24/22   Rodolph Bong, MD  ondansetron (ZOFRAN) 8 MG tablet Take 1 tablet (8 mg total) by mouth every 8  (eight) hours as needed for nausea or vomiting. Can start taking 72 hours after chemotherapy Patient not taking: Reported on 03/09/2023 02/03/23   Rana Snare, NP  pantoprazole (PROTONIX) 40 MG tablet Take 1 tablet (40 mg total) by mouth daily before breakfast. 02/27/23   Meredeth Ide, MD  polyethylene glycol (MIRALAX / GLYCOLAX) 17 g packet Take 17 g by mouth daily as needed. Patient taking differently: Take 17 g by mouth daily as needed for moderate constipation or mild constipation. 02/27/23   Meredeth Ide, MD  potassium chloride (MICRO-K) 10 MEQ CR capsule Take 2 capsules (20 mEq total) by mouth 2 (two) times daily. Patient taking differently: Take 30 mEq by mouth 2 (two) times daily. 11/24/22   Rodolph Bong, MD  prochlorperazine (COMPAZINE) 10 MG tablet Take 1 tablet (10 mg total) by mouth every 6 (six) hours as needed for nausea or vomiting. 02/03/23  Rana Snare, NP  Sodium Chloride Flush (NORMAL SALINE FLUSH) 0.9 % SOLN Use 5 mLs by Intracatheter route daily in biliary drain at 6 (six) AM as directed 03/14/23   [place], Alvira Philips, DO    Review of Systems:  Review of Systems  Constitutional: Negative.   HENT: Negative.    Eyes: Negative.   Respiratory: Negative.    Cardiovascular: Negative.   Gastrointestinal:  Positive for abdominal pain and nausea.  Genitourinary: Negative.   Musculoskeletal: Negative.   Skin: Negative.   Neurological: Negative.   Endo/Heme/Allergies: Negative.   Psychiatric/Behavioral: Negative.      Physical Exam:  Vitals:   03/24/23 1800 03/24/23 1900 03/24/23 2000 03/24/23 2001  BP: (!) 194/119 (!) 197/124 (!) 180/121 (!) 180/121  Pulse: 71 71 66   Resp: 13 14 16    Temp:      TempSrc:      SpO2: 99% 100% 100%    Physical Exam Constitutional:      Comments: Uncomfortable appearing  HENT:     Head: Normocephalic and atraumatic.     Mouth/Throat:     Mouth: Mucous membranes are moist.  Eyes:     Extraocular Movements: Extraocular movements  intact.  Cardiovascular:     Rate and Rhythm: Normal rate and regular rhythm.  Pulmonary:     Effort: Pulmonary effort is normal. No respiratory distress.     Breath sounds: Normal breath sounds. No wheezing.  Abdominal:     General: Bowel sounds are normal. There is no distension.     Palpations: Abdomen is soft.     Tenderness: There is abdominal tenderness (generalized, worse in right quadrants).  Musculoskeletal:        General: Normal range of motion.     Cervical back: Normal range of motion and neck supple.  Skin:    General: Skin is warm and dry.  Neurological:     General: No focal deficit present.     Mental Status: She is alert.  Psychiatric:        Mood and Affect: Mood normal.      Labs on Admission:  I have personally reviewed following labs and imaging studies Results for orders placed or performed during the hospital encounter of 03/24/23 (from the past 24 hours)  I-Stat CG4 Lactic Acid     Status: None   Collection Time: 03/24/23  4:03 PM  Result Value Ref Range   Lactic Acid, Venous 0.9 0.5 - 1.9 mmol/L  CBC with Differential     Status: Abnormal   Collection Time: 03/24/23  4:11 PM  Result Value Ref Range   WBC 8.0 4.0 - 10.5 K/uL   RBC 3.61 (L) 3.87 - 5.11 MIL/uL   Hemoglobin 10.9 (L) 12.0 - 15.0 g/dL   HCT 32.9 (L) 51.8 - 84.1 %   MCV 94.2 80.0 - 100.0 fL   MCH 30.2 26.0 - 34.0 pg   MCHC 32.1 30.0 - 36.0 g/dL   RDW 66.0 (H) 63.0 - 16.0 %   Platelets 468 (H) 150 - 400 K/uL   nRBC 0.0 0.0 - 0.2 %   Neutrophils Relative % 57 %   Neutro Abs 4.6 1.7 - 7.7 K/uL   Lymphocytes Relative 32 %   Lymphs Abs 2.6 0.7 - 4.0 K/uL   Monocytes Relative 8 %   Monocytes Absolute 0.6 0.1 - 1.0 K/uL   Eosinophils Relative 2 %   Eosinophils Absolute 0.2 0.0 - 0.5 K/uL   Basophils Relative 1 %  Basophils Absolute 0.1 0.0 - 0.1 K/uL   WBC Morphology MORPHOLOGY UNREMARKABLE    Immature Granulocytes 0 %   Abs Immature Granulocytes 0.03 0.00 - 0.07 K/uL   Comprehensive metabolic panel     Status: Abnormal   Collection Time: 03/24/23  4:11 PM  Result Value Ref Range   Sodium 134 (L) 135 - 145 mmol/L   Potassium 3.2 (L) 3.5 - 5.1 mmol/L   Chloride 94 (L) 98 - 111 mmol/L   CO2 29 22 - 32 mmol/L   Glucose, Bld 85 70 - 99 mg/dL   BUN 6 6 - 20 mg/dL   Creatinine, Ser 2.95 (L) 0.44 - 1.00 mg/dL   Calcium 9.3 8.9 - 62.1 mg/dL   Total Protein 8.7 (H) 6.5 - 8.1 g/dL   Albumin 3.5 3.5 - 5.0 g/dL   AST 67 (H) 15 - 41 U/L   ALT 52 (H) 0 - 44 U/L   Alkaline Phosphatase 690 (H) 38 - 126 U/L   Total Bilirubin 0.7 0.0 - 1.2 mg/dL   GFR, Estimated >30 >86 mL/min   Anion gap 11 5 - 15  Lipase, blood     Status: None   Collection Time: 03/24/23  4:11 PM  Result Value Ref Range   Lipase 27 11 - 51 U/L  Magnesium     Status: None   Collection Time: 03/24/23  4:11 PM  Result Value Ref Range   Magnesium 1.8 1.7 - 2.4 mg/dL  I-Stat CG4 Lactic Acid     Status: None   Collection Time: 03/24/23  5:47 PM  Result Value Ref Range   Lactic Acid, Venous 0.6 0.5 - 1.9 mmol/L  Urinalysis, Routine w reflex microscopic -Urine, Clean Catch     Status: Abnormal   Collection Time: 03/24/23  7:38 PM  Result Value Ref Range   Color, Urine STRAW (A) YELLOW   APPearance CLEAR CLEAR   Specific Gravity, Urine 1.034 (H) 1.005 - 1.030   pH 7.0 5.0 - 8.0   Glucose, UA NEGATIVE NEGATIVE mg/dL   Hgb urine dipstick NEGATIVE NEGATIVE   Bilirubin Urine NEGATIVE NEGATIVE   Ketones, ur 5 (A) NEGATIVE mg/dL   Protein, ur NEGATIVE NEGATIVE mg/dL   Nitrite NEGATIVE NEGATIVE   Leukocytes,Ua NEGATIVE NEGATIVE   *Note: Due to a large number of results and/or encounters for the requested time period, some results have not been displayed. A complete set of results can be found in Results Review.     Radiological Exams on Admission: CT ABDOMEN PELVIS W CONTRAST Result Date: 03/24/2023 CLINICAL DATA:  Acute abdominal pain.  Recent cholecystostomy. Review of electronic records  indicates history of rectal cancer. EXAM: CT ABDOMEN AND PELVIS WITH CONTRAST TECHNIQUE: Multidetector CT imaging of the abdomen and pelvis was performed using the standard protocol following bolus administration of intravenous contrast. RADIATION DOSE REDUCTION: This exam was performed according to the departmental dose-optimization program which includes automated exposure control, adjustment of the mA and/or kV according to patient size and/or use of iterative reconstruction technique. CONTRAST:  OMNIPAQUE IOHEXOL 300 MG/ML  SOLN COMPARISON:  Most recent CT 03/09/2023 FINDINGS: Lower chest: Linear atelectasis or scarring in the lung bases. No significant pleural effusion. Hepatobiliary: Cholecystostomy tube coiled in the gallbladder. Gallbladder is partially distended around the tube. Gallstones are noted. No significant fluid or fluid collection along the cholecystostomy tube. Again seen biliary stents. There is similar intrahepatic biliary ductal dilatation. Common bile duct is collapsed along the stents. Small amount of perihepatic  fluid which tracks along the liver dome. There is scalloping at the right dome of the liver, for example series 8, image 69. Low-density along the falciform ligament is unchanged. Scattered tiny hepatic hypodensities. Pancreas: No ductal dilatation or inflammation. Spleen: Normal in size without focal abnormality. Adrenals/Urinary Tract: No adrenal nodule. Bilateral nonobstructing intrarenal calculi. No hydronephrosis. Again seen right renal atrophy. Renal cyst need no further imaging follow-up. Decompressed urinary bladder. Stomach/Bowel: Again seen rectal wall thickening, patient with known neoplasm. Small to moderate volume of stool in the colon. Equivocal wall thickening of the cecum. Occasional colonic diverticula. No small bowel obstruction. Vascular/Lymphatic: Normal caliber abdominal aorta. No discrete filling defects in the portal vein. There is some mass effect on  the intrahepatic portal veins as before. No discretely enlarged lymph nodes. Reproductive: Not well assessed on the current exam. Other: Again seen peritoneal carcinomatosis with nodular omental thickening in the anterior lower abdomen. Reference omental nodule measuring 18 mm short axis series 2, image 47. Dose retrospectively measured 14 mm short axis. Some the anterior omental nodularity is less well defined due to adjacent bowel loops. Small amount of free fluid in the pelvis. No subcutaneous collection. Musculoskeletal: Bilateral L5 pars interarticularis defects no anterolisthesis of L5 on S1. No suspicious bone lesion. IMPRESSION: 1. Cholecystostomy tube coiled in the gallbladder. Gallbladder is partially distended around the tube. No significant fluid or fluid collection along the cholecystostomy tube. 2. Again seen biliary stents with similar intrahepatic biliary ductal dilatation. Common bile duct is collapsed along the stents. 3. Small amount of perihepatic fluid which tracks along the liver dome. There is scalloping at the right dome of the liver, consistent with known peritoneal carcinomatosis. 4. Peritoneal nodularity has progressed from prior exam particularly in the right lower quadrant. 5. Again seen rectal wall thickening, patient with known neoplasm. 6. Bilateral nonobstructing intrarenal calculi. 7. Small amount of free fluid in the pelvis. Electronically Signed   By: Narda Rutherford M.D.   On: 03/24/2023 19:23   CT ABDOMEN PELVIS W CONTRAST  Final Result      Consults called:     EKG: Independently reviewed. NSR   Lewie Chamber, MD [facility] 03/24/2023, 8:36 PM

## 2023-03-24 NOTE — Assessment & Plan Note (Signed)
-   repleted

## 2023-03-24 NOTE — Progress Notes (Signed)
 Patient ID: April [name], female   DOB: 1976/01/10, 48 y.o.   MRN: [id number] Asked by ED staff to evaluate patient's percutaneous cholecystostomy drain which was placed on 03/11/2023 (history of metastatic rectal cancer) secondary to acute cholecystitis.  Patient came to ED today with complaints of pain at drain site as well as leaking around drain.  Patient seen in ED with Dr. Milford Cage.  Patient afebrile, gauze dressing removed over gallbladder drain and insertion site appears clean and dry.  No purulent drainage from region.  Drain was flushed without difficulty with 10 cc sterile saline.  Recommend the patient continue once daily irrigation of drain with 5 cc sterile saline, record output and change overlying gauze dressing every 2 to 3 days.  Patient has follow-up cholangiogram scheduled on 04/20/2023.  Recommend the patient call IR directly at [phone] with any further questions regarding gallbladder drain.

## 2023-03-24 NOTE — ED Notes (Signed)
 Pt CT

## 2023-03-24 NOTE — ED Provider Notes (Signed)
 Patient was initially seen by Dr. Particia Nearing.  Please see her note patient has history of metastatic colon cancer.  She presented to the ED with recurrent pain.  Patient CT scan was pending at the time of shift change.  .Critical Care  Performed by: Linwood Dibbles, MD Authorized by: Linwood Dibbles, MD   Critical care provider statement:    Critical care time (minutes):  30   Critical care was time spent personally by me on the following activities:  Development of treatment plan with patient or surrogate, discussions with consultants, evaluation of patient's response to treatment, examination of patient, ordering and review of laboratory studies, ordering and review of radiographic studies, ordering and performing treatments and interventions, pulse oximetry, re-evaluation of patient's condition and review of old charts    Clinical Course as of 03/24/23 2007  Tue Mar 24, 2023  1824 I-Stat CG4 Lactic Acid Lactic acid level normal. [JK]  1825 CBC with Differential(!) Hemoglobin increased compared to previous. [JK]  1825 Comprehensive metabolic panel(!) Metabolic panel stable. [JK]  1855 CT ABDOMEN PELVIS W CONTRAST [JK]  1857 Blood pressure remains elevated.  Additional IV hydralazine ordered [JK]  1943 CT scan shows finding consistent with her known neoplasm.  No complications noted with her cholecystostomy tube [JK]  2005 Case discussed with Dr Frederick Peers [JK]    Clinical Course User Index [JK] Linwood Dibbles, MD   Patient CT scan does not show any acute abnormality.  It does show findings associated with her chronic cholecystostomy tube and malignancy.  While the patient was in the ED she was notably hypertensive.  She was given a dose of her oral blood pressure medications as well as IV hydralazine.  She remains hypertensive.  Patient has also required recurrent doses of pain medications.  Patient was initially ordered IV potassium for her hypokalemia.  Unfortunately there was extravasation.  I  have ordered hyaluronidase for the potassium extravasation.  With her persistent pain and discomfort hypertension I will consult the medical service for admission and further treatment   Linwood Dibbles, MD 03/24/23 2007

## 2023-03-24 NOTE — Assessment & Plan Note (Signed)
-   Suspect multifactorial mostly from uncontrolled pain and missing blood pressure meds at home prior to hospitalization - Resume amlodipine and Lopressor - PCP note reviewed; was started on clonidine recently also; patient did not remember on admission; will resume now and monitor response  - Labetalol and hydralazine as needed - Working on pain control as well - continued on clonidine 0.1 mg TID at discharge with instructions she may need to increase to 0.2 mg if necessary; she plans to obtain a new arm BP cuff for home use as well

## 2023-03-24 NOTE — ED Triage Notes (Signed)
 Pt BIBA from home for worsening abd pain for a few days. Had gallbladder surgery last week, noticed white discharge coming into drainage bag with some red tinge this morning and pain got worse. Seems ot be oozing around drain.Taking home meds regularly vs prn  164/122 HR 80 99% RA

## 2023-03-24 NOTE — Telephone Encounter (Signed)
 Patient called and report that her cholecystostomy tube had pinky puss in the tube. She notice the puss when flushing the tube. She states she is having chills and sweating and she is hot. She want to know it she need to go to the ed. I advise the patient to called IR and I will also follow up with IR about the puss in the tube.

## 2023-03-25 ENCOUNTER — Encounter (HOSPITAL_COMMUNITY): Payer: Self-pay | Admitting: Internal Medicine

## 2023-03-25 DIAGNOSIS — E876 Hypokalemia: Secondary | ICD-10-CM | POA: Diagnosis present

## 2023-03-25 DIAGNOSIS — I7121 Aneurysm of the ascending aorta, without rupture: Secondary | ICD-10-CM | POA: Diagnosis present

## 2023-03-25 DIAGNOSIS — Z8249 Family history of ischemic heart disease and other diseases of the circulatory system: Secondary | ICD-10-CM | POA: Diagnosis not present

## 2023-03-25 DIAGNOSIS — Z87891 Personal history of nicotine dependence: Secondary | ICD-10-CM | POA: Diagnosis not present

## 2023-03-25 DIAGNOSIS — E860 Dehydration: Secondary | ICD-10-CM | POA: Diagnosis present

## 2023-03-25 DIAGNOSIS — I160 Hypertensive urgency: Secondary | ICD-10-CM | POA: Diagnosis present

## 2023-03-25 DIAGNOSIS — Z823 Family history of stroke: Secondary | ICD-10-CM | POA: Diagnosis not present

## 2023-03-25 DIAGNOSIS — K831 Obstruction of bile duct: Secondary | ICD-10-CM | POA: Diagnosis present

## 2023-03-25 DIAGNOSIS — D630 Anemia in neoplastic disease: Secondary | ICD-10-CM | POA: Diagnosis present

## 2023-03-25 DIAGNOSIS — C787 Secondary malignant neoplasm of liver and intrahepatic bile duct: Secondary | ICD-10-CM | POA: Diagnosis present

## 2023-03-25 DIAGNOSIS — Z833 Family history of diabetes mellitus: Secondary | ICD-10-CM | POA: Diagnosis not present

## 2023-03-25 DIAGNOSIS — Z888 Allergy status to other drugs, medicaments and biological substances status: Secondary | ICD-10-CM | POA: Diagnosis not present

## 2023-03-25 DIAGNOSIS — Z79899 Other long term (current) drug therapy: Secondary | ICD-10-CM | POA: Diagnosis not present

## 2023-03-25 DIAGNOSIS — C786 Secondary malignant neoplasm of retroperitoneum and peritoneum: Secondary | ICD-10-CM | POA: Diagnosis present

## 2023-03-25 DIAGNOSIS — C187 Malignant neoplasm of sigmoid colon: Secondary | ICD-10-CM | POA: Diagnosis present

## 2023-03-25 DIAGNOSIS — R112 Nausea with vomiting, unspecified: Secondary | ICD-10-CM | POA: Diagnosis present

## 2023-03-25 DIAGNOSIS — J439 Emphysema, unspecified: Secondary | ICD-10-CM | POA: Diagnosis present

## 2023-03-25 DIAGNOSIS — I10 Essential (primary) hypertension: Secondary | ICD-10-CM | POA: Diagnosis present

## 2023-03-25 DIAGNOSIS — Z85048 Personal history of other malignant neoplasm of rectum, rectosigmoid junction, and anus: Secondary | ICD-10-CM | POA: Diagnosis not present

## 2023-03-25 DIAGNOSIS — G893 Neoplasm related pain (acute) (chronic): Secondary | ICD-10-CM | POA: Diagnosis present

## 2023-03-25 LAB — CBC WITH DIFFERENTIAL/PLATELET
Abs Immature Granulocytes: 0.04 10*3/uL (ref 0.00–0.07)
Basophils Absolute: 0.1 10*3/uL (ref 0.0–0.1)
Basophils Relative: 1 %
Eosinophils Absolute: 0.1 10*3/uL (ref 0.0–0.5)
Eosinophils Relative: 1 %
HCT: 32.8 % — ABNORMAL LOW (ref 36.0–46.0)
Hemoglobin: 10.2 g/dL — ABNORMAL LOW (ref 12.0–15.0)
Immature Granulocytes: 0 %
Lymphocytes Relative: 20 %
Lymphs Abs: 1.9 10*3/uL (ref 0.7–4.0)
MCH: 29.5 pg (ref 26.0–34.0)
MCHC: 31.1 g/dL (ref 30.0–36.0)
MCV: 94.8 fL (ref 80.0–100.0)
Monocytes Absolute: 0.7 10*3/uL (ref 0.1–1.0)
Monocytes Relative: 7 %
Neutro Abs: 7.1 10*3/uL (ref 1.7–7.7)
Neutrophils Relative %: 71 %
Platelets: 438 10*3/uL — ABNORMAL HIGH (ref 150–400)
RBC: 3.46 MIL/uL — ABNORMAL LOW (ref 3.87–5.11)
RDW: 17.3 % — ABNORMAL HIGH (ref 11.5–15.5)
WBC: 9.8 10*3/uL (ref 4.0–10.5)
nRBC: 0 % (ref 0.0–0.2)

## 2023-03-25 LAB — COMPREHENSIVE METABOLIC PANEL
ALT: 44 U/L (ref 0–44)
AST: 69 U/L — ABNORMAL HIGH (ref 15–41)
Albumin: 3.2 g/dL — ABNORMAL LOW (ref 3.5–5.0)
Alkaline Phosphatase: 667 U/L — ABNORMAL HIGH (ref 38–126)
Anion gap: 13 (ref 5–15)
BUN: 5 mg/dL — ABNORMAL LOW (ref 6–20)
CO2: 24 mmol/L (ref 22–32)
Calcium: 9.2 mg/dL (ref 8.9–10.3)
Chloride: 99 mmol/L (ref 98–111)
Creatinine, Ser: 0.39 mg/dL — ABNORMAL LOW (ref 0.44–1.00)
GFR, Estimated: >60 mL/min (ref >60)
Glucose, Bld: 112 mg/dL — ABNORMAL HIGH (ref 70–99)
Potassium: 3.5 mmol/L (ref 3.5–5.1)
Sodium: 136 mmol/L (ref 135–145)
Total Bilirubin: 0.5 mg/dL (ref 0.0–1.2)
Total Protein: 7.4 g/dL (ref 6.5–8.1)

## 2023-03-25 LAB — MAGNESIUM: Magnesium: 1.7 mg/dL (ref 1.7–2.4)

## 2023-03-25 LAB — HIV ANTIBODY (ROUTINE TESTING W REFLEX): HIV Screen 4th Generation wRfx: NONREACTIVE

## 2023-03-25 MED ORDER — CLONIDINE HCL 0.1 MG PO TABS
0.1000 mg | ORAL_TABLET | Freq: Three times a day (TID) | ORAL | Status: DC
  Administered 2023-03-25 (×2): 0.1 mg via ORAL
  Filled 2023-03-25 (×2): qty 1

## 2023-03-25 MED ORDER — ACETAMINOPHEN 325 MG PO TABS: 650.0000 mg | ORAL_TABLET | Freq: Four times a day (QID) | ORAL | Status: DC | PRN

## 2023-03-25 MED ORDER — CHLORHEXIDINE GLUCONATE CLOTH 2 % EX PADS
6.0000 | MEDICATED_PAD | Freq: Every day | CUTANEOUS | Status: DC
  Administered 2023-03-25 – 2023-03-26 (×2): 6 via TOPICAL

## 2023-03-25 MED ORDER — DILTIAZEM HCL 25 MG/5ML IV SOLN
20.0000 mg | Freq: Once | INTRAVENOUS | Status: AC
  Administered 2023-03-25: 20 mg via INTRAVENOUS
  Filled 2023-03-25: qty 5

## 2023-03-25 MED ORDER — SODIUM CHLORIDE 0.9% FLUSH: 10.0000 mL | INTRAVENOUS | Status: DC | PRN

## 2023-03-25 MED ORDER — DICYCLOMINE HCL 20 MG PO TABS: 20.0000 mg | ORAL_TABLET | Freq: Two times a day (BID) | ORAL | Status: DC | PRN

## 2023-03-25 MED ORDER — METOPROLOL TARTRATE 50 MG PO TABS
50.0000 mg | ORAL_TABLET | Freq: Two times a day (BID) | ORAL | Status: DC
Start: 2023-03-25 — End: 2023-03-26
  Administered 2023-03-25 – 2023-03-26 (×3): 50 mg via ORAL
  Filled 2023-03-25 (×3): qty 1

## 2023-03-25 MED ORDER — DICYCLOMINE HCL 20 MG PO TABS
20.0000 mg | ORAL_TABLET | Freq: Three times a day (TID) | ORAL | Status: DC
  Administered 2023-03-25 – 2023-03-26 (×3): 20 mg via ORAL
  Filled 2023-03-25 (×5): qty 1

## 2023-03-25 MED ORDER — AMLODIPINE BESYLATE 5 MG PO TABS
10.0000 mg | ORAL_TABLET | Freq: Every day | ORAL | Status: DC
  Administered 2023-03-25 – 2023-03-26 (×2): 10 mg via ORAL
  Filled 2023-03-25 (×2): qty 2

## 2023-03-25 MED ORDER — ACETAMINOPHEN 650 MG RE SUPP: 650.0000 mg | Freq: Four times a day (QID) | RECTAL | Status: DC | PRN

## 2023-03-25 MED ORDER — SODIUM CHLORIDE 0.9% FLUSH
10.0000 mL | Freq: Two times a day (BID) | INTRAVENOUS | Status: DC
  Administered 2023-03-25 – 2023-03-26 (×3): 10 mL

## 2023-03-25 NOTE — Progress Notes (Signed)
   03/25/23 0915  TOC Brief Assessment  Insurance and Status Reviewed  Patient has primary care physician Yes  Home environment has been reviewed Resides in single family home  Prior level of function: Independent with ADLs at baseline  Prior/Current Home Services No current home services  Social Drivers of Health Review SDOH reviewed no interventions necessary  Readmission risk has been reviewed Yes  Transition of care needs no transition of care needs at this time

## 2023-03-25 NOTE — Care Management Obs Status (Signed)
 MEDICARE OBSERVATION STATUS NOTIFICATION   Patient Details  Name: April [name] MRN: [id number] Date of Birth: May 09, 1975   Medicare Observation Status Notification Given:  Yes    Ewing Schlein, LCSW 03/25/2023, 3:22 PM

## 2023-03-25 NOTE — Progress Notes (Signed)
 Progress Note    April [name]   ZOX:[id number]  DOB: Oct 26, 1975  DOA: 03/24/2023     0 PCP: Tollie Eth, NP  Initial CC: Abdominal pain  Hospital Course: 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 She was recently hospitalized from 03/09/2023 until 03/14/2023 due to acute cholecystitis and underwent PERC drain placement with radiology.  CT abdomen/pelvis was performed in the ER showing cholecystostomy tube within the gallbladder and partial distention around the tube with no significant fluid or fluid collection around the cholecystostomy tube. She was evaluated by radiology as well and drain was flushed with no difficulty.  She was recommended to continue daily drain irrigation and follow-up with drain clinic outpatient. Due to poor oral intake and nausea, she was unable to take antihypertensive medications at home prior to coming to the ER.  She continued to have elevated blood pressures and uncontrolled pain requiring multiple IV opioid doses she is admitted for further pain control and blood pressure treatment.  Interval History:  Still having uncontrolled blood pressure this morning but not given much treatment overnight unfortunately. Reviewed blood pressure regimen further with patient this morning and she was also started on clonidine outpatient, which we resumed during conversation. Still requiring IV Dilaudid and she wishes to continue on that for today as well.  Assessment and Plan: * Hypertensive urgency - Suspect multifactorial mostly from uncontrolled pain and missing blood pressure meds at home prior to hospitalization - Resume amlodipine and Lopressor - PCP note reviewed; was started on clonidine  recently also; patient did not remember on admission; will resume now and monitor response  - Labetalol and hydralazine as needed - Working on pain control as well  Cancer related pain - Uncontrolled with oral Dilaudid at home - Has responded fairly well to Dilaudid IV - Continue on IV Dilaudid and will try to transition back to home oral Dilaudid tomorrow  Biliary obstruction - 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 Meridee Score, 02/26/23) - s/p cholecystostomy tube placement with IR on 03/11/2023 -LFTs increased from prior values -Trend for now -IR flushed drain with no difficulty in the ER.  Continue drain flushes with 5 cc sterile saline daily  Hypokalemia - Infiltrated right arm IV in the ER with potassium runs -Tolerating oral at this time, being initiated back on oral repletion - BMP in a.m.   Old records reviewed in assessment of this patient  Antimicrobials:   DVT prophylaxis:     Code Status:   Code Status: Full Code  Mobility Assessment (Last 72 Hours)     Mobility Assessment     Row Name 03/25/23 1131 03/25/23 0555         Does patient have an order for bedrest or is patient medically unstable No - Continue assessment No - Continue assessment      What is the highest level of mobility based on the progressive mobility assessment? Level 6 (Walks independently in room and hall) - Balance while walking in room without assist - Complete Level 5 (Walks with assist in room/hall) - Balance while stepping forward/back and can walk in room with assist - Complete  Barriers to discharge: none Disposition Plan: Home HH orders placed:  Status is: Inpatient  Objective: Blood pressure (!) 141/103, pulse (!) 125, temperature 98.7 F (37.1 C), temperature source Oral, resp. rate 20, height 5\' 4"  (1.626 m), weight 57.2 kg, last menstrual period 01/07/2006, SpO2 100%.   Examination:  Physical Exam Constitutional:      Comments: Uncomfortable appearing  HENT:     Head: Normocephalic and atraumatic.     Mouth/Throat:     Mouth: Mucous membranes are moist.  Eyes:     Extraocular Movements: Extraocular movements intact.  Cardiovascular:     Rate and Rhythm: Normal rate and regular rhythm.  Pulmonary:     Effort: Pulmonary effort is normal. No respiratory distress.     Breath sounds: Normal breath sounds. No wheezing.  Abdominal:     General: Bowel sounds are normal. There is no distension.     Palpations: Abdomen is soft.     Tenderness: There is abdominal tenderness (generalized, worse in right quadrants).  Musculoskeletal:        General: Normal range of motion.     Cervical back: Normal range of motion and neck supple.  Skin:    General: Skin is warm and dry.  Neurological:     General: No focal deficit present.     Mental Status: She is alert.  Psychiatric:        Mood and Affect: Mood normal.      Consultants:    Procedures:    Data Reviewed: Results for orders placed or performed during the hospital encounter of 03/24/23 (from the past 24 hours)  Urinalysis, Routine w reflex microscopic -Urine, Clean Catch     Status: Abnormal   Collection Time: 03/24/23  7:38 PM  Result Value Ref Range   Color, Urine STRAW (A) YELLOW   APPearance CLEAR CLEAR   Specific Gravity, Urine 1.034 (H) 1.005 - 1.030   pH 7.0 5.0 - 8.0   Glucose, UA NEGATIVE NEGATIVE mg/dL   Hgb urine dipstick NEGATIVE NEGATIVE   Bilirubin Urine NEGATIVE NEGATIVE   Ketones, ur 5 (A) NEGATIVE mg/dL   Protein, ur NEGATIVE NEGATIVE mg/dL   Nitrite NEGATIVE NEGATIVE   Leukocytes,Ua NEGATIVE NEGATIVE  CBC with Differential/Platelet     Status: Abnormal   Collection Time: 03/25/23  6:56 AM  Result Value Ref Range   WBC 9.8 4.0 - 10.5 K/uL   RBC 3.46 (L) 3.87 - 5.11 MIL/uL   Hemoglobin 10.2 (L) 12.0 - 15.0 g/dL   HCT 16.1 (L) 09.6 - 04.5 %   MCV 94.8 80.0 - 100.0 fL    MCH 29.5 26.0 - 34.0 pg   MCHC 31.1 30.0 - 36.0 g/dL   RDW 40.9 (H) 81.1 - 91.4 %   Platelets 438 (H) 150 - 400 K/uL   nRBC 0.0 0.0 - 0.2 %   Neutrophils Relative % 71 %   Neutro Abs 7.1 1.7 - 7.7 K/uL   Lymphocytes Relative 20 %   Lymphs Abs 1.9 0.7 - 4.0 K/uL   Monocytes Relative 7 %   Monocytes Absolute 0.7 0.1 - 1.0 K/uL   Eosinophils Relative 1 %   Eosinophils Absolute 0.1 0.0 - 0.5 K/uL   Basophils Relative 1 %   Basophils Absolute 0.1 0.0 - 0.1 K/uL   Immature Granulocytes 0 %   Abs Immature Granulocytes 0.04 0.00 - 0.07 K/uL  Comprehensive metabolic panel     Status: Abnormal   Collection Time: 03/25/23  6:56 AM  Result Value Ref Range   Sodium 136 135 - 145 mmol/L   Potassium 3.5 3.5 - 5.1 mmol/L   Chloride 99 98 - 111 mmol/L   CO2 24 22 - 32 mmol/L   Glucose, Bld 112 (H) 70 - 99 mg/dL   BUN 5 (L) 6 - 20 mg/dL   Creatinine, Ser 4.09 (L) 0.44 - 1.00 mg/dL   Calcium 9.2 8.9 - 81.1 mg/dL   Total Protein 7.4 6.5 - 8.1 g/dL   Albumin 3.2 (L) 3.5 - 5.0 g/dL   AST 69 (H) 15 - 41 U/L   ALT 44 0 - 44 U/L   Alkaline Phosphatase 667 (H) 38 - 126 U/L   Total Bilirubin 0.5 0.0 - 1.2 mg/dL   GFR, Estimated >91 >47 mL/min   Anion gap 13 5 - 15  Magnesium     Status: None   Collection Time: 03/25/23  6:56 AM  Result Value Ref Range   Magnesium 1.7 1.7 - 2.4 mg/dL  HIV Antibody (routine testing w rflx)     Status: None   Collection Time: 03/25/23  6:56 AM  Result Value Ref Range   HIV Screen 4th Generation wRfx Non Reactive Non Reactive   *Note: Due to a large number of results and/or encounters for the requested time period, some results have not been displayed. A complete set of results can be found in Results Review.    I have reviewed pertinent nursing notes, vitals, labs, and images as necessary. I have ordered labwork to follow up on as indicated.  I have reviewed the last notes from staff over past 24 hours. I have discussed patient's care plan and test results  with nursing staff, CM/SW, and other staff as appropriate.  Time spent: Greater than 50% of the 55 minute visit was spent in counseling/coordination of care for the patient as laid out in the A&P.   LOS: 0 days   Lewie Chamber, MD [facility] 03/25/2023, 6:02 PM

## 2023-03-26 ENCOUNTER — Inpatient Hospital Stay: Admitting: Nurse Practitioner

## 2023-03-26 ENCOUNTER — Inpatient Hospital Stay

## 2023-03-26 ENCOUNTER — Other Ambulatory Visit (HOSPITAL_COMMUNITY): Payer: Self-pay

## 2023-03-26 DIAGNOSIS — E876 Hypokalemia: Secondary | ICD-10-CM | POA: Diagnosis not present

## 2023-03-26 DIAGNOSIS — I160 Hypertensive urgency: Secondary | ICD-10-CM | POA: Diagnosis not present

## 2023-03-26 DIAGNOSIS — G893 Neoplasm related pain (acute) (chronic): Secondary | ICD-10-CM | POA: Diagnosis not present

## 2023-03-26 DIAGNOSIS — K831 Obstruction of bile duct: Secondary | ICD-10-CM | POA: Diagnosis not present

## 2023-03-26 MED ORDER — CLONIDINE HCL 0.1 MG PO TABS
0.1000 mg | ORAL_TABLET | Freq: Three times a day (TID) | ORAL | 2 refills | Status: DC
  Filled 2023-03-26: qty 90, 30d supply, fill #0

## 2023-03-26 MED ORDER — ORAL CARE MOUTH RINSE: 15.0000 mL | OROMUCOSAL | Status: DC | PRN

## 2023-03-26 MED ORDER — HYDROMORPHONE HCL 4 MG PO TABS
4.0000 mg | ORAL_TABLET | ORAL | 0 refills | Status: DC | PRN
  Filled 2023-03-26: qty 30, 6d supply, fill #0

## 2023-03-26 MED ORDER — CLONIDINE HCL 0.1 MG PO TABS: 0.1000 mg | ORAL_TABLET | Freq: Three times a day (TID) | ORAL | Status: DC

## 2023-03-26 MED ORDER — CLONIDINE HCL 0.2 MG PO TABS
0.2000 mg | ORAL_TABLET | Freq: Three times a day (TID) | ORAL | Status: DC
  Administered 2023-03-26: 0.2 mg via ORAL
  Filled 2023-03-26: qty 1

## 2023-03-26 NOTE — Progress Notes (Signed)
 Medications for [facility] outpatient pharmacy delivered to patient by this RN

## 2023-03-26 NOTE — Discharge Summary (Signed)
 Physician Discharge Summary   April [name] GNF:[id number] DOB: 03-05-1975 DOA: 03/24/2023  PCP: Tollie Eth, NP  Admit date: 03/24/2023 Discharge date: 03/26/2023   Admitted From: Home Disposition:  Home Discharging physician: Lewie Chamber, MD Barriers to discharge: none  Recommendations at discharge: Follow up BP log; may need further clonidine adjustment Follow up with IR drain clinic  Discharge Condition: stable CODE STATUS: Full  Diet recommendation:  Diet Orders (From admission, onward)     Start     Ordered   03/26/23 0000  Diet general        03/26/23 0928   03/24/23 2024  Diet regular Fluid consistency: Thin  Diet effective now       Question:  Fluid consistency:  Answer:  Thin   03/24/23 2023            Hospital Course: April [name] is a 48 yo female with PMH 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 She was recently hospitalized from 03/09/2023 until 03/14/2023 due to acute cholecystitis and underwent PERC drain placement with radiology.  CT abdomen/pelvis was performed in the ER showing cholecystostomy tube within the gallbladder and partial distention around the tube with no significant fluid or fluid collection around the cholecystostomy tube. She was evaluated by radiology as well and drain was flushed with no difficulty.  She was recommended to continue daily drain irrigation and follow-up with drain clinic outpatient. Due to poor oral intake and nausea, she was unable to take antihypertensive medications at home prior to coming to the ER.  She continued to have elevated blood pressures and uncontrolled pain requiring multiple IV opioid doses she is admitted for further pain control and blood pressure treatment.  Assessment and  Plan: * Hypertensive urgency - Suspect multifactorial mostly from uncontrolled pain and missing blood pressure meds at home prior to hospitalization - Resume amlodipine and Lopressor - PCP note reviewed; was started on clonidine recently also; patient did not remember on admission; will resume now and monitor response  - Labetalol and hydralazine as needed - Working on pain control as well - continued on clonidine 0.1 mg TID at discharge with instructions she may need to increase to 0.2 mg if necessary; she plans to obtain a new arm BP cuff for home use as well   Cancer related pain - Uncontrolled with oral Dilaudid at home - Has responded fairly well to Dilaudid IV - transitioned back to oral dilaudid  Biliary obstruction - 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 Meridee Score, 02/26/23) - s/p cholecystostomy tube placement with IR on 03/11/2023 -LFTs increased from prior values; downtrended  -IR flushed drain with no difficulty in the ER.  Continue drain flushes with 5 cc sterile saline daily  Hypokalemia - repleted     The patient's acute and chronic medical conditions were treated accordingly. On day of discharge, patient was felt deemed stable for discharge. Patient/family member advised to call PCP or come back to ER if needed.   Principal Diagnosis: Hypertensive urgency  Discharge Diagnoses: Active Hospital Problems   Diagnosis Date Noted   Hypertensive urgency 03/24/2023    Priority: 1.   Cancer related pain 11/19/2022    Priority: 2.   Biliary obstruction 03/23/2022   Hypokalemia 01/07/2018  Resolved Hospital Problems  No resolved problems to display.     Discharge Instructions     Diet general   Complete by: As directed    Increase activity slowly   Complete by: As directed       Allergies as of 03/26/2023       Reactions   Lisinopril Swelling, Other (See Comments)    Lip swelling and Angioedema   Irinotecan Other (See Comments)   Stomach cramping Patient given 0.5 mL atropine IV and was able to continue infusion.  See Progress note on 10/03/20.    Leucovorin Calcium Itching   Patient reported itchy palms towards end of transfusion, see progress note 08/04/22.   Oxaliplatin Itching   Patient reported itchy palms towards end of transfusion, see progress note on 08/04/22.        Medication List     STOP taking these medications    ondansetron 8 MG tablet Commonly known as: ZOFRAN       TAKE these medications    amLODipine 10 MG tablet Commonly known as: NORVASC Take 1 tablet (10 mg total) by mouth daily.   cloNIDine 0.1 MG tablet Commonly known as: CATAPRES Take 1 tablet (0.1 mg total) by mouth 3 (three) times daily.   dexamethasone 4 MG tablet Commonly known as: DECADRON Take 1 tablet by mouth twice a day for 3 days beginning day 2 of chemo.   dicyclomine 20 MG tablet Commonly known as: BENTYL Take 1 tablet (20 mg total) by mouth 3 (three) times daily as needed for spasms (rectal/AB pain). What changed: when to take this   HYDROmorphone 4 MG tablet Commonly known as: DILAUDID Take 1 tablet (4 mg total) by mouth every 4 (four) hours as needed for severe pain (pain score 7-10) or moderate pain (pain score 4-6). What changed:  medication strength how much to take   lactulose 10 GM/15ML solution Commonly known as: CHRONULAC Take 15-30 mLs by mouth daily as needed for mild constipation.   linaclotide 72 MCG capsule Commonly known as: LINZESS Take 72 mcg by mouth daily as needed (constipation).   metoprolol tartrate 50 MG tablet Commonly known as: LOPRESSOR Take 1 tablet (50 mg total) by mouth 2 (two) times daily.   Normal Saline Flush 0.9 % Soln Use 5 mLs by Intracatheter route daily in biliary drain at 6 (six) AM as directed   pantoprazole 40 MG tablet Commonly known as: PROTONIX Take 1 tablet (40 mg total) by mouth daily  before breakfast.   polyethylene glycol 17 g packet Commonly known as: MIRALAX / GLYCOLAX Take 17 g by mouth daily as needed. What changed: reasons to take this   potassium chloride 10 MEQ CR capsule Commonly known as: MICRO-K Take 2 capsules (20 mEq total) by mouth 2 (two) times daily. What changed: how much to take   prochlorperazine 10 MG tablet Commonly known as: COMPAZINE Take 1 tablet (10 mg total) by mouth every 6 (six) hours as needed for nausea or vomiting.        Allergies  Allergen Reactions   Lisinopril Swelling and Other (See Comments)    Lip swelling and Angioedema   Irinotecan Other (See Comments)    Stomach cramping Patient given 0.5 mL atropine IV and was able to continue infusion.  See Progress note on 10/03/20.    Leucovorin Calcium Itching    Patient reported itchy palms towards end of transfusion, see progress note 08/04/22.    Oxaliplatin Itching    Patient  reported itchy palms towards end of transfusion, see progress note on 08/04/22.    Consultations:   Procedures:   Discharge Exam: BP (!) 129/90   Pulse 93   Temp 98 F (36.7 C) (Oral)   Resp 13   Ht 5\' 4"  (1.626 m)   Wt 57.2 kg   LMP 01/07/2006   SpO2 100%   BMI 21.65 kg/m  Physical Exam Constitutional:      Comments: Uncomfortable appearing  HENT:     Head: Normocephalic and atraumatic.     Mouth/Throat:     Mouth: Mucous membranes are moist.  Eyes:     Extraocular Movements: Extraocular movements intact.  Cardiovascular:     Rate and Rhythm: Normal rate and regular rhythm.  Pulmonary:     Effort: Pulmonary effort is normal. No respiratory distress.     Breath sounds: Normal breath sounds. No wheezing.  Abdominal:     General: Bowel sounds are normal. There is no distension.     Palpations: Abdomen is soft.     Tenderness: There is abdominal tenderness (generalized, worse in right quadrants).  Musculoskeletal:        General: Normal range of motion.     Cervical back:  Normal range of motion and neck supple.  Skin:    General: Skin is warm and dry.  Neurological:     General: No focal deficit present.     Mental Status: She is alert.  Psychiatric:        Mood and Affect: Mood normal.      The results of significant diagnostics from this hospitalization (including imaging, microbiology, ancillary and laboratory) are listed below for reference.   Microbiology: Recent Results (from the past 240 hours)  Culture, blood (routine x 2)     Status: None (Preliminary result)   Collection Time: 03/24/23  5:00 PM   Specimen: BLOOD LEFT FOREARM  Result Value Ref Range Status   Specimen Description   Final    BLOOD LEFT FOREARM Performed at [facility], [address] [place], [place] [address]    Special Requests   Final    BOTTLES DRAWN AEROBIC AND ANAEROBIC Blood Culture results may not be optimal due to an inadequate volume of blood received in culture bottles Performed at [facility], [address], [place], [place] [address]    Culture   Final    NO GROWTH 2 DAYS Performed at [facility], [address] [place], [place] [address]    Report Status PENDING  Incomplete     Labs: BNP (last 3 results) No results for input(s): "BNP" in the last 8760 hours. Basic Metabolic Panel: Recent Labs  Lab 03/24/23 1611 03/25/23 0656  NA 134* 136  K 3.2* 3.5  CL 94* 99  CO2 29 24  GLUCOSE 85 112*  BUN 6 5*  CREATININE 0.32* 0.39*  CALCIUM 9.3 9.2  MG 1.8 1.7   Liver Function Tests: Recent Labs  Lab 03/24/23 1611 03/25/23 0656  AST 67* 69*  ALT 52* 44  ALKPHOS 690* 667*  BILITOT 0.7 0.5  PROT 8.7* 7.4  ALBUMIN 3.5 3.2*   Recent Labs  Lab 03/24/23 1611  LIPASE 27   No results for input(s): "AMMONIA" in the last 168 hours. CBC: Recent Labs  Lab 03/24/23 1611 03/25/23 0656  WBC 8.0 9.8  NEUTROABS 4.6 7.1  HGB 10.9* 10.2*  HCT 34.0* 32.8*  MCV 94.2 94.8  PLT 468* 438*  Cardiac Enzymes: No  results for input(s): "CKTOTAL", "CKMB", "CKMBINDEX", "TROPONINI" in the last 168 hours. BNP: Invalid input(s): "POCBNP" CBG: No results for input(s): "GLUCAP" in the last 168 hours. D-Dimer No results for input(s): "DDIMER" in the last 72 hours. Hgb A1c No results for input(s): "HGBA1C" in the last 72 hours. Lipid Profile No results for input(s): "CHOL", "HDL", "LDLCALC", "TRIG", "CHOLHDL", "LDLDIRECT" in the last 72 hours. Thyroid function studies No results for input(s): "TSH", "T4TOTAL", "T3FREE", "THYROIDAB" in the last 72 hours.  Invalid input(s): "FREET3" Anemia work up No results for input(s): "VITAMINB12", "FOLATE", "FERRITIN", "TIBC", "IRON", "RETICCTPCT" in the last 72 hours. Urinalysis    Component Value Date/Time   COLORURINE STRAW (A) 03/24/2023 1938   APPEARANCEUR CLEAR 03/24/2023 1938   LABSPEC 1.034 (H) 03/24/2023 1938   PHURINE 7.0 03/24/2023 1938   GLUCOSEU NEGATIVE 03/24/2023 1938   HGBUR NEGATIVE 03/24/2023 1938   BILIRUBINUR NEGATIVE 03/24/2023 1938   KETONESUR 5 (A) 03/24/2023 1938   PROTEINUR NEGATIVE 03/24/2023 1938   NITRITE NEGATIVE 03/24/2023 1938   LEUKOCYTESUR NEGATIVE 03/24/2023 1938   Sepsis Labs Recent Labs  Lab 03/24/23 1611 03/25/23 0656  WBC 8.0 9.8   Microbiology Recent Results (from the past 240 hours)  Culture, blood (routine x 2)     Status: None (Preliminary result)   Collection Time: 03/24/23  5:00 PM   Specimen: BLOOD LEFT FOREARM  Result Value Ref Range Status   Specimen Description   Final    BLOOD LEFT FOREARM Performed at [facility], [address] [place], [place] [address]    Special Requests   Final    BOTTLES DRAWN AEROBIC AND ANAEROBIC Blood Culture results may not be optimal due to an inadequate volume of blood received in culture bottles Performed at [facility], [address], [place], [place] [address]    Culture   Final    NO GROWTH 2 DAYS Performed at [facility],  [address], [place], [place] [address]    Report Status PENDING  Incomplete    Procedures/Studies: CT ABDOMEN PELVIS W CONTRAST Result Date: 03/24/2023 CLINICAL DATA:  Acute abdominal pain.  Recent cholecystostomy. Review of electronic records indicates history of rectal cancer. EXAM: CT ABDOMEN AND PELVIS WITH CONTRAST TECHNIQUE: Multidetector CT imaging of the abdomen and pelvis was performed using the standard protocol following bolus administration of intravenous contrast. RADIATION DOSE REDUCTION: This exam was performed according to the departmental dose-optimization program which includes automated exposure control, adjustment of the mA and/or kV according to patient size and/or use of iterative reconstruction technique. CONTRAST:  OMNIPAQUE IOHEXOL 300 MG/ML  SOLN COMPARISON:  Most recent CT 03/09/2023 FINDINGS: Lower chest: Linear atelectasis or scarring in the lung bases. No significant pleural effusion. Hepatobiliary: Cholecystostomy tube coiled in the gallbladder. Gallbladder is partially distended around the tube. Gallstones are noted. No significant fluid or fluid collection along the cholecystostomy tube. Again seen biliary stents. There is similar intrahepatic biliary ductal dilatation. Common bile duct is collapsed along the stents. Small amount of perihepatic fluid which tracks along the liver dome. There is scalloping at the right dome of the liver, for example series 8, image 69. Low-density along the falciform ligament is unchanged. Scattered tiny hepatic hypodensities. Pancreas: No ductal dilatation or inflammation. Spleen: Normal in size without focal abnormality. Adrenals/Urinary Tract: No adrenal nodule. Bilateral nonobstructing intrarenal calculi. No hydronephrosis. Again seen right renal atrophy. Renal cyst need no further imaging follow-up. Decompressed urinary bladder. Stomach/Bowel:  Again seen rectal wall thickening, patient with known neoplasm. Small to moderate volume of  stool in the colon. Equivocal wall thickening of the cecum. Occasional colonic diverticula. No small bowel obstruction. Vascular/Lymphatic: Normal caliber abdominal aorta. No discrete filling defects in the portal vein. There is some mass effect on the intrahepatic portal veins as before. No discretely enlarged lymph nodes. Reproductive: Not well assessed on the current exam. Other: Again seen peritoneal carcinomatosis with nodular omental thickening in the anterior lower abdomen. Reference omental nodule measuring 18 mm short axis series 2, image 47. Dose retrospectively measured 14 mm short axis. Some the anterior omental nodularity is less well defined due to adjacent bowel loops. Small amount of free fluid in the pelvis. No subcutaneous collection. Musculoskeletal: Bilateral L5 pars interarticularis defects no anterolisthesis of L5 on S1. No suspicious bone lesion. IMPRESSION: 1. Cholecystostomy tube coiled in the gallbladder. Gallbladder is partially distended around the tube. No significant fluid or fluid collection along the cholecystostomy tube. 2. Again seen biliary stents with similar intrahepatic biliary ductal dilatation. Common bile duct is collapsed along the stents. 3. Small amount of perihepatic fluid which tracks along the liver dome. There is scalloping at the right dome of the liver, consistent with known peritoneal carcinomatosis. 4. Peritoneal nodularity has progressed from prior exam particularly in the right lower quadrant. 5. Again seen rectal wall thickening, patient with known neoplasm. 6. Bilateral nonobstructing intrarenal calculi. 7. Small amount of free fluid in the pelvis. Electronically Signed   By: Narda Rutherford M.D.   On: 03/24/2023 19:23   IR Perc Cholecystostomy Result Date: 03/11/2023 INDICATION: Acute cholecystitis EXAM: ULTRASOUND AND FLUOROSCOPIC-GUIDED CHOLECYSTOSTOMY TUBE PLACEMENT COMPARISON:  US Abdomen, 03/09/2023.  CT AP, 03/10/2023. MEDICATIONS: The patient is  currently admitted to the hospital and on intravenous antibiotics. Antibiotics were administered within an appropriate time frame prior to skin puncture. 1 mg Dilaudid IV.  50 mg Benadryl IV.  25 mg Demerol IV ANESTHESIA/SEDATION: Moderate (conscious) sedation was employed during this procedure. A total of Versed 2 mg and Fentanyl 100 mcg was administered intravenously. Moderate Sedation Time: 16 minutes. The patient's level of consciousness and vital signs were monitored continuously by radiology nursing throughout the procedure under my direct supervision. CONTRAST:  20mL OMNIPAQUE IOHEXOL 300 MG/ML SOLN - administered into the gallbladder fossa. FLUOROSCOPY TIME:  Fluoroscopic dose; 3 mGy COMPLICATIONS: None immediate. PROCEDURE: Informed written consent was obtained from the patient after a discussion of the risks, benefits and alternatives to treatment. Questions regarding the procedure were encouraged and answered. A timeout was performed prior to the initiation of the procedure. The right upper abdominal quadrant was prepped and draped in the usual sterile fashion, and a sterile drape was applied covering the operative field. Maximum barrier sterile technique with sterile gowns and gloves were used for the procedure. A timeout was performed prior to the initiation of the procedure. Local anesthesia was provided with 1% lidocaine with epinephrine. Ultrasound scanning of the right upper quadrant demonstrates a markedly dilated gallbladder. Of note, the patient reported pain with ultrasound imaging over the gallbladder. Utilizing a transhepatic approach, a 22 gauge needle was advanced into the gallbladder under direct ultrasound guidance. An ultrasound image was saved for documentation purposes. Appropriate intraluminal puncture was confirmed with the efflux of bile and advancement of an 0.018 wire into the gallbladder lumen. The needle was exchanged for an Accustick set. A small amount of contrast was injected  to confirm appropriate intraluminal positioning. Over a Benson wire, a 10 Fr  cholecystomy tube was advanced into the gallbladder fossa, coiled and locked. Bile was aspirated and a small amount of contrast was injected as several post procedural spot radiographic images were obtained in various obliquities. The catheter was secured to the skin with suture, connected to a drainage bag and a dressing was placed. The patient tolerated the procedure well without immediate post procedural complication. IMPRESSION: Successful ultrasound and fluoroscopic guided placement of a 10 Fr cholecystostomy tube. 70 mL of serous bilious fluid was aspirated. A sample submitted for microbiological analysis. RECOMMENDATIONS: The patient will return to Vascular Interventional Radiology (VIR) for routine drainage catheter evaluation and exchange in 6-8 weeks. Roanna Banning, MD [facility] Electronically Signed   By: Roanna Banning M.D.   On: 03/11/2023 17:54   US Abdomen Limited RUQ (LIVER/GB) Result Date: 03/10/2023 CLINICAL DATA:  Right upper quadrant pain for 2 days. EXAM: ULTRASOUND ABDOMEN LIMITED RIGHT UPPER QUADRANT COMPARISON:  CT abdomen pelvis 03/09/2023 FINDINGS: Gallbladder: Dilated gallbladder with wall thickening measuring up to 7 mm. There is also sludge. Questionable stone. Common bile duct: Diameter: 2 mm.  Obscured by the known indwelling biliary stents. Liver: Homogeneous hepatic parenchyma. Tiny benign-appearing cyst in left hepatic lobe measuring 9 mm. This was seen previously. Portal vein is patent on color Doppler imaging with normal direction of blood flow towards the liver. Other: Trace ascites. IMPRESSION: No biliary dilatation but the common duct is partially obscured by the known biliary stents. Dilated gallbladder with sludge and questionable stone. There is also significant wall thickening. Please correlate for any clinical evidence of acute  cholecystitis although with the patient's history there is a differential. Further workup such as HIDA scan as clinically appropriate. Mild ascites. Please correlate with prior CT Electronically Signed   By: Karen Kays M.D.   On: 03/10/2023 19:41   CT ABDOMEN PELVIS W CONTRAST Result Date: 03/09/2023 CLINICAL DATA:  Rectal cancer with liver metastases with abdominal pain. * Tracking Code: BO * EXAM: CT ABDOMEN AND PELVIS WITH CONTRAST TECHNIQUE: Multidetector CT imaging of the abdomen and pelvis was performed using the standard protocol following bolus administration of intravenous contrast. RADIATION DOSE REDUCTION: This exam was performed according to the departmental dose-optimization program which includes automated exposure control, adjustment of the mA and/or kV according to patient size and/or use of iterative reconstruction technique. CONTRAST:  OMNIPAQUE IOHEXOL 300 MG/ML  SOLN COMPARISON:  CT 02/24/2023. FINDINGS: Lower chest: There is some linear opacity lung bases likely scar or atelectasis. No pleural effusion. Hepatobiliary: There is areas of fatty liver infiltration particularly geographically along the right hepatic lobe more so than left. This is progressive from previous examination. Portal vein is patent grossly but somewhat narrowed by extrinsic compression. There are indwelling endoscopically placed stents. Level of intrahepatic biliary duct dilatation is slightly improving on the left and some on the right. The gallbladder continues to be dilated with some wall edema which is increasing. There is more nodular tissue extending up along the porta hepatis as best seen on coronal series 8, image 49. This has a differential including progression of disease. There are some areas of progressive portal vein occlusion such is anteriorly in the right hepatic lobe inferiorly best seen on coronal imaging. Pancreas: No pancreatic mass. Persistent pancreas ductal dilatation, similar to previous.  Spleen: Normal in size without focal abnormality. Adrenals/Urinary Tract: Adrenal glands are preserved. Bilateral nonobstructing renal stones. No enhancing mass or collecting system dilatation. Preserved contour to the urinary bladder. Areas  of atrophy in the right kidney are again noted. Stomach/Bowel: On this non oral contrast exam large bowel has a normal course and caliber with scattered stool. There are loops of decompressed transverse colon. The small bowel is nondilated. Along the rectum once again is nodular wall thickening with extension into the adjacent tissue consistent with known history of neoplasm. Nodular areas are seen extending perirectal and presacral spaces. Vascular/Lymphatic: Normal caliber aorta and IVC. There is ectasia of the left common iliac artery with diameter approaching 19 mm. Scattered atherosclerotic calcified plaque. Several small less than 1 cm size in short axis retroperitoneal nodes are seen, not pathologic by size criteria. Reproductive: Uterus is present. Other: Peritoneal carcinomatosis again identified. Example measured previously anterior in the central upper abdomen with transverse dimension of 2.2 cm. Today this is measured 2.5 cm. Other areas are also similar including the pericolic gutters. There is also thickening along the surface of the liver towards the dome on the right side and laterally. Some these areas appear more prominent today than the recent prior this could be technical. Musculoskeletal: Curvature of the spine with some degenerative changes. 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 IMPRESSION: Again note is made of rectal carcinoma with peritoneal carcinomatosis. Trace ascites. The level of nodular thickening in the liver hilum appears slightly increased from previous. Indwelling biliary stents with some proving biliary ductal dilatation compared to the prior examination. However there is a more heterogeneous appearance of the liver with more  periportal edema and areas of potential developing portal vein peripheral branch thrombus particularly along the inferior aspect of the liver. Bilateral nonobstructing renal stones.  Atrophy of the right kidney. Critical Value/emergent results were called by telephone at the time of interpretation on 03/09/2023 at 11:28 am to provider [facility] , who verbally acknowledged these results. Electronically Signed   By: Karen Kays M.D.   On: 03/09/2023 11:49   DG Chest Port 1 View Result Date: 03/09/2023 CLINICAL DATA:  48 year old female with possible sepsis. Metastatic rectal cancer. EXAM: PORTABLE CHEST 1 VIEW COMPARISON:  CT Chest, Abdomen, and Pelvis 01/24/2023 and earlier. FINDINGS: Portable AP semi upright view at 0949 hours. Lung volumes and mediastinal contours remain normal. Left side PICC line remains in place. Visualized tracheal air column is within normal limits. Allowing for portable technique the lungs are clear. No pneumothorax or pleural effusion. No evidence of pneumoperitoneum. Negative visible bowel gas. No acute osseous abnormality identified. IMPRESSION: No acute cardiopulmonary abnormality. Electronically Signed   By: Odessa Fleming M.D.   On: 03/09/2023 10:23   DG ERCP Result Date: 02/27/2023 CLINICAL DATA:  Encounter for removal of biliary stent. EXAM: ERCP TECHNIQUE: Multiple spot images obtained with the fluoroscopic device and submitted for interpretation post-procedure. FLUOROSCOPY: Radiation Exposure Index (as provided by the fluoroscopic device): 106.44 mGy Kerma COMPARISON:  CT 02/24/2023 FINDINGS: Initial image demonstrates 2 plastic biliary stents. Biliary stents removed. Retrograde cholangiogram was performed. Cholangiogram images demonstrate narrowing and irregularity near the biliary confluence and involving the left and right hepatic ducts. Wires were advanced into the biliary ducts. Evidence for balloon dilatation of the biliary ducts. Final images demonstrate placement of 2  new plastic biliary stents presumably in the left and right hepatic ducts. IMPRESSION: 1. 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 These areas were dilated and new biliary stents were placed. These images were submitted for radiologic interpretation only. Please see the procedural report. Electronically Signed   By: Madelaine Bhat  Lowella Dandy M.D.   On: 02/27/2023 09:03   CT ABDOMEN PELVIS W CONTRAST Result Date: 02/24/2023 CLINICAL DATA:  Abdominal pain EXAM: CT ABDOMEN AND PELVIS WITH CONTRAST TECHNIQUE: Multidetector CT imaging of the abdomen and pelvis was performed using the standard protocol following bolus administration of intravenous contrast. RADIATION DOSE REDUCTION: This exam was performed according to the departmental dose-optimization program which includes automated exposure control, adjustment of the mA and/or kV according to patient size and/or use of iterative reconstruction technique. CONTRAST:  75mL OMNIPAQUE IOHEXOL 300 MG/ML  SOLN COMPARISON:  01/24/2023 FINDINGS: Lower chest: No acute abnormality. Hepatobiliary: Redemonstration of subtly hypoattenuating lesion within the peripheral aspect of hepatic segment 2 (series 2, image 20). Additional scattered subcentimeter low-density lesions within the liver remain too small to characterize. Prominent intrahepatic biliary dilatation with biliary stents in place. Slightly hyperattenuating material fills the gallbladder. No pericholecystic inflammatory changes by CT. Pancreas: Stable mild pancreatic ductal dilatation. No inflammatory changes. Spleen: Normal in size without focal abnormality. Adrenals/Urinary Tract: Unremarkable adrenal glands. Stable bilateral nephrolithiasis with stones measuring up to 1.1 cm on the left and 0.9 cm on the right. No hydronephrosis. Urinary bladder within normal limits for the degree of distension. Stomach/Bowel: Stomach is within normal limits. Appendix appears normal.  Similar degree of wall thickening of the rectosigmoid colon. No new sites of bowel wall thickening or inflammation. Vascular/Lymphatic: Scattered aortoiliac atherosclerotic calcifications without aneurysm. No abdominopelvic lymphadenopathy. Reproductive: Uterus and bilateral adnexa are unremarkable. Other: Similar degree of nodularity along the omentum and left pericolic gutter. No ascites or pneumoperitoneum. Musculoskeletal: No lytic or sclerotic bone lesion. Grade 2 anterolisthesis of L5 on S1 secondary to chronic bilateral L5 pars interarticularis defects. IMPRESSION: 1. No new or acute abdominopelvic findings. 2. 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 3. Prominent intrahepatic biliary dilatation with biliary stents in place. 4. Stable bilateral nephrolithiasis. 5. Aortic atherosclerosis (ICD10-I70.0). Electronically Signed   By: Duanne Guess D.O.   On: 02/24/2023 18:49     Time coordinating discharge: Over 30 minutes    Lewie Chamber, MD  [facility] 03/26/2023, 1:07 PM

## 2023-03-26 NOTE — Plan of Care (Signed)

## 2023-03-27 ENCOUNTER — Telehealth: Payer: Self-pay

## 2023-03-27 NOTE — Transitions of Care (Post Inpatient/ED Visit) (Signed)
 03/27/2023  Name: April [name] MRN: [id number] DOB: 28-Sep-1975  Today's TOC FU Call Status: Today's TOC FU Call Status:: Successful TOC FU Call Completed TOC FU Call Complete Date: 03/27/23 Patient's Name and Date of Birth confirmed.  Transition Care Management Follow-up Telephone Call Date of Discharge: 03/26/23 Discharge Facility: [facility]) Type of Discharge: Inpatient Admission Primary Inpatient Discharge Diagnosis:: Hypertensive urgency  - Suspect multifactorial mostly from uncontrolled pain and missing blood pressure meds at home prior to hospitalization How have you been since you were released from the hospital?: Better Any questions or concerns?: No  Items Reviewed: Did you receive and understand the discharge instructions provided?: Yes Medications obtained,verified, and reconciled?: Yes (Medications Reviewed) (Medication reconciliation completed based on recent discharge summary Patient taking medications as instructed and is aware of any changes or dosage adjustments medication regimen. Patient denies questions and reports no barriers to medication adherence) Any new allergies since your discharge?: No Dietary orders reviewed?: Yes Type of Diet Ordered:: Reg Heart Healthy Do you have support at home?: Yes People in Home: child(ren), adult, parent(s) Name of Support/Comfort Primary Source: Mom-Parthenia  Medications Reviewed Today: Medications Reviewed Today     Reviewed by Johnnette Barrios, RN (Registered Nurse) on 03/27/23 at 1242  Med List Status: <None>   Medication Order Taking? Sig Documenting Provider Last Dose Status Informant  amLODipine (NORVASC) 10 MG tablet [id number] Yes Take 1 tablet (10 mg total) by mouth daily. Rodolph Bong, MD Taking Active Self, Pharmacy Records           Med Note [facility], [facility]UR[facility] R   Tue Mar 24, 2023 10:20 PM) PT claim of last dose does not match dispense HX   cloNIDine (CATAPRES) 0.1 MG tablet  [id number] Yes Take 1 tablet (0.1 mg total) by mouth 3 (three) times daily. Lewie Chamber, MD Taking Active   dexamethasone (DECADRON) 4 MG tablet [id number] No Take 1 tablet by mouth twice a day for 3 days beginning day 2 of chemo.  Patient not taking: Reported on 03/17/2023   Rana Snare, NP Not Taking Active Self, Pharmacy Records           Med Note Deloria Lair, DUROJAHYE' R   Tue Mar 24, 2023 10:17 PM)    dicyclomine (BENTYL) 20 MG tablet [id number] Yes Take 1 tablet (20 mg total) by mouth 3 (three) times daily as needed for spasms (rectal/AB pain).  Patient taking differently: Take 20 mg by mouth 2 (two) times daily as needed for spasms (rectal/AB pain).   Ladene Artist, MD Taking Active Self, Pharmacy Records  HYDROmorphone (DILAUDID) 4 MG tablet [id number] Yes Take 1 tablet (4 mg total) by mouth every 4 (four) hours as needed for severe pain (pain score 7-10) or moderate pain (pain score 4-6). Lewie Chamber, MD Taking Active   lactulose [facility]) 10 GM/15ML solution [id number] Yes Take 15-30 mLs by mouth daily as needed for mild constipation. [provider] Taking Active Pharmacy Records, Self           Med Note Deloria Lair, DUROJAHYE' R   Tue Mar 24, 2023 10:17 PM)    linaclotide (LINZESS) 72 MCG capsule [id number] No Take 72 mcg by mouth daily as needed (constipation).  Patient not taking: Reported on 03/17/2023   [provider] Not Taking Active Self, Pharmacy Records  metoprolol tartrate (LOPRESSOR) 50 MG tablet [id number] Yes Take 1 tablet (50 mg total) by mouth 2 (two) times daily. Rodolph Bong, MD Taking Active Self, Pharmacy  Records  pantoprazole (PROTONIX) 40 MG tablet [id number] Yes Take 1 tablet (40 mg total) by mouth daily before breakfast. Meredeth Ide, MD Taking Active Self, Pharmacy Records  polyethylene glycol (MIRALAX / GLYCOLAX) 17 g packet [id number] Yes Take 17 g by mouth daily as needed.  Patient taking differently: Take 17 g by mouth daily as needed  for moderate constipation or mild constipation.   Meredeth Ide, MD Taking Active Self, Pharmacy Records           Med Note [facility], [facility]   Tue Mar 24, 2023 10:21 PM) Loreli Slot Last dose  potassium chloride (MICRO-K) 10 MEQ CR capsule [id number] Yes Take 2 capsules (20 mEq total) by mouth 2 (two) times daily.  Patient taking differently: Take 30 mEq by mouth 2 (two) times daily.   Rodolph Bong, MD Taking Active Self, Pharmacy Records           Med Note [facility], [place] R   Tue Mar 24, 2023 10:19 PM) PT claim of last dose does not match dispense HX  prochlorperazine (COMPAZINE) 10 MG tablet [id number] Yes Take 1 tablet (10 mg total) by mouth every 6 (six) hours as needed for nausea or vomiting. Rana Snare, NP Taking Active Self, Pharmacy Records  Sodium Chloride Flush (NORMAL SALINE FLUSH) 0.9 % SOLN [id number] Yes Use 5 mLs by Intracatheter route daily in biliary drain at 6 (six) AM as directed [place], Alvira Philips, DO Taking Active Self, Pharmacy Records          Medication reconciliation / review completed based on most recent discharge summary and EHR medication list. Confirmed patient is taking all newly prescribed medications as instructed (any discrepancies are noted in review section)   Patient / Caregiver is aware of any changes to and / or  any dosage adjustments to medication regimen. Patient/ Caregiver denies questions at this time and reports no barriers to medication adherence.   Updates  START taking: cloNIDine (CATAPRES) CHANGE how you take: HYDROmorphone (DILAUDID) STOP taking: ondansetron 8 MG tablet (ZOFRAN)   Home Care and Equipment/Supplies: Were Home Health Services Ordered?: No  Functional Questionnaire: Do you need assistance with bathing/showering or dressing?: No Do you need assistance with meal preparation?: No Do you need assistance with eating?: No Do you have difficulty maintaining continence: No Do you need assistance with getting out of  bed/getting out of a chair/moving?: No Do you have difficulty managing or taking your medications?: No  Follow up appointments reviewed: PCP Follow-up appointment confirmed?: Yes Date of PCP follow-up appointment?: 04/15/23 Follow-up Provider: Celedonio Miyamoto Follow-up appointment confirmed?: Yes Date of Specialist follow-up appointment?: 04/02/23 Follow-Up Specialty Provider:: Oncology Do you need transportation to your follow-up appointment?: No Do you understand care options if your condition(s) worsen?: Yes-patient verbalized understanding  SDOH Interventions Today    Flowsheet Row Most Recent Value  SDOH Interventions   Food Insecurity Interventions Intervention Not Indicated  Housing Interventions Intervention Not Indicated  Transportation Interventions Intervention Not Indicated, Patient Resources (Friends/Family)  Utilities Interventions Intervention Not Indicated      Interventions Today    Flowsheet Row Most Recent Value  Chronic Disease   Chronic disease during today's visit Hypertension (HTN)  General Interventions   General Interventions Discussed/Reviewed General Interventions Discussed, General Interventions Reviewed, Doctor Visits  Doctor Visits Discussed/Reviewed Doctor Visits Discussed, Doctor Visits Reviewed, PCP, Specialist  PCP/Specialist Visits Compliance with follow-up visit  Exercise Interventions   Exercise Discussed/Reviewed Physical Activity  Education Interventions  Education Provided Provided Education  Provided Verbal Education On Nutrition, Applications, Medication  Nutrition Interventions   Nutrition Discussed/Reviewed Nutrition Reviewed, Nutrition Discussed  Pharmacy Interventions   Pharmacy Dicussed/Reviewed Pharmacy Topics Discussed, Pharmacy Topics Reviewed, Medications and their functions  Safety Interventions   Safety Discussed/Reviewed Safety Discussed, Fall Risk         Benefits reviewed  Based on current  information and Insurance plan -Reviewed benefits accessible to patient, including details about eligibility options for care and  available value based care options  if any areas of needs were identified.  Reviewed patient/  caregiver's ability to access and / or  ability with navigating the benefits system..Amb Referral made if indicted , refer to orders section of note for details   Reviewed goals for care Patient  and / or Caregiveverbalizes understanding of instructions and care plan provided. Patient / Caregiver was encouraged to make informed decisions about their care, actively participate in managing their health condition, and implement lifestyle changes as needed to promote independence and self-management of health care. There were no reported  barriers to care.   TOC program  Patient is at  risk for readmission and / or has history of  high utilization  Discussed VBCI  TOC program and weekly calls to patient to assess condition/status, medication management  and provide support/education as indicated . Patient  and / or Caregive voiced understanding and declined enrollment in the 30-day TOC Program at this time .  She is doing well at home She has family support. She will schedule PCP visit She is followed by Oncology and has next visit 3/20 She has PICC line managed by Infusion center She is applying a dressing to her abdomin No s/s infection She will get additional wound care supplies at her visit 3/20    The Patient  and / or Caregiver  has been provided with contact information for the care management team and has been advised to call with any health-related questions or concerns. Patient was encouraged to Contact PCP with any questions or concerns regarding ongoing medical care, any difficulty obtaining or picking up prescriptions, any changes or worsening in condition including signs / symptoms not relieved  with interventions Patient had no additional questions or concerns at this time.    Susa Loffler , BSN, RN [facility]   VBCI-Population Health RN Care Manager Direct Dial [phone]  Fax: [phone] Website: [email or web address]

## 2023-03-29 LAB — CULTURE, BLOOD (ROUTINE X 2)
Culture: NO GROWTH
Culture: NO GROWTH

## 2023-03-30 ENCOUNTER — Telehealth: Payer: Self-pay | Admitting: *Deleted

## 2023-03-30 DIAGNOSIS — C19 Malignant neoplasm of rectosigmoid junction: Secondary | ICD-10-CM

## 2023-03-30 NOTE — Telephone Encounter (Addendum)
 April [name] called reporting she is having N/V~ 3 times day despite compazine every 6 hours with cramping abdominal pain despite hydromorphone 4 mg every 4 hours. Las BM was several days ago. Resumed MiraLax 17 grams bid 3 days ago. Has not taken any of her lactulose. She feels that she needs her treatment before scheduled 3/20 thinking that this is how she feels before she has chemo and she may end up in the hospital again if she waits till 3/20 for chemo. Upset that she did not get treatment in the hospital. After discussion with NP, she will be seen tomorrow at 10 am for lab/flush/OV and IVF. She is aware that she will not receive chemo if she is not able to eat/drink. Transportation arranged and she is aware.

## 2023-03-31 ENCOUNTER — Other Ambulatory Visit: Payer: Self-pay | Admitting: *Deleted

## 2023-03-31 ENCOUNTER — Other Ambulatory Visit (HOSPITAL_BASED_OUTPATIENT_CLINIC_OR_DEPARTMENT_OTHER): Payer: Self-pay

## 2023-03-31 ENCOUNTER — Inpatient Hospital Stay (HOSPITAL_BASED_OUTPATIENT_CLINIC_OR_DEPARTMENT_OTHER): Admitting: Nurse Practitioner

## 2023-03-31 ENCOUNTER — Ambulatory Visit: Payer: 59 | Admitting: Nurse Practitioner

## 2023-03-31 ENCOUNTER — Ambulatory Visit: Payer: 59

## 2023-03-31 ENCOUNTER — Other Ambulatory Visit

## 2023-03-31 ENCOUNTER — Other Ambulatory Visit: Payer: 59

## 2023-03-31 ENCOUNTER — Encounter: Payer: Self-pay | Admitting: Nurse Practitioner

## 2023-03-31 ENCOUNTER — Inpatient Hospital Stay

## 2023-03-31 VITALS — BP 139/72 | HR 124 | Temp 97.9°F | Resp 18 | Ht 64.0 in | Wt 125.4 lb

## 2023-03-31 DIAGNOSIS — C19 Malignant neoplasm of rectosigmoid junction: Secondary | ICD-10-CM

## 2023-03-31 DIAGNOSIS — R112 Nausea with vomiting, unspecified: Secondary | ICD-10-CM | POA: Diagnosis not present

## 2023-03-31 DIAGNOSIS — Z79634 Long term (current) use of topoisomerase inhibitor: Secondary | ICD-10-CM | POA: Diagnosis not present

## 2023-03-31 DIAGNOSIS — R109 Unspecified abdominal pain: Secondary | ICD-10-CM | POA: Diagnosis not present

## 2023-03-31 DIAGNOSIS — I10 Essential (primary) hypertension: Secondary | ICD-10-CM | POA: Diagnosis not present

## 2023-03-31 DIAGNOSIS — E860 Dehydration: Secondary | ICD-10-CM

## 2023-03-31 DIAGNOSIS — Z5111 Encounter for antineoplastic chemotherapy: Secondary | ICD-10-CM | POA: Diagnosis present

## 2023-03-31 DIAGNOSIS — C7989 Secondary malignant neoplasm of other specified sites: Secondary | ICD-10-CM | POA: Diagnosis not present

## 2023-03-31 DIAGNOSIS — C786 Secondary malignant neoplasm of retroperitoneum and peritoneum: Secondary | ICD-10-CM | POA: Diagnosis not present

## 2023-03-31 DIAGNOSIS — Z5112 Encounter for antineoplastic immunotherapy: Secondary | ICD-10-CM | POA: Diagnosis present

## 2023-03-31 DIAGNOSIS — C187 Malignant neoplasm of sigmoid colon: Secondary | ICD-10-CM | POA: Diagnosis present

## 2023-03-31 DIAGNOSIS — Z79899 Other long term (current) drug therapy: Secondary | ICD-10-CM | POA: Diagnosis not present

## 2023-03-31 DIAGNOSIS — Z87442 Personal history of urinary calculi: Secondary | ICD-10-CM | POA: Diagnosis not present

## 2023-03-31 LAB — CMP (CANCER CENTER ONLY)
ALT: 51 U/L — ABNORMAL HIGH (ref 0–44)
AST: 87 U/L — ABNORMAL HIGH (ref 15–41)
Albumin: 4.4 g/dL (ref 3.5–5.0)
Alkaline Phosphatase: 847 U/L — ABNORMAL HIGH (ref 38–126)
Anion gap: 10 (ref 5–15)
BUN: 11 mg/dL (ref 6–20)
CO2: 36 mmol/L — ABNORMAL HIGH (ref 22–32)
Calcium: 10.1 mg/dL (ref 8.9–10.3)
Chloride: 87 mmol/L — ABNORMAL LOW (ref 98–111)
Creatinine: 0.46 mg/dL (ref 0.44–1.00)
GFR, Estimated: >60 mL/min (ref >60)
Glucose, Bld: 118 mg/dL — ABNORMAL HIGH (ref 70–99)
Potassium: 3 mmol/L — ABNORMAL LOW (ref 3.5–5.1)
Sodium: 133 mmol/L — ABNORMAL LOW (ref 135–145)
Total Bilirubin: 0.8 mg/dL (ref 0.0–1.2)
Total Protein: 8.9 g/dL — ABNORMAL HIGH (ref 6.5–8.1)

## 2023-03-31 LAB — CBC WITH DIFFERENTIAL (CANCER CENTER ONLY)
Abs Immature Granulocytes: 0.02 10*3/uL (ref 0.00–0.07)
Basophils Absolute: 0 10*3/uL (ref 0.0–0.1)
Basophils Relative: 0 %
Eosinophils Absolute: 0.2 10*3/uL (ref 0.0–0.5)
Eosinophils Relative: 3 %
HCT: 36.6 % (ref 36.0–46.0)
Hemoglobin: 12.1 g/dL (ref 12.0–15.0)
Immature Granulocytes: 0 %
Lymphocytes Relative: 22 %
Lymphs Abs: 2.1 10*3/uL (ref 0.7–4.0)
MCH: 30 pg (ref 26.0–34.0)
MCHC: 33.1 g/dL (ref 30.0–36.0)
MCV: 90.6 fL (ref 80.0–100.0)
Monocytes Absolute: 0.8 10*3/uL (ref 0.1–1.0)
Monocytes Relative: 9 %
Neutro Abs: 6.3 10*3/uL (ref 1.7–7.7)
Neutrophils Relative %: 66 %
Platelet Count: 427 10*3/uL — ABNORMAL HIGH (ref 150–400)
RBC: 4.04 MIL/uL (ref 3.87–5.11)
RDW: 16.5 % — ABNORMAL HIGH (ref 11.5–15.5)
WBC Count: 9.5 10*3/uL (ref 4.0–10.5)
nRBC: 0 % (ref 0.0–0.2)

## 2023-03-31 LAB — MAGNESIUM: Magnesium: 1.9 mg/dL (ref 1.7–2.4)

## 2023-03-31 MED ORDER — ONDANSETRON HCL 4 MG/2ML IJ SOLN
4.0000 mg | Freq: Once | INTRAMUSCULAR | Status: AC
  Administered 2023-03-31: 4 mg via INTRAVENOUS
  Filled 2023-03-31: qty 2

## 2023-03-31 MED ORDER — PROCHLORPERAZINE EDISYLATE 10 MG/2ML IJ SOLN
10.0000 mg | Freq: Once | INTRAMUSCULAR | Status: AC
  Administered 2023-03-31: 10 mg via INTRAVENOUS
  Filled 2023-03-31: qty 2

## 2023-03-31 MED ORDER — SODIUM CHLORIDE 0.9 % IV SOLN: INTRAVENOUS | Status: AC

## 2023-03-31 MED ORDER — SODIUM CHLORIDE 0.9 % IV SOLN: INTRAVENOUS | Status: DC

## 2023-03-31 MED ORDER — HYDROMORPHONE HCL 4 MG PO TABS
4.0000 mg | ORAL_TABLET | ORAL | 0 refills | Status: DC | PRN
  Filled 2023-03-31: qty 50, 9d supply, fill #0

## 2023-03-31 MED ORDER — MORPHINE SULFATE (PF) 2 MG/ML IV SOLN
2.0000 mg | Freq: Once | INTRAVENOUS | Status: AC
  Administered 2023-03-31: 2 mg via INTRAVENOUS
  Filled 2023-03-31: qty 1

## 2023-03-31 NOTE — Progress Notes (Signed)
 [facility] OFFICE PROGRESS NOTE   Diagnosis: Colon cancer  INTERVAL HISTORY:   April [name] returns prior to scheduled follow-up due to recurrent abdominal pain and nausea/vomiting.  She was hospitalized with similar symptoms 03/24/2023 through 03/26/2023.  Blood pressure was markedly elevated due to inability to take medications due to above symptoms.  Her pain responded well to IV Dilaudid.  She was transitioned back to oral Dilaudid at discharge.  Since hospital discharge she reports recurrent nausea/vomiting and poorly controlled abdominal pain.  She thinks the nausea may at least in part be related to taking Dilaudid without food.  She took lactulose and MiraLAX last night and had a bowel movement.  Prior to that last bowel movement was 4 to 5 days ago.  She does not want to return to the hospital.  Objective:  Vital signs in last 24 hours:  Blood pressure 139/72, pulse (!) 124, temperature 97.9 F (36.6 C), temperature source Temporal, resp. rate 18, height 5\' 4"  (1.626 m), weight 125 lb 6.4 oz (56.9 kg), last menstrual period 01/07/2006, SpO2 98%.    HEENT: White coating over tongue.  No buccal thrush.  Tongue appears dry. Resp: Lungs clear bilaterally. Cardio: Regular, tachycardic. GI: Abdomen is soft.  Tender over the mid abdomen.  Cholecystostomy tube site is without erythema. Vascular: No leg edema. Neuro: Alert and oriented. Skin: Skin turgor intact. Right upper extremity PICC without erythema.  Lab Results:  Lab Results  Component Value Date   WBC 9.5 03/31/2023   HGB 12.1 03/31/2023   HCT 36.6 03/31/2023   MCV 90.6 03/31/2023   PLT 427 (H) 03/31/2023   NEUTROABS 6.3 03/31/2023    Imaging:  No results found.  Medications: I have reviewed the patient's current medications.  Assessment/Plan: Sigmoid colon cancer, stage IV (pT4a,pN2b,M1c) Colonoscopy 03/24/2019-3 rectal polyps-hyperplastic polyps, distal colon biopsy-at least intramucosal  adenocarcinoma, completely obstructing mass in the distal sigmoid colon, could not be traversed, intact mismatch repair protein expression 03/24/2019-CEA 56.1 03/29/2019 CT abdomen/pelvis-circumferential thickening involving the entire mid and distal sigmoid colon to the level of the rectosigmoid junction, solid/cystic mass in the left ovary, bilateral nephrolithiasis Robotic assisted low anterior resection, mesenteric lymphadenectomy, bilateral salpingo-oophorectomy 04/08/2019 Pathology ([facility] review of outside pathology) metastatic adenocarcinoma involving the peritoneum overlying the round ligament, serosa of the urinary bladder, serosa of the right ureter a sacral area, pelvic peritoneum, and left ovary.  Omental biopsy with focal mucin pools with no carcinoma cells identified, right hemidiaphragm biopsy involved by metastatic adenocarcinoma, invasive adenocarcinoma the sigmoid colon, moderately differentiated, T4a, perineural and vascular invasion present, 7/22 lymph nodes, multiple tumor deposits, resection margins negative Negative for PD-L1, low probability of MSI-high, HER-2 negative, negative for BRAF, NRAS and KRAS alterations CTs 05/19/2019-no evidence of metastatic disease, findings suspicious for colitis of the transverse and ascending colon, small amount of ascites in the cul-de-sac, bilateral renal calculi Cycle 1 FOLFIRI 05/24/2019, bevacizumab added with cycle 2 Cycle 4 FOLFIRI/bevacizumab 07/05/2019 Cycle 5 FOLFIRI 07/27/2019, bevacizumab held secondary to hypertension Cycle 6 FOLFIRI 08/10/1999, bevacizumab held secondary to hypertension CTs 08/30/2019-no evidence of recurrent disease, new subsolid right lower lobe nodule felt to be inflammatory, emphysema Cycle 7 FOLFIRI 09/26/2019, bevacizumab remains on hold secondary to hypertension Cycle 8 FOLFIRI 10/17/2019, bevacizumab held secondary to hypertension, Udenyca added for neutropenia Cycle 9 FOLFIRI 11/07/2019, bevacizumab held secondary to  hypertension, Udenyca Cycle 10 FOLFIRI 11/28/2019, bevacizumab held, Udenyca Cycle 11 FOLFIRI 12/26/2019, bevacizumab held, Udenyca CTs 01/25/2020-no evidence of recurrent disease, resolution of right  lower lobe nodule Maintenance Xeloda beginning 02/06/2020 CTs 04/25/2020- no evidence of metastatic disease, multiple bilateral renal calculi without hydronephrosis Maintenance Xeloda continued CT abdomen/pelvis without contrast 08/10/2020-bilateral staghorn renal calculi, no evidence of metastatic disease; addendum 08/23/2020-small but increasing omental nodules. Cycle 1 FOLFIRI/panitumumab 09/19/2020 Cycle 2 FOLFIRI/Panitumumab 10/03/2020 Cycle 3 FOLFIRI/Panitumumab 10/17/2020 Cycle 4 FOLFIRI/panitumumab 10/31/2020 Cycle 5 FOLFIRI/Panitumumab 11/14/2020 CTs 11/26/2020-stable omental metastases compared to 10/22/2020, mildly decreased from 08/10/2020.  Left lower quadrant tiny paracolic gutter implant slightly decreased from CT 08/10/2020.  No new or progressive metastatic disease in the abdomen or pelvis. Cycle 6 FOLFIRI/Panitumumab 11/28/2020, irinotecan dose reduced, treatment schedule adjusted to every 3 weeks going forward Cycle 7 FOLFIRI/panitumumab 12/24/2020 Cycle 8 FOLFIRI/Panitumumab 01/16/2021--treatment held due to hypertension in the infusion area. Cycle 8 FOLFIRI/panitumumab 01/21/2021 Cycle 9 FOLFIRI/panitumumab 02/11/2021 Cycle 10 FOLFIRI/Panitumumab 03/04/2021 Cycle 11 FOLFIRI/Panitumumab 03/25/2021 CT abdomen/pelvis 04/10/2021-stable right omental and left posterior paracolic gutter nodules Cycle 12 FOLFIRI/panitumumab 04/15/2021 Cycle 13 FOLFIRI/panitumumab 05/06/2021 Cycle 14 FOLFIRI/Panitumumab 05/27/2021 Cycle 15 FOLFIRI/Panitumumab 06/17/2021 CT abdomen/pelvis 07/05/2021-slight enlargement of a dominant right omental implant, no new implants Patient requested a treatment break CT abdomen/pelvis 10/04/2021-mild increase in size of multiple omental soft tissue nodules, 4 mm calculus in the distal  left ureter adjacent to the ureteral stent Cycle 1 Lonsurf 10/14/2021 10/28/2021 Avastin every 2 weeks Cycle 2 Lonsurf 11/11/2021 Cycle 3 Lonsurf 12/09/2021 Cycle 4 Lonsurf 01/06/2022 Avastin held 01/20/2022 due to proteinuria, 24-hour urine 43 mg CT/pelvis 01/30/2022-possible new peritoneal implant at the transverse colon, no significant change in other omental/peritoneal implants, stable chronic rectal wall thickening, status post removal of double-J left ureteral stent with no evidence of hydronephrosis or ureteral calculus, nonobstructing bilateral renal calculi Cycle 5 Lonsurf 02/05/2022 or 02/06/2022 Avastin every 2 weeks Cycle 6 Lonsurf 03/03/2022 CTs 04/05/2022-increase in size and number of peritoneal implants compared to January 2024 central liver mass narrowing the anterior branch of the right portal vein, decreased biliary duct dilation, biliary stents in place, stable right cardiophrenic lymph nodes, separate pigtail stent in the lumen of the proximal duodenum Cycle 1 FOLFOX 04/14/2022 CT abdomen/pelvis 04/16/2022-stent within the third portion of the duodenum, stable peritoneal nodules, indwelling biliary stents with mild left biliary duct dilation Cycle 2 FOLFOX 04/28/2022, Emend and prophylactic dexamethasone added Cycle 3 FOLFOX 05/12/2022, Aloxi, Emend, prophylactic dexamethasone, Compazine and lorazepam as needed Chemotherapy held 05/25/2020 due to severe hypertension, evaluated in the emergency department Cycle 4 FOLFOX 06/03/2022 Cycle 5 FOLFOX 06/16/2022, oxaliplatin dose reduced and 5-FU bolus eliminated due to neutropenia CT 06/25/2022-unchanged soft tissue at the hepatic hilum, unchanged, bile duct stent, decreased size of peritoneal and omental nodules and diminished volume of likely fluid in the lower left pelvis Cycle 6 FOLFOX 06/30/2022 Cycle 7 FOLFOX 07/21/2022 CT 07/24/2022 (in the emergency department with nausea, headache, abdominal pain)-no acute pathology.  No significant interval  change in omental nodularity. Cycle 8 FOLFOX 08/04/2022, pruritus on the palms at the end of the oxaliplatin infusion, Pepcid and Benadryl given, symptoms resolved, infusion not resumed Cycle 9 FOLFOX 08/18/2022-Pepcid and Benadryl added to premedications, Oxaliplatin diluted in a larger volume and infusion time increased Cycle 10 FOLFOX 09/08/2022 Cycle 11 FOLFOX 09/22/2022 10/06/2022 patient declined treatment CTs 10/15/2022-similar soft tissue fullness in the hepatic hilum with biliary stents in place, decrease in omental/peritoneal metastases Maintenance capecitabine 10/26/2022 CT abdomen/pelvis 11/19/2022-mild increase in tumor studding at the left paracolic gutter and omentum, circumferential wall thickening of the rectum, biliary stents in place with minimal increase in intrahepatic biliary dilation, nonobstructive renal calculi Colonoscopy  11/23/2022-anastomosis at 15 cm from the anal verge-patent, 2 cm polypoid tissue at the anastomosis biopsied, invasive adenocarcinoma moderately differentiated, preserved expression of major MMR proteins CTs 12/15/2022: Possible developing nodule in the lateral left liver, increased omental nodularity, persistent wall thickening in the upper rectum CT abdomen/pelvis 12/24/2022: Increased thickened fold in the transverse colon-infectious/inflammatory, 2.1 cm lateral left liver lesion, potential new 5 mm segment 5 lesion, stable biliary stents with intrahepatic biliary dilation, central biliary wall thickening near the hepatic hilum, multiple peritoneal/omental implants unchanged from 12/15/2022, but likely larger than 11/19/2022 Cycle 1 irinotecan/Panitumumab 01/06/2023 Cycle 2 irinotecan/Panitumumab 01/20/2023 CTs 01/24/2023-slight increase in omental nodularity. Cycle 3 irinotecan/Panitumumab 02/03/2023 Cycle 4 irinotecan/panitumumab 02/17/2023 CTs abdomen/pelvis 02/24/2023-subtly hypoattenuating lesion within the peripheral aspect of hepatic segment 2 redemonstrated.   Additional scattered subcentimeter low-density lesions within the liver too small to characterize.  Prominent intrahepatic biliary dilatation with biliary stents in place.  Similar degree of nodularity along the omentum and left paracolic gutter.  No ascites. CTs 03/09/2023-indwelling endoscopically placed stents; level of intrahepatic biliary duct dilatation is slightly improved on the left and some on the right.  Gallbladder is dilated with wall edema which is increasing.  More nodular tissue extending up along the porta hepatis.  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  Rectum with nodular wall thickening with extension into the adjacent tissue, nodular areas seen extending perirectal and presacral spaces.  Peritoneal carcinomatosis again identified.   Hypertension G4 P3, twins Kidney stones-bilateral staghorn renal calculi on CT 08/10/2020 Infected Port-A-Cath 09/12/2019-placed on Augmentin, referred for Port-A-Cath removal; Port-A-Cath removed 09/14/2019; PICC line placed 09/14/2019; culture staph aureus.  Course of Septra completed. Neutropenia secondary to chemotherapy-Udenyca added with cycle 8 FOLFIRI Right nephrostomy tube 09/12/2020; stent placed 10/09/2020, percutaneous nephrostolithotomy treatment of right-sided kidney stones 10/09/2020, malpositioned right ureter stent replacement 10/23/2020, right ureter stent removed 10/29/2020 Percutaneous left nephrostolithotomy 10/01/2021-no renal stone identified, left ureter stent left in place Cystoscopy 10/14/2021-stent removed 8.  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  2 moderate biliary strictures found in the hepatic duct system, malignant appearing.  Left and right hepatic duct and all intrahepatic branches were moderately dilated secondary to the strictures.  The strictures were dilated.  Plastic stent placed into the left hepatic duct and right hepatic duct. 9.  Admission 04/16/2022 with nausea/vomiting and abdominal pain, felt to be acute toxicity related to chemotherapy, discharged home 04/18/2022 10.  Admission 11/19/2022 with nausea/vomiting and abdominal pain-improved 11.  Admission 12/15/2022 with fever and right submandibular swelling/tenderness CT neck 12/15/2022: 2 mm distal right submandibular duct calculus with mild inflammatory change adjacent to the right submandibular gland 12.  Admission 12/24/2022 with a fever nausea/vomiting, diarrhea, Klebsiella bacteremia completed course of Augmentin 13.  Admission with abdominal pain and nausea/vomiting 03/09/2023 through 03/14/2023-concern for gallbladder inflammation.  Not felt to be a surgical candidate.  Percutaneous cholecystostomy tube placed 03/11/2023.    Disposition: April [name] appears stable.  She was recently hospitalized with nausea/vomiting and poor pain control.  She presents today with similar  symptoms.  She will receive a liter of IV fluids, IV morphine and IV antiemetics.  She does not want to return to the hospital.  We will make arrangements for her to have IV fluids in the office tomorrow as well.  She is scheduled to return for routine follow-up and irinotecan/Panitumumab on 04/02/2023.    Lonna Cobb ANP/GNP-BC   03/31/2023  11:59 AM  Addendum 1:40 PM-pain is better.  Persistent nausea.  Plan for Zofran 4 mg IV.

## 2023-03-31 NOTE — Patient Instructions (Signed)
 Prochlorperazine Injection What is this medication? PROCHLORPERAZINE (proe klor PER a zeen) treats nausea and vomiting. It works by blocking substances in your body that may cause nausea and vomiting. It may also be used to treat schizophrenia. It works by balancing the levels of dopamine in your brain, a substance that helps regulate mood, behaviors, and thoughts. This medicine may be used for other purposes; ask your health care provider or pharmacist if you have questions. COMMON BRAND NAME(S): Compazine, Compazine Solution What should I tell my care team before I take this medication? They need to know if you have any of these conditions: Blockage in your bowel Brain tumor Dementia Diabetes Difficulty swallowing Frequently drink alcohol Glaucoma Have trouble controlling your muscles Head injury Heart disease History of irregular heartbeat Liver disease Low blood counts, such as low white cell, platelet, or red cell counts Low blood pressure Lung or breathing disease, such as asthma Parkinson disease Prostate disease Seizures Trouble passing urine An unusual or allergic reaction to prochlorperazine, other medications, foods, dyes, or preservatives Pregnant or trying to get pregnant Breast-feeding How should I use this medication? This medication is for injection into a muscle or into a vein. It is given in a hospital or clinic setting. Talk to your care team about the use of this medication in children. While this medication may be prescribed for children as young as 38 years of age for selected conditions, precautions do apply. Overdosage: If you think you have taken too much of this medicine contact a poison control center or emergency room at once. NOTE: This medicine is only for you. Do not share this medicine with others. What if I miss a dose? This does not apply. What may interact with this medication? Do not take this medication with any of the  following: Cisapride Dofetilide Dronedarone Metoclopramide Pimozide Saquinavir Thioridazine This medication may also interact with the following: Alcohol Antihistamines for allergy, cough, and cold Atropine Certain medications for anxiety or sleep Certain medications for bladder problems, such as oxybutynin, tolterodine Certain medications for depression, such as amitriptyline, fluoxetine, sertraline Certain medications for stomach problems, such as dicyclomine, hyoscyamine Certain medications for travel sickness, such as scopolamine Epinephrine General anesthetics, such as halothane, isoflurane, methoxyflurane, propofol Ipratropium Levodopa or other medications for Parkinson disease Lithium Medications for blood pressure Medications for seizures, such as phenobarbital, primidone, phenytoin Medications that relax muscles for surgery Opioid medications for pain Propranolol Warfarin This list may not describe all possible interactions. Give your health care provider a list of all the medicines, herbs, non-prescription drugs, or dietary supplements you use. Also tell them if you smoke, drink alcohol, or use illegal drugs. Some items may interact with your medicine. What should I watch for while using this medication? Your condition will be monitored carefully while you are receiving this medication. This medication may affect your coordination, reaction time, or judgment. Do not drive or operate machinery until you know how this medication affects you. Sit up or stand slowly to reduce the risk of dizzy or fainting spells. Drinking alcohol with this medication can increase the risk of these side effects. This medication can cause problems with controlling your body temperature. It can lower the response of your body to cold temperatures. If possible, stay indoors during cold weather. If you must go outdoors, wear warm clothes. It can also lower the response of your body to heat. Do not  overheat. Do not over-exercise. Stay out of the sun when possible. If you must be in  the sun, wear cool clothing. Drink plenty of water. If you have trouble controlling your body temperature, call your care team right away. This medication may increase blood sugar. The risk may be higher in patients who already have diabetes. Ask your care team what you can do to lower your risk of diabetes while taking this medication. This medication can make you more sensitive to the sun. Keep out of the sun. If you cannot avoid being in the sun, wear protective clothing and use sunscreen. Do not use sun lamps or tanning beds/booths. Your mouth may get dry. Chewing sugarless gum or sucking hard candy, and drinking plenty of water may help. Contact your care team if the problem does not go away or is severe. What side effects may I notice from receiving this medication? Side effects that you should report to your care team as soon as possible: Allergic reactions--skin rash, itching, hives, swelling of the face, lips, tongue, or throat High fever, stiff muscles, increased sweating, fast or irregular heartbeat, and confusion, which may be signs of neuroleptic malignant syndrome High prolactin level--unexpected breast tissue growth, discharge from the nipple, change in sex drive or performance, irregular menstrual cycle Infection--fever, chills, cough, or sore throat Low blood pressure--dizziness, feeling faint or lightheaded, blurry vision Uncontrolled and repetitive body movements, muscle stiffness or spasms, tremors or shaking, loss of balance or coordination, restlessness, shuffling walk, which may be signs of extrapyramidal symptoms (EPS) Side effects that usually do not require medical attention (report to your care team if they continue or are bothersome): Constipation Dizziness Drowsiness Dry mouth Pain, redness, or irritation at injection site This list may not describe all possible side effects. Call your  doctor for medical advice about side effects. You may report side effects to FDA at 1-800-FDA-1088. Where should I keep my medication? This medication is given in a hospital or clinic. It will not be stored at home. NOTE: This sheet is a summary. It may not cover all possible information. If you have questions about this medicine, talk to your doctor, pharmacist, or health care provider.  2024 [employer]/Gold Standard (2020-12-14 00:00:00)Ondansetron Injection What is this medication? ONDANSETRON (on DAN se tron) prevents nausea and vomiting from chemotherapy, radiation, or surgery. It works by blocking substances in the body that may cause nausea or vomiting. It belongs to a class of medications called antiemetics. This medicine may be used for other purposes; ask your health care provider or pharmacist if you have questions. COMMON BRAND NAME(S): Zofran, Zofran in Dextrose, Zofran Solution What should I tell my care team before I take this medication? They need to know if you have any of these conditions: Heart disease History of irregular heartbeat Liver disease Low levels of magnesium or potassium in the blood An unusual or allergic reaction to ondansetron, granisetron, other medications, foods, dyes, or preservatives Pregnant or trying to get pregnant Breast-feeding How should I use this medication? This medication is injected into a vein. It is given by your care team in a hospital or clinic setting. Talk to your care team about the use of this medication in children. Special care may be needed. Overdosage: If you think you have taken too much of this medicine contact a poison control center or emergency room at once. NOTE: This medicine is only for you. Do not share this medicine with others. What if I miss a dose? This does not apply. What may interact with this medication? Do not take this medication with any of the  following: Apomorphine Certain medications for fungal infections,  such as fluconazole, itraconazole, ketoconazole, posaconazole, voriconazole Cisapride Dronedarone Pimozide Thioridazine This medication may also interact with the following: Carbamazepine Certain medications for depression, anxiety, or mental health conditions Fentanyl Linezolid MAOIs, such as Carbex, Eldepryl, Marplan, Nardil, and Parnate Methylene blue (injected into a vein) Other medications that cause heart rhythm changes, such as dofetilide, ziprasidone Phenytoin Rifampicin Tramadol This list may not describe all possible interactions. Give your health care provider a list of all the medicines, herbs, non-prescription drugs, or dietary supplements you use. Also tell them if you smoke, drink alcohol, or use illegal drugs. Some items may interact with your medicine. What should I watch for while using this medication? Your condition will be monitored carefully while you are receiving this medication. What side effects may I notice from receiving this medication? Side effects that you should report to your care team as soon as possible: Allergic reactions--skin rash, itching, hives, swelling of the face, lips, tongue, or throat Bowel blockage--stomach cramping, unable to have a bowel movement or pass gas, loss of appetite, vomiting Chest pain (angina)--pain, pressure, or tightness in the chest, neck, back, or arms Heart rhythm changes--fast or irregular heartbeat, dizziness, feeling faint or lightheaded, chest pain, trouble breathing Irritability, confusion, fast or irregular heartbeat, muscle stiffness, twitching muscles, sweating, high fever, seizure, chills, vomiting, diarrhea, which may be signs of serotonin syndrome Side effects that usually do not require medical attention (report to your care team if they continue or are bothersome): Constipation Diarrhea General discomfort and fatigue Headache This list may not describe all possible side effects. Call your doctor for medical  advice about side effects. You may report side effects to FDA at 1-800-FDA-1088. Where should I keep my medication? This medication is given in a hospital or clinic and will not be stored at home. NOTE: This sheet is a summary. It may not cover all possible information. If you have questions about this medicine, talk to your doctor, pharmacist, or health care provider.  2024 [employer]/Gold Standard (2021-05-30 00:00:00)Morphine Injection What is this medication? MORPHINE (MOR feen) treats severe pain. It is prescribed when other pain medications have not worked or cannot be tolerated. It works by blocking pain signals in the brain. It belongs to a group of medications called opioids. This medicine may be used for other purposes; ask your health care provider or pharmacist if you have questions. COMMON BRAND NAME(S): Astramorph PF, Duramorph, Duramorph PF, Infumorph, MITIGO What should I tell my care team before I take this medication? They need to know if you have any of these conditions: Bleeding disorder Brain tumor Frequently drink alcohol Head injury Heart disease Low adrenal gland function Lung or breathing disease, such as asthma Seizures Stomach or intestine problems History of substance use disorder Take medications that treat or prevent blood clots Taken an MAOI, such as Marplan, Nardil, or Parnate in the last 14 days Trouble passing urine An unusual or allergic reaction to morphine, other medications, foods, dyes, or preservatives Pregnant or trying to get pregnant Breastfeeding How should I use this medication? This medication is injected into a muscle, vein, or under the skin. It is usually given by your care team in a hospital or clinic setting. It may also be given at home. If you get this medication at home, you will be taught how to prepare and give it. Use exactly as directed. Take it as directed on the prescription label. Do not take it more often than directed.  There  may be unused or extra doses after you finish your treatment. Talk to your care team if you have questions about your dose. Always look at your medication before using it. Do not use the injection if its color is darker than pale yellow or if it is discolored in any other way. Do not use this medication if it is cloudy, thickened, colored, or has solid particles in it. It is important that you put your used needles and syringes in a special sharps container. Do not put them in a trash can. If you do not have a sharps container, call your pharmacist or care team to get one. Talk to your care team about the use of this medication in children. Special care may be needed. Overdosage: If you think you have taken too much of this medicine contact a poison control center or emergency room at once. NOTE: This medicine is only for you. Do not share this medicine with others. What if I miss a dose? If you miss a dose, take it as soon as you can. If it is almost time for your next dose, take only that dose. Do not take double or extra doses. What may interact with this medication? Do not take this medication with any of the following: Linezolid MAOIs, such as Marplan, Nardil, and Parnate Methylene blue Samidorphan This medication may interact with the following: Alcohol Antihistamines for allergy, cough, and cold Atropine Certain medications for anxiety or sleep Certain medications for bladder problems, such as oxybutynin, tolterodine Certain medications for depression, such as amitriptyline, fluoxetine, sertraline, mirtazapine, trazodone Certain medications for migraine headache, such as almotriptan, eletriptan, frovatriptan, naratriptan, rizatriptan, sumatriptan, zolmitriptan Certain medications for nausea or vomiting, such as dolasetron, granisetron, ondansetron, palonosetron Certain medications for Parkinson disease, such as benztropine, trihexyphenidyl Certain medications for seizures, such as  phenobarbital, primidone Certain medications for stomach problems, such as dicyclomine, hyoscyamine Certain medications for travel sickness, such as scopolamine Clopidogrel Diuretics General anesthetics, such as halothane, isoflurane, methoxyflurane, propofol Ipratropium Medications that relax muscles Other opioid medications for pain or cough Phenothiazines, such as chlorpromazine, mesoridazine, prochlorperazine, thioridazine Prasugrel Ticagrelor This list may not describe all possible interactions. Give your health care provider a list of all the medicines, herbs, non-prescription drugs, or dietary supplements you use. Also tell them if you smoke, drink alcohol, or use illegal drugs. Some items may interact with your medicine. What should I watch for while using this medication? Tell your care team if your pain does not go away, if it gets worse, or if you have new or a different type of pain. You may develop tolerance to this medication. Tolerance means that you will need a higher dose of the medication for pain relief. Tolerance is normal and is expected if you take this medication for a long time. Taking this medication with other substances that cause drowsiness, such as alcohol, benzodiazepines, or other opioids can cause serious side effects. Give your care team a list of all medications you use. They will tell you how much medication to take. Do not take more medication than directed. Call emergency services if you have problems breathing or staying awake. Children may be at higher risk for side effects. Stop giving this medication and call emergency services right away if your child has slow or noisy breathing, has confusion, is unusually sleepy, or not able to wake up. Long term use of this medication may cause your brain and body to depend on it. This can happen even  when used as directed by your care team. You and your care team will work together to determine how long you will need to  take this medication. If your care team wants you to stop this medication, the dose will be slowly lowered over time to reduce the risk of side effects. Naloxone is an emergency medication used for an opioid overdose. An overdose can happen if you take too much of an opioid. It can also happen if an opioid is taken with some other medications or substances such as alcohol. Know the symptoms of an overdose, such as trouble breathing, unusually tired or sleepy, or not being able to respond or wake up. Make sure to tell caregivers and close contacts where your naloxone is stored. Make sure they know how to use it. After naloxone is given, the person giving it must call emergency services. Naloxone is a temporary treatment. Repeat doses may be needed. This medication may affect your coordination, reaction time, or judgment. Do not drive or operate machinery until you know how this medication affects you. Sit up or stand slowly to reduce the risk of dizzy or fainting spells. Drinking alcohol with this medication can increase the risk of these side effects. This medication will cause constipation. If you do not have a bowel movement for 3 days, call your care team. Your mouth may get dry. Chewing sugarless gum or sucking hard candy and drinking plenty of water may help. Contact your care team if the problem does not go away or is severe. Talk to your care team if you may be pregnant. Prolonged use of this medication during pregnancy can cause temporary withdrawal in a newborn. Talk to your care team before breastfeeding. Changes to your treatment plan may be needed. If you breastfeed while taking this medication, seek medical care right away if you notice the child has slow or noisy breathing, is unusually sleepy or not able to wake up, or is limp. Long-term use of this medication may cause infertility. Talk to your care team if you are concerned about your fertility. What side effects may I notice from receiving  this medication? Side effects that you should report to your care team as soon as possible: Allergic reactions--skin rash, itching, hives, swelling of the face, lips, tongue, or throat CNS depression--slow or shallow breathing, shortness of breath, feeling faint, dizziness, confusion, difficulty staying awake Low adrenal gland function--nausea, vomiting, loss of appetite, unusual weakness or fatigue, dizziness Low blood pressure--dizziness, feeling faint or lightheaded, blurry vision Side effects that usually do not require medical attention (report to your care team if they continue or are bothersome): Constipation Dizziness Drowsiness Dry mouth Headache Nausea Vomiting This list may not describe all possible side effects. Call your doctor for medical advice about side effects. You may report side effects to FDA at 1-800-FDA-1088. Where should I keep my medication? Keep out of the reach of children and pets. This medication can be abused. Keep it in a safe place to protect it from theft. Do not share this medication with anyone. Selling or giving away this medication is dangerous and is against the law. If you are using this medication at home, you will be instructed on how to store this medication. Throw away any unused medication after the expiration date on the label. Discard unused medication and used packaging carefully. Children and pets can be harmed if they find used or lost packages. NOTE: This sheet is a summary. It may not cover all possible information.  If you have questions about this medicine, talk to your doctor, pharmacist, or health care provider.  2024 [employer]/Gold Standard (2022-01-30 00:00:00)

## 2023-04-01 ENCOUNTER — Inpatient Hospital Stay

## 2023-04-01 ENCOUNTER — Other Ambulatory Visit: Payer: Self-pay | Admitting: Nurse Practitioner

## 2023-04-01 ENCOUNTER — Other Ambulatory Visit

## 2023-04-01 DIAGNOSIS — C19 Malignant neoplasm of rectosigmoid junction: Secondary | ICD-10-CM

## 2023-04-02 ENCOUNTER — Inpatient Hospital Stay

## 2023-04-02 ENCOUNTER — Telehealth: Payer: Self-pay | Admitting: Oncology

## 2023-04-02 ENCOUNTER — Other Ambulatory Visit: Payer: Self-pay

## 2023-04-02 ENCOUNTER — Encounter: Payer: Self-pay | Admitting: Nurse Practitioner

## 2023-04-02 ENCOUNTER — Inpatient Hospital Stay (HOSPITAL_BASED_OUTPATIENT_CLINIC_OR_DEPARTMENT_OTHER): Admitting: Nurse Practitioner

## 2023-04-02 ENCOUNTER — Other Ambulatory Visit (HOSPITAL_BASED_OUTPATIENT_CLINIC_OR_DEPARTMENT_OTHER): Payer: Self-pay

## 2023-04-02 VITALS — BP 180/95 | HR 76 | Temp 97.6°F | Resp 18

## 2023-04-02 VITALS — BP 142/78 | HR 96 | Temp 98.1°F | Resp 18 | Ht 64.0 in | Wt 130.0 lb

## 2023-04-02 DIAGNOSIS — C19 Malignant neoplasm of rectosigmoid junction: Secondary | ICD-10-CM

## 2023-04-02 DIAGNOSIS — Z95828 Presence of other vascular implants and grafts: Secondary | ICD-10-CM

## 2023-04-02 DIAGNOSIS — C20 Malignant neoplasm of rectum: Secondary | ICD-10-CM | POA: Diagnosis not present

## 2023-04-02 DIAGNOSIS — E876 Hypokalemia: Secondary | ICD-10-CM

## 2023-04-02 DIAGNOSIS — E860 Dehydration: Secondary | ICD-10-CM

## 2023-04-02 DIAGNOSIS — Z5112 Encounter for antineoplastic immunotherapy: Secondary | ICD-10-CM | POA: Diagnosis not present

## 2023-04-02 LAB — CBC WITH DIFFERENTIAL (CANCER CENTER ONLY)
Abs Immature Granulocytes: 0.02 10*3/uL (ref 0.00–0.07)
Basophils Absolute: 0.1 10*3/uL (ref 0.0–0.1)
Basophils Relative: 1 %
Eosinophils Absolute: 0.3 10*3/uL (ref 0.0–0.5)
Eosinophils Relative: 4 %
HCT: 33.4 % — ABNORMAL LOW (ref 36.0–46.0)
Hemoglobin: 10.9 g/dL — ABNORMAL LOW (ref 12.0–15.0)
Immature Granulocytes: 0 %
Lymphocytes Relative: 34 %
Lymphs Abs: 2.4 10*3/uL (ref 0.7–4.0)
MCH: 29.7 pg (ref 26.0–34.0)
MCHC: 32.6 g/dL (ref 30.0–36.0)
MCV: 91 fL (ref 80.0–100.0)
Monocytes Absolute: 0.6 10*3/uL (ref 0.1–1.0)
Monocytes Relative: 8 %
Neutro Abs: 3.9 10*3/uL (ref 1.7–7.7)
Neutrophils Relative %: 53 %
Platelet Count: 364 10*3/uL (ref 150–400)
RBC: 3.67 MIL/uL — ABNORMAL LOW (ref 3.87–5.11)
RDW: 15.9 % — ABNORMAL HIGH (ref 11.5–15.5)
WBC Count: 7.3 10*3/uL (ref 4.0–10.5)
nRBC: 0 % (ref 0.0–0.2)

## 2023-04-02 LAB — CMP (CANCER CENTER ONLY)
ALT: 63 U/L — ABNORMAL HIGH (ref 0–44)
AST: 110 U/L — ABNORMAL HIGH (ref 15–41)
Albumin: 3.9 g/dL (ref 3.5–5.0)
Alkaline Phosphatase: 878 U/L — ABNORMAL HIGH (ref 38–126)
Anion gap: 10 (ref 5–15)
BUN: 5 mg/dL — ABNORMAL LOW (ref 6–20)
CO2: 35 mmol/L — ABNORMAL HIGH (ref 22–32)
Calcium: 9.3 mg/dL (ref 8.9–10.3)
Chloride: 90 mmol/L — ABNORMAL LOW (ref 98–111)
Creatinine: 0.42 mg/dL — ABNORMAL LOW (ref 0.44–1.00)
GFR, Estimated: >60 mL/min (ref >60)
Glucose, Bld: 120 mg/dL — ABNORMAL HIGH (ref 70–99)
Potassium: 2.7 mmol/L — CL (ref 3.5–5.1)
Sodium: 135 mmol/L (ref 135–145)
Total Bilirubin: 0.5 mg/dL (ref 0.0–1.2)
Total Protein: 7.7 g/dL (ref 6.5–8.1)

## 2023-04-02 LAB — CEA (ACCESS): CEA (CHCC): 22.16 ng/mL — ABNORMAL HIGH (ref 0.00–5.00)

## 2023-04-02 LAB — MAGNESIUM: Magnesium: 1.7 mg/dL (ref 1.7–2.4)

## 2023-04-02 MED ORDER — DEXAMETHASONE SODIUM PHOSPHATE 10 MG/ML IJ SOLN
10.0000 mg | Freq: Once | INTRAMUSCULAR | Status: AC
  Administered 2023-04-02: 10 mg via INTRAVENOUS
  Filled 2023-04-02: qty 1

## 2023-04-02 MED ORDER — SODIUM CHLORIDE 0.9 % IV SOLN
6.0000 mg/kg | Freq: Once | INTRAVENOUS | Status: AC
  Administered 2023-04-02: 400 mg via INTRAVENOUS
  Filled 2023-04-02: qty 20

## 2023-04-02 MED ORDER — SODIUM CHLORIDE 0.9 % IV SOLN: INTRAVENOUS | Status: AC

## 2023-04-02 MED ORDER — POTASSIUM CHLORIDE 10 MEQ/100ML IV SOLN
10.0000 meq | INTRAVENOUS | Status: AC
  Administered 2023-04-02 (×2): 10 meq via INTRAVENOUS
  Filled 2023-04-02: qty 100

## 2023-04-02 MED ORDER — HEPARIN SOD (PORK) LOCK FLUSH 100 UNIT/ML IV SOLN
500.0000 [IU] | Freq: Once | INTRAVENOUS | Status: AC | PRN
Start: 2023-04-02 — End: 2023-04-02
  Administered 2023-04-02: 500 [IU]

## 2023-04-02 MED ORDER — SODIUM CHLORIDE 0.9% FLUSH
10.0000 mL | Freq: Once | INTRAVENOUS | Status: AC
  Administered 2023-04-02: 10 mL

## 2023-04-02 MED ORDER — SODIUM CHLORIDE 0.9% FLUSH
10.0000 mL | INTRAVENOUS | Status: DC | PRN
  Administered 2023-04-02: 10 mL

## 2023-04-02 MED ORDER — LACTULOSE 10 GM/15ML PO SOLN
10.0000 g | Freq: Every day | ORAL | 2 refills | Status: DC | PRN
  Filled 2023-04-02: qty 236, 7d supply, fill #0

## 2023-04-02 MED ORDER — SODIUM CHLORIDE 0.9 % IV SOLN
150.0000 mg | Freq: Once | INTRAVENOUS | Status: AC
  Administered 2023-04-02: 150 mg via INTRAVENOUS
  Filled 2023-04-02: qty 150

## 2023-04-02 MED ORDER — SODIUM CHLORIDE 0.9 % IV SOLN
125.0000 mg/m2 | Freq: Once | INTRAVENOUS | Status: AC
  Administered 2023-04-02: 200 mg via INTRAVENOUS
  Filled 2023-04-02: qty 10

## 2023-04-02 MED ORDER — SODIUM CHLORIDE 0.9 % IV SOLN
INTRAVENOUS | Status: DC
Start: 2023-04-02 — End: 2023-04-02

## 2023-04-02 MED ORDER — ATROPINE SULFATE 1 MG/ML IV SOLN
0.5000 mg | Freq: Once | INTRAVENOUS | Status: AC | PRN
  Administered 2023-04-02: 0.5 mg via INTRAVENOUS
  Filled 2023-04-02: qty 1

## 2023-04-02 MED ORDER — PALONOSETRON HCL INJECTION 0.25 MG/5ML
0.2500 mg | Freq: Once | INTRAVENOUS | Status: AC
  Administered 2023-04-02: 0.25 mg via INTRAVENOUS
  Filled 2023-04-02: qty 5

## 2023-04-02 NOTE — Progress Notes (Signed)
 CRITICAL VALUE STICKER  CRITICAL VALUE: K+ 2.7  RECEIVER (on-site recipient of call):Lucan Riner,RN  DATE & TIME NOTIFIED: 04/02/23 @ 1052  MESSENGER (representative from lab):Marchelle Folks  MD NOTIFIED: Lonna Cobb, NP  TIME OF NOTIFICATION: 1053  RESPONSE:

## 2023-04-02 NOTE — Telephone Encounter (Signed)
 Spoke with patient confirming upcoming appointment

## 2023-04-02 NOTE — Progress Notes (Signed)
 April [name] OFFICE PROGRESS NOTE   Diagnosis: Colon cancer  INTERVAL HISTORY:   April [name] returns as scheduled.  She was seen 2 days ago for recurrent abdominal pain and nausea/vomiting.  She received IV antiemetics and IV fluids.  She contacted the office yesterday to report she was feeling better and did not think she would need additional IV fluids.  She is scheduled for treatment today with irinotecan/Panitumumab.  She is feeling better.  No recent nausea/vomiting.  Good oral intake yesterday.  Last bowel movement 2 days ago.  She continues MiraLAX and lactulose.  She continues to have intermittent abdominal pain.  She reports needing supplies for the cholecystostomy tube.  Objective:  Vital signs in last 24 hours:  Blood pressure (!) 142/78, pulse 96, temperature 98.1 F (36.7 C), temperature source Temporal, resp. rate 18, height 5\' 4"  (1.626 m), weight 130 lb (59 kg), last menstrual period 01/07/2006, SpO2 100%.    HEENT: White coating over tongue.  No buccal thrush. Resp: Lungs clear bilaterally. Cardio: Regular rate and rhythm. GI: Abdomen is soft.  Drain site is unremarkable.  Tender mid abdomen.  No hepatomegaly. Vascular: No leg edema. Port-A-Cath without erythema.  Lab Results:  Lab Results  Component Value Date   WBC 7.3 04/02/2023   HGB 10.9 (L) 04/02/2023   HCT 33.4 (L) 04/02/2023   MCV 91.0 04/02/2023   PLT 364 04/02/2023   NEUTROABS 3.9 04/02/2023    Imaging:  No results found.  Medications: I have reviewed the patient's current medications.  Assessment/Plan: Sigmoid colon cancer, stage IV (pT4a,pN2b,M1c) Colonoscopy 03/24/2019-3 rectal polyps-hyperplastic polyps, distal colon biopsy-at least intramucosal adenocarcinoma, completely obstructing mass in the distal sigmoid colon, could not be traversed, intact mismatch repair protein expression 03/24/2019-CEA 56.1 03/29/2019 CT abdomen/pelvis-circumferential thickening involving the  entire mid and distal sigmoid colon to the level of the rectosigmoid junction, solid/cystic mass in the left ovary, bilateral nephrolithiasis Robotic assisted low anterior resection, mesenteric lymphadenectomy, bilateral salpingo-oophorectomy 04/08/2019 Pathology ([facility] review of outside pathology) metastatic adenocarcinoma involving the peritoneum overlying the round ligament, serosa of the urinary bladder, serosa of the right ureter a sacral area, pelvic peritoneum, and left ovary.  Omental biopsy with focal mucin pools with no carcinoma cells identified, right hemidiaphragm biopsy involved by metastatic adenocarcinoma, invasive adenocarcinoma the sigmoid colon, moderately differentiated, T4a, perineural and vascular invasion present, 7/22 lymph nodes, multiple tumor deposits, resection margins negative Negative for PD-L1, low probability of MSI-high, HER-2 negative, negative for BRAF, NRAS and KRAS alterations CTs 05/19/2019-no evidence of metastatic disease, findings suspicious for colitis of the transverse and ascending colon, small amount of ascites in the cul-de-sac, bilateral renal calculi Cycle 1 FOLFIRI 05/24/2019, bevacizumab added with cycle 2 Cycle 4 FOLFIRI/bevacizumab 07/05/2019 Cycle 5 FOLFIRI 07/27/2019, bevacizumab held secondary to hypertension Cycle 6 FOLFIRI 08/10/1999, bevacizumab held secondary to hypertension CTs 08/30/2019-no evidence of recurrent disease, new subsolid right lower lobe nodule felt to be inflammatory, emphysema Cycle 7 FOLFIRI 09/26/2019, bevacizumab remains on hold secondary to hypertension Cycle 8 FOLFIRI 10/17/2019, bevacizumab held secondary to hypertension, Udenyca added for neutropenia Cycle 9 FOLFIRI 11/07/2019, bevacizumab held secondary to hypertension, Udenyca Cycle 10 FOLFIRI 11/28/2019, bevacizumab held, Udenyca Cycle 11 FOLFIRI 12/26/2019, bevacizumab held, Udenyca CTs 01/25/2020-no evidence of recurrent disease, resolution of right lower lobe  nodule Maintenance Xeloda beginning 02/06/2020 CTs 04/25/2020- no evidence of metastatic disease, multiple bilateral renal calculi without hydronephrosis Maintenance Xeloda continued CT abdomen/pelvis without contrast 08/10/2020-bilateral staghorn renal calculi, no evidence of metastatic disease; addendum 08/23/2020-small  but increasing omental nodules. Cycle 1 FOLFIRI/panitumumab 09/19/2020 Cycle 2 FOLFIRI/Panitumumab 10/03/2020 Cycle 3 FOLFIRI/Panitumumab 10/17/2020 Cycle 4 FOLFIRI/panitumumab 10/31/2020 Cycle 5 FOLFIRI/Panitumumab 11/14/2020 CTs 11/26/2020-stable omental metastases compared to 10/22/2020, mildly decreased from 08/10/2020.  Left lower quadrant tiny paracolic gutter implant slightly decreased from CT 08/10/2020.  No new or progressive metastatic disease in the abdomen or pelvis. Cycle 6 FOLFIRI/Panitumumab 11/28/2020, irinotecan dose reduced, treatment schedule adjusted to every 3 weeks going forward Cycle 7 FOLFIRI/panitumumab 12/24/2020 Cycle 8 FOLFIRI/Panitumumab 01/16/2021--treatment held due to hypertension in the infusion area. Cycle 8 FOLFIRI/panitumumab 01/21/2021 Cycle 9 FOLFIRI/panitumumab 02/11/2021 Cycle 10 FOLFIRI/Panitumumab 03/04/2021 Cycle 11 FOLFIRI/Panitumumab 03/25/2021 CT abdomen/pelvis 04/10/2021-stable right omental and left posterior paracolic gutter nodules Cycle 12 FOLFIRI/panitumumab 04/15/2021 Cycle 13 FOLFIRI/panitumumab 05/06/2021 Cycle 14 FOLFIRI/Panitumumab 05/27/2021 Cycle 15 FOLFIRI/Panitumumab 06/17/2021 CT abdomen/pelvis 07/05/2021-slight enlargement of a dominant right omental implant, no new implants Patient requested a treatment break CT abdomen/pelvis 10/04/2021-mild increase in size of multiple omental soft tissue nodules, 4 mm calculus in the distal left ureter adjacent to the ureteral stent Cycle 1 Lonsurf 10/14/2021 10/28/2021 Avastin every 2 weeks Cycle 2 Lonsurf 11/11/2021 Cycle 3 Lonsurf 12/09/2021 Cycle 4 Lonsurf 01/06/2022 Avastin held 01/20/2022 due  to proteinuria, 24-hour urine 43 mg CT/pelvis 01/30/2022-possible new peritoneal implant at the transverse colon, no significant change in other omental/peritoneal implants, stable chronic rectal wall thickening, status post removal of double-J left ureteral stent with no evidence of hydronephrosis or ureteral calculus, nonobstructing bilateral renal calculi Cycle 5 Lonsurf 02/05/2022 or 02/06/2022 Avastin every 2 weeks Cycle 6 Lonsurf 03/03/2022 CTs 04/05/2022-increase in size and number of peritoneal implants compared to January 2024 central liver mass narrowing the anterior branch of the right portal vein, decreased biliary duct dilation, biliary stents in place, stable right cardiophrenic lymph nodes, separate pigtail stent in the lumen of the proximal duodenum Cycle 1 FOLFOX 04/14/2022 CT abdomen/pelvis 04/16/2022-stent within the third portion of the duodenum, stable peritoneal nodules, indwelling biliary stents with mild left biliary duct dilation Cycle 2 FOLFOX 04/28/2022, Emend and prophylactic dexamethasone added Cycle 3 FOLFOX 05/12/2022, Aloxi, Emend, prophylactic dexamethasone, Compazine and lorazepam as needed Chemotherapy held 05/25/2020 due to severe hypertension, evaluated in the emergency department Cycle 4 FOLFOX 06/03/2022 Cycle 5 FOLFOX 06/16/2022, oxaliplatin dose reduced and 5-FU bolus eliminated due to neutropenia CT 06/25/2022-unchanged soft tissue at the hepatic hilum, unchanged, bile duct stent, decreased size of peritoneal and omental nodules and diminished volume of likely fluid in the lower left pelvis Cycle 6 FOLFOX 06/30/2022 Cycle 7 FOLFOX 07/21/2022 CT 07/24/2022 (in the emergency department with nausea, headache, abdominal pain)-no acute pathology.  No significant interval change in omental nodularity. Cycle 8 FOLFOX 08/04/2022, pruritus on the palms at the end of the oxaliplatin infusion, Pepcid and Benadryl given, symptoms resolved, infusion not resumed Cycle 9 FOLFOX  08/18/2022-Pepcid and Benadryl added to premedications, Oxaliplatin diluted in a larger volume and infusion time increased Cycle 10 FOLFOX 09/08/2022 Cycle 11 FOLFOX 09/22/2022 10/06/2022 patient declined treatment CTs 10/15/2022-similar soft tissue fullness in the hepatic hilum with biliary stents in place, decrease in omental/peritoneal metastases Maintenance capecitabine 10/26/2022 CT abdomen/pelvis 11/19/2022-mild increase in tumor studding at the left paracolic gutter and omentum, circumferential wall thickening of the rectum, biliary stents in place with minimal increase in intrahepatic biliary dilation, nonobstructive renal calculi Colonoscopy 11/23/2022-anastomosis at 15 cm from the anal verge-patent, 2 cm polypoid tissue at the anastomosis biopsied, invasive adenocarcinoma moderately differentiated, preserved expression of major MMR proteins CTs 12/15/2022: Possible developing nodule in the lateral left liver, increased omental  nodularity, persistent wall thickening in the upper rectum CT abdomen/pelvis 12/24/2022: Increased thickened fold in the transverse colon-infectious/inflammatory, 2.1 cm lateral left liver lesion, potential new 5 mm segment 5 lesion, stable biliary stents with intrahepatic biliary dilation, central biliary wall thickening near the hepatic hilum, multiple peritoneal/omental implants unchanged from 12/15/2022, but likely larger than 11/19/2022 Cycle 1 irinotecan/Panitumumab 01/06/2023 Cycle 2 irinotecan/Panitumumab 01/20/2023 CTs 01/24/2023-slight increase in omental nodularity. Cycle 3 irinotecan/Panitumumab 02/03/2023 Cycle 4 irinotecan/panitumumab 02/17/2023 CTs abdomen/pelvis 02/24/2023-subtly hypoattenuating lesion within the peripheral aspect of hepatic segment 2 redemonstrated.  Additional scattered subcentimeter low-density lesions within the liver too small to characterize.  Prominent intrahepatic biliary dilatation with biliary stents in place.  Similar degree of nodularity  along the omentum and left paracolic gutter.  No ascites. CTs 03/09/2023-indwelling endoscopically placed stents; level of intrahepatic biliary duct dilatation is slightly improved on the left and some on the right.  Gallbladder is dilated with wall edema which is increasing.  More nodular tissue extending up along the porta hepatis.  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  Rectum with nodular wall thickening with extension into the adjacent tissue, nodular areas seen extending perirectal and presacral spaces.  Peritoneal carcinomatosis again identified. Irinotecan/Panitumumab 04/02/2023   Hypertension G4 P3, twins Kidney stones-bilateral staghorn renal calculi on CT 08/10/2020 Infected Port-A-Cath 09/12/2019-placed on Augmentin, referred for Port-A-Cath removal; Port-A-Cath removed 09/14/2019; PICC line placed 09/14/2019; culture staph aureus.  Course of Septra completed. Neutropenia secondary to chemotherapy-Udenyca added with cycle 8 FOLFIRI Right nephrostomy tube 09/12/2020; stent placed 10/09/2020, percutaneous nephrostolithotomy treatment of right-sided kidney stones 10/09/2020, malpositioned right ureter stent replacement 10/23/2020, right ureter stent removed 10/29/2020 Percutaneous left nephrostolithotomy 10/01/2021-no renal stone identified, left ureter stent left in place Cystoscopy 10/14/2021-stent removed 8.  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  2 moderate biliary strictures found in the hepatic duct system, malignant appearing.  Left and right hepatic duct and all intrahepatic branches were moderately dilated secondary to the strictures.  The strictures were dilated.  Plastic stent placed into the left hepatic duct and right hepatic duct. 9.  Admission 04/16/2022 with nausea/vomiting and abdominal pain, felt to be acute toxicity related to chemotherapy, discharged home 04/18/2022 10.  Admission 11/19/2022 with nausea/vomiting and abdominal pain-improved 11.  Admission 12/15/2022 with fever and right submandibular swelling/tenderness CT neck 12/15/2022: 2 mm distal right submandibular duct calculus with mild inflammatory change adjacent to the right submandibular gland 12.  Admission 12/24/2022 with a fever nausea/vomiting, diarrhea, Klebsiella bacteremia completed course of Augmentin 13.  Admission with abdominal pain and nausea/vomiting 03/09/2023 through 03/14/2023-concern for gallbladder inflammation.  Not felt to be a surgical candidate.  Percutaneous cholecystostomy tube placed 03/11/2023.    Disposition: April [name] appears stable.  The nausea/vomiting/abdominal pain she was experiencing earlier this week has improved.  She would like to resume treatment with irinotecan/Panitumumab today.  Plan to proceed with treatment as scheduled.  CBC and chemistry panel reviewed.  Labs adequate for treatment.  AST/ALT remain mildly elevated.  Continue to monitor.  She has  persistent hypokalemia.  She acknowledges not taking oral potassium as prescribed.  She plans to begin as per the prescription.  She will receive 20 meq potassium IV today.  Repeat BMET in 1 week when she is here for IV fluids.  She will return for follow-up and treatment in 2 weeks.  We are available to see her sooner if needed.  Lonna Cobb ANP/GNP-BC   04/02/2023  10:52 AM

## 2023-04-02 NOTE — Progress Notes (Signed)
 B/P noted to be elevated at discharge. Pt denies s/s headaches, pt encouraged to track B/P at home. Pt verbalizes understanding.

## 2023-04-02 NOTE — Patient Instructions (Signed)
 [facility] - A DEPT OF [facility]  Discharge Instructions: Thank you for choosing [facility] to provide your oncology and hematology care.   If you have a lab appointment with the Cancer Center, please go directly to the Cancer Center and check in at the registration area.   Wear comfortable clothing and clothing appropriate for easy access to any Portacath or PICC line.   We strive to give you quality time with your provider. You may need to reschedule your appointment if you arrive late (15 or more minutes).  Arriving late affects you and other patients whose appointments are after yours.  Also, if you miss three or more appointments without notifying the office, you may be dismissed from the clinic at the provider's discretion.      For prescription refill requests, have your pharmacy contact our office and allow 72 hours for refills to be completed.    Today you received the following chemotherapy and/or immunotherapy agents Vectibix and Irinotecan      To help prevent nausea and vomiting after your treatment, we encourage you to take your nausea medication as directed.  BELOW ARE SYMPTOMS THAT SHOULD BE REPORTED IMMEDIATELY: *FEVER GREATER THAN 100.4 F (38 C) OR HIGHER *CHILLS OR SWEATING *NAUSEA AND VOMITING THAT IS NOT CONTROLLED WITH YOUR NAUSEA MEDICATION *UNUSUAL SHORTNESS OF BREATH *UNUSUAL BRUISING OR BLEEDING *URINARY PROBLEMS (pain or burning when urinating, or frequent urination) *BOWEL PROBLEMS (unusual diarrhea, constipation, pain near the anus) TENDERNESS IN MOUTH AND THROAT WITH OR WITHOUT PRESENCE OF ULCERS (sore throat, sores in mouth, or a toothache) UNUSUAL RASH, SWELLING OR PAIN  UNUSUAL VAGINAL DISCHARGE OR ITCHING   Items with * indicate a potential emergency and should be followed up as soon as possible or go to the Emergency Department if any problems should occur.  Please show the CHEMOTHERAPY ALERT CARD or  IMMUNOTHERAPY ALERT CARD at check-in to the Emergency Department and triage nurse.  Should you have questions after your visit or need to cancel or reschedule your appointment, please contact [facility] - A DEPT OF [facility]  Dept: [phone]  and follow the prompts.  Office hours are 8:00 a.m. to 4:30 p.m. Monday - Friday. Please note that voicemails left after 4:00 p.m. may not be returned until the following business day.  We are closed weekends and major holidays. You have access to a nurse at all times for urgent questions. Please call the main number to the clinic Dept: [phone] and follow the prompts.   For any non-urgent questions, you may also contact your provider using MyChart. We now offer e-Visits for anyone 72 and older to request care online for non-urgent symptoms. For details visit [email or web address].   Also download the MyChart app! Go to the app store, search "MyChart", open the app, select [facility], and log in with your MyChart username and password.

## 2023-04-02 NOTE — Progress Notes (Signed)
 Patient seen by Lonna Cobb NP today  Vitals are within treatment parameters:Yes   Labs are within treatment parameters: No (Please specify and give further instructions.) K+ 2.7 AST 110 she will be receiving  potassium 20 meq IV today.  Treatment plan has been signed: Yes   Per physician team, Patient is ready for treatment and there are NO modifications to the treatment plan.

## 2023-04-09 ENCOUNTER — Inpatient Hospital Stay

## 2023-04-09 ENCOUNTER — Other Ambulatory Visit: Payer: Self-pay

## 2023-04-09 ENCOUNTER — Other Ambulatory Visit: Payer: Self-pay | Admitting: Oncology

## 2023-04-09 DIAGNOSIS — Z5112 Encounter for antineoplastic immunotherapy: Secondary | ICD-10-CM | POA: Diagnosis not present

## 2023-04-09 DIAGNOSIS — C19 Malignant neoplasm of rectosigmoid junction: Secondary | ICD-10-CM

## 2023-04-09 DIAGNOSIS — C20 Malignant neoplasm of rectum: Secondary | ICD-10-CM

## 2023-04-09 DIAGNOSIS — Z95828 Presence of other vascular implants and grafts: Secondary | ICD-10-CM

## 2023-04-09 LAB — BASIC METABOLIC PANEL - CANCER CENTER ONLY
Anion gap: 8 (ref 5–15)
BUN: 8 mg/dL (ref 6–20)
CO2: 30 mmol/L (ref 22–32)
Calcium: 9 mg/dL (ref 8.9–10.3)
Chloride: 98 mmol/L (ref 98–111)
Creatinine: 0.58 mg/dL (ref 0.44–1.00)
GFR, Estimated: >60 mL/min (ref >60)
Glucose, Bld: 120 mg/dL — ABNORMAL HIGH (ref 70–99)
Potassium: 2.9 mmol/L — ABNORMAL LOW (ref 3.5–5.1)
Sodium: 136 mmol/L (ref 135–145)

## 2023-04-09 MED ORDER — SODIUM CHLORIDE 0.9% FLUSH
10.0000 mL | Freq: Once | INTRAVENOUS | Status: AC
  Administered 2023-04-09: 10 mL

## 2023-04-09 MED ORDER — HEPARIN SOD (PORK) LOCK FLUSH 100 UNIT/ML IV SOLN
500.0000 [IU] | Freq: Once | INTRAVENOUS | Status: AC
  Administered 2023-04-09: 500 [IU]

## 2023-04-09 NOTE — Progress Notes (Signed)
 Pt. declines IV fluids. Picc line dresing completed. Pt. left via ambulation, no respiratory distress noted.

## 2023-04-10 ENCOUNTER — Telehealth: Payer: Self-pay | Admitting: Nurse Practitioner

## 2023-04-10 NOTE — Telephone Encounter (Signed)
===  View-only below this line=== ----- Message ----- From: Jacklynn Lewis, LPN Sent: 1/61/0960   1:19 PM EDT To:   Misty Stanley , she said she was sick and did miss some doses. She said please don't up the dose she is aware and will take them like she is supposed too :) ----- Message ----- From: Rana Snare, NP Sent: 04/10/2023   8:59 AM EDT To: [facility]  Please call her-potassium remains low.  Is she taking potassium as prescribed on a consistent basis?  Has she missed any doses?

## 2023-04-13 ENCOUNTER — Other Ambulatory Visit: Payer: Self-pay

## 2023-04-15 ENCOUNTER — Inpatient Hospital Stay: Admitting: Nurse Practitioner

## 2023-04-15 NOTE — Progress Notes (Deleted)
  Shawna Clamp, DNP, AGNP-c [facility] [address] [place], [place] [address] Main Office [phone]  ESTABLISHED PATIENT- Hospital Follow-Up Visit  Last menstrual period 01/07/2006.  HPI  April [name]  is a 48 y.o. year old female presenting today for evaluation and management following hospitalization.  Discussed the use of AI scribe software for clinical note transcription with the patient, who gave verbal consent to proceed.   Date of Hospitalization: 03/11-03/13 Admitted: Yes Primary Diagnosis: Hypertensive urgency New Medications: Yes: clonidiine 0.1mg  TID D/C'd Chronic Medications: Yes: zofran New Providers: No Speciality F/U Scheduled: Yes Lab/Imaging Concerns that need further evaluation: Clonidine may need further adjustment per discharge paperwork.  She also needs to follow with IR for the perc drain from cholecystectomy tube  [name] tells me today:      ROS All ROS negative with exception of what is listed in HPI  PHYSICAL EXAM Physical Exam   ASSESSMENT & PLAN Problem List Items Addressed This Visit   None  .[email or web address] FOLLOW-UP   Shawna Clamp, DNP, AGNP-c   History, Medications, Surgery, SDOH, and Family History reviewed and updated as appropriate.  Health Maintenance Schedule Health Maintenance reviewed - patient asked to schedule her pap smear, Patient to check with oncology about pneumococcal vaccine.

## 2023-04-16 ENCOUNTER — Inpatient Hospital Stay (HOSPITAL_BASED_OUTPATIENT_CLINIC_OR_DEPARTMENT_OTHER): Admitting: Oncology

## 2023-04-16 ENCOUNTER — Other Ambulatory Visit (HOSPITAL_BASED_OUTPATIENT_CLINIC_OR_DEPARTMENT_OTHER): Payer: Self-pay

## 2023-04-16 ENCOUNTER — Inpatient Hospital Stay

## 2023-04-16 ENCOUNTER — Inpatient Hospital Stay: Attending: Oncology

## 2023-04-16 ENCOUNTER — Telehealth: Payer: Self-pay

## 2023-04-16 VITALS — BP 140/80 | HR 70 | Temp 97.7°F | Resp 18

## 2023-04-16 DIAGNOSIS — C19 Malignant neoplasm of rectosigmoid junction: Secondary | ICD-10-CM

## 2023-04-16 DIAGNOSIS — Z5111 Encounter for antineoplastic chemotherapy: Secondary | ICD-10-CM | POA: Insufficient documentation

## 2023-04-16 DIAGNOSIS — C187 Malignant neoplasm of sigmoid colon: Secondary | ICD-10-CM | POA: Insufficient documentation

## 2023-04-16 DIAGNOSIS — Z95828 Presence of other vascular implants and grafts: Secondary | ICD-10-CM

## 2023-04-16 DIAGNOSIS — C7989 Secondary malignant neoplasm of other specified sites: Secondary | ICD-10-CM | POA: Insufficient documentation

## 2023-04-16 DIAGNOSIS — Z452 Encounter for adjustment and management of vascular access device: Secondary | ICD-10-CM | POA: Insufficient documentation

## 2023-04-16 DIAGNOSIS — Z5112 Encounter for antineoplastic immunotherapy: Secondary | ICD-10-CM | POA: Diagnosis present

## 2023-04-16 DIAGNOSIS — C7962 Secondary malignant neoplasm of left ovary: Secondary | ICD-10-CM | POA: Diagnosis not present

## 2023-04-16 DIAGNOSIS — Z79634 Long term (current) use of topoisomerase inhibitor: Secondary | ICD-10-CM | POA: Insufficient documentation

## 2023-04-16 DIAGNOSIS — C786 Secondary malignant neoplasm of retroperitoneum and peritoneum: Secondary | ICD-10-CM | POA: Diagnosis not present

## 2023-04-16 DIAGNOSIS — Z79899 Other long term (current) drug therapy: Secondary | ICD-10-CM | POA: Diagnosis not present

## 2023-04-16 LAB — CBC WITH DIFFERENTIAL (CANCER CENTER ONLY)
Abs Immature Granulocytes: 0.02 10*3/uL (ref 0.00–0.07)
Basophils Absolute: 0 10*3/uL (ref 0.0–0.1)
Basophils Relative: 1 %
Eosinophils Absolute: 0.4 10*3/uL (ref 0.0–0.5)
Eosinophils Relative: 5 %
HCT: 32.8 % — ABNORMAL LOW (ref 36.0–46.0)
Hemoglobin: 10.8 g/dL — ABNORMAL LOW (ref 12.0–15.0)
Immature Granulocytes: 0 %
Lymphocytes Relative: 33 %
Lymphs Abs: 2.8 10*3/uL (ref 0.7–4.0)
MCH: 29.8 pg (ref 26.0–34.0)
MCHC: 32.9 g/dL (ref 30.0–36.0)
MCV: 90.6 fL (ref 80.0–100.0)
Monocytes Absolute: 0.6 10*3/uL (ref 0.1–1.0)
Monocytes Relative: 8 %
Neutro Abs: 4.6 10*3/uL (ref 1.7–7.7)
Neutrophils Relative %: 53 %
Platelet Count: 336 10*3/uL (ref 150–400)
RBC: 3.62 MIL/uL — ABNORMAL LOW (ref 3.87–5.11)
RDW: 16.2 % — ABNORMAL HIGH (ref 11.5–15.5)
WBC Count: 8.5 10*3/uL (ref 4.0–10.5)
nRBC: 0 % (ref 0.0–0.2)

## 2023-04-16 LAB — CEA (ACCESS): CEA (CHCC): 14.01 ng/mL — ABNORMAL HIGH (ref 0.00–5.00)

## 2023-04-16 LAB — CMP (CANCER CENTER ONLY)
ALT: 66 U/L — ABNORMAL HIGH (ref 0–44)
AST: 89 U/L — ABNORMAL HIGH (ref 15–41)
Albumin: 4.1 g/dL (ref 3.5–5.0)
Alkaline Phosphatase: 859 U/L — ABNORMAL HIGH (ref 38–126)
Anion gap: 9 (ref 5–15)
BUN: 6 mg/dL (ref 6–20)
CO2: 31 mmol/L (ref 22–32)
Calcium: 9.6 mg/dL (ref 8.9–10.3)
Chloride: 96 mmol/L — ABNORMAL LOW (ref 98–111)
Creatinine: 0.39 mg/dL — ABNORMAL LOW (ref 0.44–1.00)
GFR, Estimated: >60 mL/min (ref >60)
Glucose, Bld: 82 mg/dL (ref 70–99)
Potassium: 3.3 mmol/L — ABNORMAL LOW (ref 3.5–5.1)
Sodium: 136 mmol/L (ref 135–145)
Total Bilirubin: 0.5 mg/dL (ref 0.0–1.2)
Total Protein: 7.8 g/dL (ref 6.5–8.1)

## 2023-04-16 LAB — MAGNESIUM: Magnesium: 1.9 mg/dL (ref 1.7–2.4)

## 2023-04-16 MED ORDER — SODIUM CHLORIDE 0.9% FLUSH
10.0000 mL | INTRAVENOUS | Status: DC | PRN
  Administered 2023-04-16: 10 mL

## 2023-04-16 MED ORDER — SODIUM CHLORIDE 0.9 % IV SOLN: INTRAVENOUS | Status: DC

## 2023-04-16 MED ORDER — SODIUM CHLORIDE 0.9 % IV SOLN
6.0000 mg/kg | Freq: Once | INTRAVENOUS | Status: AC
  Administered 2023-04-16: 400 mg via INTRAVENOUS
  Filled 2023-04-16: qty 20

## 2023-04-16 MED ORDER — HYDROMORPHONE HCL 4 MG PO TABS
4.0000 mg | ORAL_TABLET | ORAL | 0 refills | Status: DC | PRN
  Filled 2023-04-16: qty 100, 17d supply, fill #0

## 2023-04-16 MED ORDER — PALONOSETRON HCL INJECTION 0.25 MG/5ML
0.2500 mg | Freq: Once | INTRAVENOUS | Status: AC
  Administered 2023-04-16: 0.25 mg via INTRAVENOUS
  Filled 2023-04-16: qty 5

## 2023-04-16 MED ORDER — SODIUM CHLORIDE 0.9% FLUSH
10.0000 mL | Freq: Once | INTRAVENOUS | Status: AC
Start: 2023-04-16 — End: 2023-04-16
  Administered 2023-04-16: 10 mL

## 2023-04-16 MED ORDER — SODIUM CHLORIDE 0.9 % IV SOLN
150.0000 mg | Freq: Once | INTRAVENOUS | Status: AC
  Administered 2023-04-16: 150 mg via INTRAVENOUS
  Filled 2023-04-16: qty 150

## 2023-04-16 MED ORDER — SODIUM CHLORIDE 0.9 % IV SOLN
125.0000 mg/m2 | Freq: Once | INTRAVENOUS | Status: AC
  Administered 2023-04-16: 200 mg via INTRAVENOUS
  Filled 2023-04-16: qty 10

## 2023-04-16 MED ORDER — DEXAMETHASONE 4 MG PO TABS
4.0000 mg | ORAL_TABLET | Freq: Two times a day (BID) | ORAL | 2 refills | Status: DC
Start: 2023-04-16 — End: 2023-06-09
  Filled 2023-04-16: qty 12, 6d supply, fill #0

## 2023-04-16 MED ORDER — ATROPINE SULFATE 1 MG/ML IV SOLN
0.5000 mg | Freq: Once | INTRAVENOUS | Status: AC | PRN
  Administered 2023-04-16: 0.5 mg via INTRAVENOUS
  Filled 2023-04-16: qty 1

## 2023-04-16 MED ORDER — HEPARIN SOD (PORK) LOCK FLUSH 100 UNIT/ML IV SOLN
500.0000 [IU] | Freq: Once | INTRAVENOUS | Status: AC | PRN
  Administered 2023-04-16: 500 [IU]

## 2023-04-16 MED ORDER — DEXAMETHASONE SODIUM PHOSPHATE 10 MG/ML IJ SOLN
10.0000 mg | Freq: Once | INTRAMUSCULAR | Status: AC
  Administered 2023-04-16: 10 mg via INTRAVENOUS
  Filled 2023-04-16: qty 1

## 2023-04-16 NOTE — Progress Notes (Signed)
 [facility] OFFICE PROGRESS NOTE   Diagnosis: Colon cancer  INTERVAL HISTORY:   April [name] returns as scheduled.  She completed another treat with irinotecan/panitumumab on 04/02/2023.  No nausea/vomiting.  She reports stable abdominal pain.  She takes Dilaudid 4 times daily.  No diarrhea.  She takes MiraLAX daily.  She reports abdominal cramping following irinotecan.  Objective:  Vital signs in last 24 hours:  Blood pressure (!) 142/74, pulse (!) 104, temperature 98.1 F (36.7 C), temperature source Temporal, resp. rate 18, height 5\' 4"  (1.626 m), weight 131 lb 6.4 oz (59.6 kg), last menstrual period 01/07/2006, SpO2 98%.    HEENT: Rush or ulcers Resp: Lungs clear bilaterally Cardio: Regular rate and rhythm GI: No hepatosplenomegaly, right abdomen gallbladder drain site without evidence of infection Vascular: No leg edema  Skin: Mild acne type rash surrounding the nose  Portacath/PICC-without erythema  Lab Results:  Lab Results  Component Value Date   WBC 8.5 04/16/2023   HGB 10.8 (L) 04/16/2023   HCT 32.8 (L) 04/16/2023   MCV 90.6 04/16/2023   PLT 336 04/16/2023   NEUTROABS 4.6 04/16/2023    CMP  Lab Results  Component Value Date   NA 136 04/16/2023   K 3.3 (L) 04/16/2023   CL 96 (L) 04/16/2023   CO2 31 04/16/2023   GLUCOSE 82 04/16/2023   BUN 6 04/16/2023   CREATININE 0.39 (L) 04/16/2023   CALCIUM 9.6 04/16/2023   PROT 7.8 04/16/2023   ALBUMIN 4.1 04/16/2023   AST 89 (H) 04/16/2023   ALT 66 (H) 04/16/2023   ALKPHOS 859 (H) 04/16/2023   BILITOT 0.5 04/16/2023   GFRNONAA >60 04/16/2023   GFRAA >60 10/17/2019    Lab Results  Component Value Date   CEA1 <0.6 03/23/2022   CEA 14.01 (H) 04/16/2023   ZOX096 213 (H) 03/23/2022    Medications: I have reviewed the patient's current medications.   Assessment/Plan: Sigmoid colon cancer, stage IV (pT4a,pN2b,M1c) Colonoscopy 03/24/2019-3 rectal polyps-hyperplastic polyps, distal colon  biopsy-at least intramucosal adenocarcinoma, completely obstructing mass in the distal sigmoid colon, could not be traversed, intact mismatch repair protein expression 03/24/2019-CEA 56.1 03/29/2019 CT abdomen/pelvis-circumferential thickening involving the entire mid and distal sigmoid colon to the level of the rectosigmoid junction, solid/cystic mass in the left ovary, bilateral nephrolithiasis Robotic assisted low anterior resection, mesenteric lymphadenectomy, bilateral salpingo-oophorectomy 04/08/2019 Pathology ([facility] review of outside pathology) metastatic adenocarcinoma involving the peritoneum overlying the round ligament, serosa of the urinary bladder, serosa of the right ureter a sacral area, pelvic peritoneum, and left ovary.  Omental biopsy with focal mucin pools with no carcinoma cells identified, right hemidiaphragm biopsy involved by metastatic adenocarcinoma, invasive adenocarcinoma the sigmoid colon, moderately differentiated, T4a, perineural and vascular invasion present, 7/22 lymph nodes, multiple tumor deposits, resection margins negative Negative for PD-L1, low probability of MSI-high, HER-2 negative, negative for BRAF, NRAS and KRAS alterations CTs 05/19/2019-no evidence of metastatic disease, findings suspicious for colitis of the transverse and ascending colon, small amount of ascites in the cul-de-sac, bilateral renal calculi Cycle 1 FOLFIRI 05/24/2019, bevacizumab added with cycle 2 Cycle 4 FOLFIRI/bevacizumab 07/05/2019 Cycle 5 FOLFIRI 07/27/2019, bevacizumab held secondary to hypertension Cycle 6 FOLFIRI 08/10/1999, bevacizumab held secondary to hypertension CTs 08/30/2019-no evidence of recurrent disease, new subsolid right lower lobe nodule felt to be inflammatory, emphysema Cycle 7 FOLFIRI 09/26/2019, bevacizumab remains on hold secondary to hypertension Cycle 8 FOLFIRI 10/17/2019, bevacizumab held secondary to hypertension, Udenyca added for neutropenia Cycle 9 FOLFIRI 11/07/2019,  bevacizumab held  secondary to hypertension, Udenyca Cycle 10 FOLFIRI 11/28/2019, bevacizumab held, Udenyca Cycle 11 FOLFIRI 12/26/2019, bevacizumab held, Udenyca CTs 01/25/2020-no evidence of recurrent disease, resolution of right lower lobe nodule Maintenance Xeloda beginning 02/06/2020 CTs 04/25/2020- no evidence of metastatic disease, multiple bilateral renal calculi without hydronephrosis Maintenance Xeloda continued CT abdomen/pelvis without contrast 08/10/2020-bilateral staghorn renal calculi, no evidence of metastatic disease; addendum 08/23/2020-small but increasing omental nodules. Cycle 1 FOLFIRI/panitumumab 09/19/2020 Cycle 2 FOLFIRI/Panitumumab 10/03/2020 Cycle 3 FOLFIRI/Panitumumab 10/17/2020 Cycle 4 FOLFIRI/panitumumab 10/31/2020 Cycle 5 FOLFIRI/Panitumumab 11/14/2020 CTs 11/26/2020-stable omental metastases compared to 10/22/2020, mildly decreased from 08/10/2020.  Left lower quadrant tiny paracolic gutter implant slightly decreased from CT 08/10/2020.  No new or progressive metastatic disease in the abdomen or pelvis. Cycle 6 FOLFIRI/Panitumumab 11/28/2020, irinotecan dose reduced, treatment schedule adjusted to every 3 weeks going forward Cycle 7 FOLFIRI/panitumumab 12/24/2020 Cycle 8 FOLFIRI/Panitumumab 01/16/2021--treatment held due to hypertension in the infusion area. Cycle 8 FOLFIRI/panitumumab 01/21/2021 Cycle 9 FOLFIRI/panitumumab 02/11/2021 Cycle 10 FOLFIRI/Panitumumab 03/04/2021 Cycle 11 FOLFIRI/Panitumumab 03/25/2021 CT abdomen/pelvis 04/10/2021-stable right omental and left posterior paracolic gutter nodules Cycle 12 FOLFIRI/panitumumab 04/15/2021 Cycle 13 FOLFIRI/panitumumab 05/06/2021 Cycle 14 FOLFIRI/Panitumumab 05/27/2021 Cycle 15 FOLFIRI/Panitumumab 06/17/2021 CT abdomen/pelvis 07/05/2021-slight enlargement of a dominant right omental implant, no new implants Patient requested a treatment break CT abdomen/pelvis 10/04/2021-mild increase in size of multiple omental soft tissue  nodules, 4 mm calculus in the distal left ureter adjacent to the ureteral stent Cycle 1 Lonsurf 10/14/2021 10/28/2021 Avastin every 2 weeks Cycle 2 Lonsurf 11/11/2021 Cycle 3 Lonsurf 12/09/2021 Cycle 4 Lonsurf 01/06/2022 Avastin held 01/20/2022 due to proteinuria, 24-hour urine 43 mg CT/pelvis 01/30/2022-possible new peritoneal implant at the transverse colon, no significant change in other omental/peritoneal implants, stable chronic rectal wall thickening, status post removal of double-J left ureteral stent with no evidence of hydronephrosis or ureteral calculus, nonobstructing bilateral renal calculi Cycle 5 Lonsurf 02/05/2022 or 02/06/2022 Avastin every 2 weeks Cycle 6 Lonsurf 03/03/2022 CTs 04/05/2022-increase in size and number of peritoneal implants compared to January 2024 central liver mass narrowing the anterior branch of the right portal vein, decreased biliary duct dilation, biliary stents in place, stable right cardiophrenic lymph nodes, separate pigtail stent in the lumen of the proximal duodenum Cycle 1 FOLFOX 04/14/2022 CT abdomen/pelvis 04/16/2022-stent within the third portion of the duodenum, stable peritoneal nodules, indwelling biliary stents with mild left biliary duct dilation Cycle 2 FOLFOX 04/28/2022, Emend and prophylactic dexamethasone added Cycle 3 FOLFOX 05/12/2022, Aloxi, Emend, prophylactic dexamethasone, Compazine and lorazepam as needed Chemotherapy held 05/25/2020 due to severe hypertension, evaluated in the emergency department Cycle 4 FOLFOX 06/03/2022 Cycle 5 FOLFOX 06/16/2022, oxaliplatin dose reduced and 5-FU bolus eliminated due to neutropenia CT 06/25/2022-unchanged soft tissue at the hepatic hilum, unchanged, bile duct stent, decreased size of peritoneal and omental nodules and diminished volume of likely fluid in the lower left pelvis Cycle 6 FOLFOX 06/30/2022 Cycle 7 FOLFOX 07/21/2022 CT 07/24/2022 (in the emergency department with nausea, headache, abdominal pain)-no acute  pathology.  No significant interval change in omental nodularity. Cycle 8 FOLFOX 08/04/2022, pruritus on the palms at the end of the oxaliplatin infusion, Pepcid and Benadryl given, symptoms resolved, infusion not resumed Cycle 9 FOLFOX 08/18/2022-Pepcid and Benadryl added to premedications, Oxaliplatin diluted in a larger volume and infusion time increased Cycle 10 FOLFOX 09/08/2022 Cycle 11 FOLFOX 09/22/2022 10/06/2022 patient declined treatment CTs 10/15/2022-similar soft tissue fullness in the hepatic hilum with biliary stents in place, decrease in omental/peritoneal metastases Maintenance capecitabine 10/26/2022 CT abdomen/pelvis 11/19/2022-mild increase in tumor studding at  the left paracolic gutter and omentum, circumferential wall thickening of the rectum, biliary stents in place with minimal increase in intrahepatic biliary dilation, nonobstructive renal calculi Colonoscopy 11/23/2022-anastomosis at 15 cm from the anal verge-patent, 2 cm polypoid tissue at the anastomosis biopsied, invasive adenocarcinoma moderately differentiated, preserved expression of major MMR proteins CTs 12/15/2022: Possible developing nodule in the lateral left liver, increased omental nodularity, persistent wall thickening in the upper rectum CT abdomen/pelvis 12/24/2022: Increased thickened fold in the transverse colon-infectious/inflammatory, 2.1 cm lateral left liver lesion, potential new 5 mm segment 5 lesion, stable biliary stents with intrahepatic biliary dilation, central biliary wall thickening near the hepatic hilum, multiple peritoneal/omental implants unchanged from 12/15/2022, but likely larger than 11/19/2022 Cycle 1 irinotecan/Panitumumab 01/06/2023 Cycle 2 irinotecan/Panitumumab 01/20/2023 CTs 01/24/2023-slight increase in omental nodularity. Cycle 3 irinotecan/Panitumumab 02/03/2023 Cycle 4 irinotecan/panitumumab 02/17/2023 CTs abdomen/pelvis 02/24/2023-subtly hypoattenuating lesion within the peripheral aspect of  hepatic segment 2 redemonstrated.  Additional scattered subcentimeter low-density lesions within the liver too small to characterize.  Prominent intrahepatic biliary dilatation with biliary stents in place.  Similar degree of nodularity along the omentum and left paracolic gutter.  No ascites. CTs 03/09/2023-indwelling endoscopically placed stents; level of intrahepatic biliary duct dilatation is slightly improved on the left and some on the right.  Gallbladder is dilated with wall edema which is increasing.  More nodular tissue extending up along the porta hepatis.  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  Rectum with nodular wall thickening with extension into the adjacent tissue, nodular areas seen extending perirectal and presacral spaces.  Peritoneal carcinomatosis again identified. Irinotecan/Panitumumab 04/02/2023 Irinotecan/panitumumab 04/16/2023   Hypertension G4 P3, twins Kidney stones-bilateral staghorn renal calculi on CT 08/10/2020 Infected Port-A-Cath 09/12/2019-placed on Augmentin, referred for Port-A-Cath removal; Port-A-Cath removed 09/14/2019; PICC line placed 09/14/2019; culture staph aureus.  Course of Septra completed. Neutropenia secondary to chemotherapy-Udenyca added with cycle 8 FOLFIRI Right nephrostomy tube 09/12/2020; stent placed 10/09/2020, percutaneous nephrostolithotomy treatment of right-sided kidney stones 10/09/2020, malpositioned right ureter stent replacement 10/23/2020, right ureter stent removed 10/29/2020 Percutaneous left nephrostolithotomy 10/01/2021-no renal stone identified, left ureter stent left in place Cystoscopy 10/14/2021-stent removed 8.  Admission 03/21/2022 with new onset jaundice, abdominal pain, nausea MRI abdomen 03/22/2022-central liver mass with obstruction at the confluence of the intrahepatic ducts and proximal common hepatic duct with severe Intermatic biliary ductal dilatation, progressive peritoneal carcinomatosis ERCP 03/24/2022-severe biliary  stricture in the hepatic duct affecting the left and right hepatic duct, common hepatic duct, and bifurcation, malignant appearing, temporary plastic pancreatic stent, left and right hepatic duct stents were placed ERCP 02/25/2018 25-2 visibly patent stents from the biliary tree were seen in the major papilla, removed.  2 moderate biliary strictures found in the hepatic duct system, malignant appearing.  Left and right hepatic duct and all intrahepatic branches were moderately dilated secondary to the strictures.  The strictures were dilated.  Plastic stent placed into the left hepatic duct and right hepatic duct. 9.  Admission 04/16/2022 with nausea/vomiting and abdominal pain, felt to be acute toxicity related to chemotherapy, discharged home 04/18/2022 10.  Admission 11/19/2022 with nausea/vomiting and abdominal pain-improved 11.  Admission 12/15/2022 with fever and right submandibular swelling/tenderness CT neck 12/15/2022: 2 mm distal right submandibular duct calculus with mild inflammatory change adjacent to the right submandibular gland 12.  Admission 12/24/2022 with a fever nausea/vomiting, diarrhea, Klebsiella bacteremia completed course of Augmentin 13.  Admission with abdominal pain and nausea/vomiting 03/09/2023 through 03/14/2023-concern for gallbladder inflammation.  Not felt to be a surgical candidate.  Percutaneous cholecystostomy tube placed 03/11/2023.      Disposition: April [name] appears stable.  The CEA is lower today.  She will complete another cycle irinotecan/panitumumab today.  She would like to continue irinotecan/panitumumab only 3 weeks scheduled.  She return for an office and lab visit in 3 weeks.  She will return for a weekly PICC dressing change.  I refilled the prescription for Dilaudid.  Thornton Papas, MD  04/16/2023  11:24 AM

## 2023-04-16 NOTE — Progress Notes (Signed)
 Patient seen by Dr. Thornton Papas today  Vitals are within treatment parameters:No (Please specify and give further instructions.) pulse 104  Labs are within treatment parameters: AST 89 K+ 3.3. it's okay to proceed.  Treatment plan has been signed: Yes   Per physician team, Patient is ready for treatment and there are NO modifications to the treatment plan.

## 2023-04-16 NOTE — Patient Instructions (Signed)
 [facility]   Discharge Instructions: Thank you for choosing [facility] to provide your oncology and hematology care.   If you have a lab appointment with the Cancer Center, please go directly to the Cancer Center and check in at the registration area.   Wear comfortable clothing and clothing appropriate for easy access to any Portacath or PICC line.   We strive to give you quality time with your provider. You may need to reschedule your appointment if you arrive late (15 or more minutes).  Arriving late affects you and other patients whose appointments are after yours.  Also, if you miss three or more appointments without notifying the office, you may be dismissed from the clinic at the provider's discretion.      For prescription refill requests, have your pharmacy contact our office and allow 72 hours for refills to be completed.    Today you received the following chemotherapy and/or immunotherapy agents Vectibix, Irinotecan      To help prevent nausea and vomiting after your treatment, we encourage you to take your nausea medication as directed.  BELOW ARE SYMPTOMS THAT SHOULD BE REPORTED IMMEDIATELY: *FEVER GREATER THAN 100.4 F (38 C) OR HIGHER *CHILLS OR SWEATING *NAUSEA AND VOMITING THAT IS NOT CONTROLLED WITH YOUR NAUSEA MEDICATION *UNUSUAL SHORTNESS OF BREATH *UNUSUAL BRUISING OR BLEEDING *URINARY PROBLEMS (pain or burning when urinating, or frequent urination) *BOWEL PROBLEMS (unusual diarrhea, constipation, pain near the anus) TENDERNESS IN MOUTH AND THROAT WITH OR WITHOUT PRESENCE OF ULCERS (sore throat, sores in mouth, or a toothache) UNUSUAL RASH, SWELLING OR PAIN  UNUSUAL VAGINAL DISCHARGE OR ITCHING   Items with * indicate a potential emergency and should be followed up as soon as possible or go to the Emergency Department if any problems should occur.  Please show the CHEMOTHERAPY ALERT CARD or  IMMUNOTHERAPY ALERT CARD at check-in to the Emergency Department and triage nurse.  Should you have questions after your visit or need to cancel or reschedule your appointment, please contact [facility]  Dept: [phone]  and follow the prompts.  Office hours are 8:00 a.m. to 4:30 p.m. Monday - Friday. Please note that voicemails left after 4:00 p.m. may not be returned until the following business day.  We are closed weekends and major holidays. You have access to a nurse at all times for urgent questions. Please call the main number to the clinic Dept: [phone] and follow the prompts.   For any non-urgent questions, you may also contact your provider using MyChart. We now offer e-Visits for anyone 53 and older to request care online for non-urgent symptoms. For details visit [email or web address].   Also download the MyChart app! Go to the app store, search "MyChart", open the app, select , and log in with your MyChart username and password.  Panitumumab Injection What is this medication? PANITUMUMAB (pan i TOOM ue mab) treats colorectal cancer. It works by blocking a protein that causes cancer cells to grow and multiply. This helps to slow or stop the spread of cancer cells. It is a monoclonal antibody. This medicine may be used for other purposes; ask your health care provider or pharmacist if you have questions. COMMON BRAND NAME(S): Vectibix What should I tell my care team before I take this medication? They need to know if you have any of these conditions: Eye disease Low  levels of magnesium in the blood Lung disease An unusual or allergic reaction to panitumumab, other medications, foods, dyes, or preservatives Pregnant or trying to get pregnant Breast-feeding How should I use this medication? This medication is injected into a vein. It is given by your care team in a hospital or clinic setting. Talk to your  care team about the use of this medication in children. Special care may be needed. Overdosage: If you think you have taken too much of this medicine contact a poison control center or emergency room at once. NOTE: This medicine is only for you. Do not share this medicine with others. What if I miss a dose? Keep appointments for follow-up doses. It is important not to miss your dose. Call your care team if you are unable to keep an appointment. What may interact with this medication? Bevacizumab This list may not describe all possible interactions. Give your health care provider a list of all the medicines, herbs, non-prescription drugs, or dietary supplements you use. Also tell them if you smoke, drink alcohol, or use illegal drugs. Some items may interact with your medicine. What should I watch for while using this medication? Your condition will be monitored carefully while you are receiving this medication. This medication may make you feel generally unwell. This is not uncommon as chemotherapy can affect healthy cells as well as cancer cells. Report any side effects. Continue your course of treatment even though you feel ill unless your care team tells you to stop. This medication can make you more sensitive to the sun. Keep out of the sun while receiving this medication and for 2 months after stopping therapy. If you cannot avoid being in the sun, wear protective clothing and sunscreen. Do not use sun lamps, tanning beds, or tanning booths. Check with your care team if you have severe diarrhea, nausea, and vomiting or if you sweat a lot. The loss of too much body fluid may make it dangerous for you to take this medication. This medication may cause serious skin reactions. They can happen weeks to months after starting the medication. Contact your care team right away if you notice fevers or flu-like symptoms with a rash. The rash may be red or purple and then turn into blisters or peeling of the  skin. You may also notice a red rash with swelling of the face, lips, or lymph nodes in your neck or under your arms. Talk to your care team if you may be pregnant. Serious birth defects can occur if you take this medication during pregnancy and for 2 months after the last dose. Contraception is recommended while taking this medication and for 2 months after the last dose. Your care team can help you find the option that works for you. Do not breastfeed while taking this medication and for 2 months after the last dose. This medication may cause infertility. Talk to your care team if you are concerned about your fertility. What side effects may I notice from receiving this medication? Side effects that you should report to your care team as soon as possible: Allergic reactions--skin rash, itching, hives, swelling of the face, lips, tongue, or throat Dry cough, shortness of breath or trouble breathing Eye pain, redness, irritation, or discharge with blurry or decreased vision Infusion reactions--chest pain, shortness of breath or trouble breathing, feeling faint or lightheaded Low magnesium level--muscle pain or cramps, unusual weakness or fatigue, fast or irregular heartbeat, tremors Low potassium level--muscle pain or cramps, unusual weakness  or fatigue, fast or irregular heartbeat, constipation Redness, blistering, peeling, or loosening of the skin, including inside the mouth Skin reactions on sun-exposed areas Side effects that usually do not require medical attention (report to your care team if they continue or are bothersome): Change in nail shape, thickness, or color Diarrhea Dry skin Fatigue Nausea Vomiting This list may not describe all possible side effects. Call your doctor for medical advice about side effects. You may report side effects to FDA at 1-800-FDA-1088. Where should I keep my medication? This medication is given in a hospital or clinic. It will not be stored at  home. NOTE: This sheet is a summary. It may not cover all possible information. If you have questions about this medicine, talk to your doctor, pharmacist, or health care provider.  2024 Elsevier/Gold Standard (2021-05-15 00:00:00)  Irinotecan Injection What is this medication? IRINOTECAN (ir in oh TEE kan) treats some types of cancer. It works by slowing down the growth of cancer cells. This medicine may be used for other purposes; ask your health care provider or pharmacist if you have questions. COMMON BRAND NAME(S): Camptosar What should I tell my care team before I take this medication? They need to know if you have any of these conditions: Dehydration Diarrhea Infection, especially a viral infection, such as chickenpox, cold sores, herpes Liver disease Low blood cell levels (white cells, red cells, and platelets) Low levels of electrolytes, such as calcium, magnesium, or potassium in your blood Recent or ongoing radiation An unusual or allergic reaction to irinotecan, other medications, foods, dyes, or preservatives If you or your partner are pregnant or trying to get pregnant Breast-feeding How should I use this medication? This medication is injected into a vein. It is given by your care team in a hospital or clinic setting. Talk to your care team about the use of this medication in children. Special care may be needed. Overdosage: If you think you have taken too much of this medicine contact a poison control center or emergency room at once. NOTE: This medicine is only for you. Do not share this medicine with others. What if I miss a dose? Keep appointments for follow-up doses. It is important not to miss your dose. Call your care team if you are unable to keep an appointment. What may interact with this medication? Do not take this medication with any of the following: Cobicistat Itraconazole This medication may also interact with the following: Certain antibiotics, such  as clarithromycin, rifampin, rifabutin Certain antivirals for HIV or AIDS Certain medications for fungal infections, such as ketoconazole, posaconazole, voriconazole Certain medications for seizures, such as carbamazepine, phenobarbital, phenytoin Gemfibrozil Nefazodone St. John's wort This list may not describe all possible interactions. Give your health care provider a list of all the medicines, herbs, non-prescription drugs, or dietary supplements you use. Also tell them if you smoke, drink alcohol, or use illegal drugs. Some items may interact with your medicine. What should I watch for while using this medication? Your condition will be monitored carefully while you are receiving this medication. You may need blood work while taking this medication. This medication may make you feel generally unwell. This is not uncommon as chemotherapy can affect healthy cells as well as cancer cells. Report any side effects. Continue your course of treatment even though you feel ill unless your care team tells you to stop. This medication can cause serious side effects. To reduce the risk, your care team may give you other medications to  take before receiving this one. Be sure to follow the directions from your care team. This medication may affect your coordination, reaction time, or judgement. Do not drive or operate machinery until you know how this medication affects you. Sit up or stand slowly to reduce the risk of dizzy or fainting spells. Drinking alcohol with this medication can increase the risk of these side effects. This medication may increase your risk of getting an infection. Call your care team for advice if you get a fever, chills, sore throat, or other symptoms of a cold or flu. Do not treat yourself. Try to avoid being around people who are sick. Avoid taking medications that contain aspirin, acetaminophen, ibuprofen, naproxen, or ketoprofen unless instructed by your care team. These  medications may hide a fever. This medication may increase your risk to bruise or bleed. Call your care team if you notice any unusual bleeding. Be careful brushing or flossing your teeth or using a toothpick because you may get an infection or bleed more easily. If you have any dental work done, tell your dentist you are receiving this medication. Talk to your care team if you or your partner are pregnant or think either of you might be pregnant. This medication can cause serious birth defects if taken during pregnancy and for 6 months after the last dose. You will need a negative pregnancy test before starting this medication. Contraception is recommended while taking this medication and for 6 months after the last dose. Your care team can help you find the option that works for you. Do not father a child while taking this medication and for 3 months after the last dose. Use a condom for contraception during this time period. Do not breastfeed while taking this medication and for 7 days after the last dose. This medication may cause infertility. Talk to your care team if you are concerned about your fertility. What side effects may I notice from receiving this medication? Side effects that you should report to your care team as soon as possible: Allergic reactions--skin rash, itching, hives, swelling of the face, lips, tongue, or throat Dry cough, shortness of breath or trouble breathing Increased saliva or tears, increased sweating, stomach cramping, diarrhea, small pupils, unusual weakness or fatigue, slow heartbeat Infection--fever, chills, cough, sore throat, wounds that don't heal, pain or trouble when passing urine, general feeling of discomfort or being unwell Kidney injury--decrease in the amount of urine, swelling of the ankles, hands, or feet Low red blood cell level--unusual weakness or fatigue, dizziness, headache, trouble breathing Severe or prolonged diarrhea Unusual bruising or  bleeding Side effects that usually do not require medical attention (report to your care team if they continue or are bothersome): Constipation Diarrhea Hair loss Loss of appetite Nausea Stomach pain This list may not describe all possible side effects. Call your doctor for medical advice about side effects. You may report side effects to FDA at 1-800-FDA-1088. Where should I keep my medication? This medication is given in a hospital or clinic. It will not be stored at home. NOTE: This sheet is a summary. It may not cover all possible information. If you have questions about this medicine, talk to your doctor, pharmacist, or health care provider.  2024 Elsevier/Gold Standard (2021-05-13 00:00:00)

## 2023-04-16 NOTE — Telephone Encounter (Signed)
 [facility] CSW Progress Note  Clinical Social Worker  made final attempt to reach patient regarding Nutritional Assistance.  CSW informed patient of attempts to discuss application. CSW informed patient Dietician will make another attempt to assist patient. Patient verbalized understanding.  Marguerita Merles, LCSW Clinical Social Worker [facility]

## 2023-04-20 ENCOUNTER — Other Ambulatory Visit: Payer: Self-pay | Admitting: General Surgery

## 2023-04-20 ENCOUNTER — Ambulatory Visit
Admission: RE | Admit: 2023-04-20 | Discharge: 2023-04-20 | Disposition: A | Discharge status: Home / Self Care | Source: Ambulatory Visit | Attending: General Surgery | Admitting: General Surgery

## 2023-04-20 ENCOUNTER — Ambulatory Visit
Admission: RE | Admit: 2023-04-20 | Discharge: 2023-04-20 | Disposition: A | Discharge status: Home / Self Care | Source: Ambulatory Visit | Attending: General Surgery

## 2023-04-20 DIAGNOSIS — K810 Acute cholecystitis: Secondary | ICD-10-CM

## 2023-04-20 HISTORY — PX: IR RADIOLOGIST EVAL & MGMT: IMG5224

## 2023-04-20 NOTE — Patient Instructions (Signed)
 Referring Physician(s): Dr Meridee Score CCS  Chief Complaint: The patient is seen in follow up today s/p Successful ultrasound and fluoroscopic guided placement of a 10 Fr cholecystostomy tube 03/11/23    History of present illness:  History Colon Ca; peritoneal carcinomatosis; malignant Biliary stricture- stent placed in OR with Dr Meridee Score 03/09/23 N/V; abd pain and fever-- [place] 03/10/23: IMPRESSION: No biliary dilatation but the common duct is partially obscured by the known biliary stents. Dilated gallbladder with sludge and questionable stone. There is also significant wall thickening. Please correlate for any clinical evidence of acute cholecystitis Was determined candidate and scheduled for percutaneous cholecystostomy 2/26 in IR  Here today for evaluation of perc chole drain.   Next appt with Dr Meridee Score:   Follows with Dr Truett Perna and Palliative Care Last visit with Dr Truett Perna just 04/16/23    Past Medical History:  Diagnosis Date   Ascending aortic aneurysm (HCC) 12/16/2022   Benign essential hypertension 10/06/2017   Last Assessment & Plan:  Formatting of this note might be different from the original. Patient's blood pressure noted to be elevated at today's visit 188/98.  Patient will be provided with antihypertensives in the treatment center if needed in order to meet her Avastin parameters.  Patient recommended to follow up with her primary care physician regarding medication management.  Patient is not cur   Cancer [facility])    Change in bowel habits 06/28/2022   Colon cancer (HCC)    History of colon cancer 06/28/2022   Hypertension    Prolonged QT interval 01/07/2018   Renal calculi    bilateral   Sepsis due to undetermined organism (HCC) 12/24/2022   Sialoadenitis of submandibular gland 12/16/2022    Past Surgical History:  Procedure Laterality Date   BALLOON DILATION N/A 02/26/2023   Procedure: BALLOON DILATION;  Surgeon: Lemar Lofty., MD;   Location: [facility] ENDOSCOPY;  Service: Gastroenterology;  Laterality: N/A;   BILIARY BRUSHING  03/24/2022   Procedure: BILIARY BRUSHING;  Surgeon: Meridee Score Netty Starring., MD;  Location: [facility] ENDOSCOPY;  Service: Gastroenterology;;   BILIARY DILATION  03/24/2022   Procedure: BILIARY DILATION;  Surgeon: Lemar Lofty., MD;  Location: [facility] ENDOSCOPY;  Service: Gastroenterology;;   BILIARY STENT PLACEMENT  03/24/2022   Procedure: BILIARY STENT PLACEMENT;  Surgeon: Lemar Lofty., MD;  Location: [facility] ENDOSCOPY;  Service: Gastroenterology;;   BILIARY STENT PLACEMENT N/A 02/26/2023   Procedure: BILIARY STENT PLACEMENT;  Surgeon: Lemar Lofty., MD;  Location: [facility] ENDOSCOPY;  Service: Gastroenterology;  Laterality: N/A;   BIOPSY  03/24/2022   Procedure: BIOPSY;  Surgeon: Meridee Score Netty Starring., MD;  Location: [facility] ENDOSCOPY;  Service: Gastroenterology;;   BIOPSY  11/23/2022   Procedure: BIOPSY;  Surgeon: Hilarie Fredrickson, MD;  Location: [facility] ENDOSCOPY;  Service: Gastroenterology;;   BIOPSY  02/26/2023   Procedure: BIOPSY;  Surgeon: Lemar Lofty., MD;  Location: [facility] ENDOSCOPY;  Service: Gastroenterology;;   CESAREAN SECTION     x2   COLONOSCOPY N/A 11/23/2022   Procedure: COLONOSCOPY;  Surgeon: Hilarie Fredrickson, MD;  Location: [facility] ENDOSCOPY;  Service: Gastroenterology;  Laterality: N/A;   CYSTOSCOPY WITH RETROGRADE PYELOGRAM, URETEROSCOPY AND STENT PLACEMENT Right 10/22/2020   Procedure: CYSTOSCOPY WITH RETROGRADE PYELOGRAM, URETEROSCOPY AND STENT PLACEMENT;  Surgeon: Noel Christmas, MD;  Location: [facility] ORS;  Service: Urology;  Laterality: Right;   ENDOSCOPIC RETROGRADE CHOLANGIOPANCREATOGRAPHY (ERCP) WITH PROPOFOL N/A 02/26/2023   Procedure: ENDOSCOPIC RETROGRADE CHOLANGIOPANCREATOGRAPHY (ERCP) WITH PROPOFOL;  Surgeon: Meridee Score Netty Starring., MD;  Location: [facility] ENDOSCOPY;  Service: Gastroenterology;  Laterality: N/A;   ERCP N/A 03/24/2022   Procedure: ENDOSCOPIC RETROGRADE  CHOLANGIOPANCREATOGRAPHY (ERCP);  Surgeon: Lemar Lofty., MD;  Location: [facility] ENDOSCOPY;  Service: Gastroenterology;  Laterality: N/A;   ESOPHAGOGASTRODUODENOSCOPY (EGD) WITH PROPOFOL N/A 02/26/2023   Procedure: ESOPHAGOGASTRODUODENOSCOPY (EGD) WITH PROPOFOL;  Surgeon: Meridee Score Netty Starring., MD;  Location: [facility] ENDOSCOPY;  Service: Gastroenterology;  Laterality: N/A;  patient needs EGD in addition to ERCP   IR PATIENT EVAL TECH 0-60 MINS  12/15/2022   IR PERC CHOLECYSTOSTOMY  03/11/2023   IR RADIOLOGIST EVAL & MGMT  09/16/2019   IR RADIOLOGIST EVAL & MGMT  09/23/2019   IR RADIOLOGIST EVAL & MGMT  09/20/2019   IR RADIOLOGIST EVAL & MGMT  09/30/2019   IR REMOVAL TUN ACCESS W/ PORT W/O FL MOD SED  09/14/2019   IR VENO/EXT/UNI RIGHT  04/11/2022   PANCREATIC STENT PLACEMENT  03/24/2022   Procedure: PANCREATIC STENT PLACEMENT;  Surgeon: Lemar Lofty., MD;  Location: [facility] ENDOSCOPY;  Service: Gastroenterology;;   REMOVAL OF STONES  03/24/2022   Procedure: REMOVAL OF STONES;  Surgeon: Lemar Lofty., MD;  Location: [facility] ENDOSCOPY;  Service: Gastroenterology;;   REMOVAL OF STONES  02/26/2023   Procedure: REMOVAL OF Sludge;  Surgeon: Lemar Lofty., MD;  Location: [facility] ENDOSCOPY;  Service: Gastroenterology;;   Dennison Mascot  03/24/2022   Procedure: Dennison Mascot;  Surgeon: Lemar Lofty., MD;  Location: [facility] ENDOSCOPY;  Service: Gastroenterology;;   Francine Graven REMOVAL  02/26/2023   Procedure: STENT REMOVAL;  Surgeon: Lemar Lofty., MD;  Location: [facility] ENDOSCOPY;  Service: Gastroenterology;;   URETERAL STENT PLACEMENT      Allergies: Lisinopril, Irinotecan, Leucovorin calcium, and Oxaliplatin  Medications: Prior to Admission medications   Medication Sig Start Date End Date Taking? Authorizing Provider  amLODipine (NORVASC) 10 MG tablet Take 1 tablet (10 mg total) by mouth daily. 11/24/22   Rodolph Bong, MD  cloNIDine (CATAPRES) 0.1 MG tablet Take 1 tablet (0.1 mg  total) by mouth 3 (three) times daily. 03/26/23   Lewie Chamber, MD  dexamethasone (DECADRON) 4 MG tablet Take 1 tablet by mouth twice a day for 3 days beginning on day 2 of chemo. 04/16/23   Ladene Artist, MD  dicyclomine (BENTYL) 20 MG tablet Take 1 tablet (20 mg total) by mouth 3 (three) times daily as needed for spasms (rectal/AB pain). Patient taking differently: Take 20 mg by mouth 2 (two) times daily as needed for spasms (rectal/AB pain). 02/17/23   Ladene Artist, MD  HYDROmorphone (DILAUDID) 4 MG tablet Take 1 tablet (4 mg total) by mouth every 4 (four) hours as needed for severe pain (pain score 7-10) or moderate pain (pain score 4-6). 04/16/23   Ladene Artist, MD  lactulose (CHRONULAC) 10 GM/15ML solution Take 15-30 mLs (10-20 g total) by mouth daily as needed for mild constipation. 04/02/23   Rana Snare, NP  linaclotide [facility]) 72 MCG capsule Take 72 mcg by mouth daily as needed (constipation). Patient not taking: Reported on 03/17/2023    [provider]  metoprolol tartrate (LOPRESSOR) 50 MG tablet Take 1 tablet (50 mg total) by mouth 2 (two) times daily. 11/24/22   Rodolph Bong, MD  pantoprazole (PROTONIX) 40 MG tablet Take 1 tablet (40 mg total) by mouth daily before breakfast. 02/27/23   Meredeth Ide, MD  polyethylene glycol (MIRALAX / GLYCOLAX) 17 g packet Take 17 g by mouth daily as needed. Patient taking differently: Take 17 g by mouth daily  as needed for moderate constipation or mild constipation. 02/27/23   Meredeth Ide, MD  potassium chloride (MICRO-K) 10 MEQ CR capsule Take 2 capsules (20 mEq total) by mouth 2 (two) times daily. Patient taking differently: Take 30 mEq by mouth 2 (two) times daily. 11/24/22   Rodolph Bong, MD  prochlorperazine (COMPAZINE) 10 MG tablet Take 1 tablet (10 mg total) by mouth every 6 (six) hours as needed for nausea or vomiting. 02/03/23   Rana Snare, NP  Sodium Chloride Flush (NORMAL SALINE FLUSH) 0.9 % SOLN Use 5 mLs by  Intracatheter route daily in biliary drain at 6 (six) AM as directed 03/14/23   [place], Alvira Philips, DO     Family History  Problem Relation Age of Onset   Hypertension Mother    Diabetes Mellitus II Father    Stroke Father    Ulcers Sister    Colon cancer Neg Hx    Esophageal cancer Neg Hx    Inflammatory bowel disease Neg Hx    Liver disease Neg Hx    Pancreatic cancer Neg Hx    Rectal cancer Neg Hx    Stomach cancer Neg Hx     Social History   Socioeconomic History   Marital status: Single    Spouse name: Not on file   Number of children: Not on file   Years of education: Not on file   Highest education level: Not on file  Occupational History   Not on file  Tobacco Use   Smoking status: Former    Types: Cigarettes   Smokeless tobacco: Never   Tobacco comments:    Quit 1 month ago   Vaping Use   Vaping status: Never Used  Substance and Sexual Activity   Alcohol use: Not Currently   Drug use: Not Currently    Types: Marijuana   Sexual activity: Not Currently  Other Topics Concern   Not on file  Social History Narrative   Not on file   Social Drivers of Health   Financial Resource Strain: Low Risk  (11/11/2022)   Overall Financial Resource Strain (CARDIA)    Difficulty of Paying Living Expenses: Not hard at all  Food Insecurity: No Food Insecurity (03/27/2023)   Hunger Vital Sign    Worried About Running Out of Food in the Last Year: Never true    Ran Out of Food in the Last Year: Never true  Recent Concern: Food Insecurity - Food Insecurity Present (02/05/2023)   Hunger Vital Sign    Worried About Running Out of Food in the Last Year: Sometimes true    Ran Out of Food in the Last Year: Sometimes true  Transportation Needs: No Transportation Needs (03/27/2023)   PRAPARE - Administrator, Civil Service (Medical): No    Lack of Transportation (Non-Medical): No  Physical Activity: Inactive (11/11/2022)   Exercise Vital Sign    Days of Exercise per  Week: 0 days    Minutes of Exercise per Session: 0 min  Stress: No Stress Concern Present (11/11/2022)   [employer] of Occupational Health - Occupational Stress Questionnaire    Feeling of Stress : Not at all  Social Connections: Socially Isolated (01/24/2023)   Social Connection and Isolation Panel [NHANES]    Frequency of Communication with Friends and Family: More than three times a week    Frequency of Social Gatherings with Friends and Family: More than three times a week    Attends Religious Services: Never  Active Member of Clubs or Organizations: No    Attends Banker Meetings: Never    Marital Status: Never married     Vital Signs: LMP 01/07/2006   @PHYSEXAM @  Imaging: No results found.  Labs:  CBC: Recent Labs    03/25/23 0656 03/31/23 1100 04/02/23 1024 04/16/23 1016  WBC 9.8 9.5 7.3 8.5  HGB 10.2* 12.1 10.9* 10.8*  HCT 32.8* 36.6 33.4* 32.8*  PLT 438* 427* 364 336    COAGS: Recent Labs    12/24/22 0050 03/09/23 0944  INR 1.0 1.1  APTT 31 33    BMP: Recent Labs    03/31/23 1100 04/02/23 1024 04/09/23 1152 04/16/23 1016  NA 133* 135 136 136  K 3.0* 2.7* 2.9* 3.3*  CL 87* 90* 98 96*  CO2 36* 35* 30 31  GLUCOSE 118* 120* 120* 82  BUN 11 5* 8 6  CALCIUM 10.1 9.3 9.0 9.6  CREATININE 0.46 0.42* 0.58 0.39*  GFRNONAA >60 >60 >60 >60    LIVER FUNCTION TESTS: Recent Labs    03/25/23 0656 03/31/23 1100 04/02/23 1024 04/16/23 1016  BILITOT 0.5 0.8 0.5 0.5  AST 69* 87* 110* 89*  ALT 44 51* 63* 66*  ALKPHOS 667* 847* 878* 859*  PROT 7.4 8.9* 7.7 7.8  ALBUMIN 3.2* 4.4 3.9 4.1    Assessment:  Percutaneous cholecystostomy drain placed in IR 03/11/23 Doing well   Signed: Robet Leu, PA-C 04/20/2023, 12:18 PM   Please refer to Dr. Elby Showers attestation of this note for management and plan.

## 2023-04-20 NOTE — Progress Notes (Signed)
 Referring Physician(s): Simaan,Elizabeth S Dr Meridee Score  Chief Complaint: The patient is seen in follow up today s/p Successful ultrasound and fluoroscopic guided placement of a 10 Fr cholecystostomy tube placed in IR 03/11/23  History of present illness:  Hx Colon Ca; peritoneal carcinomatosis- metastatic adenocarcinoma; malignant biliary stricture-- stent placed with Dr Meridee Score in OR 03/09/23 N/V; abd pain and fever  [place] 2/25:  IMPRESSION: No biliary dilatation but the common duct is partially obscured by the known biliary stents. Dilated gallbladder with sludge and questionable stone. There is also significant wall thickening. Please correlate for any clinical evidence of acute cholecystitis  Was deemed a candidate for percutaneous cholecystotstomy drain Placed in IR 03/11/23  Follows with Dr Myrle Sheng and Dr Meridee Score Last visit with Dr Truett Perna just 04/16/23  Here today for cholecystostomy drain evaluation Pt states she flushes daily with 5 cc sterile saline Flush always comes out around tubing at skin site Minimal pain to flush-- but some for sure This has been true since placement OP is minimal- and this also has been true since placement Denies N/V; fever   Past Medical History:  Diagnosis Date   Ascending aortic aneurysm (HCC) 12/16/2022   Benign essential hypertension 10/06/2017   Last Assessment & Plan:  Formatting of this note might be different from the original. Patient's blood pressure noted to be elevated at today's visit 188/98.  Patient will be provided with antihypertensives in the treatment center if needed in order to meet her Avastin parameters.  Patient recommended to follow up with her primary care physician regarding medication management.  Patient is not cur   Cancer [facility])    Change in bowel habits 06/28/2022   Colon cancer (HCC)    History of colon cancer 06/28/2022   Hypertension    Prolonged QT interval 01/07/2018   Renal calculi     bilateral   Sepsis due to undetermined organism (HCC) 12/24/2022   Sialoadenitis of submandibular gland 12/16/2022    Past Surgical History:  Procedure Laterality Date   BALLOON DILATION N/A 02/26/2023   Procedure: BALLOON DILATION;  Surgeon: Lemar Lofty., MD;  Location: [facility] ENDOSCOPY;  Service: Gastroenterology;  Laterality: N/A;   BILIARY BRUSHING  03/24/2022   Procedure: BILIARY BRUSHING;  Surgeon: Meridee Score Netty Starring., MD;  Location: [facility] ENDOSCOPY;  Service: Gastroenterology;;   BILIARY DILATION  03/24/2022   Procedure: BILIARY DILATION;  Surgeon: Lemar Lofty., MD;  Location: [facility] ENDOSCOPY;  Service: Gastroenterology;;   BILIARY STENT PLACEMENT  03/24/2022   Procedure: BILIARY STENT PLACEMENT;  Surgeon: Lemar Lofty., MD;  Location: [facility] ENDOSCOPY;  Service: Gastroenterology;;   BILIARY STENT PLACEMENT N/A 02/26/2023   Procedure: BILIARY STENT PLACEMENT;  Surgeon: Lemar Lofty., MD;  Location: [facility] ENDOSCOPY;  Service: Gastroenterology;  Laterality: N/A;   BIOPSY  03/24/2022   Procedure: BIOPSY;  Surgeon: Meridee Score Netty Starring., MD;  Location: [facility] ENDOSCOPY;  Service: Gastroenterology;;   BIOPSY  11/23/2022   Procedure: BIOPSY;  Surgeon: Hilarie Fredrickson, MD;  Location: [facility] ENDOSCOPY;  Service: Gastroenterology;;   BIOPSY  02/26/2023   Procedure: BIOPSY;  Surgeon: Lemar Lofty., MD;  Location: [facility] ENDOSCOPY;  Service: Gastroenterology;;   CESAREAN SECTION     x2   COLONOSCOPY N/A 11/23/2022   Procedure: COLONOSCOPY;  Surgeon: Hilarie Fredrickson, MD;  Location: [facility] ENDOSCOPY;  Service: Gastroenterology;  Laterality: N/A;   CYSTOSCOPY WITH RETROGRADE PYELOGRAM, URETEROSCOPY AND STENT PLACEMENT Right 10/22/2020   Procedure: CYSTOSCOPY WITH RETROGRADE PYELOGRAM, URETEROSCOPY AND STENT PLACEMENT;  Surgeon: Noel Christmas, MD;  Location: [facility];  Service: Urology;  Laterality: Right;   ENDOSCOPIC RETROGRADE CHOLANGIOPANCREATOGRAPHY (ERCP) WITH PROPOFOL N/A  02/26/2023   Procedure: ENDOSCOPIC RETROGRADE CHOLANGIOPANCREATOGRAPHY (ERCP) WITH PROPOFOL;  Surgeon: Meridee Score Netty Starring., MD;  Location: [facility] ENDOSCOPY;  Service: Gastroenterology;  Laterality: N/A;   ERCP N/A 03/24/2022   Procedure: ENDOSCOPIC RETROGRADE CHOLANGIOPANCREATOGRAPHY (ERCP);  Surgeon: Lemar Lofty., MD;  Location: [facility] ENDOSCOPY;  Service: Gastroenterology;  Laterality: N/A;   ESOPHAGOGASTRODUODENOSCOPY (EGD) WITH PROPOFOL N/A 02/26/2023   Procedure: ESOPHAGOGASTRODUODENOSCOPY (EGD) WITH PROPOFOL;  Surgeon: Meridee Score Netty Starring., MD;  Location: [facility] ENDOSCOPY;  Service: Gastroenterology;  Laterality: N/A;  patient needs EGD in addition to ERCP   IR PATIENT EVAL TECH 0-60 MINS  12/15/2022   IR PERC CHOLECYSTOSTOMY  03/11/2023   IR RADIOLOGIST EVAL & MGMT  09/16/2019   IR RADIOLOGIST EVAL & MGMT  09/23/2019   IR RADIOLOGIST EVAL & MGMT  09/20/2019   IR RADIOLOGIST EVAL & MGMT  09/30/2019   IR REMOVAL TUN ACCESS W/ PORT W/O FL MOD SED  09/14/2019   IR VENO/EXT/UNI RIGHT  04/11/2022   PANCREATIC STENT PLACEMENT  03/24/2022   Procedure: PANCREATIC STENT PLACEMENT;  Surgeon: Lemar Lofty., MD;  Location: [facility] ENDOSCOPY;  Service: Gastroenterology;;   REMOVAL OF STONES  03/24/2022   Procedure: REMOVAL OF STONES;  Surgeon: Lemar Lofty., MD;  Location: [facility] ENDOSCOPY;  Service: Gastroenterology;;   REMOVAL OF STONES  02/26/2023   Procedure: REMOVAL OF Sludge;  Surgeon: Lemar Lofty., MD;  Location: [facility] ENDOSCOPY;  Service: Gastroenterology;;   Dennison Mascot  03/24/2022   Procedure: Dennison Mascot;  Surgeon: Lemar Lofty., MD;  Location: [facility] ENDOSCOPY;  Service: Gastroenterology;;   Francine Graven REMOVAL  02/26/2023   Procedure: STENT REMOVAL;  Surgeon: Lemar Lofty., MD;  Location: [facility] ENDOSCOPY;  Service: Gastroenterology;;   URETERAL STENT PLACEMENT      Allergies: Lisinopril, Irinotecan, Leucovorin calcium, and Oxaliplatin  Medications: Prior to  Admission medications   Medication Sig Start Date End Date Taking? Authorizing Provider  amLODipine (NORVASC) 10 MG tablet Take 1 tablet (10 mg total) by mouth daily. 11/24/22   Rodolph Bong, MD  cloNIDine (CATAPRES) 0.1 MG tablet Take 1 tablet (0.1 mg total) by mouth 3 (three) times daily. 03/26/23   Lewie Chamber, MD  dexamethasone (DECADRON) 4 MG tablet Take 1 tablet by mouth twice a day for 3 days beginning on day 2 of chemo. 04/16/23   Ladene Artist, MD  dicyclomine (BENTYL) 20 MG tablet Take 1 tablet (20 mg total) by mouth 3 (three) times daily as needed for spasms (rectal/AB pain). Patient taking differently: Take 20 mg by mouth 2 (two) times daily as needed for spasms (rectal/AB pain). 02/17/23   Ladene Artist, MD  HYDROmorphone (DILAUDID) 4 MG tablet Take 1 tablet (4 mg total) by mouth every 4 (four) hours as needed for severe pain (pain score 7-10) or moderate pain (pain score 4-6). 04/16/23   Ladene Artist, MD  lactulose (CHRONULAC) 10 GM/15ML solution Take 15-30 mLs (10-20 g total) by mouth daily as needed for mild constipation. 04/02/23   Rana Snare, NP  linaclotide [facility]) 72 MCG capsule Take 72 mcg by mouth daily as needed (constipation). Patient not taking: Reported on 03/17/2023    [provider]  metoprolol tartrate (LOPRESSOR) 50 MG tablet Take 1 tablet (50 mg total) by mouth 2 (two) times daily. 11/24/22   Rodolph Bong, MD  pantoprazole (PROTONIX) 40 MG  tablet Take 1 tablet (40 mg total) by mouth daily before breakfast. 02/27/23   Meredeth Ide, MD  polyethylene glycol (MIRALAX / GLYCOLAX) 17 g packet Take 17 g by mouth daily as needed. Patient taking differently: Take 17 g by mouth daily as needed for moderate constipation or mild constipation. 02/27/23   Meredeth Ide, MD  potassium chloride (MICRO-K) 10 MEQ CR capsule Take 2 capsules (20 mEq total) by mouth 2 (two) times daily. Patient taking differently: Take 30 mEq by mouth 2 (two) times daily.  11/24/22   Rodolph Bong, MD  prochlorperazine (COMPAZINE) 10 MG tablet Take 1 tablet (10 mg total) by mouth every 6 (six) hours as needed for nausea or vomiting. 02/03/23   Rana Snare, NP  Sodium Chloride Flush (NORMAL SALINE FLUSH) 0.9 % SOLN Use 5 mLs by Intracatheter route daily in biliary drain at 6 (six) AM as directed 03/14/23   [place], Alvira Philips, DO     Family History  Problem Relation Age of Onset   Hypertension Mother    Diabetes Mellitus II Father    Stroke Father    Ulcers Sister    Colon cancer Neg Hx    Esophageal cancer Neg Hx    Inflammatory bowel disease Neg Hx    Liver disease Neg Hx    Pancreatic cancer Neg Hx    Rectal cancer Neg Hx    Stomach cancer Neg Hx     Social History   Socioeconomic History   Marital status: Single    Spouse name: Not on file   Number of children: Not on file   Years of education: Not on file   Highest education level: Not on file  Occupational History   Not on file  Tobacco Use   Smoking status: Former    Types: Cigarettes   Smokeless tobacco: Never   Tobacco comments:    Quit 1 month ago   Vaping Use   Vaping status: Never Used  Substance and Sexual Activity   Alcohol use: Not Currently   Drug use: Not Currently    Types: Marijuana   Sexual activity: Not Currently  Other Topics Concern   Not on file  Social History Narrative   Not on file   Social Drivers of Health   Financial Resource Strain: Low Risk  (11/11/2022)   Overall Financial Resource Strain (CARDIA)    Difficulty of Paying Living Expenses: Not hard at all  Food Insecurity: No Food Insecurity (03/27/2023)   Hunger Vital Sign    Worried About Running Out of Food in the Last Year: Never true    Ran Out of Food in the Last Year: Never true  Recent Concern: Food Insecurity - Food Insecurity Present (02/05/2023)   Hunger Vital Sign    Worried About Running Out of Food in the Last Year: Sometimes true    Ran Out of Food in the Last Year: Sometimes true   Transportation Needs: No Transportation Needs (03/27/2023)   PRAPARE - Administrator, Civil Service (Medical): No    Lack of Transportation (Non-Medical): No  Physical Activity: Inactive (11/11/2022)   Exercise Vital Sign    Days of Exercise per Week: 0 days    Minutes of Exercise per Session: 0 min  Stress: No Stress Concern Present (11/11/2022)   [employer] of Occupational Health - Occupational Stress Questionnaire    Feeling of Stress : Not at all  Social Connections: Socially Isolated (01/24/2023)   Social  Connection and Isolation Panel [NHANES]    Frequency of Communication with Friends and Family: More than three times a week    Frequency of Social Gatherings with Friends and Family: More than three times a week    Attends Religious Services: Never    Database administrator or Organizations: No    Attends Banker Meetings: Never    Marital Status: Never married     Vital Signs: LMP 01/07/2006   Physical Exam Skin:    General: Skin is dry.     Comments: Skin site is clean and dry NT no bleeding No sign of infection  Injection today reveals no flow beyond end of drain Contrast coming out at skin site        Imaging: No results found.  Labs:  CBC: Recent Labs    03/25/23 0656 03/31/23 1100 04/02/23 1024 04/16/23 1016  WBC 9.8 9.5 7.3 8.5  HGB 10.2* 12.1 10.9* 10.8*  HCT 32.8* 36.6 33.4* 32.8*  PLT 438* 427* 364 336    COAGS: Recent Labs    12/24/22 0050 03/09/23 0944  INR 1.0 1.1  APTT 31 33    BMP: Recent Labs    03/31/23 1100 04/02/23 1024 04/09/23 1152 04/16/23 1016  NA 133* 135 136 136  K 3.0* 2.7* 2.9* 3.3*  CL 87* 90* 98 96*  CO2 36* 35* 30 31  GLUCOSE 118* 120* 120* 82  BUN 11 5* 8 6  CALCIUM 10.1 9.3 9.0 9.6  CREATININE 0.46 0.42* 0.58 0.39*  GFRNONAA >60 >60 >60 >60    LIVER FUNCTION TESTS: Recent Labs    03/25/23 0656 03/31/23 1100 04/02/23 1024 04/16/23 1016  BILITOT 0.5 0.8 0.5  0.5  AST 69* 87* 110* 89*  ALT 44 51* 63* 66*  ALKPHOS 667* 847* 878* 859*  PROT 7.4 8.9* 7.7 7.8  ALBUMIN 3.2* 4.4 3.9 4.1    Assessment:  Percutaneous cholecystostomy drain evaluation Placed in IR 03/11/23 Reviewed imaging of contrast injection with Dr Melonie [place] may be out of position per imaging He wants pt to undergo CT Abd/Pelvis for verification This will be scheduled for Friday this week--- Dr Elby Showers is MD here at Clinic that day Likely removal on that day expected. Pt is aware of plan and is agreeable   Signed: Robet Leu, PA-C 04/20/2023, 12:55 PM   Please refer to Dr. Elby Showers attestation of this note for management and plan.

## 2023-04-21 ENCOUNTER — Other Ambulatory Visit: Payer: 59

## 2023-04-21 ENCOUNTER — Ambulatory Visit: Payer: 59 | Admitting: Oncology

## 2023-04-21 ENCOUNTER — Ambulatory Visit: Payer: 59

## 2023-04-22 ENCOUNTER — Telehealth: Payer: Self-pay

## 2023-04-22 ENCOUNTER — Inpatient Hospital Stay: Admitting: Dietician

## 2023-04-22 NOTE — Progress Notes (Signed)
 Nutrition Follow-up:  Reached out to patient at Home/Mobile telephone for remote consult.  She reports not feeling well today but didn't want me to reschedule or call back later.    Patient recently hospitalized for abdominal pain and acute cholecystitis and underwent PERC drain placement with radiology.  Patient consulted to explore getting support through [facility] for Ensure products through medicaid.  She is having some trouble with pain with eating meats (beef, chicken, pork) tolerating other protein sources like fish, eggs, beans. Tolerated bans and seeds, is lactose intolerant and doesn't eat or drink much dairy or dairy substitutes.  She is able to eat little bits of cheese, once a week with eggs, little dairy-(yogurts once a week), loves any kind of beans, likes pumpkin and sunflower seeds, likes mostly berries, cherries, melons, any vegetables loves all leafy greens salad asparagus, corn.  Fluids: Drinks alkaline water mostly 4 bottles, sweats a lot feels like she must drink, juices (grape, apple cranberry) 1-2 ups, Some Koolaid, Ensure Vanilla QD when she has it, gave up coffee will drink some hot tea  Endorses some constipation. Has food in home and no current trouble with food insecurity.   Medications: no vitamins or supplements, uses Miralax.for constipation  Labs: K+3.3, Alk phos  Anthropometrics:  weight increasing since discharge  Height: 64" Weight:  04/16/23  131.4# 04/02/23  130# 03/31/23 125.4# DBW: 135-145 BMI: 22.55   Estimated Energy Needs  Kcals: 2000 -2100 Protein: 72 - 90 Fluid: 2L  NUTRITION DIAGNOSIS: Food and Nutrition Related Knowledge Deficit continues.    INTERVENTION:   Encouraged lean protein sources and to monitor response to lower fat choices and preparation on abdominal pain. Relayed good potassium sources in foods, encouraged consideration of Ca+ & Vit D fortified orange juice.  Offered lactose free shake recipes, patient declined. Suggested some  budget friendly protein sources, and ONS alternatives. She prefers to use Ensure. Encouraged her to ask for coupons at cancer center. Relayed that her ONS is not medically necessary at this time as she has no pain with textures or swallowing and is able to tolerate variety protein sources and has gained weight since discharge so I didn't couldn't document the necessity for Medicaid consideration at this time. Emailed plant based protein list and high potassium foods.  MONITORING, EVALUATION, GOAL:   Goal healthy weight gain to DBW 2-4# per month, K+ WNL   NEXT VISIT:  remote follow up next week Gennaro Africa, RDN, LDN Registered Dietitian, [facility] Part Time Remote (Usual office hours: Tuesday-Thursday) Mobile: [phone] Remote Office: [phone]

## 2023-04-22 NOTE — Telephone Encounter (Signed)
 Telephone call to patient informing her that her PICC Line care appointment for tomorrow Thursday 04/23/2023 was moved from 1000 to 1300. Patient was agreeable.

## 2023-04-23 ENCOUNTER — Inpatient Hospital Stay

## 2023-04-23 VITALS — BP 158/96 | HR 88 | Temp 98.8°F | Resp 18

## 2023-04-23 DIAGNOSIS — Z5111 Encounter for antineoplastic chemotherapy: Secondary | ICD-10-CM | POA: Diagnosis not present

## 2023-04-23 DIAGNOSIS — C19 Malignant neoplasm of rectosigmoid junction: Secondary | ICD-10-CM

## 2023-04-23 DIAGNOSIS — Z95828 Presence of other vascular implants and grafts: Secondary | ICD-10-CM

## 2023-04-23 MED ORDER — SODIUM CHLORIDE 0.9% FLUSH
10.0000 mL | Freq: Once | INTRAVENOUS | Status: AC
  Administered 2023-04-23: 10 mL

## 2023-04-23 MED ORDER — HEPARIN SOD (PORK) LOCK FLUSH 100 UNIT/ML IV SOLN
500.0000 [IU] | Freq: Once | INTRAVENOUS | Status: AC
  Administered 2023-04-23: 500 [IU]

## 2023-04-24 ENCOUNTER — Ambulatory Visit
Admission: RE | Admit: 2023-04-24 | Discharge: 2023-04-24 | Disposition: A | Discharge status: Home / Self Care | Source: Ambulatory Visit | Attending: General Surgery | Admitting: General Surgery

## 2023-04-24 ENCOUNTER — Other Ambulatory Visit

## 2023-04-24 ENCOUNTER — Other Ambulatory Visit (HOSPITAL_COMMUNITY)

## 2023-04-24 DIAGNOSIS — K810 Acute cholecystitis: Secondary | ICD-10-CM

## 2023-04-24 MED ORDER — IOPAMIDOL (ISOVUE-300) INJECTION 61%
100.0000 mL | Freq: Once | INTRAVENOUS | Status: AC | PRN
  Administered 2023-04-24: 100 mL via INTRAVENOUS

## 2023-04-24 NOTE — Progress Notes (Signed)
 Referring Physician(s): Simaan,Elizabeth S  Chief Complaint: The patient is seen in follow up today s/p cholecystostomy tube placement 03/11/23 for acute cholecystitis  History of present illness: April [name] 48 year old female with history of metastatic adenocarcinoma with peritoneal carcinomatosis and malignant biliary stricture s/p stent placement by Dr. Meryl Crutch in the OR 03/09/23. She develop acute cholecystitis related to partial common duct obstruction by the known biliary stent and underwent percutaneous cholecystostomy tube placement 03/11/23 by Dr. Milford Cage. She was recently seen in follow-up 04/20/23 at which time drain injection showed occluded vs. Malpositioned catheter.  Patient was rescheduled for CT imaging with possible manipulation vs. Removal.   April [name] presents today in her usual state of health.  She has been eating and drinking per her usual.  She has no new complaints related to the drain.  Reports no leaking from the drain site.  No output from the drain since her appt on 04/20/23.  No new pain or discomfort. She is hopeful for drain removal if possible.   Past Medical History:  Diagnosis Date   Ascending aortic aneurysm (HCC) 12/16/2022   Benign essential hypertension 10/06/2017   Last Assessment & Plan:  Formatting of this note might be different from the original. Patient's blood pressure noted to be elevated at today's visit 188/98.  Patient will be provided with antihypertensives in the treatment center if needed in order to meet her Avastin parameters.  Patient recommended to follow up with her primary care physician regarding medication management.  Patient is not cur   Cancer [facility])    Change in bowel habits 06/28/2022   Colon cancer (HCC)    History of colon cancer 06/28/2022   Hypertension    Prolonged QT interval 01/07/2018   Renal calculi    bilateral   Sepsis due to undetermined organism (HCC) 12/24/2022   Sialoadenitis of submandibular gland  12/16/2022    Past Surgical History:  Procedure Laterality Date   BALLOON DILATION N/A 02/26/2023   Procedure: BALLOON DILATION;  Surgeon: Lemar Lofty., MD;  Location: [facility] ENDOSCOPY;  Service: Gastroenterology;  Laterality: N/A;   BILIARY BRUSHING  03/24/2022   Procedure: BILIARY BRUSHING;  Surgeon: Meridee Score Netty Starring., MD;  Location: [facility] ENDOSCOPY;  Service: Gastroenterology;;   BILIARY DILATION  03/24/2022   Procedure: BILIARY DILATION;  Surgeon: Lemar Lofty., MD;  Location: [facility] ENDOSCOPY;  Service: Gastroenterology;;   BILIARY STENT PLACEMENT  03/24/2022   Procedure: BILIARY STENT PLACEMENT;  Surgeon: Lemar Lofty., MD;  Location: [facility] ENDOSCOPY;  Service: Gastroenterology;;   BILIARY STENT PLACEMENT N/A 02/26/2023   Procedure: BILIARY STENT PLACEMENT;  Surgeon: Lemar Lofty., MD;  Location: [facility] ENDOSCOPY;  Service: Gastroenterology;  Laterality: N/A;   BIOPSY  03/24/2022   Procedure: BIOPSY;  Surgeon: Meridee Score Netty Starring., MD;  Location: [facility] ENDOSCOPY;  Service: Gastroenterology;;   BIOPSY  11/23/2022   Procedure: BIOPSY;  Surgeon: Hilarie Fredrickson, MD;  Location: [facility] ENDOSCOPY;  Service: Gastroenterology;;   BIOPSY  02/26/2023   Procedure: BIOPSY;  Surgeon: Lemar Lofty., MD;  Location: [facility] ENDOSCOPY;  Service: Gastroenterology;;   CESAREAN SECTION     x2   COLONOSCOPY N/A 11/23/2022   Procedure: COLONOSCOPY;  Surgeon: Hilarie Fredrickson, MD;  Location: [facility] ENDOSCOPY;  Service: Gastroenterology;  Laterality: N/A;   CYSTOSCOPY WITH RETROGRADE PYELOGRAM, URETEROSCOPY AND STENT PLACEMENT Right 10/22/2020   Procedure: CYSTOSCOPY WITH RETROGRADE PYELOGRAM, URETEROSCOPY AND STENT PLACEMENT;  Surgeon: Noel Christmas, MD;  Location: [facility] ORS;  Service: Urology;  Laterality: Right;   ENDOSCOPIC RETROGRADE CHOLANGIOPANCREATOGRAPHY (ERCP) WITH PROPOFOL N/A 02/26/2023   Procedure: ENDOSCOPIC RETROGRADE CHOLANGIOPANCREATOGRAPHY (ERCP) WITH PROPOFOL;  Surgeon:  Meridee Score Netty Starring., MD;  Location: [facility] ENDOSCOPY;  Service: Gastroenterology;  Laterality: N/A;   ERCP N/A 03/24/2022   Procedure: ENDOSCOPIC RETROGRADE CHOLANGIOPANCREATOGRAPHY (ERCP);  Surgeon: Lemar Lofty., MD;  Location: [facility] ENDOSCOPY;  Service: Gastroenterology;  Laterality: N/A;   ESOPHAGOGASTRODUODENOSCOPY (EGD) WITH PROPOFOL N/A 02/26/2023   Procedure: ESOPHAGOGASTRODUODENOSCOPY (EGD) WITH PROPOFOL;  Surgeon: Meridee Score Netty Starring., MD;  Location: [facility] ENDOSCOPY;  Service: Gastroenterology;  Laterality: N/A;  patient needs EGD in addition to ERCP   IR PATIENT EVAL TECH 0-60 MINS  12/15/2022   IR PERC CHOLECYSTOSTOMY  03/11/2023   IR RADIOLOGIST EVAL & MGMT  09/16/2019   IR RADIOLOGIST EVAL & MGMT  09/23/2019   IR RADIOLOGIST EVAL & MGMT  09/20/2019   IR RADIOLOGIST EVAL & MGMT  09/30/2019   IR RADIOLOGIST EVAL & MGMT  04/20/2023   IR REMOVAL TUN ACCESS W/ PORT W/O FL MOD SED  09/14/2019   IR VENO/EXT/UNI RIGHT  04/11/2022   PANCREATIC STENT PLACEMENT  03/24/2022   Procedure: PANCREATIC STENT PLACEMENT;  Surgeon: Lemar Lofty., MD;  Location: [facility] ENDOSCOPY;  Service: Gastroenterology;;   REMOVAL OF STONES  03/24/2022   Procedure: REMOVAL OF STONES;  Surgeon: Lemar Lofty., MD;  Location: [facility] ENDOSCOPY;  Service: Gastroenterology;;   REMOVAL OF STONES  02/26/2023   Procedure: REMOVAL OF Sludge;  Surgeon: Lemar Lofty., MD;  Location: [facility]OSCOPY;  Service: Gastroenterology;;   Dennison Mascot  03/24/2022   Procedure: Dennison Mascot;  Surgeon: Lemar Lofty., MD;  Location: [facility] ENDOSCOPY;  Service: Gastroenterology;;   Francine Graven REMOVAL  02/26/2023   Procedure: STENT REMOVAL;  Surgeon: Lemar Lofty., MD;  Location: [facility] ENDOSCOPY;  Service: Gastroenterology;;   URETERAL STENT PLACEMENT      Allergies: Lisinopril, Irinotecan, Leucovorin calcium, and Oxaliplatin  Medications: Prior to Admission medications   Medication Sig Start Date End Date  Taking? Authorizing Provider  amLODipine (NORVASC) 10 MG tablet Take 1 tablet (10 mg total) by mouth daily. 11/24/22   Rodolph Bong, MD  cloNIDine (CATAPRES) 0.1 MG tablet Take 1 tablet (0.1 mg total) by mouth 3 (three) times daily. 03/26/23   Lewie Chamber, MD  dexamethasone (DECADRON) 4 MG tablet Take 1 tablet by mouth twice a day for 3 days beginning on day 2 of chemo. 04/16/23   Ladene Artist, MD  dicyclomine (BENTYL) 20 MG tablet Take 1 tablet (20 mg total) by mouth 3 (three) times daily as needed for spasms (rectal/AB pain). Patient taking differently: Take 20 mg by mouth 2 (two) times daily as needed for spasms (rectal/AB pain). 02/17/23   Ladene Artist, MD  HYDROmorphone (DILAUDID) 4 MG tablet Take 1 tablet (4 mg total) by mouth every 4 (four) hours as needed for severe pain (pain score 7-10) or moderate pain (pain score 4-6). 04/16/23   Ladene Artist, MD  lactulose (CHRONULAC) 10 GM/15ML solution Take 15-30 mLs (10-20 g total) by mouth daily as needed for mild constipation. 04/02/23   Rana Snare, NP  linaclotide [facility]) 72 MCG capsule Take 72 mcg by mouth daily as needed (constipation). Patient not taking: Reported on 03/17/2023    [provider]  metoprolol tartrate (LOPRESSOR) 50 MG tablet Take 1 tablet (50 mg total) by mouth 2 (two) times daily. 11/24/22   Rodolph Bong, MD  pantoprazole (PROTONIX) 40 MG tablet Take 1 tablet (  40 mg total) by mouth daily before breakfast. 02/27/23   Meredeth Ide, MD  polyethylene glycol (MIRALAX / GLYCOLAX) 17 g packet Take 17 g by mouth daily as needed. Patient taking differently: Take 17 g by mouth daily as needed for moderate constipation or mild constipation. 02/27/23   Meredeth Ide, MD  potassium chloride (MICRO-K) 10 MEQ CR capsule Take 2 capsules (20 mEq total) by mouth 2 (two) times daily. Patient taking differently: Take 30 mEq by mouth 2 (two) times daily. 11/24/22   Rodolph Bong, MD  prochlorperazine (COMPAZINE)  10 MG tablet Take 1 tablet (10 mg total) by mouth every 6 (six) hours as needed for nausea or vomiting. 02/03/23   Rana Snare, NP  Sodium Chloride Flush (NORMAL [name] FLUSH) 0.9 % SOLN Use 5 mLs by Intracatheter route daily in biliary drain at 6 (six) AM as directed 03/14/23   [place], Alvira Philips, DO     Family History  Problem Relation Age of Onset   Hypertension Mother    Diabetes Mellitus II Father    Stroke Father    Ulcers Sister    Colon cancer Neg Hx    Esophageal cancer Neg Hx    Inflammatory bowel disease Neg Hx    Liver disease Neg Hx    Pancreatic cancer Neg Hx    Rectal cancer Neg Hx    Stomach cancer Neg Hx     Social History   Socioeconomic History   Marital status: Single    Spouse name: Not on file   Number of children: Not on file   Years of education: Not on file   Highest education level: Not on file  Occupational History   Not on file  Tobacco Use   Smoking status: Former    Types: Cigarettes   Smokeless tobacco: Never   Tobacco comments:    Quit 1 month ago   Vaping Use   Vaping status: Never Used  Substance and Sexual Activity   Alcohol use: Not Currently   Drug use: Not Currently    Types: Marijuana   Sexual activity: Not Currently  Other Topics Concern   Not on file  Social History Narrative   Not on file   Social Drivers of Health   Financial Resource Strain: Low Risk  (11/11/2022)   Overall Financial Resource Strain (CARDIA)    Difficulty of Paying Living Expenses: Not hard at all  Food Insecurity: No Food Insecurity (03/27/2023)   Hunger Vital Sign    Worried About Running Out of Food in the Last Year: Never true    Ran Out of Food in the Last Year: Never true  Recent Concern: Food Insecurity - Food Insecurity Present (02/05/2023)   Hunger Vital Sign    Worried About Running Out of Food in the Last Year: Sometimes true    Ran Out of Food in the Last Year: Sometimes true  Transportation Needs: No Transportation Needs (03/27/2023)    PRAPARE - Administrator, Civil Service (Medical): No    Lack of Transportation (Non-Medical): No  Physical Activity: Inactive (11/11/2022)   Exercise Vital Sign    Days of Exercise per Week: 0 days    Minutes of Exercise per Session: 0 min  Stress: No Stress Concern Present (11/11/2022)   [employer] of Occupational Health - Occupational Stress Questionnaire    Feeling of Stress : Not at all  Social Connections: Socially Isolated (01/24/2023)   Social Connection and Isolation Panel [  NHANES]    Frequency of Communication with Friends and Family: More than three times a week    Frequency of Social Gatherings with Friends and Family: More than three times a week    Attends Religious Services: Never    Database administrator or Organizations: No    Attends Banker Meetings: Never    Marital Status: Never married     Vital Signs: LMP 01/07/2006   Physical Exam Vitals and nursing note reviewed.  Constitutional:      General: She is not in acute distress.    Appearance: Normal appearance. She is not ill-appearing.  HENT:     Mouth/Throat:     Mouth: Mucous membranes are moist.     Pharynx: Oropharynx is clear.  Cardiovascular:     Rate and Rhythm: Normal rate and regular rhythm.  Pulmonary:     Effort: Pulmonary effort is normal.     Breath sounds: Normal breath sounds.  Abdominal:     General: Abdomen is flat. There is no distension.     Palpations: Abdomen is soft.     Tenderness: There is no abdominal tenderness.     Comments: RUQ drain in place.  Insertion site clean, dry, intact. Trace amount of thin serous fluid in tubing, none in gravity bag.   Skin:    General: Skin is warm and dry.  Neurological:     General: No focal deficit present.     Mental Status: She is alert and oriented to person, place, and time. Mental status is at baseline.  Psychiatric:        Mood and Affect: Mood normal.        Behavior: Behavior normal.        Thought  Content: Thought content normal.        Judgment: Judgment normal.     Imaging: DG CHOLANGIOGRAM  EXISTING TUBE Result Date: 04/20/2023 INDICATION: History of acute cholecystitis, post ultrasound fluoroscopic guided cholecystostomy tube placement. EXAM: FLUOROSCOPIC GUIDED CHOLECYSTOSTOMY TUBE INJECTION COMPARISON:  03/11/2023, 03/24/2018 MEDICATIONS: None ANESTHESIA/SEDATION: None CONTRAST:  10 mL Omnipaque 300-administered into the gallbladder fossa. FLUOROSCOPY TIME:  4.1 mGy COMPLICATIONS: None immediate. PROCEDURE: The patient was positioned supine on the fluoroscopy table. A preprocedural spot fluoroscopic image was obtained of the right upper abdominal quadrant existing cholecystostomy tube. Multiple spot fluoroscopic radiographic images were obtained of the right upper abdominal quadrant an existing cholecystostomy tube following injection of a small amount of contrast. Images were reviewed and discussed with the patient. The cholecystostomy tube was flushed with a small amount of [name] and suspected bag drainage. A dressing was placed. The patient tolerated the procedure well without immediate postprocedural complication. FINDINGS: Preprocedural spot fluoroscopic image of the right upper abdominal quadrant demonstrates grossly unchanged positioning of the cholecystostomy tube with end coiled and locked overlying the expected location of the gallbladder fundus. There are 2 indwelling plastic common bile duct stents in similar position. Subsequent contrast injection demonstrates reflux of contrast to the skin surface and capsule of the liver without opacification of the gallbladder. IMPRESSION: Concern for malposition of indwelling cholecystostomy tube. There is no opacification of the gallbladder loop, cystic, or common bile ducts. PLAN: Return later this week for CT abdomen pelvis and possible drain removal. Marliss Coots, MD [facility] Radiology  Electronically Signed   By: Marliss Coots M.D.   On: 04/20/2023 16:39   IR Radiologist Eval & Mgmt Result Date: 04/20/2023 EXAM: ESTABLISHED PATIENT OFFICE VISIT  CHIEF COMPLAINT: See Epic note. HISTORY OF PRESENT ILLNESS: See Epic note. REVIEW OF SYSTEMS: See Epic note. PHYSICAL EXAMINATION: See Epic note. ASSESSMENT AND PLAN: See Epic note. Marliss Coots, MD [facility] Radiology Electronically Signed   By: Marliss Coots M.D.   On: 04/20/2023 16:38    Labs:  CBC: Recent Labs    03/25/23 0656 03/31/23 1100 04/02/23 1024 04/16/23 1016  WBC 9.8 9.5 7.3 8.5  HGB 10.2* 12.1 10.9* 10.8*  HCT 32.8* 36.6 33.4* 32.8*  PLT 438* 427* 364 336    COAGS: Recent Labs    12/24/22 0050 03/09/23 0944  INR 1.0 1.1  APTT 31 33    BMP: Recent Labs    03/31/23 1100 04/02/23 1024 04/09/23 1152 04/16/23 1016  NA 133* 135 136 136  K 3.0* 2.7* 2.9* 3.3*  CL 87* 90* 98 96*  CO2 36* 35* 30 31  GLUCOSE 118* 120* 120* 82  BUN 11 5* 8 6  CALCIUM 10.1 9.3 9.0 9.6  CREATININE 0.46 0.42* 0.58 0.39*  GFRNONAA >60 >60 >60 >60    LIVER FUNCTION TESTS: Recent Labs    03/25/23 0656 03/31/23 1100 04/02/23 1024 04/16/23 1016  BILITOT 0.5 0.8 0.5 0.5  AST 69* 87* 110* 89*  ALT 44 51* 63* 66*  ALKPHOS 667* 847* 878* 859*  PROT 7.4 8.9* 7.7 7.8  ALBUMIN 3.2* 4.4 3.9 4.1    Assessment: Metastatic colon cancer with obstructive jaundice s/p biliary stent placement by Dr. Meridee Score 03/09/23 and partial common duct occlusion leading to cholecystitis requiring percutaneous cholecystostomy tube placement 03/11/23 Patient doing well today at return visit for drain evaluation.  She continues to eat and drink well at home without new abdominal pain.  She is followed by GI as well as Oncology for her known adenocarcinoma.  Her drain remains without output.   CT imaging today shows her gall bladder distention has resolved.  Given improvement noted on CT  without return of symptoms or output from the drain, will proceed with drain removal.   Drain removed at bedside without complication.  Dressing placed. Site care instructions reviewed with patient.   Return precautions for ED vs. Oncology given if she develops concerning symptoms post removal.   Signed: Hoyt Koch, PA 04/24/2023, 10:55 AM   Please refer to Dr. Elby Showers attestation of this note for management and plan.

## 2023-04-26 ENCOUNTER — Other Ambulatory Visit: Payer: Self-pay | Admitting: Oncology

## 2023-04-30 ENCOUNTER — Inpatient Hospital Stay

## 2023-04-30 DIAGNOSIS — Z5111 Encounter for antineoplastic chemotherapy: Secondary | ICD-10-CM | POA: Diagnosis not present

## 2023-04-30 NOTE — Patient Instructions (Signed)
 PICC Home Care Guide A peripherally inserted central catheter (PICC) is a form of IV access that allows medicines and IV fluids to be quickly put into the blood and spread throughout the body. The PICC is a long, thin, flexible tube (catheter) that is put into a vein in a person's arm or leg. The catheter ends in a large vein just outside the heart called the superior vena cava (SVC). After the PICC is put in, a chest X-ray may be done to make sure that it is in the right place. A PICC may be placed for different reasons, such as: To give medicines and liquid nutrition. To give IV fluids and blood products. To take blood samples often. If there is trouble placing a peripheral intravenous (PIV) catheter. If cared for properly, a PICC can remain in place for many months. Having a PICC can allow you to go home from the hospital sooner and continue treatment at home. Medicines and PICC care can be managed at home by a family member, caregiver, or home health care team. What are the risks? Generally, having a PICC is safe. However, problems may occur, including: A blood clot (thrombus) forming in or at the end of the PICC. A blood clot forming in a vein (deep vein thrombosis) or traveling to the lung (pulmonary embolism). Inflammation of the vein (phlebitis) in which the PICC is placed. Infection at the insertion site or in the blood. Blood infections from central lines, like PICCs, can be serious and often require a hospital stay. PICC malposition, or PICC movement or poor placement. A break or cut in the PICC. Do not use scissors near the PICC. Nerve or tendon irritation or injury during PICC insertion. How to care for your PICC Please follow the specific guidelines provided by your health care provider. Preventing infection You and any caregivers should wash your hands often with soap and water for at least 20 seconds. Wash hands: Before touching the PICC or the infusion device. Before changing a  bandage (dressing). Do not change the dressing unless you have been taught to do so and have shown you are able to change it safely. Flush the PICC as told. Tell your health care provider right away if the PICC is hard to flush or does not flush. Do not use force to flush the PICC. Use clean and germ-free (sterile) supplies only. Keep the supplies in a dry place. Do not reuse needles, syringes, or any other supplies. Reusing supplies can lead to infection. Keep the PICC dressing dry and secure it with tape if the edges stop sticking to your skin. Check your PICC insertion site every day for signs of infection. Check for: Redness, swelling, or pain. Fluid or blood. Warmth. Pus or a bad smell. Preventing other problems Do not use a syringe that is less than 10 mL to flush the PICC. Do not have your blood pressure checked on the arm in which the PICC is placed. Do not ever pull or tug on the PICC. Keep it secured to your arm with tape or a stretch wrap when not in use. Do not take the PICC out yourself. Only a trained health care provider should remove the PICC. Keep pets and children away from your PICC. How to care for your PICC dressing Keep your PICC dressing clean and dry to prevent infection. Do not take baths, swim, or use a hot tub until your health care provider approves. Ask your health care provider if you can take  showers. You may only be allowed to take sponge baths. When you are allowed to shower: Ask your health care provider to teach you how to wrap the PICC. Cover the PICC with clear plastic wrap and tape to keep it dry while showering. Follow instructions from your health care provider about how to take care of your insertion site and dressing. Make sure you: Wash your hands with soap and water for at least 20 seconds before and after you change your dressing. If soap and water are not available, use hand sanitizer. Change your dressing only if taught to do so by your health care  provider. Your PICC dressing needs to be changed if it becomes loose or wet. Leave stitches (sutures), skin glue, or adhesive strips in place. These skin closures may need to stay in place for 2 weeks or longer. If adhesive strip edges start to loosen and curl up, you may trim the loose edges. Do not remove adhesive strips completely unless your health care provider tells you to do that. Follow these instructions at home: Disposal of supplies Throw away any syringes in a disposal container that is meant for sharp items (sharps container). You can buy a sharps container from a pharmacy, or you can make one by using an empty, hard plastic bottle with a lid. Place any used dressings or infusion bags into a plastic bag. Throw that bag in the trash. General instructions  Always carry your PICC identification card or wear a medical alert bracelet. Keep the tube clamped at all times, unless it is being used. Always carry a smooth-edge clamp with you to clamp the PICC if it breaks. Do not use scissors or sharp objects near the tube. You may bend your arm and move it freely. If your PICC is near or at the bend of your elbow, avoid activity with repeated motion at the elbow. Avoid lifting heavy objects as told by your health care provider. Keep all follow-up visits. This is important. You will need to have your PICC dressing changed at least once a week. Contact a health care provider if: You have pain in your arm, ear, face, or teeth. You have a fever or chills. You have redness, swelling, or pain around the insertion site. You have fluid or blood coming from the insertion site. Your insertion site feels warm to the touch. You have pus or a bad smell coming from the insertion site. Your skin feels hard and raised around the insertion site. Your PICC dressing has gotten wet or is coming off and you have not been taught how to change it. Get help right away if: You have problems with your PICC, such as  your PICC: Was tugged or pulled and has partially come out. Do not  push the PICC back in. Cannot be flushed, is hard to flush, or leaks around the insertion site when it is flushed. Makes a flushing sound when it is flushed. Appears to have a hole or tear. Is accidentally pulled all the way out. If this happens, cover the insertion site with a gauze dressing. Do not throw the PICC away. Your health care provider will need to check it to be sure the entire catheter came out. You feel your heart racing or skipping beats, or you have chest pain. You have shortness of breath or trouble breathing. You have swelling, redness, warmth, or pain in the arm in which the PICC is placed. You have a red streak going up your arm that  starts under the PICC dressing. These symptoms may be an emergency. Get help right away. Call 911. Do not wait to see if the symptoms will go away. Do not drive yourself to the hospital. Summary A peripherally inserted central catheter (PICC) is a long, thin, flexible tube (catheter) that is put into a vein in the arm or leg. If cared for properly, a PICC can remain in place for many months. Having a PICC can allow you to go home from the hospital sooner and continue treatment at home. The PICC is inserted using a germ-free (sterile) technique by a specially trained health care provider. Only a trained health care provider should remove it. Do not have your blood pressure checked on the arm in which your PICC is placed. Always keep your PICC identification card with you. This information is not intended to replace advice given to you by your health care provider. Make sure you discuss any questions you have with your health care provider. Document Revised: 07/18/2020 Document Reviewed: 07/18/2020 Elsevier Patient Education  2024 [employer].

## 2023-05-07 ENCOUNTER — Inpatient Hospital Stay

## 2023-05-07 ENCOUNTER — Other Ambulatory Visit: Payer: Self-pay | Admitting: Nurse Practitioner

## 2023-05-07 ENCOUNTER — Other Ambulatory Visit: Payer: Self-pay

## 2023-05-07 ENCOUNTER — Inpatient Hospital Stay (HOSPITAL_BASED_OUTPATIENT_CLINIC_OR_DEPARTMENT_OTHER): Admitting: Nurse Practitioner

## 2023-05-07 ENCOUNTER — Encounter: Payer: Self-pay | Admitting: Nurse Practitioner

## 2023-05-07 VITALS — BP 139/82 | HR 87 | Temp 98.2°F | Resp 18 | Ht 64.0 in | Wt 128.0 lb

## 2023-05-07 VITALS — BP 157/90

## 2023-05-07 DIAGNOSIS — Z5111 Encounter for antineoplastic chemotherapy: Secondary | ICD-10-CM | POA: Diagnosis not present

## 2023-05-07 DIAGNOSIS — C19 Malignant neoplasm of rectosigmoid junction: Secondary | ICD-10-CM | POA: Diagnosis not present

## 2023-05-07 DIAGNOSIS — R61 Generalized hyperhidrosis: Secondary | ICD-10-CM

## 2023-05-07 LAB — CBC WITH DIFFERENTIAL (CANCER CENTER ONLY)
Abs Immature Granulocytes: 0.01 10*3/uL (ref 0.00–0.07)
Basophils Absolute: 0.1 10*3/uL (ref 0.0–0.1)
Basophils Relative: 1 %
Eosinophils Absolute: 0.3 10*3/uL (ref 0.0–0.5)
Eosinophils Relative: 4 %
HCT: 34.5 % — ABNORMAL LOW (ref 36.0–46.0)
Hemoglobin: 11.3 g/dL — ABNORMAL LOW (ref 12.0–15.0)
Immature Granulocytes: 0 %
Lymphocytes Relative: 36 %
Lymphs Abs: 2.5 10*3/uL (ref 0.7–4.0)
MCH: 29.4 pg (ref 26.0–34.0)
MCHC: 32.8 g/dL (ref 30.0–36.0)
MCV: 89.6 fL (ref 80.0–100.0)
Monocytes Absolute: 0.5 10*3/uL (ref 0.1–1.0)
Monocytes Relative: 8 %
Neutro Abs: 3.6 10*3/uL (ref 1.7–7.7)
Neutrophils Relative %: 51 %
Platelet Count: 297 10*3/uL (ref 150–400)
RBC: 3.85 MIL/uL — ABNORMAL LOW (ref 3.87–5.11)
RDW: 15.6 % — ABNORMAL HIGH (ref 11.5–15.5)
WBC Count: 7 10*3/uL (ref 4.0–10.5)
nRBC: 0 % (ref 0.0–0.2)

## 2023-05-07 LAB — CMP (CANCER CENTER ONLY)
ALT: 58 U/L — ABNORMAL HIGH (ref 0–44)
AST: 57 U/L — ABNORMAL HIGH (ref 15–41)
Albumin: 4 g/dL (ref 3.5–5.0)
Alkaline Phosphatase: 812 U/L — ABNORMAL HIGH (ref 38–126)
Anion gap: 10 (ref 5–15)
BUN: 6 mg/dL (ref 6–20)
CO2: 31 mmol/L (ref 22–32)
Calcium: 9.7 mg/dL (ref 8.9–10.3)
Chloride: 95 mmol/L — ABNORMAL LOW (ref 98–111)
Creatinine: 0.51 mg/dL (ref 0.44–1.00)
GFR, Estimated: >60 mL/min (ref >60)
Glucose, Bld: 134 mg/dL — ABNORMAL HIGH (ref 70–99)
Potassium: 2.8 mmol/L — ABNORMAL LOW (ref 3.5–5.1)
Sodium: 136 mmol/L (ref 135–145)
Total Bilirubin: 0.4 mg/dL (ref 0.0–1.2)
Total Protein: 7.3 g/dL (ref 6.5–8.1)

## 2023-05-07 LAB — MAGNESIUM: Magnesium: 1.9 mg/dL (ref 1.7–2.4)

## 2023-05-07 LAB — CEA (ACCESS): CEA (CHCC): 20.98 ng/mL — ABNORMAL HIGH (ref 0.00–5.00)

## 2023-05-07 MED ORDER — DEXAMETHASONE SODIUM PHOSPHATE 10 MG/ML IJ SOLN
10.0000 mg | Freq: Once | INTRAMUSCULAR | Status: AC
  Administered 2023-05-07: 10 mg via INTRAVENOUS
  Filled 2023-05-07: qty 1

## 2023-05-07 MED ORDER — PALONOSETRON HCL INJECTION 0.25 MG/5ML
0.2500 mg | Freq: Once | INTRAVENOUS | Status: AC
  Administered 2023-05-07: 0.25 mg via INTRAVENOUS
  Filled 2023-05-07: qty 5

## 2023-05-07 MED ORDER — SODIUM CHLORIDE 0.9 % IV SOLN: INTRAVENOUS | Status: DC

## 2023-05-07 MED ORDER — SODIUM CHLORIDE 0.9 % IV SOLN
150.0000 mg | Freq: Once | INTRAVENOUS | Status: AC
  Administered 2023-05-07: 150 mg via INTRAVENOUS
  Filled 2023-05-07: qty 150

## 2023-05-07 MED ORDER — SODIUM CHLORIDE 0.9 % IV SOLN
125.0000 mg/m2 | Freq: Once | INTRAVENOUS | Status: AC
  Administered 2023-05-07: 200 mg via INTRAVENOUS
  Filled 2023-05-07: qty 10

## 2023-05-07 MED ORDER — SODIUM CHLORIDE 0.9% FLUSH
10.0000 mL | INTRAVENOUS | Status: DC | PRN
  Administered 2023-05-07: 10 mL

## 2023-05-07 MED ORDER — ATROPINE SULFATE 1 MG/ML IV SOLN
0.5000 mg | Freq: Once | INTRAVENOUS | Status: AC | PRN
  Administered 2023-05-07: 0.5 mg via INTRAVENOUS
  Filled 2023-05-07: qty 1

## 2023-05-07 MED ORDER — POTASSIUM CHLORIDE CRYS ER 20 MEQ PO TBCR
20.0000 meq | EXTENDED_RELEASE_TABLET | Freq: Once | ORAL | Status: AC
  Administered 2023-05-07: 20 meq via ORAL
  Filled 2023-05-07: qty 1

## 2023-05-07 MED ORDER — HEPARIN SOD (PORK) LOCK FLUSH 100 UNIT/ML IV SOLN
500.0000 [IU] | Freq: Once | INTRAVENOUS | Status: AC | PRN
Start: 2023-05-07 — End: 2023-05-07
  Administered 2023-05-07: 500 [IU]

## 2023-05-07 MED ORDER — SODIUM CHLORIDE 0.9 % IV SOLN
6.0000 mg/kg | Freq: Once | INTRAVENOUS | Status: AC
  Administered 2023-05-07: 400 mg via INTRAVENOUS
  Filled 2023-05-07: qty 20

## 2023-05-07 NOTE — Progress Notes (Signed)
 Patient seen by Diana Forster NP today  Vitals are within treatment parameters:Yes   Labs are within treatment parameters: No (Please specify and give further instructions.) K+ 2.8  Treatment plan has been signed: Yes   Per physician team, Patient is ready for treatment. Please note the following modifications:Patient will received 20 meq of potassium By mouth

## 2023-05-07 NOTE — Patient Instructions (Signed)
 [facility] - A DEPT OF [facility]   Discharge Instructions: Thank you for choosing [facility] to provide your oncology and hematology care.   If you have a lab appointment with the Cancer Center, please go directly to the Cancer Center and check in at the registration area.   Wear comfortable clothing and clothing appropriate for easy access to any Portacath or PICC line.   We strive to give you quality time with your provider. You may need to reschedule your appointment if you arrive late (15 or more minutes).  Arriving late affects you and other patients whose appointments are after yours.  Also, if you miss three or more appointments without notifying the office, you may be dismissed from the clinic at the provider's discretion.      For prescription refill requests, have your pharmacy contact our office and allow 72 hours for refills to be completed.    Today you received the following chemotherapy and/or immunotherapy agents VECTIBIX/IRINOTECAN.      To help prevent nausea and vomiting after your treatment, we encourage you to take your nausea medication as directed.  BELOW ARE SYMPTOMS THAT SHOULD BE REPORTED IMMEDIATELY: *FEVER GREATER THAN 100.4 F (38 C) OR HIGHER *CHILLS OR SWEATING *NAUSEA AND VOMITING THAT IS NOT CONTROLLED WITH YOUR NAUSEA MEDICATION *UNUSUAL SHORTNESS OF BREATH *UNUSUAL BRUISING OR BLEEDING *URINARY PROBLEMS (pain or burning when urinating, or frequent urination) *BOWEL PROBLEMS (unusual diarrhea, constipation, pain near the anus) TENDERNESS IN MOUTH AND THROAT WITH OR WITHOUT PRESENCE OF ULCERS (sore throat, sores in mouth, or a toothache) UNUSUAL RASH, SWELLING OR PAIN  UNUSUAL VAGINAL DISCHARGE OR ITCHING   Items with * indicate a potential emergency and should be followed up as soon as possible or go to the Emergency Department if any problems should occur.  Please show the CHEMOTHERAPY ALERT CARD or  IMMUNOTHERAPY ALERT CARD at check-in to the Emergency Department and triage nurse.  Should you have questions after your visit or need to cancel or reschedule your appointment, please contact [facility]  Dept: [phone]  and follow the prompts.  Office hours are 8:00 a.m. to 4:30 p.m. Monday - Friday. Please note that voicemails left after 4:00 p.m. may not be returned until the following business day.  We are closed weekends and major holidays. You have access to a nurse at all times for urgent questions. Please call the main number to the clinic Dept: [phone] and follow the prompts.   For any non-urgent questions, you may also contact your provider using MyChart. We now offer e-Visits for anyone 9 and older to request care online for non-urgent symptoms. For details visit [email or web address].   Also download the MyChart app! Go to the app store, search "MyChart", open the app, select [facility], and log in with your MyChart username and password.  Panitumumab Injection What is this medication? PANITUMUMAB (pan i TOOM ue mab) treats colorectal cancer. It works by blocking a protein that causes cancer cells to grow and multiply. This helps to slow or stop the spread of cancer cells. It is a monoclonal antibody. This medicine may be used for other purposes; ask your health care provider or pharmacist if you have questions. COMMON BRAND NAME(S): Vectibix What should I tell my care team before I take this medication? They need to know if you have any of these conditions: Eye disease Low levels  of magnesium in the blood Lung disease An unusual or allergic reaction to panitumumab, other medications, foods, dyes, or preservatives Pregnant or trying to get pregnant Breast-feeding How should I use this medication? This medication is injected into a vein. It is given by your care team in a hospital or clinic setting. Talk to your  care team about the use of this medication in children. Special care may be needed. Overdosage: If you think you have taken too much of this medicine contact a poison control center or emergency room at once. NOTE: This medicine is only for you. Do not share this medicine with others. What if I miss a dose? Keep appointments for follow-up doses. It is important not to miss your dose. Call your care team if you are unable to keep an appointment. What may interact with this medication? Bevacizumab This list may not describe all possible interactions. Give your health care provider a list of all the medicines, herbs, non-prescription drugs, or dietary supplements you use. Also tell them if you smoke, drink alcohol, or use illegal drugs. Some items may interact with your medicine. What should I watch for while using this medication? Your condition will be monitored carefully while you are receiving this medication. This medication may make you feel generally unwell. This is not uncommon as chemotherapy can affect healthy cells as well as cancer cells. Report any side effects. Continue your course of treatment even though you feel ill unless your care team tells you to stop. This medication can make you more sensitive to the sun. Keep out of the sun while receiving this medication and for 2 months after stopping therapy. If you cannot avoid being in the sun, wear protective clothing and sunscreen. Do not use sun lamps, tanning beds, or tanning booths. Check with your care team if you have severe diarrhea, nausea, and vomiting or if you sweat a lot. The loss of too much body fluid may make it dangerous for you to take this medication. This medication may cause serious skin reactions. They can happen weeks to months after starting the medication. Contact your care team right away if you notice fevers or flu-like symptoms with a rash. The rash may be red or purple and then turn into blisters or peeling of the  skin. You may also notice a red rash with swelling of the face, lips, or lymph nodes in your neck or under your arms. Talk to your care team if you may be pregnant. Serious birth defects can occur if you take this medication during pregnancy and for 2 months after the last dose. Contraception is recommended while taking this medication and for 2 months after the last dose. Your care team can help you find the option that works for you. Do not breastfeed while taking this medication and for 2 months after the last dose. This medication may cause infertility. Talk to your care team if you are concerned about your fertility. What side effects may I notice from receiving this medication? Side effects that you should report to your care team as soon as possible: Allergic reactions--skin rash, itching, hives, swelling of the face, lips, tongue, or throat Dry cough, shortness of breath or trouble breathing Eye pain, redness, irritation, or discharge with blurry or decreased vision Infusion reactions--chest pain, shortness of breath or trouble breathing, feeling faint or lightheaded Low magnesium level--muscle pain or cramps, unusual weakness or fatigue, fast or irregular heartbeat, tremors Low potassium level--muscle pain or cramps, unusual weakness or  fatigue, fast or irregular heartbeat, constipation Redness, blistering, peeling, or loosening of the skin, including inside the mouth Skin reactions on sun-exposed areas Side effects that usually do not require medical attention (report to your care team if they continue or are bothersome): Change in nail shape, thickness, or color Diarrhea Dry skin Fatigue Nausea Vomiting This list may not describe all possible side effects. Call your doctor for medical advice about side effects. You may report side effects to FDA at 1-800-FDA-1088. Where should I keep my medication? This medication is given in a hospital or clinic. It will not be stored at  home. NOTE: This sheet is a summary. It may not cover all possible information. If you have questions about this medicine, talk to your doctor, pharmacist, or health care provider.  2024 Elsevier/Gold Standard (2021-05-15 00:00:00)  Irinotecan Injection What is this medication? IRINOTECAN (ir in oh TEE kan) treats some types of cancer. It works by slowing down the growth of cancer cells. This medicine may be used for other purposes; ask your health care provider or pharmacist if you have questions. COMMON BRAND NAME(S): Camptosar What should I tell my care team before I take this medication? They need to know if you have any of these conditions: Dehydration Diarrhea Infection, especially a viral infection, such as chickenpox, cold sores, herpes Liver disease Low blood cell levels (white cells, red cells, and platelets) Low levels of electrolytes, such as calcium, magnesium, or potassium in your blood Recent or ongoing radiation An unusual or allergic reaction to irinotecan, other medications, foods, dyes, or preservatives If you or your partner are pregnant or trying to get pregnant Breast-feeding How should I use this medication? This medication is injected into a vein. It is given by your care team in a hospital or clinic setting. Talk to your care team about the use of this medication in children. Special care may be needed. Overdosage: If you think you have taken too much of this medicine contact a poison control center or emergency room at once. NOTE: This medicine is only for you. Do not share this medicine with others. What if I miss a dose? Keep appointments for follow-up doses. It is important not to miss your dose. Call your care team if you are unable to keep an appointment. What may interact with this medication? Do not take this medication with any of the following: Cobicistat Itraconazole This medication may also interact with the following: Certain antibiotics, such  as clarithromycin, rifampin, rifabutin Certain antivirals for HIV or AIDS Certain medications for fungal infections, such as ketoconazole, posaconazole, voriconazole Certain medications for seizures, such as carbamazepine, phenobarbital, phenytoin Gemfibrozil Nefazodone St. John's wort This list may not describe all possible interactions. Give your health care provider a list of all the medicines, herbs, non-prescription drugs, or dietary supplements you use. Also tell them if you smoke, drink alcohol, or use illegal drugs. Some items may interact with your medicine. What should I watch for while using this medication? Your condition will be monitored carefully while you are receiving this medication. You may need blood work while taking this medication. This medication may make you feel generally unwell. This is not uncommon as chemotherapy can affect healthy cells as well as cancer cells. Report any side effects. Continue your course of treatment even though you feel ill unless your care team tells you to stop. This medication can cause serious side effects. To reduce the risk, your care team may give you other medications to take  before receiving this one. Be sure to follow the directions from your care team. This medication may affect your coordination, reaction time, or judgement. Do not drive or operate machinery until you know how this medication affects you. Sit up or stand slowly to reduce the risk of dizzy or fainting spells. Drinking alcohol with this medication can increase the risk of these side effects. This medication may increase your risk of getting an infection. Call your care team for advice if you get a fever, chills, sore throat, or other symptoms of a cold or flu. Do not treat yourself. Try to avoid being around people who are sick. Avoid taking medications that contain aspirin, acetaminophen, ibuprofen, naproxen, or ketoprofen unless instructed by your care team. These  medications may hide a fever. This medication may increase your risk to bruise or bleed. Call your care team if you notice any unusual bleeding. Be careful brushing or flossing your teeth or using a toothpick because you may get an infection or bleed more easily. If you have any dental work done, tell your dentist you are receiving this medication. Talk to your care team if you or your partner are pregnant or think either of you might be pregnant. This medication can cause serious birth defects if taken during pregnancy and for 6 months after the last dose. You will need a negative pregnancy test before starting this medication. Contraception is recommended while taking this medication and for 6 months after the last dose. Your care team can help you find the option that works for you. Do not father a child while taking this medication and for 3 months after the last dose. Use a condom for contraception during this time period. Do not breastfeed while taking this medication and for 7 days after the last dose. This medication may cause infertility. Talk to your care team if you are concerned about your fertility. What side effects may I notice from receiving this medication? Side effects that you should report to your care team as soon as possible: Allergic reactions--skin rash, itching, hives, swelling of the face, lips, tongue, or throat Dry cough, shortness of breath or trouble breathing Increased saliva or tears, increased sweating, stomach cramping, diarrhea, small pupils, unusual weakness or fatigue, slow heartbeat Infection--fever, chills, cough, sore throat, wounds that don't heal, pain or trouble when passing urine, general feeling of discomfort or being unwell Kidney injury--decrease in the amount of urine, swelling of the ankles, hands, or feet Low red blood cell level--unusual weakness or fatigue, dizziness, headache, trouble breathing Severe or prolonged diarrhea Unusual bruising or  bleeding Side effects that usually do not require medical attention (report to your care team if they continue or are bothersome): Constipation Diarrhea Hair loss Loss of appetite Nausea Stomach pain This list may not describe all possible side effects. Call your doctor for medical advice about side effects. You may report side effects to FDA at 1-800-FDA-1088. Where should I keep my medication? This medication is given in a hospital or clinic. It will not be stored at home. NOTE: This sheet is a summary. It may not cover all possible information. If you have questions about this medicine, talk to your doctor, pharmacist, or health care provider.  2024 Elsevier/Gold Standard (2021-05-13 00:00:00)

## 2023-05-07 NOTE — Progress Notes (Signed)
 [facility] OFFICE PROGRESS NOTE   Diagnosis: Colon cancer  INTERVAL HISTORY:   April [name] returns as scheduled.  She completed another cycle of irinotecan /Panitumumab  04/16/2023.  She developed "profuse sweating" the night of treatment.  The sweating lasted until 2 days ago.  The sweating episodes have occurred following multiple treatments.  She wonders if the sweating is hormonal.  She also had nausea/vomiting.  Maybe a single mouth sore.  No diarrhea.  Last bowel movement was 4 to 5 days ago.  She plans to take a dose of lactulose  tonight.  No rash.  Gallbladder drain removed 04/24/2023.  Objective:  Vital signs in last 24 hours:  Blood pressure 139/82, pulse 87, temperature 98.2 F (36.8 C), temperature source Temporal, resp. rate 18, height 5\' 4"  (1.626 m), weight 128 lb (58.1 kg), last menstrual period 01/07/2006, SpO2 100%.    HEENT: No thrush or ulcers. Resp: Lungs clear bilaterally. Cardio: Regular rate and rhythm. GI: No hepatosplenomegaly. Vascular: No leg edema. PICC without erythema.  Lab Results:  Lab Results  Component Value Date   WBC 7.0 05/07/2023   HGB 11.3 (L) 05/07/2023   HCT 34.5 (L) 05/07/2023   MCV 89.6 05/07/2023   PLT 297 05/07/2023   NEUTROABS 3.6 05/07/2023    Imaging:  No results found.  Medications: I have reviewed the patient's current medications.  Assessment/Plan: Sigmoid colon cancer, stage IV (pT4a,pN2b,M1c) Colonoscopy 03/24/2019-3 rectal polyps-hyperplastic polyps, distal colon biopsy-at least intramucosal adenocarcinoma, completely obstructing mass in the distal sigmoid colon, could not be traversed, intact mismatch repair protein expression 03/24/2019-CEA 56.1 03/29/2019 CT abdomen/pelvis-circumferential thickening involving the entire mid and distal sigmoid colon to the level of the rectosigmoid junction, solid/cystic mass in the left ovary, bilateral nephrolithiasis Robotic assisted low anterior resection,  mesenteric lymphadenectomy, bilateral salpingo-oophorectomy 04/08/2019 Pathology ([facility] review of outside pathology) metastatic adenocarcinoma involving the peritoneum overlying the round ligament, serosa of the urinary bladder, serosa of the right ureter a sacral area, pelvic peritoneum, and left ovary.  Omental biopsy with focal mucin pools with no carcinoma cells identified, right hemidiaphragm biopsy involved by metastatic adenocarcinoma, invasive adenocarcinoma the sigmoid colon, moderately differentiated, T4a, perineural and vascular invasion present, 7/22 lymph nodes, multiple tumor deposits, resection margins negative Negative for PD-L1, low probability of MSI-high, HER-2 negative, negative for BRAF, NRAS and KRAS alterations CTs 05/19/2019-no evidence of metastatic disease, findings suspicious for colitis of the transverse and ascending colon, small amount of ascites in the cul-de-sac, bilateral renal calculi Cycle 1 FOLFIRI 05/24/2019, bevacizumab  added with cycle 2 Cycle 4 FOLFIRI/bevacizumab  07/05/2019 Cycle 5 FOLFIRI 07/27/2019, bevacizumab  held secondary to hypertension Cycle 6 FOLFIRI 08/10/1999, bevacizumab  held secondary to hypertension CTs 08/30/2019-no evidence of recurrent disease, new subsolid right lower lobe nodule felt to be inflammatory, emphysema Cycle 7 FOLFIRI 09/26/2019, bevacizumab  remains on hold secondary to hypertension Cycle 8 FOLFIRI 10/17/2019, bevacizumab  held secondary to hypertension, Udenyca  added for neutropenia Cycle 9 FOLFIRI 11/07/2019, bevacizumab  held secondary to hypertension, Udenyca  Cycle 10 FOLFIRI 11/28/2019, bevacizumab  held, Udenyca  Cycle 11 FOLFIRI 12/26/2019, bevacizumab  held, Udenyca  CTs 01/25/2020-no evidence of recurrent disease, resolution of right lower lobe nodule Maintenance Xeloda  beginning 02/06/2020 CTs 04/25/2020- no evidence of metastatic disease, multiple bilateral renal calculi without hydronephrosis Maintenance Xeloda  continued CT  abdomen/pelvis without contrast 08/10/2020-bilateral staghorn renal calculi, no evidence of metastatic disease; addendum 08/23/2020-small but increasing omental nodules. Cycle 1 FOLFIRI/panitumumab  09/19/2020 Cycle 2 FOLFIRI/Panitumumab  10/03/2020 Cycle 3 FOLFIRI/Panitumumab  10/17/2020 Cycle 4 FOLFIRI/panitumumab  10/31/2020 Cycle 5 FOLFIRI/Panitumumab  11/14/2020 CTs 11/26/2020-stable omental metastases compared to  10/22/2020, mildly decreased from 08/10/2020.  Left lower quadrant tiny paracolic gutter implant slightly decreased from CT 08/10/2020.  No new or progressive metastatic disease in the abdomen or pelvis. Cycle 6 FOLFIRI/Panitumumab  11/28/2020, irinotecan  dose reduced, treatment schedule adjusted to every 3 weeks going forward Cycle 7 FOLFIRI/panitumumab  12/24/2020 Cycle 8 FOLFIRI/Panitumumab  01/16/2021--treatment held due to hypertension in the infusion area. Cycle 8 FOLFIRI/panitumumab  01/21/2021 Cycle 9 FOLFIRI/panitumumab  02/11/2021 Cycle 10 FOLFIRI/Panitumumab  03/04/2021 Cycle 11 FOLFIRI/Panitumumab  03/25/2021 CT abdomen/pelvis 04/10/2021-stable right omental and left posterior paracolic gutter nodules Cycle 12 FOLFIRI/panitumumab  04/15/2021 Cycle 13 FOLFIRI/panitumumab  05/06/2021 Cycle 14 FOLFIRI/Panitumumab  05/27/2021 Cycle 15 FOLFIRI/Panitumumab  06/17/2021 CT abdomen/pelvis 07/05/2021-slight enlargement of a dominant right omental implant, no new implants Patient requested a treatment break CT abdomen/pelvis 10/04/2021-mild increase in size of multiple omental soft tissue nodules, 4 mm calculus in the distal left ureter adjacent to the ureteral stent Cycle 1 Lonsurf  10/14/2021 10/28/2021 Avastin  every 2 weeks Cycle 2 Lonsurf  11/11/2021 Cycle 3 Lonsurf  12/09/2021 Cycle 4 Lonsurf  01/06/2022 Avastin  held 01/20/2022 due to proteinuria, 24-hour urine 43 mg CT/pelvis 01/30/2022-possible new peritoneal implant at the transverse colon, no significant change in other omental/peritoneal implants, stable  chronic rectal wall thickening, status post removal of double-J left ureteral stent with no evidence of hydronephrosis or ureteral calculus, nonobstructing bilateral renal calculi Cycle 5 Lonsurf  02/05/2022 or 02/06/2022 Avastin  every 2 weeks Cycle 6 Lonsurf  03/03/2022 CTs 04/05/2022-increase in size and number of peritoneal implants compared to January 2024 central liver mass narrowing the anterior branch of the right portal vein, decreased biliary duct dilation, biliary stents in place, stable right cardiophrenic lymph nodes, separate pigtail stent in the lumen of the proximal duodenum Cycle 1 FOLFOX 04/14/2022 CT abdomen/pelvis 04/16/2022-stent within the third portion of the duodenum, stable peritoneal nodules, indwelling biliary stents with mild left biliary duct dilation Cycle 2 FOLFOX 04/28/2022, Emend and prophylactic dexamethasone  added Cycle 3 FOLFOX 05/12/2022, Aloxi , Emend, prophylactic dexamethasone , Compazine  and lorazepam  as needed Chemotherapy held 05/25/2020 due to severe hypertension, evaluated in the emergency department Cycle 4 FOLFOX 06/03/2022 Cycle 5 FOLFOX 06/16/2022, oxaliplatin  dose reduced and 5-FU bolus eliminated due to neutropenia CT 06/25/2022-unchanged soft tissue at the hepatic hilum, unchanged, bile duct stent, decreased size of peritoneal and omental nodules and diminished volume of likely fluid in the lower left pelvis Cycle 6 FOLFOX 06/30/2022 Cycle 7 FOLFOX 07/21/2022 CT 07/24/2022 (in the emergency department with nausea, headache, abdominal pain)-no acute pathology.  No significant interval change in omental nodularity. Cycle 8 FOLFOX 08/04/2022, pruritus on the palms at the end of the oxaliplatin  infusion, Pepcid  and Benadryl  given, symptoms resolved, infusion not resumed Cycle 9 FOLFOX 08/18/2022-Pepcid  and Benadryl  added to premedications, Oxaliplatin  diluted in a larger volume and infusion time increased Cycle 10 FOLFOX 09/08/2022 Cycle 11 FOLFOX 09/22/2022 10/06/2022 patient  declined treatment CTs 10/15/2022-similar soft tissue fullness in the hepatic hilum with biliary stents in place, decrease in omental/peritoneal metastases Maintenance capecitabine  10/26/2022 CT abdomen/pelvis 11/19/2022-mild increase in tumor studding at the left paracolic gutter and omentum, circumferential wall thickening of the rectum, biliary stents in place with minimal increase in intrahepatic biliary dilation, nonobstructive renal calculi Colonoscopy 11/23/2022-anastomosis at 15 cm from the anal verge-patent, 2 cm polypoid tissue at the anastomosis biopsied, invasive adenocarcinoma moderately differentiated, preserved expression of major MMR proteins CTs 12/15/2022: Possible developing nodule in the lateral left liver, increased omental nodularity, persistent wall thickening in the upper rectum CT abdomen/pelvis 12/24/2022: Increased thickened fold in the transverse colon-infectious/inflammatory, 2.1 cm lateral left liver lesion, potential new 5 mm segment 5  lesion, stable biliary stents with intrahepatic biliary dilation, central biliary wall thickening near the hepatic hilum, multiple peritoneal/omental implants unchanged from 12/15/2022, but likely larger than 11/19/2022 Cycle 1 irinotecan /Panitumumab  01/06/2023 Cycle 2 irinotecan /Panitumumab  01/20/2023 CTs 01/24/2023-slight increase in omental nodularity. Cycle 3 irinotecan /Panitumumab  02/03/2023 Cycle 4 irinotecan /panitumumab  02/17/2023 CTs abdomen/pelvis 02/24/2023-subtly hypoattenuating lesion within the peripheral aspect of hepatic segment 2 redemonstrated.  Additional scattered subcentimeter low-density lesions within the liver too small to characterize.  Prominent intrahepatic biliary dilatation with biliary stents in place.  Similar degree of nodularity along the omentum and left paracolic gutter.  No ascites. CTs 03/09/2023-indwelling endoscopically placed stents; level of intrahepatic biliary duct dilatation is slightly improved on the left and  some on the right.  Gallbladder is dilated with wall edema which is increasing.  More nodular tissue extending up along the porta hepatis.  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  Rectum with nodular wall thickening with extension into the adjacent tissue, nodular areas seen extending perirectal and presacral spaces.  Peritoneal carcinomatosis again identified. Irinotecan /Panitumumab  04/02/2023 Irinotecan /panitumumab  04/16/2023 Irinotecan /Panitumumab  05/07/2023   Hypertension G4 P3, twins Kidney stones-bilateral staghorn renal calculi on CT 08/10/2020 Infected Port-A-Cath 09/12/2019-placed on Augmentin , referred for Port-A-Cath removal; Port-A-Cath removed 09/14/2019; PICC line placed 09/14/2019; culture staph aureus.  Course of Septra  completed. Neutropenia secondary to chemotherapy-Udenyca  added with cycle 8 FOLFIRI Right nephrostomy tube 09/12/2020; stent placed 10/09/2020, percutaneous nephrostolithotomy treatment of right-sided kidney stones 10/09/2020, malpositioned right ureter stent replacement 10/23/2020, right ureter stent removed 10/29/2020 Percutaneous left nephrostolithotomy 10/01/2021-no renal stone identified, left ureter stent left in place Cystoscopy 10/14/2021-stent removed 8.  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  2 moderate biliary strictures found in the hepatic duct system, malignant  appearing.  Left and right hepatic duct and all intrahepatic branches were moderately dilated secondary to the strictures.  The strictures were dilated.  Plastic stent placed into the left hepatic duct and right hepatic duct. 9.  Admission 04/16/2022 with nausea/vomiting and abdominal pain, felt to be acute toxicity related to chemotherapy, discharged home 04/18/2022 10.  Admission 11/19/2022 with nausea/vomiting and abdominal pain-improved 11.  Admission 12/15/2022 with fever and right submandibular swelling/tenderness CT neck 12/15/2022: 2 mm distal right submandibular duct calculus with mild inflammatory change adjacent to the right submandibular gland 12.  Admission 12/24/2022 with a fever nausea/vomiting, diarrhea, Klebsiella bacteremia completed course of Augmentin  13.  Admission with abdominal pain and nausea/vomiting 03/09/2023 through 03/14/2023-concern for gallbladder inflammation.  Not felt to be a surgical candidate.  Percutaneous cholecystostomy tube placed 03/11/2023.  Tube removed 04/24/2023.        Disposition: April [name] appears stable.  She is on active treatment with irinotecan /Panitumumab  every 3 weeks.  Aside from diaphoresis following treatment she seems to be tolerating well.  Plan to proceed with irinotecan /Panitumumab  today as scheduled.  Etiology of the diaphoresis is unclear, question if related to irinotecan .  We are checking FSH/LH/estradiol today.  CBC and chemistry panel reviewed.  Labs adequate to proceed as above.  She has persistent hypokalemia.  She confirms  at today's visit she is taking approximately 2 doses of oral potassium a week.  She will try to take as prescribed.  We are giving her a dose of K-Dur 20 meq p.o. x 1 today.  She will return for IV fluids and a basic metabolic panel in 1 week.  She will return for follow-up and treatment in 3 weeks.  She will contact the office in the interim with any problems.    Diana Forster ANP/GNP-BC   05/07/2023  11:11  AM

## 2023-05-08 LAB — FOLLICLE STIMULATING HORMONE: FSH: 65.9 m[IU]/mL

## 2023-05-08 LAB — ESTRADIOL: Estradiol: 11.7 pg/mL

## 2023-05-08 LAB — LUTEINIZING HORMONE: LH: 28.7 m[IU]/mL

## 2023-05-14 ENCOUNTER — Inpatient Hospital Stay: Admitting: Nurse Practitioner

## 2023-05-14 ENCOUNTER — Ambulatory Visit

## 2023-05-14 ENCOUNTER — Other Ambulatory Visit (HOSPITAL_BASED_OUTPATIENT_CLINIC_OR_DEPARTMENT_OTHER): Payer: Self-pay

## 2023-05-14 ENCOUNTER — Inpatient Hospital Stay: Attending: Oncology

## 2023-05-14 ENCOUNTER — Inpatient Hospital Stay

## 2023-05-14 ENCOUNTER — Other Ambulatory Visit: Payer: Self-pay

## 2023-05-14 ENCOUNTER — Encounter: Payer: Self-pay | Admitting: Emergency Medicine

## 2023-05-14 ENCOUNTER — Ambulatory Visit: Admitting: Nurse Practitioner

## 2023-05-14 ENCOUNTER — Encounter: Payer: Self-pay | Admitting: Nurse Practitioner

## 2023-05-14 ENCOUNTER — Other Ambulatory Visit

## 2023-05-14 VITALS — BP 160/114 | HR 86 | Temp 98.7°F | Resp 18 | Ht 64.0 in | Wt 129.1 lb

## 2023-05-14 DIAGNOSIS — R109 Unspecified abdominal pain: Secondary | ICD-10-CM | POA: Diagnosis not present

## 2023-05-14 DIAGNOSIS — C19 Malignant neoplasm of rectosigmoid junction: Secondary | ICD-10-CM

## 2023-05-14 DIAGNOSIS — K6289 Other specified diseases of anus and rectum: Secondary | ICD-10-CM | POA: Insufficient documentation

## 2023-05-14 DIAGNOSIS — C187 Malignant neoplasm of sigmoid colon: Secondary | ICD-10-CM | POA: Insufficient documentation

## 2023-05-14 DIAGNOSIS — Z87442 Personal history of urinary calculi: Secondary | ICD-10-CM | POA: Insufficient documentation

## 2023-05-14 DIAGNOSIS — I10 Essential (primary) hypertension: Secondary | ICD-10-CM | POA: Insufficient documentation

## 2023-05-14 DIAGNOSIS — C786 Secondary malignant neoplasm of retroperitoneum and peritoneum: Secondary | ICD-10-CM | POA: Insufficient documentation

## 2023-05-14 DIAGNOSIS — Z95828 Presence of other vascular implants and grafts: Secondary | ICD-10-CM

## 2023-05-14 DIAGNOSIS — E876 Hypokalemia: Secondary | ICD-10-CM | POA: Insufficient documentation

## 2023-05-14 DIAGNOSIS — R112 Nausea with vomiting, unspecified: Secondary | ICD-10-CM | POA: Insufficient documentation

## 2023-05-14 DIAGNOSIS — C186 Malignant neoplasm of descending colon: Secondary | ICD-10-CM | POA: Diagnosis present

## 2023-05-14 LAB — BASIC METABOLIC PANEL - CANCER CENTER ONLY
Anion gap: 11 (ref 5–15)
BUN: 6 mg/dL (ref 6–20)
CO2: 30 mmol/L (ref 22–32)
Calcium: 9.8 mg/dL (ref 8.9–10.3)
Chloride: 96 mmol/L — ABNORMAL LOW (ref 98–111)
Creatinine: 0.38 mg/dL — ABNORMAL LOW (ref 0.44–1.00)
GFR, Estimated: >60 mL/min (ref >60)
Glucose, Bld: 85 mg/dL (ref 70–99)
Potassium: 2.9 mmol/L — ABNORMAL LOW (ref 3.5–5.1)
Sodium: 136 mmol/L (ref 135–145)

## 2023-05-14 MED ORDER — ACETAMINOPHEN 325 MG PO TABS
650.0000 mg | ORAL_TABLET | Freq: Once | ORAL | Status: AC
  Administered 2023-05-14: 650 mg via ORAL
  Filled 2023-05-14: qty 2

## 2023-05-14 MED ORDER — POTASSIUM CHLORIDE CRYS ER 20 MEQ PO TBCR
40.0000 meq | EXTENDED_RELEASE_TABLET | Freq: Once | ORAL | Status: AC
  Administered 2023-05-14: 40 meq via ORAL
  Filled 2023-05-14: qty 2

## 2023-05-14 MED ORDER — METOCLOPRAMIDE HCL 10 MG PO TABS
10.0000 mg | ORAL_TABLET | Freq: Four times a day (QID) | ORAL | 0 refills | Status: DC
  Filled 2023-05-14: qty 45, 12d supply, fill #0

## 2023-05-14 MED ORDER — HEPARIN SOD (PORK) LOCK FLUSH 100 UNIT/ML IV SOLN
500.0000 [IU] | Freq: Once | INTRAVENOUS | Status: AC
  Administered 2023-05-14: 500 [IU]

## 2023-05-14 MED ORDER — SODIUM CHLORIDE 0.9% FLUSH
10.0000 mL | Freq: Once | INTRAVENOUS | Status: AC
  Administered 2023-05-14: 10 mL

## 2023-05-14 MED ORDER — MORPHINE SULFATE (PF) 2 MG/ML IV SOLN
4.0000 mg | Freq: Once | INTRAVENOUS | Status: AC
  Administered 2023-05-14: 4 mg via INTRAVENOUS
  Filled 2023-05-14: qty 2

## 2023-05-14 MED ORDER — MORPHINE SULFATE (PF) 2 MG/ML IV SOLN
2.0000 mg | Freq: Once | INTRAVENOUS | Status: AC
  Administered 2023-05-14: 2 mg via INTRAVENOUS
  Filled 2023-05-14: qty 1

## 2023-05-14 MED ORDER — HYDROMORPHONE HCL 4 MG PO TABS
4.0000 mg | ORAL_TABLET | ORAL | 0 refills | Status: DC | PRN
  Filled 2023-05-14: qty 100, 17d supply, fill #0

## 2023-05-14 MED ORDER — SODIUM CHLORIDE 0.9 % IV SOLN
INTRAVENOUS | Status: AC
Start: 2023-05-14 — End: 2023-05-14

## 2023-05-14 MED ORDER — ONDANSETRON HCL 4 MG/2ML IJ SOLN
8.0000 mg | Freq: Once | INTRAMUSCULAR | Status: AC
Start: 2023-05-14 — End: 2023-05-14
  Administered 2023-05-14: 8 mg via INTRAVENOUS
  Filled 2023-05-14: qty 4

## 2023-05-14 NOTE — Progress Notes (Signed)
 Patient presents today for IVF. Patient c/o "feeling unwell" with fatigue, "chills and sweats", fatigue, nausea, vomiting, and abd/rectal pain. Per patient pain (5/10). Patient reports taking some Dilaudid  prior to arrival with some relief. Patient afebrile at this time 97.8. Patient's blood pressure 166/116. Per patient has not taking her prescribed blood pressure medications "in a while." Lacie Burton NP made aware of all symptoms/complaints.   Patient's potassium 2.8. Lacie Burton NP aware. Patient to get Potassium 40 mEq PO. Patient's also given order for zofran  prior to potassium being given. Patient to get 1 liter NS per Delvin File NP.    1036-Patient requesting something for pain. Per patient took the Dilaudid  at 8am and pain is starting to increase again from a 5/10 to a 7/10. Lacie Burton NP made aware.   1045-Lacie Burton at chairside talking and assessing patient.   1145-Patient given morphine  2mg  IV for pain per Dr Enedina Harrow order. Patient denying any improvement in pain. Lacie Burton NP and Dr Scherrie Curt made aware, awaiting further orders.   1205-Lacie Burton NP back at chair side to talk and reassess to patient.   1307-Patient now c/o headache. Lacie Burton NP aware.  Patient to be given tylenol . Dr Scherrie Curt and Lacie Burton NP to come and reassess patient and discuss options of plans.  1405-Patient to get Tylenol  and Morphine  via PICC and will reassess after to make final  decision on plans per Dr Scherrie Curt.  1445- Patient reports improvement in pain of both headache and abd pain, now 4/10. PICC Line dressing changed. Patient VS stable with exception of blood pressure 160/114 which is what she was upon arrival. Patient reports hasn't had blood pressure medication in 4 days but has refills to call in. Diana Forster NP at chairside talking to patient.    Patient tolerated treatment  Patient discharged per Diana Forster NP and Dr Enedina Harrow approval. Patient left ambulatory in stable  condition. Follow up as scheduled.

## 2023-05-14 NOTE — Progress Notes (Addendum)
 [facility]     [phone]) [phone] [phone]    Patient Care Team: Early, Adriane Albe, NP as PCP - General (Nurse Practitioner) Sumner Ends, MD as Consulting Physician (Oncology)   CHIEF COMPLAINT: Symptom management: N/V, pain, hypokalemia  CURRENT THERAPY: S/p C3 Irinotecan /Panitumumab   INTERVAL HISTORY April [name] is seen in infusion during scheduled IVF. She reports nausea at home and vomiting 2x in past 24 hours. She has tried compazine . Bowels moving but has to strain. Gets abdominal cramping when she tries meds. Therefore she is not getting enough potassium. Rx for 3 tabs BID but only takes 1 tab BID if that. Also has rectal pain. Dilaudid  helps, she took a dose at 8 am this morning. She continues to have hot/cold flashes. Urine was tea colored recently and "more of it." She has coughed a few times.   ROS  All other systems reviewed and negative  Past Medical History:  Diagnosis Date   Ascending aortic aneurysm (HCC) 12/16/2022   Benign essential hypertension 10/06/2017   Last Assessment & Plan:  Formatting of this note might be different from the original. Patient's blood pressure noted to be elevated at today's visit 188/98.  Patient will be provided with antihypertensives in the treatment center if needed in order to meet her Avastin  parameters.  Patient recommended to follow up with her primary care physician regarding medication management.  Patient is not cur   Cancer [facility])    Change in bowel habits 06/28/2022   Colon cancer (HCC)    History of colon cancer 06/28/2022   Hypertension    Prolonged QT interval 01/07/2018   Renal calculi    bilateral   Sepsis due to undetermined organism (HCC) 12/24/2022   Sialoadenitis of submandibular gland 12/16/2022     Past Surgical History:  Procedure Laterality Date   BALLOON DILATION N/A 02/26/2023   Procedure: BALLOON DILATION;  Surgeon: Normie Becton., MD;  Location: [facility] ENDOSCOPY;   Service: Gastroenterology;  Laterality: N/A;   BILIARY BRUSHING  03/24/2022   Procedure: BILIARY BRUSHING;  Surgeon: Brice Campi Albino Alu., MD;  Location: [facility] ENDOSCOPY;  Service: Gastroenterology;;   BILIARY DILATION  03/24/2022   Procedure: BILIARY DILATION;  Surgeon: Normie Becton., MD;  Location: [facility] ENDOSCOPY;  Service: Gastroenterology;;   BILIARY STENT PLACEMENT  03/24/2022   Procedure: BILIARY STENT PLACEMENT;  Surgeon: Normie Becton., MD;  Location: [facility] ENDOSCOPY;  Service: Gastroenterology;;   BILIARY STENT PLACEMENT N/A 02/26/2023   Procedure: BILIARY STENT PLACEMENT;  Surgeon: Normie Becton., MD;  Location: [facility]OSCOPY;  Service: Gastroenterology;  Laterality: N/A;   BIOPSY  03/24/2022   Procedure: BIOPSY;  Surgeon: Brice Campi Albino Alu., MD;  Location: [facility] ENDOSCOPY;  Service: Gastroenterology;;   BIOPSY  11/23/2022   Procedure: BIOPSY;  Surgeon: Tobin Forts, MD;  Location: [facility]OS[facility];  Service: Gastroenterology;;   BIOPSY  02/26/2023   Procedure: BIOPSY;  Surgeon: Normie Becton., MD;  Location: [facility]OSCOPY;  Service: Gastroenterology;;   CESAREAN SECTION     x2   COLONOSCOPY N/A 11/23/2022   Procedure: COLONOSCOPY;  Surgeon: Tobin Forts, MD;  Location: [facility]OSCOPY;  Service: Gastroenterology;  Laterality: N/A;   CYSTOSCOPY WITH RETROGRADE PYELOGRAM, URETEROSCOPY AND STENT PLACEMENT Right 10/22/2020   Procedure: CYSTOSCOPY WITH RETROGRADE PYELOGRAM, URETEROSCOPY AND STENT PLACEMENT;  Surgeon: Roxane Copp, MD;  Location: [facility] ORS;  Service: Urology;  Laterality: Right;   ENDOSCOPIC RETROGRADE CHOLANGIOPANCREATOGRAPHY (ERCP) WITH PROPOFOL  N/A  02/26/2023   Procedure: ENDOSCOPIC RETROGRADE CHOLANGIOPANCREATOGRAPHY (ERCP) WITH PROPOFOL ;  Surgeon: Brice Campi Albino Alu., MD;  Location: [facility] ENDOSCOPY;  Service: Gastroenterology;  Laterality: N/A;   ERCP N/A 03/24/2022   Procedure: ENDOSCOPIC RETROGRADE CHOLANGIOPANCREATOGRAPHY (ERCP);  Surgeon:  Normie Becton., MD;  Location: [facility] ENDOSCOPY;  Service: Gastroenterology;  Laterality: N/A;   ESOPHAGOGASTRODUODENOSCOPY (EGD) WITH PROPOFOL  N/A 02/26/2023   Procedure: ESOPHAGOGASTRODUODENOSCOPY (EGD) WITH PROPOFOL ;  Surgeon: Brice Campi Albino Alu., MD;  Location: [facility] ENDOSCOPY;  Service: Gastroenterology;  Laterality: N/A;  patient needs EGD in addition to ERCP   IR PATIENT EVAL TECH 0-60 MINS  12/15/2022   IR PERC CHOLECYSTOSTOMY  03/11/2023   IR RADIOLOGIST EVAL & MGMT  09/16/2019   IR RADIOLOGIST EVAL & MGMT  09/23/2019   IR RADIOLOGIST EVAL & MGMT  09/20/2019   IR RADIOLOGIST EVAL & MGMT  09/30/2019   IR RADIOLOGIST EVAL & MGMT  04/20/2023   IR REMOVAL TUN ACCESS W/ PORT W/O FL MOD SED  09/14/2019   IR VENO/EXT/UNI RIGHT  04/11/2022   PANCREATIC STENT PLACEMENT  03/24/2022   Procedure: PANCREATIC STENT PLACEMENT;  Surgeon: Normie Becton., MD;  Location: [facility] ENDOSCOPY;  Service: Gastroenterology;;   REMOVAL OF STONES  03/24/2022   Procedure: REMOVAL OF STONES;  Surgeon: Normie Becton., MD;  Location: [facility] ENDOSCOPY;  Service: Gastroenterology;;   REMOVAL OF STONES  02/26/2023   Procedure: REMOVAL OF Sludge;  Surgeon: Normie Becton., MD;  Location: [facility];  Service: Gastroenterology;;   [name]  03/24/2022   Procedure: [name];  Surgeon: Normie Becton., MD;  Location: [facility] ENDOSCOPY;  Service: Gastroenterology;;   [name] REMOVAL  02/26/2023   Procedure: STENT REMOVAL;  Surgeon: Normie Becton., MD;  Location: [facility] ENDOSCOPY;  Service: Gastroenterology;;   URETERAL STENT PLACEMENT       Outpatient Encounter Medications as of 05/14/2023  Medication Sig Note   metoCLOPramide  (REGLAN ) 10 MG tablet Take 1 tablet (10 mg total) by mouth 4 (four) times daily.    amLODipine  (NORVASC ) 10 MG tablet Take 1 tablet (10 mg total) by mouth daily.    cloNIDine  (CATAPRES ) 0.1 MG tablet Take 1 tablet (0.1 mg total) by mouth 3 (three) times daily. 03/31/2023:  Takes BID   dexamethasone  (DECADRON ) 4 MG tablet Take 1 tablet by mouth twice a day for 3 days beginning on day 2 of chemo.    dicyclomine  (BENTYL ) 20 MG tablet Take 1 tablet (20 mg total) by mouth 3 (three) times daily as needed for spasms (rectal/AB pain). (Patient taking differently: Take 20 mg by mouth 2 (two) times daily as needed for spasms (rectal/AB pain).)    HYDROmorphone  (DILAUDID ) 4 MG tablet Take 1-2 tablets (4-8 mg total) by mouth every 4 (four) hours as needed for severe pain (pain score 7-10) or moderate pain (pain score 4-6).    lactulose  (CHRONULAC ) 10 GM/15ML solution Take 15-30 mLs (10-20 g total) by mouth daily as needed for mild constipation. 04/16/2023: Averages few times/week   linaclotide  (LINZESS ) 72 MCG capsule Take 72 mcg by mouth daily as needed (constipation). (Patient not taking: Reported on 03/17/2023)    metoprolol  tartrate (LOPRESSOR ) 50 MG tablet Take 1 tablet (50 mg total) by mouth 2 (two) times daily.    pantoprazole  (PROTONIX ) 40 MG tablet Take 1 tablet (40 mg total) by mouth daily before breakfast.    polyethylene glycol (MIRALAX  / GLYCOLAX ) 17 g packet Take 17 g by mouth daily as needed. (Patient taking differently: Take 17 g by mouth daily  as needed for moderate constipation or mild constipation.) 03/24/2023: Unk Last dose   potassium chloride  (MICRO-K ) 10 MEQ CR capsule Take 2 capsules (20 mEq total) by mouth 2 (two) times daily. (Patient taking differently: Take 30 mEq by mouth 2 (two) times daily.) 04/16/2023: Takes #2 capsules bid   prochlorperazine  (COMPAZINE ) 10 MG tablet Take 1 tablet (10 mg total) by mouth every 6 (six) hours as needed for nausea or vomiting.    Sodium Chloride  Flush (NORMAL SALINE FLUSH) 0.9 % SOLN Use 5 mLs by Intracatheter route daily in biliary drain at 6 (six) AM as directed    [DISCONTINUED] HYDROmorphone  (DILAUDID ) 4 MG tablet Take 1 tablet (4 mg total) by mouth every 4 (four) hours as needed for severe pain (pain score 7-10) or moderate  pain (pain score 4-6).    No facility-administered encounter medications on file as of 05/14/2023.     There were no vitals filed for this visit. There is no height or weight on file to calculate BMI.    PHYSICAL EXAM GENERAL: awake, no distress, chronically ill appearing SKIN: no rash  EYES: sclera clear LUNGS: clear with normal breathing effort HEART: regular rate & rhythm, no lower extremity edema ABDOMEN: abdomen soft, non-tender and normal bowel sounds NEURO: alert & oriented x 3 with fluent speech  CBC    Latest Ref Rng & Units 05/07/2023   10:48 AM 04/16/2023   10:16 AM 04/02/2023   10:24 AM  CBC  WBC 4.0 - 10.5 K/uL 7.0  8.5  7.3   Hemoglobin 12.0 - 15.0 g/dL 09.8  11.9  14.7   Hematocrit 36.0 - 46.0 % 34.5  32.8  33.4   Platelets 150 - 400 K/uL 297  336  364       CMP     Latest Ref Rng & Units 05/14/2023    8:50 AM 05/07/2023   10:48 AM 04/16/2023   10:16 AM  CMP  Glucose 70 - 99 mg/dL 85  829  82   BUN 6 - 20 mg/dL 6  6  6    Creatinine 0.44 - 1.00 mg/dL 5.62  1.30  8.65   Sodium 135 - 145 mmol/L 136  136  136   Potassium 3.5 - 5.1 mmol/L 2.9  2.8  3.3   Chloride 98 - 111 mmol/L 96  95  96   CO2 22 - 32 mmol/L 30  31  31    Calcium  8.9 - 10.3 mg/dL 9.8  9.7  9.6   Total Protein 6.5 - 8.1 g/dL  7.3  7.8   Total Bilirubin 0.0 - 1.2 mg/dL  0.4  0.5   Alkaline Phos 38 - 126 U/L  812  859   AST 15 - 41 U/L  57  89   ALT 0 - 44 U/L  58  66       ASSESSMENT & PLAN:  Sigmoid colon cancer, stage IV (pT4a,pN2b,M1c) Colonoscopy 03/24/2019-3 rectal polyps-hyperplastic polyps, distal colon biopsy-at least intramucosal adenocarcinoma, completely obstructing mass in the distal sigmoid colon, could not be traversed, intact mismatch repair protein expression 03/24/2019-CEA 56.1 03/29/2019 CT abdomen/pelvis-circumferential thickening involving the entire mid and distal sigmoid colon to the level of the rectosigmoid junction, solid/cystic mass in the left ovary, bilateral  nephrolithiasis Robotic assisted low anterior resection, mesenteric lymphadenectomy, bilateral salpingo-oophorectomy 04/08/2019 Pathology ([facility] review of outside pathology) metastatic adenocarcinoma involving the peritoneum overlying the round ligament, serosa of the urinary bladder, serosa of the right ureter a sacral area, pelvic peritoneum,  and left ovary.  Omental biopsy with focal mucin pools with no carcinoma cells identified, right hemidiaphragm biopsy involved by metastatic adenocarcinoma, invasive adenocarcinoma the sigmoid colon, moderately differentiated, T4a, perineural and vascular invasion present, 7/22 lymph nodes, multiple tumor deposits, resection margins negative Negative for PD-L1, low probability of MSI-high, HER-2 negative, negative for BRAF, NRAS and KRAS alterations CTs 05/19/2019-no evidence of metastatic disease, findings suspicious for colitis of the transverse and ascending colon, small amount of ascites in the cul-de-sac, bilateral renal calculi Cycle 1 FOLFIRI 05/24/2019, bevacizumab  added with cycle 2 Cycle 4 FOLFIRI/bevacizumab  07/05/2019 Cycle 5 FOLFIRI 07/27/2019, bevacizumab  held secondary to hypertension Cycle 6 FOLFIRI 08/10/1999, bevacizumab  held secondary to hypertension CTs 08/30/2019-no evidence of recurrent disease, new subsolid right lower lobe nodule felt to be inflammatory, emphysema Cycle 7 FOLFIRI 09/26/2019, bevacizumab  remains on hold secondary to hypertension Cycle 8 FOLFIRI 10/17/2019, bevacizumab  held secondary to hypertension, Udenyca  added for neutropenia Cycle 9 FOLFIRI 11/07/2019, bevacizumab  held secondary to hypertension, Udenyca  Cycle 10 FOLFIRI 11/28/2019, bevacizumab  held, Udenyca  Cycle 11 FOLFIRI 12/26/2019, bevacizumab  held, Udenyca  CTs 01/25/2020-no evidence of recurrent disease, resolution of right lower lobe nodule Maintenance Xeloda  beginning 02/06/2020 CTs 04/25/2020- no evidence of metastatic disease, multiple bilateral renal calculi without  hydronephrosis Maintenance Xeloda  continued CT abdomen/pelvis without contrast 08/10/2020-bilateral staghorn renal calculi, no evidence of metastatic disease; addendum 08/23/2020-small but increasing omental nodules. Cycle 1 FOLFIRI/panitumumab  09/19/2020 Cycle 2 FOLFIRI/Panitumumab  10/03/2020 Cycle 3 FOLFIRI/Panitumumab  10/17/2020 Cycle 4 FOLFIRI/panitumumab  10/31/2020 Cycle 5 FOLFIRI/Panitumumab  11/14/2020 CTs 11/26/2020-stable omental metastases compared to 10/22/2020, mildly decreased from 08/10/2020.  Left lower quadrant tiny paracolic gutter implant slightly decreased from CT 08/10/2020.  No new or progressive metastatic disease in the abdomen or pelvis. Cycle 6 FOLFIRI/Panitumumab  11/28/2020, irinotecan  dose reduced, treatment schedule adjusted to every 3 weeks going forward Cycle 7 FOLFIRI/panitumumab  12/24/2020 Cycle 8 FOLFIRI/Panitumumab  01/16/2021--treatment held due to hypertension in the infusion area. Cycle 8 FOLFIRI/panitumumab  01/21/2021 Cycle 9 FOLFIRI/panitumumab  02/11/2021 Cycle 10 FOLFIRI/Panitumumab  03/04/2021 Cycle 11 FOLFIRI/Panitumumab  03/25/2021 CT abdomen/pelvis 04/10/2021-stable right omental and left posterior paracolic gutter nodules Cycle 12 FOLFIRI/panitumumab  04/15/2021 Cycle 13 FOLFIRI/panitumumab  05/06/2021 Cycle 14 FOLFIRI/Panitumumab  05/27/2021 Cycle 15 FOLFIRI/Panitumumab  06/17/2021 CT abdomen/pelvis 07/05/2021-slight enlargement of a dominant right omental implant, no new implants Patient requested a treatment break CT abdomen/pelvis 10/04/2021-mild increase in size of multiple omental soft tissue nodules, 4 mm calculus in the distal left ureter adjacent to the ureteral stent Cycle 1 Lonsurf  10/14/2021 10/28/2021 Avastin  every 2 weeks Cycle 2 Lonsurf  11/11/2021 Cycle 3 Lonsurf  12/09/2021 Cycle 4 Lonsurf  01/06/2022 Avastin  held 01/20/2022 due to proteinuria, 24-hour urine 43 mg CT/pelvis 01/30/2022-possible new peritoneal implant at the transverse colon, no significant change in  other omental/peritoneal implants, stable chronic rectal wall thickening, status post removal of double-J left ureteral stent with no evidence of hydronephrosis or ureteral calculus, nonobstructing bilateral renal calculi Cycle 5 Lonsurf  02/05/2022 or 02/06/2022 Avastin  every 2 weeks Cycle 6 Lonsurf  03/03/2022 CTs 04/05/2022-increase in size and number of peritoneal implants compared to January 2024 central liver mass narrowing the anterior branch of the right portal vein, decreased biliary duct dilation, biliary stents in place, stable right cardiophrenic lymph nodes, separate pigtail stent in the lumen of the proximal duodenum Cycle 1 FOLFOX 04/14/2022 CT abdomen/pelvis 04/16/2022-stent within the third portion of the duodenum, stable peritoneal nodules, indwelling biliary stents with mild left biliary duct dilation Cycle 2 FOLFOX 04/28/2022, Emend and prophylactic dexamethasone  added Cycle 3 FOLFOX 05/12/2022, Aloxi , Emend, prophylactic dexamethasone , Compazine  and lorazepam  as needed Chemotherapy held 05/25/2020 due to severe hypertension, evaluated in  the emergency department Cycle 4 FOLFOX 06/03/2022 Cycle 5 FOLFOX 06/16/2022, oxaliplatin  dose reduced and 5-FU bolus eliminated due to neutropenia CT 06/25/2022-unchanged soft tissue at the hepatic hilum, unchanged, bile duct stent, decreased size of peritoneal and omental nodules and diminished volume of likely fluid in the lower left pelvis Cycle 6 FOLFOX 06/30/2022 Cycle 7 FOLFOX 07/21/2022 CT 07/24/2022 (in the emergency department with nausea, headache, abdominal pain)-no acute pathology.  No significant interval change in omental nodularity. Cycle 8 FOLFOX 08/04/2022, pruritus on the palms at the end of the oxaliplatin  infusion, Pepcid  and Benadryl  given, symptoms resolved, infusion not resumed Cycle 9 FOLFOX 08/18/2022-Pepcid  and Benadryl  added to premedications, Oxaliplatin  diluted in a larger volume and infusion time increased Cycle 10 FOLFOX  09/08/2022 Cycle 11 FOLFOX 09/22/2022 10/06/2022 patient declined treatment CTs 10/15/2022-similar soft tissue fullness in the hepatic hilum with biliary stents in place, decrease in omental/peritoneal metastases Maintenance capecitabine  10/26/2022 CT abdomen/pelvis 11/19/2022-mild increase in tumor studding at the left paracolic gutter and omentum, circumferential wall thickening of the rectum, biliary stents in place with minimal increase in intrahepatic biliary dilation, nonobstructive renal calculi Colonoscopy 11/23/2022-anastomosis at 15 cm from the anal verge-patent, 2 cm polypoid tissue at the anastomosis biopsied, invasive adenocarcinoma moderately differentiated, preserved expression of major MMR proteins CTs 12/15/2022: Possible developing nodule in the lateral left liver, increased omental nodularity, persistent wall thickening in the upper rectum CT abdomen/pelvis 12/24/2022: Increased thickened fold in the transverse colon-infectious/inflammatory, 2.1 cm lateral left liver lesion, potential new 5 mm segment 5 lesion, stable biliary stents with intrahepatic biliary dilation, central biliary wall thickening near the hepatic hilum, multiple peritoneal/omental implants unchanged from 12/15/2022, but likely larger than 11/19/2022 Cycle 1 irinotecan /Panitumumab  01/06/2023 Cycle 2 irinotecan /Panitumumab  01/20/2023 CTs 01/24/2023-slight increase in omental nodularity. Cycle 3 irinotecan /Panitumumab  02/03/2023 Cycle 4 irinotecan /panitumumab  02/17/2023 CTs abdomen/pelvis 02/24/2023-subtly hypoattenuating lesion within the peripheral aspect of hepatic segment 2 redemonstrated.  Additional scattered subcentimeter low-density lesions within the liver too small to characterize.  Prominent intrahepatic biliary dilatation with biliary stents in place.  Similar degree of nodularity along the omentum and left paracolic gutter.  No ascites. CTs 03/09/2023-indwelling endoscopically placed stents; level of intrahepatic  biliary duct dilatation is slightly improved on the left and some on the right.  Gallbladder is dilated with wall edema which is increasing.  More nodular tissue extending up along the porta hepatis.  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  Rectum with nodular wall thickening with extension into the adjacent tissue, nodular areas seen extending perirectal and presacral spaces.  Peritoneal carcinomatosis again identified. Irinotecan /Panitumumab  04/02/2023 Irinotecan /panitumumab  04/16/2023 Irinotecan /Panitumumab  05/07/2023   Hypertension G4 P3, twins Kidney stones-bilateral staghorn renal calculi on CT 08/10/2020 Infected Port-A-Cath 09/12/2019-placed on Augmentin , referred for Port-A-Cath removal; Port-A-Cath removed 09/14/2019; PICC line placed 09/14/2019; culture staph aureus.  Course of Septra  completed. Neutropenia secondary to chemotherapy-Udenyca  added with cycle 8 FOLFIRI Right nephrostomy tube 09/12/2020; stent placed 10/09/2020, percutaneous nephrostolithotomy treatment of right-sided kidney stones 10/09/2020, malpositioned right ureter stent replacement 10/23/2020, right ureter stent removed 10/29/2020 Percutaneous left nephrostolithotomy 10/01/2021-no renal stone identified, left ureter stent left in place Cystoscopy 10/14/2021-stent removed 8.  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  2 moderate biliary  strictures found in the hepatic duct system, malignant appearing.  Left and right hepatic duct and all intrahepatic branches were moderately dilated secondary to the strictures.  The strictures were dilated.  Plastic stent placed into the left hepatic duct and right hepatic duct. 9.  Admission 04/16/2022 with nausea/vomiting and abdominal pain, felt to be acute toxicity related to chemotherapy, discharged home 04/18/2022 10.  Admission 11/19/2022 with nausea/vomiting and abdominal pain-improved 11.  Admission 12/15/2022 with fever and right submandibular swelling/tenderness CT neck 12/15/2022: 2 mm distal right submandibular duct calculus with mild inflammatory change adjacent to the right submandibular gland 12.  Admission 12/24/2022 with a fever nausea/vomiting, diarrhea, Klebsiella bacteremia completed course of Augmentin  13.  Admission with abdominal pain and nausea/vomiting 03/09/2023 through 03/14/2023-concern for gallbladder inflammation.  Not felt to be a surgical candidate.  Percutaneous cholecystostomy tube placed 03/11/2023.  Tube removed 04/24/2023.       Disposition: April [name] presents for scheduled IV fluids, chronically ill-appearing, with N/V and abdominal/rectal pain. She is having difficulty managing at home. No weight loss. VSS. BMP shows persistent hypokalemia. A recent Mag was normal. She is not able to take adequate KCL at home.   She was given 1 L NS, 8 mg zofran  IV, 2 mg morphine  IV + 40 mEq KCL po in clinic. Upon reassessment she does not feel much better, morphine  did not help.   Further reassessment with Dr. Scherrie Curt. Pain is unchanged. Will give additional 4 mg Morphine  IV, increase home dilaudid  to 1-2 tabs q4 PRN, and change compazine  to reglan  for N/V.   She will be reassessed, at which point we will either admit her for symptom management or discharge her.    All questions were answered. The patient knows to call the clinic with any problems, questions or concerns. No  barriers to learning were detected.   April Roberti K Tennile Styles, NP 05/14/2023   This was a shared visit with Malayzia Laforte.  April [name] was interviewed and examined.  Presents today with increased abdominal pain and nausea.  She completed another cycle of irinotecan /panitumumab  last week.  Her symptoms could be related to irinotecan , but I am concerned she has disease progression.  We adjusted the narcotic and antiemetic regimen today.  Her pain improved following IV morphine  in the office today.  She will return for an office visit and chemotherapy in 2 weeks.  We will plan for a restaging CT evaluation after the next cycle of chemotherapy.  She will call if her pain and nausea are not improved.  We recommended she resume the antihypertensive regimen.  I was present for greater than 50% of today's visit.  I performed medical decision making.  Anise Kerns, MD

## 2023-05-14 NOTE — Progress Notes (Signed)
 Patients PICC line flushed without difficulty.  Good blood return noted with no bruising or swelling noted at site.  Patient to get IVF today.

## 2023-05-14 NOTE — Patient Instructions (Signed)
 [facility] - A DEPT OF [facility]  Discharge Instructions: Thank you for choosing [facility] to provide your oncology and hematology care.   If you have a lab appointment with the Cancer Center, please go directly to the Cancer Center and check in at the registration area.   Wear comfortable clothing and clothing appropriate for easy access to any Portacath or PICC line.   We strive to give you quality time with your provider. You may need to reschedule your appointment if you arrive late (15 or more minutes).  Arriving late affects you and other patients whose appointments are after yours.  Also, if you miss three or more appointments without notifying the office, you may be dismissed from the clinic at the provider's discretion.      For prescription refill requests, have your pharmacy contact our office and allow 72 hours for refills to be completed.    Today you received the following Normal Saline infusion with oral potassium and pain management.  Potassium Chloride  Extended-Release Capsules or Tablets What is this medication? POTASSIUM CHLORIDE  (poe TASS i um KLOOR ide) prevents and treats low levels of potassium in your body. Potassium plays an important role in maintaining the health of your kidneys, heart, muscles, and nervous system. This medicine may be used for other purposes; ask your health care provider or pharmacist if you have questions. COMMON BRAND NAME(S): K-10, K-8, Klor-Con , Micro-K , Micro-K  Extencaps What should I tell my care team before I take this medication? They need to know if you have any of these conditions: Addison disease Dehydration Diabetes, high blood sugar Difficulty swallowing Heart disease High levels of potassium in the blood Irregular heartbeat or rhythm Kidney disease Large areas of burned skin Stomach ulcers, other stomach or intestine problems An unusual or allergic reaction to potassium, other  medications, foods, dyes, or preservatives Pregnant or trying to get pregnant Breast-feeding How should I use this medication? Take this medication by mouth with a glass of water . Take it as directed on the prescription label at the same time every day. Take it with food. Do not cut, crush, chew, or suck this medication. Swallow the capsules whole. You may open the capsule and put the contents in a teaspoon of soft food, such as applesauce or pudding. Do not add to hot foods. Swallow the mixture right away. Do not chew the mixture. Drink a glass of water  or juice after taking the mixture. Keep taking this medication unless your care team tells you to stop. Talk to your care team about the use of this medication in children. Special care may be needed. Overdosage: If you think you have taken too much of this medicine contact a poison control center or emergency room at once. NOTE: This medicine is only for you. Do not share this medicine with others. What if I miss a dose? If you miss a dose, take it as soon as you can. If it is almost time for your next dose, take only that dose. Do not take double or extra doses. What may interact with this medication? Do not take this medication with any of the following: Certain diuretics, such as spironolactone, triamterene Certain medications for stomach problems, such as atropine ; difenoxin or glycopyrrolate Eplerenone Sodium polystyrene sulfonate This medication may also interact with the following: Certain medications for blood pressure or heart disease, such as lisinopril, losartan, quinapril, valsartan  Medications that lower your chance of fighting infection, such as  cyclosporine, tacrolimus NSAIDs, medications for pain and inflammation, such as ibuprofen  or naproxen Other potassium supplements Salt substitutes This list may not describe all possible interactions. Give your health care provider a list of all the medicines, herbs, non-prescription  drugs, or dietary supplements you use. Also tell them if you smoke, drink alcohol, or use illegal drugs. Some items may interact with your medicine. What should I watch for while using this medication? Visit your care team for regular check-ups. You will need lab work done regularly. You may need to be on a special diet while taking this medication. Ask your care team. What side effects may I notice from receiving this medication? Side effects that you should report to your care team as soon as possible: Allergic reactions--skin rash, itching, hives, swelling of the face, lips, tongue, or throat Bowel blockage--stomach cramping, unable to have a bowel movement or pass gas, loss of appetite, vomiting Esophageal ulcer--loss of appetite, throat pain, pain or trouble swallowing, heartburn, nausea, vomiting, dry cough High potassium level--muscle weakness, fast or irregular heartbeat Stomach bleeding--bloody or black, tar-like stools, vomiting blood or brown material that looks like coffee grounds Side effects that usually do not require medical attention (report to your care team if they continue or are bothersome): Diarrhea Gas Nausea Stomach pain Vomiting This list may not describe all possible side effects. Call your doctor for medical advice about side effects. You may report side effects to FDA at 1-800-FDA-1088. Where should I keep my medication? Keep out of the reach of children. Store at room temperature between 15 and 30 degrees C (59 and 86 degrees F ). Keep bottle closed tightly to protect this medication from light and moisture. Throw away any unused medication after the expiration date. NOTE: This sheet is a summary. It may not cover all possible information. If you have questions about this medicine, talk to your doctor, pharmacist, or health care provider.  2024 Elsevier/Gold Standard (2021-07-12 00:00:00)   To help prevent nausea and vomiting after your treatment, we encourage  you to take your nausea medication as directed.  BELOW ARE SYMPTOMS THAT SHOULD BE REPORTED IMMEDIATELY: *FEVER GREATER THAN 100.4 F (38 C) OR HIGHER *CHILLS OR SWEATING *NAUSEA AND VOMITING THAT IS NOT CONTROLLED WITH YOUR NAUSEA MEDICATION *UNUSUAL SHORTNESS OF BREATH *UNUSUAL BRUISING OR BLEEDING *URINARY PROBLEMS (pain or burning when urinating, or frequent urination) *BOWEL PROBLEMS (unusual diarrhea, constipation, pain near the anus) TENDERNESS IN MOUTH AND THROAT WITH OR WITHOUT PRESENCE OF ULCERS (sore throat, sores in mouth, or a toothache) UNUSUAL RASH, SWELLING OR PAIN  UNUSUAL VAGINAL DISCHARGE OR ITCHING   Items with * indicate a potential emergency and should be followed up as soon as possible or go to the Emergency Department if any problems should occur.  Please show the CHEMOTHERAPY ALERT CARD or IMMUNOTHERAPY ALERT CARD at check-in to the Emergency Department and triage nurse.  Should you have questions after your visit or need to cancel or reschedule your appointment, please contact [facility] - A DEPT OF [facility]  Dept: [phone]  and follow the prompts.  Office hours are 8:00 a.m. to 4:30 p.m. Monday - Friday. Please note that voicemails left after 4:00 p.m. may not be returned until the following business day.  We are closed weekends and major holidays. You have access to a nurse at all times for urgent questions. Please call the main number to the clinic Dept: [phone] and follow the prompts.   For  any non-urgent questions, you may also contact your provider using MyChart. We now offer e-Visits for anyone 4 and older to request care online for non-urgent symptoms. For details visit [email or web address].   Also download the MyChart app! Go to the app store, search "MyChart", open the app, select [facility], and log in with your MyChart username and password.

## 2023-05-20 ENCOUNTER — Telehealth: Payer: Self-pay | Admitting: Dietician

## 2023-05-20 ENCOUNTER — Inpatient Hospital Stay: Admitting: Dietician

## 2023-05-20 NOTE — Telephone Encounter (Signed)
 Attempted to reach patient for a scheduled remote nutrition consult. She answered but said "things are going good but I don't have time to talk now."  Offered to return call this afternoon for her follow up nutrition consult, she said she would call me.  Carleen Chary, RDN, LDN Registered Dietitian, [facility] Part Time Remote (Usual office hours: Tuesday-Thursday) Cell: [phone]

## 2023-05-21 ENCOUNTER — Inpatient Hospital Stay

## 2023-05-21 ENCOUNTER — Telehealth: Payer: Self-pay | Admitting: *Deleted

## 2023-05-21 ENCOUNTER — Encounter

## 2023-05-21 DIAGNOSIS — C19 Malignant neoplasm of rectosigmoid junction: Secondary | ICD-10-CM

## 2023-05-21 DIAGNOSIS — C187 Malignant neoplasm of sigmoid colon: Secondary | ICD-10-CM | POA: Diagnosis not present

## 2023-05-21 LAB — BASIC METABOLIC PANEL - CANCER CENTER ONLY
Anion gap: 10 (ref 5–15)
BUN: 5 mg/dL — ABNORMAL LOW (ref 6–20)
CO2: 29 mmol/L (ref 22–32)
Calcium: 9.6 mg/dL (ref 8.9–10.3)
Chloride: 98 mmol/L (ref 98–111)
Creatinine: 0.41 mg/dL — ABNORMAL LOW (ref 0.44–1.00)
GFR, Estimated: >60 mL/min (ref >60)
Glucose, Bld: 86 mg/dL (ref 70–99)
Potassium: 3.2 mmol/L — ABNORMAL LOW (ref 3.5–5.1)
Sodium: 137 mmol/L (ref 135–145)

## 2023-05-21 NOTE — Telephone Encounter (Signed)
-----   Message from Coni Deep sent at 05/21/2023 12:49 PM EDT ----- Please call patient, please be sure she is taking potassium, be sure her potassium and magnesium  level are ordered with next visit

## 2023-05-21 NOTE — Telephone Encounter (Addendum)
 Called April [name] with BMP results-confirmed she is taking total of 40 meq KCL daily. No diarrhea. Will recheck BMP and Mg+ next week. She proceeds to tell RN that she has noted some swelling in anterior neck and right cheek with tenderness on intermittent basis for several days that her hydromorphone  does not always help. Has been putting heat on area at night with some relief. No fever is noted and she does not feel bad. Does not have any specific dental pain. Asking for MD thoughts and if she needs an antibiotic (she was in clinic today for PICC dressing change and this was not mentioned to nurse). Confirmed that the swelling/tenderness is not her left side (PICC side) and this was confirmed and there is no issue with her PICC arm. Instructed her to rinse mouth several times/day with baking soda/salt rinses and call if she gets fever or swelling worsens and then will see her. She agrees to this plan.

## 2023-05-22 ENCOUNTER — Encounter (HOSPITAL_COMMUNITY): Payer: Self-pay | Admitting: *Deleted

## 2023-05-22 ENCOUNTER — Emergency Department (HOSPITAL_COMMUNITY)

## 2023-05-22 ENCOUNTER — Other Ambulatory Visit: Payer: Self-pay

## 2023-05-22 ENCOUNTER — Emergency Department (HOSPITAL_COMMUNITY)
Admission: EM | Admit: 2023-05-22 | Discharge: 2023-05-22 | Disposition: A | Discharge status: Home / Self Care | Attending: Emergency Medicine | Admitting: Emergency Medicine

## 2023-05-22 DIAGNOSIS — Z85038 Personal history of other malignant neoplasm of large intestine: Secondary | ICD-10-CM | POA: Insufficient documentation

## 2023-05-22 DIAGNOSIS — Z87891 Personal history of nicotine dependence: Secondary | ICD-10-CM | POA: Diagnosis not present

## 2023-05-22 DIAGNOSIS — Z79899 Other long term (current) drug therapy: Secondary | ICD-10-CM | POA: Insufficient documentation

## 2023-05-22 DIAGNOSIS — E876 Hypokalemia: Secondary | ICD-10-CM | POA: Insufficient documentation

## 2023-05-22 DIAGNOSIS — I10 Essential (primary) hypertension: Secondary | ICD-10-CM | POA: Diagnosis not present

## 2023-05-22 DIAGNOSIS — R221 Localized swelling, mass and lump, neck: Secondary | ICD-10-CM | POA: Diagnosis present

## 2023-05-22 LAB — I-STAT CHEM 8, ED
BUN: 7 mg/dL (ref 6–20)
Calcium, Ion: 1.07 mmol/L — ABNORMAL LOW (ref 1.15–1.40)
Chloride: 98 mmol/L (ref 98–111)
Creatinine, Ser: 0.5 mg/dL (ref 0.44–1.00)
Glucose, Bld: 92 mg/dL (ref 70–99)
HCT: 35 % — ABNORMAL LOW (ref 36.0–46.0)
Hemoglobin: 11.9 g/dL — ABNORMAL LOW (ref 12.0–15.0)
Potassium: 3 mmol/L — ABNORMAL LOW (ref 3.5–5.1)
Sodium: 136 mmol/L (ref 135–145)
TCO2: 28 mmol/L (ref 22–32)

## 2023-05-22 LAB — CBC WITH DIFFERENTIAL/PLATELET
Abs Immature Granulocytes: 0.01 10*3/uL (ref 0.00–0.07)
Basophils Absolute: 0 10*3/uL (ref 0.0–0.1)
Basophils Relative: 1 %
Eosinophils Absolute: 0.5 10*3/uL (ref 0.0–0.5)
Eosinophils Relative: 6 %
HCT: 34.6 % — ABNORMAL LOW (ref 36.0–46.0)
Hemoglobin: 10.8 g/dL — ABNORMAL LOW (ref 12.0–15.0)
Immature Granulocytes: 0 %
Lymphocytes Relative: 36 %
Lymphs Abs: 2.6 10*3/uL (ref 0.7–4.0)
MCH: 29.2 pg (ref 26.0–34.0)
MCHC: 31.2 g/dL (ref 30.0–36.0)
MCV: 93.5 fL (ref 80.0–100.0)
Monocytes Absolute: 0.5 10*3/uL (ref 0.1–1.0)
Monocytes Relative: 7 %
Neutro Abs: 3.6 10*3/uL (ref 1.7–7.7)
Neutrophils Relative %: 50 %
Platelets: 272 10*3/uL (ref 150–400)
RBC: 3.7 MIL/uL — ABNORMAL LOW (ref 3.87–5.11)
RDW: 15.9 % — ABNORMAL HIGH (ref 11.5–15.5)
WBC: 7.3 10*3/uL (ref 4.0–10.5)
nRBC: 0 % (ref 0.0–0.2)

## 2023-05-22 MED ORDER — DEXAMETHASONE SODIUM PHOSPHATE 10 MG/ML IJ SOLN
10.0000 mg | Freq: Once | INTRAMUSCULAR | Status: AC
  Administered 2023-05-22: 10 mg via INTRAVENOUS
  Filled 2023-05-22: qty 1

## 2023-05-22 MED ORDER — SODIUM CHLORIDE (PF) 0.9 % IJ SOLN
INTRAMUSCULAR | Status: AC
  Filled 2023-05-22: qty 50

## 2023-05-22 MED ORDER — KETOROLAC TROMETHAMINE 15 MG/ML IJ SOLN
15.0000 mg | Freq: Once | INTRAMUSCULAR | Status: AC
  Administered 2023-05-22: 15 mg via INTRAVENOUS
  Filled 2023-05-22: qty 1

## 2023-05-22 MED ORDER — IOHEXOL 300 MG/ML  SOLN
75.0000 mL | Freq: Once | INTRAMUSCULAR | Status: AC | PRN
  Administered 2023-05-22: 75 mL via INTRAVENOUS

## 2023-05-22 NOTE — ED Provider Notes (Signed)
 [facility] EMERGENCY DEPARTMENT AT [facility] Provider Note   CSN: [id number] Arrival date & time: 05/22/23  7829     History  Chief Complaint  Patient presents with   Jaw Pain    April [name] is a 48 y.o. female.  Patient with past medical history significant for colon cancer, sialoadenitis of submandibular gland, hypertension presents the emergency room complaining of right-sided neck discomfort.  Patient has swelling in the submandibular region.  She does not complain of difficulty breathing or swallowing.  She states her throat does not feel sore at this time.  She reports that the swelling is developed over the past 2 to 3 days and she is unable to sleep due to the discomfort.  HPI     Home Medications Prior to Admission medications   Medication Sig Start Date End Date Taking? Authorizing Provider  amLODipine  (NORVASC ) 10 MG tablet Take 1 tablet (10 mg total) by mouth daily. 11/24/22   Armenta Landau, MD  cloNIDine  (CATAPRES ) 0.1 MG tablet Take 1 tablet (0.1 mg total) by mouth 3 (three) times daily. 03/26/23   Faith Homes, MD  dexamethasone  (DECADRON ) 4 MG tablet Take 1 tablet by mouth twice a day for 3 days beginning on day 2 of chemo. 04/16/23   Sumner Ends, MD  dicyclomine  (BENTYL ) 20 MG tablet Take 1 tablet (20 mg total) by mouth 3 (three) times daily as needed for spasms (rectal/AB pain). Patient taking differently: Take 20 mg by mouth 2 (two) times daily as needed for spasms (rectal/AB pain). 02/17/23   Sumner Ends, MD  HYDROmorphone  (DILAUDID ) 4 MG tablet Take 1-2 tablets (4-8 mg total) by mouth every 4 (four) hours as needed for severe pain (pain score 7-10) or moderate pain (pain score 4-6). 05/14/23   Burton, Lacie K, NP  lactulose  (CHRONULAC ) 10 GM/15ML solution Take 15-30 mLs (10-20 g total) by mouth daily as needed for mild constipation. 04/02/23   Roseline Conine, NP  linaclotide  (LINZESS ) 72 MCG capsule Take 72 mcg by mouth daily as  needed (constipation). Patient not taking: Reported on 03/17/2023    [provider]  metoCLOPramide  (REGLAN ) 10 MG tablet Take 1 tablet (10 mg total) by mouth 4 (four) times daily. 05/14/23   Burton, Lacie K, NP  metoprolol  tartrate (LOPRESSOR ) 50 MG tablet Take 1 tablet (50 mg total) by mouth 2 (two) times daily. 11/24/22   Armenta Landau, MD  pantoprazole  (PROTONIX ) 40 MG tablet Take 1 tablet (40 mg total) by mouth daily before breakfast. 02/27/23   Ozell Blunt, MD  polyethylene glycol (MIRALAX  / GLYCOLAX ) 17 g packet Take 17 g by mouth daily as needed. Patient taking differently: Take 17 g by mouth daily as needed for moderate constipation or mild constipation. 02/27/23   Ozell Blunt, MD  potassium chloride  (MICRO-K ) 10 MEQ CR capsule Take 2 capsules (20 mEq total) by mouth 2 (two) times daily. Patient taking differently: Take 30 mEq by mouth 2 (two) times daily. 11/24/22   Armenta Landau, MD  prochlorperazine  (COMPAZINE ) 10 MG tablet Take 1 tablet (10 mg total) by mouth every 6 (six) hours as needed for nausea or vomiting. 02/03/23   Roseline Conine, NP  Sodium Chloride  Flush (NORMAL SALINE FLUSH) 0.9 % SOLN Use 5 mLs by Intracatheter route daily in biliary drain at 6 (six) AM as directed 03/14/23   [place], Rema Care, DO      Allergies    Lisinopril, Irinotecan , Leucovorin   calcium , and Oxaliplatin     Review of Systems   Review of Systems  Physical Exam Updated Vital Signs BP 126/88   Pulse 80   Temp 98.9 F (37.2 C)   Resp 18   Ht 5\' 4"  (1.626 m)   Wt 56.7 kg   LMP 01/07/2006   SpO2 99%   BMI 21.46 kg/m  Physical Exam Vitals and nursing note reviewed.  HENT:     Head: Normocephalic and atraumatic.     Mouth/Throat:     Pharynx: Oropharynx is clear. No oropharyngeal exudate or posterior oropharyngeal erythema.     Comments: No tonsillar swelling appreciated. Eyes:     Pupils: Pupils are equal, round, and reactive to light.  Neck:     Comments: Tenderness to  palpation of the right side of the neck closer to the line of the mandible, swelling appreciated Pulmonary:     Effort: Pulmonary effort is normal. No respiratory distress.  Musculoskeletal:        General: No signs of injury.     Cervical back: Normal range of motion. Tenderness present.  Skin:    General: Skin is dry.  Neurological:     Mental Status: She is alert.  Psychiatric:        Speech: Speech normal.        Behavior: Behavior normal.     ED Results / Procedures / Treatments   Labs (all labs ordered are listed, but only abnormal results are displayed) Labs Reviewed  CBC WITH DIFFERENTIAL/PLATELET - Abnormal; Notable for the following components:      Result Value   RBC 3.70 (*)    Hemoglobin 10.8 (*)    HCT 34.6 (*)    RDW 15.9 (*)    All other components within normal limits  I-STAT CHEM 8, ED - Abnormal; Notable for the following components:   Potassium 3.0 (*)    Calcium , Ion 1.07 (*)    Hemoglobin 11.9 (*)    HCT 35.0 (*)    All other components within normal limits    EKG None  Radiology CT Soft Tissue Neck W Contrast Result Date: 05/22/2023 CLINICAL DATA:  Jaw pain for 3 days radiating to the clavicle. EXAM: CT NECK WITH CONTRAST TECHNIQUE: Multidetector CT imaging of the neck was performed using the standard protocol following the bolus administration of intravenous contrast. RADIATION DOSE REDUCTION: This exam was performed according to the departmental dose-optimization program which includes automated exposure control, adjustment of the mA and/or kV according to patient size and/or use of iterative reconstruction technique. CONTRAST:  75mL OMNIPAQUE  IOHEXOL  300 MG/ML  SOLN COMPARISON:  12/15/2022 FINDINGS: Pharynx and larynx: No evidence of inflammation or mass. Salivary glands: Small calculus seen on prior no longer detected in the floor of mouth. No acute salivary inflammation seen. Asymmetric fuller right submandibular region with probable sublingual gland  herniation through the mylohyoid, this tissue regressed in enhancement since prior. Thyroid: Unremarkable Lymph nodes: None enlarged or heterogeneous. Vascular: Left-sided line with tip at least reaching the SVC. Limited intracranial: Unremarkable Visualized orbits: Negative Mastoids and visualized paranasal sinuses: Clear Skeleton: No acute or aggressive finding. Anterior cervical spondylitic spurring. Upper chest: Blebs at the apices. IMPRESSION: No explanation for symptoms. Right submandibular ductal calculus is no longer seen and no acute salivary inflammation today. Electronically Signed   By: Ronnette Coke M.D.   On: 05/22/2023 06:09   DG Chest Portable 1 View Result Date: 05/22/2023 CLINICAL DATA:  PICC line placement. EXAM: PORTABLE CHEST  1 VIEW COMPARISON:  03/09/2023 FINDINGS: Left PICC line tip is positioned over the location of the distal SVC, near the junction with the RA. Lungs are clear. The cardiopericardial silhouette is within normal limits for size. No acute bony abnormality. IMPRESSION: Left PICC line tip is positioned at the level of the distal SVC. Electronically Signed   By: Donnal Fusi M.D.   On: 05/22/2023 05:53    Procedures Procedures    Medications Ordered in ED Medications  ketorolac  (TORADOL ) 15 MG/ML injection 15 mg (has no administration in time range)  dexamethasone  (DECADRON ) injection 10 mg (has no administration in time range)  iohexol  (OMNIPAQUE ) 300 MG/ML solution 75 mL (75 mLs Intravenous Contrast Given 05/22/23 0547)    ED Course/ Medical Decision Making/ A&P                                 Medical Decision Making Amount and/or Complexity of Data Reviewed Labs: ordered. Radiology: ordered.   This patient presents to the ED for concern of neck pain, this involves an extensive number of treatment options, and is a complaint that carries with it a high risk of complications and morbidity.  The differential diagnosis includes abscess, retropharyngeal  abscess, peritonsillar abscess, sialadenitis, others   Co morbidities that complicate the patient evaluation  Cancer, history of sialadenitis   Additional history obtained:  Additional history obtained from EMS External records from outside source obtained and reviewed including oncology notes   Lab Tests:  I Ordered, and personally interpreted labs.  The pertinent results include: Mild hypokalemia noted on Chem-8 with a potassium 3.0; no leukocytosis   Imaging Studies ordered:  I ordered imaging studies including chest x-ray, CT soft tissue neck with contrast I independently visualized and interpreted imaging which showed Left PICC line tip is positioned at the level of the distal SVC  No explanation for symptoms. Right submandibular ductal calculus is  no longer seen and no acute salivary inflammation today.   I agree with the radiologist interpretation   Problem List / ED Course / Critical interventions / Medication management   I ordered medication including Decadron  and Toradol  for pain/admission Reevaluation of the patient after these medicines showed that the patient improved I have reviewed the patients home medicines and have made adjustments as needed   Social Determinants of Health:  Patient is a former smoker   Test / Admission - Considered:  No acute findings on imaging this morning.  Unclear cause of patient's neck discomfort.  No signs of cellulitis, retropharyngeal abscess, peritonsillar abscess, or other life-threatening condition.  Patient treated with anti-inflammatories and steroids.  At this time patient appears stable for discharge home with outpatient follow-up.  Will provide contact information for ENT for follow-up as needed.  Patient voices understanding of plan.         Final Clinical Impression(s) / ED Diagnoses Final diagnoses:  Neck swelling    Rx / DC Orders ED Discharge Orders     None         Delories Fetter 05/22/23 1610    Alissa April, MD 05/22/23 [phone]

## 2023-05-22 NOTE — ED Triage Notes (Signed)
 Pt says that for about 2-3 days she has had some pain, in the right jaw that radiates down the right neck with some swelling She says that when she eats, pain is worse. She says she does sometimes get mouth sores from chemo. She did notice a small red area in the inner right cheek today. Took 2 diluadid for pain without relief.

## 2023-05-22 NOTE — ED Notes (Addendum)
 Pt. I-stat Chem 8 results 3.0, PA, Hewitt Lou made aware.

## 2023-05-22 NOTE — Discharge Instructions (Signed)
 Your workup today was overall reassuring.  There were no acute findings on your imaging this morning.  You may take over-the-counter medication at home for continued pain control.  Please follow-up as needed with your nose and throat for further evaluation.  I have provided contact information.  If you develop any life-threatening symptoms such as difficulty swallowing please return to the emergency department.

## 2023-05-22 NOTE — ED Triage Notes (Signed)
 Pt arrives from home via [facility]. 3 days of Jaw pain that radiates down through the right clavicle area. Has taken her home dilaudid . Palpated BP 130, 97 hr. No obvious warmth or redness to the area.

## 2023-05-22 NOTE — ED Notes (Signed)
 Patient verbalizes understanding of discharge instructions. Opportunity for questioning and answers were provided. Armband removed by staff, pt discharged from ED. Wheeled out to lobby, awaiting ride home with family

## 2023-05-23 ENCOUNTER — Other Ambulatory Visit: Payer: Self-pay | Admitting: Oncology

## 2023-05-23 DIAGNOSIS — C19 Malignant neoplasm of rectosigmoid junction: Secondary | ICD-10-CM

## 2023-05-28 ENCOUNTER — Telehealth: Payer: Self-pay | Admitting: Nurse Practitioner

## 2023-05-28 ENCOUNTER — Encounter: Payer: Self-pay | Admitting: Nurse Practitioner

## 2023-05-28 ENCOUNTER — Inpatient Hospital Stay

## 2023-05-28 ENCOUNTER — Inpatient Hospital Stay (HOSPITAL_BASED_OUTPATIENT_CLINIC_OR_DEPARTMENT_OTHER): Admitting: Nurse Practitioner

## 2023-05-28 ENCOUNTER — Ambulatory Visit: Admitting: Nurse Practitioner

## 2023-05-28 ENCOUNTER — Ambulatory Visit (HOSPITAL_BASED_OUTPATIENT_CLINIC_OR_DEPARTMENT_OTHER)
Admission: RE | Admit: 2023-05-28 | Discharge: 2023-05-28 | Disposition: A | Discharge status: Home / Self Care | Source: Ambulatory Visit | Attending: Nurse Practitioner | Admitting: Nurse Practitioner

## 2023-05-28 ENCOUNTER — Other Ambulatory Visit (HOSPITAL_BASED_OUTPATIENT_CLINIC_OR_DEPARTMENT_OTHER): Payer: Self-pay

## 2023-05-28 VITALS — BP 142/81 | HR 95 | Temp 97.9°F | Resp 18 | Ht 64.0 in | Wt 132.0 lb

## 2023-05-28 DIAGNOSIS — C19 Malignant neoplasm of rectosigmoid junction: Secondary | ICD-10-CM

## 2023-05-28 DIAGNOSIS — R0600 Dyspnea, unspecified: Secondary | ICD-10-CM

## 2023-05-28 DIAGNOSIS — Z95828 Presence of other vascular implants and grafts: Secondary | ICD-10-CM

## 2023-05-28 LAB — CBC WITH DIFFERENTIAL (CANCER CENTER ONLY)
Abs Immature Granulocytes: 0.01 10*3/uL (ref 0.00–0.07)
Basophils Absolute: 0.1 10*3/uL (ref 0.0–0.1)
Basophils Relative: 1 %
Eosinophils Absolute: 0.4 10*3/uL (ref 0.0–0.5)
Eosinophils Relative: 4 %
HCT: 36.2 % (ref 36.0–46.0)
Hemoglobin: 11.7 g/dL — ABNORMAL LOW (ref 12.0–15.0)
Immature Granulocytes: 0 %
Lymphocytes Relative: 26 %
Lymphs Abs: 2.1 10*3/uL (ref 0.7–4.0)
MCH: 28.8 pg (ref 26.0–34.0)
MCHC: 32.3 g/dL (ref 30.0–36.0)
MCV: 89.2 fL (ref 80.0–100.0)
Monocytes Absolute: 0.7 10*3/uL (ref 0.1–1.0)
Monocytes Relative: 8 %
Neutro Abs: 5 10*3/uL (ref 1.7–7.7)
Neutrophils Relative %: 61 %
Platelet Count: 310 10*3/uL (ref 150–400)
RBC: 4.06 MIL/uL (ref 3.87–5.11)
RDW: 15.9 % — ABNORMAL HIGH (ref 11.5–15.5)
WBC Count: 8.2 10*3/uL (ref 4.0–10.5)
nRBC: 0 % (ref 0.0–0.2)

## 2023-05-28 LAB — CMP (CANCER CENTER ONLY)
ALT: 72 U/L — ABNORMAL HIGH (ref 0–44)
AST: 93 U/L — ABNORMAL HIGH (ref 15–41)
Albumin: 3.8 g/dL (ref 3.5–5.0)
Alkaline Phosphatase: 804 U/L — ABNORMAL HIGH (ref 38–126)
Anion gap: 13 (ref 5–15)
BUN: <5 mg/dL — ABNORMAL LOW (ref 6–20)
CO2: 26 mmol/L (ref 22–32)
Calcium: 9.6 mg/dL (ref 8.9–10.3)
Chloride: 96 mmol/L — ABNORMAL LOW (ref 98–111)
Creatinine: 0.4 mg/dL — ABNORMAL LOW (ref 0.44–1.00)
GFR, Estimated: >60 mL/min (ref >60)
Glucose, Bld: 85 mg/dL (ref 70–99)
Potassium: 3.2 mmol/L — ABNORMAL LOW (ref 3.5–5.1)
Sodium: 135 mmol/L (ref 135–145)
Total Bilirubin: 0.4 mg/dL (ref 0.0–1.2)
Total Protein: 7.5 g/dL (ref 6.5–8.1)

## 2023-05-28 LAB — MAGNESIUM: Magnesium: 1.7 mg/dL (ref 1.7–2.4)

## 2023-05-28 LAB — CEA (ACCESS): CEA (CHCC): 46 ng/mL — ABNORMAL HIGH (ref 0.00–5.00)

## 2023-05-28 MED ORDER — SODIUM CHLORIDE 0.9% FLUSH
10.0000 mL | Freq: Once | INTRAVENOUS | Status: AC
  Administered 2023-05-28: 10 mL

## 2023-05-28 MED ORDER — METOCLOPRAMIDE HCL 10 MG PO TABS
10.0000 mg | ORAL_TABLET | Freq: Four times a day (QID) | ORAL | 0 refills | Status: DC
Start: 2023-05-28 — End: 2023-06-19
  Filled 2023-05-28: qty 45, 12d supply, fill #0

## 2023-05-28 MED ORDER — LORAZEPAM 2 MG/ML IJ SOLN
0.5000 mg | Freq: Once | INTRAMUSCULAR | Status: AC
  Administered 2023-05-28: 0.5 mg via INTRAVENOUS
  Filled 2023-05-28: qty 1

## 2023-05-28 MED ORDER — IOHEXOL 350 MG/ML SOLN
100.0000 mL | Freq: Once | INTRAVENOUS | Status: AC | PRN
  Administered 2023-05-28: 75 mL via INTRAVENOUS

## 2023-05-28 MED ORDER — SODIUM CHLORIDE 0.9 % IV SOLN
INTRAVENOUS | Status: AC
Start: 2023-05-28 — End: 2023-05-28

## 2023-05-28 MED ORDER — HEPARIN SOD (PORK) LOCK FLUSH 100 UNIT/ML IV SOLN
500.0000 [IU] | Freq: Once | INTRAVENOUS | Status: AC
  Administered 2023-05-28: 500 [IU]

## 2023-05-28 NOTE — Progress Notes (Signed)
 [facility] OFFICE PROGRESS NOTE   Diagnosis:  Colon cancer  INTERVAL HISTORY:   April [name] returns as scheduled.  She completed another cycle of irinotecan /Panitumumab  05/07/2023.  She reports sudden onset of chest pain and shortness of breath for the past 2 days.  She notes increased chest pain when she tries to take a deep breath.  She feels a "flutter" in her chest.  No leg swelling or calf pain.  She continues to have nausea, vomits periodically.  No diarrhea.  No rash.  She had a few mouth sores after the most recent treatment.  She states she does not feel well in general.  She continues to have episodes of diaphoresis.  Objective:  Vital signs in last 24 hours:  Blood pressure (!) 142/81, pulse 95, temperature 97.9 F (36.6 C), temperature source Temporal, resp. rate 18, height 5\' 4"  (1.626 m), weight 132 lb (59.9 kg), last menstrual period 01/07/2006, SpO2 100%.    HEENT: No thrush or ulcers. Resp: Lungs clear bilaterally. Cardio: Regular rate and rhythm. GI: Tender across the upper abdomen, mild distention. Vascular: No leg edema.  Calves soft and nontender. Neuro: Alert and oriented. Musculoskeletal: Nontender across the anterior chest wall. Left upper extremity PICC without erythema.  Lab Results:  Lab Results  Component Value Date   WBC 8.2 05/28/2023   HGB 11.7 (L) 05/28/2023   HCT 36.2 05/28/2023   MCV 89.2 05/28/2023   PLT 310 05/28/2023   NEUTROABS 5.0 05/28/2023    Imaging:  No results found.  Medications: I have reviewed the patient's current medications.  Assessment/Plan: Sigmoid colon cancer, stage IV (pT4a,pN2b,M1c) Colonoscopy 03/24/2019-3 rectal polyps-hyperplastic polyps, distal colon biopsy-at least intramucosal adenocarcinoma, completely obstructing mass in the distal sigmoid colon, could not be traversed, intact mismatch repair protein expression 03/24/2019-CEA 56.1 03/29/2019 CT abdomen/pelvis-circumferential thickening  involving the entire mid and distal sigmoid colon to the level of the rectosigmoid junction, solid/cystic mass in the left ovary, bilateral nephrolithiasis Robotic assisted low anterior resection, mesenteric lymphadenectomy, bilateral salpingo-oophorectomy 04/08/2019 Pathology ([facility] review of outside pathology) metastatic adenocarcinoma involving the peritoneum overlying the round ligament, serosa of the urinary bladder, serosa of the right ureter a sacral area, pelvic peritoneum, and left ovary.  Omental biopsy with focal mucin pools with no carcinoma cells identified, right hemidiaphragm biopsy involved by metastatic adenocarcinoma, invasive adenocarcinoma the sigmoid colon, moderately differentiated, T4a, perineural and vascular invasion present, 7/22 lymph nodes, multiple tumor deposits, resection margins negative Negative for PD-L1, low probability of MSI-high, HER-2 negative, negative for BRAF, NRAS and KRAS alterations CTs 05/19/2019-no evidence of metastatic disease, findings suspicious for colitis of the transverse and ascending colon, small amount of ascites in the cul-de-sac, bilateral renal calculi Cycle 1 FOLFIRI 05/24/2019, bevacizumab  added with cycle 2 Cycle 4 FOLFIRI/bevacizumab  07/05/2019 Cycle 5 FOLFIRI 07/27/2019, bevacizumab  held secondary to hypertension Cycle 6 FOLFIRI 08/10/1999, bevacizumab  held secondary to hypertension CTs 08/30/2019-no evidence of recurrent disease, new subsolid right lower lobe nodule felt to be inflammatory, emphysema Cycle 7 FOLFIRI 09/26/2019, bevacizumab  remains on hold secondary to hypertension Cycle 8 FOLFIRI 10/17/2019, bevacizumab  held secondary to hypertension, Udenyca  added for neutropenia Cycle 9 FOLFIRI 11/07/2019, bevacizumab  held secondary to hypertension, Udenyca  Cycle 10 FOLFIRI 11/28/2019, bevacizumab  held, Udenyca  Cycle 11 FOLFIRI 12/26/2019, bevacizumab  held, Udenyca  CTs 01/25/2020-no evidence of recurrent disease, resolution of right lower lobe  nodule Maintenance Xeloda  beginning 02/06/2020 CTs 04/25/2020- no evidence of metastatic disease, multiple bilateral renal calculi without hydronephrosis Maintenance Xeloda  continued CT abdomen/pelvis without contrast 08/10/2020-bilateral staghorn  renal calculi, no evidence of metastatic disease; addendum 08/23/2020-small but increasing omental nodules. Cycle 1 FOLFIRI/panitumumab  09/19/2020 Cycle 2 FOLFIRI/Panitumumab  10/03/2020 Cycle 3 FOLFIRI/Panitumumab  10/17/2020 Cycle 4 FOLFIRI/panitumumab  10/31/2020 Cycle 5 FOLFIRI/Panitumumab  11/14/2020 CTs 11/26/2020-stable omental metastases compared to 10/22/2020, mildly decreased from 08/10/2020.  Left lower quadrant tiny paracolic gutter implant slightly decreased from CT 08/10/2020.  No new or progressive metastatic disease in the abdomen or pelvis. Cycle 6 FOLFIRI/Panitumumab  11/28/2020, irinotecan  dose reduced, treatment schedule adjusted to every 3 weeks going forward Cycle 7 FOLFIRI/panitumumab  12/24/2020 Cycle 8 FOLFIRI/Panitumumab  01/16/2021--treatment held due to hypertension in the infusion area. Cycle 8 FOLFIRI/panitumumab  01/21/2021 Cycle 9 FOLFIRI/panitumumab  02/11/2021 Cycle 10 FOLFIRI/Panitumumab  03/04/2021 Cycle 11 FOLFIRI/Panitumumab  03/25/2021 CT abdomen/pelvis 04/10/2021-stable right omental and left posterior paracolic gutter nodules Cycle 12 FOLFIRI/panitumumab  04/15/2021 Cycle 13 FOLFIRI/panitumumab  05/06/2021 Cycle 14 FOLFIRI/Panitumumab  05/27/2021 Cycle 15 FOLFIRI/Panitumumab  06/17/2021 CT abdomen/pelvis 07/05/2021-slight enlargement of a dominant right omental implant, no new implants Patient requested a treatment break CT abdomen/pelvis 10/04/2021-mild increase in size of multiple omental soft tissue nodules, 4 mm calculus in the distal left ureter adjacent to the ureteral stent Cycle 1 Lonsurf  10/14/2021 10/28/2021 Avastin  every 2 weeks Cycle 2 Lonsurf  11/11/2021 Cycle 3 Lonsurf  12/09/2021 Cycle 4 Lonsurf  01/06/2022 Avastin  held 01/20/2022 due  to proteinuria, 24-hour urine 43 mg CT/pelvis 01/30/2022-possible new peritoneal implant at the transverse colon, no significant change in other omental/peritoneal implants, stable chronic rectal wall thickening, status post removal of double-J left ureteral stent with no evidence of hydronephrosis or ureteral calculus, nonobstructing bilateral renal calculi Cycle 5 Lonsurf  02/05/2022 or 02/06/2022 Avastin  every 2 weeks Cycle 6 Lonsurf  03/03/2022 CTs 04/05/2022-increase in size and number of peritoneal implants compared to January 2024 central liver mass narrowing the anterior branch of the right portal vein, decreased biliary duct dilation, biliary stents in place, stable right cardiophrenic lymph nodes, separate pigtail stent in the lumen of the proximal duodenum Cycle 1 FOLFOX 04/14/2022 CT abdomen/pelvis 04/16/2022-stent within the third portion of the duodenum, stable peritoneal nodules, indwelling biliary stents with mild left biliary duct dilation Cycle 2 FOLFOX 04/28/2022, Emend and prophylactic dexamethasone  added Cycle 3 FOLFOX 05/12/2022, Aloxi , Emend, prophylactic dexamethasone , Compazine  and lorazepam  as needed Chemotherapy held 05/25/2020 due to severe hypertension, evaluated in the emergency department Cycle 4 FOLFOX 06/03/2022 Cycle 5 FOLFOX 06/16/2022, oxaliplatin  dose reduced and 5-FU bolus eliminated due to neutropenia CT 06/25/2022-unchanged soft tissue at the hepatic hilum, unchanged, bile duct stent, decreased size of peritoneal and omental nodules and diminished volume of likely fluid in the lower left pelvis Cycle 6 FOLFOX 06/30/2022 Cycle 7 FOLFOX 07/21/2022 CT 07/24/2022 (in the emergency department with nausea, headache, abdominal pain)-no acute pathology.  No significant interval change in omental nodularity. Cycle 8 FOLFOX 08/04/2022, pruritus on the palms at the end of the oxaliplatin  infusion, Pepcid  and Benadryl  given, symptoms resolved, infusion not resumed Cycle 9 FOLFOX  08/18/2022-Pepcid  and Benadryl  added to premedications, Oxaliplatin  diluted in a larger volume and infusion time increased Cycle 10 FOLFOX 09/08/2022 Cycle 11 FOLFOX 09/22/2022 10/06/2022 patient declined treatment CTs 10/15/2022-similar soft tissue fullness in the hepatic hilum with biliary stents in place, decrease in omental/peritoneal metastases Maintenance capecitabine  10/26/2022 CT abdomen/pelvis 11/19/2022-mild increase in tumor studding at the left paracolic gutter and omentum, circumferential wall thickening of the rectum, biliary stents in place with minimal increase in intrahepatic biliary dilation, nonobstructive renal calculi Colonoscopy 11/23/2022-anastomosis at 15 cm from the anal verge-patent, 2 cm polypoid tissue at the anastomosis biopsied, invasive adenocarcinoma moderately differentiated, preserved expression of major MMR proteins CTs 12/15/2022: Possible  developing nodule in the lateral left liver, increased omental nodularity, persistent wall thickening in the upper rectum CT abdomen/pelvis 12/24/2022: Increased thickened fold in the transverse colon-infectious/inflammatory, 2.1 cm lateral left liver lesion, potential new 5 mm segment 5 lesion, stable biliary stents with intrahepatic biliary dilation, central biliary wall thickening near the hepatic hilum, multiple peritoneal/omental implants unchanged from 12/15/2022, but likely larger than 11/19/2022 Cycle 1 irinotecan /Panitumumab  01/06/2023 Cycle 2 irinotecan /Panitumumab  01/20/2023 CTs 01/24/2023-slight increase in omental nodularity. Cycle 3 irinotecan /Panitumumab  02/03/2023 Cycle 4 irinotecan /panitumumab  02/17/2023 CTs abdomen/pelvis 02/24/2023-subtly hypoattenuating lesion within the peripheral aspect of hepatic segment 2 redemonstrated.  Additional scattered subcentimeter low-density lesions within the liver too small to characterize.  Prominent intrahepatic biliary dilatation with biliary stents in place.  Similar degree of nodularity  along the omentum and left paracolic gutter.  No ascites. CTs 03/09/2023-indwelling endoscopically placed stents; level of intrahepatic biliary duct dilatation is slightly improved on the left and some on the right.  Gallbladder is dilated with wall edema which is increasing.  More nodular tissue extending up along the porta hepatis.  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  Rectum with nodular wall thickening with extension into the adjacent tissue, nodular areas seen extending perirectal and presacral spaces.  Peritoneal carcinomatosis again identified. Irinotecan /Panitumumab  04/02/2023 Irinotecan /panitumumab  04/16/2023 Irinotecan /Panitumumab  05/07/2023   Hypertension G4 P3, twins Kidney stones-bilateral staghorn renal calculi on CT 08/10/2020 Infected Port-A-Cath 09/12/2019-placed on Augmentin , referred for Port-A-Cath removal; Port-A-Cath removed 09/14/2019; PICC line placed 09/14/2019; culture staph aureus.  Course of Septra  completed. Neutropenia secondary to chemotherapy-Udenyca  added with cycle 8 FOLFIRI Right nephrostomy tube 09/12/2020; stent placed 10/09/2020, percutaneous nephrostolithotomy treatment of right-sided kidney stones 10/09/2020, malpositioned right ureter stent replacement 10/23/2020, right ureter stent removed 10/29/2020 Percutaneous left nephrostolithotomy 10/01/2021-no renal stone identified, left ureter stent left in place Cystoscopy 10/14/2021-stent removed 8.  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  2 moderate biliary strictures found in the hepatic duct system, malignant appearing.  Left and right hepatic duct and all intrahepatic branches were moderately dilated secondary to the strictures.  The strictures were dilated.  Plastic stent placed into the left hepatic duct and right hepatic duct. 9.  Admission 04/16/2022 with nausea/vomiting and abdominal pain, felt to be acute toxicity related to chemotherapy, discharged home 04/18/2022 10.  Admission 11/19/2022 with nausea/vomiting and abdominal pain-improved 11.  Admission 12/15/2022 with fever and right submandibular swelling/tenderness CT neck 12/15/2022: 2 mm distal right submandibular duct calculus with mild inflammatory change adjacent to the right submandibular gland 12.  Admission 12/24/2022 with a fever nausea/vomiting, diarrhea, Klebsiella bacteremia completed course of Augmentin  13.  Admission with abdominal pain and nausea/vomiting 03/09/2023 through 03/14/2023-concern for gallbladder inflammation.  Not felt to be a surgical candidate.  Percutaneous cholecystostomy tube placed 03/11/2023.  Tube removed 04/24/2023.  Disposition: April [name] appears stable.  She continues irinotecan /Panitumumab  on a 3-week schedule.  She presents today prior to proceeding with the next cycle and reports sudden onset of chest pain and shortness of breath 2 days ago.  We referred her for  a chest CT-no PE.  The chest tightness improved while she was in the office.  She continues to to have pain with deep inspiration.  Her performance status seems to be declining.  CEA is higher.  We decided to place the current treatment on hold.  We reviewed the CTs from 04/24/2023 and 03/24/2023.  The peritoneal carcinomatosis appears mildly increased.  Plan for PET scan to restage.  She will receive IV fluids today.  She will return for follow-up in 2 weeks.  Patient  seen with Dr. Scherrie Curt.  Diana Forster ANP/GNP-BC   05/28/2023  9:58 AM  This was a shared visit with Diana Forster.  April [name] was interviewed and examined.  We reviewed the chest CT findings and images with her.  We also reviewed previous abdomen CT images.  She presents with chest pain and dyspnea of unclear etiology.  Her performance status has declined over the past month and the CEA is higher.  It is difficult to appreciate measurable disease on the CT images.  She will be referred for a restaging PET.  I was present for greater than 50% of today's visit.  I performed Medical Decision Making.  April Kerns, MD

## 2023-05-28 NOTE — Telephone Encounter (Signed)
 Patient has been scheduled for follow-up visit per 05/28/23 LOS.  Pt aware of scheduled appt details.

## 2023-05-28 NOTE — Patient Instructions (Signed)

## 2023-05-29 ENCOUNTER — Telehealth: Payer: Self-pay

## 2023-05-29 NOTE — Telephone Encounter (Signed)
 I contacted the patient to confirm her appointment on Jun 04, 2023, at 3:00 PM at [facility]. I advised her to have breakfast before 9:00 AM; after 9:00 AM, she should remain NPO. The patient acknowledged the instructions verbally and had no additional questions.

## 2023-06-04 ENCOUNTER — Other Ambulatory Visit: Payer: Self-pay

## 2023-06-04 ENCOUNTER — Inpatient Hospital Stay

## 2023-06-04 ENCOUNTER — Ambulatory Visit: Payer: Self-pay | Admitting: Oncology

## 2023-06-04 ENCOUNTER — Ambulatory Visit (HOSPITAL_COMMUNITY)
Admission: RE | Admit: 2023-06-04 | Discharge: 2023-06-04 | Disposition: A | Discharge status: Home / Self Care | Source: Ambulatory Visit | Attending: Nurse Practitioner | Admitting: Nurse Practitioner

## 2023-06-04 DIAGNOSIS — R0600 Dyspnea, unspecified: Secondary | ICD-10-CM | POA: Diagnosis not present

## 2023-06-04 DIAGNOSIS — C19 Malignant neoplasm of rectosigmoid junction: Secondary | ICD-10-CM

## 2023-06-04 DIAGNOSIS — Z95828 Presence of other vascular implants and grafts: Secondary | ICD-10-CM

## 2023-06-04 LAB — CMP (CANCER CENTER ONLY)
ALT: 35 U/L (ref 0–44)
AST: 55 U/L — ABNORMAL HIGH (ref 15–41)
Albumin: 3.8 g/dL (ref 3.5–5.0)
Alkaline Phosphatase: 777 U/L — ABNORMAL HIGH (ref 38–126)
Anion gap: 8 (ref 5–15)
BUN: 7 mg/dL (ref 6–20)
CO2: 37 mmol/L — ABNORMAL HIGH (ref 22–32)
Calcium: 9.4 mg/dL (ref 8.9–10.3)
Chloride: 93 mmol/L — ABNORMAL LOW (ref 98–111)
Creatinine: 0.43 mg/dL — ABNORMAL LOW (ref 0.44–1.00)
GFR, Estimated: >60 mL/min (ref >60)
Glucose, Bld: 88 mg/dL (ref 70–99)
Potassium: 2.8 mmol/L — ABNORMAL LOW (ref 3.5–5.1)
Sodium: 138 mmol/L (ref 135–145)
Total Bilirubin: 0.7 mg/dL (ref 0.0–1.2)
Total Protein: 7.6 g/dL (ref 6.5–8.1)

## 2023-06-04 LAB — CBC WITH DIFFERENTIAL (CANCER CENTER ONLY)
Abs Immature Granulocytes: 0.01 10*3/uL (ref 0.00–0.07)
Basophils Absolute: 0.1 10*3/uL (ref 0.0–0.1)
Basophils Relative: 1 %
Eosinophils Absolute: 0.3 10*3/uL (ref 0.0–0.5)
Eosinophils Relative: 3 %
HCT: 33.9 % — ABNORMAL LOW (ref 36.0–46.0)
Hemoglobin: 11.3 g/dL — ABNORMAL LOW (ref 12.0–15.0)
Immature Granulocytes: 0 %
Lymphocytes Relative: 30 %
Lymphs Abs: 2.8 10*3/uL (ref 0.7–4.0)
MCH: 28.5 pg (ref 26.0–34.0)
MCHC: 33.3 g/dL (ref 30.0–36.0)
MCV: 85.6 fL (ref 80.0–100.0)
Monocytes Absolute: 0.9 10*3/uL (ref 0.1–1.0)
Monocytes Relative: 9 %
Neutro Abs: 5.3 10*3/uL (ref 1.7–7.7)
Neutrophils Relative %: 57 %
Platelet Count: 331 10*3/uL (ref 150–400)
RBC: 3.96 MIL/uL (ref 3.87–5.11)
RDW: 15.7 % — ABNORMAL HIGH (ref 11.5–15.5)
WBC Count: 9.3 10*3/uL (ref 4.0–10.5)
nRBC: 0 % (ref 0.0–0.2)

## 2023-06-04 LAB — GLUCOSE, CAPILLARY: Glucose-Capillary: 89 mg/dL (ref 70–99)

## 2023-06-04 LAB — CEA (ACCESS): CEA (CHCC): 39.74 ng/mL — ABNORMAL HIGH (ref 0.00–5.00)

## 2023-06-04 MED ORDER — FLUDEOXYGLUCOSE F - 18 (FDG) INJECTION
6.7000 | Freq: Once | INTRAVENOUS | Status: AC
  Administered 2023-06-04: 6.57 via INTRAVENOUS

## 2023-06-04 MED ORDER — SODIUM CHLORIDE 0.9% FLUSH
10.0000 mL | Freq: Once | INTRAVENOUS | Status: AC
  Administered 2023-06-04: 10 mL

## 2023-06-04 NOTE — Telephone Encounter (Signed)
-----   Message from Coni Deep sent at 06/04/2023  3:38 PM EDT ----- Please call patient, the potassium is low, be sure she is taking potassium, increase the potassium to 20 mEq 3 times per day if she has been taking it, add a CMP and magnesium  next visit

## 2023-06-04 NOTE — Telephone Encounter (Signed)
 Patient gave verbal understanding and had no further questions at this time.

## 2023-06-05 ENCOUNTER — Other Ambulatory Visit: Payer: Self-pay

## 2023-06-05 ENCOUNTER — Other Ambulatory Visit: Payer: Self-pay | Admitting: Nurse Practitioner

## 2023-06-05 ENCOUNTER — Other Ambulatory Visit (HOSPITAL_BASED_OUTPATIENT_CLINIC_OR_DEPARTMENT_OTHER): Payer: Self-pay

## 2023-06-05 ENCOUNTER — Other Ambulatory Visit: Payer: Self-pay | Admitting: *Deleted

## 2023-06-05 DIAGNOSIS — C19 Malignant neoplasm of rectosigmoid junction: Secondary | ICD-10-CM

## 2023-06-05 MED ORDER — HYDROMORPHONE HCL 4 MG PO TABS
4.0000 mg | ORAL_TABLET | ORAL | 0 refills | Status: DC | PRN
Start: 2023-06-05 — End: 2023-06-19
  Filled 2023-06-05 – 2023-06-09 (×2): qty 100, 13d supply, fill #0

## 2023-06-05 MED ORDER — POTASSIUM CHLORIDE ER 10 MEQ PO CPCR: 20.0000 meq | ORAL_CAPSULE | Freq: Two times a day (BID) | ORAL | 2 refills | Status: DC

## 2023-06-05 NOTE — Telephone Encounter (Signed)
 LVM requesting refill on her MicroK and hydromorphone , both to [facility] on [address]. Called [facility] and the do not have hydromorphone  4 mg tablets. Informed her it will need to go to [facility]. She asks that pharmacy be aware she will not be able to pick up medication till 5/29 unless she finds transportation sooner.

## 2023-06-07 ENCOUNTER — Other Ambulatory Visit: Payer: Self-pay

## 2023-06-07 ENCOUNTER — Emergency Department (HOSPITAL_COMMUNITY)

## 2023-06-07 ENCOUNTER — Inpatient Hospital Stay (HOSPITAL_COMMUNITY)
Admission: EM | Admit: 2023-06-07 | Discharge: 2023-06-09 | DRG: 947 | Disposition: A | Discharge status: Home / Self Care | Attending: Family Medicine | Admitting: Family Medicine

## 2023-06-07 ENCOUNTER — Encounter (HOSPITAL_COMMUNITY): Payer: Self-pay | Admitting: Emergency Medicine

## 2023-06-07 DIAGNOSIS — J439 Emphysema, unspecified: Secondary | ICD-10-CM | POA: Diagnosis present

## 2023-06-07 DIAGNOSIS — Z79899 Other long term (current) drug therapy: Secondary | ICD-10-CM

## 2023-06-07 DIAGNOSIS — Z8249 Family history of ischemic heart disease and other diseases of the circulatory system: Secondary | ICD-10-CM

## 2023-06-07 DIAGNOSIS — C182 Malignant neoplasm of ascending colon: Secondary | ICD-10-CM | POA: Diagnosis present

## 2023-06-07 DIAGNOSIS — C19 Malignant neoplasm of rectosigmoid junction: Secondary | ICD-10-CM | POA: Diagnosis present

## 2023-06-07 DIAGNOSIS — C772 Secondary and unspecified malignant neoplasm of intra-abdominal lymph nodes: Secondary | ICD-10-CM | POA: Diagnosis present

## 2023-06-07 DIAGNOSIS — E871 Hypo-osmolality and hyponatremia: Secondary | ICD-10-CM | POA: Diagnosis present

## 2023-06-07 DIAGNOSIS — Z91138 Patient's unintentional underdosing of medication regimen for other reason: Secondary | ICD-10-CM

## 2023-06-07 DIAGNOSIS — K5900 Constipation, unspecified: Secondary | ICD-10-CM | POA: Diagnosis present

## 2023-06-07 DIAGNOSIS — C786 Secondary malignant neoplasm of retroperitoneum and peritoneum: Secondary | ICD-10-CM | POA: Diagnosis present

## 2023-06-07 DIAGNOSIS — R000 Tachycardia, unspecified: Secondary | ICD-10-CM | POA: Diagnosis present

## 2023-06-07 DIAGNOSIS — N200 Calculus of kidney: Secondary | ICD-10-CM | POA: Diagnosis present

## 2023-06-07 DIAGNOSIS — C785 Secondary malignant neoplasm of large intestine and rectum: Secondary | ICD-10-CM | POA: Diagnosis present

## 2023-06-07 DIAGNOSIS — C787 Secondary malignant neoplasm of liver and intrahepatic bile duct: Secondary | ICD-10-CM | POA: Diagnosis present

## 2023-06-07 DIAGNOSIS — I10 Essential (primary) hypertension: Secondary | ICD-10-CM | POA: Diagnosis present

## 2023-06-07 DIAGNOSIS — G893 Neoplasm related pain (acute) (chronic): Principal | ICD-10-CM | POA: Diagnosis present

## 2023-06-07 DIAGNOSIS — R109 Unspecified abdominal pain: Secondary | ICD-10-CM | POA: Diagnosis present

## 2023-06-07 DIAGNOSIS — I714 Abdominal aortic aneurysm, without rupture, unspecified: Secondary | ICD-10-CM | POA: Diagnosis present

## 2023-06-07 DIAGNOSIS — Z6822 Body mass index (BMI) 22.0-22.9, adult: Secondary | ICD-10-CM

## 2023-06-07 DIAGNOSIS — Z95828 Presence of other vascular implants and grafts: Secondary | ICD-10-CM

## 2023-06-07 DIAGNOSIS — Z823 Family history of stroke: Secondary | ICD-10-CM

## 2023-06-07 DIAGNOSIS — E876 Hypokalemia: Secondary | ICD-10-CM | POA: Diagnosis present

## 2023-06-07 DIAGNOSIS — R112 Nausea with vomiting, unspecified: Secondary | ICD-10-CM | POA: Diagnosis present

## 2023-06-07 DIAGNOSIS — R1084 Generalized abdominal pain: Principal | ICD-10-CM

## 2023-06-07 DIAGNOSIS — R9431 Abnormal electrocardiogram [ECG] [EKG]: Secondary | ICD-10-CM | POA: Diagnosis present

## 2023-06-07 DIAGNOSIS — C189 Malignant neoplasm of colon, unspecified: Secondary | ICD-10-CM | POA: Diagnosis present

## 2023-06-07 DIAGNOSIS — Z87891 Personal history of nicotine dependence: Secondary | ICD-10-CM

## 2023-06-07 DIAGNOSIS — R1010 Upper abdominal pain, unspecified: Secondary | ICD-10-CM | POA: Diagnosis present

## 2023-06-07 DIAGNOSIS — E43 Unspecified severe protein-calorie malnutrition: Secondary | ICD-10-CM | POA: Diagnosis present

## 2023-06-07 DIAGNOSIS — Z833 Family history of diabetes mellitus: Secondary | ICD-10-CM

## 2023-06-07 DIAGNOSIS — Z87442 Personal history of urinary calculi: Secondary | ICD-10-CM

## 2023-06-07 LAB — COMPREHENSIVE METABOLIC PANEL WITH GFR
ALT: 42 U/L (ref 0–44)
AST: 74 U/L — ABNORMAL HIGH (ref 15–41)
Albumin: 3.5 g/dL (ref 3.5–5.0)
Alkaline Phosphatase: 797 U/L — ABNORMAL HIGH (ref 38–126)
Anion gap: 12 (ref 5–15)
BUN: <5 mg/dL — ABNORMAL LOW (ref 6–20)
CO2: 31 mmol/L (ref 22–32)
Calcium: 9.2 mg/dL (ref 8.9–10.3)
Chloride: 91 mmol/L — ABNORMAL LOW (ref 98–111)
Creatinine, Ser: 0.31 mg/dL — ABNORMAL LOW (ref 0.44–1.00)
GFR, Estimated: >60 mL/min (ref >60)
Glucose, Bld: 103 mg/dL — ABNORMAL HIGH (ref 70–99)
Potassium: 3 mmol/L — ABNORMAL LOW (ref 3.5–5.1)
Sodium: 134 mmol/L — ABNORMAL LOW (ref 135–145)
Total Bilirubin: 1 mg/dL (ref 0.0–1.2)
Total Protein: 7.8 g/dL (ref 6.5–8.1)

## 2023-06-07 LAB — URINALYSIS, ROUTINE W REFLEX MICROSCOPIC
Bilirubin Urine: NEGATIVE
Glucose, UA: NEGATIVE mg/dL
Ketones, ur: NEGATIVE mg/dL
Leukocytes,Ua: NEGATIVE
Nitrite: NEGATIVE
Protein, ur: NEGATIVE mg/dL
RBC / HPF: >50 RBC/hpf (ref 0–5)
Specific Gravity, Urine: 1.01 (ref 1.005–1.030)
pH: 8 (ref 5.0–8.0)

## 2023-06-07 LAB — CBC WITH DIFFERENTIAL/PLATELET
Abs Immature Granulocytes: 0.02 10*3/uL (ref 0.00–0.07)
Basophils Absolute: 0.1 10*3/uL (ref 0.0–0.1)
Basophils Relative: 1 %
Eosinophils Absolute: 0.5 10*3/uL (ref 0.0–0.5)
Eosinophils Relative: 6 %
HCT: 37 % (ref 36.0–46.0)
Hemoglobin: 12 g/dL (ref 12.0–15.0)
Immature Granulocytes: 0 %
Lymphocytes Relative: 34 %
Lymphs Abs: 2.5 10*3/uL (ref 0.7–4.0)
MCH: 29 pg (ref 26.0–34.0)
MCHC: 32.4 g/dL (ref 30.0–36.0)
MCV: 89.4 fL (ref 80.0–100.0)
Monocytes Absolute: 0.6 10*3/uL (ref 0.1–1.0)
Monocytes Relative: 8 %
Neutro Abs: 3.8 10*3/uL (ref 1.7–7.7)
Neutrophils Relative %: 51 %
Platelets: 362 10*3/uL (ref 150–400)
RBC: 4.14 MIL/uL (ref 3.87–5.11)
RDW: 16 % — ABNORMAL HIGH (ref 11.5–15.5)
WBC: 7.5 10*3/uL (ref 4.0–10.5)
nRBC: 0 % (ref 0.0–0.2)

## 2023-06-07 LAB — I-STAT CG4 LACTIC ACID, ED: Lactic Acid, Venous: 0.7 mmol/L (ref 0.5–1.9)

## 2023-06-07 LAB — MAGNESIUM: Magnesium: 2.1 mg/dL (ref 1.7–2.4)

## 2023-06-07 LAB — LIPASE, BLOOD: Lipase: 28 U/L (ref 11–51)

## 2023-06-07 MED ORDER — SODIUM CHLORIDE 0.9 % IV SOLN: INTRAVENOUS | Status: DC

## 2023-06-07 MED ORDER — LEVALBUTEROL HCL 0.63 MG/3ML IN NEBU: 0.6300 mg | INHALATION_SOLUTION | Freq: Four times a day (QID) | RESPIRATORY_TRACT | Status: DC | PRN

## 2023-06-07 MED ORDER — CLONIDINE HCL 0.1 MG PO TABS
0.1000 mg | ORAL_TABLET | Freq: Once | ORAL | Status: AC
  Administered 2023-06-07: 0.1 mg via ORAL
  Filled 2023-06-07: qty 1

## 2023-06-07 MED ORDER — SODIUM CHLORIDE 0.9 % IV SOLN: INTRAVENOUS | Status: AC

## 2023-06-07 MED ORDER — DEXAMETHASONE 4 MG PO TABS: 4.0000 mg | ORAL_TABLET | Freq: Two times a day (BID) | ORAL | Status: DC

## 2023-06-07 MED ORDER — HYDROMORPHONE HCL 1 MG/ML IJ SOLN
1.0000 mg | Freq: Once | INTRAMUSCULAR | Status: AC
  Administered 2023-06-07: 1 mg via INTRAVENOUS
  Filled 2023-06-07: qty 1

## 2023-06-07 MED ORDER — POTASSIUM CHLORIDE 10 MEQ/100ML IV SOLN
10.0000 meq | INTRAVENOUS | Status: AC
Start: 2023-06-07 — End: 2023-06-07
  Administered 2023-06-07 (×6): 10 meq via INTRAVENOUS
  Filled 2023-06-07 (×7): qty 100

## 2023-06-07 MED ORDER — FLEET ENEMA RE ENEM: 1.0000 | ENEMA | Freq: Once | RECTAL | Status: DC | PRN

## 2023-06-07 MED ORDER — LACTATED RINGERS IV BOLUS
1000.0000 mL | Freq: Once | INTRAVENOUS | Status: AC
  Administered 2023-06-07: 1000 mL via INTRAVENOUS

## 2023-06-07 MED ORDER — DOCUSATE SODIUM 100 MG PO CAPS
200.0000 mg | ORAL_CAPSULE | Freq: Every day | ORAL | Status: DC
  Administered 2023-06-07 – 2023-06-08 (×2): 200 mg via ORAL
  Filled 2023-06-07 (×2): qty 2

## 2023-06-07 MED ORDER — ACETAMINOPHEN 325 MG PO TABS: 650.0000 mg | ORAL_TABLET | Freq: Four times a day (QID) | ORAL | Status: DC | PRN

## 2023-06-07 MED ORDER — POTASSIUM CHLORIDE CRYS ER 20 MEQ PO TBCR: 20.0000 meq | EXTENDED_RELEASE_TABLET | Freq: Every day | ORAL | Status: DC

## 2023-06-07 MED ORDER — OXYCODONE HCL ER 15 MG PO T12A
15.0000 mg | EXTENDED_RELEASE_TABLET | Freq: Two times a day (BID) | ORAL | Status: DC
  Administered 2023-06-07: 15 mg via ORAL
  Filled 2023-06-07: qty 1

## 2023-06-07 MED ORDER — IOHEXOL 300 MG/ML  SOLN
100.0000 mL | Freq: Once | INTRAMUSCULAR | Status: AC | PRN
  Administered 2023-06-07: 100 mL via INTRAVENOUS

## 2023-06-07 MED ORDER — ALUM & MAG HYDROXIDE-SIMETH 200-200-20 MG/5ML PO SUSP
30.0000 mL | Freq: Four times a day (QID) | ORAL | Status: DC | PRN
  Administered 2023-06-08: 30 mL via ORAL
  Filled 2023-06-07: qty 30

## 2023-06-07 MED ORDER — METOCLOPRAMIDE HCL 5 MG/ML IJ SOLN
10.0000 mg | Freq: Once | INTRAMUSCULAR | Status: AC
  Administered 2023-06-07: 10 mg via INTRAVENOUS
  Filled 2023-06-07: qty 2

## 2023-06-07 MED ORDER — ONDANSETRON HCL 4 MG/2ML IJ SOLN
4.0000 mg | Freq: Once | INTRAMUSCULAR | Status: AC
  Administered 2023-06-07: 4 mg via INTRAVENOUS
  Filled 2023-06-07: qty 2

## 2023-06-07 MED ORDER — BISACODYL 5 MG PO TBEC: 5.0000 mg | DELAYED_RELEASE_TABLET | Freq: Every day | ORAL | Status: DC | PRN

## 2023-06-07 MED ORDER — ONDANSETRON HCL 4 MG PO TABS: 4.0000 mg | ORAL_TABLET | Freq: Four times a day (QID) | ORAL | Status: DC | PRN

## 2023-06-07 MED ORDER — IPRATROPIUM BROMIDE 0.02 % IN SOLN: 0.5000 mg | Freq: Four times a day (QID) | RESPIRATORY_TRACT | Status: DC | PRN

## 2023-06-07 MED ORDER — SODIUM CHLORIDE 0.9% FLUSH
3.0000 mL | Freq: Two times a day (BID) | INTRAVENOUS | Status: DC
  Administered 2023-06-07 – 2023-06-08 (×4): 3 mL via INTRAVENOUS

## 2023-06-07 MED ORDER — SODIUM CHLORIDE 0.9% FLUSH: 10.0000 mL | Freq: Two times a day (BID) | INTRAVENOUS | Status: DC

## 2023-06-07 MED ORDER — HYDRALAZINE HCL 20 MG/ML IJ SOLN
10.0000 mg | INTRAMUSCULAR | Status: DC | PRN
  Administered 2023-06-08 (×2): 10 mg via INTRAVENOUS
  Filled 2023-06-07 (×2): qty 1

## 2023-06-07 MED ORDER — SODIUM CHLORIDE 0.9% FLUSH: 10.0000 mL | INTRAVENOUS | Status: DC | PRN

## 2023-06-07 MED ORDER — PANTOPRAZOLE SODIUM 40 MG PO TBEC
40.0000 mg | DELAYED_RELEASE_TABLET | Freq: Every day | ORAL | Status: DC
  Administered 2023-06-08: 40 mg via ORAL
  Filled 2023-06-07: qty 1

## 2023-06-07 MED ORDER — HYDROMORPHONE HCL 2 MG PO TABS: 4.0000 mg | ORAL_TABLET | ORAL | Status: DC | PRN

## 2023-06-07 MED ORDER — MAGNESIUM SULFATE 2 GM/50ML IV SOLN
2.0000 g | Freq: Once | INTRAVENOUS | Status: AC
  Administered 2023-06-07: 2 g via INTRAVENOUS
  Filled 2023-06-07: qty 50

## 2023-06-07 MED ORDER — ALUM & MAG HYDROXIDE-SIMETH 200-200-20 MG/5ML PO SUSP
30.0000 mL | Freq: Once | ORAL | Status: AC
  Administered 2023-06-07: 30 mL via ORAL
  Filled 2023-06-07: qty 30

## 2023-06-07 MED ORDER — HYDROMORPHONE HCL 4 MG PO TABS
4.0000 mg | ORAL_TABLET | ORAL | Status: DC | PRN
  Administered 2023-06-07 – 2023-06-09 (×9): 4 mg via ORAL
  Filled 2023-06-07 (×9): qty 1

## 2023-06-07 MED ORDER — CLONIDINE HCL 0.3 MG/24HR TD PTWK: 0.3000 mg | MEDICATED_PATCH | TRANSDERMAL | Status: DC

## 2023-06-07 MED ORDER — PROCHLORPERAZINE MALEATE 10 MG PO TABS: 10.0000 mg | ORAL_TABLET | Freq: Three times a day (TID) | ORAL | Status: DC

## 2023-06-07 MED ORDER — ONDANSETRON HCL 4 MG/2ML IJ SOLN
4.0000 mg | Freq: Four times a day (QID) | INTRAMUSCULAR | Status: DC | PRN
  Administered 2023-06-08: 4 mg via INTRAVENOUS
  Filled 2023-06-07: qty 2

## 2023-06-07 MED ORDER — LIDOCAINE VISCOUS HCL 2 % MT SOLN
15.0000 mL | Freq: Once | OROMUCOSAL | Status: AC
  Administered 2023-06-07: 15 mL via ORAL
  Filled 2023-06-07: qty 15

## 2023-06-07 MED ORDER — SODIUM CHLORIDE 0.9% FLUSH: 3.0000 mL | Freq: Two times a day (BID) | INTRAVENOUS | Status: DC

## 2023-06-07 MED ORDER — TRAZODONE HCL 50 MG PO TABS: 25.0000 mg | ORAL_TABLET | Freq: Every evening | ORAL | Status: DC | PRN

## 2023-06-07 MED ORDER — METOCLOPRAMIDE HCL 10 MG PO TABS
10.0000 mg | ORAL_TABLET | Freq: Four times a day (QID) | ORAL | Status: DC
  Administered 2023-06-07 – 2023-06-08 (×5): 10 mg via ORAL
  Filled 2023-06-07 (×5): qty 1

## 2023-06-07 MED ORDER — CHLORHEXIDINE GLUCONATE CLOTH 2 % EX PADS
6.0000 | MEDICATED_PAD | Freq: Every day | CUTANEOUS | Status: DC
  Administered 2023-06-07 – 2023-06-08 (×2): 6 via TOPICAL

## 2023-06-07 MED ORDER — KETOROLAC TROMETHAMINE 15 MG/ML IJ SOLN
15.0000 mg | Freq: Once | INTRAMUSCULAR | Status: AC
  Administered 2023-06-07: 15 mg via INTRAVENOUS
  Filled 2023-06-07: qty 1

## 2023-06-07 MED ORDER — OXYCODONE HCL 5 MG PO TABS: 5.0000 mg | ORAL_TABLET | ORAL | Status: DC | PRN

## 2023-06-07 MED ORDER — METOPROLOL TARTRATE 25 MG PO TABS
50.0000 mg | ORAL_TABLET | Freq: Two times a day (BID) | ORAL | Status: DC
  Administered 2023-06-07: 50 mg via ORAL
  Filled 2023-06-07: qty 2

## 2023-06-07 MED ORDER — HEPARIN SODIUM (PORCINE) 5000 UNIT/ML IJ SOLN
5000.0000 [IU] | Freq: Three times a day (TID) | INTRAMUSCULAR | Status: DC
  Administered 2023-06-08: 5000 [IU] via SUBCUTANEOUS
  Filled 2023-06-07 (×4): qty 1

## 2023-06-07 MED ORDER — HYDROMORPHONE HCL 1 MG/ML IJ SOLN: 1.0000 mg | INTRAMUSCULAR | Status: DC | PRN

## 2023-06-07 MED ORDER — POLYETHYLENE GLYCOL 3350 17 G PO PACK: 17.0000 g | PACK | Freq: Every day | ORAL | Status: DC

## 2023-06-07 MED ORDER — AMLODIPINE BESYLATE 5 MG PO TABS
10.0000 mg | ORAL_TABLET | Freq: Every day | ORAL | Status: DC
  Administered 2023-06-08: 10 mg via ORAL
  Filled 2023-06-07: qty 2

## 2023-06-07 MED ORDER — SIMETHICONE 80 MG PO CHEW
80.0000 mg | CHEWABLE_TABLET | Freq: Once | ORAL | Status: AC
  Administered 2023-06-07: 80 mg via ORAL
  Filled 2023-06-07: qty 1

## 2023-06-07 MED ORDER — SENNOSIDES-DOCUSATE SODIUM 8.6-50 MG PO TABS: 1.0000 | ORAL_TABLET | Freq: Every evening | ORAL | Status: DC | PRN

## 2023-06-07 MED ORDER — ACETAMINOPHEN 650 MG RE SUPP: 650.0000 mg | Freq: Four times a day (QID) | RECTAL | Status: DC | PRN

## 2023-06-07 MED ORDER — AMLODIPINE BESYLATE 5 MG PO TABS
10.0000 mg | ORAL_TABLET | Freq: Once | ORAL | Status: AC
  Administered 2023-06-07: 10 mg via ORAL
  Filled 2023-06-07: qty 2

## 2023-06-07 MED ORDER — CLONIDINE HCL 0.3 MG/24HR TD PTWK
0.3000 mg | MEDICATED_PATCH | Freq: Once | TRANSDERMAL | Status: DC
  Administered 2023-06-07: 0.3 mg via TRANSDERMAL
  Filled 2023-06-07 (×2): qty 1

## 2023-06-07 NOTE — H&P (Signed)
 History and Physical   Patient: April [name]                            PCP: Annella Kief, NP                    DOB: 11/08/1975            DOA: 06/07/2023 [id number]             DOS: 06/07/2023, 4:34 PM  Early, Adriane Albe, NP  Patient coming from:   HOME  I have personally reviewed patient's medical records, in electronic medical records, including:  [place] link, and care everywhere.    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 Scherrie Curt, every 3 week with  irinotecan  and panitumumab .  Last infusion was 10 days ago. Laso HTN, AAA, nephrolithiasis, COPD, constipation, chronic abdominal pain, managed with oral Dilaudid  at home Which she has ran out.    Presenting once again with intractable abdominal pain x 3 days, states unable to carry her ADLs due to extensive pain, ran out of her medications days ago.  Her symptoms now also associated with nausea vomiting but no diarrhea.  Note: Patient had a hospitalization on 03/11/2023 for cholecystitis was not a candidate for cholecystectomy, cholecystostomy tube was placed on 226 and removed on 4/11.  ED evaluation/course: Blood pressure (!) 188/140, pulse 93, temperature 97.9 F RR 16, height 5\' 4"  (1.626 m), weight 59.9 kg, last menstrual period 01/07/2006, SpO2 100%.  Labs: CBC within normal limits, CMP sodium 134, potassium 3.0, chloride 91, creatinine 0.31, glucose 103, lactic acid 0.7, magnesium  2.1.   Requested for patient to be admitted for acute on chronic abdominal pain due to metastatic colon cancer.  Associated with poor p.o. intake, nausea and vomiting.     Patient Denies having: Fever, Chills, Cough, SOB, Chest Pain, headache,  dizziness, lightheadedness,  Dysuria, Joint pain, rash, open wounds     Review of Systems: As per HPI, otherwise 10 point review of systems were negative.   ----------------------------------------------------------------------------------------------------------------------  Allergies  Allergen Reactions   Lisinopril Swelling and Other (See Comments)    Lip swelling and Angioedema   Irinotecan  Other (See Comments)    Stomach cramping Patient given 0.5 mL atropine  IV and was able to continue infusion.  See Progress note on 10/03/20.    Leucovorin  Calcium  Itching    Patient reported itchy palms towards end of transfusion, see progress note 08/04/22.    Oxaliplatin  Itching    Patient reported itchy palms towards end of transfusion, see progress note on 08/04/22.    Home MEDs:  Prior to Admission medications   Medication Sig Start Date End Date Taking? Authorizing Provider  amLODipine  (NORVASC ) 10 MG tablet Take 1 tablet (10 mg total) by mouth daily. 11/24/22   Armenta Landau, MD  cloNIDine  (CATAPRES ) 0.1 MG tablet Take 1 tablet (0.1 mg total) by mouth 3 (three) times daily. 03/26/23   Faith Homes, MD  dexamethasone  (DECADRON ) 4 MG tablet Take 1 tablet by mouth twice a day for 3 days beginning on day 2 of chemo. 04/16/23   Sumner Ends, MD  dicyclomine  (  BENTYL ) 20 MG tablet Take 1 tablet (20 mg total) by mouth 3 (three) times daily as needed for spasms (rectal/AB pain). 02/17/23   Sumner Ends, MD  HYDROmorphone  (DILAUDID ) 4 MG tablet Take 1-2 tablets (4-8 mg total) by mouth every 4 (four) hours as needed for severe pain (pain score 7-10) or moderate pain (pain score 4-6). 06/05/23   Roseline Conine, NP  lactulose  (CHRONULAC ) 10 GM/15ML solution Take 15-30 mLs (10-20 g total) by mouth daily as needed for mild constipation. Patient not taking: Reported on 05/28/2023 04/02/23   Roseline Conine, NP  linaclotide  (LINZESS ) 72 MCG capsule Take 72 mcg by mouth daily as needed  (constipation). Patient not taking: Reported on 03/17/2023    [provider]  metoCLOPramide  (REGLAN ) 10 MG tablet Take 1 tablet (10 mg total) by mouth 4 (four) times daily. 05/28/23   Thomas, Lisa K, NP  metoprolol  tartrate (LOPRESSOR ) 50 MG tablet Take 1 tablet (50 mg total) by mouth 2 (two) times daily. 11/24/22   Armenta Landau, MD  pantoprazole  (PROTONIX ) 40 MG tablet Take 1 tablet (40 mg total) by mouth daily before breakfast. 02/27/23   Ozell Blunt, MD  polyethylene glycol (MIRALAX  / GLYCOLAX ) 17 g packet Take 17 g by mouth daily as needed. 02/27/23   Ozell Blunt, MD  potassium chloride  (MICRO-K ) 10 MEQ CR capsule Take 2 capsules (20 mEq total) by mouth 2 (two) times daily. 06/05/23   Sumner Ends, MD  prochlorperazine  (COMPAZINE ) 10 MG tablet Take 1 tablet (10 mg total) by mouth every 6 (six) hours as needed for nausea or vomiting. 02/03/23   Roseline Conine, NP  Sodium Chloride  Flush (NORMAL SALINE FLUSH) 0.9 % SOLN Use 5 mLs by Intracatheter route daily in biliary drain at 6 (six) AM as directed 03/14/23   [place], Eric J, DO    PRN MEDs: acetaminophen  **OR** acetaminophen , alum & mag hydroxide-simeth, bisacodyl , hydrALAZINE , HYDROmorphone  (DILAUDID ) injection, HYDROmorphone , ipratropium, levalbuterol, ondansetron  **OR** ondansetron  (ZOFRAN ) IV, senna-docusate, sodium chloride  flush, sodium phosphate , traZODone  Past Medical History:  Diagnosis Date   Ascending aortic aneurysm (HCC) 12/16/2022   Benign essential hypertension 10/06/2017   Last Assessment & Plan:  Formatting of this note might be different from the original. Patient's blood pressure noted to be elevated at today's visit 188/98.  Patient will be provided with antihypertensives in the treatment center if needed in order to meet her Avastin  parameters.  Patient recommended to follow up with her primary care physician regarding medication management.  Patient is not cur   Cancer [facility])    Change in bowel habits  06/28/2022   Colon cancer (HCC)    History of colon cancer 06/28/2022   Hypertension    Prolonged QT interval 01/07/2018   Renal calculi    bilateral   Sepsis due to undetermined organism (HCC) 12/24/2022   Sialoadenitis of submandibular gland 12/16/2022    Past Surgical History:  Procedure Laterality Date   BALLOON DILATION N/A 02/26/2023   Procedure: BALLOON DILATION;  Surgeon: Normie Becton., MD;  Location: [facility] ENDOSCOPY;  Service: Gastroenterology;  Laterality: N/A;   BILIARY BRUSHING  03/24/2022   Procedure: BILIARY BRUSHING;  Surgeon: Brice Campi Albino Alu., MD;  Location: [facility] ENDOSCOPY;  Service: Gastroenterology;;   BILIARY DILATION  03/24/2022   Procedure: BILIARY DILATION;  Surgeon: Normie Becton., MD;  Location: [facility] ENDOSCOPY;  Service: Gastroenterology;;   BILIARY STENT PLACEMENT  03/24/2022   Procedure: BILIARY STENT PLACEMENT;  Surgeon: Yong Henle  Marieta Shorten., MD;  Location: [facility] ENDOSCOPY;  Service: Gastroenterology;;   BILIARY STENT PLACEMENT N/A 02/26/2023   Procedure: BILIARY STENT PLACEMENT;  Surgeon: Normie Becton., MD;  Location: [facility] ENDOSCOPY;  Service: Gastroenterology;  Laterality: N/A;   BIOPSY  03/24/2022   Procedure: BIOPSY;  Surgeon: Brice Campi Albino Alu., MD;  Location: [facility] ENDOSCOPY;  Service: Gastroenterology;;   BIOPSY  11/23/2022   Procedure: BIOPSY;  Surgeon: Tobin Forts, MD;  Location: [facility] ENDOSCOPY;  Service: Gastroenterology;;   BIOPSY  02/26/2023   Procedure: BIOPSY;  Surgeon: Normie Becton., MD;  Location: [facility] ENDOSCOPY;  Service: Gastroenterology;;   CESAREAN SECTION     x2   COLONOSCOPY N/A 11/23/2022   Procedure: COLONOSCOPY;  Surgeon: Tobin Forts, MD;  Location: [facility] ENDOSCOPY;  Service: Gastroenterology;  Laterality: N/A;   CYSTOSCOPY WITH RETROGRADE PYELOGRAM, URETEROSCOPY AND STENT PLACEMENT Right 10/22/2020   Procedure: CYSTOSCOPY WITH RETROGRADE PYELOGRAM, URETEROSCOPY AND STENT PLACEMENT;  Surgeon: Roxane Copp, MD;  Location: [facility] ORS;  Service: Urology;  Laterality: Right;   ENDOSCOPIC RETROGRADE CHOLANGIOPANCREATOGRAPHY (ERCP) WITH PROPOFOL  N/A 02/26/2023   Procedure: ENDOSCOPIC RETROGRADE CHOLANGIOPANCREATOGRAPHY (ERCP) WITH PROPOFOL ;  Surgeon: Brice Campi Albino Alu., MD;  Location: [facility] ENDOSCOPY;  Service: Gastroenterology;  Laterality: N/A;   ERCP N/A 03/24/2022   Procedure: ENDOSCOPIC RETROGRADE CHOLANGIOPANCREATOGRAPHY (ERCP);  Surgeon: Normie Becton., MD;  Location: [facility] ENDOSCOPY;  Service: Gastroenterology;  Laterality: N/A;   ESOPHAGOGASTRODUODENOSCOPY (EGD) WITH PROPOFOL  N/A 02/26/2023   Procedure: ESOPHAGOGASTRODUODENOSCOPY (EGD) WITH PROPOFOL ;  Surgeon: Brice Campi Albino Alu., MD;  Location: [facility] ENDOSCOPY;  Service: Gastroenterology;  Laterality: N/A;  patient needs EGD in addition to ERCP   IR PATIENT EVAL TECH 0-60 MINS  12/15/2022   IR PERC CHOLECYSTOSTOMY  03/11/2023   IR RADIOLOGIST EVAL & MGMT  09/16/2019   IR RADIOLOGIST EVAL & MGMT  09/23/2019   IR RADIOLOGIST EVAL & MGMT  09/20/2019   IR RADIOLOGIST EVAL & MGMT  09/30/2019   IR RADIOLOGIST EVAL & MGMT  04/20/2023   IR REMOVAL TUN ACCESS W/ PORT W/O FL MOD SED  09/14/2019   IR VENO/EXT/UNI RIGHT  04/11/2022   PANCREATIC STENT PLACEMENT  03/24/2022   Procedure: PANCREATIC STENT PLACEMENT;  Surgeon: Normie Becton., MD;  Location: [facility] ENDOSCOPY;  Service: Gastroenterology;;   REMOVAL OF STONES  03/24/2022   Procedure: REMOVAL OF STONES;  Surgeon: Normie Becton., MD;  Location: [facility] ENDOSCOPY;  Service: Gastroenterology;;   REMOVAL OF STONES  02/26/2023   Procedure: REMOVAL OF Sludge;  Surgeon: Normie Becton., MD;  Location: [facility]OSCOPY;  Service: Gastroenterology;;   [name]  03/24/2022   Procedure: [name];  Surgeon: Normie Becton., MD;  Location: [facility] ENDOSCOPY;  Service: Gastroenterology;;   [name] REMOVAL  02/26/2023   Procedure: STENT REMOVAL;  Surgeon: Normie Becton., MD;   Location: [facility] ENDOSCOPY;  Service: Gastroenterology;;   URETERAL STENT PLACEMENT       reports that she has quit smoking. Her smoking use included cigarettes. She has never used smokeless tobacco. She reports that she does not currently use alcohol. She reports that she does not currently use drugs after having used the following drugs: Marijuana.   Family History  Problem Relation Age of Onset   Hypertension Mother    Diabetes Mellitus II Father    Stroke Father    Ulcers Sister    Colon cancer Neg Hx    Esophageal cancer Neg Hx    Inflammatory bowel disease Neg Hx    Liver disease Neg  Hx    Pancreatic cancer Neg Hx    Rectal cancer Neg Hx    Stomach cancer Neg Hx     Physical Exam:   Vitals:   06/07/23 1400 06/07/23 1540 06/07/23 1557 06/07/23 1627  BP: (!) 188/140 (!) 183/126  (!) 157/126  Pulse: 93 85  89  Resp: 16 16  20   Temp:   97.6 F (36.4 C) 98.3 F (36.8 C)  TempSrc:   Oral Oral  SpO2: 100% 99%  98%  Weight:      Height:       Constitutional: NAD, calm, comfortable Eyes: PERRL, lids and conjunctivae normal ENMT: Mucous membranes are moist. Posterior pharynx clear of any exudate or lesions.Normal dentition.  Neck: normal, supple, no masses, no thyromegaly Respiratory: clear to auscultation bilaterally, no wheezing, no crackles. Normal respiratory effort. No accessory muscle use.  Cardiovascular: Regular rate and rhythm, no murmurs / rubs / gallops. No extremity edema. 2+ pedal pulses. No carotid bruits.  Abdomen: no tenderness, no masses palpated. No hepatosplenomegaly. Bowel sounds positive.  Musculoskeletal: no clubbing / cyanosis. No joint deformity upper and lower extremities. Good ROM, no contractures. Normal muscle tone.  Neurologic: CN II-XII grossly intact. Sensation intact, DTR normal. Strength 5/5 in all 4.  Psychiatric: Normal judgment and insight. Alert and oriented x 3. Normal mood.  Skin: no rashes, lesions, ulcers. No induration Wounds: per  nursing documentation         Labs on admission:    I have personally reviewed following labs and imaging studies  CBC: Recent Labs  Lab 06/04/23 1403 06/07/23 0814  WBC 9.3 7.5  NEUTROABS 5.3 3.8  HGB 11.3* 12.0  HCT 33.9* 37.0  MCV 85.6 89.4  PLT 331 362   Basic Metabolic Panel: Recent Labs  Lab 06/04/23 1403 06/07/23 0814  NA 138 134*  K 2.8* 3.0*  CL 93* 91*  CO2 37* 31  GLUCOSE 88 103*  BUN 7 <5*  CREATININE 0.43* 0.31*  CALCIUM  9.4 9.2  MG  --  2.1   GFR: Estimated Creatinine Clearance: 73.5 mL/min (A) (by C-G formula based on SCr of 0.31 mg/dL (L)). Liver Function Tests: Recent Labs  Lab 06/04/23 1403 06/07/23 0814  AST 55* 74*  ALT 35 42  ALKPHOS 777* 797*  BILITOT 0.7 1.0  PROT 7.6 7.8  ALBUMIN 3.8 3.5   Recent Labs  Lab 06/07/23 0814  LIPASE 28    CBG: Recent Labs  Lab 06/04/23 1508  GLUCAP 89    Urine analysis:    Component Value Date/Time   COLORURINE YELLOW 06/07/2023 1010   APPEARANCEUR HAZY (A) 06/07/2023 1010   LABSPEC 1.010 06/07/2023 1010   PHURINE 8.0 06/07/2023 1010   GLUCOSEU NEGATIVE 06/07/2023 1010   HGBUR MODERATE (A) 06/07/2023 1010   BILIRUBINUR NEGATIVE 06/07/2023 1010   KETONESUR NEGATIVE 06/07/2023 1010   PROTEINUR NEGATIVE 06/07/2023 1010   NITRITE NEGATIVE 06/07/2023 1010   LEUKOCYTESUR NEGATIVE 06/07/2023 1010    Last A1C:  No results found for: "HGBA1C"   Radiologic Exams on Admission:   CT ABDOMEN PELVIS W CONTRAST Result Date: 06/07/2023 CLINICAL DATA:  Abdominal pain for 3 days.  Stage IV liver cancer. EXAM: CT ABDOMEN AND PELVIS WITH CONTRAST TECHNIQUE: Multidetector CT imaging of the abdomen and pelvis was performed using the standard protocol following bolus administration of intravenous contrast. RADIATION DOSE REDUCTION: This exam was performed according to the departmental dose-optimization program which includes automated exposure control, adjustment of the mA and/or kV according  to  patient size and/or use of iterative reconstruction technique. CONTRAST:  OMNIPAQUE  IOHEXOL  300 MG/ML  SOLN COMPARISON:  PET CT from 3 days ago FINDINGS: Lower chest:  No contributory findings. Hepatobiliary: Heterogeneous enhancement the liver with surface distortion attributed to peritoneal disease. This mainly affects the right lobe and could also be explained by asymmetric right intrahepatic biliary dilatation. Biliary stents are in expected position. Percutaneous cholecystostomy tube has been removed since April, no evidence of gallbladder obstruction. Pancreas: Ductal dilatation similar to prior, including both accessory and main ducts which may be related to the stenting. No acute pancreatitis seen. Spleen: Unremarkable. Adrenals/Urinary Tract: Negative adrenals. No hydronephrosis or ureteral stone. Bilateral nephrolithiasis with multiple calculi, largest at the interpolar left kidney measuring 11 mm. Unremarkable bladder. Stomach/Bowel: No bowel obstruction or visible inflammation. Upper rectal wall thickening with regional calcification, fat distortion, and adenopathy. Vascular/Lymphatic: No emergent vascular finding. Enlarged nodes, some calcified, in the IMA distribution reaching the lower aorta. No emergent vascular finding. 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 Reproductive:Cystic density in the low left pelvis could be trapped peritoneal fluid or ovarian, up to 5 cm without worrisome internal complexity. Other: Bulky peritoneal metastatic disease with widespread nodularity most confluent in the ventral right upper quadrant. Musculoskeletal: No acute or aggressive finding. 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 IMPRESSION: Heterogeneous enhancement diffusely affecting the right liver, much of this may be related to asymmetric biliary dilatation in the same distribution. There is also overlying capsular peritoneal disease. Biliary stents remain in normal position.  Extensive peritoneal metastatic disease without bowel obstruction. Ascites remains small volume. 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 Electronically Signed   By: Ronnette Coke M.D.   On: 06/07/2023 10:23    EKG:   Independently reviewed.  Orders placed or performed during the hospital encounter of 06/07/23   ED EKG   ED EKG   EKG 12-Lead   *Note: Due to a large number of results and/or encounters for the requested time period, some results have not been displayed. A complete set of results can be found in Results Review.   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 every 4 hours as needed-adding long -acting oxycodone  - Initiating her antinausea medication of Reglan , as needed Zofran  -Add stool softeners -Titrate above pain regimen for better pain control    Hypertension - Accelerated hypertension - patient ran out of home meds -Will resume home medications of: Norvasc , Clonidine , Lopressor , Adding as needed IV hydralazine   Protein-calorie malnutrition, severe Body mass index is 22.66 kg/m. - Once pain is under control, nausea vomiting improved -Will  consult dietitian, adding dietary supplements  Emphysema/COPD (HCC) - Currently stable, monitoring closely -Do not bronchodilators, supplemental oxygen  Hyponatremia - Due to nausea and vomiting, poor p.o. intake - Sodium  130, gentle IV fluid resuscitation with normal saline - Will monitor sign closely  Intractable nausea and vomiting - Initiating home medication of Reglan  10 mg 4 times daily -As needed Zofran  (If no improvement, anticipating adding promethazine  or Compazine )  Metastatic colon cancer to liver [facility]) under treatment with her oncologist Dr. Scherrie Curt, every 3 week with  irinotecan  and panitumumab .  Last infusion was 10 days ago.   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) Continue treatment with oncologist Pain management  Colorectal cancer, stage IV (HCC) Follow-up treatment with primary oncology  Port-A-Cath in place Noted-elevated for function  Prolonged QT interval Monitoring closely  Hypokalemia - Potassium 3.0, being  - Currently being replaced with IV potassium through ED, will monitor closely, magnesium  at 2.1, also replacing  Sinus tachycardia - Stable continue metoprolol     Consults called:  None -------------------------------------------------------------------------------------------------------------------------------------------- DVT prophylaxis:  heparin  injection 5,000 Units Start: 06/07/23 2200 TED hose Start: 06/07/23 1541 SCDs Start: 06/07/23 1541   Code Status:   Code Status: Full Code   Admission status: Patient will be admitted as Observation, with a greater than 2 midnight length of stay. Level of care: Med-Surg   Family Communication:  none at bedside  (The above findings and plan of care has been discussed with patient in detail, the patient expressed understanding and agreement of above plan)   --------------------------------------------------------------------------------------------------------------------------------------------------  Disposition Plan:  Anticipated 1-2 days Status is: Observation The patient remains OBS appropriate and will d/c before 2 midnights.  ----------------------------------------------------------------------------------------------------------------------------  Time spent:  38  Min.  Was spent seeing and evaluating the patient, reviewing all medical records, drawn plan of care.  SIGNED: Bobbetta Burnet, MD, FHM. FAAFP. [facility], Pager  (Please use [email or web address] to page/ or secure chat through epic) If 7PM-7AM, please contact night-coverage [email or web address],  06/07/2023, 4:34 PM

## 2023-06-07 NOTE — Assessment & Plan Note (Signed)
 Noted-elevated for function

## 2023-06-07 NOTE — Assessment & Plan Note (Signed)
 under treatment with her oncologist Dr. Scherrie Curt, every 3 week with  irinotecan  and panitumumab .  Last infusion was 10 days ago.

## 2023-06-07 NOTE — Assessment & Plan Note (Signed)
 Continue treatment with oncologist Pain management

## 2023-06-07 NOTE — Hospital Course (Addendum)
 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 Scherrie Curt, every 3 week with  irinotecan  and panitumumab .  Last infusion was 10 days ago. Laso HTN, AAA, nephrolithiasis, COPD, constipation, chronic abdominal pain, managed with oral Dilaudid  at home Which she has ran out.    Presenting once again with intractable abdominal pain x 3 days, states unable to carry her ADLs due to extensive pain, ran out of her medications days ago.  Her symptoms now also associated with nausea vomiting but no diarrhea.  Note: Patient had a hospitalization on 03/11/2023 for cholecystitis was not a candidate for cholecystectomy, cholecystostomy tube was placed on 226 and removed on 4/11.  ED evaluation/course: Blood pressure (!) 188/140, pulse 93, temperature 97.9 F RR 16, height 5\' 4"  (1.626 m), weight 59.9 kg, last menstrual period 01/07/2006, SpO2 100%.  Labs: CBC within normal limits, CMP sodium 134, potassium 3.0, chloride 91, creatinine 0.31, glucose 103, lactic acid 0.7, magnesium  2.1.   Requested for patient to be admitted for acute on chronic abdominal pain due to metastatic colon cancer.  Associated with poor p.o. intake, nausea and vomiting.

## 2023-06-07 NOTE — ED Provider Notes (Signed)
 [place] EMERGENCY DEPARTMENT AT [facility] Provider Note   CSN: [id number] Arrival date & time: 06/07/23  0725     History  Chief Complaint  Patient presents with   Abdominal Pain    April [name] is a 48 y.o. female.  HPI Patient presents for abdominal pain.  Medical history includes HTN, metastatic colon cancer, nephrolithiasis, ascending aortic aneurysm, COPD, constipation.  Her oncologist is Dr. Scherrie Curt.  She has a history of peritoneal carcinomatosis with liver mets.  She had a malignant biliary stricture and underwent ERCP with stent on 2/13.  She was admitted later that month for abdominal pain, nausea, and vomiting with concern of cholecystitis.  She was not a surgical candidate at the time.  Cutaneous cholecystostomy tube was placed on 2/26 and removed on 4/11.  She is currently undergoing every 3 week irinotecan  and panitumumab .  Last infusion was 10 days ago.  She has chronic abdominal pain which is treated with Dilaudid .  She ran out of her narcotic pain medication 2 days ago.  She has had worsening pain across her upper abdomen since that time.  She has tried treating it with ibuprofen  without relief.  Yesterday, she developed intractable nausea and vomiting.  She has had p.o. intolerance since that time.  Patient reports small bowel movements, the last which was yesterday.  She does feel that her abdomen is distended more than usual.    Home Medications Prior to Admission medications   Medication Sig Start Date End Date Taking? Authorizing Provider  amLODipine  (NORVASC ) 10 MG tablet Take 1 tablet (10 mg total) by mouth daily. 11/24/22   Armenta Landau, MD  cloNIDine  (CATAPRES ) 0.1 MG tablet Take 1 tablet (0.1 mg total) by mouth 3 (three) times daily. 03/26/23   Faith Homes, MD  dexamethasone  (DECADRON ) 4 MG tablet Take 1 tablet by mouth twice a day for 3 days beginning on day 2 of chemo. 04/16/23   Sumner Ends, MD  dicyclomine  (BENTYL )  20 MG tablet Take 1 tablet (20 mg total) by mouth 3 (three) times daily as needed for spasms (rectal/AB pain). 02/17/23   Sumner Ends, MD  HYDROmorphone  (DILAUDID ) 4 MG tablet Take 1-2 tablets (4-8 mg total) by mouth every 4 (four) hours as needed for severe pain (pain score 7-10) or moderate pain (pain score 4-6). 06/05/23   Roseline Conine, NP  lactulose  (CHRONULAC ) 10 GM/15ML solution Take 15-30 mLs (10-20 g total) by mouth daily as needed for mild constipation. Patient not taking: Reported on 05/28/2023 04/02/23   Roseline Conine, NP  linaclotide  (LINZESS ) 72 MCG capsule Take 72 mcg by mouth daily as needed (constipation). Patient not taking: Reported on 03/17/2023    [provider]  metoCLOPramide  (REGLAN ) 10 MG tablet Take 1 tablet (10 mg total) by mouth 4 (four) times daily. 05/28/23   Thomas, Lisa K, NP  metoprolol  tartrate (LOPRESSOR ) 50 MG tablet Take 1 tablet (50 mg total) by mouth 2 (two) times daily. 11/24/22   Armenta Landau, MD  pantoprazole  (PROTONIX ) 40 MG tablet Take 1 tablet (40 mg total) by mouth daily before breakfast. 02/27/23   Ozell Blunt, MD  polyethylene glycol (MIRALAX  / GLYCOLAX ) 17 g packet Take 17 g by mouth daily as needed. 02/27/23   Ozell Blunt, MD  potassium chloride  (MICRO-K ) 10 MEQ CR capsule Take 2 capsules (20 mEq total) by mouth 2 (two) times daily. 06/05/23   Sumner Ends, MD  prochlorperazine  (COMPAZINE ) 10  MG tablet Take 1 tablet (10 mg total) by mouth every 6 (six) hours as needed for nausea or vomiting. 02/03/23   Roseline Conine, NP  Sodium Chloride  Flush (NORMAL SALINE FLUSH) 0.9 % SOLN Use 5 mLs by Intracatheter route daily in biliary drain at 6 (six) AM as directed 03/14/23   [place], Eric J, DO      Allergies    Lisinopril, Irinotecan , Leucovorin  calcium , and Oxaliplatin     Review of Systems   Review of Systems  Gastrointestinal:  Positive for abdominal distention, abdominal pain, nausea and vomiting.  All other systems reviewed and are  negative.   Physical Exam Updated Vital Signs BP (!) 157/126 (BP Location: Right Arm)   Pulse 89   Temp 98.3 F (36.8 C) (Oral)   Resp 20   Ht 5\' 4"  (1.626 m)   Wt 59.9 kg   LMP 01/07/2006   SpO2 98%   BMI 22.66 kg/m  Physical Exam Vitals and nursing note reviewed.  Constitutional:      General: She is not in acute distress.    Appearance: She is well-developed. She is not toxic-appearing or diaphoretic.  HENT:     Head: Normocephalic and atraumatic.  Eyes:     Conjunctiva/sclera: Conjunctivae normal.  Cardiovascular:     Rate and Rhythm: Normal rate and regular rhythm.  Pulmonary:     Effort: Pulmonary effort is normal. No respiratory distress.  Abdominal:     General: There is distension.     Palpations: Abdomen is soft.     Tenderness: There is generalized abdominal tenderness. There is no guarding or rebound.  Musculoskeletal:        General: No swelling.     Cervical back: Neck supple.  Skin:    General: Skin is warm and dry.     Coloration: Skin is not cyanotic or jaundiced.  Neurological:     General: No focal deficit present.     Mental Status: She is alert and oriented to person, place, and time.  Psychiatric:        Mood and Affect: Mood normal.        Behavior: Behavior normal.     ED Results / Procedures / Treatments   Labs (all labs ordered are listed, but only abnormal results are displayed) Labs Reviewed  COMPREHENSIVE METABOLIC PANEL WITH GFR - Abnormal; Notable for the following components:      Result Value   Sodium 134 (*)    Potassium 3.0 (*)    Chloride 91 (*)    Glucose, Bld 103 (*)    BUN <5 (*)    Creatinine, Ser 0.31 (*)    AST 74 (*)    Alkaline Phosphatase 797 (*)    All other components within normal limits  CBC WITH DIFFERENTIAL/PLATELET - Abnormal; Notable for the following components:   RDW 16.0 (*)    All other components within normal limits  URINALYSIS, ROUTINE W REFLEX MICROSCOPIC - Abnormal; Notable for the  following components:   APPearance HAZY (*)    Hgb urine dipstick MODERATE (*)    Bacteria, UA RARE (*)    Crystals PRESENT (*)    All other components within normal limits  EXPECTORATED SPUTUM ASSESSMENT W GRAM STAIN, RFLX TO RESP C  LIPASE, BLOOD  MAGNESIUM   PHOSPHORUS  I-STAT CHEM 8, ED  I-STAT CG4 LACTIC ACID, ED    EKG EKG Interpretation Date/Time:  Sunday Jun 07 2023 07:59:46 EDT Ventricular Rate:  69 PR Interval:  150 QRS Duration:  97 QT Interval:  437 QTC Calculation: 469 R Axis:   86  Text Interpretation: Sinus rhythm Probable left atrial enlargement Left ventricular hypertrophy Confirmed by Iva Mariner [phone]) on 06/07/2023 2:19:09 PM  Radiology CT ABDOMEN PELVIS W CONTRAST Result Date: 06/07/2023 CLINICAL DATA:  Abdominal pain for 3 days.  Stage IV liver cancer. EXAM: CT ABDOMEN AND PELVIS WITH CONTRAST TECHNIQUE: Multidetector CT imaging of the abdomen and pelvis was performed using the standard protocol following bolus administration of intravenous contrast. RADIATION DOSE REDUCTION: This exam was performed according to the departmental dose-optimization program which includes automated exposure control, adjustment of the mA and/or kV according to patient size and/or use of iterative reconstruction technique. CONTRAST:  OMNIPAQUE  IOHEXOL  300 MG/ML  SOLN COMPARISON:  PET CT from 3 days ago FINDINGS: Lower chest:  No contributory findings. Hepatobiliary: Heterogeneous enhancement the liver with surface distortion attributed to peritoneal disease. This mainly affects the right lobe and could also be explained by asymmetric right intrahepatic biliary dilatation. Biliary stents are in expected position. Percutaneous cholecystostomy tube has been removed since April, no evidence of gallbladder obstruction. Pancreas: Ductal dilatation similar to prior, including both accessory and main ducts which may be related to the stenting. No acute pancreatitis seen. Spleen: Unremarkable.  Adrenals/Urinary Tract: Negative adrenals. No hydronephrosis or ureteral stone. Bilateral nephrolithiasis with multiple calculi, largest at the interpolar left kidney measuring 11 mm. Unremarkable bladder. Stomach/Bowel: No bowel obstruction or visible inflammation. Upper rectal wall thickening with regional calcification, fat distortion, and adenopathy. Vascular/Lymphatic: No emergent vascular finding. Enlarged nodes, some calcified, in the IMA distribution reaching the lower aorta. No emergent vascular finding. 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 Reproductive:Cystic density in the low left pelvis could be trapped peritoneal fluid or ovarian, up to 5 cm without worrisome internal complexity. Other: Bulky peritoneal metastatic disease with widespread nodularity most confluent in the ventral right upper quadrant. Musculoskeletal: No acute or aggressive finding. 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 IMPRESSION: Heterogeneous enhancement diffusely affecting the right liver, much of this may be related to asymmetric biliary dilatation in the same distribution. There is also overlying capsular peritoneal disease. Biliary stents remain in normal position. Extensive peritoneal metastatic disease without bowel obstruction. Ascites remains small volume. 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 Electronically Signed   By: Ronnette Coke M.D.   On: 06/07/2023 10:23    Procedures Procedures    Medications Ordered in ED Medications  cloNIDine  (CATAPRES  - Dosed in mg/24 hr) patch 0.3 mg (0.3 mg Transdermal Patch Applied 06/07/23 0821)  potassium chloride  10 mEq in 100 mL IVPB (0 mEq Intravenous Stopped 06/07/23 1559)  heparin  injection 5,000 Units (has no administration in time range)  sodium chloride  flush (NS) 0.9 % injection 3 mL (has no administration in time range)  acetaminophen  (TYLENOL ) tablet 650 mg (has no administration in time range)    Or  acetaminophen  (TYLENOL )  suppository 650 mg (has no administration in time range)  HYDROmorphone  (DILAUDID ) injection 1 mg (has no administration in time range)  traZODone (DESYREL) tablet 25 mg (has no administration in time range)  senna-docusate (Senokot-S) tablet 1 tablet (has no administration in time range)  bisacodyl  (DULCOLAX) EC tablet 5 mg (has no administration in time range)  sodium phosphate  (FLEET) enema 1 enema (has no administration in time range)  ondansetron  (ZOFRAN ) tablet 4 mg (has no administration in time range)    Or  ondansetron  (ZOFRAN ) injection 4 mg (has no  administration in time range)  ipratropium (ATROVENT) nebulizer solution 0.5 mg (has no administration in time range)  levalbuterol (XOPENEX) nebulizer solution 0.63 mg (has no administration in time range)  hydrALAZINE  (APRESOLINE ) injection 10 mg (has no administration in time range)  magnesium  sulfate IVPB 2 g 50 mL (has no administration in time range)  amLODipine  (NORVASC ) tablet 10 mg (has no administration in time range)  metoprolol  tartrate (LOPRESSOR ) tablet 50 mg (has no administration in time range)  dexamethasone  (DECADRON ) tablet 4 mg (has no administration in time range)  metoCLOPramide  (REGLAN ) tablet 10 mg (has no administration in time range)  pantoprazole  (PROTONIX ) EC tablet 40 mg (has no administration in time range)  polyethylene glycol (MIRALAX  / GLYCOLAX ) packet 17 g (has no administration in time range)  potassium chloride  SA (KLOR-CON  M) CR tablet 20 mEq (has no administration in time range)  prochlorperazine  (COMPAZINE ) tablet 10 mg (has no administration in time range)  oxyCODONE  (OXYCONTIN ) 12 hr tablet 15 mg (has no administration in time range)  0.9 %  sodium chloride  infusion ( Intravenous New Bag/Given 06/07/23 1630)  alum & mag hydroxide-simeth (MAALOX/MYLANTA) 200-200-20 MG/5ML suspension 30 mL (has no administration in time range)  sodium chloride  flush (NS) 0.9 % injection 10-40 mL (has no  administration in time range)  Chlorhexidine  Gluconate Cloth 2 % PADS 6 each (has no administration in time range)  HYDROmorphone  (DILAUDID ) tablet 4 mg (has no administration in time range)  docusate sodium  (COLACE) capsule 200 mg (has no administration in time range)  lactated ringers  bolus 1,000 mL (0 mLs Intravenous Stopped 06/07/23 1007)  HYDROmorphone  (DILAUDID ) injection 1 mg (1 mg Intravenous Given 06/07/23 0814)  metoCLOPramide  (REGLAN ) injection 10 mg (10 mg Intravenous Given 06/07/23 0814)  cloNIDine  (CATAPRES ) tablet 0.1 mg (0.1 mg Oral Given 06/07/23 1013)  amLODipine  (NORVASC ) tablet 10 mg (10 mg Oral Given 06/07/23 1013)  iohexol  (OMNIPAQUE ) 300 MG/ML solution 100 mL (100 mLs Intravenous Contrast Given 06/07/23 0953)  HYDROmorphone  (DILAUDID ) injection 1 mg (1 mg Intravenous Given 06/07/23 1156)  HYDROmorphone  (DILAUDID ) injection 1 mg (1 mg Intravenous Given 06/07/23 1535)  ketorolac  (TORADOL ) 15 MG/ML injection 15 mg (15 mg Intravenous Given 06/07/23 1535)  ondansetron  (ZOFRAN ) injection 4 mg (4 mg Intravenous Given 06/07/23 1535)  alum & mag hydroxide-simeth (MAALOX/MYLANTA) 200-200-20 MG/5ML suspension 30 mL (30 mLs Oral Given 06/07/23 1534)    And  lidocaine  (XYLOCAINE ) 2 % viscous mouth solution 15 mL (15 mLs Oral Given 06/07/23 1534)    ED Course/ Medical Decision Making/ A&P                                 Medical Decision Making Amount and/or Complexity of Data Reviewed Labs: ordered. Radiology: ordered.  Risk OTC drugs. Prescription drug management. Decision regarding hospitalization.   This patient presents to the ED for concern of abdominal pain, nausea, vomiting, this involves an extensive number of treatment options, and is a complaint that carries with it a high risk of complications and morbidity.  The differential diagnosis includes progression of known cancer, bowel obstruction, intra-abdominal infection, hepatitis, pancreatitis, biliary disease, PUD   Co  morbidities / Chronic conditions that complicate the patient evaluation  HTN, metastatic colon cancer, nephrolithiasis, ascending aortic aneurysm, COPD, constipation   Additional history obtained:  Additional history obtained from EMR External records from outside source obtained and reviewed including N/A   Lab Tests:  I Ordered, and personally interpreted labs.  The pertinent results include: Normal hemoglobin, no leukocytosis, normal kidney function, hypokalemia with otherwise normal electrolytes, normal lactate, baseline elevation in alkaline phosphatase with otherwise normal hepatobiliary enzymes.  Equivocal urinalysis.   Imaging Studies ordered:  I ordered imaging studies including CT of abdomen and pelvis I independently visualized and interpreted imaging which showed right liver enhancement with asymmetric biliary dilatation in same distribution; overlying capsular peritoneal disease, extensive peritoneal carcinomatosis without evidence of obstruction or stent migration.  Redemonstration of known rectal mass. I agree with the radiologist interpretation   Cardiac Monitoring: / EKG:  The patient was maintained on a cardiac monitor.  I personally viewed and interpreted the cardiac monitored which showed an underlying rhythm of: Sinus rhythm   Problem List / ED Course / Critical interventions / Medication management  Patient presenting for worsening upper abdominal pain over the past 2 days in addition to nausea, vomiting, and p.o. intolerance since yesterday.  She has a history of metastatic colon cancer with peritoneal carcinomatosis and liver metastases.  She underwent ERCP with stent in February.  She had a cholecystitis in February and underwent a percutaneous cholecystostomy tube which was removed a month ago.  On arrival in the ED, patient's blood pressure is elevated.  This is in the setting of her p.o. intolerance and running out of her blood pressure medications.   Clonidine  patch was ordered.  Patient does seem to have a distended abdomen with some generalized tenderness.  Will obtain lab work and imaging.  Dilaudid  and Reglan  ordered for symptomatic relief.  IV fluids ordered for fluid replacement.  Patient required multiple doses of narcotic pain medication.  Lab work was notable for hypokalemia.  Replacing potassium was ordered.  CT imaging showed findings consistent with known metastatic disease.  Ultimately, patient's pain was not able to be controlled while in the ED.  She was admitted for further management. I ordered medication including fluids Reevaluation of the patient after these medicines showed that the patient hydration; Dilaudid , Toradol , GI cocktail for analgesia; Reglan  and Zofran  for nausea; amlodipine  and clonidine  for hypertension I have reviewed the patients home medicines and have made adjustments as needed  Social Determinants of Health:  Has access to outpatient care        Final Clinical Impression(s) / ED Diagnoses Final diagnoses:  Generalized abdominal pain    Rx / DC Orders ED Discharge Orders     None         Iva Mariner, MD 06/07/23 1645

## 2023-06-07 NOTE — Assessment & Plan Note (Signed)
Monitoring closely 

## 2023-06-07 NOTE — Assessment & Plan Note (Signed)
-   Currently stable, monitoring closely -Do not bronchodilators, supplemental oxygen

## 2023-06-07 NOTE — Assessment & Plan Note (Signed)
 Follow-up treatment with primary oncology

## 2023-06-07 NOTE — Assessment & Plan Note (Signed)
 Acute on chronic intractable abdominal pain due to metastatic colon cancer -Home medications reviewed, restarting oral Dilaudid  4-8 mg p.o. every 4 hours as needed-adding long -acting oxycodone  - Initiating her antinausea medication of Reglan , as needed Zofran  -Add stool softeners -Titrate above pain regimen for better pain control

## 2023-06-07 NOTE — ED Triage Notes (Signed)
 Pt bib EMS from home c/o abdominal pain for 3 days. Pt has stage 4 colon cancer. Pt states she has been unable to pick up her prescriptions for BP, pain so has not taken any in a week. Endorses N/V but denies diarrhea.

## 2023-06-07 NOTE — Assessment & Plan Note (Signed)
-   Potassium 3.0, being  - Currently being replaced with IV potassium through ED, will monitor closely, magnesium  at 2.1, also replacing

## 2023-06-07 NOTE — Assessment & Plan Note (Signed)
Stable continue metoprolol

## 2023-06-07 NOTE — Assessment & Plan Note (Signed)
-   Initiating home medication of Reglan  10 mg 4 times daily -As needed Zofran  (If no improvement, anticipating adding promethazine  or Compazine )

## 2023-06-07 NOTE — Assessment & Plan Note (Addendum)
-   Due to nausea and vomiting, poor p.o. intake - Sodium 130, gentle IV fluid resuscitation with normal saline - Will monitor sign closely

## 2023-06-07 NOTE — Assessment & Plan Note (Addendum)
-   Accelerated hypertension - patient ran out of home meds -Will resume home medications of: Norvasc , Clonidine , Lopressor , Adding as needed IV hydralazine

## 2023-06-07 NOTE — Assessment & Plan Note (Signed)
 Body mass index is 22.66 kg/m. - Once pain is under control, nausea vomiting improved -Will consult dietitian, adding dietary supplements

## 2023-06-08 DIAGNOSIS — I10 Essential (primary) hypertension: Secondary | ICD-10-CM | POA: Diagnosis present

## 2023-06-08 DIAGNOSIS — E43 Unspecified severe protein-calorie malnutrition: Secondary | ICD-10-CM | POA: Diagnosis present

## 2023-06-08 DIAGNOSIS — J439 Emphysema, unspecified: Secondary | ICD-10-CM | POA: Diagnosis present

## 2023-06-08 DIAGNOSIS — R9431 Abnormal electrocardiogram [ECG] [EKG]: Secondary | ICD-10-CM | POA: Diagnosis present

## 2023-06-08 DIAGNOSIS — Z823 Family history of stroke: Secondary | ICD-10-CM | POA: Diagnosis not present

## 2023-06-08 DIAGNOSIS — R1010 Upper abdominal pain, unspecified: Secondary | ICD-10-CM | POA: Diagnosis present

## 2023-06-08 DIAGNOSIS — R000 Tachycardia, unspecified: Secondary | ICD-10-CM | POA: Diagnosis present

## 2023-06-08 DIAGNOSIS — R109 Unspecified abdominal pain: Secondary | ICD-10-CM | POA: Diagnosis present

## 2023-06-08 DIAGNOSIS — E871 Hypo-osmolality and hyponatremia: Secondary | ICD-10-CM | POA: Diagnosis present

## 2023-06-08 DIAGNOSIS — C19 Malignant neoplasm of rectosigmoid junction: Secondary | ICD-10-CM | POA: Diagnosis present

## 2023-06-08 DIAGNOSIS — Z833 Family history of diabetes mellitus: Secondary | ICD-10-CM | POA: Diagnosis not present

## 2023-06-08 DIAGNOSIS — Z87891 Personal history of nicotine dependence: Secondary | ICD-10-CM | POA: Diagnosis not present

## 2023-06-08 DIAGNOSIS — C786 Secondary malignant neoplasm of retroperitoneum and peritoneum: Secondary | ICD-10-CM | POA: Diagnosis present

## 2023-06-08 DIAGNOSIS — Z95828 Presence of other vascular implants and grafts: Secondary | ICD-10-CM | POA: Diagnosis not present

## 2023-06-08 DIAGNOSIS — R1084 Generalized abdominal pain: Secondary | ICD-10-CM | POA: Diagnosis present

## 2023-06-08 DIAGNOSIS — E876 Hypokalemia: Secondary | ICD-10-CM | POA: Diagnosis present

## 2023-06-08 DIAGNOSIS — Z91138 Patient's unintentional underdosing of medication regimen for other reason: Secondary | ICD-10-CM | POA: Diagnosis not present

## 2023-06-08 DIAGNOSIS — Z8249 Family history of ischemic heart disease and other diseases of the circulatory system: Secondary | ICD-10-CM | POA: Diagnosis not present

## 2023-06-08 DIAGNOSIS — N200 Calculus of kidney: Secondary | ICD-10-CM | POA: Diagnosis present

## 2023-06-08 DIAGNOSIS — K5900 Constipation, unspecified: Secondary | ICD-10-CM | POA: Diagnosis present

## 2023-06-08 DIAGNOSIS — C182 Malignant neoplasm of ascending colon: Secondary | ICD-10-CM | POA: Diagnosis not present

## 2023-06-08 DIAGNOSIS — C772 Secondary and unspecified malignant neoplasm of intra-abdominal lymph nodes: Secondary | ICD-10-CM | POA: Diagnosis present

## 2023-06-08 DIAGNOSIS — C785 Secondary malignant neoplasm of large intestine and rectum: Secondary | ICD-10-CM | POA: Diagnosis present

## 2023-06-08 DIAGNOSIS — G893 Neoplasm related pain (acute) (chronic): Secondary | ICD-10-CM | POA: Diagnosis present

## 2023-06-08 DIAGNOSIS — C787 Secondary malignant neoplasm of liver and intrahepatic bile duct: Secondary | ICD-10-CM | POA: Diagnosis present

## 2023-06-08 DIAGNOSIS — Z6822 Body mass index (BMI) 22.0-22.9, adult: Secondary | ICD-10-CM | POA: Diagnosis not present

## 2023-06-08 LAB — BASIC METABOLIC PANEL WITH GFR
Anion gap: 8 (ref 5–15)
BUN: 5 mg/dL — ABNORMAL LOW (ref 6–20)
CO2: 26 mmol/L (ref 22–32)
Calcium: 8.8 mg/dL — ABNORMAL LOW (ref 8.9–10.3)
Chloride: 100 mmol/L (ref 98–111)
Creatinine, Ser: 0.39 mg/dL — ABNORMAL LOW (ref 0.44–1.00)
GFR, Estimated: >60 mL/min (ref >60)
Glucose, Bld: 104 mg/dL — ABNORMAL HIGH (ref 70–99)
Potassium: 3.2 mmol/L — ABNORMAL LOW (ref 3.5–5.1)
Sodium: 134 mmol/L — ABNORMAL LOW (ref 135–145)

## 2023-06-08 LAB — PHOSPHORUS: Phosphorus: 2.8 mg/dL (ref 2.5–4.6)

## 2023-06-08 MED ORDER — BISACODYL 5 MG PO TBEC
10.0000 mg | DELAYED_RELEASE_TABLET | Freq: Every day | ORAL | Status: DC
  Administered 2023-06-08: 10 mg via ORAL
  Filled 2023-06-08: qty 2

## 2023-06-08 MED ORDER — METOPROLOL TARTRATE 50 MG PO TABS
100.0000 mg | ORAL_TABLET | Freq: Two times a day (BID) | ORAL | Status: DC
  Administered 2023-06-08 (×2): 100 mg via ORAL
  Filled 2023-06-08 (×2): qty 2

## 2023-06-08 MED ORDER — SODIUM CHLORIDE 0.9 % IV SOLN: INTRAVENOUS | Status: AC

## 2023-06-08 MED ORDER — POTASSIUM CHLORIDE CRYS ER 20 MEQ PO TBCR
40.0000 meq | EXTENDED_RELEASE_TABLET | Freq: Every day | ORAL | Status: DC
  Administered 2023-06-08: 40 meq via ORAL
  Filled 2023-06-08: qty 2

## 2023-06-08 NOTE — Progress Notes (Signed)
 PROGRESS NOTE    Patient: April [name]                            PCP: Annella Kief, NP                    DOB: 1975-01-18            DOA: 06/07/2023 [id number]             DOS: 06/08/2023, 7:05 AM   LOS: 0 days   Date of Service: The patient was seen and examined on 06/08/2023  Subjective:   The patient was seen and examined this morning. Her blood pressure still elevated 177/124 this morning, denies any headaches visual changes denies any chest pain or shortness of breath Reporting pain improved with her Dilaudid  oral regimen, Could discontinue long-acting pain meds  Fortunately unable to get her pain medication today due to holiday from her pharmacy.  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 Scherrie Curt, every 3 week with  irinotecan  and panitumumab .  Last infusion was 10 days ago. [place] HTN, AAA, nephrolithiasis, COPD, constipation, chronic abdominal pain, managed with oral Dilaudid  at home Which she has ran out.    Presenting once again with intractable abdominal pain x 3 days, states unable to carry her ADLs due to extensive pain, ran out of her medications days ago.  Her symptoms now also associated with nausea vomiting but no diarrhea.  Note: Patient had a hospitalization on 03/11/2023 for cholecystitis was not a candidate for cholecystectomy, cholecystostomy tube was placed on 226 and removed on 4/11.  ED evaluation/course: Blood pressure (!) 188/140, pulse 93, temperature 97.9 F RR 16, height 5\' 4"  (1.626 m), weight 59.9 kg, last menstrual period 01/07/2006, SpO2 100%.  Labs: CBC within normal limits, CMP sodium 134, potassium 3.0, chloride 91, creatinine 0.31, glucose 103, lactic acid 0.7, magnesium  2.1.   Requested for patient to be admitted for acute on chronic abdominal  pain due to metastatic colon cancer.  Associated with poor p.o. intake, nausea and vomiting.     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 every 4 hours as needed-adding long Pain has improved with her home regimen of Dilaudid  -Continuing long-acting insulin as patient reports does not contribute - Initiating her antinausea medication of Reglan , as needed Zofran  -Add stool softeners -Titrate above pain regimen for better pain control    Hypertension - Accelerated hypertension - patient ran out of home meds -Will resume home medications of:  Norvasc  and milligram p.o. daily, Clonidine  increased from 0.1 to 0.3 mg patch, Lopressor  increased from  50 to 100 mg twice daily Adding as needed IV hydralazine   Protein-calorie malnutrition, severe Body mass index is 22.66 kg/m. - Once pain is under control, nausea vomiting improved -Will consult dietitian, adding dietary supplements  Emphysema/COPD (HCC) - Currently stable, monitoring closely -Do not bronchodilators, supplemental oxygen  Hyponatremia - Due to nausea and vomiting, poor p.o. intake - Sodium 130, gentle IV fluid resuscitation with normal saline - Serum sodium improved to 134,  Intractable nausea and vomiting - Initiating home medication of  Reglan  10 mg 4 times daily -As needed Zofran  -Scheduled her home medication of Compazine  - Tolerating p.o.  Metastatic colon cancer to liver [facility]) under treatment with her oncologist Dr. Scherrie Curt, every 3 week with  irinotecan  and panitumumab .  Last infusion was 10 days ago.   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) Continue treatment with oncologist Pain management  Colorectal cancer, stage IV (HCC) Follow-up treatment with primary oncology  Port-A-Cath in place Noted-elevated for function  Prolonged QT interval Monitoring closely  Hypokalemia - Potassium 3.0, being >> 3.2 this morning, repleting - Currently being replaced with IV potassium through ED, will monitor closely, magnesium  at 2.1, also replacing  Sinus tachycardia - Stable continue metoprolol    ----------------------------------------------------------------------------------------------------------------------------------------------- Nutritional status:  The patient's BMI is: Body mass index is 22.66 kg/m. I agree with the assessment and plan as   ------------------------------------------------------------------------------------------------------------------------------------------------  DVT prophylaxis:  heparin  injection 5,000 Units Start: 06/07/23 2200 TED hose Start: 06/07/23 1541 SCDs Start: 06/07/23 1541   Code Status:   Code Status: Full Code  Family Communication: No family member present at bedside- attempt will be made to update daily The above findings and plan of care has been discussed with patient (and family)  in detail,  they expressed understanding and agreement of above. -Advance care planning has been discussed.   Admission status:   Status is: Observation The patient remains OBS appropriate and will d/c before 2 midnights.   Disposition: From  - home             Planning for discharge in 1 days: to Home  Patient is to retrieve her home regimen  of pain medication from [facility] tomorrow Titrating her BP meds for better BP control, repleted her potassium as well  Procedures:   No admission procedures for hospital encounter.   Antimicrobials:  Anti-infectives (From admission, onward)    None        Medication:   amLODipine   10 mg Oral Daily   Chlorhexidine  Gluconate Cloth  6 each Topical Daily   cloNIDine   0.3 mg Transdermal Once   docusate sodium   200 mg Oral Daily   heparin   5,000 Units Subcutaneous Q8H   metoCLOPramide   10 mg Oral QID   metoprolol  tartrate  100 mg Oral BID   oxyCODONE   15 mg Oral Q12H   pantoprazole   40 mg Oral QAC breakfast   potassium chloride   40 mEq Oral Daily   sodium chloride  flush  3 mL Intravenous Q12H    acetaminophen  **OR** acetaminophen , alum & mag hydroxide-simeth, bisacodyl , hydrALAZINE , HYDROmorphone  (DILAUDID ) injection, HYDROmorphone , ipratropium, levalbuterol, ondansetron  **OR** ondansetron  (ZOFRAN ) IV, senna-docusate, sodium chloride  flush, sodium phosphate , traZODone   Objective:   Vitals:   06/07/23 2011 06/07/23 2216 06/08/23 0000 06/08/23 0505  BP: (!) 160/119 (!) 160/119 (!) 183/144 (!) 163/112  Pulse: 89 89 86 92  Resp: 18  17 17   Temp: 98.2 F (36.8 C)  98.5 F (36.9 C) 97.7 F (36.5 C)  TempSrc:  Oral  Oral Oral  SpO2: 99%  100% 100%  Weight:      Height:        Intake/Output Summary (Last 24 hours) at 06/08/2023 1610 Last data filed at 06/08/2023 0307 Gross per 24 hour  Intake 1255 ml  Output --  Net 1255 ml   Filed Weights   06/07/23 0738  Weight: 59.9 kg     Physical examination:   Constitution:  Alert, cooperative, no distress,  Appears calm and comfortable  Psychiatric:   Normal and stable mood and affect, cognition intact,   HEENT:        Normocephalic, PERRL, otherwise with in Normal limits  Chest:         Chest symmetric Cardio vascular:  S1/S2, RRR, No murmure, No Rubs or Gallops  pulmonary: Clear to auscultation bilaterally,  respirations unlabored, negative wheezes / crackles Abdomen: Soft, non-tender, non-distended, bowel sounds,no masses, no organomegaly Muscular skeletal: Limited exam - in bed, able to move all 4 extremities,   Neuro: CNII-XII intact. , normal motor and sensation, reflexes intact  Extremities: No pitting edema lower extremities, +2 pulses  Skin: Dry, warm to touch, negative for any Rashes, No open wounds Wounds: per nursing documentation   ------------------------------------------------------------------------------------------------------------------------------------------    LABs:     Latest Ref Rng & Units 06/07/2023    8:14 AM 06/04/2023    2:03 PM 05/28/2023    9:25 AM  CBC  WBC 4.0 - 10.5 K/uL 7.5  9.3  8.2   Hemoglobin 12.0 - 15.0 g/dL 96.0  45.4  09.8   Hematocrit 36.0 - 46.0 % 37.0  33.9  36.2   Platelets 150 - 400 K/uL 362  331  310       Latest Ref Rng & Units 06/08/2023    5:00 AM 06/07/2023    8:14 AM 06/04/2023    2:03 PM  CMP  Glucose 70 - 99 mg/dL 119  147  88   BUN 6 - 20 mg/dL 5  <5  7   Creatinine 8.29 - 1.00 mg/dL 5.62  1.30  8.65   Sodium 135 - 145 mmol/L 134  134  138   Potassium 3.5 - 5.1 mmol/L 3.2  3.0  2.8   Chloride 98 - 111 mmol/L 100  91  93   CO2 22 - 32 mmol/L 26  31  37   Calcium  8.9 - 10.3 mg/dL 8.8  9.2  9.4   Total Protein 6.5 - 8.1 g/dL  7.8  7.6   Total Bilirubin 0.0 - 1.2 mg/dL  1.0  0.7   Alkaline Phos 38 - 126 U/L  797  777   AST 15 - 41 U/L  74  55   ALT 0 - 44 U/L  42  35        Micro Results No results found for this or any previous visit (from the past 240 hours).  Radiology Reports CT ABDOMEN PELVIS W CONTRAST Result Date: 06/07/2023 CLINICAL DATA:  Abdominal pain for 3 days.  Stage IV liver cancer. EXAM: CT ABDOMEN AND PELVIS WITH CONTRAST TECHNIQUE: Multidetector CT imaging of the abdomen and pelvis was performed using the standard protocol following bolus administration of intravenous contrast. RADIATION DOSE REDUCTION:  This exam was performed according to the departmental dose-optimization program which includes automated exposure control, adjustment of the mA and/or kV according to patient size and/or use of iterative reconstruction technique. CONTRAST:  OMNIPAQUE  IOHEXOL  300 MG/ML  SOLN COMPARISON:  PET CT  from 3 days ago FINDINGS: Lower chest:  No contributory findings. Hepatobiliary: Heterogeneous enhancement the liver with surface distortion attributed to peritoneal disease. This mainly affects the right lobe and could also be explained by asymmetric right intrahepatic biliary dilatation. Biliary stents are in expected position. Percutaneous cholecystostomy tube has been removed since April, no evidence of gallbladder obstruction. Pancreas: Ductal dilatation similar to prior, including both accessory and main ducts which may be related to the stenting. No acute pancreatitis seen. Spleen: Unremarkable. Adrenals/Urinary Tract: Negative adrenals. No hydronephrosis or ureteral stone. Bilateral nephrolithiasis with multiple calculi, largest at the interpolar left kidney measuring 11 mm. Unremarkable bladder. Stomach/Bowel: No bowel obstruction or visible inflammation. Upper rectal wall thickening with regional calcification, fat distortion, and adenopathy. Vascular/Lymphatic: No emergent vascular finding. Enlarged nodes, some calcified, in the IMA distribution reaching the lower aorta. No emergent vascular finding. 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 Reproductive:Cystic density in the low left pelvis could be trapped peritoneal fluid or ovarian, up to 5 cm without worrisome internal complexity. Other: Bulky peritoneal metastatic disease with widespread nodularity most confluent in the ventral right upper quadrant. Musculoskeletal: No acute or aggressive finding. 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 IMPRESSION: Heterogeneous enhancement diffusely affecting the right liver, much of this may  be related to asymmetric biliary dilatation in the same distribution. There is also overlying capsular peritoneal disease. Biliary stents remain in normal position. Extensive peritoneal metastatic disease without bowel obstruction. Ascites remains small volume. 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 Electronically Signed   By: Ronnette Coke M.D.   On: 06/07/2023 10:23    SIGNED: Bobbetta Burnet, MD, FHM. FAAFP. [facility] Time spent - 35 min.  In seeing, evaluating and examining the patient. Reviewing medical records, labs, drawn plan of care. [facility],  Pager (please use [email or web address] to page/ text) Please use Epic Secure Chat for non-urgent communication (7AM-7PM)  If 7PM-7AM, please contact night-coverage [email or web address], 06/08/2023, 7:05 AM

## 2023-06-08 NOTE — Progress Notes (Signed)
 IP PROGRESS NOTE  Subjective:   April [name] is well-known to me with a history of metastatic colon cancer.  She is currently being treated with irinotecan /panitumumab , last given on 05/07/2023.  She presented to emergency room yesterday with abdominal pain.  She ran out of hydromorphone  and was unable to arrange for transportation to pick up the medication. She reports no pain today.  No difficulty with bowel or bladder function. She underwent a restaging PET on 06/04/2023.  The PET has not been read.  Objective: Vital signs in last 24 hours: Blood pressure (!) 163/112, pulse 92, temperature 97.7 F (36.5 C), temperature source Oral, resp. rate 17, height 5\' 4"  (1.626 m), weight 132 lb (59.9 kg), last menstrual period 01/07/2006, SpO2 100%.  Intake/Output from previous day: 05/25 0701 - 05/26 0700 In: 1255 [P.O.:240; I.V.:1015] Out: -   Physical Exam:  Lungs: Lungs clear bilaterally Cardiac: Regular rate and rhythm Abdomen: No hepatosplenomegaly, firm masslike fullness in the right lower abdomen, mild tenderness in the right upper and lower abdomen Extremities: No leg edema   Portacath/PICC-without erythema  Lab Results: Recent Labs    06/07/23 0814  WBC 7.5  HGB 12.0  HCT 37.0  PLT 362    BMET Recent Labs    06/07/23 0814 06/08/23 0500  NA 134* 134*  K 3.0* 3.2*  CL 91* 100  CO2 31 26  GLUCOSE 103* 104*  BUN <5* 5*  CREATININE 0.31* 0.39*  CALCIUM  9.2 8.8*    Lab Results  Component Value Date   CEA1 <0.6 03/23/2022   CEA 39.74 (H) 06/04/2023   CAN199 213 (H) 03/23/2022    Studies/Results: CT ABDOMEN PELVIS W CONTRAST Result Date: 06/07/2023 CLINICAL DATA:  Abdominal pain for 3 days.  Stage IV liver cancer. EXAM: CT ABDOMEN AND PELVIS WITH CONTRAST TECHNIQUE: Multidetector CT imaging of the abdomen and pelvis was performed using the standard protocol following bolus administration of intravenous contrast. RADIATION DOSE REDUCTION: This exam was performed  according to the departmental dose-optimization program which includes automated exposure control, adjustment of the mA and/or kV according to patient size and/or use of iterative reconstruction technique. CONTRAST:  OMNIPAQUE  IOHEXOL  300 MG/ML  SOLN COMPARISON:  PET CT from 3 days ago FINDINGS: Lower chest:  No contributory findings. Hepatobiliary: Heterogeneous enhancement the liver with surface distortion attributed to peritoneal disease. This mainly affects the right lobe and could also be explained by asymmetric right intrahepatic biliary dilatation. Biliary stents are in expected position. Percutaneous cholecystostomy tube has been removed since April, no evidence of gallbladder obstruction. Pancreas: Ductal dilatation similar to prior, including both accessory and main ducts which may be related to the stenting. No acute pancreatitis seen. Spleen: Unremarkable. Adrenals/Urinary Tract: Negative adrenals. No hydronephrosis or ureteral stone. Bilateral nephrolithiasis with multiple calculi, largest at the interpolar left kidney measuring 11 mm. Unremarkable bladder. Stomach/Bowel: No bowel obstruction or visible inflammation. Upper rectal wall thickening with regional calcification, fat distortion, and adenopathy. Vascular/Lymphatic: No emergent vascular finding. Enlarged nodes, some calcified, in the IMA distribution reaching the lower aorta. No emergent vascular finding. 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 Reproductive:Cystic density in the low left pelvis could be trapped peritoneal fluid or ovarian, up to 5 cm without worrisome internal complexity. Other: Bulky peritoneal metastatic disease with widespread nodularity most confluent in the ventral right upper quadrant. Musculoskeletal: No acute or aggressive finding. 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 IMPRESSION: Heterogeneous enhancement diffusely affecting the right liver, much of this  may be related to  asymmetric biliary dilatation in the same distribution. There is also overlying capsular peritoneal disease. Biliary stents remain in normal position. Extensive peritoneal metastatic disease without bowel obstruction. Ascites remains small volume. 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 Electronically Signed   By: Ronnette Coke M.D.   On: 06/07/2023 10:23    Medications: I have reviewed the patient's current medications.  Assessment/Plan: Sigmoid colon cancer, stage IV (pT4a,pN2b,M1c) Colonoscopy 03/24/2019-3 rectal polyps-hyperplastic polyps, distal colon biopsy-at least intramucosal adenocarcinoma, completely obstructing mass in the distal sigmoid colon, could not be traversed, intact mismatch repair protein expression 03/24/2019-CEA 56.1 03/29/2019 CT abdomen/pelvis-circumferential thickening involving the entire mid and distal sigmoid colon to the level of the rectosigmoid junction, solid/cystic mass in the left ovary, bilateral nephrolithiasis Robotic assisted low anterior resection, mesenteric lymphadenectomy, bilateral salpingo-oophorectomy 04/08/2019 Pathology ([facility] review of outside pathology) metastatic adenocarcinoma involving the peritoneum overlying the round ligament, serosa of the urinary bladder, serosa of the right ureter a sacral area, pelvic peritoneum, and left ovary.  Omental biopsy with focal mucin pools with no carcinoma cells identified, right hemidiaphragm biopsy involved by metastatic adenocarcinoma, invasive adenocarcinoma the sigmoid colon, moderately differentiated, T4a, perineural and vascular invasion present, 7/22 lymph nodes, multiple tumor deposits, resection margins negative Negative for PD-L1, low probability of MSI-high, HER-2 negative, negative for BRAF, NRAS and KRAS alterations CTs 05/19/2019-no evidence of metastatic disease, findings suspicious for colitis of the transverse and ascending colon, small amount of ascites in the cul-de-sac, bilateral renal  calculi Cycle 1 FOLFIRI 05/24/2019, bevacizumab  added with cycle 2 Cycle 4 FOLFIRI/bevacizumab  07/05/2019 Cycle 5 FOLFIRI 07/27/2019, bevacizumab  held secondary to hypertension Cycle 6 FOLFIRI 08/10/1999, bevacizumab  held secondary to hypertension CTs 08/30/2019-no evidence of recurrent disease, new subsolid right lower lobe nodule felt to be inflammatory, emphysema Cycle 7 FOLFIRI 09/26/2019, bevacizumab  remains on hold secondary to hypertension Cycle 8 FOLFIRI 10/17/2019, bevacizumab  held secondary to hypertension, Udenyca  added for neutropenia Cycle 9 FOLFIRI 11/07/2019, bevacizumab  held secondary to hypertension, Udenyca  Cycle 10 FOLFIRI 11/28/2019, bevacizumab  held, Udenyca  Cycle 11 FOLFIRI 12/26/2019, bevacizumab  held, Udenyca  CTs 01/25/2020-no evidence of recurrent disease, resolution of right lower lobe nodule Maintenance Xeloda  beginning 02/06/2020 CTs 04/25/2020- no evidence of metastatic disease, multiple bilateral renal calculi without hydronephrosis Maintenance Xeloda  continued CT abdomen/pelvis without contrast 08/10/2020-bilateral staghorn renal calculi, no evidence of metastatic disease; addendum 08/23/2020-small but increasing omental nodules. Cycle 1 FOLFIRI/panitumumab  09/19/2020 Cycle 2 FOLFIRI/Panitumumab  10/03/2020 Cycle 3 FOLFIRI/Panitumumab  10/17/2020 Cycle 4 FOLFIRI/panitumumab  10/31/2020 Cycle 5 FOLFIRI/Panitumumab  11/14/2020 CTs 11/26/2020-stable omental metastases compared to 10/22/2020, mildly decreased from 08/10/2020.  Left lower quadrant tiny paracolic gutter implant slightly decreased from CT 08/10/2020.  No new or progressive metastatic disease in the abdomen or pelvis. Cycle 6 FOLFIRI/Panitumumab  11/28/2020, irinotecan  dose reduced, treatment schedule adjusted to every 3 weeks going forward Cycle 7 FOLFIRI/panitumumab  12/24/2020 Cycle 8 FOLFIRI/Panitumumab  01/16/2021--treatment held due to hypertension in the infusion area. Cycle 8 FOLFIRI/panitumumab  01/21/2021 Cycle 9  FOLFIRI/panitumumab  02/11/2021 Cycle 10 FOLFIRI/Panitumumab  03/04/2021 Cycle 11 FOLFIRI/Panitumumab  03/25/2021 CT abdomen/pelvis 04/10/2021-stable right omental and left posterior paracolic gutter nodules Cycle 12 FOLFIRI/panitumumab  04/15/2021 Cycle 13 FOLFIRI/panitumumab  05/06/2021 Cycle 14 FOLFIRI/Panitumumab  05/27/2021 Cycle 15 FOLFIRI/Panitumumab  06/17/2021 CT abdomen/pelvis 07/05/2021-slight enlargement of a dominant right omental implant, no new implants Patient requested a treatment break CT abdomen/pelvis 10/04/2021-mild increase in size of multiple omental soft tissue nodules, 4 mm calculus in the distal left ureter adjacent to the ureteral stent Cycle 1 Lonsurf  10/14/2021 10/28/2021 Avastin  every 2 weeks Cycle 2 Lonsurf  11/11/2021 Cycle 3 Lonsurf  12/09/2021 Cycle 4  Lonsurf  01/06/2022 Avastin  held 01/20/2022 due to proteinuria, 24-hour urine 43 mg CT/pelvis 01/30/2022-possible new peritoneal implant at the transverse colon, no significant change in other omental/peritoneal implants, stable chronic rectal wall thickening, status post removal of double-J left ureteral stent with no evidence of hydronephrosis or ureteral calculus, nonobstructing bilateral renal calculi Cycle 5 Lonsurf  02/05/2022 or 02/06/2022 Avastin  every 2 weeks Cycle 6 Lonsurf  03/03/2022 CTs 04/05/2022-increase in size and number of peritoneal implants compared to January 2024 central liver mass narrowing the anterior branch of the right portal vein, decreased biliary duct dilation, biliary stents in place, stable right cardiophrenic lymph nodes, separate pigtail stent in the lumen of the proximal duodenum Cycle 1 FOLFOX 04/14/2022 CT abdomen/pelvis 04/16/2022-stent within the third portion of the duodenum, stable peritoneal nodules, indwelling biliary stents with mild left biliary duct dilation Cycle 2 FOLFOX 04/28/2022, Emend and prophylactic dexamethasone  added Cycle 3 FOLFOX 05/12/2022, Aloxi , Emend, prophylactic dexamethasone ,  Compazine  and lorazepam  as needed Chemotherapy held 05/25/2020 due to severe hypertension, evaluated in the emergency department Cycle 4 FOLFOX 06/03/2022 Cycle 5 FOLFOX 06/16/2022, oxaliplatin  dose reduced and 5-FU bolus eliminated due to neutropenia CT 06/25/2022-unchanged soft tissue at the hepatic hilum, unchanged, bile duct stent, decreased size of peritoneal and omental nodules and diminished volume of likely fluid in the lower left pelvis Cycle 6 FOLFOX 06/30/2022 Cycle 7 FOLFOX 07/21/2022 CT 07/24/2022 (in the emergency department with nausea, headache, abdominal pain)-no acute pathology.  No significant interval change in omental nodularity. Cycle 8 FOLFOX 08/04/2022, pruritus on the palms at the end of the oxaliplatin  infusion, Pepcid  and Benadryl  given, symptoms resolved, infusion not resumed Cycle 9 FOLFOX 08/18/2022-Pepcid  and Benadryl  added to premedications, Oxaliplatin  diluted in a larger volume and infusion time increased Cycle 10 FOLFOX 09/08/2022 Cycle 11 FOLFOX 09/22/2022 10/06/2022 patient declined treatment CTs 10/15/2022-similar soft tissue fullness in the hepatic hilum with biliary stents in place, decrease in omental/peritoneal metastases Maintenance capecitabine  10/26/2022 CT abdomen/pelvis 11/19/2022-mild increase in tumor studding at the left paracolic gutter and omentum, circumferential wall thickening of the rectum, biliary stents in place with minimal increase in intrahepatic biliary dilation, nonobstructive renal calculi Colonoscopy 11/23/2022-anastomosis at 15 cm from the anal verge-patent, 2 cm polypoid tissue at the anastomosis biopsied, invasive adenocarcinoma moderately differentiated, preserved expression of major MMR proteins CTs 12/15/2022: Possible developing nodule in the lateral left liver, increased omental nodularity, persistent wall thickening in the upper rectum CT abdomen/pelvis 12/24/2022: Increased thickened fold in the transverse colon-infectious/inflammatory, 2.1 cm  lateral left liver lesion, potential new 5 mm segment 5 lesion, stable biliary stents with intrahepatic biliary dilation, central biliary wall thickening near the hepatic hilum, multiple peritoneal/omental implants unchanged from 12/15/2022, but likely larger than 11/19/2022 Cycle 1 irinotecan /Panitumumab  01/06/2023 Cycle 2 irinotecan /Panitumumab  01/20/2023 CTs 01/24/2023-slight increase in omental nodularity. Cycle 3 irinotecan /Panitumumab  02/03/2023 Cycle 4 irinotecan /panitumumab  02/17/2023 CTs abdomen/pelvis 02/24/2023-subtly hypoattenuating lesion within the peripheral aspect of hepatic segment 2 redemonstrated.  Additional scattered subcentimeter low-density lesions within the liver too small to characterize.  Prominent intrahepatic biliary dilatation with biliary stents in place.  Similar degree of nodularity along the omentum and left paracolic gutter.  No ascites. CTs 03/09/2023-indwelling endoscopically placed stents; level of intrahepatic biliary duct dilatation is slightly improved on the left and some on the right.  Gallbladder is dilated with wall edema which is increasing.  More nodular tissue extending up along the porta hepatis.  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  Rectum with nodular wall thickening with extension into the adjacent tissue, nodular areas seen extending  perirectal and presacral spaces.  Peritoneal carcinomatosis again identified. Irinotecan /Panitumumab  04/02/2023 Irinotecan /panitumumab  04/16/2023 Irinotecan /Panitumumab  05/07/2023   Hypertension G4 P3, twins Kidney stones-bilateral staghorn renal calculi on CT 08/10/2020 Infected Port-A-Cath 09/12/2019-placed on Augmentin , referred for Port-A-Cath removal; Port-A-Cath removed 09/14/2019; PICC line placed 09/14/2019; culture staph aureus.  Course of Septra  completed. Neutropenia secondary to chemotherapy-Udenyca  added with cycle 8 FOLFIRI Right nephrostomy tube 09/12/2020; stent placed 10/09/2020, percutaneous  nephrostolithotomy treatment of right-sided kidney stones 10/09/2020, malpositioned right ureter stent replacement 10/23/2020, right ureter stent removed 10/29/2020 Percutaneous left nephrostolithotomy 10/01/2021-no renal stone identified, left ureter stent left in place Cystoscopy 10/14/2021-stent removed 8.  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  2 moderate biliary strictures found in the hepatic duct system, malignant appearing.  Left and right hepatic duct and all intrahepatic branches were moderately dilated secondary to the strictures.  The strictures were dilated.  Plastic stent placed into the left hepatic duct and right hepatic duct. 9.  Admission 04/16/2022 with nausea/vomiting and abdominal pain, felt to be acute toxicity related to chemotherapy, discharged home 04/18/2022 10.  Admission 11/19/2022 with nausea/vomiting and abdominal pain-improved 11.  Admission 12/15/2022 with fever and right submandibular swelling/tenderness CT neck 12/15/2022: 2 mm distal right submandibular duct calculus with mild inflammatory change adjacent to the right submandibular gland 12.  Admission 12/24/2022 with a fever nausea/vomiting, diarrhea, Klebsiella bacteremia completed course of Augmentin  13.  Admission with abdominal pain and nausea/vomiting 03/09/2023 through 03/14/2023-concern for gallbladder inflammation.  Not felt to be a  surgical candidate.  Percutaneous cholecystostomy tube placed 03/11/2023.  Tube removed 04/24/2023. 14.  Admission 06/07/2023 with abdominal pain-ran out of pain medication   April [name] has metastatic colon cancer.  She has abdominal pain secondary to carcinomatosis.  She was last treated with irinotecan /panitumumab  05/07/2023.  She is admitted with uncontrolled abdominal pain due to running out of her pain medication.  I reviewed the 06/04/2023 PET and 06/07/2023 CT images.  There is evidence of carcinomatosis.  We will wait on the final reading for a direct comparison to CTs earlier this year.  She feels the irinotecan /panitumumab  is helping.  I discussed the PET and CT findings with her.  She understands standard systemic treatment options are limited.  She appears stable for discharge when an outpatient supply of Dilaudid  can be obtained.  Recommendations: Continue outpatient narcotic regimen, arrange for short supply of Dilaudid  to last 1-2 days, refill should be available at the [facility] tomorrow Replete potassium Be sure she is compliant with the antihypertensive regimen and blood pressures under better control prior to discharge Outpatient [facility]-up as scheduled [facility] 06/11/2023  LOS: 0 days   Coni Deep, MD   06/08/2023, 7:08 AM

## 2023-06-08 NOTE — Plan of Care (Signed)
  Problem: Clinical Measurements: Goal: Will remain free from infection Outcome: Progressing Goal: Diagnostic test results will improve Outcome: Progressing   Problem: Activity: Goal: Risk for activity intolerance will decrease Outcome: Progressing   Problem: Elimination: Goal: Will not experience complications related to bowel motility Outcome: Progressing Goal: Will not experience complications related to urinary retention Outcome: Progressing   Problem: Pain Managment: Goal: General experience of comfort will improve and/or be controlled Outcome: Progressing   Problem: Safety: Goal: Ability to remain free from injury will improve Outcome: Progressing

## 2023-06-09 ENCOUNTER — Encounter: Payer: Self-pay | Admitting: Oncology

## 2023-06-09 ENCOUNTER — Other Ambulatory Visit (HOSPITAL_BASED_OUTPATIENT_CLINIC_OR_DEPARTMENT_OTHER): Payer: Self-pay

## 2023-06-09 ENCOUNTER — Encounter: Payer: Self-pay | Admitting: *Deleted

## 2023-06-09 ENCOUNTER — Other Ambulatory Visit (HOSPITAL_COMMUNITY): Payer: Self-pay

## 2023-06-09 DIAGNOSIS — C182 Malignant neoplasm of ascending colon: Secondary | ICD-10-CM | POA: Diagnosis not present

## 2023-06-09 DIAGNOSIS — C772 Secondary and unspecified malignant neoplasm of intra-abdominal lymph nodes: Secondary | ICD-10-CM | POA: Diagnosis not present

## 2023-06-09 LAB — BASIC METABOLIC PANEL WITH GFR
Anion gap: 8 (ref 5–15)
BUN: 7 mg/dL (ref 6–20)
CO2: 28 mmol/L (ref 22–32)
Calcium: 7.6 mg/dL — ABNORMAL LOW (ref 8.9–10.3)
Chloride: 94 mmol/L — ABNORMAL LOW (ref 98–111)
Creatinine, Ser: 0.38 mg/dL — ABNORMAL LOW (ref 0.44–1.00)
GFR, Estimated: >60 mL/min (ref >60)
Glucose, Bld: 90 mg/dL (ref 70–99)
Potassium: 3.9 mmol/L (ref 3.5–5.1)
Sodium: 130 mmol/L — ABNORMAL LOW (ref 135–145)

## 2023-06-09 MED ORDER — CLONIDINE HCL 0.1 MG PO TABS
0.3000 mg | ORAL_TABLET | Freq: Three times a day (TID) | ORAL | 2 refills | Status: DC
  Filled 2023-06-09: qty 90, 10d supply, fill #0

## 2023-06-09 MED ORDER — METOPROLOL TARTRATE 100 MG PO TABS
100.0000 mg | ORAL_TABLET | Freq: Two times a day (BID) | ORAL | 1 refills | Status: DC
  Filled 2023-06-09: qty 30, 15d supply, fill #0

## 2023-06-09 MED ORDER — POLYETHYLENE GLYCOL 3350 17 GM/SCOOP PO POWD
17.0000 g | Freq: Every day | ORAL | 0 refills | Status: DC | PRN
  Filled 2023-06-09: qty 238, 14d supply, fill #0

## 2023-06-09 MED ORDER — DOCUSATE SODIUM 100 MG PO CAPS
200.0000 mg | ORAL_CAPSULE | Freq: Every day | ORAL | 0 refills | Status: DC
  Filled 2023-06-09: qty 10, 5d supply, fill #0

## 2023-06-09 NOTE — Plan of Care (Signed)

## 2023-06-09 NOTE — Discharge Summary (Signed)
 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 *  Hospital Course: Drishti Pepperman is a extensive history of metastatic colon cancer to peritoneal and liver, biliary stricture, with history of ERCP and stenting on on 2/13, under treatment with her oncologist Dr. Scherrie Curt, every 3 week with  irinotecan  and panitumumab .  Last infusion was 10 days ago. [place] HTN, AAA, nephrolithiasis, COPD, constipation, chronic abdominal pain, managed with oral Dilaudid  at home Which she has ran out.    Presenting once again with intractable abdominal pain x 3 days, states unable to carry her ADLs due to extensive pain, ran out of her medications days ago.  Her symptoms now also associated with nausea vomiting but no diarrhea.  Note: Patient had a hospitalization on 03/11/2023 for cholecystitis was not a candidate for cholecystectomy, cholecystostomy tube was  placed on 226 and removed on 4/11.  ED evaluation/course: Blood pressure (!) 188/140, pulse 93, temperature 97.9 F RR 16, height 5\' 4"  (1.626 m), weight 59.9 kg, last menstrual period 01/07/2006, SpO2 100%.  Labs: CBC within normal limits, CMP sodium 134, potassium 3.0, chloride 91, creatinine 0.31, glucose 103, lactic acid 0.7, magnesium  2.1.   Requested for patient to be admitted for acute on chronic abdominal pain due to metastatic colon cancer.  Associated with poor p.o. intake, nausea and vomiting.    Upper abdominal pain Acute on chronic intractable abdominal pain due to metastatic colon cancer -Home medications reviewed, restarting oral Dilaudid  4-8 mg p.o. every 4 hours as needed-adding long -acting oxycodone  - Initiating her antinausea medication of Reglan , as needed Zofran  -Add stool softeners -Titrate above pain regimen for better pain control    Hypertension - Accelerated hypertension - patient ran out of home meds -Will resume home medications of: Norvasc , Clonidine , Lopressor , Changes: Clonidine  increased to 0.3 mg, Lopressor  increased from 50 to 100 mg p.o. twice daily  Protein-calorie malnutrition, severe Body mass index is 22.66 kg/m. - Once pain is under control, nausea vomiting improved -Will consult dietitian, adding dietary supplements  Emphysema/COPD (HCC) - Currently stable, monitoring closely -Do not bronchodilators, supplemental oxygen  Hyponatremia - Due to nausea and vomiting, poor p.o. intake - Sodium 130, S/p gentle IV fluid resuscitation with normal saline   Intractable nausea and vomiting - Initiating home medication of Reglan  10 mg 4 times daily -As needed Zofran    Metastatic colon cancer to liver [facility]) under treatment with her oncologist Dr. Scherrie Curt, every 3 week with  irinotecan  and panitumumab .  Last infusion was 10 days ago.   Secondary malignant neoplasm of retroperitoneum and peritoneum [facility]) Continue treatment with  oncologist Pain management  Colorectal cancer, stage IV (HCC) Follow-up treatment with primary oncology  Port-A-Cath in place Noted-elevated for function  Prolonged QT interval Monitoring closely  Hypokalemia - Potassium 3.0, being  - Currently being replaced with IV potassium through ED, will monitor closely, magnesium  at 2.1, was replaced  Sinus tachycardia - Stable continue metoprolol      Disposition: Home Diet recommendation:  Discharge Diet Orders (From admission, onward)     Start     Ordered   06/09/23 0000  Diet - low sodium heart healthy        06/09/23 0656           Regular diet DISCHARGE MEDICATION: Allergies as of 06/09/2023       Reactions   Lisinopril Swelling, Other (See Comments)   Lip swelling and Angioedema   Irinotecan  Other (See Comments)   Stomach cramping Patient given 0.5 mL atropine  IV and was able to continue infusion.  See Progress note on 10/03/20.    Leucovorin  Calcium  Itching, Other (See Comments)   Patient reported itchy palms towards end of transfusion, see progress note 08/04/22.   Oxaliplatin  Itching, Other (See Comments)   Patient reported itchy palms towards end of transfusion, see progress note on 08/04/22.        Medication List     STOP taking these medications    dexamethasone  4 MG tablet Commonly known as: DECADRON    prochlorperazine  10 MG tablet Commonly known as: COMPAZINE        TAKE these medications    amLODipine  10 MG tablet Commonly known as: NORVASC  Take 1 tablet (10 mg total) by mouth daily.   cloNIDine  0.1 MG tablet Commonly known as: CATAPRES  Take 3 tablets (0.3 mg total) by mouth 3 (three) times daily. What changed: how much to take   dicyclomine  20 MG tablet Commonly known as: BENTYL  Take 1 tablet (20 mg total) by mouth 3 (three) times daily as needed for spasms (rectal/AB pain).   docusate sodium  100 MG capsule Commonly known as: COLACE Take 2 capsules (200 mg total) by mouth daily.    HYDROmorphone  4 MG tablet Commonly known as: DILAUDID  Take 1-2 tablets (4-8 mg total) by mouth every 4 (four) hours as needed for severe pain (pain score 7-10) or moderate pain (pain score 4-6).   lactulose  10 GM/15ML solution Commonly known as: CHRONULAC  Take 15-30 mLs (10-20 g total) by mouth daily as needed for mild constipation.   metoCLOPramide  10 MG tablet Commonly known as: REGLAN  Take 1 tablet (10 mg total) by mouth 4 (four) times daily.   metoprolol  tartrate 100 MG tablet Commonly known as: LOPRESSOR  Take 1 tablet (100 mg total) by mouth 2 (two) times daily. What changed:  medication strength how much to take   Normal Saline Flush 0.9 % Soln Use 5 mLs by Intracatheter route daily in biliary drain at 6 (six) AM as directed What changed: additional instructions   pantoprazole  40 MG tablet Commonly known as: PROTONIX  Take 1 tablet (40 mg total) by mouth daily before breakfast.   polyethylene glycol 17 g packet Commonly known as: MIRALAX  / GLYCOLAX  Take 17 g by mouth daily as needed.   potassium chloride  10 MEQ CR capsule Commonly known as: MICRO-K  Take 2 capsules (20 mEq total) by mouth 2 (two) times daily.        Discharge Exam: Cleavon Curls Weights   06/07/23  1478  Weight: 59.9 kg        General:  AAO x 3,  cooperative, no distress;   HEENT:  Normocephalic, PERRL, otherwise with in Normal limits   Neuro:  CNII-XII intact. , normal motor and sensation, reflexes intact   Lungs:   Clear to auscultation BL, Respirations unlabored,  No wheezes / crackles  Cardio:    S1/S2, RRR, No murmure, No Rubs or Gallops   Abdomen:  Soft, non-tender, bowel sounds active all four quadrants, no guarding or peritoneal signs.  Muscular  skeletal:  Limited exam -global generalized weaknesses - in bed, able to move all 4 extremities,   2+ pulses,  symmetric, No pitting edema  Skin:  Dry, warm to touch, negative for any Rashes,  Wounds: Please see nursing documentation           Condition at discharge: good  The results of significant diagnostics from this hospitalization (including imaging, microbiology, ancillary and laboratory) are listed below for reference.   Imaging Studies: CT ABDOMEN PELVIS W CONTRAST Result Date: 06/07/2023 CLINICAL DATA:  Abdominal pain for 3 days.  Stage IV liver cancer. EXAM: CT ABDOMEN AND PELVIS WITH CONTRAST TECHNIQUE: Multidetector CT imaging of the abdomen and pelvis was performed using the standard protocol following bolus administration of intravenous contrast. RADIATION DOSE REDUCTION: This exam was performed according to the departmental dose-optimization program which includes automated exposure control, adjustment of the mA and/or kV according to patient size and/or use of iterative reconstruction technique. CONTRAST:  OMNIPAQUE  IOHEXOL  300 MG/ML  SOLN COMPARISON:  PET CT from 3 days ago FINDINGS: Lower chest:  No contributory findings. Hepatobiliary: Heterogeneous enhancement the liver with surface distortion attributed to peritoneal disease. This mainly affects the right lobe and could also be explained by asymmetric right intrahepatic biliary dilatation. Biliary stents are in expected position. Percutaneous cholecystostomy tube has been removed since April, no evidence of gallbladder obstruction. Pancreas: Ductal dilatation similar to prior, including both accessory and main ducts which may be related to the stenting. No acute pancreatitis seen. Spleen: Unremarkable. Adrenals/Urinary Tract: Negative adrenals. No hydronephrosis or ureteral stone. Bilateral nephrolithiasis with multiple calculi, largest at the interpolar left kidney measuring 11 mm. Unremarkable bladder. Stomach/Bowel: No bowel obstruction or visible inflammation. Upper rectal wall thickening with regional calcification, fat distortion, and adenopathy. Vascular/Lymphatic: No emergent vascular finding. Enlarged nodes, some calcified, in the IMA distribution  reaching the lower aorta. No emergent vascular finding. 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 Reproductive:Cystic density in the low left pelvis could be trapped peritoneal fluid or ovarian, up to 5 cm without worrisome internal complexity. Other: Bulky peritoneal metastatic disease with widespread nodularity most confluent in the ventral right upper quadrant. Musculoskeletal: No acute or aggressive finding. 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 IMPRESSION: Heterogeneous enhancement diffusely affecting the right liver, much of this may be related to asymmetric biliary dilatation in the same distribution. There is also overlying capsular peritoneal disease. Biliary stents remain in normal position. Extensive peritoneal metastatic disease without bowel obstruction. Ascites remains small volume. 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 Electronically Signed   By: Ronnette Coke M.D.   On: 06/07/2023 10:23   CT Angio Chest Pulmonary Embolism (PE) W or WO Contrast Result Date: 05/28/2023 CLINICAL DATA:  met colon cancer, sudden onset CP and SOB 2 days ago. r/o PE. * Tracking Code: BO * EXAM: CT ANGIOGRAPHY CHEST WITH CONTRAST TECHNIQUE: Multidetector CT imaging of the chest was performed using the standard  protocol during bolus administration of intravenous contrast. Multiplanar CT image reconstructions and MIPs were obtained to evaluate the vascular anatomy. RADIATION DOSE REDUCTION: This exam was performed according to the departmental dose-optimization program which includes automated exposure control, adjustment of the mA and/or kV according to patient size and/or use of iterative reconstruction technique. CONTRAST:  75mL OMNIPAQUE  IOHEXOL  350 MG/ML SOLN COMPARISON:  CT scan chest from 01/24/2023. FINDINGS: Cardiovascular: No evidence of embolism to the proximal subsegmental pulmonary artery level. Mild cardiomegaly. No pericardial effusion. No aortic aneurysm.  Mediastinum/Nodes: Visualized thyroid gland appears grossly unremarkable. No solid / cystic mediastinal masses. The esophagus is nondistended precluding optimal assessment. No axillary, mediastinal or hilar lymphadenopathy by size criteria. Lungs/Pleura: The central tracheo-bronchial tree is patent. There are dependent changes and patchy areas of linear, plate-like atelectasis and/or scarring throughout bilateral lungs. Bullous emphysematous changes noted in the bilateral lung apices, right more than left. No mass or consolidation. No pleural effusion or pneumothorax. There are at least 2, sub 4 mm, noncalcified nodules in the left lung lower lobe (marked with electronic arrow sign on series 2008). The relatively larger nodule is seen on series 6, image 90, which appears increased in size since the prior study (prior study from 01/24/2023, series 6, image 92). The smaller nodule along the left major fissure is essentially unchanged (series 6, image 81). In this patient with history of malignancy, findings are concerning for metastases. Attention on follow-up examination is recommended. Upper Abdomen: There are ill-defined subcentimeter sized hypoattenuating foci in the liver (series 4, images 118, 127, 147, etc.), Which are too small to adequately characterize on this exam. Partially seen metallic stent in the bile ducts. Remaining visualized upper abdominal viscera within normal limits. Musculoskeletal: Left-sided PICC line noted with its tip in the cavoatrial junction region. The visualized soft tissues of the chest wall are grossly unremarkable. No suspicious osseous lesions. Review of the MIP images confirms the above findings. IMPRESSION: 1. No embolism to the proximal subsegmental pulmonary artery level. No lung mass, consolidation, pleural effusion or pneumothorax. 2. There are at least 2, sub 4 mm, noncalcified nodules in the left lung lower lobe. The larger nodule appears increased in size since the prior  study. In this patient with history of malignancy, findings are concerning for metastasis. Attention on follow-up examination is recommended. 3. Redemonstration of ill-defined subcentimeter hypoattenuating liver lesions, incompletely characterized on the current exam. 4. Multiple other nonacute observations, as described above. Aortic Atherosclerosis (ICD10-I70.0) and Emphysema (ICD10-J43.9). Electronically Signed   By: Beula Brunswick M.D.   On: 05/28/2023 11:20   CT Soft Tissue Neck W Contrast Result Date: 05/22/2023 CLINICAL DATA:  Jaw pain for 3 days radiating to the clavicle. EXAM: CT NECK WITH CONTRAST TECHNIQUE: Multidetector CT imaging of the neck was performed using the standard protocol following the bolus administration of intravenous contrast. RADIATION DOSE REDUCTION: This exam was performed according to the departmental dose-optimization program which includes automated exposure control, adjustment of the mA and/or kV according to patient size and/or use of iterative reconstruction technique. CONTRAST:  75mL OMNIPAQUE  IOHEXOL  300 MG/ML  SOLN COMPARISON:  12/15/2022 FINDINGS: Pharynx and larynx: No evidence of inflammation or mass. Salivary glands: Small calculus seen on prior no longer detected in the floor of mouth. No acute salivary inflammation seen. Asymmetric fuller right submandibular region with probable sublingual gland herniation through the mylohyoid, this tissue regressed in enhancement since prior. Thyroid: Unremarkable Lymph nodes: None enlarged or heterogeneous. Vascular: Left-sided line with tip at least reaching  the SVC. Limited intracranial: Unremarkable Visualized orbits: Negative Mastoids and visualized paranasal sinuses: Clear Skeleton: No acute or aggressive finding. Anterior cervical spondylitic spurring. Upper chest: Blebs at the apices. IMPRESSION: No explanation for symptoms. Right submandibular ductal calculus is no longer seen and no acute salivary inflammation today.  Electronically Signed   By: Ronnette Coke M.D.   On: 05/22/2023 06:09   DG Chest Portable 1 View Result Date: 05/22/2023 CLINICAL DATA:  PICC line placement. EXAM: PORTABLE CHEST 1 VIEW COMPARISON:  03/09/2023 FINDINGS: Left PICC line tip is positioned over the location of the distal SVC, near the junction with the RA. Lungs are clear. The cardiopericardial silhouette is within normal limits for size. No acute bony abnormality. IMPRESSION: Left PICC line tip is positioned at the level of the distal SVC. Electronically Signed   By: Donnal Fusi M.D.   On: 05/22/2023 05:53    Microbiology: Results for orders placed or performed during the hospital encounter of 03/24/23  Culture, blood (routine x 2)     Status: None   Collection Time: 03/24/23  3:56 PM   Specimen: BLOOD  Result Value Ref Range Status   Specimen Description   Final    BLOOD BLOOD RIGHT FOREARM Performed at [facility], [address] [place], [place] [address]    Special Requests   Final    BOTTLES DRAWN AEROBIC AND ANAEROBIC Blood Culture results may not be optimal due to an inadequate volume of blood received in culture bottles Performed at [facility], [address] [place], [place] [address]    Culture   Final    NO GROWTH 5 DAYS Performed at [facility], [address] [place], [place] [address]    Report Status 03/29/2023 FINAL  Final  Culture, blood (routine x 2)     Status: None   Collection Time: 03/24/23  5:00 PM   Specimen: BLOOD LEFT FOREARM  Result Value Ref Range Status   Specimen Description   Final    BLOOD LEFT FOREARM Performed at [facility], [address] [place], [place] [address]    Special Requests   Final    BOTTLES DRAWN AEROBIC AND ANAEROBIC Blood Culture results may not be optimal due to an inadequate volume of blood received in culture bottles Performed at [facility], [address] [place], [place]  [address]    Culture   Final    NO GROWTH 5 DAYS Performed at [facility], [address], [place], [place] [address]    Report Status 03/29/2023 FINAL  Final   *Note: Due to a large number of results and/or encounters for the requested time period, some results have not been displayed. A complete set of results can be found in Results Review.    Labs: CBC: Recent Labs  Lab 06/04/23 1403 06/07/23 0814  WBC 9.3 7.5  NEUTROABS 5.3 3.8  HGB 11.3* 12.0  HCT 33.9* 37.0  MCV 85.6 89.4  PLT 331 362   Basic Metabolic Panel: Recent Labs  Lab 06/04/23 1403 06/07/23 0814 06/08/23 0500 06/09/23 0555  NA 138 134* 134* 130*  K 2.8* 3.0* 3.2* 3.9  CL 93* 91* 100 94*  CO2 37* 31 26 28   GLUCOSE 88 103* 104* 90  BUN 7 <5* 5* 7  CREATININE 0.43* 0.31* 0.39* 0.38*  CALCIUM  9.4 9.2 8.8* 7.6*  MG  --  2.1  --   --   PHOS  --   --  2.8  --    Liver Function Tests: Recent Labs  Lab 06/04/23 1403 06/07/23 0814  AST 55* 74*  ALT 35 42  ALKPHOS 777* 797*  BILITOT 0.7 1.0  PROT 7.6 7.8  ALBUMIN 3.8 3.5   CBG: Recent Labs  Lab 06/04/23 1508  GLUCAP 89    Discharge time spent: greater than 30 minutes.  Signed: Bobbetta Burnet, MD [facility] 06/09/2023

## 2023-06-09 NOTE — Progress Notes (Signed)
 Called [facility] to transfer her hydromorphone  to [facility], since patient is admitted to [facility] and will be d/c'd today. Confirmed w/[facility] that this is already being done from [facility] end.

## 2023-06-10 ENCOUNTER — Telehealth: Payer: Self-pay

## 2023-06-10 NOTE — Progress Notes (Unsigned)
 [facility]   [phone] Fax:([phone]) [phone]    Patient Care Team: Early, Adriane Albe, NP as PCP - General (Nurse Practitioner) Sumner Ends, MD as Consulting Physician (Oncology) Areta Beer, RN as Case Manager   CHIEF COMPLAINT: Follow up colon cancer   CURRENT THERAPY: Irinotecan  and Panitumumab   INTERVAL HISTORY April [name] returns for follow up as scheduled. Last treatment 05/07/23 while undergoing restaging. She subsequently ran out of pain medication and was admitted for pain management, discharged 5/27.   ROS   Past Medical History:  Diagnosis Date   Ascending aortic aneurysm (HCC) 12/16/2022   Benign essential hypertension 10/06/2017   Last Assessment & Plan:  Formatting of this note might be different from the original. Patient's blood pressure noted to be elevated at today's visit 188/98.  Patient will be provided with antihypertensives in the treatment center if needed in order to meet her Avastin  parameters.  Patient recommended to follow up with her primary care physician regarding medication management.  Patient is not cur   Cancer [facility])    Change in bowel habits 06/28/2022   Colon cancer (HCC)    History of colon cancer 06/28/2022   Hypertension    Prolonged QT interval 01/07/2018   Renal calculi    bilateral   Sepsis due to undetermined organism (HCC) 12/24/2022   Sialoadenitis of submandibular gland 12/16/2022     Past Surgical History:  Procedure Laterality Date   BALLOON DILATION N/A 02/26/2023   Procedure: BALLOON DILATION;  Surgeon: Normie Becton., MD;  Location: [facility] ENDOSCOPY;  Service: Gastroenterology;  Laterality: N/A;   BILIARY BRUSHING  03/24/2022   Procedure: BILIARY BRUSHING;  Surgeon: Brice Campi Albino Alu., MD;  Location: [facility] ENDOSCOPY;  Service: Gastroenterology;;   BILIARY DILATION  03/24/2022   Procedure: BILIARY DILATION;  Surgeon: Normie Becton., MD;  Location: [facility] ENDOSCOPY;  Service:  Gastroenterology;;   BILIARY STENT PLACEMENT  03/24/2022   Procedure: BILIARY STENT PLACEMENT;  Surgeon: Normie Becton., MD;  Location: [facility] ENDOSCOPY;  Service: Gastroenterology;;   BILIARY STENT PLACEMENT N/A 02/26/2023   Procedure: BILIARY STENT PLACEMENT;  Surgeon: Normie Becton., MD;  Location: [facility] ENDOSCOPY;  Service: Gastroenterology;  Laterality: N/A;   BIOPSY  03/24/2022   Procedure: BIOPSY;  Surgeon: Brice Campi Albino Alu., MD;  Location: [facility] ENDOSCOPY;  Service: Gastroenterology;;   BIOPSY  11/23/2022   Procedure: BIOPSY;  Surgeon: Tobin Forts, MD;  Location: [facility] ENDOSCOPY;  Service: Gastroenterology;;   BIOPSY  02/26/2023   Procedure: BIOPSY;  Surgeon: Normie Becton., MD;  Location: [facility] ENDOSCOPY;  Service: Gastroenterology;;   CESAREAN SECTION     x2   COLONOSCOPY N/A 11/23/2022   Procedure: COLONOSCOPY;  Surgeon: Tobin Forts, MD;  Location: [facility] ENDOSCOPY;  Service: Gastroenterology;  Laterality: N/A;   CYSTOSCOPY WITH RETROGRADE PYELOGRAM, URETEROSCOPY AND STENT PLACEMENT Right 10/22/2020   Procedure: CYSTOSCOPY WITH RETROGRADE PYELOGRAM, URETEROSCOPY AND STENT PLACEMENT;  Surgeon: Roxane Copp, MD;  Location: [facility] ORS;  Service: Urology;  Laterality: Right;   ENDOSCOPIC RETROGRADE CHOLANGIOPANCREATOGRAPHY (ERCP) WITH PROPOFOL  N/A 02/26/2023   Procedure: ENDOSCOPIC RETROGRADE CHOLANGIOPANCREATOGRAPHY (ERCP) WITH PROPOFOL ;  Surgeon: Brice Campi Albino Alu., MD;  Location: [facility] ENDOSCOPY;  Service: Gastroenterology;  Laterality: N/A;   ERCP N/A 03/24/2022   Procedure: ENDOSCOPIC RETROGRADE CHOLANGIOPANCREATOGRAPHY (ERCP);  Surgeon: Normie Becton., MD;  Location: [facility] ENDOSCOPY;  Service: Gastroenterology;  Laterality: N/A;   ESOPHAGOGASTRODUODENOSCOPY (EGD) WITH PROPOFOL  N/A 02/26/2023   Procedure: ESOPHAGOGASTRODUODENOSCOPY (EGD) WITH  PROPOFOL ;  Surgeon: Brice Campi Albino Alu., MD;  Location: [facility] ENDOSCOPY;  Service: Gastroenterology;  Laterality: N/A;   patient needs EGD in addition to ERCP   IR PATIENT EVAL TECH 0-60 MINS  12/15/2022   IR PERC CHOLECYSTOSTOMY  03/11/2023   IR RADIOLOGIST EVAL & MGMT  09/16/2019   IR RADIOLOGIST EVAL & MGMT  09/23/2019   IR RADIOLOGIST EVAL & MGMT  09/20/2019   IR RADIOLOGIST EVAL & MGMT  09/30/2019   IR RADIOLOGIST EVAL & MGMT  04/20/2023   IR REMOVAL TUN ACCESS W/ PORT W/O FL MOD SED  09/14/2019   IR VENO/EXT/UNI RIGHT  04/11/2022   PANCREATIC STENT PLACEMENT  03/24/2022   Procedure: PANCREATIC STENT PLACEMENT;  Surgeon: Normie Becton., MD;  Location: [facility] ENDOSCOPY;  Service: Gastroenterology;;   REMOVAL OF STONES  03/24/2022   Procedure: REMOVAL OF STONES;  Surgeon: Normie Becton., MD;  Location[facility];  Service: Gastroenterology;;   REMOVAL OF STONES  02/26/2023   Procedure: REMOVAL OF Sludge;  Surgeon: Normie Becton., MD;  Location: [facility]OSCOPY;  Service: Gastroenterology;;   [name]  03/24/2022   Procedure: [name];  Surgeon: Normie Becton., MD;  Location: [facility] ENDOSCOPY;  Service: Gastroenterology;;   [name] REMOVAL  02/26/2023   Procedure: STENT REMOVAL;  Surgeon: Normie Becton., MD;  Location: [facility] ENDOSCOPY;  Service: Gastroenterology;;   URETERAL STENT PLACEMENT       Outpatient Encounter Medications as of 06/11/2023  Medication Sig Note   amLODipine  (NORVASC ) 10 MG tablet Take 1 tablet (10 mg total) by mouth daily. 06/07/2023: This appears to have NOT been filled since November of last year, but the patient insisted I leave this as "active."   cloNIDine  (CATAPRES ) 0.1 MG tablet Take 3 tablets (0.3 mg total) by mouth 3 (three) times daily. 06/10/2023: Only medications RN CM was able to review before call was ended by patient or dropped with no call back yet.    dicyclomine  (BENTYL ) 20 MG tablet Take 1 tablet (20 mg total) by mouth 3 (three) times daily as needed for spasms (rectal/AB pain).    docusate sodium  (COLACE) 100 MG capsule Take 2 capsules (200  mg total) by mouth daily.    HYDROmorphone  (DILAUDID ) 4 MG tablet Take 1-2 tablets (4-8 mg total) by mouth every 4 (four) hours as needed for severe pain (pain score 7-10) or moderate pain (pain score 4-6).    lactulose  (CHRONULAC ) 10 GM/15ML solution Take 15-30 mLs (10-20 g total) by mouth daily as needed for mild constipation.    metoCLOPramide  (REGLAN ) 10 MG tablet Take 1 tablet (10 mg total) by mouth 4 (four) times daily.    metoprolol  tartrate (LOPRESSOR ) 100 MG tablet Take 1 tablet (100 mg total) by mouth 2 (two) times daily.    pantoprazole  (PROTONIX ) 40 MG tablet Take 1 tablet (40 mg total) by mouth daily before breakfast.    polyethylene glycol powder (GLYCOLAX /MIRALAX ) 17 GM/SCOOP powder Take 17 g by mouth daily as needed.    potassium chloride  (MICRO-K ) 10 MEQ CR capsule Take 2 capsules (20 mEq total) by mouth 2 (two) times daily.    Sodium Chloride  Flush (NORMAL SALINE FLUSH) 0.9 % SOLN Use 5 mLs by Intracatheter route daily in biliary drain at 6 (six) AM as directed (Patient taking differently: 5 mLs by Intracatheter route daily at 6 (six) AM. Use as directed- PICC line; every 3 days)    No facility-administered encounter medications on file as of 06/11/2023.     There  were no vitals filed for this visit. There is no height or weight on file to calculate BMI.   ECOG PERFORMANCE STATUS: {CHL ONC ECOG PS:[phone]}  PHYSICAL EXAM GENERAL:alert, no distress and comfortable SKIN: no rash  EYES: sclera clear NECK: without mass LYMPH:  no palpable cervical or supraclavicular lymphadenopathy  LUNGS: clear with normal breathing effort HEART: regular rate & rhythm, no lower extremity edema ABDOMEN: abdomen soft, non-tender and normal bowel sounds NEURO: alert & oriented x 3 with fluent speech, no focal motor/sensory deficits Breast exam:  PAC without erythema    CBC    Latest Ref Rng & Units 06/07/2023    8:14 AM 06/04/2023    2:03 PM 05/28/2023    9:25 AM  CBC  WBC 4.0 - 10.5  K/uL 7.5  9.3  8.2   Hemoglobin 12.0 - 15.0 g/dL 16.1  09.6  04.5   Hematocrit 36.0 - 46.0 % 37.0  33.9  36.2   Platelets 150 - 400 K/uL 362  331  310       CMP     Latest Ref Rng & Units 06/09/2023    5:55 AM 06/08/2023    5:00 AM 06/07/2023    8:14 AM  CMP  Glucose 70 - 99 mg/dL 90  409  811   BUN 6 - 20 mg/dL 7  5  <5   Creatinine 9.14 - 1.00 mg/dL 7.82  9.56  2.13   Sodium 135 - 145 mmol/L 130  134  134   Potassium 3.5 - 5.1 mmol/L 3.9  3.2  3.0   Chloride 98 - 111 mmol/L 94  100  91   CO2 22 - 32 mmol/L 28  26  31    Calcium  8.9 - 10.3 mg/dL 7.6  8.8  9.2   Total Protein 6.5 - 8.1 g/dL   7.8   Total Bilirubin 0.0 - 1.2 mg/dL   1.0   Alkaline Phos 38 - 126 U/L   797   AST 15 - 41 U/L   74   ALT 0 - 44 U/L   42       ASSESSMENT & PLAN:  PLAN:  No orders of the defined types were placed in this encounter.     All questions were answered. The patient knows to call the clinic with any problems, questions or concerns. No barriers to learning were detected. I spent *** counseling the patient face to face. The total time spent in the appointment was *** and more than 50% was on counseling, review of test results, and coordination of care.   Rithik Odea K Alto Gandolfo, NP 06/10/2023 12:27 PM

## 2023-06-10 NOTE — Transitions of Care (Post Inpatient/ED Visit) (Signed)
 Transition of Care Initial Call Declined Enrollment  Visit Note  06/10/2023  Name: April [name] MRN: [id number]          DOB: 10/10/1975  Situation: Patient declined to enroll in [facility] 30-day program. Visit completed with patient by telephone, but call was dropped or ended by patient before call assessment completed. Attempted to call back and left call back number if patient wished to continue call, but had stated she had already gone over all this information at discharge. Is going to call PCP for HFU today and to check on medication question noted in one medication that was reviewed.   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 Pain. How have you been since you were released from the hospital?: Better Any questions or concerns?: No  Past Medical History:  Diagnosis Date   Ascending aortic aneurysm (HCC) 12/16/2022   Benign essential hypertension 10/06/2017   Last Assessment & Plan:  Formatting of this note might be different from the original. Patient's blood pressure noted to be elevated at today's visit 188/98.  Patient will be provided with antihypertensives in the treatment center if needed in order to meet her Avastin  parameters.  Patient recommended to follow up with her primary care physician regarding medication management.  Patient is not cur   Cancer [facility])    Change in bowel habits 06/28/2022   Colon cancer (HCC)    History of colon cancer 06/28/2022   Hypertension    Prolonged QT interval 01/07/2018   Renal calculi    bilateral   Sepsis due to undetermined organism (HCC) 12/24/2022   Sialoadenitis of submandibular gland 12/16/2022    Assessment: Patient Reported Symptoms: Cognitive Cognitive Status: Able to  follow simple commands, Alert and oriented to person, place, and time, Insightful and able to interpret abstract concepts, Normal speech and language skills      Neurological  Not Assessed, call dropped on ended by patient.     HEENT    Not Assessed, call dropped on ended by patient.     Cardiovascular    Not Assessed, call dropped on ended by patient.   Respiratory  Not Assessed, call dropped on ended by patient.     Endocrine    Not Assessed, call dropped on ended by patient.   Gastrointestinal    No symptoms reported. Abdominal pain improved.   Not Assessed, call dropped on ended by patient.   Genitourinary  Not Assessed, call dropped on ended by patient.     Integumentary    Not Assessed, call dropped on ended by patient.   Musculoskeletal  Not Assessed, call dropped on ended by patient.         Psychosocial  Not Assessed, call dropped on ended by patient.          There were no vitals filed for this visit.  Medications Reviewed Today   Medications were not reviewed in this encounter   Medication reconciliation incomplete as call was dropped on ended by patient. Unable to reach patient again, left call back number. One medication was of concern before call was ended or dropped. See Below.   Patient Medication Order: Clonidine  (Catapres ) 0.1mg  tablet; Take 3 tablets (0.3) by mouth 3 (three) times a daily.  Patient concern:  Patient stated she as given patch in hospital setting and  told to keep on until 06/16/23. Was also prescribed this medications and dose on discharge. Patient unsure if she is to keep patch on and take this medication at the same time. 2. Instructed patient to contact PCP to check on this and to make a HFU appointment. 3. She is monitoring blood pressures at home.   Recommendation:   PCP Follow-up Patient needs HFU and has concerns regarding medication listed above. Patient to call office today.   Follow Up Plan:   No further follow up unless patient  returns call back.    Katheryn Pandy MSN, RN RN Case Sales executive Health  [facility] Office Hours M-F [phone] Direct Dial: [phone] Main Phone [phone]  Fax: [phone] [email or web address]        06/10/2023  Name: April [name] MRN: [id number] DOB: 1975-11-08  Today's TOC FU Call Status: Today's TOC FU Call Status:: Successful TOC FU Call Completed Patient's Name and Date of Birth confirmed.  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 Pain. How have you been since you were released from the hospital?: Better Any questions or concerns?: No  Items Reviewed: Did you receive and understand the discharge instructions provided?: Yes Medications obtained,verified, and reconciled?: No (Call was dropped or patient hung up after stating she had already been over all this information.)    Medications Reviewed Today:Not Assessed,call dropped on ended by patient.  Medications Reviewed Today   Medications were not reviewed in this encounter   Medication reconciliation incomplete as call was dropped on ended by patient. Unable to reach patient again, left call back number. One medication was of concern before call was ended or dropped. See Below.   Patient Medication Order: Clonidine  (Catapres ) 0.1mg  tablet; Take 3 tablets (0.3) by mouth 3 (three) times a daily.  Patient concern:  Patient stated she as given patch in hospital setting and told to keep on until 06/16/23. Was also prescribed this medications and dose on discharge. Patient unsure if she is to keep patch on and take this medication at the same time. 2. Instructed patient to contact PCP to check on this and to make a HFU appointment. 3. She is monitoring blood pressures at home.    Home Care and Equipment/Supplies:Not Assessed, call dropped on ended by patient.     Functional Questionnaire: Do you need assistance with bathing/showering or dressing?: No Do you need assistance with meal preparation?: No Do you need assistance with eating?: No Do you have difficulty maintaining continence: No Do you need assistance with getting out of bed/getting out of a chair/moving?: No Do you have difficulty managing or taking your medications?: No  Follow up appointments reviewed: PCP Follow-up appointment confirmed?:  (Patient will call today to make appt with Archibald Beard) Specialist [facility] Follow-up appointment confirmed?: Yes Date of Specialist follow-up appointment?: 06/11/23 Follow-Up Specialty Provider:: Lacie Burton Do you need transportation to your follow-up appointment?: No Do you understand care options if your condition(s) worsen?: Yes-patient verbalized understanding     Katheryn Pandy MSN, RN RN Case Manager [facility]  [facility] Office Hours M-F [phone] Direct Dial: [phone] Main Phone [phone]  Fax: [phone] .com

## 2023-06-11 ENCOUNTER — Other Ambulatory Visit (HOSPITAL_BASED_OUTPATIENT_CLINIC_OR_DEPARTMENT_OTHER): Payer: Self-pay

## 2023-06-11 ENCOUNTER — Telehealth: Payer: Self-pay

## 2023-06-11 ENCOUNTER — Encounter: Payer: Self-pay | Admitting: Nurse Practitioner

## 2023-06-11 ENCOUNTER — Inpatient Hospital Stay (HOSPITAL_BASED_OUTPATIENT_CLINIC_OR_DEPARTMENT_OTHER): Admitting: Nurse Practitioner

## 2023-06-11 ENCOUNTER — Inpatient Hospital Stay

## 2023-06-11 VITALS — BP 140/86 | HR 86 | Temp 98.1°F | Resp 18 | Wt 131.2 lb

## 2023-06-11 DIAGNOSIS — C19 Malignant neoplasm of rectosigmoid junction: Secondary | ICD-10-CM

## 2023-06-11 DIAGNOSIS — C786 Secondary malignant neoplasm of retroperitoneum and peritoneum: Secondary | ICD-10-CM | POA: Diagnosis present

## 2023-06-11 DIAGNOSIS — I10 Essential (primary) hypertension: Secondary | ICD-10-CM | POA: Diagnosis not present

## 2023-06-11 DIAGNOSIS — R109 Unspecified abdominal pain: Secondary | ICD-10-CM | POA: Diagnosis not present

## 2023-06-11 DIAGNOSIS — C187 Malignant neoplasm of sigmoid colon: Secondary | ICD-10-CM | POA: Diagnosis not present

## 2023-06-11 DIAGNOSIS — C186 Malignant neoplasm of descending colon: Secondary | ICD-10-CM | POA: Diagnosis present

## 2023-06-11 DIAGNOSIS — K6289 Other specified diseases of anus and rectum: Secondary | ICD-10-CM | POA: Diagnosis not present

## 2023-06-11 DIAGNOSIS — E876 Hypokalemia: Secondary | ICD-10-CM | POA: Diagnosis not present

## 2023-06-11 DIAGNOSIS — R112 Nausea with vomiting, unspecified: Secondary | ICD-10-CM | POA: Diagnosis not present

## 2023-06-11 DIAGNOSIS — Z87442 Personal history of urinary calculi: Secondary | ICD-10-CM | POA: Diagnosis not present

## 2023-06-11 LAB — CMP (CANCER CENTER ONLY)
ALT: 28 U/L (ref 0–44)
AST: 47 U/L — ABNORMAL HIGH (ref 15–41)
Albumin: 3.5 g/dL (ref 3.5–5.0)
Alkaline Phosphatase: 839 U/L — ABNORMAL HIGH (ref 38–126)
Anion gap: 11 (ref 5–15)
BUN: 8 mg/dL (ref 6–20)
CO2: 29 mmol/L (ref 22–32)
Calcium: 9.4 mg/dL (ref 8.9–10.3)
Chloride: 96 mmol/L — ABNORMAL LOW (ref 98–111)
Creatinine: 0.47 mg/dL (ref 0.44–1.00)
GFR, Estimated: >60 mL/min (ref >60)
Glucose, Bld: 123 mg/dL — ABNORMAL HIGH (ref 70–99)
Potassium: 3.1 mmol/L — ABNORMAL LOW (ref 3.5–5.1)
Sodium: 136 mmol/L (ref 135–145)
Total Bilirubin: 0.4 mg/dL (ref 0.0–1.2)
Total Protein: 7.1 g/dL (ref 6.5–8.1)

## 2023-06-11 LAB — MAGNESIUM: Magnesium: 1.9 mg/dL (ref 1.7–2.4)

## 2023-06-11 MED ORDER — POTASSIUM CHLORIDE ER 10 MEQ PO CPCR
20.0000 meq | ORAL_CAPSULE | Freq: Two times a day (BID) | ORAL | 2 refills | Status: DC
  Filled 2023-06-11 – 2023-09-25 (×3): qty 120, 30d supply, fill #0

## 2023-06-11 NOTE — Telephone Encounter (Signed)
 Contacted patient to ask if taking Potassium Chloride  2 capsules  ( )  PO two times a day as prescribed. Initially at today's visit stated taking the medication correctly, but now stating not taking consistently. Potassium level 3.1 today, initiated to patient to take the medication as prescribed. Understood. Will notify Edwina Gram NP.

## 2023-06-15 ENCOUNTER — Telehealth: Payer: Self-pay | Admitting: *Deleted

## 2023-06-15 NOTE — Telephone Encounter (Signed)
 She is asking to d/c hydromorphone  and return to oxycodone  10 mg. She feels the hydromorphone  sedates her too much and she is only taking 4 mg (#1 tablet). Would like to pick up on Friday. Suggested she wait and discuss this with NP at her visit on 6/6 before this change is done. She agrees.

## 2023-06-17 IMAGING — CT CT ABD-PELV W/O CM
1 of 2 series · 12 of 32 positions shown, 16 images · non-contrast
Comparison: Restaging CT Chest, Abdomen, and Pelvis 04/25/2020 and
earlier.
COMPARISON: Restaging CT Chest, Abdomen, and Pelvis 04/25/2020 and
earlier.

Addendum:
CLINICAL DATA: 44-year-old female with right lower quadrant and
right flank pain for 2 weeks. History of treated colon cancer.

EXAM:
CT ABDOMEN AND PELVIS WITHOUT CONTRAST
TECHNIQUE: Multidetector CT imaging of the abdomen and pelvis was performed
following the standard protocol without IV contrast.

[Series 2: renal standard/full · axial · 0.75mm/px · z∈[-437,-72]mm · 12 of 87 slices shown, 16 images]
[im 9/87  soft-tissue]
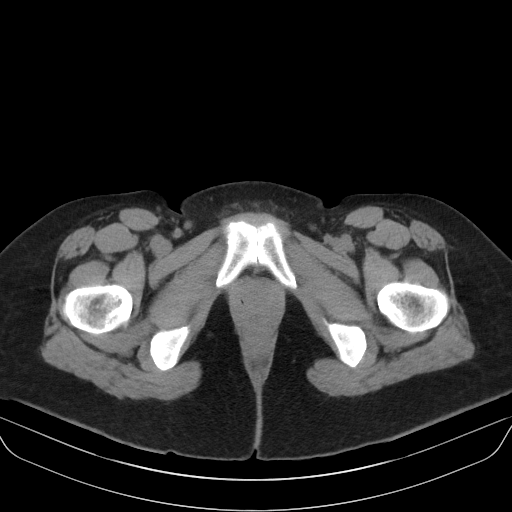
[im 9/87  bone]
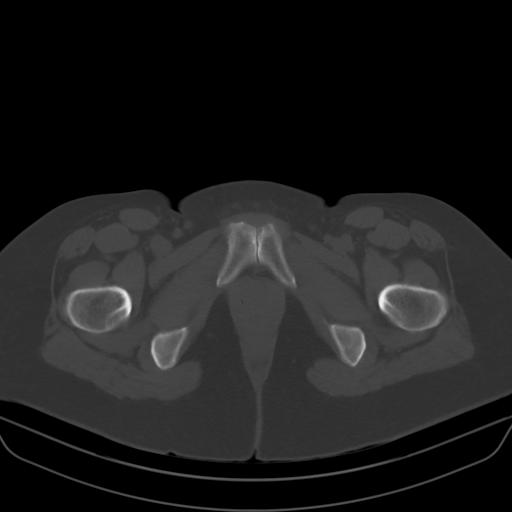
[im 17/87  soft-tissue]
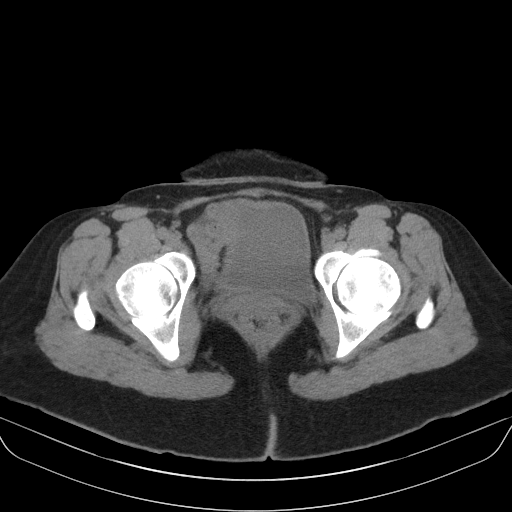
[im 25/87  soft-tissue]
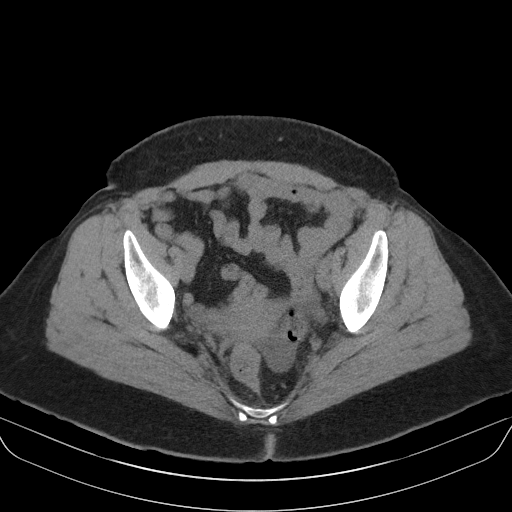
[im 33/87  soft-tissue]
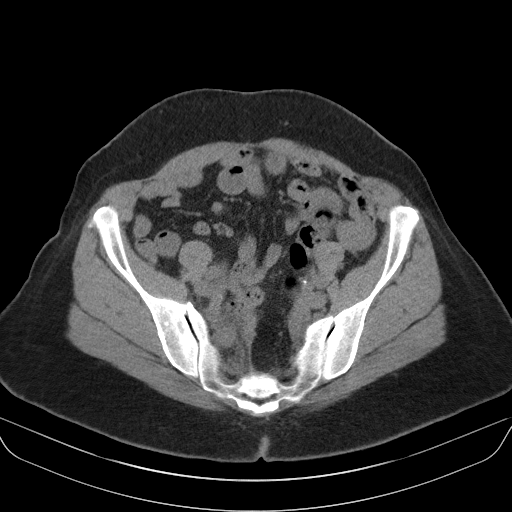
[im 41/87  soft-tissue]
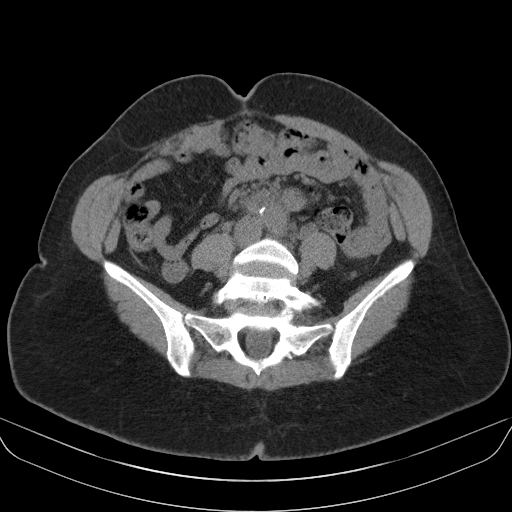
[im 50/87  soft-tissue]
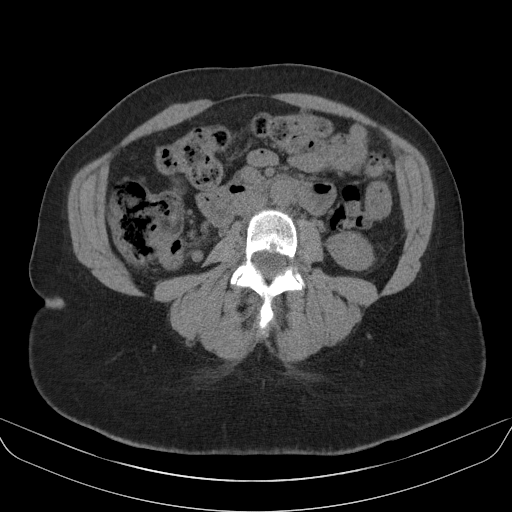
[im 58/87  soft-tissue]
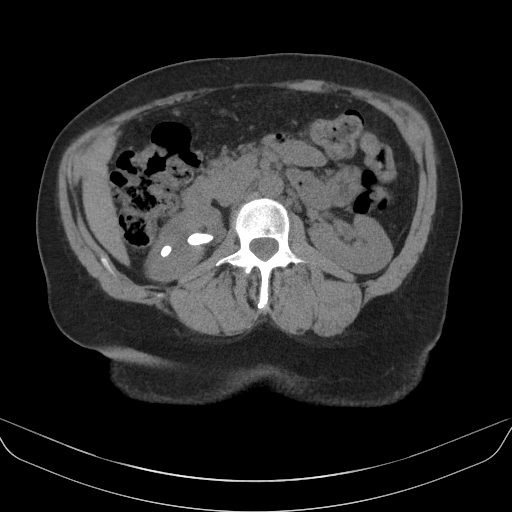
[im 66/87  soft-tissue]
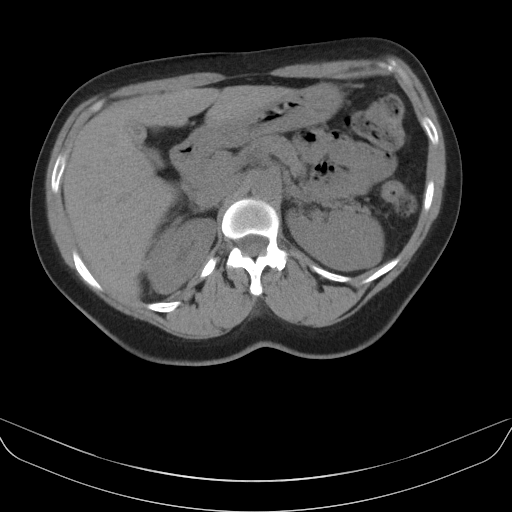
[im 70/87  lung]
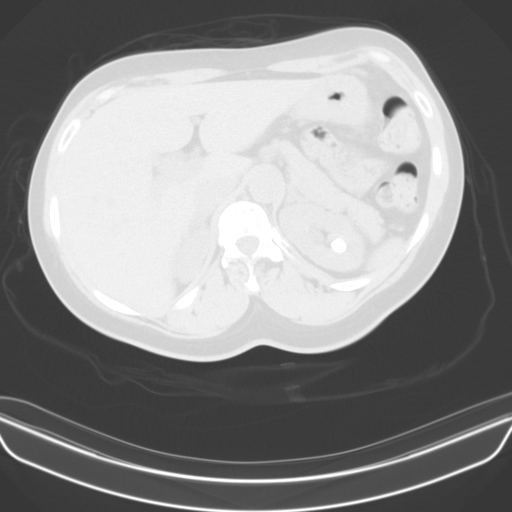
[im 74/87  soft-tissue]
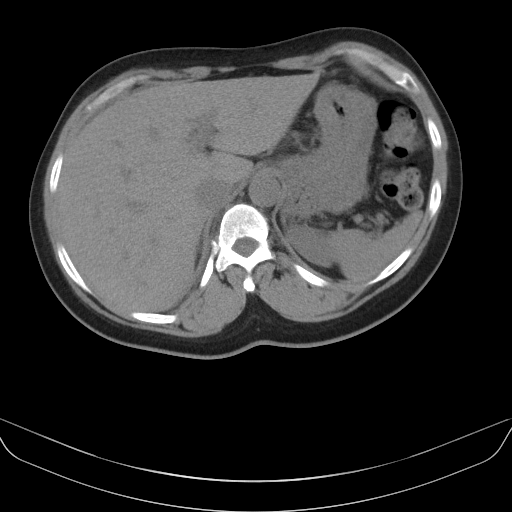
[im 74/87  lung]
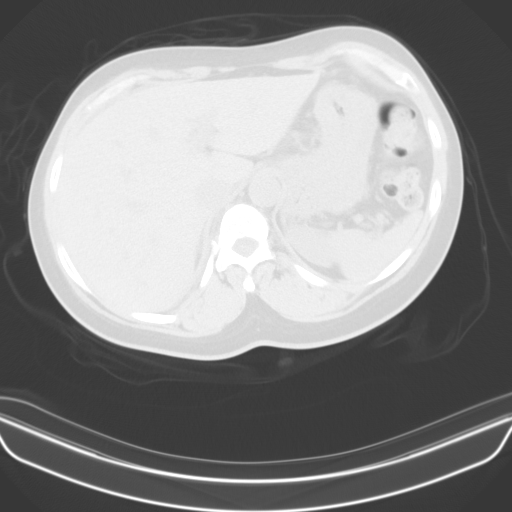
[im 74/87  bone]
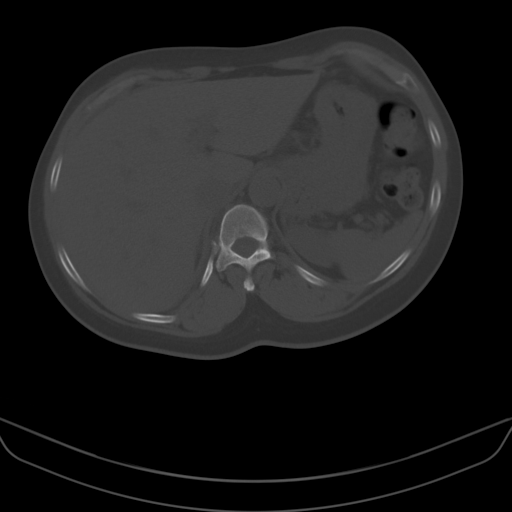
[im 78/87  lung]
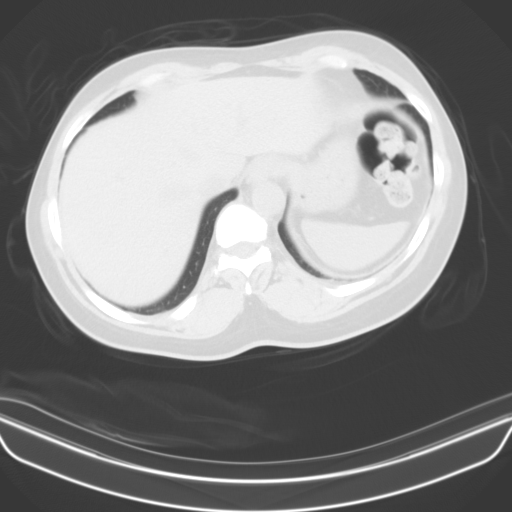
[im 82/87  soft-tissue]
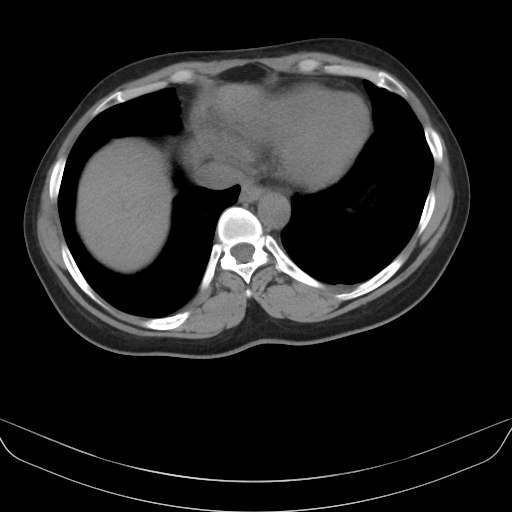
[im 82/87  lung]
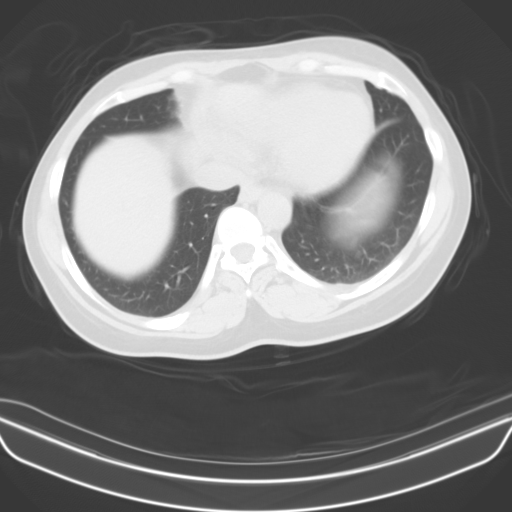

[12 of 32 positions shown; findings below may reference images not displayed]

FINDINGS: Lower chest: Negative lung bases. No pericardial or pleural
effusion.

Hepatobiliary: Contracted gallbladder which might explain the
appearance of wall thickening today. There is no pericholecystic
inflammation (coronal image 21). Negative noncontrast liver.

Pancreas: Negative.

Spleen: Negative.

Adrenals/Urinary Tract: Normal adrenal glands.

Developing staghorn renal calculi. Bulky 2.6 cm calculus of the
right renal pelvis and lower pole collecting system, adjacent 12 mm
lower pole calculus. Other bilateral renal calculi up to 11 mm.
Partial atrophy of the right kidney with no hydronephrosis or acute
renal stranding. Mild asymmetry of the proximal right ureter appears
stable. Unremarkable bladder.

Stomach/Bowel: Rectosigmoid anastomosis appears stable with no
adverse features. Similar retained stool throughout the large bowel.
Redundant transverse colon. Diminutive or absent appendix. The cecum
is on a lax mesentery. No large bowel inflammation. No dilated small
bowel. Terminal ileum appears negative. Decompressed stomach.
Negative duodenum. No free air, free fluid.

Vascular/Lymphatic: Mild Aortoiliac calcified atherosclerosis.
Normal caliber abdominal aorta. Vascular patency is not evaluated in
the absence of IV contrast. No lymphadenopathy is evident.

Reproductive: Negative noncontrast appearance.

Other: Small volume of simple density pelvic free fluid, likely
physiologic on series 2, image 64.

Musculoskeletal: Chronic L5 pars fractures and grade 2
spondylolisthesis at L5-S1. No acute or suspicious osseous lesion
identified.
IMPRESSION: 1. The gallbladder is contracted which likely explains the
appearance of gallbladder wall thickening. There is no
pericholecystic inflammation.

2. Developing staghorn renal calculi, worse on the right where a
bulky 2.6 cm calculus partially involving the right renal pelvis is
stable. No acute hydronephrosis or renal inflammation.

3. Satisfactory appearance of rectosigmoid anastomosis. No acute or
metastatic process identified in the noncontrast abdomen or pelvis.

4. Chronic L5 pars fractures and grade 2 spondylolisthesis at L5-S1.

ADDENDUM:
Study discussed by telephone with Oncologist Dr. Danii Aujla on
08/23/2020 at 4497 hours. He accurately questions the presence of
small but increasing omental nodules on this exam when compared to
the prior CT Chest, Abdomen, and Pelvis 04/25/2020.

The largest of these is in the right upper quadrant on series 2,
image 29, bilobed and measuring 13 mm long axis.

And at least three nearby smaller, round and irregular soft tissue
nodules are also noted on series 2, images 31, 33, 34. All of these
in [DATE] mm or smaller.
Furthermore, he identifies a smaller and more subtly increased soft
tissue nodule of the left lateral conal fascia at the level of the
left lower pararenal space on series 2, image 41.
CONCLUSION: Small but increasing soft tissue nodularity of the right greater
omentum and left lateral conal fascia since Ferienhaus strongly
suggesting Active/Recurrent Metastatic Colon Cancer.

This was discussed with Dr. Lashay.

*** End of Addendum ***
FINDINGS: Lower chest: Negative lung bases. No pericardial or pleural
effusion.

Hepatobiliary: Contracted gallbladder which might explain the
appearance of wall thickening today. There is no pericholecystic
inflammation (coronal image 21). Negative noncontrast liver.

Pancreas: Negative.

Spleen: Negative.

Adrenals/Urinary Tract: Normal adrenal glands.

Developing staghorn renal calculi. Bulky 2.6 cm calculus of the
right renal pelvis and lower pole collecting system, adjacent 12 mm
lower pole calculus. Other bilateral renal calculi up to 11 mm.
Partial atrophy of the right kidney with no hydronephrosis or acute
renal stranding. Mild asymmetry of the proximal right ureter appears
stable. Unremarkable bladder.

Stomach/Bowel: Rectosigmoid anastomosis appears stable with no
adverse features. Similar retained stool throughout the large bowel.
Redundant transverse colon. Diminutive or absent appendix. The cecum
is on a lax mesentery. No large bowel inflammation. No dilated small
bowel. Terminal ileum appears negative. Decompressed stomach.
Negative duodenum. No free air, free fluid.

Vascular/Lymphatic: Mild Aortoiliac calcified atherosclerosis.
Normal caliber abdominal aorta. Vascular patency is not evaluated in
the absence of IV contrast. No lymphadenopathy is evident.

Reproductive: Negative noncontrast appearance.

Other: Small volume of simple density pelvic free fluid, likely
physiologic on series 2, image 64.

Musculoskeletal: Chronic L5 pars fractures and grade 2
spondylolisthesis at L5-S1. No acute or suspicious osseous lesion
identified.
IMPRESSION: 1. The gallbladder is contracted which likely explains the
appearance of gallbladder wall thickening. There is no
pericholecystic inflammation.

2. Developing staghorn renal calculi, worse on the right where a
bulky 2.6 cm calculus partially involving the right renal pelvis is
stable. No acute hydronephrosis or renal inflammation.

3. Satisfactory appearance of rectosigmoid anastomosis. No acute or
metastatic process identified in the noncontrast abdomen or pelvis.

4. Chronic L5 pars fractures and grade 2 spondylolisthesis at L5-S1.

## 2023-06-19 ENCOUNTER — Other Ambulatory Visit (HOSPITAL_BASED_OUTPATIENT_CLINIC_OR_DEPARTMENT_OTHER): Payer: Self-pay

## 2023-06-19 ENCOUNTER — Encounter: Payer: Self-pay | Admitting: Nurse Practitioner

## 2023-06-19 ENCOUNTER — Encounter: Payer: Self-pay | Admitting: Oncology

## 2023-06-19 ENCOUNTER — Inpatient Hospital Stay

## 2023-06-19 ENCOUNTER — Inpatient Hospital Stay: Attending: Oncology | Admitting: Nurse Practitioner

## 2023-06-19 VITALS — BP 134/89 | HR 100 | Temp 97.8°F | Resp 18 | Ht 64.0 in | Wt 127.7 lb

## 2023-06-19 DIAGNOSIS — Z5111 Encounter for antineoplastic chemotherapy: Secondary | ICD-10-CM | POA: Diagnosis present

## 2023-06-19 DIAGNOSIS — G8929 Other chronic pain: Secondary | ICD-10-CM | POA: Diagnosis not present

## 2023-06-19 DIAGNOSIS — R109 Unspecified abdominal pain: Secondary | ICD-10-CM | POA: Diagnosis not present

## 2023-06-19 DIAGNOSIS — C19 Malignant neoplasm of rectosigmoid junction: Secondary | ICD-10-CM

## 2023-06-19 DIAGNOSIS — E876 Hypokalemia: Secondary | ICD-10-CM | POA: Diagnosis not present

## 2023-06-19 DIAGNOSIS — Z5112 Encounter for antineoplastic immunotherapy: Secondary | ICD-10-CM | POA: Diagnosis present

## 2023-06-19 DIAGNOSIS — C187 Malignant neoplasm of sigmoid colon: Secondary | ICD-10-CM | POA: Diagnosis present

## 2023-06-19 DIAGNOSIS — Z79899 Other long term (current) drug therapy: Secondary | ICD-10-CM | POA: Diagnosis not present

## 2023-06-19 DIAGNOSIS — C786 Secondary malignant neoplasm of retroperitoneum and peritoneum: Secondary | ICD-10-CM | POA: Insufficient documentation

## 2023-06-19 DIAGNOSIS — Z79634 Long term (current) use of topoisomerase inhibitor: Secondary | ICD-10-CM | POA: Insufficient documentation

## 2023-06-19 DIAGNOSIS — C20 Malignant neoplasm of rectum: Secondary | ICD-10-CM

## 2023-06-19 DIAGNOSIS — I10 Essential (primary) hypertension: Secondary | ICD-10-CM | POA: Diagnosis not present

## 2023-06-19 LAB — CBC WITH DIFFERENTIAL (CANCER CENTER ONLY)
Abs Immature Granulocytes: 0.02 10*3/uL (ref 0.00–0.07)
Basophils Absolute: 0 10*3/uL (ref 0.0–0.1)
Basophils Relative: 0 %
Eosinophils Absolute: 0.3 10*3/uL (ref 0.0–0.5)
Eosinophils Relative: 3 %
HCT: 33 % — ABNORMAL LOW (ref 36.0–46.0)
Hemoglobin: 10.8 g/dL — ABNORMAL LOW (ref 12.0–15.0)
Immature Granulocytes: 0 %
Lymphocytes Relative: 27 %
Lymphs Abs: 2.9 10*3/uL (ref 0.7–4.0)
MCH: 28.7 pg (ref 26.0–34.0)
MCHC: 32.7 g/dL (ref 30.0–36.0)
MCV: 87.8 fL (ref 80.0–100.0)
Monocytes Absolute: 1 10*3/uL (ref 0.1–1.0)
Monocytes Relative: 9 %
Neutro Abs: 6.5 10*3/uL (ref 1.7–7.7)
Neutrophils Relative %: 61 %
Platelet Count: 387 10*3/uL (ref 150–400)
RBC: 3.76 MIL/uL — ABNORMAL LOW (ref 3.87–5.11)
RDW: 15.7 % — ABNORMAL HIGH (ref 11.5–15.5)
WBC Count: 10.8 10*3/uL — ABNORMAL HIGH (ref 4.0–10.5)
nRBC: 0 % (ref 0.0–0.2)

## 2023-06-19 LAB — CMP (CANCER CENTER ONLY)
ALT: 18 U/L (ref 0–44)
AST: 40 U/L (ref 15–41)
Albumin: 3.3 g/dL — ABNORMAL LOW (ref 3.5–5.0)
Alkaline Phosphatase: 772 U/L — ABNORMAL HIGH (ref 38–126)
Anion gap: 12 (ref 5–15)
BUN: 6 mg/dL (ref 6–20)
CO2: 29 mmol/L (ref 22–32)
Calcium: 9.7 mg/dL (ref 8.9–10.3)
Chloride: 95 mmol/L — ABNORMAL LOW (ref 98–111)
Creatinine: 0.41 mg/dL — ABNORMAL LOW (ref 0.44–1.00)
GFR, Estimated: >60 mL/min (ref >60)
Glucose, Bld: 101 mg/dL — ABNORMAL HIGH (ref 70–99)
Potassium: 3.3 mmol/L — ABNORMAL LOW (ref 3.5–5.1)
Sodium: 135 mmol/L (ref 135–145)
Total Bilirubin: 0.3 mg/dL (ref 0.0–1.2)
Total Protein: 7.8 g/dL (ref 6.5–8.1)

## 2023-06-19 LAB — MAGNESIUM: Magnesium: 1.9 mg/dL (ref 1.7–2.4)

## 2023-06-19 LAB — CEA (ACCESS): CEA (CHCC): 52.6 ng/mL — ABNORMAL HIGH (ref 0.00–5.00)

## 2023-06-19 MED ORDER — SODIUM CHLORIDE 0.9 % IV SOLN: INTRAVENOUS | Status: DC

## 2023-06-19 MED ORDER — PALONOSETRON HCL INJECTION 0.25 MG/5ML
0.2500 mg | Freq: Once | INTRAVENOUS | Status: AC
  Administered 2023-06-19: 0.25 mg via INTRAVENOUS
  Filled 2023-06-19: qty 5

## 2023-06-19 MED ORDER — SODIUM CHLORIDE 0.9% FLUSH: 10.0000 mL | INTRAVENOUS | Status: DC | PRN

## 2023-06-19 MED ORDER — DOCUSATE SODIUM 100 MG PO CAPS
100.0000 mg | ORAL_CAPSULE | Freq: Two times a day (BID) | ORAL | 0 refills | Status: DC
Start: 2023-06-19 — End: 2023-08-15
  Filled 2023-06-19: qty 10, 5d supply, fill #0

## 2023-06-19 MED ORDER — OXYCODONE HCL 5 MG PO TABS
5.0000 mg | ORAL_TABLET | Freq: Once | ORAL | Status: AC
  Administered 2023-06-19: 5 mg via ORAL
  Filled 2023-06-19: qty 1

## 2023-06-19 MED ORDER — SODIUM CHLORIDE 0.9 % IV SOLN
6.0000 mg/kg | Freq: Once | INTRAVENOUS | Status: AC
  Administered 2023-06-19: 360 mg via INTRAVENOUS
  Filled 2023-06-19: qty 18

## 2023-06-19 MED ORDER — HEPARIN SOD (PORK) LOCK FLUSH 100 UNIT/ML IV SOLN
500.0000 [IU] | Freq: Once | INTRAVENOUS | Status: DC | PRN
Start: 2023-06-19 — End: 2023-06-19

## 2023-06-19 MED ORDER — LACTULOSE 10 GM/15ML PO SOLN
10.0000 g | Freq: Every day | ORAL | 2 refills | Status: DC | PRN
  Filled 2023-06-19: qty 236, 7d supply, fill #0

## 2023-06-19 MED ORDER — SODIUM CHLORIDE 0.9 % IV SOLN
125.0000 mg/m2 | Freq: Once | INTRAVENOUS | Status: AC
  Administered 2023-06-19: 200 mg via INTRAVENOUS
  Filled 2023-06-19: qty 10

## 2023-06-19 MED ORDER — SODIUM CHLORIDE 0.9 % IV SOLN
150.0000 mg | Freq: Once | INTRAVENOUS | Status: AC
  Administered 2023-06-19: 150 mg via INTRAVENOUS
  Filled 2023-06-19: qty 150

## 2023-06-19 MED ORDER — DEXAMETHASONE SODIUM PHOSPHATE 10 MG/ML IJ SOLN
10.0000 mg | Freq: Once | INTRAMUSCULAR | Status: AC
  Administered 2023-06-19: 10 mg via INTRAVENOUS
  Filled 2023-06-19: qty 1

## 2023-06-19 MED ORDER — ATROPINE SULFATE 1 MG/ML IV SOLN
0.5000 mg | Freq: Once | INTRAVENOUS | Status: AC
  Administered 2023-06-19: 0.5 mg via INTRAVENOUS
  Filled 2023-06-19: qty 1

## 2023-06-19 MED ORDER — OXYCODONE HCL 5 MG PO TABS
5.0000 mg | ORAL_TABLET | Freq: Four times a day (QID) | ORAL | 0 refills | Status: DC | PRN
  Filled 2023-06-19: qty 60, 8d supply, fill #0

## 2023-06-19 NOTE — Addendum Note (Signed)
 Addended by: Diana Forster K on: 06/19/2023 10:50 AM   Modules accepted: Orders

## 2023-06-19 NOTE — Progress Notes (Signed)
 Patient seen by Lonna Cobb NP today  Vitals are within treatment parameters:Yes   Labs are within treatment parameters: Yes   Treatment plan has been signed: Yes   Per physician team, Patient is ready for treatment and there are NO modifications to the treatment plan.

## 2023-06-19 NOTE — Progress Notes (Signed)
 New treatment plan with updated weight:  Vectibix  6 mg/kg = 360 mg with new weight 57.9 kg  Augie Bliss, PharmD

## 2023-06-19 NOTE — Patient Instructions (Signed)
 [facility] - A DEPT OF [facility]   Discharge Instructions: Thank you for choosing [facility] to provide your oncology and hematology care.   If you have a lab appointment with the Cancer Center, please go directly to the Cancer Center and check in at the registration area.   Wear comfortable clothing and clothing appropriate for easy access to any Portacath or PICC line.   We strive to give you quality time with your provider. You may need to reschedule your appointment if you arrive late (15 or more minutes).  Arriving late affects you and other patients whose appointments are after yours.  Also, if you miss three or more appointments without notifying the office, you may be dismissed from the clinic at the provider's discretion.      For prescription refill requests, have your pharmacy contact our office and allow 72 hours for refills to be completed.    Today you received the following chemotherapy and/or immunotherapy agents VECTIBIX/IRINOTECAN.      To help prevent nausea and vomiting after your treatment, we encourage you to take your nausea medication as directed.  BELOW ARE SYMPTOMS THAT SHOULD BE REPORTED IMMEDIATELY: *FEVER GREATER THAN 100.4 F (38 C) OR HIGHER *CHILLS OR SWEATING *NAUSEA AND VOMITING THAT IS NOT CONTROLLED WITH YOUR NAUSEA MEDICATION *UNUSUAL SHORTNESS OF BREATH *UNUSUAL BRUISING OR BLEEDING *URINARY PROBLEMS (pain or burning when urinating, or frequent urination) *BOWEL PROBLEMS (unusual diarrhea, constipation, pain near the anus) TENDERNESS IN MOUTH AND THROAT WITH OR WITHOUT PRESENCE OF ULCERS (sore throat, sores in mouth, or a toothache) UNUSUAL RASH, SWELLING OR PAIN  UNUSUAL VAGINAL DISCHARGE OR ITCHING   Items with * indicate a potential emergency and should be followed up as soon as possible or go to the Emergency Department if any problems should occur.  Please show the CHEMOTHERAPY ALERT CARD or  IMMUNOTHERAPY ALERT CARD at check-in to the Emergency Department and triage nurse.  Should you have questions after your visit or need to cancel or reschedule your appointment, please contact [facility]  Dept: [phone]  and follow the prompts.  Office hours are 8:00 a.m. to 4:30 p.m. Monday - Friday. Please note that voicemails left after 4:00 p.m. may not be returned until the following business day.  We are closed weekends and major holidays. You have access to a nurse at all times for urgent questions. Please call the main number to the clinic Dept: [phone] and follow the prompts.   For any non-urgent questions, you may also contact your provider using MyChart. We now offer e-Visits for anyone 9 and older to request care online for non-urgent symptoms. For details visit [email or web address].   Also download the MyChart app! Go to the app store, search "MyChart", open the app, select [facility], and log in with your MyChart username and password.  Panitumumab Injection What is this medication? PANITUMUMAB (pan i TOOM ue mab) treats colorectal cancer. It works by blocking a protein that causes cancer cells to grow and multiply. This helps to slow or stop the spread of cancer cells. It is a monoclonal antibody. This medicine may be used for other purposes; ask your health care provider or pharmacist if you have questions. COMMON BRAND NAME(S): Vectibix What should I tell my care team before I take this medication? They need to know if you have any of these conditions: Eye disease Low levels  of magnesium in the blood Lung disease An unusual or allergic reaction to panitumumab, other medications, foods, dyes, or preservatives Pregnant or trying to get pregnant Breast-feeding How should I use this medication? This medication is injected into a vein. It is given by your care team in a hospital or clinic setting. Talk to your  care team about the use of this medication in children. Special care may be needed. Overdosage: If you think you have taken too much of this medicine contact a poison control center or emergency room at once. NOTE: This medicine is only for you. Do not share this medicine with others. What if I miss a dose? Keep appointments for follow-up doses. It is important not to miss your dose. Call your care team if you are unable to keep an appointment. What may interact with this medication? Bevacizumab This list may not describe all possible interactions. Give your health care provider a list of all the medicines, herbs, non-prescription drugs, or dietary supplements you use. Also tell them if you smoke, drink alcohol, or use illegal drugs. Some items may interact with your medicine. What should I watch for while using this medication? Your condition will be monitored carefully while you are receiving this medication. This medication may make you feel generally unwell. This is not uncommon as chemotherapy can affect healthy cells as well as cancer cells. Report any side effects. Continue your course of treatment even though you feel ill unless your care team tells you to stop. This medication can make you more sensitive to the sun. Keep out of the sun while receiving this medication and for 2 months after stopping therapy. If you cannot avoid being in the sun, wear protective clothing and sunscreen. Do not use sun lamps, tanning beds, or tanning booths. Check with your care team if you have severe diarrhea, nausea, and vomiting or if you sweat a lot. The loss of too much body fluid may make it dangerous for you to take this medication. This medication may cause serious skin reactions. They can happen weeks to months after starting the medication. Contact your care team right away if you notice fevers or flu-like symptoms with a rash. The rash may be red or purple and then turn into blisters or peeling of the  skin. You may also notice a red rash with swelling of the face, lips, or lymph nodes in your neck or under your arms. Talk to your care team if you may be pregnant. Serious birth defects can occur if you take this medication during pregnancy and for 2 months after the last dose. Contraception is recommended while taking this medication and for 2 months after the last dose. Your care team can help you find the option that works for you. Do not breastfeed while taking this medication and for 2 months after the last dose. This medication may cause infertility. Talk to your care team if you are concerned about your fertility. What side effects may I notice from receiving this medication? Side effects that you should report to your care team as soon as possible: Allergic reactions--skin rash, itching, hives, swelling of the face, lips, tongue, or throat Dry cough, shortness of breath or trouble breathing Eye pain, redness, irritation, or discharge with blurry or decreased vision Infusion reactions--chest pain, shortness of breath or trouble breathing, feeling faint or lightheaded Low magnesium level--muscle pain or cramps, unusual weakness or fatigue, fast or irregular heartbeat, tremors Low potassium level--muscle pain or cramps, unusual weakness or  fatigue, fast or irregular heartbeat, constipation Redness, blistering, peeling, or loosening of the skin, including inside the mouth Skin reactions on sun-exposed areas Side effects that usually do not require medical attention (report to your care team if they continue or are bothersome): Change in nail shape, thickness, or color Diarrhea Dry skin Fatigue Nausea Vomiting This list may not describe all possible side effects. Call your doctor for medical advice about side effects. You may report side effects to FDA at 1-800-FDA-1088. Where should I keep my medication? This medication is given in a hospital or clinic. It will not be stored at  home. NOTE: This sheet is a summary. It may not cover all possible information. If you have questions about this medicine, talk to your doctor, pharmacist, or health care provider.  2024 Elsevier/Gold Standard (2021-05-15 00:00:00)  Irinotecan Injection What is this medication? IRINOTECAN (ir in oh TEE kan) treats some types of cancer. It works by slowing down the growth of cancer cells. This medicine may be used for other purposes; ask your health care provider or pharmacist if you have questions. COMMON BRAND NAME(S): Camptosar What should I tell my care team before I take this medication? They need to know if you have any of these conditions: Dehydration Diarrhea Infection, especially a viral infection, such as chickenpox, cold sores, herpes Liver disease Low blood cell levels (white cells, red cells, and platelets) Low levels of electrolytes, such as calcium, magnesium, or potassium in your blood Recent or ongoing radiation An unusual or allergic reaction to irinotecan, other medications, foods, dyes, or preservatives If you or your partner are pregnant or trying to get pregnant Breast-feeding How should I use this medication? This medication is injected into a vein. It is given by your care team in a hospital or clinic setting. Talk to your care team about the use of this medication in children. Special care may be needed. Overdosage: If you think you have taken too much of this medicine contact a poison control center or emergency room at once. NOTE: This medicine is only for you. Do not share this medicine with others. What if I miss a dose? Keep appointments for follow-up doses. It is important not to miss your dose. Call your care team if you are unable to keep an appointment. What may interact with this medication? Do not take this medication with any of the following: Cobicistat Itraconazole This medication may also interact with the following: Certain antibiotics, such  as clarithromycin, rifampin, rifabutin Certain antivirals for HIV or AIDS Certain medications for fungal infections, such as ketoconazole, posaconazole, voriconazole Certain medications for seizures, such as carbamazepine, phenobarbital, phenytoin Gemfibrozil Nefazodone St. John's wort This list may not describe all possible interactions. Give your health care provider a list of all the medicines, herbs, non-prescription drugs, or dietary supplements you use. Also tell them if you smoke, drink alcohol, or use illegal drugs. Some items may interact with your medicine. What should I watch for while using this medication? Your condition will be monitored carefully while you are receiving this medication. You may need blood work while taking this medication. This medication may make you feel generally unwell. This is not uncommon as chemotherapy can affect healthy cells as well as cancer cells. Report any side effects. Continue your course of treatment even though you feel ill unless your care team tells you to stop. This medication can cause serious side effects. To reduce the risk, your care team may give you other medications to take  before receiving this one. Be sure to follow the directions from your care team. This medication may affect your coordination, reaction time, or judgement. Do not drive or operate machinery until you know how this medication affects you. Sit up or stand slowly to reduce the risk of dizzy or fainting spells. Drinking alcohol with this medication can increase the risk of these side effects. This medication may increase your risk of getting an infection. Call your care team for advice if you get a fever, chills, sore throat, or other symptoms of a cold or flu. Do not treat yourself. Try to avoid being around people who are sick. Avoid taking medications that contain aspirin, acetaminophen, ibuprofen, naproxen, or ketoprofen unless instructed by your care team. These  medications may hide a fever. This medication may increase your risk to bruise or bleed. Call your care team if you notice any unusual bleeding. Be careful brushing or flossing your teeth or using a toothpick because you may get an infection or bleed more easily. If you have any dental work done, tell your dentist you are receiving this medication. Talk to your care team if you or your partner are pregnant or think either of you might be pregnant. This medication can cause serious birth defects if taken during pregnancy and for 6 months after the last dose. You will need a negative pregnancy test before starting this medication. Contraception is recommended while taking this medication and for 6 months after the last dose. Your care team can help you find the option that works for you. Do not father a child while taking this medication and for 3 months after the last dose. Use a condom for contraception during this time period. Do not breastfeed while taking this medication and for 7 days after the last dose. This medication may cause infertility. Talk to your care team if you are concerned about your fertility. What side effects may I notice from receiving this medication? Side effects that you should report to your care team as soon as possible: Allergic reactions--skin rash, itching, hives, swelling of the face, lips, tongue, or throat Dry cough, shortness of breath or trouble breathing Increased saliva or tears, increased sweating, stomach cramping, diarrhea, small pupils, unusual weakness or fatigue, slow heartbeat Infection--fever, chills, cough, sore throat, wounds that don't heal, pain or trouble when passing urine, general feeling of discomfort or being unwell Kidney injury--decrease in the amount of urine, swelling of the ankles, hands, or feet Low red blood cell level--unusual weakness or fatigue, dizziness, headache, trouble breathing Severe or prolonged diarrhea Unusual bruising or  bleeding Side effects that usually do not require medical attention (report to your care team if they continue or are bothersome): Constipation Diarrhea Hair loss Loss of appetite Nausea Stomach pain This list may not describe all possible side effects. Call your doctor for medical advice about side effects. You may report side effects to FDA at 1-800-FDA-1088. Where should I keep my medication? This medication is given in a hospital or clinic. It will not be stored at home. NOTE: This sheet is a summary. It may not cover all possible information. If you have questions about this medicine, talk to your doctor, pharmacist, or health care provider.  2024 Elsevier/Gold Standard (2021-05-13 00:00:00)

## 2023-06-19 NOTE — Progress Notes (Signed)
 [facility] OFFICE PROGRESS NOTE   Diagnosis: Colon cancer  INTERVAL HISTORY:   April [name] returns as scheduled.  She completed a cycle of irinotecan /Panitumumab  05/08/2023.  She was seen 06/11/2023 with the plan to resume chemotherapy today.  She is constipated.  Last bowel movement 06/11/2023.  She is taking lactulose  and Colace.  Stools previously hard.  Some nausea.  No vomiting.  She reports good fluid intake.  She is passing gas.  Continued abdominal pain.  She feels hydromorphone  is too sedating.  She would like to go back to oxycodone .    Objective:  Vital signs in last 24 hours:  Blood pressure 134/89, pulse 100, temperature 97.8 F (36.6 C), resp. rate 18, height 5\' 4"  (1.626 m), weight 127 lb 11.2 oz (57.9 kg), last menstrual period 01/07/2006, SpO2 100%.    HEENT: No thrush or ulcers. Resp: Lungs clear bilaterally. Cardio: Regular rate and rhythm. GI: Abdomen appears mildly distended.  Bowel sounds present.  Generalized mild tenderness.  Firm fullness right abdomen. Vascular: No leg edema. Neuro: Alert and oriented. PICC site unremarkable.  Lab Results:  Lab Results  Component Value Date   WBC 10.8 (H) 06/19/2023   HGB 10.8 (L) 06/19/2023   HCT 33.0 (L) 06/19/2023   MCV 87.8 06/19/2023   PLT 387 06/19/2023   NEUTROABS 6.5 06/19/2023    Imaging:  No results found.  Medications: I have reviewed the patient's current medications.  Assessment/Plan: Sigmoid colon cancer, stage IV (pT4a,pN2b,M1c) Colonoscopy 03/24/2019-3 rectal polyps-hyperplastic polyps, distal colon biopsy-at least intramucosal adenocarcinoma, completely obstructing mass in the distal sigmoid colon, could not be traversed, intact mismatch repair protein expression 03/24/2019-CEA 56.1 03/29/2019 CT abdomen/pelvis-circumferential thickening involving the entire mid and distal sigmoid colon to the level of the rectosigmoid junction, solid/cystic mass in the left ovary, bilateral  nephrolithiasis Robotic assisted low anterior resection, mesenteric lymphadenectomy, bilateral salpingo-oophorectomy 04/08/2019 Pathology ([facility] review of outside pathology) metastatic adenocarcinoma involving the peritoneum overlying the round ligament, serosa of the urinary bladder, serosa of the right ureter a sacral area, pelvic peritoneum, and left ovary.  Omental biopsy with focal mucin pools with no carcinoma cells identified, right hemidiaphragm biopsy involved by metastatic adenocarcinoma, invasive adenocarcinoma the sigmoid colon, moderately differentiated, T4a, perineural and vascular invasion present, 7/22 lymph nodes, multiple tumor deposits, resection margins negative Negative for PD-L1, low probability of MSI-high, HER-2 negative, negative for BRAF, NRAS and KRAS alterations CTs 05/19/2019-no evidence of metastatic disease, findings suspicious for colitis of the transverse and ascending colon, small amount of ascites in the cul-de-sac, bilateral renal calculi Cycle 1 FOLFIRI 05/24/2019, bevacizumab  added with cycle 2 Cycle 4 FOLFIRI/bevacizumab  07/05/2019 Cycle 5 FOLFIRI 07/27/2019, bevacizumab  held secondary to hypertension Cycle 6 FOLFIRI 08/10/1999, bevacizumab  held secondary to hypertension CTs 08/30/2019-no evidence of recurrent disease, new subsolid right lower lobe nodule felt to be inflammatory, emphysema Cycle 7 FOLFIRI 09/26/2019, bevacizumab  remains on hold secondary to hypertension Cycle 8 FOLFIRI 10/17/2019, bevacizumab  held secondary to hypertension, Udenyca  added for neutropenia Cycle 9 FOLFIRI 11/07/2019, bevacizumab  held secondary to hypertension, Udenyca  Cycle 10 FOLFIRI 11/28/2019, bevacizumab  held, Udenyca  Cycle 11 FOLFIRI 12/26/2019, bevacizumab  held, Udenyca  CTs 01/25/2020-no evidence of recurrent disease, resolution of right lower lobe nodule Maintenance Xeloda  beginning 02/06/2020 CTs 04/25/2020- no evidence of metastatic disease, multiple bilateral renal calculi without  hydronephrosis Maintenance Xeloda  continued CT abdomen/pelvis without contrast 08/10/2020-bilateral staghorn renal calculi, no evidence of metastatic disease; addendum 08/23/2020-small but increasing omental nodules. Cycle 1 FOLFIRI/panitumumab  09/19/2020 Cycle 2 FOLFIRI/Panitumumab  10/03/2020 Cycle 3 FOLFIRI/Panitumumab  10/17/2020  Cycle 4 FOLFIRI/panitumumab  10/31/2020 Cycle 5 FOLFIRI/Panitumumab  11/14/2020 CTs 11/26/2020-stable omental metastases compared to 10/22/2020, mildly decreased from 08/10/2020.  Left lower quadrant tiny paracolic gutter implant slightly decreased from CT 08/10/2020.  No new or progressive metastatic disease in the abdomen or pelvis. Cycle 6 FOLFIRI/Panitumumab  11/28/2020, irinotecan  dose reduced, treatment schedule adjusted to every 3 weeks going forward Cycle 7 FOLFIRI/panitumumab  12/24/2020 Cycle 8 FOLFIRI/Panitumumab  01/16/2021--treatment held due to hypertension in the infusion area. Cycle 8 FOLFIRI/panitumumab  01/21/2021 Cycle 9 FOLFIRI/panitumumab  02/11/2021 Cycle 10 FOLFIRI/Panitumumab  03/04/2021 Cycle 11 FOLFIRI/Panitumumab  03/25/2021 CT abdomen/pelvis 04/10/2021-stable right omental and left posterior paracolic gutter nodules Cycle 12 FOLFIRI/panitumumab  04/15/2021 Cycle 13 FOLFIRI/panitumumab  05/06/2021 Cycle 14 FOLFIRI/Panitumumab  05/27/2021 Cycle 15 FOLFIRI/Panitumumab  06/17/2021 CT abdomen/pelvis 07/05/2021-slight enlargement of a dominant right omental implant, no new implants Patient requested a treatment break CT abdomen/pelvis 10/04/2021-mild increase in size of multiple omental soft tissue nodules, 4 mm calculus in the distal left ureter adjacent to the ureteral stent Cycle 1 Lonsurf  10/14/2021 10/28/2021 Avastin  every 2 weeks Cycle 2 Lonsurf  11/11/2021 Cycle 3 Lonsurf  12/09/2021 Cycle 4 Lonsurf  01/06/2022 Avastin  held 01/20/2022 due to proteinuria, 24-hour urine 43 mg CT/pelvis 01/30/2022-possible new peritoneal implant at the transverse colon, no significant change in  other omental/peritoneal implants, stable chronic rectal wall thickening, status post removal of double-J left ureteral stent with no evidence of hydronephrosis or ureteral calculus, nonobstructing bilateral renal calculi Cycle 5 Lonsurf  02/05/2022 or 02/06/2022 Avastin  every 2 weeks Cycle 6 Lonsurf  03/03/2022 CTs 04/05/2022-increase in size and number of peritoneal implants compared to January 2024 central liver mass narrowing the anterior branch of the right portal vein, decreased biliary duct dilation, biliary stents in place, stable right cardiophrenic lymph nodes, separate pigtail stent in the lumen of the proximal duodenum Cycle 1 FOLFOX 04/14/2022 CT abdomen/pelvis 04/16/2022-stent within the third portion of the duodenum, stable peritoneal nodules, indwelling biliary stents with mild left biliary duct dilation Cycle 2 FOLFOX 04/28/2022, Emend and prophylactic dexamethasone  added Cycle 3 FOLFOX 05/12/2022, Aloxi , Emend, prophylactic dexamethasone , Compazine  and lorazepam  as needed Chemotherapy held 05/25/2020 due to severe hypertension, evaluated in the emergency department Cycle 4 FOLFOX 06/03/2022 Cycle 5 FOLFOX 06/16/2022, oxaliplatin  dose reduced and 5-FU bolus eliminated due to neutropenia CT 06/25/2022-unchanged soft tissue at the hepatic hilum, unchanged, bile duct stent, decreased size of peritoneal and omental nodules and diminished volume of likely fluid in the lower left pelvis Cycle 6 FOLFOX 06/30/2022 Cycle 7 FOLFOX 07/21/2022 CT 07/24/2022 (in the emergency department with nausea, headache, abdominal pain)-no acute pathology.  No significant interval change in omental nodularity. Cycle 8 FOLFOX 08/04/2022, pruritus on the palms at the end of the oxaliplatin  infusion, Pepcid  and Benadryl  given, symptoms resolved, infusion not resumed Cycle 9 FOLFOX 08/18/2022-Pepcid  and Benadryl  added to premedications, Oxaliplatin  diluted in a larger volume and infusion time increased Cycle 10 FOLFOX  09/08/2022 Cycle 11 FOLFOX 09/22/2022 10/06/2022 patient declined treatment CTs 10/15/2022-similar soft tissue fullness in the hepatic hilum with biliary stents in place, decrease in omental/peritoneal metastases Maintenance capecitabine  10/26/2022 CT abdomen/pelvis 11/19/2022-mild increase in tumor studding at the left paracolic gutter and omentum, circumferential wall thickening of the rectum, biliary stents in place with minimal increase in intrahepatic biliary dilation, nonobstructive renal calculi Colonoscopy 11/23/2022-anastomosis at 15 cm from the anal verge-patent, 2 cm polypoid tissue at the anastomosis biopsied, invasive adenocarcinoma moderately differentiated, preserved expression of major MMR proteins CTs 12/15/2022: Possible developing nodule in the lateral left liver, increased omental nodularity, persistent wall thickening in the upper rectum CT abdomen/pelvis 12/24/2022: Increased thickened fold in the  transverse colon-infectious/inflammatory, 2.1 cm lateral left liver lesion, potential new 5 mm segment 5 lesion, stable biliary stents with intrahepatic biliary dilation, central biliary wall thickening near the hepatic hilum, multiple peritoneal/omental implants unchanged from 12/15/2022, but likely larger than 11/19/2022 Cycle 1 irinotecan /Panitumumab  01/06/2023 Cycle 2 irinotecan /Panitumumab  01/20/2023 CTs 01/24/2023-slight increase in omental nodularity. Cycle 3 irinotecan /Panitumumab  02/03/2023 Cycle 4 irinotecan /panitumumab  02/17/2023 CTs abdomen/pelvis 02/24/2023-subtly hypoattenuating lesion within the peripheral aspect of hepatic segment 2 redemonstrated.  Additional scattered subcentimeter low-density lesions within the liver too small to characterize.  Prominent intrahepatic biliary dilatation with biliary stents in place.  Similar degree of nodularity along the omentum and left paracolic gutter.  No ascites. CTs 03/09/2023-indwelling endoscopically placed stents; level of intrahepatic  biliary duct dilatation is slightly improved on the left and some on the right.  Gallbladder is dilated with wall edema which is increasing.  More nodular tissue extending up along the porta hepatis.  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  Rectum with nodular wall thickening with extension into the adjacent tissue, nodular areas seen extending perirectal and presacral spaces.  Peritoneal carcinomatosis again identified. Irinotecan /Panitumumab  04/02/2023 Irinotecan /panitumumab  04/16/2023 Irinotecan /Panitumumab  05/07/2023 Irinotecan /Panitumumab  06/19/2023   Hypertension G4 P3, twins Kidney stones-bilateral staghorn renal calculi on CT 08/10/2020 Infected Port-A-Cath 09/12/2019-placed on Augmentin , referred for Port-A-Cath removal; Port-A-Cath removed 09/14/2019; PICC line placed 09/14/2019; culture staph aureus.  Course of Septra  completed. Neutropenia secondary to chemotherapy-Udenyca  added with cycle 8 FOLFIRI Right nephrostomy tube 09/12/2020; stent placed 10/09/2020, percutaneous nephrostolithotomy treatment of right-sided kidney stones 10/09/2020, malpositioned right ureter stent replacement 10/23/2020, right ureter stent removed 10/29/2020 Percutaneous left nephrostolithotomy 10/01/2021-no renal stone identified, left ureter stent left in place Cystoscopy 10/14/2021-stent removed 8.  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  2 moderate biliary strictures found in the hepatic duct system, malignant appearing.  Left and right hepatic duct and all intrahepatic branches were moderately dilated secondary to the strictures.  The strictures were dilated.  Plastic stent placed into the left hepatic duct and right hepatic duct. 9.  Admission 04/16/2022 with nausea/vomiting and abdominal pain, felt to be acute toxicity related to chemotherapy, discharged home 04/18/2022 10.  Admission 11/19/2022 with nausea/vomiting and abdominal pain-improved 11.  Admission 12/15/2022 with fever and right submandibular swelling/tenderness CT neck 12/15/2022: 2 mm distal right submandibular duct calculus with mild inflammatory change adjacent to the right submandibular gland 12.  Admission 12/24/2022 with a fever nausea/vomiting, diarrhea, Klebsiella bacteremia completed course of Augmentin  13.  Admission with abdominal pain and nausea/vomiting 03/09/2023 through 03/14/2023-concern for gallbladder inflammation.  Not felt to be a surgical candidate.  Percutaneous cholecystostomy tube placed 03/11/2023.  Tube removed 04/24/2023.    Disposition: April [name] appears stable.  She is on active treatment with irinotecan /Panitumumab .  She feels she is benefiting from treatment.  She will complete another cycle today.  CBC and chemistry panel reviewed.  Labs adequate to proceed as above.  She has mild hypokalemia.  She will continue the potassium supplement.  She will adjust her laxative regimen to include MiraLAX .  She will increase Colace to twice daily, continue lactulose .  She will contact the office with persistent constipation, vomiting.  Per her request she will discontinue Dilaudid  and resume oxycodone  as needed for pain.  New prescription sent to the pharmacy.  She will return for follow-up and treatment in 3 weeks.  We are available to see her sooner if needed.    Diana Forster ANP/GNP-BC   06/19/2023  9:59 AM

## 2023-06-20 ENCOUNTER — Other Ambulatory Visit: Payer: Self-pay

## 2023-06-20 ENCOUNTER — Inpatient Hospital Stay (HOSPITAL_COMMUNITY)
Admission: EM | Admit: 2023-06-20 | Discharge: 2023-06-26 | DRG: 948 | Disposition: A | Discharge status: Home / Self Care | Attending: Internal Medicine | Admitting: Internal Medicine

## 2023-06-20 ENCOUNTER — Encounter (HOSPITAL_COMMUNITY): Payer: Self-pay | Admitting: Emergency Medicine

## 2023-06-20 ENCOUNTER — Emergency Department (HOSPITAL_COMMUNITY)

## 2023-06-20 DIAGNOSIS — G893 Neoplasm related pain (acute) (chronic): Principal | ICD-10-CM | POA: Diagnosis present

## 2023-06-20 DIAGNOSIS — Z823 Family history of stroke: Secondary | ICD-10-CM

## 2023-06-20 DIAGNOSIS — Z79899 Other long term (current) drug therapy: Secondary | ICD-10-CM

## 2023-06-20 DIAGNOSIS — C787 Secondary malignant neoplasm of liver and intrahepatic bile duct: Secondary | ICD-10-CM | POA: Diagnosis present

## 2023-06-20 DIAGNOSIS — R079 Chest pain, unspecified: Principal | ICD-10-CM

## 2023-06-20 DIAGNOSIS — I7121 Aneurysm of the ascending aorta, without rupture: Secondary | ICD-10-CM | POA: Diagnosis present

## 2023-06-20 DIAGNOSIS — Z90722 Acquired absence of ovaries, bilateral: Secondary | ICD-10-CM

## 2023-06-20 DIAGNOSIS — N200 Calculus of kidney: Secondary | ICD-10-CM | POA: Diagnosis present

## 2023-06-20 DIAGNOSIS — E861 Hypovolemia: Secondary | ICD-10-CM | POA: Diagnosis present

## 2023-06-20 DIAGNOSIS — C20 Malignant neoplasm of rectum: Secondary | ICD-10-CM | POA: Diagnosis present

## 2023-06-20 DIAGNOSIS — Z515 Encounter for palliative care: Secondary | ICD-10-CM

## 2023-06-20 DIAGNOSIS — J449 Chronic obstructive pulmonary disease, unspecified: Secondary | ICD-10-CM | POA: Diagnosis present

## 2023-06-20 DIAGNOSIS — Z87891 Personal history of nicotine dependence: Secondary | ICD-10-CM

## 2023-06-20 DIAGNOSIS — K5909 Other constipation: Secondary | ICD-10-CM | POA: Diagnosis present

## 2023-06-20 DIAGNOSIS — A419 Sepsis, unspecified organism: Secondary | ICD-10-CM | POA: Diagnosis present

## 2023-06-20 DIAGNOSIS — R000 Tachycardia, unspecified: Secondary | ICD-10-CM | POA: Diagnosis present

## 2023-06-20 DIAGNOSIS — T451X5A Adverse effect of antineoplastic and immunosuppressive drugs, initial encounter: Secondary | ICD-10-CM | POA: Diagnosis present

## 2023-06-20 DIAGNOSIS — K219 Gastro-esophageal reflux disease without esophagitis: Secondary | ICD-10-CM | POA: Diagnosis present

## 2023-06-20 DIAGNOSIS — R112 Nausea with vomiting, unspecified: Secondary | ICD-10-CM

## 2023-06-20 DIAGNOSIS — R188 Other ascites: Secondary | ICD-10-CM | POA: Diagnosis present

## 2023-06-20 DIAGNOSIS — R0682 Tachypnea, not elsewhere classified: Secondary | ICD-10-CM | POA: Diagnosis present

## 2023-06-20 DIAGNOSIS — Z90710 Acquired absence of both cervix and uterus: Secondary | ICD-10-CM

## 2023-06-20 DIAGNOSIS — E876 Hypokalemia: Secondary | ICD-10-CM | POA: Diagnosis present

## 2023-06-20 DIAGNOSIS — Z8249 Family history of ischemic heart disease and other diseases of the circulatory system: Secondary | ICD-10-CM

## 2023-06-20 DIAGNOSIS — Z7189 Other specified counseling: Secondary | ICD-10-CM

## 2023-06-20 DIAGNOSIS — I10 Essential (primary) hypertension: Secondary | ICD-10-CM | POA: Diagnosis present

## 2023-06-20 DIAGNOSIS — R9431 Abnormal electrocardiogram [ECG] [EKG]: Secondary | ICD-10-CM | POA: Diagnosis present

## 2023-06-20 DIAGNOSIS — R1084 Generalized abdominal pain: Secondary | ICD-10-CM

## 2023-06-20 DIAGNOSIS — C786 Secondary malignant neoplasm of retroperitoneum and peritoneum: Secondary | ICD-10-CM | POA: Diagnosis present

## 2023-06-20 DIAGNOSIS — R110 Nausea: Secondary | ICD-10-CM

## 2023-06-20 LAB — PROTIME-INR
INR: 1.2 (ref 0.8–1.2)
Prothrombin Time: 15.3 s — ABNORMAL HIGH (ref 11.4–15.2)

## 2023-06-20 LAB — CBC WITH DIFFERENTIAL/PLATELET
Abs Immature Granulocytes: 0.04 10*3/uL (ref 0.00–0.07)
Basophils Absolute: 0 10*3/uL (ref 0.0–0.1)
Basophils Relative: 0 %
Eosinophils Absolute: 0 10*3/uL (ref 0.0–0.5)
Eosinophils Relative: 0 %
HCT: 35.2 % — ABNORMAL LOW (ref 36.0–46.0)
Hemoglobin: 11.5 g/dL — ABNORMAL LOW (ref 12.0–15.0)
Immature Granulocytes: 0 %
Lymphocytes Relative: 10 %
Lymphs Abs: 1.2 10*3/uL (ref 0.7–4.0)
MCH: 28.3 pg (ref 26.0–34.0)
MCHC: 32.7 g/dL (ref 30.0–36.0)
MCV: 86.7 fL (ref 80.0–100.0)
Monocytes Absolute: 0.3 10*3/uL (ref 0.1–1.0)
Monocytes Relative: 2 %
Neutro Abs: 11.1 10*3/uL — ABNORMAL HIGH (ref 1.7–7.7)
Neutrophils Relative %: 88 %
Platelets: 396 10*3/uL (ref 150–400)
RBC: 4.06 MIL/uL (ref 3.87–5.11)
RDW: 15.6 % — ABNORMAL HIGH (ref 11.5–15.5)
WBC: 12.7 10*3/uL — ABNORMAL HIGH (ref 4.0–10.5)
nRBC: 0 % (ref 0.0–0.2)

## 2023-06-20 LAB — COMPREHENSIVE METABOLIC PANEL WITH GFR
ALT: 18 U/L (ref 0–44)
AST: 32 U/L (ref 15–41)
Albumin: 2.9 g/dL — ABNORMAL LOW (ref 3.5–5.0)
Alkaline Phosphatase: 715 U/L — ABNORMAL HIGH (ref 38–126)
Anion gap: 14 (ref 5–15)
BUN: 8 mg/dL (ref 6–20)
CO2: 30 mmol/L (ref 22–32)
Calcium: 8.9 mg/dL (ref 8.9–10.3)
Chloride: 92 mmol/L — ABNORMAL LOW (ref 98–111)
Creatinine, Ser: 0.43 mg/dL — ABNORMAL LOW (ref 0.44–1.00)
GFR, Estimated: >60 mL/min (ref >60)
Glucose, Bld: 87 mg/dL (ref 70–99)
Potassium: 2.9 mmol/L — ABNORMAL LOW (ref 3.5–5.1)
Sodium: 136 mmol/L (ref 135–145)
Total Bilirubin: 0.8 mg/dL (ref 0.0–1.2)
Total Protein: 7.9 g/dL (ref 6.5–8.1)

## 2023-06-20 LAB — HCG, SERUM, QUALITATIVE: Preg, Serum: NEGATIVE

## 2023-06-20 LAB — MAGNESIUM: Magnesium: 1.8 mg/dL (ref 1.7–2.4)

## 2023-06-20 LAB — I-STAT CG4 LACTIC ACID, ED
Lactic Acid, Venous: 0.9 mmol/L (ref 0.5–1.9)
Lactic Acid, Venous: 0.6 mmol/L (ref 0.5–1.9)

## 2023-06-20 MED ORDER — SODIUM CHLORIDE 0.9 % IV SOLN
2.0000 g | Freq: Once | INTRAVENOUS | Status: AC
  Administered 2023-06-20: 2 g via INTRAVENOUS
  Filled 2023-06-20: qty 12.5

## 2023-06-20 MED ORDER — METRONIDAZOLE 500 MG/100ML IV SOLN
500.0000 mg | Freq: Once | INTRAVENOUS | Status: AC
  Administered 2023-06-20: 500 mg via INTRAVENOUS
  Filled 2023-06-20: qty 100

## 2023-06-20 MED ORDER — VANCOMYCIN HCL IN DEXTROSE 1-5 GM/200ML-% IV SOLN
1000.0000 mg | Freq: Once | INTRAVENOUS | Status: AC
  Administered 2023-06-21: 1000 mg via INTRAVENOUS
  Filled 2023-06-20: qty 200

## 2023-06-20 MED ORDER — POTASSIUM CHLORIDE 10 MEQ/100ML IV SOLN
10.0000 meq | INTRAVENOUS | Status: AC
  Administered 2023-06-20 – 2023-06-21 (×2): 10 meq via INTRAVENOUS
  Filled 2023-06-20 (×2): qty 100

## 2023-06-20 MED ORDER — SODIUM CHLORIDE 0.9 % IV SOLN
12.5000 mg | Freq: Four times a day (QID) | INTRAVENOUS | Status: DC | PRN
  Administered 2023-06-20 – 2023-06-23 (×2): 12.5 mg via INTRAVENOUS
  Filled 2023-06-20 (×2): qty 12.5

## 2023-06-20 MED ORDER — HYDROMORPHONE HCL 1 MG/ML IJ SOLN
1.0000 mg | Freq: Once | INTRAMUSCULAR | Status: AC
  Administered 2023-06-20: 1 mg via INTRAVENOUS
  Filled 2023-06-20: qty 1

## 2023-06-20 MED ORDER — LACTATED RINGERS IV BOLUS (SEPSIS): 1000.0000 mL | Freq: Once | INTRAVENOUS | Status: DC

## 2023-06-20 MED ORDER — LACTATED RINGERS IV SOLN: INTRAVENOUS | Status: DC

## 2023-06-20 MED ORDER — MORPHINE SULFATE (PF) 4 MG/ML IV SOLN
4.0000 mg | Freq: Once | INTRAVENOUS | Status: AC
  Administered 2023-06-20: 4 mg via INTRAVENOUS
  Filled 2023-06-20: qty 1

## 2023-06-20 MED ORDER — POTASSIUM CHLORIDE CRYS ER 20 MEQ PO TBCR
40.0000 meq | EXTENDED_RELEASE_TABLET | Freq: Once | ORAL | Status: AC
  Administered 2023-06-20: 40 meq via ORAL
  Filled 2023-06-20: qty 2

## 2023-06-20 MED ORDER — SODIUM CHLORIDE 0.9 % IV BOLUS
1000.0000 mL | Freq: Once | INTRAVENOUS | Status: AC
Start: 2023-06-20 — End: 2023-06-20
  Administered 2023-06-20: 1000 mL via INTRAVENOUS

## 2023-06-20 NOTE — Sepsis Progress Note (Signed)
 Elink following code sepsis

## 2023-06-20 NOTE — ED Provider Notes (Signed)
 Care of patient assumed from Dr. Carylon Claude.  This patient presented for 1 day of chest and abdominal pain in addition to subjective fevers at home.  She is currently undergoing chemotherapy for rectal cancer.  Although she has been afebrile in the ED, she has had tachycardia and tachypnea.  She was given broad-spectrum antibiotics and IV fluids for empiric treatment of sepsis.  CT scans are currently pending. Physical Exam  BP (!) 164/121   Pulse (!) 112   Temp 99.8 F (37.7 C)   Resp (!) 21   LMP 01/07/2006   SpO2 99%   Physical Exam Vitals and nursing note reviewed.  Constitutional:      General: She is not in acute distress.    Appearance: She is well-developed. She is not ill-appearing, toxic-appearing or diaphoretic.  HENT:     Head: Normocephalic and atraumatic.     Mouth/Throat:     Mouth: Mucous membranes are moist.  Eyes:     Conjunctiva/sclera: Conjunctivae normal.  Cardiovascular:     Rate and Rhythm: Normal rate and regular rhythm.  Pulmonary:     Effort: Pulmonary effort is normal. No respiratory distress.  Abdominal:     General: There is distension.     Tenderness: There is generalized abdominal tenderness.  Musculoskeletal:        General: No swelling.     Cervical back: Neck supple.  Skin:    General: Skin is warm and dry.     Coloration: Skin is not cyanotic or jaundiced.  Neurological:     General: No focal deficit present.     Mental Status: She is alert and oriented to person, place, and time.  Psychiatric:        Mood and Affect: Mood normal.        Behavior: Behavior normal.     Procedures  Procedures  ED Course / MDM    Medical Decision Making Amount and/or Complexity of Data Reviewed Labs: ordered. Radiology: ordered.  Risk OTC drugs. Prescription drug management.   CT scan showed large stool burden.  On assessment, patient's vital signs have improved.  She does have ongoing discomfort.  As needed Dilaudid  was ordered.  She states that  she did undergo chemotherapy on Friday.  Her subjective fevers yesterday may have been chemotherapy reaction.  Out of precaution, patient to be admitted for further management.       Iva Mariner, MD 06/21/23 [phone]

## 2023-06-20 NOTE — ED Triage Notes (Signed)
 Pt BIB [facility] from home c/o ABD pain & chest wall pain, post chemo yesterday, took 5mg  oxy 1pm today, no relief. Pt states this has happened before after chemo tx but not this bad. Endorses nausea, denies fever.   last bp 162/118, hr 118

## 2023-06-20 NOTE — ED Provider Notes (Signed)
 [facility] EMERGENCY DEPARTMENT AT [facility] Provider Note   CSN: [id number] Arrival date & time: 06/20/23  1955     History  Chief Complaint  Patient presents with   Abdominal Pain   Chest Pain    April [name] is a 48 y.o. female.  HPI   48 year old female with diagnosis of rectal cancer undergoing active chemotherapy presents to the emergency department with concern for ongoing chest and abdominal pain associated with nausea.  Patient states she has had reactions similar to this with chemo before but is never been this persistent or severe.  She is complaining of sharp chest pain as well as mild abdominal distention and abdominal pain.  She has had a decreased appetite, unable to tolerate anything p.o. with severe nausea.  Has not had a bowel movement a couple days but continues to pass gas.  Denies any genitourinary symptoms.  She has no productive cough at this time.  Home Medications Prior to Admission medications   Medication Sig Start Date End Date Taking? Authorizing Provider  amLODipine  (NORVASC ) 10 MG tablet Take 1 tablet (10 mg total) by mouth daily. Patient not taking: Reported on 06/11/2023 11/24/22   Armenta Landau, MD  cloNIDine  (CATAPRES ) 0.1 MG tablet Take 3 tablets (0.3 mg total) by mouth 3 (three) times daily. 06/09/23   Shahmehdi, Constantino Demark, MD  dicyclomine  (BENTYL ) 20 MG tablet Take 1 tablet (20 mg total) by mouth 3 (three) times daily as needed for spasms (rectal/AB pain). Patient not taking: Reported on 06/11/2023 02/17/23   Sumner Ends, MD  docusate sodium  (COLACE) 100 MG capsule Take 1 capsule (100 mg total) by mouth 2 (two) times daily. 06/19/23   Roseline Conine, NP  lactulose  (CHRONULAC ) 10 GM/15ML solution Take 15-30 mLs (10-20 g total) by mouth daily as needed for mild constipation. 06/19/23   Thomas, Lisa K, NP  metoprolol  tartrate (LOPRESSOR ) 100 MG tablet Take 1 tablet (100 mg total) by mouth 2 (two) times daily. 06/09/23 07/09/23   Bobbetta Burnet, MD  oxyCODONE  (OXY IR/ROXICODONE ) 5 MG immediate release tablet Take 1-2 tablets (5-10 mg total) by mouth every 6 (six) hours as needed for severe pain (pain score 7-10). 06/19/23   Roseline Conine, NP  pantoprazole  (PROTONIX ) 40 MG tablet Take 1 tablet (40 mg total) by mouth daily before breakfast. 02/27/23   Ozell Blunt, MD  polyethylene glycol powder (GLYCOLAX /MIRALAX ) 17 GM/SCOOP powder Take 17 g by mouth daily as needed. Patient not taking: Reported on 06/19/2023 06/09/23   Bobbetta Burnet, MD  potassium chloride  (MICRO-K ) 10 MEQ CR capsule Take 2 capsules (20 mEq total) by mouth 2 (two) times daily. 06/11/23   Burton, Lacie K, NP  Sodium Chloride  Flush (NORMAL SALINE FLUSH) 0.9 % SOLN Use 5 mLs by Intracatheter route daily in biliary drain at 6 (six) AM as directed Patient taking differently: 5 mLs by Intracatheter route daily at 6 (six) AM. Use as directed- PICC line; every 3 days 03/14/23   [place], Rema Care, DO      Allergies    Lisinopril, Irinotecan , Leucovorin  calcium , and Oxaliplatin     Review of Systems   Review of Systems  Constitutional:  Positive for appetite change, chills and fatigue. Negative for fever.  Respiratory:  Negative for shortness of breath.   Cardiovascular:  Positive for chest pain.  Gastrointestinal:  Positive for abdominal distention, abdominal pain and nausea. Negative for diarrhea and vomiting.  Genitourinary:  Negative for hematuria.  Musculoskeletal:  Negative for neck pain and neck stiffness.  Skin:  Negative for rash.  Neurological:  Negative for headaches.    Physical Exam Updated Vital Signs BP (!) 164/121   Pulse (!) 112   Temp 99.8 F (37.7 C)   Resp (!) 21   LMP 01/07/2006   SpO2 99%  Physical Exam Vitals and nursing note reviewed.  Constitutional:      Appearance: Normal appearance. She is ill-appearing.  HENT:     Head: Normocephalic.     Mouth/Throat:     Mouth: Mucous membranes are moist.  Cardiovascular:      Rate and Rhythm: Normal rate.  Pulmonary:     Effort: Pulmonary effort is normal. No respiratory distress.  Abdominal:     General: Bowel sounds are decreased. There is distension.     Palpations: Abdomen is soft.     Tenderness: There is generalized abdominal tenderness. There is guarding. There is no rebound.  Skin:    General: Skin is warm.  Neurological:     General: No focal deficit present.     Mental Status: She is alert and oriented to person, place, and time. Mental status is at baseline.  Psychiatric:        Mood and Affect: Mood normal.     ED Results / Procedures / Treatments   Labs (all labs ordered are listed, but only abnormal results are displayed) Labs Reviewed  CBC WITH DIFFERENTIAL/PLATELET - Abnormal; Notable for the following components:      Result Value   WBC 12.7 (*)    Hemoglobin 11.5 (*)    HCT 35.2 (*)    RDW 15.6 (*)    Neutro Abs 11.1 (*)    All other components within normal limits  PROTIME-INR - Abnormal; Notable for the following components:   Prothrombin Time 15.3 (*)    All other components within normal limits  CULTURE, BLOOD (ROUTINE X 2)  CULTURE, BLOOD (ROUTINE X 2)  COMPREHENSIVE METABOLIC PANEL WITH GFR  URINALYSIS, W/ REFLEX TO CULTURE (INFECTION SUSPECTED)  HCG, SERUM, QUALITATIVE  MAGNESIUM   I-STAT CG4 LACTIC ACID, ED  I-STAT CG4 LACTIC ACID, ED    EKG EKG Interpretation Date/Time:  Saturday June 20 2023 20:11:09 EDT Ventricular Rate:  122 PR Interval:  39 QRS Duration:  100 QT Interval:  351 QTC Calculation: 501 R Axis:   60  Text Interpretation: Sinus tachycardia Consider right atrial enlargement Left ventricular hypertrophy Borderline T abnormalities, inferior leads Borderline prolonged QT interval Confirmed by Florentino Hurdle [phone]) on 06/20/2023 9:32:29 PM  Radiology DG Chest Port 1 View Result Date: 06/20/2023 CLINICAL DATA:  Chest pain EXAM: PORTABLE CHEST 1 VIEW COMPARISON:  05/22/2023 FINDINGS: The heart size and  mediastinal contours are within normal limits. Both lungs are clear. The visualized skeletal structures are unremarkable. IMPRESSION: No active disease. Electronically Signed   By: Juanetta Nordmann M.D.   On: 06/20/2023 20:49    Procedures .Critical Care  Performed by: Flonnie Humphrey, DO Authorized by: Flonnie Humphrey, DO   Critical care provider statement:    Critical care time (minutes):  45   Critical care time was exclusive of:  Separately billable procedures and treating other patients   Critical care was necessary to treat or prevent imminent or life-threatening deterioration of the following conditions:  Sepsis   Critical care was time spent personally by me on the following activities:  Development of treatment plan with patient or surrogate, discussions with consultants, evaluation of  patient's response to treatment, examination of patient, ordering and review of laboratory studies, ordering and review of radiographic studies, ordering and performing treatments and interventions, pulse oximetry, re-evaluation of patient's condition and review of old charts   I assumed direction of critical care for this patient from another provider in my specialty: no     Care discussed with: admitting provider       Medications Ordered in ED Medications  promethazine  (PHENERGAN ) 12.5 mg in sodium chloride  0.9 % 50 mL IVPB (has no administration in time range)  sodium chloride  0.9 % bolus 1,000 mL (1,000 mLs Intravenous New Bag/Given 06/20/23 2128)  morphine  (PF) 4 MG/ML injection 4 mg (4 mg Intravenous Given 06/20/23 2129)    ED Course/ Medical Decision Making/ A&P                                 Medical Decision Making Amount and/or Complexity of Data Reviewed Labs: ordered. Radiology: ordered.  Risk Prescription drug management.   48 year old female presents emergency department with chest and abdominal pain associated with nausea.  Undergoing active chemotherapy for rectal cancer.   Is tachypneic, tachycardic on arrival, afebrile, ill-appearing.  Abdomen is diffusely tender.  Lung sounds are diminished.  Blood work shows a mild leukocytosis with stable anemia.  She has a hypokalemia of 2.9 with continued elevation of alkaline phosphatase.  Initial lactate is normal.  Urinalysis is pending.  EKG shows sinus tachycardia, prolonged QTc which is baseline for the patient before.  Magnesium  is normal.  Concern for sepsis given her immunocompromise state.  Code sepsis ordered, fluids ordered as well as antibiotics.  Will pursue CT imaging of the chest abdomen pelvis for further evaluation and then plan for admission.  Patient signed out pending CT imaging.        Final Clinical Impression(s) / ED Diagnoses Final diagnoses:  None    Rx / DC Orders ED Discharge Orders     None         Flonnie Humphrey, DO 06/20/23 2305

## 2023-06-21 ENCOUNTER — Other Ambulatory Visit: Payer: Self-pay

## 2023-06-21 ENCOUNTER — Emergency Department (HOSPITAL_COMMUNITY)

## 2023-06-21 DIAGNOSIS — K5909 Other constipation: Secondary | ICD-10-CM | POA: Diagnosis present

## 2023-06-21 DIAGNOSIS — Z7189 Other specified counseling: Secondary | ICD-10-CM | POA: Diagnosis not present

## 2023-06-21 DIAGNOSIS — Z515 Encounter for palliative care: Secondary | ICD-10-CM

## 2023-06-21 DIAGNOSIS — N200 Calculus of kidney: Secondary | ICD-10-CM | POA: Diagnosis present

## 2023-06-21 DIAGNOSIS — R531 Weakness: Secondary | ICD-10-CM | POA: Diagnosis not present

## 2023-06-21 DIAGNOSIS — J449 Chronic obstructive pulmonary disease, unspecified: Secondary | ICD-10-CM | POA: Diagnosis present

## 2023-06-21 DIAGNOSIS — G893 Neoplasm related pain (acute) (chronic): Principal | ICD-10-CM

## 2023-06-21 DIAGNOSIS — R9431 Abnormal electrocardiogram [ECG] [EKG]: Secondary | ICD-10-CM

## 2023-06-21 DIAGNOSIS — E861 Hypovolemia: Secondary | ICD-10-CM | POA: Diagnosis present

## 2023-06-21 DIAGNOSIS — K219 Gastro-esophageal reflux disease without esophagitis: Secondary | ICD-10-CM | POA: Diagnosis present

## 2023-06-21 DIAGNOSIS — E876 Hypokalemia: Secondary | ICD-10-CM

## 2023-06-21 DIAGNOSIS — I10 Essential (primary) hypertension: Secondary | ICD-10-CM | POA: Diagnosis present

## 2023-06-21 DIAGNOSIS — I7121 Aneurysm of the ascending aorta, without rupture: Secondary | ICD-10-CM | POA: Diagnosis present

## 2023-06-21 DIAGNOSIS — R112 Nausea with vomiting, unspecified: Secondary | ICD-10-CM

## 2023-06-21 DIAGNOSIS — C787 Secondary malignant neoplasm of liver and intrahepatic bile duct: Secondary | ICD-10-CM | POA: Diagnosis present

## 2023-06-21 DIAGNOSIS — R110 Nausea: Secondary | ICD-10-CM | POA: Diagnosis not present

## 2023-06-21 DIAGNOSIS — Z79899 Other long term (current) drug therapy: Secondary | ICD-10-CM | POA: Diagnosis not present

## 2023-06-21 DIAGNOSIS — Z8249 Family history of ischemic heart disease and other diseases of the circulatory system: Secondary | ICD-10-CM | POA: Diagnosis not present

## 2023-06-21 DIAGNOSIS — R000 Tachycardia, unspecified: Secondary | ICD-10-CM | POA: Diagnosis present

## 2023-06-21 DIAGNOSIS — C20 Malignant neoplasm of rectum: Secondary | ICD-10-CM

## 2023-06-21 DIAGNOSIS — T451X5A Adverse effect of antineoplastic and immunosuppressive drugs, initial encounter: Secondary | ICD-10-CM | POA: Diagnosis present

## 2023-06-21 DIAGNOSIS — Z87891 Personal history of nicotine dependence: Secondary | ICD-10-CM | POA: Diagnosis not present

## 2023-06-21 DIAGNOSIS — A419 Sepsis, unspecified organism: Secondary | ICD-10-CM

## 2023-06-21 DIAGNOSIS — R188 Other ascites: Secondary | ICD-10-CM | POA: Diagnosis present

## 2023-06-21 DIAGNOSIS — Z823 Family history of stroke: Secondary | ICD-10-CM | POA: Diagnosis not present

## 2023-06-21 DIAGNOSIS — C786 Secondary malignant neoplasm of retroperitoneum and peritoneum: Secondary | ICD-10-CM | POA: Diagnosis present

## 2023-06-21 DIAGNOSIS — R0682 Tachypnea, not elsewhere classified: Secondary | ICD-10-CM | POA: Diagnosis present

## 2023-06-21 LAB — BASIC METABOLIC PANEL WITH GFR
Anion gap: 9 (ref 5–15)
BUN: 7 mg/dL (ref 6–20)
CO2: 29 mmol/L (ref 22–32)
Calcium: 8.5 mg/dL — ABNORMAL LOW (ref 8.9–10.3)
Chloride: 95 mmol/L — ABNORMAL LOW (ref 98–111)
Creatinine, Ser: 0.37 mg/dL — ABNORMAL LOW (ref 0.44–1.00)
GFR, Estimated: >60 mL/min (ref >60)
Glucose, Bld: 83 mg/dL (ref 70–99)
Potassium: 3.4 mmol/L — ABNORMAL LOW (ref 3.5–5.1)
Sodium: 133 mmol/L — ABNORMAL LOW (ref 135–145)

## 2023-06-21 LAB — BLOOD CULTURE ID PANEL (REFLEXED) - BCID2

## 2023-06-21 LAB — URINALYSIS, W/ REFLEX TO CULTURE (INFECTION SUSPECTED)
Bacteria, UA: NONE SEEN
Bilirubin Urine: NEGATIVE
Glucose, UA: NEGATIVE mg/dL
Ketones, ur: NEGATIVE mg/dL
Leukocytes,Ua: NEGATIVE
Nitrite: NEGATIVE
Protein, ur: NEGATIVE mg/dL
Specific Gravity, Urine: 1.036 — ABNORMAL HIGH (ref 1.005–1.030)
pH: 6 (ref 5.0–8.0)

## 2023-06-21 LAB — HEPATIC FUNCTION PANEL
ALT: 16 U/L (ref 0–44)
AST: 25 U/L (ref 15–41)
Albumin: 2.6 g/dL — ABNORMAL LOW (ref 3.5–5.0)
Alkaline Phosphatase: 606 U/L — ABNORMAL HIGH (ref 38–126)
Bilirubin, Direct: <0.1 mg/dL (ref 0.0–0.2)
Total Bilirubin: 0.7 mg/dL (ref 0.0–1.2)
Total Protein: 6.9 g/dL (ref 6.5–8.1)

## 2023-06-21 LAB — CBC
HCT: 31.6 % — ABNORMAL LOW (ref 36.0–46.0)
Hemoglobin: 10.3 g/dL — ABNORMAL LOW (ref 12.0–15.0)
MCH: 28.8 pg (ref 26.0–34.0)
MCHC: 32.6 g/dL (ref 30.0–36.0)
MCV: 88.3 fL (ref 80.0–100.0)
Platelets: 311 10*3/uL (ref 150–400)
RBC: 3.58 MIL/uL — ABNORMAL LOW (ref 3.87–5.11)
RDW: 15.5 % (ref 11.5–15.5)
WBC: 9.9 10*3/uL (ref 4.0–10.5)
nRBC: 0 % (ref 0.0–0.2)

## 2023-06-21 LAB — LIPASE, BLOOD: Lipase: 44 U/L (ref 11–51)

## 2023-06-21 LAB — PHOSPHORUS: Phosphorus: 3 mg/dL (ref 2.5–4.6)

## 2023-06-21 LAB — TROPONIN I (HIGH SENSITIVITY): Troponin I (High Sensitivity): 12 ng/L (ref <18)

## 2023-06-21 MED ORDER — SODIUM CHLORIDE 0.9 % IV SOLN
2.0000 g | INTRAVENOUS | Status: DC
  Administered 2023-06-21 – 2023-06-22 (×2): 2 g via INTRAVENOUS
  Filled 2023-06-21 (×3): qty 20

## 2023-06-21 MED ORDER — NALOXONE HCL 0.4 MG/ML IJ SOLN
0.4000 mg | INTRAMUSCULAR | Status: DC | PRN
Start: 2023-06-21 — End: 2023-06-24

## 2023-06-21 MED ORDER — VANCOMYCIN HCL IN DEXTROSE 1-5 GM/200ML-% IV SOLN
1000.0000 mg | INTRAVENOUS | Status: DC
  Administered 2023-06-21: 1000 mg via INTRAVENOUS
  Filled 2023-06-21: qty 200

## 2023-06-21 MED ORDER — MAGNESIUM SULFATE 2 GM/50ML IV SOLN
2.0000 g | Freq: Once | INTRAVENOUS | Status: AC
  Administered 2023-06-21: 2 g via INTRAVENOUS
  Filled 2023-06-21: qty 50

## 2023-06-21 MED ORDER — HYDROMORPHONE 1 MG/ML IV SOLN
INTRAVENOUS | Status: DC
  Administered 2023-06-21: 30 mg via INTRAVENOUS
  Filled 2023-06-21: qty 30

## 2023-06-21 MED ORDER — POLYETHYLENE GLYCOL 3350 17 G PO PACK: 17.0000 g | PACK | Freq: Every day | ORAL | Status: DC | PRN

## 2023-06-21 MED ORDER — CLONIDINE HCL 0.2 MG PO TABS
0.3000 mg | ORAL_TABLET | Freq: Three times a day (TID) | ORAL | Status: DC
  Administered 2023-06-21 – 2023-06-26 (×16): 0.3 mg via ORAL
  Filled 2023-06-21 (×16): qty 1

## 2023-06-21 MED ORDER — CHLORHEXIDINE GLUCONATE CLOTH 2 % EX PADS
6.0000 | MEDICATED_PAD | Freq: Every day | CUTANEOUS | Status: DC
  Administered 2023-06-21 – 2023-06-26 (×6): 6 via TOPICAL

## 2023-06-21 MED ORDER — PANTOPRAZOLE SODIUM 40 MG PO TBEC
40.0000 mg | DELAYED_RELEASE_TABLET | Freq: Two times a day (BID) | ORAL | Status: DC
  Administered 2023-06-21 – 2023-06-26 (×10): 40 mg via ORAL
  Filled 2023-06-21 (×10): qty 1

## 2023-06-21 MED ORDER — HYDROMORPHONE 1 MG/ML IV SOLN
INTRAVENOUS | Status: DC
  Administered 2023-06-21: 2.4 mg via INTRAVENOUS
  Administered 2023-06-22: 1 mg via INTRAVENOUS
  Administered 2023-06-22: 3 mg via INTRAVENOUS
  Administered 2023-06-22: 4.4 mg via INTRAVENOUS
  Administered 2023-06-22: 2.6 mg via INTRAVENOUS
  Administered 2023-06-22: 1.2 mg via INTRAVENOUS
  Administered 2023-06-22: 1.8 mg via INTRAVENOUS
  Administered 2023-06-23: 4 mg via INTRAVENOUS
  Administered 2023-06-23: 3.4 mL via INTRAVENOUS
  Administered 2023-06-23: 1.8 mg via INTRAVENOUS
  Administered 2023-06-23: 60 mg via INTRAVENOUS
  Administered 2023-06-23: 1.4 mg via INTRAVENOUS
  Administered 2023-06-23: 2 mg via INTRAVENOUS
  Administered 2023-06-24: 2.8 mg via INTRAVENOUS
  Administered 2023-06-24: 1.6 mg via INTRAVENOUS
  Administered 2023-06-24: 3.2 mg via INTRAVENOUS
  Administered 2023-06-24: 2.2 mg via INTRAVENOUS
  Filled 2023-06-21: qty 30

## 2023-06-21 MED ORDER — ALUM & MAG HYDROXIDE-SIMETH 200-200-20 MG/5ML PO SUSP
30.0000 mL | Freq: Once | ORAL | Status: AC
  Administered 2023-06-21: 30 mL via ORAL
  Filled 2023-06-21: qty 30

## 2023-06-21 MED ORDER — AMLODIPINE BESYLATE 5 MG PO TABS
10.0000 mg | ORAL_TABLET | Freq: Every day | ORAL | Status: DC
  Administered 2023-06-21 – 2023-06-26 (×6): 10 mg via ORAL
  Filled 2023-06-21 (×7): qty 2

## 2023-06-21 MED ORDER — SENNA 8.6 MG PO TABS
2.0000 | ORAL_TABLET | Freq: Every day | ORAL | Status: DC
  Administered 2023-06-21 – 2023-06-25 (×4): 17.2 mg via ORAL
  Filled 2023-06-21 (×5): qty 2

## 2023-06-21 MED ORDER — HYDROMORPHONE HCL 1 MG/ML IJ SOLN
1.0000 mg | INTRAMUSCULAR | Status: DC | PRN
  Administered 2023-06-21: 1 mg via INTRAVENOUS
  Filled 2023-06-21 (×2): qty 1

## 2023-06-21 MED ORDER — ALUM & MAG HYDROXIDE-SIMETH 200-200-20 MG/5ML PO SUSP: 30.0000 mL | Freq: Four times a day (QID) | ORAL | Status: DC | PRN

## 2023-06-21 MED ORDER — IOHEXOL 350 MG/ML SOLN
75.0000 mL | Freq: Once | INTRAVENOUS | Status: AC | PRN
  Administered 2023-06-21: 75 mL via INTRAVENOUS

## 2023-06-21 MED ORDER — OXYCODONE HCL 5 MG PO TABS
10.0000 mg | ORAL_TABLET | ORAL | Status: DC | PRN
  Administered 2023-06-21: 10 mg via ORAL
  Filled 2023-06-21: qty 2

## 2023-06-21 MED ORDER — OXYCODONE HCL 5 MG PO TABS: 15.0000 mg | ORAL_TABLET | ORAL | Status: DC | PRN

## 2023-06-21 MED ORDER — LACTATED RINGERS IV SOLN: INTRAVENOUS | Status: DC

## 2023-06-21 MED ORDER — LACTATED RINGERS IV SOLN: INTRAVENOUS | Status: AC

## 2023-06-21 MED ORDER — SUCRALFATE 1 GM/10ML PO SUSP
1.0000 g | Freq: Three times a day (TID) | ORAL | Status: DC
  Administered 2023-06-21 – 2023-06-26 (×21): 1 g via ORAL
  Filled 2023-06-21 (×22): qty 10

## 2023-06-21 MED ORDER — ACETAMINOPHEN 500 MG PO TABS
1000.0000 mg | ORAL_TABLET | Freq: Four times a day (QID) | ORAL | Status: DC | PRN
  Administered 2023-06-25 – 2023-06-26 (×2): 1000 mg via ORAL
  Filled 2023-06-21 (×2): qty 2

## 2023-06-21 MED ORDER — LACTULOSE 10 GM/15ML PO SOLN: 10.0000 g | Freq: Every day | ORAL | Status: DC | PRN

## 2023-06-21 MED ORDER — IPRATROPIUM-ALBUTEROL 0.5-2.5 (3) MG/3ML IN SOLN: 3.0000 mL | Freq: Four times a day (QID) | RESPIRATORY_TRACT | Status: DC | PRN

## 2023-06-21 MED ORDER — LORAZEPAM 2 MG/ML IJ SOLN: 0.5000 mg | Freq: Four times a day (QID) | INTRAMUSCULAR | Status: DC | PRN

## 2023-06-21 MED ORDER — SODIUM CHLORIDE 0.9% FLUSH
3.0000 mL | Freq: Two times a day (BID) | INTRAVENOUS | Status: DC
  Administered 2023-06-21 – 2023-06-26 (×9): 3 mL via INTRAVENOUS

## 2023-06-21 MED ORDER — OXYCODONE HCL 5 MG PO TABS: 10.0000 mg | ORAL_TABLET | ORAL | Status: DC | PRN

## 2023-06-21 MED ORDER — MELATONIN 3 MG PO TABS
6.0000 mg | ORAL_TABLET | Freq: Every evening | ORAL | Status: DC | PRN
  Administered 2023-06-25: 6 mg via ORAL
  Filled 2023-06-21: qty 2

## 2023-06-21 MED ORDER — POTASSIUM CHLORIDE 10 MEQ/100ML IV SOLN
10.0000 meq | INTRAVENOUS | Status: AC
  Administered 2023-06-21 (×2): 10 meq via INTRAVENOUS
  Filled 2023-06-21 (×2): qty 100

## 2023-06-21 MED ORDER — DEXAMETHASONE 4 MG PO TABS
4.0000 mg | ORAL_TABLET | Freq: Every day | ORAL | Status: AC
  Administered 2023-06-21 – 2023-06-23 (×3): 4 mg via ORAL
  Filled 2023-06-21 (×3): qty 1

## 2023-06-21 MED ORDER — METRONIDAZOLE 500 MG/100ML IV SOLN
500.0000 mg | Freq: Two times a day (BID) | INTRAVENOUS | Status: DC
  Administered 2023-06-21 – 2023-06-22 (×3): 500 mg via INTRAVENOUS
  Filled 2023-06-21 (×3): qty 100

## 2023-06-21 MED ORDER — SODIUM CHLORIDE 0.9% FLUSH: 10.0000 mL | INTRAVENOUS | Status: DC | PRN

## 2023-06-21 MED ORDER — SODIUM CHLORIDE 0.9% FLUSH: 9.0000 mL | INTRAVENOUS | Status: DC | PRN

## 2023-06-21 MED ORDER — METOPROLOL TARTRATE 50 MG PO TABS
100.0000 mg | ORAL_TABLET | Freq: Two times a day (BID) | ORAL | Status: DC
  Administered 2023-06-21 – 2023-06-26 (×10): 100 mg via ORAL
  Filled 2023-06-21 (×10): qty 2

## 2023-06-21 MED ORDER — OXYCODONE HCL 5 MG PO TABS: 5.0000 mg | ORAL_TABLET | ORAL | Status: DC | PRN

## 2023-06-21 MED ORDER — HYDROMORPHONE HCL 1 MG/ML IJ SOLN
1.0000 mg | INTRAMUSCULAR | Status: DC | PRN
  Administered 2023-06-21 – 2023-06-23 (×3): 1 mg via INTRAVENOUS
  Filled 2023-06-21 (×3): qty 1

## 2023-06-21 MED ORDER — DICYCLOMINE HCL 20 MG PO TABS
20.0000 mg | ORAL_TABLET | Freq: Three times a day (TID) | ORAL | Status: DC | PRN
  Administered 2023-06-22 – 2023-06-26 (×8): 20 mg via ORAL
  Filled 2023-06-21 (×8): qty 1

## 2023-06-21 MED ORDER — BISACODYL 10 MG RE SUPP: 10.0000 mg | Freq: Every day | RECTAL | Status: DC | PRN

## 2023-06-21 MED ORDER — LACTULOSE 10 GM/15ML PO SOLN
20.0000 g | Freq: Two times a day (BID) | ORAL | Status: DC
  Administered 2023-06-21 – 2023-06-26 (×8): 20 g via ORAL
  Filled 2023-06-21 (×10): qty 30

## 2023-06-21 MED ORDER — ALBUTEROL SULFATE (2.5 MG/3ML) 0.083% IN NEBU: 2.5000 mg | INHALATION_SOLUTION | RESPIRATORY_TRACT | Status: DC | PRN

## 2023-06-21 MED ORDER — HYDROMORPHONE HCL 1 MG/ML IJ SOLN
0.5000 mg | INTRAMUSCULAR | Status: DC | PRN
  Administered 2023-06-21: 0.5 mg via INTRAVENOUS
  Filled 2023-06-21: qty 0.5

## 2023-06-21 MED ORDER — HYDROMORPHONE HCL 4 MG PO TABS: 4.0000 mg | ORAL_TABLET | ORAL | Status: DC | PRN

## 2023-06-21 MED ORDER — LIDOCAINE VISCOUS HCL 2 % MT SOLN
15.0000 mL | Freq: Once | OROMUCOSAL | Status: AC
  Administered 2023-06-21: 15 mL via ORAL
  Filled 2023-06-21: qty 15

## 2023-06-21 MED ORDER — BOOST / RESOURCE BREEZE PO LIQD CUSTOM
1.0000 | Freq: Three times a day (TID) | ORAL | Status: DC
  Administered 2023-06-22 – 2023-06-25 (×10): 1 via ORAL

## 2023-06-21 MED ORDER — HYDROMORPHONE 1 MG/ML IV SOLN: INTRAVENOUS | Status: DC

## 2023-06-21 MED ORDER — PANTOPRAZOLE SODIUM 40 MG PO TBEC
40.0000 mg | DELAYED_RELEASE_TABLET | Freq: Every day | ORAL | Status: DC
  Administered 2023-06-21: 40 mg via ORAL
  Filled 2023-06-21: qty 1

## 2023-06-21 MED ORDER — ENOXAPARIN SODIUM 40 MG/0.4ML IJ SOSY
40.0000 mg | PREFILLED_SYRINGE | INTRAMUSCULAR | Status: DC
  Administered 2023-06-22 – 2023-06-23 (×2): 40 mg via SUBCUTANEOUS
  Filled 2023-06-21 (×5): qty 0.4

## 2023-06-21 NOTE — H&P (Addendum)
 History and Physical    April [name] ZOX:[id number] DOB: 06-06-1975 DOA: 06/20/2023  PCP: Annella Kief, NP   Patient coming from: Home   Chief Complaint:  Chief Complaint  Patient presents with   Abdominal Pain   Chest Pain    HPI:  April [name] is a 48 y.o. 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  Reports last had chemotherapy on 6/6.  Since this time had worsening of her abdominal pain which is described as diffuse "from my chest to my rectum".  Chest pain described as reflux, constant.  Not able to eat and drink, episode of vomiting, nonbloody, nonbilious.  Constipated over the past week.  Has tried oxycodone  at home with no relief of her pain.  Reports hot/cold chills, no measured fever.  No other recent illness.   Review of Systems:  ROS complete and negative except as marked above   Allergies  Allergen Reactions   Lisinopril Swelling and Other (See Comments)    Lip swelling and Angioedema   Irinotecan  Other (See Comments)    Stomach cramping Patient given 0.5 mL atropine  IV and was able to continue infusion.  See Progress note on 10/03/20.    Leucovorin  Calcium  Itching and Other (See Comments)    Patient reported itchy palms towards end of transfusion, see progress note 08/04/22.    Oxaliplatin  Itching and Other (See Comments)    Patient reported itchy palms towards end of transfusion, see progress note on 08/04/22.    Prior to Admission medications   Medication Sig Start Date End Date Taking? Authorizing Provider  cloNIDine  (CATAPRES ) 0.1 MG tablet Take 3 tablets (0.3 mg total) by mouth 3 (three) times  daily. 06/09/23  Yes Shahmehdi, Seyed A, MD  dexamethasone  (DECADRON ) 4 MG tablet Take 4 mg by mouth See admin instructions. Take 4 mg by mouth twice a day for 3 days beginning on day 2 of chemo.   Yes [provider]  dicyclomine  (BENTYL ) 20 MG tablet Take 1 tablet (20 mg total) by mouth 3 (three) times daily as needed for spasms (rectal/AB pain). 02/17/23  Yes Sumner Ends, MD  docusate sodium  (COLACE) 100 MG capsule Take 1 capsule (100 mg total) by mouth 2 (two) times daily. 06/19/23  Yes Roseline Conine, NP  lactulose  (CHRONULAC ) 10 GM/15ML solution Take 15-30 mLs (10-20 g total) by mouth daily as needed for mild constipation. Patient taking differently: Take 10 g by mouth in the morning and at bedtime. 06/19/23  Yes Roseline Conine, NP  metoprolol  tartrate (LOPRESSOR ) 100 MG tablet Take 1 tablet (100 mg total) by mouth 2 (two) times daily. 06/09/23 07/09/23 Yes Shahmehdi, Constantino Demark, MD  oxyCODONE  (OXY IR/ROXICODONE ) 5 MG immediate release tablet Take 1-2 tablets (5-10 mg total) by mouth every 6 (six) hours as needed for severe pain (pain score 7-10). 06/19/23  Yes Roseline Conine, NP  pantoprazole  (PROTONIX ) 40 MG tablet Take 1 tablet (40 mg total) by mouth daily before breakfast. Patient taking differently: Take 40 mg by mouth daily as needed (for reflux or heartburn- 30 minutes before food). 02/27/23  Yes Ozell Blunt, MD  polyethylene glycol powder (GLYCOLAX /MIRALAX ) 17 GM/SCOOP powder Take 17 g by mouth daily as needed. Patient taking differently: Take 17 g  by mouth daily as needed (if Lactulose  is ineffective). 06/09/23  Yes Shahmehdi, Constantino Demark, MD  potassium chloride  (MICRO-K ) 10 MEQ CR capsule Take 2 capsules (20 mEq total) by mouth 2 (two) times daily. 06/11/23  Yes Burton, Lacie K, NP  Sodium Chloride  Flush (NORMAL SALINE FLUSH) 0.9 % SOLN Use 5 mLs by Intracatheter route daily in biliary drain at 6 (six) AM as directed Patient taking differently: 5 mLs by Intracatheter route daily at 6 (six)  AM. Use as directed- PICC line; every 3 days 03/14/23  Yes [place], Eric J, DO  amLODipine  (NORVASC ) 10 MG tablet Take 1 tablet (10 mg total) by mouth daily. Patient not taking: Reported on 06/11/2023 11/24/22   Armenta Landau, MD    Past Medical History:  Diagnosis Date   Ascending aortic aneurysm [facility]) 12/16/2022   Benign essential hypertension 10/06/2017   Last Assessment & Plan:  Formatting of this note might be different from the original. Patient's blood pressure noted to be elevated at today's visit 188/98.  Patient will be provided with antihypertensives in the treatment center if needed in order to meet her Avastin  parameters.  Patient recommended to follow up with her primary care physician regarding medication management.  Patient is not cur   Cancer [facility])    Change in bowel habits 06/28/2022   Colon cancer (HCC)    History of colon cancer 06/28/2022   Hypertension    Prolonged QT interval 01/07/2018   Renal calculi    bilateral   Sepsis due to undetermined organism (HCC) 12/24/2022   Sialoadenitis of submandibular gland 12/16/2022    Past Surgical History:  Procedure Laterality Date   BALLOON DILATION N/A 02/26/2023   Procedure: BALLOON DILATION;  Surgeon: Normie Becton., MD;  Location: [facility]OS[facility];  Service: Gastroenterology;  Laterality: N/A;   BILIARY BRUSHING  03/24/2022   Procedure: BILIARY BRUSHING;  Surgeon: Brice Campi Albino Alu., MD;  Location: [facility] ENDOSCOPY;  Service: Gastroenterology;;   BILIARY DILATION  03/24/2022   Procedure: BILIARY DILATION;  Surgeon: Normie Becton., MD;  Location: [facility] ENDOSCOPY;  Service: Gastroenterology;;   BILIARY STENT PLACEMENT  03/24/2022   Procedure: BILIARY STENT PLACEMENT;  Surgeon: Normie Becton., MD;  Location: [facility] ENDOSCOPY;  Service: Gastroenterology;;   BILIARY STENT PLACEMENT N/A 02/26/2023   Procedure: BILIARY STENT PLACEMENT;  Surgeon: Normie Becton., MD;  Location: [facility];  Service:  Gastroenterology;  Laterality: N/A;   BIOPSY  03/24/2022   Procedure: BIOPSY;  Surgeon: Brice Campi Albino Alu., MD;  Location: [facility] ENDOSCOPY;  Service: Gastroenterology;;   BIOPSY  11/23/2022   Procedure: BIOPSY;  Surgeon: Tobin Forts, MD;  Location: [facility];  Service: Gastroenterology;;   BIOPSY  02/26/2023   Procedure: BIOPSY;  Surgeon: Normie Becton., MD;  Location: [facility];  Service: Gastroenterology;;   CESAREAN SECTION     x2   COLONOSCOPY N/A 11/23/2022   Procedure: COLONOSCOPY;  Surgeon: Tobin Forts, MD;  Location: [facility];  Service: Gastroenterology;  Laterality: N/A;   CYSTOSCOPY WITH RETROGRADE PYELOGRAM, URETEROSCOPY AND STENT PLACEMENT Right 10/22/2020   Procedure: CYSTOSCOPY WITH RETROGRADE PYELOGRAM, URETEROSCOPY AND STENT PLACEMENT;  Surgeon: Roxane Copp, MD;  Location: [facility] ORS;  Service: Urology;  Laterality: Right;   ENDOSCOPIC RETROGRADE CHOLANGIOPANCREATOGRAPHY (ERCP) WITH PROPOFOL  N/A 02/26/2023   Procedure: ENDOSCOPIC RETROGRADE CHOLANGIOPANCREATOGRAPHY (ERCP) WITH PROPOFOL ;  Surgeon: Brice Campi Albino Alu., MD;  Location: [facility] ENDOSCOPY;  Service: Gastroenterology;  Laterality: N/A;   ERCP N/A 03/24/2022   Procedure: ENDOSCOPIC RETROGRADE CHOLANGIOPANCREATOGRAPHY (  ERCP);  Surgeon: Mansouraty, Albino Alu., MD;  Location: [facility] ENDOSCOPY;  Service: Gastroenterology;  Laterality: N/A;   ESOPHAGOGASTRODUODENOSCOPY (EGD) WITH PROPOFOL  N/A 02/26/2023   Procedure: ESOPHAGOGASTRODUODENOSCOPY (EGD) WITH PROPOFOL ;  Surgeon: Brice Campi Albino Alu., MD;  Location: [facility] ENDOSCOPY;  Service: Gastroenterology;  Laterality: N/A;  patient needs EGD in addition to ERCP   IR PATIENT EVAL TECH 0-60 MINS  12/15/2022   IR PERC CHOLECYSTOSTOMY  03/11/2023   IR RADIOLOGIST EVAL & MGMT  09/16/2019   IR RADIOLOGIST EVAL & MGMT  09/23/2019   IR RADIOLOGIST EVAL & MGMT  09/20/2019   IR RADIOLOGIST EVAL & MGMT  09/30/2019   IR RADIOLOGIST EVAL & MGMT  04/20/2023   IR REMOVAL TUN  ACCESS W/ PORT W/O FL MOD SED  09/14/2019   IR VENO/EXT/UNI RIGHT  04/11/2022   PANCREATIC STENT PLACEMENT  03/24/2022   Procedure: PANCREATIC STENT PLACEMENT;  Surgeon: Normie Becton., MD;  Location: [facility] ENDOSCOPY;  Service: Gastroenterology;;   REMOVAL OF STONES  03/24/2022   Procedure: REMOVAL OF STONES;  Surgeon: Normie Becton., MD;  Location: [facility] ENDOSCOPY;  Service: Gastroenterology;;   REMOVAL OF STONES  02/26/2023   Procedure: REMOVAL OF Sludge;  Surgeon: Normie Becton., MD;  Location: [facility];  Service: Gastroenterology;;   [name]  03/24/2022   Procedure: [name];  Surgeon: Normie Becton., MD;  Location: [facility] ENDOSCOPY;  Service: Gastroenterology;;   [name] REMOVAL  02/26/2023   Procedure: STENT REMOVAL;  Surgeon: Normie Becton., MD;  Location: [facility] ENDOSCOPY;  Service: Gastroenterology;;   URETERAL STENT PLACEMENT       reports that she has quit smoking. Her smoking use included cigarettes. She has never used smokeless tobacco. She reports that she does not currently use alcohol. She reports that she does not currently use drugs after having used the following drugs: Marijuana.  Family History  Problem Relation Age of Onset   Hypertension Mother    Diabetes Mellitus II Father    Stroke Father    Ulcers Sister    Colon cancer Neg Hx    Esophageal cancer Neg Hx    Inflammatory bowel disease Neg Hx    Liver disease Neg Hx    Pancreatic cancer Neg Hx    Rectal cancer Neg Hx    Stomach cancer Neg Hx      Physical Exam: Vitals:   06/21/23 0200 06/21/23 0405 06/21/23 0500 06/21/23 0522  BP: (!) 151/111 (!) 196/138 (!) 187/127 (!) 190/124  Pulse: 84 86 83 80  Resp: 16 18 19 16   Temp:  98.9 F (37.2 C)  (!) 97.4 F (36.3 C)  TempSrc:    Oral  SpO2: 96% 98% 97% 98%    Gen: Awake, alert, chronically ill-appearing CV: Regular, normal S1, S2, 1/6 SEM Resp: Normal WOB, CTAB  Abd: Round, slightly distended, hypoactive,  moderate diffuse tenderness, no rebound, guarding, rigidity.   MSK: Symmetric, no edema  Skin: No rashes or lesions to exposed skin  Neuro: Alert and interactive  Psych: euthymic, appropriate    Data review:   Labs reviewed, notable for:   Lactate 0.9 -> 0.6 K2.9, bicarb 30, anion gap 14 Mag 1.8 LFT alk phos 715, other LFT normal WBC 12  Micro:  Results for orders placed or performed during the hospital encounter of 03/24/23  Culture, blood (routine x 2)     Status: None   Collection Time: 03/24/23  3:56 PM   Specimen: BLOOD  Result Value Ref Range Status  Specimen Description   Final    BLOOD BLOOD RIGHT FOREARM Performed at [facility] [place], [address], [place], [place] [address]    Special Requests   Final    BOTTLES DRAWN AEROBIC AND ANAEROBIC Blood Culture results may not be optimal due to an inadequate volume of blood received in culture bottles Performed at [facility], [address] [place], [place] [address]    Culture   Final    NO GROWTH 5 DAYS Performed at [facility], [address], [place], [place] [address]    Report Status 03/29/2023 FINAL  Final  Culture, blood (routine x 2)     Status: None   Collection Time: 03/24/23  5:00 PM   Specimen: BLOOD LEFT FOREARM  Result Value Ref Range Status   Specimen Description   Final    BLOOD LEFT FOREARM Performed at [facility], [address], [place], [place] [address]    Special Requests   Final    BOTTLES DRAWN AEROBIC AND ANAEROBIC Blood Culture results may not be optimal due to an inadequate volume of blood received in culture bottles Performed at [facility], [address] [place], [place] [address]    Culture   Final    NO GROWTH 5 DAYS Performed at [facility], [address], [place], [place] [address]    Report Status 03/29/2023 FINAL  Final   *Note: Due to a large number of results and/or encounters for the  requested time period, some results have not been displayed. A complete set of results can be found in Results Review.    Imaging reviewed:  CT ABDOMEN PELVIS W CONTRAST Result Date: 06/21/2023 CLINICAL DATA:  Abdominal EXAM: CT ABDOMEN AND PELVIS WITH CONTRAST TECHNIQUE: Multidetector CT imaging of the abdomen and pelvis was performed using the standard protocol following bolus administration of intravenous contrast. RADIATION DOSE REDUCTION: This exam was performed according to the departmental dose-optimization program which includes automated exposure control, adjustment of the mA and/or kV according to patient size and/or use of iterative reconstruction technique. CONTRAST:  75mL OMNIPAQUE  IOHEXOL  350 MG/ML SOLN COMPARISON:  06/07/2023 FINDINGS: Lower chest: See chest CT report Hepatobiliary: Biliary stents remain in place, unchanged. Mild periportal edema is similar to prior study. Heterogeneous enhancement. Peripheral hypodensities along the surface of the liver, presumably capsular/peritoneal disease. Small amount of perihepatic ascites. Findings stable since prior study. Pancreas: Slight ductal dilatation, stable since prior study. No perihepatic edema or focal abnormality. Spleen: No focal abnormality.  Normal size. Adrenals/Urinary Tract: Bilateral nephrolithiasis, the largest 11 mm in the left midpole. No ureteral stones or hydronephrosis. Adrenal glands and urinary bladder unremarkable. Stomach/Bowel: Again noted is rectal wall thickening, unchanged since prior study. Moderate to large stool burden throughout the colon. Stomach and small bowel decompressed. No bowel obstruction. Vascular/Lymphatic: Aortoiliac atherosclerosis. No evidence of aneurysm or adenopathy. Reproductive: Left pelvic cyst measures 5.5 cm compared to 5 cm previously, likely left ovarian in nature. Uterus and right adnexa unremarkable. Other: Bulky peritoneal disease noted in the right abdomen and along the liver surface, stable  since prior study. 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 No focal or suspicious osseous abnormality. IMPRESSION: Stable heterogeneous enhancement throughout the liver with nodularity along the surface of the liver, likely capsular/peritoneal disease. Small amount of perihepatic ascites. Stable nodularity in the right abdomen, likely peritoneal/omental metastatic disease, stable. Stable biliary ductal stents. Bilateral nephrolithiasis.  No hydronephrosis. Moderate  to large stool burden. Circumferential rectal wall thickening/mass, unchanged. Electronically Signed   By: Janeece Mechanic M.D.   On: 06/21/2023 01:44   CT Angio Chest PE W/Cm &/Or Wo Cm Result Date: 06/21/2023 CLINICAL DATA:  Chest pain EXAM: CT ANGIOGRAPHY CHEST WITH CONTRAST TECHNIQUE: Multidetector CT imaging of the chest was performed using the standard protocol during bolus administration of intravenous contrast. Multiplanar CT image reconstructions and MIPs were obtained to evaluate the vascular anatomy. RADIATION DOSE REDUCTION: This exam was performed according to the departmental dose-optimization program which includes automated exposure control, adjustment of the mA and/or kV according to patient size and/or use of iterative reconstruction technique. CONTRAST:  75mL OMNIPAQUE  IOHEXOL  350 MG/ML SOLN COMPARISON:  05/28/2023 FINDINGS: Cardiovascular: Left PICC line remains in place with the tip at the cavoatrial junction. No filling defects in the pulmonary arteries to suggest pulmonary emboli. Mild cardiomegaly. Aorta normal caliber. Mediastinum/Nodes: No mediastinal, hilar, or axillary adenopathy. Trachea and esophagus are unremarkable. Thyroid unremarkable. 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 Left lower lobe pulmonary nodule on image 87 measures 3 mm, stable since prior study. Nodule along the left major fissure in the left lower lobe on image 77 is unchanged.  Bibasilar atelectasis. Dependent atelectasis in the left lower lobe. No effusions. Upper Abdomen: No acute findings Musculoskeletal: Chest wall soft tissues are unremarkable. No acute bony abnormality. Review of the MIP images confirms the above findings. IMPRESSION: No evidence of pulmonary embolus. Mild cardiomegaly. Small left lower lobe pulmonary nodules measuring up to 3 mm are unchanged since prior study. Dependent and bibasilar atelectasis. Electronically Signed   By: Janeece Mechanic M.D.   On: 06/21/2023 01:37   DG Chest Port 1 View Result Date: 06/20/2023 CLINICAL DATA:  Chest pain EXAM: PORTABLE CHEST 1 VIEW COMPARISON:  05/22/2023 FINDINGS: The heart size and mediastinal contours are within normal limits. Both lungs are clear. The visualized skeletal structures are unremarkable. IMPRESSION: No active disease. Electronically Signed   By: Juanetta Nordmann M.D.   On: 06/20/2023 20:49    EKG:  Personally reviewed, sinus tachycardia 122, RAE, QTc 501, ST depression inferiorly.  ED Course:  For suspected sepsis treated with vancomycin , cefepime , Flagyl , 2 L IV fluid and started on 150 cc an hour. otherwise treated with K 40 mEq p.o. and 2 g IV.,  Dilaudid , morphine , Phenergan    Assessment/Plan:  48 y.o. 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   SIRS suspect secondary to hypovolemia v. Sepsis from intraabd source  Abdominal pain, Nausea/vomiting, constipation, intolerance of p.o.'s P/w 2 days of abd pain, intolerance POs, N/V, constipation x 1 week. Recent chemo 6/6. On initial evaluation tachycardic into the 130s, tachypneic in the mid 20s. Tmax 37.7.  WBC 12.   Lactate 0.9 -> 0.6.  CT abdomen pelvis demonstrating stable appearance of malignancy with rectal mass, liver mets, peritoneal disease, stable biliary stents; small perihepatic ascites noted and moderate to large stool burden.  Suspect initial presentation more so related to hypovolemia however she is at risk of infection given her immunocompromise, especially with biliary disease.  - S/p vancomycin , cefepime .  Narrow to ceftriaxone  2 g IV every 24 hours, continue Flagyl  500 mg IV every 12 hours. - S/p 2 L IV fluid, continue LR at  100 cc an hour - Follow-up blood cultures - Clear liquid diet, advance as tolerates. - Bowel regimen  Uncontrolled malignancy related pain  -Increase oxycodone  to 10/15 mg every 4 as needed for moderate-severe.  Dilaudid  0.5 mg IV every 4 as needed for breakthrough  Chest pain, likely reflux related  Atypical prolonged and reflux quality of chest pain.  EKG with ST depressions inferiorly.  CTA chest negative for PE. - Check high-sensitivity troponin - Treat empirically for reflux with GI cocktail and sucralfate, continue home PPI  Hypokalemia, hypomagnesemia - Serial repletion  Prolonged QTc - Electrolyte repletion as per above, cautious with med Rx.  Chronic medical problems: Metastatic rectal adenocarcinoma: mets to peritoneum, liver, C/B biliary obstruction s/p biliary stenting, currently on chemotherapy with irinotecan /panitumumab  (last on 6/6).  Resume dexamethasone  post recent chemo.  Follows with Dr. Scherrie Curt  Hypertension Hypertension, uncontrolled: Suspect related to pain.  Resume home amlodipine  10 mg, clonidine  0.3 mg 3 times daily, metoprolol  100 mg twice daily. COPD: DuoNebs as needed Aortic aneurysm: Noted, outpatient surveillance  There is no height or weight on file to calculate BMI.    DVT prophylaxis:  Lovenox  Code Status:  Full Code Diet:  Diet Orders (From admission, onward)     Start     Ordered   06/21/23 0413  Diet clear liquid Room  service appropriate? Yes; Fluid consistency: Thin  Diet effective now       Question Answer Comment  Room service appropriate? Yes   Fluid consistency: Thin      06/21/23 0417           Family Communication:  None   Consults:  None   Admission status:   Inpatient, Telemetry bed  Severity of Illness: The appropriate patient status for this patient is INPATIENT. Inpatient status is judged to be reasonable and necessary in order to provide the required intensity of service to ensure the patient's safety. The patient's presenting symptoms, physical exam findings, and initial radiographic and laboratory data in the context of their chronic comorbidities is felt to place them at high risk for further clinical deterioration. Furthermore, it is not anticipated that the patient will be medically stable for discharge from the hospital within 2 midnights of admission.   * I certify that at the point of admission it is my clinical judgment that the patient will require inpatient hospital care spanning beyond 2 midnights from the point of admission due to high intensity of service, high risk for further deterioration and high frequency of surveillance required.*   Arnulfo Larch, MD [facility]  How to contact the [facility] Attending or Consulting provider 7A - 7P or covering provider during after hours 7P -7A, for this patient.  Check the care team in [facility] and look for a) attending/consulting [facility] provider listed and b) the [facility] team listed Log into [email or web address] and use [place]'s universal password to access. If you do not have the password, please contact the hospital operator. Locate the [facility] provider you are looking for under [facility] and page to a number that you can be directly reached. If you still have difficulty reaching the provider, please page the [facility] (Director on Call) for the Hospitalists listed on amion for assistance.  06/21/2023, 5:30 AM

## 2023-06-21 NOTE — Consult Note (Signed)
 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  Patient had last received chemotherapy on 06/19/2023.  Palliative medicine team consulted to assist with complex medical decision making and symptom management. Of note, patient has previously been seen by inpatient palliative medicine team.  Extensive review of EMR prior to presenting to bedside.  Reviewed recent hospitalist documentation.  Reviewed recent outpatient oncology documentation from 06/19/2023.  Review of recent CMP noted BUN 7, creatinine 0.37 so GFR estimated >60.  Patient's bilirubin within normal ranges 0.7.  Reviewed lab work to assist with medication management. At time of EMR review in past 24 hours patient has received as needed IV Dilaudid  1 mg x 3 doses, as needed IV Dilaudid  0.5 mg x 1 dose, as needed IV morphine  4 mg x 1 dose, and as needed p.o. oxycodone  10 mg x 1 dose.  Presented to bedside to meet with patient.  Patient laying in bed and appears uncomfortable.  Able to introduce myself as a member of the palliative medicine team my role in patient's medical journey.  Spent time learning about patient's symptom burden at this time as this was patient's primary concern.  Patient noting that she has having severe pain in her abdomen and into her chest.   Patient describing some aspects of GERD while also describing severe aching pain.  Patient was taking as needed oxycodone  at home without relief.  Patient does feel the IV Dilaudid  assist with management though does not control patient's pain.  Patient denies adverse effects from receiving 1 mg IV Dilaudid .  Discussed the patient was previously on MS Contin  for long-acting medication.  Patient believes the dosing was at 30 mg every 12 hours during the day.  Patient stopped taking this because she did not feel like it was helping.  Discussed patient likely needed adjustment of doses as patient would need to be on long-acting and short acting medication.  Patient acknowledged this.  Discussed with patient's uncontrolled pain at this time, will start PCA to assist with dose finding so patient can be transitioned over to oral long-acting and short acting medications when appropriate.  Discussed use of PCA with patient and she is agreeing with initiating PCA at this time.  Noted would start low-dose on PCA and dosing can be adjusted based on patient's symptom response.  Also reviewed patient's nausea and vomiting symptoms.  Patient feels they are improved at this time.  Discussed based on patient's EKG, would recommend use of as needed Ativan  at low-dose for nausea and vomiting management.  Explained risk and benefits of this medication to patient and encouraged that it is only for as needed nausea or vomiting.  Patient voiced agreement with this plan.  Patient also shows constipation on imaging.  Patient needs to have bowel movement to assist with abdominal pain management.  All questions answered at that time.  Notified medicine team  to continue following patient's medical journey.  Discussed care with hospitalist, pharmacist, and RN to coordinate care.  Primary Diagnoses  Present on Admission:  Hypovolemia  Sepsis (HCC)  Cancer related pain  Hypokalemia   Palliative Review of Systems: Abdominal  pain  Past Medical History:  Diagnosis Date   Ascending aortic aneurysm (HCC) 12/16/2022   Benign essential hypertension 10/06/2017   Last Assessment & Plan:  Formatting of this note might be different from the original. Patient's blood pressure noted to be elevated at today's visit 188/98.  Patient will be provided with antihypertensives in the treatment center if needed in order to meet her Avastin  parameters.  Patient recommended to follow up with her primary care physician regarding medication management.  Patient is not cur   [facility])    Change in bowel habits 06/28/2022   Colon cancer (HCC)    History of colon cancer 06/28/2022   Hypertension    Prolonged QT interval 01/07/2018   Renal calculi    bilateral   Sepsis due to undetermined organism (HCC) 12/24/2022   Sialoadenitis of submandibular gland 12/16/2022   Social History   Socioeconomic History   Marital status: Single    Spouse name: Not on file   Number of children: Not on file   Years of education: Not on file   Highest education level: Not on file  Occupational History   Not on file  Tobacco Use   Smoking status: Former    Types: Cigarettes   Smokeless tobacco: Never   Tobacco comments:    Quit 1 month ago   Vaping Use   Vaping status: Never Used  Substance and Sexual Activity   Alcohol use: Not Currently   Drug use: Not Currently    Types: Marijuana   Sexual activity: Not Currently  Other Topics Concern   Not on file  Social History Narrative   Not on file   Social Drivers of Health   Financial Resource Strain: Low Risk  (11/11/2022)   Overall Financial Resource Strain (CARDIA)    Difficulty of Paying Living Expenses: Not hard at all  Food Insecurity: No Food Insecurity (06/21/2023)   Hunger Vital Sign    Worried About Running Out of Food in the Last Year: Never true    Ran Out of Food in the Last Year: Never true  Transportation Needs: No Transportation Needs (06/21/2023)   PRAPARE -  Administrator, Civil Service (Medical): No    Lack of Transportation (Non-Medical): No  Physical Activity: Inactive (11/11/2022)   Exercise Vital Sign    Days of Exercise per Week: 0 days    Minutes of Exercise per Session: 0 min  Stress: No Stress Concern Present (11/11/2022)   [employer] of Occupational Health - Occupational Stress Questionnaire    Feeling of Stress : Not at all  Social Connections: Socially Isolated (01/24/2023)   Social Connection and Isolation Panel [NHANES]    Frequency of Communication with Friends and Family: More than three times a week    Frequency of Social Gatherings with Friends and Family: More than three times a week    Attends Religious Services: Never    Database administrator or Organizations: No    Attends Banker Meetings: Never    Marital Status: Never married   Family History  Problem Relation Age of Onset   Hypertension Mother    Diabetes Mellitus II Father    Stroke Father    Ulcers Sister  Colon cancer Neg Hx    Esophageal cancer Neg Hx    Inflammatory bowel disease Neg Hx    Liver disease Neg Hx    Pancreatic cancer Neg Hx    Rectal cancer Neg Hx    Stomach cancer Neg Hx    Scheduled Meds:  amLODipine   10 mg Oral Daily   Chlorhexidine  Gluconate Cloth  6 each Topical Daily   cloNIDine   0.3 mg Oral TID   dexamethasone   4 mg Oral Daily   enoxaparin  (LOVENOX ) injection  40 mg Subcutaneous Q24H   feeding supplement  1 Container Oral TID BM   metoprolol  tartrate  100 mg Oral BID   pantoprazole   40 mg Oral BID   senna  2 tablet Oral QHS   sodium chloride  flush  3 mL Intravenous Q12H   sucralfate  1 g Oral TID WC & HS   Continuous Infusions:  cefTRIAXone  (ROCEPHIN )  IV 2 g (06/21/23 0727)   lactated ringers  Stopped (06/20/23 2331)   lactated ringers  100 mL/hr at 06/21/23 0601   metronidazole  500 mg (06/21/23 1001)   promethazine  (PHENERGAN ) injection (IM or IVPB) Stopped (06/20/23 2228)   PRN  Meds:.acetaminophen , alum & mag hydroxide-simeth, bisacodyl , dicyclomine , HYDROmorphone  (DILAUDID ) injection, HYDROmorphone , ipratropium-albuterol , lactulose , melatonin, promethazine  (PHENERGAN ) injection (IM or IVPB), sodium chloride  flush Allergies  Allergen Reactions   Lisinopril Swelling and Other (See Comments)    Lip swelling and Angioedema   Irinotecan  Other (See Comments)    Stomach cramping Patient given 0.5 mL atropine  IV and was able to continue infusion.  See Progress note on 10/03/20.    Leucovorin  Calcium  Itching and Other (See Comments)    Patient reported itchy palms towards end of transfusion, see progress note 08/04/22.    Oxaliplatin  Itching and Other (See Comments)    Patient reported itchy palms towards end of transfusion, see progress note on 08/04/22.   CBC:    Component Value Date/Time   WBC 9.9 06/21/2023 0539   HGB 10.3 (L) 06/21/2023 0539   HGB 10.8 (L) 06/19/2023 0857   HCT 31.6 (L) 06/21/2023 0539   PLT 311 06/21/2023 0539   PLT 387 06/19/2023 0857   MCV 88.3 06/21/2023 0539   NEUTROABS 11.1 (H) 06/20/2023 2053   LYMPHSABS 1.2 06/20/2023 2053   MONOABS 0.3 06/20/2023 2053   EOSABS 0.0 06/20/2023 2053   BASOSABS 0.0 06/20/2023 2053   Comprehensive Metabolic Panel:    Component Value Date/Time   NA 133 (L) 06/21/2023 0539   K 3.4 (L) 06/21/2023 0539   CL 95 (L) 06/21/2023 0539   CO2 29 06/21/2023 0539   BUN 7 06/21/2023 0539   CREATININE 0.37 (L) 06/21/2023 0539   CREATININE 0.41 (L) 06/19/2023 0857   GLUCOSE 83 06/21/2023 0539   CALCIUM  8.5 (L) 06/21/2023 0539   AST 25 06/21/2023 0539   AST 40 06/19/2023 0857   ALT 16 06/21/2023 0539   ALT 18 06/19/2023 0857   ALKPHOS 606 (H) 06/21/2023 0539   BILITOT 0.7 06/21/2023 0539   BILITOT 0.3 06/19/2023 0857   PROT 6.9 06/21/2023 0539   ALBUMIN 2.6 (L) 06/21/2023 0539    Physical Exam: Vital Signs: BP 108/74   Pulse 80   Temp 97.8 F (36.6 C) (Oral)   Resp 17   Wt 58 kg   LMP 01/07/2006    SpO2 99%   BMI 21.95 kg/m  SpO2: SpO2: 99 % O2 Device:   O2 Flow Rate:   Intake/output summary:  Intake/Output Summary (Last 24  hours) at 06/21/2023 1248 Last data filed at 06/21/2023 1000 Gross per 24 hour  Intake 1946.64 ml  Output --  Net 1946.64 ml   LBM: Last BM Date : 06/14/23 Baseline Weight: Weight: 58 kg Most recent weight: Weight: 58 kg  General: appears uncomfortable, laying in bed, chronically ill appearing Cardiovascular: RRR Respiratory: no increased work of breathing noted, not in respiratory distress Abdomen: distended Neuro: A&Ox4, following commands easily Psych: appropriately answers all questions          Palliative Performance Scale: 40%               Additional Data Reviewed: Recent Labs    06/20/23 2053 06/21/23 0539  WBC 12.7* 9.9  HGB 11.5* 10.3*  PLT 396 311  NA 136 133*  BUN 8 7  CREATININE 0.43* 0.37*    Imaging: CT ABDOMEN PELVIS W CONTRAST CLINICAL DATA:  Abdominal  EXAM: CT ABDOMEN AND PELVIS WITH CONTRAST  TECHNIQUE: Multidetector CT imaging of the abdomen and pelvis was performed using the standard protocol following bolus administration of intravenous contrast.  RADIATION DOSE REDUCTION: This exam was performed according to the departmental dose-optimization program which includes automated exposure control, adjustment of the mA and/or kV according to patient size and/or use of iterative reconstruction technique.  CONTRAST:  75mL OMNIPAQUE  IOHEXOL  350 MG/ML SOLN  COMPARISON:  06/07/2023  FINDINGS: Lower chest: See chest CT report  Hepatobiliary: Biliary stents remain in place, unchanged. Mild periportal edema is similar to prior study. Heterogeneous enhancement. Peripheral hypodensities along the surface of the liver, presumably capsular/peritoneal disease. Small amount of perihepatic ascites. Findings stable since prior study.  Pancreas: Slight ductal dilatation, stable since prior study. No perihepatic edema or  focal abnormality.  Spleen: No focal abnormality.  Normal size.  Adrenals/Urinary Tract: Bilateral nephrolithiasis, the largest 11 mm in the left midpole. No ureteral stones or hydronephrosis. Adrenal glands and urinary bladder unremarkable.  Stomach/Bowel: Again noted is rectal wall thickening, unchanged since prior study. Moderate to large stool burden throughout the colon. Stomach and small bowel decompressed. No bowel obstruction.  Vascular/Lymphatic: Aortoiliac atherosclerosis. No evidence of aneurysm or adenopathy.  Reproductive: Left pelvic cyst measures 5.5 cm compared to 5 cm previously, likely left ovarian in nature. Uterus and right adnexa unremarkable.  Other: Bulky peritoneal disease noted in the right abdomen and along the liver surface, stable since prior study.  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 No focal or suspicious osseous abnormality.  IMPRESSION: Stable heterogeneous enhancement throughout the liver with nodularity along the surface of the liver, likely capsular/peritoneal disease. Small amount of perihepatic ascites.  Stable nodularity in the right abdomen, likely peritoneal/omental metastatic disease, stable.  Stable biliary ductal stents.  Bilateral nephrolithiasis.  No hydronephrosis.  Moderate to large stool burden.  Circumferential rectal wall thickening/mass, unchanged.  Electronically Signed   By: Janeece Mechanic M.D.   On: 06/21/2023 01:44 CT Angio Chest PE W/Cm &/Or Wo Cm CLINICAL DATA:  Chest pain  EXAM: CT ANGIOGRAPHY CHEST WITH CONTRAST  TECHNIQUE: Multidetector CT imaging of the chest was performed using the standard protocol during bolus administration of intravenous contrast. Multiplanar CT image reconstructions and MIPs were obtained to evaluate the vascular anatomy.  RADIATION DOSE REDUCTION: This exam was performed according to the departmental dose-optimization program which  includes automated exposure control, adjustment of the mA and/or kV according to patient size and/or use of iterative reconstruction technique.  CONTRAST:  75mL OMNIPAQUE  IOHEXOL  350 MG/ML SOLN  COMPARISON:  05/28/2023  FINDINGS: Cardiovascular: Left PICC line remains in place with the tip at the cavoatrial junction. No filling defects in the pulmonary arteries to suggest pulmonary emboli. Mild cardiomegaly. Aorta normal caliber.  Mediastinum/Nodes: No mediastinal, hilar, or axillary adenopathy. Trachea and esophagus are unremarkable. Thyroid unremarkable.  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 Left lower lobe pulmonary nodule on image 87 measures 3 mm, stable since prior study. Nodule along the left major fissure in the left lower lobe on image 77 is unchanged. Bibasilar atelectasis. Dependent atelectasis in the left lower lobe. No effusions.  Upper Abdomen: No acute findings  Musculoskeletal: Chest wall soft tissues are unremarkable. No acute bony abnormality.  Review of the MIP images confirms the above findings.  IMPRESSION: No evidence of pulmonary embolus.  Mild cardiomegaly.  Small left lower lobe pulmonary nodules measuring up to 3 mm are unchanged since prior study.  Dependent and bibasilar atelectasis.  Electronically Signed   By: Janeece Mechanic M.D.   On: 06/21/2023 01:37    I personally reviewed recent imaging.   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  Patient had last received chemotherapy on 06/19/2023.  Palliative medicine team consulted to assist with complex medical decision making and symptom management. Of note, patient has previously been seen by inpatient palliative medicine team.  # Complex medical decision making/goals of care  - Unable to discuss complex medical decision making with patient at this time due to severe symptom burden.  Palliative medicine team will continue to follow and engage in conversations as able and appropriate.  -  Code Status: Full Code   # Symptom management Patient is receiving these palliative interventions for symptom management with an intent to improve quality of life.   - Pain, severe in setting of metastatic rectal adenocarcinoma   - Start IV Dilaudid  PCA 0.2 mg daily 10-minute as needed bolus.  No continuous infusion dose at this time.  Starting PCA to allow for appropriate dose finding.  Patient was previously on long-acting medication though stopped because she did not feel it was working.   -Discontinue oral Dilaudid  as needed   - Nausea/vomiting   - Personally reviewed EKG on 6 7 which noted QTc prolonged at 1501.  Prior EKG on 06/07/2023 noted QTc 469.  Discussed with hospitalist recommended to avoid QTc prolonging medications if able.   - Start as needed Ativan  0.5 mg every 6 hours as needed nausea/vomiting   - Rechceck EKG 6/9 or before if needed as per hospitalist    - Constipation Hospitalist has started medications for management.   - Receiving lactulose  20 mg twice daily.  Continue to adjust based on response   - Receiving senna 2 tabs daily, continue to adjust based on response  # Psycho-social/Spiritual Support:  - Support System: Mother  # Discharge Planning:  To Be Determined  Thank you for allowing the palliative care team to participate in the care [facility].  Barnett Libel, DO Palliative Care Provider PMT # [phone]  If patient remains  symptomatic despite maximum doses, please call PMT at [phone] between  0700 and 1900. Outside of these hours, please call attending, as PMT does not have night coverage.

## 2023-06-21 NOTE — Plan of Care (Signed)

## 2023-06-21 NOTE — Progress Notes (Signed)
 PHARMACY - PHYSICIAN COMMUNICATION CRITICAL VALUE ALERT - BLOOD CULTURE IDENTIFICATION (BCID)  April [name] is an 48 y.o. female who presented to [facility] on 06/20/2023 with a chief complaint of abdominal pain. PMH significant for metastatic rectal cancer with mets to liver, peritoneum currently on chemotherapy.   Assessment:  BCID + 2/4 (same set) GPC, identified as MRSE  Name of physician (or Provider) Contacted: Dr. Hilton Lucky  Current antibiotics: Ceftriaxone  + metronidazole   Changes to prescribed antibiotics recommended: Defer to MD > pathogen vs. Contaminant? Both bottles +from same set. Verbal order from MD to initiate vancomycin .  Results for orders placed or performed during the hospital encounter of 06/20/23  Blood Culture ID Panel (Reflexed) (Collected: 06/20/2023  9:16 PM)  Result Value Ref Range   Enterococcus faecalis NOT DETECTED NOT DETECTED   Enterococcus Faecium NOT DETECTED NOT DETECTED   Listeria monocytogenes NOT DETECTED NOT DETECTED   Staphylococcus species DETECTED (A) NOT DETECTED   Staphylococcus aureus (BCID) NOT DETECTED NOT DETECTED   Staphylococcus epidermidis DETECTED (A) NOT DETECTED   Staphylococcus lugdunensis NOT DETECTED NOT DETECTED   Streptococcus species NOT DETECTED NOT DETECTED   Streptococcus agalactiae NOT DETECTED NOT DETECTED   Streptococcus pneumoniae NOT DETECTED NOT DETECTED   Streptococcus pyogenes NOT DETECTED NOT DETECTED   A.calcoaceticus-baumannii NOT DETECTED NOT DETECTED   Bacteroides fragilis NOT DETECTED NOT DETECTED   Enterobacterales NOT DETECTED NOT DETECTED   Enterobacter cloacae complex NOT DETECTED NOT DETECTED   Escherichia coli NOT DETECTED NOT DETECTED   Klebsiella aerogenes NOT DETECTED NOT DETECTED   Klebsiella oxytoca NOT DETECTED NOT DETECTED   Klebsiella pneumoniae NOT DETECTED NOT DETECTED   Proteus species NOT DETECTED NOT DETECTED   Salmonella species NOT DETECTED NOT DETECTED   Serratia  marcescens NOT DETECTED NOT DETECTED   Haemophilus influenzae NOT DETECTED NOT DETECTED   Neisseria meningitidis NOT DETECTED NOT DETECTED   Pseudomonas aeruginosa NOT DETECTED NOT DETECTED   Stenotrophomonas maltophilia NOT DETECTED NOT DETECTED   Candida albicans NOT DETECTED NOT DETECTED   Candida auris NOT DETECTED NOT DETECTED   Candida glabrata NOT DETECTED NOT DETECTED   Candida krusei NOT DETECTED NOT DETECTED   Candida parapsilosis NOT DETECTED NOT DETECTED   Candida tropicalis NOT DETECTED NOT DETECTED   Cryptococcus neoformans/gattii NOT DETECTED NOT DETECTED   Methicillin resistance mecA/C DETECTED (A) NOT DETECTED    Shireen Dory, PharmD 06/21/2023  3:57 PM

## 2023-06-21 NOTE — Plan of Care (Signed)

## 2023-06-21 NOTE — Progress Notes (Signed)
 Pharmacy Antibiotic Note  April [name] is an 48 y.o. female who presented to [facility] on 06/20/2023 with a chief complaint of abdominal pain. PMH significant for metastatic rectal cancer with mets to liver, peritoneum currently on chemotherapy. BCID + 2/4 MRSE, pharmacy consulted to dose vancomycin .   Today, 06/21/23 WBC WNL SCr <1, round to 1 for dose calculations  Plan: Vancomycin  1000 mg IV q24h for estimated AUC of 450 Goal AUC 400-550. Check levels as needed. Monitor renal function, culture data.  Weight: 58 kg (127 lb 13.9 oz)  Temp (24hrs), Avg:98.6 F (37 C), Min:97.4 F (36.3 C), Max:99.8 F (37.7 C)  Recent Labs  Lab 06/19/23 0857 06/20/23 2031 06/20/23 2053 06/20/23 2300 06/21/23 0539  WBC 10.8*  --  12.7*  --  9.9  CREATININE 0.41*  --  0.43*  --  0.37*  LATICACIDVEN  --  0.9  --  0.6  --     Estimated Creatinine Clearance: 73.5 mL/min (A) (by C-G formula based on SCr of 0.37 mg/dL (L)).    Allergies  Allergen Reactions   Lisinopril Swelling and Other (See Comments)    Lip swelling and Angioedema   Irinotecan  Other (See Comments)    Stomach cramping Patient given 0.5 mL atropine  IV and was able to continue infusion.  See Progress note on 10/03/20.    Leucovorin  Calcium  Itching and Other (See Comments)    Patient reported itchy palms towards end of transfusion, see progress note 08/04/22.    Oxaliplatin  Itching and Other (See Comments)    Patient reported itchy palms towards end of transfusion, see progress note on 08/04/22.   Shireen Dory, PharmD 06/21/2023 4:08 PM

## 2023-06-21 NOTE — Progress Notes (Signed)
 Patient seen and examined.  Admitted early morning hours by nighttime hospitalist.  See H&P assessment plan for details.  On my exam, patient is tearful and continues to complain of epigastric and whole abdominal pain and rectal discomfort.  Remains hemodynamically stable.  In brief, 48 year old with metastatic rectal adenocarcinoma with metastasis to peritoneum, liver, biliary obstruction status post biliary stenting currently receiving chemotherapy, severe malignancy related pain, hypertension and COPD presented with uncontrolled abdominal pain, nausea vomiting and intolerance to oral intake.  Last chemotherapy received 6/6.  Worsening abdominal pain and rectal discomfort.  Last bowel movement about 6 days ago. In the emergency room, afebrile.  Blood pressure is stable.  Potassium 2.9.  WBC count 12.7.  Creatinine normal. CT angio of the chest without evidence of PE or consolidation. CT scan of the abdomen pelvis with contrast, evidence of metastatic disease, no evidence of acute infection or obstruction.  Admitted with IV fluids and antibiotics.   Intractable nausea vomiting and abdominal pain due to chemotherapy Severe intractable abdominal pain due to metastatic cancer Intra-abdominal infection suspected, less likely.  Blood cultures drawn.  Patient currently on Rocephin  and Flagyl .  Will discontinue antibiotics with no evidence of ongoing infection but will monitor very closely. Provide adequate pain medications.  IV and oral opiates.  Patient reported inadequate pain relief with oxycodone .  Will alternate between IV and oral Dilaudid  and possible oral Dilaudid  on discharge. On Protonix  40 mg daily, will increase to twice daily. Continue sucralfate. Continue dexamethasone  4 mg daily. Continue maintenance IV fluids. On clear liquid diet-advancing to full liquid diet so patient can have more choices. Replace electrolytes aggressively today.  Monitor levels. Oncology to determine further  course of action. Consult palliative care team to provide symptom management.   Total time spent: 40 minutes.  Same-day admit.  No charge visit.

## 2023-06-22 ENCOUNTER — Other Ambulatory Visit: Payer: Self-pay

## 2023-06-22 ENCOUNTER — Encounter: Payer: Self-pay | Admitting: *Deleted

## 2023-06-22 DIAGNOSIS — E861 Hypovolemia: Secondary | ICD-10-CM | POA: Diagnosis not present

## 2023-06-22 DIAGNOSIS — Z515 Encounter for palliative care: Secondary | ICD-10-CM | POA: Diagnosis not present

## 2023-06-22 DIAGNOSIS — Z79899 Other long term (current) drug therapy: Secondary | ICD-10-CM | POA: Diagnosis not present

## 2023-06-22 DIAGNOSIS — C787 Secondary malignant neoplasm of liver and intrahepatic bile duct: Secondary | ICD-10-CM | POA: Diagnosis not present

## 2023-06-22 DIAGNOSIS — Z7189 Other specified counseling: Secondary | ICD-10-CM

## 2023-06-22 DIAGNOSIS — C20 Malignant neoplasm of rectum: Secondary | ICD-10-CM | POA: Diagnosis not present

## 2023-06-22 DIAGNOSIS — R110 Nausea: Secondary | ICD-10-CM | POA: Diagnosis not present

## 2023-06-22 LAB — URINALYSIS, ROUTINE W REFLEX MICROSCOPIC
Bilirubin Urine: NEGATIVE
Glucose, UA: NEGATIVE mg/dL
Hgb urine dipstick: NEGATIVE
Ketones, ur: NEGATIVE mg/dL
Leukocytes,Ua: NEGATIVE
Nitrite: NEGATIVE
Protein, ur: NEGATIVE mg/dL
Specific Gravity, Urine: 1.015 (ref 1.005–1.030)
pH: 7 (ref 5.0–8.0)

## 2023-06-22 LAB — CULTURE, BLOOD (ROUTINE X 2): Special Requests: ADEQUATE

## 2023-06-22 LAB — CREATININE, SERUM
Creatinine, Ser: 0.57 mg/dL (ref 0.44–1.00)
GFR, Estimated: >60 mL/min (ref >60)

## 2023-06-22 MED ORDER — LACTATED RINGERS IV SOLN: INTRAVENOUS | Status: AC

## 2023-06-22 MED ORDER — OXYCODONE HCL ER 20 MG PO T12A
20.0000 mg | EXTENDED_RELEASE_TABLET | Freq: Two times a day (BID) | ORAL | Status: DC
  Administered 2023-06-22 – 2023-06-23 (×3): 20 mg via ORAL
  Filled 2023-06-22 (×2): qty 1

## 2023-06-22 NOTE — Progress Notes (Signed)
 IP PROGRESS NOTE  Subjective:   April [name] is well-known to me with a history of metastatic colon cancer.  She was last treated with irinotecan /panitumumab  on 06/19/2023.  She reports developing acute cramping abdominal discomfort and diaphoresis during the irinotecan  infusion.  She was able to complete the infusion, but her symptoms persisted at home.  She presented to the emergency room 06/20/2023 She has been constipated for the past week.  She continues to have abdominal pain, though this is significantly improved since being placed on a Dilaudid  PCA.  Prior to treatment on 06/19/2023 she reports pain was controlled with oral Dilaudid . Objective: Vital signs in last 24 hours: Blood pressure (!) 129/102, pulse 77, temperature 98 F (36.7 C), temperature source Oral, resp. rate 17, height 5\' 4"  (1.626 m), weight 126 lb (57.2 kg), last menstrual period 01/07/2006, SpO2 (!) 0%.  Intake/Output from previous day: 06/08 0701 - 06/09 0700 In: 1429.7 [P.O.:840; I.V.:89.7; IV Piggyback:500] Out: 900 [Urine:900]  Physical Exam:  HEENT: No thrush or ulcers Lungs: Clear bilaterally Cardiac: Regular rate and rhythm Abdomen: No mass, nondistended, mild tenderness in the right upper abdomen Extremities: No leg edema   Portacath/PICC-without erythema  Lab Results: Recent Labs    06/20/23 2053 06/21/23 0539  WBC 12.7* 9.9  HGB 11.5* 10.3*  HCT 35.2* 31.6*  PLT 396 311    BMET Recent Labs    06/20/23 2053 06/21/23 0539 06/22/23 0415  NA 136 133*  --   K 2.9* 3.4*  --   CL 92* 95*  --   CO2 30 29  --   GLUCOSE 87 83  --   BUN 8 7  --   CREATININE 0.43* 0.37* 0.57  CALCIUM  8.9 8.5*  --     Lab Results  Component Value Date   CEA1 <0.6 03/23/2022   CEA 52.60 (H) 06/19/2023   NWG956 213 (H) 03/23/2022    Studies/Results: CT ABDOMEN PELVIS W CONTRAST Result Date: 06/21/2023 CLINICAL DATA:  Abdominal EXAM: CT ABDOMEN AND PELVIS WITH CONTRAST TECHNIQUE: Multidetector CT imaging  of the abdomen and pelvis was performed using the standard protocol following bolus administration of intravenous contrast. RADIATION DOSE REDUCTION: This exam was performed according to the departmental dose-optimization program which includes automated exposure control, adjustment of the mA and/or kV according to patient size and/or use of iterative reconstruction technique. CONTRAST:  75mL OMNIPAQUE  IOHEXOL  350 MG/ML SOLN COMPARISON:  06/07/2023 FINDINGS: Lower chest: See chest CT report Hepatobiliary: Biliary stents remain in place, unchanged. Mild periportal edema is similar to prior study. Heterogeneous enhancement. Peripheral hypodensities along the surface of the liver, presumably capsular/peritoneal disease. Small amount of perihepatic ascites. Findings stable since prior study. Pancreas: Slight ductal dilatation, stable since prior study. No perihepatic edema or focal abnormality. Spleen: No focal abnormality.  Normal size. Adrenals/Urinary Tract: Bilateral nephrolithiasis, the largest 11 mm in the left midpole. No ureteral stones or hydronephrosis. Adrenal glands and urinary bladder unremarkable. Stomach/Bowel: Again noted is rectal wall thickening, unchanged since prior study. Moderate to large stool burden throughout the colon. Stomach and small bowel decompressed. No bowel obstruction. Vascular/Lymphatic: Aortoiliac atherosclerosis. No evidence of aneurysm or adenopathy. Reproductive: Left pelvic cyst measures 5.5 cm compared to 5 cm previously, likely left ovarian in nature. Uterus and right adnexa unremarkable. Other: Bulky peritoneal disease noted in the right abdomen and along the liver surface, stable since prior study. 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 No focal or suspicious osseous abnormality. IMPRESSION: Stable heterogeneous enhancement throughout  the liver with nodularity along the surface of the liver, likely capsular/peritoneal disease. Small  amount of perihepatic ascites. Stable nodularity in the right abdomen, likely peritoneal/omental metastatic disease, stable. Stable biliary ductal stents. Bilateral nephrolithiasis.  No hydronephrosis. Moderate to large stool burden. Circumferential rectal wall thickening/mass, unchanged. Electronically Signed   By: Janeece Mechanic M.D.   On: 06/21/2023 01:44   CT Angio Chest PE W/Cm &/Or Wo Cm Result Date: 06/21/2023 CLINICAL DATA:  Chest pain EXAM: CT ANGIOGRAPHY CHEST WITH CONTRAST TECHNIQUE: Multidetector CT imaging of the chest was performed using the standard protocol during bolus administration of intravenous contrast. Multiplanar CT image reconstructions and MIPs were obtained to evaluate the vascular anatomy. RADIATION DOSE REDUCTION: This exam was performed according to the departmental dose-optimization program which includes automated exposure control, adjustment of the mA and/or kV according to patient size and/or use of iterative reconstruction technique. CONTRAST:  75mL OMNIPAQUE  IOHEXOL  350 MG/ML SOLN COMPARISON:  05/28/2023 FINDINGS: Cardiovascular: Left PICC line remains in place with the tip at the cavoatrial junction. No filling defects in the pulmonary arteries to suggest pulmonary emboli. Mild cardiomegaly. Aorta normal caliber. Mediastinum/Nodes: No mediastinal, hilar, or axillary adenopathy. Trachea and esophagus are unremarkable. Thyroid unremarkable. 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 Left lower lobe pulmonary nodule on image 87 measures 3 mm, stable since prior study. Nodule along the left major fissure in the left lower lobe on image 77 is unchanged. Bibasilar atelectasis. Dependent atelectasis in the left lower lobe. No effusions. Upper Abdomen: No acute findings Musculoskeletal: Chest wall soft tissues are unremarkable. No acute bony abnormality. Review of the MIP images confirms the above findings. IMPRESSION: No evidence of pulmonary  embolus. Mild cardiomegaly. Small left lower lobe pulmonary nodules measuring up to 3 mm are unchanged since prior study. Dependent and bibasilar atelectasis. Electronically Signed   By: Janeece Mechanic M.D.   On: 06/21/2023 01:37   DG Chest Port 1 View Result Date: 06/20/2023 CLINICAL DATA:  Chest pain EXAM: PORTABLE CHEST 1 VIEW COMPARISON:  05/22/2023 FINDINGS: The heart size and mediastinal contours are within normal limits. Both lungs are clear. The visualized skeletal structures are unremarkable. IMPRESSION: No active disease. Electronically Signed   By: Juanetta Nordmann M.D.   On: 06/20/2023 20:49    Medications: I have reviewed the patient's current medications.  Assessment/Plan: Sigmoid colon cancer, stage IV (pT4a,pN2b,M1c) Colonoscopy 03/24/2019-3 rectal polyps-hyperplastic polyps, distal colon biopsy-at least intramucosal adenocarcinoma, completely obstructing mass in the distal sigmoid colon, could not be traversed, intact mismatch repair protein expression 03/24/2019-CEA 56.1 03/29/2019 CT abdomen/pelvis-circumferential thickening involving the entire mid and distal sigmoid colon to the level of the rectosigmoid junction, solid/cystic mass in the left ovary, bilateral nephrolithiasis Robotic assisted low anterior resection, mesenteric lymphadenectomy, bilateral salpingo-oophorectomy 04/08/2019 Pathology ([facility] review of outside pathology) metastatic adenocarcinoma involving the peritoneum overlying the round ligament, serosa of the urinary bladder, serosa of the right ureter a sacral area, pelvic peritoneum, and left ovary.  Omental biopsy with focal mucin pools with no carcinoma cells identified, right hemidiaphragm biopsy involved by metastatic adenocarcinoma, invasive adenocarcinoma the sigmoid colon, moderately differentiated, T4a, perineural and vascular invasion present, 7/22 lymph nodes, multiple tumor deposits, resection margins negative Negative for PD-L1, low probability of MSI-high, HER-2  negative, negative for BRAF, NRAS and KRAS alterations CTs 05/19/2019-no evidence of metastatic disease, findings suspicious for colitis of the transverse and ascending colon, small amount of ascites in the cul-de-sac, bilateral renal calculi Cycle 1 FOLFIRI 05/24/2019, bevacizumab   added with cycle 2 Cycle 4 FOLFIRI/bevacizumab  07/05/2019 Cycle 5 FOLFIRI 07/27/2019, bevacizumab  held secondary to hypertension Cycle 6 FOLFIRI 08/10/1999, bevacizumab  held secondary to hypertension CTs 08/30/2019-no evidence of recurrent disease, new subsolid right lower lobe nodule felt to be inflammatory, emphysema Cycle 7 FOLFIRI 09/26/2019, bevacizumab  remains on hold secondary to hypertension Cycle 8 FOLFIRI 10/17/2019, bevacizumab  held secondary to hypertension, Udenyca  added for neutropenia Cycle 9 FOLFIRI 11/07/2019, bevacizumab  held secondary to hypertension, Udenyca  Cycle 10 FOLFIRI 11/28/2019, bevacizumab  held, Udenyca  Cycle 11 FOLFIRI 12/26/2019, bevacizumab  held, Udenyca  CTs 01/25/2020-no evidence of recurrent disease, resolution of right lower lobe nodule Maintenance Xeloda  beginning 02/06/2020 CTs 04/25/2020- no evidence of metastatic disease, multiple bilateral renal calculi without hydronephrosis Maintenance Xeloda  continued CT abdomen/pelvis without contrast 08/10/2020-bilateral staghorn renal calculi, no evidence of metastatic disease; addendum 08/23/2020-small but increasing omental nodules. Cycle 1 FOLFIRI/panitumumab  09/19/2020 Cycle 2 FOLFIRI/Panitumumab  10/03/2020 Cycle 3 FOLFIRI/Panitumumab  10/17/2020 Cycle 4 FOLFIRI/panitumumab  10/31/2020 Cycle 5 FOLFIRI/Panitumumab  11/14/2020 CTs 11/26/2020-stable omental metastases compared to 10/22/2020, mildly decreased from 08/10/2020.  Left lower quadrant tiny paracolic gutter implant slightly decreased from CT 08/10/2020.  No new or progressive metastatic disease in the abdomen or pelvis. Cycle 6 FOLFIRI/Panitumumab  11/28/2020, irinotecan  dose reduced, treatment  schedule adjusted to every 3 weeks going forward Cycle 7 FOLFIRI/panitumumab  12/24/2020 Cycle 8 FOLFIRI/Panitumumab  01/16/2021--treatment held due to hypertension in the infusion area. Cycle 8 FOLFIRI/panitumumab  01/21/2021 Cycle 9 FOLFIRI/panitumumab  02/11/2021 Cycle 10 FOLFIRI/Panitumumab  03/04/2021 Cycle 11 FOLFIRI/Panitumumab  03/25/2021 CT abdomen/pelvis 04/10/2021-stable right omental and left posterior paracolic gutter nodules Cycle 12 FOLFIRI/panitumumab  04/15/2021 Cycle 13 FOLFIRI/panitumumab  05/06/2021 Cycle 14 FOLFIRI/Panitumumab  05/27/2021 Cycle 15 FOLFIRI/Panitumumab  06/17/2021 CT abdomen/pelvis 07/05/2021-slight enlargement of a dominant right omental implant, no new implants Patient requested a treatment break CT abdomen/pelvis 10/04/2021-mild increase in size of multiple omental soft tissue nodules, 4 mm calculus in the distal left ureter adjacent to the ureteral stent Cycle 1 Lonsurf  10/14/2021 10/28/2021 Avastin  every 2 weeks Cycle 2 Lonsurf  11/11/2021 Cycle 3 Lonsurf  12/09/2021 Cycle 4 Lonsurf  01/06/2022 Avastin  held 01/20/2022 due to proteinuria, 24-hour urine 43 mg CT/pelvis 01/30/2022-possible new peritoneal implant at the transverse colon, no significant change in other omental/peritoneal implants, stable chronic rectal wall thickening, status post removal of double-J left ureteral stent with no evidence of hydronephrosis or ureteral calculus, nonobstructing bilateral renal calculi Cycle 5 Lonsurf  02/05/2022 or 02/06/2022 Avastin  every 2 weeks Cycle 6 Lonsurf  03/03/2022 CTs 04/05/2022-increase in size and number of peritoneal implants compared to January 2024 central liver mass narrowing the anterior branch of the right portal vein, decreased biliary duct dilation, biliary stents in place, stable right cardiophrenic lymph nodes, separate pigtail stent in the lumen of the proximal duodenum Cycle 1 FOLFOX 04/14/2022 CT abdomen/pelvis 04/16/2022-stent within the third portion of the duodenum,  stable peritoneal nodules, indwelling biliary stents with mild left biliary duct dilation Cycle 2 FOLFOX 04/28/2022, Emend and prophylactic dexamethasone  added Cycle 3 FOLFOX 05/12/2022, Aloxi , Emend, prophylactic dexamethasone , Compazine  and lorazepam  as needed Chemotherapy held 05/25/2020 due to severe hypertension, evaluated in the emergency department Cycle 4 FOLFOX 06/03/2022 Cycle 5 FOLFOX 06/16/2022, oxaliplatin  dose reduced and 5-FU bolus eliminated due to neutropenia CT 06/25/2022-unchanged soft tissue at the hepatic hilum, unchanged, bile duct stent, decreased size of peritoneal and omental nodules and diminished volume of likely fluid in the lower left pelvis Cycle 6 FOLFOX 06/30/2022 Cycle 7 FOLFOX 07/21/2022 CT 07/24/2022 (in the emergency department with nausea, headache, abdominal pain)-no acute pathology.  No significant interval change in omental nodularity. Cycle 8 FOLFOX 08/04/2022, pruritus on the palms at the end  of the oxaliplatin  infusion, Pepcid  and Benadryl  given, symptoms resolved, infusion not resumed Cycle 9 FOLFOX 08/18/2022-Pepcid  and Benadryl  added to premedications, Oxaliplatin  diluted in a larger volume and infusion time increased Cycle 10 FOLFOX 09/08/2022 Cycle 11 FOLFOX 09/22/2022 10/06/2022 patient declined treatment CTs 10/15/2022-similar soft tissue fullness in the hepatic hilum with biliary stents in place, decrease in omental/peritoneal metastases Maintenance capecitabine  10/26/2022 CT abdomen/pelvis 11/19/2022-mild increase in tumor studding at the left paracolic gutter and omentum, circumferential wall thickening of the rectum, biliary stents in place with minimal increase in intrahepatic biliary dilation, nonobstructive renal calculi Colonoscopy 11/23/2022-anastomosis at 15 cm from the anal verge-patent, 2 cm polypoid tissue at the anastomosis biopsied, invasive adenocarcinoma moderately differentiated, preserved expression of major MMR proteins CTs 12/15/2022: Possible  developing nodule in the lateral left liver, increased omental nodularity, persistent wall thickening in the upper rectum CT abdomen/pelvis 12/24/2022: Increased thickened fold in the transverse colon-infectious/inflammatory, 2.1 cm lateral left liver lesion, potential new 5 mm segment 5 lesion, stable biliary stents with intrahepatic biliary dilation, central biliary wall thickening near the hepatic hilum, multiple peritoneal/omental implants unchanged from 12/15/2022, but likely larger than 11/19/2022 Cycle 1 irinotecan /Panitumumab  01/06/2023 Cycle 2 irinotecan /Panitumumab  01/20/2023 CTs 01/24/2023-slight increase in omental nodularity. Cycle 3 irinotecan /Panitumumab  02/03/2023 Cycle 4 irinotecan /panitumumab  02/17/2023 CTs abdomen/pelvis 02/24/2023-subtly hypoattenuating lesion within the peripheral aspect of hepatic segment 2 redemonstrated.  Additional scattered subcentimeter low-density lesions within the liver too small to characterize.  Prominent intrahepatic biliary dilatation with biliary stents in place.  Similar degree of nodularity along the omentum and left paracolic gutter.  No ascites. CTs 03/09/2023-indwelling endoscopically placed stents; level of intrahepatic biliary duct dilatation is slightly improved on the left and some on the right.  Gallbladder is dilated with wall edema which is increasing.  More nodular tissue extending up along the porta hepatis.  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  Rectum with nodular wall thickening with extension into the adjacent tissue, nodular areas seen extending perirectal and presacral spaces.  Peritoneal carcinomatosis again identified. Irinotecan /Panitumumab  04/02/2023 Irinotecan /panitumumab  04/16/2023 Irinotecan /Panitumumab  05/07/2023 Irinotecan /Panitumumab  06/19/2023   Hypertension G4 P3, twins Kidney stones-bilateral staghorn renal calculi on CT 08/10/2020 Infected Port-A-Cath 09/12/2019-placed on Augmentin , referred for Port-A-Cath removal;  Port-A-Cath removed 09/14/2019; PICC line placed 09/14/2019; culture staph aureus.  Course of Septra  completed. Neutropenia secondary to chemotherapy-Udenyca  added with cycle 8 FOLFIRI Right nephrostomy tube 09/12/2020; stent placed 10/09/2020, percutaneous nephrostolithotomy treatment of right-sided kidney stones 10/09/2020, malpositioned right ureter stent replacement 10/23/2020, right ureter stent removed 10/29/2020 Percutaneous left nephrostolithotomy 10/01/2021-no renal stone identified, left ureter stent left in place Cystoscopy 10/14/2021-stent removed 8.  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  2 moderate biliary strictures found in the hepatic duct system, malignant appearing.  Left and right hepatic duct and all intrahepatic branches were moderately dilated secondary to the strictures.  The strictures were dilated.  Plastic stent placed into the left hepatic duct and right hepatic duct. 9.  Admission 04/16/2022 with nausea/vomiting and abdominal pain, felt to be acute toxicity related to chemotherapy, discharged home  04/18/2022 10.  Admission 11/19/2022 with nausea/vomiting and abdominal pain-improved 11.  Admission 12/15/2022 with fever and right submandibular swelling/tenderness CT neck 12/15/2022: 2 mm distal right submandibular duct calculus with mild inflammatory change adjacent to the right submandibular gland 12.   Admission 12/24/2022 with a fever nausea/vomiting, diarrhea, Klebsiella bacteremia completed course of Augmentin  13.  Admission with abdominal pain and nausea/vomiting 03/09/2023 through 03/14/2023-concern for gallbladder inflammation.  Not felt to be a surgical candidate.  Percutaneous cholecystostomy tube placed 03/11/2023.  Tube removed 04/24/2023. 14.  Admission 06/20/2023 with abdominal pain and constipation  April [name] has metastatic colon cancer with abdominal carcinomatosis.  She was admitted 06/20/2023 with nausea and abdominal pain.  I suspect the acute symptoms were related to irinotecan  received on 06/19/2023.  She has chronic abdominal pain secondary to carcinomatosis with a dominant mass in the right abdomen.  Her pain has generally been under good control with oral Dilaudid  and she typically has bowel movements. The current constipation is likely secondary to carcinomatosis and narcotics. I discussed the difficult situation with Ms. Mangel.  She understands no therapy will be curative.  Her clinical status does not appear improved while on irinotecan /panitumumab , though there is no clear evidence of disease progression on imaging.  I reviewed the admission CT images.  Systemic treatment options are limited given her treatment history.  She would like to consider resection of the dominant right abdominal mass as she feels this may be contributing to her symptoms.  I explained section of the mass would not increase survival and may not be possible.  I recommend a referral to Dr. Nino Bass at [facility] who saw her initially.  She may be a candidate for a clinical trial and we can get a multidisciplinary opinion.  She has been admitted repeatedly with various symptoms, sometimes related to treatment.  She is a candidate for hospice care, but wants to continue treatment.  Recommendations: Continue pain regimen as recommended by Dr. Azalea Lento Bowel regimen Refer to Dr. Nino Bass at [facility] to consider salvage  systemic treatment options and clinical trial eligibility Outpatient follow-up as scheduled with Cancer center      LOS: 1 day   Coni Deep, MD   06/22/2023, 1:30 PM

## 2023-06-22 NOTE — Progress Notes (Signed)
   06/22/23 1653  Spiritual Encounters  Type of Visit Initial  Care provided to: Patient  Reason for visit Advance directives  OnCall Visit No   Visited with patient per consult to discuss AD and living will. Provided education and left forms for patient to discuss with family. Patient will notify nurse to contact chaplain when and if the forms are completed.

## 2023-06-22 NOTE — Progress Notes (Signed)
 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  Last chemotherapy received 6/6.  Worsening abdominal pain and rectal discomfort.  Last bowel movement about 6 days ago. In the emergency room, afebrile.  Blood pressure is stable.  Potassium 2.9.  WBC count 12.7.  Creatinine normal. CT angio of the chest without evidence of PE or consolidation. CT scan of the abdomen pelvis with contrast, evidence of metastatic disease, no evidence of acute infection or obstruction.  Admitted with IV fluids and antibiotics.  Subjective: Patient seen and examined.  She was able to eat regular food.  Pain controlled with PCA, but she still needs more pain medications.  She had some bowel movements with lactulose  last night.  She thinks to continue lactulose  so that she can avoid anal pain with bowel movements.  Remains afebrile.  Assessment & Plan:  Intractable nausea vomiting and abdominal pain due to chemotherapy Severe intractable abdominal pain due to metastatic cancer Intra-abdominal infection suspected, unlikely.  Blood cultures were drawn that shows Staph epidermidis which is likely contaminant.  Case discussed with infectious disease.  Discontinuing all antibiotics. Continue supportive care.  Advance diet as tolerated. Severe cancer-related pain, appreciate palliative care input.  Patient currently on Dilaudid  PCA.  Continue Protonix  with increased dose to 40 mg twice daily.  Continue sucralfate. Dexamethasone  4 mg daily. Replace electrolytes aggressively. Followed by palliative and oncology, further  management as oncology.   Hypokalemia and hypomagnesemia: Replaced.  Hypertension: Blood pressure elevated.  Amlodipine  10 mg daily, clonidine  0.3 mg 3 times daily, metoprolol  milligram twice daily.  Stable today.  COPD: On nebulizers as needed.   DVT prophylaxis: enoxaparin  (LOVENOX ) injection 40 mg Start: 06/21/23 1000   Code Status: Full code Family Communication: None at bedside Disposition Plan: Status is: Inpatient Remains inpatient appropriate because: 6.  Pain, on PCA pump     Consultants:  Oncology Palliative care  Procedures:  None  Antimicrobials:  Discontinued     Objective: Vitals:   06/22/23 1000 06/22/23 1100 06/22/23 1200 06/22/23 1336  BP:    107/79  Pulse:    83  Resp: 15 14 17 20   Temp:    98 F (36.7 C)  TempSrc:    Oral  SpO2:    99%  Weight:      Height:        Intake/Output Summary (Last 24 hours) at 06/22/2023 1350 Last data filed at 06/22/2023 1200 Gross per 24 hour  Intake 1663.68 ml  Output 900 ml  Net 763.68 ml   Filed Weights   06/21/23 0500 06/21/23 1942 06/22/23 0500  Weight: 58 kg 60.1 kg 57.2 kg    Examination:  General exam: Appears calm and comfortable on my interview.  Using Dilaudid . Respiratory system: Clear to auscultation. Respiratory effort normal. Cardiovascular system: S1 & S2 heard, RRR.  Gastrointestinal system: Soft.  Mild tenderness along the epigastrium and lateral quadrants without rigidity or guarding.  No palpable mass.  Bowel sound present. Central nervous system: Alert and oriented. No focal neurological deficits. Extremities: Symmetric 5 x 5 power. Skin: No rashes, lesions or ulcers Psychiatry: Judgement and insight appear normal. Mood & affect appropriate.  Data Reviewed: I have personally reviewed following labs and imaging studies  CBC: Recent Labs  Lab 06/19/23 0857 06/20/23 2053 06/21/23 0539  WBC 10.8* 12.7* 9.9  NEUTROABS 6.5 11.1*  --   HGB 10.8* 11.5* 10.3*  HCT 33.0* 35.2*  31.6*  MCV 87.8 86.7 88.3  PLT 387 396 311   Basic Metabolic Panel: Recent Labs  Lab 06/19/23 0857 06/20/23 2053 06/20/23 2139 06/21/23 0539 06/22/23 0415  NA 135 136  --  133*  --   K 3.3* 2.9*  --  3.4*  --   CL 95* 92*  --  95*  --   CO2 29 30  --  29  --   GLUCOSE 101* 87  --  83  --   BUN 6 8  --  7  --   CREATININE 0.41* 0.43*  --  0.37* 0.57  CALCIUM  9.7 8.9  --  8.5*  --   MG 1.9  --  1.8  --   --   PHOS  --   --   --  3.0  --    GFR: Estimated Creatinine Clearance: 73.5 mL/min (by C-G formula based on SCr of 0.57 mg/dL). Liver Function Tests: Recent Labs  Lab 06/19/23 0857 06/20/23 2053 06/21/23 0539  AST 40 32 25  ALT 18 18 16   ALKPHOS 772* 715* 606*  BILITOT 0.3 0.8 0.7  PROT 7.8 7.9 6.9  ALBUMIN 3.3* 2.9* 2.6*   Recent Labs  Lab 06/21/23 0539  LIPASE 44   No results for input(s): "AMMONIA" in the last 168 hours. Coagulation Profile: Recent Labs  Lab 06/20/23 2053  INR 1.2   Cardiac Enzymes: No results for input(s): "CKTOTAL", "CKMB", "CKMBINDEX", "TROPONINI" in the last 168 hours. BNP (last 3 results) No results for input(s): "PROBNP" in the last 8760 hours. HbA1C: No results for input(s): "HGBA1C" in the last 72 hours. CBG: No results for input(s): "GLUCAP" in the last 168 hours. Lipid Profile: No results for input(s): "CHOL", "HDL", "LDLCALC", "TRIG", "CHOLHDL", "LDLDIRECT" in the last 72 hours. Thyroid Function Tests: No results for input(s): "TSH", "T4TOTAL", "FREET4", "T3FREE", "THYROIDAB" in the last 72 hours. Anemia Panel: No results for input(s): "VITAMINB12", "FOLATE", "FERRITIN", "TIBC", "IRON", "RETICCTPCT" in the last 72 hours. Sepsis Labs: Recent Labs  Lab 06/20/23 2031 06/20/23 2300  LATICACIDVEN 0.9 0.6    Recent Results (from the past 240 hours)  Culture, blood (Routine x 2)     Status: Abnormal   Collection Time: 06/20/23  9:16 PM   Specimen: BLOOD  Result Value Ref Range Status   Specimen Description   Final     BLOOD SITE NOT SPECIFIED Performed at [facility], [address], [place], [place] [address]    Special Requests   Final    BOTTLES DRAWN AEROBIC AND ANAEROBIC Blood Culture adequate volume Performed at [facility], [address] [place], [place] [address]    Culture  Setup Time   Final    GRAM POSITIVE COCCI IN CLUSTERS IN BOTH AEROBIC AND ANAEROBIC BOTTLES CRITICAL RESULT CALLED TO, READ BACK BY AND VERIFIED WITH: PHARMD MARY SWAYNE [phone] 1550 BY J RAZZAK, MT    Culture (A)  Final    STAPHYLOCOCCUS EPIDERMIDIS STAPHYLOCOCCUS SAPROPHYTICUS STAPHYLOCOCCUS HOMINIS THE SIGNIFICANCE OF ISOLATING THIS ORGANISM FROM A SINGLE SET OF BLOOD CULTURES WHEN MULTIPLE SETS ARE DRAWN IS UNCERTAIN. PLEASE NOTIFY THE MICROBIOLOGY DEPARTMENT WITHIN ONE WEEK IF SPECIATION AND SENSITIVITIES ARE REQUIRED. Performed at [facility], [address], [place], [place] [address]    Report Status 06/22/2023 FINAL  Final  Blood Culture ID Panel (Reflexed)     Status: Abnormal   Collection Time: 06/20/23  9:16 PM  Result Value Ref Range Status   Enterococcus faecalis NOT DETECTED NOT DETECTED Final   Enterococcus Faecium NOT DETECTED NOT DETECTED Final   Listeria monocytogenes NOT DETECTED NOT DETECTED Final   Staphylococcus species DETECTED (A) NOT DETECTED Final    Comment: CRITICAL RESULT CALLED TO, READ BACK BY AND VERIFIED WITH: PHARMD MARY SWAYNE [id number] 1550 BY J RAZZAK, MT    Staphylococcus aureus (BCID) NOT DETECTED NOT DETECTED Final   Staphylococcus epidermidis DETECTED (A) NOT DETECTED Final    Comment: Methicillin (oxacillin) resistant coagulase negative staphylococcus. Possible blood culture contaminant (unless isolated from more than one blood culture draw or clinical case suggests pathogenicity). No antibiotic treatment is indicated for blood  culture contaminants. CRITICAL RESULT CALLED TO, READ BACK BY AND VERIFIED WITH: PHARMD MARY  SWAYNE [phone] 1550 BY J RAZZAK, MT    Staphylococcus lugdunensis NOT DETECTED NOT DETECTED Final   Streptococcus species NOT DETECTED NOT DETECTED Final   Streptococcus agalactiae NOT DETECTED NOT DETECTED Final   Streptococcus pneumoniae NOT DETECTED NOT DETECTED Final   Streptococcus pyogenes NOT DETECTED NOT DETECTED Final   A.calcoaceticus-baumannii NOT DETECTED NOT DETECTED Final   Bacteroides fragilis NOT DETECTED NOT DETECTED Final   Enterobacterales NOT DETECTED NOT DETECTED Final   Enterobacter cloacae complex NOT DETECTED NOT DETECTED Final   Escherichia coli NOT DETECTED NOT DETECTED Final   Klebsiella aerogenes NOT DETECTED NOT DETECTED Final   Klebsiella oxytoca NOT DETECTED NOT DETECTED Final   Klebsiella pneumoniae NOT DETECTED NOT DETECTED Final   Proteus species NOT DETECTED NOT DETECTED Final   Salmonella species NOT DETECTED NOT DETECTED Final   Serratia marcescens NOT DETECTED NOT DETECTED Final   Haemophilus influenzae NOT DETECTED NOT DETECTED Final   Neisseria meningitidis NOT DETECTED NOT DETECTED Final   Pseudomonas aeruginosa NOT DETECTED NOT DETECTED Final   Stenotrophomonas maltophilia NOT DETECTED NOT DETECTED Final   Candida albicans NOT DETECTED NOT DETECTED Final   Candida auris NOT DETECTED NOT DETECTED Final   Candida glabrata NOT DETECTED NOT DETECTED Final   Candida krusei NOT DETECTED NOT DETECTED Final   Candida parapsilosis NOT DETECTED NOT DETECTED Final   Candida tropicalis NOT DETECTED NOT DETECTED Final   Cryptococcus neoformans/gattii NOT DETECTED NOT DETECTED Final   Methicillin resistance mecA/C DETECTED (A) NOT DETECTED Final    Comment: CRITICAL RESULT CALLED TO, READ BACK BY AND VERIFIED WITH: Drexel Gentles SWAYNE [id number] 1550 BY Chestine Costain, MT Performed at [facility], [address], [place], [place] [address]   Culture, blood (Routine x 2)     Status: None (Preliminary result)   Collection Time: 06/21/23  7:03 AM    Specimen: BLOOD  Result Value Ref Range Status   Specimen Description   Final    BLOOD BLOOD RIGHT HAND Performed at [facility], [address] [place], [place] [address]    Special Requests   Final    BOTTLES DRAWN AEROBIC AND ANAEROBIC Blood Culture adequate volume Performed at [facility], [address] [place], [place] [address]    Culture   Final    NO GROWTH < 24 HOURS Performed at [facility], [address] [place], [place] [address]    Report Status PENDING  Incomplete  Radiology Studies: CT ABDOMEN PELVIS W CONTRAST Result Date: 06/21/2023 CLINICAL DATA:  Abdominal EXAM: CT ABDOMEN AND PELVIS WITH CONTRAST TECHNIQUE: Multidetector CT imaging of the abdomen and pelvis was performed using the standard protocol following bolus administration of intravenous contrast. RADIATION DOSE REDUCTION: This exam was performed according to the departmental dose-optimization program which includes automated exposure control, adjustment of the mA and/or kV according to patient size and/or use of iterative reconstruction technique. CONTRAST:  75mL OMNIPAQUE  IOHEXOL  350 MG/ML SOLN COMPARISON:  06/07/2023 FINDINGS: Lower chest: See chest CT report Hepatobiliary: Biliary stents remain in place, unchanged. Mild periportal edema is similar to prior study. Heterogeneous enhancement. Peripheral hypodensities along the surface of the liver, presumably capsular/peritoneal disease. Small amount of perihepatic ascites. Findings stable since prior study. Pancreas: Slight ductal dilatation, stable since prior study. No perihepatic edema or focal abnormality. Spleen: No focal abnormality.  Normal size. Adrenals/Urinary Tract: Bilateral nephrolithiasis, the largest 11 mm in the left midpole. No ureteral stones or hydronephrosis. Adrenal glands and urinary bladder unremarkable. Stomach/Bowel: Again noted is rectal wall thickening, unchanged since prior study.  Moderate to large stool burden throughout the colon. Stomach and small bowel decompressed. No bowel obstruction. Vascular/Lymphatic: Aortoiliac atherosclerosis. No evidence of aneurysm or adenopathy. Reproductive: Left pelvic cyst measures 5.5 cm compared to 5 cm previously, likely left ovarian in nature. Uterus and right adnexa unremarkable. Other: Bulky peritoneal disease noted in the right abdomen and along the liver surface, stable since prior study. 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 No focal or suspicious osseous abnormality. IMPRESSION: Stable heterogeneous enhancement throughout the liver with nodularity along the surface of the liver, likely capsular/peritoneal disease. Small amount of perihepatic ascites. Stable nodularity in the right abdomen, likely peritoneal/omental metastatic disease, stable. Stable biliary ductal stents. Bilateral nephrolithiasis.  No hydronephrosis. Moderate to large stool burden. Circumferential rectal wall thickening/mass, unchanged. Electronically Signed   By: Janeece Mechanic M.D.   On: 06/21/2023 01:44   CT Angio Chest PE W/Cm &/Or Wo Cm Result Date: 06/21/2023 CLINICAL DATA:  Chest pain EXAM: CT ANGIOGRAPHY CHEST WITH CONTRAST TECHNIQUE: Multidetector CT imaging of the chest was performed using the standard protocol during bolus administration of intravenous contrast. Multiplanar CT image reconstructions and MIPs were obtained to evaluate the vascular anatomy. RADIATION DOSE REDUCTION: This exam was performed according to the departmental dose-optimization program which includes automated exposure control, adjustment of the mA and/or kV according to patient size and/or use of iterative reconstruction technique. CONTRAST:  75mL OMNIPAQUE  IOHEXOL  350 MG/ML SOLN COMPARISON:  05/28/2023 FINDINGS: Cardiovascular: Left PICC line remains in place with the tip at the cavoatrial junction. No filling defects in the pulmonary arteries to suggest  pulmonary emboli. Mild cardiomegaly. Aorta normal caliber. Mediastinum/Nodes: No mediastinal, hilar, or axillary adenopathy. Trachea and esophagus are unremarkable. Thyroid unremarkable. 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 Left lower lobe pulmonary nodule on image 87 measures 3 mm, stable since prior study. Nodule along the left major fissure in the left lower lobe on image 77 is unchanged. Bibasilar atelectasis. Dependent atelectasis in the left lower lobe. No effusions. Upper Abdomen: No acute findings Musculoskeletal: Chest wall soft tissues are unremarkable. No acute bony abnormality. Review of the MIP images confirms the above findings. IMPRESSION: No evidence of pulmonary embolus. Mild cardiomegaly. Small left lower lobe pulmonary nodules measuring up to 3 mm are unchanged since prior study. Dependent and bibasilar atelectasis. Electronically Signed   By: Janeece Mechanic M.D.   On:  06/21/2023 01:37   DG Chest Port 1 View Result Date: 06/20/2023 CLINICAL DATA:  Chest pain EXAM: PORTABLE CHEST 1 VIEW COMPARISON:  05/22/2023 FINDINGS: The heart size and mediastinal contours are within normal limits. Both lungs are clear. The visualized skeletal structures are unremarkable. IMPRESSION: No active disease. Electronically Signed   By: Juanetta Nordmann M.D.   On: 06/20/2023 20:49        Scheduled Meds:  amLODipine   10 mg Oral Daily   Chlorhexidine  Gluconate Cloth  6 each Topical Daily   cloNIDine   0.3 mg Oral TID   dexamethasone   4 mg Oral Daily   enoxaparin  (LOVENOX ) injection  40 mg Subcutaneous Q24H   feeding supplement  1 Container Oral TID BM   HYDROmorphone    Intravenous Q4H   lactulose   20 g Oral BID   metoprolol  tartrate  100 mg Oral BID   oxyCODONE   20 mg Oral Q12H   pantoprazole   40 mg Oral BID   senna  2 tablet Oral QHS   sodium chloride  flush  3 mL Intravenous Q12H   sucralfate  1 g Oral TID WC & HS   Continuous Infusions:  lactated  ringers  Stopped (06/20/23 2331)   lactated ringers  100 mL/hr at 06/22/23 1240   promethazine  (PHENERGAN ) injection (IM or IVPB) Stopped (06/20/23 2228)     LOS: 1 day    Time spent: 55 minutes    Vada Garibaldi, MD [facility]

## 2023-06-22 NOTE — Progress Notes (Signed)
 Daily Progress Note   Patient Name: April [name]       Date: 06/22/2023 DOB: 25-May-1975  Age: 48 y.o. MRN#: [id number] Attending Physician: Vada Garibaldi, MD Primary Care Physician: Annella Kief, NP Admit Date: 06/20/2023 Length of Stay: 1 day  Reason for Consultation/Follow-up: Establishing goals of care and Pain control  Subjective:   CC: Patient feeling pain more managed with PCA.  Following up regarding complex medical decision making and symptom management.  Subjective:  Reviewed recent EMR documentation from hospitalist and oncologist. At time of EMR review in past 24 hours patient has received IV Dilaudid  via PCA bolus 0.2 mg x 36 boluses.  Patient also received additional as needed IV Dilaudid  1 mg x 2 doses.  Reviewed EKG obtained on 06/22/2023 noting continued prolonged QTc.  Presented to bedside to meet with patient.  Able to follow-up regarding patient's symptom management at this time.  Patient does feel the PCA has assist with pain management.  Discussed based on patient's OME requirements, recommend to start long-acting medication to allow for more continuous pain management.  Patient would prefer to start on OxyContin  over morphine  as she previously felt morphine  did not assist with her pain management at all.  Discussed starting OxyContin  20 mg every 12 hours today.  Patient agreeing with this plan.  Noted ultimately patient would need long-acting pain medication and short acting pain medication for management that she can take orally once she leaves the hospital.  Patient acknowledging this.  Noted while starting long-acting opioid today, would continue bolus dosing through PCA to assist with continuing to adjust medications based on doses required for her pain management.  Patient agreement with this plan.  ------------------------------------------------------------------------------------------------------------- Advance Care Planning Conversation  Pertinent  diagnosis: Metastatic colon cancer  The patient and/or family consented to a voluntary Advance Care Planning Conversation in person. Individuals present for the conversation: This palliative medicine provider discussed entirety of conversation with patient.  Summary of the conversation: Patient noted she had discussed care with Dr. Scherrie Curt today.  Patient being referred to [facility] for clinical trial potentially.  With permission, able to inquire about patient's advance care planning.  Patient has not completed advance care planning documentation.  Inquired about patient's family support.  Patient notes that her primary support is her mother.  Patient is not married.  Patient does have 3 children who are at the age of 7 or older.  Inquired if patient would want to make medical decisions on her behalf she was unable to make medical decisions for herself.  Patient noted that she would want her mother to make these decisions.  Discussed that patient needs to complete HCPOA documentation stating this since [place]  the majority of her children would be the ones to make decisions if she was unable to make medical decisions for herself without proper documentation stating otherwise.  Patient does not want her children to have to make these kind of decisions.  Acknowledged this note it would involve chaplain to assist with HCPOA completion. Also able to discuss CODE STATUS.  Explained full code versus DNR/DNI.  Expressed concern that if patient were sick enough that her heart were to stop or she would stop breathing, interventions such as cardiac resuscitation or intubation would be critical believe she would not lead to quality of life outcomes with patient's underlying metastatic colon cancer which cannot be cured.  Patient will consider this further.  Spent time providing emotional support since is difficult subject to discuss.  Outcome  of the conversations and/or documents completed:  Patient want to meet  with chaplain to complete HCPOA documentation naming her mother as her HCPOA.  Patient further considering CODE STATUS.  Currently remains full code and considering if would like to change to DNR/DNI.  I spent 25 minutes providing separately identifiable ACP services with the patient and/or surrogate decision maker in a voluntary, in-person conversation discussing the patient's wishes and goals as detailed in the above note.  Barnett Libel, DO Palliative Medicine Provider  -------------------------------------------------------------------------------------------------------------  All questions answered at that time.  Noted palliative medicine team continue to follow with patient's medical journey.  Discussed care with hospitalist and RN to coordinate care.  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  Patient had last received chemotherapy on 06/19/2023.  Palliative medicine team consulted to assist with complex medical decision making and symptom management. Of note, patient has previously been seen by inpatient palliative  medicine team.  Recommendations/Plan: # Complex medical decision making/goals of care:  - Discussed care with patient as detailed above in HPI.  Spent time exploring patient's goals for medical care moving forward.  Patient noted that she had discussed care with Dr. Scherrie Curt who is referring her to [facility] for clinical trial/salvage therapy.  Noted importance of continuing conversations with care team moving forward.  - Discussed importance of advance care planning documentation with patient.  Patient has 3 children over the age of 39.  Patient felt that she would want her mother to make medical decisions on her behalf if she was unable to make them for herself.  Noted patient needed to complete HCPOA documentation stating this.  Patient willing to do this.  Will engage chaplain to assist with completion of this.  -  Code Status: Full Code   - Discussed CODE STATUS with patient as detailed above in HPI.  Explained full code versus DNR/DNI.  Expressed concern that with patient's underlying metastatic colon cancer, if she were sick enough for her to stop her show to stop breathing interventions such as intubation and cardiac resuscitation would not lead to quality of life outcomes.  Recommended patient consider changing CODE STATUS to DNR/DNI.  Patient to consider this further.  # Symptom management: Patient is receiving these palliative interventions for symptom management with an intent to improve quality of life.      - Pain, severe in setting of metastatic rectal adenocarcinoma Within the past 24 hours hours, patient has required 9.2 mg IV Dilaudid  for opioid management. Based on [place] calculated for this dose, which would be approximately 58 when reducing by 50% for incomplete cross tolerance, will appropriately start patient on  the listed regimen below.    - Start OxyContin  20 mg every 12 hours during the day for long-acting opioid management.                               -Continue IV Dilaudid  PCA 0.2  mg daily 10-minute as needed bolus.  No continuous infusion dose.  Continuing PCA to allow for fine-tuning of opioid dosing for oral medications can take on discharge.                 - Nausea/vomiting                               - Personally reviewed EKG on 6/7 which noted QTc prolonged at 501.  Prior EKG on 06/07/2023 noted QTc 469.  Discussed with hospitalist recommended to avoid QTc prolonging medications if able.   - EKG on 05/22/2023 showed QTc remains prolonged at 501.  Continue to monitor intermittently or as recommended by hospitalist.  Avoid QTc prolonging medications as able.                               -Continue as needed Ativan  0.5 mg every 6 hours as needed nausea/vomiting                  - Constipation Hospitalist has started medications for management.                               - Receiving lactulose  20 mg twice daily.  Continue to adjust based on response                               - Receiving senna 2 tabs daily, continue to adjust based on response  # Psychosocial Support:  - Mother, 3 children over the age of 66, sisters  # Discharge Planning: To Be Determined  Discussed with: Patient, hospitalist, RN, chaplain  Thank you for allowing the palliative care team to participate in the care [facility].  Barnett Libel, DO Palliative Care Provider PMT # [phone]  If patient remains symptomatic despite maximum doses, please call PMT at [phone] between 0700 and 1900. Outside of these hours, please call attending, as PMT does not have night coverage.

## 2023-06-22 NOTE — Progress Notes (Signed)
 Referral faxed to [facility] for appt with Dr Nino Bass  faxed to [phone]

## 2023-06-22 NOTE — Plan of Care (Signed)

## 2023-06-23 DIAGNOSIS — R9431 Abnormal electrocardiogram [ECG] [EKG]: Secondary | ICD-10-CM

## 2023-06-23 DIAGNOSIS — Z515 Encounter for palliative care: Secondary | ICD-10-CM | POA: Diagnosis not present

## 2023-06-23 DIAGNOSIS — R110 Nausea: Secondary | ICD-10-CM

## 2023-06-23 DIAGNOSIS — C787 Secondary malignant neoplasm of liver and intrahepatic bile duct: Secondary | ICD-10-CM | POA: Diagnosis not present

## 2023-06-23 DIAGNOSIS — Z79899 Other long term (current) drug therapy: Secondary | ICD-10-CM

## 2023-06-23 DIAGNOSIS — C20 Malignant neoplasm of rectum: Secondary | ICD-10-CM

## 2023-06-23 DIAGNOSIS — Z7189 Other specified counseling: Secondary | ICD-10-CM

## 2023-06-23 DIAGNOSIS — E861 Hypovolemia: Secondary | ICD-10-CM | POA: Diagnosis not present

## 2023-06-23 MED ORDER — OXYCODONE HCL ER 20 MG PO T12A
20.0000 mg | EXTENDED_RELEASE_TABLET | Freq: Three times a day (TID) | ORAL | Status: DC
  Administered 2023-06-23 – 2023-06-26 (×8): 20 mg via ORAL
  Filled 2023-06-23 (×9): qty 1

## 2023-06-23 NOTE — Progress Notes (Signed)
 IVT consult placed to assess pt's PICC line d/t IV pump alarming frequently and blood return has been inconsistent. Pt was seen at bedside.  Long-standing PICC line with positional issues, kinking noted in upper arm via CXR on 06/20/23.  The line flushes and infuses well when the patient is in favorable positions. No indication of infection, infiltration or occulusion due to thrombus.  TPA is not indication at this time. Discussed with primary RN and oncology MD, would recommend a tunnelled line to reduce this risk in the future, Pt however reluctant to discuss any alternate vascular access options.  Per MD plan to continue use of current PICC line.    Recommend patient and care team to maintain favorable position and powerflush PRN  to ensure line functionality. No further action from IVT unless there is a change in clinical status or signs of line complication.

## 2023-06-23 NOTE — Progress Notes (Signed)
 IP PROGRESS NOTE  Subjective:   April [name] reports feeling much better after having multiple bowel movements yesterday.  She continues to have abdominal pain, though this has improved. Objective: Vital signs in last 24 hours: Blood pressure (!) 150/112, pulse 74, temperature 98.7 F (37.1 C), temperature source Oral, resp. rate 18, height 5\' 4"  (1.626 m), weight 128 lb 4.9 oz (58.2 kg), last menstrual period 01/07/2006, SpO2 100%.  Intake/Output from previous day: 06/09 0701 - 06/10 0700 In: 2754.7 [P.O.:957; I.V.:1797.7] Out: -   Physical Exam:   Abdomen: No mass, nondistended, nontender Extremities: No leg edema   Portacath/PICC-without erythema  Lab Results: Recent Labs    06/20/23 2053 06/21/23 0539  WBC 12.7* 9.9  HGB 11.5* 10.3*  HCT 35.2* 31.6*  PLT 396 311    BMET Recent Labs    06/20/23 2053 06/21/23 0539 06/22/23 0415  NA 136 133*  --   K 2.9* 3.4*  --   CL 92* 95*  --   CO2 30 29  --   GLUCOSE 87 83  --   BUN 8 7  --   CREATININE 0.43* 0.37* 0.57  CALCIUM  8.9 8.5*  --     Lab Results  Component Value Date   CEA1 <0.6 03/23/2022   CEA 52.60 (H) 06/19/2023   YQM578 213 (H) 03/23/2022    Studies/Results: No results found.   Medications: I have reviewed the patient's current medications.  Assessment/Plan: Sigmoid colon cancer, stage IV (pT4a,pN2b,M1c) Colonoscopy 03/24/2019-3 rectal polyps-hyperplastic polyps, distal colon biopsy-at least intramucosal adenocarcinoma, completely obstructing mass in the distal sigmoid colon, could not be traversed, intact mismatch repair protein expression 03/24/2019-CEA 56.1 03/29/2019 CT abdomen/pelvis-circumferential thickening involving the entire mid and distal sigmoid colon to the level of the rectosigmoid junction, solid/cystic mass in the left ovary, bilateral nephrolithiasis Robotic assisted low anterior resection, mesenteric lymphadenectomy, bilateral salpingo-oophorectomy 04/08/2019 Pathology ([facility]  review of outside pathology) metastatic adenocarcinoma involving the peritoneum overlying the round ligament, serosa of the urinary bladder, serosa of the right ureter a sacral area, pelvic peritoneum, and left ovary.  Omental biopsy with focal mucin pools with no carcinoma cells identified, right hemidiaphragm biopsy involved by metastatic adenocarcinoma, invasive adenocarcinoma the sigmoid colon, moderately differentiated, T4a, perineural and vascular invasion present, 7/22 lymph nodes, multiple tumor deposits, resection margins negative Negative for PD-L1, low probability of MSI-high, HER-2 negative, negative for BRAF, NRAS and KRAS alterations CTs 05/19/2019-no evidence of metastatic disease, findings suspicious for colitis of the transverse and ascending colon, small amount of ascites in the cul-de-sac, bilateral renal calculi Cycle 1 FOLFIRI 05/24/2019, bevacizumab  added with cycle 2 Cycle 4 FOLFIRI/bevacizumab  07/05/2019 Cycle 5 FOLFIRI 07/27/2019, bevacizumab  held secondary to hypertension Cycle 6 FOLFIRI 08/10/1999, bevacizumab  held secondary to hypertension CTs 08/30/2019-no evidence of recurrent disease, new subsolid right lower lobe nodule felt to be inflammatory, emphysema Cycle 7 FOLFIRI 09/26/2019, bevacizumab  remains on hold secondary to hypertension Cycle 8 FOLFIRI 10/17/2019, bevacizumab  held secondary to hypertension, Udenyca  added for neutropenia Cycle 9 FOLFIRI 11/07/2019, bevacizumab  held secondary to hypertension, Udenyca  Cycle 10 FOLFIRI 11/28/2019, bevacizumab  held, Udenyca  Cycle 11 FOLFIRI 12/26/2019, bevacizumab  held, Udenyca  CTs 01/25/2020-no evidence of recurrent disease, resolution of right lower lobe nodule Maintenance Xeloda  beginning 02/06/2020 CTs 04/25/2020- no evidence of metastatic disease, multiple bilateral renal calculi without hydronephrosis Maintenance Xeloda  continued CT abdomen/pelvis without contrast 08/10/2020-bilateral staghorn renal calculi, no evidence of  metastatic disease; addendum 08/23/2020-small but increasing omental nodules. Cycle 1 FOLFIRI/panitumumab  09/19/2020 Cycle 2 FOLFIRI/Panitumumab  10/03/2020 Cycle 3 FOLFIRI/Panitumumab  10/17/2020 Cycle  4 FOLFIRI/panitumumab  10/31/2020 Cycle 5 FOLFIRI/Panitumumab  11/14/2020 CTs 11/26/2020-stable omental metastases compared to 10/22/2020, mildly decreased from 08/10/2020.  Left lower quadrant tiny paracolic gutter implant slightly decreased from CT 08/10/2020.  No new or progressive metastatic disease in the abdomen or pelvis. Cycle 6 FOLFIRI/Panitumumab  11/28/2020, irinotecan  dose reduced, treatment schedule adjusted to every 3 weeks going forward Cycle 7 FOLFIRI/panitumumab  12/24/2020 Cycle 8 FOLFIRI/Panitumumab  01/16/2021--treatment held due to hypertension in the infusion area. Cycle 8 FOLFIRI/panitumumab  01/21/2021 Cycle 9 FOLFIRI/panitumumab  02/11/2021 Cycle 10 FOLFIRI/Panitumumab  03/04/2021 Cycle 11 FOLFIRI/Panitumumab  03/25/2021 CT abdomen/pelvis 04/10/2021-stable right omental and left posterior paracolic gutter nodules Cycle 12 FOLFIRI/panitumumab  04/15/2021 Cycle 13 FOLFIRI/panitumumab  05/06/2021 Cycle 14 FOLFIRI/Panitumumab  05/27/2021 Cycle 15 FOLFIRI/Panitumumab  06/17/2021 CT abdomen/pelvis 07/05/2021-slight enlargement of a dominant right omental implant, no new implants Patient requested a treatment break CT abdomen/pelvis 10/04/2021-mild increase in size of multiple omental soft tissue nodules, 4 mm calculus in the distal left ureter adjacent to the ureteral stent Cycle 1 Lonsurf  10/14/2021 10/28/2021 Avastin  every 2 weeks Cycle 2 Lonsurf  11/11/2021 Cycle 3 Lonsurf  12/09/2021 Cycle 4 Lonsurf  01/06/2022 Avastin  held 01/20/2022 due to proteinuria, 24-hour urine 43 mg CT/pelvis 01/30/2022-possible new peritoneal implant at the transverse colon, no significant change in other omental/peritoneal implants, stable chronic rectal wall thickening, status post removal of double-J left ureteral stent with no  evidence of hydronephrosis or ureteral calculus, nonobstructing bilateral renal calculi Cycle 5 Lonsurf  02/05/2022 or 02/06/2022 Avastin  every 2 weeks Cycle 6 Lonsurf  03/03/2022 CTs 04/05/2022-increase in size and number of peritoneal implants compared to January 2024 central liver mass narrowing the anterior branch of the right portal vein, decreased biliary duct dilation, biliary stents in place, stable right cardiophrenic lymph nodes, separate pigtail stent in the lumen of the proximal duodenum Cycle 1 FOLFOX 04/14/2022 CT abdomen/pelvis 04/16/2022-stent within the third portion of the duodenum, stable peritoneal nodules, indwelling biliary stents with mild left biliary duct dilation Cycle 2 FOLFOX 04/28/2022, Emend and prophylactic dexamethasone  added Cycle 3 FOLFOX 05/12/2022, Aloxi , Emend, prophylactic dexamethasone , Compazine  and lorazepam  as needed Chemotherapy held 05/25/2020 due to severe hypertension, evaluated in the emergency department Cycle 4 FOLFOX 06/03/2022 Cycle 5 FOLFOX 06/16/2022, oxaliplatin  dose reduced and 5-FU bolus eliminated due to neutropenia CT 06/25/2022-unchanged soft tissue at the hepatic hilum, unchanged, bile duct stent, decreased size of peritoneal and omental nodules and diminished volume of likely fluid in the lower left pelvis Cycle 6 FOLFOX 06/30/2022 Cycle 7 FOLFOX 07/21/2022 CT 07/24/2022 (in the emergency department with nausea, headache, abdominal pain)-no acute pathology.  No significant interval change in omental nodularity. Cycle 8 FOLFOX 08/04/2022, pruritus on the palms at the end of the oxaliplatin  infusion, Pepcid  and Benadryl  given, symptoms resolved, infusion not resumed Cycle 9 FOLFOX 08/18/2022-Pepcid  and Benadryl  added to premedications, Oxaliplatin  diluted in a larger volume and infusion time increased Cycle 10 FOLFOX 09/08/2022 Cycle 11 FOLFOX 09/22/2022 10/06/2022 patient declined treatment CTs 10/15/2022-similar soft tissue fullness in the hepatic hilum with  biliary stents in place, decrease in omental/peritoneal metastases Maintenance capecitabine  10/26/2022 CT abdomen/pelvis 11/19/2022-mild increase in tumor studding at the left paracolic gutter and omentum, circumferential wall thickening of the rectum, biliary stents in place with minimal increase in intrahepatic biliary dilation, nonobstructive renal calculi Colonoscopy 11/23/2022-anastomosis at 15 cm from the anal verge-patent, 2 cm polypoid tissue at the anastomosis biopsied, invasive adenocarcinoma moderately differentiated, preserved expression of major MMR proteins CTs 12/15/2022: Possible developing nodule in the lateral left liver, increased omental nodularity, persistent wall thickening in the upper rectum CT abdomen/pelvis 12/24/2022: Increased thickened fold in the transverse  colon-infectious/inflammatory, 2.1 cm lateral left liver lesion, potential new 5 mm segment 5 lesion, stable biliary stents with intrahepatic biliary dilation, central biliary wall thickening near the hepatic hilum, multiple peritoneal/omental implants unchanged from 12/15/2022, but likely larger than 11/19/2022 Cycle 1 irinotecan /Panitumumab  01/06/2023 Cycle 2 irinotecan /Panitumumab  01/20/2023 CTs 01/24/2023-slight increase in omental nodularity. Cycle 3 irinotecan /Panitumumab  02/03/2023 Cycle 4 irinotecan /panitumumab  02/17/2023 CTs abdomen/pelvis 02/24/2023-subtly hypoattenuating lesion within the peripheral aspect of hepatic segment 2 redemonstrated.  Additional scattered subcentimeter low-density lesions within the liver too small to characterize.  Prominent intrahepatic biliary dilatation with biliary stents in place.  Similar degree of nodularity along the omentum and left paracolic gutter.  No ascites. CTs 03/09/2023-indwelling endoscopically placed stents; level of intrahepatic biliary duct dilatation is slightly improved on the left and some on the right.  Gallbladder is dilated with wall edema which is increasing.  More  nodular tissue extending up along the porta hepatis.  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  Rectum with nodular wall thickening with extension into the adjacent tissue, nodular areas seen extending perirectal and presacral spaces.  Peritoneal carcinomatosis again identified. Irinotecan /Panitumumab  04/02/2023 Irinotecan /panitumumab  04/16/2023 Irinotecan /Panitumumab  05/07/2023 Irinotecan /Panitumumab  06/19/2023   Hypertension G4 P3, twins Kidney stones-bilateral staghorn renal calculi on CT 08/10/2020 Infected Port-A-Cath 09/12/2019-placed on Augmentin , referred for Port-A-Cath removal; Port-A-Cath removed 09/14/2019; PICC line placed 09/14/2019; culture staph aureus.  Course of Septra  completed. Neutropenia secondary to chemotherapy-Udenyca  added with cycle 8 FOLFIRI Right nephrostomy tube 09/12/2020; stent placed 10/09/2020, percutaneous nephrostolithotomy treatment of right-sided kidney stones 10/09/2020, malpositioned right ureter stent replacement 10/23/2020, right ureter stent removed 10/29/2020 Percutaneous left nephrostolithotomy 10/01/2021-no renal stone identified, left ureter stent left in place Cystoscopy 10/14/2021-stent removed 8.  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  2 moderate biliary strictures found in the hepatic duct system, malignant appearing.  Left and right hepatic duct and all intrahepatic  branches were moderately dilated secondary to the strictures.  The strictures were dilated.  Plastic stent placed into the left hepatic duct and right hepatic duct. 9.  Admission 04/16/2022 with nausea/vomiting and abdominal pain, felt to be acute toxicity related to chemotherapy, discharged home 04/18/2022 10.  Admission 11/19/2022 with nausea/vomiting and abdominal pain-improved 11.  Admission 12/15/2022 with fever and right submandibular swelling/tenderness CT neck 12/15/2022: 2 mm distal right submandibular duct calculus with mild inflammatory change adjacent to the right submandibular gland 12.  Admission 12/24/2022 with a fever nausea/vomiting, diarrhea, Klebsiella bacteremia completed course of Augmentin  13.  Admission with abdominal pain and nausea/vomiting 03/09/2023 through 03/14/2023-concern for gallbladder inflammation.  Not felt to be a surgical candidate.  Percutaneous cholecystostomy tube placed 03/11/2023.  Tube removed 04/24/2023. 14.  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  She was admitted 06/20/2023 with nausea and abdominal pain.  I suspect the acute symptoms were related to irinotecan  received on 06/19/2023.  She has chronic abdominal pain secondary to carcinomatosis with a dominant mass in the right abdomen.  Her  pain has generally been under good control with oral Dilaudid  and she typically has bowel movements. The current constipation is likely secondary to carcinomatosis and narcotics.  The pain improved significantly following bowel movements yesterday.  She is now maintained on OxyContin  and IV Dilaudid  for pain. I discussed treatment options with her again today.  We are making a referral to Dr. Nino Bass at [facility].  She has seen April [name] in the past.  April [name] may be a candidate for a dual agent immunotherapy trial at [facility].  She says she has  transportation to [employer].  She appears stable for discharge to home when the pain is controlled with an oral regimen.  Recommendations: Continue pain regimen as recommended by pelvic care medicine, add oral narcotic for breakthrough pain Bowel regimen Refer to Dr. Nino Bass at [facility] to consider salvage systemic treatment options and clinical trial eligibility Outpatient follow-up as scheduled at the cancer center      LOS: 2 days   Coni Deep, MD   06/23/2023, 6:56 AM

## 2023-06-23 NOTE — Progress Notes (Signed)
 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  Last chemotherapy received 6/6.  Worsening abdominal pain and rectal discomfort.  Last bowel movement about 6 days ago. In the emergency room, afebrile.  Blood pressure is stable.  Potassium 2.9.  WBC count 12.7.  Creatinine normal. CT angio of the chest without evidence of PE or consolidation. CT scan of the abdomen pelvis with contrast, evidence of metastatic disease, no evidence of acute infection or obstruction.  Admitted with IV fluids and antibiotics. Palliative care actively managing pain.  Subjective: Patient seen and examined.  She was able to tolerate regular diet.  Lactulose  did pretty well and she had multiple bowel movements.  Still has some discomfort while passing stool but she understands this is going to be the case.  On Dilaudid  PCA and managed by palliative care team.  She is not sure whether oral medication will help her.  Assessment & Plan:  Intractable nausea vomiting and abdominal pain due to chemotherapy. Severe intractable abdominal pain due to metastatic cancer Intra-abdominal infection suspected, ruled out.  Blood cultures were drawn that shows Staph epidermidis which is likely contaminant.  Case discussed with infectious disease.  Antibiotics discontinued. Continue supportive care.  Advance diet as tolerated.  Now tolerating soft diet. Severe cancer-related pain, appreciate palliative care input.  Patient currently on Dilaudid  PCA.  Continue Protonix  with increased dose to 40 mg twice daily.  Continue  sucralfate. Dexamethasone  4 mg daily. Replace electrolytes aggressively. Followed by palliative and oncology, further management as oncology.  Hypokalemia and hypomagnesemia: Replaced.  Hypertension: Blood pressure elevated.  Amlodipine  10 mg daily, clonidine  0.3 mg 3 times daily, metoprolol  milligram twice daily.  Stable today.  COPD: On nebulizers as needed.   DVT prophylaxis: enoxaparin  (LOVENOX ) injection 40 mg Start: 06/21/23 1000   Code Status: Full code Family Communication: None at bedside Disposition Plan: Status is: Inpatient Remains inpatient appropriate because: Pain, on PCA pump     Consultants:  Oncology Palliative care  Procedures:  None  Antimicrobials:  Discontinued     Objective: Vitals:   06/23/23 0558 06/23/23 0805 06/23/23 0946 06/23/23 1229  BP: (!) 150/112  (!) 140/99 (!) 125/92  Pulse: 74  70 70  Resp: 18 16 16 16   Temp: 98.7 F (37.1 C)  98.3 F (36.8 C) 98.4 F (36.9 C)  TempSrc: Oral  Oral Oral  SpO2: 100% 99% 100% 100%  Weight: 58.2 kg     Height:        Intake/Output Summary (Last 24 hours) at 06/23/2023 1406 Last data filed at 06/23/2023 1040 Gross per 24 hour  Intake 2040.72 ml  Output --  Net 2040.72 ml   Filed Weights   06/21/23 1942 06/22/23 0500 06/23/23 0558  Weight: 60.1 kg 57.2 kg 58.2 kg    Examination:  General exam: Looks fairly comfortable today.  On PCA pump. Respiratory system: Clear to auscultation. Respiratory effort normal. Cardiovascular system: S1 & S2 heard, RRR.  Gastrointestinal system: Soft.  Mild tenderness along the epigastrium and lateral quadrants without rigidity or guarding.  No palpable mass.  Bowel sound present. Central nervous system: Alert and oriented. No focal neurological deficits. Extremities: Symmetric 5  x 5 power. Skin: No rashes, lesions or ulcers Psychiatry: Judgement and insight appear normal. Mood & affect appropriate.     Data Reviewed: I have personally reviewed  following labs and imaging studies  CBC: Recent Labs  Lab 06/19/23 0857 06/20/23 2053 06/21/23 0539  WBC 10.8* 12.7* 9.9  NEUTROABS 6.5 11.1*  --   HGB 10.8* 11.5* 10.3*  HCT 33.0* 35.2* 31.6*  MCV 87.8 86.7 88.3  PLT 387 396 311   Basic Metabolic Panel: Recent Labs  Lab 06/19/23 0857 06/20/23 2053 06/20/23 2139 06/21/23 0539 06/22/23 0415  NA 135 136  --  133*  --   K 3.3* 2.9*  --  3.4*  --   CL 95* 92*  --  95*  --   CO2 29 30  --  29  --   GLUCOSE 101* 87  --  83  --   BUN 6 8  --  7  --   CREATININE 0.41* 0.43*  --  0.37* 0.57  CALCIUM  9.7 8.9  --  8.5*  --   MG 1.9  --  1.8  --   --   PHOS  --   --   --  3.0  --    GFR: Estimated Creatinine Clearance: 73.5 mL/min (by C-G formula based on SCr of 0.57 mg/dL). Liver Function Tests: Recent Labs  Lab 06/19/23 0857 06/20/23 2053 06/21/23 0539  AST 40 32 25  ALT 18 18 16   ALKPHOS 772* 715* 606*  BILITOT 0.3 0.8 0.7  PROT 7.8 7.9 6.9  ALBUMIN 3.3* 2.9* 2.6*   Recent Labs  Lab 06/21/23 0539  LIPASE 44   No results for input(s): "AMMONIA" in the last 168 hours. Coagulation Profile: Recent Labs  Lab 06/20/23 2053  INR 1.2   Cardiac Enzymes: No results for input(s): "CKTOTAL", "CKMB", "CKMBINDEX", "TROPONINI" in the last 168 hours. BNP (last 3 results) No results for input(s): "PROBNP" in the last 8760 hours. HbA1C: No results for input(s): "HGBA1C" in the last 72 hours. CBG: No results for input(s): "GLUCAP" in the last 168 hours. Lipid Profile: No results for input(s): "CHOL", "HDL", "LDLCALC", "TRIG", "CHOLHDL", "LDLDIRECT" in the last 72 hours. Thyroid Function Tests: No results for input(s): "TSH", "T4TOTAL", "FREET4", "T3FREE", "THYROIDAB" in the last 72 hours. Anemia Panel: No results for input(s): "VITAMINB12", "FOLATE", "FERRITIN", "TIBC", "IRON", "RETICCTPCT" in the last 72 hours. Sepsis Labs: Recent Labs  Lab 06/20/23 2031 06/20/23 2300  LATICACIDVEN 0.9 0.6    Recent Results  (from the past 240 hours)  Culture, blood (Routine x 2)     Status: Abnormal   Collection Time: 06/20/23  9:16 PM   Specimen: BLOOD  Result Value Ref Range Status   Specimen Description   Final    BLOOD SITE NOT SPECIFIED Performed at [facility], [address], [place], [place] [address]    Special Requests   Final    BOTTLES DRAWN AEROBIC AND ANAEROBIC Blood Culture adequate volume Performed at [facility], [address] [place], [place] [address]    Culture  Setup Time   Final    GRAM POSITIVE COCCI IN CLUSTERS IN BOTH AEROBIC AND ANAEROBIC BOTTLES CRITICAL RESULT CALLED TO, READ BACK BY AND VERIFIED WITH: PHARMD MARY SWAYNE [id number] 1550 BY J RAZZAK, MT    Culture (A)  Final    STAPHYLOCOCCUS EPIDERMIDIS STAPHYLOCOCCUS SAPROPHYTICUS STAPHYLOCOCCUS HOMINIS THE SIGNIFICANCE OF ISOLATING THIS ORGANISM FROM A SINGLE SET OF BLOOD CULTURES WHEN MULTIPLE SETS  ARE DRAWN IS UNCERTAIN. PLEASE NOTIFY THE MICROBIOLOGY DEPARTMENT WITHIN ONE WEEK IF SPECIATION AND SENSITIVITIES ARE REQUIRED. Performed at [facility], [address], [place], [place] [address]    Report Status 06/22/2023 FINAL  Final  Blood Culture ID Panel (Reflexed)     Status: Abnormal   Collection Time: 06/20/23  9:16 PM  Result Value Ref Range Status   Enterococcus faecalis NOT DETECTED NOT DETECTED Final   Enterococcus Faecium NOT DETECTED NOT DETECTED Final   Listeria monocytogenes NOT DETECTED NOT DETECTED Final   Staphylococcus species DETECTED (A) NOT DETECTED Final    Comment: CRITICAL RESULT CALLED TO, READ BACK BY AND VERIFIED WITH: PHARMD MARY SWAYNE [id number] 1550 BY J RAZZAK, MT    Staphylococcus aureus (BCID) NOT DETECTED NOT DETECTED Final   Staphylococcus epidermidis DETECTED (A) NOT DETECTED Final    Comment: Methicillin (oxacillin) resistant coagulase negative staphylococcus. Possible blood culture contaminant (unless isolated from more than one blood  culture draw or clinical case suggests pathogenicity). No antibiotic treatment is indicated for blood  culture contaminants. CRITICAL RESULT CALLED TO, READ BACK BY AND VERIFIED WITH: PHARMD MARY SWAYNE [id number] 1550 BY J RAZZAK, MT    Staphylococcus lugdunensis NOT DETECTED NOT DETECTED Final   Streptococcus species NOT DETECTED NOT DETECTED Final   Streptococcus agalactiae NOT DETECTED NOT DETECTED Final   Streptococcus pneumoniae NOT DETECTED NOT DETECTED Final   Streptococcus pyogenes NOT DETECTED NOT DETECTED Final   A.calcoaceticus-baumannii NOT DETECTED NOT DETECTED Final   Bacteroides fragilis NOT DETECTED NOT DETECTED Final   Enterobacterales NOT DETECTED NOT DETECTED Final   Enterobacter cloacae complex NOT DETECTED NOT DETECTED Final   Escherichia coli NOT DETECTED NOT DETECTED Final   Klebsiella aerogenes NOT DETECTED NOT DETECTED Final   Klebsiella oxytoca NOT DETECTED NOT DETECTED Final   Klebsiella pneumoniae NOT DETECTED NOT DETECTED Final   Proteus species NOT DETECTED NOT DETECTED Final   Salmonella species NOT DETECTED NOT DETECTED Final   Serratia marcescens NOT DETECTED NOT DETECTED Final   Haemophilus influenzae NOT DETECTED NOT DETECTED Final   Neisseria meningitidis NOT DETECTED NOT DETECTED Final   Pseudomonas aeruginosa NOT DETECTED NOT DETECTED Final   Stenotrophomonas maltophilia NOT DETECTED NOT DETECTED Final   Candida albicans NOT DETECTED NOT DETECTED Final   Candida auris NOT DETECTED NOT DETECTED Final   Candida glabrata NOT DETECTED NOT DETECTED Final   Candida krusei NOT DETECTED NOT DETECTED Final   Candida parapsilosis NOT DETECTED NOT DETECTED Final   Candida tropicalis NOT DETECTED NOT DETECTED Final   Cryptococcus neoformans/gattii NOT DETECTED NOT DETECTED Final   Methicillin resistance mecA/C DETECTED (A) NOT DETECTED Final    Comment: CRITICAL RESULT CALLED TO, READ BACK BY AND VERIFIED WITH: Drexel Gentles SWAYNE [id number] 1550 BY Chestine Costain,  MT Performed at [facility], [address] [place], [place] [address]   Culture, blood (Routine x 2)     Status: None (Preliminary result)   Collection Time: 06/21/23  7:03 AM   Specimen: BLOOD  Result Value Ref Range Status   Specimen Description   Final    BLOOD BLOOD RIGHT HAND Performed at [facility], [address] [place], [place] [address]    Special Requests   Final    BOTTLES DRAWN AEROBIC AND ANAEROBIC Blood Culture adequate volume Performed at [facility], [address] [place], [place] [address]    Culture   Final    NO GROWTH 2 DAYS  Performed at [facility], [address], [place], [place] [address]    Report Status PENDING  Incomplete  Culture, blood (Routine X 2) w Reflex to ID Panel     Status: None (Preliminary result)   Collection Time: 06/22/23  1:24 PM   Specimen: BLOOD RIGHT HAND  Result Value Ref Range Status   Specimen Description   Final    BLOOD RIGHT HAND Performed at [facility], [address], [place], [place] [address]    Special Requests   Final    BOTTLES DRAWN AEROBIC AND ANAEROBIC Blood Culture adequate volume Performed at [facility], [address], [place], [place] [address]    Culture   Final    NO GROWTH < 24 HOURS Performed at [facility], [address], [place], [place] [address]    Report Status PENDING  Incomplete  Culture, blood (Routine X 2) w Reflex to ID Panel     Status: None (Preliminary result)   Collection Time: 06/22/23  1:24 PM   Specimen: BLOOD RIGHT HAND  Result Value Ref Range Status   Specimen Description   Final    BLOOD RIGHT HAND Performed at [facility], [address], [place], [place] [address]    Special Requests   Final    BOTTLES DRAWN AEROBIC AND ANAEROBIC Blood Culture adequate volume Performed at [facility], [address] [place], [place] [address]    Culture   Final    NO  GROWTH < 24 HOURS Performed at [facility], [address] [place], [place] [address]    Report Status PENDING  Incomplete         Radiology Studies: No results found.       Scheduled Meds:  amLODipine   10 mg Oral Daily   Chlorhexidine  Gluconate Cloth  6 each Topical Daily   cloNIDine   0.3 mg Oral TID   enoxaparin  (LOVENOX ) injection  40 mg Subcutaneous Q24H   feeding supplement  1 Container Oral TID BM   HYDROmorphone    Intravenous Q4H   lactulose   20 g Oral BID   metoprolol  tartrate  100 mg Oral BID   oxyCODONE   20 mg Oral Q8H   pantoprazole   40 mg Oral BID   senna  2 tablet Oral QHS   sodium chloride  flush  3 mL Intravenous Q12H   sucralfate  1 g Oral TID WC & HS   Continuous Infusions:  lactated ringers  Stopped (06/20/23 2331)   promethazine  (PHENERGAN ) injection (IM or IVPB) Stopped (06/20/23 2228)     LOS: 2 days    Time spent: 55 minutes    Vada Garibaldi, MD [facility]

## 2023-06-23 NOTE — TOC Initial Note (Signed)
 Transition of Care [facility]) - Initial/Assessment Note    Patient Details  Name: April [name] MRN: [id number] Date of Birth: 01-05-1976  Transition of Care [facility]) CM/SW Contact:    Loreda Rodriguez, RN Phone Number:[phone]  06/23/2023, 3:39 PM  Clinical Narrative:                 TOC acknowledges patient with a high risk for readmission. CM at bedside introduced self and explained role. Patient states that she is from home alone where she normally functions independently. Patient states that she does have PCP ([facility], Adriane Albe, NP). Patient has no HH or DME needs. TOC will follow for any disposition needs.    Expected Discharge Plan: Home/Self Care Barriers to Discharge: Continued Medical Work up   Patient Goals and CMS Choice Patient states their goals for this hospitalization and ongoing recovery are:: Wants to feel better in order to return home CMS Medicare.gov Compare Post Acute Care list provided to::  (n/a) Choice offered to / list presented to : NA Pinch ownership interest in [facility].provided to::  (n/a)    Expected Discharge Plan and Services In-house Referral: NA Discharge Planning Services: CM Consult Post Acute Care Choice: NA Living arrangements for the past 2 months: Single Family Home                 DME Arranged: N/A DME Agency: NA       HH Arranged: NA HH Agency: NA        Prior Living Arrangements/Services Living arrangements for the past 2 months: Single Family Home Lives with:: Minor Children Patient language and need for interpreter reviewed:: Yes Do you feel safe going back to the place where you live?: Yes      Need for Family Participation in Patient Care: No (Comment) Care giver support system in place?: Yes (comment) Current home services:  (n/a) Criminal Activity/Legal Involvement Pertinent to Current Situation/Hospitalization: No - Comment as needed  Activities of Daily Living   ADL Screening (condition at  time of admission) Independently performs ADLs?: Yes (appropriate for developmental age) Is the patient deaf or have difficulty hearing?: No Does the patient have difficulty seeing, even when wearing glasses/contacts?: No Does the patient have difficulty concentrating, remembering, or making decisions?: No  Permission Sought/Granted Permission sought to share information with : Family Supports    Share Information with NAME: April [name]     Permission granted to share info w Relationship: mother  Permission granted to share info w Contact Information: [phone]  Emotional Assessment Appearance:: Appears stated age Attitude/Demeanor/Rapport: Gracious Affect (typically observed): Pleasant Orientation: : Oriented to Self, Oriented to Place, Oriented to  Time, Oriented to Situation Alcohol / Substance Use: Not Applicable Psych Involvement: No (comment)  Admission diagnosis:  Hypovolemia [E86.1] Generalized abdominal pain [R10.84] Chest pain, unspecified type [R07.9] Patient Active Problem List   Diagnosis Date Noted   DNR (do not resuscitate) discussion 06/22/2023   ACP (advance care planning) 06/22/2023   Hypovolemia 06/21/2023   Rectal adenocarcinoma (HCC) 06/21/2023   Nausea 06/21/2023   EKG, abnormal 06/21/2023   Counseling and coordination of care 06/21/2023   Malignant neoplasm metastatic to liver (HCC) 06/21/2023   Cancer associated pain 06/21/2023   Medication management 06/21/2023   Palliative care encounter 06/21/2023   Accelerated hypertension 06/08/2023   Cancer of ascending colon metastatic to intra-abdominal lymph node (HCC) 06/07/2023   Hypertensive urgency 03/24/2023   Abdominal pain, chronic, right upper quadrant 03/09/2023  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   Upper abdominal pain 02/24/2023   Hyperproteinemia 01/24/2023   Elevated alkaline phosphatase level 01/24/2023   History of biliary stent insertion 12/31/2022   Intrahepatic bile duct  dilation 12/31/2022   Imaging of gastrointestinal tract abnormal 12/31/2022   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   Enterocolitis 12/26/2022   Bacteremia due to Klebsiella pneumoniae 12/26/2022   Hyponatremia 12/24/2022   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   Ascending aortic aneurysm (HCC) 12/16/2022   Diverticulosis of colon without hemorrhage 11/23/2022   Abnormal CT of the abdomen 11/21/2022   Cancer related pain 11/19/2022   Acute nonintractable headache 08/11/2022   History of ERCP 06/28/2022   Former smoker 04/30/2022   Intractable nausea and vomiting 04/16/2022   Protein-calorie malnutrition, severe 03/26/2022   Chronic constipation 03/26/2022   Metastatic colon cancer to liver (HCC) 03/24/2022   Obstructive jaundice 03/24/2022   Biliary stricture 03/24/2022   Biliary obstruction 03/23/2022   Hyperbilirubinemia 03/21/2022   Abnormal LFTs 03/21/2022   Kidney stone 08/01/2021   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 [place] [address] Phone: [phone] Fax: [phone]     Social Drivers of Health (SDOH) Social History: SDOH Screenings   Food Insecurity: No Food Insecurity (06/21/2023)  Housing: Low Risk  (06/21/2023)  Transportation Needs: No Transportation Needs (06/21/2023)  Utilities: Not At Risk (06/21/2023)   Alcohol Screen: Low Risk  (11/11/2022)  Depression (PHQ2-9): Low Risk  (11/11/2022)  Financial Resource Strain: Low Risk  (11/11/2022)  Physical Activity: Inactive (11/11/2022)  Social Connections: Socially Isolated (01/24/2023)  Stress: No Stress Concern Present (11/11/2022)  Tobacco Use: Medium Risk (06/20/2023)  Health Literacy: Adequate Health Literacy (11/11/2022)   SDOH Interventions:     Readmission Risk Interventions    06/23/2023    3:12 PM  Readmission Risk Prevention Plan  Transportation Screening Complete  Medication Review (RN Care Manager) Complete  PCP or Specialist appointment within 3-5 days of discharge Complete  HRI or Home Care Consult Complete  SW Recovery Care/Counseling Consult Complete  Palliative Care Screening Not Applicable  Skilled Nursing Facility Not Applicable

## 2023-06-23 NOTE — Progress Notes (Signed)
 Daily Progress Note   Patient Name: April [name]       Date: 06/23/2023 DOB: 1975-09-06  Age: 48 y.o. MRN#: [id number] Attending Physician: Vada Garibaldi, MD Primary Care Physician: Annella Kief, NP Admit Date: 06/20/2023 Length of Stay: 2 days  Reason for Consultation/Follow-up: Establishing goals of care and Pain control  Subjective:   CC: Patient feeling pain more managed with PCA,however, she believes the long acting pain medication is working, however, patient states that she is having to "stay on top of her PCA".    Following up regarding complex medical decision making and symptom management.  Subjective:  Reviewed recent EMR documentation from hospitalist and oncologist. Pain medication history noted.  Pain and non pain symptom management as well as goals of care discussions undertaken with patient, see below.     Advance Care Planning Conversation: discussed with patient, chaplain note reviewed.   Pertinent diagnosis: Metastatic colon cancer  Full Code, Full Scope for now.     All questions answered at that time.  Noted palliative medicine team continue to follow with patient's medical journey.   Objective:   Vital Signs:  BP (!) 140/99 (BP Location: Right Arm)   Pulse 70   Temp 98.3 F (36.8 C) (Oral)   Resp 16   Ht 5\' 4"  (1.626 m)   Wt 58.2 kg   LMP 01/07/2006   SpO2 100%   BMI 22.02 kg/m   Physical Exam: General: NAD, laying in bed, chronically ill appearing Cardiovascular: RRR Respiratory: no increased work of breathing noted, not in respiratory distress Abdomen: distended Neuro: A&Ox4, following commands easily Psych: appropriately answers all questions   Assessment & Plan:   Assessment: Patient is a 48 year old female with a past medical history of metastatic rectal adenocarcinoma with disease to and liver and complicated by biliary obstruction status post biliary stenting currently on chemotherapy, hypertension, COPD, aortic  aneurysm, and prolonged QTc who was admitted on 06/20/2023 for management of worsening abdominal pain associated with nausea/vomiting and intolerance of oral intake.  Patient had last received chemotherapy on 06/19/2023.  Palliative medicine team consulted to assist with complex medical decision making and symptom management. Of note, patient has previously been seen by inpatient palliative medicine team.  Recommendations/Plan: # Complex medical decision making/goals of care:  - Discussed care with patient, she has been seen by Dr Scherrie Curt this am, she is being referred to [facility] for clinical trial/salvage therapy.  Noted importance of continuing conversations with care team moving forward.  - Appreciate chaplain assistance with ACP documents. Patient lives at home with adult children.    -  Code Status: Full Code      # Symptom management: Patient is receiving these palliative interventions for symptom management with an intent to improve quality of life.      - Pain, severe in setting of metastatic rectal adenocarcinoma PCA settings noted.    - Change OxyContin  20 mg  to Q 8 hours from every 12 hours during the day for long-acting opioid management.                               -Continue IV Dilaudid  PCA 0.2 mg daily 10-minute as needed bolus.  No continuous infusion dose.  Continuing PCA to allow for fine-tuning of opioid dosing for oral medications can take on discharge.                 - Nausea/vomiting                               -  Personally reviewed EKG on 6/7 which noted QTc prolonged at 501.  Prior EKG on 06/07/2023 noted QTc 469.  Discussed with hospitalist recommended to avoid QTc prolonging medications if able.   - EKG on 05/22/2023 showed QTc remains prolonged at 501.  Continue to monitor intermittently or as recommended by hospitalist.  Avoid QTc prolonging medications as able.                               -Continue as needed Ativan  0.5 mg every 6 hours as needed nausea/vomiting                   - Constipation Hospitalist has started medications for management.                               - Receiving lactulose  20 mg twice daily.  Continue to adjust based on response                               - Receiving senna 2 tabs daily, continue to adjust based on response  # Psychosocial Support:  - Mother, 3 children over the age of 3, sisters  # Discharge Planning: To Be Determined  Discussed with: Patient,    Thank you for allowing the palliative care team to participate in the care [facility].  Mod MDM Lujean Sake MD.  Palliative Care Provider PMT # [phone]  If patient remains symptomatic despite maximum doses, please call PMT at [phone] between 0700 and 1900. Outside of these hours, please call attending, as PMT does not have night coverage.

## 2023-06-23 NOTE — Plan of Care (Signed)

## 2023-06-24 DIAGNOSIS — E861 Hypovolemia: Secondary | ICD-10-CM | POA: Diagnosis not present

## 2023-06-24 DIAGNOSIS — R531 Weakness: Secondary | ICD-10-CM | POA: Diagnosis not present

## 2023-06-24 DIAGNOSIS — Z515 Encounter for palliative care: Secondary | ICD-10-CM | POA: Diagnosis not present

## 2023-06-24 DIAGNOSIS — Z7189 Other specified counseling: Secondary | ICD-10-CM | POA: Diagnosis not present

## 2023-06-24 MED ORDER — HYDROMORPHONE HCL 1 MG/ML IJ SOLN
1.0000 mg | INTRAMUSCULAR | Status: DC | PRN
  Administered 2023-06-24 – 2023-06-25 (×7): 1 mg via INTRAVENOUS
  Filled 2023-06-24 (×7): qty 1

## 2023-06-24 MED ORDER — ORAL CARE MOUTH RINSE: 15.0000 mL | OROMUCOSAL | Status: DC | PRN

## 2023-06-24 MED ORDER — HYDROMORPHONE HCL 2 MG PO TABS
2.0000 mg | ORAL_TABLET | ORAL | Status: DC | PRN
  Administered 2023-06-24 (×2): 2 mg via ORAL
  Administered 2023-06-25 – 2023-06-26 (×6): 4 mg via ORAL
  Filled 2023-06-24 (×4): qty 2
  Filled 2023-06-24: qty 1
  Filled 2023-06-24 (×3): qty 2
  Filled 2023-06-24: qty 1

## 2023-06-24 NOTE — Progress Notes (Signed)
 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  Her last chemotherapy was on 6/6.  Patient reported abdominal pain and rectal discomfort with last bowel movement 6 days prior to presentation.  In the ED vitals were stable.  Potassium was low at 2.9 with mild leukocytosis at 12.7.  CT angio of the chest without evidence of PE or consolidation. CT scan of the abdomen pelvis with contrast, evidence of metastatic disease, no evidence of acute infection or obstruction.  Patient was then at the hospital for further evaluation and treatment.      Assessment/Plan: Principal Problem:   Hypovolemia Active Problems:   Cancer related pain   Hypokalemia   Sepsis (HCC)   Rectal adenocarcinoma (HCC)   Nausea   EKG, abnormal   Counseling and coordination of care   Malignant neoplasm metastatic to liver [facility])   Cancer associated pain   Medication management   Palliative care encounter   DNR (do not resuscitate) discussion   ACP (advance care planning)  Intractable nausea, vomiting and abdominal pain due to chemotherapy. Severe intractable abdominal pain due to metastatic cancer Intra-abdominal infection suspected, ruled out. Blood cultures showed staph epidermidis likely to be contaminant.    Case was discussed with infectious disease by previous provider and antibiotics were discontinued.  Continue supportive care.  Tolerating soft diet.  Severe cancer-related pain, appreciate palliative care input.  Patient currently on Dilaudid  PCA.  Continue  Protonix  twice daily, sucralfate, dexamethasone . Followed by palliative and oncology.  Palliative care has seen the patient today and has been transition to Dilaudid  2 to 4 mg every 3 hours as needed with OxyContin  20 every 8 hrly.  Plan is to discontinue PCA pump today.   Hypokalemia and hypomagnesemia: Replenished.  No recent labs.  Will check in AM.   Essential hypertension.   On amlodipine  clonidine  and metoprolol .  Blood pressure is still elevated.  Could be secondary to pain as well   COPD: On as needed nebulizers  DVT prophylaxis: enoxaparin  (LOVENOX ) injection 40 mg Start: 06/21/23 1000   Disposition: Likely home in 1 to 2 days.  Status is: Inpatient Remains inpatient appropriate because: On Dilaudid  pump, pain control,    Code Status:     Code Status: Full Code  Family Communication: None at bedside  Consultants: Oncology Palliative care  Procedures: PCA pump  Anti-infectives:  None  Anti-infectives (From admission, onward)    Start     Dose/Rate Route Frequency Ordered Stop   06/21/23 2200  vancomycin  (VANCOCIN ) IVPB 1000 mg/200 mL premix  Status:  Discontinued        1,000 mg 200 mL/hr over 60 Minutes Intravenous Every 24 hours 06/21/23 1608 06/22/23 1120   06/21/23 1000  metroNIDAZOLE  (FLAGYL ) IVPB 500 mg  Status:  Discontinued        500 mg 100 mL/hr over 60 Minutes Intravenous Every 12 hours 06/21/23 0417 06/22/23 1349   06/21/23 0600  cefTRIAXone  (ROCEPHIN ) 2 g in sodium chloride  0.9 % 100 mL IVPB  Status:  Discontinued        2 g 200 mL/hr over 30 Minutes Intravenous Every 24  hours 06/21/23 0417 06/22/23 1349   06/20/23 2245  ceFEPIme  (MAXIPIME ) 2 g in sodium chloride  0.9 % 100 mL IVPB        2 g 200 mL/hr over 30 Minutes Intravenous  Once 06/20/23 2240 06/21/23 0019   06/20/23 2245  metroNIDAZOLE  (FLAGYL ) IVPB 500 mg        500 mg 100 mL/hr over 60 Minutes Intravenous  Once 06/20/23 2240 06/21/23 0021   06/20/23 2245  vancomycin  (VANCOCIN ) IVPB  1000 mg/200 mL premix        1,000 mg 200 mL/hr over 60 Minutes Intravenous  Once 06/20/23 2240 06/21/23 0156        Subjective: Today, patient was seen and examined at bedside.  Patient complains of mild pain but is reasonable on the current regimen.  Has been having loose stools.  Objective: Vitals:   06/24/23 1018 06/24/23 1156  BP: (!) 119/93   Pulse: 70   Resp: 17 16  Temp: 98.6 F (37 C)   SpO2: 97%     Intake/Output Summary (Last 24 hours) at 06/24/2023 1323 Last data filed at 06/24/2023 0900 Gross per 24 hour  Intake 1148.04 ml  Output 1200 ml  Net -51.96 ml   Filed Weights   06/22/23 0500 06/23/23 0558 06/24/23 0500  Weight: 57.2 kg 58.2 kg 57.5 kg   Body mass index is 21.75 kg/m.   Physical Exam:  GENERAL: Patient is alert awake and oriented. Not in obvious distress. HENT: No scleral pallor or icterus. Pupils equally reactive to light. Oral mucosa is moist NECK: is supple, no gross swelling noted. CHEST: Clear to auscultation. No crackles or wheezes.  Diminished breath sounds bilaterally. CVS: S1 and S2 heard, no murmur. Regular rate and rhythm.  ABDOMEN: Soft, non-tender, bowel sounds are present.  EXTREMITIES: No edema.  Left upper extremity PICC line in place. CNS: Cranial nerves are intact. No focal motor deficits. SKIN: warm and dry without rashes.  Data Review: I have personally reviewed the following laboratory data and studies,  CBC: Recent Labs  Lab 06/19/23 0857 06/20/23 2053 06/21/23 0539  WBC 10.8* 12.7* 9.9  NEUTROABS 6.5 11.1*  --   HGB 10.8* 11.5* 10.3*  HCT 33.0* 35.2* 31.6*  MCV 87.8 86.7 88.3  PLT 387 396 311   Basic Metabolic Panel: Recent Labs  Lab 06/19/23 0857 06/20/23 2053 06/20/23 2139 06/21/23 0539 06/22/23 0415  NA 135 136  --  133*  --   K 3.3* 2.9*  --  3.4*  --   CL 95* 92*  --  95*  --   CO2 29 30  --  29  --   GLUCOSE 101* 87  --  83  --   BUN 6 8  --  7  --   CREATININE 0.41* 0.43*  --  0.37* 0.57   CALCIUM  9.7 8.9  --  8.5*  --   MG 1.9  --  1.8  --   --   PHOS  --   --   --  3.0  --    Liver Function Tests: Recent Labs  Lab 06/19/23 0857 06/20/23 2053 06/21/23 0539  AST 40 32 25  ALT 18 18 16   ALKPHOS 772* 715* 606*  BILITOT 0.3 0.8 0.7  PROT 7.8 7.9 6.9  ALBUMIN 3.3* 2.9* 2.6*   Recent Labs  Lab 06/21/23 0539  LIPASE 44   No results for input(s): AMMONIA in the last 168 hours. Cardiac Enzymes: No results for input(s): CKTOTAL,  CKMB, CKMBINDEX, TROPONINI in the last 168 hours. BNP (last 3 results) No results for input(s): BNP in the last 8760 hours.  ProBNP (last 3 results) No results for input(s): PROBNP in the last 8760 hours.  CBG: No results for input(s): GLUCAP in the last 168 hours. Recent Results (from the past 240 hours)  Culture, blood (Routine x 2)     Status: Abnormal   Collection Time: 06/20/23  9:16 PM   Specimen: BLOOD  Result Value Ref Range Status   Specimen Description   Final    BLOOD SITE NOT SPECIFIED Performed at [facility], [address], [place], [place] [address]    Special Requests   Final    BOTTLES DRAWN AEROBIC AND ANAEROBIC Blood Culture adequate volume Performed at [facility], [address], [place], [place] [address]    Culture  Setup Time   Final    GRAM POSITIVE COCCI IN CLUSTERS IN BOTH AEROBIC AND ANAEROBIC BOTTLES CRITICAL RESULT CALLED TO, READ BACK BY AND VERIFIED WITH: PHARMD MARY SWAYNE [id number] 1550 BY J RAZZAK, MT    Culture (A)  Final    STAPHYLOCOCCUS EPIDERMIDIS STAPHYLOCOCCUS SAPROPHYTICUS STAPHYLOCOCCUS HOMINIS THE SIGNIFICANCE OF ISOLATING THIS ORGANISM FROM A SINGLE SET OF BLOOD CULTURES WHEN MULTIPLE SETS ARE DRAWN IS UNCERTAIN. PLEASE NOTIFY THE MICROBIOLOGY DEPARTMENT WITHIN ONE WEEK IF SPECIATION AND SENSITIVITIES ARE REQUIRED. Performed at [facility], [address] [place], [place] [address]    Report Status 06/22/2023 FINAL   Final  Blood Culture ID Panel (Reflexed)     Status: Abnormal   Collection Time: 06/20/23  9:16 PM  Result Value Ref Range Status   Enterococcus faecalis NOT DETECTED NOT DETECTED Final   Enterococcus Faecium NOT DETECTED NOT DETECTED Final   Listeria monocytogenes NOT DETECTED NOT DETECTED Final   Staphylococcus species DETECTED (A) NOT DETECTED Final    Comment: CRITICAL RESULT CALLED TO, READ BACK BY AND VERIFIED WITH: PHARMD MARY SWAYNE [id number] 1550 BY J RAZZAK, MT    Staphylococcus aureus (BCID) NOT DETECTED NOT DETECTED Final   Staphylococcus epidermidis DETECTED (A) NOT DETECTED Final    Comment: Methicillin (oxacillin) resistant coagulase negative staphylococcus. Possible blood culture contaminant (unless isolated from more than one blood culture draw or clinical case suggests pathogenicity). No antibiotic treatment is indicated for blood  culture contaminants. CRITICAL RESULT CALLED TO, READ BACK BY AND VERIFIED WITH: PHARMD MARY SWAYNE [id number] 1550 BY J RAZZAK, MT    Staphylococcus lugdunensis NOT DETECTED NOT DETECTED Final   Streptococcus species NOT DETECTED NOT DETECTED Final   Streptococcus agalactiae NOT DETECTED NOT DETECTED Final   Streptococcus pneumoniae NOT DETECTED NOT DETECTED Final   Streptococcus pyogenes NOT DETECTED NOT DETECTED Final   A.calcoaceticus-baumannii NOT DETECTED NOT DETECTED Final   Bacteroides fragilis NOT DETECTED NOT DETECTED Final   Enterobacterales NOT DETECTED NOT DETECTED Final   Enterobacter cloacae complex NOT DETECTED NOT DETECTED Final   Escherichia coli NOT DETECTED NOT DETECTED Final   Klebsiella aerogenes NOT DETECTED NOT DETECTED Final   Klebsiella oxytoca NOT DETECTED NOT DETECTED Final   Klebsiella pneumoniae NOT DETECTED NOT DETECTED Final   Proteus species NOT DETECTED NOT DETECTED Final   Salmonella species NOT DETECTED NOT DETECTED Final   Serratia marcescens NOT DETECTED NOT DETECTED Final   Haemophilus influenzae NOT  DETECTED NOT DETECTED Final   Neisseria meningitidis NOT DETECTED NOT DETECTED Final   Pseudomonas aeruginosa NOT DETECTED NOT DETECTED Final   Stenotrophomonas maltophilia  NOT DETECTED NOT DETECTED Final   Candida albicans NOT DETECTED NOT DETECTED Final   Candida auris NOT DETECTED NOT DETECTED Final   Candida glabrata NOT DETECTED NOT DETECTED Final   Candida krusei NOT DETECTED NOT DETECTED Final   Candida parapsilosis NOT DETECTED NOT DETECTED Final   Candida tropicalis NOT DETECTED NOT DETECTED Final   Cryptococcus neoformans/gattii NOT DETECTED NOT DETECTED Final   Methicillin resistance mecA/C DETECTED (A) NOT DETECTED Final    Comment: CRITICAL RESULT CALLED TO, READ BACK BY AND VERIFIED WITH: Drexel Gentles SWAYNE [id number] 1550 BY Chestine Costain, MT Performed at [facility], [address], [place], [place] [address]   Culture, blood (Routine x 2)     Status: None (Preliminary result)   Collection Time: 06/21/23  7:03 AM   Specimen: BLOOD  Result Value Ref Range Status   Specimen Description   Final    BLOOD BLOOD RIGHT HAND Performed at [facility], [address] [place], [place] [address]    Special Requests   Final    BOTTLES DRAWN AEROBIC AND ANAEROBIC Blood Culture adequate volume Performed at [facility], [address], [place], [place] [address]    Culture   Final    NO GROWTH 3 DAYS Performed at [facility], [address], [place], [place] [address]    Report Status PENDING  Incomplete  Culture, blood (Routine X 2) w Reflex to ID Panel     Status: None (Preliminary result)   Collection Time: 06/22/23  1:24 PM   Specimen: BLOOD RIGHT HAND  Result Value Ref Range Status   Specimen Description   Final    BLOOD RIGHT HAND Performed at [facility], [address], [place], [place] [address]    Special Requests   Final    BOTTLES DRAWN AEROBIC AND ANAEROBIC Blood Culture adequate volume Performed at [facility], [address], [place], [place] [address]    Culture  Setup Time   Final    GRAM POSITIVE COCCI IN CLUSTERS ANAEROBIC BOTTLE ONLY CRITICAL VALUE NOTED.  VALUE IS CONSISTENT WITH PREVIOUSLY REPORTED AND CALLED VALUE.    Culture   Final    GRAM POSITIVE COCCI IN CLUSTERS CULTURE REINCUBATED FOR BETTER GROWTH Performed at [facility], [address], [place], [place] [address]    Report Status PENDING  Incomplete  Culture, blood (Routine X 2) w Reflex to ID Panel     Status: None (Preliminary result)   Collection Time: 06/22/23  1:24 PM   Specimen: BLOOD RIGHT HAND  Result Value Ref Range Status   Specimen Description   Final    BLOOD RIGHT HAND Performed at [facility], [address], [place], [place] [address]    Special Requests   Final    BOTTLES DRAWN AEROBIC AND ANAEROBIC Blood Culture adequate volume Performed at [facility], [address], [place], [place] [address]    Culture  Setup Time   Final    GRAM POSITIVE COCCI IN CLUSTERS ANAEROBIC BOTTLE ONLY CRITICAL VALUE NOTED.  VALUE IS CONSISTENT WITH PREVIOUSLY REPORTED AND CALLED VALUE.    Culture   Final    GRAM POSITIVE COCCI IN CLUSTERS CULTURE REINCUBATED FOR BETTER GROWTH Performed at [facility], [address] [place], [place] [address]    Report Status PENDING  Incomplete     Studies: No results found.  Rosena Conradi, MD  [facility] 06/24/2023  If 7PM-7AM, please contact night-coverage

## 2023-06-24 NOTE — Hospital Course (Signed)
 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  Her last chemotherapy was on 6/6.  Patient reported abdominal pain and rectal discomfort with last bowel movement 6 days prior to presentation.  In the ED vitals were stable.  Potassium was low at 2.9 with mild leukocytosis at 12.7.  CT angio of the chest without evidence of PE or consolidation. CT scan of the abdomen pelvis with contrast, evidence of metastatic disease, no evidence of acute infection or obstruction.  Patient was then at the hospital for further evaluation and treatment.   Assessment & Plan:  Intractable nausea vomiting and abdominal pain due to chemotherapy. Severe intractable abdominal pain due to metastatic cancer Intra-abdominal infection suspected, ruled out. Blood cultures showed staph epidermidis likely to be contaminant.    Case was discussed with infectious disease by previous provider and antibiotics were discontinued.  Continue supportive care.  Tolerating soft diet.  Severe cancer-related pain, appreciate palliative care input.  Patient currently on Dilaudid  PCA.  Continue Protonix  twice daily sucralfate dexamethasone . Followed by palliative and oncology, further management as oncology.   Hypokalemia and hypomagnesemia: Replenished.  No recent labs.  Will check in AM.   Essential hypertension.   On amlodipine  clonidine  and metoprolol .  Still elevated.  Could be secondary to pain as well   COPD: On as needed nebulizers

## 2023-06-24 NOTE — Progress Notes (Signed)
 Daily Progress Note   Patient Name: April [name]       Date: 06/24/2023 DOB: [birth year]  Age: 48 y.o. MRN#: [id number] Attending Physician: Rosena Conradi, MD Primary Care Physician: Annella Kief, NP Admit Date: 06/20/2023 Length of Stay: 3 days  Reason for Consultation/Follow-up: Establishing goals of care and Pain control  Subjective:   CC: Patient that pain control has been reasonable.  We discussed about current opioid regimen and patient believes that she would be able to attempt weaning off of the PCA today.   Following up regarding complex medical decision making and symptom management.  Subjective:  Reviewed recent EMR documentation from hospitalist and oncologist. Pain medication history noted.  We discussed about discontinuing PCA we discussed about adding as needed oral hydromorphone , patient also to have IV Dilaudid  provision as a rescue and for for breakthrough.  OxyContin  every 8 hours and patient believes this has made a difference. Pain and non pain symptom management as well as goals of care discussions undertaken with patient, see below.     Advance Care Planning Conversation: discussed with patient, chaplain note reviewed.   Pertinent diagnosis: Metastatic colon cancer  Full Code, Full Scope for now.     All questions answered at that time.  Noted palliative medicine team continue to follow with patient's medical journey.   Objective:   Vital Signs:  BP (!) 119/93 (BP Location: Right Arm)   Pulse 70   Temp 98.6 F (37 C) (Oral)   Resp 16   Ht 5' 4 (1.626 m)   Wt 57.5 kg   LMP 01/07/2006   SpO2 97%   BMI 21.75 kg/m   Physical Exam: General: NAD, laying in bed, chronically ill appearing Cardiovascular: RRR Respiratory: no increased work of breathing noted, not in respiratory distress Abdomen: distended Neuro: A&Ox4, following commands easily Psych: appropriately answers all questions   Assessment & Plan:   Assessment: Patient  is a 48 year old female with a past medical history of metastatic rectal adenocarcinoma with disease to and liver and complicated by biliary obstruction status post biliary stenting currently on chemotherapy, hypertension, COPD, aortic aneurysm, and prolonged QTc who was admitted on 06/20/2023 for management of worsening abdominal pain associated with nausea/vomiting and intolerance of oral intake.  Patient had last received chemotherapy on 06/19/2023.  Palliative medicine team consulted to assist with complex medical decision making and symptom management. Of note, patient has previously been seen by inpatient palliative medicine team.  Recommendations/Plan: # Complex medical decision making/goals of care:  - Discussed care with patient, she has been seen by Dr Scherrie Curt this am, she is being referred to [facility] for clinical trial/salvage therapy.  Noted importance of continuing conversations with care team moving forward.  - Appreciate chaplain assistance with ACP documents. Patient lives at home with adult children.    -  Code Status: Full Code      # Symptom management: Patient is receiving these palliative interventions for symptom management with an intent to improve quality of life.      - Pain, severe in setting of metastatic rectal adenocarcinoma PCA settings noted.    - Change OxyContin  20 mg  to Q 8 hours from every 12 hours during the day for long-acting opioid management.                               Discontinue PCA, start oral hydromorphone  2-4 mg every 3 hours on an as-needed  basis for pain, patient to also have IV Dilaudid  every 3 hours on an as-needed basis only for severe rescue breakthrough pain discussed extensively with patient about new opioid regimen and also updated/discussed with bedside RN colleague.                 - Nausea/vomiting                               - Personally reviewed EKG on 6/7 which noted QTc prolonged at 501.  Prior EKG on 06/07/2023 noted QTc 469.  Discussed  with hospitalist recommended to avoid QTc prolonging medications if able.   - EKG on 05/22/2023 showed QTc remains prolonged at 501.  Continue to monitor intermittently or as recommended by hospitalist.  Avoid QTc prolonging medications as able.                               -Continue as needed Ativan  0.5 mg every 6 hours as needed nausea/vomiting                  - Constipation Hospitalist has started medications for management.                               - Receiving lactulose  20 mg twice daily.  Continue to adjust based on response                               - Receiving senna 2 tabs daily, continue to adjust based on response  # Psychosocial Support:  - Mother, 3 children over the age of 33, sisters  # Discharge Planning: To Be Determined  Discussed with: Patient,    Thank you for allowing the palliative care team to participate in the care [facility].  Mod MDM Lujean Sake MD.  Palliative Care Provider PMT # [phone]  If patient remains symptomatic despite maximum doses, please call PMT at [phone] between 0700 and 1900. Outside of these hours, please call attending, as PMT does not have night coverage.

## 2023-06-24 NOTE — Plan of Care (Signed)
  Problem: Education: Goal: Knowledge of General Education information will improve Description: Including pain rating scale, medication(s)/side effects and non-pharmacologic comfort measures Outcome: Progressing   Problem: Clinical Measurements: Goal: Will remain free from infection Outcome: Progressing Goal: Cardiovascular complication will be avoided Outcome: Progressing   Problem: Activity: Goal: Risk for activity intolerance will decrease Outcome: Progressing   Problem: Coping: Goal: Level of anxiety will decrease Outcome: Progressing   Problem: Safety: Goal: Ability to remain free from injury will improve Outcome: Progressing

## 2023-06-25 DIAGNOSIS — E861 Hypovolemia: Secondary | ICD-10-CM | POA: Diagnosis not present

## 2023-06-25 DIAGNOSIS — G893 Neoplasm related pain (acute) (chronic): Secondary | ICD-10-CM | POA: Diagnosis not present

## 2023-06-25 LAB — CULTURE, BLOOD (ROUTINE X 2)
Culture: NO GROWTH
Special Requests: ADEQUATE
Special Requests: ADEQUATE
Special Requests: ADEQUATE

## 2023-06-25 NOTE — Plan of Care (Signed)
   Problem: Clinical Measurements: Goal: Will remain free from infection Outcome: Progressing Goal: Diagnostic test results will improve Outcome: Progressing Goal: Respiratory complications will improve Outcome: Progressing   Problem: Activity: Goal: Risk for activity intolerance will decrease Outcome: Progressing   Problem: Nutrition: Goal: Adequate nutrition will be maintained Outcome: Progressing   Problem: Coping: Goal: Level of anxiety will decrease Outcome: Progressing

## 2023-06-25 NOTE — Progress Notes (Signed)
 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  Her last chemotherapy was on 6/6.  Patient reported abdominal pain and rectal discomfort with last bowel movement 6 days prior to presentation.  In the ED, vitals were stable.  Potassium was low at 2.9 with mild leukocytosis at 12.7.  CT angio of the chest without evidence of PE or consolidation. CT scan of the abdomen pelvis with contrast, evidence of metastatic disease, no evidence of acute infection or obstruction.  Patient was then at the hospital for further evaluation and treatment.      Assessment/Plan: Principal Problem:   Hypovolemia Active Problems:   Cancer related pain   Hypokalemia   Sepsis (HCC)   Rectal adenocarcinoma (HCC)   Nausea   EKG, abnormal   Counseling and coordination of care   Malignant neoplasm metastatic to liver [facility])   Cancer associated pain   Medication management   Palliative care encounter   DNR (do not resuscitate) discussion   ACP (advance care planning)  Intractable nausea, vomiting and abdominal pain due to chemotherapy. Severe intractable abdominal pain due to metastatic cancer Intra-abdominal infection suspected, ruled out. Blood cultures showed staph epidermidis likely to be contaminant.    Case was discussed with infectious disease by previous provider and antibiotics were discontinued.  Continue supportive care.  Tolerating soft diet.  Severe cancer-related pain, appreciate palliative care input. Continue Protonix  twice daily, sucralfate,  dexamethasone . Followed by palliative and oncology.  Palliative care has seen and patient has been transitioned to Dilaudid  2 to 4 mg every 3 hours as needed with OxyContin  20 every 8 hrly off PCA pump.  Patient stated that she had severe pain in the morning and needed IV narcotic.  I have advised her to use p.o. medications to see how she does.   Hypokalemia and hypomagnesemia: Replenished.  No recent labs.  Will check in AM.   Essential hypertension.   On amlodipine  clonidine  and metoprolol .  Blood pressure better at this time on the lower side   COPD: On as needed nebulizers  DVT prophylaxis: enoxaparin  (LOVENOX ) injection 40 mg Start: 06/21/23 1000   Disposition: Likely home in 1 to 2 days.  Status is: Inpatient Remains inpatient appropriate because: Pending adequate pain control.    Code Status:     Code Status: Full Code  Family Communication: None at bedside  Consultants: Oncology Palliative care  Procedures: PCA pump discontinued  Anti-infectives:  None  Anti-infectives (From admission, onward)    Start     Dose/Rate Route Frequency Ordered Stop   06/21/23 2200  vancomycin  (VANCOCIN ) IVPB 1000 mg/200 mL premix  Status:  Discontinued        1,000 mg 200 mL/hr over 60 Minutes Intravenous Every 24 hours 06/21/23 1608 06/22/23 1120   06/21/23 1000  metroNIDAZOLE  (FLAGYL ) IVPB 500 mg  Status:  Discontinued        500 mg 100 mL/hr over 60 Minutes Intravenous Every 12 hours 06/21/23 0417 06/22/23 1349   06/21/23 0600  cefTRIAXone  (ROCEPHIN ) 2 g in sodium chloride  0.9 % 100 mL IVPB  Status:  Discontinued  2 g 200 mL/hr over 30 Minutes Intravenous Every 24 hours 06/21/23 0417 06/22/23 1349   06/20/23 2245  ceFEPIme  (MAXIPIME ) 2 g in sodium chloride  0.9 % 100 mL IVPB        2 g 200 mL/hr over 30 Minutes Intravenous  Once 06/20/23 2240 06/21/23 0019   06/20/23 2245  metroNIDAZOLE  (FLAGYL ) IVPB 500 mg        500 mg 100 mL/hr over 60 Minutes Intravenous  Once  06/20/23 2240 06/21/23 0021   06/20/23 2245  vancomycin  (VANCOCIN ) IVPB 1000 mg/200 mL premix        1,000 mg 200 mL/hr over 60 Minutes Intravenous  Once 06/20/23 2240 06/21/23 0156        Subjective: Today, patient was seen and examined at bedside.  Patient stated that she had severe pain in the morning and required IV Dilaudid .  Still feels insecure about her pain control.  Encouraged her to use short acting oral Dilaudid  while working with long-acting OxyContin .    Objective: Vitals:   06/24/23 2349 06/25/23 0545  BP: 109/82 97/79  Pulse: 87 88  Resp:  18  Temp:  97.7 F (36.5 C)  SpO2:  99%    Intake/Output Summary (Last 24 hours) at 06/25/2023 1310 Last data filed at 06/25/2023 1000 Gross per 24 hour  Intake 368 ml  Output --  Net 368 ml   Filed Weights   06/22/23 0500 06/23/23 0558 06/24/23 0500  Weight: 57.2 kg 58.2 kg 57.5 kg   Body mass index is 21.75 kg/m.   Physical Exam:  GENERAL: Patient is alert awake and oriented. Not in obvious distress. HENT: No scleral pallor or icterus. Pupils equally reactive to light. Oral mucosa is moist NECK: is supple, no gross swelling noted. CHEST: Clear to auscultation. No crackles or wheezes.  Diminished breath sounds bilaterally. CVS: S1 and S2 heard, no murmur. Regular rate and rhythm.  ABDOMEN: Soft, non-tender, bowel sounds are present.  EXTREMITIES: No edema.  Left upper extremity PICC line in place. CNS: Cranial nerves are intact. No focal motor deficits. SKIN: warm and dry without rashes.  Data Review: I have personally reviewed the following laboratory data and studies,  CBC: Recent Labs  Lab 06/19/23 0857 06/20/23 2053 06/21/23 0539  WBC 10.8* 12.7* 9.9  NEUTROABS 6.5 11.1*  --   HGB 10.8* 11.5* 10.3*  HCT 33.0* 35.2* 31.6*  MCV 87.8 86.7 88.3  PLT 387 396 311   Basic Metabolic Panel: Recent Labs  Lab 06/19/23 0857 06/20/23 2053 06/20/23 2139 06/21/23 0539 06/22/23 0415  NA 135 136  --  133*   --   K 3.3* 2.9*  --  3.4*  --   CL 95* 92*  --  95*  --   CO2 29 30  --  29  --   GLUCOSE 101* 87  --  83  --   BUN 6 8  --  7  --   CREATININE 0.41* 0.43*  --  0.37* 0.57  CALCIUM  9.7 8.9  --  8.5*  --   MG 1.9  --  1.8  --   --   PHOS  --   --   --  3.0  --    Liver Function Tests: Recent Labs  Lab 06/19/23 0857 06/20/23 2053 06/21/23 0539  AST 40 32 25  ALT 18 18 16   ALKPHOS 772* 715* 606*  BILITOT 0.3 0.8 0.7  PROT 7.8 7.9 6.9  ALBUMIN 3.3* 2.9* 2.6*  Recent Labs  Lab 06/21/23 0539  LIPASE 44   No results for input(s): AMMONIA in the last 168 hours. Cardiac Enzymes: No results for input(s): CKTOTAL, CKMB, CKMBINDEX, TROPONINI in the last 168 hours. BNP (last 3 results) No results for input(s): BNP in the last 8760 hours.  ProBNP (last 3 results) No results for input(s): PROBNP in the last 8760 hours.  CBG: No results for input(s): GLUCAP in the last 168 hours. Recent Results (from the past 240 hours)  Culture, blood (Routine x 2)     Status: Abnormal   Collection Time: 06/20/23  9:16 PM   Specimen: BLOOD  Result Value Ref Range Status   Specimen Description   Final    BLOOD SITE NOT SPECIFIED Performed at [facility], [address], [place], [place] [address]    Special Requests   Final    BOTTLES DRAWN AEROBIC AND ANAEROBIC Blood Culture adequate volume Performed at [facility], [address] [place], [place] [address]    Culture  Setup Time   Final    GRAM POSITIVE COCCI IN CLUSTERS IN BOTH AEROBIC AND ANAEROBIC BOTTLES CRITICAL RESULT CALLED TO, READ BACK BY AND VERIFIED WITH: PHARMD MARY SWAYNE [id number] 1550 BY J RAZZAK, MT    Culture (A)  Final    STAPHYLOCOCCUS EPIDERMIDIS STAPHYLOCOCCUS SAPROPHYTICUS STAPHYLOCOCCUS HOMINIS THE SIGNIFICANCE OF ISOLATING THIS ORGANISM FROM A SINGLE SET OF BLOOD CULTURES WHEN MULTIPLE SETS ARE DRAWN IS UNCERTAIN. PLEASE NOTIFY THE MICROBIOLOGY DEPARTMENT  WITHIN ONE WEEK IF SPECIATION AND SENSITIVITIES ARE REQUIRED. Performed at [facility], [address], [place], [place] [address]    Report Status 06/22/2023 FINAL  Final  Blood Culture ID Panel (Reflexed)     Status: Abnormal   Collection Time: 06/20/23  9:16 PM  Result Value Ref Range Status   Enterococcus faecalis NOT DETECTED NOT DETECTED Final   Enterococcus Faecium NOT DETECTED NOT DETECTED Final   Listeria monocytogenes NOT DETECTED NOT DETECTED Final   Staphylococcus species DETECTED (A) NOT DETECTED Final    Comment: CRITICAL RESULT CALLED TO, READ BACK BY AND VERIFIED WITH: PHARMD MARY SWAYNE [id number] 1550 BY J RAZZAK, MT    Staphylococcus aureus (BCID) NOT DETECTED NOT DETECTED Final   Staphylococcus epidermidis DETECTED (A) NOT DETECTED Final    Comment: Methicillin (oxacillin) resistant coagulase negative staphylococcus. Possible blood culture contaminant (unless isolated from more than one blood culture draw or clinical case suggests pathogenicity). No antibiotic treatment is indicated for blood  culture contaminants. CRITICAL RESULT CALLED TO, READ BACK BY AND VERIFIED WITH: PHARMD MARY SWAYNE [id number] 1550 BY J RAZZAK, MT    Staphylococcus lugdunensis NOT DETECTED NOT DETECTED Final   Streptococcus species NOT DETECTED NOT DETECTED Final   Streptococcus agalactiae NOT DETECTED NOT DETECTED Final   Streptococcus pneumoniae NOT DETECTED NOT DETECTED Final   Streptococcus pyogenes NOT DETECTED NOT DETECTED Final   A.calcoaceticus-baumannii NOT DETECTED NOT DETECTED Final   Bacteroides fragilis NOT DETECTED NOT DETECTED Final   Enterobacterales NOT DETECTED NOT DETECTED Final   Enterobacter cloacae complex NOT DETECTED NOT DETECTED Final   Escherichia coli NOT DETECTED NOT DETECTED Final   Klebsiella aerogenes NOT DETECTED NOT DETECTED Final   Klebsiella oxytoca NOT DETECTED NOT DETECTED Final   Klebsiella pneumoniae NOT DETECTED NOT DETECTED Final   Proteus  species NOT DETECTED NOT DETECTED Final   Salmonella species NOT DETECTED NOT DETECTED Final   Serratia marcescens NOT DETECTED NOT DETECTED Final   Haemophilus  influenzae NOT DETECTED NOT DETECTED Final   Neisseria meningitidis NOT DETECTED NOT DETECTED Final   Pseudomonas aeruginosa NOT DETECTED NOT DETECTED Final   Stenotrophomonas maltophilia NOT DETECTED NOT DETECTED Final   Candida albicans NOT DETECTED NOT DETECTED Final   Candida auris NOT DETECTED NOT DETECTED Final   Candida glabrata NOT DETECTED NOT DETECTED Final   Candida krusei NOT DETECTED NOT DETECTED Final   Candida parapsilosis NOT DETECTED NOT DETECTED Final   Candida tropicalis NOT DETECTED NOT DETECTED Final   Cryptococcus neoformans/gattii NOT DETECTED NOT DETECTED Final   Methicillin resistance mecA/C DETECTED (A) NOT DETECTED Final    Comment: CRITICAL RESULT CALLED TO, READ BACK BY AND VERIFIED WITH: Drexel Gentles SWAYNE [id number] 1550 BY Chestine Costain, MT Performed at [facility], [address], [place], [place] [address]   Culture, blood (Routine x 2)     Status: None (Preliminary result)   Collection Time: 06/21/23  7:03 AM   Specimen: BLOOD  Result Value Ref Range Status   Specimen Description   Final    BLOOD BLOOD RIGHT HAND Performed at [facility], [address], [place], [place] [address]    Special Requests   Final    BOTTLES DRAWN AEROBIC AND ANAEROBIC Blood Culture adequate volume Performed at [facility], [address] [place], [place] [address]    Culture   Final    NO GROWTH 4 DAYS Performed at [facility], [address] [place], [place] [address]    Report Status PENDING  Incomplete  Culture, blood (Routine X 2) w Reflex to ID Panel     Status: Abnormal (Preliminary result)   Collection Time: 06/22/23  1:24 PM   Specimen: BLOOD RIGHT HAND  Result Value Ref Range Status   Specimen Description   Final    BLOOD RIGHT HAND Performed at  [facility], [address] [place], [place] [address]    Special Requests   Final    BOTTLES DRAWN AEROBIC AND ANAEROBIC Blood Culture adequate volume Performed at [facility], [address], [place], [place] [address]    Culture  Setup Time   Final    GRAM POSITIVE COCCI IN CLUSTERS ANAEROBIC BOTTLE ONLY CRITICAL VALUE NOTED.  VALUE IS CONSISTENT WITH PREVIOUSLY REPORTED AND CALLED VALUE.    Culture (A)  Final    STAPHYLOCOCCUS HOMINIS SUSCEPTIBILITIES TO FOLLOW Performed at [facility], [address], [place], [place] [address]    Report Status PENDING  Incomplete  Culture, blood (Routine X 2) w Reflex to ID Panel     Status: Abnormal (Preliminary result)   Collection Time: 06/22/23  1:24 PM   Specimen: BLOOD RIGHT HAND  Result Value Ref Range Status   Specimen Description   Final    BLOOD RIGHT HAND Performed at [facility], [address], [place], [place] [address]    Special Requests   Final    BOTTLES DRAWN AEROBIC AND ANAEROBIC Blood Culture adequate volume Performed at [facility], [address], [place], [place] [address]    Culture  Setup Time   Final    GRAM POSITIVE COCCI IN CLUSTERS ANAEROBIC BOTTLE ONLY CRITICAL VALUE NOTED.  VALUE IS CONSISTENT WITH PREVIOUSLY REPORTED AND CALLED VALUE. Performed at [facility], [address] [place], [place] [address]    Culture STAPHYLOCOCCUS HOMINIS (A)  Final   Report Status PENDING  Incomplete  Studies: No results found.    Rosena Conradi, MD  [facility] 06/25/2023  If 7PM-7AM, please contact night-coverage

## 2023-06-25 NOTE — Plan of Care (Signed)

## 2023-06-26 ENCOUNTER — Other Ambulatory Visit: Payer: Self-pay

## 2023-06-26 ENCOUNTER — Other Ambulatory Visit (HOSPITAL_BASED_OUTPATIENT_CLINIC_OR_DEPARTMENT_OTHER): Payer: Self-pay

## 2023-06-26 ENCOUNTER — Encounter: Payer: Self-pay | Admitting: Oncology

## 2023-06-26 DIAGNOSIS — E861 Hypovolemia: Secondary | ICD-10-CM | POA: Diagnosis not present

## 2023-06-26 LAB — CBC
HCT: 33 % — ABNORMAL LOW (ref 36.0–46.0)
Hemoglobin: 10.7 g/dL — ABNORMAL LOW (ref 12.0–15.0)
MCH: 28.5 pg (ref 26.0–34.0)
MCHC: 32.4 g/dL (ref 30.0–36.0)
MCV: 88 fL (ref 80.0–100.0)
Platelets: 339 10*3/uL (ref 150–400)
RBC: 3.75 MIL/uL — ABNORMAL LOW (ref 3.87–5.11)
RDW: 15.7 % — ABNORMAL HIGH (ref 11.5–15.5)
WBC: 9.1 10*3/uL (ref 4.0–10.5)
nRBC: 0 % (ref 0.0–0.2)

## 2023-06-26 LAB — BASIC METABOLIC PANEL WITH GFR
Anion gap: 11 (ref 5–15)
BUN: 10 mg/dL (ref 6–20)
CO2: 31 mmol/L (ref 22–32)
Calcium: 9.1 mg/dL (ref 8.9–10.3)
Chloride: 93 mmol/L — ABNORMAL LOW (ref 98–111)
Creatinine, Ser: 0.47 mg/dL (ref 0.44–1.00)
GFR, Estimated: >60 mL/min (ref >60)
Glucose, Bld: 108 mg/dL — ABNORMAL HIGH (ref 70–99)
Potassium: 3.3 mmol/L — ABNORMAL LOW (ref 3.5–5.1)
Sodium: 135 mmol/L (ref 135–145)

## 2023-06-26 LAB — MAGNESIUM: Magnesium: 2.1 mg/dL (ref 1.7–2.4)

## 2023-06-26 MED ORDER — XTAMPZA ER 13.5 MG PO C12A
13.5000 mg | EXTENDED_RELEASE_CAPSULE | Freq: Two times a day (BID) | ORAL | 0 refills | Status: DC
  Filled 2023-06-26: qty 30, 15d supply, fill #0

## 2023-06-26 MED ORDER — HYDROMORPHONE HCL 2 MG PO TABS
2.0000 mg | ORAL_TABLET | ORAL | 0 refills | Status: DC | PRN
  Filled 2023-06-26: qty 90, 30d supply, fill #0

## 2023-06-26 MED ORDER — POTASSIUM CHLORIDE CRYS ER 20 MEQ PO TBCR
40.0000 meq | EXTENDED_RELEASE_TABLET | Freq: Once | ORAL | Status: AC
  Administered 2023-06-26: 40 meq via ORAL
  Filled 2023-06-26: qty 2

## 2023-06-26 MED ORDER — ALUM & MAG HYDROXIDE-SIMETH 200-200-20 MG/5ML PO SUSP
30.0000 mL | Freq: Four times a day (QID) | ORAL | 0 refills | Status: DC | PRN
  Filled 2023-06-26: qty 355, 3d supply, fill #0

## 2023-06-26 MED ORDER — HEPARIN SOD (PORK) LOCK FLUSH 100 UNIT/ML IV SOLN
500.0000 [IU] | Freq: Once | INTRAVENOUS | Status: AC
  Administered 2023-06-26: 500 [IU] via INTRAVENOUS
  Filled 2023-06-26: qty 5

## 2023-06-26 MED ORDER — AMLODIPINE BESYLATE 10 MG PO TABS
10.0000 mg | ORAL_TABLET | Freq: Every day | ORAL | 0 refills | Status: DC
  Filled 2023-06-26: qty 90, 90d supply, fill #0

## 2023-06-26 MED ORDER — SENNA 8.6 MG PO TABS
2.0000 | ORAL_TABLET | Freq: Every day | ORAL | 0 refills | Status: DC
  Filled 2023-06-26: qty 120, 60d supply, fill #0

## 2023-06-26 NOTE — TOC Transition Note (Signed)
 Transition of Care [facility]) - Discharge Note   Patient Details  Name: April [name] MRN: [id number] Date of Birth: November 18, 1975  Transition of Care [facility]) CM/SW Contact:  Levie Ream, RN Phone Number: 06/26/2023, 10:54 AM   Clinical Narrative:    D/C orders received; no TOC needs.   Final next level of care: Home/Self Care Barriers to Discharge: No Barriers Identified   Patient Goals and CMS Choice Patient states their goals for this hospitalization and ongoing recovery are:: Wants to feel better in order to return home CMS Medicare.gov Compare Post Acute Care list provided to::  (n/a) Choice offered to / list presented to : NA [facility] ownership interest in [facility].provided to::  (n/a)    Discharge Placement                       Discharge Plan and Services Additional resources added to the After Visit Summary for   In-house Referral: NA Discharge Planning Services: CM Consult Post Acute Care Choice: NA          DME Arranged: N/A DME Agency: NA       HH Arranged: NA HH Agency: NA        Social Drivers of Health (SDOH) Interventions SDOH Screenings   Food Insecurity: No Food Insecurity (06/21/2023)  Housing: Low Risk  (06/21/2023)  Transportation Needs: No Transportation Needs (06/21/2023)  Utilities: Not At Risk (06/21/2023)  Alcohol Screen: Low Risk  (11/11/2022)  Depression (PHQ2-9): Low Risk  (11/11/2022)  Financial Resource Strain: Low Risk  (11/11/2022)  Physical Activity: Inactive (11/11/2022)  Social Connections: Socially Isolated (01/24/2023)  Stress: No Stress Concern Present (11/11/2022)  Tobacco Use: Medium Risk (06/20/2023)  Health Literacy: Adequate Health Literacy (11/11/2022)     Readmission Risk Interventions    06/23/2023    3:12 PM  Readmission Risk Prevention Plan  Transportation Screening Complete  Medication Review (RN Care Manager) Complete  PCP or Specialist appointment within 3-5 days of  discharge Complete  HRI or Home Care Consult Complete  SW Recovery Care/Counseling Consult Complete  Palliative Care Screening Not Applicable  Skilled Nursing Facility Not Applicable

## 2023-06-26 NOTE — Progress Notes (Signed)
  Daily Progress Note   Patient Name: April [name]       Date: 06/26/2023 DOB: 1975-03-01  Age: 48 y.o. MRN#: [id number] Attending Physician: Rosena Conradi, MD Primary Care Physician: Annella Kief, NP Admit Date: 06/20/2023 Length of Stay: 5 days  Reason for Consultation/Follow-up: Establishing goals of care and Pain control  Subjective:   CC: Pain Management Subjective: Doing well on current regimen, still has early AM severe episodic abdominal pain and cramping.   Pertinent diagnosis: Metastatic colon cancer  Full Code, Full Scope for now.      Objective:   Vital Signs:  BP 107/82 (BP Location: Right Arm)   Pulse 75   Temp (!) 97.4 F (36.3 C) (Oral)   Resp 14   Ht 5' 4 (1.626 m)   Wt 57.5 kg   LMP 01/07/2006   SpO2 100%   BMI 21.75 kg/m   Physical Exam: General: NAD, laying in bed, chronically ill appearing Cardiovascular: RRR Respiratory: no increased work of breathing noted, not in respiratory distress Abdomen: distended Neuro: A&Ox4, following commands easily Psych: appropriately answers all questions   Assessment & Plan:   Assessment: Patient is a 48 year old female with a past medical history of metastatic rectal adenocarcinoma with disease to and liver and complicated by biliary obstruction status post biliary stenting currently on chemotherapy, hypertension, COPD, aortic aneurysm, and prolonged QTc who was admitted on 06/20/2023 for management of worsening abdominal pain associated with nausea/vomiting and intolerance of oral intake.  Patient had last received chemotherapy on 06/19/2023.  Palliative medicine team consulted to assist with complex medical decision making and symptom management.  Recommendations/Plan: # Complex medical decision making/goals of care:  - Discussed care with patient, she has been seen by Dr Scherrie Curt this am, she is being referred to [facility] for clinical trial/salvage therapy.  Noted importance of continuing  conversations with care team moving forward.  - Appreciate chaplain assistance with ACP documents. Patient lives at home with adult children.    -  Code Status: Full Code      # Symptom management: Patient is receiving these palliative interventions for symptom management with an intent to improve quality of life.  - Hydromorphone  4mg  every 4 hours PRN pain (sensitive to manufacturer will check at pharmacy on this) - Oxycontin  20mg  q12 hours -Decadron  taper depending on oncology tx plan  She may be a good candidate for intrathecal pump placement-can discuss at outpatient visit.  # Discharge Planning:  Follow-up in Palliative Care clinic at [facility] Discussed with: Patient    Thank you for allowing the palliative care team to participate in the care [facility].  Flora Humphreys, DO Palliative Medicine

## 2023-06-26 NOTE — Discharge Summary (Signed)
 Physician Discharge Summary  April [name] ZOX:[id number] DOB: Oct 30, 1975 DOA: 06/20/2023  PCP: Annella Kief, NP  Admit date: 06/20/2023 Discharge date: 06/26/2023  Admitted From: Home  Discharge disposition: Home   Recommendations for Outpatient Follow-Up:   Follow up with your primary care provider in one week.  Check CBC, BMP, magnesium  in the next visit Follow-up with oncology and palliative care as outpatient  Discharge Diagnosis:   Principal Problem:   Hypovolemia Active Problems:   Cancer related pain   Hypokalemia   Sepsis (HCC)   Rectal adenocarcinoma (HCC)   Nausea   EKG, abnormal   Counseling and coordination of care   Malignant neoplasm metastatic to liver [facility])   Cancer associated pain   Medication management   Palliative care encounter   DNR (do not resuscitate) discussion   ACP (advance care planning)   Discharge Condition: Improved.  Diet recommendation: .  Regular.  Wound care: None.  Code status: Full.   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  Her last chemotherapy was on 6/6.  Patient reported abdominal pain and rectal discomfort with last bowel movement 6 days prior to presentation.  In the ED, vitals were stable.  Potassium was low at 2.9 with mild leukocytosis at 12.7.  CT angio of the chest without evidence of PE or consolidation. CT scan of the abdomen pelvis with contrast, evidence of metastatic disease, no evidence of acute infection or obstruction.  Patient was then at the hospital for further evaluation and treatment.    Hospital Course:   Following conditions were addressed  during hospitalization as listed below,  Intractable nausea, vomiting and abdominal pain due to chemotherapy. Severe intractable abdominal pain due to metastatic cancer Blood cultures showed staph epidermidis likely to be contaminant.   Patient was seen by palliative care during hospitalization and was adjusted on pain medication regimen including Dilaudid  pump initially.  At this time patient is on long-acting and short acting narcotics.  Communicated with palliative care prior to discharge and prescriptions have been sent.  Recommend follow-up with oncology and palliative care as outpatient..   Hypokalemia and hypomagnesemia: Replenished.  Latest potassium of 3.3.  Will replenish prior to discharge.  Magnesium  2.1.   Essential hypertension.   On amlodipine  clonidine  and metoprolol .  Blood pressure stable at this time   COPD: Appears stable at this time.  No active issues.  Disposition.  At this time, patient is stable for disposition home with oncology, PCP and palliative care follow-up  Medical Consultants:   Palliative care  Procedures:    None Subjective:   Today, patient was seen and examined at bedside.  States that her pain is much better controlled today.  Denies any nausea vomiting fever chills or rigor.  Wants to go home.  Discharge Exam:   Vitals:   06/26/23 0600 06/26/23 0946  BP: 112/89 107/82  Pulse: 76 75  Resp:    Temp: (!) 97.4 F (36.3 C)   SpO2: 99% 100%   Vitals:   06/25/23 1312 06/25/23 2041 06/26/23 0600 06/26/23 0946  BP: 113/85 98/73 112/89 107/82  Pulse: 78 84 76 75  Resp: 17 14    Temp: 97.6 F (36.4 C) 98.6 F (37 C) (!)  97.4 F (36.3 C)   TempSrc: Oral Oral Oral   SpO2: 99% 99% 99% 100%  Weight:      Height:       Body mass index is 21.75 kg/m.  General: Alert awake, not in obvious distress HENT: pupils equally reacting to light,  No scleral pallor or icterus noted. Oral mucosa is moist.  Chest:  Clear breath sounds.  Diminished  breath sounds bilaterally. No crackles or wheezes.  CVS: S1 &S2 heard. No murmur.  Regular rate and rhythm. Abdomen: Soft, nontender, nondistended.  Bowel sounds are heard.   Extremities: No cyanosis, clubbing or edema.  Peripheral pulses are palpable. Psych: Alert, awake and oriented, normal mood CNS:  No cranial nerve deficits.  Power equal in all extremities.   Skin: Warm and dry.  No rashes noted.  The results of significant diagnostics from this hospitalization (including imaging, microbiology, ancillary and laboratory) are listed below for reference.     Diagnostic Studies:   CT ABDOMEN PELVIS W CONTRAST Result Date: 06/21/2023 CLINICAL DATA:  Abdominal EXAM: CT ABDOMEN AND PELVIS WITH CONTRAST TECHNIQUE: Multidetector CT imaging of the abdomen and pelvis was performed using the standard protocol following bolus administration of intravenous contrast. RADIATION DOSE REDUCTION: This exam was performed according to the departmental dose-optimization program which includes automated exposure control, adjustment of the mA and/or kV according to patient size and/or use of iterative reconstruction technique. CONTRAST:  75mL OMNIPAQUE  IOHEXOL  350 MG/ML SOLN COMPARISON:  06/07/2023 FINDINGS: Lower chest: See chest CT report Hepatobiliary: Biliary stents remain in place, unchanged. Mild periportal edema is similar to prior study. Heterogeneous enhancement. Peripheral hypodensities along the surface of the liver, presumably capsular/peritoneal disease. Small amount of perihepatic ascites. Findings stable since prior study. Pancreas: Slight ductal dilatation, stable since prior study. No perihepatic edema or focal abnormality. Spleen: No focal abnormality.  Normal size. Adrenals/Urinary Tract: Bilateral nephrolithiasis, the largest 11 mm in the left midpole. No ureteral stones or hydronephrosis. Adrenal glands and urinary bladder unremarkable. Stomach/Bowel: Again noted is rectal wall thickening, unchanged  since prior study. Moderate to large stool burden throughout the colon. Stomach and small bowel decompressed. No bowel obstruction. Vascular/Lymphatic: Aortoiliac atherosclerosis. No evidence of aneurysm or adenopathy. Reproductive: Left pelvic cyst measures 5.5 cm compared to 5 cm previously, likely left ovarian in nature. Uterus and right adnexa unremarkable. Other: Bulky peritoneal disease noted in the right abdomen and along the liver surface, stable since prior study. 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 No focal or suspicious osseous abnormality. IMPRESSION: Stable heterogeneous enhancement throughout the liver with nodularity along the surface of the liver, likely capsular/peritoneal disease. Small amount of perihepatic ascites. Stable nodularity in the right abdomen, likely peritoneal/omental metastatic disease, stable. Stable biliary ductal stents. Bilateral nephrolithiasis.  No hydronephrosis. Moderate to large stool burden. Circumferential rectal wall thickening/mass, unchanged. Electronically Signed   By: Janeece Mechanic M.D.   On: 06/21/2023 01:44   CT Angio Chest PE W/Cm &/Or Wo Cm Result Date: 06/21/2023 CLINICAL DATA:  Chest pain EXAM: CT ANGIOGRAPHY CHEST WITH CONTRAST TECHNIQUE: Multidetector CT imaging of the chest was performed using the standard protocol during bolus administration of intravenous contrast. Multiplanar CT image reconstructions and MIPs were obtained to evaluate the vascular anatomy. RADIATION DOSE REDUCTION: This exam was performed according to the departmental dose-optimization program which includes automated exposure control, adjustment of the mA and/or kV according to patient size and/or use of iterative reconstruction technique. CONTRAST:  75mL OMNIPAQUE  IOHEXOL  350 MG/ML SOLN  COMPARISON:  05/28/2023 FINDINGS: Cardiovascular: Left PICC line remains in place with the tip at the cavoatrial junction. No filling defects in the pulmonary  arteries to suggest pulmonary emboli. Mild cardiomegaly. Aorta normal caliber. Mediastinum/Nodes: No mediastinal, hilar, or axillary adenopathy. Trachea and esophagus are unremarkable. Thyroid unremarkable. 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 Left lower lobe pulmonary nodule on image 87 measures 3 mm, stable since prior study. Nodule along the left major fissure in the left lower lobe on image 77 is unchanged. Bibasilar atelectasis. Dependent atelectasis in the left lower lobe. No effusions. Upper Abdomen: No acute findings Musculoskeletal: Chest wall soft tissues are unremarkable. No acute bony abnormality. Review of the MIP images confirms the above findings. IMPRESSION: No evidence of pulmonary embolus. Mild cardiomegaly. Small left lower lobe pulmonary nodules measuring up to 3 mm are unchanged since prior study. Dependent and bibasilar atelectasis. Electronically Signed   By: Janeece Mechanic M.D.   On: 06/21/2023 01:37   DG Chest Port 1 View Result Date: 06/20/2023 CLINICAL DATA:  Chest pain EXAM: PORTABLE CHEST 1 VIEW COMPARISON:  05/22/2023 FINDINGS: The heart size and mediastinal contours are within normal limits. Both lungs are clear. The visualized skeletal structures are unremarkable. IMPRESSION: No active disease. Electronically Signed   By: Juanetta Nordmann M.D.   On: 06/20/2023 20:49     Labs:   Basic Metabolic Panel: Recent Labs  Lab 06/20/23 2053 06/20/23 2139 06/21/23 0539 06/22/23 0415 06/26/23 0530  NA 136  --  133*  --  135  K 2.9*  --  3.4*  --  3.3*  CL 92*  --  95*  --  93*  CO2 30  --  29  --  31  GLUCOSE 87  --  83  --  108*  BUN 8  --  7  --  10  CREATININE 0.43*  --  0.37* 0.57 0.47  CALCIUM  8.9  --  8.5*  --  9.1  MG  --  1.8  --   --  2.1  PHOS  --   --  3.0  --   --    GFR Estimated Creatinine Clearance: 73.5 mL/min (by C-G formula based on SCr of 0.47 mg/dL). Liver Function Tests: Recent Labs  Lab 06/20/23 2053  06/21/23 0539  AST 32 25  ALT 18 16  ALKPHOS 715* 606*  BILITOT 0.8 0.7  PROT 7.9 6.9  ALBUMIN 2.9* 2.6*   Recent Labs  Lab 06/21/23 0539  LIPASE 44   No results for input(s): AMMONIA in the last 168 hours. Coagulation profile Recent Labs  Lab 06/20/23 2053  INR 1.2    CBC: Recent Labs  Lab 06/20/23 2053 06/21/23 0539 06/26/23 0530  WBC 12.7* 9.9 9.1  NEUTROABS 11.1*  --   --   HGB 11.5* 10.3* 10.7*  HCT 35.2* 31.6* 33.0*  MCV 86.7 88.3 88.0  PLT 396 311 339   Cardiac Enzymes: No results for input(s): CKTOTAL, CKMB, CKMBINDEX, TROPONINI in the last 168 hours. BNP: Invalid input(s): POCBNP CBG: No results for input(s): GLUCAP in the last 168 hours. D-Dimer No results for input(s): DDIMER in the last 72 hours. Hgb A1c No results for input(s): HGBA1C in the last 72 hours. Lipid Profile No results for input(s): CHOL, HDL, LDLCALC, TRIG, CHOLHDL, LDLDIRECT in the last 72 hours. Thyroid function studies No results for input(s): TSH, T4TOTAL, T3FREE, THYROIDAB in the last 72 hours.  Invalid input(s): FREET3 Anemia work up No  results for input(s): VITAMINB12, FOLATE, FERRITIN, TIBC, IRON, RETICCTPCT in the last 72 hours. Microbiology Recent Results (from the past 240 hours)  Culture, blood (Routine x 2)     Status: Abnormal   Collection Time: 06/20/23  9:16 PM   Specimen: BLOOD  Result Value Ref Range Status   Specimen Description   Final    BLOOD SITE NOT SPECIFIED Performed at [facility], [address], [place], [place] [address]    Special Requests   Final    BOTTLES DRAWN AEROBIC AND ANAEROBIC Blood Culture adequate volume Performed at [facility], [address] [place], [place] [address]    Culture  Setup Time   Final    GRAM POSITIVE COCCI IN CLUSTERS IN BOTH AEROBIC AND ANAEROBIC BOTTLES CRITICAL RESULT CALLED TO, READ BACK BY AND VERIFIED WITH: PHARMD  MARY SWAYNE [id number] 1550 BY J RAZZAK, MT    Culture (A)  Final    STAPHYLOCOCCUS EPIDERMIDIS STAPHYLOCOCCUS SAPROPHYTICUS STAPHYLOCOCCUS HOMINIS THE SIGNIFICANCE OF ISOLATING THIS ORGANISM FROM A SINGLE SET OF BLOOD CULTURES WHEN MULTIPLE SETS ARE DRAWN IS UNCERTAIN. PLEASE NOTIFY THE MICROBIOLOGY DEPARTMENT WITHIN ONE WEEK IF SPECIATION AND SENSITIVITIES ARE REQUIRED. Performed at [facility], [address], [place], [place] [address]    Report Status 06/22/2023 FINAL  Final  Blood Culture ID Panel (Reflexed)     Status: Abnormal   Collection Time: 06/20/23  9:16 PM  Result Value Ref Range Status   Enterococcus faecalis NOT DETECTED NOT DETECTED Final   Enterococcus Faecium NOT DETECTED NOT DETECTED Final   Listeria monocytogenes NOT DETECTED NOT DETECTED Final   Staphylococcus species DETECTED (A) NOT DETECTED Final    Comment: CRITICAL RESULT CALLED TO, READ BACK BY AND VERIFIED WITH: PHARMD MARY SWAYNE [id number] 1550 BY J RAZZAK, MT    Staphylococcus aureus (BCID) NOT DETECTED NOT DETECTED Final   Staphylococcus epidermidis DETECTED (A) NOT DETECTED Final    Comment: Methicillin (oxacillin) resistant coagulase negative staphylococcus. Possible blood culture contaminant (unless isolated from more than one blood culture draw or clinical case suggests pathogenicity). No antibiotic treatment is indicated for blood  culture contaminants. CRITICAL RESULT CALLED TO, READ BACK BY AND VERIFIED WITH: PHARMD MARY SWAYNE [id number] 1550 BY J RAZZAK, MT    Staphylococcus lugdunensis NOT DETECTED NOT DETECTED Final   Streptococcus species NOT DETECTED NOT DETECTED Final   Streptococcus agalactiae NOT DETECTED NOT DETECTED Final   Streptococcus pneumoniae NOT DETECTED NOT DETECTED Final   Streptococcus pyogenes NOT DETECTED NOT DETECTED Final   A.calcoaceticus-baumannii NOT DETECTED NOT DETECTED Final   Bacteroides fragilis NOT DETECTED NOT DETECTED Final   Enterobacterales NOT DETECTED  NOT DETECTED Final   Enterobacter cloacae complex NOT DETECTED NOT DETECTED Final   Escherichia coli NOT DETECTED NOT DETECTED Final   Klebsiella aerogenes NOT DETECTED NOT DETECTED Final   Klebsiella oxytoca NOT DETECTED NOT DETECTED Final   Klebsiella pneumoniae NOT DETECTED NOT DETECTED Final   Proteus species NOT DETECTED NOT DETECTED Final   Salmonella species NOT DETECTED NOT DETECTED Final   Serratia marcescens NOT DETECTED NOT DETECTED Final   Haemophilus influenzae NOT DETECTED NOT DETECTED Final   Neisseria meningitidis NOT DETECTED NOT DETECTED Final   Pseudomonas aeruginosa NOT DETECTED NOT DETECTED Final   Stenotrophomonas maltophilia NOT DETECTED NOT DETECTED Final   Candida albicans NOT DETECTED NOT DETECTED Final   Candida auris NOT DETECTED NOT DETECTED Final   Candida glabrata NOT DETECTED NOT DETECTED Final  Candida krusei NOT DETECTED NOT DETECTED Final   Candida parapsilosis NOT DETECTED NOT DETECTED Final   Candida tropicalis NOT DETECTED NOT DETECTED Final   Cryptococcus neoformans/gattii NOT DETECTED NOT DETECTED Final   Methicillin resistance mecA/C DETECTED (A) NOT DETECTED Final    Comment: CRITICAL RESULT CALLED TO, READ BACK BY AND VERIFIED WITH: Drexel Gentles SWAYNE [id number] 1550 BY Chestine Costain, MT Performed at [facility], [address] [place], [place] [address]   Culture, blood (Routine x 2)     Status: None   Collection Time: 06/21/23  7:03 AM   Specimen: BLOOD  Result Value Ref Range Status   Specimen Description   Final    BLOOD BLOOD RIGHT HAND Performed at [facility], [address] [place], [place] [address]    Special Requests   Final    BOTTLES DRAWN AEROBIC AND ANAEROBIC Blood Culture adequate volume Performed at [facility], [address], [place], [place] [address]    Culture   Final    NO GROWTH 5 DAYS Performed at [facility], [address], [place], [place] [address]     Report Status 06/26/2023 FINAL  Final  Culture, blood (Routine X 2) w Reflex to ID Panel     Status: Abnormal   Collection Time: 06/22/23  1:24 PM   Specimen: BLOOD RIGHT HAND  Result Value Ref Range Status   Specimen Description   Final    BLOOD RIGHT HAND Performed at [facility], [address] [place], [place] [address]    Special Requests   Final    BOTTLES DRAWN AEROBIC AND ANAEROBIC Blood Culture adequate volume Performed at [facility], [address], [place], [place] [address]    Culture  Setup Time   Final    GRAM POSITIVE COCCI IN CLUSTERS ANAEROBIC BOTTLE ONLY CRITICAL VALUE NOTED.  VALUE IS CONSISTENT WITH PREVIOUSLY REPORTED AND CALLED VALUE.    Culture (A)  Final    STAPHYLOCOCCUS HOMINIS SUSCEPTIBILITIES PERFORMED ON PREVIOUS CULTURE WITHIN THE LAST 5 DAYS. Performed at [facility], [address] [place], [place] [address]    Report Status 06/26/2023 FINAL  Final  Culture, blood (Routine X 2) w Reflex to ID Panel     Status: Abnormal   Collection Time: 06/22/23  1:24 PM   Specimen: BLOOD RIGHT HAND  Result Value Ref Range Status   Specimen Description   Final    BLOOD RIGHT HAND Performed at [facility], [address], [place], [place] [address]    Special Requests   Final    BOTTLES DRAWN AEROBIC AND ANAEROBIC Blood Culture adequate volume Performed at [facility], [address], [place], [place] [address]    Culture  Setup Time   Final    GRAM POSITIVE COCCI IN CLUSTERS ANAEROBIC BOTTLE ONLY CRITICAL VALUE NOTED.  VALUE IS CONSISTENT WITH PREVIOUSLY REPORTED AND CALLED VALUE. Performed at [facility], [address] [place], [place] [address]    Culture STAPHYLOCOCCUS HOMINIS (A)  Final   Report Status 06/26/2023 FINAL  Final   Organism ID, Bacteria STAPHYLOCOCCUS HOMINIS  Final      Susceptibility   Staphylococcus hominis - MIC*    CIPROFLOXACIN  <=0.5 SENSITIVE Sensitive      ERYTHROMYCIN <=0.25 SENSITIVE Sensitive     GENTAMICIN <=0.5 SENSITIVE Sensitive     OXACILLIN RESISTANT Resistant     TETRACYCLINE <=1  SENSITIVE Sensitive     VANCOMYCIN  1 SENSITIVE Sensitive     TRIMETH /SULFA  <=10 SENSITIVE Sensitive     CLINDAMYCIN <=0.25 SENSITIVE Sensitive     RIFAMPIN <=0.5 SENSITIVE Sensitive     Inducible Clindamycin NEGATIVE Sensitive     * STAPHYLOCOCCUS HOMINIS     Discharge Instructions:   Discharge Instructions     Call MD for:  severe uncontrolled pain   Complete by: As directed    Diet general   Complete by: As directed    Discharge instructions   Complete by: As directed    Follow-up with oncology and palliative care as outpatient as scheduled.  Take medications as prescribed.  Seek medical attention for worsening symptoms.   Increase activity slowly   Complete by: As directed       Allergies as of 06/26/2023       Reactions   Lisinopril Swelling, Other (See Comments)   Lip swelling and Angioedema   Irinotecan  Other (See Comments)   Stomach cramping Patient given 0.5 mL atropine  IV and was able to continue infusion.  See Progress note on 10/03/20.    Leucovorin  Calcium  Itching, Other (See Comments)   Patient reported itchy palms towards end of transfusion, see progress note 08/04/22.   Oxaliplatin  Itching, Other (See Comments)   Patient reported itchy palms towards end of transfusion, see progress note on 08/04/22.        Medication List     STOP taking these medications    oxyCODONE  5 MG immediate release tablet Commonly known as: Oxy IR/ROXICODONE        TAKE these medications    alum & mag hydroxide-simeth 200-200-20 MG/5ML suspension Commonly known as: MAALOX/MYLANTA Take 30 mLs by mouth every 6 (six) hours as needed for indigestion or heartburn.   amLODipine  10 MG tablet Commonly known as: NORVASC  Take 1 tablet (10 mg total) by mouth daily.   cloNIDine  0.1 MG tablet Commonly known as: CATAPRES  Take 3 tablets (0.3 mg  total) by mouth 3 (three) times daily.   dexamethasone  4 MG tablet Commonly known as: DECADRON  Take 4 mg by mouth See admin instructions. Take 4 mg by mouth twice a day for 3 days beginning on day 2 of chemo.   dicyclomine  20 MG tablet Commonly known as: BENTYL  Take 1 tablet (20 mg total) by mouth 3 (three) times daily as needed for spasms (rectal/AB pain).   docusate sodium  100 MG capsule Commonly known as: COLACE Take 1 capsule (100 mg total) by mouth 2 (two) times daily.   HYDROmorphone  2 MG tablet Commonly known as: DILAUDID  Take 1-2 tablets (2-4 mg total) by mouth every 4 (four) hours as needed for moderate pain (pain score 4-6).   lactulose  10 GM/15ML solution Commonly known as: CHRONULAC  Take 15-30 mLs (10-20 g total) by mouth daily as needed for mild constipation. What changed:  how much to take when to take this   metoprolol  tartrate 100 MG tablet Commonly known as: LOPRESSOR  Take 1 tablet (100 mg total) by mouth 2 (two) times daily.   Normal Saline Flush 0.9 % Soln Use 5 mLs by Intracatheter route daily in biliary drain at 6 (six) AM as directed What changed: additional instructions   pantoprazole  40 MG tablet Commonly known as: PROTONIX  Take 1 tablet (40 mg total) by mouth daily before breakfast. What changed:  when to take this reasons to take this   polyethylene glycol powder 17 GM/SCOOP powder Commonly known as: GLYCOLAX /MIRALAX  Take 17 g by mouth daily  as needed. What changed: reasons to take this   potassium chloride  10 MEQ CR capsule Commonly known as: MICRO-K  Take 2 capsules (20 mEq total) by mouth 2 (two) times daily.   senna 8.6 MG Tabs tablet Commonly known as: SENOKOT Take 2 tablets (17.2 mg total) by mouth at bedtime.   Xtampza  ER 13.5 MG C12a Generic drug: oxyCODONE  ER Take 13.5 mg by mouth every 12 (twelve) hours.        Follow-up Information     Early, Adriane Albe, NP Follow up.   Specialty: Nurse Practitioner Contact  information: [address] [place] [address] [phone]                  Time coordinating discharge: 39 minutes  Signed:  Alisha Bacus  [facility] 06/26/2023, 2:38 PM

## 2023-06-26 NOTE — Care Management Important Message (Signed)
 Important Message  Patient Details IM Letter given to the Patient. Name: April [name] MRN: [id number] Date of Birth: 12-27-1975   Important Message Given:  Yes - Medicare IM     Curtiss Dowdy 06/26/2023, 11:04 AM

## 2023-06-26 NOTE — Plan of Care (Signed)

## 2023-06-27 ENCOUNTER — Other Ambulatory Visit (HOSPITAL_BASED_OUTPATIENT_CLINIC_OR_DEPARTMENT_OTHER): Payer: Self-pay

## 2023-06-29 ENCOUNTER — Telehealth: Payer: Self-pay

## 2023-06-29 NOTE — Transitions of Care (Post Inpatient/ED Visit) (Signed)
   06/29/2023  Name: April [name] MRN: [id number] DOB: 10-Sep-1975  Today's TOC FU Call Status: Today's TOC FU Call Status:: Unsuccessful Call (1st Attempt) Unsuccessful Call (1st Attempt) Date: 06/29/23  Attempted to reach the patient regarding the most recent Inpatient/ED visit.  Follow Up Plan: Additional outreach attempts will be made to reach the patient to complete the Transitions of Care (Post Inpatient/ED visit) call.    Katheryn Pandy MSN, RN RN Case Sales executive Health  [facility] Office Hours M-F [phone] Direct Dial: [phone] Main Phone [phone]  Fax: [phone] [email or web address]

## 2023-07-01 ENCOUNTER — Telehealth: Payer: Self-pay

## 2023-07-01 ENCOUNTER — Encounter: Payer: Self-pay | Admitting: *Deleted

## 2023-07-01 NOTE — Transitions of Care (Post Inpatient/ED Visit) (Signed)
   07/01/2023  Name: April [name] MRN: [id number] DOB: January 13, 1976  Today's TOC FU Call Status: Today's TOC FU Call Status:: Unsuccessful Call (2nd Attempt) Unsuccessful Call (1st Attempt) Date: 07/01/23  Attempted to reach the patient regarding the most recent Inpatient/ED visit.   RN CM NOTE: Last discharge, prior to 06/26/23 discharge, RN CM TOC outreach, patient stated she did not want to be called per chart review of TOC notes. RN CM has tried twice this discharge with no answer and no call backs. Will honor last request not to call patient. She is heavily followed by oncology, palliative providers. Chart review shows scheduled PCP appointment on 06/05/23 and Oncology provider follow pup 6/2/725 for prior diagnosis of  Sigmoid colon cancer, stage IV with a discharge referral to  Dr. Nino Bass at [facility], who has seen April [name] in the past to see if April [name] may be a candidate for a dual agent immunotherapy trial at [facility]. Per provider notes patient  says she has transportation to [place].   06/23/23: Per 06/23/23 Inpatient Initial Narrative (Partial) and TOC RN CM Transition or Discharge note: Inpatient TOC Clinical Narrative:  Partial taken from IP TOC notes.             TOC acknowledges patient with a high risk for readmission. CM at bedside introduced self and explained role. Patient states that she is from home alone where she normally functions independently.  06/26/23: Discharge Plan and Services: Taken from inpatient 06/26/23 RN CM TOC CM transitions note including SDOH screen.  Additional resources added to the After Visit Summary for   In-house Referral: NA Discharge Planning Services: CM Consult Post Acute Care Choice: NA          DME Arranged: N/A DME Agency: NA HH Arranged: NA HH Agency: NA   Social Drivers of Health (SDOH) Interventions SDOH Screenings    Food Insecurity: No Food Insecurity (06/21/2023)  Housing: Low Risk  (06/21/2023)  Transportation Needs: No  Transportation Needs (06/21/2023)  Utilities: Not At Risk (06/21/2023)  Alcohol Screen: Low Risk  (11/11/2022)  Depression (PHQ2-9): Low Risk  (11/11/2022)  Financial Resource Strain: Low Risk  (11/11/2022)  Physical Activity: Inactive (11/11/2022)  Social Connections: Socially Isolated (01/24/2023)  Stress: No Stress Concern Present (11/11/2022)  Tobacco Use: Medium Risk (06/20/2023)  Health Literacy: Adequate Health Literacy (11/11/2022)       Follow Up Plan: No further outreach attempts will be made at this time. We have been unable to contact the patient. And patient requested no calls on discharge prior to 06/26/23 discharge TOC outreach.    Katheryn Pandy MSN, RN RN Case Sales executive Health  [facility] Office Hours M-F [phone] Direct Dial: [phone] Main Phone [phone]  Fax: [phone] [email or web address]

## 2023-07-01 NOTE — Progress Notes (Signed)
 Called [facility] oncology and Ms Haen scheduled with Dr Nino Bass on 07/23/23 at 3 pm

## 2023-07-02 ENCOUNTER — Telehealth: Payer: Self-pay | Admitting: *Deleted

## 2023-07-02 NOTE — Telephone Encounter (Signed)
 Called to request refill for hydrmorphone be sent to [facility] to pick up tomorrow. Also was supposed to have PICC dressing change today and every Thursday and nothing has been scheduled. Needs to come tomorrow and will need transportation arranged. Notified scheduler to reach out to her today and schedule transport

## 2023-07-03 ENCOUNTER — Other Ambulatory Visit: Payer: Self-pay | Admitting: Nurse Practitioner

## 2023-07-03 ENCOUNTER — Inpatient Hospital Stay

## 2023-07-03 ENCOUNTER — Telehealth: Payer: Self-pay | Admitting: *Deleted

## 2023-07-03 VITALS — BP 123/90 | HR 100 | Temp 98.1°F | Resp 18 | Wt 124.3 lb

## 2023-07-03 DIAGNOSIS — Z79899 Other long term (current) drug therapy: Secondary | ICD-10-CM | POA: Diagnosis not present

## 2023-07-03 DIAGNOSIS — R109 Unspecified abdominal pain: Secondary | ICD-10-CM | POA: Diagnosis not present

## 2023-07-03 DIAGNOSIS — C187 Malignant neoplasm of sigmoid colon: Secondary | ICD-10-CM | POA: Diagnosis present

## 2023-07-03 DIAGNOSIS — C786 Secondary malignant neoplasm of retroperitoneum and peritoneum: Secondary | ICD-10-CM | POA: Diagnosis not present

## 2023-07-03 DIAGNOSIS — Z5111 Encounter for antineoplastic chemotherapy: Secondary | ICD-10-CM | POA: Diagnosis present

## 2023-07-03 DIAGNOSIS — Z5112 Encounter for antineoplastic immunotherapy: Secondary | ICD-10-CM | POA: Diagnosis present

## 2023-07-03 DIAGNOSIS — E876 Hypokalemia: Secondary | ICD-10-CM | POA: Diagnosis not present

## 2023-07-03 DIAGNOSIS — Z95828 Presence of other vascular implants and grafts: Secondary | ICD-10-CM

## 2023-07-03 DIAGNOSIS — I10 Essential (primary) hypertension: Secondary | ICD-10-CM | POA: Diagnosis not present

## 2023-07-03 DIAGNOSIS — Z79634 Long term (current) use of topoisomerase inhibitor: Secondary | ICD-10-CM | POA: Diagnosis not present

## 2023-07-03 DIAGNOSIS — G8929 Other chronic pain: Secondary | ICD-10-CM | POA: Diagnosis not present

## 2023-07-03 DIAGNOSIS — C19 Malignant neoplasm of rectosigmoid junction: Secondary | ICD-10-CM

## 2023-07-03 MED ORDER — SODIUM CHLORIDE 0.9% FLUSH
10.0000 mL | Freq: Once | INTRAVENOUS | Status: AC
  Administered 2023-07-03: 10 mL

## 2023-07-03 MED ORDER — HEPARIN SOD (PORK) LOCK FLUSH 100 UNIT/ML IV SOLN
500.0000 [IU] | Freq: Once | INTRAVENOUS | Status: AC
  Administered 2023-07-03: 500 [IU]

## 2023-07-03 NOTE — Patient Instructions (Signed)
 PICC Home Care Guide A peripherally inserted central catheter (PICC) is a form of IV access that allows medicines and IV fluids to be quickly put into the blood and spread throughout the body. The PICC is a long, thin, flexible tube (catheter) that is put into a vein in a person's arm or leg. The catheter ends in a large vein just outside the heart called the superior vena cava (SVC). After the PICC is put in, a chest X-ray may be done to make sure that it is in the right place. A PICC may be placed for different reasons, such as: To give medicines and liquid nutrition. To give IV fluids and blood products. To take blood samples often. If there is trouble placing a peripheral intravenous (PIV) catheter. If cared for properly, a PICC can remain in place for many months. Having a PICC can allow you to go home from the hospital sooner and continue treatment at home. Medicines and PICC care can be managed at home by a family member, caregiver, or home health care team. What are the risks? Generally, having a PICC is safe. However, problems may occur, including: A blood clot (thrombus) forming in or at the end of the PICC. A blood clot forming in a vein (deep vein thrombosis) or traveling to the lung (pulmonary embolism). Inflammation of the vein (phlebitis) in which the PICC is placed. Infection at the insertion site or in the blood. Blood infections from central lines, like PICCs, can be serious and often require a hospital stay. PICC malposition, or PICC movement or poor placement. A break or cut in the PICC. Do not use scissors near the PICC. Nerve or tendon irritation or injury during PICC insertion. How to care for your PICC Please follow the specific guidelines provided by your health care provider. Preventing infection You and any caregivers should wash your hands often with soap and water for at least 20 seconds. Wash hands: Before touching the PICC or the infusion device. Before changing a  bandage (dressing). Do not change the dressing unless you have been taught to do so and have shown you are able to change it safely. Flush the PICC as told. Tell your health care provider right away if the PICC is hard to flush or does not flush. Do not use force to flush the PICC. Use clean and germ-free (sterile) supplies only. Keep the supplies in a dry place. Do not reuse needles, syringes, or any other supplies. Reusing supplies can lead to infection. Keep the PICC dressing dry and secure it with tape if the edges stop sticking to your skin. Check your PICC insertion site every day for signs of infection. Check for: Redness, swelling, or pain. Fluid or blood. Warmth. Pus or a bad smell. Preventing other problems Do not use a syringe that is less than 10 mL to flush the PICC. Do not have your blood pressure checked on the arm in which the PICC is placed. Do not ever pull or tug on the PICC. Keep it secured to your arm with tape or a stretch wrap when not in use. Do not take the PICC out yourself. Only a trained health care provider should remove the PICC. Keep pets and children away from your PICC. How to care for your PICC dressing Keep your PICC dressing clean and dry to prevent infection. Do not take baths, swim, or use a hot tub until your health care provider approves. Ask your health care provider if you can take  showers. You may only be allowed to take sponge baths. When you are allowed to shower: Ask your health care provider to teach you how to wrap the PICC. Cover the PICC with clear plastic wrap and tape to keep it dry while showering. Follow instructions from your health care provider about how to take care of your insertion site and dressing. Make sure you: Wash your hands with soap and water for at least 20 seconds before and after you change your dressing. If soap and water are not available, use hand sanitizer. Change your dressing only if taught to do so by your health care  provider. Your PICC dressing needs to be changed if it becomes loose or wet. Leave stitches (sutures), skin glue, or adhesive strips in place. These skin closures may need to stay in place for 2 weeks or longer. If adhesive strip edges start to loosen and curl up, you may trim the loose edges. Do not remove adhesive strips completely unless your health care provider tells you to do that. Follow these instructions at home: Disposal of supplies Throw away any syringes in a disposal container that is meant for sharp items (sharps container). You can buy a sharps container from a pharmacy, or you can make one by using an empty, hard plastic bottle with a lid. Place any used dressings or infusion bags into a plastic bag. Throw that bag in the trash. General instructions  Always carry your PICC identification card or wear a medical alert bracelet. Keep the tube clamped at all times, unless it is being used. Always carry a smooth-edge clamp with you to clamp the PICC if it breaks. Do not use scissors or sharp objects near the tube. You may bend your arm and move it freely. If your PICC is near or at the bend of your elbow, avoid activity with repeated motion at the elbow. Avoid lifting heavy objects as told by your health care provider. Keep all follow-up visits. This is important. You will need to have your PICC dressing changed at least once a week. Contact a health care provider if: You have pain in your arm, ear, face, or teeth. You have a fever or chills. You have redness, swelling, or pain around the insertion site. You have fluid or blood coming from the insertion site. Your insertion site feels warm to the touch. You have pus or a bad smell coming from the insertion site. Your skin feels hard and raised around the insertion site. Your PICC dressing has gotten wet or is coming off and you have not been taught how to change it. Get help right away if: You have problems with your PICC, such as  your PICC: Was tugged or pulled and has partially come out. Do not  push the PICC back in. Cannot be flushed, is hard to flush, or leaks around the insertion site when it is flushed. Makes a flushing sound when it is flushed. Appears to have a hole or tear. Is accidentally pulled all the way out. If this happens, cover the insertion site with a gauze dressing. Do not throw the PICC away. Your health care provider will need to check it to be sure the entire catheter came out. You feel your heart racing or skipping beats, or you have chest pain. You have shortness of breath or trouble breathing. You have swelling, redness, warmth, or pain in the arm in which the PICC is placed. You have a red streak going up your arm that  starts under the PICC dressing. These symptoms may be an emergency. Get help right away. Call 911. Do not wait to see if the symptoms will go away. Do not drive yourself to the hospital. Summary A peripherally inserted central catheter (PICC) is a long, thin, flexible tube (catheter) that is put into a vein in the arm or leg. If cared for properly, a PICC can remain in place for many months. Having a PICC can allow you to go home from the hospital sooner and continue treatment at home. The PICC is inserted using a germ-free (sterile) technique by a specially trained health care provider. Only a trained health care provider should remove it. Do not have your blood pressure checked on the arm in which your PICC is placed. Always keep your PICC identification card with you. This information is not intended to replace advice given to you by your health care provider. Make sure you discuss any questions you have with your health care provider. Document Revised: 07/18/2020 Document Reviewed: 07/18/2020 Elsevier Patient Education  2024 [employer].

## 2023-07-03 NOTE — Telephone Encounter (Signed)
 Notified April [name] that since palliative team is managing her pain now, she needs to reach out to Dr. Glady Laming for her hydromorphone  refill. This RN sent message to provider, but encouraged her to reach out as well.

## 2023-07-05 ENCOUNTER — Other Ambulatory Visit: Payer: Self-pay | Admitting: Oncology

## 2023-07-06 ENCOUNTER — Other Ambulatory Visit: Payer: Self-pay | Admitting: Internal Medicine

## 2023-07-06 ENCOUNTER — Other Ambulatory Visit (HOSPITAL_BASED_OUTPATIENT_CLINIC_OR_DEPARTMENT_OTHER): Payer: Self-pay

## 2023-07-06 ENCOUNTER — Encounter: Payer: Self-pay | Admitting: Oncology

## 2023-07-06 ENCOUNTER — Other Ambulatory Visit (HOSPITAL_COMMUNITY): Payer: Self-pay

## 2023-07-06 ENCOUNTER — Encounter: Payer: Self-pay | Admitting: Nurse Practitioner

## 2023-07-06 ENCOUNTER — Inpatient Hospital Stay: Admitting: Nurse Practitioner

## 2023-07-06 VITALS — BP 138/104 | HR 104 | Temp 97.9°F | Resp 16 | Ht 64.0 in | Wt 126.7 lb

## 2023-07-06 DIAGNOSIS — K5903 Drug induced constipation: Secondary | ICD-10-CM

## 2023-07-06 DIAGNOSIS — C20 Malignant neoplasm of rectum: Secondary | ICD-10-CM

## 2023-07-06 DIAGNOSIS — Z515 Encounter for palliative care: Secondary | ICD-10-CM

## 2023-07-06 DIAGNOSIS — G893 Neoplasm related pain (acute) (chronic): Secondary | ICD-10-CM

## 2023-07-06 DIAGNOSIS — R530 Neoplastic (malignant) related fatigue: Secondary | ICD-10-CM

## 2023-07-06 DIAGNOSIS — C19 Malignant neoplasm of rectosigmoid junction: Secondary | ICD-10-CM

## 2023-07-06 DIAGNOSIS — R110 Nausea: Secondary | ICD-10-CM | POA: Diagnosis not present

## 2023-07-06 MED ORDER — HYDROMORPHONE HCL 2 MG PO TABS
2.0000 mg | ORAL_TABLET | ORAL | 0 refills | Status: DC | PRN
  Filled 2023-07-06: qty 90, 8d supply, fill #0

## 2023-07-06 MED ORDER — PROCHLORPERAZINE MALEATE 10 MG PO TABS
10.0000 mg | ORAL_TABLET | Freq: Four times a day (QID) | ORAL | 3 refills | Status: DC | PRN
  Filled 2023-07-06: qty 60, 15d supply, fill #0

## 2023-07-06 MED ORDER — HYDROMORPHONE HCL 4 MG PO TABS
4.0000 mg | ORAL_TABLET | ORAL | 0 refills | Status: DC | PRN
  Filled 2023-07-06: qty 90, 8d supply, fill #0

## 2023-07-06 MED ORDER — XTAMPZA ER 18 MG PO C12A
18.0000 mg | EXTENDED_RELEASE_CAPSULE | Freq: Two times a day (BID) | ORAL | 0 refills | Status: DC
  Filled 2023-07-06: qty 60, 30d supply, fill #0

## 2023-07-06 MED ORDER — XTAMPZA ER 13.5 MG PO C12A
13.5000 mg | EXTENDED_RELEASE_CAPSULE | Freq: Two times a day (BID) | ORAL | 0 refills | Status: DC
  Filled 2023-07-06: qty 60, 30d supply, fill #0

## 2023-07-06 MED ORDER — LACTULOSE 10 GM/15ML PO SOLN
10.0000 g | Freq: Every day | ORAL | 2 refills | Status: DC | PRN
Start: 2023-07-06 — End: 2023-09-25
  Filled 2023-07-06: qty 236, 7d supply, fill #0
  Filled 2023-08-15 – 2023-08-27 (×3): qty 236, 7d supply, fill #1

## 2023-07-06 NOTE — Progress Notes (Signed)
 Refills provided for patient -hospital follow-up scheduled. Will discuss pain management options at her next visit including interventional approaches that may be helpful and improve her quality of life.  April General, DO Palliative Medicine

## 2023-07-07 ENCOUNTER — Encounter: Payer: Self-pay | Admitting: Oncology

## 2023-07-07 NOTE — Progress Notes (Signed)
 Palliative Medicine [facility]  [phone] [phone]   Name: April [name] Date: 07/07/2023 MRN: [id number]  DOB: 05-04-1975  Patient Care Team: Oris Camie BRAVO, NP as PCP - General (Nurse Practitioner) Cloretta Arley NOVAK, MD as Consulting Physician (Oncology)    REASON FOR CONSULTATION: April [name] is a 48 y.o. 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 Palliative is seeing patient for symptom management and goals of care.    SOCIAL HISTORY:     reports that she has quit smoking. Her smoking use included cigarettes. She has never used smokeless tobacco. She reports that she does not currently use alcohol. She reports that she does not currently use drugs after having used the following drugs: Marijuana.  ADVANCE DIRECTIVES:  None on file   CODE STATUS: Full code  PAST MEDICAL HISTORY: Past Medical History:  Diagnosis Date   Ascending aortic aneurysm (HCC) 12/16/2022   Benign essential hypertension 10/06/2017   Last Assessment & Plan:  Formatting of this note might be different from the original. Patient's blood pressure noted to be elevated at today's visit 188/98.  Patient will be provided with antihypertensives in the treatment center if needed in order to meet her Avastin  parameters.  Patient recommended to follow up with her primary care physician regarding medication management.  Patient is not cur   Cancer [facility]    Change in bowel habits 06/28/2022   Colon cancer (HCC)    History of colon cancer 06/28/2022   Hypertension    Prolonged QT interval 01/07/2018    Renal calculi    bilateral   Sepsis due to undetermined organism (HCC) 12/24/2022   Sialoadenitis of submandibular gland 12/16/2022    PAST SURGICAL HISTORY:  Past Surgical History:  Procedure Laterality Date   BALLOON DILATION N/A 02/26/2023   Procedure: BALLOON DILATION;  Surgeon: Wilhelmenia Aloha Raddle., MD;  Location: [facility];  Service: Gastroenterology;  Laterality: N/A;   BILIARY BRUSHING  03/24/2022   Procedure: BILIARY BRUSHING;  Surgeon: Wilhelmenia Aloha Raddle., MD;  Location: [facility] END[facility]COPY;  Service: Gastroenterology;;   BILIARY DILATION  03/24/2022   Procedure: BILIARY DILATION;  Surgeon: Wilhelmenia Aloha Raddle., MD;  Location: [facility];  Service: Gastroenterology;;   BILIARY STENT PLACEMENT  03/24/2022   Procedure: BILIARY STENT PLACEMENT;  Surgeon: Wilhelmenia Aloha Raddle., MD;  Location: [facility]OSCOPY;  Service: Gastroenterology;;   BILIARY STENT PLACEMENT N/A 02/26/2023   Procedure: BILIARY STENT PLACEMENT;  Surgeon: Wilhelmenia Aloha Raddle., MD;  Location: [facility];  Service: Gastroenterology;  Laterality: N/A;   BIOPSY  03/24/2022   Procedure: BIOPSY;  Surgeon: Wilhelmenia Aloha Raddle., MD;  Location: [facility] ENDOSCOPY;  Service: Gastroenterology;;   BIOPSY  11/23/2022   Procedure: BIOPSY;  Surgeon: Abran Norleen SAILOR, MD;  Location: [facility];  Service: Gastroenterology;;   BIOPSY  02/26/2023   Procedure: BIOPSY;  Surgeon: Wilhelmenia Aloha Raddle., MD;  Location: [facility];  Service: Gastroenterology;;   CESAREAN SECTION     x2   COLONOSCOPY N/A 11/23/2022   Procedure: COLONOSCOPY;  Surgeon: Abran Norleen SAILOR, MD;  Location: [facility];  Service: Gastroenterology;  Laterality: N/A;   CYSTOSCOPY WITH RETROGRADE PYELOGRAM, URETEROSCOPY AND STENT PLACEMENT Right 10/22/2020   Procedure: CYSTOSCOPY WITH  RETROGRADE PYELOGRAM, URETEROSCOPY AND STENT PLACEMENT;  Surgeon: Elisabeth Valli BIRCH, MD;  Location: [facility];  Service: Urology;  Laterality: Right;   ENDOSCOPIC RETROGRADE  CHOLANGIOPANCREATOGRAPHY (ERCP) WITH PROPOFOL  N/A 02/26/2023   Procedure: ENDOSCOPIC RETROGRADE CHOLANGIOPANCREATOGRAPHY (ERCP) WITH PROPOFOL ;  Surgeon: Wilhelmenia Aloha Raddle., MD;  Location: [facility];  Service: Gastroenterology;  Laterality: N/A;   ERCP N/A 03/24/2022   Procedure: ENDOSCOPIC RETROGRADE CHOLANGIOPANCREATOGRAPHY (ERCP);  Surgeon: Wilhelmenia Aloha Raddle., MD;  Location: [facility] ENDOSCOPY;  Service: Gastroenterology;  Laterality: N/A;   ESOPHAGOGASTRODUODENOSCOPY (EGD) WITH PROPOFOL  N/A 02/26/2023   Procedure: ESOPHAGOGASTRODUODENOSCOPY (EGD) WITH PROPOFOL ;  Surgeon: Wilhelmenia Aloha Raddle., MD;  Location: [facility];  Service: Gastroenterology;  Laterality: N/A;  patient needs EGD in addition to ERCP   IR PATIENT EVAL TECH 0-60 MINS  12/15/2022   IR PERC CHOLECYSTOSTOMY  03/11/2023   IR RADIOLOGIST EVAL & MGMT  09/16/2019   IR RADIOLOGIST EVAL & MGMT  09/23/2019   IR RADIOLOGIST EVAL & MGMT  09/20/2019   IR RADIOLOGIST EVAL & MGMT  09/30/2019   IR RADIOLOGIST EVAL & MGMT  04/20/2023   IR REMOVAL TUN ACCESS W/ PORT W/O FL MOD SED  09/14/2019   IR VENO/EXT/UNI RIGHT  04/11/2022   PANCREATIC STENT PLACEMENT  03/24/2022   Procedure: PANCREATIC STENT PLACEMENT;  Surgeon: Wilhelmenia Aloha Raddle., MD;  Location: [facility] ENDOSCOPY;  Service: Gastroenterology;;   REMOVAL OF STONES  03/24/2022   Procedure: REMOVAL OF STONES;  Surgeon: Wilhelmenia Aloha Raddle., MD;  Location: [facility]COPY;  Service: Gastroenterology;;   REMOVAL OF STONES  02/26/2023   Procedure: REMOVAL OF Sludge;  Surgeon: Wilhelmenia Aloha Raddle., MD;  Location: [facility];  Service: Gastroenterology;;   ANNETT  03/24/2022   Procedure: ANNETT;  Surgeon: Mansouraty, Aloha Raddle., MD;  Location: [facility] ENDOSCOPY;  Service: Gastroenterology;;   CLEDA REMOVAL  02/26/2023   Procedure: STENT REMOVAL;  Surgeon: Wilhelmenia Aloha Raddle., MD;  Location: [facility];  Service: Gastroenterology;;   URETERAL STENT PLACEMENT       HEMATOLOGY/ONCOLOGY HISTORY:  Oncology History  Malignant neoplasm of rectosigmoid (colon) (HCC)  04/08/2019 Initial Diagnosis   Malignant neoplasm of rectosigmoid (colon) (HCC)   07/21/2019 Cancer Staging   Staging form: Colon and Rectum, AJCC 8th Edition - Clinical: Stage IVC (cT4, cN2b, pM1c) - Signed by Cloretta Arley NOVAK, MD on 07/21/2019   07/27/2019 - 01/14/2020 Chemotherapy         09/19/2020 - 06/19/2021 Chemotherapy   Patient is on Treatment Plan : COLORECTAL FOLFIRI + Panitumumab  q14d     09/19/2020 - 06/19/2021 Chemotherapy   Patient is on Treatment Plan : COLORECTAL FOLFIRI + Panitumumab  q14d     10/28/2021 - 03/03/2022 Chemotherapy   Patient is on Treatment Plan : COLORECTAL Bevacizumab  q14d     Colorectal cancer, stage IV (HCC)  10/15/2020 Initial Diagnosis   Colorectal cancer, stage IV (HCC)   04/14/2022 - 09/25/2022 Chemotherapy   Patient is on Treatment Plan : COLORECTAL FOLFOX q14d     01/06/2023 -  Chemotherapy   Patient is on Treatment Plan : COLORECTAL Irinotecan  + Panitumumab  q14d     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  MEDICATIONS:  Current Outpatient Medications  Medication Sig Dispense Refill  oxyCODONE  ER (XTAMPZA  ER) 18 MG C12A Take 1 tablet by mouth every 12 (twelve) hours. 60 capsule 0   prochlorperazine  (COMPAZINE ) 10 MG tablet Take 1 tablet (10 mg total) by mouth every 6 (six) hours as needed for nausea or vomiting. 60 tablet 3   alum & mag hydroxide-simeth (MAALOX/MYLANTA) 200-200-20 MG/5ML suspension Take 30 mLs by mouth every 6 (six) hours as needed for indigestion or heartburn. 355 mL 0   amLODipine  (NORVASC ) 10 MG tablet  Take 1 tablet (10 mg total) by mouth daily. 90 tablet 0   cloNIDine  (CATAPRES ) 0.1 MG tablet Take 3 tablets (0.3 mg total) by mouth 3 (three) times daily. 90 tablet 2   dexamethasone  (DECADRON ) 4 MG tablet Take 4 mg by mouth See admin instructions. Take 4 mg by mouth twice a day for 3 days beginning on day 2 of chemo.     dicyclomine  (BENTYL ) 20 MG tablet Take 1 tablet (20 mg total) by mouth 3 (three) times daily as needed for spasms (rectal/AB pain). 60 tablet 1   docusate sodium  (COLACE) 100 MG capsule Take 1 capsule (100 mg total) by mouth 2 (two) times daily. 10 capsule 0   HYDROmorphone  (DILAUDID ) 4 MG tablet Take 1-2 tablets (4-8 mg total) by mouth every 4 (four) hours as needed for moderate pain (pain score 4-6). 90 tablet 0   lactulose  (CHRONULAC ) 10 GM/15ML solution Take 15-30 mLs (10-20 g total) by mouth daily as needed for mild constipation. 236 mL 2   metoprolol  tartrate (LOPRESSOR ) 100 MG tablet Take 1 tablet (100 mg total) by mouth 2 (two) times daily. 30 tablet 1   pantoprazole  (PROTONIX ) 40 MG tablet Take 1 tablet (40 mg total) by mouth daily before breakfast. (Patient taking differently: Take 40 mg by mouth daily as needed (for reflux or heartburn- 30 minutes before food).) 30 tablet 6   polyethylene glycol powder (GLYCOLAX /MIRALAX ) 17 GM/SCOOP powder Take 17 g by mouth daily as needed. (Patient taking differently: Take 17 g by mouth daily as needed (if Lactulose  is ineffective).) 238 g 0   potassium chloride  (MICRO-K ) 10 MEQ CR capsule Take 2 capsules (20 mEq total) by mouth 2 (two) times daily. 120 capsule 2   senna (SENOKOT) 8.6 MG TABS tablet Take 2 tablets (17.2 mg total) by mouth at bedtime. 120 tablet 0   Sodium Chloride  Flush (NORMAL SALINE FLUSH) 0.9 % SOLN Use 5 mLs by Intracatheter route daily in biliary drain at 6 (six) AM as directed (Patient taking differently: 5 mLs by Intracatheter route daily at 6 (six) AM. Use as directed- PICC line; every 3 days) 150 mL 3   No  current facility-administered medications for this visit.    VITAL SIGNS: BP (!) 138/104 (BP Location: Right Arm, Patient Position: Sitting) Comment: Nurse notified  Pulse (!) 104 Comment: Nurse notified  Temp 97.9 F (36.6 C) (Temporal)   Resp 16   Ht 5' 4 (1.626 m)   Wt 126 lb 11.2 oz (57.5 kg)   LMP 01/07/2006   SpO2 98%   BMI 21.75 kg/m  Filed Weights   07/06/23 1346  Weight: 126 lb 11.2 oz (57.5 kg)    Estimated body mass index is 21.75 kg/m as calculated from the following:   Height as of this encounter: 5' 4 (1.626 m).   Weight as of this encounter: 126 lb 11.2 oz (57.5 kg).  LABS: CBC:    Component Value Date/Time   WBC 9.1 06/26/2023 0530   HGB 10.7 (L) 06/26/2023  0530   HGB 10.8 (L) 06/19/2023 0857   HCT 33.0 (L) 06/26/2023 0530   PLT 339 06/26/2023 0530   PLT 387 06/19/2023 0857   MCV 88.0 06/26/2023 0530   NEUTROABS 11.1 (H) 06/20/2023 2053   LYMPHSABS 1.2 06/20/2023 2053   MONOABS 0.3 06/20/2023 2053   EOSABS 0.0 06/20/2023 2053   BASOSABS 0.0 06/20/2023 2053   Comprehensive Metabolic Panel:    Component Value Date/Time   NA 135 06/26/2023 0530   K 3.3 (L) 06/26/2023 0530   CL 93 (L) 06/26/2023 0530   CO2 31 06/26/2023 0530   BUN 10 06/26/2023 0530   CREATININE 0.47 06/26/2023 0530   CREATININE 0.41 (L) 06/19/2023 0857   GLUCOSE 108 (H) 06/26/2023 0530   CALCIUM  9.1 06/26/2023 0530   AST 25 06/21/2023 0539   AST 40 06/19/2023 0857   ALT 16 06/21/2023 0539   ALT 18 06/19/2023 0857   ALKPHOS 606 (H) 06/21/2023 0539   BILITOT 0.7 06/21/2023 0539   BILITOT 0.3 06/19/2023 0857   PROT 6.9 06/21/2023 0539   ALBUMIN 2.6 (L) 06/21/2023 0539    RADIOGRAPHIC STUDIES: CT ABDOMEN PELVIS W CONTRAST Result Date: 06/21/2023 CLINICAL DATA:  Abdominal EXAM: CT ABDOMEN AND PELVIS WITH CONTRAST TECHNIQUE: Multidetector CT imaging of the abdomen and pelvis was performed using the standard protocol following bolus administration of intravenous contrast.  RADIATION DOSE REDUCTION: This exam was performed according to the departmental dose-optimization program which includes automated exposure control, adjustment of the mA and/or kV according to patient size and/or use of iterative reconstruction technique. CONTRAST:  75mL OMNIPAQUE  IOHEXOL  350 MG/ML SOLN COMPARISON:  06/07/2023 FINDINGS: Lower chest: See chest CT report Hepatobiliary: Biliary stents remain in place, unchanged. Mild periportal edema is similar to prior study. Heterogeneous enhancement. Peripheral hypodensities along the surface of the liver, presumably capsular/peritoneal disease. Small amount of perihepatic ascites. Findings stable since prior study. Pancreas: Slight ductal dilatation, stable since prior study. No perihepatic edema or focal abnormality. Spleen: No focal abnormality.  Normal size. Adrenals/Urinary Tract: Bilateral nephrolithiasis, the largest 11 mm in the left midpole. No ureteral stones or hydronephrosis. Adrenal glands and urinary bladder unremarkable. Stomach/Bowel: Again noted is rectal wall thickening, unchanged since prior study. Moderate to large stool burden throughout the colon. Stomach and small bowel decompressed. No bowel obstruction. Vascular/Lymphatic: Aortoiliac atherosclerosis. No evidence of aneurysm or adenopathy. Reproductive: Left pelvic cyst measures 5.5 cm compared to 5 cm previously, likely left ovarian in nature. Uterus and right adnexa unremarkable. Other: Bulky peritoneal disease noted in the right abdomen and along the liver surface, stable since prior study. 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 No focal or suspicious osseous abnormality. IMPRESSION: Stable heterogeneous enhancement throughout the liver with nodularity along the surface of the liver, likely capsular/peritoneal disease. Small amount of perihepatic ascites. Stable nodularity in the right abdomen, likely peritoneal/omental metastatic disease, stable.  Stable biliary ductal stents. Bilateral nephrolithiasis.  No hydronephrosis. Moderate to large stool burden. Circumferential rectal wall thickening/mass, unchanged. Electronically Signed   By: Franky Crease M.D.   On: 06/21/2023 01:44   CT Angio Chest PE W/Cm &/Or Wo Cm Result Date: 06/21/2023 CLINICAL DATA:  Chest pain EXAM: CT ANGIOGRAPHY CHEST WITH CONTRAST TECHNIQUE: Multidetector CT imaging of the chest was performed using the standard protocol during bolus administration of intravenous contrast. Multiplanar CT image reconstructions and MIPs were obtained to evaluate the vascular anatomy. RADIATION DOSE REDUCTION: This exam was performed according to the departmental dose-optimization program which includes automated  exposure control, adjustment of the mA and/or kV according to patient size and/or use of iterative reconstruction technique. CONTRAST:  75mL OMNIPAQUE  IOHEXOL  350 MG/ML SOLN COMPARISON:  05/28/2023 FINDINGS: Cardiovascular: Left PICC line remains in place with the tip at the cavoatrial junction. No filling defects in the pulmonary arteries to suggest pulmonary emboli. Mild cardiomegaly. Aorta normal caliber. Mediastinum/Nodes: No mediastinal, hilar, or axillary adenopathy. Trachea and esophagus are unremarkable. Thyroid unremarkable. 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 Left lower lobe pulmonary nodule on image 87 measures 3 mm, stable since prior study. Nodule along the left major fissure in the left lower lobe on image 77 is unchanged. Bibasilar atelectasis. Dependent atelectasis in the left lower lobe. No effusions. Upper Abdomen: No acute findings Musculoskeletal: Chest wall soft tissues are unremarkable. No acute bony abnormality. Review of the MIP images confirms the above findings. IMPRESSION: No evidence of pulmonary embolus. Mild cardiomegaly. Small left lower lobe pulmonary nodules measuring up to 3 mm are unchanged since prior study.  Dependent and bibasilar atelectasis. Electronically Signed   By: Franky Crease M.D.   On: 06/21/2023 01:37   DG Chest Port 1 View Result Date: 06/20/2023 CLINICAL DATA:  Chest pain EXAM: PORTABLE CHEST 1 VIEW COMPARISON:  05/22/2023 FINDINGS: The heart size and mediastinal contours are within normal limits. Both lungs are clear. The visualized skeletal structures are unremarkable. IMPRESSION: No active disease. Electronically Signed   By: Franky Stanford M.D.   On: 06/20/2023 20:49    PERFORMANCE STATUS (ECOG) : 1 - Symptomatic but completely ambulatory  Review of Systems  Constitutional:  Positive for activity change, appetite change and fatigue.  Gastrointestinal:  Positive for abdominal pain.  Musculoskeletal:  Positive for back pain.  Unless otherwise noted, a complete review of systems is negative.  Physical Exam General: NAD Cardiovascular: regular rate and rhythm Pulmonary: clear ant fields Abdomen: soft, distended, nontender, + bowel sounds Extremities: no edema, no joint deformities Skin: no rashes Neurological: Alert and oriented x3  IMPRESSION: Discussed the use of AI scribe software for clinical note transcription with the patient, who gave verbal consent to proceed.  History of Present Illness April [name] is a 48 year old female with metastatic gastric cancer who presents to clinic for initial palliative visit. No acute distress noted. No family present. Patient is alert and able to engage appropriately in discussions.   I introduced myself, Maygan RN, and Palliative's role in collaboration with the oncology team. Concept of Palliative Care was introduced as specialized medical care for people and their families living with serious illness.  It focuses on providing relief from the symptoms and stress of a serious illness.  The goal is to improve quality of life for both the patient and the family. Values and goals of care important to patient and family were  attempted to be elicited.   April [name] lives in the home with her three children and is on disability. She is not married and does not have a significant other. She is able to perform all ADLs without difficulty but with some limitations due to abdominal pain and discomfort.   She experiences constipation and takes lactulose , 15ml and takes up to three times a day, but reports that it causes discomfort without effective relief. She has tried Linzess  in the past, which resulted in explosive diarrhea without effective relief. Is not interested in retrying. She has not tried Movantik or Amitiza. She reports a sensation of buildup at the rectum  without effective bowel movements. We discussed increasing her lactulose  to 15-30 mL 1-2 times daily as needed.  Patient reports experiencing nausea, particularly when at home, and takes Reglan  for her nausea, although it is not always effective. She also takes Bentyl , 20 mg three times a day, for stomach cramping and burning. She has Protonix  available but has not been taking it regularly.  Encouraged patient to take Protonix  as prescribed and continue with current antiemetics.  April [name] experiences sharp, stabbing abdominal pain, described as feeling like 'inflated balloons' in her stomach. The pain is exacerbated by eating and drinking, and she finds it difficult to find a comfortable position, although lying on her stomach provides some relief. The pain is severe, reaching a 10 out of 10 on the pain scale, and worsens with breathing and movement. She requires four 2 mg Dilaudid  pills at a time to achieve any relief and takes Xtampza  ER 13.5 mg every 12 hours for pain management.  Education provided on effective pain relief.  She will continue to take Dilaudid  4-8 mg every 6 hours as needed for moderate-severe breakthrough pain.  I will increase her Xtampza  to 18 mg every 12 hours patient was on OxyContin  20 mg during hospitalization.   I had a lengthy  discussion with patient in regards to intrathecal pain pump for pain management in the future.  Education provided on use, efficacy, administration, and placement protocols.  She verbalized understanding and would potentially be interested in considering this as an option if it will allow her better pain relief.  We will plan to continue to closely monitor and adjust medications as needed.  Goals of care We discussed her current illness and what it means in the larger context of her on-going co-morbidities. Natural disease trajectory and expectations were discussed.  Patient is clear in her expressed wishes to continue to treat the treatable allow her every opportunity to continue to thrive.  Her quality of life is most important to her.  Manage all symptoms aggressively to minimize any pain and suffering.  During hospitalization advance directives was discussed.  Patient to complete advanced directives as desired.  I discussed the importance of continued conversation with family and their medical providers regarding overall plan of care and treatment options, ensuring decisions are within the context of the patients values and GOCs. Assessment & Plan Established therapeutic relationship. Education provided on palliative's role in collaboration with their Oncology/Radiation team.  Abdominal pain cancer related Severe abdominal pain, inadequately managed with current Dilaudid  regimen. Discussed potential for pain pump to improve control and reduce oral medication.  Patient's regimen outpatient different from inpatient.  Prior to discharge patient was taking OxyContin  20 mg every 8 hours however discharged on Xtampza  13.5 mg every 12 hours. - Prescribe Dilaudid  4 to 8 mg every 4 hours as needed. - Increase Xtampza  to 18 mg every 12 hours. Will increase to every 8 hours. - Discuss pain pump placement with interventional radiology.  Constipation Constipation with inadequate relief from lactulose .  Previous Linzess  trial ineffective with adverse effects. Plan to adjust lactulose  dosage before considering alternatives. - Provide measuring cup for accurate lactulose  dosing. - Adjust lactulose  to 15 to 30 mL up to three times a day. - Reassess lactulose  effectiveness by Thursday.  Nausea Intermittent nausea, possibly exacerbated by home environment. Current Reglan  regimen insufficient. Protonix  may reduce stomach acid and alleviate nausea. - Resume Protonix  40 mg once daily around breakfast. - Continue Reglan  as needed.  Follow-up Coordination of care to  streamline appointments and ensure timely follow-up. - Move PICC line appointment to current location on July 3rd. - Schedule follow-up visit concurrent with PICC line appointment to reassess pain management and constipation.  Patient expressed understanding and was in agreement with this plan. She also understands that She can call the clinic at any time with any questions, concerns, or complaints.   Thank you for your referral and allowing Palliative to assist in April [name]'s care.   Number and complexity of problems addressed: HIGH - 1 or more chronic illnesses with SEVERE exacerbation, progression, or side effects of treatment - advanced cancer, pain. Any controlled substances utilized were prescribed in the context of palliative care.   Visit consisted of counseling and education dealing with the complex and emotionally intense issues of symptom management and palliative care in the setting of serious and potentially life-threatening illness.  Signed by: Levon Borer, AGPCNP-BC Palliative Medicine Team/[facility]

## 2023-07-09 ENCOUNTER — Encounter

## 2023-07-10 ENCOUNTER — Inpatient Hospital Stay

## 2023-07-10 ENCOUNTER — Inpatient Hospital Stay (HOSPITAL_BASED_OUTPATIENT_CLINIC_OR_DEPARTMENT_OTHER): Admitting: Nurse Practitioner

## 2023-07-10 ENCOUNTER — Encounter: Payer: Self-pay | Admitting: Nurse Practitioner

## 2023-07-10 VITALS — BP 142/90 | HR 93 | Temp 98.0°F | Resp 18 | Ht 64.0 in | Wt 128.4 lb

## 2023-07-10 DIAGNOSIS — C19 Malignant neoplasm of rectosigmoid junction: Secondary | ICD-10-CM

## 2023-07-10 DIAGNOSIS — Z95828 Presence of other vascular implants and grafts: Secondary | ICD-10-CM

## 2023-07-10 DIAGNOSIS — Z5111 Encounter for antineoplastic chemotherapy: Secondary | ICD-10-CM | POA: Diagnosis not present

## 2023-07-10 LAB — CMP (CANCER CENTER ONLY)
ALT: 32 U/L (ref 0–44)
AST: 60 U/L — ABNORMAL HIGH (ref 15–41)
Albumin: 3.3 g/dL — ABNORMAL LOW (ref 3.5–5.0)
Alkaline Phosphatase: 785 U/L — ABNORMAL HIGH (ref 38–126)
Anion gap: 8 (ref 5–15)
BUN: 6 mg/dL (ref 6–20)
CO2: 32 mmol/L (ref 22–32)
Calcium: 9.3 mg/dL (ref 8.9–10.3)
Chloride: 98 mmol/L (ref 98–111)
Creatinine: 0.4 mg/dL — ABNORMAL LOW (ref 0.44–1.00)
GFR, Estimated: >60 mL/min (ref >60)
Glucose, Bld: 80 mg/dL (ref 70–99)
Potassium: 3.2 mmol/L — ABNORMAL LOW (ref 3.5–5.1)
Sodium: 138 mmol/L (ref 135–145)
Total Bilirubin: 0.3 mg/dL (ref 0.0–1.2)
Total Protein: 7.1 g/dL (ref 6.5–8.1)

## 2023-07-10 LAB — CBC WITH DIFFERENTIAL (CANCER CENTER ONLY)
Abs Immature Granulocytes: 0.03 10*3/uL (ref 0.00–0.07)
Basophils Absolute: 0.1 10*3/uL (ref 0.0–0.1)
Basophils Relative: 1 %
Eosinophils Absolute: 0.4 10*3/uL (ref 0.0–0.5)
Eosinophils Relative: 4 %
HCT: 30.7 % — ABNORMAL LOW (ref 36.0–46.0)
Hemoglobin: 10 g/dL — ABNORMAL LOW (ref 12.0–15.0)
Immature Granulocytes: 0 %
Lymphocytes Relative: 25 %
Lymphs Abs: 2.9 10*3/uL (ref 0.7–4.0)
MCH: 28.2 pg (ref 26.0–34.0)
MCHC: 32.6 g/dL (ref 30.0–36.0)
MCV: 86.7 fL (ref 80.0–100.0)
Monocytes Absolute: 0.9 10*3/uL (ref 0.1–1.0)
Monocytes Relative: 8 %
Neutro Abs: 7.3 10*3/uL (ref 1.7–7.7)
Neutrophils Relative %: 62 %
Platelet Count: 340 10*3/uL (ref 150–400)
RBC: 3.54 MIL/uL — ABNORMAL LOW (ref 3.87–5.11)
RDW: 16 % — ABNORMAL HIGH (ref 11.5–15.5)
WBC Count: 11.6 10*3/uL — ABNORMAL HIGH (ref 4.0–10.5)
nRBC: 0 % (ref 0.0–0.2)

## 2023-07-10 LAB — MAGNESIUM: Magnesium: 1.8 mg/dL (ref 1.7–2.4)

## 2023-07-10 LAB — CEA (ACCESS): CEA (CHCC): 65.9 ng/mL — ABNORMAL HIGH (ref 0.00–5.00)

## 2023-07-10 MED ORDER — HEPARIN SOD (PORK) LOCK FLUSH 100 UNIT/ML IV SOLN
500.0000 [IU] | Freq: Once | INTRAVENOUS | Status: AC
  Administered 2023-07-10: 400 [IU]

## 2023-07-10 MED ORDER — SODIUM CHLORIDE 0.9% FLUSH
10.0000 mL | Freq: Once | INTRAVENOUS | Status: AC
  Administered 2023-07-10: 10 mL

## 2023-07-10 NOTE — Progress Notes (Signed)
 [facility] OFFICE PROGRESS NOTE   Diagnosis: Colon cancer  INTERVAL HISTORY:   April [name] returns as scheduled.  She completed a cycle of irinotecan /Panitumumab  06/19/2023.  She was hospitalized 06/20/2023 with abdominal pain and constipation.    CT abdomen/pelvis 06/21/2023 showed stable nodularity in the right abdomen, likely peritoneal/omental metastatic disease; stable heterogeneous enhancement throughout the liver with nodularity along the surface of the liver, likely capsular/peritoneal disease; stable biliary ductal stents; moderate to large stool burden; circumferential rectal wall thickening/mass unchanged.  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  She is followed by palliative care for pain management.  Current regimen consist of Xtampza  18 mg every 12 hours with Dilaudid  as needed for breakthrough pain.  She reports good pain control.  With each dose of Xtampza  ER she takes 2 Dilaudid  tablets.  If she needs pain medication during the day she will typically take 1 Dilaudid  tablet.  Bowels overall moving regularly with lactulose  3 times daily.  No diarrhea.  She denies nausea/vomiting.  No mouth sores.  Stable appetite.  Objective:  Vital signs in last 24 hours:  Blood pressure (!) 142/90, pulse 93, temperature 98 F (36.7 C), temperature source Temporal, resp. rate 18, height 5' 4 (1.626 m), weight 128 lb 6.4 oz (58.2 kg), last menstrual period 01/07/2006, SpO2 98%.    Resp: Lungs clear bilaterally. Cardio: Regular rate and rhythm. GI: Abdomen is soft, tenderness at the upper mid abdomen.  No mass.  No hepatomegaly. Vascular: No leg edema. PICC site is without erythema.  Lab Results:  Lab Results  Component Value Date   WBC 11.6 (H) 07/10/2023   HGB 10.0 (L) 07/10/2023   HCT 30.7 (L) 07/10/2023   MCV 86.7 07/10/2023   PLT 340 07/10/2023   NEUTROABS 7.3 07/10/2023    Imaging:  No  results found.  Medications: I have reviewed the patient's current medications.  Assessment/Plan: Sigmoid colon cancer, stage IV (pT4a,pN2b,M1c) Colonoscopy 03/24/2019-3 rectal polyps-hyperplastic polyps, distal colon biopsy-at least intramucosal adenocarcinoma, completely obstructing mass in the distal sigmoid colon, could not be traversed, intact mismatch repair protein expression 03/24/2019-CEA 56.1 03/29/2019 CT abdomen/pelvis-circumferential thickening involving the entire mid and distal sigmoid colon to the level of the rectosigmoid junction, solid/cystic mass in the left ovary, bilateral nephrolithiasis Robotic assisted low anterior resection, mesenteric lymphadenectomy, bilateral salpingo-oophorectomy 04/08/2019 Pathology ([facility] review of outside pathology) metastatic adenocarcinoma involving the peritoneum overlying the round ligament, serosa of the urinary bladder, serosa of the right ureter a sacral area, pelvic peritoneum, and left ovary.  Omental biopsy with focal mucin pools with no carcinoma cells identified, right hemidiaphragm biopsy involved by metastatic adenocarcinoma, invasive adenocarcinoma the sigmoid colon, moderately differentiated, T4a, perineural and vascular invasion present, 7/22 lymph nodes, multiple tumor deposits, resection margins negative Negative for PD-L1, low probability of MSI-high, HER-2 negative, negative for BRAF, NRAS and KRAS alterations CTs 05/19/2019-no evidence of metastatic disease, findings suspicious for colitis of the transverse and ascending colon, small amount of ascites in the cul-de-sac, bilateral renal calculi Cycle 1 FOLFIRI 05/24/2019, bevacizumab  added with cycle 2 Cycle 4 FOLFIRI/bevacizumab  07/05/2019 Cycle 5 FOLFIRI 07/27/2019, bevacizumab  held secondary to hypertension Cycle 6 FOLFIRI 08/10/1999, bevacizumab  held secondary to hypertension CTs 08/30/2019-no evidence of recurrent disease, new subsolid right lower lobe nodule felt to be inflammatory,  emphysema Cycle 7 FOLFIRI 09/26/2019, bevacizumab  remains on hold secondary to hypertension Cycle 8 FOLFIRI 10/17/2019, bevacizumab  held secondary to hypertension, Udenyca  added for neutropenia Cycle 9 FOLFIRI  11/07/2019, bevacizumab  held secondary to hypertension, Udenyca  Cycle 10 FOLFIRI 11/28/2019, bevacizumab  held, Udenyca  Cycle 11 FOLFIRI 12/26/2019, bevacizumab  held, Udenyca  CTs 01/25/2020-no evidence of recurrent disease, resolution of right lower lobe nodule Maintenance Xeloda  beginning 02/06/2020 CTs 04/25/2020- no evidence of metastatic disease, multiple bilateral renal calculi without hydronephrosis Maintenance Xeloda  continued CT abdomen/pelvis without contrast 08/10/2020-bilateral staghorn renal calculi, no evidence of metastatic disease; addendum 08/23/2020-small but increasing omental nodules. Cycle 1 FOLFIRI/panitumumab  09/19/2020 Cycle 2 FOLFIRI/Panitumumab  10/03/2020 Cycle 3 FOLFIRI/Panitumumab  10/17/2020 Cycle 4 FOLFIRI/panitumumab  10/31/2020 Cycle 5 FOLFIRI/Panitumumab  11/14/2020 CTs 11/26/2020-stable omental metastases compared to 10/22/2020, mildly decreased from 08/10/2020.  Left lower quadrant tiny paracolic gutter implant slightly decreased from CT 08/10/2020.  No new or progressive metastatic disease in the abdomen or pelvis. Cycle 6 FOLFIRI/Panitumumab  11/28/2020, irinotecan  dose reduced, treatment schedule adjusted to every 3 weeks going forward Cycle 7 FOLFIRI/panitumumab  12/24/2020 Cycle 8 FOLFIRI/Panitumumab  01/16/2021--treatment held due to hypertension in the infusion area. Cycle 8 FOLFIRI/panitumumab  01/21/2021 Cycle 9 FOLFIRI/panitumumab  02/11/2021 Cycle 10 FOLFIRI/Panitumumab  03/04/2021 Cycle 11 FOLFIRI/Panitumumab  03/25/2021 CT abdomen/pelvis 04/10/2021-stable right omental and left posterior paracolic gutter nodules Cycle 12 FOLFIRI/panitumumab  04/15/2021 Cycle 13 FOLFIRI/panitumumab  05/06/2021 Cycle 14 FOLFIRI/Panitumumab  05/27/2021 Cycle 15 FOLFIRI/Panitumumab  06/17/2021 CT  abdomen/pelvis 07/05/2021-slight enlargement of a dominant right omental implant, no new implants Patient requested a treatment break CT abdomen/pelvis 10/04/2021-mild increase in size of multiple omental soft tissue nodules, 4 mm calculus in the distal left ureter adjacent to the ureteral stent Cycle 1 Lonsurf  10/14/2021 10/28/2021 Avastin  every 2 weeks Cycle 2 Lonsurf  11/11/2021 Cycle 3 Lonsurf  12/09/2021 Cycle 4 Lonsurf  01/06/2022 Avastin  held 01/20/2022 due to proteinuria, 24-hour urine 43 mg CT/pelvis 01/30/2022-possible new peritoneal implant at the transverse colon, no significant change in other omental/peritoneal implants, stable chronic rectal wall thickening, status post removal of double-J left ureteral stent with no evidence of hydronephrosis or ureteral calculus, nonobstructing bilateral renal calculi Cycle 5 Lonsurf  02/05/2022 or 02/06/2022 Avastin  every 2 weeks Cycle 6 Lonsurf  03/03/2022 CTs 04/05/2022-increase in size and number of peritoneal implants compared to January 2024 central liver mass narrowing the anterior branch of the right portal vein, decreased biliary duct dilation, biliary stents in place, stable right cardiophrenic lymph nodes, separate pigtail stent in the lumen of the proximal duodenum Cycle 1 FOLFOX 04/14/2022 CT abdomen/pelvis 04/16/2022-stent within the third portion of the duodenum, stable peritoneal nodules, indwelling biliary stents with mild left biliary duct dilation Cycle 2 FOLFOX 04/28/2022, Emend and prophylactic dexamethasone  added Cycle 3 FOLFOX 05/12/2022, Aloxi , Emend, prophylactic dexamethasone , Compazine  and lorazepam  as needed Chemotherapy held 05/25/2020 due to severe hypertension, evaluated in the emergency department Cycle 4 FOLFOX 06/03/2022 Cycle 5 FOLFOX 06/16/2022, oxaliplatin  dose reduced and 5-FU bolus eliminated due to neutropenia CT 06/25/2022-unchanged soft tissue at the hepatic hilum, unchanged, bile duct stent, decreased size of peritoneal and  omental nodules and diminished volume of likely fluid in the lower left pelvis Cycle 6 FOLFOX 06/30/2022 Cycle 7 FOLFOX 07/21/2022 CT 07/24/2022 (in the emergency department with nausea, headache, abdominal pain)-no acute pathology.  No significant interval change in omental nodularity. Cycle 8 FOLFOX 08/04/2022, pruritus on the palms at the end of the oxaliplatin  infusion, Pepcid  and Benadryl  given, symptoms resolved, infusion not resumed Cycle 9 FOLFOX 08/18/2022-Pepcid  and Benadryl  added to premedications, Oxaliplatin  diluted in a larger volume and infusion time increased Cycle 10 FOLFOX 09/08/2022 Cycle 11 FOLFOX 09/22/2022 10/06/2022 patient declined treatment CTs 10/15/2022-similar soft tissue fullness in the hepatic hilum with biliary stents in place, decrease in omental/peritoneal metastases Maintenance capecitabine  10/26/2022 CT abdomen/pelvis 11/19/2022-mild increase in  tumor studding at the left paracolic gutter and omentum, circumferential wall thickening of the rectum, biliary stents in place with minimal increase in intrahepatic biliary dilation, nonobstructive renal calculi Colonoscopy 11/23/2022-anastomosis at 15 cm from the anal verge-patent, 2 cm polypoid tissue at the anastomosis biopsied, invasive adenocarcinoma moderately differentiated, preserved expression of major MMR proteins CTs 12/15/2022: Possible developing nodule in the lateral left liver, increased omental nodularity, persistent wall thickening in the upper rectum CT abdomen/pelvis 12/24/2022: Increased thickened fold in the transverse colon-infectious/inflammatory, 2.1 cm lateral left liver lesion, potential new 5 mm segment 5 lesion, stable biliary stents with intrahepatic biliary dilation, central biliary wall thickening near the hepatic hilum, multiple peritoneal/omental implants unchanged from 12/15/2022, but likely larger than 11/19/2022 Cycle 1 irinotecan /Panitumumab  01/06/2023 Cycle 2 irinotecan /Panitumumab  01/20/2023 CTs  01/24/2023-slight increase in omental nodularity. Cycle 3 irinotecan /Panitumumab  02/03/2023 Cycle 4 irinotecan /panitumumab  02/17/2023 CTs abdomen/pelvis 02/24/2023-subtly hypoattenuating lesion within the peripheral aspect of hepatic segment 2 redemonstrated.  Additional scattered subcentimeter low-density lesions within the liver too small to characterize.  Prominent intrahepatic biliary dilatation with biliary stents in place.  Similar degree of nodularity along the omentum and left paracolic gutter.  No ascites. CTs 03/09/2023-indwelling endoscopically placed stents; level of intrahepatic biliary duct dilatation is slightly improved on the left and some on the right.  Gallbladder is dilated with wall edema which is increasing.  More nodular tissue extending up along the porta hepatis.  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  Rectum with nodular wall thickening with extension into the adjacent tissue, nodular areas seen extending perirectal and presacral spaces.  Peritoneal carcinomatosis again identified. Irinotecan /Panitumumab  04/02/2023 Irinotecan /panitumumab  04/16/2023 Irinotecan /Panitumumab  05/07/2023 Irinotecan /Panitumumab  06/19/2023 CT abdomen/pelvis 06/21/2023-stable heterogeneous enhancement throughout the liver with nodularity along the surface of the liver.  Small amount of perihepatic ascites.  Stable nodularity right abdomen.  Circumferential rectal wall thickening/mass unchanged. Irinotecan /Panitumumab  discontinued 07/10/2023 due to lack of clinical benefit/poor tolerance Appointment with Dr. Sheilda 07/23/2023   Hypertension G4 P3, twins Kidney stones-bilateral staghorn renal calculi on CT 08/10/2020 Infected Port-A-Cath 09/12/2019-placed on Augmentin , referred for Port-A-Cath removal; Port-A-Cath removed 09/14/2019; PICC line placed 09/14/2019; culture staph aureus.  Course of Septra  completed. Neutropenia secondary to chemotherapy-Udenyca  added with cycle 8 FOLFIRI Right nephrostomy tube  09/12/2020; stent placed 10/09/2020, percutaneous nephrostolithotomy treatment of right-sided kidney stones 10/09/2020, malpositioned right ureter stent replacement 10/23/2020, right ureter stent removed 10/29/2020 Percutaneous left nephrostolithotomy 10/01/2021-no renal stone identified, left ureter stent left in place Cystoscopy 10/14/2021-stent removed 8.  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  2 moderate biliary strictures found in the hepatic duct system, malignant appearing.  Left and right hepatic duct and all intrahepatic branches were moderately dilated secondary to the strictures.  The strictures were dilated.  Plastic stent placed into the left hepatic duct and right hepatic duct. 9.  Admission 04/16/2022 with nausea/vomiting and abdominal pain, felt to be acute toxicity related to chemotherapy, discharged home 04/18/2022 10.  Admission 11/19/2022 with nausea/vomiting and abdominal pain-improved 11.  Admission 12/15/2022 with fever and right submandibular swelling/tenderness CT neck  12/15/2022: 2 mm distal right submandibular duct calculus with mild inflammatory change adjacent to the right submandibular gland 12.  Admission 12/24/2022 with a fever nausea/vomiting, diarrhea, Klebsiella bacteremia completed course of Augmentin  13.  Admission with abdominal pain and nausea/vomiting 03/09/2023 through 03/14/2023-concern  for gallbladder inflammation.  Not felt to be a surgical candidate.  Percutaneous cholecystostomy tube placed 03/11/2023.  Tube removed 04/24/2023. 14.  Admission 06/20/2023 with abdominal pain and constipation, pain improved partially following a bowel movement  Disposition: April [name] appears stable.  She has most recently been treated with irinotecan /Panitumumab .  Following the most recent cycle 06/19/2023 she was subsequently hospitalized with nausea, abdominal pain and constipation.  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  She is being followed by palliative for pain management and reports good pain control with a combination of Xtampza  and Dilaudid .  Bowels moving regularly on the current laxative regimen.  She will continue the same.  CBC and chemistry panel reviewed.  She has persistent mild hypokalemia.  She will try to adhere to the recommended dose for potassium.  Current treatment with irinotecan /Panitumumab  is being discontinued due to lack of clinical benefit/poor tolerance.  She has been referred to Dr. Sheilda at [facility] for treatment option recommendations/clinical trial availability.  Has appointment 07/23/2023.  She will return for follow-up here on 07/29/2023.  We are available to see her sooner if needed.    Olam Ned ANP/GNP-BC   07/10/2023  10:32 AM

## 2023-07-10 NOTE — Patient Instructions (Signed)
 Labs and dressing change done. PICC flushed and heparin  locked KK LPN

## 2023-07-14 ENCOUNTER — Other Ambulatory Visit (HOSPITAL_COMMUNITY): Payer: Self-pay

## 2023-07-14 ENCOUNTER — Other Ambulatory Visit: Payer: Self-pay | Admitting: Nurse Practitioner

## 2023-07-14 ENCOUNTER — Telehealth: Payer: Self-pay | Admitting: Oncology

## 2023-07-14 DIAGNOSIS — C19 Malignant neoplasm of rectosigmoid junction: Secondary | ICD-10-CM

## 2023-07-14 DIAGNOSIS — G893 Neoplasm related pain (acute) (chronic): Secondary | ICD-10-CM

## 2023-07-14 DIAGNOSIS — Z515 Encounter for palliative care: Secondary | ICD-10-CM

## 2023-07-14 MED ORDER — HYDROMORPHONE HCL 4 MG PO TABS
4.0000 mg | ORAL_TABLET | ORAL | 0 refills | Status: DC | PRN
Start: 2023-07-14 — End: 2023-07-28
  Filled 2023-07-14: qty 120, 10d supply, fill #0

## 2023-07-14 NOTE — Telephone Encounter (Signed)
 PT is scheduled and appt confirmed

## 2023-07-16 ENCOUNTER — Encounter

## 2023-07-16 ENCOUNTER — Inpatient Hospital Stay: Attending: Oncology

## 2023-07-16 ENCOUNTER — Inpatient Hospital Stay

## 2023-07-23 ENCOUNTER — Encounter

## 2023-07-23 ENCOUNTER — Telehealth: Payer: Self-pay | Admitting: Oncology

## 2023-07-23 ENCOUNTER — Inpatient Hospital Stay: Attending: Oncology

## 2023-07-23 NOTE — Telephone Encounter (Signed)
 PT has been rescheduled and Appt confirmed

## 2023-07-24 ENCOUNTER — Inpatient Hospital Stay: Attending: Oncology

## 2023-07-24 VITALS — BP 155/100 | HR 93 | Temp 98.1°F | Resp 18

## 2023-07-24 DIAGNOSIS — Z452 Encounter for adjustment and management of vascular access device: Secondary | ICD-10-CM | POA: Diagnosis present

## 2023-07-24 DIAGNOSIS — Z95828 Presence of other vascular implants and grafts: Secondary | ICD-10-CM

## 2023-07-24 DIAGNOSIS — C187 Malignant neoplasm of sigmoid colon: Secondary | ICD-10-CM | POA: Insufficient documentation

## 2023-07-24 DIAGNOSIS — C19 Malignant neoplasm of rectosigmoid junction: Secondary | ICD-10-CM

## 2023-07-24 DIAGNOSIS — C786 Secondary malignant neoplasm of retroperitoneum and peritoneum: Secondary | ICD-10-CM | POA: Insufficient documentation

## 2023-07-24 MED ORDER — SODIUM CHLORIDE 0.9% FLUSH
10.0000 mL | Freq: Once | INTRAVENOUS | Status: AC
  Administered 2023-07-24: 10 mL

## 2023-07-24 MED ORDER — HEPARIN SOD (PORK) LOCK FLUSH 100 UNIT/ML IV SOLN
500.0000 [IU] | Freq: Once | INTRAVENOUS | Status: AC
  Administered 2023-07-24: 500 [IU]

## 2023-07-27 ENCOUNTER — Telehealth: Payer: Self-pay | Admitting: Nurse Practitioner

## 2023-07-27 NOTE — Telephone Encounter (Signed)
 Called the patient to reschedule missed appointment for Palliative. The patients daughter answered the phone and said she would tell her mom to call us  back to schedule.

## 2023-07-28 ENCOUNTER — Encounter: Payer: Self-pay | Admitting: Nurse Practitioner

## 2023-07-28 ENCOUNTER — Other Ambulatory Visit: Payer: Self-pay | Admitting: Nurse Practitioner

## 2023-07-28 ENCOUNTER — Other Ambulatory Visit (HOSPITAL_COMMUNITY): Payer: Self-pay

## 2023-07-28 DIAGNOSIS — C19 Malignant neoplasm of rectosigmoid junction: Secondary | ICD-10-CM

## 2023-07-28 DIAGNOSIS — Z515 Encounter for palliative care: Secondary | ICD-10-CM

## 2023-07-28 DIAGNOSIS — G893 Neoplasm related pain (acute) (chronic): Secondary | ICD-10-CM

## 2023-07-28 MED ORDER — HYDROMORPHONE HCL 4 MG PO TABS
4.0000 mg | ORAL_TABLET | ORAL | 0 refills | Status: DC | PRN
Start: 2023-07-28 — End: 2023-08-12
  Filled 2023-07-28: qty 120, 10d supply, fill #0

## 2023-07-28 MED ORDER — XTAMPZA ER 18 MG PO C12A
18.0000 mg | EXTENDED_RELEASE_CAPSULE | Freq: Two times a day (BID) | ORAL | 0 refills | Status: DC
  Filled 2023-07-28 – 2023-08-01 (×2): qty 60, 30d supply, fill #0

## 2023-07-29 ENCOUNTER — Inpatient Hospital Stay

## 2023-07-29 ENCOUNTER — Other Ambulatory Visit (HOSPITAL_COMMUNITY): Payer: Self-pay

## 2023-07-29 ENCOUNTER — Other Ambulatory Visit: Payer: Self-pay

## 2023-07-29 ENCOUNTER — Inpatient Hospital Stay: Admitting: Oncology

## 2023-07-29 ENCOUNTER — Telehealth: Payer: Self-pay

## 2023-07-29 DIAGNOSIS — C19 Malignant neoplasm of rectosigmoid junction: Secondary | ICD-10-CM

## 2023-07-29 NOTE — Telephone Encounter (Signed)
 Called patient to see if she would be able to make her appointment today at 10:30am. Patient stated that she did not know about the appointment until this morning and she would not be coming in. Scheduler and provider will be made aware.

## 2023-07-30 ENCOUNTER — Inpatient Hospital Stay

## 2023-07-30 ENCOUNTER — Ambulatory Visit: Payer: Self-pay | Admitting: Oncology

## 2023-07-30 ENCOUNTER — Inpatient Hospital Stay (HOSPITAL_BASED_OUTPATIENT_CLINIC_OR_DEPARTMENT_OTHER): Admitting: Oncology

## 2023-07-30 VITALS — BP 148/98 | HR 100 | Temp 98.3°F | Resp 16 | Ht 64.0 in | Wt 121.9 lb

## 2023-07-30 DIAGNOSIS — C19 Malignant neoplasm of rectosigmoid junction: Secondary | ICD-10-CM

## 2023-07-30 DIAGNOSIS — Z452 Encounter for adjustment and management of vascular access device: Secondary | ICD-10-CM | POA: Diagnosis not present

## 2023-07-30 LAB — CBC WITH DIFFERENTIAL (CANCER CENTER ONLY)
Abs Immature Granulocytes: 0.04 K/uL (ref 0.00–0.07)
Basophils Absolute: 0.1 K/uL (ref 0.0–0.1)
Basophils Relative: 1 %
Eosinophils Absolute: 0.2 K/uL (ref 0.0–0.5)
Eosinophils Relative: 2 %
HCT: 37.1 % (ref 36.0–46.0)
Hemoglobin: 12 g/dL (ref 12.0–15.0)
Immature Granulocytes: 0 %
Lymphocytes Relative: 26 %
Lymphs Abs: 2.7 K/uL (ref 0.7–4.0)
MCH: 27.6 pg (ref 26.0–34.0)
MCHC: 32.3 g/dL (ref 30.0–36.0)
MCV: 85.3 fL (ref 80.0–100.0)
Monocytes Absolute: 0.9 K/uL (ref 0.1–1.0)
Monocytes Relative: 9 %
Neutro Abs: 6.7 K/uL (ref 1.7–7.7)
Neutrophils Relative %: 62 %
Platelet Count: 419 K/uL — ABNORMAL HIGH (ref 150–400)
RBC: 4.35 MIL/uL (ref 3.87–5.11)
RDW: 15.9 % — ABNORMAL HIGH (ref 11.5–15.5)
WBC Count: 10.7 K/uL — ABNORMAL HIGH (ref 4.0–10.5)
nRBC: 0 % (ref 0.0–0.2)

## 2023-07-30 LAB — CMP (CANCER CENTER ONLY)
ALT: 16 U/L (ref 0–44)
AST: 53 U/L — ABNORMAL HIGH (ref 15–41)
Albumin: 4 g/dL (ref 3.5–5.0)
Alkaline Phosphatase: 971 U/L — ABNORMAL HIGH (ref 38–126)
Anion gap: 13 (ref 5–15)
BUN: 8 mg/dL (ref 6–20)
CO2: 30 mmol/L (ref 22–32)
Calcium: 9.9 mg/dL (ref 8.9–10.3)
Chloride: 93 mmol/L — ABNORMAL LOW (ref 98–111)
Creatinine: 0.5 mg/dL (ref 0.44–1.00)
GFR, Estimated: >60 mL/min (ref >60)
Glucose, Bld: 114 mg/dL — ABNORMAL HIGH (ref 70–99)
Potassium: 2.9 mmol/L — ABNORMAL LOW (ref 3.5–5.1)
Sodium: 137 mmol/L (ref 135–145)
Total Bilirubin: 0.4 mg/dL (ref 0.0–1.2)
Total Protein: 8.6 g/dL — ABNORMAL HIGH (ref 6.5–8.1)

## 2023-07-30 LAB — MAGNESIUM: Magnesium: 1.9 mg/dL (ref 1.7–2.4)

## 2023-07-30 LAB — CEA (ACCESS): CEA (CHCC): 124.85 ng/mL — ABNORMAL HIGH (ref 0.00–5.00)

## 2023-07-30 NOTE — Progress Notes (Signed)
 [facility] OFFICE PROGRESS NOTE   Diagnosis: Colon cancer  INTERVAL HISTORY:   April [name] returns as scheduled.  She continues to have abdominal pain.  She remains on a Duragesic  patch and takes Dilaudid  as needed.  She is followed by palliative care.  She has intermittent bowel movements.  She is taking lactulose .  She saw Dr Sheilda on 07/23/2023.  She is not eligible for a current clinical trial at [facility]  Dr. Sheilda obtained [id number] testing.  She recommends treatment with FOLFOX +/- panitumumab  depending on the guardant result.  Objective:  Vital signs in last 24 hours:  Blood pressure (!) 148/98, pulse 100, temperature 98.3 F (36.8 C), temperature source Temporal, resp. rate 16, height 5' 4 (1.626 m), weight 121 lb 14.4 oz (55.3 kg), last menstrual period 01/07/2006, SpO2 99%.    Resp: Lungs clear bilaterally Cardio: Regular rate and rhythm GI: Mildly distended, no hepatosplenomegaly, firm masslike fullness in the right mid abdomen Vascular: Leg edema    Portacath/PICC-without erythema  Lab Results:  Lab Results  Component Value Date   WBC 10.7 (H) 07/30/2023   HGB 12.0 07/30/2023   HCT 37.1 07/30/2023   MCV 85.3 07/30/2023   PLT 419 (H) 07/30/2023   NEUTROABS 6.7 07/30/2023    CMP  Lab Results  Component Value Date   NA 137 07/30/2023   K 2.9 (L) 07/30/2023   CL 93 (L) 07/30/2023   CO2 30 07/30/2023   GLUCOSE 114 (H) 07/30/2023   BUN 8 07/30/2023   CREATININE 0.50 07/30/2023   CALCIUM  9.9 07/30/2023   PROT 8.6 (H) 07/30/2023   ALBUMIN 4.0 07/30/2023   AST 53 (H) 07/30/2023   ALT 16 07/30/2023   ALKPHOS 971 (H) 07/30/2023   BILITOT 0.4 07/30/2023   GFRNONAA >60 07/30/2023   GFRAA >60 10/17/2019    Lab Results  Component Value Date   CEA1 <0.6 03/23/2022   CEA 65.90 (H) 07/10/2023   CAN199 213 (H) 03/23/2022    Lab Results  Component Value Date   INR 1.2 06/20/2023   LABPROT 15.3 (H) 06/20/2023    Imaging:  No  results found.  Medications: I have reviewed the patient's current medications.   Assessment/Plan:  Sigmoid colon cancer, stage IV (pT4a,pN2b,M1c) Colonoscopy 03/24/2019-3 rectal polyps-hyperplastic polyps, distal colon biopsy-at least intramucosal adenocarcinoma, completely obstructing mass in the distal sigmoid colon, could not be traversed, intact mismatch repair protein expression 03/24/2019-CEA 56.1 03/29/2019 CT abdomen/pelvis-circumferential thickening involving the entire mid and distal sigmoid colon to the level of the rectosigmoid junction, solid/cystic mass in the left ovary, bilateral nephrolithiasis Robotic assisted low anterior resection, mesenteric lymphadenectomy, bilateral salpingo-oophorectomy 04/08/2019 Pathology ([facility] review of outside pathology) metastatic adenocarcinoma involving the peritoneum overlying the round ligament, serosa of the urinary bladder, serosa of the right ureter a sacral area, pelvic peritoneum, and left ovary.  Omental biopsy with focal mucin pools with no carcinoma cells identified, right hemidiaphragm biopsy involved by metastatic adenocarcinoma, invasive adenocarcinoma the sigmoid colon, moderately differentiated, T4a, perineural and vascular invasion present, 7/22 lymph nodes, multiple tumor deposits, resection margins negative Negative for PD-L1, low probability of MSI-high, HER-2 negative, negative for BRAF, NRAS and KRAS alterations CTs 05/19/2019-no evidence of metastatic disease, findings suspicious for colitis of the transverse and ascending colon, small amount of ascites in the cul-de-sac, bilateral renal calculi Cycle 1 FOLFIRI 05/24/2019, bevacizumab  added with cycle 2 Cycle 4 FOLFIRI/bevacizumab  07/05/2019 Cycle 5 FOLFIRI 07/27/2019, bevacizumab  held secondary to hypertension Cycle 6 FOLFIRI 08/10/1999, bevacizumab  held secondary  to hypertension CTs 08/30/2019-no evidence of recurrent disease, new subsolid right lower lobe nodule felt to be  inflammatory, emphysema Cycle 7 FOLFIRI 09/26/2019, bevacizumab  remains on hold secondary to hypertension Cycle 8 FOLFIRI 10/17/2019, bevacizumab  held secondary to hypertension, Udenyca  added for neutropenia Cycle 9 FOLFIRI 11/07/2019, bevacizumab  held secondary to hypertension, Udenyca  Cycle 10 FOLFIRI 11/28/2019, bevacizumab  held, Udenyca  Cycle 11 FOLFIRI 12/26/2019, bevacizumab  held, Udenyca  CTs 01/25/2020-no evidence of recurrent disease, resolution of right lower lobe nodule Maintenance Xeloda  beginning 02/06/2020 CTs 04/25/2020- no evidence of metastatic disease, multiple bilateral renal calculi without hydronephrosis Maintenance Xeloda  continued CT abdomen/pelvis without contrast 08/10/2020-bilateral staghorn renal calculi, no evidence of metastatic disease; addendum 08/23/2020-small but increasing omental nodules. Cycle 1 FOLFIRI/panitumumab  09/19/2020 Cycle 2 FOLFIRI/Panitumumab  10/03/2020 Cycle 3 FOLFIRI/Panitumumab  10/17/2020 Cycle 4 FOLFIRI/panitumumab  10/31/2020 Cycle 5 FOLFIRI/Panitumumab  11/14/2020 CTs 11/26/2020-stable omental metastases compared to 10/22/2020, mildly decreased from 08/10/2020.  Left lower quadrant tiny paracolic gutter implant slightly decreased from CT 08/10/2020.  No new or progressive metastatic disease in the abdomen or pelvis. Cycle 6 FOLFIRI/Panitumumab  11/28/2020, irinotecan  dose reduced, treatment schedule adjusted to every 3 weeks going forward Cycle 7 FOLFIRI/panitumumab  12/24/2020 Cycle 8 FOLFIRI/Panitumumab  01/16/2021--treatment held due to hypertension in the infusion area. Cycle 8 FOLFIRI/panitumumab  01/21/2021 Cycle 9 FOLFIRI/panitumumab  02/11/2021 Cycle 10 FOLFIRI/Panitumumab  03/04/2021 Cycle 11 FOLFIRI/Panitumumab  03/25/2021 CT abdomen/pelvis 04/10/2021-stable right omental and left posterior paracolic gutter nodules Cycle 12 FOLFIRI/panitumumab  04/15/2021 Cycle 13 FOLFIRI/panitumumab  05/06/2021 Cycle 14 FOLFIRI/Panitumumab  05/27/2021 Cycle 15 FOLFIRI/Panitumumab   06/17/2021 CT abdomen/pelvis 07/05/2021-slight enlargement of a dominant right omental implant, no new implants Patient requested a treatment break CT abdomen/pelvis 10/04/2021-mild increase in size of multiple omental soft tissue nodules, 4 mm calculus in the distal left ureter adjacent to the ureteral stent Cycle 1 Lonsurf  10/14/2021 10/28/2021 Avastin  every 2 weeks Cycle 2 Lonsurf  11/11/2021 Cycle 3 Lonsurf  12/09/2021 Cycle 4 Lonsurf  01/06/2022 Avastin  held 01/20/2022 due to proteinuria, 24-hour urine 43 mg CT/pelvis 01/30/2022-possible new peritoneal implant at the transverse colon, no significant change in other omental/peritoneal implants, stable chronic rectal wall thickening, status post removal of double-J left ureteral stent with no evidence of hydronephrosis or ureteral calculus, nonobstructing bilateral renal calculi Cycle 5 Lonsurf  02/05/2022 or 02/06/2022 Avastin  every 2 weeks Cycle 6 Lonsurf  03/03/2022 CTs 04/05/2022-increase in size and number of peritoneal implants compared to January 2024 central liver mass narrowing the anterior branch of the right portal vein, decreased biliary duct dilation, biliary stents in place, stable right cardiophrenic lymph nodes, separate pigtail stent in the lumen of the proximal duodenum Cycle 1 FOLFOX 04/14/2022 CT abdomen/pelvis 04/16/2022-stent within the third portion of the duodenum, stable peritoneal nodules, indwelling biliary stents with mild left biliary duct dilation Cycle 2 FOLFOX 04/28/2022, Emend and prophylactic dexamethasone  added Cycle 3 FOLFOX 05/12/2022, Aloxi , Emend, prophylactic dexamethasone , Compazine  and lorazepam  as needed Chemotherapy held 05/25/2020 due to severe hypertension, evaluated in the emergency department Cycle 4 FOLFOX 06/03/2022 Cycle 5 FOLFOX 06/16/2022, oxaliplatin  dose reduced and 5-FU bolus eliminated due to neutropenia CT 06/25/2022-unchanged soft tissue at the hepatic hilum, unchanged, bile duct stent, decreased size of  peritoneal and omental nodules and diminished volume of likely fluid in the lower left pelvis Cycle 6 FOLFOX 06/30/2022 Cycle 7 FOLFOX 07/21/2022 CT 07/24/2022 (in the emergency department with nausea, headache, abdominal pain)-no acute pathology.  No significant interval change in omental nodularity. Cycle 8 FOLFOX 08/04/2022, pruritus on the palms at the end of the oxaliplatin  infusion, Pepcid  and Benadryl  given, symptoms resolved, infusion not resumed Cycle 9 FOLFOX 08/18/2022-Pepcid  and Benadryl  added to premedications, Oxaliplatin   diluted in a larger volume and infusion time increased Cycle 10 FOLFOX 09/08/2022 Cycle 11 FOLFOX 09/22/2022 10/06/2022 patient declined treatment CTs 10/15/2022-similar soft tissue fullness in the hepatic hilum with biliary stents in place, decrease in omental/peritoneal metastases Maintenance capecitabine  10/26/2022 CT abdomen/pelvis 11/19/2022-mild increase in tumor studding at the left paracolic gutter and omentum, circumferential wall thickening of the rectum, biliary stents in place with minimal increase in intrahepatic biliary dilation, nonobstructive renal calculi Colonoscopy 11/23/2022-anastomosis at 15 cm from the anal verge-patent, 2 cm polypoid tissue at the anastomosis biopsied, invasive adenocarcinoma moderately differentiated, preserved expression of major MMR proteins CTs 12/15/2022: Possible developing nodule in the lateral left liver, increased omental nodularity, persistent wall thickening in the upper rectum CT abdomen/pelvis 12/24/2022: Increased thickened fold in the transverse colon-infectious/inflammatory, 2.1 cm lateral left liver lesion, potential new 5 mm segment 5 lesion, stable biliary stents with intrahepatic biliary dilation, central biliary wall thickening near the hepatic hilum, multiple peritoneal/omental implants unchanged from 12/15/2022, but likely larger than 11/19/2022 Cycle 1 irinotecan /Panitumumab  01/06/2023 Cycle 2 irinotecan /Panitumumab   01/20/2023 CTs 01/24/2023-slight increase in omental nodularity. Cycle 3 irinotecan /Panitumumab  02/03/2023 Cycle 4 irinotecan /panitumumab  02/17/2023 CTs abdomen/pelvis 02/24/2023-subtly hypoattenuating lesion within the peripheral aspect of hepatic segment 2 redemonstrated.  Additional scattered subcentimeter low-density lesions within the liver too small to characterize.  Prominent intrahepatic biliary dilatation with biliary stents in place.  Similar degree of nodularity along the omentum and left paracolic gutter.  No ascites. CTs 03/09/2023-indwelling endoscopically placed stents; level of intrahepatic biliary duct dilatation is slightly improved on the left and some on the right.  Gallbladder is dilated with wall edema which is increasing.  More nodular tissue extending up along the porta hepatis.  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  Rectum with nodular wall thickening with extension into the adjacent tissue, nodular areas seen extending perirectal and presacral spaces.  Peritoneal carcinomatosis again identified. Irinotecan /Panitumumab  04/02/2023 Irinotecan /panitumumab  04/16/2023 Irinotecan /Panitumumab  05/07/2023 Irinotecan /Panitumumab  06/19/2023 CT abdomen/pelvis 06/21/2023-stable heterogeneous enhancement throughout the liver with nodularity along the surface of the liver.  Small amount of perihepatic ascites.  Stable nodularity right abdomen.  Circumferential rectal wall thickening/mass unchanged. Irinotecan /Panitumumab  discontinued 07/10/2023 due to lack of clinical benefit/poor tolerance Appointment with Dr. Sheilda 07/23/2023   Hypertension G4 P3, twins Kidney stones-bilateral staghorn renal calculi on CT 08/10/2020 Infected Port-A-Cath 09/12/2019-placed on Augmentin , referred for Port-A-Cath removal; Port-A-Cath removed 09/14/2019; PICC line placed 09/14/2019; culture staph aureus.  Course of Septra  completed. Neutropenia secondary to chemotherapy-Udenyca  added with cycle 8 FOLFIRI Right  nephrostomy tube 09/12/2020; stent placed 10/09/2020, percutaneous nephrostolithotomy treatment of right-sided kidney stones 10/09/2020, malpositioned right ureter stent replacement 10/23/2020, right ureter stent removed 10/29/2020 Percutaneous left nephrostolithotomy 10/01/2021-no renal stone identified, left ureter stent left in place Cystoscopy 10/14/2021-stent removed 8.  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  2 moderate biliary strictures found in the hepatic duct system, malignant appearing.  Left and right hepatic duct and all intrahepatic branches were moderately dilated secondary to the strictures.  The strictures were dilated.  Plastic stent placed into the left  hepatic duct and right hepatic duct. 9.  Admission 04/16/2022 with nausea/vomiting and abdominal pain, felt to be acute toxicity related to chemotherapy, discharged home 04/18/2022 10.  Admission 11/19/2022 with nausea/vomiting and abdominal pain-improved 11.  Admission 12/15/2022 with fever and right submandibular swelling/tenderness CT neck 12/15/2022: 2 mm distal right submandibular duct calculus with mild inflammatory change adjacent to the right submandibular gland 12.  Admission 12/24/2022 with a fever nausea/vomiting, diarrhea, Klebsiella bacteremia completed course of Augmentin  13.  Admission with abdominal pain and nausea/vomiting 03/09/2023 through  03/14/2023-concern for gallbladder inflammation.  Not felt to be a surgical candidate.  Percutaneous cholecystostomy tube placed 03/11/2023.  Tube removed 04/24/2023. 14.  Admission 06/20/2023 with abdominal pain and constipation, pain improved partially following a bowel movement   Disposition: April [name] has metastatic colon cancer.  She appears unchanged.  She is symptomatic with pain and obstructive symptoms from carcinomatosis.  She is currently maintained off of systemic therapy.  I discussed treatment options with April [name].  Dr. Sheilda recommends recycled FOLFOX +/- panitumumab .  April [name] indicated she does not wish to be treated with a 5-FU infusion.  She we will consider treatment with capecitabine /oxaliplatin , though she feels capecitabine  caused nausea in the past.  She last received oxaliplatin  in September 2024.  She had an allergic reaction with hand pruritus during and oxaliplatin  infusion.  She was able to complete 2 more cycles of oxaliplatin  with additional premedications and an increased oxaliplatin  infusion time.  I will contact Dr. Sheilda for the [place] results and to discuss treatment options.  April [name] will return for an office visit and further discussion next week.  Arley Hof, MD  07/30/2023  11:09 AM

## 2023-07-31 ENCOUNTER — Encounter

## 2023-07-31 ENCOUNTER — Telehealth: Payer: Self-pay | Admitting: *Deleted

## 2023-07-31 NOTE — Telephone Encounter (Signed)
 Error

## 2023-07-31 NOTE — Telephone Encounter (Signed)
 Informed patient of very low K+ results and she needs to taker her KCl 20 meq bid (has only been taking 10-20 meq/day. She verbalized frustration with her abdominal pain and thinks someone is missing something. She is in pain and is going to the emergency room. MD will be notified.

## 2023-08-01 ENCOUNTER — Other Ambulatory Visit (HOSPITAL_COMMUNITY): Payer: Self-pay

## 2023-08-02 ENCOUNTER — Other Ambulatory Visit: Payer: Self-pay

## 2023-08-02 ENCOUNTER — Inpatient Hospital Stay (HOSPITAL_COMMUNITY)
Admission: EM | Admit: 2023-08-02 | Discharge: 2023-08-12 | DRG: 947 | Disposition: A | Discharge status: Home / Self Care | Attending: Internal Medicine | Admitting: Internal Medicine

## 2023-08-02 ENCOUNTER — Encounter (HOSPITAL_COMMUNITY): Payer: Self-pay | Admitting: Emergency Medicine

## 2023-08-02 ENCOUNTER — Emergency Department (HOSPITAL_COMMUNITY)

## 2023-08-02 DIAGNOSIS — Z90722 Acquired absence of ovaries, bilateral: Secondary | ICD-10-CM

## 2023-08-02 DIAGNOSIS — I7121 Aneurysm of the ascending aorta, without rupture: Secondary | ICD-10-CM | POA: Diagnosis present

## 2023-08-02 DIAGNOSIS — K831 Obstruction of bile duct: Secondary | ICD-10-CM | POA: Diagnosis present

## 2023-08-02 DIAGNOSIS — D75839 Thrombocytosis, unspecified: Secondary | ICD-10-CM | POA: Diagnosis present

## 2023-08-02 DIAGNOSIS — G893 Neoplasm related pain (acute) (chronic): Secondary | ICD-10-CM | POA: Diagnosis not present

## 2023-08-02 DIAGNOSIS — Z79899 Other long term (current) drug therapy: Secondary | ICD-10-CM

## 2023-08-02 DIAGNOSIS — E876 Hypokalemia: Secondary | ICD-10-CM | POA: Diagnosis present

## 2023-08-02 DIAGNOSIS — K5900 Constipation, unspecified: Secondary | ICD-10-CM | POA: Diagnosis present

## 2023-08-02 DIAGNOSIS — D630 Anemia in neoplastic disease: Secondary | ICD-10-CM | POA: Diagnosis present

## 2023-08-02 DIAGNOSIS — Z888 Allergy status to other drugs, medicaments and biological substances status: Secondary | ICD-10-CM

## 2023-08-02 DIAGNOSIS — C189 Malignant neoplasm of colon, unspecified: Secondary | ICD-10-CM | POA: Diagnosis present

## 2023-08-02 DIAGNOSIS — C786 Secondary malignant neoplasm of retroperitoneum and peritoneum: Secondary | ICD-10-CM | POA: Diagnosis present

## 2023-08-02 DIAGNOSIS — R188 Other ascites: Secondary | ICD-10-CM | POA: Diagnosis present

## 2023-08-02 DIAGNOSIS — N200 Calculus of kidney: Secondary | ICD-10-CM | POA: Diagnosis present

## 2023-08-02 DIAGNOSIS — C787 Secondary malignant neoplasm of liver and intrahepatic bile duct: Secondary | ICD-10-CM | POA: Diagnosis present

## 2023-08-02 DIAGNOSIS — C772 Secondary and unspecified malignant neoplasm of intra-abdominal lymph nodes: Secondary | ICD-10-CM

## 2023-08-02 DIAGNOSIS — Z515 Encounter for palliative care: Secondary | ICD-10-CM

## 2023-08-02 DIAGNOSIS — K644 Residual hemorrhoidal skin tags: Secondary | ICD-10-CM | POA: Diagnosis present

## 2023-08-02 DIAGNOSIS — D72829 Elevated white blood cell count, unspecified: Secondary | ICD-10-CM | POA: Diagnosis present

## 2023-08-02 DIAGNOSIS — I10 Essential (primary) hypertension: Secondary | ICD-10-CM | POA: Diagnosis present

## 2023-08-02 DIAGNOSIS — I160 Hypertensive urgency: Secondary | ICD-10-CM | POA: Diagnosis present

## 2023-08-02 DIAGNOSIS — C182 Malignant neoplasm of ascending colon: Secondary | ICD-10-CM

## 2023-08-02 DIAGNOSIS — C19 Malignant neoplasm of rectosigmoid junction: Secondary | ICD-10-CM | POA: Diagnosis present

## 2023-08-02 DIAGNOSIS — Z7189 Other specified counseling: Secondary | ICD-10-CM

## 2023-08-02 DIAGNOSIS — R110 Nausea: Secondary | ICD-10-CM | POA: Diagnosis not present

## 2023-08-02 DIAGNOSIS — Z9079 Acquired absence of other genital organ(s): Secondary | ICD-10-CM

## 2023-08-02 DIAGNOSIS — R52 Pain, unspecified: Principal | ICD-10-CM | POA: Diagnosis present

## 2023-08-02 LAB — CBC WITH DIFFERENTIAL/PLATELET
Abs Immature Granulocytes: 0.04 K/uL (ref 0.00–0.07)
Basophils Absolute: 0.1 K/uL (ref 0.0–0.1)
Basophils Relative: 1 %
Eosinophils Absolute: 0.5 K/uL (ref 0.0–0.5)
Eosinophils Relative: 4 %
HCT: 33.3 % — ABNORMAL LOW (ref 36.0–46.0)
Hemoglobin: 10.4 g/dL — ABNORMAL LOW (ref 12.0–15.0)
Immature Granulocytes: 0 %
Lymphocytes Relative: 24 %
Lymphs Abs: 3 K/uL (ref 0.7–4.0)
MCH: 27.4 pg (ref 26.0–34.0)
MCHC: 31.2 g/dL (ref 30.0–36.0)
MCV: 87.9 fL (ref 80.0–100.0)
Monocytes Absolute: 0.8 K/uL (ref 0.1–1.0)
Monocytes Relative: 7 %
Neutro Abs: 8.1 K/uL — ABNORMAL HIGH (ref 1.7–7.7)
Neutrophils Relative %: 64 %
Platelets: 415 K/uL — ABNORMAL HIGH (ref 150–400)
RBC: 3.79 MIL/uL — ABNORMAL LOW (ref 3.87–5.11)
RDW: 16.8 % — ABNORMAL HIGH (ref 11.5–15.5)
WBC: 12.6 K/uL — ABNORMAL HIGH (ref 4.0–10.5)
nRBC: 0 % (ref 0.0–0.2)

## 2023-08-02 LAB — I-STAT CHEM 8, ED
BUN: 8 mg/dL (ref 6–20)
Calcium, Ion: 1.08 mmol/L — ABNORMAL LOW (ref 1.15–1.40)
Chloride: 96 mmol/L — ABNORMAL LOW (ref 98–111)
Creatinine, Ser: 0.8 mg/dL (ref 0.44–1.00)
Glucose, Bld: 104 mg/dL — ABNORMAL HIGH (ref 70–99)
HCT: 33 % — ABNORMAL LOW (ref 36.0–46.0)
Hemoglobin: 11.2 g/dL — ABNORMAL LOW (ref 12.0–15.0)
Potassium: 4.3 mmol/L (ref 3.5–5.1)
Sodium: 137 mmol/L (ref 135–145)
TCO2: 36 mmol/L — ABNORMAL HIGH (ref 22–32)

## 2023-08-02 LAB — URINALYSIS, ROUTINE W REFLEX MICROSCOPIC
Bacteria, UA: NONE SEEN
Bilirubin Urine: NEGATIVE
Glucose, UA: NEGATIVE mg/dL
Ketones, ur: NEGATIVE mg/dL
Leukocytes,Ua: NEGATIVE
Nitrite: NEGATIVE
Protein, ur: 30 mg/dL — AB
RBC / HPF: >50 RBC/hpf (ref 0–5)
Specific Gravity, Urine: 1.036 — ABNORMAL HIGH (ref 1.005–1.030)
pH: 7 (ref 5.0–8.0)

## 2023-08-02 LAB — COMPREHENSIVE METABOLIC PANEL WITH GFR
ALT: 16 U/L (ref 0–44)
AST: 48 U/L — ABNORMAL HIGH (ref 15–41)
Albumin: 3 g/dL — ABNORMAL LOW (ref 3.5–5.0)
Alkaline Phosphatase: 661 U/L — ABNORMAL HIGH (ref 38–126)
Anion gap: 11 (ref 5–15)
BUN: 8 mg/dL (ref 6–20)
CO2: 31 mmol/L (ref 22–32)
Calcium: 9 mg/dL (ref 8.9–10.3)
Chloride: 95 mmol/L — ABNORMAL LOW (ref 98–111)
Creatinine, Ser: 0.72 mg/dL (ref 0.44–1.00)
GFR, Estimated: >60 mL/min (ref >60)
Glucose, Bld: 103 mg/dL — ABNORMAL HIGH (ref 70–99)
Potassium: 3.8 mmol/L (ref 3.5–5.1)
Sodium: 137 mmol/L (ref 135–145)
Total Bilirubin: 0.6 mg/dL (ref 0.0–1.2)
Total Protein: 7.7 g/dL (ref 6.5–8.1)

## 2023-08-02 LAB — LACTIC ACID, PLASMA: Lactic Acid, Venous: 1.2 mmol/L (ref 0.5–1.9)

## 2023-08-02 LAB — LIPASE, BLOOD: Lipase: 35 U/L (ref 11–51)

## 2023-08-02 MED ORDER — ACETAMINOPHEN 650 MG RE SUPP: 650.0000 mg | Freq: Four times a day (QID) | RECTAL | Status: DC | PRN

## 2023-08-02 MED ORDER — HYDROMORPHONE HCL 1 MG/ML IJ SOLN
1.0000 mg | Freq: Once | INTRAMUSCULAR | Status: AC
  Administered 2023-08-02: 1 mg via INTRAVENOUS
  Filled 2023-08-02: qty 1

## 2023-08-02 MED ORDER — HYDROMORPHONE HCL 1 MG/ML IJ SOLN
2.0000 mg | Freq: Once | INTRAMUSCULAR | Status: AC
  Administered 2023-08-02: 2 mg via INTRAVENOUS
  Filled 2023-08-02: qty 2

## 2023-08-02 MED ORDER — OXYCODONE HCL ER 20 MG PO T12A
20.0000 mg | EXTENDED_RELEASE_TABLET | Freq: Three times a day (TID) | ORAL | Status: DC
  Administered 2023-08-03 – 2023-08-07 (×11): 20 mg via ORAL
  Filled 2023-08-02 (×13): qty 1

## 2023-08-02 MED ORDER — METOPROLOL TARTRATE 50 MG PO TABS
100.0000 mg | ORAL_TABLET | Freq: Two times a day (BID) | ORAL | Status: DC
  Administered 2023-08-03 – 2023-08-12 (×19): 100 mg via ORAL
  Filled 2023-08-02 (×20): qty 2

## 2023-08-02 MED ORDER — POTASSIUM CHLORIDE 10 MEQ/100ML IV SOLN: 10.0000 meq | INTRAVENOUS | Status: DC

## 2023-08-02 MED ORDER — POTASSIUM CHLORIDE CRYS ER 20 MEQ PO TBCR: 40.0000 meq | EXTENDED_RELEASE_TABLET | Freq: Once | ORAL | Status: DC

## 2023-08-02 MED ORDER — LABETALOL HCL 5 MG/ML IV SOLN
10.0000 mg | Freq: Once | INTRAVENOUS | Status: AC
  Administered 2023-08-02: 10 mg via INTRAVENOUS
  Filled 2023-08-02: qty 4

## 2023-08-02 MED ORDER — ACETAMINOPHEN 325 MG PO TABS
650.0000 mg | ORAL_TABLET | Freq: Four times a day (QID) | ORAL | Status: DC | PRN
  Administered 2023-08-10 – 2023-08-12 (×2): 650 mg via ORAL
  Filled 2023-08-02 (×2): qty 2

## 2023-08-02 MED ORDER — AMLODIPINE BESYLATE 10 MG PO TABS
10.0000 mg | ORAL_TABLET | Freq: Every day | ORAL | Status: DC
  Administered 2023-08-03 – 2023-08-12 (×9): 10 mg via ORAL
  Filled 2023-08-02 (×10): qty 1

## 2023-08-02 MED ORDER — SENNOSIDES-DOCUSATE SODIUM 8.6-50 MG PO TABS
1.0000 | ORAL_TABLET | Freq: Two times a day (BID) | ORAL | Status: DC
  Filled 2023-08-02: qty 1

## 2023-08-02 MED ORDER — HYDROMORPHONE HCL 1 MG/ML IJ SOLN
2.0000 mg | INTRAMUSCULAR | Status: DC | PRN
  Administered 2023-08-03 – 2023-08-07 (×18): 2 mg via INTRAVENOUS
  Filled 2023-08-02 (×20): qty 2

## 2023-08-02 MED ORDER — POLYETHYLENE GLYCOL 3350 17 G PO PACK
17.0000 g | PACK | Freq: Two times a day (BID) | ORAL | Status: DC
  Administered 2023-08-03 – 2023-08-12 (×13): 17 g via ORAL
  Filled 2023-08-02 (×17): qty 1

## 2023-08-02 MED ORDER — LACTATED RINGERS IV BOLUS
1000.0000 mL | Freq: Once | INTRAVENOUS | Status: AC
  Administered 2023-08-02: 1000 mL via INTRAVENOUS

## 2023-08-02 MED ORDER — ENOXAPARIN SODIUM 40 MG/0.4ML IJ SOSY
40.0000 mg | PREFILLED_SYRINGE | INTRAMUSCULAR | Status: DC
  Filled 2023-08-02 (×4): qty 0.4

## 2023-08-02 MED ORDER — HYDRALAZINE HCL 20 MG/ML IJ SOLN
10.0000 mg | INTRAMUSCULAR | Status: DC | PRN
  Administered 2023-08-03: 10 mg via INTRAVENOUS
  Filled 2023-08-02 (×2): qty 1

## 2023-08-02 MED ORDER — SODIUM CHLORIDE 0.9 % IV SOLN: INTRAVENOUS | Status: DC

## 2023-08-02 MED ORDER — IOHEXOL 300 MG/ML  SOLN
100.0000 mL | Freq: Once | INTRAMUSCULAR | Status: AC | PRN
  Administered 2023-08-02: 100 mL via INTRAVENOUS

## 2023-08-02 MED ORDER — MAGNESIUM HYDROXIDE 400 MG/5ML PO SUSP
30.0000 mL | Freq: Every day | ORAL | Status: DC | PRN
Start: 2023-08-02 — End: 2023-08-08
  Administered 2023-08-03: 30 mL via ORAL
  Filled 2023-08-02 (×2): qty 30

## 2023-08-02 MED ORDER — SODIUM CHLORIDE 0.9% FLUSH
3.0000 mL | Freq: Two times a day (BID) | INTRAVENOUS | Status: DC
  Administered 2023-08-03 – 2023-08-11 (×14): 3 mL via INTRAVENOUS

## 2023-08-02 MED ORDER — SODIUM CHLORIDE 0.9 % IV SOLN
12.5000 mg | Freq: Four times a day (QID) | INTRAVENOUS | Status: DC | PRN
  Administered 2023-08-02 – 2023-08-12 (×12): 12.5 mg via INTRAVENOUS
  Filled 2023-08-02 (×3): qty 12.5
  Filled 2023-08-02: qty 0.5
  Filled 2023-08-02 (×8): qty 12.5

## 2023-08-02 MED ORDER — SMOG ENEMA: 960.0000 mL | Freq: Once | RECTAL | Status: DC | PRN

## 2023-08-02 MED ORDER — HYDROMORPHONE HCL 2 MG PO TABS
4.0000 mg | ORAL_TABLET | ORAL | Status: DC | PRN
  Administered 2023-08-03: 8 mg via ORAL
  Filled 2023-08-02: qty 4

## 2023-08-02 MED ORDER — DICYCLOMINE HCL 20 MG PO TABS
20.0000 mg | ORAL_TABLET | Freq: Three times a day (TID) | ORAL | Status: DC | PRN
  Administered 2023-08-10 – 2023-08-12 (×3): 20 mg via ORAL
  Filled 2023-08-02 (×5): qty 1

## 2023-08-02 MED ORDER — CLONIDINE HCL 0.1 MG PO TABS
0.3000 mg | ORAL_TABLET | Freq: Three times a day (TID) | ORAL | Status: DC
  Administered 2023-08-03 – 2023-08-12 (×27): 0.3 mg via ORAL
  Filled 2023-08-02 (×29): qty 3

## 2023-08-02 MED ORDER — BISACODYL 10 MG RE SUPP
10.0000 mg | Freq: Every day | RECTAL | Status: DC | PRN
  Filled 2023-08-02: qty 1

## 2023-08-02 NOTE — ED Notes (Signed)
 Pt reports she is supposed to be on BP meds but has not taken them in over a week due to pain

## 2023-08-02 NOTE — ED Notes (Signed)
 Per EMS BP en route was 140/palp

## 2023-08-02 NOTE — H&P (Signed)
 History and Physical    Sallye Lunz FMW:[id number] DOB: April 13, 1975 DOA: 08/02/2023  PCP: Oris Camie BRAVO, NP  Patient coming from: Home  I have personally briefly reviewed patient's old medical records in [facility]  Chief Complaint: Worsening abdominal pain  HPI: April [name] is a 48 y.o. 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  She was admitted last month for similar uncontrolled pain.  She was seen by palliative care in hospital and managed with PCA pump for short period of time before transition to oral and intermittent IV narcotics.  Patient has been taking Xtampza  18 mg every 12 hours scheduled, oral Dilaudid  4-8 mg every 4 hours as needed.  She has not had adequate pain control on this regimen.  She also reports she has not had a bowel movement for at least 1 week.  This is despite taking lactulose , Colace, MiraLAX , Senokot.  ED Course  Labs/Imaging on admission: I have personally reviewed following labs and imaging studies.  Initial vitals showed BP 180/137, pulse 115, RR 19, temp 99.0 F, SpO2 98% on room air.  Labs showed sodium 137, potassium 3.8, bicarb 31, BUN 8, creatinine 0.72, serum glucose 103, AST 48, ALT 16, alk phos 661, total bilirubin 0.6, lipase 35, WBC 12.6, hemoglobin 10.4, platelets 415, lactic acid 1.2.  UA showed negative nitrites, negative leukocytes, >50 RBCs, 0-5 WBCs, no bacteria.  Blood cultures in process.  CT abdomen/pelvis with contrast showed mild rectal soft tissue mass, mildly progressive.  Right hepatic metastases, progressive.  Peritoneal  disease/omental caking, stable versus mildly progressive.  Small volume abdominal pelvic ascites, mildly progressive.  Patient was given IV Dilaudid  1 mg x 2, 1 L LR.  The hospitalist service was consulted to admit.  Review of Systems: All systems reviewed and are negative except as documented in history of present illness above.   Past Medical History:  Diagnosis Date   Ascending aortic aneurysm (HCC) 12/16/2022   Benign essential hypertension 10/06/2017   Last Assessment & Plan:  Formatting of this note might be different from the original. Patient's blood pressure noted to be elevated at today's visit 188/98.  Patient will be provided with antihypertensives in the treatment center if needed in order to meet her Avastin  parameters.  Patient recommended to follow up with her primary care physician regarding medication management.  Patient is not cur   Cancer [facility])    Change in bowel habits 06/28/2022   Colon cancer (HCC)    History of colon cancer 06/28/2022   Hypertension    Prolonged QT interval 01/07/2018   Renal calculi    bilateral   Sepsis due to undetermined organism (HCC) 12/24/2022   Sialoadenitis of submandibular gland 12/16/2022    Past Surgical History:  Procedure Laterality Date   BALLOON DILATION N/A 02/26/2023   Procedure: BALLOON DILATION;  Surgeon: Wilhelmenia Aloha Raddle., MD;  Location: [facility];  Service: Gastroenterology;  Laterality: N/A;   BILIARY BRUSHING  03/24/2022   Procedure: BILIARY BRUSHING;  Surgeon: Wilhelmenia Aloha Raddle., MD;  Location: [facility] ENDOSCOPY;  Service: Gastroenterology;;   BILIARY DILATION  03/24/2022   Procedure: BILIARY DILATION;  Surgeon: Wilhelmenia Aloha Raddle., MD;  Location: [facility] ENDOSCOPY;  Service:  Gastroenterology;;   BILIARY STENT PLACEMENT  03/24/2022   Procedure: BILIARY STENT PLACEMENT;  Surgeon: Wilhelmenia Aloha Raddle., MD;  Location: [facility] ENDOSCOPY;  Service: Gastroenterology;;   BILIARY STENT PLACEMENT N/A 02/26/2023   Procedure:  BILIARY STENT PLACEMENT;  Surgeon: Wilhelmenia Aloha Raddle., MD;  Location: [facility];  Service: Gastroenterology;  Laterality: N/A;   BIOPSY  03/24/2022   Procedure: BIOPSY;  Surgeon: Wilhelmenia Aloha Raddle., MD;  Location: [facility] ENDOSCOPY;  Service: Gastroenterology;;   BIOPSY  11/23/2022   Procedure: BIOPSY;  Surgeon: Abran Norleen SAILOR, MD;  Location: [facility];  Service: Gastroenterology;;   BIOPSY  02/26/2023   Procedure: BIOPSY;  Surgeon: Wilhelmenia Aloha Raddle., MD;  Location: [facility];  Service: Gastroenterology;;   CESAREAN SECTION     x2   COLONOSCOPY N/A 11/23/2022   Procedure: COLONOSCOPY;  Surgeon: Abran Norleen SAILOR, MD;  Location: [facility];  Service: Gastroenterology;  Laterality: N/A;   CYSTOSCOPY WITH RETROGRADE PYELOGRAM, URETEROSCOPY AND STENT PLACEMENT Right 10/22/2020   Procedure: CYSTOSCOPY WITH RETROGRADE PYELOGRAM, URETEROSCOPY AND STENT PLACEMENT;  Surgeon: Elisabeth Valli BIRCH, MD;  Location: [facility];  Service: Urology;  Laterality: Right;   ENDOSCOPIC RETROGRADE CHOLANGIOPANCREATOGRAPHY (ERCP) WITH PROPOFOL  N/A 02/26/2023   Procedure: ENDOSCOPIC RETROGRADE CHOLANGIOPANCREATOGRAPHY (ERCP) WITH PROPOFOL ;  Surgeon: Wilhelmenia Aloha Raddle., MD;  Location: [facility];  Service: Gastroenterology;  Laterality: N/A;   ERCP N/A 03/24/2022   Procedure: ENDOSCOPIC RETROGRADE CHOLANGIOPANCREATOGRAPHY (ERCP);  Surgeon: Wilhelmenia Aloha Raddle., MD;  Location: [facility] ENDOSCOPY;  Service: Gastroenterology;  Laterality: N/A;   ESOPHAGOGASTRODUODENOSCOPY (EGD) WITH PROPOFOL  N/A 02/26/2023   Procedure: ESOPHAGOGASTRODUODENOSCOPY (EGD) WITH PROPOFOL ;  Surgeon: Wilhelmenia Aloha Raddle., MD;  Location: [facility];  Service: Gastroenterology;  Laterality: N/A;  patient needs EGD in addition to ERCP   IR PATIENT EVAL TECH 0-60 MINS  12/15/2022   IR PERC CHOLECYSTOSTOMY  03/11/2023   IR RADIOLOGIST EVAL & MGMT  09/16/2019   IR RADIOLOGIST EVAL & MGMT  09/23/2019   IR RADIOLOGIST EVAL & MGMT  09/20/2019    IR RADIOLOGIST EVAL & MGMT  09/30/2019   IR RADIOLOGIST EVAL & MGMT  04/20/2023   IR REMOVAL TUN ACCESS W/ PORT W/O FL MOD SED  09/14/2019   IR VENO/EXT/UNI RIGHT  04/11/2022   PANCREATIC STENT PLACEMENT  03/24/2022   Procedure: PANCREATIC STENT PLACEMENT;  Surgeon: Wilhelmenia Aloha Raddle., MD;  Location: [facility]OSCOPY;  Service: Gastroenterology;;   REMOVAL OF STONES  03/24/2022   Procedure: REMOVAL OF STONES;  Surgeon: Wilhelmenia Aloha Raddle., MD;  Location: [facility] ENDOSCOPY;  Service: Gastroenterology;;   REMOVAL OF STONES  02/26/2023   Procedure: REMOVAL OF Sludge;  Surgeon: Wilhelmenia Aloha Raddle., MD;  Location: [facility];  Service: Gastroenterology;;   ANNETT  03/24/2022   Procedure: ANNETT;  Surgeon: Mansouraty, Aloha Raddle., MD;  Location: [facility] ENDOSCOPY;  Service: Gastroenterology;;   CLEDA REMOVAL  02/26/2023   Procedure: STENT REMOVAL;  Surgeon: Wilhelmenia Aloha Raddle., MD;  Location: [facility];  Service: Gastroenterology;;   URETERAL STENT PLACEMENT      Social History: Social History   Tobacco Use   Smoking status: Former    Types: Cigarettes   Smokeless tobacco: Never   Tobacco comments:    Quit 1 month ago   Vaping Use   Vaping status: Never Used  Substance Use Topics   Alcohol use: Not Currently   Drug use: Not Currently    Types: Marijuana   Allergies  Allergen Reactions   Lisinopril Swelling and Other (See Comments)    Lip swelling  and Angioedema   Irinotecan  Other (See Comments)    Stomach cramping Patient given 0.5 mL atropine  IV and was able to continue infusion.  See Progress note on 10/03/20.    Leucovorin  Calcium  Itching and Other (See Comments)    Patient reported itchy palms towards end of transfusion, see progress note 08/04/22.    Oxaliplatin  Itching and Other (See Comments)    Patient reported itchy palms towards end of transfusion, see progress note on 08/04/22.    Family History  Problem Relation Age of Onset   Hypertension Mother     Diabetes Mellitus II Father    Stroke Father    Ulcers Sister    Colon cancer Neg Hx    Esophageal cancer Neg Hx    Inflammatory bowel disease Neg Hx    Liver disease Neg Hx    Pancreatic cancer Neg Hx    Rectal cancer Neg Hx    Stomach cancer Neg Hx      Prior to Admission medications   Medication Sig Start Date End Date Taking? Authorizing Provider  alum & mag hydroxide-simeth (MAALOX/MYLANTA) 200-200-20 MG/5ML suspension Take 30 mLs by mouth every 6 (six) hours as needed for indigestion or heartburn. Patient not taking: Reported on 07/30/2023 06/26/23   Pokhrel, Vernal, MD  amLODipine  (NORVASC ) 10 MG tablet Take 1 tablet (10 mg total) by mouth daily. 06/26/23   Pokhrel, Laxman, MD  cloNIDine  (CATAPRES ) 0.1 MG tablet Take 3 tablets (0.3 mg total) by mouth 3 (three) times daily. 06/09/23   Shahmehdi, Adriana LABOR, MD  dexamethasone  (DECADRON ) 4 MG tablet Take 4 mg by mouth See admin instructions. Take 4 mg by mouth twice a day for 3 days beginning on day 2 of chemo. Patient not taking: Reported on 07/30/2023    [provider]  dicyclomine  (BENTYL ) 20 MG tablet Take 1 tablet (20 mg total) by mouth 3 (three) times daily as needed for spasms (rectal/AB pain). 02/17/23   Cloretta Arley NOVAK, MD  docusate sodium  (COLACE) 100 MG capsule Take 1 capsule (100 mg total) by mouth 2 (two) times daily. Patient not taking: Reported on 07/30/2023 06/19/23   Debby Olam POUR, NP  HYDROmorphone  (DILAUDID ) 4 MG tablet Take 1-2 tablets (4-8 mg total) by mouth every 4 (four) hours as needed for moderate pain (pain score 4-6). 07/28/23   Pickenpack-Cousar, Fannie SAILOR, NP  lactulose  (CHRONULAC ) 10 GM/15ML solution Take 15-30 mLs (10-20 g total) by mouth daily as needed for mild constipation. 07/06/23   Pickenpack-Cousar, Athena N, NP  metoprolol  tartrate (LOPRESSOR ) 100 MG tablet Take 1 tablet (100 mg total) by mouth 2 (two) times daily. 06/09/23 07/30/23  Willette Adriana LABOR, MD  oxyCODONE  ER (XTAMPZA  ER) 18 MG C12A Take 1  tablet by mouth every 12 (twelve) hours. 07/28/23   Pickenpack-Cousar, Fannie SAILOR, NP  pantoprazole  (PROTONIX ) 40 MG tablet Take 1 tablet (40 mg total) by mouth daily before breakfast. 02/27/23   Drusilla Sabas RAMAN, MD  polyethylene glycol powder (GLYCOLAX /MIRALAX ) 17 GM/SCOOP powder Take 17 g by mouth daily as needed. Patient not taking: Reported on 07/30/2023 06/09/23   Willette Adriana LABOR, MD  potassium chloride  (MICRO-K ) 10 MEQ CR capsule Take 2 capsules (20 mEq total) by mouth 2 (two) times daily. 06/11/23   Burton, Lacie K, NP  prochlorperazine  (COMPAZINE ) 10 MG tablet Take 1 tablet (10 mg total) by mouth every 6 (six) hours as needed for nausea or vomiting. Patient not taking: Reported on 07/30/2023 07/06/23   Pickenpack-Cousar, Athena N, NP  senna (SENOKOT) 8.6 MG TABS tablet Take 2 tablets (17.2 mg total) by mouth at bedtime. Patient not taking: Reported on 07/30/2023 06/26/23   Sonjia Held, MD  Sodium Chloride  Flush (NORMAL SALINE FLUSH) 0.9 % SOLN Use 5 mLs by Intracatheter route daily in biliary drain at 6 (six) AM as directed 03/14/23   [place], Eric J, [place]    Physical Exam: Vitals:   08/02/23 2245 08/02/23 2300 08/02/23 2315 08/02/23 2330  BP: (!) 183/134 (!) 193/138 (!) 202/134 (!) 197/130  Pulse: 97 (!) 106 91 92  Resp:  15 15   Temp:      SpO2: 99% 99% 99% 96%  Weight:      Height:       Constitutional: Resting in bed, comfortable Eyes: EOMI, lids and conjunctivae normal ENMT: Mucous membranes are moist. Posterior pharynx clear of any exudate or lesions.Normal dentition.  Neck: normal, supple, no masses. Respiratory: clear to auscultation bilaterally, no wheezing, no crackles. Normal respiratory effort. No accessory muscle use.  Cardiovascular: Regular rate and rhythm, no murmurs / rubs / gallops. No extremity edema. 2+ pedal pulses. Abdomen: Distended abdomen, tender to light palpation throughout, palpable masses Musculoskeletal: no clubbing / cyanosis. No joint deformity upper and  lower extremities. Good ROM, no contractures. Normal muscle tone.  Skin: no rashes, lesions, ulcers. No induration Neurologic: Sensation intact. Strength 5/5 in all 4.  Psychiatric: Normal judgment and insight. Alert and oriented x 3. Normal mood.   EKG: Personally reviewed. Sinus tachycardia, rate 104, LAE, no acute ischemic.  Assessment/Plan Principal Problem:   Cancer associated pain Active Problems:   Hypertensive urgency   Metastatic colon cancer to liver (HCC)   Trula Frede is a 48 y.o. 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  Assessment and Plan: Uncontrolled cancer associated pain: Stage IV colorectal cancer with peritoneal carcinomatosis and hepatic metastases: Pain has been uncontrolled despite home regimen.  CT A/P shows mild disease progression without bowel obstruction or colitis.  Will place back on regimen used during last admission.  Patient follows with oncology Dr. Cloretta as well as palliative care. - Oxycodone  20 mg every 8 hours - Oral Dilaudid  4-8 mg every 8 hours prn - IV Dilaudid  2 mg every 3 hours as needed for severe breakthrough pain - Consult to palliative care for symptom management and ongoing goals of care discussions  Constipation: Insetting of narcotic pain medicine use.  Patient reports no bowel movement for at least 1 week despite home bowel regimen.  CT A/P does not report bowel obstruction. - Start Senokot-S and MiraLAX  both scheduled twice daily - Dulcolax suppository as needed - Smog enema ordered if needed - Gentle IV fluid hydration overnight - KUB in a.m.  Hypertension: BP elevated in setting of uncontrolled pain as well as inability to take home antihypertensives the last 2 days. - IV hydralazine   ordered as needed - Resume home amlodipine , clonidine , Lopressor    DVT prophylaxis: enoxaparin  (LOVENOX ) injection 40 mg Start: 08/03/23 1000 Code Status: Full code, confirmed with patient on admission Family Communication: Daughter at bedside Disposition Plan: From home, dispo pending clinical progress Consults called: Palliative care Severity of Illness: The appropriate patient status for this patient is OBSERVATION. Observation status is judged to be reasonable and necessary in order to provide the required intensity of service to ensure the  patient's safety. The patient's presenting symptoms, physical exam findings, and initial radiographic and laboratory data in the context of their medical condition is felt to place them at decreased risk for further clinical deterioration. Furthermore, it is anticipated that the patient will be medically stable for discharge from the hospital within 2 midnights of admission.   Jorie Blanch MD [facility]  If 7PM-7AM, please contact night-coverage [email or web address]  08/03/2023, 12:00 AM

## 2023-08-02 NOTE — ED Provider Notes (Signed)
 [facility] AT [facility] Provider Note   CSN: [id number] Arrival date & time: 08/02/23  2006     Patient presents with: Abdominal Pain   April [name] is a 48 y.o. female with history of hypokalemia, hypertension, stage IV colorectal cancer presents emerged from today for evaluation of abdominal pain with constipation.  Patient reports that she is having worsening abdominal pain with past few weeks but worse over this past week.  She reports she has not had a bowel movement in over a week.  Reports that she has tried Colace, Dulcolax, lactulose , without a bowel movement.  She denies any fevers.  Denies any dysuria or hematuria.  Is having some nausea with occasional vomiting.  She reports she is able to have some gas but not as much.  Denies any fever or urinary symptoms.  She reports that she is having increasing rectal pain.  Patient started on home Dilaudid  without pain relief.   Abdominal Pain Associated symptoms: constipation, nausea and vomiting   Associated symptoms: no chest pain, no chills, no dysuria, no fever, no hematuria and no shortness of breath        Prior to Admission medications   Medication Sig Start Date End Date Taking? Authorizing Provider  alum & mag hydroxide-simeth (MAALOX/MYLANTA) 200-200-20 MG/5ML suspension Take 30 mLs by mouth every 6 (six) hours as needed for indigestion or heartburn. Patient not taking: Reported on 07/30/2023 06/26/23   Pokhrel, Laxman, MD  amLODipine  (NORVASC ) 10 MG tablet Take 1 tablet (10 mg total) by mouth daily. 06/26/23   Pokhrel, Laxman, MD  cloNIDine  (CATAPRES ) 0.1 MG tablet Take 3 tablets (0.3 mg total) by mouth 3 (three) times daily. 06/09/23   Shahmehdi, Adriana LABOR, MD  dexamethasone  (DECADRON ) 4 MG tablet Take 4 mg by mouth See admin instructions. Take 4 mg by mouth twice a day for 3 days beginning on day 2 of chemo. Patient not taking: Reported on 07/30/2023    [provider]   dicyclomine  (BENTYL ) 20 MG tablet Take 1 tablet (20 mg total) by mouth 3 (three) times daily as needed for spasms (rectal/AB pain). 02/17/23   Cloretta Arley NOVAK, MD  docusate sodium  (COLACE) 100 MG capsule Take 1 capsule (100 mg total) by mouth 2 (two) times daily. Patient not taking: Reported on 07/30/2023 06/19/23   Debby Olam POUR, NP  HYDROmorphone  (DILAUDID ) 4 MG tablet Take 1-2 tablets (4-8 mg total) by mouth every 4 (four) hours as needed for moderate pain (pain score 4-6). 07/28/23   Pickenpack-Cousar, Fannie SAILOR, NP  lactulose  (CHRONULAC ) 10 GM/15ML solution Take 15-30 mLs (10-20 g total) by mouth daily as needed for mild constipation. 07/06/23   Pickenpack-Cousar, Athena N, NP  metoprolol  tartrate (LOPRESSOR ) 100 MG tablet Take 1 tablet (100 mg total) by mouth 2 (two) times daily. 06/09/23 07/30/23  Willette Adriana LABOR, MD  oxyCODONE  ER (XTAMPZA  ER) 18 MG C12A Take 1 tablet by mouth every 12 (twelve) hours. 07/28/23   Pickenpack-Cousar, Athena N, NP  pantoprazole  (PROTONIX ) 40 MG tablet Take 1 tablet (40 mg total) by mouth daily before breakfast. 02/27/23   Drusilla Sabas RAMAN, MD  polyethylene glycol powder (GLYCOLAX /MIRALAX ) 17 GM/SCOOP powder Take 17 g by mouth daily as needed. Patient not taking: Reported on 07/30/2023 06/09/23   Willette Adriana LABOR, MD  potassium chloride  (MICRO-K ) 10 MEQ CR capsule Take 2 capsules (20 mEq total) by mouth 2 (two) times daily. 06/11/23   Burton, Lacie K, NP  prochlorperazine  (COMPAZINE )  10 MG tablet Take 1 tablet (10 mg total) by mouth every 6 (six) hours as needed for nausea or vomiting. Patient not taking: Reported on 07/30/2023 07/06/23   Pickenpack-Cousar, Athena N, NP  senna (SENOKOT) 8.6 MG TABS tablet Take 2 tablets (17.2 mg total) by mouth at bedtime. Patient not taking: Reported on 07/30/2023 06/26/23   Sonjia Held, MD  Sodium Chloride  Flush (NORMAL SALINE FLUSH) 0.9 % SOLN Use 5 mLs by Intracatheter route daily in biliary drain at 6 (six) AM as directed 03/14/23    [place], Camellia PARAS, DO    Allergies: Lisinopril, Irinotecan , Leucovorin  calcium , and Oxaliplatin     Review of Systems  Constitutional:  Negative for chills and fever.  Respiratory:  Negative for shortness of breath.   Cardiovascular:  Negative for chest pain.  Gastrointestinal:  Positive for abdominal distention, abdominal pain, constipation, nausea, rectal pain and vomiting.  Genitourinary:  Negative for dysuria and hematuria.    Updated Vital Signs BP (!) 184/130   Pulse 95   Temp 99 F (37.2 C)   Resp 16   Ht 5' 4 (1.626 m)   Wt 54.9 kg   LMP 01/07/2006   SpO2 99%   BMI 20.77 kg/m   Physical Exam Vitals and nursing note reviewed.  Constitutional:      Appearance: She is ill-appearing.  Eyes:     General: No scleral icterus. Cardiovascular:     Rate and Rhythm: Tachycardia present.  Pulmonary:     Effort: Pulmonary effort is normal. No respiratory distress.  Abdominal:     General: Bowel sounds are decreased. There is distension.     Palpations: There is mass.     Tenderness: There is generalized abdominal tenderness.  Skin:    General: Skin is warm and dry.  Neurological:     Mental Status: She is alert.     (all labs ordered are listed, but only abnormal results are displayed) Labs Reviewed  COMPREHENSIVE METABOLIC PANEL WITH GFR - Abnormal; Notable for the following components:      Result Value   Chloride 95 (*)    Glucose, Bld 103 (*)    Albumin 3.0 (*)    AST 48 (*)    Alkaline Phosphatase 661 (*)    All other components within normal limits  CBC WITH DIFFERENTIAL/PLATELET - Abnormal; Notable for the following components:   WBC 12.6 (*)    RBC 3.79 (*)    Hemoglobin 10.4 (*)    HCT 33.3 (*)    RDW 16.8 (*)    Platelets 415 (*)    Neutro Abs 8.1 (*)    All other components within normal limits  URINALYSIS, ROUTINE W REFLEX MICROSCOPIC - Abnormal; Notable for the following components:   APPearance CLOUDY (*)    Specific Gravity, Urine 1.036 (*)     Hgb urine dipstick MODERATE (*)    Protein, ur 30 (*)    All other components within normal limits  I-STAT CHEM 8, ED - Abnormal; Notable for the following components:   Chloride 96 (*)    Glucose, Bld 104 (*)    Calcium , Ion 1.08 (*)    TCO2 36 (*)    Hemoglobin 11.2 (*)    HCT 33.0 (*)    All other components within normal limits  CULTURE, BLOOD (ROUTINE X 2)  CULTURE, BLOOD (ROUTINE X 2)  URINE CULTURE  LIPASE, BLOOD  LACTIC ACID, PLASMA  LACTIC ACID, PLASMA  MAGNESIUM     EKG: None  Radiology:  CT ABDOMEN PELVIS W CONTRAST Result Date: 08/02/2023 EXAM: CT ABDOMEN AND PELVIS WITH CONTRAST 08/02/2023 09:22:06 PM TECHNIQUE: CT of the abdomen and pelvis was performed with the administration of intravenous contrast. Multiplanar reformatted images are provided for review. Automated exposure control, iterative reconstruction, and/or weight based adjustment of the mA/kV was utilized to reduce the radiation dose to as low as reasonably achievable. COMPARISON: 06/21/2023 CLINICAL HISTORY: Abdominal pain, acute, nonlocalized; Bowel obstruction suspected; stage 4 colon cancer, abdominal pain, unable to defecate. FINDINGS: LOWER CHEST: No acute abnormality. LIVER: Scattered right hepatic metastases measuring up to 18 mm and fairly in the right hepatic lobe, progressive. Mild central intrahepatic ductal dilatation, mildly progressive, with two indwelling common duct stents. GALLBLADDER AND BILE DUCTS: Gallbladder is unremarkable. No biliary ductal dilatation. SPLEEN: No acute abnormality. PANCREAS: No acute abnormality. ADRENAL GLANDS: No acute abnormality. KIDNEYS, URETERS AND BLADDER: 18 mm simple left lower pole renal cyst (image 38 ), benign (Bosniak 1 ). No follow up is recommended. Bilateral nonobstructing renal calculi measuring up to 10 mm in the left upper kidney and right lower kidney. No hydronephrosis. No perinephric or periureteral stranding. Urinary bladder is unremarkable. GI AND  BOWEL: Suspected soft tissue mass along the mid rectum with adjacent peritoneal nodularity along the posterior uterus, favored to be mildly progressive. PERITONEUM AND RETROPERITONEUM: Trace perihepatic ascites. Scattered peritoneal disease including omental caking beneath the right anterior abdominal wall, grossly unchanged. Small volume pelvic ascites, with a 6.6 cm loculated fluid at the lesion in the left deep pelvis, mildly progressive. VASCULATURE: Aorta is normal in caliber. LYMPH NODES: No lymphadenopathy. REPRODUCTIVE ORGANS: Soft tissue nodularity along the posterior uterus, as above. BONES AND SOFT TISSUES: No acute osseous abnormality. No focal soft tissue abnormality. IMPRESSION: 1. Mid rectal soft tissue mass, mildly progressive. 2. Right hepatic metastases, progressive. 3. Peritoneal disease/omental caking, stable versus mildly progressive. 4. Small volume abdominopelvic ascites, mildly progressive. Electronically signed by: Pinkie Pebbles MD 08/02/2023 09:40 PM EDT RP Workstation: [id number]   Procedures   Medications Ordered in the ED  promethazine  (PHENERGAN ) 12.5 mg in sodium chloride  0.9 % 50 mL IVPB (0 mg Intravenous Stopped 08/02/23 2205)  HYDROmorphone  (DILAUDID ) injection 2 mg (has no administration in time range)  labetalol  (NORMODYNE ) injection 10 mg (has no administration in time range)  lactated ringers  bolus 1,000 mL (1,000 mLs Intravenous New Bag/Given 08/02/23 2039)  HYDROmorphone  (DILAUDID ) injection 1 mg (1 mg Intravenous Given 08/02/23 2037)  HYDROmorphone  (DILAUDID ) injection 1 mg (1 mg Intravenous Given 08/02/23 2123)  iohexol  (OMNIPAQUE ) 300 MG/ML solution 100 mL (100 mLs Intravenous Contrast Given 08/02/23 2114)                                Medical Decision Making Amount and/or Complexity of Data Reviewed Labs: ordered. Radiology: ordered.  Risk Prescription drug management. Decision regarding hospitalization.   48 y.o. female presents to the ER for  evaluation of diffuse abdominal pain. Differential diagnosis includes but is not limited to AAA, mesenteric ischemia, appendicitis, diverticulitis, DKA, gastroenteritis, nephrolithiasis, pancreatitis, constipation, UTI, bowel obstruction, biliary disease, IBD, PUD, hepatitis, worsening lunacy, fecal impaction. Vital signs elevated blood pressure, tachycardic to 103, afebrile, otherwise unremarkable. Physical exam as noted above.   I independently reviewed and interpreted the patient's labs.  CBC with leukocytosis of 12.6.  Anemia at 10.4 with a hematocrit of 33.3.  Appears to be around her baseline.  Platelets of 415.  Lipase within normal limits.  CMP shows chloride of 95, glucose of 103.  Albumin at 3.0 with an AST of 48.  Alk phos is elevated 661 however appears to be on her baseline.  Urinalysis shows cloudy and concentrated urine with moderate hemoglobin present with 30 protein.  There is greater than 50 red blood cells seen but no white blood cells or bacteria.  Not complaining of any urinary symptoms.  Will add on urine culture.  Lactic acid within normal limits.  Blood cultures pending.  CT Abd Pelvis shows  1. Mid rectal soft tissue mass, mildly progressive. 2. Right hepatic metastases, progressive. 3. Peritoneal disease/omental caking, stable versus mildly progressive. 4. Small volume abdominopelvic ascites, mildly progressive. Per radiologist's interpretation.    Patient has had uncontrolled pain here and has had 3 individual doses of Dilaudid .  She is hypertensive but has not taken her blood pressure medication.  Have ordered 10 mg dose of labetalol .  There is no signs of infection seen in the abdomen however there is mildly progressive/possibly stable.  Will need to admit patient for uncontrollable pain related to malignancy.  She is wanting to speak with palliative care about possible pain pump that she was offered last time.  [facility]  hospitalist, Dr. Tobie to admit.   Portions of this report  may have been transcribed using voice recognition software. Every effort was made to ensure accuracy; however, inadvertent computerized transcription errors may be present.    Final diagnoses:  Uncontrolled pain  Constipation, unspecified constipation type    ED Discharge Orders     None          Bernis Ernst, [place] 08/02/23 2255    Bernard Drivers, MD 08/03/23 [phone]

## 2023-08-02 NOTE — ED Triage Notes (Signed)
 Pt BIB [facility] from [facility] with c/o abdominal pain, hx of colon cancer and abd tumors, distention noted, vss en route

## 2023-08-02 NOTE — ED Notes (Signed)
 Pt to CT

## 2023-08-02 NOTE — ED Notes (Signed)
 ED TO INPATIENT HANDOFF REPORT  ED Nurse Name and Phone #:   S Name/Age/Gender April [name] 48 y.o. female Room/Bed: WA25/WA25  Code Status   Code Status: Prior  Home/SNF/Other Home Patient oriented to: self, place, time, and situation Is this baseline? 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  See Progress note on 10/03/20.    Leucovorin  Calcium  Itching and Other (See Comments)    Patient reported itchy palms towards end of transfusion, see progress note 08/04/22.    Oxaliplatin  Itching and Other (See Comments)    Patient reported itchy palms towards end of transfusion, see progress note on 08/04/22.    Level of Care/Admitting Diagnosis ED Disposition     ED Disposition  Admit   Condition  --   Comment  Hospital Area: [facility] [[id number]]  Level of Care: Progressive [102]  Admit to Progressive based on following criteria: CARDIOVASCULAR & THORACIC of moderate stability with acute coronary syndrome symptoms/low risk myocardial infarction/hypertensive urgency/arrhythmias/heart failure potentially compromising stability and stable post cardiovascular intervention patients.  May place patient in observation at [facility] or [facility] if equivalent level of care is available:: No  Covid Evaluation: Asymptomatic - no recent exposure (last 10 days) testing not required  Diagnosis: Cancer associated pain [309085]  Admitting Physician: TOBIE JORIE SAUNDERS [[id number]]  Attending Physician: TOBIE JORIE SAUNDERS [[id number]]  For  patients discharging to extended facilities (i.e. SNF, AL, group homes or LTAC) initiate:: Discharge to SNF/Facility Placement COVID-19 Lab Testing Protocol          B Medical/Surgery History Past Medical History:  Diagnosis Date   Ascending aortic aneurysm (HCC) 12/16/2022   Benign essential hypertension 10/06/2017   Last Assessment & Plan:  Formatting of this note might be different from the original. Patient's blood pressure noted to be elevated at today's visit 188/98.  Patient will be provided with antihypertensives in the treatment center if needed in order to meet her Avastin  parameters.  Patient recommended to follow up with her primary care physician regarding medication management.  Patient is not cur   Cancer [facility])    Change in bowel habits 06/28/2022   Colon cancer (HCC)    History of colon cancer 06/28/2022   Hypertension    Prolonged QT interval 01/07/2018   Renal calculi    bilateral   Sepsis due to undetermined organism (HCC) 12/24/2022   Sialoadenitis of submandibular gland 12/16/2022   Past Surgical History:  Procedure Laterality Date   BALLOON DILATION N/A 02/26/2023   Procedure: BALLOON DILATION;  Surgeon: Wilhelmenia Aloha Raddle., MD;  Location: [facility];  Service: Gastroenterology;  Laterality: N/A;   BILIARY BRUSHING  03/24/2022   Procedure: BILIARY BRUSHING;  Surgeon: Wilhelmenia Aloha Raddle., MD;  Location: [facility] ENDOSCOPY;  Service: Gastroenterology;;   BILIARY DILATION  03/24/2022   Procedure: BILIARY DILATION;  Surgeon: Wilhelmenia Aloha Raddle., MD;  Location: [facility] ENDOSCOPY;  Service: Gastroenterology;;   BILIARY STENT PLACEMENT  03/24/2022   Procedure: BILIARY STENT PLACEMENT;  Surgeon: Wilhelmenia Aloha Raddle., MD;  Location: [facility] ENDOSCOPY;  Service: Gastroenterology;;   BILIARY STENT PLACEMENT N/A 02/26/2023   Procedure: BILIARY STENT PLACEMENT;  Surgeon: Wilhelmenia Aloha Raddle., MD;  Location: [facility];  Service: Gastroenterology;  Laterality: N/A;    BIOPSY  03/24/2022   Procedure: BIOPSY;  Surgeon: Wilhelmenia Aloha Raddle., MD;  Location: [facility] ENDOSCOPY;  Service: Gastroenterology;;   BIOPSY  11/23/2022   Procedure: BIOPSY;  Surgeon: Abran Norleen SAILOR, MD;  Location: [facility];  Service: Gastroenterology;;   BIOPSY  02/26/2023   Procedure: BIOPSY;  Surgeon: Wilhelmenia Aloha Raddle., MD;  Location: [facility];  Service: Gastroenterology;;   CESAREAN SECTION     x2   COLONOSCOPY N/A 11/23/2022   Procedure: COLONOSCOPY;  Surgeon: Abran Norleen SAILOR, MD;  Location: [facility];  Service: Gastroenterology;  Laterality: N/A;   CYSTOSCOPY WITH RETROGRADE PYELOGRAM, URETEROSCOPY AND STENT PLACEMENT Right 10/22/2020   Procedure: CYSTOSCOPY WITH RETROGRADE PYELOGRAM, URETEROSCOPY AND STENT PLACEMENT;  Surgeon: Elisabeth Valli BIRCH, MD;  Location: [facility];  Service: Urology;  Laterality: Right;   ENDOSCOPIC RETROGRADE CHOLANGIOPANCREATOGRAPHY (ERCP) WITH PROPOFOL  N/A 02/26/2023   Procedure: ENDOSCOPIC RETROGRADE CHOLANGIOPANCREATOGRAPHY (ERCP) WITH PROPOFOL ;  Surgeon: Wilhelmenia Aloha Raddle., MD;  Location: [facility];  Service: Gastroenterology;  Laterality: N/A;   ERCP N/A 03/24/2022   Procedure: ENDOSCOPIC RETROGRADE CHOLANGIOPANCREATOGRAPHY (ERCP);  Surgeon: Wilhelmenia Aloha Raddle., MD;  Location[facility];  Service: Gastroenterology;  Laterality: N/A;   ESOPHAGOGASTRODUODENOSCOPY (EGD) WITH PROPOFOL  N/A 02/26/2023   Procedure: ESOPHAGOGASTRODUODENOSCOPY (EGD) WITH PROPOFOL ;  Surgeon: Wilhelmenia Aloha Raddle., MD;  Location: [facility];  Service: Gastroenterology;  Laterality: N/A;  patient needs EGD in addition to ERCP   IR PATIENT EVAL TECH 0-60 MINS  12/15/2022   IR PERC CHOLECYSTOSTOMY  03/11/2023   IR RADIOLOGIST EVAL & MGMT  09/16/2019   IR RADIOLOGIST EVAL & MGMT  09/23/2019   IR RADIOLOGIST EVAL & MGMT  09/20/2019   IR RADIOLOGIST EVAL & MGMT  09/30/2019   IR RADIOLOGIST EVAL & MGMT  04/20/2023   IR REMOVAL TUN ACCESS W/ PORT W/O FL MOD SED  09/14/2019    IR VENO/EXT/UNI RIGHT  04/11/2022   PANCREATIC STENT PLACEMENT  03/24/2022   Procedure: PANCREATIC STENT PLACEMENT;  Surgeon: Wilhelmenia Aloha Raddle., MD;  Location: [facility];  Service: Gastroenterology;;   REMOVAL OF STONES  03/24/2022   Procedure: REMOVAL OF STONES;  Surgeon: Wilhelmenia Aloha Raddle., MD;  Location: [facility] ENDOSCOPY;  Service: Gastroenterology;;   REMOVAL OF STONES  02/26/2023   Procedure: REMOVAL OF Sludge;  Surgeon: Wilhelmenia Aloha Raddle., MD;  Location: [facility];  Service: Gastroenterology;;   ANNETT  03/24/2022   Procedure: ANNETT;  Surgeon: Mansouraty, Aloha Raddle., MD;  Location: [facility] ENDOSCOPY;  Service: Gastroenterology;;   CLEDA REMOVAL  02/26/2023   Procedure: STENT REMOVAL;  Surgeon: Wilhelmenia Aloha Raddle., MD;  Location: [facility];  Service: Gastroenterology;;   URETERAL STENT PLACEMENT       A IV Location/Drains/Wounds Patient Lines/Drains/Airways Status     Active Line/Drains/Airways     Name Placement date Placement time Site Days   Peripheral IV 08/02/23 22 G 1 Right Hand 08/02/23  2033  Hand  less than 1   Peripheral IV 08/02/23 20 G 1 Right Forearm 08/02/23  2033  Forearm  less than 1   PICC Double Lumen 12/16/22 Left Brachial 41 cm 1 cm 12/16/22  1833  -- 229   Biliary Tube Cholecystostomy 10 Fr. RUQ 03/11/23  1500  RUQ  144   Ureteral Drain/Stent Right ureter 6 Fr. 10/23/20  0011  Right ureter  1013   GI Stent 03/24/22  1504  --  496   GI Stent 02/26/23  1329  --  157   GI Stent 02/26/23  1339  --  157            Intake/Output Last 24 hours No intake or output data in the 24 hours ending 08/02/23 2336  Labs/Imaging Results for orders placed or performed during the hospital encounter of 08/02/23 (from the past 48 hours)  Comprehensive metabolic panel     Status: Abnormal   Collection Time: 08/02/23  8:32 PM  Result Value Ref Range   Sodium 137 135 - 145 mmol/L   Potassium 3.8 3.5 - 5.1 mmol/L   Chloride 95 (L) 98 -  111 mmol/L   CO2 31 22 - 32 mmol/L   Glucose, Bld 103 (H) 70 - 99 mg/dL    Comment: Glucose reference range applies only to samples taken after fasting for at least 8 hours.   BUN 8 6 - 20 mg/dL   Creatinine, Ser 9.27 0.44 - 1.00 mg/dL   Calcium  9.0 8.9 - 10.3 mg/dL   Total Protein 7.7 6.5 - 8.1 g/dL   Albumin 3.0 (L) 3.5 - 5.0 g/dL   AST 48 (H) 15 - 41 U/L   ALT 16 0 - 44 U/L   Alkaline Phosphatase 661 (H) 38 - 126 U/L   Total Bilirubin 0.6 0.0 - 1.2 mg/dL   GFR, Estimated >39 >39 mL/min    Comment: (NOTE) Calculated using the CKD-EPI Creatinine Equation (2021)    Anion gap 11 5 - 15    Comment: Performed at [facility], [address], [place], [place] [address]  Lipase, blood     Status: None   Collection Time: 08/02/23  8:32 PM  Result Value Ref Range   Lipase 35 11 - 51 U/L    Comment: Performed at [facility], [address] [place], [place] [address]  CBC with Diff     Status: Abnormal   Collection Time: 08/02/23  8:32 PM  Result Value Ref Range   WBC 12.6 (H) 4.0 - 10.5 K/uL   RBC 3.79 (L) 3.87 - 5.11 MIL/uL   Hemoglobin 10.4 (L) 12.0 - 15.0 g/dL   HCT 66.6 (L) 63.9 - 53.9 %   MCV 87.9 80.0 - 100.0 fL   MCH 27.4 26.0 - 34.0 pg   MCHC 31.2 30.0 - 36.0 g/dL   RDW 83.1 (H) 88.4 - 84.4 %   Platelets 415 (H) 150 - 400 K/uL   nRBC 0.0 0.0 - 0.2 %   Neutrophils Relative % 64 %   Neutro Abs 8.1 (H) 1.7 - 7.7 K/uL   Lymphocytes Relative 24 %   Lymphs Abs 3.0 0.7 - 4.0 K/uL   Monocytes Relative 7 %   Monocytes Absolute 0.8 0.1 - 1.0 K/uL   Eosinophils Relative 4 %   Eosinophils Absolute 0.5 0.0 - 0.5 K/uL   Basophils Relative 1 %   Basophils Absolute 0.1 0.0 - 0.1 K/uL   Immature Granulocytes 0 %   Abs Immature Granulocytes 0.04 0.00 - 0.07 K/uL    Comment: Performed at [facility], [address], [place], [place] [address]  I-stat chem 8, ED (not at [facility], [facility] or [facility])     Status: Abnormal   Collection Time:  08/02/23  8:41 PM  Result Value Ref Range   Sodium 137 135 - 145 mmol/L   Potassium 4.3 3.5 -  5.1 mmol/L   Chloride 96 (L) 98 - 111 mmol/L   BUN 8 6 - 20 mg/dL   Creatinine, Ser 9.19 0.44 - 1.00 mg/dL   Glucose, Bld 895 (H) 70 - 99 mg/dL    Comment: Glucose reference range applies only to samples taken after fasting for at least 8 hours.   Calcium , Ion 1.08 (L) 1.15 - 1.40 mmol/L   TCO2 36 (H) 22 - 32 mmol/L   Hemoglobin 11.2 (L) 12.0 - 15.0 g/dL   HCT 66.9 (L) 63.9 - 53.9 %  Lactic acid, plasma     Status: None   Collection Time: 08/02/23  9:10 PM  Result Value Ref Range   Lactic Acid, Venous 1.2 0.5 - 1.9 mmol/L    Comment: Performed at [facility], [address], [place], [place] [address]  Urinalysis, Routine w reflex microscopic -Urine, Clean Catch     Status: Abnormal   Collection Time: 08/02/23  9:47 PM  Result Value Ref Range   Color, Urine YELLOW YELLOW   APPearance CLOUDY (A) CLEAR   Specific Gravity, Urine 1.036 (H) 1.005 - 1.030   pH 7.0 5.0 - 8.0   Glucose, UA NEGATIVE NEGATIVE mg/dL   Hgb urine dipstick MODERATE (A) NEGATIVE   Bilirubin Urine NEGATIVE NEGATIVE   Ketones, ur NEGATIVE NEGATIVE mg/dL   Protein, ur 30 (A) NEGATIVE mg/dL   Nitrite NEGATIVE NEGATIVE   Leukocytes,Ua NEGATIVE NEGATIVE   RBC / HPF >50 0 - 5 RBC/hpf   WBC, UA 0-5 0 - 5 WBC/hpf   Bacteria, UA NONE SEEN NONE SEEN   Squamous Epithelial / HPF 0-5 0 - 5 /HPF   Mucus PRESENT    Amorphous Crystal PRESENT     Comment: Performed at [facility], [address] [place], [place] [address]   *Note: Due to a large number of results and/or encounters for the requested time period, some results have not been displayed. A complete set of results can be found in Results Review.   CT ABDOMEN PELVIS W CONTRAST Result Date: 08/02/2023 EXAM: CT ABDOMEN AND PELVIS WITH CONTRAST 08/02/2023 09:22:06 PM TECHNIQUE: CT of the abdomen and pelvis was performed with the  administration of intravenous contrast. Multiplanar reformatted images are provided for review. Automated exposure control, iterative reconstruction, and/or weight based adjustment of the mA/kV was utilized to reduce the radiation dose to as low as reasonably achievable. COMPARISON: 06/21/2023 CLINICAL HISTORY: Abdominal pain, acute, nonlocalized; Bowel obstruction suspected; stage 4 colon cancer, abdominal pain, unable to defecate. FINDINGS: LOWER CHEST: No acute abnormality. LIVER: Scattered right hepatic metastases measuring up to 18 mm and fairly in the right hepatic lobe, progressive. Mild central intrahepatic ductal dilatation, mildly progressive, with two indwelling common duct stents. GALLBLADDER AND BILE DUCTS: Gallbladder is unremarkable. No biliary ductal dilatation. SPLEEN: No acute abnormality. PANCREAS: No acute abnormality. ADRENAL GLANDS: No acute abnormality. KIDNEYS, URETERS AND BLADDER: 18 mm simple left lower pole renal cyst (image 38 ), benign (Bosniak 1 ). No follow up is recommended. Bilateral nonobstructing renal calculi measuring up to 10 mm in the left upper kidney and right lower kidney. No hydronephrosis. No perinephric or periureteral stranding. Urinary bladder is unremarkable. GI AND BOWEL: Suspected soft tissue mass along the mid rectum with adjacent peritoneal nodularity along the posterior uterus, favored to be mildly progressive. PERITONEUM AND RETROPERITONEUM: Trace perihepatic ascites. Scattered peritoneal disease including omental caking beneath the right anterior abdominal wall, grossly unchanged. Small volume pelvic ascites, with a 6.6  cm loculated fluid at the lesion in the left deep pelvis, mildly progressive. VASCULATURE: Aorta is normal in caliber. LYMPH NODES: No lymphadenopathy. REPRODUCTIVE ORGANS: Soft tissue nodularity along the posterior uterus, as above. BONES AND SOFT TISSUES: No acute osseous abnormality. No focal soft tissue abnormality. IMPRESSION: 1. Mid rectal  soft tissue mass, mildly progressive. 2. Right hepatic metastases, progressive. 3. Peritoneal disease/omental caking, stable versus mildly progressive. 4. Small volume abdominopelvic ascites, mildly progressive. Electronically signed by: Pinkie Pebbles MD 08/02/2023 09:40 PM EDT RP Workstation: [id number]    Pending Labs Unresulted Labs (From admission, onward)     Start     Ordered   08/02/23 2241  Urine Culture  Once,   URGENT       Question Answer Comment  Indication Suprapubic pain   Patient immune status Immunocompromised      08/02/23 2240   08/02/23 2049  Magnesium   Add-on,   AD        08/02/23 2049   08/02/23 2048  Blood culture (routine x 2)  BLOOD CULTURE X 2,   R (with STAT occurrences)      08/02/23 2048            Vitals/Pain Today's Vitals   08/02/23 2300 08/02/23 2315 08/02/23 2318 08/02/23 2330  BP: (!) 193/138 (!) 202/134  (!) 197/130  Pulse: (!) 106 91  92  Resp: 15 15    Temp:      SpO2: 99% 99%  96%  Weight:      Height:      PainSc:   10-Worst pain ever     Isolation Precautions No active isolations  Medications Medications  promethazine  (PHENERGAN ) 12.5 mg in sodium chloride  0.9 % 50 mL IVPB (0 mg Intravenous Stopped 08/02/23 2205)  lactated ringers  bolus 1,000 mL (1,000 mLs Intravenous New Bag/Given 08/02/23 2039)  HYDROmorphone  (DILAUDID ) injection 1 mg (1 mg Intravenous Given 08/02/23 2037)  HYDROmorphone  (DILAUDID ) injection 1 mg (1 mg Intravenous Given 08/02/23 2123)  iohexol  (OMNIPAQUE ) 300 MG/ML solution 100 mL (100 mLs Intravenous Contrast Given 08/02/23 2114)  HYDROmorphone  (DILAUDID ) injection 2 mg (2 mg Intravenous Given 08/02/23 2318)  labetalol  (NORMODYNE ) injection 10 mg (10 mg Intravenous Given 08/02/23 2318)    Mobility walks     Focused Assessments    R Recommendations: See Admitting Provider Note  Report given to:   Additional Notes:

## 2023-08-03 ENCOUNTER — Observation Stay (HOSPITAL_COMMUNITY)

## 2023-08-03 DIAGNOSIS — K831 Obstruction of bile duct: Secondary | ICD-10-CM | POA: Diagnosis present

## 2023-08-03 DIAGNOSIS — K5900 Constipation, unspecified: Secondary | ICD-10-CM

## 2023-08-03 DIAGNOSIS — Z79899 Other long term (current) drug therapy: Secondary | ICD-10-CM | POA: Diagnosis not present

## 2023-08-03 DIAGNOSIS — Z888 Allergy status to other drugs, medicaments and biological substances status: Secondary | ICD-10-CM | POA: Diagnosis not present

## 2023-08-03 DIAGNOSIS — Z90722 Acquired absence of ovaries, bilateral: Secondary | ICD-10-CM | POA: Diagnosis not present

## 2023-08-03 DIAGNOSIS — C189 Malignant neoplasm of colon, unspecified: Secondary | ICD-10-CM | POA: Diagnosis not present

## 2023-08-03 DIAGNOSIS — I10 Essential (primary) hypertension: Secondary | ICD-10-CM | POA: Diagnosis present

## 2023-08-03 DIAGNOSIS — R188 Other ascites: Secondary | ICD-10-CM | POA: Diagnosis present

## 2023-08-03 DIAGNOSIS — R52 Pain, unspecified: Secondary | ICD-10-CM | POA: Diagnosis not present

## 2023-08-03 DIAGNOSIS — R109 Unspecified abdominal pain: Secondary | ICD-10-CM | POA: Diagnosis not present

## 2023-08-03 DIAGNOSIS — D75839 Thrombocytosis, unspecified: Secondary | ICD-10-CM | POA: Diagnosis present

## 2023-08-03 DIAGNOSIS — R110 Nausea: Secondary | ICD-10-CM | POA: Diagnosis not present

## 2023-08-03 DIAGNOSIS — Z7189 Other specified counseling: Secondary | ICD-10-CM

## 2023-08-03 DIAGNOSIS — C786 Secondary malignant neoplasm of retroperitoneum and peritoneum: Secondary | ICD-10-CM | POA: Diagnosis present

## 2023-08-03 DIAGNOSIS — Z9079 Acquired absence of other genital organ(s): Secondary | ICD-10-CM | POA: Diagnosis not present

## 2023-08-03 DIAGNOSIS — N200 Calculus of kidney: Secondary | ICD-10-CM | POA: Diagnosis present

## 2023-08-03 DIAGNOSIS — C787 Secondary malignant neoplasm of liver and intrahepatic bile duct: Secondary | ICD-10-CM | POA: Diagnosis present

## 2023-08-03 DIAGNOSIS — Z515 Encounter for palliative care: Secondary | ICD-10-CM

## 2023-08-03 DIAGNOSIS — G893 Neoplasm related pain (acute) (chronic): Secondary | ICD-10-CM | POA: Diagnosis present

## 2023-08-03 DIAGNOSIS — D72829 Elevated white blood cell count, unspecified: Secondary | ICD-10-CM | POA: Diagnosis present

## 2023-08-03 DIAGNOSIS — E876 Hypokalemia: Secondary | ICD-10-CM | POA: Diagnosis present

## 2023-08-03 DIAGNOSIS — I160 Hypertensive urgency: Secondary | ICD-10-CM | POA: Diagnosis present

## 2023-08-03 DIAGNOSIS — C19 Malignant neoplasm of rectosigmoid junction: Secondary | ICD-10-CM | POA: Diagnosis present

## 2023-08-03 DIAGNOSIS — D630 Anemia in neoplastic disease: Secondary | ICD-10-CM | POA: Diagnosis present

## 2023-08-03 DIAGNOSIS — K644 Residual hemorrhoidal skin tags: Secondary | ICD-10-CM | POA: Diagnosis present

## 2023-08-03 DIAGNOSIS — I7121 Aneurysm of the ascending aorta, without rupture: Secondary | ICD-10-CM | POA: Diagnosis present

## 2023-08-03 LAB — CBC
HCT: 32.5 % — ABNORMAL LOW (ref 36.0–46.0)
Hemoglobin: 9.9 g/dL — ABNORMAL LOW (ref 12.0–15.0)
MCH: 27.1 pg (ref 26.0–34.0)
MCHC: 30.5 g/dL (ref 30.0–36.0)
MCV: 89 fL (ref 80.0–100.0)
Platelets: 407 K/uL — ABNORMAL HIGH (ref 150–400)
RBC: 3.65 MIL/uL — ABNORMAL LOW (ref 3.87–5.11)
RDW: 16.7 % — ABNORMAL HIGH (ref 11.5–15.5)
WBC: 13.9 K/uL — ABNORMAL HIGH (ref 4.0–10.5)
nRBC: 0 % (ref 0.0–0.2)

## 2023-08-03 LAB — MAGNESIUM
Magnesium: 1.9 mg/dL (ref 1.7–2.4)
Magnesium: 2 mg/dL (ref 1.7–2.4)

## 2023-08-03 LAB — BASIC METABOLIC PANEL WITH GFR
Anion gap: 12 (ref 5–15)
BUN: 7 mg/dL (ref 6–20)
CO2: 31 mmol/L (ref 22–32)
Calcium: 9 mg/dL (ref 8.9–10.3)
Chloride: 96 mmol/L — ABNORMAL LOW (ref 98–111)
Creatinine, Ser: 0.64 mg/dL (ref 0.44–1.00)
GFR, Estimated: >60 mL/min (ref >60)
Glucose, Bld: 103 mg/dL — ABNORMAL HIGH (ref 70–99)
Potassium: 3.1 mmol/L — ABNORMAL LOW (ref 3.5–5.1)
Sodium: 139 mmol/L (ref 135–145)

## 2023-08-03 MED ORDER — HYDROMORPHONE HCL 2 MG PO TABS
4.0000 mg | ORAL_TABLET | ORAL | Status: DC | PRN
  Administered 2023-08-03 – 2023-08-07 (×14): 8 mg via ORAL
  Filled 2023-08-03 (×11): qty 4
  Filled 2023-08-03 (×2): qty 2
  Filled 2023-08-03 (×7): qty 4

## 2023-08-03 MED ORDER — LACTULOSE 10 GM/15ML PO SOLN
20.0000 g | Freq: Four times a day (QID) | ORAL | Status: DC
  Administered 2023-08-03 – 2023-08-07 (×12): 20 g via ORAL
  Filled 2023-08-03 (×16): qty 30

## 2023-08-03 MED ORDER — SODIUM CHLORIDE 0.9% FLUSH: 10.0000 mL | INTRAVENOUS | Status: DC | PRN

## 2023-08-03 MED ORDER — SENNOSIDES-DOCUSATE SODIUM 8.6-50 MG PO TABS
3.0000 | ORAL_TABLET | Freq: Two times a day (BID) | ORAL | Status: DC
  Administered 2023-08-03 – 2023-08-12 (×13): 3 via ORAL
  Filled 2023-08-03 (×15): qty 3

## 2023-08-03 MED ORDER — FLEET ENEMA RE ENEM
1.0000 | ENEMA | Freq: Once | RECTAL | Status: AC
  Administered 2023-08-03: 1 via RECTAL
  Filled 2023-08-03: qty 1

## 2023-08-03 MED ORDER — CHLORHEXIDINE GLUCONATE CLOTH 2 % EX PADS
6.0000 | MEDICATED_PAD | Freq: Every day | CUTANEOUS | Status: DC
  Administered 2023-08-03 – 2023-08-12 (×10): 6 via TOPICAL

## 2023-08-03 MED ORDER — ENSURE PLUS HIGH PROTEIN PO LIQD
237.0000 mL | Freq: Two times a day (BID) | ORAL | Status: DC
  Administered 2023-08-03 – 2023-08-12 (×17): 237 mL via ORAL

## 2023-08-03 MED ORDER — POTASSIUM CHLORIDE CRYS ER 20 MEQ PO TBCR
40.0000 meq | EXTENDED_RELEASE_TABLET | ORAL | Status: AC
  Administered 2023-08-03 (×2): 40 meq via ORAL
  Filled 2023-08-03 (×2): qty 2

## 2023-08-03 NOTE — Progress Notes (Signed)
   08/03/23 0918  TOC Brief Assessment  Insurance and Status Reviewed  Patient has primary care physician Yes  Home environment has been reviewed single family home  Prior level of function: independent  Prior/Current Home Services No current home services  Social Drivers of Health Review SDOH reviewed no interventions necessary  Readmission risk has been reviewed Yes  Transition of care needs transition of care needs identified, TOC will continue to follow    TOC will continue to follow for discharge needs.  Heather Saltness, MSW, LCSW 08/03/2023 9:19 AM

## 2023-08-03 NOTE — Progress Notes (Signed)
 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  She admits to having severe constipation and has not had a bowel movement for about a week. CT of the abdomen pelvis shows mild rectal soft tissue mass with right hepatic metastasis, peritoneal disease and omental caking and small volume abdominal pelvic ascites.  Subjective:  Continues to complain of persistent abdominal pain and severe constipation.  Feels like stool is right there in her rectum but she is unable to get it out.  Has been able to tolerate solid food for breakfast.  Assessment and Plan: Principal Problem: Colon cancer with metastasis and  CBD obstruction -Has bilateral hepatic stents that were placed in 02/26/2023-GI recommended follow-up in 6 to 12 months for stent exchange -Current abdominal pain may be secondary to severe constipation - Continue attempts to relieve her constipation with laxatives and enemas as needed - salvage therapy with FOLFOX and panitumumab  within the next 1-2 weeks.   Active Problems:   Cancer associated pain -Appreciate palliative care assistance with managing pain  Hypertensive urgency - Possibly secondary to underlying pain - Has improved - Continue amlodipine , metoprolol  and as needed hydralazine    Bilateral renal calculi - Nonobstructive    Code Status: Full Code Total time on patient care: 35 minutes DVT prophylaxis:  enoxaparin  (LOVENOX ) injection 40 mg Start: 08/03/23 1000     Objective:   Vitals:   08/03/23 0624 08/03/23 0818 08/03/23 0841 08/03/23 1254  BP: (!) 166/112 (!) 170/112  (!) 149/114  Pulse:   88 80  Resp:      Temp:   99.5 F  (37.5 C) 98.4 F (36.9 C)  TempSrc:   Oral Oral  SpO2:   100% 100%  Weight:      Height:       Filed Weights   08/02/23 2014 08/03/23 0009  Weight: 54.9 kg 56.7 kg   Exam: General exam: Appears comfortable  HEENT: oral mucosa moist Respiratory system: Clear to auscultation.  Cardiovascular system: S1 & S2 heard  Gastrointestinal system: Distended abdomen, tender to palpation, bowel sounds positive Extremities: No cyanosis, clubbing or edema Psychiatry:  Mood & affect appropriate.     CBC: Recent Labs  Lab 07/30/23 1027 08/02/23 2032 08/02/23 2041 08/03/23 0044  WBC 10.7* 12.6*  --  13.9*  NEUTROABS 6.7 8.1*  --   --   HGB 12.0 10.4* 11.2* 9.9*  HCT 37.1 33.3* 33.0* 32.5*  MCV 85.3 87.9  --  89.0  PLT 419* 415*  --  407*   Basic Metabolic Panel: Recent Labs  Lab 07/30/23 1027 08/02/23 2032 08/02/23 2041 08/03/23 0044  NA 137 137 137 139  K 2.9* 3.8 4.3 3.1*  CL 93* 95* 96* 96*  CO2 30 31  --  31  GLUCOSE 114* 103* 104* 103*  BUN 8 8 8 7   CREATININE 0.50 0.72 0.80 0.64  CALCIUM  9.9 9.0  --  9.0  MG 1.9 2.0  --  1.9     Scheduled Meds:  amLODipine   10 mg Oral Daily   Chlorhexidine  Gluconate Cloth  6 each Topical Daily   cloNIDine   0.3 mg Oral TID   enoxaparin  (LOVENOX )  injection  40 mg Subcutaneous Q24H   feeding supplement  237 mL Oral BID BM   lactulose   20 g Oral QID   metoprolol  tartrate  100 mg Oral BID   oxyCODONE   20 mg Oral Q8H   polyethylene glycol  17 g Oral BID   senna-docusate  1 tablet Oral BID   sodium chloride  flush  3 mL Intravenous Q12H    Imaging and lab data personally reviewed   Author: Rosaleigh Brazzel  08/03/2023 2:04 PM  To contact [facility]>   Check the care team in [facility] and look for the attending/consulting [facility] provider listed  Log into [email or web address] and use [place]'s universal password   Go to> [facility]  and find provider  If you still have difficulty reaching the provider, please page the [facility]  (Director on Call) for the Hospitalists listed on amion

## 2023-08-03 NOTE — Consult Note (Signed)
 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  Patient noted that she had not had a bowel movement for about a week prior to presentation.  Imaging obtained noted progression of disease.  Oncology consulted for recommendations.  Palliative medicine team consulted to assist with complex medical decision making and pain management. Of note patient followed by outpatient PMT  provider at [facility].  Reviewed EMR including recent documentation from hospitalist and oncologist.  As per oncology documentation, patient was last treated with cancer directed therapies on 06/19/2023 and this was discontinued secondary to poor tolerance and lack of clinical improvement.  Patient was referred to [facility] though she is not eligible for a clinical trial.  Patient is being offered salvage chemotherapy in outpatient setting.  Oncology recommended possible GI involvement to consider sigmoidoscopy/rectal stent if constipation continued. At time of EMR review on past 24 hours patient has received as needed IV Dilaudid  2 mg x 2 doses and as needed oral Dilaudid  8 mg x 1 dose.  Presented to bedside to see patient.  Patient initially sleeping comfortably in bed though easily awakened.  Introduced myself as a member of the palliative medicine team.  Patient is familiar with our services as follows up with us  in the outpatient setting.  Patient noted she had recently received oral Dilaudid  and this was assisting with management of her  pain currently.  Patient having worsening abdominal pain.  Discussed patient does show progression of disease on imaging which could be worsening pain though also concerned patient has not had a bowel movement in a week which could contribute to pain.  Patient acknowledges this and does acknowledge her pain is worsened by constipation.  Discussed for constipation management, could provide oral options versus suppository/enema options.  CT imaging did not show signs of blockage.  Patient noted she is passing gas.  Discussed with patient who would prefer to take lactulose  more frequently to assist with symptom management.  Noted would schedule lactulose  4 times daily though this can be decreased once patient has a bowel movement.  Patient agreeing with this plan.  In terms of pain medications, patient noted that her home regimen normally works for her though she felt this time the oral Dilaudid  was not working.  Patient does feel the oral Dilaudid  she received today is assisting with pain management.  Patient notes that even at home when she takes the oral Dilaudid , does not last 4 hours.  Patient feels last more around 3 hours.  Acknowledges this and noted would change as needed oral Dilaudid  dosing frequency to every 3 hours as needed.  Noted would continue as needed IV Dilaudid  for breakthrough after oral Dilaudid .  Patient continues to receive long-acting pain medication OxyContin  20 mg every 12 hours.  Patient is normally on Xtampza  18 mg every 12 hours at home.  Noted in the outpatient setting PMP was attempting to determine if patient would be a candidate for a pain pump.  Discussed how this can only be performed in the outpatient  setting.  Patient acknowledging this.  Spent time answering questions as able.  Noted palliative medicine team continue to follow along with patient's medical journey.  Discussed care with hospitalist, RN, oncology, and outpatient PMT provider to coordinate care.  Primary  Diagnoses  Present on Admission:  Cancer associated pain  Metastatic colon cancer to liver [facility])  Hypertensive urgency   Palliative Review of Systems: Abdominal pain  Past Medical History:  Diagnosis Date   Ascending aortic aneurysm (HCC) 12/16/2022   Benign essential hypertension 10/06/2017   Last Assessment & Plan:  Formatting of this note might be different from the original. Patient's blood pressure noted to be elevated at today's visit 188/98.  Patient will be provided with antihypertensives in the treatment center if needed in order to meet her Avastin  parameters.  Patient recommended to follow up with her primary care physician regarding medication management.  Patient is not cur   Cancer [facility])    Change in bowel habits 06/28/2022   Colon cancer (HCC)    History of colon cancer 06/28/2022   Hypertension    Prolonged QT interval 01/07/2018   Renal calculi    bilateral   Sepsis due to undetermined organism (HCC) 12/24/2022   Sialoadenitis of submandibular gland 12/16/2022   Social History   Socioeconomic History   Marital status: Single    Spouse name: Not on file   Number of children: Not on file   Years of education: Not on file   Highest education level: Not on file  Occupational History   Not on file  Tobacco Use   Smoking status: Former    Types: Cigarettes   Smokeless tobacco: Never   Tobacco comments:    Quit 1 month ago   Vaping Use   Vaping status: Never Used  Substance and Sexual Activity   Alcohol use: Not Currently   Drug use: Not Currently    Types: Marijuana   Sexual activity: Not Currently  Other Topics Concern   Not on file  Social History Narrative   Not on file   Social Drivers of Health   Financial Resource Strain: Low Risk  (11/11/2022)   Overall Financial Resource Strain (CARDIA)    Difficulty of Paying Living Expenses: Not hard at all  Food Insecurity: No Food Insecurity (08/03/2023)   Hunger Vital Sign    Worried About Running  Out of Food in the Last Year: Never true    Ran Out of Food in the Last Year: Never true  Transportation Needs: No Transportation Needs (08/03/2023)   PRAPARE - Administrator, Civil Service (Medical): No    Lack of Transportation (Non-Medical): No  Physical Activity: Inactive (11/11/2022)   Exercise Vital Sign    Days of Exercise per Week: 0 days    Minutes of Exercise per Session: 0 min  Stress: No Stress Concern Present (11/11/2022)   [employer] of Occupational Health - Occupational Stress Questionnaire    Feeling of Stress : Not at all  Social Connections: Socially Isolated (01/24/2023)   Social Connection and Isolation Panel    Frequency of Communication with Friends and Family: More than three times a week    Frequency of Social Gatherings with Friends and Family: More than three times a week    Attends Religious Services: Never    Database administrator or Organizations: No    Attends Banker Meetings: Never    Marital Status: Never married   Family History  Problem Relation  Age of Onset   Hypertension Mother    Diabetes Mellitus II Father    Stroke Father    Ulcers Sister    Colon cancer Neg Hx    Esophageal cancer Neg Hx    Inflammatory bowel disease Neg Hx    Liver disease Neg Hx    Pancreatic cancer Neg Hx    Rectal cancer Neg Hx    Stomach cancer Neg Hx    Scheduled Meds:  amLODipine   10 mg Oral Daily   Chlorhexidine  Gluconate Cloth  6 each Topical Daily   cloNIDine   0.3 mg Oral TID   enoxaparin  (LOVENOX ) injection  40 mg Subcutaneous Q24H   metoprolol  tartrate  100 mg Oral BID   oxyCODONE   20 mg Oral Q8H   polyethylene glycol  17 g Oral BID   potassium chloride   40 mEq Oral Q4H   senna-docusate  1 tablet Oral BID   sodium chloride  flush  3 mL Intravenous Q12H   Continuous Infusions:  sodium chloride  75 mL/hr at 08/03/23 0034   promethazine  (PHENERGAN ) injection (IM or IVPB) Stopped (08/02/23 2205)   PRN Meds:.acetaminophen   **OR** acetaminophen , bisacodyl , dicyclomine , hydrALAZINE , HYDROmorphone  (DILAUDID ) injection, HYDROmorphone , magnesium  hydroxide, promethazine  (PHENERGAN ) injection (IM or IVPB), sodium chloride  flush, SMOG Allergies  Allergen Reactions   Lisinopril Swelling and Other (See Comments)    Lip swelling and Angioedema   Irinotecan  Other (See Comments)    Stomach cramping Patient given 0.5 mL atropine  IV and was able to continue infusion.  See Progress note on 10/03/20.    Leucovorin  Calcium  Itching and Other (See Comments)    Patient reported itchy palms towards end of transfusion, see progress note 08/04/22.    Oxaliplatin  Itching and Other (See Comments)    Patient reported itchy palms towards end of transfusion, see progress note on 08/04/22.   CBC:    Component Value Date/Time   WBC 13.9 (H) 08/03/2023 0044   HGB 9.9 (L) 08/03/2023 0044   HGB 12.0 07/30/2023 1027   HCT 32.5 (L) 08/03/2023 0044   PLT 407 (H) 08/03/2023 0044   PLT 419 (H) 07/30/2023 1027   MCV 89.0 08/03/2023 0044   NEUTROABS 8.1 (H) 08/02/2023 2032   LYMPHSABS 3.0 08/02/2023 2032   MONOABS 0.8 08/02/2023 2032   EOSABS 0.5 08/02/2023 2032   BASOSABS 0.1 08/02/2023 2032   Comprehensive Metabolic Panel:    Component Value Date/Time   NA 139 08/03/2023 0044   K 3.1 (L) 08/03/2023 0044   CL 96 (L) 08/03/2023 0044   CO2 31 08/03/2023 0044   BUN 7 08/03/2023 0044   CREATININE 0.64 08/03/2023 0044   CREATININE 0.50 07/30/2023 1027   GLUCOSE 103 (H) 08/03/2023 0044   CALCIUM  9.0 08/03/2023 0044   AST 48 (H) 08/02/2023 2032   AST 53 (H) 07/30/2023 1027   ALT 16 08/02/2023 2032   ALT 16 07/30/2023 1027   ALKPHOS 661 (H) 08/02/2023 2032   BILITOT 0.6 08/02/2023 2032   BILITOT 0.4 07/30/2023 1027   PROT 7.7 08/02/2023 2032   ALBUMIN 3.0 (L) 08/02/2023 2032    Physical Exam: Vital Signs: BP (!) 166/112 (BP Location: Right Arm)   Pulse 83   Temp 98.1 F (36.7 C) (Oral)   Resp 15   Ht 5' 4 (1.626 m)   Wt 56.7  kg   LMP 01/07/2006   SpO2 100%   BMI 21.46 kg/m  SpO2: SpO2: 100 % O2 Device: O2 Device: Room Air O2 Flow Rate:   Intake/output  summary:  Intake/Output Summary (Last 24 hours) at 08/03/2023 0800 Last data filed at 08/03/2023 0410 Gross per 24 hour  Intake 318.55 ml  Output --  Net 318.55 ml   LBM: Last BM Date :  (per pt stated about a week ago.) Baseline Weight: Weight: 54.9 kg Most recent weight: Weight: 56.7 kg  General: NAD, alert, chronically ill-appearing Cardiovascular: RR Respiratory: no increased work of breathing noted, not in respiratory distress Abdomen: distended Neuro: A&Ox4, following commands easily Psych: appropriately answers all questions          Palliative Performance Scale: 50%              Additional Data Reviewed: Recent Labs    08/02/23 2032 08/02/23 2041 08/03/23 0044  WBC 12.6*  --  13.9*  HGB 10.4* 11.2* 9.9*  PLT 415*  --  407*  NA 137 137 139  BUN 8 8 7   CREATININE 0.72 0.80 0.64    Imaging: Abd 1 View (KUB) CLINICAL DATA:  48 year old female with constipation. Metastatic colon cancer.  EXAM: ABDOMEN - 1 VIEW  COMPARISON:  CT Abdomen and Pelvis last night.  FINDINGS: Supine AP view of the lower abdomen and pelvis at 0509 hours. Excreted IV contrast now in the urinary bladder. Plastic biliary stents redemonstrated. Nephrolithiasis redemonstrated. Bowel-gas pattern unchanged from the CT Abdomen and Pelvis last night. Stable visualized osseous structures.  IMPRESSION: 1. Unchanged bowel-gas pattern from CT last night. 2. Redemonstrated biliary stents, nephrolithiasis.  Electronically Signed   By: VEAR Hurst M.D.   On: 08/03/2023 05:50    I personally reviewed recent imaging.   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  Patient noted that she had not had a bowel movement for about a week prior to presentation.  Imaging obtained noted progression of disease.  Oncology consulted for recommendations.  Palliative medicine team consulted to assist with complex medical decision making and pain management. Of note patient followed by outpatient PMT  provider at [facility].  # Complex medical decision making/goals of care  - Discussed care with patient as detailed above in HPI.  Patient has been reviewing plans for medical care moving forward with oncologist.  Patient is being offered further cancer directed therapies in the outpatient setting and wants to pursue these.  Palliative medicine team will engage in conversations moving forward as able and appropriate.  -  Code Status: Full Code   # Symptom management Patient is receiving these palliative interventions for symptom management with an intent to improve quality of life.   - Pain, acute on chronic in setting of metastatic colon cancer   -Continue OxyContin  20 mg every 12 hours scheduled   - Change as needed oral Dilaudid  4-8 mg every 3 hours as needed   - Continue IV Dilaudid  2 mg every 3 hours as needed breakthrough pain after oral short acting Dilaudid    - Outpatient PMT provider considering possible pain pump.  Would need to follow-up in outpatient setting for further discussions regarding this.   - Constipation Likely worsening patient's abdominal pain.  Patient notes she has not gone to the bathroom in a week.  Patient states she is passing gas.  Imaging did not report signs of obstruction.  Patient does  not want to receive another enema at this time and would like to try oral medications for management.   - Start lactulose  20 mg 4 times daily.  Can decrease based on symptom burden/relief.   - Continue bisacodyl  suppository 10 mg daily as needed   - Continue milk of  magnesia 30 mg daily as needed   - Continue MiraLAX  17 g twice daily   - Increase senna to 3 tab twice daily   - Oncology has recommended consideration of GI involvement if needed for sigmoidoscopy/rectal stent if constipation and symptoms continue.  # Psycho-social/Spiritual Support:  - Support System: Mother  # Discharge Planning:  To Be Determined -Already follows up with outpatient PMT at [facility].  Thank you for allowing the palliative care team to participate in the care [facility].  Tinnie Radar, DO Palliative Care Provider PMT # [phone]  If patient remains symptomatic despite maximum doses, please call PMT at [phone] between 0700 and 1900. Outside of these hours, please call attending, as PMT does not have night coverage.  Personally spent 80 minutes in patient care including extensive chart review (labs, imaging, progress/consult notes, vital signs), medically appropraite exam, discussed with treatment team, education to patient, family, and staff, documenting clinical information, medication review and management, coordination of care, and available advanced directive documents.

## 2023-08-03 NOTE — Progress Notes (Addendum)
 April [name]   DOB:Dec 12, 1975   FM#:[id number]      ASSESSMENT & PLAN:  April [name] is a 48 year old female patient with oncologic history significant for sigmoid colon cancer, stage IV.  She was admitted 08/02/2023 due to worsening abdominal pain.  Follows with Dr. Cloretta.  Worsening abdominal pain Cancer associated pain Constipation - Patient admitted 08/02/2023 due to worsening abdominal pain. - Secondary to malignancy - Continue Senokot, MiraLAX  and enema for orders - Encourage fluids and hydration - Continue judicious pain management. - Palliative appreciated  Sigmoid colon cancer stage IV (pT4a,pN2b,M1c)  - Diagnosed 2021 -Status post robotic assisted low anterior resection, mesenteric lymphadenectomy, bilateral salpingo-- oophorectomy in 2021. - Status post FOLFIRI plus bevacizumab  initiated 05/24/2019.  Bevacizumab  held from cycle 5.  Completed cycle 11 12/26/2019. - Restarted on FOLFIRI plus panitumumab  09/19/2020, completed cycle 15 06/17/2021. - Started on cycle 1 Lonsurf  and Avastin  10/14/2021.  Cycle 6 completed 03/03/2022. - Subsequently given cycle 1 FOLFOX for 02/01/2022, cycle 11 given 09/22/2022.  Patient then declined further treatment. - Restarted patient on irinotecan  plus panitumumab  01/06/2023 with cycle 8 given 06/19/2023.  Irinotecan  discontinued 07/10/2023 due to lack of clinical benefit and poor tolerance. - Patient is currently off treatment.  The plan is to begin FOLFOX and add panitumumab  if she does not have a RAS mutation on recent repeat molecular testing. - Medical oncology/Dr. Cloretta following closely and will make further recommendations.  Anemia, normocytic - Hemoglobin 9.9 - Likely multifactorial due to malignancy, recent oncologic therapy, poor nutrition - Recommend PRBC transfusion for hemoglobin <7.0 - Continue to monitor CBC with differential  Thrombocytosis - Likely reactive - Continue to monitor    Code  Status Full   Subjective:  Patient seen awake alert and oriented x 3 reports ongoing rectal pain.  Patient just had enema done and reports feeling uncomfortable still despite pain meds she has received.  Admits to constipation.  Denies nausea, vomiting and other GI issues. Denies chest pain.  Patient reports she is wondering when her chemotherapy will be restarted. No other acute distress is noted.  Objective:   Intake/Output Summary (Last 24 hours) at 08/03/2023 9076 Last data filed at 08/03/2023 0410 Gross per 24 hour  Intake 318.55 ml  Output --  Net 318.55 ml     PHYSICAL EXAMINATION: ECOG PERFORMANCE STATUS: 3 - Symptomatic, >50% confined to bed  Vitals:   08/03/23 0818 08/03/23 0841  BP: (!) 170/112   Pulse:  88  Resp:    Temp:  99.5 F (37.5 C)  SpO2:  100%   Filed Weights   08/02/23 2014 08/03/23 0009  Weight: 121 lb (54.9 kg) 125 lb (56.7 kg)    GENERAL: alert, +mild distress +thin-appearing SKIN: skin color, texture, turgor are normal, no rashes or significant lesions EYES: normal, conjunctiva are pink and non-injected, sclera clear OROPHARYNX: no exudate, no erythema and lips, buccal mucosa, and tongue normal  NECK: supple, thyroid normal size, non-tender, without nodularity LYMPH: no palpable lymphadenopathy in the cervical, axillary or inguinal LUNGS: clear to auscultation and percussion with normal breathing effort HEART: regular rate & rhythm and no murmurs and no lower extremity edema ABDOMEN: +abdominal distention MUSCULOSKELETAL: no cyanosis of digits and no clubbing  PSYCH: alert & oriented x 3 with fluent speech NEURO: no focal motor/sensory deficits   All questions were answered. The patient knows to call the clinic with any problems, questions or concerns.   The total time spent in the appointment was  40 minutes encounter with patient including review of chart and various tests results, discussions about plan of care and coordination of care  plan  Olam JINNY Brunner, NP 08/03/2023 9:23 AM    Labs Reviewed:  Lab Results  Component Value Date   WBC 13.9 (H) 08/03/2023   HGB 9.9 (L) 08/03/2023   HCT 32.5 (L) 08/03/2023   MCV 89.0 08/03/2023   PLT 407 (H) 08/03/2023   Recent Labs    11/21/22 0557 11/22/22 0710 12/16/22 0505 12/17/22 0500 06/21/23 0539 06/22/23 0415 07/10/23 0920 07/30/23 1027 08/02/23 2032 08/02/23 2041 08/03/23 0044  NA 138   < > 132*   < > 133*   < > 138 137 137 137 139  K 3.5   < > 2.9*   < > 3.4*   < > 3.2* 2.9* 3.8 4.3 3.1*  CL 101   < > 97*   < > 95*   < > 98 93* 95* 96* 96*  CO2 24   < > 29   < > 29   < > 32 30 31  --  31  GLUCOSE 139*   < > 97   < > 83   < > 80 114* 103* 104* 103*  BUN 9   < > 8   < > 7   < > 6 8 8 8 7   CREATININE 0.58   < > 0.52   < > 0.37*   < > 0.40* 0.50 0.72 0.80 0.64  CALCIUM  10.3   < > 8.5*   < > 8.5*   < > 9.3 9.9 9.0  --  9.0  GFRNONAA >60   < > >60   < > >60   < > >60 >60 >60  --  >60  PROT 8.9*   < > 7.1   < > 6.9  --  7.1 8.6* 7.7  --   --   ALBUMIN 4.4  4.5   < > 3.2*   < > 2.6*  --  3.3* 4.0 3.0*  --   --   AST 35   < > 37   < > 25  --  60* 53* 48*  --   --   ALT 24   < > 28   < > 16  --  32 16 16  --   --   ALKPHOS 172*   < > 204*   < > 606*  --  785* 971* 661*  --   --   BILITOT 0.6   < > 0.5   < > 0.7  --  0.3 0.4 0.6  --   --   BILIDIR <0.1  --  0.1  --  <0.1  --   --   --   --   --   --   IBILI NOT CALCULATED  --  0.4  --  NOT CALCULATED  --   --   --   --   --   --    < > = values in this interval not displayed.    Studies Reviewed:  Abd 1 View (KUB) Result Date: 08/03/2023 CLINICAL DATA:  48 year old female with constipation. Metastatic colon cancer. EXAM: ABDOMEN - 1 VIEW COMPARISON:  CT Abdomen and Pelvis last night. FINDINGS: Supine AP view of the lower abdomen and pelvis at 0509 hours. Excreted IV contrast now in the urinary bladder. Plastic biliary stents redemonstrated. Nephrolithiasis redemonstrated. Bowel-gas pattern unchanged from the CT  Abdomen  and Pelvis last night. Stable visualized osseous structures. IMPRESSION: 1. Unchanged bowel-gas pattern from CT last night. 2. Redemonstrated biliary stents, nephrolithiasis. Electronically Signed   By: VEAR Hurst M.D.   On: 08/03/2023 05:50   CT ABDOMEN PELVIS W CONTRAST Result Date: 08/02/2023 EXAM: CT ABDOMEN AND PELVIS WITH CONTRAST 08/02/2023 09:22:06 PM TECHNIQUE: CT of the abdomen and pelvis was performed with the administration of intravenous contrast. Multiplanar reformatted images are provided for review. Automated exposure control, iterative reconstruction, and/or weight based adjustment of the mA/kV was utilized to reduce the radiation dose to as low as reasonably achievable. COMPARISON: 06/21/2023 CLINICAL HISTORY: Abdominal pain, acute, nonlocalized; Bowel obstruction suspected; stage 4 colon cancer, abdominal pain, unable to defecate. FINDINGS: LOWER CHEST: No acute abnormality. LIVER: Scattered right hepatic metastases measuring up to 18 mm and fairly in the right hepatic lobe, progressive. Mild central intrahepatic ductal dilatation, mildly progressive, with two indwelling common duct stents. GALLBLADDER AND BILE DUCTS: Gallbladder is unremarkable. No biliary ductal dilatation. SPLEEN: No acute abnormality. PANCREAS: No acute abnormality. ADRENAL GLANDS: No acute abnormality. KIDNEYS, URETERS AND BLADDER: 18 mm simple left lower pole renal cyst (image 38 ), benign (Bosniak 1 ). No follow up is recommended. Bilateral nonobstructing renal calculi measuring up to 10 mm in the left upper kidney and right lower kidney. No hydronephrosis. No perinephric or periureteral stranding. Urinary bladder is unremarkable. GI AND BOWEL: Suspected soft tissue mass along the mid rectum with adjacent peritoneal nodularity along the posterior uterus, favored to be mildly progressive. PERITONEUM AND RETROPERITONEUM: Trace perihepatic ascites. Scattered peritoneal disease including omental caking beneath the right  anterior abdominal wall, grossly unchanged. Small volume pelvic ascites, with a 6.6 cm loculated fluid at the lesion in the left deep pelvis, mildly progressive. VASCULATURE: Aorta is normal in caliber. LYMPH NODES: No lymphadenopathy. REPRODUCTIVE ORGANS: Soft tissue nodularity along the posterior uterus, as above. BONES AND SOFT TISSUES: No acute osseous abnormality. No focal soft tissue abnormality. IMPRESSION: 1. Mid rectal soft tissue mass, mildly progressive. 2. Right hepatic metastases, progressive. 3. Peritoneal disease/omental caking, stable versus mildly progressive. 4. Small volume abdominopelvic ascites, mildly progressive. Electronically signed by: Pinkie Pebbles MD 08/02/2023 09:40 PM EDT RP Workstation: [id number]   Ms. Mcchesney was interviewed and examined.  She is well-known to me with a history of metastatic colon cancer.  She was last treated with irinotecan /panitumumab  on 06/19/2023.  The treatment was discontinued secondary to poor tolerance and lack of clinical improvement.  She is now admitted with abdominal pain and constipation.  The pain is likely in part related to constipation.  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  We can consider GI consultation for a sigmoidoscopy and possible rectal stent if the constipation persists.  I reviewed the admission CT findings and images with her.  There is evidence of progressive hepatic and peritoneal metastatic disease.  There is a persistent rectal mass.  She saw Dr. Sheilda earlier this month.  Ms. Coriz is not eligible for a current clinical trial at [facility].  The plan is to begin treatment with recycled FOLFOX +/- panitumumab  depending on results of NGS testing submitted at [facility] 2 weeks ago.  Ms. Devlin understands the chance of clinical improvement with further standard systemic therapy is small.  I discussed CODE STATUS with her today.  She would like to remain on a full CODE  STATUS.  Recommendations: 1.  Adjust narcotic analgesics per the palliative care service 2.  Laxative regimen 3.  GI  consult to consider a sigmoidoscopy/rectal stent if constipation and rectal pain persist 4.  FOLFOX +/- panitumumab  as an outpatient

## 2023-08-04 ENCOUNTER — Encounter: Payer: Self-pay | Admitting: Oncology

## 2023-08-04 DIAGNOSIS — R109 Unspecified abdominal pain: Secondary | ICD-10-CM

## 2023-08-04 DIAGNOSIS — C189 Malignant neoplasm of colon, unspecified: Secondary | ICD-10-CM | POA: Diagnosis not present

## 2023-08-04 DIAGNOSIS — D75839 Thrombocytosis, unspecified: Secondary | ICD-10-CM

## 2023-08-04 DIAGNOSIS — C787 Secondary malignant neoplasm of liver and intrahepatic bile duct: Secondary | ICD-10-CM | POA: Diagnosis not present

## 2023-08-04 DIAGNOSIS — D649 Anemia, unspecified: Secondary | ICD-10-CM

## 2023-08-04 DIAGNOSIS — G893 Neoplasm related pain (acute) (chronic): Secondary | ICD-10-CM | POA: Diagnosis not present

## 2023-08-04 LAB — CBC WITH DIFFERENTIAL/PLATELET
Abs Immature Granulocytes: 0.03 K/uL (ref 0.00–0.07)
Basophils Absolute: 0.1 K/uL (ref 0.0–0.1)
Basophils Relative: 1 %
Eosinophils Absolute: 0.5 K/uL (ref 0.0–0.5)
Eosinophils Relative: 4 %
HCT: 31.5 % — ABNORMAL LOW (ref 36.0–46.0)
Hemoglobin: 9.9 g/dL — ABNORMAL LOW (ref 12.0–15.0)
Immature Granulocytes: 0 %
Lymphocytes Relative: 24 %
Lymphs Abs: 2.5 K/uL (ref 0.7–4.0)
MCH: 28 pg (ref 26.0–34.0)
MCHC: 31.4 g/dL (ref 30.0–36.0)
MCV: 89 fL (ref 80.0–100.0)
Monocytes Absolute: 0.8 K/uL (ref 0.1–1.0)
Monocytes Relative: 8 %
Neutro Abs: 6.4 K/uL (ref 1.7–7.7)
Neutrophils Relative %: 63 %
Platelets: 411 K/uL — ABNORMAL HIGH (ref 150–400)
RBC: 3.54 MIL/uL — ABNORMAL LOW (ref 3.87–5.11)
RDW: 16.7 % — ABNORMAL HIGH (ref 11.5–15.5)
WBC: 10.2 K/uL (ref 4.0–10.5)
nRBC: 0 % (ref 0.0–0.2)

## 2023-08-04 LAB — BASIC METABOLIC PANEL WITH GFR
Anion gap: 12 (ref 5–15)
BUN: 6 mg/dL (ref 6–20)
CO2: 30 mmol/L (ref 22–32)
Calcium: 9 mg/dL (ref 8.9–10.3)
Chloride: 93 mmol/L — ABNORMAL LOW (ref 98–111)
Creatinine, Ser: 0.69 mg/dL (ref 0.44–1.00)
GFR, Estimated: >60 mL/min (ref >60)
Glucose, Bld: 109 mg/dL — ABNORMAL HIGH (ref 70–99)
Potassium: 3.8 mmol/L (ref 3.5–5.1)
Sodium: 135 mmol/L (ref 135–145)

## 2023-08-04 LAB — URINE CULTURE: Culture: <10000 — AB

## 2023-08-04 LAB — IRON AND TIBC
Iron: 26 ug/dL — ABNORMAL LOW (ref 28–170)
Saturation Ratios: 12 % (ref 10.4–31.8)
TIBC: 218 ug/dL — ABNORMAL LOW (ref 250–450)
UIBC: 192 ug/dL

## 2023-08-04 LAB — RETICULOCYTES
Immature Retic Fract: 15.4 % (ref 2.3–15.9)
RBC.: 3.55 MIL/uL — ABNORMAL LOW (ref 3.87–5.11)
Retic Count, Absolute: 57.5 K/uL (ref 19.0–186.0)
Retic Ct Pct: 1.6 % (ref 0.4–3.1)

## 2023-08-04 LAB — VITAMIN B12: Vitamin B-12: 620 pg/mL (ref 180–914)

## 2023-08-04 LAB — FOLATE: Folate: 6.1 ng/mL (ref >5.9)

## 2023-08-04 LAB — FERRITIN: Ferritin: 92 ng/mL (ref 11–307)

## 2023-08-04 MED ORDER — ALTEPLASE 2 MG IJ SOLR
2.0000 mg | Freq: Once | INTRAMUSCULAR | Status: AC
  Administered 2023-08-04: 2 mg
  Filled 2023-08-04: qty 2

## 2023-08-04 MED ORDER — FAMOTIDINE 20 MG PO TABS
20.0000 mg | ORAL_TABLET | Freq: Every day | ORAL | Status: DC
  Administered 2023-08-04: 20 mg via ORAL
  Filled 2023-08-04: qty 1

## 2023-08-04 MED ORDER — DIPHENHYDRAMINE HCL 25 MG PO CAPS
25.0000 mg | ORAL_CAPSULE | Freq: Every day | ORAL | Status: DC
  Administered 2023-08-04: 25 mg via ORAL
  Filled 2023-08-04: qty 1

## 2023-08-04 NOTE — Progress Notes (Addendum)
 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  She admits to having severe constipation and has not had a bowel movement for about a week. CT of the abdomen pelvis shows mild rectal soft tissue mass with right hepatic metastasis, peritoneal disease and omental caking and small volume abdominal pelvic ascites.  Subjective:  Continues to have abdominal pain.  Assessment and Plan: Principal Problem: Colon cancer with metastasis and  CBD obstruction -Has bilateral hepatic stents that were placed in 02/26/2023-GI recommended follow-up in 6 to 12 months for stent exchange -Current abdominal pain may be secondary to severe constipation - Continue attempts to relieve her constipation with laxatives and enemas as needed - Oncology plans to give her chemotherapy tomorrow while in the hospital  Active Problems:   Cancer associated pain -Appreciate palliative care assistance with managing pain -She continues to be on oral and IV pain medications  Hypertensive urgency - Possibly secondary to underlying pain - Has improved - Continue amlodipine , metoprolol  and as needed hydralazine   Hypokalemia - Replaced - Magnesium  normal at 1.9   Bilateral renal calculi - Nonobstructive  Normocytic anemia   - anemia likely due to anemia of chronic disease in setting of metastatic cancer  Leukocytosis and thrombocytosis -Possibly reactive to underlying pathology - No fevers noted - Urine culture shows less than 10,000 colonies - Blood cultures negative    Code Status: Full Code Total time on patient care: 35 minutes DVT prophylaxis:  enoxaparin  (LOVENOX ) injection  40 mg Start: 08/03/23 1000     Objective:   Vitals:   08/03/23 0841 08/03/23 1254 08/03/23 2025 08/04/23 0306  BP:  (!) 149/114 (!) 150/114 (!) 142/109  Pulse: 88 80 90 87  Resp:   16 20  Temp: 99.5 F (37.5 C) 98.4 F (36.9 C) 98 F (36.7 C) 98.5 F (36.9 C)  TempSrc: Oral Oral Oral Oral  SpO2: 100% 100% 100% 100%  Weight:      Height:       Filed Weights   08/02/23 2014 08/03/23 0009  Weight: 54.9 kg 56.7 kg   Exam: General exam: Appears comfortable  HEENT: oral mucosa moist Respiratory system: Clear to auscultation.  Cardiovascular system: S1 & S2 heard  Gastrointestinal system: Abdomen soft, non-tender, distended and tender, normal bowel sounds   Extremities: No cyanosis, clubbing or edema Psychiatry:  Mood & affect appropriate.     CBC: Recent Labs  Lab 07/30/23 1027 08/02/23 2032 08/02/23 2041 08/03/23 0044  WBC 10.7* 12.6*  --  13.9*  NEUTROABS 6.7 8.1*  --   --   HGB 12.0 10.4* 11.2* 9.9*  HCT 37.1 33.3* 33.0* 32.5*  MCV 85.3 87.9  --  89.0  PLT 419* 415*  --  407*   Basic Metabolic Panel: Recent Labs  Lab 07/30/23 1027 08/02/23 2032 08/02/23 2041 08/03/23 0044  NA 137 137 137 139  K 2.9* 3.8 4.3 3.1*  CL 93* 95* 96* 96*  CO2 30 31  --  31  GLUCOSE 114* 103* 104* 103*  BUN 8 8 8 7   CREATININE 0.50 0.72 0.80 0.64  CALCIUM  9.9 9.0  --  9.0  MG 1.9 2.0  --  1.9     Scheduled Meds:  amLODipine   10 mg Oral Daily   Chlorhexidine  Gluconate Cloth  6 each Topical Daily   cloNIDine   0.3 mg Oral TID   enoxaparin  (LOVENOX ) injection  40 mg Subcutaneous Q24H   feeding supplement  237 mL Oral BID BM   lactulose   20 g Oral QID   metoprolol  tartrate  100 mg Oral BID   oxyCODONE   20 mg Oral Q8H   polyethylene glycol  17 g Oral BID   senna-docusate  3 tablet Oral BID   sodium chloride  flush  3 mL Intravenous Q12H    Imaging and lab data personally reviewed   Author: Denys Labree  08/04/2023 7:16 AM  To contact [facility]>   Check the  care team in [facility] and look for the attending/consulting [facility] provider listed  Log into [email or web address] and use [place]'s universal password   Go to> [facility]  and find provider  If you still have difficulty reaching the provider, please page the [facility] (Director on Call) for the Hospitalists listed on amion

## 2023-08-04 NOTE — Progress Notes (Signed)
 VAST consult received to obtain labs from positional PICC line. Attempted to obtain blood return from both red and purple lumens with patient's arm at a 90 degree angle to body which has worked in past. Neither lumen with blood return and both lumens slightly sluggish to flush. Troubleshooting measures attempted without success. CathFlo ordered. MD and unit RN notified.

## 2023-08-04 NOTE — Progress Notes (Addendum)
 DISCONTINUE OFF PATHWAY REGIMEN - Colorectal   [id number]:Irinotecan  180 mg/m2 IV D1 + Panitumumab  6 mg/kg IV D1 q14 Days:   A cycle is every 14 days:     Panitumumab       Irinotecan    **Always confirm dose/schedule in your pharmacy ordering system**  PRIOR TREATMENT: Off Pathway: Irinotecan  180 mg/m2 IV D1 + Panitumumab  6 mg/kg IV D1 q14 Days  START OFF PATHWAY REGIMEN - Colorectal   [id number]:[id number] + Panitumumab  6 mg/kg IV D1 q14 Days:   A cycle is every 14 days:     Panitumumab       Oxaliplatin       Leucovorin       Fluorouracil       Fluorouracil    **Always confirm dose/schedule in your pharmacy ordering system**  Patient Characteristics: Distant Metastases, Nonsurgical Candidate, KRAS/NRAS Wild-Type (BRAF V600 Wild-Type/Unknown), Standard Cytotoxic Therapy, Fourth Line and Beyond Standard Cytotoxic Therapy Tumor Location: Colon Therapeutic Status: Distant Metastases Microsatellite/Mismatch Repair Status: MSS/pMMR BRAF Mutation Status: Wild-Type (no mutation) KRAS/NRAS Mutation Status: Wild-Type (no mutation) Preferred Therapy Approach: Standard Cytotoxic Therapy Standard Cytotoxic Line of Therapy: Fourth Line and Beyond Standard Cytotoxic Therapy Intent of Therapy: Non-Curative / Palliative Intent, Discussed with Patient

## 2023-08-04 NOTE — Progress Notes (Addendum)
 April [name]   DOB:Mar 23, 1975   FM#:[id number]      ASSESSMENT & PLAN:  April [name] is a 48 year old female patient with oncologic history significant for sigmoid colon cancer, stage IV.  She was admitted 08/02/2023 due to worsening abdominal pain.  Follows with Dr. Cloretta.   Worsening abdominal pain Cancer associated pain Constipation - Admitted 08/02/2023 due to worsening abdominal pain, still complaining of ongoing pain. - Secondary to malignancy - Continue laxative regimen, reports good bowel movement today. - Encourage fluids and hydration -- Considering GI consultation for a sigmoidoscopy and possible rectal stent if the constipation persists.  - Continue judicious pain management. - Palliative following closely   Sigmoid colon cancer stage IV (pT4a,pN2b,M1c)  - Diagnosed 2021 -Status post robotic assisted low anterior resection, mesenteric lymphadenectomy, bilateral salpingo-- oophorectomy in 2021. - Status post FOLFIRI plus bevacizumab  initiated 05/24/2019.  Bevacizumab  held from cycle 5.  Completed cycle 11 12/26/2019. - Restarted on FOLFIRI plus panitumumab  09/19/2020, completed cycle 15 06/17/2021. - Started on cycle 1 Lonsurf  and Avastin  10/14/2021.  Cycle 6 completed 03/03/2022. - Subsequently given cycle 1 FOLFOX for 02/01/2022, cycle 11 given 09/22/2022.  Patient then declined further treatment. - Restarted patient on irinotecan  plus panitumumab  01/06/2023 with cycle 8 given 06/19/2023.  Irinotecan  discontinued 07/10/2023 due to lack of clinical benefit and poor tolerance. - Currently off treatment.  The plan is to begin treatment with recycled FOLFOX +/- panitumumab  depending on results of NGS testing submitted at [facility] 2 weeks ago.  -- Consideration to restart chemotherapy 7/23 or 7/24.  - Medical oncology/Dr. Cloretta following closely.   Anemia, normocytic - Hemoglobin 9.9 on 7/21 - Likely multifactorial due to malignancy, recent oncologic therapy, poor  nutrition - Recommend PRBC transfusion for hemoglobin <7.0 - Continue to monitor CBC with differential   Thrombocytosis - Likely reactive, platelets slightly elevated 407K - Continue to monitor      Code Status Full   Subjective:  Patient reports that she had 2 bowel movements today, first was small  and the second was very good.  She states that her abdominal pain is still the same, gets relief for a short time and then pain returns quickly, does not think that she is getting her pain meds on time on the unit.  Family member at bedside.  No other acute distress is noted.  Objective:   Intake/Output Summary (Last 24 hours) at 08/04/2023 1009 Last data filed at 08/04/2023 0517 Gross per 24 hour  Intake 1293.5 ml  Output --  Net 1293.5 ml     PHYSICAL EXAMINATION: ECOG PERFORMANCE STATUS: 2 - Symptomatic, <50% confined to bed  Vitals:   08/04/23 0306 08/04/23 0914  BP: (!) 142/109 (!) 145/92  Pulse: 87 84  Resp: 20   Temp: 98.5 F (36.9 C)   SpO2: 100%    Filed Weights   08/02/23 2014 08/03/23 0009  Weight: 121 lb (54.9 kg) 125 lb (56.7 kg)    GENERAL: alert, +thin-appearing SKIN: skin color, texture, turgor are normal, no rashes or significant lesions EYES: normal, conjunctiva are pink and non-injected, sclera clear OROPHARYNX: no exudate, no erythema and lips, buccal mucosa, and tongue normal  NECK: supple, thyroid normal size, non-tender, without nodularity LYMPH: no palpable lymphadenopathy in the cervical, axillary or inguinal LUNGS: clear to auscultation and percussion with normal breathing effort HEART: regular rate & rhythm and no murmurs and no lower extremity edema ABDOMEN:+abdominal distension  MUSCULOSKELETAL: no cyanosis of digits and no clubbing  PSYCH: alert & oriented x 3 with fluent speech NEURO: no focal motor/sensory deficits   All questions were answered. The patient knows to call the clinic with any problems, questions or concerns.    The total time spent in the appointment was 40 minutes encounter with patient including review of chart and various tests results, discussions about plan of care and coordination of care plan  Olam JINNY Brunner, NP 08/04/2023 10:09 AM    Labs Reviewed:  Lab Results  Component Value Date   WBC 13.9 (H) 08/03/2023   HGB 9.9 (L) 08/03/2023   HCT 32.5 (L) 08/03/2023   MCV 89.0 08/03/2023   PLT 407 (H) 08/03/2023   Recent Labs    11/21/22 0557 11/22/22 0710 12/16/22 0505 12/17/22 0500 06/21/23 0539 06/22/23 0415 07/10/23 0920 07/30/23 1027 08/02/23 2032 08/02/23 2041 08/03/23 0044  NA 138   < > 132*   < > 133*   < > 138 137 137 137 139  K 3.5   < > 2.9*   < > 3.4*   < > 3.2* 2.9* 3.8 4.3 3.1*  CL 101   < > 97*   < > 95*   < > 98 93* 95* 96* 96*  CO2 24   < > 29   < > 29   < > 32 30 31  --  31  GLUCOSE 139*   < > 97   < > 83   < > 80 114* 103* 104* 103*  BUN 9   < > 8   < > 7   < > 6 8 8 8 7   CREATININE 0.58   < > 0.52   < > 0.37*   < > 0.40* 0.50 0.72 0.80 0.64  CALCIUM  10.3   < > 8.5*   < > 8.5*   < > 9.3 9.9 9.0  --  9.0  GFRNONAA >60   < > >60   < > >60   < > >60 >60 >60  --  >60  PROT 8.9*   < > 7.1   < > 6.9  --  7.1 8.6* 7.7  --   --   ALBUMIN 4.4  4.5   < > 3.2*   < > 2.6*  --  3.3* 4.0 3.0*  --   --   AST 35   < > 37   < > 25  --  60* 53* 48*  --   --   ALT 24   < > 28   < > 16  --  32 16 16  --   --   ALKPHOS 172*   < > 204*   < > 606*  --  785* 971* 661*  --   --   BILITOT 0.6   < > 0.5   < > 0.7  --  0.3 0.4 0.6  --   --   BILIDIR <0.1  --  0.1  --  <0.1  --   --   --   --   --   --   IBILI NOT CALCULATED  --  0.4  --  NOT CALCULATED  --   --   --   --   --   --    < > = values in this interval not displayed.    Studies Reviewed:  Abd 1 View (KUB) Result Date: 08/03/2023 CLINICAL DATA:  49 year old female with constipation. Metastatic colon cancer. EXAM: ABDOMEN - 1 VIEW  COMPARISON:  CT Abdomen and Pelvis last night. FINDINGS: Supine AP view of the lower abdomen  and pelvis at 0509 hours. Excreted IV contrast now in the urinary bladder. Plastic biliary stents redemonstrated. Nephrolithiasis redemonstrated. Bowel-gas pattern unchanged from the CT Abdomen and Pelvis last night. Stable visualized osseous structures. IMPRESSION: 1. Unchanged bowel-gas pattern from CT last night. 2. Redemonstrated biliary stents, nephrolithiasis. Electronically Signed   By: VEAR Hurst M.D.   On: 08/03/2023 05:50   CT ABDOMEN PELVIS W CONTRAST Result Date: 08/02/2023 EXAM: CT ABDOMEN AND PELVIS WITH CONTRAST 08/02/2023 09:22:06 PM TECHNIQUE: CT of the abdomen and pelvis was performed with the administration of intravenous contrast. Multiplanar reformatted images are provided for review. Automated exposure control, iterative reconstruction, and/or weight based adjustment of the mA/kV was utilized to reduce the radiation dose to as low as reasonably achievable. COMPARISON: 06/21/2023 CLINICAL HISTORY: Abdominal pain, acute, nonlocalized; Bowel obstruction suspected; stage 4 colon cancer, abdominal pain, unable to defecate. FINDINGS: LOWER CHEST: No acute abnormality. LIVER: Scattered right hepatic metastases measuring up to 18 mm and fairly in the right hepatic lobe, progressive. Mild central intrahepatic ductal dilatation, mildly progressive, with two indwelling common duct stents. GALLBLADDER AND BILE DUCTS: Gallbladder is unremarkable. No biliary ductal dilatation. SPLEEN: No acute abnormality. PANCREAS: No acute abnormality. ADRENAL GLANDS: No acute abnormality. KIDNEYS, URETERS AND BLADDER: 18 mm simple left lower pole renal cyst (image 38 ), benign (Bosniak 1 ). No follow up is recommended. Bilateral nonobstructing renal calculi measuring up to 10 mm in the left upper kidney and right lower kidney. No hydronephrosis. No perinephric or periureteral stranding. Urinary bladder is unremarkable. GI AND BOWEL: Suspected soft tissue mass along the mid rectum with adjacent peritoneal nodularity along  the posterior uterus, favored to be mildly progressive. PERITONEUM AND RETROPERITONEUM: Trace perihepatic ascites. Scattered peritoneal disease including omental caking beneath the right anterior abdominal wall, grossly unchanged. Small volume pelvic ascites, with a 6.6 cm loculated fluid at the lesion in the left deep pelvis, mildly progressive. VASCULATURE: Aorta is normal in caliber. LYMPH NODES: No lymphadenopathy. REPRODUCTIVE ORGANS: Soft tissue nodularity along the posterior uterus, as above. BONES AND SOFT TISSUES: No acute osseous abnormality. No focal soft tissue abnormality. IMPRESSION: 1. Mid rectal soft tissue mass, mildly progressive. 2. Right hepatic metastases, progressive. 3. Peritoneal disease/omental caking, stable versus mildly progressive. 4. Small volume abdominopelvic ascites, mildly progressive. Electronically signed by: Pinkie Pebbles MD 08/02/2023 09:40 PM EDT RP Workstation: [id number]   April [name] was interviewed and examined.  She continues to have pain and constipation.  The pain is chiefly at the rectum.  She would like to begin salvage systemic therapy.  We are waiting on results of NGS testing to determine whether she remains a candidate for panitumumab .  We discussed FOLFOX and CapeOx.  She would like to receive the first cycle of chemotherapy in the hospital.  She agrees to FOLFOX.  She is concerned that infusional 5-FU has caused nausea in the past.  I explained the likelihood is that the nausea has been from oxaliplatin  and irinotecan .  She will no longer receive irinotecan .  We reviewed potential toxicities associated with the FOLFOX regimen including the chance of nausea/vomiting, progressive and potentially permanent neuropathy, and an allergic reaction.  She agrees to proceed.  She will be premedicated with steroids and antihistamines prior to receiving repeat treatment with oxaliplatin .  She understands the small chance of clinical improvement with  FOLFOX/panitumumab .  She understands no therapy will be curative.  Systemic treatment  options are limited given the treatment history to date.  We can consider palliative radiation to the rectum her pain persists.  She is not appear to be obstructed at present.  There does not appear to to be an indication for a rectal stent.  We will contact the cancer center pharmacy and nursing staff to arrange for cycle 1 FOLFOX within the next 1-2 days.  A treatment plan was entered today.

## 2023-08-04 NOTE — Progress Notes (Signed)
 Patient previously tolerated FOLFOX in 2024 with Aloxi , Emend, Pepcid  IV 20mg , Benadryl  IV 50mg , and Dex IV 10mg  as premeds and oxali 65mg /m2 diluted in 1L D5 run over 4 hours. Patient to be reinitiated on FOLFOX while admitted d/t past rxns to oxali. Premeds and oxali dose are the same as when previously tolerated.   Oxali to be diluted in 500mL D5 and titrated as follows per Dr. Cloretta Start at 25 mL/h and increase every 30 minutes by 25 ml/h to maximum rate of 150 mL/h.   5-FU bolus changed to inpt standard order mixed in 50mL NS and 5-FU pump changed to inpt standard order mixed in 1L D5.  Ok to omit Vectibix  from C1D1 d/t inpt standard per Dr. Cloretta. KVO NS order deleted from hydration.  No new CBC w/diff or CMP needed to proceed with tx tomorrow, as ok to proceed with ANC=6.4 from CBC w/diff drawn 7/22 and AST=48, ALT=16, and T. Bili=0.6 from CMP drawn on 7/20 per Dr. Cloretta. BMP and CBC results from 7/23 will be reviewed prior to initiating chemo.  Pepcid  and Benadryl  PO orders for 7/23 discontinued per Dr. Cloretta, as pt will receive both via IV as premeds for tx.  Ryanna Teschner, PharmD, MBA

## 2023-08-04 NOTE — Progress Notes (Signed)
 PMT brief progress note  Currently on bedside commode Medication history noted Oncology note reviewed BP (!) 145/92   Pulse 84   Temp 98.5 F (36.9 C) (Oral)   Resp 20   Ht 5' 4 (1.626 m)   Wt 56.7 kg   LMP 01/07/2006   SpO2 100%   BMI 21.46 kg/m  Chart reviewed, Dr Clayton' consult note reviewed.  April [name]. [name] is a 48 year old female patient with oncologic history significant for sigmoid colon cancer, stage IV.  She was admitted 08/02/2023 due to worsening abdominal pain.  PMT consulted for Worsening abdominal pain,Cancer associated pain Constipation GI consultation for a sigmoidoscopy and possible rectal stent is being considered.  There is evidence of progressive hepatic and peritoneal metastatic disease. There is a persistent rectal mass.  Plan: Pain management: IV Dilaudid  PRN, PO Dilaudid  PRN, scheduled OxyContin  PO Q 8 hours.  Bowel regimen: miralax , lactulose , senna. Continue to monitor. Low MDM Lonia Serve MD [place] [facility]

## 2023-08-04 NOTE — Plan of Care (Signed)

## 2023-08-05 DIAGNOSIS — C189 Malignant neoplasm of colon, unspecified: Secondary | ICD-10-CM | POA: Diagnosis not present

## 2023-08-05 DIAGNOSIS — R109 Unspecified abdominal pain: Secondary | ICD-10-CM | POA: Diagnosis not present

## 2023-08-05 DIAGNOSIS — G893 Neoplasm related pain (acute) (chronic): Secondary | ICD-10-CM | POA: Diagnosis not present

## 2023-08-05 DIAGNOSIS — C787 Secondary malignant neoplasm of liver and intrahepatic bile duct: Secondary | ICD-10-CM

## 2023-08-05 LAB — BASIC METABOLIC PANEL WITH GFR
Anion gap: 12 (ref 5–15)
BUN: 6 mg/dL (ref 6–20)
CO2: 27 mmol/L (ref 22–32)
Calcium: 8.7 mg/dL — ABNORMAL LOW (ref 8.9–10.3)
Chloride: 95 mmol/L — ABNORMAL LOW (ref 98–111)
Creatinine, Ser: 0.51 mg/dL (ref 0.44–1.00)
GFR, Estimated: >60 mL/min (ref >60)
Glucose, Bld: 158 mg/dL — ABNORMAL HIGH (ref 70–99)
Potassium: 3.6 mmol/L (ref 3.5–5.1)
Sodium: 134 mmol/L — ABNORMAL LOW (ref 135–145)

## 2023-08-05 LAB — CBC
HCT: 32.2 % — ABNORMAL LOW (ref 36.0–46.0)
Hemoglobin: 9.8 g/dL — ABNORMAL LOW (ref 12.0–15.0)
MCH: 27.1 pg (ref 26.0–34.0)
MCHC: 30.4 g/dL (ref 30.0–36.0)
MCV: 89 fL (ref 80.0–100.0)
Platelets: 404 K/uL — ABNORMAL HIGH (ref 150–400)
RBC: 3.62 MIL/uL — ABNORMAL LOW (ref 3.87–5.11)
RDW: 16.6 % — ABNORMAL HIGH (ref 11.5–15.5)
WBC: 9.3 K/uL (ref 4.0–10.5)
nRBC: 0 % (ref 0.0–0.2)

## 2023-08-05 MED ORDER — DIPHENHYDRAMINE HCL 50 MG/ML IJ SOLN
50.0000 mg | Freq: Once | INTRAMUSCULAR | Status: AC
  Administered 2023-08-05: 50 mg via INTRAVENOUS
  Filled 2023-08-05: qty 1

## 2023-08-05 MED ORDER — FLUOROURACIL CHEMO INJECTION 5 GM/100ML
1800.0000 mg/m2 | Freq: Once | INTRAVENOUS | Status: AC
  Administered 2023-08-05: 2900 mg via INTRAVENOUS
  Filled 2023-08-05: qty 58

## 2023-08-05 MED ORDER — LEUCOVORIN CALCIUM INJECTION 350 MG
300.0000 mg/m2 | Freq: Once | INTRAMUSCULAR | Status: AC
  Administered 2023-08-05: 480 mg via INTRAVENOUS
  Filled 2023-08-05: qty 5

## 2023-08-05 MED ORDER — PALONOSETRON HCL INJECTION 0.25 MG/5ML
0.2500 mg | Freq: Once | INTRAVENOUS | Status: AC
Start: 2023-08-05 — End: 2023-08-05
  Administered 2023-08-05: 0.25 mg via INTRAVENOUS
  Filled 2023-08-05: qty 5

## 2023-08-05 MED ORDER — DEXAMETHASONE SODIUM PHOSPHATE 10 MG/ML IJ SOLN
10.0000 mg | Freq: Once | INTRAMUSCULAR | Status: AC
  Administered 2023-08-05: 10 mg via INTRAVENOUS
  Filled 2023-08-05: qty 1

## 2023-08-05 MED ORDER — OXALIPLATIN CHEMO INJECTION 100 MG/20ML
65.0000 mg/m2 | Freq: Once | INTRAVENOUS | Status: AC
  Administered 2023-08-05: 100 mg via INTRAVENOUS
  Filled 2023-08-05: qty 20

## 2023-08-05 MED ORDER — FAMOTIDINE IN NACL 20-0.9 MG/50ML-% IV SOLN
20.0000 mg | Freq: Once | INTRAVENOUS | Status: AC
  Administered 2023-08-05: 20 mg via INTRAVENOUS
  Filled 2023-08-05: qty 50

## 2023-08-05 MED ORDER — SODIUM CHLORIDE 0.9 % IV SOLN
150.0000 mg | Freq: Once | INTRAVENOUS | Status: AC
  Administered 2023-08-05: 150 mg via INTRAVENOUS
  Filled 2023-08-05: qty 5

## 2023-08-05 MED ORDER — SODIUM CHLORIDE 0.9 % IV SOLN
300.0000 mg/m2 | Freq: Once | INTRAVENOUS | Status: AC
  Administered 2023-08-05: 500 mg via INTRAVENOUS
  Filled 2023-08-05: qty 10

## 2023-08-05 MED ORDER — DEXTROSE 5 % IV SOLN: INTRAVENOUS | Status: DC

## 2023-08-05 NOTE — Progress Notes (Signed)
 PROGRESS NOTE  Zannie Locastro FMW:[id number] DOB: 11/27/1975 DOA: 08/02/2023 PCP: Oris Camie BRAVO, NP   LOS: 2 days   Brief narrative:  April [name] is a 48 y.o. 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  In the ED blood pressure was elevated.  Labs were notable for AST of 48 alkaline phosphatase 661.  Lipase 35.  Lactate of 1.2.  Urinalysis showed negative nitrate but more than 50 RBCs.  CT scan done showed mild rectal soft tissue mass with right hepatic metastasis peritoneal disease and omental caking with a small volume abdominal pelvic ascites.  Patient was then admitted to our hospital for further evaluation and treatment.  Assessment/Plan: Principal Problem:   Metastatic colon cancer to liver [facility]) Active Problems:   Cancer associated pain   Hypertensive urgency   Peritoneal carcinomatosis (HCC)   Goals of care, counseling/discussion   Constipation   Intractable pain  Stage IV sigmoid colon cancer with metastasis and  CBD obstruction Status post robotic low anterior resection in 2021.  History of chemotherapy in the past.  Status post bilateral hepatic stents on 02/26/2023 and GI had recommended follow-up in 6 to 12 months for stent exchange.  At this time oncology has seen the patient and oncology been started on chemotherapy 08/05/2023.  Intractable abdominal pain.  Has severe constipation.  Currently on MiraLAX  twice daily and Senokot twice daily.  Has had bowel movements as per the patient.   Cancer associated pain On IV and oral pain medications.  Palliative care on board.  Currently on Dilaudid  orally and IV with long-acting OxyContin .  Hypertensive urgency Likely secondary to underlying  pain.  Continue amlodipine , clonidine , metoprolol  and hydralazine .  Blood pressure still elevated.   Hypokalemia Replenished and improved.  Latest potassium of 3.6.   Bilateral renal calculi - Nonobstructive in nature.  Continue to monitor.   Normocytic anemia   Likely anemia of chronic disease in the setting of metastatic cancer.  Latest hemoglobin of 9.8.   Leukocytosis and thrombocytosis Likely reactive.  No fever.  Blood cultures urine cultures negative.  Leukocytosis has resolved.  Still with mild thrombocytosis.  DVT prophylaxis: enoxaparin  (LOVENOX ) injection 40 mg Start: 08/03/23 1000   Disposition: Likely home uncertain time.  Status is: Inpatient Remains inpatient appropriate because: Pending clinical improvement, intractable pain, initiation of chemotherapy,    Code Status:     Code Status: Full Code  Family Communication: None at bedside  Consultants: Palliative care Oncology  Procedures: Initiation of chemotherapy  Anti-infectives:  None  Anti-infectives (From admission, onward)    None        Subjective: Today, patient was seen and examined at bedside.  Patient states that her pain is better controlled on the current med  Denies any nausea vomiting fever chills or rigor.  Denies any shortness of breath dyspnea.  Has been started on chemotherapy.  Objective: Vitals:   08/04/23 1943 08/05/23 0249  BP: (!) 137/108 (!) 142/110  Pulse: 79 82  Resp: 16 20  Temp: 98.3 F (36.8 C) 98 F (36.7 C)  SpO2: 100% 100%    Intake/Output Summary (Last 24 hours) at 08/05/2023 1137 Last data filed at 08/05/2023 1000 Gross per 24 hour  Intake 790.8 ml  Output --  Net 790.8 ml  Filed Weights   08/02/23 2014 08/03/23 0009  Weight: 54.9 kg 56.7 kg   Body mass index is 21.46 kg/m.   Physical Exam: GENERAL: Patient is alert awake and oriented. Not in obvious distress.  Communicative. HENT: Pallor noted.  Pupils equally reactive to light. Oral mucosa  is moist NECK: is supple, no gross swelling noted. CHEST: Clear to auscultation. No crackles or wheezes.  Diminished breath sounds bilaterally. CVS: S1 and S2 heard, no murmur. Regular rate and rhythm.  ABDOMEN: Soft, tenderness of the light lower abdomen with mild distention, bowel sounds are present. EXTREMITIES: No edema.  Left upper extremity PICC line in place. CNS: Cranial nerves are intact. No focal motor deficits. SKIN: warm and dry without rashes.  Data Review: I have personally reviewed the following laboratory data and studies,  CBC: Recent Labs  Lab 07/30/23 1027 08/02/23 2032 08/02/23 2041 08/03/23 0044 08/04/23 1506 08/05/23 0204  WBC 10.7* 12.6*  --  13.9* 10.2 9.3  NEUTROABS 6.7 8.1*  --   --  6.4  --   HGB 12.0 10.4* 11.2* 9.9* 9.9* 9.8*  HCT 37.1 33.3* 33.0* 32.5* 31.5* 32.2*  MCV 85.3 87.9  --  89.0 89.0 89.0  PLT 419* 415*  --  407* 411* 404*   Basic Metabolic Panel: Recent Labs  Lab 07/30/23 1027 08/02/23 2032 08/02/23 2041 08/03/23 0044 08/04/23 1506 08/05/23 0204  NA 137 137 137 139 135 134*  K 2.9* 3.8 4.3 3.1* 3.8 3.6  CL 93* 95* 96* 96* 93* 95*  CO2 30 31  --  31 30 27   GLUCOSE 114* 103* 104* 103* 109* 158*  BUN 8 8 8 7 6 6   CREATININE 0.50 0.72 0.80 0.64 0.69 0.51  CALCIUM  9.9 9.0  --  9.0 9.0 8.7*  MG 1.9 2.0  --  1.9  --   --    Liver Function Tests: Recent Labs  Lab 07/30/23 1027 08/02/23 2032  AST 53* 48*  ALT 16 16  ALKPHOS 971* 661*  BILITOT 0.4 0.6  PROT 8.6* 7.7  ALBUMIN 4.0 3.0*   Recent Labs  Lab 08/02/23 2032  LIPASE 35   No results for input(s): AMMONIA in the last 168 hours. Cardiac Enzymes: No results for input(s): CKTOTAL, CKMB, CKMBINDEX, TROPONINI in the last 168 hours. BNP (last 3 results) No results for input(s): BNP in the last 8760 hours.  ProBNP (last 3 results) No results for input(s): PROBNP in the last 8760 hours.  CBG: No results for input(s): GLUCAP in the last 168  hours. Recent Results (from the past 240 hours)  Blood culture (routine x 2)     Status: None (Preliminary result)   Collection Time: 08/02/23  9:10 PM   Specimen: BLOOD  Result Value Ref Range Status   Specimen Description   Final    BLOOD BLOOD RIGHT HAND Performed at [facility], [address] [place], [place] [address]    Special Requests   Final    BOTTLES DRAWN AEROBIC AND ANAEROBIC Blood Culture adequate volume Performed at [facility], [address] [place], [place] [address]    Culture   Final    NO GROWTH 3 DAYS Performed at [facility], [address], [place], [place] [address]    Report Status PENDING  Incomplete  Blood culture (routine x 2)     Status: None (Preliminary result)   Collection Time: 08/02/23  9:35 PM   Specimen: BLOOD  Result Value  Ref Range Status   Specimen Description   Final    BLOOD BLOOD LEFT HAND Performed at [facility], [address] [place], [place] [address]    Special Requests   Final    BOTTLES DRAWN AEROBIC AND ANAEROBIC Blood Culture adequate volume Performed at [facility], [address], [place], [place] [address]    Culture   Final    NO GROWTH 2 DAYS Performed at [facility], [address] [place], [place] [address]    Report Status PENDING  Incomplete  Urine Culture     Status: Abnormal   Collection Time: 08/02/23 10:41 PM   Specimen: Urine, Clean Catch  Result Value Ref Range Status   Specimen Description   Final    URINE, CLEAN CATCH Performed at [facility], [address], [place], [place] [address]    Special Requests   Final    Immunocompromised Performed at [facility], [address], [place], [place] [address]    Culture (A)  Final    <10,000 COLONIES/mL INSIGNIFICANT GROWTH Performed at [facility], [address], [place], [place] [address]    Report Status  08/04/2023 FINAL  Final     Studies: No results found.    Vernal Alstrom, MD  [facility] 08/05/2023  If 7PM-7AM, please contact night-coverage

## 2023-08-05 NOTE — Progress Notes (Signed)
 April [name] was given her Chemotherapy treatment inpatient today. She signed her consent, went over possible side effects of her treatment with Daughter at bedside. Patient did well with treatment with no adverse reactions. 46hr adrucil  pump was started. Will return Friday at 1430 to remove.

## 2023-08-05 NOTE — Progress Notes (Addendum)
 April [name]   DOB:1975-09-18   FM#:[id number]      ASSESSMENT & PLAN:  April [name] is a 48 year old female patient with oncologic history significant for sigmoid colon cancer, stage IV.  She was admitted 08/02/2023 due to worsening abdominal pain.  Follows with Dr. Cloretta.   Worsening abdominal pain Cancer associated pain Constipation - Admitted 08/02/2023 due to worsening abdominal pain, still complaining of ongoing pain. - Secondary to malignancy - Reports she is now moving her bowels a lot.  Continue laxative regimen per Palliative recommendations.   - Encourage fluids and hydration -- Considering GI consultation for a sigmoidoscopy and possible rectal stent if the constipation persists; can hold this plan for now.  - Continue judicious pain management. - Palliative following closely   Sigmoid colon cancer stage IV (pT4a,pN2b,M1c)  - Diagnosed 2021 -Status post robotic assisted low anterior resection, mesenteric lymphadenectomy, bilateral salpingo-- oophorectomy in 2021. - Status post FOLFIRI plus bevacizumab  initiated 05/24/2019.  Bevacizumab  held from cycle 5.  Completed cycle 11 12/26/2019. - Restarted on FOLFIRI plus panitumumab  09/19/2020, completed cycle 15 06/17/2021. - Started on cycle 1 Lonsurf  and Avastin  10/14/2021.  Cycle 6 completed 03/03/2022. - Subsequently given cycle 1 FOLFOX for 02/01/2022, cycle 11 given 09/22/2022.  Patient then declined further treatment. - Restarted patient on irinotecan  plus panitumumab  01/06/2023 with cycle 8 given 06/19/2023.  Irinotecan  discontinued 07/10/2023 due to lack of clinical benefit and poor tolerance. - Currently off treatment.  The plan was to begin treatment with recycled FOLFOX +/- panitumumab  depending on results of NGS testing.  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 -- Will consider  palliative radiation to the rectum if her pain persists.   -- Starting FOLFOX chemo regimen today.  Monitor closely. - Medical oncology/Dr. Cloretta following closely.   Anemia, normocytic - Hemoglobin 9.8 today  - Likely multifactorial due to malignancy, recent oncologic therapy, poor nutrition - Recommend PRBC transfusion for hemoglobin <7.0 - Continue to monitor CBC with differential   Thrombocytosis - Likely reactive, platelets slightly elevated 404K - Continue to monitor        Code Status Full   Subjective:  Patient seen awake and alert sitting up in bed.  Reports that she still has some abdominal pain.  Admits to having a lot of bowel movement.  Currently seen with premeds for chemotherapy in progress.  No other acute distress is noted.  Objective:   Intake/Output Summary (Last 24 hours) at 08/05/2023 0923 Last data filed at 08/04/2023 1853 Gross per 24 hour  Intake 480 ml  Output --  Net 480 ml     PHYSICAL EXAMINATION: ECOG PERFORMANCE STATUS: 2 - Symptomatic, <50% confined to bed  Vitals:   08/04/23 1943 08/05/23 0249  BP: (!) 137/108 (!) 142/110  Pulse: 79 82  Resp: 16 20  Temp: 98.3 F (36.8 C) 98 F (36.7 C)  SpO2: 100% 100%   Filed Weights   08/02/23 2014 08/03/23 0009  Weight: 121 lb (54.9 kg) 125 lb (56.7 kg)    GENERAL: alert, + thin appearing SKIN: skin color, texture, turgor are normal, no rashes or significant lesions EYES: normal, conjunctiva are pink and non-injected, sclera clear OROPHARYNX: no exudate, no erythema and lips, buccal mucosa, and tongue normal  NECK: supple, thyroid normal size, non-tender, without nodularity LYMPH: no palpable lymphadenopathy in the cervical, axillary or inguinal  LUNGS: clear to auscultation and percussion with normal breathing effort HEART: regular rate & rhythm and no murmurs and no lower extremity edema ABDOMEN: abdomen soft, tender in the upper and right abdomen, normal bowel  sounds MUSCULOSKELETAL: no cyanosis of digits and no clubbing  PSYCH: alert & oriented x 3 with fluent speech NEURO: no focal motor/sensory deficits   All questions were answered. The patient knows to call the clinic with any problems, questions or concerns.   The total time spent in the appointment was 40 minutes encounter with patient including review of chart and various tests results, discussions about plan of care and coordination of care plan  Olam JINNY Brunner, NP 08/05/2023 9:23 AM    Labs Reviewed:  Lab Results  Component Value Date   WBC 9.3 08/05/2023   HGB 9.8 (L) 08/05/2023   HCT 32.2 (L) 08/05/2023   MCV 89.0 08/05/2023   PLT 404 (H) 08/05/2023   Recent Labs    11/21/22 0557 11/22/22 0710 12/16/22 0505 12/17/22 0500 06/21/23 0539 06/22/23 0415 07/10/23 0920 07/30/23 1027 08/02/23 2032 08/02/23 2041 08/03/23 0044 08/04/23 1506 08/05/23 0204  NA 138   < > 132*   < > 133*   < > 138 137 137   < > 139 135 134*  K 3.5   < > 2.9*   < > 3.4*   < > 3.2* 2.9* 3.8   < > 3.1* 3.8 3.6  CL 101   < > 97*   < > 95*   < > 98 93* 95*   < > 96* 93* 95*  CO2 24   < > 29   < > 29   < > 32 30 31  --  31 30 27   GLUCOSE 139*   < > 97   < > 83   < > 80 114* 103*   < > 103* 109* 158*  BUN 9   < > 8   < > 7   < > 6 8 8    < > 7 6 6   CREATININE 0.58   < > 0.52   < > 0.37*   < > 0.40* 0.50 0.72   < > 0.64 0.69 0.51  CALCIUM  10.3   < > 8.5*   < > 8.5*   < > 9.3 9.9 9.0  --  9.0 9.0 8.7*  GFRNONAA >60   < > >60   < > >60   < > >60 >60 >60  --  >60 >60 >60  PROT 8.9*   < > 7.1   < > 6.9  --  7.1 8.6* 7.7  --   --   --   --   ALBUMIN 4.4  4.5   < > 3.2*   < > 2.6*  --  3.3* 4.0 3.0*  --   --   --   --   AST 35   < > 37   < > 25  --  60* 53* 48*  --   --   --   --   ALT 24   < > 28   < > 16  --  32 16 16  --   --   --   --   ALKPHOS 172*   < > 204*   < > 606*  --  785* 971* 661*  --   --   --   --   BILITOT 0.6   < > 0.5   < >  0.7  --  0.3 0.4 0.6  --   --   --   --   BILIDIR <0.1  --   0.1  --  <0.1  --   --   --   --   --   --   --   --   IBILI NOT CALCULATED  --  0.4  --  NOT CALCULATED  --   --   --   --   --   --   --   --    < > = values in this interval not displayed.    Studies Reviewed:  Abd 1 View (KUB) Result Date: 08/03/2023 CLINICAL DATA:  48 year old female with constipation. Metastatic colon cancer. EXAM: ABDOMEN - 1 VIEW COMPARISON:  CT Abdomen and Pelvis last night. FINDINGS: Supine AP view of the lower abdomen and pelvis at 0509 hours. Excreted IV contrast now in the urinary bladder. Plastic biliary stents redemonstrated. Nephrolithiasis redemonstrated. Bowel-gas pattern unchanged from the CT Abdomen and Pelvis last night. Stable visualized osseous structures. IMPRESSION: 1. Unchanged bowel-gas pattern from CT last night. 2. Redemonstrated biliary stents, nephrolithiasis. Electronically Signed   By: VEAR Hurst M.D.   On: 08/03/2023 05:50   CT ABDOMEN PELVIS W CONTRAST Result Date: 08/02/2023 EXAM: CT ABDOMEN AND PELVIS WITH CONTRAST 08/02/2023 09:22:06 PM TECHNIQUE: CT of the abdomen and pelvis was performed with the administration of intravenous contrast. Multiplanar reformatted images are provided for review. Automated exposure control, iterative reconstruction, and/or weight based adjustment of the mA/kV was utilized to reduce the radiation dose to as low as reasonably achievable. COMPARISON: 06/21/2023 CLINICAL HISTORY: Abdominal pain, acute, nonlocalized; Bowel obstruction suspected; stage 4 colon cancer, abdominal pain, unable to defecate. FINDINGS: LOWER CHEST: No acute abnormality. LIVER: Scattered right hepatic metastases measuring up to 18 mm and fairly in the right hepatic lobe, progressive. Mild central intrahepatic ductal dilatation, mildly progressive, with two indwelling common duct stents. GALLBLADDER AND BILE DUCTS: Gallbladder is unremarkable. No biliary ductal dilatation. SPLEEN: No acute abnormality. PANCREAS: No acute abnormality. ADRENAL GLANDS: No  acute abnormality. KIDNEYS, URETERS AND BLADDER: 18 mm simple left lower pole renal cyst (image 38 ), benign (Bosniak 1 ). No follow up is recommended. Bilateral nonobstructing renal calculi measuring up to 10 mm in the left upper kidney and right lower kidney. No hydronephrosis. No perinephric or periureteral stranding. Urinary bladder is unremarkable. GI AND BOWEL: Suspected soft tissue mass along the mid rectum with adjacent peritoneal nodularity along the posterior uterus, favored to be mildly progressive. PERITONEUM AND RETROPERITONEUM: Trace perihepatic ascites. Scattered peritoneal disease including omental caking beneath the right anterior abdominal wall, grossly unchanged. Small volume pelvic ascites, with a 6.6 cm loculated fluid at the lesion in the left deep pelvis, mildly progressive. VASCULATURE: Aorta is normal in caliber. LYMPH NODES: No lymphadenopathy. REPRODUCTIVE ORGANS: Soft tissue nodularity along the posterior uterus, as above. BONES AND SOFT TISSUES: No acute osseous abnormality. No focal soft tissue abnormality. IMPRESSION: 1. Mid rectal soft tissue mass, mildly progressive. 2. Right hepatic metastases, progressive. 3. Peritoneal disease/omental caking, stable versus mildly progressive. 4. Small volume abdominopelvic ascites, mildly progressive. Electronically signed by: Pinkie Pebbles MD 08/02/2023 09:40 PM EDT RP Workstation: [id number]  Mr Barwick was interviewed and examined.  She reports improvement in pain after having multiple bowel movements yesterday.  She would like to proceed with chemotherapy today.  We again reviewed potential toxicities associated with the FOLFOX regimen including the chance of an allergic reaction and neuropathy.  The Guardant360 testing from [facility] returned with a K-ras G 12D mutation.  She will not be a candidate for panitumumab .  I will contact the [facility] providers to see whether she will be eligible for a K-ras directed clinical trial.

## 2023-08-05 NOTE — Progress Notes (Signed)
 PMT  progress note  Awake Alert Undergoing chemotherapy, tolerating well so far Pain level 6/10, asks for IV Dilaudid  PRN Did have several  bowel movements over the past 24 hours.  Medication history noted Oncology note reviewed BP (!) 127/96 (BP Location: Right Arm)   Pulse 72   Temp 97.9 F (36.6 C) (Oral)   Resp 18   Ht 5' 4 (1.626 m)   Wt 56.7 kg   LMP 01/07/2006   SpO2 100%   BMI 21.46 kg/m    April [name] is a 48 year old female patient with oncologic history significant for sigmoid colon cancer, stage IV.  She was admitted 08/02/2023 due to worsening abdominal pain.  PMT consulted for Worsening abdominal pain,Cancer associated pain Constipation GI consultation for a sigmoidoscopy and possible rectal stent is being considered.  There is evidence of progressive hepatic and peritoneal metastatic disease. There is a persistent rectal mass.  Plan: Pain management: IV Dilaudid  PRN, PO Dilaudid  PRN, scheduled OxyContin  PO Q 8 hours.  Bowel regimen: miralax , lactulose , senna. Continue to monitor. mod MDM Lonia Serve MD [facility]

## 2023-08-06 ENCOUNTER — Inpatient Hospital Stay

## 2023-08-06 ENCOUNTER — Encounter: Payer: Self-pay | Admitting: *Deleted

## 2023-08-06 ENCOUNTER — Ambulatory Visit: Admitting: Oncology

## 2023-08-06 ENCOUNTER — Encounter: Payer: Self-pay | Admitting: Oncology

## 2023-08-06 ENCOUNTER — Encounter

## 2023-08-06 DIAGNOSIS — C189 Malignant neoplasm of colon, unspecified: Secondary | ICD-10-CM | POA: Diagnosis not present

## 2023-08-06 DIAGNOSIS — C787 Secondary malignant neoplasm of liver and intrahepatic bile duct: Secondary | ICD-10-CM | POA: Diagnosis not present

## 2023-08-06 LAB — BASIC METABOLIC PANEL WITH GFR
Anion gap: 10 (ref 5–15)
BUN: 10 mg/dL (ref 6–20)
CO2: 27 mmol/L (ref 22–32)
Calcium: 9.2 mg/dL (ref 8.9–10.3)
Chloride: 93 mmol/L — ABNORMAL LOW (ref 98–111)
Creatinine, Ser: 0.48 mg/dL (ref 0.44–1.00)
GFR, Estimated: >60 mL/min (ref >60)
Glucose, Bld: 136 mg/dL — ABNORMAL HIGH (ref 70–99)
Potassium: 3.7 mmol/L (ref 3.5–5.1)
Sodium: 130 mmol/L — ABNORMAL LOW (ref 135–145)

## 2023-08-06 LAB — CBC
HCT: 33.8 % — ABNORMAL LOW (ref 36.0–46.0)
Hemoglobin: 10.6 g/dL — ABNORMAL LOW (ref 12.0–15.0)
MCH: 27.3 pg (ref 26.0–34.0)
MCHC: 31.4 g/dL (ref 30.0–36.0)
MCV: 87.1 fL (ref 80.0–100.0)
Platelets: 442 K/uL — ABNORMAL HIGH (ref 150–400)
RBC: 3.88 MIL/uL (ref 3.87–5.11)
RDW: 15.9 % — ABNORMAL HIGH (ref 11.5–15.5)
WBC: 9.2 K/uL (ref 4.0–10.5)
nRBC: 0 % (ref 0.0–0.2)

## 2023-08-06 LAB — MAGNESIUM: Magnesium: 1.9 mg/dL (ref 1.7–2.4)

## 2023-08-06 MED ORDER — LIDOCAINE HCL URETHRAL/MUCOSAL 2 % EX GEL
1.0000 | Freq: Every day | CUTANEOUS | Status: DC | PRN
  Administered 2023-08-06 – 2023-08-08 (×2): 1 via TOPICAL
  Filled 2023-08-06 (×3): qty 5

## 2023-08-06 MED ORDER — HYDROCORTISONE ACETATE 25 MG RE SUPP
25.0000 mg | Freq: Two times a day (BID) | RECTAL | Status: DC
  Administered 2023-08-06 – 2023-08-11 (×3): 25 mg via RECTAL
  Filled 2023-08-06 (×13): qty 1

## 2023-08-06 MED ORDER — HYDRALAZINE HCL 25 MG PO TABS
25.0000 mg | ORAL_TABLET | Freq: Three times a day (TID) | ORAL | Status: DC
  Administered 2023-08-06 – 2023-08-09 (×7): 25 mg via ORAL
  Filled 2023-08-06 (×7): qty 1

## 2023-08-06 NOTE — Plan of Care (Signed)

## 2023-08-06 NOTE — Progress Notes (Addendum)
   08/06/23 1532  Pain Assessment  Pain Scale 0-10  Pain Score 10  Pain Type Acute pain  Pain Location Abdomen  Pain Orientation Mid  Pain Descriptors / Indicators Pressure  Pain Frequency Constant  Pain Onset Progressive  Patients Stated Pain Goal 5  Pain Intervention(s) Medication (See eMAR)  Provider Notification  Provider Name/Title DR.Pakrol  Date Provider Notified 08/06/23  Time Provider Notified 1545  Method of Notification Page  Notification Reason Change in status (increased abdominal pain)  Provider response No new orders  Date of Provider Response 08/06/23  Time of Provider Response 1600   Pt refused lactulose  twice during shift.abdomen distended and painful to touch.IV dilaudid  given.No bowel movement today.Dr.Pokhrel made aware and recommended encouraging pt to take lactulose .Pt agreed to take next dose at 1800.Pt reports relief after Iv dilaudid .

## 2023-08-06 NOTE — Progress Notes (Signed)
 Per Dr. Cloretta, scheduling message sent to schedule next lab/flush/OV and chemo 2 weeks from 7/23.  MyChart message sent to patient to get back with us  on the date of her PICC dressing change and last flush so these appointments can be arranged.

## 2023-08-06 NOTE — Progress Notes (Signed)
 PROGRESS NOTE  April [name] FMW:[id number] DOB: [birth year] DOA: 08/02/2023 PCP: Oris Camie BRAVO, NP   LOS: 3 days   Brief narrative:  April [name] is a 48 y.o. 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  In the ED, blood pressure was elevated.  Labs were notable for AST of 48 alkaline phosphatase 661.  Lipase 35.  Lactate of 1.2.  Urinalysis showed negative nitrate but more than 50 RBCs.  CT scan done showed mild rectal soft tissue mass with right hepatic metastasis peritoneal disease and omental caking with a small volume abdominal pelvic ascites.  Patient was then admitted to our hospital for further evaluation and treatment.  During hospitalization patient has been seen by palliative care and oncology.  Has been started on inpatient chemotherapy since 08/05/2023.  Assessment/Plan: Principal Problem:   Metastatic colon cancer to liver [facility]) Active Problems:   Cancer associated pain   Hypertensive urgency   Peritoneal carcinomatosis (HCC)   Goals of care, counseling/discussion   Constipation   Intractable pain  Stage IV sigmoid colon cancer with metastasis and  CBD obstruction Status post robotic low anterior resection in 2021.  History of chemotherapy in the past.  Status post bilateral hepatic stents on 02/26/2023 and GI had recommended follow-up in 6 to 12 months for stent exchange.  At this time oncology has seen the patient and oncology been started on chemotherapy since 08/05/2023.  Patient complains of mild nausea from chemotherapy today.  Intractable abdominal pain.  Has severe constipation.  Currently on MiraLAX  twice daily and Senokot twice daily but does not wish to be on MiraLAX .  She wishes  to continue Senokot for now.  Bowel movements at this time but complains of some rectal pain when has been having rectal pain while putting pressure during evacuation.  Advised her to make sure that her bowels are soft, will add Lidoderm  jelly to the rectal area..     Cancer associated pain On IV and oral pain medications.  Palliative care on board.  Currently on Dilaudid  orally and IV with long-acting OxyContin .  States that this regimen in the hospital works for her but at home medication regimen does not really work.  Hypertensive urgency Likely secondary to underlying pain.  Continue amlodipine , clonidine , metoprolol  and as needed hydralazine .  Blood pressure still elevated especially diastolic pressure.  Will add hydralazine  25 p.o. Q8 hourly for now for better control of blood pressure.   Hypokalemia Replenished and improved.  Latest potassium of 3.67   Bilateral renal calculi - Nonobstructive in nature.  Continue to monitor.   Normocytic anemia   Likely anemia of chronic disease in the setting of metastatic cancer.  Latest hemoglobin of 10.6 from 9.8   Leukocytosis and thrombocytosis Likely reactive.  No fever.  Blood cultures urine cultures negative in 2 days.  Leukocytosis has resolved.  Still with mild thrombocytosis.  DVT prophylaxis: enoxaparin  (LOVENOX ) injection 40 mg Start: 08/03/23 1000   Disposition: Home when okay with oncology and palliative care.  Status is: Inpatient Remains inpatient appropriate because: Pending clinical improvement, intractable pain, initiation of inpatient chemotherapy    Code Status:     Code Status: Full Code  Family Communication: Patient's family at bedside  Consultants: Palliative care Oncology  Procedures: Initiation of chemotherapy  Anti-infectives:  None  Anti-infectives (From admission, onward)    None        Subjective: Today, patient was seen and examined at bedside.  States that her pain is okay on the current  medication regimen.  Has had some nausea.  States that she does have some rectal pressure while moving her bowels and does not wish to be on laxatives but okay with stool softeners.  States that her bowels have been soft.  Requests something for pain on the rectal area.  Objective: Vitals:   08/05/23 2052 08/06/23 0424  BP: (!) 152/107 (!) 134/103  Pulse: 73 77  Resp: 20 18  Temp: 98 F (36.7 C) 98.1 F (36.7 C)  SpO2: 100% 100%    Intake/Output Summary (Last 24 hours) at 08/06/2023 1022 Last data filed at 08/06/2023 0100 Gross per 24 hour  Intake 1175.35 ml  Output --  Net 1175.35 ml   Filed Weights   08/02/23 2014 08/03/23 0009  Weight: 54.9 kg 56.7 kg   Body mass index is 21.46 kg/m.   Physical Exam: GENERAL: Patient is alert awake and oriented. Not in obvious distress.  Communicative.  Thinly built. HENT: Pallor noted.  Pupils equally reactive to light. Oral mucosa is moist NECK: is supple, no gross swelling noted. CHEST: Clear to auscultation. No crackles or wheezes.   CVS: S1 and S2 heard, no murmur. Regular rate and rhythm.  ABDOMEN: Soft, tenderness of the light lower abdomen with mild distention, bowel sounds are present. EXTREMITIES: No edema.  Left upper extremity PICC line in place. CNS: Cranial nerves are intact. No focal motor deficits. SKIN: warm and dry without rashes.  Data Review: I have personally reviewed the following laboratory data and studies,  CBC: Recent Labs  Lab 07/30/23 1027 07/30/23 1027 08/02/23 2032 08/02/23 2041 08/03/23 0044 08/04/23 1506 08/05/23 0204 08/06/23 0411  WBC 10.7*  --  12.6*  --  13.9* 10.2 9.3 9.2  NEUTROABS 6.7  --  8.1*  --   --  6.4  --   --   HGB 12.0   < > 10.4* 11.2* 9.9* 9.9* 9.8* 10.6*  HCT 37.1  --  33.3* 33.0* 32.5* 31.5* 32.2* 33.8*  MCV 85.3  --  87.9  --  89.0 89.0 89.0 87.1  PLT 419*  --  415*  --  407* 411* 404* 442*   < > = values in this interval not displayed.   Basic Metabolic Panel: Recent  Labs  Lab 07/30/23 1027 07/30/23 1027 08/02/23 2032 08/02/23 2041 08/03/23 0044 08/04/23 1506 08/05/23 0204 08/06/23 0411  NA 137  --  137 137 139 135 134* 130*  K 2.9*  --  3.8 4.3 3.1* 3.8 3.6 3.7  CL 93*  --  95* 96* 96* 93* 95* 93*  CO2 30  --  31  --  31 30 27 27   GLUCOSE 114*  --  103* 104* 103* 109* 158* 136*  BUN 8  --  8 8 7 6 6 10   CREATININE 0.50   < > 0.72 0.80 0.64 0.69 0.51 0.48  CALCIUM  9.9  --  9.0  --  9.0 9.0 8.7* 9.2  MG 1.9  --  2.0  --  1.9  --   --  1.9   < > = values in this interval not displayed.   Liver Function Tests: Recent Labs  Lab 07/30/23 1027 08/02/23 2032  AST 53* 48*  ALT 16 16  ALKPHOS 971* 661*  BILITOT 0.4 0.6  PROT 8.6* 7.7  ALBUMIN 4.0 3.0*   Recent Labs  Lab 08/02/23 2032  LIPASE 35   No results for input(s): AMMONIA in the last 168 hours. Cardiac Enzymes: No results for input(s): CKTOTAL, CKMB, CKMBINDEX, TROPONINI in the last 168 hours. BNP (last 3 results) No results for input(s): BNP in the last 8760 hours.  ProBNP (last 3 results) No results for input(s): PROBNP in the last 8760 hours.  CBG: No results for input(s): GLUCAP in the last 168 hours. Recent Results (from the past 240 hours)  Blood culture (routine x 2)     Status: None (Preliminary result)   Collection Time: 08/02/23  9:10 PM   Specimen: BLOOD  Result Value Ref Range Status   Specimen Description   Final    BLOOD BLOOD RIGHT HAND Performed at [facility], [address] [place], [place] [address]    Special Requests   Final    BOTTLES DRAWN AEROBIC AND ANAEROBIC Blood Culture adequate volume Performed at [facility], [address] [place], [place] [address]    Culture   Final    NO GROWTH 3 DAYS Performed at [facility], [address] [place], [place] [address]    Report Status PENDING  Incomplete  Blood culture (routine x 2)     Status: None (Preliminary result)   Collection  Time: 08/02/23  9:35 PM   Specimen: BLOOD  Result Value Ref Range Status   Specimen Description   Final    BLOOD BLOOD LEFT HAND Performed at [facility] [address], [place], [place] [address]    Special Requests   Final    BOTTLES DRAWN AEROBIC AND ANAEROBIC Blood Culture adequate volume Performed at [facility], [address], [place], [place] [address]    Culture   Final    NO GROWTH 2 DAYS Performed at [facility], [address] [place], [place] [address]    Report Status PENDING  Incomplete  Urine Culture     Status: Abnormal   Collection Time: 08/02/23 10:41 PM   Specimen: Urine, Clean Catch  Result Value Ref Range Status   Specimen Description   Final    URINE, CLEAN CATCH Performed at [facility], [address] [place], [place] [address]    Special Requests   Final    Immunocompromised Performed at [facility], [address] [place], [place] [address]    Culture (A)  Final    <10,000 COLONIES/mL INSIGNIFICANT GROWTH Performed at [facility], [address], [place], [place] [address]    Report Status 08/04/2023 FINAL  Final     Studies: No results found.    Vernal Alstrom, MD  [facility] 08/06/2023  If 7PM-7AM, please contact night-coverage

## 2023-08-06 NOTE — Progress Notes (Addendum)
 April [name]   DOB:1975-05-01   FM#:[id number]      ASSESSMENT & PLAN:  April [name] is a 48 year old female patient with oncologic history significant for sigmoid colon cancer, stage IV.  She was admitted 08/02/2023 due to worsening abdominal pain.  Follows with Dr. Cloretta.   Worsening abdominal pain Cancer associated pain Constipation - Admitted 08/02/2023 due to worsening abdominal pain, still complaining of ongoing pain. - Secondary to malignancy - Continues to have good bowel movements, complains of hemorrhoids.  -- Continue laxative regimen per Palliative recommendations.   - Encourage fluids and hydration - Continue judicious pain management. - Palliative following closely   Sigmoid colon cancer stage IV (pT4a,pN2b,M1c)  - Diagnosed 2021 -Status post robotic assisted low anterior resection, mesenteric lymphadenectomy, bilateral salpingo-- oophorectomy in 2021. - Status post FOLFIRI plus bevacizumab  initiated 05/24/2019.  Bevacizumab  held from cycle 5.  Completed cycle 11 12/26/2019. - Restarted on FOLFIRI plus panitumumab  09/19/2020, completed cycle 15 06/17/2021. - Started on cycle 1 Lonsurf  and Avastin  10/14/2021.  Cycle 6 completed 03/03/2022. - Subsequently given cycle 1 FOLFOX for 02/01/2022, cycle 11 given 09/22/2022.  Patient then declined further treatment. - Restarted patient on irinotecan  plus panitumumab  01/06/2023 with cycle 8 given 06/19/2023.  Irinotecan  discontinued 07/10/2023 due to lack of clinical benefit and poor tolerance. - Currently off treatment.  The plan was to begin treatment with recycled FOLFOX +/- panitumumab  depending on results of NGS testing.  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 -- Will consider palliative radiation to the rectum if her pain persists.   -- Status pos FOLFOX chemo regimen on 7/23, tolerated  well with some mild nausea relieved with anti-emetics.  Monitor closely. -- Okay for discharge home with close follow-up with medical oncology/Dr. Cloretta.   Anemia, normocytic - Hemoglobin stable 10.6  - Likely multifactorial due to malignancy, recent oncologic therapy, poor nutrition - Recommend PRBC transfusion for hemoglobin <7.0 - Continue to monitor CBC with differential   Thrombocytosis - Likely reactive, platelets slightly elevated 442K - Continue to monitor        Code Status Full  Subjective:  Patient seen awake and alert laying in bed. Reports that she had a rough night, hemorrhoids were bothering her a lot and she had some nausea.  Nausea now much better after anti-emetics.  Also requesting brand Dilaudid  rx upon discharge as states generic does not work well.  No other complaints offered.   Objective:   Intake/Output Summary (Last 24 hours) at 08/06/2023 0855 Last data filed at 08/06/2023 0100 Gross per 24 hour  Intake 1486.15 ml  Output --  Net 1486.15 ml     PHYSICAL EXAMINATION: ECOG PERFORMANCE STATUS: 2 - Symptomatic, <50% confined to bed  Vitals:   08/05/23 2052 08/06/23 0424  BP: (!) 152/107 (!) 134/103  Pulse: 73 77  Resp: 20 18  Temp: 98 F (36.7 C) 98.1 F (36.7 C)  SpO2: 100% 100%   Filed Weights   08/02/23 2014 08/03/23 0009  Weight: 121 lb (54.9 kg) 125 lb (56.7 kg)    GENERAL: alert, no distress and comfortable +thin appearing SKIN: skin color, texture, turgor are normal, no rashes or significant lesions EYES: normal, conjunctiva are pink and non-injected, sclera clear OROPHARYNX: no exudate, no erythema and lips, buccal mucosa, and tongue normal  NECK: supple, thyroid normal size, non-tender, without nodularity LYMPH:  no palpable lymphadenopathy in the cervical, axillary or inguinal LUNGS: clear to auscultation and percussion with normal breathing effort HEART: regular rate & rhythm and no murmurs and no lower extremity  edema ABDOMEN: abdomen soft, +slightly distended abdomen  +external rectal hemorrhoids noted MUSCULOSKELETAL: no cyanosis of digits and no clubbing  PSYCH: alert & oriented x 3 with fluent speech NEURO: no focal motor/sensory deficits   All questions were answered. The patient knows to call the clinic with any problems, questions or concerns.   The total time spent in the appointment was 40 minutes encounter with patient including review of chart and various tests results, discussions about plan of care and coordination of care plan  Olam JINNY Brunner, NP 08/06/2023 8:55 AM    Labs Reviewed:  Lab Results  Component Value Date   WBC 9.2 08/06/2023   HGB 10.6 (L) 08/06/2023   HCT 33.8 (L) 08/06/2023   MCV 87.1 08/06/2023   PLT 442 (H) 08/06/2023   Recent Labs    11/21/22 0557 11/22/22 0710 12/16/22 0505 12/17/22 0500 06/21/23 0539 06/22/23 0415 07/10/23 0920 07/30/23 1027 08/02/23 2032 08/02/23 2041 08/04/23 1506 08/05/23 0204 08/06/23 0411  NA 138   < > 132*   < > 133*   < > 138 137 137   < > 135 134* 130*  K 3.5   < > 2.9*   < > 3.4*   < > 3.2* 2.9* 3.8   < > 3.8 3.6 3.7  CL 101   < > 97*   < > 95*   < > 98 93* 95*   < > 93* 95* 93*  CO2 24   < > 29   < > 29   < > 32 30 31   < > 30 27 27   GLUCOSE 139*   < > 97   < > 83   < > 80 114* 103*   < > 109* 158* 136*  BUN 9   < > 8   < > 7   < > 6 8 8    < > 6 6 10   CREATININE 0.58   < > 0.52   < > 0.37*   < > 0.40* 0.50 0.72   < > 0.69 0.51 0.48  CALCIUM  10.3   < > 8.5*   < > 8.5*   < > 9.3 9.9 9.0   < > 9.0 8.7* 9.2  GFRNONAA >60   < > >60   < > >60   < > >60 >60 >60   < > >60 >60 >60  PROT 8.9*   < > 7.1   < > 6.9  --  7.1 8.6* 7.7  --   --   --   --   ALBUMIN 4.4  4.5   < > 3.2*   < > 2.6*  --  3.3* 4.0 3.0*  --   --   --   --   AST 35   < > 37   < > 25  --  60* 53* 48*  --   --   --   --   ALT 24   < > 28   < > 16  --  32 16 16  --   --   --   --   ALKPHOS 172*   < > 204*   < > 606*  --  785* 971* 661*  --   --   --   --  BILITOT 0.6   < > 0.5   < > 0.7  --  0.3 0.4 0.6  --   --   --   --   BILIDIR <0.1  --  0.1  --  <0.1  --   --   --   --   --   --   --   --   IBILI NOT CALCULATED  --  0.4  --  NOT CALCULATED  --   --   --   --   --   --   --   --    < > = values in this interval not displayed.    Studies Reviewed:  Abd 1 View (KUB) Result Date: 08/03/2023 CLINICAL DATA:  48 year old female with constipation. Metastatic colon cancer. EXAM: ABDOMEN - 1 VIEW COMPARISON:  CT Abdomen and Pelvis last night. FINDINGS: Supine AP view of the lower abdomen and pelvis at 0509 hours. Excreted IV contrast now in the urinary bladder. Plastic biliary stents redemonstrated. Nephrolithiasis redemonstrated. Bowel-gas pattern unchanged from the CT Abdomen and Pelvis last night. Stable visualized osseous structures. IMPRESSION: 1. Unchanged bowel-gas pattern from CT last night. 2. Redemonstrated biliary stents, nephrolithiasis. Electronically Signed   By: VEAR Hurst M.D.   On: 08/03/2023 05:50   CT ABDOMEN PELVIS W CONTRAST Result Date: 08/02/2023 EXAM: CT ABDOMEN AND PELVIS WITH CONTRAST 08/02/2023 09:22:06 PM TECHNIQUE: CT of the abdomen and pelvis was performed with the administration of intravenous contrast. Multiplanar reformatted images are provided for review. Automated exposure control, iterative reconstruction, and/or weight based adjustment of the mA/kV was utilized to reduce the radiation dose to as low as reasonably achievable. COMPARISON: 06/21/2023 CLINICAL HISTORY: Abdominal pain, acute, nonlocalized; Bowel obstruction suspected; stage 4 colon cancer, abdominal pain, unable to defecate. FINDINGS: LOWER CHEST: No acute abnormality. LIVER: Scattered right hepatic metastases measuring up to 18 mm and fairly in the right hepatic lobe, progressive. Mild central intrahepatic ductal dilatation, mildly progressive, with two indwelling common duct stents. GALLBLADDER AND BILE DUCTS: Gallbladder is unremarkable. No biliary ductal  dilatation. SPLEEN: No acute abnormality. PANCREAS: No acute abnormality. ADRENAL GLANDS: No acute abnormality. KIDNEYS, URETERS AND BLADDER: 18 mm simple left lower pole renal cyst (image 38 ), benign (Bosniak 1 ). No follow up is recommended. Bilateral nonobstructing renal calculi measuring up to 10 mm in the left upper kidney and right lower kidney. No hydronephrosis. No perinephric or periureteral stranding. Urinary bladder is unremarkable. GI AND BOWEL: Suspected soft tissue mass along the mid rectum with adjacent peritoneal nodularity along the posterior uterus, favored to be mildly progressive. PERITONEUM AND RETROPERITONEUM: Trace perihepatic ascites. Scattered peritoneal disease including omental caking beneath the right anterior abdominal wall, grossly unchanged. Small volume pelvic ascites, with a 6.6 cm loculated fluid at the lesion in the left deep pelvis, mildly progressive. VASCULATURE: Aorta is normal in caliber. LYMPH NODES: No lymphadenopathy. REPRODUCTIVE ORGANS: Soft tissue nodularity along the posterior uterus, as above. BONES AND SOFT TISSUES: No acute osseous abnormality. No focal soft tissue abnormality. IMPRESSION: 1. Mid rectal soft tissue mass, mildly progressive. 2. Right hepatic metastases, progressive. 3. Peritoneal disease/omental caking, stable versus mildly progressive. 4. Small volume abdominopelvic ascites, mildly progressive. Electronically signed by: Pinkie Pebbles MD 08/02/2023 09:40 PM EDT RP Workstation: [id number]  Ms. Bhardwaj was interviewed and examined.  She began a cycle of salvage FOLFOX chemotherapy yesterday.  No symptom of allergic reaction.  No neuropathy symptoms.  She complains of nausea and persistent abdominal pain.  She will  complete the 5-fluorouracil  infusion tomorrow.  She can be discharged to home after completion of the 5-FU if she is ambulatory and the pain and nausea are adequately controlled. Recommendations: Continue OxyContin  and oral  Dilaudid  for pain, try to use oral Dilaudid  preferentially Continue bowel regimen, wean as tolerated per the palliative care service Can discharge to home after completion of chemotherapy 08/07/2023 from an oncology standpoint Outpatient follow-up will be scheduled Cancer center

## 2023-08-07 DIAGNOSIS — R52 Pain, unspecified: Principal | ICD-10-CM

## 2023-08-07 DIAGNOSIS — C189 Malignant neoplasm of colon, unspecified: Secondary | ICD-10-CM | POA: Diagnosis not present

## 2023-08-07 DIAGNOSIS — C787 Secondary malignant neoplasm of liver and intrahepatic bile duct: Secondary | ICD-10-CM | POA: Diagnosis not present

## 2023-08-07 LAB — CULTURE, BLOOD (ROUTINE X 2)
Culture: NO GROWTH
Special Requests: ADEQUATE

## 2023-08-07 LAB — CBC WITH DIFFERENTIAL/PLATELET
Abs Immature Granulocytes: 0.02 K/uL (ref 0.00–0.07)
Basophils Absolute: 0.1 K/uL (ref 0.0–0.1)
Basophils Relative: 1 %
Eosinophils Absolute: 0.3 K/uL (ref 0.0–0.5)
Eosinophils Relative: 4 %
HCT: 32.2 % — ABNORMAL LOW (ref 36.0–46.0)
Hemoglobin: 10.1 g/dL — ABNORMAL LOW (ref 12.0–15.0)
Immature Granulocytes: 0 %
Lymphocytes Relative: 16 %
Lymphs Abs: 1.2 K/uL (ref 0.7–4.0)
MCH: 27.5 pg (ref 26.0–34.0)
MCHC: 31.4 g/dL (ref 30.0–36.0)
MCV: 87.7 fL (ref 80.0–100.0)
Monocytes Absolute: 0.5 K/uL (ref 0.1–1.0)
Monocytes Relative: 7 %
Neutro Abs: 5.4 K/uL (ref 1.7–7.7)
Neutrophils Relative %: 72 %
Platelets: 400 K/uL (ref 150–400)
RBC: 3.67 MIL/uL — ABNORMAL LOW (ref 3.87–5.11)
RDW: 16.3 % — ABNORMAL HIGH (ref 11.5–15.5)
WBC: 7.5 K/uL (ref 4.0–10.5)
nRBC: 0 % (ref 0.0–0.2)

## 2023-08-07 LAB — BASIC METABOLIC PANEL WITH GFR
Anion gap: 8 (ref 5–15)
BUN: 9 mg/dL (ref 6–20)
CO2: 27 mmol/L (ref 22–32)
Calcium: 8.5 mg/dL — ABNORMAL LOW (ref 8.9–10.3)
Chloride: 96 mmol/L — ABNORMAL LOW (ref 98–111)
Creatinine, Ser: 0.46 mg/dL (ref 0.44–1.00)
GFR, Estimated: >60 mL/min (ref >60)
Glucose, Bld: 105 mg/dL — ABNORMAL HIGH (ref 70–99)
Potassium: 3.6 mmol/L (ref 3.5–5.1)
Sodium: 131 mmol/L — ABNORMAL LOW (ref 135–145)

## 2023-08-07 LAB — PHOSPHORUS: Phosphorus: 3.6 mg/dL (ref 2.5–4.6)

## 2023-08-07 LAB — MAGNESIUM: Magnesium: 2.1 mg/dL (ref 1.7–2.4)

## 2023-08-07 MED ORDER — NALOXONE HCL 0.4 MG/ML IJ SOLN: 0.4000 mg | INTRAMUSCULAR | Status: DC | PRN

## 2023-08-07 MED ORDER — SODIUM CHLORIDE 0.9% FLUSH: 9.0000 mL | INTRAVENOUS | Status: DC | PRN

## 2023-08-07 MED ORDER — DIPHENHYDRAMINE HCL 12.5 MG/5ML PO ELIX: 12.5000 mg | ORAL_SOLUTION | Freq: Four times a day (QID) | ORAL | Status: DC | PRN

## 2023-08-07 MED ORDER — DIPHENHYDRAMINE HCL 50 MG/ML IJ SOLN: 12.5000 mg | Freq: Four times a day (QID) | INTRAMUSCULAR | Status: DC | PRN

## 2023-08-07 MED ORDER — HYDROMORPHONE 1 MG/ML IV SOLN
INTRAVENOUS | Status: DC
  Administered 2023-08-07 – 2023-08-08 (×3): 30 mg via INTRAVENOUS
  Administered 2023-08-08: 4.52 mg via INTRAVENOUS
  Administered 2023-08-08 – 2023-08-09 (×2): 30 mg via INTRAVENOUS
  Filled 2023-08-07 (×5): qty 30

## 2023-08-07 NOTE — Progress Notes (Addendum)
 Daily Progress Note   Patient Name: April [name]       Date: 08/07/2023 DOB: 1975/02/07  Age: 48 y.o. MRN#: [id number] Attending Physician: Bryn Bernardino NOVAK, MD Primary Care Physician: Oris Camie BRAVO, NP Admit Date: 08/02/2023  Reason for Consultation/Follow-up: Pain control  Subjective:  April [name] is a 48 y.o. 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 She was admitted 08/02/2023 for uncontrolled cancer pain.  PMT has been following for symptom management.  Oncology is also following.  She is currently receiving inpatient chemotherapy.  Initiation date 08/05/2023.  Today, patient admits to being in ongoing and severe pain.  She states that her pain typically ranges in the 8-10 range.  The lowest it gets is a 5 out of 10.  Her oral pain medication is not helping.  The only thing that helps is her IV pain medicine and that only helps get the pain to a 5 it only for short duration.  She states the pain is interfering with her sleep.  Localizes the pain to her umbilical area and lower abdomen.  She tries to reposition frequently but cannot get comfortable.  She states that she may gets nauseated when the pain is very severe but otherwise denies any nausea or vomiting.  She has a decreased appetite related as she states her pain worsens after eating.  She refused breakfast today but plans to try to eat a little bit later on.  No sedation reported she actually has trouble sleeping due to the pain.  She does report some intermittent constipation.  States she was constipated and then received lactulose  which caused her to have frequent bowel movements.  Currently states her last BM was yesterday.  Emphasized  the importance of continuing to take her bowel regimen in order to maintain regular BMs as this will also contribute to abdominal pain if constipated.  She agrees to continue senna and MiraLAX  and with lactulose  as needed.  We discussed prior experiences with her uncontrolled pain that she knows that she has had a PCA in the past which was very helpful for her.   Chart review/care coordination:  Completed extensive chart review including EPIC notes, medication administration record, labs (mild and progressive hyponatremia, stable renal function, mild hypocalcemia, mild anemia-stable), CT abdomen pelvis independently reviewed hepatic mets present, soft tissue mass present in rectum.  Mild ascites. Coordinated care with admitting provider, nursing staff, pharmacy.   Length of Stay: 4   Physical Exam Constitutional:      Appearance: She is ill-appearing.     Comments: Laying across the bed with feet off bed, indicates this is the only way she can lay without being in severe pain, she appears very uncomfortable, tearful but pleasant and cooperative  Pulmonary:     Effort: Pulmonary effort is normal. No respiratory distress.  Neurological:     Mental Status: She is alert.             Vital Signs: BP (!) 118/95 (BP Location: Right Arm)   Pulse 80   Temp 98.2 F (36.8 C) (Oral)   Resp 19  Ht 5' 4 (1.626 m)   Wt 56.7 kg   LMP 01/07/2006   SpO2 100%   BMI 21.46 kg/m  SpO2: SpO2: 100 % O2 Device: O2 Device: Room Air O2 Flow Rate:        Palliative Assessment/Data: 50-60% (pain limits mobility, appetite diminished due to pain)   Palliative Care Assessment & Plan   Patient Profile/Assessment:  48 year old female with metastatic stage IV colorectal cancer admitted to the hospital for pain management.  Receiving inpatient chemotherapy.  Some progression of disease noted on imaging.  Being followed by oncology.  She continues to have severe pain and requires frequent doses of short  acting pain medication.  Suboptimal response on current pain regimen.  She has tolerated PCA in the past with improved pain control.  She is agreeable to Dilaudid  PCA for further pain management.  Extensive review of MAR.  Calculated total opioid dose for the past 24 hours and performed opioid conversion and will plan to initiate Dilaudid  PCA. Collaborated with pharmacy for dosing.    Recommendations/Plan/Symptom management: Discontinue scheduled Oxy and both oral and IV Dilaudid  PRN Initiate Dilaudid  PCA at 1.2 mg continuous with 0.6 mg bolus every 10 minutes as needed Continue scheduled senna and MiraLAX  Continue lactulose  as needed Continue Phenergan  as needed Would benefit from further goals of care discussion but deferred at this time until symptoms are better managed Palliative team to continue to follow and adjust regimen as indicated   Prognosis:  Guarded    Discharge Planning: To Be Determined    Detailed review of medical records (labs, imaging, vital signs), medically appropriate exam, discussed with treatment team, counseling and education to patient, family, & staff, documenting clinical information, medication management, coordination of care    Billing based on MDM: High  Problems Addressed: One or more chronic illnesses with severe exacerbation, progression, or side effects of treatment.  Amount and/or Complexity of Data: Category 1:Review of the result(s) of each unique test and Category 3:Discussion of management or test interpretation with external physician/other qualified health care professional/appropriate source (not separately reported)  Risks: Parenteral controlled substances         Stokes Rattigan M Day Deery, NP  Palliative Medicine Team Team phone # [phone]  Thank you for allowing the Palliative Medicine Team to assist in the care of this patient. Please utilize secure chat with additional questions, if there is no response within 30 minutes please  call the above phone number.  Palliative Medicine Team providers are available by phone from 7am to 7pm daily and can be reached through the team cell phone.  Should this patient require assistance outside of these hours, please call the patient's attending physician.

## 2023-08-07 NOTE — Progress Notes (Addendum)
 April [name]   DOB:12-14-1975   FM#:[id number]      ASSESSMENT & PLAN:  April [name] is a 48 year old female patient with oncologic history significant for sigmoid colon cancer, stage IV.  She was admitted 08/02/2023 due to worsening abdominal pain.  Follows with Dr. Cloretta.   Abdominal pain Cancer associated pain Constipation - Admitted 08/02/2023 due to worsening abdominal pain-- patient continues to complain of abdominal pain, states worse than when she was admitted. Patient may need PCA, Palliative team made aware.  - Secondary to malignancy - Continues to have good bowel movements, complains of hemorrhoids.  -- Continue bowel regimen, wean as tolerated per the palliative care service   - Encourage fluids and hydration - Continue OxyContin  and oral Dilaudid  for pain, try to use oral Dilaudid  preferentially  - Palliative following closely   Sigmoid colon cancer stage IV (pT4a,pN2b,M1c)  - Diagnosed 2021 -Status post robotic assisted low anterior resection, mesenteric lymphadenectomy, bilateral salpingo-- oophorectomy in 2021. - Status post FOLFIRI plus bevacizumab  initiated 05/24/2019.  Bevacizumab  held from cycle 5.  Completed cycle 11 12/26/2019. - Restarted on FOLFIRI plus panitumumab  09/19/2020, completed cycle 15 06/17/2021.  Started on cycle 1 Lonsurf  and Avastin  10/14/2021.  Cycle 6 completed 03/03/2022.  Subsequently given cycle 1 FOLFOX for 02/01/2022, cycle 11 given 09/22/2022.  Patient then declined further treatment. - Restarted patient on irinotecan  plus panitumumab  01/06/2023 with cycle 8 given 06/19/2023.  Irinotecan  discontinued 07/10/2023 due to lack of clinical benefit and poor tolerance. - When patient was admitted, she was off treatment.  The plan was to begin treatment with recycled FOLFOX +/- panitumumab  depending on results of NGS testing.  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    -- Status post FOLFOX chemo regimen on 7/23, tolerated well with some mild nausea relieved with anti-emetics.  Monitor closely. -- Will consider palliative radiation to the rectum if her pain persists.   -- Patient understands no therapy will be curative. -- Medical Oncology/Dr. Cloretta following closely.    Anemia, normocytic - Hemoglobin stable 10.1  - Likely multifactorial due to malignancy, recent oncologic therapy, poor nutrition - Recommend PRBC transfusion for hemoglobin <7.0 - Continue to monitor CBC with differential   Thrombocytosis - Likely reactive, platelets improving 400K today - Continue to monitor   Hemorrhoids  --Ongoing --Continue anusol  and topical lidocaine  as ordered   Code Status Full   Subjective:  Patient awake and alert, laying facedown in a ball on her bed, states only way she can get comfortable because abdominal pain is worse than when she was first admitted. Received pain meds half hour ago. States no bowel movement since yesterday morning. Denies chest pain, or shortness of breath.   Objective:   Intake/Output Summary (Last 24 hours) at 08/07/2023 0916 Last data filed at 08/07/2023 9378 Gross per 24 hour  Intake 896.69 ml  Output --  Net 896.69 ml     PHYSICAL EXAMINATION: ECOG PERFORMANCE STATUS: 2 - Symptomatic, <50% confined to bed  Vitals:   08/06/23 2100 08/07/23 0347  BP: (!) 115/90 (!) 124/97  Pulse: 86 80  Resp: 16 16  Temp: 98.7 F (37.1 C) 98.3 F (36.8 C)  SpO2: 100% 99%   Filed Weights   08/02/23 2014 08/03/23 0009  Weight: 121 lb (54.9 kg) 125 lb (56.7 kg)    GENERAL: alert, +moderate distress due to abdominal pain SKIN: skin color, texture,  turgor are normal, no rashes or significant lesions EYES: normal, conjunctiva are pink and non-injected, sclera clear OROPHARYNX: no exudate, no erythema and lips, buccal mucosa, and tongue normal  NECK: supple, thyroid normal size, non-tender, without nodularity LYMPH: no  palpable lymphadenopathy in the cervical, axillary or inguinal LUNGS: clear to auscultation and percussion with normal breathing effort HEART: regular rate & rhythm and no murmurs and no lower extremity edema ABDOMEN: +abd distention, firm masslike fullness in the lower right lateral abdomen MUSCULOSKELETAL: no cyanosis of digits and no clubbing  PSYCH: alert & oriented x 3 with fluent speech NEURO: no focal motor/sensory deficits   All questions were answered. The patient knows to call the clinic with any problems, questions or concerns.   The total time spent in the appointment was 40 minutes encounter with patient including review of chart and various tests results, discussions about plan of care and coordination of care plan  Olam JINNY Brunner, NP 08/07/2023 9:16 AM    Labs Reviewed:  Lab Results  Component Value Date   WBC 7.5 08/07/2023   HGB 10.1 (L) 08/07/2023   HCT 32.2 (L) 08/07/2023   MCV 87.7 08/07/2023   PLT 400 08/07/2023   Recent Labs    11/21/22 0557 11/22/22 0710 12/16/22 0505 12/17/22 0500 06/21/23 0539 06/22/23 0415 07/10/23 0920 07/30/23 1027 08/02/23 2032 08/02/23 2041 08/05/23 0204 08/06/23 0411 08/07/23 0354  NA 138   < > 132*   < > 133*   < > 138 137 137   < > 134* 130* 131*  K 3.5   < > 2.9*   < > 3.4*   < > 3.2* 2.9* 3.8   < > 3.6 3.7 3.6  CL 101   < > 97*   < > 95*   < > 98 93* 95*   < > 95* 93* 96*  CO2 24   < > 29   < > 29   < > 32 30 31   < > 27 27 27   GLUCOSE 139*   < > 97   < > 83   < > 80 114* 103*   < > 158* 136* 105*  BUN 9   < > 8   < > 7   < > 6 8 8    < > 6 10 9   CREATININE 0.58   < > 0.52   < > 0.37*   < > 0.40* 0.50 0.72   < > 0.51 0.48 0.46  CALCIUM  10.3   < > 8.5*   < > 8.5*   < > 9.3 9.9 9.0   < > 8.7* 9.2 8.5*  GFRNONAA >60   < > >60   < > >60   < > >60 >60 >60   < > >60 >60 >60  PROT 8.9*   < > 7.1   < > 6.9  --  7.1 8.6* 7.7  --   --   --   --   ALBUMIN 4.4  4.5   < > 3.2*   < > 2.6*  --  3.3* 4.0 3.0*  --   --   --   --    AST 35   < > 37   < > 25  --  60* 53* 48*  --   --   --   --   ALT 24   < > 28   < > 16  --  32 16 16  --   --   --   --  ALKPHOS 172*   < > 204*   < > 606*  --  785* 971* 661*  --   --   --   --   BILITOT 0.6   < > 0.5   < > 0.7  --  0.3 0.4 0.6  --   --   --   --   BILIDIR <0.1  --  0.1  --  <0.1  --   --   --   --   --   --   --   --   IBILI NOT CALCULATED  --  0.4  --  NOT CALCULATED  --   --   --   --   --   --   --   --    < > = values in this interval not displayed.    Studies Reviewed:  Abd 1 View (KUB) Result Date: 08/03/2023 CLINICAL DATA:  48 year old female with constipation. Metastatic colon cancer. EXAM: ABDOMEN - 1 VIEW COMPARISON:  CT Abdomen and Pelvis last night. FINDINGS: Supine AP view of the lower abdomen and pelvis at 0509 hours. Excreted IV contrast now in the urinary bladder. Plastic biliary stents redemonstrated. Nephrolithiasis redemonstrated. Bowel-gas pattern unchanged from the CT Abdomen and Pelvis last night. Stable visualized osseous structures. IMPRESSION: 1. Unchanged bowel-gas pattern from CT last night. 2. Redemonstrated biliary stents, nephrolithiasis. Electronically Signed   By: VEAR Hurst M.D.   On: 08/03/2023 05:50   CT ABDOMEN PELVIS W CONTRAST Result Date: 08/02/2023 EXAM: CT ABDOMEN AND PELVIS WITH CONTRAST 08/02/2023 09:22:06 PM TECHNIQUE: CT of the abdomen and pelvis was performed with the administration of intravenous contrast. Multiplanar reformatted images are provided for review. Automated exposure control, iterative reconstruction, and/or weight based adjustment of the mA/kV was utilized to reduce the radiation dose to as low as reasonably achievable. COMPARISON: 06/21/2023 CLINICAL HISTORY: Abdominal pain, acute, nonlocalized; Bowel obstruction suspected; stage 4 colon cancer, abdominal pain, unable to defecate. FINDINGS: LOWER CHEST: No acute abnormality. LIVER: Scattered right hepatic metastases measuring up to 18 mm and fairly in the right hepatic  lobe, progressive. Mild central intrahepatic ductal dilatation, mildly progressive, with two indwelling common duct stents. GALLBLADDER AND BILE DUCTS: Gallbladder is unremarkable. No biliary ductal dilatation. SPLEEN: No acute abnormality. PANCREAS: No acute abnormality. ADRENAL GLANDS: No acute abnormality. KIDNEYS, URETERS AND BLADDER: 18 mm simple left lower pole renal cyst (image 38 ), benign (Bosniak 1 ). No follow up is recommended. Bilateral nonobstructing renal calculi measuring up to 10 mm in the left upper kidney and right lower kidney. No hydronephrosis. No perinephric or periureteral stranding. Urinary bladder is unremarkable. GI AND BOWEL: Suspected soft tissue mass along the mid rectum with adjacent peritoneal nodularity along the posterior uterus, favored to be mildly progressive. PERITONEUM AND RETROPERITONEUM: Trace perihepatic ascites. Scattered peritoneal disease including omental caking beneath the right anterior abdominal wall, grossly unchanged. Small volume pelvic ascites, with a 6.6 cm loculated fluid at the lesion in the left deep pelvis, mildly progressive. VASCULATURE: Aorta is normal in caliber. LYMPH NODES: No lymphadenopathy. REPRODUCTIVE ORGANS: Soft tissue nodularity along the posterior uterus, as above. BONES AND SOFT TISSUES: No acute osseous abnormality. No focal soft tissue abnormality. IMPRESSION: 1. Mid rectal soft tissue mass, mildly progressive. 2. Right hepatic metastases, progressive. 3. Peritoneal disease/omental caking, stable versus mildly progressive. 4. Small volume abdominopelvic ascites, mildly progressive. Electronically signed by: Pinkie Pebbles MD 08/02/2023 09:40 PM EDT RP Workstation: [id number]  Ms. Mucci was interviewed and examined.  The 5-FU infusion will be completed today.  She appears to have tolerated the chemotherapy without acute toxicity. She has increased abdominal pain despite OxyContin  and frequent hydromorphone .  We will ask Dr. Jeryl to  adjust the narcotic regimen.  We may need to increase the OxyContin  and breakthrough hydromorphone  doses, or potentially go to a PCA.  She is stable for discharge with regard to the chemotherapy, but she will need to remain in the hospital until the pain is under better control.  Recommendations: Complete 5-fluorouracil  infusion as scheduled today Adjust narcotic pain regimen per the palliative care service Bowel regimen per palliative care Please call oncology as needed, I will check on her 08/10/2023 if she remains in the hospital, outpatient follow-up will be scheduled at the Cancer center. I appreciate the care from the medicine l and palliative care services

## 2023-08-07 NOTE — Progress Notes (Signed)
 5 FU Infusion Chemo was finished. Line was flushed. Chemo wast was removed. Patient rest in bed.

## 2023-08-07 NOTE — Progress Notes (Signed)
 [facility] PROGRESS NOTE  April [name] (DOB: 26-Apr-1975) FMW:[id number] PCP: Oris Camie BRAVO, NP Outpatient Specialists: Oncology, Dr. Cloretta  Brief Narrative: April [name] is a 48 y.o. 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  In the ED, blood pressure was elevated.  Labs were notable for AST of 48 alkaline phosphatase 661.  Lipase 35.  Lactate of 1.2.  Urinalysis showed negative nitrate but more than 50 RBCs.  CT scan done showed mild rectal soft tissue mass with right hepatic metastasis peritoneal disease and omental caking with a small volume abdominal pelvic ascites.  Patient was then admitted to our hospital for further evaluation and treatment.  During hospitalization patient has been seen by palliative care and oncology.  Has been started on inpatient chemotherapy since 08/05/2023.   Subjective: Family at bedside, pt often curls up into a ball on bed with uncontrolled pain which she's having now despite getting frequent doses of IV and oral dilaudid . No vomiting, +nausea.   Objective: BP (!) 118/95 (BP Location: Right Arm)   Pulse 80   Temp 98.2 F (36.8 C) (Oral)   Resp 18   Ht 5' 4 (1.626 m)   Wt 56.7 kg   LMP 01/07/2006   SpO2 100%   BMI 21.46 kg/m   Gen: 47yo F in evident pain Pulm: Clear, nonlabored  CV: RRR GI: Limited exam due to pain Neuro: Alert and oriented. No new focal deficits. 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  History of chemotherapy in the past.  Status post bilateral hepatic stents on  02/26/2023 and GI had recommended follow-up in 6 to 12 months for stent exchange.  At this time oncology has seen the patient and oncology been started on chemotherapy since 08/05/2023.  - Complete 5-FU palliative chemotherapy 7/25.  - Continue oncology follow up after discharge, appreciate Dr. Andriette involvement.     Intractable abdominal pain: Multifactorial:  - Treat constipation aggressively with senokot, miralax , lactulose . Can apply lidocaine  jelly topically to rectum prior to BM. Anusol  supp.  - Pain uncontrolled on significant doses of oxycontin , po and IV dilaudid . D/w palliative whose help is appreciated, starting dilaudid  PCA today as below   Cancer associated pain - Start dilaudid  PCA   Hypertensive urgency: Likely secondary to underlying pain.   - Continue amlodipine , clonidine , metoprolol  and new hydralazine  25mg  q8h. BP improved on this regimen.    Hypokalemia:  - Resolved, stable.    Bilateral renal calculi: Nonobstructive in nature.  Continue to monitor.   Normocytic anemia: Hgb stable, no bleeding. Likely anemia of chronic disease in the setting of metastatic cancer.  - Typical transfusion threshold 7g/dl.    Leukocytosis and thrombocytosis: Suspected to be reactive, no fevers. Plt count now normal.  - Monitor off antimicrobial Tx for now - Blood cultures NGTD from 7/20. Urine culture insignificant growth.    Bernardino KATHEE Come, MD [facility] [email or web address] 08/07/2023, 2:11 PM

## 2023-08-08 DIAGNOSIS — C787 Secondary malignant neoplasm of liver and intrahepatic bile duct: Secondary | ICD-10-CM | POA: Diagnosis not present

## 2023-08-08 DIAGNOSIS — C189 Malignant neoplasm of colon, unspecified: Secondary | ICD-10-CM | POA: Diagnosis not present

## 2023-08-08 LAB — BASIC METABOLIC PANEL WITH GFR
Anion gap: 10 (ref 5–15)
BUN: 9 mg/dL (ref 6–20)
CO2: 29 mmol/L (ref 22–32)
Calcium: 8.7 mg/dL — ABNORMAL LOW (ref 8.9–10.3)
Chloride: 96 mmol/L — ABNORMAL LOW (ref 98–111)
Creatinine, Ser: 0.41 mg/dL — ABNORMAL LOW (ref 0.44–1.00)
GFR, Estimated: >60 mL/min (ref >60)
Glucose, Bld: 101 mg/dL — ABNORMAL HIGH (ref 70–99)
Potassium: 3.5 mmol/L (ref 3.5–5.1)
Sodium: 135 mmol/L (ref 135–145)

## 2023-08-08 LAB — CBC
HCT: 32.5 % — ABNORMAL LOW (ref 36.0–46.0)
Hemoglobin: 10 g/dL — ABNORMAL LOW (ref 12.0–15.0)
MCH: 27.3 pg (ref 26.0–34.0)
MCHC: 30.8 g/dL (ref 30.0–36.0)
MCV: 88.8 fL (ref 80.0–100.0)
Platelets: 385 K/uL (ref 150–400)
RBC: 3.66 MIL/uL — ABNORMAL LOW (ref 3.87–5.11)
RDW: 16.3 % — ABNORMAL HIGH (ref 11.5–15.5)
WBC: 6.3 K/uL (ref 4.0–10.5)
nRBC: 0 % (ref 0.0–0.2)

## 2023-08-08 MED ORDER — HYDROMORPHONE HCL 1 MG/ML IJ SOLN
1.0000 mg | Freq: Once | INTRAMUSCULAR | Status: AC
  Administered 2023-08-08: 1 mg via INTRAVENOUS

## 2023-08-08 MED ORDER — DEXAMETHASONE SODIUM PHOSPHATE 4 MG/ML IJ SOLN
4.0000 mg | Freq: Two times a day (BID) | INTRAMUSCULAR | Status: DC
  Administered 2023-08-08 – 2023-08-11 (×6): 4 mg via INTRAVENOUS
  Filled 2023-08-08 (×6): qty 1

## 2023-08-08 MED ORDER — FENTANYL CITRATE PF 50 MCG/ML IJ SOSY
25.0000 ug | PREFILLED_SYRINGE | Freq: Once | INTRAMUSCULAR | Status: AC
  Administered 2023-08-08: 25 ug via INTRAVENOUS
  Filled 2023-08-08: qty 1

## 2023-08-08 MED ORDER — LACTULOSE 10 GM/15ML PO SOLN: 20.0000 g | Freq: Every day | ORAL | Status: DC | PRN

## 2023-08-08 NOTE — Progress Notes (Signed)
 Progress Note   Patient: April [name] FMW:[id number] DOB: June 27, 1975 DOA: 08/02/2023     5 DOS: the patient was seen and examined on 08/08/2023     Brief Narrative: From HPI April [name] is a 48 y.o. 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  In the ED, blood pressure was elevated.  Labs were notable for AST of 48 alkaline phosphatase 661.  Lipase 35.  Lactate of 1.2.  Urinalysis showed negative nitrate but more than 50 RBCs.  CT scan done showed mild rectal soft tissue mass with right hepatic metastasis peritoneal disease and omental caking with a small volume abdominal pelvic ascites.  Patient was then admitted to our hospital for further evaluation and treatment.  During hospitalization patient has been seen by palliative care and oncology.  Has been started on inpatient chemotherapy since 08/05/2023.      Assessment & Plan: Stage IV sigmoid colon cancer with metastasis and  CBD obstruction Status post robotic low anterior resection in 2021.  History of chemotherapy in the past.  Status post bilateral hepatic stents on 02/26/2023 and GI had recommended follow-up in 6 to 12 months for stent exchange.  At this time oncology has seen the patient and oncology been started on chemotherapy since 08/05/2023 patient had a last dose on 08/07/2023 - Complete 5-FU palliative chemotherapy 7/25.  - Continue oncology follow up after discharge, appreciate Dr. Andriette involvement.   Palliative team on board and working on weaning off IV pain medication   Intractable abdominal pain: Multifactorial:  - Treat constipation aggressively with senokot, miralax , lactulose . Can apply lidocaine  jelly topically to rectum prior  to BM. Anusol  supp.  - Pain uncontrolled on significant doses of oxycontin , po and IV dilaudid .  Palliative team working on transitioning from Dilaudid  PCA pump hopefully in the next few days   Cancer associated pain Continue dilaudid  PCA Appreciate palliative team input   Hypertensive urgency: Likely secondary to underlying pain.   - Continue amlodipine , clonidine , metoprolol  and new hydralazine  25mg  q8h.  Monitor vitals closely   Hypokalemia:  - Resolved, stable.    Bilateral renal calculi: Nonobstructive in nature.  Continue to monitor.   Normocytic anemia: Hgb stable, no bleeding. Likely anemia of chronic disease in the setting of metastatic cancer.  - Typical transfusion threshold 7g/dl.     Subjective:  Patient seen and examined at bedside this morning On PCA pump She tells me her pain is much controlled Denies abdominal pain chest pain cough Did have some diarrhea with her lactulose  for which lactulose  dose being adjusted by nursing staff  Physical Exam: Gen: Middle-age female laying in bed in no acute distress Pulm: Clear, nonlabored  CV: RRR GI: Limited exam due to pain Neuro: Alert and oriented. No new focal deficits. Ext: Warm, no deformities Skin: No open wounds on visualized skin   Status is: Inpatient   Planned Discharge Destination: Hopefully home  Time spent: 42 minutes  Data Reviewed:    Latest Ref Rng & Units 08/08/2023    3:28 AM 08/07/2023    3:54 AM 08/06/2023    4:11 AM  CBC  WBC 4.0 - 10.5 K/uL 6.3  7.5  9.2   Hemoglobin 12.0 - 15.0 g/dL 89.9  89.8  89.3   Hematocrit 36.0 - 46.0 %  32.5  32.2  33.8   Platelets 150 - 400 K/uL 385  400  442        Latest Ref Rng & Units 08/08/2023    3:28 AM 08/07/2023    3:54 AM 08/06/2023    4:11 AM  BMP  Glucose 70 - 99 mg/dL 898  894  863   BUN 6 - 20 mg/dL 9  9  10    Creatinine 0.44 - 1.00 mg/dL 9.58  9.53  9.51   Sodium 135 - 145 mmol/L 135  131  130   Potassium 3.5 - 5.1 mmol/L 3.5  3.6  3.7    Chloride 98 - 111 mmol/L 96  96  93   CO2 22 - 32 mmol/L 29  27  27    Calcium  8.9 - 10.3 mg/dL 8.7  8.5  9.2     Vitals:   08/08/23 1003 08/08/23 1004 08/08/23 1207 08/08/23 1211  BP: 107/83     Pulse:      Resp:  12 10 10   Temp:      TempSrc:      SpO2:  100% 98% 100%  Weight:      Height:          Author: Drue ONEIDA Potter, MD 08/08/2023 12:56 PM  For on call review [email or web address].

## 2023-08-08 NOTE — Progress Notes (Signed)
 Dressing change overdue, pt requesting dressing to be changed at 9a today.

## 2023-08-08 NOTE — Plan of Care (Signed)

## 2023-08-08 NOTE — Progress Notes (Signed)
 Daily Progress Note   Patient Name: April [name]       Date: 08/08/2023 DOB: 1975/12/27  Age: 48 y.o. MRN#: [id number] Attending Physician: Dorinda Drue DASEN, MD Primary Care Physician: Oris Camie BRAVO, NP Admit Date: 08/02/2023  Reason for Consultation/Follow-up: Pain control  Subjective:  April [name] is a 48 y.o. 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 She was admitted 08/02/2023 for uncontrolled cancer pain.  PMT has been following for symptom management.  Oncology is also following.  She is currently receiving inpatient chemotherapy.  Initiation date 08/05/2023.  Today, patient states that her pain is well controlled, however, she wants the Lactulose  to be made PRN instead of scheduled.  PCA needs noted.  Patient rested well overnight as far as pain is concerned, however she did have several BMs that kept her up.    Chart review/care coordination:  Completed extensive chart review including EPIC notes, medication administration record, labs (mild and progressive hyponatremia, stable renal function, mild hypocalcemia, mild anemia-stable), CT abdomen pelvis independently reviewed hepatic mets present, soft tissue mass present in rectum.  Mild ascites. Coordinated care with admitting provider, nursing staff, pharmacy.   Length of Stay: 5   Awake alert No distress Regular work of breathing Abdomen not distended Trace edema oriented          Vital Signs: BP 107/83   Pulse 75   Temp 98.1 F (36.7 C) (Oral)   Resp 12   Ht 5' 4 (1.626 m)   Wt 56.7 kg   LMP 01/07/2006   SpO2 100%   BMI 21.46 kg/m  SpO2: SpO2: 100 % O2 Device: O2 Device: Nasal Cannula O2 Flow Rate: O2 Flow Rate  (L/min): 2 L/min      Palliative Assessment/Data: 50-60% (pain limits mobility, appetite diminished due to pain)   Palliative Care Assessment & Plan   Patient Profile/Assessment:  48 year old female with metastatic stage IV colorectal cancer admitted to the hospital for pain management.  Receiving inpatient chemotherapy.  Some progression of disease noted on imaging.  Being followed by oncology.  She continues to have severe pain and requires frequent doses of short acting pain medication.  Suboptimal response on current pain regimen.  She has tolerated PCA in the past with improved pain control.  She is agreeable to Dilaudid  PCA for further pain management.  Extensive review of MAR.  Calculated total opioid dose for the past 24 hours and performed opioid conversion and will plan to initiate Dilaudid  PCA. Collaborated with pharmacy for dosing.    Recommendations/Plan/Symptom management:  Dilaudid  PCA at 1.2 mg continuous with 0.6 mg bolus every 10 minutes as needed: PCA needs noted on MAR.  Continue scheduled senna and MiraLAX  Continue lactulose  as needed Continue Phenergan  as needed Would benefit from further goals of care discussion but deferred at this time until symptoms are better managed Palliative team to continue to follow and adjust regimen as indicated   Prognosis:  Guarded    Discharge Planning: To Be Determined    Detailed review of medical records (labs, imaging, vital signs), medically appropriate exam, discussed with treatment  team, counseling and education to patient, family, & staff, documenting clinical information, medication management, coordination of care    Billing based on MDM: High  Problems Addressed: One or more chronic illnesses with severe exacerbation, progression, or side effects of treatment.  Amount and/or Complexity of Data: Category 1:Review of the result(s) of each unique test and Category 3:Discussion of management or test interpretation  with external physician/other qualified health care professional/appropriate source (not separately reported)  Risks: Parenteral controlled substances         Ishitha Roper, MD  Palliative Medicine Team Team phone # [phone]  Thank you for allowing the Palliative Medicine Team to assist in the care of this patient. Please utilize secure chat with additional questions, if there is no response within 30 minutes please call the above phone number.  Palliative Medicine Team providers are available by phone from 7am to 7pm daily and can be reached through the team cell phone.  Should this patient require assistance outside of these hours, please call the patient's attending physician.

## 2023-08-09 DIAGNOSIS — C787 Secondary malignant neoplasm of liver and intrahepatic bile duct: Secondary | ICD-10-CM | POA: Diagnosis not present

## 2023-08-09 DIAGNOSIS — C189 Malignant neoplasm of colon, unspecified: Secondary | ICD-10-CM | POA: Diagnosis not present

## 2023-08-09 LAB — BASIC METABOLIC PANEL WITH GFR
Anion gap: 8 (ref 5–15)
BUN: 8 mg/dL (ref 6–20)
CO2: 31 mmol/L (ref 22–32)
Calcium: 9 mg/dL (ref 8.9–10.3)
Chloride: 95 mmol/L — ABNORMAL LOW (ref 98–111)
Creatinine, Ser: 0.37 mg/dL — ABNORMAL LOW (ref 0.44–1.00)
GFR, Estimated: >60 mL/min (ref >60)
Glucose, Bld: 120 mg/dL — ABNORMAL HIGH (ref 70–99)
Potassium: 4.5 mmol/L (ref 3.5–5.1)
Sodium: 134 mmol/L — ABNORMAL LOW (ref 135–145)

## 2023-08-09 LAB — CBC WITH DIFFERENTIAL/PLATELET
Abs Immature Granulocytes: 0.04 K/uL (ref 0.00–0.07)
Basophils Absolute: 0 K/uL (ref 0.0–0.1)
Basophils Relative: 0 %
Eosinophils Absolute: 0 K/uL (ref 0.0–0.5)
Eosinophils Relative: 0 %
HCT: 30.7 % — ABNORMAL LOW (ref 36.0–46.0)
Hemoglobin: 9.7 g/dL — ABNORMAL LOW (ref 12.0–15.0)
Immature Granulocytes: 0 %
Lymphocytes Relative: 17 %
Lymphs Abs: 1.6 K/uL (ref 0.7–4.0)
MCH: 27.9 pg (ref 26.0–34.0)
MCHC: 31.6 g/dL (ref 30.0–36.0)
MCV: 88.2 fL (ref 80.0–100.0)
Monocytes Absolute: 0.4 K/uL (ref 0.1–1.0)
Monocytes Relative: 5 %
Neutro Abs: 7.2 K/uL (ref 1.7–7.7)
Neutrophils Relative %: 78 %
Platelets: 365 K/uL (ref 150–400)
RBC: 3.48 MIL/uL — ABNORMAL LOW (ref 3.87–5.11)
RDW: 16.1 % — ABNORMAL HIGH (ref 11.5–15.5)
WBC: 9.3 K/uL (ref 4.0–10.5)
nRBC: 0 % (ref 0.0–0.2)

## 2023-08-09 MED ORDER — HYDROMORPHONE 1 MG/ML IV SOLN
INTRAVENOUS | Status: DC
  Administered 2023-08-09: 30 mg via INTRAVENOUS
  Administered 2023-08-09: 10.11 mg via INTRAVENOUS
  Administered 2023-08-09: 10.22 mg via INTRAVENOUS
  Administered 2023-08-10: 30 mg via INTRAVENOUS
  Administered 2023-08-10: 7.33 mg via INTRAVENOUS
  Filled 2023-08-09 (×2): qty 30

## 2023-08-09 MED ORDER — HYDRALAZINE HCL 50 MG PO TABS
50.0000 mg | ORAL_TABLET | Freq: Three times a day (TID) | ORAL | Status: DC
  Administered 2023-08-09 – 2023-08-12 (×8): 50 mg via ORAL
  Filled 2023-08-09 (×10): qty 1

## 2023-08-09 NOTE — Progress Notes (Addendum)
 PROGRESS NOTE  Shawonda Kerce FMW:[id number] DOB: March 06, 1975 DOA: 08/02/2023 PCP: Oris Camie BRAVO, NP   LOS: 6 days   Brief narrative:  April [name] is a 48 y.o. 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  In the ED, blood pressure was elevated.  Labs were notable for AST of 48 alkaline phosphatase 661.  Lipase 35.  Lactate of 1.2.  Urinalysis showed negative nitrate but more than 50 RBCs.  CT scan done showed mild rectal soft tissue mass with right hepatic metastasis peritoneal disease and omental caking with a small volume abdominal pelvic ascites.  Patient was then admitted to our hospital for further evaluation and treatment.  During hospitalization patient has been seen by palliative care and oncology.  Has been started on inpatient chemotherapy since 08/05/2023.  Currently being started on Dilaudid  pump for adequate pain management.  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  History of chemotherapy in the past.  Status post bilateral hepatic stents on 02/26/2023 and GI had recommended follow-up in 6 to 12 months for stent exchange.  Patient was initiated on chemotherapy on 08/05/2023.  Last chemoon 08/07/2023.  Will need to follow-up with oncology after discharge.  Intractable abdominal pain.  Cancer associated pain.   Palliative care on board and on Dilaudid  PCA.  States that her current pain regimen with Dilaudid  pump is working but usually does not work at home.  Will need to discharge with palliative care prior to discharge.  On oral Dilaudid  and long-acting OxyContin  at home.  Hypertensive urgency Likely secondary to underlying pain.  Continue amlodipine , clonidine , metoprolol  and as needed hydralazine .  Diastolic blood pressure still elevated was recently started on hydralazine  25 mg 3 times daily.  Will increase hydralazine  to 50 mg 3 times daily.  Hypokalemia Improved.  Latest potassium 4.5.   Bilateral renal calculi - Nonobstructive in nature.  Continue to monitor.   Normocytic anemia   Likely anemia of chronic disease in the setting of metastatic cancer.  Latest hemoglobin of 9.7.   Leukocytosis and thrombocytosis Resolved.  DVT prophylaxis: enoxaparin  (LOVENOX ) injection 40 mg Start: 08/03/23 1000   Disposition: Home when okay with oncology and palliative care likely in 1 to 2 days.  Status is: Inpatient Remains inpatient appropriate because: Pending clinical improvement, IV narcotics and PCA pump  Code Status:     Code Status: Full Code  Family Communication: None at bedside.  Consultants: Palliative care Oncology  Procedures: Initiation of chemotherapy  Anti-infectives:  None  Anti-infectives (From admission, onward)    None       Subjective: Today, patient was seen and examined at bedside.  States that her pain is controlled on Dilaudid  pump.  Still has mild lower quadrant pain.  Complains of prolapsed hemorrhoids and does not warrant to take lactulose  but okay with stool softeners..  Objective: Vitals:   08/09/23 0438 08/09/23 0458  BP:  (!) 157/114  Pulse:  74  Resp: 14 18  Temp:  98.5 F (36.9 C)  SpO2: 100% 100%    Intake/Output Summary (Last 24 hours) at 08/09/2023 0706 Last data filed at 08/09/2023 0600 Gross per 24 hour  Intake 807.52 ml  Output  --  Net 807.52 ml   Filed Weights   08/02/23 2014 08/03/23 0009  Weight: 54.9 kg 56.7 kg   Body mass index is 21.46 kg/m.   Physical Exam:  General:  Average built, not in obvious distress, Communicative, oriented, HENT:   No scleral pallor or icterus noted. Oral mucosa is moist.  Chest:  Clear breath sounds.  Diminished breath sounds bilaterally. No crackles or wheezes.  CVS: S1 &S2 heard. No murmur.  Regular rate and rhythm. Abdomen: Soft, mild right lower quadrant tenderness on palpation,, mildly distended bowel sounds are heard.   Extremities: No cyanosis, clubbing or edema.  Peripheral pulses are palpable.  Left upper extremity PICC line in place Psych: Alert, awake and oriented, normal mood CNS:  No cranial nerve deficits.  Power equal in all extremities.   Skin: Warm and dry.  No rashes noted.  Data Review: I have personally reviewed the following laboratory data and studies,  CBC: Recent Labs  Lab 08/02/23 2032 08/02/23 2041 08/04/23 1506 08/05/23 0204 08/06/23 0411 08/07/23 0354 08/08/23 0328 08/09/23 0331  WBC 12.6*   < > 10.2 9.3 9.2 7.5 6.3 9.3  NEUTROABS 8.1*  --  6.4  --   --  5.4  --  7.2  HGB 10.4*   < > 9.9* 9.8* 10.6* 10.1* 10.0* 9.7*  HCT 33.3*   < > 31.5* 32.2* 33.8* 32.2* 32.5* 30.7*  MCV 87.9   < > 89.0 89.0 87.1 87.7 88.8 88.2  PLT 415*   < > 411* 404* 442* 400 385 365   < > = values in this interval not displayed.   Basic Metabolic Panel: Recent Labs  Lab 08/02/23 2032 08/02/23 2041 08/03/23 0044 08/04/23 1506 08/05/23 0204 08/06/23 0411 08/07/23 0354 08/08/23 0328 08/09/23 0331  NA 137   < > 139   < > 134* 130* 131* 135 134*  K 3.8   < > 3.1*   < > 3.6 3.7 3.6 3.5 4.5  CL 95*   < > 96*   < > 95* 93* 96* 96* 95*  CO2 31  --  31   < > 27 27 27 29 31   GLUCOSE 103*   < > 103*   < > 158* 136* 105* 101* 120*  BUN 8   < > 7   < > 6 10 9 9 8   CREATININE 0.72   < > 0.64   < > 0.51 0.48 0.46 0.41* 0.37*  CALCIUM  9.0  --  9.0   < > 8.7* 9.2  8.5* 8.7* 9.0  MG 2.0  --  1.9  --   --  1.9 2.1  --   --   PHOS  --   --   --   --   --   --  3.6  --   --    < > = values in this interval not displayed.   Liver Function Tests: Recent Labs  Lab 08/02/23 2032  AST 48*  ALT 16  ALKPHOS 661*  BILITOT 0.6  PROT 7.7  ALBUMIN 3.0*   Recent Labs  Lab 08/02/23 2032  LIPASE 35   No results for input(s): AMMONIA in the last 168 hours. Cardiac Enzymes: No  results for input(s): CKTOTAL, CKMB, CKMBINDEX, TROPONINI in the last 168 hours. BNP (last 3 results) No results for input(s): BNP in the last 8760 hours.  ProBNP (last 3 results) No results for input(s): PROBNP in the last 8760 hours.  CBG: No results for input(s): GLUCAP in the last 168 hours. Recent Results (from the past 240 hours)  Blood culture (routine x 2)     Status: None   Collection Time: 08/02/23  9:10 PM   Specimen: BLOOD  Result Value Ref Range Status   Specimen Description   Final    BLOOD BLOOD RIGHT HAND Performed at [facility], [address] [place], [place] [address]    Special Requests   Final    BOTTLES DRAWN AEROBIC AND ANAEROBIC Blood Culture adequate volume Performed at [facility], [address], [place], [place] [address]    Culture   Final    NO GROWTH 5 DAYS Performed at [facility], [address], [place], [place] [address]    Report Status 08/07/2023 FINAL  Final  Blood culture (routine x 2)     Status: None (Preliminary result)   Collection Time: 08/02/23  9:35 PM   Specimen: BLOOD  Result Value Ref Range Status   Specimen Description   Final    BLOOD BLOOD LEFT HAND Performed at [facility], [address], [place], [place] [address]    Special Requests   Final    BOTTLES DRAWN AEROBIC AND ANAEROBIC Blood Culture adequate volume Performed at [facility], [address], [place], [place] [address]    Culture   Final    NO  GROWTH 4 DAYS Performed at [facility], [address] [place], [place] [address]    Report Status PENDING  Incomplete  Urine Culture     Status: Abnormal   Collection Time: 08/02/23 10:41 PM   Specimen: Urine, Clean Catch  Result Value Ref Range Status   Specimen Description   Final    URINE, CLEAN CATCH Performed at [facility], [address], [place], [place] [address]    Special Requests   Final    Immunocompromised Performed at [facility], [address], [place], [place] [address]    Culture (A)  Final    <10,000 COLONIES/mL INSIGNIFICANT GROWTH Performed at [facility], [address] [place], [place] [address]    Report Status 08/04/2023 FINAL  Final     Studies: No results found.    Vernal Alstrom, MD  [facility] 08/09/2023  If 7PM-7AM, please contact night-coverage

## 2023-08-09 NOTE — Progress Notes (Signed)
 Daily Progress Note   Patient Name: April [name]       Date: 08/09/2023 DOB: 08/19/1975  Age: 48 y.o. MRN#: [id number] Attending Physician: Sonjia Held, MD Primary Care Physician: Oris Camie BRAVO, NP Admit Date: 08/02/2023  Reason for Consultation/Follow-up: Pain control  Subjective:  April [name] is a 48 y.o. 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 She was admitted 08/02/2023 for uncontrolled cancer pain.  PMT has been following for symptom management.  Oncology is also following.  She is currently receiving inpatient chemotherapy.  Initiation date 08/05/2023.  Today, patient states that her pain is well controlled, she is awake/alert, in good spirits. Has episodes of spasms of pain, we talked about use of dexamethasone  IV BID as well as up titrating basal dose Dilaudid  PCA.    Chart review/care coordination:  Completed extensive chart review including EPIC notes, medication administration record, labs (mild and progressive hyponatremia, stable renal function, mild hypocalcemia, mild anemia-stable), CT abdomen pelvis independently reviewed hepatic mets present, soft tissue mass present in rectum.  Mild ascites. Coordinated care with admitting provider, nursing staff, pharmacy.   Length of Stay: 6   Awake alert No distress Regular work of breathing Abdomen not distended Trace edema oriented          Vital Signs: BP (!) 137/109   Pulse 74   Temp 98.5 F (36.9 C) (Oral)   Resp 12   Ht 5' 4 (1.626 m)   Wt 56.7 kg   LMP 01/07/2006   SpO2 99%   BMI 21.46 kg/m  SpO2: SpO2: 99 % O2 Device: O2 Device: Nasal Cannula O2 Flow Rate: O2 Flow Rate (L/min): 2 L/min      Palliative  Assessment/Data: 50-60% (pain limits mobility, appetite diminished due to pain)   Palliative Care Assessment & Plan   Patient Profile/Assessment:  48 year old female with metastatic stage IV colorectal cancer admitted to the hospital for pain management.  Receiving inpatient chemotherapy.  Some progression of disease noted on imaging.  Being followed by oncology.  She continues to have severe pain and requires frequent doses of short acting pain medication.  Suboptimal response on current pain regimen.  She has tolerated PCA in the past with improved pain control.  She is agreeable to Dilaudid  PCA for further pain management.  Extensive review of MAR.  Calculated total opioid dose for the past 24 hours and performed opioid conversion and will plan to initiate Dilaudid  PCA. Collaborated with pharmacy for dosing.    Recommendations/Plan/Symptom management:  Dilaudid  PCA at 1.3 mg continuous with 0.6 mg bolus every 10 minutes as needed: PCA needs noted on MAR. Added dexamethasone  IV BID for bowel inflammation and rectal mass effect management, also for nausea.  Continue scheduled senna and MiraLAX  Continue lactulose  as needed Continue Phenergan  as needed Would benefit from further goals of care discussion but deferred at this time until symptoms are better managed Palliative team to continue to follow and adjust regimen as indicated  7-27: PCA for another 24 hours Consider Dilaudid  PO concentrated solution + fentanyl  patch and wean off PCA on 08-10-23.  Consider nerve  block such as superior hypogastric versus pudendal block for more effective pain relief once PCA is weaned off and before discharge, discussed with patient about this today, she will think about it.   Prognosis:  Guarded    Discharge Planning: To Be Determined    Detailed review of medical records (labs, imaging, vital signs), medically appropriate exam, discussed with treatment team, counseling and education to patient,  family, & staff, documenting clinical information, medication management, coordination of care    Billing based on MDM: mod  Problems Addressed: One or more chronic illnesses with severe exacerbation, progression, or side effects of treatment.  Amount and/or Complexity of Data: Category 1:Review of the result(s) of each unique test and Category 3:Discussion of management or test interpretation with external physician/other qualified health care professional/appropriate source (not separately reported)  Risks: Parenteral controlled substances         Stanely Sexson, MD  Palliative Medicine Team Team phone # [phone]  Thank you for allowing the Palliative Medicine Team to assist in the care of this patient. Please utilize secure chat with additional questions, if there is no response within 30 minutes please call the above phone number.  Palliative Medicine Team providers are available by phone from 7am to 7pm daily and can be reached through the team cell phone.  Should this patient require assistance outside of these hours, please call the patient's attending physician.

## 2023-08-10 DIAGNOSIS — C189 Malignant neoplasm of colon, unspecified: Secondary | ICD-10-CM | POA: Diagnosis not present

## 2023-08-10 DIAGNOSIS — C787 Secondary malignant neoplasm of liver and intrahepatic bile duct: Secondary | ICD-10-CM | POA: Diagnosis not present

## 2023-08-10 LAB — CBC WITH DIFFERENTIAL/PLATELET
Abs Immature Granulocytes: 0.06 K/uL (ref 0.00–0.07)
Basophils Absolute: 0 K/uL (ref 0.0–0.1)
Basophils Relative: 0 %
Eosinophils Absolute: 0 K/uL (ref 0.0–0.5)
Eosinophils Relative: 0 %
HCT: 33.1 % — ABNORMAL LOW (ref 36.0–46.0)
Hemoglobin: 10.3 g/dL — ABNORMAL LOW (ref 12.0–15.0)
Immature Granulocytes: 0 %
Lymphocytes Relative: 12 %
Lymphs Abs: 1.6 K/uL (ref 0.7–4.0)
MCH: 27.8 pg (ref 26.0–34.0)
MCHC: 31.1 g/dL (ref 30.0–36.0)
MCV: 89.2 fL (ref 80.0–100.0)
Monocytes Absolute: 0.3 K/uL (ref 0.1–1.0)
Monocytes Relative: 2 %
Neutro Abs: 11.6 K/uL — ABNORMAL HIGH (ref 1.7–7.7)
Neutrophils Relative %: 86 %
Platelets: 431 K/uL — ABNORMAL HIGH (ref 150–400)
RBC: 3.71 MIL/uL — ABNORMAL LOW (ref 3.87–5.11)
RDW: 16.1 % — ABNORMAL HIGH (ref 11.5–15.5)
WBC: 13.5 K/uL — ABNORMAL HIGH (ref 4.0–10.5)
nRBC: 0 % (ref 0.0–0.2)

## 2023-08-10 LAB — CULTURE, BLOOD (ROUTINE X 2)
Culture: NO GROWTH
Special Requests: ADEQUATE

## 2023-08-10 LAB — BASIC METABOLIC PANEL WITH GFR
Anion gap: 11 (ref 5–15)
BUN: 9 mg/dL (ref 6–20)
CO2: 27 mmol/L (ref 22–32)
Calcium: 9.3 mg/dL (ref 8.9–10.3)
Chloride: 95 mmol/L — ABNORMAL LOW (ref 98–111)
Creatinine, Ser: 0.51 mg/dL (ref 0.44–1.00)
GFR, Estimated: >60 mL/min (ref >60)
Glucose, Bld: 142 mg/dL — ABNORMAL HIGH (ref 70–99)
Potassium: 4.4 mmol/L (ref 3.5–5.1)
Sodium: 133 mmol/L — ABNORMAL LOW (ref 135–145)

## 2023-08-10 MED ORDER — ACETAMINOPHEN 500 MG PO TABS
500.0000 mg | ORAL_TABLET | Freq: Four times a day (QID) | ORAL | Status: AC
  Administered 2023-08-10 – 2023-08-11 (×2): 500 mg via ORAL
  Filled 2023-08-10 (×4): qty 1

## 2023-08-10 MED ORDER — HYDROCHLOROTHIAZIDE 12.5 MG PO TABS
12.5000 mg | ORAL_TABLET | Freq: Every day | ORAL | Status: DC
  Administered 2023-08-10 – 2023-08-12 (×3): 12.5 mg via ORAL
  Filled 2023-08-10 (×3): qty 1

## 2023-08-10 MED ORDER — FENTANYL 50 MCG/HR TD PT72
1.0000 | MEDICATED_PATCH | TRANSDERMAL | Status: DC
  Administered 2023-08-10: 1 via TRANSDERMAL
  Filled 2023-08-10: qty 1

## 2023-08-10 MED ORDER — HYDROMORPHONE HCL 1 MG/ML IJ SOLN
0.5000 mg | Freq: Once | INTRAMUSCULAR | Status: AC
  Administered 2023-08-10: 0.5 mg via INTRAVENOUS
  Filled 2023-08-10: qty 0.5

## 2023-08-10 MED ORDER — HYDROMORPHONE 1 MG/ML IV SOLN
INTRAVENOUS | Status: DC
  Administered 2023-08-10: 4.8 mg via INTRAVENOUS
  Administered 2023-08-10: 5.4 mg via INTRAVENOUS
  Administered 2023-08-11: 9.6 mg via INTRAVENOUS
  Filled 2023-08-10: qty 30

## 2023-08-10 NOTE — Progress Notes (Signed)
 Daily Progress Note   Patient Name: April [name]       Date: 08/10/2023 DOB: 1975/04/01  Age: 48 y.o. MRN#: [id number] Attending Physician: Sonjia Held, MD Primary Care Physician: Oris Camie BRAVO, NP Admit Date: 08/02/2023  Reason for Consultation/Follow-up: Pain control  Subjective:  April [name] is a 48 y.o. 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 She was admitted 08/02/2023 for uncontrolled cancer pain.  PMT has been following for symptom management.  Oncology is also following.  She is currently receiving inpatient chemotherapy.  Initiation date 08/05/2023.  Today, patient states that her pain is well controlled, she is awake/alert, in good spirits.  She states that she spoke with Dr. Deanne earlier this morning and has received encouraging news about the possibility of clinical trial for which she could qualify.  She is willing to wean off of the PCA and feels like her pain has been most reasonably controlled.  We discussed about weaning PCA and getting on a regimen that consists of transdermal fentanyl  patch plus oral hydromorphone  solution.  Chart review/care coordination:  Completed extensive chart review including EPIC notes, medication administration record, labs (mild and progressive hyponatremia, stable renal function, mild hypocalcemia, mild anemia-stable), CT abdomen pelvis independently reviewed hepatic mets present, soft tissue mass present in rectum.  Mild ascites. Coordinated care with admitting provider, nursing staff, pharmacy.   Length of Stay: 7   Awake alert No distress Regular work of breathing Abdomen not distended Trace edema oriented          Vital Signs:  BP (!) 146/110   Pulse 78   Temp 97.7 F (36.5 C) (Oral)   Resp 10   Ht 5' 4 (1.626 m)   Wt 56.7 kg   LMP 01/07/2006   SpO2 100%   BMI 21.46 kg/m  SpO2: SpO2: 100 % O2 Device: O2 Device: Nasal Cannula O2 Flow Rate: O2 Flow Rate (L/min): 2 L/min      Palliative Assessment/Data: 50-60% (pain limits mobility, appetite diminished due to pain)   Palliative Care Assessment & Plan   Patient Profile/Assessment:  48 year old female with metastatic stage IV colorectal cancer admitted to the hospital for pain management.  Receiving inpatient chemotherapy.  Some progression of disease noted on imaging.  Being followed by oncology.  She continues to have severe pain and requires frequent doses of short acting pain medication.  Suboptimal response on current pain regimen.  She has tolerated PCA in the past with improved pain control.  She is agreeable to Dilaudid  PCA for further pain management.  Extensive review of MAR.  Calculated total opioid dose for the past 24 hours and performed opioid conversion and will plan to initiate Dilaudid  PCA. Collaborated with pharmacy for dosing.    Recommendations/Plan/Symptom management:  Dilaudid  PCA at 1.3 mg continuous with 0.6 mg bolus every 10 minutes as needed: PCA needs noted on MAR. Added dexamethasone  IV BID for bowel inflammation and rectal mass effect management, also for nausea.  Continue scheduled senna and MiraLAX  Continue lactulose  as needed Continue Phenergan  as needed Would benefit from further goals of care discussion but  deferred at this time until symptoms are better managed Palliative team to continue to follow and adjust regimen as indicated  7-27: PCA for another 24 hours Consider Dilaudid  PO concentrated solution + fentanyl  patch and wean off PCA on 08-10-23.  Consider nerve block such as superior hypogastric versus pudendal block for more effective pain relief once PCA is weaned off and before discharge, discussed with patient about  this today, she will think about it.   Palliative team follow-up 7-28: Start transdermal fentanyl  50 mcg patch change every 72 hours After 4 hours, discontinue basal rate on the Dilaudid  PCA while continuing bolus only Dilaudid  PCA. 4 hours after that, discontinue Dilaudid  bolus only PCA and start oral hydromorphone  solution for pain, will still have provision for some IV Dilaudid  to be used only as a last resort, for rescue/breakthrough pain once the patient comes off of the PCA.   Prognosis:  Guarded    Discharge Planning: To Be Determined    Detailed review of medical records (labs, imaging, vital signs), medically appropriate exam, discussed with treatment team, counseling and education to patient, family, & staff, documenting clinical information, medication management, coordination of care    Billing based on MDM: mod  Problems Addressed: One or more chronic illnesses with severe exacerbation, progression, or side effects of treatment.  Amount and/or Complexity of Data: Category 1:Review of the result(s) of each unique test and Category 3:Discussion of management or test interpretation with external physician/other qualified health care professional/appropriate source (not separately reported)  Risks: Parenteral controlled substances         Stasha Naraine, MD  Palliative Medicine Team Team phone # [phone]  Thank you for allowing the Palliative Medicine Team to assist in the care of this patient. Please utilize secure chat with additional questions, if there is no response within 30 minutes please call the above phone number.  Palliative Medicine Team providers are available by phone from 7am to 7pm daily and can be reached through the team cell phone.  Should this patient require assistance outside of these hours, please call the patient's attending physician.

## 2023-08-10 NOTE — Progress Notes (Addendum)
 April [name]   DOB:[birth year]   FM#:[id number]      ASSESSMENT & PLAN:  April [name] is a 48 year old female patient with oncologic history significant for sigmoid colon cancer, stage IV. She was admitted 08/02/2023 due to worsening abdominal pain. Follows with Dr. Cloretta.   Abdominal pain Cancer associated pain Constipation - Admitted 08/02/2023 due to worsening abdominal pain -- Initiated on PCA per Palliative team management. Pain meds management per Palliative, appreciate input. - Secondary to malignancy - Continues to have good bowel movements, complains of hemorrhoids.  -- Continue bowel regimen, wean as tolerated per the palliative care service   - Encourage fluids and hydration - Palliative following closely   Sigmoid colon cancer stage IV (pT4a,pN2b,M1c)  - Diagnosed 2021 -Status post robotic assisted low anterior resection, mesenteric lymphadenectomy, bilateral salpingo-- oophorectomy in 2021. - Status post FOLFIRI plus bevacizumab  initiated 05/24/2019.  Bevacizumab  held from cycle 5.  Completed cycle 11 12/26/2019. - Restarted on FOLFIRI plus panitumumab  09/19/2020, completed cycle 15 06/17/2021.  Started on cycle 1 Lonsurf  and Avastin  10/14/2021.  Cycle 6 completed 03/03/2022.  Subsequently given cycle 1 FOLFOX for 02/01/2022, cycle 11 given 09/22/2022.  Patient then declined further treatment. - Restarted patient on irinotecan  plus panitumumab  01/06/2023 with cycle 8 given 06/19/2023.  Irinotecan  discontinued 07/10/2023 due to lack of clinical benefit and poor tolerance. - When patient was admitted, she was off treatment.  The plan was to begin treatment with recycled FOLFOX +/- panitumumab  depending on results of NGS testing.  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   -- Status post FOLFOX chemo regimen on 7/23, tolerated well with some mild nausea relieved with anti-emetics.   -- Will  consider palliative radiation to the rectum if her pain persists.   -- Patient understands no therapy will be curative. -- Medical Oncology/Dr. Cloretta following closely.    Anemia, normocytic - Hemoglobin stable 10.3 today - Likely multifactorial due to malignancy, recent oncologic therapy, poor nutrition - Recommend PRBC transfusion for hemoglobin <7.0 - Continue to monitor CBC with differential   Thrombocytosis - Likely reactive, stable 431K - Continue to monitor   Hemorrhoids  --Ongoing --Continue anusol  and topical lidocaine  as ordered    Code Status Full   Subjective:  Patient seen awake and alert laying in bed. PCA is ongoing.  Reports that patch for pain and that she will be weaned off PCA today, nervous that pain will return if pump is discontinued.  No other complaints offered.   Objective:   Intake/Output Summary (Last 24 hours) at 08/10/2023 0951 Last data filed at 08/09/2023 2200 Gross per 24 hour  Intake 676.06 ml  Output --  Net 676.06 ml     PHYSICAL EXAMINATION: ECOG PERFORMANCE STATUS: 2 - Symptomatic, <50% confined to bed  Vitals:   08/10/23 0647 08/10/23 0800  BP: (!) 146/110   Pulse: 78   Resp:  10  Temp:    SpO2:  100%   Filed Weights   08/02/23 2014 08/03/23 0009  Weight: 121 lb (54.9 kg) 125 lb (56.7 kg)    GENERAL: alert, +improved abdominal pain  SKIN: skin color, texture, turgor are normal, no rashes or significant lesions EYES: normal, conjunctiva are pink and non-injected, sclera clear OROPHARYNX: no exudate, no erythema and lips, buccal mucosa, and tongue normal  NECK: supple, thyroid normal size, non-tender, without nodularity LYMPH: no palpable lymphadenopathy in the cervical, axillary  or inguinal LUNGS: clear to auscultation and percussion with normal breathing effort HEART: regular rate & rhythm and no murmurs and no lower extremity edema ABDOMEN: abdomen soft, non-tender and normal bowel sounds MUSCULOSKELETAL: no  cyanosis of digits and no clubbing  PSYCH: alert & oriented x 3 with fluent speech NEURO: no focal motor/sensory deficits   All questions were answered. The patient knows to call the clinic with any problems, questions or concerns.   The total time spent in the appointment was 40 minutes encounter with patient including review of chart and various tests results, discussions about plan of care and coordination of care plan  April JINNY Brunner, NP 08/10/2023 9:51 AM    Labs Reviewed:  Lab Results  Component Value Date   WBC 13.5 (H) 08/10/2023   HGB 10.3 (L) 08/10/2023   HCT 33.1 (L) 08/10/2023   MCV 89.2 08/10/2023   PLT 431 (H) 08/10/2023   Recent Labs    11/21/22 0557 11/22/22 0710 12/16/22 0505 12/17/22 0500 06/21/23 0539 06/22/23 0415 07/10/23 0920 07/30/23 1027 08/02/23 2032 08/02/23 2041 08/08/23 0328 08/09/23 0331 08/10/23 0247  NA 138   < > 132*   < > 133*   < > 138 137 137   < > 135 134* 133*  K 3.5   < > 2.9*   < > 3.4*   < > 3.2* 2.9* 3.8   < > 3.5 4.5 4.4  CL 101   < > 97*   < > 95*   < > 98 93* 95*   < > 96* 95* 95*  CO2 24   < > 29   < > 29   < > 32 30 31   < > 29 31 27   GLUCOSE 139*   < > 97   < > 83   < > 80 114* 103*   < > 101* 120* 142*  BUN 9   < > 8   < > 7   < > 6 8 8    < > 9 8 9   CREATININE 0.58   < > 0.52   < > 0.37*   < > 0.40* 0.50 0.72   < > 0.41* 0.37* 0.51  CALCIUM  10.3   < > 8.5*   < > 8.5*   < > 9.3 9.9 9.0   < > 8.7* 9.0 9.3  GFRNONAA >60   < > >60   < > >60   < > >60 >60 >60   < > >60 >60 >60  PROT 8.9*   < > 7.1   < > 6.9  --  7.1 8.6* 7.7  --   --   --   --   ALBUMIN 4.4  4.5   < > 3.2*   < > 2.6*  --  3.3* 4.0 3.0*  --   --   --   --   AST 35   < > 37   < > 25  --  60* 53* 48*  --   --   --   --   ALT 24   < > 28   < > 16  --  32 16 16  --   --   --   --   ALKPHOS 172*   < > 204*   < > 606*  --  785* 971* 661*  --   --   --   --   BILITOT 0.6   < > 0.5   < >  0.7  --  0.3 0.4 0.6  --   --   --   --   BILIDIR <0.1  --  0.1  --  <0.1  --    --   --   --   --   --   --   --   IBILI NOT CALCULATED  --  0.4  --  NOT CALCULATED  --   --   --   --   --   --   --   --    < > = values in this interval not displayed.    Studies Reviewed:  Abd 1 View (KUB) Result Date: 08/03/2023 CLINICAL DATA:  48 year old female with constipation. Metastatic colon cancer. EXAM: ABDOMEN - 1 VIEW COMPARISON:  CT Abdomen and Pelvis last night. FINDINGS: Supine AP view of the lower abdomen and pelvis at 0509 hours. Excreted IV contrast now in the urinary bladder. Plastic biliary stents redemonstrated. Nephrolithiasis redemonstrated. Bowel-gas pattern unchanged from the CT Abdomen and Pelvis last night. Stable visualized osseous structures. IMPRESSION: 1. Unchanged bowel-gas pattern from CT last night. 2. Redemonstrated biliary stents, nephrolithiasis. Electronically Signed   By: VEAR Hurst M.D.   On: 08/03/2023 05:50   CT ABDOMEN PELVIS W CONTRAST Result Date: 08/02/2023 EXAM: CT ABDOMEN AND PELVIS WITH CONTRAST 08/02/2023 09:22:06 PM TECHNIQUE: CT of the abdomen and pelvis was performed with the administration of intravenous contrast. Multiplanar reformatted images are provided for review. Automated exposure control, iterative reconstruction, and/or weight based adjustment of the mA/kV was utilized to reduce the radiation dose to as low as reasonably achievable. COMPARISON: 06/21/2023 CLINICAL HISTORY: Abdominal pain, acute, nonlocalized; Bowel obstruction suspected; stage 4 colon cancer, abdominal pain, unable to defecate. FINDINGS: LOWER CHEST: No acute abnormality. LIVER: Scattered right hepatic metastases measuring up to 18 mm and fairly in the right hepatic lobe, progressive. Mild central intrahepatic ductal dilatation, mildly progressive, with two indwelling common duct stents. GALLBLADDER AND BILE DUCTS: Gallbladder is unremarkable. No biliary ductal dilatation. SPLEEN: No acute abnormality. PANCREAS: No acute abnormality. ADRENAL GLANDS: No acute abnormality.  KIDNEYS, URETERS AND BLADDER: 18 mm simple left lower pole renal cyst (image 38 ), benign (Bosniak 1 ). No follow up is recommended. Bilateral nonobstructing renal calculi measuring up to 10 mm in the left upper kidney and right lower kidney. No hydronephrosis. No perinephric or periureteral stranding. Urinary bladder is unremarkable. GI AND BOWEL: Suspected soft tissue mass along the mid rectum with adjacent peritoneal nodularity along the posterior uterus, favored to be mildly progressive. PERITONEUM AND RETROPERITONEUM: Trace perihepatic ascites. Scattered peritoneal disease including omental caking beneath the right anterior abdominal wall, grossly unchanged. Small volume pelvic ascites, with a 6.6 cm loculated fluid at the lesion in the left deep pelvis, mildly progressive. VASCULATURE: Aorta is normal in caliber. LYMPH NODES: No lymphadenopathy. REPRODUCTIVE ORGANS: Soft tissue nodularity along the posterior uterus, as above. BONES AND SOFT TISSUES: No acute osseous abnormality. No focal soft tissue abnormality. IMPRESSION: 1. Mid rectal soft tissue mass, mildly progressive. 2. Right hepatic metastases, progressive. 3. Peritoneal disease/omental caking, stable versus mildly progressive. 4. Small volume abdominopelvic ascites, mildly progressive. Electronically signed by: Pinkie Pebbles MD 08/02/2023 09:40 PM EDT RP Workstation: [id number]  Ms. Woolf was interviewed and examined.  She reports much better pain control with the PCA.  She continues to have intermittent vomiting.  She is now at day 6 following FOLFOX chemotherapy.  She appears to have tolerated the chemotherapy well.  She will be scheduled for  another cycle of FOLFOX next week.  We are checking into clinical trial availability at [facility] with a K-ras inhibitor.  No need for a daily CBC. I appreciate the care from the medicine and palliative care services.

## 2023-08-10 NOTE — Progress Notes (Signed)
 PROGRESS NOTE  April [name] FMW:[id number] DOB: May 18, 1975 DOA: 08/02/2023 PCP: Oris Camie BRAVO, NP   LOS: 7 days   Brief narrative:  April [name] is a 48 y.o. 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  In the ED, blood pressure was elevated.  Labs were notable for AST of 48 alkaline phosphatase 661.  Lipase 35.  Lactate of 1.2.  Urinalysis showed negative nitrate but more than 50 RBCs.  CT scan done showed mild rectal soft tissue mass with right hepatic metastasis peritoneal disease and omental caking with a small volume abdominal pelvic ascites.  Patient was then admitted to our hospital for further evaluation and treatment.  During hospitalization, patient has been seen by palliative care and oncology.  Has been started on inpatient chemotherapy since 08/05/2023.  Palliative care managing pain at this time   Assessment/Plan: Principal Problem:   Metastatic colon cancer to liver [facility]) Active Problems:   Cancer associated pain   Hypertensive urgency   Palliative care by specialist   Peritoneal carcinomatosis (HCC)   Goals of care, counseling/discussion   Constipation   Intractable pain   Uncontrolled pain  Stage IV sigmoid colon cancer with metastasis and  CBD obstruction Status post robotic low anterior resection in 2021.  History of chemotherapy in the past.  Status post bilateral hepatic stents on 02/26/2023 and GI had recommended follow-up in 6 to 12 months for stent exchange.  Patient was initiated on chemotherapy on 08/05/2023.  Last chemo on 08/07/2023.  Will need to follow-up with oncology after discharge.  Intractable abdominal pain.  Cancer associated pain.   Palliative care on board and on  Dilaudid  PCA.  States that her current pain regimen with Dilaudid  pump is working but usually does not work at home.   On oral Dilaudid  and long-acting OxyContin  at home.  Plan for Dilaudid  pump for next 24 hours as per palliative care.  Plan to wean off and transition to Dilaudid  p.o. concentrated solution plus fentanyl  patch.  Hypertensive urgency Likely secondary to underlying pain.  Diastolic consistently high.  Continue amlodipine , clonidine , metoprolol  and as needed hydralazine .  Hydralazine  dose has been increased to 50 mg 3 times daily.  Allergic to ACE inhibitors.  Hypokalemia Improved.  Latest potassium 4.5.   Bilateral renal calculi - Nonobstructive in nature.  Continue to monitor.   Normocytic anemia   Likely anemia of chronic disease in the setting of metastatic cancer.  Latest hemoglobin of 9.7.   Leukocytosis and thrombocytosis Resolved.  DVT prophylaxis: enoxaparin  (LOVENOX ) injection 40 mg Start: 08/03/23 1000   Disposition: Home when okay likely in 1 to 2 days when okay with palliative care Status is: Inpatient Remains inpatient appropriate because: Pending clinical improvement, PCA pump  Code Status:     Code Status: Full Code  Family Communication: None at bedside.  Consultants: Palliative care Oncology  Procedures: Initiation of chemotherapy  Anti-infectives:  None  Anti-infectives (From admission, onward)    None       Subjective: Today, patient was seen and examined at bedside.  Patient denies interval complaints.  Has some mild abdominal discomfort.  Pain controlled on the current regimen.  On Dilaudid  pump  Objective: Vitals:   08/10/23 1105 08/10/23 1251  BP:  (!) 148/106  Pulse:  79  Resp: 11 16  Temp:  98.5 F (36.9 C)  SpO2: 100% 100%    Intake/Output Summary (Last 24 hours) at 08/10/2023 1413 Last data filed at 08/09/2023 2200 Gross per 24 hour  Intake 556.06 ml  Output --  Net 556.06 ml   Filed Weights   08/02/23 2014  08/03/23 0009  Weight: 54.9 kg 56.7 kg   Body mass index is 21.46 kg/m.   Physical Exam:  General:  Average built, not in obvious distress, Communicative, oriented HENT:   No scleral pallor or icterus noted. Oral mucosa is moist.  Chest:  Clear breath sounds.  Diminished breath sounds bilaterally.  CVS: S1 &S2 heard. No murmur.  Regular rate and rhythm. Abdomen: Soft, nontender, mildly distended abdomen, mild tenderness of.  Bowel sounds are heard.   Extremities: No cyanosis, clubbing or edema.  Peripheral pulses are palpable.  Left upper extremity PICC line in place Psych: Alert, awake and oriented, normal mood CNS:  No cranial nerve deficits.  Power equal in all extremities.   Skin: Warm and dry.  No rashes noted.  Data Review: I have personally reviewed the following laboratory data and studies,  CBC: Recent Labs  Lab 08/04/23 1506 08/05/23 0204 08/06/23 0411 08/07/23 0354 08/08/23 0328 08/09/23 0331 08/10/23 0247  WBC 10.2   < > 9.2 7.5 6.3 9.3 13.5*  NEUTROABS 6.4  --   --  5.4  --  7.2 11.6*  HGB 9.9*   < > 10.6* 10.1* 10.0* 9.7* 10.3*  HCT 31.5*   < > 33.8* 32.2* 32.5* 30.7* 33.1*  MCV 89.0   < > 87.1 87.7 88.8 88.2 89.2  PLT 411*   < > 442* 400 385 365 431*   < > = values in this interval not displayed.   Basic Metabolic Panel: Recent Labs  Lab 08/06/23 0411 08/07/23 0354 08/08/23 0328 08/09/23 0331 08/10/23 0247  NA 130* 131* 135 134* 133*  K 3.7 3.6 3.5 4.5 4.4  CL 93* 96* 96* 95* 95*  CO2 27 27 29 31 27   GLUCOSE 136* 105* 101* 120* 142*  BUN 10 9 9 8 9   CREATININE 0.48 0.46 0.41* 0.37* 0.51  CALCIUM  9.2 8.5* 8.7* 9.0 9.3  MG 1.9 2.1  --   --   --   PHOS  --  3.6  --   --   --    Liver Function Tests: No results for input(s): AST, ALT, ALKPHOS, BILITOT, PROT, ALBUMIN in the last 168 hours.  No results for input(s): LIPASE, AMYLASE in the last 168 hours.  No results for input(s): AMMONIA in the last 168 hours. Cardiac  Enzymes: No results for input(s): CKTOTAL, CKMB, CKMBINDEX, TROPONINI in the last 168 hours. BNP (last 3 results) No results for input(s): BNP in the last 8760 hours.  ProBNP (last 3 results) No results for input(s): PROBNP in the last 8760 hours.  CBG: No results for input(s): GLUCAP in the last 168 hours. Recent Results (from the past 240 hours)  Blood culture (routine x 2)     Status: None   Collection Time: 08/02/23  9:10 PM   Specimen: BLOOD  Result Value Ref Range Status   Specimen Description   Final    BLOOD BLOOD RIGHT HAND Performed at [facility], [address] [place], [place] [address]    Special Requests   Final    BOTTLES DRAWN AEROBIC AND ANAEROBIC Blood Culture adequate volume Performed at [facility], [address] [place], [place] [address]    Culture   Final    NO GROWTH 5 DAYS Performed at [facility], [address] [place], [place] [address]    Report Status 08/07/2023 FINAL  Final  Blood culture (routine x 2)     Status: None   Collection Time: 08/02/23  9:35 PM   Specimen: BLOOD  Result Value Ref Range Status   Specimen Description   Final    BLOOD BLOOD LEFT HAND Performed at [facility], [address], [place], [place] [address]    Special Requests   Final    BOTTLES DRAWN AEROBIC AND ANAEROBIC Blood Culture adequate volume Performed at [facility], [address], [place], [place] [address]    Culture   Final    NO GROWTH 7 DAYS Performed at [facility], [address] [place], [place] [address]    Report Status 08/10/2023 FINAL  Final  Urine Culture     Status: Abnormal   Collection Time: 08/02/23 10:41 PM   Specimen: Urine, Clean Catch  Result Value Ref Range Status   Specimen Description   Final    URINE, CLEAN CATCH Performed at [facility], [address] [place], [place] [address]    Special  Requests   Final    Immunocompromised Performed at [facility], [address] [place], [place] [address]    Culture (A)  Final    <10,000 COLONIES/mL INSIGNIFICANT GROWTH Performed at [facility], [address] [place], [place] [address]    Report Status 08/04/2023 FINAL  Final     Studies: No results found.    Vernal Alstrom, MD  [facility] 08/10/2023  If 7PM-7AM, please contact night-coverage

## 2023-08-11 DIAGNOSIS — Z7189 Other specified counseling: Secondary | ICD-10-CM | POA: Diagnosis not present

## 2023-08-11 DIAGNOSIS — K5900 Constipation, unspecified: Secondary | ICD-10-CM | POA: Diagnosis not present

## 2023-08-11 DIAGNOSIS — C772 Secondary and unspecified malignant neoplasm of intra-abdominal lymph nodes: Secondary | ICD-10-CM

## 2023-08-11 DIAGNOSIS — G893 Neoplasm related pain (acute) (chronic): Secondary | ICD-10-CM | POA: Diagnosis not present

## 2023-08-11 DIAGNOSIS — C189 Malignant neoplasm of colon, unspecified: Secondary | ICD-10-CM | POA: Diagnosis not present

## 2023-08-11 DIAGNOSIS — C787 Secondary malignant neoplasm of liver and intrahepatic bile duct: Secondary | ICD-10-CM | POA: Diagnosis not present

## 2023-08-11 DIAGNOSIS — C19 Malignant neoplasm of rectosigmoid junction: Secondary | ICD-10-CM

## 2023-08-11 DIAGNOSIS — Z515 Encounter for palliative care: Secondary | ICD-10-CM | POA: Diagnosis not present

## 2023-08-11 DIAGNOSIS — C182 Malignant neoplasm of ascending colon: Secondary | ICD-10-CM

## 2023-08-11 LAB — BASIC METABOLIC PANEL WITH GFR
Anion gap: 9 (ref 5–15)
BUN: 12 mg/dL (ref 6–20)
CO2: 30 mmol/L (ref 22–32)
Calcium: 9.3 mg/dL (ref 8.9–10.3)
Chloride: 94 mmol/L — ABNORMAL LOW (ref 98–111)
Creatinine, Ser: 0.48 mg/dL (ref 0.44–1.00)
GFR, Estimated: >60 mL/min (ref >60)
Glucose, Bld: 117 mg/dL — ABNORMAL HIGH (ref 70–99)
Potassium: 4.2 mmol/L (ref 3.5–5.1)
Sodium: 133 mmol/L — ABNORMAL LOW (ref 135–145)

## 2023-08-11 MED ORDER — HYDROMORPHONE HCL 1 MG/ML PO LIQD
4.0000 mg | ORAL | Status: DC | PRN
  Administered 2023-08-11 – 2023-08-12 (×3): 8 mg via ORAL
  Filled 2023-08-11 (×3): qty 8

## 2023-08-11 MED ORDER — LACTULOSE 10 GM/15ML PO SOLN: 10.0000 g | Freq: Every day | ORAL | Status: DC

## 2023-08-11 MED ORDER — HYDROMORPHONE HCL 1 MG/ML IJ SOLN
0.5000 mg | INTRAMUSCULAR | Status: DC | PRN
  Administered 2023-08-11 – 2023-08-12 (×4): 1 mg via INTRAVENOUS
  Filled 2023-08-11 (×4): qty 1

## 2023-08-11 MED ORDER — DEXAMETHASONE SODIUM PHOSPHATE 4 MG/ML IJ SOLN
4.0000 mg | Freq: Every day | INTRAMUSCULAR | Status: DC
  Administered 2023-08-12: 4 mg via INTRAVENOUS
  Filled 2023-08-11: qty 1

## 2023-08-11 MED ORDER — LACTULOSE 10 GM/15ML PO SOLN
20.0000 g | Freq: Every day | ORAL | Status: DC
  Administered 2023-08-11: 20 g via ORAL
  Filled 2023-08-11: qty 30

## 2023-08-11 MED ORDER — ALUM & MAG HYDROXIDE-SIMETH 200-200-20 MG/5ML PO SUSP
30.0000 mL | Freq: Four times a day (QID) | ORAL | Status: DC | PRN
  Administered 2023-08-11: 30 mL via ORAL
  Filled 2023-08-11: qty 30

## 2023-08-11 NOTE — Progress Notes (Signed)
 Daily Progress Note   Patient Name: April [name]       Date: 08/11/2023 DOB: 20-Aug-1975  Age: 48 y.o. MRN#: [id number] Attending Physician: Sonjia Held, MD Primary Care Physician: Oris Camie BRAVO, NP Admit Date: 08/02/2023 Length of Stay: 8 days  Reason for Consultation/Follow-up: Establishing goals of care and Pain control  Subjective:   CC: Patient notes that her pain is overall improved today.  Following up regarding complex medical decision making and symptom management.  Subjective:  Reviewed EMR including recent documentation from hospitalist and oncologist.  At time of EMR review in past 24 hours patient has been receiving IV Dilaudid  PCA boluses at 0.6 mg doses x 34 boluses in past 24 hours.  Patient was started on fentanyl  50 mcg every 72 hour on 08/10/2023.  Presented to bedside to see patient.  Patient lying comfortably in bed.  Able to reintroduce myself as a member of the palliative medicine team.  Able to follow-up on patient's symptom burden at this time.  Patient feels her cancer pain has improved with medication adjustments.  Discussed transitioning from bolus on PCA to oral Dilaudid  solution today.  Patient noted that she had discussed with prior provider but Dilaudid  solution may improve pain over tabs due to absorption.  Noted would try solution to determine if this assists.  Patient previously taking oral Dilaudid  4-8 mg every 4 hours as needed at home.  Noted would start this as solution and encourage patient to try 4 mg dose initially.  Patient agreeing with this plan.  Will discontinue PCA at this time.  Patient hoping that with oral regimen manages her pain, could potentially go home tomorrow.  Patient has been discussing with Dr. Cloretta possibilities of cancer directed therapies moving forward.  Patient is hopeful for possible trial at [facility].  All questions answered at that time.  Noted palliative medicine team to continue to follow with patient's  medical journey.  Discussed care with hospitalist, oncologist, outpatient PMT, pharmacist, and RN to coordinate care.  Review of Systems Cancer pain improved Objective:   Vital Signs:  BP (!) 140/104 (BP Location: Right Arm)   Pulse 65   Temp 98.5 F (36.9 C) (Oral)   Resp 18   Ht 5' 4 (1.626 m)   Wt 56.7 kg   LMP 01/07/2006   SpO2 100%   BMI 21.46 kg/m   Physical Exam: General: NAD, alert Cardiovascular: RRR Respiratory: no increased work of breathing noted, not in respiratory distress Abdomen: not distended Neuro: A&Ox4, following commands easily Psych: appropriately answers all questions   Assessment & Plan:   Assessment: Patient is a 48 year old female with stage IV colon cancer metastatic to the peritoneum and liver, history of biliary obstruction status post stenting, and hypertension who was admitted on 08/02/2023 for worsening abdominal pain.  Patient noted that she had not had a bowel movement for about a week prior to presentation.  Imaging obtained noted progression of disease.  Oncology consulted for recommendations.  Palliative medicine team consulted to assist with complex medical decision making and pain management. Of note patient followed by outpatient PMT  provider at [facility].  Recommendations/Plan: # Complex medical decision making/goals of care:  - Patient has continued conversations with oncologist regarding care planning moving forward.  Patient and oncologist discussing possible trial for patient at [facility].  Patient has continued to accept cancer directed therapies if offered.  -  Code Status: Full Code  # Symptom management: Patient is receiving these palliative interventions for symptom  management with an intent to improve quality of life.                 - Pain, acute on chronic in setting of metastatic colon cancer                               -Continue fentanyl  50 mcg every 72 hour patch                               -Start as needed oral Dilaudid   solution 4-8 mg every 4 hours as needed                               - Start IV Dilaudid  0.5-1 mg every 4 hours as needed breakthrough pain only after oral Dilaudid  solution   - Discontinue PCA                               - Outpatient PMT provider considering possible pain pump.  Would need to follow-up in outpatient setting for further discussions regarding this.                  - Constipation                               -Change lactulose  to 20 mg daily.  Continue to adjust based on patient's symptom burden                               - Continue bisacodyl  suppository 10 mg daily as needed                               - Continue MiraLAX  17 g twice daily                               - Continue senna to 3 tab twice daily   # Psycho-social/Spiritual Support:  - Support System: Mother   # Discharge Planning: Home -Already follows up with outpatient PMT at [facility].  Discussed with: Patient, RN, pharmacist, oncologist, hospitalist, outpatient PMT provider  Thank you for allowing the palliative care team to participate in the care [facility].  Tinnie Radar, DO Palliative Care Provider PMT # [phone]  If patient remains symptomatic despite maximum doses, please call PMT at [phone] between 0700 and 1900. Outside of these hours, please call attending, as PMT does not have night coverage.  Billing based on MDM: High  Problems Addressed: One or more chronic illnesses with severe exacerbation, progression, or side effects of treatment.  Risks: Parenteral controlled substances

## 2023-08-11 NOTE — Progress Notes (Addendum)
 PROGRESS NOTE  Adah Stoneberg FMW:[id number] DOB: [birth year] DOA: 08/02/2023 PCP: Oris Camie BRAVO, NP   LOS: 8 days   Brief narrative:  April [name] is a 48 y.o. 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  In the ED, blood pressure was elevated.  Labs were notable for AST of 48 alkaline phosphatase 661.  Lipase 35.  Lactate of 1.2.  Urinalysis showed negative nitrate but more than 50 RBCs.  CT scan done showed mild rectal soft tissue mass with right hepatic metastasis peritoneal disease and omental caking with a small volume abdominal pelvic ascites.  Patient was then admitted to our hospital for further evaluation and treatment.  During hospitalization, patient has been seen by palliative care and oncology.  Has been started on inpatient chemotherapy since 08/05/2023.  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  History of chemotherapy in the past.  Status post bilateral hepatic stents on 02/26/2023 and GI had recommended follow-up in 6 to 12 months for stent exchange.  Patient was initiated on chemotherapy on 08/05/2023.  Last chemo on 08/07/2023.  Will need to follow-up with oncology after discharge.  Intractable abdominal pain.  Cancer associated pain.   Palliative care on board and was  on Dilaudid  PCA during hospitalization.  Palliative care transitioning to fentanyl  patch, Dilaudid  injection for as needed pain and liquid Dilaudid .  Patient has had recurrent admissions due to pain issues and is important that she had a good regimen prior to discharge.   On oral Dilaudid  and long-acting OxyContin  at home.  Follow-up palliative care recommendations on pain management prior to discharge  Hypertensive urgency Likely secondary to underlying pain.  Diastolic consistently high.  Continue amlodipine , clonidine , metoprolol  and as needed hydralazine .  Hydralazine  dose has been increased to 50 mg 3 times daily.  Allergic to ACE inhibitors.  Latest blood pressure of 127/93 and has improved.  Constipation.  Patient has not had a bowel movement in 3 days.  Does not wish to take lactulose  but was taking MiraLAX .  Explained the importance of having regular bowel movements while on narcotics.  She states that lactulose  makes her hemorrhoids prolapse more but is willing to try decreased dose of 10 g daily.  Will change the lactulose  to 10 g daily from 20 g daily.  Hypokalemia Improved.  Latest potassium 4.2.   Bilateral renal calculi - Nonobstructive in nature.  Continue to monitor.   Normocytic anemia   Likely anemia of chronic disease in the setting of metastatic cancer.  Latest hemoglobin of 10.3 and has remained stable.   Leukocytosis and thrombocytosis Mild present.  No signs of infection  DVT prophylaxis: enoxaparin  (LOVENOX ) injection 40 mg Start: 08/03/23 1000   Disposition: Home when okay likely in 1 to 2 days when okay with palliative care on good pain management regiment  Status is: Inpatient Remains inpatient appropriate because:  Pending clinical improvement, adjusting pain medication regimen,  Code Status:     Code Status: Full Code  Family Communication: None at bedside.  Consultants: Palliative care Oncology  Procedures: Initiation of  chemotherapy  Anti-infectives:  None  Anti-infectives (From admission, onward)    None       Subjective: Today, patient was seen and examined at bedside.  Patient states good control of pain on the current regimen.  Palliative care on board.  States has not had a bowel movement in 3 days and discussed about importance of regulating bowel movement.  Objective: Vitals:   08/11/23 0432 08/11/23 1408  BP:  (!) 127/93  Pulse:  73  Resp: 18 16  Temp:  98.5 F (36.9 C)  SpO2:  100%    Intake/Output Summary (Last 24 hours) at 08/11/2023 1644 Last data filed at 08/11/2023 0200 Gross per 24 hour  Intake 461.58 ml  Output --  Net 461.58 ml   Filed Weights   08/02/23 2014 08/03/23 0009  Weight: 54.9 kg 56.7 kg   Body mass index is 21.46 kg/m.   Physical Exam:  General:  Average built, not in obvious distress, Communicative. HENT:   No scleral pallor or icterus noted. Oral mucosa is moist.  Chest:  Clear breath sounds.  . No crackles or wheezes.  CVS: S1 &S2 heard. No murmur.  Regular rate and rhythm. Abdomen: Soft, mildly distended, mild tenderness noted.  Bowel sounds are heard.   Extremities: No cyanosis, clubbing or edema.  Peripheral pulses are palpable.  Left upper extremity PICC line in place Psych: Alert, awake and oriented, normal mood CNS:  No cranial nerve deficits.  Power equal in all extremities.   Skin: Warm and dry.  No rashes noted.   Data Review: I have personally reviewed the following laboratory data and studies,  CBC: Recent Labs  Lab 08/06/23 0411 08/07/23 0354 08/08/23 0328 08/09/23 0331 08/10/23 0247  WBC 9.2 7.5 6.3 9.3 13.5*  NEUTROABS  --  5.4  --  7.2 11.6*  HGB 10.6* 10.1* 10.0* 9.7* 10.3*  HCT 33.8* 32.2* 32.5* 30.7* 33.1*  MCV 87.1 87.7 88.8 88.2 89.2  PLT 442* 400 385 365 431*   Basic Metabolic Panel: Recent Labs  Lab 08/06/23 0411 08/07/23 0354 08/08/23 0328 08/09/23 0331 08/10/23 0247 08/11/23 0217  NA 130* 131* 135  134* 133* 133*  K 3.7 3.6 3.5 4.5 4.4 4.2  CL 93* 96* 96* 95* 95* 94*  CO2 27 27 29 31 27 30   GLUCOSE 136* 105* 101* 120* 142* 117*  BUN 10 9 9 8 9 12   CREATININE 0.48 0.46 0.41* 0.37* 0.51 0.48  CALCIUM  9.2 8.5* 8.7* 9.0 9.3 9.3  MG 1.9 2.1  --   --   --   --   PHOS  --  3.6  --   --   --   --    Liver Function Tests: No results for input(s): AST, ALT, ALKPHOS, BILITOT, PROT, ALBUMIN in the last 168 hours.  No results for input(s): LIPASE, AMYLASE in the last 168 hours.  No results for input(s): AMMONIA in the last 168 hours. Cardiac Enzymes: No results for input(s): CKTOTAL, CKMB, CKMBINDEX, TROPONINI in the last 168 hours. BNP (last 3 results) No results for input(s): BNP in the last 8760 hours.  ProBNP (last 3 results) No results for input(s): PROBNP in the last 8760 hours.  CBG: No results for input(s): GLUCAP in the last 168 hours. Recent Results (from the past  240 hours)  Blood culture (routine x 2)     Status: None   Collection Time: 08/02/23  9:10 PM   Specimen: BLOOD  Result Value Ref Range Status   Specimen Description   Final    BLOOD BLOOD RIGHT HAND Performed at [facility], [address] [place], [place] [address]    Special Requests   Final    BOTTLES DRAWN AEROBIC AND ANAEROBIC Blood Culture adequate volume Performed at [facility], [address], [place], [place] [address]    Culture   Final    NO GROWTH 5 DAYS Performed at [facility], [address], [place], [place] [address]    Report Status 08/07/2023 FINAL  Final  Blood culture (routine x 2)     Status: None   Collection Time: 08/02/23  9:35 PM   Specimen: BLOOD  Result Value Ref Range Status   Specimen Description   Final    BLOOD BLOOD LEFT HAND Performed at [facility], [address], [place], [place] [address]    Special Requests   Final    BOTTLES DRAWN AEROBIC AND ANAEROBIC Blood  Culture adequate volume Performed at [facility], [address] [place], [place] [address]    Culture   Final    NO GROWTH 7 DAYS Performed at [facility], [address], [place], [place] [address]    Report Status 08/10/2023 FINAL  Final  Urine Culture     Status: Abnormal   Collection Time: 08/02/23 10:41 PM   Specimen: Urine, Clean Catch  Result Value Ref Range Status   Specimen Description   Final    URINE, CLEAN CATCH Performed at [facility], [address] [place], [place] [address]    Special Requests   Final    Immunocompromised Performed at [facility], [address] [place], [place] [address]    Culture (A)  Final    <10,000 COLONIES/mL INSIGNIFICANT GROWTH Performed at [facility], [address], [place], [place] [address]    Report Status 08/04/2023 FINAL  Final     Studies: No results found.    Vernal Alstrom, MD  [facility] 08/11/2023  If 7PM-7AM, please contact night-coverage

## 2023-08-12 ENCOUNTER — Other Ambulatory Visit (HOSPITAL_COMMUNITY): Payer: Self-pay

## 2023-08-12 DIAGNOSIS — C786 Secondary malignant neoplasm of retroperitoneum and peritoneum: Secondary | ICD-10-CM | POA: Diagnosis not present

## 2023-08-12 DIAGNOSIS — I160 Hypertensive urgency: Secondary | ICD-10-CM | POA: Diagnosis not present

## 2023-08-12 DIAGNOSIS — Z515 Encounter for palliative care: Secondary | ICD-10-CM | POA: Diagnosis not present

## 2023-08-12 DIAGNOSIS — C189 Malignant neoplasm of colon, unspecified: Secondary | ICD-10-CM | POA: Diagnosis not present

## 2023-08-12 DIAGNOSIS — K5900 Constipation, unspecified: Secondary | ICD-10-CM | POA: Diagnosis not present

## 2023-08-12 DIAGNOSIS — G893 Neoplasm related pain (acute) (chronic): Secondary | ICD-10-CM | POA: Diagnosis not present

## 2023-08-12 LAB — BASIC METABOLIC PANEL WITH GFR
Anion gap: 13 (ref 5–15)
BUN: 11 mg/dL (ref 6–20)
CO2: 28 mmol/L (ref 22–32)
Calcium: 9.2 mg/dL (ref 8.9–10.3)
Chloride: 89 mmol/L — ABNORMAL LOW (ref 98–111)
Creatinine, Ser: 0.53 mg/dL (ref 0.44–1.00)
GFR, Estimated: >60 mL/min (ref >60)
Glucose, Bld: 94 mg/dL (ref 70–99)
Potassium: 3 mmol/L — ABNORMAL LOW (ref 3.5–5.1)
Sodium: 130 mmol/L — ABNORMAL LOW (ref 135–145)

## 2023-08-12 LAB — CBC
HCT: 34.7 % — ABNORMAL LOW (ref 36.0–46.0)
Hemoglobin: 10.9 g/dL — ABNORMAL LOW (ref 12.0–15.0)
MCH: 27.3 pg (ref 26.0–34.0)
MCHC: 31.4 g/dL (ref 30.0–36.0)
MCV: 86.8 fL (ref 80.0–100.0)
Platelets: 482 K/uL — ABNORMAL HIGH (ref 150–400)
RBC: 4 MIL/uL (ref 3.87–5.11)
RDW: 16.3 % — ABNORMAL HIGH (ref 11.5–15.5)
WBC: 14.1 K/uL — ABNORMAL HIGH (ref 4.0–10.5)
nRBC: 0.1 % (ref 0.0–0.2)

## 2023-08-12 MED ORDER — LACTULOSE 10 GM/15ML PO SOLN: 10.0000 g | Freq: Every day | ORAL | Status: DC

## 2023-08-12 MED ORDER — DEXAMETHASONE 2 MG PO TABS
2.0000 mg | ORAL_TABLET | Freq: Every day | ORAL | 0 refills | Status: DC
  Filled 2023-08-12: qty 30, 30d supply, fill #0

## 2023-08-12 MED ORDER — HYDROMORPHONE HCL 2 MG PO TABS
8.0000 mg | ORAL_TABLET | ORAL | Status: DC | PRN
  Administered 2023-08-12: 8 mg via ORAL
  Filled 2023-08-12: qty 4

## 2023-08-12 MED ORDER — DEXAMETHASONE 2 MG PO TABS: 2.0000 mg | ORAL_TABLET | Freq: Every day | ORAL | Status: DC

## 2023-08-12 MED ORDER — FENTANYL 50 MCG/HR TD PT72
1.0000 | MEDICATED_PATCH | TRANSDERMAL | 0 refills | Status: DC
  Filled 2023-08-12: qty 10, 30d supply, fill #0

## 2023-08-12 MED ORDER — HYDROMORPHONE HCL 2 MG PO TABS
8.0000 mg | ORAL_TABLET | ORAL | Status: DC | PRN
  Administered 2023-08-12 (×2): 8 mg via ORAL
  Filled 2023-08-12 (×2): qty 4

## 2023-08-12 MED ORDER — POTASSIUM CHLORIDE CRYS ER 20 MEQ PO TBCR
40.0000 meq | EXTENDED_RELEASE_TABLET | ORAL | Status: AC
  Administered 2023-08-12 (×2): 40 meq via ORAL
  Filled 2023-08-12 (×2): qty 2

## 2023-08-12 MED ORDER — LACTULOSE 10 GM/15ML PO SOLN: 10.0000 g | Freq: Two times a day (BID) | ORAL | Status: DC

## 2023-08-12 MED ORDER — HYDROMORPHONE HCL 8 MG PO TABS
8.0000 mg | ORAL_TABLET | ORAL | 0 refills | Status: DC | PRN
  Filled 2023-08-12: qty 100, 17d supply, fill #0

## 2023-08-12 NOTE — Progress Notes (Signed)
 AVS reviewed with patient including education about new medications. TOC meds delivered to bedside. Pt verbalized understanding. Pt sent home with PICC in place per order. She verbally confirmed that she had saline flushes at home.

## 2023-08-12 NOTE — TOC Transition Note (Addendum)
 Transition of Care [facility]) - Discharge Note   Patient Details  Name: April [name] MRN: [id number] Date of Birth: 03-26-1975  [facility] CM/SW Contact:  Bascom Service, RN Phone Number: 08/12/2023, 1:51 PM   Clinical Narrative:  d/c home. No CM needs. -2p  Per patient she does her own picc flushes in between her weekly appts. Already receive otpt PCS services.        Final next level of care: Home/Self Care Barriers to Discharge: No Barriers Identified   Patient Goals and CMS Choice Patient states their goals for this hospitalization and ongoing recovery are:: Home CMS Medicare.gov Compare Post Acute Care list provided to:: Patient Choice offered to / list presented to : Patient [place] ownership interest in [facility].provided to:: Patient    Discharge Placement                       Discharge Plan and Services Additional resources added to the After Visit Summary for                                       Social Drivers of Health (SDOH) Interventions SDOH Screenings   Food Insecurity: No Food Insecurity (08/03/2023)  Housing: Low Risk  (08/03/2023)  Transportation Needs: No Transportation Needs (08/03/2023)  Utilities: Not At Risk (08/03/2023)  Alcohol Screen: Low Risk  (11/11/2022)  Depression (PHQ2-9): Low Risk  (08/05/2023)  Financial Resource Strain: Low Risk  (11/11/2022)  Physical Activity: Inactive (11/11/2022)  Social Connections: Socially Isolated (01/24/2023)  Stress: No Stress Concern Present (11/11/2022)  Tobacco Use: High Risk (08/07/2023)   Received from [facility]  Health Literacy: Adequate Health Literacy (11/11/2022)     Readmission Risk Interventions    06/23/2023    3:12 PM  Readmission Risk Prevention Plan  Transportation Screening Complete  Medication Review (RN Care Manager) Complete  PCP or Specialist appointment within 3-5 days of discharge Complete  HRI or Home  Care Consult Complete  SW Recovery Care/Counseling Consult Complete  Palliative Care Screening Not Applicable  Skilled Nursing Facility Not Applicable

## 2023-08-12 NOTE — Discharge Summary (Signed)
 Physician Discharge Summary   April [name] FMW:[id number] DOB: [birth year] DOA: 08/02/2023  PCP: Oris Camie BRAVO, NP  Admit date: 08/02/2023 Discharge date: 08/12/2023  Admitted From: Home Disposition: Home Discharging physician: Alm Apo, MD Barriers to discharge: None  Recommendations at discharge: Continue following with oncology Continue pain management with outpatient palliative care   Discharge Condition: stable CODE STATUS: Full Diet recommendation:  Diet Orders (From admission, onward)     Start     Ordered   08/12/23 0000  Diet general        08/12/23 1404   08/02/23 2326  Diet regular Fluid consistency: Thin  Diet effective now       Question:  Fluid consistency:  Answer:  Thin   08/02/23 2326            Hospital Course: April [name] is a 48 y.o. 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   In the ED, blood pressure was elevated.  Labs were notable for AST of 48 alkaline phosphatase 661.  Lipase 35.  Lactate of 1.2.  Urinalysis showed negative nitrate but more than 50 RBCs.  CT scan done showed mild rectal soft tissue mass with right hepatic metastasis peritoneal disease and omental caking with a small volume abdominal pelvic ascites.   Patient was then admitted for further evaluation and treatment.  During hospitalization, patient has been seen by palliative care and oncology.  Has been started on inpatient chemotherapy since 08/05/2023.  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  History of chemotherapy in the past.  Status post bilateral hepatic stents on 02/26/2023 and GI had recommended follow-up in 6 to 12 months for stent exchange.  Patient was initiated on chemotherapy on 08/05/2023.  Last chemo on 08/07/2023.  Will need to follow-up with oncology after discharge.   Intractable abdominal pain Cancer associated pain Palliative care on board and was on Dilaudid  PCA during hospitalization.  Palliative care transitioning to fentanyl  patch, Dilaudid  injection for as needed pain and liquid Dilaudid .  Patient has had recurrent admissions due to pain issues and is important that she had a good regimen prior to discharge.   On oral Dilaudid  and long-acting OxyContin  at home - Discharged with fentanyl  patch and increased oral Dilaudid    Hypertensive urgency -resolved Likely secondary to underlying pain - BP stable at discharge   Constipation -improved with aggressive laxative regimen.  Patient wishes to modify regimen at discharge as necessary and states she feels comfortable maintaining adequate bowel movements   Hypokalemia -Repleted   Bilateral renal calculi - Nonobstructive in nature   Normocytic anemia   Likely anemia of chronic disease in the setting of metastatic cancer   Leukocytosis and thrombocytosis No signs of infection   The patient's acute and chronic medical conditions were treated accordingly. On day of discharge, patient was felt deemed stable  for discharge. Patient/family member advised to call PCP or come back to ER if needed.   Principal Diagnosis: Metastatic colon cancer to liver [facility])  Discharge Diagnoses: Active Hospital Problems   Diagnosis Date Noted   Metastatic colon cancer to liver (HCC) 03/24/2022    Cancer associated pain 06/21/2023    Priority: 1.   Hypertensive urgency 03/24/2023    Priority: 3.   Uncontrolled pain 08/07/2023   Peritoneal carcinomatosis (HCC) 08/03/2023   Goals of care, counseling/discussion 08/03/2023   Constipation 08/03/2023   Intractable pain 08/03/2023   Palliative care by specialist 06/21/2023    Resolved Hospital Problems  No resolved problems to display.     Discharge Instructions     IS pump d/c appointment request (30 min)   Complete by: Aug 07, 2023    Contact your oncology clinic or infusion center to schedule this appointment.   Continue PICC at discharge   Complete by: As directed    Flush PICC line per home health policy: Yes   Diet general   Complete by: As directed    Increase activity slowly   Complete by: As directed    TREATMENT CONDITION 2   Complete by: As directed    Patient should have magnesium  within 7 days prior to chemotherapy administration. NOTIFY MD if magnesium  level is < 1.7 or if patient has unstable vital signs: Temperature >= 100.35F, SBP > 180 or < 90, RR > 30 or HR > 100.   TREATMENT CONDITIONS   Complete by: As directed    Patient should have CBC & CMP within 7 days prior to chemotherapy administration. NOTIFY MD IF: ANC < 1500, Hemoglobin < 8, PLT < 100,000,  Total Bili > 1.5, Creatinine > 1.5, ALT or  AST > 80 or if patient has unstable vital signs: Temperature >= 100.35F, SBP > 180 or < 90, RR > 30 or HR > 100.      Allergies as of 08/12/2023       Reactions   Lisinopril Swelling, Other (See Comments)   Lip swelling and Angioedema   Irinotecan  Other (See Comments)   Stomach cramping Patient given 0.5 mL atropine  IV and was able to continue infusion.  See Progress note on 10/03/20.    Leucovorin  Calcium  Itching, Other (See Comments)   Patient reported itchy palms towards end of transfusion, see progress note 08/04/22.   Oxaliplatin  Itching, Other (See Comments)   Patient reported itchy palms towards end of  transfusion, see progress note on 08/04/22.        Medication List     STOP taking these medications    Xtampza  ER 18 MG C12a Generic drug: oxyCODONE  ER       TAKE these medications    acetaminophen  500 MG tablet Commonly known as: TYLENOL  Take 500 mg by mouth in the morning, at noon, and at bedtime.   amLODipine  10 MG tablet Commonly known as: NORVASC  Take 1 tablet (10 mg total) by mouth daily.   Antacid Regular Strength 200-200-20 MG/5ML suspension Generic drug: alum & mag hydroxide-simeth Take 30 mLs by mouth every 6 (six) hours as needed for indigestion or heartburn.   cloNIDine  0.1 MG tablet Commonly known as: CATAPRES  Take 3 tablets (0.3 mg total) by mouth 3 (three) times daily.   dexamethasone  2 MG tablet Commonly known as: DECADRON  Take 1 tablet (2 mg total) by mouth daily. Start taking on: August 13, 2023 What changed:  medication strength how much to take when to take  this additional instructions   dicyclomine  20 MG tablet Commonly known as: BENTYL  Take 1 tablet (20 mg total) by mouth 3 (three) times daily as needed for spasms (rectal/AB pain). What changed: when to take this   docusate sodium  100 MG capsule Commonly known as: COLACE Take 1 capsule (100 mg total) by mouth 2 (two) times daily.   fentaNYL  50 MCG/HR Commonly known as: DURAGESIC  Place 1 patch onto the skin every 3 (three) days. Start taking on: August 13, 2023   HYDROmorphone  8 MG tablet Commonly known as: DILAUDID  Take 1 tablet (8 mg total) by mouth every 3 (three) hours as needed for moderate pain (pain score 4-6) or severe pain (pain score 7-10). What changed:  medication strength how much to take when to take this reasons to take this   lactulose  10 GM/15ML solution Commonly known as: CHRONULAC  Take 15-30 mLs (10-20 g total) by mouth daily as needed for mild constipation. What changed:  how much to take when to take this   metoprolol  tartrate 100 MG tablet Commonly known  as: LOPRESSOR  Take 1 tablet (100 mg total) by mouth 2 (two) times daily.   Normal Saline Flush 0.9 % Soln Use 5 mLs by Intracatheter route daily in biliary drain at 6 (six) AM as directed What changed: when to take this   pantoprazole  40 MG tablet Commonly known as: PROTONIX  Take 1 tablet (40 mg total) by mouth daily before breakfast.   polyethylene glycol powder 17 GM/SCOOP powder Commonly known as: GLYCOLAX /MIRALAX  Take 17 g by mouth daily as needed.   potassium chloride  10 MEQ CR capsule Commonly known as: MICRO-K  Take 2 capsules (20 mEq total) by mouth 2 (two) times daily. What changed:  how much to take when to take this   prochlorperazine  10 MG tablet Commonly known as: COMPAZINE  Take 1 tablet (10 mg total) by mouth every 6 (six) hours as needed for nausea or vomiting.   senna 8.6 MG Tabs tablet Commonly known as: SENOKOT Take 2 tablets (17.2 mg total) by mouth at bedtime.        Allergies  Allergen Reactions   Lisinopril Swelling and Other (See Comments)    Lip swelling and Angioedema   Irinotecan  Other (See Comments)    Stomach cramping Patient given 0.5 mL atropine  IV and was able to continue infusion.  See Progress note on 10/03/20.    Leucovorin  Calcium  Itching and Other (See Comments)    Patient reported itchy palms towards end of transfusion, see progress note 08/04/22.    Oxaliplatin  Itching and Other (See Comments)    Patient reported itchy palms towards end of transfusion, see progress note on 08/04/22.    Consultations: Palliative care Oncology  Procedures:   Discharge Exam: BP 117/88 (BP Location: Right Arm)   Pulse 82   Temp 98.5 F (36.9 C)   Resp 16   Ht 5' 4 (1.626 m)   Wt 56.7 kg   LMP 01/07/2006   SpO2 100%   BMI 21.46 kg/m  Physical Exam Constitutional:      Appearance: Normal appearance.  HENT:     Head: Normocephalic and atraumatic.     Mouth/Throat:     Mouth: Mucous membranes are moist.  Eyes:     Extraocular  Movements: Extraocular movements intact.  Cardiovascular:     Rate and Rhythm: Normal rate and regular rhythm.  Pulmonary:     Effort: Pulmonary effort is normal. No respiratory distress.     Breath sounds: Normal breath  sounds. No wheezing.  Abdominal:     General: Bowel sounds are normal. There is no distension.     Palpations: Abdomen is soft.     Tenderness: There is abdominal tenderness.  Musculoskeletal:        General: Normal range of motion.     Cervical back: Normal range of motion and neck supple.  Skin:    General: Skin is warm and dry.  Neurological:     General: No focal deficit present.     Mental Status: She is alert.  Psychiatric:        Mood and Affect: Mood normal.      The results of significant diagnostics from this hospitalization (including imaging, microbiology, ancillary and laboratory) are listed below for reference.   Microbiology: Recent Results (from the past 240 hours)  Blood culture (routine x 2)     Status: None   Collection Time: 08/02/23  9:10 PM   Specimen: BLOOD  Result Value Ref Range Status   Specimen Description   Final    BLOOD BLOOD RIGHT HAND Performed at [facility], [address] [place], [place] [address]    Special Requests   Final    BOTTLES DRAWN AEROBIC AND ANAEROBIC Blood Culture adequate volume Performed at [facility], [address] [place], [place] [address]    Culture   Final    NO GROWTH 5 DAYS Performed at [facility], [address], [place], [place] [address]    Report Status 08/07/2023 FINAL  Final  Blood culture (routine x 2)     Status: None   Collection Time: 08/02/23  9:35 PM   Specimen: BLOOD  Result Value Ref Range Status   Specimen Description   Final    BLOOD BLOOD LEFT HAND Performed at [facility], [address], [place], [place] [address]    Special Requests   Final    BOTTLES DRAWN AEROBIC AND ANAEROBIC Blood Culture adequate  volume Performed at [facility], [address], [place], [place] [address]    Culture   Final    NO GROWTH 7 DAYS Performed at [facility], [address], [place], [place] [address]    Report Status 08/10/2023 FINAL  Final  Urine Culture     Status: Abnormal   Collection Time: 08/02/23 10:41 PM   Specimen: Urine, Clean Catch  Result Value Ref Range Status   Specimen Description   Final    URINE, CLEAN CATCH Performed at [facility], [address] [place], [place] [address]    Special Requests   Final    Immunocompromised Performed at [facility], [address], [place], [place] [address]    Culture (A)  Final    <10,000 COLONIES/mL INSIGNIFICANT GROWTH Performed at [facility], [address] [place], [place] [address]    Report Status 08/04/2023 FINAL  Final     Labs: BNP (last 3 results) No results for input(s): BNP in the last 8760 hours. Basic Metabolic Panel: Recent Labs  Lab 08/06/23 0411 08/07/23 0354 08/08/23 0328 08/09/23 0331 08/10/23 0247 08/11/23 0217 08/12/23 0408  NA 130* 131* 135 134* 133* 133* 130*  K 3.7 3.6 3.5 4.5 4.4 4.2 3.0*  CL 93* 96* 96* 95* 95* 94* 89*  CO2 27 27 29 31 27 30 28   GLUCOSE 136* 105* 101* 120* 142* 117* 94  BUN 10 9 9  8  9 12 11   CREATININE 0.48 0.46 0.41* 0.37* 0.51 0.48 0.53  CALCIUM  9.2 8.5* 8.7* 9.0 9.3 9.3 9.2  MG 1.9 2.1  --   --   --   --   --   PHOS  --  3.6  --   --   --   --   --    Liver Function Tests: No results for input(s): AST, ALT, ALKPHOS, BILITOT, PROT, ALBUMIN in the last 168 hours. No results for input(s): LIPASE, AMYLASE in the last 168 hours. No results for input(s): AMMONIA in the last 168 hours. CBC: Recent Labs  Lab 08/07/23 0354 08/08/23 0328 08/09/23 0331 08/10/23 0247 08/12/23 0408  WBC 7.5 6.3 9.3 13.5* 14.1*  NEUTROABS 5.4  --  7.2 11.6*  --   HGB 10.1* 10.0* 9.7* 10.3* 10.9*  HCT 32.2*  32.5* 30.7* 33.1* 34.7*  MCV 87.7 88.8 88.2 89.2 86.8  PLT 400 385 365 431* 482*   Cardiac Enzymes: No results for input(s): CKTOTAL, CKMB, CKMBINDEX, TROPONINI in the last 168 hours. BNP: Invalid input(s): POCBNP CBG: No results for input(s): GLUCAP in the last 168 hours. D-Dimer No results for input(s): DDIMER in the last 72 hours. Hgb A1c No results for input(s): HGBA1C in the last 72 hours. Lipid Profile No results for input(s): CHOL, HDL, LDLCALC, TRIG, CHOLHDL, LDLDIRECT in the last 72 hours. Thyroid function studies No results for input(s): TSH, T4TOTAL, T3FREE, THYROIDAB in the last 72 hours.  Invalid input(s): FREET3 Anemia work up No results for input(s): VITAMINB12, FOLATE, FERRITIN, TIBC, IRON, RETICCTPCT in the last 72 hours. Urinalysis    Component Value Date/Time   COLORURINE YELLOW 08/02/2023 2147   APPEARANCEUR CLOUDY (A) 08/02/2023 2147   LABSPEC 1.036 (H) 08/02/2023 2147   PHURINE 7.0 08/02/2023 2147   GLUCOSEU NEGATIVE 08/02/2023 2147   HGBUR MODERATE (A) 08/02/2023 2147   BILIRUBINUR NEGATIVE 08/02/2023 2147   KETONESUR NEGATIVE 08/02/2023 2147   PROTEINUR 30 (A) 08/02/2023 2147   NITRITE NEGATIVE 08/02/2023 2147   LEUKOCYTESUR NEGATIVE 08/02/2023 2147   Sepsis Labs Recent Labs  Lab 08/08/23 0328 08/09/23 0331 08/10/23 0247 08/12/23 0408  WBC 6.3 9.3 13.5* 14.1*   Microbiology Recent Results (from the past 240 hours)  Blood culture (routine x 2)     Status: None   Collection Time: 08/02/23  9:10 PM   Specimen: BLOOD  Result Value Ref Range Status   Specimen Description   Final    BLOOD BLOOD RIGHT HAND Performed at [facility], [address], [place], [place] [address]    Special Requests   Final    BOTTLES DRAWN AEROBIC AND ANAEROBIC Blood Culture adequate volume Performed at [facility], [address] [place], [place] [address]    Culture    Final    NO GROWTH 5 DAYS Performed at [facility], [address], [place], [place] [address]    Report Status 08/07/2023 FINAL  Final  Blood culture (routine x 2)     Status: None   Collection Time: 08/02/23  9:35 PM   Specimen: BLOOD  Result Value Ref Range Status   Specimen Description   Final    BLOOD BLOOD LEFT HAND Performed at [facility], [address] [place], [place] [address]    Special Requests   Final    BOTTLES DRAWN AEROBIC AND ANAEROBIC Blood Culture adequate volume Performed at [facility], [address], [place], [place] [address]  Culture   Final    NO GROWTH 7 DAYS Performed at [facility], [address] [place], [place] [address]    Report Status 08/10/2023 FINAL  Final  Urine Culture     Status: Abnormal   Collection Time: 08/02/23 10:41 PM   Specimen: Urine, Clean Catch  Result Value Ref Range Status   Specimen Description   Final    URINE, CLEAN CATCH Performed at [facility], [address], [place], [place] [address]    Special Requests   Final    Immunocompromised Performed at [facility], [address] [place], [place] [address]    Culture (A)  Final    <10,000 COLONIES/mL INSIGNIFICANT GROWTH Performed at [facility], [address] [place], [place] [address]    Report Status 08/04/2023 FINAL  Final    Procedures/Studies: Abd 1 View (KUB) Result Date: 08/03/2023 CLINICAL DATA:  48 year old female with constipation. Metastatic colon cancer. EXAM: ABDOMEN - 1 VIEW COMPARISON:  CT Abdomen and Pelvis last night. FINDINGS: Supine AP view of the lower abdomen and pelvis at 0509 hours. Excreted IV contrast now in the urinary bladder. Plastic biliary stents redemonstrated. Nephrolithiasis redemonstrated. Bowel-gas pattern unchanged from the CT Abdomen and Pelvis last night. Stable visualized osseous structures. IMPRESSION: 1. Unchanged  bowel-gas pattern from CT last night. 2. Redemonstrated biliary stents, nephrolithiasis. Electronically Signed   By: VEAR Hurst M.D.   On: 08/03/2023 05:50   CT ABDOMEN PELVIS W CONTRAST Result Date: 08/02/2023 EXAM: CT ABDOMEN AND PELVIS WITH CONTRAST 08/02/2023 09:22:06 PM TECHNIQUE: CT of the abdomen and pelvis was performed with the administration of intravenous contrast. Multiplanar reformatted images are provided for review. Automated exposure control, iterative reconstruction, and/or weight based adjustment of the mA/kV was utilized to reduce the radiation dose to as low as reasonably achievable. COMPARISON: 06/21/2023 CLINICAL HISTORY: Abdominal pain, acute, nonlocalized; Bowel obstruction suspected; stage 4 colon cancer, abdominal pain, unable to defecate. FINDINGS: LOWER CHEST: No acute abnormality. LIVER: Scattered right hepatic metastases measuring up to 18 mm and fairly in the right hepatic lobe, progressive. Mild central intrahepatic ductal dilatation, mildly progressive, with two indwelling common duct stents. GALLBLADDER AND BILE DUCTS: Gallbladder is unremarkable. No biliary ductal dilatation. SPLEEN: No acute abnormality. PANCREAS: No acute abnormality. ADRENAL GLANDS: No acute abnormality. KIDNEYS, URETERS AND BLADDER: 18 mm simple left lower pole renal cyst (image 38 ), benign (Bosniak 1 ). No follow up is recommended. Bilateral nonobstructing renal calculi measuring up to 10 mm in the left upper kidney and right lower kidney. No hydronephrosis. No perinephric or periureteral stranding. Urinary bladder is unremarkable. GI AND BOWEL: Suspected soft tissue mass along the mid rectum with adjacent peritoneal nodularity along the posterior uterus, favored to be mildly progressive. PERITONEUM AND RETROPERITONEUM: Trace perihepatic ascites. Scattered peritoneal disease including omental caking beneath the right anterior abdominal wall, grossly unchanged. Small volume pelvic ascites, with a 6.6 cm  loculated fluid at the lesion in the left deep pelvis, mildly progressive. VASCULATURE: Aorta is normal in caliber. LYMPH NODES: No lymphadenopathy. REPRODUCTIVE ORGANS: Soft tissue nodularity along the posterior uterus, as above. BONES AND SOFT TISSUES: No acute osseous abnormality. No focal soft tissue abnormality. IMPRESSION: 1. Mid rectal soft tissue mass, mildly progressive. 2. Right hepatic metastases, progressive. 3. Peritoneal disease/omental caking, stable versus mildly progressive. 4. Small volume abdominopelvic ascites, mildly progressive. Electronically signed by: Pinkie Pebbles MD 08/02/2023 09:40 PM EDT RP Workstation: [id number]     Time  coordinating discharge: Over 30 minutes    Alm Apo, MD  [facility] 08/12/2023, 3:33 PM

## 2023-08-12 NOTE — Progress Notes (Signed)
 Daily Progress Note   Patient Name: April [name]       Date: 08/12/2023 DOB: 23-Sep-1975  Age: 48 y.o. MRN#: [id number] Attending Physician: Patsy Lenis, MD Primary Care Physician: Oris Camie BRAVO, NP Admit Date: 08/02/2023 Length of Stay: 9 days  Reason for Consultation/Follow-up: Establishing goals of care and Pain control  Subjective:   Reviewed EMR prior to presentation to bedside including recent documentation from hospitalist.  Discussed care with hospitalist and RN to coordinate.  RN messaged earlier in day and noted patient does not feel solution is improving pain so patient requesting to transition back to oral Dilaudid  pill.  Present to bedside to see patient.  Patient lying comfortably in bed.  Patient discussed with this provider that she does not feel solution of Dilaudid  assist with pain and itching her pain more over the Dilaudid  pill.  Noted would continue as needed oral Dilaudid  8 mg every 3 hours as needed as short acting opioid.  Discussed with patient that she is receiving a fentanyl  patch at this time so that will now be her long-acting medication.  Patient is eager to go home today.  Noted would reach out to outpatient PMT provider to assist with coordination for follow-up in outpatient clinic to continue adjustments of medications as needed. Spent time counseling patient on need to have regular bowel movements and use of lactulose  if needed to have bowel movements daily.  Patient to continue on senna and MiraLAX  schedule.  Discussed that if patient waits until she feels constipated to take lactulose , that is already too late because that immediately worsens her pain.  Spent time answering questions as able.  Noted palliative medicine team would be available if needed.   Discussed care with hospitalist, TOC, outpatient PMT, pharmacist and RN to coordinate care.  Planning on patient discharging today.  Pharmacy to provide with medications to bedside prior to  discharge.  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  Patient noted that she had not had a bowel movement for about a week prior to presentation.  Imaging obtained noted progression of disease.  Oncology consulted for recommendations.  Palliative medicine team consulted to assist with complex medical decision making and pain management. Of note patient followed by outpatient PMT  provider at [facility].  Recommendations/Plan: # Complex medical decision making/goals of care:  - Patient has continued conversations with oncologist regarding care planning moving forward.  Patient and oncologist discussing possible trial for patient at [facility].  Patient has continued to accept cancer directed therapies if offered.  -  Code Status: Full Code  # Symptom management: Patient is receiving these palliative interventions for symptom management with an intent to improve quality of life.                 -  Pain, acute on chronic in setting of metastatic colon cancer                               -Continue fentanyl  50 mcg every 72 hour patch                               - Change as needed oral Dilaudid  to 8 mg tablet every 3 hours as needed                               - Discontinue IV Dilaudid                                  - Outpatient PMT provider considering possible pain pump.  Would need to follow-up in outpatient setting for further discussions regarding this.                  - Constipation                               -Change lactulose  to 10 mg daily.  Continue to adjust based on patient's symptom burden                               - Continue bisacodyl  suppository 10 mg daily as needed                               - Continue MiraLAX  17 g twice daily                               - Continue senna to 3 tab twice daily   # Psycho-social/Spiritual Support:  - Support System: Mother   # Discharge Planning: Home today -Already follows up with outpatient PMT at [facility].  Discussed with: Patient, RN, pharmacist, hospitalist, outpatient PMT provider  Thank you for allowing the palliative care team to participate in the care [facility].  Tinnie Radar, DO Palliative Care Provider PMT # [phone]  If patient remains symptomatic despite maximum doses, please call PMT at [phone] between 0700 and 1900. Outside of these hours, please call attending, as PMT does not have night coverage.

## 2023-08-13 NOTE — Transitions of Care (Post Inpatient/ED Visit) (Unsigned)
 08/13/2023  Patient ID: April [name], female   DOB: 1975-09-25, 48 y.o.   MRN: [id number]  RN CM NOTE:  Prior RN CM TOC outreach post discharge, patient stated she did not want to be called for TOC follow up calls again. Per chart review of TOC notes.  Will honor last request not to call patient. She is heavily followed by oncology, palliative providers and stated she does not need or want the additional calls last successful outreach.    Bing Edison MSN, RN RN [employer]  [facility]Population [facility] Office Hours M-F [phone] Direct Dial: [phone] Main Phone [phone]  Fax: [phone] [email or web address]

## 2023-08-14 ENCOUNTER — Other Ambulatory Visit: Payer: Self-pay | Admitting: *Deleted

## 2023-08-14 ENCOUNTER — Other Ambulatory Visit (HOSPITAL_BASED_OUTPATIENT_CLINIC_OR_DEPARTMENT_OTHER): Payer: Self-pay

## 2023-08-14 DIAGNOSIS — C19 Malignant neoplasm of rectosigmoid junction: Secondary | ICD-10-CM

## 2023-08-15 ENCOUNTER — Other Ambulatory Visit: Payer: Self-pay | Admitting: Oncology

## 2023-08-15 ENCOUNTER — Other Ambulatory Visit: Payer: Self-pay | Admitting: Nurse Practitioner

## 2023-08-15 DIAGNOSIS — C20 Malignant neoplasm of rectum: Secondary | ICD-10-CM

## 2023-08-15 DIAGNOSIS — C19 Malignant neoplasm of rectosigmoid junction: Secondary | ICD-10-CM

## 2023-08-17 ENCOUNTER — Encounter: Payer: Self-pay | Admitting: Nurse Practitioner

## 2023-08-17 ENCOUNTER — Other Ambulatory Visit: Payer: Self-pay

## 2023-08-17 ENCOUNTER — Telehealth: Payer: Self-pay | Admitting: *Deleted

## 2023-08-17 ENCOUNTER — Encounter: Payer: Self-pay | Admitting: *Deleted

## 2023-08-17 ENCOUNTER — Other Ambulatory Visit (HOSPITAL_COMMUNITY): Payer: Self-pay

## 2023-08-17 ENCOUNTER — Inpatient Hospital Stay: Attending: Oncology | Admitting: Nurse Practitioner

## 2023-08-17 ENCOUNTER — Encounter: Payer: Self-pay | Admitting: Oncology

## 2023-08-17 DIAGNOSIS — R530 Neoplastic (malignant) related fatigue: Secondary | ICD-10-CM | POA: Diagnosis not present

## 2023-08-17 DIAGNOSIS — R1110 Vomiting, unspecified: Secondary | ICD-10-CM | POA: Insufficient documentation

## 2023-08-17 DIAGNOSIS — Z515 Encounter for palliative care: Secondary | ICD-10-CM | POA: Diagnosis not present

## 2023-08-17 DIAGNOSIS — Z79631 Long term (current) use of antimetabolite agent: Secondary | ICD-10-CM | POA: Insufficient documentation

## 2023-08-17 DIAGNOSIS — C786 Secondary malignant neoplasm of retroperitoneum and peritoneum: Secondary | ICD-10-CM | POA: Insufficient documentation

## 2023-08-17 DIAGNOSIS — G893 Neoplasm related pain (acute) (chronic): Secondary | ICD-10-CM | POA: Diagnosis not present

## 2023-08-17 DIAGNOSIS — R109 Unspecified abdominal pain: Secondary | ICD-10-CM | POA: Insufficient documentation

## 2023-08-17 DIAGNOSIS — C787 Secondary malignant neoplasm of liver and intrahepatic bile duct: Secondary | ICD-10-CM | POA: Insufficient documentation

## 2023-08-17 DIAGNOSIS — C187 Malignant neoplasm of sigmoid colon: Secondary | ICD-10-CM | POA: Insufficient documentation

## 2023-08-17 DIAGNOSIS — K5903 Drug induced constipation: Secondary | ICD-10-CM

## 2023-08-17 DIAGNOSIS — Z79630 Long term (current) use of alkylating agent: Secondary | ICD-10-CM | POA: Insufficient documentation

## 2023-08-17 DIAGNOSIS — C189 Malignant neoplasm of colon, unspecified: Secondary | ICD-10-CM

## 2023-08-17 DIAGNOSIS — C7989 Secondary malignant neoplasm of other specified sites: Secondary | ICD-10-CM | POA: Insufficient documentation

## 2023-08-17 DIAGNOSIS — C7962 Secondary malignant neoplasm of left ovary: Secondary | ICD-10-CM | POA: Insufficient documentation

## 2023-08-17 DIAGNOSIS — C7951 Secondary malignant neoplasm of bone: Secondary | ICD-10-CM | POA: Insufficient documentation

## 2023-08-17 MED ORDER — DOCUSATE SODIUM 100 MG PO CAPS
100.0000 mg | ORAL_CAPSULE | Freq: Two times a day (BID) | ORAL | 3 refills | Status: DC
  Filled 2023-08-17 – 2023-09-25 (×3): qty 60, 30d supply, fill #0

## 2023-08-17 NOTE — Progress Notes (Signed)
 Palliative Medicine [facility]  [phone] [phone]   Name: Chenee Munns Date: 08/17/2023 MRN: [id number]  DOB: 1975/07/06  Patient Care Team: Early, Camie BRAVO, NP as PCP - General (Nurse Practitioner) Cloretta Arley NOVAK, MD as Consulting Physician (Oncology) Pickenpack-Cousar, Fannie SAILOR, NP as Nurse Practitioner [facility] and Palliative Medicine)   I connected with Remigio Rosaline Ellen on 08/17/23 at 12:00 PM EDT by telephone and verified that I am speaking with the correct person using two identifiers.   I discussed the limitations, risks, security and privacy concerns of performing an evaluation and management service by telemedicine and the availability of in-person appointments. I also discussed with the patient that there may be a patient responsible charge related to this service. The patient expressed understanding and agreed to proceed.   Other persons participating in the visit and their role in the encounter: N/A   Patient's location: Home   Provider's location: [facility]    INTERVAL HISTORY: Roxana Lai is a 48 y.o. 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 Palliative is seeing patient for symptom management and goals of care   SOCIAL HISTORY:     reports that she has quit smoking. Her smoking use included cigarettes. She has never used smokeless tobacco. She reports that she does not currently use alcohol. She reports that she does not currently use drugs after having used the following drugs: Marijuana.  ADVANCE DIRECTIVES:  None on file   CODE STATUS: Full  code  PAST MEDICAL HISTORY: Past Medical History:  Diagnosis Date   Ascending aortic aneurysm (HCC) 12/16/2022   Benign essential hypertension 10/06/2017   Last Assessment & Plan:  Formatting of this note might be different from the original. Patient's blood pressure noted to be elevated at today's visit 188/98.  Patient will be provided with antihypertensives in the treatment center if needed in order to meet her Avastin  parameters.  Patient recommended to follow up with her primary care physician regarding medication management.  Patient is not cur   Cancer (HCC)    Change in bowel habits 06/28/2022   Colon cancer (HCC)    History of colon cancer 06/28/2022   Hypertension    Prolonged QT interval 01/07/2018   Renal calculi    bilateral   Sepsis due to undetermined organism (HCC) 12/24/2022   Sialoadenitis of submandibular gland 12/16/2022    ALLERGIES:  is allergic to lisinopril, irinotecan , leucovorin  calcium , and oxaliplatin .  MEDICATIONS:  Current Outpatient Medications  Medication Sig Dispense Refill   acetaminophen  (TYLENOL ) 500 MG tablet Take 500 mg by mouth in the morning, at noon, and at bedtime.     alum & mag hydroxide-simeth (MAALOX/MYLANTA) 200-200-20 MG/5ML suspension Take 30 mLs by mouth every 6 (six) hours as needed for indigestion or heartburn. (Patient not taking: No sig reported) 355 mL 0   amLODipine  (NORVASC ) 10 MG tablet Take 1 tablet (10 mg total) by mouth daily. 90 tablet 0   cloNIDine  (CATAPRES ) 0.1 MG tablet Take 3 tablets (0.3 mg total) by mouth 3 (three) times daily. 90 tablet 2   dexamethasone  (DECADRON ) 2 MG tablet Take 1 tablet (2 mg total) by mouth daily. 30 tablet 0   dicyclomine  (BENTYL ) 20  MG tablet Take 1 tablet (20 mg total) by mouth 3 (three) times daily as needed for spasms (rectal/AB pain). (Patient taking differently: Take 20 mg by mouth daily as needed for spasms (rectal/AB pain).) 60 tablet 1   docusate sodium  (COLACE) 100 MG capsule Take 1  capsule (100 mg total) by mouth 2 (two) times daily. (Patient not taking: No sig reported) 10 capsule 0   fentaNYL  (DURAGESIC ) 50 MCG/HR Place 1 patch onto the skin every 3 (three) days. 10 patch 0   HYDROmorphone  (DILAUDID ) 8 MG tablet Take 1 tablet (8 mg total) by mouth every 3 (three) hours as needed for moderate pain (pain score 4-6) or severe pain (pain score 7-10). 100 tablet 0   lactulose  (CHRONULAC ) 10 GM/15ML solution Take 15-30 mLs (10-20 g total) by mouth daily as needed for mild constipation. (Patient taking differently: Take 20 g by mouth 2 (two) times daily.) 236 mL 2   metoprolol  tartrate (LOPRESSOR ) 100 MG tablet Take 1 tablet (100 mg total) by mouth 2 (two) times daily. 30 tablet 1   pantoprazole  (PROTONIX ) 40 MG tablet Take 1 tablet (40 mg total) by mouth daily before breakfast. (Patient not taking: Reported on 08/03/2023) 30 tablet 6   polyethylene glycol powder (GLYCOLAX /MIRALAX ) 17 GM/SCOOP powder Take 17 g by mouth daily as needed. (Patient not taking: No sig reported) 238 g 0   potassium chloride  (MICRO-K ) 10 MEQ CR capsule Take 2 capsules (20 mEq total) by mouth 2 (two) times daily. (Patient taking differently: Take 30 mEq by mouth in the morning, at noon, and at bedtime.) 120 capsule 2   prochlorperazine  (COMPAZINE ) 10 MG tablet Take 1 tablet (10 mg total) by mouth every 6 (six) hours as needed for nausea or vomiting. (Patient not taking: Reported on 08/03/2023) 60 tablet 3   senna (SENOKOT) 8.6 MG TABS tablet Take 2 tablets (17.2 mg total) by mouth at bedtime. (Patient not taking: No sig reported) 120 tablet 0   Sodium Chloride  Flush (NORMAL SALINE FLUSH) 0.9 % SOLN Use 5 mLs by Intracatheter route daily in biliary drain at 6 (six) AM as directed (Patient taking differently: 5 mLs by Intracatheter route every 3 (three) days.) 150 mL 3   No current facility-administered medications for this visit.    VITAL SIGNS: LMP 01/07/2006  There were no vitals filed for this visit.   Estimated body mass index is 21.46 kg/m as calculated from the following:   Height as of 08/03/23: 5' 4 (1.626 m).   Weight as of 08/03/23: 125 lb (56.7 kg).   PERFORMANCE STATUS (ECOG) : 1 - Symptomatic but completely ambulatory  IMPRESSION: Discussed the use of AI scribe software for clinical note transcription with the patient, who gave verbal consent to proceed.  History of Present Illness Adair Nanci Lakatos is a 48 year old female who I connected by phone with for symptom management follow-up. She is complaining of some abdominal pain and constipation. Reports today is not the best day to the point she does not have the energy to feel like walking. Appetite fluctuates. Some days are better than others. Denies concerns for nausea, vomiting, or diarrhea.  Patient recently hospitalized and seen by our team for symptom management and complex medical decision making.  She experiences significant abdominal pain and a sensation of fullness in her stomach, which makes it difficult for her to breathe and walk around at times. The pain has been present since early morning, around 4 or 5 AM, and worsens when her  bowels do not move.  She has irregular bowel movements, with some days having no movement and other days having good movement. She takes lactulose , 10-15 mL, two to three times a day, which helps release gas but does not always result in bowel movements. She also takes two 8.6 mg Senokot tablets, which seem to work but not consistently. Her mother provided her with some magnesium  citrate, which she takes in small amounts to help with her symptoms. Despite these measures, she feels that her bowel movements are not sufficient given her food intake.  We discussed use of her lactulose . Casilda reports having a small bowel movement on yesterday. Encouraged patient to focus on rigorous bowel regimen given pain regimen. This should include lactulose  15-30mg  twice daily, Senna S2 tablets  twice daily.  Patient verbalized understanding and plans to adjust today.  She knows to contact office if no improvement in the next 24-48 hours.  May have to consider use of Amitiza, Linzess  or Movantik if not trialed.  For pain management, she takes two 8 mg Dilaudid  tablets, which help but are not always effective, especially at night. She also uses a 50 microgram fentanyl  patch, which she changes every three days, with the last change being yesterday.  Given bulk of patient's pain most likely is related to constipation in addition to her fatigue no adjustments to regimen at this time.  We will continue to closely monitor.  All questions answered and support provided.  Assessment & Plan Constipation Chronic constipation with intermittent relief. She takes lactulose  10-15 mL two to three times daily and Senokot 8.6 mg tablets. Incomplete relief with some days of no bowel movement, abdominal discomfort, and bloating. Discussed increasing lactulose  dosage to improve bowel movement regularity while avoiding diarrhea. - Increase lactulose  to 15-30 mL twice daily if current dose is ineffective, with caution to avoid diarrhea. - Continue Senokot 8.6 mg, two tablets twice daily as needed.  Cancer pain Pain managed with Dilaudid  8 mg and fentanyl  patch 50 mcg. She reports adequate pain control with Dilaudid  but notes occasional ineffectiveness, particularly at night. Fentanyl  patch changed every three days. Advised to not delay taking Dilaudid  to maintain effective pain control. - Advise taking Dilaudid  every three hours as needed for pain control. - Continue fentanyl  patch 50 mcg, change every three days.  I will plan to see patient back in 1-2 weeks.  Sooner if needed.  Reviewed her upcoming appointments including appointment tomorrow with Dr. Cloretta at [facility].  Patient expressed understanding and was in agreement with this plan. She also understands that She can call the clinic at any time with  any questions, concerns, or complaints.   Any controlled substances utilized were prescribed in the context of palliative care. PDMP has been reviewed.   I provided 35 minutes of non face-to-face telephone visit time during this encounter, and > 50% was spent counseling as documented under my assessment & plan.  Visit consisted of counseling and education dealing with the complex and emotionally intense issues of symptom management and palliative care in the setting of serious and potentially life-threatening illness.  Levon Borer, AGPCNP-BC  Palliative Medicine Team/[facility]

## 2023-08-17 NOTE — Telephone Encounter (Signed)
 Unable to reach patient via phone today. Sent Mychart message asking where to call in famotidine  20 mg to take daily x 2 days prior to chemo on 8/6 and if she has benadryl  25 mg at home.

## 2023-08-18 ENCOUNTER — Telehealth: Payer: Self-pay | Admitting: Oncology

## 2023-08-18 ENCOUNTER — Ambulatory Visit: Admitting: Oncology

## 2023-08-18 ENCOUNTER — Inpatient Hospital Stay

## 2023-08-18 ENCOUNTER — Telehealth: Payer: Self-pay

## 2023-08-18 NOTE — Telephone Encounter (Signed)
 Attempted to contact patient via phone in regards to clinic appointments being cancelled earlier by the patient due to not feeling well and to ask what is exactly going on. Unable to reach, no voicemail is set up. Alternative contact person ( Mother) was also unavailable. Will try to contact patient again later on today.

## 2023-08-18 NOTE — Telephone Encounter (Signed)
Attempted to reach patient, no answer and unable to leave a voicemail.  

## 2023-08-19 ENCOUNTER — Other Ambulatory Visit (HOSPITAL_BASED_OUTPATIENT_CLINIC_OR_DEPARTMENT_OTHER): Payer: Self-pay

## 2023-08-19 ENCOUNTER — Inpatient Hospital Stay

## 2023-08-19 ENCOUNTER — Encounter: Payer: Self-pay | Admitting: Nurse Practitioner

## 2023-08-19 ENCOUNTER — Inpatient Hospital Stay (HOSPITAL_BASED_OUTPATIENT_CLINIC_OR_DEPARTMENT_OTHER): Admitting: Nurse Practitioner

## 2023-08-19 VITALS — BP 119/78 | HR 93 | Temp 97.9°F | Resp 16 | Ht 64.0 in | Wt 123.1 lb

## 2023-08-19 DIAGNOSIS — Z79630 Long term (current) use of alkylating agent: Secondary | ICD-10-CM | POA: Diagnosis not present

## 2023-08-19 DIAGNOSIS — Z79631 Long term (current) use of antimetabolite agent: Secondary | ICD-10-CM | POA: Diagnosis not present

## 2023-08-19 DIAGNOSIS — C187 Malignant neoplasm of sigmoid colon: Secondary | ICD-10-CM | POA: Diagnosis present

## 2023-08-19 DIAGNOSIS — C19 Malignant neoplasm of rectosigmoid junction: Secondary | ICD-10-CM | POA: Diagnosis not present

## 2023-08-19 DIAGNOSIS — C7962 Secondary malignant neoplasm of left ovary: Secondary | ICD-10-CM | POA: Diagnosis not present

## 2023-08-19 DIAGNOSIS — R109 Unspecified abdominal pain: Secondary | ICD-10-CM | POA: Diagnosis not present

## 2023-08-19 DIAGNOSIS — C7989 Secondary malignant neoplasm of other specified sites: Secondary | ICD-10-CM | POA: Diagnosis not present

## 2023-08-19 DIAGNOSIS — C786 Secondary malignant neoplasm of retroperitoneum and peritoneum: Secondary | ICD-10-CM | POA: Diagnosis present

## 2023-08-19 DIAGNOSIS — C787 Secondary malignant neoplasm of liver and intrahepatic bile duct: Secondary | ICD-10-CM | POA: Diagnosis present

## 2023-08-19 DIAGNOSIS — R1110 Vomiting, unspecified: Secondary | ICD-10-CM | POA: Diagnosis not present

## 2023-08-19 DIAGNOSIS — C7951 Secondary malignant neoplasm of bone: Secondary | ICD-10-CM | POA: Diagnosis not present

## 2023-08-19 LAB — CBC WITH DIFFERENTIAL (CANCER CENTER ONLY)
Abs Immature Granulocytes: 0.02 K/uL (ref 0.00–0.07)
Basophils Absolute: 0.1 K/uL (ref 0.0–0.1)
Basophils Relative: 1 %
Eosinophils Absolute: 0.3 K/uL (ref 0.0–0.5)
Eosinophils Relative: 4 %
HCT: 31.6 % — ABNORMAL LOW (ref 36.0–46.0)
Hemoglobin: 10.1 g/dL — ABNORMAL LOW (ref 12.0–15.0)
Immature Granulocytes: 0 %
Lymphocytes Relative: 35 %
Lymphs Abs: 2.8 K/uL (ref 0.7–4.0)
MCH: 27.8 pg (ref 26.0–34.0)
MCHC: 32 g/dL (ref 30.0–36.0)
MCV: 87.1 fL (ref 80.0–100.0)
Monocytes Absolute: 0.7 K/uL (ref 0.1–1.0)
Monocytes Relative: 9 %
Neutro Abs: 4.1 K/uL (ref 1.7–7.7)
Neutrophils Relative %: 51 %
Platelet Count: 334 K/uL (ref 150–400)
RBC: 3.63 MIL/uL — ABNORMAL LOW (ref 3.87–5.11)
RDW: 16.3 % — ABNORMAL HIGH (ref 11.5–15.5)
WBC Count: 8 K/uL (ref 4.0–10.5)
nRBC: 0 % (ref 0.0–0.2)

## 2023-08-19 LAB — CMP (CANCER CENTER ONLY)
ALT: 40 U/L (ref 0–44)
AST: 65 U/L — ABNORMAL HIGH (ref 15–41)
Albumin: 3.7 g/dL (ref 3.5–5.0)
Alkaline Phosphatase: 703 U/L — ABNORMAL HIGH (ref 38–126)
Anion gap: 12 (ref 5–15)
BUN: <5 mg/dL — ABNORMAL LOW (ref 6–20)
CO2: 28 mmol/L (ref 22–32)
Calcium: 9.7 mg/dL (ref 8.9–10.3)
Chloride: 98 mmol/L (ref 98–111)
Creatinine: 0.47 mg/dL (ref 0.44–1.00)
GFR, Estimated: >60 mL/min (ref >60)
Glucose, Bld: 87 mg/dL (ref 70–99)
Potassium: 3.4 mmol/L — ABNORMAL LOW (ref 3.5–5.1)
Sodium: 138 mmol/L (ref 135–145)
Total Bilirubin: 0.4 mg/dL (ref 0.0–1.2)
Total Protein: 8 g/dL (ref 6.5–8.1)

## 2023-08-19 LAB — MAGNESIUM: Magnesium: 2 mg/dL (ref 1.7–2.4)

## 2023-08-19 MED ORDER — FAMOTIDINE 20 MG PO TABS
20.0000 mg | ORAL_TABLET | ORAL | 1 refills | Status: DC
Start: 2023-08-19 — End: 2023-10-15
  Filled 2023-08-19: qty 24, 42d supply, fill #0

## 2023-08-19 NOTE — Progress Notes (Signed)
 [facility] OFFICE PROGRESS NOTE   Diagnosis: Colorectal cancer  INTERVAL HISTORY:   April [name] returns for follow-up.  She completed a cycle 1 FOLFOX 08/05/2023 when she was hospitalized.  No symptoms of an allergic reaction.  She was discharged home 08/12/2023.  She denies nausea/vomiting.  No mouth sores.  She had loose stools when in the hospital, no significant diarrhea.  She did not experience cold sensitivity.  No numbness or tingling in the hands or feet.  She feels pain overall is fairly well-controlled with a combination of the fentanyl  patch and Dilaudid  as needed.  She did not take Pepcid  and Benadryl  premedications.  Objective:  Vital signs in last 24 hours:  Blood pressure 119/78, pulse 93, temperature 97.9 F (36.6 C), temperature source Temporal, resp. rate 16, height 5' 4 (1.626 m), weight 123 lb 1.6 oz (55.8 kg), last menstrual period 01/07/2006, SpO2 100%.    HEENT: White coating over tongue. Resp: Lungs clear bilaterally. Cardio: Regular rate and rhythm. GI: Abdomen is distended, generalized tenderness.  Firm fullness right abdomen.  Marked tenderness at the umbilicus with firm nodularity. Vascular: No leg edema. Left upper extremity PICC without erythema.  Lab Results:  Lab Results  Component Value Date   WBC 8.0 08/19/2023   HGB 10.1 (L) 08/19/2023   HCT 31.6 (L) 08/19/2023   MCV 87.1 08/19/2023   PLT 334 08/19/2023   NEUTROABS 4.1 08/19/2023    Imaging:  No results found.  Medications: I have reviewed the patient's current medications.  Assessment/Plan:   Disposition: Sigmoid colon cancer, stage IV (pT4a,pN2b,M1c) Colonoscopy 03/24/2019-3 rectal polyps-hyperplastic polyps, distal colon biopsy-at least intramucosal adenocarcinoma, completely obstructing mass in the distal sigmoid colon, could not be traversed, intact mismatch repair protein expression 03/24/2019-CEA 56.1 03/29/2019 CT abdomen/pelvis-circumferential thickening  involving the entire mid and distal sigmoid colon to the level of the rectosigmoid junction, solid/cystic mass in the left ovary, bilateral nephrolithiasis Robotic assisted low anterior resection, mesenteric lymphadenectomy, bilateral salpingo-oophorectomy 04/08/2019 Pathology ([facility] review of outside pathology) metastatic adenocarcinoma involving the peritoneum overlying the round ligament, serosa of the urinary bladder, serosa of the right ureter a sacral area, pelvic peritoneum, and left ovary.  Omental biopsy with focal mucin pools with no carcinoma cells identified, right hemidiaphragm biopsy involved by metastatic adenocarcinoma, invasive adenocarcinoma the sigmoid colon, moderately differentiated, T4a, perineural and vascular invasion present, 7/22 lymph nodes, multiple tumor deposits, resection margins negative Negative for PD-L1, low probability of MSI-high, HER-2 negative, negative for BRAF, NRAS and KRAS alterations CTs 05/19/2019-no evidence of metastatic disease, findings suspicious for colitis of the transverse and ascending colon, small amount of ascites in the cul-de-sac, bilateral renal calculi Cycle 1 FOLFIRI 05/24/2019, bevacizumab  added with cycle 2 Cycle 4 FOLFIRI/bevacizumab  07/05/2019 Cycle 5 FOLFIRI 07/27/2019, bevacizumab  held secondary to hypertension Cycle 6 FOLFIRI 08/10/1999, bevacizumab  held secondary to hypertension CTs 08/30/2019-no evidence of recurrent disease, new subsolid right lower lobe nodule felt to be inflammatory, emphysema Cycle 7 FOLFIRI 09/26/2019, bevacizumab  remains on hold secondary to hypertension Cycle 8 FOLFIRI 10/17/2019, bevacizumab  held secondary to hypertension, Udenyca  added for neutropenia Cycle 9 FOLFIRI 11/07/2019, bevacizumab  held secondary to hypertension, Udenyca  Cycle 10 FOLFIRI 11/28/2019, bevacizumab  held, Udenyca  Cycle 11 FOLFIRI 12/26/2019, bevacizumab  held, Udenyca  CTs 01/25/2020-no evidence of recurrent disease, resolution of right lower lobe  nodule Maintenance Xeloda  beginning 02/06/2020 CTs 04/25/2020- no evidence of metastatic disease, multiple bilateral renal calculi without hydronephrosis Maintenance Xeloda  continued CT abdomen/pelvis without contrast 08/10/2020-bilateral staghorn renal calculi, no evidence of metastatic disease; addendum  08/23/2020-small but increasing omental nodules. Cycle 1 FOLFIRI/panitumumab  09/19/2020 Cycle 2 FOLFIRI/Panitumumab  10/03/2020 Cycle 3 FOLFIRI/Panitumumab  10/17/2020 Cycle 4 FOLFIRI/panitumumab  10/31/2020 Cycle 5 FOLFIRI/Panitumumab  11/14/2020 CTs 11/26/2020-stable omental metastases compared to 10/22/2020, mildly decreased from 08/10/2020.  Left lower quadrant tiny paracolic gutter implant slightly decreased from CT 08/10/2020.  No new or progressive metastatic disease in the abdomen or pelvis. Cycle 6 FOLFIRI/Panitumumab  11/28/2020, irinotecan  dose reduced, treatment schedule adjusted to every 3 weeks going forward Cycle 7 FOLFIRI/panitumumab  12/24/2020 Cycle 8 FOLFIRI/Panitumumab  01/16/2021--treatment held due to hypertension in the infusion area. Cycle 8 FOLFIRI/panitumumab  01/21/2021 Cycle 9 FOLFIRI/panitumumab  02/11/2021 Cycle 10 FOLFIRI/Panitumumab  03/04/2021 Cycle 11 FOLFIRI/Panitumumab  03/25/2021 CT abdomen/pelvis 04/10/2021-stable right omental and left posterior paracolic gutter nodules Cycle 12 FOLFIRI/panitumumab  04/15/2021 Cycle 13 FOLFIRI/panitumumab  05/06/2021 Cycle 14 FOLFIRI/Panitumumab  05/27/2021 Cycle 15 FOLFIRI/Panitumumab  06/17/2021 CT abdomen/pelvis 07/05/2021-slight enlargement of a dominant right omental implant, no new implants Patient requested a treatment break CT abdomen/pelvis 10/04/2021-mild increase in size of multiple omental soft tissue nodules, 4 mm calculus in the distal left ureter adjacent to the ureteral stent Cycle 1 Lonsurf  10/14/2021 10/28/2021 Avastin  every 2 weeks Cycle 2 Lonsurf  11/11/2021 Cycle 3 Lonsurf  12/09/2021 Cycle 4 Lonsurf  01/06/2022 Avastin  held 01/20/2022 due  to proteinuria, 24-hour urine 43 mg CT/pelvis 01/30/2022-possible new peritoneal implant at the transverse colon, no significant change in other omental/peritoneal implants, stable chronic rectal wall thickening, status post removal of double-J left ureteral stent with no evidence of hydronephrosis or ureteral calculus, nonobstructing bilateral renal calculi Cycle 5 Lonsurf  02/05/2022 or 02/06/2022 Avastin  every 2 weeks Cycle 6 Lonsurf  03/03/2022 CTs 04/05/2022-increase in size and number of peritoneal implants compared to January 2024 central liver mass narrowing the anterior branch of the right portal vein, decreased biliary duct dilation, biliary stents in place, stable right cardiophrenic lymph nodes, separate pigtail stent in the lumen of the proximal duodenum Cycle 1 FOLFOX 04/14/2022 CT abdomen/pelvis 04/16/2022-stent within the third portion of the duodenum, stable peritoneal nodules, indwelling biliary stents with mild left biliary duct dilation Cycle 2 FOLFOX 04/28/2022, Emend and prophylactic dexamethasone  added Cycle 3 FOLFOX 05/12/2022, Aloxi , Emend, prophylactic dexamethasone , Compazine  and lorazepam  as needed Chemotherapy held 05/25/2020 due to severe hypertension, evaluated in the emergency department Cycle 4 FOLFOX 06/03/2022 Cycle 5 FOLFOX 06/16/2022, oxaliplatin  dose reduced and 5-FU bolus eliminated due to neutropenia CT 06/25/2022-unchanged soft tissue at the hepatic hilum, unchanged, bile duct stent, decreased size of peritoneal and omental nodules and diminished volume of likely fluid in the lower left pelvis Cycle 6 FOLFOX 06/30/2022 Cycle 7 FOLFOX 07/21/2022 CT 07/24/2022 (in the emergency department with nausea, headache, abdominal pain)-no acute pathology.  No significant interval change in omental nodularity. Cycle 8 FOLFOX 08/04/2022, pruritus on the palms at the end of the oxaliplatin  infusion, Pepcid  and Benadryl  given, symptoms resolved, infusion not resumed Cycle 9 FOLFOX  08/18/2022-Pepcid  and Benadryl  added to premedications, Oxaliplatin  diluted in a larger volume and infusion time increased Cycle 10 FOLFOX 09/08/2022 Cycle 11 FOLFOX 09/22/2022 10/06/2022 patient declined treatment CTs 10/15/2022-similar soft tissue fullness in the hepatic hilum with biliary stents in place, decrease in omental/peritoneal metastases Maintenance capecitabine  10/26/2022 CT abdomen/pelvis 11/19/2022-mild increase in tumor studding at the left paracolic gutter and omentum, circumferential wall thickening of the rectum, biliary stents in place with minimal increase in intrahepatic biliary dilation, nonobstructive renal calculi Colonoscopy 11/23/2022-anastomosis at 15 cm from the anal verge-patent, 2 cm polypoid tissue at the anastomosis biopsied, invasive adenocarcinoma moderately differentiated, preserved expression of major MMR proteins CTs 12/15/2022: Possible developing nodule in the lateral left liver, increased  omental nodularity, persistent wall thickening in the upper rectum CT abdomen/pelvis 12/24/2022: Increased thickened fold in the transverse colon-infectious/inflammatory, 2.1 cm lateral left liver lesion, potential new 5 mm segment 5 lesion, stable biliary stents with intrahepatic biliary dilation, central biliary wall thickening near the hepatic hilum, multiple peritoneal/omental implants unchanged from 12/15/2022, but likely larger than 11/19/2022 Cycle 1 irinotecan /Panitumumab  01/06/2023 Cycle 2 irinotecan /Panitumumab  01/20/2023 CTs 01/24/2023-slight increase in omental nodularity. Cycle 3 irinotecan /Panitumumab  02/03/2023 Cycle 4 irinotecan /panitumumab  02/17/2023 CTs abdomen/pelvis 02/24/2023-subtly hypoattenuating lesion within the peripheral aspect of hepatic segment 2 redemonstrated.  Additional scattered subcentimeter low-density lesions within the liver too small to characterize.  Prominent intrahepatic biliary dilatation with biliary stents in place.  Similar degree of nodularity  along the omentum and left paracolic gutter.  No ascites. CTs 03/09/2023-indwelling endoscopically placed stents; level of intrahepatic biliary duct dilatation is slightly improved on the left and some on the right.  Gallbladder is dilated with wall edema which is increasing.  More nodular tissue extending up along the porta hepatis.  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  Rectum with nodular wall thickening with extension into the adjacent tissue, nodular areas seen extending perirectal and presacral spaces.  Peritoneal carcinomatosis again identified. Irinotecan /Panitumumab  04/02/2023 Irinotecan /panitumumab  04/16/2023 Irinotecan /Panitumumab  05/07/2023 Irinotecan /Panitumumab  06/19/2023 CT abdomen/pelvis 06/21/2023-stable heterogeneous enhancement throughout the liver with nodularity along the surface of the liver.  Small amount of perihepatic ascites.  Stable nodularity right abdomen.  Circumferential rectal wall thickening/mass unchanged. Irinotecan /Panitumumab  discontinued 07/10/2023 due to lack of clinical benefit/poor tolerance Appointment with Dr. Sheilda 07/23/2023 CTs 08/02/2023-mid rectal soft tissue mass, mildly progressive.  Right hepatic metastases, progressive.  Peritoneal disease/omental caking stable versus mildly progressive.  Small volume abdominopelvic ascites mildly progressive. Cycle 1 FOLFOX 08/05/2023 Cycle 2 FOLFOX 08/20/2023 (Pepcid  and Benadryl  daily x 2 days before treatment, Pepcid  continued for 2 days post oxaliplatin ; oxaliplatin  administration time increased)   Hypertension G4 P3, twins Kidney stones-bilateral staghorn renal calculi on CT 08/10/2020 Infected Port-A-Cath 09/12/2019-placed on Augmentin , referred for Port-A-Cath removal; Port-A-Cath removed 09/14/2019; PICC line placed 09/14/2019; culture staph aureus.  Course of Septra  completed. Neutropenia secondary to chemotherapy-Udenyca  added with cycle 8 FOLFIRI Right nephrostomy tube 09/12/2020; stent placed 10/09/2020,  percutaneous nephrostolithotomy treatment of right-sided kidney stones 10/09/2020, malpositioned right ureter stent replacement 10/23/2020, right ureter stent removed 10/29/2020 Percutaneous left nephrostolithotomy 10/01/2021-no renal stone identified, left ureter stent left in place Cystoscopy 10/14/2021-stent removed 8.  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  2 moderate biliary strictures found in the hepatic duct system, malignant appearing.  Left and right hepatic duct and all intrahepatic branches were moderately dilated secondary to the strictures.  The strictures were dilated.  Plastic stent placed into the left hepatic duct and right hepatic duct. 9.  Admission 04/16/2022 with nausea/vomiting and abdominal pain, felt to be acute toxicity related to chemotherapy, discharged home 04/18/2022 10.  Admission 11/19/2022 with nausea/vomiting and abdominal pain-improved 11.  Admission 12/15/2022 with fever and right submandibular swelling/tenderness CT neck 12/15/2022: 2 mm distal right submandibular duct calculus with mild inflammatory  change adjacent to the right submandibular gland 12.  Admission 12/24/2022 with a fever nausea/vomiting, diarrhea, Klebsiella bacteremia completed course of Augmentin  13.  Admission with abdominal pain and nausea/vomiting 03/09/2023 through 03/14/2023-concern for gallbladder inflammation.  Not  felt to be a surgical candidate.  Percutaneous cholecystostomy tube placed 03/11/2023.  Tube removed 04/24/2023. 14.  Admission 06/20/2023 with abdominal pain and constipation, pain improved partially following a bowel movement  Disposition: April [name] appears stable.  She tolerated the first cycle of retreatment with FOLFOX well, no signs of an allergic reaction.  She did not take benadryl  and Pepcid  premedications prior to today's treatment.  We are holding cycle 2 and rescheduling for 08/20/2023.  She will take Benadryl  and Pepcid  at home tonight.  CBC and chemistry panel reviewed.  Labs adequate for treatment.  She has pain/tenderness at the umbilicus.  Exam shows firm thickening/nodularity.  We reviewed CT images from 08/10/2023 with her.  Plan to continue to monitor.  She will return for follow-up and the next cycle of chemotherapy in 2 weeks.  We are available to see her sooner if needed.  Patient seen with Dr. Cloretta.     Olam Ned ANP/GNP-BC   08/19/2023  10:31 AM  This was a shared visit with Olam Ned.  April [name] was interviewed and examined.  She tolerated the first cycle of repeat FOLFOX without symptoms of an allergic reaction.  She will return for cycle 2 tomorrow.  She will receive antihistamine premedication.  Her pain appears to be adequately managed with the current narcotic regimen.  We reviewed the most recent CT images with her.  We will plan for a restaging CT evaluation after 4-5 cycles of FOLFOX.  I was present for greater than 50% of today's visit.  I performed Medical Decision Making.  Arvella Cloretta, MD

## 2023-08-19 NOTE — Progress Notes (Signed)
 Patient seen by Olam Ned NP today  Vitals are within treatment parameters:Yes  Increased 2 pounds Labs are within treatment parameters: Yes  Alkaline Phosphatase- 703,  Treatment plan has been signed: Yes   Per physician team, Patient will not be receiving treatment today.

## 2023-08-19 NOTE — Patient Instructions (Signed)
 PICC Home Care Guide A peripherally inserted central catheter (PICC) is a form of IV access that allows medicines and IV fluids to be quickly put into the blood and spread throughout the body. The PICC is a long, thin, flexible tube (catheter) that is put into a vein in a person's arm or leg. The catheter ends in a large vein just outside the heart called the superior vena cava (SVC). After the PICC is put in, a chest X-ray may be done to make sure that it is in the right place. A PICC may be placed for different reasons, such as: To give medicines and liquid nutrition. To give IV fluids and blood products. To take blood samples often. If there is trouble placing a peripheral intravenous (PIV) catheter. If cared for properly, a PICC can remain in place for many months. Having a PICC can allow you to go home from the hospital sooner and continue treatment at home. Medicines and PICC care can be managed at home by a family member, caregiver, or home health care team. What are the risks? Generally, having a PICC is safe. However, problems may occur, including: A blood clot (thrombus) forming in or at the end of the PICC. A blood clot forming in a vein (deep vein thrombosis) or traveling to the lung (pulmonary embolism). Inflammation of the vein (phlebitis) in which the PICC is placed. Infection at the insertion site or in the blood. Blood infections from central lines, like PICCs, can be serious and often require a hospital stay. PICC malposition, or PICC movement or poor placement. A break or cut in the PICC. Do not use scissors near the PICC. Nerve or tendon irritation or injury during PICC insertion. How to care for your PICC Please follow the specific guidelines provided by your health care provider. Preventing infection You and any caregivers should wash your hands often with soap and water for at least 20 seconds. Wash hands: Before touching the PICC or the infusion device. Before changing a  bandage (dressing). Do not change the dressing unless you have been taught to do so and have shown you are able to change it safely. Flush the PICC as told. Tell your health care provider right away if the PICC is hard to flush or does not flush. Do not use force to flush the PICC. Use clean and germ-free (sterile) supplies only. Keep the supplies in a dry place. Do not reuse needles, syringes, or any other supplies. Reusing supplies can lead to infection. Keep the PICC dressing dry and secure it with tape if the edges stop sticking to your skin. Check your PICC insertion site every day for signs of infection. Check for: Redness, swelling, or pain. Fluid or blood. Warmth. Pus or a bad smell. Preventing other problems Do not use a syringe that is less than 10 mL to flush the PICC. Do not have your blood pressure checked on the arm in which the PICC is placed. Do not ever pull or tug on the PICC. Keep it secured to your arm with tape or a stretch wrap when not in use. Do not take the PICC out yourself. Only a trained health care provider should remove the PICC. Keep pets and children away from your PICC. How to care for your PICC dressing Keep your PICC dressing clean and dry to prevent infection. Do not take baths, swim, or use a hot tub until your health care provider approves. Ask your health care provider if you can take  showers. You may only be allowed to take sponge baths. When you are allowed to shower: Ask your health care provider to teach you how to wrap the PICC. Cover the PICC with clear plastic wrap and tape to keep it dry while showering. Follow instructions from your health care provider about how to take care of your insertion site and dressing. Make sure you: Wash your hands with soap and water for at least 20 seconds before and after you change your dressing. If soap and water are not available, use hand sanitizer. Change your dressing only if taught to do so by your health care  provider. Your PICC dressing needs to be changed if it becomes loose or wet. Leave stitches (sutures), skin glue, or adhesive strips in place. These skin closures may need to stay in place for 2 weeks or longer. If adhesive strip edges start to loosen and curl up, you may trim the loose edges. Do not remove adhesive strips completely unless your health care provider tells you to do that. Follow these instructions at home: Disposal of supplies Throw away any syringes in a disposal container that is meant for sharp items (sharps container). You can buy a sharps container from a pharmacy, or you can make one by using an empty, hard plastic bottle with a lid. Place any used dressings or infusion bags into a plastic bag. Throw that bag in the trash. General instructions  Always carry your PICC identification card or wear a medical alert bracelet. Keep the tube clamped at all times, unless it is being used. Always carry a smooth-edge clamp with you to clamp the PICC if it breaks. Do not use scissors or sharp objects near the tube. You may bend your arm and move it freely. If your PICC is near or at the bend of your elbow, avoid activity with repeated motion at the elbow. Avoid lifting heavy objects as told by your health care provider. Keep all follow-up visits. This is important. You will need to have your PICC dressing changed at least once a week. Contact a health care provider if: You have pain in your arm, ear, face, or teeth. You have a fever or chills. You have redness, swelling, or pain around the insertion site. You have fluid or blood coming from the insertion site. Your insertion site feels warm to the touch. You have pus or a bad smell coming from the insertion site. Your skin feels hard and raised around the insertion site. Your PICC dressing has gotten wet or is coming off and you have not been taught how to change it. Get help right away if: You have problems with your PICC, such as  your PICC: Was tugged or pulled and has partially come out. Do not  push the PICC back in. Cannot be flushed, is hard to flush, or leaks around the insertion site when it is flushed. Makes a flushing sound when it is flushed. Appears to have a hole or tear. Is accidentally pulled all the way out. If this happens, cover the insertion site with a gauze dressing. Do not throw the PICC away. Your health care provider will need to check it to be sure the entire catheter came out. You feel your heart racing or skipping beats, or you have chest pain. You have shortness of breath or trouble breathing. You have swelling, redness, warmth, or pain in the arm in which the PICC is placed. You have a red streak going up your arm that  starts under the PICC dressing. These symptoms may be an emergency. Get help right away. Call 911. Do not wait to see if the symptoms will go away. Do not drive yourself to the hospital. Summary A peripherally inserted central catheter (PICC) is a long, thin, flexible tube (catheter) that is put into a vein in the arm or leg. If cared for properly, a PICC can remain in place for many months. Having a PICC can allow you to go home from the hospital sooner and continue treatment at home. The PICC is inserted using a germ-free (sterile) technique by a specially trained health care provider. Only a trained health care provider should remove it. Do not have your blood pressure checked on the arm in which your PICC is placed. Always keep your PICC identification card with you. This information is not intended to replace advice given to you by your health care provider. Make sure you discuss any questions you have with your health care provider. Document Revised: 07/18/2020 Document Reviewed: 07/18/2020 Elsevier Patient Education  2024 [employer].

## 2023-08-20 ENCOUNTER — Inpatient Hospital Stay

## 2023-08-21 ENCOUNTER — Telehealth: Payer: Self-pay | Admitting: Oncology

## 2023-08-21 ENCOUNTER — Inpatient Hospital Stay

## 2023-08-21 NOTE — Telephone Encounter (Signed)
 Called PT to confirm appt details for 8/13, Wednesday. PT aware.

## 2023-08-22 ENCOUNTER — Encounter

## 2023-08-25 ENCOUNTER — Other Ambulatory Visit (HOSPITAL_COMMUNITY): Payer: Self-pay

## 2023-08-25 ENCOUNTER — Other Ambulatory Visit: Payer: Self-pay

## 2023-08-25 MED ORDER — HYDROMORPHONE HCL 8 MG PO TABS
8.0000 mg | ORAL_TABLET | ORAL | 0 refills | Status: DC | PRN
  Filled 2023-08-25: qty 100, 16d supply, fill #0
  Filled 2023-08-26: qty 100, 17d supply, fill #0

## 2023-08-26 ENCOUNTER — Encounter: Payer: Self-pay | Admitting: Oncology

## 2023-08-26 ENCOUNTER — Inpatient Hospital Stay

## 2023-08-26 ENCOUNTER — Other Ambulatory Visit: Payer: Self-pay | Admitting: Nurse Practitioner

## 2023-08-26 ENCOUNTER — Other Ambulatory Visit (HOSPITAL_COMMUNITY): Payer: Self-pay

## 2023-08-26 ENCOUNTER — Ambulatory Visit

## 2023-08-26 ENCOUNTER — Encounter: Payer: Self-pay | Admitting: Nurse Practitioner

## 2023-08-26 VITALS — BP 142/96 | HR 88 | Temp 97.7°F | Resp 18

## 2023-08-26 DIAGNOSIS — C19 Malignant neoplasm of rectosigmoid junction: Secondary | ICD-10-CM

## 2023-08-26 DIAGNOSIS — G893 Neoplasm related pain (acute) (chronic): Secondary | ICD-10-CM

## 2023-08-26 DIAGNOSIS — E876 Hypokalemia: Secondary | ICD-10-CM

## 2023-08-26 DIAGNOSIS — C187 Malignant neoplasm of sigmoid colon: Secondary | ICD-10-CM | POA: Diagnosis not present

## 2023-08-26 LAB — CMP (CANCER CENTER ONLY)
ALT: 29 U/L (ref 0–44)
AST: 59 U/L — ABNORMAL HIGH (ref 15–41)
Albumin: 3.6 g/dL (ref 3.5–5.0)
Alkaline Phosphatase: 815 U/L — ABNORMAL HIGH (ref 38–126)
Anion gap: 14 (ref 5–15)
BUN: 5 mg/dL — ABNORMAL LOW (ref 6–20)
CO2: 29 mmol/L (ref 22–32)
Calcium: 9.9 mg/dL (ref 8.9–10.3)
Chloride: 94 mmol/L — ABNORMAL LOW (ref 98–111)
Creatinine: 0.52 mg/dL (ref 0.44–1.00)
GFR, Estimated: >60 mL/min (ref >60)
Glucose, Bld: 117 mg/dL — ABNORMAL HIGH (ref 70–99)
Potassium: 2.9 mmol/L — ABNORMAL LOW (ref 3.5–5.1)
Sodium: 136 mmol/L (ref 135–145)
Total Bilirubin: 0.3 mg/dL (ref 0.0–1.2)
Total Protein: 8.4 g/dL — ABNORMAL HIGH (ref 6.5–8.1)

## 2023-08-26 LAB — CBC WITH DIFFERENTIAL (CANCER CENTER ONLY)
Abs Immature Granulocytes: 0.02 K/uL (ref 0.00–0.07)
Basophils Absolute: 0.1 K/uL (ref 0.0–0.1)
Basophils Relative: 1 %
Eosinophils Absolute: 0.3 K/uL (ref 0.0–0.5)
Eosinophils Relative: 3 %
HCT: 33.6 % — ABNORMAL LOW (ref 36.0–46.0)
Hemoglobin: 10.8 g/dL — ABNORMAL LOW (ref 12.0–15.0)
Immature Granulocytes: 0 %
Lymphocytes Relative: 30 %
Lymphs Abs: 2.6 K/uL (ref 0.7–4.0)
MCH: 27.3 pg (ref 26.0–34.0)
MCHC: 32.1 g/dL (ref 30.0–36.0)
MCV: 85.1 fL (ref 80.0–100.0)
Monocytes Absolute: 0.6 K/uL (ref 0.1–1.0)
Monocytes Relative: 7 %
Neutro Abs: 4.9 K/uL (ref 1.7–7.7)
Neutrophils Relative %: 59 %
Platelet Count: 384 K/uL (ref 150–400)
RBC: 3.95 MIL/uL (ref 3.87–5.11)
RDW: 16.3 % — ABNORMAL HIGH (ref 11.5–15.5)
WBC Count: 8.5 K/uL (ref 4.0–10.5)
nRBC: 0 % (ref 0.0–0.2)

## 2023-08-26 LAB — MAGNESIUM: Magnesium: 1.9 mg/dL (ref 1.7–2.4)

## 2023-08-26 MED ORDER — DEXAMETHASONE SODIUM PHOSPHATE 10 MG/ML IJ SOLN
10.0000 mg | Freq: Once | INTRAMUSCULAR | Status: AC
  Administered 2023-08-26 (×2): 10 mg via INTRAVENOUS
  Filled 2023-08-26: qty 1

## 2023-08-26 MED ORDER — DEXTROSE 5 % IV SOLN
INTRAVENOUS | Status: DC
Start: 2023-08-26 — End: 2023-08-26

## 2023-08-26 MED ORDER — DIPHENHYDRAMINE HCL 50 MG/ML IJ SOLN
25.0000 mg | Freq: Once | INTRAMUSCULAR | Status: AC
  Administered 2023-08-26 (×2): 25 mg via INTRAVENOUS
  Filled 2023-08-26: qty 1

## 2023-08-26 MED ORDER — PALONOSETRON HCL INJECTION 0.25 MG/5ML
0.2500 mg | Freq: Once | INTRAVENOUS | Status: AC
  Administered 2023-08-26 (×2): 0.25 mg via INTRAVENOUS
  Filled 2023-08-26: qty 5

## 2023-08-26 MED ORDER — LEUCOVORIN CALCIUM INJECTION 350 MG
300.0000 mg/m2 | Freq: Once | INTRAVENOUS | Status: AC
  Administered 2023-08-26 (×2): 480 mg via INTRAVENOUS
  Filled 2023-08-26: qty 24

## 2023-08-26 MED ORDER — FAMOTIDINE IN NACL 20-0.9 MG/50ML-% IV SOLN
20.0000 mg | Freq: Once | INTRAVENOUS | Status: AC
  Administered 2023-08-26 (×2): 20 mg via INTRAVENOUS
  Filled 2023-08-26: qty 50

## 2023-08-26 MED ORDER — ALTEPLASE 2 MG IJ SOLR
2.0000 mg | Freq: Once | INTRAMUSCULAR | Status: AC | PRN
  Administered 2023-08-26 (×2): 2 mg
  Filled 2023-08-26: qty 2

## 2023-08-26 MED ORDER — FLUOROURACIL CHEMO INJECTION 500 MG/10ML
300.0000 mg/m2 | Freq: Once | INTRAVENOUS | Status: AC
  Administered 2023-08-26 (×2): 500 mg via INTRAVENOUS
  Filled 2023-08-26: qty 10

## 2023-08-26 MED ORDER — SODIUM CHLORIDE 0.9 % IV SOLN
150.0000 mg | Freq: Once | INTRAVENOUS | Status: AC
  Administered 2023-08-26 (×2): 150 mg via INTRAVENOUS
  Filled 2023-08-26: qty 150

## 2023-08-26 MED ORDER — OXALIPLATIN CHEMO INJECTION 100 MG/20ML
65.0000 mg/m2 | Freq: Once | INTRAVENOUS | Status: AC
  Administered 2023-08-26 (×2): 100 mg via INTRAVENOUS
  Filled 2023-08-26: qty 20

## 2023-08-26 MED ORDER — OXYCODONE HCL 5 MG PO TABS
10.0000 mg | ORAL_TABLET | Freq: Once | ORAL | Status: AC
  Administered 2023-08-26 (×2): 10 mg via ORAL
  Filled 2023-08-26: qty 2

## 2023-08-26 MED ORDER — POTASSIUM CHLORIDE CRYS ER 20 MEQ PO TBCR
40.0000 meq | EXTENDED_RELEASE_TABLET | Freq: Once | ORAL | Status: AC
  Administered 2023-08-26 (×2): 40 meq via ORAL
  Filled 2023-08-26: qty 2

## 2023-08-26 MED ORDER — SODIUM CHLORIDE 0.9 % IV SOLN
1800.0000 mg/m2 | INTRAVENOUS | Status: DC
  Administered 2023-08-26 (×2): 2900 mg via INTRAVENOUS
  Filled 2023-08-26: qty 58

## 2023-08-26 NOTE — Progress Notes (Signed)
 Pt here for PICC line flush and labs. No blood return from purple lumen despite three flushes. Red lumen had very scant blood return initially but then none despite multiple flushes (and position changes) by myself and Curtis Ditch, RN. Pt reports the purple lumen has not had any blood return recently. See MAR- Curtis Ditch, RN administered Cathflo to red lumen. Phlebotomist in to do a peripheral stick for labs. See Kellie's infusion note for further information.  Dressing change is overdue- will be completed by infusion nurse today.

## 2023-08-26 NOTE — Progress Notes (Signed)
 Called to infusion to assess patient due to elevated BP. Seen last week but rescheduled chemo to today. Took allergy premeds pepcid /benadryl  8/11 and 8/12 as prescribed. Feels OK today except nauseous and in pain. Not taking K consistently. When she does, takes 1 or 2 tabs. Denies fever/chills, constipation/diarrhea, cough, chest pain, dyspnea or other new concerns.   Labs reviewed. K 2.9 will give 40 mEq po today during infusion and she will restart 40 mEq daily at home. BP at baseline. Will give pain med. OK to treat. Knows to take pepcid  at home x2 days.  Will move future appts accordingly. She had no other needs at this time.   April Chamberlain, NP

## 2023-08-26 NOTE — Patient Instructions (Signed)
 [facility] - A DEPT OF [facility]  Discharge Instructions: Thank you for choosing [facility] to provide your oncology and hematology care.   If you have a lab appointment with the Cancer Center, please go directly to the Cancer Center and check in at the registration area.   Wear comfortable clothing and clothing appropriate for easy access to any Portacath or PICC line.   We strive to give you quality time with your provider. You may need to reschedule your appointment if you arrive late (15 or more minutes).  Arriving late affects you and other patients whose appointments are after yours.  Also, if you miss three or more appointments without notifying the office, you may be dismissed from the clinic at the provider's discretion.      For prescription refill requests, have your pharmacy contact our office and allow 72 hours for refills to be completed.    Today you received the following chemotherapy and/or immunotherapy agents oxaliplatin , leucovorin , flurourocil.      To help prevent nausea and vomiting after your treatment, we encourage you to take your nausea medication as directed.  BELOW ARE SYMPTOMS THAT SHOULD BE REPORTED IMMEDIATELY: *FEVER GREATER THAN 100.4 F (38 C) OR HIGHER *CHILLS OR SWEATING *NAUSEA AND VOMITING THAT IS NOT CONTROLLED WITH YOUR NAUSEA MEDICATION *UNUSUAL SHORTNESS OF BREATH *UNUSUAL BRUISING OR BLEEDING *URINARY PROBLEMS (pain or burning when urinating, or frequent urination) *BOWEL PROBLEMS (unusual diarrhea, constipation, pain near the anus) TENDERNESS IN MOUTH AND THROAT WITH OR WITHOUT PRESENCE OF ULCERS (sore throat, sores in mouth, or a toothache) UNUSUAL RASH, SWELLING OR PAIN  UNUSUAL VAGINAL DISCHARGE OR ITCHING   Items with * indicate a potential emergency and should be followed up as soon as possible or go to the Emergency Department if any problems should occur.  Please show the CHEMOTHERAPY  ALERT CARD or IMMUNOTHERAPY ALERT CARD at check-in to the Emergency Department and triage nurse.  Should you have questions after your visit or need to cancel or reschedule your appointment, please contact [facility] - A DEPT OF [facility]  Dept: [phone]  and follow the prompts.  Office hours are 8:00 a.m. to 4:30 p.m. Monday - Friday. Please note that voicemails left after 4:00 p.m. may not be returned until the following business day.  We are closed weekends and major holidays. You have access to a nurse at all times for urgent questions. Please call the main number to the clinic Dept: [phone] and follow the prompts.   For any non-urgent questions, you may also contact your provider using MyChart. We now offer e-Visits for anyone 68 and older to request care online for non-urgent symptoms. For details visit [email or web address].   Also download the MyChart app! Go to the app store, search MyChart, open the app, select [place], and log in with your MyChart username and password.

## 2023-08-27 ENCOUNTER — Other Ambulatory Visit (HOSPITAL_COMMUNITY): Payer: Self-pay

## 2023-08-28 ENCOUNTER — Inpatient Hospital Stay

## 2023-08-28 VITALS — BP 140/88 | HR 112 | Temp 97.6°F | Resp 18

## 2023-08-28 DIAGNOSIS — C19 Malignant neoplasm of rectosigmoid junction: Secondary | ICD-10-CM

## 2023-08-28 NOTE — Patient Instructions (Signed)
 PICC Home Care Guide A peripherally inserted central catheter (PICC) is a form of IV access that allows medicines and IV fluids to be quickly put into the blood and spread throughout the body. The PICC is a long, thin, flexible tube (catheter) that is put into a vein in a person's arm or leg. The catheter ends in a large vein just outside the heart called the superior vena cava (SVC). After the PICC is put in, a chest X-ray may be done to make sure that it is in the right place. A PICC may be placed for different reasons, such as: To give medicines and liquid nutrition. To give IV fluids and blood products. To take blood samples often. If there is trouble placing a peripheral intravenous (PIV) catheter. If cared for properly, a PICC can remain in place for many months. Having a PICC can allow you to go home from the hospital sooner and continue treatment at home. Medicines and PICC care can be managed at home by a family member, caregiver, or home health care team. What are the risks? Generally, having a PICC is safe. However, problems may occur, including: A blood clot (thrombus) forming in or at the end of the PICC. A blood clot forming in a vein (deep vein thrombosis) or traveling to the lung (pulmonary embolism). Inflammation of the vein (phlebitis) in which the PICC is placed. Infection at the insertion site or in the blood. Blood infections from central lines, like PICCs, can be serious and often require a hospital stay. PICC malposition, or PICC movement or poor placement. A break or cut in the PICC. Do not use scissors near the PICC. Nerve or tendon irritation or injury during PICC insertion. How to care for your PICC Please follow the specific guidelines provided by your health care provider. Preventing infection You and any caregivers should wash your hands often with soap and water for at least 20 seconds. Wash hands: Before touching the PICC or the infusion device. Before changing a  bandage (dressing). Do not change the dressing unless you have been taught to do so and have shown you are able to change it safely. Flush the PICC as told. Tell your health care provider right away if the PICC is hard to flush or does not flush. Do not use force to flush the PICC. Use clean and germ-free (sterile) supplies only. Keep the supplies in a dry place. Do not reuse needles, syringes, or any other supplies. Reusing supplies can lead to infection. Keep the PICC dressing dry and secure it with tape if the edges stop sticking to your skin. Check your PICC insertion site every day for signs of infection. Check for: Redness, swelling, or pain. Fluid or blood. Warmth. Pus or a bad smell. Preventing other problems Do not use a syringe that is less than 10 mL to flush the PICC. Do not have your blood pressure checked on the arm in which the PICC is placed. Do not ever pull or tug on the PICC. Keep it secured to your arm with tape or a stretch wrap when not in use. Do not take the PICC out yourself. Only a trained health care provider should remove the PICC. Keep pets and children away from your PICC. How to care for your PICC dressing Keep your PICC dressing clean and dry to prevent infection. Do not take baths, swim, or use a hot tub until your health care provider approves. Ask your health care provider if you can take  showers. You may only be allowed to take sponge baths. When you are allowed to shower: Ask your health care provider to teach you how to wrap the PICC. Cover the PICC with clear plastic wrap and tape to keep it dry while showering. Follow instructions from your health care provider about how to take care of your insertion site and dressing. Make sure you: Wash your hands with soap and water for at least 20 seconds before and after you change your dressing. If soap and water are not available, use hand sanitizer. Change your dressing only if taught to do so by your health care  provider. Your PICC dressing needs to be changed if it becomes loose or wet. Leave stitches (sutures), skin glue, or adhesive strips in place. These skin closures may need to stay in place for 2 weeks or longer. If adhesive strip edges start to loosen and curl up, you may trim the loose edges. Do not remove adhesive strips completely unless your health care provider tells you to do that. Follow these instructions at home: Disposal of supplies Throw away any syringes in a disposal container that is meant for sharp items (sharps container). You can buy a sharps container from a pharmacy, or you can make one by using an empty, hard plastic bottle with a lid. Place any used dressings or infusion bags into a plastic bag. Throw that bag in the trash. General instructions  Always carry your PICC identification card or wear a medical alert bracelet. Keep the tube clamped at all times, unless it is being used. Always carry a smooth-edge clamp with you to clamp the PICC if it breaks. Do not use scissors or sharp objects near the tube. You may bend your arm and move it freely. If your PICC is near or at the bend of your elbow, avoid activity with repeated motion at the elbow. Avoid lifting heavy objects as told by your health care provider. Keep all follow-up visits. This is important. You will need to have your PICC dressing changed at least once a week. Contact a health care provider if: You have pain in your arm, ear, face, or teeth. You have a fever or chills. You have redness, swelling, or pain around the insertion site. You have fluid or blood coming from the insertion site. Your insertion site feels warm to the touch. You have pus or a bad smell coming from the insertion site. Your skin feels hard and raised around the insertion site. Your PICC dressing has gotten wet or is coming off and you have not been taught how to change it. Get help right away if: You have problems with your PICC, such as  your PICC: Was tugged or pulled and has partially come out. Do not  push the PICC back in. Cannot be flushed, is hard to flush, or leaks around the insertion site when it is flushed. Makes a flushing sound when it is flushed. Appears to have a hole or tear. Is accidentally pulled all the way out. If this happens, cover the insertion site with a gauze dressing. Do not throw the PICC away. Your health care provider will need to check it to be sure the entire catheter came out. You feel your heart racing or skipping beats, or you have chest pain. You have shortness of breath or trouble breathing. You have swelling, redness, warmth, or pain in the arm in which the PICC is placed. You have a red streak going up your arm that  starts under the PICC dressing. These symptoms may be an emergency. Get help right away. Call 911. Do not wait to see if the symptoms will go away. Do not drive yourself to the hospital. Summary A peripherally inserted central catheter (PICC) is a long, thin, flexible tube (catheter) that is put into a vein in the arm or leg. If cared for properly, a PICC can remain in place for many months. Having a PICC can allow you to go home from the hospital sooner and continue treatment at home. The PICC is inserted using a germ-free (sterile) technique by a specially trained health care provider. Only a trained health care provider should remove it. Do not have your blood pressure checked on the arm in which your PICC is placed. Always keep your PICC identification card with you. This information is not intended to replace advice given to you by your health care provider. Make sure you discuss any questions you have with your health care provider. Document Revised: 07/18/2020 Document Reviewed: 07/18/2020 Elsevier Patient Education  2024 [employer].

## 2023-08-31 ENCOUNTER — Inpatient Hospital Stay: Admitting: Nurse Practitioner

## 2023-09-02 ENCOUNTER — Other Ambulatory Visit

## 2023-09-02 ENCOUNTER — Ambulatory Visit: Admitting: Nurse Practitioner

## 2023-09-03 ENCOUNTER — Ambulatory Visit: Admitting: Nurse Practitioner

## 2023-09-03 ENCOUNTER — Other Ambulatory Visit

## 2023-09-03 ENCOUNTER — Ambulatory Visit

## 2023-09-05 ENCOUNTER — Encounter

## 2023-09-06 ENCOUNTER — Other Ambulatory Visit: Payer: Self-pay | Admitting: Oncology

## 2023-09-06 DIAGNOSIS — C19 Malignant neoplasm of rectosigmoid junction: Secondary | ICD-10-CM

## 2023-09-07 ENCOUNTER — Other Ambulatory Visit: Payer: Self-pay

## 2023-09-07 ENCOUNTER — Telehealth: Payer: Self-pay | Admitting: Nurse Practitioner

## 2023-09-07 ENCOUNTER — Other Ambulatory Visit (HOSPITAL_COMMUNITY): Payer: Self-pay

## 2023-09-07 ENCOUNTER — Other Ambulatory Visit

## 2023-09-07 ENCOUNTER — Inpatient Hospital Stay: Admitting: Nurse Practitioner

## 2023-09-07 ENCOUNTER — Encounter: Payer: Self-pay | Admitting: Oncology

## 2023-09-07 MED ORDER — FENTANYL 50 MCG/HR TD PT72
1.0000 | MEDICATED_PATCH | TRANSDERMAL | 0 refills | Status: DC
  Filled 2023-09-07 – 2023-09-09 (×2): qty 10, 30d supply, fill #0
  Filled ????-??-??: fill #0

## 2023-09-07 MED ORDER — HYDROMORPHONE HCL 8 MG PO TABS
8.0000 mg | ORAL_TABLET | ORAL | 0 refills | Status: DC | PRN
  Filled 2023-09-07: qty 100, 17d supply, fill #0
  Filled 2023-09-08: qty 100, 13d supply, fill #0

## 2023-09-07 NOTE — Telephone Encounter (Signed)
 Pt called for medication refill and to r/s missed palliative appt. See associated orders, call transferred to scheduling dept.

## 2023-09-07 NOTE — Telephone Encounter (Signed)
 Scheduled appointment per staff message. Talked with the patient and she is aware of the made appointment.

## 2023-09-08 ENCOUNTER — Encounter: Payer: Self-pay | Admitting: Oncology

## 2023-09-08 ENCOUNTER — Inpatient Hospital Stay

## 2023-09-08 ENCOUNTER — Inpatient Hospital Stay (HOSPITAL_BASED_OUTPATIENT_CLINIC_OR_DEPARTMENT_OTHER): Admitting: Nurse Practitioner

## 2023-09-08 ENCOUNTER — Encounter: Payer: Self-pay | Admitting: Nurse Practitioner

## 2023-09-08 ENCOUNTER — Telehealth (HOSPITAL_COMMUNITY): Payer: Self-pay

## 2023-09-08 ENCOUNTER — Other Ambulatory Visit (HOSPITAL_COMMUNITY): Payer: Self-pay

## 2023-09-08 VITALS — BP 191/139 | HR 100 | Temp 97.3°F | Resp 19 | Wt 121.2 lb

## 2023-09-08 VITALS — BP 145/82 | HR 81 | Temp 97.8°F | Resp 18 | Ht 64.0 in | Wt 119.4 lb

## 2023-09-08 DIAGNOSIS — R110 Nausea: Secondary | ICD-10-CM

## 2023-09-08 DIAGNOSIS — C19 Malignant neoplasm of rectosigmoid junction: Secondary | ICD-10-CM

## 2023-09-08 DIAGNOSIS — K5903 Drug induced constipation: Secondary | ICD-10-CM

## 2023-09-08 DIAGNOSIS — R530 Neoplastic (malignant) related fatigue: Secondary | ICD-10-CM

## 2023-09-08 DIAGNOSIS — R630 Anorexia: Secondary | ICD-10-CM

## 2023-09-08 DIAGNOSIS — C187 Malignant neoplasm of sigmoid colon: Secondary | ICD-10-CM | POA: Diagnosis not present

## 2023-09-08 DIAGNOSIS — G893 Neoplasm related pain (acute) (chronic): Secondary | ICD-10-CM | POA: Diagnosis not present

## 2023-09-08 DIAGNOSIS — Z7189 Other specified counseling: Secondary | ICD-10-CM

## 2023-09-08 DIAGNOSIS — Z515 Encounter for palliative care: Secondary | ICD-10-CM

## 2023-09-08 LAB — CMP (CANCER CENTER ONLY)
ALT: 29 U/L (ref 0–44)
AST: 55 U/L — ABNORMAL HIGH (ref 15–41)
Albumin: 3.8 g/dL (ref 3.5–5.0)
Alkaline Phosphatase: 741 U/L — ABNORMAL HIGH (ref 38–126)
Anion gap: 11 (ref 5–15)
BUN: <5 mg/dL — ABNORMAL LOW (ref 6–20)
CO2: 30 mmol/L (ref 22–32)
Calcium: 9.9 mg/dL (ref 8.9–10.3)
Chloride: 92 mmol/L — ABNORMAL LOW (ref 98–111)
Creatinine: 0.46 mg/dL (ref 0.44–1.00)
GFR, Estimated: >60 mL/min (ref >60)
Glucose, Bld: 105 mg/dL — ABNORMAL HIGH (ref 70–99)
Potassium: 3 mmol/L — ABNORMAL LOW (ref 3.5–5.1)
Sodium: 134 mmol/L — ABNORMAL LOW (ref 135–145)
Total Bilirubin: 0.6 mg/dL (ref 0.0–1.2)
Total Protein: 9.4 g/dL — ABNORMAL HIGH (ref 6.5–8.1)

## 2023-09-08 LAB — CBC WITH DIFFERENTIAL (CANCER CENTER ONLY)
Abs Immature Granulocytes: 0.02 K/uL (ref 0.00–0.07)
Basophils Absolute: 0.1 K/uL (ref 0.0–0.1)
Basophils Relative: 1 %
Eosinophils Absolute: 0.2 K/uL (ref 0.0–0.5)
Eosinophils Relative: 2 %
HCT: 34.5 % — ABNORMAL LOW (ref 36.0–46.0)
Hemoglobin: 11.1 g/dL — ABNORMAL LOW (ref 12.0–15.0)
Immature Granulocytes: 0 %
Lymphocytes Relative: 33 %
Lymphs Abs: 2.3 K/uL (ref 0.7–4.0)
MCH: 27.5 pg (ref 26.0–34.0)
MCHC: 32.2 g/dL (ref 30.0–36.0)
MCV: 85.6 fL (ref 80.0–100.0)
Monocytes Absolute: 0.5 K/uL (ref 0.1–1.0)
Monocytes Relative: 7 %
Neutro Abs: 4 K/uL (ref 1.7–7.7)
Neutrophils Relative %: 57 %
Platelet Count: 370 K/uL (ref 150–400)
RBC: 4.03 MIL/uL (ref 3.87–5.11)
RDW: 17.6 % — ABNORMAL HIGH (ref 11.5–15.5)
WBC Count: 7 K/uL (ref 4.0–10.5)
nRBC: 0 % (ref 0.0–0.2)

## 2023-09-08 LAB — MAGNESIUM: Magnesium: 2 mg/dL (ref 1.7–2.4)

## 2023-09-08 MED ORDER — MORPHINE SULFATE (PF) 2 MG/ML IV SOLN
2.0000 mg | Freq: Once | INTRAVENOUS | Status: AC
  Administered 2023-09-08: 2 mg via INTRAVENOUS
  Filled 2023-09-08: qty 1

## 2023-09-08 MED ORDER — SODIUM CHLORIDE 0.9 % IV SOLN: INTRAVENOUS | Status: AC

## 2023-09-08 MED ORDER — PROCHLORPERAZINE EDISYLATE 10 MG/2ML IJ SOLN
10.0000 mg | Freq: Once | INTRAMUSCULAR | Status: AC
  Administered 2023-09-08: 10 mg via INTRAVENOUS
  Filled 2023-09-08: qty 2

## 2023-09-08 NOTE — Patient Instructions (Signed)
 PICC Home Care Guide A peripherally inserted central catheter (PICC) is a form of IV access that allows medicines and IV fluids to be quickly put into the blood and spread throughout the body. The PICC is a long, thin, flexible tube (catheter) that is put into a vein in a person's arm or leg. The catheter ends in a large vein just outside the heart called the superior vena cava (SVC). After the PICC is put in, a chest X-ray may be done to make sure that it is in the right place. A PICC may be placed for different reasons, such as: To give medicines and liquid nutrition. To give IV fluids and blood products. To take blood samples often. If there is trouble placing a peripheral intravenous (PIV) catheter. If cared for properly, a PICC can remain in place for many months. Having a PICC can allow you to go home from the hospital sooner and continue treatment at home. Medicines and PICC care can be managed at home by a family member, caregiver, or home health care team. What are the risks? Generally, having a PICC is safe. However, problems may occur, including: A blood clot (thrombus) forming in or at the end of the PICC. A blood clot forming in a vein (deep vein thrombosis) or traveling to the lung (pulmonary embolism). Inflammation of the vein (phlebitis) in which the PICC is placed. Infection at the insertion site or in the blood. Blood infections from central lines, like PICCs, can be serious and often require a hospital stay. PICC malposition, or PICC movement or poor placement. A break or cut in the PICC. Do not use scissors near the PICC. Nerve or tendon irritation or injury during PICC insertion. How to care for your PICC Please follow the specific guidelines provided by your health care provider. Preventing infection You and any caregivers should wash your hands often with soap and water for at least 20 seconds. Wash hands: Before touching the PICC or the infusion device. Before changing a  bandage (dressing). Do not change the dressing unless you have been taught to do so and have shown you are able to change it safely. Flush the PICC as told. Tell your health care provider right away if the PICC is hard to flush or does not flush. Do not use force to flush the PICC. Use clean and germ-free (sterile) supplies only. Keep the supplies in a dry place. Do not reuse needles, syringes, or any other supplies. Reusing supplies can lead to infection. Keep the PICC dressing dry and secure it with tape if the edges stop sticking to your skin. Check your PICC insertion site every day for signs of infection. Check for: Redness, swelling, or pain. Fluid or blood. Warmth. Pus or a bad smell. Preventing other problems Do not use a syringe that is less than 10 mL to flush the PICC. Do not have your blood pressure checked on the arm in which the PICC is placed. Do not ever pull or tug on the PICC. Keep it secured to your arm with tape or a stretch wrap when not in use. Do not take the PICC out yourself. Only a trained health care provider should remove the PICC. Keep pets and children away from your PICC. How to care for your PICC dressing Keep your PICC dressing clean and dry to prevent infection. Do not take baths, swim, or use a hot tub until your health care provider approves. Ask your health care provider if you can take  showers. You may only be allowed to take sponge baths. When you are allowed to shower: Ask your health care provider to teach you how to wrap the PICC. Cover the PICC with clear plastic wrap and tape to keep it dry while showering. Follow instructions from your health care provider about how to take care of your insertion site and dressing. Make sure you: Wash your hands with soap and water for at least 20 seconds before and after you change your dressing. If soap and water are not available, use hand sanitizer. Change your dressing only if taught to do so by your health care  provider. Your PICC dressing needs to be changed if it becomes loose or wet. Leave stitches (sutures), skin glue, or adhesive strips in place. These skin closures may need to stay in place for 2 weeks or longer. If adhesive strip edges start to loosen and curl up, you may trim the loose edges. Do not remove adhesive strips completely unless your health care provider tells you to do that. Follow these instructions at home: Disposal of supplies Throw away any syringes in a disposal container that is meant for sharp items (sharps container). You can buy a sharps container from a pharmacy, or you can make one by using an empty, hard plastic bottle with a lid. Place any used dressings or infusion bags into a plastic bag. Throw that bag in the trash. General instructions  Always carry your PICC identification card or wear a medical alert bracelet. Keep the tube clamped at all times, unless it is being used. Always carry a smooth-edge clamp with you to clamp the PICC if it breaks. Do not use scissors or sharp objects near the tube. You may bend your arm and move it freely. If your PICC is near or at the bend of your elbow, avoid activity with repeated motion at the elbow. Avoid lifting heavy objects as told by your health care provider. Keep all follow-up visits. This is important. You will need to have your PICC dressing changed at least once a week. Contact a health care provider if: You have pain in your arm, ear, face, or teeth. You have a fever or chills. You have redness, swelling, or pain around the insertion site. You have fluid or blood coming from the insertion site. Your insertion site feels warm to the touch. You have pus or a bad smell coming from the insertion site. Your skin feels hard and raised around the insertion site. Your PICC dressing has gotten wet or is coming off and you have not been taught how to change it. Get help right away if: You have problems with your PICC, such as  your PICC: Was tugged or pulled and has partially come out. Do not  push the PICC back in. Cannot be flushed, is hard to flush, or leaks around the insertion site when it is flushed. Makes a flushing sound when it is flushed. Appears to have a hole or tear. Is accidentally pulled all the way out. If this happens, cover the insertion site with a gauze dressing. Do not throw the PICC away. Your health care provider will need to check it to be sure the entire catheter came out. You feel your heart racing or skipping beats, or you have chest pain. You have shortness of breath or trouble breathing. You have swelling, redness, warmth, or pain in the arm in which the PICC is placed. You have a red streak going up your arm that  starts under the PICC dressing. These symptoms may be an emergency. Get help right away. Call 911. Do not wait to see if the symptoms will go away. Do not drive yourself to the hospital. Summary A peripherally inserted central catheter (PICC) is a long, thin, flexible tube (catheter) that is put into a vein in the arm or leg. If cared for properly, a PICC can remain in place for many months. Having a PICC can allow you to go home from the hospital sooner and continue treatment at home. The PICC is inserted using a germ-free (sterile) technique by a specially trained health care provider. Only a trained health care provider should remove it. Do not have your blood pressure checked on the arm in which your PICC is placed. Always keep your PICC identification card with you. This information is not intended to replace advice given to you by your health care provider. Make sure you discuss any questions you have with your health care provider. Document Revised: 07/18/2020 Document Reviewed: 07/18/2020 Elsevier Patient Education  2024 [employer].

## 2023-09-08 NOTE — Progress Notes (Signed)
 [facility] OFFICE PROGRESS NOTE   Diagnosis: Colorectal cancer  INTERVAL HISTORY:   April [name] returns for follow-up.  She completed cycle 2 FOLFOX 08/26/2023.  She had mild nausea after the chemotherapy.  No mouth sores.  No diarrhea.  No significant cold sensitivity.  No signs of allergic reaction.  In general she is not feeling well.  She is tired and in pain.  She ran out of her pain medication several days ago.  She reports intermittent vomiting recently.  She had a bowel movement earlier today.  She notes increased abdominal pain.  No fever.  Objective:  Vital signs in last 24 hours:  Blood pressure (!) 145/82, pulse 81, temperature 97.8 F (36.6 C), temperature source Temporal, resp. rate 18, height 5' 4 (1.626 m), weight 119 lb 6.4 oz (54.2 kg), last menstrual period 01/07/2006, SpO2 100%.    HEENT: No ulcers. Resp: Lungs clear bilaterally. Cardio: Regular rate and rhythm. GI: Abdomen is distended, generalized tenderness with light touch.  She declines deep palpation. Vascular: No leg edema. Neuro: Alert and oriented. Skin: Warm and dry. PICC site is without erythema.  Lab Results:  Lab Results  Component Value Date   WBC 7.0 09/08/2023   HGB 11.1 (L) 09/08/2023   HCT 34.5 (L) 09/08/2023   MCV 85.6 09/08/2023   PLT 370 09/08/2023   NEUTROABS 4.0 09/08/2023    Imaging:  No results found.  Medications: I have reviewed the patient's current medications.  Assessment/Plan: Sigmoid colon cancer, stage IV (pT4a,pN2b,M1c) Colonoscopy 03/24/2019-3 rectal polyps-hyperplastic polyps, distal colon biopsy-at least intramucosal adenocarcinoma, completely obstructing mass in the distal sigmoid colon, could not be traversed, intact mismatch repair protein expression 03/24/2019-CEA 56.1 03/29/2019 CT abdomen/pelvis-circumferential thickening involving the entire mid and distal sigmoid colon to the level of the rectosigmoid junction, solid/cystic mass in the  left ovary, bilateral nephrolithiasis Robotic assisted low anterior resection, mesenteric lymphadenectomy, bilateral salpingo-oophorectomy 04/08/2019 Pathology ([facility] review of outside pathology) metastatic adenocarcinoma involving the peritoneum overlying the round ligament, serosa of the urinary bladder, serosa of the right ureter a sacral area, pelvic peritoneum, and left ovary.  Omental biopsy with focal mucin pools with no carcinoma cells identified, right hemidiaphragm biopsy involved by metastatic adenocarcinoma, invasive adenocarcinoma the sigmoid colon, moderately differentiated, T4a, perineural and vascular invasion present, 7/22 lymph nodes, multiple tumor deposits, resection margins negative Negative for PD-L1, low probability of MSI-high, HER-2 negative, negative for BRAF, NRAS and KRAS alterations CTs 05/19/2019-no evidence of metastatic disease, findings suspicious for colitis of the transverse and ascending colon, small amount of ascites in the cul-de-sac, bilateral renal calculi Cycle 1 FOLFIRI 05/24/2019, bevacizumab  added with cycle 2 Cycle 4 FOLFIRI/bevacizumab  07/05/2019 Cycle 5 FOLFIRI 07/27/2019, bevacizumab  held secondary to hypertension Cycle 6 FOLFIRI 08/10/1999, bevacizumab  held secondary to hypertension CTs 08/30/2019-no evidence of recurrent disease, new subsolid right lower lobe nodule felt to be inflammatory, emphysema Cycle 7 FOLFIRI 09/26/2019, bevacizumab  remains on hold secondary to hypertension Cycle 8 FOLFIRI 10/17/2019, bevacizumab  held secondary to hypertension, Udenyca  added for neutropenia Cycle 9 FOLFIRI 11/07/2019, bevacizumab  held secondary to hypertension, Udenyca  Cycle 10 FOLFIRI 11/28/2019, bevacizumab  held, Udenyca  Cycle 11 FOLFIRI 12/26/2019, bevacizumab  held, Udenyca  CTs 01/25/2020-no evidence of recurrent disease, resolution of right lower lobe nodule Maintenance Xeloda  beginning 02/06/2020 CTs 04/25/2020- no evidence of metastatic disease, multiple bilateral  renal calculi without hydronephrosis Maintenance Xeloda  continued CT abdomen/pelvis without contrast 08/10/2020-bilateral staghorn renal calculi, no evidence of metastatic disease; addendum 08/23/2020-small but increasing omental nodules. Cycle 1 FOLFIRI/panitumumab  09/19/2020 Cycle 2  FOLFIRI/Panitumumab  10/03/2020 Cycle 3 FOLFIRI/Panitumumab  10/17/2020 Cycle 4 FOLFIRI/panitumumab  10/31/2020 Cycle 5 FOLFIRI/Panitumumab  11/14/2020 CTs 11/26/2020-stable omental metastases compared to 10/22/2020, mildly decreased from 08/10/2020.  Left lower quadrant tiny paracolic gutter implant slightly decreased from CT 08/10/2020.  No new or progressive metastatic disease in the abdomen or pelvis. Cycle 6 FOLFIRI/Panitumumab  11/28/2020, irinotecan  dose reduced, treatment schedule adjusted to every 3 weeks going forward Cycle 7 FOLFIRI/panitumumab  12/24/2020 Cycle 8 FOLFIRI/Panitumumab  01/16/2021--treatment held due to hypertension in the infusion area. Cycle 8 FOLFIRI/panitumumab  01/21/2021 Cycle 9 FOLFIRI/panitumumab  02/11/2021 Cycle 10 FOLFIRI/Panitumumab  03/04/2021 Cycle 11 FOLFIRI/Panitumumab  03/25/2021 CT abdomen/pelvis 04/10/2021-stable right omental and left posterior paracolic gutter nodules Cycle 12 FOLFIRI/panitumumab  04/15/2021 Cycle 13 FOLFIRI/panitumumab  05/06/2021 Cycle 14 FOLFIRI/Panitumumab  05/27/2021 Cycle 15 FOLFIRI/Panitumumab  06/17/2021 CT abdomen/pelvis 07/05/2021-slight enlargement of a dominant right omental implant, no new implants Patient requested a treatment break CT abdomen/pelvis 10/04/2021-mild increase in size of multiple omental soft tissue nodules, 4 mm calculus in the distal left ureter adjacent to the ureteral stent Cycle 1 Lonsurf  10/14/2021 10/28/2021 Avastin  every 2 weeks Cycle 2 Lonsurf  11/11/2021 Cycle 3 Lonsurf  12/09/2021 Cycle 4 Lonsurf  01/06/2022 Avastin  held 01/20/2022 due to proteinuria, 24-hour urine 43 mg CT/pelvis 01/30/2022-possible new peritoneal implant at the transverse colon, no  significant change in other omental/peritoneal implants, stable chronic rectal wall thickening, status post removal of double-J left ureteral stent with no evidence of hydronephrosis or ureteral calculus, nonobstructing bilateral renal calculi Cycle 5 Lonsurf  02/05/2022 or 02/06/2022 Avastin  every 2 weeks Cycle 6 Lonsurf  03/03/2022 CTs 04/05/2022-increase in size and number of peritoneal implants compared to January 2024 central liver mass narrowing the anterior branch of the right portal vein, decreased biliary duct dilation, biliary stents in place, stable right cardiophrenic lymph nodes, separate pigtail stent in the lumen of the proximal duodenum Cycle 1 FOLFOX 04/14/2022 CT abdomen/pelvis 04/16/2022-stent within the third portion of the duodenum, stable peritoneal nodules, indwelling biliary stents with mild left biliary duct dilation Cycle 2 FOLFOX 04/28/2022, Emend and prophylactic dexamethasone  added Cycle 3 FOLFOX 05/12/2022, Aloxi , Emend, prophylactic dexamethasone , Compazine  and lorazepam  as needed Chemotherapy held 05/25/2020 due to severe hypertension, evaluated in the emergency department Cycle 4 FOLFOX 06/03/2022 Cycle 5 FOLFOX 06/16/2022, oxaliplatin  dose reduced and 5-FU bolus eliminated due to neutropenia CT 06/25/2022-unchanged soft tissue at the hepatic hilum, unchanged, bile duct stent, decreased size of peritoneal and omental nodules and diminished volume of likely fluid in the lower left pelvis Cycle 6 FOLFOX 06/30/2022 Cycle 7 FOLFOX 07/21/2022 CT 07/24/2022 (in the emergency department with nausea, headache, abdominal pain)-no acute pathology.  No significant interval change in omental nodularity. Cycle 8 FOLFOX 08/04/2022, pruritus on the palms at the end of the oxaliplatin  infusion, Pepcid  and Benadryl  given, symptoms resolved, infusion not resumed Cycle 9 FOLFOX 08/18/2022-Pepcid  and Benadryl  added to premedications, Oxaliplatin  diluted in a larger volume and infusion time increased Cycle 10  FOLFOX 09/08/2022 Cycle 11 FOLFOX 09/22/2022 10/06/2022 patient declined treatment CTs 10/15/2022-similar soft tissue fullness in the hepatic hilum with biliary stents in place, decrease in omental/peritoneal metastases Maintenance capecitabine  10/26/2022 CT abdomen/pelvis 11/19/2022-mild increase in tumor studding at the left paracolic gutter and omentum, circumferential wall thickening of the rectum, biliary stents in place with minimal increase in intrahepatic biliary dilation, nonobstructive renal calculi Colonoscopy 11/23/2022-anastomosis at 15 cm from the anal verge-patent, 2 cm polypoid tissue at the anastomosis biopsied, invasive adenocarcinoma moderately differentiated, preserved expression of major MMR proteins CTs 12/15/2022: Possible developing nodule in the lateral left liver, increased omental nodularity, persistent wall thickening in the upper rectum CT abdomen/pelvis  12/24/2022: Increased thickened fold in the transverse colon-infectious/inflammatory, 2.1 cm lateral left liver lesion, potential new 5 mm segment 5 lesion, stable biliary stents with intrahepatic biliary dilation, central biliary wall thickening near the hepatic hilum, multiple peritoneal/omental implants unchanged from 12/15/2022, but likely larger than 11/19/2022 Cycle 1 irinotecan /Panitumumab  01/06/2023 Cycle 2 irinotecan /Panitumumab  01/20/2023 CTs 01/24/2023-slight increase in omental nodularity. Cycle 3 irinotecan /Panitumumab  02/03/2023 Cycle 4 irinotecan /panitumumab  02/17/2023 CTs abdomen/pelvis 02/24/2023-subtly hypoattenuating lesion within the peripheral aspect of hepatic segment 2 redemonstrated.  Additional scattered subcentimeter low-density lesions within the liver too small to characterize.  Prominent intrahepatic biliary dilatation with biliary stents in place.  Similar degree of nodularity along the omentum and left paracolic gutter.  No ascites. CTs 03/09/2023-indwelling endoscopically placed stents; level of intrahepatic  biliary duct dilatation is slightly improved on the left and some on the right.  Gallbladder is dilated with wall edema which is increasing.  More nodular tissue extending up along the porta hepatis.  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  Rectum with nodular wall thickening with extension into the adjacent tissue, nodular areas seen extending perirectal and presacral spaces.  Peritoneal carcinomatosis again identified. Irinotecan /Panitumumab  04/02/2023 Irinotecan /panitumumab  04/16/2023 Irinotecan /Panitumumab  05/07/2023 Irinotecan /Panitumumab  06/19/2023 CT abdomen/pelvis 06/21/2023-stable heterogeneous enhancement throughout the liver with nodularity along the surface of the liver.  Small amount of perihepatic ascites.  Stable nodularity right abdomen.  Circumferential rectal wall thickening/mass unchanged. Irinotecan /Panitumumab  discontinued 07/10/2023 due to lack of clinical benefit/poor tolerance Appointment with Dr. Sheilda 07/23/2023 CTs 08/02/2023-mid rectal soft tissue mass, mildly progressive.  Right hepatic metastases, progressive.  Peritoneal disease/omental caking stable versus mildly progressive.  Small volume abdominopelvic ascites mildly progressive. Cycle 1 FOLFOX 08/05/2023 Cycle 2 FOLFOX 08/20/2023 (Pepcid  and Benadryl  daily x 2 days before treatment, Pepcid  continued for 2 days post oxaliplatin ; oxaliplatin  administration time increased)   Hypertension G4 P3, twins Kidney stones-bilateral staghorn renal calculi on CT 08/10/2020 Infected Port-A-Cath 09/12/2019-placed on Augmentin , referred for Port-A-Cath removal; Port-A-Cath removed 09/14/2019; PICC line placed 09/14/2019; culture staph aureus.  Course of Septra  completed. Neutropenia secondary to chemotherapy-Udenyca  added with cycle 8 FOLFIRI Right nephrostomy tube 09/12/2020; stent placed 10/09/2020, percutaneous nephrostolithotomy treatment of right-sided kidney stones 10/09/2020, malpositioned right ureter stent replacement 10/23/2020,  right ureter stent removed 10/29/2020 Percutaneous left nephrostolithotomy 10/01/2021-no renal stone identified, left ureter stent left in place Cystoscopy 10/14/2021-stent removed 8.  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  2 moderate biliary strictures found in the hepatic duct system, malignant appearing.  Left and right hepatic duct and all intrahepatic branches were moderately dilated secondary to the strictures.  The strictures were dilated.  Plastic stent placed into the left hepatic duct and right hepatic duct. 9.  Admission 04/16/2022 with nausea/vomiting and abdominal pain, felt to be acute toxicity related to chemotherapy, discharged home 04/18/2022 10.  Admission 11/19/2022 with nausea/vomiting and abdominal pain-improved 11.  Admission 12/15/2022 with fever and right submandibular swelling/tenderness CT neck 12/15/2022: 2 mm distal right submandibular duct calculus with mild inflammatory change adjacent to the right submandibular gland 12.  Admission 12/24/2022  with a fever nausea/vomiting, diarrhea, Klebsiella bacteremia completed course of Augmentin  13.  Admission with abdominal pain and nausea/vomiting 03/09/2023 through 03/14/2023-concern for gallbladder inflammation.  Not felt to be a surgical candidate.  Percutaneous cholecystostomy tube placed 03/11/2023.  Tube removed 04/24/2023. 14.  Admission 06/20/2023 with  abdominal pain and constipation, pain improved partially following a bowel movement  Disposition: April [name] has metastatic colorectal cancer.  She is on active treatment with repeat FOLFOX.  No signs of an allergic reaction with cycle 2.  In general seems to have tolerated the chemotherapy well.  She presents today prior to cycle 3.  She ran out of pain medication several days ago.  She declines to proceed with chemotherapy tomorrow.  We will reschedule to next week.  She agrees to IV fluids today.  We will also give her IV Compazine  and IV morphine .  We discussed the low potassium.  She declines IV potassium.  She will continue oral potassium.    She has an appointment with palliative care this afternoon.  She will return for a follow-up appointment 09/16/2023.  We are available to see her sooner if needed.  Plan reviewed with Dr. Cloretta.    Olam Ned ANP/GNP-BC   09/08/2023  10:35 AM

## 2023-09-09 ENCOUNTER — Inpatient Hospital Stay

## 2023-09-09 ENCOUNTER — Other Ambulatory Visit (HOSPITAL_COMMUNITY): Payer: Self-pay

## 2023-09-09 ENCOUNTER — Telehealth: Payer: Self-pay | Admitting: Dietician

## 2023-09-09 NOTE — Telephone Encounter (Signed)
 Patient screened on MST. First attempt to reach since previous nutrition consult. Unable to leave voice mail, provided my cell# and availability in text to return call to set up a remote nutrition consult.  Micheline Craven, RDN, LDN Registered Dietitian, [facility] Part Time Remote (Usual office hours: Tuesday-Thursday) Cell: [phone]

## 2023-09-11 ENCOUNTER — Inpatient Hospital Stay

## 2023-09-14 ENCOUNTER — Other Ambulatory Visit: Payer: Self-pay

## 2023-09-14 ENCOUNTER — Encounter: Payer: Self-pay | Admitting: Oncology

## 2023-09-15 ENCOUNTER — Encounter: Payer: Self-pay | Admitting: Oncology

## 2023-09-15 ENCOUNTER — Other Ambulatory Visit: Payer: Self-pay

## 2023-09-15 ENCOUNTER — Other Ambulatory Visit (HOSPITAL_BASED_OUTPATIENT_CLINIC_OR_DEPARTMENT_OTHER): Payer: Self-pay

## 2023-09-15 DIAGNOSIS — C189 Malignant neoplasm of colon, unspecified: Secondary | ICD-10-CM

## 2023-09-15 NOTE — Progress Notes (Signed)
 Palliative Medicine [facility]  [phone] [phone]   Name: April [name] Date: 09/15/2023 MRN: [id number]  DOB: 1975-06-15  Patient Care Team: Early, Camie BRAVO, NP as PCP - General (Nurse Practitioner) Cloretta Arley NOVAK, MD as Consulting Physician (Oncology) Pickenpack-Cousar, Fannie SAILOR, NP as Nurse Practitioner (Hospice and Palliative Medicine)   INTERVAL HISTORY: April [name] is a 48 y.o. 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 Palliative is seeing patient for symptom management and goals of care   SOCIAL HISTORY:     reports that she has quit smoking. Her smoking use included cigarettes. She has never used smokeless tobacco. She reports that she does not currently use alcohol. She reports that she does not currently use drugs after having used the following drugs: Marijuana.  ADVANCE DIRECTIVES:  None on file   CODE STATUS: Full code  PAST MEDICAL HISTORY: Past Medical History:  Diagnosis Date   Ascending aortic aneurysm (HCC) 12/16/2022   Benign essential hypertension 10/06/2017   Last Assessment & Plan:  Formatting of this note might be different from the original. Patient's blood pressure noted to be elevated at today's visit 188/98.  Patient will be provided with antihypertensives in the treatment center if needed in order to meet her Avastin  parameters.  Patient recommended to follow up with her primary care physician regarding medication management.  Patient is not cur   Cancer (HCC)    Change in bowel habits 06/28/2022   Colon cancer (HCC)    History of colon  cancer 06/28/2022   Hypertension    Prolonged QT interval 01/07/2018   Renal calculi    bilateral   Sepsis due to undetermined organism (HCC) 12/24/2022   Sialoadenitis of submandibular gland 12/16/2022    ALLERGIES:  is allergic to lisinopril, irinotecan , leucovorin  calcium , and oxaliplatin .  MEDICATIONS:  Current Outpatient Medications  Medication Sig Dispense Refill   acetaminophen  (TYLENOL ) 500 MG tablet Take 500 mg by mouth in the morning, at noon, and at bedtime. (Patient taking differently: Take 500 mg by mouth 2 (two) times daily.)     alum & mag hydroxide-simeth (MAALOX/MYLANTA) 200-200-20 MG/5ML suspension Take 30 mLs by mouth every 6 (six) hours as needed for indigestion or heartburn. (Patient not taking: Reported on 09/08/2023) 355 mL 0   amLODipine  (NORVASC ) 10 MG tablet Take 1 tablet (10 mg total) by mouth daily. 90 tablet 0   cloNIDine  (CATAPRES ) 0.1 MG tablet Take 3 tablets (0.3 mg total) by mouth 3 (three) times daily. (Patient not taking: Reported on 09/08/2023) 90 tablet 2   dexamethasone  (DECADRON ) 2 MG tablet Take 1 tablet (2 mg total) by mouth daily. 30 tablet 0   dicyclomine  (BENTYL ) 20 MG tablet Take 1 tablet (20 mg total) by mouth 3 (three) times daily as needed for spasms (rectal/AB pain). 60 tablet 1   docusate sodium  (COLACE) 100 MG capsule Take 1 capsule (100 mg total) by mouth 2 (two) times daily. 60 capsule 3   famotidine  (PEPCID ) 20 MG tablet Take 1 tablet (20 mg total) once daily x 2 days prior to oxaliplatin  and daily x 2 days after oxaliplatin  dose. Also take Benadryl  25 mg (OTC) daily x 2 days prior to oxaliplatin . 24  tablet 1   fentaNYL  (DURAGESIC ) 50 MCG/HR Place 1 patch onto the skin every 3 (three) days. 10 patch 0   HYDROmorphone  (DILAUDID ) 8 MG tablet Take 1 tablet (8 mg total) by mouth every 3 (three) hours as needed for moderate pain (pain score 4-6) or severe pain (pain score 7-10). 100 tablet 0   lactulose  (CHRONULAC ) 10 GM/15ML solution Take 15-30  mLs (10-20 g total) by mouth daily as needed for mild constipation. 236 mL 2   metoprolol  tartrate (LOPRESSOR ) 100 MG tablet Take 1 tablet (100 mg total) by mouth 2 (two) times daily. (Patient not taking: Reported on 09/08/2023) 30 tablet 1   pantoprazole  (PROTONIX ) 40 MG tablet Take 1 tablet (40 mg total) by mouth daily before breakfast. 30 tablet 6   polyethylene glycol powder (GLYCOLAX /MIRALAX ) 17 GM/SCOOP powder Take 17 g by mouth daily as needed. 238 g 0   potassium chloride  (MICRO-K ) 10 MEQ CR capsule Take 2 capsules (20 mEq total) by mouth 2 (two) times daily. (Patient taking differently: Take 30 mEq by mouth 2 (two) times daily.) 120 capsule 2   prochlorperazine  (COMPAZINE ) 10 MG tablet Take 1 tablet (10 mg total) by mouth every 6 (six) hours as needed for nausea or vomiting. (Patient not taking: Reported on 09/08/2023) 60 tablet 3   senna (SENOKOT) 8.6 MG TABS tablet Take 2 tablets (17.2 mg total) by mouth at bedtime. 120 tablet 0   Sodium Chloride  Flush (NORMAL SALINE FLUSH) 0.9 % SOLN Use 5 mLs by Intracatheter route daily in biliary drain at 6 (six) AM as directed (Patient taking differently: 5 mLs by Intracatheter route every 3 (three) days.) 150 mL 3   No current facility-administered medications for this visit.    VITAL SIGNS: BP (!) 191/139 (BP Location: Left Arm, Patient Position: Sitting) Comment: provider notified  Pulse 100   Temp (!) 97.3 F (36.3 C) (Temporal)   Resp 19   Wt 121 lb 3.2 oz (55 kg)   LMP 01/07/2006   SpO2 100%   BMI 20.80 kg/m  Filed Weights   09/08/23 1318  Weight: 121 lb 3.2 oz (55 kg)    Estimated body mass index is 20.8 kg/m as calculated from the following:   Height as of an earlier encounter on 09/08/23: 5' 4 (1.626 m).   Weight as of this encounter: 121 lb 3.2 oz (55 kg).   PERFORMANCE STATUS (ECOG) : 1 - Symptomatic but completely ambulatory  Assessment Chronically ill appearing, thin Hypertensive, RRR Normal breathing pattern AAO x3,  weak   IMPRESSION: Discussed the use of AI scribe software for clinical note transcription with the patient, who gave verbal consent to proceed.  History of Present Illness April [name] is a 48 year old female who presented to clinic for symptom management follow-up. Patient also seen by her Oncology team at [facility] earlier today. During visit she was administered IVF however declined potassium and other support per Oncology team. Patient is accompanied by her daugher. She is complaining of some abdominal pain and constipation. Reports pain is severe and uncontrolled. Is questioning need for ED work-up in addition to nausea.  Appetite fluctuates. Some days are better than others. Denies concerns for nausea, vomiting, or diarrhea.  She has been experiencing worsening pain over the past few days, which she attributes to running low on her prescribed pain medications. Initially, she took two pills but recently reduced to one pill and Tylenol  due to her limited supply. She has not taken any pain medication  in the last couple of days, leading to increased discomfort. I advised against ED visit as patient's pain medications have been ready and she has yet to pick-up. She and family states plans to pick up   Ms. Blocher's current regimen includes a Fentanyl  patch 50mcg but has not changed it recently because she ran out. She has been taking Dilaudid  8mg  but is currently out of it. No adjustments to current regimen at this time.   I empathetically approached discussions with patient expressing concerns about recent recurrent hospitalizations and poor performance status overall. I encouraged patient to consider her current quality of life and what is most important to her. She verbalized understanding and appreciation expressing she is remaining helpful for some stability however aware that her health is not the best. I encouraged ongoing discussions with her Oncology team with realistic  expectations and understanding to allow for realistic outcomes and support to support complex decisions.   Assessment & Plan Cancer pain management is suboptimal due to running out of pain medication. She has been taking less medication than prescribed, contributing to her feeling unwell. An intrathecal pain pump is being considered for better pain management, involving a test dose to ensure compatibility and effectiveness. The pump delivers medication directly to the spinal area, allowing for better pain control with potentially fewer oral medications. - Pick up pain medication from the pharmacy and resume prescribed dosage. - Contact interventional radiology to discuss the possibility of an intrathecal pain pump. - Schedule a test dose for the intrathecal pain pump once approved.  - Consider emergency room visit if symptoms worsen in the next 2-3 days.  Hypokalemia Mild hypokalemia noted on recent lab work. - Take potassium supplements as prescribed.  Nausea Nausea is present and may be related to inadequate pain control or other factors. - Use nausea medication as needed.  I will plan to see patient back in 2-3 weeks. Sooner if needed.   Patient expressed understanding and was in agreement with this plan. She also understands that She can call the clinic at any time with any questions, concerns, or complaints.   Any controlled substances utilized were prescribed in the context of palliative care. PDMP has been reviewed.   Visit consisted of counseling and education dealing with the complex and emotionally intense issues of symptom management and palliative care in the setting of serious and potentially life-threatening illness.  Levon Borer, AGPCNP-BC  Palliative Medicine Team/[facility]

## 2023-09-16 ENCOUNTER — Encounter: Payer: Self-pay | Admitting: Nurse Practitioner

## 2023-09-16 ENCOUNTER — Inpatient Hospital Stay (HOSPITAL_BASED_OUTPATIENT_CLINIC_OR_DEPARTMENT_OTHER): Admitting: Nurse Practitioner

## 2023-09-16 ENCOUNTER — Other Ambulatory Visit

## 2023-09-16 ENCOUNTER — Inpatient Hospital Stay

## 2023-09-16 ENCOUNTER — Ambulatory Visit: Admitting: Nurse Practitioner

## 2023-09-16 ENCOUNTER — Inpatient Hospital Stay: Attending: Oncology

## 2023-09-16 VITALS — BP 156/82 | HR 95 | Temp 97.8°F | Resp 18 | Ht 64.0 in | Wt 119.8 lb

## 2023-09-16 DIAGNOSIS — C19 Malignant neoplasm of rectosigmoid junction: Secondary | ICD-10-CM | POA: Diagnosis not present

## 2023-09-16 DIAGNOSIS — R112 Nausea with vomiting, unspecified: Secondary | ICD-10-CM | POA: Diagnosis not present

## 2023-09-16 DIAGNOSIS — C187 Malignant neoplasm of sigmoid colon: Secondary | ICD-10-CM | POA: Insufficient documentation

## 2023-09-16 DIAGNOSIS — E876 Hypokalemia: Secondary | ICD-10-CM | POA: Diagnosis not present

## 2023-09-16 DIAGNOSIS — I10 Essential (primary) hypertension: Secondary | ICD-10-CM | POA: Diagnosis not present

## 2023-09-16 DIAGNOSIS — C787 Secondary malignant neoplasm of liver and intrahepatic bile duct: Secondary | ICD-10-CM | POA: Insufficient documentation

## 2023-09-16 DIAGNOSIS — C786 Secondary malignant neoplasm of retroperitoneum and peritoneum: Secondary | ICD-10-CM | POA: Diagnosis not present

## 2023-09-16 DIAGNOSIS — C7962 Secondary malignant neoplasm of left ovary: Secondary | ICD-10-CM | POA: Diagnosis not present

## 2023-09-16 DIAGNOSIS — Z5111 Encounter for antineoplastic chemotherapy: Secondary | ICD-10-CM | POA: Insufficient documentation

## 2023-09-16 DIAGNOSIS — C7951 Secondary malignant neoplasm of bone: Secondary | ICD-10-CM | POA: Insufficient documentation

## 2023-09-16 DIAGNOSIS — Z79631 Long term (current) use of antimetabolite agent: Secondary | ICD-10-CM | POA: Diagnosis not present

## 2023-09-16 DIAGNOSIS — C7989 Secondary malignant neoplasm of other specified sites: Secondary | ICD-10-CM | POA: Insufficient documentation

## 2023-09-16 DIAGNOSIS — C189 Malignant neoplasm of colon, unspecified: Secondary | ICD-10-CM

## 2023-09-16 DIAGNOSIS — Z79630 Long term (current) use of alkylating agent: Secondary | ICD-10-CM | POA: Insufficient documentation

## 2023-09-16 LAB — CBC WITH DIFFERENTIAL (CANCER CENTER ONLY)
Abs Immature Granulocytes: 0.02 K/uL (ref 0.00–0.07)
Basophils Absolute: 0.1 K/uL (ref 0.0–0.1)
Basophils Relative: 1 %
Eosinophils Absolute: 0.1 K/uL (ref 0.0–0.5)
Eosinophils Relative: 2 %
HCT: 32.6 % — ABNORMAL LOW (ref 36.0–46.0)
Hemoglobin: 10.5 g/dL — ABNORMAL LOW (ref 12.0–15.0)
Immature Granulocytes: 0 %
Lymphocytes Relative: 37 %
Lymphs Abs: 2.5 K/uL (ref 0.7–4.0)
MCH: 27.7 pg (ref 26.0–34.0)
MCHC: 32.2 g/dL (ref 30.0–36.0)
MCV: 86 fL (ref 80.0–100.0)
Monocytes Absolute: 0.6 K/uL (ref 0.1–1.0)
Monocytes Relative: 8 %
Neutro Abs: 3.5 K/uL (ref 1.7–7.7)
Neutrophils Relative %: 52 %
Platelet Count: 339 K/uL (ref 150–400)
RBC: 3.79 MIL/uL — ABNORMAL LOW (ref 3.87–5.11)
RDW: 17.7 % — ABNORMAL HIGH (ref 11.5–15.5)
WBC Count: 6.8 K/uL (ref 4.0–10.5)
nRBC: 0 % (ref 0.0–0.2)

## 2023-09-16 LAB — CMP (CANCER CENTER ONLY)
ALT: 26 U/L (ref 0–44)
AST: 59 U/L — ABNORMAL HIGH (ref 15–41)
Albumin: 3.4 g/dL — ABNORMAL LOW (ref 3.5–5.0)
Alkaline Phosphatase: 710 U/L — ABNORMAL HIGH (ref 38–126)
Anion gap: 11 (ref 5–15)
BUN: <5 mg/dL — ABNORMAL LOW (ref 6–20)
CO2: 30 mmol/L (ref 22–32)
Calcium: 9.3 mg/dL (ref 8.9–10.3)
Chloride: 93 mmol/L — ABNORMAL LOW (ref 98–111)
Creatinine: 0.48 mg/dL (ref 0.44–1.00)
GFR, Estimated: >60 mL/min (ref >60)
Glucose, Bld: 88 mg/dL (ref 70–99)
Potassium: 2.7 mmol/L — CL (ref 3.5–5.1)
Sodium: 134 mmol/L — ABNORMAL LOW (ref 135–145)
Total Bilirubin: 0.7 mg/dL (ref 0.0–1.2)
Total Protein: 8.8 g/dL — ABNORMAL HIGH (ref 6.5–8.1)

## 2023-09-16 LAB — MAGNESIUM: Magnesium: 1.9 mg/dL (ref 1.7–2.4)

## 2023-09-16 NOTE — Progress Notes (Addendum)
 [facility] OFFICE PROGRESS NOTE   Diagnosis: Colorectal cancer  INTERVAL HISTORY:   [name] returns for follow-up.  She completed cycle 2 FOLFOX 08/26/2023.  She declined to proceed with cycle 3 FOLFOX due to not feeling well.  She is feeling better.  She continues to have intermittent nausea/vomiting.  Abdomen is bloated at times.  Lactulose  helps.  Bowels overall moving regularly.  No neuropathy symptoms.  She did not experience cold sensitivity following the most recent chemotherapy.  No signs of an allergic reaction.  Objective:  Vital signs in last 24 hours:  Blood pressure (!) 156/82, pulse 95, temperature 97.8 F (36.6 C), temperature source Temporal, resp. rate 18, height 5' 4 (1.626 m), weight 119 lb 12.8 oz (54.3 kg), last menstrual period 01/07/2006, SpO2 100%.    HEENT: No thrush or ulcers. Resp: Lungs clear bilaterally. Cardio: Regular rate and rhythm. GI: Abdomen is distended, generalized mild tenderness.  Firm fullness right abdomen. Vascular: No leg edema. Skin: Palms without erythema. Left upper extremity PICC without erythema.  Lab Results:  Lab Results  Component Value Date   WBC 6.8 09/16/2023   HGB 10.5 (L) 09/16/2023   HCT 32.6 (L) 09/16/2023   MCV 86.0 09/16/2023   PLT 339 09/16/2023   NEUTROABS 3.5 09/16/2023    Imaging:  No results found.  Medications: I have reviewed the patient's current medications.  Assessment/Plan: Sigmoid colon cancer, stage IV (pT4a,pN2b,M1c) Colonoscopy 03/24/2019-3 rectal polyps-hyperplastic polyps, distal colon biopsy-at least intramucosal adenocarcinoma, completely obstructing mass in the distal sigmoid colon, could not be traversed, intact mismatch repair protein expression 03/24/2019-CEA 56.1 03/29/2019 CT abdomen/pelvis-circumferential thickening involving the entire mid and distal sigmoid colon to the level of the rectosigmoid junction, solid/cystic mass in the left ovary, bilateral  nephrolithiasis Robotic assisted low anterior resection, mesenteric lymphadenectomy, bilateral salpingo-oophorectomy 04/08/2019 Pathology ([facility] review of outside pathology) metastatic adenocarcinoma involving the peritoneum overlying the round ligament, serosa of the urinary bladder, serosa of the right ureter a sacral area, pelvic peritoneum, and left ovary.  Omental biopsy with focal mucin pools with no carcinoma cells identified, right hemidiaphragm biopsy involved by metastatic adenocarcinoma, invasive adenocarcinoma the sigmoid colon, moderately differentiated, T4a, perineural and vascular invasion present, 7/22 lymph nodes, multiple tumor deposits, resection margins negative Negative for PD-L1, low probability of MSI-high, HER-2 negative, negative for BRAF, NRAS and KRAS alterations CTs 05/19/2019-no evidence of metastatic disease, findings suspicious for colitis of the transverse and ascending colon, small amount of ascites in the cul-de-sac, bilateral renal calculi Cycle 1 FOLFIRI 05/24/2019, bevacizumab  added with cycle 2 Cycle 4 FOLFIRI/bevacizumab  07/05/2019 Cycle 5 FOLFIRI 07/27/2019, bevacizumab  held secondary to hypertension Cycle 6 FOLFIRI 08/10/1999, bevacizumab  held secondary to hypertension CTs 08/30/2019-no evidence of recurrent disease, new subsolid right lower lobe nodule felt to be inflammatory, emphysema Cycle 7 FOLFIRI 09/26/2019, bevacizumab  remains on hold secondary to hypertension Cycle 8 FOLFIRI 10/17/2019, bevacizumab  held secondary to hypertension, Udenyca  added for neutropenia Cycle 9 FOLFIRI 11/07/2019, bevacizumab  held secondary to hypertension, Udenyca  Cycle 10 FOLFIRI 11/28/2019, bevacizumab  held, Udenyca  Cycle 11 FOLFIRI 12/26/2019, bevacizumab  held, Udenyca  CTs 01/25/2020-no evidence of recurrent disease, resolution of right lower lobe nodule Maintenance Xeloda  beginning 02/06/2020 CTs 04/25/2020- no evidence of metastatic disease, multiple bilateral renal calculi without  hydronephrosis Maintenance Xeloda  continued CT abdomen/pelvis without contrast 08/10/2020-bilateral staghorn renal calculi, no evidence of metastatic disease; addendum 08/23/2020-small but increasing omental nodules. Cycle 1 FOLFIRI/panitumumab  09/19/2020 Cycle 2 FOLFIRI/Panitumumab  10/03/2020 Cycle 3 FOLFIRI/Panitumumab  10/17/2020 Cycle 4 FOLFIRI/panitumumab  10/31/2020 Cycle 5 FOLFIRI/Panitumumab  11/14/2020 CTs  11/26/2020-stable omental metastases compared to 10/22/2020, mildly decreased from 08/10/2020.  Left lower quadrant tiny paracolic gutter implant slightly decreased from CT 08/10/2020.  No new or progressive metastatic disease in the abdomen or pelvis. Cycle 6 FOLFIRI/Panitumumab  11/28/2020, irinotecan  dose reduced, treatment schedule adjusted to every 3 weeks going forward Cycle 7 FOLFIRI/panitumumab  12/24/2020 Cycle 8 FOLFIRI/Panitumumab  01/16/2021--treatment held due to hypertension in the infusion area. Cycle 8 FOLFIRI/panitumumab  01/21/2021 Cycle 9 FOLFIRI/panitumumab  02/11/2021 Cycle 10 FOLFIRI/Panitumumab  03/04/2021 Cycle 11 FOLFIRI/Panitumumab  03/25/2021 CT abdomen/pelvis 04/10/2021-stable right omental and left posterior paracolic gutter nodules Cycle 12 FOLFIRI/panitumumab  04/15/2021 Cycle 13 FOLFIRI/panitumumab  05/06/2021 Cycle 14 FOLFIRI/Panitumumab  05/27/2021 Cycle 15 FOLFIRI/Panitumumab  06/17/2021 CT abdomen/pelvis 07/05/2021-slight enlargement of a dominant right omental implant, no new implants Patient requested a treatment break CT abdomen/pelvis 10/04/2021-mild increase in size of multiple omental soft tissue nodules, 4 mm calculus in the distal left ureter adjacent to the ureteral stent Cycle 1 Lonsurf  10/14/2021 10/28/2021 Avastin  every 2 weeks Cycle 2 Lonsurf  11/11/2021 Cycle 3 Lonsurf  12/09/2021 Cycle 4 Lonsurf  01/06/2022 Avastin  held 01/20/2022 due to proteinuria, 24-hour urine 43 mg CT/pelvis 01/30/2022-possible new peritoneal implant at the transverse colon, no significant change in  other omental/peritoneal implants, stable chronic rectal wall thickening, status post removal of double-J left ureteral stent with no evidence of hydronephrosis or ureteral calculus, nonobstructing bilateral renal calculi Cycle 5 Lonsurf  02/05/2022 or 02/06/2022 Avastin  every 2 weeks Cycle 6 Lonsurf  03/03/2022 CTs 04/05/2022-increase in size and number of peritoneal implants compared to January 2024 central liver mass narrowing the anterior branch of the right portal vein, decreased biliary duct dilation, biliary stents in place, stable right cardiophrenic lymph nodes, separate pigtail stent in the lumen of the proximal duodenum Cycle 1 FOLFOX 04/14/2022 CT abdomen/pelvis 04/16/2022-stent within the third portion of the duodenum, stable peritoneal nodules, indwelling biliary stents with mild left biliary duct dilation Cycle 2 FOLFOX 04/28/2022, Emend and prophylactic dexamethasone  added Cycle 3 FOLFOX 05/12/2022, Aloxi , Emend, prophylactic dexamethasone , Compazine  and lorazepam  as needed Chemotherapy held 05/25/2020 due to severe hypertension, evaluated in the emergency department Cycle 4 FOLFOX 06/03/2022 Cycle 5 FOLFOX 06/16/2022, oxaliplatin  dose reduced and 5-FU bolus eliminated due to neutropenia CT 06/25/2022-unchanged soft tissue at the hepatic hilum, unchanged, bile duct stent, decreased size of peritoneal and omental nodules and diminished volume of likely fluid in the lower left pelvis Cycle 6 FOLFOX 06/30/2022 Cycle 7 FOLFOX 07/21/2022 CT 07/24/2022 (in the emergency department with nausea, headache, abdominal pain)-no acute pathology.  No significant interval change in omental nodularity. Cycle 8 FOLFOX 08/04/2022, pruritus on the palms at the end of the oxaliplatin  infusion, Pepcid  and Benadryl  given, symptoms resolved, infusion not resumed Cycle 9 FOLFOX 08/18/2022-Pepcid  and Benadryl  added to premedications, Oxaliplatin  diluted in a larger volume and infusion time increased Cycle 10 FOLFOX  09/08/2022 Cycle 11 FOLFOX 09/22/2022 10/06/2022 patient declined treatment CTs 10/15/2022-similar soft tissue fullness in the hepatic hilum with biliary stents in place, decrease in omental/peritoneal metastases Maintenance capecitabine  10/26/2022 CT abdomen/pelvis 11/19/2022-mild increase in tumor studding at the left paracolic gutter and omentum, circumferential wall thickening of the rectum, biliary stents in place with minimal increase in intrahepatic biliary dilation, nonobstructive renal calculi Colonoscopy 11/23/2022-anastomosis at 15 cm from the anal verge-patent, 2 cm polypoid tissue at the anastomosis biopsied, invasive adenocarcinoma moderately differentiated, preserved expression of major MMR proteins CTs 12/15/2022: Possible developing nodule in the lateral left liver, increased omental nodularity, persistent wall thickening in the upper rectum CT abdomen/pelvis 12/24/2022: Increased thickened fold in the transverse colon-infectious/inflammatory, 2.1 cm lateral left liver lesion, potential  new 5 mm segment 5 lesion, stable biliary stents with intrahepatic biliary dilation, central biliary wall thickening near the hepatic hilum, multiple peritoneal/omental implants unchanged from 12/15/2022, but likely larger than 11/19/2022 Cycle 1 irinotecan /Panitumumab  01/06/2023 Cycle 2 irinotecan /Panitumumab  01/20/2023 CTs 01/24/2023-slight increase in omental nodularity. Cycle 3 irinotecan /Panitumumab  02/03/2023 Cycle 4 irinotecan /panitumumab  02/17/2023 CTs abdomen/pelvis 02/24/2023-subtly hypoattenuating lesion within the peripheral aspect of hepatic segment 2 redemonstrated.  Additional scattered subcentimeter low-density lesions within the liver too small to characterize.  Prominent intrahepatic biliary dilatation with biliary stents in place.  Similar degree of nodularity along the omentum and left paracolic gutter.  No ascites. CTs 03/09/2023-indwelling endoscopically placed stents; level of intrahepatic  biliary duct dilatation is slightly improved on the left and some on the right.  Gallbladder is dilated with wall edema which is increasing.  More nodular tissue extending up along the porta hepatis.  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  Rectum with nodular wall thickening with extension into the adjacent tissue, nodular areas seen extending perirectal and presacral spaces.  Peritoneal carcinomatosis again identified. Irinotecan /Panitumumab  04/02/2023 Irinotecan /panitumumab  04/16/2023 Irinotecan /Panitumumab  05/07/2023 Irinotecan /Panitumumab  06/19/2023 CT abdomen/pelvis 06/21/2023-stable heterogeneous enhancement throughout the liver with nodularity along the surface of the liver.  Small amount of perihepatic ascites.  Stable nodularity right abdomen.  Circumferential rectal wall thickening/mass unchanged. Irinotecan /Panitumumab  discontinued 07/10/2023 due to lack of clinical benefit/poor tolerance Appointment with Dr. Sheilda 07/23/2023 CTs 08/02/2023-mid rectal soft tissue mass, mildly progressive.  Right hepatic metastases, progressive.  Peritoneal disease/omental caking stable versus mildly progressive.  Small volume abdominopelvic ascites mildly progressive. Cycle 1 FOLFOX 08/05/2023 Cycle 2 FOLFOX 08/20/2023 (Pepcid  and Benadryl  daily x 2 days before treatment, Pepcid  continued for 2 days post oxaliplatin ; oxaliplatin  administration time increased) Cycle 3 FOLFOX 09/17/2023 (Pepcid  and Benadryl  daily x 2 days before treatment, Pepcid  continued for 2 days post oxaliplatin ; oxaliplatin  administration time increased)   Hypertension G4 P3, twins Kidney stones-bilateral staghorn renal calculi on CT 08/10/2020 Infected Port-A-Cath 09/12/2019-placed on Augmentin , referred for Port-A-Cath removal; Port-A-Cath removed 09/14/2019; PICC line placed 09/14/2019; culture staph aureus.  Course of Septra  completed. Neutropenia secondary to chemotherapy-Udenyca  added with cycle 8 FOLFIRI Right nephrostomy tube  09/12/2020; stent placed 10/09/2020, percutaneous nephrostolithotomy treatment of right-sided kidney stones 10/09/2020, malpositioned right ureter stent replacement 10/23/2020, right ureter stent removed 10/29/2020 Percutaneous left nephrostolithotomy 10/01/2021-no renal stone identified, left ureter stent left in place Cystoscopy 10/14/2021-stent removed 8.  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  2 moderate biliary strictures found in the hepatic duct system, malignant appearing.  Left and right hepatic duct and all intrahepatic branches were moderately dilated secondary to the strictures.  The strictures were dilated.  Plastic stent placed into the left hepatic duct and right hepatic duct. 9.  Admission 04/16/2022 with nausea/vomiting and abdominal pain, felt to be acute toxicity related to chemotherapy, discharged home 04/18/2022 10.  Admission 11/19/2022 with nausea/vomiting and abdominal pain-improved 11.  Admission 12/15/2022 with fever and right submandibular swelling/tenderness CT neck 12/15/2022: 2 mm distal right submandibular duct calculus with mild inflammatory change adjacent  to the right submandibular gland 12.  Admission 12/24/2022 with a fever nausea/vomiting, diarrhea, Klebsiella bacteremia completed course of Augmentin  13.  Admission with abdominal pain and nausea/vomiting 03/09/2023 through 03/14/2023-concern  for gallbladder inflammation.  Not felt to be a surgical candidate.  Percutaneous cholecystostomy tube placed 03/11/2023.  Tube removed 04/24/2023. 14.  Admission 06/20/2023 with abdominal pain and constipation, pain improved partially following a bowel movement  Disposition: April [name] appears stable.  She has completed 2 cycles of FOLFOX.  Overall tolerating chemotherapy well.  Plan to proceed with cycle 3 as scheduled 09/17/2023.  CBC and chemistry panel reviewed.  Labs are adequate for treatment.  Persistent hypokalemia.  She declines IV potassium today.  She will continue to try to take oral potassium as prescribed.  We will make arrangements for IV potassium when she is here tomorrow for treatment.  Will add magnesium  level.  She will return for follow-up in 2 weeks.  We are available to see her sooner if needed.  Olam Ned ANP/GNP-BC   09/16/2023  2:01 PM

## 2023-09-16 NOTE — Addendum Note (Signed)
 Addended by: DEBBY OLAM POUR on: 09/16/2023 04:26 PM   Modules accepted: Orders

## 2023-09-16 NOTE — Progress Notes (Signed)
 CRITICAL VALUE STICKER  CRITICAL VALUE: K+ 2.7  RECEIVER (on-site recipient of call):Carvell Hoeffner,RN  DATE & TIME NOTIFIED: 09/16/23 @ 1405  MESSENGER (representative from lab):James  MD NOTIFIED: Olam Ned, NP  TIME OF NOTIFICATION:1406  RESPONSE: Being seen in office

## 2023-09-17 ENCOUNTER — Ambulatory Visit: Admitting: Oncology

## 2023-09-17 ENCOUNTER — Ambulatory Visit

## 2023-09-17 ENCOUNTER — Other Ambulatory Visit

## 2023-09-17 ENCOUNTER — Telehealth: Payer: Self-pay

## 2023-09-17 ENCOUNTER — Telehealth: Payer: Self-pay | Admitting: Dietician

## 2023-09-17 ENCOUNTER — Inpatient Hospital Stay

## 2023-09-17 NOTE — Telephone Encounter (Signed)
 Patient screened on MST. Second attempt to reach since previous nutrition consult. Mobile# is no longer in service, home# is mother's#s.  Unable to leave voice mail, provided my cell# and availability in text to have patient return call to set up a remote nutrition consult.  Micheline Craven, RDN, LDN Registered Dietitian, [facility] Part Time Remote (Usual office hours: Tuesday-Thursday) Cell: [phone]

## 2023-09-17 NOTE — Telephone Encounter (Signed)
 Attempted to call pt to discuss intrathecal pain pump, no answer, unable to LVM

## 2023-09-18 ENCOUNTER — Telehealth: Payer: Self-pay | Admitting: Oncology

## 2023-09-18 ENCOUNTER — Other Ambulatory Visit (HOSPITAL_COMMUNITY): Payer: Self-pay

## 2023-09-18 ENCOUNTER — Other Ambulatory Visit: Payer: Self-pay | Admitting: Nurse Practitioner

## 2023-09-18 MED ORDER — HYDROMORPHONE HCL 8 MG PO TABS
8.0000 mg | ORAL_TABLET | ORAL | 0 refills | Status: DC | PRN
  Filled 2023-09-19: qty 100, 13d supply, fill #0
  Filled 2023-09-25: qty 100, 17d supply, fill #0

## 2023-09-18 NOTE — Telephone Encounter (Signed)
 Tried to call PT to let PT know about scheduled appts. NO answer, VM not set up

## 2023-09-19 ENCOUNTER — Other Ambulatory Visit (HOSPITAL_COMMUNITY): Payer: Self-pay

## 2023-09-19 ENCOUNTER — Encounter: Payer: Self-pay | Admitting: Oncology

## 2023-09-19 ENCOUNTER — Encounter

## 2023-09-20 ENCOUNTER — Emergency Department (HOSPITAL_COMMUNITY)

## 2023-09-20 ENCOUNTER — Encounter (HOSPITAL_COMMUNITY): Payer: Self-pay

## 2023-09-20 ENCOUNTER — Other Ambulatory Visit: Payer: Self-pay

## 2023-09-20 ENCOUNTER — Inpatient Hospital Stay (HOSPITAL_COMMUNITY)
Admission: EM | Admit: 2023-09-20 | Discharge: 2023-09-25 | DRG: 948 | Disposition: A | Discharge status: Home / Self Care | Attending: Family Medicine | Admitting: Family Medicine

## 2023-09-20 DIAGNOSIS — Z87891 Personal history of nicotine dependence: Secondary | ICD-10-CM

## 2023-09-20 DIAGNOSIS — Z515 Encounter for palliative care: Secondary | ICD-10-CM

## 2023-09-20 DIAGNOSIS — E876 Hypokalemia: Secondary | ICD-10-CM | POA: Diagnosis present

## 2023-09-20 DIAGNOSIS — G893 Neoplasm related pain (acute) (chronic): Secondary | ICD-10-CM | POA: Diagnosis not present

## 2023-09-20 DIAGNOSIS — Z888 Allergy status to other drugs, medicaments and biological substances status: Secondary | ICD-10-CM

## 2023-09-20 DIAGNOSIS — R112 Nausea with vomiting, unspecified: Secondary | ICD-10-CM | POA: Diagnosis not present

## 2023-09-20 DIAGNOSIS — E860 Dehydration: Secondary | ICD-10-CM | POA: Diagnosis present

## 2023-09-20 DIAGNOSIS — E861 Hypovolemia: Secondary | ICD-10-CM | POA: Diagnosis present

## 2023-09-20 DIAGNOSIS — N132 Hydronephrosis with renal and ureteral calculous obstruction: Secondary | ICD-10-CM | POA: Diagnosis present

## 2023-09-20 DIAGNOSIS — C19 Malignant neoplasm of rectosigmoid junction: Secondary | ICD-10-CM | POA: Diagnosis not present

## 2023-09-20 DIAGNOSIS — R188 Other ascites: Secondary | ICD-10-CM | POA: Diagnosis present

## 2023-09-20 DIAGNOSIS — Z6820 Body mass index (BMI) 20.0-20.9, adult: Secondary | ICD-10-CM

## 2023-09-20 DIAGNOSIS — Z1152 Encounter for screening for COVID-19: Secondary | ICD-10-CM

## 2023-09-20 DIAGNOSIS — E871 Hypo-osmolality and hyponatremia: Secondary | ICD-10-CM | POA: Diagnosis present

## 2023-09-20 DIAGNOSIS — Z79891 Long term (current) use of opiate analgesic: Secondary | ICD-10-CM

## 2023-09-20 DIAGNOSIS — I10 Essential (primary) hypertension: Secondary | ICD-10-CM | POA: Diagnosis present

## 2023-09-20 DIAGNOSIS — Z8249 Family history of ischemic heart disease and other diseases of the circulatory system: Secondary | ICD-10-CM

## 2023-09-20 DIAGNOSIS — R64 Cachexia: Secondary | ICD-10-CM | POA: Diagnosis present

## 2023-09-20 DIAGNOSIS — C786 Secondary malignant neoplasm of retroperitoneum and peritoneum: Secondary | ICD-10-CM | POA: Diagnosis present

## 2023-09-20 DIAGNOSIS — Z823 Family history of stroke: Secondary | ICD-10-CM

## 2023-09-20 DIAGNOSIS — D630 Anemia in neoplastic disease: Secondary | ICD-10-CM | POA: Diagnosis present

## 2023-09-20 DIAGNOSIS — I160 Hypertensive urgency: Secondary | ICD-10-CM | POA: Diagnosis not present

## 2023-09-20 DIAGNOSIS — C787 Secondary malignant neoplasm of liver and intrahepatic bile duct: Secondary | ICD-10-CM | POA: Diagnosis present

## 2023-09-20 DIAGNOSIS — I7121 Aneurysm of the ascending aorta, without rupture: Secondary | ICD-10-CM | POA: Diagnosis present

## 2023-09-20 DIAGNOSIS — K5900 Constipation, unspecified: Secondary | ICD-10-CM | POA: Diagnosis present

## 2023-09-20 DIAGNOSIS — C187 Malignant neoplasm of sigmoid colon: Secondary | ICD-10-CM | POA: Diagnosis present

## 2023-09-20 DIAGNOSIS — Z833 Family history of diabetes mellitus: Secondary | ICD-10-CM

## 2023-09-20 DIAGNOSIS — J449 Chronic obstructive pulmonary disease, unspecified: Secondary | ICD-10-CM | POA: Diagnosis present

## 2023-09-20 DIAGNOSIS — Z79899 Other long term (current) drug therapy: Secondary | ICD-10-CM

## 2023-09-20 DIAGNOSIS — R54 Age-related physical debility: Secondary | ICD-10-CM | POA: Diagnosis present

## 2023-09-20 DIAGNOSIS — Z723 Lack of physical exercise: Secondary | ICD-10-CM

## 2023-09-20 LAB — RESP PANEL BY RT-PCR (RSV, FLU A&B, COVID)  RVPGX2
Influenza A by PCR: NEGATIVE
Influenza B by PCR: NEGATIVE
Resp Syncytial Virus by PCR: NEGATIVE
SARS Coronavirus 2 by RT PCR: NEGATIVE

## 2023-09-20 LAB — CBC WITH DIFFERENTIAL/PLATELET
Abs Immature Granulocytes: 0.05 K/uL (ref 0.00–0.07)
Basophils Absolute: 0 K/uL (ref 0.0–0.1)
Basophils Relative: 0 %
Eosinophils Absolute: 0 K/uL (ref 0.0–0.5)
Eosinophils Relative: 0 %
HCT: 33.6 % — ABNORMAL LOW (ref 36.0–46.0)
Hemoglobin: 10.6 g/dL — ABNORMAL LOW (ref 12.0–15.0)
Immature Granulocytes: 0 %
Lymphocytes Relative: 14 %
Lymphs Abs: 1.8 K/uL (ref 0.7–4.0)
MCH: 27.2 pg (ref 26.0–34.0)
MCHC: 31.5 g/dL (ref 30.0–36.0)
MCV: 86.2 fL (ref 80.0–100.0)
Monocytes Absolute: 0.9 K/uL (ref 0.1–1.0)
Monocytes Relative: 7 %
Neutro Abs: 9.6 K/uL — ABNORMAL HIGH (ref 1.7–7.7)
Neutrophils Relative %: 79 %
Platelets: 365 K/uL (ref 150–400)
RBC: 3.9 MIL/uL (ref 3.87–5.11)
RDW: 17.3 % — ABNORMAL HIGH (ref 11.5–15.5)
WBC: 12.3 K/uL — ABNORMAL HIGH (ref 4.0–10.5)
nRBC: 0 % (ref 0.0–0.2)

## 2023-09-20 LAB — URINALYSIS, W/ REFLEX TO CULTURE (INFECTION SUSPECTED)
Bacteria, UA: NONE SEEN
Bilirubin Urine: NEGATIVE
Glucose, UA: NEGATIVE mg/dL
Ketones, ur: 20 mg/dL — AB
Leukocytes,Ua: NEGATIVE
Nitrite: NEGATIVE
Protein, ur: 30 mg/dL — AB
Specific Gravity, Urine: 1.028 (ref 1.005–1.030)
pH: 6 (ref 5.0–8.0)

## 2023-09-20 LAB — COMPREHENSIVE METABOLIC PANEL WITH GFR
ALT: 23 U/L (ref 0–44)
AST: 50 U/L — ABNORMAL HIGH (ref 15–41)
Albumin: 3.4 g/dL — ABNORMAL LOW (ref 3.5–5.0)
Alkaline Phosphatase: 624 U/L — ABNORMAL HIGH (ref 38–126)
Anion gap: 16 — ABNORMAL HIGH (ref 5–15)
BUN: 5 mg/dL — ABNORMAL LOW (ref 6–20)
CO2: 27 mmol/L (ref 22–32)
Calcium: 9.2 mg/dL (ref 8.9–10.3)
Chloride: 87 mmol/L — ABNORMAL LOW (ref 98–111)
Creatinine, Ser: 0.4 mg/dL — ABNORMAL LOW (ref 0.44–1.00)
GFR, Estimated: >60 mL/min (ref >60)
Glucose, Bld: 73 mg/dL (ref 70–99)
Potassium: 2.7 mmol/L — CL (ref 3.5–5.1)
Sodium: 131 mmol/L — ABNORMAL LOW (ref 135–145)
Total Bilirubin: 0.9 mg/dL (ref 0.0–1.2)
Total Protein: 8.9 g/dL — ABNORMAL HIGH (ref 6.5–8.1)

## 2023-09-20 LAB — PROTIME-INR
INR: 0.9 (ref 0.8–1.2)
Prothrombin Time: 13 s (ref 11.4–15.2)

## 2023-09-20 LAB — I-STAT CG4 LACTIC ACID, ED: Lactic Acid, Venous: 0.7 mmol/L (ref 0.5–1.9)

## 2023-09-20 MED ORDER — POLYETHYLENE GLYCOL 3350 17 G PO PACK: 17.0000 g | PACK | Freq: Every day | ORAL | Status: DC | PRN

## 2023-09-20 MED ORDER — NALOXONE HCL 0.4 MG/ML IJ SOLN: 0.4000 mg | INTRAMUSCULAR | Status: DC | PRN

## 2023-09-20 MED ORDER — FENTANYL 50 MCG/HR TD PT72
1.0000 | MEDICATED_PATCH | TRANSDERMAL | Status: DC
  Administered 2023-09-20: 1 via TRANSDERMAL
  Filled 2023-09-20: qty 1

## 2023-09-20 MED ORDER — DIPHENHYDRAMINE HCL 50 MG/ML IJ SOLN
12.5000 mg | Freq: Once | INTRAMUSCULAR | Status: AC
  Administered 2023-09-20: 12.5 mg via INTRAVENOUS
  Filled 2023-09-20: qty 1

## 2023-09-20 MED ORDER — AMLODIPINE BESYLATE 5 MG PO TABS
10.0000 mg | ORAL_TABLET | Freq: Every day | ORAL | Status: DC
  Administered 2023-09-21 – 2023-09-25 (×5): 10 mg via ORAL
  Filled 2023-09-20 (×5): qty 2

## 2023-09-20 MED ORDER — DIPHENHYDRAMINE HCL 12.5 MG/5ML PO ELIX: 12.5000 mg | ORAL_SOLUTION | Freq: Four times a day (QID) | ORAL | Status: DC | PRN

## 2023-09-20 MED ORDER — LACTATED RINGERS IV BOLUS (SEPSIS)
1000.0000 mL | Freq: Once | INTRAVENOUS | Status: AC
  Administered 2023-09-20: 1000 mL via INTRAVENOUS

## 2023-09-20 MED ORDER — POTASSIUM CHLORIDE 10 MEQ/100ML IV SOLN
10.0000 meq | Freq: Once | INTRAVENOUS | Status: AC
  Administered 2023-09-20: 10 meq via INTRAVENOUS
  Filled 2023-09-20: qty 100

## 2023-09-20 MED ORDER — PROCHLORPERAZINE EDISYLATE 10 MG/2ML IJ SOLN
5.0000 mg | INTRAMUSCULAR | Status: DC | PRN
  Administered 2023-09-20 – 2023-09-23 (×3): 5 mg via INTRAVENOUS
  Filled 2023-09-20 (×3): qty 2

## 2023-09-20 MED ORDER — ENOXAPARIN SODIUM 40 MG/0.4ML IJ SOSY
40.0000 mg | PREFILLED_SYRINGE | INTRAMUSCULAR | Status: DC
  Filled 2023-09-20 (×2): qty 0.4

## 2023-09-20 MED ORDER — HYDROMORPHONE HCL 1 MG/ML IJ SOLN: 1.0000 mg | INTRAMUSCULAR | Status: DC | PRN

## 2023-09-20 MED ORDER — PROMETHAZINE (PHENERGAN) 6.25MG IN NS 50ML IVPB
6.2500 mg | Freq: Four times a day (QID) | INTRAVENOUS | Status: DC | PRN
  Administered 2023-09-23: 6.25 mg via INTRAVENOUS
  Filled 2023-09-20: qty 6.25

## 2023-09-20 MED ORDER — BISACODYL 5 MG PO TBEC: 5.0000 mg | DELAYED_RELEASE_TABLET | Freq: Every day | ORAL | Status: DC | PRN

## 2023-09-20 MED ORDER — SODIUM CHLORIDE 0.9% FLUSH: 9.0000 mL | INTRAVENOUS | Status: DC | PRN

## 2023-09-20 MED ORDER — LABETALOL HCL 5 MG/ML IV SOLN
10.0000 mg | INTRAVENOUS | Status: DC | PRN
  Administered 2023-09-20 – 2023-09-25 (×2): 10 mg via INTRAVENOUS
  Filled 2023-09-20 (×3): qty 4

## 2023-09-20 MED ORDER — MORPHINE SULFATE (PF) 4 MG/ML IV SOLN
4.0000 mg | Freq: Once | INTRAVENOUS | Status: AC
  Administered 2023-09-20: 4 mg via INTRAVENOUS
  Filled 2023-09-20: qty 1

## 2023-09-20 MED ORDER — HYDRALAZINE HCL 20 MG/ML IJ SOLN
10.0000 mg | Freq: Once | INTRAMUSCULAR | Status: AC
  Administered 2023-09-20: 10 mg via INTRAVENOUS
  Filled 2023-09-20: qty 1

## 2023-09-20 MED ORDER — SODIUM CHLORIDE 0.9% FLUSH
3.0000 mL | Freq: Two times a day (BID) | INTRAVENOUS | Status: DC
  Administered 2023-09-20 – 2023-09-25 (×6): 3 mL via INTRAVENOUS

## 2023-09-20 MED ORDER — FENTANYL 50 MCG/HR TD PT72: 1.0000 | MEDICATED_PATCH | TRANSDERMAL | Status: DC

## 2023-09-20 MED ORDER — ONDANSETRON HCL 4 MG/2ML IJ SOLN
4.0000 mg | Freq: Once | INTRAMUSCULAR | Status: AC
  Administered 2023-09-20: 4 mg via INTRAVENOUS
  Filled 2023-09-20: qty 2

## 2023-09-20 MED ORDER — HYDROMORPHONE 1 MG/ML IV SOLN
INTRAVENOUS | Status: DC
  Administered 2023-09-20: 30 mg via INTRAVENOUS
  Administered 2023-09-20: 3 mg via INTRAVENOUS
  Administered 2023-09-21 (×2): 3.6 mg via INTRAVENOUS
  Administered 2023-09-21: 30 mg via INTRAVENOUS
  Administered 2023-09-21: 7.2 mg via INTRAVENOUS
  Administered 2023-09-21: 3 mg via INTRAVENOUS
  Administered 2023-09-21: 5.4 mg via INTRAVENOUS
  Administered 2023-09-22: 30 mg via INTRAVENOUS
  Administered 2023-09-22: 3.6 mg via INTRAVENOUS
  Administered 2023-09-22: 6.6 mg via INTRAVENOUS
  Administered 2023-09-22 (×3): 3.6 mg via INTRAVENOUS
  Administered 2023-09-22: 7.8 mg via INTRAVENOUS
  Administered 2023-09-23: 9 mg via INTRAVENOUS
  Administered 2023-09-23: 7.2 mg via INTRAVENOUS
  Administered 2023-09-23: 2.4 mg via INTRAVENOUS
  Administered 2023-09-23: 30 mg via INTRAVENOUS
  Administered 2023-09-24: 4.2 mg via INTRAVENOUS
  Administered 2023-09-24: 2.4 mg via INTRAVENOUS
  Administered 2023-09-24: 3 mg via INTRAVENOUS
  Filled 2023-09-20 (×4): qty 30

## 2023-09-20 MED ORDER — ACETAMINOPHEN 650 MG RE SUPP: 650.0000 mg | Freq: Four times a day (QID) | RECTAL | Status: DC | PRN

## 2023-09-20 MED ORDER — POTASSIUM CHLORIDE IN NACL 40-0.9 MEQ/L-% IV SOLN
INTRAVENOUS | Status: DC
  Filled 2023-09-20 (×5): qty 1000

## 2023-09-20 MED ORDER — HYDROMORPHONE HCL 1 MG/ML IJ SOLN
1.0000 mg | Freq: Once | INTRAMUSCULAR | Status: AC
  Administered 2023-09-20: 1 mg via INTRAVENOUS
  Filled 2023-09-20: qty 1

## 2023-09-20 MED ORDER — IOHEXOL 300 MG/ML  SOLN
100.0000 mL | Freq: Once | INTRAMUSCULAR | Status: AC | PRN
  Administered 2023-09-20: 100 mL via INTRAVENOUS

## 2023-09-20 MED ORDER — SODIUM CHLORIDE 0.9 % IV SOLN
12.5000 mg | Freq: Once | INTRAVENOUS | Status: AC
  Administered 2023-09-20: 12.5 mg via INTRAVENOUS
  Filled 2023-09-20: qty 0.5

## 2023-09-20 MED ORDER — DIPHENHYDRAMINE HCL 50 MG/ML IJ SOLN: 12.5000 mg | Freq: Four times a day (QID) | INTRAMUSCULAR | Status: DC | PRN

## 2023-09-20 MED ORDER — SENNA 8.6 MG PO TABS
1.0000 | ORAL_TABLET | Freq: Two times a day (BID) | ORAL | Status: DC
  Administered 2023-09-21: 8.6 mg via ORAL
  Filled 2023-09-20 (×2): qty 1

## 2023-09-20 MED ORDER — ACETAMINOPHEN 325 MG PO TABS: 650.0000 mg | ORAL_TABLET | Freq: Four times a day (QID) | ORAL | Status: DC | PRN

## 2023-09-20 NOTE — H&P (Signed)
 History and Physical    April [name] FMW:[id number] DOB: 11-Aug-1975 DOA: 09/20/2023  PCP: Oris Camie BRAVO, NP   Patient coming from: Home   Chief Complaint: Abdominal pain, N/V   HPI: April [name] is a 48 y.o. female with medical history significant for stage IV colorectal cancer with metastases, hypertension, and cancer related chronic pain who presents with increased abdominal pain, nausea, and vomiting.  Patient is undergoing treatment for metastatic colon cancer but missed cycle 3 of FOLFOX on 09/17/2023 due to worsening in her chronic abdominal pain along with nausea and vomiting.  Patient states that she has been unable to take any oral medications or hold any food down since 09/17/2023 due to her severe N/V.  Her abdominal pain is the same in location and character as her chronic pain but severity has increased.  She is wearing a fentanyl  patch, forgot when she put it on, but believes that it has been more than 3 days.  She reports being unable to afford more fentanyl  patches and has been unable to tolerate her oral Dilaudid  at home due to the vomiting.  ED Course: Upon arrival to the ED, patient is found to be afebrile and saturating well on room air with normal HR and severely elevated BP.  Labs are most notable for potassium 2.7, normal creatinine, alkaline phosphatase 624, WBC 12,300, and normal lactic acid.  CT of the abdomen and pelvis demonstrates new mild left hydroureteronephrosis without clear etiology and suspected increase in right lower lobe metastatic disease in addition to other stable findings.  Blood cultures were collected in the ED and the patient was given a liter of LR, IV potassium, Zofran , Benadryl , Phenergan , morphine , and 2 doses of Dilaudid .  She continues to have uncontrolled pain and is unable to tolerate anything by mouth at this time.  Review of Systems:  All other systems reviewed and apart from HPI, are negative.  Past Medical  History:  Diagnosis Date   Ascending aortic aneurysm (HCC) 12/16/2022   Benign essential hypertension 10/06/2017   Last Assessment & Plan:  Formatting of this note might be different from the original. Patient's blood pressure noted to be elevated at today's visit 188/98.  Patient will be provided with antihypertensives in the treatment center if needed in order to meet her Avastin  parameters.  Patient recommended to follow up with her primary care physician regarding medication management.  Patient is not cur   Cancer [facility])    Change in bowel habits 06/28/2022   Colon cancer (HCC)    History of colon cancer 06/28/2022   Hypertension    Prolonged QT interval 01/07/2018   Renal calculi    bilateral   Sepsis due to undetermined organism (HCC) 12/24/2022   Sialoadenitis of submandibular gland 12/16/2022    Past Surgical History:  Procedure Laterality Date   BALLOON DILATION N/A 02/26/2023   Procedure: BALLOON DILATION;  Surgeon: Wilhelmenia Aloha Raddle., MD;  Location: [facility];  Service: Gastroenterology;  Laterality: N/A;   BILIARY BRUSHING  03/24/2022   Procedure: BILIARY BRUSHING;  Surgeon: Wilhelmenia Aloha Raddle., MD;  Location: [facility] ENDOSCOPY;  Service: Gastroenterology;;   BILIARY DILATION  03/24/2022   Procedure: BILIARY DILATION;  Surgeon: Wilhelmenia Aloha Raddle., MD;  Location: [facility] ENDOSCOPY;  Service: Gastroenterology;;   BILIARY STENT PLACEMENT  03/24/2022   Procedure: BILIARY STENT PLACEMENT;  Surgeon: Wilhelmenia Aloha Raddle., MD;  Location: [facility] ENDOSCOPY;  Service: Gastroenterology;;   BILIARY STENT PLACEMENT N/A 02/26/2023   Procedure: BILIARY STENT PLACEMENT;  Surgeon: Wilhelmenia Aloha Raddle., MD;  Location: [facility];  Service: Gastroenterology;  Laterality: N/A;   BIOPSY  03/24/2022   Procedure: BIOPSY;  Surgeon: Wilhelmenia Aloha Raddle., MD;  Location: [facility] ENDOSCOPY;  Service: Gastroenterology;;   BIOPSY  11/23/2022   Procedure: BIOPSY;  Surgeon: Abran Norleen SAILOR, MD;   Location: [facility];  Service: Gastroenterology;;   BIOPSY  02/26/2023   Procedure: BIOPSY;  Surgeon: Wilhelmenia Aloha Raddle., MD;  Location: [facility];  Service: Gastroenterology;;   CESAREAN SECTION     x2   COLONOSCOPY N/A 11/23/2022   Procedure: COLONOSCOPY;  Surgeon: Abran Norleen SAILOR, MD;  Location: [facility];  Service: Gastroenterology;  Laterality: N/A;   CYSTOSCOPY WITH RETROGRADE PYELOGRAM, URETEROSCOPY AND STENT PLACEMENT Right 10/22/2020   Procedure: CYSTOSCOPY WITH RETROGRADE PYELOGRAM, URETEROSCOPY AND STENT PLACEMENT;  Surgeon: Elisabeth Valli BIRCH, MD;  Location: [facility];  Service: Urology;  Laterality: Right;   ENDOSCOPIC RETROGRADE CHOLANGIOPANCREATOGRAPHY (ERCP) WITH PROPOFOL  N/A 02/26/2023   Procedure: ENDOSCOPIC RETROGRADE CHOLANGIOPANCREATOGRAPHY (ERCP) WITH PROPOFOL ;  Surgeon: Wilhelmenia Aloha Raddle., MD;  Location: [facility];  Service: Gastroenterology;  Laterality: N/A;   ERCP N/A 03/24/2022   Procedure: ENDOSCOPIC RETROGRADE CHOLANGIOPANCREATOGRAPHY (ERCP);  Surgeon: Wilhelmenia Aloha Raddle., MD;  Location: [facility] ENDOSCOPY;  Service: Gastroenterology;  Laterality: N/A;   ESOPHAGOGASTRODUODENOSCOPY (EGD) WITH PROPOFOL  N/A 02/26/2023   Procedure: ESOPHAGOGASTRODUODENOSCOPY (EGD) WITH PROPOFOL ;  Surgeon: Wilhelmenia Aloha Raddle., MD;  Location: [facility];  Service: Gastroenterology;  Laterality: N/A;  patient needs EGD in addition to ERCP   IR PATIENT EVAL TECH 0-60 MINS  12/15/2022   IR PERC CHOLECYSTOSTOMY  03/11/2023   IR RADIOLOGIST EVAL & MGMT  09/16/2019   IR RADIOLOGIST EVAL & MGMT  09/23/2019   IR RADIOLOGIST EVAL & MGMT  09/20/2019   IR RADIOLOGIST EVAL & MGMT  09/30/2019   IR RADIOLOGIST EVAL & MGMT  04/20/2023   IR REMOVAL TUN ACCESS W/ PORT W/O FL MOD SED  09/14/2019   IR VENO/EXT/UNI RIGHT  04/11/2022   PANCREATIC STENT PLACEMENT  03/24/2022   Procedure: PANCREATIC STENT PLACEMENT;  Surgeon: Wilhelmenia Aloha Raddle., MD;  Location: [facility];  Service:  Gastroenterology;;   REMOVAL OF STONES  03/24/2022   Procedure: REMOVAL OF STONES;  Surgeon: Wilhelmenia Aloha Raddle., MD;  Location: [facility] ENDOSCOPY;  Service: Gastroenterology;;   REMOVAL OF STONES  02/26/2023   Procedure: REMOVAL OF Sludge;  Surgeon: Wilhelmenia Aloha Raddle., MD;  Location: [facility];  Service: Gastroenterology;;   ANNETT  03/24/2022   Procedure: ANNETT;  Surgeon: Wilhelmenia Aloha Raddle., MD;  Location: [facility] ENDOSCOPY;  Service: Gastroenterology;;   CLEDA REMOVAL  02/26/2023   Procedure: STENT REMOVAL;  Surgeon: Wilhelmenia Aloha Raddle., MD;  Location: [facility];  Service: Gastroenterology;;   URETERAL STENT PLACEMENT      Social History:   reports that she has quit smoking. Her smoking use included cigarettes. She has never used smokeless tobacco. She reports that she does not currently use alcohol. She reports that she does not currently use drugs after having used the following drugs: Marijuana.  Allergies  Allergen Reactions   Lisinopril Swelling and Other (See Comments)    Lip swelling and Angioedema   Irinotecan  Other (See Comments)    Stomach cramping Patient given 0.5 mL atropine  IV and was able to continue infusion.  See Progress note on 10/03/20.    Leucovorin  Calcium  Itching and Other (See Comments)    Patient reported itchy palms towards end of transfusion, see progress note 08/04/22.    Oxaliplatin  Itching  and Other (See Comments)    Patient reported itchy palms towards end of transfusion, see progress note on 08/04/22.    Family History  Problem Relation Age of Onset   Hypertension Mother    Diabetes Mellitus II Father    Stroke Father    Ulcers Sister    Colon cancer Neg Hx    Esophageal cancer Neg Hx    Inflammatory bowel disease Neg Hx    Liver disease Neg Hx    Pancreatic cancer Neg Hx    Rectal cancer Neg Hx    Stomach cancer Neg Hx      Prior to Admission medications   Medication Sig Start Date End Date Taking? Authorizing  Provider  acetaminophen  (TYLENOL ) 500 MG tablet Take 500 mg by mouth in the morning, at noon, and at bedtime. Patient taking differently: Take 500 mg by mouth 2 (two) times daily.    [provider]  alum & mag hydroxide-simeth (MAALOX/MYLANTA) 200-200-20 MG/5ML suspension Take 30 mLs by mouth every 6 (six) hours as needed for indigestion or heartburn. Patient not taking: Reported on 09/16/2023 06/26/23   Pokhrel, Laxman, MD  amLODipine  (NORVASC ) 10 MG tablet Take 1 tablet (10 mg total) by mouth daily. 06/26/23   Pokhrel, Laxman, MD  cloNIDine  (CATAPRES ) 0.1 MG tablet Take 3 tablets (0.3 mg total) by mouth 3 (three) times daily. Patient not taking: Reported on 09/16/2023 06/09/23   Willette Adriana LABOR, MD  dexamethasone  (DECADRON ) 2 MG tablet Take 1 tablet (2 mg total) by mouth daily. 08/13/23   Patsy Lenis, MD  dicyclomine  (BENTYL ) 20 MG tablet Take 1 tablet (20 mg total) by mouth 3 (three) times daily as needed for spasms (rectal/AB pain). 02/17/23   Cloretta Arley NOVAK, MD  docusate sodium  (COLACE) 100 MG capsule Take 1 capsule (100 mg total) by mouth 2 (two) times daily. 08/17/23   Cloretta Arley NOVAK, MD  famotidine  (PEPCID ) 20 MG tablet Take 1 tablet (20 mg total) once daily x 2 days prior to oxaliplatin  and daily x 2 days after oxaliplatin  dose. Also take Benadryl  25 mg (OTC) daily x 2 days prior to oxaliplatin . 08/19/23   Debby Olam POUR, NP  fentaNYL  (DURAGESIC ) 50 MCG/HR Place 1 patch onto the skin every 3 (three) days. 09/07/23   Pickenpack-Cousar, Athena N, NP  HYDROmorphone  (DILAUDID ) 8 MG tablet Take 1 tablet (8 mg total) by mouth every 3 (three) hours as needed for moderate pain (pain score 4-6) or severe pain (pain score 7-10). 09/19/23   Pickenpack-Cousar, Fannie SAILOR, NP  lactulose  (CHRONULAC ) 10 GM/15ML solution Take 15-30 mLs (10-20 g total) by mouth daily as needed for mild constipation. 07/06/23   Pickenpack-Cousar, Athena N, NP  metoprolol  tartrate (LOPRESSOR ) 100 MG tablet Take 1 tablet (100 mg  total) by mouth 2 (two) times daily. Patient not taking: Reported on 09/16/2023 06/09/23 09/08/23  Willette Adriana LABOR, MD  pantoprazole  (PROTONIX ) 40 MG tablet Take 1 tablet (40 mg total) by mouth daily before breakfast. 02/27/23   Drusilla Sabas RAMAN, MD  polyethylene glycol powder (GLYCOLAX /MIRALAX ) 17 GM/SCOOP powder Take 17 g by mouth daily as needed. 06/09/23   Shahmehdi, Adriana LABOR, MD  potassium chloride  (MICRO-K ) 10 MEQ CR capsule Take 2 capsules (20 mEq total) by mouth 2 (two) times daily. Patient taking differently: Take 30 mEq by mouth 2 (two) times daily. 06/11/23   Burton, April K, NP  prochlorperazine  (COMPAZINE ) 10 MG tablet Take 1 tablet (10 mg total) by mouth every 6 (six) hours as  needed for nausea or vomiting. Patient not taking: Reported on 09/16/2023 07/06/23   Pickenpack-Cousar, Athena N, NP  senna (SENOKOT) 8.6 MG TABS tablet Take 2 tablets (17.2 mg total) by mouth at bedtime. 06/26/23   Pokhrel, Vernal, MD  Sodium Chloride  Flush (NORMAL SALINE FLUSH) 0.9 % SOLN Use 5 mLs by Intracatheter route daily in biliary drain at 6 (six) AM as directed Patient taking differently: 5 mLs by Intracatheter route every 3 (three) days. 03/14/23   [place], Eric J, DO    Physical Exam: Vitals:   09/20/23 1600 09/20/23 1615 09/20/23 1650 09/20/23 1900  BP: (!) 182/129 (!) 194/137  (!) 170/124  Pulse: 94 84  95  Resp: 18 17  16   Temp:      TempSrc:      SpO2: 98% 100%  99%  Weight:   54.3 kg   Height:   5' 4 (1.626 m)     Constitutional: NAD, appears uncomfortable  Eyes: PERTLA, lids and conjunctivae normal ENMT: Mucous membranes are moist. Posterior pharynx clear of any exudate or lesions.   Neck: supple, no masses  Respiratory: no wheezing, no crackles. No accessory muscle use.  Cardiovascular: S1 & S2 heard, regular rate and rhythm. No extremity edema.  Abdomen: Soft, generally tender. Bowel sounds active.  Musculoskeletal: no clubbing / cyanosis. No joint deformity upper and lower extremities.    Skin: no significant rashes, lesions, ulcers. Warm, dry, well-perfused. Neurologic: CN 2-12 grossly intact. Moving all extremities. Alert and oriented.  Psychiatric: Calm. Cooperative.    Labs and Imaging on Admission: I have personally reviewed following labs and imaging studies  CBC: Recent Labs  Lab 09/16/23 1335 09/20/23 1524  WBC 6.8 12.3*  NEUTROABS 3.5 9.6*  HGB 10.5* 10.6*  HCT 32.6* 33.6*  MCV 86.0 86.2  PLT 339 365   Basic Metabolic Panel: Recent Labs  Lab 09/16/23 1335 09/16/23 1626 09/20/23 1524  NA 134*  --  131*  K 2.7*  --  2.7*  CL 93*  --  87*  CO2 30  --  27  GLUCOSE 88  --  73  BUN <5*  --  5*  CREATININE 0.48  --  0.40*  CALCIUM  9.3  --  9.2  MG  --  1.9  --    GFR: Estimated Creatinine Clearance: 72.9 mL/min (A) (by C-G formula based on SCr of 0.4 mg/dL (L)). Liver Function Tests: Recent Labs  Lab 09/16/23 1335 09/20/23 1524  AST 59* 50*  ALT 26 23  ALKPHOS 710* 624*  BILITOT 0.7 0.9  PROT 8.8* 8.9*  ALBUMIN 3.4* 3.4*   No results for input(s): LIPASE, AMYLASE in the last 168 hours. No results for input(s): AMMONIA in the last 168 hours. Coagulation Profile: Recent Labs  Lab 09/20/23 1524  INR 0.9   Cardiac Enzymes: No results for input(s): CKTOTAL, CKMB, CKMBINDEX, TROPONINI in the last 168 hours. BNP (last 3 results) No results for input(s): PROBNP in the last 8760 hours. HbA1C: No results for input(s): HGBA1C in the last 72 hours. CBG: No results for input(s): GLUCAP in the last 168 hours. Lipid Profile: No results for input(s): CHOL, HDL, LDLCALC, TRIG, CHOLHDL, LDLDIRECT in the last 72 hours. Thyroid Function Tests: No results for input(s): TSH, T4TOTAL, FREET4, T3FREE, THYROIDAB in the last 72 hours. Anemia Panel: No results for input(s): VITAMINB12, FOLATE, FERRITIN, TIBC, IRON, RETICCTPCT in the last 72 hours. Urine analysis:    Component Value Date/Time    COLORURINE YELLOW 09/20/2023 1807  APPEARANCEUR CLEAR 09/20/2023 1807   LABSPEC 1.028 09/20/2023 1807   PHURINE 6.0 09/20/2023 1807   GLUCOSEU NEGATIVE 09/20/2023 1807   HGBUR SMALL (A) 09/20/2023 1807   BILIRUBINUR NEGATIVE 09/20/2023 1807   KETONESUR 20 (A) 09/20/2023 1807   PROTEINUR 30 (A) 09/20/2023 1807   NITRITE NEGATIVE 09/20/2023 1807   LEUKOCYTESUR NEGATIVE 09/20/2023 1807   Sepsis Labs: @LABRCNTIP (procalcitonin:4,lacticidven:4) ) Recent Results (from the past 240 hours)  Resp panel by RT-PCR (RSV, Flu A&B, Covid)     Status: None   Collection Time: 09/20/23  3:24 PM   Specimen: Nasal Swab  Result Value Ref Range Status   SARS Coronavirus 2 by RT PCR NEGATIVE NEGATIVE Final    Comment: (NOTE) SARS-CoV-2 target nucleic acids are NOT DETECTED.  The SARS-CoV-2 RNA is generally detectable in upper respiratory specimens during the acute phase of infection. The lowest concentration of SARS-CoV-2 viral copies this assay can detect is 138 copies/mL. A negative result does not preclude SARS-Cov-2 infection and should not be used as the sole basis for treatment or other patient management decisions. A negative result may occur with  improper specimen collection/handling, submission of specimen other than nasopharyngeal swab, presence of viral mutation(s) within the areas targeted by this assay, and inadequate number of viral copies(<138 copies/mL). A negative result must be combined with clinical observations, patient history, and epidemiological information. The expected result is Negative.  Fact Sheet for Patients:  BloggerCourse.com  Fact Sheet for Healthcare Providers:  SeriousBroker.it  This test is no t yet approved or cleared by the [place]  FDA and  has been authorized for detection and/or diagnosis of SARS-CoV-2 by FDA under an Emergency Use Authorization (EUA). This EUA will remain  in effect (meaning this  test can be used) for the duration of the COVID-19 declaration under Section 564(b)(1) of the Act, 21 U.S.C.section 360bbb-3(b)(1), unless the authorization is terminated  or revoked sooner.       Influenza A by PCR NEGATIVE NEGATIVE Final   Influenza B by PCR NEGATIVE NEGATIVE Final    Comment: (NOTE) The Xpert Xpress SARS-CoV-2/FLU/RSV plus assay is intended as an aid in the diagnosis of influenza from Nasopharyngeal swab specimens and should not be used as a sole basis for treatment. Nasal washings and aspirates are unacceptable for Xpert Xpress SARS-CoV-2/FLU/RSV testing.  Fact Sheet for Patients: BloggerCourse.com  Fact Sheet for Healthcare Providers: SeriousBroker.it  This test is not yet approved or cleared by the [place]  FDA and has been authorized for detection and/or diagnosis of SARS-CoV-2 by FDA under an Emergency Use Authorization (EUA). This EUA will remain in effect (meaning this test can be used) for the duration of the COVID-19 declaration under Section 564(b)(1) of the Act, 21 U.S.C. section 360bbb-3(b)(1), unless the authorization is terminated or revoked.     Resp Syncytial Virus by PCR NEGATIVE NEGATIVE Final    Comment: (NOTE) Fact Sheet for Patients: BloggerCourse.com  Fact Sheet for Healthcare Providers: SeriousBroker.it  This test is not yet approved or cleared by the [place]  FDA and has been authorized for detection and/or diagnosis of SARS-CoV-2 by FDA under an Emergency Use Authorization (EUA). This EUA will remain in effect (meaning this test can be used) for the duration of the COVID-19 declaration under Section 564(b)(1) of the Act, 21 U.S.C. section 360bbb-3(b)(1), unless the authorization is terminated or revoked.  Performed at [facility], [address], [place], [place] [address]   Blood Culture (routine  x 2)  Status: None (Preliminary result)   Collection Time: 09/20/23  3:30 PM   Specimen: BLOOD LEFT HAND  Result Value Ref Range Status   Specimen Description   Final    BLOOD LEFT HAND Performed at [facility], [address], [place], [place] [address]    Special Requests   Final    BOTTLES DRAWN AEROBIC AND ANAEROBIC Blood Culture adequate volume Performed at [facility], [address] [place], [place] [address]    Culture PENDING  Incomplete   Report Status PENDING  Incomplete     Radiological Exams on Admission: CT ABDOMEN PELVIS W CONTRAST Result Date: 09/20/2023 CLINICAL DATA:  Abdominal pain, history of metastatic colon cancer, nausea and vomiting EXAM: CT ABDOMEN AND PELVIS WITH CONTRAST TECHNIQUE: Multidetector CT imaging of the abdomen and pelvis was performed using the standard protocol following bolus administration of intravenous contrast. RADIATION DOSE REDUCTION: This exam was performed according to the departmental dose-optimization program which includes automated exposure control, adjustment of the mA and/or kV according to patient size and/or use of iterative reconstruction technique. CONTRAST:  OMNIPAQUE  IOHEXOL  300 MG/ML  SOLN COMPARISON:  08/02/2023 FINDINGS: Lower chest: No acute pleural or parenchymal lung disease. Hepatobiliary: Marked heterogeneity throughout the right lobe liver compatible with known diffuse hepatic metastases. The index lesion within the right lobe liver measured previously is more difficult to measure discretely on this exam, with subjective increase in the number of hypodense lesions consistent with progressive metastases. Persistent indwelling biliary duct stents, with no change in the intrahepatic biliary duct dilation seen on prior study. Pancreas: Unremarkable. No pancreatic ductal dilatation or surrounding inflammatory changes. Spleen: Normal in size without focal abnormality. Adrenals/Urinary Tract: Stable  bilateral nonobstructing renal calculi measuring up to 13 mm on the left and 9 mm on the right. Interval development of mild left hydronephrosis and hydroureter, without clear cause for obstruction identified. The adrenals are unremarkable. Bladder is moderately distended without filling defect. Stomach/Bowel: No bowel obstruction or ileus. The ill-defined soft tissue mass along the mid rectum is again identified, difficult to clearly visualize due to loss of fat plane between the mass and the posterior wall the uterus, measuring up to 5.4 x 3.0 cm reference image 60/2. No evidence of acute bowel inflammation. Vascular/Lymphatic: No significant vascular findings. No discrete adenopathy. Reproductive: Uterus is atrophic. There is a 6.8 x 5.7 cm fluid collection within the region of the left adnexa, unchanged since prior exam. It is unclear whether this reflects loculated fluid, peritoneal metastasis, or a left adnexal cyst. Other: Small volume abdominal ascites. Omental caking is again noted, most pronounced in the right upper quadrant, not appreciably changed. No free intraperitoneal gas. No abdominal wall hernia. Musculoskeletal: No acute or destructive bony abnormalities. Reconstructed images demonstrate no additional findings. IMPRESSION: 1. Grossly stable soft tissue mass within the mid rectum, difficult to measure accurately given loss of normal fat plane between the mass and the posterior wall the uterus, compatible with known history of malignancy. 2. Stable cystic structure within the left lower pelvis, either loculated fluid, cystic peritoneal metastases, or left adnexal cyst. 3. Interval development of mild left hydronephrosis and left hydroureter, without clear cause for obstruction identified. The obstruction could be related to extrinsic mass effect upon the distal left ureter by the cystic structure described above. 4. Stable peritoneal metastases and omental caking, most pronounced in the right upper  quadrant. 5. Diffuse metastatic disease throughout the right lobe liver, subjectively worse since prior exam. 6.  Small volume ascites. 7. Bilateral nonobstructing renal calculi. Electronically Signed   By: Ozell Daring M.D.   On: 09/20/2023 17:30   DG Chest Port 1 View Result Date: 09/20/2023 CLINICAL DATA:  Generalized abdominal pain, nausea and vomiting, history of metastatic colon cancer EXAM: PORTABLE CHEST 1 VIEW COMPARISON:  06/20/2023 FINDINGS: Single frontal view of the chest demonstrates an unremarkable cardiac silhouette. No airspace disease, effusion, or pneumothorax. Left-sided PICC tip overlies superior vena cava. No acute bony abnormalities. IMPRESSION: 1. No acute intrathoracic process. Electronically Signed   By: Ozell Daring M.D.   On: 09/20/2023 16:24    EKG: Independently reviewed. Sinus rhythm, LVH.   Assessment/Plan   1. Intractable abdominal pain and N/V  - Continues to have severe pain and intractable N/V despite aggressive treatment in ED   - Continue fentanyl  patch, start Dilaudid  PCA, continue antiemetics, IVF, supportive care, consult TOC as patient is now unable to afford fentanyl  patch    2. Metastatic colon cancer  - Currently treated with FOLFOX under the care of Dr. Cloretta    3. Hypokalemia  - Replacing    4. Hypertensive urgency  - Due to pain and N/V with inability to take her antihypertensives; no target organ damage  - Continue pain-control, use as-needed IV labetalol  for now    5. Hyponatremia  - Mild, in setting of hypovolemia  - Continue NS infusion and repeat chem panel in am    DVT prophylaxis: Lovenox   Code Status: Full  Level of Care: Level of care: Progressive Family Communication: None present   Disposition Plan: Patient is from: Home  Anticipated d/c is to: home  Anticipated d/c date is: 9/8 or 09/22/23  Patient currently: Pain-control, tolerance of adequate oral intake  Consults called: None  Admission status: Observation      Evalene GORMAN Sprinkles, MD [facility]  09/20/2023, 7:47 PM

## 2023-09-20 NOTE — ED Provider Notes (Signed)
 [place] [facility]GENC[facility] DEPARTMENT AT [facility] Provider Note   CSN: [id number] Arrival date & time: 09/20/23  1449     Patient presents with: Abdominal Pain   April [name] is a 48 y.o. female.  Patient past history significant for colorectal cancer, stage IV, malignant neoplasm of the retroperitoneum, prolonged QT, COPD, and rectal adenocarcinoma here with concerns of abdominal pain and vomiting.  Reports that she has been having off-and-on fevers for the last several days with an inability to tolerate p.o. due to significant nausea.  She reports she is scheduled for a chemo session on Thursday of this last week but was unable to make this appointment due to pain, and vomiting.   Abdominal Pain      Prior to Admission medications   Medication Sig Start Date End Date Taking? Authorizing Provider  acetaminophen  (TYLENOL ) 500 MG tablet Take 500 mg by mouth in the morning, at noon, and at bedtime. Patient taking differently: Take 500 mg by mouth 2 (two) times daily.    [provider]  alum & mag hydroxide-simeth (MAALOX/MYLANTA) 200-200-20 MG/5ML suspension Take 30 mLs by mouth every 6 (six) hours as needed for indigestion or heartburn. Patient not taking: Reported on 09/16/2023 06/26/23   Pokhrel, Laxman, MD  amLODipine  (NORVASC ) 10 MG tablet Take 1 tablet (10 mg total) by mouth daily. 06/26/23   Pokhrel, Laxman, MD  cloNIDine  (CATAPRES ) 0.1 MG tablet Take 3 tablets (0.3 mg total) by mouth 3 (three) times daily. Patient not taking: Reported on 09/16/2023 06/09/23   Willette Adriana LABOR, MD  dexamethasone  (DECADRON ) 2 MG tablet Take 1 tablet (2 mg total) by mouth daily. 08/13/23   Patsy Lenis, MD  dicyclomine  (BENTYL ) 20 MG tablet Take 1 tablet (20 mg total) by mouth 3 (three) times daily as needed for spasms (rectal/AB pain). 02/17/23   Cloretta Arley NOVAK, MD  docusate sodium  (COLACE) 100 MG capsule Take 1 capsule (100 mg total) by mouth 2 (two) times daily. 08/17/23    Cloretta Arley NOVAK, MD  famotidine  (PEPCID ) 20 MG tablet Take 1 tablet (20 mg total) once daily x 2 days prior to oxaliplatin  and daily x 2 days after oxaliplatin  dose. Also take Benadryl  25 mg (OTC) daily x 2 days prior to oxaliplatin . 08/19/23   Debby Olam POUR, NP  fentaNYL  (DURAGESIC ) 50 MCG/HR Place 1 patch onto the skin every 3 (three) days. 09/07/23   Pickenpack-Cousar, Athena N, NP  HYDROmorphone  (DILAUDID ) 8 MG tablet Take 1 tablet (8 mg total) by mouth every 3 (three) hours as needed for moderate pain (pain score 4-6) or severe pain (pain score 7-10). 09/19/23   Pickenpack-Cousar, Fannie SAILOR, NP  lactulose  (CHRONULAC ) 10 GM/15ML solution Take 15-30 mLs (10-20 g total) by mouth daily as needed for mild constipation. 07/06/23   Pickenpack-Cousar, Fannie SAILOR, NP  metoprolol  tartrate (LOPRESSOR ) 100 MG tablet Take 1 tablet (100 mg total) by mouth 2 (two) times daily. Patient not taking: Reported on 09/16/2023 06/09/23 09/08/23  Willette Adriana LABOR, MD  pantoprazole  (PROTONIX ) 40 MG tablet Take 1 tablet (40 mg total) by mouth daily before breakfast. 02/27/23   Drusilla Sabas RAMAN, MD  polyethylene glycol powder (GLYCOLAX /MIRALAX ) 17 GM/SCOOP powder Take 17 g by mouth daily as needed. 06/09/23   Shahmehdi, Adriana LABOR, MD  potassium chloride  (MICRO-K ) 10 MEQ CR capsule Take 2 capsules (20 mEq total) by mouth 2 (two) times daily. Patient taking differently: Take 30 mEq by mouth 2 (two) times daily. 06/11/23   Ann,  Lacie K, NP  prochlorperazine  (COMPAZINE ) 10 MG tablet Take 1 tablet (10 mg total) by mouth every 6 (six) hours as needed for nausea or vomiting. Patient not taking: Reported on 09/16/2023 07/06/23   Pickenpack-Cousar, Athena N, NP  senna (SENOKOT) 8.6 MG TABS tablet Take 2 tablets (17.2 mg total) by mouth at bedtime. 06/26/23   Pokhrel, Laxman, MD  Sodium Chloride  Flush (NORMAL SALINE FLUSH) 0.9 % SOLN Use 5 mLs by Intracatheter route daily in biliary drain at 6 (six) AM as directed Patient taking differently: 5 mLs  by Intracatheter route every 3 (three) days. 03/14/23   [place], Camellia PARAS, DO    Allergies: Lisinopril, Irinotecan , Leucovorin  calcium , and Oxaliplatin     Review of Systems  Gastrointestinal:  Positive for abdominal pain.  All other systems reviewed and are negative.   Updated Vital Signs BP (!) 170/124   Pulse 95   Temp 99.4 F (37.4 C) (Rectal)   Resp 16   Ht 5' 4 (1.626 m)   Wt 54.3 kg   LMP 01/07/2006   SpO2 99%   BMI 20.56 kg/m   Physical Exam Vitals and nursing note reviewed.  Constitutional:      General: She is not in acute distress.    Appearance: She is well-developed. She is ill-appearing.  HENT:     Head: Normocephalic and atraumatic.  Eyes:     Conjunctiva/sclera: Conjunctivae normal.  Cardiovascular:     Rate and Rhythm: Normal rate and regular rhythm.     Heart sounds: No murmur heard. Pulmonary:     Effort: Pulmonary effort is normal. No respiratory distress.     Breath sounds: Normal breath sounds.  Abdominal:     Palpations: Abdomen is soft.     Tenderness: There is generalized abdominal tenderness.  Musculoskeletal:        General: No swelling.     Cervical back: Neck supple.  Skin:    General: Skin is warm and dry.     Capillary Refill: Capillary refill takes less than 2 seconds.  Neurological:     Mental Status: She is alert.  Psychiatric:        Mood and Affect: Mood normal.     (all labs ordered are listed, but only abnormal results are displayed) Labs Reviewed  COMPREHENSIVE METABOLIC PANEL WITH GFR - Abnormal; Notable for the following components:      Result Value   Sodium 131 (*)    Potassium 2.7 (*)    Chloride 87 (*)    BUN 5 (*)    Creatinine, Ser 0.40 (*)    Total Protein 8.9 (*)    Albumin 3.4 (*)    AST 50 (*)    Alkaline Phosphatase 624 (*)    Anion gap 16 (*)    All other components within normal limits  CBC WITH DIFFERENTIAL/PLATELET - Abnormal; Notable for the following components:   WBC 12.3 (*)    Hemoglobin  10.6 (*)    HCT 33.6 (*)    RDW 17.3 (*)    Neutro Abs 9.6 (*)    All other components within normal limits  URINALYSIS, W/ REFLEX TO CULTURE (INFECTION SUSPECTED) - Abnormal; Notable for the following components:   Hgb urine dipstick SMALL (*)    Ketones, ur 20 (*)    Protein, ur 30 (*)    All other components within normal limits  CULTURE, BLOOD (ROUTINE X 2)  RESP PANEL BY RT-PCR (RSV, FLU A&B, COVID)  RVPGX2  CULTURE, BLOOD (  ROUTINE X 2)  PROTIME-INR  I-STAT CG4 LACTIC ACID, ED    EKG: EKG Interpretation Date/Time:  Sunday September 20 2023 14:56:50 EDT Ventricular Rate:  82 PR Interval:  135 QRS Duration:  103 QT Interval:  412 QTC Calculation: 482 R Axis:   47  Text Interpretation: Sinus rhythm Left atrial enlargement Left ventricular hypertrophy no significant change since July 2025 Confirmed by Freddi Hamilton [phone]) on 09/20/2023 3:22:11 PM  Radiology: CT ABDOMEN PELVIS W CONTRAST Result Date: 09/20/2023 CLINICAL DATA:  Abdominal pain, history of metastatic colon cancer, nausea and vomiting EXAM: CT ABDOMEN AND PELVIS WITH CONTRAST TECHNIQUE: Multidetector CT imaging of the abdomen and pelvis was performed using the standard protocol following bolus administration of intravenous contrast. RADIATION DOSE REDUCTION: This exam was performed according to the departmental dose-optimization program which includes automated exposure control, adjustment of the mA and/or kV according to patient size and/or use of iterative reconstruction technique. CONTRAST:  OMNIPAQUE  IOHEXOL  300 MG/ML  SOLN COMPARISON:  08/02/2023 FINDINGS: Lower chest: No acute pleural or parenchymal lung disease. Hepatobiliary: Marked heterogeneity throughout the right lobe liver compatible with known diffuse hepatic metastases. The index lesion within the right lobe liver measured previously is more difficult to measure discretely on this exam, with subjective increase in the number of hypodense lesions  consistent with progressive metastases. Persistent indwelling biliary duct stents, with no change in the intrahepatic biliary duct dilation seen on prior study. Pancreas: Unremarkable. No pancreatic ductal dilatation or surrounding inflammatory changes. Spleen: Normal in size without focal abnormality. Adrenals/Urinary Tract: Stable bilateral nonobstructing renal calculi measuring up to 13 mm on the left and 9 mm on the right. Interval development of mild left hydronephrosis and hydroureter, without clear cause for obstruction identified. The adrenals are unremarkable. Bladder is moderately distended without filling defect. Stomach/Bowel: No bowel obstruction or ileus. The ill-defined soft tissue mass along the mid rectum is again identified, difficult to clearly visualize due to loss of fat plane between the mass and the posterior wall the uterus, measuring up to 5.4 x 3.0 cm reference image 60/2. No evidence of acute bowel inflammation. Vascular/Lymphatic: No significant vascular findings. No discrete adenopathy. Reproductive: Uterus is atrophic. There is a 6.8 x 5.7 cm fluid collection within the region of the left adnexa, unchanged since prior exam. It is unclear whether this reflects loculated fluid, peritoneal metastasis, or a left adnexal cyst. Other: Small volume abdominal ascites. Omental caking is again noted, most pronounced in the right upper quadrant, not appreciably changed. No free intraperitoneal gas. No abdominal wall hernia. Musculoskeletal: No acute or destructive bony abnormalities. Reconstructed images demonstrate no additional findings. IMPRESSION: 1. Grossly stable soft tissue mass within the mid rectum, difficult to measure accurately given loss of normal fat plane between the mass and the posterior wall the uterus, compatible with known history of malignancy. 2. Stable cystic structure within the left lower pelvis, either loculated fluid, cystic peritoneal metastases, or left adnexal cyst.  3. Interval development of mild left hydronephrosis and left hydroureter, without clear cause for obstruction identified. The obstruction could be related to extrinsic mass effect upon the distal left ureter by the cystic structure described above. 4. Stable peritoneal metastases and omental caking, most pronounced in the right upper quadrant. 5. Diffuse metastatic disease throughout the right lobe liver, subjectively worse since prior exam. 6. Small volume ascites. 7. Bilateral nonobstructing renal calculi. Electronically Signed   By: Ozell Daring M.D.   On: 09/20/2023 17:30   DG Chest Port 1  View Result Date: 09/20/2023 CLINICAL DATA:  Generalized abdominal pain, nausea and vomiting, history of metastatic colon cancer EXAM: PORTABLE CHEST 1 VIEW COMPARISON:  06/20/2023 FINDINGS: Single frontal view of the chest demonstrates an unremarkable cardiac silhouette. No airspace disease, effusion, or pneumothorax. Left-sided PICC tip overlies superior vena cava. No acute bony abnormalities. IMPRESSION: 1. No acute intrathoracic process. Electronically Signed   By: Ozell Daring M.D.   On: 09/20/2023 16:24     Procedures   Medications Ordered in the ED  HYDROmorphone  (DILAUDID ) injection 1 mg (has no administration in time range)  lactated ringers  bolus 1,000 mL (1,000 mLs Intravenous New Bag/Given 09/20/23 1533)  morphine  (PF) 4 MG/ML injection 4 mg (4 mg Intravenous Given 09/20/23 1532)  ondansetron  (ZOFRAN ) injection 4 mg (4 mg Intravenous Given 09/20/23 1532)  diphenhydrAMINE  (BENADRYL ) injection 12.5 mg (12.5 mg Intravenous Given 09/20/23 1530)  iohexol  (OMNIPAQUE ) 300 MG/ML solution 100 mL (100 mLs Intravenous Contrast Given 09/20/23 1706)  potassium chloride  10 mEq in 100 mL IVPB (0 mEq Intravenous Stopped 09/20/23 1945)  HYDROmorphone  (DILAUDID ) injection 1 mg (1 mg Intravenous Given 09/20/23 1802)  hydrALAZINE  (APRESOLINE ) injection 10 mg (10 mg Intravenous Given 09/20/23 1834)  promethazine  (PHENERGAN ) 12.5  mg in sodium chloride  0.9 % 50 mL IVPB (0 mg Intravenous Stopped 09/20/23 1944)                                    Medical Decision Making Amount and/or Complexity of Data Reviewed Labs: ordered. Radiology: ordered.  Risk Prescription drug management. Decision regarding hospitalization.   This patient presents to the ED for concern of abdominal pain, this involves an extensive number of treatment options, and is a complaint that carries with it a high risk of complications and morbidity.  The differential diagnosis includes progressive metastatic disease of colorectal cancer, gastroenteritis, bowel obstruction, bowel perforation, viral URI   Co morbidities that complicate the patient evaluation  Metastatic disease with primary colorectal cancer, stage IV, COPD, prolonged QT   Lab Tests:  I Ordered, and personally interpreted labs.  The pertinent results include: CBC with leukocytosis at 12.3, patient's baseline hemoglobin at 10.6 indicating anemia, CMP with hyponatremia, hypokalemia 2.7, elevated anion gap at 16, UA without clear signs of infection, troponin negative, lactic acid normal at 0.7   Imaging Studies ordered:  I ordered imaging studies including CT abdomen pelvis, chest x-ray I independently visualized and interpreted imaging which showed chest x-ray negative for any acute findings, CT abdomen pelvis grossly stable soft tissue mass within the mid rectum, difficult to measure accurately given loss of normal fat plane between the mass and the posterior wall the uterus, compatible with known history of malignancy. 2. Stable cystic structure within the left lower pelvis, either loculated fluid, cystic peritoneal metastases, or left adnexal cyst. 3. Interval development of mild left hydronephrosis and left hydroureter, without clear cause for obstruction identified. The obstruction could be related to extrinsic mass effect upon the distal left ureter by the cystic structure  described above. 4. Stable peritoneal metastases and omental caking, most pronounced in the right upper quadrant. 5. Diffuse metastatic disease throughout the right lobe liver, subjectively worse since prior exam. 6. Small volume ascites. 7. Bilateral nonobstructing renal calculi. I agree with the radiologist interpretation   Cardiac Monitoring: / EKG:  The patient was maintained on a cardiac monitor.  I personally viewed and interpreted the cardiac monitored which showed an  underlying rhythm of: Sinus rhythm   Consultations Obtained:  I requested consultation with the hospitalist,  and discussed lab and imaging findings as well as pertinent plan - they recommend: Spoke with Dr. Charlton, hospitalist, who will be admitting patient.   Problem List / ED Course / Critical interventions / Medication management  Patient with past history significant for colorectal cancer, stage IV, malignant neoplasm of the retroperitoneum, prolonged QT, COPD, rectal adenocarcinoma here with concerns she had like a week with of abdominal pain and vomiting.  Reports as an ongoing for the last 3 days.  She endorses subjective fevers as she has had chills and sweats while at home but denies any measured temperature.  No sick contacts as far she is aware.  She reports that she is scheduled for a chemo session on Thursday, 3 days ago but was not able to make this appointment due to feeling unwell. On exam, patient is ill-appearing but nontoxic.  Vitals are all borderline concerning with heart rate in the 90s, temperature slightly elevated 99.6, but has thankfully normally SpO2 on room air 9 9% respiratory rate of 14.  Patient is slightly hypertensive at this time.  Will initiate treatment with antiemetics, morphine , fluids.  Sepsis order set initiated although we will hold off on antibiotic initiation at this time. Lactic acid normal at 0.7. Patient not febrile even with rectal temperature. Will proceed with workup but code  sepsis not indicated at this time as she is only meeting one SIRS criteria with WBC at 12.3.  Chest xray negative. CTAP largely stable with some progressive findings on the right lobe of the liver. Do not feel that CT findings clearly explain her intractable nausea and vomiting as well as abdominal pain that is minimally improved with multiple rounds of parenteral pain medications as well as antiemetics. Still not tolerating PO intake. Will consult hospitalist for admission. I ordered medication including fluids, morphine , Zofran , Benadryl , Dilaudid , potassium, Phenergan  for dehydration, pain, nausea, itching, hypokalemia Reevaluation of the patient after these medicines showed that the patient stayed the same I have reviewed the patients home medicines and have made adjustments as needed   Social Determinants of Health:  Metastatic cancer   Test / Admission - Considered:  Admission indicated due to intractable nausea and vomiting.  Final diagnoses:  Intractable nausea and vomiting  Colorectal cancer, stage IV (HCC)  Hypokalemia  Hypertension, unspecified type    ED Discharge Orders     None          Cecily Legrand LABOR, PA-C 09/20/23 1945    Freddi Hamilton, MD 09/20/23 [phone]

## 2023-09-20 NOTE — ED Triage Notes (Signed)
 Pt BIBA from home for generalized abdominal pain that started Thursday.  Pt was scheduled for chemo tx Thursday but was unable to go due to pain, nausea and vomiting.  Pt unable to keep anything down including meds.    170/122 88 HR  99% RA

## 2023-09-21 ENCOUNTER — Encounter: Payer: Self-pay | Admitting: Oncology

## 2023-09-21 ENCOUNTER — Telehealth: Payer: Self-pay | Admitting: Oncology

## 2023-09-21 ENCOUNTER — Other Ambulatory Visit: Payer: Self-pay | Admitting: Nurse Practitioner

## 2023-09-21 ENCOUNTER — Other Ambulatory Visit (HOSPITAL_COMMUNITY): Payer: Self-pay

## 2023-09-21 DIAGNOSIS — C19 Malignant neoplasm of rectosigmoid junction: Secondary | ICD-10-CM | POA: Diagnosis not present

## 2023-09-21 DIAGNOSIS — R54 Age-related physical debility: Secondary | ICD-10-CM | POA: Diagnosis present

## 2023-09-21 DIAGNOSIS — Z79899 Other long term (current) drug therapy: Secondary | ICD-10-CM | POA: Diagnosis not present

## 2023-09-21 DIAGNOSIS — I160 Hypertensive urgency: Secondary | ICD-10-CM | POA: Diagnosis present

## 2023-09-21 DIAGNOSIS — Z7189 Other specified counseling: Secondary | ICD-10-CM | POA: Diagnosis not present

## 2023-09-21 DIAGNOSIS — Z515 Encounter for palliative care: Secondary | ICD-10-CM | POA: Diagnosis not present

## 2023-09-21 DIAGNOSIS — R112 Nausea with vomiting, unspecified: Secondary | ICD-10-CM | POA: Diagnosis not present

## 2023-09-21 DIAGNOSIS — D649 Anemia, unspecified: Secondary | ICD-10-CM | POA: Diagnosis not present

## 2023-09-21 DIAGNOSIS — Z8249 Family history of ischemic heart disease and other diseases of the circulatory system: Secondary | ICD-10-CM | POA: Diagnosis not present

## 2023-09-21 DIAGNOSIS — Z888 Allergy status to other drugs, medicaments and biological substances status: Secondary | ICD-10-CM | POA: Diagnosis not present

## 2023-09-21 DIAGNOSIS — R188 Other ascites: Secondary | ICD-10-CM | POA: Diagnosis present

## 2023-09-21 DIAGNOSIS — Z6820 Body mass index (BMI) 20.0-20.9, adult: Secondary | ICD-10-CM | POA: Diagnosis not present

## 2023-09-21 DIAGNOSIS — R64 Cachexia: Secondary | ICD-10-CM | POA: Diagnosis present

## 2023-09-21 DIAGNOSIS — C187 Malignant neoplasm of sigmoid colon: Secondary | ICD-10-CM | POA: Diagnosis present

## 2023-09-21 DIAGNOSIS — C787 Secondary malignant neoplasm of liver and intrahepatic bile duct: Secondary | ICD-10-CM | POA: Diagnosis present

## 2023-09-21 DIAGNOSIS — E860 Dehydration: Secondary | ICD-10-CM | POA: Diagnosis present

## 2023-09-21 DIAGNOSIS — G893 Neoplasm related pain (acute) (chronic): Secondary | ICD-10-CM | POA: Diagnosis present

## 2023-09-21 DIAGNOSIS — K5900 Constipation, unspecified: Secondary | ICD-10-CM | POA: Diagnosis present

## 2023-09-21 DIAGNOSIS — D630 Anemia in neoplastic disease: Secondary | ICD-10-CM | POA: Diagnosis present

## 2023-09-21 DIAGNOSIS — E876 Hypokalemia: Secondary | ICD-10-CM | POA: Diagnosis present

## 2023-09-21 DIAGNOSIS — C786 Secondary malignant neoplasm of retroperitoneum and peritoneum: Secondary | ICD-10-CM | POA: Diagnosis present

## 2023-09-21 DIAGNOSIS — N132 Hydronephrosis with renal and ureteral calculous obstruction: Secondary | ICD-10-CM | POA: Diagnosis present

## 2023-09-21 DIAGNOSIS — E871 Hypo-osmolality and hyponatremia: Secondary | ICD-10-CM | POA: Diagnosis present

## 2023-09-21 DIAGNOSIS — E861 Hypovolemia: Secondary | ICD-10-CM | POA: Diagnosis present

## 2023-09-21 DIAGNOSIS — Z1152 Encounter for screening for COVID-19: Secondary | ICD-10-CM | POA: Diagnosis not present

## 2023-09-21 DIAGNOSIS — J449 Chronic obstructive pulmonary disease, unspecified: Secondary | ICD-10-CM | POA: Diagnosis present

## 2023-09-21 DIAGNOSIS — Z87891 Personal history of nicotine dependence: Secondary | ICD-10-CM | POA: Diagnosis not present

## 2023-09-21 DIAGNOSIS — I10 Essential (primary) hypertension: Secondary | ICD-10-CM | POA: Diagnosis present

## 2023-09-21 DIAGNOSIS — I7121 Aneurysm of the ascending aorta, without rupture: Secondary | ICD-10-CM | POA: Diagnosis present

## 2023-09-21 LAB — BASIC METABOLIC PANEL WITH GFR
Anion gap: 7 (ref 5–15)
Anion gap: 11 (ref 5–15)
BUN: <5 mg/dL — ABNORMAL LOW (ref 6–20)
BUN: <5 mg/dL — ABNORMAL LOW (ref 6–20)
CO2: 25 mmol/L (ref 22–32)
CO2: 31 mmol/L (ref 22–32)
Calcium: 7.8 mg/dL — ABNORMAL LOW (ref 8.9–10.3)
Calcium: 8.9 mg/dL (ref 8.9–10.3)
Chloride: 105 mmol/L (ref 98–111)
Chloride: 93 mmol/L — ABNORMAL LOW (ref 98–111)
Creatinine, Ser: 0.38 mg/dL — ABNORMAL LOW (ref 0.44–1.00)
Creatinine, Ser: 0.51 mg/dL (ref 0.44–1.00)
GFR, Estimated: >60 mL/min (ref >60)
GFR, Estimated: >60 mL/min (ref >60)
Glucose, Bld: 80 mg/dL (ref 70–99)
Glucose, Bld: 119 mg/dL — ABNORMAL HIGH (ref 70–99)
Potassium: >7.5 mmol/L (ref 3.5–5.1)
Potassium: 3.3 mmol/L — ABNORMAL LOW (ref 3.5–5.1)
Sodium: 137 mmol/L (ref 135–145)
Sodium: 135 mmol/L (ref 135–145)

## 2023-09-21 LAB — BLOOD CULTURE ID PANEL (REFLEXED) - BCID2

## 2023-09-21 LAB — HEPATIC FUNCTION PANEL
ALT: 17 U/L (ref 0–44)
AST: 51 U/L — ABNORMAL HIGH (ref 15–41)
Albumin: 2.9 g/dL — ABNORMAL LOW (ref 3.5–5.0)
Alkaline Phosphatase: 528 U/L — ABNORMAL HIGH (ref 38–126)
Bilirubin, Direct: 0.4 mg/dL — ABNORMAL HIGH (ref 0.0–0.2)
Indirect Bilirubin: 0.4 mg/dL (ref 0.3–0.9)
Total Bilirubin: 0.8 mg/dL (ref 0.0–1.2)
Total Protein: 7.6 g/dL (ref 6.5–8.1)

## 2023-09-21 LAB — CBC
HCT: 30.1 % — ABNORMAL LOW (ref 36.0–46.0)
Hemoglobin: 9.2 g/dL — ABNORMAL LOW (ref 12.0–15.0)
MCH: 27 pg (ref 26.0–34.0)
MCHC: 30.6 g/dL (ref 30.0–36.0)
MCV: 88.3 fL (ref 80.0–100.0)
Platelets: 293 K/uL (ref 150–400)
RBC: 3.41 MIL/uL — ABNORMAL LOW (ref 3.87–5.11)
RDW: 17.5 % — ABNORMAL HIGH (ref 11.5–15.5)
WBC: 9.4 K/uL (ref 4.0–10.5)
nRBC: 0 % (ref 0.0–0.2)

## 2023-09-21 LAB — MAGNESIUM: Magnesium: 1.8 mg/dL (ref 1.7–2.4)

## 2023-09-21 MED ORDER — ALTEPLASE 2 MG IJ SOLR
2.0000 mg | Freq: Once | INTRAMUSCULAR | Status: AC
  Administered 2023-09-21: 2 mg
  Filled 2023-09-21: qty 2

## 2023-09-21 MED ORDER — POTASSIUM CHLORIDE CRYS ER 20 MEQ PO TBCR
40.0000 meq | EXTENDED_RELEASE_TABLET | Freq: Once | ORAL | Status: AC
  Administered 2023-09-21: 40 meq via ORAL
  Filled 2023-09-21: qty 2

## 2023-09-21 MED ORDER — CHLORHEXIDINE GLUCONATE CLOTH 2 % EX PADS
6.0000 | MEDICATED_PAD | Freq: Every day | CUTANEOUS | Status: DC
  Administered 2023-09-21 – 2023-09-25 (×4): 6 via TOPICAL

## 2023-09-21 MED ORDER — SODIUM CHLORIDE 0.9% FLUSH: 10.0000 mL | INTRAVENOUS | Status: DC | PRN

## 2023-09-21 MED ORDER — SENNOSIDES-DOCUSATE SODIUM 8.6-50 MG PO TABS
1.0000 | ORAL_TABLET | Freq: Two times a day (BID) | ORAL | Status: DC
  Administered 2023-09-21 – 2023-09-22 (×2): 1 via ORAL
  Filled 2023-09-21 (×2): qty 1

## 2023-09-21 NOTE — TOC Initial Note (Signed)
 Transition of Care [facility]) - Initial/Assessment Note   Patient Details  Name: April [name] MRN: [id number] Date of Birth: 08-Mar-1975  Transition of Care [facility]) CM/SW Contact:    Duwaine GORMAN Aran, LCSW Phone Number: 09/21/2023, 10:39 AM  Clinical Narrative: Care management consulted for medication assistance for fentanyl  patch that patient uses for severe chronic cancer-related pain,  but patient cannot afford it. As patient has insurance, patient is not eligible for medication assistance through IP CM department. CSW notified hospitalist.  Expected Discharge Plan: Home/Self Care Barriers to Discharge: Continued Medical Work up  Patient Goals and CMS Choice Patient states their goals for this hospitalization and ongoing recovery are:: Get medications covered Choice offered to / list presented to : NA  Expected Discharge Plan and Services In-house Referral: Clinical Social Work Post Acute Care Choice: NA Living arrangements for the past 2 months: Single Family Home           DME Arranged: N/A DME Agency: NA  Prior Living Arrangements/Services Living arrangements for the past 2 months: Single Family Home Patient language and need for interpreter reviewed:: Yes Do you feel safe going back to the place where you live?: Yes      Need for Family Participation in Patient Care: No (Comment) Care giver support system in place?: Yes (comment) Criminal Activity/Legal Involvement Pertinent to Current Situation/Hospitalization: No - Comment as needed  Activities of Daily Living ADL Screening (condition at time of admission) Independently performs ADLs?: Yes (appropriate for developmental age) Is the patient deaf or have difficulty hearing?: No Does the patient have difficulty seeing, even when wearing glasses/contacts?: No Does the patient have difficulty concentrating, remembering, or making decisions?: No  Emotional Assessment Orientation: : Oriented to Self, Oriented to Place,  Oriented to  Time, Oriented to Situation Alcohol / Substance Use: Not Applicable Psych Involvement: No (comment)  Admission diagnosis:  Hypokalemia [E87.6] Colorectal cancer, stage IV (HCC) [C19] Intractable nausea and vomiting [R11.2] Hypertension, unspecified type [I10] Patient Active Problem List   Diagnosis Date Noted   Uncontrolled pain 08/07/2023   Peritoneal carcinomatosis (HCC) 08/03/2023   Goals of care, counseling/discussion 08/03/2023   Constipation 08/03/2023   Intractable pain 08/03/2023   DNR (do not resuscitate) discussion 06/22/2023   ACP (advance care planning) 06/22/2023   Hypovolemia 06/21/2023   Rectal adenocarcinoma (HCC) 06/21/2023   Nausea 06/21/2023   EKG, abnormal 06/21/2023   Counseling and coordination of care 06/21/2023   Malignant neoplasm metastatic to liver (HCC) 06/21/2023   Cancer associated pain 06/21/2023   Medication management 06/21/2023   Palliative care by specialist 06/21/2023   Accelerated hypertension 06/08/2023   Cancer of ascending colon metastatic to intra-abdominal lymph node (HCC) 06/07/2023   Hypertensive urgency 03/24/2023   Abdominal pain, chronic, right upper quadrant 03/09/2023   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   Upper abdominal pain 02/24/2023   Hyperproteinemia 01/24/2023   Elevated alkaline phosphatase level 01/24/2023   History of biliary stent insertion 12/31/2022   Intrahepatic bile duct dilation 12/31/2022   Imaging of gastrointestinal tract abnormal 12/31/2022   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   Enterocolitis 12/26/2022   Bacteremia due to Klebsiella pneumoniae 12/26/2022   Hyponatremia 12/24/2022   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   Ascending aortic aneurysm (HCC) 12/16/2022   Diverticulosis of colon without hemorrhage 11/23/2022   Abnormal CT of the abdomen 11/21/2022   Cancer related pain 11/19/2022   Acute nonintractable headache 08/11/2022   History of ERCP 06/28/2022   Former smoker  04/30/2022  Intractable nausea and vomiting 04/16/2022   Protein-calorie malnutrition, severe 03/26/2022   Chronic constipation 03/26/2022   Metastatic colon cancer to liver (HCC) 03/24/2022   Obstructive jaundice 03/24/2022   Biliary stricture 03/24/2022   Biliary obstruction 03/23/2022   Hyperbilirubinemia 03/21/2022   Abnormal LFTs 03/21/2022   Kidney stone 08/01/2021   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   Sepsis (HCC)    Colorectal cancer, stage IV (HCC) 10/15/2020   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   Port-A-Cath in place 11/22/2019   High risk medication use 06/07/2019   Adnexal mass 04/09/2019   Malignant neoplasm of rectosigmoid (colon) (HCC) 04/08/2019   Hypertension 01/07/2018   Sinus tachycardia 01/07/2018   Hypokalemia 01/07/2018   Prolonged QT interval 01/07/2018   Hyperglycemia 01/07/2018   Benign essential hypertension 10/06/2017   PCP:  Oris Camie BRAVO, NP Pharmacy:   [facility] [address] [place] [address] Phone: [phone] Fax: [phone]  Social Drivers of Health (SDOH) Social History: SDOH Screenings   Food Insecurity: No Food Insecurity (09/20/2023)  Housing: Low Risk  (09/20/2023)  Transportation Needs: No Transportation Needs (09/20/2023)  Utilities: Not At Risk (09/20/2023)  Alcohol Screen: Low Risk  (11/11/2022)  Depression (PHQ2-9): Low Risk  (09/16/2023)  Financial Resource Strain: Low Risk  (11/11/2022)  Physical Activity: Inactive (11/11/2022)  Social Connections: Socially Isolated (01/24/2023)  Stress: No Stress Concern Present (11/11/2022)  Tobacco Use: Medium Risk (09/20/2023)  Health Literacy: Adequate Health Literacy (11/11/2022)   SDOH Interventions:    Readmission Risk Interventions    08/12/2023    1:51 PM 06/23/2023    3:12 PM  Readmission Risk Prevention Plan  Transportation Screening  Complete Complete  Medication Review Oceanographer) Complete Complete  PCP or Specialist appointment within 3-5 days of discharge Complete Complete  HRI or Home Care Consult Complete Complete  SW Recovery Care/Counseling Consult Complete Complete  Palliative Care Screening Complete Not Applicable  Skilled Nursing Facility Not Applicable Not Applicable

## 2023-09-21 NOTE — Progress Notes (Signed)
 PHARMACY - PHYSICIAN COMMUNICATION CRITICAL VALUE ALERT - BLOOD CULTURE IDENTIFICATION (BCID)  April [name] is an 48 y.o. female who presented to [facility] on 09/20/2023 with a chief complaint of abdominal pain, N/V.    Assessment:   9/7 BCx: 1 out of 4 with gpc in clusters; BCID detected staph species.  Name of physician (or Provider) Contacted: Regalado  Current antibiotics: None  Changes to prescribed antibiotics recommended:  Considered a contaminant with no treatment recommended.  Results for orders placed or performed during the hospital encounter of 09/20/23  Blood Culture ID Panel (Reflexed) (Collected: 09/20/2023  9:53 PM)  Result Value Ref Range   Enterococcus faecalis NOT DETECTED NOT DETECTED   Enterococcus Faecium NOT DETECTED NOT DETECTED   Listeria monocytogenes NOT DETECTED NOT DETECTED   Staphylococcus species DETECTED (A) NOT DETECTED   Staphylococcus aureus (BCID) NOT DETECTED NOT DETECTED   Staphylococcus epidermidis NOT DETECTED NOT DETECTED   Staphylococcus lugdunensis NOT DETECTED NOT DETECTED   Streptococcus species NOT DETECTED NOT DETECTED   Streptococcus agalactiae NOT DETECTED NOT DETECTED   Streptococcus pneumoniae NOT DETECTED NOT DETECTED   Streptococcus pyogenes NOT DETECTED NOT DETECTED   A.calcoaceticus-baumannii NOT DETECTED NOT DETECTED   Bacteroides fragilis NOT DETECTED NOT DETECTED   Enterobacterales NOT DETECTED NOT DETECTED   Enterobacter cloacae complex NOT DETECTED NOT DETECTED   Escherichia coli NOT DETECTED NOT DETECTED   Klebsiella aerogenes NOT DETECTED NOT DETECTED   Klebsiella oxytoca NOT DETECTED NOT DETECTED   Klebsiella pneumoniae NOT DETECTED NOT DETECTED   Proteus species NOT DETECTED NOT DETECTED   Salmonella species NOT DETECTED NOT DETECTED   Serratia marcescens NOT DETECTED NOT DETECTED   Haemophilus influenzae NOT DETECTED NOT DETECTED   Neisseria meningitidis NOT DETECTED NOT DETECTED   Pseudomonas  aeruginosa NOT DETECTED NOT DETECTED   Stenotrophomonas maltophilia NOT DETECTED NOT DETECTED   Candida albicans NOT DETECTED NOT DETECTED   Candida auris NOT DETECTED NOT DETECTED   Candida glabrata NOT DETECTED NOT DETECTED   Candida krusei NOT DETECTED NOT DETECTED   Candida parapsilosis NOT DETECTED NOT DETECTED   Candida tropicalis NOT DETECTED NOT DETECTED   Cryptococcus neoformans/gattii NOT DETECTED NOT DETECTED    Eleanor Agent, PharmD, BCPS 09/21/2023  3:17 PM

## 2023-09-21 NOTE — Telephone Encounter (Signed)
 PT called, has been in hospital since the weekend. Needs to reschedule TXT. Told PT to call us  back after she gets out the hospital per Nurse.

## 2023-09-21 NOTE — Progress Notes (Signed)
 PROGRESS NOTE    April [name]  FMW:[id number] DOB: [birth year] DOA: 09/20/2023 PCP: Oris Camie BRAVO, NP   Brief Narrative: 48 year old with past medical history significant for stage IV colorectal cancer with metastasis, hypertension, cancer related chronic pain who presented with increased abdominal pain nausea and vomiting.  Patient is undergoing treatment of metastatic colon cancer, but missed cycle 3 on 09/17/18/2025 due to worsening of chronic abdominal pain along with nausea and vomiting.  Evaluation in the ED patient was noted to have a potassium of 2.7, normal creatinine, elevated alkaline phosphatase 624, white blood cell 12.  CT abdomen and pelvis showed new left hydroureteronephrosis without clear etiology and suspected increase in the right lower lobe metastatic disease in addition to other stable findings.   Assessment & Plan:   Principal Problem:   Intractable nausea and vomiting Active Problems:   Malignant neoplasm of rectosigmoid (colon) (HCC)   Hypertensive urgency   Hypokalemia   Hyponatremia   1-Intractable abdominal pain, nausea and vomiting: - In the setting of malignancy.  -CT abdomen pelvis:  stable soft tissue mass within the mid rectum, compatible with known history of malignancy. Stable cystic structure within the left lower pelvis, either loculated fluid, cystic peritoneal metastases, or left adnexal cyst. Interval development of mild left hydronephrosis and left hydroureter, without clear cause for obstruction identified. Stable peritoneal metastases and omental caking, most pronounced in the right upper quadrant.Diffuse metastatic disease throughout the right lobe liver, subjectively worse since prior exam. Small volume ascites. - Continue IV fluids, supportive care -Started on Dilaudid  PCA -Palliative  care consulted. -Patient is not able to afford  hydromorphone .  Sent message to palliative care team outpatient.  Palliative care started  insurance authorization for hydromorphone  as an outpatient. Patient was asking for PCA pump for home.  She is supposed to follow-up with IR for pump insertion.  Palliative care has consulted IR.  The plan is to proceed with inpatient test dose.  Pump to be placed next week  Metastatic colon cancer: On FOLFOX under the care of Dr. Cloretta  Dr Cloretta added to treatment team   Hypokalemia: - Replete with IV fluids and oral potassium  Hypekalemia likely lab error, B mL was obtained from PICC line while IV potassium infusion was infusing  Hypertensive  urgency: - In the setting of vomiting and unable to take oral medications. As needed labetalol  -Resume Norvasc   Hyponatremia: - Received IV fluids     Estimated body mass index is 20.56 kg/m as calculated from the following:   Height as of this encounter: 5' 4 (1.626 m).   Weight as of this encounter: 54.3 kg.   DVT prophylaxis: Lovenox  Code Status: Full code Family Communication: Care discussed with patient Disposition Plan:  Status is: Observation The patient will require care spanning > 2 midnights and should be moved to inpatient because: PCA pump, requiring IV fluids    Consultants:  IR Palliative care  Procedures:  none Antimicrobials:    Subjective: She reports her pain is better controlled on the current PCA pump, she is asking with assistant for hydromorphone , she will have to pay $80 for it.  She also was asking for PCA pump to go home with.  Outpatient palliative care was working on that  Objective: Vitals:   09/20/23 2341 09/21/23 0018 09/21/23 0315 09/21/23 0323  BP: (!) 181/128 (!) 177/121 135/85   Pulse: 88 98 96   Resp: 17  13 14   Temp: 99.1 F (37.3 C)  98.7  F (37.1 C)   TempSrc: Oral  Oral   SpO2: 100%  98%   Weight:      Height:        Intake/Output Summary (Last 24 hours) at 09/21/2023 0748 Last data filed at 09/21/2023 0200 Gross per 24 hour  Intake 492.58 ml  Output --  Net 492.58  ml   Filed Weights   09/20/23 1650  Weight: 54.3 kg    Examination:  General exam: Appears calm and comfortable  Respiratory system: Clear to auscultation. Respiratory effort normal. Cardiovascular system: S1 & S2 heard, RRR. No JVD, murmurs, rubs, gallops or clicks. No pedal edema. Gastrointestinal system: Abdomen is soft, tender, distended Central nervous system: Alert and oriented. No focal neurological deficits. Extremities: Symmetric 5 x 5 power.   Data Reviewed: I have personally reviewed following labs and imaging studies  CBC: Recent Labs  Lab 09/16/23 1335 09/20/23 1524 09/21/23 0612  WBC 6.8 12.3* 9.4  NEUTROABS 3.5 9.6*  --   HGB 10.5* 10.6* 9.2*  HCT 32.6* 33.6* 30.1*  MCV 86.0 86.2 88.3  PLT 339 365 293   Basic Metabolic Panel: Recent Labs  Lab 09/16/23 1335 09/16/23 1626 09/20/23 1524 09/21/23 0612  NA 134*  --  131* 137  K 2.7*  --  2.7* >7.5*  CL 93*  --  87* 105  CO2 30  --  27 25  GLUCOSE 88  --  73 80  BUN <5*  --  5* <5*  CREATININE 0.48  --  0.40* 0.38*  CALCIUM  9.3  --  9.2 7.8*  MG  --  1.9  --  1.8   GFR: Estimated Creatinine Clearance: 72.9 mL/min (A) (by C-G formula based on SCr of 0.38 mg/dL (L)). Liver Function Tests: Recent Labs  Lab 09/16/23 1335 09/20/23 1524 09/21/23 0612  AST 59* 50* 51*  ALT 26 23 17   ALKPHOS 710* 624* 528*  BILITOT 0.7 0.9 0.8  PROT 8.8* 8.9* 7.6  ALBUMIN 3.4* 3.4* 2.9*   No results for input(s): LIPASE, AMYLASE in the last 168 hours. No results for input(s): AMMONIA in the last 168 hours. Coagulation Profile: Recent Labs  Lab 09/20/23 1524  INR 0.9   Cardiac Enzymes: No results for input(s): CKTOTAL, CKMB, CKMBINDEX, TROPONINI in the last 168 hours. BNP (last 3 results) No results for input(s): PROBNP in the last 8760 hours. HbA1C: No results for input(s): HGBA1C in the last 72 hours. CBG: No results for input(s): GLUCAP in the last 168 hours. Lipid Profile: No  results for input(s): CHOL, HDL, LDLCALC, TRIG, CHOLHDL, LDLDIRECT in the last 72 hours. Thyroid Function Tests: No results for input(s): TSH, T4TOTAL, FREET4, T3FREE, THYROIDAB in the last 72 hours. Anemia Panel: No results for input(s): VITAMINB12, FOLATE, FERRITIN, TIBC, IRON, RETICCTPCT in the last 72 hours. Sepsis Labs: Recent Labs  Lab 09/20/23 1531  LATICACIDVEN 0.7    Recent Results (from the past 240 hours)  Resp panel by RT-PCR (RSV, Flu A&B, Covid)     Status: None   Collection Time: 09/20/23  3:24 PM   Specimen: Nasal Swab  Result Value Ref Range Status   SARS Coronavirus 2 by RT PCR NEGATIVE NEGATIVE Final    Comment: (NOTE) SARS-CoV-2 target nucleic acids are NOT DETECTED.  The SARS-CoV-2 RNA is generally detectable in upper respiratory specimens during the acute phase of infection. The lowest concentration of SARS-CoV-2 viral copies this assay can detect is 138 copies/mL. A negative result does not preclude SARS-Cov-2 infection and  should not be used as the sole basis for treatment or other patient management decisions. A negative result may occur with  improper specimen collection/handling, submission of specimen other than nasopharyngeal swab, presence of viral mutation(s) within the areas targeted by this assay, and inadequate number of viral copies(<138 copies/mL). A negative result must be combined with clinical observations, patient history, and epidemiological information. The expected result is Negative.  Fact Sheet for Patients:  BloggerCourse.com  Fact Sheet for Healthcare Providers:  SeriousBroker.it  This test is no t yet approved or cleared by the [place]  FDA and  has been authorized for detection and/or diagnosis of SARS-CoV-2 by FDA under an Emergency Use Authorization (EUA). This EUA will remain  in effect (meaning this test can be used) for the duration  of the COVID-19 declaration under Section 564(b)(1) of the Act, 21 U.S.C.section 360bbb-3(b)(1), unless the authorization is terminated  or revoked sooner.       Influenza A by PCR NEGATIVE NEGATIVE Final   Influenza B by PCR NEGATIVE NEGATIVE Final    Comment: (NOTE) The Xpert Xpress SARS-CoV-2/FLU/RSV plus assay is intended as an aid in the diagnosis of influenza from Nasopharyngeal swab specimens and should not be used as a sole basis for treatment. Nasal washings and aspirates are unacceptable for Xpert Xpress SARS-CoV-2/FLU/RSV testing.  Fact Sheet for Patients: BloggerCourse.com  Fact Sheet for Healthcare Providers: SeriousBroker.it  This test is not yet approved or cleared by the [place]  FDA and has been authorized for detection and/or diagnosis of SARS-CoV-2 by FDA under an Emergency Use Authorization (EUA). This EUA will remain in effect (meaning this test can be used) for the duration of the COVID-19 declaration under Section 564(b)(1) of the Act, 21 U.S.C. section 360bbb-3(b)(1), unless the authorization is terminated or revoked.     Resp Syncytial Virus by PCR NEGATIVE NEGATIVE Final    Comment: (NOTE) Fact Sheet for Patients: BloggerCourse.com  Fact Sheet for Healthcare Providers: SeriousBroker.it  This test is not yet approved or cleared by the [place]  FDA and has been authorized for detection and/or diagnosis of SARS-CoV-2 by FDA under an Emergency Use Authorization (EUA). This EUA will remain in effect (meaning this test can be used) for the duration of the COVID-19 declaration under Section 564(b)(1) of the Act, 21 U.S.C. section 360bbb-3(b)(1), unless the authorization is terminated or revoked.  Performed at [facility], [address], [place], [place] [address]   Blood Culture (routine x 2)     Status: None (Preliminary  result)   Collection Time: 09/20/23  3:30 PM   Specimen: BLOOD LEFT HAND  Result Value Ref Range Status   Specimen Description   Final    BLOOD LEFT HAND Performed at [facility], [address], [place], [place] [address]    Special Requests   Final    BOTTLES DRAWN AEROBIC AND ANAEROBIC Blood Culture adequate volume Performed at [facility], [address] [place], [place] [address]    Culture PENDING  Incomplete   Report Status PENDING  Incomplete         Radiology Studies: CT ABDOMEN PELVIS W CONTRAST Result Date: 09/20/2023 CLINICAL DATA:  Abdominal pain, history of metastatic colon cancer, nausea and vomiting EXAM: CT ABDOMEN AND PELVIS WITH CONTRAST TECHNIQUE: Multidetector CT imaging of the abdomen and pelvis was performed using the standard protocol following bolus administration of intravenous contrast. RADIATION DOSE REDUCTION: This exam was performed according to the departmental dose-optimization program which  includes automated exposure control, adjustment of the mA and/or kV according to patient size and/or use of iterative reconstruction technique. CONTRAST:  OMNIPAQUE  IOHEXOL  300 MG/ML  SOLN COMPARISON:  08/02/2023 FINDINGS: Lower chest: No acute pleural or parenchymal lung disease. Hepatobiliary: Marked heterogeneity throughout the right lobe liver compatible with known diffuse hepatic metastases. The index lesion within the right lobe liver measured previously is more difficult to measure discretely on this exam, with subjective increase in the number of hypodense lesions consistent with progressive metastases. Persistent indwelling biliary duct stents, with no change in the intrahepatic biliary duct dilation seen on prior study. Pancreas: Unremarkable. No pancreatic ductal dilatation or surrounding inflammatory changes. Spleen: Normal in size without focal abnormality. Adrenals/Urinary Tract: Stable bilateral nonobstructing renal calculi  measuring up to 13 mm on the left and 9 mm on the right. Interval development of mild left hydronephrosis and hydroureter, without clear cause for obstruction identified. The adrenals are unremarkable. Bladder is moderately distended without filling defect. Stomach/Bowel: No bowel obstruction or ileus. The ill-defined soft tissue mass along the mid rectum is again identified, difficult to clearly visualize due to loss of fat plane between the mass and the posterior wall the uterus, measuring up to 5.4 x 3.0 cm reference image 60/2. No evidence of acute bowel inflammation. Vascular/Lymphatic: No significant vascular findings. No discrete adenopathy. Reproductive: Uterus is atrophic. There is a 6.8 x 5.7 cm fluid collection within the region of the left adnexa, unchanged since prior exam. It is unclear whether this reflects loculated fluid, peritoneal metastasis, or a left adnexal cyst. Other: Small volume abdominal ascites. Omental caking is again noted, most pronounced in the right upper quadrant, not appreciably changed. No free intraperitoneal gas. No abdominal wall hernia. Musculoskeletal: No acute or destructive bony abnormalities. Reconstructed images demonstrate no additional findings. IMPRESSION: 1. Grossly stable soft tissue mass within the mid rectum, difficult to measure accurately given loss of normal fat plane between the mass and the posterior wall the uterus, compatible with known history of malignancy. 2. Stable cystic structure within the left lower pelvis, either loculated fluid, cystic peritoneal metastases, or left adnexal cyst. 3. Interval development of mild left hydronephrosis and left hydroureter, without clear cause for obstruction identified. The obstruction could be related to extrinsic mass effect upon the distal left ureter by the cystic structure described above. 4. Stable peritoneal metastases and omental caking, most pronounced in the right upper quadrant. 5. Diffuse metastatic disease  throughout the right lobe liver, subjectively worse since prior exam. 6. Small volume ascites. 7. Bilateral nonobstructing renal calculi. Electronically Signed   By: Ozell Daring M.D.   On: 09/20/2023 17:30   DG Chest Port 1 View Result Date: 09/20/2023 CLINICAL DATA:  Generalized abdominal pain, nausea and vomiting, history of metastatic colon cancer EXAM: PORTABLE CHEST 1 VIEW COMPARISON:  06/20/2023 FINDINGS: Single frontal view of the chest demonstrates an unremarkable cardiac silhouette. No airspace disease, effusion, or pneumothorax. Left-sided PICC tip overlies superior vena cava. No acute bony abnormalities. IMPRESSION: 1. No acute intrathoracic process. Electronically Signed   By: Ozell Daring M.D.   On: 09/20/2023 16:24        Scheduled Meds:  amLODipine   10 mg Oral Daily   Chlorhexidine  Gluconate Cloth  6 each Topical Daily   enoxaparin  (LOVENOX ) injection  40 mg Subcutaneous Q24H   fentaNYL   1 patch Transdermal Q72H   HYDROmorphone    Intravenous Q4H   senna  1 tablet Oral BID   sodium chloride  flush  3 mL Intravenous Q12H   Continuous Infusions:  0.9 % NaCl with KCl 40 mEq / L 100 mL/hr at 09/20/23 2156   promethazine  (PHENERGAN ) injection (IM or IVPB)       LOS: 0 days    Time spent: 35 minutes    Jerian Morais A Lexxi Koslow, MD [facility]   If 7PM-7AM, please contact night-coverage [email or web address]  09/21/2023, 7:48 AM

## 2023-09-22 ENCOUNTER — Other Ambulatory Visit

## 2023-09-22 ENCOUNTER — Ambulatory Visit: Admitting: Nurse Practitioner

## 2023-09-22 ENCOUNTER — Encounter: Payer: Self-pay | Admitting: Oncology

## 2023-09-22 ENCOUNTER — Other Ambulatory Visit (HOSPITAL_COMMUNITY): Payer: Self-pay

## 2023-09-22 DIAGNOSIS — K5900 Constipation, unspecified: Secondary | ICD-10-CM

## 2023-09-22 DIAGNOSIS — Z79899 Other long term (current) drug therapy: Secondary | ICD-10-CM

## 2023-09-22 DIAGNOSIS — R112 Nausea with vomiting, unspecified: Secondary | ICD-10-CM | POA: Diagnosis not present

## 2023-09-22 DIAGNOSIS — Z515 Encounter for palliative care: Secondary | ICD-10-CM | POA: Diagnosis not present

## 2023-09-22 DIAGNOSIS — Z7189 Other specified counseling: Secondary | ICD-10-CM

## 2023-09-22 DIAGNOSIS — G893 Neoplasm related pain (acute) (chronic): Secondary | ICD-10-CM | POA: Diagnosis not present

## 2023-09-22 DIAGNOSIS — C19 Malignant neoplasm of rectosigmoid junction: Secondary | ICD-10-CM | POA: Diagnosis not present

## 2023-09-22 LAB — CBC
HCT: 33.7 % — ABNORMAL LOW (ref 36.0–46.0)
Hemoglobin: 10.2 g/dL — ABNORMAL LOW (ref 12.0–15.0)
MCH: 27.2 pg (ref 26.0–34.0)
MCHC: 30.3 g/dL (ref 30.0–36.0)
MCV: 89.9 fL (ref 80.0–100.0)
Platelets: 298 K/uL (ref 150–400)
RBC: 3.75 MIL/uL — ABNORMAL LOW (ref 3.87–5.11)
RDW: 18.1 % — ABNORMAL HIGH (ref 11.5–15.5)
WBC: 11 K/uL — ABNORMAL HIGH (ref 4.0–10.5)
nRBC: 0 % (ref 0.0–0.2)

## 2023-09-22 LAB — BASIC METABOLIC PANEL WITH GFR
Anion gap: 10 (ref 5–15)
BUN: <5 mg/dL — ABNORMAL LOW (ref 6–20)
CO2: 25 mmol/L (ref 22–32)
Calcium: 9 mg/dL (ref 8.9–10.3)
Chloride: 99 mmol/L (ref 98–111)
Creatinine, Ser: 0.37 mg/dL — ABNORMAL LOW (ref 0.44–1.00)
GFR, Estimated: >60 mL/min (ref >60)
Glucose, Bld: 85 mg/dL (ref 70–99)
Potassium: 4.1 mmol/L (ref 3.5–5.1)
Sodium: 134 mmol/L — ABNORMAL LOW (ref 135–145)

## 2023-09-22 MED ORDER — ENSURE PLUS HIGH PROTEIN PO LIQD
237.0000 mL | Freq: Two times a day (BID) | ORAL | Status: DC
  Administered 2023-09-22 – 2023-09-25 (×7): 237 mL via ORAL

## 2023-09-22 MED ORDER — LACTULOSE 10 GM/15ML PO SOLN
10.0000 g | Freq: Every day | ORAL | Status: DC
  Filled 2023-09-22: qty 15

## 2023-09-22 MED ORDER — LACTULOSE 10 GM/15ML PO SOLN
30.0000 g | Freq: Every day | ORAL | Status: DC
  Administered 2023-09-22 – 2023-09-25 (×2): 30 g via ORAL
  Filled 2023-09-22 (×3): qty 45

## 2023-09-22 MED ORDER — CLONIDINE HCL 0.1 MG PO TABS
0.1000 mg | ORAL_TABLET | Freq: Every day | ORAL | Status: DC
  Administered 2023-09-22 – 2023-09-24 (×3): 0.1 mg via ORAL
  Filled 2023-09-22 (×3): qty 1

## 2023-09-22 MED ORDER — SENNOSIDES-DOCUSATE SODIUM 8.6-50 MG PO TABS
3.0000 | ORAL_TABLET | Freq: Two times a day (BID) | ORAL | Status: DC
  Administered 2023-09-22: 3 via ORAL
  Administered 2023-09-23: 1 via ORAL
  Administered 2023-09-23 – 2023-09-25 (×4): 3 via ORAL
  Filled 2023-09-22 (×6): qty 3

## 2023-09-22 MED ORDER — METOPROLOL TARTRATE 50 MG PO TABS
100.0000 mg | ORAL_TABLET | Freq: Two times a day (BID) | ORAL | Status: DC
  Administered 2023-09-22 – 2023-09-25 (×6): 100 mg via ORAL
  Filled 2023-09-22 (×6): qty 2

## 2023-09-22 MED ORDER — DEXAMETHASONE SODIUM PHOSPHATE 10 MG/ML IJ SOLN
8.0000 mg | Freq: Once | INTRAMUSCULAR | Status: AC
  Administered 2023-09-22: 8 mg via INTRAVENOUS
  Filled 2023-09-22: qty 1

## 2023-09-22 MED ORDER — DEXAMETHASONE 4 MG PO TABS
4.0000 mg | ORAL_TABLET | Freq: Every day | ORAL | Status: DC
  Administered 2023-09-23 – 2023-09-25 (×3): 4 mg via ORAL
  Filled 2023-09-22 (×3): qty 1

## 2023-09-22 MED ORDER — METOPROLOL TARTRATE 25 MG PO TABS: 25.0000 mg | ORAL_TABLET | Freq: Two times a day (BID) | ORAL | Status: DC

## 2023-09-22 MED ORDER — FENTANYL 100 MCG/HR TD PT72
1.0000 | MEDICATED_PATCH | TRANSDERMAL | Status: DC
  Administered 2023-09-22: 1 via TRANSDERMAL
  Filled 2023-09-22: qty 1

## 2023-09-22 NOTE — H&P (Deleted)
 April [name]   DOB:1975/02/04   FM#:[id number]    ONCOLOGY PROGRESS NOTE:  ASSESSMENT & PLAN:  April [name] is a 49 year old female patient with oncology history significant for colon cancer.  She was admitted on 09/20/23 with complaints of severe abdominal pain, nausea, and vomiting.  Medical Oncology following closely.  Cancer related abdominal pain - Patient admitted on 09/20/2023 with worsening abdominal pain. - On IV Dilaudid  via PCA pump, continue per orders.  Palliative management of pain medication regiment appreciated. - Consideration for intrathecal pump noted.  Sigmoid colon cancer, stage IV, (pT4a,pN2b,M1c)  - Diagnosed in 2021 -Status post robotic assisted low anterior resection, mesenteric lymphadenectomy, bilateral salpingo-- oophorectomy in 2021. - Status post FOLFIRI plus bevacizumab  initiated 05/24/2019.  Bevacizumab  held from cycle 5.  Completed cycle 11 12/26/2019. - Restarted on FOLFIRI plus panitumumab  09/19/2020, completed cycle 15 06/17/2021.  Started on cycle 1 Lonsurf  and Avastin  10/14/2021.  Cycle 6 completed 03/03/2022.  Subsequently given cycle 1 FOLFOX for 02/01/2022, cycle 11 given 09/22/2022.  Patient declined further treatment. - Restarted patient on irinotecan  plus panitumumab  01/06/2023 with cycle 8 given 06/19/2023.  Irinotecan  discontinued 07/10/2023 due to lack of clinical benefit and poor tolerance. - More recently on chemotherapy regimen FOLFOX , cycle 2 started 08/26/2023.  Patient states reports that she was due to return for cycle 3 chemotherapy last week 9/4 however felt very nauseous and could not get herself out of bed. - CEA pending - Medical oncology/Dr. Cloretta following closely  Nausea/vomiting - Patient reports intermittent nausea and vomiting.  Currently feels okay.  Discussed with patient to consider taking antiemetics evening before and morning of outpatient chemotherapy to prevent feeling nauseous morning of chemotherapy. -  Continue supportive care  Anemia, normocytic - Hemoglobin 10.2 today - No transfusional intervention required at this time - Continue to monitor CBC with differential    Code Status Full   Subjective:  Patient seen awake alert and oriented x 3 sitting up in bed.  Reports that abdominal pain that is usually intense behind her navel is much better since she has been on the IV Dilaudid  PCA pump.  Also reports no rectal pressure at this time.  Denies other acute symptoms.  No acute distress is noted.  Objective:   Intake/Output Summary (Last 24 hours) at 09/22/2023 0917 Last data filed at 09/22/2023 9360 Gross per 24 hour  Intake 3052.88 ml  Output --  Net 3052.88 ml     PHYSICAL EXAMINATION: ECOG PERFORMANCE STATUS: 3 - Symptomatic, >50% confined to bed  Vitals:   09/22/23 0642 09/22/23 0845  BP: (!) 164/134   Pulse: 97   Resp: 19 13  Temp: 97.9 F (36.6 C)   SpO2: 100% 100%   Filed Weights   09/20/23 1650  Weight: 119 lb 12.8 oz (54.3 kg)    GENERAL: alert, no distress and comfortable +frail appearing SKIN: skin color, texture, turgor are normal, no rashes or significant lesions EYES: normal, conjunctiva are pink and non-injected, sclera clear OROPHARYNX: no exudate, no erythema and lips, buccal mucosa, and tongue normal  NECK: supple, thyroid normal size, non-tender, without nodularity LYMPH: no palpable lymphadenopathy in the cervical, axillary or inguinal LUNGS: clear to auscultation and percussion with normal breathing effort HEART: regular rate & rhythm and no murmurs and no lower extremity edema ABDOMEN: +Abdominal distention, no discrete mass MUSCULOSKELETAL: no cyanosis of digits and no clubbing  PSYCH: alert & oriented x 3 with fluent speech NEURO: no focal motor/sensory deficits  All questions were answered. The patient knows to call the clinic with any problems, questions or concerns.   The total time spent in the appointment was 40 minutes encounter  with patient including review of chart and various tests results, discussions about plan of care and coordination of care plan  Olam JINNY Brunner, NP 09/22/2023 9:17 AM    Labs Reviewed:  Lab Results  Component Value Date   WBC 11.0 (H) 09/22/2023   HGB 10.2 (L) 09/22/2023   HCT 33.7 (L) 09/22/2023   MCV 89.9 09/22/2023   PLT 298 09/22/2023   Recent Labs    12/16/22 0505 12/17/22 0500 06/21/23 0539 06/22/23 0415 09/16/23 1335 09/20/23 1524 09/21/23 0612 09/21/23 1011 09/22/23 0352  NA 132*   < > 133*   < > 134* 131* 137 135 134*  K 2.9*   < > 3.4*   < > 2.7* 2.7* >7.5* 3.3* 4.1  CL 97*   < > 95*   < > 93* 87* 105 93* 99  CO2 29   < > 29   < > 30 27 25 31 25   GLUCOSE 97   < > 83   < > 88 73 80 119* 85  BUN 8   < > 7   < > <5* 5* <5* <5* <5*  CREATININE 0.52   < > 0.37*   < > 0.48 0.40* 0.38* 0.51 0.37*  CALCIUM  8.5*   < > 8.5*   < > 9.3 9.2 7.8* 8.9 9.0  GFRNONAA >60   < > >60   < > >60 >60 >60 >60 >60  PROT 7.1   < > 6.9   < > 8.8* 8.9* 7.6  --   --   ALBUMIN 3.2*   < > 2.6*   < > 3.4* 3.4* 2.9*  --   --   AST 37   < > 25   < > 59* 50* 51*  --   --   ALT 28   < > 16   < > 26 23 17   --   --   ALKPHOS 204*   < > 606*   < > 710* 624* 528*  --   --   BILITOT 0.5   < > 0.7   < > 0.7 0.9 0.8  --   --   BILIDIR 0.1  --  <0.1  --   --   --  0.4*  --   --   IBILI 0.4  --  NOT CALCULATED  --   --   --  0.4  --   --    < > = values in this interval not displayed.    Studies Reviewed:  CT ABDOMEN PELVIS W CONTRAST Result Date: 09/20/2023 CLINICAL DATA:  Abdominal pain, history of metastatic colon cancer, nausea and vomiting EXAM: CT ABDOMEN AND PELVIS WITH CONTRAST TECHNIQUE: Multidetector CT imaging of the abdomen and pelvis was performed using the standard protocol following bolus administration of intravenous contrast. RADIATION DOSE REDUCTION: This exam was performed according to the departmental dose-optimization program which includes automated exposure control, adjustment of the  mA and/or kV according to patient size and/or use of iterative reconstruction technique. CONTRAST:  OMNIPAQUE  IOHEXOL  300 MG/ML  SOLN COMPARISON:  08/02/2023 FINDINGS: Lower chest: No acute pleural or parenchymal lung disease. Hepatobiliary: Marked heterogeneity throughout the right lobe liver compatible with known diffuse hepatic metastases. The index lesion within the right lobe liver measured previously is more difficult  to measure discretely on this exam, with subjective increase in the number of hypodense lesions consistent with progressive metastases. Persistent indwelling biliary duct stents, with no change in the intrahepatic biliary duct dilation seen on prior study. Pancreas: Unremarkable. No pancreatic ductal dilatation or surrounding inflammatory changes. Spleen: Normal in size without focal abnormality. Adrenals/Urinary Tract: Stable bilateral nonobstructing renal calculi measuring up to 13 mm on the left and 9 mm on the right. Interval development of mild left hydronephrosis and hydroureter, without clear cause for obstruction identified. The adrenals are unremarkable. Bladder is moderately distended without filling defect. Stomach/Bowel: No bowel obstruction or ileus. The ill-defined soft tissue mass along the mid rectum is again identified, difficult to clearly visualize due to loss of fat plane between the mass and the posterior wall the uterus, measuring up to 5.4 x 3.0 cm reference image 60/2. No evidence of acute bowel inflammation. Vascular/Lymphatic: No significant vascular findings. No discrete adenopathy. Reproductive: Uterus is atrophic. There is a 6.8 x 5.7 cm fluid collection within the region of the left adnexa, unchanged since prior exam. It is unclear whether this reflects loculated fluid, peritoneal metastasis, or a left adnexal cyst. Other: Small volume abdominal ascites. Omental caking is again noted, most pronounced in the right upper quadrant, not appreciably changed. No free  intraperitoneal gas. No abdominal wall hernia. Musculoskeletal: No acute or destructive bony abnormalities. Reconstructed images demonstrate no additional findings. IMPRESSION: 1. Grossly stable soft tissue mass within the mid rectum, difficult to measure accurately given loss of normal fat plane between the mass and the posterior wall the uterus, compatible with known history of malignancy. 2. Stable cystic structure within the left lower pelvis, either loculated fluid, cystic peritoneal metastases, or left adnexal cyst. 3. Interval development of mild left hydronephrosis and left hydroureter, without clear cause for obstruction identified. The obstruction could be related to extrinsic mass effect upon the distal left ureter by the cystic structure described above. 4. Stable peritoneal metastases and omental caking, most pronounced in the right upper quadrant. 5. Diffuse metastatic disease throughout the right lobe liver, subjectively worse since prior exam. 6. Small volume ascites. 7. Bilateral nonobstructing renal calculi. Electronically Signed   By: Ozell Daring M.D.   On: 09/20/2023 17:30   DG Chest Port 1 View Result Date: 09/20/2023 CLINICAL DATA:  Generalized abdominal pain, nausea and vomiting, history of metastatic colon cancer EXAM: PORTABLE CHEST 1 VIEW COMPARISON:  06/20/2023 FINDINGS: Single frontal view of the chest demonstrates an unremarkable cardiac silhouette. No airspace disease, effusion, or pneumothorax. Left-sided PICC tip overlies superior vena cava. No acute bony abnormalities. IMPRESSION: 1. No acute intrathoracic process. Electronically Signed   By: Ozell Daring M.D.   On: 09/20/2023 16:24   Sigmoid colon cancer, stage IV (pT4a,pN2b,M1c) Colonoscopy 03/24/2019-3 rectal polyps-hyperplastic polyps, distal colon biopsy-at least intramucosal adenocarcinoma, completely obstructing mass in the distal sigmoid colon, could not be traversed, intact mismatch repair protein  expression 03/24/2019-CEA 56.1 03/29/2019 CT abdomen/pelvis-circumferential thickening involving the entire mid and distal sigmoid colon to the level of the rectosigmoid junction, solid/cystic mass in the left ovary, bilateral nephrolithiasis Robotic assisted low anterior resection, mesenteric lymphadenectomy, bilateral salpingo-oophorectomy 04/08/2019 Pathology ([facility] review of outside pathology) metastatic adenocarcinoma involving the peritoneum overlying the round ligament, serosa of the urinary bladder, serosa of the right ureter a sacral area, pelvic peritoneum, and left ovary.  Omental biopsy with focal mucin pools with no carcinoma cells identified, right hemidiaphragm biopsy involved by metastatic adenocarcinoma, invasive adenocarcinoma the sigmoid colon, moderately differentiated,  T4a, perineural and vascular invasion present, 7/22 lymph nodes, multiple tumor deposits, resection margins negative Negative for PD-L1, low probability of MSI-high, HER-2 negative, negative for BRAF, NRAS and KRAS alterations CTs 05/19/2019-no evidence of metastatic disease, findings suspicious for colitis of the transverse and ascending colon, small amount of ascites in the cul-de-sac, bilateral renal calculi Cycle 1 FOLFIRI 05/24/2019, bevacizumab  added with cycle 2 Cycle 4 FOLFIRI/bevacizumab  07/05/2019 Cycle 5 FOLFIRI 07/27/2019, bevacizumab  held secondary to hypertension Cycle 6 FOLFIRI 08/10/1999, bevacizumab  held secondary to hypertension CTs 08/30/2019-no evidence of recurrent disease, new subsolid right lower lobe nodule felt to be inflammatory, emphysema Cycle 7 FOLFIRI 09/26/2019, bevacizumab  remains on hold secondary to hypertension Cycle 8 FOLFIRI 10/17/2019, bevacizumab  held secondary to hypertension, Udenyca  added for neutropenia Cycle 9 FOLFIRI 11/07/2019, bevacizumab  held secondary to hypertension, Udenyca  Cycle 10 FOLFIRI 11/28/2019, bevacizumab  held, Udenyca  Cycle 11 FOLFIRI 12/26/2019, bevacizumab  held,  Udenyca  CTs 01/25/2020-no evidence of recurrent disease, resolution of right lower lobe nodule Maintenance Xeloda  beginning 02/06/2020 CTs 04/25/2020- no evidence of metastatic disease, multiple bilateral renal calculi without hydronephrosis Maintenance Xeloda  continued CT abdomen/pelvis without contrast 08/10/2020-bilateral staghorn renal calculi, no evidence of metastatic disease; addendum 08/23/2020-small but increasing omental nodules. Cycle 1 FOLFIRI/panitumumab  09/19/2020 Cycle 2 FOLFIRI/Panitumumab  10/03/2020 Cycle 3 FOLFIRI/Panitumumab  10/17/2020 Cycle 4 FOLFIRI/panitumumab  10/31/2020 Cycle 5 FOLFIRI/Panitumumab  11/14/2020 CTs 11/26/2020-stable omental metastases compared to 10/22/2020, mildly decreased from 08/10/2020.  Left lower quadrant tiny paracolic gutter implant slightly decreased from CT 08/10/2020.  No new or progressive metastatic disease in the abdomen or pelvis. Cycle 6 FOLFIRI/Panitumumab  11/28/2020, irinotecan  dose reduced, treatment schedule adjusted to every 3 weeks going forward Cycle 7 FOLFIRI/panitumumab  12/24/2020 Cycle 8 FOLFIRI/Panitumumab  01/16/2021--treatment held due to hypertension in the infusion area. Cycle 8 FOLFIRI/panitumumab  01/21/2021 Cycle 9 FOLFIRI/panitumumab  02/11/2021 Cycle 10 FOLFIRI/Panitumumab  03/04/2021 Cycle 11 FOLFIRI/Panitumumab  03/25/2021 CT abdomen/pelvis 04/10/2021-stable right omental and left posterior paracolic gutter nodules Cycle 12 FOLFIRI/panitumumab  04/15/2021 Cycle 13 FOLFIRI/panitumumab  05/06/2021 Cycle 14 FOLFIRI/Panitumumab  05/27/2021 Cycle 15 FOLFIRI/Panitumumab  06/17/2021 CT abdomen/pelvis 07/05/2021-slight enlargement of a dominant right omental implant, no new implants Patient requested a treatment break CT abdomen/pelvis 10/04/2021-mild increase in size of multiple omental soft tissue nodules, 4 mm calculus in the distal left ureter adjacent to the ureteral stent Cycle 1 Lonsurf  10/14/2021 10/28/2021 Avastin  every 2 weeks Cycle 2 Lonsurf   11/11/2021 Cycle 3 Lonsurf  12/09/2021 Cycle 4 Lonsurf  01/06/2022 Avastin  held 01/20/2022 due to proteinuria, 24-hour urine 43 mg CT/pelvis 01/30/2022-possible new peritoneal implant at the transverse colon, no significant change in other omental/peritoneal implants, stable chronic rectal wall thickening, status post removal of double-J left ureteral stent with no evidence of hydronephrosis or ureteral calculus, nonobstructing bilateral renal calculi Cycle 5 Lonsurf  02/05/2022 or 02/06/2022 Avastin  every 2 weeks Cycle 6 Lonsurf  03/03/2022 CTs 04/05/2022-increase in size and number of peritoneal implants compared to January 2024 central liver mass narrowing the anterior branch of the right portal vein, decreased biliary duct dilation, biliary stents in place, stable right cardiophrenic lymph nodes, separate pigtail stent in the lumen of the proximal duodenum Cycle 1 FOLFOX 04/14/2022 CT abdomen/pelvis 04/16/2022-stent within the third portion of the duodenum, stable peritoneal nodules, indwelling biliary stents with mild left biliary duct dilation Cycle 2 FOLFOX 04/28/2022, Emend and prophylactic dexamethasone  added Cycle 3 FOLFOX 05/12/2022, Aloxi , Emend, prophylactic dexamethasone , Compazine  and lorazepam  as needed Chemotherapy held 05/25/2020 due to severe hypertension, evaluated in the emergency department Cycle 4 FOLFOX 06/03/2022 Cycle 5 FOLFOX 06/16/2022, oxaliplatin  dose reduced and 5-FU bolus eliminated due to neutropenia CT 06/25/2022-unchanged soft tissue at the hepatic hilum,  unchanged, bile duct stent, decreased size of peritoneal and omental nodules and diminished volume of likely fluid in the lower left pelvis Cycle 6 FOLFOX 06/30/2022 Cycle 7 FOLFOX 07/21/2022 CT 07/24/2022 (in the emergency department with nausea, headache, abdominal pain)-no acute pathology.  No significant interval change in omental nodularity. Cycle 8 FOLFOX 08/04/2022, pruritus on the palms at the end of the oxaliplatin  infusion,  Pepcid  and Benadryl  given, symptoms resolved, infusion not resumed Cycle 9 FOLFOX 08/18/2022-Pepcid  and Benadryl  added to premedications, Oxaliplatin  diluted in a larger volume and infusion time increased Cycle 10 FOLFOX 09/08/2022 Cycle 11 FOLFOX 09/22/2022 10/06/2022 patient declined treatment CTs 10/15/2022-similar soft tissue fullness in the hepatic hilum with biliary stents in place, decrease in omental/peritoneal metastases Maintenance capecitabine  10/26/2022 CT abdomen/pelvis 11/19/2022-mild increase in tumor studding at the left paracolic gutter and omentum, circumferential wall thickening of the rectum, biliary stents in place with minimal increase in intrahepatic biliary dilation, nonobstructive renal calculi Colonoscopy 11/23/2022-anastomosis at 15 cm from the anal verge-patent, 2 cm polypoid tissue at the anastomosis biopsied, invasive adenocarcinoma moderately differentiated, preserved expression of major MMR proteins CTs 12/15/2022: Possible developing nodule in the lateral left liver, increased omental nodularity, persistent wall thickening in the upper rectum CT abdomen/pelvis 12/24/2022: Increased thickened fold in the transverse colon-infectious/inflammatory, 2.1 cm lateral left liver lesion, potential new 5 mm segment 5 lesion, stable biliary stents with intrahepatic biliary dilation, central biliary wall thickening near the hepatic hilum, multiple peritoneal/omental implants unchanged from 12/15/2022, but likely larger than 11/19/2022 Cycle 1 irinotecan /Panitumumab  01/06/2023 Cycle 2 irinotecan /Panitumumab  01/20/2023 CTs 01/24/2023-slight increase in omental nodularity. Cycle 3 irinotecan /Panitumumab  02/03/2023 Cycle 4 irinotecan /panitumumab  02/17/2023 CTs abdomen/pelvis 02/24/2023-subtly hypoattenuating lesion within the peripheral aspect of hepatic segment 2 redemonstrated.  Additional scattered subcentimeter low-density lesions within the liver too small to characterize.  Prominent intrahepatic  biliary dilatation with biliary stents in place.  Similar degree of nodularity along the omentum and left paracolic gutter.  No ascites. CTs 03/09/2023-indwelling endoscopically placed stents; level of intrahepatic biliary duct dilatation is slightly improved on the left and some on the right.  Gallbladder is dilated with wall edema which is increasing.  More nodular tissue extending up along the porta hepatis.  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  Rectum with nodular wall thickening with extension into the adjacent tissue, nodular areas seen extending perirectal and presacral spaces.  Peritoneal carcinomatosis again identified. Irinotecan /Panitumumab  04/02/2023 Irinotecan /panitumumab  04/16/2023 Irinotecan /Panitumumab  05/07/2023 Irinotecan /Panitumumab  06/19/2023 CT abdomen/pelvis 06/21/2023-stable heterogeneous enhancement throughout the liver with nodularity along the surface of the liver.  Small amount of perihepatic ascites.  Stable nodularity right abdomen.  Circumferential rectal wall thickening/mass unchanged. Irinotecan /Panitumumab  discontinued 07/10/2023 due to lack of clinical benefit/poor tolerance Appointment with Dr. Sheilda 07/23/2023 CTs 08/02/2023-mid rectal soft tissue mass, mildly progressive.  Right hepatic metastases, progressive.  Peritoneal disease/omental caking stable versus mildly progressive.  Small volume abdominopelvic ascites mildly progressive. Cycle 1 FOLFOX 08/05/2023 Cycle 2 FOLFOX 08/20/2023 (Pepcid  and Benadryl  daily x 2 days before treatment, Pepcid  continued for 2 days post oxaliplatin ; oxaliplatin  administration time increased)    Hypertension G4 P3, twins Kidney stones-bilateral staghorn renal calculi on CT 08/10/2020 Infected Port-A-Cath 09/12/2019-placed on Augmentin , referred for Port-A-Cath removal; Port-A-Cath removed 09/14/2019; PICC line placed 09/14/2019; culture staph aureus.  Course of Septra  completed. Neutropenia secondary to chemotherapy-Udenyca  added with  cycle 8 FOLFIRI Right nephrostomy tube 09/12/2020; stent placed 10/09/2020, percutaneous nephrostolithotomy treatment of right-sided kidney stones 10/09/2020, malpositioned right ureter stent replacement 10/23/2020, right ureter stent removed 10/29/2020 Percutaneous left nephrostolithotomy 10/01/2021-no renal  stone identified, left ureter stent left in place Cystoscopy 10/14/2021-stent removed 8.  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  2 moderate biliary strictures found in the hepatic duct system, malignant appearing.  Left and right hepatic duct and all intrahepatic branches were moderately dilated secondary to the strictures.  The strictures were dilated.  Plastic stent placed into the left hepatic duct and right hepatic duct. 9.  Admission 04/16/2022 with nausea/vomiting and abdominal pain, felt to be acute toxicity related to chemotherapy, discharged home 04/18/2022 10.  Admission 11/19/2022 with nausea/vomiting and abdominal pain-improved 11.  Admission 12/15/2022 with fever and right submandibular swelling/tenderness CT neck 12/15/2022: 2 mm distal right submandibular duct calculus with mild inflammatory change adjacent to the right submandibular gland 12.  Admission 12/24/2022 with a fever nausea/vomiting, diarrhea, Klebsiella bacteremia completed course of Augmentin  13.  Admission with abdominal pain and  nausea/vomiting 03/09/2023 through 03/14/2023-concern for gallbladder inflammation.  Not felt to be a surgical candidate.  Percutaneous cholecystostomy tube placed 03/11/2023.  Tube removed 04/24/2023. 14.  Admission 06/20/2023 with abdominal pain and constipation, pain improved partially following a bowel movement 15.  Admission 08/02/2023 with abdominal pain, cycle 1 salvage FOLFOX 08/05/2023 16.  Admission 09/20/2023 with nausea and abdominal pain   Ms. Gerhold is well-known to me with a history of metastatic colon cancer.  She is currently being treated with salvage FOLFOX chemotherapy, last given on 08/26/2023.  She has tolerated the FOLFOX without signs of an allergic reaction.  She was scheduled for a third cycle of FOLFOX 09/17/2023, but developed nausea and increased abdominal pain and did not return for treatment.  She presents to emergency room 09/20/2023 with increased abdominal pain and nausea.  She was admitted for further evaluation. A CT abdomen/pelvis 09/20/2023 revealed no acute change.  There was concern for progressive right liver.  I reviewed the CT images and I see no clear evidence of disease progression.  There is persistent evidence of carcinomatosis and mild left hydronephrosis.  She has a history of kidney stones and underwent a lithotripsy procedure for sided stones in 2022.  She has completed 2 cycles of salvage FOLFOX and it is unclear whether she has progressive disease.  However her pain does not appear improved.  We will check a CEA.  We will plan for cycle 3 FOLFOX to be given as an outpatient.  Pain control has been a challenge in her case.  She has intermittent exacerbations of pain.  She is followed up palliative care service and is being scheduled for placement of an intrathecal pump.  I discussed the poor overall prognosis with Ms. Polka.  We discussed hospice care.  Systemic treatment options are very limited if she develops progressive disease on FOLFOX.  She does not wish  to consider hospice and would like to remain on a full CODE STATUS.  Recommendations: Pain management per the palliative care service Management of hypertension per the medical service, she has been on clonidine  in the past Check CEA Plan for outpatient FOLFOX cycle 3 Continue  discussions regarding goals of care and CODE STATUS Bowel regimen Outpatient follow-up at the Cancer center

## 2023-09-22 NOTE — Consult Note (Addendum)
 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  Imaging obtained demonstrated new mild left hydroureteronephrosis without clear etiology and suspected increase in metastatic disease burden in liver.  Palliative medicine team consulted to assist with pain management. Of note, patient well-known to palliative medicine team from prior hospitalizations and follows with outpatient PMT at [facility].   Extensive review of EMR including recent documentation from hospitalist.  Review of recent BMP noted GFR estimated over 60; monitoring for medication use and adjustments. At time of EMR review in past 24 hours patient has received IV Dilaudid  PCA 0.6 bolus dosing x 45 doses. Patient continues to receive fentanyl  patch 50 mcg/h every 72 hours. Discussed care with outpatient PMT provider to coordinate care.  Patient having difficulties obtaining pain medications due to insurance coverage.  Reported insurance will only cover oral Dilaudid  as needed 6 times in 24 hours.  Have completed prior authorizations for fentanyl  patch though patient has not obtained recently.  Has been plan to get patient in for pain pump placement to help with pain management though this cannot be scheduled until October.  Presented to bedside to see patient.  Introduced myself as a member of the palliative medicine team;  patient remembers this provider from prior hospitalizations.  Discussed patient's pain management at that time.  Patient continues to have worsening abdominal pain in the setting of her metastatic cancer.  Concern on imaging for possible progression of cancer in liver.  Patient has been using IV Dilaudid  PCA at this time.  Patient does feel PCA to assist with management.  Discussed based on patient's OME requirements from PCA in past 24 hours, can appropriately increase fentanyl  patch to 100 mcg/h every 72 hours.  Also discussed with keep patient on PCA to allow fentanyl  to get into her system during the day today.  Plan would be to increase long-acting pain medication so hopefully patient can get back onto as needed oral Dilaudid .   Also discussed would start patient on steroids to assist with likely inflammatory management.  Noted would inform oncology of this to coordinate care. Patient voiced great frustration with not having coverage for pain medications.  Noted that outpatient PMT provider has been working to complete prior authorizations for pain medications.  Empathized with difficult situation about insurance not covering pain medications.  Discussed how patient's insurance at this time only covering up to 6 doses of as needed oral Dilaudid  a day. Expressed how outpatient PMT provider is also working to complete prior authorizations for fentanyl  patch.  Patient has been tried on multiple medications previously to assist with pain management so limited choices at this time. Patient wanting pain pump placed at this time.  Discussed how this is an outpatient procedure and patient is not scheduled for this procedure until October, which is first available time.  Patient is on a wait list for cancellations too and attempt to get her in sooner.  Also discussed  patient's constipation which worsens her abdominal pain.  Patient notes she has not had a bowel movement since Thursday, 9/4.  Patient notes she  normally takes lactulose  40 mg in the morning and 15 mg at night if needed.  Patient does not want to take more dosing than this.  Discussed will place patient back on lactulose  30 mg in the day and 15 mg at night.  Noted patient can refuse medication if she is having frequent bowel movements still must take medications regularly.  Also discussed increasing patient's senna to assist with regular bowel movements.  Patient noted that she had so many bowel movements previously with taking regular lactulose  that she took Imodium.  Expressed to patient that she cannot take Imodium due to her severe constipation issues which worsen her pain.  Still patient is having frequent bowel movements, this scheduled bowel regimen needs to be adjusted, she does not need to take Imodium to manage diarrhea.  Patient acknowledges this.  Patient has continued to express that she wants her pain managed and wants to live.  Patient has wanted to except any cancer directed therapies if offered.  Oncology following to provide recommendations.  All questions answered at that time.  Pallan medicine team continuing to follow patient's medical journey.  Discussed care with hospitalist, RN, oncology, and outpatient PMT provider to coordinate care.  Primary Diagnoses  Present on Admission:  Malignant neoplasm of rectosigmoid (colon) (HCC)  Hypertensive urgency  Hyponatremia  Hypokalemia  Intractable nausea and vomiting   Palliative Review of Systems: abdominal pain  Past Medical History:  Diagnosis Date   Ascending aortic aneurysm (HCC) 12/16/2022   Benign essential hypertension 10/06/2017   Last Assessment & Plan:  Formatting of this note might be different from the original. Patient's blood pressure noted to be elevated at today's visit 188/98.  Patient will be provided with antihypertensives in the treatment center if needed in order to meet her Avastin  parameters.  Patient recommended to follow up with her primary care  physician regarding medication management.  Patient is not cur   Cancer [facility])    Change in bowel habits 06/28/2022   Colon cancer (HCC)    History of colon cancer 06/28/2022   Hypertension    Prolonged QT interval 01/07/2018   Renal calculi    bilateral   Sepsis due to undetermined organism (HCC) 12/24/2022   Sialoadenitis of submandibular gland 12/16/2022   Social History   Socioeconomic History   Marital status: Single    Spouse name: Not on file   Number of children: Not on file   Years of education: Not on file   Highest education level: Not on file  Occupational History   Not on file  Tobacco Use   Smoking status: Former    Types: Cigarettes   Smokeless tobacco: Never   Tobacco comments:    Quit 1 month ago   Vaping Use   Vaping status: Never Used  Substance and Sexual Activity   Alcohol use: Not Currently   Drug use: Not Currently    Types: Marijuana   Sexual activity: Not Currently  Other Topics Concern   Not on file  Social History Narrative   Not on file   Social Drivers of Health   Financial Resource Strain: Low Risk  (11/11/2022)   Overall Financial Resource Strain (CARDIA)    Difficulty of Paying Living Expenses: Not hard at all  Food Insecurity: No Food Insecurity (09/20/2023)   Hunger Vital Sign  Worried About Programme researcher, broadcasting/film/video in the Last Year: Never true    Ran Out of Food in the Last Year: Never true  Transportation Needs: No Transportation Needs (09/20/2023)   PRAPARE - Administrator, Civil Service (Medical): No    Lack of Transportation (Non-Medical): No  Physical Activity: Inactive (11/11/2022)   Exercise Vital Sign    Days of Exercise per Week: 0 days    Minutes of Exercise per Session: 0 min  Stress: No Stress Concern Present (11/11/2022)   [employer] of Occupational Health - Occupational Stress Questionnaire    Feeling of Stress : Not at all  Social Connections: Socially Isolated (01/24/2023)   Social Connection and  Isolation Panel    Frequency of Communication with Friends and Family: More than three times a week    Frequency of Social Gatherings with Friends and Family: More than three times a week    Attends Religious Services: Never    Database administrator or Organizations: No    Attends Engineer, structural: Never    Marital Status: Never married   Family History  Problem Relation Age of Onset   Hypertension Mother    Diabetes Mellitus II Father    Stroke Father    Ulcers Sister    Colon cancer Neg Hx    Esophageal cancer Neg Hx    Inflammatory bowel disease Neg Hx    Liver disease Neg Hx    Pancreatic cancer Neg Hx    Rectal cancer Neg Hx    Stomach cancer Neg Hx    Scheduled Meds:  amLODipine   10 mg Oral Daily   Chlorhexidine  Gluconate Cloth  6 each Topical Daily   enoxaparin  (LOVENOX ) injection  40 mg Subcutaneous Q24H   fentaNYL   1 patch Transdermal Q72H   HYDROmorphone    Intravenous Q4H   senna-docusate  1 tablet Oral BID   sodium chloride  flush  3 mL Intravenous Q12H   Continuous Infusions:  0.9 % NaCl with KCl 40 mEq / L 100 mL/hr at 09/22/23 9360   promethazine  (PHENERGAN ) injection (IM or IVPB)     PRN Meds:.acetaminophen  **OR** acetaminophen , bisacodyl , diphenhydrAMINE  **OR** diphenhydrAMINE , labetalol , naloxone  **AND** sodium chloride  flush, polyethylene glycol, prochlorperazine , promethazine  (PHENERGAN ) injection (IM or IVPB), sodium chloride  flush Allergies  Allergen Reactions   Lisinopril Swelling and Other (See Comments)    Lip swelling and Angioedema   Irinotecan  Other (See Comments)    Stomach cramping Patient given 0.5 mL atropine  IV and was able to continue infusion.  See Progress note on 10/03/20.    Leucovorin  Calcium  Itching and Other (See Comments)    Patient reported itchy palms towards end of transfusion, see progress note 08/04/22.    Oxaliplatin  Itching and Other (See Comments)    Patient reported itchy palms towards end of transfusion, see  progress note on 08/04/22.   CBC:    Component Value Date/Time   WBC 11.0 (H) 09/22/2023 0352   HGB 10.2 (L) 09/22/2023 0352   HGB 10.5 (L) 09/16/2023 1335   HCT 33.7 (L) 09/22/2023 0352   PLT 298 09/22/2023 0352   PLT 339 09/16/2023 1335   MCV 89.9 09/22/2023 0352   NEUTROABS 9.6 (H) 09/20/2023 1524   LYMPHSABS 1.8 09/20/2023 1524   MONOABS 0.9 09/20/2023 1524   EOSABS 0.0 09/20/2023 1524   BASOSABS 0.0 09/20/2023 1524   Comprehensive Metabolic Panel:    Component Value Date/Time   NA 134 (L) 09/22/2023 0352   K  4.1 09/22/2023 0352   CL 99 09/22/2023 0352   CO2 25 09/22/2023 0352   BUN <5 (L) 09/22/2023 0352   CREATININE 0.37 (L) 09/22/2023 0352   CREATININE 0.48 09/16/2023 1335   GLUCOSE 85 09/22/2023 0352   CALCIUM  9.0 09/22/2023 0352   AST 51 (H) 09/21/2023 0612   AST 59 (H) 09/16/2023 1335   ALT 17 09/21/2023 0612   ALT 26 09/16/2023 1335   ALKPHOS 528 (H) 09/21/2023 0612   BILITOT 0.8 09/21/2023 0612   BILITOT 0.7 09/16/2023 1335   PROT 7.6 09/21/2023 0612   ALBUMIN 2.9 (L) 09/21/2023 0612    Physical Exam: Vital Signs: BP (!) 164/134 (BP Location: Right Arm)   Pulse 97   Temp 97.9 F (36.6 C) (Oral)   Resp 19   Ht 5' 4 (1.626 m)   Wt 54.3 kg   LMP 01/07/2006   SpO2 100%   BMI 20.56 kg/m  SpO2: SpO2: 100 % O2 Device: O2 Device: Room Air O2 Flow Rate:   Intake/output summary:  Intake/Output Summary (Last 24 hours) at 09/22/2023 0732 Last data filed at 09/22/2023 9360 Gross per 24 hour  Intake 3292.88 ml  Output --  Net 3292.88 ml   LBM: Last BM Date :  (last week per patient) Baseline Weight: Weight: 54.3 kg Most recent weight: Weight: 54.3 kg  General: NAD, alert, frail, cachectic Cardiovascular: RRR Respiratory: no increased work of breathing noted, not in respiratory distress Abdomen: distended Neuro: A&Ox4, following commands easily Psych: appropriately answers all questions, tearful, anxious          Palliative Performance Scale: 40%               Additional Data Reviewed: Recent Labs    09/21/23 0612 09/21/23 1011 09/22/23 0352  WBC 9.4  --  11.0*  HGB 9.2*  --  10.2*  PLT 293  --  298  NA 137 135 134*  BUN <5* <5* <5*  CREATININE 0.38* 0.51 0.37*    Imaging: CT ABDOMEN PELVIS W CONTRAST CLINICAL DATA:  Abdominal pain, history of metastatic colon cancer, nausea and vomiting  EXAM: CT ABDOMEN AND PELVIS WITH CONTRAST  TECHNIQUE: Multidetector CT imaging of the abdomen and pelvis was performed using the standard protocol following bolus administration of intravenous contrast.  RADIATION DOSE REDUCTION: This exam was performed according to the departmental dose-optimization program which includes automated exposure control, adjustment of the mA and/or kV according to patient size and/or use of iterative reconstruction technique.  CONTRAST:  OMNIPAQUE  IOHEXOL  300 MG/ML  SOLN  COMPARISON:  08/02/2023  FINDINGS: Lower chest: No acute pleural or parenchymal lung disease.  Hepatobiliary: Marked heterogeneity throughout the right lobe liver compatible with known diffuse hepatic metastases. The index lesion within the right lobe liver measured previously is more difficult to measure discretely on this exam, with subjective increase in the number of hypodense lesions consistent with progressive metastases. Persistent indwelling biliary duct stents, with no change in the intrahepatic biliary duct dilation seen on prior study.  Pancreas: Unremarkable. No pancreatic ductal dilatation or surrounding inflammatory changes.  Spleen: Normal in size without focal abnormality.  Adrenals/Urinary Tract: Stable bilateral nonobstructing renal calculi measuring up to 13 mm on the left and 9 mm on the right. Interval development of mild left hydronephrosis and hydroureter, without clear cause for obstruction identified. The adrenals are unremarkable. Bladder is moderately distended without  filling defect.  Stomach/Bowel: No bowel obstruction or ileus. The ill-defined soft tissue mass along the mid  rectum is again identified, difficult to clearly visualize due to loss of fat plane between the mass and the posterior wall the uterus, measuring up to 5.4 x 3.0 cm reference image 60/2. No evidence of acute bowel inflammation.  Vascular/Lymphatic: No significant vascular findings. No discrete adenopathy.  Reproductive: Uterus is atrophic. There is a 6.8 x 5.7 cm fluid collection within the region of the left adnexa, unchanged since prior exam. It is unclear whether this reflects loculated fluid, peritoneal metastasis, or a left adnexal cyst.  Other: Small volume abdominal ascites. Omental caking is again noted, most pronounced in the right upper quadrant, not appreciably changed. No free intraperitoneal gas. No abdominal wall hernia.  Musculoskeletal: No acute or destructive bony abnormalities. Reconstructed images demonstrate no additional findings.  IMPRESSION: 1. Grossly stable soft tissue mass within the mid rectum, difficult to measure accurately given loss of normal fat plane between the mass and the posterior wall the uterus, compatible with known history of malignancy. 2. Stable cystic structure within the left lower pelvis, either loculated fluid, cystic peritoneal metastases, or left adnexal cyst. 3. Interval development of mild left hydronephrosis and left hydroureter, without clear cause for obstruction identified. The obstruction could be related to extrinsic mass effect upon the distal left ureter by the cystic structure described above. 4. Stable peritoneal metastases and omental caking, most pronounced in the right upper quadrant. 5. Diffuse metastatic disease throughout the right lobe liver, subjectively worse since prior exam. 6. Small volume ascites. 7. Bilateral nonobstructing renal calculi.  Electronically Signed   By: Ozell Daring M.D.    On: 09/20/2023 17:30 DG Chest Port 1 View CLINICAL DATA:  Generalized abdominal pain, nausea and vomiting, history of metastatic colon cancer  EXAM: PORTABLE CHEST 1 VIEW  COMPARISON:  06/20/2023  FINDINGS: Single frontal view of the chest demonstrates an unremarkable cardiac silhouette. No airspace disease, effusion, or pneumothorax. Left-sided PICC tip overlies superior vena cava. No acute bony abnormalities.  IMPRESSION: 1. No acute intrathoracic process.  Electronically Signed   By: Ozell Daring M.D.   On: 09/20/2023 16:24    I personally reviewed recent imaging.   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  Imaging obtained demonstrated new mild left hydroureteronephrosis without clear etiology and suspected increase in metastatic disease burden in liver.  Palliative medicine team consulted to assist with pain management. Of note, patient well-known to palliative medicine team from prior hospitalizations and follows with outpatient PMT at [facility].   # Complex medical decision making/goals of care  - Patient has continued to express that she wants a pain controlled while wanting to live.  Patient continues to take cancer directed therapies that are offered.  Oncology following to provide recommendations.  -  Code Status: Full Code   # Symptom management Patient is receiving these palliative interventions for symptom management with an intent to improve quality of life.   - Pain, severe acute on chronic pain in setting of stage IV colorectal  cancer Within the past 24 hours hours, patient has required 27 mg of IV Dilaudid  via PCA for opioid management. Based on OMEs calculated for this dose, which  would be approximately 169 OME when reducing by 50% for incomplete cross tolerance, will appropriately start patient on the listed regimen below.    - Increase fentanyl  patch to 100 mcg/h every 72 hours   - Continue IV Dilaudid  PCA with bolus only dosing of 0.6 mg every 10 minutes as needed.   - Give one-time dose of IV dexamethasone  8 mg today.   - Start oral dexamethasone  4 mg daily on 09/23/2023. - Outpatient PMTprovider has been assisting with prior authorizations for pain medications.  Insurance will only cover patient taking oral Dilaudid  6 times a day.  Patient is not scheduled for pain pump placement until October; patient on waiting list for cancellation.   - Constipation, acute on chronic No bowel obstruction noted on CT abdomen 09/20/2023.   - Start lactulose  30 mg daily   - Start lactulose  10 mg at bedtime    - Increase senna 3 tab twice daily  # Psycho-social/Spiritual Support:  - Support System: Mother- Parthenia  # Discharge Planning:  To Be Determined  Thank you for allowing the palliative care team to participate in the care [facility]  Tinnie Radar, DO Palliative Care Provider PMT # [phone]  If patient remains symptomatic despite maximum doses, please call PMT at [phone] between 0700 and 1900. Outside of these hours, please call attending, as PMT does not have night coverage.  Billing based on MDM: High  Problems Addressed: One or more chronic illnesses with severe exacerbation, progression, or side effects of treatment.  Risks: Parenteral controlled substances

## 2023-09-22 NOTE — Progress Notes (Addendum)
 PROGRESS NOTE    April [name]  FMW:[id number] DOB: 1975/02/28 DOA: 09/20/2023 PCP: Oris Camie BRAVO, NP   Brief Narrative: 48 year old with past medical history significant for stage IV colorectal cancer with metastasis, hypertension, cancer related chronic pain who presented with increased abdominal pain nausea and vomiting.  Patient is undergoing treatment of metastatic colon cancer, but missed cycle 3 on 09/17/18/2025 due to worsening of chronic abdominal pain along with nausea and vomiting.  Evaluation in the ED patient was noted to have a potassium of 2.7, normal creatinine, elevated alkaline phosphatase 624, white blood cell 12.  CT abdomen and pelvis showed new left hydroureteronephrosis without clear etiology and suspected increase in the right lower lobe metastatic disease in addition to other stable findings.  Admitted with FFT, dehydration and pain management. Palliative care and oncology following.    Assessment & Plan:   Principal Problem:   Intractable nausea and vomiting Active Problems:   Malignant neoplasm of rectosigmoid (colon) (HCC)   Hypertensive urgency   Hypokalemia   Hyponatremia   1-Intractable abdominal pain, nausea and vomiting: - In the setting of malignancy.  -CT abdomen pelvis:  stable soft tissue mass within the mid rectum, compatible with known history of malignancy. Stable cystic structure within the left lower pelvis, either loculated fluid, cystic peritoneal metastases, or left adnexal cyst. Interval development of mild left hydronephrosis and left hydroureter, without clear cause for obstruction identified. Stable peritoneal metastases and omental caking, most pronounced in the right upper quadrant.Diffuse metastatic disease throughout the right lobe liver, subjectively worse since prior exam. Small volume ascites. -Treated  IV fluids, supportive care -Started on Dilaudid  PCA -Palliative  care consulted. -Patient is not able to afford   hydromorphone .  Sent message to palliative care team outpatient.  Palliative care started insurance authorization for hydromorphone  as an outpatient. -Patient was asking for PCA pump for home.  She is supposed to follow-up with IR for pump insertion.  Palliative care has consulted IR.  The plan is to proceed with inpatient test dose.  Pump to be placed October.  -appreciate palliative assistance, fentanyl  patch dose increased, plan  to discontinue PCA pump tomorrow.   Metastatic colon cancer: On FOLFOX under the care of Dr. Cloretta  Dr Cloretta following.   Hypokalemia: - Replaced.   Hypekalemia likely lab error, B mL was obtained from PICC line while IV potassium infusion was infusing  Hypertensive  urgency: - In the setting of vomiting and unable to take oral medications. As needed labetalol  -Resume Norvasc  -start Metoprolol /.  Stop IV fluids.  She has been on clonidine  in the past. Will start clonidine  daily.   Hyponatremia: - Received IV fluids  1 of 2 Blood culture growing Staph:  -could be contaminate.  -she does have Picc line , site does not appears infected.  -plan to repeat Blood cultures.     Estimated body mass index is 20.56 kg/m as calculated from the following:   Height as of this encounter: 5' 4 (1.626 m).   Weight as of this encounter: 54.3 kg.   DVT prophylaxis: Lovenox  Code Status: Full code Family Communication: Care discussed with patient Disposition Plan:  Status is: Observation The patient will require care spanning > 2 midnights and should be moved to inpatient because: PCA pump, requiring IV fluids    Consultants:  IR Palliative care  Procedures:  none Antimicrobials:    Subjective: She report pain has been controlled with PCA pump. She has been able to eat, no further  vomiting.   Objective: Vitals:   09/21/23 2359 09/22/23 0031 09/22/23 0409 09/22/23 0642  BP: (!) 178/131   (!) 164/134  Pulse: (!) 107   97  Resp: 18 18 14 19    Temp: 99.3 F (37.4 C)   97.9 F (36.6 C)  TempSrc: Oral   Oral  SpO2: 100% 99% 98% 100%  Weight:      Height:        Intake/Output Summary (Last 24 hours) at 09/22/2023 0736 Last data filed at 09/22/2023 9360 Gross per 24 hour  Intake 3292.88 ml  Output --  Net 3292.88 ml   Filed Weights   09/20/23 1650  Weight: 54.3 kg    Examination:  General exam:  NAD Respiratory system: CTA Cardiovascular system: S 1, S 2 RRR Gastrointestinal system: BS present, soft, nt, distended.  Central nervous system: alert Extremities: Symmetric 5 x 5 power.   Data Reviewed: I have personally reviewed following labs and imaging studies  CBC: Recent Labs  Lab 09/16/23 1335 09/20/23 1524 09/21/23 0612 09/22/23 0352  WBC 6.8 12.3* 9.4 11.0*  NEUTROABS 3.5 9.6*  --   --   HGB 10.5* 10.6* 9.2* 10.2*  HCT 32.6* 33.6* 30.1* 33.7*  MCV 86.0 86.2 88.3 89.9  PLT 339 365 293 298   Basic Metabolic Panel: Recent Labs  Lab 09/16/23 1335 09/16/23 1626 09/20/23 1524 09/21/23 0612 09/21/23 1011 09/22/23 0352  NA 134*  --  131* 137 135 134*  K 2.7*  --  2.7* >7.5* 3.3* 4.1  CL 93*  --  87* 105 93* 99  CO2 30  --  27 25 31 25   GLUCOSE 88  --  73 80 119* 85  BUN <5*  --  5* <5* <5* <5*  CREATININE 0.48  --  0.40* 0.38* 0.51 0.37*  CALCIUM  9.3  --  9.2 7.8* 8.9 9.0  MG  --  1.9  --  1.8  --   --    GFR: Estimated Creatinine Clearance: 72.9 mL/min (A) (by C-G formula based on SCr of 0.37 mg/dL (L)). Liver Function Tests: Recent Labs  Lab 09/16/23 1335 09/20/23 1524 09/21/23 0612  AST 59* 50* 51*  ALT 26 23 17   ALKPHOS 710* 624* 528*  BILITOT 0.7 0.9 0.8  PROT 8.8* 8.9* 7.6  ALBUMIN 3.4* 3.4* 2.9*   No results for input(s): LIPASE, AMYLASE in the last 168 hours. No results for input(s): AMMONIA in the last 168 hours. Coagulation Profile: Recent Labs  Lab 09/20/23 1524  INR 0.9   Cardiac Enzymes: No results for input(s): CKTOTAL, CKMB, CKMBINDEX, TROPONINI in  the last 168 hours. BNP (last 3 results) No results for input(s): PROBNP in the last 8760 hours. HbA1C: No results for input(s): HGBA1C in the last 72 hours. CBG: No results for input(s): GLUCAP in the last 168 hours. Lipid Profile: No results for input(s): CHOL, HDL, LDLCALC, TRIG, CHOLHDL, LDLDIRECT in the last 72 hours. Thyroid Function Tests: No results for input(s): TSH, T4TOTAL, FREET4, T3FREE, THYROIDAB in the last 72 hours. Anemia Panel: No results for input(s): VITAMINB12, FOLATE, FERRITIN, TIBC, IRON, RETICCTPCT in the last 72 hours. Sepsis Labs: Recent Labs  Lab 09/20/23 1531  LATICACIDVEN 0.7    Recent Results (from the past 240 hours)  Resp panel by RT-PCR (RSV, Flu A&B, Covid)     Status: None   Collection Time: 09/20/23  3:24 PM   Specimen: Nasal Swab  Result Value Ref Range Status   SARS Coronavirus 2 by  RT PCR NEGATIVE NEGATIVE Final    Comment: (NOTE) SARS-CoV-2 target nucleic acids are NOT DETECTED.  The SARS-CoV-2 RNA is generally detectable in upper respiratory specimens during the acute phase of infection. The lowest concentration of SARS-CoV-2 viral copies this assay can detect is 138 copies/mL. A negative result does not preclude SARS-Cov-2 infection and should not be used as the sole basis for treatment or other patient management decisions. A negative result may occur with  improper specimen collection/handling, submission of specimen other than nasopharyngeal swab, presence of viral mutation(s) within the areas targeted by this assay, and inadequate number of viral copies(<138 copies/mL). A negative result must be combined with clinical observations, patient history, and epidemiological information. The expected result is Negative.  Fact Sheet for Patients:  BloggerCourse.com  Fact Sheet for Healthcare Providers:  SeriousBroker.it  This test is no t yet  approved or cleared by the [place]  FDA and  has been authorized for detection and/or diagnosis of SARS-CoV-2 by FDA under an Emergency Use Authorization (EUA). This EUA will remain  in effect (meaning this test can be used) for the duration of the COVID-19 declaration under Section 564(b)(1) of the Act, 21 U.S.C.section 360bbb-3(b)(1), unless the authorization is terminated  or revoked sooner.       Influenza A by PCR NEGATIVE NEGATIVE Final   Influenza B by PCR NEGATIVE NEGATIVE Final    Comment: (NOTE) The Xpert Xpress SARS-CoV-2/FLU/RSV plus assay is intended as an aid in the diagnosis of influenza from Nasopharyngeal swab specimens and should not be used as a sole basis for treatment. Nasal washings and aspirates are unacceptable for Xpert Xpress SARS-CoV-2/FLU/RSV testing.  Fact Sheet for Patients: BloggerCourse.com  Fact Sheet for Healthcare Providers: SeriousBroker.it  This test is not yet approved or cleared by the [place]  FDA and has been authorized for detection and/or diagnosis of SARS-CoV-2 by FDA under an Emergency Use Authorization (EUA). This EUA will remain in effect (meaning this test can be used) for the duration of the COVID-19 declaration under Section 564(b)(1) of the Act, 21 U.S.C. section 360bbb-3(b)(1), unless the authorization is terminated or revoked.     Resp Syncytial Virus by PCR NEGATIVE NEGATIVE Final    Comment: (NOTE) Fact Sheet for Patients: BloggerCourse.com  Fact Sheet for Healthcare Providers: SeriousBroker.it  This test is not yet approved or cleared by the [place]  FDA and has been authorized for detection and/or diagnosis of SARS-CoV-2 by FDA under an Emergency Use Authorization (EUA). This EUA will remain in effect (meaning this test can be used) for the duration of the COVID-19 declaration under Section 564(b)(1) of  the Act, 21 U.S.C. section 360bbb-3(b)(1), unless the authorization is terminated or revoked.  Performed at [facility], [address], [place], [place] [address]   Blood Culture (routine x 2)     Status: None (Preliminary result)   Collection Time: 09/20/23  3:30 PM   Specimen: BLOOD LEFT HAND  Result Value Ref Range Status   Specimen Description   Final    BLOOD LEFT HAND Performed at [facility], [address] [place], [place] [address]    Special Requests   Final    BOTTLES DRAWN AEROBIC AND ANAEROBIC Blood Culture adequate volume Performed at [facility], [address], [place], [place] [address]    Culture   Final    NO GROWTH < 24 HOURS Performed at [facility], [address], [place], [place] [address]  Report Status PENDING  Incomplete  Blood Culture (routine x 2)     Status: None (Preliminary result)   Collection Time: 09/20/23  9:53 PM   Specimen: BLOOD RIGHT ARM  Result Value Ref Range Status   Specimen Description   Final    BLOOD RIGHT ARM Performed at [facility], [address], [place], [place] [address]    Special Requests   Final    BOTTLES DRAWN AEROBIC AND ANAEROBIC Blood Culture results may not be optimal due to an inadequate volume of blood received in culture bottles Performed at [facility], [address] [place], [place] [address]    Culture  Setup Time   Final    GRAM POSITIVE COCCI IN CLUSTERS ANAEROBIC BOTTLE ONLY CRITICAL RESULT CALLED TO, READ BACK BY AND VERIFIED WITH: PHARMD MELISSA JAMES ON 09/21/23 @ 1514 BY DRT Performed at [facility], [address], [place], [place] [address]    Culture GRAM POSITIVE COCCI IN CLUSTERS  Final   Report Status PENDING  Incomplete  Blood Culture ID Panel (Reflexed)     Status: Abnormal   Collection Time: 09/20/23  9:53 PM  Result Value Ref Range Status   Enterococcus faecalis NOT DETECTED NOT  DETECTED Final   Enterococcus Faecium NOT DETECTED NOT DETECTED Final   Listeria monocytogenes NOT DETECTED NOT DETECTED Final   Staphylococcus species DETECTED (A) NOT DETECTED Final    Comment: CRITICAL RESULT CALLED TO, READ BACK BY AND VERIFIED WITH: PHARMD MELISSA JAMES ON 09/21/23 @ 1514 BY DRT    Staphylococcus aureus (BCID) NOT DETECTED NOT DETECTED Final   Staphylococcus epidermidis NOT DETECTED NOT DETECTED Final   Staphylococcus lugdunensis NOT DETECTED NOT DETECTED Final   Streptococcus species NOT DETECTED NOT DETECTED Final   Streptococcus agalactiae NOT DETECTED NOT DETECTED Final   Streptococcus pneumoniae NOT DETECTED NOT DETECTED Final   Streptococcus pyogenes NOT DETECTED NOT DETECTED Final   A.calcoaceticus-baumannii NOT DETECTED NOT DETECTED Final   Bacteroides fragilis NOT DETECTED NOT DETECTED Final   Enterobacterales NOT DETECTED NOT DETECTED Final   Enterobacter cloacae complex NOT DETECTED NOT DETECTED Final   Escherichia coli NOT DETECTED NOT DETECTED Final   Klebsiella aerogenes NOT DETECTED NOT DETECTED Final   Klebsiella oxytoca NOT DETECTED NOT DETECTED Final   Klebsiella pneumoniae NOT DETECTED NOT DETECTED Final   Proteus species NOT DETECTED NOT DETECTED Final   Salmonella species NOT DETECTED NOT DETECTED Final   Serratia marcescens NOT DETECTED NOT DETECTED Final   Haemophilus influenzae NOT DETECTED NOT DETECTED Final   Neisseria meningitidis NOT DETECTED NOT DETECTED Final   Pseudomonas aeruginosa NOT DETECTED NOT DETECTED Final   Stenotrophomonas maltophilia NOT DETECTED NOT DETECTED Final   Candida albicans NOT DETECTED NOT DETECTED Final   Candida auris NOT DETECTED NOT DETECTED Final   Candida glabrata NOT DETECTED NOT DETECTED Final   Candida krusei NOT DETECTED NOT DETECTED Final   Candida parapsilosis NOT DETECTED NOT DETECTED Final   Candida tropicalis NOT DETECTED NOT DETECTED Final   Cryptococcus neoformans/gattii NOT DETECTED NOT  DETECTED Final    Comment: Performed at [facility], [address] [place], [place] [address]         Radiology Studies: CT ABDOMEN PELVIS W CONTRAST Result Date: 09/20/2023 CLINICAL DATA:  Abdominal pain, history of metastatic colon cancer, nausea and vomiting EXAM: CT ABDOMEN AND PELVIS WITH CONTRAST TECHNIQUE: Multidetector CT imaging of the abdomen and pelvis was performed using the  standard protocol following bolus administration of intravenous contrast. RADIATION DOSE REDUCTION: This exam was performed according to the departmental dose-optimization program which includes automated exposure control, adjustment of the mA and/or kV according to patient size and/or use of iterative reconstruction technique. CONTRAST:  OMNIPAQUE  IOHEXOL  300 MG/ML  SOLN COMPARISON:  08/02/2023 FINDINGS: Lower chest: No acute pleural or parenchymal lung disease. Hepatobiliary: Marked heterogeneity throughout the right lobe liver compatible with known diffuse hepatic metastases. The index lesion within the right lobe liver measured previously is more difficult to measure discretely on this exam, with subjective increase in the number of hypodense lesions consistent with progressive metastases. Persistent indwelling biliary duct stents, with no change in the intrahepatic biliary duct dilation seen on prior study. Pancreas: Unremarkable. No pancreatic ductal dilatation or surrounding inflammatory changes. Spleen: Normal in size without focal abnormality. Adrenals/Urinary Tract: Stable bilateral nonobstructing renal calculi measuring up to 13 mm on the left and 9 mm on the right. Interval development of mild left hydronephrosis and hydroureter, without clear cause for obstruction identified. The adrenals are unremarkable. Bladder is moderately distended without filling defect. Stomach/Bowel: No bowel obstruction or ileus. The ill-defined soft tissue mass along the mid rectum is again identified, difficult to  clearly visualize due to loss of fat plane between the mass and the posterior wall the uterus, measuring up to 5.4 x 3.0 cm reference image 60/2. No evidence of acute bowel inflammation. Vascular/Lymphatic: No significant vascular findings. No discrete adenopathy. Reproductive: Uterus is atrophic. There is a 6.8 x 5.7 cm fluid collection within the region of the left adnexa, unchanged since prior exam. It is unclear whether this reflects loculated fluid, peritoneal metastasis, or a left adnexal cyst. Other: Small volume abdominal ascites. Omental caking is again noted, most pronounced in the right upper quadrant, not appreciably changed. No free intraperitoneal gas. No abdominal wall hernia. Musculoskeletal: No acute or destructive bony abnormalities. Reconstructed images demonstrate no additional findings. IMPRESSION: 1. Grossly stable soft tissue mass within the mid rectum, difficult to measure accurately given loss of normal fat plane between the mass and the posterior wall the uterus, compatible with known history of malignancy. 2. Stable cystic structure within the left lower pelvis, either loculated fluid, cystic peritoneal metastases, or left adnexal cyst. 3. Interval development of mild left hydronephrosis and left hydroureter, without clear cause for obstruction identified. The obstruction could be related to extrinsic mass effect upon the distal left ureter by the cystic structure described above. 4. Stable peritoneal metastases and omental caking, most pronounced in the right upper quadrant. 5. Diffuse metastatic disease throughout the right lobe liver, subjectively worse since prior exam. 6. Small volume ascites. 7. Bilateral nonobstructing renal calculi. Electronically Signed   By: Ozell Daring M.D.   On: 09/20/2023 17:30   DG Chest Port 1 View Result Date: 09/20/2023 CLINICAL DATA:  Generalized abdominal pain, nausea and vomiting, history of metastatic colon cancer EXAM: PORTABLE CHEST 1 VIEW  COMPARISON:  06/20/2023 FINDINGS: Single frontal view of the chest demonstrates an unremarkable cardiac silhouette. No airspace disease, effusion, or pneumothorax. Left-sided PICC tip overlies superior vena cava. No acute bony abnormalities. IMPRESSION: 1. No acute intrathoracic process. Electronically Signed   By: Ozell Daring M.D.   On: 09/20/2023 16:24        Scheduled Meds:  amLODipine   10 mg Oral Daily   Chlorhexidine  Gluconate Cloth  6 each Topical Daily   enoxaparin  (LOVENOX ) injection  40 mg Subcutaneous Q24H   fentaNYL   1 patch Transdermal  Q72H   HYDROmorphone    Intravenous Q4H   senna-docusate  1 tablet Oral BID   sodium chloride  flush  3 mL Intravenous Q12H   Continuous Infusions:  0.9 % NaCl with KCl 40 mEq / L 100 mL/hr at 09/22/23 9360   promethazine  (PHENERGAN ) injection (IM or IVPB)       LOS: 1 day    Time spent: 35 minutes    Zaide Kardell A Yoshika Vensel, MD [facility]   If 7PM-7AM, please contact night-coverage [email or web address]  09/22/2023, 7:36 AM

## 2023-09-23 ENCOUNTER — Ambulatory Visit

## 2023-09-23 ENCOUNTER — Inpatient Hospital Stay (HOSPITAL_COMMUNITY)

## 2023-09-23 ENCOUNTER — Encounter: Payer: Self-pay | Admitting: *Deleted

## 2023-09-23 DIAGNOSIS — Z79899 Other long term (current) drug therapy: Secondary | ICD-10-CM | POA: Diagnosis not present

## 2023-09-23 DIAGNOSIS — Z515 Encounter for palliative care: Secondary | ICD-10-CM | POA: Diagnosis not present

## 2023-09-23 DIAGNOSIS — G893 Neoplasm related pain (acute) (chronic): Secondary | ICD-10-CM | POA: Diagnosis not present

## 2023-09-23 DIAGNOSIS — C19 Malignant neoplasm of rectosigmoid junction: Secondary | ICD-10-CM | POA: Diagnosis not present

## 2023-09-23 DIAGNOSIS — R112 Nausea with vomiting, unspecified: Secondary | ICD-10-CM | POA: Diagnosis not present

## 2023-09-23 LAB — CBC
HCT: 33 % — ABNORMAL LOW (ref 36.0–46.0)
Hemoglobin: 10 g/dL — ABNORMAL LOW (ref 12.0–15.0)
MCH: 26.8 pg (ref 26.0–34.0)
MCHC: 30.3 g/dL (ref 30.0–36.0)
MCV: 88.5 fL (ref 80.0–100.0)
Platelets: 314 K/uL (ref 150–400)
RBC: 3.73 MIL/uL — ABNORMAL LOW (ref 3.87–5.11)
RDW: 17.7 % — ABNORMAL HIGH (ref 11.5–15.5)
WBC: 10.2 K/uL (ref 4.0–10.5)
nRBC: 0 % (ref 0.0–0.2)

## 2023-09-23 LAB — BASIC METABOLIC PANEL WITH GFR
Anion gap: 10 (ref 5–15)
BUN: 6 mg/dL (ref 6–20)
CO2: 26 mmol/L (ref 22–32)
Calcium: 9.6 mg/dL (ref 8.9–10.3)
Chloride: 94 mmol/L — ABNORMAL LOW (ref 98–111)
Creatinine, Ser: 0.38 mg/dL — ABNORMAL LOW (ref 0.44–1.00)
GFR, Estimated: >60 mL/min (ref >60)
Glucose, Bld: 94 mg/dL (ref 70–99)
Potassium: 4.2 mmol/L (ref 3.5–5.1)
Sodium: 130 mmol/L — ABNORMAL LOW (ref 135–145)

## 2023-09-23 LAB — CEA: CEA: 190 ng/mL — ABNORMAL HIGH (ref 0.0–4.7)

## 2023-09-23 MED ORDER — MORPHINE SULFATE (PF) 0.5 MG/ML IJ SOLN
0.5000 mg | Freq: Once | INTRAMUSCULAR | Status: AC
  Administered 2023-09-23: 0.5 mg via EPIDURAL
  Filled 2023-09-23: qty 10

## 2023-09-23 NOTE — Progress Notes (Addendum)
 April [name]   DOB:1975-12-22   FM#:[id number]    ONCOLOGY PROGRESS NOTE:  ASSESSMENT & PLAN:  April [name] is a 48 year old female patient with oncology history significant for colon cancer.  She was admitted on 09/20/23 with complaints of severe abdominal pain, nausea, and vomiting.  Medical Oncology following closely.  Cancer related abdominal pain - Patient admitted on 09/20/2023 with worsening abdominal pain. - On IV Dilaudid  via PCA pump, continue per orders.  Palliative management of pain medication regiment appreciated. - Consideration for intrathecal pump noted.  Sigmoid colon cancer, stage IV, (pT4a,pN2b,M1c)  - Diagnosed in 2021 -Status post robotic assisted low anterior resection, mesenteric lymphadenectomy, bilateral salpingo-- oophorectomy in 2021. - Status post FOLFIRI plus bevacizumab  initiated 05/24/2019.  Bevacizumab  held from cycle 5.  Completed cycle 11 12/26/2019. - Restarted on FOLFIRI plus panitumumab  09/19/2020, completed cycle 15 06/17/2021.  Started on cycle 1 Lonsurf  and Avastin  10/14/2021.  Cycle 6 completed 03/03/2022.  Subsequently given cycle 1 FOLFOX for 02/01/2022, cycle 11 given 09/22/2022.  Patient declined further treatment. - Restarted patient on irinotecan  plus panitumumab  01/06/2023 with cycle 8 given 06/19/2023.  Irinotecan  discontinued 07/10/2023 due to lack of clinical benefit and poor tolerance. - More recently on chemotherapy regimen FOLFOX , cycle 2 started 08/26/2023.  Patient states reports that she was due to return for cycle 3 chemotherapy last week 9/4 however felt very nauseous and could not get herself out of bed. - CEA pending - Medical oncology/Dr. Cloretta following closely  Nausea/vomiting - Patient reports intermittent nausea and vomiting.  Currently feels okay.  Discussed with patient to consider taking antiemetics evening before and morning of outpatient chemotherapy to prevent feeling nauseous morning of chemotherapy. -  Continue supportive care  Anemia, normocytic - Hemoglobin 10.2 today - No transfusional intervention required at this time - Continue to monitor CBC with differential    Code Status Full   Subjective:  Patient seen awake alert and oriented x 3 sitting up in bed.  Reports that abdominal pain that is usually intense behind her navel is much better since she has been on the IV Dilaudid  PCA pump.  Also reports no rectal pressure at this time.  Denies other acute symptoms.  No acute distress is noted.  Objective:   Intake/Output Summary (Last 24 hours) at 09/22/2023 0917 Last data filed at 09/22/2023 9360 Gross per 24 hour Intake 3052.88 ml Output -- Net 3052.88 ml    PHYSICAL EXAMINATION: ECOG PERFORMANCE STATUS: 3 - Symptomatic, >50% confined to bed  Vitals:  09/22/23 0642 09/22/23 0845 BP: (!) 164/134  Pulse: 97  Resp: 19 13 Temp: 97.9 F (36.6 C)  SpO2: 100% 100%  Filed Weights  09/20/23 1650 Weight: 119 lb 12.8 oz (54.3 kg)   GENERAL: alert, no distress and comfortable +frail appearing SKIN: skin color, texture, turgor are normal, no rashes or significant lesions EYES: normal, conjunctiva are pink and non-injected, sclera clear OROPHARYNX: no exudate, no erythema and lips, buccal mucosa, and tongue normal  NECK: supple, thyroid normal size, non-tender, without nodularity LYMPH: no palpable lymphadenopathy in the cervical, axillary or inguinal LUNGS: clear to auscultation and percussion with normal breathing effort HEART: regular rate & rhythm and no murmurs and no lower extremity edema ABDOMEN: +Abdominal distention, no discrete mass MUSCULOSKELETAL: no cyanosis of digits and no clubbing  PSYCH: alert & oriented x 3 with fluent speech NEURO: no focal motor/sensory deficits   All questions were answered. The patient knows to call the clinic with any  problems, questions or concerns.   The total time spent in the appointment was 40 minutes encounter with patient  including review of chart and various tests results, discussions about plan of care and coordination of care plan  Olam JINNY Brunner, NP 09/22/2023 9:17 AM    Labs Reviewed:  Lab Results Component Value Date  WBC 11.0 (H) 09/22/2023  HGB 10.2 (L) 09/22/2023  HCT 33.7 (L) 09/22/2023  MCV 89.9 09/22/2023  PLT 298 09/22/2023  Recent Labs   12/16/22 0505 12/17/22 0500 06/21/23 0539 06/22/23 0415 09/16/23 1335 09/20/23 1524 09/21/23 0612 09/21/23 1011 09/22/23 0352 NA 132*   < > 133*   < > 134* 131* 137 135 134* K 2.9*   < > 3.4*   < > 2.7* 2.7* >7.5* 3.3* 4.1 CL 97*   < > 95*   < > 93* 87* 105 93* 99 CO2 29   < > 29   < > 30 27 25 31 25  GLUCOSE 97   < > 83   < > 88 73 80 119* 85 BUN 8   < > 7   < > <5* 5* <5* <5* <5* CREATININE 0.52   < > 0.37*   < > 0.48 0.40* 0.38* 0.51 0.37* CALCIUM  8.5*   < > 8.5*   < > 9.3 9.2 7.8* 8.9 9.0 GFRNONAA >60   < > >60   < > >60 >60 >60 >60 >60 PROT 7.1   < > 6.9   < > 8.8* 8.9* 7.6  --   --  ALBUMIN 3.2*   < > 2.6*   < > 3.4* 3.4* 2.9*  --   --  AST 37   < > 25   < > 59* 50* 51*  --   --  ALT 28   < > 16   < > 26 23 17   --   --  ALKPHOS 204*   < > 606*   < > 710* 624* 528*  --   --  BILITOT 0.5   < > 0.7   < > 0.7 0.9 0.8  --   --  BILIDIR 0.1  --  <0.1  --   --   --  0.4*  --   --  IBILI 0.4  --  NOT CALCULATED  --   --   --  0.4  --   --   < > = values in this interval not displayed.   Studies Reviewed:  CT ABDOMEN PELVIS W CONTRAST Result Date: 09/20/2023 CLINICAL DATA:  Abdominal pain, history of metastatic colon cancer, nausea and vomiting EXAM: CT ABDOMEN AND PELVIS WITH CONTRAST TECHNIQUE: Multidetector CT imaging of the abdomen and pelvis was performed using the standard protocol following bolus administration of intravenous contrast. RADIATION DOSE REDUCTION: This exam was performed according to the departmental dose-optimization program which includes automated exposure control, adjustment of the mA and/or kV according to patient size  and/or use of iterative reconstruction technique. CONTRAST:  OMNIPAQUE  IOHEXOL  300 MG/ML  SOLN COMPARISON:  08/02/2023 FINDINGS: Lower chest: No acute pleural or parenchymal lung disease. Hepatobiliary: Marked heterogeneity throughout the right lobe liver compatible with known diffuse hepatic metastases. The index lesion within the right lobe liver measured previously is more difficult to measure discretely on this exam, with subjective increase in the number of hypodense lesions consistent with progressive metastases. Persistent indwelling biliary duct stents, with no change in the intrahepatic biliary duct dilation seen on prior study. Pancreas: Unremarkable. No  pancreatic ductal dilatation or surrounding inflammatory changes. Spleen: Normal in size without focal abnormality. Adrenals/Urinary Tract: Stable bilateral nonobstructing renal calculi measuring up to 13 mm on the left and 9 mm on the right. Interval development of mild left hydronephrosis and hydroureter, without clear cause for obstruction identified. The adrenals are unremarkable. Bladder is moderately distended without filling defect. Stomach/Bowel: No bowel obstruction or ileus. The ill-defined soft tissue mass along the mid rectum is again identified, difficult to clearly visualize due to loss of fat plane between the mass and the posterior wall the uterus, measuring up to 5.4 x 3.0 cm reference image 60/2. No evidence of acute bowel inflammation. Vascular/Lymphatic: No significant vascular findings. No discrete adenopathy. Reproductive: Uterus is atrophic. There is a 6.8 x 5.7 cm fluid collection within the region of the left adnexa, unchanged since prior exam. It is unclear whether this reflects loculated fluid, peritoneal metastasis, or a left adnexal cyst. Other: Small volume abdominal ascites. Omental caking is again noted, most pronounced in the right upper quadrant, not appreciably changed. No free intraperitoneal gas. No abdominal wall  hernia. Musculoskeletal: No acute or destructive bony abnormalities. Reconstructed images demonstrate no additional findings. IMPRESSION: 1. Grossly stable soft tissue mass within the mid rectum, difficult to measure accurately given loss of normal fat plane between the mass and the posterior wall the uterus, compatible with known history of malignancy. 2. Stable cystic structure within the left lower pelvis, either loculated fluid, cystic peritoneal metastases, or left adnexal cyst. 3. Interval development of mild left hydronephrosis and left hydroureter, without clear cause for obstruction identified. The obstruction could be related to extrinsic mass effect upon the distal left ureter by the cystic structure described above. 4. Stable peritoneal metastases and omental caking, most pronounced in the right upper quadrant. 5. Diffuse metastatic disease throughout the right lobe liver, subjectively worse since prior exam. 6. Small volume ascites. 7. Bilateral nonobstructing renal calculi. Electronically Signed   By: Ozell Daring M.D.   On: 09/20/2023 17:30   DG Chest Port 1 View Result Date: 09/20/2023 CLINICAL DATA:  Generalized abdominal pain, nausea and vomiting, history of metastatic colon cancer EXAM: PORTABLE CHEST 1 VIEW COMPARISON:  06/20/2023 FINDINGS: Single frontal view of the chest demonstrates an unremarkable cardiac silhouette. No airspace disease, effusion, or pneumothorax. Left-sided PICC tip overlies superior vena cava. No acute bony abnormalities. IMPRESSION: 1. No acute intrathoracic process. Electronically Signed   By: Ozell Daring M.D.   On: 09/20/2023 16:24   1. Sigmoid colon cancer, stage IV (pT4a,pN2b,M1c)  o Colonoscopy 03/24/2019-3 rectal polyps-hyperplastic polyps, distal colon biopsy-at least intramucosal adenocarcinoma, completely obstructing mass in the distal sigmoid colon, could not be traversed, intact mismatch repair protein expression  o 03/24/2019-CEA 56.1  o 03/29/2019 CT  abdomen/pelvis-circumferential thickening involving the entire mid and distal sigmoid colon to the level of the rectosigmoid junction, solid/cystic mass in the left ovary, bilateral nephrolithiasis  o Robotic assisted low anterior resection, mesenteric lymphadenectomy, bilateral salpingo-oophorectomy 04/08/2019  o Pathology ([facility] review of outside pathology) metastatic adenocarcinoma involving the peritoneum overlying the round ligament, serosa of the urinary bladder, serosa of the right ureter a sacral area, pelvic peritoneum, and left ovary.  Omental biopsy with focal mucin pools with no carcinoma cells identified, right hemidiaphragm biopsy involved by metastatic adenocarcinoma, invasive adenocarcinoma the sigmoid colon, moderately differentiated, T4a, perineural and vascular invasion present, 7/22 lymph nodes, multiple tumor deposits, resection margins negative  o Negative for PD-L1, low probability of MSI-high, HER-2 negative, negative for BRAF,  NRAS and KRAS alterations  o CTs 05/19/2019-no evidence of metastatic disease, findings suspicious for colitis of the transverse and ascending colon, small amount of ascites in the cul-de-sac, bilateral renal calculi  o Cycle 1 FOLFIRI 05/24/2019, bevacizumab  added with cycle 2  o Cycle 4 FOLFIRI/bevacizumab  07/05/2019  o Cycle 5 FOLFIRI 07/27/2019, bevacizumab  held secondary to hypertension  o Cycle 6 FOLFIRI 08/10/1999, bevacizumab  held secondary to hypertension  o CTs 08/30/2019-no evidence of recurrent disease, new subsolid right lower lobe nodule felt to be inflammatory, emphysema  o Cycle 7 FOLFIRI 09/26/2019, bevacizumab  remains on hold secondary to hypertension  o Cycle 8 FOLFIRI 10/17/2019, bevacizumab  held secondary to hypertension, Udenyca  added for neutropenia  o Cycle 9 FOLFIRI 11/07/2019, bevacizumab  held secondary to hypertension, Udenyca   o Cycle 10 FOLFIRI 11/28/2019, bevacizumab  held, Udenyca   o Cycle 11 FOLFIRI 12/26/2019, bevacizumab  held,  Udenyca   o CTs 01/25/2020-no evidence of recurrent disease, resolution of right lower lobe nodule  o Maintenance Xeloda  beginning 02/06/2020  o CTs 04/25/2020- no evidence of metastatic disease, multiple bilateral renal calculi without hydronephrosis  o Maintenance Xeloda  continued  o CT abdomen/pelvis without contrast 08/10/2020-bilateral staghorn renal calculi, no evidence of metastatic disease; addendum 08/23/2020-small but increasing omental nodules.  o Cycle 1 FOLFIRI/panitumumab  09/19/2020  o Cycle 2 FOLFIRI/Panitumumab  10/03/2020  o Cycle 3 FOLFIRI/Panitumumab  10/17/2020  o Cycle 4 FOLFIRI/panitumumab  10/31/2020  o Cycle 5 FOLFIRI/Panitumumab  11/14/2020  o CTs 11/26/2020-stable omental metastases compared to 10/22/2020, mildly decreased from 08/10/2020.  Left lower quadrant tiny paracolic gutter implant slightly decreased from CT 08/10/2020.  No new or progressive metastatic disease in the abdomen or pelvis.  o Cycle 6 FOLFIRI/Panitumumab  11/28/2020, irinotecan  dose reduced, treatment schedule adjusted to every 3 weeks going forward  o Cycle 7 FOLFIRI/panitumumab  12/24/2020  o Cycle 8 FOLFIRI/Panitumumab  01/16/2021--treatment held due to hypertension in the infusion area.  o Cycle 8 FOLFIRI/panitumumab  01/21/2021  o Cycle 9 FOLFIRI/panitumumab  02/11/2021  o Cycle 10 FOLFIRI/Panitumumab  03/04/2021  o Cycle 11 FOLFIRI/Panitumumab  03/25/2021  o CT abdomen/pelvis 04/10/2021-stable right omental and left posterior paracolic gutter nodules  o Cycle 12 FOLFIRI/panitumumab  04/15/2021  o Cycle 13 FOLFIRI/panitumumab  05/06/2021  o Cycle 14 FOLFIRI/Panitumumab  05/27/2021  o Cycle 15 FOLFIRI/Panitumumab  06/17/2021  o CT abdomen/pelvis 07/05/2021-slight enlargement of a dominant right omental implant, no new implants  o Patient requested a treatment break  o CT abdomen/pelvis 10/04/2021-mild increase in size of multiple omental soft tissue nodules, 4 mm calculus in the distal left ureter adjacent to the ureteral  stent  o Cycle 1 Lonsurf  10/14/2021  o 10/28/2021 Avastin  every 2 weeks  o Cycle 2 Lonsurf  11/11/2021  o Cycle 3 Lonsurf  12/09/2021  o Cycle 4 Lonsurf  01/06/2022  o Avastin  held 01/20/2022 due to proteinuria, 24-hour urine 43 mg  o CT/pelvis 01/30/2022-possible new peritoneal implant at the transverse colon, no significant change in other omental/peritoneal implants, stable chronic rectal wall thickening, status post removal of double-J left ureteral stent with no evidence of hydronephrosis or ureteral calculus, nonobstructing bilateral renal calculi  o Cycle 5 Lonsurf  02/05/2022 or 02/06/2022  o Avastin  every 2 weeks  o Cycle 6 Lonsurf  03/03/2022  o CTs 04/05/2022-increase in size and number of peritoneal implants compared to January 2024 central liver mass narrowing the anterior branch of the right portal vein, decreased biliary duct dilation, biliary stents in place, stable right cardiophrenic lymph nodes, separate pigtail stent in the lumen of the proximal duodenum  o Cycle 1 FOLFOX 04/14/2022  o CT abdomen/pelvis 04/16/2022-stent within the third portion of the  duodenum, stable peritoneal nodules, indwelling biliary stents with mild left biliary duct dilation  o Cycle 2 FOLFOX 04/28/2022, Emend and prophylactic dexamethasone  added  o Cycle 3 FOLFOX 05/12/2022, Aloxi , Emend, prophylactic dexamethasone , Compazine  and lorazepam  as needed  o Chemotherapy held 05/25/2020 due to severe hypertension, evaluated in the emergency department  o Cycle 4 FOLFOX 06/03/2022  o Cycle 5 FOLFOX 06/16/2022, oxaliplatin  dose reduced and 5-FU bolus eliminated due to neutropenia  o CT 06/25/2022-unchanged soft tissue at the hepatic hilum, unchanged, bile duct stent, decreased size of peritoneal and omental nodules and diminished volume of likely fluid in the lower left pelvis  o Cycle 6 FOLFOX 06/30/2022  o Cycle 7 FOLFOX 07/21/2022  o CT 07/24/2022 (in the emergency department with nausea, headache, abdominal pain)-no acute  pathology.  No significant interval change in omental nodularity.  o Cycle 8 FOLFOX 08/04/2022, pruritus on the palms at the end of the oxaliplatin  infusion, Pepcid  and Benadryl  given, symptoms resolved, infusion not resumed  o Cycle 9 FOLFOX 08/18/2022-Pepcid  and Benadryl  added to premedications, Oxaliplatin  diluted in a larger volume and infusion time increased  o Cycle 10 FOLFOX 09/08/2022  o Cycle 11 FOLFOX 09/22/2022  o 10/06/2022 patient declined treatment  o CTs 10/15/2022-similar soft tissue fullness in the hepatic hilum with biliary stents in place, decrease in omental/peritoneal metastases  o Maintenance capecitabine  10/26/2022  o CT abdomen/pelvis 11/19/2022-mild increase in tumor studding at the left paracolic gutter and omentum, circumferential wall thickening of the rectum, biliary stents in place with minimal increase in intrahepatic biliary dilation, nonobstructive renal calculi  o Colonoscopy 11/23/2022-anastomosis at 15 cm from the anal verge-patent, 2 cm polypoid tissue at the anastomosis biopsied, invasive adenocarcinoma moderately differentiated, preserved expression of major MMR proteins  o CTs 12/15/2022: Possible developing nodule in the lateral left liver, increased omental nodularity, persistent wall thickening in the upper rectum  o CT abdomen/pelvis 12/24/2022: Increased thickened fold in the transverse colon-infectious/inflammatory, 2.1 cm lateral left liver lesion, potential new 5 mm segment 5 lesion, stable biliary stents with intrahepatic biliary dilation, central biliary wall thickening near the hepatic hilum, multiple peritoneal/omental implants unchanged from 12/15/2022, but likely larger than 11/19/2022  o Cycle 1 irinotecan /Panitumumab  01/06/2023  o Cycle 2 irinotecan /Panitumumab  01/20/2023  o CTs 01/24/2023-slight increase in omental nodularity.  o Cycle 3 irinotecan /Panitumumab  02/03/2023  o Cycle 4 irinotecan /panitumumab  02/17/2023  o CTs abdomen/pelvis 02/24/2023-subtly  hypoattenuating lesion within the peripheral aspect of hepatic segment 2 redemonstrated.  Additional scattered subcentimeter low-density lesions within the liver too small to characterize.  Prominent intrahepatic biliary dilatation with biliary stents in place.  Similar degree of nodularity along the omentum and left paracolic gutter.  No ascites.  o CTs 03/09/2023-indwelling endoscopically placed stents; level of intrahepatic biliary duct dilatation is slightly improved on the left and some on the right.  Gallbladder is dilated with wall edema which is increasing.  More nodular tissue extending up along the porta hepatis.  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  Rectum with nodular wall thickening with extension into the adjacent tissue, nodular areas seen extending perirectal and presacral spaces.  Peritoneal carcinomatosis again identified.  o Irinotecan /Panitumumab  04/02/2023  o Irinotecan /panitumumab  04/16/2023  o Irinotecan /Panitumumab  05/07/2023  o Irinotecan /Panitumumab  06/19/2023  o CT abdomen/pelvis 06/21/2023-stable heterogeneous enhancement throughout the liver with nodularity along the surface of the liver.  Small amount of perihepatic ascites.  Stable nodularity right abdomen.  Circumferential rectal wall thickening/mass unchanged.  o Irinotecan /Panitumumab  discontinued 07/10/2023 due to lack of clinical benefit/poor tolerance  o  Appointment with Dr. Sheilda 07/23/2023  o CTs 08/02/2023-mid rectal soft tissue mass, mildly progressive.  Right hepatic metastases, progressive.  Peritoneal disease/omental caking stable versus mildly progressive.  Small volume abdominopelvic ascites mildly progressive.  o Cycle 1 FOLFOX 08/05/2023  o Cycle 2 FOLFOX 08/20/2023 (Pepcid  and Benadryl  daily x 2 days before treatment, Pepcid  continued for 2 days post oxaliplatin ; oxaliplatin  administration time increased)    Hypertension 2. G4 P3, twins 3. Kidney stones-bilateral staghorn renal calculi on CT  08/10/2020 4. Infected Port-A-Cath 09/12/2019-placed on Augmentin , referred for Port-A-Cath removal; Port-A-Cath removed 09/14/2019; PICC line placed 09/14/2019; culture staph aureus.  Course of Septra  completed. 5. Neutropenia secondary to chemotherapy-Udenyca  added with cycle 8 FOLFIRI 6. Right nephrostomy tube 09/12/2020; stent placed 10/09/2020, percutaneous nephrostolithotomy treatment of right-sided kidney stones 10/09/2020, malpositioned right ureter stent replacement 10/23/2020, right ureter stent removed 10/29/2020  Percutaneous left nephrostolithotomy 10/01/2021-no renal stone identified, left ureter stent left in place  Cystoscopy 10/14/2021-stent removed 8.  Admission 03/21/2022 with new onset jaundice, abdominal pain, nausea  MRI abdomen 03/22/2022-central liver mass with obstruction at the confluence of the intrahepatic ducts and proximal common hepatic duct with severe Intermatic biliary ductal dilatation, progressive peritoneal carcinomatosis  ERCP 03/24/2022-severe biliary stricture in the hepatic duct affecting the left and right hepatic duct, common hepatic duct, and bifurcation, malignant appearing, temporary plastic pancreatic stent, left and right hepatic duct stents were placed  ERCP 02/25/2018 25-2 visibly patent stents from the biliary tree were seen in the major papilla, removed.  2 moderate biliary strictures found in the hepatic duct system, malignant appearing.  Left and right hepatic duct and all intrahepatic branches were moderately dilated secondary to the strictures.  The strictures were dilated.  Plastic stent placed into the left hepatic duct and right hepatic duct. 9.  Admission 04/16/2022 with nausea/vomiting and abdominal pain, felt to be acute toxicity related to chemotherapy, discharged home 04/18/2022 10.  Admission 11/19/2022 with nausea/vomiting and abdominal pain-improved 11.  Admission 12/15/2022 with fever and right submandibular swelling/tenderness  CT neck 12/15/2022: 2 mm distal  right submandibular duct calculus with mild inflammatory change adjacent to the right submandibular gland 12.  Admission 12/24/2022 with a fever nausea/vomiting, diarrhea, Klebsiella bacteremia completed course of Augmentin  13.  Admission with abdominal pain and nausea/vomiting 03/09/2023 through 03/14/2023-concern for gallbladder inflammation.  Not felt to be a surgical candidate.  Percutaneous cholecystostomy tube placed 03/11/2023.  Tube removed 04/24/2023. 14.  Admission 06/20/2023 with abdominal pain and constipation, pain improved partially following a bowel movement 15.  Admission 08/02/2023 with abdominal pain, cycle 1 salvage FOLFOX 08/05/2023 16.  Admission 09/20/2023 with nausea and abdominal pain   Ms. Debruyn is well-known to me with a history of metastatic colon cancer.  She is currently being treated with salvage FOLFOX chemotherapy, last given on 08/26/2023.  She has tolerated the FOLFOX without signs of an allergic reaction.  She was scheduled for a third cycle of FOLFOX 09/17/2023, but developed nausea and increased abdominal pain and did not return for treatment.  She presents to emergency room 09/20/2023 with increased abdominal pain and nausea.  She was admitted for further evaluation. A CT abdomen/pelvis 09/20/2023 revealed no acute change.  There was concern for progressive right liver.  I reviewed the CT images and I see no clear evidence of disease progression.  There is persistent evidence of carcinomatosis and mild left hydronephrosis.  She has a history of kidney stones and underwent a lithotripsy procedure for sided stones in 2022.  She has completed  2 cycles of salvage FOLFOX and it is unclear whether she has progressive disease.  However her pain does not appear improved.  We will check a CEA.  We will plan for cycle 3 FOLFOX to be given as an outpatient.  Pain control has been a challenge in her case.  She has intermittent exacerbations of pain.  She is followed up palliative care  service and is being scheduled for placement of an intrathecal pump.  I discussed the poor overall prognosis with Ms. Renwick.  We discussed hospice care.  Systemic treatment options are very limited if she develops progressive disease on FOLFOX.  She does not wish to consider hospice and would like to remain on a full CODE STATUS.  Recommendations: 1. Pain management per the palliative care service 2. Management of hypertension per the medical service, she has been on clonidine  in the past 3. Check CEA 4. Plan for outpatient FOLFOX cycle 3 5. Continue discussions regarding goals of care and CODE STATUS 6. Bowel regimen 7. [facility]  Arley Hof, MD  09/22/2023 4:02 PM

## 2023-09-23 NOTE — Progress Notes (Signed)
 Daily Progress Note   Patient Name: April [name]       Date: 09/23/2023 DOB: Jul 22, 1975  Age: 48 y.o. MRN#: [id number] Attending Physician: Jadine Toribio SQUIBB, MD Primary Care Physician: Oris Camie BRAVO, NP Admit Date: 09/20/2023 Length of Stay: 2 days  Reason for Consultation/Follow-up: Pain control  Subjective:   CC: Patient notes pain better controlled today.  Following up regarding symptom management.  Subjective:  Reviewed EMR including recent documentation from hospitalist and oncologist.. At time of EMR review in past 24 hours patient has received IV Dilaudid  via PCA bolus dosing 0.6 mg x 76 boluses.  Patient was started on increased dose of fentanyl  patch 100 mcg/h every 72 hours on 09/22/2023.  Patient was also started on dexamethasone  on 09/22/2023.  Presented to bedside to see patient.  Patient feels her pain is better controlled today.  Patient not yet ready to switch over from PCA so discussed would continue PCA while fentanyl  patch reaching steady state today.  Hope to transition over to oral as needed Dilaudid  tomorrow. Patient states that she did have a bowel movement yesterday.  Again discussed continuing lactulose  scheduled and she can always refuse it if she does have a bowel movement.  Patient scheduled for test dose today for possible pain pump placement in October.  Noted this would hopefully test if patient will respond well to pain pump.  Discussed with outpatient PMT provider to coordinate.  All questions answered at that time.  Noted palliative medicine team will continue to follow patient's medical drain.  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  Imaging obtained demonstrated new mild left hydroureteronephrosis without clear etiology and suspected increase in metastatic disease burden in liver.  Palliative medicine team consulted to assist with pain management. Of note, patient well-known to palliative medicine team from prior hospitalizations and follows with outpatient PMT at [facility].   Recommendations/Plan: # Complex medical decision making/goals of care:    - Patient has continued to express that she wants a pain controlled while wanting to live.  Patient continues to take cancer directed therapies that are offered.  Oncology following to provide recommendations.                -  Code Status: Full Code    # Symptom management Patient is receiving these palliative interventions for symptom management with an intent to improve quality of life.                 - Pain, severe acute on chronic pain in setting of stage IV colorectal cancer Within the past 24 hours hours, patient has required 45.6 mg of IV Dilaudid  via PCA for opioid management.                               -  Continue fentanyl  patch to 100 mcg/h every 72 hours                               - Continue IV Dilaudid  PCA with bolus only dosing of 0.6 mg every 10 minutes as needed.                               - Continue oral dexamethasone  4 mg daily - Outpatient PMT provider has been assisting with prior authorizations for pain medications.  Insurance will only  cover patient taking oral Dilaudid  6 times a day.  Patient is not scheduled for pain pump placement until October; patient on waiting list for cancellation.    - Planning for test dose today for possible pain pump placement in October.                  - Constipation, acute on chronic Patient noted to have BM on 09/22/2023.                               - Continue lactulose  30 mg daily.  Discussed with patient she can refuse if has had a bowel movement.                               - Discontinue lactulose  10 mg at bedtime                                           - Continue senna 3 tab twice daily   # Psycho-social/Spiritual Support:  - Support System: Mother- Parthenia  # Discharge Planning: To Be Determined  Discussed with: Patient, RN, pharmacist, outpatient PMT  Thank you for allowing the palliative care team to participate in the care [facility].  Tinnie Radar, DO Palliative Care Provider PMT # [phone]  If patient remains symptomatic despite maximum doses, please call PMT at [phone] between 0700 and 1900. Outside of these hours, please call attending, as PMT does not have night coverage.

## 2023-09-23 NOTE — Progress Notes (Signed)
 PHARMACY - PHYSICIAN COMMUNICATION CRITICAL VALUE ALERT - BLOOD CULTURE IDENTIFICATION (BCID)  April [name] is an 48 y.o. female who presented to [facility] on 09/20/2023 with intractable abdominal pain and nausea/vomiting.  Assessment: Gram + rods in 1 out of 4 bottles; patient afebrile and WBC wnl  Name of physician (or Provider) Contacted: Lavanda Horns, FNP  Current antibiotics: none  Changes to prescribed antibiotics recommended:  No changes at this time  Results for orders placed or performed during the hospital encounter of 09/20/23  Blood Culture ID Panel (Reflexed) (Collected: 09/20/2023  9:53 PM)  Result Value Ref Range   Enterococcus faecalis NOT DETECTED NOT DETECTED   Enterococcus Faecium NOT DETECTED NOT DETECTED   Listeria monocytogenes NOT DETECTED NOT DETECTED   Staphylococcus species DETECTED (A) NOT DETECTED   Staphylococcus aureus (BCID) NOT DETECTED NOT DETECTED   Staphylococcus epidermidis NOT DETECTED NOT DETECTED   Staphylococcus lugdunensis NOT DETECTED NOT DETECTED   Streptococcus species NOT DETECTED NOT DETECTED   Streptococcus agalactiae NOT DETECTED NOT DETECTED   Streptococcus pneumoniae NOT DETECTED NOT DETECTED   Streptococcus pyogenes NOT DETECTED NOT DETECTED   A.calcoaceticus-baumannii NOT DETECTED NOT DETECTED   Bacteroides fragilis NOT DETECTED NOT DETECTED   Enterobacterales NOT DETECTED NOT DETECTED   Enterobacter cloacae complex NOT DETECTED NOT DETECTED   Escherichia coli NOT DETECTED NOT DETECTED   Klebsiella aerogenes NOT DETECTED NOT DETECTED   Klebsiella oxytoca NOT DETECTED NOT DETECTED   Klebsiella pneumoniae NOT DETECTED NOT DETECTED   Proteus species NOT DETECTED NOT DETECTED   Salmonella species NOT DETECTED NOT DETECTED   Serratia marcescens NOT DETECTED NOT DETECTED   Haemophilus influenzae NOT DETECTED NOT DETECTED   Neisseria meningitidis NOT DETECTED NOT DETECTED   Pseudomonas aeruginosa NOT DETECTED NOT  DETECTED   Stenotrophomonas maltophilia NOT DETECTED NOT DETECTED   Candida albicans NOT DETECTED NOT DETECTED   Candida auris NOT DETECTED NOT DETECTED   Candida glabrata NOT DETECTED NOT DETECTED   Candida krusei NOT DETECTED NOT DETECTED   Candida parapsilosis NOT DETECTED NOT DETECTED   Candida tropicalis NOT DETECTED NOT DETECTED   Cryptococcus neoformans/gattii NOT DETECTED NOT DETECTED    Kemp Arvin Fletcher, PharmD 09/23/2023  11:26 PM

## 2023-09-23 NOTE — Progress Notes (Signed)
 IP PROGRESS NOTE  Subjective:   Ms. Mcgaughey appears stable.  Her pain is less adequately controlled with the current narcotic regimen.  She had bowel movements yesterday.  She continues to have intermittent sweats.  Objective: Vital signs in last 24 hours: Blood pressure (!) 168/128, pulse 76, temperature 98.7 F (37.1 C), resp. rate 17, height 5' 4 (1.626 m), weight 119 lb 12.8 oz (54.3 kg), last menstrual period 01/07/2006, SpO2 100%.  Intake/Output from previous day: 09/09 0701 - 09/10 0700 In: 1875 [P.O.:840; I.V.:1035] Out: -   Physical Exam:  HEENT: No thrush  Abdomen: Mildly distended, no mass, nontender Extremities: No leg edema   Portacath/PICC-without erythema  Lab Results: Recent Labs    09/22/23 0352 09/23/23 0333  WBC 11.0* 10.2  HGB 10.2* 10.0*  HCT 33.7* 33.0*  PLT 298 314    BMET Recent Labs    09/22/23 0352 09/23/23 0333  NA 134* 130*  K 4.1 4.2  CL 99 94*  CO2 25 26  GLUCOSE 85 94  BUN <5* 6  CREATININE 0.37* 0.38*  CALCIUM  9.0 9.6    Lab Results  Component Value Date   CEA1 <0.6 03/23/2022   CEA 124.85 (H) 07/30/2023   CAN199 213 (H) 03/23/2022    Studies/Results: No results found.  Medications: I have reviewed the patient's current medications.  Assessment/Plan: Sigmoid colon cancer, stage IV (pT4a,pN2b,M1c) Colonoscopy 03/24/2019-3 rectal polyps-hyperplastic polyps, distal colon biopsy-at least intramucosal adenocarcinoma, completely obstructing mass in the distal sigmoid colon, could not be traversed, intact mismatch repair protein expression 03/24/2019-CEA 56.1 03/29/2019 CT abdomen/pelvis-circumferential thickening involving the entire mid and distal sigmoid colon to the level of the rectosigmoid junction, solid/cystic mass in the left ovary, bilateral nephrolithiasis Robotic assisted low anterior resection, mesenteric lymphadenectomy, bilateral salpingo-oophorectomy 04/08/2019 Pathology ([facility] review of outside pathology)  metastatic adenocarcinoma involving the peritoneum overlying the round ligament, serosa of the urinary bladder, serosa of the right ureter a sacral area, pelvic peritoneum, and left ovary.  Omental biopsy with focal mucin pools with no carcinoma cells identified, right hemidiaphragm biopsy involved by metastatic adenocarcinoma, invasive adenocarcinoma the sigmoid colon, moderately differentiated, T4a, perineural and vascular invasion present, 7/22 lymph nodes, multiple tumor deposits, resection margins negative Negative for PD-L1, low probability of MSI-high, HER-2 negative, negative for BRAF, NRAS and KRAS alterations CTs 05/19/2019-no evidence of metastatic disease, findings suspicious for colitis of the transverse and ascending colon, small amount of ascites in the cul-de-sac, bilateral renal calculi Cycle 1 FOLFIRI 05/24/2019, bevacizumab  added with cycle 2 Cycle 4 FOLFIRI/bevacizumab  07/05/2019 Cycle 5 FOLFIRI 07/27/2019, bevacizumab  held secondary to hypertension Cycle 6 FOLFIRI 08/10/1999, bevacizumab  held secondary to hypertension CTs 08/30/2019-no evidence of recurrent disease, new subsolid right lower lobe nodule felt to be inflammatory, emphysema Cycle 7 FOLFIRI 09/26/2019, bevacizumab  remains on hold secondary to hypertension Cycle 8 FOLFIRI 10/17/2019, bevacizumab  held secondary to hypertension, Udenyca  added for neutropenia Cycle 9 FOLFIRI 11/07/2019, bevacizumab  held secondary to hypertension, Udenyca  Cycle 10 FOLFIRI 11/28/2019, bevacizumab  held, Udenyca  Cycle 11 FOLFIRI 12/26/2019, bevacizumab  held, Udenyca  CTs 01/25/2020-no evidence of recurrent disease, resolution of right lower lobe nodule Maintenance Xeloda  beginning 02/06/2020 CTs 04/25/2020- no evidence of metastatic disease, multiple bilateral renal calculi without hydronephrosis Maintenance Xeloda  continued CT abdomen/pelvis without contrast 08/10/2020-bilateral staghorn renal calculi, no evidence of metastatic disease; addendum  08/23/2020-small but increasing omental nodules. Cycle 1 FOLFIRI/panitumumab  09/19/2020 Cycle 2 FOLFIRI/Panitumumab  10/03/2020 Cycle 3 FOLFIRI/Panitumumab  10/17/2020 Cycle 4 FOLFIRI/panitumumab  10/31/2020 Cycle 5 FOLFIRI/Panitumumab  11/14/2020 CTs 11/26/2020-stable omental metastases compared to 10/22/2020, mildly decreased from  08/10/2020.  Left lower quadrant tiny paracolic gutter implant slightly decreased from CT 08/10/2020.  No new or progressive metastatic disease in the abdomen or pelvis. Cycle 6 FOLFIRI/Panitumumab  11/28/2020, irinotecan  dose reduced, treatment schedule adjusted to every 3 weeks going forward Cycle 7 FOLFIRI/panitumumab  12/24/2020 Cycle 8 FOLFIRI/Panitumumab  01/16/2021--treatment held due to hypertension in the infusion area. Cycle 8 FOLFIRI/panitumumab  01/21/2021 Cycle 9 FOLFIRI/panitumumab  02/11/2021 Cycle 10 FOLFIRI/Panitumumab  03/04/2021 Cycle 11 FOLFIRI/Panitumumab  03/25/2021 CT abdomen/pelvis 04/10/2021-stable right omental and left posterior paracolic gutter nodules Cycle 12 FOLFIRI/panitumumab  04/15/2021 Cycle 13 FOLFIRI/panitumumab  05/06/2021 Cycle 14 FOLFIRI/Panitumumab  05/27/2021 Cycle 15 FOLFIRI/Panitumumab  06/17/2021 CT abdomen/pelvis 07/05/2021-slight enlargement of a dominant right omental implant, no new implants Patient requested a treatment break CT abdomen/pelvis 10/04/2021-mild increase in size of multiple omental soft tissue nodules, 4 mm calculus in the distal left ureter adjacent to the ureteral stent Cycle 1 Lonsurf  10/14/2021 10/28/2021 Avastin  every 2 weeks Cycle 2 Lonsurf  11/11/2021 Cycle 3 Lonsurf  12/09/2021 Cycle 4 Lonsurf  01/06/2022 Avastin  held 01/20/2022 due to proteinuria, 24-hour urine 43 mg CT/pelvis 01/30/2022-possible new peritoneal implant at the transverse colon, no significant change in other omental/peritoneal implants, stable chronic rectal wall thickening, status post removal of double-J left ureteral stent with no evidence of hydronephrosis or  ureteral calculus, nonobstructing bilateral renal calculi Cycle 5 Lonsurf  02/05/2022 or 02/06/2022 Avastin  every 2 weeks Cycle 6 Lonsurf  03/03/2022 CTs 04/05/2022-increase in size and number of peritoneal implants compared to January 2024 central liver mass narrowing the anterior branch of the right portal vein, decreased biliary duct dilation, biliary stents in place, stable right cardiophrenic lymph nodes, separate pigtail stent in the lumen of the proximal duodenum Cycle 1 FOLFOX 04/14/2022 CT abdomen/pelvis 04/16/2022-stent within the third portion of the duodenum, stable peritoneal nodules, indwelling biliary stents with mild left biliary duct dilation Cycle 2 FOLFOX 04/28/2022, Emend and prophylactic dexamethasone  added Cycle 3 FOLFOX 05/12/2022, Aloxi , Emend, prophylactic dexamethasone , Compazine  and lorazepam  as needed Chemotherapy held 05/25/2020 due to severe hypertension, evaluated in the emergency department Cycle 4 FOLFOX 06/03/2022 Cycle 5 FOLFOX 06/16/2022, oxaliplatin  dose reduced and 5-FU bolus eliminated due to neutropenia CT 06/25/2022-unchanged soft tissue at the hepatic hilum, unchanged, bile duct stent, decreased size of peritoneal and omental nodules and diminished volume of likely fluid in the lower left pelvis Cycle 6 FOLFOX 06/30/2022 Cycle 7 FOLFOX 07/21/2022 CT 07/24/2022 (in the emergency department with nausea, headache, abdominal pain)-no acute pathology.  No significant interval change in omental nodularity. Cycle 8 FOLFOX 08/04/2022, pruritus on the palms at the end of the oxaliplatin  infusion, Pepcid  and Benadryl  given, symptoms resolved, infusion not resumed Cycle 9 FOLFOX 08/18/2022-Pepcid  and Benadryl  added to premedications, Oxaliplatin  diluted in a larger volume and infusion time increased Cycle 10 FOLFOX 09/08/2022 Cycle 11 FOLFOX 09/22/2022 10/06/2022 patient declined treatment CTs 10/15/2022-similar soft tissue fullness in the hepatic hilum with biliary stents in place, decrease  in omental/peritoneal metastases Maintenance capecitabine  10/26/2022 CT abdomen/pelvis 11/19/2022-mild increase in tumor studding at the left paracolic gutter and omentum, circumferential wall thickening of the rectum, biliary stents in place with minimal increase in intrahepatic biliary dilation, nonobstructive renal calculi Colonoscopy 11/23/2022-anastomosis at 15 cm from the anal verge-patent, 2 cm polypoid tissue at the anastomosis biopsied, invasive adenocarcinoma moderately differentiated, preserved expression of major MMR proteins CTs 12/15/2022: Possible developing nodule in the lateral left liver, increased omental nodularity, persistent wall thickening in the upper rectum CT abdomen/pelvis 12/24/2022: Increased thickened fold in the transverse colon-infectious/inflammatory, 2.1 cm lateral left liver lesion, potential new 5 mm segment 5 lesion, stable biliary stents  with intrahepatic biliary dilation, central biliary wall thickening near the hepatic hilum, multiple peritoneal/omental implants unchanged from 12/15/2022, but likely larger than 11/19/2022 Cycle 1 irinotecan /Panitumumab  01/06/2023 Cycle 2 irinotecan /Panitumumab  01/20/2023 CTs 01/24/2023-slight increase in omental nodularity. Cycle 3 irinotecan /Panitumumab  02/03/2023 Cycle 4 irinotecan /panitumumab  02/17/2023 CTs abdomen/pelvis 02/24/2023-subtly hypoattenuating lesion within the peripheral aspect of hepatic segment 2 redemonstrated.  Additional scattered subcentimeter low-density lesions within the liver too small to characterize.  Prominent intrahepatic biliary dilatation with biliary stents in place.  Similar degree of nodularity along the omentum and left paracolic gutter.  No ascites. CTs 03/09/2023-indwelling endoscopically placed stents; level of intrahepatic biliary duct dilatation is slightly improved on the left and some on the right.  Gallbladder is dilated with wall edema which is increasing.  More nodular tissue extending up along the  porta hepatis.  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  Rectum with nodular wall thickening with extension into the adjacent tissue, nodular areas seen extending perirectal and presacral spaces.  Peritoneal carcinomatosis again identified. Irinotecan /Panitumumab  04/02/2023 Irinotecan /panitumumab  04/16/2023 Irinotecan /Panitumumab  05/07/2023 Irinotecan /Panitumumab  06/19/2023 CT abdomen/pelvis 06/21/2023-stable heterogeneous enhancement throughout the liver with nodularity along the surface of the liver.  Small amount of perihepatic ascites.  Stable nodularity right abdomen.  Circumferential rectal wall thickening/mass unchanged. Irinotecan /Panitumumab  discontinued 07/10/2023 due to lack of clinical benefit/poor tolerance Appointment with Dr. Sheilda 07/23/2023 CTs 08/02/2023-mid rectal soft tissue mass, mildly progressive.  Right hepatic metastases, progressive.  Peritoneal disease/omental caking stable versus mildly progressive.  Small volume abdominopelvic ascites mildly progressive. Cycle 1 FOLFOX 08/05/2023 Cycle 2 FOLFOX 08/20/2023 (Pepcid  and Benadryl  daily x 2 days before treatment, Pepcid  continued for 2 days post oxaliplatin ; oxaliplatin  administration time increased)     2. Hypertension G4 P3, twins Kidney stones-bilateral staghorn renal calculi on CT 08/10/2020 Infected Port-A-Cath 09/12/2019-placed on Augmentin , referred for Port-A-Cath removal; Port-A-Cath removed 09/14/2019; PICC line placed 09/14/2019; culture staph aureus.  Course of Septra  completed. Neutropenia secondary to chemotherapy-Udenyca  added with cycle 8 FOLFIRI Right nephrostomy tube 09/12/2020; stent placed 10/09/2020, percutaneous nephrostolithotomy treatment of right-sided kidney stones 10/09/2020, malpositioned right ureter stent replacement 10/23/2020, right ureter stent removed 10/29/2020 Percutaneous left nephrostolithotomy 10/01/2021-no renal stone identified, left ureter stent left in place Cystoscopy 10/14/2021-stent  removed 8.  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  2 moderate biliary strictures found in the hepatic duct system, malignant appearing.  Left and right hepatic duct and all intrahepatic branches were moderately dilated secondary to the strictures.  The strictures were dilated.  Plastic stent placed into the left hepatic duct and right hepatic duct. 9.  Admission 04/16/2022 with nausea/vomiting and abdominal pain, felt to be acute toxicity related to chemotherapy, discharged home 04/18/2022 10.  Admission 11/19/2022 with nausea/vomiting and abdominal pain-improved 11.  Admission 12/15/2022 with fever and right submandibular swelling/tenderness CT neck 12/15/2022: 2 mm distal right submandibular duct calculus with mild inflammatory change adjacent to the right submandibular gland 12.  Admission 12/24/2022 with a fever nausea/vomiting, diarrhea, Klebsiella bacteremia completed course of Augmentin  13.  Admission with abdominal pain and nausea/vomiting 03/09/2023 through  03/14/2023-concern for gallbladder inflammation.  Not felt to be a surgical candidate.  Percutaneous cholecystostomy tube placed 03/11/2023.  Tube removed 04/24/2023. 14.  Admission 06/20/2023 with abdominal pain and constipation, pain improved partially following a bowel movement 15.  Admission 08/02/2023 with abdominal pain, cycle 1 salvage FOLFOX 08/05/2023 16.   Admission 09/20/2023 with nausea and abdominal pain  Ms. Maners appears unchanged.  I discussed the CT of and treatment options with her and again this morning.  The CEA is pending.  The plan is to resume treatment with FOLFOX as an outpatient.  She notes persistent severe hypertension.  She has been maintained on a multi agent hypertension regimen in the past  The sweats are likely related to the metastatic tumor burden and polypharmacy.  Recommendations: Management of hypertension per the medical service Continue Duragesic , convert IV to oral Dilaudid  for the palliative care service Bowel regimen Outpatient follow-up at the Cancer center for an office visit and  FOLFOX chemotherapy during the week of 09/28/2023   LOS: 2 days   Arley Hof, MD   09/23/2023, 7:25 AM

## 2023-09-23 NOTE — Procedures (Signed)
 Technically successful fluoroscopically-guided L4-L5 lumbar puncture with intrathecal morphine  injection. See radiology report for details. No immediate post-procedure complication.

## 2023-09-23 NOTE — Progress Notes (Signed)
  Progress Note   Patient: April [name] FMW:[id number] DOB: May 12, 1975 DOA: 09/20/2023     2 DOS: the patient was seen and examined on 09/23/2023   Brief hospital course:   Assessment and Plan: Intractable pain secondary to malignancy Sigmoid colon cancer stage IV Nausea and vomiting Continue pain control as per palliative medicine, currently on Dilaudid  PCA.  Hopefully can find solution for can be tolerated at home  Normocytic anemia Stable     Subjective:  Feels ok on PCA  Physical Exam: Vitals:   09/23/23 0052 09/23/23 0510 09/23/23 0525 09/23/23 1049  BP:   (!) 168/128   Pulse:   76   Resp: 15 (!) 8 17 18   Temp:   98.7 F (37.1 C)   TempSrc:      SpO2: 98%  100%   Weight:      Height:       Physical Exam Vitals reviewed.  Constitutional:      General: She is not in acute distress.    Appearance: She is not ill-appearing or toxic-appearing.  Cardiovascular:     Rate and Rhythm: Normal rate and regular rhythm.     Heart sounds: No murmur heard. Pulmonary:     Effort: Pulmonary effort is normal. No respiratory distress.     Breath sounds: No wheezing, rhonchi or rales.  Neurological:     Mental Status: She is alert.  Psychiatric:        Mood and Affect: Mood normal.        Behavior: Behavior normal.     Data Reviewed: Na+ 130 Hgb 10.0  Family Communication: none  Disposition: Status is: Inpatient Remains inpatient appropriate because: pain control     Time spent: 35 minutes  Author: Toribio Door, MD 09/23/2023 11:20 AM  For on call review [email or web address].

## 2023-09-23 NOTE — Hospital Course (Signed)
 Intractable abdominal pain and N/V in pt with metastatic colon cancer who was unable to keep her oral pain meds down and unable to afford hydromorphone . Palliative and Oncology following. Adjusting BP meds    48 year old with past medical history significant for stage IV colorectal cancer with metastasis, hypertension, cancer related chronic pain who presented with increased abdominal pain nausea and vomiting.   Patient is undergoing treatment of metastatic colon cancer, but missed cycle 3 on 09/17/18/2025 due to worsening of chronic abdominal pain along with nausea and vomiting.   Evaluation in the ED patient was noted to have a potassium of 2.7, normal creatinine, elevated alkaline phosphatase 624, white blood cell 12.  CT abdomen and pelvis showed new left hydroureteronephrosis without clear etiology and suspected increase in the right lower lobe metastatic disease in addition to other stable findings.   Admitted with FFT, dehydration and pain management. Palliative care and oncology following.

## 2023-09-24 ENCOUNTER — Other Ambulatory Visit (HOSPITAL_COMMUNITY): Payer: Self-pay

## 2023-09-24 DIAGNOSIS — C19 Malignant neoplasm of rectosigmoid junction: Secondary | ICD-10-CM | POA: Diagnosis not present

## 2023-09-24 DIAGNOSIS — D649 Anemia, unspecified: Secondary | ICD-10-CM | POA: Diagnosis not present

## 2023-09-24 DIAGNOSIS — R112 Nausea with vomiting, unspecified: Secondary | ICD-10-CM | POA: Diagnosis not present

## 2023-09-24 DIAGNOSIS — G893 Neoplasm related pain (acute) (chronic): Secondary | ICD-10-CM | POA: Diagnosis not present

## 2023-09-24 DIAGNOSIS — Z515 Encounter for palliative care: Secondary | ICD-10-CM | POA: Diagnosis not present

## 2023-09-24 DIAGNOSIS — Z79899 Other long term (current) drug therapy: Secondary | ICD-10-CM | POA: Diagnosis not present

## 2023-09-24 MED ORDER — FENTANYL 75 MCG/HR TD PT72
1.0000 | MEDICATED_PATCH | TRANSDERMAL | Status: DC
  Administered 2023-09-24: 1 via TRANSDERMAL
  Filled 2023-09-24: qty 1

## 2023-09-24 MED ORDER — HYDROMORPHONE HCL 1 MG/ML IJ SOLN
1.0000 mg | INTRAMUSCULAR | Status: DC | PRN
  Administered 2023-09-24 – 2023-09-25 (×5): 1 mg via INTRAVENOUS
  Filled 2023-09-24 (×5): qty 1

## 2023-09-24 MED ORDER — HYDROMORPHONE HCL 1 MG/ML IJ SOLN
1.0000 mg | INTRAMUSCULAR | Status: DC | PRN
  Administered 2023-09-24 (×3): 1 mg via INTRAVENOUS
  Filled 2023-09-24 (×3): qty 1

## 2023-09-24 MED ORDER — HYDROMORPHONE HCL 2 MG PO TABS
8.0000 mg | ORAL_TABLET | ORAL | Status: DC | PRN
  Administered 2023-09-24 – 2023-09-25 (×6): 8 mg via ORAL
  Filled 2023-09-24 (×6): qty 4

## 2023-09-24 NOTE — Progress Notes (Addendum)
 Patient states that her pain level is still 8/10 after 8 mg PO diluadid. She stated the 1mg  IV diluadid for breakthrough hasn't been working. Notified A. Andrez, NP. Changed diluadid I mg IV from every 4 hours to every 3 hours. She stated it is okay to give 1st dose now d/t pain being so high. Will give and continue to monitor pain.

## 2023-09-24 NOTE — Progress Notes (Signed)
  Progress Note   Patient: April [name] FMW:[id number] DOB: Jun 27, 1975 DOA: 09/20/2023     3 DOS: the patient was seen and examined on 09/24/2023   Brief hospital course: Consultants Palliative Care Medicine  Procedures/Events 9/10 Technically successful fluoroscopically-guided L4-L5 lumbar puncture with intrathecal morphine  injection.   Assessment and Plan: Intractable pain secondary to malignancy Sigmoid colon cancer stage IV Nausea and vomiting Continue pain control as per palliative medicine, now off Dilaudid  PCA.  Hopefully can find solution for can be tolerated at home   Mild hyponatremia, can follow clinically  Normocytic anemia Stable      Subjective:  No events charted overnight  Feels okay today  Physical Exam: Vitals:   09/24/23 0812 09/24/23 1245 09/24/23 1247 09/24/23 1335  BP: (!) 174/123   (!) 149/106  Pulse: 92   81  Resp:  10 15 17   Temp:    98.4 F (36.9 C)  TempSrc:    Oral  SpO2:    100%  Weight:      Height:       Physical Exam Vitals (Mildly hypertensive) reviewed.  Constitutional:      General: She is not in acute distress.    Appearance: She is ill-appearing. She is not toxic-appearing.  Cardiovascular:     Rate and Rhythm: Normal rate and regular rhythm.     Heart sounds: No murmur heard. Pulmonary:     Effort: Pulmonary effort is normal. No respiratory distress.     Breath sounds: No wheezing, rhonchi or rales.  Neurological:     Mental Status: She is alert.  Psychiatric:        Mood and Affect: Mood normal.        Behavior: Behavior normal.     Data Reviewed: No new data  Family Communication: none  Disposition: Status is: Inpatient Remains inpatient appropriate because: pain control     Time spent: 20 minutes  Author: Toribio Door, MD 09/24/2023 7:17 PM  For on call review [email or web address].

## 2023-09-24 NOTE — Progress Notes (Signed)
 Daily Progress Note   Patient Name: April [name]       Date: 09/24/2023 DOB: 03/27/1975  Age: 48 y.o. MRN#: [id number] Attending Physician: Jadine Toribio SQUIBB, MD Primary Care Physician: Oris Camie BRAVO, NP Admit Date: 09/20/2023 Length of Stay: 3 days  Reason for Consultation/Follow-up: Pain control  Subjective:   CC: Patient notes pain better controlled today.  Following up regarding symptom management.  Subjective:  Reviewed EMR including recent documentation from hospitalist. At time of EMR review in past 24 hours, patient has used IV dilaudid  via PCA 0.6mg  x 37 boluses.   Presented to bedside to see patient. Patient resting comfortably in bed. Discussed symptom burden with patient. Patient feels her pain management has continued to overall improved. Patient had test dose of morphine  for possible pain pump placement in October and she feels it did help with managing her pain. Discussed that based on her OME requirement use from PCA in past 24 hours, could appropriately increase fentanyl  patch to 150mcg/hr q 72 hours. Patient agreeing with increase in fentanyl  patch. Also discussed stopping IV dilaudid  PCA today and transitioning over to oral dilaudid . Patient's insurance will only cover one tablet every 4 hours (for 6 doses) in 24 hours. Patient agreeing with this plan. Hoping she can go home soon if pain improved.   Also discussed importance of continued bowel movements since constipation worsens her pain and leads to her returning to the hospital. Patient notes she did have a small BM this morning. Discussed continuing to take lactulose  daily when needed for bowel movement as do not want to wait until she is constipated.   All questions answered at that time. Noted PMT would continue to follow along with patient's medical journey.  Discussed care with hospitalist, RN, pharmacist, and outpatient PMT to coordinate care.  Objective:   Vital Signs:  BP (!) 162/123 (BP  Location: Left Arm)   Pulse 89   Temp 98.2 F (36.8 C)   Resp 18   Ht 5' 4 (1.626 m)   Wt 54.3 kg   LMP 01/07/2006   SpO2 100%   BMI 20.56 kg/m   Physical Exam: General: NAD, alert, frail, cachectic Cardiovascular: RRR Respiratory: no increased work of breathing noted, not in respiratory distress Abdomen: distended Neuro: A&Ox4, following commands easily Psych: appropriately answers all questions   Assessment & Plan:   Assessment: Patient is a 48 year old female with past medical history of stage IV colorectal cancer with metastatic disease to liver and peritoneum complicated by biliary obstruction status post biliary stenting currently on chemotherapy, hypertension, COPD, aortic aneurysm, and prolonged QTc who was admitted 09/20/2023 for management of worsening abdominal pain, nausea, and vomiting.  Imaging obtained demonstrated new mild left hydroureteronephrosis without clear etiology and suspected increase in metastatic disease burden in liver.  Palliative medicine team consulted to assist with pain management. Of note, patient well-known to palliative medicine team from prior hospitalizations and follows with outpatient PMT at [facility].   Recommendations/Plan: # Complex medical decision making/goals of care:    - Patient has continued to express that she wants a pain controlled while wanting to live.  Patient continues to take cancer directed therapies that are offered.  Oncology following planning for follow-up in outpatient setting regarding further cancer directed therapies.                -  Code Status: Full Code    # Symptom management Patient is receiving these palliative interventions for symptom management with an intent  to improve quality of life.                 - Pain, severe acute on chronic pain in setting of stage IV colorectal cancer Within the past 24 hours hours, patient has required 22mg  IV dilaudid  for as needed opioid management. Based on OMEs calculated for  this dose, which would be approximately 139 OMEs when reducing by 50% for incomplete cross tolerance, will appropriately start patient on the listed regimen below.                                - Increase fentanyl  patch to 150 mcg/h every 72 hours (Is ordered as 75mcg/hr patch x 2; discussed with pharmacy)                               - Discontinue IV Dilaudid      - Start oral dilaudid  8mg  tab q 4 hours as needed    - Start IV dilaudid  1mg  q 4 hours as needed for breakthrough pain only after oral dilaudid                                 - Continue oral dexamethasone  4 mg daily - Outpatient PMT provider has been assisting with prior authorizations for pain medications.  Insurance will only cover patient taking oral Dilaudid  6 times a day.  Patient is not scheduled for pain pump placement until October; patient on waiting list for cancellation.    - Planning for possible pain pump placement in October.                  - Constipation, acute on chronic Patient noted to have BM on 09/22/2023.                               - Continue lactulose  30 mg daily.  Discussed with patient she can refuse if has had a bowel movement.                                       - Continue senna 3 tab twice daily   # Psycho-social/Spiritual Support:  - Support System: Mother- Parthenia  # Discharge Planning: Home with Palliative Services follow up at [facility]  Discussed with: Patient, RN, pharmacist, outpatient PMT, hospitalist   Thank you for allowing the palliative care team to participate in the care [facility].  Tinnie Radar, DO Palliative Care Provider PMT # [phone]  If patient remains symptomatic despite maximum doses, please call PMT at [phone] between 0700 and 1900. Outside of these hours, please call attending, as PMT does not have night coverage.

## 2023-09-24 NOTE — Plan of Care (Signed)

## 2023-09-25 ENCOUNTER — Other Ambulatory Visit (HOSPITAL_COMMUNITY): Payer: Self-pay

## 2023-09-25 ENCOUNTER — Encounter: Payer: Self-pay | Admitting: Oncology

## 2023-09-25 ENCOUNTER — Other Ambulatory Visit (HOSPITAL_BASED_OUTPATIENT_CLINIC_OR_DEPARTMENT_OTHER): Payer: Self-pay

## 2023-09-25 ENCOUNTER — Other Ambulatory Visit: Payer: Self-pay

## 2023-09-25 ENCOUNTER — Telehealth (HOSPITAL_COMMUNITY): Payer: Self-pay | Admitting: Pharmacy Technician

## 2023-09-25 ENCOUNTER — Encounter

## 2023-09-25 DIAGNOSIS — G893 Neoplasm related pain (acute) (chronic): Secondary | ICD-10-CM | POA: Diagnosis not present

## 2023-09-25 DIAGNOSIS — Z7189 Other specified counseling: Secondary | ICD-10-CM | POA: Diagnosis not present

## 2023-09-25 DIAGNOSIS — R112 Nausea with vomiting, unspecified: Secondary | ICD-10-CM | POA: Diagnosis not present

## 2023-09-25 DIAGNOSIS — C19 Malignant neoplasm of rectosigmoid junction: Secondary | ICD-10-CM | POA: Diagnosis not present

## 2023-09-25 DIAGNOSIS — Z515 Encounter for palliative care: Secondary | ICD-10-CM | POA: Diagnosis not present

## 2023-09-25 LAB — CULTURE, BLOOD (ROUTINE X 2)
Culture: NO GROWTH
Special Requests: ADEQUATE

## 2023-09-25 MED ORDER — LACTULOSE 10 GM/15ML PO SOLN
30.0000 g | Freq: Every day | ORAL | 2 refills | Status: DC
  Filled 2023-09-25: qty 946, 21d supply, fill #0

## 2023-09-25 MED ORDER — CLONIDINE HCL 0.2 MG PO TABS: 0.3000 mg | ORAL_TABLET | Freq: Three times a day (TID) | ORAL | Status: DC

## 2023-09-25 MED ORDER — CHLORTHALIDONE 25 MG PO TABS
25.0000 mg | ORAL_TABLET | Freq: Every day | ORAL | Status: DC
  Administered 2023-09-25: 25 mg via ORAL
  Filled 2023-09-25: qty 1

## 2023-09-25 MED ORDER — HYDROMORPHONE HCL 1 MG/ML IJ SOLN
2.0000 mg | Freq: Once | INTRAMUSCULAR | Status: AC
  Administered 2023-09-25: 2 mg via INTRAVENOUS
  Filled 2023-09-25: qty 2

## 2023-09-25 MED ORDER — DEXAMETHASONE 4 MG PO TABS
4.0000 mg | ORAL_TABLET | Freq: Every day | ORAL | 1 refills | Status: DC
  Filled 2023-09-25: qty 30, 30d supply, fill #0

## 2023-09-25 MED ORDER — DICYCLOMINE HCL 20 MG PO TABS: 20.0000 mg | ORAL_TABLET | Freq: Three times a day (TID) | ORAL | Status: DC | PRN

## 2023-09-25 MED ORDER — HYDRALAZINE HCL 20 MG/ML IJ SOLN: 5.0000 mg | Freq: Four times a day (QID) | INTRAMUSCULAR | Status: DC | PRN

## 2023-09-25 MED ORDER — SENNOSIDES-DOCUSATE SODIUM 8.6-50 MG PO TABS: 3.0000 | ORAL_TABLET | Freq: Two times a day (BID) | ORAL | Status: DC

## 2023-09-25 MED ORDER — HYDROMORPHONE HCL 8 MG PO TABS
8.0000 mg | ORAL_TABLET | ORAL | 0 refills | Status: DC | PRN
  Filled 2023-09-25: qty 120, 20d supply, fill #0

## 2023-09-25 MED ORDER — FENTANYL 75 MCG/HR TD PT72
MEDICATED_PATCH | TRANSDERMAL | 0 refills | Status: DC
  Filled 2023-09-25: qty 10, 15d supply, fill #0

## 2023-09-25 MED ORDER — CLONIDINE HCL 0.1 MG PO TABS
0.1000 mg | ORAL_TABLET | Freq: Two times a day (BID) | ORAL | Status: DC
  Administered 2023-09-25: 0.1 mg via ORAL
  Filled 2023-09-25: qty 1

## 2023-09-25 NOTE — Care Management Important Message (Signed)
 Important Message  Patient Details IM Letter given. Name: April [name] MRN: [id number] Date of Birth: 1975-08-24   Important Message Given:  Yes - Medicare IM     April [name] 09/25/2023, 2:20 PM

## 2023-09-25 NOTE — Progress Notes (Signed)
 IP PROGRESS NOTE  Subjective:   Ms. Hussar reports adequate pain control with the current narcotic regimen.  She is having bowel movements.  She is ambulating.  She continues to take oral and IV Dilaudid  for breakthrough pain.  Objective: Vital signs in last 24 hours: Blood pressure (!) 170/126, pulse 78, temperature 98.3 F (36.8 C), temperature source Oral, resp. rate 17, height 5' 4 (1.626 m), weight 119 lb 12.8 oz (54.3 kg), last menstrual period 01/07/2006, SpO2 100%.  Intake/Output from previous day: 09/11 0701 - 09/12 0700 In: 410 [P.O.:360; IV Piggyback:50] Out: -   Physical Exam:  HEENT: No thrush  Abdomen: Distended, nontender, no mass Extremities: No leg edema   Portacath/PICC-without erythema  Lab Results: Recent Labs    09/23/23 0333  WBC 10.2  HGB 10.0*  HCT 33.0*  PLT 314    BMET Recent Labs    09/23/23 0333  NA 130*  K 4.2  CL 94*  CO2 26  GLUCOSE 94  BUN 6  CREATININE 0.38*  CALCIUM  9.6    Lab Results  Component Value Date   CEA1 190.0 (H) 09/22/2023   CEA 124.85 (H) 07/30/2023   CAN199 213 (H) 03/23/2022    Studies/Results: DG Fluoro Rm 1-60 Min - No Report Result Date: 09/23/2023 Fluoroscopy was utilized by the requesting physician.  No radiographic interpretation.   DG FL GUIDED LUMBAR PUNCTURE Result Date: 09/23/2023 CLINICAL DATA:  Provided history: Abdominal pain. Additional history: Request received for fluoroscopically-guided lumbar puncture with intrathecal morphine  injection (assessment prior to intrathecal pain pump placement). EXAM: DIAGNOSTIC LUMBAR PUNCTURE UNDER FLUOROSCOPIC GUIDANCE COMPARISON:  CT abdomen/pelvis 09/20/2023. FLUOROSCOPY: Radiation Exposure Index (as provided by the fluoroscopic device): 19.30 mGy Kerma PROCEDURE: Informed consent was obtained from the patient prior to the procedure. This process included a discussion of procedure risk. Time-out was performed. The patient was positioned prone on the  fluoroscopy table. An appropriate skin entry site was determined under fluoroscopy and marked. The operator donned sterile gloves and a mask. The lower back was prepped and draped in the usual sterile fashion. Local anesthesia was provided with 1% lidocaine . Under intermittent fluoroscopy, lumbar puncture was performed at the L4-L5 level using a 20 gauge spinal needle with return of clear/colorless CSF. An image was saved and sent to PACS. Subsequently, 0.5 mg (1 mL) of morphine  sulfate was injected into the thecal sac. The needle was then removed from the patient, and a dressing was applied at the skin entry site. The patient tolerated the procedure well and no immediate post-procedure complication was apparent. IMPRESSION: 1. Technically successful fluoroscopically-guided L4-L5 lumbar puncture with intrathecal morphine  injection, as described. 2. No immediate post-procedure complication. Electronically Signed   By: Rockey Childs D.O.   On: 09/23/2023 15:30    Medications: I have reviewed the patient's current medications.  Assessment/Plan: Sigmoid colon cancer, stage IV (pT4a,pN2b,M1c) Colonoscopy 03/24/2019-3 rectal polyps-hyperplastic polyps, distal colon biopsy-at least intramucosal adenocarcinoma, completely obstructing mass in the distal sigmoid colon, could not be traversed, intact mismatch repair protein expression 03/24/2019-CEA 56.1 03/29/2019 CT abdomen/pelvis-circumferential thickening involving the entire mid and distal sigmoid colon to the level of the rectosigmoid junction, solid/cystic mass in the left ovary, bilateral nephrolithiasis Robotic assisted low anterior resection, mesenteric lymphadenectomy, bilateral salpingo-oophorectomy 04/08/2019 Pathology ([facility] review of outside pathology) metastatic adenocarcinoma involving the peritoneum overlying the round ligament, serosa of the urinary bladder, serosa of the right ureter a sacral area, pelvic peritoneum, and left ovary.  Omental biopsy  with focal mucin pools with  no carcinoma cells identified, right hemidiaphragm biopsy involved by metastatic adenocarcinoma, invasive adenocarcinoma the sigmoid colon, moderately differentiated, T4a, perineural and vascular invasion present, 7/22 lymph nodes, multiple tumor deposits, resection margins negative Negative for PD-L1, low probability of MSI-high, HER-2 negative, negative for BRAF, NRAS and KRAS alterations CTs 05/19/2019-no evidence of metastatic disease, findings suspicious for colitis of the transverse and ascending colon, small amount of ascites in the cul-de-sac, bilateral renal calculi Cycle 1 FOLFIRI 05/24/2019, bevacizumab  added with cycle 2 Cycle 4 FOLFIRI/bevacizumab  07/05/2019 Cycle 5 FOLFIRI 07/27/2019, bevacizumab  held secondary to hypertension Cycle 6 FOLFIRI 08/10/1999, bevacizumab  held secondary to hypertension CTs 08/30/2019-no evidence of recurrent disease, new subsolid right lower lobe nodule felt to be inflammatory, emphysema Cycle 7 FOLFIRI 09/26/2019, bevacizumab  remains on hold secondary to hypertension Cycle 8 FOLFIRI 10/17/2019, bevacizumab  held secondary to hypertension, Udenyca  added for neutropenia Cycle 9 FOLFIRI 11/07/2019, bevacizumab  held secondary to hypertension, Udenyca  Cycle 10 FOLFIRI 11/28/2019, bevacizumab  held, Udenyca  Cycle 11 FOLFIRI 12/26/2019, bevacizumab  held, Udenyca  CTs 01/25/2020-no evidence of recurrent disease, resolution of right lower lobe nodule Maintenance Xeloda  beginning 02/06/2020 CTs 04/25/2020- no evidence of metastatic disease, multiple bilateral renal calculi without hydronephrosis Maintenance Xeloda  continued CT abdomen/pelvis without contrast 08/10/2020-bilateral staghorn renal calculi, no evidence of metastatic disease; addendum 08/23/2020-small but increasing omental nodules. Cycle 1 FOLFIRI/panitumumab  09/19/2020 Cycle 2 FOLFIRI/Panitumumab  10/03/2020 Cycle 3 FOLFIRI/Panitumumab  10/17/2020 Cycle 4 FOLFIRI/panitumumab  10/31/2020 Cycle 5  FOLFIRI/Panitumumab  11/14/2020 CTs 11/26/2020-stable omental metastases compared to 10/22/2020, mildly decreased from 08/10/2020.  Left lower quadrant tiny paracolic gutter implant slightly decreased from CT 08/10/2020.  No new or progressive metastatic disease in the abdomen or pelvis. Cycle 6 FOLFIRI/Panitumumab  11/28/2020, irinotecan  dose reduced, treatment schedule adjusted to every 3 weeks going forward Cycle 7 FOLFIRI/panitumumab  12/24/2020 Cycle 8 FOLFIRI/Panitumumab  01/16/2021--treatment held due to hypertension in the infusion area. Cycle 8 FOLFIRI/panitumumab  01/21/2021 Cycle 9 FOLFIRI/panitumumab  02/11/2021 Cycle 10 FOLFIRI/Panitumumab  03/04/2021 Cycle 11 FOLFIRI/Panitumumab  03/25/2021 CT abdomen/pelvis 04/10/2021-stable right omental and left posterior paracolic gutter nodules Cycle 12 FOLFIRI/panitumumab  04/15/2021 Cycle 13 FOLFIRI/panitumumab  05/06/2021 Cycle 14 FOLFIRI/Panitumumab  05/27/2021 Cycle 15 FOLFIRI/Panitumumab  06/17/2021 CT abdomen/pelvis 07/05/2021-slight enlargement of a dominant right omental implant, no new implants Patient requested a treatment break CT abdomen/pelvis 10/04/2021-mild increase in size of multiple omental soft tissue nodules, 4 mm calculus in the distal left ureter adjacent to the ureteral stent Cycle 1 Lonsurf  10/14/2021 10/28/2021 Avastin  every 2 weeks Cycle 2 Lonsurf  11/11/2021 Cycle 3 Lonsurf  12/09/2021 Cycle 4 Lonsurf  01/06/2022 Avastin  held 01/20/2022 due to proteinuria, 24-hour urine 43 mg CT/pelvis 01/30/2022-possible new peritoneal implant at the transverse colon, no significant change in other omental/peritoneal implants, stable chronic rectal wall thickening, status post removal of double-J left ureteral stent with no evidence of hydronephrosis or ureteral calculus, nonobstructing bilateral renal calculi Cycle 5 Lonsurf  02/05/2022 or 02/06/2022 Avastin  every 2 weeks Cycle 6 Lonsurf  03/03/2022 CTs 04/05/2022-increase in size and number of peritoneal implants  compared to January 2024 central liver mass narrowing the anterior branch of the right portal vein, decreased biliary duct dilation, biliary stents in place, stable right cardiophrenic lymph nodes, separate pigtail stent in the lumen of the proximal duodenum Cycle 1 FOLFOX 04/14/2022 CT abdomen/pelvis 04/16/2022-stent within the third portion of the duodenum, stable peritoneal nodules, indwelling biliary stents with mild left biliary duct dilation Cycle 2 FOLFOX 04/28/2022, Emend and prophylactic dexamethasone  added Cycle 3 FOLFOX 05/12/2022, Aloxi , Emend, prophylactic dexamethasone , Compazine  and lorazepam  as needed Chemotherapy held 05/25/2020 due to severe hypertension, evaluated in the emergency department Cycle 4 FOLFOX 06/03/2022 Cycle 5 FOLFOX 06/16/2022,  oxaliplatin  dose reduced and 5-FU bolus eliminated due to neutropenia CT 06/25/2022-unchanged soft tissue at the hepatic hilum, unchanged, bile duct stent, decreased size of peritoneal and omental nodules and diminished volume of likely fluid in the lower left pelvis Cycle 6 FOLFOX 06/30/2022 Cycle 7 FOLFOX 07/21/2022 CT 07/24/2022 (in the emergency department with nausea, headache, abdominal pain)-no acute pathology.  No significant interval change in omental nodularity. Cycle 8 FOLFOX 08/04/2022, pruritus on the palms at the end of the oxaliplatin  infusion, Pepcid  and Benadryl  given, symptoms resolved, infusion not resumed Cycle 9 FOLFOX 08/18/2022-Pepcid  and Benadryl  added to premedications, Oxaliplatin  diluted in a larger volume and infusion time increased Cycle 10 FOLFOX 09/08/2022 Cycle 11 FOLFOX 09/22/2022 10/06/2022 patient declined treatment CTs 10/15/2022-similar soft tissue fullness in the hepatic hilum with biliary stents in place, decrease in omental/peritoneal metastases Maintenance capecitabine  10/26/2022 CT abdomen/pelvis 11/19/2022-mild increase in tumor studding at the left paracolic gutter and omentum, circumferential wall thickening of the  rectum, biliary stents in place with minimal increase in intrahepatic biliary dilation, nonobstructive renal calculi Colonoscopy 11/23/2022-anastomosis at 15 cm from the anal verge-patent, 2 cm polypoid tissue at the anastomosis biopsied, invasive adenocarcinoma moderately differentiated, preserved expression of major MMR proteins CTs 12/15/2022: Possible developing nodule in the lateral left liver, increased omental nodularity, persistent wall thickening in the upper rectum CT abdomen/pelvis 12/24/2022: Increased thickened fold in the transverse colon-infectious/inflammatory, 2.1 cm lateral left liver lesion, potential new 5 mm segment 5 lesion, stable biliary stents with intrahepatic biliary dilation, central biliary wall thickening near the hepatic hilum, multiple peritoneal/omental implants unchanged from 12/15/2022, but likely larger than 11/19/2022 Cycle 1 irinotecan /Panitumumab  01/06/2023 Cycle 2 irinotecan /Panitumumab  01/20/2023 CTs 01/24/2023-slight increase in omental nodularity. Cycle 3 irinotecan /Panitumumab  02/03/2023 Cycle 4 irinotecan /panitumumab  02/17/2023 CTs abdomen/pelvis 02/24/2023-subtly hypoattenuating lesion within the peripheral aspect of hepatic segment 2 redemonstrated.  Additional scattered subcentimeter low-density lesions within the liver too small to characterize.  Prominent intrahepatic biliary dilatation with biliary stents in place.  Similar degree of nodularity along the omentum and left paracolic gutter.  No ascites. CTs 03/09/2023-indwelling endoscopically placed stents; level of intrahepatic biliary duct dilatation is slightly improved on the left and some on the right.  Gallbladder is dilated with wall edema which is increasing.  More nodular tissue extending up along the porta hepatis.  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  Rectum with nodular wall thickening with extension into the adjacent tissue, nodular areas seen extending perirectal and presacral spaces.   Peritoneal carcinomatosis again identified. Irinotecan /Panitumumab  04/02/2023 Irinotecan /panitumumab  04/16/2023 Irinotecan /Panitumumab  05/07/2023 Irinotecan /Panitumumab  06/19/2023 CT abdomen/pelvis 06/21/2023-stable heterogeneous enhancement throughout the liver with nodularity along the surface of the liver.  Small amount of perihepatic ascites.  Stable nodularity right abdomen.  Circumferential rectal wall thickening/mass unchanged. Irinotecan /Panitumumab  discontinued 07/10/2023 due to lack of clinical benefit/poor tolerance Appointment with Dr. Sheilda 07/23/2023 CTs 08/02/2023-mid rectal soft tissue mass, mildly progressive.  Right hepatic metastases, progressive.  Peritoneal disease/omental caking stable versus mildly progressive.  Small volume abdominopelvic ascites mildly progressive. Cycle 1 FOLFOX 08/05/2023 Cycle 2 FOLFOX 08/20/2023 (Pepcid  and Benadryl  daily x 2 days before treatment, Pepcid  continued for 2 days post oxaliplatin ; oxaliplatin  administration time increased)     2. Hypertension G4 P3, twins Kidney stones-bilateral staghorn renal calculi on CT 08/10/2020 Infected Port-A-Cath 09/12/2019-placed on Augmentin , referred for Port-A-Cath removal; Port-A-Cath removed 09/14/2019; PICC line placed 09/14/2019; culture staph aureus.  Course of Septra  completed. Neutropenia secondary to chemotherapy-Udenyca  added with cycle 8 FOLFIRI Right nephrostomy tube 09/12/2020; stent placed 10/09/2020, percutaneous nephrostolithotomy treatment of right-sided  kidney stones 10/09/2020, malpositioned right ureter stent replacement 10/23/2020, right ureter stent removed 10/29/2020 Percutaneous left nephrostolithotomy 10/01/2021-no renal stone identified, left ureter stent left in place Cystoscopy 10/14/2021-stent removed 8.  Admission 03/21/2022 with new onset jaundice, abdominal pain, nausea MRI abdomen 03/22/2022-central liver mass with obstruction at the confluence of the intrahepatic ducts and proximal common hepatic duct  with severe Intermatic biliary ductal dilatation, progressive peritoneal carcinomatosis ERCP 03/24/2022-severe biliary stricture in the hepatic duct affecting the left and right hepatic duct, common hepatic duct, and bifurcation, malignant appearing, temporary plastic pancreatic stent, left and right hepatic duct stents were placed ERCP 02/25/2018 25-2 visibly patent stents from the biliary tree were seen in the major papilla, removed.  2 moderate biliary strictures found in the hepatic duct system, malignant appearing.  Left and right hepatic duct and all intrahepatic branches were moderately dilated secondary to the strictures.  The strictures were dilated.  Plastic stent placed into the left hepatic duct and right hepatic duct. 9.  Admission 04/16/2022 with nausea/vomiting and abdominal pain, felt to be acute toxicity related to chemotherapy, discharged home 04/18/2022 10.  Admission 11/19/2022 with nausea/vomiting and abdominal pain-improved 11.  Admission 12/15/2022 with fever and right submandibular swelling/tenderness CT neck 12/15/2022: 2 mm distal right submandibular duct calculus with mild inflammatory change adjacent to the right submandibular gland 12.  Admission 12/24/2022 with a fever nausea/vomiting, diarrhea, Klebsiella bacteremia completed course of Augmentin  13.  Admission with abdominal pain and nausea/vomiting 03/09/2023 through 03/14/2023-concern for gallbladder inflammation.  Not felt to be a surgical candidate.  Percutaneous cholecystostomy tube placed 03/11/2023.  Tube removed 04/24/2023. 14.  Admission 06/20/2023 with abdominal pain and constipation, pain improved partially following a bowel movement 15.  Admission 08/02/2023 with abdominal pain, cycle 1 salvage FOLFOX 08/05/2023 16.  Admission 09/20/2023 with nausea and abdominal pain  Ms. Barrette appears unchanged.  She continues to require frequent oral and IV Dilaudid  for breakthrough pain.     She notes persistent severe hypertension  despite the current antihypertensive regimen.  The CEA is higher.  I discussed the CEA result with her this morning.  I am concerned the cancer may be progressing based on the CEA, her pain, and CT findings.  The plan is to complete 2-3 more cycles of FOLFOX prior to a restaging CT.  We will follow the CEA closely.  Recommendations: Adjust hypertension regimen per the medical service Continue Duragesic /Dilaudid  per the palliative care service Bowel regimen Please call oncology as needed, outpatient follow-up is scheduled at the Cancer center 09/30/2023   LOS: 4 days   Arley Hof, MD   09/25/2023, 7:57 AM

## 2023-09-25 NOTE — Telephone Encounter (Signed)
 Pharmacy Patient Advocate Encounter  Received notification from [facility] that Prior Authorization for fentaNYL  75MCG/HR 72 hr patches  has been APPROVED from 09/25/2023 to 01/13/2024   PA #/Case ID/Reference #: [id number]

## 2023-09-25 NOTE — Progress Notes (Signed)
 Daily Progress Note   Patient Name: April [name]       Date: 09/25/2023 DOB: Jan 20, 1975  Age: 48 y.o. MRN#: [id number] Attending Physician: Jadine Toribio SQUIBB, MD Primary Care Physician: Oris Camie BRAVO, NP Admit Date: 09/20/2023 Length of Stay: 4 days  Reason for Consultation/Follow-up: Pain control  Subjective:   CC: Patient notes pain controlled today.  Following up regarding symptom management.  Subjective:  Reviewed EMR including recent documentation from hospitalist and oncologist.  Patient to follow-up with outpatient oncology to possibly complete 2-3 more cycles of FOLFOX prior to restaging CT.  Is noted patient's CEA was higher and this is concerning for cancer progression. At time of EMR review on past 24 hours patient has received as needed IV Dilaudid  1 mg x 4 doses and as needed p.o. Dilaudid  8 mg x 5 doses. Patient's last BM noted today.  Presented to bedside to see patient.  Patient sitting up comfortably in bed.  Discussed patient's pain regimen at this time.  Patient feels her pain is better controlled with her current opioid regimen.  Patient would like to go home today if possible.  She feels she can manage her pain on the current fentanyl  patch dose of 150 mcg/h every 72 hour along with the as needed oral tablet Dilaudid  8 mg every 4 hour. Also noted importance of regular bowel movements to prevent pain worsened by constipation.  Discussed use of senna and lactulose  for this.  All questions answered at that time.  Patient to follow-up with outpatient PMT provider at [facility].  Discussed care coordination with hospitalist, RN, pharmacy, and outpatient PMT providers to coordinate planning for discharge today if pain medications can be obtained.  Objective:   Vital Signs:  BP (!) 170/126   Pulse 78   Temp 98.3 F (36.8 C) (Oral)   Resp 17   Ht 5' 4 (1.626 m)   Wt 54.3 kg   LMP 01/07/2006   SpO2 100%   BMI 20.56 kg/m   Physical Exam: General: NAD,  alert, frail, cachectic Cardiovascular: RRR Respiratory: no increased work of breathing noted, not in respiratory distress Abdomen: distended Neuro: A&Ox4, following commands easily Psych: appropriately answers all questions   Assessment & Plan:   Assessment: Patient is a 48 year old female with past medical history of stage IV colorectal cancer with metastatic disease to liver and peritoneum complicated by biliary obstruction status post biliary stenting currently on chemotherapy, hypertension, COPD, aortic aneurysm, and prolonged QTc who was admitted 09/20/2023 for management of worsening abdominal pain, nausea, and vomiting.  Imaging obtained demonstrated new mild left hydroureteronephrosis without clear etiology and suspected increase in metastatic disease burden in liver.  Palliative medicine team consulted to assist with pain management. Of note, patient well-known to palliative medicine team from prior hospitalizations and follows with outpatient PMT at [facility].   Recommendations/Plan: # Complex medical decision making/goals of care:    - Patient has continued to express that she wants a pain controlled while wanting to live.  Patient continues to take cancer directed therapies that are offered.  Oncology following planning for follow-up in outpatient setting regarding further cancer directed therapies.                -  Code Status: Full Code    # Symptom management Patient is receiving these palliative interventions for symptom management with an intent to improve quality of life.                 -  Pain, severe acute on chronic pain in setting of stage IV colorectal cancer                               - Continue fentanyl  patch to 150 mcg/h every 72 hours (Is ordered as 75mcg/hr patch x 2; discussed with pharmacy)    - Continue oral dilaudid  8mg  tab q 4 hours as needed    - Continue IV dilaudid  1mg  q 4 hours as needed for breakthrough pain only after oral dilaudid                                  - Continue oral dexamethasone  4 mg daily - Outpatient PMT provider has been assisting with prior authorizations for pain medications.  Insurance will only cover patient taking oral Dilaudid  6 times a day.  Patient is not scheduled for pain pump placement until October; patient on waiting list for cancellation.    - Planning for possible pain pump placement in October.                  - Constipation, acute on chronic Patient noted to have BM on 09/22/2023.                               - Continue lactulose  30 mg daily.  Discussed with patient she can refuse if has had a bowel movement.                                       - Continue senna 3 tab twice daily   # Psycho-social/Spiritual Support:  - Support System: Mother- Parthenia  # Discharge Planning: Home with Palliative Services follow up at [facility]  Discussed with: Patient, RN, pharmacist, outpatient PMT, hospitalist   Thank you for allowing the palliative care team to participate in the care [facility] [place].  Tinnie Radar, DO Palliative Care Provider PMT # [phone]  If patient remains symptomatic despite maximum doses, please call PMT at [phone] between 0700 and 1900. Outside of these hours, please call attending, as PMT does not have night coverage.

## 2023-09-25 NOTE — Discharge Summary (Signed)
 Physician Discharge Summary   Patient: April [name] MRN: [id number] DOB: 04-23-1975  Admit date:     09/20/2023  Discharge date: 09/25/23  Discharge Physician: Toribio Door   PCP: Oris Camie BRAVO, NP   Recommendations at discharge:   Follow-up with oncology and outpatient palliative.  Discharge Diagnoses: Principal Problem:   Intractable nausea and vomiting Active Problems:   Malignant neoplasm of rectosigmoid (colon) (HCC)   Hypertensive urgency   Hypokalemia   Hyponatremia   Palliative care encounter  Resolved Problems:   * No resolved hospital problems. *  Hospital Course: 48 year old woman PMH metastatic colon cancer, admitted for uncontrolled pain secondary to malignancy, intractable nausea and vomiting.  Seen by palliative care, medications adjusted, did require PCA for a time.  Subsequently pain control improved, was able to tolerate diet and was discharged home in stable condition.  Noted to have high blood pressure related to pain.  Consultants Palliative Care Medicine Oncology   Procedures/Events 9/10 Technically successful fluoroscopically-guided L4-L5 lumbar puncture with intrathecal morphine  injection.   Intractable pain secondary to malignancy Sigmoid colon cancer stage IV  Nausea and vomiting Continue pain control as per palliative medicine Fentanyl  patch to 150 mcg/h every 72 hours (Is ordered as 75mcg/hr patch x 2; discussed with pharmacy) Continue oral dilaudid  8mg  tab q 4 hours as needed Continue oral dexamethasone  4 mg daily Outpatient PMT provider has been assisting with prior authorizations for pain medications.  Insurance will only cover patient taking oral Dilaudid  6 times a day.  Patient is not scheduled for pain pump placement until October; patient on waiting list for cancellation. Outpatient follow-up is scheduled at the Cancer [facility] 09/30/2023    Normocytic anemia Stable   Hypertensive urgency on admission Secondary to  inability to take oral antihypertensives relates nausea and vomiting prior to admission.   Interval development of mild left hydronephrosis and left hydroureter, without clear cause for obstruction identified. The obstruction could be related to extrinsic mass effect upon the distal left ureter by the cystic structure described above. Renal function is stable, can follow-up as an outpatient if needed.     Pain control - [place]  Controlled Substance Reporting System database was reviewed.   Disposition: Home Diet recommendation:  Regular diet DISCHARGE MEDICATION: Allergies as of 09/25/2023       Reactions   Lisinopril Swelling, Other (See Comments)   Lip swelling and Angioedema   Irinotecan  Other (See Comments)   Stomach cramping Patient given 0.5 mL atropine  IV and was able to continue infusion.  See Progress note on 10/03/20.    Leucovorin  Calcium  Itching, Other (See Comments)   Patient reported itchy palms towards end of transfusion, see progress note 08/04/22.   Oxaliplatin  Itching, Other (See Comments)   Patient reported itchy palms towards end of transfusion, see progress note on 08/04/22.        Medication List     STOP taking these medications    Antacid Regular Strength 200-200-20 MG/5ML suspension Generic drug: alum & mag hydroxide-simeth   docusate sodium  100 MG capsule Commonly known as: COLACE   fentaNYL  50 MCG/HR Commonly known as: DURAGESIC  Replaced by: fentaNYL  75 MCG/HR   Normal Saline Flush 0.9 % Soln   potassium chloride  10 MEQ CR capsule Commonly known as: MICRO-K    prochlorperazine  10 MG tablet Commonly known as: COMPAZINE    senna 8.6 MG Tabs tablet Commonly known as: SENOKOT       TAKE these medications    acetaminophen  500 MG tablet Commonly known  as: TYLENOL  Take 500 mg by mouth in the morning, at noon, and at bedtime. What changed:  how much to take when to take this reasons to take this   amLODipine  10 MG  tablet Commonly known as: NORVASC  Take 1 tablet (10 mg total) by mouth daily.   cloNIDine  0.1 MG tablet Commonly known as: CATAPRES  Take 3 tablets (0.3 mg total) by mouth 3 (three) times daily.   dexamethasone  4 MG tablet Commonly known as: DECADRON  Take 1 tablet (4 mg total) by mouth daily. Start taking on: September 26, 2023 What changed:  medication strength how much to take   dicyclomine  20 MG tablet Commonly known as: BENTYL  Take 1 tablet (20 mg total) by mouth 3 (three) times daily as needed for spasms (rectal/AB pain).   famotidine  20 MG tablet Commonly known as: PEPCID  Take 1 tablet (20 mg total) once daily x 2 days prior to oxaliplatin  and daily x 2 days after oxaliplatin  dose. Also take Benadryl  25 mg (OTC) daily x 2 days prior to oxaliplatin .   fentaNYL  75 MCG/HR Commonly known as: DURAGESIC  Place 2 patches onto the skin every 3 days. Start taking on: September 27, 2023 Replaces: fentaNYL  50 MCG/HR   HYDROmorphone  8 MG tablet Commonly known as: DILAUDID  Take 1 tablet (8 mg total) by mouth every 4 (four) hours as needed for severe pain (pain score 7-10) or moderate pain (pain score 4-6).   lactulose  10 GM/15ML solution Commonly known as: CHRONULAC  Take 45 mLs (30 g total) by mouth daily. Start taking on: September 26, 2023 What changed:  how much to take when to take this reasons to take this   metoprolol  tartrate 100 MG tablet Commonly known as: LOPRESSOR  Take 1 tablet (100 mg total) by mouth 2 (two) times daily.   polyethylene glycol powder 17 GM/SCOOP powder Commonly known as: GLYCOLAX /MIRALAX  Take 17 g by mouth daily as needed. What changed: reasons to take this   senna-docusate 8.6-50 MG tablet Commonly known as: Senokot-S Take 3 tablets by mouth 2 (two) times daily.        Follow-up Information     Cloretta Arley NOVAK, MD Follow up.   Specialty: Oncology Why: Office will contact you with an appointment Contact information: [address] [place] [address] [phone]         Pickenpack-Cousar, Fannie SAILOR, NP Follow up.   Specialty: Hospice and Palliative Medicine Why: Office will contact you with an appointment Contact information: [address] [place] [address] [place] [address] [phone]                Discharge Exam: April [name]   09/20/23 1650  Weight: 54.3 kg   Physical Exam Vitals reviewed.  Constitutional:      General: She is not in acute distress.    Appearance: She is not ill-appearing or toxic-appearing.  Cardiovascular:     Rate and Rhythm: Normal rate and regular rhythm.     Heart sounds: No murmur heard. Pulmonary:     Effort: Pulmonary effort is normal. No respiratory distress.     Breath sounds: No wheezing, rhonchi or rales.  Neurological:     Mental Status: She is alert.  Psychiatric:        Mood and Affect: Mood normal.        Behavior: Behavior normal.      Condition at discharge: good  The results of significant diagnostics from this hospitalization (including imaging, microbiology, ancillary and laboratory) are listed below for  reference.   Imaging Studies: DG Fluoro Rm 1-60 Min - No Report Result Date: 09/23/2023 Fluoroscopy was utilized by the requesting physician.  No radiographic interpretation.   DG FL GUIDED LUMBAR PUNCTURE Result Date: 09/23/2023 CLINICAL DATA:  Provided history: Abdominal pain. Additional history: Request received for fluoroscopically-guided lumbar puncture with intrathecal morphine  injection (assessment prior to intrathecal pain pump placement). EXAM: DIAGNOSTIC LUMBAR PUNCTURE UNDER FLUOROSCOPIC GUIDANCE COMPARISON:  CT abdomen/pelvis 09/20/2023. FLUOROSCOPY: Radiation Exposure Index (as provided by the fluoroscopic device): 19.30 mGy Kerma PROCEDURE: Informed consent was obtained from the patient prior to the procedure. This process included a discussion of procedure risk. Time-out was performed. The patient was positioned prone on the  fluoroscopy table. An appropriate skin entry site was determined under fluoroscopy and marked. The operator donned sterile gloves and a mask. The lower back was prepped and draped in the usual sterile fashion. Local anesthesia was provided with 1% lidocaine . Under intermittent fluoroscopy, lumbar puncture was performed at the L4-L5 level using a 20 gauge spinal needle with return of clear/colorless CSF. An image was saved and sent to PACS. Subsequently, 0.5 mg (1 mL) of morphine  sulfate was injected into the thecal sac. The needle was then removed from the patient, and a dressing was applied at the skin entry site. The patient tolerated the procedure well and no immediate post-procedure complication was apparent. IMPRESSION: 1. Technically successful fluoroscopically-guided L4-L5 lumbar puncture with intrathecal morphine  injection, as described. 2. No immediate post-procedure complication. Electronically Signed   By: Rockey Childs D.O.   On: 09/23/2023 15:30   CT ABDOMEN PELVIS W CONTRAST Result Date: 09/20/2023 CLINICAL DATA:  Abdominal pain, history of metastatic colon cancer, nausea and vomiting EXAM: CT ABDOMEN AND PELVIS WITH CONTRAST TECHNIQUE: Multidetector CT imaging of the abdomen and pelvis was performed using the standard protocol following bolus administration of intravenous contrast. RADIATION DOSE REDUCTION: This exam was performed according to the departmental dose-optimization program which includes automated exposure control, adjustment of the mA and/or kV according to patient size and/or use of iterative reconstruction technique. CONTRAST:  OMNIPAQUE  IOHEXOL  300 MG/ML  SOLN COMPARISON:  08/02/2023 FINDINGS: Lower chest: No acute pleural or parenchymal lung disease. Hepatobiliary: Marked heterogeneity throughout the right lobe liver compatible with known diffuse hepatic metastases. The index lesion within the right lobe liver measured previously is more difficult to measure discretely on this  exam, with subjective increase in the number of hypodense lesions consistent with progressive metastases. Persistent indwelling biliary duct stents, with no change in the intrahepatic biliary duct dilation seen on prior study. Pancreas: Unremarkable. No pancreatic ductal dilatation or surrounding inflammatory changes. Spleen: Normal in size without focal abnormality. Adrenals/Urinary Tract: Stable bilateral nonobstructing renal calculi measuring up to 13 mm on the left and 9 mm on the right. Interval development of mild left hydronephrosis and hydroureter, without clear cause for obstruction identified. The adrenals are unremarkable. Bladder is moderately distended without filling defect. Stomach/Bowel: No bowel obstruction or ileus. The ill-defined soft tissue mass along the mid rectum is again identified, difficult to clearly visualize due to loss of fat plane between the mass and the posterior wall the uterus, measuring up to 5.4 x 3.0 cm reference image 60/2. No evidence of acute bowel inflammation. Vascular/Lymphatic: No significant vascular findings. No discrete adenopathy. Reproductive: Uterus is atrophic. There is a 6.8 x 5.7 cm fluid collection within the region of the left adnexa, unchanged since prior exam. It is unclear whether this reflects loculated fluid, peritoneal metastasis, or a left  adnexal cyst. Other: Small volume abdominal ascites. Omental caking is again noted, most pronounced in the right upper quadrant, not appreciably changed. No free intraperitoneal gas. No abdominal wall hernia. Musculoskeletal: No acute or destructive bony abnormalities. Reconstructed images demonstrate no additional findings. IMPRESSION: 1. Grossly stable soft tissue mass within the mid rectum, difficult to measure accurately given loss of normal fat plane between the mass and the posterior wall the uterus, compatible with known history of malignancy. 2. Stable cystic structure within the left lower pelvis, either  loculated fluid, cystic peritoneal metastases, or left adnexal cyst. 3. Interval development of mild left hydronephrosis and left hydroureter, without clear cause for obstruction identified. The obstruction could be related to extrinsic mass effect upon the distal left ureter by the cystic structure described above. 4. Stable peritoneal metastases and omental caking, most pronounced in the right upper quadrant. 5. Diffuse metastatic disease throughout the right lobe liver, subjectively worse since prior exam. 6. Small volume ascites. 7. Bilateral nonobstructing renal calculi. Electronically Signed   By: Ozell Daring M.D.   On: 09/20/2023 17:30   DG Chest Port 1 View Result Date: 09/20/2023 CLINICAL DATA:  Generalized abdominal pain, nausea and vomiting, history of metastatic colon cancer EXAM: PORTABLE CHEST 1 VIEW COMPARISON:  06/20/2023 FINDINGS: Single frontal view of the chest demonstrates an unremarkable cardiac silhouette. No airspace disease, effusion, or pneumothorax. Left-sided PICC tip overlies superior vena cava. No acute bony abnormalities. IMPRESSION: 1. No acute intrathoracic process. Electronically Signed   By: Ozell Daring M.D.   On: 09/20/2023 16:24    Microbiology: Results for orders placed or performed during the hospital encounter of 09/20/23  Resp panel by RT-PCR (RSV, Flu A&B, Covid)     Status: None   Collection Time: 09/20/23  3:24 PM   Specimen: Nasal Swab  Result Value Ref Range Status   SARS Coronavirus 2 by RT PCR NEGATIVE NEGATIVE Final    Comment: (NOTE) SARS-CoV-2 target nucleic acids are NOT DETECTED.  The SARS-CoV-2 RNA is generally detectable in upper respiratory specimens during the acute phase of infection. The lowest concentration of SARS-CoV-2 viral copies this assay can detect is 138 copies/mL. A negative result does not preclude SARS-Cov-2 infection and should not be used as the sole basis for treatment or other patient management decisions. A negative  result may occur with  improper specimen collection/handling, submission of specimen other than nasopharyngeal swab, presence of viral mutation(s) within the areas targeted by this assay, and inadequate number of viral copies(<138 copies/mL). A negative result must be combined with clinical observations, patient history, and epidemiological information. The expected result is Negative.  Fact Sheet for Patients:  BloggerCourse.com  Fact Sheet for Healthcare Providers:  SeriousBroker.it  This test is no t yet approved or cleared by the [place]  FDA and  has been authorized for detection and/or diagnosis of SARS-CoV-2 by FDA under an Emergency Use Authorization (EUA). This EUA will remain  in effect (meaning this test can be used) for the duration of the COVID-19 declaration under Section 564(b)(1) of the Act, 21 U.S.C.section 360bbb-3(b)(1), unless the authorization is terminated  or revoked sooner.       Influenza A by PCR NEGATIVE NEGATIVE Final   Influenza B by PCR NEGATIVE NEGATIVE Final    Comment: (NOTE) The Xpert Xpress SARS-CoV-2/FLU/RSV plus assay is intended as an aid in the diagnosis of influenza from Nasopharyngeal swab specimens and should not be used as a sole basis for treatment. Nasal washings and aspirates are  unacceptable for Xpert Xpress SARS-CoV-2/FLU/RSV testing.  Fact Sheet for Patients: BloggerCourse.com  Fact Sheet for Healthcare Providers: SeriousBroker.it  This test is not yet approved or cleared by the [place]  FDA and has been authorized for detection and/or diagnosis of SARS-CoV-2 by FDA under an Emergency Use Authorization (EUA). This EUA will remain in effect (meaning this test can be used) for the duration of the COVID-19 declaration under Section 564(b)(1) of the Act, 21 U.S.C. section 360bbb-3(b)(1), unless the authorization is  terminated or revoked.     Resp Syncytial Virus by PCR NEGATIVE NEGATIVE Final    Comment: (NOTE) Fact Sheet for Patients: BloggerCourse.com  Fact Sheet for Healthcare Providers: SeriousBroker.it  This test is not yet approved or cleared by the [place]  FDA and has been authorized for detection and/or diagnosis of SARS-CoV-2 by FDA under an Emergency Use Authorization (EUA). This EUA will remain in effect (meaning this test can be used) for the duration of the COVID-19 declaration under Section 564(b)(1) of the Act, 21 U.S.C. section 360bbb-3(b)(1), unless the authorization is terminated or revoked.  Performed at [facility], [address], [place], [place] [address]   Blood Culture (routine x 2)     Status: None   Collection Time: 09/20/23  3:30 PM   Specimen: BLOOD LEFT HAND  Result Value Ref Range Status   Specimen Description   Final    BLOOD LEFT HAND Performed at [facility], [address], [place], [place] [address]    Special Requests   Final    BOTTLES DRAWN AEROBIC AND ANAEROBIC Blood Culture adequate volume Performed at [facility], [address], [place], [place] [address]    Culture   Final    NO GROWTH 5 DAYS Performed at [facility], [address] [place], [place] [address]    Report Status 09/25/2023 FINAL  Final  Blood Culture (routine x 2)     Status: Abnormal (Preliminary result)   Collection Time: 09/20/23  9:53 PM   Specimen: BLOOD RIGHT ARM  Result Value Ref Range Status   Specimen Description   Final    BLOOD RIGHT ARM Performed at [facility], [address], [place], [place] [address]    Special Requests   Final    BOTTLES DRAWN AEROBIC AND ANAEROBIC Blood Culture results may not be optimal due to an inadequate volume of blood received in culture bottles Performed at [facility], [address], [place], [place] [address]    Culture  Setup Time   Final    GRAM POSITIVE COCCI IN CLUSTERS ANAEROBIC BOTTLE ONLY CRITICAL RESULT CALLED TO, READ BACK BY AND VERIFIED WITH: PHARMD MELISSA JAMES ON 09/21/23 @ 1514 BY DRT GRAM POSITIVE RODS AEROBIC BOTTLE ONLY CRITICAL RESULT CALLED TO, READ BACK BY AND VERIFIED WITH: PHARMD L EANNE P [id number] FCP    Culture (A)  Final    STAPHYLOCOCCUS HOMINIS THE SIGNIFICANCE OF ISOLATING THIS ORGANISM FROM A SINGLE SET OF BLOOD CULTURES WHEN MULTIPLE SETS ARE DRAWN IS UNCERTAIN. PLEASE NOTIFY THE MICROBIOLOGY DEPARTMENT WITHIN ONE WEEK IF SPECIATION AND SENSITIVITIES ARE REQUIRED. CULTURE REINCUBATED FOR BETTER GROWTH Performed at [facility], [address] [place], [place] [address]    Report Status PENDING  Incomplete  Blood Culture ID Panel (Reflexed)     Status: Abnormal   Collection Time: 09/20/23  9:53 PM  Result Value Ref Range Status   Enterococcus faecalis  NOT DETECTED NOT DETECTED Final   Enterococcus Faecium NOT DETECTED NOT DETECTED Final   Listeria monocytogenes NOT DETECTED NOT DETECTED Final   Staphylococcus species DETECTED (A) NOT DETECTED Final    Comment: CRITICAL RESULT CALLED TO, READ BACK BY AND VERIFIED WITH: PHARMD MELISSA JAMES ON 09/21/23 @ 1514 BY DRT    Staphylococcus aureus (BCID) NOT DETECTED NOT DETECTED Final   Staphylococcus epidermidis NOT DETECTED NOT DETECTED Final   Staphylococcus lugdunensis NOT DETECTED NOT DETECTED Final   Streptococcus species NOT DETECTED NOT DETECTED Final   Streptococcus agalactiae NOT DETECTED NOT DETECTED Final   Streptococcus pneumoniae NOT DETECTED NOT DETECTED Final   Streptococcus pyogenes NOT DETECTED NOT DETECTED Final   A.calcoaceticus-baumannii NOT DETECTED NOT DETECTED Final   Bacteroides fragilis NOT DETECTED NOT DETECTED Final   Enterobacterales NOT DETECTED NOT DETECTED Final   Enterobacter cloacae complex NOT DETECTED NOT DETECTED Final   Escherichia  coli NOT DETECTED NOT DETECTED Final   Klebsiella aerogenes NOT DETECTED NOT DETECTED Final   Klebsiella oxytoca NOT DETECTED NOT DETECTED Final   Klebsiella pneumoniae NOT DETECTED NOT DETECTED Final   Proteus species NOT DETECTED NOT DETECTED Final   Salmonella species NOT DETECTED NOT DETECTED Final   Serratia marcescens NOT DETECTED NOT DETECTED Final   Haemophilus influenzae NOT DETECTED NOT DETECTED Final   Neisseria meningitidis NOT DETECTED NOT DETECTED Final   Pseudomonas aeruginosa NOT DETECTED NOT DETECTED Final   Stenotrophomonas maltophilia NOT DETECTED NOT DETECTED Final   Candida albicans NOT DETECTED NOT DETECTED Final   Candida auris NOT DETECTED NOT DETECTED Final   Candida glabrata NOT DETECTED NOT DETECTED Final   Candida krusei NOT DETECTED NOT DETECTED Final   Candida parapsilosis NOT DETECTED NOT DETECTED Final   Candida tropicalis NOT DETECTED NOT DETECTED Final   Cryptococcus neoformans/gattii NOT DETECTED NOT DETECTED Final    Comment: Performed at [facility], [address], [place], [place] [address]  Culture, blood (Routine X 2) w Reflex to ID Panel     Status: None (Preliminary result)   Collection Time: 09/22/23  2:14 PM   Specimen: BLOOD RIGHT HAND  Result Value Ref Range Status   Specimen Description   Final    BLOOD RIGHT HAND Performed at [facility], [address] [place], [place] [address]    Special Requests   Final    BOTTLES DRAWN AEROBIC AND ANAEROBIC Blood Culture results may not be optimal due to an inadequate volume of blood received in culture bottles Performed at [facility], [address], [place], [place] [address]    Culture   Final    NO GROWTH 3 DAYS Performed at [facility], [address], [place], [place] [address]    Report Status PENDING  Incomplete  Culture, blood (Routine X 2) w Reflex to ID Panel     Status: None (Preliminary result)   Collection Time: 09/22/23  2:17 PM    Specimen: BLOOD LEFT HAND  Result Value Ref Range Status   Specimen Description   Final    BLOOD LEFT HAND Performed at [facility], [address], [place], [place] [address]    Special Requests   Final    BOTTLES DRAWN AEROBIC AND ANAEROBIC Blood Culture adequate volume Performed at [facility], [address] [place], [place] [address]    Culture   Final    NO GROWTH 3 DAYS Performed at [facility], [address], [place], [place] [address]    Report Status PENDING  Incomplete   *Note: Due to a large number of results and/or encounters for the requested time period, some results have not been displayed. A complete set of results can be found in Results Review.    Labs: CBC: Recent Labs  Lab 09/20/23 1524 09/21/23 0612 09/22/23 0352 09/23/23 0333  WBC 12.3* 9.4 11.0* 10.2  NEUTROABS 9.6*  --   --   --   HGB 10.6* 9.2* 10.2* 10.0*  HCT 33.6* 30.1* 33.7* 33.0*  MCV 86.2 88.3 89.9 88.5  PLT 365 293 298 314   Basic Metabolic Panel: Recent Labs  Lab 09/20/23 1524 09/21/23 0612 09/21/23 1011 09/22/23 0352 09/23/23 0333  NA 131* 137 135 134* 130*  K 2.7* >7.5* 3.3* 4.1 4.2  CL 87* 105 93* 99 94*  CO2 27 25 31 25 26   GLUCOSE 73 80 119* 85 94  BUN 5* <5* <5* <5* 6  CREATININE 0.40* 0.38* 0.51 0.37* 0.38*  CALCIUM  9.2 7.8* 8.9 9.0 9.6  MG  --  1.8  --   --   --    Liver Function Tests: Recent Labs  Lab 09/20/23 1524 09/21/23 0612  AST 50* 51*  ALT 23 17  ALKPHOS 624* 528*  BILITOT 0.9 0.8  PROT 8.9* 7.6  ALBUMIN 3.4* 2.9*   CBG: No results for input(s): GLUCAP in the last 168 hours.  Discharge time spent: greater than 30 minutes.  Signed: Toribio Door, MD [facility] 09/25/2023

## 2023-09-25 NOTE — Plan of Care (Signed)
  Problem: Education: Goal: Knowledge of General Education information will improve Description: Including pain rating scale, medication(s)/side effects and non-pharmacologic comfort measures Outcome: Progressing   Problem: Clinical Measurements: Goal: Respiratory complications will improve Outcome: Progressing Goal: Cardiovascular complication will be avoided Outcome: Progressing   Problem: Coping: Goal: Level of anxiety will decrease Outcome: Progressing   Problem: Pain Managment: Goal: General experience of comfort will improve and/or be controlled Outcome: Progressing   Problem: Skin Integrity: Goal: Risk for impaired skin integrity will decrease Outcome: Progressing

## 2023-09-25 NOTE — Progress Notes (Signed)
 Discharge meds in a secure bag delivered to pt in room by this RN

## 2023-09-25 NOTE — Telephone Encounter (Signed)
 Pharmacy Patient Advocate Encounter   Received notification from Inpatient Request that prior authorization for fentaNYL  75MCG/HR 72 hr patches  is required/requested.   Insurance verification completed.   The patient is insured through [employer] .   Per test claim: PA required; PA submitted to above mentioned insurance via Latent Key/confirmation #/EOC [id number] Status is pending

## 2023-09-25 NOTE — Progress Notes (Signed)
 Additional discharge meds in a secure bag delivered to pt in room by this RN

## 2023-09-25 NOTE — Progress Notes (Signed)
 Patient's BP still 170/126 after 10mg  IV labetalol . Notified Dr. Jadine. Will continue to monitor.

## 2023-09-26 LAB — CULTURE, BLOOD (ROUTINE X 2)

## 2023-09-27 ENCOUNTER — Other Ambulatory Visit: Payer: Self-pay | Admitting: Oncology

## 2023-09-27 LAB — CULTURE, BLOOD (ROUTINE X 2)
Culture: NO GROWTH
Culture: NO GROWTH
Special Requests: ADEQUATE

## 2023-09-28 ENCOUNTER — Inpatient Hospital Stay: Admitting: Nurse Practitioner

## 2023-09-28 ENCOUNTER — Telehealth: Payer: Self-pay | Admitting: *Deleted

## 2023-09-28 NOTE — Transitions of Care (Post Inpatient/ED Visit) (Signed)
   09/28/2023  Name: April [name] MRN: [id number] DOB: Mar 22, 1975  Today's TOC FU Call Status: Today's TOC FU Call Status:: Unsuccessful Call (1st Attempt)  Attempted to reach the patient regarding the most recent Inpatient/ED visit. Spoke with mother. She will have her mother to call back.   Follow Up Plan: Additional outreach attempts will be made to reach the patient to complete the Transitions of Care (Post Inpatient/ED visit) call.   Cathlean Headland BSN RN [facility] Health Care Management Coordinator [email or web address] Direct Dial: [phone]  Fax: [phone] Website: [email or web address]

## 2023-09-29 ENCOUNTER — Telehealth: Payer: Self-pay | Admitting: *Deleted

## 2023-09-29 NOTE — Transitions of Care (Post Inpatient/ED Visit) (Signed)
 09/29/2023  Name: April [name] MRN: [id number] DOB: 10-Apr-1975  Today's TOC FU Call Status: Today's TOC FU Call Status:: Successful TOC FU Call Completed TOC FU Call Complete Date: 09/29/23 Patient's Name and Date of Birth confirmed.  Transition Care Management Follow-up Telephone Call Date of Discharge: 09/25/23 Discharge Facility: [facility]) Type of Discharge: Inpatient Admission Primary Inpatient Discharge Diagnosis:: Intractable nausea and vomiting How have you been since you were released from the hospital?: Better Any questions or concerns?: Yes Patient Questions/Concerns:: I am still having pain and my stomach is swollen. Patient Questions/Concerns Addressed: Other: (Patient has not picked up the senokot. she will also be going to the Dr tomorrow)  Items Reviewed: Did you receive and understand the discharge instructions provided?: Yes Medications obtained,verified, and reconciled?: Yes (Medications Reviewed) Any new allergies since your discharge?: No Dietary orders reviewed?: No Do you have support at home?: Yes People in Home [RPT]: alone Name of Support/Comfort Primary Source: son  Medications Reviewed Today: Medications Reviewed Today     Reviewed by Kennieth Cathlean DEL, RN (Case Manager) on 09/29/23 at 1519  Med List Status: <None>   Medication Order Taking? Sig Documenting Provider Last Dose Status Informant  acetaminophen  (TYLENOL ) 500 MG tablet [id number] Yes Take 500 mg by mouth in the morning, at noon, and at bedtime.  Patient taking differently: Take 1,000 mg by mouth daily as needed for mild pain (pain score 1-3) or moderate pain (pain score 4-6).   [provider]  Active Self, Pharmacy Records           Med Note (CRUTHIS, CHLOE C   Mon Sep 21, 2023  7:17 AM)    amLODipine  (NORVASC ) 10 MG tablet [id number] Yes Take 1 tablet (10 mg total) by mouth daily. Sonjia Held, MD  Active Self, Pharmacy Records           Med Note  (CRUTHIS, CHLOE C   Mon Sep 21, 2023  7:17 AM)    cloNIDine  (CATAPRES ) 0.1 MG tablet [id number] Yes Take 3 tablets (0.3 mg total) by mouth 3 (three) times daily. Willette Adriana LABOR, MD  Active Self, Pharmacy Records           Med Note (CRUTHIS, CHLOE C   Mon Sep 21, 2023  9:25 AM) Pt is adamant she is still taking this medication. Dispense report does not support this claim.   dexamethasone  (DECADRON ) 4 MG tablet [id number] Yes Take 1 tablet (4 mg total) by mouth daily. Jadine Toribio SQUIBB, MD  Active   dicyclomine  (BENTYL ) 20 MG tablet [id number] Yes Take 1 tablet (20 mg total) by mouth 3 (three) times daily as needed for spasms (rectal/AB pain). Cloretta Arley NOVAK, MD  Active Self, Pharmacy Records  famotidine  (PEPCID ) 20 MG tablet [id number] Yes Take 1 tablet (20 mg total) once daily x 2 days prior to oxaliplatin  and daily x 2 days after oxaliplatin  dose. Also take Benadryl  25 mg (OTC) daily x 2 days prior to oxaliplatin . Debby Olam POUR, NP  Active Self, Pharmacy Records  fentaNYL  (DURAGESIC ) 75 MCG/HR [id number] Yes Place 2 patches onto the skin every 3 days. Jadine Toribio SQUIBB, MD  Active   HYDROmorphone  (DILAUDID ) 8 MG tablet [id number] Yes Take 1 tablet (8 mg total) by mouth every 4 (four) hours as needed for severe pain (pain score 7-10) or moderate pain (pain score 4-6). Jadine Toribio SQUIBB, MD  Active   lactulose  (CHRONULAC ) 10 GM/15ML solution [id number] Yes Take 45 mLs (30 g total) by  mouth daily. Jadine Toribio SQUIBB, MD  Active   metoprolol  tartrate (LOPRESSOR ) 100 MG tablet [id number] Yes Take 1 tablet (100 mg total) by mouth 2 (two) times daily. Willette Adriana LABOR, MD  Active Self, Pharmacy Records           Med Note (CRUTHIS, CHLOE C   Mon Sep 21, 2023  9:26 AM) Pt is adamant she is still taking this medication BID. Dispense report does not support this claim.   polyethylene glycol powder (GLYCOLAX /MIRALAX ) 17 GM/SCOOP powder [id number] Yes Take 17 g by mouth daily as needed.  Patient taking  differently: Take 17 g by mouth daily as needed for mild constipation or moderate constipation.   Willette Adriana LABOR, MD  Active Self, Pharmacy Records  senna-docusate (SENOKOT-S) 8.6-50 MG tablet [id number] Yes Take 3 tablets by mouth 2 (two) times daily. Jadine Toribio SQUIBB, MD  Active             Home Care and Equipment/Supplies: Were Home Health Services Ordered?: NA Any new equipment or medical supplies ordered?: NA  Functional Questionnaire: Do you need assistance with bathing/showering or dressing?: No Do you need assistance with meal preparation?: Yes Do you need assistance with eating?: No Do you have difficulty maintaining continence: No Do you need assistance with getting out of bed/getting out of a chair/moving?: No Do you have difficulty managing or taking your medications?: No  Follow up appointments reviewed: PCP Follow-up appointment confirmed?: Yes Date of PCP follow-up appointment?: 10/05/23 Follow-up Provider: [facility] Follow-up appointment confirmed?: Yes Date of Specialist follow-up appointment?: 09/30/23 Follow-Up Specialty Provider:: Olam Ned NP oncology Do you need transportation to your follow-up appointment?: No Do you understand care options if your condition(s) worsen?: Yes-patient verbalized understanding  SDOH Interventions Today    Flowsheet Row Most Recent Value  SDOH Interventions   Food Insecurity Interventions Intervention Not Indicated  Housing Interventions Intervention Not Indicated  Transportation Interventions Other (Comment), Contracted Vendor  Utilities Interventions Intervention Not Indicated    Goals Addressed             This Visit's Progress    VBCI Transitions of Care (TOC) Care Plan       Problems:  Recent Hospitalization for treatment of Nausea and Vomiting Diet/Nutrition/Food Resources for free Ensure and Knowledge Deficit Related to Nausea and vomiting  Goal:  Over the next 30 days,  the patient will not experience hospital readmission  Interventions:  Transitions of Care: Doctor Visits  - discussed the importance of doctor visits Referral to Longitudinal Nurse Case Manager for Ongoing follow-up Arranged PCP follow-up within 12-14 days (Care Guide Scheduled) RN sent request to [employer] for free ensure RN discussed Hospice versus Palliative care    Patient Self Care Activities:  Attend all scheduled provider appointments Call pharmacy for medication refills 3-7 days in advance of running out of medications Call provider office for new concerns or questions  Notify RN Care Manager of [facility] call rescheduling needs Participate in Transition of Care Program/Attend TOC scheduled calls Perform all self care activities independently  Take medications as prescribed    Plan:  An initial telephone outreach has been scheduled for: [id number] Next PCP appointment scheduled for: [id number] Telephone follow up appointment with care management team member scheduled for:  [id number] 2:30 [facility] Patient will follow up with provider regarding abdominal discomfort and pain control        Discussed and offered 30 day TOC program.  Patient accepted.  The patient has  been provided with contact information for the care management team and has been advised to call with any health -related questions or concerns.  The patient verbalized understanding with current plan of care.  The patient is directed to their insurance card regarding availability of benefits coverage.  Cathlean Headland BSN RN [facility] Health Care Management Coordinator [email or web address] Direct Dial: [phone]  Fax: [phone] Website: [email or web address]

## 2023-09-30 ENCOUNTER — Inpatient Hospital Stay (HOSPITAL_BASED_OUTPATIENT_CLINIC_OR_DEPARTMENT_OTHER): Admitting: Nurse Practitioner

## 2023-09-30 ENCOUNTER — Inpatient Hospital Stay

## 2023-09-30 ENCOUNTER — Encounter: Payer: Self-pay | Admitting: Nurse Practitioner

## 2023-09-30 ENCOUNTER — Other Ambulatory Visit: Payer: Self-pay | Admitting: Nurse Practitioner

## 2023-09-30 VITALS — BP 143/82 | HR 100 | Temp 97.8°F | Resp 18 | Ht 64.0 in | Wt 114.2 lb

## 2023-09-30 DIAGNOSIS — C19 Malignant neoplasm of rectosigmoid junction: Secondary | ICD-10-CM | POA: Diagnosis not present

## 2023-09-30 DIAGNOSIS — R109 Unspecified abdominal pain: Secondary | ICD-10-CM | POA: Diagnosis not present

## 2023-09-30 DIAGNOSIS — G893 Neoplasm related pain (acute) (chronic): Secondary | ICD-10-CM | POA: Diagnosis not present

## 2023-09-30 DIAGNOSIS — C787 Secondary malignant neoplasm of liver and intrahepatic bile duct: Secondary | ICD-10-CM

## 2023-09-30 LAB — CBC WITH DIFFERENTIAL (CANCER CENTER ONLY)
Abs Immature Granulocytes: 0.04 K/uL (ref 0.00–0.07)
Basophils Absolute: 0.1 K/uL (ref 0.0–0.1)
Basophils Relative: 1 %
Eosinophils Absolute: 0.1 K/uL (ref 0.0–0.5)
Eosinophils Relative: 1 %
HCT: 35.1 % — ABNORMAL LOW (ref 36.0–46.0)
Hemoglobin: 11.5 g/dL — ABNORMAL LOW (ref 12.0–15.0)
Immature Granulocytes: 0 %
Lymphocytes Relative: 26 %
Lymphs Abs: 3.1 K/uL (ref 0.7–4.0)
MCH: 27.6 pg (ref 26.0–34.0)
MCHC: 32.8 g/dL (ref 30.0–36.0)
MCV: 84.4 fL (ref 80.0–100.0)
Monocytes Absolute: 0.9 K/uL (ref 0.1–1.0)
Monocytes Relative: 7 %
Neutro Abs: 8 K/uL — ABNORMAL HIGH (ref 1.7–7.7)
Neutrophils Relative %: 65 %
Platelet Count: 410 K/uL — ABNORMAL HIGH (ref 150–400)
RBC: 4.16 MIL/uL (ref 3.87–5.11)
RDW: 16.6 % — ABNORMAL HIGH (ref 11.5–15.5)
WBC Count: 12.2 K/uL — ABNORMAL HIGH (ref 4.0–10.5)
nRBC: 0 % (ref 0.0–0.2)

## 2023-09-30 LAB — CMP (CANCER CENTER ONLY)
ALT: 39 U/L (ref 0–44)
AST: 79 U/L — ABNORMAL HIGH (ref 15–41)
Albumin: 3.7 g/dL (ref 3.5–5.0)
Alkaline Phosphatase: 732 U/L — ABNORMAL HIGH (ref 38–126)
Anion gap: 12 (ref 5–15)
BUN: 7 mg/dL (ref 6–20)
CO2: 32 mmol/L (ref 22–32)
Calcium: 9.9 mg/dL (ref 8.9–10.3)
Chloride: 89 mmol/L — ABNORMAL LOW (ref 98–111)
Creatinine: 0.42 mg/dL — ABNORMAL LOW (ref 0.44–1.00)
GFR, Estimated: >60 mL/min (ref >60)
Glucose, Bld: 108 mg/dL — ABNORMAL HIGH (ref 70–99)
Potassium: 2.8 mmol/L — ABNORMAL LOW (ref 3.5–5.1)
Sodium: 133 mmol/L — ABNORMAL LOW (ref 135–145)
Total Bilirubin: 0.7 mg/dL (ref 0.0–1.2)
Total Protein: 9.5 g/dL — ABNORMAL HIGH (ref 6.5–8.1)

## 2023-09-30 LAB — CEA (ACCESS): CEA (CHCC): 289.71 ng/mL — ABNORMAL HIGH (ref 0.00–5.00)

## 2023-09-30 LAB — MAGNESIUM: Magnesium: 2 mg/dL (ref 1.7–2.4)

## 2023-09-30 NOTE — Progress Notes (Signed)
 [facility] OFFICE PROGRESS NOTE   Diagnosis: Colorectal cancer  INTERVAL HISTORY:   April [name] returns for follow-up.  She completed cycle 2 FOLFOX 08/26/2023.  She was scheduled for cycle 3 FOLFOX 09/17/2023.  She did not show for that appointment.  She was hospitalized 09/20/2023 through 09/25/2023 with intractable nausea/vomiting and pain.  She continues a fentanyl  patch with hydromorphone  as needed.  Last bowel movement was when she was in the hospital.  She continues to have intermittent nausea/vomiting.  Abdomen continues to feel tight.  No fever or shaking chills.  Objective:  Vital signs in last 24 hours:  Blood pressure (!) 143/82, pulse 100, temperature 97.8 F (36.6 C), temperature source Temporal, resp. rate 18, height 5' 4 (1.626 m), weight 114 lb 3.2 oz (51.8 kg), last menstrual period 01/07/2006, SpO2 100%.    HEENT: No thrush or ulcers. Resp: Lungs clear bilaterally. Cardio: Regular rate and rhythm. GI: Abdomen is distended, generalized tenderness.  Firm fullness right abdomen and extending across the upper abdomen. Vascular: No leg edema. Neuro: Alert and oriented. Left upper extremity PICC without erythema.  Lab Results:  Lab Results  Component Value Date   WBC 12.2 (H) 09/30/2023   HGB 11.5 (L) 09/30/2023   HCT 35.1 (L) 09/30/2023   MCV 84.4 09/30/2023   PLT 410 (H) 09/30/2023   NEUTROABS 8.0 (H) 09/30/2023    Imaging:  No results found.  Medications: I have reviewed the patient's current medications.  Assessment/Plan: Sigmoid colon cancer, stage IV (pT4a,pN2b,M1c) Colonoscopy 03/24/2019-3 rectal polyps-hyperplastic polyps, distal colon biopsy-at least intramucosal adenocarcinoma, completely obstructing mass in the distal sigmoid colon, could not be traversed, intact mismatch repair protein expression 03/24/2019-CEA 56.1 03/29/2019 CT abdomen/pelvis-circumferential thickening involving the entire mid and distal sigmoid colon to the  level of the rectosigmoid junction, solid/cystic mass in the left ovary, bilateral nephrolithiasis Robotic assisted low anterior resection, mesenteric lymphadenectomy, bilateral salpingo-oophorectomy 04/08/2019 Pathology ([facility] review of outside pathology) metastatic adenocarcinoma involving the peritoneum overlying the round ligament, serosa of the urinary bladder, serosa of the right ureter a sacral area, pelvic peritoneum, and left ovary.  Omental biopsy with focal mucin pools with no carcinoma cells identified, right hemidiaphragm biopsy involved by metastatic adenocarcinoma, invasive adenocarcinoma the sigmoid colon, moderately differentiated, T4a, perineural and vascular invasion present, 7/22 lymph nodes, multiple tumor deposits, resection margins negative Negative for PD-L1, low probability of MSI-high, HER-2 negative, negative for BRAF, NRAS and KRAS alterations CTs 05/19/2019-no evidence of metastatic disease, findings suspicious for colitis of the transverse and ascending colon, small amount of ascites in the cul-de-sac, bilateral renal calculi Cycle 1 FOLFIRI 05/24/2019, bevacizumab  added with cycle 2 Cycle 4 FOLFIRI/bevacizumab  07/05/2019 Cycle 5 FOLFIRI 07/27/2019, bevacizumab  held secondary to hypertension Cycle 6 FOLFIRI 08/10/1999, bevacizumab  held secondary to hypertension CTs 08/30/2019-no evidence of recurrent disease, new subsolid right lower lobe nodule felt to be inflammatory, emphysema Cycle 7 FOLFIRI 09/26/2019, bevacizumab  remains on hold secondary to hypertension Cycle 8 FOLFIRI 10/17/2019, bevacizumab  held secondary to hypertension, Udenyca  added for neutropenia Cycle 9 FOLFIRI 11/07/2019, bevacizumab  held secondary to hypertension, Udenyca  Cycle 10 FOLFIRI 11/28/2019, bevacizumab  held, Udenyca  Cycle 11 FOLFIRI 12/26/2019, bevacizumab  held, Udenyca  CTs 01/25/2020-no evidence of recurrent disease, resolution of right lower lobe nodule Maintenance Xeloda  beginning 02/06/2020 CTs  04/25/2020- no evidence of metastatic disease, multiple bilateral renal calculi without hydronephrosis Maintenance Xeloda  continued CT abdomen/pelvis without contrast 08/10/2020-bilateral staghorn renal calculi, no evidence of metastatic disease; addendum 08/23/2020-small but increasing omental nodules. Cycle 1 FOLFIRI/panitumumab  09/19/2020 Cycle 2 FOLFIRI/Panitumumab   10/03/2020 Cycle 3 FOLFIRI/Panitumumab  10/17/2020 Cycle 4 FOLFIRI/panitumumab  10/31/2020 Cycle 5 FOLFIRI/Panitumumab  11/14/2020 CTs 11/26/2020-stable omental metastases compared to 10/22/2020, mildly decreased from 08/10/2020.  Left lower quadrant tiny paracolic gutter implant slightly decreased from CT 08/10/2020.  No new or progressive metastatic disease in the abdomen or pelvis. Cycle 6 FOLFIRI/Panitumumab  11/28/2020, irinotecan  dose reduced, treatment schedule adjusted to every 3 weeks going forward Cycle 7 FOLFIRI/panitumumab  12/24/2020 Cycle 8 FOLFIRI/Panitumumab  01/16/2021--treatment held due to hypertension in the infusion area. Cycle 8 FOLFIRI/panitumumab  01/21/2021 Cycle 9 FOLFIRI/panitumumab  02/11/2021 Cycle 10 FOLFIRI/Panitumumab  03/04/2021 Cycle 11 FOLFIRI/Panitumumab  03/25/2021 CT abdomen/pelvis 04/10/2021-stable right omental and left posterior paracolic gutter nodules Cycle 12 FOLFIRI/panitumumab  04/15/2021 Cycle 13 FOLFIRI/panitumumab  05/06/2021 Cycle 14 FOLFIRI/Panitumumab  05/27/2021 Cycle 15 FOLFIRI/Panitumumab  06/17/2021 CT abdomen/pelvis 07/05/2021-slight enlargement of a dominant right omental implant, no new implants Patient requested a treatment break CT abdomen/pelvis 10/04/2021-mild increase in size of multiple omental soft tissue nodules, 4 mm calculus in the distal left ureter adjacent to the ureteral stent Cycle 1 Lonsurf  10/14/2021 10/28/2021 Avastin  every 2 weeks Cycle 2 Lonsurf  11/11/2021 Cycle 3 Lonsurf  12/09/2021 Cycle 4 Lonsurf  01/06/2022 Avastin  held 01/20/2022 due to proteinuria, 24-hour urine 43 mg CT/pelvis  01/30/2022-possible new peritoneal implant at the transverse colon, no significant change in other omental/peritoneal implants, stable chronic rectal wall thickening, status post removal of double-J left ureteral stent with no evidence of hydronephrosis or ureteral calculus, nonobstructing bilateral renal calculi Cycle 5 Lonsurf  02/05/2022 or 02/06/2022 Avastin  every 2 weeks Cycle 6 Lonsurf  03/03/2022 CTs 04/05/2022-increase in size and number of peritoneal implants compared to January 2024 central liver mass narrowing the anterior branch of the right portal vein, decreased biliary duct dilation, biliary stents in place, stable right cardiophrenic lymph nodes, separate pigtail stent in the lumen of the proximal duodenum Cycle 1 FOLFOX 04/14/2022 CT abdomen/pelvis 04/16/2022-stent within the third portion of the duodenum, stable peritoneal nodules, indwelling biliary stents with mild left biliary duct dilation Cycle 2 FOLFOX 04/28/2022, Emend and prophylactic dexamethasone  added Cycle 3 FOLFOX 05/12/2022, Aloxi , Emend, prophylactic dexamethasone , Compazine  and lorazepam  as needed Chemotherapy held 05/25/2020 due to severe hypertension, evaluated in the emergency department Cycle 4 FOLFOX 06/03/2022 Cycle 5 FOLFOX 06/16/2022, oxaliplatin  dose reduced and 5-FU bolus eliminated due to neutropenia CT 06/25/2022-unchanged soft tissue at the hepatic hilum, unchanged, bile duct stent, decreased size of peritoneal and omental nodules and diminished volume of likely fluid in the lower left pelvis Cycle 6 FOLFOX 06/30/2022 Cycle 7 FOLFOX 07/21/2022 CT 07/24/2022 (in the emergency department with nausea, headache, abdominal pain)-no acute pathology.  No significant interval change in omental nodularity. Cycle 8 FOLFOX 08/04/2022, pruritus on the palms at the end of the oxaliplatin  infusion, Pepcid  and Benadryl  given, symptoms resolved, infusion not resumed Cycle 9 FOLFOX 08/18/2022-Pepcid  and Benadryl  added to premedications,  Oxaliplatin  diluted in a larger volume and infusion time increased Cycle 10 FOLFOX 09/08/2022 Cycle 11 FOLFOX 09/22/2022 10/06/2022 patient declined treatment CTs 10/15/2022-similar soft tissue fullness in the hepatic hilum with biliary stents in place, decrease in omental/peritoneal metastases Maintenance capecitabine  10/26/2022 CT abdomen/pelvis 11/19/2022-mild increase in tumor studding at the left paracolic gutter and omentum, circumferential wall thickening of the rectum, biliary stents in place with minimal increase in intrahepatic biliary dilation, nonobstructive renal calculi Colonoscopy 11/23/2022-anastomosis at 15 cm from the anal verge-patent, 2 cm polypoid tissue at the anastomosis biopsied, invasive adenocarcinoma moderately differentiated, preserved expression of major MMR proteins CTs 12/15/2022: Possible developing nodule in the lateral left liver, increased omental nodularity, persistent wall thickening in the upper rectum CT abdomen/pelvis 12/24/2022:  Increased thickened fold in the transverse colon-infectious/inflammatory, 2.1 cm lateral left liver lesion, potential new 5 mm segment 5 lesion, stable biliary stents with intrahepatic biliary dilation, central biliary wall thickening near the hepatic hilum, multiple peritoneal/omental implants unchanged from 12/15/2022, but likely larger than 11/19/2022 Cycle 1 irinotecan /Panitumumab  01/06/2023 Cycle 2 irinotecan /Panitumumab  01/20/2023 CTs 01/24/2023-slight increase in omental nodularity. Cycle 3 irinotecan /Panitumumab  02/03/2023 Cycle 4 irinotecan /panitumumab  02/17/2023 CTs abdomen/pelvis 02/24/2023-subtly hypoattenuating lesion within the peripheral aspect of hepatic segment 2 redemonstrated.  Additional scattered subcentimeter low-density lesions within the liver too small to characterize.  Prominent intrahepatic biliary dilatation with biliary stents in place.  Similar degree of nodularity along the omentum and left paracolic gutter.  No  ascites. CTs 03/09/2023-indwelling endoscopically placed stents; level of intrahepatic biliary duct dilatation is slightly improved on the left and some on the right.  Gallbladder is dilated with wall edema which is increasing.  More nodular tissue extending up along the porta hepatis.  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  Rectum with nodular wall thickening with extension into the adjacent tissue, nodular areas seen extending perirectal and presacral spaces.  Peritoneal carcinomatosis again identified. Irinotecan /Panitumumab  04/02/2023 Irinotecan /panitumumab  04/16/2023 Irinotecan /Panitumumab  05/07/2023 Irinotecan /Panitumumab  06/19/2023 CT abdomen/pelvis 06/21/2023-stable heterogeneous enhancement throughout the liver with nodularity along the surface of the liver.  Small amount of perihepatic ascites.  Stable nodularity right abdomen.  Circumferential rectal wall thickening/mass unchanged. Irinotecan /Panitumumab  discontinued 07/10/2023 due to lack of clinical benefit/poor tolerance Appointment with Dr. Sheilda 07/23/2023-FOLFOX, guardant testing CTs 08/02/2023-mid rectal soft tissue mass, mildly progressive.  Right hepatic metastases, progressive.  Peritoneal disease/omental caking stable versus mildly progressive.  Small volume abdominopelvic ascites mildly progressive. Cycle 1 FOLFOX 08/05/2023 Cycle 2 FOLFOX 08/20/2023 (Pepcid  and Benadryl  daily x 2 days before treatment, Pepcid  continued for 2 days post oxaliplatin ; oxaliplatin  administration time increased) CTs 09/20/2023 grossly stable soft tissue mass within the mid rectum.  Stable cystic structure within the left lower pelvis, either loculated fluid, cystic peritoneal metastases or left adnexal cyst.  Interval development of mild left hydronephrosis and left hydroureter without clear cause for obstruction identified.  Stable peritoneal metastases and omental caking most pronounced in the right upper quadrant.  Diffuse metastatic disease about the  right lobe of the liver subjectively worse since the prior exam.  Small volume ascites. Cycle 3 FOLFOX planned 10/01/2023     2. Hypertension G4 P3, twins Kidney stones-bilateral staghorn renal calculi on CT 08/10/2020 Infected Port-A-Cath 09/12/2019-placed on Augmentin , referred for Port-A-Cath removal; Port-A-Cath removed 09/14/2019; PICC line placed 09/14/2019; culture staph aureus.  Course of Septra  completed. Neutropenia secondary to chemotherapy-Udenyca  added with cycle 8 FOLFIRI Right nephrostomy tube 09/12/2020; stent placed 10/09/2020, percutaneous nephrostolithotomy treatment of right-sided kidney stones 10/09/2020, malpositioned right ureter stent replacement 10/23/2020, right ureter stent removed 10/29/2020 Percutaneous left nephrostolithotomy 10/01/2021-no renal stone identified, left ureter stent left in place Cystoscopy 10/14/2021-stent removed 8.  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  2 moderate biliary strictures found in the hepatic duct system, malignant appearing.  Left and right hepatic duct and all  intrahepatic branches were moderately dilated secondary to the strictures.  The strictures were dilated.  Plastic stent placed into the left hepatic duct and right hepatic duct. 9.  Admission 04/16/2022 with nausea/vomiting and abdominal pain, felt to be acute toxicity related to chemotherapy, discharged  home 04/18/2022 10.  Admission 11/19/2022 with nausea/vomiting and abdominal pain-improved 11.  Admission 12/15/2022 with fever and right submandibular swelling/tenderness CT neck 12/15/2022: 2 mm distal right submandibular duct calculus with mild inflammatory change adjacent to the right submandibular gland 12.  Admission 12/24/2022 with a fever nausea/vomiting, diarrhea, Klebsiella bacteremia completed course of Augmentin  13.  Admission with abdominal pain and nausea/vomiting 03/09/2023 through 03/14/2023-concern for gallbladder inflammation.  Not felt to be a surgical candidate.  Percutaneous cholecystostomy tube placed 03/11/2023.  Tube removed 04/24/2023. 14.  Admission 06/20/2023 with abdominal pain and constipation, pain improved partially following a bowel movement 15.  Admission 08/02/2023 with abdominal pain, cycle 1 salvage FOLFOX 08/05/2023 16.  Admission 09/20/2023 with nausea and abdominal pain  Disposition: April [name] appears unchanged.  She has completed 2 cycles of FOLFOX, last treated 08/20/2023.  She was hospitalized recently with intractable nausea/vomiting/pain.  We discussed the concern for cancer progression based on the recent CEA, her pain and recent CT findings.  She feels she is deriving benefit from the current treatment and would like to proceed with 2-3 more cycles before the next restaging CT evaluation.  Plan to proceed with cycle 3 FOLFOX as scheduled 10/01/2023.  CBC and chemistry panel reviewed.  Labs adequate for treatment.  She has persistent hypokalemia.  She did not resume oral potassium when discharged from the hospital.  She will begin K-dur 20 mEq twice daily for the next 3 days, then daily.  She will return for a basic metabolic panel next week.  Of note, magnesium  is normal.  She will return for follow-up and treatment in 2 weeks.  We are available to see her sooner if needed.  She continues close follow-up with palliative care for pain management.  Plan reviewed with Dr.  Cloretta.  April [name] ANP/GNP-BC   09/30/2023  2:44 PM

## 2023-09-30 NOTE — Patient Instructions (Signed)
 PICC Home Care Guide A peripherally inserted central catheter (PICC) is a form of IV access that allows medicines and IV fluids to be quickly put into the blood and spread throughout the body. The PICC is a long, thin, flexible tube (catheter) that is put into a vein in a person's arm or leg. The catheter ends in a large vein just outside the heart called the superior vena cava (SVC). After the PICC is put in, a chest X-ray may be done to make sure that it is in the right place. A PICC may be placed for different reasons, such as: To give medicines and liquid nutrition. To give IV fluids and blood products. To take blood samples often. If there is trouble placing a peripheral intravenous (PIV) catheter. If cared for properly, a PICC can remain in place for many months. Having a PICC can allow you to go home from the hospital sooner and continue treatment at home. Medicines and PICC care can be managed at home by a family member, caregiver, or home health care team. What are the risks? Generally, having a PICC is safe. However, problems may occur, including: A blood clot (thrombus) forming in or at the end of the PICC. A blood clot forming in a vein (deep vein thrombosis) or traveling to the lung (pulmonary embolism). Inflammation of the vein (phlebitis) in which the PICC is placed. Infection at the insertion site or in the blood. Blood infections from central lines, like PICCs, can be serious and often require a hospital stay. PICC malposition, or PICC movement or poor placement. A break or cut in the PICC. Do not use scissors near the PICC. Nerve or tendon irritation or injury during PICC insertion. How to care for your PICC Please follow the specific guidelines provided by your health care provider. Preventing infection You and any caregivers should wash your hands often with soap and water for at least 20 seconds. Wash hands: Before touching the PICC or the infusion device. Before changing a  bandage (dressing). Do not change the dressing unless you have been taught to do so and have shown you are able to change it safely. Flush the PICC as told. Tell your health care provider right away if the PICC is hard to flush or does not flush. Do not use force to flush the PICC. Use clean and germ-free (sterile) supplies only. Keep the supplies in a dry place. Do not reuse needles, syringes, or any other supplies. Reusing supplies can lead to infection. Keep the PICC dressing dry and secure it with tape if the edges stop sticking to your skin. Check your PICC insertion site every day for signs of infection. Check for: Redness, swelling, or pain. Fluid or blood. Warmth. Pus or a bad smell. Preventing other problems Do not use a syringe that is less than 10 mL to flush the PICC. Do not have your blood pressure checked on the arm in which the PICC is placed. Do not ever pull or tug on the PICC. Keep it secured to your arm with tape or a stretch wrap when not in use. Do not take the PICC out yourself. Only a trained health care provider should remove the PICC. Keep pets and children away from your PICC. How to care for your PICC dressing Keep your PICC dressing clean and dry to prevent infection. Do not take baths, swim, or use a hot tub until your health care provider approves. Ask your health care provider if you can take  showers. You may only be allowed to take sponge baths. When you are allowed to shower: Ask your health care provider to teach you how to wrap the PICC. Cover the PICC with clear plastic wrap and tape to keep it dry while showering. Follow instructions from your health care provider about how to take care of your insertion site and dressing. Make sure you: Wash your hands with soap and water for at least 20 seconds before and after you change your dressing. If soap and water are not available, use hand sanitizer. Change your dressing only if taught to do so by your health care  provider. Your PICC dressing needs to be changed if it becomes loose or wet. Leave stitches (sutures), skin glue, or adhesive strips in place. These skin closures may need to stay in place for 2 weeks or longer. If adhesive strip edges start to loosen and curl up, you may trim the loose edges. Do not remove adhesive strips completely unless your health care provider tells you to do that. Follow these instructions at home: Disposal of supplies Throw away any syringes in a disposal container that is meant for sharp items (sharps container). You can buy a sharps container from a pharmacy, or you can make one by using an empty, hard plastic bottle with a lid. Place any used dressings or infusion bags into a plastic bag. Throw that bag in the trash. General instructions  Always carry your PICC identification card or wear a medical alert bracelet. Keep the tube clamped at all times, unless it is being used. Always carry a smooth-edge clamp with you to clamp the PICC if it breaks. Do not use scissors or sharp objects near the tube. You may bend your arm and move it freely. If your PICC is near or at the bend of your elbow, avoid activity with repeated motion at the elbow. Avoid lifting heavy objects as told by your health care provider. Keep all follow-up visits. This is important. You will need to have your PICC dressing changed at least once a week. Contact a health care provider if: You have pain in your arm, ear, face, or teeth. You have a fever or chills. You have redness, swelling, or pain around the insertion site. You have fluid or blood coming from the insertion site. Your insertion site feels warm to the touch. You have pus or a bad smell coming from the insertion site. Your skin feels hard and raised around the insertion site. Your PICC dressing has gotten wet or is coming off and you have not been taught how to change it. Get help right away if: You have problems with your PICC, such as  your PICC: Was tugged or pulled and has partially come out. Do not  push the PICC back in. Cannot be flushed, is hard to flush, or leaks around the insertion site when it is flushed. Makes a flushing sound when it is flushed. Appears to have a hole or tear. Is accidentally pulled all the way out. If this happens, cover the insertion site with a gauze dressing. Do not throw the PICC away. Your health care provider will need to check it to be sure the entire catheter came out. You feel your heart racing or skipping beats, or you have chest pain. You have shortness of breath or trouble breathing. You have swelling, redness, warmth, or pain in the arm in which the PICC is placed. You have a red streak going up your arm that  starts under the PICC dressing. These symptoms may be an emergency. Get help right away. Call 911. Do not wait to see if the symptoms will go away. Do not drive yourself to the hospital. Summary A peripherally inserted central catheter (PICC) is a long, thin, flexible tube (catheter) that is put into a vein in the arm or leg. If cared for properly, a PICC can remain in place for many months. Having a PICC can allow you to go home from the hospital sooner and continue treatment at home. The PICC is inserted using a germ-free (sterile) technique by a specially trained health care provider. Only a trained health care provider should remove it. Do not have your blood pressure checked on the arm in which your PICC is placed. Always keep your PICC identification card with you. This information is not intended to replace advice given to you by your health care provider. Make sure you discuss any questions you have with your health care provider. Document Revised: 07/18/2020 Document Reviewed: 07/18/2020 Elsevier Patient Education  2024 [employer].

## 2023-10-01 ENCOUNTER — Inpatient Hospital Stay

## 2023-10-01 ENCOUNTER — Telehealth: Payer: Self-pay | Admitting: Dietician

## 2023-10-01 ENCOUNTER — Other Ambulatory Visit

## 2023-10-01 ENCOUNTER — Ambulatory Visit: Admitting: Nurse Practitioner

## 2023-10-01 VITALS — BP 138/88 | HR 90 | Temp 97.6°F | Resp 18

## 2023-10-01 DIAGNOSIS — C19 Malignant neoplasm of rectosigmoid junction: Secondary | ICD-10-CM

## 2023-10-01 MED ORDER — DEXAMETHASONE SODIUM PHOSPHATE 10 MG/ML IJ SOLN
10.0000 mg | Freq: Once | INTRAMUSCULAR | Status: AC
  Administered 2023-10-01: 10 mg via INTRAVENOUS
  Filled 2023-10-01: qty 1

## 2023-10-01 MED ORDER — SODIUM CHLORIDE 0.9 % IV SOLN
150.0000 mg | Freq: Once | INTRAVENOUS | Status: AC
  Administered 2023-10-01: 150 mg via INTRAVENOUS
  Filled 2023-10-01: qty 150

## 2023-10-01 MED ORDER — FLUOROURACIL CHEMO INJECTION 500 MG/10ML
300.0000 mg/m2 | Freq: Once | INTRAVENOUS | Status: AC
  Administered 2023-10-01: 500 mg via INTRAVENOUS
  Filled 2023-10-01: qty 10

## 2023-10-01 MED ORDER — DEXTROSE 5 % IV SOLN: INTRAVENOUS | Status: DC

## 2023-10-01 MED ORDER — PALONOSETRON HCL INJECTION 0.25 MG/5ML
0.2500 mg | Freq: Once | INTRAVENOUS | Status: AC
  Administered 2023-10-01: 0.25 mg via INTRAVENOUS
  Filled 2023-10-01: qty 5

## 2023-10-01 MED ORDER — DIPHENHYDRAMINE HCL 50 MG/ML IJ SOLN
25.0000 mg | Freq: Once | INTRAMUSCULAR | Status: AC
  Administered 2023-10-01: 25 mg via INTRAVENOUS
  Filled 2023-10-01: qty 1

## 2023-10-01 MED ORDER — SODIUM CHLORIDE 0.9 % IV SOLN
1800.0000 mg/m2 | INTRAVENOUS | Status: DC
  Administered 2023-10-01: 2900 mg via INTRAVENOUS
  Filled 2023-10-01: qty 58

## 2023-10-01 MED ORDER — FAMOTIDINE IN NACL 20-0.9 MG/50ML-% IV SOLN
20.0000 mg | Freq: Once | INTRAVENOUS | Status: AC
  Administered 2023-10-01: 20 mg via INTRAVENOUS
  Filled 2023-10-01: qty 50

## 2023-10-01 MED ORDER — OXALIPLATIN CHEMO INJECTION 100 MG/20ML
65.0000 mg/m2 | Freq: Once | INTRAVENOUS | Status: AC
  Administered 2023-10-01: 100 mg via INTRAVENOUS
  Filled 2023-10-01: qty 20

## 2023-10-01 MED ORDER — LEUCOVORIN CALCIUM INJECTION 350 MG
300.0000 mg/m2 | Freq: Once | INTRAVENOUS | Status: AC
  Administered 2023-10-01: 480 mg via INTRAVENOUS
  Filled 2023-10-01: qty 24

## 2023-10-01 NOTE — Progress Notes (Signed)
 Confirms she took Benadryl  and Pepcid  at home yesterday and the night before.  She tolerated the oxaliplatin  titration well with no adverse effects.

## 2023-10-01 NOTE — Telephone Encounter (Signed)
Third and final attempt to reach patient by telephone. Have left messages with return number. Please consult RD for future needs.    April Manson, RDN, LDN Registered Dietitian, [place] Part Time Remote (Usual office hours: Tuesday-Thursday) Mobile: [phone] Remote Office: [phone]

## 2023-10-01 NOTE — Patient Instructions (Signed)
 [facility] - A DEPT OF [facility]  Discharge Instructions: Thank you for choosing [facility] to provide your oncology and hematology care.   If you have a lab appointment with the Cancer Center, please go directly to the Cancer Center and check in at the registration area.   Wear comfortable clothing and clothing appropriate for easy access to any Portacath or PICC line.   We strive to give you quality time with your provider. You may need to reschedule your appointment if you arrive late (15 or more minutes).  Arriving late affects you and other patients whose appointments are after yours.  Also, if you miss three or more appointments without notifying the office, you may be dismissed from the clinic at the provider's discretion.      For prescription refill requests, have your pharmacy contact our office and allow 72 hours for refills to be completed.    Today you received the following chemotherapy and/or immunotherapy agents: oxaliplatin , leucovorin  and fluorouracil .      To help prevent nausea and vomiting after your treatment, we encourage you to take your nausea medication as directed.  BELOW ARE SYMPTOMS THAT SHOULD BE REPORTED IMMEDIATELY: *FEVER GREATER THAN 100.4 F (38 C) OR HIGHER *CHILLS OR SWEATING *NAUSEA AND VOMITING THAT IS NOT CONTROLLED WITH YOUR NAUSEA MEDICATION *UNUSUAL SHORTNESS OF BREATH *UNUSUAL BRUISING OR BLEEDING *URINARY PROBLEMS (pain or burning when urinating, or frequent urination) *BOWEL PROBLEMS (unusual diarrhea, constipation, pain near the anus) TENDERNESS IN MOUTH AND THROAT WITH OR WITHOUT PRESENCE OF ULCERS (sore throat, sores in mouth, or a toothache) UNUSUAL RASH, SWELLING OR PAIN  UNUSUAL VAGINAL DISCHARGE OR ITCHING   Items with * indicate a potential emergency and should be followed up as soon as possible or go to the Emergency Department if any problems should occur.  Please show the  CHEMOTHERAPY ALERT CARD or IMMUNOTHERAPY ALERT CARD at check-in to the Emergency Department and triage nurse.  Should you have questions after your visit or need to cancel or reschedule your appointment, please contact [facility] - A DEPT OF [facility]  Dept: [phone]  and follow the prompts.  Office hours are 8:00 a.m. to 4:30 p.m. Monday - Friday. Please note that voicemails left after 4:00 p.m. may not be returned until the following business day.  We are closed weekends and major holidays. You have access to a nurse at all times for urgent questions. Please call the main number to the clinic Dept: [phone] and follow the prompts.   For any non-urgent questions, you may also contact your provider using MyChart. We now offer e-Visits for anyone 8 and older to request care online for non-urgent symptoms. For details visit [email or web address].   Also download the MyChart app! Go to the app store, search MyChart, open the app, select [facility], and log in with your MyChart username and password. __________________________________________________ Oxaliplatin  Injection What is this medication? OXALIPLATIN  (ox AL i PLA tin) treats colorectal cancer. It works by slowing down the growth of cancer cells. This medicine may be used for other purposes; ask your health care provider or pharmacist if you have questions. COMMON BRAND NAME(S): Eloxatin  What should I tell my care team before I take this medication? They need to know if you have any of these conditions: Heart disease History of irregular heartbeat or rhythm Liver disease Low blood cell levels (white cells, red cells, and platelets) Lung or  breathing disease, such as asthma Take medications that treat or prevent blood clots Tingling of the fingers, toes, or other nerve disorder An unusual or allergic reaction to oxaliplatin , other medications, foods, dyes, or preservatives If you or your  partner are pregnant or trying to get pregnant Breast-feeding How should I use this medication? This medication is injected into a vein. It is given by your care team in a hospital or clinic setting. Talk to your care team about the use of this medication in children. Special care may be needed. Overdosage: If you think you have taken too much of this medicine contact a poison control center or emergency room at once. NOTE: This medicine is only for you. Do not share this medicine with others. What if I miss a dose? Keep appointments for follow-up doses. It is important not to miss a dose. Call your care team if you are unable to keep an appointment. What may interact with this medication? Do not take this medication with any of the following: Cisapride Dronedarone Pimozide Thioridazine This medication may also interact with the following: Aspirin and aspirin-like medications Certain medications that treat or prevent blood clots, such as warfarin, apixaban, dabigatran, and rivaroxaban Cisplatin Cyclosporine Diuretics Medications for infection, such as acyclovir, adefovir, amphotericin B, bacitracin, cidofovir, foscarnet, ganciclovir, gentamicin, pentamidine, vancomycin  NSAIDs, medications for pain and inflammation, such as ibuprofen  or naproxen Other medications that cause heart rhythm changes Pamidronate Zoledronic acid This list may not describe all possible interactions. Give your health care provider a list of all the medicines, herbs, non-prescription drugs, or dietary supplements you use. Also tell them if you smoke, drink alcohol, or use illegal drugs. Some items may interact with your medicine. What should I watch for while using this medication? Your condition will be monitored carefully while you are receiving this medication. You may need blood work while taking this medication. This medication may make you feel generally unwell. This is not uncommon as chemotherapy can  affect healthy cells as well as cancer cells. Report any side effects. Continue your course of treatment even though you feel ill unless your care team tells you to stop. This medication may increase your risk of getting an infection. Call your care team for advice if you get a fever, chills, sore throat, or other symptoms of a cold or flu. Do not treat yourself. Try to avoid being around people who are sick. Avoid taking medications that contain aspirin, acetaminophen , ibuprofen , naproxen, or ketoprofen unless instructed by your care team. These medications may hide a fever. Be careful brushing or flossing your teeth or using a toothpick because you may get an infection or bleed more easily. If you have any dental work done, tell your dentist you are receiving this medication. This medication can make you more sensitive to cold. Do not drink cold drinks or use ice. Cover exposed skin before coming in contact with cold temperatures or cold objects. When out in cold weather wear warm clothing and cover your mouth and nose to warm the air that goes into your lungs. Tell your care team if you get sensitive to the cold. Talk to your care team if you or your partner are pregnant or think either of you might be pregnant. This medication can cause serious birth defects if taken during pregnancy and for 9 months after the last dose. A negative pregnancy test is required before starting this medication. A reliable form of contraception is recommended while taking this medication and for 9  months after the last dose. Talk to your care team about effective forms of contraception. Do not father a child while taking this medication and for 6 months after the last dose. Use a condom while having sex during this time period. Do not breastfeed while taking this medication and for 3 months after the last dose. This medication may cause infertility. Talk to your care team if you are concerned about your fertility. What side  effects may I notice from receiving this medication? Side effects that you should report to your care team as soon as possible: Allergic reactions--skin rash, itching, hives, swelling of the face, lips, tongue, or throat Bleeding--bloody or black, tar-like stools, vomiting blood or brown material that looks like coffee grounds, red or dark brown urine, small red or purple spots on skin, unusual bruising or bleeding Dry cough, shortness of breath or trouble breathing Heart rhythm changes--fast or irregular heartbeat, dizziness, feeling faint or lightheaded, chest pain, trouble breathing Infection--fever, chills, cough, sore throat, wounds that don't heal, pain or trouble when passing urine, general feeling of discomfort or being unwell Liver injury--right upper belly pain, loss of appetite, nausea, light-colored stool, dark yellow or brown urine, yellowing skin or eyes, unusual weakness or fatigue Low red blood cell level--unusual weakness or fatigue, dizziness, headache, trouble breathing Muscle injury--unusual weakness or fatigue, muscle pain, dark yellow or brown urine, decrease in amount of urine Pain, tingling, or numbness in the hands or feet Sudden and severe headache, confusion, change in vision, seizures, which may be signs of posterior reversible encephalopathy syndrome (PRES) Unusual bruising or bleeding Side effects that usually do not require medical attention (report to your care team if they continue or are bothersome): Diarrhea Nausea Pain, redness, or swelling with sores inside the mouth or throat Unusual weakness or fatigue Vomiting This list may not describe all possible side effects. Call your doctor for medical advice about side effects. You may report side effects to FDA at 1-800-FDA-1088. Where should I keep my medication? This medication is given in a hospital or clinic. It will not be stored at home. NOTE: This sheet is a summary. It may not cover all possible  information. If you have questions about this medicine, talk to your doctor, pharmacist, or health care provider.  2024 Elsevier/Gold Standard (2022-12-12 00:00:00) _______________________________________________________ Leucovorin  Injection What is this medication? LEUCOVORIN  (loo koe VOR in) prevents side effects from certain medications, such as methotrexate. It works by increasing folate levels. This helps protect healthy cells in your body. It may also be used to treat anemia caused by low levels of folate. It can also be used with fluorouracil , a type of chemotherapy, to treat colorectal cancer. It works by increasing the effects of fluorouracil  in the body. This medicine may be used for other purposes; ask your health care provider or pharmacist if you have questions. What should I tell my care team before I take this medication? They need to know if you have any of these conditions: Anemia from low levels of vitamin B12 in the blood An unusual or allergic reaction to leucovorin , folic acid, other medications, foods, dyes, or preservatives Pregnant or trying to get pregnant Breastfeeding How should I use this medication? This medication is injected into a vein or a muscle. It is given by your care team in a hospital or clinic setting. Talk to your care team about the use of this medication in children. Special care may be needed. Overdosage: If you think you have taken too  much of this medicine contact a poison control center or emergency room at once. NOTE: This medicine is only for you. Do not share this medicine with others. What if I miss a dose? Keep appointments for follow-up doses. It is important not to miss your dose. Call your care team if you are unable to keep an appointment. What may interact with this medication? Capecitabine  Fluorouracil  Phenobarbital Phenytoin Primidone Trimethoprim ;sulfamethoxazole  This list may not describe all possible interactions. Give your  health care provider a list of all the medicines, herbs, non-prescription drugs, or dietary supplements you use. Also tell them if you smoke, drink alcohol, or use illegal drugs. Some items may interact with your medicine. What should I watch for while using this medication? Your condition will be monitored carefully while you are receiving this medication. This medication may increase the side effects of 5-fluorouracil . Tell your care team if you have diarrhea or mouth sores that do not get better or that get worse. What side effects may I notice from receiving this medication? Side effects that you should report to your care team as soon as possible: Allergic reactions--skin rash, itching, hives, swelling of the face, lips, tongue, or throat This list may not describe all possible side effects. Call your doctor for medical advice about side effects. You may report side effects to FDA at 1-800-FDA-1088. Where should I keep my medication? This medication is given in a hospital or clinic. It will not be stored at home. NOTE: This sheet is a summary. It may not cover all possible information. If you have questions about this medicine, talk to your doctor, pharmacist, or health care provider.  2024 Elsevier/Gold Standard (2021-06-04 00:00:00) ____________________________________________________ Fluorouracil  Injection What is this medication? FLUOROURACIL  (flure oh YOOR a sil) treats some types of cancer. It works by slowing down the growth of cancer cells. This medicine may be used for other purposes; ask your health care provider or pharmacist if you have questions. COMMON BRAND NAME(S): Adrucil  What should I tell my care team before I take this medication? They need to know if you have any of these conditions: Blood disorders Dihydropyrimidine dehydrogenase (DPD) deficiency Infection, such as chickenpox, cold sores, herpes Kidney disease Liver disease Poor nutrition Recent or ongoing  radiation therapy An unusual or allergic reaction to fluorouracil , other medications, foods, dyes, or preservatives If you or your partner are pregnant or trying to get pregnant Breast-feeding How should I use this medication? This medication is injected into a vein. It is administered by your care team in a hospital or clinic setting. Talk to your care team about the use of this medication in children. Special care may be needed. Overdosage: If you think you have taken too much of this medicine contact a poison control center or emergency room at once. NOTE: This medicine is only for you. Do not share this medicine with others. What if I miss a dose? Keep appointments for follow-up doses. It is important not to miss your dose. Call your care team if you are unable to keep an appointment. What may interact with this medication? Do not take this medication with any of the following: Live virus vaccines This medication may also interact with the following: Medications that treat or prevent blood clots, such as warfarin, enoxaparin , dalteparin This list may not describe all possible interactions. Give your health care provider a list of all the medicines, herbs, non-prescription drugs, or dietary supplements you use. Also tell them if you smoke, drink alcohol, or  use illegal drugs. Some items may interact with your medicine. What should I watch for while using this medication? Your condition will be monitored carefully while you are receiving this medication. This medication may make you feel generally unwell. This is not uncommon as chemotherapy can affect healthy cells as well as cancer cells. Report any side effects. Continue your course of treatment even though you feel ill unless your care team tells you to stop. In some cases, you may be given additional medications to help with side effects. Follow all directions for their use. This medication may increase your risk of getting an infection.  Call your care team for advice if you get a fever, chills, sore throat, or other symptoms of a cold or flu. Do not treat yourself. Try to avoid being around people who are sick. This medication may increase your risk to bruise or bleed. Call your care team if you notice any unusual bleeding. Be careful brushing or flossing your teeth or using a toothpick because you may get an infection or bleed more easily. If you have any dental work done, tell your dentist you are receiving this medication. Avoid taking medications that contain aspirin, acetaminophen , ibuprofen , naproxen, or ketoprofen unless instructed by your care team. These medications may hide a fever. Do not treat diarrhea with over the counter products. Contact your care team if you have diarrhea that lasts more than 2 days or if it is severe and watery. This medication can make you more sensitive to the sun. Keep out of the sun. If you cannot avoid being in the sun, wear protective clothing and sunscreen. Do not use sun lamps, tanning beds, or tanning booths. Talk to your care team if you or your partner wish to become pregnant or think you might be pregnant. This medication can cause serious birth defects if taken during pregnancy and for 3 months after the last dose. A reliable form of contraception is recommended while taking this medication and for 3 months after the last dose. Talk to your care team about effective forms of contraception. Do not father a child while taking this medication and for 3 months after the last dose. Use a condom while having sex during this time period. Do not breastfeed while taking this medication. This medication may cause infertility. Talk to your care team if you are concerned about your fertility. What side effects may I notice from receiving this medication? Side effects that you should report to your care team as soon as possible: Allergic reactions--skin rash, itching, hives, swelling of the face, lips,  tongue, or throat Heart attack--pain or tightness in the chest, shoulders, arms, or jaw, nausea, shortness of breath, cold or clammy skin, feeling faint or lightheaded Heart failure--shortness of breath, swelling of the ankles, feet, or hands, sudden weight gain, unusual weakness or fatigue Heart rhythm changes--fast or irregular heartbeat, dizziness, feeling faint or lightheaded, chest pain, trouble breathing High ammonia level--unusual weakness or fatigue, confusion, loss of appetite, nausea, vomiting, seizures Infection--fever, chills, cough, sore throat, wounds that don't heal, pain or trouble when passing urine, general feeling of discomfort or being unwell Low red blood cell level--unusual weakness or fatigue, dizziness, headache, trouble breathing Pain, tingling, or numbness in the hands or feet, muscle weakness, change in vision, confusion or trouble speaking, loss of balance or coordination, trouble walking, seizures Redness, swelling, and blistering of the skin over hands and feet Severe or prolonged diarrhea Unusual bruising or bleeding Side effects that usually do not  require medical attention (report to your care team if they continue or are bothersome): Dry skin Headache Increased tears Nausea Pain, redness, or swelling with sores inside the mouth or throat Sensitivity to light Vomiting This list may not describe all possible side effects. Call your doctor for medical advice about side effects. You may report side effects to FDA at 1-800-FDA-1088. Where should I keep my medication? This medication is given in a hospital or clinic. It will not be stored at home. NOTE: This sheet is a summary. It may not cover all possible information. If you have questions about this medicine, talk to your doctor, pharmacist, or health care provider.  2024 Elsevier/Gold Standard (2021-05-07 00:00:00)

## 2023-10-02 ENCOUNTER — Ambulatory Visit

## 2023-10-03 ENCOUNTER — Encounter (HOSPITAL_COMMUNITY): Payer: Self-pay | Admitting: Internal Medicine

## 2023-10-03 ENCOUNTER — Other Ambulatory Visit: Payer: Self-pay

## 2023-10-03 ENCOUNTER — Emergency Department (HOSPITAL_COMMUNITY)

## 2023-10-03 ENCOUNTER — Inpatient Hospital Stay (HOSPITAL_COMMUNITY)
Admission: EM | Admit: 2023-10-03 | Discharge: 2023-11-14 | DRG: 947 | Disposition: E | Attending: Internal Medicine | Admitting: Internal Medicine

## 2023-10-03 ENCOUNTER — Encounter

## 2023-10-03 ENCOUNTER — Encounter: Payer: Self-pay | Admitting: Oncology

## 2023-10-03 ENCOUNTER — Inpatient Hospital Stay

## 2023-10-03 DIAGNOSIS — Z66 Do not resuscitate: Secondary | ICD-10-CM | POA: Diagnosis not present

## 2023-10-03 DIAGNOSIS — Z9079 Acquired absence of other genital organ(s): Secondary | ICD-10-CM

## 2023-10-03 DIAGNOSIS — Z9221 Personal history of antineoplastic chemotherapy: Secondary | ICD-10-CM

## 2023-10-03 DIAGNOSIS — R7989 Other specified abnormal findings of blood chemistry: Secondary | ICD-10-CM | POA: Diagnosis present

## 2023-10-03 DIAGNOSIS — D630 Anemia in neoplastic disease: Secondary | ICD-10-CM | POA: Diagnosis present

## 2023-10-03 DIAGNOSIS — Z8679 Personal history of other diseases of the circulatory system: Secondary | ICD-10-CM

## 2023-10-03 DIAGNOSIS — I160 Hypertensive urgency: Secondary | ICD-10-CM | POA: Diagnosis present

## 2023-10-03 DIAGNOSIS — Z6820 Body mass index (BMI) 20.0-20.9, adult: Secondary | ICD-10-CM

## 2023-10-03 DIAGNOSIS — C19 Malignant neoplasm of rectosigmoid junction: Secondary | ICD-10-CM | POA: Diagnosis not present

## 2023-10-03 DIAGNOSIS — R109 Unspecified abdominal pain: Secondary | ICD-10-CM | POA: Diagnosis present

## 2023-10-03 DIAGNOSIS — N19 Unspecified kidney failure: Secondary | ICD-10-CM | POA: Diagnosis not present

## 2023-10-03 DIAGNOSIS — Z833 Family history of diabetes mellitus: Secondary | ICD-10-CM

## 2023-10-03 DIAGNOSIS — Z87442 Personal history of urinary calculi: Secondary | ICD-10-CM

## 2023-10-03 DIAGNOSIS — D638 Anemia in other chronic diseases classified elsewhere: Secondary | ICD-10-CM | POA: Diagnosis not present

## 2023-10-03 DIAGNOSIS — G893 Neoplasm related pain (acute) (chronic): Principal | ICD-10-CM | POA: Diagnosis present

## 2023-10-03 DIAGNOSIS — C786 Secondary malignant neoplasm of retroperitoneum and peritoneum: Secondary | ICD-10-CM | POA: Diagnosis present

## 2023-10-03 DIAGNOSIS — E876 Hypokalemia: Secondary | ICD-10-CM | POA: Diagnosis present

## 2023-10-03 DIAGNOSIS — Z79899 Other long term (current) drug therapy: Secondary | ICD-10-CM

## 2023-10-03 DIAGNOSIS — K567 Ileus, unspecified: Secondary | ICD-10-CM | POA: Diagnosis present

## 2023-10-03 DIAGNOSIS — Z711 Person with feared health complaint in whom no diagnosis is made: Secondary | ICD-10-CM

## 2023-10-03 DIAGNOSIS — C189 Malignant neoplasm of colon, unspecified: Secondary | ICD-10-CM

## 2023-10-03 DIAGNOSIS — R4589 Other symptoms and signs involving emotional state: Secondary | ICD-10-CM

## 2023-10-03 DIAGNOSIS — J439 Emphysema, unspecified: Secondary | ICD-10-CM | POA: Diagnosis present

## 2023-10-03 DIAGNOSIS — R748 Abnormal levels of other serum enzymes: Secondary | ICD-10-CM | POA: Diagnosis present

## 2023-10-03 DIAGNOSIS — Z87891 Personal history of nicotine dependence: Secondary | ICD-10-CM

## 2023-10-03 DIAGNOSIS — K5900 Constipation, unspecified: Secondary | ICD-10-CM | POA: Diagnosis present

## 2023-10-03 DIAGNOSIS — C787 Secondary malignant neoplasm of liver and intrahepatic bile duct: Secondary | ICD-10-CM | POA: Diagnosis present

## 2023-10-03 DIAGNOSIS — R64 Cachexia: Secondary | ICD-10-CM | POA: Diagnosis present

## 2023-10-03 DIAGNOSIS — Z515 Encounter for palliative care: Secondary | ICD-10-CM

## 2023-10-03 DIAGNOSIS — E43 Unspecified severe protein-calorie malnutrition: Secondary | ICD-10-CM | POA: Diagnosis present

## 2023-10-03 DIAGNOSIS — Z90722 Acquired absence of ovaries, bilateral: Secondary | ICD-10-CM

## 2023-10-03 DIAGNOSIS — I10 Essential (primary) hypertension: Secondary | ICD-10-CM | POA: Diagnosis present

## 2023-10-03 DIAGNOSIS — Z7189 Other specified counseling: Secondary | ICD-10-CM

## 2023-10-03 DIAGNOSIS — Z823 Family history of stroke: Secondary | ICD-10-CM

## 2023-10-03 DIAGNOSIS — R112 Nausea with vomiting, unspecified: Secondary | ICD-10-CM | POA: Diagnosis present

## 2023-10-03 DIAGNOSIS — R54 Age-related physical debility: Secondary | ICD-10-CM | POA: Diagnosis present

## 2023-10-03 DIAGNOSIS — R000 Tachycardia, unspecified: Secondary | ICD-10-CM | POA: Diagnosis not present

## 2023-10-03 DIAGNOSIS — I7121 Aneurysm of the ascending aorta, without rupture: Secondary | ICD-10-CM | POA: Diagnosis present

## 2023-10-03 DIAGNOSIS — E860 Dehydration: Secondary | ICD-10-CM | POA: Diagnosis present

## 2023-10-03 DIAGNOSIS — Z888 Allergy status to other drugs, medicaments and biological substances status: Secondary | ICD-10-CM

## 2023-10-03 DIAGNOSIS — Z8249 Family history of ischemic heart disease and other diseases of the circulatory system: Secondary | ICD-10-CM

## 2023-10-03 DIAGNOSIS — F419 Anxiety disorder, unspecified: Secondary | ICD-10-CM | POA: Diagnosis present

## 2023-10-03 DIAGNOSIS — R7401 Elevation of levels of liver transaminase levels: Secondary | ICD-10-CM | POA: Diagnosis present

## 2023-10-03 LAB — COMPREHENSIVE METABOLIC PANEL WITH GFR
ALT: 60 U/L — ABNORMAL HIGH (ref 0–44)
AST: 118 U/L — ABNORMAL HIGH (ref 15–41)
Albumin: 3.4 g/dL — ABNORMAL LOW (ref 3.5–5.0)
Alkaline Phosphatase: 660 U/L — ABNORMAL HIGH (ref 38–126)
Anion gap: 13 (ref 5–15)
BUN: 11 mg/dL (ref 6–20)
CO2: 30 mmol/L (ref 22–32)
Calcium: 9.7 mg/dL (ref 8.9–10.3)
Chloride: 93 mmol/L — ABNORMAL LOW (ref 98–111)
Creatinine, Ser: 0.45 mg/dL (ref 0.44–1.00)
GFR, Estimated: >60 mL/min (ref >60)
Glucose, Bld: 96 mg/dL (ref 70–99)
Potassium: 3.5 mmol/L (ref 3.5–5.1)
Sodium: 135 mmol/L (ref 135–145)
Total Bilirubin: 0.8 mg/dL (ref 0.0–1.2)
Total Protein: 8.4 g/dL — ABNORMAL HIGH (ref 6.5–8.1)

## 2023-10-03 LAB — CBC
HCT: 33.9 % — ABNORMAL LOW (ref 36.0–46.0)
Hemoglobin: 10.8 g/dL — ABNORMAL LOW (ref 12.0–15.0)
MCH: 27.8 pg (ref 26.0–34.0)
MCHC: 31.9 g/dL (ref 30.0–36.0)
MCV: 87.4 fL (ref 80.0–100.0)
Platelets: 303 K/uL (ref 150–400)
RBC: 3.88 MIL/uL (ref 3.87–5.11)
RDW: 17.2 % — ABNORMAL HIGH (ref 11.5–15.5)
WBC: 8.4 K/uL (ref 4.0–10.5)
nRBC: 0 % (ref 0.0–0.2)

## 2023-10-03 LAB — URINALYSIS, ROUTINE W REFLEX MICROSCOPIC
Bilirubin Urine: NEGATIVE
Glucose, UA: NEGATIVE mg/dL
Hgb urine dipstick: NEGATIVE
Ketones, ur: NEGATIVE mg/dL
Leukocytes,Ua: NEGATIVE
Nitrite: NEGATIVE
Protein, ur: NEGATIVE mg/dL
Specific Gravity, Urine: >1.046 — ABNORMAL HIGH (ref 1.005–1.030)
pH: 6 (ref 5.0–8.0)

## 2023-10-03 LAB — LIPASE, BLOOD: Lipase: 21 U/L (ref 11–51)

## 2023-10-03 LAB — MAGNESIUM: Magnesium: 2.3 mg/dL (ref 1.7–2.4)

## 2023-10-03 MED ORDER — MELATONIN 3 MG PO TABS: 3.0000 mg | ORAL_TABLET | Freq: Every evening | ORAL | Status: DC | PRN

## 2023-10-03 MED ORDER — OXYCODONE-ACETAMINOPHEN 5-325 MG PO TABS: 2.0000 | ORAL_TABLET | ORAL | Status: DC | PRN

## 2023-10-03 MED ORDER — CHLORHEXIDINE GLUCONATE CLOTH 2 % EX PADS
6.0000 | MEDICATED_PAD | Freq: Every day | CUTANEOUS | Status: DC
  Administered 2023-10-04 – 2023-10-10 (×6): 6 via TOPICAL

## 2023-10-03 MED ORDER — ONDANSETRON HCL 4 MG/2ML IJ SOLN
4.0000 mg | Freq: Four times a day (QID) | INTRAMUSCULAR | Status: DC | PRN
  Administered 2023-10-05 – 2023-10-06 (×2): 4 mg via INTRAVENOUS
  Filled 2023-10-03 (×2): qty 2

## 2023-10-03 MED ORDER — SODIUM CHLORIDE 0.9% FLUSH
10.0000 mL | INTRAVENOUS | Status: DC | PRN
  Administered 2023-10-09: 30 mL

## 2023-10-03 MED ORDER — ONDANSETRON HCL 4 MG/2ML IJ SOLN
4.0000 mg | Freq: Once | INTRAMUSCULAR | Status: AC
  Administered 2023-10-03: 4 mg via INTRAVENOUS
  Filled 2023-10-03: qty 2

## 2023-10-03 MED ORDER — HYDROMORPHONE HCL 1 MG/ML IJ SOLN: 1.0000 mg | Freq: Once | INTRAMUSCULAR | Status: DC

## 2023-10-03 MED ORDER — SODIUM CHLORIDE 0.9% FLUSH
10.0000 mL | Freq: Two times a day (BID) | INTRAVENOUS | Status: DC
  Administered 2023-10-03 – 2023-10-04 (×2): 10 mL
  Administered 2023-10-04 – 2023-10-05 (×2): 20 mL
  Administered 2023-10-05: 10 mL
  Administered 2023-10-06: 40 mL
  Administered 2023-10-06: 10 mL
  Administered 2023-10-07: 20 mL
  Administered 2023-10-07 – 2023-10-08 (×2): 10 mL
  Administered 2023-10-08: 20 mL
  Administered 2023-10-09 – 2023-10-14 (×11): 10 mL

## 2023-10-03 MED ORDER — ACETAMINOPHEN 650 MG RE SUPP: 650.0000 mg | Freq: Four times a day (QID) | RECTAL | Status: DC | PRN

## 2023-10-03 MED ORDER — DIPHENHYDRAMINE HCL 50 MG/ML IJ SOLN: 12.5000 mg | Freq: Four times a day (QID) | INTRAMUSCULAR | Status: DC | PRN

## 2023-10-03 MED ORDER — HYDROMORPHONE HCL 1 MG/ML IJ SOLN
1.0000 mg | Freq: Once | INTRAMUSCULAR | Status: AC
  Administered 2023-10-03: 1 mg via INTRAVENOUS
  Filled 2023-10-03: qty 1

## 2023-10-03 MED ORDER — ENSURE PLUS HIGH PROTEIN PO LIQD
237.0000 mL | Freq: Two times a day (BID) | ORAL | Status: DC
  Administered 2023-10-04: 237 mL via ORAL

## 2023-10-03 MED ORDER — IOHEXOL 300 MG/ML  SOLN
100.0000 mL | Freq: Once | INTRAMUSCULAR | Status: AC | PRN
  Administered 2023-10-03: 100 mL via INTRAVENOUS

## 2023-10-03 MED ORDER — PROCHLORPERAZINE EDISYLATE 10 MG/2ML IJ SOLN
10.0000 mg | Freq: Four times a day (QID) | INTRAMUSCULAR | Status: DC | PRN
  Administered 2023-10-11 – 2023-10-12 (×4): 10 mg via INTRAVENOUS
  Filled 2023-10-03 (×4): qty 2

## 2023-10-03 MED ORDER — SODIUM CHLORIDE 0.9 % IV SOLN: INTRAVENOUS | Status: AC

## 2023-10-03 MED ORDER — ACETAMINOPHEN 325 MG PO TABS
650.0000 mg | ORAL_TABLET | Freq: Four times a day (QID) | ORAL | Status: DC | PRN
  Filled 2023-10-03: qty 2

## 2023-10-03 MED ORDER — DIPHENHYDRAMINE HCL 12.5 MG/5ML PO ELIX: 12.5000 mg | ORAL_SOLUTION | Freq: Four times a day (QID) | ORAL | Status: DC | PRN

## 2023-10-03 MED ORDER — HYDROMORPHONE HCL 1 MG/ML IJ SOLN
2.0000 mg | Freq: Once | INTRAMUSCULAR | Status: AC
  Administered 2023-10-03: 2 mg via INTRAVENOUS
  Filled 2023-10-03: qty 2

## 2023-10-03 MED ORDER — NALOXONE HCL 0.4 MG/ML IJ SOLN: 0.4000 mg | INTRAMUSCULAR | Status: DC | PRN

## 2023-10-03 MED ORDER — HYDROMORPHONE 1 MG/ML IV SOLN
INTRAVENOUS | Status: DC
  Administered 2023-10-03: 2 mg via INTRAVENOUS
  Administered 2023-10-03: 30 mg via INTRAVENOUS
  Administered 2023-10-04: 0.9 mg via INTRAVENOUS
  Filled 2023-10-03: qty 30

## 2023-10-03 MED ORDER — DIAZEPAM 5 MG/ML IJ SOLN: 5.0000 mg | INTRAMUSCULAR | Status: DC | PRN

## 2023-10-03 MED ORDER — HYDROMORPHONE HCL 1 MG/ML IJ SOLN: 1.0000 mg | INTRAMUSCULAR | Status: DC | PRN

## 2023-10-03 NOTE — H&P (Signed)
 History and Physical      April [name] FMW:[id number] DOB: [birth year] DOA: 10/03/2023; DOS: 10/03/2023  PCP: Oris Camie BRAVO, NP *** Patient coming from: home ***  I have personally briefly reviewed patient's old medical records in [facility]  Chief Complaint: ***  HPI: April [name] is a 48 y.o. female with medical history significant for *** who is admitted to [facility] on 10/03/2023 with *** after presenting from home*** to [facility] ED complaining of ***.   ***        ***  ED Course:  Vital signs in the ED were notable for the following: ***  Labs were notable for the following: ***  Per my interpretation, EKG in ED demonstrated the following:  ***  Imaging in the ED, per corresponding formal radiology read, was notable for the following: ***  While in the ED, the following were administered: ***  Subsequently, the patient was admitted  ***  ***red   Review of Systems: As per HPI otherwise 10 point review of systems negative.   Past Medical History:  Diagnosis Date   Ascending aortic aneurysm (HCC) 12/16/2022   Benign essential hypertension 10/06/2017   Last Assessment & Plan:  Formatting of this note might be different from the original. Patient's blood pressure noted to be elevated at today's visit 188/98.  Patient will be provided with antihypertensives in the treatment center if needed in order to meet her Avastin  parameters.  Patient recommended to follow up with her primary care physician regarding medication management.  Patient is not cur   [facility])    Change in bowel habits 06/28/2022   Colon cancer (HCC)    History of colon cancer 06/28/2022   Hypertension    Prolonged QT interval 01/07/2018   Renal calculi    bilateral   Sepsis due to undetermined organism (HCC) 12/24/2022   Sialoadenitis of submandibular gland 12/16/2022    Past Surgical History:  Procedure Laterality Date   BALLOON DILATION N/A  02/26/2023   Procedure: BALLOON DILATION;  Surgeon: Wilhelmenia Aloha Raddle., MD;  Location: [facility];  Service: Gastroenterology;  Laterality: N/A;   BILIARY BRUSHING  03/24/2022   Procedure: BILIARY BRUSHING;  Surgeon: Wilhelmenia Aloha Raddle., MD;  Location: [facility] ENDOSCOPY;  Service: Gastroenterology;;   BILIARY DILATION  03/24/2022   Procedure: BILIARY DILATION;  Surgeon: Wilhelmenia Aloha Raddle., MD;  Location: [facility] ENDOSCOPY;  Service: Gastroenterology;;   BILIARY STENT PLACEMENT  03/24/2022   Procedure: BILIARY STENT PLACEMENT;  Surgeon: Wilhelmenia Aloha Raddle., MD;  Location: [facility] ENDOSCOPY;  Service: Gastroenterology;;   BILIARY STENT PLACEMENT N/A 02/26/2023   Procedure: BILIARY STENT PLACEMENT;  Surgeon: Wilhelmenia Aloha Raddle., MD;  Location: [facility];  Service: Gastroenterology;  Laterality: N/A;   BIOPSY  03/24/2022   Procedure: BIOPSY;  Surgeon: Wilhelmenia Aloha Raddle., MD;  Location: [facility] ENDOSCOPY;  Service: Gastroenterology;;   BIOPSY  11/23/2022   Procedure: BIOPSY;  Surgeon: Abran Norleen SAILOR, MD;  Location: [facility];  Service: Gastroenterology;;   BIOPSY  02/26/2023   Procedure: BIOPSY;  Surgeon: Wilhelmenia Aloha Raddle., MD;  Location: [facility];  Service: Gastroenterology;;   CESAREAN SECTION     x2   COLONOSCOPY N/A 11/23/2022   Procedure: COLONOSCOPY;  Surgeon: Abran Norleen SAILOR, MD;  Location: [facility];  Service: Gastroenterology;  Laterality: N/A;   CYSTOSCOPY WITH RETROGRADE PYELOGRAM, URETEROSCOPY AND STENT PLACEMENT Right 10/22/2020   Procedure: CYSTOSCOPY WITH RETROGRADE PYELOGRAM, URETEROSCOPY AND STENT PLACEMENT;  Surgeon: Elisabeth Valli BIRCH, MD;  Location: [facility];  Service: Urology;  Laterality: Right;   ENDOSCOPIC RETROGRADE CHOLANGIOPANCREATOGRAPHY (ERCP) WITH PROPOFOL  N/A 02/26/2023   Procedure: ENDOSCOPIC RETROGRADE CHOLANGIOPANCREATOGRAPHY (ERCP) WITH PROPOFOL ;  Surgeon: Wilhelmenia Aloha Raddle., MD;  Location: [facility];  Service: Gastroenterology;  Laterality:  N/A;   ERCP N/A 03/24/2022   Procedure: ENDOSCOPIC RETROGRADE CHOLANGIOPANCREATOGRAPHY (ERCP);  Surgeon: Wilhelmenia Aloha Raddle., MD;  Location[facility];  Service: Gastroenterology;  Laterality: N/A;   ESOPHAGOGASTRODUODENOSCOPY (EGD) WITH PROPOFOL  N/A 02/26/2023   Procedure: ESOPHAGOGASTRODUODENOSCOPY (EGD) WITH PROPOFOL ;  Surgeon: Wilhelmenia Aloha Raddle., MD;  Location: [facility];  Service: Gastroenterology;  Laterality: N/A;  patient needs EGD in addition to ERCP   IR PATIENT EVAL TECH 0-60 MINS  12/15/2022   IR PERC CHOLECYSTOSTOMY  03/11/2023   IR RADIOLOGIST EVAL & MGMT  09/16/2019   IR RADIOLOGIST EVAL & MGMT  09/23/2019   IR RADIOLOGIST EVAL & MGMT  09/20/2019   IR RADIOLOGIST EVAL & MGMT  09/30/2019   IR RADIOLOGIST EVAL & MGMT  04/20/2023   IR REMOVAL TUN ACCESS W/ PORT W/O FL MOD SED  09/14/2019   IR VENO/EXT/UNI RIGHT  04/11/2022   PANCREATIC STENT PLACEMENT  03/24/2022   Procedure: PANCREATIC STENT PLACEMENT;  Surgeon: Wilhelmenia Aloha Raddle., MD;  Location: [facility];  Service: Gastroenterology;;   REMOVAL OF STONES  03/24/2022   Procedure: REMOVAL OF STONES;  Surgeon: Wilhelmenia Aloha Raddle., MD;  Location: [facility] ENDOSCOPY;  Service: Gastroenterology;;   REMOVAL OF STONES  02/26/2023   Procedure: REMOVAL OF Sludge;  Surgeon: Wilhelmenia Aloha Raddle., MD;  Location: [facility];  Service: Gastroenterology;;   ANNETT  03/24/2022   Procedure: ANNETT;  Surgeon: Wilhelmenia Aloha Raddle., MD;  Location: [facility]COPY;  Service: Gastroenterology;;   CLEDA REMOVAL  02/26/2023   Procedure: STENT REMOVAL;  Surgeon: Wilhelmenia Aloha Raddle., MD;  Location: [facility];  Service: Gastroenterology;;   URETERAL STENT PLACEMENT      Social History:  reports that she has quit smoking. Her smoking use included cigarettes. She has never used smokeless tobacco. She reports that she does not currently use alcohol. She reports that she does not currently use drugs after having used the  following drugs: Marijuana.   Allergies  Allergen Reactions   Lisinopril Swelling and Other (See Comments)    Lip swelling and Angioedema   Irinotecan  Other (See Comments)    Stomach cramping Patient given 0.5 mL atropine  IV and was able to continue infusion.  See Progress note on 10/03/20.    Leucovorin  Calcium  Itching and Other (See Comments)    Patient reported itchy palms towards end of transfusion, see progress note 08/04/22.    Oxaliplatin  Itching and Other (See Comments)    Patient reported itchy palms towards end of transfusion, see progress note on 08/04/22.    Family History  Problem Relation Age of Onset   Hypertension Mother    Diabetes Mellitus II Father    Stroke Father    Ulcers Sister    Colon cancer Neg Hx    Esophageal cancer Neg Hx    Inflammatory bowel disease Neg Hx    Liver disease Neg Hx    Pancreatic cancer Neg Hx    Rectal cancer Neg Hx    Stomach cancer Neg Hx     Family history reviewed and not pertinent ***   Prior to Admission medications   Medication Sig Start Date End Date Taking? Authorizing Provider  acetaminophen  (TYLENOL ) 500 MG tablet Take 500 mg by mouth in the morning, at  noon, and at bedtime. Patient not taking: Reported on 09/30/2023    [provider]  amLODipine  (NORVASC ) 10 MG tablet Take 1 tablet (10 mg total) by mouth daily. 06/26/23   Pokhrel, Laxman, MD  cloNIDine  (CATAPRES ) 0.1 MG tablet Take 3 tablets (0.3 mg total) by mouth 3 (three) times daily. 06/09/23   Shahmehdi, Adriana LABOR, MD  dexamethasone  (DECADRON ) 4 MG tablet Take 1 tablet (4 mg total) by mouth daily. 09/26/23   Jadine Toribio SQUIBB, MD  dicyclomine  (BENTYL ) 20 MG tablet Take 1 tablet (20 mg total) by mouth 3 (three) times daily as needed for spasms (rectal/AB pain). 02/17/23   Cloretta Arley NOVAK, MD  famotidine  (PEPCID ) 20 MG tablet Take 1 tablet (20 mg total) once daily x 2 days prior to oxaliplatin  and daily x 2 days after oxaliplatin  dose. Also take Benadryl  25 mg  (OTC) daily x 2 days prior to oxaliplatin . 08/19/23   Debby Olam POUR, NP  fentaNYL  (DURAGESIC ) 75 MCG/HR Place 2 patches onto the skin every 3 days. 09/27/23   Jadine Toribio SQUIBB, MD  HYDROmorphone  (DILAUDID ) 8 MG tablet Take 1 tablet (8 mg total) by mouth every 4 (four) hours as needed for severe pain (pain score 7-10) or moderate pain (pain score 4-6). 09/25/23   Jadine Toribio SQUIBB, MD  lactulose  (CHRONULAC ) 10 GM/15ML solution Take 45 mLs (30 g total) by mouth daily. 09/26/23   Jadine Toribio SQUIBB, MD  metoprolol  tartrate (LOPRESSOR ) 100 MG tablet Take 1 tablet (100 mg total) by mouth 2 (two) times daily. 06/09/23 09/30/23  Shahmehdi, Seyed A, MD  polyethylene glycol powder (GLYCOLAX /MIRALAX ) 17 GM/SCOOP powder Take 17 g by mouth daily as needed. Patient not taking: Reported on 09/30/2023 06/09/23   Willette Adriana LABOR, MD  senna-docusate (SENOKOT-S) 8.6-50 MG tablet Take 3 tablets by mouth 2 (two) times daily. 09/25/23   Jadine Toribio SQUIBB, MD     Objective    Physical Exam: Vitals:   10/03/23 1615 10/03/23 1649 10/03/23 1830 10/03/23 1945  BP: (!) 116/101 (!) 127/99 (!) 141/102 (!) 166/112  Pulse:  85  87  Resp:  19 15 16   Temp:  98.5 F (36.9 C)  98.4 F (36.9 C)  TempSrc:  Oral    SpO2:  98%  98%    General: appears to be stated age; alert, oriented Skin: warm, dry, no rash Head:  AT/[place] Mouth:  Oral mucosa membranes appear moist, normal dentition Neck: supple; trachea midline Heart:  RRR; did not appreciate any M/R/G Lungs: CTAB, did not appreciate any wheezes, rales, or rhonchi Abdomen: + BS; soft, ND, NT Vascular: 2+ pedal pulses b/l; 2+ radial pulses b/l Extremities: no peripheral edema, no muscle wasting Neuro: strength and sensation intact in upper and lower extremities b/l    *** Neuro: 5/5 strength of the proximal and distal flexors and extensors of the upper and lower extremities bilaterally; sensation intact in upper and lower extremities b/l; cranial nerves II through  XII grossly intact; no pronator drift; no evidence suggestive of slurred speech, dysarthria, or facial droop; Normal muscle tone. No tremors. *** Neuro: In the setting of the patient's current mental status and associated inability to follow instructions, unable to perform full neurologic exam at this time.  As such, assessment of strength, sensation, and cranial nerves is limited at this time. Patient noted to spontaneously move all 4 extremities. No tremors.  ***    Labs on Admission: I have personally reviewed following labs and imaging studies  CBC: Recent  Labs  Lab 09/30/23 1340 10/03/23 1337  WBC 12.2* 8.4  NEUTROABS 8.0*  --   HGB 11.5* 10.8*  HCT 35.1* 33.9*  MCV 84.4 87.4  PLT 410* 303   Basic Metabolic Panel: Recent Labs  Lab 09/30/23 1340 10/03/23 1337  NA 133* 135  K 2.8* 3.5  CL 89* 93*  CO2 32 30  GLUCOSE 108* 96  BUN 7 11  CREATININE 0.42* 0.45  CALCIUM  9.9 9.7  MG 2.0  --    GFR: Estimated Creatinine Clearance: 69.6 mL/min (by C-G formula based on SCr of 0.45 mg/dL). Liver Function Tests: Recent Labs  Lab 09/30/23 1340 10/03/23 1337  AST 79* 118*  ALT 39 60*  ALKPHOS 732* 660*  BILITOT 0.7 0.8  PROT 9.5* 8.4*  ALBUMIN 3.7 3.4*   Recent Labs  Lab 10/03/23 1337  LIPASE 21   No results for input(s): AMMONIA in the last 168 hours. Coagulation Profile: No results for input(s): INR, PROTIME in the last 168 hours. Cardiac Enzymes: No results for input(s): CKTOTAL, CKMB, CKMBINDEX, TROPONINI in the last 168 hours. BNP (last 3 results) No results for input(s): PROBNP in the last 8760 hours. HbA1C: No results for input(s): HGBA1C in the last 72 hours. CBG: No results for input(s): GLUCAP in the last 168 hours. Lipid Profile: No results for input(s): CHOL, HDL, LDLCALC, TRIG, CHOLHDL, LDLDIRECT in the last 72 hours. Thyroid Function Tests: No results for input(s): TSH, T4TOTAL, FREET4, T3FREE,  THYROIDAB in the last 72 hours. Anemia Panel: No results for input(s): VITAMINB12, FOLATE, FERRITIN, TIBC, IRON, RETICCTPCT in the last 72 hours. Urine analysis:    Component Value Date/Time   COLORURINE YELLOW 09/20/2023 1807   APPEARANCEUR CLEAR 09/20/2023 1807   LABSPEC 1.028 09/20/2023 1807   PHURINE 6.0 09/20/2023 1807   GLUCOSEU NEGATIVE 09/20/2023 1807   HGBUR SMALL (A) 09/20/2023 1807   BILIRUBINUR NEGATIVE 09/20/2023 1807   KETONESUR 20 (A) 09/20/2023 1807   PROTEINUR 30 (A) 09/20/2023 1807   NITRITE NEGATIVE 09/20/2023 1807   LEUKOCYTESUR NEGATIVE 09/20/2023 1807    Radiological Exams on Admission: CT ABDOMEN PELVIS W CONTRAST Result Date: 10/03/2023 CLINICAL DATA:  Abdominal pain with nausea and vomiting. History of stage IV colon cancer. Last treatment 10/01/2023. EXAM: CT ABDOMEN AND PELVIS WITH CONTRAST TECHNIQUE: Multidetector CT imaging of the abdomen and pelvis was performed using the standard protocol following bolus administration of intravenous contrast. RADIATION DOSE REDUCTION: This exam was performed according to the departmental dose-optimization program which includes automated exposure control, adjustment of the mA and/or kV according to patient size and/or use of iterative reconstruction technique. CONTRAST:  OMNIPAQUE  IOHEXOL  300 MG/ML  SOLN COMPARISON:  09/20/2023, 08/02/2023 FINDINGS: Lower chest: Stable 6 mm nodule over the medial left lower lobe. Mild nodularity along the right diaphragmatic surface unchanged. Hepatobiliary: Numerous liver metastases most prominent over the right lobe with mild interval progression. Gallbladder unremarkable. Biliary stent unchanged. Stable dilatation of the central patent ducts. Pancreas: Normal. Spleen: Normal. Adrenals/Urinary Tract: Adrenal glands are normal. Kidneys are normal size with continued evidence bilateral nephrolithiasis as well as stable dilatation of the upper pole left intrarenal collecting  system. Stable left renal cyst. Stable dilatation of the left ureter. Right ureter and bladder are unremarkable. Stomach/Bowel: Stomach and small bowel is unremarkable. Appendix not well visualized. Essentially stable ill-defined soft tissue density over the rectosigmoid junction compatible with known colon cancer. Moderate fecal retention throughout the colon. Vascular/Lymphatic: Abdominal aorta is normal in caliber. Remaining vascular structures are unremarkable.  Stable mild adenopathy over the gastrohepatic ligament. No other significant adenopathy identified. Reproductive: Uterus difficult to visualize. Oval simple cystic collection in the left pelvis unchanged measuring 5.6 x 6.5 cm. Other: Minimal free fluid most notable over the left upper quadrant. No significant change in bilateral anterior abdominal omental caking. Musculoskeletal: Unchanged. IMPRESSION: 1. No acute findings in the abdomen/pelvis. 2. Essentially stable ill-defined soft tissue density over the rectosigmoid junction compatible with known colon cancer. Numerous liver metastases with mild interval progression. Stable peritoneal metastases/omental caking. Stable minimal adenopathy over the gastrohepatic ligament. 3. Stable bilateral nephrolithiasis with stable dilatation of the upper pole left intrarenal collecting system and left ureter. 4. Stable 6 mm nodule over the medial left lower lobe. Metastatic disease is possible as recommend attention on follow-up. 5. Stable 5.6 x 6.5 cm simple cystic collection in the left pelvis. Possible loculated fluid versus left ovarian cyst or cystic peritoneal metastasis. 6. Stable minimal free fluid most notable over the left upper quadrant. Electronically Signed   By: Toribio Agreste M.D.   On: 10/03/2023 18:11      Assessment/Plan    Principal Problem:   Intractable abdominal  pain  ***        ***                  ***                  ***                  ***                  ***                 ***                  ***                  ***                  ***                  ***                  ***                  ***                 ***     DVT prophylaxis: SCD's ***  Code Status: Full code*** Family Communication: none*** Disposition Plan: Per Rounding Team Consults called:   I have added her outpatient oncologist, Dr. Cloretta, to the treatment team starting at 7 AM on 10/04/2023.;  Admission status: ***    I SPENT GREATER THAN 75 *** MINUTES IN CLINICAL CARE TIME/MEDICAL DECISION-MAKING IN COMPLETING THIS ADMISSION.     Eva NOVAK Brittanee Ghazarian DO [facility] From 7PM - 7AM   10/03/2023, 8:09 PM   ***

## 2023-10-03 NOTE — Progress Notes (Signed)
 Pt presented to cancer center today for pump d/c, pt bp elevated and pt complaining of abdominal pain rating 9 out of 10. Pt states she has been unable to have a BM and also has had nausea and vomiting. Pt had about 5cc of chemo remaining in bag, pt request to not finish the remaining chemo, and requested to remove pump and take to ED for evaluation. Philippe, LPN discontinued pump and pt wheelchair over to the ED.

## 2023-10-03 NOTE — ED Triage Notes (Signed)
 Pt has c/o N/V, abdominal pain. Pt has hx of Stage 4 colon CA, last treatment was 9/18

## 2023-10-03 NOTE — ED Provider Notes (Signed)
 [place] EMERGENCY DEPARTMENT AT [facility] Provider Note   CSN: [id number] Arrival date & time: 10/03/23  1249     Patient presents with: Emesis and Abdominal Pain   April [name] is a 48 y.o. female.   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  Reports that she was unable to fully complete her chemotherapy today.  Reports she is having worsening abdominal pain despite compliance with her oral narcotic regimen.  Pain is consistent with previous but is worse than it ever has been before.  Did have a few episodes of nausea and vomiting today as well.  Denies any fevers or chills.  Does report that she is constipated, has not had a full bowel movement since she was admitted last week.  The history is provided by the patient. No language interpreter was used.  Emesis Associated symptoms: abdominal pain   Abdominal Pain Associated symptoms: nausea and vomiting        Prior to Admission medications   Medication Sig Start Date End Date Taking? Authorizing Provider  acetaminophen  (TYLENOL ) 500 MG tablet Take 500 mg by mouth in the morning, at noon, and at bedtime. Patient not taking: Reported on 09/30/2023    [provider]  amLODipine  (NORVASC ) 10 MG tablet Take 1 tablet (10 mg total) by mouth daily. 06/26/23   Pokhrel, Laxman, MD  cloNIDine  (CATAPRES ) 0.1 MG tablet Take 3 tablets (0.3 mg total) by mouth 3 (three) times daily. 06/09/23   Shahmehdi, Adriana LABOR, MD  dexamethasone  (DECADRON ) 4 MG tablet Take 1 tablet (4 mg total) by mouth daily. 09/26/23   Jadine Toribio SQUIBB, MD  dicyclomine  (BENTYL ) 20 MG tablet Take 1 tablet (20 mg total) by mouth 3 (three) times daily as needed for spasms (rectal/AB pain). 02/17/23   Cloretta Arley NOVAK, MD  famotidine  (PEPCID ) 20 MG tablet Take 1 tablet (20 mg total) once  daily x 2 days prior to oxaliplatin  and daily x 2 days after oxaliplatin  dose. Also take Benadryl  25 mg (OTC) daily x 2 days prior to oxaliplatin . 08/19/23   Debby Olam POUR, NP  fentaNYL  (DURAGESIC ) 75 MCG/HR Place 2 patches onto the skin every 3 days. 09/27/23   Jadine Toribio SQUIBB, MD  HYDROmorphone  (DILAUDID ) 8 MG tablet Take 1 tablet (8 mg total) by mouth every 4 (four) hours as needed for severe pain (pain score 7-10) or moderate pain (pain score 4-6). 09/25/23   Jadine Toribio SQUIBB, MD  lactulose  (CHRONULAC ) 10 GM/15ML solution Take 45 mLs (30 g total) by mouth daily. 09/26/23   Jadine Toribio SQUIBB, MD  metoprolol  tartrate (LOPRESSOR ) 100 MG tablet Take 1 tablet (100 mg total) by mouth 2 (two) times daily. 06/09/23 09/30/23  Willette Adriana LABOR, MD  polyethylene glycol powder (GLYCOLAX /MIRALAX ) 17 GM/SCOOP powder Take 17 g by mouth daily as needed. Patient not taking: Reported on 09/30/2023 06/09/23   Willette Adriana LABOR, MD  senna-docusate (SENOKOT-S) 8.6-50 MG tablet Take 3 tablets by mouth 2 (two) times daily. 09/25/23   Jadine Toribio SQUIBB, MD    Allergies: Lisinopril, Irinotecan , Leucovorin  calcium , and Oxaliplatin     Review of Systems  Gastrointestinal:  Positive for abdominal pain, nausea and vomiting.  All other systems reviewed and are negative.   Updated Vital Signs BP (!) 141/102   Pulse 85   Temp 98.5 F (36.9 C) (Oral)   Resp 15  LMP 01/07/2006   SpO2 98%   Physical Exam Vitals and nursing note reviewed.  Constitutional:      General: She is not in acute distress.    Appearance: Normal appearance. She is normal weight. She is not ill-appearing, toxic-appearing or diaphoretic.  HENT:     Head: Normocephalic and atraumatic.  Cardiovascular:     Rate and Rhythm: Normal rate.  Pulmonary:     Effort: Pulmonary effort is normal. No respiratory distress.  Abdominal:     Tenderness: There is abdominal tenderness. There is guarding and rebound.  Musculoskeletal:        General:  Normal range of motion.     Cervical back: Normal range of motion.  Skin:    General: Skin is warm and dry.  Neurological:     General: No focal deficit present.     Mental Status: She is alert.  Psychiatric:        Mood and Affect: Mood normal.        Behavior: Behavior normal.     (all labs ordered are listed, but only abnormal results are displayed) Labs Reviewed  COMPREHENSIVE METABOLIC PANEL WITH GFR - Abnormal; Notable for the following components:      Result Value   Chloride 93 (*)    Total Protein 8.4 (*)    Albumin 3.4 (*)    AST 118 (*)    ALT 60 (*)    Alkaline Phosphatase 660 (*)    All other components within normal limits  CBC - Abnormal; Notable for the following components:   Hemoglobin 10.8 (*)    HCT 33.9 (*)    RDW 17.2 (*)    All other components within normal limits  LIPASE, BLOOD  URINALYSIS, ROUTINE W REFLEX MICROSCOPIC    EKG: None  Radiology: CT ABDOMEN PELVIS W CONTRAST Result Date: 10/03/2023 CLINICAL DATA:  Abdominal pain with nausea and vomiting. History of stage IV colon cancer. Last treatment 10/01/2023. EXAM: CT ABDOMEN AND PELVIS WITH CONTRAST TECHNIQUE: Multidetector CT imaging of the abdomen and pelvis was performed using the standard protocol following bolus administration of intravenous contrast. RADIATION DOSE REDUCTION: This exam was performed according to the departmental dose-optimization program which includes automated exposure control, adjustment of the mA and/or kV according to patient size and/or use of iterative reconstruction technique. CONTRAST:  OMNIPAQUE  IOHEXOL  300 MG/ML  SOLN COMPARISON:  09/20/2023, 08/02/2023 FINDINGS: Lower chest: Stable 6 mm nodule over the medial left lower lobe. Mild nodularity along the right diaphragmatic surface unchanged. Hepatobiliary: Numerous liver metastases most prominent over the right lobe with mild interval progression. Gallbladder unremarkable. Biliary stent unchanged. Stable  dilatation of the central patent ducts. Pancreas: Normal. Spleen: Normal. Adrenals/Urinary Tract: Adrenal glands are normal. Kidneys are normal size with continued evidence bilateral nephrolithiasis as well as stable dilatation of the upper pole left intrarenal collecting system. Stable left renal cyst. Stable dilatation of the left ureter. Right ureter and bladder are unremarkable. Stomach/Bowel: Stomach and small bowel is unremarkable. Appendix not well visualized. Essentially stable ill-defined soft tissue density over the rectosigmoid junction compatible with known colon cancer. Moderate fecal retention throughout the colon. Vascular/Lymphatic: Abdominal aorta is normal in caliber. Remaining vascular structures are unremarkable. Stable mild adenopathy over the gastrohepatic ligament. No other significant adenopathy identified. Reproductive: Uterus difficult to visualize. Oval simple cystic collection in the left pelvis unchanged measuring 5.6 x 6.5 cm. Other: Minimal free fluid most notable over the left upper quadrant. No significant change in bilateral anterior  abdominal omental caking. Musculoskeletal: Unchanged. IMPRESSION: 1. No acute findings in the abdomen/pelvis. 2. Essentially stable ill-defined soft tissue density over the rectosigmoid junction compatible with known colon cancer. Numerous liver metastases with mild interval progression. Stable peritoneal metastases/omental caking. Stable minimal adenopathy over the gastrohepatic ligament. 3. Stable bilateral nephrolithiasis with stable dilatation of the upper pole left intrarenal collecting system and left ureter. 4. Stable 6 mm nodule over the medial left lower lobe. Metastatic disease is possible as recommend attention on follow-up. 5. Stable 5.6 x 6.5 cm simple cystic collection in the left pelvis. Possible loculated fluid versus left ovarian cyst or cystic peritoneal metastasis. 6. Stable minimal free fluid most notable over the left upper  quadrant. Electronically Signed   By: Toribio Agreste M.D.   On: 10/03/2023 18:11     Procedures   Medications Ordered in the ED  HYDROmorphone  (DILAUDID ) injection 2 mg (has no administration in time range)  HYDROmorphone  (DILAUDID ) injection 1 mg (1 mg Intravenous Given 10/03/23 1720)  iohexol  (OMNIPAQUE ) 300 MG/ML solution 100 mL (100 mLs Intravenous Contrast Given 10/03/23 1727)  ondansetron  (ZOFRAN ) injection 4 mg (4 mg Intravenous Given 10/03/23 1750)                                    Medical Decision Making Amount and/or Complexity of Data Reviewed Labs: ordered. Radiology: ordered.  Risk Prescription drug management.   This patient is a 48 y.o. female who presents to the ED for concern of abdominal pain, nausea, vomiting, this involves an extensive number of treatment options, and is a complaint that carries with it a high risk of complications and morbidity. The emergent differential diagnosis prior to evaluation includes, but is not limited to,  sequela of her known malignancy, AAA, gastroenteritis, appendicitis, Bowel obstruction, Bowel perforation. Gastroparesis, DKA, Hernia, Inflammatory bowel disease, mesenteric ischemia, pancreatitis, peritonitis SBP, volvulus.   This is not an exhaustive differential.   Past Medical History / Co-morbidities / Social History:  has a past medical history of Ascending aortic aneurysm (HCC) (12/16/2022), Benign essential hypertension (10/06/2017), Cancer (HCC), Change in bowel habits (06/28/2022), Colon cancer (HCC), History of colon cancer (06/28/2022), Hypertension, Prolonged QT interval (01/07/2018), Renal calculi, Sepsis due to undetermined organism (HCC) (12/24/2022), and Sialoadenitis of submandibular gland (12/16/2022).  Additional history: Chart reviewed. Pertinent results include: follows with Dr. Deanne with oncology for her colon cancer  Physical Exam: Physical exam performed. The pertinent findings include: tearful throughout  evaluation. Generalized abdominal tenderness throughout + rebound and guarding  Lab Tests: I ordered, and personally interpreted labs.  The pertinent results include:  no leukocytosis or neutropenia, hgb 10.8 down from 11.5 3 days ago but consistent with her baseline. Liver enzymes consistent with previous.    Imaging Studies: I ordered imaging studies including CT abdomen pelvis. I independently visualized and interpreted imaging which showed   1. No acute findings in the abdomen/pelvis. 2. Essentially stable ill-defined soft tissue density over the rectosigmoid junction compatible with known colon cancer. Numerous liver metastases with mild interval progression. Stable peritoneal metastases/omental caking. Stable minimal adenopathy over the gastrohepatic ligament. 3. Stable bilateral nephrolithiasis with stable dilatation of the upper pole left intrarenal collecting system and left ureter. 4. Stable 6 mm nodule over the medial left lower lobe. Metastatic disease is possible as recommend attention on follow-up. 5. Stable 5.6 x 6.5 cm simple cystic collection in the left pelvis. Possible loculated fluid versus left  ovarian cyst or cystic peritoneal metastasis. 6. Stable minimal free fluid most notable over the left upper quadrant.  I agree with the radiologist interpretation.   Cardiac Monitoring:  The patient was maintained on a cardiac monitor.  My attending physician viewed and interpreted the cardiac monitored which showed an underlying rhythm of: sinus rhythm, normal QTc. I agree with this interpretation.   Medications: I ordered medication including dilaudid , zofran   for pain, nausea. Reevaluation of the patient after these medicines showed that the patient improved. I have reviewed the patients home medicines and have made adjustments as needed.   Disposition: After consideration of the diagnostic results and the patients response to treatment, I feel that patient will require  admission for intractable abdominal pain due to her malignancy.  After above intervention, patient is feeling some better, however reports her pain is still significant and is requesting admission which is reasonable.  Discussed patient with hospitalist Dr. Kayla who accepts patient for admission.  Final diagnoses:  Pain of metastatic malignancy  Intractable abdominal pain    ED Discharge Orders     None          April [name] 10/03/23 2021    Armenta Canning, MD 10/04/23 1729

## 2023-10-04 ENCOUNTER — Encounter (HOSPITAL_COMMUNITY): Payer: Self-pay | Admitting: Internal Medicine

## 2023-10-04 DIAGNOSIS — Z8679 Personal history of other diseases of the circulatory system: Secondary | ICD-10-CM

## 2023-10-04 DIAGNOSIS — D630 Anemia in neoplastic disease: Secondary | ICD-10-CM | POA: Diagnosis present

## 2023-10-04 DIAGNOSIS — C786 Secondary malignant neoplasm of retroperitoneum and peritoneum: Secondary | ICD-10-CM | POA: Diagnosis present

## 2023-10-04 DIAGNOSIS — Z66 Do not resuscitate: Secondary | ICD-10-CM | POA: Diagnosis not present

## 2023-10-04 DIAGNOSIS — C189 Malignant neoplasm of colon, unspecified: Secondary | ICD-10-CM | POA: Diagnosis not present

## 2023-10-04 DIAGNOSIS — C787 Secondary malignant neoplasm of liver and intrahepatic bile duct: Secondary | ICD-10-CM | POA: Diagnosis present

## 2023-10-04 DIAGNOSIS — I7121 Aneurysm of the ascending aorta, without rupture: Secondary | ICD-10-CM | POA: Diagnosis present

## 2023-10-04 DIAGNOSIS — R112 Nausea with vomiting, unspecified: Secondary | ICD-10-CM | POA: Diagnosis present

## 2023-10-04 DIAGNOSIS — I10 Essential (primary) hypertension: Secondary | ICD-10-CM | POA: Diagnosis present

## 2023-10-04 DIAGNOSIS — G893 Neoplasm related pain (acute) (chronic): Secondary | ICD-10-CM | POA: Diagnosis present

## 2023-10-04 DIAGNOSIS — Z8249 Family history of ischemic heart disease and other diseases of the circulatory system: Secondary | ICD-10-CM | POA: Diagnosis not present

## 2023-10-04 DIAGNOSIS — Z888 Allergy status to other drugs, medicaments and biological substances status: Secondary | ICD-10-CM | POA: Diagnosis not present

## 2023-10-04 DIAGNOSIS — Z87891 Personal history of nicotine dependence: Secondary | ICD-10-CM | POA: Diagnosis not present

## 2023-10-04 DIAGNOSIS — E860 Dehydration: Secondary | ICD-10-CM | POA: Diagnosis present

## 2023-10-04 DIAGNOSIS — N19 Unspecified kidney failure: Secondary | ICD-10-CM | POA: Diagnosis not present

## 2023-10-04 DIAGNOSIS — Z9221 Personal history of antineoplastic chemotherapy: Secondary | ICD-10-CM | POA: Diagnosis not present

## 2023-10-04 DIAGNOSIS — E43 Unspecified severe protein-calorie malnutrition: Secondary | ICD-10-CM | POA: Diagnosis present

## 2023-10-04 DIAGNOSIS — K567 Ileus, unspecified: Secondary | ICD-10-CM | POA: Diagnosis present

## 2023-10-04 DIAGNOSIS — I160 Hypertensive urgency: Secondary | ICD-10-CM | POA: Diagnosis present

## 2023-10-04 DIAGNOSIS — F419 Anxiety disorder, unspecified: Secondary | ICD-10-CM | POA: Diagnosis present

## 2023-10-04 DIAGNOSIS — R109 Unspecified abdominal pain: Secondary | ICD-10-CM | POA: Diagnosis present

## 2023-10-04 DIAGNOSIS — J439 Emphysema, unspecified: Secondary | ICD-10-CM | POA: Diagnosis present

## 2023-10-04 DIAGNOSIS — R64 Cachexia: Secondary | ICD-10-CM | POA: Diagnosis present

## 2023-10-04 DIAGNOSIS — Z515 Encounter for palliative care: Secondary | ICD-10-CM | POA: Diagnosis not present

## 2023-10-04 DIAGNOSIS — C19 Malignant neoplasm of rectosigmoid junction: Secondary | ICD-10-CM | POA: Diagnosis present

## 2023-10-04 DIAGNOSIS — E876 Hypokalemia: Secondary | ICD-10-CM | POA: Diagnosis present

## 2023-10-04 DIAGNOSIS — D638 Anemia in other chronic diseases classified elsewhere: Secondary | ICD-10-CM | POA: Diagnosis present

## 2023-10-04 DIAGNOSIS — Z833 Family history of diabetes mellitus: Secondary | ICD-10-CM | POA: Diagnosis not present

## 2023-10-04 DIAGNOSIS — K5900 Constipation, unspecified: Secondary | ICD-10-CM | POA: Diagnosis present

## 2023-10-04 LAB — CBC WITH DIFFERENTIAL/PLATELET
Abs Immature Granulocytes: 0.03 K/uL (ref 0.00–0.07)
Basophils Absolute: 0 K/uL (ref 0.0–0.1)
Basophils Relative: 0 %
Eosinophils Absolute: 0.1 K/uL (ref 0.0–0.5)
Eosinophils Relative: 2 %
HCT: 31.3 % — ABNORMAL LOW (ref 36.0–46.0)
Hemoglobin: 9.7 g/dL — ABNORMAL LOW (ref 12.0–15.0)
Immature Granulocytes: 0 %
Lymphocytes Relative: 15 %
Lymphs Abs: 1 K/uL (ref 0.7–4.0)
MCH: 27.2 pg (ref 26.0–34.0)
MCHC: 31 g/dL (ref 30.0–36.0)
MCV: 87.7 fL (ref 80.0–100.0)
Monocytes Absolute: 0.2 K/uL (ref 0.1–1.0)
Monocytes Relative: 3 %
Neutro Abs: 5.6 K/uL (ref 1.7–7.7)
Neutrophils Relative %: 80 %
Platelets: 272 K/uL (ref 150–400)
RBC: 3.57 MIL/uL — ABNORMAL LOW (ref 3.87–5.11)
RDW: 17.1 % — ABNORMAL HIGH (ref 11.5–15.5)
WBC: 6.9 K/uL (ref 4.0–10.5)
nRBC: 0 % (ref 0.0–0.2)

## 2023-10-04 LAB — COMPREHENSIVE METABOLIC PANEL WITH GFR
ALT: 75 U/L — ABNORMAL HIGH (ref 0–44)
AST: 149 U/L — ABNORMAL HIGH (ref 15–41)
Albumin: 3.1 g/dL — ABNORMAL LOW (ref 3.5–5.0)
Alkaline Phosphatase: 617 U/L — ABNORMAL HIGH (ref 38–126)
Anion gap: 9 (ref 5–15)
BUN: 8 mg/dL (ref 6–20)
CO2: 31 mmol/L (ref 22–32)
Calcium: 8.8 mg/dL — ABNORMAL LOW (ref 8.9–10.3)
Chloride: 95 mmol/L — ABNORMAL LOW (ref 98–111)
Creatinine, Ser: 0.47 mg/dL (ref 0.44–1.00)
GFR, Estimated: >60 mL/min (ref >60)
Glucose, Bld: 82 mg/dL (ref 70–99)
Potassium: 3.4 mmol/L — ABNORMAL LOW (ref 3.5–5.1)
Sodium: 135 mmol/L (ref 135–145)
Total Bilirubin: 1.2 mg/dL (ref 0.0–1.2)
Total Protein: 7.2 g/dL (ref 6.5–8.1)

## 2023-10-04 LAB — MAGNESIUM: Magnesium: 2.1 mg/dL (ref 1.7–2.4)

## 2023-10-04 MED ORDER — CLONIDINE HCL 0.2 MG PO TABS
0.3000 mg | ORAL_TABLET | Freq: Three times a day (TID) | ORAL | Status: DC
Start: 2023-10-04 — End: 2023-10-06
  Administered 2023-10-04 – 2023-10-05 (×3): 0.3 mg via ORAL
  Filled 2023-10-04 (×3): qty 1

## 2023-10-04 MED ORDER — HYDRALAZINE HCL 25 MG PO TABS: 25.0000 mg | ORAL_TABLET | Freq: Four times a day (QID) | ORAL | Status: DC | PRN

## 2023-10-04 MED ORDER — DIPHENHYDRAMINE HCL 50 MG/ML IJ SOLN: 12.5000 mg | Freq: Four times a day (QID) | INTRAMUSCULAR | Status: DC | PRN

## 2023-10-04 MED ORDER — DIPHENHYDRAMINE HCL 12.5 MG/5ML PO ELIX: 12.5000 mg | ORAL_SOLUTION | Freq: Four times a day (QID) | ORAL | Status: DC | PRN

## 2023-10-04 MED ORDER — HYDROMORPHONE 1 MG/ML IV SOLN
INTRAVENOUS | Status: DC
  Administered 2023-10-04: 12 mg via INTRAVENOUS
  Administered 2023-10-04 (×2): 30 mg via INTRAVENOUS
  Administered 2023-10-05: 2.79 mg via INTRAVENOUS
  Filled 2023-10-04: qty 30

## 2023-10-04 MED ORDER — NALOXONE HCL 0.4 MG/ML IJ SOLN: 0.4000 mg | INTRAMUSCULAR | Status: DC | PRN

## 2023-10-04 MED ORDER — DIPHENHYDRAMINE HCL 50 MG/ML IJ SOLN
12.5000 mg | Freq: Four times a day (QID) | INTRAMUSCULAR | Status: DC | PRN
  Administered 2023-10-06: 12.5 mg via INTRAVENOUS
  Filled 2023-10-04: qty 1

## 2023-10-04 MED ORDER — AMLODIPINE BESYLATE 10 MG PO TABS
10.0000 mg | ORAL_TABLET | Freq: Every day | ORAL | Status: DC
Start: 2023-10-04 — End: 2023-10-06
  Administered 2023-10-04 – 2023-10-05 (×2): 10 mg via ORAL
  Filled 2023-10-04 (×2): qty 1

## 2023-10-04 MED ORDER — SODIUM CHLORIDE 0.9% FLUSH: 9.0000 mL | INTRAVENOUS | Status: DC | PRN

## 2023-10-04 MED ORDER — DEXAMETHASONE 4 MG PO TABS
4.0000 mg | ORAL_TABLET | Freq: Every day | ORAL | Status: DC
Start: 2023-10-04 — End: 2023-10-05
  Administered 2023-10-04 – 2023-10-05 (×2): 4 mg via ORAL
  Filled 2023-10-04 (×2): qty 1

## 2023-10-04 MED ORDER — FENTANYL 75 MCG/HR TD PT72
1.0000 | MEDICATED_PATCH | TRANSDERMAL | Status: DC
Start: 2023-10-06 — End: 2023-10-05

## 2023-10-04 MED ORDER — FENTANYL 75 MCG/HR TD PT72: 1.0000 | MEDICATED_PATCH | TRANSDERMAL | Status: DC

## 2023-10-04 MED ORDER — OXYCODONE HCL ER 10 MG PO T12A
10.0000 mg | EXTENDED_RELEASE_TABLET | Freq: Two times a day (BID) | ORAL | Status: DC
  Administered 2023-10-04 – 2023-10-05 (×2): 10 mg via ORAL
  Filled 2023-10-04 (×2): qty 1

## 2023-10-04 MED ORDER — METOPROLOL TARTRATE 25 MG PO TABS
100.0000 mg | ORAL_TABLET | Freq: Two times a day (BID) | ORAL | Status: DC
  Administered 2023-10-04 – 2023-10-05 (×3): 100 mg via ORAL
  Filled 2023-10-04 (×3): qty 2

## 2023-10-04 MED ORDER — LACTULOSE 10 GM/15ML PO SOLN
20.0000 g | Freq: Every day | ORAL | Status: DC
  Administered 2023-10-04 – 2023-10-05 (×2): 20 g via ORAL
  Filled 2023-10-04 (×3): qty 30

## 2023-10-04 MED ORDER — HYDROMORPHONE 1 MG/ML IV SOLN
INTRAVENOUS | Status: DC
  Administered 2023-10-04: 3.5 mg via INTRAVENOUS

## 2023-10-04 MED ORDER — POTASSIUM CHLORIDE CRYS ER 20 MEQ PO TBCR
40.0000 meq | EXTENDED_RELEASE_TABLET | ORAL | Status: AC
Start: 2023-10-04 — End: 2023-10-05
  Filled 2023-10-04: qty 2

## 2023-10-04 NOTE — Progress Notes (Addendum)
 This RN has been reaching out to Leatrice Chapel MD since 953 to obtain pain medicine and lactulose  for patient. MD read 4 messages over a 2 hour period and never responded. [employer] on board and this RN contacted the [employer]. MD responded and asked this RN if the PCA dose could be changed. This RN told MD that was up to him not me. He then told this RN that orders were put in for PCA and that has been over an hour ago and no orders have been put in. This RN reached back out twice to let MD know no orders were put in. Patient is very upset and feels that no one cares about her or her pain. This RN contacted Angeline Hales RN as well about this situation. As of this time patient is still in 9/10 pain with no orders or relief. This RN will continue to monitor and advocate for patient.

## 2023-10-04 NOTE — Progress Notes (Addendum)
 This RN removed 2 - 75 mcg fentanyl  patches from patients BUE at time of admission. Patches wasted and witness by second [employer] fundraiser.

## 2023-10-04 NOTE — Progress Notes (Signed)
 PROGRESS NOTE    April [name]  FMW:[id number] DOB: 11-01-1975 DOA: 10/03/2023 PCP: Oris Camie BRAVO, NP  Outpatient Specialists:     Brief Narrative:  As per H&P done on admission: April [name] is a 48 y.o. female with medical history significant for stage IV colon cancer on chemotherapy, essential hypertension, anemia of chronic disease with baseline hemoglobin 9-12, who is admitted to [facility] on 10/03/2023 with intractable abdominal pain after presenting from home to [facility] complaining of abdominal pain.    The patient reports 2 days of progressive generalized abdominal discomfort, sharp, nonradiating, worse with palpation.  She notes that she has been unable to achieve adequate pain control as an outpatient on her existing analgesic regimen which includes fentanyl  patches x 2 every 72 hours as well as as needed p.o. Dilaudid .  She continues to produce flatus, denies any recent melena or hematochezia.  She also notes recurrent nausea over the last 2 days, resulting in 2-3 daily episodes of nonbloody, nonbilious emesis over that time, further complicating her ability to pursue oral analgesic intervention.   Denies any associated chest pain or shortness of breath.   She has a history of stage IV colon cancer, and reportedly most recently went underwent chemotherapy on 10/01/2023.  Follows with Dr. Cloretta As her outpatient oncologist   The patient conveys that she has previously been hospitalized for similar intractable abdominal discomfort, and that Dilaudid  PCA has been effective in achieving improvement in pain control at this times.       ED Course:  Vital signs in the ED were notable for the following: Afebrile; heart rates in the 80s to 90s; systolic pressures in the 120s 160s; respiratory rate 15-19, oxygen saturation 97 to 100% on room air.   Labs were notable for the following: CMP was notable for the following: Sodium 135, Khary 30, creatinine  0.45, BUN DeGroote ratio 24.4, glucose 96, alkaline phosphatase 660 compared to 732 on 09/30/2023, total bilirubin 0.8, AST 118 compared to 79 on 09/30/2023, ALT 60 compared to 39 on 09/30/2023.  Lipase 21.  CBC notable for white blood cell count 8400, hemoglobin 10.8 compared to 11.5 on 09/30/2023, platelet count 303.  Urinalysis showed no white blood cells, leukocyte esterase/nitrate negative as well as specific gravity greater than 1.046.   Per my interpretation, EKG in ED demonstrated the following: Sinus rhythm with heart rate 90, normal intervals, no evidence of T wave or ST changes, including no evidence of ST elevation.   Imaging in the ED, per corresponding formal radiology read, was notable for the following: CT abdomen/pelvis with contrast showed no evidence of acute intra-abdominal or acute intrapelvic process, including no evidence of bowel obstruction, perforation, or abscess.   While in the ED, the following were administered: Dilaudid  1 mg IV x 1 dose, Dilaudid  2 mg IV x 1 dose, Zofran  4 mg IV x 1 dose.   Subsequently, the patient was admitted for further evaluation management of presenting intractable abdominal discomfort as well as recurrent nausea/vomiting, with with clinical evidence of dehydration.   10/04/2023: Patient seen alongside patient's nurse.  Patient continues to report 8 out of 10 pain, however, patient remains stable, with no hemodynamic instability.  Heart rate is controlled.  No facial grimaces.  Patient is currently on PCA pump.  Will adjust PCA pump very mildly.  Will start patient on oxycodone  ER 10 mg p.o. twice daily.  The goal is to transition patient to oral medications.  Will consult palliative  care medicine.  Assessment & Plan:   Principal Problem:   Intractable abdominal pain Active Problems:   Colorectal cancer, stage IV (HCC)   Nausea & vomiting   Dehydration   Acute prerenal azotemia   History of essential hypertension   Anemia of chronic  disease   Intractable abdominal pain: - History of stage IV colon cancer noted. - Continue PCA pump. - Start oxycodone  ER 10 mg p.o. twice daily. - Palliative care consult.  Hypertension: -Slowly improving. - Continue to monitor closely. - Resume home antihypertensives.  Hypokalemia: - Potassium of 3.4. - Monitor and replete. - Magnesium  is 2.1.  Abnormal LFTs: - Elevated AST and ALT (AST of 149 and ALT of 75). - Elevated alkaline phosphatase (617).: - History of stage IV colon cancer.   DVT prophylaxis: SCD. Code Status: Full code Family Communication:  Disposition Plan: This will depend on hospital course   Consultants:  Low threshold to consult the palliative care team  Procedures:  None  Antimicrobials:  None   Subjective: -Reports abdominal pain, however, looks stable.  Objective: Vitals:   10/04/23 1820 10/04/23 1823 10/04/23 1827 10/04/23 2011  BP:   (!) 164/122 (!) 132/100  Pulse:   73 85  Resp: 16 16  17   Temp:    98.7 F (37.1 C)  TempSrc:    Oral  SpO2:   100% 100%  Weight:      Height:        Intake/Output Summary (Last 24 hours) at 10/04/2023 2017 Last data filed at 10/04/2023 1200 Gross per 24 hour  Intake 1432.9 ml  Output --  Net 1432.9 ml   Filed Weights   10/03/23 2300 10/04/23 0435  Weight: 53.4 kg 53.4 kg    Examination:  General exam: Appears calm and comfortable. Respiratory system: Clear to auscultation. Respiratory effort normal. Cardiovascular system: S1 & S2 heard Gastrointestinal system: Abdomen is nondistended.  Abdominal tenderness. Central nervous system: Alert and oriented.  Moves all extremities.   Extremities: No leg edema.  Data Reviewed: I have personally reviewed following labs and imaging studies  CBC: Recent Labs  Lab 09/30/23 1340 10/03/23 1337 10/04/23 0401  WBC 12.2* 8.4 6.9  NEUTROABS 8.0*  --  5.6  HGB 11.5* 10.8* 9.7*  HCT 35.1* 33.9* 31.3*  MCV 84.4 87.4 87.7  PLT 410* 303 272    Basic Metabolic Panel: Recent Labs  Lab 09/30/23 1340 10/03/23 1337 10/04/23 0401  NA 133* 135 135  K 2.8* 3.5 3.4*  CL 89* 93* 95*  CO2 32 30 31  GLUCOSE 108* 96 82  BUN 7 11 8   CREATININE 0.42* 0.45 0.47  CALCIUM  9.9 9.7 8.8*  MG 2.0 2.3 2.1   GFR: Estimated Creatinine Clearance: 71.7 mL/min (by C-G formula based on SCr of 0.47 mg/dL). Liver Function Tests: Recent Labs  Lab 09/30/23 1340 10/03/23 1337 10/04/23 0401  AST 79* 118* 149*  ALT 39 60* 75*  ALKPHOS 732* 660* 617*  BILITOT 0.7 0.8 1.2  PROT 9.5* 8.4* 7.2  ALBUMIN 3.7 3.4* 3.1*   Recent Labs  Lab 10/03/23 1337  LIPASE 21   No results for input(s): AMMONIA in the last 168 hours. Coagulation Profile: No results for input(s): INR, PROTIME in the last 168 hours. Cardiac Enzymes: No results for input(s): CKTOTAL, CKMB, CKMBINDEX, TROPONINI in the last 168 hours. BNP (last 3 results) No results for input(s): PROBNP in the last 8760 hours. HbA1C: No results for input(s): HGBA1C in the last 72 hours. CBG:  No results for input(s): GLUCAP in the last 168 hours. Lipid Profile: No results for input(s): CHOL, HDL, LDLCALC, TRIG, CHOLHDL, LDLDIRECT in the last 72 hours. Thyroid Function Tests: No results for input(s): TSH, T4TOTAL, FREET4, T3FREE, THYROIDAB in the last 72 hours. Anemia Panel: No results for input(s): VITAMINB12, FOLATE, FERRITIN, TIBC, IRON, RETICCTPCT in the last 72 hours. Urine analysis:    Component Value Date/Time   COLORURINE YELLOW 10/03/2023 2142   APPEARANCEUR CLEAR 10/03/2023 2142   LABSPEC >1.046 (H) 10/03/2023 2142   PHURINE 6.0 10/03/2023 2142   GLUCOSEU NEGATIVE 10/03/2023 2142   HGBUR NEGATIVE 10/03/2023 2142   BILIRUBINUR NEGATIVE 10/03/2023 2142   KETONESUR NEGATIVE 10/03/2023 2142   PROTEINUR NEGATIVE 10/03/2023 2142   NITRITE NEGATIVE 10/03/2023 2142   LEUKOCYTESUR NEGATIVE 10/03/2023 2142   Sepsis  Labs: @LABRCNTIP (procalcitonin:4,lacticidven:4)  )No results found for this or any previous visit (from the past 240 hours).       Radiology Studies: CT ABDOMEN PELVIS W CONTRAST Result Date: 10/03/2023 CLINICAL DATA:  Abdominal pain with nausea and vomiting. History of stage IV colon cancer. Last treatment 10/01/2023. EXAM: CT ABDOMEN AND PELVIS WITH CONTRAST TECHNIQUE: Multidetector CT imaging of the abdomen and pelvis was performed using the standard protocol following bolus administration of intravenous contrast. RADIATION DOSE REDUCTION: This exam was performed according to the departmental dose-optimization program which includes automated exposure control, adjustment of the mA and/or kV according to patient size and/or use of iterative reconstruction technique. CONTRAST:  OMNIPAQUE  IOHEXOL  300 MG/ML  SOLN COMPARISON:  09/20/2023, 08/02/2023 FINDINGS: Lower chest: Stable 6 mm nodule over the medial left lower lobe. Mild nodularity along the right diaphragmatic surface unchanged. Hepatobiliary: Numerous liver metastases most prominent over the right lobe with mild interval progression. Gallbladder unremarkable. Biliary stent unchanged. Stable dilatation of the central patent ducts. Pancreas: Normal. Spleen: Normal. Adrenals/Urinary Tract: Adrenal glands are normal. Kidneys are normal size with continued evidence bilateral nephrolithiasis as well as stable dilatation of the upper pole left intrarenal collecting system. Stable left renal cyst. Stable dilatation of the left ureter. Right ureter and bladder are unremarkable. Stomach/Bowel: Stomach and small bowel is unremarkable. Appendix not well visualized. Essentially stable ill-defined soft tissue density over the rectosigmoid junction compatible with known colon cancer. Moderate fecal retention throughout the colon. Vascular/Lymphatic: Abdominal aorta is normal in caliber. Remaining vascular structures are unremarkable. Stable mild adenopathy  over the gastrohepatic ligament. No other significant adenopathy identified. Reproductive: Uterus difficult to visualize. Oval simple cystic collection in the left pelvis unchanged measuring 5.6 x 6.5 cm. Other: Minimal free fluid most notable over the left upper quadrant. No significant change in bilateral anterior abdominal omental caking. Musculoskeletal: Unchanged. IMPRESSION: 1. No acute findings in the abdomen/pelvis. 2. Essentially stable ill-defined soft tissue density over the rectosigmoid junction compatible with known colon cancer. Numerous liver metastases with mild interval progression. Stable peritoneal metastases/omental caking. Stable minimal adenopathy over the gastrohepatic ligament. 3. Stable bilateral nephrolithiasis with stable dilatation of the upper pole left intrarenal collecting system and left ureter. 4. Stable 6 mm nodule over the medial left lower lobe. Metastatic disease is possible as recommend attention on follow-up. 5. Stable 5.6 x 6.5 cm simple cystic collection in the left pelvis. Possible loculated fluid versus left ovarian cyst or cystic peritoneal metastasis. 6. Stable minimal free fluid most notable over the left upper quadrant. Electronically Signed   By: Toribio Agreste M.D.   On: 10/03/2023 18:11        Scheduled Meds:  amLODipine   10 mg Oral Daily   Chlorhexidine  Gluconate Cloth  6 each Topical Daily   cloNIDine   0.3 mg Oral TID   dexamethasone   4 mg Oral Daily   feeding supplement  237 mL Oral BID BM   [START ON 10/06/2023] fentaNYL   1 patch Transdermal Q72H   And   [START ON 10/06/2023] fentaNYL   1 patch Transdermal Q72H   HYDROmorphone    Intravenous Q4H   lactulose   20 g Oral Daily   metoprolol  tartrate  100 mg Oral BID   oxyCODONE   10 mg Oral Q12H   sodium chloride  flush  10-40 mL Intracatheter Q12H   Continuous Infusions:   LOS: 0 days    Time spent: 35 minutes.    Leatrice Chapel, MD  [facility] Pager #: [phone] 7PM-7AM  contact night coverage as above

## 2023-10-04 NOTE — Plan of Care (Signed)
  Problem: Education: Goal: Knowledge of General Education information will improve Description: Including pain rating scale, medication(s)/side effects and non-pharmacologic comfort measures Outcome: Progressing   Problem: Health Behavior/Discharge Planning: Goal: Ability to manage health-related needs will improve Outcome: Progressing   Problem: Clinical Measurements: Goal: Ability to maintain clinical measurements within normal limits will improve Outcome: Progressing Goal: Will remain free from infection Outcome: Progressing Goal: Diagnostic test results will improve Outcome: Progressing Goal: Respiratory complications will improve Outcome: Progressing Goal: Cardiovascular complication will be avoided Outcome: Progressing   Problem: Activity: Goal: Risk for activity intolerance will decrease Outcome: Progressing   Problem: Nutrition: Goal: Adequate nutrition will be maintained Outcome: Progressing   Problem: Pain Managment: Goal: General experience of comfort will improve and/or be controlled Outcome: Progressing   Problem: Safety: Goal: Ability to remain free from injury will improve Outcome: Progressing

## 2023-10-04 NOTE — Plan of Care (Signed)

## 2023-10-05 ENCOUNTER — Inpatient Hospital Stay (HOSPITAL_COMMUNITY)

## 2023-10-05 ENCOUNTER — Inpatient Hospital Stay: Admitting: Medical

## 2023-10-05 DIAGNOSIS — Z515 Encounter for palliative care: Secondary | ICD-10-CM | POA: Diagnosis not present

## 2023-10-05 DIAGNOSIS — C19 Malignant neoplasm of rectosigmoid junction: Secondary | ICD-10-CM | POA: Diagnosis not present

## 2023-10-05 DIAGNOSIS — G893 Neoplasm related pain (acute) (chronic): Secondary | ICD-10-CM | POA: Diagnosis not present

## 2023-10-05 DIAGNOSIS — R109 Unspecified abdominal pain: Secondary | ICD-10-CM | POA: Diagnosis not present

## 2023-10-05 LAB — CBC WITH DIFFERENTIAL/PLATELET
Abs Immature Granulocytes: 0.02 K/uL (ref 0.00–0.07)
Basophils Absolute: 0 K/uL (ref 0.0–0.1)
Basophils Relative: 0 %
Eosinophils Absolute: 0.1 K/uL (ref 0.0–0.5)
Eosinophils Relative: 1 %
HCT: 29.9 % — ABNORMAL LOW (ref 36.0–46.0)
Hemoglobin: 9.2 g/dL — ABNORMAL LOW (ref 12.0–15.0)
Immature Granulocytes: 0 %
Lymphocytes Relative: 23 %
Lymphs Abs: 1.6 K/uL (ref 0.7–4.0)
MCH: 27.1 pg (ref 26.0–34.0)
MCHC: 30.8 g/dL (ref 30.0–36.0)
MCV: 88.2 fL (ref 80.0–100.0)
Monocytes Absolute: 0.2 K/uL (ref 0.1–1.0)
Monocytes Relative: 2 %
Neutro Abs: 5.1 K/uL (ref 1.7–7.7)
Neutrophils Relative %: 74 %
Platelets: 257 K/uL (ref 150–400)
RBC: 3.39 MIL/uL — ABNORMAL LOW (ref 3.87–5.11)
RDW: 17 % — ABNORMAL HIGH (ref 11.5–15.5)
WBC: 7.1 K/uL (ref 4.0–10.5)
nRBC: 0 % (ref 0.0–0.2)

## 2023-10-05 LAB — TROPONIN T, HIGH SENSITIVITY
Troponin T High Sensitivity: <15 ng/L (ref 0–19)
Troponin T High Sensitivity: <15 ng/L (ref 0–19)

## 2023-10-05 LAB — RENAL FUNCTION PANEL
Albumin: 3.1 g/dL — ABNORMAL LOW (ref 3.5–5.0)
Anion gap: 10 (ref 5–15)
BUN: 8 mg/dL (ref 6–20)
CO2: 28 mmol/L (ref 22–32)
Calcium: 8.4 mg/dL — ABNORMAL LOW (ref 8.9–10.3)
Chloride: 96 mmol/L — ABNORMAL LOW (ref 98–111)
Creatinine, Ser: 0.48 mg/dL (ref 0.44–1.00)
GFR, Estimated: >60 mL/min (ref >60)
Glucose, Bld: 89 mg/dL (ref 70–99)
Phosphorus: 2.7 mg/dL (ref 2.5–4.6)
Potassium: 3.2 mmol/L — ABNORMAL LOW (ref 3.5–5.1)
Sodium: 135 mmol/L (ref 135–145)

## 2023-10-05 LAB — MAGNESIUM: Magnesium: 1.8 mg/dL (ref 1.7–2.4)

## 2023-10-05 MED ORDER — SIMETHICONE 80 MG PO CHEW
80.0000 mg | CHEWABLE_TABLET | Freq: Once | ORAL | Status: AC
  Administered 2023-10-05: 80 mg via ORAL
  Filled 2023-10-05: qty 1

## 2023-10-05 MED ORDER — SODIUM CHLORIDE 0.9 % IV SOLN
50.0000 ug/h | INTRAVENOUS | Status: DC
  Administered 2023-10-05 – 2023-10-09 (×8): 50 ug/h via INTRAVENOUS
  Filled 2023-10-05 (×12): qty 1

## 2023-10-05 MED ORDER — HYDROMORPHONE HCL 2 MG/ML IJ SOLN: 4.0000 mg | INTRAMUSCULAR | Status: DC | PRN

## 2023-10-05 MED ORDER — METHYLNALTREXONE BROMIDE 12 MG/0.6ML ~~LOC~~ SOLN
8.0000 mg | SUBCUTANEOUS | Status: AC
  Administered 2023-10-05: 8 mg via SUBCUTANEOUS
  Filled 2023-10-05 (×3): qty 0.6

## 2023-10-05 MED ORDER — HYDROMORPHONE HCL 4 MG PO TABS: 8.0000 mg | ORAL_TABLET | ORAL | Status: DC | PRN

## 2023-10-05 MED ORDER — FENTANYL 75 MCG/HR TD PT72
1.0000 | MEDICATED_PATCH | TRANSDERMAL | Status: DC
Start: 2023-10-06 — End: 2023-10-05

## 2023-10-05 MED ORDER — SENNOSIDES-DOCUSATE SODIUM 8.6-50 MG PO TABS: 1.0000 | ORAL_TABLET | Freq: Two times a day (BID) | ORAL | Status: DC

## 2023-10-05 MED ORDER — POTASSIUM CHLORIDE CRYS ER 20 MEQ PO TBCR
40.0000 meq | EXTENDED_RELEASE_TABLET | ORAL | Status: AC
  Administered 2023-10-05: 40 meq via ORAL
  Filled 2023-10-05: qty 2

## 2023-10-05 MED ORDER — FENTANYL 100 MCG/HR TD PT72
2.0000 | MEDICATED_PATCH | TRANSDERMAL | Status: DC
  Administered 2023-10-05 – 2023-10-14 (×4): 2 via TRANSDERMAL
  Filled 2023-10-05 (×3): qty 1
  Filled 2023-10-05 (×3): qty 2
  Filled 2023-10-05: qty 1

## 2023-10-05 MED ORDER — FENTANYL 75 MCG/HR TD PT72: 1.0000 | MEDICATED_PATCH | TRANSDERMAL | Status: DC

## 2023-10-05 MED ORDER — MAGNESIUM SULFATE 2 GM/50ML IV SOLN
2.0000 g | Freq: Once | INTRAVENOUS | Status: AC
  Administered 2023-10-05: 2 g via INTRAVENOUS
  Filled 2023-10-05: qty 50

## 2023-10-05 MED ORDER — METOPROLOL TARTRATE 5 MG/5ML IV SOLN
10.0000 mg | Freq: Four times a day (QID) | INTRAVENOUS | Status: DC
  Administered 2023-10-05 – 2023-10-11 (×23): 10 mg via INTRAVENOUS
  Filled 2023-10-05 (×23): qty 10

## 2023-10-05 MED ORDER — HYDROMORPHONE HCL 2 MG/ML IJ SOLN
4.0000 mg | Freq: Once | INTRAMUSCULAR | Status: AC
  Administered 2023-10-05: 4 mg via INTRAVENOUS
  Filled 2023-10-05: qty 2

## 2023-10-05 MED ORDER — HYDROMORPHONE 1 MG/ML IV SOLN
INTRAVENOUS | Status: DC
  Administered 2023-10-06 (×2): 30 mg via INTRAVENOUS
  Filled 2023-10-05 (×2): qty 30

## 2023-10-05 MED ORDER — DEXAMETHASONE SODIUM PHOSPHATE 4 MG/ML IJ SOLN
4.0000 mg | Freq: Every morning | INTRAMUSCULAR | Status: DC
Start: 2023-10-06 — End: 2023-10-11
  Administered 2023-10-06 – 2023-10-11 (×6): 4 mg via INTRAVENOUS
  Filled 2023-10-05 (×6): qty 1

## 2023-10-05 MED ORDER — LORAZEPAM 2 MG/ML IJ SOLN
1.0000 mg | INTRAMUSCULAR | Status: DC | PRN
  Administered 2023-10-06 – 2023-10-11 (×16): 1 mg via INTRAVENOUS
  Filled 2023-10-05 (×16): qty 1

## 2023-10-05 MED ORDER — FENTANYL 75 MCG/HR TD PT72: 2.0000 | MEDICATED_PATCH | TRANSDERMAL | Status: DC

## 2023-10-05 MED ORDER — HYDRALAZINE HCL 20 MG/ML IJ SOLN
10.0000 mg | Freq: Four times a day (QID) | INTRAMUSCULAR | Status: DC | PRN
  Administered 2023-10-05 – 2023-10-06 (×2): 10 mg via INTRAVENOUS
  Filled 2023-10-05 (×2): qty 1

## 2023-10-05 MED ORDER — HYDROMORPHONE 1 MG/ML IV SOLN
INTRAVENOUS | Status: DC
  Filled 2023-10-05: qty 30

## 2023-10-05 MED ORDER — DEXAMETHASONE SODIUM PHOSPHATE 4 MG/ML IJ SOLN
4.0000 mg | Freq: Once | INTRAMUSCULAR | Status: AC
  Administered 2023-10-05: 4 mg via INTRAVENOUS
  Filled 2023-10-05: qty 1

## 2023-10-05 NOTE — Progress Notes (Signed)
 IP PROGRESS NOTE  Subjective:   April [name] is well-known to me with a history metastatic colon cancer.  She completed another cycle of FOLFOX chemotherapy beginning 10/01/2023.  She reports developing nausea on the evening of day 1 chemotherapy.  No symptom of an allergic reaction.  She presented to the emergency room 10/03/2023 with nausea/vomiting and abdominal pain.  She reports no bowel movement since discharge from the hospital 09/25/2023.  She was admitted for further evaluation.  She reports improved pain relief with IV Dilaudid .  She remains constipated.  Objective: Vital signs in last 24 hours: Blood pressure (!) 155/119, pulse 82, temperature 98.1 F (36.7 C), temperature source Oral, resp. rate 13, height 5' 4 (1.626 m), weight 119 lb 4.3 oz (54.1 kg), last menstrual period 01/07/2006, SpO2 100%.  Intake/Output from previous day: 09/21 0701 - 09/22 0700 In: 510 [P.O.:480; I.V.:30] Out: -   Physical Exam:  HEENT: No thrush or ulcers Lungs: Clear bilaterally Cardiac: Regular rate and rhythm Abdomen: Distended, firm, no discrete mass Extremities: No leg edema   Portacath/PICC-without erythema  Lab Results: Recent Labs    10/04/23 0401 10/05/23 0500  WBC 6.9 7.1  HGB 9.7* 9.2*  HCT 31.3* 29.9*  PLT 272 257    BMET Recent Labs    10/04/23 0401 10/05/23 0500  NA 135 135  K 3.4* 3.2*  CL 95* 96*  CO2 31 28  GLUCOSE 82 89  BUN 8 8  CREATININE 0.47 0.48  CALCIUM  8.8* 8.4*    Lab Results  Component Value Date   CEA1 190.0 (H) 09/22/2023   CEA 289.71 (H) 09/30/2023   CAN199 213 (H) 03/23/2022    Studies/Results: CT ABDOMEN PELVIS W CONTRAST Result Date: 10/03/2023 CLINICAL DATA:  Abdominal pain with nausea and vomiting. History of stage IV colon cancer. Last treatment 10/01/2023. EXAM: CT ABDOMEN AND PELVIS WITH CONTRAST TECHNIQUE: Multidetector CT imaging of the abdomen and pelvis was performed using the standard protocol following bolus  administration of intravenous contrast. RADIATION DOSE REDUCTION: This exam was performed according to the departmental dose-optimization program which includes automated exposure control, adjustment of the mA and/or kV according to patient size and/or use of iterative reconstruction technique. CONTRAST:  OMNIPAQUE  IOHEXOL  300 MG/ML  SOLN COMPARISON:  09/20/2023, 08/02/2023 FINDINGS: Lower chest: Stable 6 mm nodule over the medial left lower lobe. Mild nodularity along the right diaphragmatic surface unchanged. Hepatobiliary: Numerous liver metastases most prominent over the right lobe with mild interval progression. Gallbladder unremarkable. Biliary stent unchanged. Stable dilatation of the central patent ducts. Pancreas: Normal. Spleen: Normal. Adrenals/Urinary Tract: Adrenal glands are normal. Kidneys are normal size with continued evidence bilateral nephrolithiasis as well as stable dilatation of the upper pole left intrarenal collecting system. Stable left renal cyst. Stable dilatation of the left ureter. Right ureter and bladder are unremarkable. Stomach/Bowel: Stomach and small bowel is unremarkable. Appendix not well visualized. Essentially stable ill-defined soft tissue density over the rectosigmoid junction compatible with known colon cancer. Moderate fecal retention throughout the colon. Vascular/Lymphatic: Abdominal aorta is normal in caliber. Remaining vascular structures are unremarkable. Stable mild adenopathy over the gastrohepatic ligament. No other significant adenopathy identified. Reproductive: Uterus difficult to visualize. Oval simple cystic collection in the left pelvis unchanged measuring 5.6 x 6.5 cm. Other: Minimal free fluid most notable over the left upper quadrant. No significant change in bilateral anterior abdominal omental caking. Musculoskeletal: Unchanged. IMPRESSION: 1. No acute findings in the abdomen/pelvis. 2. Essentially stable ill-defined soft tissue density over the  rectosigmoid junction compatible with known colon cancer. Numerous liver metastases with mild interval progression. Stable peritoneal metastases/omental caking. Stable minimal adenopathy over the gastrohepatic ligament. 3. Stable bilateral nephrolithiasis with stable dilatation of the upper pole left intrarenal collecting system and left ureter. 4. Stable 6 mm nodule over the medial left lower lobe. Metastatic disease is possible as recommend attention on follow-up. 5. Stable 5.6 x 6.5 cm simple cystic collection in the left pelvis. Possible loculated fluid versus left ovarian cyst or cystic peritoneal metastasis. 6. Stable minimal free fluid most notable over the left upper quadrant. Electronically Signed   By: Toribio Agreste M.D.   On: 10/03/2023 18:11    Medications: I have reviewed the patient's current medications.  Assessment/Plan: Sigmoid colon cancer, stage IV (pT4a,pN2b,M1c) Colonoscopy 03/24/2019-3 rectal polyps-hyperplastic polyps, distal colon biopsy-at least intramucosal adenocarcinoma, completely obstructing mass in the distal sigmoid colon, could not be traversed, intact mismatch repair protein expression 03/24/2019-CEA 56.1 03/29/2019 CT abdomen/pelvis-circumferential thickening involving the entire mid and distal sigmoid colon to the level of the rectosigmoid junction, solid/cystic mass in the left ovary, bilateral nephrolithiasis Robotic assisted low anterior resection, mesenteric lymphadenectomy, bilateral salpingo-oophorectomy 04/08/2019 Pathology ([facility] review of outside pathology) metastatic adenocarcinoma involving the peritoneum overlying the round ligament, serosa of the urinary bladder, serosa of the right ureter a sacral area, pelvic peritoneum, and left ovary.  Omental biopsy with focal mucin pools with no carcinoma cells identified, right hemidiaphragm biopsy involved by metastatic adenocarcinoma, invasive adenocarcinoma the sigmoid colon, moderately differentiated, T4a, perineural  and vascular invasion present, 7/22 lymph nodes, multiple tumor deposits, resection margins negative Negative for PD-L1, low probability of MSI-high, HER-2 negative, negative for BRAF, NRAS and KRAS alterations CTs 05/19/2019-no evidence of metastatic disease, findings suspicious for colitis of the transverse and ascending colon, small amount of ascites in the cul-de-sac, bilateral renal calculi Cycle 1 FOLFIRI 05/24/2019, bevacizumab  added with cycle 2 Cycle 4 FOLFIRI/bevacizumab  07/05/2019 Cycle 5 FOLFIRI 07/27/2019, bevacizumab  held secondary to hypertension Cycle 6 FOLFIRI 08/10/1999, bevacizumab  held secondary to hypertension CTs 08/30/2019-no evidence of recurrent disease, new subsolid right lower lobe nodule felt to be inflammatory, emphysema Cycle 7 FOLFIRI 09/26/2019, bevacizumab  remains on hold secondary to hypertension Cycle 8 FOLFIRI 10/17/2019, bevacizumab  held secondary to hypertension, Udenyca  added for neutropenia Cycle 9 FOLFIRI 11/07/2019, bevacizumab  held secondary to hypertension, Udenyca  Cycle 10 FOLFIRI 11/28/2019, bevacizumab  held, Udenyca  Cycle 11 FOLFIRI 12/26/2019, bevacizumab  held, Udenyca  CTs 01/25/2020-no evidence of recurrent disease, resolution of right lower lobe nodule Maintenance Xeloda  beginning 02/06/2020 CTs 04/25/2020- no evidence of metastatic disease, multiple bilateral renal calculi without hydronephrosis Maintenance Xeloda  continued CT abdomen/pelvis without contrast 08/10/2020-bilateral staghorn renal calculi, no evidence of metastatic disease; addendum 08/23/2020-small but increasing omental nodules. Cycle 1 FOLFIRI/panitumumab  09/19/2020 Cycle 2 FOLFIRI/Panitumumab  10/03/2020 Cycle 3 FOLFIRI/Panitumumab  10/17/2020 Cycle 4 FOLFIRI/panitumumab  10/31/2020 Cycle 5 FOLFIRI/Panitumumab  11/14/2020 CTs 11/26/2020-stable omental metastases compared to 10/22/2020, mildly decreased from 08/10/2020.  Left lower quadrant tiny paracolic gutter implant slightly decreased from CT  08/10/2020.  No new or progressive metastatic disease in the abdomen or pelvis. Cycle 6 FOLFIRI/Panitumumab  11/28/2020, irinotecan  dose reduced, treatment schedule adjusted to every 3 weeks going forward Cycle 7 FOLFIRI/panitumumab  12/24/2020 Cycle 8 FOLFIRI/Panitumumab  01/16/2021--treatment held due to hypertension in the infusion area. Cycle 8 FOLFIRI/panitumumab  01/21/2021 Cycle 9 FOLFIRI/panitumumab  02/11/2021 Cycle 10 FOLFIRI/Panitumumab  03/04/2021 Cycle 11 FOLFIRI/Panitumumab  03/25/2021 CT abdomen/pelvis 04/10/2021-stable right omental and left posterior paracolic gutter nodules Cycle 12 FOLFIRI/panitumumab  04/15/2021 Cycle 13 FOLFIRI/panitumumab  05/06/2021 Cycle 14 FOLFIRI/Panitumumab  05/27/2021 Cycle 15 FOLFIRI/Panitumumab  06/17/2021 CT abdomen/pelvis 07/05/2021-slight enlargement of  a dominant right omental implant, no new implants Patient requested a treatment break CT abdomen/pelvis 10/04/2021-mild increase in size of multiple omental soft tissue nodules, 4 mm calculus in the distal left ureter adjacent to the ureteral stent Cycle 1 Lonsurf  10/14/2021 10/28/2021 Avastin  every 2 weeks Cycle 2 Lonsurf  11/11/2021 Cycle 3 Lonsurf  12/09/2021 Cycle 4 Lonsurf  01/06/2022 Avastin  held 01/20/2022 due to proteinuria, 24-hour urine 43 mg CT/pelvis 01/30/2022-possible new peritoneal implant at the transverse colon, no significant change in other omental/peritoneal implants, stable chronic rectal wall thickening, status post removal of double-J left ureteral stent with no evidence of hydronephrosis or ureteral calculus, nonobstructing bilateral renal calculi Cycle 5 Lonsurf  02/05/2022 or 02/06/2022 Avastin  every 2 weeks Cycle 6 Lonsurf  03/03/2022 CTs 04/05/2022-increase in size and number of peritoneal implants compared to January 2024 central liver mass narrowing the anterior branch of the right portal vein, decreased biliary duct dilation, biliary stents in place, stable right cardiophrenic lymph nodes, separate  pigtail stent in the lumen of the proximal duodenum Cycle 1 FOLFOX 04/14/2022 CT abdomen/pelvis 04/16/2022-stent within the third portion of the duodenum, stable peritoneal nodules, indwelling biliary stents with mild left biliary duct dilation Cycle 2 FOLFOX 04/28/2022, Emend and prophylactic dexamethasone  added Cycle 3 FOLFOX 05/12/2022, Aloxi , Emend, prophylactic dexamethasone , Compazine  and lorazepam  as needed Chemotherapy held 05/25/2020 due to severe hypertension, evaluated in the emergency department Cycle 4 FOLFOX 06/03/2022 Cycle 5 FOLFOX 06/16/2022, oxaliplatin  dose reduced and 5-FU bolus eliminated due to neutropenia CT 06/25/2022-unchanged soft tissue at the hepatic hilum, unchanged, bile duct stent, decreased size of peritoneal and omental nodules and diminished volume of likely fluid in the lower left pelvis Cycle 6 FOLFOX 06/30/2022 Cycle 7 FOLFOX 07/21/2022 CT 07/24/2022 (in the emergency department with nausea, headache, abdominal pain)-no acute pathology.  No significant interval change in omental nodularity. Cycle 8 FOLFOX 08/04/2022, pruritus on the palms at the end of the oxaliplatin  infusion, Pepcid  and Benadryl  given, symptoms resolved, infusion not resumed Cycle 9 FOLFOX 08/18/2022-Pepcid  and Benadryl  added to premedications, Oxaliplatin  diluted in a larger volume and infusion time increased Cycle 10 FOLFOX 09/08/2022 Cycle 11 FOLFOX 09/22/2022 10/06/2022 patient declined treatment CTs 10/15/2022-similar soft tissue fullness in the hepatic hilum with biliary stents in place, decrease in omental/peritoneal metastases Maintenance capecitabine  10/26/2022 CT abdomen/pelvis 11/19/2022-mild increase in tumor studding at the left paracolic gutter and omentum, circumferential wall thickening of the rectum, biliary stents in place with minimal increase in intrahepatic biliary dilation, nonobstructive renal calculi Colonoscopy 11/23/2022-anastomosis at 15 cm from the anal verge-patent, 2 cm polypoid tissue  at the anastomosis biopsied, invasive adenocarcinoma moderately differentiated, preserved expression of major MMR proteins CTs 12/15/2022: Possible developing nodule in the lateral left liver, increased omental nodularity, persistent wall thickening in the upper rectum CT abdomen/pelvis 12/24/2022: Increased thickened fold in the transverse colon-infectious/inflammatory, 2.1 cm lateral left liver lesion, potential new 5 mm segment 5 lesion, stable biliary stents with intrahepatic biliary dilation, central biliary wall thickening near the hepatic hilum, multiple peritoneal/omental implants unchanged from 12/15/2022, but likely larger than 11/19/2022 Cycle 1 irinotecan /Panitumumab  01/06/2023 Cycle 2 irinotecan /Panitumumab  01/20/2023 CTs 01/24/2023-slight increase in omental nodularity. Cycle 3 irinotecan /Panitumumab  02/03/2023 Cycle 4 irinotecan /panitumumab  02/17/2023 CTs abdomen/pelvis 02/24/2023-subtly hypoattenuating lesion within the peripheral aspect of hepatic segment 2 redemonstrated.  Additional scattered subcentimeter low-density lesions within the liver too small to characterize.  Prominent intrahepatic biliary dilatation with biliary stents in place.  Similar degree of nodularity along the omentum and left paracolic gutter.  No ascites. CTs 03/09/2023-indwelling endoscopically placed stents; level of intrahepatic biliary  duct dilatation is slightly improved on the left and some on the right.  Gallbladder is dilated with wall edema which is increasing.  More nodular tissue extending up along the porta hepatis.  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  Rectum with nodular wall thickening with extension into the adjacent tissue, nodular areas seen extending perirectal and presacral spaces.  Peritoneal carcinomatosis again identified. Irinotecan /Panitumumab  04/02/2023 Irinotecan /panitumumab  04/16/2023 Irinotecan /Panitumumab  05/07/2023 Irinotecan /Panitumumab  06/19/2023 CT abdomen/pelvis 06/21/2023-stable  heterogeneous enhancement throughout the liver with nodularity along the surface of the liver.  Small amount of perihepatic ascites.  Stable nodularity right abdomen.  Circumferential rectal wall thickening/mass unchanged. Irinotecan /Panitumumab  discontinued 07/10/2023 due to lack of clinical benefit/poor tolerance Appointment with Dr. Sheilda 07/23/2023-FOLFOX, guardant testing CTs 08/02/2023-mid rectal soft tissue mass, mildly progressive.  Right hepatic metastases, progressive.  Peritoneal disease/omental caking stable versus mildly progressive.  Small volume abdominopelvic ascites mildly progressive. Cycle 1 FOLFOX 08/05/2023 Cycle 2 FOLFOX 08/20/2023 (Pepcid  and Benadryl  daily x 2 days before treatment, Pepcid  continued for 2 days post oxaliplatin ; oxaliplatin  administration time increased) CTs 09/20/2023 grossly stable soft tissue mass within the mid rectum.  Stable cystic structure within the left lower pelvis, either loculated fluid, cystic peritoneal metastases or left adnexal cyst.  Interval development of mild left hydronephrosis and left hydroureter without clear cause for obstruction identified.  Stable peritoneal metastases and omental caking most pronounced in the right upper quadrant.  Diffuse metastatic disease about the right lobe of the liver subjectively worse since the prior exam.  Small volume ascites. Cycle 3 FOLFOX planned 10/01/2023 CT abdomen/pelvis 10/03/2023: Stable rectal soft tissue, stable peritoneal metastasis/omental caking, stable cystic collection in the left pelvis     2. Hypertension G4 P3, twins Kidney stones-bilateral staghorn renal calculi on CT 08/10/2020 Infected Port-A-Cath 09/12/2019-placed on Augmentin , referred for Port-A-Cath removal; Port-A-Cath removed 09/14/2019; PICC line placed 09/14/2019; culture staph aureus.  Course of Septra  completed. Neutropenia secondary to chemotherapy-Udenyca  added with cycle 8 FOLFIRI Right nephrostomy tube 09/12/2020; stent placed  10/09/2020, percutaneous nephrostolithotomy treatment of right-sided kidney stones 10/09/2020, malpositioned right ureter stent replacement 10/23/2020, right ureter stent removed 10/29/2020 Percutaneous left nephrostolithotomy 10/01/2021-no renal stone identified, left ureter stent left in place Cystoscopy 10/14/2021-stent removed 8.  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  2 moderate biliary strictures found in the hepatic duct system, malignant appearing.  Left and right hepatic duct and all intrahepatic branches were moderately dilated secondary to the strictures.  The strictures were dilated.  Plastic stent placed into the left hepatic duct and right hepatic duct. 9.  Admission 04/16/2022 with nausea/vomiting and abdominal pain, felt to be acute toxicity related to chemotherapy, discharged home 04/18/2022 10.  Admission 11/19/2022 with nausea/vomiting and abdominal pain-improved 11.  Admission 12/15/2022 with fever and right submandibular swelling/tenderness CT neck 12/15/2022: 2 mm distal right submandibular duct calculus with mild inflammatory change adjacent to the right submandibular gland 12.  Admission 12/24/2022 with a fever nausea/vomiting, diarrhea, Klebsiella bacteremia completed course of Augmentin  13.  Admission with abdominal pain and nausea/vomiting 03/09/2023 through  03/14/2023-concern for gallbladder  inflammation.  Not felt to be a surgical candidate.  Percutaneous cholecystostomy tube placed 03/11/2023.  Tube removed 04/24/2023. 14.  Admission 06/20/2023 with abdominal pain and constipation, pain improved partially following a bowel movement 15.  Admission 08/02/2023 with abdominal pain, cycle 1 salvage FOLFOX 08/05/2023 16.  Admission 09/20/2023 with nausea and abdominal pain 17.  Admission 10/03/2023 with nausea and abdominal pain   April [name] has metastatic colon cancer.  She has developed progressive metastatic disease despite multiple systemic treatment regimens.  She has completed 3 cycles of salvage therapy with FOLFOX.  Her clinical status has declined while on FOLFOX and the CEA is higher.  I reviewed the admission CT images.  She does not have significant ascites.  The abdominal distention appears to be related to omental tumor caking.  She has been admitted repeatedly with abdominal pain, nausea, and constipation.  Her symptoms are very likely related to carcinomatosis.  Her pain has not been adequately controlled with oral narcotics.  She is scheduled for a lumbar infusion pump next month.  I explained the current difficult situation to April [name].  Her symptoms are related to progressive metastatic colon cancer and have not improved with the current treatment regimen.  Treatment options are very limited given her treatment history.  She appears to be a candidate for hospice care.  We will need to continue difficult discussions with April [name] regarding goals of care and hospice.  Recommendations: 1.  Continue narcotic analgesics for pain, consider switching to a Dilaudid  infusion/bolus for home pain management 2.  Bowel regimen 3.  Hypertension per the medical service 4.  Goals of care/hospice discussions with the palliative care team and myself  LOS: 1 day   Arley Hof, MD   10/05/2023, 9:11 AM

## 2023-10-05 NOTE — Progress Notes (Signed)
 PROGRESS NOTE    Bridney Guadarrama  FMW:[id number] DOB: Jun 10, 1975 DOA: 10/03/2023 PCP: Oris Camie BRAVO, NP  Outpatient Specialists:     Brief Narrative:  Patient is a 48 year old female with past medical history significant for stage IV colon cancer on chemotherapy, essential hypertension, anemia of chronic disease with baseline hemoglobin 9-12 grams per deciliter.  Patient was admitted to [facility] on 10/03/2023 with intractable abdominal pain.  Patient was initially started on Dilaudid  PCA, and oxycodone  ER was eventually added.  Patient continues to report significant abdominal pain.  Apparently, bowel movement was over a week ago, according to the patient.  Lactulose  and senna have been changed to Relistor .  Input from the oncology and palliative care team is highly appreciated.  10/05/2023: Patient seen alongside patient's nurse and rapid response nurse.  Patient is trying to have a bowel movement without success so far.  Patient developed nausea and vomiting later.  Patient be transferred to the stepdown unit.  Oncology team and palliative care team are assisting in patient's care.  Apparently, oncology team has broached hospice with the patient's several life, without any progress.  Patient remains full code.   Assessment & Plan:   Principal Problem:   Intractable abdominal pain Active Problems:   Colorectal cancer, stage IV (HCC)   Nausea & vomiting   Dehydration   Acute prerenal azotemia   History of essential hypertension   Anemia of chronic disease   Intractable abdominal pain: - The above documentation. - History of stage IV colon cancer noted. - Continue PCA pump. - Continue oxycodone  ER 10 mg p.o. twice daily. - Palliative care consult. - Oncology input is appreciated. - There is need to discuss goal of care.  Nausea and vomiting: - Likely multifactorial. - Patient has been on significant amount of opiates.  Patient is known to have stage IV  colon cancer with carcinomatosis.  Continues significantly constipated. - Continue Relistor . - Manage patient supportively. - Patient is hospice appropriate.  Hypertension: -Slowly improving. - Continue to monitor closely. - Resume home antihypertensives.  Hypokalemia: - Potassium of 3.2. - Monitor and replete. - Magnesium  1.8 today.   Abnormal LFTs: - Last AST and ALT (AST of 149 and ALT of 75). - Last alkaline phosphatase (617).: - History of stage IV colon cancer.   DVT prophylaxis: SCD. Code Status: Full code Family Communication:  Disposition Plan: This will depend on hospital course   Consultants:  Cardiology team. Palliative care team.   Procedures:  NG tube placed by nursing staff today.   Antimicrobials:  None   Subjective: - Continues to report abdominal pain. - Nausea vomiting.    Objective: Vitals:   10/05/23 1200 10/05/23 1324 10/05/23 1400 10/05/23 1452  BP:  (!) 136/106 (!) 184/128   Pulse:  75 74   Resp: 15 19  16   Temp:  99 F (37.2 C)    TempSrc:  Oral    SpO2:  100%    Weight:      Height:        Intake/Output Summary (Last 24 hours) at 10/05/2023 1538 Last data filed at 10/05/2023 1000 Gross per 24 hour  Intake 30 ml  Output --  Net 30 ml   Filed Weights   10/03/23 2300 10/04/23 0435 10/05/23 0413  Weight: 53.4 kg 53.4 kg 54.1 kg    Examination:  General exam: Seems to be in painful distress while trying to have a bowel movement.   Respiratory system: Clear to auscultation.  Respiratory effort normal. Cardiovascular system: S1 & S2 heard Gastrointestinal system: Abdomen is firm to touch.   Central nervous system: Alert and oriented.  Moves all extremities.   Extremities: No leg edema.  Data Reviewed: I have personally reviewed following labs and imaging studies  CBC: Recent Labs  Lab 09/30/23 1340 10/03/23 1337 10/04/23 0401 10/05/23 0500  WBC 12.2* 8.4 6.9 7.1  NEUTROABS 8.0*  --  5.6 5.1  HGB 11.5* 10.8* 9.7*  9.2*  HCT 35.1* 33.9* 31.3* 29.9*  MCV 84.4 87.4 87.7 88.2  PLT 410* 303 272 257   Basic Metabolic Panel: Recent Labs  Lab 09/30/23 1340 10/03/23 1337 10/04/23 0401 10/05/23 0500  NA 133* 135 135 135  K 2.8* 3.5 3.4* 3.2*  CL 89* 93* 95* 96*  CO2 32 30 31 28   GLUCOSE 108* 96 82 89  BUN 7 11 8 8   CREATININE 0.42* 0.45 0.47 0.48  CALCIUM  9.9 9.7 8.8* 8.4*  MG 2.0 2.3 2.1 1.8  PHOS  --   --   --  2.7   GFR: Estimated Creatinine Clearance: 72.6 mL/min (by C-G formula based on SCr of 0.48 mg/dL). Liver Function Tests: Recent Labs  Lab 09/30/23 1340 10/03/23 1337 10/04/23 0401 10/05/23 0500  AST 79* 118* 149*  --   ALT 39 60* 75*  --   ALKPHOS 732* 660* 617*  --   BILITOT 0.7 0.8 1.2  --   PROT 9.5* 8.4* 7.2  --   ALBUMIN 3.7 3.4* 3.1* 3.1*   Recent Labs  Lab 10/03/23 1337  LIPASE 21   No results for input(s): AMMONIA in the last 168 hours. Coagulation Profile: No results for input(s): INR, PROTIME in the last 168 hours. Cardiac Enzymes: No results for input(s): CKTOTAL, CKMB, CKMBINDEX, TROPONINI in the last 168 hours. BNP (last 3 results) No results for input(s): PROBNP in the last 8760 hours. HbA1C: No results for input(s): HGBA1C in the last 72 hours. CBG: No results for input(s): GLUCAP in the last 168 hours. Lipid Profile: No results for input(s): CHOL, HDL, LDLCALC, TRIG, CHOLHDL, LDLDIRECT in the last 72 hours. Thyroid Function Tests: No results for input(s): TSH, T4TOTAL, FREET4, T3FREE, THYROIDAB in the last 72 hours. Anemia Panel: No results for input(s): VITAMINB12, FOLATE, FERRITIN, TIBC, IRON, RETICCTPCT in the last 72 hours. Urine analysis:    Component Value Date/Time   COLORURINE YELLOW 10/03/2023 2142   APPEARANCEUR CLEAR 10/03/2023 2142   LABSPEC >1.046 (H) 10/03/2023 2142   PHURINE 6.0 10/03/2023 2142   GLUCOSEU NEGATIVE 10/03/2023 2142   HGBUR NEGATIVE 10/03/2023 2142    BILIRUBINUR NEGATIVE 10/03/2023 2142   KETONESUR NEGATIVE 10/03/2023 2142   PROTEINUR NEGATIVE 10/03/2023 2142   NITRITE NEGATIVE 10/03/2023 2142   LEUKOCYTESUR NEGATIVE 10/03/2023 2142   Sepsis Labs: @LABRCNTIP (procalcitonin:4,lacticidven:4)  )No results found for this or any previous visit (from the past 240 hours).       Radiology Studies: DG Abd 1 View Result Date: 10/05/2023 CLINICAL DATA:  NG tube placement EXAM: ABDOMEN - 1 VIEW COMPARISON:  Same day abdominal radiograph and prior studies FINDINGS: Interval placement of enteric tube with tip in side hole projecting over the body of the stomach. Paucity of visualized bowel gas limits evaluation. Partially imaged biliary stent. Bilateral nephrolithiasis. IMPRESSION: NG tube in place. Electronically Signed   By: Michaeline Blanch M.D.   On: 10/05/2023 15:22   DG Abd 1 View Result Date: 10/05/2023 CLINICAL DATA:  Abdominal pain, colon cancer EXAM: ABDOMEN - 1 VIEW  COMPARISON:  None Available. FINDINGS: Paucity of bowel gas. Large stool throughout the colon. Bilateral renal stones. Biliary stents, stable to prior. No acute osseous abnormality. Pelvic bones are partially visualized. IMPRESSION: Redemonstration of biliary stents. Bilateral nephrolithiasis. Paucity of bowel gas. Electronically Signed   By: Megan  Zare M.D.   On: 10/05/2023 13:59   CT ABDOMEN PELVIS W CONTRAST Result Date: 10/03/2023 CLINICAL DATA:  Abdominal pain with nausea and vomiting. History of stage IV colon cancer. Last treatment 10/01/2023. EXAM: CT ABDOMEN AND PELVIS WITH CONTRAST TECHNIQUE: Multidetector CT imaging of the abdomen and pelvis was performed using the standard protocol following bolus administration of intravenous contrast. RADIATION DOSE REDUCTION: This exam was performed according to the departmental dose-optimization program which includes automated exposure control, adjustment of the mA and/or kV according to patient size and/or use of iterative  reconstruction technique. CONTRAST:  OMNIPAQUE  IOHEXOL  300 MG/ML  SOLN COMPARISON:  09/20/2023, 08/02/2023 FINDINGS: Lower chest: Stable 6 mm nodule over the medial left lower lobe. Mild nodularity along the right diaphragmatic surface unchanged. Hepatobiliary: Numerous liver metastases most prominent over the right lobe with mild interval progression. Gallbladder unremarkable. Biliary stent unchanged. Stable dilatation of the central patent ducts. Pancreas: Normal. Spleen: Normal. Adrenals/Urinary Tract: Adrenal glands are normal. Kidneys are normal size with continued evidence bilateral nephrolithiasis as well as stable dilatation of the upper pole left intrarenal collecting system. Stable left renal cyst. Stable dilatation of the left ureter. Right ureter and bladder are unremarkable. Stomach/Bowel: Stomach and small bowel is unremarkable. Appendix not well visualized. Essentially stable ill-defined soft tissue density over the rectosigmoid junction compatible with known colon cancer. Moderate fecal retention throughout the colon. Vascular/Lymphatic: Abdominal aorta is normal in caliber. Remaining vascular structures are unremarkable. Stable mild adenopathy over the gastrohepatic ligament. No other significant adenopathy identified. Reproductive: Uterus difficult to visualize. Oval simple cystic collection in the left pelvis unchanged measuring 5.6 x 6.5 cm. Other: Minimal free fluid most notable over the left upper quadrant. No significant change in bilateral anterior abdominal omental caking. Musculoskeletal: Unchanged. IMPRESSION: 1. No acute findings in the abdomen/pelvis. 2. Essentially stable ill-defined soft tissue density over the rectosigmoid junction compatible with known colon cancer. Numerous liver metastases with mild interval progression. Stable peritoneal metastases/omental caking. Stable minimal adenopathy over the gastrohepatic ligament. 3. Stable bilateral nephrolithiasis with stable  dilatation of the upper pole left intrarenal collecting system and left ureter. 4. Stable 6 mm nodule over the medial left lower lobe. Metastatic disease is possible as recommend attention on follow-up. 5. Stable 5.6 x 6.5 cm simple cystic collection in the left pelvis. Possible loculated fluid versus left ovarian cyst or cystic peritoneal metastasis. 6. Stable minimal free fluid most notable over the left upper quadrant. Electronically Signed   By: Toribio Agreste M.D.   On: 10/03/2023 18:11        Scheduled Meds:  amLODipine   10 mg Oral Daily   Chlorhexidine  Gluconate Cloth  6 each Topical Daily   cloNIDine   0.3 mg Oral TID   dexamethasone   4 mg Oral Daily   feeding supplement  237 mL Oral BID BM   fentaNYL   2 patch Transdermal Q72H   HYDROmorphone    Intravenous Q4H   methylnaltrexone   8 mg Subcutaneous Q24H   metoprolol  tartrate  100 mg Oral BID   potassium chloride   40 mEq Oral Q4H   sodium chloride  flush  10-40 mL Intracatheter Q12H   Continuous Infusions:  octreotide  (SANDOSTATIN ) 500 mcg in sodium chloride  0.9 %  250 mL (2 mcg/mL) infusion       LOS: 1 day    Time spent: 55 minutes.    Leatrice Chapel, MD  [facility] Pager #: [phone] 7PM-7AM contact night coverage as above

## 2023-10-05 NOTE — Progress Notes (Addendum)
                                                                                                                                                                                                            Progress note-   Returned to patient's beside for re-evaluation.  She reports continued severe pain in her abdomen. Minimally improved with increased PCA dose. She would like better relief.   Reviewed abd xray- large stool, paucity of gas is concerning for obstruction. NG tube has been placed.   We discussed goals of care- she continues to request full scope, full code. If she were unable to make her decisions she would prefer for her mother to be her decision maker.   Plan:   -increase PCA to 0.9mg  bolus dose with lockout -start lorazepam  1mg  IV q4 hours prn for nausea or anxiety -dexamethasone  4mg  IV now (to equal 8mg  total today with 4mg  po previously received)  -dexamethasone  4mg  IV daily starting tomorrow -continue octreotide  -Requested pharmacy to change po medications to IV due to patient with likely ileus/obstruction related to peritoneal carcinamatosis  Cassondra Stain, AGNP-C Palliative Medicine  Additional time: 

## 2023-10-05 NOTE — Consult Note (Addendum)
 Consultation Note Date: 10/05/2023   Patient Name: April [name]  DOB: [birth year]  MRN: [id number]  Age / Sex: 48 y.o., female  PCP: Early, Camie BRAVO, NP Referring Physician: Rosario Leatrice FERNS, MD  Reason for Consultation:    Goal of care/Code status  HPI/Patient Profile: 48 y.o. 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 Palliative medicine consulted for medical decision making assistance and symptom management.    Primary Decision Maker PATIENT  Discussion: Chart reviewed including labs, progress notes, imaging from this and previous encounters.  CT scan of abdomen/pelvis reviewed- shows progression of liver metastases. Stable peritoneal mets and noted omental caking.   She has been constipated and has not had a BM since discharge 9/12. On evaluation Telena is awake, alert and oriented. Reports sharp pain under her navel that radiates outward.  She has minimal relief when pressing dilaudid  button.  Plan to receive relistor  today.  She is scheduled for intrathecal pain pump to be placed the first week of October.  Per chart review- she has received 26.4mg  total IV Dilaudid  through PCA.  Goals of care discussion was deferred due to patient's distressing pain.    SUMMARY OF RECOMMENDATIONS -Colorectal cancer with diffuse mets throughout abdomen- pain likely worsened due to severe constipation- plan to receive Relistor  today -4mg  IV dilaudid  x1 now -Increase PCA pump to 0.8mg  bolus dose with 10 min lockout -Increase Fentanyl  patch to 200mcg q 72 hr -Start octreotide  50mcg/hr -Will address goals of care and code status when symptoms are better controlled    Code  Status/Advance Care Planning:   Code Status: Full Code    Prognosis:   Unable to determine  Discharge Planning: To Be Determined  Primary Diagnoses: Present on Admission:  Intractable abdominal pain  Nausea & vomiting  Dehydration  Acute prerenal azotemia  Colorectal cancer, stage IV (HCC)  Anemia of chronic disease   Review of Systems  Constitutional:  Positive for activity change and appetite change.  Gastrointestinal:  Positive for constipation.    Physical Exam Vitals and nursing note reviewed.  Constitutional:      Appearance: She is ill-appearing.     Comments: cachectic  Cardiovascular:     Rate and Rhythm: Normal rate.  Pulmonary:     Effort: Pulmonary effort is normal.  Neurological:     Mental Status: She is alert and oriented to person, place, and time.     Vital Signs: BP (!) 136/106 (BP Location: Right Arm)   Pulse 75   Temp 99 F (37.2 C) (Oral)   Resp 19   Ht 5' 4 (1.626 m)   Wt 54.1 kg   LMP 01/07/2006   SpO2 100%   BMI 20.47 kg/m  Pain Scale: 0-10 POSS *See Group Information*: 1-Acceptable,Awake and alert Pain Score: 9    SpO2: SpO2: 100 % O2  Device:SpO2: 100 % O2 Flow Rate: .   IO: Intake/output summary:  Intake/Output Summary (Last 24 hours) at 10/05/2023 1352 Last data filed at 10/05/2023 1000 Gross per 24 hour  Intake 30 ml  Output --  Net 30 ml    LBM: Last BM Date :  (Pt states been over a week) Baseline Weight: Weight: 53.4 kg Most recent weight: Weight: 54.1 kg       Thank you for this consult. Palliative medicine will continue to follow and assist as needed.   Signed by: Cassondra Stain, AGNP-C Palliative Medicine  Time includes:   Preparing to see the patient (e.g., review of tests) Obtaining and/or reviewing separately obtained history Performing a medically necessary appropriate examination and/or evaluation Counseling and educating the patient/family/caregiver Ordering medications, tests, or  procedures Referring and communicating with other health care professionals (when not reported separately) Documenting clinical information in the electronic or other health record Independently interpreting results (not reported separately) and communicating results to the patient/family/caregiver Care coordination (not reported separately) Clinical documentation   Please contact Palliative Medicine Team phone at [phone] for questions and concerns.  For individual provider: See Tracey

## 2023-10-05 NOTE — Plan of Care (Signed)

## 2023-10-05 NOTE — TOC Initial Note (Signed)
 Transition of Care [facility]) - Initial/Assessment Note    Patient Details  Name: April [name] MRN: [id number] Date of Birth: [birth year]  [facility]) CM/SW Contact:    Doneta Glenys DASEN, RN Phone Number: 10/05/2023, 11:54 AM  Clinical Narrative:                 CM met with patient at bedside. PTA lives in a house with children;denies DME;HH;oxygen and SDOH needs. Patient's Emme, [place] (Mother) [phone] will transport at discharge. IP CM will follow progression to discharge for needs.  Expected Discharge Plan: Home/Self Care Barriers to Discharge: No Barriers Identified, Continued Medical Work up   Patient Goals and CMS Choice Patient states their goals for this hospitalization and ongoing recovery are:: Home with pain controlled CMS Medicare.gov Compare Post Acute Care list provided to::  (NA) Choice offered to / list presented to : NA [place] ownership interest in [facility].provided to:: Parent NA    Expected Discharge Plan and Services In-house Referral: NA Discharge Planning Services: CM Consult Post Acute Care Choice: NA Living arrangements for the past 2 months: Single Family Home                 DME Arranged: N/A DME Agency: NA       HH Arranged: NA HH Agency: NA        Prior Living Arrangements/Services Living arrangements for the past 2 months: Single Family Home Lives with:: Adult Children Patient language and need for interpreter reviewed:: Yes Do you feel safe going back to the place where you live?: Yes      Need for Family Participation in Patient Care: No (Comment) Care giver support system in place?: Yes (comment) Current home services:  (NA) Criminal Activity/Legal Involvement Pertinent to Current Situation/Hospitalization: No - Comment as needed  Activities of Daily Living   ADL Screening (condition at time of admission) Independently performs ADLs?: Yes (appropriate for developmental age) Is  the patient deaf or have difficulty hearing?: No Does the patient have difficulty seeing, even when wearing glasses/contacts?: No Does the patient have difficulty concentrating, remembering, or making decisions?: No  Permission Sought/Granted Permission sought to share information with : Case Manager Permission granted to share information with : Yes, Verbal Permission Granted  Share Information with NAME: Hira, Trent (Mother)  [phone]           Emotional Assessment Appearance:: Appears stated age Attitude/Demeanor/Rapport: Engaged Affect (typically observed): Appropriate Orientation: : Oriented to Self, Oriented to Place, Oriented to  Time, Oriented to Situation Alcohol / Substance Use: Not Applicable Psych Involvement: No (comment)  Admission diagnosis:  Intractable abdominal pain [R10.9] Pain of metastatic malignancy [G89.3] Patient Active Problem List   Diagnosis Date Noted   Dehydration 10/04/2023   Acute prerenal azotemia 10/04/2023   History of essential hypertension 10/04/2023   Anemia of chronic disease 10/04/2023   Intractable abdominal pain 10/03/2023   Palliative care encounter 09/22/2023   Uncontrolled pain 08/07/2023   Peritoneal carcinomatosis (HCC) 08/03/2023   Goals of care, counseling/discussion 08/03/2023   Constipation 08/03/2023   Intractable pain 08/03/2023   DNR (do not resuscitate) discussion 06/22/2023   ACP (advance care planning) 06/22/2023   Hypovolemia 06/21/2023   Rectal adenocarcinoma (HCC) 06/21/2023   Nausea & vomiting 06/21/2023   EKG, abnormal 06/21/2023   Counseling and coordination of care 06/21/2023   Malignant neoplasm metastatic to liver (HCC) 06/21/2023   Cancer associated pain 06/21/2023   Medication management 06/21/2023   Palliative care by  specialist 06/21/2023   Accelerated hypertension 06/08/2023   Cancer of ascending colon metastatic to intra-abdominal lymph node (HCC) 06/07/2023   Hypertensive urgency  03/24/2023   Abdominal pain, chronic, right upper quadrant 03/09/2023   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   Upper abdominal pain 02/24/2023   Hyperproteinemia 01/24/2023   Elevated alkaline phosphatase level 01/24/2023   History of biliary stent insertion 12/31/2022   Intrahepatic bile duct dilation 12/31/2022   Imaging of gastrointestinal tract abnormal 12/31/2022   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   Enterocolitis 12/26/2022   Bacteremia due to Klebsiella pneumoniae 12/26/2022   Hyponatremia 12/24/2022   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   Ascending aortic aneurysm (HCC) 12/16/2022   Diverticulosis of colon without hemorrhage 11/23/2022   Abnormal CT of the abdomen 11/21/2022   Cancer related pain 11/19/2022   Acute nonintractable headache 08/11/2022   History of ERCP 06/28/2022   Former smoker 04/30/2022   Intractable nausea and vomiting 04/16/2022   Protein-calorie malnutrition, severe 03/26/2022   Chronic constipation 03/26/2022   Metastatic colon cancer to liver (HCC) 03/24/2022   Obstructive jaundice 03/24/2022   Biliary stricture 03/24/2022   Biliary obstruction 03/23/2022   Hyperbilirubinemia 03/21/2022   Abnormal LFTs 03/21/2022   Kidney stone 08/01/2021   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 [place]  [address] Phone: [phone] Fax: [phone]     Social Drivers of Health (SDOH) Social History: SDOH Screenings   Food Insecurity: No Food Insecurity (10/03/2023)  Housing: Low Risk  (10/03/2023)  Transportation Needs: No Transportation Needs (10/03/2023)  Utilities: Not At Risk (10/03/2023)  Alcohol Screen: Low Risk  (11/11/2022)  Depression (PHQ2-9): Low Risk  (09/30/2023)  Financial Resource Strain: Low Risk  (11/11/2022)  Physical Activity: Inactive (11/11/2022)  Social Connections: Socially Isolated (01/24/2023)  Stress: No Stress Concern Present (11/11/2022)  Tobacco Use: Medium Risk (10/04/2023)  Health Literacy: Adequate Health Literacy (11/11/2022)   SDOH Interventions:     Readmission Risk Interventions    10/05/2023   11:32 AM 08/12/2023    1:51 PM 06/23/2023    3:12 PM  Readmission Risk Prevention Plan  Transportation Screening Complete Complete Complete  Medication Review Oceanographer) Complete Complete Complete  PCP or Specialist appointment within 3-5 days of discharge Complete Complete Complete  HRI or Home Care Consult Complete Complete Complete  SW Recovery Care/Counseling Consult Complete Complete Complete  Palliative Care Screening Complete Complete Not Applicable  Skilled Nursing Facility Not Applicable Not Applicable Not Applicable

## 2023-10-06 ENCOUNTER — Telehealth

## 2023-10-06 DIAGNOSIS — Z515 Encounter for palliative care: Secondary | ICD-10-CM | POA: Diagnosis not present

## 2023-10-06 DIAGNOSIS — E43 Unspecified severe protein-calorie malnutrition: Secondary | ICD-10-CM

## 2023-10-06 DIAGNOSIS — R109 Unspecified abdominal pain: Secondary | ICD-10-CM | POA: Diagnosis not present

## 2023-10-06 DIAGNOSIS — G893 Neoplasm related pain (acute) (chronic): Secondary | ICD-10-CM | POA: Diagnosis not present

## 2023-10-06 DIAGNOSIS — C19 Malignant neoplasm of rectosigmoid junction: Secondary | ICD-10-CM | POA: Diagnosis not present

## 2023-10-06 DIAGNOSIS — R112 Nausea with vomiting, unspecified: Secondary | ICD-10-CM | POA: Diagnosis not present

## 2023-10-06 LAB — RENAL FUNCTION PANEL
Albumin: 3.1 g/dL — ABNORMAL LOW (ref 3.5–5.0)
Anion gap: 13 (ref 5–15)
BUN: 10 mg/dL (ref 6–20)
CO2: 26 mmol/L (ref 22–32)
Calcium: 8.6 mg/dL — ABNORMAL LOW (ref 8.9–10.3)
Chloride: 98 mmol/L (ref 98–111)
Creatinine, Ser: 0.44 mg/dL (ref 0.44–1.00)
GFR, Estimated: >60 mL/min (ref >60)
Glucose, Bld: 101 mg/dL — ABNORMAL HIGH (ref 70–99)
Phosphorus: 3.8 mg/dL (ref 2.5–4.6)
Potassium: 3.7 mmol/L (ref 3.5–5.1)
Sodium: 137 mmol/L (ref 135–145)

## 2023-10-06 LAB — MAGNESIUM: Magnesium: 2 mg/dL (ref 1.7–2.4)

## 2023-10-06 MED ORDER — CLONIDINE HCL 0.3 MG/24HR TD PTWK
0.3000 mg | MEDICATED_PATCH | TRANSDERMAL | Status: DC
  Administered 2023-10-06 – 2023-10-13 (×2): 0.3 mg via TRANSDERMAL
  Filled 2023-10-06 (×2): qty 1

## 2023-10-06 MED ORDER — ONDANSETRON 8 MG/NS 50 ML IVPB
8.0000 mg | Freq: Three times a day (TID) | INTRAVENOUS | Status: DC
Start: 2023-10-06 — End: 2023-10-06
  Administered 2023-10-06: 8 mg via INTRAVENOUS
  Filled 2023-10-06: qty 54
  Filled 2023-10-06: qty 8

## 2023-10-06 MED ORDER — HYDRALAZINE HCL 20 MG/ML IJ SOLN
10.0000 mg | INTRAMUSCULAR | Status: DC | PRN
Start: 2023-10-06 — End: 2023-10-09
  Administered 2023-10-07: 10 mg via INTRAVENOUS
  Filled 2023-10-06: qty 1

## 2023-10-06 MED ORDER — HYDROMORPHONE 1 MG/ML IV SOLN
INTRAVENOUS | Status: DC
  Administered 2023-10-06: 30 mg via INTRAVENOUS
  Administered 2023-10-06 – 2023-10-07 (×2): 10.5 mg via INTRAVENOUS
  Administered 2023-10-07: 30 mg via INTRAVENOUS
  Administered 2023-10-07: 6 mg via INTRAVENOUS
  Filled 2023-10-06 (×2): qty 30

## 2023-10-06 MED ORDER — LABETALOL HCL 5 MG/ML IV SOLN
10.0000 mg | INTRAVENOUS | Status: DC | PRN
  Administered 2023-10-06 – 2023-10-07 (×3): 10 mg via INTRAVENOUS
  Filled 2023-10-06 (×3): qty 4

## 2023-10-06 MED ORDER — SODIUM CHLORIDE 0.9 % IV SOLN
8.0000 mg | Freq: Three times a day (TID) | INTRAVENOUS | Status: DC
  Administered 2023-10-06 – 2023-10-08 (×6): 8 mg via INTRAVENOUS
  Filled 2023-10-06: qty 4
  Filled 2023-10-06 (×2): qty 8
  Filled 2023-10-06 (×2): qty 4
  Filled 2023-10-06 (×2): qty 8
  Filled 2023-10-06: qty 4

## 2023-10-06 NOTE — Progress Notes (Signed)
 Daily Progress Note   Patient Name: April [name]       Date: 10/06/2023 DOB: 1975-02-05  Age: 48 y.o. MRN#: [id number] Attending Physician: Patsy Lenis, MD Primary Care Physician: Oris Camie BRAVO, NP Admit Date: 10/03/2023  Reason for Consultation/Follow-up: Establishing goals of care and Pain control  Patient Profile/HPI:    48 y.o. 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 Palliative medicine consulted for medical decision making assistance and symptom management.    Subjective: Chart reviewed including labs, progress notes, imaging from this and previous encounters.  Medication use reviewed- has received 56 mg via PCA over last 24 hours. She notes some relief with prn doses- but not enough to allow her to rest.  She has not used IV lorazepam .  Appreciate Oncology's discussion with patient- per Dr. Andriette note- unfortunately there are no further treatments available for her cancer, recommend hospice.  I met with April [name] at bedside.  She continues to have abdominal pain and nausea.  We discussed her conversation with Dr. Cloretta. She understands the recommendation is for hospice.  She expressed her worries that meant that she was giving up. We discussed that it doesn't mean giving up- but it means that medically we can't stop her cancer from growing, which means that she will likely die from this cancer. However, what can be done is to manage her pain and suffering and provide her support to live as best quality and as long as possible within the confines of  her cancer.  Her hope is to have her pain managed so she can be at home and interact with her family.  She worries about having support at home. We discussed some complicated family dynamics.  She worries about being a burden on her family. She was understandably emotional during our discussion.  We discussed hospice philosophy of care and services.  We discussed her code status. I recommended DNR status. We discussed that when she eventually dies that if we do CPR we will bring her back to likely being on life support and it will be her mom and kids that will have to make a decision  to take her off. She agrees to change to DNR status.  At close of our discussion- her mother and sister arrived to her room.  I shared with them the discussion I had with April [name] and decisions made. They note that there can also be healing through God- I supported them but also told them that unfortunately from a medical standpoint we can only offer good pain and symptom management. We discussed April [name]'s worries of being a burden at home. April [name] also expressed that she didn't want to be alone. She lives at home with her children and is alone sometimes when they go to work.  April [name]'s mother shared that she would not be a burden and offered for Anela to come home with her where there will be someone there 24 hours. April [name] is in agreement with this.    Review of Systems  Gastrointestinal:  Positive for abdominal pain.     Physical Exam Vitals and nursing note reviewed.  Constitutional:      General: She is not in acute distress. Cardiovascular:     Rate and Rhythm: Normal rate.  Abdominal:     General: There is distension.  Skin:    General: Skin is warm and dry.  Neurological:     Mental Status: She is alert and oriented to person, place, and time.             Vital Signs: BP (!) 169/113 (BP Location: Right Arm)   Pulse (!) 110   Temp (!) 97.2 F (36.2 C) (Oral)   Resp 19   Ht 5' 4 (1.626 m)    Wt 54.1 kg   LMP 01/07/2006   SpO2 97%   BMI 20.47 kg/m  SpO2: SpO2: 97 % O2 Device: O2 Device: Room Air O2 Flow Rate:    Intake/output summary:  Intake/Output Summary (Last 24 hours) at 10/06/2023 1053 Last data filed at 10/06/2023 9380 Gross per 24 hour  Intake 367.84 ml  Output 650 ml  Net -282.16 ml   LBM: Last BM Date :  (pt reports over a week ago) Baseline Weight: Weight: 53.4 kg Most recent weight: Weight: 54.1 kg       Palliative Assessment/Data: PPS: 20%      Patient Active Problem List   Diagnosis Date Noted   Dehydration 10/04/2023   Acute prerenal azotemia 10/04/2023   History of essential hypertension 10/04/2023   Anemia of chronic disease 10/04/2023   Intractable abdominal pain 10/03/2023   Palliative care encounter 09/22/2023   Uncontrolled pain 08/07/2023   Peritoneal carcinomatosis (HCC) 08/03/2023   Goals of care, counseling/discussion 08/03/2023   Constipation 08/03/2023   Intractable pain 08/03/2023   DNR (do not resuscitate) discussion 06/22/2023   ACP (advance care planning) 06/22/2023   Hypovolemia 06/21/2023   Rectal adenocarcinoma (HCC) 06/21/2023   Nausea & vomiting 06/21/2023   EKG, abnormal 06/21/2023   Counseling and coordination of care 06/21/2023   Malignant neoplasm metastatic to liver (HCC) 06/21/2023   Cancer associated pain 06/21/2023   Medication management 06/21/2023   Palliative care by specialist 06/21/2023   Accelerated hypertension 06/08/2023   Cancer of ascending colon metastatic to intra-abdominal lymph node (HCC) 06/07/2023   Hypertensive urgency 03/24/2023   Abdominal pain, chronic, right upper quadrant 03/09/2023   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   Upper abdominal pain 02/24/2023   Hyperproteinemia 01/24/2023   Elevated alkaline phosphatase level 01/24/2023   History of biliary stent insertion 12/31/2022   Intrahepatic bile duct dilation 12/31/2022   Imaging of  gastrointestinal tract abnormal 12/31/2022    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   Enterocolitis 12/26/2022   Bacteremia due to Klebsiella pneumoniae 12/26/2022   Hyponatremia 12/24/2022   Emphysema/COPD 12/16/2022   Ascending aortic aneurysm 12/16/2022   Diverticulosis of colon without hemorrhage 11/23/2022   Abnormal CT of the abdomen 11/21/2022   Cancer related pain 11/19/2022   Acute nonintractable headache 08/11/2022   History of ERCP 06/28/2022   Former smoker 04/30/2022   Intractable nausea and vomiting 04/16/2022   Protein-calorie malnutrition, severe 03/26/2022   Chronic constipation 03/26/2022   Metastatic colon cancer to liver (HCC) 03/24/2022   Obstructive jaundice 03/24/2022   Biliary stricture 03/24/2022   Biliary obstruction 03/23/2022   Hyperbilirubinemia 03/21/2022   Abnormal LFTs 03/21/2022   Kidney stone 08/01/2021   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   Sepsis (HCC)    Colorectal cancer, stage IV (HCC) 10/15/2020   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   Port-A-Cath in place 11/22/2019   High risk medication use 06/07/2019   Adnexal mass 04/09/2019   Malignant neoplasm of rectosigmoid (colon) (HCC) 04/08/2019   Hypertension 01/07/2018   Sinus tachycardia 01/07/2018   Hypokalemia 01/07/2018   Prolonged QT interval 01/07/2018   Hyperglycemia 01/07/2018   Benign essential hypertension 10/06/2017    Palliative Care Assessment & Plan    Assessment/Recommendations/Plan  Stage IV colon cancer- plan for symptom management/comfort focused care Continue Fentanyl  patch 200 mcg Q72hr Increase PCA bolus dose to 1.5mg  with 10 min lockout- fentanyl  was increased yesterday so she should get full benefit today- will add basal dose if needed Ondansetron  8mg  IV q8 hr (last QTc was 473) Continue dexamethasone  4mg  IV qAM Continue IV lorazepam  1mg  IV q4hr prn anxiety and nausea Continue NG tube for now- will  allow for clears per her request Continue Relistor - resume lactulose  when she has a BM   GOC- symptom management, but allowing her to be able to interact with family TOC order placed for hospice referral- plan to go to her mother's house with PCA Code status changed to DNR-limited  Code Status:   Code Status: Limited: Do not attempt resuscitation (DNR) -DNR-LIMITED -Do Not Intubate/DNI    Prognosis: Weeks-months  Discharge Planning: Home with Hospice  Care plan was discussed with   Thank you for allowing the Palliative Medicine Team to assist in the care of this patient.  Total time:  120 minutes Prolonged billing:  Time includes:   Preparing to see the patient (e.g., review of tests) Obtaining and/or reviewing separately obtained history Performing a medically necessary appropriate examination and/or evaluation Counseling and educating the patient/family/caregiver Ordering medications, tests, or procedures Referring and communicating with other health care professionals (when not reported separately) Documenting clinical information in the electronic or other health record Independently interpreting results (not reported separately) and communicating results to the patient/family/caregiver Care coordination (not reported separately) Clinical documentation  Cassondra Stain, AGNP-C Palliative Medicine   Please contact Palliative Medicine Team phone at [phone] for questions and concerns.

## 2023-10-06 NOTE — Plan of Care (Signed)

## 2023-10-06 NOTE — Hospital Course (Addendum)
 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 She has had multiple hospitalizations for similar especially throughout this year. Palliative care and oncology consulted on admission for assistance with pain management and ongoing goals of care discussions.  Assessment & Plan:   Intractable abdominal pain Cancer associated pain - History of stage IV colon cancer with peritoneal carcinomatosis - Uncontrolled pain at home prompting admission as similar to prior -Palliative care assisting with PCA pump management - ongoing GOC discussions as well    Nausea and vomiting - Continue Relistor  and octreotide  - Continue NG tube - Keep n.p.o.; ice chips okay - if long term NGT needed for de-compression, nutrition will become larger factor as will GOC   Hypertension - suspect partially pain associated as well - continue pain regimen as above - PRN BP meds avail as well  - Change clonidine  to patch   Hypokalemia -Replete as needed   Abnormal LFTs - Due to liver metastasis

## 2023-10-06 NOTE — Progress Notes (Signed)
 IP PROGRESS NOTE  Subjective:   April [name] developed nausea and vomiting yesterday afternoon.  An NG tube was placed and she was transferred to the stepdown unit.  She continues to have abdominal pain, abdominal distention, and constipation.  She does not feel better with the NG tube in place.  Objective: Vital signs in last 24 hours: Blood pressure (!) 169/113, pulse (!) 110, temperature (!) 97.2 F (36.2 C), temperature source Oral, resp. rate 19, height 5' 4 (1.626 m), weight 119 lb 4.3 oz (54.1 kg), last menstrual period 01/07/2006, SpO2 97%.  Intake/Output from previous day: 09/22 0701 - 09/23 0700 In: 387.8 [I.V.:387.8] Out: 650 [Emesis/NG output:650]  Physical Exam:  Abdomen: Distended, no discrete mass, diffuse tenderness, less firm than when I saw her 10/05/2023 Extremities: No leg edema   Portacath/PICC-without erythema  Lab Results: Recent Labs    10/04/23 0401 10/05/23 0500  WBC 6.9 7.1  HGB 9.7* 9.2*  HCT 31.3* 29.9*  PLT 272 257    BMET Recent Labs    10/05/23 0500 10/06/23 0501  NA 135 137  K 3.2* 3.7  CL 96* 98  CO2 28 26  GLUCOSE 89 101*  BUN 8 10  CREATININE 0.48 0.44  CALCIUM  8.4* 8.6*    Lab Results  Component Value Date   CEA1 190.0 (H) 09/22/2023   CEA 289.71 (H) 09/30/2023   CAN199 213 (H) 03/23/2022    Studies/Results: DG Abd 1 View Result Date: 10/05/2023 CLINICAL DATA:  NG tube placement EXAM: ABDOMEN - 1 VIEW COMPARISON:  Same day abdominal radiograph and prior studies FINDINGS: Interval placement of enteric tube with tip in side hole projecting over the body of the stomach. Paucity of visualized bowel gas limits evaluation. Partially imaged biliary stent. Bilateral nephrolithiasis. IMPRESSION: NG tube in place. Electronically Signed   By: Michaeline Blanch M.D.   On: 10/05/2023 15:22   DG Abd 1 View Result Date: 10/05/2023 CLINICAL DATA:  Abdominal pain, colon cancer EXAM: ABDOMEN - 1 VIEW COMPARISON:  None Available. FINDINGS:  Paucity of bowel gas. Large stool throughout the colon. Bilateral renal stones. Biliary stents, stable to prior. No acute osseous abnormality. Pelvic bones are partially visualized. IMPRESSION: Redemonstration of biliary stents. Bilateral nephrolithiasis. Paucity of bowel gas. Electronically Signed   By: Megan  Zare M.D.   On: 10/05/2023 13:59    Medications: I have reviewed the patient's current medications.  Assessment/Plan: Sigmoid colon cancer, stage IV (pT4a,pN2b,M1c) Colonoscopy 03/24/2019-3 rectal polyps-hyperplastic polyps, distal colon biopsy-at least intramucosal adenocarcinoma, completely obstructing mass in the distal sigmoid colon, could not be traversed, intact mismatch repair protein expression 03/24/2019-CEA 56.1 03/29/2019 CT abdomen/pelvis-circumferential thickening involving the entire mid and distal sigmoid colon to the level of the rectosigmoid junction, solid/cystic mass in the left ovary, bilateral nephrolithiasis Robotic assisted low anterior resection, mesenteric lymphadenectomy, bilateral salpingo-oophorectomy 04/08/2019 Pathology ([facility] review of outside pathology) metastatic adenocarcinoma involving the peritoneum overlying the round ligament, serosa of the urinary bladder, serosa of the right ureter a sacral area, pelvic peritoneum, and left ovary.  Omental biopsy with focal mucin pools with no carcinoma cells identified, right hemidiaphragm biopsy involved by metastatic adenocarcinoma, invasive adenocarcinoma the sigmoid colon, moderately differentiated, T4a, perineural and vascular invasion present, 7/22 lymph nodes, multiple tumor deposits, resection margins negative Negative for PD-L1, low probability of MSI-high, HER-2 negative, negative for BRAF, NRAS and KRAS alterations CTs 05/19/2019-no evidence of metastatic disease, findings suspicious for colitis of the transverse and ascending colon, small amount of ascites in the cul-de-sac, bilateral  renal calculi Cycle 1 FOLFIRI  05/24/2019, bevacizumab  added with cycle 2 Cycle 4 FOLFIRI/bevacizumab  07/05/2019 Cycle 5 FOLFIRI 07/27/2019, bevacizumab  held secondary to hypertension Cycle 6 FOLFIRI 08/10/1999, bevacizumab  held secondary to hypertension CTs 08/30/2019-no evidence of recurrent disease, new subsolid right lower lobe nodule felt to be inflammatory, emphysema Cycle 7 FOLFIRI 09/26/2019, bevacizumab  remains on hold secondary to hypertension Cycle 8 FOLFIRI 10/17/2019, bevacizumab  held secondary to hypertension, Udenyca  added for neutropenia Cycle 9 FOLFIRI 11/07/2019, bevacizumab  held secondary to hypertension, Udenyca  Cycle 10 FOLFIRI 11/28/2019, bevacizumab  held, Udenyca  Cycle 11 FOLFIRI 12/26/2019, bevacizumab  held, Udenyca  CTs 01/25/2020-no evidence of recurrent disease, resolution of right lower lobe nodule Maintenance Xeloda  beginning 02/06/2020 CTs 04/25/2020- no evidence of metastatic disease, multiple bilateral renal calculi without hydronephrosis Maintenance Xeloda  continued CT abdomen/pelvis without contrast 08/10/2020-bilateral staghorn renal calculi, no evidence of metastatic disease; addendum 08/23/2020-small but increasing omental nodules. Cycle 1 FOLFIRI/panitumumab  09/19/2020 Cycle 2 FOLFIRI/Panitumumab  10/03/2020 Cycle 3 FOLFIRI/Panitumumab  10/17/2020 Cycle 4 FOLFIRI/panitumumab  10/31/2020 Cycle 5 FOLFIRI/Panitumumab  11/14/2020 CTs 11/26/2020-stable omental metastases compared to 10/22/2020, mildly decreased from 08/10/2020.  Left lower quadrant tiny paracolic gutter implant slightly decreased from CT 08/10/2020.  No new or progressive metastatic disease in the abdomen or pelvis. Cycle 6 FOLFIRI/Panitumumab  11/28/2020, irinotecan  dose reduced, treatment schedule adjusted to every 3 weeks going forward Cycle 7 FOLFIRI/panitumumab  12/24/2020 Cycle 8 FOLFIRI/Panitumumab  01/16/2021--treatment held due to hypertension in the infusion area. Cycle 8 FOLFIRI/panitumumab  01/21/2021 Cycle 9 FOLFIRI/panitumumab   02/11/2021 Cycle 10 FOLFIRI/Panitumumab  03/04/2021 Cycle 11 FOLFIRI/Panitumumab  03/25/2021 CT abdomen/pelvis 04/10/2021-stable right omental and left posterior paracolic gutter nodules Cycle 12 FOLFIRI/panitumumab  04/15/2021 Cycle 13 FOLFIRI/panitumumab  05/06/2021 Cycle 14 FOLFIRI/Panitumumab  05/27/2021 Cycle 15 FOLFIRI/Panitumumab  06/17/2021 CT abdomen/pelvis 07/05/2021-slight enlargement of a dominant right omental implant, no new implants Patient requested a treatment break CT abdomen/pelvis 10/04/2021-mild increase in size of multiple omental soft tissue nodules, 4 mm calculus in the distal left ureter adjacent to the ureteral stent Cycle 1 Lonsurf  10/14/2021 10/28/2021 Avastin  every 2 weeks Cycle 2 Lonsurf  11/11/2021 Cycle 3 Lonsurf  12/09/2021 Cycle 4 Lonsurf  01/06/2022 Avastin  held 01/20/2022 due to proteinuria, 24-hour urine 43 mg CT/pelvis 01/30/2022-possible new peritoneal implant at the transverse colon, no significant change in other omental/peritoneal implants, stable chronic rectal wall thickening, status post removal of double-J left ureteral stent with no evidence of hydronephrosis or ureteral calculus, nonobstructing bilateral renal calculi Cycle 5 Lonsurf  02/05/2022 or 02/06/2022 Avastin  every 2 weeks Cycle 6 Lonsurf  03/03/2022 CTs 04/05/2022-increase in size and number of peritoneal implants compared to January 2024 central liver mass narrowing the anterior branch of the right portal vein, decreased biliary duct dilation, biliary stents in place, stable right cardiophrenic lymph nodes, separate pigtail stent in the lumen of the proximal duodenum Cycle 1 FOLFOX 04/14/2022 CT abdomen/pelvis 04/16/2022-stent within the third portion of the duodenum, stable peritoneal nodules, indwelling biliary stents with mild left biliary duct dilation Cycle 2 FOLFOX 04/28/2022, Emend and prophylactic dexamethasone  added Cycle 3 FOLFOX 05/12/2022, Aloxi , Emend, prophylactic dexamethasone , Compazine  and lorazepam  as  needed Chemotherapy held 05/25/2020 due to severe hypertension, evaluated in the emergency department Cycle 4 FOLFOX 06/03/2022 Cycle 5 FOLFOX 06/16/2022, oxaliplatin  dose reduced and 5-FU bolus eliminated due to neutropenia CT 06/25/2022-unchanged soft tissue at the hepatic hilum, unchanged, bile duct stent, decreased size of peritoneal and omental nodules and diminished volume of likely fluid in the lower left pelvis Cycle 6 FOLFOX 06/30/2022 Cycle 7 FOLFOX 07/21/2022 CT 07/24/2022 (in the emergency department with nausea, headache, abdominal pain)-no acute pathology.  No significant interval change in omental nodularity. Cycle 8 FOLFOX  08/04/2022, pruritus on the palms at the end of the oxaliplatin  infusion, Pepcid  and Benadryl  given, symptoms resolved, infusion not resumed Cycle 9 FOLFOX 08/18/2022-Pepcid  and Benadryl  added to premedications, Oxaliplatin  diluted in a larger volume and infusion time increased Cycle 10 FOLFOX 09/08/2022 Cycle 11 FOLFOX 09/22/2022 10/06/2022 patient declined treatment CTs 10/15/2022-similar soft tissue fullness in the hepatic hilum with biliary stents in place, decrease in omental/peritoneal metastases Maintenance capecitabine  10/26/2022 CT abdomen/pelvis 11/19/2022-mild increase in tumor studding at the left paracolic gutter and omentum, circumferential wall thickening of the rectum, biliary stents in place with minimal increase in intrahepatic biliary dilation, nonobstructive renal calculi Colonoscopy 11/23/2022-anastomosis at 15 cm from the anal verge-patent, 2 cm polypoid tissue at the anastomosis biopsied, invasive adenocarcinoma moderately differentiated, preserved expression of major MMR proteins CTs 12/15/2022: Possible developing nodule in the lateral left liver, increased omental nodularity, persistent wall thickening in the upper rectum CT abdomen/pelvis 12/24/2022: Increased thickened fold in the transverse colon-infectious/inflammatory, 2.1 cm lateral left liver lesion,  potential new 5 mm segment 5 lesion, stable biliary stents with intrahepatic biliary dilation, central biliary wall thickening near the hepatic hilum, multiple peritoneal/omental implants unchanged from 12/15/2022, but likely larger than 11/19/2022 Cycle 1 irinotecan /Panitumumab  01/06/2023 Cycle 2 irinotecan /Panitumumab  01/20/2023 CTs 01/24/2023-slight increase in omental nodularity. Cycle 3 irinotecan /Panitumumab  02/03/2023 Cycle 4 irinotecan /panitumumab  02/17/2023 CTs abdomen/pelvis 02/24/2023-subtly hypoattenuating lesion within the peripheral aspect of hepatic segment 2 redemonstrated.  Additional scattered subcentimeter low-density lesions within the liver too small to characterize.  Prominent intrahepatic biliary dilatation with biliary stents in place.  Similar degree of nodularity along the omentum and left paracolic gutter.  No ascites. CTs 03/09/2023-indwelling endoscopically placed stents; level of intrahepatic biliary duct dilatation is slightly improved on the left and some on the right.  Gallbladder is dilated with wall edema which is increasing.  More nodular tissue extending up along the porta hepatis.  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  Rectum with nodular wall thickening with extension into the adjacent tissue, nodular areas seen extending perirectal and presacral spaces.  Peritoneal carcinomatosis again identified. Irinotecan /Panitumumab  04/02/2023 Irinotecan /panitumumab  04/16/2023 Irinotecan /Panitumumab  05/07/2023 Irinotecan /Panitumumab  06/19/2023 CT abdomen/pelvis 06/21/2023-stable heterogeneous enhancement throughout the liver with nodularity along the surface of the liver.  Small amount of perihepatic ascites.  Stable nodularity right abdomen.  Circumferential rectal wall thickening/mass unchanged. Irinotecan /Panitumumab  discontinued 07/10/2023 due to lack of clinical benefit/poor tolerance Appointment with Dr. Sheilda 07/23/2023-FOLFOX, guardant testing CTs 08/02/2023-mid rectal soft  tissue mass, mildly progressive.  Right hepatic metastases, progressive.  Peritoneal disease/omental caking stable versus mildly progressive.  Small volume abdominopelvic ascites mildly progressive. Cycle 1 FOLFOX 08/05/2023 Cycle 2 FOLFOX 08/20/2023 (Pepcid  and Benadryl  daily x 2 days before treatment, Pepcid  continued for 2 days post oxaliplatin ; oxaliplatin  administration time increased) CTs 09/20/2023 grossly stable soft tissue mass within the mid rectum.  Stable cystic structure within the left lower pelvis, either loculated fluid, cystic peritoneal metastases or left adnexal cyst.  Interval development of mild left hydronephrosis and left hydroureter without clear cause for obstruction identified.  Stable peritoneal metastases and omental caking most pronounced in the right upper quadrant.  Diffuse metastatic disease about the right lobe of the liver subjectively worse since the prior exam.  Small volume ascites. Cycle 3 FOLFOX planned 10/01/2023 CT abdomen/pelvis 10/03/2023: Stable rectal soft tissue, stable peritoneal metastasis/omental caking, stable cystic collection in the left pelvis     2. Hypertension G4 P3, twins Kidney stones-bilateral staghorn renal calculi on CT 08/10/2020 Infected Port-A-Cath 09/12/2019-placed on Augmentin , referred for Port-A-Cath removal; Port-A-Cath removed  09/14/2019; PICC line placed 09/14/2019; culture staph aureus.  Course of Septra  completed. Neutropenia secondary to chemotherapy-Udenyca  added with cycle 8 FOLFIRI Right nephrostomy tube 09/12/2020; stent placed 10/09/2020, percutaneous nephrostolithotomy treatment of right-sided kidney stones 10/09/2020, malpositioned right ureter stent replacement 10/23/2020, right ureter stent removed 10/29/2020 Percutaneous left nephrostolithotomy 10/01/2021-no renal stone identified, left ureter stent left in place Cystoscopy 10/14/2021-stent removed 8.  Admission 03/21/2022 with new onset jaundice, abdominal pain, nausea MRI abdomen  03/22/2022-central liver mass with obstruction at the confluence of the intrahepatic ducts and proximal common hepatic duct with severe Intermatic biliary ductal dilatation, progressive peritoneal carcinomatosis ERCP 03/24/2022-severe biliary stricture in the hepatic duct affecting the left and right hepatic duct, common hepatic duct, and bifurcation, malignant appearing, temporary plastic pancreatic stent, left and right hepatic duct stents were placed ERCP 02/25/2018 25-2 visibly patent stents from the biliary tree were seen in the major papilla, removed.  2 moderate biliary strictures found in the hepatic duct system, malignant appearing.  Left and right hepatic duct and all intrahepatic branches were moderately dilated secondary to the strictures.  The strictures were dilated.  Plastic stent placed into the left hepatic duct and right hepatic duct. 9.  Admission 04/16/2022 with nausea/vomiting and abdominal pain, felt to be acute toxicity related to chemotherapy, discharged home 04/18/2022 10.  Admission 11/19/2022 with nausea/vomiting and abdominal pain-improved 11.  Admission 12/15/2022 with fever and right submandibular swelling/tenderness CT neck 12/15/2022: 2 mm distal right submandibular duct calculus with mild inflammatory change adjacent to the right submandibular gland 12.  Admission 12/24/2022 with a fever nausea/vomiting, diarrhea, Klebsiella bacteremia completed course of Augmentin  13.  Admission with abdominal pain and nausea/vomiting 03/09/2023 through 03/14/2023-concern for gallbladder inflammation.  Not felt to be a surgical candidate.  Percutaneous cholecystostomy tube placed 03/11/2023.  Tube removed 04/24/2023. 14.  Admission 06/20/2023 with abdominal pain and constipation, pain improved partially following a bowel movement 15.  Admission 08/02/2023 with abdominal pain, cycle 1 salvage FOLFOX 08/05/2023 16.  Admission 09/20/2023 with nausea and abdominal pain 17.  Admission 10/03/2023 with nausea and  abdominal pain 10/05/2023 NG tube placed secondary to recurrent nausea and vomiting   April [name] has metastatic colon cancer.  She has developed progressive metastatic disease despite multiple systemic treatment regimens.  She has completed 3 cycles of salvage therapy with FOLFOX.  Her clinical status has declined while on FOLFOX and the CEA is higher.  I reviewed the admission CT images.  She does not have significant ascites.  The abdominal distention appears to be related to omental tumor caking.  She has been admitted repeatedly with abdominal pain, nausea, and constipation.  Her symptoms are very likely related to carcinomatosis.  Her pain has not been adequately controlled with oral narcotics.  She is scheduled for a lumbar infusion pump next month.  She has persistent abdominal pain and developed nausea and vomiting yesterday.  She remains constipated.  An NG tube was placed yesterday.  Her symptoms are likely related to progressive carcinomatosis with a functional ileus.  I discussed the difficult current situation and poor prognosis with April [name].  I explained the limited treatment options for the metastatic colon cancer.  I recommend hospice care.  We discussed home hospice and placement with hospice.  She understands she will not be able to receive further chemotherapy while enrolled in hospice care.  April [name] would like to discuss options with her family prior to committing to hospice care.   Recommendations: 1.  Continue narcotic analgesics per palliative care  medicine  2.  Bowel regimen, trial of clamping NG tube 3.  Hypertension per the medical service 4.  Continue goals of care care and hospice discussion  LOS: 2 days   Arley Hof, MD   10/06/2023, 8:08 AM

## 2023-10-06 NOTE — Progress Notes (Signed)
   10/06/23 1435  Spiritual Encounters  Type of Visit Declined chaplain visit  Conversation partners present during encounter Nurse  Reason for visit Routine spiritual support  OnCall Visit No   Patient decline visit stating she would let nurse know if she changes her mind.

## 2023-10-06 NOTE — Progress Notes (Signed)
 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 She has had multiple hospitalizations for similar especially throughout this year. Palliative care and oncology consulted on admission for assistance with pain management and ongoing goals of care discussions.  Assessment & Plan:   Intractable abdominal pain Cancer associated pain - History of stage IV colon cancer with peritoneal carcinomatosis - Uncontrolled pain at home prompting admission as similar to prior -Palliative care assisting with PCA pump management - ongoing GOC discussions as well    Nausea and vomiting - Continue Relistor  and octreotide  - Continue NG tube - Keep n.p.o.; ice chips okay - if long term NGT needed for de-compression, nutrition will become larger factor as will GOC   Hypertension - suspect partially pain associated as well - continue pain regimen as above - PRN BP meds avail as well  - Change clonidine  to patch   Hypokalemia -Replete as needed   Abnormal LFTs - Due to liver metastasis  Interval History:  Still complaining of abdominal pain this morning.  Has been using Dilaudid  PCA consistently. NG tube in place and we discussed continuing for now while ongoing decompression.   Old records reviewed in assessment of this patient  Antimicrobials:   DVT prophylaxis:  SCDs Start: 10/03/23 2004   Code Status:   Code Status: Do not attempt resuscitation (DNR) - Comfort care  Mobility Assessment (Last 72 Hours)     Mobility Assessment     Row Name 10/06/23 0941  10/05/23 2000 10/05/23 1445 10/04/23 2025 10/04/23 1100   Does the patient have exclusion criteria? Yes- Hold (Level 0) - Assessment complete No - Perform mobility assessment No - Perform mobility assessment No - Perform mobility assessment No - Perform mobility assessment   What is the highest level of mobility based on the mobility assessment? -- -- Level 5 (Ambulates independently) - Balance while walking independently - Complete Level 5 (Ambulates independently) - Balance while walking independently - Complete Level 5 (Ambulates independently) - Balance while walking independently - Complete    Row Name 10/04/23 0754 10/03/23 2130         Does the patient have exclusion criteria? No - Perform mobility assessment No - Perform mobility assessment      What is the highest level of mobility based on the mobility assessment? Level 5 (Ambulates independently) - Balance while walking independently - Complete Level 5 (Ambulates independently) - Balance while walking independently - Complete         Barriers to discharge:  Disposition Plan: Home HH orders placed: TBD Status is: Inpatient  Objective: Blood pressure (!) 157/121, pulse 88, temperature 97.8 F (36.6 C), temperature source Oral, resp. rate 12, height 5' 4 (1.626 m), weight 54.1 kg, last menstrual period 01/07/2006, SpO2 100%.  Examination:  Physical Exam Constitutional:      Comments: Pleasant cachectic appearing woman resting in bed in no distress but appears uncomfortable from pain  HENT:     Head: Normocephalic and atraumatic.     Nose:     Comments: NG tube in place    Mouth/Throat:  Mouth: Mucous membranes are dry.  Eyes:     Extraocular Movements: Extraocular movements intact.  Cardiovascular:     Rate and Rhythm: Normal rate and regular rhythm.  Pulmonary:     Effort: Pulmonary effort is normal.     Breath sounds: Normal breath sounds. No wheezing or rales.  Abdominal:     General: There is no distension.      Palpations: Abdomen is soft.     Tenderness: There is abdominal tenderness.  Musculoskeletal:        General: Normal range of motion.     Cervical back: Normal range of motion and neck supple.     Right lower leg: No edema.     Left lower leg: No edema.  Skin:    General: Skin is warm and dry.  Neurological:     General: No focal deficit present.  Psychiatric:        Mood and Affect: Mood normal.      Consultants:  Oncology Palliative care  Procedures:    Data Reviewed: Results for orders placed or performed during the hospital encounter of 10/03/23 (from the past 24 hours)  Troponin T, High Sensitivity     Status: None   Collection Time: 10/05/23  1:56 PM  Result Value Ref Range   Troponin T High Sensitivity <15 0 - 19 ng/L  Troponin T, High Sensitivity     Status: None   Collection Time: 10/05/23  8:10 PM  Result Value Ref Range   Troponin T High Sensitivity <15 0 - 19 ng/L  Renal function panel     Status: Abnormal   Collection Time: 10/06/23  5:01 AM  Result Value Ref Range   Sodium 137 135 - 145 mmol/L   Potassium 3.7 3.5 - 5.1 mmol/L   Chloride 98 98 - 111 mmol/L   CO2 26 22 - 32 mmol/L   Glucose, Bld 101 (H) 70 - 99 mg/dL   BUN 10 6 - 20 mg/dL   Creatinine, Ser 9.55 0.44 - 1.00 mg/dL   Calcium  8.6 (L) 8.9 - 10.3 mg/dL   Phosphorus 3.8 2.5 - 4.6 mg/dL   Albumin 3.1 (L) 3.5 - 5.0 g/dL   GFR, Estimated >39 >39 mL/min   Anion gap 13 5 - 15  Magnesium      Status: None   Collection Time: 10/06/23  5:01 AM  Result Value Ref Range   Magnesium  2.0 1.7 - 2.4 mg/dL   *Note: Due to a large number of results and/or encounters for the requested time period, some results have not been displayed. A complete set of results can be found in Results Review.    I have reviewed pertinent nursing notes, vitals, labs, and images as necessary. I have ordered labwork to follow up on as indicated.  I have reviewed the last notes from staff over past 24 hours. I have discussed  patient's care plan and test results with nursing staff, CM/SW, and other staff as appropriate.  Time spent: Greater than 50% of the 55 minute visit was spent in counseling/coordination of care for the patient as laid out in the A&P.   LOS: 2 days   Alm Apo, MD [facility] 10/06/2023, 12:48 PM

## 2023-10-07 ENCOUNTER — Encounter (HOSPITAL_COMMUNITY): Payer: Self-pay | Admitting: Certified Registered Nurse Anesthetist

## 2023-10-07 DIAGNOSIS — C19 Malignant neoplasm of rectosigmoid junction: Secondary | ICD-10-CM | POA: Diagnosis not present

## 2023-10-07 DIAGNOSIS — R109 Unspecified abdominal pain: Secondary | ICD-10-CM | POA: Diagnosis not present

## 2023-10-07 DIAGNOSIS — K567 Ileus, unspecified: Secondary | ICD-10-CM

## 2023-10-07 DIAGNOSIS — G893 Neoplasm related pain (acute) (chronic): Secondary | ICD-10-CM

## 2023-10-07 DIAGNOSIS — N19 Unspecified kidney failure: Secondary | ICD-10-CM | POA: Diagnosis not present

## 2023-10-07 DIAGNOSIS — Z515 Encounter for palliative care: Secondary | ICD-10-CM | POA: Diagnosis not present

## 2023-10-07 DIAGNOSIS — E43 Unspecified severe protein-calorie malnutrition: Secondary | ICD-10-CM | POA: Diagnosis not present

## 2023-10-07 MED ORDER — HYDROMORPHONE 1 MG/ML IV SOLN
INTRAVENOUS | Status: DC
  Administered 2023-10-07: 30 mg via INTRAVENOUS
  Administered 2023-10-07: 21.4 mg via INTRAVENOUS
  Administered 2023-10-08 (×3): 30 mg via INTRAVENOUS
  Administered 2023-10-08: 13.89 mg via INTRAVENOUS
  Administered 2023-10-08: 8.61 mg via INTRAVENOUS
  Administered 2023-10-08: 4.9 mg via INTRAVENOUS
  Administered 2023-10-08: 7.6 mg via INTRAVENOUS
  Administered 2023-10-08: 8.09 mg via INTRAVENOUS
  Administered 2023-10-08: 2.84 mg via INTRAVENOUS
  Administered 2023-10-09 (×2): 30 mg via INTRAVENOUS
  Administered 2023-10-09: 14.28 mg via INTRAVENOUS
  Administered 2023-10-09: 10.48 mg via INTRAVENOUS
  Administered 2023-10-09: 30 mg via INTRAVENOUS
  Administered 2023-10-09: 9.21 mg via INTRAVENOUS
  Administered 2023-10-10: 30 mg via INTRAVENOUS
  Administered 2023-10-10: 8.47 mg via INTRAVENOUS
  Administered 2023-10-10: 30 mg via INTRAVENOUS
  Administered 2023-10-10: 12.42 mg via INTRAVENOUS
  Filled 2023-10-07 (×9): qty 30

## 2023-10-07 NOTE — Progress Notes (Signed)
 [facility]                                                                              April [name], is a 48 y.o. 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 She has had multiple hospitalizations for similar especially throughout this year. Palliative medicine, oncology following for assistance with pain management and ongoing GOC discussions.   Assessment & Plan      Intractable abdominal pain Cancer associated pain - History of stage IV colon cancer with peritoneal carcinomatosis - Uncontrolled pain at home prompting admission as similar to prior - Currently on Dilaudid  PCA - palliative medicine assisting with management and ongoing GOC discussions.  ell    Nausea and vomiting - Continue Relistor  and octreotide  - Continue NG tube -Continue clear liquid diet and advance as tolerated - if long term NGT needed for de-compression, nutrition will become larger factor as will GOC   Hypertension - suspect partially pain associated as well - continue pain regimen as above - PRN BP meds avail as well  - Continue clonidine  patch    Hypokalemia -Replete as needed   Abnormal LFTs, stage IV colon cancer - Due to liver metastasis, on octreotide     Constipation - Continue Relistor , no BM yet, palliative medicine following     Estimated body mass index is 20.47 kg/m as calculated from the following:   Height as of this encounter: 5' 4 (1.626 m).   Weight as of this  encounter: 54.1 kg.  Code Status: DNR DVT Prophylaxis:  SCDs Start: 10/03/23 2004   Level of Care: Level of care: Stepdown Family Communication:  Disposition Plan:      Remains inpatient appropriate:   Not stable for DC   Procedures:    Consultants:   Palliative medicine, oncology  Antimicrobials:   Anti-infectives (From admission, onward)    None          Medications  Chlorhexidine  Gluconate Cloth  6 each Topical Daily   cloNIDine   0.3 mg Transdermal Weekly   dexamethasone  (DECADRON ) injection  4 mg Intravenous q morning   fentaNYL   2 patch Transdermal Q72H   HYDROmorphone    Intravenous Q4H   methylnaltrexone   8 mg Subcutaneous Q24H   metoprolol  tartrate  10 mg Intravenous Q6H   sodium chloride  flush  10-40 mL Intracatheter Q12H      Subjective:   April [name] was seen and examined today.  Sitting upright, NG tube in, appears somewhat uncomfortable.  Constipation +, abdominal pain+  Objective:   Vitals:   10/07/23 0500 10/07/23 0600 10/07/23 0751 10/07/23 0835  BP: (!) 146/115 ROLLEN)  152/116    Pulse: 99 (!) 106    Resp:   12 11  Temp:   97.9 F (36.6 C)   TempSrc:   Oral   SpO2: 100% 97% 100% 97%  Weight:      Height:        Intake/Output Summary (Last 24 hours) at 10/07/2023 1204 Last data filed at 10/07/2023 0940 Gross per 24 hour  Intake 1693.42 ml  Output 4900 ml  Net -3206.58 ml     Wt Readings from Last 3 Encounters:  10/05/23 54.1 kg  09/30/23 51.8 kg  09/20/23 54.3 kg     Exam General: Alert and oriented x 3, NAD, NG tube+ Cardiovascular: S1 S2 auscultated,  RRR Respiratory: Clear to auscultation bilaterally, no wheezing Gastrointestinal: Diffuse TTP  Ext: no pedal edema bilaterally Neuro: No new deficits Psych: Normal affect     Data Reviewed:  I have personally reviewed following labs    CBC Lab Results  Component Value Date   WBC 7.1 10/05/2023   RBC 3.39 (L) 10/05/2023   HGB 9.2 (L) 10/05/2023   HCT 29.9  (L) 10/05/2023   MCV 88.2 10/05/2023   MCH 27.1 10/05/2023   PLT 257 10/05/2023   MCHC 30.8 10/05/2023   RDW 17.0 (H) 10/05/2023   LYMPHSABS 1.6 10/05/2023   MONOABS 0.2 10/05/2023   EOSABS 0.1 10/05/2023   BASOSABS 0.0 10/05/2023     Last metabolic panel Lab Results  Component Value Date   NA 137 10/06/2023   K 3.7 10/06/2023   CL 98 10/06/2023   CO2 26 10/06/2023   BUN 10 10/06/2023   CREATININE 0.44 10/06/2023   GLUCOSE 101 (H) 10/06/2023   GFRNONAA >60 10/06/2023   GFRAA >60 10/17/2019   CALCIUM  8.6 (L) 10/06/2023   PHOS 3.8 10/06/2023   PROT 7.2 10/04/2023   ALBUMIN 3.1 (L) 10/06/2023   BILITOT 1.2 10/04/2023   ALKPHOS 617 (H) 10/04/2023   AST 149 (H) 10/04/2023   ALT 75 (H) 10/04/2023   ANIONGAP 13 10/06/2023    CBG (last 3)  No results for input(s): GLUCAP in the last 72 hours.    Coagulation Profile: No results for input(s): INR, PROTIME in the last 168 hours.   Radiology Studies: I have personally reviewed the imaging studies  DG Abd 1 View Result Date: 10/05/2023 CLINICAL DATA:  NG tube placement EXAM: ABDOMEN - 1 VIEW COMPARISON:  Same day abdominal radiograph and prior studies FINDINGS: Interval placement of enteric tube with tip in side hole projecting over the body of the stomach. Paucity of visualized bowel gas limits evaluation. Partially imaged biliary stent. Bilateral nephrolithiasis. IMPRESSION: NG tube in place. Electronically Signed   By: Michaeline Blanch M.D.   On: 10/05/2023 15:22   DG Abd 1 View Result Date: 10/05/2023 CLINICAL DATA:  Abdominal pain, colon cancer EXAM: ABDOMEN - 1 VIEW COMPARISON:  None Available. FINDINGS: Paucity of bowel gas. Large stool throughout the colon. Bilateral renal stones. Biliary stents, stable to prior. No acute osseous abnormality. Pelvic bones are partially visualized. IMPRESSION: Redemonstration of biliary stents. Bilateral nephrolithiasis. Paucity of bowel gas. Electronically Signed   By: Megan  Zare M.D.    On: 10/05/2023 13:59       Teondra Newburg M.D. [facility] 10/07/2023, 12:04 PM  Available via Epic secure chat 7am-7pm After 7 pm, please refer to night coverage provider listed on amion.

## 2023-10-07 NOTE — Plan of Care (Signed)
  Problem: Coping: Goal: Level of anxiety will decrease Outcome: Progressing   Problem: Elimination: Goal: Will not experience complications related to urinary retention Outcome: Progressing   Problem: Safety: Goal: Ability to remain free from injury will improve Outcome: Progressing   Problem: Nutrition: Goal: Adequate nutrition will be maintained Outcome: Not Progressing   Problem: Elimination: Goal: Will not experience complications related to bowel motility Outcome: Not Progressing   Problem: Pain Managment: Goal: General experience of comfort will improve and/or be controlled Outcome: Not Progressing

## 2023-10-07 NOTE — Progress Notes (Signed)
 Daily Progress Note   Patient Name: April [name]       Date: 10/07/2023 DOB: 03-03-1975  Age: 48 y.o. MRN#: [id number] Attending Physician: Davia Nydia POUR, MD Primary Care Physician: Oris Camie BRAVO, NP Admit Date: 10/03/2023  Reason for Consultation/Follow-up: Establishing goals of care and Pain control  Patient Profile/HPI:    48 y.o. 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 Palliative medicine consulted for medical decision making assistance and symptom management.    Subjective: Chart reviewed including labs, progress notes, imaging from this and previous encounters.  She has utilized 56.5mg  prn IV dilaudid  over last 24 hours- with 200mcg Fentanyl  equals 1106 OME.  Appears in better spirits today. Awake, alert, oriented. Family is at bedside.  She feels pain is improved, but still feels it can be better controlled. She is awake, alert, oriented and interactive. Has not had a bowel movement. She declined Relistor  injection earlier, but is agreeable to it now. We discussed options if we cannot get her bowels moving. She really wants to eat. In order to allow this to happen comfortably we discussed possible placement of a venting PEG tube. We discussed that if we can't get bowels moving and she requires venting PEG, then anything she eats will likely not be absorbed and provide adequate nutrition and this would also likely shorten her prognosis. She is understanding. For now she is hopeful that relistor  will get bowels moving again.  She is also having difficulty  voiding. She reports that it takes time to get urine to release and she doesn't think she's putting out a great deal. I discussed in and out catheterization with her. She wants to avoid that for now.  Her mother requested to speak with me outside room. She inquired about Hospice arrangements. Patient's children are unaware of hospice plan - patient plans to discuss with them.  Discussed with bedside RN and with care manager.   Review of Systems  Gastrointestinal:  Positive for abdominal pain and constipation.  Genitourinary:        Retention     Physical Exam Vitals and nursing note reviewed.  HENT:  Nose:     Comments: NG tube in place Cardiovascular:     Rate and Rhythm: Normal rate.  Pulmonary:     Effort: Pulmonary effort is normal.  Abdominal:     General: There is distension.  Skin:    General: Skin is warm and dry.  Neurological:     Mental Status: She is alert and oriented to person, place, and time.             Vital Signs: BP (!) 171/130   Pulse 86   Temp 97.9 F (36.6 C) (Oral)   Resp 11   Ht 5' 4 (1.626 m)   Wt 54.1 kg   LMP 01/07/2006   SpO2 99%   BMI 20.47 kg/m  SpO2: SpO2: 99 % O2 Device: O2 Device: Nasal Cannula O2 Flow Rate: O2 Flow Rate (L/min): 2 L/min  Intake/output summary:  Intake/Output Summary (Last 24 hours) at 10/07/2023 1233 Last data filed at 10/07/2023 0940 Gross per 24 hour  Intake 1693.42 ml  Output 4900 ml  Net -3206.58 ml   LBM: Last BM Date :  (PTA) Baseline Weight: Weight: 53.4 kg Most recent weight: Weight: 54.1 kg       Palliative Assessment/Data: PPS: 20%      Patient Active Problem List   Diagnosis Date Noted   Dehydration 10/04/2023   Acute prerenal azotemia 10/04/2023   History of essential hypertension 10/04/2023   Anemia of chronic disease 10/04/2023   Intractable abdominal pain 10/03/2023   Palliative care encounter 09/22/2023   Uncontrolled pain 08/07/2023   Peritoneal carcinomatosis (HCC)  08/03/2023   Goals of care, counseling/discussion 08/03/2023   Constipation 08/03/2023   Intractable pain 08/03/2023   DNR (do not resuscitate) discussion 06/22/2023   ACP (advance care planning) 06/22/2023   Hypovolemia 06/21/2023   Rectal adenocarcinoma (HCC) 06/21/2023   Nausea & vomiting 06/21/2023   EKG, abnormal 06/21/2023   Counseling and coordination of care 06/21/2023   Malignant neoplasm metastatic to liver (HCC) 06/21/2023   Cancer associated pain 06/21/2023   Medication management 06/21/2023   Palliative care by specialist 06/21/2023   Accelerated hypertension 06/08/2023   Cancer of ascending colon metastatic to intra-abdominal lymph node (HCC) 06/07/2023   Hypertensive urgency 03/24/2023   Abdominal pain, chronic, right upper quadrant 03/09/2023   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   Upper abdominal pain 02/24/2023   Hyperproteinemia 01/24/2023   Elevated alkaline phosphatase level 01/24/2023   History of biliary stent insertion 12/31/2022   Intrahepatic bile duct dilation 12/31/2022   Imaging of gastrointestinal tract abnormal 12/31/2022   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   Enterocolitis 12/26/2022   Bacteremia due to Klebsiella pneumoniae 12/26/2022   Hyponatremia 12/24/2022   Emphysema/COPD 12/16/2022   Ascending aortic aneurysm 12/16/2022   Diverticulosis of colon without hemorrhage 11/23/2022   Abnormal CT of the abdomen 11/21/2022   Cancer related pain 11/19/2022   Acute nonintractable headache 08/11/2022   History of ERCP 06/28/2022   Former smoker 04/30/2022   Intractable nausea and vomiting 04/16/2022   Protein-calorie malnutrition, severe 03/26/2022   Chronic constipation 03/26/2022   Metastatic colon cancer to liver (HCC) 03/24/2022   Obstructive jaundice 03/24/2022   Biliary stricture 03/24/2022   Biliary obstruction 03/23/2022   Hyperbilirubinemia 03/21/2022   Abnormal LFTs 03/21/2022   Kidney stone 08/01/2021   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   Sepsis (HCC)    Colorectal cancer, stage IV (HCC) 10/15/2020   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   Port-A-Cath in place 11/22/2019   High risk medication use 06/07/2019   Adnexal mass 04/09/2019   Malignant neoplasm of rectosigmoid (colon) (HCC) 04/08/2019   Hypertension 01/07/2018   Sinus tachycardia 01/07/2018   Hypokalemia 01/07/2018   Prolonged QT interval 01/07/2018   Hyperglycemia 01/07/2018   Benign essential hypertension 10/06/2017    Palliative Care Assessment & Plan    Assessment/Recommendations/Plan  Stage IV colon cancer- plan for symptom management/comfort focused care Continue Fentanyl  patch 200 mcg Q72hr Continue PCA bolus dose to 1.5mg  with 10 min lockout Start Basal rate hydromorphone  via PCA 2mg  Continue Ondansetron  8mg  IV q8 hr (last QTc was 473) for 24 hr then change to ODT prn Continue dexamethasone  4mg  IV qAM Continue IV lorazepam  1mg  IV q4hr prn anxiety and nausea Continue NG tube for now- will allow for clears per her request Continue Relistor - resume lactulose  when she has a BM- if she doesn't have a BM then will need to consider venting PEG for comfort - we discussed if she is unable to have a BM then and venting PEG required this will affect her nutrition and shorten her prognosis  GOC- symptom management, but allowing her to be able to interact with family TOC order placed for hospice referral- plan to go to her mother's house with PCA pump in place Code status changed to DNR-limited     Code Status:   Code Status: Do not attempt resuscitation (DNR) - Comfort care   Prognosis:  < 3 months  Discharge Planning: Home with Hospice  Care plan was discussed with patient and bedside RN.   Thank you for allowing the Palliative Medicine Team to assist in the care of this patient.  Total time:  Prolonged billing:  Time  includes:   Preparing to see the patient (e.g., review of tests) Obtaining and/or reviewing separately obtained history Performing a medically necessary appropriate examination and/or evaluation Counseling and educating the patient/family/caregiver Ordering medications, tests, or procedures Referring and communicating with other health care professionals (when not reported separately) Documenting clinical information in the electronic or other health record Independently interpreting results (not reported separately) and communicating results to the patient/family/caregiver Care coordination (not reported separately) Clinical documentation  Cassondra Stain, AGNP-C Palliative Medicine   Please contact Palliative Medicine Team phone at [phone] for questions and concerns.

## 2023-10-07 NOTE — TOC Progression Note (Addendum)
 Transition of Care [facility]) - Progression Note    Patient Details  Name: April [name] MRN: [id number] Date of Birth: 07-Apr-1975  [facility]) CM/SW Contact  Jon ONEIDA Anon, RN Phone Number: 10/07/2023, 1:17 PM  Clinical Narrative:    ICM consulted for hospice referral. NCM sent referral for home hospice to [facility], hospital liaison with [facility] and she accepts referral. Plan is for pt to return home with hospice care services. Pt mother is POC. ICM continuing to follow to assist with any DC needs.   Expected Discharge Plan: Home w Hospice Care Barriers to Discharge: No Barriers Identified, Continued Medical Work up               Expected Discharge Plan and Services In-house Referral: Hospice / Palliative Care Discharge Planning Services: CM Consult Post Acute Care Choice: Hospice Living arrangements for the past 2 months: Single Family Home                 DME Arranged: N/A DME Agency: NA       HH Arranged: NA HH Agency: Other - See comment Photographer)         Social Drivers of Health (SDOH) Interventions SDOH Screenings   Food Insecurity: No Food Insecurity (10/03/2023)  Housing: Low Risk  (10/03/2023)  Transportation Needs: No Transportation Needs (10/03/2023)  Utilities: Not At Risk (10/03/2023)  Alcohol Screen: Low Risk  (11/11/2022)  Depression (PHQ2-9): Low Risk  (09/30/2023)  Financial Resource Strain: Low Risk  (11/11/2022)  Physical Activity: Inactive (11/11/2022)  Social Connections: Socially Isolated (01/24/2023)  Stress: No Stress Concern Present (11/11/2022)  Tobacco Use: Medium Risk (10/04/2023)  Health Literacy: Adequate Health Literacy (11/11/2022)    Readmission Risk Interventions    10/05/2023   11:32 AM 08/12/2023    1:51 PM 06/23/2023    3:12 PM  Readmission Risk Prevention Plan  Transportation Screening Complete Complete Complete  Medication Review Oceanographer) Complete Complete  Complete  PCP or Specialist appointment within 3-5 days of discharge Complete Complete Complete  HRI or Home Care Consult Complete Complete Complete  SW Recovery Care/Counseling Consult Complete Complete Complete  Palliative Care Screening Complete Complete Not Applicable  Skilled Nursing Facility Not Applicable Not Applicable Not Applicable

## 2023-10-07 NOTE — Progress Notes (Signed)
 IP PROGRESS NOTE  Subjective:   [name] reports improvement in the abdominal distention since the NG tube was placed.  Abdominal pain is improved today.  No bowel movement or flatus.  [name] met with palliative care yesterday and agreed to home hospice care.  Objective: Vital signs in last 24 hours: Blood pressure (!) 171/130, pulse 86, temperature (!) 97.5 F (36.4 C), temperature source Oral, resp. rate 12, height 5' 4 (1.626 m), weight 119 lb 4.3 oz (54.1 kg), last menstrual period 01/07/2006, SpO2 99%.  Intake/Output from previous day: 09/23 0701 - 09/24 0700 In: 1676.8 [P.O.:960; I.V.:563.1; IV Piggyback:153.7] Out: 3600 [Urine:650; Emesis/NG output:2950]  Physical Exam: HEENT: NG tube in place, no thrush Abdomen: Distended, less firm, no discrete mass Extremities: No leg edema   Portacath/PICC-without erythema  Lab Results: Recent Labs    10/05/23 0500  WBC 7.1  HGB 9.2*  HCT 29.9*  PLT 257    BMET Recent Labs    10/05/23 0500 10/06/23 0501  NA 135 137  K 3.2* 3.7  CL 96* 98  CO2 28 26  GLUCOSE 89 101*  BUN 8 10  CREATININE 0.48 0.44  CALCIUM  8.4* 8.6*    Lab Results  Component Value Date   CEA1 190.0 (H) 09/22/2023   CEA 289.71 (H) 09/30/2023   CAN199 213 (H) 03/23/2022    Studies/Results: DG Abd 1 View Result Date: 10/05/2023 CLINICAL DATA:  NG tube placement EXAM: ABDOMEN - 1 VIEW COMPARISON:  Same day abdominal radiograph and prior studies FINDINGS: Interval placement of enteric tube with tip in side hole projecting over the body of the stomach. Paucity of visualized bowel gas limits evaluation. Partially imaged biliary stent. Bilateral nephrolithiasis. IMPRESSION: NG tube in place. Electronically Signed   By: Michaeline Blanch M.D.   On: 10/05/2023 15:22    Medications: I have reviewed the patient's current medications.  Assessment/Plan: Sigmoid colon cancer, stage IV (pT4a,pN2b,M1c) Colonoscopy 03/24/2019-3 rectal  polyps-hyperplastic polyps, distal colon biopsy-at least intramucosal adenocarcinoma, completely obstructing mass in the distal sigmoid colon, could not be traversed, intact mismatch repair protein expression 03/24/2019-CEA 56.1 03/29/2019 CT abdomen/pelvis-circumferential thickening involving the entire mid and distal sigmoid colon to the level of the rectosigmoid junction, solid/cystic mass in the left ovary, bilateral nephrolithiasis Robotic assisted low anterior resection, mesenteric lymphadenectomy, bilateral salpingo-oophorectomy 04/08/2019 Pathology ([facility] review of outside pathology) metastatic adenocarcinoma involving the peritoneum overlying the round ligament, serosa of the urinary bladder, serosa of the right ureter a sacral area, pelvic peritoneum, and left ovary.  Omental biopsy with focal mucin pools with no carcinoma cells identified, right hemidiaphragm biopsy involved by metastatic adenocarcinoma, invasive adenocarcinoma the sigmoid colon, moderately differentiated, T4a, perineural and vascular invasion present, 7/22 lymph nodes, multiple tumor deposits, resection margins negative Negative for PD-L1, low probability of MSI-high, HER-2 negative, negative for BRAF, NRAS and KRAS alterations CTs 05/19/2019-no evidence of metastatic disease, findings suspicious for colitis of the transverse and ascending colon, small amount of ascites in the cul-de-sac, bilateral renal calculi Cycle 1 FOLFIRI 05/24/2019, bevacizumab  added with cycle 2 Cycle 4 FOLFIRI/bevacizumab  07/05/2019 Cycle 5 FOLFIRI 07/27/2019, bevacizumab  held secondary to hypertension Cycle 6 FOLFIRI 08/10/1999, bevacizumab  held secondary to hypertension CTs 08/30/2019-no evidence of recurrent disease, new subsolid right lower lobe nodule felt to be inflammatory, emphysema Cycle 7 FOLFIRI 09/26/2019, bevacizumab  remains on hold secondary to hypertension Cycle 8 FOLFIRI 10/17/2019, bevacizumab  held secondary to hypertension, Udenyca  added for  neutropenia Cycle 9 FOLFIRI 11/07/2019, bevacizumab  held secondary to hypertension, Udenyca  Cycle 10  FOLFIRI 11/28/2019, bevacizumab  held, Udenyca  Cycle 11 FOLFIRI 12/26/2019, bevacizumab  held, Udenyca  CTs 01/25/2020-no evidence of recurrent disease, resolution of right lower lobe nodule Maintenance Xeloda  beginning 02/06/2020 CTs 04/25/2020- no evidence of metastatic disease, multiple bilateral renal calculi without hydronephrosis Maintenance Xeloda  continued CT abdomen/pelvis without contrast 08/10/2020-bilateral staghorn renal calculi, no evidence of metastatic disease; addendum 08/23/2020-small but increasing omental nodules. Cycle 1 FOLFIRI/panitumumab  09/19/2020 Cycle 2 FOLFIRI/Panitumumab  10/03/2020 Cycle 3 FOLFIRI/Panitumumab  10/17/2020 Cycle 4 FOLFIRI/panitumumab  10/31/2020 Cycle 5 FOLFIRI/Panitumumab  11/14/2020 CTs 11/26/2020-stable omental metastases compared to 10/22/2020, mildly decreased from 08/10/2020.  Left lower quadrant tiny paracolic gutter implant slightly decreased from CT 08/10/2020.  No new or progressive metastatic disease in the abdomen or pelvis. Cycle 6 FOLFIRI/Panitumumab  11/28/2020, irinotecan  dose reduced, treatment schedule adjusted to every 3 weeks going forward Cycle 7 FOLFIRI/panitumumab  12/24/2020 Cycle 8 FOLFIRI/Panitumumab  01/16/2021--treatment held due to hypertension in the infusion area. Cycle 8 FOLFIRI/panitumumab  01/21/2021 Cycle 9 FOLFIRI/panitumumab  02/11/2021 Cycle 10 FOLFIRI/Panitumumab  03/04/2021 Cycle 11 FOLFIRI/Panitumumab  03/25/2021 CT abdomen/pelvis 04/10/2021-stable right omental and left posterior paracolic gutter nodules Cycle 12 FOLFIRI/panitumumab  04/15/2021 Cycle 13 FOLFIRI/panitumumab  05/06/2021 Cycle 14 FOLFIRI/Panitumumab  05/27/2021 Cycle 15 FOLFIRI/Panitumumab  06/17/2021 CT abdomen/pelvis 07/05/2021-slight enlargement of a dominant right omental implant, no new implants Patient requested a treatment break CT abdomen/pelvis 10/04/2021-mild increase in  size of multiple omental soft tissue nodules, 4 mm calculus in the distal left ureter adjacent to the ureteral stent Cycle 1 Lonsurf  10/14/2021 10/28/2021 Avastin  every 2 weeks Cycle 2 Lonsurf  11/11/2021 Cycle 3 Lonsurf  12/09/2021 Cycle 4 Lonsurf  01/06/2022 Avastin  held 01/20/2022 due to proteinuria, 24-hour urine 43 mg CT/pelvis 01/30/2022-possible new peritoneal implant at the transverse colon, no significant change in other omental/peritoneal implants, stable chronic rectal wall thickening, status post removal of double-J left ureteral stent with no evidence of hydronephrosis or ureteral calculus, nonobstructing bilateral renal calculi Cycle 5 Lonsurf  02/05/2022 or 02/06/2022 Avastin  every 2 weeks Cycle 6 Lonsurf  03/03/2022 CTs 04/05/2022-increase in size and number of peritoneal implants compared to January 2024 central liver mass narrowing the anterior branch of the right portal vein, decreased biliary duct dilation, biliary stents in place, stable right cardiophrenic lymph nodes, separate pigtail stent in the lumen of the proximal duodenum Cycle 1 FOLFOX 04/14/2022 CT abdomen/pelvis 04/16/2022-stent within the third portion of the duodenum, stable peritoneal nodules, indwelling biliary stents with mild left biliary duct dilation Cycle 2 FOLFOX 04/28/2022, Emend and prophylactic dexamethasone  added Cycle 3 FOLFOX 05/12/2022, Aloxi , Emend, prophylactic dexamethasone , Compazine  and lorazepam  as needed Chemotherapy held 05/25/2020 due to severe hypertension, evaluated in the emergency department Cycle 4 FOLFOX 06/03/2022 Cycle 5 FOLFOX 06/16/2022, oxaliplatin  dose reduced and 5-FU bolus eliminated due to neutropenia CT 06/25/2022-unchanged soft tissue at the hepatic hilum, unchanged, bile duct stent, decreased size of peritoneal and omental nodules and diminished volume of likely fluid in the lower left pelvis Cycle 6 FOLFOX 06/30/2022 Cycle 7 FOLFOX 07/21/2022 CT 07/24/2022 (in the emergency department with  nausea, headache, abdominal pain)-no acute pathology.  No significant interval change in omental nodularity. Cycle 8 FOLFOX 08/04/2022, pruritus on the palms at the end of the oxaliplatin  infusion, Pepcid  and Benadryl  given, symptoms resolved, infusion not resumed Cycle 9 FOLFOX 08/18/2022-Pepcid  and Benadryl  added to premedications, Oxaliplatin  diluted in a larger volume and infusion time increased Cycle 10 FOLFOX 09/08/2022 Cycle 11 FOLFOX 09/22/2022 10/06/2022 patient declined treatment CTs 10/15/2022-similar soft tissue fullness in the hepatic hilum with biliary stents in place, decrease in omental/peritoneal metastases Maintenance capecitabine  10/26/2022 CT abdomen/pelvis 11/19/2022-mild increase in tumor studding at the left paracolic gutter and omentum,  circumferential wall thickening of the rectum, biliary stents in place with minimal increase in intrahepatic biliary dilation, nonobstructive renal calculi Colonoscopy 11/23/2022-anastomosis at 15 cm from the anal verge-patent, 2 cm polypoid tissue at the anastomosis biopsied, invasive adenocarcinoma moderately differentiated, preserved expression of major MMR proteins CTs 12/15/2022: Possible developing nodule in the lateral left liver, increased omental nodularity, persistent wall thickening in the upper rectum CT abdomen/pelvis 12/24/2022: Increased thickened fold in the transverse colon-infectious/inflammatory, 2.1 cm lateral left liver lesion, potential new 5 mm segment 5 lesion, stable biliary stents with intrahepatic biliary dilation, central biliary wall thickening near the hepatic hilum, multiple peritoneal/omental implants unchanged from 12/15/2022, but likely larger than 11/19/2022 Cycle 1 irinotecan /Panitumumab  01/06/2023 Cycle 2 irinotecan /Panitumumab  01/20/2023 CTs 01/24/2023-slight increase in omental nodularity. Cycle 3 irinotecan /Panitumumab  02/03/2023 Cycle 4 irinotecan /panitumumab  02/17/2023 CTs abdomen/pelvis 02/24/2023-subtly hypoattenuating  lesion within the peripheral aspect of hepatic segment 2 redemonstrated.  Additional scattered subcentimeter low-density lesions within the liver too small to characterize.  Prominent intrahepatic biliary dilatation with biliary stents in place.  Similar degree of nodularity along the omentum and left paracolic gutter.  No ascites. CTs 03/09/2023-indwelling endoscopically placed stents; level of intrahepatic biliary duct dilatation is slightly improved on the left and some on the right.  Gallbladder is dilated with wall edema which is increasing.  More nodular tissue extending up along the porta hepatis.  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  Rectum with nodular wall thickening with extension into the adjacent tissue, nodular areas seen extending perirectal and presacral spaces.  Peritoneal carcinomatosis again identified. Irinotecan /Panitumumab  04/02/2023 Irinotecan /panitumumab  04/16/2023 Irinotecan /Panitumumab  05/07/2023 Irinotecan /Panitumumab  06/19/2023 CT abdomen/pelvis 06/21/2023-stable heterogeneous enhancement throughout the liver with nodularity along the surface of the liver.  Small amount of perihepatic ascites.  Stable nodularity right abdomen.  Circumferential rectal wall thickening/mass unchanged. Irinotecan /Panitumumab  discontinued 07/10/2023 due to lack of clinical benefit/poor tolerance Appointment with Dr. Sheilda 07/23/2023-FOLFOX, guardant testing CTs 08/02/2023-mid rectal soft tissue mass, mildly progressive.  Right hepatic metastases, progressive.  Peritoneal disease/omental caking stable versus mildly progressive.  Small volume abdominopelvic ascites mildly progressive. Cycle 1 FOLFOX 08/05/2023 Cycle 2 FOLFOX 08/20/2023 (Pepcid  and Benadryl  daily x 2 days before treatment, Pepcid  continued for 2 days post oxaliplatin ; oxaliplatin  administration time increased) CTs 09/20/2023 grossly stable soft tissue mass within the mid rectum.  Stable cystic structure within the left lower pelvis,  either loculated fluid, cystic peritoneal metastases or left adnexal cyst.  Interval development of mild left hydronephrosis and left hydroureter without clear cause for obstruction identified.  Stable peritoneal metastases and omental caking most pronounced in the right upper quadrant.  Diffuse metastatic disease about the right lobe of the liver subjectively worse since the prior exam.  Small volume ascites. Cycle 3 FOLFOX planned 10/01/2023 CT abdomen/pelvis 10/03/2023: Stable rectal soft tissue, stable peritoneal metastasis/omental caking, stable cystic collection in the left pelvis     2. Hypertension G4 P3, twins Kidney stones-bilateral staghorn renal calculi on CT 08/10/2020 Infected Port-A-Cath 09/12/2019-placed on Augmentin , referred for Port-A-Cath removal; Port-A-Cath removed 09/14/2019; PICC line placed 09/14/2019; culture staph aureus.  Course of Septra  completed. Neutropenia secondary to chemotherapy-Udenyca  added with cycle 8 FOLFIRI Right nephrostomy tube 09/12/2020; stent placed 10/09/2020, percutaneous nephrostolithotomy treatment of right-sided kidney stones 10/09/2020, malpositioned right ureter stent replacement 10/23/2020, right ureter stent removed 10/29/2020 Percutaneous left nephrostolithotomy 10/01/2021-no renal stone identified, left ureter stent left in place Cystoscopy 10/14/2021-stent removed 8.  Admission 03/21/2022 with new onset jaundice, abdominal pain, nausea MRI abdomen 03/22/2022-central liver mass with obstruction at the confluence of the  intrahepatic ducts and proximal common hepatic duct with severe Intermatic biliary ductal dilatation, progressive peritoneal carcinomatosis ERCP 03/24/2022-severe biliary stricture in the hepatic duct affecting the left and right hepatic duct, common hepatic duct, and bifurcation, malignant appearing, temporary plastic pancreatic stent, left and right hepatic duct stents were placed ERCP 02/25/2018 25-2 visibly patent stents from the biliary tree  were seen in the major papilla, removed.  2 moderate biliary strictures found in the hepatic duct system, malignant appearing.  Left and right hepatic duct and all intrahepatic branches were moderately dilated secondary to the strictures.  The strictures were dilated.  Plastic stent placed into the left hepatic duct and right hepatic duct. 9.  Admission 04/16/2022 with nausea/vomiting and abdominal pain, felt to be acute toxicity related to chemotherapy, discharged home 04/18/2022 10.  Admission 11/19/2022 with nausea/vomiting and abdominal pain-improved 11.  Admission 12/15/2022 with fever and right submandibular swelling/tenderness CT neck 12/15/2022: 2 mm distal right submandibular duct calculus with mild inflammatory change adjacent to the right submandibular gland 12.  Admission 12/24/2022 with a fever nausea/vomiting, diarrhea, Klebsiella bacteremia completed course of Augmentin  13.  Admission with abdominal pain and nausea/vomiting 03/09/2023 through 03/14/2023-concern for gallbladder inflammation.  Not felt to be a surgical candidate.  Percutaneous cholecystostomy tube placed 03/11/2023.  Tube removed 04/24/2023. 14.  Admission 06/20/2023 with abdominal pain and constipation, pain improved partially following a bowel movement 15.  Admission 08/02/2023 with abdominal pain, cycle 1 salvage FOLFOX 08/05/2023 16.  Admission 09/20/2023 with nausea and abdominal pain 17.  Admission 10/03/2023 with nausea and abdominal pain 10/05/2023 NG tube placed secondary to recurrent nausea and vomiting,   [name] has metastatic colon cancer.  She has developed progressive metastatic disease despite multiple systemic treatment regimens.  She has completed 3 cycles of salvage therapy with FOLFOX.  Her clinical status has declined while on FOLFOX and the CEA is higher.  I reviewed the admission CT images.  She does not have significant ascites.  The abdominal distention appears to be related to omental tumor caking and  ileus  She has been admitted repeatedly with abdominal pain, nausea, and constipation.  Her symptoms are very likely related to carcinomatosis.  Her pain has not been adequately controlled with oral narcotics.  She is scheduled for a lumbar infusion pump next month.  She has persistent abdominal pain and developed nausea and vomiting in the hospital.  She remains constipated.  An NG tube was placed yesterday abdominal distention and pain have improved with placement of the NG tube.  She continues to have significant output from the NG tube.  It is possible the ileus will improve with a bowel obstruction, but I suspect she will have ongoing obstructive symptoms.  We discussed placement of a palliative venting gastrostomy.  It may be difficult to place a gastrostomy with the abdominal tumor burden.    Recommendations: 1.  Continue narcotic analgesics per palliative care medicine  2.  Bowel regimen, trial of clamping NG tube 3.  Hypertension per the medical service 4.  Consider placement of a venting gastrostomy tube if the NG tube cannot be removed  LOS: 3 days   Arley Hof, MD   10/07/2023, 1:57 PM

## 2023-10-08 DIAGNOSIS — C19 Malignant neoplasm of rectosigmoid junction: Secondary | ICD-10-CM | POA: Diagnosis not present

## 2023-10-08 DIAGNOSIS — R109 Unspecified abdominal pain: Secondary | ICD-10-CM | POA: Diagnosis not present

## 2023-10-08 DIAGNOSIS — E43 Unspecified severe protein-calorie malnutrition: Secondary | ICD-10-CM | POA: Diagnosis not present

## 2023-10-08 DIAGNOSIS — N19 Unspecified kidney failure: Secondary | ICD-10-CM | POA: Diagnosis not present

## 2023-10-08 DIAGNOSIS — Z515 Encounter for palliative care: Secondary | ICD-10-CM | POA: Diagnosis not present

## 2023-10-08 DIAGNOSIS — G893 Neoplasm related pain (acute) (chronic): Secondary | ICD-10-CM | POA: Diagnosis not present

## 2023-10-08 MED ORDER — METHYLNALTREXONE BROMIDE 12 MG/0.6ML ~~LOC~~ SOLN: 8.0000 mg | SUBCUTANEOUS | Status: DC

## 2023-10-08 MED ORDER — LACTULOSE 10 GM/15ML PO SOLN
10.0000 g | Freq: Every day | ORAL | Status: DC
  Administered 2023-10-08: 10 g via ORAL
  Filled 2023-10-08 (×3): qty 15

## 2023-10-08 MED ORDER — LACTULOSE 10 GM/15ML PO SOLN
30.0000 g | Freq: Every day | ORAL | Status: DC
  Administered 2023-10-08: 30 g via ORAL
  Administered 2023-10-09 – 2023-10-10 (×2): 10 g via ORAL
  Filled 2023-10-08 (×3): qty 45

## 2023-10-08 MED ORDER — LABETALOL HCL 5 MG/ML IV SOLN
10.0000 mg | INTRAVENOUS | Status: DC | PRN
  Administered 2023-10-08 – 2023-10-09 (×4): 10 mg via INTRAVENOUS
  Filled 2023-10-08 (×4): qty 4

## 2023-10-08 MED ORDER — HYDRALAZINE HCL 20 MG/ML IJ SOLN
10.0000 mg | Freq: Three times a day (TID) | INTRAMUSCULAR | Status: DC
  Administered 2023-10-08 – 2023-10-11 (×10): 10 mg via INTRAVENOUS
  Filled 2023-10-08 (×10): qty 1

## 2023-10-08 MED ORDER — HYDRALAZINE HCL 20 MG/ML IJ SOLN
20.0000 mg | Freq: Once | INTRAMUSCULAR | Status: AC
  Administered 2023-10-08: 20 mg via INTRAVENOUS
  Filled 2023-10-08: qty 1

## 2023-10-08 MED ORDER — SENNA 8.6 MG PO TABS
3.0000 | ORAL_TABLET | Freq: Every day | ORAL | Status: DC
  Administered 2023-10-08 – 2023-10-10 (×3): 25.8 mg via ORAL
  Filled 2023-10-08 (×3): qty 3

## 2023-10-08 MED ORDER — SODIUM CHLORIDE 0.9 % IV SOLN
8.0000 mg | Freq: Four times a day (QID) | INTRAVENOUS | Status: DC | PRN
  Administered 2023-10-12: 8 mg via INTRAVENOUS
  Filled 2023-10-08: qty 4

## 2023-10-08 NOTE — Progress Notes (Signed)
 [facility]                                                                              April [name], is a 48 y.o. 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 She has had multiple hospitalizations for similar especially throughout this year. Palliative medicine, oncology following for assistance with pain management and ongoing GOC discussions.   Assessment & Plan      Intractable abdominal pain Cancer associated pain - History of stage IV colon cancer with peritoneal carcinomatosis - Uncontrolled pain at home prompting admission as similar to prior - Currently on Dilaudid  PCA, decadron  - palliative medicine assisting with management and ongoing GOC discussions.   Nausea and vomiting, severe constipation - Continue octreotide  - No BM yet, has been refusing Relistor  , explained to the patient about Relistor  and she is willing to try.  - Continue NG tube - Per patient, she tolerated potato soup, diet advanced to full liquids -Palliative following, added Senokot, lactulose , will need venting PEG if no significant improvement -Ongoing goals of care discussions   Hypertension - suspect partially pain associated as well, continue pain management - On scheduled IV metoprolol , clonidine  patch, added scheduled hydralazine     Hypokalemia -Replete as needed   Abnormal LFTs, stage IV colon cancer - Due to liver metastasis, on octreotide       Estimated body mass index is 20.47  kg/m as calculated from the following:   Height as of this encounter: 5' 4 (1.626 m).   Weight as of this encounter: 54.1 kg.  Code Status: DNR DVT Prophylaxis:  SCDs Start: 10/03/23 2004   Level of Care: Level of care: Stepdown Family Communication:  Disposition Plan:      Remains inpatient appropriate:   Not stable for DC   Procedures:    Consultants:   Palliative medicine, oncology  Antimicrobials:   Anti-infectives (From admission, onward)    None          Medications  Chlorhexidine  Gluconate Cloth  6 each Topical Daily   cloNIDine   0.3 mg Transdermal Weekly   dexamethasone  (DECADRON ) injection  4 mg Intravenous q morning   fentaNYL   2 patch Transdermal Q72H   hydrALAZINE   10 mg Intravenous Q8H   HYDROmorphone    Intravenous Q4H   lactulose   10 g Oral QHS   lactulose   30 g Oral Daily   methylnaltrexone   8 mg Subcutaneous Q24H   metoprolol  tartrate  10 mg Intravenous Q6H   senna  3 tablet Oral Daily   sodium chloride  flush  10-40 mL Intracatheter Q12H      Subjective:  April [name] was seen and examined today.  States pain is better controlled, ate potato soup yesterday, tolerated it well.  Still no BM.  Had been refusing Relistor .   Objective:   Vitals:   10/08/23 1000 10/08/23 1100 10/08/23 1108 10/08/23 1112  BP: (!) 160/125 (!) 182/118 (!) 159/126   Pulse: (!) 101     Resp: 19 10 12 16   Temp:      TempSrc:      SpO2: 96%   94%  Weight:      Height:        Intake/Output Summary (Last 24 hours) at 10/08/2023 1250 Last data filed at 10/08/2023 1112 Gross per 24 hour  Intake 1905.76 ml  Output 6300 ml  Net -4394.24 ml     Wt Readings from Last 3 Encounters:  10/05/23 54.1 kg  09/30/23 51.8 kg  09/20/23 54.3 kg   Physical Exam General: Alert and oriented x 3, NAD NGT + Cardiovascular: S1 S2 clear, RRR.  Respiratory: CTAB, no wheezing Gastrointestinal: Soft, diffuse TTP Ext: no pedal edema bilaterally Neuro: no new  deficits Psych: Normal affect    Data Reviewed:  I have personally reviewed following labs    CBC Lab Results  Component Value Date   WBC 7.1 10/05/2023   RBC 3.39 (L) 10/05/2023   HGB 9.2 (L) 10/05/2023   HCT 29.9 (L) 10/05/2023   MCV 88.2 10/05/2023   MCH 27.1 10/05/2023   PLT 257 10/05/2023   MCHC 30.8 10/05/2023   RDW 17.0 (H) 10/05/2023   LYMPHSABS 1.6 10/05/2023   MONOABS 0.2 10/05/2023   EOSABS 0.1 10/05/2023   BASOSABS 0.0 10/05/2023     Last metabolic panel Lab Results  Component Value Date   NA 137 10/06/2023   K 3.7 10/06/2023   CL 98 10/06/2023   CO2 26 10/06/2023   BUN 10 10/06/2023   CREATININE 0.44 10/06/2023   GLUCOSE 101 (H) 10/06/2023   GFRNONAA >60 10/06/2023   GFRAA >60 10/17/2019   CALCIUM  8.6 (L) 10/06/2023   PHOS 3.8 10/06/2023   PROT 7.2 10/04/2023   ALBUMIN 3.1 (L) 10/06/2023   BILITOT 1.2 10/04/2023   ALKPHOS 617 (H) 10/04/2023   AST 149 (H) 10/04/2023   ALT 75 (H) 10/04/2023   ANIONGAP 13 10/06/2023    CBG (last 3)  No results for input(s): GLUCAP in the last 72 hours.    Coagulation Profile: No results for input(s): INR, PROTIME in the last 168 hours.   Radiology Studies: I have personally reviewed the imaging studies  No results found.      Nydia Distance M.D. [facility] 10/08/2023, 12:50 PM  Available via Epic secure chat 7am-7pm After 7 pm, please refer to night coverage provider listed on amion.

## 2023-10-08 NOTE — Progress Notes (Signed)
 Patient BP continue to be elevated. Patient refused additional PRN medication after the 1st dose given of labetalol  and hydralazine . She states  these meds are making me sleepy and I don't want to take anymore. Patient educated on blood pressure monitoring and treatment.

## 2023-10-08 NOTE — Progress Notes (Signed)
 Daily Progress Note   Patient Name: April April [name]       Date: 10/08/2023 DOB: 1975-02-19  Age: 48 y.o. MRN#: [id number] Attending Physician: April Nydia POUR, MD Primary Care Physician: April Camie BRAVO, NP Admit Date: 10/03/2023  Reason for Consultation/Follow-up: Establishing goals of care, Non pain symptom management, and Pain control  Patient Profile/HPI:    48 y.o. 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 Palliative medicine consulted for medical decision making assistance and symptom management.    Subjective: Chart reviewed including labs, progress notes, imaging from this and previous encounters.  [name] has used total of 100.5mg  IV hydromorphone  over last 24 hours. 200mcg/hr Fentanyl  through patch.  She feels her pain is adequately controlled.  She still has not had a bowel movement. She is refusing the Relistor .  We discussed manual disimpaction, soap suds enema, and resuming lactulose  and senna.  She understands if bowels don't move then we will need to proceed with a venting PEG and we have previously discussed this will affect her nutritional state and her prognosis will be shortened if her bowels are unable to move.  Her mom has been in contact with hospice and is coordinating delivery of home equipment.  Plan discussed with attending Dr. Davia, hospice liaison April April [name], and bedside nurse.   Review of Systems  Gastrointestinal:  Positive for abdominal pain and constipation.  Genitourinary:        Retention     Physical Exam Vitals and nursing  note reviewed.  HENT:     Nose:     Comments: NG tube in place Cardiovascular:     Rate and Rhythm: Normal rate.  Pulmonary:     Effort: Pulmonary effort is normal.  Abdominal:     General: There is distension.  Skin:    General: Skin is warm and dry.  Neurological:     Mental Status: She is alert and oriented to person, place, and time.             Vital Signs: BP (!) 159/126 (BP Location: Right Arm)   April [name] (!) 101   Temp (!) 97.5 F (  36.4 C) (Oral)   Resp 16   Ht 5' 4 (1.626 m)   Wt 54.1 kg   LMP 01/07/2006   SpO2 94%   BMI 20.47 kg/m  SpO2: SpO2: 94 % O2 Device: O2 Device: Room Air O2 Flow Rate: O2 Flow Rate (L/min): 0 L/min  Intake/output summary:  Intake/Output Summary (Last 24 hours) at 10/08/2023 1145 Last data filed at 10/08/2023 1112 Gross per 24 hour  Intake 1905.76 ml  Output 6300 ml  Net -4394.24 ml   LBM: Last BM Date :  (PTA) Baseline Weight: Weight: 53.4 kg Most recent weight: Weight: 54.1 kg       Palliative Assessment/Data: PPS: 20%      Patient Active Problem List   Diagnosis Date Noted   Dehydration 10/04/2023   Acute prerenal azotemia 10/04/2023   History of essential hypertension 10/04/2023   Anemia of chronic disease 10/04/2023   Intractable abdominal pain 10/03/2023   Palliative care encounter 09/22/2023   Uncontrolled pain 08/07/2023   Peritoneal carcinomatosis (HCC) 08/03/2023   Goals of care, counseling/discussion 08/03/2023   Constipation 08/03/2023   Intractable pain 08/03/2023   DNR (do not resuscitate) discussion 06/22/2023   ACP (advance care planning) 06/22/2023   Hypovolemia 06/21/2023   Rectal adenocarcinoma (HCC) 06/21/2023   Nausea & vomiting 06/21/2023   EKG, abnormal 06/21/2023   Counseling and coordination of care 06/21/2023   Malignant neoplasm metastatic to liver (HCC) 06/21/2023   Cancer associated pain 06/21/2023   Medication management 06/21/2023   Palliative care by specialist 06/21/2023    Accelerated hypertension 06/08/2023   Cancer of ascending colon metastatic to intra-abdominal lymph node (HCC) 06/07/2023   Hypertensive urgency 03/24/2023   Abdominal pain, chronic, right upper quadrant 03/09/2023   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   Upper abdominal pain 02/24/2023   Hyperproteinemia 01/24/2023   Elevated alkaline phosphatase level 01/24/2023   History of biliary stent insertion 12/31/2022   Intrahepatic bile duct dilation 12/31/2022   Imaging of gastrointestinal tract abnormal 12/31/2022   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   Enterocolitis 12/26/2022   Bacteremia due to Klebsiella pneumoniae 12/26/2022   Hyponatremia 12/24/2022   Emphysema/COPD 12/16/2022   Ascending aortic aneurysm 12/16/2022   Diverticulosis of colon without hemorrhage 11/23/2022   Abnormal CT of the abdomen 11/21/2022   Cancer related pain 11/19/2022   Acute nonintractable headache 08/11/2022   History of ERCP 06/28/2022   Former smoker 04/30/2022   Intractable nausea and vomiting 04/16/2022   Protein-calorie malnutrition, severe 03/26/2022   Chronic constipation 03/26/2022   Metastatic colon cancer to liver (HCC) 03/24/2022   Obstructive jaundice 03/24/2022   Biliary stricture 03/24/2022   Biliary obstruction 03/23/2022   Hyperbilirubinemia 03/21/2022   Abnormal LFTs 03/21/2022   Kidney stone 08/01/2021   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   Sepsis (HCC)    Colorectal cancer, stage IV (HCC) 10/15/2020   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   Port-A-Cath in place 11/22/2019   High risk medication use 06/07/2019   Adnexal mass 04/09/2019   Malignant neoplasm of rectosigmoid (colon) (HCC) 04/08/2019   Hypertension 01/07/2018   Sinus tachycardia 01/07/2018   Hypokalemia 01/07/2018   Prolonged QT interval 01/07/2018   Hyperglycemia 01/07/2018   Benign essential hypertension  10/06/2017    Palliative Care Assessment & Plan    Assessment/Recommendations/Plan  Stage IV colon cancer- plan for symptom management/comfort focused care Continue Fentanyl  patch 200 mcg Q72hr Continue PCA bolus  dose to 1.5mg  with 10 min lockout Continue basal rate hydromorphone  via PCA 2mg  Change Ondansetron  to 8mg  IV q8 hr prn (last QTc was 473) Continue dexamethasone  4mg  IV qAM Continue IV lorazepam  1mg  IV q4hr prn anxiety and nausea Continue NG tube for now- will allow for clears per her request Stop Relistor  Manual disimpaction Soap suds enema Lactulose  30gm daily and 10gm at bedtime Senna 3 po daily GOC- symptom management, but allowing her to be able to interact with family TOC order placed for hospice referral- plan to go to her mother's house with PCA pump in place Code status changed to DNR-comfort     Code Status:   Code Status: Do not attempt resuscitation (DNR) - Comfort care   Prognosis:  < 3 months  Discharge Planning: Home with Hospice  Care plan was discussed with patient and bedside RN.   Thank you for allowing the Palliative Medicine Team to assist in the care of this patient.  Total time:  Prolonged billing:  Time includes:   Preparing to see the patient (e.g., review of tests) Obtaining and/or reviewing separately obtained history Performing a medically necessary appropriate examination and/or evaluation Counseling and educating the patient/family/caregiver Ordering medications, tests, or procedures Referring and communicating with other health care professionals (when not reported separately) Documenting clinical information in the electronic or other health record Independently interpreting results (not reported separately) and communicating results to the patient/family/caregiver Care coordination (not reported separately) Clinical documentation  Cassondra Stain, AGNP-C Palliative Medicine   Please contact Palliative Medicine Team phone  at [phone] for questions and concerns.

## 2023-10-08 NOTE — Progress Notes (Signed)
 [facility] Liaison Note  Received request from [facility] for hospice services at home after discharge. Spoke with patient's mother to initiate education related to hospice philosophy, services and team approach to care. Mother verbalized understanding of information given. Per discussion, the plan is for discharge home once DME and home PCA pump are in place.   DME needs discussed. Patient has the following equipment in the home: none Family requests the following equipment for delivery: Oxygen 2 lpm, hospital bed, overbed table, BSC, walker, Dilaudid  PCA pump.  Please send signed and completed DNR home with patient/family. Please provide prescriptions at discharge as needed to ensure ongoing symptom management.  [facility] information and contact numbers given to patient's mother. Please call with any concerns.  Thank you for the opportunity to participate in this patient's care.   Eleanor Nail, LPN [facility] Liaison [phone]

## 2023-10-09 DIAGNOSIS — G893 Neoplasm related pain (acute) (chronic): Secondary | ICD-10-CM | POA: Diagnosis not present

## 2023-10-09 DIAGNOSIS — R109 Unspecified abdominal pain: Secondary | ICD-10-CM | POA: Diagnosis not present

## 2023-10-09 DIAGNOSIS — C787 Secondary malignant neoplasm of liver and intrahepatic bile duct: Secondary | ICD-10-CM | POA: Diagnosis not present

## 2023-10-09 DIAGNOSIS — Z7189 Other specified counseling: Secondary | ICD-10-CM

## 2023-10-09 DIAGNOSIS — Z515 Encounter for palliative care: Secondary | ICD-10-CM | POA: Diagnosis not present

## 2023-10-09 DIAGNOSIS — Z79899 Other long term (current) drug therapy: Secondary | ICD-10-CM

## 2023-10-09 DIAGNOSIS — R112 Nausea with vomiting, unspecified: Secondary | ICD-10-CM | POA: Diagnosis not present

## 2023-10-09 DIAGNOSIS — C189 Malignant neoplasm of colon, unspecified: Secondary | ICD-10-CM

## 2023-10-09 DIAGNOSIS — Z711 Person with feared health complaint in whom no diagnosis is made: Secondary | ICD-10-CM

## 2023-10-09 DIAGNOSIS — Z66 Do not resuscitate: Secondary | ICD-10-CM

## 2023-10-09 DIAGNOSIS — E43 Unspecified severe protein-calorie malnutrition: Secondary | ICD-10-CM | POA: Diagnosis not present

## 2023-10-09 LAB — CBC
HCT: 31.8 % — ABNORMAL LOW (ref 36.0–46.0)
Hemoglobin: 9.9 g/dL — ABNORMAL LOW (ref 12.0–15.0)
MCH: 28.2 pg (ref 26.0–34.0)
MCHC: 31.1 g/dL (ref 30.0–36.0)
MCV: 90.6 fL (ref 80.0–100.0)
Platelets: 349 K/uL (ref 150–400)
RBC: 3.51 MIL/uL — ABNORMAL LOW (ref 3.87–5.11)
RDW: 17.6 % — ABNORMAL HIGH (ref 11.5–15.5)
WBC: 10.3 K/uL (ref 4.0–10.5)
nRBC: 0.2 % (ref 0.0–0.2)

## 2023-10-09 LAB — RENAL FUNCTION PANEL
Albumin: 3.2 g/dL — ABNORMAL LOW (ref 3.5–5.0)
Anion gap: 11 (ref 5–15)
BUN: 9 mg/dL (ref 6–20)
CO2: 30 mmol/L (ref 22–32)
Calcium: 8.9 mg/dL (ref 8.9–10.3)
Chloride: 97 mmol/L — ABNORMAL LOW (ref 98–111)
Creatinine, Ser: 0.62 mg/dL (ref 0.44–1.00)
GFR, Estimated: >60 mL/min (ref >60)
Glucose, Bld: 85 mg/dL (ref 70–99)
Phosphorus: 2.7 mg/dL (ref 2.5–4.6)
Potassium: 3.5 mmol/L (ref 3.5–5.1)
Sodium: 138 mmol/L (ref 135–145)

## 2023-10-09 MED ORDER — LABETALOL HCL 5 MG/ML IV SOLN
20.0000 mg | Freq: Once | INTRAVENOUS | Status: AC
  Administered 2023-10-09: 20 mg via INTRAVENOUS
  Filled 2023-10-09: qty 4

## 2023-10-09 MED ORDER — OCTREOTIDE ACETATE 100 MCG/ML IJ SOLN
100.0000 ug | Freq: Three times a day (TID) | INTRAMUSCULAR | Status: DC
  Administered 2023-10-09 – 2023-10-10 (×5): 100 ug via SUBCUTANEOUS
  Filled 2023-10-09 (×7): qty 1

## 2023-10-09 MED ORDER — ALTEPLASE 2 MG IJ SOLR
2.0000 mg | Freq: Once | INTRAMUSCULAR | Status: AC
  Administered 2023-10-09: 2 mg
  Filled 2023-10-09: qty 2

## 2023-10-09 NOTE — Progress Notes (Signed)
 [facility] Liaison Note   [facility] continues to follow for discharge disposition for hospice services at home.   Please call with any hospice related questions or concerns.         Eleanor Nail, LPN [facility] Liaison [phone]

## 2023-10-09 NOTE — Plan of Care (Signed)
  Problem: Clinical Measurements: Goal: Respiratory complications will improve Outcome: Progressing Goal: Cardiovascular complication will be avoided Outcome: Progressing   Problem: Activity: Goal: Risk for activity intolerance will decrease Outcome: Progressing   Problem: Elimination: Goal: Will not experience complications related to urinary retention Outcome: Progressing   

## 2023-10-09 NOTE — Progress Notes (Signed)
 Daily Progress Note   Patient Name: April [name]       Date: 10/09/2023 DOB: 04-May-1975  Age: 48 y.o. MRN#: [id number] Attending Physician: Davia Nydia POUR, MD Primary Care Physician: Oris Camie BRAVO, NP Admit Date: 10/03/2023 Length of Stay: 5 days  Reason for Consultation/Follow-up: Establishing goals of care, Non pain symptom management, and Pain control  Subjective:   CC: Patient noting pain while controlled.  Still has NG tube in place for suction.  Following up regarding complex medical decision making and symptom management.  Subjective:  Extensive review of EMR including recent documentation from oncologist and hospitalist.  At time of EMR review in past 24 hours from PCA patient has received bolus dosing of 1.5 mg IV Dilaudid  x 27 doses.  Patient continues to receive IV Dilaudid  basal dose via PCA 2 mg/h.  Patient also receiving fentanyl  patch 200 mcg/h every 72 hours. Discussed care with RN for medical updates.  Patient continues to have substantial output from NG tube suction.  Presented to bedside to see patient.  Patient lying comfortably in bed.  No visitors present at bedside.  Able to follow-up regarding patient's symptom management at this time.  Patient feels PCA has greatly assisted with her pain management.  She does not currently feel doses need to be adjusted.  Discussed care planning moving forward.  Plan had been for patient to go home with [facility] hospice.  Patient has still only had minimal bowel movement and is noted to have worsening abdominal distention and pain when suction from NG tube stopped.  Had already been discussed with patient plan for venting PEG tube if needed for management.  Spent time discussing risk and benefits associated with pursuing venting PEG tube.  Discussed alternative of pursuing inpatient hospice for aggressive symptom management and potential suction with continuation of NG tube.  Patient noting that she is wanting to get home at  the end of her life.  Patient would be willing to pursue venting PEG tube with its associated risk to allow time at home with her family.  Noted would reach out to IR to determine if patient is even a candidate for venting PEG tube.  Discussed care with team including IR, hospitalist, oncologist, RN, [facility] liaison, Dr. Marvine from PMT, and TOC.  IR has stated patient is not a candidate for venting PEG tube due to her anatomy.  Discussed care with team trying to find other avenues to get patient home which is her ultimate goal for end-of-life care.  While at [facility] noting they are not able to support patient continuing NG tube with suction at home, noted would discuss with other hospice is in the area to determine if it can be supported by any hospice to allow patient her ultimate goal of being home at the end of life. After extensive discussions, [facility] has agreed that they would be able to support patient going home with NG tube remaining in place to continue suctioning and PCA management for end-of-life care.  Unfortunately equipment for suctioning machine could not be obtained until at earliest next Tuesday.  Presented to bedside to discuss care with patient regarding updates in the afternoon.  Explained that based on anatomy, IR stating patient is not a candidate for venting PEG tube.  Discussed that [facility] would be able to support patient going home with NG tube remaining in place and suction support.  Noted [facility] is unable to support this.  Discussed that the  earliest [facility] would be able to support patient getting home and equipment needed for suctioning would be Tuesday, possibly Wednesday.  Patient's goal remains permanently to go home for end-of-life care.  Patient noted she would be willing to stay in the hospital until she can go home with support from [facility] to continue NG tube suctioning.  Updated care team regarding discussions and  plan for discharge home at the earliest Tuesday of next week with [facility] support to allow for NG tube to stay in place with suctioning.  Objective:   Vital Signs:  BP (!) 157/121   Pulse (!) 106   Temp (!) 97.5 F (36.4 C) (Axillary)   Resp 12   Ht 5' 4 (1.626 m)   Wt 54.1 kg   LMP 01/07/2006   SpO2 95%   BMI 20.47 kg/m   Physical Exam: General: NAD, alert, chronically ill-appearing, frail Cardiovascular: RRR Respiratory: no increased work of breathing noted, not in respiratory distress Neuro: A&Ox4, following commands easily Psych: appropriately answers all questions  Assessment & Plan:   Assessment: Patient is a 48 y.o. 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 Palliative medicine consulted for medical decision making assistance and symptom management.   Recommendations/Plan: # Complex medical decision making/goals of care:  - Discussed care with patient as detailed above in HPI.  Patient was transition to full comfort focused care on 10/06/2023.  Have been working to manage symptoms so patient could go home with hospice support.  Unfortunately patient has extensive abdominal involvement of cancer causing issues with obstruction.  Patient currently has NG tube in place with suction for symptom management.  Discussed with IR and unable to place venting PEG tube due to anatomy, even for comfort. [facility] unable to support suctioning for NG tube to allow patient to go home. [facility] was contacted and noted that they would be able to support patient getting suctioning at home to continue NG tube placed for symptom management at end-of-life.  Earliest that equipment for this could be obtained is next Tuesday, potentially Wednesday.  Have  discussed with patient and she would want to stay in the hospital until she can go home with [facility] supporting continuing her NG tube with suctioning.  Palliative medicine team continuing to assist with discussions as able and appropriate moving forward.  -  Code Status: Do not attempt resuscitation (DNR) - Comfort care  # Symptom management: Patient is receiving these palliative interventions for symptom management with an intent to improve quality of life.   - Pain, severe acute on chronic pain in setting of metastatic colorectal cancer and now comfort focused care   - Continue dexamethasone  4 mg IV daily   - Continue fentanyl  patch 200 mcg/hr every 72 hours   - Continue IV Dilaudid  PCA with continuous basal infusion of 2 mg/h and bolus dosing of 1.5 mg every 10 minutes as needed.   - Constipation, severe   - Continue lactulose  30 mg daily in a.m. and 10 mg nightly   - Anxiety   - Continue IV Ativan  1 mg every 4 hours as needed   - Nausea/vomiting   - Patient currently has NG tube in place with suction.  IR stating patient not a  candidate for venting PEG tube even for comfort due to anatomy.   - Change octreotide  to 100 mcg subcu every 8 hours  # Psychosocial Support:  - Mother, daughter  # Discharge Planning: Home with [facility]osp[facility] through [facility], at the earliest on Tuesday, 10/13/2023.  Patient needs equipment for NG tube suctioning at home before can discharge.  Discussed with: Patient, hospitalist, oncologist, RN, TOC, [facility] liaison, [facility] liaison  Thank you for allowing the palliative care team to participate in the care [facility].  Tinnie Radar, DO Palliative Care Provider PMT # [phone]  If patient remains symptomatic despite maximum doses, please call PMT at [phone] between 0700 and 1900. Outside of these hours, please call attending, as PMT does not have night coverage.  Personally spent 80 minutes in patient care  including extensive chart review (labs, imaging, progress/consult notes, vital signs), medically appropraite exam, discussed with treatment team, education to patient, family, and staff, documenting clinical information, medication review and management, coordination of care, and available advanced directive documents.

## 2023-10-09 NOTE — Progress Notes (Signed)
 [facility]                                                                              April [name], is a 48 y.o. 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 She has had multiple hospitalizations for similar especially throughout this year. Palliative medicine, oncology following for assistance with pain management and ongoing GOC discussions.   Assessment & Plan      Intractable abdominal pain Cancer associated pain - History of stage IV colon cancer with peritoneal carcinomatosis - Uncontrolled pain at home prompting admission as similar to prior - Currently on Dilaudid  PCA, decadron  - palliative medicine assisting with management and ongoing GOC discussions.   Nausea and vomiting, severe constipation - Continue octreotide  - Small BM today, has been refusing Relistor , states Senokot and lactulose  works.   - Still has NGT, tolerating full liquid diet - Ongoing goals of care discussion, appreciate oncology and palliative medicine following, possible placement of a venting gastrostomy tube vs continuing NGT with suction at home with hospice    Hypertensive urgency - suspect partially pain associated as well, continue pain management - On scheduled IV metoprolol  every 6 hours, clonidine  patch 0.3 mg transdermal weekly, scheduled hydralazine  every 8 hours, IV labetalol  as needed    Hypokalemia -Replete as needed   Abnormal LFTs, stage IV colon cancer - Due to liver  metastasis, on octreotide       Estimated body mass index is 20.47 kg/m as calculated from the following:   Height as of this encounter: 5' 4 (1.626 m).   Weight as of this encounter: 54.1 kg.  Code Status: DNR DVT Prophylaxis:  SCDs Start: 10/03/23 2004   Level of Care: Level of care: Stepdown Family Communication:  Disposition Plan:      Remains inpatient appropriate:   Not stable for DC   Procedures:  Consultants:   Palliative medicine, oncology  Antimicrobials:   Anti-infectives (From admission, onward)    None          Medications  Chlorhexidine  Gluconate Cloth  6 each Topical Daily   cloNIDine   0.3 mg Transdermal Weekly   dexamethasone  (DECADRON ) injection  4 mg Intravenous q morning   fentaNYL   2 patch Transdermal Q72H   hydrALAZINE   10 mg Intravenous Q8H   HYDROmorphone    Intravenous Q4H   lactulose   10 g Oral QHS   lactulose   30 g Oral Daily   metoprolol  tartrate  10 mg Intravenous Q6H   octreotide   100 mcg Subcutaneous Q8H   senna  3 tablet Oral Daily   sodium chloride  flush  10-40 mL Intracatheter  Q12H      Subjective:   April [name] was seen and examined today.  States had a small BM this morning.  Refusing Relistor .  Still on NGT with suction.  Tolerating liquid diet.    Objective:   Vitals:   10/09/23 1200 10/09/23 1208 10/09/23 1222 10/09/23 1300  BP: (S) (!) 185/128   (!) 154/121  Pulse:   87 91  Resp: 16 15 (!) 6 (!) 8  Temp: 98 F (36.7 C)     TempSrc: Oral     SpO2: 95%  97% 93%  Weight:      Height:        Intake/Output Summary (Last 24 hours) at 10/09/2023 1352 Last data filed at 10/09/2023 1300 Gross per 24 hour  Intake 1344.45 ml  Output 5100 ml  Net -3755.55 ml     Wt Readings from Last 3 Encounters:  10/05/23 54.1 kg  09/30/23 51.8 kg  09/20/23 54.3 kg    Physical Exam General: Alert and oriented x 3, NGT Cardiovascular: S1 S2 clear, RRR.  Respiratory: CTAB Gastrointestinal: Soft, distended,  NBS Ext: no pedal edema bilaterally Neuro: no new deficits    Data Reviewed:  I have personally reviewed following labs    CBC Lab Results  Component Value Date   WBC 10.3 10/09/2023   RBC 3.51 (L) 10/09/2023   HGB 9.9 (L) 10/09/2023   HCT 31.8 (L) 10/09/2023   MCV 90.6 10/09/2023   MCH 28.2 10/09/2023   PLT 349 10/09/2023   MCHC 31.1 10/09/2023   RDW 17.6 (H) 10/09/2023   LYMPHSABS 1.6 10/05/2023   MONOABS 0.2 10/05/2023   EOSABS 0.1 10/05/2023   BASOSABS 0.0 10/05/2023     Last metabolic panel Lab Results  Component Value Date   NA 138 10/09/2023   K 3.5 10/09/2023   CL 97 (L) 10/09/2023   CO2 30 10/09/2023   BUN 9 10/09/2023   CREATININE 0.62 10/09/2023   GLUCOSE 85 10/09/2023   GFRNONAA >60 10/09/2023   GFRAA >60 10/17/2019   CALCIUM  8.9 10/09/2023   PHOS 2.7 10/09/2023   PROT 7.2 10/04/2023   ALBUMIN 3.2 (L) 10/09/2023   BILITOT 1.2 10/04/2023   ALKPHOS 617 (H) 10/04/2023   AST 149 (H) 10/04/2023   ALT 75 (H) 10/04/2023   ANIONGAP 11 10/09/2023    CBG (last 3)  No results for input(s): GLUCAP in the last 72 hours.    Coagulation Profile: No results for input(s): INR, PROTIME in the last 168 hours.   Radiology Studies: I have personally reviewed the imaging studies  No results found.      Nydia Distance M.D. [facility] 10/09/2023, 1:52 PM  Available via Epic secure chat 7am-7pm After 7 pm, please refer to night coverage provider listed on amion.

## 2023-10-09 NOTE — Progress Notes (Signed)
 IP PROGRESS NOTE  Subjective:   April [name] reports a small bowel movement after an enema.  She continues to have a large amount of output from the NG tube, though she is taking liquids by mouth. Objective: Vital signs in last 24 hours: Blood pressure (S) (!) 185/128, pulse 91, temperature 98 F (36.7 C), temperature source Oral, resp. rate (!) 8, height 5' 4 (1.626 m), weight 119 lb 4.3 oz (54.1 kg), last menstrual period 01/07/2006, SpO2 93%.  Intake/Output from previous day: 09/25 0701 - 09/26 0700 In: 2246.4 [P.O.:1680; I.V.:451.3; NG/GT:60; IV Piggyback:55.1] Out: 5600 [Urine:700; Emesis/NG output:4900]  Physical Exam: HEENT: NG tube in place, no thrush Abdomen: More distended today Extremities: No leg edema   Portacath/PICC-without erythema  Lab Results: Recent Labs    10/09/23 0531  WBC 10.3  HGB 9.9*  HCT 31.8*  PLT 349    BMET Recent Labs    10/09/23 0531  NA 138  K 3.5  CL 97*  CO2 30  GLUCOSE 85  BUN 9  CREATININE 0.62  CALCIUM  8.9    Lab Results  Component Value Date   CEA1 190.0 (H) 09/22/2023   CEA 289.71 (H) 09/30/2023   CAN199 213 (H) 03/23/2022    Studies/Results: No results found.   Medications: I have reviewed the patient's current medications.  Assessment/Plan: Sigmoid colon cancer, stage IV (pT4a,pN2b,M1c) Colonoscopy 03/24/2019-3 rectal polyps-hyperplastic polyps, distal colon biopsy-at least intramucosal adenocarcinoma, completely obstructing mass in the distal sigmoid colon, could not be traversed, intact mismatch repair protein expression 03/24/2019-CEA 56.1 03/29/2019 CT abdomen/pelvis-circumferential thickening involving the entire mid and distal sigmoid colon to the level of the rectosigmoid junction, solid/cystic mass in the left ovary, bilateral nephrolithiasis Robotic assisted low anterior resection, mesenteric lymphadenectomy, bilateral salpingo-oophorectomy 04/08/2019 Pathology ([facility] review of outside pathology)  metastatic adenocarcinoma involving the peritoneum overlying the round ligament, serosa of the urinary bladder, serosa of the right ureter a sacral area, pelvic peritoneum, and left ovary.  Omental biopsy with focal mucin pools with no carcinoma cells identified, right hemidiaphragm biopsy involved by metastatic adenocarcinoma, invasive adenocarcinoma the sigmoid colon, moderately differentiated, T4a, perineural and vascular invasion present, 7/22 lymph nodes, multiple tumor deposits, resection margins negative Negative for PD-L1, low probability of MSI-high, HER-2 negative, negative for BRAF, NRAS and KRAS alterations CTs 05/19/2019-no evidence of metastatic disease, findings suspicious for colitis of the transverse and ascending colon, small amount of ascites in the cul-de-sac, bilateral renal calculi Cycle 1 FOLFIRI 05/24/2019, bevacizumab  added with cycle 2 Cycle 4 FOLFIRI/bevacizumab  07/05/2019 Cycle 5 FOLFIRI 07/27/2019, bevacizumab  held secondary to hypertension Cycle 6 FOLFIRI 08/10/1999, bevacizumab  held secondary to hypertension CTs 08/30/2019-no evidence of recurrent disease, new subsolid right lower lobe nodule felt to be inflammatory, emphysema Cycle 7 FOLFIRI 09/26/2019, bevacizumab  remains on hold secondary to hypertension Cycle 8 FOLFIRI 10/17/2019, bevacizumab  held secondary to hypertension, Udenyca  added for neutropenia Cycle 9 FOLFIRI 11/07/2019, bevacizumab  held secondary to hypertension, Udenyca  Cycle 10 FOLFIRI 11/28/2019, bevacizumab  held, Udenyca  Cycle 11 FOLFIRI 12/26/2019, bevacizumab  held, Udenyca  CTs 01/25/2020-no evidence of recurrent disease, resolution of right lower lobe nodule Maintenance Xeloda  beginning 02/06/2020 CTs 04/25/2020- no evidence of metastatic disease, multiple bilateral renal calculi without hydronephrosis Maintenance Xeloda  continued CT abdomen/pelvis without contrast 08/10/2020-bilateral staghorn renal calculi, no evidence of metastatic disease; addendum  08/23/2020-small but increasing omental nodules. Cycle 1 FOLFIRI/panitumumab  09/19/2020 Cycle 2 FOLFIRI/Panitumumab  10/03/2020 Cycle 3 FOLFIRI/Panitumumab  10/17/2020 Cycle 4 FOLFIRI/panitumumab  10/31/2020 Cycle 5 FOLFIRI/Panitumumab  11/14/2020 CTs 11/26/2020-stable omental metastases compared to 10/22/2020, mildly decreased from 08/10/2020.  Left  lower quadrant tiny paracolic gutter implant slightly decreased from CT 08/10/2020.  No new or progressive metastatic disease in the abdomen or pelvis. Cycle 6 FOLFIRI/Panitumumab  11/28/2020, irinotecan  dose reduced, treatment schedule adjusted to every 3 weeks going forward Cycle 7 FOLFIRI/panitumumab  12/24/2020 Cycle 8 FOLFIRI/Panitumumab  01/16/2021--treatment held due to hypertension in the infusion area. Cycle 8 FOLFIRI/panitumumab  01/21/2021 Cycle 9 FOLFIRI/panitumumab  02/11/2021 Cycle 10 FOLFIRI/Panitumumab  03/04/2021 Cycle 11 FOLFIRI/Panitumumab  03/25/2021 CT abdomen/pelvis 04/10/2021-stable right omental and left posterior paracolic gutter nodules Cycle 12 FOLFIRI/panitumumab  04/15/2021 Cycle 13 FOLFIRI/panitumumab  05/06/2021 Cycle 14 FOLFIRI/Panitumumab  05/27/2021 Cycle 15 FOLFIRI/Panitumumab  06/17/2021 CT abdomen/pelvis 07/05/2021-slight enlargement of a dominant right omental implant, no new implants Patient requested a treatment break CT abdomen/pelvis 10/04/2021-mild increase in size of multiple omental soft tissue nodules, 4 mm calculus in the distal left ureter adjacent to the ureteral stent Cycle 1 Lonsurf  10/14/2021 10/28/2021 Avastin  every 2 weeks Cycle 2 Lonsurf  11/11/2021 Cycle 3 Lonsurf  12/09/2021 Cycle 4 Lonsurf  01/06/2022 Avastin  held 01/20/2022 due to proteinuria, 24-hour urine 43 mg CT/pelvis 01/30/2022-possible new peritoneal implant at the transverse colon, no significant change in other omental/peritoneal implants, stable chronic rectal wall thickening, status post removal of double-J left ureteral stent with no evidence of hydronephrosis or  ureteral calculus, nonobstructing bilateral renal calculi Cycle 5 Lonsurf  02/05/2022 or 02/06/2022 Avastin  every 2 weeks Cycle 6 Lonsurf  03/03/2022 CTs 04/05/2022-increase in size and number of peritoneal implants compared to January 2024 central liver mass narrowing the anterior branch of the right portal vein, decreased biliary duct dilation, biliary stents in place, stable right cardiophrenic lymph nodes, separate pigtail stent in the lumen of the proximal duodenum Cycle 1 FOLFOX 04/14/2022 CT abdomen/pelvis 04/16/2022-stent within the third portion of the duodenum, stable peritoneal nodules, indwelling biliary stents with mild left biliary duct dilation Cycle 2 FOLFOX 04/28/2022, Emend and prophylactic dexamethasone  added Cycle 3 FOLFOX 05/12/2022, Aloxi , Emend, prophylactic dexamethasone , Compazine  and lorazepam  as needed Chemotherapy held 05/25/2020 due to severe hypertension, evaluated in the emergency department Cycle 4 FOLFOX 06/03/2022 Cycle 5 FOLFOX 06/16/2022, oxaliplatin  dose reduced and 5-FU bolus eliminated due to neutropenia CT 06/25/2022-unchanged soft tissue at the hepatic hilum, unchanged, bile duct stent, decreased size of peritoneal and omental nodules and diminished volume of likely fluid in the lower left pelvis Cycle 6 FOLFOX 06/30/2022 Cycle 7 FOLFOX 07/21/2022 CT 07/24/2022 (in the emergency department with nausea, headache, abdominal pain)-no acute pathology.  No significant interval change in omental nodularity. Cycle 8 FOLFOX 08/04/2022, pruritus on the palms at the end of the oxaliplatin  infusion, Pepcid  and Benadryl  given, symptoms resolved, infusion not resumed Cycle 9 FOLFOX 08/18/2022-Pepcid  and Benadryl  added to premedications, Oxaliplatin  diluted in a larger volume and infusion time increased Cycle 10 FOLFOX 09/08/2022 Cycle 11 FOLFOX 09/22/2022 10/06/2022 patient declined treatment CTs 10/15/2022-similar soft tissue fullness in the hepatic hilum with biliary stents in place, decrease  in omental/peritoneal metastases Maintenance capecitabine  10/26/2022 CT abdomen/pelvis 11/19/2022-mild increase in tumor studding at the left paracolic gutter and omentum, circumferential wall thickening of the rectum, biliary stents in place with minimal increase in intrahepatic biliary dilation, nonobstructive renal calculi Colonoscopy 11/23/2022-anastomosis at 15 cm from the anal verge-patent, 2 cm polypoid tissue at the anastomosis biopsied, invasive adenocarcinoma moderately differentiated, preserved expression of major MMR proteins CTs 12/15/2022: Possible developing nodule in the lateral left liver, increased omental nodularity, persistent wall thickening in the upper rectum CT abdomen/pelvis 12/24/2022: Increased thickened fold in the transverse colon-infectious/inflammatory, 2.1 cm lateral left liver lesion, potential new 5 mm segment 5 lesion, stable biliary stents with intrahepatic biliary  dilation, central biliary wall thickening near the hepatic hilum, multiple peritoneal/omental implants unchanged from 12/15/2022, but likely larger than 11/19/2022 Cycle 1 irinotecan /Panitumumab  01/06/2023 Cycle 2 irinotecan /Panitumumab  01/20/2023 CTs 01/24/2023-slight increase in omental nodularity. Cycle 3 irinotecan /Panitumumab  02/03/2023 Cycle 4 irinotecan /panitumumab  02/17/2023 CTs abdomen/pelvis 02/24/2023-subtly hypoattenuating lesion within the peripheral aspect of hepatic segment 2 redemonstrated.  Additional scattered subcentimeter low-density lesions within the liver too small to characterize.  Prominent intrahepatic biliary dilatation with biliary stents in place.  Similar degree of nodularity along the omentum and left paracolic gutter.  No ascites. CTs 03/09/2023-indwelling endoscopically placed stents; level of intrahepatic biliary duct dilatation is slightly improved on the left and some on the right.  Gallbladder is dilated with wall edema which is increasing.  More nodular tissue extending up along the  porta hepatis.  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  Rectum with nodular wall thickening with extension into the adjacent tissue, nodular areas seen extending perirectal and presacral spaces.  Peritoneal carcinomatosis again identified. Irinotecan /Panitumumab  04/02/2023 Irinotecan /panitumumab  04/16/2023 Irinotecan /Panitumumab  05/07/2023 Irinotecan /Panitumumab  06/19/2023 CT abdomen/pelvis 06/21/2023-stable heterogeneous enhancement throughout the liver with nodularity along the surface of the liver.  Small amount of perihepatic ascites.  Stable nodularity right abdomen.  Circumferential rectal wall thickening/mass unchanged. Irinotecan /Panitumumab  discontinued 07/10/2023 due to lack of clinical benefit/poor tolerance Appointment with Dr. Sheilda 07/23/2023-FOLFOX, guardant testing CTs 08/02/2023-mid rectal soft tissue mass, mildly progressive.  Right hepatic metastases, progressive.  Peritoneal disease/omental caking stable versus mildly progressive.  Small volume abdominopelvic ascites mildly progressive. Cycle 1 FOLFOX 08/05/2023 Cycle 2 FOLFOX 08/20/2023 (Pepcid  and Benadryl  daily x 2 days before treatment, Pepcid  continued for 2 days post oxaliplatin ; oxaliplatin  administration time increased) CTs 09/20/2023 grossly stable soft tissue mass within the mid rectum.  Stable cystic structure within the left lower pelvis, either loculated fluid, cystic peritoneal metastases or left adnexal cyst.  Interval development of mild left hydronephrosis and left hydroureter without clear cause for obstruction identified.  Stable peritoneal metastases and omental caking most pronounced in the right upper quadrant.  Diffuse metastatic disease about the right lobe of the liver subjectively worse since the prior exam.  Small volume ascites. Cycle 3 FOLFOX planned 10/01/2023 CT abdomen/pelvis 10/03/2023: Stable rectal soft tissue, stable peritoneal metastasis/omental caking, stable cystic collection in the left pelvis      2. Hypertension G4 P3, twins Kidney stones-bilateral staghorn renal calculi on CT 08/10/2020 Infected Port-A-Cath 09/12/2019-placed on Augmentin , referred for Port-A-Cath removal; Port-A-Cath removed 09/14/2019; PICC line placed 09/14/2019; culture staph aureus.  Course of Septra  completed. Neutropenia secondary to chemotherapy-Udenyca  added with cycle 8 FOLFIRI Right nephrostomy tube 09/12/2020; stent placed 10/09/2020, percutaneous nephrostolithotomy treatment of right-sided kidney stones 10/09/2020, malpositioned right ureter stent replacement 10/23/2020, right ureter stent removed 10/29/2020 Percutaneous left nephrostolithotomy 10/01/2021-no renal stone identified, left ureter stent left in place Cystoscopy 10/14/2021-stent removed 8.  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  2 moderate biliary strictures found in the hepatic duct system, malignant appearing.  Left and right hepatic duct and all intrahepatic branches were moderately dilated secondary to the  strictures.  The strictures were dilated.  Plastic stent placed into the left hepatic duct and right hepatic duct. 9.  Admission 04/16/2022 with nausea/vomiting and abdominal pain, felt to be acute toxicity related to chemotherapy, discharged home 04/18/2022 10.  Admission 11/19/2022 with nausea/vomiting and abdominal pain-improved 11.  Admission 12/15/2022 with fever and  right submandibular swelling/tenderness CT neck 12/15/2022: 2 mm distal right submandibular duct calculus with mild inflammatory change adjacent to the right submandibular gland 12.  Admission 12/24/2022 with a fever nausea/vomiting, diarrhea, Klebsiella bacteremia completed course of Augmentin  13.  Admission with abdominal pain and nausea/vomiting 03/09/2023 through 03/14/2023-concern for gallbladder inflammation.  Not felt to be a surgical candidate.  Percutaneous cholecystostomy tube placed 03/11/2023.  Tube removed 04/24/2023. 14.  Admission 06/20/2023 with abdominal pain and constipation, pain improved partially following a bowel movement 15.  Admission 08/02/2023 with abdominal pain, cycle 1 salvage FOLFOX 08/05/2023 16.  Admission 09/20/2023 with nausea and abdominal pain 17.  Admission 10/03/2023 with nausea and abdominal pain 10/05/2023 NG tube placed secondary to recurrent nausea and vomiting,   April [name] has metastatic colon cancer.  She has developed progressive metastatic disease despite multiple systemic treatment regimens.  She has completed 3 cycles of salvage therapy with FOLFOX.  Her clinical status has declined while on FOLFOX and the CEA is higher.  I reviewed the admission CT images.  She does not have significant ascites.  The abdominal distention appears to be related to omental tumor caking and ileus.  She has been admitted repeatedly with abdominal pain, nausea, and constipation.  Her symptoms are very likely related to carcinomatosis.    She has persistent abdominal pain and developed nausea and vomiting in the hospital.  She remains constipated.  An NG tube was placed 10/05/2023 . abdominal distention and pain initially improved with placement of the NG tube.  She continues to have significant output from the NG tube.  It is possible the ileus will improve but I suspect she will have ongoing obstructive symptoms.  We discussed placement of a palliative venting gastrostomy.  It may be  difficult to place a gastrostomy with the abdominal tumor burden.  It is unclear whether she is benefiting from the Decadron  and octreotide .  April [name] continues to have hope she may be a candidate for a clinical trial in the future.  I think it is unlikely she will be able to dissipate in a clinical trial given her performance status and need to travel for a clinical trial.  Recommendations: 1.  Continue narcotic analgesics per palliative care medicine simplify pain regimen as possible for home hospice care 2.  Bowel regimen per the palliative care team 3.  Hypertension per the medical service 4.  Consider placement of a venting gastrostomy tube if the NG tube cannot be removed 5.  Please call Oncology as needed, I will check on her 10/12/2023 if she remains in the hospital  LOS: 5 days   Arley Hof, MD   10/09/2023, 1:07 PM

## 2023-10-10 DIAGNOSIS — G893 Neoplasm related pain (acute) (chronic): Principal | ICD-10-CM

## 2023-10-10 DIAGNOSIS — R109 Unspecified abdominal pain: Secondary | ICD-10-CM | POA: Diagnosis not present

## 2023-10-10 DIAGNOSIS — R112 Nausea with vomiting, unspecified: Secondary | ICD-10-CM | POA: Diagnosis not present

## 2023-10-10 DIAGNOSIS — C189 Malignant neoplasm of colon, unspecified: Secondary | ICD-10-CM | POA: Diagnosis not present

## 2023-10-10 DIAGNOSIS — E43 Unspecified severe protein-calorie malnutrition: Secondary | ICD-10-CM | POA: Diagnosis not present

## 2023-10-10 DIAGNOSIS — Z515 Encounter for palliative care: Secondary | ICD-10-CM | POA: Diagnosis not present

## 2023-10-10 MED ORDER — LORAZEPAM 2 MG/ML PO CONC: 1.0000 mg | ORAL | Status: DC | PRN

## 2023-10-10 MED ORDER — HYDROMORPHONE 1 MG/ML IV SOLN
INTRAVENOUS | Status: DC
  Administered 2023-10-10: 14 mg via INTRAVENOUS
  Administered 2023-10-10 – 2023-10-11 (×2): 30 mg via INTRAVENOUS
  Administered 2023-10-11: 10 mg via INTRAVENOUS
  Administered 2023-10-11: 2 mg via INTRAVENOUS
  Filled 2023-10-10 (×2): qty 30

## 2023-10-10 NOTE — Progress Notes (Signed)
 [facility]                                                                              [name], is a 48 y.o. 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 She has had multiple hospitalizations for similar especially throughout this year. Palliative medicine, oncology following for assistance with pain management and ongoing GOC discussions.   Assessment & Plan      Intractable abdominal pain Cancer associated pain - History of stage IV colon cancer with peritoneal carcinomatosis - Uncontrolled pain at home prompting admission as similar to prior - Currently on Dilaudid  PCA, decadron , fentanyl  patch - palliative medicine assisting with management and ongoing GOC discussions.   Nausea and vomiting, severe constipation - Continue octreotide  - Still has NGT, tolerating full liquid diet - Ongoing goals of care discussion, appreciate oncology and palliative medicine following.  Per IR, not a candidate for venting PEG tube due to anatomy.  -Will continue NG tube with suction, bowel regimen.    Hypertensive urgency - suspect partially pain associated as well, continue pain management - On scheduled IV metoprolol  every 6 hours, clonidine  patch 0.3 mg transdermal weekly, scheduled hydralazine  every 8 hours, IV labetalol  as needed - BP improving   Hypokalemia -Replete as needed   Abnormal LFTs, stage IV colon cancer - Due to liver metastasis, on octreotide       Estimated  body mass index is 20.47 kg/m as calculated from the following:   Height as of this encounter: 5' 4 10/15/23 m).   Weight as of this encounter: 54.1 kg.  Code Status: DNR DVT Prophylaxis:  SCDs Start: 10/03/23 2004   Level of Care: Level of care: Stepdown Family Communication:  Disposition Plan:      Remains inpatient appropriate: Patient will discharge to home with hospice on PCA pump, NG tube with intermittent wall suction.  Likely early next week.   Procedures:  Consultants:   Palliative medicine, oncology  Antimicrobials:   Anti-infectives (From admission, onward)    None          Medications  Chlorhexidine  Gluconate Cloth  6 each Topical Daily   cloNIDine   0.3 mg Transdermal Weekly   dexamethasone  (DECADRON ) injection  4 mg Intravenous q morning   fentaNYL   2 patch Transdermal Q72H   hydrALAZINE   10 mg Intravenous Q8H   HYDROmorphone    Intravenous Q4H   lactulose   10 g Oral QHS   lactulose   30 g Oral Daily   metoprolol  tartrate  10 mg Intravenous Q6H   octreotide   100 mcg Subcutaneous Q8H   senna  3 tablet Oral  Daily   sodium chloride  flush  10-40 mL Intracatheter Q12H      Subjective:   April [name] was seen and examined today.  Per patient, had a BM, acute on chronic pain.  Still has NG tube to suction.  Tolerating liquid diet.   Objective:   Vitals:   10/10/23 0801 10/10/23 1100 10/10/23 1200 10/10/23 1232  BP:  (!) 143/112 (!) 145/100   Pulse:   (!) 108   Resp: (!) 8 10 11 11   Temp:   97.9 F (36.6 C)   TempSrc:   Oral   SpO2:   97% 96%  Weight:      Height:        Intake/Output Summary (Last 24 hours) at 10/10/2023 1311 Last data filed at 10/10/2023 1248 Gross per 24 hour  Intake 960.4 ml  Output 5450 ml  Net -4489.6 ml     Wt Readings from Last 3 Encounters:  10/05/23 54.1 kg  09/30/23 51.8 kg  09/20/23 54.3 kg   Physical Exam General: Alert and oriented, NAD, ill-appearing, frail, NGT+ Cardiovascular: S1 S2 clear, RRR.   Respiratory: CTAB, no wheezing, rales or rhonchi Gastrointestinal: Soft, distended Ext: no pedal edema bilaterally Neuro: no new deficits Psych: Normal affect     Data Reviewed:  I have personally reviewed following labs    CBC Lab Results  Component Value Date   WBC 10.3 10/09/2023   RBC 3.51 (L) 10/09/2023   HGB 9.9 (L) 10/09/2023   HCT 31.8 (L) 10/09/2023   MCV 90.6 10/09/2023   MCH 28.2 10/09/2023   PLT 349 10/09/2023   MCHC 31.1 10/09/2023   RDW 17.6 (H) 10/09/2023   LYMPHSABS 1.6 10/05/2023   MONOABS 0.2 10/05/2023   EOSABS 0.1 10/05/2023   BASOSABS 0.0 10/05/2023     Last metabolic panel Lab Results  Component Value Date   NA 138 10/09/2023   K 3.5 10/09/2023   CL 97 (L) 10/09/2023   CO2 30 10/09/2023   BUN 9 10/09/2023   CREATININE 0.62 10/09/2023   GLUCOSE 85 10/09/2023   GFRNONAA >60 10/09/2023   GFRAA >60 10/17/2019   CALCIUM  8.9 10/09/2023   PHOS 2.7 10/09/2023   PROT 7.2 10/04/2023   ALBUMIN 3.2 (L) 10/09/2023   BILITOT 1.2 10/04/2023   ALKPHOS 617 (H) 10/04/2023   AST 149 (H) 10/04/2023   ALT 75 (H) 10/04/2023   ANIONGAP 11 10/09/2023    CBG (last 3)  No results for input(s): GLUCAP in the last 72 hours.    Coagulation Profile: No results for input(s): INR, PROTIME in the last 168 hours.   Radiology Studies: I have personally reviewed the imaging studies  No results found.      Nydia Distance M.D. [facility] 10/10/2023, 1:11 PM  Available via Epic secure chat 7am-7pm After 7 pm, please refer to night coverage provider listed on amion.

## 2023-10-10 NOTE — Progress Notes (Signed)
 Daily Progress Note   Patient Name: April [name]       Date: 10/10/2023 DOB: 05-31-1975  Age: 48 y.o. MRN#: [id number] Attending Physician: Davia Nydia POUR, MD Primary Care Physician: Oris Camie BRAVO, NP Admit Date: 10/03/2023 Length of Stay: 6 days  Reason for Consultation/Follow-up: Establishing goals of care, Non pain symptom management, and Pain control  Subjective:   CC: Patient resting comfortably when seen.  Still has NG tube in place for suction.  Following up regarding complex medical decision making and symptom management.  Subjective:  Reviewed EMR including recent documentation from hospitalist.  At time of EMR review in past 24 hours patient has received as needed IV Dilaudid  via PCA 1.5 mg bolus x 25 doses.  Patient continues to receive continuous infusion via PCA at 2 mg/h.Patient also receiving fentanyl  patch 200 mcg/h every 72 hours.  Patient has also received as needed IV Ativan  1 mg x 5 doses in the past 24 hours.  Reviewed I's and O's noting 7 L output via NG tube in past 24 hours.  Presented to bedside to see patient.  Patient sleeping comfortably when present so did not awaken.  No visitors present at bedside.  Discussed care with RN for medical updates.  No concerns for medication adjustments needed at this time.  Plan remains for patient to go home at earliest on Tuesday with support through [facility].  Plan would be for patient to continue having NG tube placed for suction at home.  Would continue to use the PCA for comfort.  Objective:   Vital Signs:  BP 126/85   Pulse 98   Temp (!) 97.2 F (36.2 C) (Oral)   Resp (!) 6   Ht 5' 4 (1.626 m)   Wt 54.1 kg   LMP 01/07/2006   SpO2 100%   BMI 20.47 kg/m   Physical Exam: General: NAD, sleeping comfortably, chronically ill-appearing, frail Cardiovascular: RRR Respiratory: no increased work of breathing noted, not in respiratory distress Neuro: Sleeping comfortably Psych:  appropriately answers all questions  Assessment & Plan:   Assessment: Patient is a 48 y.o. 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 Palliative medicine consulted for medical decision making assistance and symptom management.   Recommendations/Plan: # Complex medical decision making/goals of care:  - Patient was transition to full comfort focused care on 10/06/2023.  Have been working to manage symptoms so patient could go home with hospice support.  Unfortunately patient has extensive abdominal involvement of cancer causing issues with obstruction.  Patient currently has NG tube in place with suction for symptom management.  Have already discussed with IR and unable to place venting PEG tube due to anatomy, even for comfort. [facility] unable to support suctioning for NG tube to allow patient to go home. [facility] planning to support patient continuing NG tube placement with suction at home though the earliest that the equipment for this could be obtained is next Tuesday, potentially Wednesday.  Have already discussed with patient and she would want to stay in the hospital until she can go home with [facility] supporting continuing her NG tube with suctioning.  Palliative medicine team continuing to assist with discussions as able and appropriate moving forward.  -  Code Status: Do not attempt resuscitation (  DNR) - Comfort care  # Symptom management: Patient is receiving these palliative interventions for symptom management with an intent to improve quality of life.   - Pain, severe acute on chronic pain in setting of metastatic colorectal cancer and now comfort focused care   - Continue dexamethasone  4 mg IV daily   - Continue fentanyl  patch 200 mcg/hr  every 72 hours   - Continue IV Dilaudid  PCA with continuous basal infusion of 2 mg/h and bolus dosing of 1.5 mg every 10 minutes as needed.   - Constipation, severe   - Continue lactulose  30 mg daily in a.m. and 10 mg nightly   - Anxiety   - Continue IV Ativan  1 mg every 4 hours as needed   - Start Ativan  oral solution 1 mg every 4 hours as needed to determine patient's ability to tolerate as would be home medication.   - Nausea/vomiting   - Patient currently has NG tube in place with suction.  IR stating patient not a candidate for venting PEG tube even for comfort due to anatomy.   - Continue octreotide  to 100 mcg subcu every 8 hours  # Psychosocial Support:  - Mother, daughter  # Discharge Planning: Home with [facility]ospice through [facility] [place], at the earliest on Tuesday, 10/13/2023.  Patient needs equipment for NG tube suctioning at home before can discharge.  Thank you for allowing the palliative care team to participate in the care [facility].  Tinnie Radar, DO Palliative Care Provider PMT # [phone]  If patient remains symptomatic despite maximum doses, please call PMT at [phone] between 0700 and 1900. Outside of these hours, please call attending, as PMT does not have night coverage.

## 2023-10-10 NOTE — Plan of Care (Signed)
  Problem: Education: Goal: Knowledge of General Education information will improve Description: Including pain rating scale, medication(s)/side effects and non-pharmacologic comfort measures Outcome: Progressing   Problem: Health Behavior/Discharge Planning: Goal: Ability to manage health-related needs will improve Outcome: Progressing   Problem: Coping: Goal: Level of anxiety will decrease Outcome: Progressing   Problem: Nutrition: Goal: Adequate nutrition will be maintained Outcome: Not Progressing   Problem: Elimination: Goal: Will not experience complications related to bowel motility Outcome: Not Progressing

## 2023-10-10 NOTE — Plan of Care (Signed)

## 2023-10-11 ENCOUNTER — Other Ambulatory Visit: Payer: Self-pay | Admitting: Oncology

## 2023-10-11 ENCOUNTER — Inpatient Hospital Stay (HOSPITAL_COMMUNITY)

## 2023-10-11 DIAGNOSIS — C189 Malignant neoplasm of colon, unspecified: Secondary | ICD-10-CM | POA: Diagnosis not present

## 2023-10-11 DIAGNOSIS — R4589 Other symptoms and signs involving emotional state: Secondary | ICD-10-CM

## 2023-10-11 DIAGNOSIS — E43 Unspecified severe protein-calorie malnutrition: Secondary | ICD-10-CM | POA: Diagnosis not present

## 2023-10-11 DIAGNOSIS — C787 Secondary malignant neoplasm of liver and intrahepatic bile duct: Secondary | ICD-10-CM | POA: Diagnosis not present

## 2023-10-11 DIAGNOSIS — G893 Neoplasm related pain (acute) (chronic): Secondary | ICD-10-CM | POA: Diagnosis not present

## 2023-10-11 DIAGNOSIS — R109 Unspecified abdominal pain: Secondary | ICD-10-CM | POA: Diagnosis not present

## 2023-10-11 DIAGNOSIS — R112 Nausea with vomiting, unspecified: Secondary | ICD-10-CM | POA: Diagnosis not present

## 2023-10-11 DIAGNOSIS — Z515 Encounter for palliative care: Secondary | ICD-10-CM | POA: Diagnosis not present

## 2023-10-11 MED ORDER — SIMETHICONE 40 MG/0.6ML PO SUSP: 40.0000 mg | Freq: Four times a day (QID) | ORAL | Status: DC | PRN

## 2023-10-11 MED ORDER — BISACODYL 10 MG RE SUPP: 10.0000 mg | Freq: Every day | RECTAL | Status: DC | PRN

## 2023-10-11 MED ORDER — HYDROMORPHONE BOLUS VIA INFUSION
1.0000 mg | INTRAVENOUS | Status: DC | PRN
  Administered 2023-10-11: 4 mg via INTRAVENOUS
  Administered 2023-10-11: 2 mg via INTRAVENOUS
  Administered 2023-10-11: 4 mg via INTRAVENOUS
  Administered 2023-10-11 (×2): 5 mg via INTRAVENOUS

## 2023-10-11 MED ORDER — LORAZEPAM 2 MG/ML IJ SOLN
1.0000 mg | INTRAMUSCULAR | Status: DC
  Administered 2023-10-11 – 2023-10-14 (×16): 1 mg via INTRAVENOUS
  Filled 2023-10-11 (×16): qty 1

## 2023-10-11 MED ORDER — HYDROMORPHONE BOLUS VIA INFUSION
5.0000 mg | INTRAVENOUS | Status: DC | PRN
  Administered 2023-10-12 (×5): 5 mg via INTRAVENOUS

## 2023-10-11 MED ORDER — GLYCOPYRROLATE 0.2 MG/ML IJ SOLN: 0.2000 mg | INTRAMUSCULAR | Status: DC | PRN

## 2023-10-11 MED ORDER — POLYVINYL ALCOHOL 1.4 % OP SOLN: 1.0000 [drp] | Freq: Four times a day (QID) | OPHTHALMIC | Status: DC | PRN

## 2023-10-11 MED ORDER — BIOTENE DRY MOUTH MT LIQD: 15.0000 mL | OROMUCOSAL | Status: DC | PRN

## 2023-10-11 MED ORDER — HYDROMORPHONE HCL-NACL 50-0.9 MG/50ML-% IV SOLN
15.0000 mg/h | INTRAVENOUS | Status: DC
  Administered 2023-10-11: 6 mg/h via INTRAVENOUS
  Administered 2023-10-11: 8 mg/h via INTRAVENOUS
  Administered 2023-10-11: 2 mg/h via INTRAVENOUS
  Administered 2023-10-12: 9 mg/h via INTRAVENOUS
  Administered 2023-10-12: 8 mg/h via INTRAVENOUS
  Filled 2023-10-11 (×5): qty 50

## 2023-10-11 MED ORDER — LORAZEPAM 2 MG/ML IJ SOLN: 1.0000 mg | INTRAMUSCULAR | Status: DC

## 2023-10-11 NOTE — Progress Notes (Signed)
 [facility]                                                                              April [name], is a 48 y.o. 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 She has had multiple hospitalizations for similar especially throughout this year. Palliative medicine, oncology following for assistance with pain management and ongoing GOC discussions.   Assessment & Plan      Intractable abdominal pain Cancer associated pain - History of stage IV colon cancer with peritoneal carcinomatosis - Uncontrolled pain at home prompting admission as similar to prior - Currently on Dilaudid  PCA, decadron , fentanyl  patch - Not doing too well, appears very uncomfortable today, palliative medicine following started on IV Dilaudid  drip   Nausea and vomiting, severe constipation - Continue octreotide  - Ongoing goals of care discussion, appreciate oncology and palliative medicine following.  Per IR, not a candidate for venting PEG tube due to anatomy.  - NGT to intermittent wall suction, overnight having nausea and worsening pain.  KUB showed SBO versus ileus. - Palliative medicine following, now comfort care, started on IV Dilaudid  drip   Hypertensive urgency - suspect partially pain associated as well, continue pain management - On scheduled IV metoprolol  every 6 hours, clonidine  patch 0.3 mg transdermal weekly, scheduled hydralazine  every 8 hours, IV labetalol  as needed - comfort care    Hypokalemia -Replete as needed   Abnormal LFTs, stage IV colon cancer - Due to liver metastasis, on octreotide       Estimated body mass index is 20.47 kg/m as calculated from the following:   Height as of this encounter: 5' 4 2023/11/03 m).   Weight as of this encounter: 54.1 kg.  Code Status: DNR DVT Prophylaxis:  SCDs Start: 10/03/23 2004   Level of Care: Level of care: Stepdown Family Communication:  Disposition Plan:      Remains inpatient appropriate: Anticipating hospital death, started on IV Dilaudid  drip today   Procedures:  Consultants:   Palliative medicine, oncology  Antimicrobials:   Anti-infectives (From admission, onward)    None          Medications  cloNIDine   0.3 mg Transdermal Weekly   dexamethasone  (DECADRON ) injection  4 mg Intravenous q morning   fentaNYL   2 patch Transdermal Q72H   hydrALAZINE   10 mg Intravenous Q8H   senna  3 tablet Oral Daily   sodium chloride  flush  10-40 mL Intracatheter Q12H      Subjective:   April [name] was seen and examined today.  Overnight miserable with nausea and pain, lethargic this morning.  NGT to intermittent wall suction.  Objective:   Vitals:   10/11/23 0900 10/11/23 1000 10/11/23 1100 10/11/23 1200  BP: 105/67 113/88    Pulse: (!) 134 (!) 145 (!) 143 (!) 147  Resp: (!) 9 (!) 9 17 14   Temp:      TempSrc:      SpO2: 93% 91% (!) 87% 95%  Weight:      Height:        Intake/Output Summary (Last 24 hours) at 10/11/2023 1256 Last data filed at 10/11/2023 1218 Gross per 24 hour  Intake 14.14 ml  Output 1975 ml  Net -1960.86 ml     Wt Readings from Last 3 Encounters:  10/05/23 54.1 kg  09/30/23 51.8 kg  09/20/23 54.3 kg    Physical Exam General: Somnolent and lethargic, ill-appearing, frail, NGT+ Cardiovascular: S1 S2 clear, RRR.  Respiratory: CTAB Gastrointestinal: Soft, distended, diffuse TTP Ext: no pedal edema bilaterally Neuro: no new deficits Psych:  Lethargic    Data Reviewed:  I have personally reviewed following labs    CBC Lab Results  Component Value Date   WBC 10.3 10/09/2023   RBC 3.51 (L) 10/09/2023   HGB 9.9 (L) 10/09/2023   HCT 31.8 (L) 10/09/2023   MCV 90.6 10/09/2023   MCH 28.2 10/09/2023   PLT 349 10/09/2023   MCHC 31.1 10/09/2023   RDW 17.6 (H) 10/09/2023   LYMPHSABS 1.6 10/05/2023   MONOABS 0.2 10/05/2023   EOSABS 0.1 10/05/2023   BASOSABS 0.0 10/05/2023     Last metabolic panel Lab Results  Component Value Date   NA 138 10/09/2023   K 3.5 10/09/2023   CL 97 (L) 10/09/2023   CO2 30 10/09/2023   BUN 9 10/09/2023   CREATININE 0.62 10/09/2023   GLUCOSE 85 10/09/2023   GFRNONAA >60 10/09/2023   GFRAA >60 10/17/2019   CALCIUM  8.9 10/09/2023   PHOS 2.7 10/09/2023   PROT 7.2 10/04/2023   ALBUMIN 3.2 (L) 10/09/2023   BILITOT 1.2 10/04/2023   ALKPHOS 617 (H) 10/04/2023   AST 149 (H) 10/04/2023   ALT 75 (H) 10/04/2023   ANIONGAP 11 10/09/2023    CBG (last 3)  No results for input(s): GLUCAP in the last 72 hours.    Coagulation Profile: No results for input(s): INR, PROTIME in the last 168 hours.   Radiology Studies: I have personally reviewed the imaging studies  DG Abd 1 View Result Date: 10/11/2023 EXAM: 1 VIEW XRAY OF THE ABDOMEN 10/11/2023 07:45:00 AM COMPARISON: 10/05/2023 CLINICAL HISTORY: Pt reported a sharp pain around her gastric tube and nausea FINDINGS: LINES, TUBES AND DEVICES: Enteric tube in place with tip and side hole overlying the stomach region. Biliary stent noted. BOWEL: Multiple dilated small bowel loops in central abdomen, increased since prior study. SOFT TISSUES: Bilateral nephrolithiasis. BONES: No acute osseous abnormality. IMPRESSION: 1. Multiple dilated small bowel loops in the central abdomen, increased from prior, concerning for small bowel obstruction vs. ileus. 2. Enteric tube with tip and side hole projecting over the stomach; biliary stent in place. 3.  Bilateral nephrolithiasis. Electronically signed by: Waddell Calk MD 10/11/2023 09:12 AM EDT RP Workstation: [id number]        Nydia Distance M.D. [facility] 10/11/2023, 12:56 PM  Available via Epic secure chat 7am-7pm After 7 pm, please refer to night coverage provider listed on amion.

## 2023-10-11 NOTE — Progress Notes (Addendum)
 Daily Progress Note   Patient Name: April [name]       Date: 10/11/2023 DOB: [birth year]  Age: 48 y.o. MRN#: [id number] Attending Physician: Davia Nydia POUR, MD Primary Care Physician: Oris Camie BRAVO, NP Admit Date: 10/03/2023 Length of Stay: 7 days  Reason for Consultation/Follow-up: Establishing goals of care, Non pain symptom management, and Pain control  Subjective:   CC: Patient deteriorating with worsening pain.  Following up regarding complex medical decision making and symptom management.  Subjective:  Reviewed EMR including recent documentation from hospitalist.  Discussed care with bedside RN for medical updates.  Patient has had worsening pain despite NG tube in place for suctioning.  RN noted now difficult to get suctioning despite multiple attempts to flush.  Concerned about patient's cancer progression and worsening of burden causing constriction and pain.  KUB obtained today personally reviewed showing concerns for worsening small bowel obstruction or ileus. At time of EMR review of past 24 hours, patient has required IV Dilaudid  PCA 2 mg bolus x 21 doses.  RN noted patient having pain despite this.  Patient also requiring as needed Ativan .  RN noted that despite medications for nausea, patient vomited this morning.  Presented to bedside to see patient multiple times in the morning.  Patient lethargic and becoming unresponsive.  Able to call patient's mother and updated her regarding patient's worsening medical status.  Expressed concern that patient is dying and would not be able to make it home with hospice as had planned.  Discussed providing medications appropriately for comfort at end-of-life.  Mother appropriately tearful over the phone.  Encouraged that if there is family that want to come to bedside to say goodbye, now would be the time.  Mother noted she would reach out to patient's children.  Mother agreeing with plan for comfort.  All questions answered at  that time.  Provide emotional support via active listening.  Presented to bedside later on in day once informed patient's children were at bedside.  When presenting, present was patient's daughter and son.  Introduced myself as a member of the palliative medicine team.  Again spent time providing medical updates had discussed with patient's mother.  Expressed concern that patient is dying and that she will not get home with hospice as had originally planned.  At this time anticipating hospital death.  Discussed providing medications for comfort at end-of-life.  Spent time answering questions as able.  Continuing medications for comfort at end-of-life knowing time is growing short.  Discussed palliative medicine team continue to follow along with patient's medical journey.  Discussed care with hospitalist and RN during the day to coordinate care.   UPDATE: Presented to bedside later on in day to see patient. Received updates from RN. Patient up to IV dilaudid  infusion 6mg /hr and receiving bolus dosing of 5mg  q 15 mins prn. RN noted that multiple family members  have visited with patient today.  When seeing patient, she is awake laying in bed. Able to follow up on her symptom management. Patient states she is still in horrible pain. Explained imaging showing SBO/ileus. Explained that based on current evaluation, patient is not going to make it home. Patient needs aggressive symptom management at the end of life. Patient acknowledged this. Discussed priority is for patient not to suffer knowing she is dying. Patient agreeing with pain management being the priority. Patient also having lots of anxiety. Spent time providing emotional support via active listening. Also offered can schedule Ativan  to help with management.  Patient agreeing with this plan. The ativan  only last about 4 hours, if that, so note would schedule Ativan  every 4 hours. This can be adjusted if needed. Spent time answering patient's questions.  Patient voiced appreciation for the care. Noted PMT would continue to follow along with patient's medical journey.   Objective:   Vital Signs:  BP (!) 135/106 (BP Location: Right Wrist)   Pulse (!) 128   Temp (!) 97.5 F (36.4 C) (Axillary)   Resp (!) 93   Ht 5' 4 (1.626 m)   Wt 54.1 kg   LMP 01/07/2006   SpO2 93%   BMI 20.47 kg/m   Physical Exam: General: NAD, lethargic and at times unresponsive, ill-appearing, frail Cardiovascular: Tachycardia noted Respiratory: increased work of breathing noted, not in respiratory distress Neuro: Lethargic and at times unresponsive  Assessment & Plan:   Assessment: Patient is a 48 y.o. 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 Palliative medicine consulted for medical decision making assistance and symptom management.   Recommendations/Plan: # Complex medical decision making/goals of care:  - Patient was transitioned to full comfort focused care on 10/06/2023.  Patient's medical status continuing to deteriorate particularly over the past 24 hours.  Now concern for small bowel obstruction despite having NG tube for suction.  Discussed with patient's mother and children at bedside concern that patient is dying and anticipate hospital death at this time.  Providing medications needed for comfort at end-of-life knowing that time is growing short.  Have encouraged family to visit to say goodbye.  Palliative medicine team continuing to assist with discussions as able and appropriate moving forward.  -  Code Status: Do not attempt resuscitation (DNR) - Comfort care  # Symptom management: Patient is receiving these palliative interventions for symptom management with an intent to improve quality of life.   - Pain, severe acute on chronic  pain in setting of metastatic colorectal cancer and now comfort focused care Within the past 24 hours, patient has received IV Dilaudid  bolus dosing via PCA 2 mg x 21 doses.  Patient has also been receiving IV Dilaudid  continuous infusion from PCA at 2 mg/h.   - Discontinue dexamethasone  4 mg IV daily   - Continue fentanyl  patch 200 mcg/hr every 72 hours   - Discontinue IV Dilaudid  PCA as patient no longer to participate in using PCA bolus dosing   - Start IV Dilaudid  continuous infusion at 2 mg/h with RN bolus dosing every 15 minute. Patient seen later in day and up to 6mg /hr continuous infusion with worsening pain. Noted increase of continuous infusion dose to 8mg /hr and allowing up to 15mg /hr. Continue to titrate dose based on patient's symptom response.    -May need to consider further medications such as propofol  for symptom management at the end of life.    - Constipation, severe   - Discontinue oral lactulose    - Anxiety   - Continue IV Ativan  1 mg every 4 hours as needed   -Discussed with patient later in day and will schedule IV Ativan  1mg  q 4 hours. Continue to adjust based on patient's symptom burden.    - Discontinue Ativan  oral solution   - Nausea/vomiting   - Patient currently has NG tube in place with suction that is no longer assisting with management and patient developing  SBO/ileus despite this   - Discontinue octreotide    # Psychosocial Support:  - Mother, daughter  # Discharge Planning: Anticipated Hospital Death   - Had been working to get patient home with hospice through [facility] to continue NG tube suctioning.  Patient unfortunately has continued to medically deteriorate and based on current evaluation, anticipate in-hospital death.  Thank you for allowing the palliative care team to participate in the care [facility].  Tinnie Radar, DO Palliative Care Provider PMT # [phone]  If patient remains symptomatic despite maximum doses, please call PMT  at [phone] between 0700 and 1900. Outside of these hours, please call attending, as PMT does not have night coverage.  Personally spent 80 minutes in patient care including extensive chart review (labs, imaging, progress/consult notes, vital signs), medically appropraite exam, discussed with treatment team, education to patient, family, and staff, documenting clinical information, medication review and management, coordination of care, and available advanced directive documents.

## 2023-10-11 NOTE — Plan of Care (Signed)

## 2023-10-11 NOTE — Progress Notes (Signed)
   10/11/23 1100  Spiritual Encounters  Type of Visit Follow up  Care provided to: Pt and family  Referral source Nurse (RN/NT/LPN)  Reason for visit End-of-life  OnCall Visit Yes  Spiritual Framework  Presenting Themes Meaning/purpose/sources of inspiration;Coping tools;Courage hope and growth;Rituals and practive   Chaplain was  called for follow up visit with EOL patient - met with family at bedside and priovided spiritual care and comfort.

## 2023-10-12 DIAGNOSIS — Z711 Person with feared health complaint in whom no diagnosis is made: Secondary | ICD-10-CM

## 2023-10-12 DIAGNOSIS — Z66 Do not resuscitate: Secondary | ICD-10-CM

## 2023-10-12 DIAGNOSIS — C787 Secondary malignant neoplasm of liver and intrahepatic bile duct: Secondary | ICD-10-CM | POA: Diagnosis not present

## 2023-10-12 DIAGNOSIS — Z79899 Other long term (current) drug therapy: Secondary | ICD-10-CM

## 2023-10-12 DIAGNOSIS — G893 Neoplasm related pain (acute) (chronic): Secondary | ICD-10-CM | POA: Diagnosis not present

## 2023-10-12 DIAGNOSIS — C189 Malignant neoplasm of colon, unspecified: Secondary | ICD-10-CM | POA: Diagnosis not present

## 2023-10-12 DIAGNOSIS — E43 Unspecified severe protein-calorie malnutrition: Secondary | ICD-10-CM | POA: Diagnosis not present

## 2023-10-12 DIAGNOSIS — Z7189 Other specified counseling: Secondary | ICD-10-CM

## 2023-10-12 DIAGNOSIS — R4589 Other symptoms and signs involving emotional state: Secondary | ICD-10-CM

## 2023-10-12 DIAGNOSIS — R109 Unspecified abdominal pain: Secondary | ICD-10-CM | POA: Diagnosis not present

## 2023-10-12 DIAGNOSIS — Z515 Encounter for palliative care: Secondary | ICD-10-CM | POA: Diagnosis not present

## 2023-10-12 MED ORDER — SODIUM CHLORIDE 0.9 % IV SOLN
15.0000 mg/h | INTRAVENOUS | Status: DC
  Administered 2023-10-12: 17 mg/h via INTRAVENOUS
  Administered 2023-10-12: 16 mg/h via INTRAVENOUS
  Administered 2023-10-13: 19.5 mg/h via INTRAVENOUS
  Administered 2023-10-13 – 2023-10-14 (×3): 21 mg/h via INTRAVENOUS
  Filled 2023-10-12 (×8): qty 20

## 2023-10-12 MED ORDER — HYDROMORPHONE BOLUS VIA INFUSION
10.0000 mg | INTRAVENOUS | Status: DC | PRN
  Administered 2023-10-12 – 2023-10-13 (×8): 10 mg via INTRAVENOUS

## 2023-10-12 NOTE — Progress Notes (Signed)
 [facility]                                                                              April [name], is a 48 y.o. 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 She has had multiple hospitalizations for similar especially throughout this year. Palliative medicine, oncology following for assistance with pain management and ongoing GOC discussions.   Assessment & Plan   Intractable abdominal pain Cancer associated pain - History of stage IV colon cancer with peritoneal carcinomatosis - Uncontrolled pain at home prompting admission as similar to prior - On Dilaudid  drip, comfort care    Nausea and vomiting, severe constipation - Continue octreotide  - Ongoing goals of care discussion, appreciate oncology and palliative medicine following.  Per IR, not a candidate for venting PEG tube due to anatomy.  - On NGT to wall suction, KUB on 9/28 showed SBO versus ileus. - Palliative medicine following, now comfort care, started on IV Dilaudid  drip   Hypertensive urgency - suspect partially pain associated as well, continue pain management - On scheduled IV metoprolol  every 6 hours, clonidine  patch 0.3 mg transdermal weekly, scheduled hydralazine  every 8 hours, IV labetalol  as needed - Now comfort care     Abnormal LFTs, stage IV colon cancer - Due to liver metastasis, on octreotide       Estimated body mass index is 20.47 kg/m as calculated from the following:   Height as of this  encounter: 5' 4 10/23/2023 m).   Weight as of this encounter: 54.1 kg.  Code Status: DNR DVT Prophylaxis:  SCDs Start: 10/03/23 2004   Level of Care: Level of care: Stepdown Family Communication: Daughter at the bedside Disposition Plan:      Remains inpatient appropriate: Anticipating hospital death, started on IV Dilaudid  drip   Procedures:  Consultants:   Palliative medicine, oncology  Antimicrobials:   Anti-infectives (From admission, onward)    None          Medications  cloNIDine   0.3 mg Transdermal Weekly   fentaNYL   2 patch Transdermal Q72H   LORazepam   1 mg Intravenous Q4H   sodium chloride  flush  10-40 mL Intracatheter Q12H      Subjective:   April [name] was seen and examined today.  Seen this morning, tachycardiac but pain somewhat controlled on Dilaudid  drip.  Alert and awake, daughter at the bedside.    Objective:   Vitals:   10/12/23 0800 10/12/23 0900 10/12/23 1000 10/12/23 1100  BP:      Pulse: (!) 150 (!) 157 (!) 143 (!) 142  Resp: (!) 7 16 10 11   Temp:  TempSrc:      SpO2: (!) 83% 96% 93% (!) 88%  Weight:      Height:        Intake/Output Summary (Last 24 hours) at 10/12/2023 1212 Last data filed at 10/12/2023 1100 Gross per 24 hour  Intake 440.71 ml  Output 2700 ml  Net -2259.29 ml     Wt Readings from Last 3 Encounters:  10/05/23 54.1 kg  09/30/23 51.8 kg  09/20/23 54.3 kg   Physical Exam General: Awake, uncomfortable, frail, ill-appearing, NGT+ Cardiovascular: Tachycardia Respiratory: CTAB, no wheezing, rales or rhonchi Gastrointestinal: Diffuse TTP, distended Ext: no pedal edema bilaterally Psych: awake    Data Reviewed:  I have personally reviewed following labs    CBC Lab Results  Component Value Date   WBC 10.3 10/09/2023   RBC 3.51 (L) 10/09/2023   HGB 9.9 (L) 10/09/2023   HCT 31.8 (L) 10/09/2023   MCV 90.6 10/09/2023   MCH 28.2 10/09/2023   PLT 349 10/09/2023   MCHC 31.1 10/09/2023   RDW 17.6  (H) 10/09/2023   LYMPHSABS 1.6 10/05/2023   MONOABS 0.2 10/05/2023   EOSABS 0.1 10/05/2023   BASOSABS 0.0 10/05/2023     Last metabolic panel Lab Results  Component Value Date   NA 138 10/09/2023   K 3.5 10/09/2023   CL 97 (L) 10/09/2023   CO2 30 10/09/2023   BUN 9 10/09/2023   CREATININE 0.62 10/09/2023   GLUCOSE 85 10/09/2023   GFRNONAA >60 10/09/2023   GFRAA >60 10/17/2019   CALCIUM  8.9 10/09/2023   PHOS 2.7 10/09/2023   PROT 7.2 10/04/2023   ALBUMIN 3.2 (L) 10/09/2023   BILITOT 1.2 10/04/2023   ALKPHOS 617 (H) 10/04/2023   AST 149 (H) 10/04/2023   ALT 75 (H) 10/04/2023   ANIONGAP 11 10/09/2023    CBG (last 3)  No results for input(s): GLUCAP in the last 72 hours.    Coagulation Profile: No results for input(s): INR, PROTIME in the last 168 hours.   Radiology Studies: I have personally reviewed the imaging studies  DG Abd 1 View Result Date: 10/11/2023 EXAM: 1 VIEW XRAY OF THE ABDOMEN 10/11/2023 07:45:00 AM COMPARISON: 10/05/2023 CLINICAL HISTORY: Pt reported a sharp pain around her gastric tube and nausea FINDINGS: LINES, TUBES AND DEVICES: Enteric tube in place with tip and side hole overlying the stomach region. Biliary stent noted. BOWEL: Multiple dilated small bowel loops in central abdomen, increased since prior study. SOFT TISSUES: Bilateral nephrolithiasis. BONES: No acute osseous abnormality. IMPRESSION: 1. Multiple dilated small bowel loops in the central abdomen, increased from prior, concerning for small bowel obstruction vs. ileus. 2. Enteric tube with tip and side hole projecting over the stomach; biliary stent in place. 3. Bilateral nephrolithiasis. Electronically signed by: Waddell Calk MD 10/11/2023 09:12 AM EDT RP Workstation: [id number]        Nydia Distance M.D. [facility] 10/12/2023, 12:12 PM  Available via Epic secure chat 7am-7pm After 7 pm, please refer to night coverage provider listed on amion.

## 2023-10-12 NOTE — Plan of Care (Signed)
  Problem: Education: Goal: Knowledge of General Education information will improve Description: Including pain rating scale, medication(s)/side effects and non-pharmacologic comfort measures Outcome: Progressing   Problem: Health Behavior/Discharge Planning: Goal: Ability to manage health-related needs will improve Outcome: Progressing   Problem: Clinical Measurements: Goal: Will remain free from infection Outcome: Progressing Goal: Respiratory complications will improve Outcome: Progressing Goal: Cardiovascular complication will be avoided Outcome: Progressing   Problem: Activity: Goal: Risk for activity intolerance will decrease Outcome: Progressing   Problem: Coping: Goal: Level of anxiety will decrease Outcome: Progressing   Problem: Pain Managment: Goal: General experience of comfort will improve and/or be controlled Outcome: Progressing   Problem: Safety: Goal: Ability to remain free from injury will improve Outcome: Progressing   Problem: Skin Integrity: Goal: Risk for impaired skin integrity will decrease Outcome: Progressing   Problem: Clinical Measurements: Goal: Ability to maintain clinical measurements within normal limits will improve Outcome: Not Progressing   Problem: Nutrition: Goal: Adequate nutrition will be maintained Outcome: Not Progressing   Cindy S. Loreli BSN, RN, CCRP, CCRN 10/12/2023 4:25 AM

## 2023-10-12 NOTE — Progress Notes (Signed)
 I followed up with Kamill's family after chaplain Mallie visited them yesterday.  They stated that for now they are doing okay and do not have any needs at this time.  Please page if needs arise or if family requests.  [address] [place] Pager, [phone]

## 2023-10-12 NOTE — Progress Notes (Signed)
 Daily Progress Note   Patient Name: April [name]       Date: 10/12/2023 DOB: 10-29-1975  Age: 48 y.o. MRN#: [id number] Attending Physician: Davia Nydia POUR, MD Primary Care Physician: Oris Camie BRAVO, NP Admit Date: 10/03/2023 Length of Stay: 8 days  Reason for Consultation/Follow-up: Establishing goals of care, Non pain symptom management, and Pain control  Subjective:   CC: Patient has continued to have worsening pain though does feel adjustments of medications help.  Following up regarding complex medical decision making and symptom management.  Subjective:  Reviewed EMR including recent hospitalist documentation.  At time of EMR review patient receiving IV Dilaudid  continuous infusion 8 mg/h.  Patient has required IV Dilaudid  bolus of 5 mg every 15 minute as needed x 10 in the past 24 hours.  Patient was started on IV Ativan  1 mg every 4 hours scheduled on 10/11/2023.  In past 24 hours patient has received as needed IV Ativan  1 mg x 3 doses. Discussed care with bedside RN for medical updates.  RN noted patient continues to express worsening pain in her abdomen and that the bolus doses are no longer assisting with management.  Adjusted continuous IV infusion dosing to allow for further titration.  Also increased patient's IV Dilaudid  bolus dosing.  When seen patient, she notes that with pain medication adjustments, she is finally getting some pain relief.  She requested a very cold drink at that time.  Was acknowledged and provided by family at bedside.  Family appropriately supporting and grieving at bedside.  Spent time answering questions as able.  Noted palliative medicine team will continue to follow along with patient's medical journey.  Discussed care with RN throughout the day to assist with symptom management and end-of-life.  Objective:   Vital Signs:  BP 113/79   Pulse (!) 143   Temp 97.9 F (36.6 C) (Oral)   Resp 20   Ht 5' 4 (1.626 m)   Wt 54.1 kg   LMP  01/07/2006   SpO2 100%   BMI 20.47 kg/m   Physical Exam: General: NAD, lethargic and at times unresponsive, ill-appearing, frail Cardiovascular: Tachycardia noted Respiratory: increased work of breathing noted, not in respiratory distress Neuro: Lethargic and at times unresponsive  Assessment & Plan:   Assessment: Patient is a 48 y.o. 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 Palliative medicine consulted for medical decision making assistance and symptom management.   Recommendations/Plan: # Complex medical decision making/goals of care:  - Patient was transitioned to full comfort focused care on 10/06/2023.  Patient's medical status has continued to deteriorate and she developed concern for small bowel obstruction over the weekend despite having NG tube for suction.  Have discussed with patient, patient's mother, and patient's children that patient is needing such high level of symptom management that she cannot return home to receive this level of care and at this time anticipate in-hospital death.  Palliative medicine team continuing to assist with discussions as able and appropriate moving forward.  -  Code Status: Do not attempt resuscitation (DNR) - Comfort care  # Symptom management: Patient is receiving these palliative interventions for symptom management with an intent to improve quality of life.   - Pain, severe acute on chronic pain in setting of metastatic colorectal cancer and now comfort focused care   -  Continue fentanyl  patch 200 mcg/hr every 72 hours   - Increase IV Dilaudid  continuous infusion to 15-25mg /hr titratable dose with bolus dosing of 10 mg every 15 minute.  Patient has had continued severe pain despite multiple adjustments.  Trying to balance  patient having time awake with family versus severe pain management though priority at this time for patient and family is that patient be comfortable at the end of life.    -May need to consider further medications such as propofol  for symptom management at the end of life.    - Anxiety   - Continue IV Ativan  1 mg every 4 hours as needed   - Continue IV Ativan  1mg  q 4 hours. Continue to adjust based on patient's symptom burden.    - Nausea/vomiting   - Patient currently has NG tube in place with suction that is not assisting well with pain management as patient developing SBO/ileus despite this  # Psychosocial Support:  - Mother, daughter  # Discharge Planning: Anticipated Hospital Death   - Had been working to get patient home with hospice through [facility] to continue NG tube suctioning.  Patient unfortunately has continued to medically deteriorate and based on current evaluation, anticipate in-hospital death.  Thank you for allowing the palliative care team to participate in the care [facility].  Tinnie Radar, DO Palliative Care Provider PMT # [phone]  If patient remains symptomatic despite maximum doses, please call PMT at [phone] between 0700 and 1900. Outside of these hours, please call attending, as PMT does not have night coverage.  Billing based on MDM: High  Problems Addressed: One or more chronic illnesses with severe exacerbation, progression, or side effects of treatment.  Risks: Parenteral controlled substances

## 2023-10-13 DIAGNOSIS — Z515 Encounter for palliative care: Secondary | ICD-10-CM | POA: Diagnosis not present

## 2023-10-13 DIAGNOSIS — G893 Neoplasm related pain (acute) (chronic): Secondary | ICD-10-CM | POA: Diagnosis not present

## 2023-10-13 DIAGNOSIS — R109 Unspecified abdominal pain: Secondary | ICD-10-CM | POA: Diagnosis not present

## 2023-10-13 DIAGNOSIS — C189 Malignant neoplasm of colon, unspecified: Secondary | ICD-10-CM | POA: Diagnosis not present

## 2023-10-13 NOTE — Progress Notes (Signed)
 Daily Progress Note   Patient Name: April [name]       Date: 10/13/2023 DOB: 01-13-1976  Age: 48 y.o. MRN#: [id number] Attending Physician: Davia Nydia POUR, MD Primary Care Physician: Oris Camie BRAVO, NP Admit Date: 10/03/2023 Length of Stay: 9 days  Reason for Consultation/Follow-up: Establishing goals of care, Non pain symptom management, and Pain control  Subjective:   CC: Patient has continued to have worsening pain though does feel adjustments of medications help.  Following up regarding complex medical decision making and symptom management.  Subjective:  Reviewed EMR including recent hospitalist documentation.  Patient actively dying Essentially unresponsive at present, monitor noted.  Noted palliative medicine team will continue to follow along with patient's medical journey.  Discussed care with RN this am to assist with symptom management and end-of-life.  Objective:   Vital Signs:  BP (!) 126/91   Pulse (!) 148   Temp 97.9 F (36.6 C) (Oral)   Resp (!) 6   Ht 5' 4 (1.626 m)   Wt 54.1 kg   LMP 01/07/2006   SpO2 (!) 66%   BMI 20.47 kg/m   Physical Exam: General: NAD, lethargic unresponsive, ill-appearing, frail Cardiovascular: Tachycardia noted Respiratory: increased work of breathing noted, not in respiratory distress Neuro: Lethargic unresponsive Monitor noted.   Assessment & Plan:   Assessment: Patient is a 48 y.o. 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 Palliative medicine consulted for medical decision making assistance and symptom management.   Recommendations/Plan: # Complex medical decision making/goals of care:  - Patient was transitioned to full comfort focused care on 10/06/2023.  Patient's medical status has  continued to deteriorate and she developed concern for small bowel obstruction over the weekend despite having NG tube for suction.  Have discussed with patient, patient's mother, and patient's children that patient is needing such high level of symptom management that she cannot return home to receive this level of care and at this time anticipate in-hospital death.  Palliative medicine team continuing to assist with discussions as able and appropriate moving forward.  -  Code Status: Do not attempt resuscitation (DNR) - Comfort care  # Symptom management: Patient is receiving these palliative interventions for symptom management with an intent to provide comfort measures at end of life.   - Pain, severe acute on chronic pain in setting of metastatic colorectal cancer and now comfort focused care   - Continue fentanyl  patch 200 mcg/hr every 72 hours   - Increase IV Dilaudid  continuous infusion to 15-25mg /hr titratable dose with bolus dosing of 10 mg every 15 minute.  Patient has had continued severe pain despite multiple adjustments.         -May need to consider further medications such as propofol  for symptom management at the end of life.    - Anxiety   - Continue IV Ativan  1 mg every 4 hours as needed   - Continue IV Ativan  1mg  q 4 hours. Continue to adjust based on patient's symptom burden.   # Psychosocial Support:  - Mother, daughter  # Discharge Planning: Anticipated Hospital Death   It is no longer medically appropriate to get patient home with hospice through [facility] to continue NG tube suctioning.  Patient unfortunately has continued to medically deteriorate and based on current evaluation, anticipate in-hospital death.  Less  Than 2 Days Before Death In the final hours, patients exhibit specific clinical signs that indicate the approach of death.  Clinical Signs:  1.Death rattle  Management Techniques: Educate the family about the nature of the sound Reposition the patient to  promote drainage Consider using anticholinergic medications if the patient is suffering.  2.Apnea. Management Techniques:  Educate the family about the possibility of irregular breathing patterns Administer opioids if dyspnea is present.  3.Respiration with mandibular movement  Management Techniques: Educate the family about changes in breathing patterns.  4.Decreased urine output  Management Techniques: Educate the family about the natural decrease in bodily functions.  5.Pulselessness of radial artery  Management Techniques: Educate the family about changes in circulation and perfusion.  6.Inability to close eyelids  Management Techniques: Educate the family about the physical changes in muscle control Use a wet washcloth if eyes are dry or irritated.  7.Grunting of vocal cords  Management Techniques: Educate the family the family about the possibility of vocal cord tension Administer opioids if pain is present.  8.Fever  Management Techniques: Place a cool washcloth on the patient's forehead Remove excess blankets Use a fan Administer acetominophin if necessary   Thank you for allowing the palliative care team to participate in the care April [name].  Lonia Serve MD.  Palliative Care Provider PMT # [phone]  If patient remains symptomatic despite maximum doses, please call PMT at [phone] between 0700 and 1900. Outside of these hours, please call attending, as PMT does not have night coverage.  Billing based on MDM: High  Problems Addressed: One or more chronic illnesses with severe exacerbation, progression, or side effects of treatment.  Risks: Parenteral controlled substances

## 2023-10-13 NOTE — Plan of Care (Incomplete)
  Problem: Education: Goal: Knowledge of General Education information will improve Description: Including pain rating scale, medication(s)/side effects and non-pharmacologic comfort measures Outcome: Not Progressing   Problem: Health Behavior/Discharge Planning: Goal: Ability to manage health-related needs will improve Outcome: Not Progressing   Problem: Coping: Goal: Level of anxiety will decrease Outcome: Not Progressing   Problem: Elimination: Goal: Will not experience complications related to urinary retention Outcome: Not Progressing   Problem: Pain Managment: Goal: General experience of comfort will improve and/or be controlled Outcome: Not Progressing   Problem: Safety: Goal: Ability to remain free from injury will improve Outcome: Not Progressing   Problem: Skin Integrity: Goal: Risk for impaired skin integrity will decrease Outcome: Not Progressing   Problem: Education: Goal: Knowledge of the prescribed therapeutic regimen will improve Outcome: Not Progressing   Problem: Coping: Goal: Ability to identify and develop effective coping behavior will improve Outcome: Not Progressing   Problem: Clinical Measurements: Goal: Quality of life will improve Outcome: Not Progressing   Problem: Respiratory: Goal: Verbalizations of increased ease of respirations will increase Outcome: Not Progressing   Problem: Role Relationship: Goal: Family's ability to cope with current situation will improve Outcome: Not Progressing Goal: Ability to verbalize concerns, feelings, and thoughts to partner or family member will improve Outcome: Not Progressing   Problem: Pain Management: Goal: Satisfaction with pain management regimen will improve Outcome: Not Progressing

## 2023-10-13 NOTE — Progress Notes (Signed)
 [facility]                                                                              April [name], is a 48 y.o. 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 She has had multiple hospitalizations for similar especially throughout this year. Palliative medicine, oncology following for assistance with pain management and ongoing GOC discussions.   Assessment & Plan   Intractable abdominal pain Cancer associated pain - History of stage IV colon cancer with peritoneal carcinomatosis - Uncontrolled pain at home prompting admission as similar to prior - Palliative medicine following, on IV Dilaudid  drip, unresponsive, shallow breathing   Nausea and vomiting, severe constipation - Continue octreotide  - Ongoing goals of care discussion, appreciate oncology and palliative medicine following.  Per IR, not a candidate for venting PEG tube due to anatomy.  - On NGT to wall suction, KUB on 9/28 showed SBO versus ileus. - Palliative medicine following, now comfort care, started on IV Dilaudid  drip   Hypertensive urgency - suspect partially pain associated as well, continue pain management - On clonidine  patch 0.3 mg transdermal weekly - Now comfort care     Abnormal LFTs, stage IV colon cancer - Due to liver metastasis, on octreotide       Estimated body mass index is 20.47 kg/m as calculated from the following:   Height as of this encounter: 5' 4 11-13-2023 m).   Weight as of this  encounter: 54.1 kg.  Code Status: DNR DVT Prophylaxis:  SCDs Start: 10/03/23 2004   Level of Care: Level of care: Stepdown Family Communication: Daughter at the bedside on 9/29 Disposition Plan:      Remains inpatient appropriate: Anticipating hospital death, on IV Dilaudid  drip.  Poor prognosis, hours-day.   Procedures:  Consultants:   Palliative medicine, oncology  Antimicrobials:   Anti-infectives (From admission, onward)    None          Medications  cloNIDine   0.3 mg Transdermal Weekly   fentaNYL   2 patch Transdermal Q72H   LORazepam   1 mg Intravenous Q4H   sodium chloride  flush  10-40 mL Intracatheter Q12H      Subjective:   April [name] was seen and examined today.  Unresponsive, appears comfortable, shallow breathing.    Objective:   Vitals:   10/13/23 0300 10/13/23 0800 10/13/23 1100 10/13/23 1200  BP:      Pulse: (!) 139 (!) 148 (!) 154 (!) 158  Resp: 13 (!) 6 14 16   Temp:      TempSrc:      SpO2: (!) 76% (!) 66% (!) 60% (!) 70%  Weight:      Height:        Intake/Output Summary (Last 24 hours) at 10/13/2023 1207 Last data filed at 10/13/2023 0726 Gross per 24 hour  Intake 269.39 ml  Output 900 ml  Net -630.61 ml     Wt Readings from Last 3 Encounters:  10/05/23 54.1 kg  09/30/23 51.8 kg  09/20/23 54.3 kg    Physical Exam General: Unresponsive Cardiovascular: Tachycardia Respiratory: Did not assess Gastrointestinal: Did not assess Ext: no pedal edema bilaterally Neuro: Unresponsive    Data Reviewed:  I have personally reviewed following labs    CBC Lab Results  Component Value Date   WBC 10.3 10/09/2023   RBC 3.51 (L) 10/09/2023   HGB 9.9 (L) 10/09/2023   HCT 31.8 (L) 10/09/2023   MCV 90.6 10/09/2023   MCH 28.2 10/09/2023   PLT 349 10/09/2023   MCHC 31.1 10/09/2023   RDW 17.6 (H) 10/09/2023   LYMPHSABS 1.6 10/05/2023   MONOABS 0.2 10/05/2023   EOSABS 0.1 10/05/2023   BASOSABS 0.0 10/05/2023     Last  metabolic panel Lab Results  Component Value Date   NA 138 10/09/2023   K 3.5 10/09/2023   CL 97 (L) 10/09/2023   CO2 30 10/09/2023   BUN 9 10/09/2023   CREATININE 0.62 10/09/2023   GLUCOSE 85 10/09/2023   GFRNONAA >60 10/09/2023   GFRAA >60 10/17/2019   CALCIUM  8.9 10/09/2023   PHOS 2.7 10/09/2023   PROT 7.2 10/04/2023   ALBUMIN 3.2 (L) 10/09/2023   BILITOT 1.2 10/04/2023   ALKPHOS 617 (H) 10/04/2023   AST 149 (H) 10/04/2023   ALT 75 (H) 10/04/2023   ANIONGAP 11 10/09/2023    CBG (last 3)  No results for input(s): GLUCAP in the last 72 hours.    Coagulation Profile: No results for input(s): INR, PROTIME in the last 168 hours.   Radiology Studies: I have personally reviewed the imaging studies  No results found.       Nydia Distance M.D. [facility] 10/13/2023, 12:07 PM  Available via Epic secure chat 7am-7pm After 7 pm, please refer to night coverage provider listed on amion.

## 2023-10-13 NOTE — Plan of Care (Signed)
  Problem: Education: Goal: Knowledge of General Education information will improve Description: Including pain rating scale, medication(s)/side effects and non-pharmacologic comfort measures Outcome: Progressing   Problem: Health Behavior/Discharge Planning: Goal: Ability to manage health-related needs will improve Outcome: Progressing   Problem: Coping: Goal: Level of anxiety will decrease Outcome: Progressing   Problem: Elimination: Goal: Will not experience complications related to urinary retention Outcome: Progressing   Problem: Pain Managment: Goal: General experience of comfort will improve and/or be controlled Outcome: Progressing   Problem: Safety: Goal: Ability to remain free from injury will improve Outcome: Progressing   Problem: Skin Integrity: Goal: Risk for impaired skin integrity will decrease Outcome: Progressing   Problem: Education: Goal: Knowledge of the prescribed therapeutic regimen will improve Outcome: Progressing   Problem: Coping: Goal: Ability to identify and develop effective coping behavior will improve Outcome: Progressing   Problem: Clinical Measurements: Goal: Quality of life will improve Outcome: Progressing   Problem: Respiratory: Goal: Verbalizations of increased ease of respirations will increase Outcome: Progressing   Problem: Role Relationship: Goal: Family's ability to cope with current situation will improve Outcome: Progressing Goal: Ability to verbalize concerns, feelings, and thoughts to partner or family member will improve Outcome: Progressing   Problem: Pain Management: Goal: Satisfaction with pain management regimen will improve Outcome: Progressing   Problems and Goals listed below are not applicable for this comfort care patient: Problem: Clinical Measurements: Goal: Ability to maintain clinical measurements within normal limits will improve Outcome: Not Applicable Goal: Will remain free from  infection Outcome: Not Applicable Goal: Diagnostic test results will improve Outcome: Not Applicable Goal: Respiratory complications will improve Outcome: Not Applicable Goal: Cardiovascular complication will be avoided Outcome: Not Applicable   Problem: Activity: Goal: Risk for activity intolerance will decrease Outcome: Not Applicable   Problem: Nutrition: Goal: Adequate nutrition will be maintained Outcome: Not Applicable   Problem: Elimination: Goal: Will not experience complications related to bowel motility Outcome: Not Applicable   Cindy S. Loreli BSN, RN, [employer], CCRN 10/13/2023 4:39 AM

## 2023-10-13 NOTE — TOC Progression Note (Signed)
 Transition of Care [facility]) - Progression Note    Patient Details  Name: April [name] MRN: [id number] Date of Birth: [birth year]  [facility] CM/SW Contact  Jon ONEIDA Anon, RN Phone Number: 10/13/2023, 11:29 AM  Clinical Narrative:    Pt is comfort care at this time. Anticipating hospital death. There are no further IP CM needs at this time. IP CM will sign off.   Expected Discharge Plan: Home w Hospice Care Barriers to Discharge: No Barriers Identified, Continued Medical Work up               Expected Discharge Plan and Services In-house Referral: Hospice / Palliative Care Discharge Planning Services: CM Consult Post Acute Care Choice: Hospice Living arrangements for the past 2 months: Single Family Home                 DME Arranged: N/A DME Agency: NA       HH Arranged: NA HH Agency: Other - See comment Photographer)         Social Drivers of Health (SDOH) Interventions SDOH Screenings   Food Insecurity: No Food Insecurity (10/03/2023)  Housing: Low Risk  (10/03/2023)  Transportation Needs: No Transportation Needs (10/03/2023)  Utilities: Not At Risk (10/03/2023)  Alcohol Screen: Low Risk  (11/11/2022)  Depression (PHQ2-9): Low Risk  (09/30/2023)  Financial Resource Strain: Low Risk  (11/11/2022)  Physical Activity: Inactive (11/11/2022)  Social Connections: Socially Isolated (01/24/2023)  Stress: No Stress Concern Present (11/11/2022)  Tobacco Use: Medium Risk (10/04/2023)  Health Literacy: Adequate Health Literacy (11/11/2022)    Readmission Risk Interventions    10/05/2023   11:32 AM 08/12/2023    1:51 PM 06/23/2023    3:12 PM  Readmission Risk Prevention Plan  Transportation Screening Complete Complete Complete  Medication Review Oceanographer) Complete Complete Complete  PCP or Specialist appointment within 3-5 days of discharge Complete Complete Complete  HRI or Home Care Consult Complete Complete Complete   SW Recovery Care/Counseling Consult Complete Complete Complete  Palliative Care Screening Complete Complete Not Applicable  Skilled Nursing Facility Not Applicable Not Applicable Not Applicable

## 2023-10-14 ENCOUNTER — Other Ambulatory Visit

## 2023-10-14 ENCOUNTER — Inpatient Hospital Stay: Admitting: Oncology

## 2023-10-14 ENCOUNTER — Ambulatory Visit

## 2023-10-14 ENCOUNTER — Inpatient Hospital Stay

## 2023-10-14 ENCOUNTER — Ambulatory Visit: Admitting: Oncology

## 2023-10-14 DIAGNOSIS — R109 Unspecified abdominal pain: Secondary | ICD-10-CM | POA: Diagnosis not present

## 2023-10-14 DEATH — deceased

## 2023-10-15 ENCOUNTER — Other Ambulatory Visit

## 2023-10-15 ENCOUNTER — Ambulatory Visit: Admitting: Oncology

## 2023-10-15 ENCOUNTER — Inpatient Hospital Stay

## 2023-10-16 ENCOUNTER — Encounter

## 2023-10-16 ENCOUNTER — Ambulatory Visit

## 2023-10-17 ENCOUNTER — Encounter

## 2023-10-19 ENCOUNTER — Ambulatory Visit (HOSPITAL_COMMUNITY)

## 2023-10-19 ENCOUNTER — Inpatient Hospital Stay (HOSPITAL_COMMUNITY): Admit: 2023-10-19

## 2023-10-19 ENCOUNTER — Ambulatory Visit (HOSPITAL_COMMUNITY): Admit: 2023-10-19 | Admitting: Interventional Radiology

## 2023-10-19 SURGERY — RADIOLOGY WITH ANESTHESIA
Anesthesia: Monitor Anesthesia Care

## 2023-10-28 ENCOUNTER — Ambulatory Visit: Admitting: Nurse Practitioner

## 2023-10-28 ENCOUNTER — Other Ambulatory Visit

## 2023-10-28 ENCOUNTER — Ambulatory Visit

## 2023-10-29 ENCOUNTER — Ambulatory Visit

## 2023-10-30 ENCOUNTER — Encounter

## 2023-10-31 ENCOUNTER — Encounter

## 2023-11-11 ENCOUNTER — Other Ambulatory Visit

## 2023-11-11 ENCOUNTER — Ambulatory Visit: Admitting: Oncology

## 2023-11-12 ENCOUNTER — Ambulatory Visit

## 2023-11-14 ENCOUNTER — Encounter

## 2023-11-14 NOTE — Progress Notes (Signed)
 Chaplain arrived as pt April [name] was being examined. She had ceased breathing and no pulse detected. Family had reportedly just left 5-10 minutes prior.  Chaplains remain available for family/staff support when family arrives again.

## 2023-11-14 NOTE — Plan of Care (Signed)
  Problem: Coping: Goal: Level of anxiety will decrease Outcome: Progressing   Problem: Elimination: Goal: Will not experience complications related to urinary retention Outcome: Progressing   Problem: Pain Managment: Goal: General experience of comfort will improve and/or be controlled Outcome: Progressing   Problem: Safety: Goal: Ability to remain free from injury will improve Outcome: Progressing   Problem: Role Relationship: Goal: Family's ability to cope with current situation will improve Outcome: Progressing   Problem: Health Behavior/Discharge Planning: Goal: Ability to manage health-related needs will improve Outcome: Not Progressing   Problem: Clinical Measurements: Goal: Quality of life will improve Outcome: Not Progressing

## 2023-11-14 NOTE — Progress Notes (Signed)
 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  She continued to have persistent nausea and vomiting, uncontrolled pain.  Most recent chemotherapy on 10/01/2023.   Patient has had multiple hospitalization throughout the year with similar presentation, palliative medicine and oncology consulted.  Patient has transition to comfort care.    Assessment & Plan:   Principal Problem:   Intractable abdominal pain Active Problems:   Protein-calorie malnutrition, severe   Hypertension   Colorectal cancer, stage IV (HCC)   Nausea & vomiting   Goals of care, counseling/discussion   Dehydration   Acute prerenal azotemia   History of essential hypertension   Anemia of chronic disease   DNR (do not resuscitate)   Concern about end of life   Neoplasm related pain   Palliative care patient   Pain of metastatic malignancy   Need for emotional support   1-Intractable abdominal pain Acute on chronic pain related to malignancy Stage IV colon cancer with peritoneal carcinomatosis - Patient readmitted for uncontrolled pain, similar to previous presentation. - Patient started on Dilaudid  drip, transition to comfort care. Expect Hospital death   Nausea vomiting, severe constipation: - Treated with Robby tried - Evaluated by IR, not a candidate for venting PEG tube due to anatomy - On NG tube with continuous suctioning Transition to comfort care.  Hypertensive urgency - Clonidine  patch  Transaminases the setting of his stage IV colon cancer - Due to liver  metastasis      Estimated body mass index is 20.47 kg/m as calculated from the following:   Height as of this encounter: 5' 4 November 04, 2023 m).   Weight as of this encounter: 54.1 kg.   DVT prophylaxis: Comfort care Code Status: DNR comfort Family Communication: Daughter at bedside Disposition Plan:  Status is: Inpatient Remains inpatient appropriate because: Management end of life symptoms, anticipate hospital death    Consultants:  Oncology IR Palliative care    Antimicrobials:    Subjective: Patient is lethargic appears comfortable  Objective: Vitals:   04-Nov-2023 0200 11-04-2023 0300 Nov 04, 2023 0400 04-Nov-2023 0500  BP:      Pulse:      Resp: 11 12 12 15   Temp:      TempSrc:      SpO2:      Weight:      Height:        Intake/Output Summary (Last 24 hours) at 2023-11-04 0709 Last data filed at 11/04/23 0530 Gross per 24 hour  Intake 207.82 ml  Output 1300 ml  Net -1092.18 ml   Filed Weights   10/03/23 2300 10/04/23 0435 10/05/23 0413  Weight: 53.4 kg 53.4 kg 54.1 kg    Examination:  General exam: Lethargic Respiratory system: decreased breath sounds Cardiovascular system: S1 & S2 heard, RRR. Gastrointestinal system: BS present, guarding Central nervous system: Lethargic Extremities: No edema  Data Reviewed: I have personally reviewed following labs and imaging studies  CBC: Recent Labs  Lab 10/09/23 0531  WBC 10.3  HGB 9.9*  HCT 31.8*  MCV 90.6  PLT 349   Basic Metabolic Panel: Recent Labs  Lab 10/09/23 0531  NA 138  K 3.5  CL 97*  CO2 30  GLUCOSE 85  BUN 9  CREATININE 0.62  CALCIUM  8.9  PHOS 2.7   GFR: Estimated Creatinine Clearance: 72.6 mL/min (by C-G formula based on SCr of 0.62 mg/dL). Liver Function Tests: Recent Labs  Lab 10/09/23 0531  ALBUMIN 3.2*   No results for input(s): LIPASE, AMYLASE in the last 168 hours. No results for input(s): AMMONIA in the last 168 hours. Coagulation Profile: No results for  input(s): INR, PROTIME in the last 168 hours. Cardiac Enzymes: No results for input(s): CKTOTAL, CKMB, CKMBINDEX, TROPONINI in the last 168 hours. BNP (last 3 results) No results for input(s): PROBNP in the last 8760 hours. HbA1C: No results for input(s): HGBA1C in the last 72 hours. CBG: No results for input(s): GLUCAP in the last 168 hours. Lipid Profile: No results for input(s): CHOL, HDL, LDLCALC, TRIG, CHOLHDL, LDLDIRECT in the last 72 hours. Thyroid Function Tests: No results for input(s): TSH, T4TOTAL, FREET4, T3FREE, THYROIDAB in the last 72 hours. Anemia Panel: No results for input(s): VITAMINB12, FOLATE, FERRITIN, TIBC, IRON, RETICCTPCT in the last 72 hours. Sepsis Labs: No results for input(s): PROCALCITON, LATICACIDVEN in the last 168 hours.  No results found for this or any previous visit (from the past 240 hours).       Radiology Studies: No results found.      Scheduled Meds:  cloNIDine   0.3 mg Transdermal Weekly   fentaNYL   2 patch Transdermal Q72H   LORazepam   1 mg Intravenous Q4H   sodium chloride  flush  10-40 mL Intracatheter Q12H   Continuous Infusions:  HYDROmorphone  21 mg/hr (11-02-23 0247)   ondansetron  (ZOFRAN ) IV Stopped (10/12/23 2254)     LOS: 10 days    Time spent: 35 Minutes.     Owen DELENA Lore, MD [facility]   If 7PM-7AM, please contact night-coverage [email or web address]  11/02/2023, 7:09 AM

## 2023-11-14 NOTE — Death Summary Note (Signed)
 DEATH SUMMARY   Patient Details  Name: April [name] MRN: [id number] DOB: [birth year] ERE:Zjmob, Camie BRAVO, NP Admission/Discharge Information   Admit Date:  10/25/23  Date of Death:  11/05/23  Time of Death:  14:02  Length of Stay: 10   Principle Cause of death: Stage IV colon cancer.   Hospital Diagnoses: Principal Problem:   Intractable abdominal pain Active Problems:   Protein-calorie malnutrition, severe   Hypertension   Colorectal cancer, stage IV (HCC)   Nausea & vomiting   Goals of care, counseling/discussion   Dehydration   Acute prerenal azotemia   History of essential hypertension   Anemia of chronic disease   DNR (do not resuscitate)   Concern about end of life   Neoplasm related pain   Palliative care patient   Pain of metastatic malignancy   Need for emotional support   Hospital Course: 48 year old with past medical history significant for stage IV colon cancer with peritoneal carcinomatosis and liver mets, malignant biliary stricture status post ERCP with a stent placement 02/26/2023, PV thrombus, ACD, 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  She continued to have persistent nausea and vomiting, uncontrolled pain.  Most recent chemotherapy on 10/01/2023.    Patient has had multiple hospitalization throughout the year with similar presentation, palliative medicine and oncology consulted.  Patient has transition to comfort care.  Assessment and Plan: 1-Intractable abdominal pain Acute on chronic pain related to malignancy Stage IV colon cancer with peritoneal carcinomatosis - Patient readmitted for uncontrolled pain, similar to previous presentation. - Patient started on Dilaudid  drip, transition to comfort care. Expect Hospital death, patient died at 14:02 under comfort care. Family was informed.      Nausea vomiting, severe constipation: - Treated  with Robby tried - Evaluated by IR, not a candidate for venting PEG tube due to anatomy - On NG tube with continuous suctioning Transition to comfort care.   Hypertensive urgency - Clonidine  patch   Transaminases the setting of his stage IV colon cancer - Due to liver metastasis         Procedures: none  Consultations: Oncology, Palliative  The results of significant diagnostics from this hospitalization (including imaging, microbiology, ancillary and laboratory) are listed below for reference.   Significant Diagnostic Studies: DG Abd 1 View Result Date: 10/11/2023 EXAM: 1 VIEW XRAY OF THE ABDOMEN 10/11/2023 07:45:00 AM COMPARISON: 10/05/2023 CLINICAL HISTORY: Pt reported a sharp pain around her gastric tube and nausea FINDINGS: LINES, TUBES AND DEVICES: Enteric tube in place with tip and side hole overlying the stomach region. Biliary stent noted. BOWEL: Multiple dilated small bowel loops in central abdomen, increased since prior study. SOFT TISSUES: Bilateral nephrolithiasis. BONES: No acute osseous abnormality. IMPRESSION: 1. Multiple dilated small bowel loops in the central abdomen, increased from prior, concerning for small bowel obstruction vs. ileus. 2. Enteric tube with tip and side hole projecting over the stomach; biliary stent in place. 3. Bilateral nephrolithiasis. Electronically signed by: Waddell Calk MD 10/11/2023 09:12 AM EDT RP Workstation: [id number]   DG Abd 1 View Result Date: 10/05/2023 CLINICAL DATA:  NG tube placement EXAM: ABDOMEN - 1 VIEW COMPARISON:  Same day abdominal radiograph and prior studies FINDINGS: Interval placement of enteric tube with tip in side hole projecting over the body of the stomach. Paucity of visualized bowel gas limits evaluation. Partially imaged biliary stent. Bilateral nephrolithiasis. IMPRESSION: NG tube in place. Electronically Signed   By: Michaeline  Patel M.D.   On: 10/05/2023 15:22   DG Abd 1 View Result Date: 10/05/2023 CLINICAL  DATA:  Abdominal pain, colon cancer EXAM: ABDOMEN - 1 VIEW COMPARISON:  None Available. FINDINGS: Paucity of bowel gas. Large stool throughout the colon. Bilateral renal stones. Biliary stents, stable to prior. No acute osseous abnormality. Pelvic bones are partially visualized. IMPRESSION: Redemonstration of biliary stents. Bilateral nephrolithiasis. Paucity of bowel gas. Electronically Signed   By: Megan  Zare M.D.   On: 10/05/2023 13:59   CT ABDOMEN PELVIS W CONTRAST Result Date: 10/03/2023 CLINICAL DATA:  Abdominal pain with nausea and vomiting. History of stage IV colon cancer. Last treatment 10/01/2023. EXAM: CT ABDOMEN AND PELVIS WITH CONTRAST TECHNIQUE: Multidetector CT imaging of the abdomen and pelvis was performed using the standard protocol following bolus administration of intravenous contrast. RADIATION DOSE REDUCTION: This exam was performed according to the departmental dose-optimization program which includes automated exposure control, adjustment of the mA and/or kV according to patient size and/or use of iterative reconstruction technique. CONTRAST:  OMNIPAQUE  IOHEXOL  300 MG/ML  SOLN COMPARISON:  09/20/2023, 08/02/2023 FINDINGS: Lower chest: Stable 6 mm nodule over the medial left lower lobe. Mild nodularity along the right diaphragmatic surface unchanged. Hepatobiliary: Numerous liver metastases most prominent over the right lobe with mild interval progression. Gallbladder unremarkable. Biliary stent unchanged. Stable dilatation of the central patent ducts. Pancreas: Normal. Spleen: Normal. Adrenals/Urinary Tract: Adrenal glands are normal. Kidneys are normal size with continued evidence bilateral nephrolithiasis as well as stable dilatation of the upper pole left intrarenal collecting system. Stable left renal cyst. Stable dilatation of the left ureter. Right ureter and bladder are unremarkable. Stomach/Bowel: Stomach and small bowel is unremarkable. Appendix not well visualized.  Essentially stable ill-defined soft tissue density over the rectosigmoid junction compatible with known colon cancer. Moderate fecal retention throughout the colon. Vascular/Lymphatic: Abdominal aorta is normal in caliber. Remaining vascular structures are unremarkable. Stable mild adenopathy over the gastrohepatic ligament. No other significant adenopathy identified. Reproductive: Uterus difficult to visualize. Oval simple cystic collection in the left pelvis unchanged measuring 5.6 x 6.5 cm. Other: Minimal free fluid most notable over the left upper quadrant. No significant change in bilateral anterior abdominal omental caking. Musculoskeletal: Unchanged. IMPRESSION: 1. No acute findings in the abdomen/pelvis. 2. Essentially stable ill-defined soft tissue density over the rectosigmoid junction compatible with known colon cancer. Numerous liver metastases with mild interval progression. Stable peritoneal metastases/omental caking. Stable minimal adenopathy over the gastrohepatic ligament. 3. Stable bilateral nephrolithiasis with stable dilatation of the upper pole left intrarenal collecting system and left ureter. 4. Stable 6 mm nodule over the medial left lower lobe. Metastatic disease is possible as recommend attention on follow-up. 5. Stable 5.6 x 6.5 cm simple cystic collection in the left pelvis. Possible loculated fluid versus left ovarian cyst or cystic peritoneal metastasis. 6. Stable minimal free fluid most notable over the left upper quadrant. Electronically Signed   By: Toribio Agreste M.D.   On: 10/03/2023 18:11   DG Fluoro Rm 1-60 Min - No Report Result Date: 09/23/2023 Fluoroscopy was utilized by the requesting physician.  No radiographic interpretation.   DG FL GUIDED LUMBAR PUNCTURE Result Date: 09/23/2023 CLINICAL DATA:  Provided history: Abdominal pain. Additional history: Request received for fluoroscopically-guided lumbar puncture with intrathecal morphine  injection (assessment prior to  intrathecal pain pump placement). EXAM: DIAGNOSTIC LUMBAR PUNCTURE UNDER FLUOROSCOPIC GUIDANCE COMPARISON:  CT abdomen/pelvis 09/20/2023. FLUOROSCOPY: Radiation Exposure Index (as provided by the fluoroscopic device): 19.30 mGy Kerma PROCEDURE: Informed consent  was obtained from the patient prior to the procedure. This process included a discussion of procedure risk. Time-out was performed. The patient was positioned prone on the fluoroscopy table. An appropriate skin entry site was determined under fluoroscopy and marked. The operator donned sterile gloves and a mask. The lower back was prepped and draped in the usual sterile fashion. Local anesthesia was provided with 1% lidocaine . Under intermittent fluoroscopy, lumbar puncture was performed at the L4-L5 level using a 20 gauge spinal needle with return of clear/colorless CSF. An image was saved and sent to PACS. Subsequently, 0.5 mg (1 mL) of morphine  sulfate was injected into the thecal sac. The needle was then removed from the patient, and a dressing was applied at the skin entry site. The patient tolerated the procedure well and no immediate post-procedure complication was apparent. IMPRESSION: 1. Technically successful fluoroscopically-guided L4-L5 lumbar puncture with intrathecal morphine  injection, as described. 2. No immediate post-procedure complication. Electronically Signed   By: Rockey Childs D.O.   On: 09/23/2023 15:30   CT ABDOMEN PELVIS W CONTRAST Result Date: 09/20/2023 CLINICAL DATA:  Abdominal pain, history of metastatic colon cancer, nausea and vomiting EXAM: CT ABDOMEN AND PELVIS WITH CONTRAST TECHNIQUE: Multidetector CT imaging of the abdomen and pelvis was performed using the standard protocol following bolus administration of intravenous contrast. RADIATION DOSE REDUCTION: This exam was performed according to the departmental dose-optimization program which includes automated exposure control, adjustment of the mA and/or kV according to  patient size and/or use of iterative reconstruction technique. CONTRAST:  OMNIPAQUE  IOHEXOL  300 MG/ML  SOLN COMPARISON:  08/02/2023 FINDINGS: Lower chest: No acute pleural or parenchymal lung disease. Hepatobiliary: Marked heterogeneity throughout the right lobe liver compatible with known diffuse hepatic metastases. The index lesion within the right lobe liver measured previously is more difficult to measure discretely on this exam, with subjective increase in the number of hypodense lesions consistent with progressive metastases. Persistent indwelling biliary duct stents, with no change in the intrahepatic biliary duct dilation seen on prior study. Pancreas: Unremarkable. No pancreatic ductal dilatation or surrounding inflammatory changes. Spleen: Normal in size without focal abnormality. Adrenals/Urinary Tract: Stable bilateral nonobstructing renal calculi measuring up to 13 mm on the left and 9 mm on the right. Interval development of mild left hydronephrosis and hydroureter, without clear cause for obstruction identified. The adrenals are unremarkable. Bladder is moderately distended without filling defect. Stomach/Bowel: No bowel obstruction or ileus. The ill-defined soft tissue mass along the mid rectum is again identified, difficult to clearly visualize due to loss of fat plane between the mass and the posterior wall the uterus, measuring up to 5.4 x 3.0 cm reference image 60/2. No evidence of acute bowel inflammation. Vascular/Lymphatic: No significant vascular findings. No discrete adenopathy. Reproductive: Uterus is atrophic. There is a 6.8 x 5.7 cm fluid collection within the region of the left adnexa, unchanged since prior exam. It is unclear whether this reflects loculated fluid, peritoneal metastasis, or a left adnexal cyst. Other: Small volume abdominal ascites. Omental caking is again noted, most pronounced in the right upper quadrant, not appreciably changed. No free intraperitoneal gas. No  abdominal wall hernia. Musculoskeletal: No acute or destructive bony abnormalities. Reconstructed images demonstrate no additional findings. IMPRESSION: 1. Grossly stable soft tissue mass within the mid rectum, difficult to measure accurately given loss of normal fat plane between the mass and the posterior wall the uterus, compatible with known history of malignancy. 2. Stable cystic structure within the left lower pelvis, either loculated fluid,  cystic peritoneal metastases, or left adnexal cyst. 3. Interval development of mild left hydronephrosis and left hydroureter, without clear cause for obstruction identified. The obstruction could be related to extrinsic mass effect upon the distal left ureter by the cystic structure described above. 4. Stable peritoneal metastases and omental caking, most pronounced in the right upper quadrant. 5. Diffuse metastatic disease throughout the right lobe liver, subjectively worse since prior exam. 6. Small volume ascites. 7. Bilateral nonobstructing renal calculi. Electronically Signed   By: Ozell Daring M.D.   On: 09/20/2023 17:30   DG Chest Port 1 View Result Date: 09/20/2023 CLINICAL DATA:  Generalized abdominal pain, nausea and vomiting, history of metastatic colon cancer EXAM: PORTABLE CHEST 1 VIEW COMPARISON:  06/20/2023 FINDINGS: Single frontal view of the chest demonstrates an unremarkable cardiac silhouette. No airspace disease, effusion, or pneumothorax. Left-sided PICC tip overlies superior vena cava. No acute bony abnormalities. IMPRESSION: 1. No acute intrathoracic process. Electronically Signed   By: Ozell Daring M.D.   On: 09/20/2023 16:24    Microbiology: No results found for this or any previous visit (from the past 240 hours).  Time spent: 45 minutes  Signed: Owen DELENA Lore, MD 2023/11/12

## 2023-11-14 NOTE — Progress Notes (Signed)
 Daily Progress Note   Patient Name: April [name]       Date: 10-29-23 DOB: 06-10-1975  Age: 48 y.o. MRN#: [id number] Attending Physician: Madelyne Owen LABOR, MD Primary Care Physician: Oris Camie BRAVO, NP Admit Date: 10/03/2023 Length of Stay: 10 days  Reason for Consultation/Follow-up: Establishing goals of care, Non pain symptom management, and Pain control  Subjective:   CC: Patient has continued to have worsening pain though does feel adjustments of medications help.  Following up regarding complex medical decision making and symptom management.  Subjective:  Reviewed EMR including recent hospitalist documentation.  Patient actively dying Essentially unresponsive at present, monitor noted.  Noted palliative medicine team will continue to follow along with patient's medical journey.  Discussed care with IDT this am to assist with symptom management and end-of-life.  Objective:   Vital Signs:  BP (!) 126/91   Pulse (!) 152   Temp 97.9 F (36.6 C) (Oral)   Resp 15   Ht 5' 4 (1.626 m)   Wt 54.1 kg   LMP 01/07/2006   SpO2 (!) 48%   BMI 20.47 kg/m   Physical Exam: General: NAD, lethargic unresponsive, ill-appearing, frail Cardiovascular: Tachycardia noted Respiratory: increased work of breathing noted, not in respiratory distress Neuro: Lethargic unresponsive Monitor noted.   Assessment & Plan:   Assessment: Patient is a 48 y.o. 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 Palliative medicine consulted for medical decision making assistance and symptom management.   Recommendations/Plan: # Complex medical decision making/goals of care:  - Patient was transitioned to full comfort focused care on 10/06/2023.  Patient's medical status has  continued to deteriorate and she developed concern for small bowel obstruction over the weekend despite having NG tube for suction.  Have discussed with patient, patient's mother, and patient's children that patient is needing such high level of symptom management that she cannot return home to receive this level of care and at this time anticipate in-hospital death.  Palliative medicine team continuing to assist with discussions as able and appropriate moving forward.  -  Code Status: Do not attempt resuscitation (DNR) - Comfort care  # Symptom management: Patient is receiving these palliative interventions for symptom management with an intent to provide comfort measures at end of life.   - Pain, severe acute on chronic pain in setting of metastatic colorectal cancer and now comfort focused care   - Continue fentanyl  patch 200 mcg/hr every 72 hours   - Increase IV Dilaudid  continuous infusion to 15-25mg /hr titratable dose with bolus dosing of 10 mg every 15 minute.  Patient has had continued severe pain despite multiple adjustments.         -May need to consider further medications such as propofol  for symptom management at the end of life. Continue to monitor.    - Anxiety   - Continue IV Ativan  1 mg every 4 hours as needed   - Continue IV Ativan  1mg  q 4 hours. Continue to adjust based on patient's symptom burden.   # Psychosocial Support:  - Mother, daughter  # Discharge Planning: Anticipated Hospital Death   Appears unstable for transfer to local hospice facility.   Patient unfortunately has continued to medically deteriorate and based on current evaluation, anticipate in-hospital death.  Less Than 2 Days Before Death In the final  hours, patients exhibit specific clinical signs that indicate the approach of death.  Clinical Signs:  1.Death rattle  Management Techniques: Educate the family about the nature of the sound Reposition the patient to promote drainage Consider using  anticholinergic medications if the patient is suffering.  2.Apnea. Management Techniques:  Educate the family about the possibility of irregular breathing patterns Administer opioids if dyspnea is present.  3.Respiration with mandibular movement  Management Techniques: Educate the family about changes in breathing patterns.  4.Decreased urine output  Management Techniques: Educate the family about the natural decrease in bodily functions.  5.Pulselessness of radial artery  Management Techniques: Educate the family about changes in circulation and perfusion.  6.Inability to close eyelids  Management Techniques: Educate the family about the physical changes in muscle control Use a wet washcloth if eyes are dry or irritated.  7.Grunting of vocal cords  Management Techniques: Educate the family the family about the possibility of vocal cord tension Administer opioids if pain is present.  8.Fever  Management Techniques: Place a cool washcloth on the patient's forehead Remove excess blankets Use a fan Administer acetominophin if necessary   Thank you for allowing the palliative care team to participate in the care Remigio Rosaline Ellen. Okay to transfer out of the ICU to medical floor for ongoing comfort care.  Lonia Serve MD.  Palliative Care Provider PMT # [phone]  If patient remains symptomatic despite maximum doses, please call PMT at [phone] between 0700 and 1900. Outside of these hours, please call attending, as PMT does not have night coverage.  Billing based on MDM: mod  Problems Addressed: One or more chronic illnesses with severe exacerbation, progression, or side effects of treatment.  Risks: Parenteral controlled substances

## 2023-11-14 DEATH — deceased
